# Patient Record
Sex: Male | Born: 1960 | Race: White | Hispanic: No | Marital: Married | Smoking: Former smoker
Health system: Southern US, Community
[De-identification: ages and names within clinical notes are randomized; demographics above are authoritative.]

## PROBLEM LIST (undated history)

## (undated) DIAGNOSIS — R3915 Urgency of urination: Secondary | ICD-10-CM

## (undated) DIAGNOSIS — J690 Pneumonitis due to inhalation of food and vomit: Secondary | ICD-10-CM

## (undated) DIAGNOSIS — Z8673 Personal history of transient ischemic attack (TIA), and cerebral infarction without residual deficits: Secondary | ICD-10-CM

## (undated) DIAGNOSIS — J309 Allergic rhinitis, unspecified: Secondary | ICD-10-CM

## (undated) DIAGNOSIS — Z8619 Personal history of other infectious and parasitic diseases: Secondary | ICD-10-CM

## (undated) DIAGNOSIS — J189 Pneumonia, unspecified organism: Secondary | ICD-10-CM

## (undated) DIAGNOSIS — Z87442 Personal history of urinary calculi: Secondary | ICD-10-CM

## (undated) DIAGNOSIS — K625 Hemorrhage of anus and rectum: Secondary | ICD-10-CM

## (undated) DIAGNOSIS — Z8709 Personal history of other diseases of the respiratory system: Secondary | ICD-10-CM

## (undated) DIAGNOSIS — Z8679 Personal history of other diseases of the circulatory system: Secondary | ICD-10-CM

## (undated) DIAGNOSIS — Y95 Nosocomial condition: Secondary | ICD-10-CM

## (undated) DIAGNOSIS — A0472 Enterocolitis due to Clostridium difficile, not specified as recurrent: Secondary | ICD-10-CM

## (undated) DIAGNOSIS — N200 Calculus of kidney: Secondary | ICD-10-CM

## (undated) DIAGNOSIS — Z86718 Personal history of other venous thrombosis and embolism: Secondary | ICD-10-CM

## (undated) DIAGNOSIS — I480 Paroxysmal atrial fibrillation: Secondary | ICD-10-CM

## (undated) DIAGNOSIS — K529 Noninfective gastroenteritis and colitis, unspecified: Secondary | ICD-10-CM

## (undated) DIAGNOSIS — R188 Other ascites: Secondary | ICD-10-CM

## (undated) DIAGNOSIS — Z87448 Personal history of other diseases of urinary system: Secondary | ICD-10-CM

## (undated) DIAGNOSIS — G7281 Critical illness myopathy: Secondary | ICD-10-CM

## (undated) HISTORY — PX: TRANSTHORACIC ECHOCARDIOGRAM: SHX275

## (undated) HISTORY — PX: OTHER SURGICAL HISTORY: SHX169

## (undated) HISTORY — DX: Noninfective gastroenteritis and colitis, unspecified: K52.9

## (undated) HISTORY — DX: Personal history of transient ischemic attack (TIA), and cerebral infarction without residual deficits: Z86.73

## (undated) HISTORY — DX: Hemorrhage of anus and rectum: K62.5

## (undated) HISTORY — DX: Paroxysmal atrial fibrillation: I48.0

---

## 2001-10-05 ENCOUNTER — Ambulatory Visit (HOSPITAL_COMMUNITY): Admission: RE | Admit: 2001-10-05 | Discharge: 2001-10-05 | Payer: Self-pay | Admitting: Internal Medicine

## 2002-06-13 ENCOUNTER — Ambulatory Visit (HOSPITAL_COMMUNITY): Admission: RE | Admit: 2002-06-13 | Discharge: 2002-06-13 | Payer: Self-pay | Admitting: Internal Medicine

## 2004-08-19 ENCOUNTER — Emergency Department (HOSPITAL_COMMUNITY): Admission: EM | Admit: 2004-08-19 | Discharge: 2004-08-19 | Payer: Self-pay | Admitting: *Deleted

## 2004-09-06 ENCOUNTER — Ambulatory Visit: Payer: Self-pay | Admitting: Internal Medicine

## 2005-10-19 ENCOUNTER — Ambulatory Visit: Payer: Self-pay | Admitting: Internal Medicine

## 2005-11-09 ENCOUNTER — Ambulatory Visit: Payer: Self-pay | Admitting: Internal Medicine

## 2009-10-22 LAB — CBC AND DIFFERENTIAL
HCT: 41 % (ref 41–53)
Hemoglobin: 14.4 g/dL (ref 13.5–17.5)

## 2010-02-01 ENCOUNTER — Ambulatory Visit
Admission: RE | Admit: 2010-02-01 | Discharge: 2010-02-01 | Payer: Self-pay | Source: Home / Self Care | Attending: Internal Medicine | Admitting: Internal Medicine

## 2010-06-11 NOTE — Op Note (Signed)
NAME:  Connor Small, Connor Small                          ACCOUNT NO.:  [id number]   MEDICAL RECORD NO.:  [id number]                   PATIENT TYPE:  AMB   LOCATION:  DAY                                  FACILITY:  [facility]   PHYSICIAN:  Lionel December, M.D.                 DATE OF BIRTH:  04-13-1960   DATE OF PROCEDURE:  06/13/2002  DATE OF DISCHARGE:                                 OPERATIVE REPORT   PROCEDURE:  Flexible sigmoidoscopy.   INDICATIONS FOR PROCEDURE:  The patient is a 50 year old Caucasian male who  presents with a two-month history of bloody diarrhea.  He is suspected to  have colitis.  He did have a colonoscopy in September of 2003 with a two-  week history of hematochezia.  At that time he was having normal stools.  He  had a hyperplastic polyp at the rectosigmoid junction and hemorrhoids.  Otherwise, it was a normal exam.  He was treated with Anusol suppositories,  with resolution of his bleeding.  He has not taken any antibiotics recently.  He requested IV sedation.   The procedure was reviewed with the patient, and informed consent was  obtained.   PREOPERATIVE MEDICATIONS:  Demerol 25 mg IV, Versed 3 mg IV.   INSTRUMENT USED:  The Olympus video system.   FINDINGS:  The procedure was performed in the endoscopy suite.  The  patient's vital signs and O2 saturations were monitored during the procedure  and remained stable.  The patient was placed in the left lateral recumbent  position and rectal examination performed.  No abnormality noted on external  or digital exam.  The scope was placed in the rectum.  The mucosa was noted  to be diffusely friable, erythematous, with multiple ulcers.  These changes  were felt to be typical of UC.  Similar changes were noted in the sigmoid  colon, with demarcation at 40 cm from the anal margin.  The mucosa above  that was normal.  Multiple pictures were taken for the record.  Stool  samples were obtained and sent to the lab for culture  and O&P.  A biopsy was  taken from the sigmoid colon and rectum, and the endoscope was withdrawn.  The patient tolerated the procedure well.   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    RECOMMENDATIONS:  Will start him on Rowasa enema, one per rectum at bedtime  for a total of 30 days.  I will be calling with the results of the biopsy  and stool studies.  At that time, we will start him on oral mesalamine.  Lionel December, M.D.    NR/MEDQ  D:  06/13/2002  T:  06/13/2002  Job:  [id number]   cc:   Angus G. Renard Matter, M.D.  [address]  [place]  [place] [address]  Fax: [phone]

## 2010-09-20 ENCOUNTER — Encounter (INDEPENDENT_AMBULATORY_CARE_PROVIDER_SITE_OTHER): Payer: Self-pay | Admitting: *Deleted

## 2010-09-20 ENCOUNTER — Encounter (INDEPENDENT_AMBULATORY_CARE_PROVIDER_SITE_OTHER): Payer: Self-pay

## 2010-11-06 ENCOUNTER — Emergency Department (HOSPITAL_COMMUNITY)
Admission: EM | Admit: 2010-11-06 | Discharge: 2010-11-06 | Disposition: A | Discharge status: Other Acute Inpatient Hospital | Payer: BC Managed Care – PPO | Attending: Emergency Medicine | Admitting: Emergency Medicine

## 2010-11-06 ENCOUNTER — Encounter (HOSPITAL_COMMUNITY): Payer: Self-pay | Admitting: *Deleted

## 2010-11-06 ENCOUNTER — Emergency Department (HOSPITAL_COMMUNITY): Payer: BC Managed Care – PPO

## 2010-11-06 DIAGNOSIS — K631 Perforation of intestine (nontraumatic): Secondary | ICD-10-CM | POA: Insufficient documentation

## 2010-11-06 DIAGNOSIS — R197 Diarrhea, unspecified: Secondary | ICD-10-CM | POA: Insufficient documentation

## 2010-11-06 DIAGNOSIS — K5190 Ulcerative colitis, unspecified, without complications: Secondary | ICD-10-CM | POA: Insufficient documentation

## 2010-11-06 DIAGNOSIS — R231 Pallor: Secondary | ICD-10-CM | POA: Insufficient documentation

## 2010-11-06 LAB — URINALYSIS, ROUTINE W REFLEX MICROSCOPIC
Leukocytes, UA: NEGATIVE
Nitrite: NEGATIVE
Protein, ur: 100 mg/dL — AB
Specific Gravity, Urine: >1.03 — ABNORMAL HIGH (ref 1.005–1.030)
Urobilinogen, UA: 2 mg/dL — ABNORMAL HIGH (ref 0.0–1.0)

## 2010-11-06 LAB — COMPREHENSIVE METABOLIC PANEL
ALT: 16 U/L (ref 0–53)
AST: 19 U/L (ref 0–37)
Albumin: 3.4 g/dL — ABNORMAL LOW (ref 3.5–5.2)
Alkaline Phosphatase: 109 U/L (ref 39–117)
BUN: 18 mg/dL (ref 6–23)
Chloride: 102 mEq/L (ref 96–112)
Potassium: 3.7 mEq/L (ref 3.5–5.1)
Sodium: 138 mEq/L (ref 135–145)
Total Protein: 6.8 g/dL (ref 6.0–8.3)

## 2010-11-06 LAB — CBC
MCHC: 34.4 g/dL (ref 30.0–36.0)
Platelets: 143 10*3/uL — ABNORMAL LOW (ref 150–400)
RDW: 13.2 % (ref 11.5–15.5)
WBC: 12.7 10*3/uL — ABNORMAL HIGH (ref 4.0–10.5)

## 2010-11-06 LAB — URINE MICROSCOPIC-ADD ON

## 2010-11-06 MED ORDER — CIPROFLOXACIN IN D5W 400 MG/200ML IV SOLN
400.0000 mg | Freq: Once | INTRAVENOUS | Status: AC
  Administered 2010-11-06: 400 mg via INTRAVENOUS
  Filled 2010-11-06: qty 200

## 2010-11-06 MED ORDER — MORPHINE SULFATE 4 MG/ML IJ SOLN
4.0000 mg | Freq: Once | INTRAMUSCULAR | Status: AC
  Administered 2010-11-06: 4 mg via INTRAVENOUS
  Filled 2010-11-06: qty 1

## 2010-11-06 MED ORDER — ACETAMINOPHEN 10 MG/ML IV SOLN: 1000.0000 mg | Freq: Once | INTRAVENOUS | Status: DC

## 2010-11-06 MED ORDER — SODIUM CHLORIDE 0.9 % IV BOLUS (SEPSIS)
1000.0000 mL | Freq: Once | INTRAVENOUS | Status: AC
  Administered 2010-11-06: 1000 mL via INTRAVENOUS

## 2010-11-06 MED ORDER — ACETAMINOPHEN 10 MG/ML IV SOLN
1000.0000 mg | Freq: Four times a day (QID) | INTRAVENOUS | Status: DC
  Administered 2010-11-06: 1000 mg via INTRAVENOUS

## 2010-11-06 MED ORDER — ONDANSETRON HCL 4 MG/2ML IJ SOLN
4.0000 mg | Freq: Once | INTRAMUSCULAR | Status: AC
  Administered 2010-11-06: 4 mg via INTRAVENOUS
  Filled 2010-11-06: qty 2

## 2010-11-06 MED ORDER — ACETAMINOPHEN 10 MG/ML IV SOLN
INTRAVENOUS | Status: AC
  Filled 2010-11-06: qty 100

## 2010-11-06 MED ORDER — METRONIDAZOLE IN NACL 5-0.79 MG/ML-% IV SOLN
500.0000 mg | Freq: Once | INTRAVENOUS | Status: AC
  Administered 2010-11-06: 500 mg via INTRAVENOUS
  Filled 2010-11-06: qty 100

## 2010-11-06 MED ORDER — MORPHINE SULFATE 4 MG/ML IJ SOLN
4.0000 mg | Freq: Once | INTRAMUSCULAR | Status: AC
  Administered 2010-11-06: 4 mg via INTRAVENOUS

## 2010-11-06 MED ORDER — MORPHINE SULFATE 4 MG/ML IJ SOLN
4.0000 mg | Freq: Once | INTRAMUSCULAR | Status: DC
  Filled 2010-11-06: qty 1

## 2010-11-06 MED ORDER — IOHEXOL 300 MG/ML  SOLN
100.0000 mL | Freq: Once | INTRAMUSCULAR | Status: AC | PRN
  Administered 2010-11-06: 100 mL via INTRAVENOUS

## 2010-11-06 MED ORDER — PROMETHAZINE HCL 25 MG/ML IJ SOLN
INTRAMUSCULAR | Status: AC
  Administered 2010-11-06: 25 mg
  Filled 2010-11-06: qty 1

## 2010-11-06 MED ORDER — SODIUM CHLORIDE 0.9 % IV SOLN
INTRAVENOUS | Status: DC
  Administered 2010-11-06: 12:00:00 via INTRAVENOUS

## 2010-11-06 NOTE — ED Notes (Signed)
Patient returned from CT at this time.

## 2010-11-06 NOTE — ED Notes (Signed)
Patient c/o feeling hot. Temperature 100.3 orally. Dr Tomi Bamberger made aware.

## 2010-11-06 NOTE — ED Notes (Signed)
Pt c/o lower abdominal pain and blood in his urine since Thursday night. Pt states that he had a fever Thursday night but did not take it. Pt denies dysuria or frequency. Pt also c/o vomiting on Thursday but none since.

## 2010-11-06 NOTE — ED Provider Notes (Cosign Needed)
History     CSN: [id number] Arrival date & time: 11/06/2010 10:15 AM  Chief Complaint  Patient presents with  . Abdominal Pain    (Consider location/radiation/quality/duration/timing/severity/associated sxs/prior treatment) Patient is a 50 y.o. male presenting with abdominal pain.  Abdominal Pain The primary symptoms of the illness include abdominal pain. Dysuria:  he sttes he has seen blood in his urine. He denies pain in his flank.   patient relates he has a history of colitis but when he has a flareup that is usually associated with pain in his rectal area. He states he's never had abdominal pain with it. He states his last attack was about a year ago. He also states he's never had to be admitted to the hospital for it. Patient states he started getting a different abdominal pain 2 evenings ago he has pain in his lower abdomen that he described initially as a soreness. He has had nausea of vomiting and now is having dry heaves. His wife states he was having chills and he felt hot but did not check his temperature. He denies diarrhea and states of his colitis he usually had bloody diarrhea. He has had loss of appetite and states that walking or movement makes the pain worse. He states nothing makes it feel better.  States he has seen blood in his urine. He denies pain in his flank.   Past Medical History  Diagnosis Date  . Chronic diarrhea   . Rectal bleed   . Ulcerative colitis   . Hemorrhoids     No PCP Dr Karilyn Cota, GI  Past Surgical History  Procedure Date  . Sigmoidoscopy [birth year]31-1960  . Colonoscopy 9/03    Family History  Problem Relation Age of Onset  . Cancer Sister     History  Substance Use Topics  . Smoking status: Former Games developer  . Smokeless tobacco: Not on file  . Alcohol Use: No   employed Lives with spouse   Review of Systems  Gastrointestinal: Positive for abdominal pain.  Genitourinary: Dysuria:  he sttes he has seen blood in his urine. He denies pain  in his flank.  All other systems reviewed and are negative.    Allergies  Penicillins  Home Medications   Current Outpatient Rx  Name Route Sig Dispense Refill  . MESALAMINE 800 MG PO TBEC Oral Take 2 tablets by mouth 2 (two) times daily.       BP 122/76  Pulse 101  Temp(Src) 98.9 F (37.2 C) (Oral)  Resp 18  Ht 5' 9.5" (1.765 m)  Wt 197 lb (89.359 kg)  BMI 28.67 kg/m2  SpO2 99%  Physical Exam  Vitals reviewed. Constitutional: He is oriented to person, place, and time. He appears well-developed and well-nourished.  HENT:  Head: Normocephalic and atraumatic.  Eyes: Conjunctivae and EOM are normal. Pupils are equal, round, and reactive to light.  Neck: Normal range of motion. Neck supple.  Cardiovascular: Normal rate, regular rhythm and normal heart sounds.   Pulmonary/Chest: Effort normal and breath sounds normal.  Abdominal: Soft. Bowel sounds are normal. There is tenderness.       Patient has diffuse tenderness in his lower abdomen there appears to be most tender in his right lower quadrant. He does not haveRovsky's sign. He has some guarding but does not have rebound yet.   Musculoskeletal: Normal range of motion.  Neurological: He is alert and oriented to person, place, and time. He has normal reflexes.  Skin: Skin is warm and dry. There  is pallor.  Psychiatric: He has a normal mood and affect. His behavior is normal. Thought content normal.    ED Course  Procedures (including critical care time)   Results for orders placed during the hospital encounter of 11/06/10  URINALYSIS, ROUTINE W REFLEX MICROSCOPIC      Component Value Range   Color, Urine AMBER (*) YELLOW    Appearance CLOUDY (*) CLEAR    Specific Gravity, Urine >1.030 (*) 1.005 - 1.030    pH 6.0  5.0 - 8.0    Glucose, UA NEGATIVE  NEGATIVE (mg/dL)   Hgb urine dipstick LARGE (*) NEGATIVE    Bilirubin Urine SMALL (*) NEGATIVE    Ketones, ur 40 (*) NEGATIVE (mg/dL)   Protein, ur 409 (*) NEGATIVE  (mg/dL)   Urobilinogen, UA 2.0 (*) 0.0 - 1.0 (mg/dL)   Nitrite NEGATIVE  NEGATIVE    Leukocytes, UA NEGATIVE  NEGATIVE   CBC      Component Value Range   WBC 12.7 (*) 4.0 - 10.5 (K/uL)   RBC 4.87  4.22 - 5.81 (MIL/uL)   Hemoglobin 15.8  13.0 - 17.0 (g/dL)   HCT 81.1  91.4 - 78.2 (%)   MCV 94.3  78.0 - 100.0 (fL)   MCH 32.4  26.0 - 34.0 (pg)   MCHC 34.4  30.0 - 36.0 (g/dL)   RDW 95.6  21.3 - 08.6 (%)   Platelets 143 (*) 150 - 400 (K/uL)  URINE MICROSCOPIC-ADD ON      Component Value Range   Squamous Epithelial / LPF RARE  RARE    WBC, UA 0-2  <3 (WBC/hpf)   RBC / HPF 21-50  <3 (RBC/hpf)   Bacteria, UA FEW (*) RARE    Ct Abdomen Pelvis W Contrast  11/06/2010  *RADIOLOGY REPORT*  Clinical Data: Lower abdominal pain.  Appendicitis.  CT ABDOMEN AND PELVIS WITH CONTRAST  Technique:  Multidetector CT imaging of the abdomen and pelvis was performed following the standard protocol during bolus administration of intravenous contrast.  Contrast: OMNIPAQUE IOHEXOL 300 MG/ML IV SOLN  Comparison: None  Findings: Atelectasis noted in both lung bases.  There is no focal liver abnormality.  The gallbladder appears normal.  No biliary dilatation.  The pancreas appears normal.  The spleen appears normal.  Both adrenal glands are normal.  Pancreas is unremarkable.  Normal appearance of the right kidney.  Left renal cysts.  No pathologically enlarged upper abdominal lymph nodes.  There is no enlarged pelvic or inguinal lymph nodes.  The stomach appears normal.  The small bowel loops are prominent measuring up to 3 cm.  The appendix is identified and appears normal.  There is a large extraluminal collection of gas and stool adjacent to the rectum which this likely the site of bowel perforation. This measures 4.0 x 3.6 x 4.8 cm.  A moderate to marked amount of free intraperitoneal air is identified within the upper abdomen which likely originates from the source.  There is a small amount of free fluid  identified within the pelvis.  IMPRESSION:  1.  Large extraluminal collection of gas and stool adjacent to the rectum consistent with perforation. 2.  Free intraperitoneal air throughout the abdomen and pelvis.  3.  Increased caliber of small bowel loops likely reflecting paralytic ileus.  Critical Value/emergent results were called by telephone at the time of interpretation on 11/06/2010  at 12:07 p.m.  to  Dr. Lynelle Doctor, who verbally acknowledged these results.  Original Report Authenticated By: Simonne Martinet.  Bradly Chris, M.D.   Patient given IV pain and nausea medications he was started on Cipro and Flagyl IV. He developed a temperature while using the emergency department he was given IV ofirmev (intravenous acetaminophen)  since we did not want to give him anything orally or rectally.  Impression #1 bowel perforation #2 ulcerative colitis  12:02 Dr Bradly Chris, radiology called CT results, has free air, normal appendix, ? From his rectum  12:23 Dr Lovell Sheehan feels he should be referred to [facility] b/o his history of ulcerative colitis.  1:15 Dr Minta Balsam states he has no beds and will not have one soon, suggests trying [facility].   1:36 Dr Luisa Hart, refuses transfer states can be done here.   1:57 Cr Lovell Sheehan, asks to call [facility] and if not call him back  2:44 Mardella Layman, [place] at [facility] states the trauma doctor states to call the transfer center and talk to the ED physcian.   2:47 secretary advised to call [facility] ED for transfer  15:34 Dr Lum Babe, ED Attending accepts in transfer to [facility] 15:38 Dr Drusen, Surgery on call at [facility] given information.   15:56 [facility] states no ambulance available. EMS is here to transport a STEMI, will check with [facility] and if unavailable will call back [facility] to get helicopter transport.   16:05 EMS unable to transport, have called [facility] back to get helicopter.  Patient and wife aware and are agreeable to transfer.   Patient will be sent with copies of his CT scan by disc.  MDM   CRITICAL  CARE Performed by: Devoria Albe L   Total critical care time  60  Critical care time was exclusive of separately billable procedures and treating other patients.  Critical care was necessary to treat or prevent imminent or life-threatening deterioration.  Critical care was time spent personally by me on the following activities: development of treatment plan with patient and/or surrogate as well as nursing, discussions with consultants, evaluation of patient's response to treatment, examination of patient, obtaining history from patient or surrogate, ordering and performing treatments and interventions, ordering and review of laboratory studies, ordering and review of radiographic studies, pulse oximetry and re-evaluation of patient's condition.   Devoria Albe, MD, FACEP      Ward Givens, MD 11/06/10 1545  Ward Givens, MD 11/06/10 1610  Ward Givens, MD 11/06/10 1702  Ward Givens, MD 11/29/10 1537

## 2010-11-06 NOTE — ED Notes (Signed)
Patient provided warm blanket.

## 2010-11-06 NOTE — ED Notes (Signed)
Patient left with flight crew from [facility]. Report given to flight nurse and patient care transferred to their care. Patient left ED in NAD. Report called to [facility] ED as well.

## 2010-11-09 ENCOUNTER — Ambulatory Visit (INDEPENDENT_AMBULATORY_CARE_PROVIDER_SITE_OTHER): Payer: Self-pay | Admitting: Internal Medicine

## 2010-12-06 ENCOUNTER — Ambulatory Visit (INDEPENDENT_AMBULATORY_CARE_PROVIDER_SITE_OTHER): Payer: BC Managed Care – PPO | Admitting: Internal Medicine

## 2010-12-06 ENCOUNTER — Encounter (INDEPENDENT_AMBULATORY_CARE_PROVIDER_SITE_OTHER): Payer: Self-pay | Admitting: Internal Medicine

## 2010-12-06 VITALS — BP 102/70 | HR 68 | Temp 98.7°F | Ht 70.0 in | Wt 190.0 lb

## 2010-12-06 DIAGNOSIS — K5120 Ulcerative (chronic) proctitis without complications: Secondary | ICD-10-CM

## 2010-12-06 DIAGNOSIS — K5190 Ulcerative colitis, unspecified, without complications: Secondary | ICD-10-CM

## 2010-12-06 MED ORDER — MESALAMINE 800 MG PO TBEC: 2.0000 | DELAYED_RELEASE_TABLET | Freq: Two times a day (BID) | ORAL | Status: DC

## 2010-12-06 NOTE — Patient Instructions (Signed)
Continue Asacol HD at current dose.

## 2010-12-06 NOTE — Progress Notes (Signed)
Presenting complaint; followup for ulcerative colitis and recent emergency surgery for colonic perforation. Subjective; patient is 50 year old Caucasian male who has history of distal UC diagnosed in 2004 who was last seen in January 2012 and treated for flareup of ulcerative colitis and responded to 4 weeks of mesalamine enema and a higher dose of Asacol. He was doing fine until the evening of 11/04/2010 and he noted headache and took 2 tablets of Aleve without relief of his headache; he subsequently abdominal pain develop diaphoresis and chills. By the next morning he felt fine and went to work; however later that day his abdominal pain returns with chills and diaphoresis and he was up all night. He came to the emergency room on 11/06/2010 and on workup found to have pneumoperitoneum. Because of his IBD history he was transferred to [facility] and underwent emergency surgery that night. He had resection of segment of the sigmoid colon along with colostomy with uneventful recovery. Patient was told that his colitis was in remission and that cause of colonic perforation was not clear. He has visit planned in January 2013 right 2 takedown of his colostomy. He is getting his strength back. He is able to walk more. He reports no problems with his colostomy he is passing thick brown stool. He denies melena or bleeding. He has noted some rectal discharge but is always clear. He has a good appetite. Current medications. Asacol HD 1.6 g by mouth twice a day. Objective BP 102/70  Pulse 68  Temp 98.7 F (37.1 C)  Ht 5' 10"  (1.778 m)  Wt 190 lb (86.183 kg)  BMI 27.26 kg/m2 Conjunctiva is pink sclera is nonicteric. Oropharyngeal mucosa is normal. No neck masses or thyromegaly noted. Lungs are clear to auscultation. Abdominal exam reveals colostomy in left lower quadrant. There is soft stool in colostomy bag. Well-healed midline scar. Abdomen is soft and nontender without organomegaly or masses. No  peripheral edema or clubbing noted. Assessment Recent emergency surgery for acute abdomen secondary to sigmoid colonic perforation. However no cause of perforation established.  No evidence of active UC or neoplasm. However I have not seen any records from [facility]. Plan 1. New prescription given for Asacol HD 3 months supply with 3 refills. 2. Will request records from [facility]. 3. He should undergo preop colonoscopy for takedown of his colostomy which she can have hair or at [facility]. 4. We will help patient with his disability papers 5. Office visit tentatively planned in 6 months.

## 2010-12-13 ENCOUNTER — Telehealth (INDEPENDENT_AMBULATORY_CARE_PROVIDER_SITE_OTHER): Payer: Self-pay | Admitting: Internal Medicine

## 2010-12-13 NOTE — Telephone Encounter (Signed)
I called patient to let her know that I have reviewed records from [facility]. He had rectal perforation along the anterior wall just above the peritoneal reflection. Path shows perforation but no cause established. He did not have a diverticulum active colitis or tumor. I did call Dr. Payton Doughty  but could not talk with her. Patient should have a colonoscopy prior to take down his colostomy and he would let us know if he'll have in [place] or at [facility] after his next visit with them

## 2011-02-02 ENCOUNTER — Telehealth (INDEPENDENT_AMBULATORY_CARE_PROVIDER_SITE_OTHER): Payer: Self-pay | Admitting: *Deleted

## 2011-02-02 ENCOUNTER — Other Ambulatory Visit (INDEPENDENT_AMBULATORY_CARE_PROVIDER_SITE_OTHER): Payer: Self-pay | Admitting: *Deleted

## 2011-02-02 DIAGNOSIS — K5120 Ulcerative (chronic) proctitis without complications: Secondary | ICD-10-CM

## 2011-02-02 NOTE — Telephone Encounter (Signed)
Requesting MD:     PCP:    Name: Connor Small  DOB: 11/21/1960    Procedure: TCS  Reason/Indication:  Ulcerative colitis--procedure prior to take down of colostomy  Has patient had this procedure before?    If so, when, by whom and where?    Is there a family history of colon cancer?  no  Who?  What age when diagnosed?    Is patient diabetic?   no      Does patient have prosthetic heart valve?  no  Do you have a pacemaker?  no  Has patient had joint replacement within last 12 months?  no  Is patient on Coumadin, Plavix and/or Aspirin? no  Medications: Asachol  Allergies: pcn  Pharmacy:   Medication Adjustment: none  Other:   Procedure date & time: 02/16/11 @ 1:00 (12:00)

## 2011-02-03 NOTE — Telephone Encounter (Signed)
agree

## 2011-02-04 MED ORDER — PEG-KCL-NACL-NASULF-NA ASC-C 100 G PO SOLR: 1.0000 | Freq: Once | ORAL | Status: DC

## 2011-02-04 NOTE — Telephone Encounter (Signed)
agree

## 2011-02-15 ENCOUNTER — Encounter (HOSPITAL_COMMUNITY): Payer: Self-pay | Admitting: Pharmacy Technician

## 2011-02-15 MED ORDER — SODIUM CHLORIDE 0.45 % IV SOLN
Freq: Once | INTRAVENOUS | Status: AC
  Administered 2011-02-16: 1000 mL via INTRAVENOUS

## 2011-02-16 ENCOUNTER — Ambulatory Visit (HOSPITAL_COMMUNITY)
Admission: RE | Admit: 2011-02-16 | Discharge: 2011-02-16 | Disposition: A | Discharge status: Home / Self Care | Payer: BC Managed Care – PPO | Source: Ambulatory Visit | Attending: Internal Medicine | Admitting: Internal Medicine

## 2011-02-16 ENCOUNTER — Other Ambulatory Visit (INDEPENDENT_AMBULATORY_CARE_PROVIDER_SITE_OTHER): Payer: Self-pay | Admitting: Internal Medicine

## 2011-02-16 ENCOUNTER — Encounter (HOSPITAL_COMMUNITY)
Admission: RE | Disposition: A | Discharge status: Home / Self Care | Payer: Self-pay | Source: Ambulatory Visit | Attending: Internal Medicine

## 2011-02-16 ENCOUNTER — Encounter (HOSPITAL_COMMUNITY): Payer: Self-pay | Admitting: *Deleted

## 2011-02-16 DIAGNOSIS — K5190 Ulcerative colitis, unspecified, without complications: Secondary | ICD-10-CM

## 2011-02-16 DIAGNOSIS — K5180 Other ulcerative colitis without complications: Secondary | ICD-10-CM | POA: Insufficient documentation

## 2011-02-16 DIAGNOSIS — Z01818 Encounter for other preprocedural examination: Secondary | ICD-10-CM

## 2011-02-16 DIAGNOSIS — Z933 Colostomy status: Secondary | ICD-10-CM | POA: Insufficient documentation

## 2011-02-16 DIAGNOSIS — D126 Benign neoplasm of colon, unspecified: Secondary | ICD-10-CM

## 2011-02-16 DIAGNOSIS — K6289 Other specified diseases of anus and rectum: Secondary | ICD-10-CM

## 2011-02-16 DIAGNOSIS — Z9049 Acquired absence of other specified parts of digestive tract: Secondary | ICD-10-CM | POA: Insufficient documentation

## 2011-02-16 HISTORY — PX: COLONOSCOPY: SHX5424

## 2011-02-16 SURGERY — COLONOSCOPY
Anesthesia: Moderate Sedation

## 2011-02-16 MED ORDER — MEPERIDINE HCL 50 MG/ML IJ SOLN
INTRAMUSCULAR | Status: DC | PRN
  Administered 2011-02-16: 25 mg via INTRAVENOUS

## 2011-02-16 MED ORDER — MIDAZOLAM HCL 5 MG/5ML IJ SOLN
INTRAMUSCULAR | Status: DC | PRN
  Administered 2011-02-16 (×2): 2 mg via INTRAVENOUS
  Administered 2011-02-16: 1 mg via INTRAVENOUS

## 2011-02-16 MED ORDER — MEPERIDINE HCL 50 MG/ML IJ SOLN
INTRAMUSCULAR | Status: AC
  Filled 2011-02-16: qty 1

## 2011-02-16 MED ORDER — STERILE WATER FOR IRRIGATION IR SOLN
Status: DC | PRN
  Administered 2011-02-16: 13:00:00

## 2011-02-16 MED ORDER — MIDAZOLAM HCL 5 MG/5ML IJ SOLN
INTRAMUSCULAR | Status: AC
  Filled 2011-02-16: qty 10

## 2011-02-16 NOTE — Op Note (Signed)
COLONOSCOPY PROCEDURE REPORT  PATIENT:  Connor Small  MR#:  [id number] Birthdate:  12/20/1960, 51 y.o., male Endoscopist:  Dr. Rogene Houston, MD Procedure Date: 02/16/2011 Procedure:   Colonoscopy  Indications:  Patient is 51 year old Caucasian male her 9 year history of distal ulcerative colitis was remained in remission while on oral mesalamine who presented with a sigmoid colon perforation in October 2012 and underwent resection of part of the sigmoid colon along with sigmoid colostomy. He is scheduled to have his colostomy reversed next month. He is undergoing pre-procedure colonoscopy primarily for surveillance purposes given history of chronic UC. His last colonoscopy was in September 2003.  Informed Consent:  Procedure and risks were reviewed with the patient and informed consent was obtained. Medications:  Demerol 25 mg IV Versed 5 mg IV  Description of procedure:   Patient's vital signs and O2 sat were monitored during the procedure and remained stable. Procedure was performed in 2 stages. For the first stage patient was placed in left lateral recumbent position and rectal examination performed. No abnormality noted on external or digital exam. Pentax videoscope was placed in the rectum the mucosa revealed patchy scarring or mucosal granularity with a friability and a few tiny dictates erosions mostly involving distal half of the rectum. Mucosa the proximal rectum and stump was normal. Biopsy was taken from rectal mucosa endoscope was withdrawn. No rectal junction with was unremarkable as examined by retroflexion of scope. For the second stage patient was turned into supine position. Digital examination of colostomy was normal Pentax videoscope was placed via colostomy and the sigmoid colon and advanced to cecum without any difficulty. Short segment of terminal ileum was also examined and was normal. As the scope was withdrawn colonic mucosa was carefully examined and findings noted as  below.    Findings:   Prep excellent. Normal mucosa of terminal ileum for 15 cm. 3 mm polyp ablated via cold biopsy from cecum. 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  Therapeutic/Diagnostic Maneuvers Performed:  See above  Complications:  None  Cecal Withdrawal Time:  10 minutes  Impression:  Normal terminal ileum. 3 mm  polyp ablated via cold biopsy from cecum. Mild proctitis. Suspect these changes may be secondary to diversion colitis. Biopsy taken for routine histology. No evidence of active ulcerative colitis.  Recommendations:  I will be contacting patient with results of biopsy and further recommendations. No contraindication to a reversal of sigmoid colostomy.  Tedrick Port U  02/16/2011 1:14 PM

## 2011-02-16 NOTE — H&P (Signed)
Connor Small is an 51 y.o. male.   Chief Complaint: Patient is here for colonoscopy. HPI: Patient is 51 year old Caucasian male who was diagnosed distal ulcerative colitis in 2004. He presented in October last year with acute abdomen. He was transferred to [facility] chart. He had laparotomy with action of sigmoid colon and sigmoid colostomy. He is getting ready to have his colostomy taken down. He is undergoing preop evaluation. Cause of his sigmoid perforation could never be determined. He had no evidence of diverticulitis active colitis or tumor.  Past Medical History  Diagnosis Date  . Chronic diarrhea   . Rectal bleed   . Hemorrhoids   . Ulcerative colitis     Distal UC over 8 yrs ago diagnosed  . Diverticulitis of large intestine with perforation 10/2011    done at [place]    Past Surgical History  Procedure Date  . Colonoscopy 9/03  . Sigmoidoscopy      06/13/2002  . Temporary ostomy November 06, 2010     for a colon perforation that was done in [facility] (Dr Payton Doughty).      Family History  Problem Relation Age of Onset  . Cancer Sister    Social History:  reports that he has quit smoking. He does not have any smokeless tobacco history on file. He reports that he does not drink alcohol or use illicit drugs.  Allergies:  Allergies  Allergen Reactions  . Penicillins Other (See Comments)    Heart rate changes    Medications Prior to Admission  Medication Dose Route Frequency Provider Last Rate Last Dose  . 0.45 % sodium chloride infusion   Intravenous Once Rogene Houston, MD 20 mL/hr at 02/16/11 1219 1,000 mL at 02/16/11 1219  . meperidine (DEMEROL) 50 MG/ML injection           . midazolam (VERSED) 5 MG/5ML injection            Medications Prior to Admission  Medication Sig Dispense Refill  . ibuprofen (ADVIL,MOTRIN) 200 MG tablet Take 400 mg by mouth every 6 (six) hours as needed. For pain      . Mesalamine (ASACOL HD) 800 MG TBEC Take 2 tablets (1,600 mg total) by  mouth 2 (two) times daily.  180 tablet  3  . peg 3350 powder (MOVIPREP) 100 G SOLR Take 1 kit (100 g total) by mouth once.  1 kit  0    No results found for this or any previous visit (from the past 48 hour(s)). No results found.  Review of Systems  Constitutional: Negative for weight loss.  Gastrointestinal: Negative for abdominal pain, diarrhea, constipation, blood in stool and melena.    Blood pressure 110/73, pulse 64, temperature 97.8 F (36.6 C), temperature source Oral, resp. rate 18, height 5' 9.5" (1.765 m), weight 186 lb (84.369 kg), SpO2 98.00%. Physical Exam  Constitutional: He appears well-developed and well-nourished.  HENT:  Mouth/Throat: Oropharynx is clear and moist.  Eyes: Conjunctivae are normal. No scleral icterus.  Neck: No thyromegaly present.  GI: Soft. He exhibits no mass. There is no tenderness. There is no guarding.       Colostomy in the left low quadrant of the abdomen and well-healed midline scar  Musculoskeletal: He exhibits no edema.  Lymphadenopathy:    He has no cervical adenopathy.  Neurological: He is alert.  Skin: Skin is warm and dry.     Assessment/Plan Chronic ulcerative colitis. History of sigmoid colon perforation of unknown etiology. Preop  colonoscopy and examination of the rectal stump prior to takedown of sigmoid colostomy   REHMAN,NAJEEB U 02/16/2011, 12:34 PM

## 2011-02-28 ENCOUNTER — Encounter (HOSPITAL_COMMUNITY): Payer: Self-pay | Admitting: Internal Medicine

## 2011-05-30 ENCOUNTER — Encounter (INDEPENDENT_AMBULATORY_CARE_PROVIDER_SITE_OTHER): Payer: Self-pay | Admitting: *Deleted

## 2011-07-11 ENCOUNTER — Ambulatory Visit (INDEPENDENT_AMBULATORY_CARE_PROVIDER_SITE_OTHER): Payer: BC Managed Care – PPO | Admitting: Internal Medicine

## 2011-07-12 ENCOUNTER — Encounter (INDEPENDENT_AMBULATORY_CARE_PROVIDER_SITE_OTHER): Payer: Self-pay

## 2011-07-18 ENCOUNTER — Encounter (INDEPENDENT_AMBULATORY_CARE_PROVIDER_SITE_OTHER): Payer: Self-pay | Admitting: Internal Medicine

## 2011-07-18 ENCOUNTER — Ambulatory Visit (INDEPENDENT_AMBULATORY_CARE_PROVIDER_SITE_OTHER): Payer: BC Managed Care – PPO | Admitting: Internal Medicine

## 2011-07-18 VITALS — BP 116/70 | HR 74 | Temp 97.4°F | Resp 20 | Ht 69.0 in | Wt 198.7 lb

## 2011-07-18 DIAGNOSIS — K5190 Ulcerative colitis, unspecified, without complications: Secondary | ICD-10-CM

## 2011-07-18 MED ORDER — ALIGN PO CAPS: 1.0000 | ORAL_CAPSULE | Freq: Every day | ORAL | Status: DC

## 2011-07-18 NOTE — Patient Instructions (Addendum)
You can take Align 1 capsule daily or another probiotic.

## 2011-07-18 NOTE — Progress Notes (Signed)
Presenting complaint;  Followup for ulcerative colitis.  Subjective:  Connor Small is 51 year old Caucasian male who has almost 10 year history of ulcerative colitis and has remained in remission. Last October he underwent emergency surgery at [facility] for colonic perforation. He had colostomy which was taken down on 04/17/2011. On his initial surgery he did not have active colitis or tumor. He has had no problems since his surgery. He has returned to work. He is having 2-3 formed stools daily. He denies abdominal pain melena or rectal bleeding. One month ago he developed left red eye. He was seen by optometrist and treated with prednisone drop. He was told that this initially was most likely related to UC.  Current Medications: Current Outpatient Prescriptions  Medication Sig Dispense Refill  . ibuprofen (ADVIL,MOTRIN) 200 MG tablet Take 400 mg by mouth every 6 (six) hours as needed. For pain      . Mesalamine (ASACOL HD) 800 MG TBEC Take 2 tablets (1,600 mg total) by mouth 2 (two) times daily.  180 tablet  3     Objective: Blood pressure 116/70, pulse 74, temperature 97.4 F (36.3 C), temperature source Oral, resp. rate 20, height 5' 9"  (1.753 m), weight 198 lb 11.2 oz (90.13 kg). Patient appears to be in no acute distress Conjunctiva is pink. Sclera is nonicteric Oropharyngeal mucosa is normal. No neck masses or thyromegaly noted. Cardiac exam with regular rhythm normal S1 and S2. No murmur or gallop noted. Lungs are clear to auscultation. Abdomen abdomen is somewhat asymmetric with fullness in the right lower quadrant; midline scar along the scar at prior colostomy site at [facility]. Abdomen is soft and nontender without organomegaly or masses.  No LE edema or clubbing noted.  Assessment:  Chronic ulcerative colitis. He remains in remission. 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 Cause of colonic perforation could never be established in therefore remains a mystery.    Plan:  Continue Asacol HD at 1.6 g by mouth twice a day. Patient aware to use OTC NSAIDs only sparingly. Align 1 capsule by mouth daily. Samples given. Office visit in 6 months.

## 2012-01-16 ENCOUNTER — Encounter (INDEPENDENT_AMBULATORY_CARE_PROVIDER_SITE_OTHER): Payer: Self-pay | Admitting: Internal Medicine

## 2012-01-16 ENCOUNTER — Ambulatory Visit (INDEPENDENT_AMBULATORY_CARE_PROVIDER_SITE_OTHER): Payer: BC Managed Care – PPO | Admitting: Internal Medicine

## 2012-01-16 VITALS — BP 110/68 | HR 74 | Temp 98.0°F | Resp 20 | Ht 69.0 in | Wt 204.1 lb

## 2012-01-16 DIAGNOSIS — K5120 Ulcerative (chronic) proctitis without complications: Secondary | ICD-10-CM

## 2012-01-16 MED ORDER — MESALAMINE 800 MG PO TBEC: 2.0000 | DELAYED_RELEASE_TABLET | Freq: Two times a day (BID) | ORAL | Status: DC

## 2012-01-16 NOTE — Patient Instructions (Addendum)
Please bring [place] a copy of your blood work from her employer when available. Can take Pentasa 1 g by mouth 4 times a day if you do not receive Asacol HD  prescription on time.

## 2012-01-16 NOTE — Progress Notes (Signed)
Presenting complaint;  Followup for ulcerative colitis.  Subjective:  Connor Small is 51 year old Caucasian male who is here for scheduled visit. He was last seen 6 months ago. He has UC which was diagnosed in May 2004. He presented fourteen months ago with perforated colon and underwent segmental resection with colostomy which was subsequently taken down in March, 2013. He had colonoscopy prior to that procedure and he was in remission. He has no complaints. He generally has 2-3 formed stools per day. He denies urgency or frank rectal bleeding. He has occasional hematochezia. He also has occasional fleeting gasping. He has history of hemorrhoids. He did have blood work to his employer 2-1/2 weeks ago but has not received a copy yet. He has very good appetite. He has gained 6 pounds since his last visit. He takes ibuprofen no more than couple of times a month.  Current Medications: Current Outpatient Prescriptions  Medication Sig Dispense Refill  . bifidobacterium infantis (ALIGN) capsule Take 1 capsule by mouth daily.  14 capsule  0  . FORTESTA 10 MG/ACT (2%) GEL at bedtime. Patient states that he applies it to his leg      . ibuprofen (ADVIL,MOTRIN) 200 MG tablet Take 400 mg by mouth every 6 (six) hours as needed. For pain      . Mesalamine (ASACOL HD) 800 MG TBEC Take 2 tablets (1,600 mg total) by mouth 2 (two) times daily.  180 tablet  3     Objective: Blood pressure 110/68, pulse 74, temperature 98 F (36.7 C), temperature source Oral, resp. rate 20, height 5' 9"  (1.753 m), weight 204 lb 1.6 oz (92.579 kg). Conjunctiva is pink. Sclera is nonicteric Oropharyngeal mucosa is normal. No neck masses or thyromegaly noted. Cardiac exam with regular rhythm normal S1 and S2. No murmur or gallop noted. Lungs are clear to auscultation. Abdomen. He has lower midline and left horizontal scar from prior surgery. His abdomen is soft and nontender without organomegaly or masses.  No LE edema or clubbing  noted.   Assessment:  Distal ulcerative colitis of almost 10 years duration. He is in remission. Loss of colonic perforation could never be established for which she had segmental resection colostomy followed by reversal of colostomy. Both these surgeries were done at [facility]. Had colonoscopy in January this year and his disease was quiescent.    Plan:  Patient will bring [place] a copy of his blood work for review. Prescription for Asacol HD 3 months along with 3 refills. He was also given a supply of Pentasa so that there would be no interruption in treatment. Unless he has problems he'll return for office visit in one year. Will consider next colonoscopy in January 2018.

## 2012-02-16 ENCOUNTER — Telehealth (INDEPENDENT_AMBULATORY_CARE_PROVIDER_SITE_OTHER): Payer: Self-pay | Admitting: *Deleted

## 2012-02-16 NOTE — Telephone Encounter (Signed)
Connor Small just received his refill of Asacol 800 mg from his mail order pharmacy. The prescription should have been for #360 and it was for #180. Please send in a corrected rx to [facility]. Connor Small's return phone number is  [phone].

## 2012-02-23 NOTE — Telephone Encounter (Signed)
A correction prescription to include the correct quanity was called to [facility]. Asacol HD 800 mg- The patient is to take 2 tablets by mouth twice a day #360 with 3 refills. Patient will be notified by me as well as [facility].

## 2012-02-26 ENCOUNTER — Encounter (HOSPITAL_COMMUNITY): Payer: Self-pay | Admitting: Emergency Medicine

## 2012-02-26 ENCOUNTER — Inpatient Hospital Stay (HOSPITAL_COMMUNITY)
Admission: EM | Admit: 2012-02-26 | Discharge: 2012-04-17 | DRG: 585 | Disposition: A | Discharge status: Rehabilitation Facility | Payer: BC Managed Care – PPO | Attending: Internal Medicine | Admitting: Internal Medicine

## 2012-02-26 ENCOUNTER — Emergency Department (HOSPITAL_COMMUNITY): Payer: BC Managed Care – PPO

## 2012-02-26 DIAGNOSIS — J111 Influenza due to unidentified influenza virus with other respiratory manifestations: Secondary | ICD-10-CM

## 2012-02-26 DIAGNOSIS — E8809 Other disorders of plasma-protein metabolism, not elsewhere classified: Secondary | ICD-10-CM | POA: Diagnosis not present

## 2012-02-26 DIAGNOSIS — R509 Fever, unspecified: Secondary | ICD-10-CM

## 2012-02-26 DIAGNOSIS — E876 Hypokalemia: Secondary | ICD-10-CM | POA: Diagnosis not present

## 2012-02-26 DIAGNOSIS — I429 Cardiomyopathy, unspecified: Secondary | ICD-10-CM | POA: Diagnosis not present

## 2012-02-26 DIAGNOSIS — K5180 Other ulcerative colitis without complications: Secondary | ICD-10-CM | POA: Diagnosis present

## 2012-02-26 DIAGNOSIS — N179 Acute kidney failure, unspecified: Secondary | ICD-10-CM | POA: Diagnosis not present

## 2012-02-26 DIAGNOSIS — Y849 Medical procedure, unspecified as the cause of abnormal reaction of the patient, or of later complication, without mention of misadventure at the time of the procedure: Secondary | ICD-10-CM | POA: Diagnosis not present

## 2012-02-26 DIAGNOSIS — E872 Acidosis, unspecified: Secondary | ICD-10-CM | POA: Diagnosis not present

## 2012-02-26 DIAGNOSIS — S36409A Unspecified injury of unspecified part of small intestine, initial encounter: Secondary | ICD-10-CM | POA: Diagnosis not present

## 2012-02-26 DIAGNOSIS — T81329A Deep disruption or dehiscence of operation wound, unspecified, initial encounter: Secondary | ICD-10-CM | POA: Diagnosis not present

## 2012-02-26 DIAGNOSIS — E43 Unspecified severe protein-calorie malnutrition: Secondary | ICD-10-CM | POA: Diagnosis not present

## 2012-02-26 DIAGNOSIS — I82619 Acute embolism and thrombosis of superficial veins of unspecified upper extremity: Secondary | ICD-10-CM | POA: Diagnosis not present

## 2012-02-26 DIAGNOSIS — T82898A Other specified complication of vascular prosthetic devices, implants and grafts, initial encounter: Secondary | ICD-10-CM | POA: Diagnosis not present

## 2012-02-26 DIAGNOSIS — Y832 Surgical operation with anastomosis, bypass or graft as the cause of abnormal reaction of the patient, or of later complication, without mention of misadventure at the time of the procedure: Secondary | ICD-10-CM | POA: Diagnosis present

## 2012-02-26 DIAGNOSIS — S43001A Unspecified subluxation of right shoulder joint, initial encounter: Secondary | ICD-10-CM

## 2012-02-26 DIAGNOSIS — R188 Other ascites: Secondary | ICD-10-CM | POA: Diagnosis not present

## 2012-02-26 DIAGNOSIS — E860 Dehydration: Secondary | ICD-10-CM | POA: Insufficient documentation

## 2012-02-26 DIAGNOSIS — Z87891 Personal history of nicotine dependence: Secondary | ICD-10-CM

## 2012-02-26 DIAGNOSIS — R7309 Other abnormal glucose: Secondary | ICD-10-CM | POA: Diagnosis not present

## 2012-02-26 DIAGNOSIS — G9340 Encephalopathy, unspecified: Secondary | ICD-10-CM | POA: Diagnosis not present

## 2012-02-26 DIAGNOSIS — R6521 Severe sepsis with septic shock: Secondary | ICD-10-CM | POA: Diagnosis present

## 2012-02-26 DIAGNOSIS — D7389 Other diseases of spleen: Secondary | ICD-10-CM | POA: Diagnosis not present

## 2012-02-26 DIAGNOSIS — I480 Paroxysmal atrial fibrillation: Secondary | ICD-10-CM | POA: Diagnosis present

## 2012-02-26 DIAGNOSIS — E870 Hyperosmolality and hypernatremia: Secondary | ICD-10-CM | POA: Diagnosis not present

## 2012-02-26 DIAGNOSIS — K56609 Unspecified intestinal obstruction, unspecified as to partial versus complete obstruction: Secondary | ICD-10-CM | POA: Diagnosis present

## 2012-02-26 DIAGNOSIS — R112 Nausea with vomiting, unspecified: Secondary | ICD-10-CM | POA: Diagnosis not present

## 2012-02-26 DIAGNOSIS — R29898 Other symptoms and signs involving the musculoskeletal system: Secondary | ICD-10-CM

## 2012-02-26 DIAGNOSIS — R109 Unspecified abdominal pain: Secondary | ICD-10-CM

## 2012-02-26 DIAGNOSIS — A0472 Enterocolitis due to Clostridium difficile, not specified as recurrent: Principal | ICD-10-CM | POA: Diagnosis present

## 2012-02-26 DIAGNOSIS — D696 Thrombocytopenia, unspecified: Secondary | ICD-10-CM | POA: Diagnosis not present

## 2012-02-26 DIAGNOSIS — M6250 Muscle wasting and atrophy, not elsewhere classified, unspecified site: Secondary | ICD-10-CM | POA: Diagnosis not present

## 2012-02-26 DIAGNOSIS — I639 Cerebral infarction, unspecified: Secondary | ICD-10-CM

## 2012-02-26 DIAGNOSIS — I6340 Cerebral infarction due to embolism of unspecified cerebral artery: Secondary | ICD-10-CM | POA: Diagnosis not present

## 2012-02-26 DIAGNOSIS — J95821 Acute postprocedural respiratory failure: Secondary | ICD-10-CM | POA: Diagnosis not present

## 2012-02-26 DIAGNOSIS — K922 Gastrointestinal hemorrhage, unspecified: Secondary | ICD-10-CM | POA: Diagnosis not present

## 2012-02-26 DIAGNOSIS — R000 Tachycardia, unspecified: Secondary | ICD-10-CM

## 2012-02-26 DIAGNOSIS — K5190 Ulcerative colitis, unspecified, without complications: Secondary | ICD-10-CM

## 2012-02-26 DIAGNOSIS — I498 Other specified cardiac arrhythmias: Secondary | ICD-10-CM | POA: Diagnosis not present

## 2012-02-26 DIAGNOSIS — J9600 Acute respiratory failure, unspecified whether with hypoxia or hypercapnia: Secondary | ICD-10-CM | POA: Diagnosis present

## 2012-02-26 DIAGNOSIS — G8250 Quadriplegia, unspecified: Secondary | ICD-10-CM

## 2012-02-26 DIAGNOSIS — T8132XA Disruption of internal operation (surgical) wound, not elsewhere classified, initial encounter: Secondary | ICD-10-CM | POA: Diagnosis not present

## 2012-02-26 DIAGNOSIS — E871 Hypo-osmolality and hyponatremia: Secondary | ICD-10-CM | POA: Diagnosis not present

## 2012-02-26 DIAGNOSIS — K56699 Other intestinal obstruction unspecified as to partial versus complete obstruction: Secondary | ICD-10-CM

## 2012-02-26 DIAGNOSIS — S43006A Unspecified dislocation of unspecified shoulder joint, initial encounter: Secondary | ICD-10-CM | POA: Diagnosis not present

## 2012-02-26 DIAGNOSIS — J90 Pleural effusion, not elsewhere classified: Secondary | ICD-10-CM

## 2012-02-26 DIAGNOSIS — K929 Disease of digestive system, unspecified: Secondary | ICD-10-CM | POA: Diagnosis present

## 2012-02-26 DIAGNOSIS — Y836 Removal of other organ (partial) (total) as the cause of abnormal reaction of the patient, or of later complication, without mention of misadventure at the time of the procedure: Secondary | ICD-10-CM | POA: Diagnosis not present

## 2012-02-26 DIAGNOSIS — D638 Anemia in other chronic diseases classified elsewhere: Secondary | ICD-10-CM | POA: Diagnosis present

## 2012-02-26 DIAGNOSIS — K5730 Diverticulosis of large intestine without perforation or abscess without bleeding: Secondary | ICD-10-CM | POA: Diagnosis present

## 2012-02-26 DIAGNOSIS — Z9049 Acquired absence of other specified parts of digestive tract: Secondary | ICD-10-CM

## 2012-02-26 DIAGNOSIS — Z79899 Other long term (current) drug therapy: Secondary | ICD-10-CM

## 2012-02-26 DIAGNOSIS — N170 Acute kidney failure with tubular necrosis: Secondary | ICD-10-CM | POA: Diagnosis not present

## 2012-02-26 DIAGNOSIS — R17 Unspecified jaundice: Secondary | ICD-10-CM | POA: Diagnosis not present

## 2012-02-26 DIAGNOSIS — Z933 Colostomy status: Secondary | ICD-10-CM

## 2012-02-26 DIAGNOSIS — B3781 Candidal esophagitis: Secondary | ICD-10-CM | POA: Diagnosis not present

## 2012-02-26 DIAGNOSIS — K5939 Other megacolon: Secondary | ICD-10-CM | POA: Diagnosis present

## 2012-02-26 DIAGNOSIS — R1312 Dysphagia, oropharyngeal phase: Secondary | ICD-10-CM | POA: Diagnosis not present

## 2012-02-26 DIAGNOSIS — M545 Low back pain: Secondary | ICD-10-CM

## 2012-02-26 DIAGNOSIS — B370 Candidal stomatitis: Secondary | ICD-10-CM | POA: Diagnosis not present

## 2012-02-26 DIAGNOSIS — I4891 Unspecified atrial fibrillation: Secondary | ICD-10-CM | POA: Diagnosis not present

## 2012-02-26 DIAGNOSIS — E875 Hyperkalemia: Secondary | ICD-10-CM | POA: Diagnosis not present

## 2012-02-26 DIAGNOSIS — D689 Coagulation defect, unspecified: Secondary | ICD-10-CM

## 2012-02-26 DIAGNOSIS — B349 Viral infection, unspecified: Secondary | ICD-10-CM

## 2012-02-26 DIAGNOSIS — K838 Other specified diseases of biliary tract: Secondary | ICD-10-CM | POA: Diagnosis not present

## 2012-02-26 DIAGNOSIS — A419 Sepsis, unspecified organism: Secondary | ICD-10-CM | POA: Diagnosis present

## 2012-02-26 DIAGNOSIS — K5120 Ulcerative (chronic) proctitis without complications: Secondary | ICD-10-CM

## 2012-02-26 DIAGNOSIS — R6520 Severe sepsis without septic shock: Secondary | ICD-10-CM | POA: Diagnosis not present

## 2012-02-26 DIAGNOSIS — D649 Anemia, unspecified: Secondary | ICD-10-CM

## 2012-02-26 DIAGNOSIS — E2749 Other adrenocortical insufficiency: Secondary | ICD-10-CM | POA: Diagnosis not present

## 2012-02-26 DIAGNOSIS — S43001D Unspecified subluxation of right shoulder joint, subsequent encounter: Secondary | ICD-10-CM

## 2012-02-26 LAB — URINALYSIS, ROUTINE W REFLEX MICROSCOPIC
Nitrite: NEGATIVE
Urobilinogen, UA: 0.2 mg/dL (ref 0.0–1.0)
pH: 5.5 (ref 5.0–8.0)

## 2012-02-26 LAB — URINE MICROSCOPIC-ADD ON

## 2012-02-26 LAB — CBC WITH DIFFERENTIAL/PLATELET
HCT: 47.7 % (ref 39.0–52.0)
Hemoglobin: 17.2 g/dL — ABNORMAL HIGH (ref 13.0–17.0)
Lymphocytes Relative: 4 % — ABNORMAL LOW (ref 12–46)
Monocytes Absolute: 2 10*3/uL — ABNORMAL HIGH (ref 0.1–1.0)
Monocytes Relative: 15 % — ABNORMAL HIGH (ref 3–12)
Neutro Abs: 11.2 10*3/uL — ABNORMAL HIGH (ref 1.7–7.7)
WBC: 13.8 10*3/uL — ABNORMAL HIGH (ref 4.0–10.5)

## 2012-02-26 LAB — COMPREHENSIVE METABOLIC PANEL
BUN: 27 mg/dL — ABNORMAL HIGH (ref 6–23)
CO2: 20 mEq/L (ref 19–32)
Chloride: 101 mEq/L (ref 96–112)
Creatinine, Ser: 1.15 mg/dL (ref 0.50–1.35)
GFR calc non Af Amer: 71 mL/min — ABNORMAL LOW (ref >90)
Total Bilirubin: 1.3 mg/dL — ABNORMAL HIGH (ref 0.3–1.2)

## 2012-02-26 LAB — INFLUENZA PANEL BY PCR (TYPE A & B)
Influenza A By PCR: NEGATIVE
Influenza B By PCR: NEGATIVE

## 2012-02-26 LAB — LIPASE, BLOOD: Lipase: 9 U/L — ABNORMAL LOW (ref 11–59)

## 2012-02-26 LAB — TROPONIN I: Troponin I: <0.3 ng/mL (ref <0.30)

## 2012-02-26 MED ORDER — MESALAMINE 400 MG PO CPDR
1600.0000 mg | DELAYED_RELEASE_CAPSULE | Freq: Two times a day (BID) | ORAL | Status: DC
  Filled 2012-02-26 (×2): qty 4

## 2012-02-26 MED ORDER — SODIUM CHLORIDE 0.9 % IV SOLN: INTRAVENOUS | Status: DC

## 2012-02-26 MED ORDER — HYDROCODONE-ACETAMINOPHEN 5-325 MG PO TABS: 1.0000 | ORAL_TABLET | ORAL | Status: DC | PRN

## 2012-02-26 MED ORDER — IBUPROFEN 800 MG PO TABS: 400.0000 mg | ORAL_TABLET | Freq: Two times a day (BID) | ORAL | Status: DC | PRN

## 2012-02-26 MED ORDER — ENOXAPARIN SODIUM 40 MG/0.4ML ~~LOC~~ SOLN: 40.0000 mg | SUBCUTANEOUS | Status: DC

## 2012-02-26 MED ORDER — ALUM & MAG HYDROXIDE-SIMETH 200-200-20 MG/5ML PO SUSP: 30.0000 mL | Freq: Four times a day (QID) | ORAL | Status: DC | PRN

## 2012-02-26 MED ORDER — ACETAMINOPHEN 650 MG RE SUPP: 650.0000 mg | Freq: Four times a day (QID) | RECTAL | Status: DC | PRN

## 2012-02-26 MED ORDER — OSELTAMIVIR PHOSPHATE 75 MG PO CAPS
75.0000 mg | ORAL_CAPSULE | Freq: Once | ORAL | Status: AC
  Administered 2012-02-26: 75 mg via ORAL
  Filled 2012-02-26: qty 1

## 2012-02-26 MED ORDER — IOHEXOL 300 MG/ML  SOLN
50.0000 mL | Freq: Once | INTRAMUSCULAR | Status: AC | PRN
  Administered 2012-02-26: 50 mL via ORAL

## 2012-02-26 MED ORDER — SODIUM CHLORIDE 0.9 % IJ SOLN
3.0000 mL | Freq: Two times a day (BID) | INTRAMUSCULAR | Status: DC
  Administered 2012-02-26 – 2012-03-04 (×8): 3 mL via INTRAVENOUS

## 2012-02-26 MED ORDER — ONDANSETRON HCL 4 MG PO TABS: 4.0000 mg | ORAL_TABLET | Freq: Four times a day (QID) | ORAL | Status: DC | PRN

## 2012-02-26 MED ORDER — SODIUM CHLORIDE 0.9 % IV SOLN
Freq: Once | INTRAVENOUS | Status: AC
  Administered 2012-02-26: 125 mL/h via INTRAVENOUS

## 2012-02-26 MED ORDER — IOHEXOL 300 MG/ML  SOLN
100.0000 mL | Freq: Once | INTRAMUSCULAR | Status: AC | PRN
  Administered 2012-02-26: 100 mL via INTRAVENOUS

## 2012-02-26 MED ORDER — TRAZODONE HCL 50 MG PO TABS
50.0000 mg | ORAL_TABLET | Freq: Every evening | ORAL | Status: DC | PRN
  Filled 2012-02-26: qty 1

## 2012-02-26 MED ORDER — MESALAMINE 800 MG PO TBEC
2.0000 | DELAYED_RELEASE_TABLET | Freq: Two times a day (BID) | ORAL | Status: DC
  Filled 2012-02-26 (×2): qty 2

## 2012-02-26 MED ORDER — HYDROMORPHONE HCL PF 1 MG/ML IJ SOLN
0.5000 mg | INTRAMUSCULAR | Status: DC | PRN
  Administered 2012-02-26 – 2012-02-27 (×4): 0.5 mg via INTRAVENOUS
  Filled 2012-02-26 (×4): qty 1

## 2012-02-26 MED ORDER — ONDANSETRON HCL 4 MG/2ML IJ SOLN: 4.0000 mg | Freq: Three times a day (TID) | INTRAMUSCULAR | Status: DC | PRN

## 2012-02-26 MED ORDER — ACETAMINOPHEN 325 MG PO TABS
650.0000 mg | ORAL_TABLET | Freq: Once | ORAL | Status: AC
  Administered 2012-02-26: 650 mg via ORAL
  Filled 2012-02-26: qty 2

## 2012-02-26 MED ORDER — ONDANSETRON HCL 4 MG/2ML IJ SOLN
4.0000 mg | Freq: Once | INTRAMUSCULAR | Status: AC
  Administered 2012-02-26: 4 mg via INTRAVENOUS
  Filled 2012-02-26: qty 2

## 2012-02-26 MED ORDER — SODIUM CHLORIDE 0.9 % IV BOLUS (SEPSIS)
1000.0000 mL | Freq: Once | INTRAVENOUS | Status: AC
  Administered 2012-02-26: 1000 mL via INTRAVENOUS

## 2012-02-26 MED ORDER — ONDANSETRON HCL 4 MG/2ML IJ SOLN
4.0000 mg | Freq: Four times a day (QID) | INTRAMUSCULAR | Status: DC | PRN
  Administered 2012-04-01 – 2012-04-02 (×2): 4 mg via INTRAVENOUS
  Filled 2012-02-26 (×2): qty 2

## 2012-02-26 MED ORDER — ACETAMINOPHEN 325 MG PO TABS
650.0000 mg | ORAL_TABLET | Freq: Four times a day (QID) | ORAL | Status: DC | PRN
  Administered 2012-02-27: 650 mg via ORAL
  Filled 2012-02-26: qty 2

## 2012-02-26 MED ORDER — POTASSIUM CHLORIDE IN NACL 20-0.9 MEQ/L-% IV SOLN
INTRAVENOUS | Status: DC
  Administered 2012-02-26: 125 mL/h via INTRAVENOUS
  Administered 2012-02-27 (×2): via INTRAVENOUS
  Administered 2012-02-27: 125 mL/h via INTRAVENOUS
  Administered 2012-02-28: 05:00:00 via INTRAVENOUS

## 2012-02-26 MED ORDER — HYDROMORPHONE HCL PF 1 MG/ML IJ SOLN
0.5000 mg | Freq: Once | INTRAMUSCULAR | Status: AC
  Administered 2012-02-26: 0.5 mg via INTRAVENOUS
  Filled 2012-02-26: qty 1

## 2012-02-26 NOTE — ED Notes (Signed)
Pt unable to void at this time. 

## 2012-02-26 NOTE — ED Provider Notes (Signed)
History  Scribed for Connor Essex, MD, the patient was seen in room APA10/APA10. This chart was scribed by Truddie Coco. The patient's care started at 2:02 PM   CSN: [id number]  Arrival date & time 02/26/12  1338   First MD Initiated Contact with Patient 02/26/12 1359      Chief Complaint  Patient presents with  . Diarrhea  . Generalized Body Aches  . Back Pain     The history is provided by the patient. No language interpreter was used.   Connor Small is a 52 y.o. male who presents to the Emergency Department complaining of diarrhea and generalized body aches that started yesterday.  Pt is also experiencing back pain, nausea, and a fever with the highest fever being 102.5.  He denies blood in stool.  Pt has h/o ulcerative colitis which is under control.  He has had ill contacts with similar sx.  Pt reports he has been drinking normally, but has lack of appetite.    Past Medical History  Diagnosis Date  . Chronic diarrhea   . Rectal bleed   . Hemorrhoids   . Ulcerative colitis     Distal UC over 8 yrs ago diagnosed  . Diverticulitis of large intestine with perforation 10/2011    done at [place]    Past Surgical History  Procedure Date  . Colonoscopy 9/03  . Sigmoidoscopy      06/13/2002  . Temporary ostomy November 06, 2010     for a colon perforation that was done in [facility] (Dr Payton Doughty).    . Colonoscopy 02/16/2011    Procedure: COLONOSCOPY;  Surgeon: Rogene Houston, MD;  Location: [facility];  Service: Endoscopy;  Laterality: N/A;  100    Family History  Problem Relation Age of Onset  . Cancer Sister   . Healthy Daughter     History  Substance Use Topics  . Smoking status: Former Smoker    Types: Cigarettes    Quit date: 07/17/1984  . Smokeless tobacco: Never Used     Comment: Quit 23 yrs ago  . Alcohol Use: No      Review of Systems  Constitutional: Positive for fever and appetite change (lack of appetite). Negative for chills.        Generalized body aches  Respiratory: Negative for cough and shortness of breath.   Cardiovascular: Negative for chest pain.  Gastrointestinal: Positive for nausea, abdominal pain and diarrhea. Negative for vomiting.  Genitourinary: Positive for decreased urine volume. Negative for penile pain.  Musculoskeletal: Positive for back pain (lower back pain).  Skin: Negative for rash.  Neurological: Positive for dizziness. Negative for weakness.  All other systems reviewed and are negative.    Allergies  Penicillins  Home Medications   Current Outpatient Rx  Name  Route  Sig  Dispense  Refill  . ALIGN PO CAPS   Oral   Take 1 capsule by mouth daily.   14 capsule   0   . FORTESTA 10 MG/ACT (2%) TD GEL      at bedtime. Patient states that he applies it to his leg         . IBUPROFEN 200 MG PO TABS   Oral   Take 400 mg by mouth 2 (two) times daily as needed. For pain         . MESALAMINE 800 MG PO TBEC   Oral   Take 2 tablets (1,600 mg total) by mouth 2 (two) times  daily.   180 tablet   3     BP 94/59  Pulse 148  Temp 100.6 F (38.1 C)  Resp 18  Ht 5' 9.5" (1.765 m)  Wt 200 lb (90.719 kg)  BMI 29.11 kg/m2  SpO2 97%  Physical Exam  Nursing note and vitals reviewed. Constitutional: He is oriented to person, place, and time. He appears well-developed and well-nourished. No distress.  HENT:  Head: Normocephalic and atraumatic.       Dry mucous membranes.  Eyes: EOM are normal.  Neck: Normal range of motion. Neck supple. No tracheal deviation present.       No meningismus   Cardiovascular:       tachycardic  Pulmonary/Chest: Effort normal. No respiratory distress.  Abdominal: There is tenderness (diffuse tenderness). There is no rebound and no guarding.       Well healed surgical scar. No CVA tenderness.   Musculoskeletal: Normal range of motion.  Neurological: He is alert and oriented to person, place, and time.  Skin: Skin is warm and dry.  Psychiatric: He  has a normal mood and affect. His behavior is normal.    ED Course  Procedures   DIAGNOSTIC STUDIES: Oxygen Saturation is 97% on room air, normal by my interpretation.    COORDINATION OF CARE:  2:10PM Ordered: Troponin I; Lipase, blood; Comprehensive metabolic panel; CBC with Differential; Urinalysis, Routine w reflex microscopic; Lactic acid, plasa; CT Abdomen Pelvis W Contrast 2:15PM Ordered: sodium chloride 0.9% bolus 1,000 mL; ondansetron (ZOFRAN) injection 4 mg; acetaminophen (TYLENOL) tablet 650 mg   Labs Reviewed  CBC WITH DIFFERENTIAL - Abnormal; Notable for the following:    WBC 13.8 (*)     Hemoglobin 17.2 (*)     MCHC 36.1 (*)     Platelets 134 (*)     Neutrophils Relative 81 (*)     Neutro Abs 11.2 (*)     Lymphocytes Relative 4 (*)     Lymphs Abs 0.6 (*)     Monocytes Relative 15 (*)     Monocytes Absolute 2.0 (*)     All other components within normal limits  COMPREHENSIVE METABOLIC PANEL - Abnormal; Notable for the following:    Glucose, Bld 114 (*)     BUN 27 (*)     Albumin 3.1 (*)     Total Bilirubin 1.3 (*)     GFR calc non Af Amer 71 (*)     GFR calc Af Amer 82 (*)     All other components within normal limits  LIPASE, BLOOD - Abnormal; Notable for the following:    Lipase 9 (*)     All other components within normal limits  TROPONIN I  LACTIC ACID, PLASMA  URINALYSIS, ROUTINE W REFLEX MICROSCOPIC   Ct Abdomen Pelvis W Contrast  02/26/2012  *RADIOLOGY REPORT*  Clinical Data: Diarrhea, body aches, prior colon surgery with colostomy for perforation  CT ABDOMEN AND PELVIS WITH CONTRAST  Technique:  Multidetector CT imaging of the abdomen and pelvis was performed following the standard protocol during bolus administration of intravenous contrast.  Contrast: 100 ml Omnipaque-300 IV  Comparison: 11/06/2010  Findings: Lung bases are essentially clear.  Liver, spleen, pancreas, and adrenal glands are within normal limits.  Gallbladder is unremarkable.  No  intrahepatic or extrahepatic ductal dilatation.  Three left renal cysts measuring up to 1.7 cm.  Right kidney is within normal limits.  No hydronephrosis.  No evidence of bowel obstruction.  Normal appendix.  Prior distal  colonic resection with reanastomosis in the pelvis (series 2/image 74).  Moderate stool in the colon.  Atherosclerotic calcifications of the abdominal aorta and branch vessels.  No abdominopelvic ascites.  No suspicious abdominopelvic lymphadenopathy.  Small fat-containing left abdominal hernia at the site of prior colostomy (series 2/image 7).  Prostate is unremarkable.  Bladder is within normal limits.  Mild degenerative changes of the visualized thoracolumbar spine.  IMPRESSION: No evidence of bowel obstruction.  Normal appendix.  Prior distal colonic resection with reanastomosis.  Moderate stool throughout the colon, suggesting constipation.  Otherwise, no CT findings to account for the patient's abdominal pain.   Original Report Authenticated By: Julian Hy, M.D.      No diagnosis found.    MDM  2 Days of fever, diarrhea, diffuse bodyaches and chills with nausea. No vomiting. History of ulcerative colitis that is apparently in remission. Sick contacts at home.  Dry mucous membranes with tachycardia on exam. Abdomen soft.  Persistent tachycardia despite fluid resuscitation. Heart rate elevates to 150 with standing. CT scan negative for acute process. Suspect viral illness causing dehydration, fever, diarrhea and body aches. Possibly influenza. Empiric tamiflu begun.    Date: 02/26/2012  Rate: 113  Rhythm: sinus tachycardia  QRS Axis: normal  Intervals: normal  ST/T Wave abnormalities: normal  Conduction Disutrbances:none  Narrative Interpretation:   Old EKG Reviewed: none available   I personally performed the services described in this documentation, which was scribed in my presence. The recorded information has been reviewed and is  accurate.       Connor Essex, MD 02/26/12 [phone]

## 2012-02-26 NOTE — ED Notes (Signed)
Report called to Abby, RN on unit 300.

## 2012-02-26 NOTE — ED Notes (Signed)
Pt c/o diarrhea/abd pain/d/body aches/fever since yesterday. Denies n/v.

## 2012-02-26 NOTE — H&P (Signed)
Hospital Admission Note Date: 02/26/2012  Patient name: Connor Small Medical record number: [id number] Date of birth: [birth year] Age: 52 y.o. Gender: male PCP: Press photographer Complaint: back pain, fever, diarrhea  History of Present Illness:  Connor Small is an 52 y.o. male who presents with low back pain, fever to 102.5. About 10 episodes of watery diarrhea since yesterday. His symptoms started yesterday. He was exposed to a coworker with a similar illness. He was vaccinated for influenza in October. He has a history of ulcerative colitis, by CAT of the abdomen scan shows nothing acute. He has myalgias, sore throat, nausea but no vomiting. Poor appetite. In the emergency room, his temperature was 101.1 rectally. Heart rate with standing increased from 120-143. Blood pressure most recently is 97/69. No cough.  Past Medical History  Diagnosis Date  . Chronic diarrhea   . Rectal bleed   . Hemorrhoids   . Ulcerative colitis     Distal UC over 8 yrs ago diagnosed  . Diverticulitis of large intestine with perforation 10/2011    done at [facility]    Meds: See medication list  Allergies: Penicillins History   Social History  . Marital Status: Married    Spouse Name: N/A    Number of Children: N/A  . Years of Education: N/A   Occupational History  . Not on file.   Social History Main Topics  . Smoking status: Former Smoker    Types: Cigarettes    Quit date: 07/17/1984  . Smokeless tobacco: Never Used     Comment: Quit 23 yrs ago  . Alcohol Use: No  . Drug Use: No  . Sexually Active: Yes   Other Topics Concern  . Not on file   Social History Narrative  . No narrative on file   Family History  Problem Relation Age of Onset  . Cancer Sister   . Healthy Daughter    Past Surgical History  Procedure Date  . Colonoscopy 9/03  . Sigmoidoscopy      06/13/2002  . Temporary ostomy November 06, 2010     for a colon perforation that was done in [facility] (Dr Ruben Im).     . Colonoscopy 02/16/2011    Procedure: COLONOSCOPY;  Surgeon: Malissa Hippo, MD;  Location: [facility];  Service: Endoscopy;  Laterality: N/A;  100    Review of Systems: Systems reviewed and as per HPI, otherwise negative.  Physical Exam: Blood pressure 97/69, pulse 143, temperature 101.1 F (38.4 C), temperature source Rectal, resp. rate 18, height 5' 9.5" (1.765 m), weight 90.719 kg (200 lb), SpO2 97.00%. BP 97/69  Pulse 143  Temp 101.1 F (38.4 C) (Rectal)  Resp 18  Ht 5' 9.5" (1.765 m)  Wt 90.719 kg (200 lb)  BMI 29.11 kg/m2  SpO2 97%  General Appearance:    ill-appearing and uncomfortable. Oriented.   Head:    Normocephalic, without obvious abnormality, atraumatic  Eyes:    PERRL, conjunctiva/corneas clear, EOM's intact, fundi    benign, both eyes  Ears:    Normal TM's and external ear canals, both ears  Nose:   Nares normal, septum midline, mucosa normal, no drainage    or sinus tenderness  Throat:   dry mucous membranes. Oropharynx with erythema. No white plaques.   Neck:   Supple, symmetrical, trachea midline, no adenopathy;    thyroid:  no enlargement/tenderness/nodules; no carotid   bruit or JVD  Back:     Symmetric, no curvature,  ROM normal, no CVA tenderness  Lungs:     Clear to auscultation bilaterally, respirations unlabored  Chest Wall:    No tenderness or deformity   Heart:    Regular rate and rhythm, S1 and S2 normal, no murmur, rub   or gallop     Abdomen:     Soft, non-tender, bowel sounds active all four quadrants,    no masses, no organomegaly  Genitalia:   deferred   Rectal:   deferred   Extremities:   Extremities normal, atraumatic, no cyanosis or edema  Pulses:   2+ and symmetric all extremities  Skin:   feels warm, flushed   Lymph nodes:   Cervical, supraclavicular, and axillary nodes normal  Neurologic:   CNII-XII intact, normal strength, sensation and reflexes    throughout    Psychiatric:  Normal affect  Lab results: Basic Metabolic  Panel:  Basename 02/26/12 1409  NA 135  K 3.7  CL 101  CO2 20  GLUCOSE 114*  BUN 27*  CREATININE 1.15  CALCIUM 8.4  MG --  PHOS --   Liver Function Tests:  Basename 02/26/12 1409  AST 14  ALT 14  ALKPHOS 59  BILITOT 1.3*  PROT 6.4  ALBUMIN 3.1*    Basename 02/26/12 1409  LIPASE 9*  AMYLASE --   No results found for this basename: AMMONIA:2 in the last 72 hours CBC:  Basename 02/26/12 1409  WBC 13.8*  NEUTROABS 11.2*  HGB 17.2*  HCT 47.7  MCV 91.7  PLT 134*   Cardiac Enzymes:  Basename 02/26/12 1409  CKTOTAL --  CKMB --  CKMBINDEX --  TROPONINI <0.30     Basename 02/26/12 1627  COLORURINE YELLOW  LABSPEC 1.020  PHURINE 5.5  GLUCOSEU NEGATIVE  HGBUR LARGE*  BILIRUBINUR SMALL*  KETONESUR TRACE*  PROTEINUR 100*  UROBILINOGEN 0.2  NITRITE NEGATIVE  LEUKOCYTESUR NEGATIVE   Imaging results:  Ct Abdomen Pelvis W Contrast  02/26/2012  *RADIOLOGY REPORT*  Clinical Data: Diarrhea, body aches, prior colon surgery with colostomy for perforation  CT ABDOMEN AND PELVIS WITH CONTRAST  Technique:  Multidetector CT imaging of the abdomen and pelvis was performed following the standard protocol during bolus administration of intravenous contrast.  Contrast: 100 ml Omnipaque-300 IV  Comparison: 11/06/2010  Findings: Lung bases are essentially clear.  Liver, spleen, pancreas, and adrenal glands are within normal limits.  Gallbladder is unremarkable.  No intrahepatic or extrahepatic ductal dilatation.  Three left renal cysts measuring up to 1.7 cm.  Right kidney is within normal limits.  No hydronephrosis.  No evidence of bowel obstruction.  Normal appendix.  Prior distal colonic resection with reanastomosis in the pelvis (series 2/image 74).  Moderate stool in the colon.  Atherosclerotic calcifications of the abdominal aorta and branch vessels.  No abdominopelvic ascites.  No suspicious abdominopelvic lymphadenopathy.  Small fat-containing left abdominal hernia at the site  of prior colostomy (series 2/image 7).  Prostate is unremarkable.  Bladder is within normal limits.  Mild degenerative changes of the visualized thoracolumbar spine.  IMPRESSION: No evidence of bowel obstruction.  Normal appendix.  Prior distal colonic resection with reanastomosis.  Moderate stool throughout the colon, suggesting constipation.  Otherwise, no CT findings to account for the patient's abdominal pain.   Original Report Authenticated By: Charline Bills, M.D.     Assessment & Plan: Active Problems:  Influenza-like illness  Dehydration  UC (ulcerative colitis)  Patient will be monitored overnight on telemetry. IV fluids will be given and supportive care.  He has received a dose of Tamiflu which I will continue pending his influenza PCR. DVT prophylaxis. Reassess tomorrow morning.  Asha Grumbine L 02/26/2012, 5:32 PM

## 2012-02-27 ENCOUNTER — Inpatient Hospital Stay (HOSPITAL_COMMUNITY): Payer: BC Managed Care – PPO | Admitting: Anesthesiology

## 2012-02-27 ENCOUNTER — Encounter (HOSPITAL_COMMUNITY): Payer: Self-pay | Admitting: Anesthesiology

## 2012-02-27 ENCOUNTER — Encounter (HOSPITAL_COMMUNITY)
Admission: EM | Disposition: A | Discharge status: Rehabilitation Facility | Payer: Self-pay | Source: Home / Self Care | Attending: Pulmonary Disease

## 2012-02-27 ENCOUNTER — Inpatient Hospital Stay (HOSPITAL_COMMUNITY): Payer: BC Managed Care – PPO

## 2012-02-27 ENCOUNTER — Encounter (HOSPITAL_COMMUNITY): Payer: Self-pay

## 2012-02-27 ENCOUNTER — Observation Stay (HOSPITAL_COMMUNITY): Payer: BC Managed Care – PPO

## 2012-02-27 DIAGNOSIS — R52 Pain, unspecified: Secondary | ICD-10-CM

## 2012-02-27 DIAGNOSIS — R109 Unspecified abdominal pain: Secondary | ICD-10-CM

## 2012-02-27 DIAGNOSIS — M545 Low back pain, unspecified: Secondary | ICD-10-CM | POA: Insufficient documentation

## 2012-02-27 DIAGNOSIS — A0472 Enterocolitis due to Clostridium difficile, not specified as recurrent: Secondary | ICD-10-CM

## 2012-02-27 DIAGNOSIS — K289 Gastrojejunal ulcer, unspecified as acute or chronic, without hemorrhage or perforation: Secondary | ICD-10-CM

## 2012-02-27 DIAGNOSIS — R509 Fever, unspecified: Secondary | ICD-10-CM

## 2012-02-27 DIAGNOSIS — K5190 Ulcerative colitis, unspecified, without complications: Secondary | ICD-10-CM

## 2012-02-27 HISTORY — PX: LAPAROTOMY: SHX154

## 2012-02-27 HISTORY — PX: FLEXIBLE SIGMOIDOSCOPY: SHX5431

## 2012-02-27 HISTORY — PX: CECOSTOMY: SHX1316

## 2012-02-27 LAB — BASIC METABOLIC PANEL
BUN: 22 mg/dL (ref 6–23)
Chloride: 104 mEq/L (ref 96–112)
GFR calc Af Amer: >90 mL/min (ref >90)
Potassium: 4.4 mEq/L (ref 3.5–5.1)
Sodium: 134 mEq/L — ABNORMAL LOW (ref 135–145)

## 2012-02-27 LAB — HEPATIC FUNCTION PANEL
ALT: 13 U/L (ref 0–53)
AST: 14 U/L (ref 0–37)
Alkaline Phosphatase: 61 U/L (ref 39–117)
Bilirubin, Direct: 0.3 mg/dL (ref 0.0–0.3)
Indirect Bilirubin: 0.6 mg/dL (ref 0.3–0.9)
Total Bilirubin: 0.9 mg/dL (ref 0.3–1.2)

## 2012-02-27 LAB — CBC WITH DIFFERENTIAL/PLATELET
Basophils Relative: 0 % (ref 0–1)
HCT: 47.9 % (ref 39.0–52.0)
Hemoglobin: 17.1 g/dL — ABNORMAL HIGH (ref 13.0–17.0)
Lymphocytes Relative: 6 % — ABNORMAL LOW (ref 12–46)
Lymphs Abs: 0.6 10*3/uL — ABNORMAL LOW (ref 0.7–4.0)
MCHC: 35.7 g/dL (ref 30.0–36.0)
Monocytes Absolute: 1.5 10*3/uL — ABNORMAL HIGH (ref 0.1–1.0)
Monocytes Relative: 14 % — ABNORMAL HIGH (ref 3–12)
Neutro Abs: 8.2 10*3/uL — ABNORMAL HIGH (ref 1.7–7.7)
Neutrophils Relative %: 80 % — ABNORMAL HIGH (ref 43–77)
RBC: 5.16 MIL/uL (ref 4.22–5.81)

## 2012-02-27 LAB — LACTIC ACID, PLASMA: Lactic Acid, Venous: 3.8 mmol/L — ABNORMAL HIGH (ref 0.5–2.2)

## 2012-02-27 SURGERY — SIGMOIDOSCOPY, FLEXIBLE
Anesthesia: Moderate Sedation

## 2012-02-27 SURGERY — LAPAROTOMY, EXPLORATORY
Anesthesia: General | Site: Abdomen | Wound class: Dirty or Infected

## 2012-02-27 MED ORDER — LIDOCAINE HCL (CARDIAC) 10 MG/ML IV SOLN
INTRAVENOUS | Status: DC | PRN
  Administered 2012-02-27: 50 mg via INTRAVENOUS

## 2012-02-27 MED ORDER — ROCURONIUM BROMIDE 50 MG/5ML IV SOLN
INTRAVENOUS | Status: AC
  Filled 2012-02-27: qty 1

## 2012-02-27 MED ORDER — SODIUM CHLORIDE 0.9 % IV BOLUS (SEPSIS)
500.0000 mL | Freq: Once | INTRAVENOUS | Status: AC
  Administered 2012-02-27: 500 mL via INTRAVENOUS

## 2012-02-27 MED ORDER — METOPROLOL TARTRATE 1 MG/ML IV SOLN
INTRAVENOUS | Status: AC
  Filled 2012-02-27: qty 5

## 2012-02-27 MED ORDER — HYDROMORPHONE HCL PF 1 MG/ML IJ SOLN
INTRAMUSCULAR | Status: AC
  Filled 2012-02-27: qty 1

## 2012-02-27 MED ORDER — LACTATED RINGERS IV SOLN
INTRAVENOUS | Status: DC | PRN
  Administered 2012-02-27 (×3): via INTRAVENOUS

## 2012-02-27 MED ORDER — METRONIDAZOLE IN NACL 5-0.79 MG/ML-% IV SOLN: 500.0000 mg | Freq: Once | INTRAVENOUS | Status: DC

## 2012-02-27 MED ORDER — ROCURONIUM BROMIDE 100 MG/10ML IV SOLN
INTRAVENOUS | Status: DC | PRN
  Administered 2012-02-27: 30 mg via INTRAVENOUS
  Administered 2012-02-27: 10 mg via INTRAVENOUS
  Administered 2012-02-27: 20 mg via INTRAVENOUS
  Administered 2012-02-27: 10 mg via INTRAVENOUS

## 2012-02-27 MED ORDER — METOPROLOL TARTRATE 1 MG/ML IV SOLN
INTRAVENOUS | Status: DC | PRN
  Administered 2012-02-27: 2 mg via INTRAVENOUS
  Administered 2012-02-27 (×3): 1 mg via INTRAVENOUS

## 2012-02-27 MED ORDER — NEOSTIGMINE METHYLSULFATE 1 MG/ML IJ SOLN
INTRAMUSCULAR | Status: AC
  Filled 2012-02-27: qty 1

## 2012-02-27 MED ORDER — METRONIDAZOLE IN NACL 5-0.79 MG/ML-% IV SOLN
INTRAVENOUS | Status: AC
  Filled 2012-02-27: qty 100

## 2012-02-27 MED ORDER — SUFENTANIL CITRATE 50 MCG/ML IV SOLN
INTRAVENOUS | Status: AC
  Filled 2012-02-27: qty 1

## 2012-02-27 MED ORDER — MEPERIDINE HCL 50 MG/ML IJ SOLN
INTRAMUSCULAR | Status: AC
  Filled 2012-02-27: qty 1

## 2012-02-27 MED ORDER — LIDOCAINE HCL (PF) 1 % IJ SOLN
INTRAMUSCULAR | Status: AC
  Filled 2012-02-27: qty 5

## 2012-02-27 MED ORDER — ETOMIDATE 2 MG/ML IV SOLN
INTRAVENOUS | Status: DC | PRN
  Administered 2012-02-27: 12 mg via INTRAVENOUS

## 2012-02-27 MED ORDER — LACTATED RINGERS IV SOLN
INTRAVENOUS | Status: DC
  Administered 2012-02-27: 1000 mL via INTRAVENOUS

## 2012-02-27 MED ORDER — SODIUM CHLORIDE 0.9 % IJ SOLN
INTRAMUSCULAR | Status: AC
  Filled 2012-02-27: qty 10

## 2012-02-27 MED ORDER — MIDAZOLAM HCL 5 MG/5ML IJ SOLN
INTRAMUSCULAR | Status: DC | PRN
  Administered 2012-02-27: 2 mg via INTRAVENOUS

## 2012-02-27 MED ORDER — CIPROFLOXACIN IN D5W 400 MG/200ML IV SOLN
INTRAVENOUS | Status: DC | PRN
  Administered 2012-02-27: 400 mg via INTRAVENOUS

## 2012-02-27 MED ORDER — SUCCINYLCHOLINE CHLORIDE 20 MG/ML IJ SOLN
INTRAMUSCULAR | Status: AC
  Filled 2012-02-27: qty 1

## 2012-02-27 MED ORDER — HYDROMORPHONE HCL PF 1 MG/ML IJ SOLN
0.5000 mg | INTRAMUSCULAR | Status: DC | PRN
  Administered 2012-02-27 – 2012-02-28 (×8): 1 mg via INTRAVENOUS
  Filled 2012-02-27 (×8): qty 1

## 2012-02-27 MED ORDER — LACTATED RINGERS IV SOLN
INTRAVENOUS | Status: DC | PRN
  Administered 2012-02-27: 16:00:00 via INTRAVENOUS

## 2012-02-27 MED ORDER — CIPROFLOXACIN IN D5W 400 MG/200ML IV SOLN: 400.0000 mg | Freq: Once | INTRAVENOUS | Status: DC

## 2012-02-27 MED ORDER — ENOXAPARIN SODIUM 40 MG/0.4ML ~~LOC~~ SOLN
40.0000 mg | Freq: Once | SUBCUTANEOUS | Status: AC
  Administered 2012-02-27: 40 mg via SUBCUTANEOUS

## 2012-02-27 MED ORDER — METRONIDAZOLE IN NACL 5-0.79 MG/ML-% IV SOLN
500.0000 mg | Freq: Four times a day (QID) | INTRAVENOUS | Status: DC
  Administered 2012-02-27 – 2012-03-05 (×28): 500 mg via INTRAVENOUS
  Filled 2012-02-27 (×32): qty 100

## 2012-02-27 MED ORDER — VANCOMYCIN 50 MG/ML ORAL SOLUTION
250.0000 mg | Freq: Four times a day (QID) | ORAL | Status: DC
  Administered 2012-02-27 – 2012-02-28 (×2): 250 mg via ORAL
  Filled 2012-02-27 (×8): qty 5

## 2012-02-27 MED ORDER — MIDAZOLAM HCL 2 MG/2ML IJ SOLN
INTRAMUSCULAR | Status: AC
  Filled 2012-02-27: qty 2

## 2012-02-27 MED ORDER — METRONIDAZOLE IN NACL 5-0.79 MG/ML-% IV SOLN: 500.0000 mg | Freq: Three times a day (TID) | INTRAVENOUS | Status: DC

## 2012-02-27 MED ORDER — SUFENTANIL CITRATE 50 MCG/ML IV SOLN
INTRAVENOUS | Status: DC | PRN
  Administered 2012-02-27 (×2): 10 ug via INTRAVENOUS
  Administered 2012-02-27: 15 ug via INTRAVENOUS
  Administered 2012-02-27: 20 ug via INTRAVENOUS
  Administered 2012-02-27: 25 ug via INTRAVENOUS
  Administered 2012-02-27: 10 ug via INTRAVENOUS

## 2012-02-27 MED ORDER — VANCOMYCIN HCL 500 MG IV SOLR
500.0000 mg | Freq: Once | INTRAVENOUS | Status: AC
  Administered 2012-02-27: 500 mg via INTRAVENOUS
  Filled 2012-02-27: qty 500

## 2012-02-27 MED ORDER — ETOMIDATE 2 MG/ML IV SOLN
INTRAVENOUS | Status: AC
  Filled 2012-02-27: qty 10

## 2012-02-27 MED ORDER — LACTATED RINGERS IV SOLN
INTRAVENOUS | Status: DC
  Administered 2012-02-27 (×2): 1000 mL via INTRAVENOUS

## 2012-02-27 MED ORDER — ENOXAPARIN SODIUM 40 MG/0.4ML ~~LOC~~ SOLN
SUBCUTANEOUS | Status: AC
  Filled 2012-02-27: qty 0.4

## 2012-02-27 MED ORDER — SODIUM CHLORIDE 0.9 % IR SOLN
Status: DC | PRN
  Administered 2012-02-27 (×3): 1000 mL

## 2012-02-27 MED ORDER — MIDAZOLAM HCL 5 MG/5ML IJ SOLN
INTRAMUSCULAR | Status: AC
  Administered 2012-02-28: 2 mg via INTRAVENOUS
  Filled 2012-02-27: qty 10

## 2012-02-27 MED ORDER — SUCCINYLCHOLINE CHLORIDE 20 MG/ML IJ SOLN
INTRAMUSCULAR | Status: DC | PRN
  Administered 2012-02-27: 120 mg via INTRAVENOUS

## 2012-02-27 MED ORDER — VANCOMYCIN HCL 500 MG IV SOLR: 500.0000 mg | Freq: Once | INTRAVENOUS | Status: DC

## 2012-02-27 MED ORDER — MEPERIDINE HCL 25 MG/ML IJ SOLN
INTRAMUSCULAR | Status: DC | PRN
  Administered 2012-02-27: 25 mg via INTRAVENOUS

## 2012-02-27 MED ORDER — HYDROMORPHONE HCL PF 1 MG/ML IJ SOLN
1.0000 mg | Freq: Once | INTRAMUSCULAR | Status: AC
  Administered 2012-02-27: 1 mg via INTRAVENOUS

## 2012-02-27 MED ORDER — MUPIROCIN 2 % EX OINT
TOPICAL_OINTMENT | CUTANEOUS | Status: AC
  Filled 2012-02-27: qty 22

## 2012-02-27 MED ORDER — MUPIROCIN 2 % EX OINT
TOPICAL_OINTMENT | Freq: Two times a day (BID) | CUTANEOUS | Status: DC
  Administered 2012-02-27: 1 via NASAL

## 2012-02-27 MED ORDER — CIPROFLOXACIN IN D5W 400 MG/200ML IV SOLN
INTRAVENOUS | Status: AC
  Filled 2012-02-27: qty 200

## 2012-02-27 MED ORDER — MIDAZOLAM HCL 5 MG/5ML IJ SOLN
INTRAMUSCULAR | Status: DC | PRN
  Administered 2012-02-27 (×2): 2 mg via INTRAVENOUS

## 2012-02-27 MED ORDER — METOPROLOL TARTRATE 1 MG/ML IV SOLN
2.5000 mg | Freq: Once | INTRAVENOUS | Status: AC
  Administered 2012-02-28: 2.5 mg via INTRAVENOUS

## 2012-02-27 MED ORDER — SODIUM CHLORIDE 0.9 % IV SOLN: INTRAVENOUS | Status: DC

## 2012-02-27 MED ORDER — CIPROFLOXACIN IN D5W 400 MG/200ML IV SOLN: 400.0000 mg | Freq: Two times a day (BID) | INTRAVENOUS | Status: DC

## 2012-02-27 MED ORDER — NEOSTIGMINE METHYLSULFATE 1 MG/ML IJ SOLN
INTRAMUSCULAR | Status: DC | PRN
  Administered 2012-02-27: 3 mg via INTRAVENOUS

## 2012-02-27 MED ORDER — SODIUM CHLORIDE 0.9 % IV BOLUS (SEPSIS): 1000.0000 mL | Freq: Once | INTRAVENOUS | Status: DC

## 2012-02-27 MED ORDER — STERILE WATER FOR IRRIGATION IR SOLN
Status: DC | PRN
  Administered 2012-02-27: 14:00:00

## 2012-02-27 MED ORDER — VANCOMYCIN HCL 500 MG IV SOLR: 500.0000 mg | INTRAVENOUS | Status: DC

## 2012-02-27 MED ORDER — PANTOPRAZOLE SODIUM 40 MG IV SOLR
40.0000 mg | INTRAVENOUS | Status: DC
  Administered 2012-02-27: 40 mg via INTRAVENOUS
  Filled 2012-02-27 (×2): qty 40

## 2012-02-27 MED ORDER — METRONIDAZOLE IN NACL 5-0.79 MG/ML-% IV SOLN
INTRAVENOUS | Status: DC | PRN
  Administered 2012-02-27: 500 mg via INTRAVENOUS

## 2012-02-27 SURGICAL SUPPLY — 65 items
APPLIER CLIP 11 MED OPEN (CLIP)
APPLIER CLIP 13 LRG OPEN (CLIP)
BAG HAMPER (MISCELLANEOUS) ×2 IMPLANT
BAG URINE DRAINAGE (UROLOGICAL SUPPLIES) ×2 IMPLANT
BARRIER SKIN 2 3/4 (OSTOMY) IMPLANT
CATH ROBINSON RED A/P 18FR (CATHETERS) ×2 IMPLANT
CELLS DAT CNTRL 66122 CELL SVR (MISCELLANEOUS) IMPLANT
CLAMP POUCH DRAINAGE QUIET (OSTOMY) IMPLANT
CLIP APPLIE 11 MED OPEN (CLIP) IMPLANT
CLIP APPLIE 13 LRG OPEN (CLIP) IMPLANT
CLOTH BEACON ORANGE TIMEOUT ST (SAFETY) ×2 IMPLANT
CONNECTOR 5 IN 1 STRAIGHT STRL (MISCELLANEOUS) ×2 IMPLANT
COVER LIGHT HANDLE STERIS (MISCELLANEOUS) ×4 IMPLANT
DRAPE WARM FLUID 44X44 (DRAPE) ×2 IMPLANT
DURAPREP 26ML APPLICATOR (WOUND CARE) ×2 IMPLANT
ELECT BLADE 6 FLAT ULTRCLN (ELECTRODE) ×2 IMPLANT
ELECT REM PT RETURN 9FT ADLT (ELECTROSURGICAL) ×2
ELECTRODE REM PT RTRN 9FT ADLT (ELECTROSURGICAL) ×1 IMPLANT
GLOVE BIOGEL PI IND STRL 7.0 (GLOVE) ×2 IMPLANT
GLOVE BIOGEL PI IND STRL 7.5 (GLOVE) ×3 IMPLANT
GLOVE BIOGEL PI INDICATOR 7.0 (GLOVE) ×2
GLOVE BIOGEL PI INDICATOR 7.5 (GLOVE) ×3
GLOVE ECLIPSE 7.0 STRL STRAW (GLOVE) ×6 IMPLANT
GLOVE EXAM NITRILE MD LF STRL (GLOVE) ×2 IMPLANT
GLOVE SS BIOGEL STRL SZ 6.5 (GLOVE) ×2 IMPLANT
GLOVE SUPERSENSE BIOGEL SZ 6.5 (GLOVE) ×2
GOWN PREVENTION PLUS XLARGE (GOWN DISPOSABLE) ×2 IMPLANT
GOWN STRL REIN XL XLG (GOWN DISPOSABLE) ×8 IMPLANT
INST SET MAJOR GENERAL (KITS) ×2 IMPLANT
KIT ROOM TURNOVER APOR (KITS) ×2 IMPLANT
MANIFOLD NEPTUNE II (INSTRUMENTS) ×2 IMPLANT
NS IRRIG 1000ML POUR BTL (IV SOLUTION) ×6 IMPLANT
PACK ABDOMINAL MAJOR (CUSTOM PROCEDURE TRAY) ×2 IMPLANT
PAD ARMBOARD 7.5X6 YLW CONV (MISCELLANEOUS) ×2 IMPLANT
PENCIL HANDSWITCHING (ELECTRODE) ×2 IMPLANT
POUCH OSTOMY 2 3/4  H 3804 (WOUND CARE)
POUCH OSTOMY 2 PC DRNBL 2.75 (WOUND CARE) IMPLANT
RELOAD LINEAR CUT PROX 55 BLUE (ENDOMECHANICALS) IMPLANT
RELOAD PROXIMATE 75MM BLUE (ENDOMECHANICALS) IMPLANT
RETRACTOR WND ALEXIS 25 LRG (MISCELLANEOUS) IMPLANT
RTRCTR WOUND ALEXIS 18CM MED (MISCELLANEOUS)
RTRCTR WOUND ALEXIS 25CM LRG (MISCELLANEOUS)
SEALER TISSUE G2 CVD JAW 35 (ENDOMECHANICALS) IMPLANT
SEALER TISSUE G2 CVD JAW 45CM (ENDOMECHANICALS)
SEPRAFILM MEMBRANE 5X6 (MISCELLANEOUS) ×2 IMPLANT
SET BASIN LINEN APH (SET/KITS/TRAYS/PACK) ×2 IMPLANT
SPONGE DRAIN TRACH 4X4 STRL 2S (GAUZE/BANDAGES/DRESSINGS) ×2 IMPLANT
SPONGE GAUZE 4X4 12PLY (GAUZE/BANDAGES/DRESSINGS) ×2 IMPLANT
SPONGE LAP 18X18 X RAY DECT (DISPOSABLE) ×2 IMPLANT
STAPLER GUN LINEAR PROX 60 (STAPLE) IMPLANT
STAPLER PROXIMATE 55 BLUE (STAPLE) IMPLANT
STAPLER PROXIMATE 75MM BLUE (STAPLE) IMPLANT
STAPLER VISISTAT 35W (STAPLE) ×2 IMPLANT
SUCTION POOLE TIP (SUCTIONS) ×4 IMPLANT
SUT CHROMIC 0 SH (SUTURE) IMPLANT
SUT CHROMIC 2 0 SH (SUTURE) IMPLANT
SUT ETHILON 3 0 FSL (SUTURE) ×4 IMPLANT
SUT PDS AB 0 CTX 60 (SUTURE) ×8 IMPLANT
SUT PROLENE 2 0 SH 30 (SUTURE) ×2 IMPLANT
SUT SILK 2 0 (SUTURE) ×1
SUT SILK 2-0 18XBRD TIE 12 (SUTURE) ×1 IMPLANT
SUT SILK 3 0 SH CR/8 (SUTURE) ×4 IMPLANT
TAPE CLOTH SURG 4X10 WHT LF (GAUZE/BANDAGES/DRESSINGS) ×2 IMPLANT
TRAY FOLEY CATH 14FR (SET/KITS/TRAYS/PACK) ×2 IMPLANT
YANKAUER SUCT BULB TIP 10FT TU (MISCELLANEOUS) ×4 IMPLANT

## 2012-02-27 NOTE — Progress Notes (Addendum)
Discussed with Dr. Laural Golden.  Events noted.  Abdomen much more distended. Tachycardic and tachypneic.  Dr. Geroge Baseman here to take patient for ex-lap. Discussed with family.

## 2012-02-27 NOTE — Progress Notes (Signed)
Received a call from ENDO asking if patient received his STAT PO vancomycin and Flagyl IV.  Informed Dr Laural Golden that no order was given to me STAT. Dr Laural Golden stated that he put a note in to receive ASAP. I informed the physician that ASAP and STAT were not the same. Informed him that I was just receiving the meds to the floor as we speak. States that is unacceptable. I spoke with pharmacy to inform them. States order was not put in as STAT.

## 2012-02-27 NOTE — Anesthesia Preprocedure Evaluation (Signed)
Anesthesia Evaluation  Patient identified by MRN, date of birth, ID band Patient awake    Reviewed: Allergy & Precautions, H&P , NPO status , Patient's Chart, lab work & pertinent test results, reviewed documented beta blocker date and time   Airway Mallampati: I TM Distance: >3 FB Neck ROM: Full    Dental  (+) Teeth Intact   Pulmonary  breath sounds clear to auscultation        Cardiovascular Rate:Tachycardia  Tachycardia   Neuro/Psych    GI/Hepatic PUD, Colonic obstrucion   Endo/Other    Renal/GU      Musculoskeletal   Abdominal (+)  Abdomen: rigid and tender.    Peds  Hematology   Anesthesia Other Findings   Reproductive/Obstetrics                           Anesthesia Physical Anesthesia Plan  ASA: III and emergent  Anesthesia Plan: General   Post-op Pain Management:    Induction: Intravenous, Rapid sequence and Cricoid pressure planned  Airway Management Planned: Oral ETT  Additional Equipment:   Intra-op Plan:   Post-operative Plan:   Informed Consent: I have reviewed the patients History and Physical, chart, labs and discussed the procedure including the risks, benefits and alternatives for the proposed anesthesia with the patient or authorized representative who has indicated his/her understanding and acceptance.   Dental advisory given  Plan Discussed with: Anesthesiologist and Surgeon  Anesthesia Plan Comments:         Anesthesia Quick Evaluation

## 2012-02-27 NOTE — OR Nursing (Signed)
Dr. Laural Golden notified of patient's heartrate 168-170 bpm. BP-145/99, respirations 38-42. Dr. Laural Golden ordered for patient to go to ICU. Dr. Laural Golden to call Dr. Conley Canal and notifiy of the above.

## 2012-02-27 NOTE — Consult Note (Signed)
Reason for Consult: Colonic stricture Referring Physician: [facility] hospitalist, Dr. Elson Clan Z Connor [name] is an 52 y.o. male.  HPI: Patient presented to [facility] symptoms. During his hospitalization he continued to have episodes of diarrhea which progressed with increasing abdominal distention. Patient has history of ulcerative colitis and previous colostomy which her first last year. He was having loose diarrhea up until yesterday. No melena or hematochezia. He is not passing any flatus. He was taken to the endoscopy suite for evaluation by Dr. 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 He was unable to pass a scope and patient remained severely distended following the procedure. Abdominal pain and discomfort has increased.  Past Medical History  Diagnosis Date  . Chronic diarrhea   . Rectal bleed   . Hemorrhoids   . Ulcerative colitis     Distal UC over 8 yrs ago diagnosed  . Diverticulitis of large intestine with perforation 10/2011    done at [place]    Past Surgical History  Procedure Date  . Colonoscopy 9/03  . Sigmoidoscopy      06/13/2002  . Temporary ostomy November 06, 2010     for a colon perforation that was done in [facility] (Dr Payton Doughty).    . Colonoscopy 02/16/2011    Procedure: COLONOSCOPY;  Surgeon: Rogene Houston, MD;  Location: [facility] SU[facility];  Service: Endoscopy;  Laterality: N/A;  100    Family History  Problem Relation Age of Onset  . Cancer Sister   . Healthy Daughter   . Hypertension Mother     Social History:  reports that he quit smoking about 27 years ago. His smoking use included Cigarettes. He has never used smokeless tobacco. He reports that he does not drink alcohol or use illicit drugs.  Allergies:  Allergies  Allergen Reactions  . Penicillins Other (See Comments)    Heart rate changes    Medications:  I have reviewed the patient's current  medications. Prior to Admission:  Prescriptions prior to admission  Medication Sig Dispense Refill  . bifidobacterium infantis (ALIGN) capsule Take 1 capsule by mouth daily.  14 capsule  0  . FORTESTA 10 MG/ACT (2%) GEL at bedtime. Patient states that he applies it to his leg      . ibuprofen (ADVIL,MOTRIN) 200 MG tablet Take 400 mg by mouth 2 (two) times daily as needed. For pain      . Mesalamine (ASACOL HD) 800 MG TBEC Take 2 tablets (1,600 mg total) by mouth 2 (two) times daily.  180 tablet  3  . Pseudoeph-Doxylamine-DM-APAP (NYQUIL D COLD/FLU) 60-12.06-22-998 MG/30ML LIQD Take 30 mLs by mouth at bedtime. Cold/flu symptoms      . Pseudoephedrine-APAP-DM (DAYQUIL MULTI-SYMPTOM COLD/FLU PO) Take 1 capsule by mouth every 4 (four) hours as needed. Cold symptoms       Scheduled:   . ciprofloxacin  400 mg Intravenous Once  . enoxaparin (LOVENOX) injection  40 mg Subcutaneous Once  . meperidine      . [facility] HOLD] metronidazole  500 mg Intravenous Q6H  . metronidazole  500 mg Intravenous Once  . midazolam      . mupirocin ointment   Nasal BID  . pantoprazole (PROTONIX) IV  40 mg Intravenous Q24H  . sodium chloride  1,000 mL Intravenous Once  . [MAR HOLD] sodium chloride  3 mL Intravenous Q12H  . [facility] HOLD] vancomycin  250 mg Oral Q6H  Continuous:   . sodium chloride    . 0.9 % NaCl with KCl 20 mEq / L 175 mL/hr at 02/27/12 1151  . lactated ringers 1,000 mL (02/27/12 1526)   PRN:[MAR HOLD] acetaminophen, [MAR HOLD] acetaminophen, [MAR HOLD]  HYDROmorphone (DILAUDID) injection, [MAR HOLD] ondansetron (ZOFRAN) IV, [MAR HOLD] ondansetron  Results for orders placed during the hospital encounter of 02/26/12 (from the past 48 hour(s))  CBC WITH DIFFERENTIAL     Status: Abnormal   Collection Time   02/26/12  2:09 PM      Component Value Range Comment   WBC 13.8 (*) 4.0 - 10.5 K/uL    RBC 5.20  4.22 - 5.81 MIL/uL    Hemoglobin 17.2 (*) 13.0 - 17.0 g/dL    HCT 47.7  39.0 - 52.0 %    MCV  91.7  78.0 - 100.0 fL    MCH 33.1  26.0 - 34.0 pg    MCHC 36.1 (*) 30.0 - 36.0 g/dL    RDW 13.4  11.5 - 15.5 %    Platelets 134 (*) 150 - 400 K/uL    Neutrophils Relative 81 (*) 43 - 77 %    Neutro Abs 11.2 (*) 1.7 - 7.7 K/uL    Lymphocytes Relative 4 (*) 12 - 46 %    Lymphs Abs 0.6 (*) 0.7 - 4.0 K/uL    Monocytes Relative 15 (*) 3 - 12 %    Monocytes Absolute 2.0 (*) 0.1 - 1.0 K/uL    Eosinophils Relative 0  0 - 5 %    Eosinophils Absolute 0.0  0.0 - 0.7 K/uL    Basophils Relative 0  0 - 1 %    Basophils Absolute 0.0  0.0 - 0.1 K/uL   COMPREHENSIVE METABOLIC PANEL     Status: Abnormal   Collection Time   02/26/12  2:09 PM      Component Value Range Comment   Sodium 135  135 - 145 mEq/L    Potassium 3.7  3.5 - 5.1 mEq/L    Chloride 101  96 - 112 mEq/L    CO2 20  19 - 32 mEq/L    Glucose, Bld 114 (*) 70 - 99 mg/dL    BUN 27 (*) 6 - 23 mg/dL    Creatinine, Ser 1.15  0.50 - 1.35 mg/dL    Calcium 8.4  8.4 - 10.5 mg/dL    Total Protein 6.4  6.0 - 8.3 g/dL    Albumin 3.1 (*) 3.5 - 5.2 g/dL    AST 14  0 - 37 U/L    ALT 14  0 - 53 U/L    Alkaline Phosphatase 59  39 - 117 U/L    Total Bilirubin 1.3 (*) 0.3 - 1.2 mg/dL    GFR calc non Af Amer 71 (*) >90 mL/min    GFR calc Af Amer 82 (*) >90 mL/min   LIPASE, BLOOD     Status: Abnormal   Collection Time   02/26/12  2:09 PM      Component Value Range Comment   Lipase 9 (*) 11 - 59 U/L   TROPONIN I     Status: Normal   Collection Time   02/26/12  2:09 PM      Component Value Range Comment   Troponin I <0.30  <0.30 ng/mL   LACTIC ACID, PLASMA     Status: Normal   Collection Time   02/26/12  2:09 PM      Component Value Range  Comment   Lactic Acid, Venous 2.0  0.5 - 2.2 mmol/L   URINALYSIS, ROUTINE W REFLEX MICROSCOPIC     Status: Abnormal   Collection Time   02/26/12  4:27 PM      Component Value Range Comment   Color, Urine YELLOW  YELLOW    APPearance CLEAR  CLEAR    Specific Gravity, Urine 1.020  1.005 - 1.030    pH 5.5  5.0 - 8.0     Glucose, UA NEGATIVE  NEGATIVE mg/dL    Hgb urine dipstick LARGE (*) NEGATIVE    Bilirubin Urine [name] (*) NEGATIVE    Ketones, ur TRACE (*) NEGATIVE mg/dL    Protein, ur 100 (*) NEGATIVE mg/dL    Urobilinogen, UA 0.2  0.0 - 1.0 mg/dL    Nitrite NEGATIVE  NEGATIVE    Leukocytes, UA NEGATIVE  NEGATIVE   URINE MICROSCOPIC-ADD ON     Status: Abnormal   Collection Time   02/26/12  4:27 PM      Component Value Range Comment   WBC, UA 3-6  <3 WBC/hpf    RBC / HPF 11-20  <3 RBC/hpf    Bacteria, UA RARE  RARE    Casts GRANULAR CAST (*) NEGATIVE   INFLUENZA PANEL BY PCR     Status: Normal   Collection Time   02/26/12  5:13 PM      Component Value Range Comment   Influenza A By PCR NEGATIVE  NEGATIVE    Influenza B By PCR NEGATIVE  NEGATIVE    H1N1 flu by pcr NOT DETECTED  NOT DETECTED   CULTURE, BLOOD (ROUTINE X 2)     Status: Normal (Preliminary result)   Collection Time   02/27/12  4:58 AM      Component Value Range Comment   Specimen Description BLOOD RIGHT ARM      Special Requests BOTTLES DRAWN AEROBIC AND ANAEROBIC 6CC      Culture NO GROWTH <24 HRS      Report Status PENDING     BASIC METABOLIC PANEL     Status: Abnormal   Collection Time   02/27/12  5:00 AM      Component Value Range Comment   Sodium 134 (*) 135 - 145 mEq/L    Potassium 4.4  3.5 - 5.1 mEq/L    Chloride 104  96 - 112 mEq/L    CO2 18 (*) 19 - 32 mEq/L    Glucose, Bld 127 (*) 70 - 99 mg/dL    BUN 22  6 - 23 mg/dL    Creatinine, Ser 1.00  0.50 - 1.35 mg/dL    Calcium 7.6 (*) 8.4 - 10.5 mg/dL    GFR calc non Af Amer 84 (*) >90 mL/min    GFR calc Af Amer >90  >90 mL/min   CBC WITH DIFFERENTIAL     Status: Abnormal   Collection Time   02/27/12  5:00 AM      Component Value Range Comment   WBC 10.2  4.0 - 10.5 K/uL    RBC 5.16  4.22 - 5.81 MIL/uL    Hemoglobin 17.1 (*) 13.0 - 17.0 g/dL    HCT 47.9  39.0 - 52.0 %    MCV 92.8  78.0 - 100.0 fL    MCH 33.1  26.0 - 34.0 pg    MCHC 35.7  30.0 - 36.0 g/dL    RDW 13.7   11.5 - 15.5 %    Platelets 165  150 -  400 K/uL    Neutrophils Relative 80 (*) 43 - 77 %    Neutro Abs 8.2 (*) 1.7 - 7.7 K/uL    Lymphocytes Relative 6 (*) 12 - 46 %    Lymphs Abs 0.6 (*) 0.7 - 4.0 K/uL    Monocytes Relative 14 (*) 3 - 12 %    Monocytes Absolute 1.5 (*) 0.1 - 1.0 K/uL    Eosinophils Relative 0  0 - 5 %    Eosinophils Absolute 0.0  0.0 - 0.7 K/uL    Basophils Relative 0  0 - 1 %    Basophils Absolute 0.0  0.0 - 0.1 K/uL    WBC Morphology INCREASED BANDS (>20% BANDS)     LACTIC ACID, PLASMA     Status: Abnormal   Collection Time   02/27/12  5:00 AM      Component Value Range Comment   Lactic Acid, Venous 3.3 (*) 0.5 - 2.2 mmol/L   HEPATIC FUNCTION PANEL     Status: Abnormal   Collection Time   02/27/12  5:00 AM      Component Value Range Comment   Total Protein 5.9 (*) 6.0 - 8.3 g/dL    Albumin 2.6 (*) 3.5 - 5.2 g/dL    AST 14  0 - 37 U/L    ALT 13  0 - 53 U/L    Alkaline Phosphatase 61  39 - 117 U/L    Total Bilirubin 0.9  0.3 - 1.2 mg/dL    Bilirubin, Direct 0.3  0.0 - 0.3 mg/dL    Indirect Bilirubin 0.6  0.3 - 0.9 mg/dL   CULTURE, BLOOD (ROUTINE X 2)     Status: Normal (Preliminary result)   Collection Time   02/27/12  5:15 AM      Component Value Range Comment   Specimen Description BLOOD RIGHT HAND      Special Requests BOTTLES DRAWN AEROBIC AND ANAEROBIC 6CC      Culture NO GROWTH <24 HRS      Report Status PENDING     SEDIMENTATION RATE     Status: Normal   Collection Time   02/27/12  9:22 AM      Component Value Range Comment   Sed Rate 3  0 - 16 mm/hr   C-REACTIVE PROTEIN     Status: Abnormal   Collection Time   02/27/12  9:22 AM      Component Value Range Comment   CRP 24.7 (*) <0.60 mg/dL   LACTIC ACID, PLASMA     Status: Abnormal   Collection Time   02/27/12  9:24 AM      Component Value Range Comment   Lactic Acid, Venous 3.8 (*) 0.5 - 2.2 mmol/L     Ct Abdomen Pelvis W Contrast  02/26/2012  *RADIOLOGY REPORT*  Clinical Data: Diarrhea, body  aches, prior colon surgery with colostomy for perforation  CT ABDOMEN AND PELVIS WITH CONTRAST  Technique:  Multidetector CT imaging of the abdomen and pelvis was performed following the standard protocol during bolus administration of intravenous contrast.  Contrast: 100 ml Omnipaque-300 IV  Comparison: 11/06/2010  Findings: Lung bases are essentially clear.  Liver, spleen, pancreas, and adrenal glands are within normal limits.  Gallbladder is unremarkable.  No intrahepatic or extrahepatic ductal dilatation.  Three left renal cysts measuring up to 1.7 cm.  Right kidney is within normal limits.  No hydronephrosis.  No evidence of bowel obstruction.  Normal appendix.  Prior distal colonic resection with reanastomosis  in the pelvis (series 2/image 74).  Moderate stool in the colon.  Atherosclerotic calcifications of the abdominal aorta and branch vessels.  No abdominopelvic ascites.  No suspicious abdominopelvic lymphadenopathy.  [name] fat-containing left abdominal hernia at the site of prior colostomy (series 2/image 7).  Prostate is unremarkable.  Bladder is within normal limits.  Mild degenerative changes of the visualized thoracolumbar spine.  IMPRESSION: No evidence of bowel obstruction.  Normal appendix.  Prior distal colonic resection with reanastomosis.  Moderate stool throughout the colon, suggesting constipation.  Otherwise, no CT findings to account for the patient's abdominal pain.   Original Report Authenticated By: Julian Hy, M.D.    Dg Chest Port 1 View  02/27/2012  *RADIOLOGY REPORT*  Clinical Data: Fever with body aches.  PORTABLE CHEST - 1 VIEW  Comparison: None.  Findings: 0517 hours.  Left lung is clear.  There is a probable subsegmental atelectasis at the right base. Cardiopericardial silhouette is at upper limits of normal for size. Telemetry leads overlie the chest.  IMPRESSION: Low volume film with probable right basilar atelectasis.  There is some fullness in the infrahilar regions  bilaterally.  Dedicated PA and lateral follow-up film recommended after resolution of acute symptoms.   Original Report Authenticated By: Misty Stanley, M.D.    Dg Abd 2 Views  02/27/2012  *RADIOLOGY REPORT*  Clinical Data: Abdominal pain.  Evaluate for obstruction.  History of ulcerative colitis.  ABDOMEN - 2 VIEW  Comparison: CT scan from 02/26/2012  Findings: Upright film shows no evidence for intraperitoneal free air.  Upright film shows multiple colonic large air-fluid levels. Supine film shows diffuse gaseous colonic distention in the transverse and left colon.  Contrast material in the urinary bladder is compatible with the CT scan yesterday.  No gaseous small bowel dilatation.  IMPRESSION: Gaseous distention of colon noted down to the level of the rectum. Given the history of ulcerative colitis and rectosigmoid anastomoses, anastomotic stricture would be a consideration.  I personally discussed the results of this study with Dr. Conley Canal at to 0910 hours on 02/27/2012.   Original Report Authenticated By: Misty Stanley, M.D.     Review of Systems  Constitutional: Positive for fever, chills and diaphoresis. Negative for weight loss and malaise/fatigue.  HENT: Negative.   Eyes: Negative.   Respiratory: Positive for cough.   Cardiovascular: Positive for chest pain.  Gastrointestinal: Positive for nausea, vomiting, abdominal pain (since this morning) and diarrhea. Negative for constipation, blood in stool and melena.  Genitourinary: Negative.   Musculoskeletal: Negative.   Skin: Negative.   Neurological: Negative.  Negative for weakness.  Endo/Heme/Allergies: Negative.   Psychiatric/Behavioral: Negative.    Blood pressure 141/95, pulse 162, temperature 98.2 F (36.8 C), temperature source Oral, resp. rate 34, height 5' 9"  (1.753 m), weight 95.255 kg (210 lb), SpO2 98.00%. Physical Exam  Constitutional: He is oriented to person, place, and time. He appears well-developed and well-nourished. No  distress.       Uncomfortable appearing  HENT:  Head: Normocephalic and atraumatic.  Eyes: Conjunctivae normal and EOM are normal. Pupils are equal, round, and reactive to light. No scleral icterus.  Neck: Normal range of motion. Neck supple. No tracheal deviation present. No thyromegaly present.  Cardiovascular: Normal rate, regular rhythm and normal heart sounds.   Respiratory: Effort normal and breath sounds normal. No respiratory distress. He has no wheezes.  GI: Soft. He exhibits distension (tympanic). He exhibits no mass. There is tenderness (moderate diffuse). There is no rebound and no  guarding.  Lymphadenopathy:    He has no cervical adenopathy.  Neurological: He is alert and oriented to person, place, and time.  Skin: Skin is warm and dry.    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 Tachycardia. Abdominal pain. At this time I had a discussion with the patient regarding the findings. Pain concern that patient has diffuse distention of his colon due to the stricture and both the clinical course as well as the recent colonoscopy. Surgical options were discussed I do feel the patient does need to proceed to the operating room for urgent decompression. Plan cecostomy was discussed. Possible bowel resection was also discussed should nonviable colon Be identified.   Risks benefits alternatives were also discussed with family. We'll see to the operating room for the procedure  Lorelai Huyser C 02/27/2012, 3:36 PM

## 2012-02-27 NOTE — Progress Notes (Signed)
Resting quietly. Opens eyes to name. Denies pain. resp adequate/nonlabored. O2 continued via non-rebreather mask. Mouth breathing. Does not tolerate nasal cannula. Occasional productive cough of clear mucus. Refuses to be suctioned or spit. Returns to sleep. Waiting to give report to ICU.

## 2012-02-27 NOTE — OR Nursing (Signed)
To PACU  For observation until Dr. Bernette Mayers arrives.  Dr. Laural Golden notified.

## 2012-02-27 NOTE — Progress Notes (Signed)
Transported to Endo per wheelchair.

## 2012-02-27 NOTE — Progress Notes (Signed)
From OR. Oral airway in place. No response to touch or verbal stimuli.

## 2012-02-27 NOTE — Op Note (Signed)
FLEXIBLE SIGMOIDOSCOPY  PROCEDURE REPORT  PATIENT:  Connor [name]  MR#:  [id number] Birthdate:  [birth year], 52 y.o., male Endoscopist:  Dr. Rogene Houston, MD Procedure Date: 02/27/2012   Procedure:  Flexible sigmoidoscopy.  Indications:  Patient is 52 year old Caucasian male who presents with acute onset of diarrhea followed by progressive abdominal distention and pain. He suspected to have C. difficile colitis. He is also suspected to have a colonic anastomotic stricture resulting in obstructive symptoms.  Informed Consent:  The procedure and risks were reviewed with the patient and informed consent was obtained.  Medications:  Demerol 25 mg IV Versed 2 mg IV  Description of procedure: Patient was placed in left lateral position rectal examination performed. No abnormality noted an external addition exam. Pentex video colonoscope was placed the rectum. Mucosa was diffusely covered with pseudomembranes. Did not find anastomosis with a regular scope. Scope was exchanged pediatric colonoscope and finally to slim scope. High-grade stricture noted at anastomosis. I was not able to pass the scope across it. The stricture was dilated 10 mm with a balloon but still unable to pass the scope above it. Therefore: Could not be decompressed. Endoscope was withdrawn.  Findings:   Multiple pseudomembranes consistent with C. difficile colitis. High-grade colorectal anastomotic stricture dilated to 10 mm with a balloon but unable to decompress the colon.  Therapeutic/Diagnostic Maneuvers Performed:  See above  Complications:  None    Impression:  C. difficile colitis. High-grade colorectal anastomotic stricture. It was dilated with  10 mm balloon but unable to advance scope further or decompress colon.  Recommendations:  Standard instructions given. Patient given vancomycin 500 mg IV. Consultation for cecostomy. I will contact patient with biopsy results and further  recommendations.  Kelden Lavallee U  02/27/2012 2:23 PM  CC: Dr. No primary provider on file. & Dr. No ref. provider found

## 2012-02-27 NOTE — Care Management Note (Signed)
Page 2 of 2   04/06/2012     11:09:00 AM   CARE MANAGEMENT NOTE 04/06/2012  Patient:  Connor [name], Connor [name]   Account Number:  [id number]  Date Initiated:  02/27/2012  Documentation initiated by:  Claretha Cooper  Subjective/Objective Assessment:   Pt from home where he lives with wife. Pt asleep but CM spoke to spouse and daughter at bedside. Per wife, Dr. Melford Aase in [place] is pt's PCP.     Action/Plan:   PT/OT evals-  would benefit from CIR consult when medically ready- has already been screen and CIR feels like pt has potential for CIR   Anticipated DC Date:  03/28/2012   Anticipated DC Plan:  IP REHAB FACILITY  In-house referral  Clinical Social Worker      DC Planning Services  CM consult      Choice offered to / List presented to:             Status of service:  In process, will continue to follow Medicare Important Message given?   (If response is "NO", the following Medicare IM given date fields will be blank) Date Medicare IM given:   Date Additional Medicare IM given:    Discharge Disposition:    Per UR Regulation:  Reviewed for med. necessity/level of care/duration of stay  If discussed at [place] of Stay Meetings, dates discussed:   03/13/2012  03/15/2012  03/22/2012  03/27/2012  03/29/2012  04/03/2012    Comments:  ContactNycere, Presley Spouse [phone]                Fosnaugh,Shandale Daughter     [phone]  3/14 0737 debbie dowell rn,bsn pt/ot rec cir, need cir consult. will leave sticky note. today phy ther rec ltac. [facility] does not have [facility] contract. cm w cir does not think [facility]bs will cover cir. asked mike w [facility] to see if he thinks pt would be elidg. bcbs usually only covers ltac if on vent. have also made sw ref.  04-02-12 11:35am Luz Lex, RNBSN.  [phone] Wound closure on Saturday. Ordered for Intermediate unit. PT starting to work with him again today.  03-27-12 10:35am Merlin [phone] Post op on vent and  pressors - extubated this am.  VAC continues - unable to close wound.  03-26-12 9:30am Luz Lex, RNBSN [phone] tx back to ICU - hypotensive requiring albumin and placed back on IV antibiotics.  Complete dehiscence of abd - plan for return to surgery.  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 Oropharyngeal weakness noted- will try po feeding over weekend and re-eval need for tube feedings on 3/3- CIR continues to follow for potential CIR vs SNF- CSW also following for potential SNF need.  03-21-12 12noonn - Luz Lex, RNBSN [phone] Extubated, off CRRT - now with small wound dehiscence, possible uretor leak.  Tx to intermediate.  Wound VAC form completed and on chart if needed.  Patient will need rehab - ?? CIR vs SNF.  PT/OT consult - SW on board and following also.  03-12-12 10am [place] Continues on vent - talking trach - continues on pressors, not waking up, starting CRRT today.  03-09-12 2:20pm Luz Lex, RNBSN [phone] Remains on vent - however weaning and hopeful for extubation - still on pressors and lasix drip.  03-02-12 Sondra Barges, RNBSN [phone] patient now  septic, on vent, pressors.  CM will continue to follow for progression.  admitted to [facility] for Cdiff on 2/2. Underwent decompressive cecostomy 2/3, initially was better, then worsened on 2/4 with increased abdominal distension and tenderness, decreased renal function.  He was taken back to the OR on 2/4 for total abdominal colectomy and end ileostomy.  Post op he developed ARDS and sepsis shock and was being treated for this.  The morning of 2/6 he had new onset of A-fib and transferred to [place]    03/01/12 Amy Robson RN BNS CM Pt transfered to [facility] via [facility].  02/27/12 Amy Robosn RN BSN CM

## 2012-02-27 NOTE — Progress Notes (Signed)
Opens eyes to name. Oral airway d/c'd. resp adequate/nonlabored. Suctioned x1 via oral cavity for moderate amt clear mucus. Mouth breathing. O2 continued via non-rebreather. Returns to sleep.

## 2012-02-27 NOTE — Consult Note (Signed)
Referring Provider: No ref. provider found Primary Care Physician:  No primary provider on file. Primary Gastroenterologist:  Dr. Laural Golden  Reason for Consultation:  Abdominal pain and distention; possible anastomotic stricture.  HPI:  Patient is 52 year old Caucasian male who has 10 year history of ulcerative colitis. He was last seen in the office on 01/16/2012 and appeared to be in remission. He had full recovery from 2 surgeries that he had for colonic perforation etiology of which could never be determined. He did undergo a colonoscopy prior to takedown of his colostomy and he was in remission. Patient was in usual state of health until the morning of 02/25/2012 when he woke up sick. He felt fine the night before and he went to bed. He noted sore throat back pain as well as pain in his hands. Later that morning he started to pass loose stools. He had multiple bowel movements. He also became febrile. His temp was over 102 F. yesterday he developed abdominal distention and tightness. He also was unable to eat or drink. He therefore came to emergency room. He was evaluated and hospitalized. He was noted to have markedly dilated colon. This morning he had acute abdominal series and Dr. Tery Sanfilippo felt he may have distal colonic stricture at the site of anastomosis. Therefore I was consulted. Patient does not feel well. He complains of very tight and distended abdomen. He's been passing scant amount of liquid stool today. He denies nausea or vomiting but has poor appetite. Patient states that he had a root canal done 2 weeks ago and developed infection and was treated with clindamycin for one week. Prior to onset of these symptoms he has had good appetite and has been having formed stools. No history of melena or rectal bleeding.  Past Medical History  Diagnosis Date  . Chronic diarrhea   . Rectal bleed   . Hemorrhoids   . Ulcerative colitis     Distal UC over 8 yrs ago diagnosed  . Diverticulitis of  large intestine with perforation 10/2011    done at [place]    Past Surgical History  Procedure Date  . Colonoscopy 9/03  . Sigmoidoscopy      06/13/2002  . Temporary ostomy November 06, 2010     for a colon perforation that was done in [facility] (Dr Payton Doughty).    . Colonoscopy 02/16/2011    Procedure: COLONOSCOPY;  Surgeon: Rogene Houston, MD;  Location: [facility];  Service: Endoscopy;  Laterality: N/A;  100    Prior to Admission medications   Medication Sig Start Date End Date Taking? Authorizing Provider  bifidobacterium infantis (ALIGN) capsule Take 1 capsule by mouth daily. 07/18/11 07/17/12 Yes Rogene Houston, MD  FORTESTA 10 MG/ACT (2%) GEL at bedtime. Patient states that he applies it to his leg 12/05/11  Yes Historical Provider, MD  ibuprofen (ADVIL,MOTRIN) 200 MG tablet Take 400 mg by mouth 2 (two) times daily as needed. For pain   Yes Historical Provider, MD  Mesalamine (ASACOL HD) 800 MG TBEC Take 2 tablets (1,600 mg total) by mouth 2 (two) times daily. 01/16/12  Yes Rogene Houston, MD  Pseudoeph-Doxylamine-DM-APAP (NYQUIL D COLD/FLU) 60-12.06-22-998 MG/30ML LIQD Take 30 mLs by mouth at bedtime. Cold/flu symptoms   Yes Historical Provider, MD  Pseudoephedrine-APAP-DM (DAYQUIL MULTI-SYMPTOM COLD/FLU PO) Take 1 capsule by mouth every 4 (four) hours as needed. Cold symptoms   Yes Historical Provider, MD    Current Facility-Administered Medications  Medication Dose Route Frequency Provider  Last Rate Last Dose  . 0.9 % NaCl with KCl 20 mEq/ L  infusion   Intravenous Continuous Delfina Redwood, MD 125 mL/hr at 02/27/12 0228 125 mL/hr at 02/27/12 0228  . acetaminophen (TYLENOL) tablet 650 mg  650 mg Oral Q6H PRN Delfina Redwood, MD   650 mg at 02/27/12 1941   Or  . acetaminophen (TYLENOL) suppository 650 mg  650 mg Rectal Q6H PRN Delfina Redwood, MD      . alum & mag hydroxide-simeth (MAALOX/MYLANTA) 200-200-20 MG/5ML suspension 30 mL  30 mL Oral Q6H PRN Delfina Redwood, MD      . HYDROcodone-acetaminophen (NORCO/VICODIN) 5-325 MG per tablet 1-2 tablet  1-2 tablet Oral Q4H PRN Delfina Redwood, MD      . HYDROmorphone (DILAUDID) injection 0.5 mg  0.5 mg Intravenous Q3H PRN Delfina Redwood, MD   0.5 mg at 02/27/12 0826  . Mesalamine (ASACOL) DR capsule 1,600 mg  1,600 mg Oral BID Delfina Redwood, MD      . metroNIDAZOLE (FLAGYL) IVPB 500 mg  500 mg Intravenous Q6H Rogene Houston, MD      . ondansetron (ZOFRAN) tablet 4 mg  4 mg Oral Q6H PRN Delfina Redwood, MD       Or  . ondansetron [facility]) injection 4 mg  4 mg Intravenous Q6H PRN Delfina Redwood, MD      . sodium chloride 0.9 % bolus 500 mL  500 mL Intravenous Once Bonnielee Haff, MD      . sodium chloride 0.9 % injection 3 mL  3 mL Intravenous Q12H Delfina Redwood, MD   3 mL at 02/26/12 2209  . traZODone (DESYREL) tablet 50 mg  50 mg Oral QHS PRN Delfina Redwood, MD      . vancomycin (VANCOCIN) 50 mg/mL oral solution 250 mg  250 mg Oral Q6H Rogene Houston, MD        Allergies as of 02/26/2012 - Review Complete 02/26/2012  Allergen Reaction Noted  . Penicillins Other (See Comments) 09/20/2010    Family History  Problem Relation Age of Onset  . Cancer Sister   . Healthy Daughter   . Hypertension Mother     History   Social History  . Marital Status: Married    Spouse Name: N/A    Number of Children: N/A  . Years of Education: N/A   Occupational History  . Not on file.   Social History Main Topics  . Smoking status: Former Smoker    Types: Cigarettes    Quit date: 07/17/1984  . Smokeless tobacco: Never Used     Comment: Quit 23 yrs ago  . Alcohol Use: No  . Drug Use: No  . Sexually Active: Yes   Other Topics Concern  . Not on file   Social History Narrative  . No narrative on file    Review of Systems: See HPI, otherwise normal ROS  Physical Exam: BP 121/76  Pulse 130  Temp 98 F (36.7 C) (Oral)  Resp 22  Ht 5' 9"  (1.753 m)  Wt 210 lb  (95.255 kg)  BMI 31.01 kg/m2  SpO2 95% Patient is alert and appears to be acutely ill. Conjunctivae was pink. Sclerae nonicteric.  Oropharyngeal mucosa is dry otherwise unremarkable.  No neck masses or thyromegaly noted. Cardiac exam with regular rhythm normal S1 and S2. No murmur or gallop noted. Lungs are clear to auscultation. Abdomen is markedly distended. Bowel sounds are hypoactive.  Percussion note is very tympanitic. It is mild diffuse tenderness without guarding. No organomegaly or masses. Rectal examination reveals scant amount of stool in the vault. 40s catheter was placed but there was no passage of flatus or feces.  Intake/Output from previous day: 02/02 0701 - 02/03 0700 In: 808.3 [I.V.:808.3] Out: -  Intake/Output this shift:    Lab Results:  Basename 02/27/12 0500 02/26/12 1409  WBC 10.2 13.8*  HGB 17.1* 17.2*  HCT 47.9 47.7  PLT 165 134*   BMET  Basename 02/27/12 0500 02/26/12 1409  NA 134* 135  K 4.4 3.7  CL 104 101  CO2 18* 20  GLUCOSE 127* 114*  BUN 22 27*  CREATININE 1.00 1.15  CALCIUM 7.6* 8.4   LFT  Basename 02/27/12 0500  PROT 5.9*  ALBUMIN 2.6*  AST 14  ALT 13  ALKPHOS 61  BILITOT 0.9  BILIDIR 0.3  IBILI 0.6   PT/INR No results found for this basename: LABPROT:2,INR:2 in the last 72 hours Hepatitis Panel No results found for this basename: HEPBSAG,HCVAB,HEPAIGM,HEPBIGM in the last 72 hours  Studies/Results: Ct Abdomen Pelvis W Contrast  02/26/2012  *RADIOLOGY REPORT*  Clinical Data: Diarrhea, body aches, prior colon surgery with colostomy for perforation  CT ABDOMEN AND PELVIS WITH CONTRAST  Technique:  Multidetector CT imaging of the abdomen and pelvis was performed following the standard protocol during bolus administration of intravenous contrast.  Contrast: 100 ml Omnipaque-300 IV  Comparison: 11/06/2010  Findings: Lung bases are essentially clear.  Liver, spleen, pancreas, and adrenal glands are within normal limits.  Gallbladder  is unremarkable.  No intrahepatic or extrahepatic ductal dilatation.  Three left renal cysts measuring up to 1.7 cm.  Right kidney is within normal limits.  No hydronephrosis.  No evidence of bowel obstruction.  Normal appendix.  Prior distal colonic resection with reanastomosis in the pelvis (series 2/image 74).  Moderate stool in the colon.  Atherosclerotic calcifications of the abdominal aorta and branch vessels.  No abdominopelvic ascites.  No suspicious abdominopelvic lymphadenopathy.  [name] fat-containing left abdominal hernia at the site of prior colostomy (series 2/image 7).  Prostate is unremarkable.  Bladder is within normal limits.  Mild degenerative changes of the visualized thoracolumbar spine.  IMPRESSION: No evidence of bowel obstruction.  Normal appendix.  Prior distal colonic resection with reanastomosis.  Moderate stool throughout the colon, suggesting constipation.  Otherwise, no CT findings to account for the patient's abdominal pain.   Original Report Authenticated By: Julian Hy, M.D.    Dg Chest Port 1 View  02/27/2012  *RADIOLOGY REPORT*  Clinical Data: Fever with body aches.  PORTABLE CHEST - 1 VIEW  Comparison: None.  Findings: 0517 hours.  Left lung is clear.  There is a probable subsegmental atelectasis at the right base. Cardiopericardial silhouette is at upper limits of normal for size. Telemetry leads overlie the chest.  IMPRESSION: Low volume film with probable right basilar atelectasis.  There is some fullness in the infrahilar regions bilaterally.  Dedicated PA and lateral follow-up film recommended after resolution of acute symptoms.   Original Report Authenticated By: Misty Stanley, M.D.    Dg Abd 2 Views  02/27/2012  *RADIOLOGY REPORT*  Clinical Data: Abdominal pain.  Evaluate for obstruction.  History of ulcerative colitis.  ABDOMEN - 2 VIEW  Comparison: CT scan from 02/26/2012  Findings: Upright film shows no evidence for intraperitoneal free air.  Upright film shows  multiple colonic large air-fluid levels. Supine film shows diffuse gaseous colonic distention in the  transverse and left colon.  Contrast material in the urinary bladder is compatible with the CT scan yesterday.  No gaseous small bowel dilatation.  IMPRESSION: Gaseous distention of colon noted down to the level of the rectum. Given the history of ulcerative colitis and rectosigmoid anastomoses, anastomotic stricture would be a consideration.  I personally discussed the results of this study with Dr. Conley Canal at to 0910 hours on 02/27/2012.   Original Report Authenticated By: Misty Stanley, M.D.    I have reviewed patient's abdominal pelvic CT as well as abdominal series from this morning.  Assessment; Patient is 52 year old Caucasian male with history of ulcerative colitis who has been in remission on oral mesalamine who presents with acute symptom complex starting with sore throat myalgias fever followed by diarrhea and now he has developed abdominal distention. He was recently treated with clindamycin for infection following root canal. 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 I doubt that he has developed anastomotic stricture this can be easily ruled out at the time of colonic decompression.  Recommendations; Stool studies as planned Increase IV fluid rate to 175 mL per hour. Metronidazole 500 mg IV every 6 hours. Vancomycin 250 mg by mouth every 6 hours. Flexible sigmoidoscopy with colonic decompression later today.   LOS: 1 day   Eknoor Novack U  02/27/2012, 9:54 AM

## 2012-02-27 NOTE — Anesthesia Procedure Notes (Signed)
Procedure Name: Intubation Date/Time: 02/27/2012 4:45 PM Performed by: Antony Contras, AMY L Pre-anesthesia Checklist: Patient identified, Patient being monitored, Timeout performed, Emergency Drugs available and Suction available Patient Re-evaluated:Patient Re-evaluated prior to inductionOxygen Delivery Method: Circle System Utilized Preoxygenation: Pre-oxygenation with 100% oxygen Intubation Type: IV induction, Rapid sequence and Cricoid Pressure applied Laryngoscope Size: 3 and Miller Grade View: Grade I Tube type: Oral Tube size: 8.0 mm Number of attempts: 1 Airway Equipment and Method: stylet Placement Confirmation: ETT inserted through vocal cords under direct vision,  positive ETCO2 and breath sounds checked- equal and bilateral Secured at: 21 cm Tube secured with: Tape Dental Injury: Teeth and Oropharynx as per pre-operative assessment

## 2012-02-27 NOTE — Progress Notes (Signed)
Subjective: Continued to have fever and tachycardia last night. Now with lower abdominal cramping and "constipation". Feels bloated. Pain is quite severe. Also, low back pain continues. Asking for "an enema"and pain medication. No vomiting or nausea, but unable to see.  Objective: Vital signs in last 24 hours: Filed Vitals:   02/26/12 1844 02/26/12 2128 02/27/12 0215 02/27/12 0551  BP: 126/66 116/70 101/70 121/76  Pulse: 114 122 139 130  Temp: 98.2 F (36.8 C) 99.6 F (37.6 C) 101 F (38.3 C) 98 F (36.7 C)  TempSrc: Oral Oral Oral Oral  Resp: 18 20  22   Height: 5' 9"  (1.753 m)     Weight: 90.719 kg (200 lb)   95.255 kg (210 lb)  SpO2: 98% 96%  95%   Weight change:   Intake/Output Summary (Last 24 hours) at 02/27/12 0843 Last data filed at 02/27/12 0228  Gross per 24 hour  Intake 808.33 ml  Output      0 ml  Net 808.33 ml   Telemetry: Sinus tachycardia rate 135  General: Uncomfortable appearing. Alert and oriented. Lungs clear to auscultation bilaterally without wheeze rhonchi or rales Cardiovascular fast, regular no murmurs gallops rubs Abdomen: Normal bowel sounds. More distended today. Diffusely tender. Extremities no clubbing cyanosis or edema  Lab Results: Basic Metabolic Panel:  Lab 65/68/12 0500 02/26/12 1409  NA 134* 135  K 4.4 3.7  CL 104 101  CO2 18* 20  GLUCOSE 127* 114*  BUN 22 27*  CREATININE 1.00 1.15  CALCIUM 7.6* 8.4  MG -- --  PHOS -- --   Liver Function Tests:  Lab 02/27/12 0500 02/26/12 1409  AST 14 14  ALT 13 14  ALKPHOS 61 59  BILITOT 0.9 1.3*  PROT 5.9* 6.4  ALBUMIN 2.6* 3.1*    Lab 02/26/12 1409  LIPASE 9*  AMYLASE --   No results found for this basename: AMMONIA:2 in the last 168 hours CBC:  Lab 02/27/12 0500 02/26/12 1409  WBC 10.2 13.8*  NEUTROABS 8.2* 11.2*  HGB 17.1* 17.2*  HCT 47.9 47.7  MCV 92.8 91.7  PLT 165 134*   Cardiac Enzymes:  Lab 02/26/12 1409  CKTOTAL --  CKMB --  CKMBINDEX --  TROPONINI <0.30     Lab 02/26/12 1627  COLORURINE YELLOW  LABSPEC 1.020  PHURINE 5.5  GLUCOSEU NEGATIVE  HGBUR LARGE*  BILIRUBINUR [name]*  KETONESUR TRACE*  PROTEINUR 100*  UROBILINOGEN 0.2  NITRITE NEGATIVE  LEUKOCYTESUR NEGATIVE    Micro Results: No results found for this or any previous visit (from the past 240 hour(s)). Studies/Results: Ct Abdomen Pelvis W Contrast  02/26/2012  *RADIOLOGY REPORT*  Clinical Data: Diarrhea, body aches, prior colon surgery with colostomy for perforation  CT ABDOMEN AND PELVIS WITH CONTRAST  Technique:  Multidetector CT imaging of the abdomen and pelvis was performed following the standard protocol during bolus administration of intravenous contrast.  Contrast: 100 ml Omnipaque-300 IV  Comparison: 11/06/2010  Findings: Lung bases are essentially clear.  Liver, spleen, pancreas, and adrenal glands are within normal limits.  Gallbladder is unremarkable.  No intrahepatic or extrahepatic ductal dilatation.  Three left renal cysts measuring up to 1.7 cm.  Right kidney is within normal limits.  No hydronephrosis.  No evidence of bowel obstruction.  Normal appendix.  Prior distal colonic resection with reanastomosis in the pelvis (series 2/image 74).  Moderate stool in the colon.  Atherosclerotic calcifications of the abdominal aorta and branch vessels.  No abdominopelvic ascites.  No suspicious abdominopelvic lymphadenopathy.  [name] fat-containing left abdominal hernia at the site of prior colostomy (series 2/image 7).  Prostate is unremarkable.  Bladder is within normal limits.  Mild degenerative changes of the visualized thoracolumbar spine.  IMPRESSION: No evidence of bowel obstruction.  Normal appendix.  Prior distal colonic resection with reanastomosis.  Moderate stool throughout the colon, suggesting constipation.  Otherwise, no CT findings to account for the patient's abdominal pain.   Original Report Authenticated By: Julian Hy, M.D.    Dg Chest Port 1 View  02/27/2012   *RADIOLOGY REPORT*  Clinical Data: Fever with body aches.  PORTABLE CHEST - 1 VIEW  Comparison: None.  Findings: 0517 hours.  Left lung is clear.  There is a probable subsegmental atelectasis at the right base. Cardiopericardial silhouette is at upper limits of normal for size. Telemetry leads overlie the chest.  IMPRESSION: Low volume film with probable right basilar atelectasis.  There is some fullness in the infrahilar regions bilaterally.  Dedicated PA and lateral follow-up film recommended after resolution of acute symptoms.   Original Report Authenticated By: Misty Stanley, M.D.    Scheduled Meds:   . enoxaparin (LOVENOX) injection  40 mg Subcutaneous Q24H  . Mesalamine  1,600 mg Oral BID  . sodium chloride  500 mL Intravenous Once  . sodium chloride  3 mL Intravenous Q12H   Continuous Infusions:   . 0.9 % NaCl with KCl 20 mEq / L 125 mL/hr (02/27/12 0228)   PRN Meds:.acetaminophen, acetaminophen, alum & mag hydroxide-simeth, HYDROcodone-acetaminophen, HYDROmorphone (DILAUDID) injection, ibuprofen, ondansetron (ZOFRAN) IV, ondansetron, traZODone Assessment/Plan: Active Problems:  Fever  Dehydration  Abdominal pain, acute  Low back pain  UC (ulcerative colitis)  Abdomen more distended and now with constipation.  Remains febrile and tachycardic.  WBC normal and flu PCR negative.  Will get stat abdominal films to r/o obstruction.  May also need MRI lumbar spine at some point if no fever etiology found.  NPO   LOS: 1 day   Lataysha Vohra L 02/27/2012, 8:43 AM

## 2012-02-27 NOTE — Transfer of Care (Signed)
Immediate Anesthesia Transfer of Care Note  Patient: Connor [name]  Procedure(s) Performed: Procedure(s) (LRB) with comments: EXPLORATORY LAPAROTOMY (N/A) CECOSTOMY (N/A) - Cecostomy Tube Placement  Patient Location: PACU  Anesthesia Type:General  Level of Consciousness: sedated  Airway & Oxygen Therapy: Patient Spontanous Breathing and Patient connected to face mask oxygen  Post-op Assessment: Report given to PACU RN and Post -op Vital signs reviewed and stable  Post vital signs: Reviewed and stable  Complications: No apparent anesthesia complications

## 2012-02-27 NOTE — OR Nursing (Signed)
Attempted to place ng tube x 2 with 16 and 18 french but unable to auscultate air with stethoscope.  200 ml brownish/red contents aspirated with low intermitt. suction.  Dr. Laural Golden ordered to remove NG tube.  Awaiting surgical consult.

## 2012-02-27 NOTE — Progress Notes (Signed)
Pt being transferred to OR. Then will go to ICU/stepdown from there.

## 2012-02-27 NOTE — Anesthesia Postprocedure Evaluation (Signed)
  Anesthesia Post-op Note  Patient: Connor [name]  Procedure(s) Performed: Procedure(s) (LRB) with comments: EXPLORATORY LAPAROTOMY (N/A) CECOSTOMY (N/A) - Cecostomy Tube Placement  Patient Location: ICU  Anesthesia Type:General  Level of Consciousness: awake and patient cooperative  Airway and Oxygen Therapy: Patient Spontanous Breathing and Patient connected to face mask oxygen  Post-op Pain: 4 /10, moderate  Post-op Assessment: Post-op Vital signs reviewed, Patient's Cardiovascular Status Stable, Respiratory Function Stable, Patent Airway and Pain level controlled  Post-op Vital Signs: Reviewed and stable  Complications: No apparent anesthesia complications

## 2012-02-27 NOTE — Progress Notes (Signed)
Dr. Geroge Baseman in along with Dr. Laurence Aly in to evaluate pt.

## 2012-02-27 NOTE — Preoperative (Signed)
Beta Blockers   Reason not to administer Beta Blockers:Not Applicable 

## 2012-02-27 NOTE — Progress Notes (Signed)
Nutrition Brief Note  Patient identified on the Malnutrition Screening Tool (MST) Report  Body mass index is 31.01 kg/(m^2). Patient meets criteria for Obesity Class II based on current BMI.   Wt Readings from Last 10 Encounters:  02/27/12 210 lb (95.255 kg)  02/27/12 210 lb (95.255 kg)  01/16/12 204 lb 1.6 oz (92.579 kg)  07/18/11 198 lb 11.2 oz (90.13 kg)  02/16/11 186 lb (84.369 kg)  02/16/11 186 lb (84.369 kg)  12/06/10 190 lb (86.183 kg)  11/06/10 197 lb (89.359 kg)   Current diet order is NPO, except ice chips. Labs and medications reviewed.  Talked with spouse who reports good appetite and po intake PTA in fact pt has gain wt over past 6 months. Wife is requesting diet information which will be provided when diet is advanced.  No nutrition interventions warranted at this time.  #[phone]

## 2012-02-28 ENCOUNTER — Encounter (HOSPITAL_COMMUNITY): Payer: BC Managed Care – PPO

## 2012-02-28 ENCOUNTER — Encounter (HOSPITAL_COMMUNITY): Payer: Self-pay | Admitting: Anesthesiology

## 2012-02-28 ENCOUNTER — Inpatient Hospital Stay (HOSPITAL_COMMUNITY): Payer: BC Managed Care – PPO

## 2012-02-28 ENCOUNTER — Inpatient Hospital Stay (HOSPITAL_COMMUNITY): Payer: BC Managed Care – PPO | Admitting: Anesthesiology

## 2012-02-28 ENCOUNTER — Encounter (HOSPITAL_COMMUNITY): Payer: Self-pay | Admitting: Internal Medicine

## 2012-02-28 ENCOUNTER — Encounter (HOSPITAL_COMMUNITY)
Admission: EM | Disposition: A | Discharge status: Rehabilitation Facility | Payer: Self-pay | Source: Home / Self Care | Attending: Pulmonary Disease

## 2012-02-28 DIAGNOSIS — I498 Other specified cardiac arrhythmias: Secondary | ICD-10-CM

## 2012-02-28 DIAGNOSIS — A0472 Enterocolitis due to Clostridium difficile, not specified as recurrent: Secondary | ICD-10-CM | POA: Insufficient documentation

## 2012-02-28 DIAGNOSIS — R000 Tachycardia, unspecified: Secondary | ICD-10-CM | POA: Insufficient documentation

## 2012-02-28 DIAGNOSIS — K56609 Unspecified intestinal obstruction, unspecified as to partial versus complete obstruction: Secondary | ICD-10-CM

## 2012-02-28 DIAGNOSIS — Z933 Colostomy status: Secondary | ICD-10-CM | POA: Insufficient documentation

## 2012-02-28 DIAGNOSIS — K56699 Other intestinal obstruction unspecified as to partial versus complete obstruction: Secondary | ICD-10-CM | POA: Insufficient documentation

## 2012-02-28 DIAGNOSIS — N179 Acute kidney failure, unspecified: Secondary | ICD-10-CM | POA: Diagnosis not present

## 2012-02-28 HISTORY — PX: COLECTOMY: SHX59

## 2012-02-28 HISTORY — PX: ILEOSTOMY: SHX1783

## 2012-02-28 LAB — BLOOD GAS, ARTERIAL
Acid-base deficit: 14.4 mmol/L — ABNORMAL HIGH (ref 0.0–2.0)
Acid-base deficit: 15.1 mmol/L — ABNORMAL HIGH (ref 0.0–2.0)
Bicarbonate: 11.3 mEq/L — ABNORMAL LOW (ref 20.0–24.0)
Drawn by: 21310
FIO2: 70 %
O2 Content: 2 L/min
O2 Saturation: 96 %
O2 Saturation: 87.8 %
Patient temperature: 37
RATE: 16 resp/min
TCO2: 9.7 mmol/L (ref 0–100)
pCO2 arterial: 26.3 mmHg — ABNORMAL LOW (ref 35.0–45.0)
pH, Arterial: 7.257 — ABNORMAL LOW (ref 7.350–7.450)
pO2, Arterial: 82.8 mmHg (ref 80.0–100.0)
pO2, Arterial: 57.5 mmHg — ABNORMAL LOW (ref 80.0–100.0)

## 2012-02-28 LAB — CBC WITH DIFFERENTIAL/PLATELET
Band Neutrophils: 0 % (ref 0–10)
Basophils Absolute: 0 10*3/uL (ref 0.0–0.1)
Basophils Relative: 0 % (ref 0–1)
Blasts: 0 %
Eosinophils Absolute: 0 10*3/uL (ref 0.0–0.7)
Eosinophils Relative: 0 % (ref 0–5)
HCT: 54.4 % — ABNORMAL HIGH (ref 39.0–52.0)
Hemoglobin: 18.8 g/dL — ABNORMAL HIGH (ref 13.0–17.0)
Lymphocytes Relative: 16 % (ref 12–46)
Lymphs Abs: 1.1 10*3/uL (ref 0.7–4.0)
MCH: 32.6 pg (ref 26.0–34.0)
MCHC: 34.6 g/dL (ref 30.0–36.0)
MCV: 94.3 fL (ref 78.0–100.0)
Metamyelocytes Relative: 0 %
Monocytes Absolute: 1.7 10*3/uL — ABNORMAL HIGH (ref 0.1–1.0)
Monocytes Relative: 25 % — ABNORMAL HIGH (ref 3–12)
Myelocytes: 0 %
Neutro Abs: 4.1 10*3/uL (ref 1.7–7.7)
Neutrophils Relative %: 59 % (ref 43–77)
Platelets: 182 10*3/uL (ref 150–400)
Promyelocytes Absolute: 0 %
RBC: 5.77 MIL/uL (ref 4.22–5.81)
RDW: 14.3 % (ref 11.5–15.5)
WBC: 6.9 10*3/uL (ref 4.0–10.5)
nRBC: 0 /100 WBC

## 2012-02-28 LAB — BASIC METABOLIC PANEL
BUN: 31 mg/dL — ABNORMAL HIGH (ref 6–23)
BUN: 38 mg/dL — ABNORMAL HIGH (ref 6–23)
Calcium: 7.5 mg/dL — ABNORMAL LOW (ref 8.4–10.5)
Chloride: 108 mEq/L (ref 96–112)
Creatinine, Ser: 2.12 mg/dL — ABNORMAL HIGH (ref 0.50–1.35)
GFR calc Af Amer: 47 mL/min — ABNORMAL LOW (ref >90)
GFR calc Af Amer: 39 mL/min — ABNORMAL LOW (ref >90)
GFR calc non Af Amer: 40 mL/min — ABNORMAL LOW (ref >90)
Glucose, Bld: 112 mg/dL — ABNORMAL HIGH (ref 70–99)
Sodium: 135 mEq/L (ref 135–145)

## 2012-02-28 LAB — COMPREHENSIVE METABOLIC PANEL
ALT: 10 U/L (ref 0–53)
Albumin: 0.7 g/dL — ABNORMAL LOW (ref 3.5–5.2)
Alkaline Phosphatase: 19 U/L — ABNORMAL LOW (ref 39–117)
BUN: 37 mg/dL — ABNORMAL HIGH (ref 6–23)
Calcium: 5.7 mg/dL — CL (ref 8.4–10.5)
GFR calc Af Amer: 45 mL/min — ABNORMAL LOW (ref >90)
Glucose, Bld: 117 mg/dL — ABNORMAL HIGH (ref 70–99)
Potassium: 5 mEq/L (ref 3.5–5.1)
Sodium: 138 mEq/L (ref 135–145)
Total Protein: 2.3 g/dL — ABNORMAL LOW (ref 6.0–8.3)

## 2012-02-28 LAB — HEPATIC FUNCTION PANEL
ALT: 13 U/L (ref 0–53)
AST: 23 U/L (ref 0–37)
Bilirubin, Direct: 0.4 mg/dL — ABNORMAL HIGH (ref 0.0–0.3)
Total Bilirubin: 0.7 mg/dL (ref 0.3–1.2)

## 2012-02-28 LAB — POTASSIUM: Potassium: 5 mEq/L (ref 3.5–5.1)

## 2012-02-28 LAB — CBC
MCH: 31.6 pg (ref 26.0–34.0)
MCHC: 33.7 g/dL (ref 30.0–36.0)
RDW: 14.6 % (ref 11.5–15.5)

## 2012-02-28 LAB — URINE MICROSCOPIC-ADD ON

## 2012-02-28 LAB — TYPE AND SCREEN
ABO/RH(D): O NEG
Antibody Screen: NEGATIVE

## 2012-02-28 LAB — URINALYSIS, ROUTINE W REFLEX MICROSCOPIC
Glucose, UA: 100 mg/dL — AB
Ketones, ur: 15 mg/dL — AB
Nitrite: POSITIVE — AB
pH: 6.5 (ref 5.0–8.0)

## 2012-02-28 LAB — LACTIC ACID, PLASMA
Lactic Acid, Venous: 5.6 mmol/L — ABNORMAL HIGH (ref 0.5–2.2)
Lactic Acid, Venous: 6.7 mmol/L — ABNORMAL HIGH (ref 0.5–2.2)
Lactic Acid, Venous: 3.6 mmol/L — ABNORMAL HIGH (ref 0.5–2.2)

## 2012-02-28 LAB — GLUCOSE, CAPILLARY: Glucose-Capillary: 82 mg/dL (ref 70–99)

## 2012-02-28 SURGERY — COLECTOMY, TOTAL
Anesthesia: General | Site: Abdomen | Wound class: Dirty or Infected

## 2012-02-28 MED ORDER — ACETAMINOPHEN 500 MG PO TABS
ORAL_TABLET | ORAL | Status: AC
  Administered 2012-02-28: 1000 mg via NASOGASTRIC
  Filled 2012-02-28: qty 2

## 2012-02-28 MED ORDER — DEXTROSE 50 % IV SOLN
1.0000 | Freq: Once | INTRAVENOUS | Status: AC
  Administered 2012-02-28: 50 mL via INTRAVENOUS

## 2012-02-28 MED ORDER — ALBUMIN HUMAN 25 % IV SOLN
12.5000 g | Freq: Once | INTRAVENOUS | Status: AC
  Administered 2012-02-28: 12.5 g via INTRAVENOUS
  Filled 2012-02-28: qty 50

## 2012-02-28 MED ORDER — PANTOPRAZOLE SODIUM 40 MG IV SOLR
40.0000 mg | INTRAVENOUS | Status: DC
  Filled 2012-02-28: qty 40

## 2012-02-28 MED ORDER — SODIUM CHLORIDE 0.9 % IV SOLN
1.0000 g | Freq: Once | INTRAVENOUS | Status: AC
  Administered 2012-02-29: 1 g via INTRAVENOUS
  Filled 2012-02-28 (×2): qty 10

## 2012-02-28 MED ORDER — DOBUTAMINE IN D5W 4-5 MG/ML-% IV SOLN
2.5000 ug/kg/min | INTRAVENOUS | Status: DC | PRN
  Filled 2012-02-28: qty 250

## 2012-02-28 MED ORDER — PHENYLEPHRINE HCL 10 MG/ML IJ SOLN
INTRAMUSCULAR | Status: AC
  Filled 2012-02-28: qty 1

## 2012-02-28 MED ORDER — SODIUM BICARBONATE 8.4 % IV SOLN
INTRAVENOUS | Status: DC
  Administered 2012-02-28 – 2012-02-29 (×3): via INTRAVENOUS
  Filled 2012-02-28 (×8): qty 150

## 2012-02-28 MED ORDER — SODIUM CHLORIDE 0.9 % IJ SOLN
10.0000 mL | Freq: Two times a day (BID) | INTRAMUSCULAR | Status: DC
  Administered 2012-02-28 – 2012-03-02 (×6): 10 mL
  Administered 2012-03-03: 20 mL
  Administered 2012-03-04 – 2012-03-12 (×15): 10 mL
  Administered 2012-03-12 – 2012-03-13 (×2): 30 mL
  Administered 2012-03-13: 10 mL
  Administered 2012-03-14: 20 mL
  Administered 2012-03-14: 10 mL
  Administered 2012-03-15: 30 mL
  Administered 2012-03-15: 10 mL
  Administered 2012-03-16: 20 mL
  Administered 2012-03-16 – 2012-03-18 (×4): 10 mL
  Administered 2012-03-18: 20 mL
  Administered 2012-03-19 – 2012-03-27 (×12): 10 mL
  Administered 2012-03-27: 40 mL
  Administered 2012-03-28: 30 mL
  Administered 2012-03-28: 20 mL
  Administered 2012-03-28: 10 mL
  Administered 2012-03-29: 20 mL
  Administered 2012-03-30: 10 mL
  Administered 2012-03-30: 20 mL
  Administered 2012-03-31: 10 mL
  Administered 2012-03-31: 30 mL
  Administered 2012-04-01 – 2012-04-02 (×3): 20 mL
  Administered 2012-04-03: 10 mL
  Administered 2012-04-03 – 2012-04-04 (×2): 30 mL
  Administered 2012-04-04 – 2012-04-07 (×5): 10 mL
  Administered 2012-04-07: 30 mL
  Administered 2012-04-08: 20 mL
  Administered 2012-04-08 – 2012-04-09 (×2): 10 mL
  Administered 2012-04-09: 30 mL
  Administered 2012-04-10: 10 mL
  Administered 2012-04-10 – 2012-04-11 (×3): 20 mL
  Administered 2012-04-12 (×2): 10 mL
  Administered 2012-04-13: 20 mL
  Administered 2012-04-13 – 2012-04-15 (×4): 10 mL
  Administered 2012-04-15: 20 mL
  Administered 2012-04-16: 10 mL
  Administered 2012-04-16: 30 mL
  Administered 2012-04-17: 10 mL
  Filled 2012-02-28 (×2): qty 20
  Filled 2012-02-28: qty 30
  Filled 2012-02-28: qty 10

## 2012-02-28 MED ORDER — SUFENTANIL CITRATE 50 MCG/ML IV SOLN
INTRAVENOUS | Status: AC
  Filled 2012-02-28: qty 1

## 2012-02-28 MED ORDER — PHENYLEPHRINE HCL 10 MG/ML IJ SOLN
30.0000 ug/min | INTRAVENOUS | Status: DC
  Administered 2012-02-28 (×2): 100 ug/min via INTRAVENOUS
  Administered 2012-02-28: 50 ug/min via INTRAVENOUS
  Filled 2012-02-28: qty 1

## 2012-02-28 MED ORDER — SODIUM CHLORIDE 0.9 % IV SOLN
INTRAVENOUS | Status: DC | PRN
  Administered 2012-02-28 (×6): via INTRAVENOUS

## 2012-02-28 MED ORDER — SODIUM CHLORIDE 0.9 % IV BOLUS (SEPSIS)
500.0000 mL | Freq: Once | INTRAVENOUS | Status: AC
  Administered 2012-02-28: 500 mL via INTRAVENOUS

## 2012-02-28 MED ORDER — HYDROCORTISONE SOD SUCCINATE 100 MG IJ SOLR
100.0000 mg | Freq: Four times a day (QID) | INTRAMUSCULAR | Status: DC
  Administered 2012-02-28 – 2012-03-01 (×7): 100 mg via INTRAVENOUS
  Filled 2012-02-28 (×11): qty 2

## 2012-02-28 MED ORDER — SODIUM CHLORIDE 0.9 % IV BOLUS (SEPSIS)
500.0000 mL | INTRAVENOUS | Status: DC | PRN
  Administered 2012-02-29: 500 mL via INTRAVENOUS

## 2012-02-28 MED ORDER — MIDAZOLAM HCL 2 MG/2ML IJ SOLN
2.0000 mg | INTRAMUSCULAR | Status: DC | PRN
  Administered 2012-02-28 (×2): 2 mg via INTRAVENOUS
  Administered 2012-02-29 (×2): 4 mg via INTRAVENOUS
  Administered 2012-02-29: 2 mg via INTRAVENOUS
  Administered 2012-02-29 – 2012-03-01 (×5): 4 mg via INTRAVENOUS
  Administered 2012-03-01: 2 mg via INTRAVENOUS
  Administered 2012-03-01: 4 mg via INTRAVENOUS
  Administered 2012-03-01: 2 mg via INTRAVENOUS
  Filled 2012-02-28 (×2): qty 2
  Filled 2012-02-28 (×3): qty 4
  Filled 2012-02-28: qty 2
  Filled 2012-02-28 (×3): qty 4
  Filled 2012-02-28: qty 2
  Filled 2012-02-28 (×2): qty 4
  Filled 2012-02-28: qty 2

## 2012-02-28 MED ORDER — IPRATROPIUM BROMIDE 0.02 % IN SOLN
0.5000 mg | RESPIRATORY_TRACT | Status: DC
  Administered 2012-02-28 – 2012-02-29 (×5): 0.5 mg via RESPIRATORY_TRACT
  Filled 2012-02-28 (×6): qty 2.5

## 2012-02-28 MED ORDER — DEXTROSE 50 % IV SOLN
INTRAVENOUS | Status: AC
  Filled 2012-02-28: qty 50

## 2012-02-28 MED ORDER — PANTOPRAZOLE SODIUM 40 MG IV SOLR
40.0000 mg | Freq: Every day | INTRAVENOUS | Status: DC
  Administered 2012-02-28 – 2012-03-02 (×4): 40 mg via INTRAVENOUS
  Filled 2012-02-28 (×3): qty 40

## 2012-02-28 MED ORDER — BIOTENE DRY MOUTH MT LIQD
15.0000 mL | Freq: Four times a day (QID) | OROMUCOSAL | Status: DC
  Administered 2012-02-29 – 2012-03-17 (×67): 15 mL via OROMUCOSAL

## 2012-02-28 MED ORDER — VANCOMYCIN 50 MG/ML ORAL SOLUTION
500.0000 mg | Freq: Four times a day (QID) | ORAL | Status: DC
  Administered 2012-02-28: 500 mg
  Filled 2012-02-28 (×5): qty 10

## 2012-02-28 MED ORDER — NOREPINEPHRINE BITARTRATE 1 MG/ML IJ SOLN
2.0000 ug/min | INTRAVENOUS | Status: DC | PRN
  Filled 2012-02-28: qty 4

## 2012-02-28 MED ORDER — ACETAMINOPHEN 500 MG PO TABS
1000.0000 mg | ORAL_TABLET | Freq: Once | ORAL | Status: AC
  Administered 2012-02-28: 1000 mg via NASOGASTRIC

## 2012-02-28 MED ORDER — CHLORHEXIDINE GLUCONATE 0.12 % MT SOLN
15.0000 mL | Freq: Two times a day (BID) | OROMUCOSAL | Status: DC
  Administered 2012-02-28 – 2012-03-16 (×35): 15 mL via OROMUCOSAL
  Filled 2012-02-28 (×31): qty 15

## 2012-02-28 MED ORDER — SODIUM CHLORIDE 0.9 % IV SOLN
INTRAVENOUS | Status: DC
  Administered 2012-02-28 (×2): via INTRAVENOUS

## 2012-02-28 MED ORDER — ONDANSETRON HCL 4 MG/2ML IJ SOLN: 4.0000 mg | Freq: Once | INTRAMUSCULAR | Status: AC | PRN

## 2012-02-28 MED ORDER — METOPROLOL TARTRATE 1 MG/ML IV SOLN
INTRAVENOUS | Status: AC
  Administered 2012-02-28: 2.5 mg via INTRAVENOUS
  Filled 2012-02-28: qty 5

## 2012-02-28 MED ORDER — LIDOCAINE HCL (PF) 1 % IJ SOLN
INTRAMUSCULAR | Status: AC
  Filled 2012-02-28: qty 5

## 2012-02-28 MED ORDER — FENTANYL CITRATE 0.05 MG/ML IJ SOLN
25.0000 ug | INTRAMUSCULAR | Status: DC | PRN
  Administered 2012-02-29 – 2012-03-01 (×5): 50 ug via INTRAVENOUS
  Filled 2012-02-28 (×4): qty 2

## 2012-02-28 MED ORDER — ATROPINE SULFATE 1 MG/ML IJ SOLN
INTRAMUSCULAR | Status: AC
  Filled 2012-02-28: qty 1

## 2012-02-28 MED ORDER — SODIUM CHLORIDE 0.9 % IV BOLUS (SEPSIS)
1000.0000 mL | Freq: Once | INTRAVENOUS | Status: AC
  Administered 2012-02-28: 1000 mL via INTRAVENOUS

## 2012-02-28 MED ORDER — ALBUTEROL SULFATE (5 MG/ML) 0.5% IN NEBU
2.5000 mg | INHALATION_SOLUTION | RESPIRATORY_TRACT | Status: DC
  Administered 2012-02-28 – 2012-02-29 (×3): 2.5 mg via RESPIRATORY_TRACT
  Filled 2012-02-28 (×4): qty 0.5

## 2012-02-28 MED ORDER — VANCOMYCIN HCL IN DEXTROSE 1-5 GM/200ML-% IV SOLN
1000.0000 mg | Freq: Two times a day (BID) | INTRAVENOUS | Status: DC
  Administered 2012-02-28 – 2012-02-29 (×4): 1000 mg via INTRAVENOUS
  Filled 2012-02-28 (×5): qty 200

## 2012-02-28 MED ORDER — INSULIN ASPART 100 UNIT/ML ~~LOC~~ SOLN
6.0000 [IU] | Freq: Once | SUBCUTANEOUS | Status: AC
  Administered 2012-02-28: 6 [IU] via SUBCUTANEOUS

## 2012-02-28 MED ORDER — VASOPRESSIN 20 UNIT/ML IJ SOLN
INTRAMUSCULAR | Status: AC
  Filled 2012-02-28: qty 3

## 2012-02-28 MED ORDER — 0.9 % SODIUM CHLORIDE (POUR BTL) OPTIME
TOPICAL | Status: DC | PRN
  Administered 2012-02-28: 1000 mL
  Administered 2012-02-28 (×2): 2000 mL

## 2012-02-28 MED ORDER — SODIUM CHLORIDE 0.9 % IV SOLN
INTRAVENOUS | Status: DC
  Administered 2012-02-28: 19:00:00 via INTRAVENOUS

## 2012-02-28 MED ORDER — ETOMIDATE 2 MG/ML IV SOLN
INTRAVENOUS | Status: AC
  Filled 2012-02-28: qty 10

## 2012-02-28 MED ORDER — CIPROFLOXACIN IN D5W 200 MG/100ML IV SOLN
INTRAVENOUS | Status: AC
  Filled 2012-02-28: qty 200

## 2012-02-28 MED ORDER — PHENYLEPHRINE HCL 10 MG/ML IJ SOLN
30.0000 ug/min | INTRAVENOUS | Status: DC | PRN
  Filled 2012-02-28: qty 1

## 2012-02-28 MED ORDER — ROCURONIUM BROMIDE 100 MG/10ML IV SOLN
INTRAVENOUS | Status: DC | PRN
  Administered 2012-02-28: 10 mg via INTRAVENOUS
  Administered 2012-02-28: 60 mg via INTRAVENOUS
  Administered 2012-02-28 (×4): 20 mg via INTRAVENOUS

## 2012-02-28 MED ORDER — VANCOMYCIN HCL 500 MG IV SOLR
500.0000 mg | Freq: Two times a day (BID) | Status: DC
  Filled 2012-02-28 (×2): qty 500

## 2012-02-28 MED ORDER — VANCOMYCIN HCL 500 MG IV SOLR
500.0000 mg | Freq: Two times a day (BID) | Status: DC
  Administered 2012-02-28: 500 mg via RECTAL
  Filled 2012-02-28 (×3): qty 500

## 2012-02-28 MED ORDER — SUFENTANIL CITRATE 50 MCG/ML IV SOLN
INTRAVENOUS | Status: DC | PRN
  Administered 2012-02-28: 15 ug via INTRAVENOUS
  Administered 2012-02-28: 10 ug via INTRAVENOUS
  Administered 2012-02-28 (×3): 5 ug via INTRAVENOUS
  Administered 2012-02-28 (×3): 10 ug via INTRAVENOUS

## 2012-02-28 MED ORDER — SODIUM BICARBONATE 8.4 % IV SOLN
INTRAVENOUS | Status: AC
  Administered 2012-02-28: 50 meq
  Filled 2012-02-28: qty 50

## 2012-02-28 MED ORDER — VASOPRESSIN 20 UNIT/ML IJ SOLN
0.0300 [IU]/min | INTRAVENOUS | Status: DC | PRN
  Administered 2012-02-28: 0.03 [IU]/min via INTRAVENOUS
  Filled 2012-02-28: qty 2.5

## 2012-02-28 MED ORDER — LACTATED RINGERS IV SOLN: INTRAVENOUS | Status: DC

## 2012-02-28 MED ORDER — PHENOL 1.4 % MT LIQD
1.0000 | OROMUCOSAL | Status: DC | PRN
  Filled 2012-02-28: qty 177

## 2012-02-28 MED ORDER — ALBUMIN HUMAN 5 % IV SOLN
12.5000 g | Freq: Once | INTRAVENOUS | Status: DC
  Filled 2012-02-28: qty 250

## 2012-02-28 MED ORDER — MIDAZOLAM HCL 2 MG/2ML IJ SOLN
1.0000 mg | INTRAMUSCULAR | Status: DC | PRN
  Administered 2012-02-28: 2 mg via INTRAVENOUS

## 2012-02-28 MED ORDER — ROCURONIUM BROMIDE 50 MG/5ML IV SOLN
INTRAVENOUS | Status: AC
  Filled 2012-02-28: qty 1

## 2012-02-28 MED ORDER — SODIUM CHLORIDE 0.9 % IV SOLN
INTRAVENOUS | Status: DC | PRN
  Administered 2012-02-28 (×4): via INTRAVENOUS

## 2012-02-28 MED ORDER — CALCIUM CHLORIDE 10 % IV SOLN
INTRAVENOUS | Status: DC | PRN
  Administered 2012-02-28: 1 g via INTRAVENOUS

## 2012-02-28 MED ORDER — HETASTARCH-ELECTROLYTES 6 % IV SOLN
INTRAVENOUS | Status: DC | PRN
  Administered 2012-02-28 (×2): via INTRAVENOUS

## 2012-02-28 MED ORDER — PHENYLEPHRINE HCL 10 MG/ML IJ SOLN
INTRAMUSCULAR | Status: DC | PRN
  Administered 2012-02-28 (×2): 100 ug via INTRAVENOUS

## 2012-02-28 MED ORDER — CIPROFLOXACIN IN D5W 400 MG/200ML IV SOLN
400.0000 mg | INTRAVENOUS | Status: AC
  Administered 2012-02-28: 400 mg via INTRAVENOUS

## 2012-02-28 MED ORDER — ETOMIDATE 2 MG/ML IV SOLN
INTRAVENOUS | Status: DC | PRN
  Administered 2012-02-28: 14 mg via INTRAVENOUS

## 2012-02-28 MED ORDER — MIDAZOLAM HCL 2 MG/2ML IJ SOLN
INTRAMUSCULAR | Status: AC
  Filled 2012-02-28: qty 2

## 2012-02-28 MED ORDER — SODIUM BICARBONATE 8.4 % IV SOLN
INTRAVENOUS | Status: AC
  Filled 2012-02-28: qty 150

## 2012-02-28 MED ORDER — SODIUM CHLORIDE 0.9 % IJ SOLN
10.0000 mL | INTRAMUSCULAR | Status: DC | PRN
  Administered 2012-03-18 – 2012-04-07 (×2): 10 mL

## 2012-02-28 MED ORDER — NON FORMULARY: 500.0000 mg | Freq: Two times a day (BID) | Status: DC

## 2012-02-28 MED FILL — Hetastarch (HES /0.7 or /0.75) 6% in Electrolytes IV Soln: INTRAVENOUS | Qty: 1000 | Status: AC

## 2012-02-28 SURGICAL SUPPLY — 69 items
APPLIER CLIP 11 MED OPEN (CLIP)
APPLIER CLIP 13 LRG OPEN (CLIP) ×2
BAG HAMPER (MISCELLANEOUS) ×2 IMPLANT
BAG UROSTOMY 4 LOOP 90 STRL (OSTOMY) IMPLANT
BARRIER SKIN 2 1/4 (WOUND CARE) ×2 IMPLANT
BARRIER SKIN 2 3/4 (OSTOMY) IMPLANT
BARRIER SKIN WITH FLANGE 1 3/4 (OSTOMY) IMPLANT
BENZOIN TINCTURE PRP APPL 2/3 (GAUZE/BANDAGES/DRESSINGS) ×2 IMPLANT
CLAMP POUCH DRAINAGE QUIET (OSTOMY) IMPLANT
CLIP APPLIE 11 MED OPEN (CLIP) IMPLANT
CLIP APPLIE 13 LRG OPEN (CLIP) ×1 IMPLANT
CLOTH BEACON ORANGE TIMEOUT ST (SAFETY) ×2 IMPLANT
COVER LIGHT HANDLE STERIS (MISCELLANEOUS) ×4 IMPLANT
DRAPE WARM FLUID 44X44 (DRAPE) ×2 IMPLANT
DURAPREP 26ML APPLICATOR (WOUND CARE) IMPLANT
ELECT BLADE 6 FLAT ULTRCLN (ELECTRODE) ×2 IMPLANT
ELECT REM PT RETURN 9FT ADLT (ELECTROSURGICAL) ×2
ELECTRODE REM PT RTRN 9FT ADLT (ELECTROSURGICAL) ×1 IMPLANT
EVACUATOR DRAINAGE 10X20 100CC (DRAIN) ×2 IMPLANT
EVACUATOR SILICONE 100CC (DRAIN) ×2
GLOVE BIOGEL PI IND STRL 7.0 (GLOVE) ×2 IMPLANT
GLOVE BIOGEL PI IND STRL 7.5 (GLOVE) ×1 IMPLANT
GLOVE BIOGEL PI INDICATOR 7.0 (GLOVE) ×2
GLOVE BIOGEL PI INDICATOR 7.5 (GLOVE) ×1
GLOVE ECLIPSE 6.5 STRL STRAW (GLOVE) ×4 IMPLANT
GLOVE ECLIPSE 7.0 STRL STRAW (GLOVE) ×2 IMPLANT
GLOVE EXAM NITRILE MD LF STRL (GLOVE) ×2 IMPLANT
GOWN STRL REIN XL XLG (GOWN DISPOSABLE) ×12 IMPLANT
INST SET MAJOR GENERAL (KITS) ×2 IMPLANT
KIT ROOM TURNOVER APOR (KITS) ×2 IMPLANT
LIGASURE IMPACT 36 18CM CVD LR (INSTRUMENTS) ×2 IMPLANT
MANIFOLD NEPTUNE II (INSTRUMENTS) ×2 IMPLANT
NS IRRIG 1000ML POUR BTL (IV SOLUTION) ×10 IMPLANT
PACK ABDOMINAL MAJOR (CUSTOM PROCEDURE TRAY) ×2 IMPLANT
PAD ARMBOARD 7.5X6 YLW CONV (MISCELLANEOUS) ×2 IMPLANT
POUCH OSTOMY 2 3/4  H 3804 (WOUND CARE) ×1
POUCH OSTOMY 2 PC DRNBL 2.25 (WOUND CARE) IMPLANT
POUCH OSTOMY 2 PC DRNBL 2.75 (WOUND CARE) ×1 IMPLANT
POUCH OSTOMY DRNBL 2 1/4 (WOUND CARE)
RELOAD LINEAR CUT PROX 55 BLUE (ENDOMECHANICALS) IMPLANT
RELOAD PROXIMATE 75MM BLUE (ENDOMECHANICALS) IMPLANT
SET BASIN LINEN APH (SET/KITS/TRAYS/PACK) ×2 IMPLANT
SPONGE DRAIN TRACH 4X4 STRL 2S (GAUZE/BANDAGES/DRESSINGS) ×4 IMPLANT
SPONGE GAUZE 4X4 12PLY (GAUZE/BANDAGES/DRESSINGS) ×2 IMPLANT
SPONGE LAP 18X18 X RAY DECT (DISPOSABLE) ×6 IMPLANT
STAPLER AUT SUT LDS 15W (STAPLE) IMPLANT
STAPLER GUN LINEAR PROX 60 (STAPLE) ×2 IMPLANT
STAPLER PROXIMATE 55 BLUE (STAPLE) IMPLANT
STAPLER PROXIMATE 75MM BLUE (STAPLE) ×2 IMPLANT
STAPLER VISISTAT 35W (STAPLE) ×4 IMPLANT
SUCTION POOLE TIP (SUCTIONS) ×6 IMPLANT
SUT CHROMIC 3 0 SH 27 (SUTURE) IMPLANT
SUT ETHILON 3 0 FSL (SUTURE) ×2 IMPLANT
SUT NOVA NAB GS-26 0 60 (SUTURE) IMPLANT
SUT PDS AB 0 CTX 60 (SUTURE) ×4 IMPLANT
SUT PROLENE NAB BLUE 3-0 30IN (SUTURE) ×2 IMPLANT
SUT SILK 2 0 (SUTURE)
SUT SILK 2-0 18XBRD TIE 12 (SUTURE) IMPLANT
SUT SILK 3 0 SH CR/8 (SUTURE) ×4 IMPLANT
SUT VIC AB 2-0 CT1 27 (SUTURE)
SUT VIC AB 2-0 CT1 TAPERPNT 27 (SUTURE) IMPLANT
SUT VIC AB 3-0 SH 27 (SUTURE) ×2
SUT VIC AB 3-0 SH 27X BRD (SUTURE) ×2 IMPLANT
SYR BULB IRRIGATION 50ML (SYRINGE) ×2 IMPLANT
TAPE CLOTH SURG 4X10 WHT LF (GAUZE/BANDAGES/DRESSINGS) ×2 IMPLANT
TOWEL BLUE STERILE X RAY DET (MISCELLANEOUS) IMPLANT
TOWEL OR 17X26 4PK STRL BLUE (TOWEL DISPOSABLE) IMPLANT
TRAY FOLEY CATH 14FR (SET/KITS/TRAYS/PACK) IMPLANT
YANKAUER SUCT BULB TIP 10FT TU (MISCELLANEOUS) ×4 IMPLANT

## 2012-02-28 NOTE — Progress Notes (Signed)
[place] Progress Note Patient Name: Connor Small DOB: 1960-05-31 MRN: [id number]  Date of Service  02/28/2012   HPI/Events of Note  Hypocalcemia   eICU Interventions  Calcium replaced   Intervention Category Intermediate Interventions: Electrolyte abnormality - evaluation and management  DETERDING,ELIZABETH 02/28/2012, 11:59 PM

## 2012-02-28 NOTE — Addendum Note (Signed)
Addendum  created 02/28/12 0950 by Vista Deck, CRNA   Modules edited:Charges VN

## 2012-02-28 NOTE — Progress Notes (Addendum)
Subjective: C/o pain.  Thirsty  Objective: Vital signs in last 24 hours: Filed Vitals:   02/28/12 0400 02/28/12 0500 02/28/12 0600 02/28/12 0811  BP: 91/56 88/67 97/61    Pulse: 144 146 27 150  Temp: 99.8 F (37.7 C)     TempSrc: Axillary     Resp: 25 34 24 22  Height:      Weight:  99.9 kg (220 lb 3.8 oz)    SpO2: 93% 93% 93% 94%   Weight change: 8.481 kg (18 lb 11.1 oz)  Intake/Output Summary (Last 24 hours) at 02/28/12 0854 Last data filed at 02/28/12 0600  Gross per 24 hour  Intake 8125.5 ml  Output   3225 ml  Net 4900.5 ml   Telemetry: Sinus tachycardia rate 150  General: Asleep. Arousable. Groggy. Comfortable. NG tube draining bilious fluid Lungs clear to auscultation bilaterally without wheeze rhonchi or rales Cardiovascular fast, regular no murmurs gallops rubs Abdomen: No bowel sounds. Distended, but less so than yesterday preoperatively. Dressing with sanguinous drainage. Cecostomy tube draining slightly blood-tinged fluid Extremities sequential compression devices  Lab Results: Basic Metabolic Panel:  Lab 52/84/13 0433 02/27/12 0500  NA 135 134*  K 5.0 4.4  CL 108 104  CO2 15* 18*  GLUCOSE 112* 127*  BUN 31* 22  CREATININE 1.83* 1.00  CALCIUM 7.5* 7.6*  MG -- --  PHOS -- --   Liver Function Tests:  Lab 02/27/12 0500 02/26/12 1409  AST 14 14  ALT 13 14  ALKPHOS 61 59  BILITOT 0.9 1.3*  PROT 5.9* 6.4  ALBUMIN 2.6* 3.1*    Lab 02/26/12 1409  LIPASE 9*  AMYLASE --   No results found for this basename: AMMONIA:2 in the last 168 hours CBC:  Lab 02/28/12 0433 02/27/12 0500  WBC 6.9 10.2  NEUTROABS 4.1 8.2*  HGB 18.8* 17.1*  HCT 54.4* 47.9  MCV 94.3 92.8  PLT 182 165   Cardiac Enzymes:  Lab 02/26/12 1409  CKTOTAL --  CKMB --  CKMBINDEX --  TROPONINI <0.30    Lab 02/26/12 1627  COLORURINE YELLOW  LABSPEC 1.020  PHURINE 5.5  GLUCOSEU NEGATIVE  HGBUR LARGE*  BILIRUBINUR SMALL*  KETONESUR TRACE*  PROTEINUR 100*  UROBILINOGEN  0.2  NITRITE NEGATIVE  LEUKOCYTESUR NEGATIVE    Micro Results: Recent Results (from the past 240 hour(s))  CULTURE, BLOOD (ROUTINE X 2)     Status: Normal (Preliminary result)   Collection Time   02/27/12  4:58 AM      Component Value Range Status Comment   Specimen Description BLOOD RIGHT ARM   Final    Special Requests BOTTLES DRAWN AEROBIC AND ANAEROBIC 6CC   Final    Culture NO GROWTH <24 HRS   Final    Report Status PENDING   Incomplete   CULTURE, BLOOD (ROUTINE X 2)     Status: Normal (Preliminary result)   Collection Time   02/27/12  5:15 AM      Component Value Range Status Comment   Specimen Description BLOOD RIGHT HAND   Final    Special Requests BOTTLES DRAWN AEROBIC AND ANAEROBIC 6CC   Final    Culture NO GROWTH <24 HRS   Final    Report Status PENDING   Incomplete   SURGICAL PCR SCREEN     Status: Normal   Collection Time   02/27/12  3:15 PM      Component Value Range Status Comment   MRSA, PCR NEGATIVE  NEGATIVE Final  Staphylococcus aureus NEGATIVE  NEGATIVE Final    Studies/Results: Ct Abdomen Pelvis W Contrast  02/26/2012  *RADIOLOGY REPORT*  Clinical Data: Diarrhea, body aches, prior colon surgery with colostomy for perforation  CT ABDOMEN AND PELVIS WITH CONTRAST  Technique:  Multidetector CT imaging of the abdomen and pelvis was performed following the standard protocol during bolus administration of intravenous contrast.  Contrast: 100 ml Omnipaque-300 IV  Comparison: 11/06/2010  Findings: Lung bases are essentially clear.  Liver, spleen, pancreas, and adrenal glands are within normal limits.  Gallbladder is unremarkable.  No intrahepatic or extrahepatic ductal dilatation.  Three left renal cysts measuring up to 1.7 cm.  Right kidney is within normal limits.  No hydronephrosis.  No evidence of bowel obstruction.  Normal appendix.  Prior distal colonic resection with reanastomosis in the pelvis (series 2/image 74).  Moderate stool in the colon.  Atherosclerotic  calcifications of the abdominal aorta and branch vessels.  No abdominopelvic ascites.  No suspicious abdominopelvic lymphadenopathy.  Small fat-containing left abdominal hernia at the site of prior colostomy (series 2/image 7).  Prostate is unremarkable.  Bladder is within normal limits.  Mild degenerative changes of the visualized thoracolumbar spine.  IMPRESSION: No evidence of bowel obstruction.  Normal appendix.  Prior distal colonic resection with reanastomosis.  Moderate stool throughout the colon, suggesting constipation.  Otherwise, no CT findings to account for the patient's abdominal pain.   Original Report Authenticated By: Julian Hy, M.D.    Dg Chest Port 1 View  02/27/2012  *RADIOLOGY REPORT*  Clinical Data: Fever with body aches.  PORTABLE CHEST - 1 VIEW  Comparison: None.  Findings: 0517 hours.  Left lung is clear.  There is a probable subsegmental atelectasis at the right base. Cardiopericardial silhouette is at upper limits of normal for size. Telemetry leads overlie the chest.  IMPRESSION: Low volume film with probable right basilar atelectasis.  There is some fullness in the infrahilar regions bilaterally.  Dedicated PA and lateral follow-up film recommended after resolution of acute symptoms.   Original Report Authenticated By: Misty Stanley, M.D.    Dg Abd 2 Views  02/27/2012  *RADIOLOGY REPORT*  Clinical Data: Abdominal pain.  Evaluate for obstruction.  History of ulcerative colitis.  ABDOMEN - 2 VIEW  Comparison: CT scan from 02/26/2012  Findings: Upright film shows no evidence for intraperitoneal free air.  Upright film shows multiple colonic large air-fluid levels. Supine film shows diffuse gaseous colonic distention in the transverse and left colon.  Contrast material in the urinary bladder is compatible with the CT scan yesterday.  No gaseous small bowel dilatation.  IMPRESSION: Gaseous distention of colon noted down to the level of the rectum. Given the history of ulcerative  colitis and rectosigmoid anastomoses, anastomotic stricture would be a consideration.  I personally discussed the results of this study with Dr. Conley Canal at to 0910 hours on 02/27/2012.   Original Report Authenticated By: Misty Stanley, M.D.    Scheduled Meds:    . metronidazole  500 mg Intravenous Q6H  . sodium chloride  1,000 mL Intravenous Once  . sodium chloride  3 mL Intravenous Q12H  . vancomycin  500 mg Per Tube Q6H  . vancomycin  1,000 mg Intravenous Q12H   Continuous Infusions:    . 0.9 % NaCl with KCl 20 mEq / L 175 mL/hr at 02/28/12 0600   PRN Meds:.HYDROmorphone (DILAUDID) injection, ondansetron (ZOFRAN) IV, ondansetron Assessment/Plan:   C. difficile colitis: patient is on IV metronidazole. Vancomycin per G-tube is not  likely to be getting to the colon, as he has no bowel sounds. Discussed the case with infectious disease. Dr. Johnnye Sima recommend intracolonic vancomycin. Patient has a stricture or so vancomycin enemas may not reach the entire colon. Dr. Laural Golden will order. We also discussed the possibility of vancomycin per cecostomy tube. will discuss this with Dr. Bernette Mayers. Infectious disease reports there may be some benefit to IV vancomycin. Could also try IVIG, but will discuss with hematology first. If we can get intracolonic vancomycin we may be able to avoid IVIG. Stop proton pump inhibitor, as this will render C. difficile more difficult to treat.  S/P cecostomy  ARF (acute renal failure) secondary to acute illness, hypoalbuminemia, third spacing. Agree with pushing fluids. Will get a PICC line and monitor CVP. Repeat BMET today at 9 and again in the morning. Monitor urine output carefully.  UC (ulcerative colitis)  Sinus tachycardia secondary to above, compensatory. Would NOT treat with beta blockers   Hematuria, new since yesterday. 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 Will discuss with family who are not currently available. Appreciate consultants. Critical care time 40 minutes.   LOS: 2 days   Hiya Point L 02/28/2012, 8:54 AM

## 2012-02-28 NOTE — Anesthesia Postprocedure Evaluation (Signed)
  Anesthesia Post-op Note  Patient: Connor Small  Procedure(s) Performed: Procedure(s) (LRB) with comments: TOTAL COLECTOMY (N/A) ILEOSTOMY (N/A)  Patient Location: PACU and ICU  Anesthesia Type:General  Level of Consciousness: sedated  Airway and Oxygen Therapy: Patient remains intubated per anesthesia plan and Patient placed on Ventilator (see vital sign flow sheet for setting)  Post-op Pain: none  Post-op Assessment: Post-op Vital signs reviewed, Patient's Cardiovascular Status Stable, Respiratory Function Stable, Patent Airway and No signs of Nausea or vomiting  Post-op Vital Signs: Reviewed 95/52 126 o2 [id number] Ventilated via ETT  Complications: No apparent anesthesia complications

## 2012-02-28 NOTE — Progress Notes (Signed)
UR Chart Review Completed  

## 2012-02-28 NOTE — Progress Notes (Signed)
ANTIBIOTIC CONSULT NOTE - INITIAL  Pharmacy Consult for Vancomycin Indication: Cdiff   Allergies  Allergen Reactions  . Penicillins Other (See Comments)    Heart rate changes    Patient Measurements: Height: 5' 9"  (175.3 cm) Weight: 220 lb 3.8 oz (99.9 kg) IBW/kg (Calculated) : 70.7   Vital Signs: Temp: 97.9 F (36.6 C) (02/04 1407) Temp src: Oral (02/04 1407) BP: 102/67 mmHg (02/04 1420) Pulse Rate: 37  (02/04 1325) Intake/Output from previous day: 02/03 0701 - 02/04 0700 In: 8125.5 [P.O.:240; I.V.:7585.5; IV Piggyback:300] Out: 3225 [Urine:775; Emesis/NG output:300; Blood:150] Intake/Output from this shift:    Labs:  Basename 02/28/12 1143 02/28/12 0433 02/27/12 0500 02/26/12 1409  WBC -- 6.9 10.2 13.8*  HGB -- 18.8* 17.1* 17.2*  PLT -- 182 165 134*  LABCREA -- -- -- --  CREATININE 2.12* 1.83* 1.00 --   Estimated Creatinine Clearance: 47 ml/min (by C-G formula based on Cr of 2.12). No results found for this basename: VANCOTROUGH:2,VANCOPEAK:2,VANCORANDOM:2,GENTTROUGH:2,GENTPEAK:2,GENTRANDOM:2,TOBRATROUGH:2,TOBRAPEAK:2,TOBRARND:2,AMIKACINPEAK:2,AMIKACINTROU:2,AMIKACIN:2, in the last 72 hours   Microbiology: Recent Results (from the past 720 hour(s))  CULTURE, BLOOD (ROUTINE X 2)     Status: Normal (Preliminary result)   Collection Time   02/27/12  4:58 AM      Component Value Range Status Comment   Specimen Description BLOOD RIGHT ARM   Final    Special Requests BOTTLES DRAWN AEROBIC AND ANAEROBIC 6CC   Final    Culture NO GROWTH 1 DAY   Final    Report Status PENDING   Incomplete   CULTURE, BLOOD (ROUTINE X 2)     Status: Normal (Preliminary result)   Collection Time   02/27/12  5:15 AM      Component Value Range Status Comment   Specimen Description BLOOD RIGHT HAND   Final    Special Requests BOTTLES DRAWN AEROBIC AND ANAEROBIC 6CC   Final    Culture NO GROWTH 1 DAY   Final    Report Status PENDING   Incomplete   SURGICAL PCR SCREEN     Status: Normal   Collection Time   02/27/12  3:15 PM      Component Value Range Status Comment   MRSA, PCR NEGATIVE  NEGATIVE Final    Staphylococcus aureus NEGATIVE  NEGATIVE Final     Medical History: Past Medical History  Diagnosis Date  . Chronic diarrhea   . Rectal bleed   . Hemorrhoids   . Ulcerative colitis     Distal UC over 8 yrs ago diagnosed  . Diverticulitis of large intestine with perforation 10/2011    done at [facility]    Medications:  Scheduled:    . [COMPLETED] acetaminophen  1,000 mg Per NG tube Once  . ciprofloxacin  400 mg Intravenous On Call to OR  . [COMPLETED] dextrose  1 ampule Intravenous Once  . dextrose      . [COMPLETED] enoxaparin (LOVENOX) injection  40 mg Subcutaneous Once  . [COMPLETED]  HYDROmorphone (DILAUDID) injection  1 mg Intravenous Once  . [COMPLETED] insulin aspart  6 Units Subcutaneous Once  . [EXPIRED] meperidine      . [COMPLETED] metoprolol  2.5 mg Intravenous Once  . metronidazole  500 mg Intravenous Q6H  . [COMPLETED] sodium bicarbonate      . sodium chloride  1,000 mL Intravenous Once  . [COMPLETED] sodium chloride  500 mL Intravenous Once  . [COMPLETED] sodium chloride  500 mL Intravenous Once  . [COMPLETED] sodium chloride  500 mL Intravenous Once  . [  COMPLETED] sodium chloride  500 mL Intravenous Once  . sodium chloride  10-40 mL Intracatheter Q12H  . sodium chloride  3 mL Intravenous Q12H  . vancomycin  500 mg Per Tube Q6H  . vancomycin (VANCOCIN) rectal ENEMA  500 mg Rectal BID  . vancomycin  1,000 mg Intravenous Q12H  . [DISCONTINUED] ciprofloxacin  400 mg Intravenous Once  . [DISCONTINUED] ciprofloxacin  400 mg Intravenous Q12H  . [DISCONTINUED] metronidazole  500 mg Intravenous Once  . [DISCONTINUED] metronidazole  500 mg Intravenous Q8H  . [DISCONTINUED] mupirocin ointment   Nasal BID  . [DISCONTINUED] NON FORMULARY 500 mg  500 mg Rectal BID  . [DISCONTINUED] pantoprazole (PROTONIX) IV  40 mg Intravenous Q24H  . [DISCONTINUED]  vancomycin  250 mg Oral Q6H  . [DISCONTINUED] vancomycin  500 mg Intravenous Once   Assessment: 52 yo M with severe Cdiff colitis/?sepsis.  Per ID recommendations will add IV Vancomycin & PR vancomycin to oral vancomycin & IV Flagyl regimen to ensure vancomycin is adequately reaching all areas of the colon.   His renal function continues to decline (Scr 1.0-->1.83-->2.12 in past 24 hrs) and 24hr I/O +4.9L yesterday.    Goal of Therapy:  Vancomycin trough level 15-20 mcg/ml  Plan:  1) Vancomycin 1gm IV Q12h 2) Check Vancomycin trough at steady state 3) Monitor renal function and cx data   Biagio Borg 02/28/2012,2:33 PM

## 2012-02-28 NOTE — Progress Notes (Signed)
Patient reevaluated. Remains tachycardic. Blood pressure soft. Abdomen more distended and quiet. Minimal urine output since this morning. Creatinine continues to rise. Also  More acidotic. Discussed the case with Dr. Melony Overly and Dr. Bernette Mayers. Recommend total colectomy for severe C. difficile infection failing medical therapy. Have given an amp of bicarbonate, D50 and insulin, 500 cc bolus of saline, consulted nephrology. Discussed with family. Critical care time 20 minutes.

## 2012-02-28 NOTE — Addendum Note (Signed)
Addendum  created 02/28/12 1815 by Ollen Bowl, CRNA   Modules edited:Anesthesia LDA

## 2012-02-28 NOTE — Anesthesia Preprocedure Evaluation (Signed)
Anesthesia Evaluation  Patient identified by MRN, date of birth, ID band Patient awake    Reviewed: Allergy & Precautions, H&P , NPO status , Patient's Chart, lab work & pertinent test results, reviewed documented beta blocker date and time   Airway Mallampati: I TM Distance: >3 FB Neck ROM: Full    Dental  (+) Teeth Intact   Pulmonary  breath sounds clear to auscultation        Cardiovascular Rate:Tachycardia  Tachycardia   Neuro/Psych    GI/Hepatic PUD, Colonic obstrucion   Endo/Other    Renal/GU      Musculoskeletal   Abdominal (+)  Abdomen: rigid and tender.    Peds  Hematology   Anesthesia Other Findings Pt with worsening clinical picture, abdom distention, acidosis, hyperkalemia. Very tachycardic secondary to third spacing and volume depletion.  Reproductive/Obstetrics                           Anesthesia Physical Anesthesia Plan  ASA: III and emergent  Anesthesia Plan: General   Post-op Pain Management:    Induction: Intravenous, Rapid sequence and Cricoid pressure planned  Airway Management Planned: Oral ETT  Additional Equipment:   Intra-op Plan:   Post-operative Plan: Post-operative intubation/ventilation  Informed Consent: I have reviewed the patients History and Physical, chart, labs and discussed the procedure including the risks, benefits and alternatives for the proposed anesthesia with the patient or authorized representative who has indicated his/her understanding and acceptance.     Plan Discussed with: CRNA and Surgeon  Anesthesia Plan Comments: (Sub glottic ETT Fluid resuscitation. RSI with nondepolarizing agent due to hyperkalemia. Post op vent support.)        Anesthesia Quick Evaluation

## 2012-02-28 NOTE — Progress Notes (Signed)
DR ziegler updated on pt status

## 2012-02-28 NOTE — Anesthesia Procedure Notes (Signed)
Procedure Name: Intubation Date/Time: 02/28/2012 2:36 PM Performed by: [name] E Pre-anesthesia Checklist: Patient identified, Patient being monitored, Timeout performed, Emergency Drugs available and Suction available Patient Re-evaluated:Patient Re-evaluated prior to inductionOxygen Delivery Method: Circle System Utilized Preoxygenation: Pre-oxygenation with 100% oxygen Intubation Type: IV induction, Rapid sequence and Cricoid Pressure applied Ventilation: Mask ventilation without difficulty Laryngoscope Size: Mac and 3 Grade View: Grade I Tube type: Oral Tube size: 8.0 mm Number of attempts: 1 Airway Equipment and Method: stylet Placement Confirmation: ETT inserted through vocal cords under direct vision,  positive ETCO2 and breath sounds checked- equal and bilateral Secured at: 21 cm Tube secured with: Tape Dental Injury: Teeth and Oropharynx as per pre-operative assessment  Comments: Subglottic ETT with anticipation of leaving pt intubated post op

## 2012-02-28 NOTE — Op Note (Signed)
Patient:  Connor Small  DOB:  [birth year]  MRN:  [id number]   Preop Diagnosis:  Colonic stricture, and megacolon  Postop Diagnosis:  The same  Procedure:  Exploratory laparotomy, cecostomy tube placement  Surgeon:  Dr. Tilford Pillar  Anes:  General endotracheal  Indications:  Patient is a 52 year old male presented to [facility] with flulike symptoms. During his course in the hospital he developed abdominal distention. He was evaluated by Dr. Karilyn Cota and underwent attempted colonoscopy which demonstrated a low colorectal stricture the previous suspected anastomotic site. Patient had profound distention and surgical consultation was obtained. Patient had evidence of pseudomembranes on his colonoscopy within the rectal vault. Due to his profound tachycardia and increasing abdominal distention surgical options were discussed. Exploratory laparotomy possible bowel resection possible cecostomy tube placement were discussed with the patient and family. Risks benefits alternatives were discussed at length. Her questions and concerns were addressed. Patient was consented for the planned procedure.  Procedure note:  Patient is taken to the operating room placed in supine position the or table time the general anesthetic is administered. Once patient was asleep he was endotracheally intubated by the nurse anesthetist. At this point a Foley catheter is placed in standard sterile fashion by myself without difficulty. His abdomen is then prepped with DuraPrep solution and draped in standard fashion. Time out was performed. His previous midline incisional scar is excised with a 10 blade scalpel. Additional dissection down to subcuticular tissues carried out using electrocautery down to the fascia. The fascia grasped Coker clamps and elevated. Sharp dissection into the abdominal cavity is carried out. Some clear ascitic fluid was encountered. Several loops of small and bowel are seen. There is no evidence  of any nonviable small intestine. No dilatation of the small intestine. The incision is further a large both inferiorly and superiorly. The cecum was identified and was noted the severely distended. The serosa is split along the tinea however the serosa continues to have a viable appearance. The transverse colon is identified and is noted to also be viable but distended. The sigmoid colon was identified and is viable. Rectum is viable. At this point lap sponges are placed around the cecum and the cecum is decompressed by creating a small defect with a scalpel. Prior to this a Pershing suture was placed around the planned site of the colostomy. The postop shoe and tip is placed into the cecum. A copious amount of liquid stool and secretions is removed. The colon is not decompressed along its entirety. Reinspection of the colon demonstrates viable-appearing serosa. The serosal tear is reapproximated using 3-0 silk suture and imbricating fashion. I did think. A small left lateral abdominal wall defect to hold the cecum for a formal cecostomy. Due to the distention of the cecum I opted to additionally place a retrobulbar catheter through the abdominal wall into the cecum. The pursestring suture was secured around the rubber catheter. This is an 25 French red rubber catheter with the tip of the catheter cut to allow adequate flushing and aspiration through the catheter. The cecum was then pexed to the anterior normal wall with 30 silks around 4 quadrants of the pexing. The catheter was easily flushed and returning aspirate was consistent with liquid stool. The red rubber catheter was then secured to the skin with a 3-0 nylon. The remainder of the skin defect was closed with 3-0 nylon in interrupted fashion. At this point all surgical team gowns and gloves were changed. The abdominal cavity was irrigated  with a copious amount of warm saline. The returning aspirate was clear. Hemostasis is excellent. I did inspect the  pelvis and was able to palpate the stricture although this was low below the sacral promontory at the level of the pelvic floor. Due to the patient's preop hemodynamic status I opted to conclude the current procedure and the proceed to placement of the patient to the intensive care unit. A piece of Seprafilm was placed into the pelvis the to likely return for future addressed the colon rectal stricture and revision of the anastomosis. The omentum and small intestine was returned to its proper orientation. The fascial edges were reapproximated using a oh looped PDS x2. The subcuticular tissue was irrigated and the skin edges were reapproximated using skin staples. Sterile dressings were placed. The drapes removed and the dressings were secured. The patient was extubated and transferred to the PACU and is tentatively to the intensive care unit upon initial recovery from anesthesia. At the conclusion of the procedure all instrument, sponge, needle counts are correct.  Patient demonstrated initial improvement and tolerated procedure.  Complications:  None apparent  EBL:  Less than 150 ML's  Specimen:  None  Findings: Patient demonstrated intraoperative improvement of urine output with lightening of urine and increased output following decompression. Patient remained tachycardic, his heart rate improved significantly upon decompression.

## 2012-02-28 NOTE — Consult Note (Signed)
Reason for Consult: Acute kidney injury Referring Physician: [name] is an 52 y.o. male.  HPI: Patient with history of ulcerative colitis, diverticulitis with intestinal perforation previous colostomy followed by takedown of his colostomy presently came with complaints of back pain some diarrhea. When he was evaluated patient was found to also fever with hypotension and possible to sepsis was entertained. Presently patient is status post colectomy and he is intubated. Consult is called because of elevated pending creatinine. Since patient is intubated unable to get any additional history.  Past Medical History  Diagnosis Date  . Chronic diarrhea   . Rectal bleed   . Hemorrhoids   . Ulcerative colitis     Distal UC over 8 yrs ago diagnosed  . Diverticulitis of large intestine with perforation 10/2011    done at [facility]    Past Surgical History  Procedure Date  . Colonoscopy 9/03  . Sigmoidoscopy      06/13/2002  . Temporary ostomy November 06, 2010     for a colon perforation that was done in [facility] (Dr Connor Small Small).    . Colonoscopy 02/16/2011    Procedure: COLONOSCOPY;  Surgeon: Connor Houston, MD;  Location: [facility];  Service: Endoscopy;  Laterality: N/A;  100  . Flexible sigmoidoscopy 02/27/2012    Procedure: FLEXIBLE SIGMOIDOSCOPY;  Surgeon: Connor Houston, MD;  Location: [facility];  Service: Endoscopy;  Laterality: N/A;  with colonic decompression  . Laparotomy 02/27/2012    Procedure: EXPLORATORY LAPAROTOMY;  Surgeon: Connor Heinz, MD;  Location: [facility];  Service: General;  Laterality: N/A;  . Cecostomy 02/27/2012    Procedure: CECOSTOMY;  Surgeon: Connor Heinz, MD;  Location: [facility];  Service: General;  Laterality: N/A;  Cecostomy Tube Placement    Family History  Problem Relation Age of Onset  . Cancer Sister   . Healthy Daughter   . Hypertension Mother     Social History:  reports that he quit smoking about 27 years ago. His smoking  use included Cigarettes. He has never used smokeless tobacco. He reports that he does not drink alcohol or use illicit drugs.  Allergies:  Allergies  Allergen Reactions  . Penicillins Other (See Comments)    Heart rate changes    Medications: I have reviewed the patient's current medications.  Results for orders placed during the hospital encounter of 02/26/12 (from the past 48 hour(s))  CULTURE, BLOOD (ROUTINE X 2)     Status: Normal (Preliminary result)   Collection Time   02/27/12  4:58 AM      Component Value Range Comment   Specimen Description BLOOD RIGHT ARM      Special Requests BOTTLES DRAWN AEROBIC AND ANAEROBIC 6CC      Culture NO GROWTH 1 DAY      Report Status PENDING     BASIC METABOLIC PANEL     Status: Abnormal   Collection Time   02/27/12  5:00 AM      Component Value Range Comment   Sodium 134 (*) 135 - 145 mEq/L    Potassium 4.4  3.5 - 5.1 mEq/L    Chloride 104  96 - 112 mEq/L    CO2 18 (*) 19 - 32 mEq/L    Glucose, Bld 127 (*) 70 - 99 mg/dL    BUN 22  6 - 23 mg/dL    Creatinine, Ser 1.00  0.50 - 1.35 mg/dL    Calcium 7.6 (*) 8.4 -  10.5 mg/dL    GFR calc non Af Amer 84 (*) >90 mL/min    GFR calc Af Amer >90  >90 mL/min   CBC WITH DIFFERENTIAL     Status: Abnormal   Collection Time   02/27/12  5:00 AM      Component Value Range Comment   WBC 10.2  4.0 - 10.5 K/uL    RBC 5.16  4.22 - 5.81 MIL/uL    Hemoglobin 17.1 (*) 13.0 - 17.0 g/dL    HCT 47.9  39.0 - 52.0 %    MCV 92.8  78.0 - 100.0 fL    MCH 33.1  26.0 - 34.0 pg    MCHC 35.7  30.0 - 36.0 g/dL    RDW 13.7  11.5 - 15.5 %    Platelets 165  150 - 400 K/uL    Neutrophils Relative 80 (*) 43 - 77 %    Neutro Abs 8.2 (*) 1.7 - 7.7 K/uL    Lymphocytes Relative 6 (*) 12 - 46 %    Lymphs Abs 0.6 (*) 0.7 - 4.0 K/uL    Monocytes Relative 14 (*) 3 - 12 %    Monocytes Absolute 1.5 (*) 0.1 - 1.0 K/uL    Eosinophils Relative 0  0 - 5 %    Eosinophils Absolute 0.0  0.0 - 0.7 K/uL    Basophils Relative 0  0 - 1 %     Basophils Absolute 0.0  0.0 - 0.1 K/uL    WBC Morphology INCREASED BANDS (>20% BANDS)     LACTIC ACID, PLASMA     Status: Abnormal   Collection Time   02/27/12  5:00 AM      Component Value Range Comment   Lactic Acid, Venous 3.3 (*) 0.5 - 2.2 mmol/L   HEPATIC FUNCTION PANEL     Status: Abnormal   Collection Time   02/27/12  5:00 AM      Component Value Range Comment   Total Protein 5.9 (*) 6.0 - 8.3 g/dL    Albumin 2.6 (*) 3.5 - 5.2 g/dL    AST 14  0 - 37 U/L    ALT 13  0 - 53 U/L    Alkaline Phosphatase 61  39 - 117 U/L    Total Bilirubin 0.9  0.3 - 1.2 mg/dL    Bilirubin, Direct 0.3  0.0 - 0.3 mg/dL    Indirect Bilirubin 0.6  0.3 - 0.9 mg/dL   CULTURE, BLOOD (ROUTINE X 2)     Status: Normal (Preliminary result)   Collection Time   02/27/12  5:15 AM      Component Value Range Comment   Specimen Description BLOOD RIGHT HAND      Special Requests BOTTLES DRAWN AEROBIC AND ANAEROBIC 6CC      Culture NO GROWTH 1 DAY      Report Status PENDING     SEDIMENTATION RATE     Status: Normal   Collection Time   02/27/12  9:22 AM      Component Value Range Comment   Sed Rate 3  0 - 16 mm/hr   C-REACTIVE PROTEIN     Status: Abnormal   Collection Time   02/27/12  9:22 AM      Component Value Range Comment   CRP 24.7 (*) <0.60 mg/dL   LACTIC ACID, PLASMA     Status: Abnormal   Collection Time   02/27/12  9:24 AM      Component Value Range Comment  Lactic Acid, Venous 3.8 (*) 0.5 - 2.2 mmol/L   SURGICAL PCR SCREEN     Status: Normal   Collection Time   02/27/12  3:15 PM      Component Value Range Comment   MRSA, PCR NEGATIVE  NEGATIVE    Staphylococcus aureus NEGATIVE  NEGATIVE   BASIC METABOLIC PANEL     Status: Abnormal   Collection Time   02/28/12  4:33 AM      Component Value Range Comment   Sodium 135  135 - 145 mEq/L    Potassium 5.0  3.5 - 5.1 mEq/L    Chloride 108  96 - 112 mEq/L    CO2 15 (*) 19 - 32 mEq/L    Glucose, Bld 112 (*) 70 - 99 mg/dL    BUN 31 (*) 6 - 23 mg/dL     Creatinine, Ser 1.83 (*) 0.50 - 1.35 mg/dL DELTA CHECK NOTED   Calcium 7.5 (*) 8.4 - 10.5 mg/dL    GFR calc non Af Amer 40 (*) >90 mL/min    GFR calc Af Amer 47 (*) >90 mL/min   CBC WITH DIFFERENTIAL     Status: Abnormal   Collection Time   02/28/12  4:33 AM      Component Value Range Comment   WBC 6.9  4.0 - 10.5 K/uL    RBC 5.77  4.22 - 5.81 MIL/uL    Hemoglobin 18.8 (*) 13.0 - 17.0 g/dL    HCT 54.4 (*) 39.0 - 52.0 %    MCV 94.3  78.0 - 100.0 fL    MCH 32.6  26.0 - 34.0 pg    MCHC 34.6  30.0 - 36.0 g/dL    RDW 14.3  11.5 - 15.5 %    Platelets 182  150 - 400 K/uL    Neutrophils Relative 59  43 - 77 %    Lymphocytes Relative 16  12 - 46 %    Monocytes Relative 25 (*) 3 - 12 %    Eosinophils Relative 0  0 - 5 %    Basophils Relative 0  0 - 1 %    Band Neutrophils 0  0 - 10 %    Metamyelocytes Relative 0      Myelocytes 0      Promyelocytes Absolute 0      Blasts 0      nRBC 0  0 /100 WBC    Neutro Abs 4.1  1.7 - 7.7 K/uL    Lymphs Abs 1.1  0.7 - 4.0 K/uL    Monocytes Absolute 1.7 (*) 0.1 - 1.0 K/uL    Eosinophils Absolute 0.0  0.0 - 0.7 K/uL    Basophils Absolute 0.0  0.0 - 0.1 K/uL    WBC Morphology ATYPICAL LYMPHOCYTES   INCREASED BANDS (>20% BANDS)  HEPATIC FUNCTION PANEL     Status: Abnormal   Collection Time   02/28/12  4:33 AM      Component Value Range Comment   Total Protein 4.8 (*) 6.0 - 8.3 g/dL    Albumin 1.7 (*) 3.5 - 5.2 g/dL    AST 23  0 - 37 U/L    ALT 13  0 - 53 U/L    Alkaline Phosphatase 38 (*) 39 - 117 U/L    Total Bilirubin 0.7  0.3 - 1.2 mg/dL    Bilirubin, Direct 0.4 (*) 0.0 - 0.3 mg/dL    Indirect Bilirubin 0.3  0.3 - 0.9 mg/dL   LACTIC ACID,  PLASMA     Status: Abnormal   Collection Time   02/28/12  6:44 AM      Component Value Range Comment   Lactic Acid, Venous 5.6 (*) 0.5 - 2.2 mmol/L   URINALYSIS, ROUTINE W REFLEX MICROSCOPIC     Status: Abnormal   Collection Time   02/28/12 10:03 AM      Component Value Range Comment   Color, Urine YELLOW   YELLOW    APPearance CLEAR  CLEAR    Specific Gravity, Urine >1.030 (*) 1.005 - 1.030    pH 6.5  5.0 - 8.0    Glucose, UA 100 (*) NEGATIVE mg/dL    Hgb urine dipstick LARGE (*) NEGATIVE    Bilirubin Urine LARGE (*) NEGATIVE    Ketones, ur 15 (*) NEGATIVE mg/dL    Protein, ur 100 (*) NEGATIVE mg/dL    Urobilinogen, UA 2.0 (*) 0.0 - 1.0 mg/dL    Nitrite POSITIVE (*) NEGATIVE    Leukocytes, UA TRACE (*) NEGATIVE   URINE MICROSCOPIC-ADD ON     Status: Abnormal   Collection Time   02/28/12 10:03 AM      Component Value Range Comment   WBC, UA 0-2  <3 WBC/hpf    RBC / HPF TOO NUMEROUS TO COUNT  <3 RBC/hpf    Bacteria, UA MANY (*) RARE    Casts GRANULAR CAST (*) NEGATIVE   BASIC METABOLIC PANEL     Status: Abnormal   Collection Time   02/28/12 11:43 AM      Component Value Range Comment   Sodium 133 (*) 135 - 145 mEq/L    Potassium 5.9 (*) 3.5 - 5.1 mEq/L    Chloride 108  96 - 112 mEq/L    CO2 14 (*) 19 - 32 mEq/L    Glucose, Bld 136 (*) 70 - 99 mg/dL    BUN 38 (*) 6 - 23 mg/dL    Creatinine, Ser 2.12 (*) 0.50 - 1.35 mg/dL    Calcium 7.2 (*) 8.4 - 10.5 mg/dL    GFR calc non Af Amer 34 (*) >90 mL/min    GFR calc Af Amer 39 (*) >90 mL/min   BLOOD GAS, ARTERIAL     Status: Abnormal   Collection Time   02/28/12 12:43 PM      Component Value Range Comment   O2 Content 2.0      pH, Arterial 7.257 (*) 7.350 - 7.450    pCO2 arterial 26.3 (*) 35.0 - 45.0 mmHg    pO2, Arterial 82.8  80.0 - 100.0 mmHg    Bicarbonate 11.3 (*) 20.0 - 24.0 mEq/L    TCO2 9.7  0 - 100 mmol/L    Acid-base deficit 14.4 (*) 0.0 - 2.0 mmol/L    O2 Saturation 96.0      Patient temperature 37.0      Collection site REVIEWED BY      Drawn by COLLECTED BY RT      Sample type ARTERIAL      Allens test (pass/fail) PASS  PASS   LACTIC ACID, PLASMA     Status: Abnormal   Collection Time   02/28/12 12:44 PM      Component Value Range Comment   Lactic Acid, Venous 6.7 (*) 0.5 - 2.2 mmol/L   TYPE AND SCREEN     Status:  Normal   Collection Time   02/28/12  1:39 PM      Component Value Range Comment   ABO/RH(D) O NEG  Antibody Screen NEG      Sample Expiration 03/02/2012     POTASSIUM     Status: Normal   Collection Time   02/28/12  1:42 PM      Component Value Range Comment   Potassium 5.0  3.5 - 5.1 mEq/L     Dg Chest Port 1 View  02/28/2012  *RADIOLOGY REPORT*  Clinical Data: Confirm line placement  PORTABLE CHEST - 1 VIEW  Comparison: Plain film 02/27/2012  Findings: There is an NG tube with tip at the gastric GE junction. Interval placement of a right PICC line with tip in the distal SVC. Stable cardiac silhouette.  There are low lung volumes and basilar atelectasis.  No pneumothorax.  IMPRESSION:  1.  NG tube with tip at the GE junction. 2.  New right PICC line with tip in distal SVC. 3.  Bibasilar atelectasis and low lung volumes.   Original Report Authenticated By: Suzy Bouchard, M.D.    Dg Chest Port 1 View  02/27/2012  *RADIOLOGY REPORT*  Clinical Data: Fever with body aches.  PORTABLE CHEST - 1 VIEW  Comparison: None.  Findings: 0517 hours.  Left lung is clear.  There is a probable subsegmental atelectasis at the right base. Cardiopericardial silhouette is at upper limits of normal for size. Telemetry leads overlie the chest.  IMPRESSION: Low volume film with probable right basilar atelectasis.  There is some fullness in the infrahilar regions bilaterally.  Dedicated PA and lateral follow-up film recommended after resolution of acute symptoms.   Original Report Authenticated By: Misty Stanley, M.D.    Dg Abd 2 Views  02/27/2012  *RADIOLOGY REPORT*  Clinical Data: Abdominal pain.  Evaluate for obstruction.  History of ulcerative colitis.  ABDOMEN - 2 VIEW  Comparison: CT scan from 02/26/2012  Findings: Upright film shows no evidence for intraperitoneal free air.  Upright film shows multiple colonic large air-fluid levels. Supine film shows diffuse gaseous colonic distention in the transverse and left  colon.  Contrast material in the urinary bladder is compatible with the CT scan yesterday.  No gaseous Small bowel dilatation.  IMPRESSION: Gaseous distention of colon noted down to the level of the rectum. Given the history of ulcerative colitis and rectosigmoid anastomoses, anastomotic stricture would be a consideration.  I personally discussed the results of this study with Dr. Conley Canal at to 0910 hours on 02/27/2012.   Original Report Authenticated By: Misty Stanley, M.D.     Review of Systems  Unable to perform ROS: intubated   Blood pressure 102/67, pulse 37, temperature 97.9 F (36.6 C), temperature source Oral, resp. rate 38, height 5' 9"  (1.753 m), weight 99.9 kg (220 lb 3.8 oz), SpO2 96.00%. Physical Exam  Neck: No JVD present.  Cardiovascular: Normal rate and regular rhythm.   No murmur heard. Respiratory: He has wheezes.  GI: He exhibits distension.    Assessment/Plan: Problem #1 acute kidney injury as was seems to be multifactorial. Initially when he was admitted his creatinine was within normal range then received CT scan was controlled on February 2. The next morning his creatinine has increased from his baseline to about 1.83 and the increased to above 2 this morning. This seems to be consistent with dye-induced acute kidney injury. Since patient has fever and hypotension ATN also maybe a contributing factor. Presently patient is none oliguric. Problem #2 hyperkalemia potassium has come down to 5. Problem #3 metabolic acidosis most likely anion gap, secondary to lactic acidosis. Problem #4 C. difficile colitis Problem #  5 severe hypoalbuminemia most likely from poor nutrition last albumin was 1.7. Problem #6 ulcerative colitis. Plan: Agree with hydration and antibiotics. We'll try to do some albumin and once his blood pressure improves his diuretics. We'll follow his basic metabolic panel in the morning. He is hyperkalemia becomes an issue most likely patient may need to under  go hemodialysis.  Marlyce Mcdougald S 02/28/2012, 6:19 PM

## 2012-02-28 NOTE — Progress Notes (Signed)
E-link md updated on ABG results & calcium of 5.7. Orders noted

## 2012-02-28 NOTE — Progress Notes (Signed)
Patient reevaluated about an hour ago along with Dr. Storm Frisk. Patient appears to be deteriorating. His urine output has dropped. His abdomen has become more distended and tense. Patient also evaluated by Dr. Geroge Baseman and colectomy recommended in order to stop his fulminant process. Patient is reluctant to have ileostomy this does not have the permanent. He can have ileal pouch at a later date electively. Patient also discussed with patient's wife and the daughter at length. Patient is agreeable to proceed with colectomy.

## 2012-02-28 NOTE — Progress Notes (Signed)
1 Day Post-Op  Subjective: Increased abdominal distention midmorning. Some increased discomfort and pain. Mostly colicky in nature. Some confusion.    Objective: Vital signs in last 24 hours: Temp:  [97.3 F (36.3 C)-100.8 F (38.2 C)] 99.8 F (37.7 C) (02/04 0400) Pulse Rate:  [27-169] 31  (02/04 1000) Resp:  [18-41] 28  (02/04 1000) BP: (84-158)/(51-133) 84/63 mmHg (02/04 1000) SpO2:  [82 %-100 %] 82 % (02/04 1000) FiO2 (%):  [100 %] 100 % (02/03 2316) Weight:  [99.2 kg (218 lb 11.1 oz)-99.9 kg (220 lb 3.8 oz)] 99.9 kg (220 lb 3.8 oz) (02/04 0500) Last BM Date: 02/24/12  Intake/Output from previous day: 02/03 0701 - 02/04 0700 In: 8125.5 [P.O.:240; I.V.:7585.5; IV Piggyback:300] Out: 3225 [Urine:775; Emesis/NG output:300; Blood:150] Intake/Output this shift:    General appearance: alert, slowed mentation, toxic and uncomfortable appearing. Resp: tachypnic Cardio: tachycardic, regular rhythm GI: Quiet, soft, distended.  tender.  No peritoneal signs.  cecostomy function.    Lab Results:   Basename 02/28/12 0433 02/27/12 0500  WBC 6.9 10.2  HGB 18.8* 17.1*  HCT 54.4* 47.9  PLT 182 165   BMET  Basename 02/28/12 1143 02/28/12 0433  NA 133* 135  K 5.9* 5.0  CL 108 108  CO2 14* 15*  GLUCOSE 136* 112*  BUN 38* 31*  CREATININE 2.12* 1.83*  CALCIUM 7.2* 7.5*   PT/INR No results found for this basename: LABPROT:2,INR:2 in the last 72 hours ABG  Basename 02/28/12 1243  PHART 7.257*  HCO3 11.3*    Studies/Results: Ct Abdomen Pelvis W Contrast  02/26/2012  *RADIOLOGY REPORT*  Clinical Data: Diarrhea, body aches, prior colon surgery with colostomy for perforation  CT ABDOMEN AND PELVIS WITH CONTRAST  Technique:  Multidetector CT imaging of the abdomen and pelvis was performed following the standard protocol during bolus administration of intravenous contrast.  Contrast: 100 ml Omnipaque-300 IV  Comparison: 11/06/2010  Findings: Lung bases are essentially clear.  Liver,  spleen, pancreas, and adrenal glands are within normal limits.  Gallbladder is unremarkable.  No intrahepatic or extrahepatic ductal dilatation.  Three left renal cysts measuring up to 1.7 cm.  Right kidney is within normal limits.  No hydronephrosis.  No evidence of bowel obstruction.  Normal appendix.  Prior distal colonic resection with reanastomosis in the pelvis (series 2/image 74).  Moderate stool in the colon.  Atherosclerotic calcifications of the abdominal aorta and branch vessels.  No abdominopelvic ascites.  No suspicious abdominopelvic lymphadenopathy.  Small fat-containing left abdominal hernia at the site of prior colostomy (series 2/image 7).  Prostate is unremarkable.  Bladder is within normal limits.  Mild degenerative changes of the visualized thoracolumbar spine.  IMPRESSION: No evidence of bowel obstruction.  Normal appendix.  Prior distal colonic resection with reanastomosis.  Moderate stool throughout the colon, suggesting constipation.  Otherwise, no CT findings to account for the patient's abdominal pain.   Original Report Authenticated By: Julian Hy, M.D.    Dg Chest Port 1 View  02/28/2012  *RADIOLOGY REPORT*  Clinical Data: Confirm line placement  PORTABLE CHEST - 1 VIEW  Comparison: Plain film 02/27/2012  Findings: There is an NG tube with tip at the gastric GE junction. Interval placement of a right PICC line with tip in the distal SVC. Stable cardiac silhouette.  There are low lung volumes and basilar atelectasis.  No pneumothorax.  IMPRESSION:  1.  NG tube with tip at the GE junction. 2.  New right PICC line with tip in distal SVC. 3.  Bibasilar atelectasis and low lung volumes.   Original Report Authenticated By: Suzy Bouchard, M.D.    Dg Chest Port 1 View  02/27/2012  *RADIOLOGY REPORT*  Clinical Data: Fever with body aches.  PORTABLE CHEST - 1 VIEW  Comparison: None.  Findings: 0517 hours.  Left lung is clear.  There is a probable subsegmental atelectasis at the right  base. Cardiopericardial silhouette is at upper limits of normal for size. Telemetry leads overlie the chest.  IMPRESSION: Low volume film with probable right basilar atelectasis.  There is some fullness in the infrahilar regions bilaterally.  Dedicated PA and lateral follow-up film recommended after resolution of acute symptoms.   Original Report Authenticated By: Misty Stanley, M.D.    Dg Abd 2 Views  02/27/2012  *RADIOLOGY REPORT*  Clinical Data: Abdominal pain.  Evaluate for obstruction.  History of ulcerative colitis.  ABDOMEN - 2 VIEW  Comparison: CT scan from 02/26/2012  Findings: Upright film shows no evidence for intraperitoneal free air.  Upright film shows multiple colonic large air-fluid levels. Supine film shows diffuse gaseous colonic distention in the transverse and left colon.  Contrast material in the urinary bladder is compatible with the CT scan yesterday.  No gaseous small bowel dilatation.  IMPRESSION: Gaseous distention of colon noted down to the level of the rectum. Given the history of ulcerative colitis and rectosigmoid anastomoses, anastomotic stricture would be a consideration.  I personally discussed the results of this study with Dr. Conley Canal at to 0910 hours on 02/27/2012.   Original Report Authenticated By: Misty Stanley, M.D.     Anti-infectives: Anti-infectives     Start     Dose/Rate Route Frequency Ordered Stop   02/28/12 1200   vancomycin (VANCOCIN) 50 mg/mL oral solution 500 mg     Comments: First dose ASAP      500 mg Per Tube 4 times per day 02/28/12 0814     02/28/12 1000   vancomycin (VANCOCIN) IVPB 1000 mg/200 mL premix        1,000 mg 200 mL/hr over 60 Minutes Intravenous Every 12 hours 02/28/12 0845     02/28/12 1000   vancomycin (VANCOCIN) 500 mg in sodium chloride irrigation 0.9 % 100 mL ENEMA        500 mg Rectal 2 times daily 02/28/12 0923     02/27/12 1600   metroNIDAZOLE (FLAGYL) IVPB 500 mg  Status:  Discontinued        500 mg 100 mL/hr over 60  Minutes Intravenous Every 8 hours 02/27/12 1546 02/27/12 2000   02/27/12 1600   ciprofloxacin (CIPRO) IVPB 400 mg  Status:  Discontinued        400 mg 200 mL/hr over 60 Minutes Intravenous Every 12 hours 02/27/12 1547 02/27/12 2000   02/27/12 1545   metroNIDAZOLE (FLAGYL) IVPB 500 mg  Status:  Discontinued        500 mg 100 mL/hr over 60 Minutes Intravenous  Once 02/27/12 1535 02/28/12 0843   02/27/12 1545   vancomycin (VANCOCIN) 500 mg in sodium chloride 0.9 % 100 mL IVPB  Status:  Discontinued        500 mg 100 mL/hr over 60 Minutes Intravenous  Once 02/27/12 1535 02/27/12 1538   02/27/12 1545   ciprofloxacin (CIPRO) IVPB 400 mg  Status:  Discontinued        400 mg 200 mL/hr over 60 Minutes Intravenous  Once 02/27/12 1538 02/28/12 0843   02/27/12 1415   vancomycin (VANCOCIN) 500 mg in sodium  chloride 0.9 % 100 mL IVPB        500 mg 100 mL/hr over 60 Minutes Intravenous  Once 02/27/12 1402 02/27/12 1411   02/27/12 1400   vancomycin (VANCOCIN) powder 500 mg  Status:  Discontinued        500 mg Other To Surgery 02/27/12 1359 02/27/12 1402   02/27/12 1200   metroNIDAZOLE (FLAGYL) IVPB 500 mg     Comments: First dose ASAP      500 mg 100 mL/hr over 60 Minutes Intravenous Every 6 hours 02/27/12 0953     02/27/12 1200   vancomycin (VANCOCIN) 50 mg/mL oral solution 250 mg  Status:  Discontinued     Comments: First dose ASAP      250 mg Oral 4 times per day 02/27/12 0953 02/28/12 0814   02/26/12 1700   oseltamivir (TAMIFLU) capsule 75 mg        75 mg Oral  Once 02/26/12 1651 02/26/12 1715          Assessment/Plan: s/p Procedure(s) (LRB) with comments: EXPLORATORY LAPAROTOMY (N/A) CECOSTOMY (N/A) - Cecostomy Tube Placement Patient clinically worsening. did show initial response to the cecostomy tube placement however is now tachycardic again with worsening renal function. His abdominal distention and has increased rushing of the cecostomy tube demonstrated patency with return of  liquid stool.Connor Small  at this point concern patient is failing to respond to medical treatment for his Clostridium difficile infection and he'll patient's clinical course requires return to the operating room for a total colectomy. This is been discussed with the patient and family. We'll proceed.  LOS: 2 days    Chardonay Scritchfield C 02/28/2012

## 2012-02-28 NOTE — Progress Notes (Signed)
Subjective; Patient states he is having much less abdominal pain yesterday. He denies chest pain or shortness of breath. Objective; BP 97/61  Pulse 150  Temp 99.8 F (37.7 C) (Axillary)  Resp 22  Ht 5' 9"  (1.753 m)  Wt 220 lb 3.8 oz (99.9 kg)  BMI 32.52 kg/m2  SpO2 94% Patient is sleepy but responds appropriately to questions. Cardiac exam with regular rhythm normal S1 and S2. No murmur or gallop noted. Lungs are clear to auscultation. Abdomen is less distended than yesterday. Abdomen is tense. Bowel sounds are absent. Scant amount of liquid stool noted in 6 ostomy tube. NG tube output 300 mL last 8 hours. Urine output 775 ML last 16 hours. Lab data; WBC 6.9, H&H 18.8 and 54.4, platelet count 182K Differential significant for greater than 20% bands and 25% monocytes. Serum sodium 135, potassium 5.0, chloride 108, CO2 15, glucose 112, BUN 31, creatinine 1.83 and calcium 7.5. Last month lactic acid level is 5.6; was 3.8 yesterday. Assessment; C. 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 Status post cecostomy tube placement by Dr. Geroge Baseman yesterday. Abdomen is much less distended. Patient remains critically ill. He may still be behind in fluids and is also third spacing. Non-oliguric acute renal failure do to combination of prerenal azotemia and toxemia. Lactic acidosis secondary to impaired tissue oxygenation primarily of large bowel. Recommendations. Increase vancomycin dose to 500 mg every 6 via NG tube. Continue IV metronidazole. Give another bolus of 500 mL of normal saline. Will ask lab to do LFTs and today's blood.

## 2012-02-28 NOTE — Transfer of Care (Signed)
Immediate Anesthesia Transfer of Care Note  Patient: Connor Small  Procedure(s) Performed: Procedure(s) (LRB) with comments: TOTAL COLECTOMY (N/A) ILEOSTOMY (N/A)  Patient Location: PACU and ICU  Anesthesia Type:General  Level of Consciousness: sedated  Airway & Oxygen Therapy: Patient remains intubated per anesthesia plan and Patient placed on Ventilator (see vital sign flow sheet for setting)  Post-op Assessment: Report given to PACU RN  Post vital signs: Reviewed and stable  Complications: No apparent anesthesia complications

## 2012-02-28 NOTE — Consult Note (Signed)
Consult requested by: Dr. Conley Canal Consult requested for respiratory failure:  HPI: This is a 52 year old who has a ten-year history of ulcerative colitis. He follows with his gastroenterologist and was seen in December and seemed to be doing well. He started having problems on February 1 and had and sore throat he was unable to eat or drink at temperature to 102 and came to the emergency room where he was noted to have a markedly dilated colon and admitted. He does have a history of antibiotic exposure. He had surgery last night because his colon was unable to be decompressed with colonoscopy. He has also been noted to be positive for C. difficile colitis. He had further problem this morning and had to go back to the operating room and is now on a ventilator and hypotensive. He received 8 L of fluid in the OR. He has a PICC line but does not have a central line. He was having CVP is measured from the PICC line was earlier about 2 or so. He is not on full-blown sepsis protocol but is being managed as if he is septic with large volumes of IV fluids. He is on Cipro and Flagyl protonix and vancomycin. He has been receiving insulin. He is not known to have any lung trouble  Past Medical History  Diagnosis Date  . Chronic diarrhea   . Rectal bleed   . Hemorrhoids   . Ulcerative colitis     Distal UC over 8 yrs ago diagnosed  . Diverticulitis of large intestine with perforation 10/2011    done at [facility] History  Problem Relation Age of Onset  . Cancer Sister   . Healthy Daughter   . Hypertension Mother      History   Social History  . Marital Status: Married    Spouse Name: N/A    Number of Children: N/A  . Years of Education: N/A   Social History Main Topics  . Smoking status: Former Smoker    Types: Cigarettes    Quit date: 07/17/1984  . Smokeless tobacco: Never Used     Comment: Quit 23 yrs ago  . Alcohol Use: No  . Drug Use: No  . Sexually Active: Yes   Other  Topics Concern  . None   Social History Narrative  . None     ROS: Not obtainable    Objective: Vital signs in last 24 hours: Temp:  [97.3 F (36.3 C)-100.8 F (38.2 C)] 97.9 F (36.6 C) (02/04 1407) Pulse Rate:  [27-158] 37  (02/04 1325) Resp:  [18-40] 38  (02/04 1420) BP: (84-158)/(51-94) 102/67 mmHg (02/04 1420) SpO2:  [81 %-100 %] 96 % (02/04 1410) FiO2 (%):  [100 %] 100 % (02/03 2316) Weight:  [99.2 kg (218 lb 11.1 oz)-99.9 kg (220 lb 3.8 oz)] 99.9 kg (220 lb 3.8 oz) (02/04 0500) Weight change: 8.481 kg (18 lb 11.1 oz) Last BM Date: 02/24/12  Intake/Output from previous day: 02/03 0701 - 02/04 0700 In: 8125.5 [P.O.:240; I.V.:7585.5; IV Piggyback:300] Out: 3225 [Urine:775; Emesis/NG output:300; Blood:150]  PHYSICAL EXAM He is intubated and sedated. His heart rate is about 125 his blood pressure in the 34L systolic. His pupils do react. His mucous membranes are moist. His neck is supple. His chest shows rhonchi bilaterally. His heart is regular with a tachycardia. I did not examine his abdomen since she's just had surgery. He has trace edema of the extremities. Central nervous system examination has been grossly  intact but cannot be assessed right now. He is intubated and on the ventilator and on sedation.  Lab Results: Basic Metabolic Panel:  Basename 02/28/12 1342 02/28/12 1143 02/28/12 0433  NA -- 133* 135  K 5.0 5.9* --  CL -- 108 108  CO2 -- 14* 15*  GLUCOSE -- 136* 112*  BUN -- 38* 31*  CREATININE -- 2.12* 1.83*  CALCIUM -- 7.2* 7.5*  MG -- -- --  PHOS -- -- --   Liver Function Tests:  Basename 02/28/12 0433 02/27/12 0500  AST 23 14  ALT 13 13  ALKPHOS 38* 61  BILITOT 0.7 0.9  PROT 4.8* 5.9*  ALBUMIN 1.7* 2.6*    Basename 02/26/12 1409  LIPASE 9*  AMYLASE --   No results found for this basename: AMMONIA:2 in the last 72 hours CBC:  Basename 02/28/12 0433 02/27/12 0500  WBC 6.9 10.2  NEUTROABS 4.1 8.2*  HGB 18.8* 17.1*  HCT 54.4* 47.9   MCV 94.3 92.8  PLT 182 165   Cardiac Enzymes:  Basename 02/26/12 1409  CKTOTAL --  CKMB --  CKMBINDEX --  TROPONINI <0.30   BNP: No results found for this basename: PROBNP:3 in the last 72 hours D-Dimer: No results found for this basename: DDIMER:2 in the last 72 hours CBG: No results found for this basename: GLUCAP:6 in the last 72 hours Hemoglobin A1C: No results found for this basename: HGBA1C in the last 72 hours Fasting Lipid Panel: No results found for this basename: CHOL,HDL,LDLCALC,TRIG,CHOLHDL,LDLDIRECT in the last 72 hours Thyroid Function Tests: No results found for this basename: TSH,T4TOTAL,FREET4,T3FREE,THYROIDAB in the last 72 hours Anemia Panel: No results found for this basename: VITAMINB12,FOLATE,FERRITIN,TIBC,IRON,RETICCTPCT in the last 72 hours Coagulation: No results found for this basename: LABPROT:2,INR:2 in the last 72 hours Urine Drug Screen: Drugs of Abuse  No results found for this basename: labopia, cocainscrnur, labbenz, amphetmu, thcu, labbarb    Alcohol Level: No results found for this basename: ETH:2 in the last 72 hours Urinalysis:  Basename 02/28/12 1003 02/26/12 1627  COLORURINE YELLOW YELLOW  LABSPEC >1.030* 1.020  PHURINE 6.5 5.5  GLUCOSEU 100* NEGATIVE  HGBUR LARGE* LARGE*  BILIRUBINUR LARGE* SMALL*  KETONESUR 15* TRACE*  PROTEINUR 100* 100*  UROBILINOGEN 2.0* 0.2  NITRITE POSITIVE* NEGATIVE  LEUKOCYTESUR TRACE* NEGATIVE   Misc. Labs:   ABGS:  Basename 02/28/12 1243  PHART 7.257*  PO2ART 82.8  TCO2 9.7  HCO3 11.3*     MICROBIOLOGY: Recent Results (from the past 240 hour(s))  CULTURE, BLOOD (ROUTINE X 2)     Status: Normal (Preliminary result)   Collection Time   02/27/12  4:58 AM      Component Value Range Status Comment   Specimen Description BLOOD RIGHT ARM   Final    Special Requests BOTTLES DRAWN AEROBIC AND ANAEROBIC 6CC   Final    Culture NO GROWTH 1 DAY   Final    Report Status PENDING   Incomplete    CULTURE, BLOOD (ROUTINE X 2)     Status: Normal (Preliminary result)   Collection Time   02/27/12  5:15 AM      Component Value Range Status Comment   Specimen Description BLOOD RIGHT HAND   Final    Special Requests BOTTLES DRAWN AEROBIC AND ANAEROBIC 6CC   Final    Culture NO GROWTH 1 DAY   Final    Report Status PENDING   Incomplete   SURGICAL PCR SCREEN     Status: Normal   Collection  Time   02/27/12  3:15 PM      Component Value Range Status Comment   MRSA, PCR NEGATIVE  NEGATIVE Final    Staphylococcus aureus NEGATIVE  NEGATIVE Final     Studies/Results: Dg Chest Port 1 View  02/28/2012  *RADIOLOGY REPORT*  Clinical Data: Confirm line placement  PORTABLE CHEST - 1 VIEW  Comparison: Plain film 02/27/2012  Findings: There is an NG tube with tip at the gastric GE junction. Interval placement of a right PICC line with tip in the distal SVC. Stable cardiac silhouette.  There are low lung volumes and basilar atelectasis.  No pneumothorax.  IMPRESSION:  1.  NG tube with tip at the GE junction. 2.  New right PICC line with tip in distal SVC. 3.  Bibasilar atelectasis and low lung volumes.   Original Report Authenticated By: Suzy Bouchard, M.D.    Dg Chest Port 1 View  02/27/2012  *RADIOLOGY REPORT*  Clinical Data: Fever with body aches.  PORTABLE CHEST - 1 VIEW  Comparison: None.  Findings: 0517 hours.  Left lung is clear.  There is a probable subsegmental atelectasis at the right base. Cardiopericardial silhouette is at upper limits of normal for size. Telemetry leads overlie the chest.  IMPRESSION: Low volume film with probable right basilar atelectasis.  There is some fullness in the infrahilar regions bilaterally.  Dedicated PA and lateral follow-up film recommended after resolution of acute symptoms.   Original Report Authenticated By: Misty Stanley, M.D.    Dg Abd 2 Views  02/27/2012  *RADIOLOGY REPORT*  Clinical Data: Abdominal pain.  Evaluate for obstruction.  History of ulcerative colitis.   ABDOMEN - 2 VIEW  Comparison: CT scan from 02/26/2012  Findings: Upright film shows no evidence for intraperitoneal free air.  Upright film shows multiple colonic large air-fluid levels. Supine film shows diffuse gaseous colonic distention in the transverse and left colon.  Contrast material in the urinary bladder is compatible with the CT scan yesterday.  No gaseous small bowel dilatation.  IMPRESSION: Gaseous distention of colon noted down to the level of the rectum. Given the history of ulcerative colitis and rectosigmoid anastomoses, anastomotic stricture would be a consideration.  I personally discussed the results of this study with Dr. Conley Canal at to 0910 hours on 02/27/2012.   Original Report Authenticated By: Misty Stanley, M.D.     Medications:  Scheduled:   . dextrose      . metronidazole  500 mg Intravenous Q6H  . pantoprazole (PROTONIX) IV  40 mg Intravenous Q24H  . sodium chloride  10-40 mL Intracatheter Q12H  . sodium chloride  3 mL Intravenous Q12H  . vancomycin  1,000 mg Intravenous Q12H   Continuous:   . sodium chloride     TUU:EKCMKLKJ, HYDROmorphone (DILAUDID) injection, midazolam, ondansetron (ZOFRAN) IV, ondansetron (ZOFRAN) IV, ondansetron, sodium chloride  Assesment: He has had 2 surgeries for what seems to be complications of fulminant C. difficile colitis. He has acute respiratory failure following the second surgery. He has systemic inflammatory response suggesting sepsis. He has acute renal failure. He is still hypotensive. Active Problems:  UC (ulcerative colitis)  Dehydration  C. difficile colitis  Colonic stricture  S/P cecostomy  ARF (acute renal failure)  Sinus tachycardia  Hematuria    Plan: Continue triple antibiotic therapy. He is being managed as if he is septic and he does have a lactic acid of 6.7. I will initiate standard ventilator protocol. He has not had a blood gasfor chest x-ray since  she's come back from surgery.    LOS: 2 days    Lyza Houseworth L 02/28/2012, 6:12 PM

## 2012-02-28 NOTE — Progress Notes (Signed)
Called MD about increased HR in the high 150s around 0000- lopressor 2.5IV ordered and given; HR went down to 130s then back up to high 140s; called MD back and tylenol order received and given; told to call MD back if HR increases to 150s and sustains; HR sustained into 150s around 0200. Paged MD again and order of 518m bolus received and given at this time. Will continue to monitor patient.

## 2012-02-28 NOTE — Procedures (Signed)
Intubation Procedure Note ROLLIE HYNEK [id number] [birth year]  Procedure: Intubation Indications: Airway protection and maintenance  Procedure Details Consent: Risks of procedure as well as the alternatives and risks of each were explained to the (patient/caregiver).  Consent for procedure obtained. Time Out: Verified patient identification, verified procedure, site/side was marked, verified correct patient position, special equipment/implants available, medications/allergies/relevent history reviewed, required imaging and test results available.  Performed  Maximum sterile technique was used including gloves.  MAC    Evaluation Hemodynamic Status: BP stable throughout; O2 sats: stable throughout and transiently fell during during procedure Patient's Current Condition: stable Complications: No apparent complications Patient did tolerate procedure well. Chest X-ray ordered to verify placement.  CXR: pending.   Elsie Stain 02/28/2012

## 2012-02-28 NOTE — Progress Notes (Addendum)
eLink Physician-Brief Progress Note Patient Name: Connor Small DOB: March 19, 1960 MRN: [id number]  Date of Service  02/28/2012   HPI/Events of Note   Pt is post ex lap for colitis Start septic shock protocol Acidosis noted  eICU Interventions  Septic shock protocol Start bicarb drip   Intervention Category Major Interventions: Shock - evaluation and management;Sepsis - evaluation and management  Asencion Noble 02/28/2012, 6:51 PM

## 2012-02-28 NOTE — Addendum Note (Signed)
Addendum  created 02/28/12 1049 by Charmaine Downs, CRNA   Modules edited:Notes Section

## 2012-02-28 NOTE — Anesthesia Postprocedure Evaluation (Signed)
  Anesthesia Post-op Note  Patient: Connor Small  Procedure(s) Performed: Procedure(s) (LRB) with comments: EXPLORATORY LAPAROTOMY (N/A) CECOSTOMY (N/A) - Cecostomy Tube Placement  Patient Location: ICU  Anesthesia Type:General  Level of Consciousness: awake, alert , oriented and patient cooperative  Airway and Oxygen Therapy: Patient Spontanous Breathing and Patient connected to face mask oxygen  Post-op Pain: 5 /10, moderate  Post-op Assessment: Post-op Vital signs reviewed, PATIENT'S CARDIOVASCULAR STATUS UNSTABLE, Respiratory Function Stable, Patent Airway and No signs of Nausea or vomiting  Post-op Vital Signs: Reviewed and stable  Complications: No apparent anesthesia complications

## 2012-02-29 ENCOUNTER — Inpatient Hospital Stay (HOSPITAL_COMMUNITY): Payer: BC Managed Care – PPO

## 2012-02-29 LAB — BLOOD GAS, ARTERIAL
Acid-base deficit: 5.9 mmol/L — ABNORMAL HIGH (ref 0.0–2.0)
Bicarbonate: 15.6 mEq/L — ABNORMAL LOW (ref 20.0–24.0)
FIO2: 0.7 %
MECHVT: 500 mL
MECHVT: 440 mL
O2 Content: 70 L/min
O2 Saturation: 96.8 %
PEEP: 5 cmH2O
Patient temperature: 37
Patient temperature: 37
RATE: 20 resp/min
TCO2: 18.4 mmol/L (ref 0–100)
pH, Arterial: 7.343 — ABNORMAL LOW (ref 7.350–7.450)

## 2012-02-29 LAB — CBC WITH DIFFERENTIAL/PLATELET
Lymphocytes Relative: 4 % — ABNORMAL LOW (ref 12–46)
Lymphs Abs: 0.6 10*3/uL — ABNORMAL LOW (ref 0.7–4.0)
MCV: 93.1 fL (ref 78.0–100.0)
Neutro Abs: 12.7 10*3/uL — ABNORMAL HIGH (ref 1.7–7.7)
Neutrophils Relative %: 89 % — ABNORMAL HIGH (ref 43–77)
Platelets: 113 10*3/uL — ABNORMAL LOW (ref 150–400)
RBC: 4.48 MIL/uL (ref 4.22–5.81)
WBC Morphology: INCREASED
WBC: 14.2 10*3/uL — ABNORMAL HIGH (ref 4.0–10.5)

## 2012-02-29 LAB — COMPREHENSIVE METABOLIC PANEL
AST: 41 U/L — ABNORMAL HIGH (ref 0–37)
Albumin: 1 g/dL — ABNORMAL LOW (ref 3.5–5.2)
Albumin: 1 g/dL — ABNORMAL LOW (ref 3.5–5.2)
Alkaline Phosphatase: 19 U/L — ABNORMAL LOW (ref 39–117)
Alkaline Phosphatase: 37 U/L — ABNORMAL LOW (ref 39–117)
BUN: 39 mg/dL — ABNORMAL HIGH (ref 6–23)
Chloride: 111 mEq/L (ref 96–112)
Potassium: 3.6 mEq/L (ref 3.5–5.1)
Potassium: 3.9 mEq/L (ref 3.5–5.1)
Sodium: 134 mEq/L — ABNORMAL LOW (ref 135–145)
Sodium: 136 mEq/L (ref 135–145)
Total Bilirubin: 0.3 mg/dL (ref 0.3–1.2)
Total Protein: 2.7 g/dL — ABNORMAL LOW (ref 6.0–8.3)

## 2012-02-29 LAB — URINE CULTURE
Colony Count: NO GROWTH
Culture: NO GROWTH

## 2012-02-29 LAB — GLUCOSE, CAPILLARY
Glucose-Capillary: 144 mg/dL — ABNORMAL HIGH (ref 70–99)
Glucose-Capillary: 166 mg/dL — ABNORMAL HIGH (ref 70–99)

## 2012-02-29 LAB — PHOSPHORUS: Phosphorus: 2.2 mg/dL — ABNORMAL LOW (ref 2.3–4.6)

## 2012-02-29 LAB — CARBOXYHEMOGLOBIN
O2 Saturation: 78.5 %
Total hemoglobin: 14.3 g/dL (ref 13.5–18.0)

## 2012-02-29 MED ORDER — ALBUMIN HUMAN 25 % IV SOLN
25.0000 g | Freq: Four times a day (QID) | INTRAVENOUS | Status: DC
  Administered 2012-02-29 – 2012-03-01 (×4): 25 g via INTRAVENOUS
  Filled 2012-02-29 (×10): qty 100

## 2012-02-29 MED ORDER — METOPROLOL TARTRATE 1 MG/ML IV SOLN
5.0000 mg | Freq: Four times a day (QID) | INTRAVENOUS | Status: DC
  Administered 2012-02-29 – 2012-03-01 (×4): 5 mg via INTRAVENOUS
  Filled 2012-02-29 (×5): qty 5

## 2012-02-29 MED ORDER — PHENYLEPHRINE HCL 10 MG/ML IJ SOLN
INTRAMUSCULAR | Status: AC
  Filled 2012-02-29: qty 4

## 2012-02-29 MED ORDER — TRACE MINERALS CR-CU-F-FE-I-MN-MO-SE-ZN IV SOLN
INTRAVENOUS | Status: AC
  Administered 2012-02-29: 18:00:00 via INTRAVENOUS
  Filled 2012-02-29: qty 2000

## 2012-02-29 MED ORDER — INSULIN ASPART 100 UNIT/ML ~~LOC~~ SOLN
0.0000 [IU] | SUBCUTANEOUS | Status: DC
Start: 2012-02-29 — End: 2012-03-23
  Administered 2012-02-29 (×3): 3 [IU] via SUBCUTANEOUS
  Administered 2012-03-01: 4 [IU] via SUBCUTANEOUS
  Administered 2012-03-01: 7 [IU] via SUBCUTANEOUS
  Administered 2012-03-01: 4 [IU] via SUBCUTANEOUS
  Administered 2012-03-01 (×2): 7 [IU] via SUBCUTANEOUS
  Administered 2012-03-02 (×3): 15 [IU] via SUBCUTANEOUS
  Administered 2012-03-02 (×2): 11 [IU] via SUBCUTANEOUS
  Administered 2012-03-02: 15 [IU] via SUBCUTANEOUS
  Administered 2012-03-03: 11 [IU] via SUBCUTANEOUS
  Administered 2012-03-03 (×2): 4 [IU] via SUBCUTANEOUS
  Administered 2012-03-03 (×2): 11 [IU] via SUBCUTANEOUS
  Administered 2012-03-03 – 2012-03-04 (×2): 7 [IU] via SUBCUTANEOUS
  Administered 2012-03-04 (×2): 4 [IU] via SUBCUTANEOUS
  Administered 2012-03-04 (×2): 7 [IU] via SUBCUTANEOUS
  Administered 2012-03-04: 4 [IU] via SUBCUTANEOUS
  Administered 2012-03-05 (×4): 7 [IU] via SUBCUTANEOUS
  Administered 2012-03-05: 4 [IU] via SUBCUTANEOUS
  Administered 2012-03-05 (×2): 7 [IU] via SUBCUTANEOUS
  Administered 2012-03-06: 11 [IU] via SUBCUTANEOUS
  Administered 2012-03-06 (×3): 7 [IU] via SUBCUTANEOUS
  Administered 2012-03-06: 11 [IU] via SUBCUTANEOUS
  Administered 2012-03-07: 4 [IU] via SUBCUTANEOUS
  Administered 2012-03-07: 7 [IU] via SUBCUTANEOUS
  Administered 2012-03-07 (×2): 4 [IU] via SUBCUTANEOUS
  Administered 2012-03-07: 7 [IU] via SUBCUTANEOUS
  Administered 2012-03-07 – 2012-03-08 (×2): 4 [IU] via SUBCUTANEOUS
  Administered 2012-03-08: 7 [IU] via SUBCUTANEOUS
  Administered 2012-03-08: 4 [IU] via SUBCUTANEOUS
  Administered 2012-03-08: 7 [IU] via SUBCUTANEOUS
  Administered 2012-03-08 (×2): 4 [IU] via SUBCUTANEOUS
  Administered 2012-03-09: 7 [IU] via SUBCUTANEOUS
  Administered 2012-03-09 – 2012-03-10 (×7): 4 [IU] via SUBCUTANEOUS
  Administered 2012-03-10: 7 [IU] via SUBCUTANEOUS
  Administered 2012-03-10 (×2): 4 [IU] via SUBCUTANEOUS
  Administered 2012-03-10 – 2012-03-11 (×2): 7 [IU] via SUBCUTANEOUS
  Administered 2012-03-11: 1 [IU] via SUBCUTANEOUS
  Administered 2012-03-11 (×3): 4 [IU] via SUBCUTANEOUS
  Administered 2012-03-11: 7 [IU] via SUBCUTANEOUS
  Administered 2012-03-12: 2 [IU] via SUBCUTANEOUS
  Administered 2012-03-12: 3 [IU] via SUBCUTANEOUS
  Administered 2012-03-12: 4 [IU] via SUBCUTANEOUS
  Administered 2012-03-12: 3 [IU] via SUBCUTANEOUS
  Administered 2012-03-12: 4 [IU] via SUBCUTANEOUS
  Administered 2012-03-12: 2 [IU] via SUBCUTANEOUS
  Administered 2012-03-13: 11 [IU] via SUBCUTANEOUS
  Administered 2012-03-13: 7 [IU] via SUBCUTANEOUS
  Administered 2012-03-13: 4 [IU] via SUBCUTANEOUS
  Administered 2012-03-13 (×3): 7 [IU] via SUBCUTANEOUS
  Administered 2012-03-14: 4 [IU] via SUBCUTANEOUS
  Administered 2012-03-14: 7 [IU] via SUBCUTANEOUS
  Administered 2012-03-14: 3 [IU] via SUBCUTANEOUS
  Administered 2012-03-14: 4 [IU] via SUBCUTANEOUS
  Administered 2012-03-14: 7 [IU] via SUBCUTANEOUS
  Administered 2012-03-15 – 2012-03-16 (×6): 3 [IU] via SUBCUTANEOUS
  Administered 2012-03-16: 11 [IU] via SUBCUTANEOUS
  Administered 2012-03-16: 3 [IU] via SUBCUTANEOUS
  Administered 2012-03-17 (×3): 4 [IU] via SUBCUTANEOUS
  Administered 2012-03-17: 3 [IU] via SUBCUTANEOUS
  Administered 2012-03-17 – 2012-03-18 (×4): 4 [IU] via SUBCUTANEOUS
  Administered 2012-03-18 (×3): 3 [IU] via SUBCUTANEOUS
  Administered 2012-03-18: 11 [IU] via SUBCUTANEOUS
  Administered 2012-03-19: 3 [IU] via SUBCUTANEOUS
  Administered 2012-03-19: 7 [IU] via SUBCUTANEOUS
  Administered 2012-03-19: 4 [IU] via SUBCUTANEOUS
  Administered 2012-03-19: 7 [IU] via SUBCUTANEOUS
  Administered 2012-03-19 – 2012-03-20 (×3): 4 [IU] via SUBCUTANEOUS
  Administered 2012-03-20: 11 [IU] via SUBCUTANEOUS
  Administered 2012-03-20 – 2012-03-21 (×2): 4 [IU] via SUBCUTANEOUS
  Administered 2012-03-21: 7 [IU] via SUBCUTANEOUS
  Administered 2012-03-21: 4 [IU] via SUBCUTANEOUS
  Administered 2012-03-21 – 2012-03-22 (×3): 3 [IU] via SUBCUTANEOUS
  Filled 2012-02-29: qty 0.2

## 2012-02-29 MED ORDER — LEVALBUTEROL HCL 0.63 MG/3ML IN NEBU
0.6300 mg | INHALATION_SOLUTION | RESPIRATORY_TRACT | Status: DC
  Administered 2012-02-29 (×2): 0.63 mg via RESPIRATORY_TRACT
  Filled 2012-02-29 (×3): qty 3

## 2012-02-29 MED ORDER — PHENYLEPHRINE HCL 10 MG/ML IJ SOLN
30.0000 ug/min | INTRAVENOUS | Status: DC
  Administered 2012-02-29: 110 ug/min via INTRAVENOUS
  Administered 2012-02-29: 130 ug/min via INTRAVENOUS
  Administered 2012-02-29: 200 ug/min via INTRAVENOUS
  Administered 2012-02-29: 110 ug/min via INTRAVENOUS
  Administered 2012-03-01: 175 ug/min via INTRAVENOUS
  Administered 2012-03-01: 200 ug/min via INTRAVENOUS
  Administered 2012-03-01 (×2): 175 ug/min via INTRAVENOUS
  Administered 2012-03-02: 80 ug/min via INTRAVENOUS
  Administered 2012-03-02: 100 ug/min via INTRAVENOUS
  Administered 2012-03-02: 20 ug/min via INTRAVENOUS
  Administered 2012-03-02: 175 ug/min via INTRAVENOUS
  Administered 2012-03-02: 40 ug/min via INTRAVENOUS
  Administered 2012-03-02: 60 ug/min via INTRAVENOUS
  Administered 2012-03-02: 140 ug/min via INTRAVENOUS
  Filled 2012-02-29 (×11): qty 4

## 2012-02-29 MED ORDER — LEVALBUTEROL HCL 0.63 MG/3ML IN NEBU
0.6300 mg | INHALATION_SOLUTION | Freq: Four times a day (QID) | RESPIRATORY_TRACT | Status: DC
  Administered 2012-02-29 – 2012-03-02 (×7): 0.63 mg via RESPIRATORY_TRACT
  Filled 2012-02-29 (×10): qty 3

## 2012-02-29 MED ORDER — MORPHINE SULFATE 4 MG/ML IJ SOLN
4.0000 mg | INTRAMUSCULAR | Status: DC | PRN
Start: 2012-02-29 — End: 2012-03-01
  Administered 2012-02-29 – 2012-03-01 (×7): 4 mg via INTRAVENOUS
  Filled 2012-02-29 (×7): qty 1

## 2012-02-29 MED ORDER — MIDAZOLAM HCL 10 MG/2ML IJ SOLN
INTRAMUSCULAR | Status: AC
  Administered 2012-02-29: 4 mg
  Filled 2012-02-29: qty 2

## 2012-02-29 MED ORDER — FAT EMULSION 20 % IV EMUL
250.0000 mL | INTRAVENOUS | Status: AC
  Administered 2012-02-29: 250 mL via INTRAVENOUS
  Filled 2012-02-29: qty 250

## 2012-02-29 MED ORDER — SODIUM BICARBONATE 8.4 % IV SOLN
INTRAVENOUS | Status: DC
  Administered 2012-02-29 (×2): via INTRAVENOUS
  Filled 2012-02-29 (×3): qty 150

## 2012-02-29 MED ORDER — SODIUM BICARBONATE 8.4 % IV SOLN
INTRAVENOUS | Status: AC
  Filled 2012-02-29: qty 150

## 2012-02-29 MED ORDER — IPRATROPIUM BROMIDE 0.02 % IN SOLN
0.5000 mg | Freq: Four times a day (QID) | RESPIRATORY_TRACT | Status: DC
  Administered 2012-02-29 – 2012-03-02 (×7): 0.5 mg via RESPIRATORY_TRACT
  Filled 2012-02-29 (×6): qty 2.5

## 2012-02-29 MED ORDER — SODIUM CHLORIDE 0.9 % IV BOLUS (SEPSIS)
500.0000 mL | Freq: Once | INTRAVENOUS | Status: AC
  Administered 2012-02-29: 500 mL via INTRAVENOUS

## 2012-02-29 NOTE — Procedures (Signed)
Arterial Catheter Insertion Procedure Note KAMORI BARBIER [id number] 1961-01-10  Procedure: Insertion of Arterial Catheter  Indications: Blood pressure monitoring  Procedure Details Consent: Risks of procedure as well as the alternatives and risks of each were explained to the (patient/caregiver).  Consent for procedure obtained. Time Out: Verified patient identification, verified procedure, site/side was marked, verified correct patient position, special equipment/implants available, medications/allergies/relevent history reviewed, required imaging and test results available.  Performed  Maximum sterile technique was used including antiseptics. Skin prep: Chlorhexidine; local anesthetic administered 20 gauge catheter was inserted into left radial artery using the Seldinger technique.  Evaluation Blood flow good; BP tracing good. Complications: No apparent complications.   Revonda Standard 02/29/2012

## 2012-02-29 NOTE — Anesthesia Postprocedure Evaluation (Signed)
  Anesthesia Post-op Note  Patient: Connor Small  Procedure(s) Performed: Procedure(s) (LRB) with comments: TOTAL COLECTOMY (N/A) ILEOSTOMY (N/A)  Patient Location: ICU 7  Anesthesia Type:General  Level of Consciousness: Sedated  Airway and Oxygen Therapy: Intubated on Ventilator  Post-op Pain: Sedated  Post-op Assessment: No apparent anesthesia complications ort Post-op Vital Signs Remains tachycardic with HR in 140's, on vasopressors for BP support  Complications: No apparent anesthesia complications

## 2012-02-29 NOTE — Progress Notes (Signed)
Patient ID: Joanna Hews, male   DOB: Feb 15, 1960, 52 y.o.   MRN: [id number] Hx  c-diff colitis with colonic obstruction, post colectomy. Renal functions are improvimg. Filed Vitals:   02/29/12 1345 02/29/12 1355 02/29/12 1400 02/29/12 1404  BP:      Pulse: 148 146 144 144  Temp:      TempSrc:      Resp: 32 30 27 28   Height:      Weight:      SpO2: 97% 97% 98% 98%    Plan: Agree with Cipro and Flagyl and Vanc. Will continue to monitor.

## 2012-02-29 NOTE — Progress Notes (Signed)
Connor Small  MRN: [id number]  DOB/AGE: 1960-03-08 52 y.o.  Primary Care Physician:No primary provider on file.  Admit date: 02/26/2012  Chief Complaint:  Chief Complaint  Patient presents with  . Diarrhea  . Generalized Body Aches  . Back Pain    S-Pt presented on  02/26/2012 with  Chief Complaint  Patient presents with  . Diarrhea  . Generalized Body Aches  . Back Pain  . Pt intubated, sedated.   Meds    . albumin human  25 g Intravenous Q6H  . antiseptic oral rinse  15 mL Mouth Rinse QID  . chlorhexidine  15 mL Mouth Rinse BID  . hydrocortisone sodium succinate  100 mg Intravenous Q6H  . ipratropium  0.5 mg Nebulization Q4H  . levalbuterol  0.63 mg Nebulization Q4H  . metronidazole  500 mg Intravenous Q6H  . pantoprazole (PROTONIX) IV  40 mg Intravenous Daily  . sodium chloride  10-40 mL Intracatheter Q12H  . sodium chloride  3 mL Intravenous Q12H  . vancomycin  1,000 mg Intravenous Q12H       Physical Exam: Vital signs in last 24 hours: Temp:  [97.6 F (36.4 C)-98.5 F (36.9 C)] 98 F (36.7 C) (02/05 0820) Pulse Rate:  [29-141] 139  (02/05 0915) Resp:  [9-40] 29  (02/05 0915) BP: (77-140)/(37-84) 101/72 mmHg (02/05 0915) SpO2:  [73 %-100 %] 99 % (02/05 0915) FiO2 (%):  [60 %-100 %] 79.8 % (02/05 0915) Weight:  [236 lb 8.9 oz (107.3 kg)] 236 lb 8.9 oz (107.3 kg) (02/05 0500) Weight change: 17 lb 13.7 oz (8.1 kg) Last BM Date: 02/29/12 (liquid stool in colostomy bag)  Intake/Output from previous day: 02/04 0701 - 02/05 0700 In: 13314.3 [I.V.:10639.3; IV Piggyback:1960] Out: 1795 [Urine:875; Emesis/NG output:800; Drains:120] Total I/O In: 401 [I.V.:401] Out: 50 [Emesis/NG output:50]   Physical Exam: General- pt is sedated Resp- No acute REsp distress, intubated CVS- S1S2 regular in  Rhythm but tachycardiac GIT- BS decreased , JP drain in situ  EXT- Trace LE Edema, NO Cyanosis   Lab Results: CBC  Basename 02/29/12 0439 02/28/12 1924  WBC  14.2* 3.9*  HGB 14.5 13.7  HCT 41.7 40.7  PLT 113* 124*    BMET  Basename 02/29/12 0439 02/28/12 2352  NA 136 134*  K 3.6 3.9  CL 111 112  CO2 15* 15*  GLUCOSE 165* 188*  BUN 39* 39*  CREATININE 1.78* 1.84*  CALCIUM 6.3* 5.9*    Creat 2014 1.15==>2.12==>1.78    Anion Gap 136-126=10  Albumin 1.0  Delta AG 10-3=7 Delta Bicarb 24-15=9  Results for Connor, Small (MRN [id number]) as of 02/29/2012 09:41  Ref. Range 02/28/2012 20:10  VT No range found 500  Peep/cpap No range found 5.0  pH, Arterial Latest Range: 7.350-7.450  7.146 (LL)  pCO2 arterial Latest Range: 35.0-45.0 mmHg 36.3  pO2, Arterial Latest Range: 80.0-100.0 mmHg 57.5 (L)  Bicarbonate Latest Range: 20.0-24.0 mEq/L 12.0 (L)  TCO2 Latest Range: 0-100 mmol/L 11.0    Expected PCO2  15 times 1.5  + 8 (+-)2=32--34  Pt PCO2 is 36  MICRO Recent Results (from the past 240 hour(s))  CULTURE, BLOOD (ROUTINE X 2)     Status: Normal (Preliminary result)   Collection Time   02/27/12  4:58 AM      Component Value Range Status Comment   Specimen Description BLOOD RIGHT ARM   Final    Special Requests BOTTLES DRAWN AEROBIC AND ANAEROBIC 6CC   Final  Culture NO GROWTH 1 DAY   Final    Report Status PENDING   Incomplete   CULTURE, BLOOD (ROUTINE X 2)     Status: Normal (Preliminary result)   Collection Time   02/27/12  5:15 AM      Component Value Range Status Comment   Specimen Description BLOOD RIGHT HAND   Final    Special Requests BOTTLES DRAWN AEROBIC AND ANAEROBIC 6CC   Final    Culture NO GROWTH 1 DAY   Final    Report Status PENDING   Incomplete   SURGICAL PCR SCREEN     Status: Normal   Collection Time   02/27/12  3:15 PM      Component Value Range Status Comment   MRSA, PCR NEGATIVE  NEGATIVE Final    Staphylococcus aureus NEGATIVE  NEGATIVE Final       Lab Results  Component Value Date   CALCIUM 6.3* 02/29/2012   PHOS 2.2* 02/28/2012       Se Lactate 4.2   Calcium 6.3 Corrected 3 times  0.8=2.4 6.3+2.4=8.7   Impression: 1)Renal   AKI secondary to ATN   AKI secondary to Septic Shock   NON Oliguric ATN ( Urine output more than 444ml/ 24 hrs)   AKI stable    NO need of Renal replacement therapy yet   2)CVS- Hemodynamically unstable On Pressors   3)Anemia HGb at goal (9--11)  4)CKD Mineral-Bone Disorder PTH not avail /elevated /acceptable / over suppressed Secondary Hyperparathyroidism w/u pending present /absent. Phosphorus at goal. Vitamin 25-OH will check/low/at goal.  5)ID- admitted with C diff colitis Sepsis On ABX  S/P Colectomy    6)FEN Normokalemic NOrmonatremic Corrected calcium fort  Low albumin is normal  7)Acid base Co2 NOT at goal Lactate High Ph Low  High Anion Gap acidosis  + NON AG acidosis  On Bicarb drip  8) Resp- Intubated   9) Prognosis Guarded     Plan:  Agree with current tx and plan NO need of renal replacement therapy       Kosisochukwu Goldberg S 02/29/2012, 9:41 AM

## 2012-02-29 NOTE — Progress Notes (Signed)
Subjective: He is intubated on the ventilator but does make some response when his name is called Connor Small. Blood gas is pending.  Objective: Vital signs in last 24 hours: Temp:  [97.6 F (36.4 C)-98.5 F (36.9 C)] 98 F (36.7 C) (02/05 0820) Pulse Rate:  [29-141] 141  (02/05 0755) Resp:  [9-40] 36  (02/05 0755) BP: (77-140)/(37-84) 118/70 mmHg (02/05 0600) SpO2:  [73 %-100 %] 98 % (02/05 0755) FiO2 (%):  [60 %-100 %] 80.2 % (02/05 0755) Weight:  [107.3 kg (236 lb 8.9 oz)] 107.3 kg (236 lb 8.9 oz) (02/05 0500) Weight change: 8.1 kg (17 lb 13.7 oz) Last BM Date: 02/26/12  Intake/Output from previous day: 02/04 0701 - 02/05 0700 In: 13314.3 [I.V.:10639.3; IV Piggyback:1960] Out: 5597 [Urine:875; Emesis/NG output:800; Drains:120]  PHYSICAL EXAM General appearance: Intubated and sedated but somewhat responsive as noted above Resp: rhonchi bilaterally Cardio: His heart is regular but with a tachycardia GI: I did not examine his abdomen because he is postop Extremities: Mild edema  Lab Results:    Basic Metabolic Panel:  Basename 02/29/12 0439 02/28/12 2352  NA 136 134*  K 3.6 3.9  CL 111 112  CO2 15* 15*  GLUCOSE 165* 188*  BUN 39* 39*  CREATININE 1.78* 1.84*  CALCIUM 6.3* 5.9*  MG -- --  PHOS -- 2.2*   Liver Function Tests:  [facility] 02/29/12 0439 02/28/12 2352  AST 41* 32  ALT 16 12  ALKPHOS 37* 19*  BILITOT 0.3 0.4  PROT 3.0* 2.7*  ALBUMIN 1.0* 1.0*    Basename 02/26/12 1409  LIPASE 9*  AMYLASE --   No results found for this basename: AMMONIA:2 in the last 72 hours CBC:  Basename 02/29/12 0439 02/28/12 1924 02/28/12 0433  WBC 14.2* 3.9* --  NEUTROABS 12.7* -- 4.1  HGB 14.5 13.7 --  HCT 41.7 40.7 --  MCV 93.1 93.8 --  PLT 113* 124* --   Cardiac Enzymes:  Basename 02/26/12 1409  CKTOTAL --  CKMB --  CKMBINDEX --  TROPONINI <0.30   BNP: No results found for this basename: PROBNP:3 in the last 72 hours D-Dimer: No results found for this  basename: DDIMER:2 in the last 72 hours CBG:  Basename 02/29/12 0730 02/29/12 0400 02/29/12 0007 02/28/12 2002 02/28/12 1854 02/28/12 1849  GLUCAP 129* 139* 144* 82 98 49*   Hemoglobin A1C: No results found for this basename: HGBA1C in the last 72 hours Fasting Lipid Panel: No results found for this basename: CHOL,HDL,LDLCALC,TRIG,CHOLHDL,LDLDIRECT in the last 72 hours Thyroid Function Tests: No results found for this basename: TSH,T4TOTAL,FREET4,T3FREE,THYROIDAB in the last 72 hours Anemia Panel: No results found for this basename: VITAMINB12,FOLATE,FERRITIN,TIBC,IRON,RETICCTPCT in the last 72 hours Coagulation: No results found for this basename: LABPROT:2,INR:2 in the last 72 hours Urine Drug Screen: Drugs of Abuse  No results found for this basename: labopia, cocainscrnur, labbenz, amphetmu, thcu, labbarb    Alcohol Level: No results found for this basename: ETH:2 in the last 72 hours Urinalysis:  Basename 02/28/12 1003 02/26/12 1627  COLORURINE YELLOW YELLOW  LABSPEC >1.030* 1.020  PHURINE 6.5 5.5  GLUCOSEU 100* NEGATIVE  HGBUR LARGE* LARGE*  BILIRUBINUR LARGE* SMALL*  KETONESUR 15* TRACE*  PROTEINUR 100* 100*  UROBILINOGEN 2.0* 0.2  NITRITE POSITIVE* NEGATIVE  LEUKOCYTESUR TRACE* NEGATIVE   Misc. Labs:  ABGS  Basename 02/28/12 2010  PHART 7.146*  PO2ART 57.5*  TCO2 11.0  HCO3 12.0*   CULTURES Recent Results (from the past 240 hour(s))  CULTURE, BLOOD (ROUTINE X 2)  Status: Normal (Preliminary result)   Collection Time   02/27/12  4:58 AM      Component Value Range Status Comment   Specimen Description BLOOD RIGHT ARM   Final    Special Requests BOTTLES DRAWN AEROBIC AND ANAEROBIC 6CC   Final    Culture NO GROWTH 1 DAY   Final    Report Status PENDING   Incomplete   CULTURE, BLOOD (ROUTINE X 2)     Status: Normal (Preliminary result)   Collection Time   02/27/12  5:15 AM      Component Value Range Status Comment   Specimen Description BLOOD RIGHT HAND    Final    Special Requests BOTTLES DRAWN AEROBIC AND ANAEROBIC 6CC   Final    Culture NO GROWTH 1 DAY   Final    Report Status PENDING   Incomplete   SURGICAL PCR SCREEN     Status: Normal   Collection Time   02/27/12  3:15 PM      Component Value Range Status Comment   MRSA, PCR NEGATIVE  NEGATIVE Final    Staphylococcus aureus NEGATIVE  NEGATIVE Final    Studies/Results: Dg Chest Port 1 View  02/29/2012  *RADIOLOGY REPORT*  Clinical Data: Respiratory failure  PORTABLE CHEST - 1 VIEW  Comparison: 02/28/2012  Findings: Endotracheal tube tip is above the carina.  There is a right arm PICC line with tip in the cavoatrial junction. Nasogastric tube tip is below the GE junction within the stomach. There are decreased lung volumes.  Bilateral, posterior layering effusions and patchy areas of atelectasis are noted.  Compared with previous exam there appears to be slight increase in interstitial edema.  IMPRESSION:  1.  Stable support apparatus.  The nasogastric tube has been repositioned and is now situated with tip below the GE junction. 2.  Increase in edema.   Original Report Authenticated By: Kerby Moors, M.D.    Portable Chest Xray  02/28/2012  *RADIOLOGY REPORT*  Clinical Data: Endotracheal tube placement.  PORTABLE CHEST - 1 VIEW  Comparison: 02/28/2012  Findings: Endotracheal tube has been placed, tip approximately 2.9 cm above carina.  Nasogastric tube is not coiled back upon itself, tip redirected cephalad at the level of the mid esophagus.  A right- sided PICC line tip overlies the level of the superior vena cava confluence with the right atrium.  Film is very shallow lung inflation.  There is dense consolidation at the left lung base, a new finding.  There are changes of mild pulmonary edema. Bilateral pleural effusions are suspected.  IMPRESSION: 1.  Endotracheal tube placed as described. 2.  Nasogastric tube is coiled. 3.  Interval development of left lower lobe atelectasis or consolidation. 3.   Bilateral pleural effusions.   Original Report Authenticated By: Nolon Nations, M.D.    Dg Chest Port 1 View  02/28/2012  *RADIOLOGY REPORT*  Clinical Data: Confirm line placement  PORTABLE CHEST - 1 VIEW  Comparison: Plain film 02/27/2012  Findings: There is an NG tube with tip at the gastric GE junction. Interval placement of a right PICC line with tip in the distal SVC. Stable cardiac silhouette.  There are low lung volumes and basilar atelectasis.  No pneumothorax.  IMPRESSION:  1.  NG tube with tip at the GE junction. 2.  New right PICC line with tip in distal SVC. 3.  Bibasilar atelectasis and low lung volumes.   Original Report Authenticated By: Suzy Bouchard, M.D.     Medications:  I  have reviewed the patient's current medications. Scheduled:   . antiseptic oral rinse  15 mL Mouth Rinse QID  . chlorhexidine  15 mL Mouth Rinse BID  . hydrocortisone sodium succinate  100 mg Intravenous Q6H  . ipratropium  0.5 mg Nebulization Q4H  . levalbuterol  0.63 mg Nebulization Q4H  . metronidazole  500 mg Intravenous Q6H  . pantoprazole (PROTONIX) IV  40 mg Intravenous Daily  . sodium chloride  10-40 mL Intracatheter Q12H  . sodium chloride  3 mL Intravenous Q12H  . vancomycin  1,000 mg Intravenous Q12H   Continuous:   . phenylephrine (NEO-SYNEPHRINE) Adult infusion 110 mcg/min (02/29/12 0900)  .  sodium bicarbonate  infusion 1000 mL 150 mL/hr at 02/29/12 0900  . vasopressin (PITRESSIN) infusion - *FOR SHOCK* 0.03 Units/min (02/29/12 0900)   PHK:FEXMDYJWLK, fentaNYL, HYDROmorphone (DILAUDID) injection, midazolam, norepinephrine (LEVOPHED) Adult infusion, ondansetron (ZOFRAN) IV, ondansetron, sodium chloride, sodium chloride, vasopressin (PITRESSIN) infusion - *FOR SHOCK*  Assesment: He is postop colectomy for combination of ulcerative colitis and C. difficile colitis. Post been septic and has been on antibiotics and pressors. His blood pressure is better. He still has tachycardia. His  white blood count has come up which I think is probably a good sign. His albumin is very low so I think he is going to need some albumin. His renal function is stable. He has respiratory failure postop Active Problems:  UC (ulcerative colitis)  Dehydration  C. difficile colitis  Colonic stricture  S/P cecostomy  ARF (acute renal failure)  Sinus tachycardia  Hematuria    Plan: I have ordered albumin. I discussed his situation with Dr. Anastasio Champion who plans to start parenteral nutrition. I don't think is anything else to add right now    LOS: 3 days   Yu Cragun L 02/29/2012, 9:01 AM

## 2012-02-29 NOTE — Addendum Note (Signed)
Addendum  created 02/29/12 1037 by Jule Economy, CRNA   Modules edited:Notes Section

## 2012-02-29 NOTE — Progress Notes (Signed)
Dr. Anastasio Champion paged to update on HR status and that NS bolus was half way through w/ no change in HR

## 2012-02-29 NOTE — Progress Notes (Signed)
INITIAL NUTRITION ASSESSMENT  DOCUMENTATION CODES Per approved criteria  -Obesity Unspecified   INTERVENTION: 1. Continue Clinimix E 5/15, advance rate as appropriate to better meet nutrition needs.   2. Continue 20% lipids at 10 mL (MWF-due to national backorder)  3. RD will continue to follow  NUTRITION DIAGNOSIS: Inadequate oral intake related to inability to eat as evidenced by NPO status and s/p GI surgery.   Goal: Meet >/=90% estimated nutrition needs  Monitor:  TPN regimen, I/O's, weight trends, labs  Reason for Assessment: Vent pt  52 y.o. male  Admitting Dx:  C/o diarrhea, abdominal plain, body aches, and fever  ASSESSMENT: Pt admitted with diarrhea, body aches, abdominal pain and fever, no n/v.  Pt previously underwent colonic resection and though to potential have a stricture at the site of anastomosis.  S/p cecostomy tube placement and intubation on 2/4. Pt presenting with ARF 2/2 acute illness.  Pt with severe C. 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   TPN regimen: Clinimix E 5/15 at 50 mL/hr and 20% lipids at 10 mL/hr (lipids given MWF due to national backorder). This regimen provides a daily average of 1058 kcal, 53% minimum estimated kcal needs, and 60 grams of protein, 46% minimum estimated protein needs.  Height: Ht Readings from Last 1 Encounters:  02/26/12 5' 9"  (1.753 m)    Weight: Wt Readings from Last 1 Encounters:  02/29/12 236 lb 8.9 oz (107.3 kg)    Ideal Body Weight: 160 lbs (72.7 kg)  % Ideal Body Weight: 147.5%  Wt Readings from Last 10 Encounters:  02/29/12 236 lb 8.9 oz (107.3 kg)  02/29/12 236 lb 8.9 oz (107.3 kg)  02/29/12 236 lb 8.9 oz (107.3 kg)  02/29/12 236 lb 8.9 oz (107.3 kg)  01/16/12 204 lb 1.6 oz (92.579 kg)  07/18/11 198 lb 11.2 oz (90.13 kg)  02/16/11 186 lb (84.369 kg)  02/16/11 186 lb (84.369 kg)  12/06/10 190 lb (86.183 kg)  11/06/10 197 lb (89.359 kg)    Usual Body Weight: 198 lbs (90  kg)  % Usual Body Weight: 119%  BMI:  Body mass index is 34.93 kg/(m^2).--Obesity Class II  Estimated Nutritional Needs: Kcal: 2000-2200 Protein: 130-140 grams Fluid: 2.1-2.3 L/day  Skin: abdominal incision  Diet Order: NPO  EDUCATION NEEDS: -No education needs identified at this time   Intake/Output Summary (Last 24 hours) at 02/29/12 1327 Last data filed at 02/29/12 1300  Gross per 24 hour  Intake 15400.91 ml  Output   1485 ml  Net 13915.91 ml    Last BM: PTA  Labs:   Lab 02/29/12 0439 02/28/12 2352 02/28/12 1924  NA 136 134* 138  K 3.6 3.9 5.0  CL 111 112 117*  CO2 15* 15* 15*  BUN 39* 39* 37*  CREATININE 1.78* 1.84* 1.90*  CALCIUM 6.3* 5.9* 5.7*  MG -- -- --  PHOS -- 2.2* --  GLUCOSE 165* 188* 117*    CBG (last 3)   Basename 02/29/12 1110 02/29/12 0730 02/29/12 0400  GLUCAP 134* 129* 139*    Scheduled Meds:   . albumin human  25 g Intravenous Q6H  . antiseptic oral rinse  15 mL Mouth Rinse QID  . chlorhexidine  15 mL Mouth Rinse BID  . hydrocortisone sodium succinate  100 mg Intravenous Q6H  . insulin aspart  0-20 Units Subcutaneous Q4H  . ipratropium  0.5 mg Nebulization Q4H  . levalbuterol  0.63 mg Nebulization Q4H  . metronidazole  500 mg Intravenous  Q6H  . pantoprazole (PROTONIX) IV  40 mg Intravenous Daily  . sodium chloride  10-40 mL Intracatheter Q12H  . sodium chloride  3 mL Intravenous Q12H  . vancomycin  1,000 mg Intravenous Q12H    Continuous Infusions:   . TPN (CLINIMIX) +/- additives     And  . fat emulsion    . phenylephrine (NEO-SYNEPHRINE) Adult infusion 110 mcg/min (02/29/12 1300)  .  sodium bicarbonate  infusion 1000 mL 150 mL/hr at 02/29/12 1309  .  sodium bicarbonate  infusion 1000 mL    . vasopressin (PITRESSIN) infusion - *FOR SHOCK* 0.01 Units/min (02/29/12 1300)    Past Medical History  Diagnosis Date  . Chronic diarrhea   . Rectal bleed   . Hemorrhoids   . Ulcerative colitis     Distal UC over 8 yrs ago  diagnosed  . Diverticulitis of large intestine with perforation 10/2011    done at [place]    Past Surgical History  Procedure Date  . Colonoscopy 9/03  . Sigmoidoscopy      06/13/2002  . Temporary ostomy November 06, 2010     for a colon perforation that was done in [facility] (Dr Payton Doughty).    . Colonoscopy 02/16/2011    Procedure: COLONOSCOPY;  Surgeon: Rogene Houston, MD;  Location: [facility];  Service: Endoscopy;  Laterality: N/A;  100  . Flexible sigmoidoscopy 02/27/2012    Procedure: FLEXIBLE SIGMOIDOSCOPY;  Surgeon: Rogene Houston, MD;  Location: [facility];  Service: Endoscopy;  Laterality: N/A;  with colonic decompression  . Laparotomy 02/27/2012    Procedure: EXPLORATORY LAPAROTOMY;  Surgeon: Donato Heinz, MD;  Location: [facility] OR[facility];  Service: General;  Laterality: N/A;  . Cecostomy 02/27/2012    Procedure: CECOSTOMY;  Surgeon: Donato Heinz, MD;  Location: [facility];  Service: General;  Laterality: N/A;  Cecostomy Tube Placement     Jarome Matin Dietetic Intern # [phone]

## 2012-02-29 NOTE — Progress Notes (Addendum)
Subjective: This man went for surgery yesterday and had a total colectomy. He is on the ventilator, receiving 2 vasopressors for blood pressure support, is on a bicarbonate drip and sedated.           Physical Exam: Blood pressure 118/70, pulse 140, temperature 98.2 F (36.8 C), temperature source Axillary, resp. rate 33, height 5' 9"  (1.753 m), weight 107.3 kg (236 lb 8.9 oz), SpO2 99.00%. Well-perfused. Sedated. Lung fields anteriorly are clear. Abdomen difficult to assess as he is on sedative medications that appears to be soft. Bowel sounds not heard.   Investigations:  Recent Results (from the past 240 hour(s))  CULTURE, BLOOD (ROUTINE X 2)     Status: Normal (Preliminary result)   Collection Time   02/27/12  4:58 AM      Component Value Range Status Comment   Specimen Description BLOOD RIGHT ARM   Final    Special Requests BOTTLES DRAWN AEROBIC AND ANAEROBIC 6CC   Final    Culture NO GROWTH 1 DAY   Final    Report Status PENDING   Incomplete   CULTURE, BLOOD (ROUTINE X 2)     Status: Normal (Preliminary result)   Collection Time   02/27/12  5:15 AM      Component Value Range Status Comment   Specimen Description BLOOD RIGHT HAND   Final    Special Requests BOTTLES DRAWN AEROBIC AND ANAEROBIC 6CC   Final    Culture NO GROWTH 1 DAY   Final    Report Status PENDING   Incomplete   SURGICAL PCR SCREEN     Status: Normal   Collection Time   02/27/12  3:15 PM      Component Value Range Status Comment   MRSA, PCR NEGATIVE  NEGATIVE Final    Staphylococcus aureus NEGATIVE  NEGATIVE Final      Basic Metabolic Panel:  Basename 02/29/12 0439 02/28/12 2352  NA 136 134*  K 3.6 3.9  CL 111 112  CO2 15* 15*  GLUCOSE 165* 188*  BUN 39* 39*  CREATININE 1.78* 1.84*  CALCIUM 6.3* 5.9*  MG -- --  PHOS -- 2.2*   Liver Function Tests:  [facility] 02/29/12 0439 02/28/12 2352  AST 41* 32  ALT 16 12  ALKPHOS 37* 19*  BILITOT 0.3 0.4  PROT 3.0* 2.7*  ALBUMIN 1.0* 1.0*      CBC:  Basename 02/29/12 0439 02/28/12 1924 02/28/12 0433  WBC 14.2* 3.9* --  NEUTROABS 12.7* -- 4.1  HGB 14.5 13.7 --  HCT 41.7 40.7 --  MCV 93.1 93.8 --  PLT 113* 124* --    Dg Chest Port 1 View  02/29/2012  *RADIOLOGY REPORT*  Clinical Data: Respiratory failure  PORTABLE CHEST - 1 VIEW  Comparison: 02/28/2012  Findings: Endotracheal tube tip is above the carina.  There is a right arm PICC line with tip in the cavoatrial junction. Nasogastric tube tip is below the GE junction within the stomach. There are decreased lung volumes.  Bilateral, posterior layering effusions and patchy areas of atelectasis are noted.  Compared with previous exam there appears to be slight increase in interstitial edema.  IMPRESSION:  1.  Stable support apparatus.  The nasogastric tube has been repositioned and is now situated with tip below the GE junction. 2.  Increase in edema.   Original Report Authenticated By: Kerby Moors, M.D.    Portable Chest Xray  02/28/2012  *RADIOLOGY REPORT*  Clinical Data: Endotracheal tube placement.  PORTABLE CHEST - 1 VIEW  Comparison: 02/28/2012  Findings: Endotracheal tube has been placed, tip approximately 2.9 cm above carina.  Nasogastric tube is not coiled back upon itself, tip redirected cephalad at the level of the mid esophagus.  A right- sided PICC line tip overlies the level of the superior vena cava confluence with the right atrium.  Film is very shallow lung inflation.  There is dense consolidation at the left lung base, a new finding.  There are changes of mild pulmonary edema. Bilateral pleural effusions are suspected.  IMPRESSION: 1.  Endotracheal tube placed as described. 2.  Nasogastric tube is coiled. 3.  Interval development of left lower lobe atelectasis or consolidation. 3.  Bilateral pleural effusions.   Original Report Authenticated By: Nolon Nations, M.D.    Dg Chest Port 1 View  02/28/2012  *RADIOLOGY REPORT*  Clinical Data: Confirm line placement   PORTABLE CHEST - 1 VIEW  Comparison: Plain film 02/27/2012  Findings: There is an NG tube with tip at the gastric GE junction. Interval placement of a right PICC line with tip in the distal SVC. Stable cardiac silhouette.  There are low lung volumes and basilar atelectasis.  No pneumothorax.  IMPRESSION:  1.  NG tube with tip at the GE junction. 2.  New right PICC line with tip in distal SVC. 3.  Bibasilar atelectasis and low lung volumes.   Original Report Authenticated By: Suzy Bouchard, M.D.    Dg Abd 2 Views  02/27/2012  *RADIOLOGY REPORT*  Clinical Data: Abdominal pain.  Evaluate for obstruction.  History of ulcerative colitis.  ABDOMEN - 2 VIEW  Comparison: CT scan from 02/26/2012  Findings: Upright film shows no evidence for intraperitoneal free air.  Upright film shows multiple colonic large air-fluid levels. Supine film shows diffuse gaseous colonic distention in the transverse and left colon.  Contrast material in the urinary bladder is compatible with the CT scan yesterday.  No gaseous small bowel dilatation.  IMPRESSION: Gaseous distention of colon noted down to the level of the rectum. Given the history of ulcerative colitis and rectosigmoid anastomoses, anastomotic stricture would be a consideration.  I personally discussed the results of this study with Dr. Conley Canal at to 0910 hours on 02/27/2012.   Original Report Authenticated By: Misty Stanley, M.D.       Medications: I have reviewed the patient's current medications.  Impression: 1. 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 2. Acute renal failure, probable ATN and also sepsis contributing. 3. Septic shock, secondary to #1 requiring vasopressor support. 4. History of ulcer colitis. On intravenous steroids.     Plan: 1. Continue with supportive treatment at the present time with vasopressors, bicarbonate drip and ventilator support. 2. Monitor renal function closely. 3. Initiate TPN per  pharmacy. I have updated the patient's wife and daughter at the bedside and they understand the severity of the patient's illness and the possibility of deterioration.     LOS: 3 days   Doree Albee Pager [phone]  02/29/2012, 8:08 AM

## 2012-02-29 NOTE — Progress Notes (Signed)
Patient's condition reviewed with his wife and their daughter. He was just begun on ARDS protocol by Dr. Saralyn Pilar Wright(E. link physician). Urine output in the last 8 hours was 550 ml. Scant amount of blood-tinged fluid in NG tube and ileostomy bag but no evidence of frank bleeding. He is also receiving IV albumin and parenteral nutrition to be begun this evening. Patient remains critically ill and clinical course over the next 24 hours will determine his prognosis.

## 2012-02-29 NOTE — Op Note (Signed)
Patient:  Connor Small  DOB:  [birth year]  MRN:  [id number]   Preop Diagnosis:  Refractory clostridium difficile infection, toxic megacolon  Postop Diagnosis:  The same  Procedure:  Total abdominal colectomy with end ileostomy, intra-abdominal drain placement x2  Surgeon:  Dr. Tilford Pillar  Anes:  General endotracheal  Indications:  Patient is a 52 year old male who presented to [facility] flulike symptoms. His clinical condition continued to worsen and he was taken to the operating room for a decompressive cecostomy yesterday. His cardiac instability actually improved following the cecostomy and demonstrated improved urine function. Earlier this morning patient was demonstrating continued stability with a normalized WBC count. Patient's tachycardia had improved. The patient had continued to have good urine output throughout the night. Approximately mid morning the patient's abdomen again became distended. While there was cecostomy output his abdomen became more tender. Patient became more confused. His urine function worsened becoming oliguric. Patient again became tachycardic and concerns were that his C. difficile infection was worsening. Return to the operating room as discussed with the patient and family. Plan total abdominal colectomy and end ileostomy were discussed. Risks benefits and alternatives discussed. Patient and family consented for the planned procedure.  Procedure note:  Patient was taken to the operating room was placed in supine position on the or table. General anesthetic was administered and the patient was again endotracheally intubated by the nurse anesthetist. At this point his abdomen is prepped with Betadine solution and draped in standard fashion. The red rubber catheter the cecostomy was into the field. Time out was performed. The skin staples were removed with a hemostat as well as a staple removing device. The fascial suture was cut with scissors. The fascia  was opened and there was some clear nonodorous ascitic fluid encountered. Small bowel appeared healthy and viable. The colon was again noted to be severely distended. At this time I started the dissection in the pelvis. Due to the enlarged size of the colon I was not able to divide the distal to the palpable stricture I therefore created a window in the sigmoid mesocolon through which a TA 65 stapler was utilized to ligate the distal colon. Proximal to the staple line I placed a bowel clamp and divided the distal colon between the clamp and the staple line. Minimal stool spillage was encountered which was quickly aspirated. The mesocolon was then divided proximally using a LigaSure device. The peritoneal reflection onto the left colon was divided using electrocautery along the white line of Toltz. This dissection was carried up to the splenic flexure. In order to better facilitate mobilization of the splenic flexure I also began to mobilize the bowel from a proximal to distal fashion along the transverse colon. The gastrocolic ligament was divided using the LigaSure. The transverse mucosal colon was also divided using the LigaSure. While placing traction on the colon to help facilitate mobilization of the splenic flexure a small colotomy was created with some stool spillage. This was controlled with aspiration and clamps were placed both proximally and distally to minimize additional spillage. With the descending and hepatic flexure portions of the colon mobilized I positive this time to irrigate the left upper quadrant. Returning aspirate was clear. Hemostasis was excellent. At this time I continued dissection proximally along the transverse mesocolon and around the hepatic flexure. At this time I did identify the appendix which was coursing towards the liver and a retrocecal fashion. The mesoappendix was divided with the ligature bipolar device. Prior to  removing the cecostomy tube I divided the distal terminal  ileum. This was performed by creating a window in the mesentery the small bowel and using a GIA 55 staple load to divide the small bowel. At this time the skin suture holding the cecostomy tube was divided and the tube was removed from the field. The cecostomy opening was quickly closed using a Kelly clamp. Minimal spillage was encountered. The remaining me so colon was divided using the ligature device.  At this point the specimen was free it was placed in the back table sent as a permanent specimen to pathology. Hemostasis was excellent.  Returning back to the pelvis I inspected the remnant of the rectum. Palpation confirmed as well as proximal to the stricture which is at the level of the pelvic floor. It was noted that the had become devascularized suggesting that the stricture may be due to poor vascular perfusion.  Due to the level of the stricture I opted to divide the distal colon sharply. The strictured anastomosis was sent as a separate specimen to pathology. The mucosa of the rectum and the peritoneum were closed with a running 3-0 Prolene suture. The tails of the suture were left long for future identification of the rectal stump.  All scrub members of the surgical team at this time change gown and gloves. At this time the abdominal cavity was copiously irrigated with warm sterile saline. All 4 quadrants of the abdomen were irrigated. The small bowel and mesentery were also carefully irrigated with warm saline.  Irrigation continued until all evidence of debris was gone. Returning aspirate was clear. Hemostasis is excellent. At this time I turned my attention to creation of the end ileostomy. The previous cecostomy site was enlarged. 3 finger dilatation of the fascial defect was performed. The terminal ileum was inspected and appeared viable and is passed through the abdominal wall.  2 stab incisions were placed in the left lower quadrant through which 210 flat JP drains were placed. The medial drain  was placed into the pelvis over the rectal stump and up along the right paracolic gutter. The lateral JP drain was placed into the left paracolic gutter and into the left upper quadrant.  Drains were secured to the skin with a 3-0 nylon. Inspection of the terminal ileum again demonstrated viability and at this time I turned my attention to closure. The fascia was closed using a oh looped PDS x2. The sutures were secured to each other in the midline of the incision. The skin edges were reapproximated after irrigating the subcutaneous tissue with warm saline with skin staples. The skin was covered with a sterile towel and receded to mature the ileostomy. The terminal ileum was divided. Bleeding was noted from the cut edge of the mucosa. 3-0 Vicryl's were utilized to mature the ileostomy. Digital inspection of the lumen demonstrated to be widely patent through the fascial defect. At this time the skin was washed and dried around the ileostomy. Benzoin is applied. The ileostomy appliance was placed. The incision was then covered with sterile dressings. The drapes removed. The sterile dressings were secured. The patient was left intubated and was transferred to the intensive care he didn't stable condition. At the conclusion of the procedure all instrument, sponge, needle counts are correct.  Complications:  None apparent  EBL:  200 mL  Specimen:  Colon, colorectal anastomosis  Fluid: 7000 mL's crystalloid, 1000 mL Hespan  Urine output: 150 ML's

## 2012-02-29 NOTE — Progress Notes (Signed)
Paged Dr. Anastasio Champion concerning pt's HR sustaining mid 140's despite versed and most recently fentanyl prn administrations.  Will cont to monitor and await any further orders.

## 2012-02-29 NOTE — Progress Notes (Signed)
[place] for Vancomycin Indication: Cdiff , sepsis  Allergies  Allergen Reactions  . Penicillins Other (See Comments)    Heart rate changes   Patient Measurements: Height: 5' 9"  (175.3 cm) Weight: 236 lb 8.9 oz (107.3 kg) IBW/kg (Calculated) : 70.7   Vital Signs: Temp: 98 F (36.7 C) (02/05 0820) Temp src: Axillary (02/05 0820) BP: 101/72 mmHg (02/05 0915) Pulse Rate: 146  (02/05 1100) Intake/Output from previous day: 02/04 0701 - 02/05 0700 In: 13314.3 [I.V.:10639.3; IV Piggyback:1960] Out: 0630 [Urine:875; Emesis/NG output:800; Drains:120] Intake/Output from this shift: Total I/O In: 1587.6 [I.V.:877.6; IV Piggyback:710] Out: 50 [Emesis/NG output:50]  Labs:  [facility] 02/29/12 0439 02/28/12 2352 02/28/12 1924 02/28/12 0433  WBC 14.2* -- 3.9* 6.9  HGB 14.5 -- 13.7 18.8*  PLT 113* -- 124* 182  LABCREA -- -- -- --  CREATININE 1.78* 1.84* 1.90* --   Estimated Creatinine Clearance: 57.9 ml/min (by C-G formula based on Cr of 1.78). No results found for this basename: VANCOTROUGH:2,VANCOPEAK:2,VANCORANDOM:2,GENTTROUGH:2,GENTPEAK:2,GENTRANDOM:2,TOBRATROUGH:2,TOBRAPEAK:2,TOBRARND:2,AMIKACINPEAK:2,AMIKACINTROU:2,AMIKACIN:2, in the last 72 hours   Microbiology: Recent Results (from the past 720 hour(s))  CULTURE, BLOOD (ROUTINE X 2)     Status: Normal (Preliminary result)   Collection Time   02/27/12  4:58 AM      Component Value Range Status Comment   Specimen Description BLOOD RIGHT ARM   Final    Special Requests BOTTLES DRAWN AEROBIC AND ANAEROBIC 6CC   Final    Culture NO GROWTH 1 DAY   Final    Report Status PENDING   Incomplete   CULTURE, BLOOD (ROUTINE X 2)     Status: Normal (Preliminary result)   Collection Time   02/27/12  5:15 AM      Component Value Range Status Comment   Specimen Description BLOOD RIGHT HAND   Final    Special Requests BOTTLES DRAWN AEROBIC AND ANAEROBIC 6CC   Final    Culture NO GROWTH 1 DAY   Final     Report Status PENDING   Incomplete   SURGICAL PCR SCREEN     Status: Normal   Collection Time   02/27/12  3:15 PM      Component Value Range Status Comment   MRSA, PCR NEGATIVE  NEGATIVE Final    Staphylococcus aureus NEGATIVE  NEGATIVE Final    Medical History: Past Medical History  Diagnosis Date  . Chronic diarrhea   . Rectal bleed   . Hemorrhoids   . Ulcerative colitis     Distal UC over 8 yrs ago diagnosed  . Diverticulitis of large intestine with perforation 10/2011    done at [place]   Medications:  Scheduled:     . [COMPLETED] albumin human  12.5 g Intravenous Once  . albumin human  25 g Intravenous Q6H  . antiseptic oral rinse  15 mL Mouth Rinse QID  . [COMPLETED] calcium gluconate  1 g Intravenous Once  . chlorhexidine  15 mL Mouth Rinse BID  . [COMPLETED] ciprofloxacin  400 mg Intravenous On Call to OR  . [COMPLETED] dextrose  1 ampule Intravenous Once  . [EXPIRED] dextrose      . hydrocortisone sodium succinate  100 mg Intravenous Q6H  . insulin aspart  0-20 Units Subcutaneous Q4H  . [COMPLETED] insulin aspart  6 Units Subcutaneous Once  . ipratropium  0.5 mg Nebulization Q4H  . levalbuterol  0.63 mg Nebulization Q4H  . metronidazole  500 mg Intravenous Q6H  . [COMPLETED] midazolam      .  pantoprazole (PROTONIX) IV  40 mg Intravenous Daily  . [COMPLETED] sodium bicarbonate      . [COMPLETED] sodium chloride  1,000 mL Intravenous Once  . [COMPLETED] sodium chloride  500 mL Intravenous Once  . [COMPLETED] sodium chloride  500 mL Intravenous Once  . sodium chloride  500 mL Intravenous Once  . sodium chloride  10-40 mL Intracatheter Q12H  . sodium chloride  3 mL Intravenous Q12H  . vancomycin  1,000 mg Intravenous Q12H  . [DISCONTINUED] albumin human  12.5 g Intravenous Once  . [DISCONTINUED] albuterol  2.5 mg Nebulization Q4H  . [DISCONTINUED] pantoprazole (PROTONIX) IV  40 mg Intravenous Q24H  . [DISCONTINUED] sodium chloride  1,000 mL Intravenous Once   . [DISCONTINUED] vancomycin  500 mg Per Tube Q6H  . [DISCONTINUED] vancomycin (VANCOCIN) rectal ENEMA  500 mg Rectal BID  . [DISCONTINUED] vancomycin (VANCOCIN) rectal ENEMA  500 mg Rectal BID   Assessment: 52 yo M with severe Cdiff colitis/?sepsis.  Per ID recommendations IV Vancomycin was started on 2/4.  Renal fxn seems to have stabilized.  Estimated Creatinine Clearance: 57.9 ml/min (by C-G formula based on Cr of 1.78).   Goal of Therapy:  Vancomycin trough level 15-20 mcg/ml  Plan:  1) continue Vancomycin 1gm IV Q12h 2) Check Vancomycin trough tomorrow 3) Monitor renal function and cx data   Hart Robinsons A 02/29/2012,11:21 AM

## 2012-02-29 NOTE — Progress Notes (Addendum)
PARENTERAL NUTRITION CONSULT NOTE - INITIAL  Pharmacy Consult for TPN Indication: hypoalbuminemia, s/p colectomy, intubated   Allergies  Allergen Reactions  . Penicillins Other (See Comments)    Heart rate changes   Patient Measurements: Height: 5' 9"  (175.3 cm) Weight: 236 lb 8.9 oz (107.3 kg) IBW/kg (Calculated) : 70.7  Adjusted Body Weight: 82Kg  Vital Signs: Temp: 98 F (36.7 C) (02/05 0820) Temp src: Axillary (02/05 0820) BP: 101/72 mmHg (02/05 0915) Pulse Rate: 147  (02/05 1015) Intake/Output from previous day: 02/04 0701 - 02/05 0700 In: 13314.3 [I.V.:10639.3; IV Piggyback:1960] Out: 7824 [Urine:875; Emesis/NG output:800; Drains:120] Intake/Output from this shift: Total I/O In: 637.3 [I.V.:637.3] Out: 50 [Emesis/NG output:50]  Labs:   [facility] 02/29/12 0439 02/28/12 1924 02/28/12 0433  WBC 14.2* 3.9* 6.9  HGB 14.5 13.7 18.8*  HCT 41.7 40.7 54.4*  PLT 113* 124* 182  APTT -- -- --  INR -- -- --     Basename 02/29/12 0439 02/28/12 2352 02/28/12 1924 02/28/12 0433 02/27/12 0500  NA 136 134* 138 -- --  K 3.6 3.9 5.0 -- --  CL 111 112 117* -- --  CO2 15* 15* 15* -- --  GLUCOSE 165* 188* 117* -- --  BUN 39* 39* 37* -- --  CREATININE 1.78* 1.84* 1.90* -- --  LABCREA -- -- -- -- --  CREAT24HRUR -- -- -- -- --  CALCIUM 6.3* 5.9* 5.7* -- --  MG -- -- -- -- --  PHOS -- 2.2* -- -- --  PROT 3.0* 2.7* 2.3* -- --  ALBUMIN 1.0* 1.0* 0.7* -- --  AST 41* 32 24 -- --  ALT 16 12 10  -- --  ALKPHOS 37* 19* 19* -- --  BILITOT 0.3 0.4 0.2* -- --  BILIDIR -- -- -- 0.4* 0.3  IBILI -- -- -- 0.3 0.6  PREALBUMIN -- -- -- -- --  TRIG -- -- -- -- --  CHOLHDL -- -- -- -- --  CHOL -- -- -- -- --   Estimated Creatinine Clearance: 57.9 ml/min (by C-G formula based on Cr of 1.78).    Basename 02/29/12 0730 02/29/12 0400 02/29/12 0007  GLUCAP 129* 139* 144*   Medical History: Past Medical History  Diagnosis Date  . Chronic diarrhea   . Rectal bleed   . Hemorrhoids   .  Ulcerative colitis     Distal UC over 8 yrs ago diagnosed  . Diverticulitis of large intestine with perforation 10/2011    done at [place]   Medications:  Scheduled:    . [COMPLETED] albumin human  12.5 g Intravenous Once  . albumin human  25 g Intravenous Q6H  . antiseptic oral rinse  15 mL Mouth Rinse QID  . [COMPLETED] calcium gluconate  1 g Intravenous Once  . chlorhexidine  15 mL Mouth Rinse BID  . [COMPLETED] ciprofloxacin  400 mg Intravenous On Call to OR  . [COMPLETED] dextrose  1 ampule Intravenous Once  . [EXPIRED] dextrose      . hydrocortisone sodium succinate  100 mg Intravenous Q6H  . [COMPLETED] insulin aspart  6 Units Subcutaneous Once  . ipratropium  0.5 mg Nebulization Q4H  . levalbuterol  0.63 mg Nebulization Q4H  . metronidazole  500 mg Intravenous Q6H  . [COMPLETED] midazolam      . pantoprazole (PROTONIX) IV  40 mg Intravenous Daily  . [COMPLETED] sodium bicarbonate      . [COMPLETED] sodium chloride  1,000 mL Intravenous Once  . [COMPLETED] sodium chloride  500  mL Intravenous Once  . [COMPLETED] sodium chloride  500 mL Intravenous Once  . sodium chloride  500 mL Intravenous Once  . sodium chloride  10-40 mL Intracatheter Q12H  . sodium chloride  3 mL Intravenous Q12H  . vancomycin  1,000 mg Intravenous Q12H  . [DISCONTINUED] albumin human  12.5 g Intravenous Once  . [DISCONTINUED] albuterol  2.5 mg Nebulization Q4H  . [DISCONTINUED] pantoprazole (PROTONIX) IV  40 mg Intravenous Q24H  . [DISCONTINUED] sodium chloride  1,000 mL Intravenous Once  . [DISCONTINUED] vancomycin  500 mg Per Tube Q6H  . [DISCONTINUED] vancomycin (VANCOCIN) rectal ENEMA  500 mg Rectal BID  . [DISCONTINUED] vancomycin (VANCOCIN) rectal ENEMA  500 mg Rectal BID   Insulin Requirements in the past 24 hours:  n/a  Current Nutrition:  None.  Initiating TPN today  Assessment and Discussion: 52yo male s/p colectomy in ICU, intubated and on ventilator.  Possible sepsis, was  started on ABX and pressors.  Extremely low albumin may have a dilutional component given recent fluid resuscitation.  Corrected Calcium = 8.7.  MD has ordered Albumin 25gm IV q6hrs.  Na and K = OK.  Sugars slightly elevated, Pt is on steroids.   Renal fxn is OK.  Estimated Creatinine Clearance: 57.9 ml/min (by C-G formula based on Cr of 1.78).  Nutritional Goals:  1900-2100 kCal, 120-140 grams of protein per day (ABW)  Plan: *  Begin Clinimix 5/15 E (electrolytes) today at 71m/hr *  Adjust IV fluids accordingly *  Order sliding scale insulin q4hrs *  Monitor lytes, renal fxn, glucose tolerance, LFT's and fluid status per protocol *  Will add MVI, trace elements and Lipids every MWF due to national shortage.  [place] Crane Tiona Ruane A 02/29/2012,10:57 AM

## 2012-02-29 NOTE — Plan of Care (Signed)
Problem: Phase I Progression Outcomes Goal: Voiding-avoid urinary catheter unless indicated Outcome: Not Progressing Post surgery  Problem: Phase II Progression Outcomes Goal: Progress activity as tolerated unless otherwise ordered Outcome: Not Progressing Pt vented after surgery, bedfast  Problem: Phase I Progression Outcomes Goal: Sepsis Protocol initiated if indicated Outcome: Not Applicable Date Met:  17/47/15 Off of sepsis protocol Goal: Patient tolerating nututrition at goal Outcome: Not Applicable Date Met:  95/39/67 Pt npo/vented Goal: Patient tolerating weaning plan Outcome: Not Applicable Date Met:  28/97/91 Not to weaning protocol yet

## 2012-02-29 NOTE — Progress Notes (Signed)
1 Day Post-Op  Subjective: Intubated on vent.  Sedated.    Objective: Vital signs in last 24 hours: Temp:  [98 F (36.7 C)-100 F (37.8 C)] 99.6 F (37.6 C) (02/05 2000) Pulse Rate:  [81-151] 131  (02/05 1900) Resp:  [9-36] 24  (02/05 1900) BP: (81-126)/(51-84) 101/72 mmHg (02/05 0915) SpO2:  [73 %-100 %] 95 % (02/05 1918) Arterial Line BP: (82-122)/(46-72) 104/57 mmHg (02/05 1900) FiO2 (%):  [69.7 %-100 %] 70 % (02/05 1918) Weight:  [107.3 kg (236 lb 8.9 oz)] 107.3 kg (236 lb 8.9 oz) (02/05 0500) Last BM Date: 02/29/12 (liquid stool in colostomy bag)  Intake/Output from previous day: 02/04 0701 - 02/05 0700 In: 13314.3 [I.V.:10639.3; IV Piggyback:1960] Out: 9767 [Urine:875; Emesis/NG output:800; Drains:120] Intake/Output this shift: Total I/O In: 81.3 [I.V.:81.3] Out: -   General appearance: sedated on vent. Resp: course bilateral breath sounds.   Cardio: tachycardic. GI: quiet,  soft. moderate distended.  dressing intact.  No shadowing.  Ileostomy purple.  not retracted.   Extremities: +edema.  Lab Results:   [facility] 02/29/12 0439 02/28/12 1924  WBC 14.2* 3.9*  HGB 14.5 13.7  HCT 41.7 40.7  PLT 113* 124*   BMET  Basename 02/29/12 0439 02/28/12 2352  NA 136 134*  K 3.6 3.9  CL 111 112  CO2 15* 15*  GLUCOSE 165* 188*  BUN 39* 39*  CREATININE 1.78* 1.84*  CALCIUM 6.3* 5.9*   PT/INR No results found for this basename: LABPROT:2,INR:2 in the last 72 hours ABG  Basename 02/29/12 1958 02/29/12 1810  PHART 7.327* 7.274*  HCO3 20.2 19.7*    Studies/Results: Dg Chest Port 1 View  02/29/2012  *RADIOLOGY REPORT*  Clinical Data: Respiratory failure  PORTABLE CHEST - 1 VIEW  Comparison: 02/28/2012  Findings: Endotracheal tube tip is above the carina.  There is a right arm PICC line with tip in the cavoatrial junction. Nasogastric tube tip is below the GE junction within the stomach. There are decreased lung volumes.  Bilateral, posterior layering effusions and  patchy areas of atelectasis are noted.  Compared with previous exam there appears to be slight increase in interstitial edema.  IMPRESSION:  1.  Stable support apparatus.  The nasogastric tube has been repositioned and is now situated with tip below the GE junction. 2.  Increase in edema.   Original Report Authenticated By: Kerby Moors, M.D.    Portable Chest Xray  02/28/2012  *RADIOLOGY REPORT*  Clinical Data: Endotracheal tube placement.  PORTABLE CHEST - 1 VIEW  Comparison: 02/28/2012  Findings: Endotracheal tube has been placed, tip approximately 2.9 cm above carina.  Nasogastric tube is not coiled back upon itself, tip redirected cephalad at the level of the mid esophagus.  A right- sided PICC line tip overlies the level of the superior vena cava confluence with the right atrium.  Film is very shallow lung inflation.  There is dense consolidation at the left lung base, a new finding.  There are changes of mild pulmonary edema. Bilateral pleural effusions are suspected.  IMPRESSION: 1.  Endotracheal tube placed as described. 2.  Nasogastric tube is coiled. 3.  Interval development of left lower lobe atelectasis or consolidation. 3.  Bilateral pleural effusions.   Original Report Authenticated By: Nolon Nations, M.D.    Dg Chest Port 1 View  02/28/2012  *RADIOLOGY REPORT*  Clinical Data: Confirm line placement  PORTABLE CHEST - 1 VIEW  Comparison: Plain film 02/27/2012  Findings: There is an NG tube with tip at the gastric  GE junction. Interval placement of a right PICC line with tip in the distal SVC. Stable cardiac silhouette.  There are low lung volumes and basilar atelectasis.  No pneumothorax.  IMPRESSION:  1.  NG tube with tip at the GE junction. 2.  New right PICC line with tip in distal SVC. 3.  Bibasilar atelectasis and low lung volumes.   Original Report Authenticated By: Suzy Bouchard, M.D.     Anti-infectives: Anti-infectives     Start     Dose/Rate Route Frequency Ordered Stop    02/29/12 0600   ciprofloxacin (CIPRO) IVPB 400 mg        400 mg 200 mL/hr over 60 Minutes Intravenous On call to O.R. 02/28/12 1409 02/28/12 1438   02/28/12 2200   vancomycin (VANCOCIN) 500 mg in sodium chloride irrigation 0.9 % 60 mL ENEMA  Status:  Discontinued        500 mg Rectal 2 times daily 02/28/12 1504 02/28/12 1817   02/28/12 1200   vancomycin (VANCOCIN) 50 mg/mL oral solution 500 mg  Status:  Discontinued     Comments: First dose ASAP      500 mg Per Tube 4 times per day 02/28/12 0814 02/28/12 1817   02/28/12 1000   vancomycin (VANCOCIN) IVPB 1000 mg/200 mL premix        1,000 mg 200 mL/hr over 60 Minutes Intravenous Every 12 hours 02/28/12 0845     02/28/12 1000   vancomycin (VANCOCIN) 500 mg in sodium chloride irrigation 0.9 % 100 mL ENEMA  Status:  Discontinued        500 mg Rectal 2 times daily 02/28/12 0923 02/28/12 1504   02/27/12 1600   metroNIDAZOLE (FLAGYL) IVPB 500 mg  Status:  Discontinued        500 mg 100 mL/hr over 60 Minutes Intravenous Every 8 hours 02/27/12 1546 02/27/12 2000   02/27/12 1600   ciprofloxacin (CIPRO) IVPB 400 mg  Status:  Discontinued        400 mg 200 mL/hr over 60 Minutes Intravenous Every 12 hours 02/27/12 1547 02/27/12 2000   02/27/12 1545   metroNIDAZOLE (FLAGYL) IVPB 500 mg  Status:  Discontinued        500 mg 100 mL/hr over 60 Minutes Intravenous  Once 02/27/12 1535 02/28/12 0843   02/27/12 1545   vancomycin (VANCOCIN) 500 mg in sodium chloride 0.9 % 100 mL IVPB  Status:  Discontinued        500 mg 100 mL/hr over 60 Minutes Intravenous  Once 02/27/12 1535 02/27/12 1538   02/27/12 1545   ciprofloxacin (CIPRO) IVPB 400 mg  Status:  Discontinued        400 mg 200 mL/hr over 60 Minutes Intravenous  Once 02/27/12 1538 02/28/12 0843   02/27/12 1415   vancomycin (VANCOCIN) 500 mg in sodium chloride 0.9 % 100 mL IVPB        500 mg 100 mL/hr over 60 Minutes Intravenous  Once 02/27/12 1402 02/27/12 1411   02/27/12 1400   vancomycin  (VANCOCIN) powder 500 mg  Status:  Discontinued        500 mg Other To Surgery 02/27/12 1359 02/27/12 1402   02/27/12 1200   metroNIDAZOLE (FLAGYL) IVPB 500 mg     Comments: First dose ASAP      500 mg 100 mL/hr over 60 Minutes Intravenous Every 6 hours 02/27/12 0953     02/27/12 1200   vancomycin (VANCOCIN) 50 mg/mL oral solution 250 mg  Status:  Discontinued     Comments: First dose ASAP      250 mg Oral 4 times per day 02/27/12 0953 02/28/12 0814   02/26/12 1700   oseltamivir (TAMIFLU) capsule 75 mg        75 mg Oral  Once 02/26/12 1651 02/26/12 1715          Assessment/Plan: s/p Procedure(s) (LRB) with comments: TOTAL COLECTOMY (N/A) ILEOSTOMY (N/A) Pulmonary:  continues on vent.  CVS:  still requiring pressor medications.  Hg stable.  GI:  anticipate ileus.  Ostomy tenuous and likely represents pressor effects.  GU:  Improved urinary output.  ID:  continue abx coverage.  General:  remains in guarded condition.  LOS: 3 days    Wei Poplaski C 02/29/2012

## 2012-02-29 NOTE — Progress Notes (Signed)
eLink Physician-Brief Progress Note Patient Name: Connor Small DOB: 17-Mar-1960 MRN: [id number]  Date of Service  02/29/2012   HPI/Events of Note   Pt with progressive resp failure, sinus tachycardia, CVP 12 UOP ok  , BP soft  eICU Interventions  Start ARDS vent protocol, Start lopressor on schedule with neo drip   Intervention Category Major Interventions: Respiratory failure - evaluation and management  Asencion Noble 02/29/2012, 4:19 PM

## 2012-03-01 ENCOUNTER — Inpatient Hospital Stay (HOSPITAL_COMMUNITY): Payer: BC Managed Care – PPO

## 2012-03-01 ENCOUNTER — Encounter (HOSPITAL_COMMUNITY): Payer: Self-pay | Admitting: General Surgery

## 2012-03-01 DIAGNOSIS — J9600 Acute respiratory failure, unspecified whether with hypoxia or hypercapnia: Secondary | ICD-10-CM

## 2012-03-01 DIAGNOSIS — S31109A Unspecified open wound of abdominal wall, unspecified quadrant without penetration into peritoneal cavity, initial encounter: Secondary | ICD-10-CM

## 2012-03-01 DIAGNOSIS — A419 Sepsis, unspecified organism: Secondary | ICD-10-CM

## 2012-03-01 DIAGNOSIS — A0472 Enterocolitis due to Clostridium difficile, not specified as recurrent: Secondary | ICD-10-CM

## 2012-03-01 DIAGNOSIS — R6521 Severe sepsis with septic shock: Secondary | ICD-10-CM

## 2012-03-01 LAB — BLOOD GAS, ARTERIAL
Acid-base deficit: 1.4 mmol/L (ref 0.0–2.0)
Acid-base deficit: 2.7 mmol/L — ABNORMAL HIGH (ref 0.0–2.0)
Bicarbonate: 22 mEq/L (ref 20.0–24.0)
FIO2: 70 %
FIO2: 0.6 %
MECHVT: 440 mL
O2 Saturation: 99.2 %
O2 Saturation: 97.4 %
Patient temperature: 98.6
TCO2: 21 mmol/L (ref 0–100)
TCO2: 23.2 mmol/L (ref 0–100)
pCO2 arterial: 41.6 mmHg (ref 35.0–45.0)
pH, Arterial: 7.353 (ref 7.350–7.450)
pO2, Arterial: 112 mmHg — ABNORMAL HIGH (ref 80.0–100.0)

## 2012-03-01 LAB — COMPREHENSIVE METABOLIC PANEL
ALT: 17 U/L (ref 0–53)
AST: 38 U/L — ABNORMAL HIGH (ref 0–37)
Albumin: 1.8 g/dL — ABNORMAL LOW (ref 3.5–5.2)
CO2: 24 mEq/L (ref 19–32)
Chloride: 106 mEq/L (ref 96–112)
Creatinine, Ser: 1.38 mg/dL — ABNORMAL HIGH (ref 0.50–1.35)
GFR calc non Af Amer: 57 mL/min — ABNORMAL LOW (ref >90)
Potassium: 3.4 mEq/L — ABNORMAL LOW (ref 3.5–5.1)
Sodium: 137 mEq/L (ref 135–145)
Total Bilirubin: 0.6 mg/dL (ref 0.3–1.2)

## 2012-03-01 LAB — DIFFERENTIAL
Basophils Relative: 0 % (ref 0–1)
Eosinophils Absolute: 0 10*3/uL (ref 0.0–0.7)
Lymphs Abs: 0.8 10*3/uL (ref 0.7–4.0)
Monocytes Relative: 7 % (ref 3–12)
Neutro Abs: 29.8 10*3/uL — ABNORMAL HIGH (ref 1.7–7.7)
Neutrophils Relative %: 90 % — ABNORMAL HIGH (ref 43–77)
WBC Morphology: INCREASED

## 2012-03-01 LAB — GLUCOSE, CAPILLARY
Glucose-Capillary: 215 mg/dL — ABNORMAL HIGH (ref 70–99)
Glucose-Capillary: 217 mg/dL — ABNORMAL HIGH (ref 70–99)
Glucose-Capillary: 259 mg/dL — ABNORMAL HIGH (ref 70–99)

## 2012-03-01 LAB — CBC
MCV: 92.2 fL (ref 78.0–100.0)
Platelets: 93 10*3/uL — ABNORMAL LOW (ref 150–400)
RBC: 3.48 MIL/uL — ABNORMAL LOW (ref 4.22–5.81)
RDW: 15.4 % (ref 11.5–15.5)
WBC: 33 10*3/uL — ABNORMAL HIGH (ref 4.0–10.5)

## 2012-03-01 LAB — PROCALCITONIN: Procalcitonin: 20.7 ng/mL

## 2012-03-01 LAB — MRSA PCR SCREENING: MRSA by PCR: NEGATIVE

## 2012-03-01 LAB — POCT I-STAT 3, ART BLOOD GAS (G3+)
O2 Saturation: 97 %
TCO2: 23 mmol/L (ref 0–100)
pCO2 arterial: 39 mmHg (ref 35.0–45.0)
pO2, Arterial: 94 mmHg (ref 80.0–100.0)

## 2012-03-01 LAB — MAGNESIUM: Magnesium: 2.4 mg/dL (ref 1.5–2.5)

## 2012-03-01 LAB — TRIGLYCERIDES: Triglycerides: 101 mg/dL (ref <150)

## 2012-03-01 MED ORDER — FENTANYL CITRATE 0.05 MG/ML IJ SOLN
INTRAMUSCULAR | Status: AC
  Administered 2012-03-01: 100 ug
  Filled 2012-03-01: qty 2

## 2012-03-01 MED ORDER — FENTANYL BOLUS VIA INFUSION: 25.0000 ug | Freq: Four times a day (QID) | INTRAVENOUS | Status: DC | PRN

## 2012-03-01 MED ORDER — SODIUM CHLORIDE 0.9 % IV SOLN
1.0000 mg/h | INTRAVENOUS | Status: DC
  Administered 2012-03-01 – 2012-03-03 (×3): 2 mg/h via INTRAVENOUS
  Filled 2012-03-01 (×4): qty 10

## 2012-03-01 MED ORDER — POTASSIUM PHOSPHATE DIBASIC 3 MMOLE/ML IV SOLN
20.0000 mmol | Freq: Once | INTRAVENOUS | Status: AC
  Administered 2012-03-01: 20 mmol via INTRAVENOUS
  Filled 2012-03-01: qty 6.67

## 2012-03-01 MED ORDER — MIDAZOLAM HCL 2 MG/2ML IJ SOLN
INTRAMUSCULAR | Status: AC
  Filled 2012-03-01: qty 2

## 2012-03-01 MED ORDER — VANCOMYCIN HCL 10 G IV SOLR
1250.0000 mg | Freq: Two times a day (BID) | INTRAVENOUS | Status: DC
  Administered 2012-03-01 – 2012-03-02 (×2): 1250 mg via INTRAVENOUS
  Filled 2012-03-01 (×4): qty 1250

## 2012-03-01 MED ORDER — PHENYLEPHRINE HCL 10 MG/ML IJ SOLN
INTRAMUSCULAR | Status: AC
  Filled 2012-03-01: qty 4

## 2012-03-01 MED ORDER — MIDAZOLAM BOLUS VIA INFUSION
1.0000 mg | INTRAVENOUS | Status: DC | PRN
  Filled 2012-03-01: qty 2

## 2012-03-01 MED ORDER — AMIODARONE HCL IN DEXTROSE 360-4.14 MG/200ML-% IV SOLN
30.0000 mg/h | INTRAVENOUS | Status: DC
  Administered 2012-03-01 – 2012-03-06 (×9): 30 mg/h via INTRAVENOUS
  Filled 2012-03-01 (×26): qty 200

## 2012-03-01 MED ORDER — SODIUM CHLORIDE 0.9 % IV SOLN
25.0000 ug/h | INTRAVENOUS | Status: DC
  Administered 2012-03-01: 100 ug/h via INTRAVENOUS
  Administered 2012-03-02: 75 ug/h via INTRAVENOUS
  Administered 2012-03-02: 50 ug/h via INTRAVENOUS
  Administered 2012-03-02: 75 ug/h via INTRAVENOUS
  Administered 2012-03-04: 25 ug/h via INTRAVENOUS
  Filled 2012-03-01 (×3): qty 50

## 2012-03-01 MED ORDER — DEXTROSE 5 % IV SOLN
1.0000 g | Freq: Three times a day (TID) | INTRAVENOUS | Status: DC
  Administered 2012-03-01 – 2012-03-02 (×3): 1 g via INTRAVENOUS
  Filled 2012-03-01 (×5): qty 1

## 2012-03-01 MED ORDER — VANCOMYCIN 50 MG/ML ORAL SOLUTION
500.0000 mg | Freq: Three times a day (TID) | ORAL | Status: DC
  Administered 2012-03-01 – 2012-03-02 (×3): 500 mg via ORAL
  Filled 2012-03-01 (×6): qty 10

## 2012-03-01 MED ORDER — ACETAMINOPHEN 10 MG/ML IV SOLN
1000.0000 mg | Freq: Once | INTRAVENOUS | Status: AC
  Administered 2012-03-01: 1000 mg via INTRAVENOUS
  Filled 2012-03-01: qty 100

## 2012-03-01 MED ORDER — KCL IN DEXTROSE-NACL 10-5-0.45 MEQ/L-%-% IV SOLN
INTRAVENOUS | Status: DC
  Filled 2012-03-01: qty 1000

## 2012-03-01 MED ORDER — ACETAMINOPHEN 10 MG/ML IV SOLN
INTRAVENOUS | Status: AC
  Filled 2012-03-01: qty 100

## 2012-03-01 MED ORDER — SODIUM CHLORIDE 0.9 % IV SOLN
INTRAVENOUS | Status: AC
  Administered 2012-03-01: 14:00:00 via INTRAVENOUS

## 2012-03-01 MED ORDER — BIOTENE DRY MOUTH MT LIQD: 15.0000 mL | Freq: Four times a day (QID) | OROMUCOSAL | Status: DC

## 2012-03-01 MED ORDER — AMIODARONE HCL IN DEXTROSE 360-4.14 MG/200ML-% IV SOLN
60.0000 mg/h | INTRAVENOUS | Status: DC
  Administered 2012-03-01 (×2): 60 mg/h via INTRAVENOUS
  Filled 2012-03-01: qty 200

## 2012-03-01 MED ORDER — KCL IN DEXTROSE-NACL 20-5-0.45 MEQ/L-%-% IV SOLN
INTRAVENOUS | Status: DC
  Administered 2012-03-01: 08:00:00 via INTRAVENOUS
  Filled 2012-03-01: qty 1000

## 2012-03-01 MED ORDER — SODIUM CHLORIDE 0.9 % IV SOLN: 25.0000 ug/h | INTRAVENOUS | Status: DC

## 2012-03-01 MED ORDER — AMIODARONE LOAD VIA INFUSION
150.0000 mg | Freq: Once | INTRAVENOUS | Status: DC
  Filled 2012-03-01: qty 83.34

## 2012-03-01 MED ORDER — SODIUM CHLORIDE 0.9 % IV SOLN
INTRAVENOUS | Status: DC
  Administered 2012-03-01 – 2012-03-26 (×9): via INTRAVENOUS

## 2012-03-01 MED ORDER — INSULIN REGULAR HUMAN 100 UNIT/ML IJ SOLN
INTRAVENOUS | Status: AC
  Administered 2012-03-01 – 2012-03-02 (×2): via INTRAVENOUS
  Filled 2012-03-01: qty 3000

## 2012-03-01 MED ORDER — CHLORHEXIDINE GLUCONATE 0.12 % MT SOLN: 15.0000 mL | Freq: Two times a day (BID) | OROMUCOSAL | Status: DC

## 2012-03-01 MED ORDER — AMIODARONE LOAD VIA INFUSION
150.0000 mg | Freq: Once | INTRAVENOUS | Status: AC
  Administered 2012-03-01: 150 mg via INTRAVENOUS
  Filled 2012-03-01: qty 83.34

## 2012-03-01 MED ORDER — AMIODARONE IV BOLUS ONLY 150 MG/100ML: 150.0000 mg | Freq: Once | INTRAVENOUS | Status: DC

## 2012-03-01 MED ORDER — HYDROCORTISONE SOD SUCCINATE 100 MG IJ SOLR
50.0000 mg | Freq: Four times a day (QID) | INTRAMUSCULAR | Status: DC
  Administered 2012-03-01 – 2012-03-03 (×7): 50 mg via INTRAVENOUS
  Filled 2012-03-01 (×11): qty 1

## 2012-03-01 NOTE — Progress Notes (Signed)
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 Per hospital policy blood drawn for exposure panel.  Connor Small

## 2012-03-01 NOTE — Progress Notes (Signed)
[facility] for Vancomycin Indication: Cdiff , sepsis  Allergies  Allergen Reactions  . Penicillins Other (See Comments)    Heart rate changes   Patient Measurements: Height: 5' 9"  (175.3 cm) Weight: 247 lb 5.7 oz (112.2 kg) IBW/kg (Calculated) : 70.7   Vital Signs: Temp: 100.2 F (37.9 C) (02/06 0500) Temp src: Rectal (02/06 0500) Pulse Rate: 90  (02/06 1000) Intake/Output from previous day: 02/05 0701 - 02/06 0700 In: 6606.4 [I.V.:4031.4; IV Piggyback:1660; TPN:765] Out: 1950 [Urine:1500; Emesis/NG output:50; Drains:180; Stool:220] Intake/Output from this shift: Total I/O In: 637.1 [I.V.:457.1; TPN:180] Out: 60 [Drains:60]  Labs:  [facility] 03/01/12 0514 02/29/12 0439 02/28/12 2352 02/28/12 1924  WBC 33.0* 14.2* -- 3.9*  HGB 11.3* 14.5 -- 13.7  PLT 93* 113* -- 124*  LABCREA -- -- -- --  CREATININE 1.38* 1.78* 1.84* --   Estimated Creatinine Clearance: 76.4 ml/min (by C-G formula based on Cr of 1.38).  Basename 03/01/12 0827  VANCOTROUGH 10.6  VANCOPEAK --  VANCORANDOM --  GENTTROUGH --  GENTPEAK --  GENTRANDOM --  TOBRATROUGH --  TOBRAPEAK --  TOBRARND --  AMIKACINPEAK --  AMIKACINTROU --  AMIKACIN --    Microbiology: Recent Results (from the past 720 hour(s))  CULTURE, BLOOD (ROUTINE X 2)     Status: Normal (Preliminary result)   Collection Time   02/27/12  4:58 AM      Component Value Range Status Comment   Specimen Description BLOOD RIGHT ARM   Final    Special Requests BOTTLES DRAWN AEROBIC AND ANAEROBIC 6CC   Final    Culture NO GROWTH 3 DAYS   Final    Report Status PENDING   Incomplete   CULTURE, BLOOD (ROUTINE X 2)     Status: Normal (Preliminary result)   Collection Time   02/27/12  5:15 AM      Component Value Range Status Comment   Specimen Description BLOOD RIGHT HAND   Final    Special Requests BOTTLES DRAWN AEROBIC AND ANAEROBIC 6CC   Final    Culture NO GROWTH 3 DAYS   Final    Report Status PENDING    Incomplete   SURGICAL PCR SCREEN     Status: Normal   Collection Time   02/27/12  3:15 PM      Component Value Range Status Comment   MRSA, PCR NEGATIVE  NEGATIVE Final    Staphylococcus aureus NEGATIVE  NEGATIVE Final   URINE CULTURE     Status: Normal   Collection Time   02/28/12 10:03 AM      Component Value Range Status Comment   Specimen Description URINE, CATHETERIZED   Final    Special Requests NONE   Final    Culture  Setup Time 02/28/2012 19:00   Final    Colony Count NO GROWTH   Final    Culture NO GROWTH   Final    Report Status 02/29/2012 FINAL   Final   CULTURE, BLOOD (ROUTINE X 2)     Status: Normal (Preliminary result)   Collection Time   02/28/12 12:43 PM      Component Value Range Status Comment   Specimen Description BLOOD LEFT ARM   Final    Special Requests BOTTLES DRAWN AEROBIC AND ANAEROBIC 8CC   Final    Culture NO GROWTH 2 DAYS   Final    Report Status PENDING   Incomplete   CULTURE, BLOOD (ROUTINE X 2)     Status: Normal (  Preliminary result)   Collection Time   02/28/12  1:39 PM      Component Value Range Status Comment   Specimen Description BLOOD LEFT ANTECUBITAL   Final    Special Requests BOTTLES DRAWN AEROBIC AND ANAEROBIC [place]   Final    Culture NO GROWTH 2 DAYS   Final    Report Status PENDING   Incomplete    Medical History: Past Medical History  Diagnosis Date  . Chronic diarrhea   . Rectal bleed   . Hemorrhoids   . Ulcerative colitis     Distal UC over 8 yrs ago diagnosed  . Diverticulitis of large intestine with perforation 10/2011    done at [facility]   Medications:  Scheduled:     . [COMPLETED] acetaminophen  1,000 mg Intravenous Once  . albumin human  25 g Intravenous Q6H  . amiodarone  150 mg Intravenous Once  . antiseptic oral rinse  15 mL Mouth Rinse QID  . chlorhexidine  15 mL Mouth Rinse BID  . hydrocortisone sodium succinate  100 mg Intravenous Q6H  . insulin aspart  0-20 Units Subcutaneous Q4H  . ipratropium  0.5 mg  Nebulization Q6H  . levalbuterol  0.63 mg Nebulization Q6H  . metoprolol  5 mg Intravenous Q6H  . metronidazole  500 mg Intravenous Q6H  . pantoprazole (PROTONIX) IV  40 mg Intravenous Daily  . [COMPLETED] sodium chloride  500 mL Intravenous Once  . sodium chloride  10-40 mL Intracatheter Q12H  . sodium chloride  3 mL Intravenous Q12H  . vancomycin  1,000 mg Intravenous Q12H  . [DISCONTINUED] ipratropium  0.5 mg Nebulization Q4H  . [DISCONTINUED] levalbuterol  0.63 mg Nebulization Q4H   Assessment: 52 yo M with severe Cdiff colitis/?sepsis.  Per ID recommendations IV Vancomycin was started on 2/4.  Renal fxn seems to have stabilized.  Estimated Creatinine Clearance: 76.4 ml/min (by C-G formula based on Cr of 1.38).  Leukocytosis is worsening.  Pt is on steroids.  Trough level is below goal.  SCr is improving.  Tm reportedly 100.3   Goal of Therapy:  Vancomycin trough level 15-20 mcg/ml  Plan:  1) increase Vancomycin to 1219m IV Q12h 2) Re-Check Vancomycin trough when appropriate 3) Monitor renal function closely as SCr is improving 4) f/u culture data when available  HHart RobinsonsA 03/01/2012,11:20 AM

## 2012-03-01 NOTE — Progress Notes (Signed)
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 Penicillins Other (See Comments)    Heart rate changes    Patient Measurements: Height: 5' 9"  (175.3 cm) Weight: 247 lb 5.7 oz (112.2 kg) IBW/kg (Calculated) : 70.7   Vital Signs: Temp: 100.2 F (37.9 C) (02/06 0500) Temp src: Rectal (02/06 0500) BP: 91/58 mmHg (02/06 1200) Pulse Rate: 119  (02/06 1230) Intake/Output from previous day: 02/05 0701 - 02/06 0700 In: 6606.4 [I.V.:4031.4; IV Piggyback:1660; TPN:765] Out: 1950 [Urine:1500; Emesis/NG output:50; Drains:180; Stool:220] Intake/Output from this shift: Total I/O In: 1073.7 [I.V.:773.7; TPN:300] Out: 60 [Drains:60]  Labs:  [facility] 03/01/12 0827 03/01/12 0514 02/29/12 0439 02/28/12 1924  WBC -- 33.0* 14.2* 3.9*  HGB -- 11.3* 14.5 13.7  HCT -- 32.1* 41.7 40.7  PLT -- 93* 113* 124*  APTT 36 -- -- --  INR 1.56* -- -- --     Basename 03/01/12 0514 03/01/12 0509 02/29/12 0439 02/28/12 2352 02/28/12 0433  NA 137 -- 136 134* --  K 3.4* -- 3.6 3.9 --  CL 106 -- 111 112 --  CO2 24 -- 15* 15* --  GLUCOSE 215* -- 165* 188* --  BUN 40* -- 39* 39* --  CREATININE 1.38* -- 1.78* 1.84* --  LABCREA -- -- -- -- --  CREAT24HRUR -- -- -- -- --  CALCIUM 6.6* -- 6.3* 5.9* --  MG 2.4 -- -- -- --  PHOS 1.7* -- -- 2.2* --  PROT 3.8* -- 3.0* 2.7* --  ALBUMIN 1.8* -- 1.0* 1.0* --  AST 38* -- 41* 32 --  ALT 17 -- 16 12 --  ALKPHOS 54 -- 37* 19* --  BILITOT 0.6 -- 0.3 0.4 --  BILIDIR -- -- -- -- 0.4*  IBILI -- -- -- -- 0.3  PREALBUMIN -- -- -- -- --  TRIG -- 101 -- -- --  CHOLHDL -- -- -- -- --  CHOL -- 39 -- -- --   Estimated Creatinine Clearance: 76.4 ml/min (by C-G formula based on Cr of 1.38).    Basename 03/01/12 0740 03/01/12 0451 03/01/12 0012  GLUCAP 197* 215* 217*   Insulin Requirements in the past 24 hours:  21 units SSI  since TPN started last night  Current Nutrition:  Clinimix E5/15 at 62m/hr  Assessment: 89yom with history of ulcerative colitis presented to the hospital with back pain, fever and diarrhea. 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 TPN was initiated last night for nutrition support. Transferred to [facility] 2/6.   GI: Hx ulcerative colitis - previously in remission with mesalamine - developed toxic megacolon d/t cdiff colitis. Failed medical management, now POD#2 total colectomy. Started on TPN 2/5. Endo: No noted hx DM - CBGs elevated, also on hydrocortisone. Hyperglycemia likely related to multiple factors including TPN, dextrose containing fluids, critical illness and steroid administration.  Lytes: Na 137, K 3.4, Phos 1.7, Mg 2.4 >> no repletion prior to transfer (has D51/2NS with Kcl running at 560mhr) Renal: AKI - Scr now improving slightly, down to 1.38 today, UOP 0.6  Pulm: Intubated 60% Fio2 Cards: BP 102/62, HR 119 - amio gtt, phenylephrine gtt Hepatobil: LFTs WNL, trigs 101, prealbumin pending Neuro: Sedated, GCS 11, RASS -2 (goal 0) - prn pain/sedation ID: Cdiff colitis, sepsis - flagyl, vanc - tmax 100.3, WBC 33 Best Practices:  SCDs, PPI IV, MC TPN Access: PICC TPN day#: 1  Nutritional Goals:  2000-2200 kCal, 130-140 grams of protein per day, 2.1-2.3L/day  Plan:  1. Increase clinimix E5/15 to 156m/hr (provides 120gm/day >>92% goal and an average of 1909kcal/day >>95% goal). Will try to increase further tomorrow depending on how he is able to tolerate the volume and dextrose in order to meet protein and calorie goals.    2. Lipids, MVI, TE on MWF only d/t national shortage 3. Add 10 units insulin to the TPN  4. Supplement KPhos 210ml IV x 1 5. F/u AM BMET, Mg, Phos 6. Change IVF to NS now at current MIVF rate then [facility] 7. F/u further RD  recommendations  Nethra Mehlberg, RaRande Lawman/06/2012,12:41 PM

## 2012-03-01 NOTE — Procedures (Signed)
Central Venous Catheter Insertion Procedure Note Connor Small [id number] February 13, 1960  Procedure: Insertion of Central Venous Catheter Indications: Assessment of intravascular volume, Drug and/or fluid administration and Frequent blood sampling  Procedure Details Consent: Risks of procedure as well as the alternatives and risks of each were explained to the (patient/caregiver).  Consent for procedure obtained. Time Out: Verified patient identification, verified procedure, site/side was marked, verified correct patient position, special equipment/implants available, medications/allergies/relevent history reviewed, required imaging and test results available.  Performed Real time [place] used to identify and cannulate the vessel.  Maximum sterile technique was used including antiseptics, cap, gloves, gown, hand hygiene, mask and sheet. Skin prep: Chlorhexidine; local anesthetic administered A antimicrobial bonded/coated triple lumen catheter was placed in the left internal jugular vein using the Seldinger technique.  Evaluation Blood flow good Complications: No apparent complications Patient did tolerate procedure well. Chest X-ray ordered to verify placement.  CXR: pending.  BABCOCK,PETE 03/01/2012, 4:01 PM   Patient seen and examined, agree with above note.  I dictated the care and orders written for this patient under my direction.  Rush Farmer, MD [phone]

## 2012-03-01 NOTE — Progress Notes (Signed)
Subjective: This man is deteriorating clinically. He, overnight, went into atrial fibrillation. His blood pressure is still somewhat soft although he does have a good urine output. His renal function has improved but his chest x-ray is worsening.           Physical Exam: Blood pressure 101/72, pulse 109, temperature 100.2 F (37.9 C), temperature source Rectal, resp. rate 25, height 5' 9"  (1.753 m), weight 112.2 kg (247 lb 5.7 oz), SpO2 97.00%. Peripheries are cool now. Sedated. Lung fields anteriorly are clear. Abdomen difficult to assess as he is on sedative medications that appears to be soft. Bowel sounds not heard. Peripheral pitting edema.   Investigations:  Recent Results (from the past 240 hour(s))  CULTURE, BLOOD (ROUTINE X 2)     Status: Normal (Preliminary result)   Collection Time   02/27/12  4:58 AM      Component Value Range Status Comment   Specimen Description BLOOD RIGHT ARM   Final    Special Requests BOTTLES DRAWN AEROBIC AND ANAEROBIC 6CC   Final    Culture NO GROWTH 2 DAYS   Final    Report Status PENDING   Incomplete   CULTURE, BLOOD (ROUTINE X 2)     Status: Normal (Preliminary result)   Collection Time   02/27/12  5:15 AM      Component Value Range Status Comment   Specimen Description BLOOD RIGHT HAND   Final    Special Requests BOTTLES DRAWN AEROBIC AND ANAEROBIC 6CC   Final    Culture NO GROWTH 2 DAYS   Final    Report Status PENDING   Incomplete   SURGICAL PCR SCREEN     Status: Normal   Collection Time   02/27/12  3:15 PM      Component Value Range Status Comment   MRSA, PCR NEGATIVE  NEGATIVE Final    Staphylococcus aureus NEGATIVE  NEGATIVE Final   URINE CULTURE     Status: Normal   Collection Time   02/28/12 10:03 AM      Component Value Range Status Comment   Specimen Description URINE, CATHETERIZED   Final    Special Requests NONE   Final    Culture  Setup Time 02/28/2012 19:00   Final    Colony Count NO GROWTH   Final    Culture NO  GROWTH   Final    Report Status 02/29/2012 FINAL   Final   CULTURE, BLOOD (ROUTINE X 2)     Status: Normal (Preliminary result)   Collection Time   02/28/12 12:43 PM      Component Value Range Status Comment   Specimen Description BLOOD LEFT ARM   Final    Special Requests BOTTLES DRAWN AEROBIC AND ANAEROBIC 8CC   Final    Culture NO GROWTH 1 DAY   Final    Report Status PENDING   Incomplete   CULTURE, BLOOD (ROUTINE X 2)     Status: Normal (Preliminary result)   Collection Time   02/28/12  1:39 PM      Component Value Range Status Comment   Specimen Description BLOOD LEFT ANTECUBITAL   Final    Special Requests BOTTLES DRAWN AEROBIC AND ANAEROBIC [place]   Final    Culture NO GROWTH 1 DAY   Final    Report Status PENDING   Incomplete      Basic Metabolic Panel:  Basename 03/01/12 0514 02/29/12 0439 02/28/12 2352  NA 137 136 --  K  3.4* 3.6 --  CL 106 111 --  CO2 24 15* --  GLUCOSE 215* 165* --  BUN 40* 39* --  CREATININE 1.38* 1.78* --  CALCIUM 6.6* 6.3* --  MG 2.4 -- --  PHOS 1.7* -- 2.2*   Liver Function Tests:  [facility] 03/01/12 0514 02/29/12 0439  AST 38* 41*  ALT 17 16  ALKPHOS 54 37*  BILITOT 0.6 0.3  PROT 3.8* 3.0*  ALBUMIN 1.8* 1.0*     CBC:  Basename 03/01/12 0514 02/29/12 0439  WBC 33.0* 14.2*  NEUTROABS 29.8* 12.7*  HGB 11.3* 14.5  HCT 32.1* 41.7  MCV 92.2 93.1  PLT 93* 113*    Dg Chest Port 1 View  03/01/2012  *RADIOLOGY REPORT*  Clinical Data: Worsening respiratory failure.  PORTABLE CHEST - 1 VIEW  Comparison: 02/29/2012  Findings: Endotracheal tube tip measures 4.4 cm above the carina. Enteric tube tip remains in place with tip below the field of view of the image.  Right PICC catheter is unchanged in position with tip over the cavoatrial junction.  Heart size and pulmonary vascularity demonstrate mild increased.  There is increasing opacification of both mid and lower lungs, greatest on the left. This is likely due to combination of increasing pleural  effusions and consolidation in the lungs.  No pneumothorax.  IMPRESSION: Increasing opacification in the lungs likely representing increasing pleural effusions and atelectasis or consolidation in the lungs.   Original Report Authenticated By: Lucienne Capers, M.D.    Dg Chest Port 1 View  02/29/2012  *RADIOLOGY REPORT*  Clinical Data: Respiratory failure  PORTABLE CHEST - 1 VIEW  Comparison: 02/28/2012  Findings: Endotracheal tube tip is above the carina.  There is a right arm PICC line with tip in the cavoatrial junction. Nasogastric tube tip is below the GE junction within the stomach. There are decreased lung volumes.  Bilateral, posterior layering effusions and patchy areas of atelectasis are noted.  Compared with previous exam there appears to be slight increase in interstitial edema.  IMPRESSION:  1.  Stable support apparatus.  The nasogastric tube has been repositioned and is now situated with tip below the GE junction. 2.  Increase in edema.   Original Report Authenticated By: Kerby Moors, M.D.    Portable Chest Xray  02/28/2012  *RADIOLOGY REPORT*  Clinical Data: Endotracheal tube placement.  PORTABLE CHEST - 1 VIEW  Comparison: 02/28/2012  Findings: Endotracheal tube has been placed, tip approximately 2.9 cm above carina.  Nasogastric tube is not coiled back upon itself, tip redirected cephalad at the level of the mid esophagus.  A right- sided PICC line tip overlies the level of the superior vena cava confluence with the right atrium.  Film is very shallow lung inflation.  There is dense consolidation at the left lung base, a new finding.  There are changes of mild pulmonary edema. Bilateral pleural effusions are suspected.  IMPRESSION: 1.  Endotracheal tube placed as described. 2.  Nasogastric tube is coiled. 3.  Interval development of left lower lobe atelectasis or consolidation. 3.  Bilateral pleural effusions.   Original Report Authenticated By: Nolon Nations, M.D.    Dg Chest Port 1  View  02/28/2012  *RADIOLOGY REPORT*  Clinical Data: Confirm line placement  PORTABLE CHEST - 1 VIEW  Comparison: Plain film 02/27/2012  Findings: There is an NG tube with tip at the gastric GE junction. Interval placement of a right PICC line with tip in the distal SVC. Stable cardiac silhouette.  There are low  lung volumes and basilar atelectasis.  No pneumothorax.  IMPRESSION:  1.  NG tube with tip at the GE junction. 2.  New right PICC line with tip in distal SVC. 3.  Bibasilar atelectasis and low lung volumes.   Original Report Authenticated By: Suzy Bouchard, M.D.       Medications: I have reviewed the patient's current medications.  Impression: 1. 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 2. Acute renal failure, probable ATN and also sepsis contributing, improving 3. Septic shock, secondary to #1 requiring vasopressor support. 4. History of ulcer colitis. On intravenous steroids. 5. Worsening respiratory status with worsening chest x-ray.     Plan: 1. Critically sick patient requiring vasopressor support, on ventilator, improving HTN but significant third spacing and worsening chest x-ray. I think this patient would be better served at [facility] in the intensive care unit. Have spoken with the intensivist,Dr. Nelda Marseille, there and he agrees for transfer.  I have updated the patient's wife and daughter at the bedside and they understand the severity of the patient's illness and the possibility of deterioration.     LOS: 4 days   Doree Albee Pager [phone]  03/01/2012, 7:48 AM

## 2012-03-01 NOTE — Progress Notes (Signed)
Note reviewed. Agree with plan.  #[phone]

## 2012-03-01 NOTE — Progress Notes (Signed)
I discussed his situation with Dr. Anastasio Champion hospitalist attending and he is requesting that Mr. Mcgue be transferred to [facility]. I think that's appropriate. He has gone into atrial fibrillation. Chest x-ray continues to show problems although some of his low lung volumes because of the change to the [facility] protocol. I discussed all this with his family

## 2012-03-01 NOTE — Consult Note (Signed)
Connor Small 1960-04-29  [id number].   Primary Care MD: Dr. Larita Fife Requesting MD: Dr. Koren Bound Chief Complaint/Reason for Consult: post operative management HPI: This is a 52 yo male who presented to [facility] several days ago with diarrhea and flu-like symptoms.  He was initially treated with tamiflu.  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  He had a cecostomy tube placed by Dr. Suzette Battiest on 02-27-12.  This initially was helpful; however, in less than 24 hours he began worsening again.  He was then noted to have C. Diff colitis with toxic megacolon.  He was taken back to the operating room on 02-28-12 for a total abdominal colectomy.  It appears that the rectum was possibly devascularized during the procedure.  The rectal stump was suture with 3-0 Prolene.  He was given an ileostomy which was "purple" already on POD#1.  The patient remained in VDRF and requiring pressor support with new onset A. Fib.  He was transferred to [facility] for further care.  We have been asked to see him for further post operative management.  Of note, his WBC have increased from 14K to 33K today.  Review of systems: unable to obtain as patient is sedated on vent.  Family History  Problem Relation Age of Onset  . Cancer Sister   . Healthy Daughter   . Hypertension Mother     Past Medical History  Diagnosis Date  . Chronic diarrhea   . Rectal bleed   . Hemorrhoids   . Ulcerative colitis     Distal UC over 8 yrs ago diagnosed  . Diverticulitis of large intestine with perforation 10/2011    done at [facility]    Past Surgical History  Procedure Date  . Colonoscopy 9/03  . Sigmoidoscopy      06/13/2002  . Temporary ostomy November 06, 2010     for a colon perforation that was done in [facility] (Dr Ruben Im).    . Colonoscopy 02/16/2011    Procedure: COLONOSCOPY;  Surgeon: Malissa Hippo, MD;  Location: [facility];  Service:  Endoscopy;  Laterality: N/A;  100  . Flexible sigmoidoscopy 02/27/2012    Procedure: FLEXIBLE SIGMOIDOSCOPY;  Surgeon: Malissa Hippo, MD;  Location: [facility];  Service: Endoscopy;  Laterality: N/A;  with colonic decompression  . Laparotomy 02/27/2012    Procedure: EXPLORATORY LAPAROTOMY;  Surgeon: Fabio Bering, MD;  Location: [facility] ORS;  Service: General;  Laterality: N/A;  . Cecostomy 02/27/2012    Procedure: CECOSTOMY;  Surgeon: Fabio Bering, MD;  Location: [facility];  Service: General;  Laterality: N/A;  Cecostomy Tube Placement  . Colectomy 02/28/2012    Procedure: TOTAL COLECTOMY;  Surgeon: Fabio Bering, MD;  Location: [facility];  Service: General;  Laterality: N/A;  . Ileostomy 02/28/2012    Procedure: ILEOSTOMY;  Surgeon: Fabio Bering, MD;  Location: [facility];  Service: General;  Laterality: N/A;    Social History:  reports that he quit smoking about 27 years ago. His smoking use included Cigarettes. He has never used smokeless tobacco. He reports that he does not drink alcohol or use illicit drugs.  Allergies:  Allergies  Allergen Reactions  . Penicillins Other (See Comments)    Heart rate changes    Medications Prior to Admission  Medication Sig Dispense Refill  . bifidobacterium infantis (ALIGN) capsule Take 1 capsule by mouth daily.  14 capsule  0  . FORTESTA 10 MG/ACT (2%) GEL at bedtime. Patient states that he applies it to his leg      . ibuprofen (ADVIL,MOTRIN) 200 MG tablet Take 400 mg by mouth 2 (two) times daily as needed. For pain      . Mesalamine (ASACOL HD) 800 MG TBEC Take 2 tablets (1,600 mg total) by mouth 2 (two) times daily.  180 tablet  3  . Pseudoeph-Doxylamine-DM-APAP (NYQUIL D COLD/FLU) 60-12.06-22-998 MG/30ML LIQD Take 30 mLs by mouth at bedtime. Cold/flu symptoms      . Pseudoephedrine-APAP-DM (DAYQUIL MULTI-SYMPTOM COLD/FLU PO) Take 1 capsule by mouth every 4 (four) hours as needed. Cold symptoms        Blood pressure 91/58, pulse 119, temperature 97.5  F (36.4 C), temperature source Oral, resp. rate 16, height 5\' 9"  (1.753 m), weight 247 lb 5.7 oz (112.2 kg), SpO2 97.00%. Physical Exam: General: Sedate obese white male on ventilator HEENT: head is normocephalic, atraumatic.  Sclera are noninjected.  PERRL.  Ears and nose without any masses or lesions.  NGT in right nare.  ETT in place Heart: irregularly irregular.  Normal s1,s2. No obvious murmurs, gallops, or rubs noted.  Palpable radial and pedal pulses bilaterally Lungs: CTAB,wtih decreased BS, on ventilator Abd: soft, but distended, -BS, unable to assess tenderness.  Stoma is necrotic, but test tube test reveals viable stoma in subcutaneous area.  Wound is intact with staples, no evidence of infection at this point.  2 JP drains in place with serous output only. MS: all 4 extremities are symmetrical with diffuse anasarca Skin: warm and dry with no masses, lesions, or rashes Psych: unable to assess due to ventilator    Results for orders placed during the hospital encounter of 02/26/12 (from the past 48 hour(s))  GLUCOSE, CAPILLARY     Status: Abnormal   Collection Time   02/28/12  6:49 PM      Component Value Range Comment   Glucose-Capillary 49 (*) 70 - 99 mg/dL    Comment 1 Notify RN     GLUCOSE, CAPILLARY     Status: Normal   Collection Time   02/28/12  6:54 PM      Component Value Range Comment   Glucose-Capillary 98  70 - 99 mg/dL   CBC     Status: Abnormal   Collection Time   02/28/12  7:24 PM      Component Value Range Comment   WBC 3.9 (*) 4.0 - 10.5 K/uL    RBC 4.34  4.22 - 5.81 MIL/uL    Hemoglobin 13.7  13.0 - 17.0 g/dL DELTA CHECK NOTED   HCT 40.7  39.0 - 52.0 %    MCV 93.8  78.0 - 100.0 fL    MCH 31.6  26.0 - 34.0 pg    MCHC 33.7  30.0 - 36.0 g/dL    RDW 16.1  09.6 - 04.5 %    Platelets 124 (*) 150 - 400 K/uL DELTA CHECK NOTED  COMPREHENSIVE METABOLIC PANEL     Status: Abnormal   Collection Time   02/28/12  7:24 PM      Component Value Range Comment   Sodium 138   135 - 145 mEq/L    Potassium 5.0  3.5 - 5.1 mEq/L    Chloride 117 (*) 96 - 112 mEq/L DELTA CHECK NOTED   CO2 15 (*) 19 - 32 mEq/L    Glucose, Bld 117 (*) 70 - 99 mg/dL    BUN 37 (*)  6 - 23 mg/dL    Creatinine, Ser 5.95 (*) 0.50 - 1.35 mg/dL    Calcium 5.7 (*) 8.4 - 10.5 mg/dL    Total Protein 2.3 (*) 6.0 - 8.3 g/dL    Albumin 0.7 (*) 3.5 - 5.2 g/dL    AST 24  0 - 37 U/L    ALT 10  0 - 53 U/L    Alkaline Phosphatase 19 (*) 39 - 117 U/L    Total Bilirubin 0.2 (*) 0.3 - 1.2 mg/dL    GFR calc non Af Amer 39 (*) >90 mL/min    GFR calc Af Amer 45 (*) >90 mL/min   LACTIC ACID, PLASMA     Status: Abnormal   Collection Time   02/28/12  7:24 PM      Component Value Range Comment   Lactic Acid, Venous 3.6 (*) 0.5 - 2.2 mmol/L   PROCALCITONIN     Status: Normal   Collection Time   02/28/12  7:24 PM      Component Value Range Comment   Procalcitonin 21.86     GLUCOSE, CAPILLARY     Status: Normal   Collection Time   02/28/12  8:02 PM      Component Value Range Comment   Glucose-Capillary 82  70 - 99 mg/dL    Comment 1 Notify RN     BLOOD GAS, ARTERIAL     Status: Abnormal   Collection Time   02/28/12  8:10 PM      Component Value Range Comment   FIO2 70.00      Delivery systems VENTILATOR      Mode PRESSURE REGULATED VOLUME CONTROL      VT 500      Rate 16.0      Peep/cpap 5.0      pH, Arterial 7.146 (*) 7.350 - 7.450    pCO2 arterial 36.3  35.0 - 45.0 mmHg    pO2, Arterial 57.5 (*) 80.0 - 100.0 mmHg    Bicarbonate 12.0 (*) 20.0 - 24.0 mEq/L    TCO2 11.0  0 - 100 mmol/L    Acid-base deficit 15.1 (*) 0.0 - 2.0 mmol/L    O2 Saturation 87.8      Patient temperature 37.0      Collection site RIGHT RADIAL      Drawn by [id number]      Sample type ARTERIAL      Allens test (pass/fail) PASS  PASS   PHOSPHORUS     Status: Abnormal   Collection Time   02/28/12 11:52 PM      Component Value Range Comment   Phosphorus 2.2 (*) 2.3 - 4.6 mg/dL   COMPREHENSIVE METABOLIC PANEL     Status: Abnormal    Collection Time   02/28/12 11:52 PM      Component Value Range Comment   Sodium 134 (*) 135 - 145 mEq/L    Potassium 3.9  3.5 - 5.1 mEq/L DELTA CHECK NOTED   Chloride 112  96 - 112 mEq/L    CO2 15 (*) 19 - 32 mEq/L    Glucose, Bld 188 (*) 70 - 99 mg/dL    BUN 39 (*) 6 - 23 mg/dL    Creatinine, Ser 6.38 (*) 0.50 - 1.35 mg/dL    Calcium 5.9 (*) 8.4 - 10.5 mg/dL    Total Protein 2.7 (*) 6.0 - 8.3 g/dL    Albumin 1.0 (*) 3.5 - 5.2 g/dL    AST 32  0 - 37  U/L    ALT 12  0 - 53 U/L    Alkaline Phosphatase 19 (*) 39 - 117 U/L    Total Bilirubin 0.4  0.3 - 1.2 mg/dL    GFR calc non Af Amer 40 (*) >90 mL/min    GFR calc Af Amer 47 (*) >90 mL/min   GLUCOSE, CAPILLARY     Status: Abnormal   Collection Time   02/29/12 12:07 AM      Component Value Range Comment   Glucose-Capillary 144 (*) 70 - 99 mg/dL    Comment 1 Notify RN     CARBOXYHEMOGLOBIN     Status: Normal   Collection Time   02/29/12  4:00 AM      Component Value Range Comment   Total hemoglobin 14.3  13.5 - 18.0 g/dL    O2 Saturation 16.1      Carboxyhemoglobin 0.9  0.5 - 1.5 %    Methemoglobin 0.7  0.0 - 1.5 %    Total oxygen content 15.5  15.0 - 23.0 mL/dL   GLUCOSE, CAPILLARY     Status: Abnormal   Collection Time   02/29/12  4:00 AM      Component Value Range Comment   Glucose-Capillary 139 (*) 70 - 99 mg/dL    Comment 1 Notify RN     CBC WITH DIFFERENTIAL     Status: Abnormal   Collection Time   02/29/12  4:39 AM      Component Value Range Comment   WBC 14.2 (*) 4.0 - 10.5 K/uL    RBC 4.48  4.22 - 5.81 MIL/uL    Hemoglobin 14.5  13.0 - 17.0 g/dL    HCT 09.6  04.5 - 40.9 %    MCV 93.1  78.0 - 100.0 fL    MCH 32.4  26.0 - 34.0 pg    MCHC 34.8  30.0 - 36.0 g/dL    RDW 81.1  91.4 - 78.2 %    Platelets 113 (*) 150 - 400 K/uL    Neutrophils Relative 89 (*) 43 - 77 %    Neutro Abs 12.7 (*) 1.7 - 7.7 K/uL    Lymphocytes Relative 4 (*) 12 - 46 %    Lymphs Abs 0.6 (*) 0.7 - 4.0 K/uL    Monocytes Relative 7  3 - 12 %     Monocytes Absolute 0.9  0.1 - 1.0 K/uL    Eosinophils Relative 0  0 - 5 %    Eosinophils Absolute 0.0  0.0 - 0.7 K/uL    Basophils Relative 0  0 - 1 %    Basophils Absolute 0.0  0.0 - 0.1 K/uL    WBC Morphology INCREASED BANDS (>20% BANDS)   VACUOLATED NEUTROPHILS   RBC Morphology POLYCHROMASIA PRESENT     COMPREHENSIVE METABOLIC PANEL     Status: Abnormal   Collection Time   02/29/12  4:39 AM      Component Value Range Comment   Sodium 136  135 - 145 mEq/L    Potassium 3.6  3.5 - 5.1 mEq/L    Chloride 111  96 - 112 mEq/L    CO2 15 (*) 19 - 32 mEq/L    Glucose, Bld 165 (*) 70 - 99 mg/dL    BUN 39 (*) 6 - 23 mg/dL    Creatinine, Ser 9.56 (*) 0.50 - 1.35 mg/dL    Calcium 6.3 (*) 8.4 - 10.5 mg/dL    Total Protein 3.0 (*) 6.0 - 8.3 g/dL  Albumin 1.0 (*) 3.5 - 5.2 g/dL    AST 41 (*) 0 - 37 U/L    ALT 16  0 - 53 U/L    Alkaline Phosphatase 37 (*) 39 - 117 U/L    Total Bilirubin 0.3  0.3 - 1.2 mg/dL    GFR calc non Af Amer 42 (*) >90 mL/min    GFR calc Af Amer 49 (*) >90 mL/min   PROCALCITONIN     Status: Normal   Collection Time   02/29/12  4:39 AM      Component Value Range Comment   Procalcitonin 31.67     LACTIC ACID, PLASMA     Status: Abnormal   Collection Time   02/29/12  4:52 AM      Component Value Range Comment   Lactic Acid, Venous 4.2 (*) 0.5 - 2.2 mmol/L   GLUCOSE, CAPILLARY     Status: Abnormal   Collection Time   02/29/12  7:30 AM      Component Value Range Comment   Glucose-Capillary 129 (*) 70 - 99 mg/dL    Comment 1 Documented in Chart      Comment 2 Notify RN     BLOOD GAS, ARTERIAL     Status: Abnormal   Collection Time   02/29/12  9:45 AM      Component Value Range Comment   FIO2 0.80      O2 Content 80.0      Delivery systems VENTILATOR      Mode PRESSURE REGULATED VOLUME CONTROL      VT 500      Rate 16      Peep/cpap 5.0      pH, Arterial 7.343 (*) 7.350 - 7.450    pCO2 arterial 29.4 (*) 35.0 - 45.0 mmHg    pO2, Arterial 97.7  80.0 - 100.0 mmHg     Bicarbonate 15.6 (*) 20.0 - 24.0 mEq/L    TCO2 13.7  0 - 100 mmol/L    Acid-base deficit 9.0 (*) 0.0 - 2.0 mmol/L    O2 Saturation 97.9      Patient temperature 37.0      Collection site A-LINE      Drawn by COLLECTED BY RT      Sample type ARTERIAL      Allens test (pass/fail) NOT INDICATED (*) PASS   GLUCOSE, CAPILLARY     Status: Abnormal   Collection Time   02/29/12 11:10 AM      Component Value Range Comment   Glucose-Capillary 134 (*) 70 - 99 mg/dL    Comment 1 Documented in Chart      Comment 2 Notify RN     GLUCOSE, CAPILLARY     Status: Abnormal   Collection Time   02/29/12  4:11 PM      Component Value Range Comment   Glucose-Capillary 134 (*) 70 - 99 mg/dL   BLOOD GAS, ARTERIAL     Status: Abnormal   Collection Time   02/29/12  6:10 PM      Component Value Range Comment   FIO2 0.70      O2 Content 70.0      Delivery systems VENTILATOR      Mode PRESSURE REGULATED VOLUME CONTROL      VT 440      Rate 20      Peep/cpap 12.0      pH, Arterial 7.274 (*) 7.350 - 7.450    pCO2 arterial 44.1  35.0 - 45.0  mmHg    pO2, Arterial 92.9  80.0 - 100.0 mmHg    Bicarbonate 19.7 (*) 20.0 - 24.0 mEq/L    TCO2 18.4  0 - 100 mmol/L    Acid-base deficit 5.9 (*) 0.0 - 2.0 mmol/L    O2 Saturation 96.8      Patient temperature 37.0      Collection site A-LINE      Drawn by [phone]      Sample type ARTERIAL      Allens test (pass/fail) NOT INDICATED (*) PASS   BLOOD GAS, ARTERIAL     Status: Abnormal (Preliminary result)   Collection Time   02/29/12  7:58 PM      Component Value Range Comment   FIO2 70.00      Delivery systems VENTILATOR      Mode PRESSURE REGULATED VOLUME CONTROL      VT 440      Rate 24      Peep/cpap 12.0      pH, Arterial 7.327 (*) 7.350 - 7.450    pCO2 arterial 39.8  35.0 - 45.0 mmHg    pO2, Arterial 77.6 (*) 80.0 - 100.0 mmHg    Bicarbonate 20.2  20.0 - 24.0 mEq/L    TCO2 18.7  0 - 100 mmol/L    Acid-base deficit 4.7 (*) 0.0 - 2.0 mmol/L    O2 Saturation  96.8      Patient temperature 37.0      Collection site LEFT RADIAL      Drawn by A-LINE      Sample type ARTERIAL      Allens test (pass/fail) PENDING  PASS   GLUCOSE, CAPILLARY     Status: Abnormal   Collection Time   02/29/12  8:03 PM      Component Value Range Comment   Glucose-Capillary 166 (*) 70 - 99 mg/dL    Comment 1 Documented in Chart      Comment 2 Notify RN     GLUCOSE, CAPILLARY     Status: Abnormal   Collection Time   03/01/12 12:12 AM      Component Value Range Comment   Glucose-Capillary 217 (*) 70 - 99 mg/dL    Comment 1 Documented in Chart      Comment 2 Notify RN     GLUCOSE, CAPILLARY     Status: Abnormal   Collection Time   03/01/12  4:51 AM      Component Value Range Comment   Glucose-Capillary 215 (*) 70 - 99 mg/dL    Comment 1 Documented in Chart      Comment 2 Notify RN     BLOOD GAS, ARTERIAL     Status: Abnormal   Collection Time   03/01/12  4:55 AM      Component Value Range Comment   FIO2 70.00      Delivery systems VENTILATOR      Mode PRESSURE REGULATED VOLUME CONTROL      VT 440      Rate 24      Peep/cpap 12.0      pH, Arterial 7.366  7.350 - 7.450    pCO2 arterial 41.6  35.0 - 45.0 mmHg    pO2, Arterial 112.0 (*) 80.0 - 100.0 mmHg    Bicarbonate 23.2  20.0 - 24.0 mEq/L    TCO2 21.0  0 - 100 mmol/L    Acid-base deficit 1.4  0.0 - 2.0 mmol/L    O2 Saturation 99.2  Patient temperature 37.0      Collection site A-LINE      Drawn by [id number]      Sample type ARTERIAL      Allens test (pass/fail) PASS  PASS   PROCALCITONIN     Status: Normal   Collection Time   03/01/12  5:08 AM      Component Value Range Comment   Procalcitonin 20.70     CHOLESTEROL, TOTAL     Status: Normal   Collection Time   03/01/12  5:09 AM      Component Value Range Comment   Cholesterol 39  0 - 200 mg/dL   TRIGLYCERIDES     Status: Normal   Collection Time   03/01/12  5:09 AM      Component Value Range Comment   Triglycerides 101  <150 mg/dL   CBC     Status:  Abnormal   Collection Time   03/01/12  5:14 AM      Component Value Range Comment   WBC 33.0 (*) 4.0 - 10.5 K/uL    RBC 3.48 (*) 4.22 - 5.81 MIL/uL    Hemoglobin 11.3 (*) 13.0 - 17.0 g/dL    HCT 65.7 (*) 84.6 - 52.0 %    MCV 92.2  78.0 - 100.0 fL    MCH 32.5  26.0 - 34.0 pg    MCHC 35.2  30.0 - 36.0 g/dL    RDW 96.2  95.2 - 84.1 %    Platelets 93 (*) 150 - 400 K/uL   COMPREHENSIVE METABOLIC PANEL     Status: Abnormal   Collection Time   03/01/12  5:14 AM      Component Value Range Comment   Sodium 137  135 - 145 mEq/L    Potassium 3.4 (*) 3.5 - 5.1 mEq/L    Chloride 106  96 - 112 mEq/L    CO2 24  19 - 32 mEq/L    Glucose, Bld 215 (*) 70 - 99 mg/dL    BUN 40 (*) 6 - 23 mg/dL    Creatinine, Ser 3.24 (*) 0.50 - 1.35 mg/dL    Calcium 6.6 (*) 8.4 - 10.5 mg/dL    Total Protein 3.8 (*) 6.0 - 8.3 g/dL    Albumin 1.8 (*) 3.5 - 5.2 g/dL    AST 38 (*) 0 - 37 U/L    ALT 17  0 - 53 U/L    Alkaline Phosphatase 54  39 - 117 U/L    Total Bilirubin 0.6  0.3 - 1.2 mg/dL    GFR calc non Af Amer 57 (*) >90 mL/min    GFR calc Af Amer 66 (*) >90 mL/min   PREALBUMIN     Status: Abnormal   Collection Time   03/01/12  5:14 AM      Component Value Range Comment   Prealbumin 3.0 (*) 17.0 - 34.0 mg/dL   MAGNESIUM     Status: Normal   Collection Time   03/01/12  5:14 AM      Component Value Range Comment   Magnesium 2.4  1.5 - 2.5 mg/dL   PHOSPHORUS     Status: Abnormal   Collection Time   03/01/12  5:14 AM      Component Value Range Comment   Phosphorus 1.7 (*) 2.3 - 4.6 mg/dL   DIFFERENTIAL     Status: Abnormal   Collection Time   03/01/12  5:14 AM      Component Value Range Comment  Neutrophils Relative 90 (*) 43 - 77 %    Neutro Abs 29.8 (*) 1.7 - 7.7 K/uL    Lymphocytes Relative 3 (*) 12 - 46 %    Lymphs Abs 0.8  0.7 - 4.0 K/uL    Monocytes Relative 7  3 - 12 %    Monocytes Absolute 2.4 (*) 0.1 - 1.0 K/uL    Eosinophils Relative 0  0 - 5 %    Eosinophils Absolute 0.0  0.0 - 0.7 K/uL     Basophils Relative 0  0 - 1 %    Basophils Absolute 0.1  0.0 - 0.1 K/uL    WBC Morphology INCREASED BANDS (>20% BANDS)   VACUOLATED NEUTROPHILS  GLUCOSE, CAPILLARY     Status: Abnormal   Collection Time   03/01/12  7:40 AM      Component Value Range Comment   Glucose-Capillary 197 (*) 70 - 99 mg/dL    Comment 1 Documented in Chart      Comment 2 Notify RN     VANCOMYCIN, [place]     Status: Normal   Collection Time   03/01/12  8:27 AM      Component Value Range Comment   Vancomycin Tr 10.6  10.0 - 20.0 ug/mL   PROTIME-INR     Status: Abnormal   Collection Time   03/01/12  8:27 AM      Component Value Range Comment   Prothrombin Time 18.2 (*) 11.6 - 15.2 seconds    INR 1.56 (*) 0.00 - 1.49   APTT     Status: Normal   Collection Time   03/01/12  8:27 AM      Component Value Range Comment   aPTT 36  24 - 37 seconds   BLOOD GAS, ARTERIAL     Status: Abnormal   Collection Time   03/01/12 12:18 PM      Component Value Range Comment   FIO2 0.60      Delivery systems VENTILATOR      Mode PRESSURE REGULATED VOLUME CONTROL      VT 440      Rate 24      Peep/cpap 10.0      pH, Arterial 7.353  7.350 - 7.450    pCO2 arterial 40.6  35.0 - 45.0 mmHg    pO2, Arterial 90.3  80.0 - 100.0 mmHg    Bicarbonate 22.0  20.0 - 24.0 mEq/L    TCO2 23.2  0 - 100 mmol/L    Acid-base deficit 2.7 (*) 0.0 - 2.0 mmol/L    O2 Saturation 97.4      Patient temperature 98.6      Collection site A-LINE      Drawn by [phone]      Sample type ARTERIAL DRAW      Allens test (pass/fail) PASS  PASS   GLUCOSE, CAPILLARY     Status: Abnormal   Collection Time   03/01/12 12:57 PM      Component Value Range Comment   Glucose-Capillary 198 (*) 70 - 99 mg/dL    Dg Chest Port 1 View  03/01/2012  *RADIOLOGY REPORT*  Clinical Data: Post central line placement  PORTABLE CHEST - 1 VIEW  Comparison: 03/01/2012; 02/27/2012  Findings:  Grossly unchanged enlarged cardiac silhouette and mediastinal contours.  Replacement of left  jugular approach of the venous catheter with tip projecting over the superior cavoatrial junction. Otherwise, stable position remain support apparatus.  No pneumothorax.  Lung volumes remain reduced with grossly  unchanged moderate to large sized bilateral effusions, left greater than right. Pulmonary vasculature remains indistinct.  Grossly unchanged bones.  IMPRESSION: 1.  Interval placement of left jugular procedure venous catheter with tip overlying the superior cavoatrial junction.  Otherwise, stable position of remaining support apparatus.  No pneumothorax. 2.  Persistently reduced lung volumes with grossly unchanged findings of pulmonary edema and moderate to large sized bilateral effusions and bibasilar opacities, left greater than right.   Original Report Authenticated By: Tacey Ruiz, MD    Dg Chest Port 1 View  03/01/2012  *RADIOLOGY REPORT*  Clinical Data: Worsening respiratory failure.  PORTABLE CHEST - 1 VIEW  Comparison: 02/29/2012  Findings: Endotracheal tube tip measures 4.4 cm above the carina. Enteric tube tip remains in place with tip below the field of view of the image.  Right PICC catheter is unchanged in position with tip over the cavoatrial junction.  Heart size and pulmonary vascularity demonstrate mild increased.  There is increasing opacification of both mid and lower lungs, greatest on the left. This is likely due to combination of increasing pleural effusions and consolidation in the lungs.  No pneumothorax.  IMPRESSION: Increasing opacification in the lungs likely representing increasing pleural effusions and atelectasis or consolidation in the lungs.   Original Report Authenticated By: Burman Nieves, M.D.    Dg Chest Port 1 View  02/29/2012  *RADIOLOGY REPORT*  Clinical Data: Respiratory failure  PORTABLE CHEST - 1 VIEW  Comparison: 02/28/2012  Findings: Endotracheal tube tip is above the carina.  There is a right arm PICC line with tip in the cavoatrial junction. Nasogastric  tube tip is below the GE junction within the stomach. There are decreased lung volumes.  Bilateral, posterior layering effusions and patchy areas of atelectasis are noted.  Compared with previous exam there appears to be slight increase in interstitial edema.  IMPRESSION:  1.  Stable support apparatus.  The nasogastric tube has been repositioned and is now situated with tip below the GE junction. 2.  Increase in edema.   Original Report Authenticated By: Signa Kell, M.D.    Portable Chest Xray  02/28/2012  *RADIOLOGY REPORT*  Clinical Data: Endotracheal tube placement.  PORTABLE CHEST - 1 VIEW  Comparison: 02/28/2012  Findings: Endotracheal tube has been placed, tip approximately 2.9 cm above carina.  Nasogastric tube is not coiled back upon itself, tip redirected cephalad at the level of the mid esophagus.  A right- sided PICC line tip overlies the level of the superior vena cava confluence with the right atrium.  Film is very shallow lung inflation.  There is dense consolidation at the left lung base, a new finding.  There are changes of mild pulmonary edema. Bilateral pleural effusions are suspected.  IMPRESSION: 1.  Endotracheal tube placed as described. 2.  Nasogastric tube is coiled. 3.  Interval development of left lower lobe atelectasis or consolidation. 3.  Bilateral pleural effusions.   Original Report Authenticated By: Norva Pavlov, M.D.        Assessment/Plan 1. Fulminant C. Diff colitis, s/p TAC with ileostomy 2. VDRF 3. New on set A. Fib 4. Septic shock  Plan: 1. Unclear right now why WBC has jumped from 14K to 33K.  His drains are serous, he has no evidence of wound infection, and no frank evidence of ischemic bowel.  His stoma is necrotic at the skin level, but underneath it is pink and viable.  I would recommend keeping his NGT to LIWS when he is not getting his  oral vancomycin.  I agree on not giving rectal vanc as he only has 8cm of rectum left.  This along with possible  devascularization, puts him at risk for rectal stump blow out.  Medical management per CCM.  We will follow along with you.  Do not see any acute evidence for reimaging or re-exploration.  Vernita Tague E 03/01/2012, 3:15 PM Pager: [phone]

## 2012-03-01 NOTE — Progress Notes (Signed)
NUTRITION FOLLOW UP  Intervention:   1. TPN per pharmacy: note-please see new kcal and protein goals   2. RD will continue to follow    Nutrition Dx:   Inadequate oral intake related to inability to eat as evidenced by NPO status and s/p GI surgery. Ongoing   Goal:   TPN to provide 60-70% of estimated calorie needs (22-25 kcals/kg ideal body weight) and >/= 90% of estimated protein needs, based on ASPEN guidelines for permissive underfeeding in critically ill obese individuals.   Monitor:   Vent status, weight trends, TPN-ability to transition to EN or PO nutrition, I/O's  Assessment:   RD consulted for new TPN. Pt was transferred from [facility], s/p cecostomy tube placement (2/3), colectomy (2/4), and septic with anastomotic stricture with megacolon. After procedure pt deteriorated, now requiring ventilator support. Now also on the ARDS protocol.   Pt now meets criteria for permissive underfeeding in the context of critically ill obese pt. Recommend TPN to meet 60-70% estimated kcal needs and >/=90% estimated protein needs. Noted: limitations with Clinimix may prevent [place] from meeting protein goals.   Height: Ht Readings from Last 1 Encounters:  02/26/12 5' 9"  (1.753 m)    Weight Status:   Wt Readings from Last 1 Encounters:  03/01/12 247 lb 5.7 oz (112.2 kg)  Body mass index is 36.53 kg/(m^2). Obesity class 2  Patient is currently intubated on ventilator support.  MV: 9 Temp:Temp (24hrs), Avg:99.4 F (37.4 C), Min:97.5 F (36.4 C), Max:100.3 F (37.9 C)  Propofol: none    Re-estimated needs:  Kcal: 2276 (underfeeding goal: 1365-1593 kcal) Protein: >/= 145 gm  Fluid: >/=2 L  Skin: abdominal incisions from recent surgery   Diet Order: NPO   Intake/Output Summary (Last 24 hours) at 03/01/12 1453 Last data filed at 03/01/12 1200  Gross per 24 hour  Intake 5524.21 ml  Output   2355 ml  Net 3169.21 ml    Last BM: none documented this admission    Labs:   Lab  03/01/12 0514 02/29/12 0439 02/28/12 2352  NA 137 136 134*  K 3.4* 3.6 3.9  CL 106 111 112  CO2 24 15* 15*  BUN 40* 39* 39*  CREATININE 1.38* 1.78* 1.84*  CALCIUM 6.6* 6.3* 5.9*  MG 2.4 -- --  PHOS 1.7* -- 2.2*  GLUCOSE 215* 165* 188*    CBG (last 3)   Basename 03/01/12 1257 03/01/12 0740 03/01/12 0451  GLUCAP 198* 197* 215*    Scheduled Meds:   . amiodarone  150 mg Intravenous Once  . antiseptic oral rinse  15 mL Mouth Rinse QID  . aztreonam  1 g Intravenous Q8H  . chlorhexidine  15 mL Mouth Rinse BID  . hydrocortisone sodium succinate  50 mg Intravenous Q6H  . insulin aspart  0-20 Units Subcutaneous Q4H  . ipratropium  0.5 mg Nebulization Q6H  . levalbuterol  0.63 mg Nebulization Q6H  . metronidazole  500 mg Intravenous Q6H  . pantoprazole (PROTONIX) IV  40 mg Intravenous Daily  . potassium phosphate IVPB (mmol)  20 mmol Intravenous Once  . sodium chloride  10-40 mL Intracatheter Q12H  . sodium chloride  3 mL Intravenous Q12H  . vancomycin  1,250 mg Intravenous Q12H  . vancomycin  500 mg Oral Q8H    Continuous Infusions:   . sodium chloride    . sodium chloride    . amiodarone (NEXTERONE PREMIX) 360 mg/200 mL dextrose 30 mg/hr (03/01/12 1245)  . TPN (CLINIMIX) +/- additives  50 mL/hr at 03/01/12 1000   And  . fat emulsion 250 mL (03/01/12 1000)  . fentaNYL infusion INTRAVENOUS    . midazolam (VERSED) infusion    . phenylephrine (NEO-SYNEPHRINE) Adult infusion 200 mcg/min (03/01/12 1346)  . TPN (CLINIMIX) +/- additives    . vasopressin (PITRESSIN) infusion - *FOR SHOCK* 0.01 Units/min (03/01/12 0600)    Orson Slick RD, LDN Pager [phone] After Hours pager [phone]

## 2012-03-01 NOTE — Consult Note (Signed)
General surgery attending note:  I have examined this patient. He is heavily sedated on the ventilator. He is requiring pressor support. Does have some urine output. Still in rapid atrial fibrillation.I agree with the assessment by Ms. Maxwell Caul, [place]  He is in septic shock, presumably from his C. Difficile colitis.  There are a few surgical concerns: His ileostomy is dusky at the skin level but was pink on test tube test in the subcutaneous tissue. We will have to follow this very closely as it may necrosis further and require reexploration.  Abdomen is distended and somewhat tight, probably from edema. He will need to be watched for abdominal compartment syndrome.  Abdominal incision was closed with staples. He is at high risk for infection. I am considering removing the staples at some point.  Mortality rates are high if the patient requires reexploration at this point.  Prognosis is guarded   Edsel Petrin. Dalbert Batman, M.D., [facility] General and Minimally invasive Surgery Breast and Colorectal Surgery Office:   [phone] Pager:   [phone]

## 2012-03-01 NOTE — Progress Notes (Addendum)
AFib with RVR; appropriately fluid resuscitated with a CVP 17  Start Amiodarone;

## 2012-03-01 NOTE — Procedures (Signed)
Arterial Catheter Insertion Procedure Note Connor Small [id number] 1960/05/27  Procedure: Insertion of Arterial Catheter  Indications: Blood pressure monitoring and Frequent blood sampling  Procedure Details Consent: Unable to obtain consent because of emergent medical necessity. Time Out: Verified patient identification, verified procedure, site/side was marked, verified correct patient position, special equipment/implants available, medications/allergies/relevent history reviewed, required imaging and test results available.  Performed  Maximum sterile technique was used including antiseptics, cap, gloves, gown, hand hygiene, mask and sheet. Skin prep: Chlorhexidine; local anesthetic administered 20 gauge catheter was inserted into right radial artery using the Seldinger technique.  Evaluation Blood flow good; BP tracing good. Complications: No apparent complications.  Procedure performed under the direct supervision of Dr. Nelda Marseille with ultrasound guidance for real time vessel cannulation.    Corey Harold NP-S  03/01/2012  Patient seen and examined, agree with above note.  I dictated the care and orders written for this patient under my direction.  Rush Farmer, MD [phone]

## 2012-03-01 NOTE — Progress Notes (Signed)
ANTIBIOTIC CONSULT NOTE - FOLLOW UP  Pharmacy Consult for Aztreonam Indication: rule out sepsis  Allergies  Allergen Reactions  . Penicillins Other (See Comments)    Heart rate changes    Patient Measurements: Height: 5' 9"  (175.3 cm) Weight: 247 lb 5.7 oz (112.2 kg) IBW/kg (Calculated) : 70.7   Vital Signs: Temp: 97.5 F (36.4 C) (02/06 1313) Temp src: Oral (02/06 1313) BP: 91/58 mmHg (02/06 1200) Pulse Rate: 119  (02/06 1230) Intake/Output from previous day: 02/05 0701 - 02/06 0700 In: 6606.4 [I.V.:4031.4; IV Piggyback:1660; TPN:765] Out: 1950 [Urine:1500; Emesis/NG output:50; Drains:180; Stool:220] Intake/Output from this shift: Total I/O In: 1073.7 [I.V.:773.7; TPN:300] Out: 455 [Drains:145; Stool:310]  Labs:  [facility] 03/01/12 0514 02/29/12 0439 02/28/12 2352 02/28/12 1924  WBC 33.0* 14.2* -- 3.9*  HGB 11.3* 14.5 -- 13.7  PLT 93* 113* -- 124*  LABCREA -- -- -- --  CREATININE 1.38* 1.78* 1.84* --   Estimated Creatinine Clearance: 76.4 ml/min (by C-G formula based on Cr of 1.38).  Basename 03/01/12 0827  VANCOTROUGH 10.6  VANCOPEAK --  VANCORANDOM --  GENTTROUGH --  GENTPEAK --  GENTRANDOM --  TOBRATROUGH --  TOBRAPEAK --  TOBRARND --  AMIKACINPEAK --  AMIKACINTROU --  AMIKACIN --     Assessment: Connor Small was transferred from [facility]. He was being managed there for sepsis s/p colectomy severe C. difficile colitis/toxic megacolon. Noted he was initiated on IV Vanc and flagyl there. This AM, Vanc dose was adjusted for a sub-therapeutic trough. Noted patient's Scr has been improving, and UOP is increasing as well. Patient has low grade fever, WBC elevated at 33 (pt is post-op, and is on stress dose steroids), PCT is high at 20.7 and last Lac was 4.2 yesterday. Received orders to start empiric Aztreonam to cover for gram negative organisms.  2/3: Cipro>> 2/4 2/3: Flagyl>> 2/2: Tamiflu>> 2/2 2/3: Vanc>> 2/6: PO Vanc>> 2/6: Aztreonam>>  2/4: Blood:  ngtd 2/3: Blood: ngtd 2/4: Urine: neg 2/3: MRSA PCR: neg  Goal of Therapy:  Vancomycin trough level 15-20 mcg/ml  Plan:  - Aztreonam 1 gm IV q 8h, start ASAP - Will plan to re-check Vanc trough at [facility] - Will monitor cx/spec/sens, renal fn and clinical status daily.  Thanks, Oneisha Ammons K. Posey Pronto, PharmD, BCPS.  Clinical Pharmacist Pager [phone]. 03/01/2012 2:11 PM

## 2012-03-01 NOTE — Progress Notes (Addendum)
Reported off to [place], [place] w/ transfer report.  [facility] here at bedside preparing to transport pt, family instructed and understands what is happening  Made receiving RN aware that I had given 0800 meds and had not given 1000 meds. Also made her aware that if I could get to the pt before [facility], I would give him his prn pain and sedation meds and that she should look in the [facility] to verify if It was given, stated she understood

## 2012-03-01 NOTE — H&P (Signed)
PULMONARY  / CRITICAL CARE MEDICINE  Name: Connor Small MRN: [id number] DOB: 01/18/1961    ADMISSION DATE:  02/26/2012 CONSULTATION DATE:  2/5  REFERRING MD :  Karilyn Cota  CHIEF COMPLAINT:  Acute resp failure   BRIEF PATIENT DESCRIPTION:  59 YOM admitted to [facility] for Cdiff on 2/2. Underwent decompressive cecostomy 2/3, initially was better, then worsened on 2/4 with increased abdominal distension and tenderness, decreased renal function.  He was taken back to the OR on 2/4 for total abdominal colectomy and end ileostomy.  Post op he developed ARDS and sepsis shock and was being treated for this.  The morning of 2/6 he had new onset of A-fib and transferred to [facility].    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 male who presented to OSH with low back pain, fever to 102.5, and about 10 episodes of watery diarrhea on 2/2.  This was followed by increased abdominal pain and distension.  Diagnosed with C-diff and underwent decompressive cecostomy 2/3.  He had initially improved, then worsened on 2/4 with increased abdominal distension and tenderness, decreased renal function.  He was taken back to the OR on 2/4 for total abdominal colectomy and end ileostomy due to c-diff infection and toxic megacolon.  Operative notes describes a stricture was identified and there was some minimal spillage of bowel contents. Post op he developed ARDS and sepsis shock and was being treated for this.  The morning of 2/6 he had new onset  of A-fib and transferred to [facility].     PAST MEDICAL HISTORY :  Past Medical History  Diagnosis Date  . Chronic diarrhea   . Rectal bleed   . Hemorrhoids   . Ulcerative colitis     Distal UC over 8 yrs ago diagnosed  . Diverticulitis of large intestine with perforation 10/2011    done at [place] [facility]   Past Surgical History  Procedure Date  . Colonoscopy 9/03  . Sigmoidoscopy      06/13/2002  . Temporary ostomy November 06, 2010     for a colon perforation that was done in [facility] (Dr Ruben Im).    . Colonoscopy 02/16/2011    Procedure: COLONOSCOPY;  Surgeon: Malissa Hippo, MD;  Location: [facility];  Service: Endoscopy;  Laterality: N/A;  100  . Flexible sigmoidoscopy 02/27/2012    Procedure: FLEXIBLE SIGMOIDOSCOPY;  Surgeon: Malissa Hippo, MD;  Location: [facility];  Service: Endoscopy;  Laterality: N/A;  with colonic decompression  . Laparotomy 02/27/2012    Procedure: EXPLORATORY LAPAROTOMY;  Surgeon: Fabio Bering, MD;  Location: [facility] ORS;  Service: General;  Laterality: N/A;  . Cecostomy 02/27/2012    Procedure: CECOSTOMY;  Surgeon: Fabio Bering, MD;  Location: [facility] ORS;  Service: General;  Laterality: N/A;  Cecostomy Tube Placement  . Colectomy 02/28/2012    Procedure: TOTAL COLECTOMY;  Surgeon: Fabio Bering, MD;  Location: [facility] ORS;  Service: General;  Laterality: N/A;  . Ileostomy  02/28/2012    Procedure: ILEOSTOMY;  Surgeon: Fabio Bering, MD;  Location: [facility] ORS;  Service: General;  Laterality: N/A;   Prior to Admission medications   Medication Sig Start Date End Date Taking? Authorizing Provider  bifidobacterium infantis (ALIGN) capsule Take 1 capsule by mouth daily. 07/18/11 07/17/12 Yes Malissa Hippo, MD  FORTESTA 10 MG/ACT (2%) GEL at bedtime. Patient states that he applies it to his leg 12/05/11  Yes Historical Provider, MD  ibuprofen (ADVIL,MOTRIN) 200 MG tablet Take 400 mg by mouth 2 (two) times daily as needed. For pain   Yes Historical Provider, MD   Mesalamine (ASACOL HD) 800 MG TBEC Take 2 tablets (1,600 mg total) by mouth 2 (two) times daily. 01/16/12  Yes Malissa Hippo, MD  Pseudoeph-Doxylamine-DM-APAP (NYQUIL D COLD/FLU) 60-12.06-22-998 MG/30ML LIQD Take 30 mLs by mouth at bedtime. Cold/flu symptoms   Yes Historical Provider, MD  Pseudoephedrine-APAP-DM (DAYQUIL MULTI-SYMPTOM COLD/FLU PO) Take 1 capsule by mouth every 4 (four) hours as needed. Cold symptoms   Yes Historical Provider, MD   Allergies  Allergen Reactions  . Penicillins Other (See Comments)    Heart rate changes    FAMILY HISTORY:  Family History  Problem Relation Age of Onset  . Cancer Sister   . Healthy Daughter   . Hypertension Mother    SOCIAL HISTORY:  reports that he quit smoking about 27 years ago. His smoking use included Cigarettes. He has never used smokeless tobacco. He reports that he does not drink alcohol or use illicit drugs.  REVIEW OF SYSTEMS:   Unable   SUBJECTIVE:  Sedated  VITAL SIGNS: Temp:  [99.6 F (37.6 C)-100.3 F (37.9 C)] 100.2 F (37.9 C) (02/06 0500) Pulse Rate:  [53-159] 90  (02/06 1000) Resp:  [18-36] 24  (02/06 1000) SpO2:  [95 %-100 %] 98 % (02/06 1000) Arterial Line BP: (82-189)/(44-130) 105/68 mmHg (02/06 1000) FiO2 (%):  [59.4 %-100 %] 60.2 % (02/06 1000) Weight:  [112.2 kg (247 lb 5.7 oz)] 112.2 kg (247 lb 5.7 oz) (02/06 0500) HEMODYNAMICS: CVP:  [12 mmHg-18 mmHg] 18 mmHg VENTILATOR SETTINGS: Vent Mode:  [-] PRVC FiO2 (%):  [59.4 %-100 %] 60.2 % Set Rate:  [16 bmp-24 bmp] 24 bmp Vt Set:  [440 mL-500 mL] 440 mL PEEP:  [4.8 cmH20-11.8 cmH20] 10 cmH20 Plateau Pressure:  [15 cmH20-33 cmH20] 15 cmH20 INTAKE / OUTPUT: Intake/Output      02/05 0701 - 02/06 0700 02/06 0701 - 02/07 0700   I.V. (mL/kg) 4031.4 (35.9) 457.1 (4.1)   Other 150    IV Piggyback 1660    TPN 765 180   Total Intake(mL/kg) 6606.4 (58.9) 637.1 (5.7)   Urine (mL/kg/hr) 1500 (0.6)    Emesis/NG output 50    Drains 180 60   Stool 220     Total Output 1950 60   Net +4656.4 +577.1          PHYSICAL EXAMINATION: General:  Ill appearing ventilated patient Neuro:  Moves all extremities spontaneously , but does not follow command.  GCS - 8 HEENT: Moist MM, Pupils PEARL Cardiovascular:  A-fib, no murmur, gallop Lungs:  Diffuse rhonchi throughout, decreased air movement in lower lobes Abdomen: Hard, with some distension, vound vac on surgical site. Musculoskeletal: Bilateral  Lower extremity +2 edema Skin: Surgical site/wound appear CDI, otherwise skin intact  LABS:  Lab 03/01/12 0827 03/01/12 3086 03/01/12 5784 03/01/12 0455 02/29/12 1958 02/29/12 1810 02/29/12 0452 02/29/12 0439 02/28/12 2352 02/28/12 1924 02/28/12 1244 02/26/12 1409  HGB -- 11.3* -- -- -- -- -- 14.5 -- 13.7 -- --  WBC -- 33.0* -- -- -- -- -- 14.2* -- 3.9* -- --  PLT -- 93* -- -- -- -- -- 113* -- 124* -- --  NA -- 137 -- -- -- -- -- 136 134* -- -- --  K -- 3.4* -- -- -- -- -- 3.6 -- -- -- --  CL -- 106 -- -- -- -- -- 111 112 -- -- --  CO2 -- 24 -- -- -- -- -- 15* 15* -- -- --  GLUCOSE -- 215* -- -- -- -- -- 165* 188* -- -- --  BUN -- 40* -- -- -- -- -- 39* 39* -- -- --  CREATININE -- 1.38* -- -- -- -- -- 1.78* 1.84* -- -- --  CALCIUM -- 6.6* -- -- -- -- -- 6.3* 5.9* -- -- --  MG -- 2.4 -- -- -- -- -- -- -- -- -- --  PHOS -- 1.7* -- -- -- -- -- -- 2.2* -- -- --  AST -- 38* -- -- -- -- -- 41* 32 -- -- --  ALT -- 17 -- -- -- -- -- 16 12 -- -- --  ALKPHOS -- 54 -- -- -- -- -- 37* 19* -- -- --  BILITOT -- 0.6 -- -- -- -- -- 0.3 0.4 -- -- --  PROT -- 3.8* -- -- -- -- -- 3.0* 2.7* -- -- --  ALBUMIN -- 1.8* -- -- -- -- -- 1.0* 1.0* -- -- --  APTT 36 -- -- -- -- -- -- -- -- -- -- --  INR 1.56* -- -- -- -- -- -- -- -- -- -- --  LATICACIDVEN -- -- -- -- -- -- 4.2* -- -- 3.6* 6.7* --  TROPONINI -- -- -- -- -- -- -- -- -- -- -- <0.30  PROCALCITON -- -- 20.70 -- -- -- -- 31.67 -- 21.86 -- --  PROBNP -- -- -- -- -- -- -- -- -- -- -- --  O2SATVEN -- -- -- -- --  -- -- -- -- -- -- --  PHART -- -- -- 7.366 7.327* 7.274* -- -- -- -- -- --  PCO2ART -- -- -- 41.6 39.8 44.1 -- -- -- -- -- --  PO2ART -- -- -- 112.0* 77.6* 92.9 -- -- -- -- -- --    Lab 03/01/12 0740 03/01/12 0451 03/01/12 0012 02/29/12 2003 02/29/12 1611  GLUCAP 197* 215* 217* 166* 134*    CXR: ett good, bilateral airspace disease w/ L>R effusions.   ASSESSMENT / PLAN:  PULMONARY A:  Acute respiratory failure: multifactorial: volume excess, and Bilateral pleural effusions. Possible ALI P:   ards protocol Sedation protocol See ID section Scheduled BD Diurese when BP will allow.   CARDIOVASCULAR A: Septic Shock - Secondary to C-diff colitis  Atrial Fibrillation P: .  -Neo for goal MAP > 65 mmhG -Monitor CVP, goal > 7 -Amiodarone -stress dose steroids -see ID section  -will need to decide on anticoagulation at some point.   RENAL A:  Acute Renal Failure - Likely multifactorial from a combo of dye-induction and hypotensive ATN.  Urine output and SCr improving (Scr peaked at 2.12 on 2/4) P:   - Monitor urine output -F/u chem  GASTROINTESTINAL A: C-Diff Colitis, abdominal colectomy and end ileostomy (2/5), toxic megacolon P:   -NPO -cont central TNA  -Continue Antibiotic therapy -Consult surgery   HEMATOLOGIC A: No acute issue P:  -Follow CBC  and Coags   INFECTIOUS A:  Septic Shock - Secondary to C-diff and possible translocation bacteria during surgery. Was on IV vanc. Unclear if new super-infection. Have widened coverage for now. Would rapidly narrow as culture data dictates.  P:   See cardiovascular PO Vanc  ENDOCRINE A:  Hyperglycemia ? Steroid induced  P:   ssi   NEUROLOGIC A:  Sedated. Acute encephalopathy d/t sepsis.  P:   Sedation protocol  Supportive care.   TODAY'S SUMMARY: need to assess hemodynamics, will treat as ARDS and sepsis. Widen abx now to cover for potential additional source ie: bacterial translocation or HCAP.   I have  personally obtained a history, examined the patient, evaluated laboratory and imaging results, formulated the assessment and plan and placed orders. CRITICAL CARE: The patient is critically ill with multiple organ systems failure and requires high complexity decision making for assessment and support, frequent evaluation and titration of therapies, application of advanced monitoring technologies and extensive interpretation of multiple databases. Critical Care Time devoted to patient care services described in this note is -- minutes.   Kate Sable NP-[place] Pager: [phone]  03/01/2012, 11:33 AM  CC time 45 min.  Patient seen and examined, agree with above note.  I dictated the care and orders written for this patient under my direction.  Alyson Reedy, MD [phone]

## 2012-03-01 NOTE — Progress Notes (Signed)
Subjective: Interval History: none.  Objective: Vital signs in last 24 hours: Temp:  [98 F (36.7 C)-100.3 F (37.9 C)] 100.2 F (37.9 C) (02/06 0500) Pulse Rate:  [109-151] 109  (02/06 0400) Resp:  [20-36] 25  (02/06 0400) BP: (101-126)/(66-83) 101/72 mmHg (02/05 0915) SpO2:  [95 %-99 %] 98 % (02/06 0400) Arterial Line BP: (82-189)/(44-130) 99/52 mmHg (02/06 0415) FiO2 (%):  [69.3 %-80.5 %] 70.1 % (02/06 0400) Weight:  [112.2 kg (247 lb 5.7 oz)] 112.2 kg (247 lb 5.7 oz) (02/06 0500) Weight change: 4.9 kg (10 lb 12.8 oz)  Intake/Output from previous day: 02/05 0701 - 02/06 0700 In: 4906.4 [I.V.:3401.4; IV Piggyback:1460; TPN:45] Out: 1730 [Urine:1500; Emesis/NG output:50; Drains:180] Intake/Output this shift:    Gen. patient is still intubated and sedated. Chest he has bilateral inspiratory crackles anteriorly Heart exam revealed regular rate and rhythm and tachycardic Abdomen hypoactive Extremities 1-2+ edema.   Lab Results:  [facility] 03/01/12 0514 02/29/12 0439  WBC 33.0* 14.2*  HGB 11.3* 14.5  HCT 32.1* 41.7  PLT 93* 113*   BMET:  Basename 03/01/12 0514 02/29/12 0439  NA 137 136  K 3.4* 3.6  CL 106 111  CO2 24 15*  GLUCOSE 215* 165*  BUN 40* 39*  CREATININE 1.38* 1.78*  CALCIUM 6.6* 6.3*   No results found for this basename: PTH:2 in the last 72 hours Iron Studies: No results found for this basename: IRON,TIBC,TRANSFERRIN,FERRITIN in the last 72 hours  Studies/Results: Dg Chest Port 1 View  03/01/2012  *RADIOLOGY REPORT*  Clinical Data: Worsening respiratory failure.  PORTABLE CHEST - 1 VIEW  Comparison: 02/29/2012  Findings: Endotracheal tube tip measures 4.4 cm above the carina. Enteric tube tip remains in place with tip below the field of view of the image.  Right PICC catheter is unchanged in position with tip over the cavoatrial junction.  Heart size and pulmonary vascularity demonstrate mild increased.  There is increasing opacification of both mid and  lower lungs, greatest on the left. This is likely due to combination of increasing pleural effusions and consolidation in the lungs.  No pneumothorax.  IMPRESSION: Increasing opacification in the lungs likely representing increasing pleural effusions and atelectasis or consolidation in the lungs.   Original Report Authenticated By: Lucienne Capers, M.D.    Dg Chest Port 1 View  02/29/2012  *RADIOLOGY REPORT*  Clinical Data: Respiratory failure  PORTABLE CHEST - 1 VIEW  Comparison: 02/28/2012  Findings: Endotracheal tube tip is above the carina.  There is a right arm PICC line with tip in the cavoatrial junction. Nasogastric tube tip is below the GE junction within the stomach. There are decreased lung volumes.  Bilateral, posterior layering effusions and patchy areas of atelectasis are noted.  Compared with previous exam there appears to be slight increase in interstitial edema.  IMPRESSION:  1.  Stable support apparatus.  The nasogastric tube has been repositioned and is now situated with tip below the GE junction. 2.  Increase in edema.   Original Report Authenticated By: Kerby Moors, M.D.    Portable Chest Xray  02/28/2012  *RADIOLOGY REPORT*  Clinical Data: Endotracheal tube placement.  PORTABLE CHEST - 1 VIEW  Comparison: 02/28/2012  Findings: Endotracheal tube has been placed, tip approximately 2.9 cm above carina.  Nasogastric tube is not coiled back upon itself, tip redirected cephalad at the level of the mid esophagus.  A right- sided PICC line tip overlies the level of the superior vena cava confluence with the right atrium.  Film  is very shallow lung inflation.  There is dense consolidation at the left lung base, a new finding.  There are changes of mild pulmonary edema. Bilateral pleural effusions are suspected.  IMPRESSION: 1.  Endotracheal tube placed as described. 2.  Nasogastric tube is coiled. 3.  Interval development of left lower lobe atelectasis or consolidation. 3.  Bilateral pleural  effusions.   Original Report Authenticated By: Nolon Nations, M.D.    Dg Chest Port 1 View  02/28/2012  *RADIOLOGY REPORT*  Clinical Data: Confirm line placement  PORTABLE CHEST - 1 VIEW  Comparison: Plain film 02/27/2012  Findings: There is an NG tube with tip at the gastric GE junction. Interval placement of a right PICC line with tip in the distal SVC. Stable cardiac silhouette.  There are low lung volumes and basilar atelectasis.  No pneumothorax.  IMPRESSION:  1.  NG tube with tip at the GE junction. 2.  New right PICC line with tip in distal SVC. 3.  Bibasilar atelectasis and low lung volumes.   Original Report Authenticated By: Suzy Bouchard, M.D.     I have reviewed the patient's current medications.  Assessment/Plan:  problem #1 acute kidney injury most likely a combination of dye-induced renal failure and ATN. His BUN and creatinine is improving and patient presently is none oliguric. Problem #2 metabolic acidosis that seems to be corrected. Patient on sodium bicarbonate drip. Problem #3 hypokalemia Problem #4 C. difficile colitis patient presently a febrile his white blood cell count is still remains high. Problem #5. Status post total colectomy Problem #6 hypophosphatemia patient started on TPN. Problem #7  Atrial  fibrillation. Plan: Increase TPN to 70 cc per hour DC sodium bicarbonate Change IV fluid to half-normal saline with 20 mEq of KCl at 40 cc per hour Continue with IV albumin. We'll check his basic metabolic panel in the morning.      LOS: 4 days   Kaylan Friedmann S 03/01/2012,7:10 AM

## 2012-03-02 ENCOUNTER — Encounter (HOSPITAL_COMMUNITY): Payer: Self-pay

## 2012-03-02 DIAGNOSIS — R6521 Severe sepsis with septic shock: Secondary | ICD-10-CM | POA: Diagnosis present

## 2012-03-02 DIAGNOSIS — J9600 Acute respiratory failure, unspecified whether with hypoxia or hypercapnia: Secondary | ICD-10-CM | POA: Diagnosis present

## 2012-03-02 LAB — CBC
HCT: 36 % — ABNORMAL LOW (ref 39.0–52.0)
Hemoglobin: 11.9 g/dL — ABNORMAL LOW (ref 13.0–17.0)
MCV: 93.3 fL (ref 78.0–100.0)
RBC: 3.74 MIL/uL — ABNORMAL LOW (ref 4.22–5.81)
RDW: 16.2 % — ABNORMAL HIGH (ref 11.5–15.5)
WBC: 35.6 10*3/uL — ABNORMAL HIGH (ref 4.0–10.5)
WBC: 35.3 10*3/uL — ABNORMAL HIGH (ref 4.0–10.5)

## 2012-03-02 LAB — BLOOD GAS, ARTERIAL
Bicarbonate: 22.7 mEq/L (ref 20.0–24.0)
FIO2: 0.6 %
O2 Saturation: 98.1 %
PEEP: 10 cmH2O
TCO2: 24.2 mmol/L (ref 0–100)
pO2, Arterial: 89.2 mmHg (ref 80.0–100.0)

## 2012-03-02 LAB — COMPREHENSIVE METABOLIC PANEL
ALT: 15 U/L (ref 0–53)
BUN: 39 mg/dL — ABNORMAL HIGH (ref 6–23)
CO2: 22 mEq/L (ref 19–32)
Calcium: 6.4 mg/dL — CL (ref 8.4–10.5)
Creatinine, Ser: 1.21 mg/dL (ref 0.50–1.35)
GFR calc Af Amer: 77 mL/min — ABNORMAL LOW (ref >90)
GFR calc non Af Amer: 67 mL/min — ABNORMAL LOW (ref >90)
Glucose, Bld: 315 mg/dL — ABNORMAL HIGH (ref 70–99)
Total Protein: 3.8 g/dL — ABNORMAL LOW (ref 6.0–8.3)

## 2012-03-02 LAB — BASIC METABOLIC PANEL
BUN: 40 mg/dL — ABNORMAL HIGH (ref 6–23)
CO2: 22 mEq/L (ref 19–32)
Chloride: 106 mEq/L (ref 96–112)
Creatinine, Ser: 1.23 mg/dL (ref 0.50–1.35)
GFR calc Af Amer: 76 mL/min — ABNORMAL LOW (ref >90)

## 2012-03-02 LAB — GLUCOSE, CAPILLARY
Glucose-Capillary: 347 mg/dL — ABNORMAL HIGH (ref 70–99)
Glucose-Capillary: 314 mg/dL — ABNORMAL HIGH (ref 70–99)
Glucose-Capillary: 318 mg/dL — ABNORMAL HIGH (ref 70–99)
Glucose-Capillary: 290 mg/dL — ABNORMAL HIGH (ref 70–99)

## 2012-03-02 LAB — MAGNESIUM: Magnesium: 2.6 mg/dL — ABNORMAL HIGH (ref 1.5–2.5)

## 2012-03-02 MED ORDER — LEVALBUTEROL HCL 0.63 MG/3ML IN NEBU
0.6300 mg | INHALATION_SOLUTION | RESPIRATORY_TRACT | Status: DC | PRN
  Filled 2012-03-02: qty 3

## 2012-03-02 MED ORDER — INSULIN GLARGINE 100 UNIT/ML ~~LOC~~ SOLN: 15.0000 [IU] | Freq: Every day | SUBCUTANEOUS | Status: DC

## 2012-03-02 MED ORDER — VANCOMYCIN 50 MG/ML ORAL SOLUTION
500.0000 mg | Freq: Four times a day (QID) | ORAL | Status: DC
  Administered 2012-03-02 – 2012-03-19 (×68): 500 mg via ORAL
  Filled 2012-03-02 (×72): qty 10

## 2012-03-02 MED ORDER — FAMOTIDINE IN NACL 20-0.9 MG/50ML-% IV SOLN
20.0000 mg | Freq: Two times a day (BID) | INTRAVENOUS | Status: AC
  Administered 2012-03-02 – 2012-03-06 (×8): 20 mg via INTRAVENOUS
  Filled 2012-03-02 (×10): qty 50

## 2012-03-02 MED ORDER — FAT EMULSION 20 % IV EMUL
250.0000 mL | INTRAVENOUS | Status: AC
  Administered 2012-03-02: 250 mL via INTRAVENOUS
  Filled 2012-03-02: qty 250

## 2012-03-02 MED ORDER — M.V.I. ADULT IV INJ
INTRAVENOUS | Status: DC
  Administered 2012-03-02: 17:00:00 via INTRAVENOUS
  Filled 2012-03-02: qty 2000

## 2012-03-02 MED ORDER — IPRATROPIUM BROMIDE 0.02 % IN SOLN: 0.5000 mg | RESPIRATORY_TRACT | Status: DC | PRN

## 2012-03-02 NOTE — Progress Notes (Signed)
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 Penicillins Other (See Comments)    Heart rate changes    Patient Measurements: Height: 5' 9"  (175.3 cm) Weight: 242 lb 15.2 oz (110.2 kg) IBW/kg (Calculated) : 70.7   Vital Signs: Temp: 98.2 F (36.8 C) (02/07 0406) Temp src: Oral (02/07 0406) BP: 120/86 mmHg (02/07 0700) Pulse Rate: 132  (02/07 0700) Intake/Output from previous day: 02/06 0701 - 02/07 0700 In: 5554.6 [I.V.:2906.6; IV Piggyback:1088; TPN:1560] Out: 3270 [Urine:1325; Drains:1360; Stool:585] Intake/Output from this shift:    Labs:  Basename 03/02/12 0500 03/02/12 0042 03/01/12 0827 03/01/12 0514  WBC 35.3* 35.6* -- 33.0*  HGB 12.6* 11.9* -- 11.3*  HCT 36.0* 34.5* -- 32.1*  PLT 74* 76* -- 93*  APTT -- -- 36 --  INR -- -- 1.56* --     Basename 03/02/12 0500 03/02/12 0042 03/01/12 0514 03/01/12 0509 02/29/12 0439 02/28/12 2352  NA 134* 135 137 -- -- --  K 4.7 4.6 3.4* -- -- --  CL 106 106 106 -- -- --  CO2 22 22 24  -- -- --  GLUCOSE 333* 315* 215* -- -- --  BUN 40* 39* 40* -- -- --  CREATININE 1.23 1.21 1.38* -- -- --  LABCREA -- -- -- -- -- --  CREAT24HRUR -- -- -- -- -- --  CALCIUM 6.6* 6.4* 6.6* -- -- --  MG 2.6* 2.6* 2.4 -- -- --  PHOS 2.6 -- 1.7* -- -- 2.2*  PROT -- 3.8* 3.8* -- 3.0* --  ALBUMIN -- 1.4* 1.8* -- 1.0* --  AST -- 28 38* -- 41* --  ALT -- 15 17 -- 16 --  ALKPHOS -- 80 54 -- 37* --  BILITOT -- 0.7 0.6 -- 0.3 --  BILIDIR -- -- -- -- -- --  IBILI -- -- -- -- -- --  PREALBUMIN -- -- 3.0* -- -- --  TRIG -- -- -- 101 -- --  CHOLHDL -- -- -- -- -- --  CHOL -- -- -- 39 -- --   Estimated Creatinine Clearance: 85 ml/min (by C-G formula based on Cr of 1.23).    Basename 03/02/12 0401 03/01/12 2340 03/01/12 2044  GLUCAP 290* 259* 298*   Insulin Requirements in the past 12 hours:  10 units  insulin R in TPN + 29 units Novolog with SSI   Nutritional Goals:  Underfeeding goal: 1540-0867 kcal/day (60-70% of full goal of 2276 kcal/day), >/= 130g protein per day (90% of full goal of >/= 145 grams per day), >/= 2L fluid/day per RD recommendations on 03/01/12   Current Nutrition:  Clinimix E5/15 at 100 ml/hr provides 120 gm protein/day (~92% goal) and an average of 1909kcal/day (~120% underfeeding goal range)  Assessment: 52 y.o. M with history of ulcerative colitis presented to the hospital with back pain, fever and diarrhea. 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 TPN was initiated on 02/29/12 for nutrition support. Transferred to [facility] on 2/6 for continued management of care.   GI: Hx ulcerative colitis - previously in remission with mesalamine - developed toxic megacolon d/t cdiff colitis. Failed medical management, now POD#3 total colectomy. Started on TPN 2/5. Colostomy drain/24h~435 cc, L abd drain~1010 cc, +3L. Pt noted to be at risk for abdominal compartment syndrome -- reducing TPN rate today will also help  with volume control. Endo: No noted hx DM - CBGs/12h: 259-298. Hyperglycemia likely related to multiple factors including TPN, dextrose containing fluids, critical illness and steroid administration (HC 50 mg/6h). Will reduce TPN rate today until CBGs are better controlled and increase insulin R in TPN bag to 25 units. The nurse is aware that if the patient does require start of insulin drip today -- to re-start insulin drip titration from zero whenever new TPN bag is hung this evening due to higher amounts of insulin in the TPN bag. Lytes: Na 134, K 4.7, Phos 2.6 << 1.7 (s/p repletion with K-phos 20 mmol IV x 1 on 2/6), Mg 2.6, CoCa~8.7.  Renal: AKI, resolving. SCr 1.23 << 1.38, CrCl~70-80 ml/min (normalized). UOP/24h: 0.5 ml/kg/hr, I/): 7327/4307 >> ~+3L. Pulm: VDRF. 96% on Fio2  60% Cards: Hypotensive requiring pressors for support. Vasopressin weaned off. Continues on PE at 175 mcg/min, vasopressin BP soft-wnl, HR elevated (Afib - new onset this admit). Amiodarone drip added.  Hepatobil: LFTs trended down to wnl 28/15 << 38/15. TG 101 (2/6), pre-albumin~3 (2/6), alb 1.4.  Neuro: Sedated, GCS 11, RASS -2 (goal 0) - prn pain/sedation ID: Flagyl IV  + Vanc po D#5 (2/3 >> current) for CDiff colitis now s/p colectomy/ileostomy. Vanc IV (2/3 >> current) + Aztreonam (2/6 >> current) for empiric sepsis coverage. Tmax/24h: 99.1, WBC 35.3 << 35.6.  Best Practices: SCDs, PPI IV, MC TPN Access: PICC (placed 02/28/12), L-IJ CVC (place 03/01/12) TPN day#: 3 (2/5 >> current)  Plan:  1. Decrease Clinimix E 5/15 to 83 ml/hr until CBGs are better controlled -- then will attempt to increase to meet RD protein goals 2. Lipids, MVI, TE on MWF only d/t national shortage 3. Increase insulin in TPN to 25 units  4. F/u TPN labs  Alycia Rossetti, PharmD, BCPS Clinical Pharmacist Pager: [phone] 03/02/2012 7:45 AM

## 2012-03-02 NOTE — H&P (Addendum)
PULMONARY  / CRITICAL CARE MEDICINE  Name: Connor Small MRN: [id number] DOB: 02-Dec-1960    ADMISSION DATE:  02/26/2012 CONSULTATION DATE:  2/5  REFERRING MD :  Karilyn Cota  CHIEF COMPLAINT:  Acute resp failure   BRIEF PATIENT DESCRIPTION:  49 YOM admitted to [facility] for Cdiff on 2/2. Underwent decompressive cecostomy 2/3, initially was better, then worsened on 2/4 with increased abdominal distension and tenderness, decreased renal function.  He was taken back to the OR on 2/4 for total abdominal colectomy and end ileostomy.  Post op he developed ARDS and sepsis shock and was being treated for this.  The morning of 2/6 he had new onset of A-fib and transferred to [facility].    SIGNIFICANT EVENTS / STUDIES:  2/2 - Admit to [facility] with abdomen pain 2/3 - Decompressive Cecostomy 2/4 - Total abdominal colectomy and end ileostomy for toxic megacolon 2/6 - New A-fib and transfer to [place]   LINES / TUBES: ETT - OSH 2/4>>> Foley-OSH 2/3>>> PICC OSH 2/4>>> Art Line - OSH 2/5>>>  CULTURES: BCx2 2/4>>> BCx2 2/3>>> UC 2/3- Negative MRSA PCR 2/3- Negative C diff 2/6>>>Positive   ANTIBIOTICS: Flagyl 2/3>>> Vanc 2/6 (IV) >>>2/7 azactam 2/6>>>2/7 Oral vanc (ok'd by surg) 2/6>>>  SUBJECTIVE:  Remains on pressors and increase FiO2/PEEP.  VITAL SIGNS: Temp:  [97 F (36.1 C)-99.1 F (37.3 C)] 97 F (36.1 C) (02/07 0844) Pulse Rate:  [86-143] 127  (02/07 1100) Resp:  [16-31] 23  (02/07 1100) BP: (91-128)/(56-86) 105/73 mmHg (02/07 1100) SpO2:  [93 %-98 %] 94 % (02/07 1100) Arterial Line BP: (68-132)/(56-79) 97/60 mmHg (02/07 1100) FiO2 (%):  [50 %-60 %] 50 % (02/07 0814) Weight:  [242 lb 15.2 oz (110.2 kg)] 242 lb 15.2 oz (110.2 kg) (02/06 1200) HEMODYNAMICS: CVP:  [10 mmHg-12 mmHg] 12 mmHg VENTILATOR SETTINGS: Vent Mode:  [-] PRVC FiO2 (%):  [50 %-60 %] 50 % Set Rate:  [24 bmp] 24 bmp Vt Set:  [420 mL-440 mL] 420 mL PEEP:  [10 cmH20] 10 cmH20 Plateau Pressure:  [18  cmH20-26 cmH20] 22 cmH20 INTAKE / OUTPUT: Intake/Output      02/06 0701 - 02/07 0700 02/07 0701 - 02/08 0700   I.V. (mL/kg) 3403.8 (30.9) 104.3 (0.9)   Other  415   IV Piggyback 1088    TPN 1960 100   Total Intake(mL/kg) 6451.8 (58.5) 619.3 (5.6)   Urine (mL/kg/hr) 1325 (0.5) 960 (2)   Emesis/NG output     Drains 1360    Stool 585    Total Output 3270 960   Net +3181.8 -340.7          PHYSICAL EXAMINATION: General:  Ill appearing ventilated patient Neuro: Sedated HEENT: ETT in place Cardiovascular: irregular Lungs: b/l rhonchi/rales Abdomen: Distended, wound site clean Musculoskeletal: 2+ edema Skin: no rashes  LABS:  Lab 03/02/12 0500 03/02/12 0042 03/01/12 1725 03/01/12 1218 03/01/12 0827 03/01/12 0514 03/01/12 0508 02/29/12 0452 02/29/12 0439 02/28/12 2352 02/28/12 1924 02/28/12 1244 02/26/12 1409  HGB 12.6* 11.9* -- -- -- 11.3* -- -- -- -- -- -- --  WBC 35.3* 35.6* -- -- -- 33.0* -- -- -- -- -- -- --  PLT 74* 76* -- -- -- 93* -- -- -- -- -- -- --  NA 134* 135 -- -- -- 137 -- -- -- -- -- -- --  K 4.7 4.6 -- -- -- -- -- -- -- -- -- -- --  CL 106 106 -- -- -- 106 -- -- -- -- -- -- --  CO2 22 22 -- -- -- 24 -- -- -- -- -- -- --  GLUCOSE 333* 315* -- -- -- 215* -- -- -- -- -- -- --  BUN 40* 39* -- -- -- 40* -- -- -- -- -- -- --  CREATININE 1.23 1.21 -- -- -- 1.38* -- -- -- -- -- -- --  CALCIUM 6.6* 6.4* -- -- -- 6.6* -- -- -- -- -- -- --  MG 2.6* 2.6* -- -- -- 2.4 -- -- -- -- -- -- --  PHOS 2.6 -- -- -- -- 1.7* -- -- -- 2.2* -- -- --  AST -- 28 -- -- -- 38* -- -- 41* -- -- -- --  ALT -- 15 -- -- -- 17 -- -- 16 -- -- -- --  ALKPHOS -- 80 -- -- -- 54 -- -- 37* -- -- -- --  BILITOT -- 0.7 -- -- -- 0.6 -- -- 0.3 -- -- -- --  PROT -- 3.8* -- -- -- 3.8* -- -- 3.0* -- -- -- --  ALBUMIN -- 1.4* -- -- -- 1.8* -- -- 1.0* -- -- -- --  APTT -- -- -- -- 36 -- -- -- -- -- -- -- --  INR -- -- -- -- 1.56* -- -- -- -- -- -- -- --  LATICACIDVEN -- -- -- -- -- -- -- 4.2* -- -- 3.6*  6.7* --  TROPONINI -- <0.30 -- -- -- -- -- -- -- -- -- -- <0.30  PROCALCITON -- -- -- -- -- -- 20.70 -- 31.67 -- 21.86 -- --  PROBNP -- -- -- -- -- -- -- -- -- -- -- -- --  O2SATVEN -- -- -- -- -- -- -- -- -- -- -- -- --  PHART -- 7.302* 7.349* 7.353 -- -- -- -- -- -- -- -- --  PCO2ART -- 47.5* 39.0 40.6 -- -- -- -- -- -- -- -- --  PO2ART -- 89.2 94.0 90.3 -- -- -- -- -- -- -- -- --    Lab 03/02/12 0834 03/02/12 0401 03/01/12 2340 03/01/12 2044 03/01/12 1638  GLUCAP 347* 290* 259* 298* 184*    Imaging: Dg Chest Port 1 View  03/01/2012  *RADIOLOGY REPORT*  Clinical Data: Post central line placement  PORTABLE CHEST - 1 VIEW  Comparison: 03/01/2012; 02/27/2012  Findings:  Grossly unchanged enlarged cardiac silhouette and mediastinal contours.  Replacement of left jugular approach of the venous catheter with tip projecting over the superior cavoatrial junction. Otherwise, stable position remain support apparatus.  No pneumothorax.  Lung volumes remain reduced with grossly unchanged moderate to large sized bilateral effusions, left greater than right. Pulmonary vasculature remains indistinct.  Grossly unchanged bones.  IMPRESSION: 1.  Interval placement of left jugular procedure venous catheter with tip overlying the superior cavoatrial junction.  Otherwise, stable position of remaining support apparatus.  No pneumothorax. 2.  Persistently reduced lung volumes with grossly unchanged findings of pulmonary edema and moderate to large sized bilateral effusions and bibasilar opacities, left greater than right.   Original Report Authenticated By: Tacey Ruiz, MD    Dg Chest Port 1 View  03/01/2012  *RADIOLOGY REPORT*  Clinical Data: Worsening respiratory failure.  PORTABLE CHEST - 1 VIEW  Comparison: 02/29/2012  Findings: Endotracheal tube tip measures 4.4 cm above the carina. Enteric tube tip remains in place with tip below the field of view of the image.  Right PICC catheter is unchanged in position with  tip over the cavoatrial junction.  Heart  size and pulmonary vascularity demonstrate mild increased.  There is increasing opacification of both mid and lower lungs, greatest on the left. This is likely due to combination of increasing pleural effusions and consolidation in the lungs.  No pneumothorax.  IMPRESSION: Increasing opacification in the lungs likely representing increasing pleural effusions and atelectasis or consolidation in the lungs.   Original Report Authenticated By: Burman Nieves, M.D.      ASSESSMENT / PLAN:  PULMONARY A: Acute respiratory failure in setting of septic shock and volume overload. P:   Full vent support F/u CXR Change BD's to prn  CARDIOVASCULAR A: Septic Shock - Secondary to C-diff colitis  Atrial Fibrillation with RVR. P: .  CVP goal 8 to 12 Pressors to keep SBP > 90, MAP > 65 Continue amiodarone Add heparin gtt 2/7  RENAL A:  Acute Renal Failure >> Likely from contrast dye, and ATN in setting of sepsis. Hypervolemia. 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 Hx of ulcerative colitis. Followed by Dr. Charna Elizabeth as outpt. Nutrition. P:   Post-op care per [facility] Continue TNA Change protonix to pepcid  HEMATOLOGIC A: Leukocytosis. Thrombocytopenia. P:  F/u CBC Monitor PLT while on heparin gtt  INFECTIOUS A: C diff colitis. P:   Continue enteral vancomycin and IV flagyl No evidence for additional infection >> D/c IV vancomycin, azactam  ENDOCRINE A: Hyperglycemia.  P:   SSI Add lantus 15 units 2/7  NEUROLOGIC A:  Acute encephalopathy 2nd to sepsis. P:   Sedation protocol while on vent  Updated family at bedside.   CC time 35 minutes.  Coralyn Helling, MD [facility] Pulmonary/Critical Care 03/02/2012, 11:22 AM Pager:  [phone] After 3pm call: [phone]

## 2012-03-02 NOTE — Progress Notes (Signed)
03/02/12 MD, please consider using the ICU hyperglycemia protocol at this time while patient is receiving Solucortef and TNA as it may take a bit of time for the insulin in the TNA to control glucose levels. Thank you, Rosita Kea, RN, CNS, Diabetes Coordinator [phone])

## 2012-03-02 NOTE — Progress Notes (Signed)
CRITICAL VALUE ALERT  Critical value received: Ca+  Date of notification:  03/02/2012   Time of notification:  2:06 AM   Critical value read back:yes  Nurse who received alert:  Caryl Pina RN  MD notified (1st page): Dr. Conception Chancy  Time of first page: 2:06 AM

## 2012-03-02 NOTE — Progress Notes (Signed)
Hypocalcemia   Corrected calcium 8.5; no further intervention

## 2012-03-02 NOTE — Progress Notes (Addendum)
3 Days Post-Op  Subjective: Sedated on ventilator.On high dose phenylephrine drip, TNA,,amiodarone, Steroids,Azactam, Flagyl, vancomycin, Protonix   Bladder pressure 27 mm mercury  Good output from ileostomy. Good urine output.  Gas exchange reasonable on FiO2 60%. Mild respiratory acidosis but no metabolic acidosis. Potassium 4.7, creatinine 1.23, glucose 333  Objective: Vital signs in last 24 hours: Temp:  [97.5 F (36.4 C)-99.1 F (37.3 C)] 98.2 F (36.8 C) (02/07 0406) Pulse Rate:  [53-143] 114  (02/07 0400) Resp:  [16-31] 24  (02/07 0400) BP: (91-128)/(56-86) 111/84 mmHg (02/07 0400) SpO2:  [93 %-100 %] 96 % (02/07 0400) Arterial Line BP: (68-132)/(56-79) 98/64 mmHg (02/07 0400) FiO2 (%):  [59.4 %-100 %] 60 % (02/07 0348) Weight:  [242 lb 15.2 oz (110.2 kg)] 242 lb 15.2 oz (110.2 kg) (02/06 1200) Last BM Date: 03/01/12 (Illeostomy)  Intake/Output from previous day: 02/06 0701 - 02/07 0700 In: 5554.6 [I.V.:2906.6; IV Piggyback:1088; TPN:1560] Out: 7939 [Urine:1325; Drains:1360; Stool:585] Intake/Output this shift:     Exam: General appearance: sedated on the vent. Skin warm and dry GI: abdomen distended, somewhat firm but not rigid. Midline wound opened, all staples removed, fascia intact. Packed open. JP drains are thin serosanguineous, no enteric drainage. Ileostomy examined directly. It is purplish at the skin level and also seems purplish in the subcutaneous tissue. Reasonable turgor, no retraction.  Lab Results:   Basename 03/02/12 0500 03/02/12 0042  WBC 35.3* 35.6*  HGB 12.6* 11.9*  HCT 36.0* 34.5*  PLT 74* 76*   BMET  Basename 03/02/12 0500 03/02/12 0042  NA 134* 135  K 4.7 4.6  CL 106 106  CO2 22 22  GLUCOSE 333* 315*  BUN 40* 39*  CREATININE 1.23 1.21  CALCIUM 6.6* 6.4*   PT/INR  Basename 03/01/12 0827  LABPROT 18.2*  INR 1.56*   ABG  Basename 03/02/12 0042 03/01/12 1725  PHART 7.302* 7.349*  HCO3 22.7 21.4    Studies/Results: Dg  Chest Port 1 View  03/01/2012  *RADIOLOGY REPORT*  Clinical Data: Post central line placement  PORTABLE CHEST - 1 VIEW  Comparison: 03/01/2012; 02/27/2012  Findings:  Grossly unchanged enlarged cardiac silhouette and mediastinal contours.  Replacement of left jugular approach of the venous catheter with tip projecting over the superior cavoatrial junction. Otherwise, stable position remain support apparatus.  No pneumothorax.  Lung volumes remain reduced with grossly unchanged moderate to large sized bilateral effusions, left greater than right. Pulmonary vasculature remains indistinct.  Grossly unchanged bones.  IMPRESSION: 1.  Interval placement of left jugular procedure venous catheter with tip overlying the superior cavoatrial junction.  Otherwise, stable position of remaining support apparatus.  No pneumothorax. 2.  Persistently reduced lung volumes with grossly unchanged findings of pulmonary edema and moderate to large sized bilateral effusions and bibasilar opacities, left greater than right.   Original Report Authenticated By: Jake Seats, MD    Dg Chest Port 1 View  03/01/2012  *RADIOLOGY REPORT*  Clinical Data: Worsening respiratory failure.  PORTABLE CHEST - 1 VIEW  Comparison: 02/29/2012  Findings: Endotracheal tube tip measures 4.4 cm above the carina. Enteric tube tip remains in place with tip below the field of view of the image.  Right PICC catheter is unchanged in position with tip over the cavoatrial junction.  Heart size and pulmonary vascularity demonstrate mild increased.  There is increasing opacification of both mid and lower lungs, greatest on the left. This is likely due to combination of increasing pleural effusions and consolidation in the lungs.  No pneumothorax.  IMPRESSION: Increasing opacification in the lungs likely representing increasing pleural effusions and atelectasis or consolidation in the lungs.   Original Report Authenticated By: Lucienne Capers, M.D.      Anti-infectives: Anti-infectives     Start     Dose/Rate Route Frequency Ordered Stop   03/01/12 1445   vancomycin (VANCOCIN) 50 mg/mL oral solution 500 mg        500 mg Oral 3 times per day 03/01/12 1340     03/01/12 1400   aztreonam (AZACTAM) 1 g in dextrose 5 % 50 mL IVPB        1 g 100 mL/hr over 30 Minutes Intravenous 3 times per day 03/01/12 1356     03/01/12 1300   vancomycin (VANCOCIN) 1,250 mg in sodium chloride 0.9 % 250 mL IVPB        1,250 mg 166.7 mL/hr over 90 Minutes Intravenous Every 12 hours 03/01/12 1127     02/29/12 0600   ciprofloxacin (CIPRO) IVPB 400 mg        400 mg 200 mL/hr over 60 Minutes Intravenous On call to O.R. 02/28/12 1409 02/28/12 1438   02/28/12 2200   vancomycin (VANCOCIN) 500 mg in sodium chloride irrigation 0.9 % 60 mL ENEMA  Status:  Discontinued        500 mg Rectal 2 times daily 02/28/12 1504 02/28/12 1817   02/28/12 1200   vancomycin (VANCOCIN) 50 mg/mL oral solution 500 mg  Status:  Discontinued     Comments: First dose ASAP      500 mg Per Tube 4 times per day 02/28/12 0814 02/28/12 1817   02/28/12 1000   vancomycin (VANCOCIN) IVPB 1000 mg/200 mL premix  Status:  Discontinued        1,000 mg 200 mL/hr over 60 Minutes Intravenous Every 12 hours 02/28/12 0845 03/01/12 1127   02/28/12 1000   vancomycin (VANCOCIN) 500 mg in sodium chloride irrigation 0.9 % 100 mL ENEMA  Status:  Discontinued        500 mg Rectal 2 times daily 02/28/12 0923 02/28/12 1504   02/27/12 1600   metroNIDAZOLE (FLAGYL) IVPB 500 mg  Status:  Discontinued        500 mg 100 mL/hr over 60 Minutes Intravenous Every 8 hours 02/27/12 1546 02/27/12 2000   02/27/12 1600   ciprofloxacin (CIPRO) IVPB 400 mg  Status:  Discontinued        400 mg 200 mL/hr over 60 Minutes Intravenous Every 12 hours 02/27/12 1547 02/27/12 2000   02/27/12 1545   metroNIDAZOLE (FLAGYL) IVPB 500 mg  Status:  Discontinued        500 mg 100 mL/hr over 60 Minutes Intravenous  Once 02/27/12  1535 02/28/12 0843   02/27/12 1545   vancomycin (VANCOCIN) 500 mg in sodium chloride 0.9 % 100 mL IVPB  Status:  Discontinued        500 mg 100 mL/hr over 60 Minutes Intravenous  Once 02/27/12 1535 02/27/12 1538   02/27/12 1545   ciprofloxacin (CIPRO) IVPB 400 mg  Status:  Discontinued        400 mg 200 mL/hr over 60 Minutes Intravenous  Once 02/27/12 1538 02/28/12 0843   02/27/12 1415   vancomycin (VANCOCIN) 500 mg in sodium chloride 0.9 % 100 mL IVPB        500 mg 100 mL/hr over 60 Minutes Intravenous  Once 02/27/12 1402 02/27/12 1411   02/27/12 1400   vancomycin (VANCOCIN) powder 500 mg  Status:  Discontinued        500 mg Other To Surgery 02/27/12 1359 02/27/12 1402   02/27/12 1200   metroNIDAZOLE (FLAGYL) IVPB 500 mg     Comments: First dose ASAP      500 mg 100 mL/hr over 60 Minutes Intravenous Every 6 hours 02/27/12 0953     02/27/12 1200   vancomycin (VANCOCIN) 50 mg/mL oral solution 250 mg  Status:  Discontinued     Comments: First dose ASAP      250 mg Oral 4 times per day 02/27/12 0953 02/28/12 0814   02/26/12 1700   oseltamivir (TAMIFLU) capsule 75 mg        75 mg Oral  Once 02/26/12 1651 02/26/12 1715          Assessment/Plan: s/p Procedure(s): TOTAL COLECTOMY ILEOSTOMY  POD #3, subtotal colectomy and ileostomy for fulminating colitis Dr. Harrison Mons) This is C. Difficile superimposed on chronic ulcerative colitis, with history segmental left colon resection at [facility] in the past.  POD #4, laparotomy and surgical cecostomy (Dr. Harrison Mons)  There is no evidence of abdominal compartment syndrome at this time, Although he is at risk for this should mesenteric edema progress  There is no evidence of abdominal wound dehiscence at this time.  The ileostomy is ischemic but still draining well without evidence of obstruction, perforation or retraction. I suspect this will have to be revised at some point although it is not emergently necessary. Considering his  septic shock and cardiac issues, would postpone decision on this on a day to day basis..  I discussed and summarize all the surgical issues with the patient's wife and daughter. They understand my philosophy of care and that we will make decisions on a day-to-day basis.   LOS: 5 days    Monaye Blackie M. Dalbert Batman, M.D., [facility] General and Minimally invasive Surgery Breast and Colorectal Surgery Office:   [phone] Pager:   [phone]  03/02/2012

## 2012-03-03 ENCOUNTER — Inpatient Hospital Stay (HOSPITAL_COMMUNITY): Payer: BC Managed Care – PPO

## 2012-03-03 LAB — BASIC METABOLIC PANEL
Calcium: 6.9 mg/dL — ABNORMAL LOW (ref 8.4–10.5)
GFR calc non Af Amer: 65 mL/min — ABNORMAL LOW (ref >90)
Sodium: 135 mEq/L (ref 135–145)

## 2012-03-03 LAB — GLUCOSE, CAPILLARY
Glucose-Capillary: 272 mg/dL — ABNORMAL HIGH (ref 70–99)
Glucose-Capillary: 300 mg/dL — ABNORMAL HIGH (ref 70–99)
Glucose-Capillary: 228 mg/dL — ABNORMAL HIGH (ref 70–99)
Glucose-Capillary: 180 mg/dL — ABNORMAL HIGH (ref 70–99)
Glucose-Capillary: 185 mg/dL — ABNORMAL HIGH (ref 70–99)

## 2012-03-03 LAB — CBC
MCH: 32.3 pg (ref 26.0–34.0)
MCHC: 33.9 g/dL (ref 30.0–36.0)
Platelets: 94 10*3/uL — ABNORMAL LOW (ref 150–400)
RBC: 4.18 MIL/uL — ABNORMAL LOW (ref 4.22–5.81)

## 2012-03-03 MED ORDER — DEXTROSE 5 % IV SOLN
INTRAVENOUS | Status: AC
  Administered 2012-03-03: 08:00:00 via INTRAVENOUS

## 2012-03-03 MED ORDER — INSULIN REGULAR HUMAN 100 UNIT/ML IJ SOLN
INTRAVENOUS | Status: DC
  Administered 2012-03-03: 18:00:00 via INTRAVENOUS
  Filled 2012-03-03: qty 2000

## 2012-03-03 MED ORDER — FUROSEMIDE 10 MG/ML IJ SOLN
40.0000 mg | Freq: Once | INTRAMUSCULAR | Status: AC
  Administered 2012-03-03: 40 mg via INTRAVENOUS
  Filled 2012-03-03: qty 4

## 2012-03-03 MED ORDER — INSULIN GLARGINE 100 UNIT/ML ~~LOC~~ SOLN
10.0000 [IU] | Freq: Every day | SUBCUTANEOUS | Status: DC
  Administered 2012-03-03 – 2012-03-06 (×4): 10 [IU] via SUBCUTANEOUS

## 2012-03-03 MED ORDER — HYDROCORTISONE SOD SUCCINATE 100 MG IJ SOLR
50.0000 mg | Freq: Three times a day (TID) | INTRAMUSCULAR | Status: DC
  Administered 2012-03-03 – 2012-03-04 (×3): 50 mg via INTRAVENOUS
  Filled 2012-03-03 (×6): qty 1

## 2012-03-03 NOTE — Progress Notes (Addendum)
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 Penicillins Other (See Comments)    Heart rate changes    Patient Measurements: Height: 5' 9"  (175.3 cm) Weight: 242 lb 15.2 oz (110.2 kg) IBW/kg (Calculated) : 70.7  Vital Signs: Temp: 98.9 F (37.2 C) (02/08 0343) Temp src: Oral (02/08 0343) BP: 105/69 mmHg (02/08 0600) Pulse Rate: 116 (02/08 0600) Intake/Output from previous day: 02/07 0701 - 02/08 0700 In: 4029.4 [I.V.:1199.4; TPN:2020] Out: 3860 [Urine:2860; Drains:1000] Intake/Output from this shift:    Labs:  Recent Labs  03/01/12 0514 03/01/12 0827 03/02/12 0042 03/02/12 0500 03/03/12 0421  WBC 33.0*  --  35.6* 35.3* 27.6*  HGB 11.3*  --  11.9* 12.6* 13.5  HCT 32.1*  --  34.5* 36.0* 39.8  PLT 93*  --  76* 74* 94*  APTT  --  36  --   --   --   INR  --  1.56*  --   --   --      Recent Labs  03/01/12 0509  03/01/12 0514 03/02/12 0042 03/02/12 0500 03/03/12 0421  NA  --   < > 137 135 134* 135  K  --   < > 3.4* 4.6 4.7 5.2*  CL  --   < > 106 106 106 105  CO2  --   < > 24 22 22 25   GLUCOSE  --   < > 215* 315* 333* 329*  BUN  --   < > 40* 39* 40* 52*  CREATININE  --   < > 1.38* 1.21 1.23 1.24  CALCIUM  --   < > 6.6* 6.4* 6.6* 6.9*  MG  --   --  2.4 2.6* 2.6*  --   PHOS  --   --  1.7*  --  2.6  --   PROT  --   --  3.8* 3.8*  --   --   ALBUMIN  --   --  1.8* 1.4*  --   --   AST  --   --  38* 28  --   --   ALT  --   --  17 15  --   --   ALKPHOS  --   --  54 80  --   --   BILITOT  --   --  0.6 0.7  --   --   PREALBUMIN  --   --  3.0*  --   --   --   TRIG 101  --   --   --   --   --   CHOL 39  --   --   --   --   --   < > = values in this interval not displayed. Estimated Creatinine Clearance: 84.3 ml/min (by C-G formula based on Cr of 1.24).    Recent Labs  03/02/12 2014 03/03/12 0022 03/03/12 0343  GLUCAP  290* 272* 300*   Insulin Requirements in the past 12 hours:  25 units insulin R in TPN + 52 units Novolog with SSI   Nutritional Goals:  Underfeeding goal: 8341-9622 kcal/day (60-70% of full goal of 2276 kcal/day), >/= 130g protein per day (90% of full goal of >/= 145 grams per day), >/= 2L fluid/day per RD recommendations on 03/01/12   Current Nutrition:  Clinimix E5/15 at 83 ml/hr provides 100 gm protein/day (~  71% goal) and an average of 1619kcal/day (101% underfeeding goal range)  Assessment: 52 y.o. M with history of ulcerative colitis presented to the hospital with back pain, fever and diarrhea. 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 TPN was initiated on 02/29/12 for nutrition support. Transferred to [facility] on 2/6 for continued management of care.   GI: Hx ulcerative colitis - previously in remission with mesalamine - developed toxic megacolon d/t cdiff colitis. Failed medical management, now POD#4 total colectomy. Started on TPN 2/5. 1L abd. No evidence of abdominal compartment syndrome per surgery but is at risk.  Endo: No noted hx DM - CBGs/12h: 272-329. Hyperglycemia likely related to multiple factors including TPN, dextrose containing fluids, critical illness and steroid administration (HC 50 mg/6h).  Lytes: Na 135, K 5.2, Phos 2.6 << 1.7 (s/p repletion with K-phos 20 mmol IV x 1 on 2/6), Mg 2.6, CoCa~8.98.  Renal: AKI, resolving. SCr down to 1.24, CrCl~84 ml/min (normalized). UOP/24h: 1.1 ml/kg/hr Pulm: VDRF. 94% on Fio2 50% Cards: Hypotensive requiring pressors for support. Vasopressin + phenylephrine weaned off. BP 105/69, HR 116 (Afib - new onset this admit). Amiodarone drip added.  Hepatobil: LFTs WNL. TG 101 (2/6), pre-albumin~3 (2/6), alb 1.4.  Neuro: Sedated, GCS 8, RASS -3 (goal 0) - prn pain/sedation ID: Flagyl IV  + Vanc po D#6 (2/3 >> current) for CDiff colitis now s/p  colectomy/ileostomy. Vanc IV + aztreonam dc'd. Afebrile, WBC down to 127.6  Best Practices: SCDs, PPI IV, MC TPN Access: PICC (placed 02/28/12), L-IJ CVC (place 03/01/12) TPN day#: 4 (2/5 >> current)  Plan:  1. Stop current TPN d/t hyperkalemia. Want to avoid giving additional potassium throughout today.   2. Start  D5 at 69m/hr until next TPN starts (not hanging D10 as protocol indicates as pt has been significantly hyperglycemic) 3. Start clinimix 5/15 at 853mhr tonight at 1800. Will try to titrate up to meet protein goals as glucose and volume will allow. 4. Lipids, MVI, TE on MWF only d/t national shortage 5. Will monitor CBGs throughout the day to determine insulin requirements for TPN tonight. Consider an insulin gtt to better control uncontrolled hyperglycemia.  6. F/u AM TPN labs  RaSalome ArntPharmD, BCPS Pager # [phone]/08/2012 7:38 AM  Addendum: Start clinimix 5/15 at 8352mr with 40 units regular insulin tonight. Of note, MD started lantus 10 units daily. Recommend stopping lantus and managing insulin via TPN to simplify monitoring and adjustments.   RacSalome ArntharmD, BCPS Pager # [phone]/2014 10:59 AM

## 2012-03-03 NOTE — Progress Notes (Signed)
PULMONARY  / CRITICAL CARE MEDICINE  Name: Connor Small MRN: [id number] DOB: [birth year]    ADMISSION DATE:  02/26/2012 CONSULTATION DATE:  2/5  REFERRING MD :  Karilyn Cota  CHIEF COMPLAINT:  Acute resp failure   BRIEF PATIENT DESCRIPTION:  [name] admitted to [facility] for Cdiff on 2/2. Underwent decompressive cecostomy 2/3, initially was better, then worsened on 2/4 with increased abdominal distension and tenderness, decreased renal function.  He was taken back to the OR on 2/4 for total abdominal colectomy and end ileostomy.  Post op he developed ARDS and sepsis shock and was being treated for this.  The morning of 2/6 he had new onset of A-fib and transferred to [facility].    SIGNIFICANT EVENTS / STUDIES:  2/2 - Admit to [facility] with abdomen pain 2/3 - Decompressive Cecostomy 2/4 - Total abdominal colectomy and end ileostomy for toxic megacolon 2/6 - New A-fib and transfer to [facility]   LINES / TUBES: ETT - OSH 2/4>>> Foley-OSH 2/3>>> PICC OSH 2/4>>> Art Line - OSH 2/5>>>  CULTURES: BCx2 2/4>>> BCx2 2/3>>> UC 2/3- Negative MRSA PCR 2/3- Negative C diff 2/6>>>Positive   ANTIBIOTICS: Flagyl 2/3>>> Vanc 2/6 (IV) >>>2/7 azactam 2/6>>>2/7 Oral vanc (ok'd by surg) 2/6>>>  SUBJECTIVE:  Off pressors.  VITAL SIGNS: Temp:  [97.5 F (36.4 C)-99 F (37.2 C)] 99 F (37.2 C) (02/08 0751) Pulse Rate:  [66-143] 116 (02/08 0900) Resp:  [18-31] 25 (02/08 0900) BP: (99-125)/(63-82) 103/65 mmHg (02/08 0900) SpO2:  [92 %-95 %] 94 % (02/08 0900) Arterial Line BP: (89-109)/(57-67) 103/60 mmHg (02/07 1530) FiO2 (%):  [50 %] 50 % (02/08 0809) HEMODYNAMICS: CVP:  [6 mmHg-7 mmHg] 7 mmHg VENTILATOR SETTINGS: Vent Mode:  [-] PRVC FiO2 (%):  [50 %] 50 % Set Rate:  [24 bmp] 24 bmp Vt Set:  [420 mL] 420 mL PEEP:  [10 cmH20] 10 cmH20 Plateau Pressure:  [16 cmH20-27 cmH20] 22 cmH20 INTAKE / OUTPUT: Intake/Output     02/07 0701 - 02/08 0700 02/08 0701 - 02/09 0700   I.V. (mL/kg) 1235.6  (11.2) 163.4 (1.5)   Other 810    IV Piggyback     TPN 2030 20   Total Intake(mL/kg) 4075.6 (37) 183.4 (1.7)   Urine (mL/kg/hr) 2860 (1.1) 200 (0.7)   Drains 1000 (0.4) 200 (0.7)   Stool     Total Output 3860 400   Net +215.6 -216.6          PHYSICAL EXAMINATION: General:  Ill appearing ventilated patient Neuro: Sedated HEENT: ETT in place Cardiovascular: irregular Lungs: b/l rhonchi/rales Abdomen: Distended, wound site clean Musculoskeletal: 2+ edema Skin: no rashes  LABS:  Recent Labs Lab 02/26/12 1409  02/28/12 1244  02/28/12 1924  02/28/12 2352 02/29/12 0439 02/29/12 0452  03/01/12 0455 03/01/12 0508 03/01/12 0514 03/01/12 0827 03/01/12 1218 03/01/12 1725 03/02/12 0042 03/02/12 0500 03/03/12 0421  HGB 17.2*  < >  --   --  13.7  --   --  14.5  --   --   --   --  11.3*  --   --   --  11.9* 12.6* 13.5  WBC 13.8*  < >  --   --  3.9*  --   --  14.2*  --   --   --   --  33.0*  --   --   --  35.6* 35.3* 27.6*  PLT 134*  < >  --   --  124*  --   --  113*  --   --   --   --  93*  --   --   --  76* 74* 94*  NA 135  < >  --   --  138  --  134* 136  --   --   --   --  137  --   --   --  135 134* 135  K 3.7  < >  --   < > 5.0  --  3.9 3.6  --   --   --   --  3.4*  --   --   --  4.6 4.7 5.2*  CL 101  < >  --   --  117*  --  112 111  --   --   --   --  106  --   --   --  106 106 105  CO2 20  < >  --   --  15*  --  15* 15*  --   --   --   --  24  --   --   --  22 22 25   GLUCOSE 114*  < >  --   --  117*  --  188* 165*  --   --   --   --  215*  --   --   --  315* 333* 329*  BUN 27*  < >  --   --  37*  --  39* 39*  --   --   --   --  40*  --   --   --  39* 40* 52*  CREATININE 1.15  < >  --   --  1.90*  --  1.84* 1.78*  --   --   --   --  1.38*  --   --   --  1.21 1.23 1.24  CALCIUM 8.4  < >  --   --  5.7*  --  5.9* 6.3*  --   --   --   --  6.6*  --   --   --  6.4* 6.6* 6.9*  MG  --   --   --   --   --   --   --   --   --   --   --   --  2.4  --   --   --  2.6* 2.6*  --   PHOS   --   --   --   --   --   --  2.2*  --   --   --   --   --  1.7*  --   --   --   --  2.6  --   AST 14  < >  --   --  24  --  32 41*  --   --   --   --  38*  --   --   --  28  --   --   ALT 14  < >  --   --  10  --  12 16  --   --   --   --  17  --   --   --  15  --   --   ALKPHOS 59  < >  --   --  19*  --  19* 37*  --   --   --   --  54  --   --   --  80  --   --   BILITOT 1.3*  < >  --   --  0.2*  --  0.4 0.3  --   --   --   --  0.6  --   --   --  0.7  --   --   PROT 6.4  < >  --   --  2.3*  --  2.7* 3.0*  --   --   --   --  3.8*  --   --   --  3.8*  --   --   ALBUMIN 3.1*  < >  --   --  0.7*  --  1.0* 1.0*  --   --   --   --  1.8*  --   --   --  1.4*  --   --   APTT  --   --   --   --   --   --   --   --   --   --   --   --   --  36  --   --   --   --   --   INR  --   --   --   --   --   --   --   --   --   --   --   --   --  1.56*  --   --   --   --   --   LATICACIDVEN 2.0  < > 6.7*  --  3.6*  --   --   --  4.2*  --   --   --   --   --   --   --   --   --   --   TROPONINI <0.30  --   --   --   --   --   --   --   --   --   --   --   --   --   --   --  <0.30  --   --   PROCALCITON  --   --   --   --  21.86  --   --  31.67  --   --   --  20.70  --   --   --   --   --   --   --   PHART  --   < >  --   --   --   < >  --   --   --   < > 7.366  --   --   --  7.353 7.349* 7.302*  --   --   PCO2ART  --   < >  --   --   --   < >  --   --   --   < > 41.6  --   --   --  40.6 39.0 47.5*  --   --   PO2ART  --   < >  --   --   --   < >  --   --   --   < > 112.0*  --   --   --  90.3 94.0 89.2  --   --   < > = values in this interval not displayed.  Recent Labs Lab 03/02/12 [phone]  03/02/12 2014 03/03/12 0022 03/03/12 0343 03/03/12 0744  GLUCAP 318* 290* 272* 300* 291*    Imaging: Dg Chest Port 1 View  03/03/2012  *RADIOLOGY REPORT*  Clinical Data: 52 year old male with pleural effusion.  PORTABLE CHEST - 1 VIEW  Comparison: 03/01/2012 and earlier.  Findings: Semi upright AP portable view at 0635 hours.   Stable endotracheal tube.  Stable visible enteric tube.  Stable right PICC line.  Larger lung volumes, but continued left greater than right pleural effusions with extensive bilateral veiling opacity.  No pneumothorax.  No definite edema.  Right lower lobe and widespread left lung collapse or consolidation.  IMPRESSION: 1. Stable lines and tubes. 2. Improved lung volumes.  Moderate to large left pleural effusion and small to moderate right pleural effusion persist with significant associated lung collapse / consolidation.   Original Report Authenticated By: Erskine Speed, M.D.    Dg Chest Port 1 View  03/01/2012  *RADIOLOGY REPORT*  Clinical Data: Post central line placement  PORTABLE CHEST - 1 VIEW  Comparison: 03/01/2012; 02/27/2012  Findings:  Grossly unchanged enlarged cardiac silhouette and mediastinal contours.  Replacement of left jugular approach of the venous catheter with tip projecting over the superior cavoatrial junction. Otherwise, stable position remain support apparatus.  No pneumothorax.  Lung volumes remain reduced with grossly unchanged moderate to large sized bilateral effusions, left greater than right. Pulmonary vasculature remains indistinct.  Grossly unchanged bones.  IMPRESSION: 1.  Interval placement of left jugular procedure venous catheter with tip overlying the superior cavoatrial junction.  Otherwise, stable position of remaining support apparatus.  No pneumothorax. 2.  Persistently reduced lung volumes with grossly unchanged findings of pulmonary edema and moderate to large sized bilateral effusions and bibasilar opacities, left greater than right.   Original Report Authenticated By: Tacey Ruiz, MD      ASSESSMENT / PLAN:  PULMONARY A: Acute respiratory failure in setting of septic shock and volume overload. P:   Full vent support F/u CXR BD's prn  CARDIOVASCULAR A: Septic Shock - Secondary to C-diff colitis; off pressors 2/08. Atrial Fibrillation with RVR. Low [place]  score. P: .  CVP goal 8 to 12 Continue amiodarone Add ASA when able to take enteral meds  RENAL A:  Acute Renal Failure >> Likely from contrast dye, and ATN in setting of sepsis. Hypervolemia. 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 Hx of ulcerative colitis. Followed by Dr. Charna Elizabeth as outpt. Nutrition. P:   Post-op care per [facility] Continue TNA SUP with pepcid  HEMATOLOGIC A: Leukocytosis. Thrombocytopenia. P:  F/u CBC  INFECTIOUS A: C diff colitis. P:   Continue enteral vancomycin and IV flagyl  ENDOCRINE A: Hyperglycemia.  Relative adrenal insufficiency. P:   SSI Lantus 10 units 2/8 Change solucortef to 50 mg q8h on 2/8  NEUROLOGIC A:  Acute encephalopathy 2nd to sepsis. P:   Sedation protocol while on vent  Updated family at bedside.   CC time 35 minutes.  Coralyn Helling, MD [facility] Pulmonary/Critical Care 03/03/2012, 9:34 AM Pager:  [phone] After 3pm call: [phone]

## 2012-03-03 NOTE — Progress Notes (Signed)
4 Days Post-Op  Subjective: Pt sedated on vent.  Family at bedside  Objective: Vital signs in last 24 hours: Temp:  [97.5 F (36.4 C)-99 F (37.2 C)] 99 F (37.2 C) (02/08 0751) Pulse Rate:  [66-143] 116 (02/08 0900) Resp:  [18-31] 25 (02/08 0900) BP: (99-125)/(63-82) 103/65 mmHg (02/08 0900) SpO2:  [92 %-95 %] 94 % (02/08 0900) Arterial Line BP: (89-109)/(57-67) 103/60 mmHg (02/07 1530) FiO2 (%):  [50 %] 50 % (02/08 0809) Last BM Date: 03/01/12 (Illeostomy)  Intake/Output from previous day: 02/07 0701 - 02/08 0700 In: 4075.6 [I.V.:1235.6; TPN:2030] Out: 3860 [Urine:2860; Drains:1000] Intake/Output this shift: Total I/O In: 223.6 [I.V.:193.6; TPN:30] Out: 430 [Urine:200; Drains:230]  Incision/Wound:wound open clean  Ostomy dark but viable and functioning  Lab Results:   Recent Labs  03/02/12 0500 03/03/12 0421  WBC 35.3* 27.6*  HGB 12.6* 13.5  HCT 36.0* 39.8  PLT 74* 94*   BMET  Recent Labs  03/02/12 0500 03/03/12 0421  NA 134* 135  K 4.7 5.2*  CL 106 105  CO2 22 25  GLUCOSE 333* 329*  BUN 40* 52*  CREATININE 1.23 1.24  CALCIUM 6.6* 6.9*   PT/INR  Recent Labs  03/01/12 0827  LABPROT 18.2*  INR 1.56*   ABG  Recent Labs  03/01/12 1725 03/02/12 0042  PHART 7.349* 7.302*  HCO3 21.4 22.7    Studies/Results: Dg Chest Port 1 View  03/03/2012  *RADIOLOGY REPORT*  Clinical Data: 52 year old male with pleural effusion.  PORTABLE CHEST - 1 VIEW  Comparison: 03/01/2012 and earlier.  Findings: Semi upright AP portable view at 0635 hours.  Stable endotracheal tube.  Stable visible enteric tube.  Stable right PICC line.  Larger lung volumes, but continued left greater than right pleural effusions with extensive bilateral veiling opacity.  No pneumothorax.  No definite edema.  Right lower lobe and widespread left lung collapse or consolidation.  IMPRESSION: 1. Stable lines and tubes. 2. Improved lung volumes.  Moderate to large left pleural effusion and small  to moderate right pleural effusion persist with significant associated lung collapse / consolidation.   Original Report Authenticated By: Roselyn Reef, M.D.    Dg Chest Port 1 View  03/01/2012  *RADIOLOGY REPORT*  Clinical Data: Post central line placement  PORTABLE CHEST - 1 VIEW  Comparison: 03/01/2012; 02/27/2012  Findings:  Grossly unchanged enlarged cardiac silhouette and mediastinal contours.  Replacement of left jugular approach of the venous catheter with tip projecting over the superior cavoatrial junction. Otherwise, stable position remain support apparatus.  No pneumothorax.  Lung volumes remain reduced with grossly unchanged moderate to large sized bilateral effusions, left greater than right. Pulmonary vasculature remains indistinct.  Grossly unchanged bones.  IMPRESSION: 1.  Interval placement of left jugular procedure venous catheter with tip overlying the superior cavoatrial junction.  Otherwise, stable position of remaining support apparatus.  No pneumothorax. 2.  Persistently reduced lung volumes with grossly unchanged findings of pulmonary edema and moderate to large sized bilateral effusions and bibasilar opacities, left greater than right.   Original Report Authenticated By: Jake Seats, MD     Anti-infectives: Anti-infectives   Start     Dose/Rate Route Frequency Ordered Stop   03/02/12 1200  vancomycin (VANCOCIN) 50 mg/mL oral solution 500 mg     500 mg Oral 4 times per day 03/02/12 0913     03/01/12 1445  vancomycin (VANCOCIN) 50 mg/mL oral solution 500 mg  Status:  Discontinued     500 mg Oral  3 times per day 03/01/12 1340 03/02/12 0913   03/01/12 1400  aztreonam (AZACTAM) 1 g in dextrose 5 % 50 mL IVPB  Status:  Discontinued     1 g 100 mL/hr over 30 Minutes Intravenous 3 times per day 03/01/12 1356 03/02/12 1146   03/01/12 1300  vancomycin (VANCOCIN) 1,250 mg in sodium chloride 0.9 % 250 mL IVPB  Status:  Discontinued     1,250 mg 166.7 mL/hr over 90 Minutes Intravenous  Every 12 hours 03/01/12 1127 03/02/12 1146   02/29/12 0600  ciprofloxacin (CIPRO) IVPB 400 mg     400 mg 200 mL/hr over 60 Minutes Intravenous On call to O.R. 02/28/12 1409 02/28/12 1438   02/28/12 2200  vancomycin (VANCOCIN) 500 mg in sodium chloride irrigation 0.9 % 60 mL ENEMA  Status:  Discontinued     500 mg Rectal 2 times daily 02/28/12 1504 02/28/12 1817   02/28/12 1200  vancomycin (VANCOCIN) 50 mg/mL oral solution 500 mg  Status:  Discontinued    Comments:  First dose ASAP   500 mg Per Tube 4 times per day 02/28/12 0814 02/28/12 1817   02/28/12 1000  vancomycin (VANCOCIN) IVPB 1000 mg/200 mL premix  Status:  Discontinued     1,000 mg 200 mL/hr over 60 Minutes Intravenous Every 12 hours 02/28/12 0845 03/01/12 1127   02/28/12 1000  vancomycin (VANCOCIN) 500 mg in sodium chloride irrigation 0.9 % 100 mL ENEMA  Status:  Discontinued     500 mg Rectal 2 times daily 02/28/12 0923 02/28/12 1504   02/27/12 1600  metroNIDAZOLE (FLAGYL) IVPB 500 mg  Status:  Discontinued     500 mg 100 mL/hr over 60 Minutes Intravenous Every 8 hours 02/27/12 1546 02/27/12 2000   02/27/12 1600  ciprofloxacin (CIPRO) IVPB 400 mg  Status:  Discontinued     400 mg 200 mL/hr over 60 Minutes Intravenous Every 12 hours 02/27/12 1547 02/27/12 2000   02/27/12 1545  metroNIDAZOLE (FLAGYL) IVPB 500 mg  Status:  Discontinued     500 mg 100 mL/hr over 60 Minutes Intravenous  Once 02/27/12 1535 02/28/12 0843   02/27/12 1545  vancomycin (VANCOCIN) 500 mg in sodium chloride 0.9 % 100 mL IVPB  Status:  Discontinued     500 mg 100 mL/hr over 60 Minutes Intravenous  Once 02/27/12 1535 02/27/12 1538   02/27/12 1545  ciprofloxacin (CIPRO) IVPB 400 mg  Status:  Discontinued     400 mg 200 mL/hr over 60 Minutes Intravenous  Once 02/27/12 1538 02/28/12 0843   02/27/12 1415  vancomycin (VANCOCIN) 500 mg in sodium chloride 0.9 % 100 mL IVPB     500 mg 100 mL/hr over 60 Minutes Intravenous  Once 02/27/12 1402 02/27/12 1411    02/27/12 1400  vancomycin (VANCOCIN) powder 500 mg  Status:  Discontinued     500 mg Other To Surgery 02/27/12 1359 02/27/12 1402   02/27/12 1200  metroNIDAZOLE (FLAGYL) IVPB 500 mg    Comments:  First dose ASAP   500 mg 100 mL/hr over 60 Minutes Intravenous Every 6 hours 02/27/12 0953     02/27/12 1200  vancomycin (VANCOCIN) 50 mg/mL oral solution 250 mg  Status:  Discontinued    Comments:  First dose ASAP   250 mg Oral 4 times per day 02/27/12 0953 02/28/12 0814   02/26/12 1700  oseltamivir (TAMIFLU) capsule 75 mg     75 mg Oral  Once 02/26/12 1651 02/26/12 1715      Assessment/Plan: s/p  Procedure(s): TOTAL COLECTOMY (N/A) ILEOSTOMY (N/A) Dressing changes.  Watch wound and ostomy Cont ABX CCM for critical issues  LOS: 6 days    Franchesca Veneziano A. 03/03/2012

## 2012-03-04 ENCOUNTER — Inpatient Hospital Stay (HOSPITAL_COMMUNITY): Payer: BC Managed Care – PPO

## 2012-03-04 DIAGNOSIS — J90 Pleural effusion, not elsewhere classified: Secondary | ICD-10-CM

## 2012-03-04 DIAGNOSIS — K5190 Ulcerative colitis, unspecified, without complications: Secondary | ICD-10-CM

## 2012-03-04 LAB — AMYLASE, BODY FLUID: Amylase, Fluid: 122 U/L

## 2012-03-04 LAB — CBC
HCT: 40.5 % (ref 39.0–52.0)
Hemoglobin: 13.7 g/dL (ref 13.0–17.0)
MCH: 32.5 pg (ref 26.0–34.0)
MCHC: 33.8 g/dL (ref 30.0–36.0)
MCV: 96 fL (ref 78.0–100.0)
Platelets: 92 K/uL — ABNORMAL LOW (ref 150–400)
RBC: 4.22 MIL/uL (ref 4.22–5.81)
RDW: 16.5 % — ABNORMAL HIGH (ref 11.5–15.5)
WBC: 31.9 K/uL — ABNORMAL HIGH (ref 4.0–10.5)

## 2012-03-04 LAB — GLUCOSE, CAPILLARY
Glucose-Capillary: 199 mg/dL — ABNORMAL HIGH (ref 70–99)
Glucose-Capillary: 199 mg/dL — ABNORMAL HIGH (ref 70–99)
Glucose-Capillary: 220 mg/dL — ABNORMAL HIGH (ref 70–99)
Glucose-Capillary: 228 mg/dL — ABNORMAL HIGH (ref 70–99)

## 2012-03-04 LAB — MAGNESIUM: Magnesium: 2.9 mg/dL — ABNORMAL HIGH (ref 1.5–2.5)

## 2012-03-04 LAB — CULTURE, BLOOD (ROUTINE X 2)
Culture: NO GROWTH
Culture: NO GROWTH

## 2012-03-04 LAB — BASIC METABOLIC PANEL WITH GFR
BUN: 64 mg/dL — ABNORMAL HIGH (ref 6–23)
CO2: 25 meq/L (ref 19–32)
Calcium: 7.1 mg/dL — ABNORMAL LOW (ref 8.4–10.5)
Chloride: 104 meq/L (ref 96–112)
Creatinine, Ser: 1.4 mg/dL — ABNORMAL HIGH (ref 0.50–1.35)
GFR calc Af Amer: 65 mL/min — ABNORMAL LOW (ref >90)
GFR calc non Af Amer: 56 mL/min — ABNORMAL LOW (ref >90)
Glucose, Bld: 234 mg/dL — ABNORMAL HIGH (ref 70–99)
Potassium: 5.9 meq/L — ABNORMAL HIGH (ref 3.5–5.1)
Sodium: 134 meq/L — ABNORMAL LOW (ref 135–145)

## 2012-03-04 LAB — PROTEIN, BODY FLUID

## 2012-03-04 LAB — BODY FLUID CELL COUNT WITH DIFFERENTIAL
Monocyte-Macrophage-Serous Fluid: 66 % (ref 50–90)
Total Nucleated Cell Count, Fluid: 2924 cu mm — ABNORMAL HIGH (ref 0–1000)

## 2012-03-04 LAB — POCT I-STAT 3, ART BLOOD GAS (G3+)
Bicarbonate: 21.3 mEq/L (ref 20.0–24.0)
Patient temperature: 98.6

## 2012-03-04 MED ORDER — INSULIN REGULAR HUMAN 100 UNIT/ML IJ SOLN
INTRAMUSCULAR | Status: AC
  Administered 2012-03-04: 18:00:00 via INTRAVENOUS
  Filled 2012-03-04: qty 2000

## 2012-03-04 MED ORDER — FUROSEMIDE 10 MG/ML IJ SOLN
40.0000 mg | Freq: Once | INTRAMUSCULAR | Status: AC
  Administered 2012-03-04: 40 mg via INTRAVENOUS
  Filled 2012-03-04: qty 4

## 2012-03-04 MED ORDER — DEXTROSE 5 % IV SOLN
INTRAVENOUS | Status: AC
  Administered 2012-03-04 (×2): via INTRAVENOUS

## 2012-03-04 MED ORDER — HYDROCORTISONE SOD SUCCINATE 100 MG IJ SOLR
50.0000 mg | Freq: Two times a day (BID) | INTRAMUSCULAR | Status: DC
  Administered 2012-03-04 – 2012-03-05 (×2): 50 mg via INTRAVENOUS
  Filled 2012-03-04 (×4): qty 1

## 2012-03-04 NOTE — Progress Notes (Signed)
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 Penicillins Other (See Comments)    Heart rate changes    Patient Measurements: Height: 5' 9"  (175.3 cm) Weight: 242 lb 15.2 oz (110.2 kg) IBW/kg (Calculated) : 70.7  Vital Signs: Temp: 98.6 F (37 C) (02/09 0400) Temp src: Oral (02/09 0400) BP: 101/66 mmHg (02/09 0700) Pulse Rate: 106 (02/09 0700) Intake/Output from previous day: 02/08 0701 - 02/09 0700 In: 2261.6 [I.V.:732.6; IV Piggyback:350; XQJ:1941] Out: 7408 [Urine:2205; Emesis/NG output:350; Drains:2225; Stool:50] Intake/Output from this shift:    Labs:  Recent Labs  03/01/12 0827  03/02/12 0500 03/03/12 0421 03/04/12 0504  WBC  --   < > 35.3* 27.6* 31.9*  HGB  --   < > 12.6* 13.5 13.7  HCT  --   < > 36.0* 39.8 40.5  PLT  --   < > 74* 94* 92*  APTT 36  --   --   --   --   INR 1.56*  --   --   --   --   < > = values in this interval not displayed.   Recent Labs  03/02/12 0042 03/02/12 0500 03/03/12 0421 03/04/12 0504  NA 135 134* 135 134*  K 4.6 4.7 5.2* 5.9*  CL 106 106 105 104  CO2 22 22 25 25   GLUCOSE 315* 333* 329* 234*  BUN 39* 40* 52* 64*  CREATININE 1.21 1.23 1.24 1.40*  CALCIUM 6.4* 6.6* 6.9* 7.1*  MG 2.6* 2.6*  --  2.9*  PHOS  --  2.6  --  3.2  PROT 3.8*  --   --   --   ALBUMIN 1.4*  --   --   --   AST 28  --   --   --   ALT 15  --   --   --   ALKPHOS 80  --   --   --   BILITOT 0.7  --   --   --    Estimated Creatinine Clearance: 74.7 ml/min (by C-G formula based on Cr of 1.4).    Recent Labs  03/03/12 2001 03/04/12 0021 03/04/12 0420  GLUCAP 185* 199* 220*   Insulin Requirements in the past 12 hours:  40 units insulin R in TPN + 15 units Novolog with SSI + 10 units lantus   Nutritional Goals:  Underfeeding goal: 1448-1856 kcal/day (60-70% of full goal of 2276 kcal/day), >/= 130g protein  per day (90% of full goal of >/= 145 grams per day), >/= 2L fluid/day per RD recommendations on 03/01/12   Current Nutrition:  Clinimix E5/15 at 83 ml/hr provides 100 gm protein/day (~71% goal) and an average of 1619kcal/day (101% underfeeding goal range)  Assessment: 52 y.o. M with history of ulcerative colitis presented to the hospital with back pain, fever and diarrhea. 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 TPN was initiated on 02/29/12 for nutrition support. Transferred to [facility] on 2/6 for continued management of care.   GI: Hx ulcerative colitis - previously in remission with mesalamine - developed toxic megacolon d/t cdiff colitis. Failed medical management, now POD#5 total colectomy. Started on TPN 2/5. No evidence of abdominal compartment syndrome per surgery but is at risk. +2.2 L drain output, 370m NG output Endo: No noted hx DM - CBGs 185-234  since last TPN started. Hyperglycemia likely related to multiple factors including TPN, critical illness and steroid administration (HC 50 mg/8h).  Lytes: Na 134, K 5.9, Phos 2.6 << 1.7 (s/p repletion with K-phos 20 mmol IV x 1 on 2/6), Mg 2.9, CoCa~9.18. Of note, TPN was stopped yesterday due to a high potassium. Electrolytes were in the next TPN by mistake and so Mg and K remain elevated today.  Renal: AKI - Scr now trending back up to 1.4, CrCl~75. UOP 0.7 ml/kg/hr Pulm: VDRF. 94% on Fio2 40% Cards: Hypotensive requiring pressors for support. Vasopressin + phenylephrine weaned off. BP 101/66, HR 107 (Afib - new onset this admit). Continues on amiodarone drip.  Hepatobil: LFTs WNL. TG 101 (2/6), pre-albumin~3 (2/6), alb 1.4.  Neuro: Sedated, GCS 3, RASS -4 (goal 0) - fent/versed gtt ID: Flagyl IV  + Vanc po D#7 (2/3 >> current) for CDiff colitis now s/p colectomy/ileostomy. Vanc IV + aztreonam dc'd. Tmax 100.3, WBC back up to 31.9.  Best Practices:  SCDs, PPI IV, MC TPN Access: PICC (placed 02/28/12), L-IJ CVC (place 03/01/12) TPN day#: 5 (2/5 >> current)  Plan:  1. Stop current TPN d/t hyperkalemia and hypermagnesemia  2. Start  D5 at 69m/hr until next TPN starts (not hanging D10 as protocol indicates as pt has been significantly hyperglycemic) 3. Start clinimix 5/15 at 83mhr tonight at 1800. Will try to titrate up to meet protein goals as glucose and volume will allow. 4. Lipids, MVI, TE on MWF only d/t national shortage 5. F/u CBGs and adjust insulin. Will continue with insulin 40 units in the TPN tonight since a full day of insulin in the TPN has been been assessed as multiple TPN bags had to be stopped prematurely.  6. F/u AM TPN labs  RaSalome ArntPharmD, BCPS Pager # [phone]/09/2012 7:16 AM

## 2012-03-04 NOTE — Progress Notes (Signed)
5 Days Post-Op  Subjective: Sedated on vent. Critically ill  Objective: Vital signs in last 24 hours: Temp:  [98.6 F (37 C)-100.3 F (37.9 C)] 99.8 F (37.7 C) (02/09 0738) Pulse Rate:  [103-117] 107 (02/09 0718) Resp:  [21-29] 29 (02/09 0718) BP: (87-103)/(55-69) 101/66 mmHg (02/09 0718) SpO2:  [93 %-97 %] 94 % (02/09 0718) FiO2 (%):  [40 %-50 %] 40 % (02/09 0721) Last BM Date: 03/03/12  Intake/Output from previous day: 02/08 0701 - 02/09 0700 In: 2261.6 [I.V.:732.6; IV Piggyback:350; TPN:1179] Out: 4830 [Urine:2205; Emesis/NG output:350; Drains:2225; Stool:50] Intake/Output this shift:    GI: Moderate distension. wound looks ok. ostomy ischemic but stable  Lab Results:   Recent Labs  03/03/12 0421 03/04/12 0504  WBC 27.6* 31.9*  HGB 13.5 13.7  HCT 39.8 40.5  PLT 94* 92*   BMET  Recent Labs  03/03/12 0421 03/04/12 0504  NA 135 134*  K 5.2* 5.9*  CL 105 104  CO2 25 25  GLUCOSE 329* 234*  BUN 52* 64*  CREATININE 1.24 1.40*  CALCIUM 6.9* 7.1*   PT/INR  Recent Labs  03/01/12 0827  LABPROT 18.2*  INR 1.56*   ABG  Recent Labs  03/02/12 0042 03/04/12 0646  PHART 7.302* 7.508*  HCO3 22.7 21.3    Studies/Results: Dg Chest Port 1 View  03/04/2012  *RADIOLOGY REPORT*  Clinical Data: Follow-up pleural effusions (  PORTABLE CHEST - 1 VIEW  Comparison: Chest radiograph 03/03/2012  Findings: Endotracheal tube and right PICC line are unchanged.  NG tube unchanged.  Stable cardiac silhouette.  The left heart border is obscured by dense air space disease.  There are bilateral pleural effusions greater on the left.  IMPRESSION:  1.  Stable support apparatus. 2.  No significant change. 3.  Dense left lower lobe atelectasis and consolidation with effusion.   Original Report Authenticated By: Suzy Bouchard, M.D.    Dg Chest Port 1 View  03/03/2012  *RADIOLOGY REPORT*  Clinical Data: 52 year old male with pleural effusion.  PORTABLE CHEST - 1 VIEW  Comparison:  03/01/2012 and earlier.  Findings: Semi upright AP portable view at 0635 hours.  Stable endotracheal tube.  Stable visible enteric tube.  Stable right PICC line.  Larger lung volumes, but continued left greater than right pleural effusions with extensive bilateral veiling opacity.  No pneumothorax.  No definite edema.  Right lower lobe and widespread left lung collapse or consolidation.  IMPRESSION: 1. Stable lines and tubes. 2. Improved lung volumes.  Moderate to large left pleural effusion and small to moderate right pleural effusion persist with significant associated lung collapse / consolidation.   Original Report Authenticated By: Roselyn Reef, M.D.     Anti-infectives: Anti-infectives   Start     Dose/Rate Route Frequency Ordered Stop   03/02/12 1200  vancomycin (VANCOCIN) 50 mg/mL oral solution 500 mg     500 mg Oral 4 times per day 03/02/12 0913     03/01/12 1445  vancomycin (VANCOCIN) 50 mg/mL oral solution 500 mg  Status:  Discontinued     500 mg Oral 3 times per day 03/01/12 1340 03/02/12 0913   03/01/12 1400  aztreonam (AZACTAM) 1 g in dextrose 5 % 50 mL IVPB  Status:  Discontinued     1 g 100 mL/hr over 30 Minutes Intravenous 3 times per day 03/01/12 1356 03/02/12 1146   03/01/12 1300  vancomycin (VANCOCIN) 1,250 mg in sodium chloride 0.9 % 250 mL IVPB  Status:  Discontinued  1,250 mg 166.7 mL/hr over 90 Minutes Intravenous Every 12 hours 03/01/12 1127 03/02/12 1146   02/29/12 0600  ciprofloxacin (CIPRO) IVPB 400 mg     400 mg 200 mL/hr over 60 Minutes Intravenous On call to O.R. 02/28/12 1409 02/28/12 1438   02/28/12 2200  vancomycin (VANCOCIN) 500 mg in sodium chloride irrigation 0.9 % 60 mL ENEMA  Status:  Discontinued     500 mg Rectal 2 times daily 02/28/12 1504 02/28/12 1817   02/28/12 1200  vancomycin (VANCOCIN) 50 mg/mL oral solution 500 mg  Status:  Discontinued    Comments:  First dose ASAP   500 mg Per Tube 4 times per day 02/28/12 0814 02/28/12 1817   02/28/12 1000   vancomycin (VANCOCIN) IVPB 1000 mg/200 mL premix  Status:  Discontinued     1,000 mg 200 mL/hr over 60 Minutes Intravenous Every 12 hours 02/28/12 0845 03/01/12 1127   02/28/12 1000  vancomycin (VANCOCIN) 500 mg in sodium chloride irrigation 0.9 % 100 mL ENEMA  Status:  Discontinued     500 mg Rectal 2 times daily 02/28/12 0923 02/28/12 1504   02/27/12 1600  metroNIDAZOLE (FLAGYL) IVPB 500 mg  Status:  Discontinued     500 mg 100 mL/hr over 60 Minutes Intravenous Every 8 hours 02/27/12 1546 02/27/12 2000   02/27/12 1600  ciprofloxacin (CIPRO) IVPB 400 mg  Status:  Discontinued     400 mg 200 mL/hr over 60 Minutes Intravenous Every 12 hours 02/27/12 1547 02/27/12 2000   02/27/12 1545  metroNIDAZOLE (FLAGYL) IVPB 500 mg  Status:  Discontinued     500 mg 100 mL/hr over 60 Minutes Intravenous  Once 02/27/12 1535 02/28/12 0843   02/27/12 1545  vancomycin (VANCOCIN) 500 mg in sodium chloride 0.9 % 100 mL IVPB  Status:  Discontinued     500 mg 100 mL/hr over 60 Minutes Intravenous  Once 02/27/12 1535 02/27/12 1538   02/27/12 1545  ciprofloxacin (CIPRO) IVPB 400 mg  Status:  Discontinued     400 mg 200 mL/hr over 60 Minutes Intravenous  Once 02/27/12 1538 02/28/12 0843   02/27/12 1415  vancomycin (VANCOCIN) 500 mg in sodium chloride 0.9 % 100 mL IVPB     500 mg 100 mL/hr over 60 Minutes Intravenous  Once 02/27/12 1402 02/27/12 1411   02/27/12 1400  vancomycin (VANCOCIN) powder 500 mg  Status:  Discontinued     500 mg Other To Surgery 02/27/12 1359 02/27/12 1402   02/27/12 1200  metroNIDAZOLE (FLAGYL) IVPB 500 mg    Comments:  First dose ASAP   500 mg 100 mL/hr over 60 Minutes Intravenous Every 6 hours 02/27/12 0953     02/27/12 1200  vancomycin (VANCOCIN) 50 mg/mL oral solution 250 mg  Status:  Discontinued    Comments:  First dose ASAP   250 mg Oral 4 times per day 02/27/12 0953 02/28/12 0814   02/26/12 1700  oseltamivir (TAMIFLU) capsule 75 mg     75 mg Oral  Once 02/26/12 1651 02/26/12  1715      Assessment/Plan: s/p Procedure(s): TOTAL COLECTOMY (N/A) ILEOSTOMY (N/A) Continue abx Monitor ostomy Critical care per CCM Will need K removed from fluids  LOS: 7 days    TOTH III,Hussain Maimone S 03/04/2012

## 2012-03-04 NOTE — Procedures (Signed)
Thoracentesis Procedure Note  Pre-operative Diagnosis: Left pleural effusion  Post-operative Diagnosis: same  Indications: Left pleural effusion  Procedure Details  Consent: Informed consent was obtained. Risks of the procedure were discussed including: infection, bleeding, pain, pneumothorax.  Under sterile conditions the patient was positioned. Betadine solution and sterile drapes were utilized.  1% plain lidocaine was used to anesthetize the left 6 rib space. Fluid was obtained without any difficulties and minimal blood loss.  A dressing was applied to the wound and wound care instructions were provided.  Performed with ultrasound guidance.  Findings 1200 ml of orange/red cloudy pleural fluid with fibrinous material was obtained.  Complications:  None; patient tolerated the procedure well.          Condition: stable  Plan A follow up chest x-ray was ordered. Fluid samples sent for glucose, protein, LDH, amylase, cholesterol, cell count with differential, gram stain and culture, and cytology.  Chesley Mires, MD [facility] Pulmonary/Critical Care 03/04/2012, 2:46 PM Pager:  [phone] After 3pm call: [phone]

## 2012-03-04 NOTE — Progress Notes (Signed)
PULMONARY  / CRITICAL CARE MEDICINE  Name: Connor Small MRN: [id number] DOB: 1960-08-24    ADMISSION DATE:  02/26/2012 CONSULTATION DATE:  2/5  REFERRING MD :  Karilyn Cota  CHIEF COMPLAINT:  Acute resp failure   BRIEF PATIENT DESCRIPTION:  [name] admitted to [facility] for Cdiff on 2/2. Underwent decompressive cecostomy 2/3, initially was better, then worsened on 2/4 with increased abdominal distension and tenderness, decreased renal function.  He was taken back to the OR on 2/4 for total abdominal colectomy and end ileostomy.  Post op he developed ARDS and sepsis shock and was being treated for this.  The morning of 2/6 he had new onset of A-fib and transferred to [facility].    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  VITAL SIGNS: Temp:  [98.6 F (37 C)-100.3 F (37.9 C)] 99.8 F (37.7 C) (02/09 0738) Pulse Rate:  [103-117] 105 (02/09 0900) Resp:  [21-29] 26 (02/09 0900) BP: (85-112)/(44-68) 112/66 mmHg (02/09 0900) SpO2:  [93 %-97 %] 93 % (02/09 0900) FiO2 (%):  [40 %-50 %] 40 % (02/09 0721) HEMODYNAMICS: CVP:  [10 mmHg] 10 mmHg VENTILATOR SETTINGS: Vent Mode:  [-] CPAP;PSV FiO2 (%):  [40 %-50 %] 40 % Set Rate:  [24 bmp] 24 bmp Vt Set:  [420 mL] 420 mL PEEP:  [5 cmH20-10 cmH20] 5 cmH20 Pressure Support:  [10 cmH20] 10 cmH20 Plateau Pressure:  [15 cmH20-22 cmH20] 15 cmH20 INTAKE / OUTPUT: Intake/Output     02/08 0701 - 02/09 0700 02/09 0701 -  02/10 0700   I.V. (mL/kg) 732.6 (6.6) 22.2 (0.2)   Other     IV Piggyback 350    TPN 1179    Total Intake(mL/kg) 2261.6 (20.5) 22.2 (0.2)   Urine (mL/kg/hr) 2205 (0.8) 125 (0.3)   Emesis/NG output 350 (0.1)    Drains 2225 (0.8) 325 (0.9)   Stool 50 (0)    Total Output 4830 450   Net -2568.4 -427.8          PHYSICAL EXAMINATION: General:  Ill appearing ventilated patient Neuro: Sedated HEENT: ETT in place Cardiovascular: irregular Lungs: b/l rhonchi/rales Abdomen: Distended, wound site clean Musculoskeletal: 2+ edema Skin: no rashes  LABS:  Recent Labs Lab 02/26/12 1409  02/28/12 1244  02/28/12 1924  02/29/12 0439 02/29/12 0452  03/01/12 0455 03/01/12 0508  03/01/12 0514 03/01/12 0827  03/01/12 1725 03/02/12 0042 03/02/12 0500 03/03/12 0421 03/04/12 0504 03/04/12 0646  HGB 17.2*  < >  --   --  13.7  --  14.5  --   --   --   --   --  11.3*  --   --   --  11.9* 12.6* 13.5 13.7  --   WBC 13.8*  < >  --   --  3.9*  --  14.2*  --   --   --   --   --  33.0*  --   --   --  35.6* 35.3* 27.6*  31.9*  --   PLT 134*  < >  --   --  124*  --  113*  --   --   --   --   --  93*  --   --   --  76* 74* 94* 92*  --   NA 135  < >  --   --  138  < > 136  --   --   --   --   --  137  --   --   --  135 134* 135 134*  --   K 3.7  < >  --   < > 5.0  < > 3.6  --   --   --   --   --  3.4*  --   --   --  4.6 4.7 5.2* 5.9*  --   CL 101  < >  --   --  117*  < > 111  --   --   --   --   --  106  --   --   --  106 106 105 104  --   CO2 20  < >  --   --  15*  < > 15*  --   --   --   --   --  24  --   --   --  22 22 25 25   --   GLUCOSE 114*  < >  --   --  117*  < > 165*  --   --   --   --   --  215*  --   --   --  315* 333* 329* 234*  --   BUN 27*  < >  --   --  37*  < > 39*  --   --   --   --   --  40*  --   --   --  39* 40* 52* 64*  --   CREATININE 1.15  < >  --   --  1.90*  < > 1.78*  --   --   --   --   --  1.38*  --   --   --  1.21 1.23 1.24 1.40*  --   CALCIUM 8.4  < >  --   --  5.7*  < >  6.3*  --   --   --   --   --  6.6*  --   --   --  6.4* 6.6* 6.9* 7.1*  --   MG  --   --   --   --   --   --   --   --   --   --   --   < > 2.4  --   --   --  2.6* 2.6*  --  2.9*  --   PHOS  --   --   --   --   --   < >  --   --   --   --   --   --  1.7*  --   --   --   --  2.6  --  3.2  --   AST 14  < >  --   --  24  < > 41*  --   --   --   --   --  38*  --   --   --  28  --   --   --   --   ALT 14  < >  --   --  10  < > 16  --   --   --   --   --  17  --   --   --  15  --   --   --   --   ALKPHOS 59  < >  --   --  19*  < > 37*  --   --   --   --   --  54  --   --   --  80  --   --   --   --   BILITOT 1.3*  < >  --   --  0.2*  < > 0.3  --   --   --   --   --  0.6  --   --   --  0.7  --   --   --   --   PROT 6.4  < >  --   --  2.3*  < > 3.0*  --   --   --   --   --  3.8*  --   --   --  3.8*  --   --   --   --   ALBUMIN 3.1*  < >  --   --  0.7*  < > 1.0*  --   --   --   --   --  1.8*  --   --   --  1.4*  --   --   --   --   APTT  --   --   --   --   --   --   --   --   --   --   --   --   --  36  --   --   --   --   --   --   --   INR  --   --   --   --   --   --   --   --   --   --   --   --   --  1.56*  --   --   --   --   --   --   --   LATICACIDVEN 2.0  < > 6.7*  --  3.6*  --   --  4.2*  --   --   --   --   --   --   --   --   --   --   --   --   --   TROPONINI <0.30  --   --   --   --   --   --   --   --   --   --   --   --   --   --   --  <0.30  --   --   --   --   PROCALCITON  --   --   --   --  21.86  --  31.67  --   --   --  20.70  --   --   --   --   --   --   --   --   --   --   PHART  --   < >  --   --   --   < >  --   --   < >  7.366  --   --   --   --   < > 7.349* 7.302*  --   --   --  7.508*  PCO2ART  --   < >  --   --   --   < >  --   --   < > 41.6  --   --   --   --   < > 39.0 47.5*  --   --   --  26.7*  PO2ART  --   < >  --   --   --   < >  --   --   < > 112.0*  --   --   --   --   < > 94.0 89.2  --   --   --  143.0*  < > = values in this interval not displayed.  Recent Labs Lab  03/03/12 1539 03/03/12 2001 03/04/12 0021 03/04/12 0420 03/04/12 0734  GLUCAP 180* 185* 199* 220* 214*    Imaging: Dg Chest Port 1 View  03/04/2012  *RADIOLOGY REPORT*  Clinical Data: Follow-up pleural effusions (  PORTABLE CHEST - 1 VIEW  Comparison: Chest radiograph 03/03/2012  Findings: Endotracheal tube and right PICC line are unchanged.  NG tube unchanged.  Stable cardiac silhouette.  The left heart border is obscured by dense air space disease.  There are bilateral pleural effusions greater on the left.  IMPRESSION:  1.  Stable support apparatus. 2.  No significant change. 3.  Dense left lower lobe atelectasis and consolidation with effusion.   Original Report Authenticated By: Genevive Bi, M.D.    Dg Chest Port 1 View  03/03/2012  *RADIOLOGY REPORT*  Clinical Data: 52 year old male with pleural effusion.  PORTABLE CHEST - 1 VIEW  Comparison: 03/01/2012 and earlier.  Findings: Semi upright AP portable view at 0635 hours.  Stable endotracheal tube.  Stable visible enteric tube.  Stable right PICC line.  Larger lung volumes, but continued left greater than right pleural effusions with extensive bilateral veiling opacity.  No pneumothorax.  No definite edema.  Right lower lobe and widespread left lung collapse or consolidation.  IMPRESSION: 1. Stable lines and tubes. 2. Improved lung volumes.  Moderate to large left pleural effusion and small to moderate right pleural effusion persist with significant associated lung collapse / consolidation.   Original Report Authenticated By: Erskine Speed, M.D.      ASSESSMENT / PLAN:  PULMONARY A: Acute respiratory failure in setting of septic shock and volume overload. Lt pleural effusion. P:   Pressure support as tolerated >> not ready for extubation F/u CXR Assess Lt pleural space for thoracentesis BD's prn  CARDIOVASCULAR A: Septic Shock - Secondary to C-diff colitis; off pressors 2/08. Atrial Fibrillation with RVR. Low [place] score. P: .   CVP goal 8 to 12 Continue amiodarone Add ASA when able to take enteral meds  RENAL A:  Acute Renal Failure >> Likely from contrast dye, and ATN in setting of sepsis. Hypervolemia. 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 Hx of ulcerative colitis. Followed by Dr. Charna Elizabeth as outpt. Nutrition. P:   Post-op care per [facility] Continue TNA SUP with pepcid  HEMATOLOGIC A: Leukocytosis. Thrombocytopenia. P:  F/u CBC  INFECTIOUS A: C diff colitis. P:   Continue enteral vancomycin and IV flagyl  ENDOCRINE A: Hyperglycemia.  Relative adrenal insufficiency. P:   SSI Lantus 10 units 2/8 Change solucortef to 50 mg q12h on 2/9  NEUROLOGIC A:  Acute encephalopathy 2nd to sepsis. P:   Sedation protocol while on vent  Updated family at bedside.   CC time 35 minutes.  Coralyn Helling, MD [facility] Pulmonary/Critical Care 03/04/2012, 10:21 AM Pager:  [phone] After 3pm call: [phone]

## 2012-03-05 ENCOUNTER — Inpatient Hospital Stay (HOSPITAL_COMMUNITY): Payer: BC Managed Care – PPO

## 2012-03-05 DIAGNOSIS — E46 Unspecified protein-calorie malnutrition: Secondary | ICD-10-CM

## 2012-03-05 LAB — COMPREHENSIVE METABOLIC PANEL
ALT: 13 U/L (ref 0–53)
Alkaline Phosphatase: 203 U/L — ABNORMAL HIGH (ref 39–117)
BUN: 72 mg/dL — ABNORMAL HIGH (ref 6–23)
CO2: 23 mEq/L (ref 19–32)
Chloride: 107 mEq/L (ref 96–112)
GFR calc Af Amer: 62 mL/min — ABNORMAL LOW (ref >90)
GFR calc non Af Amer: 54 mL/min — ABNORMAL LOW (ref >90)
Glucose, Bld: 250 mg/dL — ABNORMAL HIGH (ref 70–99)
Potassium: 6.2 mEq/L — ABNORMAL HIGH (ref 3.5–5.1)
Sodium: 135 mEq/L (ref 135–145)
Total Bilirubin: 0.5 mg/dL (ref 0.3–1.2)
Total Protein: 3.9 g/dL — ABNORMAL LOW (ref 6.0–8.3)

## 2012-03-05 LAB — BASIC METABOLIC PANEL
CO2: 25 mEq/L (ref 19–32)
Calcium: 6.6 mg/dL — ABNORMAL LOW (ref 8.4–10.5)
Glucose, Bld: 233 mg/dL — ABNORMAL HIGH (ref 70–99)
Potassium: 6.1 mEq/L — ABNORMAL HIGH (ref 3.5–5.1)
Sodium: 137 mEq/L (ref 135–145)

## 2012-03-05 LAB — CHOLESTEROL, BODY FLUID: Cholesterol, Fluid: 10 mg/dL

## 2012-03-05 LAB — PREALBUMIN: Prealbumin: 12.9 mg/dL — ABNORMAL LOW (ref 17.0–34.0)

## 2012-03-05 LAB — POCT I-STAT 3, ART BLOOD GAS (G3+)
O2 Saturation: 96 %
TCO2: 22 mmol/L (ref 0–100)
pCO2 arterial: 38.8 mmHg (ref 35.0–45.0)
pH, Arterial: 7.332 — ABNORMAL LOW (ref 7.350–7.450)
pO2, Arterial: 85 mmHg (ref 80.0–100.0)

## 2012-03-05 LAB — DIFFERENTIAL
Basophils Relative: 1 % (ref 0–1)
Eosinophils Absolute: 0 10*3/uL (ref 0.0–0.7)
Lymphocytes Relative: 4 % — ABNORMAL LOW (ref 12–46)
Lymphs Abs: 1.2 10*3/uL (ref 0.7–4.0)
Neutro Abs: 26.3 10*3/uL — ABNORMAL HIGH (ref 1.7–7.7)

## 2012-03-05 LAB — GLUCOSE, CAPILLARY
Glucose-Capillary: 206 mg/dL — ABNORMAL HIGH (ref 70–99)
Glucose-Capillary: 209 mg/dL — ABNORMAL HIGH (ref 70–99)
Glucose-Capillary: 226 mg/dL — ABNORMAL HIGH (ref 70–99)
Glucose-Capillary: 227 mg/dL — ABNORMAL HIGH (ref 70–99)

## 2012-03-05 LAB — CBC
HCT: 44.3 % (ref 39.0–52.0)
Hemoglobin: 15.1 g/dL (ref 13.0–17.0)
RBC: 4.67 MIL/uL (ref 4.22–5.81)
WBC: 29.9 10*3/uL — ABNORMAL HIGH (ref 4.0–10.5)

## 2012-03-05 LAB — CHOLESTEROL, TOTAL: Cholesterol: 74 mg/dL (ref 0–200)

## 2012-03-05 LAB — TRIGLYCERIDES: Triglycerides: 154 mg/dL — ABNORMAL HIGH (ref <150)

## 2012-03-05 MED ORDER — SODIUM CHLORIDE 0.45 % IV SOLN
INTRAVENOUS | Status: DC
  Administered 2012-03-05 – 2012-03-08 (×3): via INTRAVENOUS

## 2012-03-05 MED ORDER — FAT EMULSION 20 % IV EMUL
250.0000 mL | INTRAVENOUS | Status: AC
  Administered 2012-03-05: 250 mL via INTRAVENOUS
  Filled 2012-03-05: qty 250

## 2012-03-05 MED ORDER — METRONIDAZOLE IN NACL 5-0.79 MG/ML-% IV SOLN
500.0000 mg | Freq: Three times a day (TID) | INTRAVENOUS | Status: DC
  Administered 2012-03-05 – 2012-03-19 (×42): 500 mg via INTRAVENOUS
  Filled 2012-03-05 (×45): qty 100

## 2012-03-05 MED ORDER — SODIUM CHLORIDE 0.9 % IV BOLUS (SEPSIS)
1000.0000 mL | Freq: Once | INTRAVENOUS | Status: AC
  Administered 2012-03-05: 1000 mL via INTRAVENOUS

## 2012-03-05 MED ORDER — IOHEXOL 300 MG/ML  SOLN
25.0000 mL | INTRAMUSCULAR | Status: AC
  Administered 2012-03-05 (×2): 25 mL via ORAL

## 2012-03-05 MED ORDER — TRACE MINERALS CR-CU-F-FE-I-MN-MO-SE-ZN IV SOLN
INTRAVENOUS | Status: AC
  Administered 2012-03-05: 18:00:00 via INTRAVENOUS
  Filled 2012-03-05: qty 2000

## 2012-03-05 MED ORDER — SODIUM BICARBONATE 8.4 % IV SOLN
INTRAVENOUS | Status: DC
  Administered 2012-03-05 – 2012-03-07 (×3): via INTRAVENOUS
  Filled 2012-03-05 (×4): qty 150

## 2012-03-05 MED ORDER — HYDROCORTISONE SOD SUCCINATE 100 MG IJ SOLR
25.0000 mg | Freq: Two times a day (BID) | INTRAMUSCULAR | Status: DC
  Administered 2012-03-05 – 2012-03-06 (×2): 25 mg via INTRAVENOUS
  Filled 2012-03-05 (×4): qty 0.5

## 2012-03-05 NOTE — Progress Notes (Signed)
Hypotension   1L bolus given

## 2012-03-05 NOTE — Progress Notes (Signed)
Patient ID: Connor Small, male   DOB: [birth year], 52 y.o.   MRN: [id number] 6 Days Post-Op  Subjective: Off sedation but no awake yet, doesn't really respond to palp on abd or ileostomy exam.  They are trying to start weaning him  Objective: Vital signs in last 24 hours: Temp:  [98.5 F (36.9 C)-101 F (38.3 C)] 99.4 F (37.4 C) (02/10 0851) Pulse Rate:  [100-109] 108 (02/10 0848) Resp:  [20-30] 30 (02/10 0848) BP: (81-105)/(52-71) 84/56 mmHg (02/10 0848) SpO2:  [92 %-99 %] 94 % (02/10 0848) FiO2 (%):  [40 %] 40 % (02/10 0849) Last BM Date: 03/04/12  Intake/Output from previous day: 02/09 0701 - 02/10 0700 In: 3041.9 [I.V.:762.9; IV Piggyback:1200; TPN:1079] Out: 6525 [Urine:2655; Emesis/NG output:75; Drains:2595] Intake/Output this shift: Total I/O In: -  Out: 670 [Urine:220; Drains:450]  GI: Moderate distension. Wound with some fat tissue necrosis but no dehiscence and otherwise looks good.. Ostomy ischemic, did not see it yesterday but appears to be something we can watch. No output except some brown bloody fluid  Lab Results:   Recent Labs  03/04/12 0504 03/05/12 0430  WBC 31.9* 29.9*  HGB 13.7 15.1  HCT 40.5 44.3  PLT 92* 104*   BMET  Recent Labs  03/04/12 0504 03/05/12 0430  NA 134* 135  K 5.9* 6.2*  CL 104 107  CO2 25 23  GLUCOSE 234* 250*  BUN 64* 72*  CREATININE 1.40* 1.45*  CALCIUM 7.1* 6.8*   PT/INR No results found for this basename: LABPROT, INR,  in the last 72 hours ABG  Recent Labs  03/04/12 0646  PHART 7.508*  HCO3 21.3    Studies/Results: Dg Chest Port 1 View  03/05/2012  *RADIOLOGY REPORT*  Clinical Data: Evaluate endotracheal tube positioning, pleural effusions  PORTABLE CHEST - 1 VIEW  Comparison: 03/04/2012; 03/03/2012; 03/01/2012 02/27/2012;  Findings: Grossly unchanged cardiac silhouette and mediastinal contours.  Stable positioning of support apparatus.  Grossly unchanged small bilateral pleural effusions with associated  bibasilar opacities, left greater than right.  Pulmonary vasculature remains indistinct.  No pneumothorax.  Unchanged bones.  IMPRESSION:  1.  Stable positioning of support apparatus.  No pneumothorax. 2.  Grossly unchanged findings of pulmonary edema, small bilateral effusions and bibasilar opacities, left greater than right, atelectasis versus infiltrate.   Original Report Authenticated By: Tacey Ruiz, MD    Dg Chest Port 1 View  03/04/2012  *RADIOLOGY REPORT*  Clinical Data: Left thoracentesis, follow-up  PORTABLE CHEST - 1 VIEW  Comparison: Portable chest x-ray from earlier today  Findings: There has been a decrease in volume of the left pleural effusion after left thoracentesis.  Haziness remains at both lung bases consistent with effusions and atelectasis.  Mild cardiomegaly is stable.  The endotracheal tube and left central venous line as well as the right PICC line are unchanged in position.  IMPRESSION: Decrease in volume of left pleural effusion after left thoracentesis.  No pneumothorax.   Original Report Authenticated By: Dwyane Dee, M.D.    Dg Chest Port 1 View  03/04/2012  *RADIOLOGY REPORT*  Clinical Data: Follow-up pleural effusions (  PORTABLE CHEST - 1 VIEW  Comparison: Chest radiograph 03/03/2012  Findings: Endotracheal tube and right PICC line are unchanged.  NG tube unchanged.  Stable cardiac silhouette.  The left heart border is obscured by dense air space disease.  There are bilateral pleural effusions greater on the left.  IMPRESSION:  1.  Stable support apparatus. 2.  No significant change. 3.  Dense left lower lobe atelectasis and consolidation with effusion.   Original Report Authenticated By: Genevive Bi, M.D.     Anti-infectives: Anti-infectives   Start     Dose/Rate Route Frequency Ordered Stop   03/02/12 1200  vancomycin (VANCOCIN) 50 mg/mL oral solution 500 mg     500 mg Oral 4 times per day 03/02/12 0913     03/01/12 1445  vancomycin (VANCOCIN) 50 mg/mL oral  solution 500 mg  Status:  Discontinued     500 mg Oral 3 times per day 03/01/12 1340 03/02/12 0913   03/01/12 1400  aztreonam (AZACTAM) 1 g in dextrose 5 % 50 mL IVPB  Status:  Discontinued     1 g 100 mL/hr over 30 Minutes Intravenous 3 times per day 03/01/12 1356 03/02/12 1146   03/01/12 1300  vancomycin (VANCOCIN) 1,250 mg in sodium chloride 0.9 % 250 mL IVPB  Status:  Discontinued     1,250 mg 166.7 mL/hr over 90 Minutes Intravenous Every 12 hours 03/01/12 1127 03/02/12 1146   02/29/12 0600  ciprofloxacin (CIPRO) IVPB 400 mg     400 mg 200 mL/hr over 60 Minutes Intravenous On call to O.R. 02/28/12 1409 02/28/12 1438   02/28/12 2200  vancomycin (VANCOCIN) 500 mg in sodium chloride irrigation 0.9 % 60 mL ENEMA  Status:  Discontinued     500 mg Rectal 2 times daily 02/28/12 1504 02/28/12 1817   02/28/12 1200  vancomycin (VANCOCIN) 50 mg/mL oral solution 500 mg  Status:  Discontinued    Comments:  First dose ASAP   500 mg Per Tube 4 times per day 02/28/12 0814 02/28/12 1817   02/28/12 1000  vancomycin (VANCOCIN) IVPB 1000 mg/200 mL premix  Status:  Discontinued     1,000 mg 200 mL/hr over 60 Minutes Intravenous Every 12 hours 02/28/12 0845 03/01/12 1127   02/28/12 1000  vancomycin (VANCOCIN) 500 mg in sodium chloride irrigation 0.9 % 100 mL ENEMA  Status:  Discontinued     500 mg Rectal 2 times daily 02/28/12 0923 02/28/12 1504   02/27/12 1600  metroNIDAZOLE (FLAGYL) IVPB 500 mg  Status:  Discontinued     500 mg 100 mL/hr over 60 Minutes Intravenous Every 8 hours 02/27/12 1546 02/27/12 2000   02/27/12 1600  ciprofloxacin (CIPRO) IVPB 400 mg  Status:  Discontinued     400 mg 200 mL/hr over 60 Minutes Intravenous Every 12 hours 02/27/12 1547 02/27/12 2000   02/27/12 1545  metroNIDAZOLE (FLAGYL) IVPB 500 mg  Status:  Discontinued     500 mg 100 mL/hr over 60 Minutes Intravenous  Once 02/27/12 1535 02/28/12 0843   02/27/12 1545  vancomycin (VANCOCIN) 500 mg in sodium chloride 0.9 % 100 mL  IVPB  Status:  Discontinued     500 mg 100 mL/hr over 60 Minutes Intravenous  Once 02/27/12 1535 02/27/12 1538   02/27/12 1545  ciprofloxacin (CIPRO) IVPB 400 mg  Status:  Discontinued     400 mg 200 mL/hr over 60 Minutes Intravenous  Once 02/27/12 1538 02/28/12 0843   02/27/12 1415  vancomycin (VANCOCIN) 500 mg in sodium chloride 0.9 % 100 mL IVPB     500 mg 100 mL/hr over 60 Minutes Intravenous  Once 02/27/12 1402 02/27/12 1411   02/27/12 1400  vancomycin (VANCOCIN) powder 500 mg  Status:  Discontinued     500 mg Other To Surgery 02/27/12 1359 02/27/12 1402   02/27/12 1200  metroNIDAZOLE (FLAGYL) IVPB 500 mg    Comments:  First dose ASAP   500 mg 100 mL/hr over 60 Minutes Intravenous Every 6 hours 02/27/12 0953     02/27/12 1200  vancomycin (VANCOCIN) 50 mg/mL oral solution 250 mg  Status:  Discontinued    Comments:  First dose ASAP   250 mg Oral 4 times per day 02/27/12 0953 02/28/12 0814   02/26/12 1700  oseltamivir (TAMIFLU) capsule 75 mg     75 mg Oral  Once 02/26/12 1651 02/26/12 1715      Assessment/Plan: 1. POD#6-Total abdominal colectomy with end ileostomy, intra-abdominal drain placement x 2 by Dr. Suzette Battiest (02/28/12): ostomy still viable but needs close monitoring, WBC still 29 looks like on vanc and flagyl, on TNA, no acute changes, still with hyperkalemia, CCM and pharmacy addressing, K already removed from IV fluids.  --keep NPO and NGT  --watch ostomy closely  --Continue abx  --ccm working on weaning, appreciate their care for this patient.  --keep on TNA  - open wound 2.  C. Diff colitis 3.  VDRF 4.  Encephalopathy - for CT to check head  Time spent on patient care/discussion making: 30 mins Family discussion:    LOS: 8 days    WHITE, ELIZABETH 03/05/2012  Discussed with Dr. Tyson Alias.  He is not waking up, so there are plans for CT of head tonight.  Since he is going down for a head CT scan, we will scan his abdomen and pelvis with oral (hold IV  because of kidneys) contrast at the same time.  Wound and ostomy look beat up.  JP's in the left abdomen are putting out >1,000cc/day.  Ovidio Kin, MD, [facility] Surgery Pager: [phone] Office phone:  [phone]

## 2012-03-05 NOTE — Progress Notes (Signed)
[place] Progress Note Patient Name: Connor Small DOB: [birth year] MRN: [id number]  Date of Service  03/05/2012   HPI/Events of Note  Potassium of 6.2 on no potassium supplementation other then what is in TPN and hypotension on no pressors with CVP of 3.   eICU Interventions  Plan: Repeat BMET in afternoon to followup K  1 liter NS fluid bolus for hypotension in the setting of low CVP   Intervention Category Intermediate Interventions: Electrolyte abnormality - evaluation and management;Hypotension - evaluation and management  Damonta Cossey 03/05/2012, 6:20 AM

## 2012-03-05 NOTE — Progress Notes (Signed)
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 Penicillins Other (See Comments)    Heart rate changes    Patient Measurements: Height: 5' 9"  (175.3 cm) Weight: 242 lb 15.2 oz (110.2 kg) IBW/kg (Calculated) : 70.7  Vital Signs: Temp: 98.6 F (37 C) (02/10 0405) Temp src: Oral (02/10 0405) BP: 81/58 mmHg (02/10 0610) Pulse Rate: 109 (02/10 0616) Intake/Output from previous day: 02/09 0701 - 02/10 0700 In: 3041.9 [I.V.:762.9; IV Piggyback:1200; PZW:2585] Out: 6525 [Urine:2655; Emesis/NG output:75; Drains:2595] Intake/Output from this shift: Total I/O In: -  Out: 420 [Urine:220; Drains:200]  Labs:  Recent Labs  03/03/12 0421 03/04/12 0504 03/05/12 0430  WBC 27.6* 31.9* 29.9*  HGB 13.5 13.7 15.1  HCT 39.8 40.5 44.3  PLT 94* 92* 104*     Recent Labs  03/03/12 0421 03/04/12 0504 03/05/12 0430  NA 135 134* 135  K 5.2* 5.9* 6.2*  CL 105 104 107  CO2 25 25 23   GLUCOSE 329* 234* 250*  BUN 52* 64* 72*  CREATININE 1.24 1.40* 1.45*  CALCIUM 6.9* 7.1* 6.8*  MG  --  2.9* 2.8*  PHOS  --  3.2 3.3  PROT  --   --  3.9*  ALBUMIN  --   --  1.1*  AST  --   --  25  ALT  --   --  13  ALKPHOS  --   --  203*  BILITOT  --   --  0.5  TRIG  --   --  154*  CHOL  --   --  74   Estimated Creatinine Clearance: 72.1 ml/min (by C-G formula based on Cr of 1.45).    Recent Labs  03/04/12 1959 03/05/12 0027 03/05/12 0426  GLUCAP 228* 227* 208*   Insulin Requirements in the past 12 hours:  40 units insulin R in TPN + 36 units Novolog with SSI + 10 units lantus   Nutritional Goals:  Underfeeding goal: 2778-2423 kcal/day (60-70% of full goal of 2276 kcal/day), >/= 130g protein per day (90% of full goal of >/= 145 grams per day), >/= 2L fluid/day per RD recommendations on 03/01/12   Current Nutrition:  Clinimix 5/15 at 83 ml/hr provides 100  gm protein/day (~71% goal) and an average of 1619kcal/day (101% underfeeding goal range)  Assessment: 52 y.o. M with history of ulcerative colitis presented to the hospital with back pain, fever and diarrhea. 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 TPN was initiated on 02/29/12 for nutrition support. Transferred to [facility] on 2/6 for continued management of care.   GI: Hx ulcerative colitis - previously in remission with mesalamine - developed toxic megacolon d/t cdiff colitis. Failed medical management, now POD#6 total colectomy. Started on TPN 2/5. No evidence of abdominal compartment syndrome per surgery but is at risk. +2.5 L drain output, 69m NG output Endo: No noted hx DM - CBGs 183-228 since last TPN started. Hyperglycemia likely related to multiple factors including TPN, critical illness and steroid administration (HC 50 mg/12h). Will add insulin to bag Lytes: K and Mg remain elevated despite NO electrolytes in TPN. Unsure why  Renal: AKI - Scr now trending back up to 1.45, CrCl~75. UOP 0.7 ml/kg/hr Pulm: VDRF. 94% sat on Fio2 40% Cards: Hypotensive requiring pressors for support. Vasopressin + phenylephrine weaned off. BP 81/58  receiving fluid bolus, HR 107 (Afib - new onset this admit). Continues on amiodarone drip.  Hepatobil: LFTs WNL. TG 101 (2/6), pre-albumin~3 (2/6), alb 1.4.  Neuro: Sedated, GCS 3, RASS -4 (goal 0) - fent/versed gtt ID: Flagyl IV  + Vanc po D#8 (2/3 >> current) for CDiff colitis now s/p colectomy/ileostomy. Vanc IV + aztreonam dc'd. Tmax 101, WBC down 29.9 Best Practices: SCDs, PPI IV, MC TPN Access: PICC (placed 02/28/12), L-IJ CVC (place 03/01/12) TPN day#: 6 (2/5 >> current)  Plan:  1. Continue Clinimix 5/15 at 23m/hr.  2. Lipids, MVI, TE on MWF only d/t national shortage 3. Add 24 units regular insulin to TPN. Monitor cbg's  4. F/u AM TPN labs  CDavonna Belling  PharmD, BCaliforniaPager [phone] 03/05/2012 8:00 AM

## 2012-03-05 NOTE — Progress Notes (Signed)
PULMONARY  / CRITICAL CARE MEDICINE  Name: BENNARD ZAFAR MRN: [id number] DOB: Sep 11, 1960    ADMISSION DATE:  02/26/2012 CONSULTATION DATE:  2/5  REFERRING MD :  Karilyn Cota  CHIEF COMPLAINT:  Acute resp failure   BRIEF PATIENT DESCRIPTION:  34 YOM admitted to [facility] for Cdiff on 2/2. Underwent decompressive cecostomy 2/3, initially was better, then worsened on 2/4 with increased abdominal distension and tenderness, decreased renal function.  He was taken back to the OR on 2/4 for total abdominal colectomy and end ileostomy.  Post op he developed ARDS and sepsis shock and was being treated for this.  The morning of 2/6 he had new onset of A-fib and transferred to [name].    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 2/6 - New A-fib and transfer to [facility] health 2/9- thora left 1200 exudative 2/10 hyperkalemia  LINES / TUBES: ETT - OSH 2/4>>> Foley-OSH 2/3>>> PICC OSH 2/4>>> Art Line - OSH 2/5>>>  CULTURES: BCx2 2/4>>>negative BCx2 2/3>>>negative UC 2/3- Negative MRSA PCR 2/3- Negative C diff 2/6>>>Positive  ANTIBIOTICS: Flagyl 2/3>>> Vanc 2/6 (IV) >>>2/7 azactam 2/6>>>2/7 Oral vanc (ok'd by surg) 2/6>>>  SUBJECTIVE:  Rate controlled  VITAL SIGNS: Temp:  [97.8 F (36.6 C)-101 F (38.3 C)] 97.8 F (36.6 C) (02/10 1237) Pulse Rate:  [100-110] 103 (02/10 1139) Resp:  [20-34] 34 (02/10 1139) BP: (81-105)/(52-68) 86/62 mmHg (02/10 1139) SpO2:  [92 %-99 %] 95 % (02/10 1139) FiO2 (%):  [40 %] 40 % (02/10 1139) HEMODYNAMICS: CVP:  [3 mmHg-8 mmHg] 8 mmHg VENTILATOR SETTINGS: Vent Mode:  [-] PRVC FiO2 (%):  [40 %] 40 % Set Rate:  [24 bmp] 24 bmp Vt Set:  [420 mL] 420 mL PEEP:  [5 cmH20] 5 cmH20 Pressure Support:  [10 cmH20] 10 cmH20 Plateau Pressure:  [9 cmH20-18 cmH20] 16 cmH20 INTAKE / OUTPUT: Intake/Output     02/09 0701 - 02/10 0700  02/10 0701 - 02/11 0700   I.V. (mL/kg) 804.6 (7.3) 193.5 (1.8)   NG/GT  20   IV Piggyback 1200 150   TPN 1162 415   Total Intake(mL/kg) 3166.6 (28.7) 778.5 (7.1)   Urine (mL/kg/hr) 2655 (1) 555 (0.8)   Emesis/NG output 75 (0) 100 (0.1)   Drains 2595 (1) 1040 (1.5)   Other 1200 (0.5)    Stool     Total Output 6525 1695   Net -3358.4 -916.5          PHYSICAL EXAMINATION: General:  Ill appearing ventilated patient Neuro: Sedated, rass -5 HEENT: ETT in place Cardiovascular: irregular Lungs: ronchi Abdomen: Distended, wound site clean, hypoBS Musculoskeletal: 2+ edema Skin: no rashes  LABS:  Recent Labs Lab 02/28/12 1244  02/28/12 1924  02/29/12 0439 02/29/12 0452  03/01/12 0455 03/01/12 0508  03/01/12 0514 03/01/12 0827  03/01/12 1725 03/02/12 0042 03/02/12 0500 03/03/12 0421 03/04/12 0504 03/04/12 0646 03/05/12 0430  HGB  --   --  13.7  --  14.5  --   --   --   --   --  11.3*  --   --   --  11.9* 12.6* 13.5 13.7  --  15.1  WBC  --   --  3.9*  --  14.2*  --   --   --   --   --  33.0*  --   --   --  35.6*  35.3* 27.6* 31.9*  --  29.9*  PLT  --   --  124*  --  113*  --   --   --   --   --  93*  --   --   --  76* 74* 94* 92*  --  104*  NA  --   --  138  < > 136  --   --   --   --   --  137  --   --   --  135 134* 135 134*  --  135  K  --   < > 5.0  < > 3.6  --   --   --   --   --  3.4*  --   --   --  4.6 4.7 5.2* 5.9*  --  6.2*  CL  --   --  117*  < > 111  --   --   --   --   --  106  --   --   --  106 106 105 104  --  107  CO2  --   --  15*  < > 15*  --   --   --   --   --  24  --   --   --  22 22 25 25   --  23  GLUCOSE  --   --  117*  < > 165*  --   --   --   --   --  215*  --   --   --  315* 333* 329* 234*  --  250*  BUN  --   --  37*  < > 39*  --   --   --   --   --  40*  --   --   --  39* 40* 52* 64*  --  72*  CREATININE  --   --  1.90*  < > 1.78*  --   --   --   --   --  1.38*  --   --   --  1.21 1.23 1.24 1.40*  --  1.45*  CALCIUM  --   --  5.7*  < > 6.3*  --    --   --   --   --  6.6*  --   --   --  6.4* 6.6* 6.9* 7.1*  --  6.8*  MG  --   --   --   --   --   --   --   --   --   < > 2.4  --   --   --  2.6* 2.6*  --  2.9*  --  2.8*  PHOS  --   --   --   < >  --   --   --   --   --   --  1.7*  --   --   --   --  2.6  --  3.2  --  3.3  AST  --   --  24  < > 41*  --   --   --   --   --  38*  --   --   --  28  --   --   --   --  25  ALT  --   --  10  < > 16  --   --   --   --   --  17  --   --   --  15  --   --   --   --  13  ALKPHOS  --   --  19*  < > 37*  --   --   --   --   --  54  --   --   --  80  --   --   --   --  203*  BILITOT  --   --  0.2*  < > 0.3  --   --   --   --   --  0.6  --   --   --  0.7  --   --   --   --  0.5  PROT  --   --  2.3*  < > 3.0*  --   --   --   --   --  3.8*  --   --   --  3.8*  --   --   --   --  3.9*  ALBUMIN  --   --  0.7*  < > 1.0*  --   --   --   --   --  1.8*  --   --   --  1.4*  --   --   --   --  1.1*  APTT  --   --   --   --   --   --   --   --   --   --   --  36  --   --   --   --   --   --   --   --   INR  --   --   --   --   --   --   --   --   --   --   --  1.56*  --   --   --   --   --   --   --   --   LATICACIDVEN 6.7*  --  3.6*  --   --  4.2*  --   --   --   --   --   --   --   --   --   --   --   --   --   --   TROPONINI  --   --   --   --   --   --   --   --   --   --   --   --   --   --  <0.30  --   --   --   --   --   PROCALCITON  --   --  21.86  --  31.67  --   --   --  20.70  --   --   --   --   --   --   --   --   --   --   --   PHART  --   --   --   < >  --   --   < > 7.366  --   --   --   --   < > 7.349* 7.302*  --   --   --  7.508*  --   PCO2ART  --   --   --   < >  --   --   < >  41.6  --   --   --   --   < > 39.0 47.5*  --   --   --  26.7*  --   PO2ART  --   --   --   < >  --   --   < > 112.0*  --   --   --   --   < > 94.0 89.2  --   --   --  143.0*  --   < > = values in this interval not displayed.  Recent Labs Lab 03/04/12 1959 03/05/12 0027 03/05/12 0426 03/05/12 0842 03/05/12 1229  GLUCAP  228* 227* 208* 231* 209*    Imaging: Dg Chest Port 1 View  03/05/2012  *RADIOLOGY REPORT*  Clinical Data: Evaluate endotracheal tube positioning, pleural effusions  PORTABLE CHEST - 1 VIEW  Comparison: 03/04/2012; 03/03/2012; 03/01/2012 02/27/2012;  Findings: Grossly unchanged cardiac silhouette and mediastinal contours.  Stable positioning of support apparatus.  Grossly unchanged small bilateral pleural effusions with associated bibasilar opacities, left greater than right.  Pulmonary vasculature remains indistinct.  No pneumothorax.  Unchanged bones.  IMPRESSION:  1.  Stable positioning of support apparatus.  No pneumothorax. 2.  Grossly unchanged findings of pulmonary edema, small bilateral effusions and bibasilar opacities, left greater than right, atelectasis versus infiltrate.   Original Report Authenticated By: Tacey Ruiz, MD    Dg Chest Port 1 View  03/04/2012  *RADIOLOGY REPORT*  Clinical Data: Left thoracentesis, follow-up  PORTABLE CHEST - 1 VIEW  Comparison: Portable chest x-ray from earlier today  Findings: There has been a decrease in volume of the left pleural effusion after left thoracentesis.  Haziness remains at both lung bases consistent with effusions and atelectasis.  Mild cardiomegaly is stable.  The endotracheal tube and left central venous line as well as the right PICC line are unchanged in position.  IMPRESSION: Decrease in volume of left pleural effusion after left thoracentesis.  No pneumothorax.   Original Report Authenticated By: Dwyane Dee, M.D.    Dg Chest Port 1 View  03/04/2012  *RADIOLOGY REPORT*  Clinical Data: Follow-up pleural effusions (  PORTABLE CHEST - 1 VIEW  Comparison: Chest radiograph 03/03/2012  Findings: Endotracheal tube and right PICC line are unchanged.  NG tube unchanged.  Stable cardiac silhouette.  The left heart border is obscured by dense air space disease.  There are bilateral pleural effusions greater on the left.  IMPRESSION:  1.  Stable support  apparatus. 2.  No significant change. 3.  Dense left lower lobe atelectasis and consolidation with effusion.   Original Report Authenticated By: Genevive Bi, M.D.      ASSESSMENT / PLAN:  PULMONARY A: Acute respiratory failure in setting of septic shock and volume overload. Lt pleural effusion. P:   Weaning attempt, cpap 6 ps 5-10, goal 2 hrs pcxr reviewed Pos balance as goal, see renal Follow am pcxr closely for re occurrence Avoid acidosis, repeat abg  CARDIOVASCULAR A: Septic Shock - Secondary to C-diff colitis; off pressors 2/08. Atrial Fibrillation with RVR. Low [place] score. Neg balance from JP's P: .  CVP 3, output up from jp drains Continue amiodarone, rate controlled Add ASA when able to take enteral meds Holding anticoagulation post op for now Follow cvp , may need bolus sback  RENAL A:  Acute Renal Failure >> pre renal from output Hypervolemia. 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 Hx of ulcerative colitis. Followed by Dr. Charna Elizabeth as outpt. Nutrition. P:   Post-op care per [facility] Continue TNA SUP with pepcid Avoid ppi with cdiff  HEMATOLOGIC A: Leukocytosis. Thrombocytopenia. P:  F/u CBC Improved plat trend scd for now Heparin for fib in future likely  INFECTIOUS A: C diff colitis. P:   Continue enteral vancomycin and IV flagyl Gi from [facility] called, recommend small volume vanc recal, will clear with surgery  ENDOCRINE A: Hyperglycemia. uncontrolled Relative adrenal insufficiency. P:   SSI Lantus 10 units 2/8 Change solucortef to 50 mg q12h on 2/9, reduce to 25 Increase insulin in tpn  NEUROLOGIC A:  Acute encephalopathy 2nd to sepsis. Poor response in WUA P:   Dc all sedation CT head  portable Avoid benzo for now  Updated family at bedside.   CC time 35 minutes.  Mcarthur Rossetti. Tyson Alias, MD, FACP Pgr: [phone] [facility]

## 2012-03-06 ENCOUNTER — Inpatient Hospital Stay (HOSPITAL_COMMUNITY): Payer: BC Managed Care – PPO

## 2012-03-06 DIAGNOSIS — M7989 Other specified soft tissue disorders: Secondary | ICD-10-CM

## 2012-03-06 LAB — CBC WITH DIFFERENTIAL/PLATELET
HCT: 40.2 % (ref 39.0–52.0)
Lymphs Abs: 1.5 10*3/uL (ref 0.7–4.0)
MCH: 31.4 pg (ref 26.0–34.0)
MCHC: 33.1 g/dL (ref 30.0–36.0)
MCV: 95 fL (ref 78.0–100.0)
Monocytes Absolute: 1.5 10*3/uL — ABNORMAL HIGH (ref 0.1–1.0)
Neutro Abs: 21.8 10*3/uL — ABNORMAL HIGH (ref 1.7–7.7)
Platelets: 107 10*3/uL — ABNORMAL LOW (ref 150–400)
RDW: 16.8 % — ABNORMAL HIGH (ref 11.5–15.5)

## 2012-03-06 LAB — COMPREHENSIVE METABOLIC PANEL
ALT: 15 U/L (ref 0–53)
Albumin: 0.9 g/dL — ABNORMAL LOW (ref 3.5–5.2)
Alkaline Phosphatase: 315 U/L — ABNORMAL HIGH (ref 39–117)
Chloride: 109 mEq/L (ref 96–112)
Potassium: 5.7 mEq/L — ABNORMAL HIGH (ref 3.5–5.1)
Sodium: 136 mEq/L (ref 135–145)
Total Bilirubin: 0.3 mg/dL (ref 0.3–1.2)
Total Protein: 3.4 g/dL — ABNORMAL LOW (ref 6.0–8.3)

## 2012-03-06 LAB — BASIC METABOLIC PANEL
BUN: 72 mg/dL — ABNORMAL HIGH (ref 6–23)
BUN: 70 mg/dL — ABNORMAL HIGH (ref 6–23)
Chloride: 109 mEq/L (ref 96–112)
Chloride: 109 mEq/L (ref 96–112)
GFR calc Af Amer: 64 mL/min — ABNORMAL LOW (ref >90)
GFR calc Af Amer: 66 mL/min — ABNORMAL LOW (ref >90)
Potassium: 5.6 mEq/L — ABNORMAL HIGH (ref 3.5–5.1)
Potassium: 4.7 mEq/L (ref 3.5–5.1)
Sodium: 136 mEq/L (ref 135–145)

## 2012-03-06 LAB — POCT I-STAT 3, ART BLOOD GAS (G3+)
Patient temperature: 97.1
pH, Arterial: 7.435 (ref 7.350–7.450)

## 2012-03-06 LAB — GLUCOSE, CAPILLARY
Glucose-Capillary: 201 mg/dL — ABNORMAL HIGH (ref 70–99)
Glucose-Capillary: 236 mg/dL — ABNORMAL HIGH (ref 70–99)
Glucose-Capillary: 264 mg/dL — ABNORMAL HIGH (ref 70–99)

## 2012-03-06 MED ORDER — SODIUM CHLORIDE 0.9 % IV SOLN
1.0000 g | Freq: Once | INTRAVENOUS | Status: AC
  Administered 2012-03-06: 1 g via INTRAVENOUS
  Filled 2012-03-06: qty 10

## 2012-03-06 MED ORDER — HYDROCORTISONE SOD SUCCINATE 100 MG IJ SOLR
50.0000 mg | Freq: Four times a day (QID) | INTRAMUSCULAR | Status: DC
  Administered 2012-03-06 – 2012-03-11 (×20): 50 mg via INTRAVENOUS
  Filled 2012-03-06 (×24): qty 1

## 2012-03-06 MED ORDER — FAMOTIDINE 10 MG/ML IV SOLN
INTRAVENOUS | Status: AC
  Administered 2012-03-06: 18:00:00 via INTRAVENOUS
  Filled 2012-03-06: qty 2000

## 2012-03-06 MED ORDER — SODIUM CHLORIDE 0.9 % IV BOLUS (SEPSIS)
500.0000 mL | Freq: Once | INTRAVENOUS | Status: AC
  Administered 2012-03-06: 500 mL via INTRAVENOUS

## 2012-03-06 MED ORDER — SODIUM CHLORIDE 0.9 % IV BOLUS (SEPSIS)
1000.0000 mL | Freq: Once | INTRAVENOUS | Status: AC
  Administered 2012-03-06: 1000 mL via INTRAVENOUS

## 2012-03-06 MED ORDER — SODIUM POLYSTYRENE SULFONATE 15 GM/60ML PO SUSP
30.0000 g | Freq: Once | ORAL | Status: AC
  Administered 2012-03-06: 30 g
  Filled 2012-03-06: qty 120

## 2012-03-06 NOTE — Progress Notes (Signed)
CRITICAL VALUE ALERT  Critical value received:  Ca 5.9  Date of notification:  2/11  Time of notification:  2145  Critical value read back:yes  Nurse who received alert:  A.Lavonna Rua  MD notified (1st page):    Time of first page:    MD notified (2nd page):  Time of second page:  Responding MD:  Conception Chancy MD  Time MD responded:  [phone]

## 2012-03-06 NOTE — Progress Notes (Signed)
CRITICAL VALUE ALERT  Critical value received:  Ca 6.1  Date of notification:  2/11  Time of notification:  0020  Critical value read back:yes  Nurse who received alert:  A. Ezequiel Ganser RN  MD notified (1st page):    Time of first page:    MD notified (2nd page):  Time of second page:  Responding MD:  MD Zubelevitsky  Time MD responded:  [id number]

## 2012-03-06 NOTE — Progress Notes (Signed)
CRITICAL VALUE ALERT  Critical value received:  Ca 6.1  Date of notification:  2/11   Time of notification:  2174  Critical value read back:yes  Nurse who received alert:  A.Ezequiel Ganser RN  MD notified (1st page):    Time of first page:   MD notified (2nd page):  Time of second page:  Responding MD:  Darrick Grinder MD  Time MD responded:  [phone]

## 2012-03-06 NOTE — Progress Notes (Signed)
7 Days Post-Op  Subjective: Remains critically ill, intubated  Objective: Vital signs in last 24 hours: Temp:  [97.1 F (36.2 C)-100.2 F (37.9 C)] 97.3 F (36.3 C) (02/11 0739) Pulse Rate:  [95-110] 100 (02/11 0730) Resp:  [23-34] 25 (02/11 0730) BP: (69-97)/(47-66) 83/56 mmHg (02/11 0730) SpO2:  [93 %-96 %] 94 % (02/11 0730) FiO2 (%):  [40 %] 40 % (02/11 0730) Last BM Date: 03/05/12  Intake/Output from previous day: 02/10 0701 - 02/11 0700 In: 10206.1 [I.V.:5637.1; NG/GT:20; IV Piggyback:2510; FYB:0175] Out: 7150 [Urine:1985; Emesis/NG output:600; Drains:4115; Stool:450] Intake/Output this shift:    Abdomen soft, ostomy still with viability  Lab Results:   Recent Labs  03/05/12 0430 03/06/12 0449  WBC 29.9* 25.1*  HGB 15.1 13.3  HCT 44.3 40.2  PLT 104* 107*   BMET  Recent Labs  03/05/12 2320 03/06/12 0449  NA 136 136  K 5.6* 5.7*  CL 109 109  CO2 23 23  GLUCOSE 235* 243*  BUN 72* 71*  CREATININE 1.42* 1.42*  CALCIUM 6.1* 6.2*   PT/INR No results found for this basename: LABPROT, INR,  in the last 72 hours ABG  Recent Labs  03/05/12 1425 03/06/12 0640  PHART 7.332* 7.435  HCO3 20.6 18.9*    Studies/Results: Ct Abdomen Pelvis Wo Contrast  03/05/2012  *RADIOLOGY REPORT*  Clinical Data: Abscess.  Assess JP drain location.  CT ABDOMEN AND PELVIS WITHOUT CONTRAST  Technique:  Multidetector CT imaging of the abdomen and pelvis was performed following the standard protocol without intravenous contrast.  Comparison: 02/26/2012  Findings: There are moderate to large bilateral pleural effusions with compressive atelectasis in the lower lobes.  Heart is normal size.  Partially open midline abdominal incision noted.  There is a right lower quadrant ostomy.  The patient is status post colectomy. Left JP drain is in place with the tip in the anterior left upper quadrant along the greater curvature of the stomach.  Fluid noted around the spleen.  Question loculated  ascites adjacent to the spleen versus subcapsular fluid collection.  No evidence of bowel obstruction.  NG tube is in place within the stomach.  There appears to be gastric wall thickening, but the stomach is decompressed and difficult to evaluate.  Liver, pancreas, adrenals and kidneys have an unremarkable unenhanced appearance.  There is a JP drain within the right side of the abdomen within the right paracolic gutter just inferior to the liver tip.  Foley catheter is present in the bladder which is decompressed.  No acute bony abnormality.  IMPRESSION: Bilateral JP drains in place.  The left JP drain courses superiorly in the left paracolic gutter with the tip passing anteriorly and lying near the greater curvature of the stomach.  The right JP drain is in the right paracolic gutter with the tip just inferior to the liver.  Fluid in the left upper quadrant adjacent to the spleen.  This could represent a subcapsular fluid collection or loculated ascites adjacent to the spleen.  This measures very low density and is therefore not suggestive of a subcapsular hematoma.  Moderate to large bilateral pleural effusions with compressive atelectasis in the lower lobes.  Changes from prior colectomy.  Partially open midline incision noted.  No underlying fluid collection.   Original Report Authenticated By: Rolm Baptise, M.D.    Ct Head Wo Contrast  03/05/2012  *RADIOLOGY REPORT*  Clinical Data: Stroke  CT HEAD WITHOUT CONTRAST  Technique:  Contiguous axial images were obtained from the base of  the skull through the vertex without contrast.  Comparison: None.  Findings: The brain does not show any evidence of acute infarction, mass lesion, hemorrhage, hydrocephalus or extra-axial collection. Brainstem appears normal.  There is an old cerebellar infarction on the right.  The cerebral hemispheres are normal except for some old white matter infarctions in the left frontoparietal region.  Bony structures are unremarkable.   Sinuses are clear.  There is a small amount of fluid in the mastoid air cells.  IMPRESSION: No acute finding.  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   Original Report Authenticated By: Nelson Chimes, M.D.    Dg Chest Port 1 View  03/05/2012  *RADIOLOGY REPORT*  Clinical Data: Evaluate endotracheal tube positioning, pleural effusions  PORTABLE CHEST - 1 VIEW  Comparison: 03/04/2012; 03/03/2012; 03/01/2012 02/27/2012;  Findings: Grossly unchanged cardiac silhouette and mediastinal contours.  Stable positioning of support apparatus.  Grossly unchanged small bilateral pleural effusions with associated bibasilar opacities, left greater than right.  Pulmonary vasculature remains indistinct.  No pneumothorax.  Unchanged bones.  IMPRESSION:  1.  Stable positioning of support apparatus.  No pneumothorax. 2.  Grossly unchanged findings of pulmonary edema, small bilateral effusions and bibasilar opacities, left greater than right, atelectasis versus infiltrate.   Original Report Authenticated By: Jake Seats, MD    Dg Chest Port 1 View  03/04/2012  *RADIOLOGY REPORT*  Clinical Data: Left thoracentesis, follow-up  PORTABLE CHEST - 1 VIEW  Comparison: Portable chest x-ray from earlier today  Findings: There has been a decrease in volume of the left pleural effusion after left thoracentesis.  Haziness remains at both lung bases consistent with effusions and atelectasis.  Mild cardiomegaly is stable.  The endotracheal tube and left central venous line as well as the right PICC line are unchanged in position.  IMPRESSION: Decrease in volume of left pleural effusion after left thoracentesis.  No pneumothorax.   Original Report Authenticated By: Ivar Drape, M.D.     Anti-infectives: Anti-infectives   Start     Dose/Rate Route Frequency Ordered Stop   03/05/12 2200  metroNIDAZOLE (FLAGYL) IVPB 500 mg    Comments:  First dose ASAP   500 mg 100 mL/hr over 60 Minutes  Intravenous Every 8 hours 03/05/12 1346     03/02/12 1200  vancomycin (VANCOCIN) 50 mg/mL oral solution 500 mg     500 mg Oral 4 times per day 03/02/12 0913     03/01/12 1445  vancomycin (VANCOCIN) 50 mg/mL oral solution 500 mg  Status:  Discontinued     500 mg Oral 3 times per day 03/01/12 1340 03/02/12 0913   03/01/12 1400  aztreonam (AZACTAM) 1 g in dextrose 5 % 50 mL IVPB  Status:  Discontinued     1 g 100 mL/hr over 30 Minutes Intravenous 3 times per day 03/01/12 1356 03/02/12 1146   03/01/12 1300  vancomycin (VANCOCIN) 1,250 mg in sodium chloride 0.9 % 250 mL IVPB  Status:  Discontinued     1,250 mg 166.7 mL/hr over 90 Minutes Intravenous Every 12 hours 03/01/12 1127 03/02/12 1146   02/29/12 0600  ciprofloxacin (CIPRO) IVPB 400 mg     400 mg 200 mL/hr over 60 Minutes Intravenous On call to O.R. 02/28/12 1409 02/28/12 1438   02/28/12 2200  vancomycin (VANCOCIN) 500 mg in sodium chloride irrigation 0.9 % 60 mL ENEMA  Status:  Discontinued     500 mg Rectal 2 times daily 02/28/12 1504  02/28/12 1817   02/28/12 1200  vancomycin (VANCOCIN) 50 mg/mL oral solution 500 mg  Status:  Discontinued    Comments:  First dose ASAP   500 mg Per Tube 4 times per day 02/28/12 0814 02/28/12 1817   02/28/12 1000  vancomycin (VANCOCIN) IVPB 1000 mg/200 mL premix  Status:  Discontinued     1,000 mg 200 mL/hr over 60 Minutes Intravenous Every 12 hours 02/28/12 0845 03/01/12 1127   02/28/12 1000  vancomycin (VANCOCIN) 500 mg in sodium chloride irrigation 0.9 % 100 mL ENEMA  Status:  Discontinued     500 mg Rectal 2 times daily 02/28/12 0923 02/28/12 1504   02/27/12 1600  metroNIDAZOLE (FLAGYL) IVPB 500 mg  Status:  Discontinued     500 mg 100 mL/hr over 60 Minutes Intravenous Every 8 hours 02/27/12 1546 02/27/12 2000   02/27/12 1600  ciprofloxacin (CIPRO) IVPB 400 mg  Status:  Discontinued     400 mg 200 mL/hr over 60 Minutes Intravenous Every 12 hours 02/27/12 1547 02/27/12 2000   02/27/12 1545   metroNIDAZOLE (FLAGYL) IVPB 500 mg  Status:  Discontinued     500 mg 100 mL/hr over 60 Minutes Intravenous  Once 02/27/12 1535 02/28/12 0843   02/27/12 1545  vancomycin (VANCOCIN) 500 mg in sodium chloride 0.9 % 100 mL IVPB  Status:  Discontinued     500 mg 100 mL/hr over 60 Minutes Intravenous  Once 02/27/12 1535 02/27/12 1538   02/27/12 1545  ciprofloxacin (CIPRO) IVPB 400 mg  Status:  Discontinued     400 mg 200 mL/hr over 60 Minutes Intravenous  Once 02/27/12 1538 02/28/12 0843   02/27/12 1415  vancomycin (VANCOCIN) 500 mg in sodium chloride 0.9 % 100 mL IVPB     500 mg 100 mL/hr over 60 Minutes Intravenous  Once 02/27/12 1402 02/27/12 1411   02/27/12 1400  vancomycin (VANCOCIN) powder 500 mg  Status:  Discontinued     500 mg Other To Surgery 02/27/12 1359 02/27/12 1402   02/27/12 1200  metroNIDAZOLE (FLAGYL) IVPB 500 mg  Status:  Discontinued    Comments:  First dose ASAP   500 mg 100 mL/hr over 60 Minutes Intravenous Every 6 hours 02/27/12 0953 03/05/12 1346   02/27/12 1200  vancomycin (VANCOCIN) 50 mg/mL oral solution 250 mg  Status:  Discontinued    Comments:  First dose ASAP   250 mg Oral 4 times per day 02/27/12 0953 02/28/12 0814   02/26/12 1700  oseltamivir (TAMIFLU) capsule 75 mg     75 mg Oral  Once 02/26/12 1651 02/26/12 1715      Assessment/Plan: s/p Procedure(s): TOTAL COLECTOMY (N/A) ILEOSTOMY (N/A)  Will continue to closely monitor ostomy which is still viable WBC and acidosis improved today CT without evidence of intrabdominal abscess  LOS: 9 days    Mayci Haning A 03/06/2012

## 2012-03-06 NOTE — Progress Notes (Signed)
PULMONARY  / CRITICAL CARE MEDICINE  Name: Connor Small MRN: [id number] DOB: [birth year]    ADMISSION DATE:  02/26/2012 CONSULTATION DATE:  2/5  REFERRING MD :  Karilyn Cota  CHIEF COMPLAINT:  Acute resp failure   BRIEF PATIENT DESCRIPTION:  45 YOM admitted to [facility] for Cdiff on 2/2. Underwent decompressive cecostomy 2/3, initially was better, then worsened on 2/4 with increased abdominal distension and tenderness, decreased renal function.  He was taken back to the OR on 2/4 for total abdominal colectomy and end ileostomy.  Post op he developed ARDS and sepsis shock and was being treated for this.  The morning of 2/6 he had new onset of A-fib and transferred to [facility].    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 2/10- CT abdo /pelvis- small hematoma likely subcapsular spleen, JPs wnl 2/11-borderline BP, pos balance  LINES / TUBES: ETT - OSH 2/4>>> Foley-OSH 2/3>>> PICC OSH 2/4>>> Art Line - OSH 2/5>>>  CULTURES: BCx2 2/4>>>negative BCx2 2/3>>>negative UC 2/3- Negative MRSA PCR 2/3- Negative C diff 2/6>>>Positive  ANTIBIOTICS: Flagyl 2/3>>> Vanc 2/6 (IV) >>>2/7 azactam 2/6>>>2/7 Oral vanc 2/6>>>  SUBJECTIVE:  Pos  baalnce  VITAL SIGNS: Temp:  [97.1 F (36.2 C)-100.2 F (37.9 C)] 97.1 F (36.2 C) (02/11 0435) Pulse Rate:  [95-110] 100 (02/11 0600) Resp:  [23-34] 23 (02/11 0600) BP: (69-97)/(47-66) 90/52 mmHg (02/11 0600) SpO2:  [93 %-96 %] 94 % (02/11 0600) FiO2 (%):  [40 %] 40 % (02/11 0355) HEMODYNAMICS: CVP:  [7 mmHg-10 mmHg] 9 mmHg VENTILATOR SETTINGS: Vent Mode:  [-] PRVC FiO2 (%):  [40 %] 40 % Set Rate:  [24 bmp-28 bmp] 28 bmp Vt  Set:  [420 mL] 420 mL PEEP:  [5 cmH20] 5 cmH20 Pressure Support:  [10 cmH20] 10 cmH20 Plateau Pressure:  [11 cmH20-16 cmH20] 14 cmH20 INTAKE / OUTPUT: Intake/Output     02/10 0701 - 02/11 0700 02/11 0701 - 02/12 0700   I.V. (mL/kg) 5637.1 (51.2)    NG/GT 20    IV Piggyback 2510    TPN 2039    Total Intake(mL/kg) 10206.1 (92.6)    Urine (mL/kg/hr) 1985 (0.8)    Emesis/NG output 600 (0.2)    Drains 4115 (1.6)    Other     Stool 450 (0.2)    Total Output 7150     Net +3056.1          Stool Occurrence 1 x      PHYSICAL EXAMINATION: General:  Ill appearing ventilated patient Neuro: Sedated, rass -3, opening eyes spont HEENT: ETT in place Cardiovascular: irregular s1 s 2 Lungs: ronchi diffuse Abdomen: Distended, wound site clean, hypoBS , about unchanged Musculoskeletal: 2+ edema Skin: no rashes  LABS:  Recent Labs Lab 02/28/12 1244  02/28/12 1924  02/29/12 0439 02/29/12 0452  03/01/12 0455 03/01/12 0508  03/01/12 0827  03/02/12 0042 03/02/12 0500  03/04/12 0504 03/04/12 0646 03/05/12 0430 03/05/12 1300 03/05/12 1425 03/05/12 2320 03/06/12 0449 03/06/12 0640  HGB  --   < > 13.7  --  14.5  --   --   --   --   < >  --   --  11.9* 12.6*  < > 13.7  --  15.1  --   --   --  13.3  --   WBC  --   < > 3.9*  --  14.2*  --   --   --   --   < >  --   --  35.6* 35.3*  < > 31.9*  --  29.9*  --   --   --  25.1*  --   PLT  --   < > 124*  --  113*  --   --   --   --   < >  --   --  76* 74*  < > 92*  --  104*  --   --   --  107*  --   NA  --   --  138  < > 136  --   --   --   --   < >  --   --  135 134*  < > 134*  --  135 137  --  136 136  --   K  --   < > 5.0  < > 3.6  --   --   --   --   < >  --   --  4.6 4.7  < > 5.9*  --  6.2* 6.1*  --  5.6* 5.7*  --   CL  --   --  117*  < > 111  --   --   --   --   < >  --   --  106 106  < > 104  --  107 109  --  109 109  --   CO2  --   --  15*  < > 15*  --   --   --   --   < >  --   --  22 22  < > 25  --  23 25  --  23 23  --   GLUCOSE  --    --  117*  < > 165*  --   --   --   --   < >  --   --  315* 333*  < > 234*  --  250* 233*  --  235* 243*  --   BUN  --   --  37*  < > 39*  --   --   --   --   < >  --   --  39* 40*  < > 64*  --  72* 78*  --  72* 71*  --   CREATININE  --   --  1.90*  < > 1.78*  --   --   --   --   < >  --   --  1.21 1.23  < > 1.40*  --  1.45* 1.54*  --  1.42* 1.42*  --   CALCIUM  --   --  5.7*  < > 6.3*  --   --   --   --   < >  --   --  6.4* 6.6*  < > 7.1*  --  6.8* 6.6*  --  6.1* 6.2*  --   MG  --   --   --   --   --   --   --   --   --   < >  --   --  2.6* 2.6*  --  2.9*  --  2.8*  --   --   --   --   --   PHOS  --   --   --   < >  --   --   --   --   --   < >  --   --   --  2.6  --  3.2  --  3.3  --   --   --   --   --   AST  --   --  24  < > 41*  --   --   --   --   < >  --   --  28  --   --   --   --  25  --   --   --  35  --   ALT  --   --  10  < > 16  --   --   --   --   < >  --   --  15  --   --   --   --  13  --   --   --  15  --   ALKPHOS  --   --  19*  < > 37*  --   --   --   --   < >  --   --  80  --   --   --   --  203*  --   --   --  315*  --   BILITOT  --   --  0.2*  < > 0.3  --   --   --   --   < >  --   --  0.7  --   --   --   --  0.5  --   --   --  0.3  --   PROT  --   --  2.3*  < > 3.0*  --   --   --   --   < >  --   --  3.8*  --   --   --   --  3.9*  --   --   --  3.4*  --   ALBUMIN  --   --  0.7*  < > 1.0*  --   --   --   --   < >  --   --  1.4*  --   --   --   --  1.1*  --   --   --  0.9*  --   APTT  --   --   --   --   --   --   --   --   --   --  36  --   --   --   --   --   --   --   --   --   --   --   --   INR  --   --   --   --   --   --   --   --   --   --  1.56*  --   --   --   --   --   --   --   --   --   --   --   --   LATICACIDVEN 6.7*  --  3.6*  --   --  4.2*  --   --   --   --   --   --   --   --   --   --   --   --   --   --   --   --   --   TROPONINI  --   --   --   --   --   --   --   --   --   --   --   --  <0.30  --   --   --   --   --   --   --   --   --   --   PROCALCITON   --   --  21.86  --  31.67  --   --   --  20.70  --   --   --   --   --   --   --   --   --   --   --   --   --   --   PHART  --   --   --   < >  --   --   < > 7.366  --   --   --   < > 7.302*  --   --   --  7.508*  --   --  7.332*  --   --  7.435  PCO2ART  --   --   --   < >  --   --   < > 41.6  --   --   --   < > 47.5*  --   --   --  26.7*  --   --  38.8  --   --  27.9*  PO2ART  --   --   --   < >  --   --   < > 112.0*  --   --   --   < > 89.2  --   --   --  143.0*  --   --  85.0  --   --  50.0*  < > = values in this interval not displayed.  Recent Labs Lab 03/05/12 1229 03/05/12 1632 03/05/12 2025 03/05/12 2332 03/06/12 0435  GLUCAP 209* 197* 226* 206* 201*    Imaging: Ct Abdomen Pelvis Wo Contrast  03/05/2012  *RADIOLOGY REPORT*  Clinical Data: Abscess.  Assess JP drain location.  CT ABDOMEN AND PELVIS WITHOUT CONTRAST  Technique:  Multidetector CT imaging of the abdomen and pelvis was performed following the standard protocol without intravenous contrast.  Comparison: 02/26/2012  Findings: There are moderate to large bilateral pleural effusions with compressive atelectasis in the lower lobes.  Heart is normal size.  Partially open midline abdominal incision noted.  There is a right lower quadrant ostomy.  The patient is status post colectomy. Left JP drain is in place with the tip in the anterior left upper quadrant along the greater curvature of the stomach.  Fluid noted around the spleen.  Question loculated ascites adjacent to the spleen versus subcapsular fluid collection.  No evidence of bowel obstruction.  NG tube is in place within the stomach.  There appears to be gastric wall thickening, but the stomach is decompressed and difficult to evaluate.  Liver, pancreas, adrenals and kidneys have an unremarkable unenhanced appearance.  There is a JP drain within the right side of the  abdomen within the right paracolic gutter just inferior to the liver tip.  Foley catheter is present in the  bladder which is decompressed.  No acute bony abnormality.  IMPRESSION: Bilateral JP drains in place.  The left JP drain courses superiorly in the left paracolic gutter with the tip passing anteriorly and lying near the greater curvature of the stomach.  The right JP drain is in the right paracolic gutter with the tip just inferior to the liver.  Fluid in the left upper quadrant adjacent to the spleen.  This could represent a subcapsular fluid collection or loculated ascites adjacent to the spleen.  This measures very low density and is therefore not suggestive of a subcapsular hematoma.  Moderate to large bilateral pleural effusions with compressive atelectasis in the lower lobes.  Changes from prior colectomy.  Partially open midline incision noted.  No underlying fluid collection.   Original Report Authenticated By: Charlett Nose, M.D.    Ct Head Wo Contrast  03/05/2012  *RADIOLOGY REPORT*  Clinical Data: Stroke  CT HEAD WITHOUT CONTRAST  Technique:  Contiguous axial images were obtained from the base of the skull through the vertex without contrast.  Comparison: None.  Findings: The brain does not show any evidence of acute infarction, mass lesion, hemorrhage, hydrocephalus or extra-axial collection. Brainstem appears normal.  There is an old cerebellar infarction on the right.  The cerebral hemispheres are normal except for some old white matter infarctions in the left frontoparietal region.  Bony structures are unremarkable.  Sinuses are clear.  There is a small amount of fluid in the mastoid air cells.  IMPRESSION: No acute finding.  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   Original Report Authenticated By: Paulina Fusi, M.D.    Dg Chest Port 1 View  03/05/2012  *RADIOLOGY REPORT*  Clinical Data: Evaluate endotracheal tube positioning, pleural effusions  PORTABLE CHEST - 1 VIEW  Comparison: 03/04/2012; 03/03/2012; 03/01/2012 02/27/2012;  Findings: Grossly  unchanged cardiac silhouette and mediastinal contours.  Stable positioning of support apparatus.  Grossly unchanged small bilateral pleural effusions with associated bibasilar opacities, left greater than right.  Pulmonary vasculature remains indistinct.  No pneumothorax.  Unchanged bones.  IMPRESSION:  1.  Stable positioning of support apparatus.  No pneumothorax. 2.  Grossly unchanged findings of pulmonary edema, small bilateral effusions and bibasilar opacities, left greater than right, atelectasis versus infiltrate.   Original Report Authenticated By: Tacey Ruiz, MD    Dg Chest Port 1 View  03/04/2012  *RADIOLOGY REPORT*  Clinical Data: Left thoracentesis, follow-up  PORTABLE CHEST - 1 VIEW  Comparison: Portable chest x-ray from earlier today  Findings: There has been a decrease in volume of the left pleural effusion after left thoracentesis.  Haziness remains at both lung bases consistent with effusions and atelectasis.  Mild cardiomegaly is stable.  The endotracheal tube and left central venous line as well as the right PICC line are unchanged in position.  IMPRESSION: Decrease in volume of left pleural effusion after left thoracentesis.  No pneumothorax.   Original Report Authenticated By: Dwyane Dee, M.D.      ASSESSMENT / PLAN:  PULMONARY A: Acute respiratory failure in setting of septic shock and volume overload. Lt pleural effusion. P:   Weaning attempt, cpap 5 , ps 5-10 C T bases with atx/effusions, no role lasix today with borderline bP and intravascular depletion 2/10 pcxr in am Keep ph corrected with mild hyperK   CARDIOVASCULAR A:  Septic Shock - Secondary to C-diff colitis; off pressors 2/08. Atrial Fibrillation with RVR. Low [place] score. High output from JP's P: .  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 Hx of ulcerative colitis. Followed by Dr. Charna Elizabeth as outpt. Nutrition. HIGH output from JP's P:   Post-op care per [facility] Continue TNA pepcid Avoid ppi with cdiff CT reviewed  HEMATOLOGIC A: Leukocytosis. Thrombocytopenia. P:  F/u CBC in am  Improved plat trend slowly scd Heparin for fib in future likely  INFECTIOUS A: C diff colitis P:   Continue enteral vancomycin and IV flagyl Gi from [facility] called, recommend small volume vanc recal, surgery concern blow out risk, hold  ENDOCRINE A: Hyperglycemia. uncontrolled Relative adrenal insufficiency. P:   SSI Lantus 10 units 2/8 Change solucortef to full stress dose with borderline BP Increase insulin in tpn further, last glu 201  NEUROLOGIC A:  Acute encephalopathy 2nd to sepsis. Poor response in [facility] CT neg acute, likely residual sedation P:   Continue to hold all sedation CT head portable reviewed  Updated family at bedside.   CC time 30 minutes.  Mcarthur Rossetti. Tyson Alias, MD, FACP Pgr: [phone]  Pulmonary & Critical Care

## 2012-03-06 NOTE — Progress Notes (Signed)
BMP reviewed   Calcium low at 5.9 but corrected  Is 8.4.

## 2012-03-06 NOTE — Progress Notes (Signed)
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 Penicillins Other (See Comments)    Heart rate changes    Patient Measurements: Height: 5' 9"  (175.3 cm) Weight: 242 lb 15.2 oz (110.2 kg) IBW/kg (Calculated) : 70.7  Vital Signs: Temp: 97.3 F (36.3 C) (02/11 0739) Temp src: Oral (02/11 0739) BP: 83/56 mmHg (02/11 0730) Pulse Rate: 100 (02/11 0730) Intake/Output from previous day: 02/10 0701 - 02/11 0700 In: 10206.1 [I.V.:5637.1; NG/GT:20; IV Piggyback:2510; OVF:6433] Out: 2951 [Urine:1985; Emesis/NG output:600; Drains:4115; Stool:450] Intake/Output from this shift: Total I/O In: 229.7 [I.V.:136.7; TPN:93] Out: 275 [Urine:125; Drains:150]  Labs:  Recent Labs  03/04/12 0504 03/05/12 0430 03/06/12 0449  WBC 31.9* 29.9* 25.1*  HGB 13.7 15.1 13.3  HCT 40.5 44.3 40.2  PLT 92* 104* 107*     Recent Labs  03/04/12 0504 03/05/12 0430 03/05/12 1300 03/05/12 2320 03/06/12 0449  NA 134* 135 137 136 136  K 5.9* 6.2* 6.1* 5.6* 5.7*  CL 104 107 109 109 109  CO2 25 23 25 23 23   GLUCOSE 234* 250* 233* 235* 243*  BUN 64* 72* 78* 72* 71*  CREATININE 1.40* 1.45* 1.54* 1.42* 1.42*  CALCIUM 7.1* 6.8* 6.6* 6.1* 6.2*  MG 2.9* 2.8*  --   --   --   PHOS 3.2 3.3  --   --   --   PROT  --  3.9*  --   --  3.4*  ALBUMIN  --  1.1*  --   --  0.9*  AST  --  25  --   --  35  ALT  --  13  --   --  15  ALKPHOS  --  203*  --   --  315*  BILITOT  --  0.5  --   --  0.3  PREALBUMIN  --  12.9*  --   --   --   TRIG  --  154*  --   --   --   CHOL  --  74  --   --   --    Estimated Creatinine Clearance: 73.6 ml/min (by C-G formula based on Cr of 1.42).    Recent Labs  03/05/12 2025 03/05/12 2332 03/06/12 0435  GLUCAP 226* 206* 201*   Insulin Requirements in the past 12 hours:  64 units insulin R in TPN + 28 units Novolog with SSI (since 1800 new  bag) + 10 units Lantus   Nutritional Goals:  Underfeeding goal: 8841-6606 kcal/day (60-70% of full goal of 2276 kcal/day), >/= 130g protein per day (90% of full goal of >/= 145 grams per day), >/= 2L fluid/day per RD recommendations on 03/01/12   Current Nutrition:  Clinimix 5/15 at 83 ml/hr provides 100 gm protein/day (~71% goal) and an average of 1619kcal/day (101% underfeeding goal range)  Assessment: 52 y.o. M with history of ulcerative colitis presented to the hospital with back pain, fever and diarrhea. 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 TPN was initiated on 02/29/12 for nutrition support. Transferred to [facility] on 2/6 for continued management of care.   GI: Hx ulcerative colitis - previously in remission with mesalamine - developed toxic megacolon d/t cdiff colitis. Failed medical management, now POD#7 total colectomy with end ileuostomy. Started on TPN 2/5. No evidence of abdominal compartment syndrome per  surgery but is at risk. Drains 3915 out, NG 400 out, stool 450 out last 24h. Endo: No noted hx DM - CBGs 201-226 since last TPN started. Hyperglycemia likely related to multiple factors including TPN, critical illness and steroid administration (HC 50 mg/12h). Can increase insulin in TPN bag. Lytes: K=5.7 and Mg remain elevated despite NO electrolytes in TPN. Renal function slowly improving. Calcium gluconate 1g IV this am for Ca 6.2 (adjusted =8.68 with low albumin) Renal: AKI - Scr now trending back up to 1.42, CrCl~75. UOP 0.8 ml/kg/hr.Bicarb drip at 49m/hr for acidosis.  Pulm: VDRF. 94% sat on Fio2 40% Cards: Hypotensive BP 83/56, HR 100 (Afib - new onset this admit). Continues on amiodarone drip.  Hepatobil: AST/ALT ok. Alkphos jumped to 315. TG 101(2/6) now up to 154 (2/10), pre-albumin~3 (2/6) now up 12.9 (2/10) , alb 1.4 down to 0.9. Neuro: Sedated with VDRF. Encephalopathy. CT head  due to not waking up. ID: Flagyl IV  + Vanc po D#9 (2/3 >> current) for CDiff colitis now s/p colectomy/ileostomy.Tmax 100.2, WBC down 25.1. CT= no intra-bdominal abscess noted. Best Practices: SCDs, MC, IV PPI changed to IV Famotidine TPN Access: PICC (placed 02/28/12), L-IJ CVC (place 03/01/12) TPN day#: 7 (2/5 >> current) IVF: 1/2 NS at 538mhr  Plan:  1. Continue Clinimix 5/15 at 8327mr.  2. Lipids, MVI, TE on MWF only d/t national shortage 3. Increase insulin in TPN to 4. Add IV pepcid to TPN bag.  Enya Bureau S. RobAlford HighlandharmD, BCPCarolinas Healthcare System Kings Mountaininical Staff Pharmacist Pager [phone]/11/2012 8:31 AM

## 2012-03-06 NOTE — Progress Notes (Signed)
VASCULAR LAB PRELIMINARY  PRELIMINARY  PRELIMINARY  PRELIMINARY  Bilateral upper extremity venous duplex completed.    Preliminary report:  Superficial thrombosis noted in the right basilic vein.   No obvious evidence of DVT bilaterally.  Bilateral jugular veins not imaged due to patient position.  Left subclavian vein not visualized.  Jerett Odonohue, RVT 03/06/2012, 9:00 AM

## 2012-03-07 ENCOUNTER — Inpatient Hospital Stay (HOSPITAL_COMMUNITY): Payer: BC Managed Care – PPO

## 2012-03-07 LAB — CBC WITH DIFFERENTIAL/PLATELET
Basophils Relative: 1 % (ref 0–1)
Eosinophils Relative: 0 % (ref 0–5)
Hemoglobin: 14.7 g/dL (ref 13.0–17.0)
MCH: 32.3 pg (ref 26.0–34.0)
MCV: 94.7 fL (ref 78.0–100.0)
Monocytes Absolute: 1.3 10*3/uL — ABNORMAL HIGH (ref 0.1–1.0)
Monocytes Relative: 5 % (ref 3–12)
Neutrophils Relative %: 90 % — ABNORMAL HIGH (ref 43–77)
RBC: 4.55 MIL/uL (ref 4.22–5.81)
WBC: 26.7 10*3/uL — ABNORMAL HIGH (ref 4.0–10.5)

## 2012-03-07 LAB — COMPREHENSIVE METABOLIC PANEL
AST: 32 U/L (ref 0–37)
Albumin: 1 g/dL — ABNORMAL LOW (ref 3.5–5.2)
BUN: 71 mg/dL — ABNORMAL HIGH (ref 6–23)
Calcium: 6.5 mg/dL — ABNORMAL LOW (ref 8.4–10.5)
Creatinine, Ser: 1.43 mg/dL — ABNORMAL HIGH (ref 0.50–1.35)
GFR calc non Af Amer: 55 mL/min — ABNORMAL LOW (ref >90)

## 2012-03-07 LAB — BLOOD GAS, ARTERIAL
Acid-base deficit: 2.8 mmol/L — ABNORMAL HIGH (ref 0.0–2.0)
Bicarbonate: 21.5 mEq/L (ref 20.0–24.0)
MECHVT: 420 mL
O2 Saturation: 94.4 %
Patient temperature: 98.6
TCO2: 22.6 mmol/L (ref 0–100)
pH, Arterial: 7.385 (ref 7.350–7.450)

## 2012-03-07 LAB — BASIC METABOLIC PANEL
BUN: 86 mg/dL — ABNORMAL HIGH (ref 6–23)
CO2: 25 mEq/L (ref 19–32)
Chloride: 104 mEq/L (ref 96–112)
GFR calc Af Amer: 54 mL/min — ABNORMAL LOW (ref >90)
Glucose, Bld: 176 mg/dL — ABNORMAL HIGH (ref 70–99)
Potassium: 5.4 mEq/L — ABNORMAL HIGH (ref 3.5–5.1)

## 2012-03-07 LAB — GLUCOSE, CAPILLARY
Glucose-Capillary: 182 mg/dL — ABNORMAL HIGH (ref 70–99)
Glucose-Capillary: 216 mg/dL — ABNORMAL HIGH (ref 70–99)

## 2012-03-07 MED ORDER — STERILE WATER FOR INJECTION IV SOLN
INTRAVENOUS | Status: DC
  Administered 2012-03-07: 08:00:00 via INTRAVENOUS
  Filled 2012-03-07: qty 850

## 2012-03-07 MED ORDER — HEPARIN (PORCINE) IN NACL 100-0.45 UNIT/ML-% IJ SOLN
1300.0000 [IU]/h | INTRAMUSCULAR | Status: DC
  Administered 2012-03-07: 1300 [IU]/h via INTRAVENOUS
  Filled 2012-03-07 (×2): qty 250

## 2012-03-07 MED ORDER — INSULIN GLARGINE 100 UNIT/ML ~~LOC~~ SOLN
15.0000 [IU] | Freq: Every day | SUBCUTANEOUS | Status: DC
  Administered 2012-03-07: 15 [IU] via SUBCUTANEOUS

## 2012-03-07 MED ORDER — HEPARIN (PORCINE) IN NACL 100-0.45 UNIT/ML-% IJ SOLN
900.0000 [IU]/h | INTRAMUSCULAR | Status: DC
  Administered 2012-03-07: 1000 [IU]/h via INTRAVENOUS
  Administered 2012-03-08: 900 [IU]/h via INTRAVENOUS
  Filled 2012-03-07 (×2): qty 250

## 2012-03-07 MED ORDER — KETOROLAC TROMETHAMINE 15 MG/ML IJ SOLN
15.0000 mg | Freq: Once | INTRAMUSCULAR | Status: AC
  Administered 2012-03-07: 15 mg via INTRAVENOUS
  Filled 2012-03-07: qty 1

## 2012-03-07 MED ORDER — SODIUM POLYSTYRENE SULFONATE 15 GM/60ML PO SUSP
30.0000 g | Freq: Once | ORAL | Status: AC
  Administered 2012-03-07: 30 g via ORAL
  Filled 2012-03-07: qty 120

## 2012-03-07 MED ORDER — FAT EMULSION 20 % IV EMUL
240.0000 mL | INTRAVENOUS | Status: AC
  Administered 2012-03-07: 240 mL via INTRAVENOUS
  Filled 2012-03-07: qty 250

## 2012-03-07 MED ORDER — SODIUM CHLORIDE 0.9 % IV SOLN
2.0000 g | Freq: Once | INTRAVENOUS | Status: DC
  Filled 2012-03-07: qty 20

## 2012-03-07 MED ORDER — INSULIN REGULAR HUMAN 100 UNIT/ML IJ SOLN
INTRAMUSCULAR | Status: AC
  Administered 2012-03-07: 17:00:00 via INTRAVENOUS
  Filled 2012-03-07: qty 2000

## 2012-03-07 NOTE — Progress Notes (Signed)
Pt placed back on full support.

## 2012-03-07 NOTE — Progress Notes (Signed)
Chaplain Note:  Chaplain visited with pt's wife outside pt's room.  Pt was in bed, intubated.  Chaplain provided spiritual comfort and support for pt's wife.  Pt's wife expressed appreciation for chaplain support but had no specific needs. Pt and family are well supported by their clergy.  Chaplain will follow up as needed.  03/07/12 1300  Clinical Encounter Type  Visited With Family  Visit Type Spiritual support  Referral From Nurse  Spiritual Encounters  Spiritual Needs Emotional  Stress Factors  Patient Stress Factors Health changes  Family Stress Factors (None expressed)  Jearld Lesch, Chaplain  [phone]

## 2012-03-07 NOTE — Progress Notes (Signed)
ANTICOAGULATION CONSULT NOTE - Initial Consult  Pharmacy Consult for Heparin Indication: DVT  Allergies  Allergen Reactions  . Penicillins Other (See Comments)    Heart rate changes    Patient Measurements: Height: 5' 9"  (175.3 cm) Weight: 242 lb 15.2 oz (110.2 kg) IBW/kg (Calculated) : 70.7 Heparin Dosing Weight: 95 kg  Vital Signs: Temp: 98.7 F (37.1 C) (02/12 0335) Temp src: Oral (02/12 0335) BP: 83/61 mmHg (02/12 1000) Pulse Rate: 97 (02/12 1000)  Labs:  Recent Labs  03/05/12 0430  03/06/12 0449 03/06/12 2100 03/07/12 0453  HGB 15.1  --  13.3  --  14.7  HCT 44.3  --  40.2  --  43.1  PLT 104*  --  107*  --  114*  CREATININE 1.45*  < > 1.42* 1.38* 1.43*  < > = values in this interval not displayed.  Estimated Creatinine Clearance: 73.1 ml/min (by C-G formula based on Cr of 1.43).   Medical History: Past Medical History  Diagnosis Date  . Chronic diarrhea   . Rectal bleed   . Hemorrhoids   . Ulcerative colitis     Distal UC over 8 yrs ago diagnosed  . Diverticulitis of large intestine with perforation 10/2011    done at [place]    Medications:  Scheduled:  . antiseptic oral rinse  15 mL Mouth Rinse QID  . chlorhexidine  15 mL Mouth Rinse BID  . hydrocortisone sodium succinate  50 mg Intravenous Q6H  . insulin aspart  0-20 Units Subcutaneous Q4H  . insulin glargine  15 Units Subcutaneous Daily  . [COMPLETED] ketorolac  15 mg Intravenous Once  . metronidazole  500 mg Intravenous Q8H  . [COMPLETED] sodium chloride  500 mL Intravenous Once  . [COMPLETED] sodium chloride  500 mL Intravenous Once  . sodium chloride  10-40 mL Intracatheter Q12H  . [COMPLETED] sodium polystyrene  30 g Per Tube Once  . sodium polystyrene  30 g Oral Once  . vancomycin  500 mg Oral Q6H  . [DISCONTINUED] amiodarone  150 mg Intravenous Once  . [DISCONTINUED] calcium gluconate  2 g Intravenous Once  . [DISCONTINUED] insulin glargine  10 Units Subcutaneous Daily     Assessment: Venous duplex with superficial thrombosis in R basilic vein near PICC - PICC line out yesterday. Plan for heparin - no bolus with low target (0.3-0.5) per MD as pt with small splanchnic hematoma on CT scan. H/H stable at baseline. Plt low but trending up.   Goal of Therapy:  Heparin level 0.3-0.5 units/ml Monitor platelets by anticoagulation protocol: Yes   Plan:  - Start heparin at 1300 units/hr. No bolus. - Heparin level and CBC in 6 hours (MD requested CBC) - Daily heparin level and CBC  Sherlon Handing, PharmD, BCPS Clinical pharmacist, pager [phone] 03/07/2012,11:45 AM

## 2012-03-07 NOTE — Progress Notes (Signed)
General Surgery Note  LOS: 10 days  POD -   8 Days Post-Op Room - 2116  Assessment/Plan: 1. Total abdominal colectomy with end ileostomy, intra-abdominal drain placement x 2 by Dr. Elby Showers (02/28/12):    Flagyl/Vanc  Continue leukocytosis - WBC - 26,700 - 03/07/2012  Abd/Pelv CT 03/05/2012 showed no obvious intra-abdominal process.  Has some output in ileostomy  Will clamp NGT - if tolerated, will try tube feeds starting tomorrow.  2.  Open wound   Looks okay.  JP drains putting out large amount - but this appears to be all ascites.  We may just have to pull the drains in spite of the output. 3.  Severe malnutritionkeep on TNA  4. C. Diff colitis  5. VDRF 6. Encephalopathy - neg head CT scan 7. DVT prophylaxis - plan IV heparin (discussed with Dr. Titus Mould)  Has right basilic vein thrombosis  Subjective:  Intubated.  Does not respond much for me.  Wife and daughter are at the bedside.  I tried to answer questions about surgery with them. Objective:   Filed Vitals:   03/07/12 0800  BP: 81/54  Pulse: 99  Temp:   Resp: 23     Intake/Output from previous day:  02/11 0701 - 02/12 0700 In: 6259 [I.V.:2717; NG/GT:100; IV Piggyback:1350; TPN:2092] Out: 7000 [Urine:1835; Emesis/NG output:175; Drains:4065; Stool:925]  Intake/Output this shift:  Total I/O In: 183 [I.V.:100; TPN:83] Out: 560 [Urine:160; Drains:400]   Physical Exam:   General: Older WM, Intubated.    .   Lungs: Bilateral rhonchi   Abdomen: Distended.  Few bowel sounds.  Ostomy in right mid abdomen.   Wound: Open midline wound - looks fairly good.  Only modest wound healing so far.     Lab Results:    Recent Labs  03/06/12 0449 03/07/12 0453  WBC 25.1* 26.7*  HGB 13.3 14.7  HCT 40.2 43.1  PLT 107* 114*    BMET   Recent Labs  03/06/12 2100 03/07/12 0453  NA 136 135  K 4.7 5.1  CL 109 106  CO2 22 24  GLUCOSE 310* 231*  BUN 70* 71*  CREATININE 1.38* 1.43*  CALCIUM 5.9* 6.5*    PT/INR  No  results found for this basename: LABPROT, INR,  in the last 72 hours  ABG   Recent Labs  03/06/12 0640 03/07/12 0438  PHART 7.435 7.385  HCO3 18.9* 21.5     Studies/Results:  Ct Abdomen Pelvis Wo Contrast  03/05/2012  *RADIOLOGY REPORT*  Clinical Data: Abscess.  Assess JP drain location.  CT ABDOMEN AND PELVIS WITHOUT CONTRAST  Technique:  Multidetector CT imaging of the abdomen and pelvis was performed following the standard protocol without intravenous contrast.  Comparison: 02/26/2012  Findings: There are moderate to large bilateral pleural effusions with compressive atelectasis in the lower lobes.  Heart is normal size.  Partially open midline abdominal incision noted.  There is a right lower quadrant ostomy.  The patient is status post colectomy. Left JP drain is in place with the tip in the anterior left upper quadrant along the greater curvature of the stomach.  Fluid noted around the spleen.  Question loculated ascites adjacent to the spleen versus subcapsular fluid collection.  No evidence of bowel obstruction.  NG tube is in place within the stomach.  There appears to be gastric wall thickening, but the stomach is decompressed and difficult to evaluate.  Liver, pancreas, adrenals and kidneys have an unremarkable unenhanced appearance.  There is a JP drain  within the right side of the abdomen within the right paracolic gutter just inferior to the liver tip.  Foley catheter is present in the bladder which is decompressed.  No acute bony abnormality.  IMPRESSION: Bilateral JP drains in place.  The left JP drain courses superiorly in the left paracolic gutter with the tip passing anteriorly and lying near the greater curvature of the stomach.  The right JP drain is in the right paracolic gutter with the tip just inferior to the liver.  Fluid in the left upper quadrant adjacent to the spleen.  This could represent a subcapsular fluid collection or loculated ascites adjacent to the spleen.  This  measures very low density and is therefore not suggestive of a subcapsular hematoma.  Moderate to large bilateral pleural effusions with compressive atelectasis in the lower lobes.  Changes from prior colectomy.  Partially open midline incision noted.  No underlying fluid collection.   Original Report Authenticated By: Rolm Baptise, M.D.    Ct Head Wo Contrast  03/05/2012  *RADIOLOGY REPORT*  Clinical Data: Stroke  CT HEAD WITHOUT CONTRAST  Technique:  Contiguous axial images were obtained from the base of the skull through the vertex without contrast.  Comparison: None.  Findings: The brain does not show any evidence of acute infarction, mass lesion, hemorrhage, hydrocephalus or extra-axial collection. Brainstem appears normal.  There is an old cerebellar infarction on the right.  The cerebral hemispheres are normal except for some old white matter infarctions in the left frontoparietal region.  Bony structures are unremarkable.  Sinuses are clear.  There is a small amount of fluid in the mastoid air cells.  IMPRESSION: No acute finding.  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   Original Report Authenticated By: Nelson Chimes, M.D.    Dg Chest Port 1 View  03/06/2012  *RADIOLOGY REPORT*  Clinical Data: Evaluation of endotracheal tube.  PORTABLE CHEST - 1 VIEW  Comparison: 03/05/2002  Findings: Tip of endotracheal tube terminates 3.5 cm above the carina.  Tip of left internal jugular venous catheter terminates in the superior vena cava.  The distal portion of the enteric tube enters area of stomach.  The tip is not included on image. Tip of right PICC terminates in superior vena cava.  There is borderline cardiac silhouette enlargement accentuated by AP magnification. There is vascular congestion pattern the erect positioning. Bilateral hazy pulmonary infiltrative densities are present involving the mid and lower portions of the lungs with associated basilar  atelectasis and bilateral pleural effusions.  These opacities appear slightly increased compared to prior study.  IMPRESSION: Tip of endotracheal tube terminates 3.5 cm.  The catheter positions are described above. Slight cardiac silhouette enlargement. Vascular congestion pattern accentuated by semi-erect positioning.  Bilateral hazy pulmonary infiltrative densities are present involving the mid and lower portions of the lungs with associated basilar atelectasis and bilateral pleural effusions.  These opacities appear slightly increased compared to prior study.   Original Report Authenticated By: Shanon Brow Call      Anti-infectives:   Anti-infectives   Start     Dose/Rate Route Frequency Ordered Stop   03/05/12 2200  metroNIDAZOLE (FLAGYL) IVPB 500 mg    Comments:  First dose ASAP   500 mg 100 mL/hr over 60 Minutes Intravenous Every 8 hours 03/05/12 1346     03/02/12 1200  vancomycin (VANCOCIN) 50 mg/mL oral solution 500 mg     500 mg Oral 4 times per day 03/02/12  0913     03/01/12 1445  vancomycin (VANCOCIN) 50 mg/mL oral solution 500 mg  Status:  Discontinued     500 mg Oral 3 times per day 03/01/12 1340 03/02/12 0913   03/01/12 1400  aztreonam (AZACTAM) 1 g in dextrose 5 % 50 mL IVPB  Status:  Discontinued     1 g 100 mL/hr over 30 Minutes Intravenous 3 times per day 03/01/12 1356 03/02/12 1146   03/01/12 1300  vancomycin (VANCOCIN) 1,250 mg in sodium chloride 0.9 % 250 mL IVPB  Status:  Discontinued     1,250 mg 166.7 mL/hr over 90 Minutes Intravenous Every 12 hours 03/01/12 1127 03/02/12 1146   02/29/12 0600  ciprofloxacin (CIPRO) IVPB 400 mg     400 mg 200 mL/hr over 60 Minutes Intravenous On call to O.R. 02/28/12 1409 02/28/12 1438   02/28/12 2200  vancomycin (VANCOCIN) 500 mg in sodium chloride irrigation 0.9 % 60 mL ENEMA  Status:  Discontinued     500 mg Rectal 2 times daily 02/28/12 1504 02/28/12 1817   02/28/12 1200  vancomycin (VANCOCIN) 50 mg/mL oral solution 500 mg  Status:   Discontinued    Comments:  First dose ASAP   500 mg Per Tube 4 times per day 02/28/12 0814 02/28/12 1817   02/28/12 1000  vancomycin (VANCOCIN) IVPB 1000 mg/200 mL premix  Status:  Discontinued     1,000 mg 200 mL/hr over 60 Minutes Intravenous Every 12 hours 02/28/12 0845 03/01/12 1127   02/28/12 1000  vancomycin (VANCOCIN) 500 mg in sodium chloride irrigation 0.9 % 100 mL ENEMA  Status:  Discontinued     500 mg Rectal 2 times daily 02/28/12 0923 02/28/12 1504   02/27/12 1600  metroNIDAZOLE (FLAGYL) IVPB 500 mg  Status:  Discontinued     500 mg 100 mL/hr over 60 Minutes Intravenous Every 8 hours 02/27/12 1546 02/27/12 2000   02/27/12 1600  ciprofloxacin (CIPRO) IVPB 400 mg  Status:  Discontinued     400 mg 200 mL/hr over 60 Minutes Intravenous Every 12 hours 02/27/12 1547 02/27/12 2000   02/27/12 1545  metroNIDAZOLE (FLAGYL) IVPB 500 mg  Status:  Discontinued     500 mg 100 mL/hr over 60 Minutes Intravenous  Once 02/27/12 1535 02/28/12 0843   02/27/12 1545  vancomycin (VANCOCIN) 500 mg in sodium chloride 0.9 % 100 mL IVPB  Status:  Discontinued     500 mg 100 mL/hr over 60 Minutes Intravenous  Once 02/27/12 1535 02/27/12 1538   02/27/12 1545  ciprofloxacin (CIPRO) IVPB 400 mg  Status:  Discontinued     400 mg 200 mL/hr over 60 Minutes Intravenous  Once 02/27/12 1538 02/28/12 0843   02/27/12 1415  vancomycin (VANCOCIN) 500 mg in sodium chloride 0.9 % 100 mL IVPB     500 mg 100 mL/hr over 60 Minutes Intravenous  Once 02/27/12 1402 02/27/12 1411   02/27/12 1400  vancomycin (VANCOCIN) powder 500 mg  Status:  Discontinued     500 mg Other To Surgery 02/27/12 1359 02/27/12 1402   02/27/12 1200  metroNIDAZOLE (FLAGYL) IVPB 500 mg  Status:  Discontinued    Comments:  First dose ASAP   500 mg 100 mL/hr over 60 Minutes Intravenous Every 6 hours 02/27/12 0953 03/05/12 1346   02/27/12 1200  vancomycin (VANCOCIN) 50 mg/mL oral solution 250 mg  Status:  Discontinued    Comments:  First dose ASAP    250 mg Oral 4 times per day 02/27/12 0953 02/28/12  8978   02/26/12 1700  oseltamivir (TAMIFLU) capsule 75 mg     75 mg Oral  Once 02/26/12 1651 02/26/12 1715      Alphonsa Overall, MD, [place] Pager: [phone],   [place] [facility] Office: [phone] 03/07/2012

## 2012-03-07 NOTE — Progress Notes (Signed)
NUTRITION FOLLOW UP  Intervention:    TPN dosing per pharmacy to meet 60-70% of estimated needs while intubated (calorie goal is 1380-1610).  Nutrition Dx:   Inadequate oral intake related to inability to eat as evidenced by NPO status and s/p GI surgery. Ongoing   Goal:   TPN to provide 60-70% of estimated calorie needs (22-25 kcals/kg ideal body weight) and >/= 90% of estimated protein needs, based on ASPEN guidelines for permissive underfeeding in critically ill obese individuals. Unmet.  Monitor:   Vent status, weight trends, TPN-ability to transition to EN or PO nutrition, I/O's  Assessment:   Patient is receiving TPN for ileus s/p massive bowel resection (s/p total colectomy d/t toxic megacolon).  Remains on ventilator.  No plans to start enteral nutrition at this time.  Pt meets criteria for permissive underfeeding in the context of critically ill obese pt. Recommend TPN to meet 60-70% estimated kcal needs and >/=90% estimated protein needs. Noted: limitations with Clinimix may prevent [place] from meeting protein goals.   Height: Ht Readings from Last 1 Encounters:  02/26/12 5' 9"  (1.753 m)    Weight Status:   Wt Readings from Last 1 Encounters:  03/01/12 242 lb 15.2 oz (110.2 kg)  Body mass index is 35.86 kg/(m^2). Obesity class 2  Patient is currently intubated on ventilator support.  MV: 13.1 Temp:Temp (24hrs), Avg:98.8 F (37.1 C), Min:98.5 F (36.9 C), Max:99.4 F (37.4 C)     Re-estimated needs:  Kcal: 2300 (underfeeding goal: 1380-1610 kcal) Protein: >/= 145 gm  Fluid: >/=2 L  Skin: abdominal incisions from recent surgery   Diet Order: NPO  Patient is receiving TPN with Clinimix 5/15 @ 83 ml/hr.  Lipids (20% IVFE @ 10 ml/hr), multivitamins, and trace elements are provided 3 times weekly (MWF) due to national backorder.  Provides 1620 kcal and 100 grams protein daily (based on weekly average).  Meets 100% estimated kcal goal for underfeeding and 69% minimum  estimated protein needs.    Intake/Output Summary (Last 24 hours) at 03/07/12 1105 Last data filed at 03/07/12 1000  Gross per 24 hour  Intake 5553.2 ml  Output   6560 ml  Net -1006.8 ml    Last BM: none documented this admission    Labs:   Recent Labs Lab 03/02/12 0500  03/04/12 0504 03/05/12 0430  03/06/12 0449 03/06/12 2100 03/07/12 0453  NA 134*  < > 134* 135  < > 136 136 135  K 4.7  < > 5.9* 6.2*  < > 5.7* 4.7 5.1  CL 106  < > 104 107  < > 109 109 106  CO2 22  < > 25 23  < > 23 22 24   BUN 40*  < > 64* 72*  < > 71* 70* 71*  CREATININE 1.23  < > 1.40* 1.45*  < > 1.42* 1.38* 1.43*  CALCIUM 6.6*  < > 7.1* 6.8*  < > 6.2* 5.9* 6.5*  MG 2.6*  --  2.9* 2.8*  --   --   --   --   PHOS 2.6  --  3.2 3.3  --   --   --   --   GLUCOSE 333*  < > 234* 250*  < > 243* 310* 231*  < > = values in this interval not displayed.  CBG (last 3)   Recent Labs  03/07/12 0038 03/07/12 0335 03/07/12 0846  GLUCAP 216* 199* 212*    Scheduled Meds: . antiseptic oral rinse  15  mL Mouth Rinse QID  . chlorhexidine  15 mL Mouth Rinse BID  . hydrocortisone sodium succinate  50 mg Intravenous Q6H  . insulin aspart  0-20 Units Subcutaneous Q4H  . insulin glargine  15 Units Subcutaneous Daily  . metronidazole  500 mg Intravenous Q8H  . sodium chloride  10-40 mL Intracatheter Q12H  . vancomycin  500 mg Oral Q6H    Continuous Infusions: . sodium chloride 50 mL/hr at 03/06/12 0800  . sodium chloride 20 mL/hr at 03/06/12 1200  . fat emulsion    . fentaNYL infusion INTRAVENOUS 25 mcg/hr (03/05/12 0600)  . midazolam (VERSED) infusion Stopped (03/05/12 0600)  .  sodium bicarbonate 150 mEq in sterile water 1000 mL infusion 50 mL/hr at 03/07/12 0824  . TPN (CLINIMIX) +/- additives 83 mL/hr at 03/06/12 1751  . TPN [facility]) +/- additives      Molli Barrows, RD, LDN, CNSC Pager# [phone] After Hours Pager# [phone]

## 2012-03-07 NOTE — Progress Notes (Addendum)
ANTICOAGULATION CONSULT NOTE - Follow Up Consult    HL = 0.9 (goal 0.3 - 0.5 units/mL) Heparin weight = 95 kg   Assessment: [name] on IV heparin for +DVT in right basilic vein near PICC.  Heparin level elevated.  Confirmed with RN that lab was drawn by phlebotomy and it was a peripheral stick; heparin is infusing through the central line.  No overt bleeding reported - patient does have a small splanchnic hematoma on CT scan.    Plan: - Decrease heparin gtt to 1000 units/hr - Check 6 hr HL     Arianie Couse D. Mina Marble, PharmD, BCPS Pager:  [phone] 03/07/2012, 9:22 PM

## 2012-03-07 NOTE — Progress Notes (Signed)
PARENTERAL NUTRITION CONSULT NOTE - FOLLOW UP  Pharmacy Consult for TPN Indication: Ileus s/p massive bowel resection (s/p total colectomy d/t toxic megacolon)  Allergies  Allergen Reactions  . Penicillins Other (See Comments)    Heart rate changes    Patient Measurements: Height: 5' 9"  (175.3 cm) Weight: 242 lb 15.2 oz (110.2 kg) IBW/kg (Calculated) : 70.7 Adjusted weight: 78 Kg  Vital Signs: Temp: 98.7 F (37.1 C) (02/12 0335) Temp src: Oral (02/12 0335) BP: 84/56 mmHg (02/12 0600) Pulse Rate: 98 (02/12 0600) Intake/Output from previous day: 02/11 0701 - 02/12 0700 In: 6076 [I.V.:2617; NG/GT:100; IV Piggyback:1350; TPN:2009] Out: 7000 [Urine:1835; Emesis/NG output:175; Drains:4065; Stool:925] Intake/Output from this shift:    Labs:  Recent Labs  03/05/12 0430 03/06/12 0449 03/07/12 0453  WBC 29.9* 25.1* 26.7*  HGB 15.1 13.3 14.7  HCT 44.3 40.2 43.1  PLT 104* 107* 114*     Recent Labs  03/05/12 0430  03/06/12 0449 03/06/12 2100 03/07/12 0453  NA 135  < > 136 136 135  K 6.2*  < > 5.7* 4.7 5.1  CL 107  < > 109 109 106  CO2 23  < > 23 22 24   GLUCOSE 250*  < > 243* 310* 231*  BUN 72*  < > 71* 70* 71*  CREATININE 1.45*  < > 1.42* 1.38* 1.43*  CALCIUM 6.8*  < > 6.2* 5.9* 6.5*  MG 2.8*  --   --   --   --   PHOS 3.3  --   --   --   --   PROT 3.9*  --  3.4*  --  3.7*  ALBUMIN 1.1*  --  0.9*  --  1.0*  AST 25  --  35  --  32  ALT 13  --  15  --  13  ALKPHOS 203*  --  315*  --  300*  BILITOT 0.5  --  0.3  --  0.4  PREALBUMIN 12.9*  --   --   --   --   TRIG 154*  --   --   --   --   CHOL 74  --   --   --   --   < > = values in this interval not displayed. Estimated Creatinine Clearance: 73.1 ml/min (by C-G formula based on Cr of 1.43).    Recent Labs  03/06/12 2045 03/07/12 0038 03/07/12 0335  GLUCAP 218* 216* 199*   Insulin Requirements in the past 12 hours:  90 units insulin R in TPN + 18 units Novolog with SSI (since 1800 new bag) + 10 units  Lantus daily  Nutritional Goals:  Underfeeding goal: 4332-9518 kcal/day (60-70% of full goal of 2276 kcal/day), >/= 130g protein per day (90% of full goal of >/= 145 grams per day), >/= 2L fluid/day per RD recommendations on 03/01/12   Current Nutrition:  Clinimix 5/15 at 83 ml/hr provides 100 gm protein/day (~71% goal) and an average of 1627 kcal/day (100% underfeeding goal range)  Assessment: 52 y.o. M with history of ulcerative colitis presented to the hospital with back pain, fever and diarrhea. 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 TPN was initiated on 02/29/12 for nutrition support. Transferred to [facility] on 2/6 for continued management of care.   GI: Hx ulcerative colitis - previously in remission with mesalamine - developed toxic megacolon d/t cdiff colitis. Failed medical management, now POD#8 total colectomy  with end ileuostomy. Started on TPN 2/5.  Drains 4265 out, NG 375 out, stool 925 out last 24h.  Endo: No hx DM - CBGs above goal of <150 since last TPN started. Hyperglycemia likely related to multiple factors including TPN, critical illness/insulin resistance and steroid administration (HC 50 mg/12h).   Lytes: K=5.1, Scr continues to creep up, UOP ok. NO electrolytes in TPN. Nml, 8.9.  Renal: AKI - Scr now trending back up to 1.43, UOP 0.7 ml/kg/hr. Acid-base status ok, bicarb drip continues at 58m/hr for hyperkalemia. 1/2NS at 50 ml/hr (total IV fluid in: 4955)  Pulm: Fio2 40%  Cards: Hypotensive-MAP at goal (60), HR 100 (Afib - new onset this admit). Off amiodarone drip.   Hepatobil: AST/ALT ok. Alkphos 300, elevated. TG 101(2/6) now up to 154 (2/10), pre-albumin~3 (2/6) now up 12.9 (2/10) , alb 1.  Neuro: Sedated with VDRF. Encephalopathy. Not doing well with WUA, head CT neg 2/10.   ID: Flagyl IV  + Vanc po D#10 (2/3 >> current) for CDiff colitis now s/p  colectomy/ileostomy.Tmax 99.4, WBC down 26.7; CT= no intra-bdominal abscess noted.  Best Practices: SCDs, MC, IV PPI changed to IV Famotidine via TPN  TPN Access:  L-IJ CVC (place 03/01/12)  TPN day#: 8 (2/5 >> current)  IVF: 1/2 NS at 58mhr, 3amps biccarb in dextrose at 5051mr  Plan:  - Continue Clinimix 5/15 at 40m89m- would hold off on increasing provision until glycemia is under better control. - Continue 40mg11motidine via TPN daily. - Will await RD reassessment of nutrition needs. Given obese patient, protein provision is usually recommended per IBW and caloric provision per adjusted BW to avoid overfeeding. Also have to factor in rising SCr. - Lipids, MVI, TE on MWF only d/t national shortage - Will change bicarb fluid to SWI to help decrease dextrose load - Will increase Lantus to 15 units SQ daily starting this AM - Will f/up glycemia and labs in AM  Thanks, Desjuan Stearns K. PatelPosey ProntormD, BCPS.  Clinical Pharmacist Pager [phone]/2014 8:03 AM

## 2012-03-07 NOTE — Progress Notes (Signed)
PULMONARY  / CRITICAL CARE MEDICINE  Name: Connor Small MRN: [id number] DOB: 1960-06-23    ADMISSION DATE:  02/26/2012 CONSULTATION DATE:  2/5  REFERRING MD :  Karilyn Cota  CHIEF COMPLAINT:  Acute resp failure   BRIEF PATIENT DESCRIPTION:  69 YOM admitted to [facility] for Cdiff on 2/2. Underwent decompressive cecostomy 2/3, initially was better, then worsened on 2/4 with increased abdominal distension and tenderness, decreased renal function.  He was taken back to the OR on 2/4 for total abdominal colectomy and end ileostomy.  Post op he developed ARDS and sepsis shock and was being treated for this.  The morning of 2/6 he had new onset of A-fib and transferred to [facility].    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 2/10- CT abdo /pelvis- small hematoma likely subcapsular spleen, JPs wnl 2/11-borderline BP, pos balance, tube feeds started 2/11 clot around picc, picc dc'ed 2/12-continued borderline BP, neg balance 1 liter  LINES / TUBES: ETT - OSH 2/4>>> Foley-OSH 2/3>>> PICC OSH 2/4>>>2/11 Art Line - OSH 2/5>>>out  CULTURES: BCx2 2/4>>>negative BCx2 2/3>>>negative UC 2/3- Negative MRSA PCR 2/3- Negative C diff 2/6>>>Positive Body fluid 2/9>>>WBCs, no organisms  ANTIBIOTICS: Flagyl 2/3>>> Vanc 2/6 (IV) >>>2/7 azactam 2/6>>>2/7 Oral vanc 2/6>>>  SUBJECTIVE:  Neg  Balance, drains remain high output  VITAL SIGNS: Temp:  [98.5 F (36.9 C)-99.4 F (37.4 C)] 98.7 F (37.1 C) (02/12 0335) Pulse Rate:  [91-150] 99 (02/12 0800) Resp:  [20-30] 23 (02/12 0800) BP: (78-97)/(40-58) 81/54 mmHg (02/12 0800) SpO2:  [93 %-98 %] 95 % (02/12 0800) FiO2 (%):  [40  %] 40 % (02/12 0738) HEMODYNAMICS: CVP:  [5 mmHg-8 mmHg] 8 mmHg VENTILATOR SETTINGS: Vent Mode:  [-] PRVC FiO2 (%):  [40 %] 40 % Set Rate:  [28 bmp] 28 bmp Vt Set:  [420 mL] 420 mL PEEP:  [5 cmH20] 5 cmH20 Pressure Support:  [10 cmH20] 10 cmH20 Plateau Pressure:  [15 cmH20] 15 cmH20 INTAKE / OUTPUT: Intake/Output     02/11 0701 - 02/12 0700 02/12 0701 - 02/13 0700   I.V. (mL/kg) 2717 (24.7) 100 (0.9)   NG/GT 100    IV Piggyback 1350    TPN 2092 83   Total Intake(mL/kg) 6259 (56.8) 183 (1.7)   Urine (mL/kg/hr) 1835 (0.7) 160 (0.6)   Emesis/NG output 175 (0.1)    Drains 4065 (1.5) 400 (1.5)   Stool 925 (0.3)    Total Output 7000 560   Net -741 -377          PHYSICAL EXAMINATION: General:  Ill appearing ventilated patient Neuro: Sedated, rass -4, opening eyes spont HEENT: ETT in place Cardiovascular: irregular s1 s 2 Lungs: ronchi diffuse Abdomen: Distended, wound site clean, hypoBS , about unchanged Musculoskeletal: 2+ edema Skin: no rashes  LABS:  Recent Labs Lab 03/01/12 0455 03/01/12 0508  03/01/12 0827  03/02/12 0042 03/02/12 0500  03/04/12 0504  03/05/12 0430  03/05/12 1425  03/06/12 0449 03/06/12 0640 03/06/12 2100 03/07/12 0438 03/07/12 0453  HGB  --   --   < >  --   --  11.9* 12.6*  < > 13.7  --  15.1  --   --   --  13.3  --   --   --  14.7  WBC  --   --   < >  --   --  35.6* 35.3*  < > 31.9*  --  29.9*  --   --   --  25.1*  --   --   --  26.7*  PLT  --   --   < >  --   --  76* 74*  < > 92*  --  104*  --   --   --  107*  --   --   --  114*  NA  --   --   < >  --   --  135 134*  < > 134*  --  135  < >  --   < > 136  --  136  --  135  K  --   --   < >  --   --  4.6 4.7  < > 5.9*  --  6.2*  < >  --   < > 5.7*  --  4.7  --  5.1  CL  --   --   < >  --   --  106 106  < > 104  --  107  < >  --   < > 109  --  109  --  106  CO2  --   --   < >  --   --  22 22  < > 25  --  23  < >  --   < > 23  --  22  --  24  GLUCOSE  --   --   < >  --   --  315* 333*  < >  234*  --  250*  < >  --   < > 243*  --  310*  --  231*  BUN  --   --   < >  --   --  39* 40*  < > 64*  --  72*  < >  --   < > 71*  --  70*  --  71*  CREATININE  --   --   < >  --   --  1.21 1.23  < > 1.40*  --  1.45*  < >  --   < > 1.42*  --  1.38*  --  1.43*  CALCIUM  --   --   < >  --   --  6.4* 6.6*  < > 7.1*  --  6.8*  < >  --   < > 6.2*  --  5.9*  --  6.5*  MG  --   --   < >  --   --  2.6* 2.6*  --  2.9*  --  2.8*  --   --   --   --   --   --   --   --   PHOS  --   --   < >  --   --   --  2.6  --  3.2  --  3.3  --   --   --   --   --   --   --   --   AST  --   --   < >  --   --  28  --   --   --   --  25  --   --   --  35  --   --   --  32  ALT  --   --   < >  --   --  15  --   --   --   --  13  --   --   --  15  --   --   --  13  ALKPHOS  --   --   < >  --   --  80  --   --   --   --  203*  --   --   --  315*  --   --   --  300*  BILITOT  --   --   < >  --   --  0.7  --   --   --   --  0.5  --   --   --  0.3  --   --   --  0.4  PROT  --   --   < >  --   --  3.8*  --   --   --   --  3.9*  --   --   --  3.4*  --   --   --  3.7*  ALBUMIN  --   --   < >  --   --  1.4*  --   --   --   --  1.1*  --   --   --  0.9*  --   --   --  1.0*  APTT  --   --   --  36  --   --   --   --   --   --   --   --   --   --   --   --   --   --   --   INR  --   --   --  1.56*  --   --   --   --   --   --   --   --   --   --   --   --   --   --   --   TROPONINI  --   --   --   --   --  <0.30  --   --   --   --   --   --   --   --   --   --   --   --   --   PROCALCITON  --  20.70  --   --   --   --   --   --   --   --   --   --   --   --   --   --   --   --   --   PHART 7.366  --   --   --   < > 7.302*  --   --   --   < >  --   --  7.332*  --   --  7.435  --  7.385  --   PCO2ART 41.6  --   --   --   < > 47.5*  --   --   --   < >  --   --  38.8  --   --  27.9*  --  36.7  --  PO2ART 112.0*  --   --   --   < > 89.2  --   --   --   < >  --   --  85.0  --   --  50.0*  --  74.7*  --   < > = values in this interval not  displayed.  Recent Labs Lab 03/06/12 1706 03/06/12 2045 03/07/12 0038 03/07/12 0335 03/07/12 0846  GLUCAP 260* 218* 216* 199* 212*    Imaging: Ct Abdomen Pelvis Wo Contrast  03/05/2012  *RADIOLOGY REPORT*  Clinical Data: Abscess.  Assess JP drain location.  CT ABDOMEN AND PELVIS WITHOUT CONTRAST  Technique:  Multidetector CT imaging of the abdomen and pelvis was performed following the standard protocol without intravenous contrast.  Comparison: 02/26/2012  Findings: There are moderate to large bilateral pleural effusions with compressive atelectasis in the lower lobes.  Heart is normal size.  Partially open midline abdominal incision noted.  There is a right lower quadrant ostomy.  The patient is status post colectomy. Left JP drain is in place with the tip in the anterior left upper quadrant along the greater curvature of the stomach.  Fluid noted around the spleen.  Question loculated ascites adjacent to the spleen versus subcapsular fluid collection.  No evidence of bowel obstruction.  NG tube is in place within the stomach.  There appears to be gastric wall thickening, but the stomach is decompressed and difficult to evaluate.  Liver, pancreas, adrenals and kidneys have an unremarkable unenhanced appearance.  There is a JP drain within the right side of the abdomen within the right paracolic gutter just inferior to the liver tip.  Foley catheter is present in the bladder which is decompressed.  No acute bony abnormality.  IMPRESSION: Bilateral JP drains in place.  The left JP drain courses superiorly in the left paracolic gutter with the tip passing anteriorly and lying near the greater curvature of the stomach.  The right JP drain is in the right paracolic gutter with the tip just inferior to the liver.  Fluid in the left upper quadrant adjacent to the spleen.  This could represent a subcapsular fluid collection or loculated ascites adjacent to the spleen.  This measures very low density and is  therefore not suggestive of a subcapsular hematoma.  Moderate to large bilateral pleural effusions with compressive atelectasis in the lower lobes.  Changes from prior colectomy.  Partially open midline incision noted.  No underlying fluid collection.   Original Report Authenticated By: Charlett Nose, M.D.    Ct Head Wo Contrast  03/05/2012  *RADIOLOGY REPORT*  Clinical Data: Stroke  CT HEAD WITHOUT CONTRAST  Technique:  Contiguous axial images were obtained from the base of the skull through the vertex without contrast.  Comparison: None.  Findings: The brain does not show any evidence of acute infarction, mass lesion, hemorrhage, hydrocephalus or extra-axial collection. Brainstem appears normal.  There is an old cerebellar infarction on the right.  The cerebral hemispheres are normal except for some old white matter infarctions in the left frontoparietal region.  Bony structures are unremarkable.  Sinuses are clear.  There is a small amount of fluid in the mastoid air cells.  IMPRESSION: No acute finding.  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   Original Report Authenticated By: Paulina Fusi, M.D.    Dg Chest Port 1 View  03/07/2012  *RADIOLOGY REPORT*  Clinical Data: Evaluate endotracheal tube  PORTABLE CHEST -  1 VIEW  Comparison: 03/06/2012; 03/05/2012; 03/04/2012  Findings: Grossly unchanged cardiac silhouette and mediastinal contours.  Interval removal of right upper extremity approach PICC line.  Otherwise, stable positioning of remaining support apparatus.  Grossly unchanged layering bilateral pleural effusions, left greater than right.  Grossly unchanged bibasilar opacities. Pulmonary vasculature remains indistinct with cephalization of flow.  No definite pneumothorax.  Unchanged bones.  IMPRESSION: 1.  Interval removal of right upper extremity approach PICC line. Otherwise, stable positioning of remaining support apparatus.  No pneumothorax. 2.  Grossly  unchanged findings of pulmonary edema, small bilateral effusions and bibasilar opacities, left greater than right, atelectasis versus infiltrate.   Original Report Authenticated By: Tacey Ruiz, MD    Dg Chest Port 1 View  03/06/2012  *RADIOLOGY REPORT*  Clinical Data: Evaluation of endotracheal tube.  PORTABLE CHEST - 1 VIEW  Comparison: 03/05/2002  Findings: Tip of endotracheal tube terminates 3.5 cm above the carina.  Tip of left internal jugular venous catheter terminates in the superior vena cava.  The distal portion of the enteric tube enters area of stomach.  The tip is not included on image. Tip of right PICC terminates in superior vena cava.  There is borderline cardiac silhouette enlargement accentuated by AP magnification. There is vascular congestion pattern the erect positioning. Bilateral hazy pulmonary infiltrative densities are present involving the mid and lower portions of the lungs with associated basilar atelectasis and bilateral pleural effusions.  These opacities appear slightly increased compared to prior study.  IMPRESSION: Tip of endotracheal tube terminates 3.5 cm.  The catheter positions are described above. Slight cardiac silhouette enlargement. Vascular congestion pattern accentuated by semi-erect positioning.  Bilateral hazy pulmonary infiltrative densities are present involving the mid and lower portions of the lungs with associated basilar atelectasis and bilateral pleural effusions.  These opacities appear slightly increased compared to prior study.   Original Report Authenticated By: [name] Call      ASSESSMENT / PLAN:  PULMONARY A: Acute respiratory failure in setting of septic shock and volume overload. Lt pleural effusion. P:   Weaning attempt, cpap 5 , ps 5 as goal to 4 hrs, neurostatus precludes extubation Allow neg balance with output, see renal Reduce rate 24 Repeat treatment K pcxr noted  CARDIOVASCULAR A: Septic Shock - Secondary to C-diff colitis; off  pressors 2/08. Atrial Fibrillation with RVR. Low [place] score. High output from JP's Clot around picc, dvt rue 2/11 P: .  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 Hx of ulcerative colitis. Followed by Dr. Charna Elizabeth as outpt. Nutrition. HIGH output from JP's P:   Post-op care per [facility] Continue TNA  pepcid  HEMATOLOGIC A: Leukocytosis. Thrombocytopenia. dvt picc aassociated rue 2/11 P:  F/u CBC in am and in pm  Improved plat trend slowly, likely reflects resolution Cdiff scd Heparin for fib and dvt today, no bolus, low there level with concern small hematoma old splenic on CT  INFECTIOUS A: C diff colitis P:  Continue enteral vancomycin and IV flagyl  ENDOCRINE A: Hyperglycemia. uncontrolled Relative adrenal insufficiency. P:   SSI Lantus to 15 Maintain  solucortef to full stress dose with borderline BP Insulin in tpn to remain  NEUROLOGIC A:  Acute encephalopathy 2nd to sepsis. Poor response in WUA, but improved daily CT neg acute, likely residual sedation P:   Continue to hold all sedation CT head portable reviewed More alert, hold MRi  Updated family at bedside and during raounds  CC time 30 minutes.  Mcarthur Rossetti. Tyson Alias, MD, FACP Pgr: [phone] [facility]

## 2012-03-08 ENCOUNTER — Inpatient Hospital Stay (HOSPITAL_COMMUNITY): Payer: BC Managed Care – PPO

## 2012-03-08 LAB — CBC
HCT: 42.9 % (ref 39.0–52.0)
RDW: 17.3 % — ABNORMAL HIGH (ref 11.5–15.5)
WBC: 35.8 10*3/uL — ABNORMAL HIGH (ref 4.0–10.5)

## 2012-03-08 LAB — BASIC METABOLIC PANEL
CO2: 24 mEq/L (ref 19–32)
Calcium: 6.3 mg/dL — CL (ref 8.4–10.5)
Creatinine, Ser: 1.67 mg/dL — ABNORMAL HIGH (ref 0.50–1.35)
GFR calc non Af Amer: 45 mL/min — ABNORMAL LOW (ref >90)
Glucose, Bld: 235 mg/dL — ABNORMAL HIGH (ref 70–99)
Sodium: 136 mEq/L (ref 135–145)

## 2012-03-08 LAB — MAGNESIUM: Magnesium: 2.4 mg/dL (ref 1.5–2.5)

## 2012-03-08 LAB — COMPREHENSIVE METABOLIC PANEL
AST: 49 U/L — ABNORMAL HIGH (ref 0–37)
Albumin: 1.1 g/dL — ABNORMAL LOW (ref 3.5–5.2)
Alkaline Phosphatase: 340 U/L — ABNORMAL HIGH (ref 39–117)
BUN: 84 mg/dL — ABNORMAL HIGH (ref 6–23)
Chloride: 101 mEq/L (ref 96–112)
Potassium: 5 mEq/L (ref 3.5–5.1)
Total Bilirubin: 0.5 mg/dL (ref 0.3–1.2)

## 2012-03-08 LAB — POCT I-STAT 3, ART BLOOD GAS (G3+)
Acid-base deficit: 2 mmol/L (ref 0.0–2.0)
O2 Saturation: 96 %
TCO2: 22 mmol/L (ref 0–100)
pCO2 arterial: 33.9 mmHg — ABNORMAL LOW (ref 35.0–45.0)
pO2, Arterial: 80 mmHg (ref 80.0–100.0)

## 2012-03-08 LAB — GLUCOSE, CAPILLARY
Glucose-Capillary: 171 mg/dL — ABNORMAL HIGH (ref 70–99)
Glucose-Capillary: 204 mg/dL — ABNORMAL HIGH (ref 70–99)

## 2012-03-08 LAB — HEPARIN LEVEL (UNFRACTIONATED): Heparin Unfractionated: 0.61 IU/mL (ref 0.30–0.70)

## 2012-03-08 MED ORDER — FAMOTIDINE IN NACL 20-0.9 MG/50ML-% IV SOLN: 20.0000 mg | INTRAVENOUS | Status: DC

## 2012-03-08 MED ORDER — CALCIUM GLUCONATE 10 % IV SOLN
1.0000 g | Freq: Once | INTRAVENOUS | Status: AC
  Administered 2012-03-08: 1 g via INTRAVENOUS
  Filled 2012-03-08: qty 10

## 2012-03-08 MED ORDER — VITAL AF 1.2 CAL PO LIQD
1000.0000 mL | ORAL | Status: DC
  Administered 2012-03-08: 1000 mL
  Filled 2012-03-08 (×5): qty 1000

## 2012-03-08 MED ORDER — INSULIN GLARGINE 100 UNIT/ML ~~LOC~~ SOLN
20.0000 [IU] | Freq: Every day | SUBCUTANEOUS | Status: DC
  Administered 2012-03-08: 20 [IU] via SUBCUTANEOUS

## 2012-03-08 MED ORDER — INSULIN REGULAR HUMAN 100 UNIT/ML IJ SOLN
INTRAVENOUS | Status: AC
  Administered 2012-03-08: 18:00:00 via INTRAVENOUS
  Filled 2012-03-08: qty 2000

## 2012-03-08 MED ORDER — SODIUM CHLORIDE 0.9 % IV BOLUS (SEPSIS)
1000.0000 mL | Freq: Once | INTRAVENOUS | Status: AC
  Administered 2012-03-08: 1000 mL via INTRAVENOUS

## 2012-03-08 MED ORDER — PHENYLEPHRINE HCL 10 MG/ML IJ SOLN
30.0000 ug/min | INTRAVENOUS | Status: DC
  Administered 2012-03-08: 45 ug/min via INTRAVENOUS
  Administered 2012-03-08: 150 ug/min via INTRAVENOUS
  Administered 2012-03-08 (×2): 100 ug/min via INTRAVENOUS
  Administered 2012-03-08: 150 ug/min via INTRAVENOUS
  Administered 2012-03-08: 30 ug/min via INTRAVENOUS
  Administered 2012-03-09: 150 ug/min via INTRAVENOUS
  Filled 2012-03-08 (×7): qty 1

## 2012-03-08 MED ORDER — FUROSEMIDE 10 MG/ML IJ SOLN
40.0000 mg | Freq: Two times a day (BID) | INTRAMUSCULAR | Status: DC
  Administered 2012-03-08 – 2012-03-09 (×3): 40 mg via INTRAVENOUS
  Filled 2012-03-08 (×4): qty 4

## 2012-03-08 MED ORDER — SODIUM POLYSTYRENE SULFONATE 15 GM/60ML PO SUSP
45.0000 g | Freq: Once | ORAL | Status: AC
  Administered 2012-03-08: 45 g
  Filled 2012-03-08: qty 180

## 2012-03-08 MED ORDER — HEPARIN (PORCINE) IN NACL 100-0.45 UNIT/ML-% IJ SOLN
700.0000 [IU]/h | INTRAMUSCULAR | Status: DC
  Administered 2012-03-08: 700 [IU]/h via INTRAVENOUS
  Filled 2012-03-08 (×2): qty 250

## 2012-03-08 NOTE — Progress Notes (Signed)
PARENTERAL NUTRITION CONSULT NOTE - FOLLOW UP  Pharmacy Consult for TPN Indication: ileus s/p massive bowel resection (s/p total colectomy d/t toxic megacolon)  Allergies  Allergen Reactions  . Penicillins Other (See Comments)    Heart rate changes    Patient Measurements: Height: 5' 9"  (175.3 cm) Weight: 242 lb 15.2 oz (110.2 kg) IBW/kg (Calculated) : 70.7 Adjusted weight: 78 Kg  Vital Signs: BP: 103/57 mmHg (02/13 0545) Pulse Rate: 94 (02/13 0545) Intake/Output from previous day: 02/12 0701 - 02/13 0700 In: 4316.8 [I.V.:1415.8; IV Piggyback:300; TPN:1826] Out: 9518 [Urine:1415; Drains:3095; Stool:600] Intake/Output from this shift:    Labs:  Recent Labs  03/06/12 0449 03/07/12 0453 03/08/12 0405  WBC 25.1* 26.7* 35.8*  HGB 13.3 14.7 14.6  HCT 40.2 43.1 42.9  PLT 107* 114* 174     Recent Labs  03/06/12 0449  03/07/12 0453 03/07/12 1733 03/08/12 0405  NA 136  < > 135 135 135  K 5.7*  < > 5.1 5.4* 5.0  CL 109  < > 106 104 101  CO2 23  < > 24 25 25   GLUCOSE 243*  < > 231* 176* 171*  BUN 71*  < > 71* 86* 84*  CREATININE 1.42*  < > 1.43* 1.62* 1.52*  CALCIUM 6.2*  < > 6.5* 6.5* 6.6*  MG  --   --   --   --  2.4  PHOS  --   --   --   --  3.8  PROT 3.4*  --  3.7*  --  3.9*  ALBUMIN 0.9*  --  1.0*  --  1.1*  AST 35  --  32  --  49*  ALT 15  --  13  --  17  ALKPHOS 315*  --  300*  --  340*  BILITOT 0.3  --  0.4  --  0.5  < > = values in this interval not displayed. Estimated Creatinine Clearance: 68.8 ml/min (by C-G formula based on Cr of 1.52).    Recent Labs  03/07/12 1945 03/08/12 0009 03/08/12 0346  GLUCAP 178* 171* 179*   Insulin Requirements in the past 12 hours:  90 units insulin R in TPN + 12 units Novolog with SSI (since 1800 new bag) + 15 units Lantus daily  Nutritional Goals:  Underfeeding goal: 8416-6063 kcal/day (60-70% of full goal of 2276 kcal/day), >/= 130g protein per day (90% of full goal of >/= 145 grams per day), >/= 2L  fluid/day per RD recommendations on 03/01/12   Current Nutrition:  Clinimix 5/15 at 83 ml/hr provides 100 gm protein/day (~71% goal) and an average of 1627 kcal/day (100% underfeeding goal range)  Assessment: 52 y.o. M with history of ulcerative colitis presented to the hospital with back pain, fever and diarrhea. 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 TPN was initiated on 02/29/12 for nutrition support. Transferred to [facility] on 2/6 for continued management of care.   GI: Hx ulcerative colitis  - developed toxic megacolon d/t cdiff colitis. Failed medical management, now POD#9 total colectomy with end ileuostomy. Started on TPN 2/5.  Drains 3095 out, stool 600 out last 24h. Appreciate RD re-assessment of needs on 2/12.  Endo: No hx DM - CBGs improved with increasing Lantus, but still slightly above goal of <150. Hyperglycemia likely related to multiple factors including TPN, critical illness/insulin resistance and steroid administration (HC 50 mg/12h).   Lytes: K=5.0, Scr decr, now  1.52, BUN 84, down slightly as well. UOP ok. NO electrolytes in TPN. Corrected Ca is nml, 8.9.  Renal: AKI - Scr now trending back up to 1.43, UOP 0.7 ml/kg/hr. Acid-base status ok, bicarb drip continues at 48m/hr for hyperkalemia. 1/2NS at 50 ml/hr (total IV fluid in: 4955)  Pulm: Fio2 40%  Cards: Hypotensive-MAP at goal (60), HR 100 (Afib - new onset this admit). Off amiodarone drip.   Hepatobil: AST/ALT ok. Alkphos 340, elevated. TG 154 (2/10), pre-albumin~3 (2/6) now up 12.9 (2/10) , alb 1.  Neuro: Sedated with VDRF. Encephalopathy. Not doing well with WUA, head CT neg 2/10.   ID: Flagyl IV  + Vanc po D#10 (2/3 >> current) for CDiff colitis now s/p colectomy/ileostomy.Tmax 99, WBC down 35.8; CT= no intra-bdominal abscess noted.  Best Practices: SCDs, MC, IV PPI changed to IV Famotidine via TPN  TPN Access:   L-IJ CVC (place 03/01/12)  TPN day#: 9 (2/5 >> current)  IVF: 1/2 NS at 255mhr  Plan:  - Continue Clinimix 5/15 at 8368mr- would hold off on increasing (protein) provision until glycemia is under better control. - Continue 48m54mmotidine via TPN daily. - Lipids, MVI, TE on MWF only d/t national shortage - Will increase Lantus to 20 units SQ daily starting this AM - Will f/up glycemia and labs in AM  Thanks, Sakeena Teall K. PatePosey ProntoarmD, BCPS.  Clinical Pharmacist Pager [phone] [id number] 7:48 AM

## 2012-03-08 NOTE — Progress Notes (Signed)
9 Days Post-Op  Subjective: Pt intubated, but somewhat alert.  Wife and daughter at bedside and note significant improvement in his condition.  Pt denies N/V since NG was clamped.  Pt notes some abdominal pain, but well controlled.    Objective: Vital signs in last 24 hours: Temp:  [96 F (35.6 C)-99.2 F (37.3 C)] 99 F (37.2 C) (02/13 0845) Pulse Rate:  [87-97] 94 (02/13 0545) Resp:  [16-34] 19 (02/13 0545) BP: (77-108)/(39-63) 103/57 mmHg (02/13 0545) SpO2:  [93 %-98 %] 97 % (02/13 0545) FiO2 (%):  [40 %] 40 % (02/13 0530) Last BM Date: 03/06/12  Intake/Output from previous day: 02/12 0701 - 02/13 0700 In: 4316.8 [I.V.:1415.8; IV Piggyback:300; TPN:1826] Out: 5110 [Urine:1415; Drains:3095; Stool:600] Intake/Output this shift:    PE: Gen:  Alert, NAD, pleasant Abd: Soft, ND, mildly ttp over surgical sites, +BS, no, midline wound still draining, but otherwise C/D/I, 2 JP drains full with serosanguinous drainage, ostomy with some patchy erythema and darker areas, patent with greenish output   Lab Results:   Recent Labs  03/07/12 0453 03/08/12 0405  WBC 26.7* 35.8*  HGB 14.7 14.6  HCT 43.1 42.9  PLT 114* 174   BMET  Recent Labs  03/07/12 1733 03/08/12 0405  NA 135 135  K 5.4* 5.0  CL 104 101  CO2 25 25  GLUCOSE 176* 171*  BUN 86* 84*  CREATININE 1.62* 1.52*  CALCIUM 6.5* 6.6*   PT/INR No results found for this basename: LABPROT, INR,  in the last 72 hours CMP     Component Value Date/Time   NA 135 03/08/2012 0405   K 5.0 03/08/2012 0405   CL 101 03/08/2012 0405   CO2 25 03/08/2012 0405   GLUCOSE 171* 03/08/2012 0405   BUN 84* 03/08/2012 0405   CREATININE 1.52* 03/08/2012 0405   CALCIUM 6.6* 03/08/2012 0405   PROT 3.9* 03/08/2012 0405   ALBUMIN 1.1* 03/08/2012 0405   AST 49* 03/08/2012 0405   ALT 17 03/08/2012 0405   ALKPHOS 340* 03/08/2012 0405   BILITOT 0.5 03/08/2012 0405   GFRNONAA 51* 03/08/2012 0405   GFRAA 59* 03/08/2012 0405   Lipase     Component  Value Date/Time   LIPASE 9* 02/26/2012 1409       Studies/Results: Dg Chest Port 1 View  03/08/2012  *RADIOLOGY REPORT*  Clinical Data: Shortness of breath  PORTABLE CHEST - 1 VIEW  Comparison: 03/07/2012  Findings: Endotracheal tube terminates 4 cm above the carina.  Stable left IJ venous catheter.  Enteric tube courses below the diaphragm.  Possible mild interstitial edema, improved.  Moderate layering bilateral pleural effusions, unchanged.  No pneumothorax.  The heart is top normal in size.  IMPRESSION: Endotracheal tube terminates 4 cm above the carina.  Possible mild interstitial edema, improved.  Moderate layering bilateral pleural effusions.   Original Report Authenticated By: Julian Hy, M.D.    Dg Chest Port 1 View  03/07/2012  *RADIOLOGY REPORT*  Clinical Data: Evaluate endotracheal tube  PORTABLE CHEST - 1 VIEW  Comparison: 03/06/2012; 03/05/2012; 03/04/2012  Findings: Grossly unchanged cardiac silhouette and mediastinal contours.  Interval removal of right upper extremity approach PICC line.  Otherwise, stable positioning of remaining support apparatus.  Grossly unchanged layering bilateral pleural effusions, left greater than right.  Grossly unchanged bibasilar opacities. Pulmonary vasculature remains indistinct with cephalization of flow.  No definite pneumothorax.  Unchanged bones.  IMPRESSION: 1.  Interval removal of right upper extremity approach PICC line. Otherwise, stable positioning of  remaining support apparatus.  No pneumothorax. 2.  Grossly unchanged findings of pulmonary edema, small bilateral effusions and bibasilar opacities, left greater than right, atelectasis versus infiltrate.   Original Report Authenticated By: Jake Seats, MD     Anti-infectives: Anti-infectives   Start     Dose/Rate Route Frequency Ordered Stop   03/05/12 2200  metroNIDAZOLE (FLAGYL) IVPB 500 mg    Comments:  First dose ASAP   500 mg 100 mL/hr over 60 Minutes Intravenous Every 8 hours  03/05/12 1346     03/02/12 1200  vancomycin (VANCOCIN) 50 mg/mL oral solution 500 mg     500 mg Oral 4 times per day 03/02/12 0913     03/01/12 1445  vancomycin (VANCOCIN) 50 mg/mL oral solution 500 mg  Status:  Discontinued     500 mg Oral 3 times per day 03/01/12 1340 03/02/12 0913   03/01/12 1400  aztreonam (AZACTAM) 1 g in dextrose 5 % 50 mL IVPB  Status:  Discontinued     1 g 100 mL/hr over 30 Minutes Intravenous 3 times per day 03/01/12 1356 03/02/12 1146   03/01/12 1300  vancomycin (VANCOCIN) 1,250 mg in sodium chloride 0.9 % 250 mL IVPB  Status:  Discontinued     1,250 mg 166.7 mL/hr over 90 Minutes Intravenous Every 12 hours 03/01/12 1127 03/02/12 1146   02/29/12 0600  ciprofloxacin (CIPRO) IVPB 400 mg     400 mg 200 mL/hr over 60 Minutes Intravenous On call to O.R. 02/28/12 1409 02/28/12 1438   02/28/12 2200  vancomycin (VANCOCIN) 500 mg in sodium chloride irrigation 0.9 % 60 mL ENEMA  Status:  Discontinued     500 mg Rectal 2 times daily 02/28/12 1504 02/28/12 1817   02/28/12 1200  vancomycin (VANCOCIN) 50 mg/mL oral solution 500 mg  Status:  Discontinued    Comments:  First dose ASAP   500 mg Per Tube 4 times per day 02/28/12 0814 02/28/12 1817   02/28/12 1000  vancomycin (VANCOCIN) IVPB 1000 mg/200 mL premix  Status:  Discontinued     1,000 mg 200 mL/hr over 60 Minutes Intravenous Every 12 hours 02/28/12 0845 03/01/12 1127   02/28/12 1000  vancomycin (VANCOCIN) 500 mg in sodium chloride irrigation 0.9 % 100 mL ENEMA  Status:  Discontinued     500 mg Rectal 2 times daily 02/28/12 0923 02/28/12 1504   02/27/12 1600  metroNIDAZOLE (FLAGYL) IVPB 500 mg  Status:  Discontinued     500 mg 100 mL/hr over 60 Minutes Intravenous Every 8 hours 02/27/12 1546 02/27/12 2000   02/27/12 1600  ciprofloxacin (CIPRO) IVPB 400 mg  Status:  Discontinued     400 mg 200 mL/hr over 60 Minutes Intravenous Every 12 hours 02/27/12 1547 02/27/12 2000   02/27/12 1545  metroNIDAZOLE (FLAGYL) IVPB 500 mg   Status:  Discontinued     500 mg 100 mL/hr over 60 Minutes Intravenous  Once 02/27/12 1535 02/28/12 0843   02/27/12 1545  vancomycin (VANCOCIN) 500 mg in sodium chloride 0.9 % 100 mL IVPB  Status:  Discontinued     500 mg 100 mL/hr over 60 Minutes Intravenous  Once 02/27/12 1535 02/27/12 1538   02/27/12 1545  ciprofloxacin (CIPRO) IVPB 400 mg  Status:  Discontinued     400 mg 200 mL/hr over 60 Minutes Intravenous  Once 02/27/12 1538 02/28/12 0843   02/27/12 1415  vancomycin (VANCOCIN) 500 mg in sodium chloride 0.9 % 100 mL IVPB     500 mg  100 mL/hr over 60 Minutes Intravenous  Once 02/27/12 1402 02/27/12 1411   02/27/12 1400  vancomycin (VANCOCIN) powder 500 mg  Status:  Discontinued     500 mg Other To Surgery 02/27/12 1359 02/27/12 1402   02/27/12 1200  metroNIDAZOLE (FLAGYL) IVPB 500 mg  Status:  Discontinued    Comments:  First dose ASAP   500 mg 100 mL/hr over 60 Minutes Intravenous Every 6 hours 02/27/12 0953 03/05/12 1346   02/27/12 1200  vancomycin (VANCOCIN) 50 mg/mL oral solution 250 mg  Status:  Discontinued    Comments:  First dose ASAP   250 mg Oral 4 times per day 02/27/12 0953 02/28/12 0814   02/26/12 1700  oseltamivir (TAMIFLU) capsule 75 mg     75 mg Oral  Once 02/26/12 1651 02/26/12 1715       Assessment/Plan 1. Total abdominal colectomy with end ileostomy, intra-abdominal drain placement x 2 by Dr. Elby Showers (02/28/12):  Flagyl/Vanc, Continue leukocytosis - WBC - 26,700 - 03/07/2012, Abd/Pelv CT 03/05/2012 showed no obvious intra-abdominal process. Has some output in ileostomy, will start trickle (10-58m/hour) TF today if tolerated.  If he tolerates it then will advance tomorrow if not re-clamp. 2. Open wound - Looks okay.  JP drains putting out large amount - but this appears to be all ascites. Will let Dr. NLucia Gaskinsdecide if he wants the drain pulled 3. Severe malnutrition - keep on TNA , will start TF 4. C. Diff colitis  5. VDRF  6. Encephalopathy - neg head CT scan   7. DVT prophylaxis - plan IV heparin, right basilic vein thrombosis    LOS: 11 days    DORT, Izora Benn 03/08/2012, 9:00 AM Pager: [phone]

## 2012-03-08 NOTE — Progress Notes (Signed)
[facility] for Heparin Indication: DVT  Allergies  Allergen Reactions  . Penicillins Other (See Comments)    Heart rate changes    Patient Measurements: Height: 5' 9"  (175.3 cm) Weight: 242 lb 15.2 oz (110.2 kg) IBW/kg (Calculated) : 70.7 Heparin Dosing Weight: 95 kg  Vital Signs: BP: 103/57 mmHg (02/13 0545) Pulse Rate: 94 (02/13 0545)  Labs:  Recent Labs  03/06/12 0449  03/07/12 0453 03/07/12 1733 03/07/12 2000 03/08/12 0350 03/08/12 0405  HGB 13.3  --  14.7  --   --   --  14.6  HCT 40.2  --  43.1  --   --   --  42.9  PLT 107*  --  114*  --   --   --  174  HEPARINUNFRC  --   --   --   --  0.90* 0.61  --   CREATININE 1.42*  < > 1.43* 1.62*  --   --  1.52*  < > = values in this interval not displayed.  Estimated Creatinine Clearance: 68.8 ml/min (by C-G formula based on Cr of 1.52).  Assessment: 52 yo male with DVT for heparin  Goal of Therapy:  Heparin level 0.3-0.5 units/ml Monitor platelets by anticoagulation protocol: Yes   Plan:  Decrease heparin 900 units/hr Recheck in 6 hrs  Phillis Knack, PharmD, BCPS 03/08/2012,5:56 AM

## 2012-03-08 NOTE — Progress Notes (Addendum)
NUTRITION CONSULT/FOLLOW UP  Intervention:    Initiate Vital AF 1.2 formula at 20 ml/hr  After rounds 2/14 ---> advance to goal rate of 35 ml/hr with Prostat liquid protein 30 ml 6 times daily to provide 1608 kcals (70% of estimated kcal needs), 153 gm protein (100% of estimated protein needs), 681 ml of water  Recommend liquid MVI daily RD to follow for nutrition care plan  Nutrition Dx:   Inadequate oral intake related to inability to eat as evidenced by NPO status and s/p GI surgery, ongoing   Goal:   TPN to provide 60-70% of estimated calorie needs (22-25 kcals/kg ideal body weight) and >/= 90% of estimated protein needs, based on ASPEN guidelines for permissive underfeeding in critically ill obese individuals, unmet  Monitor:   TPN prescription, EN regimen, respiratory status, weight, labs, I/O's  Assessment:   RD consult for EN initiation & management.  Plan to initiate trickle feeds at 20 ml/hr via NGT today and increase as tolerated tomorrow after rounds.  Patient is currently intubated on ventilator support MV: 13.1 Temp: 37.2  Patient is receiving TPN with Clinimix E 5/15 @ 83 ml/hr.  Lipids (20% IVFE @ 10 ml/hr), multivitamins, and trace elements are provided 3 times weekly (MWF) due to national backorder.  Provides 1620 kcal and 100 grams protein daily (based on weekly average).  Meets 100% minimum estimated kcal goal for underfeeding and 69% minimum estimated protein needs.  Noted limitations with Clinimix may prevent [place] from meeting protein goals.   Height: Ht Readings from Last 1 Encounters:  02/26/12 5' 9"  (1.753 m)    Weight Status:   Wt Readings from Last 1 Encounters:  03/01/12 242 lb 15.2 oz (110.2 kg)    Re-estimated needs:  Kcal: 2300 (underfeeding goal: 1380-1610 kcal) Protein: >/= 145 gm Fluid: >/= 2.0 L  Skin: abdominal surgical incisions   Diet Order: NPO   Intake/Output Summary (Last 24 hours) at 03/08/12 1255 Last data filed at  03/08/12 1021  Gross per 24 hour  Intake 4197.23 ml  Output   5275 ml  Net -1077.77 ml    Labs:   Recent Labs Lab 03/04/12 0504 03/05/12 0430  03/07/12 0453 03/07/12 1733 03/08/12 0405  NA 134* 135  < > 135 135 135  K 5.9* 6.2*  < > 5.1 5.4* 5.0  CL 104 107  < > 106 104 101  CO2 25 23  < > 24 25 25   BUN 64* 72*  < > 71* 86* 84*  CREATININE 1.40* 1.45*  < > 1.43* 1.62* 1.52*  CALCIUM 7.1* 6.8*  < > 6.5* 6.5* 6.6*  MG 2.9* 2.8*  --   --   --  2.4  PHOS 3.2 3.3  --   --   --  3.8  GLUCOSE 234* 250*  < > 231* 176* 171*  < > = values in this interval not displayed.  CBG (last 3)   Recent Labs  03/07/12 1945 03/08/12 0009 03/08/12 0346  GLUCAP 178* 171* 179*    Scheduled Meds: . antiseptic oral rinse  15 mL Mouth Rinse QID  . chlorhexidine  15 mL Mouth Rinse BID  . furosemide  40 mg Intravenous Q12H  . hydrocortisone sodium succinate  50 mg Intravenous Q6H  . insulin aspart  0-20 Units Subcutaneous Q4H  . insulin glargine  20 Units Subcutaneous Daily  . metronidazole  500 mg Intravenous Q8H  . sodium chloride  10-40 mL Intracatheter Q12H  . vancomycin  500 mg Oral Q6H    Continuous Infusions: . sodium chloride 20 mL/hr at 03/08/12 0848  . sodium chloride 20 mL/hr at 03/06/12 1200  . fat emulsion 480 kcal (03/08/12 0500)  . fentaNYL infusion INTRAVENOUS 25 mcg/hr (03/05/12 0600)  . heparin 900 Units/hr (03/08/12 0629)  . midazolam (VERSED) infusion Stopped (03/05/12 0600)  . TPN (CLINIMIX) +/- additives 83 mL/hr at 03/07/12 1723  . TPN [facility]) +/- additives      Arthur Holms, RD, LDN Pager #: [phone] After-Hours Pager #: [phone]

## 2012-03-08 NOTE — Progress Notes (Signed)
General surgery attending note:  Patient interviewed and examined this morning. Care plan discussed with Dr. Titus Mould. Carefully discussed with wife.  Abdomen is reasonably soft. Excellent ileostomy output. Ileostomy still purplish but has good turgor and may survive. Wound packed open. Fascia intact. JP drainage continues to be watery and serosanguineous, not purulent  Plan: Begin trickle tube feeds at 20 cc per hour today. If he seems to be doing well we will consider advancing this tomorrow. Nutrition consult requested to facilitate this  Follow wound care and ileostomy daily   Isra Lindy M. Dalbert Batman, M.D., [facility] General and Minimally invasive Surgery Breast and Colorectal Surgery Office:   [phone] Pager:   [phone]

## 2012-03-08 NOTE — Progress Notes (Signed)
eLink Physician-Brief Progress Note Patient Name: Connor Small DOB: Mar 26, 1960 MRN: [id number]  Date of Service  03/08/2012   HPI/Events of Note   Air hunger while on PRVC.  Not on sedation.  eICU Interventions   Placed PS 12 PEEP 5 with improving of airway mechanics.   Intervention Category Major Interventions: Respiratory failure - evaluation and management  [name] 03/08/2012, 9:43 PM

## 2012-03-08 NOTE — Progress Notes (Addendum)
CRITICAL VALUE ALERT  Critical value received:  Calcium: 6.3  Date of notification:  03/08/12  Time of notification:  1700  Critical value read back:yes  Nurse who received alert:  Bonna Gains E  MD notified (1st page):  Dr. Kieth Brightly    Time of first page:  28  MD notified (2nd page):  Time of second page:  Responding MD:  Dr. Kieth Brightly   Time MD responded:  1710

## 2012-03-08 NOTE — Progress Notes (Addendum)
ANTICOAGULATION CONSULT NOTE - Follow Up Consult  Pharmacy Consult for Heparin Indication: DVT  Allergies  Allergen Reactions  . Penicillins Other (See Comments)    Heart rate changes    Patient Measurements: Height: 5' 9"  (175.3 cm) Weight: 242 lb 15.2 oz (110.2 kg) IBW/kg (Calculated) : 70.7 Heparin Dosing Weight: 95kg  Vital Signs: Temp: 97.1 F (36.2 C) (02/13 1259) Temp src: Oral (02/13 1259) BP: 90/54 mmHg (02/13 1030) Pulse Rate: 105 (02/13 1030)  Labs:  Recent Labs  03/06/12 0449  03/07/12 0453 03/07/12 1733 03/07/12 2000 03/08/12 0350 03/08/12 0405  HGB 13.3  --  14.7  --   --   --  14.6  HCT 40.2  --  43.1  --   --   --  42.9  PLT 107*  --  114*  --   --   --  174  HEPARINUNFRC  --   --   --   --  0.90* 0.61  --   CREATININE 1.42*  < > 1.43* 1.62*  --   --  1.52*  < > = values in this interval not displayed.  Estimated Creatinine Clearance: 68.8 ml/min (by C-G formula based on Cr of 1.52).   Medications:  Heparin @ 900 units/hr  Assessment: 51yom continues on heparin for positive DVT in right basilic vein near PICC (0/60/04). Heparin level is supratherapeutic and actually increased despite rate decrease this morning. Heparin level drawn correctly. Patient does have a small splanchnic hematoma on CT scan but otherwise no overt bleeding noted. CBC stable, platelets increasing.  Goal of Therapy:  Heparin level 0.3-0.5 units/ml Monitor platelets by anticoagulation protocol: Yes   Plan:  1) Decrease heparin further to 700 units/hr 2) Check another 6 hour heparin level  Deboraha Sprang 03/08/2012,3:36 PM

## 2012-03-08 NOTE — Progress Notes (Signed)
PULMONARY  / CRITICAL CARE MEDICINE  Name: Connor Small MRN: [id number] DOB: 09-Apr-1960    ADMISSION DATE:  02/26/2012 CONSULTATION DATE:  2/5  REFERRING MD :  Karilyn Cota  CHIEF COMPLAINT:  Acute resp failure   BRIEF PATIENT DESCRIPTION:  89 YOM admitted to [facility] for Cdiff on 2/2. Underwent decompressive cecostomy 2/3, initially was better, then worsened on 2/4 with increased abdominal distension and tenderness, decreased renal function.  He was taken back to the OR on 2/4 for total abdominal colectomy and end ileostomy.  Post op he developed ARDS and sepsis shock and was being treated for this.  The morning of 2/6 he had new onset of A-fib and transferred to [facility].    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 2/10- CT abdo /pelvis- small hematoma likely subcapsular spleen, JPs wnl 2/11-borderline BP, pos balance, tube feeds started 2/11 clot around picc, picc dc'ed 2/12-continued borderline BP, neg balance 1 liter  LINES / TUBES: ETT - OSH 2/4>>> Foley-OSH 2/3>>> PICC OSH 2/4>>>2/11 Art Line - OSH 2/5>>>out  CULTURES: BCx2 2/4>>>negative BCx2 2/3>>>negative UC 2/3- Negative MRSA PCR 2/3- Negative C diff 2/6>>>Positive Body fluid 2/9>>>WBCs, no organisms  ANTIBIOTICS: Flagyl 2/3>>> Vanc 2/6 (IV) >>>2/7 azactam 2/6>>>2/7 Oral vanc 2/6>>>  SUBJECTIVE:  Neg  Balance slight, improving neurostatus  VITAL SIGNS: Temp:  [96 F (35.6 C)-99.2 F (37.3 C)] 99.2 F (37.3 C) (02/12 1528) Pulse Rate:  [87-99] 94 (02/13 0545) Resp:  [16-34] 19 (02/13 0545) BP: (77-108)/(39-63) 103/57 mmHg (02/13 0545) SpO2:  [93 %-98 %] 97 % (02/13 0545) FiO2 (%):   [40 %] 40 % (02/13 0530) HEMODYNAMICS: CVP:  [8 mmHg-9 mmHg] 9 mmHg VENTILATOR SETTINGS: Vent Mode:  [-] PRVC FiO2 (%):  [40 %] 40 % Set Rate:  [28 bmp] 28 bmp Vt Set:  [420 mL] 420 mL PEEP:  [5 cmH20] 5 cmH20 Pressure Support:  [5 cmH20-10 cmH20] 10 cmH20 Plateau Pressure:  [13 cmH20-18 cmH20] 18 cmH20 INTAKE / OUTPUT: Intake/Output     02/12 0701 - 02/13 0700 02/13 0701 - 02/14 0700   I.V. (mL/kg) 1415.8 (12.8)    Other 775    NG/GT     IV Piggyback 300    TPN 1826    Total Intake(mL/kg) 4316.8 (39.2)    Urine (mL/kg/hr) 1415 (0.5)    Emesis/NG output     Drains 3095 (1.2)    Stool 600 (0.2)    Total Output 5110     Net -793.3            PHYSICAL EXAMINATION: General:  Ill appearing ventilated patient, more awake, follows commands Neuro: rass -2, follows commands, moves upper ext, tracks well HEENT: ETT in place Cardiovascular: irregular s1 s 2 Lungs: ronchi diffuse, reduced bases Abdomen: Distended, wound site clean, hypoBS Musculoskeletal: 3+ edema Skin: no rashes  LABS:  Recent Labs Lab 03/01/12 0827  03/02/12 0042  03/04/12 0504  03/05/12 0430  03/05/12 1425  03/06/12 0449 03/06/12 0640  03/07/12 0438 03/07/12 0453 03/07/12 1733 03/08/12 0405  HGB  --   --  11.9*  < > 13.7  --  15.1  --   --   --  13.3  --   --   --  14.7  --  14.6  WBC  --   --  35.6*  < > 31.9*  --  29.9*  --   --   --  25.1*  --   --   --  26.7*  --  35.8*  PLT  --   --  76*  < > 92*  --  104*  --   --   --  107*  --   --   --  114*  --  174  NA  --   --  135  < > 134*  --  135  < >  --   < > 136  --   < >  --  135 135 135  K  --   --  4.6  < > 5.9*  --  6.2*  < >  --   < > 5.7*  --   < >  --  5.1 5.4* 5.0  CL  --   --  106  < > 104  --  107  < >  --   < > 109  --   < >  --  106 104 101  CO2  --   --  22  < > 25  --  23  < >  --   < > 23  --   < >  --  24 25 25   GLUCOSE  --   --  315*  < > 234*  --  250*  < >  --   < > 243*  --   < >  --  231* 176* 171*  BUN  --   --  39*  < >  64*  --  72*  < >  --   < > 71*  --   < >  --  71* 86* 84*  CREATININE  --   --  1.21  < > 1.40*  --  1.45*  < >  --   < > 1.42*  --   < >  --  1.43* 1.62* 1.52*  CALCIUM  --   --  6.4*  < > 7.1*  --  6.8*  < >  --   < > 6.2*  --   < >  --  6.5* 6.5* 6.6*  MG  --   --  2.6*  < > 2.9*  --  2.8*  --   --   --   --   --   --   --   --   --  2.4  PHOS  --   --   --   < > 3.2  --  3.3  --   --   --   --   --   --   --   --   --  3.8  AST  --   < > 28  --   --   --  25  --   --   --  35  --   --   --  32  --  49*  ALT  --   < > 15  --   --   --  13  --   --   --  15  --   --   --  13  --  17  ALKPHOS  --   < > 80  --   --   --  203*  --   --   --  315*  --   --   --  300*  --  340*  BILITOT  --   < > 0.7  --   --   --  0.5  --   --   --  0.3  --   --   --  0.4  --  0.5  PROT  --   < > 3.8*  --   --   --  3.9*  --   --   --  3.4*  --   --   --  3.7*  --  3.9*  ALBUMIN  --   < > 1.4*  --   --   --  1.1*  --   --   --  0.9*  --   --   --  1.0*  --  1.1*  APTT 36  --   --   --   --   --   --   --   --   --   --   --   --   --   --   --   --   INR 1.56*  --   --   --   --   --   --   --   --   --   --   --   --   --   --   --   --   TROPONINI  --   --  <0.30  --   --   --   --   --   --   --   --   --   --   --   --   --   --   PHART  --   < > 7.302*  --   --   < >  --   --  7.332*  --   --  7.435  --  7.385  --   --   --   PCO2ART  --   < > 47.5*  --   --   < >  --   --  38.8  --   --  27.9*  --  36.7  --   --   --   PO2ART  --   < > 89.2  --   --   < >  --   --  85.0  --   --  50.0*  --  74.7*  --   --   --   < > = values in this interval not displayed.  Recent Labs Lab 03/07/12 1217 03/07/12 1528 03/07/12 1945 03/08/12 0009 03/08/12 0346  GLUCAP 192* 182* 178* 171* 179*    Imaging: Dg Chest Port 1 View  03/07/2012  *RADIOLOGY REPORT*  Clinical Data: Evaluate endotracheal tube  PORTABLE CHEST - 1 VIEW  Comparison: 03/06/2012; 03/05/2012; 03/04/2012  Findings: Grossly unchanged cardiac  silhouette and mediastinal contours.  Interval removal of right upper extremity approach PICC line.  Otherwise, stable positioning of remaining support apparatus.  Grossly unchanged layering bilateral pleural effusions, left greater than right.  Grossly unchanged bibasilar opacities. Pulmonary vasculature remains indistinct with cephalization of flow.  No definite pneumothorax.  Unchanged bones.  IMPRESSION: 1.  Interval removal of right upper extremity approach PICC line. Otherwise, stable positioning of remaining support apparatus.  No pneumothorax. 2.  Grossly unchanged findings of pulmonary edema, small bilateral effusions and bibasilar opacities,  left greater than right, atelectasis versus infiltrate.   Original Report Authenticated By: Tacey Ruiz, MD      ASSESSMENT / PLAN:  PULMONARY A: Acute respiratory failure in setting of septic shock and volume overload. Lt pleural effusion. P:   Wean further cpap 5 ps 5 Need further neg balance, add lasix, may need to [place] chest for potential thora, try to avoid ABG on current settings to ensure not acidotic with mild K up Goal to reduce MV further if able pcxr noted, may need to [place] bases  CARDIOVASCULAR A: Septic Shock - Secondary to C-diff colitis; off pressors 2/08. Atrial Fibrillation with RVR. Low [place] score. High output from JP's Clot around picc, dvt rue 2/11 P: .  Allow neg balance Accuracy of cuff for sure a concern , he mentation and renal FXn  / output wnl with current MAP, goal MAP 55 Attempt cuff on other ext Lasix amio to remain off Heparin drip tolerated  RENAL A:  Acute Renal Failure, elevated output from JP;s Hypervolemia Hyperkalemia >> 2nd to TPN formula P:   bmet q12h Repeat kyxlate 45 gm Remain 1/2 NS at 50 cc/hr- kvo Add lasix  GASTROINTESTINAL A: Toxic megacolon 2nd to C difficile colitis s/p colectomy with end ileostomy 2/5. Hx of ulcerative colitis. Followed by Dr. Charna Elizabeth as  outpt. Nutrition. HIGH output from JP's P:   Post-op care per [facility] Continue TNA, reduce if TF started pepcid  HEMATOLOGIC A: Leukocytosis. Thrombocytopenia. Improved Hemoconcentration 2/13 dvt picc aassociated rue 2/11 P:  F/u CBC in am and in pm  Improved plat trend scd Heparin for fib and dvt tolerated well, cbc noted NOT to drop  INFECTIOUS A: C diff colitis P:   Continue enteral vancomycin and IV flagyl Wbc up frmo neg balance  ENDOCRINE A: Hyperglycemia. uncontrolled Relative adrenal insufficiency. P:   SSI Lantus to 15, remain, we are in NICE range Maintain  solucortef to full stress dose with borderline BP Insulin in tpn to remain, no increase  NEUROLOGIC A:  Acute encephalopathy IMproved daily P:   Continue to hold all sedation More alert, daily Chair position daily, successful OT/PT in 1-2 days when he can progress with them  Updated family at bedside and during raounds  CC time 30 minutes.  Mcarthur Rossetti. Tyson Alias, MD, FACP Pgr: [phone] [facility]

## 2012-03-09 ENCOUNTER — Inpatient Hospital Stay (HOSPITAL_COMMUNITY): Payer: BC Managed Care – PPO

## 2012-03-09 DIAGNOSIS — R609 Edema, unspecified: Secondary | ICD-10-CM

## 2012-03-09 LAB — URINALYSIS, ROUTINE W REFLEX MICROSCOPIC
Bilirubin Urine: NEGATIVE
Glucose, UA: NEGATIVE mg/dL
Ketones, ur: NEGATIVE mg/dL
Leukocytes, UA: NEGATIVE
Protein, ur: NEGATIVE mg/dL
pH: 6 (ref 5.0–8.0)

## 2012-03-09 LAB — GLUCOSE, CAPILLARY
Glucose-Capillary: 182 mg/dL — ABNORMAL HIGH (ref 70–99)
Glucose-Capillary: 204 mg/dL — ABNORMAL HIGH (ref 70–99)

## 2012-03-09 LAB — BASIC METABOLIC PANEL
BUN: 91 mg/dL — ABNORMAL HIGH (ref 6–23)
BUN: 97 mg/dL — ABNORMAL HIGH (ref 6–23)
CO2: 21 mEq/L (ref 19–32)
Chloride: 102 mEq/L (ref 96–112)
Creatinine, Ser: 1.81 mg/dL — ABNORMAL HIGH (ref 0.50–1.35)
GFR calc Af Amer: 51 mL/min — ABNORMAL LOW (ref >90)
GFR calc non Af Amer: 44 mL/min — ABNORMAL LOW (ref >90)
Glucose, Bld: 250 mg/dL — ABNORMAL HIGH (ref 70–99)
Potassium: 4.3 mEq/L (ref 3.5–5.1)
Sodium: 131 mEq/L — ABNORMAL LOW (ref 135–145)

## 2012-03-09 LAB — BLOOD GAS, ARTERIAL
Drawn by: 24513
PEEP: 5 cmH2O
Pressure support: 12 cmH2O
pCO2 arterial: 36.4 mmHg (ref 35.0–45.0)
pO2, Arterial: 73.6 mmHg — ABNORMAL LOW (ref 80.0–100.0)

## 2012-03-09 LAB — BODY FLUID CULTURE

## 2012-03-09 LAB — CBC WITH DIFFERENTIAL/PLATELET
Basophils Absolute: 0.4 10*3/uL — ABNORMAL HIGH (ref 0.0–0.1)
Basophils Relative: 1 % (ref 0–1)
Eosinophils Absolute: 0 10*3/uL (ref 0.0–0.7)
Hemoglobin: 14.1 g/dL (ref 13.0–17.0)
Lymphocytes Relative: 3 % — ABNORMAL LOW (ref 12–46)
MCHC: 33.3 g/dL (ref 30.0–36.0)
Neutrophils Relative %: 88 % — ABNORMAL HIGH (ref 43–77)

## 2012-03-09 LAB — PHOSPHORUS: Phosphorus: 4.1 mg/dL (ref 2.3–4.6)

## 2012-03-09 LAB — URINE MICROSCOPIC-ADD ON

## 2012-03-09 LAB — HEPARIN LEVEL (UNFRACTIONATED)
Heparin Unfractionated: 0.45 IU/mL (ref 0.30–0.70)
Heparin Unfractionated: 0.29 IU/mL — ABNORMAL LOW (ref 0.30–0.70)

## 2012-03-09 MED ORDER — FAMOTIDINE IN NACL 20-0.9 MG/50ML-% IV SOLN
20.0000 mg | Freq: Two times a day (BID) | INTRAVENOUS | Status: DC
  Administered 2012-03-09 – 2012-03-11 (×4): 20 mg via INTRAVENOUS
  Filled 2012-03-09 (×5): qty 50

## 2012-03-09 MED ORDER — FUROSEMIDE 10 MG/ML IJ SOLN
8.0000 mg/h | INTRAVENOUS | Status: DC
  Administered 2012-03-09: 8 mg/h via INTRAVENOUS
  Filled 2012-03-09 (×2): qty 25

## 2012-03-09 MED ORDER — FAT EMULSION 20 % IV EMUL
250.0000 mL | INTRAVENOUS | Status: AC
  Administered 2012-03-09: 250 mL via INTRAVENOUS
  Filled 2012-03-09: qty 250

## 2012-03-09 MED ORDER — PHENYLEPHRINE HCL 10 MG/ML IJ SOLN
30.0000 ug/min | INTRAVENOUS | Status: DC
  Administered 2012-03-09: 150 ug/min via INTRAVENOUS
  Administered 2012-03-09: 50 ug/min via INTRAVENOUS
  Administered 2012-03-10: 150 ug/min via INTRAVENOUS
  Administered 2012-03-10: 56.3 ug/min via INTRAVENOUS
  Administered 2012-03-10: 150 ug/min via INTRAVENOUS
  Administered 2012-03-10: 180 ug/min via INTRAVENOUS
  Administered 2012-03-11 (×2): 200 ug/min via INTRAVENOUS
  Filled 2012-03-09 (×13): qty 4

## 2012-03-09 MED ORDER — ADULT MULTIVITAMIN LIQUID CH
5.0000 mL | Freq: Every day | ORAL | Status: DC
  Administered 2012-03-09 – 2012-03-16 (×7): 5 mL
  Filled 2012-03-09 (×8): qty 5

## 2012-03-09 MED ORDER — VITAL AF 1.2 CAL PO LIQD
1000.0000 mL | ORAL | Status: DC
  Administered 2012-03-09 – 2012-03-13 (×6): 1000 mL
  Filled 2012-03-09 (×8): qty 1000

## 2012-03-09 MED ORDER — TRACE MINERALS CR-CU-F-FE-I-MN-MO-SE-ZN IV SOLN
INTRAVENOUS | Status: AC
  Administered 2012-03-09: 18:00:00 via INTRAVENOUS
  Filled 2012-03-09: qty 1000

## 2012-03-09 MED ORDER — PRO-STAT SUGAR FREE PO LIQD
60.0000 mL | Freq: Three times a day (TID) | ORAL | Status: DC
  Administered 2012-03-09 – 2012-03-13 (×13): 60 mL
  Filled 2012-03-09 (×17): qty 60

## 2012-03-09 MED ORDER — INSULIN GLARGINE 100 UNIT/ML ~~LOC~~ SOLN
25.0000 [IU] | Freq: Every day | SUBCUTANEOUS | Status: DC
  Administered 2012-03-09 – 2012-03-10 (×2): 25 [IU] via SUBCUTANEOUS

## 2012-03-09 MED ORDER — HEPARIN (PORCINE) IN NACL 100-0.45 UNIT/ML-% IJ SOLN
1100.0000 [IU]/h | INTRAMUSCULAR | Status: DC
  Administered 2012-03-09: 800 [IU]/h via INTRAVENOUS
  Filled 2012-03-09 (×3): qty 250

## 2012-03-09 NOTE — Progress Notes (Signed)
ANTICOAGULATION CONSULT NOTE - Follow Up Consult  Pharmacy Consult for Heparin Indication: DVT  Allergies  Allergen Reactions  . Penicillins Other (See Comments)    Heart rate changes    Patient Measurements: Height: 5' 9"  (175.3 cm) Weight: 226 lb 3.1 oz (102.6 kg) IBW/kg (Calculated) : 70.7 Heparin Dosing Weight: 95kg  Vital Signs: Temp: 98.8 F (37.1 C) (02/14 0400) Temp src: Oral (02/14 0400) BP: 80/50 mmHg (02/14 0800) Pulse Rate: 93 (02/14 0800)  Labs:  Recent Labs  03/07/12 0453  03/08/12 0405 03/08/12 1500 03/08/12 1615 03/08/12 2200 03/09/12 0530 03/09/12 0630  HGB 14.7  --  14.6  --   --   --  14.1  --   HCT 43.1  --  42.9  --   --   --  42.4  --   PLT 114*  --  174  --   --   --  284  --   HEPARINUNFRC  --   < >  --  0.70  --  0.45  --  0.29*  CREATININE 1.43*  < > 1.52*  --  1.67*  --  1.70*  --   < > = values in this interval not displayed.  Estimated Creatinine Clearance: 59.4 ml/min (by C-G formula based on Cr of 1.7).  Medications:  Heparin @ 700 units/hr  Assessment: 51yom continues on heparin for positive DVT in right basilic vein near PICC (5/39/67). Heparin level is slightly below goal this morning. No issues with infusion. Patient does have a small splanchnic hematoma on CT scan but otherwise no overt bleeding noted. CBC stable, platelets increasing.   Goal of Therapy:  Heparin level 0.3-0.5 units/ml Monitor platelets by anticoagulation protocol: Yes   Plan:  1) Increase heparin to 800 units/hr 2) Follow up heparin level, CBC in AM  Deboraha Sprang 03/09/2012,8:43 AM

## 2012-03-09 NOTE — Progress Notes (Signed)
NUTRITION CONSULT/FOLLOW UP  Intervention:   Increase Vital AF 1.2 by 10 ml every 4 hours to goal rate of 35 ml/h with Prostat 60 ml TID to provide 1608 kcals, 153 gm protein, 681 ml free water daily. Add MVI via tube daily to help meet 100% of DRI's.  Nutrition Dx:   Inadequate oral intake related to inability to eat as evidenced by NPO status and s/p GI surgery. Ongoing.  Goal:   Intake to meet 60-70% of estimated calorie needs (22-25 kcals/kg ideal body weight) and >/= 90% of estimated protein needs, based on ASPEN guidelines for permissive underfeeding in critically ill obese individuals, met.  Monitor:   TF advancement and tolerance, respiratory status, weight trend, labs.  Assessment:   RD consulted for TF advancement.  Patient has been tolerating elemental tube feeding formula (Vital AF 1.2) at 20 ml/h since yesterday, increased to 30 ml/h this morning.  RN reports that patient has had ~10 ml residual this morning.  No problems tolerating feedings.  Patient remains intubated on ventilator support MV: 15.9 Tmax:  37.3   Patient is receiving TPN with Clinimix E 5/15 @ 83 ml/hr. Decreasing rate to 40 ml/h today. Lipids (20% IVFE @ 10 ml/hr), multivitamins, and trace elements are provided 3 times weekly (MWF) due to national backorder.    TPN is being weaned as TF is advanced.   Height: Ht Readings from Last 1 Encounters:  02/26/12 5' 9"  (1.753 m)    Weight Status:   Wt Readings from Last 1 Encounters:  03/09/12 226 lb 3.1 oz (102.6 kg)    Re-estimated needs:  Kcal: 2300 (underfeeding goal: 1380-1610 kcal) Protein: >/= 145 gm Fluid: >/= 2.0 L  Skin: abdominal surgical incisions   Diet Order: NPO   Intake/Output Summary (Last 24 hours) at 03/09/12 1221 Last data filed at 03/09/12 0800  Gross per 24 hour  Intake 5084.67 ml  Output   5400 ml  Net -315.33 ml    Labs:   Recent Labs Lab 03/05/12 0430  03/08/12 0405 03/08/12 1615 03/09/12 0530  NA 135  <  > 135 136 131*  K 6.2*  < > 5.0 4.6 4.3  CL 107  < > 101 104 101  CO2 23  < > 25 24 23   BUN 72*  < > 84* 91* 91*  CREATININE 1.45*  < > 1.52* 1.67* 1.70*  CALCIUM 6.8*  < > 6.6* 6.3* 6.7*  MG 2.8*  --  2.4  --  2.3  PHOS 3.3  --  3.8  --  4.1  GLUCOSE 250*  < > 171* 235* 234*  < > = values in this interval not displayed.  CBG (last 3)   Recent Labs  03/08/12 2356 03/09/12 0417 03/09/12 0748  GLUCAP 191* 182* 194*    Scheduled Meds: . antiseptic oral rinse  15 mL Mouth Rinse QID  . chlorhexidine  15 mL Mouth Rinse BID  . famotidine (PEPCID) IV  20 mg Intravenous Q12H  . feeding supplement (VITAL AF 1.2 CAL)  1,000 mL Per Tube Q24H  . hydrocortisone sodium succinate  50 mg Intravenous Q6H  . insulin aspart  0-20 Units Subcutaneous Q4H  . insulin glargine  25 Units Subcutaneous Daily  . metronidazole  500 mg Intravenous Q8H  . sodium chloride  10-40 mL Intracatheter Q12H  . vancomycin  500 mg Oral Q6H    Continuous Infusions: . sodium chloride 20 mL/hr at 03/08/12 0848  . sodium chloride 20 mL/hr at  03/06/12 1200  . TPN (CLINIMIX) +/- additives     And  . fat emulsion    . furosemide (LASIX) infusion 8 mg/hr (03/09/12 1053)  . heparin 800 Units/hr (03/09/12 0915)  . phenylephrine (NEO-SYNEPHRINE) Adult infusion 60 mcg/min (03/09/12 0907)  . TPN [facility]) +/- additives 83 mL/hr at 03/08/12 [place], RD, LDN, [place] Pager# [phone] After Hours Pager# [phone]

## 2012-03-09 NOTE — Progress Notes (Signed)
[facility] Progress Note 10 Days Post-Op  Subjective: Patient is alert on the ventilator.  Looks tired.  Possibly to be extubated today.  Objective: Vital signs in last 24 hours: Temp:  [97 F (36.1 C)-99.2 F (37.3 C)] 97 F (36.1 C) (02/14 0850) Pulse Rate:  [85-109] 93 (02/14 0853) Resp:  [15-28] 22 (02/14 0853) BP: (68-107)/(39-81) 79/39 mmHg (02/14 0853) SpO2:  [85 %-98 %] 96 % (02/14 0853) FiO2 (%):  [40 %-50 %] 40 % (02/14 0853) Weight:  [102.6 kg (226 lb 3.1 oz)] 102.6 kg (226 lb 3.1 oz) (02/14 0400) Last BM Date: 03/09/12  Intake/Output from previous day: 02/13 0701 - 02/14 0700 In: 5678.7 [I.V.:2219.7; NG/GT:140; IV Piggyback:1410; XBD:5329] Out: 6785 [Urine:1840; Drains:4495; Stool:450] Intake/Output this shift: Total I/O In: 20 [I.V.:20] Out: -   General: No acute distress.  Weaning on CP/PS on vent  Lungs: Clear  Abd: Midline wound looks good.  Lots of serous drainage from [facility] drains.    Extremities: No changes  Neuro: Intact.  Lab Results:  @LABLAST2 (wbc:2,hgb:2,hct:2,plt:2) BMET  Recent Labs  03/08/12 1615 03/09/12 0530  NA 136 131*  K 4.6 4.3  CL 104 101  CO2 24 23  GLUCOSE 235* 234*  BUN 91* 91*  CREATININE 1.67* 1.70*  CALCIUM 6.3* 6.7*   PT/INR No results found for this basename: LABPROT, INR,  in the last 72 hours ABG  Recent Labs  03/08/12 0828 03/09/12 0016  PHART 7.411 7.354  HCO3 21.4 19.7*    Studies/Results: Dg Chest Port 1 View  03/09/2012  *RADIOLOGY REPORT*  Clinical Data: Evaluate endotracheal tube and pleural effusions  PORTABLE CHEST - 1 VIEW  Comparison: 03/08/2012; 03/07/2012; 03/05/2012  Findings:  Grossly unchanged cardiac silhouette and mediastinal contours. Stable positioning of support apparatus.  Grossly unchanged small layering bilateral pleural effusions and bilateral mid and lower lung heterogeneous opacities, left greater than right.  No new focal airspace opacity. No definite evidence of edema.  No  pneumothorax.  Unchanged bones.  IMPRESSION: 1.  Stable positioning of support apparatus.  No pneumothorax. 2.  Grossly unchanged findings of bilateral pleural effusions and bilateral mid and lower lung opacities, left greater than right, atelectasis versus infiltrate. No definite evidence of edema.   Original Report Authenticated By: Jake Seats, MD    Dg Chest Port 1 View  03/08/2012  *RADIOLOGY REPORT*  Clinical Data: Shortness of breath  PORTABLE CHEST - 1 VIEW  Comparison: 03/07/2012  Findings: Endotracheal tube terminates 4 cm above the carina.  Stable left IJ venous catheter.  Enteric tube courses below the diaphragm.  Possible mild interstitial edema, improved.  Moderate layering bilateral pleural effusions, unchanged.  No pneumothorax.  The heart is top normal in size.  IMPRESSION: Endotracheal tube terminates 4 cm above the carina.  Possible mild interstitial edema, improved.  Moderate layering bilateral pleural effusions.   Original Report Authenticated By: Julian Hy, M.D.     Anti-infectives: Anti-infectives   Start     Dose/Rate Route Frequency Ordered Stop   03/05/12 2200  metroNIDAZOLE (FLAGYL) IVPB 500 mg    Comments:  First dose ASAP   500 mg 100 mL/hr over 60 Minutes Intravenous Every 8 hours 03/05/12 1346     03/02/12 1200  vancomycin (VANCOCIN) 50 mg/mL oral solution 500 mg     500 mg Oral 4 times per day 03/02/12 0913     03/01/12 1445  vancomycin (VANCOCIN) 50 mg/mL oral solution 500 mg  Status:  Discontinued     500  mg Oral 3 times per day 03/01/12 1340 03/02/12 0913   03/01/12 1400  aztreonam (AZACTAM) 1 g in dextrose 5 % 50 mL IVPB  Status:  Discontinued     1 g 100 mL/hr over 30 Minutes Intravenous 3 times per day 03/01/12 1356 03/02/12 1146   03/01/12 1300  vancomycin (VANCOCIN) 1,250 mg in sodium chloride 0.9 % 250 mL IVPB  Status:  Discontinued     1,250 mg 166.7 mL/hr over 90 Minutes Intravenous Every 12 hours 03/01/12 1127 03/02/12 1146   02/29/12 0600   ciprofloxacin (CIPRO) IVPB 400 mg     400 mg 200 mL/hr over 60 Minutes Intravenous On call to O.R. 02/28/12 1409 02/28/12 1438   02/28/12 2200  vancomycin (VANCOCIN) 500 mg in sodium chloride irrigation 0.9 % 60 mL ENEMA  Status:  Discontinued     500 mg Rectal 2 times daily 02/28/12 1504 02/28/12 1817   02/28/12 1200  vancomycin (VANCOCIN) 50 mg/mL oral solution 500 mg  Status:  Discontinued    Comments:  First dose ASAP   500 mg Per Tube 4 times per day 02/28/12 0814 02/28/12 1817   02/28/12 1000  vancomycin (VANCOCIN) IVPB 1000 mg/200 mL premix  Status:  Discontinued     1,000 mg 200 mL/hr over 60 Minutes Intravenous Every 12 hours 02/28/12 0845 03/01/12 1127   02/28/12 1000  vancomycin (VANCOCIN) 500 mg in sodium chloride irrigation 0.9 % 100 mL ENEMA  Status:  Discontinued     500 mg Rectal 2 times daily 02/28/12 0923 02/28/12 1504   02/27/12 1600  metroNIDAZOLE (FLAGYL) IVPB 500 mg  Status:  Discontinued     500 mg 100 mL/hr over 60 Minutes Intravenous Every 8 hours 02/27/12 1546 02/27/12 2000   02/27/12 1600  ciprofloxacin (CIPRO) IVPB 400 mg  Status:  Discontinued     400 mg 200 mL/hr over 60 Minutes Intravenous Every 12 hours 02/27/12 1547 02/27/12 2000   02/27/12 1545  metroNIDAZOLE (FLAGYL) IVPB 500 mg  Status:  Discontinued     500 mg 100 mL/hr over 60 Minutes Intravenous  Once 02/27/12 1535 02/28/12 0843   02/27/12 1545  vancomycin (VANCOCIN) 500 mg in sodium chloride 0.9 % 100 mL IVPB  Status:  Discontinued     500 mg 100 mL/hr over 60 Minutes Intravenous  Once 02/27/12 1535 02/27/12 1538   02/27/12 1545  ciprofloxacin (CIPRO) IVPB 400 mg  Status:  Discontinued     400 mg 200 mL/hr over 60 Minutes Intravenous  Once 02/27/12 1538 02/28/12 0843   02/27/12 1415  vancomycin (VANCOCIN) 500 mg in sodium chloride 0.9 % 100 mL IVPB     500 mg 100 mL/hr over 60 Minutes Intravenous  Once 02/27/12 1402 02/27/12 1411   02/27/12 1400  vancomycin (VANCOCIN) powder 500 mg  Status:   Discontinued     500 mg Other To Surgery 02/27/12 1359 02/27/12 1402   02/27/12 1200  metroNIDAZOLE (FLAGYL) IVPB 500 mg  Status:  Discontinued    Comments:  First dose ASAP   500 mg 100 mL/hr over 60 Minutes Intravenous Every 6 hours 02/27/12 0953 03/05/12 1346   02/27/12 1200  vancomycin (VANCOCIN) 50 mg/mL oral solution 250 mg  Status:  Discontinued    Comments:  First dose ASAP   250 mg Oral 4 times per day 02/27/12 0953 02/28/12 0814   02/26/12 1700  oseltamivir (TAMIFLU) capsule 75 mg     75 mg Oral  Once 02/26/12 1651 02/26/12 1715  Assessment/Plan: s/p Procedure(s): TOTAL COLECTOMY ILEOSTOMY Advance diet Increased tube feedings. Would still stay away from rectal stump enemas. Possible extubation today.  LOS: 12 days   Kathryne Eriksson. Dahlia Bailiff, MD, FACS [phone] [facility] Surgery 03/09/2012

## 2012-03-09 NOTE — Progress Notes (Signed)
PULMONARY  / CRITICAL CARE MEDICINE  Name: KEELIN ALBRACHT MRN: [id number] DOB: 04-Mar-1960    ADMISSION DATE:  02/26/2012 CONSULTATION DATE:  2/5  REFERRING MD :  Karilyn Cota  CHIEF COMPLAINT:  Acute resp failure   BRIEF PATIENT DESCRIPTION:  40 YOM admitted to [facility] for Cdiff on 2/2. Underwent decompressive cecostomy 2/3, initially was better, then worsened on 2/4 with increased abdominal distension and tenderness, decreased renal function.  He was taken back to the OR on 2/4 for total abdominal colectomy and end ileostomy.  Post op he developed ARDS and sepsis shock and was being treated for this.  The morning of 2/6 he had new onset of A-fib and transferred to [facility].    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 2/10- CT abdo /pelvis- small hematoma likely subcapsular spleen, JPs wnl 2/11-borderline BP, pos balance, tube feeds started 2/11 clot around picc, picc dc'ed 2/12-continued borderline BP, neg balance 1 liter 2/14- neo required, 1.2 liters neg, air hunger, changed to PS 12  LINES / TUBES: ETT - OSH 2/4>>> Foley-OSH 2/3>>> PICC OSH 2/4>>>2/11 Art Line - OSH 2/5>>>out  CULTURES: BCx2 2/4>>>negative BCx2 2/3>>>negative UC 2/3- Negative MRSA PCR 2/3- Negative C diff 2/6>>>Positive Body fluid 2/9>>>WBCs, no organisms  ANTIBIOTICS: Flagyl 2/3>>> Vanc 2/6 (IV) >>>2/7 azactam 2/6>>>2/7 Oral vanc 2/6>>>  SUBJECTIVE:  Neg  Balance slight,tolerated TF  VITAL SIGNS: Temp:  [97 F (36.1 C)-99.2 F (37.3 C)] 97 F (36.1 C) (02/14 0850) Pulse Rate:  [85-109] 93 (02/14 0853) Resp:  [15-28] 22 (02/14 0853) BP: (68-107)/(39-81) 79/39 mmHg (02/14  0853) SpO2:  [85 %-98 %] 96 % (02/14 0853) FiO2 (%):  [40 %-50 %] 40 % (02/14 0853) Weight:  [102.6 kg (226 lb 3.1 oz)] 102.6 kg (226 lb 3.1 oz) (02/14 0400) HEMODYNAMICS: CVP:  [6 mmHg-8 mmHg] 8 mmHg VENTILATOR SETTINGS: Vent Mode:  [-] PSV;CPAP FiO2 (%):  [40 %-50 %] 40 % Set Rate:  [20 bmp] 20 bmp Vt Set:  [420 mL] 420 mL PEEP:  [5 cmH20] 5 cmH20 Pressure Support:  [8 cmH20-12 cmH20] 12 cmH20 Plateau Pressure:  [13 cmH20] 13 cmH20 INTAKE / OUTPUT: Intake/Output     02/13 0701 - 02/14 0700 02/14 0701 - 02/15 0700   I.V. (mL/kg) 2219.7 (21.6) 20 (0.2)   Other     NG/GT 140    IV Piggyback 1410    TPN 1909    Total Intake(mL/kg) 5678.7 (55.3) 20 (0.2)   Urine (mL/kg/hr) 1840 (0.7)    Drains 4495 (1.8)    Stool 450 (0.2)    Total Output 6785     Net -1106.3 +20          PHYSICAL EXAMINATION: General:awake, follows commands Neuro: rass -1, nonfocal HEENT: ETT in place Cardiovascular: irregular s1 s 2 Lungs: ronchi diffuse, improved rt base Abdomen: Distended unchanged, wound site clean, hypoBS Musculoskeletal: 3+ edema Skin: no rashes  LABS:  Recent Labs Lab 03/05/12 0430  03/06/12 0449  03/07/12 0438 03/07/12 0453  03/08/12 0405 03/08/12 0828 03/08/12 1615 03/09/12 0016 03/09/12 0530  HGB 15.1  --  13.3  --   --  14.7  --  14.6  --   --   --  14.1  WBC 29.9*  --  25.1*  --   --  26.7*  --  35.8*  --   --   --  42.9*  PLT 104*  --  107*  --   --  114*  --  174  --   --   --  284  NA 135  < > 136  < >  --  135  < > 135  --  136  --  131*  K 6.2*  < > 5.7*  < >  --  5.1  < > 5.0  --  4.6  --  4.3  CL 107  < > 109  < >  --  106  < > 101  --  104  --  101  CO2 23  < > 23  < >  --  24  < > 25  --  24  --  23  GLUCOSE 250*  < > 243*  < >  --  231*  < > 171*  --  235*  --  234*  BUN 72*  < > 71*  < >  --  71*  < > 84*  --  91*  --  91*  CREATININE 1.45*  < > 1.42*  < >  --  1.43*  < > 1.52*  --  1.67*  --  1.70*  CALCIUM 6.8*  < > 6.2*  < >  --  6.5*  < > 6.6*   --  6.3*  --  6.7*  MG 2.8*  --   --   --   --   --   --  2.4  --   --   --  2.3  PHOS 3.3  --   --   --   --   --   --  3.8  --   --   --  4.1  AST 25  --  35  --   --  32  --  49*  --   --   --   --   ALT 13  --  15  --   --  13  --  17  --   --   --   --   ALKPHOS 203*  --  315*  --   --  300*  --  340*  --   --   --   --   BILITOT 0.5  --  0.3  --   --  0.4  --  0.5  --   --   --   --   PROT 3.9*  --  3.4*  --   --  3.7*  --  3.9*  --   --   --   --   ALBUMIN 1.1*  --  0.9*  --   --  1.0*  --  1.1*  --   --   --   --   PHART  --   < >  --   < > 7.385  --   --   --  7.411  --  7.354  --   PCO2ART  --   < >  --   < > 36.7  --   --   --  33.9*  --  36.4  --   PO2ART  --   < >  --   < > 74.7*  --   --   --  80.0  --  73.6*  --   < > = values in this interval not displayed.  Recent Labs Lab 03/08/12 1546 03/08/12 2115 03/08/12 2356 03/09/12 0417 03/09/12 0748  GLUCAP 204* 163* 191* 182* 194*    Imaging: Dg Chest Port 1 View  03/09/2012  *RADIOLOGY REPORT*  Clinical Data: Evaluate endotracheal tube and pleural effusions  PORTABLE CHEST - 1 VIEW  Comparison: 03/08/2012; 03/07/2012; 03/05/2012  Findings:  Grossly unchanged cardiac silhouette and mediastinal contours. Stable positioning of support apparatus.  Grossly unchanged small layering bilateral pleural effusions and bilateral mid and lower lung heterogeneous opacities, left greater than right.  No new focal airspace opacity. No definite evidence of edema.  No pneumothorax.  Unchanged bones.  IMPRESSION: 1.  Stable positioning of support apparatus.  No pneumothorax. 2.  Grossly unchanged findings of bilateral pleural effusions and bilateral mid and lower lung opacities, left greater than right, atelectasis versus infiltrate. No definite evidence of edema.   Original Report Authenticated By: Tacey Ruiz, MD    Dg Chest Port 1 View  03/08/2012  *RADIOLOGY REPORT*  Clinical Data: Shortness of breath  PORTABLE CHEST - 1 VIEW  Comparison:  03/07/2012  Findings: Endotracheal tube terminates 4 cm above the carina.  Stable left IJ venous catheter.  Enteric tube courses below the diaphragm.  Possible mild interstitial edema, improved.  Moderate layering bilateral pleural effusions, unchanged.  No pneumothorax.  The heart is top normal in size.  IMPRESSION: Endotracheal tube terminates 4 cm above the carina.  Possible mild interstitial edema, improved.  Moderate layering bilateral pleural effusions.   Original Report Authenticated By: Charline Bills, M.D.      ASSESSMENT / PLAN:  PULMONARY A: Acute respiratory failure in setting of septic shock and volume overload. Lt pleural effusion. P:   Changed to PS 12 throughout night, abg reviewed For further air hunger on rest can change to [facility] and increase T V, no ards Wean today goal ps 5, cpap 5, goal 1 hr, assess rsbi Lasix, may need drip pcxr in am  Upright position  CARDIOVASCULAR A: Septic Shock - Secondary to C-diff colitis; off pressors 2/08. Restarted 2/13 (relatd to volume?) Atrial Fibrillation with RVR. Low [place] score. High output from JP's Clot around picc, dvt rue 2/11 P: .  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 Hx of ulcerative colitis. HIGH output from JP's ?ostomy status P:   Post-op care per CCS Continue TNA, until to 40 cc/hr TF, then dc D/w surgery, to increase today lft in am   HEMATOLOGIC A: Leukocytosis worsening, volume status hemoconcetration? New sepsis? No fever Thrombocytopenia. Improved dvt picc aassociated rue 2/11 Small splenic hematoma P:  F/u CBC in am scd Heparin for fib and dvt tolerated well, cbc noted NOT to drop Upper ext edema,  doppler  And legs  INFECTIOUS A: C diff colitis, leukocytosis ( volume?) afebrile P:   Continue enteral vancomycin and IV flagyl Wbc follow with diff in am  UA Repeat BC, site appears clean Avoid empiric coverage with cdiff when able May need to dc line May need repeat CT for abscess, was done on monday  ENDOCRINE A: Hyperglycemia. uncontrolled Relative adrenal insufficiency. P:   SSI Lantus  to 25 Maintain  solucortef to full stress dose on pressors Insulin in tpn, so if TPN offf, will need lantus increase further  NEUROLOGIC A:  Acute encephalopathy IMproved daily, severe debilliattion P:   Chair position daily, successful Pt/ot  Updated family at bedside  CC time 30 minutes.  Mcarthur Rossetti. Tyson Alias, MD, FACP Pgr: [phone]  Pulmonary & Critical Care

## 2012-03-09 NOTE — Progress Notes (Signed)
ANTICOAGULATION CONSULT NOTE - Follow Up Consult  Pharmacy Consult for heparin Indication: DVT  Labs:  Recent Labs  03/06/12 0449  03/07/12 0453 03/07/12 1733  03/08/12 0350 03/08/12 0405 03/08/12 1500 03/08/12 1615 03/08/12 2200  HGB 13.3  --  14.7  --   --   --  14.6  --   --   --   HCT 40.2  --  43.1  --   --   --  42.9  --   --   --   PLT 107*  --  114*  --   --   --  174  --   --   --   HEPARINUNFRC  --   --   --   --   < > 0.61  --  0.70  --  0.45  CREATININE 1.42*  < > 1.43* 1.62*  --   --  1.52*  --  1.67*  --   < > = values in this interval not displayed.  Estimated Creatinine Clearance: 62.6 ml/min (by C-G formula based on Cr of 1.67).   Assessment/Plan:  52yo male now therapeutic on heparin after rate decrease for low goal level.  Will continue gtt at current rate and confirm stable with am labs.  Rogue Bussing PharmD BCPS 03/09/2012,12:57 AM

## 2012-03-09 NOTE — Progress Notes (Signed)
*  PRELIMINARY RESULTS* Vascular Ultrasound Left upper extremity venous duplex has been completed.  Left = no evidence of deep or superficial thrombosis. Consistent with study from 03-06-12. Bilateral lower extremity venous duplex = Bilateral: Technically difficult study. No obvious evidence of DVT in visualized veins. No Baker's Cyst.    Landry Mellow, RDMS, RVT  03/09/2012, 10:29 AM

## 2012-03-09 NOTE — Progress Notes (Signed)
PARENTERAL NUTRITION CONSULT NOTE - FOLLOW UP  Pharmacy Consult for TPN Indication: ileus s/p massive bowel resection (s/p total colectomy d/t toxic megacolon)  Allergies  Allergen Reactions  . Penicillins Other (See Comments)    Heart rate changes    Patient Measurements: Height: 5' 9"  (175.3 cm) Weight: 226 lb 3.1 oz (102.6 kg) IBW/kg (Calculated) : 70.7 Adjusted weight: 78 Kg  Vital Signs: Temp: 98.8 F (37.1 C) (02/14 0400) Temp src: Oral (02/14 0400) BP: 80/50 mmHg (02/14 0800) Pulse Rate: 93 (02/14 0800) Intake/Output from previous day: 02/13 0701 - 02/14 0700 In: 5678.7 [I.V.:2219.7; NG/GT:140; IV Piggyback:1410; MQK:8638] Out: 6785 [Urine:1840; Drains:4495; Stool:450] Intake/Output from this shift: Total I/O In: 20 [I.V.:20] Out: -   Labs:  Recent Labs  03/07/12 0453 03/08/12 0405 03/09/12 0530  WBC 26.7* 35.8* 42.9*  HGB 14.7 14.6 14.1  HCT 43.1 42.9 42.4  PLT 114* 174 284     Recent Labs  03/07/12 0453  03/08/12 0405 03/08/12 1615 03/09/12 0530  NA 135  < > 135 136 131*  K 5.1  < > 5.0 4.6 4.3  CL 106  < > 101 104 101  CO2 24  < > 25 24 23   GLUCOSE 231*  < > 171* 235* 234*  BUN 71*  < > 84* 91* 91*  CREATININE 1.43*  < > 1.52* 1.67* 1.70*  CALCIUM 6.5*  < > 6.6* 6.3* 6.7*  MG  --   --  2.4  --  2.3  PHOS  --   --  3.8  --  4.1  PROT 3.7*  --  3.9*  --   --   ALBUMIN 1.0*  --  1.1*  --   --   AST 32  --  49*  --   --   ALT 13  --  17  --   --   ALKPHOS 300*  --  340*  --   --   BILITOT 0.4  --  0.5  --   --   < > = values in this interval not displayed. Estimated Creatinine Clearance: 59.4 ml/min (by C-G formula based on Cr of 1.7).    Recent Labs  03/08/12 2115 03/08/12 2356 03/09/12 0417  GLUCAP 163* 191* 182*   Insulin Requirements in the past 12 hours:  90 units insulin R in TPN + 12 units Novolog with SSI (since 1800 new bag) + 20 units Lantus daily  Nutritional Goals:  Underfeeding goal: 1771-1657 kcal/day (60-70% of  full goal of 2276 kcal/day), >/= 130g protein per day (90% of full goal of >/= 145 grams per day), >/= 2L fluid/day per RD recommendations on 03/01/12   Current Nutrition:  Clinimix 5/15 at 83 ml/hr provides 100 gm protein/day (~71% goal) and an average of 1627 kcal/day (100% underfeeding goal range)  Assessment: 52 y.o. M with history of ulcerative colitis presented to the hospital with back pain, fever and diarrhea. 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 TPN was initiated on 02/29/12 for nutrition support. Transferred to [facility] on 2/6 for continued management of care.   GI: Hx ulcerative colitis - developed toxic megacolon d/t cdiff colitis. Failed medical management, now POD #10 s/p total colectomy with end ileostomy. Started on TPN 2/5.  Drains 4695 out, stool 450 out last 24h. Appreciate RD re-assessment of needs on 2/12. Vital AF tube feeds started 2/13 at 66m/hr.  Note plans to advance to  goal rate today.  Endo: No hx DM - CBGs improved with increasing Lantus, but still slightly above goal of <150. Hyperglycemia likely related to multiple factors including TPN, enteral feeding initiation, critical illness/insulin resistance and steroid administration (HC 50 mg q6h).   Lytes: K, Mag, Phos WNL. NO electrolytes in TPN. Corrected Ca is 9, normal.  Renal: AKI - Scr now trending back up, UOP stable at 0.7 ml/kg/hr. Lasix drip started for diuresis.  Pulm: FiO2 40%  Cards: Hypotensive-MAP at goal (60) but requiring Phenylephrine.  HR 100 (Afib - new onset this admit). Currently off amiodarone drip.   Hepatobil: AST/ALT ok. Alkphos 340, elevated. TG 154 (2/10), pre-albumin~3 (2/6) now up 12.9 (2/10) , alb 1.  Neuro: Sedated with VDRF. Encephalopathy. Not doing well with WUA, head CT neg 2/10.   ID: Flagyl IV  + Vanc po D#11 (2/3 >> current) for CDiff colitis now s/p colectomy/ileostomy.Tmax 99,  WBC remains markedly elevated; CT= no intra-abdominal abscess noted.  Best Practices: SCDs, MC, IV PPI changed to IV Famotidine via TPN  TPN Access:  L-IJ CVC (place 03/01/12)  TPN day#: 10 (2/5 >> current)  IVF: 1/2 NS at 64m/hr  Plan:  - Decrease Clinimix 5/15 to 40 ml/hr - Change Famotidine to 231mIV q12h IVPB in anticipation of TPN discontinuation on 2/15. - Lipids, MVI, TE on MWF only d/t national shortage - Decrease insulin in TPN bag to 45 units (proportional to rate decrease). - Will f/up labs and tube feeding tolerance in AM  MiLegrand ComoPharm.D., BCPS Clinical Pharmacist Phone: [phone] [phone]: [phone]/14/2014, 8:24 AM

## 2012-03-09 NOTE — Progress Notes (Signed)
1950 walk into patients room and notice patient is having a labored respirations and sat 85% and B/P running in 60s. respiratory contacted, pt suctioned and md notified. Pt sat came back to 96. MD order to changed vent settings.  family concern about starting pressors.Stated Dr Titus Mould had said blood pressure of 50 was okay. Try to clarify family that Dr Titus Mould was talking about the MAP of (55) not the systolic b/p. Family insisted on talking with MD . Dr z  Notified and spoke with family. Family concern about sticking patient. Said Dr Titus Mould had said he did not want a peripheral line or patient being stock. That it was better to wait until AM and run it by him. MD notified. However RT needed blood gas and so we where able to get blood. Hep. Level this morning could not be drawn because of numerous try from lab and nurses.

## 2012-03-10 ENCOUNTER — Inpatient Hospital Stay (HOSPITAL_COMMUNITY): Payer: BC Managed Care – PPO

## 2012-03-10 DIAGNOSIS — Z933 Colostomy status: Secondary | ICD-10-CM

## 2012-03-10 LAB — COMPREHENSIVE METABOLIC PANEL
ALT: 41 U/L (ref 0–53)
Alkaline Phosphatase: 693 U/L — ABNORMAL HIGH (ref 39–117)
BUN: 104 mg/dL — ABNORMAL HIGH (ref 6–23)
CO2: 20 mEq/L (ref 19–32)
Chloride: 102 mEq/L (ref 96–112)
GFR calc Af Amer: 41 mL/min — ABNORMAL LOW (ref >90)
GFR calc non Af Amer: 36 mL/min — ABNORMAL LOW (ref >90)
Glucose, Bld: 184 mg/dL — ABNORMAL HIGH (ref 70–99)
Potassium: 4.8 mEq/L (ref 3.5–5.1)
Sodium: 134 mEq/L — ABNORMAL LOW (ref 135–145)
Total Bilirubin: 2.3 mg/dL — ABNORMAL HIGH (ref 0.3–1.2)

## 2012-03-10 LAB — BASIC METABOLIC PANEL
Chloride: 106 mEq/L (ref 96–112)
GFR calc Af Amer: 36 mL/min — ABNORMAL LOW (ref >90)
GFR calc non Af Amer: 31 mL/min — ABNORMAL LOW (ref >90)
Glucose, Bld: 238 mg/dL — ABNORMAL HIGH (ref 70–99)
Potassium: 5.3 mEq/L — ABNORMAL HIGH (ref 3.5–5.1)
Sodium: 131 mEq/L — ABNORMAL LOW (ref 135–145)

## 2012-03-10 LAB — GLUCOSE, CAPILLARY
Glucose-Capillary: 162 mg/dL — ABNORMAL HIGH (ref 70–99)
Glucose-Capillary: 196 mg/dL — ABNORMAL HIGH (ref 70–99)
Glucose-Capillary: 199 mg/dL — ABNORMAL HIGH (ref 70–99)

## 2012-03-10 LAB — CBC WITH DIFFERENTIAL/PLATELET
Band Neutrophils: 11 % — ABNORMAL HIGH (ref 0–10)
Blasts: 0 %
HCT: 43.7 % (ref 39.0–52.0)
Lymphocytes Relative: 9 % — ABNORMAL LOW (ref 12–46)
Lymphs Abs: 3.8 10*3/uL (ref 0.7–4.0)
MCHC: 35 g/dL (ref 30.0–36.0)
Monocytes Absolute: 4.2 10*3/uL — ABNORMAL HIGH (ref 0.1–1.0)
Monocytes Relative: 10 % (ref 3–12)
Neutrophils Relative %: 62 % (ref 43–77)
Platelets: 317 10*3/uL (ref 150–400)
Promyelocytes Absolute: 0 %
RDW: 19.2 % — ABNORMAL HIGH (ref 11.5–15.5)
WBC: 42.2 10*3/uL — ABNORMAL HIGH (ref 4.0–10.5)
nRBC: 0 /100 WBC

## 2012-03-10 LAB — URINALYSIS, ROUTINE W REFLEX MICROSCOPIC
Glucose, UA: NEGATIVE mg/dL
Ketones, ur: NEGATIVE mg/dL
pH: 6 (ref 5.0–8.0)

## 2012-03-10 LAB — HEPARIN LEVEL (UNFRACTIONATED): Heparin Unfractionated: <0.1 IU/mL — ABNORMAL LOW (ref 0.30–0.70)

## 2012-03-10 LAB — URINE MICROSCOPIC-ADD ON

## 2012-03-10 LAB — PHOSPHORUS: Phosphorus: 5.6 mg/dL — ABNORMAL HIGH (ref 2.3–4.6)

## 2012-03-10 MED ORDER — HEPARIN (PORCINE) IN NACL 100-0.45 UNIT/ML-% IJ SOLN
850.0000 [IU]/h | INTRAMUSCULAR | Status: DC
  Administered 2012-03-10: 1500 [IU]/h via INTRAVENOUS
  Administered 2012-03-11: 850 [IU]/h via INTRAVENOUS
  Filled 2012-03-10 (×3): qty 250

## 2012-03-10 MED ORDER — SODIUM CHLORIDE 0.9 % IV SOLN
2.0000 g | Freq: Once | INTRAVENOUS | Status: AC
  Administered 2012-03-10: 2 g via INTRAVENOUS
  Filled 2012-03-10: qty 20

## 2012-03-10 MED ORDER — WHITE PETROLATUM GEL
Status: AC
  Administered 2012-03-10: 11:00:00
  Filled 2012-03-10: qty 5

## 2012-03-10 MED ORDER — HEPARIN (PORCINE) IN NACL 100-0.45 UNIT/ML-% IJ SOLN
1400.0000 [IU]/h | INTRAMUSCULAR | Status: DC
  Filled 2012-03-10: qty 250

## 2012-03-10 NOTE — Progress Notes (Signed)
PHARMACY CONSULT NOTE - Follow Up Consult  Pharmacy Consult for Heparin & TPN Indication: DVT, s/p bowel resection  Allergies  Allergen Reactions  . Penicillins Other (See Comments)    Heart rate changes    Patient Measurements: Height: 5' 9"  (175.3 cm) Weight: 213 lb 6.5 oz (96.8 kg) IBW/kg (Calculated) : 70.7 Heparin Dosing Weight: 90kg  Vital Signs: Temp: 98.7 F (37.1 C) (02/15 0800) Temp src: Oral (02/15 0800) BP: 80/56 mmHg (02/15 0900) Pulse Rate: 102 (02/15 0900)  Labs:  Recent Labs  03/08/12 0405  03/08/12 2200 03/09/12 0530 03/09/12 0630 03/09/12 1755 03/10/12 0534 03/10/12 0840  HGB 14.6  --   --  14.1  --   --  15.3  --   HCT 42.9  --   --  42.4  --   --  43.7  --   PLT 174  --   --  284  --   --  317  --   HEPARINUNFRC  --   < > 0.45  --  0.29*  --   --  <0.10*  CREATININE 1.52*  < >  --  1.70*  --  1.81* 2.04*  --   < > = values in this interval not displayed.  Estimated Creatinine Clearance: 48 ml/min (by C-G formula based on Cr of 2.04).  Medications:  Heparin @ 800 units/hr  Assessment: 51yom continues on heparin for positive DVT in right basilic vein near PICC (3/89/37). Heparin level is undetectable this morning. No issues with infusion. Patient does have a small splanchnic hematoma on CT scan but otherwise no overt bleeding noted. CBC stable, platelets increasing.  Nutrition - Tube feeds are at goal of 35 ml/hr.  Tolerating well.  TPN was tapered to 40 ml/hr on 2/14.   Goal of Therapy:  Heparin level 0.3-0.5 units/ml Monitor platelets by anticoagulation protocol: Yes   Plan:  1) Increase heparin to 1100 units/hr 2) Check Heparin level in 6 hours 3) Discontinue TPN when current bag finishes at 1800.  Legrand Como, Pharm.D., BCPS Clinical Pharmacist Phone: [phone] or [phone] Pager: [phone] 03/10/2012, 9:49 AM

## 2012-03-10 NOTE — Progress Notes (Signed)
PT Cancellation Note  Patient Details Name: Connor Small MRN: [id number] DOB: Feb 09, 1960   Cancelled Treatment:    Reason Eval/Treat Not Completed: Medical issues which prohibited therapy (pt currently weaning and hold per RN due to hope for extubation if goes well. Will check back at later date )   Elwyn Reach, [place]

## 2012-03-10 NOTE — Progress Notes (Signed)
General Surgery Note  LOS: 13 days  POD -   11 Days Post-Op Room - 2116  Assessment/Plan: 1. Total abdominal colectomy with end ileostomy, intra-abdominal drain placement x 2 by Dr. Elby Showers (02/28/12):    Flagyl/Vanc  Continue leukocytosis - WBC - 42,200 - 03/10/2012  Abd/Pelv CT 03/05/2012 showed no obvious intra-abdominal process.    Ileostomy with output.  Tolerating tube feedings.  2.  Open wound   Looks okay. Not much granulation tissue.  JP drains putting out large amount - but this appears to be all ascites.   3.  Severe malnutrition -  on TNA   Also receiving tube feedings at 35 cc/hr 4.  C. Diff colitis  5. VDRF -  Possibly extubate today 6. DVT prophylaxis - IV heparin  Has right basilic vein thrombosis  Subjective:  Intubated.   Wife and daughter are at the bedside.  Discussed my findings. Objective:   Filed Vitals:   03/10/12 0800  BP:   Pulse:   Temp: 98.7 F (37.1 C)  Resp:      Intake/Output from previous day:  02/14 0701 - 02/15 0700 In: 2994.2 [I.V.:1519.9; NG/GT:120; IV Piggyback:350; TPN:1004.3] Out: 7430 [Urine:2585; Drains:4195; Stool:650]  Intake/Output this shift:      Physical Exam:   General: Older WM, Intubated. .   Lungs: Bilateral rhonchi   Abdomen: Distended.  Few bowel sounds.  Ostomy in right mid abdomen.  Edematous.   Wound: Open midline wound - not much granulation tissue.    JP drains continue to drain ascitic fluid.     Lab Results:     Recent Labs  03/09/12 0530 03/10/12 0534  WBC 42.9* 42.2*  HGB 14.1 15.3  HCT 42.4 43.7  PLT 284 317    BMET    Recent Labs  03/09/12 1755 03/10/12 0534  NA 131* 134*  K 4.0 4.8  CL 102 102  CO2 21 20  GLUCOSE 250* 184*  BUN 97* 104*  CREATININE 1.81* 2.04*  CALCIUM 6.6* 6.8*    PT/INR  No results found for this basename: LABPROT, INR,  in the last 72 hours  ABG    Recent Labs  03/08/12 0828 03/09/12 0016  PHART 7.411 7.354  HCO3 21.4 19.7*      Studies/Results:  Dg Chest Port 1 View  03/10/2012  *RADIOLOGY REPORT*  Clinical Data: Evaluate endotracheal tube placement.  PORTABLE CHEST - 1 VIEW  Comparison: Chest x-ray 03/09/2012.  Findings: An endotracheal tube is in place with tip 4.0 cm above the carina. There is a the mid superior vena cava-sided internal jugular central venous catheter with tip terminating in the A nasogastric tube is seen extending into the stomach, however, the tip of the nasogastric tube extends below the lower margin of the image.  Lung volumes are low.  Persistent elevation of the left hemidiaphragm.  Small to moderate right-sided pleural effusion. Left-sided pleural effusion has resolved.  Bibasilar linear opacities favored to reflect subsegmental atelectasis.  Pulmonary vasculature is within normal limits.  Heart size is normal. The patient is rotated to the left on today's exam, resulting in distortion of the mediastinal contours and reduced diagnostic sensitivity and specificity for mediastinal pathology. Atherosclerosis in the thoracic aorta.  IMPRESSION: 1.  Support apparatus, as above. 2.  Improving aeration throughout the lung bases bilaterally, most compatible with decreasing bilateral pleural effusions (particularly on the left), and improving bibasilar areas of subsegmental atelectasis. 3.  Atherosclerosis.   Original Report Authenticated By: Vinnie Langton, M.D.  Dg Chest Port 1 View  03/09/2012  *RADIOLOGY REPORT*  Clinical Data: Evaluate endotracheal tube and pleural effusions  PORTABLE CHEST - 1 VIEW  Comparison: 03/08/2012; 03/07/2012; 03/05/2012  Findings:  Grossly unchanged cardiac silhouette and mediastinal contours. Stable positioning of support apparatus.  Grossly unchanged small layering bilateral pleural effusions and bilateral mid and lower lung heterogeneous opacities, left greater than right.  No new focal airspace opacity. No definite evidence of edema.  No pneumothorax.  Unchanged bones.   IMPRESSION: 1.  Stable positioning of support apparatus.  No pneumothorax. 2.  Grossly unchanged findings of bilateral pleural effusions and bilateral mid and lower lung opacities, left greater than right, atelectasis versus infiltrate. No definite evidence of edema.   Original Report Authenticated By: Jake Seats, MD      Anti-infectives:   Anti-infectives   Start     Dose/Rate Route Frequency Ordered Stop   03/05/12 2200  metroNIDAZOLE (FLAGYL) IVPB 500 mg    Comments:  First dose ASAP   500 mg 100 mL/hr over 60 Minutes Intravenous Every 8 hours 03/05/12 1346     03/02/12 1200  vancomycin (VANCOCIN) 50 mg/mL oral solution 500 mg     500 mg Oral 4 times per day 03/02/12 0913     03/01/12 1445  vancomycin (VANCOCIN) 50 mg/mL oral solution 500 mg  Status:  Discontinued     500 mg Oral 3 times per day 03/01/12 1340 03/02/12 0913   03/01/12 1400  aztreonam (AZACTAM) 1 g in dextrose 5 % 50 mL IVPB  Status:  Discontinued     1 g 100 mL/hr over 30 Minutes Intravenous 3 times per day 03/01/12 1356 03/02/12 1146   03/01/12 1300  vancomycin (VANCOCIN) 1,250 mg in sodium chloride 0.9 % 250 mL IVPB  Status:  Discontinued     1,250 mg 166.7 mL/hr over 90 Minutes Intravenous Every 12 hours 03/01/12 1127 03/02/12 1146   02/29/12 0600  ciprofloxacin (CIPRO) IVPB 400 mg     400 mg 200 mL/hr over 60 Minutes Intravenous On call to O.R. 02/28/12 1409 02/28/12 1438   02/28/12 2200  vancomycin (VANCOCIN) 500 mg in sodium chloride irrigation 0.9 % 60 mL ENEMA  Status:  Discontinued     500 mg Rectal 2 times daily 02/28/12 1504 02/28/12 1817   02/28/12 1200  vancomycin (VANCOCIN) 50 mg/mL oral solution 500 mg  Status:  Discontinued    Comments:  First dose ASAP   500 mg Per Tube 4 times per day 02/28/12 0814 02/28/12 1817   02/28/12 1000  vancomycin (VANCOCIN) IVPB 1000 mg/200 mL premix  Status:  Discontinued     1,000 mg 200 mL/hr over 60 Minutes Intravenous Every 12 hours 02/28/12 0845 03/01/12 1127    02/28/12 1000  vancomycin (VANCOCIN) 500 mg in sodium chloride irrigation 0.9 % 100 mL ENEMA  Status:  Discontinued     500 mg Rectal 2 times daily 02/28/12 0923 02/28/12 1504   02/27/12 1600  metroNIDAZOLE (FLAGYL) IVPB 500 mg  Status:  Discontinued     500 mg 100 mL/hr over 60 Minutes Intravenous Every 8 hours 02/27/12 1546 02/27/12 2000   02/27/12 1600  ciprofloxacin (CIPRO) IVPB 400 mg  Status:  Discontinued     400 mg 200 mL/hr over 60 Minutes Intravenous Every 12 hours 02/27/12 1547 02/27/12 2000   02/27/12 1545  metroNIDAZOLE (FLAGYL) IVPB 500 mg  Status:  Discontinued     500 mg 100 mL/hr over 60 Minutes Intravenous  Once  02/27/12 1535 02/28/12 0843   02/27/12 1545  vancomycin (VANCOCIN) 500 mg in sodium chloride 0.9 % 100 mL IVPB  Status:  Discontinued     500 mg 100 mL/hr over 60 Minutes Intravenous  Once 02/27/12 1535 02/27/12 1538   02/27/12 1545  ciprofloxacin (CIPRO) IVPB 400 mg  Status:  Discontinued     400 mg 200 mL/hr over 60 Minutes Intravenous  Once 02/27/12 1538 02/28/12 0843   02/27/12 1415  vancomycin (VANCOCIN) 500 mg in sodium chloride 0.9 % 100 mL IVPB     500 mg 100 mL/hr over 60 Minutes Intravenous  Once 02/27/12 1402 02/27/12 1411   02/27/12 1400  vancomycin (VANCOCIN) powder 500 mg  Status:  Discontinued     500 mg Other To Surgery 02/27/12 1359 02/27/12 1402   02/27/12 1200  metroNIDAZOLE (FLAGYL) IVPB 500 mg  Status:  Discontinued    Comments:  First dose ASAP   500 mg 100 mL/hr over 60 Minutes Intravenous Every 6 hours 02/27/12 0953 03/05/12 1346   02/27/12 1200  vancomycin (VANCOCIN) 50 mg/mL oral solution 250 mg  Status:  Discontinued    Comments:  First dose ASAP   250 mg Oral 4 times per day 02/27/12 0953 02/28/12 0814   02/26/12 1700  oseltamivir (TAMIFLU) capsule 75 mg     75 mg Oral  Once 02/26/12 1651 02/26/12 1715      Alphonsa Overall, MD, [place] Pager: [phone],   [facility] Office: [phone] 03/10/2012

## 2012-03-10 NOTE — Progress Notes (Signed)
ANTICOAGULATION CONSULT NOTE - Follow Up Consult  Pharmacy Consult for Heparin Indication: DVT  Allergies  Allergen Reactions  . Penicillins Other (See Comments)    Heart rate changes    Patient Measurements: Height: 5' 9"  (175.3 cm) Weight: 213 lb 6.5 oz (96.8 kg) IBW/kg (Calculated) : 70.7 Heparin Dosing Weight: 95kg  Vital Signs: Temp: 98.8 F (37.1 C) (02/15 1548) Temp src: Oral (02/15 1548) BP: 87/49 mmHg (02/15 1302) Pulse Rate: 104 (02/15 1638)  Labs:  Recent Labs  03/08/12 0405  03/09/12 0530 03/09/12 0630 03/09/12 1755 03/10/12 0534 03/10/12 0840 03/10/12 1710  HGB 14.6  --  14.1  --   --  15.3  --   --   HCT 42.9  --  42.4  --   --  43.7  --   --   PLT 174  --  284  --   --  317  --   --   HEPARINUNFRC  --   < >  --  0.29*  --   --  <0.10* <0.10*  CREATININE 1.52*  < > 1.70*  --  1.81* 2.04*  --   --   < > = values in this interval not displayed.  Estimated Creatinine Clearance: 48 ml/min (by C-G formula based on Cr of 2.04).   Medications:  Heparin @ 1100 units/hr  Assessment: 51yom continues on heparin for positive DVT in right basilic vein near PICC (8/92/11). Heparin level remains undetectable despite rate increase this morning. No issues with the infusion per RN. Lab drawn correctly. RN is going to change the line that heparin is running through once the TPN is discontinued.  Small splanchic hematoma on CT scan but otherwise no overt bleeding. Will avoid bolusing.  Goal of Therapy:  Heparin level 0.3-0.5 Monitor platelets by anticoagulation protocol: Yes   Plan:  1) Increase heparin to 1500 units/hr 2) 6 hour heparin level after rate change  Deboraha Sprang 03/10/2012,6:12 PM

## 2012-03-10 NOTE — Progress Notes (Signed)
[place] Progress Note Patient Name: Connor Small DOB: 07-17-1960 MRN: [id number]  Date of Service  03/10/2012   HPI/Events of Note   Hypocalcemia  eICU Interventions  Replaced calcium   Intervention Category Intermediate Interventions: Electrolyte abnormality - evaluation and management  Raigan Baria J 03/10/2012, 6:51 PM

## 2012-03-10 NOTE — Progress Notes (Signed)
PULMONARY  / CRITICAL CARE MEDICINE  Name: Connor Small MRN: [id number] DOB: Jan 10, 1961    ADMISSION DATE:  02/26/2012 CONSULTATION DATE:  2/5  REFERRING MD :  Karilyn Cota  CHIEF COMPLAINT:  Acute resp failure   BRIEF PATIENT DESCRIPTION:  60 YOM admitted to [facility] for Cdiff on 2/2. Underwent decompressive cecostomy 2/3, initially was better, then worsened on 2/4 with increased abdominal distension and tenderness, decreased renal function.  He was taken back to the OR on 2/4 for total abdominal colectomy and end ileostomy.  Post op he developed ARDS and sepsis shock and was being treated for this.  The morning of 2/6 he had new onset of A-fib and transferred to [facility].    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 2/10- CT abdo /pelvis- small hematoma likely subcapsular spleen, JPs wnl 2/11-borderline BP, pos balance, tube feeds started 2/11 clot around picc, picc dc'ed 2/12-continued borderline BP, neg balance 1 liter 2/14- neo required, 1.2 liters neg, air hunger, changed to PS 12  LINES / TUBES: ETT - OSH 2/4>>> Foley-OSH 2/3>>> PICC OSH 2/4>>>2/11 Art Line - OSH 2/5>>>out  CULTURES: BCx2 2/4>>>negative BCx2 2/3>>>negative UC 2/3- Negative MRSA PCR 2/3- Negative C diff 2/6>>>Positive Body fluid 2/9>>>WBCs, no organisms  ANTIBIOTICS: Flagyl 2/3>>> Vanc 2/6 (IV) >>>2/7 azactam 2/6>>>2/7 Oral vanc 2/6>>>  SUBJECTIVE:   remains on pressors,  Weaning on PS   VITAL SIGNS: Temp:  [97 F (36.1 C)-99 F (37.2 C)] 98.5 F (36.9 C) (02/15 1154) Pulse Rate:  [98-108] 103 (02/15 1302) Resp:  [20-27] 23 (02/15 1302) BP: (59-92)/(28-58) 87/49 mmHg  (02/15 1302) SpO2:  [97 %-100 %] 98 % (02/15 1302) FiO2 (%):  [40 %] 40 % (02/15 1303) Weight:  [96.8 kg (213 lb 6.5 oz)] 96.8 kg (213 lb 6.5 oz) (02/15 0500) HEMODYNAMICS: CVP:  [5 mmHg-6 mmHg] 6 mmHg VENTILATOR SETTINGS: Vent Mode:  [-] PSV FiO2 (%):  [40 %] 40 % Set Rate:  [20 bmp] 20 bmp Vt Set:  [710 mL] 710 mL PEEP:  [5 cmH20] 5 cmH20 Pressure Support:  [5 cmH20-10 cmH20] 10 cmH20 Plateau Pressure:  [13 cmH20-19 cmH20] 13 cmH20 INTAKE / OUTPUT: Intake/Output     02/14 0701 - 02/15 0700 02/15 0701 - 02/16 0700   I.V. (mL/kg) 1539.9 (15.9) 534.1 (5.5)   NG/GT 120    IV Piggyback 350 200   TPN 1044.3 350   Total Intake(mL/kg) 3054.2 (31.6) 1084.1 (11.2)   Urine (mL/kg/hr) 2585 (1.1) 910 (1.2)   Drains 4195 (1.8) 530 (0.7)   Stool 650 (0.3)    Total Output 7430 1440   Net -4375.8 -355.9          PHYSICAL EXAMINATION: General:awake, follows commands Neuro: rass -1, nonfocal HEENT: ETT in place Cardiovascular: irregular s1 s 2 Lungs: ronchi diffuse, improved rt base Abdomen: Distended unchanged, wound site clean, hypoBS Musculoskeletal: 2+ edema Skin: no rashes  LABS:  Recent Labs Lab 03/05/12 0430  03/07/12 0438 03/07/12 0453  03/08/12 0405 03/08/12 0828  03/09/12 0016 03/09/12 0530 03/09/12 1755 03/10/12 0534  HGB 15.1  < >  --  14.7  --  14.6  --   --   --  14.1  --  15.3  WBC 29.9*  < >  --  26.7*  --  35.8*  --   --   --  42.9*  --  42.2*  PLT 104*  < >  --  114*  --  174  --   --   --  284  --  317  NA 135  < >  --  135  < > 135  --   < >  --  131* 131* 134*  K 6.2*  < >  --  5.1  < > 5.0  --   < >  --  4.3 4.0 4.8  CL 107  < >  --  106  < > 101  --   < >  --  101 102 102  CO2 23  < >  --  24  < > 25  --   < >  --  23 21 20   GLUCOSE 250*  < >  --  231*  < > 171*  --   < >  --  234* 250* 184*  BUN 72*  < >  --  71*  < > 84*  --   < >  --  91* 97* 104*  CREATININE 1.45*  < >  --  1.43*  < > 1.52*  --   < >  --  1.70* 1.81* 2.04*  CALCIUM 6.8*  < >   --  6.5*  < > 6.6*  --   < >  --  6.7* 6.6* 6.8*  MG 2.8*  --   --   --   --  2.4  --   --   --  2.3  --   --   PHOS 3.3  --   --   --   --  3.8  --   --   --  4.1  --   --   AST 25  < >  --  32  --  49*  --   --   --   --   --  117*  ALT 13  < >  --  13  --  17  --   --   --   --   --  41  ALKPHOS 203*  < >  --  300*  --  340*  --   --   --   --   --  693*  BILITOT 0.5  < >  --  0.4  --  0.5  --   --   --   --   --  2.3*  PROT 3.9*  < >  --  3.7*  --  3.9*  --   --   --   --   --  4.0*  ALBUMIN 1.1*  < >  --  1.0*  --  1.1*  --   --   --   --   --  1.1*  PHART  --   < > 7.385  --   --   --  7.411  --  7.354  --   --   --   PCO2ART  --   < > 36.7  --   --   --  33.9*  --  36.4  --   --   --   PO2ART  --   < > 74.7*  --   --   --  80.0  --  73.6*  --   --   --   < > = values in this interval not displayed.  Recent Labs Lab 03/09/12 2022 03/10/12 0024 03/10/12 0345 03/10/12 0750 03/10/12 1139  GLUCAP 164* 196* 178* 162* 210*    Imaging: Dg Chest Port 1 View  03/10/2012  *RADIOLOGY REPORT*  Clinical Data: Evaluate endotracheal tube placement.  PORTABLE CHEST - 1 VIEW  Comparison: Chest x-ray 03/09/2012.  Findings: An endotracheal tube is in place with tip 4.0 cm above the carina. There is a the mid superior vena cava-sided internal jugular central venous catheter with tip terminating in the A nasogastric tube is seen extending into the stomach, however, the tip of the nasogastric tube extends below the lower margin of the image.  Lung volumes are low.  Persistent elevation of the left hemidiaphragm.  Small to moderate right-sided pleural effusion. Left-sided pleural effusion has resolved.  Bibasilar linear opacities favored to reflect subsegmental atelectasis.  Pulmonary vasculature is within normal limits.  Heart size is normal. The patient is rotated to the left on today's exam, resulting in distortion of the mediastinal contours and reduced diagnostic sensitivity and specificity for  mediastinal pathology. Atherosclerosis in the thoracic aorta.  IMPRESSION: 1.  Support apparatus, as above. 2.  Improving aeration throughout the lung bases bilaterally, most compatible with decreasing bilateral pleural effusions (particularly on the left), and improving bibasilar areas of subsegmental atelectasis. 3.  Atherosclerosis.   Original Report Authenticated By: Trudie Reed, M.D.    Dg Chest Port 1 View  03/09/2012  *RADIOLOGY REPORT*  Clinical Data: Evaluate endotracheal tube and pleural effusions  PORTABLE CHEST - 1 VIEW  Comparison: 03/08/2012; 03/07/2012; 03/05/2012  Findings:  Grossly unchanged cardiac silhouette and mediastinal contours. Stable positioning of support apparatus.  Grossly unchanged small layering bilateral pleural effusions and bilateral mid and lower lung heterogeneous opacities, left greater than right.  No new focal airspace opacity. No definite evidence of edema.  No pneumothorax.  Unchanged bones.  IMPRESSION: 1.  Stable positioning of support apparatus.  No pneumothorax. 2.  Grossly unchanged findings of bilateral pleural effusions and bilateral mid and lower lung opacities, left greater than right, atelectasis versus infiltrate. No definite evidence of edema.   Original Report Authenticated By: Tacey Ruiz, MD    CXR 2/15 >Improving aeration throughout the lung bases bilaterally, most  compatible with decreasing bilateral pleural effusions  (particularly on the left), and improving bibasilar areas of  subsegmental atelectasis.   ASSESSMENT / PLAN:  PULMONARY A: Acute respiratory failure in setting of septic shock and volume overload. Lt pleural effusion. -good response w/ Lasix drip -4L bal   P:    Wean as tolerated, not ready for extubation  Consider in am if able to get off pressors.  Will at least give trial of extubation before trach  pcxr in am     CARDIOVASCULAR A: Septic Shock - Secondary to C-diff colitis; off pressors 2/08. Restarted 2/13  (relatd to volume?) Atrial Fibrillation with RVR. Low [place] score. High output from JP's Clot around picc, dvt rue 2/11 P: .  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 Hx of ulcerative colitis.  HIGH output from JP's ?ostomy status P:   Post-op care per CCS Continue TNA, until to 40 cc/hr TF, then dc D/w surgery, to increase today lft in am   HEMATOLOGIC A: Leukocytosis worsening, volume status hemoconcetration? New sepsis? No fever Thrombocytopenia. Improved dvt picc aassociated rue 2/11 Small splenic hematoma P:  F/u CBC in am scd Heparin for fib and dvt tolerated well, cbc noted NOT to drop Upper ext edema, doppler  And legs  INFECTIOUS A: C diff colitis, leukocytosis ( volume?) afebrile P:   Continue enteral vancomycin and IV flagyl Wbc follow with diff in am   Avoid empiric coverage with cdiff when able May need to dc line May need repeat CT for abscess, was done on Monday Repeat pan cx   ENDOCRINE A: Hyperglycemia. uncontrolled Relative adrenal insufficiency. P:   SSI Lantus to Maintain  solucortef to full stress dose on pressors Insulin in tpn, so if TPN offf, will need lantus increase further  NEUROLOGIC A:  Acute encephalopathy IMproved daily, severe debilliattion P:      Updated family at bedside  CC time 30 minutes.  PARRETT,TAMMY NP -C  [facility]   I have interviewed and examined the patient and reviewed the database. I have formulated the assessment and plan as reflected in the note above with amendments made by me. 40 mins of direct critical care time provided. Wife updated in detail @ bedside  Billy Fischer, MD;   PCCM service; [place] [phone]

## 2012-03-11 ENCOUNTER — Inpatient Hospital Stay (HOSPITAL_COMMUNITY): Payer: BC Managed Care – PPO

## 2012-03-11 LAB — COMPREHENSIVE METABOLIC PANEL
ALT: 55 U/L — ABNORMAL HIGH (ref 0–53)
AST: 129 U/L — ABNORMAL HIGH (ref 0–37)
Albumin: 1.3 g/dL — ABNORMAL LOW (ref 3.5–5.2)
CO2: 19 mEq/L (ref 19–32)
Chloride: 102 mEq/L (ref 96–112)
GFR calc non Af Amer: 30 mL/min — ABNORMAL LOW (ref >90)
Potassium: 5 mEq/L (ref 3.5–5.1)
Sodium: 133 mEq/L — ABNORMAL LOW (ref 135–145)
Total Bilirubin: 3.9 mg/dL — ABNORMAL HIGH (ref 0.3–1.2)

## 2012-03-11 LAB — BLOOD GAS, ARTERIAL
Bicarbonate: 15.4 mEq/L — ABNORMAL LOW (ref 20.0–24.0)
Drawn by: 252031
O2 Saturation: 99.1 %
PEEP: 5 cmH2O
Patient temperature: 98.1
RATE: 20 resp/min
pH, Arterial: 7.391 (ref 7.350–7.450)
pO2, Arterial: 145 mmHg — ABNORMAL HIGH (ref 80.0–100.0)

## 2012-03-11 LAB — GLUCOSE, CAPILLARY
Glucose-Capillary: 169 mg/dL — ABNORMAL HIGH (ref 70–99)
Glucose-Capillary: 180 mg/dL — ABNORMAL HIGH (ref 70–99)
Glucose-Capillary: 207 mg/dL — ABNORMAL HIGH (ref 70–99)
Glucose-Capillary: 222 mg/dL — ABNORMAL HIGH (ref 70–99)

## 2012-03-11 LAB — CBC
HCT: 45.3 % (ref 39.0–52.0)
Hemoglobin: 15.3 g/dL (ref 13.0–17.0)
MCV: 96.6 fL (ref 78.0–100.0)
RBC: 4.69 MIL/uL (ref 4.22–5.81)
WBC: 41.2 10*3/uL — ABNORMAL HIGH (ref 4.0–10.5)

## 2012-03-11 LAB — HEPARIN LEVEL (UNFRACTIONATED): Heparin Unfractionated: 0.72 IU/mL — ABNORMAL HIGH (ref 0.30–0.70)

## 2012-03-11 LAB — MAGNESIUM: Magnesium: 2.5 mg/dL (ref 1.5–2.5)

## 2012-03-11 LAB — PHOSPHORUS: Phosphorus: 5.6 mg/dL — ABNORMAL HIGH (ref 2.3–4.6)

## 2012-03-11 LAB — PROCALCITONIN: Procalcitonin: 4.22 ng/mL

## 2012-03-11 MED ORDER — HYDROCORTISONE SOD SUCCINATE 100 MG IJ SOLR
50.0000 mg | Freq: Two times a day (BID) | INTRAMUSCULAR | Status: DC
  Administered 2012-03-11 – 2012-03-12 (×2): 50 mg via INTRAVENOUS
  Filled 2012-03-11 (×4): qty 1

## 2012-03-11 MED ORDER — IOHEXOL 300 MG/ML  SOLN
25.0000 mL | INTRAMUSCULAR | Status: AC
  Administered 2012-03-11 (×2): 25 mL via ORAL

## 2012-03-11 MED ORDER — DEXTROSE 5 % IV SOLN
1.0000 g | Freq: Three times a day (TID) | INTRAVENOUS | Status: DC
  Administered 2012-03-11 – 2012-03-12 (×4): 1 g via INTRAVENOUS
  Filled 2012-03-11 (×6): qty 1

## 2012-03-11 MED ORDER — INSULIN GLARGINE 100 UNIT/ML ~~LOC~~ SOLN
30.0000 [IU] | Freq: Every day | SUBCUTANEOUS | Status: DC
  Administered 2012-03-11 – 2012-03-12 (×2): 30 [IU] via SUBCUTANEOUS

## 2012-03-11 MED ORDER — VASOPRESSIN 20 UNIT/ML IJ SOLN
0.0300 [IU]/min | INTRAVENOUS | Status: DC
  Administered 2012-03-11: 0.03 [IU]/min via INTRAVENOUS
  Filled 2012-03-11: qty 2.5

## 2012-03-11 MED ORDER — NOREPINEPHRINE BITARTRATE 1 MG/ML IJ SOLN
2.0000 ug/min | INTRAMUSCULAR | Status: DC
  Administered 2012-03-11: 5 ug/min via INTRAVENOUS
  Administered 2012-03-11: 30 ug/min via INTRAVENOUS
  Filled 2012-03-11 (×2): qty 4

## 2012-03-11 MED ORDER — VANCOMYCIN HCL 1000 MG IV SOLR
750.0000 mg | Freq: Two times a day (BID) | INTRAVENOUS | Status: DC
  Administered 2012-03-11 – 2012-03-12 (×2): 750 mg via INTRAVENOUS
  Filled 2012-03-11 (×3): qty 750

## 2012-03-11 MED ORDER — FAMOTIDINE 40 MG/5ML PO SUSR
20.0000 mg | Freq: Two times a day (BID) | ORAL | Status: DC
  Administered 2012-03-12 – 2012-03-19 (×16): 20 mg
  Filled 2012-03-11 (×20): qty 2.5

## 2012-03-11 MED ORDER — NOREPINEPHRINE BITARTRATE 1 MG/ML IJ SOLN
2.0000 ug/min | INTRAVENOUS | Status: DC
  Administered 2012-03-11: 35 ug/min via INTRAVENOUS
  Administered 2012-03-12: 50 ug/min via INTRAVENOUS
  Administered 2012-03-12: 28 ug/min via INTRAVENOUS
  Administered 2012-03-13: 35 ug/min via INTRAVENOUS
  Administered 2012-03-13: 40 ug/min via INTRAVENOUS
  Administered 2012-03-13: 50 ug/min via INTRAVENOUS
  Administered 2012-03-13: 35 ug/min via INTRAVENOUS
  Administered 2012-03-13: 50 ug/min via INTRAVENOUS
  Administered 2012-03-14: 35 ug/min via INTRAVENOUS
  Administered 2012-03-14: 30 ug/min via INTRAVENOUS
  Administered 2012-03-15: 8 ug/min via INTRAVENOUS
  Filled 2012-03-11 (×12): qty 16

## 2012-03-11 MED ORDER — VANCOMYCIN HCL 1000 MG IV SOLR
750.0000 mg | Freq: Once | INTRAVENOUS | Status: AC
  Administered 2012-03-11: 750 mg via INTRAVENOUS
  Filled 2012-03-11: qty 750

## 2012-03-11 NOTE — Progress Notes (Signed)
ANTICOAGULATION CONSULT NOTE - Follow Up Consult  Pharmacy Consult for heparin Indication: DVT  Allergies  Allergen Reactions  . Penicillins Other (See Comments)    Heart rate changes    Patient Measurements: Height: 5' 9"  (175.3 cm) Weight: 211 lb 3.2 oz (95.8 kg) IBW/kg (Calculated) : 70.7 Heparin Dosing Weight: 95 kg  Vital Signs: Temp: 97.9 F (36.6 C) (02/16 1204) Temp src: Oral (02/16 1204) BP: 92/61 mmHg (02/16 1630) Pulse Rate: 92 (02/16 1630)  Labs:  Recent Labs  03/09/12 0530  03/10/12 0534  03/10/12 1710 03/11/12 0620 03/11/12 1551  HGB 14.1  --  15.3  --   --  15.3  --   HCT 42.4  --  43.7  --   --  45.3  --   PLT 284  --  317  --   --  289  --   HEPARINUNFRC  --   < >  --   < > <0.10* 1.41* 0.72*  CREATININE 1.70*  < > 2.04*  --  2.26* 2.35*  --   < > = values in this interval not displayed.  Estimated Creatinine Clearance: 41.5 ml/min (by C-G formula based on Cr of 2.35).   Medications:  Scheduled:  . antiseptic oral rinse  15 mL Mouth Rinse QID  . aztreonam  1 g Intravenous Q8H  . [COMPLETED] calcium gluconate  2 g Intravenous Once  . chlorhexidine  15 mL Mouth Rinse BID  . famotidine  20 mg Per Tube BID  . feeding supplement  60 mL Per Tube TID  . feeding supplement (VITAL AF 1.2 CAL)  1,000 mL Per Tube Q24H  . hydrocortisone sodium succinate  50 mg Intravenous Q12H  . insulin aspart  0-20 Units Subcutaneous Q4H  . insulin glargine  30 Units Subcutaneous Daily  . [COMPLETED] iohexol  25 mL Oral Q1 Hr x 2  . metronidazole  500 mg Intravenous Q8H  . multivitamin  5 mL Per Tube Daily  . sodium chloride  10-40 mL Intracatheter Q12H  . vancomycin  500 mg Oral Q6H  . [COMPLETED] vancomycin  750 mg Intravenous Once  . vancomycin  750 mg Intravenous Q12H  . [DISCONTINUED] famotidine (PEPCID) IV  20 mg Intravenous Q12H  . [DISCONTINUED] hydrocortisone sodium succinate  50 mg Intravenous Q6H  . [DISCONTINUED] insulin glargine  25 Units  Subcutaneous Daily   Infusions:  . sodium chloride 20 mL/hr at 03/08/12 0848  . sodium chloride 20 mL/hr at 03/06/12 1200  . [EXPIRED] TPN (CLINIMIX) +/- additives 40 mL/hr at 03/09/12 1801   And  . [EXPIRED] fat emulsion 250 mL (03/09/12 1801)  . heparin 1,000 Units/hr (03/11/12 1100)  . norepinephrine (LEVOPHED) Adult infusion 20 mcg/min (03/11/12 1200)  . [DISCONTINUED] furosemide (LASIX) infusion 8 mg/hr (03/09/12 1053)  . [DISCONTINUED] heparin 1,100 Units/hr (03/10/12 1800)  . [DISCONTINUED] heparin 1,400 Units/hr (03/10/12 1830)  . [DISCONTINUED] norepinephrine (LEVOPHED) Adult infusion 30 mcg/min (03/11/12 1253)  . [DISCONTINUED] phenylephrine (NEO-SYNEPHRINE) Adult infusion 200 mcg/min (03/11/12 0656)  . [DISCONTINUED] vasopressin (PITRESSIN) infusion - *FOR SHOCK* 0.03 Units/min (03/11/12 0223)    Assessment: 51yom continues on heparin for positive DVT in right basilic vein near PICC (2/50/53). Heparin level now supertherapeutic after requiring several rate increases to become therapeutic. No issues with the infusion per RN.  Heparin level was 0.72  Goal of Therapy:  Heparin level 0.3-0.5 units/ml Monitor platelets by anticoagulation protocol: Yes   Plan:  1) Reduce heparin drip to 850 units/hr 2) 6hr  heparin level after drip  Quetzalli Clos, Tsz-Yin 03/11/2012,4:35 PM

## 2012-03-11 NOTE — Progress Notes (Signed)
ANTIBIOTIC CONSULT NOTE - INITIAL  Pharmacy Consult for vancomycin and aztreonam Indication: rule out pneumonia and rule out sepsis  Allergies  Allergen Reactions  . Penicillins Other (See Comments)    Heart rate changes    Patient Measurements: Height: 5' 9"  (175.3 cm) Weight: 213 lb 6.5 oz (96.8 kg) IBW/kg (Calculated) : 70.7  Vital Signs: Temp: 97.1 F (36.2 C) (02/16 0100) Temp src: Axillary (02/16 0100) BP: 69/40 mmHg (02/16 0200) Pulse Rate: 46 (02/16 0100) Intake/Output from previous day: 02/15 0701 - 02/16 0700 In: 3555.8 [I.V.:1855.8; NG/GT:280; IV Piggyback:920; TPN:500] Out: 3590 [Urine:2030; Drains:1560] Intake/Output from this shift: Total I/O In: 1292.5 [I.V.:727.5; NG/GT:245; IV Piggyback:320] Out: 985 [Urine:465; Drains:520]  Labs:  Recent Labs  03/08/12 0405  03/09/12 0530 03/09/12 1755 03/10/12 0534 03/10/12 1710  WBC 35.8*  --  42.9*  --  42.2*  --   HGB 14.6  --  14.1  --  15.3  --   PLT 174  --  284  --  317  --   CREATININE 1.52*  < > 1.70* 1.81* 2.04* 2.26*  < > = values in this interval not displayed. Estimated Creatinine Clearance: 43.4 ml/min (by C-G formula based on Cr of 2.26).   Microbiology: Recent Results (from the past 720 hour(s))  CULTURE, BLOOD (ROUTINE X 2)     Status: None   Collection Time    02/27/12  4:58 AM      Result Value Range Status   Specimen Description BLOOD RIGHT ARM   Final   Special Requests BOTTLES DRAWN AEROBIC AND ANAEROBIC 6CC   Final   Culture NO GROWTH 6 DAYS   Final   Report Status 03/04/2012 FINAL   Final  CULTURE, BLOOD (ROUTINE X 2)     Status: None   Collection Time    02/27/12  5:15 AM      Result Value Range Status   Specimen Description BLOOD RIGHT HAND   Final   Special Requests BOTTLES DRAWN AEROBIC AND ANAEROBIC 6CC   Final   Culture NO GROWTH 6 DAYS   Final   Report Status 03/04/2012 FINAL   Final  SURGICAL PCR SCREEN     Status: None   Collection Time    02/27/12  3:15 PM   Result Value Range Status   MRSA, PCR NEGATIVE  NEGATIVE Final   Staphylococcus aureus NEGATIVE  NEGATIVE Final   Comment:            The Xpert SA Assay (FDA     approved for NASAL specimens     in patients over 29 years of age),     is one component of     a comprehensive surveillance     program.  Test performance has     been validated by [employer] for patients greater     than or equal to 67 year old.     It is not intended     to diagnose infection nor to     guide or monitor treatment.  URINE CULTURE     Status: None   Collection Time    02/28/12 10:03 AM      Result Value Range Status   Specimen Description URINE, CATHETERIZED   Final   Special Requests NONE   Final   Culture  Setup Time 02/28/2012 19:00   Final   Colony Count NO GROWTH   Final   Culture NO GROWTH   Final  Report Status 02/29/2012 FINAL   Final  CULTURE, BLOOD (ROUTINE X 2)     Status: None   Collection Time    02/28/12 12:43 PM      Result Value Range Status   Specimen Description BLOOD LEFT ARM   Final   Special Requests BOTTLES DRAWN AEROBIC AND ANAEROBIC 8CC   Final   Culture NO GROWTH 5 DAYS   Final   Report Status 03/04/2012 FINAL   Final  CULTURE, BLOOD (ROUTINE X 2)     Status: None   Collection Time    02/28/12  1:39 PM      Result Value Range Status   Specimen Description BLOOD LEFT ANTECUBITAL   Final   Special Requests BOTTLES DRAWN AEROBIC AND ANAEROBIC [place]   Final   Culture NO GROWTH 5 DAYS   Final   Report Status 03/04/2012 FINAL   Final  MRSA PCR SCREENING     Status: None   Collection Time    03/01/12  2:51 PM      Result Value Range Status   MRSA by PCR NEGATIVE  NEGATIVE Final   Comment:            The GeneXpert MRSA Assay (FDA     approved for NASAL specimens     only), is one component of a     comprehensive MRSA colonization     surveillance program. It is not     intended to diagnose MRSA     infection nor to guide or     monitor treatment for     MRSA  infections.  CLOSTRIDIUM DIFFICILE BY PCR     Status: Abnormal   Collection Time    03/01/12  3:38 PM      Result Value Range Status   C difficile by pcr POSITIVE (*) NEGATIVE Final   Comment: CRITICAL RESULT CALLED TO, READ BACK BY AND VERIFIED WITH:     WHITEK RN 17:05 03/01/12 ([email or web address])  BODY FLUID CULTURE     Status: None   Collection Time    03/04/12  3:34 PM      Result Value Range Status   Specimen Description PLEURAL FLUID   Final   Special Requests NONE   Final   Gram Stain     Final   Value: CYTOSPIN SLIDE WBC PRESENT,BOTH PMN AND MONONUCLEAR     NO ORGANISMS SEEN   Culture NO GROWTH 3 DAYS   Final   Report Status 03/09/2012 FINAL   Final  CULTURE, BLOOD (ROUTINE X 2)     Status: None   Collection Time    03/09/12 12:25 PM      Result Value Range Status   Specimen Description BLOOD LEFT HAND   Final   Special Requests BOTTLES DRAWN AEROBIC ONLY 2CC   Final   Culture  Setup Time 03/09/2012 18:19   Final   Culture     Final   Value:        BLOOD CULTURE RECEIVED NO GROWTH TO DATE CULTURE WILL BE HELD FOR 5 DAYS BEFORE ISSUING A FINAL NEGATIVE REPORT   Report Status PENDING   Incomplete    Medical History: Past Medical History  Diagnosis Date  . Chronic diarrhea   . Rectal bleed   . Hemorrhoids   . Ulcerative colitis     Distal UC over 8 yrs ago diagnosed  . Diverticulitis of large intestine with perforation 10/2011    done at [facility]  Medications:  Prescriptions prior to admission  Medication Sig Dispense Refill  . bifidobacterium infantis (ALIGN) capsule Take 1 capsule by mouth daily.  14 capsule  0  . FORTESTA 10 MG/ACT (2%) GEL at bedtime. Patient states that he applies it to his leg      . ibuprofen (ADVIL,MOTRIN) 200 MG tablet Take 400 mg by mouth 2 (two) times daily as needed. For pain      . Mesalamine (ASACOL HD) 800 MG TBEC Take 2 tablets (1,600 mg total) by mouth 2 (two) times daily.  180 tablet  3  . Pseudoeph-Doxylamine-DM-APAP (NYQUIL D  COLD/FLU) 60-12.06-22-998 MG/30ML LIQD Take 30 mLs by mouth at bedtime. Cold/flu symptoms      . Pseudoephedrine-APAP-DM (DAYQUIL MULTI-SYMPTOM COLD/FLU PO) Take 1 capsule by mouth every 4 (four) hours as needed. Cold symptoms       Scheduled:  . antiseptic oral rinse  15 mL Mouth Rinse QID  . aztreonam  1 g Intravenous Q8H  . [COMPLETED] calcium gluconate  2 g Intravenous Once  . chlorhexidine  15 mL Mouth Rinse BID  . famotidine (PEPCID) IV  20 mg Intravenous Q12H  . feeding supplement  60 mL Per Tube TID  . feeding supplement (VITAL AF 1.2 CAL)  1,000 mL Per Tube Q24H  . hydrocortisone sodium succinate  50 mg Intravenous Q6H  . insulin aspart  0-20 Units Subcutaneous Q4H  . insulin glargine  25 Units Subcutaneous Daily  . metronidazole  500 mg Intravenous Q8H  . multivitamin  5 mL Per Tube Daily  . sodium chloride  10-40 mL Intracatheter Q12H  . vancomycin  500 mg Oral Q6H  . vancomycin  750 mg Intravenous Once  . vancomycin  750 mg Intravenous Q12H  . [COMPLETED] white petrolatum       Infusions:  . sodium chloride 20 mL/hr at 03/08/12 0848  . sodium chloride 20 mL/hr at 03/06/12 1200  . [EXPIRED] TPN (CLINIMIX) +/- additives 40 mL/hr at 03/09/12 1801   And  . [EXPIRED] fat emulsion 250 mL (03/09/12 1801)  . heparin 1,500 Units/hr (03/10/12 2222)  . phenylephrine (NEO-SYNEPHRINE) Adult infusion 200 mcg/min (03/11/12 0205)  . vasopressin (PITRESSIN) infusion - *FOR SHOCK*    . [DISCONTINUED] furosemide (LASIX) infusion 8 mg/hr (03/09/12 1053)  . [DISCONTINUED] heparin 1,100 Units/hr (03/10/12 1800)  . [DISCONTINUED] heparin 1,400 Units/hr (03/10/12 1830)    Assessment: 52yo male has been in ICU, requires TPN for ileus s/p massive bowel resection and heparin for DVT, now with worsening hypotension and leukocytosis, to add IV ABX for possible nosocomial infections.  Goal of Therapy:  Vancomycin trough level 15-20 mcg/ml  Plan:  Will begin vancomycin 7106m IV Q12H and  aztreonam 1g IV Q8H and monitor CBC, Cx, levels prn.  BRogue BussingPharmD BCPS 03/11/2012,2:06 AM

## 2012-03-11 NOTE — Progress Notes (Signed)
eLink Physician-Brief Progress Note Patient Name: Connor Small DOB: 04/28/1960 MRN: [id number]  Date of Service  03/11/2012   HPI/Events of Note   Worsening hypotension WBC 42K Low grade fever today  eICU Interventions  Send resp culture Add vanc/aztreonam to cover nosocomial infections Add vasopressin CVP 10, edematous so fluids not beneficial   Intervention Category Major Interventions: Shock - evaluation and management  Vanesha Athens 03/11/2012, 1:57 AM

## 2012-03-11 NOTE — Progress Notes (Signed)
PULMONARY  / CRITICAL CARE MEDICINE  Name: Connor Small MRN: [id number] DOB: 09/30/1960    ADMISSION DATE:  02/26/2012 CONSULTATION DATE:  2/5  REFERRING MD :  Karilyn Cota  CHIEF COMPLAINT:  Acute resp failure   BRIEF PATIENT DESCRIPTION:  25 YOM admitted to [facility] for Cdiff on 2/2. Underwent decompressive cecostomy 2/3, initially was better, then worsened on 2/4 with increased abdominal distension and tenderness, decreased renal function.  He was taken back to the OR on 2/4 for total abdominal colectomy and end ileostomy.  Post op he developed ARDS and sepsis shock and was being treated for this.  The morning of 2/6 he had new onset of A-fib and transferred to [facility].    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 2/10- CT abdo /pelvis- small hematoma likely subcapsular spleen, JPs wnl 2/11-borderline BP, pos balance, tube feeds started 2/11 clot around picc, picc dc'ed 2/12-continued borderline BP, neg balance 1 liter 2/14- neo required, 1.2 liters neg, air hunger, changed to PS  2/15 -neg 4 l bal on lasix drip, lasix d/c   LINES / TUBES: ETT - OSH 2/4>>> Foley-OSH 2/3>>> PICC OSH 2/4>>>2/11 Art Line - OSH 2/5>>>out  CULTURES: BCx2 2/4>>>negative BCx2 2/3>>>negative UC 2/3- Negative MRSA PCR 2/3- Negative C diff 2/6>>>Positive Body fluid 2/9>>>WBCs, no organisms 2/15 BC x 2 > 2/15 Sputum >  ANTIBIOTICS: Flagyl 2/3>>> Vanc 2/6 (IV) >>>2/7 azactam 2/6>>>2/7 Oral vanc 2/6>>> IV vanc 2/16 > Aztreonam 2/16 >  SUBJECTIVE:  Good response to lasix drip w/ -4 L bal>lasix stopped 2/15  B/p remains low on pressor, vasopressin added overnight WBC tr up 42  K , Vanc and Aztreonam added  Weaning on vent    VITAL SIGNS: Temp:  [97.1 F (36.2 C)-100.4 F (38 C)] 100.4 F (38 C) (02/16 0450) Pulse Rate:  [37-108] 93 (02/16 0744) Resp:  [16-26] 16 (02/16 0744) BP: (64-96)/(28-74) 96/62 mmHg (02/16 0744) SpO2:  [97 %-100 %] 100 % (02/16 0744) FiO2 (%):  [40 %] 40 % (02/16 0744) Weight:  [95.8 kg (211 lb 3.2 oz)] 95.8 kg (211 lb 3.2 oz) (02/16 0500) HEMODYNAMICS: CVP:  [8 mmHg-10 mmHg] 10 mmHg VENTILATOR SETTINGS: Vent Mode:  [-] CPAP FiO2 (%):  [40 %] 40 % Set Rate:  [20 bmp] 20 bmp Vt Set:  [710 mL] 710 mL PEEP:  [5 cmH20] 5 cmH20 Pressure Support:  [5 cmH20-10 cmH20] 5 cmH20 Plateau Pressure:  [15 cmH20-17 cmH20] 17 cmH20 INTAKE / OUTPUT: Intake/Output     02/15 0701 - 02/16 0700 02/16 0701 - 02/17 0700   I.V. (mL/kg) 2375.6 (24.8)    NG/GT 280    IV Piggyback 1070    TPN 500    Total Intake(mL/kg) 4225.6 (44.1)    Urine (mL/kg/hr) 2245 (1)    Drains 1840 (0.8)    Stool     Total Output 4085     Net +140.6            PHYSICAL EXAMINATION: General lethargic, follows simple commands.  Neuro: lethargic , decreased movement on right  HEENT: ETT in place Cardiovascular: irregular s1 s 2 Lungs: diminshed BS in bases  Abdomen: Distended  unchanged, wound site clean, hypoBS Musculoskeletal: 2+ edema, R>L Skin: no rashes  LABS:  Recent Labs Lab 03/08/12 0405 03/08/12 0828  03/09/12 0016 03/09/12 0530  03/10/12 0534 03/10/12 1710 03/11/12 0300 03/11/12 0620  HGB 14.6  --   --   --  14.1  --  15.3  --   --  15.3  WBC 35.8*  --   --   --  42.9*  --  42.2*  --   --  41.2*  PLT 174  --   --   --  284  --  317  --   --  289  NA 135  --   < >  --  131*  < > 134* 131*  --  133*  K 5.0  --   < >  --  4.3  < > 4.8 5.3*  --  5.0  CL 101  --   < >  --  101  < > 102 106  --  102  CO2 25  --   < >  --  23  < > 20 22  --  19  GLUCOSE 171*  --   < >  --  234*  < > 184* 238*  --  243*  BUN 84*  --   < >  --  91*  < > 104* 115*  --   130*  CREATININE 1.52*  --   < >  --  1.70*  < > 2.04* 2.26*  --  2.35*  CALCIUM 6.6*  --   < >  --  6.7*  < > 6.8* 5.7*  --  7.2*  MG 2.4  --   --   --  2.3  --   --  2.2  --  2.5  PHOS 3.8  --   --   --  4.1  --   --  5.6*  --  5.6*  AST 49*  --   --   --   --   --  117*  --   --  129*  ALT 17  --   --   --   --   --  41  --   --  55*  ALKPHOS 340*  --   --   --   --   --  693*  --   --  710*  BILITOT 0.5  --   --   --   --   --  2.3*  --   --  3.9*  PROT 3.9*  --   --   --   --   --  4.0*  --   --  4.1*  ALBUMIN 1.1*  --   --   --   --   --  1.1*  --   --  1.3*  PROCALCITON  --   --   --   --   --   --   --   --   --  4.22  PHART  --  7.411  --  7.354  --   --   --   --  7.391  --   PCO2ART  --  33.9*  --  36.4  --   --   --   --  25.9*  --   PO2ART  --  80.0  --  73.6*  --   --   --   --  145.0*  --   < > =  values in this interval not displayed.  Recent Labs Lab 03/10/12 1139 03/10/12 1540 03/10/12 2019 03/11/12 0046 03/11/12 0428  GLUCAP 210* 199* 206* 169* 222*    Imaging: Dg Chest Port 1 View  03/10/2012  *RADIOLOGY REPORT*  Clinical Data: Evaluate endotracheal tube placement.  PORTABLE CHEST - 1 VIEW  Comparison: Chest x-ray 03/09/2012.  Findings: An endotracheal tube is in place with tip 4.0 cm above the carina. There is a the mid superior vena cava-sided internal jugular central venous catheter with tip terminating in the A nasogastric tube is seen extending into the stomach, however, the tip of the nasogastric tube extends below the lower margin of the image.  Lung volumes are low.  Persistent elevation of the left hemidiaphragm.  Small to moderate right-sided pleural effusion. Left-sided pleural effusion has resolved.  Bibasilar linear opacities favored to reflect subsegmental atelectasis.  Pulmonary vasculature is within normal limits.  Heart size is normal. The patient is rotated to the left on today's exam, resulting in distortion of the mediastinal contours and reduced  diagnostic sensitivity and specificity for mediastinal pathology. Atherosclerosis in the thoracic aorta.  IMPRESSION: 1.  Support apparatus, as above. 2.  Improving aeration throughout the lung bases bilaterally, most compatible with decreasing bilateral pleural effusions (particularly on the left), and improving bibasilar areas of subsegmental atelectasis. 3.  Atherosclerosis.   Original Report Authenticated By: Trudie Reed, M.D.    CXR 2/16 >Improving aeration throughout the lung bases bilaterally,  ASSESSMENT / PLAN:  PULMONARY A: Acute respiratory failure in setting of septic shock and volume overload.  Lt pleural effusion. 2/16 >CXR improved aeration w/ diuresis , Lasix drip off 2/15 . Weaning well on PS. Remains weak.   P:    Wean as tolerated, consider extubation later today if mentation improves .  Sputum Cx pending for low grade fever and high wbc    CARDIOVASCULAR A: Septic Shock - Secondary to C-diff colitis; off pressors 2/08. Restarted 2/13 (relatd to volume?) Atrial Fibrillation with RVR.> Low [place] score. High output from JP's Clot around picc, dvt rue 2/11 >unable to wean off pressors, despite lower MAP goal  Good response to Lasix drip w/ neg 4 L bal . Lasix off 2/15 . Now in SR   P: .  Heparin drip for dvt arm Change Neo to Levophed w/ MAP goal 60 with good MS Decrease stress dose steroids 50 q12 (?contributing to leukocytosis) .    RENAL A:  Acute Renal Failure, elevated output from JP;s Hypervolemia Hyperkalemia 2/16 scr tr up w/ lasix drip -off 2/15  Metabolic acidosis -HCO3 tr down 19   P:   bmet in am  Goal even/neg bal  Monitor UOP closely  Consider IV Bicarb .   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 Hx of ulcerative colitis. HIGH output from JP's ?ostomy status 2/16 TNA d/c 2/15 w/ TF near goal , elevated alk phos persists , T. Bili rising   P:   Post-op care per  CCS Consider CT abd w/ rising alk phos/bili and persistent elevated WBC .  IF extubated , swallow eval       HEMATOLOGIC A: Leukocytosis worsening, volume status hemoconcetration? New sepsis? Low grade fevers  Thrombocytopenia. Resolved  dvt picc aassociated rue 2/11 Small splenic hematoma P:  F/u CBC in am scd Heparin for fib and dvt tolerated well, cbc noted NOT to drop Pan cx pending  INFECTIOUS A: C diff colitis, leukocytosis (  volume?) 2/16 WBC tr up /on pressors ? Steroid r/t vs Infectious source   P:   Continue enteral vancomycin and IV flagyl  Vanc IV and Aztreonam added 2/16 d/t rising wbc /shock  Pan cx pending from 2/15  May need to dc line May need repeat CT for abscess Decrease stress dose steriods   ENDOCRINE A: Hyperglycemia. uncontrolled Relative adrenal insufficiency. P:   SSI Lantus to 30 u  Solucortef 50 q12    NEUROLOGIC A:  Acute encephalopathy IMproved daily, severe debilliattion 2/16: not moving right side , lethargic , follows simple commands  P:   Consider CT head     Updated wife  at bedside  CC time 30 minutes.  PARRETT,TAMMY NP -C  [facility]   I have interviewed and examined the patient and reviewed the database. I have formulated the assessment and plan as reflected in the note above with amendments made by me. 40 mins of direct critical care time provided. Wife updated in detail @ bedside  Billy Fischer, MD;  PCCM service; Mobile [phone]

## 2012-03-11 NOTE — Progress Notes (Signed)
ANTICOAGULATION CONSULT NOTE - Follow Up Consult  Pharmacy Consult for Heparin Indication: DVT  Allergies  Allergen Reactions  . Penicillins Other (See Comments)    Heart rate changes    Patient Measurements: Height: 5' 9"  (175.3 cm) Weight: 211 lb 3.2 oz (95.8 kg) IBW/kg (Calculated) : 70.7 Heparin Dosing Weight: 95kg  Vital Signs: Temp: 100.4 F (38 C) (02/16 0450) Temp src: Oral (02/16 0450) BP: 87/58 mmHg (02/16 0630) Pulse Rate: 96 (02/16 0630)  Labs:  Recent Labs  03/09/12 0530  03/09/12 1755 03/10/12 0534 03/10/12 0840 03/10/12 1710 03/11/12 0620  HGB 14.1  --   --  15.3  --   --  15.3  HCT 42.4  --   --  43.7  --   --  45.3  PLT 284  --   --  317  --   --  289  HEPARINUNFRC  --   < >  --   --  <0.10* <0.10* 1.41*  CREATININE 1.70*  --  1.81* 2.04*  --  2.26*  --   < > = values in this interval not displayed.  Estimated Creatinine Clearance: 43.1 ml/min (by C-G formula based on Cr of 2.26).   Medications:  Heparin @ 1500 units/hr  Assessment: 51yom continues on heparin for positive DVT in right basilic vein near PICC (04/[birth year]). Heparin level now supertherapeutic after requiring several rate increases to become therapeutic. No issues with the infusion per RN.  Small splanchic hematoma on CT scan but otherwise no overt bleeding. Will avoid bolusing.  Goal of Therapy:  Heparin level 0.3-0.5 Monitor platelets by anticoagulation protocol: Yes   Plan:  1)Notified nurse to stop heparin now. 2) at 0815 restart heparin at 1000 units/hr (a 500 unit/hr rate decrease).  Note patient has been on heparin for 5 days and has been within the therapeutic range at rates as low as 700 units/hr.     Thank you for allowing pharmacy to be a part of this patients care team.  Rowe Robert Pharm.D., BCPS Clinical Pharmacist 03/11/2012 7:20 AM Pager: [phone] Phone: [phone]

## 2012-03-11 NOTE — Progress Notes (Signed)
[facility] Progress Note 12 Days Post-Op  Subjective: The patient is a bit more awake this morning, but not moving his right side according to the nurse.  Weaning for possible extubation today.  Objective: Vital signs in last 24 hours: Temp:  [97.1 F (36.2 C)-100.4 F (38 C)] 100.4 F (38 C) (02/16 0450) Pulse Rate:  [37-108] 93 (02/16 0744) Resp:  [16-26] 16 (02/16 0744) BP: (64-96)/(28-74) 96/62 mmHg (02/16 0744) SpO2:  [97 %-100 %] 100 % (02/16 0744) FiO2 (%):  [40 %] 40 % (02/16 0744) Weight:  [95.8 kg (211 lb 3.2 oz)] 95.8 kg (211 lb 3.2 oz) (02/16 0500) Last BM Date: 03/11/12  Intake/Output from previous day: 02/15 0701 - 02/16 0700 In: 4225.6 [I.V.:2375.6; NG/GT:280; IV Piggyback:1070; TPN:500] Out: 2563 [SLHTD:4287; Drains:1840] Intake/Output this shift:    General: No acute distress.  Total body fluid is up  Lungs: Clear  Abd: Soft, some bowel sounds.  Non-tender.  Ileostomy is viable and at the skin surface.  Blake drains are putting out extensive amounts of serous ascites.  Will try to go from bulb suction to passive drainage to see if this will limit output and control drainage.  I believe that if we just pull the drains the patient will just leak from the sites.  Extremities: Edematous globally.  Neuro: Intact, but not moving his right side very well or at all.  Delayed response to questions.    Lab Results:  WBC 41.2K.  Hgb 15 BMET  Recent Labs  03/10/12 1710 03/11/12 0620  NA 131* 133*  K 5.3* 5.0  CL 106 102  CO2 22 19  GLUCOSE 238* 243*  BUN 115* 130*  CREATININE 2.26* 2.35*  CALCIUM 5.7* 7.2*   PT/INR No avaiable ABG  Recent Labs  03/09/12 0016 03/11/12 0300  PHART 7.354 7.391  HCO3 19.7* 15.4*    Studies/Results: Dg Chest Port 1 View  03/10/2012  *RADIOLOGY REPORT*  Clinical Data: Evaluate endotracheal tube placement.  PORTABLE CHEST - 1 VIEW  Comparison: Chest x-ray 03/09/2012.  Findings: An endotracheal tube is in place with tip  4.0 cm above the carina. There is a the mid superior vena cava-sided internal jugular central venous catheter with tip terminating in the A nasogastric tube is seen extending into the stomach, however, the tip of the nasogastric tube extends below the lower margin of the image.  Lung volumes are low.  Persistent elevation of the left hemidiaphragm.  Small to moderate right-sided pleural effusion. Left-sided pleural effusion has resolved.  Bibasilar linear opacities favored to reflect subsegmental atelectasis.  Pulmonary vasculature is within normal limits.  Heart size is normal. The patient is rotated to the left on today's exam, resulting in distortion of the mediastinal contours and reduced diagnostic sensitivity and specificity for mediastinal pathology. Atherosclerosis in the thoracic aorta.  IMPRESSION: 1.  Support apparatus, as above. 2.  Improving aeration throughout the lung bases bilaterally, most compatible with decreasing bilateral pleural effusions (particularly on the left), and improving bibasilar areas of subsegmental atelectasis. 3.  Atherosclerosis.   Original Report Authenticated By: Vinnie Langton, M.D.     Anti-infectives: Anti-infectives   Start     Dose/Rate Route Frequency Ordered Stop   03/11/12 1200  vancomycin (VANCOCIN) 750 mg in sodium chloride 0.9 % 150 mL IVPB     750 mg 150 mL/hr over 60 Minutes Intravenous Every 12 hours 03/11/12 0204     03/11/12 0215  vancomycin (VANCOCIN) 750 mg in sodium chloride 0.9 % 150 mL  IVPB     750 mg 150 mL/hr over 60 Minutes Intravenous  Once 03/11/12 0204 03/11/12 0352   03/11/12 0215  aztreonam (AZACTAM) 1 g in dextrose 5 % 50 mL IVPB     1 g 100 mL/hr over 30 Minutes Intravenous 3 times per day 03/11/12 0204     03/05/12 2200  metroNIDAZOLE (FLAGYL) IVPB 500 mg    Comments:  First dose ASAP   500 mg 100 mL/hr over 60 Minutes Intravenous Every 8 hours 03/05/12 1346     03/02/12 1200  vancomycin (VANCOCIN) 50 mg/mL oral solution 500  mg     500 mg Oral 4 times per day 03/02/12 0913     03/01/12 1445  vancomycin (VANCOCIN) 50 mg/mL oral solution 500 mg  Status:  Discontinued     500 mg Oral 3 times per day 03/01/12 1340 03/02/12 0913   03/01/12 1400  aztreonam (AZACTAM) 1 g in dextrose 5 % 50 mL IVPB  Status:  Discontinued     1 g 100 mL/hr over 30 Minutes Intravenous 3 times per day 03/01/12 1356 03/02/12 1146   03/01/12 1300  vancomycin (VANCOCIN) 1,250 mg in sodium chloride 0.9 % 250 mL IVPB  Status:  Discontinued     1,250 mg 166.7 mL/hr over 90 Minutes Intravenous Every 12 hours 03/01/12 1127 03/02/12 1146   02/29/12 0600  ciprofloxacin (CIPRO) IVPB 400 mg     400 mg 200 mL/hr over 60 Minutes Intravenous On call to O.R. 02/28/12 1409 02/28/12 1438   02/28/12 2200  vancomycin (VANCOCIN) 500 mg in sodium chloride irrigation 0.9 % 60 mL ENEMA  Status:  Discontinued     500 mg Rectal 2 times daily 02/28/12 1504 02/28/12 1817   02/28/12 1200  vancomycin (VANCOCIN) 50 mg/mL oral solution 500 mg  Status:  Discontinued    Comments:  First dose ASAP   500 mg Per Tube 4 times per day 02/28/12 0814 02/28/12 1817   02/28/12 1000  vancomycin (VANCOCIN) IVPB 1000 mg/200 mL premix  Status:  Discontinued     1,000 mg 200 mL/hr over 60 Minutes Intravenous Every 12 hours 02/28/12 0845 03/01/12 1127   02/28/12 1000  vancomycin (VANCOCIN) 500 mg in sodium chloride irrigation 0.9 % 100 mL ENEMA  Status:  Discontinued     500 mg Rectal 2 times daily 02/28/12 0923 02/28/12 1504   02/27/12 1600  metroNIDAZOLE (FLAGYL) IVPB 500 mg  Status:  Discontinued     500 mg 100 mL/hr over 60 Minutes Intravenous Every 8 hours 02/27/12 1546 02/27/12 2000   02/27/12 1600  ciprofloxacin (CIPRO) IVPB 400 mg  Status:  Discontinued     400 mg 200 mL/hr over 60 Minutes Intravenous Every 12 hours 02/27/12 1547 02/27/12 2000   02/27/12 1545  metroNIDAZOLE (FLAGYL) IVPB 500 mg  Status:  Discontinued     500 mg 100 mL/hr over 60 Minutes Intravenous  Once  02/27/12 1535 02/28/12 0843   02/27/12 1545  vancomycin (VANCOCIN) 500 mg in sodium chloride 0.9 % 100 mL IVPB  Status:  Discontinued     500 mg 100 mL/hr over 60 Minutes Intravenous  Once 02/27/12 1535 02/27/12 1538   02/27/12 1545  ciprofloxacin (CIPRO) IVPB 400 mg  Status:  Discontinued     400 mg 200 mL/hr over 60 Minutes Intravenous  Once 02/27/12 1538 02/28/12 0843   02/27/12 1415  vancomycin (VANCOCIN) 500 mg in sodium chloride 0.9 % 100 mL IVPB     500 mg  100 mL/hr over 60 Minutes Intravenous  Once 02/27/12 1402 02/27/12 1411   02/27/12 1400  vancomycin (VANCOCIN) powder 500 mg  Status:  Discontinued     500 mg Other To Surgery 02/27/12 1359 02/27/12 1402   02/27/12 1200  metroNIDAZOLE (FLAGYL) IVPB 500 mg  Status:  Discontinued    Comments:  First dose ASAP   500 mg 100 mL/hr over 60 Minutes Intravenous Every 6 hours 02/27/12 0953 03/05/12 1346   02/27/12 1200  vancomycin (VANCOCIN) 50 mg/mL oral solution 250 mg  Status:  Discontinued    Comments:  First dose ASAP   250 mg Oral 4 times per day 02/27/12 0953 02/28/12 0814   02/26/12 1700  oseltamivir (TAMIFLU) capsule 75 mg     75 mg Oral  Once 02/26/12 1651 02/26/12 1715      Assessment/Plan: s/p Procedure(s): TOTAL COLECTOMY ILEOSTOMY Weaning towards extubation is fine.  His Renal function is slightly worse.  Still on pressors.  Would like to resume tube feeidngs after extubation pending swallowing evaluation. Passive bile bag drainage of Blake tubes.  LOS: 14 days   Kathryne Eriksson. Dahlia Bailiff, MD, FACS [phone] [facility] Surgery 03/11/2012

## 2012-03-12 ENCOUNTER — Inpatient Hospital Stay (HOSPITAL_COMMUNITY): Payer: BC Managed Care – PPO

## 2012-03-12 LAB — HEPARIN LEVEL (UNFRACTIONATED)
Heparin Unfractionated: 0.53 [IU]/mL (ref 0.30–0.70)
Heparin Unfractionated: 0.47 IU/mL (ref 0.30–0.70)

## 2012-03-12 LAB — COMPREHENSIVE METABOLIC PANEL WITH GFR
ALT: 61 U/L — ABNORMAL HIGH (ref 0–53)
AST: 117 U/L — ABNORMAL HIGH (ref 0–37)
Albumin: 1.4 g/dL — ABNORMAL LOW (ref 3.5–5.2)
Alkaline Phosphatase: 639 U/L — ABNORMAL HIGH (ref 39–117)
BUN: 158 mg/dL — ABNORMAL HIGH (ref 6–23)
CO2: 17 meq/L — ABNORMAL LOW (ref 19–32)
Calcium: 7 mg/dL — ABNORMAL LOW (ref 8.4–10.5)
Chloride: 99 meq/L (ref 96–112)
Creatinine, Ser: 2.74 mg/dL — ABNORMAL HIGH (ref 0.50–1.35)
GFR calc Af Amer: 29 mL/min — ABNORMAL LOW
GFR calc non Af Amer: 25 mL/min — ABNORMAL LOW
Glucose, Bld: 143 mg/dL — ABNORMAL HIGH (ref 70–99)
Potassium: 5.8 meq/L — ABNORMAL HIGH (ref 3.5–5.1)
Sodium: 131 meq/L — ABNORMAL LOW (ref 135–145)
Total Bilirubin: 7 mg/dL — ABNORMAL HIGH (ref 0.3–1.2)
Total Protein: 4.4 g/dL — ABNORMAL LOW (ref 6.0–8.3)

## 2012-03-12 LAB — RENAL FUNCTION PANEL
Albumin: 1.5 g/dL — ABNORMAL LOW (ref 3.5–5.2)
BUN: 148 mg/dL — ABNORMAL HIGH (ref 6–23)
CO2: 22 mEq/L (ref 19–32)
Chloride: 98 mEq/L (ref 96–112)
Creatinine, Ser: 2.54 mg/dL — ABNORMAL HIGH (ref 0.50–1.35)
Glucose, Bld: 139 mg/dL — ABNORMAL HIGH (ref 70–99)
Potassium: 5.1 mEq/L (ref 3.5–5.1)

## 2012-03-12 LAB — GLUCOSE, CAPILLARY
Glucose-Capillary: 143 mg/dL — ABNORMAL HIGH (ref 70–99)
Glucose-Capillary: 155 mg/dL — ABNORMAL HIGH (ref 70–99)
Glucose-Capillary: 198 mg/dL — ABNORMAL HIGH (ref 70–99)

## 2012-03-12 LAB — CBC
HCT: 49.9 % (ref 39.0–52.0)
MCH: 32.1 pg (ref 26.0–34.0)
MCHC: 32.9 g/dL (ref 30.0–36.0)
MCV: 97.7 fL (ref 78.0–100.0)
RDW: 19.9 % — ABNORMAL HIGH (ref 11.5–15.5)
WBC: 39.4 10*3/uL — ABNORMAL HIGH (ref 4.0–10.5)

## 2012-03-12 LAB — PROTIME-INR
INR: 1.72 — ABNORMAL HIGH (ref 0.00–1.49)
Prothrombin Time: 19.6 seconds — ABNORMAL HIGH (ref 11.6–15.2)

## 2012-03-12 MED ORDER — IMIPENEM-CILASTATIN 250 MG IV SOLR
250.0000 mg | Freq: Four times a day (QID) | INTRAVENOUS | Status: DC
  Administered 2012-03-12 – 2012-03-19 (×28): 250 mg via INTRAVENOUS
  Filled 2012-03-12 (×30): qty 250

## 2012-03-12 MED ORDER — VASOPRESSIN 20 UNIT/ML IJ SOLN
0.0300 [IU]/min | INTRAVENOUS | Status: DC
  Administered 2012-03-12 – 2012-03-14 (×3): 0.03 [IU]/min via INTRAVENOUS
  Filled 2012-03-12 (×4): qty 2.5

## 2012-03-12 MED ORDER — SODIUM CHLORIDE 0.9 % IV SOLN
500.0000 mg | Freq: Three times a day (TID) | INTRAVENOUS | Status: DC
  Filled 2012-03-12 (×2): qty 500

## 2012-03-12 MED ORDER — STERILE WATER FOR INJECTION IV SOLN
INTRAVENOUS | Status: DC
  Administered 2012-03-12 – 2012-03-13 (×7): via INTRAVENOUS_CENTRAL
  Filled 2012-03-12 (×11): qty 150

## 2012-03-12 MED ORDER — ETOMIDATE 2 MG/ML IV SOLN
INTRAVENOUS | Status: AC
  Administered 2012-03-12: 10 mg
  Filled 2012-03-12: qty 10

## 2012-03-12 MED ORDER — PRISMASOL BGK 4/2.5 32-4-2.5 MEQ/L IV SOLN
INTRAVENOUS | Status: DC
  Administered 2012-03-12 – 2012-03-14 (×3): via INTRAVENOUS_CENTRAL
  Filled 2012-03-12 (×4): qty 5000

## 2012-03-12 MED ORDER — HEPARIN (PORCINE) IN NACL 100-0.45 UNIT/ML-% IJ SOLN
800.0000 [IU]/h | INTRAMUSCULAR | Status: DC
  Administered 2012-03-13: 800 [IU]/h via INTRAVENOUS
  Filled 2012-03-12 (×3): qty 250

## 2012-03-12 MED ORDER — HEPARIN SODIUM (PORCINE) 1000 UNIT/ML IJ SOLN
INTRAMUSCULAR | Status: AC
  Administered 2012-03-12: 2400 [IU]
  Filled 2012-03-12: qty 3

## 2012-03-12 MED ORDER — HYDROCORTISONE SOD SUCCINATE 100 MG IJ SOLR
50.0000 mg | Freq: Four times a day (QID) | INTRAMUSCULAR | Status: DC
  Administered 2012-03-12 – 2012-03-16 (×16): 50 mg via INTRAVENOUS
  Administered 2012-03-16: 100 mg via INTRAVENOUS
  Administered 2012-03-16 – 2012-03-17 (×3): 50 mg via INTRAVENOUS
  Filled 2012-03-12 (×24): qty 1

## 2012-03-12 MED ORDER — VANCOMYCIN HCL IN DEXTROSE 1-5 GM/200ML-% IV SOLN
1000.0000 mg | INTRAVENOUS | Status: DC
  Administered 2012-03-12 – 2012-03-16 (×5): 1000 mg via INTRAVENOUS
  Filled 2012-03-12 (×6): qty 200

## 2012-03-12 MED ORDER — PRISMASOL BGK 4/2.5 32-4-2.5 MEQ/L IV SOLN
INTRAVENOUS | Status: DC
  Administered 2012-03-12 – 2012-03-15 (×19): via INTRAVENOUS_CENTRAL
  Filled 2012-03-12 (×26): qty 5000

## 2012-03-12 MED ORDER — SODIUM POLYSTYRENE SULFONATE 15 GM/60ML PO SUSP
15.0000 g | Freq: Once | ORAL | Status: AC
  Administered 2012-03-12: 15 g
  Filled 2012-03-12: qty 60

## 2012-03-12 NOTE — Progress Notes (Signed)
Hyperkalemia   Kayexalate ordered

## 2012-03-12 NOTE — Consult Note (Signed)
Reason for Consult:ARF, hyperkalemia Referring Physician: Titus Mould, MD  Connor Small is an 52 y.o. male.  HPI: Pt is a 52yo WM with PMH sig for ulcerative colitis who was admitted at [facility] for Cdiff on 2/2. He underwent decompressive cecostomy 02/27/12, then worsened on 2/4 with increased abdominal distension and tenderness, worsening renal function. He was taken back to the OR on 2/4 for total abdominal colectomy and ileostomy. Post op he developed ARDS and SIRS.  The morning of 2/6 he developed new onset of A-fib and transferred to [facility] for further management.  We were asked to see the patient due to progressive renal failure, persistent hyperkalemia, metabolic acidosis, anasarca, and pleural effusion.  His trend in creatinine is seen below.      Trend in Creatinine: Creatinine, Ser  Date/Time Value Range Status  03/12/2012  4:55 AM 2.74* 0.50 - 1.35 mg/dL Final  03/11/2012  6:20 AM 2.35* 0.50 - 1.35 mg/dL Final  03/10/2012  5:10 PM 2.26* 0.50 - 1.35 mg/dL Final  03/10/2012  5:34 AM 2.04* 0.50 - 1.35 mg/dL Final  03/09/2012  5:55 PM 1.81* 0.50 - 1.35 mg/dL Final  03/09/2012  5:30 AM 1.70* 0.50 - 1.35 mg/dL Final  03/08/2012  4:15 PM 1.67* 0.50 - 1.35 mg/dL Final  03/08/2012  4:05 AM 1.52* 0.50 - 1.35 mg/dL Final  03/07/2012  5:33 PM 1.62* 0.50 - 1.35 mg/dL Final  03/07/2012  4:53 AM 1.43* 0.50 - 1.35 mg/dL Final  03/06/2012  9:00 PM 1.38* 0.50 - 1.35 mg/dL Final  03/06/2012  4:49 AM 1.42* 0.50 - 1.35 mg/dL Final  03/05/2012 11:20 PM 1.42* 0.50 - 1.35 mg/dL Final  03/05/2012  1:00 PM 1.54* 0.50 - 1.35 mg/dL Final  03/05/2012  4:30 AM 1.45* 0.50 - 1.35 mg/dL Final  03/04/2012  5:04 AM 1.40* 0.50 - 1.35 mg/dL Final  03/03/2012  4:21 AM 1.24  0.50 - 1.35 mg/dL Final  03/02/2012  5:00 AM 1.23  0.50 - 1.35 mg/dL Final  03/02/2012 12:42 AM 1.21  0.50 - 1.35 mg/dL Final  03/01/2012  5:14 AM 1.38* 0.50 - 1.35 mg/dL Final  02/29/2012  4:39 AM 1.78* 0.50 - 1.35 mg/dL Final  02/28/2012 11:52 PM 1.84* 0.50 - 1.35 mg/dL Final   02/28/2012  7:24 PM 1.90* 0.50 - 1.35 mg/dL Final  02/28/2012 11:43 AM 2.12* 0.50 - 1.35 mg/dL Final  02/28/2012  4:33 AM 1.83* 0.50 - 1.35 mg/dL Final     DELTA CHECK NOTED  02/27/2012  5:00 AM 1.00  0.50 - 1.35 mg/dL Final  02/26/2012  2:09 PM 1.15  0.50 - 1.35 mg/dL Final  11/06/2010 11:38 AM 0.82  0.50 - 1.35 mg/dL Final    PMH:   Past Medical History  Diagnosis Date  . Chronic diarrhea   . Rectal bleed   . Hemorrhoids   . Ulcerative colitis     Distal UC over 8 yrs ago diagnosed  . Diverticulitis of large intestine with perforation 10/2011    done at [place]    [place]:   Past Surgical History  Procedure Laterality Date  . Colonoscopy  9/03  . Sigmoidoscopy       06/13/2002  . Temporary ostomy November 06, 2010      for a colon perforation that was done in [facility] (Dr Payton Doughty).    . Colonoscopy  02/16/2011    Procedure: COLONOSCOPY;  Surgeon: Rogene Houston, MD;  Location: [facility] END[facility];  Service: Endoscopy;  Laterality: N/A;  100  .  Flexible sigmoidoscopy  02/27/2012    Procedure: FLEXIBLE SIGMOIDOSCOPY;  Surgeon: Rogene Houston, MD;  Location: [facility] ENDO SU[facility];  Service: Endoscopy;  Laterality: N/A;  with colonic decompression  . Laparotomy  02/27/2012    Procedure: EXPLORATORY LAPAROTOMY;  Surgeon: Donato Heinz, MD;  Location: [facility] ORS;  Service: General;  Laterality: N/A;  . Cecostomy  02/27/2012    Procedure: CECOSTOMY;  Surgeon: Donato Heinz, MD;  Location: [facility] ORS;  Service: General;  Laterality: N/A;  Cecostomy Tube Placement  . Colectomy  02/28/2012    Procedure: TOTAL COLECTOMY;  Surgeon: Donato Heinz, MD;  Location: [facility] ORS;  Service: General;  Laterality: N/A;  . Ileostomy  02/28/2012    Procedure: ILEOSTOMY;  Surgeon: Donato Heinz, MD;  Location: [facility] ORS;  Service: General;  Laterality: N/A;    Allergies:  Allergies  Allergen Reactions  . Penicillins Other (See Comments)    Heart rate changes    Medications:   Prior to Admission medications   Medication Sig  Start Date End Date Taking? Authorizing Provider  bifidobacterium infantis (ALIGN) capsule Take 1 capsule by mouth daily. 07/18/11 07/17/12 Yes Rogene Houston, MD  FORTESTA 10 MG/ACT (2%) GEL at bedtime. Patient states that he applies it to his leg 12/05/11  Yes Historical Provider, MD  ibuprofen (ADVIL,MOTRIN) 200 MG tablet Take 400 mg by mouth 2 (two) times daily as needed. For pain   Yes Historical Provider, MD  Mesalamine (ASACOL HD) 800 MG TBEC Take 2 tablets (1,600 mg total) by mouth 2 (two) times daily. 01/16/12  Yes Rogene Houston, MD  Pseudoeph-Doxylamine-DM-APAP (NYQUIL D COLD/FLU) 60-12.06-22-998 MG/30ML LIQD Take 30 mLs by mouth at bedtime. Cold/flu symptoms   Yes Historical Provider, MD  Pseudoephedrine-APAP-DM (DAYQUIL MULTI-SYMPTOM COLD/FLU PO) Take 1 capsule by mouth every 4 (four) hours as needed. Cold symptoms   Yes Historical Provider, MD    Inpatient medications: . antiseptic oral rinse  15 mL Mouth Rinse QID  . chlorhexidine  15 mL Mouth Rinse BID  . famotidine  20 mg Per Tube BID  . feeding supplement  60 mL Per Tube TID  . feeding supplement (VITAL AF 1.2 CAL)  1,000 mL Per Tube Q24H  . heparin      . hydrocortisone sodium succinate  50 mg Intravenous Q6H  . imipenem-cilastatin  250 mg Intravenous Q6H  . insulin aspart  0-20 Units Subcutaneous Q4H  . insulin glargine  30 Units Subcutaneous Daily  . metronidazole  500 mg Intravenous Q8H  . multivitamin  5 mL Per Tube Daily  . sodium chloride  10-40 mL Intracatheter Q12H  . vancomycin  500 mg Oral Q6H  . vancomycin  1,000 mg Intravenous Q24H    Discontinued Meds:   Medications Discontinued During This Encounter  Medication Reason  . 0.9 %  sodium chloride infusion   . ondansetron (ZOFRAN) injection 4 mg   . Mesalamine TBEC 1,600 mg Formulary change  . enoxaparin (LOVENOX) injection 40 mg   . ibuprofen (ADVIL,MOTRIN) tablet 400 mg   . HYDROcodone-acetaminophen (NORCO/VICODIN) 5-325 MG per tablet 1-2 tablet   .  alum & mag hydroxide-simeth (MAALOX/MYLANTA) 200-200-20 MG/5ML suspension 30 mL   . traZODone (DESYREL) tablet 50 mg   . HYDROmorphone (DILAUDID) injection 0.5 mg   . Mesalamine (ASACOL) DR capsule 1,600 mg   . vancomycin (VANCOCIN) powder 500 mg Entry Error  . simethicone susp in sterile water 1000 mL irrigation Patient Discharge  . meperidine (DEMEROL) injection Patient Discharge  .  midazolam (VERSED) 5 MG/5ML injection Patient Discharge  . vancomycin (VANCOCIN) 500 mg in sodium chloride 0.9 % 100 mL IVPB   . sodium chloride irrigation 0.9 % Patient Discharge  . 0.9 %  sodium chloride infusion   . acetaminophen (TYLENOL) tablet 650 mg   . acetaminophen (TYLENOL) suppository 650 mg   . ciprofloxacin (CIPRO) IVPB 400 mg   . lactated ringers infusion   . lactated ringers infusion   . metroNIDAZOLE (FLAGYL) IVPB 500 mg   . mupirocin ointment (BACTROBAN) 2 %   . vancomycin (VANCOCIN) 50 mg/mL oral solution 250 mg   . pantoprazole (PROTONIX) injection 40 mg   . metroNIDAZOLE (FLAGYL) IVPB 500 mg Completed Course  . ciprofloxacin (CIPRO) IVPB 400 mg Completed Course  . 0.9 % NaCl with KCl 20 mEq/ L  infusion   . NON FORMULARY 500 mg Entry Error  . dextrose 50 % solution Returned to ADS  . vancomycin (VANCOCIN) 500 mg in sodium chloride irrigation 0.9 % 100 mL ENEMA Entry Error  . 0.9 % irrigation (POUR BTL) Patient Discharge  . lactated ringers infusion   . midazolam (VERSED) injection 1-2 mg   . phenol (CHLORASEPTIC) mouth spray 1 spray   . sodium chloride 0.9 % bolus 1,000 mL   . vancomycin (VANCOCIN) 50 mg/mL oral solution 500 mg   . vancomycin (VANCOCIN) 500 mg in sodium chloride irrigation 0.9 % 60 mL ENEMA   . 0.9 %  sodium chloride infusion   . pantoprazole (PROTONIX) injection 40 mg Duplicate  . 0.9 %  sodium chloride infusion   . dextrose 50 % solution Returned to ADS  . albumin human 5 % solution 12.5 g Formulary change  . phenylephrine (NEO-SYNEPHRINE) 10,000 mcg in  dextrose 5 % 330 mL infusion Duplicate  . phenylephrine (NEO-SYNEPHRINE) 10,000 mcg in dextrose 5 % 076 mL infusion Duplicate  . albuterol (PROVENTIL) (5 MG/ML) 0.5% nebulizer solution 2.5 mg   . HYDROmorphone (DILAUDID) injection 0.5-1 mg   . sodium bicarbonate 150 mEq in dextrose 5 % 1,000 mL infusion   . levalbuterol (XOPENEX) nebulizer solution 0.63 mg   . ipratropium (ATROVENT) nebulizer solution 0.5 mg   . sodium bicarbonate 150 mEq in dextrose 5 % 1,000 mL infusion   . dextrose 5 % and 0.45 % NaCl with KCl 10 mEq/L infusion Change in therapy  . vancomycin (VANCOCIN) IVPB 1000 mg/200 mL premix   . fentaNYL (SUBLIMAZE) injection 25-50 mcg   . dextrose 5 % and 0.45 % NaCl with KCl 20 mEq/L infusion   . midazolam (VERSED) injection 2-4 mg   . albumin human 25 % solution 25 g   . morphine 4 MG/ML injection 4 mg   . norepinephrine (LEVOPHED) 4 mg in dextrose 5 % 250 mL infusion   . hydrocortisone sodium succinate (SOLU-CORTEF) 100 mg/2 mL injection 100 mg   . metoprolol (LOPRESSOR) injection 5 mg   . DOBUTamine (DOBUTREX) infusion 4000 mcg/mL   . chlorhexidine (PERIDEX) 0.12 % solution 15 mL Duplicate  . antiseptic oral rinse (BIOTENE) solution 15 mL Duplicate  . fentaNYL (SUBLIMAZE) bolus via infusion 25-100 mcg   . fentaNYL (SUBLIMAZE) 10 mcg/mL in sodium chloride 0.9 % 250 mL infusion   . fentaNYL (SUBLIMAZE) bolus via infusion 25-100 mcg   . amiodarone (NEXTERONE) IV bolus only 150 mg/100 mL Entry Error  . vancomycin (VANCOCIN) 50 mg/mL oral solution 500 mg   . pantoprazole (PROTONIX) injection 40 mg   . vasopressin (PITRESSIN) 50 Units in sodium  chloride 0.9 % 250 mL infusion   . vancomycin (VANCOCIN) 1,250 mg in sodium chloride 0.9 % 250 mL IVPB   . aztreonam (AZACTAM) 1 g in dextrose 5 % 50 mL IVPB   . ondansetron (ZOFRAN) tablet 4 mg   . sodium chloride 0.9 % bolus 500 mL   . ipratropium (ATROVENT) nebulizer solution 0.5 mg   . levalbuterol (XOPENEX) nebulizer solution 0.63  mg   . insulin glargine (LANTUS) injection 15 Units Change in therapy  . TPN (CLINIMIX) +/- additives   . hydrocortisone sodium succinate (SOLU-CORTEF) 100 mg/2 mL injection 50 mg   . TPN (CLINIMIX) +/- additives   . hydrocortisone sodium succinate (SOLU-CORTEF) 100 mg/2 mL injection 50 mg   . sodium chloride 0.9 % injection 3 mL   . phenylephrine (NEO-SYNEPHRINE) 40,000 mcg in dextrose 5 % 250 mL infusion   . hydrocortisone sodium succinate (SOLU-CORTEF) 100 mg/2 mL injection 50 mg   . metroNIDAZOLE (FLAGYL) IVPB 500 mg   . hydrocortisone sodium succinate (SOLU-CORTEF) 100 mg/2 mL injection 25 mg   . amiodarone (NEXTERONE PREMIX) 360 mg/200 mL dextrose IV infusion   . amiodarone (NEXTERONE) 1.8 mg/mL load via infusion 150 mg   . amiodarone (NEXTERONE PREMIX) 360 mg/200 mL dextrose IV infusion   . calcium gluconate 2 g in sodium chloride 0.9 % 100 mL IVPB Entry Error  . sodium bicarbonate 150 mEq in dextrose 5 % 1,000 mL infusion   . insulin glargine (LANTUS) injection 10 Units   . levalbuterol (XOPENEX) nebulizer solution 0.63 mg   . sodium bicarbonate 150 mEq in sterile water 1,000 mL infusion   . heparin ADULT infusion 100 units/mL (25000 units/250 mL)   . insulin glargine (LANTUS) injection 15 Units   . heparin ADULT infusion 100 units/mL (25000 units/250 mL)   . famotidine (PEPCID) IVPB 20 mg   . phenylephrine (NEO-SYNEPHRINE) 10,000 mcg in dextrose 5 % 250 mL infusion   . heparin ADULT infusion 100 units/mL (25000 units/250 mL)   . furosemide (LASIX) injection 40 mg   . insulin glargine (LANTUS) injection 20 Units   . midazolam (VERSED) 1 mg/mL in sodium chloride 0.9 % 50 mL infusion   . midazolam (VERSED) bolus via infusion 1-2 mg   . fentaNYL (SUBLIMAZE) 10 mcg/mL in sodium chloride 0.9 % 250 mL infusion   . feeding supplement (VITAL AF 1.2 CAL) liquid 1,000 mL   . furosemide (LASIX) 250 mg in dextrose 5 % 250 mL infusion   . heparin ADULT infusion 100 units/mL (25000  units/250 mL)   . heparin ADULT infusion 100 units/mL (25000 units/250 mL)   . vasopressin (PITRESSIN) 50 Units in sodium chloride 0.9 % 250 mL infusion   . hydrocortisone sodium succinate (SOLU-CORTEF) 100 mg/2 mL injection 50 mg   . insulin glargine (LANTUS) injection 25 Units   . phenylephrine (NEO-SYNEPHRINE) 40,000 mcg in dextrose 5 % 250 mL infusion   . norepinephrine (LEVOPHED) 4 mg in dextrose 5 % 250 mL infusion   . famotidine (PEPCID) IVPB 20 mg Change in therapy  . heparin ADULT infusion 100 units/mL (25000 units/250 mL)   . aztreonam (AZACTAM) 1 g in dextrose 5 % 50 mL IVPB Completed Course  . vancomycin (VANCOCIN) 750 mg in sodium chloride 0.9 % 150 mL IVPB   . hydrocortisone sodium succinate (SOLU-CORTEF) 100 mg/2 mL injection 50 mg   . imipenem-cilastatin (PRIMAXIN) 500 mg in sodium chloride 0.9 % 100 mL IVPB     Social History:  reports  that he quit smoking about 27 years ago. His smoking use included Cigarettes. He smoked 0.00 packs per day. He has never used smokeless tobacco. He reports that he does not drink alcohol or use illicit drugs.  Family History:   Family History  Problem Relation Age of Onset  . Cancer Sister   . Healthy Daughter   . Hypertension Mother     Review of systems not obtained due to patient factors. Weight change: -2.1 kg (-4 lb 10.1 oz)  Intake/Output Summary (Last 24 hours) at 03/12/12 0951 Last data filed at 03/12/12 0700  Gross per 24 hour  Intake 1403.22 ml  Output   3805 ml  Net -2401.78 ml    General appearance: intubated/sedated Head: Normocephalic, without obvious abnormality, atraumatic Resp: rales bibasilar Cardio: no rub and tachycardic GI: hypoactive bowel sounds, ostomy at RLQ, surgical incision in LUQ Extremities: edema 3+ anasarca Neurologic: Mental status: intubated, sedated  Labs: Basic Metabolic Panel:  Recent Labs Lab 03/06/12 0449  03/07/12 0453  03/08/12 0405 03/08/12 1615 03/09/12 0530 03/09/12 1755  03/10/12 0534 03/10/12 1710 03/11/12 0620 03/12/12 0455  NA 136  < > 135  < > 135 136 131* 131* 134* 131* 133* 131*  K 5.7*  < > 5.1  < > 5.0 4.6 4.3 4.0 4.8 5.3* 5.0 5.8*  CL 109  < > 106  < > 101 104 101 102 102 106 102 99  CO2 23  < > 24  < > 25 24 23 21 20 22 19  17*  GLUCOSE 243*  < > 231*  < > 171* 235* 234* 250* 184* 238* 243* 143*  BUN 71*  < > 71*  < > 84* 91* 91* 97* 104* 115* 130* 158*  CREATININE 1.42*  < > 1.43*  < > 1.52* 1.67* 1.70* 1.81* 2.04* 2.26* 2.35* 2.74*  ALBUMIN 0.9*  --  1.0*  --  1.1*  --   --   --  1.1*  --  1.3* 1.4*  CALCIUM 6.2*  < > 6.5*  < > 6.6* 6.3* 6.7* 6.6* 6.8* 5.7* 7.2* 7.0*  PHOS  --   --   --   --  3.8  --  4.1  --   --  5.6* 5.6*  --   < > = values in this interval not displayed. Liver Function Tests:  Recent Labs Lab 03/10/12 0534 03/11/12 0620 03/12/12 0455  AST 117* 129* 117*  ALT 41 55* 61*  ALKPHOS 693* 710* 639*  BILITOT 2.3* 3.9* 7.0*  PROT 4.0* 4.1* 4.4*  ALBUMIN 1.1* 1.3* 1.4*   No results found for this basename: LIPASE, AMYLASE,  in the last 168 hours No results found for this basename: AMMONIA,  in the last 168 hours CBC:  Recent Labs Lab 03/06/12 0449 03/07/12 0453  03/09/12 0530 03/10/12 0534 03/11/12 0620 03/12/12 0455  WBC 25.1* 26.7*  < > 42.9* 42.2* 41.2* 39.4*  NEUTROABS 21.8* 24.0*  --  37.8* 34.2*  --   --   HGB 13.3 14.7  < > 14.1 15.3 15.3 16.4  HCT 40.2 43.1  < > 42.4 43.7 45.3 49.9  MCV 95.0 94.7  < > 94.2 94.8 96.6 97.7  PLT 107* 114*  < > 284 317 289 232  < > = values in this interval not displayed. PT/INR: @labrcntip (inr:5) Cardiac Enzymes: No results found for this basename: CKTOTAL, CKMB, CKMBINDEX, TROPONINI,  in the last 168 hours CBG:  Recent Labs Lab 03/11/12 1618 03/11/12 2017  03/12/12 0055 03/12/12 0436 03/12/12 0735  GLUCAP 198* 180* 155* 151* 153*    Iron Studies: No results found for this basename: IRON, TIBC, TRANSFERRIN, FERRITIN,  in the last 168 hours  Xrays/Other  Studies: Ct Abdomen Pelvis Wo Contrast  03/11/2012  *RADIOLOGY REPORT*  Clinical Data: Postop colectomy.  Elevated white count and LFTs.  CT ABDOMEN AND PELVIS WITHOUT CONTRAST  Technique:  Multidetector CT imaging of the abdomen and pelvis was performed following the standard protocol without intravenous contrast.  Comparison: 03/05/2012  Findings: Moderate right and small left pleural effusions.  Heart is normal size.  Compressive atelectasis in the lower lobes.  Open midline incision.  Moderate amount of ascites around the spleen.  Changes of subtotal colectomy.  Right lower quadrant ileostomy noted.  Liver, spleen, pancreas, adrenals and kidneys are unremarkable.  Surgical drains within the pelvis.  Small bowel is decompressed and grossly unremarkable. There appears to be mild wall thickening involving much of the stomach, but the stomach is decompressed and difficult to evaluate.  Small hyperdense area noted along the greater curvature of the stomach posteriorly.  This could represent a small mesenteric hematoma.  No focal fluid collections are seen to suggest abscess.  Foley catheters present in the bladder which is grossly unremarkable.  Aorta is normal caliber.  No acute bony abnormality.  IMPRESSION: Changes of colectomy.  Moderate fluid noted adjacent to the spleen in the left upper quadrant.  This presumably represents loculated ascites rather than subcapsular fluid collection.  Apparent diffuse gastric wall thickening, but the stomach is decompressed and difficult to evaluate.  Small hyperdense rounded collection posterior to the greater curvature of the stomach, possibly small adjacent postoperative hematoma.  Moderate right pleural effusion and small left pleural effusion.   Original Report Authenticated By: Rolm Baptise, M.D.    Ct Head Wo Contrast  03/11/2012  *RADIOLOGY REPORT*  Clinical Data: Decreased right side movement.  CT HEAD WITHOUT CONTRAST  Technique:  Contiguous axial images were  obtained from the base of the skull through the vertex without contrast.  Comparison: 03/05/2012  Findings: No acute intracranial abnormality.  Specifically, no hemorrhage, hydrocephalus, mass lesion, acute infarction, or significant intracranial injury.  No acute calvarial abnormality. Old right cerebellar infarct.  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  Stable small amount of fluid in the mastoid air cells bilaterally. Paranasal sinuses are clear.  IMPRESSION: Stable exam.  No acute intracranial abnormality.   Original Report Authenticated By: Rolm Baptise, M.D.    Dg Chest Port 1 View  03/12/2012  *RADIOLOGY REPORT*  Clinical Data: Intubated patient.  PORTABLE CHEST - 1 VIEW  Comparison: 03/11/2012  Findings: Endotracheal tube is 2.5 cm above the carina. Nasogastric tube extends into the abdomen.  Hazy densities at the right lung base could represent atelectasis and possibly pleural fluid.  There is left basilar atelectasis.  Heart size is normal. Left jugular central line in the upper SVC region.  IMPRESSION: Increased densities at the right lung base are concerning for atelectasis and possibly pleural fluid.  Left basilar atelectasis.   Original Report Authenticated By: Markus Daft, M.D.    Dg Chest Port 1 View  03/11/2012  *RADIOLOGY REPORT*  Clinical Data: Intubated.  PORTABLE CHEST - 1 VIEW  Comparison: 03/10/2012  Findings: Support devices are stable.  Bibasilar opacities, likely atelectasis.  Suspect small effusions.  Heart is normal size.  No acute bony abnormality.  IMPRESSION: Small bilateral effusions and bibasilar atelectasis, similar to prior  study.   Original Report Authenticated By: Rolm Baptise, M.D.      Assessment/Plan: 1. ARF- nonoliguric.  Likely ischemic ATN in light of intravascular volume depletion, hypotension, and ongoing pressors.  Pt with hypotension, anasarca, pressor support with worsening BUN/Creat/K/acidosis.  Will plan to initiate CVVHD for UF and  management of electrolyte abnormalities.  Hopefully will be able to improve pleural effusion and facilitate extubation as he has been on ventillator since Feb 2nd.  Discussed case with Dr. Titus Mould and Mrs. Clipper.  All are in agreement to proceed with CRRT 2. VDRF- per PCCM 3. C diff/s/p colectomy/iliostomy- per primary svc 4. SIRS- on pressors per PCCM 5. Hyperkalemia- as above 6. Metabolic acidosis- as above 7. Hyponatremia- due to anasarca/protein malnutrition. Plan as above 8. Abnormal LFT's- ?shock liver 9. Leukocytosis- on abx. 10. Dispo- cont with aggressive measures for now.   Daiwik Buffalo A 03/12/2012, 9:51 AM

## 2012-03-12 NOTE — Progress Notes (Addendum)
ANTICOAGULATION / ANTIBIOTIC CONSULT NOTE - Follow Up Consult  Pharmacy Consult:  Heparin / Vancomycin + Primaxin Indication:  +DVT / septic shock  Allergies  Allergen Reactions  . Penicillins Other (See Comments)    Heart rate changes    Patient Measurements: Height: 5' 9"  (175.3 cm) Weight: 206 lb 9.1 oz (93.7 kg) IBW/kg (Calculated) : 70.7 Heparin Dosing Weight: 95kg  Vital Signs: Temp: 99.9 F (37.7 C) (02/17 0400) Temp src: Oral (02/17 0400) BP: 83/57 mmHg (02/17 0700) Pulse Rate: 102 (02/17 0700)  Labs:  Recent Labs  03/10/12 0534  03/10/12 1710 03/11/12 0620 03/11/12 1551 03/11/12 2328 03/12/12 0455  HGB 15.3  --   --  15.3  --   --  16.4  HCT 43.7  --   --  45.3  --   --  49.9  PLT 317  --   --  289  --   --  232  HEPARINUNFRC  --   < > <0.10* 1.41* 0.72* 0.36 0.53  CREATININE 2.04*  --  2.26* 2.35*  --   --  2.74*  < > = values in this interval not displayed.  Estimated Creatinine Clearance: 35.2 ml/min (by C-G formula based on Cr of 2.74).      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  Patient continues on vancomycin as empiric therapy for septic shock.  Azactam to change to Primaxin for broader coverage.  His renal function is worsening but his UOP remains good.  Patient may start on CRRT.  He continues on PO vancomycin and IV Flagyl for C.diff.  2/3: Cipro>> 2/4 2/3: Flagyl IV>> 2/2: Tamiflu>> 2/2 2/3: Vanc>> 2/7, resumed 2/16>> 2/6: PO Vanc>> 2/6: Aztreonam>> 2/7, resumed 2/16 >> 2/17 Primaxin 2/17 >>  2/4 Blood: negative 2/3 Blood: negative 2/4 Urine: negative 2/9 Pleural fluid: negative 2/14 UA - negative 2/15 blood - NGTD  Patient remains on IV heparin for +DVT.  Heparin level slightly supra-therapeutic; no bleeding reported.   Goal of Therapy:  Heparin level 0.3-0.5 units/ml Monitor platelets by anticoagulation protocol: Yes Vanc trough 15-20 mcg/mL   Plan:   - Decrease heparin gtt slightly to 800 units/hr - Daily HL / CBC - Change vanc to 106m IV Q24H - Primaxin 2555mIV Q6H - Continue PO vanc and IV Flagyl as ordered - Monitor renal fxn, clinical course, plan for CRRT    Connor Stripling D. DaMina MarblePharmD, BCPS Pager:  [phone]/17/2014, 8:52 AM

## 2012-03-12 NOTE — Evaluation (Signed)
Physical Therapy Evaluation Patient Details Name: Connor Small MRN: [id number] DOB: [birth year] Today's Date: 03/12/2012 Time: 4034-7425 PT Time Calculation (min): 10 min  PT Assessment / Plan / Recommendation Clinical Impression  Pt s/p ulcerative colitis with total colectomy, sepsis and VDRF.  Pt very lethargic today and only opened eyes to command.  Unsure if pt will benefit from PT but will give pt a trial of PT to assess If pt improves over next few days.  Spoke with OT and let them know that they should wait and evaluate pt at a later date as pt not responsive today.       PT Assessment  Patient needs continued PT services    Follow Up Recommendations  SNF;Supervision/Assistance - 24 hour                Equipment Recommendations  Other (comment) (TBA)         Frequency Min 1X/week    Precautions / Restrictions Precautions Precautions: Fall Restrictions Weight Bearing Restrictions: No   Pertinent Vitals/Pain VSS, No pain      Mobility  Bed Mobility Bed Mobility: Not assessed Transfers Transfers: Not assessed Ambulation/Gait Ambulation/Gait Assistance: Not tested (comment) Stairs: No Wheelchair Mobility Wheelchair Mobility: No    Exercises General Exercises - Upper Extremity Shoulder Flexion: PROM;Both;5 reps;Supine Shoulder ABduction: PROM;Both;5 reps;Supine Elbow Flexion: PROM;Both;5 reps;Supine Elbow Extension: PROM;Both;5 reps;Supine Wrist Flexion: PROM;Both;5 reps;Supine General Exercises - Lower Extremity Ankle Circles/Pumps: PROM;Both;5 reps;Supine Heel Slides: PROM;Both;5 reps;Supine Hip ABduction/ADduction: PROM;Both;5 reps;Supine Straight Leg Raises: PROM;Both;5 reps;Supine Hip Flexion/Marching: PROM;Both;5 reps;Supine   PT Diagnosis: Generalized weakness  PT Problem List: Decreased range of motion;Decreased strength;Decreased activity tolerance;Decreased balance;Decreased mobility;Decreased safety awareness PT Treatment Interventions:  Patient/family education;Therapeutic exercise;DME instruction;Functional mobility training;Therapeutic activities;Balance training   PT Goals Acute Rehab PT Goals PT Goal Formulation: With patient Time For Goal Achievement: 03/26/12 Potential to Achieve Goals: Fair Pt will go Supine/Side to Sit: with mod assist;with rail PT Goal: Supine/Side to Sit - Progress: Goal set today Pt will Sit at Edge of Bed: with mod assist;1-2 min;with bilateral upper extremity support PT Goal: Sit at Edge Of Bed - Progress: Goal set today PT Goal: Sit to Stand - Progress: Discontinued (comment) Pt will Perform Home Exercise Program: Other (comment) (with mod assist) PT Goal: Perform Home Exercise Program - Progress: Goal set today  Visit Information  Last PT Received On: 03/12/12 Assistance Needed: +1 (since AA/ROM only at present)    Subjective Data  Subjective: Pt intubated orally Patient Stated Goal: Unable to state   Prior Functioning  Home Living Additional Comments: Unable to assess - pt intubated and no family present    Cognition  Cognition Overall Cognitive Status: Difficult to assess Area of Impairment: Following commands Difficult to assess due to: Intubated Arousal/Alertness: Lethargic Behavior During Session: Lethargic Cognition - Other Comments: Only followed commands to open eyes    Extremity/Trunk Assessment Right Upper Extremity Assessment RUE ROM/Strength/Tone: Unable to fully assess;Deficits RUE ROM/Strength/Tone Deficits: P/ROM WFL Left Upper Extremity Assessment LUE ROM/Strength/Tone: Deficits;Unable to fully assess LUE ROM/Strength/Tone Deficits: P/ROM WFL Right Lower Extremity Assessment RLE ROM/Strength/Tone: Deficits;Unable to fully assess RLE ROM/Strength/Tone Deficits: P/ROM WFL Left Lower Extremity Assessment LLE ROM/Strength/Tone: Unable to fully assess;Deficits LLE ROM/Strength/Tone Deficits: P/ROM WFL   Balance    End of Session PT - End of  Session Equipment Utilized During Treatment: Oxygen Activity Tolerance: Patient limited by fatigue;Other (comment) (Limited by lethargy) Patient left: in bed;with call bell/phone within reach Nurse Communication: Mobility status;Need for  lift equipment       [name],Kaliq Lege 03/12/2012, 12:50 PM  [facility] [phone] (pager)

## 2012-03-12 NOTE — Progress Notes (Signed)
UR Completed.  Vergie Living [phone] 03/12/2012

## 2012-03-12 NOTE — Progress Notes (Addendum)
PULMONARY  / CRITICAL CARE MEDICINE  Name: Connor Small MRN: [id number] DOB: [birth year]    ADMISSION DATE:  02/26/2012 CONSULTATION DATE:  2/5  REFERRING MD :  Karilyn Cota  CHIEF COMPLAINT:  Acute resp failure   BRIEF PATIENT DESCRIPTION:  50 YOM admitted to [facility] for Cdiff on 2/2. Underwent decompressive cecostomy 2/3, initially was better, then worsened on 2/4 with increased abdominal distension and tenderness, decreased renal function.  He was taken back to the OR on 2/4 for total abdominal colectomy and end ileostomy.  Post op he developed ARDS and sepsis shock and was being treated for this.  The morning of 2/6 he had new onset of A-fib and transferred to [facility].    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 2/10- CT abdo /pelvis- small hematoma likely subcapsular spleen, JPs wnl 2/11-borderline BP, pos balance, tube feeds started 2/11 clot around picc, picc dc'ed 2/12-continued borderline BP, neg balance 1 liter 2/14- neo required, 1.2 liters neg, air hunger, changed to PS  2/15 -neg 4 l bal on lasix drip, lasix d/c  2/16- poor neurostatus, ct head neg  LINES / TUBES: ETT - OSH 2/4>>> Foley-OSH 2/3>>> PICC OSH 2/4>>>2/11 Art Line - OSH 2/5>>>out  CULTURES: BCx2 2/4>>>negative BCx2 2/3>>>negative UC 2/3- Negative MRSA PCR 2/3- Negative C diff 2/6>>>Positive Body fluid 2/9>>>WBCs, no organisms 2/15 BC x 2 > 2/15 Sputum >  ANTIBIOTICS: Flagyl 2/3>>> Vanc 2/6 (IV) >>>2/7 azactam 2/6>>>2/7 Oral vanc 2/6>>> IV vanc 2/16 > Aztreonam 2/16 >2/17 Imipenem 2/17>>>  SUBJECTIVE:  Neg balance, lasix drip remains, remains on high dos elevophed  VITAL  SIGNS: Temp:  [97.9 F (36.6 C)-100.2 F (37.9 C)] 99.9 F (37.7 C) (02/17 0400) Pulse Rate:  [91-107] 102 (02/17 0700) Resp:  [12-22] 13 (02/17 0700) BP: (69-105)/(44-73) 83/57 mmHg (02/17 0700) SpO2:  [95 %-100 %] 98 % (02/17 0700) FiO2 (%):  [40 %] 40 % (02/17 0400) Weight:  [93.7 kg (206 lb 9.1 oz)] 93.7 kg (206 lb 9.1 oz) (02/17 0300) HEMODYNAMICS: CVP:  [2 mmHg] 2 mmHg VENTILATOR SETTINGS: Vent Mode:  [-] PRVC FiO2 (%):  [40 %] 40 % Set Rate:  [14 bmp] 14 bmp Vt Set:  [600 mL] 600 mL PEEP:  [5 cmH20] 5 cmH20 Pressure Support:  [10 cmH20] 10 cmH20 Plateau Pressure:  [10 cmH20-15 cmH20] 10 cmH20 INTAKE / OUTPUT: Intake/Output     02/16 0701 - 02/17 0700 02/17 0701 - 02/18 0700   I.V. (mL/kg) 1321.7 (14.1)    NG/GT 0    IV Piggyback 150    TPN     Total Intake(mL/kg) 1471.7 (15.7)    Urine (mL/kg/hr) 1505 (0.7)    Emesis/NG output 15 (0)    Drains 2180 (1)    Stool 575 (0.3)    Total Output 4275     Net -2803.3            PHYSICAL EXAMINATION: General lethargic, follows simple commands Neuro: lethargic , min movement ext  HEENT: ETT in place Cardiovascular: irregular s1 s 2 Lungs: diminshed BS in bases  Abdomen: Distended unchanged, wound site clean, hypoBS, ostomy unchanged Musculoskeletal: 2+ edema, R>L Skin: no rashes  LABS:  Recent Labs Lab 03/08/12 0828  03/09/12 0016 03/09/12 0530  03/10/12 0534 03/10/12 1710 03/11/12 0300 03/11/12 0620 03/12/12 0455  HGB  --   --   --  14.1  --  15.3  --   --  15.3 16.4  WBC  --   --   --  42.9*  --  42.2*  --   --  41.2* 39.4*  PLT  --   --   --  284  --  317  --   --  289 232  NA  --   < >  --  131*  < > 134* 131*  --  133* 131*  K  --   < >  --  4.3  < > 4.8 5.3*  --  5.0 5.8*  CL  --   < >  --  101  < > 102 106  --  102 99  CO2  --   < >  --  23  < > 20 22  --  19 17*  GLUCOSE  --   < >  --  234*  < > 184* 238*  --  243* 143*  BUN  --   < >  --  91*  < > 104* 115*  --  130* 158*  CREATININE  --   < >   --  1.70*  < > 2.04* 2.26*  --  2.35* 2.74*  CALCIUM  --   < >  --  6.7*  < > 6.8* 5.7*  --  7.2* 7.0*  MG  --   --   --  2.3  --   --  2.2  --  2.5  --   PHOS  --   --   --  4.1  --   --  5.6*  --  5.6*  --   AST  --   --   --   --   --  117*  --   --  129* 117*  ALT  --   --   --   --   --  41  --   --  55* 61*  ALKPHOS  --   --   --   --   --  693*  --   --  710* 639*  BILITOT  --   --   --   --   --  2.3*  --   --  3.9* 7.0*  PROT  --   --   --   --   --  4.0*  --   --  4.1* 4.4*  ALBUMIN  --   --   --   --   --  1.1*  --   --  1.3* 1.4*  PROCALCITON  --   --   --   --   --   --   --   --  4.22 5.70  PHART 7.411  --  7.354  --   --   --   --  7.391  --   --   PCO2ART 33.9*  --  36.4  --   --   --   --  25.9*  --   --   PO2ART 80.0  --  73.6*  --   --   --   --  145.0*  --   --   < > = values in this interval not displayed.  Recent Labs Lab  03/11/12 1200 03/11/12 1618 03/11/12 2017 03/12/12 0055 03/12/12 0436  GLUCAP 207* 198* 180* 155* 151*    Imaging: Ct Abdomen Pelvis Wo Contrast  03/11/2012  *RADIOLOGY REPORT*  Clinical Data: Postop colectomy.  Elevated white count and LFTs.  CT ABDOMEN AND PELVIS WITHOUT CONTRAST  Technique:  Multidetector CT imaging of the abdomen and pelvis was performed following the standard protocol without intravenous contrast.  Comparison: 03/05/2012  Findings: Moderate right and small left pleural effusions.  Heart is normal size.  Compressive atelectasis in the lower lobes.  Open midline incision.  Moderate amount of ascites around the spleen.  Changes of subtotal colectomy.  Right lower quadrant ileostomy noted.  Liver, spleen, pancreas, adrenals and kidneys are unremarkable.  Surgical drains within the pelvis.  Small bowel is decompressed and grossly unremarkable. There appears to be mild wall thickening involving much of the stomach, but the stomach is decompressed and difficult to evaluate.  Small hyperdense area noted along the greater curvature of  the stomach posteriorly.  This could represent a small mesenteric hematoma.  No focal fluid collections are seen to suggest abscess.  Foley catheters present in the bladder which is grossly unremarkable.  Aorta is normal caliber.  No acute bony abnormality.  IMPRESSION: Changes of colectomy.  Moderate fluid noted adjacent to the spleen in the left upper quadrant.  This presumably represents loculated ascites rather than subcapsular fluid collection.  Apparent diffuse gastric wall thickening, but the stomach is decompressed and difficult to evaluate.  Small hyperdense rounded collection posterior to the greater curvature of the stomach, possibly small adjacent postoperative hematoma.  Moderate right pleural effusion and small left pleural effusion.   Original Report Authenticated By: Charlett Nose, M.D.    Ct Head Wo Contrast  03/11/2012  *RADIOLOGY REPORT*  Clinical Data: Decreased right side movement.  CT HEAD WITHOUT CONTRAST  Technique:  Contiguous axial images were obtained from the base of the skull through the vertex without contrast.  Comparison: 03/05/2012  Findings: No acute intracranial abnormality.  Specifically, no hemorrhage, hydrocephalus, mass lesion, acute infarction, or significant intracranial injury.  No acute calvarial abnormality. Old right cerebellar infarct.  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  Stable small amount of fluid in the mastoid air cells bilaterally. Paranasal sinuses are clear.  IMPRESSION: Stable exam.  No acute intracranial abnormality.   Original Report Authenticated By: Charlett Nose, M.D.    Dg Chest Port 1 View  03/12/2012  *RADIOLOGY REPORT*  Clinical Data: Intubated patient.  PORTABLE CHEST - 1 VIEW  Comparison: 03/11/2012  Findings: Endotracheal tube is 2.5 cm above the carina. Nasogastric tube extends into the abdomen.  Hazy densities at the right lung base could represent atelectasis and possibly pleural fluid.  There is left basilar  atelectasis.  Heart size is normal. Left jugular central line in the upper SVC region.  IMPRESSION: Increased densities at the right lung base are concerning for atelectasis and possibly pleural fluid.  Left basilar atelectasis.   Original Report Authenticated By: Richarda Overlie, M.D.    Dg Chest Port 1 View  03/11/2012  *RADIOLOGY REPORT*  Clinical Data: Intubated.  PORTABLE CHEST - 1 VIEW  Comparison: 03/10/2012  Findings: Support devices are stable.  Bibasilar opacities, likely atelectasis.  Suspect small effusions.  Heart is normal size.  No acute bony abnormality.  IMPRESSION: Small bilateral effusions and bibasilar atelectasis, similar to prior study.   Original Report Authenticated By: Charlett Nose, M.D.    CXR 2/17 >basiar  atx, ett wnl, line wnl  ASSESSMENT / PLAN:  PULMONARY A: Acute respiratory failure in setting of septic shock and volume overload.  Lt pleural effusion. 2/16 >CXR improved aeration w/ diuresis,  . Weaning well on PS. Remains weak.   P:   Await improved neuro fxn Wean cpap5 ps 5, goal 2 hrs Last abg reviewed, Tv to remain at 600, follow plat pressures cotninue neg balance Consider trach , day 14, neuro issues remain pcxr in am Consider cvvhd  CARDIOVASCULAR A: Septic Shock - Secondary to C-diff colitis; off pressors 2/08. Restarted 2/13 (relatd to volume?) Atrial Fibrillation with RVR.> Low [place] score. High output from JP's Clot around picc, dvt rue 2/11 >unable to wean off pressors, despite lower MAP goal  Good response to Lasix drip w/ neg 4 L bal . Lasix off 2/15 . Now in SR   P: .  Heparin drip for dvt arm Levophed to MAp goal 60-65 Full dose stress steroids 50 q6h  Change line, source sepsis? 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 Hx of ulcerative colitis. HIGH output from JP's ?ostomy status 2/16 TNA d/c 2/15 w/ TF near goal , elevated alk phos persists , T. Bili rising   P:   Post-op care per [facility] pepcid Avoiding ppi Avoid tpn  HEMATOLOGIC A: Leukocytosis worsening, volume status hemoconcetration? New sepsis? Low grade fevers  Thrombocytopenia. Resolved  dvt picc aassociated rue 2/11 Small splenic hematoma P:  F/u CBC in am scd Heparin for fib and dvt tolerated well, cbc noted NOT to drop coags now , for trach  INFECTIOUS A: C diff colitis, leukocytosis ( volume?) 2/16 WBC tr up /on pressors ? Steroid r/t vs Infectious source  R.o line P:   Continue enteral vancomycin and IV flagyl  Vanc IV and dc aztreonam Add imipenem Dc line Ct reviewed  ENDOCRINE A: Hyperglycemia. uncontrolled Relative adrenal insufficiency. P:   SSI Lantus to 30 u, npo , may need to hold Solucortef 50 q12 to q6h   NEUROLOGIC A:  Acute encephalopathy IMproved daily, severe debilliattion 2/16: not moving right side , lethargic , follows simple commands  P:   Consider CT head neg Treat septic enceph  may need mri brain  Updated wife  at bedside  CC time 35 minutes.   I have interviewed and examined the patient and reviewed the database. I have formulated the assessment and plan as reflected in the note above with amendments made by me. 30 mins of direct critical care time provided. Wife updated in detail @ bedside  Mcarthur Rossetti. Tyson Alias, MD, FACP Pgr: [phone] [facility]

## 2012-03-12 NOTE — Progress Notes (Signed)
13 Days Post-Op  Subjective: Remains on vent, hypotensive.  Is responsive  Objective: Vital signs in last 24 hours: Temp:  [97.9 F (36.6 C)-100.2 F (37.9 C)] 99.9 F (37.7 C) (02/17 0400) Pulse Rate:  [45-107] 102 (02/17 0700) Resp:  [12-22] 13 (02/17 0700) BP: (69-105)/(44-73) 83/57 mmHg (02/17 0700) SpO2:  [95 %-100 %] 98 % (02/17 0700) FiO2 (%):  [40 %] 40 % (02/17 0400) Weight:  [206 lb 9.1 oz (93.7 kg)] 206 lb 9.1 oz (93.7 kg) (02/17 0300) Last BM Date: 03/11/12  Intake/Output from previous day: 02/16 0701 - 02/17 0700 In: 1471.7 [I.V.:1321.7; IV Piggyback:150] Out: 8182 [Urine:1505; Emesis/NG output:15; Drains:2180; Stool:575] Intake/Output this shift:    Abdomen soft, non distended, minimally tender Drains serosang Ostomy viable  Lab Results:   Recent Labs  03/11/12 0620 03/12/12 0455  WBC 41.2* 39.4*  HGB 15.3 16.4  HCT 45.3 49.9  PLT 289 232   BMET  Recent Labs  03/11/12 0620 03/12/12 0455  NA 133* 131*  K 5.0 5.8*  CL 102 99  CO2 19 17*  GLUCOSE 243* 143*  BUN 130* 158*  CREATININE 2.35* 2.74*  CALCIUM 7.2* 7.0*   PT/INR No results found for this basename: LABPROT, INR,  in the last 72 hours ABG  Recent Labs  03/11/12 0300  PHART 7.391  HCO3 15.4*    Studies/Results: Ct Abdomen Pelvis Wo Contrast  03/11/2012  *RADIOLOGY REPORT*  Clinical Data: Postop colectomy.  Elevated white count and LFTs.  CT ABDOMEN AND PELVIS WITHOUT CONTRAST  Technique:  Multidetector CT imaging of the abdomen and pelvis was performed following the standard protocol without intravenous contrast.  Comparison: 03/05/2012  Findings: Moderate right and small left pleural effusions.  Heart is normal size.  Compressive atelectasis in the lower lobes.  Open midline incision.  Moderate amount of ascites around the spleen.  Changes of subtotal colectomy.  Right lower quadrant ileostomy noted.  Liver, spleen, pancreas, adrenals and kidneys are unremarkable.  Surgical drains  within the pelvis.  Small bowel is decompressed and grossly unremarkable. There appears to be mild wall thickening involving much of the stomach, but the stomach is decompressed and difficult to evaluate.  Small hyperdense area noted along the greater curvature of the stomach posteriorly.  This could represent a small mesenteric hematoma.  No focal fluid collections are seen to suggest abscess.  Foley catheters present in the bladder which is grossly unremarkable.  Aorta is normal caliber.  No acute bony abnormality.  IMPRESSION: Changes of colectomy.  Moderate fluid noted adjacent to the spleen in the left upper quadrant.  This presumably represents loculated ascites rather than subcapsular fluid collection.  Apparent diffuse gastric wall thickening, but the stomach is decompressed and difficult to evaluate.  Small hyperdense rounded collection posterior to the greater curvature of the stomach, possibly small adjacent postoperative hematoma.  Moderate right pleural effusion and small left pleural effusion.   Original Report Authenticated By: Rolm Baptise, M.D.    Ct Head Wo Contrast  03/11/2012  *RADIOLOGY REPORT*  Clinical Data: Decreased right side movement.  CT HEAD WITHOUT CONTRAST  Technique:  Contiguous axial images were obtained from the base of the skull through the vertex without contrast.  Comparison: 03/05/2012  Findings: No acute intracranial abnormality.  Specifically, no hemorrhage, hydrocephalus, mass lesion, acute infarction, or significant intracranial injury.  No acute calvarial abnormality. Old right cerebellar infarct.  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  Stable small amount of fluid in  the mastoid air cells bilaterally. Paranasal sinuses are clear.  IMPRESSION: Stable exam.  No acute intracranial abnormality.   Original Report Authenticated By: Rolm Baptise, M.D.    Dg Chest Port 1 View  03/12/2012  *RADIOLOGY REPORT*  Clinical Data: Intubated patient.   PORTABLE CHEST - 1 VIEW  Comparison: 03/11/2012  Findings: Endotracheal tube is 2.5 cm above the carina. Nasogastric tube extends into the abdomen.  Hazy densities at the right lung base could represent atelectasis and possibly pleural fluid.  There is left basilar atelectasis.  Heart size is normal. Left jugular central line in the upper SVC region.  IMPRESSION: Increased densities at the right lung base are concerning for atelectasis and possibly pleural fluid.  Left basilar atelectasis.   Original Report Authenticated By: Markus Daft, M.D.    Dg Chest Port 1 View  03/11/2012  *RADIOLOGY REPORT*  Clinical Data: Intubated.  PORTABLE CHEST - 1 VIEW  Comparison: 03/10/2012  Findings: Support devices are stable.  Bibasilar opacities, likely atelectasis.  Suspect small effusions.  Heart is normal size.  No acute bony abnormality.  IMPRESSION: Small bilateral effusions and bibasilar atelectasis, similar to prior study.   Original Report Authenticated By: Rolm Baptise, M.D.     Anti-infectives: Anti-infectives   Start     Dose/Rate Route Frequency Ordered Stop   03/11/12 1200  vancomycin (VANCOCIN) 750 mg in sodium chloride 0.9 % 150 mL IVPB     750 mg 150 mL/hr over 60 Minutes Intravenous Every 12 hours 03/11/12 0204     03/11/12 0215  vancomycin (VANCOCIN) 750 mg in sodium chloride 0.9 % 150 mL IVPB     750 mg 150 mL/hr over 60 Minutes Intravenous  Once 03/11/12 0204 03/11/12 0352   03/11/12 0215  aztreonam (AZACTAM) 1 g in dextrose 5 % 50 mL IVPB     1 g 100 mL/hr over 30 Minutes Intravenous 3 times per day 03/11/12 0204     03/05/12 2200  metroNIDAZOLE (FLAGYL) IVPB 500 mg    Comments:  First dose ASAP   500 mg 100 mL/hr over 60 Minutes Intravenous Every 8 hours 03/05/12 1346     03/02/12 1200  vancomycin (VANCOCIN) 50 mg/mL oral solution 500 mg     500 mg Oral 4 times per day 03/02/12 0913     03/01/12 1445  vancomycin (VANCOCIN) 50 mg/mL oral solution 500 mg  Status:  Discontinued     500 mg  Oral 3 times per day 03/01/12 1340 03/02/12 0913   03/01/12 1400  aztreonam (AZACTAM) 1 g in dextrose 5 % 50 mL IVPB  Status:  Discontinued     1 g 100 mL/hr over 30 Minutes Intravenous 3 times per day 03/01/12 1356 03/02/12 1146   03/01/12 1300  vancomycin (VANCOCIN) 1,250 mg in sodium chloride 0.9 % 250 mL IVPB  Status:  Discontinued     1,250 mg 166.7 mL/hr over 90 Minutes Intravenous Every 12 hours 03/01/12 1127 03/02/12 1146   02/29/12 0600  ciprofloxacin (CIPRO) IVPB 400 mg     400 mg 200 mL/hr over 60 Minutes Intravenous On call to O.R. 02/28/12 1409 02/28/12 1438   02/28/12 2200  vancomycin (VANCOCIN) 500 mg in sodium chloride irrigation 0.9 % 60 mL ENEMA  Status:  Discontinued     500 mg Rectal 2 times daily 02/28/12 1504 02/28/12 1817   02/28/12 1200  vancomycin (VANCOCIN) 50 mg/mL oral solution 500 mg  Status:  Discontinued    Comments:  First dose ASAP  500 mg Per Tube 4 times per day 02/28/12 0814 02/28/12 1817   02/28/12 1000  vancomycin (VANCOCIN) IVPB 1000 mg/200 mL premix  Status:  Discontinued     1,000 mg 200 mL/hr over 60 Minutes Intravenous Every 12 hours 02/28/12 0845 03/01/12 1127   02/28/12 1000  vancomycin (VANCOCIN) 500 mg in sodium chloride irrigation 0.9 % 100 mL ENEMA  Status:  Discontinued     500 mg Rectal 2 times daily 02/28/12 0923 02/28/12 1504   02/27/12 1600  metroNIDAZOLE (FLAGYL) IVPB 500 mg  Status:  Discontinued     500 mg 100 mL/hr over 60 Minutes Intravenous Every 8 hours 02/27/12 1546 02/27/12 2000   02/27/12 1600  ciprofloxacin (CIPRO) IVPB 400 mg  Status:  Discontinued     400 mg 200 mL/hr over 60 Minutes Intravenous Every 12 hours 02/27/12 1547 02/27/12 2000   02/27/12 1545  metroNIDAZOLE (FLAGYL) IVPB 500 mg  Status:  Discontinued     500 mg 100 mL/hr over 60 Minutes Intravenous  Once 02/27/12 1535 02/28/12 0843   02/27/12 1545  vancomycin (VANCOCIN) 500 mg in sodium chloride 0.9 % 100 mL IVPB  Status:  Discontinued     500 mg 100 mL/hr  over 60 Minutes Intravenous  Once 02/27/12 1535 02/27/12 1538   02/27/12 1545  ciprofloxacin (CIPRO) IVPB 400 mg  Status:  Discontinued     400 mg 200 mL/hr over 60 Minutes Intravenous  Once 02/27/12 1538 02/28/12 0843   02/27/12 1415  vancomycin (VANCOCIN) 500 mg in sodium chloride 0.9 % 100 mL IVPB     500 mg 100 mL/hr over 60 Minutes Intravenous  Once 02/27/12 1402 02/27/12 1411   02/27/12 1400  vancomycin (VANCOCIN) powder 500 mg  Status:  Discontinued     500 mg Other To Surgery 02/27/12 1359 02/27/12 1402   02/27/12 1200  metroNIDAZOLE (FLAGYL) IVPB 500 mg  Status:  Discontinued    Comments:  First dose ASAP   500 mg 100 mL/hr over 60 Minutes Intravenous Every 6 hours 02/27/12 0953 03/05/12 1346   02/27/12 1200  vancomycin (VANCOCIN) 50 mg/mL oral solution 250 mg  Status:  Discontinued    Comments:  First dose ASAP   250 mg Oral 4 times per day 02/27/12 0953 02/28/12 0814   02/26/12 1700  oseltamivir (TAMIFLU) capsule 75 mg     75 mg Oral  Once 02/26/12 1651 02/26/12 1715      Assessment/Plan: s/p Procedure(s): TOTAL COLECTOMY (N/A) ILEOSTOMY (N/A)  CT shows loculated ascites without obvious abscess.  Current drains consistent with this.  Could have IR tap fluid in upper abdomen if this remains a concern.  Will follow for now. Continue wound care  LOS: 15 days    Anai Lipson A 03/12/2012

## 2012-03-12 NOTE — Progress Notes (Signed)
ANTICOAGULATION CONSULT NOTE - Follow Up Consult  Pharmacy Consult for heparin Indication: DVT  Labs:  Recent Labs  03/09/12 0530  03/10/12 0534  03/10/12 1710 03/11/12 0620 03/11/12 1551 03/11/12 2328  HGB 14.1  --  15.3  --   --  15.3  --   --   HCT 42.4  --  43.7  --   --  45.3  --   --   PLT 284  --  317  --   --  289  --   --   HEPARINUNFRC  --   < >  --   < > <0.10* 1.41* 0.72* 0.36  CREATININE 1.70*  < > 2.04*  --  2.26* 2.35*  --   --   < > = values in this interval not displayed.   Assessment/Plan:  52yo male now therapeutic on heparin with difficulty obtaining low therapeutic goal.  Will continue gtt at current rate and confirm stable with am labs.  Rogue Bussing PharmD BCPS 03/12/2012,12:20 AM

## 2012-03-12 NOTE — Progress Notes (Signed)
Dr. Lamonte Sakai made aware of decreasing BP with maxed pressors Levo/Vaso. Fluid removal now to keep even on CVVH. Will continue to monitor.

## 2012-03-12 NOTE — Progress Notes (Signed)
OT Cancellation Note  Patient Details Name: Connor Small MRN: [id number] DOB: April 23, 1960   Cancelled Treatment:    Reason Eval/Treat Not Completed: Medical issues which prohibited therapy;Patient's level of consciousness. Pt not responsive. Pt starting HD today. Will reassess later this week.  [place], OTR/L  [phone] 03/12/2012 03/12/2012, 3:31 PM

## 2012-03-12 NOTE — Procedures (Signed)
Hemodialysis Insertion Procedure Note Connor Small [id number] 08-27-1960  Procedure: Insertion of Hemodialysis Catheter Type: 3 port  Indications: Hemodialysis   Procedure Details Consent: Risks of procedure as well as the alternatives and risks of each were explained to the (patient/caregiver).  Consent for procedure obtained. Time Out: Verified patient identification, verified procedure, site/side was marked, verified correct patient position, special equipment/implants available, medications/allergies/relevent history reviewed, required imaging and test results available.  Performed  Maximum sterile technique was used including antiseptics, cap, gloves, gown, hand hygiene, mask and sheet. Skin prep: Chlorhexidine; local anesthetic administered A antimicrobial bonded/coated triple lumen catheter was placed in the right internal jugular vein using the Seldinger technique. Ultrasound guidance used.yes Catheter placed to 16 cm. Blood aspirated via all 3 ports and then flushed x 3. Line sutured x 2 and dressing applied.  Evaluation Blood flow good Complications: No apparent complications Patient did tolerate procedure well. Chest X-ray ordered to verify placement.  CXR: pending.  Richardson Landry Minor ACNP Maryanna Shape PCCM Pager [phone] till 3 pm If no answer page [phone] 03/12/2012, 12:03 PM   Tolerate dwlel  [place] gudiance Lavon Paganini. Titus Mould, MD, [place] Pgr: [facility]

## 2012-03-12 NOTE — Progress Notes (Addendum)
ANTICOAGULATION CONSULT NOTE - Follow Up Consult  Pharmacy Consult for Heparin Indication: DVT  Allergies  Allergen Reactions  . Penicillins Other (See Comments)    Heart rate changes    Patient Measurements: Height: 5' 9"  (175.3 cm) Weight: 206 lb 9.1 oz (93.7 kg) IBW/kg (Calculated) : 70.7 Heparin Dosing Weight: 95 kg  Vital Signs: Temp: 97.8 F (36.6 C) (02/17 1600) Temp src: Oral (02/17 1230) BP: 103/87 mmHg (02/17 2100) Pulse Rate: 82 (02/17 1924)  Labs:  Recent Labs  03/10/12 0534  03/11/12 0620  03/11/12 2328 03/12/12 0455 03/12/12 0850 03/12/12 1600 03/12/12 2130  HGB 15.3  --  15.3  --   --  16.4  --   --   --   HCT 43.7  --  45.3  --   --  49.9  --   --   --   PLT 317  --  289  --   --  232  --   --   --   APTT  --   --   --   --   --   --  68*  --   --   LABPROT  --   --   --   --   --   --  19.6*  --   --   INR  --   --   --   --   --   --  1.72*  --   --   HEPARINUNFRC  --   < > 1.41*  < > 0.36 0.53  --   --  0.47  CREATININE 2.04*  < > 2.35*  --   --  2.74*  --  2.54*  --   < > = values in this interval not displayed.  Estimated Creatinine Clearance: 38 ml/min (by C-G formula based on Cr of 2.54).   Medications:  Infusions:  . sodium chloride 20 mL/hr at 03/12/12 0700  . sodium chloride 20 mL/hr at 03/06/12 1200  . heparin 800 Units/hr (03/12/12 1327)  . norepinephrine (LEVOPHED) Adult infusion 45 mcg/min (03/12/12 2100)  . dialysis replacement fluid (prismasate) 200 mL/hr at 03/12/12 1447  . dialysate (PRISMASATE) 1,500 mL/hr at 03/12/12 1845  . sodium bicarbonate (isotonic) 1000 mL infusion 300 mL/hr at 03/12/12 2100  . vasopressin (PITRESSIN) infusion - *FOR SHOCK* 0.03 Units/min (03/12/12 1706)  . [DISCONTINUED] heparin 850 Units/hr (03/11/12 2230)    Assessment: 52 y/o male who continues on heparin for arm DVT. Heparin level is therapeutic at 0.47. No bleeding is noted.  Goal of Therapy:  Heparin level 0.3-0.5 units/ml (due to small  splanchnic hematoma on CT scan) Monitor platelets by anticoagulation protocol: Yes   Plan:  -Continue heparin drip at 800 units/hr -Daily heparin level and CBC while on heparin -Monitor for signs/symptoms of bleeding  [facility], [place].D., BCPS Clinical Pharmacist Pager: [phone] 03/12/2012 10:06 PM

## 2012-03-13 ENCOUNTER — Inpatient Hospital Stay (HOSPITAL_COMMUNITY): Payer: BC Managed Care – PPO

## 2012-03-13 LAB — RENAL FUNCTION PANEL
Albumin: 1.5 g/dL — ABNORMAL LOW (ref 3.5–5.2)
BUN: 100 mg/dL — ABNORMAL HIGH (ref 6–23)
Calcium: 6.9 mg/dL — ABNORMAL LOW (ref 8.4–10.5)
Chloride: 95 mEq/L — ABNORMAL LOW (ref 96–112)
Creatinine, Ser: 1.83 mg/dL — ABNORMAL HIGH (ref 0.50–1.35)
GFR calc non Af Amer: 40 mL/min — ABNORMAL LOW (ref >90)
Phosphorus: 6.2 mg/dL — ABNORMAL HIGH (ref 2.3–4.6)

## 2012-03-13 LAB — POCT I-STAT 3, ART BLOOD GAS (G3+)
Bicarbonate: 26.3 mEq/L — ABNORMAL HIGH (ref 20.0–24.0)
O2 Saturation: 99 %
Patient temperature: 98.1
TCO2: 31 mmol/L (ref 0–100)
pCO2 arterial: 43.8 mmHg (ref 35.0–45.0)
pCO2 arterial: 39.8 mmHg (ref 35.0–45.0)
pH, Arterial: 7.432 (ref 7.350–7.450)
pO2, Arterial: 153 mmHg — ABNORMAL HIGH (ref 80.0–100.0)

## 2012-03-13 LAB — COMPREHENSIVE METABOLIC PANEL
AST: 82 U/L — ABNORMAL HIGH (ref 0–37)
CO2: 29 mEq/L (ref 19–32)
Calcium: 7 mg/dL — ABNORMAL LOW (ref 8.4–10.5)
Creatinine, Ser: 1.87 mg/dL — ABNORMAL HIGH (ref 0.50–1.35)
GFR calc Af Amer: 46 mL/min — ABNORMAL LOW (ref >90)
GFR calc non Af Amer: 39 mL/min — ABNORMAL LOW (ref >90)
Sodium: 132 mEq/L — ABNORMAL LOW (ref 135–145)
Total Protein: 4.6 g/dL — ABNORMAL LOW (ref 6.0–8.3)

## 2012-03-13 LAB — CULTURE, RESPIRATORY W GRAM STAIN: Special Requests: NORMAL

## 2012-03-13 LAB — APTT
aPTT: 145 seconds — ABNORMAL HIGH (ref 24–37)
aPTT: 56 seconds — ABNORMAL HIGH (ref 24–37)

## 2012-03-13 LAB — GLUCOSE, CAPILLARY
Glucose-Capillary: 212 mg/dL — ABNORMAL HIGH (ref 70–99)
Glucose-Capillary: 194 mg/dL — ABNORMAL HIGH (ref 70–99)

## 2012-03-13 LAB — HEPARIN LEVEL (UNFRACTIONATED): Heparin Unfractionated: 0.38 IU/mL (ref 0.30–0.70)

## 2012-03-13 LAB — LACTATE DEHYDROGENASE: LDH: 899 U/L — ABNORMAL HIGH (ref 94–250)

## 2012-03-13 LAB — LACTIC ACID, PLASMA: Lactic Acid, Venous: 3.3 mmol/L — ABNORMAL HIGH (ref 0.5–2.2)

## 2012-03-13 LAB — BILIRUBIN, FRACTIONATED(TOT/DIR/INDIR)
Bilirubin, Direct: 11.7 mg/dL — ABNORMAL HIGH (ref 0.0–0.3)
Indirect Bilirubin: 2.2 mg/dL — ABNORMAL HIGH (ref 0.3–0.9)
Total Bilirubin: 13.9 mg/dL — ABNORMAL HIGH (ref 0.3–1.2)

## 2012-03-13 LAB — PROTIME-INR: Prothrombin Time: 17.8 seconds — ABNORMAL HIGH (ref 11.6–15.2)

## 2012-03-13 LAB — MAGNESIUM: Magnesium: 2.4 mg/dL (ref 1.5–2.5)

## 2012-03-13 LAB — PHOSPHORUS: Phosphorus: 6.3 mg/dL — ABNORMAL HIGH (ref 2.3–4.6)

## 2012-03-13 MED ORDER — PRISMASOL BGK 4/2.5 32-4-2.5 MEQ/L IV SOLN
INTRAVENOUS | Status: DC
  Administered 2012-03-13 – 2012-03-14 (×3): via INTRAVENOUS_CENTRAL
  Filled 2012-03-13 (×3): qty 5000

## 2012-03-13 MED ORDER — SODIUM CHLORIDE 0.9 % IV SOLN
100.0000 mg | Freq: Every day | INTRAVENOUS | Status: DC
  Administered 2012-03-14 – 2012-03-18 (×5): 100 mg via INTRAVENOUS
  Filled 2012-03-13 (×6): qty 100

## 2012-03-13 MED ORDER — SODIUM CHLORIDE 0.9 % IV BOLUS (SEPSIS)
750.0000 mL | Freq: Once | INTRAVENOUS | Status: AC
  Administered 2012-03-13: 750 mL via INTRAVENOUS

## 2012-03-13 MED ORDER — SODIUM CHLORIDE 0.9 % IV SOLN
100.0000 mg | Freq: Every day | INTRAVENOUS | Status: AC
  Administered 2012-03-13: 100 mg via INTRAVENOUS
  Filled 2012-03-13: qty 100

## 2012-03-13 MED ORDER — INSULIN GLARGINE 100 UNIT/ML ~~LOC~~ SOLN
35.0000 [IU] | Freq: Every day | SUBCUTANEOUS | Status: DC
  Administered 2012-03-13: 35 [IU] via SUBCUTANEOUS

## 2012-03-13 NOTE — Progress Notes (Signed)
PULMONARY  / CRITICAL CARE MEDICINE  Name: Connor Small MRN: [id number] DOB: 1960-02-28    ADMISSION DATE:  02/26/2012 CONSULTATION DATE:  2/5  REFERRING MD :  Karilyn Cota  CHIEF COMPLAINT:  Acute resp failure   BRIEF PATIENT DESCRIPTION:  [name] admitted to [facility] for Cdiff on 2/2. Underwent decompressive cecostomy 2/3, initially was better, then worsened on 2/4 with increased abdominal distension and tenderness, decreased renal function.  He was taken back to the OR on 2/4 for total abdominal colectomy and end ileostomy.  Post op he developed ARDS and sepsis shock and was being treated for this.  The morning of 2/6 he had new onset of A-fib and transferred to [facility].    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 2/10- CT abdo /pelvis- small hematoma likely subcapsular spleen, JPs wnl 2/11-borderline BP, pos balance, tube feeds started 2/11 clot around picc, picc dc'ed 2/12-continued borderline BP, neg balance 1 liter 2/14- neo required, 1.2 liters neg, air hunger, changed to PS  2/15 -neg 4 l bal on lasix drip, lasix d/c  2/16- poor neurostatus, ct head neg 2/17- cvvhd, high pressor needs  LINES / TUBES: ETT - OSH 2/4>>> Foley-OSH 2/3>>> PICC OSH 2/4>>>2/11 Art Line - OSH 2/5>>>out Rt ij HD 2/17>>>  CULTURES: BCx2 2/4>>>negative BCx2 2/3>>>negative UC 2/3- Negative MRSA PCR 2/3- Negative C diff 2/6>>>Positive Body fluid 2/9>>>WBCs, no organisms 2/15 BC x 2 > 2/15 Sputum >>>NF  ANTIBIOTICS: Flagyl 2/3>>> Vanc 2/6 (IV) >>>2/7 azactam 2/6>>>2/7 Oral vanc 2/6>>> IV vanc 2/16 > Aztreonam 2/16 >2/17 Imipenem 2/17>>>  SUBJECTIVE:  Neg balance, cvp  down to 3, levophed high dose  VITAL SIGNS: Temp:  [97 F (36.1 C)-100.4 F (38 C)] 97 F (36.1 C) (02/18 0346) Pulse Rate:  [82-117] 103 (02/18 0602) Resp:  [13-23] 13 (02/18 0602) BP: (63-109)/(35-87) 82/35 mmHg (02/18 0602) SpO2:  [96 %-100 %] 100 % (02/18 0602) FiO2 (%):  [40 %] 40 % (02/18 0336) Weight:  [92.5 kg (203 lb 14.8 oz)] 92.5 kg (203 lb 14.8 oz) (02/18 0500) HEMODYNAMICS: CVP:  [2 mmHg-4 mmHg] 3 mmHg VENTILATOR SETTINGS: Vent Mode:  [-] PRVC FiO2 (%):  [40 %] 40 % Set Rate:  [14 bmp] 14 bmp Vt Set:  [600 mL] 600 mL PEEP:  [5 cmH20] 5 cmH20 Pressure Support:  [5 cmH20] 5 cmH20 Plateau Pressure:  [13 cmH20-15 cmH20] 13 cmH20 INTAKE / OUTPUT: Intake/Output     02/17 0701 - 02/18 0700   I.V. (mL/kg) 1488.7 (16.1)   NG/GT 715   IV Piggyback 800   Total Intake(mL/kg) 3003.7 (32.5)   Urine (mL/kg/hr) 770 (0.3)   Drains 775 (0.3)   Other 1605 (0.7)   Stool 550 (0.2)   Total Output 3700   Net -696.3         PHYSICAL EXAMINATION: General lethargic, follows simple commands Neuro: moves both ext equal, per, follows simple commands HEENT: ETT in place Cardiovascular: irregular s1 s 2 Lungs: diminshed BS in bases, anterior clear  Abdomen: Distended unchanged, wound site clean, hypoBS, ostomy unchanged Musculoskeletal: 2+ edema Skin: no rashes  LABS:  Recent Labs Lab 03/08/12 0828  03/09/12 0016  03/10/12 1710 03/11/12 0300 03/11/12 0620 03/12/12 0455 03/12/12 0850 03/12/12 1600 03/13/12 0445  HGB  --   --   --   < >  --   --  15.3 16.4  --   --  16.6  WBC  --   --   --   < >  --   --  41.2* 39.4*  --   --  28.7*  PLT  --   --   --   < >  --   --  289 232  --   --  148*  NA  --   < >  --   < > 131*  --  133* 131*  --  134* 132*  K  --   < >  --   < > 5.3*  --  5.0 5.8*  --  5.1 4.6  CL  --   < >  --   < > 106  --  102 99  --  98 91*  CO2  --   < >  --   < > 22  --  19 17*  --  22 29  GLUCOSE  --   < >  --   < > 238*  --  243* 143*  --  139* 236*   BUN  --   < >  --   < > 115*  --  130* 158*  --  148* 113*  CREATININE  --   < >  --   < > 2.26*  --  2.35* 2.74*  --  2.54* 1.87*  CALCIUM  --   < >  --   < > 5.7*  --  7.2* 7.0*  --  6.8* 7.0*  MG  --   --   --   < > 2.2  --  2.5  --   --   --  2.4  PHOS  --   --   --   < > 5.6*  --  5.6*  --   --  7.4* 6.3*  AST  --   --   --   < >  --   --  129* 117*  --   --  82*  ALT  --   --   --   < >  --   --  55* 61*  --   --  62*  ALKPHOS  --   --   --   < >  --   --  710* 639*  --   --  680*  BILITOT  --   --   --   < >  --   --  3.9* 7.0*  --   --  13.2*  PROT  --   --   --   < >  --   --  4.1* 4.4*  --   --  4.6*  ALBUMIN  --   --   --   < >  --   --  1.3* 1.4*  --  1.5* 1.6*  APTT  --   --   --   --   --   --   --   --  68*  --  145*  INR  --   --   --   --   --   --   --   --  1.72*  --   --   PROCALCITON  --   --   --   --   --   --  4.22 5.70  --   --   --   PHART 7.411  --  7.354  --   --  7.391  --   --   --   --   --   PCO2ART 33.9*  --  36.4  --   --  25.9*  --   --   --   --   --   PO2ART 80.0  --  73.6*  --   --  145.0*  --   --   --   --   --   < > = values in this interval not displayed.  Recent Labs Lab 03/12/12 1228 03/12/12 1641 03/12/12 2054 03/13/12 0051 03/13/12 0345  GLUCAP 143* 127* 198* 274* 239*    Imaging: Ct Abdomen Pelvis Wo Contrast  03/11/2012  *RADIOLOGY REPORT*  Clinical Data: Postop colectomy.  Elevated white count and LFTs.  CT ABDOMEN AND PELVIS WITHOUT CONTRAST  Technique:  Multidetector CT imaging of the abdomen and pelvis was performed following the standard protocol without intravenous contrast.  Comparison: 03/05/2012  Findings: Moderate right and small left pleural effusions.  Heart is normal size.  Compressive atelectasis in the lower lobes.  Open midline incision.  Moderate amount of ascites around the spleen.  Changes of subtotal colectomy.  Right lower quadrant ileostomy noted.  Liver, spleen, pancreas, adrenals and kidneys are unremarkable.   Surgical drains within the pelvis.  Small bowel is decompressed and grossly unremarkable. There appears to be mild wall thickening involving much of the stomach, but the stomach is decompressed and difficult to evaluate.  Small hyperdense area noted along the greater curvature of the stomach posteriorly.  This could represent a small mesenteric hematoma.  No focal fluid collections are seen to suggest abscess.  Foley catheters present in the bladder which is grossly unremarkable.  Aorta is normal caliber.  No acute bony abnormality.  IMPRESSION: Changes of colectomy.  Moderate fluid noted adjacent to the spleen in the left upper quadrant.  This presumably represents loculated ascites rather than subcapsular fluid collection.  Apparent diffuse gastric wall thickening, but the stomach is decompressed and difficult to evaluate.  Small hyperdense rounded collection posterior to the greater curvature of the stomach, possibly small adjacent postoperative hematoma.  Moderate right pleural effusion and small left pleural effusion.   Original Report Authenticated By: Charlett Nose, M.D.    Ct Head Wo Contrast  03/11/2012  *RADIOLOGY REPORT*  Clinical Data: Decreased right side movement.  CT HEAD WITHOUT CONTRAST  Technique:  Contiguous axial images were obtained from the base of the skull through the vertex without contrast.  Comparison: 03/05/2012  Findings: No acute intracranial abnormality.  Specifically, no hemorrhage, hydrocephalus, mass lesion, acute infarction, or significant intracranial injury.  No acute calvarial abnormality. Old right cerebellar infarct.  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  Stable small amount of fluid in the mastoid air cells bilaterally. Paranasal sinuses are clear.  IMPRESSION: Stable exam.  No acute intracranial abnormality.   Original Report Authenticated By: Charlett Nose, M.D.    US Abdomen Complete  03/12/2012  *RADIOLOGY REPORT*  Clinical Data:   52 year old male with recent colectomy.  Elevated bilirubin.  COMPLETE ABDOMINAL ULTRASOUND  Comparison:  CT abdomen and pelvis without contrast 03/11/2012 and earlier.  Findings:  Gallbladder:  Gallbladder wall thickening up to 6 mm.  No pericholecystic or peri hepatic fluid.  No sludge or stones. Sonographic Murphy's sign could not be evaluated due to decreased mental  status.  Common bile duct:  Normal measuring up to 4 mm diameter.  Liver:  Within normal limits.  No intrahepatic biliary ductal dilatation identified.  IVC:  Appears normal.  Pancreas:  Not visualized due to overlying bowel gas.  Spleen:  Continued perisplenic fluid which appears to be simple except for acute septations.  Mildly heterogeneous splenic echotexture.  Right Kidney:  Negative.  11.5 cm in length.  Left Kidney:  Could not be visualized due to overlying bowel gas.  Abdominal aorta:  Incompletely visualized due to overlying bowel gas, visualized portions within normal limits.  Other findings:  Right pleural effusion.  IMPRESSION: 1.  Gallbladder wall thickening in the absence of sludge or stones but no biliary ductal dilatation.  If there is concern for acalculous cholecystitis, nuclear medicine hepatobiliary scan would be most valuable. 2.  Continued left upper quadrant fluid adjacent to the spleen. Legrand Rams this is not subcapsular, but the distinction is difficult to make in this case.  The content of the fluid collection appears mostly simple aside from some septations. 3.  Right pleural effusion.   Original Report Authenticated By: Erskine Speed, M.D.    Dg Chest Port 1 View  03/12/2012  *RADIOLOGY REPORT*  Clinical Data: 52 year old male right IJ hemodialysis catheter placement.  Intubated.  PORTABLE CHEST - 1 VIEW  Comparison: 0539 hours the same day.  Findings: Right IJ dual lumen dialysis type catheter placed.  Tip at the level of the carina.  Endotracheal tube tip in good position between clavicles and carina.  Stable left IJ central  line. Enteric tube courses to the abdomen.  Stable cardiac size and mediastinal contours.  No pneumothorax, pulmonary edema or consolidation.  Small right pleural effusion suspected.  IMPRESSION:  1.  Right IJ dual lumen dialysis catheter placed, tip at the SVC level.  No pneumothorax. 2. Otherwise, stable lines and tubes. 3.  Stable right pleural effusion.   Original Report Authenticated By: Erskine Speed, M.D.    Dg Chest Port 1 View  03/12/2012  *RADIOLOGY REPORT*  Clinical Data: Intubated patient.  PORTABLE CHEST - 1 VIEW  Comparison: 03/11/2012  Findings: Endotracheal tube is 2.5 cm above the carina. Nasogastric tube extends into the abdomen.  Hazy densities at the right lung base could represent atelectasis and possibly pleural fluid.  There is left basilar atelectasis.  Heart size is normal. Left jugular central line in the upper SVC region.  IMPRESSION: Increased densities at the right lung base are concerning for atelectasis and possibly pleural fluid.  Left basilar atelectasis.   Original Report Authenticated By: Richarda Overlie, M.D.    Dg Chest Port 1 View  03/11/2012  *RADIOLOGY REPORT*  Clinical Data: Intubated.  PORTABLE CHEST - 1 VIEW  Comparison: 03/10/2012  Findings: Support devices are stable.  Bibasilar opacities, likely atelectasis.  Suspect small effusions.  Heart is normal size.  No acute bony abnormality.  IMPRESSION: Small bilateral effusions and bibasilar atelectasis, similar to prior study.   Original Report Authenticated By: Charlett Nose, M.D.    CXR 2/18 >basiar effusion rt greater left, ett wnl, line wnl ASSESSMENT / PLAN:  PULMONARY A: Acute respiratory failure in setting of septic shock and volume overload.  Lt pleural effusion.   P:   NO SBT on levophed 50 mics, if to 20 and declining will restart cotninue even  Balance now Consider trach, re eval when pressors improved, likely thur if needed pcxr in am Continue cvvhd abg now  CARDIOVASCULAR A: Septic Shock -  Secondary  to C-diff colitis; off pressors 2/08. Restarted 2/13 (relatd to volume?) Atrial Fibrillation with RVR.> Low [place] score. High output from JP's Clot around picc, dvt rue 2/11 Shock reoccurrence, related to sepsis? Volume? 2/17 At risk PE  P: .  Heparin drip for dvt arm, hold, place A line See ID Levophed to MAp goal 60 Full dose stress steroids 50 q6h  Vasopressin to remain Lactic acid now, ldh Consider a line, accuracy in ? 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 Hx of ulcerative colitis. HIGH output from JP's ?ostomy status 2/16 TNA d/c 2/15 w/ TF near goal , elevated alk phos persists , T. Bili rising   P:   Post-op care per [facility] pepcid Avoiding ppi with cdiff Avoid tpn, bili etc With such high pressor needs may need yet again, repeat CT abdo? Fra tionat Mickel Fuchs assess lactic, ldh Acute hep panel, no biliary duct issue on CTs   HEMATOLOGIC A: Leukocytosis worsening, volume status hemoconcetration? New sepsis? Low grade fevers  Thrombocytopenia. Resolved  dvt picc aassociated rue 2/11 Small splenic hematoma P:  F/u CBC in am scd Heparin for fib / dvt upper rt, hold for a line then restart coags repeat INR, pt, as may be rising with Liver and would necessitate dc heparin  INFECTIOUS A: C diff colitis, leukocytosis ( volume?) 2/16 WBC tr up /on pressors ? Steroid r/t vs Infectious source  R.o line, other intrabdominal ischemia, thickening stomach? P:    Continue enteral vancomycin and IV flagyl  Vanc IV imipenem Dc line as access other aquired May need repeat CT, although wbc trending down Add myco empric (was on TPN) Follow repeat BC Give volume back  ENDOCRINE A: Hyperglycemia. uncontrolled Relative adrenal insufficiency. P:   SSI Lantus to increase 35 Solucortef 50 q12 to q6h  NEUROLOGIC A:  Acute encephalopathy IMproved daily, severe debilliattion 2/16: not moving right side , lethargic , follows simple commands  P:   Treat septic enceph Int fent Avoid versed Support MAp  CC time 35 minutes.   I have interviewed and examined the patient and reviewed the database. I have formulated the assessment and plan as reflected in the note above with amendments made by me. 30 mins of direct critical care time provided.   Mcarthur Rossetti. Tyson Alias, MD, FACP Pgr: [phone] [facility]

## 2012-03-13 NOTE — Procedures (Signed)
Arterial Catheter Insertion Procedure Note Connor Small [id number] 17-Aug-1960  Procedure: Insertion of Arterial Catheter  Indications: Blood pressure monitoring  Procedure Details Consent: Risks of procedure as well as the alternatives and risks of each were explained to the (patient/caregiver).  Consent for procedure obtained. Time Out: Verified patient identification, verified procedure, site/side was marked, verified correct patient position, special equipment/implants available, medications/allergies/relevent history reviewed, required imaging and test results available.  Performed  Maximum sterile technique was used including antiseptics, cap, gloves, gown, hand hygiene, mask and sheet. Skin prep: Chlorhexidine; local anesthetic administered Catheter was inserted into right femoral artery using the Seldinger technique.  Evaluation Blood flow good; BP tracing good. Complications: No apparent complications.  Procedure performed under ultrasound guidance.  This was used to assure cannulation of correct vessel.  Connor Small 03/13/2012  I performed US gudiance  Connor J. Titus Mould, MD, [name] Pgr: [facility]

## 2012-03-13 NOTE — Progress Notes (Signed)
14 Days Post-Op  Subjective: Events noted.  Worsening hypotension on dialysis last night needing increased pressors  Objective: Vital signs in last 24 hours: Temp:  [97 F (36.1 C)-100.4 F (38 C)] 98.1 F (36.7 C) (02/18 0759) Pulse Rate:  [82-117] 95 (02/18 0800) Resp:  [13-23] 15 (02/18 0800) BP: (63-109)/(35-87) 89/46 mmHg (02/18 0800) SpO2:  [96 %-100 %] 100 % (02/18 0800) FiO2 (%):  [40 %] 40 % (02/18 0336) Weight:  [203 lb 14.8 oz (92.5 kg)] 203 lb 14.8 oz (92.5 kg) (02/18 0500) Last BM Date: 03/12/12  Intake/Output from previous day: 02/17 0701 - 02/18 0700 In: 3144.6 [I.V.:1554.6; NG/GT:790; IV Piggyback:800] Out: 3907 [Urine:770; Drains:775; Stool:550] Intake/Output this shift:    Abdomen soft with no guarding, no firmness Ostomy productive, viable Drains serosang  Lab Results:   Recent Labs  03/12/12 0455 03/13/12 0445  WBC 39.4* 28.7*  HGB 16.4 16.6  HCT 49.9 49.2  PLT 232 148*   BMET  Recent Labs  03/12/12 1600 03/13/12 0445  NA 134* 132*  K 5.1 4.6  CL 98 91*  CO2 22 29  GLUCOSE 139* 236*  BUN 148* 113*  CREATININE 2.54* 1.87*  CALCIUM 6.8* 7.0*   PT/INR  Recent Labs  03/12/12 0850  LABPROT 19.6*  INR 1.72*   ABG  Recent Labs  03/11/12 0300  PHART 7.391  HCO3 15.4*    Studies/Results: Ct Abdomen Pelvis Wo Contrast  03/11/2012  *RADIOLOGY REPORT*  Clinical Data: Postop colectomy.  Elevated white count and LFTs.  CT ABDOMEN AND PELVIS WITHOUT CONTRAST  Technique:  Multidetector CT imaging of the abdomen and pelvis was performed following the standard protocol without intravenous contrast.  Comparison: 03/05/2012  Findings: Moderate right and small left pleural effusions.  Heart is normal size.  Compressive atelectasis in the lower lobes.  Open midline incision.  Moderate amount of ascites around the spleen.  Changes of subtotal colectomy.  Right lower quadrant ileostomy noted.  Liver, spleen, pancreas, adrenals and kidneys are  unremarkable.  Surgical drains within the pelvis.  Small bowel is decompressed and grossly unremarkable. There appears to be mild wall thickening involving much of the stomach, but the stomach is decompressed and difficult to evaluate.  Small hyperdense area noted along the greater curvature of the stomach posteriorly.  This could represent a small mesenteric hematoma.  No focal fluid collections are seen to suggest abscess.  Foley catheters present in the bladder which is grossly unremarkable.  Aorta is normal caliber.  No acute bony abnormality.  IMPRESSION: Changes of colectomy.  Moderate fluid noted adjacent to the spleen in the left upper quadrant.  This presumably represents loculated ascites rather than subcapsular fluid collection.  Apparent diffuse gastric wall thickening, but the stomach is decompressed and difficult to evaluate.  Small hyperdense rounded collection posterior to the greater curvature of the stomach, possibly small adjacent postoperative hematoma.  Moderate right pleural effusion and small left pleural effusion.   Original Report Authenticated By: Rolm Baptise, M.D.    Ct Head Wo Contrast  03/11/2012  *RADIOLOGY REPORT*  Clinical Data: Decreased right side movement.  CT HEAD WITHOUT CONTRAST  Technique:  Contiguous axial images were obtained from the base of the skull through the vertex without contrast.  Comparison: 03/05/2012  Findings: No acute intracranial abnormality.  Specifically, no hemorrhage, hydrocephalus, mass lesion, acute infarction, or significant intracranial injury.  No acute calvarial abnormality. Old right cerebellar infarct.  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  Stable small amount of fluid in the mastoid air cells bilaterally. Paranasal sinuses are clear.  IMPRESSION: Stable exam.  No acute intracranial abnormality.   Original Report Authenticated By: Rolm Baptise, M.D.    US Abdomen Complete  03/12/2012  *RADIOLOGY REPORT*  Clinical  Data:  52 year old male with recent colectomy.  Elevated bilirubin.  COMPLETE ABDOMINAL ULTRASOUND  Comparison:  CT abdomen and pelvis without contrast 03/11/2012 and earlier.  Findings:  Gallbladder:  Gallbladder wall thickening up to 6 mm.  No pericholecystic or peri hepatic fluid.  No sludge or stones. Sonographic Murphy's sign could not be evaluated due to decreased mental status.  Common bile duct:  Normal measuring up to 4 mm diameter.  Liver:  Within normal limits.  No intrahepatic biliary ductal dilatation identified.  IVC:  Appears normal.  Pancreas:  Not visualized due to overlying bowel gas.  Spleen:  Continued perisplenic fluid which appears to be simple except for acute septations.  Mildly heterogeneous splenic echotexture.  Right Kidney:  Negative.  11.5 cm in length.  Left Kidney:  Could not be visualized due to overlying bowel gas.  Abdominal aorta:  Incompletely visualized due to overlying bowel gas, visualized portions within normal limits.  Other findings:  Right pleural effusion.  IMPRESSION: 1.  Gallbladder wall thickening in the absence of sludge or stones but no biliary ductal dilatation.  If there is concern for acalculous cholecystitis, nuclear medicine hepatobiliary scan would be most valuable. 2.  Continued left upper quadrant fluid adjacent to the spleen. Burtis Junes this is not subcapsular, but the distinction is difficult to make in this case.  The content of the fluid collection appears mostly simple aside from some septations. 3.  Right pleural effusion.   Original Report Authenticated By: Roselyn Reef, M.D.    Dg Chest Port 1 View  03/13/2012  *RADIOLOGY REPORT*  Clinical Data: Shortness of breath.  PORTABLE CHEST - 1 VIEW  Comparison: 03/12/2012.  Findings: The support apparatus is stable.  No complicating features.  Persistent right pleural effusion and bibasilar infiltrates.  IMPRESSION:  1.  Stable support apparatus. 2.  Persistent right effusion and bibasilar infiltrates.   Original  Report Authenticated By: Marijo Sanes, M.D.    Dg Chest Port 1 View  03/12/2012  *RADIOLOGY REPORT*  Clinical Data: 52 year old male right IJ hemodialysis catheter placement.  Intubated.  PORTABLE CHEST - 1 VIEW  Comparison: 0539 hours the same day.  Findings: Right IJ dual lumen dialysis type catheter placed.  Tip at the level of the carina.  Endotracheal tube tip in good position between clavicles and carina.  Stable left IJ central line. Enteric tube courses to the abdomen.  Stable cardiac size and mediastinal contours.  No pneumothorax, pulmonary edema or consolidation.  Small right pleural effusion suspected.  IMPRESSION:  1.  Right IJ dual lumen dialysis catheter placed, tip at the SVC level.  No pneumothorax. 2. Otherwise, stable lines and tubes. 3.  Stable right pleural effusion.   Original Report Authenticated By: Roselyn Reef, M.D.    Dg Chest Port 1 View  03/12/2012  *RADIOLOGY REPORT*  Clinical Data: Intubated patient.  PORTABLE CHEST - 1 VIEW  Comparison: 03/11/2012  Findings: Endotracheal tube is 2.5 cm above the carina. Nasogastric tube extends into the abdomen.  Hazy densities at the right lung base could represent atelectasis and possibly pleural fluid.  There is left basilar atelectasis.  Heart size is normal. Left jugular central line in the upper SVC region.  IMPRESSION: Increased  densities at the right lung base are concerning for atelectasis and possibly pleural fluid.  Left basilar atelectasis.   Original Report Authenticated By: Markus Daft, M.D.     Anti-infectives: Anti-infectives   Start     Dose/Rate Route Frequency Ordered Stop   03/14/12 1000  micafungin (MYCAMINE) 100 mg in sodium chloride 0.9 % 100 mL IVPB     100 mg 100 mL/hr over 1 Hours Intravenous Daily 03/13/12 0632     03/13/12 1000  micafungin (MYCAMINE) 100 mg in sodium chloride 0.9 % 100 mL IVPB     100 mg 100 mL/hr over 1 Hours Intravenous Daily 03/13/12 0632 03/14/12 0959   03/12/12 1800  vancomycin  (VANCOCIN) IVPB 1000 mg/200 mL premix     1,000 mg 200 mL/hr over 60 Minutes Intravenous Every 24 hours 03/12/12 0859     03/12/12 1000  imipenem-cilastatin (PRIMAXIN) 500 mg in sodium chloride 0.9 % 100 mL IVPB  Status:  Discontinued     500 mg 200 mL/hr over 30 Minutes Intravenous 3 times per day 03/12/12 0848 03/12/12 0909   03/12/12 1000  imipenem-cilastatin (PRIMAXIN) 250 mg in sodium chloride 0.9 % 100 mL IVPB     250 mg 200 mL/hr over 30 Minutes Intravenous 4 times per day 03/12/12 0909     03/11/12 1200  vancomycin (VANCOCIN) 750 mg in sodium chloride 0.9 % 150 mL IVPB  Status:  Discontinued     750 mg 150 mL/hr over 60 Minutes Intravenous Every 12 hours 03/11/12 0204 03/12/12 0858   03/11/12 0215  vancomycin (VANCOCIN) 750 mg in sodium chloride 0.9 % 150 mL IVPB     750 mg 150 mL/hr over 60 Minutes Intravenous  Once 03/11/12 0204 03/11/12 0352   03/11/12 0215  aztreonam (AZACTAM) 1 g in dextrose 5 % 50 mL IVPB  Status:  Discontinued     1 g 100 mL/hr over 30 Minutes Intravenous 3 times per day 03/11/12 0204 03/12/12 0851   03/05/12 2200  metroNIDAZOLE (FLAGYL) IVPB 500 mg    Comments:  First dose ASAP   500 mg 100 mL/hr over 60 Minutes Intravenous Every 8 hours 03/05/12 1346     03/02/12 1200  vancomycin (VANCOCIN) 50 mg/mL oral solution 500 mg     500 mg Oral 4 times per day 03/02/12 0913     03/01/12 1445  vancomycin (VANCOCIN) 50 mg/mL oral solution 500 mg  Status:  Discontinued     500 mg Oral 3 times per day 03/01/12 1340 03/02/12 0913   03/01/12 1400  aztreonam (AZACTAM) 1 g in dextrose 5 % 50 mL IVPB  Status:  Discontinued     1 g 100 mL/hr over 30 Minutes Intravenous 3 times per day 03/01/12 1356 03/02/12 1146   03/01/12 1300  vancomycin (VANCOCIN) 1,250 mg in sodium chloride 0.9 % 250 mL IVPB  Status:  Discontinued     1,250 mg 166.7 mL/hr over 90 Minutes Intravenous Every 12 hours 03/01/12 1127 03/02/12 1146   02/29/12 0600  ciprofloxacin (CIPRO) IVPB 400 mg      400 mg 200 mL/hr over 60 Minutes Intravenous On call to O.R. 02/28/12 1409 02/28/12 1438   02/28/12 2200  vancomycin (VANCOCIN) 500 mg in sodium chloride irrigation 0.9 % 60 mL ENEMA  Status:  Discontinued     500 mg Rectal 2 times daily 02/28/12 1504 02/28/12 1817   02/28/12 1200  vancomycin (VANCOCIN) 50 mg/mL oral solution 500 mg  Status:  Discontinued  Comments:  First dose ASAP   500 mg Per Tube 4 times per day 02/28/12 0814 02/28/12 1817   02/28/12 1000  vancomycin (VANCOCIN) IVPB 1000 mg/200 mL premix  Status:  Discontinued     1,000 mg 200 mL/hr over 60 Minutes Intravenous Every 12 hours 02/28/12 0845 03/01/12 1127   02/28/12 1000  vancomycin (VANCOCIN) 500 mg in sodium chloride irrigation 0.9 % 100 mL ENEMA  Status:  Discontinued     500 mg Rectal 2 times daily 02/28/12 0923 02/28/12 1504   02/27/12 1600  metroNIDAZOLE (FLAGYL) IVPB 500 mg  Status:  Discontinued     500 mg 100 mL/hr over 60 Minutes Intravenous Every 8 hours 02/27/12 1546 02/27/12 2000   02/27/12 1600  ciprofloxacin (CIPRO) IVPB 400 mg  Status:  Discontinued     400 mg 200 mL/hr over 60 Minutes Intravenous Every 12 hours 02/27/12 1547 02/27/12 2000   02/27/12 1545  metroNIDAZOLE (FLAGYL) IVPB 500 mg  Status:  Discontinued     500 mg 100 mL/hr over 60 Minutes Intravenous  Once 02/27/12 1535 02/28/12 0843   02/27/12 1545  vancomycin (VANCOCIN) 500 mg in sodium chloride 0.9 % 100 mL IVPB  Status:  Discontinued     500 mg 100 mL/hr over 60 Minutes Intravenous  Once 02/27/12 1535 02/27/12 1538   02/27/12 1545  ciprofloxacin (CIPRO) IVPB 400 mg  Status:  Discontinued     400 mg 200 mL/hr over 60 Minutes Intravenous  Once 02/27/12 1538 02/28/12 0843   02/27/12 1415  vancomycin (VANCOCIN) 500 mg in sodium chloride 0.9 % 100 mL IVPB     500 mg 100 mL/hr over 60 Minutes Intravenous  Once 02/27/12 1402 02/27/12 1411   02/27/12 1400  vancomycin (VANCOCIN) powder 500 mg  Status:  Discontinued     500 mg Other To Surgery  02/27/12 1359 02/27/12 1402   02/27/12 1200  metroNIDAZOLE (FLAGYL) IVPB 500 mg  Status:  Discontinued    Comments:  First dose ASAP   500 mg 100 mL/hr over 60 Minutes Intravenous Every 6 hours 02/27/12 0953 03/05/12 1346   02/27/12 1200  vancomycin (VANCOCIN) 50 mg/mL oral solution 250 mg  Status:  Discontinued    Comments:  First dose ASAP   250 mg Oral 4 times per day 02/27/12 0953 02/28/12 0814   02/26/12 1700  oseltamivir (TAMIFLU) capsule 75 mg     75 mg Oral  Once 02/26/12 1651 02/26/12 1715      Assessment/Plan: s/p Procedure(s): TOTAL COLECTOMY (N/A) ILEOSTOMY (N/A)  Given exam, decreasing WBC, CT, etc, I don't think there is anything going on in his abdomen.  I discussed this with his family.  Given his overall situation, I did explain to them that this could suddenly change and he still may come to need reexploration  LOS: 16 days    Everton Bertha A 03/13/2012

## 2012-03-13 NOTE — Progress Notes (Signed)
Patient ID: Connor Small, male   DOB: 1960-08-13, 52 y.o.   MRN: [id number] S:intubated/sedated O:BP 128/68  Pulse 82  Temp(Src) 98.1 F (36.7 C) (Oral)  Resp 16  Ht 5\' 9"  (1.753 m)  Wt 92.5 kg (203 lb 14.8 oz)  BMI 30.1 kg/m2  SpO2 100%  Intake/Output Summary (Last 24 hours) at 03/13/12 0853 Last data filed at 03/13/12 0800  Gross per 24 hour  Intake 3950.7 ml  Output   3857 ml  Net   93.7 ml   Intake/Output: I/O last 3 completed shifts: In: 3847.6 [I.V.:2257.6; NG/GT:790; IV Piggyback:800] Out: 4742 [VZDGL:8756; Emesis/NG output:15; Drains:1550; Other:1812; Stool:800]  Intake/Output this shift:  Total I/O In: 860.9 [I.V.:75.9; NG/GT:35; IV Piggyback:750] Out: 110 [Other:110] Weight change: -1.2 kg (-2 lb 10.3 oz) EPP:IRJJOACZYS ill-appearing, intubated/sedated CVS:no rub Resp:occ rhonchi AYT:KZSWFU with stool, JP drains in LUQ Ext:+anasarca   Recent Labs Lab 03/07/12 0453  03/08/12 0405  03/09/12 0530 03/09/12 1755 03/10/12 0534 03/10/12 1710 03/11/12 0620 03/12/12 0455 03/12/12 1600 03/13/12 0445  NA 135  < > 135  < > 131* 131* 134* 131* 133* 131* 134* 132*  K 5.1  < > 5.0  < > 4.3 4.0 4.8 5.3* 5.0 5.8* 5.1 4.6  CL 106  < > 101  < > 101 102 102 106 102 99 98 91*  CO2 24  < > 25  < > 23 21 20 22 19  17* 22 29  GLUCOSE 231*  < > 171*  < > 234* 250* 184* 238* 243* 143* 139* 236*  BUN 71*  < > 84*  < > 91* 97* 104* 115* 130* 158* 148* 113*  CREATININE 1.43*  < > 1.52*  < > 1.70* 1.81* 2.04* 2.26* 2.35* 2.74* 2.54* 1.87*  ALBUMIN 1.0*  --  1.1*  --   --   --  1.1*  --  1.3* 1.4* 1.5* 1.6*  CALCIUM 6.5*  < > 6.6*  < > 6.7* 6.6* 6.8* 5.7* 7.2* 7.0* 6.8* 7.0*  PHOS  --   --  3.8  --  4.1  --   --  5.6* 5.6*  --  7.4* 6.3*  AST 32  --  49*  --   --   --  117*  --  129* 117*  --  82*  ALT 13  --  17  --   --   --  41  --  55* 61*  --  62*  < > = values in this interval not displayed. Liver Function Tests:  Recent Labs Lab 03/11/12 0620 03/12/12 0455  03/12/12 1600 03/13/12 0445  AST 129* 117*  --  82*  ALT 55* 61*  --  62*  ALKPHOS 710* 639*  --  680*  BILITOT 3.9* 7.0*  --  13.2*  PROT 4.1* 4.4*  --  4.6*  ALBUMIN 1.3* 1.4* 1.5* 1.6*   No results found for this basename: LIPASE, AMYLASE,  in the last 168 hours No results found for this basename: AMMONIA,  in the last 168 hours CBC:  Recent Labs Lab 03/09/12 0530 03/10/12 0534 03/11/12 0620 03/12/12 0455 03/13/12 0445  WBC 42.9* 42.2* 41.2* 39.4* 28.7*  NEUTROABS 37.8* 34.2*  --   --  23.6*  HGB 14.1 15.3 15.3 16.4 16.6  HCT 42.4 43.7 45.3 49.9 49.2  MCV 94.2 94.8 96.6 97.7 96.1  PLT 284 317 289 232 148*   Cardiac Enzymes: No results found for this basename: CKTOTAL, CKMB, CKMBINDEX, TROPONINI,  in the last 168 hours CBG:  Recent Labs Lab 03/12/12 1228 03/12/12 1641 03/12/12 2054 03/13/12 0051 03/13/12 0345  GLUCAP 143* 127* 198* 274* 239*    Iron Studies: No results found for this basename: IRON, TIBC, TRANSFERRIN, FERRITIN,  in the last 72 hours Studies/Results: Ct Abdomen Pelvis Wo Contrast  03/11/2012  *RADIOLOGY REPORT*  Clinical Data: Postop colectomy.  Elevated white count and LFTs.  CT ABDOMEN AND PELVIS WITHOUT CONTRAST  Technique:  Multidetector CT imaging of the abdomen and pelvis was performed following the standard protocol without intravenous contrast.  Comparison: 03/05/2012  Findings: Moderate right and small left pleural effusions.  Heart is normal size.  Compressive atelectasis in the lower lobes.  Open midline incision.  Moderate amount of ascites around the spleen.  Changes of subtotal colectomy.  Right lower quadrant ileostomy noted.  Liver, spleen, pancreas, adrenals and kidneys are unremarkable.  Surgical drains within the pelvis.  Small bowel is decompressed and grossly unremarkable. There appears to be mild wall thickening involving much of the stomach, but the stomach is decompressed and difficult to evaluate.  Small hyperdense area noted  along the greater curvature of the stomach posteriorly.  This could represent a small mesenteric hematoma.  No focal fluid collections are seen to suggest abscess.  Foley catheters present in the bladder which is grossly unremarkable.  Aorta is normal caliber.  No acute bony abnormality.  IMPRESSION: Changes of colectomy.  Moderate fluid noted adjacent to the spleen in the left upper quadrant.  This presumably represents loculated ascites rather than subcapsular fluid collection.  Apparent diffuse gastric wall thickening, but the stomach is decompressed and difficult to evaluate.  Small hyperdense rounded collection posterior to the greater curvature of the stomach, possibly small adjacent postoperative hematoma.  Moderate right pleural effusion and small left pleural effusion.   Original Report Authenticated By: Charlett Nose, M.D.    Ct Head Wo Contrast  03/11/2012  *RADIOLOGY REPORT*  Clinical Data: Decreased right side movement.  CT HEAD WITHOUT CONTRAST  Technique:  Contiguous axial images were obtained from the base of the skull through the vertex without contrast.  Comparison: 03/05/2012  Findings: No acute intracranial abnormality.  Specifically, no hemorrhage, hydrocephalus, mass lesion, acute infarction, or significant intracranial injury.  No acute calvarial abnormality. Old right cerebellar infarct.  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  Stable small amount of fluid in the mastoid air cells bilaterally. Paranasal sinuses are clear.  IMPRESSION: Stable exam.  No acute intracranial abnormality.   Original Report Authenticated By: Charlett Nose, M.D.    US Abdomen Complete  03/12/2012  *RADIOLOGY REPORT*  Clinical Data:  52 year old male with recent colectomy.  Elevated bilirubin.  COMPLETE ABDOMINAL ULTRASOUND  Comparison:  CT abdomen and pelvis without contrast 03/11/2012 and earlier.  Findings:  Gallbladder:  Gallbladder wall thickening up to 6 mm.  No pericholecystic or  peri hepatic fluid.  No sludge or stones. Sonographic Murphy's sign could not be evaluated due to decreased mental status.  Common bile duct:  Normal measuring up to 4 mm diameter.  Liver:  Within normal limits.  No intrahepatic biliary ductal dilatation identified.  IVC:  Appears normal.  Pancreas:  Not visualized due to overlying bowel gas.  Spleen:  Continued perisplenic fluid which appears to be simple except for acute septations.  Mildly heterogeneous splenic echotexture.  Right Kidney:  Negative.  11.5 cm in length.  Left Kidney:  Could not be visualized due to overlying bowel gas.  Abdominal aorta:  Incompletely visualized due to overlying bowel gas, visualized portions within normal limits.  Other findings:  Right pleural effusion.  IMPRESSION: 1.  Gallbladder wall thickening in the absence of sludge or stones but no biliary ductal dilatation.  If there is concern for acalculous cholecystitis, nuclear medicine hepatobiliary scan would be most valuable. 2.  Continued left upper quadrant fluid adjacent to the spleen. Legrand Rams this is not subcapsular, but the distinction is difficult to make in this case.  The content of the fluid collection appears mostly simple aside from some septations. 3.  Right pleural effusion.   Original Report Authenticated By: Erskine Speed, M.D.    Dg Chest Port 1 View  03/13/2012  *RADIOLOGY REPORT*  Clinical Data: Shortness of breath.  PORTABLE CHEST - 1 VIEW  Comparison: 03/12/2012.  Findings: The support apparatus is stable.  No complicating features.  Persistent right pleural effusion and bibasilar infiltrates.  IMPRESSION:  1.  Stable support apparatus. 2.  Persistent right effusion and bibasilar infiltrates.   Original Report Authenticated By: Rudie Meyer, M.D.    Dg Chest Port 1 View  03/12/2012  *RADIOLOGY REPORT*  Clinical Data: 52 year old male right IJ hemodialysis catheter placement.  Intubated.  PORTABLE CHEST - 1 VIEW  Comparison: 0539 hours the same day.  Findings:  Right IJ dual lumen dialysis type catheter placed.  Tip at the level of the carina.  Endotracheal tube tip in good position between clavicles and carina.  Stable left IJ central line. Enteric tube courses to the abdomen.  Stable cardiac size and mediastinal contours.  No pneumothorax, pulmonary edema or consolidation.  Small right pleural effusion suspected.  IMPRESSION:  1.  Right IJ dual lumen dialysis catheter placed, tip at the SVC level.  No pneumothorax. 2. Otherwise, stable lines and tubes. 3.  Stable right pleural effusion.   Original Report Authenticated By: Erskine Speed, M.D.    Dg Chest Port 1 View  03/12/2012  *RADIOLOGY REPORT*  Clinical Data: Intubated patient.  PORTABLE CHEST - 1 VIEW  Comparison: 03/11/2012  Findings: Endotracheal tube is 2.5 cm above the carina. Nasogastric tube extends into the abdomen.  Hazy densities at the right lung base could represent atelectasis and possibly pleural fluid.  There is left basilar atelectasis.  Heart size is normal. Left jugular central line in the upper SVC region.  IMPRESSION: Increased densities at the right lung base are concerning for atelectasis and possibly pleural fluid.  Left basilar atelectasis.   Original Report Authenticated By: Richarda Overlie, M.D.    . antiseptic oral rinse  15 mL Mouth Rinse QID  . chlorhexidine  15 mL Mouth Rinse BID  . famotidine  20 mg Per Tube BID  . feeding supplement  60 mL Per Tube TID  . feeding supplement (VITAL AF 1.2 CAL)  1,000 mL Per Tube Q24H  . hydrocortisone sodium succinate  50 mg Intravenous Q6H  . imipenem-cilastatin  250 mg Intravenous Q6H  . insulin aspart  0-20 Units Subcutaneous Q4H  . insulin glargine  35 Units Subcutaneous Daily  . metronidazole  500 mg Intravenous Q8H  . micafungin (MYCAMINE) IV  100 mg Intravenous Daily   Followed by  . [START ON 03/14/2012] micafungin (MYCAMINE) IV  100 mg Intravenous Daily  . multivitamin  5 mL Per Tube Daily  . sodium chloride  10-40 mL Intracatheter  Q12H  . vancomycin  500 mg Oral Q6H  . vancomycin  1,000 mg Intravenous Q24H    BMET  Component Value Date/Time   NA 132* 03/13/2012 0445   K 4.6 03/13/2012 0445   CL 91* 03/13/2012 0445   CO2 29 03/13/2012 0445   GLUCOSE 236* 03/13/2012 0445   BUN 113* 03/13/2012 0445   CREATININE 1.87* 03/13/2012 0445   CALCIUM 7.0* 03/13/2012 0445   GFRNONAA 39* 03/13/2012 0445   GFRAA 46* 03/13/2012 0445   CBC    Component Value Date/Time   WBC 28.7* 03/13/2012 0445   RBC 5.12 03/13/2012 0445   HGB 16.6 03/13/2012 0445   HCT 49.2 03/13/2012 0445   PLT 148* 03/13/2012 0445   MCV 96.1 03/13/2012 0445   MCH 32.4 03/13/2012 0445   MCHC 33.7 03/13/2012 0445   RDW 20.2* 03/13/2012 0445   LYMPHSABS 1.1 03/13/2012 0445   MONOABS 3.4* 03/13/2012 0445   EOSABS 0.0 03/13/2012 0445   BASOSABS 0.6* 03/13/2012 0445    Assessment/Plan:  1. ARF- nonoliguric. Likely ischemic ATN in light of intravascular volume depletion, hypotension, and ongoing pressors. Metabolic abnormalities improved post initiation of CVVHD, however not able to tolerate UF due to hypotension.  New arterial line shows discrepancy of between cuff and a-line.  Hopefully will be able to improve pleural effusion and facilitate extubation with fluid removal if we can decrease pressor support.  2. VDRF- per PCCM 3. C diff/s/p colectomy/iliostomy- per primary svc 4. SIRS- on pressors per PCCM 5. Hyperkalemia- improved with CVVHD. 6. Metabolic acidosis- as above.  Will decrease bicarb gtt 7. Hyponatremia- due to anasarca/protein malnutrition. Plan as above 8. Abnormal LFT's- ?shock liver 9. Leukocytosis- on abx. 10. Dispo- cont with aggressive measures for now. 11.   Alea Ryer A

## 2012-03-13 NOTE — Progress Notes (Signed)
  Echocardiogram 2D Echocardiogram has been performed.  Connor Small 03/13/2012, 1:00 PM

## 2012-03-13 NOTE — Procedures (Signed)
Central Venous Catheter Insertion Procedure Note Connor Small [id number] 1960-05-31  Procedure: Insertion of Central Venous Catheter Indications: Drug and/or fluid administration and Frequent blood sampling  Procedure Details Consent: Risks of procedure as well as the alternatives and risks of each were explained to the (patient/caregiver).  Consent for procedure obtained. Time Out: Verified patient identification, verified procedure, site/side was marked, verified correct patient position, special equipment/implants available, medications/allergies/relevent history reviewed, required imaging and test results available.  Performed  Maximum sterile technique was used including antiseptics, cap, gloves, gown, hand hygiene, mask and sheet. Skin prep: Chlorhexidine; local anesthetic administered A antimicrobial bonded/coated triple lumen catheter was placed in the left internal jugular vein using the Seldinger technique.  Evaluation Blood flow good Complications: No apparent complications Patient did tolerate procedure well. Chest X-ray ordered to verify placement.  CXR: pending.  Insertion of CVL was performed under ultrasound guidance.  Ultrasound was used to confirm cannulation of left IJ.  [place] 03/13/2012, 10:25 AM   Tolerated well [place] gudiance Hep off prior  Lavon Paganini. Titus Mould, MD, [place] Pgr: [facility]

## 2012-03-13 NOTE — Progress Notes (Signed)
Inpatient Diabetes Program Recommendations  AACE/ADA: New Consensus Statement on Inpatient Glycemic Control (2013)  Target Ranges:  Prepandial:   less than 140 mg/dL      Peak postprandial:   less than 180 mg/dL (1-2 hours)      Critically ill patients:  140 - 180 mg/dL   Reason for Visit: Hyperglycemia with tube feedings  Note:  Results for Connor Small, Connor Small (MRN [id number]) as of 03/13/2012 11:05  Ref. Range 03/12/2012 16:41 03/12/2012 20:54 03/13/2012 00:51 03/13/2012 03:45 03/13/2012 07:48  Glucose-Capillary Latest Range: 70-99 mg/dL 127 (H) 198 (H) 274 (H) 239 (H) 212 (H)   Started getting tube feeing of Vital AF 1.2 at 35 cc's/hour.  This tube feeding delivers 15.47 Gm CHO every 4 hours, which would be approximately 2 units Novolog every 4 hours as tube feed coverage. Thank you.  Daiki Dicostanzo S. Marcelline Mates, RN, CNS, CDE Inpatient Diabetes Program, team pager [phone]

## 2012-03-13 NOTE — Progress Notes (Addendum)
ANTICOAGULATION CONSULT NOTE - Follow Up Consult  Pharmacy Consult:  Heparin Indication:  +DVT  Allergies  Allergen Reactions  . Penicillins Other (See Comments)    Heart rate changes    Patient Measurements: Height: 5' 9"  (175.3 cm) Weight: 203 lb 14.8 oz (92.5 kg) IBW/kg (Calculated) : 70.7 Heparin Dosing Weight: 95kg  Vital Signs: Temp: 97 F (36.1 C) (02/18 0346) Temp src: Oral (02/18 0346) BP: 86/48 mmHg (02/18 0700) Pulse Rate: 99 (02/18 0700)  Labs:  Recent Labs  03/11/12 0620  03/12/12 0455 03/12/12 0850 03/12/12 1600 03/12/12 2130 03/13/12 0445  HGB 15.3  --  16.4  --   --   --  16.6  HCT 45.3  --  49.9  --   --   --  49.2  PLT 289  --  232  --   --   --  148*  APTT  --   --   --  68*  --   --  145*  LABPROT  --   --   --  19.6*  --   --   --   INR  --   --   --  1.72*  --   --   --   HEPARINUNFRC 1.41*  < > 0.53  --   --  0.47 0.38  CREATININE 2.35*  --  2.74*  --  2.54*  --  1.87*  < > = values in this interval not displayed.  Estimated Creatinine Clearance: 51.3 ml/min (by C-G formula based on Cr of 1.87).      Assessment: Patient remains on IV heparin for +DVT.  Heparin level therapeutic on 800 units/hr, but is currently on hold for A-line placement.  No bleeding reported but patient does have a small splenic hematoma.  Noted patient's platelets have decreased.   Goal of Therapy:  Heparin level 0.3-0.5 units/ml Monitor platelets by anticoagulation protocol: Yes Vanc trough 15-20 mcg/mL   Plan:  - F/U resume heparin post line placement - Daily HL / CBC - Vanc 1073m IV Q24H - Primaxin 2541mIV Q6H - Continue PO vanc, IV Flagyl, and micafungin as ordered - Monitor renal fxn, clinical course, CRRT interruptions, CBGs     Lincoln Ginley D. DaMina MarblePharmD, BCPS Pager:  [phone] 03/13/2012, 7:35 AM    ======================================  Addendum:  Heparin gtt resumed at 1200 noon today post line placement.  With patient's heparin level  being therapeutic and stable on 800 units/hr, will not check a confirmatory level today.  F/U AM labs.     Nakyra Bourn D. DaMina MarblePharmD, BCPS Pager:  [phone]/18/2014, 1:18 PM

## 2012-03-14 ENCOUNTER — Inpatient Hospital Stay (HOSPITAL_COMMUNITY): Payer: BC Managed Care – PPO

## 2012-03-14 LAB — CBC WITH DIFFERENTIAL/PLATELET
Basophils Relative: 2 % — ABNORMAL HIGH (ref 0–1)
Blasts: 0 %
Eosinophils Relative: 0 % (ref 0–5)
Eosinophils Relative: 0 % (ref 0–5)
HCT: 49.2 % (ref 39.0–52.0)
Hemoglobin: 16.6 g/dL (ref 13.0–17.0)
Lymphocytes Relative: 4 % — ABNORMAL LOW (ref 12–46)
MCV: 97 fL (ref 78.0–100.0)
Metamyelocytes Relative: 0 %
Monocytes Absolute: 1.7 10*3/uL — ABNORMAL HIGH (ref 0.1–1.0)
Monocytes Relative: 6 % (ref 3–12)
Neutro Abs: 23.6 10*3/uL — ABNORMAL HIGH (ref 1.7–7.7)
Neutro Abs: 26.1 10*3/uL — ABNORMAL HIGH (ref 1.7–7.7)
Neutrophils Relative %: 82 % — ABNORMAL HIGH (ref 43–77)
Neutrophils Relative %: 90 % — ABNORMAL HIGH (ref 43–77)
Platelets: 105 10*3/uL — ABNORMAL LOW (ref 150–400)
RBC: 5.12 MIL/uL (ref 4.22–5.81)
RBC: 5.05 MIL/uL (ref 4.22–5.81)
RDW: 21 % — ABNORMAL HIGH (ref 11.5–15.5)
WBC: 29 10*3/uL — ABNORMAL HIGH (ref 4.0–10.5)
nRBC: 22 /100 WBC — ABNORMAL HIGH

## 2012-03-14 LAB — GLUCOSE, CAPILLARY
Glucose-Capillary: 213 mg/dL — ABNORMAL HIGH (ref 70–99)
Glucose-Capillary: 196 mg/dL — ABNORMAL HIGH (ref 70–99)
Glucose-Capillary: 174 mg/dL — ABNORMAL HIGH (ref 70–99)

## 2012-03-14 LAB — COMPREHENSIVE METABOLIC PANEL
Albumin: 1.6 g/dL — ABNORMAL LOW (ref 3.5–5.2)
Alkaline Phosphatase: 603 U/L — ABNORMAL HIGH (ref 39–117)
BUN: 91 mg/dL — ABNORMAL HIGH (ref 6–23)
CO2: 22 mEq/L (ref 19–32)
Chloride: 97 mEq/L (ref 96–112)
Creatinine, Ser: 1.67 mg/dL — ABNORMAL HIGH (ref 0.50–1.35)
GFR calc Af Amer: 52 mL/min — ABNORMAL LOW (ref >90)
GFR calc non Af Amer: 45 mL/min — ABNORMAL LOW (ref >90)
Glucose, Bld: 224 mg/dL — ABNORMAL HIGH (ref 70–99)
Potassium: 4.5 mEq/L (ref 3.5–5.1)
Total Bilirubin: 13.9 mg/dL — ABNORMAL HIGH (ref 0.3–1.2)

## 2012-03-14 LAB — APTT
aPTT: 163 seconds — ABNORMAL HIGH (ref 24–37)
aPTT: 70 seconds — ABNORMAL HIGH (ref 24–37)
aPTT: 75 seconds — ABNORMAL HIGH (ref 24–37)

## 2012-03-14 LAB — RENAL FUNCTION PANEL
Albumin: 2.4 g/dL — ABNORMAL LOW (ref 3.5–5.2)
Calcium: 7.5 mg/dL — ABNORMAL LOW (ref 8.4–10.5)
GFR calc Af Amer: 55 mL/min — ABNORMAL LOW (ref >90)
GFR calc non Af Amer: 47 mL/min — ABNORMAL LOW (ref >90)
Phosphorus: 5.7 mg/dL — ABNORMAL HIGH (ref 2.3–4.6)
Potassium: 4.6 mEq/L (ref 3.5–5.1)
Sodium: 132 mEq/L — ABNORMAL LOW (ref 135–145)

## 2012-03-14 LAB — PHOSPHORUS: Phosphorus: 6 mg/dL — ABNORMAL HIGH (ref 2.3–4.6)

## 2012-03-14 LAB — POCT I-STAT 3, ART BLOOD GAS (G3+)
Bicarbonate: 22.5 mEq/L (ref 20.0–24.0)
Patient temperature: 98.6
pH, Arterial: 7.416 (ref 7.350–7.450)

## 2012-03-14 LAB — POCT I-STAT 3, VENOUS BLOOD GAS (G3P V)
O2 Saturation: 99 %
TCO2: 25 mmol/L (ref 0–100)
pCO2, Ven: 39 mmHg — ABNORMAL LOW (ref 45.0–50.0)
pO2, Ven: 164 mmHg — ABNORMAL HIGH (ref 30.0–45.0)

## 2012-03-14 LAB — PROTIME-INR
INR: 1.55 — ABNORMAL HIGH (ref 0.00–1.49)
Prothrombin Time: 18.1 seconds — ABNORMAL HIGH (ref 11.6–15.2)

## 2012-03-14 LAB — HEPATITIS PANEL, ACUTE: Hepatitis B Surface Ag: NEGATIVE

## 2012-03-14 MED ORDER — ARGATROBAN 50 MG/50ML IV SOLN
0.2500 ug/kg/min | INTRAVENOUS | Status: DC
  Administered 2012-03-14: 0.25 ug/kg/min via INTRAVENOUS
  Filled 2012-03-14 (×3): qty 50

## 2012-03-14 MED ORDER — PRO-STAT SUGAR FREE PO LIQD
60.0000 mL | Freq: Four times a day (QID) | ORAL | Status: DC
  Administered 2012-03-14 – 2012-03-15 (×4): 60 mL
  Administered 2012-03-16: 30 mL
  Filled 2012-03-14 (×10): qty 60

## 2012-03-14 MED ORDER — DOBUTAMINE IN D5W 4-5 MG/ML-% IV SOLN
2.5000 ug/kg/min | INTRAVENOUS | Status: DC
  Administered 2012-03-14: 2.5 ug/kg/min via INTRAVENOUS
  Filled 2012-03-14: qty 250

## 2012-03-14 MED ORDER — INSULIN GLARGINE 100 UNIT/ML ~~LOC~~ SOLN
40.0000 [IU] | Freq: Every day | SUBCUTANEOUS | Status: DC
  Administered 2012-03-14 – 2012-03-19 (×6): 40 [IU] via SUBCUTANEOUS

## 2012-03-14 MED ORDER — VITAL AF 1.2 CAL PO LIQD
1000.0000 mL | ORAL | Status: DC
  Administered 2012-03-14 – 2012-03-15 (×2): 1000 mL
  Filled 2012-03-14 (×3): qty 1000

## 2012-03-14 MED ORDER — ALBUMIN HUMAN 25 % IV SOLN
12.5000 g | Freq: Two times a day (BID) | INTRAVENOUS | Status: DC
  Filled 2012-03-14 (×2): qty 50

## 2012-03-14 MED ORDER — ALBUMIN HUMAN 25 % IV SOLN
25.0000 g | Freq: Two times a day (BID) | INTRAVENOUS | Status: AC
  Administered 2012-03-14 (×2): 25 g via INTRAVENOUS
  Filled 2012-03-14 (×2): qty 100

## 2012-03-14 NOTE — Progress Notes (Signed)
NUTRITION FOLLOW UP  Intervention:    Vital AF 1.2 at 20 ml/hr with Prostat liquid protein 60 ml 4 times daily to provide 1376 total kcals (71% of estimated kcals needs), 156 gm protein (100% of estimated protein needs), 389 ml of water  Continue liquid MVI daily  RD to follow for nutrition care plan  Nutrition Dx:   Inadequate oral intake related to inability to eat as evidenced by NPO status and s/p GI surgery, ongoing  Goal:   EN to provide 60-70% of estimated calorie needs (22-25 kcals/kg ideal body weight) and >/= 90% of estimated protein needs, based on ASPEN guidelines for permissive underfeeding in critically ill obese individuals, met  Monitor:   EN regimen & tolerance, respiratory status, weight, labs, I/O's  Assessment:   TPN discontinued 2/15.  Vital AF 1.2 formula infusing at 35 ml/hr via NGT with Prostat liquid protein 60 ml 3 times daily providing 1608 total kcals, 153 gm protein, 681 ml of free water.  Receiving liquid MVI daily.  Acute encephalopathy improved.  HD catheter inserted 2/17, CVVHD initiated.  Patient remains intubated on ventilator support MV: 11 Temp: 36.6  Height: Ht Readings from Last 1 Encounters:  02/26/12 5' 9"  (1.753 m)    Weight Status:   Wt Readings from Last 1 Encounters:  03/14/12 203 lb 11.3 oz (92.4 kg)    Re-estimated needs:  Kcal: 1930 (underfeeding goal: 1158-1350 kcal) Protein: >/= 145 gm  Fluid: >/= 2.0 L  Skin: abdominal surgical incisions   Diet Order: NPO   Intake/Output Summary (Last 24 hours) at 03/14/12 1349 Last data filed at 03/14/12 1300  Gross per 24 hour  Intake 3590.98 ml  Output   5208 ml  Net -1617.02 ml    Last BM: 2/19  Labs:   Recent Labs Lab 03/11/12 0620  03/13/12 0445 03/13/12 1630 03/14/12 0405  NA 133*  < > 132* 132* 130*  K 5.0  < > 4.6 4.6 4.5  CL 102  < > 91* 95* 97  CO2 19  < > 29 24 22   BUN 130*  < > 113* 100* 91*  CREATININE 2.35*  < > 1.87* 1.83* 1.67*  CALCIUM 7.2*  < >  7.0* 6.9* 7.0*  MG 2.5  --  2.4  --  2.6*  PHOS 5.6*  < > 6.3* 6.2* 6.0*  GLUCOSE 243*  < > 236* 235* 224*  < > = values in this interval not displayed.  CBG (last 3)   Recent Labs  03/14/12 0442 03/14/12 0748 03/14/12 1142  GLUCAP 204* 196* 174*    Scheduled Meds: . albumin human  25 g Intravenous Q12H  . antiseptic oral rinse  15 mL Mouth Rinse QID  . chlorhexidine  15 mL Mouth Rinse BID  . famotidine  20 mg Per Tube BID  . feeding supplement  60 mL Per Tube TID  . feeding supplement (VITAL AF 1.2 CAL)  1,000 mL Per Tube Q24H  . hydrocortisone sodium succinate  50 mg Intravenous Q6H  . imipenem-cilastatin  250 mg Intravenous Q6H  . insulin aspart  0-20 Units Subcutaneous Q4H  . insulin glargine  40 Units Subcutaneous Daily  . metronidazole  500 mg Intravenous Q8H  . micafungin (MYCAMINE) IV  100 mg Intravenous Daily  . multivitamin  5 mL Per Tube Daily  . sodium chloride  10-40 mL Intracatheter Q12H  . vancomycin  500 mg Oral Q6H  . vancomycin  1,000 mg Intravenous Q24H  Continuous Infusions: . sodium chloride Stopped (03/13/12 0800)  . sodium chloride 20 mL/hr at 03/13/12 1211  . argatroban 0.25 mcg/kg/min (03/14/12 1051)  . DOBUTamine 2.5 mcg/kg/min (03/14/12 0951)  . norepinephrine (LEVOPHED) Adult infusion 35 mcg/min (03/14/12 1138)  . dialysis replacement fluid (prismasate) 200 mL/hr at 03/14/12 1306  . dialysis replacement fluid (prismasate) 200 mL/hr at 03/14/12 1306  . dialysate (PRISMASATE) 1,500 mL/hr at 03/14/12 1307  . vasopressin (PITRESSIN) infusion - *FOR SHOCK* 0.03 Units/min (03/14/12 1307)    Arthur Holms, RD, LDN Pager #: [phone] After-Hours Pager #: [phone]

## 2012-03-14 NOTE — Progress Notes (Signed)
15 Days Post-Op  Subjective: Remains on the vent and on pressors but is awake, fairly alert Denies abdominal pain Tolerating tube feeds  Objective: Vital signs in last 24 hours: Temp:  [96.7 F (35.9 C)-98.2 F (36.8 C)] 97.4 F (36.3 C) (02/19 0435) Pulse Rate:  [82-132] 85 (02/19 0715) Resp:  [13-23] 15 (02/19 0715) BP: (62-128)/(14-76) 105/50 mmHg (02/19 0715) SpO2:  [100 %] 100 % (02/19 0715) Arterial Line BP: (87-128)/(49-68) 109/62 mmHg (02/19 0715) FiO2 (%):  [40 %] 40 % (02/19 0300) Weight:  [203 lb 11.3 oz (92.4 kg)] 203 lb 11.3 oz (92.4 kg) (02/19 0435) Last BM Date: 03/13/12  Intake/Output from previous day: 02/18 0701 - 02/19 0700 In: 4208.9 [I.V.:1638.9; NG/GT:920; IV Piggyback:1650] Out: 7322 [Urine:214; Drains:340; Stool:525] Intake/Output this shift:    Abdomen soft, non distended, ostomy productive No peritoneal signs Drains serosang Wound stable  Lab Results:   Recent Labs  03/13/12 0445 03/14/12 0405  WBC 28.7* 29.0*  HGB 16.6 16.0  HCT 49.2 49.0  PLT 148* 105*   BMET  Recent Labs  03/13/12 1630 03/14/12 0405  NA 132* 130*  K 4.6 4.5  CL 95* 97  CO2 24 22  GLUCOSE 235* 224*  BUN 100* 91*  CREATININE 1.83* 1.67*  CALCIUM 6.9* 7.0*   PT/INR  Recent Labs  03/13/12 0700 03/14/12 0405  LABPROT 17.8* 18.1*  INR 1.51* 1.55*   ABG  Recent Labs  03/13/12 0849 03/13/12 1701 03/14/12 0421  PHART 7.432 7.425  --   HCO3 29.3* 26.3* 24.1*    Studies/Results: US Abdomen Complete  03/12/2012  *RADIOLOGY REPORT*  Clinical Data:  52 year old male with recent colectomy.  Elevated bilirubin.  COMPLETE ABDOMINAL ULTRASOUND  Comparison:  CT abdomen and pelvis without contrast 03/11/2012 and earlier.  Findings:  Gallbladder:  Gallbladder wall thickening up to 6 mm.  No pericholecystic or peri hepatic fluid.  No sludge or stones. Sonographic Murphy's sign could not be evaluated due to decreased mental status.  Common bile duct:  Normal  measuring up to 4 mm diameter.  Liver:  Within normal limits.  No intrahepatic biliary ductal dilatation identified.  IVC:  Appears normal.  Pancreas:  Not visualized due to overlying bowel gas.  Spleen:  Continued perisplenic fluid which appears to be simple except for acute septations.  Mildly heterogeneous splenic echotexture.  Right Kidney:  Negative.  11.5 cm in length.  Left Kidney:  Could not be visualized due to overlying bowel gas.  Abdominal aorta:  Incompletely visualized due to overlying bowel gas, visualized portions within normal limits.  Other findings:  Right pleural effusion.  IMPRESSION: 1.  Gallbladder wall thickening in the absence of sludge or stones but no biliary ductal dilatation.  If there is concern for acalculous cholecystitis, nuclear medicine hepatobiliary scan would be most valuable. 2.  Continued left upper quadrant fluid adjacent to the spleen. Burtis Junes this is not subcapsular, but the distinction is difficult to make in this case.  The content of the fluid collection appears mostly simple aside from some septations. 3.  Right pleural effusion.   Original Report Authenticated By: Roselyn Reef, M.D.    Dg Chest Port 1 View  03/14/2012  *RADIOLOGY REPORT*  Clinical Data: Assess effusions.  PORTABLE CHEST - 1 VIEW  Comparison: 03/13/2012  Findings: Effusions have improved.  Minimal bibasilar atelectasis and small residual right effusion.  Support devices are unchanged. Heart is normal size.  IMPRESSION: Improving effusions.  Residual small right effusion and bibasilar atelectasis.  Original Report Authenticated By: Rolm Baptise, M.D.    Dg Chest Port 1 View  03/13/2012  *RADIOLOGY REPORT*  Clinical Data: New central venous catheter placement  PORTABLE CHEST - 1 VIEW  Comparison: 03/13/2012 at 0429 hours  Findings: Endotracheal tube terminates 6 cm above the carina.  Left IJ venous catheter terminates in the lower SVC.  Right IJ venous sheath terminates in the mid SVC.  Enteric tube  courses below the diaphragm.  No pneumothorax.  Suspected small right pleural effusion.  The heart is normal in size.  IMPRESSION: Endotracheal tube terminates 6 cm above the carina.  Additional support apparatus as above.  No pneumothorax is seen.   Original Report Authenticated By: Julian Hy, M.D.    Dg Chest Port 1 View  03/13/2012  *RADIOLOGY REPORT*  Clinical Data: Shortness of breath.  PORTABLE CHEST - 1 VIEW  Comparison: 03/12/2012.  Findings: The support apparatus is stable.  No complicating features.  Persistent right pleural effusion and bibasilar infiltrates.  IMPRESSION:  1.  Stable support apparatus. 2.  Persistent right effusion and bibasilar infiltrates.   Original Report Authenticated By: Marijo Sanes, M.D.    Dg Chest Port 1 View  03/12/2012  *RADIOLOGY REPORT*  Clinical Data: 52 year old male right IJ hemodialysis catheter placement.  Intubated.  PORTABLE CHEST - 1 VIEW  Comparison: 0539 hours the same day.  Findings: Right IJ dual lumen dialysis type catheter placed.  Tip at the level of the carina.  Endotracheal tube tip in good position between clavicles and carina.  Stable left IJ central line. Enteric tube courses to the abdomen.  Stable cardiac size and mediastinal contours.  No pneumothorax, pulmonary edema or consolidation.  Small right pleural effusion suspected.  IMPRESSION:  1.  Right IJ dual lumen dialysis catheter placed, tip at the SVC level.  No pneumothorax. 2. Otherwise, stable lines and tubes. 3.  Stable right pleural effusion.   Original Report Authenticated By: Roselyn Reef, M.D.     Anti-infectives: Anti-infectives   Start     Dose/Rate Route Frequency Ordered Stop   03/14/12 1000  micafungin (MYCAMINE) 100 mg in sodium chloride 0.9 % 100 mL IVPB     100 mg 100 mL/hr over 1 Hours Intravenous Daily 03/13/12 0632     03/13/12 1000  micafungin (MYCAMINE) 100 mg in sodium chloride 0.9 % 100 mL IVPB     100 mg 100 mL/hr over 1 Hours Intravenous Daily 03/13/12  0632 03/13/12 1311   03/12/12 1800  vancomycin (VANCOCIN) IVPB 1000 mg/200 mL premix     1,000 mg 200 mL/hr over 60 Minutes Intravenous Every 24 hours 03/12/12 0859     03/12/12 1000  imipenem-cilastatin (PRIMAXIN) 500 mg in sodium chloride 0.9 % 100 mL IVPB  Status:  Discontinued     500 mg 200 mL/hr over 30 Minutes Intravenous 3 times per day 03/12/12 0848 03/12/12 0909   03/12/12 1000  imipenem-cilastatin (PRIMAXIN) 250 mg in sodium chloride 0.9 % 100 mL IVPB     250 mg 200 mL/hr over 30 Minutes Intravenous 4 times per day 03/12/12 0909     03/11/12 1200  vancomycin (VANCOCIN) 750 mg in sodium chloride 0.9 % 150 mL IVPB  Status:  Discontinued     750 mg 150 mL/hr over 60 Minutes Intravenous Every 12 hours 03/11/12 0204 03/12/12 0858   03/11/12 0215  vancomycin (VANCOCIN) 750 mg in sodium chloride 0.9 % 150 mL IVPB     750 mg 150 mL/hr over 60  Minutes Intravenous  Once 03/11/12 0204 03/11/12 0352   03/11/12 0215  aztreonam (AZACTAM) 1 g in dextrose 5 % 50 mL IVPB  Status:  Discontinued     1 g 100 mL/hr over 30 Minutes Intravenous 3 times per day 03/11/12 0204 03/12/12 0851   03/05/12 2200  metroNIDAZOLE (FLAGYL) IVPB 500 mg    Comments:  First dose ASAP   500 mg 100 mL/hr over 60 Minutes Intravenous Every 8 hours 03/05/12 1346     03/02/12 1200  vancomycin (VANCOCIN) 50 mg/mL oral solution 500 mg     500 mg Oral 4 times per day 03/02/12 0913     03/01/12 1445  vancomycin (VANCOCIN) 50 mg/mL oral solution 500 mg  Status:  Discontinued     500 mg Oral 3 times per day 03/01/12 1340 03/02/12 0913   03/01/12 1400  aztreonam (AZACTAM) 1 g in dextrose 5 % 50 mL IVPB  Status:  Discontinued     1 g 100 mL/hr over 30 Minutes Intravenous 3 times per day 03/01/12 1356 03/02/12 1146   03/01/12 1300  vancomycin (VANCOCIN) 1,250 mg in sodium chloride 0.9 % 250 mL IVPB  Status:  Discontinued     1,250 mg 166.7 mL/hr over 90 Minutes Intravenous Every 12 hours 03/01/12 1127 03/02/12 1146   02/29/12  0600  ciprofloxacin (CIPRO) IVPB 400 mg     400 mg 200 mL/hr over 60 Minutes Intravenous On call to O.R. 02/28/12 1409 02/28/12 1438   02/28/12 2200  vancomycin (VANCOCIN) 500 mg in sodium chloride irrigation 0.9 % 60 mL ENEMA  Status:  Discontinued     500 mg Rectal 2 times daily 02/28/12 1504 02/28/12 1817   02/28/12 1200  vancomycin (VANCOCIN) 50 mg/mL oral solution 500 mg  Status:  Discontinued    Comments:  First dose ASAP   500 mg Per Tube 4 times per day 02/28/12 0814 02/28/12 1817   02/28/12 1000  vancomycin (VANCOCIN) IVPB 1000 mg/200 mL premix  Status:  Discontinued     1,000 mg 200 mL/hr over 60 Minutes Intravenous Every 12 hours 02/28/12 0845 03/01/12 1127   02/28/12 1000  vancomycin (VANCOCIN) 500 mg in sodium chloride irrigation 0.9 % 100 mL ENEMA  Status:  Discontinued     500 mg Rectal 2 times daily 02/28/12 0923 02/28/12 1504   02/27/12 1600  metroNIDAZOLE (FLAGYL) IVPB 500 mg  Status:  Discontinued     500 mg 100 mL/hr over 60 Minutes Intravenous Every 8 hours 02/27/12 1546 02/27/12 2000   02/27/12 1600  ciprofloxacin (CIPRO) IVPB 400 mg  Status:  Discontinued     400 mg 200 mL/hr over 60 Minutes Intravenous Every 12 hours 02/27/12 1547 02/27/12 2000   02/27/12 1545  metroNIDAZOLE (FLAGYL) IVPB 500 mg  Status:  Discontinued     500 mg 100 mL/hr over 60 Minutes Intravenous  Once 02/27/12 1535 02/28/12 0843   02/27/12 1545  vancomycin (VANCOCIN) 500 mg in sodium chloride 0.9 % 100 mL IVPB  Status:  Discontinued     500 mg 100 mL/hr over 60 Minutes Intravenous  Once 02/27/12 1535 02/27/12 1538   02/27/12 1545  ciprofloxacin (CIPRO) IVPB 400 mg  Status:  Discontinued     400 mg 200 mL/hr over 60 Minutes Intravenous  Once 02/27/12 1538 02/28/12 0843   02/27/12 1415  vancomycin (VANCOCIN) 500 mg in sodium chloride 0.9 % 100 mL IVPB     500 mg 100 mL/hr over 60 Minutes Intravenous  Once  02/27/12 1402 02/27/12 1411   02/27/12 1400  vancomycin (VANCOCIN) powder 500 mg  Status:   Discontinued     500 mg Other To Surgery 02/27/12 1359 02/27/12 1402   02/27/12 1200  metroNIDAZOLE (FLAGYL) IVPB 500 mg  Status:  Discontinued    Comments:  First dose ASAP   500 mg 100 mL/hr over 60 Minutes Intravenous Every 6 hours 02/27/12 0953 03/05/12 1346   02/27/12 1200  vancomycin (VANCOCIN) 50 mg/mL oral solution 250 mg  Status:  Discontinued    Comments:  First dose ASAP   250 mg Oral 4 times per day 02/27/12 0953 02/28/12 0814   02/26/12 1700  oseltamivir (TAMIFLU) capsule 75 mg     75 mg Oral  Once 02/26/12 1651 02/26/12 1715      Assessment/Plan: s/p Procedure(s): TOTAL COLECTOMY (N/A) ILEOSTOMY (N/A)   Continue tube feeds and wound care Will keep drains for now  LOS: 17 days    Woodward Klem A 03/14/2012

## 2012-03-14 NOTE — Progress Notes (Signed)
ANTICOAGULATION CONSULT NOTE - Follow Up Consult  Pharmacy Consult:  Heparin Indication:  +DVT  Allergies  Allergen Reactions  . Penicillins Other (See Comments)    Heart rate changes    Patient Measurements: Height: 5' 9"  (175.3 cm) Weight: 203 lb 11.3 oz (92.4 kg) IBW/kg (Calculated) : 70.7 Heparin Dosing Weight: 95kg  Vital Signs: Temp: 97.8 F (36.6 C) (02/19 0755) Temp src: Oral (02/19 0755) BP: 99/61 mmHg (02/19 0815) Pulse Rate: 92 (02/19 0815)  Labs:  Recent Labs  03/12/12 0455  03/12/12 0850  03/12/12 2130 03/13/12 0445 03/13/12 0700 03/13/12 1630 03/14/12 0405  HGB 16.4  --   --   --   --  16.6  --   --  16.0  HCT 49.9  --   --   --   --  49.2  --   --  49.0  PLT 232  --   --   --   --  148*  --   --  105*  APTT  --   < > 68*  --   --  145* 56*  --  163*  LABPROT  --   --  19.6*  --   --   --  17.8*  --  18.1*  INR  --   --  1.72*  --   --   --  1.51*  --  1.55*  HEPARINUNFRC 0.53  --   --   --  0.47 0.38  --   --  0.32  CREATININE 2.74*  --   --   < >  --  1.87*  --  1.83* 1.67*  < > = values in this interval not displayed.  Estimated Creatinine Clearance: 57.5 ml/min (by C-G formula based on Cr of 1.67).      Assessment: Patient remains on IV heparin for +DVT.  Heparin level therapeutic on 800 units/hr.  No bleeding reported but patient does have a small splenic hematoma.  Noted patient's platelets have decreased further.   Goal of Therapy:  Heparin level 0.3-0.5 units/ml Monitor platelets by anticoagulation protocol: Yes Vanc trough 15-20 mcg/mL   Plan:  - Heparin gtt at 800 units/hr - Daily HL / CBC - Vanc 1035m IV Q24H - Primaxin 2558mIV Q6H - Continue PO vanc, IV Flagyl, and micafungin as ordered - Monitor renal fxn, clinical course, CRRT interruption - F/U AC plans, insulin adjustment     Terryn Redner D. DaMina MarblePharmD, BCPS Pager:  [phone]/19/2014, 8:44 AM

## 2012-03-14 NOTE — Progress Notes (Signed)
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 Penicillins Other (See Comments)    Heart rate changes    Patient Measurements: Height: 5' 9"  (175.3 cm) Weight: 203 lb 11.3 oz (92.4 kg) IBW/kg (Calculated) : 70.7  Vital Signs: Temp: 97.2 F (36.2 C) (02/19 1545) Temp src: Oral (02/19 1545) BP: 85/57 mmHg (02/19 1700) Pulse Rate: 99 (02/19 1700)  Labs:  Recent Labs  03/12/12 0455  03/12/12 0850  03/12/12 2130 03/13/12 0445 03/13/12 0700 03/13/12 1630 03/14/12 0405 03/14/12 1230 03/14/12 1630  HGB 16.4  --   --   --   --  16.6  --   --  16.0  --   --   HCT 49.9  --   --   --   --  49.2  --   --  49.0  --   --   PLT 232  --   --   --   --  148*  --   --  105*  --   --   APTT  --   < > 68*  --   --  145* 56*  --  163* 70* 75*  LABPROT  --   --  19.6*  --   --   --  17.8*  --  18.1*  --   --   INR  --   --  1.72*  --   --   --  1.51*  --  1.55*  --   --   HEPARINUNFRC 0.53  --   --   --  0.47 0.38  --   --  0.32  --   --   CREATININE 2.74*  --   --   < >  --  1.87*  --  1.83* 1.67*  --   --   < > = values in this interval not displayed.  Estimated Creatinine Clearance: 57.5 ml/min (by C-G formula based on Cr of 1.67).        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  Patient with liver and renal dysfunction.  Two PTT are now therapeutic. Will check it q12hr  Goal of Therapy:  aPTT 50-90 seconds Monitor platelets by anticoagulation protocol: Yes    Plan:   Cont argatroban at current rate Check PTT q12hr

## 2012-03-14 NOTE — Progress Notes (Addendum)
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 Penicillins Other (See Comments)    Heart rate changes    Patient Measurements: Height: 5' 9"  (175.3 cm) Weight: 203 lb 11.3 oz (92.4 kg) IBW/kg (Calculated) : 70.7  Vital Signs: Temp: 97.8 F (36.6 C) (02/19 0755) Temp src: Oral (02/19 0755) BP: 97/67 mmHg (02/19 0830) Pulse Rate: 93 (02/19 0830)  Labs:  Recent Labs  03/12/12 0455  03/12/12 0850  03/12/12 2130 03/13/12 0445 03/13/12 0700 03/13/12 1630 03/14/12 0405  HGB 16.4  --   --   --   --  16.6  --   --  16.0  HCT 49.9  --   --   --   --  49.2  --   --  49.0  PLT 232  --   --   --   --  148*  --   --  105*  APTT  --   < > 68*  --   --  145* 56*  --  163*  LABPROT  --   --  19.6*  --   --   --  17.8*  --  18.1*  INR  --   --  1.72*  --   --   --  1.51*  --  1.55*  HEPARINUNFRC 0.53  --   --   --  0.47 0.38  --   --  0.32  CREATININE 2.74*  --   --   < >  --  1.87*  --  1.83* 1.67*  < > = values in this interval not displayed.  Estimated Creatinine Clearance: 57.5 ml/min (by C-G formula based on Cr of 1.67).        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  Patient with liver and renal dysfunction.  Her aPTT is currently elevated but she was on IV heparin (was 34 prior to heparin initiation).  Expect first aPTT to be elevated although will start at a lower argatroban dose.   Goal of Therapy:  aPTT 50-90 seconds Monitor platelets by anticoagulation protocol: Yes    Plan:  - Argatroban 0.25 mcg/kg/min - aPTT in 2 hrs    Shravan Salahuddin D. Mina Marble, PharmD, BCPS Pager:  [phone] - [phone] 03/14/2012, 9:40 AM    =======================  Addendum:  APTT = 70 sec (goal 50-90 sec), obtained ~3 hrs after gtt started   Assessment: Heparin gtt discontinued around 0900 today, so expect it to be cleared by now.  APTT  currently therapeutic, no bleeding reported.   Plan: - Recheck aPTT in 2 hrs - If second level therapeutic, will check aPTT Q12H thereafter    Jadynn Epping D. Mina Marble, PharmD, BCPS Pager:  [phone] 03/14/2012, 2:34 PM

## 2012-03-14 NOTE — Progress Notes (Addendum)
PULMONARY  / CRITICAL CARE MEDICINE  Name: Connor Small MRN: [id number] DOB: 12-Feb-1960    ADMISSION DATE:  02/26/2012 CONSULTATION DATE:  2/5  REFERRING MD :  Karilyn Cota  CHIEF COMPLAINT:  Acute resp failure   BRIEF PATIENT DESCRIPTION:  [name] admitted to [facility] for Cdiff on 2/2. Underwent decompressive cecostomy 2/3, initially was better, then worsened on 2/4 with increased abdominal distension and tenderness, decreased renal function.  He was taken back to the OR on 2/4 for total abdominal colectomy and end ileostomy.  Post op he developed ARDS and sepsis shock and was being treated for this.  The morning of 2/6 he had new onset of A-fib and transferred to [facility].    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 2/10- CT abdo /pelvis- small hematoma likely subcapsular spleen, JPs wnl 2/11-borderline BP, pos balance, tube feeds started 2/11 clot around picc, picc dc'ed 2/12-continued borderline BP, neg balance 1 liter 2/14- neo required, 1.2 liters neg, air hunger, changed to PS  2/15 -neg 4 l bal on lasix drip, lasix d/c  2/16- poor neurostatus, ct head neg 2/17- cvvhd, high pressor needs 2/18- some slight reduction in pressors  LINES / TUBES: ETT - OSH 2/4>>> Foley-OSH 2/3>>> PICC OSH 2/4>>>2/11 Art Line - OSH 2/5>>>out A line rt fem 2/18>>> Rt ij HD 2/17>>> Left IJ 2/18>>>  CULTURES: BCx2 2/4>>>negative BCx2 2/3>>>negative UC 2/3- Negative MRSA PCR 2/3- Negative C diff 2/6>>>Positive Body fluid 2/9>>>WBCs, no organisms 2/15 BC x 2 > 2/15 Sputum >>>NF  ANTIBIOTICS: Flagyl 2/3>>> Vanc 2/6 (IV) >>>2/7 azactam 2/6>>>2/7 Oral vanc 2/6>>> IV vanc  2/16 > Aztreonam 2/16 >2/17 Imipenem 2/17>>> mycofungin 2/18>>>  SUBJECTIVE:  Neg balance, levo to 20  VITAL SIGNS: Temp:  [96.7 F (35.9 C)-98.2 F (36.8 C)] 97.8 F (36.6 C) (02/19 0755) Pulse Rate:  [85-132] 93 (02/19 0830) Resp:  [13-23] 21 (02/19 0830) BP: (62-108)/(14-76) 97/67 mmHg (02/19 0830) SpO2:  [100 %] 100 % (02/19 0830) Arterial Line BP: (87-116)/(49-65) 102/58 mmHg (02/19 0830) FiO2 (%):  [40 %] 40 % (02/19 0814) Weight:  [92.4 kg (203 lb 11.3 oz)] 92.4 kg (203 lb 11.3 oz) (02/19 0435) HEMODYNAMICS: CVP:  [1 mmHg-3 mmHg] 1 mmHg VENTILATOR SETTINGS: Vent Mode:  [-] PRVC FiO2 (%):  [40 %] 40 % Set Rate:  [14 bmp] 14 bmp Vt Set:  [600 mL] 600 mL PEEP:  [5 cmH20] 5 cmH20 Plateau Pressure:  [14 cmH20-16 cmH20] 16 cmH20 INTAKE / OUTPUT: Intake/Output     02/18 0701 - 02/19 0700 02/19 0701 - 02/20 0700   I.V. (mL/kg) 1638.9 (17.7) 69.8 (0.8)   NG/GT 920 35   IV Piggyback 1650    Total Intake(mL/kg) 4208.9 (45.6) 104.8 (1.1)   Urine (mL/kg/hr) 214 (0.1) 42 (0.2)   Drains 340 (0.2)    Other 3794 (1.7) 182 (1)   Stool 525 (0.2)    Total Output 4873 224   Net -664.1 -119.2          PHYSICAL EXAMINATION: General lethargic, follows simple commands Neuro: awake, cooperative, moves rt ext but weaker then left HEENT: ETT in place Cardiovascular: irregular s1 s 2 Lungs: diminshed  BS in bases, anterior clear  Abdomen: Distended, wound site clean, hypoBS, ostomy unchanged Musculoskeletal: 1+ edema Skin: no rashes  LABS:  Recent Labs Lab 03/11/12 0300 03/11/12 0620 03/12/12 0455  03/12/12 0850  03/13/12 0445 03/13/12 0700 03/13/12 0849 03/13/12 1630 03/13/12 1701 03/14/12 0405  HGB  --  15.3 16.4  --   --   --  16.6  --   --   --   --  16.0  WBC  --  41.2* 39.4*  --   --   --  28.7*  --   --   --   --  29.0*  PLT  --  289 232  --   --   --  148*  --   --   --   --  105*  NA  --  133* 131*  --   --   < > 132*  --   --  132*  --  130*  K  --  5.0  5.8*  --   --   < > 4.6  --   --  4.6  --  4.5  CL  --  102 99  --   --   < > 91*  --   --  95*  --  97  CO2  --  19 17*  --   --   < > 29  --   --  24  --  22  GLUCOSE  --  243* 143*  --   --   < > 236*  --   --  235*  --  224*  BUN  --  130* 158*  --   --   < > 113*  --   --  100*  --  91*  CREATININE  --  2.35* 2.74*  --   --   < > 1.87*  --   --  1.83*  --  1.67*  CALCIUM  --  7.2* 7.0*  --   --   < > 7.0*  --   --  6.9*  --  7.0*  MG  --  2.5  --   --   --   --  2.4  --   --   --   --  2.6*  PHOS  --  5.6*  --   --   --   < > 6.3*  --   --  6.2*  --  6.0*  AST  --  129* 117*  --   --   --  82*  --   --   --   --  75*  ALT  --  55* 61*  --   --   --  62*  --   --   --   --  59*  ALKPHOS  --  710* 639*  --   --   --  680*  --   --   --   --  603*  BILITOT  --  3.9* 7.0*  --   --   --  13.2* 13.9*  --   --   --  13.9*  PROT  --  4.1* 4.4*  --   --   --  4.6*  --   --   --   --  4.6*  ALBUMIN  --  1.3* 1.4*  --   --   < > 1.6*  --   --  1.5*  --  1.6*  APTT  --   --   --   < > 68*  --  145* 56*  --   --   --  163*  INR  --   --   --   --  1.72*  --   --  1.51*  --   --   --  1.55*  LATICACIDVEN  --   --   --   --   --   --   --  3.3*  --   --   --   --   PROCALCITON  --  4.22 5.70  --   --   --   --   --   --   --   --   --   PHART 7.391  --   --   --   --   --   --   --  7.432  --  7.425  --   PCO2ART 25.9*  --   --   --   --   --   --   --  43.8  --  39.8  --   PO2ART 145.0*  --   --   --   --   --   --   --  163.0*  --  153.0*  --   < > = values in this interval not displayed.  Recent Labs Lab 03/13/12 1716 03/13/12 2006 03/14/12 0034 03/14/12 0442 03/14/12 0748  GLUCAP 215* 213* 221* 204* 196*    Imaging: US Abdomen Complete  03/12/2012  *RADIOLOGY REPORT*  Clinical Data:  52 year old male with recent colectomy.  Elevated bilirubin.  COMPLETE ABDOMINAL ULTRASOUND  Comparison:  CT abdomen and pelvis without contrast 03/11/2012 and earlier.  Findings:  Gallbladder:  Gallbladder  wall thickening up to 6 mm.  No pericholecystic or peri hepatic fluid.  No sludge or stones. Sonographic Murphy's sign could not be evaluated due to decreased mental status.  Common bile duct:  Normal measuring up to 4 mm diameter.  Liver:  Within normal limits.  No intrahepatic biliary ductal dilatation identified.  IVC:  Appears normal.  Pancreas:  Not visualized due to overlying bowel gas.  Spleen:  Continued perisplenic fluid which appears to be simple except for acute septations.  Mildly heterogeneous splenic echotexture.  Right Kidney:  Negative.  11.5 cm in length.  Left Kidney:  Could not be visualized due to overlying bowel gas.  Abdominal aorta:  Incompletely visualized due to overlying bowel gas, visualized portions within normal limits.  Other findings:  Right pleural effusion.  IMPRESSION: 1.  Gallbladder wall thickening in the absence of sludge or stones but no biliary ductal dilatation.  If there is concern for acalculous cholecystitis, nuclear medicine hepatobiliary scan would be most valuable. 2.  Continued left upper quadrant fluid adjacent to the spleen. Legrand Rams this is not subcapsular, but the distinction is difficult to make in this case.  The content of the fluid collection appears mostly simple aside from some septations. 3.  Right pleural effusion.   Original Report Authenticated By: Erskine Speed, M.D.    Dg Chest Port 1 View  03/14/2012  *RADIOLOGY REPORT*  Clinical Data: Assess effusions.  PORTABLE CHEST - 1 VIEW  Comparison: 03/13/2012  Findings: Effusions have improved.  Minimal bibasilar atelectasis and small residual right effusion.  Support devices are unchanged. Heart is normal size.  IMPRESSION: Improving effusions.  Residual small right effusion and bibasilar atelectasis.   Original Report Authenticated  By: Charlett Nose, M.D.    Dg Chest Port 1 View  03/13/2012  *RADIOLOGY REPORT*  Clinical Data: New central venous catheter placement  PORTABLE CHEST - 1 VIEW  Comparison:  03/13/2012 at 0429 hours  Findings: Endotracheal tube terminates 6 cm above the carina.  Left IJ venous catheter terminates in the lower SVC.  Right IJ venous sheath terminates in the mid SVC.  Enteric tube courses below the diaphragm.  No pneumothorax.  Suspected small right pleural effusion.  The heart is normal in size.  IMPRESSION: Endotracheal tube terminates 6 cm above the carina.  Additional support apparatus as above.  No pneumothorax is seen.   Original Report Authenticated By: Charline Bills, M.D.    Dg Chest Port 1 View  03/13/2012  *RADIOLOGY REPORT*  Clinical Data: Shortness of breath.  PORTABLE CHEST - 1 VIEW  Comparison: 03/12/2012.  Findings: The support apparatus is stable.  No complicating features.  Persistent right pleural effusion and bibasilar infiltrates.  IMPRESSION:  1.  Stable support apparatus. 2.  Persistent right effusion and bibasilar infiltrates.   Original Report Authenticated By: Rudie Meyer, M.D.    Dg Chest Port 1 View  03/12/2012  *RADIOLOGY REPORT*  Clinical Data: 51 year old male right IJ hemodialysis catheter placement.  Intubated.  PORTABLE CHEST - 1 VIEW  Comparison: 0539 hours the same day.  Findings: Right IJ dual lumen dialysis type catheter placed.  Tip at the level of the carina.  Endotracheal tube tip in good position between clavicles and carina.  Stable left IJ central line. Enteric tube courses to the abdomen.  Stable cardiac size and mediastinal contours.  No pneumothorax, pulmonary edema or consolidation.  Small right pleural effusion suspected.  IMPRESSION:  1.  Right IJ dual lumen dialysis catheter placed, tip at the SVC level.  No pneumothorax. 2. Otherwise, stable lines and tubes. 3.  Stable right pleural effusion.   Original Report Authenticated By: Erskine Speed, M.D.    CXR 2/18 >basiar effusion rt greater left, ett wnl, line wnl ASSESSMENT / PLAN:  PULMONARY A: Acute respiratory failure in setting of septic shock and volume overload.  Lt  pleural effusion.   P:   Can restart wean cpap 5 ps 5, goal 30 min abg reviewed, continue current MV cotninue even  balance Holding off trach until pressors better pcxr in am Continue cvvhd abg now  CARDIOVASCULAR A: Septic Shock - Secondary to C-diff colitis; off pressors 2/08. Restarted 2/13 (relatd to volume?) Atrial Fibrillation with RVR.> Low [place] score. High output from JP's Clot around picc, dvt rue 2/11 Shock reoccurrence, related to sepsis? Volume? 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  See ID Levophed to MAp goal 55 and good MS as goal Full dose stress steroids 50 q6h  Vasopressin to remain Consider a line, accuracy in ? 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 Hx of ulcerative colitis. HIGH output from JP's ?ostomy status 2/16 TNA d/c 2/15 w/ TF near goal , elevated alk phos persists, cholestatic picture  P:   Post-op care  per [facility] pepcid Avoid tpn, bili etc Fra tionat ebili Ldh, lactic acid concerning, surgery following Acute hep panel pending [place] ruq, assess gallbaldder, HIDA Surgery considering CT enterocgraphy if declines  HEMATOLOGIC A: Leukocytosis Thrombocytopenia. Resolved  dvt picc aassociated rue 2/11 Small splenic hematoma Thrombocytopenia r/o HITT P:  Dc heparin, send HITT assay Start argatroban , way dose adjusted down for liver and critical illness Dopplers legs and lue to ensure  INFECTIOUS A: C diff colitis, r/o line, r/o ischemia bowel driving? P:   Continue enteral vancomycin and  IV flagyl  Vanc IV imipenem All lines are new Assess hida, [place] ruq Continue empiric myco empric Follow repeat BC- NGTD 15th  ENDOCRINE A: Hyperglycemia. uncontrolled Relative adrenal insufficiency. P:   SSI Lantus to increase 40 Solucortef 50 q12 to q6h  NEUROLOGIC A:  Acute encephalopathy IMproved daily, severe debilliattion 2/16: not moving right side , lethargic , follows simple commands  P:   Improved Will need MRI brain, still some weakness rue, at risk embolic cva Int fent Avoid versed Support MAp   I have interviewed and examined the patient and reviewed the database. I have formulated the assessment and plan as reflected in the note above with amendments made by me. 40 mins of direct critical care time provided.   Mcarthur Rossetti. Tyson Alias, MD, FACP Pgr: [phone] [facility]

## 2012-03-14 NOTE — Progress Notes (Signed)
Patient ID: Connor Small, male   DOB: 20-Mar-1960, 52 y.o.   MRN: [id number]  If his ultrasound suggests cholecysititis, will talk to IR about a percutaneous cholecystostomy tube.  They may still want a HIDA first.  Given his abdominal exam, ostomy viability, tolerance of tube feeds, normal pH, and recent CT scan, I just don't think he has any ischemic bowel.  It would be extremely risky to explore him currently especially three weeks post op.

## 2012-03-14 NOTE — Progress Notes (Signed)
Patient ID: Connor Small, male   DOB: 1960-07-23, 52 y.o.   MRN: [id number] S:intubated, awake  O:BP 97/67  Pulse 93  Temp(Src) 97.8 F (36.6 C) (Oral)  Resp 21  Ht 5\' 9"  (1.753 m)  Wt 92.4 kg (203 lb 11.3 oz)  BMI 30.07 kg/m2  SpO2 100%  Intake/Output Summary (Last 24 hours) at 03/14/12 0857 Last data filed at 03/14/12 0800  Gross per 24 hour  Intake 3452.83 ml  Output   4987 ml  Net -1534.17 ml   Intake/Output: I/O last 3 completed shifts: In: 6038.6 [I.V.:2473.6; NG/GT:1515; IV Piggyback:2050] Out: 7333 [Urine:474; Drains:565; Other:5219; Stool:1075]  Intake/Output this shift:  Total I/O In: 104.8 [I.V.:69.8; NG/GT:35] Out: 224 [Urine:42; Other:182] Weight change: -0.1 kg (-3.5 oz) NWG:NFAOZHYQMV ill-appearing CVS:no rub Resp:decreased BS at bases HQI:ONGEXB with stool in bag Ext:+edema   Recent Labs Lab 03/08/12 0405  03/09/12 0530  03/10/12 0534 03/10/12 1710 03/11/12 0620 03/12/12 0455 03/12/12 1600 03/13/12 0445 03/13/12 1630 03/14/12 0405  NA 135  < > 131*  < > 134* 131* 133* 131* 134* 132* 132* 130*  K 5.0  < > 4.3  < > 4.8 5.3* 5.0 5.8* 5.1 4.6 4.6 4.5  CL 101  < > 101  < > 102 106 102 99 98 91* 95* 97  CO2 25  < > 23  < > 20 22 19  17* 22 29 24 22   GLUCOSE 171*  < > 234*  < > 184* 238* 243* 143* 139* 236* 235* 224*  BUN 84*  < > 91*  < > 104* 115* 130* 158* 148* 113* 100* 91*  CREATININE 1.52*  < > 1.70*  < > 2.04* 2.26* 2.35* 2.74* 2.54* 1.87* 1.83* 1.67*  ALBUMIN 1.1*  --   --   --  1.1*  --  1.3* 1.4* 1.5* 1.6* 1.5* 1.6*  CALCIUM 6.6*  < > 6.7*  < > 6.8* 5.7* 7.2* 7.0* 6.8* 7.0* 6.9* 7.0*  PHOS 3.8  --  4.1  --   --  5.6* 5.6*  --  7.4* 6.3* 6.2* 6.0*  AST 49*  --   --   --  117*  --  129* 117*  --  82*  --  75*  ALT 17  --   --   --  41  --  55* 61*  --  62*  --  59*  < > = values in this interval not displayed. Liver Function Tests:  Recent Labs Lab 03/12/12 0455  03/13/12 0445 03/13/12 0700 03/13/12 1630 03/14/12 0405  AST 117*  --   82*  --   --  75*  ALT 61*  --  62*  --   --  59*  ALKPHOS 639*  --  680*  --   --  603*  BILITOT 7.0*  --  13.2* 13.9*  --  13.9*  PROT 4.4*  --  4.6*  --   --  4.6*  ALBUMIN 1.4*  < > 1.6*  --  1.5* 1.6*  < > = values in this interval not displayed. No results found for this basename: LIPASE, AMYLASE,  in the last 168 hours No results found for this basename: AMMONIA,  in the last 168 hours CBC:  Recent Labs Lab 03/10/12 0534 03/11/12 0620 03/12/12 0455 03/13/12 0445 03/14/12 0405  WBC 42.2* 41.2* 39.4* 28.7* 29.0*  NEUTROABS 34.2*  --   --  23.6* 26.1*  HGB 15.3 15.3 16.4 16.6 16.0  HCT 43.7  45.3 49.9 49.2 49.0  MCV 94.8 96.6 97.7 96.1 97.0  PLT 317 289 232 148* 105*   Cardiac Enzymes: No results found for this basename: CKTOTAL, CKMB, CKMBINDEX, TROPONINI,  in the last 168 hours CBG:  Recent Labs Lab 03/13/12 1716 03/13/12 2006 03/14/12 0034 03/14/12 0442 03/14/12 0748  GLUCAP 215* 213* 221* 204* 196*    Iron Studies: No results found for this basename: IRON, TIBC, TRANSFERRIN, FERRITIN,  in the last 72 hours Studies/Results: US Abdomen Complete  03/12/2012  *RADIOLOGY REPORT*  Clinical Data:  52 year old male with recent colectomy.  Elevated bilirubin.  COMPLETE ABDOMINAL ULTRASOUND  Comparison:  CT abdomen and pelvis without contrast 03/11/2012 and earlier.  Findings:  Gallbladder:  Gallbladder wall thickening up to 6 mm.  No pericholecystic or peri hepatic fluid.  No sludge or stones. Sonographic Murphy's sign could not be evaluated due to decreased mental status.  Common bile duct:  Normal measuring up to 4 mm diameter.  Liver:  Within normal limits.  No intrahepatic biliary ductal dilatation identified.  IVC:  Appears normal.  Pancreas:  Not visualized due to overlying bowel gas.  Spleen:  Continued perisplenic fluid which appears to be simple except for acute septations.  Mildly heterogeneous splenic echotexture.  Right Kidney:  Negative.  11.5 cm in length.  Left  Kidney:  Could not be visualized due to overlying bowel gas.  Abdominal aorta:  Incompletely visualized due to overlying bowel gas, visualized portions within normal limits.  Other findings:  Right pleural effusion.  IMPRESSION: 1.  Gallbladder wall thickening in the absence of sludge or stones but no biliary ductal dilatation.  If there is concern for acalculous cholecystitis, nuclear medicine hepatobiliary scan would be most valuable. 2.  Continued left upper quadrant fluid adjacent to the spleen. Legrand Rams this is not subcapsular, but the distinction is difficult to make in this case.  The content of the fluid collection appears mostly simple aside from some septations. 3.  Right pleural effusion.   Original Report Authenticated By: Erskine Speed, M.D.    Dg Chest Port 1 View  03/14/2012  *RADIOLOGY REPORT*  Clinical Data: Assess effusions.  PORTABLE CHEST - 1 VIEW  Comparison: 03/13/2012  Findings: Effusions have improved.  Minimal bibasilar atelectasis and small residual right effusion.  Support devices are unchanged. Heart is normal size.  IMPRESSION: Improving effusions.  Residual small right effusion and bibasilar atelectasis.   Original Report Authenticated By: Charlett Nose, M.D.    Dg Chest Port 1 View  03/13/2012  *RADIOLOGY REPORT*  Clinical Data: New central venous catheter placement  PORTABLE CHEST - 1 VIEW  Comparison: 03/13/2012 at 0429 hours  Findings: Endotracheal tube terminates 6 cm above the carina.  Left IJ venous catheter terminates in the lower SVC.  Right IJ venous sheath terminates in the mid SVC.  Enteric tube courses below the diaphragm.  No pneumothorax.  Suspected small right pleural effusion.  The heart is normal in size.  IMPRESSION: Endotracheal tube terminates 6 cm above the carina.  Additional support apparatus as above.  No pneumothorax is seen.   Original Report Authenticated By: Charline Bills, M.D.    Dg Chest Port 1 View  03/13/2012  *RADIOLOGY REPORT*  Clinical Data:  Shortness of breath.  PORTABLE CHEST - 1 VIEW  Comparison: 03/12/2012.  Findings: The support apparatus is stable.  No complicating features.  Persistent right pleural effusion and bibasilar infiltrates.  IMPRESSION:  1.  Stable support apparatus. 2.  Persistent right effusion and  bibasilar infiltrates.   Original Report Authenticated By: Rudie Meyer, M.D.    Dg Chest Port 1 View  03/12/2012  *RADIOLOGY REPORT*  Clinical Data: 52 year old male right IJ hemodialysis catheter placement.  Intubated.  PORTABLE CHEST - 1 VIEW  Comparison: 0539 hours the same day.  Findings: Right IJ dual lumen dialysis type catheter placed.  Tip at the level of the carina.  Endotracheal tube tip in good position between clavicles and carina.  Stable left IJ central line. Enteric tube courses to the abdomen.  Stable cardiac size and mediastinal contours.  No pneumothorax, pulmonary edema or consolidation.  Small right pleural effusion suspected.  IMPRESSION:  1.  Right IJ dual lumen dialysis catheter placed, tip at the SVC level.  No pneumothorax. 2. Otherwise, stable lines and tubes. 3.  Stable right pleural effusion.   Original Report Authenticated By: Erskine Speed, M.D.    . antiseptic oral rinse  15 mL Mouth Rinse QID  . chlorhexidine  15 mL Mouth Rinse BID  . famotidine  20 mg Per Tube BID  . feeding supplement  60 mL Per Tube TID  . feeding supplement (VITAL AF 1.2 CAL)  1,000 mL Per Tube Q24H  . hydrocortisone sodium succinate  50 mg Intravenous Q6H  . imipenem-cilastatin  250 mg Intravenous Q6H  . insulin aspart  0-20 Units Subcutaneous Q4H  . insulin glargine  35 Units Subcutaneous Daily  . metronidazole  500 mg Intravenous Q8H  . micafungin (MYCAMINE) IV  100 mg Intravenous Daily  . multivitamin  5 mL Per Tube Daily  . sodium chloride  10-40 mL Intracatheter Q12H  . vancomycin  500 mg Oral Q6H  . vancomycin  1,000 mg Intravenous Q24H    BMET    Component Value Date/Time   NA 130* 03/14/2012 0405   K 4.5  03/14/2012 0405   CL 97 03/14/2012 0405   CO2 22 03/14/2012 0405   GLUCOSE 224* 03/14/2012 0405   BUN 91* 03/14/2012 0405   CREATININE 1.67* 03/14/2012 0405   CALCIUM 7.0* 03/14/2012 0405   GFRNONAA 45* 03/14/2012 0405   GFRAA 52* 03/14/2012 0405   CBC    Component Value Date/Time   WBC 29.0* 03/14/2012 0405   RBC 5.05 03/14/2012 0405   HGB 16.0 03/14/2012 0405   HCT 49.0 03/14/2012 0405   PLT 105* 03/14/2012 0405   MCV 97.0 03/14/2012 0405   MCH 31.7 03/14/2012 0405   MCHC 32.7 03/14/2012 0405   RDW 21.0* 03/14/2012 0405   LYMPHSABS 1.2 03/14/2012 0405   MONOABS 1.7* 03/14/2012 0405   EOSABS 0.0 03/14/2012 0405   BASOSABS 0.0 03/14/2012 0405    Assessment/Plan:  1. ARF- oliguric. Likely ischemic ATN in light of intravascular volume depletion, hypotension, and ongoing pressors. Currently on CVVHD, however not able to tolerate UF due to hypotension. Agree with IV albumin to see if this will help facilitate UF and weaning of pressors.  2. VDRF- per PCCM 3. C diff/s/p colectomy/iliostomy- per primary svc 4. SIRS- on pressors per PCCM 5. Hyperkalemia- improved with CVVHD. 6. Metabolic acidosis- as above. Will decrease bicarb gtt 7. Hyponatremia- due to anasarca/protein malnutrition. Plan as above on hydrocortisone. 8. Abnormal LFT's- ?shock liver with rising LDH worrisome for ischemic bowel or visceral necrosis. Surgery following 9. Leukocytosis- on abx. 10. Anasarca- difficult to remove fluid but losing quite a bit through drains.  Agree with albumin. 11. Dispo- cont with aggressive measures for now. 12.   Chassidy Layson A

## 2012-03-15 ENCOUNTER — Inpatient Hospital Stay (HOSPITAL_COMMUNITY): Payer: BC Managed Care – PPO

## 2012-03-15 DIAGNOSIS — I480 Paroxysmal atrial fibrillation: Secondary | ICD-10-CM

## 2012-03-15 DIAGNOSIS — R609 Edema, unspecified: Secondary | ICD-10-CM

## 2012-03-15 DIAGNOSIS — I4891 Unspecified atrial fibrillation: Secondary | ICD-10-CM | POA: Insufficient documentation

## 2012-03-15 DIAGNOSIS — D72829 Elevated white blood cell count, unspecified: Secondary | ICD-10-CM

## 2012-03-15 HISTORY — DX: Paroxysmal atrial fibrillation: I48.0

## 2012-03-15 LAB — POCT I-STAT 3, ART BLOOD GAS (G3+)
Acid-Base Excess: 1 mmol/L (ref 0.0–2.0)
Acid-Base Excess: 1 mmol/L (ref 0.0–2.0)
Bicarbonate: 23.8 mEq/L (ref 20.0–24.0)
O2 Saturation: 100 %
Patient temperature: 97.2
Patient temperature: 98
TCO2: 26 mmol/L (ref 0–100)
pCO2 arterial: 36.8 mmHg (ref 35.0–45.0)

## 2012-03-15 LAB — CBC WITH DIFFERENTIAL/PLATELET
Basophils Relative: 1 % (ref 0–1)
HCT: 38.3 % — ABNORMAL LOW (ref 39.0–52.0)
Hemoglobin: 12.4 g/dL — ABNORMAL LOW (ref 13.0–17.0)
Lymphocytes Relative: 4 % — ABNORMAL LOW (ref 12–46)
Monocytes Relative: 9 % (ref 3–12)
Neutro Abs: 23.3 10*3/uL — ABNORMAL HIGH (ref 1.7–7.7)
Neutrophils Relative %: 86 % — ABNORMAL HIGH (ref 43–77)
RBC: 3.93 MIL/uL — ABNORMAL LOW (ref 4.22–5.81)
WBC: 27.1 10*3/uL — ABNORMAL HIGH (ref 4.0–10.5)

## 2012-03-15 LAB — RENAL FUNCTION PANEL
BUN: 91 mg/dL — ABNORMAL HIGH (ref 6–23)
CO2: 23 mEq/L (ref 19–32)
Chloride: 100 mEq/L (ref 96–112)
Creatinine, Ser: 1.97 mg/dL — ABNORMAL HIGH (ref 0.50–1.35)
Glucose, Bld: 116 mg/dL — ABNORMAL HIGH (ref 70–99)
Potassium: 4.3 mEq/L (ref 3.5–5.1)

## 2012-03-15 LAB — CBC
Hemoglobin: 10.7 g/dL — ABNORMAL LOW (ref 13.0–17.0)
MCH: 32.2 pg (ref 26.0–34.0)
MCHC: 32.8 g/dL (ref 30.0–36.0)
Platelets: 34 10*3/uL — ABNORMAL LOW (ref 150–400)

## 2012-03-15 LAB — CULTURE, BLOOD (ROUTINE X 2): Culture: NO GROWTH

## 2012-03-15 LAB — GLUCOSE, CAPILLARY
Glucose-Capillary: 122 mg/dL — ABNORMAL HIGH (ref 70–99)
Glucose-Capillary: 127 mg/dL — ABNORMAL HIGH (ref 70–99)
Glucose-Capillary: 150 mg/dL — ABNORMAL HIGH (ref 70–99)
Glucose-Capillary: 152 mg/dL — ABNORMAL HIGH (ref 70–99)

## 2012-03-15 LAB — COMPREHENSIVE METABOLIC PANEL
BUN: 78 mg/dL — ABNORMAL HIGH (ref 6–23)
Calcium: 7.8 mg/dL — ABNORMAL LOW (ref 8.4–10.5)
Creatinine, Ser: 1.58 mg/dL — ABNORMAL HIGH (ref 0.50–1.35)
GFR calc Af Amer: 56 mL/min — ABNORMAL LOW (ref >90)
GFR calc non Af Amer: 48 mL/min — ABNORMAL LOW (ref >90)
Glucose, Bld: 143 mg/dL — ABNORMAL HIGH (ref 70–99)
Sodium: 135 mEq/L (ref 135–145)
Total Protein: 4.6 g/dL — ABNORMAL LOW (ref 6.0–8.3)

## 2012-03-15 MED ORDER — ARGATROBAN 50 MG/50ML IV SOLN
0.1300 ug/kg/min | INTRAVENOUS | Status: DC
  Filled 2012-03-15: qty 50

## 2012-03-15 MED ORDER — ARGATROBAN 50 MG/50ML IV SOLN
0.1800 ug/kg/min | INTRAVENOUS | Status: DC
  Filled 2012-03-15: qty 50

## 2012-03-15 MED ORDER — ARGATROBAN 50 MG/50ML IV SOLN
0.2500 ug/kg/min | INTRAVENOUS | Status: DC
  Administered 2012-03-15: 0.25 ug/kg/min via INTRAVENOUS
  Filled 2012-03-15: qty 50

## 2012-03-15 MED ORDER — ALBUMIN HUMAN 25 % IV SOLN
25.0000 g | Freq: Two times a day (BID) | INTRAVENOUS | Status: AC
  Administered 2012-03-15 (×2): 25 g via INTRAVENOUS
  Filled 2012-03-15 (×2): qty 100

## 2012-03-15 MED ORDER — ANTICOAGULANT SODIUM CITRATE 4% (200MG/5ML) IV SOLN
5.0000 mL | Status: DC | PRN
  Administered 2012-03-15: 2.4 mL via INTRAVENOUS
  Filled 2012-03-15 (×2): qty 250

## 2012-03-15 MED ORDER — LACTULOSE 10 GM/15ML PO SOLN
30.0000 g | Freq: Two times a day (BID) | ORAL | Status: DC
  Administered 2012-03-15 – 2012-03-20 (×12): 30 g
  Filled 2012-03-15 (×14): qty 45

## 2012-03-15 NOTE — Progress Notes (Signed)
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 Penicillins Other (See Comments)    Heart rate changes    Patient Measurements: Height: 5' 9"  (175.3 cm) Weight: 195 lb 8.8 oz (88.7 kg) IBW/kg (Calculated) : 70.7  Vital Signs: BP: 93/53 mmHg (02/20 1400) Pulse Rate: 83 (02/20 1445)  Labs:  Recent Labs  03/12/12 2130  03/13/12 0445 03/13/12 0700  03/14/12 0405  03/14/12 1600 03/14/12 1630 03/15/12 0450 03/15/12 0501 03/15/12 1600  HGB  --   < > 16.6  --   --  16.0  --   --   --  12.4*  --   --   HCT  --   --  49.2  --   --  49.0  --   --   --  38.3*  --   --   PLT  --   --  148*  --   --  105*  --   --   --  58*  --   --   APTT  --   < > 145* 56*  --  163*  < >  --  75*  --  89* 101*  LABPROT  --   --   --  17.8*  --  18.1*  --   --   --   --   --   --   INR  --   --   --  1.51*  --  1.55*  --   --   --   --   --   --   HEPARINUNFRC 0.47  --  0.38  --   --  0.32  --   --   --   --   --   --   CREATININE  --   --  1.87*  --   < > 1.67*  --  1.61*  --  1.58*  --   --   < > = values in this interval not displayed.  Estimated Creatinine Clearance: 59.6 ml/min (by C-G formula based on Cr of 1.58).  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  Patient with liver and renal dysfunction.  APTT has been trending up and is now supratherapeutic. Will adjust rate.    Goal of Therapy:  aPTT 50-90 seconds Monitor platelets by anticoagulation protocol: Yes   Plan:  1. Decrease argatroban to 0.18 mcg/kg/min.  2. PTT in 2 hrs

## 2012-03-15 NOTE — Procedures (Signed)
Extubation Procedure Note  Patient Details:   Name: Connor Small DOB: 1961/01/07 MRN: [id number]   Airway Documentation:     Evaluation  O2 sats: stable throughout Complications: No apparent complications Patient did tolerate procedure well. Bilateral Breath Sounds: Rhonchi;Diminished Suctioning: Airway Yes  Pt extubated per MD order. Pt placed on 4L [place] with sats of 100%.  RN at bedside.  Will continue to monitor.  Closson, Deetta Perla 03/15/2012, 10:02 AM

## 2012-03-15 NOTE — Progress Notes (Signed)
OT Cancellation Note  Patient Details Name: Connor Small MRN: [id number] DOB: [birth year]   Cancelled Treatment:     Attempted second time to see pt, but on OT arrival pt finishing with PT and receiving +2 total assist for peri care in bed.  Will f/u next date as appropriate.  03/15/2012 Darrol Jump OTR/L Pager [phone] Office [phone]

## 2012-03-15 NOTE — Progress Notes (Signed)
Patient ID: Connor Small, male   DOB: [birth year], 52 y.o.   MRN: [id number] Notified by RN of change in color of abdominal drains from serous to bloody/serosanguinous.  Patient is improving from a HD standpoint with decreasing pressor requirements.  He is extubated and denies abdominal pain.  Abdomen is soft, NT, ostomy with dark green liquid output, drains one bloody serosanguinous, other serous with small blood tinge.  This is likely due to systemic anticoagulation - on Argatroban.  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 Hb is down from this AM.  Will check F/U coags as ordered and trend CBC tonight.  Type and cross.  I updated his daughter at the bedside. Georganna Skeans, MD, MPH, FACS Pager: [phone]

## 2012-03-15 NOTE — Progress Notes (Signed)
45 Spoke with Dr. Titus Mould via telephone regarding patients elevated PT/PTT. Will hold argatroban for now and Dr. Candace Gallus will re-evaluate in the morning. Will continue to monitor patient

## 2012-03-15 NOTE — Consult Note (Signed)
[facility]    Date of Admission:  02/26/2012    Day 18 IV metronidazole        Day 18 oral vancomycin        Day 6 IV vancomycin        Day 4 imipenem        Day 3 micafungin       Reason for Consult: Persistent leukocytosis and hypotension    Referring Physician: Dr. Nadara Mode  Principal Problem:   Leukocytosis, unspecified Active Problems:   C. difficile colitis   Septic shock   Hyperbilirubinemia   UC (ulcerative colitis)   Colonic stricture   S/P cecostomy   ARF (acute renal failure)   Sinus tachycardia   Hematuria   Acute respiratory failure   Pleural effusion   Thrombocytopenia, unspecified   Atrial fibrillation   Anemia   . albumin human  25 g Intravenous Q12H  . antiseptic oral rinse  15 mL Mouth Rinse QID  . chlorhexidine  15 mL Mouth Rinse BID  . famotidine  20 mg Per Tube BID  . feeding supplement  60 mL Per Tube QID  . feeding supplement (VITAL AF 1.2 CAL)  1,000 mL Per Tube Q24H  . hydrocortisone sodium succinate  50 mg Intravenous Q6H  . imipenem-cilastatin  250 mg Intravenous Q6H  . insulin aspart  0-20 Units Subcutaneous Q4H  . insulin glargine  40 Units Subcutaneous Daily  . lactulose  30 g Per Tube BID  . metronidazole  500 mg Intravenous Q8H  . micafungin (MYCAMINE) IV  100 mg Intravenous Daily  . multivitamin  5 mL Per Tube Daily  . sodium chloride  10-40 mL Intracatheter Q12H  . vancomycin  500 mg Oral Q6H  . vancomycin  1,000 mg Intravenous Q24H    Recommendations: 1. Continue current antimicrobial agents for now   Assessment: He continues to have leukocytosis but it is trending down and his overall trajectory he is slow improvement. I do not see any definite healthcare associated infection to treat differently. He has improved since broad empiric antimicrobial therapy was instituted 6 days ago. I would continue that for now. I will consider dropping the IV metronidazole as a first at this if he  continues to improve. Theoretically, he could have some ongoing toxin production from C. difficile in his rectal stump but I think that is relatively unlikely. I agree with avoiding rectal enemas for fear of causing blowout of the rectal stump. Although his bilirubin is up he has little else to suggest acute biliary disease.    HPI: Connor Small is a 52 y.o. male with history of ulcerative colitis he received a short course of oral clindamycin after having a root canal in January. He begin to develop fever and acute on chronic diarrhea and was admitted to an [facility] on February 2 with hypotension. His C. difficile PCR was positive. 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 Colonoscopic decompression was attempted but failed because of the stricture. Pseudomembranes were noted in the distal rectum. 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 He developed septic shock and atrial fibrillation and was transferred here on February 6. All cultures were negative. His course has been complicated by dependent respiratory failure, acute renal failure, anemia, thrombocytopenia, persistent  leukocytosis, encephalopathy, and hyperbilirubinemia. He developed increased leukocytosis and recurrent hypotension earlier this week and had broad empiric antibiotic therapy added to his C. difficile therapy. Blood and sputum cultures were negative. He recently had pleural fluid tapped. Gram stain and cultures of the pleural fluid were negative as well. Repeat abdominal CT scan on February 16 did not reveal any acute abnormalities. Ultrasound of his gallbladder has been unremarkable as well. He has shown some improvement over the past 48 hours. He weaned and was extubated this morning. He is more alert and following commands. His white blood cell count is  trending down. He has no abdominal pain.   Review of Systems: Pertinent items are noted in HPI.  Past Medical History  Diagnosis Date  . Chronic diarrhea   . Rectal bleed   . Hemorrhoids   . Ulcerative colitis     Distal UC over 8 yrs ago diagnosed  . Diverticulitis of large intestine with perforation 10/2011    done at [facility]    History  Substance Use Topics  . Smoking status: Former Smoker    Types: Cigarettes    Quit date: 07/17/1984  . Smokeless tobacco: Never Used     Comment: Quit 23 yrs ago  . Alcohol Use: No    Family History  Problem Relation Age of Onset  . Cancer Sister   . Healthy Daughter   . Hypertension Mother    Allergies  Allergen Reactions  . Penicillins Other (See Comments)    Heart rate changes    OBJECTIVE: Blood pressure 88/48, pulse 92, temperature 97 F (36.1 C), temperature source Oral, resp. rate 25, height 5' 9"  (1.753 m), weight 88.7 kg (195 lb 8.8 oz), SpO2 100.00%. General: Alert, sitting up in bed. He is able to follow simple commands and not appropriately. Skin: He has some skin tears on his upper arms and generalized edema. He has left and right IJ lines that are new over the past several days. He has no acute rash. Lungs: Clear Cor: Regular S1 and S2 no murmurs Abdomen: Soft and nontender. Lower midline incision is open and packed with gauze. It is a little bit of serosanguineous drainage on the gauze. There is no odor or surrounding cellulitis. He has 2 drains with serous output. He has a right lower o'clock and ileostomy. He moves all extremities  Microbiology: Recent Results (from the past 240 hour(s))  CULTURE, BLOOD (ROUTINE X 2)     Status: None   Collection Time    03/09/12 12:25 PM      Result Value Range Status   Specimen Description BLOOD LEFT HAND   Final   Special Requests BOTTLES DRAWN AEROBIC ONLY 2CC   Final   Culture  Setup Time 03/09/2012 18:19   Final   Culture NO GROWTH 5 DAYS   Final   Report Status  03/15/2012 FINAL   Final  CULTURE, BLOOD (ROUTINE X 2)     Status: None   Collection Time    03/10/12  5:10 PM      Result Value Range Status   Specimen Description BLOOD RIGHT ANTECUBITAL   Final   Special Requests BOTTLES DRAWN AEROBIC ONLY 5CC   Final   Culture  Setup Time 03/10/2012 21:33   Final   Culture     Final   Value:        BLOOD CULTURE RECEIVED NO GROWTH TO DATE CULTURE WILL BE HELD FOR 5 DAYS BEFORE ISSUING A FINAL NEGATIVE REPORT  Report Status PENDING   Incomplete  CULTURE, RESPIRATORY (NON-EXPECTORATED)     Status: None   Collection Time    03/10/12  5:16 PM      Result Value Range Status   Specimen Description TRACHEAL ASPIRATE   Final   Special Requests Normal   Final   Gram Stain     Final   Value: ABUNDANT WBC PRESENT, PREDOMINANTLY PMN     RARE GRAM POSITIVE COCCI IN PAIRS   Culture Non-Pathogenic Oropharyngeal-type Flora Isolated.   Final   Report Status 03/13/2012 FINAL   Final    Michel Bickers, MD [facility] [phone] pager   [phone] cell 03/15/2012, 11:12 AM

## 2012-03-15 NOTE — Progress Notes (Signed)
16 Days Post-Op  Subjective: Remains intubated Blood pressure improving.  weening pressors Tolerating tube feeds  Objective: Vital signs in last 24 hours: Temp:  [96.8 F (36 C)-97.8 F (36.6 C)] 97 F (36.1 C) (02/20 0345) Pulse Rate:  [71-105] 71 (02/20 0700) Resp:  [9-22] 13 (02/20 0700) BP: (61-134)/(38-80) 132/60 mmHg (02/20 0700) SpO2:  [99 %-100 %] 100 % (02/20 0700) Arterial Line BP: (72-138)/(40-66) 137/63 mmHg (02/20 0700) FiO2 (%):  [30 %-40 %] 40 % (02/20 0700) Weight:  [195 lb 8.8 oz (88.7 kg)] 195 lb 8.8 oz (88.7 kg) (02/20 0500) Last BM Date: 03/14/12  Intake/Output from previous day: 02/19 0701 - 02/20 0700 In: 3125.9 [I.V.:1470.9; NG/GT:455; IV Piggyback:1200] Out: 3843 [Urine:187; Drains:280; Stool:275] Intake/Output this shift:    Abdomen soft, non distended, ostomy viable and productive Midline wound clean Drains serosang There is tenderness with some guarding in the RUQ.  The rest of the abdomen is non tender  Lab Results:   Recent Labs  03/14/12 0405 03/15/12 0450  WBC 29.0* 27.1*  HGB 16.0 12.4*  HCT 49.0 38.3*  PLT 105* 58*   BMET  Recent Labs  03/14/12 1600 03/15/12 0450  NA 132* 135  K 4.6 4.7  CL 98 99  CO2 23 24  GLUCOSE 146* 143*  BUN 78* 78*  CREATININE 1.61* 1.58*  CALCIUM 7.5* 7.8*   PT/INR  Recent Labs  03/13/12 0700 03/14/12 0405  LABPROT 17.8* 18.1*  INR 1.51* 1.55*   ABG  Recent Labs  03/14/12 1123 03/15/12 0458  PHART 7.416 7.440  HCO3 22.5 25.2*    Studies/Results: US Abdomen Complete  03/14/2012  *RADIOLOGY REPORT*  Clinical Data:  Elevated liver function tests.  Patient on ventilator.  COMPLETE ABDOMINAL ULTRASOUND  Comparison:  03/12/2012  Findings:  Gallbladder:  No gallstones, pericholecystic fluid.  The technologist measured an area of apparent wall thickening at up to 7 mm.  This is felt to be an overestimation, as the remainder of the wall is normal in thickness, at up to 3 mm. Sonographic  Murphy's sign was not elicited.  Common bile duct: Normal, 5 mm.  Liver: Normal in echogenicity, without focal lesion.  IVC: Negative  Pancreas:  Obscured by bowel gas.  Spleen:  Normal in size and echogenicity.  Perisplenic fluid collection is again identified.  Similar to minimally enlarged since the prior exam.  Right Kidney:  11.7 cm. No hydronephrosis.  Left Kidney:  13.8 cm. Poorly visualized due to overlying bowel gas.  Abdominal aorta:  Partially obscured by bowel gas.  No free abdominal ascites identified.  Incidental note is made of right-sided pleural effusion.  IMPRESSION:  1.  Multifactorial degradation.  Overlying bowel gas and patient clinical status. 2.  No explanation for elevated liver function tests. 3.  Similar to slight increase in size of a perisplenic fluid collection. 4.  Right pleural effusion.   Original Report Authenticated By: Abigail Miyamoto, M.D.    Dg Chest Port 1 View  03/14/2012  *RADIOLOGY REPORT*  Clinical Data: Assess effusions.  PORTABLE CHEST - 1 VIEW  Comparison: 03/13/2012  Findings: Effusions have improved.  Minimal bibasilar atelectasis and small residual right effusion.  Support devices are unchanged. Heart is normal size.  IMPRESSION: Improving effusions.  Residual small right effusion and bibasilar atelectasis.   Original Report Authenticated By: Rolm Baptise, M.D.    Dg Chest Port 1 View  03/13/2012  *RADIOLOGY REPORT*  Clinical Data: New central venous catheter placement  PORTABLE CHEST - 1  VIEW  Comparison: 03/13/2012 at 0429 hours  Findings: Endotracheal tube terminates 6 cm above the carina.  Left IJ venous catheter terminates in the lower SVC.  Right IJ venous sheath terminates in the mid SVC.  Enteric tube courses below the diaphragm.  No pneumothorax.  Suspected small right pleural effusion.  The heart is normal in size.  IMPRESSION: Endotracheal tube terminates 6 cm above the carina.  Additional support apparatus as above.  No pneumothorax is seen.   Original  Report Authenticated By: Julian Hy, M.D.     Anti-infectives: Anti-infectives   Start     Dose/Rate Route Frequency Ordered Stop   03/14/12 1000  micafungin (MYCAMINE) 100 mg in sodium chloride 0.9 % 100 mL IVPB     100 mg 100 mL/hr over 1 Hours Intravenous Daily 03/13/12 0632     03/13/12 1000  micafungin (MYCAMINE) 100 mg in sodium chloride 0.9 % 100 mL IVPB     100 mg 100 mL/hr over 1 Hours Intravenous Daily 03/13/12 0632 03/13/12 1311   03/12/12 1800  vancomycin (VANCOCIN) IVPB 1000 mg/200 mL premix     1,000 mg 200 mL/hr over 60 Minutes Intravenous Every 24 hours 03/12/12 0859     03/12/12 1000  imipenem-cilastatin (PRIMAXIN) 500 mg in sodium chloride 0.9 % 100 mL IVPB  Status:  Discontinued     500 mg 200 mL/hr over 30 Minutes Intravenous 3 times per day 03/12/12 0848 03/12/12 0909   03/12/12 1000  imipenem-cilastatin (PRIMAXIN) 250 mg in sodium chloride 0.9 % 100 mL IVPB     250 mg 200 mL/hr over 30 Minutes Intravenous 4 times per day 03/12/12 0909     03/11/12 1200  vancomycin (VANCOCIN) 750 mg in sodium chloride 0.9 % 150 mL IVPB  Status:  Discontinued     750 mg 150 mL/hr over 60 Minutes Intravenous Every 12 hours 03/11/12 0204 03/12/12 0858   03/11/12 0215  vancomycin (VANCOCIN) 750 mg in sodium chloride 0.9 % 150 mL IVPB     750 mg 150 mL/hr over 60 Minutes Intravenous  Once 03/11/12 0204 03/11/12 0352   03/11/12 0215  aztreonam (AZACTAM) 1 g in dextrose 5 % 50 mL IVPB  Status:  Discontinued     1 g 100 mL/hr over 30 Minutes Intravenous 3 times per day 03/11/12 0204 03/12/12 0851   03/05/12 2200  metroNIDAZOLE (FLAGYL) IVPB 500 mg    Comments:  First dose ASAP   500 mg 100 mL/hr over 60 Minutes Intravenous Every 8 hours 03/05/12 1346     03/02/12 1200  vancomycin (VANCOCIN) 50 mg/mL oral solution 500 mg     500 mg Oral 4 times per day 03/02/12 0913     03/01/12 1445  vancomycin (VANCOCIN) 50 mg/mL oral solution 500 mg  Status:  Discontinued     500 mg Oral 3  times per day 03/01/12 1340 03/02/12 0913   03/01/12 1400  aztreonam (AZACTAM) 1 g in dextrose 5 % 50 mL IVPB  Status:  Discontinued     1 g 100 mL/hr over 30 Minutes Intravenous 3 times per day 03/01/12 1356 03/02/12 1146   03/01/12 1300  vancomycin (VANCOCIN) 1,250 mg in sodium chloride 0.9 % 250 mL IVPB  Status:  Discontinued     1,250 mg 166.7 mL/hr over 90 Minutes Intravenous Every 12 hours 03/01/12 1127 03/02/12 1146   02/29/12 0600  ciprofloxacin (CIPRO) IVPB 400 mg     400 mg 200 mL/hr over 60 Minutes Intravenous On  call to O.R. 02/28/12 1409 02/28/12 1438   02/28/12 2200  vancomycin (VANCOCIN) 500 mg in sodium chloride irrigation 0.9 % 60 mL ENEMA  Status:  Discontinued     500 mg Rectal 2 times daily 02/28/12 1504 02/28/12 1817   02/28/12 1200  vancomycin (VANCOCIN) 50 mg/mL oral solution 500 mg  Status:  Discontinued    Comments:  First dose ASAP   500 mg Per Tube 4 times per day 02/28/12 0814 02/28/12 1817   02/28/12 1000  vancomycin (VANCOCIN) IVPB 1000 mg/200 mL premix  Status:  Discontinued     1,000 mg 200 mL/hr over 60 Minutes Intravenous Every 12 hours 02/28/12 0845 03/01/12 1127   02/28/12 1000  vancomycin (VANCOCIN) 500 mg in sodium chloride irrigation 0.9 % 100 mL ENEMA  Status:  Discontinued     500 mg Rectal 2 times daily 02/28/12 0923 02/28/12 1504   02/27/12 1600  metroNIDAZOLE (FLAGYL) IVPB 500 mg  Status:  Discontinued     500 mg 100 mL/hr over 60 Minutes Intravenous Every 8 hours 02/27/12 1546 02/27/12 2000   02/27/12 1600  ciprofloxacin (CIPRO) IVPB 400 mg  Status:  Discontinued     400 mg 200 mL/hr over 60 Minutes Intravenous Every 12 hours 02/27/12 1547 02/27/12 2000   02/27/12 1545  metroNIDAZOLE (FLAGYL) IVPB 500 mg  Status:  Discontinued     500 mg 100 mL/hr over 60 Minutes Intravenous  Once 02/27/12 1535 02/28/12 0843   02/27/12 1545  vancomycin (VANCOCIN) 500 mg in sodium chloride 0.9 % 100 mL IVPB  Status:  Discontinued     500 mg 100 mL/hr over 60  Minutes Intravenous  Once 02/27/12 1535 02/27/12 1538   02/27/12 1545  ciprofloxacin (CIPRO) IVPB 400 mg  Status:  Discontinued     400 mg 200 mL/hr over 60 Minutes Intravenous  Once 02/27/12 1538 02/28/12 0843   02/27/12 1415  vancomycin (VANCOCIN) 500 mg in sodium chloride 0.9 % 100 mL IVPB     500 mg 100 mL/hr over 60 Minutes Intravenous  Once 02/27/12 1402 02/27/12 1411   02/27/12 1400  vancomycin (VANCOCIN) powder 500 mg  Status:  Discontinued     500 mg Other To Surgery 02/27/12 1359 02/27/12 1402   02/27/12 1200  metroNIDAZOLE (FLAGYL) IVPB 500 mg  Status:  Discontinued    Comments:  First dose ASAP   500 mg 100 mL/hr over 60 Minutes Intravenous Every 6 hours 02/27/12 0953 03/05/12 1346   02/27/12 1200  vancomycin (VANCOCIN) 50 mg/mL oral solution 250 mg  Status:  Discontinued    Comments:  First dose ASAP   250 mg Oral 4 times per day 02/27/12 0953 02/28/12 0814   02/26/12 1700  oseltamivir (TAMIFLU) capsule 75 mg     75 mg Oral  Once 02/26/12 1651 02/26/12 1715      Assessment/Plan: s/p Procedure(s): TOTAL COLECTOMY (N/A) ILEOSTOMY (N/A)  Ultrasound unremarkable.  Would like a HIDA if he does not improve to r/o cholecystitis, but may not be possible.  Would still have to consider having IR place perc drain, but will hold for now given clinical improvement.  Again, I doubt he has ischemic bowel.  LOS: 18 days    Jeanee Fabre A 03/15/2012

## 2012-03-15 NOTE — Progress Notes (Signed)
VASCULAR LAB PRELIMINARY  PRELIMINARY  PRELIMINARY  PRELIMINARY  Repeat Bilateral lower extremity venous duplex completed.    Preliminary report:  Bilateral:  No evidence of DVT, superficial thrombosis, or Baker's Cyst. Technically difficult in the groin bilaterally due to lines. No change since study of 03/09/2012   Toma Copier, RVS 03/15/2012, 1:28 PM

## 2012-03-15 NOTE — Progress Notes (Signed)
Inpatient Diabetes Program Recommendations  AACE/ADA: New Consensus Statement on Inpatient Glycemic Control (2013)  Target Ranges:  Prepandial:   less than 140 mg/dL      Peak postprandial:   less than 180 mg/dL (1-2 hours)      Critically ill patients:  140 - 180 mg/dL   Results for Connor Small, Connor Small (MRN [id number]) as of 03/15/2012 09:17  Ref. Range 03/14/2012 00:34 03/14/2012 04:42 03/14/2012 07:48 03/14/2012 11:42 03/14/2012 15:44 03/14/2012 20:16 03/15/2012 00:46 03/15/2012 03:44 03/15/2012 08:55  Glucose-Capillary Latest Range: 70-99 mg/dL 221 (H) 204 (H) 196 (H) 174 (H) 140 (H) 149 (H) 127 (H) 122 (H) 152 (H)    Inpatient Diabetes Program Recommendations HgbA1C: Please consider ordering an A1C to determine glycemic control over the past 2-3 months.  Note: Patient does not have a documented history of diabetes.  Initial lab glucose was 114 mg/dl on 02/26/12.  Patient is currently receiving Lantus 40 units daily and Novolog resistant correction Q4H to maintain glycemic control.  There are not any previous A1C values noted in the patient's chart.  Please consider ordering an A1C to determine glycemic control over the past 2-3 months.  Will continue to follow.  Thanks, Barnie Alderman, RN, BSN, [facility] Coordinator Inpatient Diabetes Program [phone]

## 2012-03-15 NOTE — Progress Notes (Signed)
Physical Therapy Treatment Patient Details Name: Connor Small MRN: [id number] DOB: [birth year] Today's Date: 03/15/2012 Time: 6213-0865 PT Time Calculation (min): 45 min  PT Assessment / Plan / Recommendation Comments on Treatment Session  Noted some beginning of truncal activation is sitting EOB during a 30 minute period.  Weak cervical and spinal extension; Beginning to display more active movement and gross ext.=--L greater tnat right.  L UE flexion and extension better than R UE.    Follow Up Recommendations  SNF;Supervision/Assistance - 24 hour     Does the patient have the potential to tolerate intense rehabilitation     Barriers to Discharge        Equipment Recommendations  Other (comment) (TBA)    Recommendations for Other Services    Frequency Min 2X/week   Plan Discharge plan remains appropriate;Frequency needs to be updated    Precautions / Restrictions Precautions Precautions: Fall Restrictions Weight Bearing Restrictions: No   Pertinent Vitals/Pain Vitals stable throughout    Mobility  Bed Mobility Bed Mobility: Rolling Right;Rolling Left;Left Sidelying to Sit;Sitting - Scoot to [employer] of Bed;Sit to Supine Rolling Right: 1: +2 Total assist Rolling Right: Patient Percentage: 10% Rolling Left: 1: +2 Total assist Rolling Left: Patient Percentage: 10% Left Sidelying to Sit: 1: +1 Total assist (+2 not available) Sitting - Scoot to Edge of Bed: 1: +1 Total assist Sit to Supine: 1: +2 Total assist;HOB flat Sit to Supine: Patient Percentage: 0% Details for Bed Mobility Assistance: vc/tc's through normal movement;significant truncal assist Transfers Transfers: Not assessed Ambulation/Gait Ambulation/Gait Assistance: Not tested (comment) Stairs: No Wheelchair Mobility Wheelchair Mobility: No    Exercises General Exercises - Lower Extremity Heel Slides: AAROM;10 reps;Other (comment);Supine (with resisted extension) Other Exercises Other Exercises: PROM to bil  upper and lower extremities   PT Diagnosis:    PT Problem List:   PT Treatment Interventions:     PT Goals Acute Rehab PT Goals Time For Goal Achievement: 03/26/12 Potential to Achieve Goals: Fair Pt will go Supine/Side to Sit: with mod assist;with rail PT Goal: Supine/Side to Sit - Progress: Not met Pt will Sit at [facility] of Bed: with mod assist;1-2 min;with bilateral upper extremity support PT Goal: Sit at Edge Of Bed - Progress: Progressing toward goal PT Goal: Perform Home Exercise Program - Progress: Progressing toward goal  Visit Information  Last PT Received On: 03/15/12 Assistance Needed: +2    Subjective Data  Subjective: not verbal eventhough extubated   Cognition  Cognition Overall Cognitive Status: Impaired Arousal/Alertness: Awake/alert (but some lethargy) Orientation Level: Other (comment) (difficult to assess) Behavior During Session: Lethargic    Balance  Balance Balance Assessed: Yes Static Sitting Balance Static Sitting - Balance Support: Feet supported;Right upper extremity supported;Left upper extremity supported Static Sitting - Level of Assistance: 1: +1 Total assist Static Sitting - Comment/# of Minutes: 30 or greater min.  Working on truncal activation, sitting tolerance cervical and spinal extension, utilized side sitting propped on each elbow.  End of Session PT - End of Session Activity Tolerance: Patient limited by fatigue;Other (comment) Patient left: in bed;with bed alarm set;with nursing in room Nurse Communication: Mobility status;Need for lift equipment   GP     Herberta Pickron, Tessie Fass 03/15/2012, 4:00 PM

## 2012-03-15 NOTE — Progress Notes (Signed)
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 Penicillins Other (See Comments)    Heart rate changes    Patient Measurements: Height: 5' 9"  (175.3 cm) Weight: 203 lb 11.3 oz (92.4 kg) IBW/kg (Calculated) : 70.7  Vital Signs: Temp: 97 F (36.1 C) (02/20 0345) Temp src: Oral (02/20 0345) BP: 124/67 mmHg (02/20 0500) Pulse Rate: 86 (02/20 0500)  Labs:  Recent Labs  03/12/12 0850  03/12/12 2130  03/13/12 0445 03/13/12 0700 03/13/12 1630 03/14/12 0405 03/14/12 1230 03/14/12 1600 03/14/12 1630 03/15/12 0450 03/15/12 0501  HGB  --   --   --   < > 16.6  --   --  16.0  --   --   --  12.4*  --   HCT  --   --   --   --  49.2  --   --  49.0  --   --   --  38.3*  --   PLT  --   --   --   --  148*  --   --  105*  --   --   --  PENDING  --   APTT 68*  --   --   --  145* 56*  --  163* 70*  --  75*  --  89*  LABPROT 19.6*  --   --   --   --  17.8*  --  18.1*  --   --   --   --   --   INR 1.72*  --   --   --   --  1.51*  --  1.55*  --   --   --   --   --   HEPARINUNFRC  --   --  0.47  --  0.38  --   --  0.32  --   --   --   --   --   CREATININE  --   < >  --   --  1.87*  --  1.83* 1.67*  --  1.61*  --   --   --   < > = values in this interval not displayed.  Estimated Creatinine Clearance: 59.6 ml/min (by C-G formula based on Cr of 1.61).  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  Patient with liver and renal dysfunction.  APTT (89) is at-goal on argatroban infusion at 0.25 mcg/kg/min. Noted Hgb 16 --> 12.4, but no bleeding per RN.   Goal of Therapy:  aPTT 50-90 seconds Monitor platelets by anticoagulation protocol: Yes   Plan:  1. Continue argatroban at 0.25 mcg/kg/min.  2. APTT Q12H (next at 17:00).  Raye Sorrow, PharmD 03/15/12, 05:30 AM

## 2012-03-15 NOTE — Progress Notes (Signed)
OT Cancellation Note  Treatment cancelled today due to pt extubated this morning per RT note filed less than an hour ago.  Will check on pt this afternoon if appropriate for OT session.  03/15/2012 Darrol Jump OTR/L Pager [phone] Office [phone]

## 2012-03-15 NOTE — Progress Notes (Signed)
Patient ID: Connor Small, male   DOB: December 16, 1960, 52 y.o.   MRN: [id number] S:intubated O:BP 132/60  Pulse 71  Temp(Src) 97 F (36.1 C) (Oral)  Resp 13  Ht 5\' 9"  (1.753 m)  Wt 88.7 kg (195 lb 8.8 oz)  BMI 28.86 kg/m2  SpO2 100%  Intake/Output Summary (Last 24 hours) at 03/15/12 0813 Last data filed at 03/15/12 0700  Gross per 24 hour  Intake 3021.09 ml  Output   3619 ml  Net -597.91 ml   Intake/Output: I/O last 3 completed shifts: In: 4811.4 [I.V.:2286.4; NG/GT:925; IV Piggyback:1600] Out: 2595 [Urine:332; Drains:430; GLOVF:6433; Stool:725]  Intake/Output this shift:    Weight change: -3.7 kg (-8 lb 2.5 oz) IRJ:JOACZYSAY, critically ill CVS:no rub Resp:decreased BS Abd:no change  Ext:+anasarca, cyanosis   Recent Labs Lab 03/10/12 0534  03/11/12 0620 03/12/12 0455 03/12/12 1600 03/13/12 0445 03/13/12 1630 03/14/12 0405 03/14/12 1600 03/15/12 0450  NA 134*  < > 133* 131* 134* 132* 132* 130* 132* 135  K 4.8  < > 5.0 5.8* 5.1 4.6 4.6 4.5 4.6 4.7  CL 102  < > 102 99 98 91* 95* 97 98 99  CO2 20  < > 19 17* 22 29 24 22 23 24   GLUCOSE 184*  < > 243* 143* 139* 236* 235* 224* 146* 143*  BUN 104*  < > 130* 158* 148* 113* 100* 91* 78* 78*  CREATININE 2.04*  < > 2.35* 2.74* 2.54* 1.87* 1.83* 1.67* 1.61* 1.58*  ALBUMIN 1.1*  --  1.3* 1.4* 1.5* 1.6* 1.5* 1.6* 2.4* 2.5*  CALCIUM 6.8*  < > 7.2* 7.0* 6.8* 7.0* 6.9* 7.0* 7.5* 7.8*  PHOS  --   < > 5.6*  --  7.4* 6.3* 6.2* 6.0* 5.7* 5.1*  AST 117*  --  129* 117*  --  82*  --  75*  --  62*  ALT 41  --  55* 61*  --  62*  --  59*  --  43  < > = values in this interval not displayed. Liver Function Tests:  Recent Labs Lab 03/13/12 0445 03/13/12 0700  03/14/12 0405 03/14/12 1600 03/15/12 0450  AST 82*  --   --  75*  --  62*  ALT 62*  --   --  59*  --  43  ALKPHOS 680*  --   --  603*  --  386*  BILITOT 13.2* 13.9*  --  13.9*  --  15.5*  PROT 4.6*  --   --  4.6*  --  4.6*  ALBUMIN 1.6*  --   < > 1.6* 2.4* 2.5*  < > = values  in this interval not displayed. No results found for this basename: LIPASE, AMYLASE,  in the last 168 hours No results found for this basename: AMMONIA,  in the last 168 hours CBC:  Recent Labs Lab 03/11/12 0620 03/12/12 0455 03/13/12 0445 03/14/12 0405 03/15/12 0450  WBC 41.2* 39.4* 28.7* 29.0* 27.1*  NEUTROABS  --   --  23.6* 26.1* 23.3*  HGB 15.3 16.4 16.6 16.0 12.4*  HCT 45.3 49.9 49.2 49.0 38.3*  MCV 96.6 97.7 96.1 97.0 97.5  PLT 289 232 148* 105* 58*   Cardiac Enzymes: No results found for this basename: CKTOTAL, CKMB, CKMBINDEX, TROPONINI,  in the last 168 hours CBG:  Recent Labs Lab 03/14/12 1142 03/14/12 1544 03/14/12 2016 03/15/12 0046 03/15/12 0344  GLUCAP 174* 140* 149* 127* 122*    Iron Studies: No results found for  this basename: IRON, TIBC, TRANSFERRIN, FERRITIN,  in the last 72 hours Studies/Results: US Abdomen Complete  03/14/2012  *RADIOLOGY REPORT*  Clinical Data:  Elevated liver function tests.  Patient on ventilator.  COMPLETE ABDOMINAL ULTRASOUND  Comparison:  03/12/2012  Findings:  Gallbladder:  No gallstones, pericholecystic fluid.  The technologist measured an area of apparent wall thickening at up to 7 mm.  This is felt to be an overestimation, as the remainder of the wall is normal in thickness, at up to 3 mm. Sonographic Murphy's sign was not elicited.  Common bile duct: Normal, 5 mm.  Liver: Normal in echogenicity, without focal lesion.  IVC: Negative  Pancreas:  Obscured by bowel gas.  Spleen:  Normal in size and echogenicity.  Perisplenic fluid collection is again identified.  Similar to minimally enlarged since the prior exam.  Right Kidney:  11.7 cm. No hydronephrosis.  Left Kidney:  13.8 cm. Poorly visualized due to overlying bowel gas.  Abdominal aorta:  Partially obscured by bowel gas.  No free abdominal ascites identified.  Incidental note is made of right-sided pleural effusion.  IMPRESSION:  1.  Multifactorial degradation.  Overlying bowel  gas and patient clinical status. 2.  No explanation for elevated liver function tests. 3.  Similar to slight increase in size of a perisplenic fluid collection. 4.  Right pleural effusion.   Original Report Authenticated By: Jeronimo Greaves, M.D.    Dg Chest Port 1 View  03/15/2012  *RADIOLOGY REPORT*  Clinical Data: Evaluate pulmonary edema.  PORTABLE CHEST - 1 VIEW  Comparison: Chest x-ray 03/14/2012.  Findings: An endotracheal tube is in place with tip 4.1 cm above the carina. There is a left-sided internal jugular central venous catheter with tip terminating in the distal superior vena cava. There is a right-sided internal jugular central venous catheter with tip terminating in the mid superior vena cava. A nasogastric tube is seen extending into the stomach, however, the tip of the nasogastric tube extends below the lower margin of the image.  Lung volumes are low.  Ill-defined veil like opacity in the lower right hemithorax is most compatible with a small right pleural effusion. There is likely some associated subsegmental atelectasis in the right lower lobe.  Left lung is clear.  Pulmonary vasculature is normal.  Heart size is normal.  Mediastinal contours are unremarkable.  IMPRESSION: 1.  Support apparatus, as above. 2.  Small right pleural effusion with right lower lobe subsegmental atelectasis.   Original Report Authenticated By: Trudie Reed, M.D.    Dg Chest Port 1 View  03/14/2012  *RADIOLOGY REPORT*  Clinical Data: Assess effusions.  PORTABLE CHEST - 1 VIEW  Comparison: 03/13/2012  Findings: Effusions have improved.  Minimal bibasilar atelectasis and small residual right effusion.  Support devices are unchanged. Heart is normal size.  IMPRESSION: Improving effusions.  Residual small right effusion and bibasilar atelectasis.   Original Report Authenticated By: Charlett Nose, M.D.    Dg Chest Port 1 View  03/13/2012  *RADIOLOGY REPORT*  Clinical Data: New central venous catheter placement   PORTABLE CHEST - 1 VIEW  Comparison: 03/13/2012 at 0429 hours  Findings: Endotracheal tube terminates 6 cm above the carina.  Left IJ venous catheter terminates in the lower SVC.  Right IJ venous sheath terminates in the mid SVC.  Enteric tube courses below the diaphragm.  No pneumothorax.  Suspected small right pleural effusion.  The heart is normal in size.  IMPRESSION: Endotracheal tube terminates 6 cm above the carina.  Additional  support apparatus as above.  No pneumothorax is seen.   Original Report Authenticated By: Charline Bills, M.D.    . albumin human  25 g Intravenous Q12H  . antiseptic oral rinse  15 mL Mouth Rinse QID  . chlorhexidine  15 mL Mouth Rinse BID  . famotidine  20 mg Per Tube BID  . feeding supplement  60 mL Per Tube QID  . feeding supplement (VITAL AF 1.2 CAL)  1,000 mL Per Tube Q24H  . hydrocortisone sodium succinate  50 mg Intravenous Q6H  . imipenem-cilastatin  250 mg Intravenous Q6H  . insulin aspart  0-20 Units Subcutaneous Q4H  . insulin glargine  40 Units Subcutaneous Daily  . metronidazole  500 mg Intravenous Q8H  . micafungin (MYCAMINE) IV  100 mg Intravenous Daily  . multivitamin  5 mL Per Tube Daily  . sodium chloride  10-40 mL Intracatheter Q12H  . vancomycin  500 mg Oral Q6H  . vancomycin  1,000 mg Intravenous Q24H    BMET    Component Value Date/Time   NA 135 03/15/2012 0450   K 4.7 03/15/2012 0450   CL 99 03/15/2012 0450   CO2 24 03/15/2012 0450   GLUCOSE 143* 03/15/2012 0450   BUN 78* 03/15/2012 0450   CREATININE 1.58* 03/15/2012 0450   CALCIUM 7.8* 03/15/2012 0450   GFRNONAA 48* 03/15/2012 0450   GFRAA 56* 03/15/2012 0450   CBC    Component Value Date/Time   WBC 27.1* 03/15/2012 0450   RBC 3.93* 03/15/2012 0450   HGB 12.4* 03/15/2012 0450   HCT 38.3* 03/15/2012 0450   PLT 58* 03/15/2012 0450   MCV 97.5 03/15/2012 0450   MCH 31.6 03/15/2012 0450   MCHC 32.4 03/15/2012 0450   RDW 21.1* 03/15/2012 0450   LYMPHSABS 1.1 03/15/2012 0450   MONOABS 2.4*  03/15/2012 0450   EOSABS 0.0 03/15/2012 0450   BASOSABS 0.3* 03/15/2012 0450    Assessment/Plan:  1. ARF- oliguric. Likely ischemic ATN in light of intravascular volume depletion, hypotension, and ongoing pressors. Currently on CVVHD, however not able to tolerate UF due to hypotension. Some response to IV albumin, however very tenuous hemodynamics.  Discussed with Dr. Tyson Alias and will hld CVVHD for 24 hours and see how he responds and if we can wean vasopressin.  2. VDRF- per PCCM 3. C diff/s/p colectomy/iliostomy- per primary svc 4. SIRS- on pressors per PCCM 5. Hyperkalemia- improved with CVVHD. 6. Metabolic acidosis- as above. Will decrease bicarb gtt 7. Hyponatremia- due to anasarca/protein malnutrition. Plan as above on hydrocortisone. 8. Abnormal LFT's- ?shock liver with rising LDH worrisome for ischemic bowel or visceral necrosis. Surgery following 9. Leukocytosis- on abx. 10. Anasarca- difficult to remove fluid but losing quite a bit through drains. Agree with albumin. 11. Dispo- cont with aggressive measures for now. 12.  Lakea Mittelman A

## 2012-03-15 NOTE — Progress Notes (Signed)
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 Penicillins Other (See Comments)    Heart rate changes    Patient Measurements: Height: 5' 9"  (175.3 cm) Weight: 195 lb 8.8 oz (88.7 kg) IBW/kg (Calculated) : 70.7  Vital Signs: BP: 86/43 mmHg (02/20 1900) Pulse Rate: 100 (02/20 1945)  Labs:  Recent Labs  03/12/12 2130  03/13/12 0445 03/13/12 0700  03/14/12 0405  03/14/12 1600  03/15/12 0450 03/15/12 0501 03/15/12 1600 03/15/12 1810 03/15/12 1930  HGB  --   < > 16.6  --   --  16.0  --   --   --  12.4*  --   --  10.7*  --   HCT  --   < > 49.2  --   --  49.0  --   --   --  38.3*  --   --  32.6*  --   PLT  --   < > 148*  --   --  105*  --   --   --  58*  --   --  34*  --   APTT  --   < > 145* 56*  --  163*  < >  --   < >  --  89* 101*  --  101*  LABPROT  --   --   --  17.8*  --  18.1*  --   --   --   --   --   --   --   --   INR  --   --   --  1.51*  --  1.55*  --   --   --   --   --   --   --   --   HEPARINUNFRC 0.47  --  0.38  --   --  0.32  --   --   --   --   --   --   --   --   CREATININE  --   --  1.87*  --   < > 1.67*  --  1.61*  --  1.58*  --  1.97*  --   --   < > = values in this interval not displayed.  Estimated Creatinine Clearance: 47.8 ml/min (by C-G formula based on Cr of 1.97).  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  Patient with liver and renal dysfunction.  APTT is supratherapeutic again tonight. RN said some of the drainage fluid is pinkish but better than earlier. PTT remains the same after rate change. Will hold for brief period this time.    Goal of Therapy:  aPTT 50-90 seconds Monitor platelets by anticoagulation protocol: Yes   Plan:  1. Hold argatroban for 30 minutes 2. Decrease argatroban to 0.13 mcg/kg/min.  2. PTT in 2 hrs

## 2012-03-15 NOTE — Progress Notes (Signed)
PULMONARY  / CRITICAL CARE MEDICINE  Name: Connor Small MRN: [id number] DOB: March 31, 1960    ADMISSION DATE:  02/26/2012 CONSULTATION DATE:  2/5  REFERRING MD :  Karilyn Cota  CHIEF COMPLAINT:  Acute resp failure   BRIEF PATIENT DESCRIPTION:  48 YOM admitted to [facility] for Cdiff on 2/2. Underwent decompressive cecostomy 2/3, initially was better, then worsened on 2/4 with increased abdominal distension and tenderness, decreased renal function.  He was taken back to the OR on 2/4 for total abdominal colectomy and end ileostomy.  Post op he developed ARDS and sepsis shock and was being treated for this.  The morning of 2/6 he had new onset of A-fib and transferred to [facility].    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 Overlying bowel gas and patientclinical status. 2. No explanation for elevated liver function tests.3. Similar to slight increase in size of a perisplenic fluidcollection.4. Right pleural effusion 2/20- reduction pressors, improved alertness   LINES / TUBES: ETT - OSH 2/4>>> Foley-OSH 2/3>>> PICC OSH 2/4>>>2/11 Art Line - OSH 2/5>>>out A line rt fem 2/18>>> Rt ij HD 2/17>>> Left IJ  2/18>>>  CULTURES: BCx2 2/4>>>negative BCx2 2/3>>>negative UC 2/3- Negative MRSA PCR 2/3- Negative C diff 2/6>>>Positive Body fluid 2/9>>>WBCs, no organisms 2/15 BC x 2 > 2/15 Sputum >>>NF  ANTIBIOTICS: Flagyl 2/3>>> Vanc 2/6 (IV) >>>2/7 azactam 2/6>>>2/7 Oral vanc 2/6>>> IV vanc 2/16 > Aztreonam 2/16 >2/17 Imipenem 2/17>>> mycofungin 2/18>>>  SUBJECTIVE:  Neg balance, levo to 20  VITAL SIGNS: Temp:  [96.8 F (36 C)-97.7 F (36.5 C)] 97 F (36.1 C) (02/20 0345) Pulse Rate:  [71-105] 71 (02/20 0800) Resp:  [9-22] 14 (02/20 0800) BP: (61-134)/(38-80) 121/67 mmHg (02/20 0800) SpO2:  [99 %-100 %] 100 % (02/20 0800) Arterial Line BP: (72-138)/(40-66) 123/58 mmHg (02/20 0815) FiO2 (%):  [30 %-40 %] 40 % (02/20 0700) Weight:  [88.7 kg (195 lb 8.8 oz)] 88.7 kg (195 lb 8.8 oz) (02/20 0500) HEMODYNAMICS: CVP:  [1 mmHg-4 mmHg] 4 mmHg VENTILATOR SETTINGS: Vent Mode:  [-] PSV;CPAP FiO2 (%):  [30 %-40 %] 40 % Set Rate:  [14 bmp] 14 bmp Vt Set:  [600 mL] 600 mL PEEP:  [5 cmH20] 5 cmH20 Pressure Support:  [5 cmH20-10 cmH20] 10 cmH20 Plateau Pressure:  [14 cmH20-16 cmH20] 14 cmH20 INTAKE / OUTPUT: Intake/Output     02/19 0701 - 02/20 0700 02/20 0701 - 02/21 0700   I.V. (mL/kg) 1470.9 (16.6) 48.3 (0.5)   NG/GT 455 20   IV Piggyback 1200    Total Intake(mL/kg) 3125.9 (35.2) 68.3 (0.8)   Urine (mL/kg/hr) 187 (0.1)    Drains 280 (0.1)    Other 3101 (1.5) 55 (0.4)   Stool 275 (0.1)  Total Output 3843 55   Net -717.1 +13.3          PHYSICAL EXAMINATION: General awake, alert, nonfocal Neuro: awake, much more interactive HEENT: ETT in place Cardiovascular: irregular s1 s 2 Lungs: diminshed BS in bases improved Abdomen: no longer Distended, wound site clean, hypoBS, ostomy unchange, belly soft Musculoskeletal: 1+ edema Skin: no rashes  LABS:  Recent Labs Lab 03/11/12 0620 03/12/12 0455  03/12/12 0850  03/13/12 0445 03/13/12 0700  03/13/12 1701 03/14/12 0405  03/14/12 1123 03/14/12 1230 03/14/12 1600 03/14/12 1630 03/15/12 0450 03/15/12 0458 03/15/12 0501  HGB 15.3 16.4  --   --   --  16.6  --   --   --  16.0  --   --   --   --  12.4*  --   --   WBC 41.2* 39.4*  --   --   --  28.7*  --   --   --  29.0*  --   --   --   --  27.1*  --   --   PLT 289 232  --   --   --  148*  --   --   --  105*  --   --   --   --  58*  --   --   NA 133* 131*  --   --   < > 132*  --   < >  --  130*  --   --  132*  --  135  --   --   K 5.0 5.8*  --   --   < > 4.6  --   < >  --  4.5  --   --  4.6  --  4.7  --   --   CL 102 99  --   --   < > 91*  --   < >  --  97  --   --  98  --  99  --   --   CO2 19 17*  --   --   < > 29  --   < >  --  22  --   --  23  --  24  --   --   GLUCOSE 243* 143*  --   --   < > 236*  --   < >  --  224*  --   --  146*  --  143*  --   --   BUN 130* 158*  --   --   < > 113*  --   < >  --  91*  --   --  78*  --  78*  --   --   CREATININE 2.35* 2.74*  --   --   < > 1.87*  --   < >  --  1.67*  --   --  1.61*  --  1.58*  --   --   CALCIUM 7.2* 7.0*  --   --   < > 7.0*  --   < >  --  7.0*  --   --  7.5*  --  7.8*  --   --   MG 2.5  --   --   --   --  2.4  --   --   --  2.6*  --   --   --   --  2.8*  --   --  PHOS 5.6*  --   --   --   < > 6.3*  --   < >  --  6.0*  --   --  5.7*  --  5.1*  --   --   AST 129* 117*  --   --   --  82*  --   --   --  75*  --   --   --   --  62*  --   --   ALT 55* 61*  --   --   --  62*  --   --   --  59*  --   --   --   --  43  --   --   ALKPHOS 710* 639*  --   --   --  680*  --   --   --  603*  --   --   --   --  386*  --   --   BILITOT 3.9* 7.0*  --   --   --  13.2* 13.9*  --   --  13.9*  --   --   --   --  15.5*  --   --   PROT 4.1* 4.4*  --   --   --  4.6*  --   --   --  4.6*  --   --   --   --  4.6*  --   --   ALBUMIN 1.3* 1.4*  --   --   < > 1.6*  --   < >  --  1.6*  --   --  2.4*  --  2.5*  --   --   APTT  --   --   < > 68*  --  145* 56*  --   --  163*  --  70*  --  75*  --   --  89*  INR  --   --   --  1.72*  --    --  1.51*  --   --  1.55*  --   --   --   --   --   --   --   LATICACIDVEN  --   --   --   --   --   --  3.3*  --   --   --   --   --   --   --   --   --   --   PROCALCITON 4.22 5.70  --   --   --   --   --   --   --   --   --   --   --   --   --   --   --   PHART  --   --   --   --   --   --   --   < > 7.425  --  7.416  --   --   --   --  7.440  --   PCO2ART  --   --   --   --   --   --   --   < > 39.8  --  35.0  --   --   --   --  36.8  --   PO2ART  --   --   --   --   --   --   --   < >  153.0*  --  154.0*  --   --   --   --  194.0*  --   < > = values in this interval not displayed.  Recent Labs Lab 03/14/12 1142 03/14/12 1544 03/14/12 2016 03/15/12 0046 03/15/12 0344  GLUCAP 174* 140* 149* 127* 122*    Imaging: US Abdomen Complete  03/14/2012  *RADIOLOGY REPORT*  Clinical Data:  Elevated liver function tests.  Patient on ventilator.  COMPLETE ABDOMINAL ULTRASOUND  Comparison:  03/12/2012  Findings:  Gallbladder:  No gallstones, pericholecystic fluid.  The technologist measured an area of apparent wall thickening at up to 7 mm.  This is felt to be an overestimation, as the remainder of the wall is normal in thickness, at up to 3 mm. Sonographic Murphy's sign was not elicited.  Common bile duct: Normal, 5 mm.  Liver: Normal in echogenicity, without focal lesion.  IVC: Negative  Pancreas:  Obscured by bowel gas.  Spleen:  Normal in size and echogenicity.  Perisplenic fluid collection is again identified.  Similar to minimally enlarged since the prior exam.  Right Kidney:  11.7 cm. No hydronephrosis.  Left Kidney:  13.8 cm. Poorly visualized due to overlying bowel gas.  Abdominal aorta:  Partially obscured by bowel gas.  No free abdominal ascites identified.  Incidental note is made of right-sided pleural effusion.  IMPRESSION:  1.  Multifactorial degradation.  Overlying bowel gas and patient clinical status. 2.  No explanation for elevated liver function tests. 3.  Similar to slight increase in  size of a perisplenic fluid collection. 4.  Right pleural effusion.   Original Report Authenticated By: Jeronimo Greaves, M.D.    Dg Chest Port 1 View  03/15/2012  *RADIOLOGY REPORT*  Clinical Data: Evaluate pulmonary edema.  PORTABLE CHEST - 1 VIEW  Comparison: Chest x-ray 03/14/2012.  Findings: An endotracheal tube is in place with tip 4.1 cm above the carina. There is a left-sided internal jugular central venous catheter with tip terminating in the distal superior vena cava. There is a right-sided internal jugular central venous catheter with tip terminating in the mid superior vena cava. A nasogastric tube is seen extending into the stomach, however, the tip of the nasogastric tube extends below the lower margin of the image.  Lung volumes are low.  Ill-defined veil like opacity in the lower right hemithorax is most compatible with a small right pleural effusion. There is likely some associated subsegmental atelectasis in the right lower lobe.  Left lung is clear.  Pulmonary vasculature is normal.  Heart size is normal.  Mediastinal contours are unremarkable.  IMPRESSION: 1.  Support apparatus, as above. 2.  Small right pleural effusion with right lower lobe subsegmental atelectasis.   Original Report Authenticated By: Trudie Reed, M.D.    Dg Chest Port 1 View  03/14/2012  *RADIOLOGY REPORT*  Clinical Data: Assess effusions.  PORTABLE CHEST - 1 VIEW  Comparison: 03/13/2012  Findings: Effusions have improved.  Minimal bibasilar atelectasis and small residual right effusion.  Support devices are unchanged. Heart is normal size.  IMPRESSION: Improving effusions.  Residual small right effusion and bibasilar atelectasis.   Original Report Authenticated By: Charlett Nose, M.D.    Dg Chest Port 1 View  03/13/2012  *RADIOLOGY REPORT*  Clinical Data: New central venous catheter placement  PORTABLE CHEST - 1 VIEW  Comparison: 03/13/2012 at 0429 hours  Findings: Endotracheal tube terminates 6 cm above the carina.   Left IJ venous catheter terminates in the lower SVC.  Right  IJ venous sheath terminates in the mid SVC.  Enteric tube courses below the diaphragm.  No pneumothorax.  Suspected small right pleural effusion.  The heart is normal in size.  IMPRESSION: Endotracheal tube terminates 6 cm above the carina.  Additional support apparatus as above.  No pneumothorax is seen.   Original Report Authenticated By: Charline Bills, M.D.    CXR 2/20 >mild hazziness rt base, ett wnl ASSESSMENT / PLAN:  PULMONARY A: Acute respiratory failure in setting of septic shock and volume overload.  Lt pleural effusion.   P:   Pressors continue to improve wean cpap 5 ps 5, goal 30 min, will consider extubation with improved alertness, no plans OR and daily risk VAP abg reviewed, small reduction MV if remains on rest cotninue even  Balance to pos Consider dc cvvhd, see cvs abg and rsbi eval neurostatus closely  CARDIOVASCULAR A: Septic Shock - Secondary to C-diff colitis; off pressors 2/08. Restarted 2/13 (relatd to volume?) Atrial Fibrillation with RVR.> Low [place] score. High output from JP's Clot around picc, dvt rue 2/11 Shock reoccurrence, related to sepsis? Volume? 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 Hx of ulcerative colitis. cholestatic picture conjugating, related to sepsis, voluem status?  P:   Tf  tolerated still [place] reviewed, no major findings, could consider HIDA, but safety of being out of ICU 6 hrs without direct observation on pressors? Also pressors improved pepcid Trend bili  HEMATOLOGIC A: Leukocytosis dvt picc aassociated rue 2/11 Small splenic hematoma? Thrombocytopenia r/o HITT 2/19 Drop hgb P:  Follow HITT assay Continue argatroban , way dose adjusted down for liver and critical illness Dopplers legs and lue to ensure - awaited Although on argatroban inr / pt will be false elevated, with liver failure, will follow inr, pt, can ask lab to correct?  INFECTIOUS A: C diff colitis, r/o line, r/o ischemia bowel driving? P:   Continue enteral vancomycin and IV flagyl  Vanc IV imipenem All lines are new With multiple studies RUQ, no dil gallbladder and improved pressors, will hold off hida Continue myco empric Follow repeat BC- NGTD 15th Will consult ID, role IVIG with such limited response initial cdiff  ENDOCRINE A: Hyperglycemia. uncontrolled Relative adrenal insufficiency. P:   SSI Lantus to increase 40, controlled, remain Solucortef 50 q12 to q6h, remain  NEUROLOGIC A:  Acute encephalopathy IMproved daily, severe debilliattion 2/16: not moving right side , lethargic , follows simple commands Will need MRI brain, still some weakness rue, at risk embolic cva P:   Improved, upright Int fent Avoid versed Support MAp   I have interviewed and examined the patient and reviewed the database. I have formulated the assessment and plan as reflected in the note above with amendments made by me. 35 mins of direct critical care time provided.   Mcarthur Rossetti. Tyson Alias, MD, FACP Pgr: [phone] [facility]

## 2012-03-16 ENCOUNTER — Inpatient Hospital Stay (HOSPITAL_COMMUNITY): Payer: BC Managed Care – PPO

## 2012-03-16 DIAGNOSIS — D649 Anemia, unspecified: Secondary | ICD-10-CM

## 2012-03-16 LAB — COMPREHENSIVE METABOLIC PANEL
ALT: 36 U/L (ref 0–53)
AST: 65 U/L — ABNORMAL HIGH (ref 0–37)
CO2: 21 mEq/L (ref 19–32)
Chloride: 103 mEq/L (ref 96–112)
GFR calc non Af Amer: 31 mL/min — ABNORMAL LOW (ref >90)
Sodium: 137 mEq/L (ref 135–145)
Total Bilirubin: 15.4 mg/dL — ABNORMAL HIGH (ref 0.3–1.2)

## 2012-03-16 LAB — CBC WITH DIFFERENTIAL/PLATELET
Eosinophils Absolute: 0 10*3/uL (ref 0.0–0.7)
MCH: 32.1 pg (ref 26.0–34.0)
MCHC: 32.6 g/dL (ref 30.0–36.0)
Monocytes Absolute: 1.2 10*3/uL — ABNORMAL HIGH (ref 0.1–1.0)
Neutrophils Relative %: 91 % — ABNORMAL HIGH (ref 43–77)
Platelets: 32 10*3/uL — ABNORMAL LOW (ref 150–400)

## 2012-03-16 LAB — CULTURE, BLOOD (ROUTINE X 2)

## 2012-03-16 LAB — GLUCOSE, CAPILLARY
Glucose-Capillary: 117 mg/dL — ABNORMAL HIGH (ref 70–99)
Glucose-Capillary: 121 mg/dL — ABNORMAL HIGH (ref 70–99)
Glucose-Capillary: 138 mg/dL — ABNORMAL HIGH (ref 70–99)
Glucose-Capillary: 193 mg/dL — ABNORMAL HIGH (ref 70–99)

## 2012-03-16 LAB — RENAL FUNCTION PANEL
Albumin: 2.4 g/dL — ABNORMAL LOW (ref 3.5–5.2)
CO2: 23 mEq/L (ref 19–32)
Chloride: 104 mEq/L (ref 96–112)
GFR calc Af Amer: 31 mL/min — ABNORMAL LOW (ref >90)
GFR calc non Af Amer: 27 mL/min — ABNORMAL LOW (ref >90)
Potassium: 3.7 mEq/L (ref 3.5–5.1)

## 2012-03-16 LAB — APTT
aPTT: 84 seconds — ABNORMAL HIGH (ref 24–37)
aPTT: 70 seconds — ABNORMAL HIGH (ref 24–37)

## 2012-03-16 LAB — CBC
MCHC: 33.2 g/dL (ref 30.0–36.0)
MCV: 96.6 fL (ref 78.0–100.0)
Platelets: 35 10*3/uL — ABNORMAL LOW (ref 150–400)
RDW: 22.6 % — ABNORMAL HIGH (ref 11.5–15.5)
WBC: 21.2 10*3/uL — ABNORMAL HIGH (ref 4.0–10.5)

## 2012-03-16 LAB — PROTIME-INR: Prothrombin Time: 41.3 seconds — ABNORMAL HIGH (ref 11.6–15.2)

## 2012-03-16 LAB — PHOSPHORUS: Phosphorus: 5.9 mg/dL — ABNORMAL HIGH (ref 2.3–4.6)

## 2012-03-16 MED ORDER — PRO-STAT SUGAR FREE PO LIQD
30.0000 mL | Freq: Three times a day (TID) | ORAL | Status: DC
  Administered 2012-03-16 – 2012-03-21 (×13): 30 mL
  Filled 2012-03-16 (×16): qty 30

## 2012-03-16 MED ORDER — VITAL 1.5 CAL PO LIQD
1000.0000 mL | ORAL | Status: DC
  Administered 2012-03-16 – 2012-03-20 (×5): 1000 mL
  Filled 2012-03-16 (×8): qty 1000

## 2012-03-16 MED ORDER — ALBUMIN HUMAN 25 % IV SOLN
25.0000 g | Freq: Two times a day (BID) | INTRAVENOUS | Status: AC
  Administered 2012-03-16 (×2): 12.5 g via INTRAVENOUS
  Administered 2012-03-16: 25 g via INTRAVENOUS
  Filled 2012-03-16 (×2): qty 100

## 2012-03-16 NOTE — Progress Notes (Addendum)
PULMONARY  / CRITICAL CARE MEDICINE  Name: Connor Small MRN: [id number] DOB: [birth year]    ADMISSION DATE:  02/26/2012 CONSULTATION DATE:  2/5  REFERRING MD :  Karilyn Cota  CHIEF COMPLAINT:  Acute resp failure   BRIEF PATIENT DESCRIPTION:  71 YOM admitted to [facility] for Cdiff on 2/2. Underwent decompressive cecostomy 2/3, initially was better, then worsened on 2/4 with increased abdominal distension and tenderness, decreased renal function.  He was taken back to the OR on 2/4 for total abdominal colectomy and end ileostomy.  Post op he developed ARDS and sepsis shock and was being treated for this.  The morning of 2/6 he had new onset of A-fib and transferred to [facility].    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 Overlying bowel gas and patientclinical status. 2. No explanation for elevated liver function tests.3. Similar to slight increase in size of a perisplenic fluidcollection.4. Right pleural effusion 2/20- reduction pressors, improved alertness 2/21- bleeding overnight from JP's, argatroban turned off, lower pressor needs   LINES / TUBES: ETT - OSH 2/4>>> Foley-OSH 2/3>>> PICC OSH  2/4>>>2/11 Art Line - OSH 2/5>>>out A line rt fem 2/18>>> Rt ij HD 2/17>>> Left IJ 2/18>>>  CULTURES: BCx2 2/4>>>negative BCx2 2/3>>>negative UC 2/3- Negative MRSA PCR 2/3- Negative C diff 2/6>>>Positive Body fluid 2/9>>>WBCs, no organisms 2/15 BC x 2 > 2/15 Sputum >>>NF  ANTIBIOTICS: Flagyl 2/3>>> Vanc 2/6 (IV) >>>2/7 azactam 2/6>>>2/7 Oral vanc 2/6>>> IV vanc 2/16 > Aztreonam 2/16 >2/17 Imipenem 2/17>>> mycofungin 2/18>>>  SUBJECTIVE:  levo down  VITAL SIGNS: Temp:  [97.6 F (36.4 C)-97.7 F (36.5 C)] 97.7 F (36.5 C) (02/21 0344) Pulse Rate:  [81-110] 92 (02/21 0700) Resp:  [12-34] 24 (02/21 0700) BP: (77-108)/(37-59) 97/54 mmHg (02/21 0700) SpO2:  [100 %] 100 % (02/21 0700) Arterial Line BP: (90-127)/(42-64) 104/53 mmHg (02/21 0700) Weight:  [87.1 kg (192 lb 0.3 oz)] 87.1 kg (192 lb 0.3 oz) (02/21 0500) HEMODYNAMICS: CVP:  [4 mmHg-5 mmHg] 4 mmHg VENTILATOR SETTINGS:   INTAKE / OUTPUT: Intake/Output     02/20 0701 - 02/21 0700 02/21 0701 - 02/22 0700   I.V. (mL/kg) 652.9 (7.5)    NG/GT 560    IV Piggyback 1100    Total Intake(mL/kg) 2312.9 (26.6)    Urine (mL/kg/hr) 610 (0.3)    Drains 905 (0.4)    Other 103 (0)    Stool 1500 (0.7)    Total Output 3118     Net -805.1            PHYSICAL EXAMINATION: General awake, alert, nonfocal Neuro: awake, weakness rue maybe compared to left, general debillitation HEENT: lines clean Cardiovascular: s1 s2 irregular Lungs: diminshed BS bass, clear anterior Abdomen: nonDistended,  wound site clean and dry, hypoBS  At best, ostomy unchange, belly soft Musculoskeletal: 1+ edema Skin: follicular mild anterior rt thigh rash  LABS:  Recent Labs Lab 03/11/12 0620 03/12/12 0455  03/13/12 0700  03/14/12 0405 03/14/12 1123  03/15/12 0450 03/15/12 0458  03/15/12 1100 03/15/12 1600 03/15/12 1810 03/15/12 1930 03/16/12 0100 03/16/12 0450  HGB 15.3 16.4  < >  --   --  16.0  --   --  12.4*  --   --   --   --   10.7*  --  10.5* 10.6*  WBC 41.2* 39.4*  < >  --   --  29.0*  --   --  27.1*  --   --   --   --  21.5*  --  21.2* 20.1*  PLT 289 232  < >  --   --  105*  --   --  58*  --   --   --   --  34*  --  35* 32*  NA 133* 131*  < >  --   < > 130*  --   < > 135  --   --   --  135  --   --   --  137  K 5.0 5.8*  < >  --   < > 4.5  --   < > 4.7  --   --   --  4.3  --   --   --  4.1  CL 102 99  < >  --   < > 97  --   < > 99  --   --   --  100  --   --   --  103  CO2 19 17*  < >  --   < > 22  --   < > 24  --   --   --  23  --   --   --  21  GLUCOSE 243* 143*  < >  --   < > 224*  --   < > 143*  --   --   --  116*  --   --   --  125*  BUN 130* 158*  < >  --   < > 91*  --   < > 78*  --   --   --  91*  --   --   --  112*  CREATININE 2.35* 2.74*  < >  --   < > 1.67*  --   < > 1.58*  --   --   --  1.97*  --   --   --  2.29*  CALCIUM 7.2* 7.0*  < >  --   < > 7.0*  --   < > 7.8*  --   --   --  7.6*  --   --   --  7.8*  MG 2.5  --   < >  --   --  2.6*  --   --  2.8*  --   --   --   --   --   --   --  3.0*  PHOS 5.6*  --   < >  --   < > 6.0*  --   < > 5.1*  --   --   --  5.4*  --   --   --  5.9*  AST 129* 117*  < >  --   --  75*  --   --  62*  --   --   --   --   --   --   --  65*  ALT 55* 61*  < >  --   --  59*  --   --  43  --   --   --   --   --   --   --  36  ALKPHOS 710* 639*  < >  --   --  603*  --   --  386*  --   --   --   --   --   --   --  377*  BILITOT 3.9* 7.0*  < > 13.9*  --  13.9*  --   --  15.5*  --   --   --   --   --   --   --  15.4*  PROT 4.1* 4.4*  < >  --   --  4.6*  --   --  4.6*  --   --   --   --   --   --   --  4.2*  ALBUMIN 1.3* 1.4*  < >  --   < > 1.6*  --   < > 2.5*  --   --   --  2.5*  --   --   --  2.4*  APTT  --   --   < > 56*  --  163*  --   < >  --   --   < >  --  101*  --  101*  --  84*  INR  --   --   < > 1.51*  --  1.55*  --   --   --   --   --   --   --   --   --   --  4.71*  LATICACIDVEN  --   --   --  3.3*  --   --   --   --   --   --   --   --   --   --   --   --   --    PROCALCITON 4.22 5.70  --   --   --   --   --   --   --   --   --   --   --   --   --   --   --   PHART  --   --   --   --   < >  --  7.416  --   --  7.440  --  7.475*  --   --   --   --   --   PCO2ART  --   --   --   --   < >  --  35.0  --   --  36.8  --  32.2*  --   --   --   --   --   PO2ART  --   --   --   --   < >  --  154.0*  --   --  194.0*  --  130.0*  --   --   --   --   --   < > = values in this interval not displayed.  Recent Labs Lab 03/15/12 1151 03/15/12 1625 03/15/12 2032 03/16/12 0039 03/16/12 0343  GLUCAP 133*  120* 150* 138* 125*    Imaging: US Abdomen Complete  03/14/2012  *RADIOLOGY REPORT*  Clinical Data:  Elevated liver function tests.  Patient on ventilator.  COMPLETE ABDOMINAL ULTRASOUND  Comparison:  03/12/2012  Findings:  Gallbladder:  No gallstones, pericholecystic fluid.  The technologist measured an area of apparent wall thickening at up to 7 mm.  This is felt to be an overestimation, as the remainder of the wall is normal in thickness, at up to 3 mm. Sonographic Murphy's sign was not elicited.  Common bile duct: Normal, 5 mm.  Liver: Normal in echogenicity, without focal lesion.  IVC: Negative  Pancreas:  Obscured by bowel gas.  Spleen:  Normal in size and echogenicity.  Perisplenic fluid collection is again identified.  Similar to minimally enlarged since the prior exam.  Right Kidney:  11.7 cm. No hydronephrosis.  Left Kidney:  13.8 cm. Poorly visualized due to overlying bowel gas.  Abdominal aorta:  Partially obscured by bowel gas.  No free abdominal ascites identified.  Incidental note is made of right-sided pleural effusion.  IMPRESSION:  1.  Multifactorial degradation.  Overlying bowel gas and patient clinical status. 2.  No explanation for elevated liver function tests. 3.  Similar to slight increase in size of a perisplenic fluid collection. 4.  Right pleural effusion.   Original Report Authenticated By: Jeronimo Greaves, M.D.    Dg Chest Port 1 View  03/15/2012   *RADIOLOGY REPORT*  Clinical Data: Evaluate pulmonary edema.  PORTABLE CHEST - 1 VIEW  Comparison: Chest x-ray 03/14/2012.  Findings: An endotracheal tube is in place with tip 4.1 cm above the carina. There is a left-sided internal jugular central venous catheter with tip terminating in the distal superior vena cava. There is a right-sided internal jugular central venous catheter with tip terminating in the mid superior vena cava. A nasogastric tube is seen extending into the stomach, however, the tip of the nasogastric tube extends below the lower margin of the image.  Lung volumes are low.  Ill-defined veil like opacity in the lower right hemithorax is most compatible with a small right pleural effusion. There is likely some associated subsegmental atelectasis in the right lower lobe.  Left lung is clear.  Pulmonary vasculature is normal.  Heart size is normal.  Mediastinal contours are unremarkable.  IMPRESSION: 1.  Support apparatus, as above. 2.  Small right pleural effusion with right lower lobe subsegmental atelectasis.   Original Report Authenticated By: Trudie Reed, M.D.    CXR 2/20 >mild hazziness rt base, ett wnl None new  ASSESSMENT / PLAN:  PULMONARY A: Acute respiratory failure in setting of septic shock and volume overload.  Lt pleural effusion.  Extubated 2/20  P:   IS Low O2 needs No role thora OK with pos balance  CARDIOVASCULAR A: Septic Shock - Secondary to C-diff colitis; off pressors 2/08. Restarted 2/13 (relatd to volume?) Atrial Fibrillation with RVR.> Low [place] score. High output from JP's Clot around picc, dvt rue 2/11 Shock reoccurrence, related to sepsis? Volume? 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 Hx of ulcerative colitis. cholestatic picture conjugating, related to sepsis, voluem status?  P:   Tf tolerated still, continue at goal pepcid Trend bili, has plat now slp needed, ett for 3 weeks Increase protein>>?  HEMATOLOGIC A: Leukocytosis dvt picc aassociated rue 2/11 Small splenic hematoma? Thrombocytopenia r/o HITT 2/19 Drop hgb P:  Follow HITT assay- will call lab Continue to hold argatroban with bloody drainage, coags in am , likely continued anticoagulation affect in critical illness and liver enzyme changes Dopplers legs - neg  INFECTIOUS A: C diff colitis, clinically progressing P:   Continue enteral vancomycin and IV flagyl  Vanc IV imipenem Continue myco empiric - do we add 7 days stop date to this?, could consider dc, will d/w ID Appreciate ID  ENDOCRINE A: Hyperglycemia. controlled Relative adrenal insufficiency. P:   SSI Lantus to increase 40, controlled, remain Solucortef 50 q12 to q6h, remain until off pressors  NEUROLOGIC A:  Acute encephalopathy IMproved daily, severe debilliattion 2/16: not moving right side , lethargic , follows simple commands Will need MRI brain, still some weakness rue, at risk embolic cva P:   Improved, upright Aggressive pt/ ot, splints? Support MAp, neurostatus is sensitive Order MRi if not improved rue Neuro to see carotids  Lovely wife and daughter at bedside and updated  I have interviewed and examined the patient and reviewed the database. I have formulated the assessment and plan as reflected in the note above with amendments made by me. 30 mins of direct critical care time provided.   Mcarthur Rossetti. Tyson Alias, MD, FACP Pgr:  [phone] [facility]

## 2012-03-16 NOTE — Consult Note (Signed)
Reason for Consult: Global weakness, decreased RUE movement Referring Physician: Dr. Titus Mould  CC: Global weakness, decreased RUE movement.  HPI: Connor Small is an 52 y.o. male with a history of ulcerative colitis who initially presented to [facility] with complaints of fever, low back pain and diarrhea.  He was found to have severe C. Diff with toxic megacolon.  He initially underwent cecostomy but had progressive sepsis and subsequently underwent total colectomy with end ileostomy on 2/4.  He continued to decompensate and was transferred to [facility] on 2/6.  He was intubated from 2/4 to 2/20 and required CVVHD from 2/17 to 2/20 because of progressive renal failure.  He required pressors initially from 2/6 to 2/8 and then was restarted on 2/13 and currently requiring Levophed for continued hypotension though they have been able to titrate over the last few days down to 2 mcg/min today.  He also has been on stress dose steroids since 2/6.  Neurology was consulted because of prolonged poor mental status, global weakness, and decreased movement of the right upper extremity.   Past Medical History  Diagnosis Date  . Chronic diarrhea   . Rectal bleed   . Hemorrhoids   . Ulcerative colitis     Distal UC over 8 yrs ago diagnosed  . Diverticulitis of large intestine with perforation 10/2011    done at [facility]   Past Surgical History  Procedure Laterality Date  . Colonoscopy  9/03  . Sigmoidoscopy       06/13/2002  . Temporary ostomy November 06, 2010      for a colon perforation that was done in [facility] (Dr Payton Doughty).    . Colonoscopy  02/16/2011    Procedure: COLONOSCOPY;  Surgeon: Rogene Houston, MD;  Location: [facility] ENDO SUITE;  Service: Endoscopy;  Laterality: N/A;  100  . Flexible sigmoidoscopy  02/27/2012    Procedure: FLEXIBLE SIGMOIDOSCOPY;  Surgeon: Rogene Houston, MD;  Location: [facility] ENDO SUITE;  Service: Endoscopy;  Laterality: N/A;  with colonic decompression  .  Laparotomy  02/27/2012    Procedure: EXPLORATORY LAPAROTOMY;  Surgeon: Donato Heinz, MD;  Location: [facility] ORS;  Service: General;  Laterality: N/A;  . Cecostomy  02/27/2012    Procedure: CECOSTOMY;  Surgeon: Donato Heinz, MD;  Location: [facility] ORS;  Service: General;  Laterality: N/A;  Cecostomy Tube Placement  . Colectomy  02/28/2012    Procedure: TOTAL COLECTOMY;  Surgeon: Donato Heinz, MD;  Location: [facility] ORS;  Service: General;  Laterality: N/A;  . Ileostomy  02/28/2012    Procedure: ILEOSTOMY;  Surgeon: Donato Heinz, MD;  Location: [facility] ORS;  Service: General;  Laterality: N/A;   Family History  Problem Relation Age of Onset  . Cancer Sister   . Healthy Daughter   . Hypertension Mother    Social History:  reports that he quit smoking about 27 years ago. His smoking use included Cigarettes. He smoked 0.00 packs per day. He has never used smokeless tobacco. He reports that he does not drink alcohol or use illicit drugs.  Allergies  Allergen Reactions  . Penicillins Other (See Comments)    Heart rate changes   Medications: I have reviewed the patient's current medications. Medications Prior to Admission  Medication Sig Dispense Refill  . bifidobacterium infantis (ALIGN) capsule Take 1 capsule by mouth daily.  14 capsule  0  . FORTESTA 10 MG/ACT (2%) GEL at bedtime. Patient states that he applies it to his leg      .  ibuprofen (ADVIL,MOTRIN) 200 MG tablet Take 400 mg by mouth 2 (two) times daily as needed. For pain      . Mesalamine (ASACOL HD) 800 MG TBEC Take 2 tablets (1,600 mg total) by mouth 2 (two) times daily.  180 tablet  3  . Pseudoeph-Doxylamine-DM-APAP (NYQUIL D COLD/FLU) 60-12.06-22-998 MG/30ML LIQD Take 30 mLs by mouth at bedtime. Cold/flu symptoms      . Pseudoephedrine-APAP-DM (DAYQUIL MULTI-SYMPTOM COLD/FLU PO) Take 1 capsule by mouth every 4 (four) hours as needed. Cold symptoms       ROS: ROS unable to be obtained secondary to patient condition.   Physical  Examination: Blood pressure 81/45, pulse 100, temperature 97 F (36.1 C), temperature source Oral, resp. rate 27, height 5' 9"  (1.753 m), weight 192 lb 0.3 oz (87.1 kg), SpO2 100.00%. Constitutional: Vital signs reviewed.  Patient is a chronically ill appearing man in no acute distress and cooperative with exam. Alert and will attend but unable to verbally answer questions.  He is able to nod and shake to questions.  Head: Normocephalic and atraumatic Ear: TM normal bilaterally Mouth: no erythema or exudates, MMM, palate appears jaundiced.  Eyes: PERRL, EOMI, conjunctivae normal, moderate scleral icterus.  Neck: Supple, Trachea midline normal ROM, No JVD, mass, thyromegaly, or carotid bruit present.  Cardiovascular: tachycardic rate but regular rhythm, S1 normal, S2 normal, no MRG, pulses symmetric and intact bilaterally Pulmonary/Chest: CTAB, no wheezes, rales, or rhonchi Abdominal: Soft. Non-tender, non-distended, bowel sounds are present. Ileostomy intact with stool and gas in the bag. no masses, organomegaly, or guarding present.  Musculoskeletal: 3+ pitting edema in the bilateral upper and lower extremity.   Skin: Warm, dry and intact. No rash, cyanosis, or clubbing.    Neurologic Examination Mental Status: Alert, and able to answer yes and no questions.  Speech is absent but family states that he has spoken to them a few times. Able to follow 2 step commands without difficulty. Cranial Nerves: II: visual fields grossly intact, pupils equal, round, reactive to light and accommodation III,IV, VI: ptosis not present, extra-ocular motions intact bilaterally V,VII: smile symmetric, facial light touch sensation normal bilaterally VIII: hearing normal bilaterally IX,X: gag reflex present XI: trapezius strength/neck flexion strength 1+ but symmetric bilaterally XII: tongue strength normal  Motor: Right : Upper extremity    Left:     Upper extremity 0/5 deltoid       2/5 deltoid 0/5  biceps      2/5 biceps  0/5 triceps      2/5 triceps 2/5wrist flexion     3+/5 wrist flexion 2/5 wrist extension     3+/5 wrist extension 3/5 hand grip      3+/5 hand grip  Lower extremity     Lower extremity 1/5 hip flexor      2/5 hip flexor 1/5 hip adductors     2/5 hip adductors 1/5 hip abductors     2/5 hip abductors 1/5 quadricep      2/5 quadriceps  1/5 hamstrings     2/5 hamstrings 4/5 plantar flexion       4/5 plantar flexion 4/5 plantar extension     4/5 plantar extension Tone is smooth with atrophy noted in the upper and lower extremities bilaterally  Sensory: Pinprick and light touch intact throughout, bilaterally Deep Tendon Reflexes: Biceps reflex 2+ bilaterally, Brachioradialis +2 bilaterally, patellar 2+ bilaterally,  Plantars: Right: downgoing   Left: downgoing Cerebellar: FTN unable to be tested, heel to shin unable to be tested.  Laboratory Studies:   Basic Metabolic Panel:  Recent Labs Lab 03/11/12 0620  03/13/12 0445  03/14/12 0405 03/14/12 1600 03/15/12 0450 03/15/12 1600 03/16/12 0450  NA 133*  < > 132*  < > 130* 132* 135 135 137  K 5.0  < > 4.6  < > 4.5 4.6 4.7 4.3 4.1  CL 102  < > 91*  < > 97 98 99 100 103  CO2 19  < > 29  < > 22 23 24 23 21   GLUCOSE 243*  < > 236*  < > 224* 146* 143* 116* 125*  BUN 130*  < > 113*  < > 91* 78* 78* 91* 112*  CREATININE 2.35*  < > 1.87*  < > 1.67* 1.61* 1.58* 1.97* 2.29*  CALCIUM 7.2*  < > 7.0*  < > 7.0* 7.5* 7.8* 7.6* 7.8*  MG 2.5  --  2.4  --  2.6*  --  2.8*  --  3.0*  PHOS 5.6*  < > 6.3*  < > 6.0* 5.7* 5.1* 5.4* 5.9*  < > = values in this interval not displayed.  Liver Function Tests:  Recent Labs Lab 03/12/12 0455  03/13/12 0445 03/13/12 0700  03/14/12 0405 03/14/12 1600 03/15/12 0450 03/15/12 1600 03/16/12 0450  AST 117*  --  82*  --   --  75*  --  62*  --  65*  ALT 61*  --  62*  --   --  59*  --  43  --  36  ALKPHOS 639*  --  680*  --   --  603*  --  386*  --  377*  BILITOT 7.0*  --  13.2* 13.9*   --  13.9*  --  15.5*  --  15.4*  PROT 4.4*  --  4.6*  --   --  4.6*  --  4.6*  --  4.2*  ALBUMIN 1.4*  < > 1.6*  --   < > 1.6* 2.4* 2.5* 2.5* 2.4*  < > = values in this interval not displayed.  CBC:  Recent Labs Lab 03/10/12 0534  03/13/12 0445 03/14/12 0405 03/15/12 0450 03/15/12 1810 03/16/12 0100 03/16/12 0450  WBC 42.2*  < > 28.7* 29.0* 27.1* 21.5* 21.2* 20.1*  NEUTROABS 34.2*  --  23.6* 26.1* 23.3*  --   --  18.3*  HGB 15.3  < > 16.6 16.0 12.4* 10.7* 10.5* 10.6*  HCT 43.7  < > 49.2 49.0 38.3* 32.6* 31.6* 32.5*  MCV 94.8  < > 96.1 97.0 97.5 98.2 96.6 98.5  PLT 317  < > 148* 105* 58* 34* 35* 32*  < > = values in this interval not displayed.  CBG:  Recent Labs Lab 03/15/12 1625 03/15/12 2032 03/16/12 0039 03/16/12 0343 03/16/12 0753  GLUCAP 120* 150* 138* 125* 106*   Microbiology: Results for orders placed during the hospital encounter of 02/26/12  CULTURE, BLOOD (ROUTINE X 2)     Status: None   Collection Time    02/27/12  4:58 AM      Result Value Range Status   Specimen Description BLOOD RIGHT ARM   Final   Special Requests BOTTLES DRAWN AEROBIC AND ANAEROBIC 6CC   Final   Culture NO GROWTH 6 DAYS   Final   Report Status 03/04/2012 FINAL   Final  CULTURE, BLOOD (ROUTINE X 2)     Status: None   Collection Time    02/27/12  5:15 AM      Result  Value Range Status   Specimen Description BLOOD RIGHT HAND   Final   Special Requests BOTTLES DRAWN AEROBIC AND ANAEROBIC 6CC   Final   Culture NO GROWTH 6 DAYS   Final   Report Status 03/04/2012 FINAL   Final  SURGICAL PCR SCREEN     Status: None   Collection Time    02/27/12  3:15 PM      Result Value Range Status   MRSA, PCR NEGATIVE  NEGATIVE Final   Staphylococcus aureus NEGATIVE  NEGATIVE Final   Comment:            The Xpert SA Assay (FDA     approved for NASAL specimens     in patients over 4 years of age),     is one component of     a comprehensive surveillance     program.  Test performance has      been validated by [employer] for patients greater     than or equal to 76 year old.     It is not intended     to diagnose infection nor to     guide or monitor treatment.  URINE CULTURE     Status: None   Collection Time    02/28/12 10:03 AM      Result Value Range Status   Specimen Description URINE, CATHETERIZED   Final   Special Requests NONE   Final   Culture  Setup Time 02/28/2012 19:00   Final   Colony Count NO GROWTH   Final   Culture NO GROWTH   Final   Report Status 02/29/2012 FINAL   Final  CULTURE, BLOOD (ROUTINE X 2)     Status: None   Collection Time    02/28/12 12:43 PM      Result Value Range Status   Specimen Description BLOOD LEFT ARM   Final   Special Requests BOTTLES DRAWN AEROBIC AND ANAEROBIC 8CC   Final   Culture NO GROWTH 5 DAYS   Final   Report Status 03/04/2012 FINAL   Final  CULTURE, BLOOD (ROUTINE X 2)     Status: None   Collection Time    02/28/12  1:39 PM      Result Value Range Status   Specimen Description BLOOD LEFT ANTECUBITAL   Final   Special Requests BOTTLES DRAWN AEROBIC AND ANAEROBIC [place]   Final   Culture NO GROWTH 5 DAYS   Final   Report Status 03/04/2012 FINAL   Final  MRSA PCR SCREENING     Status: None   Collection Time    03/01/12  2:51 PM      Result Value Range Status   MRSA by PCR NEGATIVE  NEGATIVE Final   Comment:            The GeneXpert MRSA Assay (FDA     approved for NASAL specimens     only), is one component of a     comprehensive MRSA colonization     surveillance program. It is not     intended to diagnose MRSA     infection nor to guide or     monitor treatment for     MRSA infections.  CLOSTRIDIUM DIFFICILE BY PCR     Status: Abnormal   Collection Time    03/01/12  3:38 PM      Result Value Range Status   C difficile by pcr POSITIVE (*) NEGATIVE Final  Comment: CRITICAL RESULT CALLED TO, READ BACK BY AND VERIFIED WITH:     WHITEK RN 17:05 03/01/12 ([email or web address])  BODY FLUID CULTURE     Status: None    Collection Time    03/04/12  3:34 PM      Result Value Range Status   Specimen Description PLEURAL FLUID   Final   Special Requests NONE   Final   Gram Stain     Final   Value: CYTOSPIN SLIDE WBC PRESENT,BOTH PMN AND MONONUCLEAR     NO ORGANISMS SEEN   Culture NO GROWTH 3 DAYS   Final   Report Status 03/09/2012 FINAL   Final  CULTURE, BLOOD (ROUTINE X 2)     Status: None   Collection Time    03/09/12 12:25 PM      Result Value Range Status   Specimen Description BLOOD LEFT HAND   Final   Special Requests BOTTLES DRAWN AEROBIC ONLY 2CC   Final   Culture  Setup Time 03/09/2012 18:19   Final   Culture NO GROWTH 5 DAYS   Final   Report Status 03/15/2012 FINAL   Final  CULTURE, BLOOD (ROUTINE X 2)     Status: None   Collection Time    03/10/12  5:10 PM      Result Value Range Status   Specimen Description BLOOD RIGHT ANTECUBITAL   Final   Special Requests BOTTLES DRAWN AEROBIC ONLY 5CC   Final   Culture  Setup Time 03/10/2012 21:33   Final   Culture NO GROWTH 5 DAYS   Final   Report Status 03/16/2012 FINAL   Final  CULTURE, RESPIRATORY (NON-EXPECTORATED)     Status: None   Collection Time    03/10/12  5:16 PM      Result Value Range Status   Specimen Description TRACHEAL ASPIRATE   Final   Special Requests Normal   Final   Gram Stain     Final   Value: ABUNDANT WBC PRESENT, PREDOMINANTLY PMN     RARE GRAM POSITIVE COCCI IN PAIRS   Culture Non-Pathogenic Oropharyngeal-type [place] Isolated.   Final   Report Status 03/13/2012 FINAL   Final   Coagulation Studies:  Recent Labs  03/14/12 0405 03/16/12 0450  LABPROT 18.1* 41.3*  INR 1.55* 4.71*   Urinalysis:  Recent Labs Lab 03/09/12 1057 03/10/12 1748  COLORURINE YELLOW AMBER*  LABSPEC 1.017 1.017  PHURINE 6.0 6.0  GLUCOSEU NEGATIVE NEGATIVE  HGBUR MODERATE* SMALL*  BILIRUBINUR NEGATIVE SMALL*  KETONESUR NEGATIVE NEGATIVE  PROTEINUR NEGATIVE NEGATIVE  UROBILINOGEN 0.2 0.2  NITRITE NEGATIVE NEGATIVE  LEUKOCYTESUR  NEGATIVE TRACE*   Lipid Panel:     Component Value Date/Time   CHOL 74 03/05/2012 0430   TRIG 154* 03/05/2012 0430   Other results: EKG: normal sinus rhythm, sinus tachycardia.  Imaging: 2/10 CT of the head without contrast:  Findings: The brain does not show any evidence of acute infarction,  mass lesion, hemorrhage, hydrocephalus or extra-axial collection.  Brainstem appears normal. There is an old cerebellar infarction on  the right. The cerebral hemispheres are normal except for some old  white matter infarctions in the left frontoparietal region. Bony  structures are unremarkable. Sinuses are clear. There is a small  amount of fluid in the mastoid air cells.  IMPRESSION:  No acute finding. 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  2/16 CT of the head without contrast: Findings: No acute intracranial abnormality. Specifically, no  hemorrhage, hydrocephalus, mass lesion, acute infarction, or  significant intracranial injury. No acute calvarial abnormality.  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  Stable small amount of fluid in the mastoid air cells bilaterally.  Paranasal sinuses are clear.  IMPRESSION:  Stable exam. No acute intracranial abnormality.  Assessment/Plan:  1. Right sided Weakness: Mr. Trejos has a global weakness that appears to be mildly worse on the right when compared to the left.  Head CT done on 2/16 did not show an acute abnormality  But did should an old infarct in the right cerebellar area as well as an old white matter infarct in the left frontoparietal area.  It is entirely possible that he had a stroke with his hypotension, sepsis, and right upper extremity DVT.  Other explanations for the increased weakness on the right compared to the left could be an acute worsening of a mild deficit from a previous infarct.    - MRI when able to travel to  radiology  - Continue to monitor neuro checks  - maximal PT, OT, and SLP as able  2. 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  He has several risk factors including extended ICU stay as well as extended glucocorticoid exposure due to his continued hypotension and diagnosis of ARDS.    - Maximal PT, OT, and SLP.  Trish Fountain, MD Internal Medicine Resident, PGY III Co-Chief Resident, Internal Medicine Pager: [phone] 03/16/2012 10:55 AM    I have seen and evaluated the patient. I have reviewed the above note and agree with the contents.   52 yo M with prolonged ICU stay, slowly imporving MS. He has significant weakness, and the assymetry has concerned the primary team for possible stroke.   He does not smoke.   1- 2 /5  Proximally on left, unable to move at all proximally on right, but is able to wiggle thumb to command.   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 Reasons for asymetry could be unmasking of previous deficits, but could also be a new stroke. When able, patient will need MRI brain. Anticoagulation would come with additional risk for hemorrhage in this gentleman if he does have a large stroke, but the risk would still be realtively small and if absolutely necessary, could be done though an MRI would be able ot much better characterize any risk.    Roland Rack, MD [facility] [phone]  If 7pm- 7am, please page neurology on call at [phone].

## 2012-03-16 NOTE — Progress Notes (Addendum)
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 DVT  Pharmacy Consult:  Vancomycin/Primaxin  Indication:  Septic shock  Allergies  Allergen Reactions  . Penicillins Other (See Comments)    Heart rate changes    Patient Measurements: Height: 5' 9"  (175.3 cm) Weight: 192 lb 0.3 oz (87.1 kg) IBW/kg (Calculated) : 70.7  Vital Signs: Temp: 97.7 F (36.5 C) (02/21 0344) Temp src: Oral (02/21 0800) BP: 98/52 mmHg (02/21 0800) Pulse Rate: 96 (02/21 0800)  Labs:  Recent Labs  03/14/12 0405  03/15/12 0450  03/15/12 1600 03/15/12 1810 03/15/12 1930 03/16/12 0100 03/16/12 0450  HGB 16.0  --  12.4*  --   --  10.7*  --  10.5* 10.6*  HCT 49.0  --  38.3*  --   --  32.6*  --  31.6* 32.5*  PLT 105*  --  58*  --   --  34*  --  35* 32*  APTT 163*  < >  --   < > 101*  --  101*  --  84*  LABPROT 18.1*  --   --   --   --   --   --   --  41.3*  INR 1.55*  --   --   --   --   --   --   --  4.71*  HEPARINUNFRC 0.32  --   --   --   --   --   --   --   --   CREATININE 1.67*  < > 1.58*  --  1.97*  --   --   --  2.29*  < > = values in this interval not displayed.  Estimated Creatinine Clearance: 40.8 ml/min (by C-G formula based on Cr of 2.29).  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  Patient with liver and renal dysfunction.  APTT is elevated at 84 with argatroban turned off last night, INR elevated to >4 likely residual effect of argatroban.   Argatroban remains on hold will follow along plan, no plans to resume CRRT today 2/21.  Septic shock/cdiff patient continues on primaxin, flagyl, po/iv vancomycin, micafungin. Patient is off CRRT and plan is to continue to hold today and reassess need in am, UOP minimal at 613 cc(0.3cc/kg/hr) with scr continuing to increase at 2.29. WBC down slightly but still elevated at 20.  Day 19 IV metronidazole  Day 19 oral vancomycin  Day 7 IV  vancomycin  Day 5 imipenem  Day 4 micafungin  2/4 Blood: negative 2/3 Blood: negative 2/4 Urine: negative 2/9 Pleural fluid: negative 2/14 UA - negative 2/15 blood - negative  Goal of Therapy:  aPTT 50-90 seconds Monitor platelets by anticoagulation protocol: Yes   Plan:  1. Hold argatroban for now 2. PTT every 12 hrs 3. Vancomycin level prior to dose this evening 4. No change to primaxin dosing  Erin Hearing PharmD 03/16/2012 8:33 AM

## 2012-03-16 NOTE — Progress Notes (Signed)
OT Cancellation Note  Treatment cancelled today due to pt with right femoral A line. Noted OOB order on 2/20 but would like clarification for OOB with femoral A line before proceeding per department protocol.  Thank you.  03/16/2012 Darrol Jump OTR/L Pager [phone] Office [phone]

## 2012-03-16 NOTE — Progress Notes (Signed)
Patient ID: Connor Small, male   DOB: [birth year], 52 y.o.   MRN: [id number] 17 Days Post-Op  Subjective: Pt is extubated.  Nods his head appropriately.  Wife and daughter at the bedside  Objective: Vital signs in last 24 hours: Temp:  [97.6 F (36.4 C)-97.7 F (36.5 C)] 97.7 F (36.5 C) (02/21 0344) Pulse Rate:  [81-110] 96 (02/21 0800) Resp:  [12-34] 30 (02/21 0800) BP: (77-108)/(37-59) 98/52 mmHg (02/21 0800) SpO2:  [100 %] 100 % (02/21 0800) Arterial Line BP: (90-123)/(42-64) 113/54 mmHg (02/21 0800) Weight:  [192 lb 0.3 oz (87.1 kg)] 192 lb 0.3 oz (87.1 kg) (02/21 0500) Last BM Date: 03/14/12  Intake/Output from previous day: 02/20 0701 - 02/21 0700 In: 2312.9 [I.V.:652.9; NG/GT:560; IV Piggyback:1100] Out: 3118 [Urine:610; Drains:905; Stool:1500] Intake/Output this shift:    PE: Abd: soft, NT, wound with pale pink tissue and some necrosis at the central portion of the wound towards the inferior portion.  Both drains mostly with serous output, but minimal bloody drainage.   NGT in place with TFs.  Ostomy with appropriate output  Lab Results:   Recent Labs  03/16/12 0100 03/16/12 0450  WBC 21.2* 20.1*  HGB 10.5* 10.6*  HCT 31.6* 32.5*  PLT 35* 32*   BMET  Recent Labs  03/15/12 1600 03/16/12 0450  NA 135 137  K 4.3 4.1  CL 100 103  CO2 23 21  GLUCOSE 116* 125*  BUN 91* 112*  CREATININE 1.97* 2.29*  CALCIUM 7.6* 7.8*   PT/INR  Recent Labs  03/14/12 0405 03/16/12 0450  LABPROT 18.1* 41.3*  INR 1.55* 4.71*   CMP     Component Value Date/Time   NA 137 03/16/2012 0450   K 4.1 03/16/2012 0450   CL 103 03/16/2012 0450   CO2 21 03/16/2012 0450   GLUCOSE 125* 03/16/2012 0450   BUN 112* 03/16/2012 0450   CREATININE 2.29* 03/16/2012 0450   CALCIUM 7.8* 03/16/2012 0450   PROT 4.2* 03/16/2012 0450   ALBUMIN 2.4* 03/16/2012 0450   AST 65* 03/16/2012 0450   ALT 36 03/16/2012 0450   ALKPHOS 377* 03/16/2012 0450   BILITOT 15.4* 03/16/2012 0450   GFRNONAA 31*  03/16/2012 0450   GFRAA 36* 03/16/2012 0450   Lipase     Component Value Date/Time   LIPASE 9* 02/26/2012 1409       Studies/Results: US Abdomen Complete  03/14/2012  *RADIOLOGY REPORT*  Clinical Data:  Elevated liver function tests.  Patient on ventilator.  COMPLETE ABDOMINAL ULTRASOUND  Comparison:  03/12/2012  Findings:  Gallbladder:  No gallstones, pericholecystic fluid.  The technologist measured an area of apparent wall thickening at up to 7 mm.  This is felt to be an overestimation, as the remainder of the wall is normal in thickness, at up to 3 mm. Sonographic Murphy's sign was not elicited.  Common bile duct: Normal, 5 mm.  Liver: Normal in echogenicity, without focal lesion.  IVC: Negative  Pancreas:  Obscured by bowel gas.  Spleen:  Normal in size and echogenicity.  Perisplenic fluid collection is again identified.  Similar to minimally enlarged since the prior exam.  Right Kidney:  11.7 cm. No hydronephrosis.  Left Kidney:  13.8 cm. Poorly visualized due to overlying bowel gas.  Abdominal aorta:  Partially obscured by bowel gas.  No free abdominal ascites identified.  Incidental note is made of right-sided pleural effusion.  IMPRESSION:  1.  Multifactorial degradation.  Overlying bowel gas and patient clinical status. 2.  No  explanation for elevated liver function tests. 3.  Similar to slight increase in size of a perisplenic fluid collection. 4.  Right pleural effusion.   Original Report Authenticated By: Jeronimo Greaves, M.D.    Dg Chest Port 1 View  03/15/2012  *RADIOLOGY REPORT*  Clinical Data: Evaluate pulmonary edema.  PORTABLE CHEST - 1 VIEW  Comparison: Chest x-ray 03/14/2012.  Findings: An endotracheal tube is in place with tip 4.1 cm above the carina. There is a left-sided internal jugular central venous catheter with tip terminating in the distal superior vena cava. There is a right-sided internal jugular central venous catheter with tip terminating in the mid superior vena cava. A  nasogastric tube is seen extending into the stomach, however, the tip of the nasogastric tube extends below the lower margin of the image.  Lung volumes are low.  Ill-defined veil like opacity in the lower right hemithorax is most compatible with a small right pleural effusion. There is likely some associated subsegmental atelectasis in the right lower lobe.  Left lung is clear.  Pulmonary vasculature is normal.  Heart size is normal.  Mediastinal contours are unremarkable.  IMPRESSION: 1.  Support apparatus, as above. 2.  Small right pleural effusion with right lower lobe subsegmental atelectasis.   Original Report Authenticated By: Trudie Reed, M.D.     Anti-infectives: Anti-infectives   Start     Dose/Rate Route Frequency Ordered Stop   03/14/12 1000  micafungin (MYCAMINE) 100 mg in sodium chloride 0.9 % 100 mL IVPB     100 mg 100 mL/hr over 1 Hours Intravenous Daily 03/13/12 0632     03/13/12 1000  micafungin (MYCAMINE) 100 mg in sodium chloride 0.9 % 100 mL IVPB     100 mg 100 mL/hr over 1 Hours Intravenous Daily 03/13/12 0632 03/13/12 1311   03/12/12 1800  vancomycin (VANCOCIN) IVPB 1000 mg/200 mL premix     1,000 mg 200 mL/hr over 60 Minutes Intravenous Every 24 hours 03/12/12 0859     03/12/12 1000  imipenem-cilastatin (PRIMAXIN) 500 mg in sodium chloride 0.9 % 100 mL IVPB  Status:  Discontinued     500 mg 200 mL/hr over 30 Minutes Intravenous 3 times per day 03/12/12 0848 03/12/12 0909   03/12/12 1000  imipenem-cilastatin (PRIMAXIN) 250 mg in sodium chloride 0.9 % 100 mL IVPB     250 mg 200 mL/hr over 30 Minutes Intravenous 4 times per day 03/12/12 0909     03/11/12 1200  vancomycin (VANCOCIN) 750 mg in sodium chloride 0.9 % 150 mL IVPB  Status:  Discontinued     750 mg 150 mL/hr over 60 Minutes Intravenous Every 12 hours 03/11/12 0204 03/12/12 0858   03/11/12 0215  vancomycin (VANCOCIN) 750 mg in sodium chloride 0.9 % 150 mL IVPB     750 mg 150 mL/hr over 60 Minutes  Intravenous  Once 03/11/12 0204 03/11/12 0352   03/11/12 0215  aztreonam (AZACTAM) 1 g in dextrose 5 % 50 mL IVPB  Status:  Discontinued     1 g 100 mL/hr over 30 Minutes Intravenous 3 times per day 03/11/12 0204 03/12/12 0851   03/05/12 2200  metroNIDAZOLE (FLAGYL) IVPB 500 mg    Comments:  First dose ASAP   500 mg 100 mL/hr over 60 Minutes Intravenous Every 8 hours 03/05/12 1346     03/02/12 1200  vancomycin (VANCOCIN) 50 mg/mL oral solution 500 mg     500 mg Oral 4 times per day 03/02/12 3474  03/01/12 1445  vancomycin (VANCOCIN) 50 mg/mL oral solution 500 mg  Status:  Discontinued     500 mg Oral 3 times per day 03/01/12 1340 03/02/12 0913   03/01/12 1400  aztreonam (AZACTAM) 1 g in dextrose 5 % 50 mL IVPB  Status:  Discontinued     1 g 100 mL/hr over 30 Minutes Intravenous 3 times per day 03/01/12 1356 03/02/12 1146   03/01/12 1300  vancomycin (VANCOCIN) 1,250 mg in sodium chloride 0.9 % 250 mL IVPB  Status:  Discontinued     1,250 mg 166.7 mL/hr over 90 Minutes Intravenous Every 12 hours 03/01/12 1127 03/02/12 1146   02/29/12 0600  ciprofloxacin (CIPRO) IVPB 400 mg     400 mg 200 mL/hr over 60 Minutes Intravenous On call to O.R. 02/28/12 1409 02/28/12 1438   02/28/12 2200  vancomycin (VANCOCIN) 500 mg in sodium chloride irrigation 0.9 % 60 mL ENEMA  Status:  Discontinued     500 mg Rectal 2 times daily 02/28/12 1504 02/28/12 1817   02/28/12 1200  vancomycin (VANCOCIN) 50 mg/mL oral solution 500 mg  Status:  Discontinued    Comments:  First dose ASAP   500 mg Per Tube 4 times per day 02/28/12 0814 02/28/12 1817   02/28/12 1000  vancomycin (VANCOCIN) IVPB 1000 mg/200 mL premix  Status:  Discontinued     1,000 mg 200 mL/hr over 60 Minutes Intravenous Every 12 hours 02/28/12 0845 03/01/12 1127   02/28/12 1000  vancomycin (VANCOCIN) 500 mg in sodium chloride irrigation 0.9 % 100 mL ENEMA  Status:  Discontinued     500 mg Rectal 2 times daily 02/28/12 0923 02/28/12 1504   02/27/12  1600  metroNIDAZOLE (FLAGYL) IVPB 500 mg  Status:  Discontinued     500 mg 100 mL/hr over 60 Minutes Intravenous Every 8 hours 02/27/12 1546 02/27/12 2000   02/27/12 1600  ciprofloxacin (CIPRO) IVPB 400 mg  Status:  Discontinued     400 mg 200 mL/hr over 60 Minutes Intravenous Every 12 hours 02/27/12 1547 02/27/12 2000   02/27/12 1545  metroNIDAZOLE (FLAGYL) IVPB 500 mg  Status:  Discontinued     500 mg 100 mL/hr over 60 Minutes Intravenous  Once 02/27/12 1535 02/28/12 0843   02/27/12 1545  vancomycin (VANCOCIN) 500 mg in sodium chloride 0.9 % 100 mL IVPB  Status:  Discontinued     500 mg 100 mL/hr over 60 Minutes Intravenous  Once 02/27/12 1535 02/27/12 1538   02/27/12 1545  ciprofloxacin (CIPRO) IVPB 400 mg  Status:  Discontinued     400 mg 200 mL/hr over 60 Minutes Intravenous  Once 02/27/12 1538 02/28/12 0843   02/27/12 1415  vancomycin (VANCOCIN) 500 mg in sodium chloride 0.9 % 100 mL IVPB     500 mg 100 mL/hr over 60 Minutes Intravenous  Once 02/27/12 1402 02/27/12 1411   02/27/12 1400  vancomycin (VANCOCIN) powder 500 mg  Status:  Discontinued     500 mg Other To Surgery 02/27/12 1359 02/27/12 1402   02/27/12 1200  metroNIDAZOLE (FLAGYL) IVPB 500 mg  Status:  Discontinued    Comments:  First dose ASAP   500 mg 100 mL/hr over 60 Minutes Intravenous Every 6 hours 02/27/12 0953 03/05/12 1346   02/27/12 1200  vancomycin (VANCOCIN) 50 mg/mL oral solution 250 mg  Status:  Discontinued    Comments:  First dose ASAP   250 mg Oral 4 times per day 02/27/12 0953 02/28/12 0814   02/26/12 1700  oseltamivir (TAMIFLU) capsule 75 mg     75 mg Oral  Once 02/26/12 1651 02/26/12 1715       Assessment/Plan  1. c diff colitis, s/p subtotal colectomy 2, VDRF, now extubated 3. ARF, off CVVHD today 4. Thrombocytopenia, ? HIT  Plan: 1. Agree with swallow study today to evaluate swallow function.  If passes can advance oral diet and stop TFs.  If not, cont TF. 2. Cont dressing changes 3.   Argatroban has been held due to some bleeding in his drains.  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  At increased risk for bleeding, but looks good right now and trending hgbs are stable. 4. Cont to follow.  LOS: 19 days    Merrill Villarruel E 03/16/2012, 8:46 AM Pager: [phone]

## 2012-03-16 NOTE — Progress Notes (Signed)
Patient ID: Connor Small, male   DOB: 1960/05/01, 52 y.o.   MRN: [id number] S:pt extubated and off CVVHD.  Awake alert but fatigued. No complaints O:BP 105/57  Pulse 99  Temp(Src) 97.7 F (36.5 C) (Oral)  Resp 25  Ht 5\' 9"  (1.753 m)  Wt 87.1 kg (192 lb 0.3 oz)  BMI 28.34 kg/m2  SpO2 100%  Intake/Output Summary (Last 24 hours) at 03/16/12 0824 Last data filed at 03/16/12 0600  Gross per 24 hour  Intake 2244.57 ml  Output   3063 ml  Net -818.43 ml   Intake/Output: I/O last 3 completed shifts: In: 3779.2 [I.V.:1319.2; NG/GT:860; IV Piggyback:1600] Out: 4597 [Urine:690; Drains:1035; FIEPP:2951; Stool:1600]  Intake/Output this shift:    Weight change: -1.6 kg (-3 lb 8.4 oz) Gen:ill-appearing WM CVS:no rub Resp:bibasilar crackles Abd:no change Ext:+anasarca   Recent Labs Lab 03/10/12 0534  03/11/12 0620 03/12/12 0455  03/13/12 0445 03/13/12 1630 03/14/12 0405 03/14/12 1600 03/15/12 0450 03/15/12 1600 03/16/12 0450  NA 134*  < > 133* 131*  < > 132* 132* 130* 132* 135 135 137  K 4.8  < > 5.0 5.8*  < > 4.6 4.6 4.5 4.6 4.7 4.3 4.1  CL 102  < > 102 99  < > 91* 95* 97 98 99 100 103  CO2 20  < > 19 17*  < > 29 24 22 23 24 23 21   GLUCOSE 184*  < > 243* 143*  < > 236* 235* 224* 146* 143* 116* 125*  BUN 104*  < > 130* 158*  < > 113* 100* 91* 78* 78* 91* 112*  CREATININE 2.04*  < > 2.35* 2.74*  < > 1.87* 1.83* 1.67* 1.61* 1.58* 1.97* 2.29*  ALBUMIN 1.1*  --  1.3* 1.4*  < > 1.6* 1.5* 1.6* 2.4* 2.5* 2.5* 2.4*  CALCIUM 6.8*  < > 7.2* 7.0*  < > 7.0* 6.9* 7.0* 7.5* 7.8* 7.6* 7.8*  PHOS  --   < > 5.6*  --   < > 6.3* 6.2* 6.0* 5.7* 5.1* 5.4* 5.9*  AST 117*  --  129* 117*  --  82*  --  75*  --  62*  --  65*  ALT 41  --  55* 61*  --  62*  --  59*  --  43  --  36  < > = values in this interval not displayed. Liver Function Tests:  Recent Labs Lab 03/14/12 0405  03/15/12 0450 03/15/12 1600 03/16/12 0450  AST 75*  --  62*  --  65*  ALT 59*  --  43  --  36  ALKPHOS 603*  --  386*   --  377*  BILITOT 13.9*  --  15.5*  --  15.4*  PROT 4.6*  --  4.6*  --  4.2*  ALBUMIN 1.6*  < > 2.5* 2.5* 2.4*  < > = values in this interval not displayed. No results found for this basename: LIPASE, AMYLASE,  in the last 168 hours No results found for this basename: AMMONIA,  in the last 168 hours CBC:  Recent Labs Lab 03/14/12 0405 03/15/12 0450 03/15/12 1810 03/16/12 0100 03/16/12 0450  WBC 29.0* 27.1* 21.5* 21.2* 20.1*  NEUTROABS 26.1* 23.3*  --   --  18.3*  HGB 16.0 12.4* 10.7* 10.5* 10.6*  HCT 49.0 38.3* 32.6* 31.6* 32.5*  MCV 97.0 97.5 98.2 96.6 98.5  PLT 105* 58* 34* 35* 32*   Cardiac Enzymes: No results found for this basename:  CKTOTAL, CKMB, CKMBINDEX, TROPONINI,  in the last 168 hours CBG:  Recent Labs Lab 03/15/12 1151 03/15/12 1625 03/15/12 2032 03/16/12 0039 03/16/12 0343  GLUCAP 133* 120* 150* 138* 125*    Iron Studies: No results found for this basename: IRON, TIBC, TRANSFERRIN, FERRITIN,  in the last 72 hours Studies/Results: US Abdomen Complete  03/14/2012  *RADIOLOGY REPORT*  Clinical Data:  Elevated liver function tests.  Patient on ventilator.  COMPLETE ABDOMINAL ULTRASOUND  Comparison:  03/12/2012  Findings:  Gallbladder:  No gallstones, pericholecystic fluid.  The technologist measured an area of apparent wall thickening at up to 7 mm.  This is felt to be an overestimation, as the remainder of the wall is normal in thickness, at up to 3 mm. Sonographic Murphy's sign was not elicited.  Common bile duct: Normal, 5 mm.  Liver: Normal in echogenicity, without focal lesion.  IVC: Negative  Pancreas:  Obscured by bowel gas.  Spleen:  Normal in size and echogenicity.  Perisplenic fluid collection is again identified.  Similar to minimally enlarged since the prior exam.  Right Kidney:  11.7 cm. No hydronephrosis.  Left Kidney:  13.8 cm. Poorly visualized due to overlying bowel gas.  Abdominal aorta:  Partially obscured by bowel gas.  No free abdominal ascites  identified.  Incidental note is made of right-sided pleural effusion.  IMPRESSION:  1.  Multifactorial degradation.  Overlying bowel gas and patient clinical status. 2.  No explanation for elevated liver function tests. 3.  Similar to slight increase in size of a perisplenic fluid collection. 4.  Right pleural effusion.   Original Report Authenticated By: Jeronimo Greaves, M.D.    Dg Chest Port 1 View  03/15/2012  *RADIOLOGY REPORT*  Clinical Data: Evaluate pulmonary edema.  PORTABLE CHEST - 1 VIEW  Comparison: Chest x-ray 03/14/2012.  Findings: An endotracheal tube is in place with tip 4.1 cm above the carina. There is a left-sided internal jugular central venous catheter with tip terminating in the distal superior vena cava. There is a right-sided internal jugular central venous catheter with tip terminating in the mid superior vena cava. A nasogastric tube is seen extending into the stomach, however, the tip of the nasogastric tube extends below the lower margin of the image.  Lung volumes are low.  Ill-defined veil like opacity in the lower right hemithorax is most compatible with a small right pleural effusion. There is likely some associated subsegmental atelectasis in the right lower lobe.  Left lung is clear.  Pulmonary vasculature is normal.  Heart size is normal.  Mediastinal contours are unremarkable.  IMPRESSION: 1.  Support apparatus, as above. 2.  Small right pleural effusion with right lower lobe subsegmental atelectasis.   Original Report Authenticated By: Trudie Reed, M.D.    . antiseptic oral rinse  15 mL Mouth Rinse QID  . chlorhexidine  15 mL Mouth Rinse BID  . famotidine  20 mg Per Tube BID  . feeding supplement  60 mL Per Tube QID  . feeding supplement (VITAL AF 1.2 CAL)  1,000 mL Per Tube Q24H  . hydrocortisone sodium succinate  50 mg Intravenous Q6H  . imipenem-cilastatin  250 mg Intravenous Q6H  . insulin aspart  0-20 Units Subcutaneous Q4H  . insulin glargine  40 Units  Subcutaneous Daily  . lactulose  30 g Per Tube BID  . metronidazole  500 mg Intravenous Q8H  . micafungin (MYCAMINE) IV  100 mg Intravenous Daily  . multivitamin  5 mL Per Tube Daily  .  sodium chloride  10-40 mL Intracatheter Q12H  . vancomycin  500 mg Oral Q6H  . vancomycin  1,000 mg Intravenous Q24H    BMET    Component Value Date/Time   NA 137 03/16/2012 0450   K 4.1 03/16/2012 0450   CL 103 03/16/2012 0450   CO2 21 03/16/2012 0450   GLUCOSE 125* 03/16/2012 0450   BUN 112* 03/16/2012 0450   CREATININE 2.29* 03/16/2012 0450   CALCIUM 7.8* 03/16/2012 0450   GFRNONAA 31* 03/16/2012 0450   GFRAA 36* 03/16/2012 0450   CBC    Component Value Date/Time   WBC 20.1* 03/16/2012 0450   RBC 3.30* 03/16/2012 0450   HGB 10.6* 03/16/2012 0450   HCT 32.5* 03/16/2012 0450   PLT 32* 03/16/2012 0450   MCV 98.5 03/16/2012 0450   MCH 32.1 03/16/2012 0450   MCHC 32.6 03/16/2012 0450   RDW 22.6* 03/16/2012 0450   LYMPHSABS 0.6* 03/16/2012 0450   MONOABS 1.2* 03/16/2012 0450   EOSABS 0.0 03/16/2012 0450   BASOSABS 0.0 03/16/2012 0450    Assessment/Plan:  1. ARF- oliguric. Likely ischemic ATN in light of intravascular volume depletion, hypotension, and ongoing pressors. Currently off CVVHD and making urine. Some response to IV albumin, however very tenuous hemodynamics. Discussed with Dr. Tyson Alias and will cont to hold CVVHD and wean pressors.  2. VDRF- extubated yesterday. 3. C diff/s/p colectomy/iliostomy- per primary svc 4. SIRS- on pressors per PCCM 5. Hyperkalemia- improved with CVVHD. 6. Metabolic acidosis- as above.  7. Hyponatremia- due to anasarca/protein malnutrition. Resolved with CVVHD. Plan as above on hydrocortisone. 8. Abnormal LFT's- ?shock liver with rising LDH worrisome for ischemic bowel or visceral necrosis. Surgery following.  improving 9. Leukocytosis- on abx. 10. Anasarca- difficult to remove fluid but losing quite a bit through drains. Agree with albumin. 11. Dispo- cont with  aggressive measures for now. 12.  Cylie Dor A

## 2012-03-16 NOTE — Progress Notes (Signed)
Patient ID: Connor Small, male   DOB: [birth year], 52 y.o.   MRN: [id number]         [facility]    Date of Admission:  02/26/2012    Day 19 oral vancomycin        Day 19 IV metronidazole        Day 7 IV vancomycin        Day 5 imipenem        Day for micafungin Principal Problem:   Leukocytosis, unspecified Active Problems:   C. difficile colitis   Septic shock   Hyperbilirubinemia   UC (ulcerative colitis)   Colonic stricture   S/P cecostomy   ARF (acute renal failure)   Sinus tachycardia   Hematuria   Acute respiratory failure   Pleural effusion   Thrombocytopenia, unspecified   Atrial fibrillation   Anemia   . antiseptic oral rinse  15 mL Mouth Rinse QID  . chlorhexidine  15 mL Mouth Rinse BID  . famotidine  20 mg Per Tube BID  . feeding supplement  30 mL Per Tube TID  . hydrocortisone sodium succinate  50 mg Intravenous Q6H  . imipenem-cilastatin  250 mg Intravenous Q6H  . insulin aspart  0-20 Units Subcutaneous Q4H  . insulin glargine  40 Units Subcutaneous Daily  . lactulose  30 g Per Tube BID  . metronidazole  500 mg Intravenous Q8H  . micafungin (MYCAMINE) IV  100 mg Intravenous Daily  . sodium chloride  10-40 mL Intracatheter Q12H  . vancomycin  500 mg Oral Q6H  . vancomycin  1,000 mg Intravenous Q24H    Subjective: He is alert  Objective: Temp:  [97 F (36.1 C)-97.7 F (36.5 C)] 97.7 F (36.5 C) (02/21 1200) Pulse Rate:  [85-102] 99 (02/21 1645) Resp:  [12-30] 23 (02/21 1645) BP: (79-108)/(42-58) 89/50 mmHg (02/21 1600) SpO2:  [100 %] 100 % (02/21 1645) Arterial Line BP: (88-121)/(43-67) 120/67 mmHg (02/21 1645) Weight:  [87.1 kg (192 lb 0.3 oz)] 87.1 kg (192 lb 0.3 oz) (02/21 0500)  General: He nods appropriately Skin: No acute rash Lungs: Clear anteriorly Cor: Regular S1 and S2 no murmurs Abdomen: Soft and nontender   Lab Results Lab Results  Component Value Date   WBC 20.1* 03/16/2012   HGB 10.6* 03/16/2012     HCT 32.5* 03/16/2012   MCV 98.5 03/16/2012   PLT 32* 03/16/2012    Lab Results  Component Value Date   CREATININE 2.29* 03/16/2012   BUN 112* 03/16/2012   NA 137 03/16/2012   K 4.1 03/16/2012   CL 103 03/16/2012   CO2 21 03/16/2012    Lab Results  Component Value Date   ALT 36 03/16/2012   AST 65* 03/16/2012   ALKPHOS 377* 03/16/2012   BILITOT 15.4* 03/16/2012      Microbiology: Recent Results (from the past 240 hour(s))  CULTURE, BLOOD (ROUTINE X 2)     Status: None   Collection Time    03/09/12 12:25 PM      Result Value Range Status   Specimen Description BLOOD LEFT HAND   Final   Special Requests BOTTLES DRAWN AEROBIC ONLY 2CC   Final   Culture  Setup Time 03/09/2012 18:19   Final   Culture NO GROWTH 5 DAYS   Final   Report Status 03/15/2012 FINAL   Final  CULTURE, BLOOD (ROUTINE X 2)     Status: None   Collection Time    03/10/12  5:10 PM      Result Value Range Status   Specimen Description BLOOD RIGHT ANTECUBITAL   Final   Special Requests BOTTLES DRAWN AEROBIC ONLY 5CC   Final   Culture  Setup Time 03/10/2012 21:33   Final   Culture NO GROWTH 5 DAYS   Final   Report Status 03/16/2012 FINAL   Final  CULTURE, RESPIRATORY (NON-EXPECTORATED)     Status: None   Collection Time    03/10/12  5:16 PM      Result Value Range Status   Specimen Description TRACHEAL ASPIRATE   Final   Special Requests Normal   Final   Gram Stain     Final   Value: ABUNDANT WBC PRESENT, PREDOMINANTLY PMN     RARE GRAM POSITIVE COCCI IN PAIRS   Culture Non-Pathogenic Oropharyngeal-type Flora Isolated.   Final   Report Status 03/13/2012 FINAL   Final    Studies/Results: US Abdomen Complete  03/14/2012  *RADIOLOGY REPORT*  Clinical Data:  Elevated liver function tests.  Patient on ventilator.  COMPLETE ABDOMINAL ULTRASOUND  Comparison:  03/12/2012  Findings:  Gallbladder:  No gallstones, pericholecystic fluid.  The technologist measured an area of apparent wall thickening at up to 7 mm.  This is  felt to be an overestimation, as the remainder of the wall is normal in thickness, at up to 3 mm. Sonographic Murphy's sign was not elicited.  Common bile duct: Normal, 5 mm.  Liver: Normal in echogenicity, without focal lesion.  IVC: Negative  Pancreas:  Obscured by bowel gas.  Spleen:  Normal in size and echogenicity.  Perisplenic fluid collection is again identified.  Similar to minimally enlarged since the prior exam.  Right Kidney:  11.7 cm. No hydronephrosis.  Left Kidney:  13.8 cm. Poorly visualized due to overlying bowel gas.  Abdominal aorta:  Partially obscured by bowel gas.  No free abdominal ascites identified.  Incidental note is made of right-sided pleural effusion.  IMPRESSION:  1.  Multifactorial degradation.  Overlying bowel gas and patient clinical status. 2.  No explanation for elevated liver function tests. 3.  Similar to slight increase in size of a perisplenic fluid collection. 4.  Right pleural effusion.   Original Report Authenticated By: Jeronimo Greaves, M.D.    Dg Chest Port 1 View  03/15/2012  *RADIOLOGY REPORT*  Clinical Data: Evaluate pulmonary edema.  PORTABLE CHEST - 1 VIEW  Comparison: Chest x-ray 03/14/2012.  Findings: An endotracheal tube is in place with tip 4.1 cm above the carina. There is a left-sided internal jugular central venous catheter with tip terminating in the distal superior vena cava. There is a right-sided internal jugular central venous catheter with tip terminating in the mid superior vena cava. A nasogastric tube is seen extending into the stomach, however, the tip of the nasogastric tube extends below the lower margin of the image.  Lung volumes are low.  Ill-defined veil like opacity in the lower right hemithorax is most compatible with a small right pleural effusion. There is likely some associated subsegmental atelectasis in the right lower lobe.  Left lung is clear.  Pulmonary vasculature is normal.  Heart size is normal.  Mediastinal contours are  unremarkable.  IMPRESSION: 1.  Support apparatus, as above. 2.  Small right pleural effusion with right lower lobe subsegmental atelectasis.   Original Report Authenticated By: Trudie Reed, M.D.     Assessment: Overall, he is improving slowly after nearly 3 weeks of therapy for severe C. difficile colitis. He  developed low-grade fever, hypotension, and leukocytosis earlier this week. Have recurrent sepsis. All repeat cultures were negative and he has improved with broader empiric antimicrobial therapy. I will stop IV metronidazole now would recommend stopping oral vancomycin soon. I would continue his broader empiric therapy through the weekend and consider discontinuation early next week.  Plan: 1. Discontinue IV metronidazole 2. Consider stopping all other antimicrobial therapy first of next week 3. Please call Dr. Judyann Munson [phone]) for any infectious disease questions this weekend  Cliffton Asters, MD [facility] [phone] pager   [phone] cell 03/16/2012, 4:59 PM

## 2012-03-16 NOTE — Progress Notes (Signed)
NUTRITION FOLLOW UP  Intervention:    To better meet nutrition needs, will change formula to fluid-restricted, elemental formula, Vital 1.5. Initiate Vital 1.5 at 20 ml/hr, advance by 10 ml/hr every 8 hours, to goal of 50 ml/hr. Decrease Prostat to 30 ml Prostat TID. Goal regimen will now provide: 2100 kcal, 127 grams protein, 917 ml free water.  Discontinue liquid MVI daily  RD to follow for nutrition care plan  Nutrition Dx:   Inadequate oral intake related to inability to eat as evidenced by NPO status and s/p GI surgery, ongoing  Goal:   EN to provide 60-70% of estimated calorie needs (22-25 kcals/kg ideal body weight) and >/= 90% of estimated protein needs, based on ASPEN guidelines for permissive underfeeding in critically ill obese individuals, no longer applicable.  New Goal: Intake to meet >90% of estimated nutrition needs.  Monitor:   EN regimen & tolerance, respiratory status, weight, labs, I/O's  Assessment: 17 days s/p Procedure(s):  TOTAL COLECTOMY (N/A)  ILEOSTOMY (N/A)    CVVHD held yesterday 2/2 very tenuous hemodynamics. Extubated 2/20. Remains off CVVHD at this time. Making urine. Continues to be followed by renal. Evaluated by neuro today 2/2 prolonged poor mental status, global weakness, and decreased movement of the R upper extremity - MD with recommendations to do MRI once pt is able to travel to radiology.  MD ordered SLP eval, as pt had ETT for approximately 3 weeks. Plan for pt nutritionally is, if passes SLP eval can advance oral diet and stop TFs, if not, pt will continue TFs. BSE completed this morning by SLP with recommendations of NPO status; noting that pt is currently aphonic with weak cough; recommendations for FEES when pt is able to phonate.  Currently receiving Vital AF 1.2 formula at 20 ml/hr via NGT with Prostat liquid protein 60 ml 4 times daily providing 1376 total kcals, 156 gm protein, 389 ml of free water.  Receiving liquid MVI daily.   This  provides 69% of estimated kcal needs and 100% of estimated protein needs.  Patient's current BMI is now 28.3. Pt no longer meets criteria for permissive underfeeding guidelines. RD to adjust enteral nutrition rate as appropriate. Discussed with Apolonio Schneiders, RN.  Height: Ht Readings from Last 1 Encounters:  02/26/12 5' 9"  (1.753 m)    Weight Status:   Wt Readings from Last 1 Encounters:  03/16/12 192 lb 0.3 oz (87.1 kg)  Admit wt 200 lb - wt trending down  Body mass index is 28.34 kg/(m^2). Overweight.  Re-estimated needs:  Kcal: 2000 - 2300  Protein: 115 - 130 gm  Fluid: >/= 2.0 L  Skin: abdominal surgical incisions   Diet Order: NPO   Intake/Output Summary (Last 24 hours) at 03/16/12 1417 Last data filed at 03/16/12 1300  Gross per 24 hour  Intake 2280.82 ml  Output   4205 ml  Net -1924.18 ml    Last BM: 2/21  Labs:   Recent Labs Lab 03/14/12 0405  03/15/12 0450 03/15/12 1600 03/16/12 0450  NA 130*  < > 135 135 137  K 4.5  < > 4.7 4.3 4.1  CL 97  < > 99 100 103  CO2 22  < > 24 23 21   BUN 91*  < > 78* 91* 112*  CREATININE 1.67*  < > 1.58* 1.97* 2.29*  CALCIUM 7.0*  < > 7.8* 7.6* 7.8*  MG 2.6*  --  2.8*  --  3.0*  PHOS 6.0*  < > 5.1* 5.4* 5.9*  GLUCOSE 224*  < > 143* 116* 125*  < > = values in this interval not displayed.  CBG (last 3)   Recent Labs  03/16/12 0343 03/16/12 0753 03/16/12 1201  GLUCAP 125* 106* 193*    Scheduled Meds: . antiseptic oral rinse  15 mL Mouth Rinse QID  . chlorhexidine  15 mL Mouth Rinse BID  . famotidine  20 mg Per Tube BID  . feeding supplement  60 mL Per Tube QID  . feeding supplement (VITAL AF 1.2 CAL)  1,000 mL Per Tube Q24H  . hydrocortisone sodium succinate  50 mg Intravenous Q6H  . imipenem-cilastatin  250 mg Intravenous Q6H  . insulin aspart  0-20 Units Subcutaneous Q4H  . insulin glargine  40 Units Subcutaneous Daily  . lactulose  30 g Per Tube BID  . metronidazole  500 mg Intravenous Q8H  . micafungin  (MYCAMINE) IV  100 mg Intravenous Daily  . multivitamin  5 mL Per Tube Daily  . sodium chloride  10-40 mL Intracatheter Q12H  . vancomycin  500 mg Oral Q6H  . vancomycin  1,000 mg Intravenous Q24H    Continuous Infusions: . sodium chloride Stopped (03/13/12 0800)  . sodium chloride 20 mL/hr at 03/13/12 1211  . norepinephrine (LEVOPHED) Adult infusion 1 mcg/min (03/16/12 1238)  . vasopressin (PITRESSIN) infusion - *FOR SHOCK* Stopped (03/15/12 1287)   Inda Coke MS, RD, LDN Pager: [phone] After-hours pager: [phone]

## 2012-03-16 NOTE — Progress Notes (Signed)
I have seen and examined the patient and agree with the assessment and plans.  Dontasia Miranda A. Ninfa Linden  MD, FACS

## 2012-03-16 NOTE — Evaluation (Signed)
Clinical/Bedside Swallow Evaluation Patient Details  Name: Connor Small MRN: [id number] Date of Birth: 22-Mar-1960  Today's Date: 03/16/2012 Time: 1039-     Past Medical History:  Past Medical History  Diagnosis Date  . Chronic diarrhea   . Rectal bleed   . Hemorrhoids   . Ulcerative colitis     Distal UC over 8 yrs ago diagnosed  . Diverticulitis of large intestine with perforation 10/2011    done at [facility]   Past Surgical History:  Past Surgical History  Procedure Laterality Date  . Colonoscopy  9/03  . Sigmoidoscopy       06/13/2002  . Temporary ostomy November 06, 2010      for a colon perforation that was done in [facility] (Dr Ruben Im).    . Colonoscopy  02/16/2011    Procedure: COLONOSCOPY;  Surgeon: Malissa Hippo, MD;  Location: [facility];  Service: Endoscopy;  Laterality: N/A;  100  . Flexible sigmoidoscopy  02/27/2012    Procedure: FLEXIBLE SIGMOIDOSCOPY;  Surgeon: Malissa Hippo, MD;  Location: [facility];  Service: Endoscopy;  Laterality: N/A;  with colonic decompression  . Laparotomy  02/27/2012    Procedure: EXPLORATORY LAPAROTOMY;  Surgeon: Fabio Bering, MD;  Location: [facility] ORS;  Service: General;  Laterality: N/A;  . Cecostomy  02/27/2012    Procedure: CECOSTOMY;  Surgeon: Fabio Bering, MD;  Location: [facility] OR[facility];  Service: General;  Laterality: N/A;  Cecostomy Tube Placement  . Colectomy  02/28/2012    Procedure: TOTAL COLECTOMY;  Surgeon: Fabio Bering, MD;  Location: [facility] OR[facility];  Service: General;  Laterality: N/A;  . Ileostomy  02/28/2012    Procedure: ILEOSTOMY;  Surgeon: Fabio Bering, MD;  Location: [facility];  Service: General;  Laterality: N/A;   HPI:  27 YOM admitted to [facility] for Cdiff on 2/2. Underwent decompressive cecostomy 2/3, initially was better, then worsened on 2/4 with increased abdominal distension and tenderness, decreased renal function.  He was taken back to the OR on 2/4 for total abdominal colectomy and end ileostomy.  Post op he  developed ARDS and sepsis shock and was being treated for this.  The morning of 2/6 he had new onset of A-fib and transferred to [facility].     Assessment / Plan / Recommendation Clinical Impression  Patient is currently aphonic, and voice was not stimulable with max cues and effort from the patient.  Weak cough during this exam did produce very weak phonation, but not with any verbal attempts.      Aspiration Risk  Severe    Diet Recommendation NPO;Ice chips PRN after oral care (with caution (1-2))        Other  Recommendations Recommended Consults: FEES (when able to phonate.)   Follow Up Recommendations       Frequency and Duration min 2x/week  2 weeks   Pertinent Vitals/Pain n/a    SLP Swallow Goals Goal #3: Patient will demonstrate ability to close vocal cords with cough and with verbalizations with mod. verbal and tactile cues. Goal #4: Patient will complete objective swallow study prior to begining po diet.   Swallow Study Prior Functional Status       General HPI: 73 YOM admitted to [facility] for Cdiff on 2/2. Underwent decompressive cecostomy 2/3, initially was better, then worsened on 2/4 with increased abdominal distension and tenderness, decreased renal function.  He was taken back to the OR on 2/4 for total abdominal colectomy and end ileostomy.  Post op he developed ARDS and sepsis shock and was being treated for this.  The morning of 2/6 he had new onset of A-fib and transferred to [facility].   Type of Study: Bedside swallow evaluation Diet Prior to this Study: NPO Temperature Spikes Noted: No Respiratory Status: Supplemental O2 delivered via (comment) History of Recent Intubation: Yes Length of Intubations (days): 16 days Date extubated: 03/16/12 Behavior/Cognition: Alert (Fatigues quickly) Oral Cavity - Dentition: Adequate natural dentition;Missing dentition Self-Feeding Abilities: Total assist Patient Positioning: Upright in bed Baseline Vocal Quality:  Aphonic Volitional Cough: Weak Volitional Swallow: Unable to elicit    Oral/Motor/Sensory Function Overall Oral Motor/Sensory Function: Appears within functional limits for tasks assessed Labial ROM: Within Functional Limits Labial Symmetry: Within Functional Limits Labial Strength: Within Functional Limits Lingual ROM: Within Functional Limits Lingual Symmetry: Within Functional Limits Lingual Strength: Within Functional Limits Facial Symmetry: Within Functional Limits   Ice Chips Ice chips: Impaired Presentation: Spoon Pharyngeal Phase Impairments: Suspected delayed Swallow;Throat Clearing - Immediate;Cough - Delayed   Thin Liquid Thin Liquid: Impaired Presentation: Spoon Pharyngeal  Phase Impairments: Suspected delayed Swallow;Cough - Immediate    Nectar Thick Nectar Thick Liquid: Impaired Presentation: Spoon Pharyngeal Phase Impairments: Suspected delayed Swallow;Cough - Immediate;Cough - Delayed;Throat Clearing - Immediate;Multiple swallows   Honey Thick Honey Thick Liquid: Not tested   Puree Puree: Impaired Presentation: Spoon Pharyngeal Phase Impairments: Suspected delayed Swallow;Multiple swallows;Throat Clearing - Delayed   Solid   GO    Solid: Not tested       Connor Small 03/16/2012,11:44 AM

## 2012-03-17 DIAGNOSIS — I6350 Cerebral infarction due to unspecified occlusion or stenosis of unspecified cerebral artery: Secondary | ICD-10-CM

## 2012-03-17 DIAGNOSIS — G8250 Quadriplegia, unspecified: Secondary | ICD-10-CM | POA: Insufficient documentation

## 2012-03-17 LAB — CBC WITH DIFFERENTIAL/PLATELET
Basophils Absolute: 0 10*3/uL (ref 0.0–0.1)
Eosinophils Relative: 0 % (ref 0–5)
Lymphocytes Relative: 5 % — ABNORMAL LOW (ref 12–46)
Lymphs Abs: 0.8 10*3/uL (ref 0.7–4.0)
MCH: 32.3 pg (ref 26.0–34.0)
Neutrophils Relative %: 91 % — ABNORMAL HIGH (ref 43–77)
Platelets: 30 10*3/uL — ABNORMAL LOW (ref 150–400)
RBC: 2.82 MIL/uL — ABNORMAL LOW (ref 4.22–5.81)
WBC: 15.2 10*3/uL — ABNORMAL HIGH (ref 4.0–10.5)

## 2012-03-17 LAB — GLUCOSE, CAPILLARY
Glucose-Capillary: 162 mg/dL — ABNORMAL HIGH (ref 70–99)
Glucose-Capillary: 167 mg/dL — ABNORMAL HIGH (ref 70–99)
Glucose-Capillary: 169 mg/dL — ABNORMAL HIGH (ref 70–99)
Glucose-Capillary: 193 mg/dL — ABNORMAL HIGH (ref 70–99)

## 2012-03-17 LAB — RENAL FUNCTION PANEL
Albumin: 2.5 g/dL — ABNORMAL LOW (ref 3.5–5.2)
Calcium: 7.8 mg/dL — ABNORMAL LOW (ref 8.4–10.5)
GFR calc Af Amer: 30 mL/min — ABNORMAL LOW (ref >90)
GFR calc non Af Amer: 26 mL/min — ABNORMAL LOW (ref >90)
Phosphorus: 6.7 mg/dL — ABNORMAL HIGH (ref 2.3–4.6)
Sodium: 144 mEq/L (ref 135–145)

## 2012-03-17 LAB — PROTIME-INR: Prothrombin Time: 35.1 seconds — ABNORMAL HIGH (ref 11.6–15.2)

## 2012-03-17 LAB — APTT: aPTT: 63 seconds — ABNORMAL HIGH (ref 24–37)

## 2012-03-17 MED ORDER — FREE WATER
100.0000 mL | Freq: Three times a day (TID) | Status: DC
  Administered 2012-03-17 – 2012-03-18 (×3): 100 mL

## 2012-03-17 MED ORDER — CHLORHEXIDINE GLUCONATE 0.12 % MT SOLN
15.0000 mL | Freq: Two times a day (BID) | OROMUCOSAL | Status: DC
  Administered 2012-03-17 – 2012-03-27 (×22): 15 mL via OROMUCOSAL
  Filled 2012-03-17 (×25): qty 15

## 2012-03-17 MED ORDER — BIOTENE DRY MOUTH MT LIQD
15.0000 mL | Freq: Two times a day (BID) | OROMUCOSAL | Status: DC
  Administered 2012-03-17 – 2012-03-27 (×22): 15 mL via OROMUCOSAL

## 2012-03-17 MED ORDER — HYDROCORTISONE SOD SUCCINATE 100 MG IJ SOLR
25.0000 mg | Freq: Four times a day (QID) | INTRAMUSCULAR | Status: DC
  Administered 2012-03-17 – 2012-03-19 (×8): 25 mg via INTRAVENOUS
  Filled 2012-03-17 (×8): qty 0.5

## 2012-03-17 MED ORDER — VANCOMYCIN HCL 10 G IV SOLR
1500.0000 mg | INTRAVENOUS | Status: DC
  Administered 2012-03-18: 1500 mg via INTRAVENOUS
  Filled 2012-03-17: qty 1500

## 2012-03-17 NOTE — Progress Notes (Signed)
18 Days Post-Op  Subjective: Tolerating TF with minimal residuals. About 1300cc ostomy output - liquid. Needs FEES when able to phonate better.   Objective: Vital signs in last 24 hours: Temp:  [97.5 F (36.4 C)-97.8 F (36.6 C)] 97.8 F (36.6 C) (02/22 0352) Pulse Rate:  [84-102] 90 (02/22 0700) Resp:  [10-28] 17 (02/22 0700) BP: (79-107)/(40-56) 103/56 mmHg (02/22 0700) SpO2:  [99 %-100 %] 100 % (02/22 0700) Arterial Line BP: (88-138)/(44-69) 107/52 mmHg (02/22 0700) Weight:  [182 lb 15.7 oz (83 kg)] 182 lb 15.7 oz (83 kg) (02/22 0500) Last BM Date: 03/16/12 (ostomy)  Intake/Output from previous day: 02/21 0701 - 02/22 0700 In: 2615.1 [I.V.:435.1; NG/GT:980; IV Piggyback:1200] Out: 1610 [Urine:1415; Drains:2210; Stool:1300] Intake/Output this shift:    Alert, FC. Minimal verbalization Abd soft, mild distension. Air/stool in ostomy bag. Midline incision open - has some mild beginning fat necrosis toward inf aspect. No cellulitis. Drains -serosang  Lab Results:   Recent Labs  03/16/12 0450 03/17/12 0420  WBC 20.1* 15.2*  HGB 10.6* 9.1*  HCT 32.5* 28.3*  PLT 32* 30*   BMET  Recent Labs  03/16/12 1811 03/17/12 0420  NA 141 144  K 3.7 3.5  CL 104 108  CO2 23 20  GLUCOSE 99 165*  BUN 130* 135*  CREATININE 2.57* 2.63*  CALCIUM 7.7* 7.8*   PT/INR  Recent Labs  03/16/12 0450 03/17/12 0420  LABPROT 41.3* 35.1*  INR 4.71* 3.78*   ABG  Recent Labs  03/15/12 0458 03/15/12 1100  PHART 7.440 7.475*  HCO3 25.2* 23.8    Studies/Results: No results found.  Anti-infectives: Anti-infectives   Start     Dose/Rate Route Frequency Ordered Stop   03/14/12 1000  micafungin (MYCAMINE) 100 mg in sodium chloride 0.9 % 100 mL IVPB     100 mg 100 mL/hr over 1 Hours Intravenous Daily 03/13/12 0632     03/13/12 1000  micafungin (MYCAMINE) 100 mg in sodium chloride 0.9 % 100 mL IVPB     100 mg 100 mL/hr over 1 Hours Intravenous Daily 03/13/12 0632 03/13/12 1311   03/12/12 1800  vancomycin (VANCOCIN) IVPB 1000 mg/200 mL premix     1,000 mg 200 mL/hr over 60 Minutes Intravenous Every 24 hours 03/12/12 0859     03/12/12 1000  imipenem-cilastatin (PRIMAXIN) 500 mg in sodium chloride 0.9 % 100 mL IVPB  Status:  Discontinued     500 mg 200 mL/hr over 30 Minutes Intravenous 3 times per day 03/12/12 0848 03/12/12 0909   03/12/12 1000  imipenem-cilastatin (PRIMAXIN) 250 mg in sodium chloride 0.9 % 100 mL IVPB     250 mg 200 mL/hr over 30 Minutes Intravenous 4 times per day 03/12/12 0909     03/11/12 1200  vancomycin (VANCOCIN) 750 mg in sodium chloride 0.9 % 150 mL IVPB  Status:  Discontinued     750 mg 150 mL/hr over 60 Minutes Intravenous Every 12 hours 03/11/12 0204 03/12/12 0858   03/11/12 0215  vancomycin (VANCOCIN) 750 mg in sodium chloride 0.9 % 150 mL IVPB     750 mg 150 mL/hr over 60 Minutes Intravenous  Once 03/11/12 0204 03/11/12 0352   03/11/12 0215  aztreonam (AZACTAM) 1 g in dextrose 5 % 50 mL IVPB  Status:  Discontinued     1 g 100 mL/hr over 30 Minutes Intravenous 3 times per day 03/11/12 0204 03/12/12 0851   03/05/12 2200  metroNIDAZOLE (FLAGYL) IVPB 500 mg    Comments:  First dose  ASAP   500 mg 100 mL/hr over 60 Minutes Intravenous Every 8 hours 03/05/12 1346     03/02/12 1200  vancomycin (VANCOCIN) 50 mg/mL oral solution 500 mg     500 mg Oral 4 times per day 03/02/12 0913     03/01/12 1445  vancomycin (VANCOCIN) 50 mg/mL oral solution 500 mg  Status:  Discontinued     500 mg Oral 3 times per day 03/01/12 1340 03/02/12 0913   03/01/12 1400  aztreonam (AZACTAM) 1 g in dextrose 5 % 50 mL IVPB  Status:  Discontinued     1 g 100 mL/hr over 30 Minutes Intravenous 3 times per day 03/01/12 1356 03/02/12 1146   03/01/12 1300  vancomycin (VANCOCIN) 1,250 mg in sodium chloride 0.9 % 250 mL IVPB  Status:  Discontinued     1,250 mg 166.7 mL/hr over 90 Minutes Intravenous Every 12 hours 03/01/12 1127 03/02/12 1146   02/29/12 0600  ciprofloxacin  (CIPRO) IVPB 400 mg     400 mg 200 mL/hr over 60 Minutes Intravenous On call to O.R. 02/28/12 1409 02/28/12 1438   02/28/12 2200  vancomycin (VANCOCIN) 500 mg in sodium chloride irrigation 0.9 % 60 mL ENEMA  Status:  Discontinued     500 mg Rectal 2 times daily 02/28/12 1504 02/28/12 1817   02/28/12 1200  vancomycin (VANCOCIN) 50 mg/mL oral solution 500 mg  Status:  Discontinued    Comments:  First dose ASAP   500 mg Per Tube 4 times per day 02/28/12 0814 02/28/12 1817   02/28/12 1000  vancomycin (VANCOCIN) IVPB 1000 mg/200 mL premix  Status:  Discontinued     1,000 mg 200 mL/hr over 60 Minutes Intravenous Every 12 hours 02/28/12 0845 03/01/12 1127   02/28/12 1000  vancomycin (VANCOCIN) 500 mg in sodium chloride irrigation 0.9 % 100 mL ENEMA  Status:  Discontinued     500 mg Rectal 2 times daily 02/28/12 0923 02/28/12 1504   02/27/12 1600  metroNIDAZOLE (FLAGYL) IVPB 500 mg  Status:  Discontinued     500 mg 100 mL/hr over 60 Minutes Intravenous Every 8 hours 02/27/12 1546 02/27/12 2000   02/27/12 1600  ciprofloxacin (CIPRO) IVPB 400 mg  Status:  Discontinued     400 mg 200 mL/hr over 60 Minutes Intravenous Every 12 hours 02/27/12 1547 02/27/12 2000   02/27/12 1545  metroNIDAZOLE (FLAGYL) IVPB 500 mg  Status:  Discontinued     500 mg 100 mL/hr over 60 Minutes Intravenous  Once 02/27/12 1535 02/28/12 0843   02/27/12 1545  vancomycin (VANCOCIN) 500 mg in sodium chloride 0.9 % 100 mL IVPB  Status:  Discontinued     500 mg 100 mL/hr over 60 Minutes Intravenous  Once 02/27/12 1535 02/27/12 1538   02/27/12 1545  ciprofloxacin (CIPRO) IVPB 400 mg  Status:  Discontinued     400 mg 200 mL/hr over 60 Minutes Intravenous  Once 02/27/12 1538 02/28/12 0843   02/27/12 1415  vancomycin (VANCOCIN) 500 mg in sodium chloride 0.9 % 100 mL IVPB     500 mg 100 mL/hr over 60 Minutes Intravenous  Once 02/27/12 1402 02/27/12 1411   02/27/12 1400  vancomycin (VANCOCIN) powder 500 mg  Status:  Discontinued      500 mg Other To Surgery 02/27/12 1359 02/27/12 1402   02/27/12 1200  metroNIDAZOLE (FLAGYL) IVPB 500 mg  Status:  Discontinued    Comments:  First dose ASAP   500 mg 100 mL/hr over 60 Minutes Intravenous Every  6 hours 02/27/12 0953 03/05/12 1346   02/27/12 1200  vancomycin (VANCOCIN) 50 mg/mL oral solution 250 mg  Status:  Discontinued    Comments:  First dose ASAP   250 mg Oral 4 times per day 02/27/12 0953 02/28/12 0814   02/26/12 1700  oseltamivir (TAMIFLU) capsule 75 mg     75 mg Oral  Once 02/26/12 1651 02/26/12 1715      Assessment/Plan: s/p Procedure(s): TOTAL COLECTOMY (N/A) ILEOSTOMY (N/A)  Cont adv TF to goal FEES once speech improves Plan per [employer]. Connor Pulling, MD, FACS General, Bariatric, & Minimally Invasive Surgery [facility], [place]   LOS: 20 days    Connor Small 03/17/2012

## 2012-03-17 NOTE — Progress Notes (Addendum)
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 DVT  Pharmacy Consult:  Vancomycin/Primaxin  Indication:  Septic shock  Allergies  Allergen Reactions  . Penicillins Other (See Comments)    Heart rate changes    Patient Measurements: Height: 5' 9"  (175.3 cm) Weight: 182 lb 15.7 oz (83 kg) IBW/kg (Calculated) : 70.7  Vital Signs: Temp: 97.8 F (36.6 C) (02/22 0352) Temp src: Oral (02/22 0352) BP: 103/56 mmHg (02/22 0700) Pulse Rate: 90 (02/22 0700)  Labs:  Recent Labs  03/16/12 0100 03/16/12 0450 03/16/12 1811 03/17/12 0420  HGB 10.5* 10.6*  --  9.1*  HCT 31.6* 32.5*  --  28.3*  PLT 35* 32*  --  30*  APTT  --  84* 70* 63*  LABPROT  --  41.3*  --  35.1*  INR  --  4.71*  --  3.78*  CREATININE  --  2.29* 2.57* 2.63*    Estimated Creatinine Clearance: 32.5 ml/min (by C-G formula based on Cr of 2.63).  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  Patient with liver and renal dysfunction.  APTT now trending down to 63 with argatroban turned off 2/20, INR elevated to 3.7 likely residual effect of argatroban.   Argatroban remains on hold will follow along plan, no plans to resume CRRT at this time.  Septic shock/cdiff patient continues on primaxin, flagyl, po/iv vancomycin, micafungin. Patient is off CRRT and plan is to continue to hold for now, UOP improving at 1415 cc(0.7cc/kg/hr) with scr continuing to increase at 2.6. WBC down slightly but still elevated at 15. Vancomycin trough missed yesterday will recheck prior to dosing tonight. Plan is to deescalate abx Monday.  Day 20 IV metronidazole  Day 20 oral vancomycin  Day 8 IV vancomycin  Day 6 imipenem  Day 5 micafungin  2/4 Blood: negative 2/3 Blood: negative 2/4 Urine: negative 2/9 Pleural fluid: negative 2/14 UA - negative 2/15 blood - negative  Goal of Therapy:  aPTT 50-90 seconds Monitor platelets by  anticoagulation protocol: Yes   Plan:  1. Hold argatroban for now 2. PTT every 12 hrs 3. Vancomycin level prior to dose this evening 4. No change to primaxin dosing  Erin Hearing PharmD 03/17/2012 10:11 AM  Vancomycin level resulted high at 28.8, patient remains off crrt with scr slowly increasing. New calculated t 1/2 of 35 hours. Will redose vancomycin at 1523m IV q48 hours starting tomorrow morning.  Expected new trough ~16.  Very low threshold for rechecking trough if scr continues to increase off crrt.  WGeorgina Peer 03/17/2012 7:25 PM

## 2012-03-17 NOTE — Progress Notes (Signed)
Subjective: No acute changes, not on pressors today.   Exam: Filed Vitals:   03/17/12 1157  BP:   Pulse:   Temp: 97.1 F (36.2 C)  Resp:    Gen: In bed, NAD MS: Awake, able to tell me he is in a hospital in [place], but is unsure of name.  NT:JXKAJ, EOMI, VFF Motor: Right arm 0/5 proximally, able to wiggle fingers slightly, left leg able to keep left ankle out of bed when knee is supported, but unable to left left leg without help.  Sensory:endorses symmetric normal sensation.   Impression: 52 yo M with quadriparesis following prolonged intubation while receiving stress dose steroids. His weakness is concerning for a critical care myopahty with superimposed deficits from his old stroke that have resurface. Also possible would be new stroke combined with myopathy or C-Spine disease. At this time, an MRI would be most helpful to determine etiology. An EMG could be useful in differentiating myopathy from polyneuropathy, but given that both are treated supportively, I do not feel that it would significantly alter management at this point.   Recommendations: 1) MRI brain/Cspine when stable from Critical care perspective.  2) Will follow up once MRI complete.   Roland Rack, MD [facility] [phone]  If 7pm- 7am, please page neurology on call at [phone].

## 2012-03-17 NOTE — Progress Notes (Signed)
Patient ID: [name], male   DOB: 1960/08/03, 52 y.o.   MRN: [id number] S:awake/alert O:BP 103/56  Pulse 90  Temp(Src) 97.8 F (36.6 C) (Oral)  Resp 17  Ht 5\' 9"  (1.753 m)  Wt 83 kg (182 lb 15.7 oz)  BMI 27.01 kg/m2  SpO2 100%  Intake/Output Summary (Last 24 hours) at 03/17/12 1014 Last data filed at 03/17/12 0700  Gross per 24 hour  Intake 2389.43 ml  Output   4600 ml  Net -2210.57 ml   Intake/Output: I/O last 3 completed shifts: In: 3699.2 [I.V.:679.2; NG/GT:1220; IV Piggyback:1800] Out: 6775 [Urine:1840; Drains:2635; Stool:2300]  Intake/Output this shift:    Weight change: -4.1 kg (-9 lb 0.6 oz) YQM:VHQIONGE, chronically ill-appearing CVS:no rub Resp:bibasilar rales Abd:no change Ext:+anasarca   Recent Labs Lab 03/11/12 0620 03/12/12 0455  03/13/12 0445  03/14/12 0405 03/14/12 1600 03/15/12 0450 03/15/12 1600 03/16/12 0450 03/16/12 1811 03/17/12 0420  NA 133* 131*  < > 132*  < > 130* 132* 135 135 137 141 144  K 5.0 5.8*  < > 4.6  < > 4.5 4.6 4.7 4.3 4.1 3.7 3.5  CL 102 99  < > 91*  < > 97 98 99 100 103 104 108  CO2 19 17*  < > 29  < > 22 23 24 23 21 23 20   GLUCOSE 243* 143*  < > 236*  < > 224* 146* 143* 116* 125* 99 165*  BUN 130* 158*  < > 113*  < > 91* 78* 78* 91* 112* 130* 135*  CREATININE 2.35* 2.74*  < > 1.87*  < > 1.67* 1.61* 1.58* 1.97* 2.29* 2.57* 2.63*  ALBUMIN 1.3* 1.4*  < > 1.6*  < > 1.6* 2.4* 2.5* 2.5* 2.4* 2.4* 2.5*  CALCIUM 7.2* 7.0*  < > 7.0*  < > 7.0* 7.5* 7.8* 7.6* 7.8* 7.7* 7.8*  PHOS 5.6*  --   < > 6.3*  < > 6.0* 5.7* 5.1* 5.4* 5.9* 6.9* 6.7*  AST 129* 117*  --  82*  --  75*  --  62*  --  65*  --   --   ALT 55* 61*  --  62*  --  59*  --  43  --  36  --   --   < > = values in this interval not displayed. Liver Function Tests:  Recent Labs Lab 03/14/12 0405  03/15/12 0450  03/16/12 0450 03/16/12 1811 03/17/12 0420  AST 75*  --  62*  --  65*  --   --   ALT 59*  --  43  --  36  --   --   ALKPHOS 603*  --  386*  --  377*  --    --   BILITOT 13.9*  --  15.5*  --  15.4*  --   --   PROT 4.6*  --  4.6*  --  4.2*  --   --   ALBUMIN 1.6*  < > 2.5*  < > 2.4* 2.4* 2.5*  < > = values in this interval not displayed. No results found for this basename: LIPASE, AMYLASE,  in the last 168 hours No results found for this basename: AMMONIA,  in the last 168 hours CBC:  Recent Labs Lab 03/15/12 0450 03/15/12 1810 03/16/12 0100 03/16/12 0450 03/17/12 0420  WBC 27.1* 21.5* 21.2* 20.1* 15.2*  NEUTROABS 23.3*  --   --  18.3* 13.8*  HGB 12.4* 10.7* 10.5* 10.6* 9.1*  HCT 38.3*  32.6* 31.6* 32.5* 28.3*  MCV 97.5 98.2 96.6 98.5 100.4*  PLT 58* 34* 35* 32* 30*   Cardiac Enzymes: No results found for this basename: CKTOTAL, CKMB, CKMBINDEX, TROPONINI,  in the last 168 hours CBG:  Recent Labs Lab 03/16/12 1514 03/16/12 2040 03/17/12 0035 03/17/12 0351 03/17/12 0758  GLUCAP 121* 117* 162* 156* 167*    Iron Studies: No results found for this basename: IRON, TIBC, TRANSFERRIN, FERRITIN,  in the last 72 hours Studies/Results: No results found. Marland Kitchen antiseptic oral rinse  15 mL Mouth Rinse q12n4p  . chlorhexidine  15 mL Mouth Rinse BID  . famotidine  20 mg Per Tube BID  . feeding supplement  30 mL Per Tube TID  . hydrocortisone sodium succinate  25 mg Intravenous Q6H  . imipenem-cilastatin  250 mg Intravenous Q6H  . insulin aspart  0-20 Units Subcutaneous Q4H  . insulin glargine  40 Units Subcutaneous Daily  . lactulose  30 g Per Tube BID  . metronidazole  500 mg Intravenous Q8H  . micafungin (MYCAMINE) IV  100 mg Intravenous Daily  . sodium chloride  10-40 mL Intracatheter Q12H  . vancomycin  500 mg Oral Q6H  . vancomycin  1,000 mg Intravenous Q24H    BMET    Component Value Date/Time   NA 144 03/17/2012 0420   K 3.5 03/17/2012 0420   CL 108 03/17/2012 0420   CO2 20 03/17/2012 0420   GLUCOSE 165* 03/17/2012 0420   BUN 135* 03/17/2012 0420   CREATININE 2.63* 03/17/2012 0420   CALCIUM 7.8* 03/17/2012 0420   GFRNONAA  26* 03/17/2012 0420   GFRAA 30* 03/17/2012 0420   CBC    Component Value Date/Time   WBC 15.2* 03/17/2012 0420   RBC 2.82* 03/17/2012 0420   HGB 9.1* 03/17/2012 0420   HCT 28.3* 03/17/2012 0420   PLT 30* 03/17/2012 0420   MCV 100.4* 03/17/2012 0420   MCH 32.3 03/17/2012 0420   MCHC 32.2 03/17/2012 0420   RDW 23.0* 03/17/2012 0420   LYMPHSABS 0.8 03/17/2012 0420   MONOABS 0.6 03/17/2012 0420   EOSABS 0.0 03/17/2012 0420   BASOSABS 0.0 03/17/2012 0420     Assessment/Plan:  1. ARF- nonoliguric. Made 1400cc of urine however BUN and creatinine cont to climb.  Will hold off on CVVHD for now per Dr. Tyson Alias, however given 3rd spacing, azotemia will be an issue.  2. VDRF- extubated  3. C diff/s/p colectomy/iliostomy- per primary svc 4. SIRS- on pressors per PCCM 5. Hyperkalemia- improved 6. Metabolic acidosis- as above.  7. Hyponatremia- due to anasarca/protein malnutrition. Resolved with CVVHD. Plan as above on hydrocortisone. Now with increased serum Na, will add free water boluses 8. Abnormal LFT's- ?shock liver with rising LDH worrisome for ischemic bowel or visceral necrosis. Surgery following. improving 9. Leukocytosis- on abx. 10. Anasarca- difficult to remove fluid but losing quite a bit through drains. Agree with albumin. 11. Dispo- cont with aggressive measures for now. 12.  Findley Vi A

## 2012-03-17 NOTE — Progress Notes (Signed)
Bilateral:  No evidence of hemodynamically significant proximal internal carotid artery stenosis.   Left vertebral artery flow is antegrade.  Technically limited study due to central lines and bandages bilaterally.

## 2012-03-17 NOTE — Progress Notes (Signed)
PULMONARY  / CRITICAL CARE MEDICINE  Name: Connor Small MRN: [id number] DOB: 1960-04-02    ADMISSION DATE:  02/26/2012 CONSULTATION DATE:  2/5  REFERRING MD :  Karilyn Cota  CHIEF COMPLAINT:  Acute resp failure   BRIEF PATIENT DESCRIPTION:  44 YOM admitted to [facility] for Cdiff on 2/2. Underwent decompressive cecostomy 2/3, initially was better, then worsened on 2/4 with increased abdominal distension and tenderness, decreased renal function.  He was taken back to the OR on 2/4 for total abdominal colectomy and end ileostomy.  Post op he developed ARDS and sepsis shock and was being treated for this.  The morning of 2/6 he had new onset of A-fib and transferred to [facility].    SIGNIFICANT EVENTS / STUDIES:  2/2 - Admit to 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 Overlying bowel gas and patientclinical status. 2. No explanation for elevated liver function tests.3. Similar to slight increase in size of a perisplenic fluidcollection.4. Right pleural effusion 2/20- reduction pressors, improved alertness 2/21- bleeding overnight from JP's, argatroban turned off, lower pressor needs 2/22- off pressors (yeah), on RA (yeah)  LINES / TUBES: ETT - OSH  2/4>>> Foley-OSH 2/3>>> PICC OSH 2/4>>>2/11 Art Line - OSH 2/5>>>out A line rt fem 2/18>>>DO not remove until coags corrected Rt ij HD 2/17>>> Left IJ 2/18>>>  CULTURES: BCx2 2/4>>>negative BCx2 2/3>>>negative UC 2/3- Negative MRSA PCR 2/3- Negative C diff 2/6>>>Positive Body fluid 2/9>>>WBCs, no organisms 2/15 BC x 2 > 2/15 Sputum >>>NF  ANTIBIOTICS: Flagyl 2/3>>> Vanc 2/6 (IV) >>>2/7 azactam 2/6>>>2/7 Oral vanc 2/6>>> IV vanc 2/16 > Aztreonam 2/16 >2/17 Imipenem 2/17>>> mycofungin 2/18>>>  SUBJECTIVE:  RA, off pressors  VITAL SIGNS: Temp:  [97 F (36.1 C)-97.8 F (36.6 C)] 97.8 F (36.6 C) (02/22 0352) Pulse Rate:  [84-102] 90 (02/22 0700) Resp:  [10-30] 17 (02/22 0700) BP: (79-107)/(40-56) 103/56 mmHg (02/22 0700) SpO2:  [99 %-100 %] 100 % (02/22 0700) Arterial Line BP: (88-138)/(44-69) 107/52 mmHg (02/22 0700) Weight:  [83 kg (182 lb 15.7 oz)] 83 kg (182 lb 15.7 oz) (02/22 0500) HEMODYNAMICS: CVP:  [1 mmHg] 1 mmHg VENTILATOR SETTINGS:   INTAKE / OUTPUT: Intake/Output     02/21 0701 - 02/22 0700 02/22 0701 - 02/23 0700   I.V. (mL/kg) 435.1 (5.2)    NG/GT 980    IV Piggyback 1200    Total Intake(mL/kg) 2615.1 (31.5)    Urine (mL/kg/hr) 1415 (0.7)    Drains 2210 (1.1)    Other     Stool 1300 (0.7)    Total Output 4925     Net -2309.9            PHYSICAL EXAMINATION: General awake, alert, nonfocal Neuro: awake, weakness rue worse than left, general debillitation, does have 3/5 lower foot  strength HEENT: lines clean Cardiovascular: s1 s2 rr Lungs: clear Abdomen: nonDistended, wound site clean and dry, hypoBS  At best, ostomy unchange, belly soft Musculoskeletal: 1+ edema about unchanged last 24 hrs Skin: follicular mild anterior rt thigh rash mild  LABS:  Recent Labs Lab 03/11/12 0620 03/12/12 0455  03/13/12 0700  03/14/12 0405 03/14/12 1123  03/15/12 0450 03/15/12 0458  03/15/12 1100  03/16/12 0100 03/16/12 0450 03/16/12 1811  03/17/12 0420  HGB 15.3 16.4  < >  --   --  16.0  --   --  12.4*  --   --   --   < > 10.5* 10.6*  --  9.1*  WBC 41.2* 39.4*  < >  --   --  29.0*  --   --  27.1*  --   --   --   < > 21.2* 20.1*  --  15.2*  PLT 289 232  < >  --   --  105*  --   --  58*  --   --   --   < > 35* 32*  --  30*  NA 133* 131*  < >  --   < > 130*  --   < > 135  --   --   --   < >  --  137 141 144  K 5.0 5.8*  < >  --   < > 4.5  --   < > 4.7  --   --   --   < >  --  4.1 3.7 3.5  CL 102 99  < >  --   < > 97  --   < > 99  --   --   --   < >  --  103 104 108  CO2 19 17*  < >  --   < > 22  --   < > 24  --   --   --   < >  --  21 23 20   GLUCOSE 243* 143*  < >  --   < > 224*  --   < > 143*  --   --   --   < >  --  125* 99 165*  BUN 130* 158*  < >  --   < > 91*  --   < > 78*  --   --   --   < >  --  112* 130* 135*  CREATININE 2.35* 2.74*  < >  --   < > 1.67*  --   < > 1.58*  --   --   --   < >  --  2.29* 2.57* 2.63*  CALCIUM 7.2* 7.0*  < >  --   < > 7.0*  --   < > 7.8*  --   --   --   < >  --  7.8* 7.7* 7.8*  MG 2.5  --   < >  --   --  2.6*  --   --  2.8*  --   --   --   --   --  3.0*  --  3.1*  PHOS 5.6*  --   < >  --   < > 6.0*  --   < > 5.1*  --   --   --   < >  --  5.9* 6.9* 6.7*  AST 129* 117*  < >  --   --  75*  --   --  62*  --   --   --   --   --  65*  --   --   ALT 55* 61*  < >  --   --  59*  --   --  43  --   --   --   --   --  36  --   --   ALKPHOS 710* 639*  < >  --   --  603*  --   --  386*  --   --   --   --   --  377*  --   --   BILITOT 3.9* 7.0*  < > 13.9*  --  13.9*  --   --  15.5*  --   --   --   --   --  15.4*  --   --   PROT 4.1* 4.4*  < >  --   --  4.6*  --   --  4.6*  --   --   --   --   --  4.2*  --   --   ALBUMIN 1.3* 1.4*  < >  --   < > 1.6*  --   < > 2.5*  --   --   --   < >  --  2.4* 2.4* 2.5*  APTT  --   --   < > 56*  --  163*  --   < >  --   --   < >  --   < >  --  84* 70* 63*  INR  --   --   < > 1.51*  --  1.55*  --   --   --   --   --   --   --   --  4.71*  --  3.78*  LATICACIDVEN  --   --   --   3.3*  --   --   --   --   --   --   --   --   --   --   --   --   --   PROCALCITON 4.22 5.70  --   --   --   --   --   --   --   --   --   --   --   --   --   --   --   PHART  --   --   --   --   < >  --  7.416  --   --  7.440  --  7.475*  --   --   --   --   --   PCO2ART  --   --   --   --   < >  --  35.0  --   --  36.8  --  32.2*  --   --   --   --   --   PO2ART  --   --   --   --   < >  --  154.0*  --   --  194.0*  --  130.0*  --   --   --   --   --   < > = values in this interval not displayed.  Recent Labs Lab 03/16/12 1201 03/16/12 1514 03/16/12 2040 03/17/12 0035 03/17/12 0351  GLUCAP 193* 121* 117* 162* 156*  Imaging: No results found. CXR 2/20 >mild hazziness rt base, ett wnl None new  ASSESSMENT / PLAN:  PULMONARY A: Acute respiratory failure in setting of septic shock and volume overload.  Lt pleural effusion.  Extubated 2/20  P:   IS On RA Encourage cough  CARDIOVASCULAR A: Septic Shock - Secondary to C-diff colitis; off pressors 2/08. Restarted 2/13 (relatd to volume?) Atrial Fibrillation with RVR.> Low [place] score. High output from JP's remains 2/22 Clot around picc, dvt rue 2/11 Shock reoccurrence, related to sepsis? Volume? 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 Hx of ulcerative colitis. cholestatic picture conjugating, related to sepsis, voluem status?  P:   Tf tolerated still, continue at goal pepcid Trend bili, awaited slp needed today  HEMATOLOGIC A: Leukocytosis dvt picc aassociated rue  2/11 Small splenic hematoma? Thrombocytopenia r/o HITT 2/19 Drop hgb P:  Follow HITT assay- should be back by tues Continue to hold argatroban with bloody drainage prior and lFt contrubtion Will repeat coags in am  Will hold off dc a line with risk retroper bleeding wwith residual argatroban? No role plat tx Dopplers legs - neg  INFECTIOUS A: C diff colitis, clinically progressing P:   Continue enteral vancomycin and IV flagyl  Vanc IV imipenem Appreciate ID, can we narrow the empiric abdo coverage started monday  ENDOCRINE A: Hyperglycemia. controlled Relative adrenal insufficiency. P:   SSI Lantus to continue with TF Solucortef to 25 q6h x 3 days then taper off over 1 week totoal  NEUROLOGIC A:  Acute encephalopathy R/o stroke vs crit illness myopathy / neuroapthy Will need MRI brain, still some weakness rue, at risk embolic cva P:   Aggressive pt/ ot, splints Order MRi for Monday likely pending status Neuro eval appreciated Carotids pending Role emg?  Keep in ICU   Mcarthur Rossetti. Tyson Alias, MD, FACP Pgr: [phone] [facility]

## 2012-03-18 DIAGNOSIS — T8131XA Disruption of external operation (surgical) wound, not elsewhere classified, initial encounter: Secondary | ICD-10-CM

## 2012-03-18 LAB — GLUCOSE, CAPILLARY: Glucose-Capillary: 177 mg/dL — ABNORMAL HIGH (ref 70–99)

## 2012-03-18 LAB — BASIC METABOLIC PANEL
Chloride: 121 mEq/L — ABNORMAL HIGH (ref 96–112)
GFR calc Af Amer: 32 mL/min — ABNORMAL LOW (ref >90)
Potassium: 3.2 mEq/L — ABNORMAL LOW (ref 3.5–5.1)

## 2012-03-18 LAB — RENAL FUNCTION PANEL
BUN: 150 mg/dL — ABNORMAL HIGH (ref 6–23)
CO2: 22 mEq/L (ref 19–32)
Calcium: 7.6 mg/dL — ABNORMAL LOW (ref 8.4–10.5)
Creatinine, Ser: 2.61 mg/dL — ABNORMAL HIGH (ref 0.50–1.35)
Glucose, Bld: 181 mg/dL — ABNORMAL HIGH (ref 70–99)

## 2012-03-18 LAB — CBC WITH DIFFERENTIAL/PLATELET
Basophils Absolute: 0 10*3/uL (ref 0.0–0.1)
Lymphs Abs: 0.4 10*3/uL — ABNORMAL LOW (ref 0.7–4.0)
MCV: 101.9 fL — ABNORMAL HIGH (ref 78.0–100.0)
Monocytes Absolute: 0.7 10*3/uL (ref 0.1–1.0)
Monocytes Relative: 6 % (ref 3–12)
Platelets: 35 10*3/uL — ABNORMAL LOW (ref 150–400)
RDW: 23.1 % — ABNORMAL HIGH (ref 11.5–15.5)
WBC: 12 10*3/uL — ABNORMAL HIGH (ref 4.0–10.5)

## 2012-03-18 LAB — CREATININE, FLUID (PLEURAL, PERITONEAL, JP DRAINAGE): Creat, Fluid: 2.9 mg/dL

## 2012-03-18 LAB — PROTIME-INR: INR: 2.23 — ABNORMAL HIGH (ref 0.00–1.49)

## 2012-03-18 LAB — AMYLASE, BODY FLUID: Amylase, Fluid: 33 U/L

## 2012-03-18 LAB — AMMONIA: Ammonia: 17 umol/L (ref 11–60)

## 2012-03-18 MED ORDER — POTASSIUM CHLORIDE 10 MEQ/50ML IV SOLN
10.0000 meq | INTRAVENOUS | Status: AC
  Administered 2012-03-18 – 2012-03-19 (×4): 10 meq via INTRAVENOUS
  Filled 2012-03-18: qty 200

## 2012-03-18 MED ORDER — DEXTROSE 5 % IV SOLN
INTRAVENOUS | Status: DC
  Administered 2012-03-18 – 2012-03-19 (×3): via INTRAVENOUS

## 2012-03-18 MED ORDER — ALBUMIN HUMAN 5 % IV SOLN
12.5000 g | Freq: Two times a day (BID) | INTRAVENOUS | Status: DC
  Administered 2012-03-18 (×2): 12.5 g via INTRAVENOUS
  Filled 2012-03-18 (×4): qty 250

## 2012-03-18 MED ORDER — POTASSIUM CHLORIDE 10 MEQ/50ML IV SOLN
10.0000 meq | INTRAVENOUS | Status: AC
  Administered 2012-03-18 (×5): 10 meq via INTRAVENOUS
  Filled 2012-03-18: qty 250

## 2012-03-18 MED ORDER — POTASSIUM CHLORIDE 20 MEQ/15ML (10%) PO LIQD
40.0000 meq | Freq: Once | ORAL | Status: AC
  Administered 2012-03-18: 40 meq
  Filled 2012-03-18: qty 30

## 2012-03-18 MED ORDER — FREE WATER
200.0000 mL | Freq: Four times a day (QID) | Status: DC
  Administered 2012-03-18 – 2012-03-22 (×15): 200 mL

## 2012-03-18 NOTE — Progress Notes (Signed)
Patient ID: Joanna Hews, male   DOB: [birth year], 52 y.o.   MRN: [id number] Called to see patient for drainage from midline wound. His abdominal drains stopped putting any volume out and he had same drainage from midline wound.  I examined him and drains are both clotted.  I unclotted these and we will replace with jp bulbs.  The midline wound has dehisced.  There is 2x2 cm opening into peritoneum with bowel visible at base.  I think option certainly is to return to operating room right now and place retention sutures reclose this. I think we could also possibly manage with vac and let this heal with resultant hernia (He will have one even if we go back).  I think going back to or will certainly set his recovery back and he might not have to if this will granulate in.  Will just leave dressing on it tonight so we can monitor and if no worse in am will place vac with white sponge.  If it worsens overnight then we will go to or.  I discussed this with his wife.

## 2012-03-18 NOTE — Progress Notes (Addendum)
PULMONARY  / CRITICAL CARE MEDICINE  Name: Connor Small MRN: [id number] DOB: 11-26-1960    ADMISSION DATE:  02/26/2012 CONSULTATION DATE:  2/5  REFERRING MD :  Karilyn Cota  CHIEF COMPLAINT:  Acute resp failure   BRIEF PATIENT DESCRIPTION:  46 YOM admitted to [facility] for Cdiff on 2/2. Underwent decompressive cecostomy 2/3, initially was better, then worsened on 2/4 with increased abdominal distension and tenderness, decreased renal function.  He was taken back to the OR on 2/4 for total abdominal colectomy and end ileostomy.  Post op he developed ARDS and sepsis shock and was being treated for this.  The morning of 2/6 he had new onset of A-fib and transferred to [facility].    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 Overlying bowel gas and patientclinical status. 2. No explanation for elevated liver function tests.3. Similar to slight increase in size of a perisplenic fluidcollection.4. Right pleural effusion 2/20- reduction pressors, improved alertness 2/21- bleeding overnight from JP's, argatroban turned off, lower pressor needs 2/22- off pressors (yeah), on RA (yeah) 2/23- neg from JPs again, inr  reducing  LINES / TUBES: ETT - OSH 2/4>>> Foley-OSH 2/3>>> PICC OSH 2/4>>>2/11 Art Line - OSH 2/5>>>out A line rt fem 2/18>>>DO not remove until coags corrected Rt ij HD 2/17>>> Left IJ 2/18>>>  CULTURES: BCx2 2/4>>>negative BCx2 2/3>>>negative UC 2/3- Negative MRSA PCR 2/3- Negative C diff 2/6>>>Positive Body fluid 2/9>>>WBCs, no organisms 2/15 BC x 2 > 2/15 Sputum >>>NF  ANTIBIOTICS: Flagyl 2/3>>> Vanc 2/6 (IV) >>>2/7 azactam 2/6>>>2/7 Oral vanc 2/6>>> IV vanc 2/16 > Aztreonam 2/16 >2/17 Imipenem 2/17>>> mycofungin 2/18>>>  SUBJECTIVE:  RA, off pressors  VITAL SIGNS: Temp:  [93.5 F (34.2 C)-98.6 F (37 C)] 97 F (36.1 C) (02/23 0443) Pulse Rate:  [85-108] 103 (02/23 0600) Resp:  [9-28] 18 (02/23 0600) BP: (85-103)/(46-63) 88/51 mmHg (02/23 0600) SpO2:  [98 %-100 %] 100 % (02/23 0600) Arterial Line BP: (89-120)/(46-66) 90/51 mmHg (02/23 0600) Weight:  [84.5 kg (186 lb 4.6 oz)] 84.5 kg (186 lb 4.6 oz) (02/23 0459) HEMODYNAMICS: CVP:  [2 mmHg-3 mmHg] 3 mmHg VENTILATOR SETTINGS:   INTAKE / OUTPUT: Intake/Output     02/22 0701 - 02/23 0700 02/23 0701 - 02/24 0700   I.V. (mL/kg) 400 (4.7)    NG/GT 1390    IV Piggyback 950    Total Intake(mL/kg) 2740 (32.4)    Urine (mL/kg/hr) 1495 (0.7)    Drains 1925 (0.9)    Stool 1275 (0.6)    Total Output 4695     Net -1955            PHYSICAL EXAMINATION: General awake, alert, weak voice Neuro: awake, weakness rue worse than left, general debillitation, does  have 3/5 lower foot strength HEENT: lines clean neck Cardiovascular: s1 s2 rrr Lungs: clear, stronger cough Abdomen: nonDistended, wound site clean and dry, reduced BS  At best, ostomy ok, belly remains soft Musculoskeletal: 1+ edema about unchanged last 24 hrs Skin: follicular mild anterior rt thigh rash mild, not impressive  LABS:  Recent Labs Lab 03/12/12 0455  03/13/12 0700  03/14/12 0405 03/14/12 1123  03/15/12 0450 03/15/12 0458   03/15/12 1100  03/16/12 0450 03/16/12 1811 03/17/12 0420 03/17/12 1115 03/18/12 0503  HGB 16.4  < >  --   --  16.0  --   --  12.4*  --   --   --   < > 10.6*  --  9.1*  --  10.2*  WBC 39.4*  < >  --   --  29.0*  --   --  27.1*  --   --   --   < > 20.1*  --  15.2*  --  12.0*  PLT 232  < >  --   --  105*  --   --  58*  --   --   --   < > 32*  --  30*  --  35*  NA 131*  < >  --   < > 130*  --   < > 135  --   --   --   < > 137 141 144  --  150*  K 5.8*  < >  --   < > 4.5  --   < > 4.7  --   --   --   < > 4.1 3.7 3.5  --  2.9*  CL 99  < >  --   < > 97  --   < > 99  --   --   --   < > 103 104 108  --  114*  CO2 17*  < >  --   < > 22  --   < > 24  --   --   --   < > 21 23 20   --  22  GLUCOSE 143*  < >  --   < > 224*  --   < > 143*  --   --   --   < > 125* 99 165*  --  181*  BUN 158*  < >  --   < > 91*  --   < > 78*  --   --   --   < > 112* 130* 135*  --  150*  CREATININE 2.74*  < >  --   < > 1.67*  --   < > 1.58*  --   --   --   < > 2.29* 2.57* 2.63*  --  2.61*  CALCIUM 7.0*  < >  --   < > 7.0*  --   < > 7.8*  --   --   --   < > 7.8* 7.7* 7.8*  --  7.6*  MG  --   < >  --   --  2.6*  --   --  2.8*  --   --   --   --  3.0*  --  3.1*  --   --   PHOS  --   < >  --   < > 6.0*  --   < > 5.1*  --   --   --   < >  5.9* 6.9* 6.7*  --  5.8*  AST 117*  < >  --   --  75*  --   --  62*  --   --   --   --  65*  --   --   --   --   ALT 61*  < >  --   --  59*  --   --  43  --   --   --   --  36  --   --   --   --   ALKPHOS 639*  < >  --   --  603*  --   --  386*  --   --   --   --  377*  --   --   --   --   BILITOT 7.0*  < > 13.9*  --  13.9*  --   --  15.5*  --   --   --   --  15.4*  --   --  14.1*  --   PROT 4.4*  < >  --   --  4.6*  --   --  4.6*  --   --   --   --  4.2*  --   --   --   --   ALBUMIN 1.4*  < >  --   < > 1.6*  --   < > 2.5*  --   --   --   < > 2.4* 2.4* 2.5*  --  2.2*  APTT  --   < > 56*  --  163*  --   < >  --   --   < >  --   < > 84* 70* 63*  --  43*  INR  --   < > 1.51*  --  1.55*  --   --   --    --   --   --   --  4.71*  --  3.78*  --  2.23*  LATICACIDVEN  --   --  3.3*  --   --   --   --   --   --   --   --   --   --   --   --   --   --   PROCALCITON 5.70  --   --   --   --   --   --   --   --   --   --   --   --   --   --   --   --   PHART  --   --   --   < >  --  7.416  --   --  7.440  --  7.475*  --   --   --   --   --   --   PCO2ART  --   --   --   < >  --  35.0  --   --  36.8  --  32.2*  --   --   --   --   --   --   PO2ART  --   --   --   < >  --  154.0*  --   --  194.0*  --  130.0*  --   --   --   --   --   --   < > = values  in this interval not displayed.  Recent Labs Lab 03/17/12 1142 03/17/12 1556 03/17/12 2003 03/18/12 0021 03/18/12 0433  GLUCAP 193* 169* 137* 226* 140*    Imaging: No results found. CXR 2/20 >mild hazziness rt base, ett wnl None new  ASSESSMENT / PLAN:  PULMONARY A: Acute respiratory failure in setting of septic shock and volume overload.  Lt pleural effusion.  Extubated 2/20  P:   IS Encourage cough Keep on sats while moving  CARDIOVASCULAR A: Septic Shock - Secondary to C-diff colitis; off pressors 2/08. Restarted 2/13 (relatd to volume?) Atrial Fibrillation with RVR.> Low [place] score. High output from JP's remains 2/22 - high risk hypotension from volume depletion from ATN output and JP's Clot around picc, dvt rue 2/11 Shock reoccurrence, related to sepsis? Volume? 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  Increase free water, see renal Reduce steroids to off over 1 week  RENAL A:  Acute Renal Failure Hypernatremia, free water deficit from output hypokalemia P:   Add d5w at 75, chem in pm ann am k supp  GASTROINTESTINAL A: Toxic megacolon 2nd to C difficile colitis s/p colectomy with end ileostomy 2/5. Hx of ulcerative colitis. cholestatic picture conjugating, related to sepsis, voluem status - improved 2/23  P:   Tf tolerated still,  continue at goal pepcid Trend bili, awaited slp needed today- will need assessment today, WILL CALL THEM  HEMATOLOGIC A: Leukocytosis dvt picc aassociated rue 2/11 Small splenic hematoma? Thrombocytopenia r/o HITT 2/19, increased 2/23 Dopplers legs - neg lowers P:  Follow HITT assay- should be back by tues Continue to hold argatroban with bloody drainage prior and lFt contrubtion Will repeat coags daily, improved 2/23 Will hold off dc a line with risk retroper bleeding wwith residual argatroban?, hope to dc in am 2/24 No role plat tx   INFECTIOUS A: C diff colitis, clinically progressing P:   Continue enteral vancomycin and IV flagyl  Vanc IV imipenem ID, can we narrow the empiric abdo coverage started Monday?  ENDOCRINE A: Hyperglycemia. Controlled NICE wnl Relative adrenal insufficiency. P:   SSI Lantus to continue with TF Solucortef to 25 q6h x 3 days then taper off over 1 week totoal, reduce in am  If drops BP, re increase  NEUROLOGIC A:  Acute encephalopathy R/o stroke vs crit illness myopathy / neuroapthy Will need MRI brain and neck, wait till safe for this P:   Aggressive pt/ ot, splints Order MRi for Monday Carotids pending  Keep in ICU  Appreciate surgery input: will send Jp for crt, r/o urethral injury, also sending amylase   Mcarthur Rossetti. Tyson Alias, MD, FACP Pgr: [phone] [facility]

## 2012-03-18 NOTE — Progress Notes (Signed)
Midline dressing changed, moderate amount of serous drainage noted. Slight protrusion of intestines, Dr. Donne Hazel called and made aware, no new orders received, will continue to closely monitor.

## 2012-03-18 NOTE — Progress Notes (Signed)
Hypokalemia   K replaced  

## 2012-03-18 NOTE — Progress Notes (Signed)
Dr. Donne Hazel notified of drainage from midline abdominal wound. To bedside to assess. New orders received.

## 2012-03-18 NOTE — Progress Notes (Signed)
Patient ID: Connor Small, male   DOB: 1961/01/09, 52 y.o.   MRN: [id number] S:awake/alert, no complaints O:BP 91/52  Pulse 106  Temp(Src) 97 F (36.1 C) (Oral)  Resp 19  Ht 5\' 9"  (1.753 m)  Wt 84.5 kg (186 lb 4.6 oz)  BMI 27.5 kg/m2  SpO2 100%  Intake/Output Summary (Last 24 hours) at 03/18/12 0910 Last data filed at 03/18/12 0645  Gross per 24 hour  Intake   2600 ml  Output   4020 ml  Net  -1420 ml   Intake/Output: I/O last 3 completed shifts: In: 4090 [I.V.:620; NG/GT:2020; IV Piggyback:1450] Out: 7685 [Urine:2410; Drains:3375; Stool:1900]  Intake/Output this shift:    Weight change: 1.5 kg (3 lb 4.9 oz) NFA:OZHYQMVHQ and ill-appearing ION:GEXBM Resp:decreased BS at bases Abd:no change WUX:LKGMWNUU anasarca 2+ upper ext, trace pretib edema   Recent Labs Lab 03/12/12 0455  03/13/12 0445  03/14/12 0405 03/14/12 1600 03/15/12 0450 03/15/12 1600 03/16/12 0450 03/16/12 1811 03/17/12 0420 03/18/12 0503  NA 131*  < > 132*  < > 130* 132* 135 135 137 141 144 150*  K 5.8*  < > 4.6  < > 4.5 4.6 4.7 4.3 4.1 3.7 3.5 2.9*  CL 99  < > 91*  < > 97 98 99 100 103 104 108 114*  CO2 17*  < > 29  < > 22 23 24 23 21 23 20 22   GLUCOSE 143*  < > 236*  < > 224* 146* 143* 116* 125* 99 165* 181*  BUN 158*  < > 113*  < > 91* 78* 78* 91* 112* 130* 135* 150*  CREATININE 2.74*  < > 1.87*  < > 1.67* 1.61* 1.58* 1.97* 2.29* 2.57* 2.63* 2.61*  ALBUMIN 1.4*  < > 1.6*  < > 1.6* 2.4* 2.5* 2.5* 2.4* 2.4* 2.5* 2.2*  CALCIUM 7.0*  < > 7.0*  < > 7.0* 7.5* 7.8* 7.6* 7.8* 7.7* 7.8* 7.6*  PHOS  --   < > 6.3*  < > 6.0* 5.7* 5.1* 5.4* 5.9* 6.9* 6.7* 5.8*  AST 117*  --  82*  --  75*  --  62*  --  65*  --   --   --   ALT 61*  --  62*  --  59*  --  43  --  36  --   --   --   < > = values in this interval not displayed. Liver Function Tests:  Recent Labs Lab 03/14/12 0405  03/15/12 0450  03/16/12 0450 03/16/12 1811 03/17/12 0420 03/17/12 1115 03/18/12 0503  AST 75*  --  62*  --  65*  --   --   --    --   ALT 59*  --  43  --  36  --   --   --   --   ALKPHOS 603*  --  386*  --  377*  --   --   --   --   BILITOT 13.9*  --  15.5*  --  15.4*  --   --  14.1*  --   PROT 4.6*  --  4.6*  --  4.2*  --   --   --   --   ALBUMIN 1.6*  < > 2.5*  < > 2.4* 2.4* 2.5*  --  2.2*  < > = values in this interval not displayed. No results found for this basename: LIPASE, AMYLASE,  in the last 168 hours  Recent Labs  Lab 03/18/12 0630  AMMONIA 17   CBC:  Recent Labs Lab 03/15/12 1810 03/16/12 0100 03/16/12 0450 03/17/12 0420 03/18/12 0503  WBC 21.5* 21.2* 20.1* 15.2* 12.0*  NEUTROABS  --   --  18.3* 13.8* 10.9*  HGB 10.7* 10.5* 10.6* 9.1* 10.2*  HCT 32.6* 31.6* 32.5* 28.3* 31.9*  MCV 98.2 96.6 98.5 100.4* 101.9*  PLT 34* 35* 32* 30* 35*   Cardiac Enzymes: No results found for this basename: CKTOTAL, CKMB, CKMBINDEX, TROPONINI,  in the last 168 hours CBG:  Recent Labs Lab 03/17/12 1556 03/17/12 2003 03/18/12 0021 03/18/12 0433 03/18/12 0800  GLUCAP 169* 137* 226* 140* 177*    Iron Studies: No results found for this basename: IRON, TIBC, TRANSFERRIN, FERRITIN,  in the last 72 hours Studies/Results: No results found. Marland Kitchen albumin human  12.5 g Intravenous Q12H  . antiseptic oral rinse  15 mL Mouth Rinse q12n4p  . chlorhexidine  15 mL Mouth Rinse BID  . famotidine  20 mg Per Tube BID  . feeding supplement  30 mL Per Tube TID  . free water  100 mL Per Tube Q8H  . hydrocortisone sodium succinate  25 mg Intravenous Q6H  . imipenem-cilastatin  250 mg Intravenous Q6H  . insulin aspart  0-20 Units Subcutaneous Q4H  . insulin glargine  40 Units Subcutaneous Daily  . lactulose  30 g Per Tube BID  . metronidazole  500 mg Intravenous Q8H  . micafungin (MYCAMINE) IV  100 mg Intravenous Daily  . potassium chloride  10 mEq Intravenous Q1 Hr x 5  . sodium chloride  10-40 mL Intracatheter Q12H  . vancomycin  1,500 mg Intravenous Q48H  . vancomycin  500 mg Oral Q6H    BMET    Component  Value Date/Time   NA 150* 03/18/2012 0503   K 2.9* 03/18/2012 0503   CL 114* 03/18/2012 0503   CO2 22 03/18/2012 0503   GLUCOSE 181* 03/18/2012 0503   BUN 150* 03/18/2012 0503   CREATININE 2.61* 03/18/2012 0503   CALCIUM 7.6* 03/18/2012 0503   GFRNONAA 26* 03/18/2012 0503   GFRAA 31* 03/18/2012 0503   CBC    Component Value Date/Time   WBC 12.0* 03/18/2012 0503   RBC 3.13* 03/18/2012 0503   HGB 10.2* 03/18/2012 0503   HCT 31.9* 03/18/2012 0503   PLT 35* 03/18/2012 0503   MCV 101.9* 03/18/2012 0503   MCH 32.6 03/18/2012 0503   MCHC 32.0 03/18/2012 0503   RDW 23.1* 03/18/2012 0503   LYMPHSABS 0.4* 03/18/2012 0503   MONOABS 0.7 03/18/2012 0503   EOSABS 0.0 03/18/2012 0503   BASOSABS 0.0 03/18/2012 0503     Assessment/Plan:  1. ARF- nonoliguric. Made 1495cc of urine over last 24 hrs with additional output from drains.  Remains intravascular volume deplete which explains worsening azotemia.  Will hold off on CVVHD for now per Dr. Tyson Alias, however given 3rd spacing, azotemia will be an issue.  Increase IVF's to match output and agree with surgery to further evaluate possible right ureteral injury contributing to the increased JP output.  2. VDRF- extubated  3. Hypernatremia- free water deficit is 3.6L.  Agree with D5 but will increase rate.  Cont with free water boluses as well. Tapering hydrocortisone 4. Toxic megacolon due to C diff/s/p colectomy/iliostomy- per primary svc.  Agree with checking creatinine levels from JP drain as there is concern of left ureteral injury per Surgery. 5. SIRS- off pressors per PCCM 6. Hypokalemia- replete 7. Metabolic acidosis- as above.  8. Abnormal LFT's/hyperbilirubinemia- ?shock liver. Surgery following. improving 9. Leukocytosis- on abx. 10. Anasarca- difficult to remove fluid but losing quite a bit through drains. Agree with albumin. 11. Dispo- cont with aggressive measures for now. 12.  Lindsy Cerullo A

## 2012-03-18 NOTE — Progress Notes (Signed)
Speech Language Pathology Dysphagia Treatment Patient Details Name: Connor Small MRN: [id number] DOB: Nov 12, 1960 Today's Date: 03/18/2012 Time: 1230-1300 SLP Time Calculation (min): 30 min  Assessment / Plan / Recommendation Clinical Impression  F/u from BSE to reassess swallow for PO readiness.  Improvement in vocal quality in comparison with tx on 03/16/12 but still dysphonic. Patient alert but noted to have delay in processing verbal directions.  No PO trials administered as volitional cough weak and ineffective indicating decreased ability to protect airway.  Volitional swallow with marked delay with reduced laryngeal elevation as noted during palpation. Due to s/s present, overall decreased reserve and history of lengthy intubation recommend objective evaluation prior to advancing to PO's.    ST to reassess readiness to complete objective evaluation of FEEs  bedside on 03/20/11 .      Diet Recommendation  Continue with Current Diet: NPO    SLP Plan FEES;Continue with current plan of care      Swallowing Goals  SLP Swallowing Goals Swallow Study Goal #3 - Progress: Progressing toward goal Swallow Study Goal #4 - Progress: Not met  General Temperature Spikes Noted: No Respiratory Status: Room air Behavior/Cognition: Alert;Cooperative;Pleasant mood Oral Cavity - Dentition: Adequate natural dentition Patient Positioning: Upright in bed  Oral Cavity - Oral Hygiene Does patient have any of the following "at risk" factors?: Nutritional status - dependent feeder Patient is HIGH RISK - Oral Care Protocol followed (see row info): Yes Patient is AT RISK - Oral Care Protocol followed (see row info): Yes   Dysphagia Treatment Treatment focused on: Facilitation of pharyngeal phase Patient observed directly with PO's: No Reason PO's not observed: Other (comment) (dysphonic, volitional cough weak ,volitional swallow delayed) Pharyngeal Phase Signs & Symptoms: Suspected delayed swallow  initiation   [name] [place], Pace Hazard [facility] 03/18/2012, 1:58 PM

## 2012-03-18 NOTE — Progress Notes (Signed)
19 Days Post-Op  Subjective: Unchanged, hypotension this am  Objective: Vital signs in last 24 hours: Temp:  [93.5 F (34.2 C)-98.6 F (37 C)] 97 F (36.1 C) (02/23 0443) Pulse Rate:  [85-108] 103 (02/23 0600) Resp:  [9-28] 18 (02/23 0600) BP: (80-103)/(46-60) 88/51 mmHg (02/23 0600) SpO2:  [98 %-100 %] 100 % (02/23 0600) Arterial Line BP: (89-120)/(46-66) 90/51 mmHg (02/23 0600) Weight:  [186 lb 4.6 oz (84.5 kg)] 186 lb 4.6 oz (84.5 kg) (02/23 0459) Last BM Date: 03/17/12 (ostomy)  Intake/Output from previous day: 02/22 0701 - 02/23 0700 In: 2740 [I.V.:400; NG/GT:1390; IV Piggyback:950] Out: 6761 [Urine:1495; Drains:1925; PJKDT:2671] Intake/Output this shift:    Abd: soft, NT, wound with some necrosis at the central portion of the wound towards the inferior portion but intact  Both drains mostly with serous output with minimal bloody drainage. Ostomy with appropriate output   Lab Results:   Recent Labs  03/17/12 0420 03/18/12 0503  WBC 15.2* 12.0*  HGB 9.1* 10.2*  HCT 28.3* 31.9*  PLT 30* 35*   BMET  Recent Labs  03/17/12 0420 03/18/12 0503  NA 144 150*  K 3.5 2.9*  CL 108 114*  CO2 20 22  GLUCOSE 165* 181*  BUN 135* 150*  CREATININE 2.63* 2.61*  CALCIUM 7.8* 7.6*   PT/INR  Recent Labs  03/17/12 0420 03/18/12 0503  LABPROT 35.1* 23.7*  INR 3.78* 2.23*   ABG  Recent Labs  03/15/12 1100  PHART 7.475*  HCO3 23.8    Studies/Results: No results found.  Anti-infectives: Anti-infectives   Start     Dose/Rate Route Frequency Ordered Stop   03/18/12 1000  vancomycin (VANCOCIN) 1,500 mg in sodium chloride 0.9 % 500 mL IVPB     1,500 mg 250 mL/hr over 120 Minutes Intravenous Every 48 hours 03/17/12 1920     03/14/12 1000  micafungin (MYCAMINE) 100 mg in sodium chloride 0.9 % 100 mL IVPB     100 mg 100 mL/hr over 1 Hours Intravenous Daily 03/13/12 0632     03/13/12 1000  micafungin (MYCAMINE) 100 mg in sodium chloride 0.9 % 100 mL IVPB     100  mg 100 mL/hr over 1 Hours Intravenous Daily 03/13/12 0632 03/13/12 1311   03/12/12 1800  vancomycin (VANCOCIN) IVPB 1000 mg/200 mL premix  Status:  Discontinued     1,000 mg 200 mL/hr over 60 Minutes Intravenous Every 24 hours 03/12/12 0859 03/17/12 1017   03/12/12 1000  imipenem-cilastatin (PRIMAXIN) 500 mg in sodium chloride 0.9 % 100 mL IVPB  Status:  Discontinued     500 mg 200 mL/hr over 30 Minutes Intravenous 3 times per day 03/12/12 0848 03/12/12 0909   03/12/12 1000  imipenem-cilastatin (PRIMAXIN) 250 mg in sodium chloride 0.9 % 100 mL IVPB     250 mg 200 mL/hr over 30 Minutes Intravenous 4 times per day 03/12/12 0909     03/11/12 1200  vancomycin (VANCOCIN) 750 mg in sodium chloride 0.9 % 150 mL IVPB  Status:  Discontinued     750 mg 150 mL/hr over 60 Minutes Intravenous Every 12 hours 03/11/12 0204 03/12/12 0858   03/11/12 0215  vancomycin (VANCOCIN) 750 mg in sodium chloride 0.9 % 150 mL IVPB     750 mg 150 mL/hr over 60 Minutes Intravenous  Once 03/11/12 0204 03/11/12 0352   03/11/12 0215  aztreonam (AZACTAM) 1 g in dextrose 5 % 50 mL IVPB  Status:  Discontinued     1 g 100 mL/hr  over 30 Minutes Intravenous 3 times per day 03/11/12 0204 03/12/12 0851   03/05/12 2200  metroNIDAZOLE (FLAGYL) IVPB 500 mg    Comments:  First dose ASAP   500 mg 100 mL/hr over 60 Minutes Intravenous Every 8 hours 03/05/12 1346     03/02/12 1200  vancomycin (VANCOCIN) 50 mg/mL oral solution 500 mg     500 mg Oral 4 times per day 03/02/12 0913     03/01/12 1445  vancomycin (VANCOCIN) 50 mg/mL oral solution 500 mg  Status:  Discontinued     500 mg Oral 3 times per day 03/01/12 1340 03/02/12 0913   03/01/12 1400  aztreonam (AZACTAM) 1 g in dextrose 5 % 50 mL IVPB  Status:  Discontinued     1 g 100 mL/hr over 30 Minutes Intravenous 3 times per day 03/01/12 1356 03/02/12 1146   03/01/12 1300  vancomycin (VANCOCIN) 1,250 mg in sodium chloride 0.9 % 250 mL IVPB  Status:  Discontinued     1,250 mg 166.7  mL/hr over 90 Minutes Intravenous Every 12 hours 03/01/12 1127 03/02/12 1146   02/29/12 0600  ciprofloxacin (CIPRO) IVPB 400 mg     400 mg 200 mL/hr over 60 Minutes Intravenous On call to O.R. 02/28/12 1409 02/28/12 1438   02/28/12 2200  vancomycin (VANCOCIN) 500 mg in sodium chloride irrigation 0.9 % 60 mL ENEMA  Status:  Discontinued     500 mg Rectal 2 times daily 02/28/12 1504 02/28/12 1817   02/28/12 1200  vancomycin (VANCOCIN) 50 mg/mL oral solution 500 mg  Status:  Discontinued    Comments:  First dose ASAP   500 mg Per Tube 4 times per day 02/28/12 0814 02/28/12 1817   02/28/12 1000  vancomycin (VANCOCIN) IVPB 1000 mg/200 mL premix  Status:  Discontinued     1,000 mg 200 mL/hr over 60 Minutes Intravenous Every 12 hours 02/28/12 0845 03/01/12 1127   02/28/12 1000  vancomycin (VANCOCIN) 500 mg in sodium chloride irrigation 0.9 % 100 mL ENEMA  Status:  Discontinued     500 mg Rectal 2 times daily 02/28/12 0923 02/28/12 1504   02/27/12 1600  metroNIDAZOLE (FLAGYL) IVPB 500 mg  Status:  Discontinued     500 mg 100 mL/hr over 60 Minutes Intravenous Every 8 hours 02/27/12 1546 02/27/12 2000   02/27/12 1600  ciprofloxacin (CIPRO) IVPB 400 mg  Status:  Discontinued     400 mg 200 mL/hr over 60 Minutes Intravenous Every 12 hours 02/27/12 1547 02/27/12 2000   02/27/12 1545  metroNIDAZOLE (FLAGYL) IVPB 500 mg  Status:  Discontinued     500 mg 100 mL/hr over 60 Minutes Intravenous  Once 02/27/12 1535 02/28/12 0843   02/27/12 1545  vancomycin (VANCOCIN) 500 mg in sodium chloride 0.9 % 100 mL IVPB  Status:  Discontinued     500 mg 100 mL/hr over 60 Minutes Intravenous  Once 02/27/12 1535 02/27/12 1538   02/27/12 1545  ciprofloxacin (CIPRO) IVPB 400 mg  Status:  Discontinued     400 mg 200 mL/hr over 60 Minutes Intravenous  Once 02/27/12 1538 02/28/12 0843   02/27/12 1415  vancomycin (VANCOCIN) 500 mg in sodium chloride 0.9 % 100 mL IVPB     500 mg 100 mL/hr over 60 Minutes Intravenous  Once  02/27/12 1402 02/27/12 1411   02/27/12 1400  vancomycin (VANCOCIN) powder 500 mg  Status:  Discontinued     500 mg Other To Surgery 02/27/12 1359 02/27/12 1402   02/27/12 1200  metroNIDAZOLE (FLAGYL) IVPB 500 mg  Status:  Discontinued    Comments:  First dose ASAP   500 mg 100 mL/hr over 60 Minutes Intravenous Every 6 hours 02/27/12 0953 03/05/12 1346   02/27/12 1200  vancomycin (VANCOCIN) 50 mg/mL oral solution 250 mg  Status:  Discontinued    Comments:  First dose ASAP   250 mg Oral 4 times per day 02/27/12 0953 02/28/12 0814   02/26/12 1700  oseltamivir (TAMIFLU) capsule 75 mg     75 mg Oral  Once 02/26/12 1651 02/26/12 1715      Assessment/Plan: S/p total colectomy,ileostomy, sepsis  1. Agree with continuing tube feeds, will monitor ostomy, once passes swallow can consider giving a diet 2. Continue dressing changes to wound 3. Has high output from left sided drains, I have reviewed op report and last ct scan.  This may very well be due to third spacing and is just peritoneal fluid.  I think though it is reasonable to make sure this is not a ureteral injury and will check fluid from drains. 4. Appreciate ccm assistance.  [facility] 03/18/2012

## 2012-03-19 ENCOUNTER — Inpatient Hospital Stay (HOSPITAL_COMMUNITY): Payer: BC Managed Care – PPO

## 2012-03-19 LAB — BASIC METABOLIC PANEL
BUN: 126 mg/dL — ABNORMAL HIGH (ref 6–23)
Chloride: 125 mEq/L — ABNORMAL HIGH (ref 96–112)
GFR calc Af Amer: 39 mL/min — ABNORMAL LOW (ref >90)
GFR calc Af Amer: 40 mL/min — ABNORMAL LOW (ref >90)
GFR calc non Af Amer: 34 mL/min — ABNORMAL LOW (ref >90)
Potassium: 4.6 mEq/L (ref 3.5–5.1)
Potassium: 4.4 mEq/L (ref 3.5–5.1)
Sodium: 152 mEq/L — ABNORMAL HIGH (ref 135–145)
Sodium: 153 mEq/L — ABNORMAL HIGH (ref 135–145)

## 2012-03-19 LAB — HEPARIN INDUCED THROMBOCYTOPENIA PNL
UFH High Dose UFH H: 1 % Release
UFH Low Dose 0.1 IU/mL: 0 % Release

## 2012-03-19 LAB — GLUCOSE, CAPILLARY
Glucose-Capillary: 193 mg/dL — ABNORMAL HIGH (ref 70–99)
Glucose-Capillary: 196 mg/dL — ABNORMAL HIGH (ref 70–99)
Glucose-Capillary: 200 mg/dL — ABNORMAL HIGH (ref 70–99)
Glucose-Capillary: 204 mg/dL — ABNORMAL HIGH (ref 70–99)

## 2012-03-19 LAB — IRON AND TIBC
Saturation Ratios: 45 % (ref 20–55)
TIBC: 121 ug/dL — ABNORMAL LOW (ref 215–435)
UIBC: 67 ug/dL — ABNORMAL LOW (ref 125–400)

## 2012-03-19 LAB — CBC WITH DIFFERENTIAL/PLATELET
Eosinophils Relative: 0 % (ref 0–5)
HCT: 30.3 % — ABNORMAL LOW (ref 39.0–52.0)
Lymphs Abs: 0.6 10*3/uL — ABNORMAL LOW (ref 0.7–4.0)
MCV: 103.4 fL — ABNORMAL HIGH (ref 78.0–100.0)
Monocytes Relative: 4 % (ref 3–12)
Platelets: 36 10*3/uL — ABNORMAL LOW (ref 150–400)
RBC: 2.93 MIL/uL — ABNORMAL LOW (ref 4.22–5.81)
WBC: 10.8 10*3/uL — ABNORMAL HIGH (ref 4.0–10.5)

## 2012-03-19 LAB — TYPE AND SCREEN

## 2012-03-19 LAB — RENAL FUNCTION PANEL
Albumin: 2.2 g/dL — ABNORMAL LOW (ref 3.5–5.2)
BUN: 138 mg/dL — ABNORMAL HIGH (ref 6–23)
CO2: 20 mEq/L (ref 19–32)
Chloride: 123 mEq/L — ABNORMAL HIGH (ref 96–112)
Glucose, Bld: 162 mg/dL — ABNORMAL HIGH (ref 70–99)
Potassium: 3.4 mEq/L — ABNORMAL LOW (ref 3.5–5.1)

## 2012-03-19 LAB — CBC
HCT: 31.3 % — ABNORMAL LOW (ref 39.0–52.0)
Platelets: 35 10*3/uL — ABNORMAL LOW (ref 150–400)
RDW: 22.9 % — ABNORMAL HIGH (ref 11.5–15.5)
WBC: 9.6 10*3/uL (ref 4.0–10.5)

## 2012-03-19 LAB — BLOOD GAS, VENOUS
FIO2: 0.21 %
Patient temperature: 98.6
pH, Ven: 7.341 — ABNORMAL HIGH (ref 7.250–7.300)

## 2012-03-19 LAB — PHOSPHORUS: Phosphorus: 3.9 mg/dL (ref 2.3–4.6)

## 2012-03-19 LAB — MAGNESIUM: Magnesium: 2.5 mg/dL (ref 1.5–2.5)

## 2012-03-19 MED ORDER — KCL IN DEXTROSE-NACL 20-5-0.45 MEQ/L-%-% IV SOLN
INTRAVENOUS | Status: DC
  Administered 2012-03-19 – 2012-03-22 (×13): via INTRAVENOUS
  Filled 2012-03-19 (×19): qty 1000

## 2012-03-19 MED ORDER — DIPHENHYDRAMINE HCL 50 MG/ML IJ SOLN
INTRAMUSCULAR | Status: AC
  Filled 2012-03-19: qty 1

## 2012-03-19 MED ORDER — INDIGOTINDISULFONATE SODIUM 8 MG/ML IJ SOLN
10.0000 mL | Freq: Once | INTRAMUSCULAR | Status: AC
  Administered 2012-03-19: 80 mg via INTRAVENOUS
  Filled 2012-03-19: qty 10

## 2012-03-19 MED ORDER — HYDROCORTISONE SOD SUCCINATE 100 MG IJ SOLR
50.0000 mg | Freq: Four times a day (QID) | INTRAMUSCULAR | Status: DC
  Administered 2012-03-19 – 2012-03-22 (×13): 50 mg via INTRAVENOUS
  Filled 2012-03-19 (×12): qty 1

## 2012-03-19 MED ORDER — RANITIDINE HCL 150 MG/10ML PO SYRP
150.0000 mg | ORAL_SOLUTION | Freq: Two times a day (BID) | ORAL | Status: DC
  Administered 2012-03-20 – 2012-03-21 (×5): 150 mg
  Filled 2012-03-19 (×7): qty 10

## 2012-03-19 MED ORDER — POTASSIUM CHLORIDE 20 MEQ/15ML (10%) PO LIQD
40.0000 meq | Freq: Once | ORAL | Status: AC
  Administered 2012-03-19: 40 meq
  Filled 2012-03-19: qty 30

## 2012-03-19 NOTE — Progress Notes (Signed)
Patient ID: Connor Small, male   DOB: [birth year], 52 y.o.   MRN: [id number]         [facility]    Date of Admission:  02/26/2012    Total days of antibiotics 22 for C. difficile colitis         Principal Problem:   Leukocytosis, unspecified Active Problems:   C. difficile colitis   Septic shock   Hyperbilirubinemia   UC (ulcerative colitis)   Colonic stricture   S/P cecostomy   ARF (acute renal failure)   Sinus tachycardia   Hematuria   Acute respiratory failure   Pleural effusion   Thrombocytopenia, unspecified   Atrial fibrillation   Anemia   Quadriparesis   . antiseptic oral rinse  15 mL Mouth Rinse q12n4p  . chlorhexidine  15 mL Mouth Rinse BID  . famotidine  20 mg Per Tube BID  . feeding supplement  30 mL Per Tube TID  . free water  200 mL Per Tube QID  . hydrocortisone sodium succinate  50 mg Intravenous Q6H  . insulin aspart  0-20 Units Subcutaneous Q4H  . insulin glargine  40 Units Subcutaneous Daily  . lactulose  30 g Per Tube BID  . metronidazole  500 mg Intravenous Q8H  . sodium chloride  10-40 mL Intracatheter Q12H  . vancomycin  500 mg Oral Q6H    Subjective: He is feeling better. He denies any itching related to his new rash.  Objective: Temp:  [96.1 F (35.6 C)-97.5 F (36.4 C)] 96.9 F (36.1 C) (02/24 1201) Pulse Rate:  [79-101] 79 (02/24 1400) Resp:  [9-20] 9 (02/24 1400) BP: (89-131)/(42-69) 96/54 mmHg (02/24 1400) SpO2:  [98 %-100 %] 98 % (02/24 1400) Arterial Line BP: (98-122)/(37-65) 101/43 mmHg (02/24 1300)  General: More alert and interactive Skin: Blanching red, macular rash over lower abdomen and thighs Lungs: Clear Cor: Regular S1-S2 no murmurs Abdomen: He now has a VAC dressing on his dehisced abdominal wound  Lab Results Lab Results  Component Value Date   WBC 10.8* 03/19/2012   HGB 9.2* 03/19/2012   HCT 30.3* 03/19/2012   MCV 103.4* 03/19/2012   PLT 36* 03/19/2012    Lab Results  Component  Value Date   CREATININE 2.37* 03/19/2012   BUN 138* 03/19/2012   NA 153* 03/19/2012   K 3.4* 03/19/2012   CL 123* 03/19/2012   CO2 20 03/19/2012    Lab Results  Component Value Date   ALT 36 03/16/2012   AST 65* 03/16/2012   ALKPHOS 377* 03/16/2012   BILITOT 14.1* 03/17/2012     Assessment: Recent cultures were negative and he remains afebrile. His white blood cell count has come down dramatically. I agree with stopping all antibiotic therapy at this time. The cause of his new rash is not clear.  Plan: 1. Discontinue oral vancomycin and IV metronidazole 2. Plans discussed with patient and wife  Michel Bickers, MD [facility] [employer] [phone] pager   [phone] cell 03/19/2012, 3:06 PM

## 2012-03-19 NOTE — Progress Notes (Signed)
eLink Physician-Brief Progress Note Patient Name: Connor Small DOB: 05-30-1960 MRN: [id number]  Date of Service  03/19/2012   HPI/Events of Note   Hypokalemia in the setting of renal insufficiency  eICU Interventions  Potassium replaced   Intervention Category Minor Interventions: Electrolytes abnormality - evaluation and management  Lacy Taglieri 03/19/2012, 5:35 AM

## 2012-03-19 NOTE — Progress Notes (Signed)
PULMONARY  / CRITICAL CARE MEDICINE  Name: Connor Small MRN: [id number] DOB: 07-Feb-1960    ADMISSION DATE:  02/26/2012 CONSULTATION DATE:  2/5  REFERRING MD :  Karilyn Cota  CHIEF COMPLAINT:  Acute resp failure   BRIEF PATIENT DESCRIPTION:  36 YOM admitted to [facility] for Cdiff on 2/2. Underwent decompressive cecostomy 2/3, initially was better, then worsened on 2/4 with increased abdominal distension and tenderness, decreased renal function.  He was taken back to the OR on 2/4 for total abdominal colectomy and end ileostomy.  Post op he developed ARDS and sepsis shock and was being treated for this.  The morning of 2/6 he had new onset of A-fib and transferred to [facility].    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 Overlying bowel gas and patientclinical status. 2. No explanation for elevated liver function tests.3. Similar to slight increase in size of a perisplenic fluidcollection.4. Right pleural effusion 2/20- reduction pressors, improved alertness 2/21- bleeding overnight from JP's, argatroban turned off, lower pressor needs 2/22- off pressors (yeah), on RA (yeah) 2/23- neg from JPs again, inr  reducing 2/24- concern dehiscence , some improved renal fxn with increased volume  LINES / TUBES: ETT - OSH 2/4>>> Foley-OSH 2/3>>> PICC OSH 2/4>>>2/11 Art Line - OSH 2/5>>>out A line rt fem 2/18>>>DO not remove until coags corrected Rt ij HD 2/17>>> Left IJ 2/18>>>  CULTURES: BCx2 2/4>>>negative BCx2 2/3>>>negative UC 2/3- Negative MRSA PCR 2/3- Negative C diff 2/6>>>Positive Body fluid 2/9>>>WBCs, no organisms 2/15 BC x 2 > 2/15 Sputum >>>NF  ANTIBIOTICS: Flagyl 2/3>>> Vanc 2/6 (IV) >>>2/7 azactam 2/6>>>2/7 Oral vanc 2/6>>> IV vanc 2/16 >2/24 Aztreonam 2/16 >2/17 Imipenem 2/17>>>2/24 mycofungin 2/18>>>2/24  SUBJECTIVE:  Rash worse  VITAL SIGNS: Temp:  [97.3 F (36.3 C)-98.9 F (37.2 C)] 97.3 F (36.3 C) (02/24 0405) Pulse Rate:  [87-105] 101 (02/24 0700) Resp:  [11-22] 17 (02/24 0700) BP: (86-112)/(43-62) 101/43 mmHg (02/24 0700) SpO2:  [98 %-100 %] 100 % (02/24 0700) Arterial Line BP: (96-122)/(44-61) 107/55 mmHg (02/24 0700) HEMODYNAMICS: CVP:  [3 mmHg] 3 mmHg VENTILATOR SETTINGS:   INTAKE / OUTPUT: Intake/Output     02/23 0701 - 02/24 0700 02/24 0701 - 02/25 0700   I.V. (mL/kg) 2327.5 (27.5)    NG/GT 1900    IV Piggyback 2200    Total Intake(mL/kg) 6427.5 (76.1)    Urine (mL/kg/hr) 2060 (1) 250 (2.1)   Drains 1580 (0.8) 100 (0.8)   Stool 2050 (1)    Total Output 5690 350   Net +737.5 -350          PHYSICAL EXAMINATION: General awake, alert, weak voice but improved Neuro: awake, intact, no changes weakness rue HEENT: lines clean neck Cardiovascular: s1 s2 rrr  Lungs: clear anterior Abdomen: nonDistended, wound site clean and dry, reduced BS  At best, ostomy ok, belly remains soft Musculoskeletal: 1+ edema about unchanged last 24 hrs Skin: follicular mild anterior rt thigh rash increased on to chest lower portion and now bilateral  LABS:  Recent Labs Lab 03/13/12 0700  03/14/12 0405 03/14/12 1123  03/15/12 0450 03/15/12 0458   03/15/12 1100  03/16/12 0450  03/17/12 0420 03/17/12 1115 03/18/12 0503 03/18/12 1600 03/19/12 0440  HGB  --   --  16.0  --   --  12.4*  --   --   --   < > 10.6*  --  9.1*  --  10.2*  --  9.2*  WBC  --   --  29.0*  --   --  27.1*  --   --   --   < > 20.1*  --  15.2*  --  12.0*  --  10.8*  PLT  --   --  105*  --   --  58*  --   --   --   < > 32*  --  30*  --  35*  --  36*  NA  --   < > 130*  --   < > 135  --   --   --   < > 137  < > 144  --  150* 154* 153*  K  --   < > 4.5  --   < > 4.7  --   --   --   < > 4.1  < > 3.5  --  2.9* 3.2* 3.4*  CL  --   < > 97  --   < > 99  --   --   --   < > 103  < > 108  --  114* 121* 123*  CO2  --   < > 22  --   < > 24  --   --   --   < > 21  < > 20  --  22 20 20   GLUCOSE  --   < > 224*  --   < > 143*  --   --   --   < > 125*  < > 165*  --  181* 190* 162*  BUN  --   < > 91*  --   < > 78*  --   --   --   < > 112*  < > 135*  --  150* 145* 138*  CREATININE  --   < > 1.67*  --   < > 1.58*  --   --   --   < > 2.29*  < > 2.63*  --  2.61* 2.51* 2.37*  CALCIUM  --   < > 7.0*  --   < > 7.8*  --   --   --   < > 7.8*  < > 7.8*  --  7.6* 8.0* 7.8*  MG  --   --  2.6*  --   --  2.8*  --   --   --   --  3.0*  --  3.1*  --   --   --   --   PHOS  --   < > 6.0*  --   < > 5.1*  --   --   --   < > 5.9*  < > 6.7*  --  5.8*  --  4.2  AST  --   --  75*  --   --  62*  --   --   --   --  65*  --   --   --   --   --   --   ALT  --   --  59*  --   --  43  --   --   --   --  36  --   --   --   --   --   --   ALKPHOS  --   --  603*  --   --  386*  --   --   --   --  377*  --   --   --   --   --   --   BILITOT 13.9*  --  13.9*  --   --  15.5*  --   --   --   --  15.4*  --   --  14.1*  --   --   --   PROT  --   --  4.6*  --   --  4.6*  --   --   --   --  4.2*  --   --   --   --   --   --   ALBUMIN  --   < > 1.6*  --   < > 2.5*  --   --   --   < > 2.4*  < > 2.5*  --  2.2*  --  2.2*  APTT 56*  --  163*  --   < >  --   --   < >  --   < > 84*  < > 63*  --  43*  --  35  INR 1.51*  --  1.55*   --   --   --   --   --   --   --  4.71*  --  3.78*  --  2.23*  --  1.92*  LATICACIDVEN 3.3*  --   --   --   --   --   --   --   --   --   --   --   --   --   --   --   --   PHART  --   < >  --  7.416  --   --  7.440  --  7.475*  --   --   --   --   --   --   --   --   PCO2ART  --   < >  --  35.0  --   --  36.8  --  32.2*  --   --   --   --   --   --   --   --   PO2ART  --   < >  --  154.0*  --   --  194.0*  --  130.0*  --   --   --   --   --   --   --   --   < > = values in this interval not displayed.  Recent Labs Lab 03/18/12 1054 03/18/12 1521 03/18/12 1954 03/19/12 0020 03/19/12 0400  GLUCAP 148* 142* 197* 200* 162*    Imaging: No results found. CXR 2/20 >mild hazziness rt base, ett wnl None new  ASSESSMENT / PLAN:  PULMONARY A: Acute respiratory failure in setting of septic shock and volume overload.  Lt pleural effusion.  Extubated 2/20  P:   IS Encourage cough  CARDIOVASCULAR A: Septic Shock - Secondary to C-diff colitis; off pressors 2/08. Restarted 2/13 (relatd to volume?) Atrial Fibrillation with RVR.> Low [place] score. High output from JP's remains 2/22 - high risk hypotension from volume depletion from ATN output and JP's Clot around picc, dvt rue 2/11 Shock reoccurrence, related to sepsis? Volume? 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 Hx of ulcerative colitis. cholestatic picture conjugating, related to sepsis, voluem status - improved 2/23, slow improveemnt  P:   Tf tolerated still, continue at goal pepcid Trend bili, awaited  this am , last 14 slp can be redone, will call back, wound not an inhibitor surgery recs a dye to see if ureter patent Max protein lactulose  HEMATOLOGIC A: Leukocytosis dvt picc aassociated rue 2/11 Small splenic hematoma? Thrombocytopenia r/o HITT 2/19, increased 2/23 Dopplers legs - neg lowers P:  Follow HITT assay- should be back by tues Continue to hold argatroban with bloody drainage prior and lFt contrubtion, will dc aline now Will repeat coags daily, improved daily No role plat tx  INFECTIOUS A: C diff colitis, clinically progressing, Worsening rash r/o IMi? P:   Dc imi, vamc myco day 8 anyway, will d/w ID  ENDOCRINE A: Hyperglycemia. Controlled NICE wnl Relative adrenal insufficiency. P:   SSI Lantus to continue with TF Solucortef to re increase with rash If drops BP, re increase  NEUROLOGIC A:  Acute encephalopathy R/o stroke vs crit illness myopathy / neuroapthy Will need MRI brain and neck, wait till safe for this P:   Aggressive pt/ ot, splints Order MRi delay with wound Carotids pending Lactulose on going No role ammonia labs  Wife updated NOt going to OR  Mcarthur Rossetti. Tyson Alias, MD, FACP Pgr: [phone] [facility]

## 2012-03-19 NOTE — Progress Notes (Signed)
SLP Cancellation Note  Patient Details Name: Connor Small MRN: [id number] DOB: 1960/06/04   Cancelled treatment:       Reason Eval/Treat Not Completed: Patient not medically ready;Medical issues which prohibited therapy. Given complications with wound, SLP will defer evaluation of swallow function today, pt should continue with alternate nutrition. Will f/u via chart tomorrow for readiness.   [name], [place] CCC-SLP [phone]  [name] 03/19/2012, 8:22 AM

## 2012-03-19 NOTE — Progress Notes (Signed)
Physical Therapy Treatment Patient Details Name: Connor Small MRN: [id number] DOB: 1960-07-03 Today's Date: 03/19/2012 Time: 4098-1191 PT Time Calculation (min): 32 min  PT Assessment / Plan / Recommendation Comments on Treatment Session  52 yo admitted from [facility] with fever, LBP and 10 episodes of watery diarrhea. P underwent decompressive cecostomy 2/3, then developed increased abdomainl distention and decrased renal funciton. OR 2/4 for total abdominal colectomy and end ileostomy. Developed postop ARDS and septic shock. 2/6 A fib and transferred to [facility]. Intubated at [facility]. Extubatted. Blood clot @ R UE Picc line site. Abdoinal wound dehiscence and wound vac placed 2/24. discussed with PA for Dr. Lindie Spruce who cleared pt for mobility. Eval limited due to femoral line. Pt will benefit from PT to max indepenence functional mobility and to facilitate D/C to next venue due to below deficits. Pt will be an excellent CIR candidate when medically ready.   Again limited due to femoral line which is to be removed today. Pt had a successful session of AA/PROM and repositioning afterwared    Follow Up Recommendations  CIR;Other (comment) (once medically ready)     Does the patient have the potential to tolerate intense rehabilitation     Barriers to Discharge        Equipment Recommendations  Other (comment) (TBA)    Recommendations for Other Services Rehab consult (not ready yet medically)  Frequency     Plan Discharge plan needs to be updated;Frequency remains appropriate    Precautions / Restrictions Precautions Precautions: Fall Precaution Comments: contact   Pertinent Vitals/Pain Vital stable     Mobility  Bed Mobility Bed Mobility: Not assessed (femoral line connect-- To be D/C'd today) Details for Bed Mobility Assistance: only slid up in bed due to precautions for a line Transfers Transfers: Not assessed Ambulation/Gait Ambulation/Gait Assistance: Not tested (comment)    Exercises  General Exercises - Lower Extremity Ankle Circles/Pumps: PROM;Both;5 reps;Other (comment) (heel cord stretch) Heel Slides: PROM;AAROM;10 reps;Other (comment);Left;Both;Supine (with stretch L,  Limited R LE due to femoral line) Hip ABduction/ADduction: PROM;AAROM;Both;10 reps Other Exercises Other Exercises: TAUGHT WIFE R HAND/WRIST rom Other Exercises: edema control/retrograde massage - wife taught Other Exercises: PT assist with UE ROM   PT Diagnosis:    PT Problem List:   PT Treatment Interventions:     PT Goals Acute Rehab PT Goals PT Goal Formulation: With patient Time For Goal Achievement: 03/26/12 Potential to Achieve Goals: Fair Pt will go Supine/Side to Sit: with mod assist;with rail PT Goal: Supine/Side to Sit - Progress: Not met Pt will Sit at Edge of Bed: with mod assist;1-2 min;with bilateral upper extremity support PT Goal: Sit at [place] Of Bed - Progress: Not met PT Goal: Sit to Stand - Progress: Not met Pt will Perform Home Exercise Program: Other (comment) (mod) PT Goal: Perform Home Exercise Program - Progress: Progressing toward goal  Visit Information  Last PT Received On: 03/19/12 Assistance Needed: +2 PT/OT Co-Evaluation/Treatment: Yes    Subjective Data  Subjective: not verbal eventhough extubated Patient Stated Goal: Unable to state   Cognition  Cognition Overall Cognitive Status: Impaired Area of Impairment: Attention;Following commands;Problem solving Arousal/Alertness: Awake/alert Behavior During Session: Flat affect Current Attention Level: Sustained Attention - Other Comments: mod vc to redirect to task Following Commands: Follows one step commands inconsistently    Balance  Balance Balance Assessed: No  End of Session PT - End of Session Activity Tolerance: Other (comment);Patient limited by fatigue (limited by lethargy and limitations of lines) Patient left:  in bed;with bed alarm set;with call bell/phone within reach Nurse Communication:  Mobility status   GP     [place]keley Vanaken, Eliseo Gum 03/19/2012, 1:03 PM 03/19/2012  Ovid Bing, PT [phone] (pager)

## 2012-03-19 NOTE — Progress Notes (Signed)
Orthopedic Tech Progress Note Patient Details:  Connor Small 1961/01/07 [id number]  Ortho Devices Type of Ortho Device: Abdominal binder Ortho Device/Splint Location: abdomen Ortho Device/Splint Interventions: Ordered Abdominal binder given to rn to apply;viewed order from rn order list  Hildred Priest 03/19/2012, 2:33 PM

## 2012-03-19 NOTE — Progress Notes (Signed)
Pt. Was given Indigo Carmine IV with noted blue hue to urine after infusion. Both JP drains went from a yellow drainage to a greenish drainage, and then transitioned back to the yellow drainage. Dr. Chase Caller made aware.

## 2012-03-19 NOTE — Progress Notes (Signed)
UR Completed.  Vergie Living [phone] 03/19/2012

## 2012-03-19 NOTE — Progress Notes (Signed)
Speech Language Pathology Dysphagia Treatment Patient Details Name: Connor Small MRN: [id number] DOB: 02-07-1960 Today's Date: 03/19/2012 Time: 8185-6314 SLP Time Calculation (min): 21 min  Assessment / Plan / Recommendation Clinical Impression  MD paged SLP to reassess pt, no plans for operation on wound and no barriers to PO intake today. Therapy session to determine readiness for FEES or to initiate PO. Pt awake, following commands with cues, but attention impaired, minimally responsive to environment. Per RN and family he had an overstimulating morning. SLP offered trials of ice chips and two bites of puree, pt required max verbal cues to initiate each step of oral prepartaion and transit (close lips, move it back, swallow). Laryngeal elevation weak, sluggish. After foruth bolus, pt unable to initiate swallow due to fatigue.   Though pt did consume a few PO trials, feel that pts endurance for FEES today or PO consumption would be poor. Hopeful for improved strength for objective testing tomorrow am. Continue alternate nutrition.     Diet Recommendation  Continue with Current Diet: NPO    SLP Plan     Pertinent Vitals/Pain NA   Swallowing Goals  SLP Swallowing Goals Goal #3: Patient will demonstrate ability to close vocal cords with cough and with verbalizations with mod. verbal and tactile cues. Swallow Study Goal #3 - Progress: Progressing toward goal Goal #4: Patient will complete objective swallow study prior to begining po diet. Swallow Study Goal #4 - Progress: Progressing toward goal  General Temperature Spikes Noted: No Respiratory Status: Room air Behavior/Cognition: Lethargic;Requires cueing;Decreased sustained attention Oral Cavity - Dentition: Adequate natural dentition Patient Positioning: Upright in bed  Oral Cavity - Oral Hygiene Patient is HIGH RISK - Oral Care Protocol followed (see row info): Yes   Dysphagia Treatment Treatment focused on: Facilitation of  pharyngeal phase;Facilitation of oral phase;Patient/family/caregiver education;Upgraded PO texture trials Family/Caregiver Educated: wife Treatment Methods/Modalities: Skilled observation Patient observed directly with PO's: Yes Type of PO's observed: Dysphagia 1 (puree);Ice chips Feeding: Total assist Oral Phase Signs & Symptoms: Prolonged bolus formation;Prolonged oral phase Pharyngeal Phase Signs & Symptoms: Suspected delayed swallow initiation Type of cueing: Verbal;Tactile Amount of cueing: Moderate   GO     Jaxson Keener, Katherene Ponto 03/19/2012, 9:39 AM

## 2012-03-19 NOTE — Progress Notes (Signed)
[facility]/Connor Small Progress Note 20 Days Post-Op  Subjective: Patient NPO for possible trip back to the OR.  He will not need to go back to the OR today.  Objective: Vital signs in last 24 hours: Temp:  [97.3 F (36.3 C)-98.9 F (37.2 C)] 97.3 F (36.3 C) (02/24 0405) Pulse Rate:  [87-105] 101 (02/24 0700) Resp:  [11-22] 17 (02/24 0800) BP: (86-112)/(43-62) 91/53 mmHg (02/24 0800) SpO2:  [98 %-100 %] 100 % (02/24 0800) Arterial Line BP: (96-122)/(44-61) 106/54 mmHg (02/24 0800) Last BM Date: 03/18/12  Intake/Output from previous day: 02/23 0701 - 02/24 0700 In: 6427.5 [I.V.:2327.5; NG/GT:1900; IV Piggyback:2200] Out: 3825 [Urine:2060; Drains:1580; Stool:2050] Intake/Output this shift: Total I/O In: -  Out: 625 [Urine:375; Drains:250]  General: Frightened, but no acute distress  Lungs: Clear  Abd: Small fascial dehiscence in the upper 1/3 of the wound with omentum, not bowel, underneath.the wound itself is desiccated and not healing well.  Still large amount of serous drainage from [place].  Creatinine 2.9, but will give indigo carmine to address the possibility of ureteral injury.  Extremities: No change  Neuro: Intact  Lab Results:  @LABLAST2 (wbc:2,hgb:2,hct:2,plt:2) BMET  Recent Labs  03/18/12 1600 03/19/12 0440  NA 154* 153*  K 3.2* 3.4*  CL 121* 123*  CO2 20 20  GLUCOSE 190* 162*  BUN 145* 138*  CREATININE 2.51* 2.37*  CALCIUM 8.0* 7.8*   PT/INR  Recent Labs  03/18/12 0503 03/19/12 0440  LABPROT 23.7* 21.2*  INR 2.23* 1.92*   ABG No results found for this basename: PHART, PCO2, PO2, HCO3,  in the last 72 hours  Studies/Results: No results found.  Anti-infectives: Anti-infectives   Start     Dose/Rate Route Frequency Ordered Stop   03/18/12 1000  vancomycin (VANCOCIN) 1,500 mg in sodium chloride 0.9 % 500 mL IVPB  Status:  Discontinued     1,500 mg 250 mL/hr over 120 Minutes Intravenous Every 48 hours 03/17/12 1920 03/19/12 0840   03/14/12 1000   micafungin (MYCAMINE) 100 mg in sodium chloride 0.9 % 100 mL IVPB  Status:  Discontinued     100 mg 100 mL/hr over 1 Hours Intravenous Daily 03/13/12 0632 03/19/12 0840   03/13/12 1000  micafungin (MYCAMINE) 100 mg in sodium chloride 0.9 % 100 mL IVPB     100 mg 100 mL/hr over 1 Hours Intravenous Daily 03/13/12 0632 03/13/12 1311   03/12/12 1800  vancomycin (VANCOCIN) IVPB 1000 mg/200 mL premix  Status:  Discontinued     1,000 mg 200 mL/hr over 60 Minutes Intravenous Every 24 hours 03/12/12 0859 03/17/12 1017   03/12/12 1000  imipenem-cilastatin (PRIMAXIN) 500 mg in sodium chloride 0.9 % 100 mL IVPB  Status:  Discontinued     500 mg 200 mL/hr over 30 Minutes Intravenous 3 times per day 03/12/12 0848 03/12/12 0909   03/12/12 1000  imipenem-cilastatin (PRIMAXIN) 250 mg in sodium chloride 0.9 % 100 mL IVPB  Status:  Discontinued     250 mg 200 mL/hr over 30 Minutes Intravenous 4 times per day 03/12/12 0909 03/19/12 0841   03/11/12 1200  vancomycin (VANCOCIN) 750 mg in sodium chloride 0.9 % 150 mL IVPB  Status:  Discontinued     750 mg 150 mL/hr over 60 Minutes Intravenous Every 12 hours 03/11/12 0204 03/12/12 0858   03/11/12 0215  vancomycin (VANCOCIN) 750 mg in sodium chloride 0.9 % 150 mL IVPB     750 mg 150 mL/hr over 60 Minutes Intravenous  Once 03/11/12 0204 03/11/12 0352  03/11/12 0215  aztreonam (AZACTAM) 1 g in dextrose 5 % 50 mL IVPB  Status:  Discontinued     1 g 100 mL/hr over 30 Minutes Intravenous 3 times per day 03/11/12 0204 03/12/12 0851   03/05/12 2200  metroNIDAZOLE (FLAGYL) IVPB 500 mg    Comments:  First dose ASAP   500 mg 100 mL/hr over 60 Minutes Intravenous Every 8 hours 03/05/12 1346     03/02/12 1200  vancomycin (VANCOCIN) 50 mg/mL oral solution 500 mg     500 mg Oral 4 times per day 03/02/12 0913     03/01/12 1445  vancomycin (VANCOCIN) 50 mg/mL oral solution 500 mg  Status:  Discontinued     500 mg Oral 3 times per day 03/01/12 1340 03/02/12 0913   03/01/12  1400  aztreonam (AZACTAM) 1 g in dextrose 5 % 50 mL IVPB  Status:  Discontinued     1 g 100 mL/hr over 30 Minutes Intravenous 3 times per day 03/01/12 1356 03/02/12 1146   03/01/12 1300  vancomycin (VANCOCIN) 1,250 mg in sodium chloride 0.9 % 250 mL IVPB  Status:  Discontinued     1,250 mg 166.7 mL/hr over 90 Minutes Intravenous Every 12 hours 03/01/12 1127 03/02/12 1146   02/29/12 0600  ciprofloxacin (CIPRO) IVPB 400 mg     400 mg 200 mL/hr over 60 Minutes Intravenous On call to O.R. 02/28/12 1409 02/28/12 1438   02/28/12 2200  vancomycin (VANCOCIN) 500 mg in sodium chloride irrigation 0.9 % 60 mL ENEMA  Status:  Discontinued     500 mg Rectal 2 times daily 02/28/12 1504 02/28/12 1817   02/28/12 1200  vancomycin (VANCOCIN) 50 mg/mL oral solution 500 mg  Status:  Discontinued    Comments:  First dose ASAP   500 mg Per Tube 4 times per day 02/28/12 0814 02/28/12 1817   02/28/12 1000  vancomycin (VANCOCIN) IVPB 1000 mg/200 mL premix  Status:  Discontinued     1,000 mg 200 mL/hr over 60 Minutes Intravenous Every 12 hours 02/28/12 0845 03/01/12 1127   02/28/12 1000  vancomycin (VANCOCIN) 500 mg in sodium chloride irrigation 0.9 % 100 mL ENEMA  Status:  Discontinued     500 mg Rectal 2 times daily 02/28/12 0923 02/28/12 1504   02/27/12 1600  metroNIDAZOLE (FLAGYL) IVPB 500 mg  Status:  Discontinued     500 mg 100 mL/hr over 60 Minutes Intravenous Every 8 hours 02/27/12 1546 02/27/12 2000   02/27/12 1600  ciprofloxacin (CIPRO) IVPB 400 mg  Status:  Discontinued     400 mg 200 mL/hr over 60 Minutes Intravenous Every 12 hours 02/27/12 1547 02/27/12 2000   02/27/12 1545  metroNIDAZOLE (FLAGYL) IVPB 500 mg  Status:  Discontinued     500 mg 100 mL/hr over 60 Minutes Intravenous  Once 02/27/12 1535 02/28/12 0843   02/27/12 1545  vancomycin (VANCOCIN) 500 mg in sodium chloride 0.9 % 100 mL IVPB  Status:  Discontinued     500 mg 100 mL/hr over 60 Minutes Intravenous  Once 02/27/12 1535 02/27/12 1538    02/27/12 1545  ciprofloxacin (CIPRO) IVPB 400 mg  Status:  Discontinued     400 mg 200 mL/hr over 60 Minutes Intravenous  Once 02/27/12 1538 02/28/12 0843   02/27/12 1415  vancomycin (VANCOCIN) 500 mg in sodium chloride 0.9 % 100 mL IVPB     500 mg 100 mL/hr over 60 Minutes Intravenous  Once 02/27/12 1402 02/27/12 1411   02/27/12 1400  vancomycin (VANCOCIN) powder 500 mg  Status:  Discontinued     500 mg Other To Surgery 02/27/12 1359 02/27/12 1402   02/27/12 1200  metroNIDAZOLE (FLAGYL) IVPB 500 mg  Status:  Discontinued    Comments:  First dose ASAP   500 mg 100 mL/hr over 60 Minutes Intravenous Every 6 hours 02/27/12 0953 03/05/12 1346   02/27/12 1200  vancomycin (VANCOCIN) 50 mg/mL oral solution 250 mg  Status:  Discontinued    Comments:  First dose ASAP   250 mg Oral 4 times per day 02/27/12 0953 02/28/12 0814   02/26/12 1700  oseltamivir (TAMIFLU) capsule 75 mg     75 mg Oral  Once 02/26/12 1651 02/26/12 1715      Assessment/Plan: s/p Procedure(s): TOTAL COLECTOMY ILEOSTOMY VAC dressing placed. by me with Mepitel dressing on top of open area with omentum.  LOS: 22 days   Kathryne Eriksson. Dahlia Bailiff, MD, FACS [phone] [phone] [facility] Surgery 03/19/2012

## 2012-03-19 NOTE — Progress Notes (Signed)
eLink Physician-Brief Progress Note Patient Name: Connor Small DOB: Sep 14, 1960 MRN: [id number]  Date of Service  03/19/2012   HPI/Events of Note   Called by bedside RN, wife concerned that patient's HR increased from 26 to 19.  Camera on, stable vitals but "feels strange" which the wife is concerned about.  Spoke with the wife over the phone.  She requested that a physician comes in to see him but unfortunately on call MD is in the ED likely for the next several hours.  CBC ordered for ?bleed and will monitor.  eICU Interventions        Honorio Devol 03/19/2012, 10:05 PM

## 2012-03-19 NOTE — Progress Notes (Signed)
Subjective: Interval History: none.  Objective: Vital signs in last 24 hours: Temp:  [97.3 F (36.3 C)-98.9 F (37.2 C)] 97.3 F (36.3 C) (02/24 0405) Pulse Rate:  [87-105] 101 (02/24 0700) Resp:  [11-22] 17 (02/24 0700) BP: (86-112)/(43-62) 101/43 mmHg (02/24 0700) SpO2:  [98 %-100 %] 100 % (02/24 0700) Arterial Line BP: (96-122)/(44-61) 107/55 mmHg (02/24 0700) Weight change:   Intake/Output from previous day: 02/23 0701 - 02/24 0700 In: 6427.5 [I.V.:2327.5; NG/GT:1900; IV Piggyback:2200] Out: 3748 [Urine:2060; Drains:1580; Stool:2050] Intake/Output this shift: Total I/O In: -  Out: 350 [Urine:250; Drains:100]  General appearance: cooperative, slowed mentation, toxic and jaundiced Resp: diminished breath sounds bilaterally and rales bibasilar Cardio: systolic murmur: holosystolic 2/6, blowing at apex GI: wound, liver down 4 cm Skin: jaundiced, mcaular, papular rash, legs,abdm.  Lab Results:  Recent Labs  03/18/12 0503 03/19/12 0440  WBC 12.0* 10.8*  HGB 10.2* 9.2*  HCT 31.9* 30.3*  PLT 35* 36*   BMET:  Recent Labs  03/18/12 1600 03/19/12 0440  NA 154* 153*  K 3.2* 3.4*  CL 121* 123*  CO2 20 20  GLUCOSE 190* 162*  BUN 145* 138*  CREATININE 2.51* 2.37*  CALCIUM 8.0* 7.8*   No results found for this basename: PTH,  in the last 72 hours Iron Studies: No results found for this basename: IRON, TIBC, TRANSFERRIN, FERRITIN,  in the last 72 hours  Studies/Results: No results found.  I have reviewed the patient's current medications.  Assessment/Plan: 1 AKI Cr trending down.  Diuresing and losing fluid via ostomy.  Mild acidemia, vol low intravasc despite edema.  Needs Na, H20, K. 2 Anemia stable 3 UC 4 S/P resx, C. Diff 5 Jaundice ?? If sepsis, vs UC assoc 6 Nutrition 7 Rash suspect Primaxin vs Fungus 8 Low Ptlt P Discuss stop Primaxin, change fluids to D5 1/2 with K,     LOS: 22 days   Renda Pohlman L 03/19/2012,7:46 AM

## 2012-03-19 NOTE — Progress Notes (Signed)
Occupational Therapy Evaluation Patient Details Name: Connor Small MRN: [id number] DOB: 1960/11/29 Today's Date: 03/19/2012 Time: 4098-1191 OT Time Calculation (min): 32 min  OT Assessment / Plan / Recommendation Clinical Impression  52 yo admitted with AP with fever, LBP and 10 episodes of watery diarrhea. Pt underwent decompressive cecostomy 2/3, then developed increased abdomainl distention and decrased renal funciton. OR 2/4 for total abdominal colectomy and end ileostomy. Developed postop ARDS and septic shock. 2/6 A fib and transferred to [facility]. Intubated at [facility]. Extubated. Blood clot @ R UE Picc line site. Abdominal wound dehiscence and wound vac placed 2/24. Discussed with PA for Dr. Lindie Spruce who cleared pt for mobility. Eval limited due to femoral line. Pt will benefit from OT to max indepenence with ADL and functional mobility for ADL to facilitate D/C to next venue due to below deficits. Pt will be an excellent CIR candidate when medically ready. will follow acutely    OT Assessment  Patient needs continued OT Services    Follow Up Recommendations  CIR    Barriers to Discharge None    Equipment Recommendations  3 in 1 bedside comode;Tub/shower bench    Recommendations for Other Services Rehab consult  Frequency  Min 3X/week    Precautions / Restrictions Precautions Precautions: Fall Precaution Comments: contact   Pertinent Vitals/Pain Vitals stable    ADL  Eating/Feeding: NPO Grooming: +1 Total assistance Upper Body Bathing: +1 Total assistance Lower Body Bathing: +1 Total assistance Upper Body Dressing: +1 Total assistance Lower Body Dressing: +1 Total assistance Toilet Transfer: +2 Total assistance;Simulated Transfers/Ambulation Related to ADLs: unable to assess transfers due to femoral line    OT Diagnosis: Generalized weakness;Cognitive deficits;Acute pain;Hemiplegia dominant side;Altered mental status  OT Problem List: Decreased strength;Decreased range of  motion;Decreased activity tolerance;Decreased coordination;Decreased cognition;Decreased knowledge of use of DME or AE;Decreased knowledge of precautions;Cardiopulmonary status limiting activity;Impaired tone;Impaired UE functional use;Pain;Increased edema OT Treatment Interventions: Self-care/ADL training;Therapeutic exercise;Neuromuscular education;DME and/or AE instruction;Therapeutic activities;Cognitive remediation/compensation;Patient/family education;Balance training   OT Goals Acute Rehab OT Goals OT Goal Formulation: Patient unable to participate in goal setting Time For Goal Achievement: 04/02/12 Potential to Achieve Goals: Good ADL Goals Pt Will Perform Grooming: with mod assist;Sitting, chair;Supported;with cueing (comment type and amount) ADL Goal: Grooming - Progress: Goal set today Pt Will Perform Upper Body Bathing: with mod assist;Sitting, chair;Supported;with cueing (comment type and amount) ADL Goal: Upper Body Bathing - Progress: Goal set today Additional ADL Goal #1: Pt will complete bed mobility with mod A in preparation for ADL.  ADL Goal: Additional Goal #1 - Progress: Goal set today Additional ADL Goal #2: Pt will sit EOB with min A supported in preparation for ADL ADL Goal: Additional Goal #2 - Progress: Goal set today Arm Goals Additional Arm Goal #1: Wife will complete P/AAROM BUE with S Arm Goal: Additional Goal #1 - Progress: Goal set today Miscellaneous OT Goals Miscellaneous OT Goal #1: Wife will complete edema control techniques with S R hand OT Goal: Miscellaneous Goal #1 - Progress: Goal set today  Visit Information  Last OT Received On: 03/19/12 Assistance Needed: +2 PT/OT Co-Evaluation/Treatment: Yes    Subjective Data  Patient Stated Goal: unable to state   Prior Functioning     Home Living Lives With: Spouse Available Help at Discharge: Family Type of Home: House Additional Comments: family not available for further quextions Prior  Function Level of Independence: Independent Communication Communication: Other (comment);Expressive difficulties (slow speech. whisper voice)  Vision/Perception Vision - History Baseline Vision: No visual deficits Vision - Assessment Eye Alignment: Within Functional Limits Vision Assessment: Vision not tested Additional Comments: will further assess. decrased saccades and pursuits Perception Perception: Impaired Praxis Praxis: Impaired   Cognition  Cognition Overall Cognitive Status: Impaired Area of Impairment: Attention;Following commands;Problem solving Arousal/Alertness: Awake/alert Behavior During Session: Flat affect Current Attention Level: Sustained Attention - Other Comments: mod vc to redirect to task Following Commands: Follows one step commands inconsistently    Extremity/Trunk Assessment Right Upper Extremity Assessment RUE ROM/Strength/Tone: Deficits RUE ROM/Strength/Tone Deficits: active shoulder shrug oncommand. min gross grasp with wrist flexion. no active release. no active elbow flex.ext. no increase in tone. subluxed (inferior)shoulder RUE Coordination: Deficits RUE Coordination Deficits: nonfunctional arm/hand Left Upper Extremity Assessment LUE ROM/Strength/Tone: Deficits LUE ROM/Strength/Tone Deficits: shoulder @ 2-/5. elbow flex 2-/5. ext 3/5. hand 3/5.  LUE Coordination: Deficits LUE Coordination Deficits: decrased functional use Right Lower Extremity Assessment RLE ROM/Strength/Tone: Deficits (femoral line) Left Lower Extremity Assessment LLE ROM/Strength/Tone: Deficits     Mobility Bed Mobility Details for Bed Mobility Assistance: only slid up in bed due to precautions for a line Transfers Transfers: Not assessed     Exercise Other Exercises Other Exercises: TAUGHT WIFE R HAND/WRIST rom Other Exercises: edema control/retrograde massage - wife taught   Balance     End of Session OT - End of Session Activity Tolerance:  Patient limited by fatigue;Treatment limited secondary to medical complications (Comment) (femoral line) Patient left: in bed;with call bell/phone within reach Nurse Communication: Mobility status;Other (comment) (fem line to be D/Ced later today)  GO     Myrth Dahan,HILLARY 03/19/2012, 11:59 AM Luisa Dago, OTR/L  [phone] 03/19/2012

## 2012-03-19 NOTE — Progress Notes (Signed)
Right Femoral A-Line D/C'd per order. Catheter was intact upon removal. Pt. Clotted nicely, with no significant bleeding. Good 2+ right pedal pulse noted post procedure. Pt. Tolerated procedure well.

## 2012-03-20 ENCOUNTER — Encounter (HOSPITAL_COMMUNITY)
Admission: EM | Disposition: A | Discharge status: Rehabilitation Facility | Payer: Self-pay | Source: Home / Self Care | Attending: Pulmonary Disease

## 2012-03-20 ENCOUNTER — Encounter (HOSPITAL_COMMUNITY): Payer: Self-pay | Admitting: Anesthesiology

## 2012-03-20 ENCOUNTER — Inpatient Hospital Stay (HOSPITAL_COMMUNITY): Payer: BC Managed Care – PPO | Admitting: Anesthesiology

## 2012-03-20 ENCOUNTER — Inpatient Hospital Stay (HOSPITAL_COMMUNITY): Payer: BC Managed Care – PPO

## 2012-03-20 ENCOUNTER — Other Ambulatory Visit: Payer: Self-pay | Admitting: Urology

## 2012-03-20 HISTORY — PX: CYSTOSCOPY W/ URETERAL STENT PLACEMENT: SHX1429

## 2012-03-20 LAB — CBC
MCH: 32.2 pg (ref 26.0–34.0)
MCHC: 30.5 g/dL (ref 30.0–36.0)
Platelets: 38 10*3/uL — ABNORMAL LOW (ref 150–400)
RBC: 3.14 MIL/uL — ABNORMAL LOW (ref 4.22–5.81)
RDW: 22.7 % — ABNORMAL HIGH (ref 11.5–15.5)

## 2012-03-20 LAB — COMPREHENSIVE METABOLIC PANEL
AST: 193 U/L — ABNORMAL HIGH (ref 0–37)
CO2: 18 mEq/L — ABNORMAL LOW (ref 19–32)
Chloride: 123 mEq/L — ABNORMAL HIGH (ref 96–112)
Creatinine, Ser: 2.06 mg/dL — ABNORMAL HIGH (ref 0.50–1.35)
GFR calc non Af Amer: 35 mL/min — ABNORMAL LOW (ref >90)
Total Bilirubin: 14.9 mg/dL — ABNORMAL HIGH (ref 0.3–1.2)

## 2012-03-20 LAB — GLUCOSE, CAPILLARY
Glucose-Capillary: 101 mg/dL — ABNORMAL HIGH (ref 70–99)
Glucose-Capillary: 118 mg/dL — ABNORMAL HIGH (ref 70–99)
Glucose-Capillary: 226 mg/dL — ABNORMAL HIGH (ref 70–99)
Glucose-Capillary: 95 mg/dL (ref 70–99)

## 2012-03-20 LAB — MAGNESIUM: Magnesium: 2.5 mg/dL (ref 1.5–2.5)

## 2012-03-20 SURGERY — CYSTOSCOPY, WITH RETROGRADE PYELOGRAM AND URETERAL STENT INSERTION
Anesthesia: General | Laterality: Bilateral | Wound class: Contaminated

## 2012-03-20 MED ORDER — ROCURONIUM BROMIDE 100 MG/10ML IV SOLN
INTRAVENOUS | Status: DC | PRN
  Administered 2012-03-20: 10 mg via INTRAVENOUS

## 2012-03-20 MED ORDER — HYDROMORPHONE HCL PF 1 MG/ML IJ SOLN: 0.2500 mg | INTRAMUSCULAR | Status: DC | PRN

## 2012-03-20 MED ORDER — CIPROFLOXACIN IN D5W 400 MG/200ML IV SOLN
400.0000 mg | Freq: Once | INTRAVENOUS | Status: AC
  Administered 2012-03-20: 400 mg via INTRAVENOUS
  Filled 2012-03-20 (×2): qty 200

## 2012-03-20 MED ORDER — INSULIN GLARGINE 100 UNIT/ML ~~LOC~~ SOLN
45.0000 [IU] | Freq: Every day | SUBCUTANEOUS | Status: DC
  Administered 2012-03-20 – 2012-03-21 (×2): 45 [IU] via SUBCUTANEOUS

## 2012-03-20 MED ORDER — LIDOCAINE HCL (CARDIAC) 20 MG/ML IV SOLN
INTRAVENOUS | Status: DC | PRN
  Administered 2012-03-20: 80 mg via INTRAVENOUS

## 2012-03-20 MED ORDER — EPHEDRINE SULFATE 50 MG/ML IJ SOLN
INTRAMUSCULAR | Status: DC | PRN
  Administered 2012-03-20: 10 mg via INTRAVENOUS

## 2012-03-20 MED ORDER — PHENYLEPHRINE HCL 10 MG/ML IJ SOLN
INTRAMUSCULAR | Status: DC | PRN
  Administered 2012-03-20: 80 ug via INTRAVENOUS

## 2012-03-20 MED ORDER — IOHEXOL 300 MG/ML  SOLN
INTRAMUSCULAR | Status: DC | PRN
  Administered 2012-03-20: 17 mL via INTRAVENOUS

## 2012-03-20 MED ORDER — SUCCINYLCHOLINE CHLORIDE 20 MG/ML IJ SOLN
INTRAMUSCULAR | Status: DC | PRN
  Administered 2012-03-20: 100 mg via INTRAVENOUS

## 2012-03-20 MED ORDER — PROPOFOL 10 MG/ML IV BOLUS
INTRAVENOUS | Status: DC | PRN
  Administered 2012-03-20: 80 mg via INTRAVENOUS

## 2012-03-20 MED ORDER — SODIUM BICARBONATE 650 MG PO TABS
1300.0000 mg | ORAL_TABLET | Freq: Three times a day (TID) | ORAL | Status: DC
  Administered 2012-03-20 – 2012-03-21 (×5): 1300 mg via ORAL
  Filled 2012-03-20 (×9): qty 2

## 2012-03-20 MED ORDER — FENTANYL CITRATE 0.05 MG/ML IJ SOLN
INTRAMUSCULAR | Status: DC | PRN
  Administered 2012-03-20: 50 ug via INTRAVENOUS

## 2012-03-20 MED ORDER — STERILE WATER FOR IRRIGATION IR SOLN
Status: DC | PRN
  Administered 2012-03-20: 1000 mL

## 2012-03-20 MED ORDER — ONDANSETRON HCL 4 MG/2ML IJ SOLN: 4.0000 mg | Freq: Once | INTRAMUSCULAR | Status: AC | PRN

## 2012-03-20 MED ORDER — SODIUM CHLORIDE 0.9 % IR SOLN
Status: DC | PRN
  Administered 2012-03-20: 1000 mL

## 2012-03-20 SURGICAL SUPPLY — 30 items
ADAPTER CATH URET PLST 4-6FR (CATHETERS) ×2 IMPLANT
BAG URINE DRAINAGE (UROLOGICAL SUPPLIES) ×2 IMPLANT
BENZOIN TINCTURE PRP APPL 2/3 (GAUZE/BANDAGES/DRESSINGS) IMPLANT
BLADE SURG ROTATE 9660 (MISCELLANEOUS) IMPLANT
BUCKET BIOHAZARD WASTE 5 GAL (MISCELLANEOUS) ×2 IMPLANT
CATH FOLEY 2WAY SLVR  5CC 16FR (CATHETERS) ×1
CATH FOLEY 2WAY SLVR 5CC 16FR (CATHETERS) ×1 IMPLANT
CATH URET 5FR 28IN CONE TIP (BALLOONS)
CATH URET 5FR 28IN OPEN ENDED (CATHETERS) ×2 IMPLANT
CATH URET 5FR 70CM CONE TIP (BALLOONS) IMPLANT
CLOTH BEACON ORANGE TIMEOUT ST (SAFETY) ×2 IMPLANT
COVER SURGICAL LIGHT HANDLE (MISCELLANEOUS) ×2 IMPLANT
DRAPE CAMERA CLOSED 9X96 (DRAPES) ×4 IMPLANT
GLOVE BIO SURGEON STRL SZ7.5 (GLOVE) ×2 IMPLANT
GOWN PREVENTION PLUS XLARGE (GOWN DISPOSABLE) ×4 IMPLANT
GUIDEWIRE COOK  .035 (WIRE) IMPLANT
GUIDEWIRE STR DUAL SENSOR (WIRE) IMPLANT
H R LUBE JELLY XXX (MISCELLANEOUS) ×2 IMPLANT
KIT ROOM TURNOVER OR (KITS) ×2 IMPLANT
NS IRRIG 1000ML POUR BTL (IV SOLUTION) ×2 IMPLANT
PACK CYSTOSCOPY (CUSTOM PROCEDURE TRAY) ×2 IMPLANT
PAD ARMBOARD 7.5X6 YLW CONV (MISCELLANEOUS) ×4 IMPLANT
PLUG CATH AND CAP STER (CATHETERS) IMPLANT
STENT URET 6FRX24 CONTOUR (STENTS) IMPLANT
SYR 20CC LL (SYRINGE) ×2 IMPLANT
SYR CONTROL 10ML LL (SYRINGE) ×2 IMPLANT
SYRINGE TOOMEY DISP (SYRINGE) IMPLANT
UNDERPAD 30X30 INCONTINENT (UNDERPADS AND DIAPERS) ×2 IMPLANT
WATER STERILE IRR 1000ML POUR (IV SOLUTION) ×2 IMPLANT
WIRE COONS/BENSON .038X145CM (WIRE) IMPLANT

## 2012-03-20 NOTE — Anesthesia Procedure Notes (Signed)
Procedure Name: Intubation Date/Time: 03/20/2012 5:02 PM Performed by: Maeola Harman Pre-anesthesia Checklist: Patient identified, Emergency Drugs available, Suction available, Patient being monitored and Timeout performed Patient Re-evaluated:Patient Re-evaluated prior to inductionOxygen Delivery Method: Circle system utilized Preoxygenation: Pre-oxygenation with 100% oxygen Intubation Type: IV induction, Cricoid Pressure applied and Rapid sequence Laryngoscope Size: Mac and 4 Grade View: Grade I Tube type: Oral Tube size: 7.5 mm Number of attempts: 1 Airway Equipment and Method: Stylet Placement Confirmation: ETT inserted through vocal cords under direct vision,  positive ETCO2 and breath sounds checked- equal and bilateral Secured at: 23 cm Tube secured with: Tape Dental Injury: Teeth and Oropharynx as per pre-operative assessment

## 2012-03-20 NOTE — Progress Notes (Signed)
Subjective: Interval History: none.  Objective: Vital signs in last 24 hours: Temp:  [96.1 F (35.6 C)-97.5 F (36.4 C)] 97.5 F (36.4 C) (02/25 0322) Pulse Rate:  [77-103] 101 (02/25 0600) Resp:  [9-19] 17 (02/25 0600) BP: (93-131)/(42-70) 107/68 mmHg (02/25 0600) SpO2:  [98 %-100 %] 100 % (02/25 0600) Arterial Line BP: (98-117)/(37-65) 101/43 mmHg (02/24 1300) Weight change:   Intake/Output from previous day: 02/24 0701 - 02/25 0700 In: 5510 [I.V.:4210; NG/GT:1200; IV Piggyback:100] Out: 6490 [Urine:2260; Drains:2930; Stool:1300] Intake/Output this shift:    General appearance: slowed mentation and jaundiced Resp: diminished breath sounds bilaterally and rales bibasilar Cardio: S1, S2 normal and systolic murmur: holosystolic 2/6, blowing at apex GI: pos bs, liver down 5 cm, wound, ostomy on L, drain Extremities: edema 3+  Lab Results:  Recent Labs  03/19/12 2230 03/20/12 0447  WBC 9.6 10.0  HGB 9.7* 10.1*  HCT 31.3* 33.1*  PLT 35* 38*   BMET:  Recent Labs  03/19/12 2230 03/20/12 0447  NA 153* 152*  K 4.4 4.2  CL 125* 123*  CO2 18* 18*  GLUCOSE 242* 202*  BUN 126* 116*  CREATININE 2.10* 2.06*  CALCIUM 7.6* 7.9*   No results found for this basename: PTH,  in the last 72 hours Iron Studies:  Recent Labs  03/19/12 0753  IRON 54  TIBC 121*    Studies/Results: Dg Chest Port 1 View  03/19/2012  *RADIOLOGY REPORT*  Clinical Data: Assess collapse.  PORTABLE CHEST - 1 VIEW  Comparison: 03/15/2012  Findings: Interval removal of the endotracheal tube.  Right and left central venous catheters and enteric tubes remain in place. Normal heart size and pulmonary vascularity.  There appears to be developing left pleural effusion with atelectasis or infiltration in the left lung base.  Minimal residual atelectasis in the right lung base is stable.  No pneumothorax.  IMPRESSION: Developing small left pleural effusion with infiltration or atelectasis in the left lung  base.  Mild atelectasis in the right lung base is stable.  Appliances appear to be in satisfactory location.   Original Report Authenticated By: Lucienne Capers, M.D.     I have reviewed the patient's current medications.  Assessment/Plan: 1 AKi slow trend.  Acidemia, GI and renal, needs bicarb.  Can slow IVF and allow to slow diurese some of xs fluids. 2 Anemia stable 3 UC 4 ^LFTs ?? Cause, need to consider Sclerosing Cholangitis and acute causes 5 Low Ptlt Cause not clear 6  P bicarb,lower ivf, Abdm eval     LOS: 23 days   Connor Small L 03/20/2012,8:34 AM

## 2012-03-20 NOTE — Consult Note (Signed)
Urology Consult   Physician requesting consult: Titus Mould  Reason for consult: Eval for possible ureteral injury  History of Present Illness: Connor Small is a 52 y.o. cauc male with PMH significant for ulcerative colitis and diverticulitis who was admitted to [facility] on 02/26/12 for CDiff.  He underwent a decompressive cecostomy on 2/3 and then due to worsening sx underwent a total colectomy with end ileostomy formation on 2/4.  Post operatively he developed ARF, ARDS, septic shock, and new onset afib.  He was then transferred to [facility] on 03/01/12. All of his medical issues are being aggressively treated by general surgery, IM, ID, and pulmonology.  His abdominal wound has dehisced and there is a wound vac in place.  There are two JP drains in place both of which have continued to have large amounts of output.  Due to the persistent large volume output from these drains there is suspicion of ureteral injury.  JP drainage was checked for Cr which was found to be 2.9.  His serum Cr has ranged from 2.06-2.63, therefore, the drainage value is not a clear indication of urine leak.  He was also given Indigo carmine to monitor for color change in drainage.  This revealed a slight color change in drainage from clear/yellow to greenish and quickly back to clear/yellow.  Again, not a clear indication of leak.  Drainage is currently yellow/clear.  He has a foley in place with good urine output.    He currently states he feels "ok".  He denies F/C, CP, SOB, and N/V.  He states his pain is well controlled.  Past Medical History  Diagnosis Date  . Chronic diarrhea   . Rectal bleed   . Hemorrhoids   . Ulcerative colitis     Distal UC over 8 yrs ago diagnosed  . Diverticulitis of large intestine with perforation 10/2011    done at [place]    Past Surgical History  Procedure Laterality Date  . Colonoscopy  9/03  . Sigmoidoscopy       06/13/2002  . Temporary ostomy November 06, 2010      for a colon  perforation that was done in [facility] (Dr Payton Doughty).    . Colonoscopy  02/16/2011    Procedure: COLONOSCOPY;  Surgeon: Rogene Houston, MD;  Location: [facility];  Service: Endoscopy;  Laterality: N/A;  100  . Flexible sigmoidoscopy  02/27/2012    Procedure: FLEXIBLE SIGMOIDOSCOPY;  Surgeon: Rogene Houston, MD;  Location: [facility];  Service: Endoscopy;  Laterality: N/A;  with colonic decompression  . Laparotomy  02/27/2012    Procedure: EXPLORATORY LAPAROTOMY;  Surgeon: Donato Heinz, MD;  Location: [facility] ORS;  Service: General;  Laterality: N/A;  . Cecostomy  02/27/2012    Procedure: CECOSTOMY;  Surgeon: Donato Heinz, MD;  Location: [facility] ORS;  Service: General;  Laterality: N/A;  Cecostomy Tube Placement  . Colectomy  02/28/2012    Procedure: TOTAL COLECTOMY;  Surgeon: Donato Heinz, MD;  Location: [facility] ORS;  Service: General;  Laterality: N/A;  . Ileostomy  02/28/2012    Procedure: ILEOSTOMY;  Surgeon: Donato Heinz, MD;  Location: [facility] ORS;  Service: General;  Laterality: N/A;    Current Hospital Medications:  Home Meds: Prior to Admission medications   Medication Sig Start Date End Date Taking? Authorizing Provider  bifidobacterium infantis (ALIGN) capsule Take 1 capsule by mouth daily. 07/18/11 07/17/12 Yes Rogene Houston, MD  FORTESTA 10 MG/ACT (2%) GEL at bedtime.  Patient states that he applies it to his leg 12/05/11  Yes Historical Provider, MD  ibuprofen (ADVIL,MOTRIN) 200 MG tablet Take 400 mg by mouth 2 (two) times daily as needed. For pain   Yes Historical Provider, MD  Mesalamine (ASACOL HD) 800 MG TBEC Take 2 tablets (1,600 mg total) by mouth 2 (two) times daily. 01/16/12  Yes Rogene Houston, MD  Pseudoeph-Doxylamine-DM-APAP (NYQUIL D COLD/FLU) 60-12.06-22-998 MG/30ML LIQD Take 30 mLs by mouth at bedtime. Cold/flu symptoms   Yes Historical Provider, MD  Pseudoephedrine-APAP-DM (DAYQUIL MULTI-SYMPTOM COLD/FLU PO) Take 1 capsule by mouth every 4 (four) hours as needed. Cold  symptoms   Yes Historical Provider, MD     Scheduled Meds: . antiseptic oral rinse  15 mL Mouth Rinse q12n4p  . chlorhexidine  15 mL Mouth Rinse BID  . feeding supplement  30 mL Per Tube TID  . free water  200 mL Per Tube QID  . hydrocortisone sodium succinate  50 mg Intravenous Q6H  . insulin aspart  0-20 Units Subcutaneous Q4H  . insulin glargine  45 Units Subcutaneous Daily  . lactulose  30 g Per Tube BID  . ranitidine  150 mg Per Tube BID  . sodium bicarbonate  1,300 mg Oral TID  . sodium chloride  10-40 mL Intracatheter Q12H   Continuous Infusions: . sodium chloride Stopped (03/13/12 0800)  . sodium chloride 20 mL/hr at 03/17/12 0433  . dextrose 5 % and 0.45 % NaCl with KCl 20 mEq/L 150 mL/hr at 03/20/12 0915  . feeding supplement (VITAL 1.5 CAL) 1,000 mL (03/19/12 1629)   PRN Meds:.anticoagulant sodium citrate, ondansetron (ZOFRAN) IV, sodium chloride  Allergies:  Allergies  Allergen Reactions  . Penicillins Other (See Comments)    Heart rate changes    Family History  Problem Relation Age of Onset  . Cancer Sister   . Healthy Daughter   . Hypertension Mother     Social History:  reports that he quit smoking about 27 years ago. His smoking use included Cigarettes. He smoked 0.00 packs per day. He has never used smokeless tobacco. He reports that he does not drink alcohol or use illicit drugs.  ROS: A complete review of systems was performed.  All systems are negative except for pertinent findings as noted.  Physical Exam:  Vital signs in last 24 hours: Temp:  [96.6 F (35.9 C)-97.5 F (36.4 C)] 97.4 F (36.3 C) (02/25 0843) Pulse Rate:  [77-103] 101 (02/25 0900) Resp:  [9-17] 15 (02/25 0900) BP: (93-116)/(42-70) 94/58 mmHg (02/25 0900) SpO2:  [98 %-100 %] 100 % (02/25 0900) Arterial Line BP: (98-102)/(41-43) 101/43 mmHg (02/24 1300) General:  Alert and oriented, No acute distress.  Slow mentation. Skin with slight yellow discoloration HEENT:  Normocephalic, atraumatic.  Sclera yellow bilateral.   Neck: No JVD or lymphadenopathy Cardiovascular: Regular rate and rhythm Lungs: decreased bases Abdomen: abd binder in place over wound vac.  R quad ostomy with good output; JPs in place L quad with yellow/clear output Extremities: 1+ edema bilateral; blanching rash bilateral thighs Neurologic: Grossly intact; weakness all ext.  Laboratory Data:   Recent Labs  03/18/12 0503 03/19/12 0440 03/19/12 2230 03/20/12 0447  WBC 12.0* 10.8* 9.6 10.0  HGB 10.2* 9.2* 9.7* 10.1*  HCT 31.9* 30.3* 31.3* 33.1*  PLT 35* 36* 35* 38*     Recent Labs  03/18/12 1600 03/19/12 0440 03/19/12 1600 03/19/12 2230 03/20/12 0447  NA 154* 153* 152* 153* 152*  K 3.2* 3.4* 4.6 4.4 4.2  CL 121* 123* 124* 125* 123*  GLUCOSE 190* 162* 315* 242* 202*  BUN 145* 138* 126* 126* 116*  CALCIUM 8.0* 7.8* 7.3* 7.6* 7.9*  CREATININE 2.51* 2.37* 2.12* 2.10* 2.06*     Results for orders placed during the hospital encounter of 02/26/12 (from the past 24 hour(s))  GLUCOSE, CAPILLARY     Status: Abnormal   Collection Time    03/19/12 11:46 AM      Result Value Range   Glucose-Capillary 196 (*) 70 - 99 mg/dL  GLUCOSE, CAPILLARY     Status: Abnormal   Collection Time    03/19/12  3:34 PM      Result Value Range   Glucose-Capillary 204 (*) 70 - 99 mg/dL  BASIC METABOLIC PANEL     Status: Abnormal   Collection Time    03/19/12  4:00 PM      Result Value Range   Sodium 152 (*) 135 - 145 mEq/L   Potassium 4.6  3.5 - 5.1 mEq/L   Chloride 124 (*) 96 - 112 mEq/L   CO2 18 (*) 19 - 32 mEq/L   Glucose, Bld 315 (*) 70 - 99 mg/dL   BUN 126 (*) 6 - 23 mg/dL   Creatinine, Ser 2.12 (*) 0.50 - 1.35 mg/dL   Calcium 7.3 (*) 8.4 - 10.5 mg/dL   GFR calc non Af Amer 34 (*) >90 mL/min   GFR calc Af Amer 39 (*) >90 mL/min  GLUCOSE, CAPILLARY     Status: Abnormal   Collection Time    03/19/12  7:45 PM      Result Value Range   Glucose-Capillary 193 (*) 70 - 99 mg/dL   CBC     Status: Abnormal   Collection Time    03/19/12 10:30 PM      Result Value Range   WBC 9.6  4.0 - 10.5 K/uL   RBC 2.99 (*) 4.22 - 5.81 MIL/uL   Hemoglobin 9.7 (*) 13.0 - 17.0 g/dL   HCT 31.3 (*) 39.0 - 52.0 %   MCV 104.7 (*) 78.0 - 100.0 fL   MCH 32.4  26.0 - 34.0 pg   MCHC 31.0  30.0 - 36.0 g/dL   RDW 22.9 (*) 11.5 - 15.5 %   Platelets 35 (*) 150 - 400 K/uL  BASIC METABOLIC PANEL     Status: Abnormal   Collection Time    03/19/12 10:30 PM      Result Value Range   Sodium 153 (*) 135 - 145 mEq/L   Potassium 4.4  3.5 - 5.1 mEq/L   Chloride 125 (*) 96 - 112 mEq/L   CO2 18 (*) 19 - 32 mEq/L   Glucose, Bld 242 (*) 70 - 99 mg/dL   BUN 126 (*) 6 - 23 mg/dL   Creatinine, Ser 2.10 (*) 0.50 - 1.35 mg/dL   Calcium 7.6 (*) 8.4 - 10.5 mg/dL   GFR calc non Af Amer 34 (*) >90 mL/min   GFR calc Af Amer 40 (*) >90 mL/min  MAGNESIUM     Status: None   Collection Time    03/19/12 10:30 PM      Result Value Range   Magnesium 2.5  1.5 - 2.5 mg/dL  PHOSPHORUS     Status: None   Collection Time    03/19/12 10:30 PM      Result Value Range   Phosphorus 3.9  2.3 - 4.6 mg/dL  BLOOD GAS, VENOUS     Status: Abnormal  Collection Time    03/19/12 10:33 PM      Result Value Range   FIO2 0.21     pH, Ven 7.341 (*) 7.250 - 7.300   pCO2, Ven 32.3 (*) 45.0 - 50.0 mmHg   pO2, Ven 38.4  30.0 - 45.0 mmHg   Bicarbonate 17.0 (*) 20.0 - 24.0 mEq/L   TCO2 18.0  0 - 100 mmol/L   Acid-base deficit 7.7 (*) 0.0 - 2.0 mmol/L   O2 Saturation 66.7     Patient temperature 98.6    GLUCOSE, CAPILLARY     Status: Abnormal   Collection Time    03/20/12 12:00 AM      Result Value Range   Glucose-Capillary 226 (*) 70 - 99 mg/dL  GLUCOSE, CAPILLARY     Status: Abnormal   Collection Time    03/20/12  3:21 AM      Result Value Range   Glucose-Capillary 191 (*) 70 - 99 mg/dL   Comment 1 Documented in Chart     Comment 2 Notify RN    PROTIME-INR     Status: Abnormal   Collection Time    03/20/12  4:47  AM      Result Value Range   Prothrombin Time 19.7 (*) 11.6 - 15.2 seconds   INR 1.73 (*) 0.00 - 1.49  PHOSPHORUS     Status: None   Collection Time    03/20/12  4:47 AM      Result Value Range   Phosphorus 3.6  2.3 - 4.6 mg/dL  MAGNESIUM     Status: None   Collection Time    03/20/12  4:47 AM      Result Value Range   Magnesium 2.5  1.5 - 2.5 mg/dL  COMPREHENSIVE METABOLIC PANEL     Status: Abnormal   Collection Time    03/20/12  4:47 AM      Result Value Range   Sodium 152 (*) 135 - 145 mEq/L   Potassium 4.2  3.5 - 5.1 mEq/L   Chloride 123 (*) 96 - 112 mEq/L   CO2 18 (*) 19 - 32 mEq/L   Glucose, Bld 202 (*) 70 - 99 mg/dL   BUN 116 (*) 6 - 23 mg/dL   Creatinine, Ser 2.06 (*) 0.50 - 1.35 mg/dL   Calcium 7.9 (*) 8.4 - 10.5 mg/dL   Total Protein 3.8 (*) 6.0 - 8.3 g/dL   Albumin 1.9 (*) 3.5 - 5.2 g/dL   AST 193 (*) 0 - 37 U/L   ALT 101 (*) 0 - 53 U/L   Alkaline Phosphatase 592 (*) 39 - 117 U/L   Total Bilirubin 14.9 (*) 0.3 - 1.2 mg/dL   GFR calc non Af Amer 35 (*) >90 mL/min   GFR calc Af Amer 41 (*) >90 mL/min  CBC     Status: Abnormal   Collection Time    03/20/12  4:47 AM      Result Value Range   WBC 10.0  4.0 - 10.5 K/uL   RBC 3.14 (*) 4.22 - 5.81 MIL/uL   Hemoglobin 10.1 (*) 13.0 - 17.0 g/dL   HCT 33.1 (*) 39.0 - 52.0 %   MCV 105.4 (*) 78.0 - 100.0 fL   MCH 32.2  26.0 - 34.0 pg   MCHC 30.5  30.0 - 36.0 g/dL   RDW 22.7 (*) 11.5 - 15.5 %   Platelets 38 (*) 150 - 400 K/uL  GLUCOSE, CAPILLARY     Status: Abnormal  Collection Time    03/20/12  8:42 AM      Result Value Range   Glucose-Capillary 179 (*) 70 - 99 mg/dL   Recent Results (from the past 240 hour(s))  CULTURE, BLOOD (ROUTINE X 2)     Status: None   Collection Time    03/10/12  5:10 PM      Result Value Range Status   Specimen Description BLOOD RIGHT ANTECUBITAL   Final   Special Requests BOTTLES DRAWN AEROBIC ONLY 5CC   Final   Culture  Setup Time 03/10/2012 21:33   Final   Culture NO GROWTH 5  DAYS   Final   Report Status 03/16/2012 FINAL   Final  CULTURE, RESPIRATORY (NON-EXPECTORATED)     Status: None   Collection Time    03/10/12  5:16 PM      Result Value Range Status   Specimen Description TRACHEAL ASPIRATE   Final   Special Requests Normal   Final   Gram Stain     Final   Value: ABUNDANT WBC PRESENT, PREDOMINANTLY PMN     RARE GRAM POSITIVE COCCI IN PAIRS   Culture Non-Pathogenic Oropharyngeal-type Flora Isolated.   Final   Report Status 03/13/2012 FINAL   Final    Renal Function:  Recent Labs  03/17/12 0420 03/18/12 0503 03/18/12 1600 03/19/12 0440 03/19/12 1600 03/19/12 2230 03/20/12 0447  CREATININE 2.63* 2.61* 2.51* 2.37* 2.12* 2.10* 2.06*   Estimated Creatinine Clearance: 41.5 ml/min (by C-G formula based on Cr of 2.06).  Radiologic Imaging: Dg Chest Port 1 View  03/19/2012  *RADIOLOGY REPORT*  Clinical Data: Assess collapse.  PORTABLE CHEST - 1 VIEW  Comparison: 03/15/2012  Findings: Interval removal of the endotracheal tube.  Right and left central venous catheters and enteric tubes remain in place. Normal heart size and pulmonary vascularity.  There appears to be developing left pleural effusion with atelectasis or infiltration in the left lung base.  Minimal residual atelectasis in the right lung base is stable.  No pneumothorax.  IMPRESSION: Developing small left pleural effusion with infiltration or atelectasis in the left lung base.  Mild atelectasis in the right lung base is stable.  Appliances appear to be in satisfactory location.   Original Report Authenticated By: Lucienne Capers, M.D.    RADIOLOGY REPORT* 03/11/12 Clinical Data: Postop colectomy. Elevated white count and LFTs.  CT ABDOMEN AND PELVIS WITHOUT CONTRAST  Technique: Multidetector CT imaging of the abdomen and pelvis was  performed following the standard protocol without intravenous  contrast.  Comparison: 03/05/2012  Findings: Moderate right and small left pleural effusions. Heart   is normal size. Compressive atelectasis in the lower lobes.  Open midline incision. Moderate amount of ascites around the  spleen. Changes of subtotal colectomy. Right lower quadrant  ileostomy noted. Liver, spleen, pancreas, adrenals and kidneys are  unremarkable. Surgical drains within the pelvis. Small bowel is  decompressed and grossly unremarkable. There appears to be mild  wall thickening involving much of the stomach, but the stomach is  decompressed and difficult to evaluate. Small hyperdense area  noted along the greater curvature of the stomach posteriorly. This  could represent a small mesenteric hematoma. No focal fluid  collections are seen to suggest abscess.  Foley catheters present in the bladder which is grossly  unremarkable. Aorta is normal caliber. No acute bony abnormality.  IMPRESSION:  Changes of colectomy. Moderate fluid noted adjacent to the spleen  in the left upper quadrant. This presumably represents loculated  ascites rather  than subcapsular fluid collection.  Apparent diffuse gastric wall thickening, but the stomach is  decompressed and difficult to evaluate. Small hyperdense rounded  collection posterior to the greater curvature of the stomach,  possibly small adjacent postoperative hematoma.  Moderate right pleural effusion and small left pleural effusion.  Original Report Authenticated By: Rolm Baptise, M.D.   Impression/Assessment:  Pt S/p colectomy with ileostomy formation with multiple post op complications/medical issues.  He continues to have large volume output from his JP drains creating suspicion for ureteral injury. Non-invasive tests have proven inconclusive.  Plan:  NPO  Will schedule for cysto with retrogrades and possible stenting this afternoon with Dr. Tresa Moore.  Dr. Tresa Moore will meet with pt and wife prior to procedure.  I have explained the procedure and pt and wife agree to proceed.    Marcie Bal 03/20/2012, 11:06 AM    I have  evaluated patient and agree with concerns above. Picture somewhat but not highly concerning for urine leak as JP Cr modestly elevated over serum.  However, his drain output is impressive and does appear in direct apposition to the rt mid and distal ureter by recent CT. Pt not candidate for IV contrast CT given his renal insufficiency. I feel the next best diagnostic and potentially therapeutic maneuver would be cysto and bilateral retrograde pyleogram in OR under conscious sedation (or per Anesthesia) to definitively confirm or refute ureteral injury. If a non circumferential injury with extravasation is noted we will proceed with concomitant stenting. If severe injury found, he would likely be best managed with ipsilateral nephrostomy with delayed repair in several mos.   We discussed risks including bleeding, infection, damage to kidney / ureter  bladder, rarely loss of kidney. We discussed anesthetic risks and rare but serious surgical complications including DVT, PE, MI, and mortality.

## 2012-03-20 NOTE — Progress Notes (Signed)
Patient ID: Connor Small, male   DOB: [birth year], 52 y.o.   MRN: [id number] 21 Days Post-Op  Subjective: Pt is awake.  No c/o this morning.  Methylene blue given yesterday and some did exit in his abdominal drains, possibly c/w some type of urologic injury.  Tolerating TFs.  Continuing to work with speech therapy.  Hope for repeat swallow tomorrow.  Objective: Vital signs in last 24 hours: Temp:  [96.6 F (35.9 C)-97.5 F (36.4 C)] 97.4 F (36.3 C) (02/25 0843) Pulse Rate:  [77-103] 102 (02/25 1100) Resp:  [9-17] 17 (02/25 1100) BP: (90-116)/(42-70) 90/55 mmHg (02/25 1100) SpO2:  [98 %-100 %] 99 % (02/25 1100) Arterial Line BP: (98-102)/(41-43) 101/43 mmHg (02/24 1300) Last BM Date: 03/20/12  Intake/Output from previous day: 02/24 0701 - 02/25 0700 In: 5760 [I.V.:4410; NG/GT:1250; IV Piggyback:100] Out: 6490 [Urine:2260; Drains:2930; Stool:1300] Intake/Output this shift: Total I/O In: 700 [I.V.:400; NG/GT:300] Out: 730 [Urine:240; Drains:415; Stool:75]  PE: Abd: soft, JPs full with serous output, VAC in place with serous output as well.  Ostomy in place with copious bilious output, stoma is viable.  NG in place with TFs  Lab Results:   Recent Labs  03/19/12 2230 03/20/12 0447  WBC 9.6 10.0  HGB 9.7* 10.1*  HCT 31.3* 33.1*  PLT 35* 38*   BMET  Recent Labs  03/19/12 2230 03/20/12 0447  NA 153* 152*  K 4.4 4.2  CL 125* 123*  CO2 18* 18*  GLUCOSE 242* 202*  BUN 126* 116*  CREATININE 2.10* 2.06*  CALCIUM 7.6* 7.9*   PT/INR  Recent Labs  03/19/12 0440 03/20/12 0447  LABPROT 21.2* 19.7*  INR 1.92* 1.73*   CMP     Component Value Date/Time   NA 152* 03/20/2012 0447   K 4.2 03/20/2012 0447   CL 123* 03/20/2012 0447   CO2 18* 03/20/2012 0447   GLUCOSE 202* 03/20/2012 0447   BUN 116* 03/20/2012 0447   CREATININE 2.06* 03/20/2012 0447   CALCIUM 7.9* 03/20/2012 0447   PROT 3.8* 03/20/2012 0447   ALBUMIN 1.9* 03/20/2012 0447   AST 193* 03/20/2012 0447   ALT 101*  03/20/2012 0447   ALKPHOS 592* 03/20/2012 0447   BILITOT 14.9* 03/20/2012 0447   GFRNONAA 35* 03/20/2012 0447   GFRAA 41* 03/20/2012 0447   Lipase     Component Value Date/Time   LIPASE 9* 02/26/2012 1409       Studies/Results: Dg Chest Port 1 View  03/19/2012  *RADIOLOGY REPORT*  Clinical Data: Assess collapse.  PORTABLE CHEST - 1 VIEW  Comparison: 03/15/2012  Findings: Interval removal of the endotracheal tube.  Right and left central venous catheters and enteric tubes remain in place. Normal heart size and pulmonary vascularity.  There appears to be developing left pleural effusion with atelectasis or infiltration in the left lung base.  Minimal residual atelectasis in the right lung base is stable.  No pneumothorax.  IMPRESSION: Developing small left pleural effusion with infiltration or atelectasis in the left lung base.  Mild atelectasis in the right lung base is stable.  Appliances appear to be in satisfactory location.   Original Report Authenticated By: Burman Nieves, M.D.     Anti-infectives: Anti-infectives   Start     Dose/Rate Route Frequency Ordered Stop   03/18/12 1000  vancomycin (VANCOCIN) 1,500 mg in sodium chloride 0.9 % 500 mL IVPB  Status:  Discontinued     1,500 mg 250 mL/hr over 120 Minutes Intravenous Every 48 hours 03/17/12 1920 03/19/12  0840   03/14/12 1000  micafungin (MYCAMINE) 100 mg in sodium chloride 0.9 % 100 mL IVPB  Status:  Discontinued     100 mg 100 mL/hr over 1 Hours Intravenous Daily 03/13/12 0632 03/19/12 0840   03/13/12 1000  micafungin (MYCAMINE) 100 mg in sodium chloride 0.9 % 100 mL IVPB     100 mg 100 mL/hr over 1 Hours Intravenous Daily 03/13/12 0632 03/13/12 1311   03/12/12 1800  vancomycin (VANCOCIN) IVPB 1000 mg/200 mL premix  Status:  Discontinued     1,000 mg 200 mL/hr over 60 Minutes Intravenous Every 24 hours 03/12/12 0859 03/17/12 1017   03/12/12 1000  imipenem-cilastatin (PRIMAXIN) 500 mg in sodium chloride 0.9 % 100 mL IVPB   Status:  Discontinued     500 mg 200 mL/hr over 30 Minutes Intravenous 3 times per day 03/12/12 0848 03/12/12 0909   03/12/12 1000  imipenem-cilastatin (PRIMAXIN) 250 mg in sodium chloride 0.9 % 100 mL IVPB  Status:  Discontinued     250 mg 200 mL/hr over 30 Minutes Intravenous 4 times per day 03/12/12 0909 03/19/12 0841   03/11/12 1200  vancomycin (VANCOCIN) 750 mg in sodium chloride 0.9 % 150 mL IVPB  Status:  Discontinued     750 mg 150 mL/hr over 60 Minutes Intravenous Every 12 hours 03/11/12 0204 03/12/12 0858   03/11/12 0215  vancomycin (VANCOCIN) 750 mg in sodium chloride 0.9 % 150 mL IVPB     750 mg 150 mL/hr over 60 Minutes Intravenous  Once 03/11/12 0204 03/11/12 0352   03/11/12 0215  aztreonam (AZACTAM) 1 g in dextrose 5 % 50 mL IVPB  Status:  Discontinued     1 g 100 mL/hr over 30 Minutes Intravenous 3 times per day 03/11/12 0204 03/12/12 0851   03/05/12 2200  metroNIDAZOLE (FLAGYL) IVPB 500 mg  Status:  Discontinued    Comments:  First dose ASAP   500 mg 100 mL/hr over 60 Minutes Intravenous Every 8 hours 03/05/12 1346 03/19/12 1509   03/02/12 1200  vancomycin (VANCOCIN) 50 mg/mL oral solution 500 mg  Status:  Discontinued     500 mg Oral 4 times per day 03/02/12 0913 03/19/12 1509   03/01/12 1445  vancomycin (VANCOCIN) 50 mg/mL oral solution 500 mg  Status:  Discontinued     500 mg Oral 3 times per day 03/01/12 1340 03/02/12 0913   03/01/12 1400  aztreonam (AZACTAM) 1 g in dextrose 5 % 50 mL IVPB  Status:  Discontinued     1 g 100 mL/hr over 30 Minutes Intravenous 3 times per day 03/01/12 1356 03/02/12 1146   03/01/12 1300  vancomycin (VANCOCIN) 1,250 mg in sodium chloride 0.9 % 250 mL IVPB  Status:  Discontinued     1,250 mg 166.7 mL/hr over 90 Minutes Intravenous Every 12 hours 03/01/12 1127 03/02/12 1146   02/29/12 0600  ciprofloxacin (CIPRO) IVPB 400 mg     400 mg 200 mL/hr over 60 Minutes Intravenous On call to O.R. 02/28/12 1409 02/28/12 1438   02/28/12 2200   vancomycin (VANCOCIN) 500 mg in sodium chloride irrigation 0.9 % 60 mL ENEMA  Status:  Discontinued     500 mg Rectal 2 times daily 02/28/12 1504 02/28/12 1817   02/28/12 1200  vancomycin (VANCOCIN) 50 mg/mL oral solution 500 mg  Status:  Discontinued    Comments:  First dose ASAP   500 mg Per Tube 4 times per day 02/28/12 0814 02/28/12 1817   02/28/12 1000  vancomycin (VANCOCIN) IVPB 1000 mg/200 mL premix  Status:  Discontinued     1,000 mg 200 mL/hr over 60 Minutes Intravenous Every 12 hours 02/28/12 0845 03/01/12 1127   02/28/12 1000  vancomycin (VANCOCIN) 500 mg in sodium chloride irrigation 0.9 % 100 mL ENEMA  Status:  Discontinued     500 mg Rectal 2 times daily 02/28/12 0923 02/28/12 1504   02/27/12 1600  metroNIDAZOLE (FLAGYL) IVPB 500 mg  Status:  Discontinued     500 mg 100 mL/hr over 60 Minutes Intravenous Every 8 hours 02/27/12 1546 02/27/12 2000   02/27/12 1600  ciprofloxacin (CIPRO) IVPB 400 mg  Status:  Discontinued     400 mg 200 mL/hr over 60 Minutes Intravenous Every 12 hours 02/27/12 1547 02/27/12 2000   02/27/12 1545  metroNIDAZOLE (FLAGYL) IVPB 500 mg  Status:  Discontinued     500 mg 100 mL/hr over 60 Minutes Intravenous  Once 02/27/12 1535 02/28/12 0843   02/27/12 1545  vancomycin (VANCOCIN) 500 mg in sodium chloride 0.9 % 100 mL IVPB  Status:  Discontinued     500 mg 100 mL/hr over 60 Minutes Intravenous  Once 02/27/12 1535 02/27/12 1538   02/27/12 1545  ciprofloxacin (CIPRO) IVPB 400 mg  Status:  Discontinued     400 mg 200 mL/hr over 60 Minutes Intravenous  Once 02/27/12 1538 02/28/12 0843   02/27/12 1415  vancomycin (VANCOCIN) 500 mg in sodium chloride 0.9 % 100 mL IVPB     500 mg 100 mL/hr over 60 Minutes Intravenous  Once 02/27/12 1402 02/27/12 1411   02/27/12 1400  vancomycin (VANCOCIN) powder 500 mg  Status:  Discontinued     500 mg Other To Surgery 02/27/12 1359 02/27/12 1402   02/27/12 1200  metroNIDAZOLE (FLAGYL) IVPB 500 mg  Status:  Discontinued     Comments:  First dose ASAP   500 mg 100 mL/hr over 60 Minutes Intravenous Every 6 hours 02/27/12 0953 03/05/12 1346   02/27/12 1200  vancomycin (VANCOCIN) 50 mg/mL oral solution 250 mg  Status:  Discontinued    Comments:  First dose ASAP   250 mg Oral 4 times per day 02/27/12 0953 02/28/12 0814   02/26/12 1700  oseltamivir (TAMIFLU) capsule 75 mg     75 mg Oral  Once 02/26/12 1651 02/26/12 1715       Assessment/Plan  1. S/p subtotal colectomy with ileostomy for c diff colitis 2. VDRF 3. Probably urologic injury 4. PCM/TF  Plan: 1. Cont to work with speech.  Once he passes his swallow study, we can dc NGT and start oral diet. 2. Appreciate urology evaluation and assistance. 3. Abdomen currently stable. 4. Cont to follow. 5. Will look at wound with VAC change tomorrow   LOS: 23 days    Connor Small E 03/20/2012, 11:16 AM Pager: [phone]

## 2012-03-20 NOTE — Progress Notes (Signed)
Patient ID: Connor Small, male   DOB: 01-02-1961, 52 y.o.   MRN: [id number]         [facility]    Date of Admission:  02/26/2012     Principal Problem:   Leukocytosis, unspecified Active Problems:   C. difficile colitis   Septic shock   Hyperbilirubinemia   UC (ulcerative colitis)   Colonic stricture   S/P cecostomy   ARF (acute renal failure)   Sinus tachycardia   Hematuria   Acute respiratory failure   Pleural effusion   Thrombocytopenia, unspecified   Atrial fibrillation   Anemia   Quadriparesis   . antiseptic oral rinse  15 mL Mouth Rinse q12n4p  . chlorhexidine  15 mL Mouth Rinse BID  . feeding supplement  30 mL Per Tube TID  . free water  200 mL Per Tube QID  . hydrocortisone sodium succinate  50 mg Intravenous Q6H  . insulin aspart  0-20 Units Subcutaneous Q4H  . insulin glargine  45 Units Subcutaneous Daily  . lactulose  30 g Per Tube BID  . ranitidine  150 mg Per Tube BID  . sodium bicarbonate  1,300 mg Oral TID  . sodium chloride  10-40 mL Intracatheter Q12H   Objective: Temp:  [96.6 F (35.9 C)-97.8 F (36.6 C)] 97.8 F (36.6 C) (02/25 1555) Pulse Rate:  [75-103] 75 (02/25 1600) Resp:  [10-21] 10 (02/25 1600) BP: (90-116)/(53-70) 105/66 mmHg (02/25 1600) SpO2:  [98 %-100 %] 98 % (02/25 1600)  He is currently in the operating room to evaluate a possible ureteral leak His nurse reports that his rash on his abdomen and thighs is unchanged  Lab Results Lab Results  Component Value Date   WBC 10.0 03/20/2012   HGB 10.1* 03/20/2012   HCT 33.1* 03/20/2012   MCV 105.4* 03/20/2012   PLT 38* 03/20/2012    Lab Results  Component Value Date   CREATININE 2.06* 03/20/2012   BUN 116* 03/20/2012   NA 152* 03/20/2012   K 4.2 03/20/2012   CL 123* 03/20/2012   CO2 18* 03/20/2012    Lab Results  Component Value Date   ALT 101* 03/20/2012   AST 193* 03/20/2012   ALKPHOS 592* 03/20/2012   BILITOT 14.9* 03/20/2012      Microbiology: No  results found for this or any previous visit (from the past 240 hour(s)).  Studies/Results: Dg Chest Port 1 View  03/19/2012  *RADIOLOGY REPORT*  Clinical Data: Assess collapse.  PORTABLE CHEST - 1 VIEW  Comparison: 03/15/2012  Findings: Interval removal of the endotracheal tube.  Right and left central venous catheters and enteric tubes remain in place. Normal heart size and pulmonary vascularity.  There appears to be developing left pleural effusion with atelectasis or infiltration in the left lung base.  Minimal residual atelectasis in the right lung base is stable.  No pneumothorax.  IMPRESSION: Developing small left pleural effusion with infiltration or atelectasis in the left lung base.  Mild atelectasis in the right lung base is stable.  Appliances appear to be in satisfactory location.   Original Report Authenticated By: Burman Nieves, M.D.     Assessment: I do not see any evidence of active infection at this time and will continue observation off of antibiotics.  Plan: 1. Continue observation off of antibiotics 2. I will be gone tomorrow, but please call Dr. Enedina Finner [phone]) if needed before my return on Thursday, February 27  Cliffton Asters, MD [facility] [phone] pager   [phone] cell 03/20/2012, 6:03 PM

## 2012-03-20 NOTE — Procedures (Signed)
Objective Swallowing Evaluation: Fiberoptic Endoscopic Evaluation of Swallowing  Patient Details  Name: Connor Small MRN: [id number] Date of Birth: 1960-03-20  Today's Date: 03/20/2012 Time: 1000-1033 SLP Time Calculation (min): 33 min  Past Medical History:  Past Medical History  Diagnosis Date  . Chronic diarrhea   . Rectal bleed   . Hemorrhoids   . Ulcerative colitis     Distal UC over 8 yrs ago diagnosed  . Diverticulitis of large intestine with perforation 10/2011    done at [facility]   Past Surgical History:  Past Surgical History  Procedure Laterality Date  . Colonoscopy  9/03  . Sigmoidoscopy       06/13/2002  . Temporary ostomy November 06, 2010      for a colon perforation that was done in [facility] (Dr Ruben Im).    . Colonoscopy  02/16/2011    Procedure: COLONOSCOPY;  Surgeon: Malissa Hippo, MD;  Location: [facility];  Service: Endoscopy;  Laterality: N/A;  100  . Flexible sigmoidoscopy  02/27/2012    Procedure: FLEXIBLE SIGMOIDOSCOPY;  Surgeon: Malissa Hippo, MD;  Location: [facility];  Service: Endoscopy;  Laterality: N/A;  with colonic decompression  . Laparotomy  02/27/2012    Procedure: EXPLORATORY LAPAROTOMY;  Surgeon: Fabio Bering, MD;  Location: [facility] ORS;  Service: General;  Laterality: N/A;  . Cecostomy  02/27/2012    Procedure: CECOSTOMY;  Surgeon: Fabio Bering, MD;  Location: [facility] ORS;  Service: General;  Laterality: N/A;  Cecostomy Tube Placement  . Colectomy  02/28/2012    Procedure: TOTAL COLECTOMY;  Surgeon: Fabio Bering, MD;  Location: [facility] ORS;  Service: General;  Laterality: N/A;  . Ileostomy  02/28/2012    Procedure: ILEOSTOMY;  Surgeon: Fabio Bering, MD;  Location: [facility];  Service: General;  Laterality: N/A;   HPI:  73 YOM admitted to [facility] for Cdiff on 2/2. Underwent decompressive cecostomy 2/3, initially was better, then worsened on 2/4 with increased abdominal distension and tenderness, decreased renal function.  He was taken back to  the OR on 2/4 for total abdominal colectomy and end ileostomy.  Post op he developed ARDS and sepsis shock and was being treated for this.  The morning of 2/6 he had new onset of A-fib and transferred to [facility].       Assessment / Plan / Recommendation Clinical Impression  Dysphagia Diagnosis: Moderate oral phase dysphagia;Moderate pharyngeal phase dysphagia Clinical impression: Pt presents with generalized weakness and an inability to clear standing secretions in pharynx. There is thick yellow mucous standing in valleculae, pyriform sinuses and strung in ropes accross pharynx above vestibule. Initially pt with mucous in vestuble, but cleared with some swallow attempts. Pt was not able to tolerate suction to posterior oral cavity to clear. He was also unable to initiate effective cough to mobilize secretions (though reflexive cough is good). The pt demonstrated very slow mastication and transit of ice chip with spillage to left lateral channel and delayed swallow response. There was no visualization of penetration or aspiration. No further POs were trialed at this time due to severity of secretions, pts severe delay and generalized weakness.   Aspriation risk of liquids and solids is high.  Recommend pt begin a regimen of 2-3 ice chips following oral care every hour to help mobilize pharyngeal secretions and utilize the swallow mechanism. SLP instructed wife in oral care and ice chips administration as she is with pt most frequently. Discussed with RN as  well. SLP will follow closely for readiness for repeat exam.     Treatment Recommendation       Diet Recommendation NPO;Ice chips PRN after oral care   Liquid Administration via: Spoon Medication Administration: Via alternative means Supervision: Staff feed patient;Trained caregiver to feed patient Compensations: Multiple dry swallows after each bite/sip;Effortful swallow Postural Changes and/or Swallow Maneuvers: Seated upright 90 degrees     Other  Recommendations Oral Care Recommendations: Oral care QID;Oral care before and after PO Other Recommendations: Have oral suction available   Follow Up Recommendations  Inpatient Rehab    Frequency and Duration min 3x week  2 weeks   Pertinent Vitals/Pain NA    SLP Swallow Goals Goal #3: Patient will demonstrate ability to close vocal cords with cough and with verbalizations with mod. verbal and tactile cues. Swallow Study Goal #3 - Progress: Progressing toward goal Goal #4: Pt will consume ice chips with swallow within 7 seconds of oral administration, 2 effortful swallows with max verbal cues.  Swallow Study Goal #4 - Progress: Progressing toward goal   General HPI: 88 YOM admitted to [facility] for Cdiff on 2/2. Underwent decompressive cecostomy 2/3, initially was better, then worsened on 2/4 with increased abdominal distension and tenderness, decreased renal function.  He was taken back to the OR on 2/4 for total abdominal colectomy and end ileostomy.  Post op he developed ARDS and sepsis shock and was being treated for this.  The morning of 2/6 he had new onset of A-fib and transferred to [facility].   Type of Study: Fiberoptic Endoscopic Evaluation of Swallowing Reason for Referral: Objectively evaluate swallowing function Diet Prior to this Study: NPO (NG) Temperature Spikes Noted: No Respiratory Status: Supplemental O2 delivered via (comment) History of Recent Intubation: Yes Length of Intubations (days): 16 days Date extubated: 03/16/12 Behavior/Cognition: Alert;Cooperative Oral Cavity - Dentition: Adequate natural dentition Oral Motor / Sensory Function: Impaired - see Bedside swallow eval Self-Feeding Abilities: Total assist Patient Positioning: Upright in chair Baseline Vocal Quality: Hoarse;Breathy;Low vocal intensity Volitional Cough: Weak Volitional Swallow: Able to elicit Anatomy: Within functional limits Pharyngeal Secretions: Standing secretions in (comment)  (pharynx, thick ropey yellow secretions, pt unable to clear. )    Reason for Referral Objectively evaluate swallowing function   Oral Phase Oral Preparation/Oral Phase Oral Phase: Impaired Oral - Thin Oral - Thin Teaspoon: Reduced posterior propulsion;Delayed oral transit (ice chips)   Pharyngeal Phase Pharyngeal Phase Pharyngeal Phase: Impaired Pharyngeal - Thin Pharyngeal - Ice Chips: Premature spillage to pyriform sinuses;Reduced pharyngeal peristalsis;Reduced epiglottic inversion;Reduced anterior laryngeal mobility;Reduced laryngeal elevation;Reduced airway/laryngeal closure;Reduced tongue base retraction  Cervical Esophageal Phase    GO             Harlon Ditty, MA CCC-SLP [phone]  Claudine Mouton 03/20/2012, 10:56 AM

## 2012-03-20 NOTE — Progress Notes (Signed)
Needs Urology consultation.  Connor Small. Dahlia Bailiff, MD, [place] [phone] [facility]

## 2012-03-20 NOTE — Progress Notes (Addendum)
Speech Language Pathology Dysphagia Treatment Patient Details Name: Connor Small MRN: [id number] DOB: April 16, 1960 Today's Date: 03/20/2012 Time: 0745-0800 SLP Time Calculation (min): 15 min  Assessment / Plan / Recommendation Clinical Impression  Pt more alert this am though with obvious limitiations in endurance. Pt able to sustain attention to PO trials, initiate swallow with effort in 4/4 trials of ice and 1/4 teasp of water. No overt signs of aspiration. Phonatory abiilty still limited though cough response stronger than yesterday. Pt also observed to have sensitive gag reflex. SLP will plan for FEES around 11 am today. Please call SLP if there are any changes in pts disposition today.     Diet Recommendation  Continue with Current Diet: NPO    SLP Plan FEES;Continue with current plan of care   Pertinent Vitals/Pain NA   Swallowing Goals  SLP Swallowing Goals Goal #3: Patient will demonstrate ability to close vocal cords with cough and with verbalizations with mod. verbal and tactile cues. Swallow Study Goal #3 - Progress: Progressing toward goal Goal #4: Patient will complete objective swallow study prior to begining po diet. Swallow Study Goal #4 - Progress: Progressing toward goal  General Temperature Spikes Noted: Yes Respiratory Status: Supplemental O2 delivered via (comment) Behavior/Cognition: Alert;Cooperative Oral Cavity - Dentition: Adequate natural dentition Patient Positioning: Upright in bed  Oral Cavity - Oral Hygiene Does patient have any of the following "at risk" factors?: Nutritional status - dependent feeder Patient is HIGH RISK - Oral Care Protocol followed (see row info): Yes   Dysphagia Treatment Treatment focused on: Upgraded PO texture trials;Patient/family/caregiver education;Facilitation of pharyngeal phase Family/Caregiver Educated: wife Treatment Methods/Modalities: Skilled observation Patient observed directly with PO's: Yes Type of PO's  observed: Thin liquids;Ice chips Feeding: Total assist Liquids provided via: Teaspoon Oral Phase Signs & Symptoms: Prolonged oral phase;Prolonged bolus formation Pharyngeal Phase Signs & Symptoms: Suspected delayed swallow initiation (posturing with swallow) Type of cueing: Verbal;Tactile Amount of cueing: Minimal   GO    [name], MA CCC-SLP [phone]  [name] 03/20/2012, 8:10 AM

## 2012-03-20 NOTE — Progress Notes (Signed)
PULMONARY  / CRITICAL CARE MEDICINE  Name: Connor Small MRN: [id number] DOB: 05-Feb-1960    ADMISSION DATE:  02/26/2012 CONSULTATION DATE:  2/5  REFERRING MD :  Karilyn Cota  CHIEF COMPLAINT:  Acute resp failure   BRIEF PATIENT DESCRIPTION:  35 YOM admitted to [facility] for Cdiff on 2/2. Underwent decompressive cecostomy 2/3, initially was better, then worsened on 2/4 with increased abdominal distension and tenderness, decreased renal function.  He was taken back to the OR on 2/4 for total abdominal colectomy and end ileostomy.  Post op he developed ARDS and sepsis shock and was being treated for this.  The morning of 2/6 he had new onset of A-fib and transferred to [facility].    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 Overlying bowel gas and patientclinical status. 2. No explanation for elevated liver function tests.3. Similar to slight increase in size of a perisplenic fluidcollection.4. Right pleural effusion 2/20- reduction pressors, improved alertness 2/21- bleeding overnight from JP's, argatroban turned off, lower pressor needs 2/22- off pressors (yeah), on RA (yeah) 2/23- neg from JPs again, inr  reducing 2/24- concern dehiscence , some improved renal fxn with increased volume 2/24- possible dye change in output after dye 1 hr  2/25-relative brady  LINES / TUBES: ETT - OSH 2/4>>> Foley-OSH 2/3>>> PICC OSH 2/4>>>2/11 Art Line - OSH 2/5>>>out A line rt fem 2/18>>>2/24 Rt ij HD 2/17>>> Left IJ 2/18>>>  CULTURES: BCx2 2/4>>>negative BCx2 2/3>>>negative UC 2/3- Negative MRSA PCR 2/3- Negative C diff 2/6>>>Positive Body fluid 2/9>>>WBCs, no organisms 2/15 BC x 2 > 2/15 Sputum >>>NF  ANTIBIOTICS: Flagyl 2/3>>> Vanc 2/6 (IV) >>>2/7 azactam 2/6>>>2/7 Oral vanc 2/6>>> IV vanc 2/16 >2/24 Aztreonam 2/16 >2/17 Imipenem 2/17>>>2/24 mycofungin 2/18>>>2/24  SUBJECTIVE:  crt better  VITAL SIGNS: Temp:  [96.1 F (35.6 C)-97.5 F (36.4 C)] 97.4 F (36.3 C) (02/25 0843) Pulse Rate:  [77-103] 101 (02/25 0600) Resp:  [9-19] 17 (02/25 0600) BP: (93-131)/(42-70) 107/68 mmHg (02/25 0600) SpO2:  [98 %-100 %] 100 % (02/25 0600) Arterial Line BP: (98-117)/(37-65) 101/43 mmHg (02/24 1300) HEMODYNAMICS: CVP:  [1 mmHg-3 mmHg] 3 mmHg VENTILATOR SETTINGS:   INTAKE / OUTPUT: Intake/Output     02/24 0701 - 02/25 0700 02/25 0701 - 02/26 0700   I.V. (mL/kg) 4210 (49.8)    NG/GT 1200    IV Piggyback 100    Total Intake(mL/kg) 5510 (65.2)    Urine (mL/kg/hr) 2260 (1.1)    Drains 2930 (1.4)    Stool 1300 (0.6)    Total Output 6490     Net -980            PHYSICAL EXAMINATION: General awake, alert, weak Neuro: awake, unchanged weakness rue HEENT: lines clean neck  Cardiovascular: s1 s2 rrr Lungs: clear anterior, reduced base Abdomen: mild distention, ostomy, wound vac Musculoskeletal: 1+ edema about unchanged last 24 hrs Skin: follicular mild anterior rt thigh rash unchanged  LABS:  Recent Labs Lab 03/14/12 1123  03/15/12 0450 03/15/12 0458  03/15/12 1100  03/16/12 0450  03/17/12 0420 03/17/12 1115 03/18/12 0503  03/19/12 0440 03/19/12 1600 03/19/12 2230  03/20/12 0447  HGB  --   --  12.4*  --   --   --   < > 10.6*  --  9.1*  --  10.2*  --  9.2*  --  9.7* 10.1*  WBC  --   --  27.1*  --   --   --   < > 20.1*  --  15.2*  --  12.0*  --  10.8*  --  9.6 10.0  PLT  --   --  58*  --   --   --   < > 32*  --  30*  --  35*  --  36*  --  35* 38*  NA  --   < > 135  --   --   --   < > 137  < > 144  --  150*  < > 153* 152* 153* 152*  K  --   < > 4.7  --   --   --   < > 4.1  < > 3.5  --  2.9*  < > 3.4* 4.6 4.4 4.2  CL  --   < > 99  --   --   --   < > 103  < > 108  --  114*  < > 123* 124* 125* 123*  CO2  --   < > 24  --   --   --   < > 21  < > 20  --  22  < > 20 18* 18* 18*  GLUCOSE  --   < > 143*  --   --   --   < > 125*  < > 165*  --  181*  < > 162* 315* 242* 202*  BUN  --   < > 78*  --   --   --   < > 112*  < > 135*  --  150*  < > 138* 126* 126* 116*  CREATININE  --   < > 1.58*  --   --   --   < > 2.29*  < > 2.63*  --  2.61*  < > 2.37* 2.12* 2.10* 2.06*  CALCIUM  --   < > 7.8*  --   --   --   < > 7.8*  < > 7.8*  --  7.6*  < > 7.8* 7.3* 7.6* 7.9*  MG  --   --  2.8*  --   --   --   --  3.0*  --  3.1*  --   --   --   --   --  2.5 2.5  PHOS  --   < > 5.1*  --   --   --   < > 5.9*  < > 6.7*  --  5.8*  --  4.2  --  3.9 3.6  AST  --   --  62*  --   --   --   --  65*  --   --   --   --   --   --   --   --  193*  ALT  --   --  43  --   --   --   --  36  --   --   --   --   --   --   --   --  101*  ALKPHOS  --   --  386*  --   --   --   --  377*  --   --   --   --   --   --   --   --  592*  BILITOT  --   --  15.5*  --   --   --   --  15.4*  --   --  14.1*  --   --   --   --   --  14.9*  PROT  --   --  4.6*  --   --   --   --  4.2*  --   --   --   --   --   --   --   --  3.8*  ALBUMIN  --   < > 2.5*  --   --   --   < > 2.4*  < > 2.5*  --  2.2*  --  2.2*  --   --  1.9*  APTT  --   < >  --   --   < >  --   < > 84*  < > 63*  --  43*  --  35  --   --   --   INR  --   --   --   --   --   --   --  4.71*  --  3.78*  --  2.23*  --  1.92*  --   --  1.73*  PHART 7.416  --   --   7.440  --  7.475*  --   --   --   --   --   --   --   --   --   --   --   PCO2ART 35.0  --   --  36.8  --  32.2*  --   --   --   --   --   --   --   --   --   --   --   PO2ART 154.0*  --   --  194.0*  --  130.0*  --   --   --   --   --   --   --   --   --   --   --   < > = values in this interval not displayed.  Recent Labs Lab 03/19/12 1146 03/19/12 1534 03/19/12 1945 03/20/12 03/20/12 0321  GLUCAP 196* 204* 193* 226* 191*    Imaging: Dg Chest Port 1 View  03/19/2012  *RADIOLOGY REPORT*  Clinical Data: Assess collapse.  PORTABLE CHEST - 1 VIEW  Comparison: 03/15/2012  Findings: Interval removal of the endotracheal tube.  Right and left central venous catheters and enteric tubes remain in place. Normal heart size and pulmonary vascularity.  There appears to be developing left pleural effusion with atelectasis or infiltration in the left lung base.  Minimal residual atelectasis in the right lung base is stable.  No pneumothorax.  IMPRESSION: Developing small left pleural effusion with infiltration or atelectasis in the left lung base.  Mild atelectasis in the right lung base is  stable.  Appliances appear to be in satisfactory location.   Original Report Authenticated By: Burman Nieves, M.D.    CXR 2/20 >mild hazziness rt base, ett wnl None new  ASSESSMENT / PLAN:  PULMONARY A: Acute respiratory failure in setting of septic shock and volume overload.  Lt pleural effusion.  Extubated 2/20  P:   IS Encourage cough pcxr reviewed, last abg reviewed as well  CARDIOVASCULAR A: Septic Shock - Secondary to C-diff colitis; off pressors 2/08. Restarted 2/13 (relatd to volume?) Atrial Fibrillation with RVR.> Low [place] score. High output from JP's remains 2/22 - high risk hypotension from volume depletion from ATN output and JP's Clot around picc, dvt rue 2/11 Shock reoccurrence, related to sepsis? Volume? 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 Hx of ulcerative colitis. cholestatic picture conjugating, related to sepsis, voluem status - improved 2/23, slow improvement R/o acalculous chole At risk sclerosis cholangitis  P:   Tf tolerated still, continue at goal pepcid Fees today surgery recs a dye- rn reported some color change, will d/w surgery, will call urology Max protein Lactulose Will consider HIDA if safe fo rtransfer  HEMATOLOGIC A: Leukocytosis dvt picc aassociated rue 2/11 Small splenic hematoma? Thrombocytopenia , sepsis, HITT NEG  Dopplers legs - neg lowers P:  HITT neg No role plat tx If needed could re use heparin Cbc in am   INFECTIOUS A: C diff colitis, clinically progressing, Worsening rash r/o IMi?, r/o ureteral injury P:   cdiff treatment Will d/w urology need other empiric coverage  ENDOCRINE A: Hyperglycemia. Controlled NICE wnl Relative adrenal insufficiency. P:   SSI Lantus to continue with TF, increase Solucortef remain  NEUROLOGIC A:  Acute encephalopathy R/o stroke vs crit illness myopathy / neuroapthy Will need MRI brain and neck, wait till safe for this P:   Aggressive pt/ ot, splints Lactulose on going  Wife updated  Calling urology  Mcarthur Rossetti. Tyson Alias, MD, FACP Pgr: [phone] [facility]

## 2012-03-20 NOTE — Transfer of Care (Signed)
Immediate Anesthesia Transfer of Care Note  Patient: Connor Small  Procedure(s) Performed: Procedure(s): CYSTOSCOPY WITH RETROGRADE PYELOGRAM/URETERAL STENT PLACEMENT (Bilateral)  Patient Location: PACU  Anesthesia Type:General  Level of Consciousness: sedated  Airway & Oxygen Therapy: Patient connected to face mask oxygen  Post-op Assessment: Report given to PACU RN  Post vital signs: stable  Complications: No apparent anesthesia complications

## 2012-03-20 NOTE — Anesthesia Preprocedure Evaluation (Addendum)
Anesthesia Evaluation  Patient identified by MRN, date of birth, ID band Patient confused    Reviewed: Allergy & Precautions, H&P , NPO status , Patient's Chart, lab work & pertinent test results  History of Anesthesia Complications (+) AWARENESS UNDER ANESTHESIA  Airway Mallampati: I TM Distance: >3 FB Neck ROM: full    Dental  (+) Teeth Intact and Dental Advisory Given   Pulmonary          Cardiovascular + dysrhythmias Atrial Fibrillation Rhythm:regular Rate:Normal     Neuro/Psych    GI/Hepatic PUD,   Endo/Other    Renal/GU ESRF and DialysisRenal disease     Musculoskeletal   Abdominal (+)  Abdomen: soft. Bowel sounds: normal.  Peds  Hematology   Anesthesia Other Findings   Reproductive/Obstetrics                        Anesthesia Physical Anesthesia Plan  ASA: III  Anesthesia Plan: General   Post-op Pain Management:    Induction: Intravenous  Airway Management Planned: Oral ETT  Additional Equipment:   Intra-op Plan:   Post-operative Plan: Extubation in OR  Informed Consent: I have reviewed the patients History and Physical, chart, labs and discussed the procedure including the risks, benefits and alternatives for the proposed anesthesia with the patient or authorized representative who has indicated his/her understanding and acceptance.     Plan Discussed with: CRNA, Anesthesiologist and Surgeon  Anesthesia Plan Comments:         Anesthesia Quick Evaluation

## 2012-03-20 NOTE — Brief Op Note (Signed)
02/26/2012 - 03/20/2012  5:39 PM  PATIENT:  Joanna Hews  52 y.o. male  PRE-OPERATIVE DIAGNOSIS:  emergent  POST-OPERATIVE DIAGNOSIS:  * No post-op diagnosis entered *  PROCEDURE:  Procedure(s): CYSTOSCOPY WITH RETROGRADE PYELOGRAM/URETERAL STENT PLACEMENT (Bilateral)  SURGEON:  Surgeon(s) and Role:    * Alexis Frock, MD - Primary  PHYSICIAN ASSISTANT:   ASSISTANTS: none   ANESTHESIA:   general  EBL:  Total I/O In: 2352.5 [I.V.:1622.5; NG/GT:530; IV Piggyback:200] Out: 1875 [Urine:750; Drains:750; Stool:375]  BLOOD ADMINISTERED:none  DRAINS: 37F foley to gravitry drain   LOCAL MEDICATIONS USED:  NONE  SPECIMEN:  No Specimen  DISPOSITION OF SPECIMEN:  N/A  COUNTS:  YES  TOURNIQUET:  * No tourniquets in log *  DICTATION: .Other Dictation: Dictation Number [id number]  PLAN OF CARE: Return to ICU  PATIENT DISPOSITION:  PACU - hemodynamically stable.   Delay start of Pharmacological VTE agent (>24hrs) due to surgical blood loss or risk of bleeding: no

## 2012-03-21 ENCOUNTER — Inpatient Hospital Stay (HOSPITAL_COMMUNITY): Payer: BC Managed Care – PPO

## 2012-03-21 LAB — CBC WITH DIFFERENTIAL/PLATELET
Basophils Absolute: 0 10*3/uL (ref 0.0–0.1)
Basophils Relative: 0 % (ref 0–1)
Eosinophils Absolute: 0 10*3/uL (ref 0.0–0.7)
Hemoglobin: 10.4 g/dL — ABNORMAL LOW (ref 13.0–17.0)
Lymphocytes Relative: 3 % — ABNORMAL LOW (ref 12–46)
MCHC: 30.2 g/dL (ref 30.0–36.0)
Monocytes Relative: 5 % (ref 3–12)
Neutrophils Relative %: 92 % — ABNORMAL HIGH (ref 43–77)
RDW: 21.8 % — ABNORMAL HIGH (ref 11.5–15.5)
WBC: 10.6 10*3/uL — ABNORMAL HIGH (ref 4.0–10.5)

## 2012-03-21 LAB — COMPREHENSIVE METABOLIC PANEL
ALT: 120 U/L — ABNORMAL HIGH (ref 0–53)
BUN: 115 mg/dL — ABNORMAL HIGH (ref 6–23)
CO2: 16 mEq/L — ABNORMAL LOW (ref 19–32)
Calcium: 7.9 mg/dL — ABNORMAL LOW (ref 8.4–10.5)
GFR calc Af Amer: 41 mL/min — ABNORMAL LOW (ref >90)
GFR calc non Af Amer: 36 mL/min — ABNORMAL LOW (ref >90)
Glucose, Bld: 141 mg/dL — ABNORMAL HIGH (ref 70–99)
Sodium: 154 mEq/L — ABNORMAL HIGH (ref 135–145)

## 2012-03-21 LAB — GLUCOSE, CAPILLARY
Glucose-Capillary: 125 mg/dL — ABNORMAL HIGH (ref 70–99)
Glucose-Capillary: 207 mg/dL — ABNORMAL HIGH (ref 70–99)
Glucose-Capillary: 146 mg/dL — ABNORMAL HIGH (ref 70–99)

## 2012-03-21 LAB — PROTIME-INR: Prothrombin Time: 18.6 seconds — ABNORMAL HIGH (ref 11.6–15.2)

## 2012-03-21 MED ORDER — FENTANYL CITRATE 0.05 MG/ML IJ SOLN
25.0000 ug | INTRAMUSCULAR | Status: DC | PRN
  Administered 2012-03-21 – 2012-03-29 (×11): 25 ug via INTRAVENOUS
  Filled 2012-03-21 (×14): qty 2

## 2012-03-21 MED ORDER — VITAL AF 1.2 CAL PO LIQD
1000.0000 mL | ORAL | Status: DC
  Filled 2012-03-21 (×6): qty 1000

## 2012-03-21 MED ORDER — NYSTATIN-TRIAMCINOLONE 100000-0.1 UNIT/GM-% EX CREA
TOPICAL_CREAM | Freq: Two times a day (BID) | CUTANEOUS | Status: DC
  Administered 2012-03-21 – 2012-03-27 (×14): via TOPICAL
  Filled 2012-03-21: qty 30
  Filled 2012-03-21: qty 15

## 2012-03-21 MED ORDER — WHITE PETROLATUM GEL
Status: AC
  Administered 2012-03-21: 0.2
  Filled 2012-03-21: qty 5

## 2012-03-21 MED ORDER — LACTULOSE 10 GM/15ML PO SOLN
30.0000 g | Freq: Every day | ORAL | Status: DC
Start: 2012-03-21 — End: 2012-03-22
  Administered 2012-03-21: 30 g
  Filled 2012-03-21 (×2): qty 45

## 2012-03-21 MED ORDER — DEXTROSE 10 % IV SOLN
INTRAVENOUS | Status: DC
Start: 2012-03-22 — End: 2012-03-27
  Administered 2012-03-21: via INTRAVENOUS
  Administered 2012-03-25: 20 mL/h via INTRAVENOUS

## 2012-03-21 MED ORDER — SODIUM BICARBONATE 8.4 % IV SOLN
INTRAVENOUS | Status: DC
  Administered 2012-03-21 – 2012-03-26 (×6): via INTRAVENOUS
  Filled 2012-03-21 (×18): qty 50

## 2012-03-21 NOTE — Progress Notes (Signed)
Rehab Admissions Coordinator Note:  Patient was screened by Cleatrice Burke for appropriateness for an Inpatient Acute Rehab Consult.  Noted PT and OT feel pt may eventually need rehab but not yet at a level to do the intensity. I will follow along.  Cleatrice Burke 03/21/2012, 1:38 PM  I can be reached at [phone].

## 2012-03-21 NOTE — Progress Notes (Signed)
Subjective: Interval History: none.  Objective: Vital signs in last 24 hours: Temp:  [97 F (36.1 C)-97.9 F (36.6 C)] 97.9 F (36.6 C) (02/26 0749) Pulse Rate:  [75-108] 95 (02/26 0800) Resp:  [9-21] 12 (02/26 0800) BP: (88-105)/(53-68) 95/59 mmHg (02/26 0800) SpO2:  [95 %-100 %] 99 % (02/26 0800) Weight:  [81.4 kg (179 lb 7.3 oz)] 81.4 kg (179 lb 7.3 oz) (02/26 0500) Weight change:   Intake/Output from previous day: 02/25 0701 - 02/26 0700 In: 5092.5 [I.V.:3722.5; NG/GT:1170; IV Piggyback:200] Out: 1655 [Urine:1630; Drains:1575; Stool:950] Intake/Output this shift: Total I/O In: 190 [I.V.:150; NG/GT:40] Out: -   General appearance: cooperative, pale and slowed mentation Resp: diminished breath sounds bilaterally and rales bibasilar Cardio: S1, S2 normal and systolic murmur: holosystolic 2/6, blowing at apex GI: wound, with vac.  JP lower abdm, ostomy on R Extremities: edema 3+ Skin: Pale, jaundiced, rash legs and trunk  Lab Results:  Recent Labs  03/20/12 0447 03/21/12 0433  WBC 10.0 10.6*  HGB 10.1* 10.4*  HCT 33.1* 34.4*  PLT 38* 51*   BMET:  Recent Labs  03/20/12 0447 03/21/12 0433  NA 152* 154*  K 4.2 4.8  CL 123* 128*  CO2 18* 16*  GLUCOSE 202* 141*  BUN 116* 115*  CREATININE 2.06* 2.03*  CALCIUM 7.9* 7.9*   No results found for this basename: PTH,  in the last 72 hours Iron Studies:  Recent Labs  03/19/12 0753  IRON 54  TIBC 121*    Studies/Results: Dg Retrograde Pyelogram  03/20/2012  *RADIOLOGY REPORT*  Clinical data:   Renal insufficiency and possible ureteral injury and urine leak after total colectomy.  INTRAOPERATIVE BILATERAL RETROGRADE UROGRAPHY  Comparison:  CT on 03/11/2012  Technique:  Images were obtained with the C-arm fluoroscopic device intraoperatively and submitted for interpretation post-operatively. Please see the procedural report for the amount of contrast and the fluoroscopy time utilized.  Findings:  Intraoperative  imaging was performed with a C-arm.  This demonstrates cannulation of both distal ureters via a cystoscope. Contrast injection and bilateral retrograde pyelography demonstrates normal patency of the ureters and collecting system bilaterally with no evidence of urine leak or obstruction.  IMPRESSION: Unremarkable bilateral retrograde pyelogram images demonstrating no evidence of ureteral injury or urine leak.   Original Report Authenticated By: Aletta Edouard, M.D.    Dg Chest Port 1 View  03/19/2012  *RADIOLOGY REPORT*  Clinical Data: Assess collapse.  PORTABLE CHEST - 1 VIEW  Comparison: 03/15/2012  Findings: Interval removal of the endotracheal tube.  Right and left central venous catheters and enteric tubes remain in place. Normal heart size and pulmonary vascularity.  There appears to be developing left pleural effusion with atelectasis or infiltration in the left lung base.  Minimal residual atelectasis in the right lung base is stable.  No pneumothorax.  IMPRESSION: Developing small left pleural effusion with infiltration or atelectasis in the left lung base.  Mild atelectasis in the right lung base is stable.  Appliances appear to be in satisfactory location.   Original Report Authenticated By: Lucienne Capers, M.D.     I have reviewed the patient's current medications.  Assessment/Plan: 1 AKI cr stable to better. I>O for first time.  Will cont higher vol ivf but change to hypotonic bicarb.  ? if got stents 2 Acidemic stool and renal 3 Anemia stable 4 DM 5 Liver ?? secondaryprocess 6 Cdiff  P hypotonic bicarb, allow to diurese, nutrition, GI input    LOS: 24 days  Renaldo Gornick L 03/21/2012,8:52 AM

## 2012-03-21 NOTE — Progress Notes (Signed)
NUTRITION FOLLOW UP  Intervention:    To better meet nutrition needs, will change formula to Vital AF 1.2 at 50 ml/h, increase by 10 ml every 4 hours to goal rate of 80 ml/h to provide 2304 kcals, 144 gm protein, 1557 ml free water daily.  Discontinue Prostat, not needed to meet protein needs with new TF formula.  Nutrition Dx:   Inadequate oral intake related to inability to eat as evidenced by NPO status and s/p GI surgery, ongoing.  Goal: Intake to meet >90% of estimated nutrition needs, met.  Monitor:   EN regimen & tolerance, respiratory status, weight, labs, I/O's  Assessment: Extubated 2/20. Remains off CVVHD at this time. Making urine. Continues to be followed by renal.   Discussed patient in ICU rounds this morning.  Plans to change out NG for a smaller bore tube tomorrow.  S/P FEES with SLP 2/25; aspriation risk of liquids and solids is high.  SLP recommended a regimen of 2-3 ice chips following oral care every hour.    Wound VAC was placed on 2/24 to abdominal incisions.  Protein needs are increased, to support wound healing.   Height: Ht Readings from Last 1 Encounters:  02/26/12 5' 9"  (1.753 m)    Weight Status:   Wt Readings from Last 1 Encounters:  03/21/12 179 lb 7.3 oz (81.4 kg)  03/16/12  192 lb 0.3 oz (87.1 kg)  Admit wt 200 lb  Weight is trending down with overall negative fluid balance.  Body mass index is 26.49 kg/(m^2). Overweight.  Re-estimated needs:  Kcal: 2080 - 2280  Protein: 140 - 160 gm  Fluid: >/= 2.0 L  Skin: abdominal surgical incisions; now with wound VAC  Diet Order: NPO; Vital 1.5 at 50 ml/hr via NG tube with Prostat 30 ml TID providing 2100 kcal, 126 grams protein, 917 ml free water daily.  Free water flushes:  200 ml QID  With hypernatremia, likely needs additional free water.  Will change TF formula to increase free water and protein provision.  Discussed with Gerald Stabs, RN.   Intake/Output Summary (Last 24 hours) at 03/21/12  0958 Last data filed at 03/21/12 0800  Gross per 24 hour  Intake 4502.5 ml  Output   3525 ml  Net  977.5 ml    Last BM: 2/25  Labs:   Recent Labs Lab 03/17/12 0420  03/19/12 2230 03/20/12 0447 03/21/12 0433  NA 144  < > 153* 152* 154*  K 3.5  < > 4.4 4.2 4.8  CL 108  < > 125* 123* 128*  CO2 20  < > 18* 18* 16*  BUN 135*  < > 126* 116* 115*  CREATININE 2.63*  < > 2.10* 2.06* 2.03*  CALCIUM 7.8*  < > 7.6* 7.9* 7.9*  MG 3.1*  --  2.5 2.5  --   PHOS 6.7*  < > 3.9 3.6 4.5  GLUCOSE 165*  < > 242* 202* 141*  < > = values in this interval not displayed.  CBG (last 3)   Recent Labs  03/21/12 0037 03/21/12 0334 03/21/12 0742  GLUCAP 103* 125* 125*    Scheduled Meds: . antiseptic oral rinse  15 mL Mouth Rinse q12n4p  . chlorhexidine  15 mL Mouth Rinse BID  . feeding supplement  30 mL Per Tube TID  . free water  200 mL Per Tube QID  . hydrocortisone sodium succinate  50 mg Intravenous Q6H  . insulin aspart  0-20 Units Subcutaneous Q4H  . insulin  glargine  45 Units Subcutaneous Daily  . lactulose  30 g Per Tube Daily  . nystatin-triamcinolone   Topical BID  . ranitidine  150 mg Per Tube BID  . sodium bicarbonate  1,300 mg Oral TID  . sodium chloride  10-40 mL Intracatheter Q12H  . white petrolatum        Continuous Infusions: . sodium chloride    . dextrose 5 % and 0.45 % NaCl with KCl 20 mEq/L 150 mL/hr at 03/21/12 0847  . feeding supplement (VITAL 1.5 CAL) 1,000 mL (03/21/12 0043)  .  sodium bicarbonate  infusion 1000 mL 150 mL/hr at 03/21/12 [place], RD, LDN, [place] Pager# [phone] After Hours Pager# [phone]

## 2012-03-21 NOTE — Op Note (Signed)
NAMEMarland Kitchen  Connor, Small NO.:  [id number]  MEDICAL RECORD NO.:  [id number]  LOCATION:  2116                         FACILITY:  [facility]  PHYSICIAN:  Alexis Frock, MD     DATE OF BIRTH:  05-14-1960  DATE OF PROCEDURE: 03/20/2012 DATE OF DISCHARGE:                              OPERATIVE REPORT   PREOPERATIVE DIAGNOSIS:  Large output from peritoneal drains, question of ureteral injury following recent colectomy.  PROCEDURE:  Cystoscopy with bilateral retrograde pyelograms interpretation.  ESTIMATED BLOOD LOSS:  Nil.  DRAINS:  16-French Foley catheter straight drain.  COMPLICATIONS:  None.  SPECIMENS:  None.  FINDINGS: 1. No extravasation of contrast from either ureter. 2. No evidence of hydroureteronephrosis. 3. Unremarkable ureteric and urinary bladder.  INDICATION:  Mr. Geisen is a very unfortunate 52 year old gentleman with recent history of fulminant C. diff colitis prompting colectomy with end ileostomy.  He has had prolonged hospital stay including ICU admission for over 2 weeks.  He was noted to have persistent very, very high drain output from his surgical drains and somewhat worrisome for possible ureteral injury.  He has had laboratory analysis of this, which has been inconclusive with only a modest elevation of his JP, creatinine, Above that of serum.  He cannot tolerate intravenous contrast due to renal failure.  Further diagnostic options were discussed including operative cystoscopy with bilateral retrogrades.  The patient's family wished to proceed.  Informed consent was obtained and placed in medical record.  PROCEDURE IN DETAIL:  The patient being Connor Small was verified. Procedure being cystoscopy with bladder biopsy and fulguration was confirmed.  Procedure was carried out.  Time-out was performed. Intravenous antibiotics administered.  General LMA anesthesia was introduced.  The patient was placed into a low lithotomy position. Sterile  field was created by prepping and draping the patient's vagina, introitus, and proximal thighs using iodine x3.  Next, cystourethroscopy was performed using a 22-French rigid cystoscope with 12-degrees offset lens.  Inspection of anterior and posterior urethra were unremarkable. Inspection of urinary bladder revealed some bullous edema consistent with recent catheterization.  No papillary lesions, diverticula, calcifications, and perforation was noted.  The right ureteral orifice was then gently cannulated using a 6-French end-hole catheter and right retrograde pyelogram seen.  Right retrograde pyelogram demonstrated a single right ureter single system right kidney.  No filling defects or narrowing noted.  No evidence of extravasation on retrograde and delayed images.  Left retrograde pyelogram was then obtained.  Left retrograde pyelogram demonstrated single left ureter single system left kidney.  No filling defects, narrowing or extravasation noted.  No extravasation was noted on drainage films.  The cystoscope was then exchanged for a 16-French Foley catheter with 10 mL of water in the balloon.  Procedure was then terminated.  The patient tolerated the procedure well and there were no immediate periprocedural complications. The patient was taken to the postanesthesia care unit in stable condition.          ______________________________ Alexis Frock, MD     TM/MEDQ  D:  03/20/2012  T:  03/21/2012  Job:  [id number]

## 2012-03-21 NOTE — Consult Note (Signed)
[facility] Consult Note  Referring Provider: No ref. provider found Primary Care Physician:  No primary provider on file. Primary Gastroenterologist:  Dr.  Laurel Dimmer Complaint: Elevated bilirubin and alkaline phosphatase in the setting of emergent surgery and systemic infection HPI: Connor Small is an 52 y.o. white male  HPI: who underwent colectomy for toxic C. difficile colitis at an outlying hospital. He also has underlying ulcerative colitis for 9 years. He was septic from his C. difficile and transferred here to the intensive care unit. Around February 15 217 his bilirubin alkaline phosphatase began rising from normal values to approximately 14 and 600 presently with slight rises in transaminases currently in the 200 range. He had an ultrasound and CT scan on February 16 and 17th it were essentially unremarkable for any hepatic or hepatobiliary abnormalities. He has no known pre-existing liver disease according to his wife. He does not drink alcohol. He has never had a blood transfusion at least until his current illness.   Past Medical History  Diagnosis Date  . Chronic diarrhea   . Rectal bleed   . Hemorrhoids   . Ulcerative colitis     Distal UC over 8 yrs ago diagnosed  . Diverticulitis of large intestine with perforation 10/2011    done at [place]    Past Surgical History  Procedure Laterality Date  . Colonoscopy  9/03  . Sigmoidoscopy       06/13/2002  . Temporary ostomy November 06, 2010      for a colon perforation that was done in [facility] (Dr Payton Doughty).    . Colonoscopy  02/16/2011    Procedure: COLONOSCOPY;  Surgeon: Rogene Houston, MD;  Location: [facility];  Service: Endoscopy;  Laterality: N/A;  100  . Flexible sigmoidoscopy  02/27/2012    Procedure: FLEXIBLE SIGMOIDOSCOPY;  Surgeon: Rogene Houston, MD;  Location: [facility];  Service: Endoscopy;  Laterality: N/A;  with colonic decompression  . Laparotomy  02/27/2012    Procedure: EXPLORATORY  LAPAROTOMY;  Surgeon: Donato Heinz, MD;  Location: [facility];  Service: General;  Laterality: N/A;  . Cecostomy  02/27/2012    Procedure: CECOSTOMY;  Surgeon: Donato Heinz, MD;  Location: [facility];  Service: General;  Laterality: N/A;  Cecostomy Tube Placement  . Colectomy  02/28/2012    Procedure: TOTAL COLECTOMY;  Surgeon: Donato Heinz, MD;  Location: [facility];  Service: General;  Laterality: N/A;  . Ileostomy  02/28/2012    Procedure: ILEOSTOMY;  Surgeon: Donato Heinz, MD;  Location: [facility];  Service: General;  Laterality: N/A;    Medications Prior to Admission  Medication Sig Dispense Refill  . bifidobacterium infantis (ALIGN) capsule Take 1 capsule by mouth daily.  14 capsule  0  . FORTESTA 10 MG/ACT (2%) GEL at bedtime. Patient states that he applies it to his leg      . ibuprofen (ADVIL,MOTRIN) 200 MG tablet Take 400 mg by mouth 2 (two) times daily as needed. For pain      . Mesalamine (ASACOL HD) 800 MG TBEC Take 2 tablets (1,600 mg total) by mouth 2 (two) times daily.  180 tablet  3  . Pseudoeph-Doxylamine-DM-APAP (NYQUIL D COLD/FLU) 60-12.06-22-998 MG/30ML LIQD Take 30 mLs by mouth at bedtime. Cold/flu symptoms      . Pseudoephedrine-APAP-DM (DAYQUIL MULTI-SYMPTOM COLD/FLU PO) Take 1 capsule by mouth every 4 (four) hours as needed. Cold symptoms        Allergies:  Allergies  Allergen  Reactions  . Penicillins Other (See Comments)    Heart rate changes    Family History  Problem Relation Age of Onset  . Cancer Sister   . Healthy Daughter   . Hypertension Mother     Social History:  reports that he quit smoking about 27 years ago. His smoking use included Cigarettes. He smoked 0.00 packs per day. He has never used smokeless tobacco. He reports that he does not drink alcohol or use illicit drugs.  Review of Systems: negative except as above   Blood pressure 95/59, pulse 95, temperature 97.9 F (36.6 C), temperature source Oral, resp. rate 12, height 5' 9"  (1.753 m), weight  81.4 kg (179 lb 7.3 oz), SpO2 99.00%. Head: Normocephalic, without obvious abnormality, atraumatic Neck: no adenopathy, no carotid bruit, no JVD, supple, symmetrical, trachea midline and thyroid not enlarged, symmetric, no tenderness/mass/nodules Resp: clear to auscultation bilaterally Cardio: regular rate and rhythm, S1, S2 normal, no murmur, click, rub or gallop GI: Moderate scleral icterus abdomen soft no obvious tenderness or organomegaly. Extremities: extremities normal, atraumatic, no cyanosis or edema  Results for orders placed during the hospital encounter of 02/26/12 (from the past 48 hour(s))  GLUCOSE, CAPILLARY     Status: Abnormal   Collection Time    03/19/12  9:06 AM      Result Value Range   Glucose-Capillary 146 (*) 70 - 99 mg/dL  GLUCOSE, CAPILLARY     Status: Abnormal   Collection Time    03/19/12 11:46 AM      Result Value Range   Glucose-Capillary 196 (*) 70 - 99 mg/dL  GLUCOSE, CAPILLARY     Status: Abnormal   Collection Time    03/19/12  3:34 PM      Result Value Range   Glucose-Capillary 204 (*) 70 - 99 mg/dL  BASIC METABOLIC PANEL     Status: Abnormal   Collection Time    03/19/12  4:00 PM      Result Value Range   Sodium 152 (*) 135 - 145 mEq/L   Potassium 4.6  3.5 - 5.1 mEq/L   Comment: DELTA CHECK NOTED     ICTERIC SPECIMEN   Chloride 124 (*) 96 - 112 mEq/L   CO2 18 (*) 19 - 32 mEq/L   Glucose, Bld 315 (*) 70 - 99 mg/dL   BUN 126 (*) 6 - 23 mg/dL   Creatinine, Ser 2.12 (*) 0.50 - 1.35 mg/dL   Comment: ICTERUS AT THIS LEVEL MAY AFFECT RESULT   Calcium 7.3 (*) 8.4 - 10.5 mg/dL   GFR calc non Af Amer 34 (*) >90 mL/min   GFR calc Af Amer 39 (*) >90 mL/min   Comment:            The eGFR has been calculated     using the CKD EPI equation.     This calculation has not been     validated in all clinical     situations.     eGFR's persistently     <90 mL/min signify     possible Chronic Kidney Disease.  GLUCOSE, CAPILLARY     Status: Abnormal    Collection Time    03/19/12  7:45 PM      Result Value Range   Glucose-Capillary 193 (*) 70 - 99 mg/dL  CBC     Status: Abnormal   Collection Time    03/19/12 10:30 PM      Result Value Range   WBC 9.6  4.0 -  10.5 K/uL   RBC 2.99 (*) 4.22 - 5.81 MIL/uL   Hemoglobin 9.7 (*) 13.0 - 17.0 g/dL   HCT 31.3 (*) 39.0 - 52.0 %   MCV 104.7 (*) 78.0 - 100.0 fL   MCH 32.4  26.0 - 34.0 pg   MCHC 31.0  30.0 - 36.0 g/dL   RDW 22.9 (*) 11.5 - 15.5 %   Platelets 35 (*) 150 - 400 K/uL   Comment: PLATELET COUNT CONFIRMED BY SMEAR     SPECIMEN CHECKED FOR CLOTS     REPEATED TO VERIFY     PLATELETS APPEAR DECREASED  BASIC METABOLIC PANEL     Status: Abnormal   Collection Time    03/19/12 10:30 PM      Result Value Range   Sodium 153 (*) 135 - 145 mEq/L   Potassium 4.4  3.5 - 5.1 mEq/L   Chloride 125 (*) 96 - 112 mEq/L   CO2 18 (*) 19 - 32 mEq/L   Glucose, Bld 242 (*) 70 - 99 mg/dL   BUN 126 (*) 6 - 23 mg/dL   Creatinine, Ser 2.10 (*) 0.50 - 1.35 mg/dL   Comment: ICTERUS AT THIS LEVEL MAY AFFECT RESULT   Calcium 7.6 (*) 8.4 - 10.5 mg/dL   GFR calc non Af Amer 34 (*) >90 mL/min   GFR calc Af Amer 40 (*) >90 mL/min   Comment:            The eGFR has been calculated     using the CKD EPI equation.     This calculation has not been     validated in all clinical     situations.     eGFR's persistently     <90 mL/min signify     possible Chronic Kidney Disease.  MAGNESIUM     Status: None   Collection Time    03/19/12 10:30 PM      Result Value Range   Magnesium 2.5  1.5 - 2.5 mg/dL  PHOSPHORUS     Status: None   Collection Time    03/19/12 10:30 PM      Result Value Range   Phosphorus 3.9  2.3 - 4.6 mg/dL  BLOOD GAS, VENOUS     Status: Abnormal   Collection Time    03/19/12 10:33 PM      Result Value Range   FIO2 0.21     pH, Ven 7.341 (*) 7.250 - 7.300   pCO2, Ven 32.3 (*) 45.0 - 50.0 mmHg   pO2, Ven 38.4  30.0 - 45.0 mmHg   Bicarbonate 17.0 (*) 20.0 - 24.0 mEq/L   TCO2 18.0  0  - 100 mmol/L   Acid-base deficit 7.7 (*) 0.0 - 2.0 mmol/L   O2 Saturation 66.7     Patient temperature 98.6    GLUCOSE, CAPILLARY     Status: Abnormal   Collection Time    03/20/12 12:00 AM      Result Value Range   Glucose-Capillary 226 (*) 70 - 99 mg/dL  GLUCOSE, CAPILLARY     Status: Abnormal   Collection Time    03/20/12  3:21 AM      Result Value Range   Glucose-Capillary 191 (*) 70 - 99 mg/dL   Comment 1 Documented in Chart     Comment 2 Notify RN    PROTIME-INR     Status: Abnormal   Collection Time    03/20/12  4:47 AM      Result Value  Range   Prothrombin Time 19.7 (*) 11.6 - 15.2 seconds   INR 1.73 (*) 0.00 - 1.49  PHOSPHORUS     Status: None   Collection Time    03/20/12  4:47 AM      Result Value Range   Phosphorus 3.6  2.3 - 4.6 mg/dL  MAGNESIUM     Status: None   Collection Time    03/20/12  4:47 AM      Result Value Range   Magnesium 2.5  1.5 - 2.5 mg/dL  COMPREHENSIVE METABOLIC PANEL     Status: Abnormal   Collection Time    03/20/12  4:47 AM      Result Value Range   Sodium 152 (*) 135 - 145 mEq/L   Potassium 4.2  3.5 - 5.1 mEq/L   Chloride 123 (*) 96 - 112 mEq/L   CO2 18 (*) 19 - 32 mEq/L   Glucose, Bld 202 (*) 70 - 99 mg/dL   BUN 116 (*) 6 - 23 mg/dL   Creatinine, Ser 2.06 (*) 0.50 - 1.35 mg/dL   Comment: ICTERUS AT THIS LEVEL MAY AFFECT RESULT   Calcium 7.9 (*) 8.4 - 10.5 mg/dL   Total Protein 3.8 (*) 6.0 - 8.3 g/dL   Albumin 1.9 (*) 3.5 - 5.2 g/dL   AST 193 (*) 0 - 37 U/L   ALT 101 (*) 0 - 53 U/L   Alkaline Phosphatase 592 (*) 39 - 117 U/L   Total Bilirubin 14.9 (*) 0.3 - 1.2 mg/dL   GFR calc non Af Amer 35 (*) >90 mL/min   GFR calc Af Amer 41 (*) >90 mL/min   Comment:            The eGFR has been calculated     using the CKD EPI equation.     This calculation has not been     validated in all clinical     situations.     eGFR's persistently     <90 mL/min signify     possible Chronic Kidney Disease.  CBC     Status: Abnormal    Collection Time    03/20/12  4:47 AM      Result Value Range   WBC 10.0  4.0 - 10.5 K/uL   RBC 3.14 (*) 4.22 - 5.81 MIL/uL   Hemoglobin 10.1 (*) 13.0 - 17.0 g/dL   HCT 33.1 (*) 39.0 - 52.0 %   MCV 105.4 (*) 78.0 - 100.0 fL   MCH 32.2  26.0 - 34.0 pg   MCHC 30.5  30.0 - 36.0 g/dL   RDW 22.7 (*) 11.5 - 15.5 %   Platelets 38 (*) 150 - 400 K/uL   Comment: CONSISTENT WITH PREVIOUS RESULT  GLUCOSE, CAPILLARY     Status: Abnormal   Collection Time    03/20/12  8:42 AM      Result Value Range   Glucose-Capillary 179 (*) 70 - 99 mg/dL  GLUCOSE, CAPILLARY     Status: Abnormal   Collection Time    03/20/12 12:13 PM      Result Value Range   Glucose-Capillary 186 (*) 70 - 99 mg/dL  GLUCOSE, CAPILLARY     Status: Abnormal   Collection Time    03/20/12  3:49 PM      Result Value Range   Glucose-Capillary 101 (*) 70 - 99 mg/dL  GLUCOSE, CAPILLARY     Status: Abnormal   Collection Time    03/20/12  6:20 PM  Result Value Range   Glucose-Capillary 118 (*) 70 - 99 mg/dL  GLUCOSE, CAPILLARY     Status: None   Collection Time    03/20/12  7:26 PM      Result Value Range   Glucose-Capillary 95  70 - 99 mg/dL  GLUCOSE, CAPILLARY     Status: Abnormal   Collection Time    03/21/12 12:37 AM      Result Value Range   Glucose-Capillary 103 (*) 70 - 99 mg/dL   Comment 1 Documented in Chart     Comment 2 Notify RN    GLUCOSE, CAPILLARY     Status: Abnormal   Collection Time    03/21/12  3:34 AM      Result Value Range   Glucose-Capillary 125 (*) 70 - 99 mg/dL   Comment 1 Documented in Chart     Comment 2 Notify RN    PROTIME-INR     Status: Abnormal   Collection Time    03/21/12  4:33 AM      Result Value Range   Prothrombin Time 18.6 (*) 11.6 - 15.2 seconds   INR 1.61 (*) 0.00 - 1.49  COMPREHENSIVE METABOLIC PANEL     Status: Abnormal   Collection Time    03/21/12  4:33 AM      Result Value Range   Sodium 154 (*) 135 - 145 mEq/L   Potassium 4.8  3.5 - 5.1 mEq/L   Chloride 128  (*) 96 - 112 mEq/L   CO2 16 (*) 19 - 32 mEq/L   Glucose, Bld 141 (*) 70 - 99 mg/dL   BUN 115 (*) 6 - 23 mg/dL   Creatinine, Ser 2.03 (*) 0.50 - 1.35 mg/dL   Comment: ICTERUS AT THIS LEVEL MAY AFFECT RESULT   Calcium 7.9 (*) 8.4 - 10.5 mg/dL   Total Protein 3.7 (*) 6.0 - 8.3 g/dL   Albumin 1.8 (*) 3.5 - 5.2 g/dL   AST 210 (*) 0 - 37 U/L   ALT 120 (*) 0 - 53 U/L   Alkaline Phosphatase 649 (*) 39 - 117 U/L   Total Bilirubin 16.7 (*) 0.3 - 1.2 mg/dL   GFR calc non Af Amer 36 (*) >90 mL/min   GFR calc Af Amer 41 (*) >90 mL/min   Comment:            The eGFR has been calculated     using the CKD EPI equation.     This calculation has not been     validated in all clinical     situations.     eGFR's persistently     <90 mL/min signify     possible Chronic Kidney Disease.  CBC WITH DIFFERENTIAL     Status: Abnormal   Collection Time    03/21/12  4:33 AM      Result Value Range   WBC 10.6 (*) 4.0 - 10.5 K/uL   RBC 3.26 (*) 4.22 - 5.81 MIL/uL   Hemoglobin 10.4 (*) 13.0 - 17.0 g/dL   HCT 34.4 (*) 39.0 - 52.0 %   MCV 105.5 (*) 78.0 - 100.0 fL   MCH 31.9  26.0 - 34.0 pg   MCHC 30.2  30.0 - 36.0 g/dL   RDW 21.8 (*) 11.5 - 15.5 %   Platelets 51 (*) 150 - 400 K/uL   Comment: PLATELET COUNT CONFIRMED BY SMEAR     REPEATED TO VERIFY     SPECIMEN CHECKED FOR CLOTS   Neutrophils  Relative 92 (*) 43 - 77 %   Lymphocytes Relative 3 (*) 12 - 46 %   Monocytes Relative 5  3 - 12 %   Eosinophils Relative 0  0 - 5 %   Basophils Relative 0  0 - 1 %   Neutro Abs 9.8 (*) 1.7 - 7.7 K/uL   Lymphs Abs 0.3 (*) 0.7 - 4.0 K/uL   Monocytes Absolute 0.5  0.1 - 1.0 K/uL   Eosinophils Absolute 0.0  0.0 - 0.7 K/uL   Basophils Absolute 0.0  0.0 - 0.1 K/uL   RBC Morphology POLYCHROMASIA PRESENT     Comment: TARGET CELLS     PAPPENHEIMER BODIES     STOMATOCYTES  PHOSPHORUS     Status: None   Collection Time    03/21/12  4:33 AM      Result Value Range   Phosphorus 4.5  2.3 - 4.6 mg/dL  GLUCOSE,  CAPILLARY     Status: Abnormal   Collection Time    03/21/12  7:42 AM      Result Value Range   Glucose-Capillary 125 (*) 70 - 99 mg/dL   Dg Retrograde Pyelogram  03/20/2012  *RADIOLOGY REPORT*  Clinical data:   Renal insufficiency and possible ureteral injury and urine leak after total colectomy.  INTRAOPERATIVE BILATERAL RETROGRADE UROGRAPHY  Comparison:  CT on 03/11/2012  Technique:  Images were obtained with the C-arm fluoroscopic device intraoperatively and submitted for interpretation post-operatively. Please see the procedural report for the amount of contrast and the fluoroscopy time utilized.  Findings:  Intraoperative imaging was performed with a C-arm.  This demonstrates cannulation of both distal ureters via a cystoscope. Contrast injection and bilateral retrograde pyelography demonstrates normal patency of the ureters and collecting system bilaterally with no evidence of urine leak or obstruction.  IMPRESSION: Unremarkable bilateral retrograde pyelogram images demonstrating no evidence of ureteral injury or urine leak.   Original Report Authenticated By: Aletta Edouard, M.D.    Dg Chest Port 1 View  03/19/2012  *RADIOLOGY REPORT*  Clinical Data: Assess collapse.  PORTABLE CHEST - 1 VIEW  Comparison: 03/15/2012  Findings: Interval removal of the endotracheal tube.  Right and left central venous catheters and enteric tubes remain in place. Normal heart size and pulmonary vascularity.  There appears to be developing left pleural effusion with atelectasis or infiltration in the left lung base.  Minimal residual atelectasis in the right lung base is stable.  No pneumothorax.  IMPRESSION: Developing small left pleural effusion with infiltration or atelectasis in the left lung base.  Mild atelectasis in the right lung base is stable.  Appliances appear to be in satisfactory location.   Original Report Authenticated By: Lucienne Capers, M.D.     Assessment: Acute rise in liver function tests with  cholestatic pattern arising in the setting of acute surgical/medical insult. Overall I feel this is probably intrahepatic cholestasis related to multiple factors of his acute illness, need to rule out intra-and extrahepatic biliary obstruction which is not evident on previous scans.   Plan:  Given subsequent rise and LFTs in the last week and sensitivity of MRI greater than other imaging studies for primary sclerosing cholangitis, will obtain this study. Other serologic studies for identifiable causes of liver disease have are been ordered and are pending.  OINOM,VEHM C 03/21/2012, 8:41 AM

## 2012-03-21 NOTE — Progress Notes (Signed)
[facility] Progress Note 1 Day Post-Op  Subjective: No definitive ureteral leak was noted from what I can tell.  Also, there has been no appreciable drop in drain output   Objective: Vital signs in last 24 hours: Temp:  [97 F (36.1 C)-97.9 F (36.6 C)] 97.9 F (36.6 C) (02/26 0749) Pulse Rate:  [75-108] 95 (02/26 0800) Resp:  [9-21] 12 (02/26 0800) BP: (88-105)/(53-68) 95/59 mmHg (02/26 0800) SpO2:  [95 %-100 %] 99 % (02/26 0800) Weight:  [81.4 kg (179 lb 7.3 oz)] 81.4 kg (179 lb 7.3 oz) (02/26 0500) Last BM Date: 03/20/12  Intake/Output from previous day: 02/25 0701 - 02/26 0700 In: 5092.5 [I.V.:3722.5; NG/GT:1170; IV Piggyback:200] Out: 8299 [Urine:1630; Drains:1575; Stool:950] Intake/Output this shift: Total I/O In: 190 [I.V.:150; NG/GT:40] Out: -   General: No distress.  Going through swallowing evaluation.  Lungs: Clear  Abd: Soft, tolerating tube feedings.  Ileostomy working well.  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  INTRAOPERATIVE BILATERAL RETROGRADE UROGRAPHY  Comparison:  CT on 03/11/2012  Technique:  Images were obtained with the C-arm fluoroscopic device intraoperatively and submitted for interpretation post-operatively. Please see the procedural report for the amount of contrast and the fluoroscopy time utilized.  Findings:  Intraoperative imaging was performed with a C-arm.  This demonstrates cannulation of both distal ureters via  a cystoscope. Contrast injection and bilateral retrograde pyelography demonstrates normal patency of the ureters and collecting system bilaterally with no evidence of urine leak or obstruction.  IMPRESSION: Unremarkable bilateral retrograde pyelogram images demonstrating no evidence of ureteral injury or urine leak.   Original Report Authenticated By: Aletta Edouard, M.D.    Dg Chest Port 1 View  03/19/2012  *RADIOLOGY REPORT*  Clinical Data: Assess collapse.  PORTABLE CHEST - 1 VIEW  Comparison: 03/15/2012  Findings: Interval removal of the endotracheal tube.  Right and left central venous catheters and enteric tubes remain in place. Normal heart size and pulmonary vascularity.  There appears to be developing left pleural effusion with atelectasis or infiltration in the left lung base.  Minimal residual atelectasis in the right lung base is stable.  No pneumothorax.  IMPRESSION: Developing small left pleural effusion with infiltration or atelectasis in the left lung base.  Mild atelectasis in the right lung base is stable.  Appliances appear to be in satisfactory location.   Original Report Authenticated By: Lucienne Capers, M.D.     Anti-infectives: Anti-infectives   Start     Dose/Rate Route Frequency Ordered Stop   03/20/12 1145  ciprofloxacin (CIPRO) IVPB 400 mg    Comments:  ON CALL TO OR   400 mg 200 mL/hr over 60 Minutes Intravenous  Once 03/20/12 1143 03/20/12 1722   03/18/12 1000  vancomycin (VANCOCIN) 1,500 mg in sodium chloride 0.9 % 500 mL IVPB  Status:  Discontinued     1,500 mg 250 mL/hr over 120 Minutes Intravenous Every 48  hours 03/17/12 1920 03/19/12 0840   03/14/12 1000  micafungin (MYCAMINE) 100 mg in sodium chloride 0.9 % 100 mL IVPB  Status:  Discontinued     100 mg 100 mL/hr over 1 Hours Intravenous Daily 03/13/12 0632 03/19/12 0840   03/13/12 1000  micafungin (MYCAMINE) 100 mg in sodium chloride 0.9 % 100 mL IVPB     100 mg 100 mL/hr over 1 Hours Intravenous Daily  03/13/12 0632 03/13/12 1311   03/12/12 1800  vancomycin (VANCOCIN) IVPB 1000 mg/200 mL premix  Status:  Discontinued     1,000 mg 200 mL/hr over 60 Minutes Intravenous Every 24 hours 03/12/12 0859 03/17/12 1017   03/12/12 1000  imipenem-cilastatin (PRIMAXIN) 500 mg in sodium chloride 0.9 % 100 mL IVPB  Status:  Discontinued     500 mg 200 mL/hr over 30 Minutes Intravenous 3 times per day 03/12/12 0848 03/12/12 0909   03/12/12 1000  imipenem-cilastatin (PRIMAXIN) 250 mg in sodium chloride 0.9 % 100 mL IVPB  Status:  Discontinued     250 mg 200 mL/hr over 30 Minutes Intravenous 4 times per day 03/12/12 0909 03/19/12 0841   03/11/12 1200  vancomycin (VANCOCIN) 750 mg in sodium chloride 0.9 % 150 mL IVPB  Status:  Discontinued     750 mg 150 mL/hr over 60 Minutes Intravenous Every 12 hours 03/11/12 0204 03/12/12 0858   03/11/12 0215  vancomycin (VANCOCIN) 750 mg in sodium chloride 0.9 % 150 mL IVPB     750 mg 150 mL/hr over 60 Minutes Intravenous  Once 03/11/12 0204 03/11/12 0352   03/11/12 0215  aztreonam (AZACTAM) 1 g in dextrose 5 % 50 mL IVPB  Status:  Discontinued     1 g 100 mL/hr over 30 Minutes Intravenous 3 times per day 03/11/12 0204 03/12/12 0851   03/05/12 2200  metroNIDAZOLE (FLAGYL) IVPB 500 mg  Status:  Discontinued    Comments:  First dose ASAP   500 mg 100 mL/hr over 60 Minutes Intravenous Every 8 hours 03/05/12 1346 03/19/12 1509   03/02/12 1200  vancomycin (VANCOCIN) 50 mg/mL oral solution 500 mg  Status:  Discontinued     500 mg Oral 4 times per day 03/02/12 0913 03/19/12 1509   03/01/12 1445  vancomycin (VANCOCIN) 50 mg/mL oral solution 500 mg  Status:  Discontinued     500 mg Oral 3 times per day 03/01/12 1340 03/02/12 0913   03/01/12 1400  aztreonam (AZACTAM) 1 g in dextrose 5 % 50 mL IVPB  Status:  Discontinued     1 g 100 mL/hr over 30 Minutes Intravenous 3 times per day 03/01/12 1356 03/02/12 1146   03/01/12 1300  vancomycin (VANCOCIN) 1,250 mg in sodium chloride  0.9 % 250 mL IVPB  Status:  Discontinued     1,250 mg 166.7 mL/hr over 90 Minutes Intravenous Every 12 hours 03/01/12 1127 03/02/12 1146   02/29/12 0600  ciprofloxacin (CIPRO) IVPB 400 mg     400 mg 200 mL/hr over 60 Minutes Intravenous On call to O.R. 02/28/12 1409 02/28/12 1438   02/28/12 2200  vancomycin (VANCOCIN) 500 mg in sodium chloride irrigation 0.9 % 60 mL ENEMA  Status:  Discontinued     500 mg Rectal 2 times daily 02/28/12 1504 02/28/12 1817   02/28/12 1200  vancomycin (VANCOCIN) 50 mg/mL oral solution 500 mg  Status:  Discontinued    Comments:  First dose ASAP   500 mg Per Tube 4 times per day 02/28/12 0814 02/28/12 1817  02/28/12 1000  vancomycin (VANCOCIN) IVPB 1000 mg/200 mL premix  Status:  Discontinued     1,000 mg 200 mL/hr over 60 Minutes Intravenous Every 12 hours 02/28/12 0845 03/01/12 1127   02/28/12 1000  vancomycin (VANCOCIN) 500 mg in sodium chloride irrigation 0.9 % 100 mL ENEMA  Status:  Discontinued     500 mg Rectal 2 times daily 02/28/12 0923 02/28/12 1504   02/27/12 1600  metroNIDAZOLE (FLAGYL) IVPB 500 mg  Status:  Discontinued     500 mg 100 mL/hr over 60 Minutes Intravenous Every 8 hours 02/27/12 1546 02/27/12 2000   02/27/12 1600  ciprofloxacin (CIPRO) IVPB 400 mg  Status:  Discontinued     400 mg 200 mL/hr over 60 Minutes Intravenous Every 12 hours 02/27/12 1547 02/27/12 2000   02/27/12 1545  metroNIDAZOLE (FLAGYL) IVPB 500 mg  Status:  Discontinued     500 mg 100 mL/hr over 60 Minutes Intravenous  Once 02/27/12 1535 02/28/12 0843   02/27/12 1545  vancomycin (VANCOCIN) 500 mg in sodium chloride 0.9 % 100 mL IVPB  Status:  Discontinued     500 mg 100 mL/hr over 60 Minutes Intravenous  Once 02/27/12 1535 02/27/12 1538   02/27/12 1545  ciprofloxacin (CIPRO) IVPB 400 mg  Status:  Discontinued     400 mg 200 mL/hr over 60 Minutes Intravenous  Once 02/27/12 1538 02/28/12 0843   02/27/12 1415  vancomycin (VANCOCIN) 500 mg in sodium chloride 0.9 % 100 mL  IVPB     500 mg 100 mL/hr over 60 Minutes Intravenous  Once 02/27/12 1402 02/27/12 1411   02/27/12 1400  vancomycin (VANCOCIN) powder 500 mg  Status:  Discontinued     500 mg Other To Surgery 02/27/12 1359 02/27/12 1402   02/27/12 1200  metroNIDAZOLE (FLAGYL) IVPB 500 mg  Status:  Discontinued    Comments:  First dose ASAP   500 mg 100 mL/hr over 60 Minutes Intravenous Every 6 hours 02/27/12 0953 03/05/12 1346   02/27/12 1200  vancomycin (VANCOCIN) 50 mg/mL oral solution 250 mg  Status:  Discontinued    Comments:  First dose ASAP   250 mg Oral 4 times per day 02/27/12 0953 02/28/12 0814   02/26/12 1700  oseltamivir (TAMIFLU) capsule 75 mg     75 mg Oral  Once 02/26/12 1651 02/26/12 1715      Assessment/Plan: s/p Procedure(s): CYSTOSCOPY WITH RETROGRADE PYELOGRAM/URETERAL STENT PLACEMENT No additinal thoughts about what can be done about peritoneal drainage.   I believe that if we just pulled the tubes he will continue to leak from the drain sites.  LOS: 24 days   Kathryne Eriksson. Dahlia Bailiff, MD, FACS [phone] [facility] Surgery 03/21/2012

## 2012-03-21 NOTE — Progress Notes (Signed)
Physical Therapy Treatment Patient Details Name: Connor Small MRN: [id number] DOB: [birth year] Today's Date: 03/21/2012 Time: 6578-4696 PT Time Calculation (min): 40 min  PT Assessment / Plan / Recommendation Comments on Treatment Session  Noted pt with more voluntary movement in the LE's , esp R LE and extension greater than flexiion.  Pt still has trouble focusing on task or attending  when multiple tasks are occuring. Emphasis today on educating wife on PROM of UE's/LE's and positioning, esp. UE's as well as sitting tolerance and balance/truncal activation.    Follow Up Recommendations  CIR     Does the patient have the potential to tolerate intense rehabilitation     Barriers to Discharge        Equipment Recommendations   (TBD post acute)    Recommendations for Other Services Rehab consult  Frequency Min 3X/week   Plan Discharge plan remains appropriate;Frequency needs to be updated    Precautions / Restrictions Precautions Precautions: Fall Precaution Comments: contact Restrictions Weight Bearing Restrictions: No   Pertinent Vitals/Pain Vitals stable/ contact for Ddiff    Mobility  Bed Mobility Bed Mobility: Rolling Right;Rolling Left;Left Sidelying to Sit;Sitting - Scoot to [place] of Bed;Sit to Sidelying Left Rolling Right: 1: +2 Total assist Rolling Right: Patient Percentage: 10% Rolling Left: 1: +2 Total assist Rolling Left: Patient Percentage: 10% Left Sidelying to Sit: 1: +2 Total assist;HOB flat Left Sidelying to Sit: Patient Percentage: 10% Sitting - Scoot to Edge of Bed: 1: +2 Total assist Sitting - Scoot to Edge of Bed: Patient Percentage: 0% Sit to Supine: 1: +2 Total assist Sit to Supine: Patient Percentage: 10% (or less) Details for Bed Mobility Assistance: vc/tc's for direction/normalized movement; significant truncal assist/support Transfers Transfers: Not assessed Ambulation/Gait Ambulation/Gait Assistance: Not tested (comment)    Exercises  General Exercises - Lower Extremity Ankle Circles/Pumps: PROM;Both;5 reps;Other (comment) Heel Slides: PROM;AAROM;10 reps;Other (comment);Left;Both;Supine Hip ABduction/ADduction: PROM;5 reps;Right;Supine;Other (comment) (as instruction for family) Other Exercises Other Exercises: Taught wife PROM techinques of the legs Other Exercises: Assisted in PROM of uppers and lowers and positioning   PT Diagnosis:    PT Problem List:   PT Treatment Interventions:     PT Goals Acute Rehab PT Goals Time For Goal Achievement: 03/26/12 Potential to Achieve Goals: Good Pt will go Supine/Side to Sit: with mod assist;with rail PT Goal: Supine/Side to Sit - Progress: Progressing toward goal Pt will Sit at Edge of Bed: with mod assist;1-2 min;with bilateral upper extremity support PT Goal: Sit at Edge Of Bed - Progress: Progressing toward goal Pt will Perform Home Exercise Program: Other (comment) PT Goal: Perform Home Exercise Program - Progress: Progressing toward goal  Visit Information  Last PT Received On: 03/21/12 Assistance Needed: +2 PT/OT Co-Evaluation/Treatment: Yes    Subjective Data  Subjective: Answering "yes"/"no" after asked to answer verbally   Cognition  Cognition Overall Cognitive Status: Impaired Area of Impairment: Attention;Following commands;Problem solving Arousal/Alertness: Awake/alert Behavior During Session: Flat affect Current Attention Level: Sustained Attention - Other Comments: mod vc to redirect to task Following Commands: Follows one step commands inconsistently    Balance  Balance Balance Assessed: Yes Static Sitting Balance Static Sitting - Balance Support: Feet supported;Bilateral upper extremity supported (arms suppored, but not full weight bearing) Static Sitting - Level of Assistance: 1: +1 Total assist;2: Max assist Static Sitting - Comment/# of Minutes: >=20 min working on truncal activation, spinal/cervical extension, tracking.  sitting balance in  midline and propped laterally on each elbow. Dynamic Sitting Balance Dynamic  Sitting - Balance Support: During functional activity;Feet supported;Left upper extremity supported;Right upper extremity supported Dynamic Sitting - Level of Assistance: 1: +1 Total assist;1: +2 Total assist (for w/shift and reaching/leaning out of BOS)  End of Session PT - End of Session Activity Tolerance: Patient limited by fatigue;Patient tolerated treatment well Patient left: in bed;with bed alarm set;with call bell/phone within reach Nurse Communication: Mobility status   GP     Nylani Michetti, Eliseo Gum 03/21/2012, 5:27 PM  03/21/2012  [place] Bing, PT [phone] (pager)

## 2012-03-21 NOTE — Progress Notes (Signed)
Speech Language Pathology Dysphagia Treatment Patient Details Name: Connor Small MRN: [id number] DOB: 1960-07-13 Today's Date: 03/21/2012 Time: 0812-0830 SLP Time Calculation (min): 18 min  Assessment / Plan / Recommendation Clinical Impression  Pt appropriately alert. SLP completed oral care and proved max to total assist for upright posture, head control and chin tuck. Moderate verbal cues for multiple effortful swallow with ice chip trials. Pts pharyngeal strength subjectively improved. Pt was able to mobilize and expectorate pharyngeal secretions x1 during session. Cough continues to be weak. Recommend pt continue 2-3 ice chips hourly after oral care with RN and wife today, possible repeat FEES tomorrow. Suggest pulling NG prior to next assessment. RN suggested smaller bore NG if needed to be replaced.     Diet Recommendation  Continue with Current Diet: NPO;Other (comment) (ice chips after oral care every hour)    SLP Plan FEES;Continue with current plan of care   Pertinent Vitals/Pain NA   Swallowing Goals  SLP Swallowing Goals Goal #3: Patient will demonstrate ability to close vocal cords with cough and with verbalizations with mod. verbal and tactile cues. Swallow Study Goal #3 - Progress: Progressing toward goal Goal #4: Pt will consume ice chips with swallow within 7 seconds of oral administration, 2 effortful swallows with max verbal cues.  Swallow Study Goal #4 - Progress: Progressing toward goal  General Temperature Spikes Noted: No Respiratory Status: Supplemental O2 delivered via (comment) Behavior/Cognition: Alert Oral Cavity - Dentition: Adequate natural dentition Patient Positioning: Upright in bed  Oral Cavity - Oral Hygiene Patient is HIGH RISK - Oral Care Protocol followed (see row info): Yes   Dysphagia Treatment Treatment focused on: Upgraded PO texture trials;Facilitation of oral phase;Facilitation of pharyngeal phase;Utilization of compensatory  strategies Treatment Methods/Modalities: Effortful swallow;Skilled observation Patient observed directly with PO's: Yes Type of PO's observed: Ice chips Feeding: Total assist Liquids provided via: Teaspoon Oral Phase Signs & Symptoms: Prolonged oral phase;Prolonged bolus formation Pharyngeal Phase Signs & Symptoms: Suspected delayed swallow initiation;Immediate cough Type of cueing: Verbal;Tactile Amount of cueing: Moderate   GO    [name], [place] CCC-SLP [phone]  [name] 03/21/2012, 11:24 AM

## 2012-03-21 NOTE — Clinical Social Work Placement (Addendum)
    Clinical Social Work Department CLINICAL SOCIAL WORK PLACEMENT NOTE 03/21/2012  Patient:  Connor Small, Connor Small  Account Number:  [id number] Admit date:  02/26/2012  Clinical Social Worker:  Katrinka Blazing  Date/time:  03/21/2012 03:08 PM  Clinical Social Work is seeking post-discharge placement for this patient at the following level of care:   SKILLED NURSING   (*CSW will update this form in Epic as items are completed)   03/21/2012  Patient/family provided with [place] Department of Clinical Social Work's list of facilities offering this level of care within the geographic area requested by the patient (or if unable, by the patient's family).  03/21/2012  Patient/family informed of their freedom to choose among providers that offer the needed level of care, that participate in Medicare, Medicaid or managed care program needed by the patient, have an available bed and are willing to accept the patient.  03/21/2012  Patient/family informed of [facility]' ownership interest in [facility], as well as of the fact that they are under no obligation to receive care at this facility.  PASARR submitted to EDS on 03/21/2012 PASARR number received from EDS on 03/21/2012  FL2 transmitted to all facilities in geographic area requested by pt/family on  03/21/2012 FL2 transmitted to all facilities within larger geographic area on   Patient informed that his/her managed care company has contracts with or will negotiate with  certain facilities, including the following:     Patient/family informed of bed offers received:  03/22/12 Patient chooses bed at  Physician recommends and patient chooses bed at    Patient to be transferred to  on   Patient to be transferred to facility by   The following physician request were entered in Epic:   Additional Comments:

## 2012-03-21 NOTE — Anesthesia Postprocedure Evaluation (Signed)
  Anesthesia Post-op Note  Patient: Connor Small  Procedure(s) Performed: Procedure(s): CYSTOSCOPY WITH RETROGRADE PYELOGRAM/URETERAL STENT PLACEMENT (Bilateral)  Patient Location: PACU  Anesthesia Type:General  Level of Consciousness: awake, sedated and patient cooperative  Airway and Oxygen Therapy: Patient Spontanous Breathing  Post-op Pain: none  Post-op Assessment: Post-op Vital signs reviewed, Patient's Cardiovascular Status Stable, Respiratory Function Stable, Patent Airway, No signs of Nausea or vomiting and Pain level controlled  Post-op Vital Signs: stable  Complications: No apparent anesthesia complications

## 2012-03-21 NOTE — Progress Notes (Signed)
PULMONARY  / CRITICAL CARE MEDICINE  Name: Connor Small MRN: [id number] DOB: [birth year]    ADMISSION DATE:  02/26/2012 CONSULTATION DATE:  2/5  REFERRING MD :  Karilyn Cota  CHIEF COMPLAINT:  Acute resp failure   BRIEF PATIENT DESCRIPTION:  31 YOM admitted to [facility] for Cdiff on 2/2. Underwent decompressive cecostomy 2/3, initially was better, then worsened on 2/4 with increased abdominal distension and tenderness, decreased renal function.  He was taken back to the OR on 2/4 for total abdominal colectomy and end ileostomy.  Post op he developed ARDS and sepsis shock and was being treated for this.  The morning of 2/6 he had new onset of A-fib and transferred to [facility].    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 Overlying bowel gas and patientclinical status. 2. No explanation for elevated liver function tests.3. Similar to slight increase in size of a perisplenic fluidcollection.4. Right pleural effusion 2/20- reduction pressors, improved alertness 2/21- bleeding overnight from JP's, argatroban turned off, lower pressor needs 2/22- off pressors (yeah), on RA (yeah) 2/23- neg from JPs again, inr  reducing 2/24- concern dehiscence , some improved renal fxn with increased volume 2/24- possible dye change in output after dye 1 hr  2/25-relative brady 2/25 cysto neg injury  LINES / TUBES: ETT - OSH 2/4>>> Foley-OSH 2/3>>> PICC OSH 2/4>>>2/11 Art Line - OSH 2/5>>>out A line rt fem 2/18>>>2/24 Rt ij HD 2/17>>> Left IJ 2/18>>>  CULTURES: BCx2 2/4>>>negative BCx2 2/3>>>negative UC 2/3- Negative MRSA PCR 2/3- Negative C diff 2/6>>>Positive Body fluid 2/9>>>WBCs, no organisms 2/15 BC x 2 > 2/15 Sputum >>>NF  ANTIBIOTICS: Flagyl 2/3>>> Vanc 2/6 (IV) >>>2/7 azactam 2/6>>>2/7 Oral vanc 2/6>>> IV vanc 2/16 >2/24 Aztreonam 2/16 >2/17 Imipenem 2/17>>>2/24 mycofungin 2/18>>>2/24  SUBJECTIVE:  Cysto neg , stents placed  VITAL SIGNS: Temp:  [97 F (36.1 C)-97.9 F (36.6 C)] 97.9 F (36.6 C) (02/26 0749) Pulse Rate:  [75-108] 95 (02/26 0800) Resp:  [9-21] 12 (02/26 0800) BP: (88-105)/(53-68) 95/59 mmHg (02/26 0800) SpO2:  [95 %-100 %] 99 % (02/26 0800) Weight:  [81.4 kg (179 lb 7.3 oz)] 81.4 kg (179 lb 7.3 oz) (02/26 0500) HEMODYNAMICS: CVP:  [4 mmHg-6 mmHg] 6 mmHg VENTILATOR SETTINGS:   INTAKE / OUTPUT: Intake/Output     02/25 0701 - 02/26 0700 02/26 0701 - 02/27 0700   I.V. (mL/kg) 3722.5 (45.7) 150 (1.8)   NG/GT 1170 40   IV Piggyback 200    Total Intake(mL/kg) 5092.5 (62.6) 190 (2.3)   Urine (mL/kg/hr) 1630 (0.8)    Drains 1575 (0.8)    Stool 950 (0.5)    Total Output 4155     Net +937.5 +190  PHYSICAL EXAMINATION: General awake, alert Neuro: awake, unchanged weakness rue, limited shoulder movement HEENT: lines clean neck Cardiovascular: s1 s2 rrr Lungs: clear anterior Abdomen: mild distention, ostomy appears ok , wound vac Musculoskeletal: 2+ edema about unchanged last 24 hrs Skin: follicular mild anterior rt thigh rash unchanged  LABS:  Recent Labs Lab 03/14/12 1123  03/15/12 0458  03/15/12 1100  03/16/12 0450  03/17/12 0420  03/17/12 1115 03/18/12 0503  03/19/12 0440  03/19/12 2230 03/20/12 0447 03/21/12 0433  HGB  --   < >  --   --   --   < > 10.6*  --  9.1*  --  10.2*  --  9.2*  --  9.7* 10.1* 10.4*  WBC  --   < >  --   --   --   < > 20.1*  --  15.2*  --  12.0*  --  10.8*  --  9.6 10.0 10.6*  PLT  --   < >  --   --   --   < > 32*  --  30*  --  35*  --  36*  --  35* 38* 51*  NA  --   < >  --   --   --   < > 137  < > 144  --  150*  < > 153*  < > 153* 152* 154*  K  --   < >  --   --   --   < > 4.1  < > 3.5  --  2.9*  < > 3.4*  < > 4.4 4.2 4.8  CL  --   < >  --   --   --   < > 103  < > 108  --  114*  < > 123*  < > 125* 123* 128*  CO2  --   < >  --   --   --   < > 21  < > 20  --  22  < > 20  < > 18* 18* 16*  GLUCOSE  --   < >  --   --   --   < > 125*  < > 165*  --  181*  < > 162*  < > 242* 202* 141*  BUN  --   < >  --   --   --   < > 112*  < > 135*  --  150*  < > 138*  < > 126* 116* 115*  CREATININE  --   < >  --   --   --   < > 2.29*  < > 2.63*  --  2.61*  < > 2.37*  < > 2.10* 2.06* 2.03*  CALCIUM  --   < >  --   --   --   < > 7.8*  < > 7.8*  --  7.6*  < > 7.8*  < > 7.6* 7.9* 7.9*  MG  --   < >  --   --   --   --  3.0*  --  3.1*  --   --   --   --   --  2.5 2.5  --   PHOS  --   < >  --   --   --   < > 5.9*  < > 6.7*  --  5.8*  --  4.2  --  3.9 3.6 4.5  AST  --   < >  --   --   --   --  65*  --   --   --   --   --   --   --   --  193* 210*  ALT  --   < >  --   --   --   --  36  --   --   --   --   --   --   --   --  101* 120*  ALKPHOS  --   < >  --   --   --   --  377*  --   --   --   --   --   --   --   --  592* 649*  BILITOT  --   < >  --   --   --   --  15.4*  --   --  14.1*  --   --   --   --   --  14.9* 16.7*  PROT  --   < >  --   --   --   --  4.2*  --   --   --   --   --   --   --   --  3.8* 3.7*  ALBUMIN  --   < >  --   --   --   < > 2.4*  < > 2.5*  --  2.2*  --  2.2*  --   --  1.9* 1.8*  APTT  --   < >  --   < >  --   < > 84*  < > 63*  --  43*  --  35  --   --   --   --   INR  --   --   --   --   --   <  > 4.71*  --  3.78*  --  2.23*  --  1.92*  --   --  1.73* 1.61*  PHART 7.416  --  7.440  --  7.475*  --   --   --   --   --   --   --   --   --   --   --   --   PCO2ART 35.0  --  36.8  --  32.2*  --   --   --   --   --   --   --   --   --   --   --   --   PO2ART 154.0*  --  194.0*  --  130.0*  --   --   --   --   --   --   --   --   --   --   --   --   < > = values in this interval not displayed.  Recent Labs Lab 03/20/12 1820 03/20/12 1926 03/21/12 0037 03/21/12 0334 03/21/12 0742  GLUCAP 118* 95 103* 125* 125*    Imaging: Dg Retrograde Pyelogram  03/20/2012  *RADIOLOGY REPORT*  Clinical data:   Renal insufficiency and possible ureteral injury and urine leak after total colectomy.  INTRAOPERATIVE BILATERAL RETROGRADE UROGRAPHY  Comparison:  CT on 03/11/2012  Technique:  Images were obtained with the C-arm fluoroscopic device intraoperatively and submitted for interpretation post-operatively. Please see the procedural report for the amount of contrast and the fluoroscopy time utilized.  Findings:  Intraoperative imaging was performed with a C-arm.  This demonstrates cannulation of  both distal ureters via a cystoscope. Contrast injection and bilateral retrograde pyelography demonstrates normal patency of the ureters and collecting system bilaterally with no evidence of urine leak or obstruction.  IMPRESSION: Unremarkable bilateral retrograde pyelogram images demonstrating no evidence of ureteral injury or urine leak.   Original Report Authenticated By: Irish Lack, M.D.    Dg Chest Port 1 View  03/19/2012  *RADIOLOGY REPORT*  Clinical Data: Assess collapse.  PORTABLE CHEST - 1 VIEW  Comparison: 03/15/2012  Findings: Interval removal of the endotracheal tube.  Right and left central venous catheters and enteric tubes remain in place. Normal heart size and pulmonary vascularity.  There appears to be developing left pleural effusion with atelectasis or infiltration in the left lung base.  Minimal  residual atelectasis in the right lung base is stable.  No pneumothorax.  IMPRESSION: Developing small left pleural effusion with infiltration or atelectasis in the left lung base.  Mild atelectasis in the right lung base is stable.  Appliances appear to be in satisfactory location.   Original Report Authenticated By: Burman Nieves, M.D.    CXR 2/20 >mild hazziness rt base, ett wnl None new  ASSESSMENT / PLAN:  PULMONARY A: Acute respiratory failure in setting of septic shock and volume overload.  Lt pleural effusion.  Extubated 2/20  P:   IS Encourage cough Upright, mobilize  CARDIOVASCULAR A: Septic Shock - Secondary to C-diff colitis; off pressors 2/08. Restarted 2/13 (relatd to volume?) Atrial Fibrillation with RVR.> Low [place] score. High output from JP's remains 2/22 - high risk hypotension from volume depletion from ATN output and JP's Clot around picc, dvt rue 2/11 Shock reoccurrence, related to sepsis? Volume? 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 Hx of ulcerative colitis. cholestatic picture conjugating, related to sepsis, voluem status - improved 2/23, slow improvement R/o acalculous chole At risk sclerosis cholangitis  P:   Will attempt panda zantac Fees in am , ice chips Max protein, will recall nutrition again Lactulose, may consider to reduce Gi consult to assess liver enzymes that have not improved I will assess esr, ana, anti endomyos, dsdna Maintain steroids  HEMATOLOGIC A:   dvt picc aassociated rue 2/11 Small splenic hematoma? Thrombocytopenia , sepsis, HITT NEG  Dopplers legs - neg lowers P:  Plat rising, follow in am, if plat to 100 k would restart heparin drip If needed could re use heparin Cbc in am   INFECTIOUS A: C diff colitis, clinically progressing, Worsening rash r/o IMi?, r/o ureteral injury P:   cdiff treatment completed  ENDOCRINE A: Hyperglycemia. Controlled NICE wnl Relative adrenal insufficiency. P:   SSI Lantus, may need re increase, follow after Tf restarted Solucortef remain, may increase for rash   NEUROLOGIC A:  Acute encephalopathy R/o stroke vs crit illness myopathy / neuroapthy Will need MRI brain and neck, wait till safe for this P:   Aggressive pt may be limited with wound Lactulose on going, but reduce  RASH Started left groin treated myco, and thrived in this  setting May attempt topical antifungal and steroid as at risk immune suppresion  Consider to sdu    Wife updated   Mcarthur Rossetti. Tyson Alias, MD, FACP Pgr: [phone] [facility]

## 2012-03-21 NOTE — Clinical Social Work Psychosocial (Signed)
     Clinical Social Work Department BRIEF PSYCHOSOCIAL ASSESSMENT 03/21/2012  Patient:  Le, EGERTON     Account Number:  [id number]     Admit date:  02/26/2012  Clinical Social Worker:  Katrinka Blazing  Date/Time:  03/21/2012 12:00 M  Referred by:  Physician  Date Referred:  03/21/2012 Referred for  SNF Placement   Other Referral:   Interview type:  Family Other interview type:   Pt currently unable to fully participate in assessment.    PSYCHOSOCIAL DATA Living Status:  FAMILY Admitted from facility:   Level of care:   Primary support name:  Johan Creveling: [phone] Primary support relationship to patient:  SPOUSE Degree of support available:   Adequate.    CURRENT CONCERNS Current Concerns  Post-Acute Placement   Other Concerns:    SOCIAL WORK ASSESSMENT / PLAN Clinical Social Worker recieved referral indicating pt's potential need for SNF at dc.  CSW reviewed chart and met with pt's spouse.  CSW introduced self, explained role, and provided support.  CSW reviewed PT recommendations. Wife agreeable to SNF search and also expressed interest in CIR, if applicable.  CSW reviewed SNF process and requested PT/OT orders from MD.  CSW to begin SNF search.   Assessment/plan status:  Information/Referral to [employer] Other assessment/ plan:   Information/referral to community resources:   SNF  CIR    PATIENTS/FAMILYS RESPONSE TO PLAN OF CARE: Pt currently unable to fully participate in assessemnt. Wife was pleasnant and engaged in conversation.  Wife thanked CSW for intervention.

## 2012-03-22 ENCOUNTER — Inpatient Hospital Stay (HOSPITAL_COMMUNITY): Payer: BC Managed Care – PPO

## 2012-03-22 ENCOUNTER — Encounter (HOSPITAL_COMMUNITY): Payer: Self-pay | Admitting: Urology

## 2012-03-22 LAB — CBC WITH DIFFERENTIAL/PLATELET
Basophils Absolute: 0 10*3/uL (ref 0.0–0.1)
Basophils Relative: 0 % (ref 0–1)
Eosinophils Absolute: 0 10*3/uL (ref 0.0–0.7)
Hemoglobin: 9.4 g/dL — ABNORMAL LOW (ref 13.0–17.0)
Lymphocytes Relative: 4 % — ABNORMAL LOW (ref 12–46)
MCH: 31.6 pg (ref 26.0–34.0)
MCHC: 30.8 g/dL (ref 30.0–36.0)
Monocytes Absolute: 0.3 10*3/uL (ref 0.1–1.0)
Neutro Abs: 10 10*3/uL — ABNORMAL HIGH (ref 1.7–7.7)
Neutrophils Relative %: 93 % — ABNORMAL HIGH (ref 43–77)
RDW: 21.8 % — ABNORMAL HIGH (ref 11.5–15.5)

## 2012-03-22 LAB — PHOSPHORUS: Phosphorus: 3.7 mg/dL (ref 2.3–4.6)

## 2012-03-22 LAB — COMPREHENSIVE METABOLIC PANEL
BUN: 102 mg/dL — ABNORMAL HIGH (ref 6–23)
CO2: 18 mEq/L — ABNORMAL LOW (ref 19–32)
Calcium: 7.7 mg/dL — ABNORMAL LOW (ref 8.4–10.5)
Chloride: 120 mEq/L — ABNORMAL HIGH (ref 96–112)
Creatinine, Ser: 1.99 mg/dL — ABNORMAL HIGH (ref 0.50–1.35)
GFR calc Af Amer: 42 mL/min — ABNORMAL LOW (ref >90)
GFR calc non Af Amer: 37 mL/min — ABNORMAL LOW (ref >90)
Total Bilirubin: 16.9 mg/dL — ABNORMAL HIGH (ref 0.3–1.2)

## 2012-03-22 LAB — ENDOMYSIAL IGA ANTIBODY: Endomysial IgA Autoabs: NEGATIVE

## 2012-03-22 LAB — PROTIME-INR
INR: 1.7 — ABNORMAL HIGH (ref 0.00–1.49)
Prothrombin Time: 19.4 seconds — ABNORMAL HIGH (ref 11.6–15.2)

## 2012-03-22 LAB — GLUCOSE, CAPILLARY
Glucose-Capillary: 156 mg/dL — ABNORMAL HIGH (ref 70–99)
Glucose-Capillary: 139 mg/dL — ABNORMAL HIGH (ref 70–99)

## 2012-03-22 MED ORDER — HYDROCORTISONE SOD SUCCINATE 100 MG IJ SOLR
50.0000 mg | Freq: Two times a day (BID) | INTRAMUSCULAR | Status: DC
  Administered 2012-03-23: 50 mg via INTRAVENOUS

## 2012-03-22 NOTE — Progress Notes (Signed)
Patient ID: Connor Small, male   DOB: 13-Dec-1960, 52 y.o.   MRN: [id number] 2 Days Post-Op  Subjective: Pt sleeping.  Wife confused if patient had stents placed or not as one note says yes, but was told no.  Swallow study today.  Objective: Vital signs in last 24 hours: Temp:  [97.5 F (36.4 C)-98.4 F (36.9 C)] 97.8 F (36.6 C) (02/27 0337) Pulse Rate:  [96-109] 100 (02/27 0337) Resp:  [13-18] 17 (02/27 0337) BP: (83-107)/(55-66) 101/55 mmHg (02/27 0337) SpO2:  [97 %-100 %] 97 % (02/27 0337) Weight:  [186 lb 8.2 oz (84.6 kg)] 186 lb 8.2 oz (84.6 kg) (02/27 0337) Last BM Date: 03/20/12  Intake/Output from previous day: 02/26 0701 - 02/27 0700 In: 8261.2 [I.V.:6591.2; NG/GT:1670] Out: 3790 [Urine:1575; Drains:1315; Stool:900] Intake/Output this shift:    PE: Abd: soft, VAC in place, ileostomy with good output, +BS, NGT in place with TFs.  JPs are full with yellow serous output.  Lab Results:   Recent Labs  03/21/12 0433 03/22/12 0428  WBC 10.6* 10.7*  HGB 10.4* 9.4*  HCT 34.4* 30.5*  PLT 51* 44*   BMET  Recent Labs  03/21/12 0433 03/22/12 0428  NA 154* 147*  K 4.8 4.4  CL 128* 120*  CO2 16* 18*  GLUCOSE 141* 134*  BUN 115* 102*  CREATININE 2.03* 1.99*  CALCIUM 7.9* 7.7*   PT/INR  Recent Labs  03/21/12 0433 03/22/12 0428  LABPROT 18.6* 19.4*  INR 1.61* 1.70*   CMP     Component Value Date/Time   NA 147* 03/22/2012 0428   K 4.4 03/22/2012 0428   CL 120* 03/22/2012 0428   CO2 18* 03/22/2012 0428   GLUCOSE 134* 03/22/2012 0428   BUN 102* 03/22/2012 0428   CREATININE 1.99* 03/22/2012 0428   CALCIUM 7.7* 03/22/2012 0428   PROT 3.4* 03/22/2012 0428   ALBUMIN 1.6* 03/22/2012 0428   AST 281* 03/22/2012 0428   ALT 185* 03/22/2012 0428   ALKPHOS 680* 03/22/2012 0428   BILITOT 16.9* 03/22/2012 0428   GFRNONAA 37* 03/22/2012 0428   GFRAA 42* 03/22/2012 0428   Lipase     Component Value Date/Time   LIPASE 9* 02/26/2012 1409       Studies/Results: Dg Shoulder  Right  03/21/2012  *RADIOLOGY REPORT*  Clinical Data: Evaluate dislocation  RIGHT SHOULDER - 2+ VIEW  Comparison: Chest radiograph - 03/19/2012  Findings:  Examination degraded secondary to obliquity and only providing a solitary view only providing a solitary slightly oblique AP view.  No definite fracture.  There is inferior subluxation of the humeral head in relation to the glenoid.  No definite anterior/inferior dislocation, though a posterior dislocation is not excluded on the basis of this examination.  Limited visualization of the adjacent right hemithorax suggests a layering small right-sided pleural effusion with associated right basilar opacities.  Large bore right jugular approach central venous catheter tip projects over the mid SVC.  The distal end of the known left jugular central venous catheter tip also projects over the mid SVC. A portion of the mid aspect of enteric tube is visualized.  IMPRESSION: Degraded examination demonstrates inferior subluxation of the humeral head in relation to the glenoid.  While there is no definitive anterior/inferior shoulder dislocation.   While a posterior shoulder dislocation is not excluded on the basis of this examination, a posterior dislocation is unlikely in the absence of recent seizure activity or trauma. Clinical correlation is advised.   Original Report Authenticated By: Jonny Ruiz  Judithann Sheen, MD    Dg Retrograde Pyelogram  03/20/2012  *RADIOLOGY REPORT*  Clinical data:   Renal insufficiency and possible ureteral injury and urine leak after total colectomy.  INTRAOPERATIVE BILATERAL RETROGRADE UROGRAPHY  Comparison:  CT on 03/11/2012  Technique:  Images were obtained with the C-arm fluoroscopic device intraoperatively and submitted for interpretation post-operatively. Please see the procedural report for the amount of contrast and the fluoroscopy time utilized.  Findings:  Intraoperative imaging was performed with a C-arm.  This demonstrates cannulation of both  distal ureters via a cystoscope. Contrast injection and bilateral retrograde pyelography demonstrates normal patency of the ureters and collecting system bilaterally with no evidence of urine leak or obstruction.  IMPRESSION: Unremarkable bilateral retrograde pyelogram images demonstrating no evidence of ureteral injury or urine leak.   Original Report Authenticated By: Irish Lack, M.D.     Anti-infectives: Anti-infectives   Start     Dose/Rate Route Frequency Ordered Stop   03/20/12 1145  ciprofloxacin (CIPRO) IVPB 400 mg    Comments:  ON CALL TO OR   400 mg 200 mL/hr over 60 Minutes Intravenous  Once 03/20/12 1143 03/20/12 1722   03/18/12 1000  vancomycin (VANCOCIN) 1,500 mg in sodium chloride 0.9 % 500 mL IVPB  Status:  Discontinued     1,500 mg 250 mL/hr over 120 Minutes Intravenous Every 48 hours 03/17/12 1920 03/19/12 0840   03/14/12 1000  micafungin (MYCAMINE) 100 mg in sodium chloride 0.9 % 100 mL IVPB  Status:  Discontinued     100 mg 100 mL/hr over 1 Hours Intravenous Daily 03/13/12 0632 03/19/12 0840   03/13/12 1000  micafungin (MYCAMINE) 100 mg in sodium chloride 0.9 % 100 mL IVPB     100 mg 100 mL/hr over 1 Hours Intravenous Daily 03/13/12 0632 03/13/12 1311   03/12/12 1800  vancomycin (VANCOCIN) IVPB 1000 mg/200 mL premix  Status:  Discontinued     1,000 mg 200 mL/hr over 60 Minutes Intravenous Every 24 hours 03/12/12 0859 03/17/12 1017   03/12/12 1000  imipenem-cilastatin (PRIMAXIN) 500 mg in sodium chloride 0.9 % 100 mL IVPB  Status:  Discontinued     500 mg 200 mL/hr over 30 Minutes Intravenous 3 times per day 03/12/12 0848 03/12/12 0909   03/12/12 1000  imipenem-cilastatin (PRIMAXIN) 250 mg in sodium chloride 0.9 % 100 mL IVPB  Status:  Discontinued     250 mg 200 mL/hr over 30 Minutes Intravenous 4 times per day 03/12/12 0909 03/19/12 0841   03/11/12 1200  vancomycin (VANCOCIN) 750 mg in sodium chloride 0.9 % 150 mL IVPB  Status:  Discontinued     750 mg 150 mL/hr  over 60 Minutes Intravenous Every 12 hours 03/11/12 0204 03/12/12 0858   03/11/12 0215  vancomycin (VANCOCIN) 750 mg in sodium chloride 0.9 % 150 mL IVPB     750 mg 150 mL/hr over 60 Minutes Intravenous  Once 03/11/12 0204 03/11/12 0352   03/11/12 0215  aztreonam (AZACTAM) 1 g in dextrose 5 % 50 mL IVPB  Status:  Discontinued     1 g 100 mL/hr over 30 Minutes Intravenous 3 times per day 03/11/12 0204 03/12/12 0851   03/05/12 2200  metroNIDAZOLE (FLAGYL) IVPB 500 mg  Status:  Discontinued    Comments:  First dose ASAP   500 mg 100 mL/hr over 60 Minutes Intravenous Every 8 hours 03/05/12 1346 03/19/12 1509   03/02/12 1200  vancomycin (VANCOCIN) 50 mg/mL oral solution 500 mg  Status:  Discontinued  500 mg Oral 4 times per day 03/02/12 0913 03/19/12 1509   03/01/12 1445  vancomycin (VANCOCIN) 50 mg/mL oral solution 500 mg  Status:  Discontinued     500 mg Oral 3 times per day 03/01/12 1340 03/02/12 0913   03/01/12 1400  aztreonam (AZACTAM) 1 g in dextrose 5 % 50 mL IVPB  Status:  Discontinued     1 g 100 mL/hr over 30 Minutes Intravenous 3 times per day 03/01/12 1356 03/02/12 1146   03/01/12 1300  vancomycin (VANCOCIN) 1,250 mg in sodium chloride 0.9 % 250 mL IVPB  Status:  Discontinued     1,250 mg 166.7 mL/hr over 90 Minutes Intravenous Every 12 hours 03/01/12 1127 03/02/12 1146   02/29/12 0600  ciprofloxacin (CIPRO) IVPB 400 mg     400 mg 200 mL/hr over 60 Minutes Intravenous On call to O.R. 02/28/12 1409 02/28/12 1438   02/28/12 2200  vancomycin (VANCOCIN) 500 mg in sodium chloride irrigation 0.9 % 60 mL ENEMA  Status:  Discontinued     500 mg Rectal 2 times daily 02/28/12 1504 02/28/12 1817   02/28/12 1200  vancomycin (VANCOCIN) 50 mg/mL oral solution 500 mg  Status:  Discontinued    Comments:  First dose ASAP   500 mg Per Tube 4 times per day 02/28/12 0814 02/28/12 1817   02/28/12 1000  vancomycin (VANCOCIN) IVPB 1000 mg/200 mL premix  Status:  Discontinued     1,000 mg 200 mL/hr  over 60 Minutes Intravenous Every 12 hours 02/28/12 0845 03/01/12 1127   02/28/12 1000  vancomycin (VANCOCIN) 500 mg in sodium chloride irrigation 0.9 % 100 mL ENEMA  Status:  Discontinued     500 mg Rectal 2 times daily 02/28/12 0923 02/28/12 1504   02/27/12 1600  metroNIDAZOLE (FLAGYL) IVPB 500 mg  Status:  Discontinued     500 mg 100 mL/hr over 60 Minutes Intravenous Every 8 hours 02/27/12 1546 02/27/12 2000   02/27/12 1600  ciprofloxacin (CIPRO) IVPB 400 mg  Status:  Discontinued     400 mg 200 mL/hr over 60 Minutes Intravenous Every 12 hours 02/27/12 1547 02/27/12 2000   02/27/12 1545  metroNIDAZOLE (FLAGYL) IVPB 500 mg  Status:  Discontinued     500 mg 100 mL/hr over 60 Minutes Intravenous  Once 02/27/12 1535 02/28/12 0843   02/27/12 1545  vancomycin (VANCOCIN) 500 mg in sodium chloride 0.9 % 100 mL IVPB  Status:  Discontinued     500 mg 100 mL/hr over 60 Minutes Intravenous  Once 02/27/12 1535 02/27/12 1538   02/27/12 1545  ciprofloxacin (CIPRO) IVPB 400 mg  Status:  Discontinued     400 mg 200 mL/hr over 60 Minutes Intravenous  Once 02/27/12 1538 02/28/12 0843   02/27/12 1415  vancomycin (VANCOCIN) 500 mg in sodium chloride 0.9 % 100 mL IVPB     500 mg 100 mL/hr over 60 Minutes Intravenous  Once 02/27/12 1402 02/27/12 1411   02/27/12 1400  vancomycin (VANCOCIN) powder 500 mg  Status:  Discontinued     500 mg Other To Surgery 02/27/12 1359 02/27/12 1402   02/27/12 1200  metroNIDAZOLE (FLAGYL) IVPB 500 mg  Status:  Discontinued    Comments:  First dose ASAP   500 mg 100 mL/hr over 60 Minutes Intravenous Every 6 hours 02/27/12 0953 03/05/12 1346   02/27/12 1200  vancomycin (VANCOCIN) 50 mg/mL oral solution 250 mg  Status:  Discontinued    Comments:  First dose ASAP   250 mg Oral  4 times per day 02/27/12 0953 02/28/12 0814   02/26/12 1700  oseltamivir (TAMIFLU) capsule 75 mg     75 mg Oral  Once 02/26/12 1651 02/26/12 1715       Assessment/Plan  1. S/p subtotal colectomy with  ileostomy 2. VDRF, now extubated 3. PCM/TF 4. Small wound dehiscence  Plan: 1. Will look at wound tomorrow with VAC change. 2. CONFIRMED WITH DR. MANNY THAT PATIENT DOES NOT HAVE STENTS! 3. No evidence of ureteral injury  4. Await swallow study to see if patient can eat, if not he is getting his tube changed out for a PANDA tube it sounds like.  LOS: 25 days    Leatha Rohner E 03/22/2012, 8:04 AM Pager: [phone]

## 2012-03-22 NOTE — Progress Notes (Signed)
Subjective: Interval History: none.  Objective: Vital signs in last 24 hours: Temp:  [97.5 F (36.4 C)-98.4 F (36.9 C)] 97.7 F (36.5 C) (02/27 0807) Pulse Rate:  [82-108] 82 (02/27 0807) Resp:  [13-18] 16 (02/27 0807) BP: (89-107)/(55-66) 103/59 mmHg (02/27 0807) SpO2:  [97 %-100 %] 99 % (02/27 0807) Weight:  [84.6 kg (186 lb 8.2 oz)] 84.6 kg (186 lb 8.2 oz) (02/27 0337) Weight change: 3.2 kg (7 lb 0.9 oz)  Intake/Output from previous day: 02/26 0701 - 02/27 0700 In: 8431.2 [I.V.:6761.2; NG/GT:1670] Out: 5329 [Urine:1575; Drains:1315; Stool:900] Intake/Output this shift: Total I/O In: 640 [I.V.:640] Out: 500 [Urine:300; Drains:200]  General appearance: icteric and slowed mentation Resp: diminished breath sounds bilaterally and rales bibasilar Cardio: S1, S2 normal and systolic murmur: holosystolic 2/6, blowing at apex GI: pos bs, midline vac. drain lower abdm, ostomy R mid abdm Extremities: edema 3+  Lab Results:  Recent Labs  03/21/12 0433 03/22/12 0428  WBC 10.6* 10.7*  HGB 10.4* 9.4*  HCT 34.4* 30.5*  PLT 51* 44*   BMET:  Recent Labs  03/21/12 0433 03/22/12 0428  NA 154* 147*  K 4.8 4.4  CL 128* 120*  CO2 16* 18*  GLUCOSE 141* 134*  BUN 115* 102*  CREATININE 2.03* 1.99*  CALCIUM 7.9* 7.7*   No results found for this basename: PTH,  in the last 72 hours Iron Studies: No results found for this basename: IRON, TIBC, TRANSFERRIN, FERRITIN,  in the last 72 hours  Studies/Results: Dg Shoulder Right  03/21/2012  *RADIOLOGY REPORT*  Clinical Data: Evaluate dislocation  RIGHT SHOULDER - 2+ VIEW  Comparison: Chest radiograph - 03/19/2012  Findings:  Examination degraded secondary to obliquity and only providing a solitary view only providing a solitary slightly oblique AP view.  No definite fracture.  There is inferior subluxation of the humeral head in relation to the glenoid.  No definite anterior/inferior dislocation, though a posterior dislocation is not  excluded on the basis of this examination.  Limited visualization of the adjacent right hemithorax suggests a layering small right-sided pleural effusion with associated right basilar opacities.  Large bore right jugular approach central venous catheter tip projects over the mid SVC.  The distal end of the known left jugular central venous catheter tip also projects over the mid SVC. A portion of the mid aspect of enteric tube is visualized.  IMPRESSION: Degraded examination demonstrates inferior subluxation of the humeral head in relation to the glenoid.  While there is no definitive anterior/inferior shoulder dislocation.   While a posterior shoulder dislocation is not excluded on the basis of this examination, a posterior dislocation is unlikely in the absence of recent seizure activity or trauma. Clinical correlation is advised.   Original Report Authenticated By: Jake Seats, MD    Dg Retrograde Pyelogram  03/20/2012  *RADIOLOGY REPORT*  Clinical data:   Renal insufficiency and possible ureteral injury and urine leak after total colectomy.  INTRAOPERATIVE BILATERAL RETROGRADE UROGRAPHY  Comparison:  CT on 03/11/2012  Technique:  Images were obtained with the C-arm fluoroscopic device intraoperatively and submitted for interpretation post-operatively. Please see the procedural report for the amount of contrast and the fluoroscopy time utilized.  Findings:  Intraoperative imaging was performed with a C-arm.  This demonstrates cannulation of both distal ureters via a cystoscope. Contrast injection and bilateral retrograde pyelography demonstrates normal patency of the ureters and collecting system bilaterally with no evidence of urine leak or obstruction.  IMPRESSION: Unremarkable bilateral retrograde pyelogram images demonstrating no  evidence of ureteral injury or urine leak.   Original Report Authenticated By: Aletta Edouard, M.D.     I have reviewed the patient's current  medications.  Assessment/Plan: 1 AKI vol xs.  Acidemia and solute some better.  Needs less fluid. 2 Anemia 3 ^LFTs, etio not clear ? uc assoc cholangitis 4 Abscess 5 Weakness 6 Slowed mentation. P iv with bicarb and water, follow HB, BI input    LOS: 25 days   Raynell Upton L 03/22/2012,11:04 AM

## 2012-03-22 NOTE — Progress Notes (Signed)
Patient ID: Connor Small, male   DOB: 12/21/1960, 52 y.o.   MRN: [id number]         [facility] [place] [facility]    Date of Admission:  02/26/2012     Principal Problem:   Leukocytosis, unspecified Active Problems:   C. difficile colitis   Septic shock   Hyperbilirubinemia   UC (ulcerative colitis)   Colonic stricture   S/P cecostomy   ARF (acute renal failure)   Sinus tachycardia   Hematuria   Acute respiratory failure   Pleural effusion   Thrombocytopenia, unspecified   Atrial fibrillation   Anemia   Quadriparesis   . antiseptic oral rinse  15 mL Mouth Rinse q12n4p  . chlorhexidine  15 mL Mouth Rinse BID  . free water  200 mL Per Tube QID  . hydrocortisone sodium succinate  50 mg Intravenous Q6H  . insulin aspart  0-20 Units Subcutaneous Q4H  . insulin glargine  45 Units Subcutaneous Daily  . lactulose  30 g Per Tube Daily  . nystatin-triamcinolone   Topical BID  . ranitidine  150 mg Per Tube BID  . sodium bicarbonate  1,300 mg Oral TID  . sodium chloride  10-40 mL Intracatheter Q12H   Objective: Temp:  [97.5 F (36.4 C)-98.4 F (36.9 C)] 98.1 F (36.7 C) (02/27 1222) Pulse Rate:  [82-106] 84 (02/27 1222) Resp:  [13-18] 15 (02/27 1222) BP: (89-107)/(55-66) 93/56 mmHg (02/27 1222) SpO2:  [97 %-100 %] 98 % (02/27 1222) Weight:  [84.6 kg (186 lb 8.2 oz)] 84.6 kg (186 lb 8.2 oz) (02/27 0337)  General: He is sitting up in bed watching television. He nods slowly but correctly to questions. Skin: Erythematous rash on abdomen and thighs has decreased  Lab Results Lab Results  Component Value Date   WBC 10.7* 03/22/2012   HGB 9.4* 03/22/2012   HCT 30.5* 03/22/2012   MCV 102.7* 03/22/2012   PLT 44* 03/22/2012    Assessment: No evidence of active infection.  Plan: 1. Continue observation off of antibiotics 2. Please call if I can be of further assistance while he is here  Michel Bickers, MD [facility] for [place] [phone] pager   [phone] cell 03/22/2012, 1:24 PM

## 2012-03-22 NOTE — Progress Notes (Signed)
PULMONARY  / CRITICAL CARE MEDICINE  Name: Connor Small MRN: [id number] DOB: [birth year]    ADMISSION DATE:  02/26/2012 CONSULTATION DATE:  2/5  REFERRING MD :  Karilyn Cota  CHIEF COMPLAINT:  Acute resp failure   BRIEF PATIENT DESCRIPTION:  [name] admitted to [facility] for Cdiff on 2/2. Underwent decompressive cecostomy 2/3, initially was better, then worsened on 2/4 with increased abdominal distension and tenderness, decreased renal function.  He was taken back to the OR on 2/4 for total abdominal colectomy and end ileostomy.  Post op he developed ARDS and sepsis shock and was being treated for this.  The morning of 2/6 he had new onset of A-fib and transferred to [facility].    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 2/6 - New A-fib and transfer to [facility] health 2/9- thora left 1200 exudative 2/10 CT head - Old infarction in the right cerebellum and in the left frontal parietal white matter 2/10- CT abd/pelvis- small hematoma likely subcapsular spleen, JPs wnl 2/11-borderline BP, pos balance, tube feeds started 2/11 clot around picc, picc dc'ed 2/12-continued borderline BP, neg balance 1 liter 2/14- neo required, 1.2 liters neg, air hunger, changed to PS  2/15 -neg 4 l bal on lasix drip, lasix d/c  2/16- poor neurostatus, ct head neg 2/17- cvvhd, high pressor needs 2/18- some slight reduction in pressors 2/19 [place] abd (repeat): No explanation for elevated liver function tests 2/20- reduction pressors, improved alertness 2/21- bleeding overnight from JP's, argatroban turned off, lower pressor needs 2/22- off pressors, on RA 2/23- neg from JPs again, inr reducing 2/24- concern dehiscence , some improved renal fxn with increased volume 2/24- possible dye change in output after dye 1 hr  2/25 Cystoscopy with bilateral retrograde pyelograms. No extravasation of  contrast from either ureteral orifice. No evidence of hydroureteronephrosis. Unremarkable urinary bladder. B ureteral stents placed 2/27 MRI brain: Scattered small acute to subacute infarcts in both cerebral hemispheres, brainstem, and right cerebellum. Favor sequelae of emboli from a cardiac or proximal aortic source. Main alternate consideration is hypotensive episode 2/27 MRCP >>   LINES / TUBES: ETT - OSH 2/4>>> 2/20 PICC OSH 2/4>>>2/11 Art Line - OSH 2/5>>>out A line rt fem 2/18>>>2/24 Rt ij HD 2/17>> out Left IJ 2/18 >>   CULTURES: BCx2 2/4>>>negative BCx2 2/3>>>negative UC 2/3- Negative MRSA PCR 2/3- Negative C diff 2/6>>>Positive Body fluid 2/9>>>WBCs, no organisms 2/15 BC x 2 >> NEG 2/15 Sputum >>>NF  ANTIBIOTICS: Flagyl 2/3>> d/c'd Vanc 2/6 (IV) >>>2/7 azactam 2/6>>>2/7 Oral vanc 2/6>>> IV vanc 2/16 >2/24 Aztreonam 2/16 >2/17 Imipenem 2/17>>>2/24 mycofungin 2/18>>>2/24  SUBJECTIVE:  Lethargic. No distress  VITAL SIGNS: Temp:  [97.5 F (36.4 C)-98.3 F (36.8 C)] 98.3 F (36.8 C) (02/27 1747) Pulse Rate:  [82-100] 88 (02/27 1747) Resp:  [10-18] 10 (02/27 1747) BP: (93-107)/(55-64) 94/61 mmHg (02/27 1747) SpO2:  [97 %-99 %] 97 % (02/27 1747) Weight:  [84.6 kg (186 lb 8.2 oz)] 84.6 kg (186 lb 8.2 oz) (02/27 0337) HEMODYNAMICS:   VENTILATOR SETTINGS:   INTAKE / OUTPUT: Intake/Output     02/26 0701 - 02/27 0700 02/27 0701 - 02/28 0700   I.V. (mL/kg) 6761.2 (79.9) 2000 (23.6)   NG/GT 1670    IV Piggyback     Total Intake(mL/kg) 8431.2 (99.7) 2000 (23.6)   Urine (mL/kg/hr) 1575 (0.8) 950 (0.9)   Drains 1315 (  0.6) 835 (0.8)   Stool 900 (0.4) 100 (0.1)   Total Output 3790 1885   Net +4641.2 +115          PHYSICAL EXAMINATION: General awake, alert Neuro: awake, unchanged weakness rue, limited shoulder movement HEENT: lines clean neck Cardiovascular: s1 s2 rrr Lungs: clear anterior Abdomen: mild distention, ostomy appears ok , wound  vac Musculoskeletal: 2+ edema about unchanged last 24 hrs Skin: follicular mild anterior rt thigh rash unchanged  LABS:  Recent Labs Lab 03/17/12 0420  03/18/12 0503  03/19/12 0440  03/19/12 2230 03/20/12 0447 03/21/12 0433 03/22/12 0428  HGB 9.1*  --  10.2*  --  9.2*  --  9.7* 10.1* 10.4* 9.4*  WBC 15.2*  --  12.0*  --  10.8*  --  9.6 10.0 10.6* 10.7*  PLT 30*  --  35*  --  36*  --  35* 38* 51* 44*  NA 144  --  150*  < > 153*  < > 153* 152* 154* 147*  K 3.5  --  2.9*  < > 3.4*  < > 4.4 4.2 4.8 4.4  CL 108  --  114*  < > 123*  < > 125* 123* 128* 120*  CO2 20  --  22  < > 20  < > 18* 18* 16* 18*  GLUCOSE 165*  --  181*  < > 162*  < > 242* 202* 141* 134*  BUN 135*  --  150*  < > 138*  < > 126* 116* 115* 102*  CREATININE 2.63*  --  2.61*  < > 2.37*  < > 2.10* 2.06* 2.03* 1.99*  CALCIUM 7.8*  --  7.6*  < > 7.8*  < > 7.6* 7.9* 7.9* 7.7*  MG 3.1*  --   --   --   --   --  2.5 2.5  --   --   PHOS 6.7*  --  5.8*  --  4.2  --  3.9 3.6 4.5 3.7  AST  --   --   --   --   --   --   --  193* 210* 281*  ALT  --   --   --   --   --   --   --  101* 120* 185*  ALKPHOS  --   --   --   --   --   --   --  592* 649* 680*  BILITOT  --   < >  --   --   --   --   --  14.9* 16.7* 16.9*  PROT  --   --   --   --   --   --   --  3.8* 3.7* 3.4*  ALBUMIN 2.5*  --  2.2*  --  2.2*  --   --  1.9* 1.8* 1.6*  APTT 63*  --  43*  --  35  --   --   --   --  31  INR 3.78*  --  2.23*  --  1.92*  --   --  1.73* 1.61* 1.70*  < > = values in this interval not displayed.  Recent Labs Lab 03/21/12 1956 03/21/12 2326 03/22/12 0336 03/22/12 0749 03/22/12 1213  GLUCAP 146* 156* 139* 112* 103*    Imaging: Dg Shoulder Right  03/21/2012  *RADIOLOGY REPORT*  Clinical Data: Evaluate dislocation  RIGHT SHOULDER - 2+ VIEW  Comparison: Chest radiograph - 03/19/2012  Findings:  Examination degraded secondary to obliquity and only providing a solitary view only providing a solitary slightly oblique AP view.  No definite  fracture.  There is inferior subluxation of the humeral head in relation to the glenoid.  No definite anterior/inferior dislocation, though a posterior dislocation is not excluded on the basis of this examination.  Limited visualization of the adjacent right hemithorax suggests a layering small right-sided pleural effusion with associated right basilar opacities.  Large bore right jugular approach central venous catheter tip projects over the mid SVC.  The distal end of the known left jugular central venous catheter tip also projects over the mid SVC. A portion of the mid aspect of enteric tube is visualized.  IMPRESSION: Degraded examination demonstrates inferior subluxation of the humeral head in relation to the glenoid.  While there is no definitive anterior/inferior shoulder dislocation.   While a posterior shoulder dislocation is not excluded on the basis of this examination, a posterior dislocation is unlikely in the absence of recent seizure activity or trauma. Clinical correlation is advised.   Original Report Authenticated By: Tacey Ruiz, MD    Mr [facility] Wo Contrast  03/22/2012  *RADIOLOGY REPORT*  Clinical Data:  52 year old male with altered mental status. Decreased right side movement.  Comparison: Head CTs 03/11/2012 and earlier.  MRI HEAD WITHOUT CONTRAST  Technique: Multiplanar, multiecho pulse sequences of the brain and surrounding structures were obtained according to standard protocol without intravenous contrast.  Findings: There are at least 14 small foci of restricted diffusion in the brain.  Seven of these are in the left hemisphere, mostly the left MCA / PCA watershed areas.  The remaining foci are identified in the right occipital and temporal lobes, right brainstem, and right cerebellar hemisphere.  Associated T2 and FLAIR hyperintensity at these areas without mass effect. No acute intracranial hemorrhage identified.  Major intracranial vascular flow voids are preserved with dominant  distal left vertebral artery.  MRA findings below.  No ventriculomegaly.  No intracranial mass effect or midline shift. Negative pituitary, cervicomedullary junction and visualized cervical spine.  Outside of the acute findings described above, there is only mild nonspecific cerebral white matter T2 and FLAIR hyperintensity.  Pulsation artifacts suspected in the cerebellum on the T1-weighted images.  Heterogeneous bone marrow signal without discrete destructive osseous lesion such as due to chronic disease or anemia.  Small mastoid effusions.  Small volume retained secretions in the nasopharynx.  Negative paranasal sinuses. Visualized orbit soft tissues are within normal limits.   Negative scalp soft tissues.  IMPRESSION: 1.  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  Favor sequelae of emboli from a cardiac or proximal aortic source.  Main alternate consideration is hypotensive episode. 2.  No mass effect or hemorrhage. 3.  MRA findings are below. 4.  Mastoid effusions and small volume retained secretions in the pharynx.  MRA HEAD WITHOUT CONTRAST  Technique: Angiographic images of the Circle of Willis were obtained using MRA technique without  intravenous contrast.  Findings: Study is moderately degraded by motion artifact despite repeated imaging attempts.  Antegrade flow in the dominant distal left vertebral artery.  There is antegrade flow detected in the nondominant distal right vertebral artery which probably functionally terminates in PICA. The left vertebral artery supplies the basilar which is patent.  No definite basilar stenosis.  Left AICA, bilateral SCA and bilateral PCA origins are patent.  Right larger than left posterior communicating arteries are evident.  Bilateral PCA branches are within normal limits.  Antegrade flow in both ICA siphons. No ICA stenosis identified. Ophthalmic artery origins and posterior communicating artery origins  are within normal limits.  Carotid termini are patent.  MCA and ACA origins are within normal limits.  Diminutive anterior communicating artery.  Proximal ACA and MCA branches are patent. Distal branches are not well evaluated.  IMPRESSION:  No major circle of Willis branch occlusion or proximal intracranial stenosis identified.  Degraded by motion despite repeated imaging attempts.   Original Report Authenticated By: Erskine Speed, M.D.    Mr Brain Wo Contrast  03/22/2012  *RADIOLOGY REPORT*  Clinical Data:  52 year old male with altered mental status. Decreased right side movement.  Comparison: Head CTs 03/11/2012 and earlier.  MRI HEAD WITHOUT CONTRAST  Technique: Multiplanar, multiecho pulse sequences of the brain and surrounding structures were obtained according to standard protocol without intravenous contrast.  Findings: There are at least 14 small foci of restricted diffusion in the brain.  Seven of these are in the left hemisphere, mostly the left MCA / PCA watershed areas.  The remaining foci are identified in the right occipital and temporal lobes, right brainstem, and right cerebellar hemisphere.  Associated T2 and FLAIR hyperintensity at these areas without mass effect. No acute intracranial hemorrhage identified.  Major intracranial vascular flow voids are preserved with dominant distal left vertebral artery.  MRA findings below.  No ventriculomegaly.  No intracranial mass effect or midline shift. Negative pituitary, cervicomedullary junction and visualized cervical spine.  Outside of the acute findings described above, there is only mild nonspecific cerebral white matter T2 and FLAIR hyperintensity.  Pulsation artifacts suspected in the cerebellum on the T1-weighted images.  Heterogeneous bone marrow signal without discrete destructive osseous lesion such as due to chronic disease or anemia.  Small mastoid effusions.  Small volume retained secretions in the nasopharynx.  Negative paranasal sinuses.  Visualized orbit soft tissues are within normal limits.   Negative scalp soft tissues.  IMPRESSION: 1.  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  Favor sequelae of emboli from a cardiac or proximal aortic source.  Main alternate consideration is hypotensive episode. 2.  No mass effect or hemorrhage. 3.  MRA findings are below. 4.  Mastoid effusions and small volume retained secretions in the pharynx.  MRA HEAD WITHOUT CONTRAST  Technique: Angiographic images of the Circle of Willis were obtained using MRA technique without  intravenous contrast.  Findings: Study is moderately degraded by motion artifact despite repeated imaging attempts.  Antegrade flow in the dominant distal left vertebral artery.  There is antegrade flow detected in the nondominant distal right vertebral artery which probably functionally terminates in PICA. The left vertebral artery supplies the basilar which is patent.  No definite basilar stenosis.  Left AICA, bilateral SCA and bilateral PCA origins are patent.  Right larger than left posterior communicating arteries are evident.  Bilateral PCA branches are within normal limits.  Antegrade flow in both ICA siphons. No ICA stenosis identified. Ophthalmic artery origins and posterior communicating artery origins are within normal limits.  Carotid termini are patent.  MCA and ACA origins are within normal limits.  Diminutive anterior communicating artery.  Proximal ACA and MCA branches are patent. Distal branches are not well evaluated.  IMPRESSION:  No major circle of Willis branch occlusion or proximal intracranial stenosis identified.  Degraded by motion despite repeated imaging attempts.   Original Report Authenticated By: Odessa Fleming III,  M.D.    CXR: None new  ASSESSMENT / PLAN:  PULMONARY A: Acute respiratory failure, resolved.  Lt pleural effusion.   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 Hx of ulcerative colitis. Hyperbilirubinemia due to cholestasis   P:   F/U MRCP Swallow eval 2/28 and resume nutrition   HEMATOLOGIC A:  Small splenic hematoma Thrombocytopenia , HITT NEG   P:  Monitor  INFECTIOUS A: C diff colitis, treated P:   Monitor off abx   ENDOCRINE A: Hyperglycemia. Controlled NICE wnl Relative adrenal insufficiency. 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  After 5:30 PM or weekends, call [phone]

## 2012-03-22 NOTE — Progress Notes (Signed)
Seen and agree with above.  Connor Small. Dahlia Bailiff, MD, [place] [phone] [facility]

## 2012-03-22 NOTE — Progress Notes (Signed)
Clinical Education officer, museum (CSW) met with pt wife and provided bed offers. Pt wife states she is very interested in CIR placement once pt is medically ready. CSW provided education regarding the difference between CIR/ SNF, wife agreeable to SNF as a back-up but very hopeful for CIR. CSW spoke with Pamala Hurry with CIR who remains following pt. CSW has also left a message with [facility] to see if a bed offer can be made (do not believe [facility] accepts [employer]BS.) CSW will remain following to assist with dc plans.  Hunt Oris, MSW, [place]

## 2012-03-22 NOTE — Progress Notes (Signed)
PT Cancellation Note  Patient Details Name: Connor Small MRN: [id number] DOB: 1960/05/21   Cancelled Treatment:    Reason Eval/Treat Not Completed: Patient at procedure or test/unavailable (pt at MRI)   Barbarann Ehlers. [place], [place], DPT [phone]   03/22/2012, 2:56 PM

## 2012-03-22 NOTE — Progress Notes (Signed)
OT PROGRESS NOTE  03/21/12 1710  OT Visit Information  Last OT Received On 03/21/12  Assistance Needed +2  PT/OT Co-Evaluation/Treatment Yes  OT Time Calculation  OT Start Time 1610  OT Stop Time 1703  OT Time Calculation (min) 53 min  Precautions  Precautions Fall  Precaution Comments contact  ADL  Eating/Feeding NPO  ADL Comments Focus of session on bed mobility and increasing sitting toleracne EOB with facilitating trunk control. Pt with apparent proximal weakness @ shoulders and hips. Pt required Max A with sitting. forward head. Able to extend head to kidline but unable to hold/maintain. Educated wife on retrograde massage B hands and importance of elevation. also discussed actively fisting and extending digits.   Cognition  Overall Cognitive Status Impaired  Cognition - Other Comments improved cognition.  Ansering questions verbally. Fatigue affects cognition at this time.  Bed Mobility  Bed Mobility Rolling Right;Rolling Left;Left Sidelying to Sit;Sitting - Scoot to [employer] of Bed;Sit to Sidelying Left  Rolling Right 1: +2 Total assist  Rolling Right: Patient Percentage 10%  Rolling Left 1: +2 Total assist  Rolling Left: Patient Percentage 10%  Left Sidelying to Sit 1: +2 Total assist;HOB flat  Left Sidelying to Sit: Patient Percentage 10%  Sitting - Scoot to Edge of Bed 1: +2 Total assist  Sitting - Scoot to Edge of Bed: Patient Percentage 0%  Sit to Supine 1: +2 Total assist  Sit to Supine: Patient Percentage 10% (or less)  Details for Bed Mobility Assistance vc/tc's for direction/normalized movement; significant truncal assist/support  Static Sitting Balance  Static Sitting - Balance Support Feet supported;Bilateral upper extremity supported (arms suppored, but not full weight bearing)  Static Sitting - Level of Assistance 1: +1 Total assist;2: Max assist  Dynamic Sitting Balance  Dynamic Sitting - Balance Support During functional activity;Feet supported;Left upper  extremity supported;Right upper extremity supported  Dynamic Sitting - Level of Assistance 1: +1 Total assist;1: +2 Total assist (for w/shift and reaching/leaning out of BOS)  Exercises  Exercises Other exercises  General Exercises - Upper Extremity  Shoulder Flexion AAROM;Both;5 reps;Supine  Shoulder ABduction AAROM;Both;5 reps;Supine  Elbow Flexion AAROM;Both;5 reps;Supine  Elbow Extension AAROM;Both;5 reps;Supine  Wrist Flexion AAROM;Both;5 reps;Supine  OT - End of Session  Activity Tolerance Patient limited by fatigue  Patient left in bed;with call bell/phone within reach  Nurse Communication Mobility status;Other (comment)  OT Assessment/Plan  Comments on Treatment Session Pt making progress. Able to sit EOB today with Max A. Pt demonstrtes prosimal weakness @ shoulder girlde and pelvic girdle, indicative of apparent myopathy. B shoulders are inferiorly subluxed;however, no pain is associated with subluxation and able to approximate humeral heads without difficulty. Important for staff to keep ppilloows under B elbows at this time to reduce stretch on shoulder capsules. Will continue to follow.  OT Plan Discharge plan remains appropriate  OT Frequency Min 3X/week  Recommendations for Other Services Rehab consult  Follow Up Recommendations CIR  OT Equipment 3 in 1 bedside comode;Tub/shower bench  Acute Rehab OT Goals  OT Goal Formulation Patient unable to participate in goal setting  Time For Goal Achievement 04/02/12  Potential to Achieve Goals Good  ADL Goals  Pt Will Perform Grooming with mod assist;Sitting, chair;Supported;with cueing (comment type and amount)  ADL Goal: Grooming - Progress Progressing toward goals  Pt Will Perform Upper Body Bathing with mod assist;Sitting, chair;Supported;with cueing (comment type and amount)  ADL Goal: Upper Body Bathing - Progress Progressing toward goals  Additional ADL Goal #1  Pt will complete bed mobility with mod A in preparation for  ADL.   ADL Goal: Additional Goal #1 - Progress Progressing toward goals  Additional ADL Goal #2 Pt will sit EOB with min A supported in preparation for ADL  ADL Goal: Additional Goal #2 - Progress Progressing toward goals  Arm Goals  Additional Arm Goal #1 Wife will complete P/AAROM BUE with S  Arm Goal: Additional Goal #1 - Progress Progressing toward goals  Miscellaneous OT Goals  Miscellaneous OT Goal #1 Wife will complete edema control techniques with S R hand  OT Goal: Miscellaneous Goal #1 - Progress Progressing toward goals  OT General Charges  $OT Visit 1 Procedure  OT Treatments  $Therapeutic Activity 23-37 mins  $Neuromuscular Re-education 23-37 mins  [facility], OTR/L  [phone] 03/22/2012

## 2012-03-22 NOTE — Progress Notes (Signed)
SLP Cancellation Note  Patient Details Name: Connor Small MRN: [id number] DOB: 19-Jul-1960   Cancelled treatment:       Reason Eval/Treat Not Completed: Patient at procedure or test/unavailable. Planned for repeat FEES today, unfortunately, pt NPO for abdominal MRI all am, not take to MRI until later in pm. SLP will return tomorrow to attempt swallow eval. If necessary, pt could have thin Kangaroo NG placed for nutrition/meds and still have FEES tomorrow with tube in place. Please avoid larger bore firm NG tubes.   [name], [place] CCC-SLP [phone]  [name] 03/22/2012, 3:19 PM

## 2012-03-22 NOTE — Progress Notes (Signed)
SLP Cancellation Note  Patient Details Name: Connor Small MRN: [id number] DOB: 1960/09/04   Cancelled treatment:       Reason Eval/Treat Not Completed: Medical issues which prohibited therapy. Diet order states pt will be NPO for abdominal MRI today. Planned for repeat FEES today, but may need to hold off until tomorrow if pt does not have MRI until later. Will check chart at noon to see if pt could participate in pm today.   [name], [place] CCC-SLP [phone]  [name] 03/22/2012, 7:48 AM

## 2012-03-22 NOTE — Progress Notes (Signed)
Utilization review completed.  

## 2012-03-23 DIAGNOSIS — Z9049 Acquired absence of other specified parts of digestive tract: Secondary | ICD-10-CM

## 2012-03-23 DIAGNOSIS — S43001A Unspecified subluxation of right shoulder joint, initial encounter: Secondary | ICD-10-CM

## 2012-03-23 DIAGNOSIS — I639 Cerebral infarction, unspecified: Secondary | ICD-10-CM

## 2012-03-23 LAB — GLUCOSE, CAPILLARY
Glucose-Capillary: 78 mg/dL (ref 70–99)
Glucose-Capillary: 132 mg/dL — ABNORMAL HIGH (ref 70–99)
Glucose-Capillary: 85 mg/dL (ref 70–99)

## 2012-03-23 LAB — CBC
Hemoglobin: 9.5 g/dL — ABNORMAL LOW (ref 13.0–17.0)
MCV: 102.3 fL — ABNORMAL HIGH (ref 78.0–100.0)
Platelets: 50 10*3/uL — ABNORMAL LOW (ref 150–400)
RBC: 3.02 MIL/uL — ABNORMAL LOW (ref 4.22–5.81)
WBC: 12.7 10*3/uL — ABNORMAL HIGH (ref 4.0–10.5)

## 2012-03-23 LAB — COMPREHENSIVE METABOLIC PANEL
ALT: 217 U/L — ABNORMAL HIGH (ref 0–53)
AST: 280 U/L — ABNORMAL HIGH (ref 0–37)
CO2: 20 mEq/L (ref 19–32)
Chloride: 113 mEq/L — ABNORMAL HIGH (ref 96–112)
Creatinine, Ser: 1.97 mg/dL — ABNORMAL HIGH (ref 0.50–1.35)
GFR calc non Af Amer: 37 mL/min — ABNORMAL LOW (ref >90)
Total Bilirubin: 18 mg/dL — ABNORMAL HIGH (ref 0.3–1.2)

## 2012-03-23 LAB — MITOCHONDRIAL ANTIBODIES: Mitochondrial M2 Ab, IgG: 0.23 (ref <0.91)

## 2012-03-23 LAB — PROTIME-INR: Prothrombin Time: 19.8 seconds — ABNORMAL HIGH (ref 11.6–15.2)

## 2012-03-23 MED ORDER — HYDROCORTISONE SOD SUCCINATE 100 MG IJ SOLR
25.0000 mg | Freq: Two times a day (BID) | INTRAMUSCULAR | Status: DC
  Administered 2012-03-23: 25 mg via INTRAVENOUS
  Administered 2012-03-24: 06:00:00 via INTRAVENOUS
  Administered 2012-03-24 – 2012-03-25 (×2): 25 mg via INTRAVENOUS
  Filled 2012-03-23 (×6): qty 0.5

## 2012-03-23 MED ORDER — SODIUM CHLORIDE 0.9 % IJ SOLN
INTRAMUSCULAR | Status: AC
  Filled 2012-03-23: qty 30

## 2012-03-23 MED ORDER — ENSURE PUDDING PO PUDG
1.0000 | Freq: Three times a day (TID) | ORAL | Status: DC
  Administered 2012-03-23 – 2012-03-26 (×2): 1 via ORAL

## 2012-03-23 NOTE — Progress Notes (Signed)
The patient's fascia is starting to pull apart a bit more inferiorly.  Using abdominal binder.  VAC being changed.  May need surgery to close with retentions if this should get worse.  Kathryne Eriksson. Dahlia Bailiff, MD, [place] [phone] [facility]

## 2012-03-23 NOTE — Progress Notes (Addendum)
NUTRITION FOLLOW UP  Intervention:    Calorie count per MD order.  Continue through the weekend, RD to follow-up Monday with calorie count results.  Ensure Pudding PO QID, each supplement provides 170 kcal and 4 grams of protein.   Nutrition Dx:   Inadequate oral intake related to swallowing difficulty as evidenced by poor intake of meals, ongoing.  Goal: Intake to meet >90% of estimated nutrition needs, unmet.  Monitor:   PO intake, swallowing function, weight trend, labs.  Assessment: S/P FEES with SLP this morning; diet advanced to Dysphagia 1 with pudding thick liquids.  Doubt intake will be sufficient to meet nutrition needs.  TF is off.  Calorie count has been ordered.  If patient is not meeting >50% of caloric needs by 3/3, plans are to consider enteral feeding tube at that time.  Wound VAC in place to abdominal incisions.  Protein needs are increased, to support wound healing.   Height: Ht Readings from Last 1 Encounters:  02/26/12 5' 9"  (1.753 m)    Weight Status:   Wt Readings from Last 1 Encounters:  03/22/12 186 lb 8.2 oz (84.6 kg)  03/21/12  179 lb 7.3 oz (81.4 kg)  03/16/12  192 lb 0.3 oz (87.1 kg)  Admit wt 200 lb   Body mass index is 27.53 kg/(m^2). Overweight.  Re-estimated needs:  Kcal: 2200 - 2400  Protein: 140 - 160 gm  Fluid: >/= 2.2 L  Skin: abdominal surgical incisions; now with wound VAC  Diet Order: Dysphagia 1 with pudding thick liquids    Intake/Output Summary (Last 24 hours) at 03/23/12 1313 Last data filed at 03/23/12 1122  Gross per 24 hour  Intake    680 ml  Output   3650 ml  Net  -2970 ml    Last BM: 2/27  Labs:   Recent Labs Lab 03/17/12 0420  03/19/12 2230 03/20/12 0447 03/21/12 0433 03/22/12 0428 03/23/12 0445  NA 144  < > 153* 152* 154* 147* 142  K 3.5  < > 4.4 4.2 4.8 4.4 4.3  CL 108  < > 125* 123* 128* 120* 113*  CO2 20  < > 18* 18* 16* 18* 20  BUN 135*  < > 126* 116* 115* 102* 96*  CREATININE 2.63*  <  > 2.10* 2.06* 2.03* 1.99* 1.97*  CALCIUM 7.8*  < > 7.6* 7.9* 7.9* 7.7* 7.7*  MG 3.1*  --  2.5 2.5  --   --   --   PHOS 6.7*  < > 3.9 3.6 4.5 3.7 4.1  GLUCOSE 165*  < > 242* 202* 141* 134* 73  < > = values in this interval not displayed.  CBG (last 3)   Recent Labs  03/23/12 0335 03/23/12 0717 03/23/12 1107  GLUCAP 78 82 132*    Scheduled Meds: . antiseptic oral rinse  15 mL Mouth Rinse q12n4p  . chlorhexidine  15 mL Mouth Rinse BID  . hydrocortisone sodium succinate  25 mg Intravenous Q12H  . nystatin-triamcinolone   Topical BID  . sodium chloride  10-40 mL Intracatheter Q12H  . sodium chloride        Continuous Infusions: . sodium chloride    . dextrose 20 mL/hr at 03/23/12 0600  .  sodium bicarbonate  infusion 1000 mL 100 mL/hr at 03/23/12 0600     Molli Barrows, RD, LDN, [place] Pager# [phone] After Hours Pager# [phone]

## 2012-03-23 NOTE — Progress Notes (Signed)
NEURO HOSPITALIST PROGRESS NOTE   SUBJECTIVE:                                                                                                                        Offers no new neurological complains. MRI-DWI showed scattered small acute to subacute infarcts in both cerebral hemispheres, brainstem, and right cerebellum. Brain MRA is unimpressive. He is alert and awake and wife tells me that he is not cognitively impaired at this time, able to comprehend conversations and recognize family members.    OBJECTIVE:                                                                                                                           Vital signs in last 24 hours: Temp:  [97.5 F (36.4 C)-98.3 F (36.8 C)] 97.5 F (36.4 C) (02/28 1122) Pulse Rate:  [84-97] 84 (02/28 0350) Resp:  [10-17] 12 (02/28 0350) BP: (90-102)/(54-61) 90/54 mmHg (02/28 0350) SpO2:  [97 %-100 %] 99 % (02/28 0350)  Intake/Output from previous day: 02/27 0701 - 02/28 0700 In: 2000 [I.V.:2000] Out: 3720 [Urine:2325; Drains:1295; Stool:100] Intake/Output this shift: Total I/O In: -  Out: 1040 [Urine:850; Drains:190] Nutritional status: Dysphagia  Past Medical History  Diagnosis Date  . Chronic diarrhea   . Rectal bleed   . Hemorrhoids   . Ulcerative colitis     Distal UC over 8 yrs ago diagnosed  . Diverticulitis of large intestine with perforation 10/2011    done at [place]    Neurologic ROS negative with exception of above.   Neurologic Exam:  Mental status: alert, awake, oriented to person and situation. Follows complex commands. No dysarthria or dysphasia. Cranial Nerves: II: Discs flat bilaterally; Visual fields grossly normal, pupils equal, round, reactive to light and accommodation III,IV, VI: ptosis not present, extra-ocular motions intact bilaterally V,VII: smile symmetric, facial light touch sensation normal bilaterally VIII: hearing normal  bilaterally IX,X: gag reflex present XI: bilateral shoulder shrug XII: midline tongue extension Motor: He is weak all over, right greater than left side. Muscle tone is diminished upper extremities. Sensory: Pinprick and light touch intact throughout, bilaterally Deep Tendon Reflexes: 1+ and symmetric throughout Plantars: Right: downgoing   Left: downgoing Cerebellar: No tested.  Gait: no tested. CV: pulses palpable throughout     Lab Results: Lab Results  Component Value Date/Time   CHOL 74 03/05/2012  4:30 AM   Lipid Panel No results found for this basename: CHOL, TRIG, HDL, CHOLHDL, VLDL, LDLCALC,  in the last 72 hours  Studies/Results: Mr [name] Wo Contrast  03/22/2012  *RADIOLOGY REPORT*  Clinical Data:  52 year old male with altered mental status. Decreased right side movement.  Comparison: Head CTs 03/11/2012 and earlier.  MRI HEAD WITHOUT CONTRAST  Technique: Multiplanar, multiecho pulse sequences of the brain and surrounding structures were obtained according to standard protocol without intravenous contrast.  Findings: There are at least 14 small foci of restricted diffusion in the brain.  Seven of these are in the left hemisphere, mostly the left MCA / PCA watershed areas.  The remaining foci are identified in the right occipital and temporal lobes, right brainstem, and right cerebellar hemisphere.  Associated T2 and FLAIR hyperintensity at these areas without mass effect. No acute intracranial hemorrhage identified.  Major intracranial vascular flow voids are preserved with dominant distal left vertebral artery.  MRA findings below.  No ventriculomegaly.  No intracranial mass effect or midline shift. Negative pituitary, cervicomedullary junction and visualized cervical spine.  Outside of the acute findings described above, there is only mild nonspecific cerebral white matter T2 and FLAIR hyperintensity.  Pulsation artifacts suspected in the cerebellum on the T1-weighted images.   Heterogeneous bone marrow signal without discrete destructive osseous lesion such as due to chronic disease or anemia.  Small mastoid effusions.  Small volume retained secretions in the nasopharynx.  Negative paranasal sinuses. Visualized orbit soft tissues are within normal limits.   Negative scalp soft tissues.  IMPRESSION: 1.  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  Favor sequelae of emboli from a cardiac or proximal aortic source.  Main alternate consideration is hypotensive episode. 2.  No mass effect or hemorrhage. 3.  MRA findings are below. 4.  Mastoid effusions and small volume retained secretions in the pharynx.  MRA HEAD WITHOUT CONTRAST  Technique: Angiographic images of the Circle of Willis were obtained using MRA technique without  intravenous contrast.  Findings: Study is moderately degraded by motion artifact despite repeated imaging attempts.  Antegrade flow in the dominant distal left vertebral artery.  There is antegrade flow detected in the nondominant distal right vertebral artery which probably functionally terminates in PICA. The left vertebral artery supplies the basilar which is patent.  No definite basilar stenosis.  Left AICA, bilateral SCA and bilateral PCA origins are patent.  Right larger than left posterior communicating arteries are evident.  Bilateral PCA branches are within normal limits.  Antegrade flow in both ICA siphons. No ICA stenosis identified. Ophthalmic artery origins and posterior communicating artery origins are within normal limits.  Carotid termini are patent.  MCA and ACA origins are within normal limits.  Diminutive anterior communicating artery.  Proximal ACA and MCA branches are patent. Distal branches are not well evaluated.  IMPRESSION:  No major circle of Willis branch occlusion or proximal intracranial stenosis identified.  Degraded by motion despite repeated imaging attempts.   Original Report  Authenticated By: Roselyn Reef, M.D.    Mr Brain Wo Contrast  03/22/2012  *RADIOLOGY REPORT*  Clinical Data:  52 year old male with altered mental status. Decreased right side movement.  Comparison: Head CTs 03/11/2012 and earlier.  MRI HEAD WITHOUT CONTRAST  Technique: Multiplanar, multiecho pulse sequences of the  brain and surrounding structures were obtained according to standard protocol without intravenous contrast.  Findings: There are at least 14 small foci of restricted diffusion in the brain.  Seven of these are in the left hemisphere, mostly the left MCA / PCA watershed areas.  The remaining foci are identified in the right occipital and temporal lobes, right brainstem, and right cerebellar hemisphere.  Associated T2 and FLAIR hyperintensity at these areas without mass effect. No acute intracranial hemorrhage identified.  Major intracranial vascular flow voids are preserved with dominant distal left vertebral artery.  MRA findings below.  No ventriculomegaly.  No intracranial mass effect or midline shift. Negative pituitary, cervicomedullary junction and visualized cervical spine.  Outside of the acute findings described above, there is only mild nonspecific cerebral white matter T2 and FLAIR hyperintensity.  Pulsation artifacts suspected in the cerebellum on the T1-weighted images.  Heterogeneous bone marrow signal without discrete destructive osseous lesion such as due to chronic disease or anemia.  Small mastoid effusions.  Small volume retained secretions in the nasopharynx.  Negative paranasal sinuses. Visualized orbit soft tissues are within normal limits.   Negative scalp soft tissues.  IMPRESSION: 1.  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  Favor sequelae of emboli from a cardiac or proximal aortic source.  Main alternate consideration is hypotensive episode. 2.  No mass effect or hemorrhage. 3.  MRA findings are below. 4.   Mastoid effusions and small volume retained secretions in the pharynx.  MRA HEAD WITHOUT CONTRAST  Technique: Angiographic images of the Circle of Willis were obtained using MRA technique without  intravenous contrast.  Findings: Study is moderately degraded by motion artifact despite repeated imaging attempts.  Antegrade flow in the dominant distal left vertebral artery.  There is antegrade flow detected in the nondominant distal right vertebral artery which probably functionally terminates in PICA. The left vertebral artery supplies the basilar which is patent.  No definite basilar stenosis.  Left AICA, bilateral SCA and bilateral PCA origins are patent.  Right larger than left posterior communicating arteries are evident.  Bilateral PCA branches are within normal limits.  Antegrade flow in both ICA siphons. No ICA stenosis identified. Ophthalmic artery origins and posterior communicating artery origins are within normal limits.  Carotid termini are patent.  MCA and ACA origins are within normal limits.  Diminutive anterior communicating artery.  Proximal ACA and MCA branches are patent. Distal branches are not well evaluated.  IMPRESSION:  No major circle of Willis branch occlusion or proximal intracranial stenosis identified.  Degraded by motion despite repeated imaging attempts.   Original Report Authenticated By: Roselyn Reef, M.D.    Mr Mrcp  03/23/2012  *RADIOLOGY REPORT*  Clinical Data:  Evaluate for bile duct obstruction.  MRI ABDOMEN WITHOUT CONTRAST (INCLUDING MRCP)  Technique:  Multiplanar multisequence MR imaging of the abdomen was performed both without the administration of intravenous contrast. Heavily T2-weighted images of the biliary and pancreatic ducts were obtained, and three-dimensional MRCP images were rendered by post processing.  Comparison:  03/11/2012  Findings:  Moderate sized bilateral pleural effusions identified.  There are no focal liver abnormalities identified.  Gallbladder wall  appears edematous measuring up to 6.5 mm in thickness.  No stone identified within the lumen of the gallbladder.  There is no significant intrahepatic biliary ductal dilatation.  The common bile duct has a normal caliber.  No pancreatic duct dilatation. No pancreatic pseudocyst identified.   There is a large  subcapsular splenic fluid collection identified. This is predominately T2 hyperintense.  On today's study this measures approximately 13.4 x 9.7 x 10 cm.  Debris and septations are identified within this fluid collection.  The adrenal glands appear normal.  The right kidney is normal. There is a cyst noted within the upper pole the left kidney measuring 1.5 cm.  Several smaller T2 hyperintense structures are noted within the inferior pole the left kidney.  Diffuse edema is not noted involving the gastric wall. There is a mild amount of free fluid within the soft tissues surrounding the stomach and within the perihepatic space.  No upper abdominal adenopathy noted. Fluid signal intensity is identified within the soft tissues dorsal to the spine. This likely represents the dependent soft tissue edema.  IMPRESSION:  1.  No intrahepatic bile duct dilatation or common bile duct dilatation. 2.  The gallbladder wall edema. No gallstones identified.  If there is a concern for cholecystitis then nuclear medicine hepatic biliary scan may be helpful to assess the patency of the cystic duct. 3.  Large, predominately T2 hyperintense subcapsular fluid collection encases the spleen.  This is of indeterminate etiology. This may represent a subacute to chronic subcapsular hematoma or abscess. 4.  Moderate bilateral pleural effusions.  5.  Diffuse edema of the gastric wall.   Original Report Authenticated By: Kerby Moors, M.D.     MEDICATIONS                                                                                                                       I have reviewed the patient's current  medications.  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 MRI cervical spine was recommended but not completed (very weak upper extremities). He is thrombocytopenic and agree that he is not a candidate for anticoagulation. Will ask stroke service for their input.   Dorian Pod, MD [facility] [phone]  03/23/2012, 11:53 AM

## 2012-03-23 NOTE — Procedures (Signed)
Objective Swallowing Evaluation: Fiberoptic Endoscopic Evaluation of Swallowing  Patient Details  Name: Connor Small MRN: [id number] Date of Birth: 1960-02-12  Today's Date: 03/23/2012 Time: 8295-6213 SLP Time Calculation (min): 40 min  Past Medical History:  Past Medical History  Diagnosis Date  . Chronic diarrhea   . Rectal bleed   . Hemorrhoids   . Ulcerative colitis     Distal UC over 8 yrs ago diagnosed  . Diverticulitis of large intestine with perforation 10/2011    done at [facility]   Past Surgical History:  Past Surgical History  Procedure Laterality Date  . Colonoscopy  9/03  . Sigmoidoscopy       06/13/2002  . Temporary ostomy November 06, 2010      for a colon perforation that was done in [facility] (Dr Ruben Im).    . Colonoscopy  02/16/2011    Procedure: COLONOSCOPY;  Surgeon: Malissa Hippo, MD;  Location: [facility];  Service: Endoscopy;  Laterality: N/A;  100  . Flexible sigmoidoscopy  02/27/2012    Procedure: FLEXIBLE SIGMOIDOSCOPY;  Surgeon: Malissa Hippo, MD;  Location: [facility];  Service: Endoscopy;  Laterality: N/A;  with colonic decompression  . Laparotomy  02/27/2012    Procedure: EXPLORATORY LAPAROTOMY;  Surgeon: Fabio Bering, MD;  Location: [facility];  Service: General;  Laterality: N/A;  . Cecostomy  02/27/2012    Procedure: CECOSTOMY;  Surgeon: Fabio Bering, MD;  Location: [facility];  Service: General;  Laterality: N/A;  Cecostomy Tube Placement  . Colectomy  02/28/2012    Procedure: TOTAL COLECTOMY;  Surgeon: Fabio Bering, MD;  Location: [facility];  Service: General;  Laterality: N/A;  . Ileostomy  02/28/2012    Procedure: ILEOSTOMY;  Surgeon: Fabio Bering, MD;  Location: [facility];  Service: General;  Laterality: N/A;  . Cystoscopy w/ ureteral stent placement Bilateral 03/20/2012    Procedure: CYSTOSCOPY WITH RETROGRADE PYELOGRAM/URETERAL STENT PLACEMENT;  Surgeon: Sebastian Ache, MD;  Location: [facility] OR;  Service: Urology;  Laterality: Bilateral;    HPI:  41 YOM admitted to [facility] for Cdiff on 2/2. Underwent decompressive cecostomy 2/3, initially was better, then worsened on 2/4 with increased abdominal distension and tenderness, decreased renal function.  He was taken back to the OR on 2/4 for total abdominal colectomy and end ileostomy.  Post op he developed ARDS and sepsis shock and was being treated for this.  The morning of 2/6 he had new onset of A-fib and transferred to [facility].  Initial FEES showed severe, dry pharyngeal secretions. after 2-3 days of frequent oral care and ice chips, repeat FEES to determine if pt may initiate PO.      Assessment / Plan / Recommendation Clinical Impression  Dysphagia Diagnosis: Mild oral phase dysphagia;Severe pharyngeal phase dysphagia Clinical impression: Pt demosntrates significant improvements in management of secretions and ability to follow commands for compensatory strategies. Function continue to be impaired by generalized weakness and poor endurance. Question if finding of new infarcts is also impacting function.   In contrast to the severe pharyngeal secretions seen trhee days ago, pts pharynx is clear and healthy with only mild errythema on laryngeal tissues. Pt with bilateral movement of vocal folds with adequate adduction. Phonation and cough response still very weak due to poor breath support.   With PO trials pt was observed to have a mild oral dysphagia with slow transit and mild oral residuals post swallow that fall to pharynx but are sensed by pt  who independently initates a second swallow. Timing of swallow initiation is delayed with evidence of penetration before the swallow with liquid textures. There is also penetration during and after the swallow due to general, but not focal, weakness of all pharyngeal/laryngeal structures with inconsistent sensation.  These deficits result in decreased airway protection during the swallow and residuals post swallow. With puree the pt improves  airway protection and transit of residuals with a chin tuck and double swallows.   Currently the pt too easily fatigues to attempt adequate PO intake for nutritional support though he may have purees and puddings throughout the day as desired by the pt. Pt should only attempt small amounts at a time preceeded and followed by oral care. He may continue to have ice chips independent of PO intake. Family and RN were given verbal and written instructions. Pt will also need alternate nutrition source. Question if pt will be a candidate for a means of more permanent alternate nurition if significant functional gains are not seen in short term. SLP will continue to follow closely for tolerance and potential for upgrade.     Treatment Recommendation  F/U FEES in ___ days (Comment);Therapy as outlined in treatment plan below    Diet Recommendation Pudding-thick liquid;Dysphagia 1 (Puree);Alternative means - temporary   Medication Administration: Via alternative means Supervision: Staff feed patient;Trained caregiver to feed patient Compensations: Multiple dry swallows after each bite/sip;Slow rate;Small sips/bites Postural Changes and/or Swallow Maneuvers: Seated upright 90 degrees;Chin tuck    Other  Recommendations Oral Care Recommendations: Oral care QID;Oral care before and after PO Other Recommendations: Have oral suction available   Follow Up Recommendations  Inpatient Rehab    Frequency and Duration min 3x week  2 weeks   Pertinent Vitals/Pain NA    SLP Swallow Goals Patient will utilize recommended strategies during swallow to increase swallowing safety with: Maximum assistance;Maximal cueing   General HPI: [name] admitted to [facility] for Cdiff on 2/2. Underwent decompressive cecostomy 2/3, initially was better, then worsened on 2/4 with increased abdominal distension and tenderness, decreased renal function.  He was taken back to the OR on 2/4 for total abdominal colectomy and end ileostomy.   Post op he developed ARDS and sepsis shock and was being treated for this.  The morning of 2/6 he had new onset of A-fib and transferred to [facility].  Initial FEES showed severe, dry pharyngeal secretions. after 2-3 days of frequent oral care and ice chips, repeat FEES to determine if pt may initiate PO.  Type of Study: Fiberoptic Endoscopic Evaluation of Swallowing Reason for Referral: Objectively evaluate swallowing function Previous Swallow Assessment: FEES 03/20/12 Diet Prior to this Study: NPO Temperature Spikes Noted: No Respiratory Status: Supplemental O2 delivered via (comment) History of Recent Intubation: Yes Length of Intubations (days): 16 days Date extubated: 03/16/12 Behavior/Cognition: Alert;Requires cueing Oral Cavity - Dentition: Adequate natural dentition Oral Motor / Sensory Function: Impaired - see Bedside swallow eval Self-Feeding Abilities: Total assist Patient Positioning: Upright in bed Baseline Vocal Quality: Hoarse;Breathy;Low vocal intensity Volitional Cough: Weak Volitional Swallow: Able to elicit Anatomy: Within functional limits Pharyngeal Secretions: Normal    Reason for Referral Objectively evaluate swallowing function   Oral Phase Oral Preparation/Oral Phase Oral Phase: Impaired Oral - Honey Oral - Honey Teaspoon: Other (Comment);Delayed oral transit;Reduced posterior propulsion (oral residual, seen to fall to pharynx post swallow) Oral - Thin Oral - Thin Teaspoon: Not tested Oral - Solids Oral - Puree: Delayed oral transit;Reduced posterior propulsion (oral residual, seen  to fall to pharynx post swallow)   Pharyngeal Phase Pharyngeal Phase Pharyngeal Phase: Impaired Pharyngeal - Honey Pharyngeal - Honey Teaspoon: Delayed swallow initiation;Premature spillage to valleculae;Reduced pharyngeal peristalsis;Reduced epiglottic inversion;Reduced anterior laryngeal mobility;Reduced laryngeal elevation;Reduced airway/laryngeal closure;Reduced tongue base  retraction;Penetration/Aspiration during swallow;Penetration/Aspiration after swallow;Trace aspiration;Pharyngeal residue - valleculae;Pharyngeal residue - pyriform sinuses;Lateral channel residue;Compensatory strategies attempted (Comment) (chin tuck, double swallow) Penetration/Aspiration details (honey teaspoon): Material does not enter airway;Material enters airway, CONTACTS cords and not ejected out;Material enters airway, passes BELOW cords then ejected out;Material enters airway, passes BELOW cords and not ejected out despite cough attempt by patient Pharyngeal - Nectar Pharyngeal - Nectar Teaspoon: Delayed swallow initiation;Premature spillage to valleculae;Reduced pharyngeal peristalsis;Reduced epiglottic inversion;Reduced anterior laryngeal mobility;Reduced laryngeal elevation;Reduced airway/laryngeal closure;Reduced tongue base retraction;Penetration/Aspiration during swallow;Penetration/Aspiration after swallow;Trace aspiration;Pharyngeal residue - valleculae;Pharyngeal residue - pyriform sinuses;Lateral channel residue;Compensatory strategies attempted (Comment) Penetration/Aspiration details (nectar teaspoon): Material enters airway, CONTACTS cords and not ejected out;Material enters airway, remains ABOVE vocal cords and not ejected out Pharyngeal - Thin Pharyngeal - Ice Chips: Premature spillage to pyriform sinuses;Reduced pharyngeal peristalsis;Reduced epiglottic inversion;Reduced anterior laryngeal mobility;Reduced laryngeal elevation;Reduced airway/laryngeal closure;Reduced tongue base retraction Pharyngeal - Solids Pharyngeal - Puree: Delayed swallow initiation;Premature spillage to valleculae;Reduced pharyngeal peristalsis;Reduced epiglottic inversion;Reduced anterior laryngeal mobility;Reduced laryngeal elevation;Reduced airway/laryngeal closure;Reduced tongue base retraction;Penetration/Aspiration during swallow;Pharyngeal residue - valleculae;Pharyngeal residue - pyriform  sinuses;Lateral channel residue Penetration/Aspiration details (puree): Material enters airway, remains ABOVE vocal cords then ejected out;Material does not enter airway  Cervical Esophageal Phase    GO   Harlon Ditty, MA CCC-SLP [phone]            Claudine Mouton 03/23/2012, 11:53 AM

## 2012-03-23 NOTE — Progress Notes (Signed)
Patient ID: Connor Small, male   DOB: 1960-07-15, 52 y.o.   MRN: [id number] 3 Days Post-Op  Subjective: Pt had a rough night, but feels ok.  Had issues with VAC last night.  Objective: Vital signs in last 24 hours: Temp:  [97.8 F (36.6 C)-98.3 F (36.8 C)] 98 F (36.7 C) (02/28 0723) Pulse Rate:  [84-97] 84 (02/28 0350) Resp:  [10-17] 12 (02/28 0350) BP: (90-102)/(54-61) 90/54 mmHg (02/28 0350) SpO2:  [97 %-100 %] 99 % (02/28 0350) Last BM Date: 03/22/12  Intake/Output from previous day: 02/27 0701 - 02/28 0700 In: 2000 [I.V.:2000] Out: 3720 [Urine:2325; Drains:1295; Stool:100] Intake/Output this shift: Total I/O In: -  Out: 520 [Urine:400; Drains:120]  PE: Abd: soft, ostomy with good output, stoma with necrotic slough, but underneath is pink mucosa.  Wound VAC removed with 80% wound dehiscence.  Omentum visible in upper wound.  When patient coughs, a small loop of bowel pops out just under this.  Full dehiscence of lower wound with granulation present.  Abdominal ascites present.  Very dark due to elevated bilirubin.  Lab Results:   Recent Labs  03/22/12 0428 03/23/12 0445  WBC 10.7* 12.7*  HGB 9.4* 9.5*  HCT 30.5* 30.9*  PLT 44* 50*   BMET  Recent Labs  03/22/12 0428 03/23/12 0445  NA 147* 142  K 4.4 4.3  CL 120* 113*  CO2 18* 20  GLUCOSE 134* 73  BUN 102* 96*  CREATININE 1.99* 1.97*  CALCIUM 7.7* 7.7*   PT/INR  Recent Labs  03/22/12 0428 03/23/12 0445  LABPROT 19.4* 19.8*  INR 1.70* 1.75*   CMP     Component Value Date/Time   NA 142 03/23/2012 0445   K 4.3 03/23/2012 0445   CL 113* 03/23/2012 0445   CO2 20 03/23/2012 0445   GLUCOSE 73 03/23/2012 0445   BUN 96* 03/23/2012 0445   CREATININE 1.97* 03/23/2012 0445   CALCIUM 7.7* 03/23/2012 0445   PROT 3.4* 03/23/2012 0445   ALBUMIN 1.6* 03/23/2012 0445   AST 280* 03/23/2012 0445   ALT 217* 03/23/2012 0445   ALKPHOS 693* 03/23/2012 0445   BILITOT 18.0* 03/23/2012 0445   GFRNONAA 37* 03/23/2012 0445    GFRAA 43* 03/23/2012 0445   Lipase     Component Value Date/Time   LIPASE 9* 02/26/2012 1409       Studies/Results: Dg Shoulder Right  03/21/2012  *RADIOLOGY REPORT*  Clinical Data: Evaluate dislocation  RIGHT SHOULDER - 2+ VIEW  Comparison: Chest radiograph - 03/19/2012  Findings:  Examination degraded secondary to obliquity and only providing a solitary view only providing a solitary slightly oblique AP view.  No definite fracture.  There is inferior subluxation of the humeral head in relation to the glenoid.  No definite anterior/inferior dislocation, though a posterior dislocation is not excluded on the basis of this examination.  Limited visualization of the adjacent right hemithorax suggests a layering small right-sided pleural effusion with associated right basilar opacities.  Large bore right jugular approach central venous catheter tip projects over the mid SVC.  The distal end of the known left jugular central venous catheter tip also projects over the mid SVC. A portion of the mid aspect of enteric tube is visualized.  IMPRESSION: Degraded examination demonstrates inferior subluxation of the humeral head in relation to the glenoid.  While there is no definitive anterior/inferior shoulder dislocation.   While a posterior shoulder dislocation is not excluded on the basis of this examination, a posterior dislocation is unlikely  in the absence of recent seizure activity or trauma. Clinical correlation is advised.   Original Report Authenticated By: Tacey Ruiz, MD    Connor Small  03/22/2012  *RADIOLOGY REPORT*  Clinical Data:  52 year old male with altered mental status. Decreased right side movement.  Comparison: Head CTs 03/11/2012 and earlier.  MRI HEAD WITHOUT Small  Technique: Multiplanar, multiecho pulse sequences of the brain and surrounding structures were obtained according to standard protocol without intravenous Small.  Findings: There are at least 14 small foci of  restricted diffusion in the brain.  Seven of these are in the left hemisphere, mostly the left MCA / PCA watershed areas.  The remaining foci are identified in the right occipital and temporal lobes, right brainstem, and right cerebellar hemisphere.  Associated T2 and FLAIR hyperintensity at these areas without mass effect. No acute intracranial hemorrhage identified.  Major intracranial vascular flow voids are preserved with dominant distal left vertebral artery.  MRA findings below.  No ventriculomegaly.  No intracranial mass effect or midline shift. Negative pituitary, cervicomedullary junction and visualized cervical spine.  Outside of the acute findings described above, there is only mild nonspecific cerebral white matter T2 and FLAIR hyperintensity.  Pulsation artifacts suspected in the cerebellum on the T1-weighted images.  Heterogeneous bone marrow signal without discrete destructive osseous lesion such as due to chronic disease or anemia.  Small mastoid effusions.  Small volume retained secretions in the nasopharynx.  Negative paranasal sinuses. Visualized orbit soft tissues are within normal limits.   Negative scalp soft tissues.  IMPRESSION: 1.  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  Favor sequelae of emboli from a cardiac or proximal aortic source.  Main alternate consideration is hypotensive episode. 2.  No mass effect or hemorrhage. 3.  MRA findings are below. 4.  Mastoid effusions and small volume retained secretions in the pharynx.  MRA HEAD WITHOUT Small  Technique: Angiographic images of the Circle of Willis were obtained using MRA technique without  intravenous Small.  Findings: Study is moderately degraded by motion artifact despite repeated imaging attempts.  Antegrade flow in the dominant distal left vertebral artery.  There is antegrade flow detected in the nondominant distal right vertebral artery which probably  functionally terminates in PICA. The left vertebral artery supplies the basilar which is patent.  No definite basilar stenosis.  Left AICA, bilateral SCA and bilateral PCA origins are patent.  Right larger than left posterior communicating arteries are evident.  Bilateral PCA branches are within normal limits.  Antegrade flow in both ICA siphons. No ICA stenosis identified. Ophthalmic artery origins and posterior communicating artery origins are within normal limits.  Carotid termini are patent.  MCA and ACA origins are within normal limits.  Diminutive anterior communicating artery.  Proximal ACA and MCA branches are patent. Distal branches are not well evaluated.  IMPRESSION:  No major circle of Willis branch occlusion or proximal intracranial stenosis identified.  Degraded by motion despite repeated imaging attempts.   Original Report Authenticated By: Erskine Speed, M.D.    Connor Brain Wo Small  03/22/2012  *RADIOLOGY REPORT*  Clinical Data:  52 year old male with altered mental status. Decreased right side movement.  Comparison: Head CTs 03/11/2012 and earlier.  MRI HEAD WITHOUT Small  Technique: Multiplanar, multiecho pulse sequences of the brain and surrounding structures were obtained according to standard protocol without intravenous Small.  Findings: There are at least 14 small foci of restricted diffusion  in the brain.  Seven of these are in the left hemisphere, mostly the left MCA / PCA watershed areas.  The remaining foci are identified in the right occipital and temporal lobes, right brainstem, and right cerebellar hemisphere.  Associated T2 and FLAIR hyperintensity at these areas without mass effect. No acute intracranial hemorrhage identified.  Major intracranial vascular flow voids are preserved with dominant distal left vertebral artery.  MRA findings below.  No ventriculomegaly.  No intracranial mass effect or midline shift. Negative pituitary, cervicomedullary junction and visualized  cervical spine.  Outside of the acute findings described above, there is only mild nonspecific cerebral white matter T2 and FLAIR hyperintensity.  Pulsation artifacts suspected in the cerebellum on the T1-weighted images.  Heterogeneous bone marrow signal without discrete destructive osseous lesion such as due to chronic disease or anemia.  Small mastoid effusions.  Small volume retained secretions in the nasopharynx.  Negative paranasal sinuses. Visualized orbit soft tissues are within normal limits.   Negative scalp soft tissues.  IMPRESSION: 1.  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  Favor sequelae of emboli from a cardiac or proximal aortic source.  Main alternate consideration is hypotensive episode. 2.  No mass effect or hemorrhage. 3.  MRA findings are below. 4.  Mastoid effusions and small volume retained secretions in the pharynx.  MRA HEAD WITHOUT Small  Technique: Angiographic images of the Circle of Willis were obtained using MRA technique without  intravenous Small.  Findings: Study is moderately degraded by motion artifact despite repeated imaging attempts.  Antegrade flow in the dominant distal left vertebral artery.  There is antegrade flow detected in the nondominant distal right vertebral artery which probably functionally terminates in PICA. The left vertebral artery supplies the basilar which is patent.  No definite basilar stenosis.  Left AICA, bilateral SCA and bilateral PCA origins are patent.  Right larger than left posterior communicating arteries are evident.  Bilateral PCA branches are within normal limits.  Antegrade flow in both ICA siphons. No ICA stenosis identified. Ophthalmic artery origins and posterior communicating artery origins are within normal limits.  Carotid termini are patent.  MCA and ACA origins are within normal limits.  Diminutive anterior communicating artery.  Proximal ACA and MCA branches are  patent. Distal branches are not well evaluated.  IMPRESSION:  No major circle of Willis branch occlusion or proximal intracranial stenosis identified.  Degraded by motion despite repeated imaging attempts.   Original Report Authenticated By: Erskine Speed, M.D.     Anti-infectives: Anti-infectives   Start     Dose/Rate Route Frequency Ordered Stop   03/20/12 1145  ciprofloxacin (CIPRO) IVPB 400 mg    Comments:  ON CALL TO OR   400 mg 200 mL/hr over 60 Minutes Intravenous  Once 03/20/12 1143 03/20/12 1722   03/18/12 1000  vancomycin (VANCOCIN) 1,500 mg in sodium chloride 0.9 % 500 mL IVPB  Status:  Discontinued     1,500 mg 250 mL/hr over 120 Minutes Intravenous Every 48 hours 03/17/12 1920 03/19/12 0840   03/14/12 1000  micafungin (MYCAMINE) 100 mg in sodium chloride 0.9 % 100 mL IVPB  Status:  Discontinued     100 mg 100 mL/hr over 1 Hours Intravenous Daily 03/13/12 0632 03/19/12 0840   03/13/12 1000  micafungin (MYCAMINE) 100 mg in sodium chloride 0.9 % 100 mL IVPB     100 mg 100 mL/hr over 1 Hours Intravenous Daily 03/13/12 0632 03/13/12 1311  03/12/12 1800  vancomycin (VANCOCIN) IVPB 1000 mg/200 mL premix  Status:  Discontinued     1,000 mg 200 mL/hr over 60 Minutes Intravenous Every 24 hours 03/12/12 0859 03/17/12 1017   03/12/12 1000  imipenem-cilastatin (PRIMAXIN) 500 mg in sodium chloride 0.9 % 100 mL IVPB  Status:  Discontinued     500 mg 200 mL/hr over 30 Minutes Intravenous 3 times per day 03/12/12 0848 03/12/12 0909   03/12/12 1000  imipenem-cilastatin (PRIMAXIN) 250 mg in sodium chloride 0.9 % 100 mL IVPB  Status:  Discontinued     250 mg 200 mL/hr over 30 Minutes Intravenous 4 times per day 03/12/12 0909 03/19/12 0841   03/11/12 1200  vancomycin (VANCOCIN) 750 mg in sodium chloride 0.9 % 150 mL IVPB  Status:  Discontinued     750 mg 150 mL/hr over 60 Minutes Intravenous Every 12 hours 03/11/12 0204 03/12/12 0858   03/11/12 0215  vancomycin (VANCOCIN) 750 mg in sodium  chloride 0.9 % 150 mL IVPB     750 mg 150 mL/hr over 60 Minutes Intravenous  Once 03/11/12 0204 03/11/12 0352   03/11/12 0215  aztreonam (AZACTAM) 1 g in dextrose 5 % 50 mL IVPB  Status:  Discontinued     1 g 100 mL/hr over 30 Minutes Intravenous 3 times per day 03/11/12 0204 03/12/12 0851   03/05/12 2200  metroNIDAZOLE (FLAGYL) IVPB 500 mg  Status:  Discontinued    Comments:  First dose ASAP   500 mg 100 mL/hr over 60 Minutes Intravenous Every 8 hours 03/05/12 1346 03/19/12 1509   03/02/12 1200  vancomycin (VANCOCIN) 50 mg/mL oral solution 500 mg  Status:  Discontinued     500 mg Oral 4 times per day 03/02/12 0913 03/19/12 1509   03/01/12 1445  vancomycin (VANCOCIN) 50 mg/mL oral solution 500 mg  Status:  Discontinued     500 mg Oral 3 times per day 03/01/12 1340 03/02/12 0913   03/01/12 1400  aztreonam (AZACTAM) 1 g in dextrose 5 % 50 mL IVPB  Status:  Discontinued     1 g 100 mL/hr over 30 Minutes Intravenous 3 times per day 03/01/12 1356 03/02/12 1146   03/01/12 1300  vancomycin (VANCOCIN) 1,250 mg in sodium chloride 0.9 % 250 mL IVPB  Status:  Discontinued     1,250 mg 166.7 mL/hr over 90 Minutes Intravenous Every 12 hours 03/01/12 1127 03/02/12 1146   02/29/12 0600  ciprofloxacin (CIPRO) IVPB 400 mg     400 mg 200 mL/hr over 60 Minutes Intravenous On call to O.R. 02/28/12 1409 02/28/12 1438   02/28/12 2200  vancomycin (VANCOCIN) 500 mg in sodium chloride irrigation 0.9 % 60 mL ENEMA  Status:  Discontinued     500 mg Rectal 2 times daily 02/28/12 1504 02/28/12 1817   02/28/12 1200  vancomycin (VANCOCIN) 50 mg/mL oral solution 500 mg  Status:  Discontinued    Comments:  First dose ASAP   500 mg Per Tube 4 times per day 02/28/12 0814 02/28/12 1817   02/28/12 1000  vancomycin (VANCOCIN) IVPB 1000 mg/200 mL premix  Status:  Discontinued     1,000 mg 200 mL/hr over 60 Minutes Intravenous Every 12 hours 02/28/12 0845 03/01/12 1127   02/28/12 1000  vancomycin (VANCOCIN) 500 mg in sodium  chloride irrigation 0.9 % 100 mL ENEMA  Status:  Discontinued     500 mg Rectal 2 times daily 02/28/12 0923 02/28/12 1504   02/27/12 1600  metroNIDAZOLE (FLAGYL) IVPB 500  mg  Status:  Discontinued     500 mg 100 mL/hr over 60 Minutes Intravenous Every 8 hours 02/27/12 1546 02/27/12 2000   02/27/12 1600  ciprofloxacin (CIPRO) IVPB 400 mg  Status:  Discontinued     400 mg 200 mL/hr over 60 Minutes Intravenous Every 12 hours 02/27/12 1547 02/27/12 2000   02/27/12 1545  metroNIDAZOLE (FLAGYL) IVPB 500 mg  Status:  Discontinued     500 mg 100 mL/hr over 60 Minutes Intravenous  Once 02/27/12 1535 02/28/12 0843   02/27/12 1545  vancomycin (VANCOCIN) 500 mg in sodium chloride 0.9 % 100 mL IVPB  Status:  Discontinued     500 mg 100 mL/hr over 60 Minutes Intravenous  Once 02/27/12 1535 02/27/12 1538   02/27/12 1545  ciprofloxacin (CIPRO) IVPB 400 mg  Status:  Discontinued     400 mg 200 mL/hr over 60 Minutes Intravenous  Once 02/27/12 1538 02/28/12 0843   02/27/12 1415  vancomycin (VANCOCIN) 500 mg in sodium chloride 0.9 % 100 mL IVPB     500 mg 100 mL/hr over 60 Minutes Intravenous  Once 02/27/12 1402 02/27/12 1411   02/27/12 1400  vancomycin (VANCOCIN) powder 500 mg  Status:  Discontinued     500 mg Other To Surgery 02/27/12 1359 02/27/12 1402   02/27/12 1200  metroNIDAZOLE (FLAGYL) IVPB 500 mg  Status:  Discontinued    Comments:  First dose ASAP   500 mg 100 mL/hr over 60 Minutes Intravenous Every 6 hours 02/27/12 0953 03/05/12 1346   02/27/12 1200  vancomycin (VANCOCIN) 50 mg/mL oral solution 250 mg  Status:  Discontinued    Comments:  First dose ASAP   250 mg Oral 4 times per day 02/27/12 0953 02/28/12 0814   02/26/12 1700  oseltamivir (TAMIFLU) capsule 75 mg     75 mg Oral  Once 02/26/12 1651 02/26/12 1715       Assessment/Plan  1. S/p total colectomy for c diff, ileostomy 2. Hyperbilirubinemia, cause unknown, MRCP pending 3. Multiple small infarcts of brain 4. VDRF, now  extubated 5. Thrombocytopenia 6. Wound dehiscence 7. PCM  Plan: 1. Await MRCP. 2. Continue with wound VAC, but will change and place mepitel to entire wound before black sponge gets placed.  3. Await swallow study.  Depending on their recommendations, hopefully tube feeds don't need to be reinitiated and he can eat orally. 4. Cont JP drains due to excessive output  LOS: 26 days    Henry Demeritt E 03/23/2012, 8:32 AM Pager: [phone]

## 2012-03-23 NOTE — Progress Notes (Signed)
Occupational Therapy Treatment Patient Details Name: Connor Small MRN: [id number] DOB: 1961-01-16 Today's Date: 03/23/2012 Time: 3151-7616 OT Time Calculation (min): 42 min  OT Assessment / Plan / Recommendation Comments on Treatment Session Pt able to understand and follow one step commands during session, but still needs extensive physical assist.  Total +2 for transfer to EOB and all bed mobility.  Pt is able to maintain static sitting at EOB with max assist but sits in a flexed posture with head flexed as well.  Able to tolerate EOB for 15 mins today with wife and daughter present .  Was able to achieve neutral cervical extension and maintain with mod assist for intervals of 10-30 seconds.    Follow Up Recommendations  CIR       Equipment Recommendations  3 in 1 bedside comode;Tub/shower bench       Frequency Min 3X/week   Plan Discharge plan remains appropriate    Precautions / Restrictions Precautions Precautions: Fall Precaution Comments: severe weakness and need for total assist +2 for all bed mobility. Restrictions Weight Bearing Restrictions: No   Pertinent Vitals/Pain BP 96/52 supine, O2 sats 96% on room air    ADL  Transfers/Ambulation Related to ADLs: not tested. ADL Comments: Educated pt's wife and daughter on AAROM exercises for bilateral shoulders, elbows, wrists, and digits.  Also provided handout for visual reference.  Pt transitioned to EOB with total assist.  Maintained static sitting EOB with max assist.  Focused on pt achieveing neutral cervical extension in sitting and being able to maintain.  Initially needed max assist to extend his neck and maintained but progressed to min assist when given target to look at.  Able to maintain in neutral positon for 10-30 seconds depending on fatigue level. Pt needed max assist for balance and would attempt to selfcorrect LOB to the left and posteriorly when cued to do so.  Able to tolerate sitting EOB and working on trunk and  head control for 15 mins before fatiguing and needing to return to supine to rest.     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      Cognition  Cognition Overall Cognitive Status: Difficult to assess Difficult to assess due to: Impaired communication Arousal/Alertness: Lethargic Behavior During Session: Lethargic Current Attention Level: Focused Attention - Other Comments: Pt very fatigued this pm but attempted to initiate and follow one step commands consistently, just needs physical assist to complete. Following Commands: Follows one step commands consistently    Mobility  Bed Mobility Bed Mobility: Rolling Left;Left Sidelying to Sit;Supine to Sit Rolling Left: 1: +2 Total assist Rolling Left: Patient Percentage: 10% Left Sidelying to Sit: 1: +2 Total assist Left Sidelying to Sit: Patient Percentage: 10% Sitting - Scoot to Edge of Bed: 1: +2 Total assist Sitting - Scoot to Edge of Bed: Patient Percentage: 10% Sit to Supine: 1: +2 Total assist Sit to Supine: Patient Percentage: 10%    Exercises  General Exercises - Upper Extremity Shoulder Flexion: AAROM;Both;10 reps;Supine Elbow Flexion: AAROM;Both;10 reps;Supine Elbow Extension: AAROM;Both;10 reps;Supine Wrist Flexion: PROM;Right;10 reps;Supine Wrist Extension: PROM;Right;10 reps;Supine Digit Composite Flexion: AAROM;Both;5 reps;Supine Composite Extension:  AAROM;Both;5 reps;Supine   Balance Static Sitting Balance Static Sitting - Balance Support: Right upper extremity supported;Left upper extremity supported Static Sitting - Level of  Assistance: 2: Max assist Static Sitting - Comment/# of Minutes: 15 mins static sitting   End of Session OT - End of Session Activity Tolerance: Patient limited by fatigue Patient left: in bed;with call bell/phone within reach;with family/visitor present     [facility] OTR/L Pager number [phone] 03/23/2012, 3:52 PM

## 2012-03-23 NOTE — Progress Notes (Signed)
PULMONARY  / CRITICAL CARE MEDICINE  Name: Connor Small MRN: [id number] DOB: 1960/08/03    ADMISSION DATE:  02/26/2012 CONSULTATION DATE:  2/5  REFERRING MD :  Karilyn Cota  CHIEF COMPLAINT:  Acute resp failure   BRIEF PATIENT DESCRIPTION:  68 YOM admitted to [facility] for Cdiff on 2/2. Underwent decompressive cecostomy 2/3, initially was better, then worsened on 2/4 with increased abdominal distension and tenderness, decreased renal function.  He was taken back to the OR on 2/4 for total abdominal colectomy and end ileostomy.  Post op he developed ARDS and sepsis shock and was being treated for this.  The morning of 2/6 he had new onset of A-fib and transferred to [facility].    SIGNIFICANT EVENTS / STUDIES:  2/2 Admit to [facility] with C diff, toxic megacolon 2/3 Decompressive Cecostomy 2/4 Total abdominal colectomy and end ileostomy 2/6 New A-fib and transfer to [facility] health. 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 2/10 CT abd/pelvis: small hematoma likely subcapsular spleen, JPs wnl 2/11 trial TFs  2/11 UE venous dopplers: Findings consistent with superficial vein thrombosis involving the right basilic vein 2/14 LE venous dopplers: No evidence of deep vein thrombosis 2/14 LUE venous doppler: No evidence of deep vein or superficial thrombosis involving the left upper extremity  2/15 neg 4 l bal on lasix drip, lasix d/c  2/16 poor neuro status, CT head: [facility] 2/17 CRRT initiated, high pressor needs 2/17 RUQ [place]: Gallbladder wall thickening in the absence of sludge or stones but no biliary ductal dilatation 2/18 reduced vasopressor reqts 2/19 New onset thrombocytopenia. Heparin D/C'd. Argatroban initiated 2/19 [place] abd (repeat): No explanation for elevated liver function tests 2/20 reduction pressors, improved alertness, extubated 2/20 LE venous dopplers: No  evidence of deep vein or superficial thrombosis 2/21 bleeding overnight from JP's, argatroban turned off, lower pressor needs 2/22 off pressors, on RA, converted to NSR 2/22 Carotid dopplers: No significant extracranial carotid artery stenosis demonstrated 2/25 Cystoscopy with bilateral retrograde pyelograms. No extravasation of contrast from either ureteral orifice. No evidence of hydroureteronephrosis. Unremarkable urinary bladder. B ureteral stents placed 2/27 MRI brain: Scattered small acute to subacute infarcts in both cerebral hemispheres, brainstem, and right cerebellum. Favor sequelae of emboli from a cardiac or proximal aortic source. Main alternate consideration is hypotensive episode 2/27 MRCP: No intrahepatic bile duct dilatation or common bile duct dilatation. No gallstones identified. Large subcapsular fluid collection encases the spleen. This is of indeterminate etiology. This may represent a subacute to chronic subcapsular hematoma or abscess. 2/28 SLP eval: FEES performed. Oropharyngeal weakness noted. Recommend replacement of NGT to supplement PO intake  LINES / TUBES: ETT ([facility]) 2/4>>> 2/20 PICC ([facility]) 2/4>>>2/11 Art Line ([facility]) 2/5>>>out L IJ CVL 2/06 >> out A line R fem 2/18>>>2/24 Rt ij HD 2/17 >> out Left IJ 2/18 >> 2/28 (ordered)  CULTURES: BCx2 2/4>>>negative BCx2 2/3>>>negative UC 2/3- Negative MRSA PCR 2/3- Negative C diff 2/6>>>Positive Body fluid 2/9>>>WBCs, no organisms 2/15 BC x 2 >> NEG 2/15 Sputum >>>NF  ANTIBIOTICS: Flagyl 2/3>> d/c'd Vanc 2/6 (IV) >>>2/7 azactam 2/6>>>2/7 Oral vanc 2/6>>> IV vanc 2/16 >2/24 Aztreonam 2/16 >2/17 Imipenem 2/17>>>2/24 mycofungin 2/18>>>2/24  SUBJECTIVE:  Cognition intact. No distress. Very weak.   VITAL SIGNS: Temp:  [97.8 F (36.6 C)-98.3 F (36.8 C)] 98 F (36.7 C) (02/28 0723) Pulse Rate:  [84-97] 84 (02/28 0350) Resp:  [10-17] 12 (  02/28 0350) BP: (90-102)/(54-61) 90/54 mmHg (02/28 0350) SpO2:  [97  %-100 %] 99 % (02/28 0350) HEMODYNAMICS:   VENTILATOR SETTINGS:   INTAKE / OUTPUT: Intake/Output     02/27 0701 - 02/28 0700 02/28 0701 - 03/01 0700   I.V. (mL/kg) 2000 (23.6)    NG/GT     Total Intake(mL/kg) 2000 (23.6)    Urine (mL/kg/hr) 2325 (1.1) 400 (1.2)   Drains 1295 (0.6) 120 (0.3)   Stool 100 (0)    Total Output 3720 520   Net -1720 -520          PHYSICAL EXAMINATION: General Icteric, NAD Neuro: diffusely weak, no focal deficits HEENT: Sclericterus, EOMI, PERRL Cardiovascular: RRR s M Lungs: clear anteriorly Abdomen: soft, NT, + BS, wound vac in place, abdominal binder Musculoskeletal: 2+ LE edema  Skin: rash improved  LABS: BMET    Component Value Date/Time   NA 142 03/23/2012 0445   K 4.3 03/23/2012 0445   CL 113* 03/23/2012 0445   CO2 20 03/23/2012 0445   GLUCOSE 73 03/23/2012 0445   BUN 96* 03/23/2012 0445   CREATININE 1.97* 03/23/2012 0445   CALCIUM 7.7* 03/23/2012 0445   GFRNONAA 37* 03/23/2012 0445   GFRAA 43* 03/23/2012 0445    Hepatic Function Panel     Component Value Date/Time   PROT 3.4* 03/23/2012 0445   ALBUMIN 1.6* 03/23/2012 0445   AST 280* 03/23/2012 0445   ALT 217* 03/23/2012 0445   ALKPHOS 693* 03/23/2012 0445   BILITOT 18.0* 03/23/2012 0445   BILIDIR 11.7* 03/13/2012 0700   IBILI 2.2* 03/13/2012 0700    CBC    Component Value Date/Time   WBC 12.7* 03/23/2012 0445   RBC 3.02* 03/23/2012 0445   HGB 9.5* 03/23/2012 0445   HCT 30.9* 03/23/2012 0445   PLT 50* 03/23/2012 0445   MCV 102.3* 03/23/2012 0445   MCH 31.5 03/23/2012 0445   MCHC 30.7 03/23/2012 0445   RDW 21.2* 03/23/2012 0445   LYMPHSABS 0.4* 03/22/2012 0428   MONOABS 0.3 03/22/2012 0428   EOSABS 0.0 03/22/2012 0428   BASOSABS 0.0 03/22/2012 0428      Recent Labs Lab 03/22/12 1732 03/22/12 1942 03/22/12 2329 03/23/12 0335 03/23/12 0717  GLUCAP 83 91 84 78 82    CXR:  No new film  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 No evidence of active blood loss. No indication for PRBCs   Small splenic hematoma   Relative adrenal insufficiency   Dysphagia due to oropharyngeal weakness  Resolved Problems:    history of UC (ulcerative colitis)   Atrial fibrillation, resolved   Acute respiratory failure, resolved   Septic shock, resolved   RUE DVT related to PICC, resolved   Hypokalemia, resolved   Hyperglycemia, resolved   Acute encephalopathy, resolved    PLAN: Needs to stay in SDU due to high level of nursing care required Neuro to re-eval  and address new MRI findings Cont PT/OT Will discuss R shoulder subluxation with Ortho Monitor hepatic function panel Begin PO diet- will hold off on feeding tube for now.  If not meeting > 50% of caloric needs by Monday 3/3, would consider small bore tube @ that time Monitor CBC, coags  Avoid all anticoagulants if possible due to high risk of bleed Recheck CXR 3/01 to assess pleural effusions Correct free water/HCO3 deficit - renal managing Taper hydrocortisone to off  Wife updated in detail @ bedside   Billy Fischer, MD ; [facility] service Mobile [phone].  After 5:30 PM or weekends, call [phone]

## 2012-03-23 NOTE — Progress Notes (Signed)
[facility] Progress Note  Subjective: Patient extubated 2 days ago, more alert undergoing swallowing test. Spoke to patient's wife and daughter outside the door. He seems to be mentating fairly well not complaining of specific abdominal pain  Objective: Vital signs in last 24 hours: Temp:  [97.8 F (36.6 C)-98.3 F (36.8 C)] 98 F (36.7 C) (02/28 0723) Pulse Rate:  [84-97] 84 (02/28 0350) Resp:  [10-17] 12 (02/28 0350) BP: (90-102)/(54-61) 90/54 mmHg (02/28 0350) SpO2:  [97 %-100 %] 99 % (02/28 0350) Weight change:    PE: Exam not done  Lab Results: Results for orders placed during the hospital encounter of 02/26/12 (from the past 24 hour(s))  GLUCOSE, CAPILLARY     Status: Abnormal   Collection Time    03/22/12 12:13 PM      Result Value Range   Glucose-Capillary 103 (*) 70 - 99 mg/dL  GLUCOSE, CAPILLARY     Status: None   Collection Time    03/22/12  5:32 PM      Result Value Range   Glucose-Capillary 83  70 - 99 mg/dL  GLUCOSE, CAPILLARY     Status: None   Collection Time    03/22/12  7:42 PM      Result Value Range   Glucose-Capillary 91  70 - 99 mg/dL   Comment 1 Notify RN     Comment 2 Documented in Chart    GLUCOSE, CAPILLARY     Status: None   Collection Time    03/22/12 11:29 PM      Result Value Range   Glucose-Capillary 84  70 - 99 mg/dL  GLUCOSE, CAPILLARY     Status: None   Collection Time    03/23/12  3:35 AM      Result Value Range   Glucose-Capillary 78  70 - 99 mg/dL   Comment 1 Notify RN     Comment 2 Documented in Chart    CBC     Status: Abnormal   Collection Time    03/23/12  4:45 AM      Result Value Range   WBC 12.7 (*) 4.0 - 10.5 K/uL   RBC 3.02 (*) 4.22 - 5.81 MIL/uL   Hemoglobin 9.5 (*) 13.0 - 17.0 g/dL   HCT 30.9 (*) 39.0 - 52.0 %   MCV 102.3 (*) 78.0 - 100.0 fL   MCH 31.5  26.0 - 34.0 pg   MCHC 30.7  30.0 - 36.0 g/dL   RDW 21.2 (*) 11.5 - 15.5 %   Platelets 50 (*) 150 - 400 K/uL  COMPREHENSIVE METABOLIC PANEL      Status: Abnormal   Collection Time    03/23/12  4:45 AM      Result Value Range   Sodium 142  135 - 145 mEq/L   Potassium 4.3  3.5 - 5.1 mEq/L   Chloride 113 (*) 96 - 112 mEq/L   CO2 20  19 - 32 mEq/L   Glucose, Bld 73  70 - 99 mg/dL   BUN 96 (*) 6 - 23 mg/dL   Creatinine, Ser 1.97 (*) 0.50 - 1.35 mg/dL   Calcium 7.7 (*) 8.4 - 10.5 mg/dL   Total Protein 3.4 (*) 6.0 - 8.3 g/dL   Albumin 1.6 (*) 3.5 - 5.2 g/dL   AST 280 (*) 0 - 37 U/L   ALT 217 (*) 0 - 53 U/L   Alkaline Phosphatase 693 (*) 39 - 117 U/L   Total Bilirubin 18.0 (*) 0.3 -  1.2 mg/dL   GFR calc non Af Amer 37 (*) >90 mL/min   GFR calc Af Amer 43 (*) >90 mL/min  PHOSPHORUS     Status: None   Collection Time    03/23/12  4:45 AM      Result Value Range   Phosphorus 4.1  2.3 - 4.6 mg/dL  PROTIME-INR     Status: Abnormal   Collection Time    03/23/12  4:45 AM      Result Value Range   Prothrombin Time 19.8 (*) 11.6 - 15.2 seconds   INR 1.75 (*) 0.00 - 1.49  GLUCOSE, CAPILLARY     Status: None   Collection Time    03/23/12  7:17 AM      Result Value Range   Glucose-Capillary 82  70 - 99 mg/dL   Comment 1 Notify RN     Comment 2 Documented in Chart      Studies/Results: Mr [name] Wo Contrast  03/22/2012  *RADIOLOGY REPORT*  Clinical Data:  52 year old male with altered mental status. Decreased right side movement.  Comparison: Head CTs 03/11/2012 and earlier.  MRI HEAD WITHOUT CONTRAST  Technique: Multiplanar, multiecho pulse sequences of the brain and surrounding structures were obtained according to standard protocol without intravenous contrast.  Findings: There are at least 14 small foci of restricted diffusion in the brain.  Seven of these are in the left hemisphere, mostly the left MCA / PCA watershed areas.  The remaining foci are identified in the right occipital and temporal lobes, right brainstem, and right cerebellar hemisphere.  Associated T2 and FLAIR hyperintensity at these areas without mass effect. No  acute intracranial hemorrhage identified.  Major intracranial vascular flow voids are preserved with dominant distal left vertebral artery.  MRA findings below.  No ventriculomegaly.  No intracranial mass effect or midline shift. Negative pituitary, cervicomedullary junction and visualized cervical spine.  Outside of the acute findings described above, there is only mild nonspecific cerebral white matter T2 and FLAIR hyperintensity.  Pulsation artifacts suspected in the cerebellum on the T1-weighted images.  Heterogeneous bone marrow signal without discrete destructive osseous lesion such as due to chronic disease or anemia.  Small mastoid effusions.  Small volume retained secretions in the nasopharynx.  Negative paranasal sinuses. Visualized orbit soft tissues are within normal limits.   Negative scalp soft tissues.  IMPRESSION: 1.  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  Favor sequelae of emboli from a cardiac or proximal aortic source.  Main alternate consideration is hypotensive episode. 2.  No mass effect or hemorrhage. 3.  MRA findings are below. 4.  Mastoid effusions and small volume retained secretions in the pharynx.  MRA HEAD WITHOUT CONTRAST  Technique: Angiographic images of the Circle of Willis were obtained using MRA technique without  intravenous contrast.  Findings: Study is moderately degraded by motion artifact despite repeated imaging attempts.  Antegrade flow in the dominant distal left vertebral artery.  There is antegrade flow detected in the nondominant distal right vertebral artery which probably functionally terminates in PICA. The left vertebral artery supplies the basilar which is patent.  No definite basilar stenosis.  Left AICA, bilateral SCA and bilateral PCA origins are patent.  Right larger than left posterior communicating arteries are evident.  Bilateral PCA branches are within normal limits.  Antegrade flow in both  ICA siphons. No ICA stenosis identified. Ophthalmic artery origins and posterior communicating artery origins are within normal limits.  Carotid termini are patent.  MCA and ACA origins are within normal limits.  Diminutive anterior communicating artery.  Proximal ACA and MCA branches are patent. Distal branches are not well evaluated.  IMPRESSION:  No major circle of Willis branch occlusion or proximal intracranial stenosis identified.  Degraded by motion despite repeated imaging attempts.   Original Report Authenticated By: Roselyn Reef, M.D.    Mr Brain Wo Contrast  03/22/2012  *RADIOLOGY REPORT*  Clinical Data:  52 year old male with altered mental status. Decreased right side movement.  Comparison: Head CTs 03/11/2012 and earlier.  MRI HEAD WITHOUT CONTRAST  Technique: Multiplanar, multiecho pulse sequences of the brain and surrounding structures were obtained according to standard protocol without intravenous contrast.  Findings: There are at least 14 small foci of restricted diffusion in the brain.  Seven of these are in the left hemisphere, mostly the left MCA / PCA watershed areas.  The remaining foci are identified in the right occipital and temporal lobes, right brainstem, and right cerebellar hemisphere.  Associated T2 and FLAIR hyperintensity at these areas without mass effect. No acute intracranial hemorrhage identified.  Major intracranial vascular flow voids are preserved with dominant distal left vertebral artery.  MRA findings below.  No ventriculomegaly.  No intracranial mass effect or midline shift. Negative pituitary, cervicomedullary junction and visualized cervical spine.  Outside of the acute findings described above, there is only mild nonspecific cerebral white matter T2 and FLAIR hyperintensity.  Pulsation artifacts suspected in the cerebellum on the T1-weighted images.  Heterogeneous bone marrow signal without discrete destructive osseous lesion such as due to chronic disease or anemia.   Small mastoid effusions.  Small volume retained secretions in the nasopharynx.  Negative paranasal sinuses. Visualized orbit soft tissues are within normal limits.   Negative scalp soft tissues.  IMPRESSION: 1.  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  Favor sequelae of emboli from a cardiac or proximal aortic source.  Main alternate consideration is hypotensive episode. 2.  No mass effect or hemorrhage. 3.  MRA findings are below. 4.  Mastoid effusions and small volume retained secretions in the pharynx.  MRA HEAD WITHOUT CONTRAST  Technique: Angiographic images of the Circle of Willis were obtained using MRA technique without  intravenous contrast.  Findings: Study is moderately degraded by motion artifact despite repeated imaging attempts.  Antegrade flow in the dominant distal left vertebral artery.  There is antegrade flow detected in the nondominant distal right vertebral artery which probably functionally terminates in PICA. The left vertebral artery supplies the basilar which is patent.  No definite basilar stenosis.  Left AICA, bilateral SCA and bilateral PCA origins are patent.  Right larger than left posterior communicating arteries are evident.  Bilateral PCA branches are within normal limits.  Antegrade flow in both ICA siphons. No ICA stenosis identified. Ophthalmic artery origins and posterior communicating artery origins are within normal limits.  Carotid termini are patent.  MCA and ACA origins are within normal limits.  Diminutive anterior communicating artery.  Proximal ACA and MCA branches are patent. Distal branches are not well evaluated.  IMPRESSION:  No major circle of Willis branch occlusion or proximal intracranial stenosis identified.  Degraded by motion despite repeated imaging attempts.   Original Report Authenticated By: Roselyn Reef, M.D.    Mr Mrcp  03/23/2012  *RADIOLOGY REPORT*  Clinical Data:  Evaluate for bile  duct obstruction.  MRI ABDOMEN WITHOUT CONTRAST (INCLUDING MRCP)  Technique:  Multiplanar  multisequence MR imaging of the abdomen was performed both without the administration of intravenous contrast. Heavily T2-weighted images of the biliary and pancreatic ducts were obtained, and three-dimensional MRCP images were rendered by post processing.  Comparison:  03/11/2012  Findings:  Moderate sized bilateral pleural effusions identified.  There are no focal liver abnormalities identified.  Gallbladder wall appears edematous measuring up to 6.5 mm in thickness.  No stone identified within the lumen of the gallbladder.  There is no significant intrahepatic biliary ductal dilatation.  The common bile duct has a normal caliber.  No pancreatic duct dilatation. No pancreatic pseudocyst identified.   There is a large subcapsular splenic fluid collection identified. This is predominately T2 hyperintense.  On today's study this measures approximately 13.4 x 9.7 x 10 cm.  Debris and septations are identified within this fluid collection.  The adrenal glands appear normal.  The right kidney is normal. There is a cyst noted within the upper pole the left kidney measuring 1.5 cm.  Several smaller T2 hyperintense structures are noted within the inferior pole the left kidney.  Diffuse edema is not noted involving the gastric wall. There is a mild amount of free fluid within the soft tissues surrounding the stomach and within the perihepatic space.  No upper abdominal adenopathy noted. Fluid signal intensity is identified within the soft tissues dorsal to the spine. This likely represents the dependent soft tissue edema.  IMPRESSION:  1.  No intrahepatic bile duct dilatation or common bile duct dilatation. 2.  The gallbladder wall edema. No gallstones identified.  If there is a concern for cholecystitis then nuclear medicine hepatic biliary scan may be helpful to assess the patency of the cystic duct. 3.  Large, predominately T2  hyperintense subcapsular fluid collection encases the spleen.  This is of indeterminate etiology. This may represent a subacute to chronic subcapsular hematoma or abscess. 4.  Moderate bilateral pleural effusions.  5.  Diffuse edema of the gastric wall.   Original Report Authenticated By: Kerby Moors, M.D.       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 No single underlying cause identified by MRI and no evidence of sclerosing cholangitis. Main findings of large subsequent capsular hematoma an MRI as well as thickened gallbladder without stones. Serologic workup ordered so far has all been negative. Antimitochondrial antibody is pending Plan: Overall did not think this likely represents a calculus cholecystitis but will make surgery aware. Gen. supportive care and treatment of other underlying problems. Is continue any unessential meds appear Watch for encephalopathy or any other signs of impending liver failure. We'll continue to follow with you.    Sten Dematteo C 03/23/2012, 9:56 AM

## 2012-03-23 NOTE — Progress Notes (Signed)
Subjective: Interval History: none.  Objective: Vital signs in last 24 hours: Temp:  [97.8 F (36.6 C)-98.3 F (36.8 C)] 98 F (36.7 C) (02/28 0723) Pulse Rate:  [84-97] 84 (02/28 0350) Resp:  [10-17] 12 (02/28 0350) BP: (90-102)/(54-61) 90/54 mmHg (02/28 0350) SpO2:  [97 %-100 %] 99 % (02/28 0350) Weight change:   Intake/Output from previous day: 02/27 0701 - 02/28 0700 In: 2000 [I.V.:2000] Out: 3720 [Urine:2325; Drains:1295; Stool:100] Intake/Output this shift: Total I/O In: -  Out: 520 [Urine:400; Drains:120]  General appearance: icteric, slowed mentation and sedated Resp: diminished breath sounds bibasilar and rales bibasilar Cardio: S1, S2 normal and systolic murmur: holosystolic 2/6, blowing at apex GI: pos bs, wound vac midline, drain lower mid abdm, ostomy on R Extremities: edema 3+  Lab Results:  Recent Labs  03/22/12 0428 03/23/12 0445  WBC 10.7* 12.7*  HGB 9.4* 9.5*  HCT 30.5* 30.9*  PLT 44* 50*   BMET:  Recent Labs  03/22/12 0428 03/23/12 0445  NA 147* 142  K 4.4 4.3  CL 120* 113*  CO2 18* 20  GLUCOSE 134* 73  BUN 102* 96*  CREATININE 1.99* 1.97*  CALCIUM 7.7* 7.7*   No results found for this basename: PTH,  in the last 72 hours Iron Studies: No results found for this basename: IRON, TIBC, TRANSFERRIN, FERRITIN,  in the last 72 hours  Studies/Results: Mr Connor Small Wo Contrast  03/22/2012  *RADIOLOGY REPORT*  Clinical Data:  52 year old male with altered mental status. Decreased right side movement.  Comparison: Head CTs 03/11/2012 and earlier.  MRI HEAD WITHOUT CONTRAST  Technique: Multiplanar, multiecho pulse sequences of the brain and surrounding structures were obtained according to standard protocol without intravenous contrast.  Findings: There are at least 14 small foci of restricted diffusion in the brain.  Seven of these are in the left hemisphere, mostly the left MCA / PCA watershed areas.  The remaining foci are identified in the right  occipital and temporal lobes, right brainstem, and right cerebellar hemisphere.  Associated T2 and FLAIR hyperintensity at these areas without mass effect. No acute intracranial hemorrhage identified.  Major intracranial vascular flow voids are preserved with dominant distal left vertebral artery.  MRA findings below.  No ventriculomegaly.  No intracranial mass effect or midline shift. Negative pituitary, cervicomedullary junction and visualized cervical spine.  Outside of the acute findings described above, there is only mild nonspecific cerebral white matter T2 and FLAIR hyperintensity.  Pulsation artifacts suspected in the cerebellum on the T1-weighted images.  Heterogeneous bone marrow signal without discrete destructive osseous lesion such as due to chronic disease or anemia.  Small mastoid effusions.  Small volume retained secretions in the nasopharynx.  Negative paranasal sinuses. Visualized orbit soft tissues are within normal limits.   Negative scalp soft tissues.  IMPRESSION: 1.  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  Favor sequelae of emboli from a cardiac or proximal aortic source.  Main alternate consideration is hypotensive episode. 2.  No mass effect or hemorrhage. 3.  MRA findings are below. 4.  Mastoid effusions and small volume retained secretions in the pharynx.  MRA HEAD WITHOUT CONTRAST  Technique: Angiographic images of the Circle of Willis were obtained using MRA technique without  intravenous contrast.  Findings: Study is moderately degraded by motion artifact despite repeated imaging attempts.  Antegrade flow in the dominant distal left vertebral artery.  There is antegrade flow detected in the nondominant distal right vertebral artery  which probably functionally terminates in PICA. The left vertebral artery supplies the basilar which is patent.  No definite basilar stenosis.  Left AICA, bilateral SCA and bilateral PCA  origins are patent.  Right larger than left posterior communicating arteries are evident.  Bilateral PCA branches are within normal limits.  Antegrade flow in both ICA siphons. No ICA stenosis identified. Ophthalmic artery origins and posterior communicating artery origins are within normal limits.  Carotid termini are patent.  MCA and ACA origins are within normal limits.  Diminutive anterior communicating artery.  Proximal ACA and MCA branches are patent. Distal branches are not well evaluated.  IMPRESSION:  No major circle of Willis branch occlusion or proximal intracranial stenosis identified.  Degraded by motion despite repeated imaging attempts.   Original Report Authenticated By: Roselyn Reef, M.D.    Mr Brain Wo Contrast  03/22/2012  *RADIOLOGY REPORT*  Clinical Data:  52 year old male with altered mental status. Decreased right side movement.  Comparison: Head CTs 03/11/2012 and earlier.  MRI HEAD WITHOUT CONTRAST  Technique: Multiplanar, multiecho pulse sequences of the brain and surrounding structures were obtained according to standard protocol without intravenous contrast.  Findings: There are at least 14 small foci of restricted diffusion in the brain.  Seven of these are in the left hemisphere, mostly the left MCA / PCA watershed areas.  The remaining foci are identified in the right occipital and temporal lobes, right brainstem, and right cerebellar hemisphere.  Associated T2 and FLAIR hyperintensity at these areas without mass effect. No acute intracranial hemorrhage identified.  Major intracranial vascular flow voids are preserved with dominant distal left vertebral artery.  MRA findings below.  No ventriculomegaly.  No intracranial mass effect or midline shift. Negative pituitary, cervicomedullary junction and visualized cervical spine.  Outside of the acute findings described above, there is only mild nonspecific cerebral white matter T2 and FLAIR hyperintensity.  Pulsation artifacts suspected  in the cerebellum on the T1-weighted images.  Heterogeneous bone marrow signal without discrete destructive osseous lesion such as due to chronic disease or anemia.  Small mastoid effusions.  Small volume retained secretions in the nasopharynx.  Negative paranasal sinuses. Visualized orbit soft tissues are within normal limits.   Negative scalp soft tissues.  IMPRESSION: 1.  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  Favor sequelae of emboli from a cardiac or proximal aortic source.  Main alternate consideration is hypotensive episode. 2.  No mass effect or hemorrhage. 3.  MRA findings are below. 4.  Mastoid effusions and small volume retained secretions in the pharynx.  MRA HEAD WITHOUT CONTRAST  Technique: Angiographic images of the Circle of Willis were obtained using MRA technique without  intravenous contrast.  Findings: Study is moderately degraded by motion artifact despite repeated imaging attempts.  Antegrade flow in the dominant distal left vertebral artery.  There is antegrade flow detected in the nondominant distal right vertebral artery which probably functionally terminates in PICA. The left vertebral artery supplies the basilar which is patent.  No definite basilar stenosis.  Left AICA, bilateral SCA and bilateral PCA origins are patent.  Right larger than left posterior communicating arteries are evident.  Bilateral PCA branches are within normal limits.  Antegrade flow in both ICA siphons. No ICA stenosis identified. Ophthalmic artery origins and posterior communicating artery origins are within normal limits.  Carotid termini are patent.  MCA and ACA origins are within normal limits.  Diminutive anterior communicating artery.  Proximal ACA  and MCA branches are patent. Distal branches are not well evaluated.  IMPRESSION:  No major circle of Willis branch occlusion or proximal intracranial stenosis identified.  Degraded by motion despite  repeated imaging attempts.   Original Report Authenticated By: Roselyn Reef, M.D.    Mr Mrcp  03/23/2012  *RADIOLOGY REPORT*  Clinical Data:  Evaluate for bile duct obstruction.  MRI ABDOMEN WITHOUT CONTRAST (INCLUDING MRCP)  Technique:  Multiplanar multisequence MR imaging of the abdomen was performed both without the administration of intravenous contrast. Heavily T2-weighted images of the biliary and pancreatic ducts were obtained, and three-dimensional MRCP images were rendered by post processing.  Comparison:  03/11/2012  Findings:  Moderate sized bilateral pleural effusions identified.  There are no focal liver abnormalities identified.  Gallbladder wall appears edematous measuring up to 6.5 mm in thickness.  No stone identified within the lumen of the gallbladder.  There is no significant intrahepatic biliary ductal dilatation.  The common bile duct has a normal caliber.  No pancreatic duct dilatation. No pancreatic pseudocyst identified.   There is a large subcapsular splenic fluid collection identified. This is predominately T2 hyperintense.  On today's study this measures approximately 13.4 x 9.7 x 10 cm.  Debris and septations are identified within this fluid collection.  The adrenal glands appear normal.  The right kidney is normal. There is a cyst noted within the upper pole the left kidney measuring 1.5 cm.  Several smaller T2 hyperintense structures are noted within the inferior pole the left kidney.  Diffuse edema is not noted involving the gastric wall. There is a mild amount of free fluid within the soft tissues surrounding the stomach and within the perihepatic space.  No upper abdominal adenopathy noted. Fluid signal intensity is identified within the soft tissues dorsal to the spine. This likely represents the dependent soft tissue edema.  IMPRESSION:  1.  No intrahepatic bile duct dilatation or common bile duct dilatation. 2.  The gallbladder wall edema. No gallstones identified.  If there is  a concern for cholecystitis then nuclear medicine hepatic biliary scan may be helpful to assess the patency of the cystic duct. 3.  Large, predominately T2 hyperintense subcapsular fluid collection encases the spleen.  This is of indeterminate etiology. This may represent a subacute to chronic subcapsular hematoma or abscess. 4.  Moderate bilateral pleural effusions.  5.  Diffuse edema of the gastric wall.   Original Report Authenticated By: Kerby Moors, M.D.     I have reviewed the patient's current medications.  Assessment/Plan: 1 AKI vol xs, edema, effusions.  Will allow to diruese. Bicarb is better and SNa is improving with current fluid. And base suppl.  Hopefully GI losses will lessen.  GFr plateau at about 30-35 2 UC 3 S/p colectomy and abscess 4 Anemia stable 5 Nutrition poor 6 ^^ lfts unclear exactly etio P hypotonic bicarb, ab follow drain vol, ostomy and urine    LOS: 26 days   Connor Small L 03/23/2012,10:45 AM

## 2012-03-24 ENCOUNTER — Inpatient Hospital Stay (HOSPITAL_COMMUNITY): Payer: BC Managed Care – PPO

## 2012-03-24 LAB — CBC
HCT: 31.6 % — ABNORMAL LOW (ref 39.0–52.0)
MCH: 31.5 pg (ref 26.0–34.0)
MCHC: 31 g/dL (ref 30.0–36.0)
MCV: 101.6 fL — ABNORMAL HIGH (ref 78.0–100.0)
Platelets: 51 10*3/uL — ABNORMAL LOW (ref 150–400)
RDW: 21.4 % — ABNORMAL HIGH (ref 11.5–15.5)
WBC: 12.4 10*3/uL — ABNORMAL HIGH (ref 4.0–10.5)

## 2012-03-24 LAB — RENAL FUNCTION PANEL
Albumin: 1.5 g/dL — ABNORMAL LOW (ref 3.5–5.2)
BUN: 93 mg/dL — ABNORMAL HIGH (ref 6–23)
Calcium: 7.7 mg/dL — ABNORMAL LOW (ref 8.4–10.5)
Creatinine, Ser: 2 mg/dL — ABNORMAL HIGH (ref 0.50–1.35)
GFR calc non Af Amer: 36 mL/min — ABNORMAL LOW (ref >90)
Phosphorus: 4.9 mg/dL — ABNORMAL HIGH (ref 2.3–4.6)

## 2012-03-24 LAB — HEPATIC FUNCTION PANEL
Albumin: 1.5 g/dL — ABNORMAL LOW (ref 3.5–5.2)
Alkaline Phosphatase: 793 U/L — ABNORMAL HIGH (ref 39–117)
Bilirubin, Direct: 14.2 mg/dL — ABNORMAL HIGH (ref 0.0–0.3)
Indirect Bilirubin: 4.4 mg/dL — ABNORMAL HIGH (ref 0.3–0.9)
Total Bilirubin: 18.6 mg/dL (ref 0.3–1.2)

## 2012-03-24 LAB — GLUCOSE, CAPILLARY
Glucose-Capillary: 48 mg/dL — ABNORMAL LOW (ref 70–99)
Glucose-Capillary: 72 mg/dL (ref 70–99)

## 2012-03-24 MED ORDER — SODIUM CHLORIDE 0.9 % IV BOLUS (SEPSIS)
500.0000 mL | Freq: Once | INTRAVENOUS | Status: AC
  Administered 2012-03-24: 500 mL via INTRAVENOUS

## 2012-03-24 MED ORDER — ASPIRIN EC 325 MG PO TBEC: 325.0000 mg | DELAYED_RELEASE_TABLET | Freq: Every day | ORAL | Status: DC

## 2012-03-24 MED ORDER — ENOXAPARIN SODIUM 40 MG/0.4ML ~~LOC~~ SOLN
40.0000 mg | SUBCUTANEOUS | Status: DC
  Filled 2012-03-24: qty 0.4

## 2012-03-24 MED ORDER — DEXTROSE 50 % IV SOLN
INTRAVENOUS | Status: AC
  Administered 2012-03-24: 25 mL
  Filled 2012-03-24: qty 50

## 2012-03-24 MED ORDER — RESOURCE THICKENUP CLEAR PO POWD
ORAL | Status: DC | PRN
  Filled 2012-03-24: qty 125

## 2012-03-24 NOTE — Progress Notes (Signed)
Speech Language Pathology Dysphagia Treatment Patient Details Name: Connor Small MRN: [id number] DOB: [birth year] Today's Date: 03/24/2012 Time: 1150-1203 SLP Time Calculation (min): 13 min  Assessment / Plan / Recommendation Clinical Impression  Patient seen for dysphagia therapy. Politely declined pos today reporting severe lethargy. Vocal quality noted to be hoarse and severely wet at baseline. Oral care provided to decrease risk of an aspiration related infection related to decreased secretion management. Max verbal, visual, and tactile cueing provided to patient for hard throat clear/cough and hard swallow utilizing chin tuck in attempts to clear suspected pharyngeal secretions and decrease aspiration risk. Vocal quality cleared following 3-4 hard, effortful swallows with chin tuck. SLP will continue to f/u closely for management of diet tolerance, education, and facilitation of recovery. Aspiration risk high at this time.     Diet Recommendation  Continue with Current Diet: Pudding-thick liquid;Dysphagia 1 (puree)    SLP Plan Continue with current plan of care   Pertinent Vitals/Pain None reported   Swallowing Goals  SLP Swallowing Goals Patient will utilize recommended strategies during swallow to increase swallowing safety with: Maximum assistance;Maximal cueing Swallow Study Goal #2 - Progress: Progressing toward goal  General Temperature Spikes Noted: No Respiratory Status: Supplemental O2 delivered via (comment) Behavior/Cognition: Lethargic;Requires cueing Oral Cavity - Dentition: Adequate natural dentition Patient Positioning: Upright in bed     Dysphagia Treatment Treatment focused on: Patient/family/caregiver education;Utilization of compensatory strategies;Facilitation of pharyngeal phase Treatment Methods/Modalities: Skilled observation;Differential diagnosis;Effortful swallow Patient observed directly with PO's: No (patient politely declined due to lethargy) Reason  PO's not observed: Lethargic Pharyngeal Phase Signs & Symptoms: Wet vocal quality Type of cueing: Verbal;Tactile;Visual Amount of cueing: Maximal   GO    [name] MA, CCC-SLP [phone]  Connor Small Connor Small 03/24/2012, 12:23 PM

## 2012-03-24 NOTE — Progress Notes (Signed)
Hypotension   500cc NS bolus ordered

## 2012-03-24 NOTE — Progress Notes (Signed)
4 Days Post-Op  Subjective: Denies abdominal pain  Objective: Vital signs in last 24 hours: Temp:  [97.1 F (36.2 C)-97.8 F (36.6 C)] 97.1 F (36.2 C) (03/01 0723) Pulse Rate:  [96-103] 103 (03/01 0351) Resp:  [17-20] 20 (03/01 0351) BP: (90-96)/(52-57) 95/56 mmHg (03/01 0723) SpO2:  [95 %-99 %] 98 % (03/01 0351) Last BM Date: 03/22/12  Intake/Output from previous day: 02/28 0701 - 03/01 0700 In: 3498.3 [I.V.:3498.3] Out: 2665 [Urine:1775; Drains:840; Stool:50] Intake/Output this shift: Total I/O In: 100 [I.V.:100] Out: 350 [Urine:250; Drains:100]  General appearance: cooperative Resp: clear to auscultation bilaterally Cardio: regular rate and rhythm GI: soft, ileostomy with large output, VAC in place, +BS, NT Skin: jaundice  Lab Results:   Recent Labs  03/23/12 0445 03/24/12 0355  WBC 12.7* 12.4*  HGB 9.5* 9.8*  HCT 30.9* 31.6*  PLT 50* 51*   BMET  Recent Labs  03/23/12 0445 03/24/12 0355  NA 142 142  K 4.3 4.4  CL 113* 112  CO2 20 20  GLUCOSE 73 65*  BUN 96* 93*  CREATININE 1.97* 2.00*  CALCIUM 7.7* 7.7*   PT/INR  Recent Labs  03/22/12 0428 03/23/12 0445  LABPROT 19.4* 19.8*  INR 1.70* 1.75*   ABG No results found for this basename: PHART, PCO2, PO2, HCO3,  in the last 72 hours  Studies/Results: Mr [name] Wo Contrast  03/22/2012  *RADIOLOGY REPORT*  Clinical Data:  52 year old male with altered mental status. Decreased right side movement.  Comparison: Head CTs 03/11/2012 and earlier.  MRI HEAD WITHOUT CONTRAST  Technique: Multiplanar, multiecho pulse sequences of the brain and surrounding structures were obtained according to standard protocol without intravenous contrast.  Findings: There are at least 14 small foci of restricted diffusion in the brain.  Seven of these are in the left hemisphere, mostly the left MCA / PCA watershed areas.  The remaining foci are identified in the right occipital and temporal lobes, right brainstem, and  right cerebellar hemisphere.  Associated T2 and FLAIR hyperintensity at these areas without mass effect. No acute intracranial hemorrhage identified.  Major intracranial vascular flow voids are preserved with dominant distal left vertebral artery.  MRA findings below.  No ventriculomegaly.  No intracranial mass effect or midline shift. Negative pituitary, cervicomedullary junction and visualized cervical spine.  Outside of the acute findings described above, there is only mild nonspecific cerebral white matter T2 and FLAIR hyperintensity.  Pulsation artifacts suspected in the cerebellum on the T1-weighted images.  Heterogeneous bone marrow signal without discrete destructive osseous lesion such as due to chronic disease or anemia.  Small mastoid effusions.  Small volume retained secretions in the nasopharynx.  Negative paranasal sinuses. Visualized orbit soft tissues are within normal limits.   Negative scalp soft tissues.  IMPRESSION: 1.  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  Favor sequelae of emboli from a cardiac or proximal aortic source.  Main alternate consideration is hypotensive episode. 2.  No mass effect or hemorrhage. 3.  MRA findings are below. 4.  Mastoid effusions and small volume retained secretions in the pharynx.  MRA HEAD WITHOUT CONTRAST  Technique: Angiographic images of the Circle of Willis were obtained using MRA technique without  intravenous contrast.  Findings: Study is moderately degraded by motion artifact despite repeated imaging attempts.  Antegrade flow in the dominant distal left vertebral artery.  There is antegrade flow detected in the nondominant distal right vertebral artery which probably functionally terminates in PICA.  The left vertebral artery supplies the basilar which is patent.  No definite basilar stenosis.  Left AICA, bilateral SCA and bilateral PCA origins are patent.  Right larger than left posterior  communicating arteries are evident.  Bilateral PCA branches are within normal limits.  Antegrade flow in both ICA siphons. No ICA stenosis identified. Ophthalmic artery origins and posterior communicating artery origins are within normal limits.  Carotid termini are patent.  MCA and ACA origins are within normal limits.  Diminutive anterior communicating artery.  Proximal ACA and MCA branches are patent. Distal branches are not well evaluated.  IMPRESSION:  No major circle of Willis branch occlusion or proximal intracranial stenosis identified.  Degraded by motion despite repeated imaging attempts.   Original Report Authenticated By: Roselyn Reef, M.D.    Mr Brain Wo Contrast  03/22/2012  *RADIOLOGY REPORT*  Clinical Data:  52 year old male with altered mental status. Decreased right side movement.  Comparison: Head CTs 03/11/2012 and earlier.  MRI HEAD WITHOUT CONTRAST  Technique: Multiplanar, multiecho pulse sequences of the brain and surrounding structures were obtained according to standard protocol without intravenous contrast.  Findings: There are at least 14 small foci of restricted diffusion in the brain.  Seven of these are in the left hemisphere, mostly the left MCA / PCA watershed areas.  The remaining foci are identified in the right occipital and temporal lobes, right brainstem, and right cerebellar hemisphere.  Associated T2 and FLAIR hyperintensity at these areas without mass effect. No acute intracranial hemorrhage identified.  Major intracranial vascular flow voids are preserved with dominant distal left vertebral artery.  MRA findings below.  No ventriculomegaly.  No intracranial mass effect or midline shift. Negative pituitary, cervicomedullary junction and visualized cervical spine.  Outside of the acute findings described above, there is only mild nonspecific cerebral white matter T2 and FLAIR hyperintensity.  Pulsation artifacts suspected in the cerebellum on the T1-weighted images.   Heterogeneous bone marrow signal without discrete destructive osseous lesion such as due to chronic disease or anemia.  Small mastoid effusions.  Small volume retained secretions in the nasopharynx.  Negative paranasal sinuses. Visualized orbit soft tissues are within normal limits.   Negative scalp soft tissues.  IMPRESSION: 1.  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  Favor sequelae of emboli from a cardiac or proximal aortic source.  Main alternate consideration is hypotensive episode. 2.  No mass effect or hemorrhage. 3.  MRA findings are below. 4.  Mastoid effusions and small volume retained secretions in the pharynx.  MRA HEAD WITHOUT CONTRAST  Technique: Angiographic images of the Circle of Willis were obtained using MRA technique without  intravenous contrast.  Findings: Study is moderately degraded by motion artifact despite repeated imaging attempts.  Antegrade flow in the dominant distal left vertebral artery.  There is antegrade flow detected in the nondominant distal right vertebral artery which probably functionally terminates in PICA. The left vertebral artery supplies the basilar which is patent.  No definite basilar stenosis.  Left AICA, bilateral SCA and bilateral PCA origins are patent.  Right larger than left posterior communicating arteries are evident.  Bilateral PCA branches are within normal limits.  Antegrade flow in both ICA siphons. No ICA stenosis identified. Ophthalmic artery origins and posterior communicating artery origins are within normal limits.  Carotid termini are patent.  MCA and ACA origins are within normal limits.  Diminutive anterior communicating artery.  Proximal ACA and MCA branches are patent. Distal  branches are not well evaluated.  IMPRESSION:  No major circle of Willis branch occlusion or proximal intracranial stenosis identified.  Degraded by motion despite repeated imaging attempts.   Original Report  Authenticated By: Roselyn Reef, M.D.    Mr Mrcp  03/23/2012  *RADIOLOGY REPORT*  Clinical Data:  Evaluate for bile duct obstruction.  MRI ABDOMEN WITHOUT CONTRAST (INCLUDING MRCP)  Technique:  Multiplanar multisequence MR imaging of the abdomen was performed both without the administration of intravenous contrast. Heavily T2-weighted images of the biliary and pancreatic ducts were obtained, and three-dimensional MRCP images were rendered by post processing.  Comparison:  03/11/2012  Findings:  Moderate sized bilateral pleural effusions identified.  There are no focal liver abnormalities identified.  Gallbladder wall appears edematous measuring up to 6.5 mm in thickness.  No stone identified within the lumen of the gallbladder.  There is no significant intrahepatic biliary ductal dilatation.  The common bile duct has a normal caliber.  No pancreatic duct dilatation. No pancreatic pseudocyst identified.   There is a large subcapsular splenic fluid collection identified. This is predominately T2 hyperintense.  On today's study this measures approximately 13.4 x 9.7 x 10 cm.  Debris and septations are identified within this fluid collection.  The adrenal glands appear normal.  The right kidney is normal. There is a cyst noted within the upper pole the left kidney measuring 1.5 cm.  Several smaller T2 hyperintense structures are noted within the inferior pole the left kidney.  Diffuse edema is not noted involving the gastric wall. There is a mild amount of free fluid within the soft tissues surrounding the stomach and within the perihepatic space.  No upper abdominal adenopathy noted. Fluid signal intensity is identified within the soft tissues dorsal to the spine. This likely represents the dependent soft tissue edema.  IMPRESSION:  1.  No intrahepatic bile duct dilatation or common bile duct dilatation. 2.  The gallbladder wall edema. No gallstones identified.  If there is a concern for cholecystitis then nuclear  medicine hepatic biliary scan may be helpful to assess the patency of the cystic duct. 3.  Large, predominately T2 hyperintense subcapsular fluid collection encases the spleen.  This is of indeterminate etiology. This may represent a subacute to chronic subcapsular hematoma or abscess. 4.  Moderate bilateral pleural effusions.  5.  Diffuse edema of the gastric wall.   Original Report Authenticated By: Kerby Moors, M.D.    Dg Chest Port 1 View  03/24/2012  *RADIOLOGY REPORT*  Clinical Data: Follow-up pleural effusions.  PORTABLE CHEST - 1 VIEW  Comparison: 03/19/2012.  Findings: 0601 hours.  The right IJ central venous catheter and nasogastric tube have been removed.  The left IJ central venous catheter appears unchanged.  There is increased opacity inferiorly in the left hemithorax consistent with an enlarging left pleural effusion and associated basilar air space disease/atelectasis. There is also a small right pleural effusion with increased right basilar pulmonary opacity.  No pneumothorax is identified.  IMPRESSION: Worsening left greater than right pleural effusions and associated bibasilar air space opacities.  The opacities could reflect atelectasis or pneumonia.   Original Report Authenticated By: Richardean Sale, M.D.     Anti-infectives: Anti-infectives   Start     Dose/Rate Route Frequency Ordered Stop   03/20/12 1145  ciprofloxacin (CIPRO) IVPB 400 mg    Comments:  ON CALL TO OR   400 mg 200 mL/hr over 60 Minutes Intravenous  Once 03/20/12 1143 03/20/12 1722   03/18/12 1000  vancomycin (VANCOCIN) 1,500 mg in sodium chloride 0.9 % 500 mL IVPB  Status:  Discontinued     1,500 mg 250 mL/hr over 120 Minutes Intravenous Every 48 hours 03/17/12 1920 03/19/12 0840   03/14/12 1000  micafungin (MYCAMINE) 100 mg in sodium chloride 0.9 % 100 mL IVPB  Status:  Discontinued     100 mg 100 mL/hr over 1 Hours Intravenous Daily 03/13/12 0632 03/19/12 0840   03/13/12 1000  micafungin (MYCAMINE) 100 mg  in sodium chloride 0.9 % 100 mL IVPB     100 mg 100 mL/hr over 1 Hours Intravenous Daily 03/13/12 0632 03/13/12 1311   03/12/12 1800  vancomycin (VANCOCIN) IVPB 1000 mg/200 mL premix  Status:  Discontinued     1,000 mg 200 mL/hr over 60 Minutes Intravenous Every 24 hours 03/12/12 0859 03/17/12 1017   03/12/12 1000  imipenem-cilastatin (PRIMAXIN) 500 mg in sodium chloride 0.9 % 100 mL IVPB  Status:  Discontinued     500 mg 200 mL/hr over 30 Minutes Intravenous 3 times per day 03/12/12 0848 03/12/12 0909   03/12/12 1000  imipenem-cilastatin (PRIMAXIN) 250 mg in sodium chloride 0.9 % 100 mL IVPB  Status:  Discontinued     250 mg 200 mL/hr over 30 Minutes Intravenous 4 times per day 03/12/12 0909 03/19/12 0841   03/11/12 1200  vancomycin (VANCOCIN) 750 mg in sodium chloride 0.9 % 150 mL IVPB  Status:  Discontinued     750 mg 150 mL/hr over 60 Minutes Intravenous Every 12 hours 03/11/12 0204 03/12/12 0858   03/11/12 0215  vancomycin (VANCOCIN) 750 mg in sodium chloride 0.9 % 150 mL IVPB     750 mg 150 mL/hr over 60 Minutes Intravenous  Once 03/11/12 0204 03/11/12 0352   03/11/12 0215  aztreonam (AZACTAM) 1 g in dextrose 5 % 50 mL IVPB  Status:  Discontinued     1 g 100 mL/hr over 30 Minutes Intravenous 3 times per day 03/11/12 0204 03/12/12 0851   03/05/12 2200  metroNIDAZOLE (FLAGYL) IVPB 500 mg  Status:  Discontinued    Comments:  First dose ASAP   500 mg 100 mL/hr over 60 Minutes Intravenous Every 8 hours 03/05/12 1346 03/19/12 1509   03/02/12 1200  vancomycin (VANCOCIN) 50 mg/mL oral solution 500 mg  Status:  Discontinued     500 mg Oral 4 times per day 03/02/12 0913 03/19/12 1509   03/01/12 1445  vancomycin (VANCOCIN) 50 mg/mL oral solution 500 mg  Status:  Discontinued     500 mg Oral 3 times per day 03/01/12 1340 03/02/12 0913   03/01/12 1400  aztreonam (AZACTAM) 1 g in dextrose 5 % 50 mL IVPB  Status:  Discontinued     1 g 100 mL/hr over 30 Minutes Intravenous 3 times per day  03/01/12 1356 03/02/12 1146   03/01/12 1300  vancomycin (VANCOCIN) 1,250 mg in sodium chloride 0.9 % 250 mL IVPB  Status:  Discontinued     1,250 mg 166.7 mL/hr over 90 Minutes Intravenous Every 12 hours 03/01/12 1127 03/02/12 1146   02/29/12 0600  ciprofloxacin (CIPRO) IVPB 400 mg     400 mg 200 mL/hr over 60 Minutes Intravenous On call to O.R. 02/28/12 1409 02/28/12 1438   02/28/12 2200  vancomycin (VANCOCIN) 500 mg in sodium chloride irrigation 0.9 % 60 mL ENEMA  Status:  Discontinued     500 mg Rectal 2 times daily 02/28/12 1504 02/28/12 1817   02/28/12 1200  vancomycin (VANCOCIN) 50 mg/mL oral  solution 500 mg  Status:  Discontinued    Comments:  First dose ASAP   500 mg Per Tube 4 times per day 02/28/12 0814 02/28/12 1817   02/28/12 1000  vancomycin (VANCOCIN) IVPB 1000 mg/200 mL premix  Status:  Discontinued     1,000 mg 200 mL/hr over 60 Minutes Intravenous Every 12 hours 02/28/12 0845 03/01/12 1127   02/28/12 1000  vancomycin (VANCOCIN) 500 mg in sodium chloride irrigation 0.9 % 100 mL ENEMA  Status:  Discontinued     500 mg Rectal 2 times daily 02/28/12 0923 02/28/12 1504   02/27/12 1600  metroNIDAZOLE (FLAGYL) IVPB 500 mg  Status:  Discontinued     500 mg 100 mL/hr over 60 Minutes Intravenous Every 8 hours 02/27/12 1546 02/27/12 2000   02/27/12 1600  ciprofloxacin (CIPRO) IVPB 400 mg  Status:  Discontinued     400 mg 200 mL/hr over 60 Minutes Intravenous Every 12 hours 02/27/12 1547 02/27/12 2000   02/27/12 1545  metroNIDAZOLE (FLAGYL) IVPB 500 mg  Status:  Discontinued     500 mg 100 mL/hr over 60 Minutes Intravenous  Once 02/27/12 1535 02/28/12 0843   02/27/12 1545  vancomycin (VANCOCIN) 500 mg in sodium chloride 0.9 % 100 mL IVPB  Status:  Discontinued     500 mg 100 mL/hr over 60 Minutes Intravenous  Once 02/27/12 1535 02/27/12 1538   02/27/12 1545  ciprofloxacin (CIPRO) IVPB 400 mg  Status:  Discontinued     400 mg 200 mL/hr over 60 Minutes Intravenous  Once 02/27/12  1538 02/28/12 0843   02/27/12 1415  vancomycin (VANCOCIN) 500 mg in sodium chloride 0.9 % 100 mL IVPB     500 mg 100 mL/hr over 60 Minutes Intravenous  Once 02/27/12 1402 02/27/12 1411   02/27/12 1400  vancomycin (VANCOCIN) powder 500 mg  Status:  Discontinued     500 mg Other To Surgery 02/27/12 1359 02/27/12 1402   02/27/12 1200  metroNIDAZOLE (FLAGYL) IVPB 500 mg  Status:  Discontinued    Comments:  First dose ASAP   500 mg 100 mL/hr over 60 Minutes Intravenous Every 6 hours 02/27/12 0953 03/05/12 1346   02/27/12 1200  vancomycin (VANCOCIN) 50 mg/mL oral solution 250 mg  Status:  Discontinued    Comments:  First dose ASAP   250 mg Oral 4 times per day 02/27/12 0953 02/28/12 0814   02/26/12 1700  oseltamivir (TAMIFLU) capsule 75 mg     75 mg Oral  Once 02/26/12 1651 02/26/12 1715      Assessment/Plan: s/p Procedure(s): CYSTOSCOPY WITH RETROGRADE PYELOGRAM/URETERAL STENT PLACEMENT (Bilateral) 1. S/p total colectomy for c diff, ileostomy 2. Hyperbilirubinemia - steotactic hepatopathy, appreciate Dr. Amedeo Plenty F/U 3. Multiple small infarcts of brain 4. Wound dehiscence 5. PCM  Plan:  1. Continue with wound VAC, change Monday  2. Speech therapy eval 3. PT/OT I spoke with his family member at the bedside and answered her questions  LOS: 27 days    Nasiah Lehenbauer E 03/24/2012

## 2012-03-24 NOTE — Progress Notes (Signed)
PULMONARY  / CRITICAL CARE MEDICINE  Name: Connor Small MRN: [id number] DOB: 07/19/1960    ADMISSION DATE:  02/26/2012 CONSULTATION DATE:  2/5  REFERRING MD :  Karilyn Cota  CHIEF COMPLAINT:  Acute resp failure   BRIEF PATIENT DESCRIPTION:  59 YOM admitted to [facility] for Cdiff on 2/2. Underwent decompressive cecostomy 2/3, initially was better, then worsened on 2/4 with increased abdominal distension and tenderness, decreased renal function.  He was taken back to the OR on 2/4 for total abdominal colectomy and end ileostomy.  Post op he developed ARDS and septic shock and was being treated for this.  The morning of 2/6 he had new onset of A-fib and transferred to [facility].    SIGNIFICANT EVENTS / STUDIES:  2/2 Admit to [facility] with C diff, toxic megacolon 2/3 Decompressive Cecostomy 2/4 Total abdominal colectomy and end ileostomy 2/6 New A-fib and transfer to [facility]. 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 2/10 CT abd/pelvis: small hematoma likely subcapsular spleen, JPs wnl 2/11 trial TFs  2/11 UE venous dopplers: Findings consistent with superficial vein thrombosis involving the right basilic vein 2/14 LE venous dopplers: No evidence of deep vein thrombosis 2/14 LUE venous doppler: No evidence of deep vein or superficial thrombosis involving the left upper extremity  2/15 neg 4 l bal on lasix drip, lasix d/c  2/16 poor neuro status, CT head: [facility] 2/17 CRRT initiated, high pressor needs 2/17 RUQ [place]: Gallbladder wall thickening in the absence of sludge or stones but no biliary ductal dilatation 2/18 reduced vasopressor reqts 2/19 New onset thrombocytopenia. Heparin D/C'd. Argatroban initiated 2/19 [place] abd (repeat): No explanation for elevated liver function tests 2/20 reduction pressors, improved alertness, extubated 2/20 LE venous dopplers: No  evidence of deep vein or superficial thrombosis 2/21 bleeding overnight from JP's, argatroban turned off, lower pressor needs 2/22 off pressors, on RA, converted to NSR 2/22 Carotid dopplers: No significant extracranial carotid artery stenosis demonstrated 2/25 Cystoscopy with bilateral retrograde pyelograms. No extravasation of contrast from either ureteral orifice. No evidence of hydroureteronephrosis. Unremarkable urinary bladder. B ureteral stents placed 2/27 MRI brain: Scattered small acute to subacute infarcts in both cerebral hemispheres, brainstem, and right cerebellum. Favor sequelae of emboli from a cardiac or proximal aortic source. Main alternate consideration is hypotensive episode 2/27 MRCP: No intrahepatic bile duct dilatation or common bile duct dilatation. No gallstones identified. Large subcapsular fluid collection encases the spleen. This is of indeterminate etiology. This may represent a subacute to chronic subcapsular hematoma or abscess. 2/28 SLP eval: FEES performed. Oropharyngeal weakness noted. Recommend replacement of NGT to supplement PO intake 3/1 Renal signed off  LINES / TUBES: ETT ([facility]) 2/4>>> 2/20 PICC ([facility]) 2/4>>>2/11 Art Line ([facility]) 2/5>>>out L IJ CVL 2/06 >> out A line R fem 2/18>>>2/24 Rt ij HD 2/17 >> out Left IJ 2/18 >> 2/28 (ordered)  CULTURES: BCx2 2/4>>>negative BCx2 2/3>>>negative UC 2/3- Negative MRSA PCR 2/3- Negative C diff 2/6>>>Positive Body fluid 2/9>>>WBCs, no organisms 2/15 BC x 2 >> NEG 2/15 Sputum >>>NF  ANTIBIOTICS: Flagyl 2/3>> d/c'd Vanc 2/6 (IV) >>>2/7 azactam 2/6>>>2/7 Oral vanc 2/6>>> IV vanc 2/16 >2/24 Aztreonam 2/16 >2/17 Imipenem 2/17>>>2/24 mycofungin 2/18>>>2/24  SUBJECTIVE:    No distress. Very weak.   VITAL SIGNS: Temp:  [97.1 F (36.2 C)-98 F (36.7 C)] 98 F (36.7 C) (03/01 1200) Pulse Rate:  [96-107] 107 (03/01 1315)  Resp:  [17-20] 20 (03/01 1315) BP: (83-96)/(43-60) 86/43 mmHg (03/01 1315) SpO2:  [95  %-99 %] 99 % (03/01 1315) 02 rx=  RA   INTAKE / OUTPUT: Intake/Output     02/28 0701 - 03/01 0700 03/01 0701 - 03/02 0700   I.V. (mL/kg) 3498.3 (41.4) 260 (3.1)   Total Intake(mL/kg) 3498.3 (41.4) 260 (3.1)   Urine (mL/kg/hr) 1775 (0.9) 500 (0.9)   Drains 840 (0.4) 365 (0.7)   Stool 50 (0)    Total Output 2665 865   Net +833.3 -605          PHYSICAL EXAMINATION: General Icteric, NAD Neuro: diffusely weak, no focal deficits HEENT: Sclericterus, EOMI, PERRL Cardiovascular: RRR s M Lungs: clear anteriorly Abdomen: soft, NT, + BS, wound vac in place, abdominal binder Musculoskeletal: 2+ LE edema  Skin: rash improved  LABS: BMET    Component Value Date/Time   NA 142 03/24/2012 0355   K 4.4 03/24/2012 0355   CL 112 03/24/2012 0355   CO2 20 03/24/2012 0355   GLUCOSE 65* 03/24/2012 0355   BUN 93* 03/24/2012 0355   CREATININE 2.00* 03/24/2012 0355   CALCIUM 7.7* 03/24/2012 0355   GFRNONAA 36* 03/24/2012 0355   GFRAA 42* 03/24/2012 0355    Hepatic Function Panel     Component Value Date/Time   PROT 3.4* 03/24/2012 0355   ALBUMIN 1.5* 03/24/2012 0355   ALBUMIN 1.5* 03/24/2012 0355   AST 270* 03/24/2012 0355   ALT 237* 03/24/2012 0355   ALKPHOS 793* 03/24/2012 0355   BILITOT 18.6* 03/24/2012 0355   BILIDIR 14.2* 03/24/2012 0355   IBILI 4.4* 03/24/2012 0355    CBC    Component Value Date/Time   WBC 12.4* 03/24/2012 0355   RBC 3.11* 03/24/2012 0355   HGB 9.8* 03/24/2012 0355   HCT 31.6* 03/24/2012 0355   PLT 51* 03/24/2012 0355   MCV 101.6* 03/24/2012 0355   MCH 31.5 03/24/2012 0355   MCHC 31.0 03/24/2012 0355   RDW 21.4* 03/24/2012 0355   LYMPHSABS 0.4* 03/22/2012 0428   MONOABS 0.3 03/22/2012 0428   EOSABS 0.0 03/22/2012 0428   BASOSABS 0.0 03/22/2012 0428      Recent Labs Lab 03/23/12 1958 03/23/12 2205 03/24/12 0605 03/24/12 0638 03/24/12 1306  GLUCAP 81 77 48* 95 72    CXR:  No new film  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, C diff  Prolonged  complicated critical illness  Active Problems:   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 No evidence of active blood loss. No indication for PRBCs   Small splenic hematoma   Relative adrenal insufficiency   Dysphagia due to oropharyngeal weakness  Resolved Problems:    history of UC (ulcerative colitis)   Atrial fibrillation, resolved   Acute respiratory failure, resolved   Septic shock, resolved   RUE DVT related to PICC, resolved   Hypokalemia, resolved  Hyperglycemia, resolved   Acute encephalopathy, resolved    PLAN: Needs to stay in SDU due to high level of nursing care required  Cont PT/OT Will discuss R shoulder subluxation with Ortho Monitor hepatic function panel hold off on feeding tube for now.  If not meeting > 50% of caloric needs by Monday 3/3, would consider small bore tube @ that time Monitor CBC, coags  Avoiding all anticoagulants if possible due to high risk of bleed Correct free water/HCO3 deficit - renal signed off 3/1 Tapering  hydrocortisone to off    Sandrea Hughs, MD Pulmonary and Critical Care Medicine [facility] Cell [phone] After 5:30 PM or weekends, call [phone]

## 2012-03-24 NOTE — Progress Notes (Signed)
CRITICAL VALUE ALERT  Critical value received:  Total Bilirubin 18.6  Date of notification:  03/24/2012  Time of notification:  0500  Critical value read back:yes  Nurse who received alert:  T. Decareaux  MD notified (1st page):  Dr. Jimmy Footman  Time of first page:  [phone]  MD notified (2nd page):  Time of second page:  Responding MD:  Dr. Jimmy Footman  Time MD responded:  [phone]

## 2012-03-24 NOTE — Progress Notes (Signed)
[facility] PROGRESS NOTE Subjective Pt critically ill after ST colectomy due to toxic megacolon due to c diff and UC. LFTs remain elevated. ANA and AMA negative. MRCP shows no extrahepatic obstruction  Objective: Vital signs in last 24 hours: Temp:  [97.1 F (36.2 C)-98 F (36.7 C)] 98 F (36.7 C) (03/01 1110) Pulse Rate:  [96-103] 103 (03/01 0351) Resp:  [17-20] 20 (03/01 0351) BP: (90-96)/(52-57) 95/56 mmHg (03/01 0723) SpO2:  [95 %-99 %] 98 % (03/01 0351) Last BM Date: 03/22/12  Intake/Output from previous day: 02/28 0701 - 03/01 0700 In: 3498.3 [I.V.:3498.3] Out: 2665 [Urine:1775; Drains:840; Stool:50] Intake/Output this shift: Total I/O In: 110 [I.V.:110] Out: 740 [Urine:500; Drains:240]    Lab Results:  Recent Labs  03/22/12 0428 03/23/12 0445 03/24/12 0355  WBC 10.7* 12.7* 12.4*  HGB 9.4* 9.5* 9.8*  HCT 30.5* 30.9* 31.6*  PLT 44* 50* 51*   BMET  Recent Labs  03/22/12 0428 03/23/12 0445 03/24/12 0355  NA 147* 142 142  K 4.4 4.3 4.4  CL 120* 113* 112  CO2 18* 20 20  CREATININE 1.99* 1.97* 2.00*   LFT  Recent Labs  03/22/12 0428 03/23/12 0445 03/24/12 0355  PROT 3.4* 3.4* 3.4*  AST 281* 280* 270*  ALT 185* 217* 237*  ALKPHOS 680* 693* 793*  BILITOT 16.9* 18.0* 18.6*  BILIDIR  --   --  14.2*  IBILI  --   --  4.4*   PT/INR  Recent Labs  03/22/12 0428 03/23/12 0445  LABPROT 19.4* 19.8*  INR 1.70* 1.75*   PANCREAS No results found for this basename: LIPASE,  in the last 72 hours       Studies/Results: Mr [name] Wo Contrast  03/22/2012  *RADIOLOGY REPORT*  Clinical Data:  52 year old male with altered mental status. Decreased right side movement.  Comparison: Head CTs 03/11/2012 and earlier.  MRI HEAD WITHOUT CONTRAST  Technique: Multiplanar, multiecho pulse sequences of the brain and surrounding structures were obtained according to standard protocol without intravenous contrast.  Findings: There are at least 14 small  foci of restricted diffusion in the brain.  Seven of these are in the left hemisphere, mostly the left MCA / PCA watershed areas.  The remaining foci are identified in the right occipital and temporal lobes, right brainstem, and right cerebellar hemisphere.  Associated T2 and FLAIR hyperintensity at these areas without mass effect. No acute intracranial hemorrhage identified.  Major intracranial vascular flow voids are preserved with dominant distal left vertebral artery.  MRA findings below.  No ventriculomegaly.  No intracranial mass effect or midline shift. Negative pituitary, cervicomedullary junction and visualized cervical spine.  Outside of the acute findings described above, there is only mild nonspecific cerebral white matter T2 and FLAIR hyperintensity.  Pulsation artifacts suspected in the cerebellum on the T1-weighted images.  Heterogeneous bone marrow signal without discrete destructive osseous lesion such as due to chronic disease or anemia.  Small mastoid effusions.  Small volume retained secretions in the nasopharynx.  Negative paranasal sinuses. Visualized orbit soft tissues are within normal limits.   Negative scalp soft tissues.  IMPRESSION: 1.  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  Favor sequelae of emboli from a cardiac or proximal aortic source.  Main alternate consideration is hypotensive episode. 2.  No mass effect or hemorrhage. 3.  MRA findings are below. 4.  Mastoid effusions and small volume retained secretions in the pharynx.  MRA HEAD WITHOUT CONTRAST  Technique:  Angiographic images of the Circle of Willis were obtained using MRA technique without  intravenous contrast.  Findings: Study is moderately degraded by motion artifact despite repeated imaging attempts.  Antegrade flow in the dominant distal left vertebral artery.  There is antegrade flow detected in the nondominant distal right vertebral artery which probably  functionally terminates in PICA. The left vertebral artery supplies the basilar which is patent.  No definite basilar stenosis.  Left AICA, bilateral SCA and bilateral PCA origins are patent.  Right larger than left posterior communicating arteries are evident.  Bilateral PCA branches are within normal limits.  Antegrade flow in both ICA siphons. No ICA stenosis identified. Ophthalmic artery origins and posterior communicating artery origins are within normal limits.  Carotid termini are patent.  MCA and ACA origins are within normal limits.  Diminutive anterior communicating artery.  Proximal ACA and MCA branches are patent. Distal branches are not well evaluated.  IMPRESSION:  No major circle of Willis branch occlusion or proximal intracranial stenosis identified.  Degraded by motion despite repeated imaging attempts.   Original Report Authenticated By: Roselyn Reef, M.D.    Mr Brain Wo Contrast  03/22/2012  *RADIOLOGY REPORT*  Clinical Data:  52 year old male with altered mental status. Decreased right side movement.  Comparison: Head CTs 03/11/2012 and earlier.  MRI HEAD WITHOUT CONTRAST  Technique: Multiplanar, multiecho pulse sequences of the brain and surrounding structures were obtained according to standard protocol without intravenous contrast.  Findings: There are at least 14 small foci of restricted diffusion in the brain.  Seven of these are in the left hemisphere, mostly the left MCA / PCA watershed areas.  The remaining foci are identified in the right occipital and temporal lobes, right brainstem, and right cerebellar hemisphere.  Associated T2 and FLAIR hyperintensity at these areas without mass effect. No acute intracranial hemorrhage identified.  Major intracranial vascular flow voids are preserved with dominant distal left vertebral artery.  MRA findings below.  No ventriculomegaly.  No intracranial mass effect or midline shift. Negative pituitary, cervicomedullary junction and visualized  cervical spine.  Outside of the acute findings described above, there is only mild nonspecific cerebral white matter T2 and FLAIR hyperintensity.  Pulsation artifacts suspected in the cerebellum on the T1-weighted images.  Heterogeneous bone marrow signal without discrete destructive osseous lesion such as due to chronic disease or anemia.  Small mastoid effusions.  Small volume retained secretions in the nasopharynx.  Negative paranasal sinuses. Visualized orbit soft tissues are within normal limits.   Negative scalp soft tissues.  IMPRESSION: 1.  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  Favor sequelae of emboli from a cardiac or proximal aortic source.  Main alternate consideration is hypotensive episode. 2.  No mass effect or hemorrhage. 3.  MRA findings are below. 4.  Mastoid effusions and small volume retained secretions in the pharynx.  MRA HEAD WITHOUT CONTRAST  Technique: Angiographic images of the Circle of Willis were obtained using MRA technique without  intravenous contrast.  Findings: Study is moderately degraded by motion artifact despite repeated imaging attempts.  Antegrade flow in the dominant distal left vertebral artery.  There is antegrade flow detected in the nondominant distal right vertebral artery which probably functionally terminates in PICA. The left vertebral artery supplies the basilar which is patent.  No definite basilar stenosis.  Left AICA, bilateral SCA and bilateral PCA origins are patent.  Right larger than left posterior communicating arteries are evident.  Bilateral PCA branches are within normal limits.  Antegrade flow in both ICA siphons. No ICA stenosis identified. Ophthalmic artery origins and posterior communicating artery origins are within normal limits.  Carotid termini are patent.  MCA and ACA origins are within normal limits.  Diminutive anterior communicating artery.  Proximal ACA and MCA branches are  patent. Distal branches are not well evaluated.  IMPRESSION:  No major circle of Willis branch occlusion or proximal intracranial stenosis identified.  Degraded by motion despite repeated imaging attempts.   Original Report Authenticated By: Roselyn Reef, M.D.    Mr Mrcp  03/23/2012  *RADIOLOGY REPORT*  Clinical Data:  Evaluate for bile duct obstruction.  MRI ABDOMEN WITHOUT CONTRAST (INCLUDING MRCP)  Technique:  Multiplanar multisequence MR imaging of the abdomen was performed both without the administration of intravenous contrast. Heavily T2-weighted images of the biliary and pancreatic ducts were obtained, and three-dimensional MRCP images were rendered by post processing.  Comparison:  03/11/2012  Findings:  Moderate sized bilateral pleural effusions identified.  There are no focal liver abnormalities identified.  Gallbladder wall appears edematous measuring up to 6.5 mm in thickness.  No stone identified within the lumen of the gallbladder.  There is no significant intrahepatic biliary ductal dilatation.  The common bile duct has a normal caliber.  No pancreatic duct dilatation. No pancreatic pseudocyst identified.   There is a large subcapsular splenic fluid collection identified. This is predominately T2 hyperintense.  On today's study this measures approximately 13.4 x 9.7 x 10 cm.  Debris and septations are identified within this fluid collection.  The adrenal glands appear normal.  The right kidney is normal. There is a cyst noted within the upper pole the left kidney measuring 1.5 cm.  Several smaller T2 hyperintense structures are noted within the inferior pole the left kidney.  Diffuse edema is not noted involving the gastric wall. There is a mild amount of free fluid within the soft tissues surrounding the stomach and within the perihepatic space.  No upper abdominal adenopathy noted. Fluid signal intensity is identified within the soft tissues dorsal to the spine. This likely represents the  dependent soft tissue edema.  IMPRESSION:  1.  No intrahepatic bile duct dilatation or common bile duct dilatation. 2.  The gallbladder wall edema. No gallstones identified.  If there is a concern for cholecystitis then nuclear medicine hepatic biliary scan may be helpful to assess the patency of the cystic duct. 3.  Large, predominately T2 hyperintense subcapsular fluid collection encases the spleen.  This is of indeterminate etiology. This may represent a subacute to chronic subcapsular hematoma or abscess. 4.  Moderate bilateral pleural effusions.  5.  Diffuse edema of the gastric wall.   Original Report Authenticated By: Kerby Moors, M.D.    Dg Chest Port 1 View  03/24/2012  *RADIOLOGY REPORT*  Clinical Data: Follow-up pleural effusions.  PORTABLE CHEST - 1 VIEW  Comparison: 03/19/2012.  Findings: 0601 hours.  The right IJ central venous catheter and nasogastric tube have been removed.  The left IJ central venous catheter appears unchanged.  There is increased opacity inferiorly in the left hemithorax consistent with an enlarging left pleural effusion and associated basilar air space disease/atelectasis. There is also a small right pleural effusion with increased right basilar pulmonary opacity.  No pneumothorax is identified.  IMPRESSION: Worsening left greater than right pleural effusions and associated bibasilar air space opacities.  The opacities could reflect atelectasis or pneumonia.   Original Report Authenticated By: Richardean Sale,  M.D.     Medications: I have reviewed the patient's current medications  Assessment/Plan: 1. Cholestatic jaundice probably due to reactive hepatopathy resulting from sepsis and multiple problems. No biliary obstruction, PSC, PBC identified with current work up. Hopefully will improve slowly with treatment of underlying problems.   Cece Milhouse JR,Anastyn Ayars L 03/24/2012, 12:33 PM

## 2012-03-24 NOTE — Progress Notes (Signed)
Subjective: Interval History: none.  Objective: Vital signs in last 24 hours: Temp:  [97.1 F (36.2 C)-97.8 F (36.6 C)] 97.1 F (36.2 C) (03/01 0723) Pulse Rate:  [96-103] 103 (03/01 0351) Resp:  [17-20] 20 (03/01 0351) BP: (90-96)/(52-57) 95/56 mmHg (03/01 0723) SpO2:  [95 %-99 %] 98 % (03/01 0351) Weight change:   Intake/Output from previous day: 02/28 0701 - 03/01 0700 In: 3498.3 [I.V.:3498.3] Out: 2665 [Urine:1775; Drains:840; Stool:50] Intake/Output this shift: Total I/O In: 100 [I.V.:100] Out: 350 [Urine:250; Drains:100]  General appearance: icteric, slowed mentation and does speak today Resp: diminished breath sounds bilaterally and rales bibasilar Cardio: S1, S2 normal and systolic murmur: holosystolic 2/6, blowing at apex GI: pos bs, wound and vac in midline, drain below,ostomy on R Extremities: edema 3+  Lab Results:  Recent Labs  03/23/12 0445 03/24/12 0355  WBC 12.7* 12.4*  HGB 9.5* 9.8*  HCT 30.9* 31.6*  PLT 50* 51*   BMET:  Recent Labs  03/23/12 0445 03/24/12 0355  NA 142 142  K 4.3 4.4  CL 113* 112  CO2 20 20  GLUCOSE 73 65*  BUN 96* 93*  CREATININE 1.97* 2.00*  CALCIUM 7.7* 7.7*   No results found for this basename: PTH,  in the last 72 hours Iron Studies: No results found for this basename: IRON, TIBC, TRANSFERRIN, FERRITIN,  in the last 72 hours  Studies/Results: Mr [name] Wo Contrast  03/22/2012  *RADIOLOGY REPORT*  Clinical Data:  52 year old male with altered mental status. Decreased right side movement.  Comparison: Head CTs 03/11/2012 and earlier.  MRI HEAD WITHOUT CONTRAST  Technique: Multiplanar, multiecho pulse sequences of the brain and surrounding structures were obtained according to standard protocol without intravenous contrast.  Findings: There are at least 14 small foci of restricted diffusion in the brain.  Seven of these are in the left hemisphere, mostly the left MCA / PCA watershed areas.  The remaining foci are  identified in the right occipital and temporal lobes, right brainstem, and right cerebellar hemisphere.  Associated T2 and FLAIR hyperintensity at these areas without mass effect. No acute intracranial hemorrhage identified.  Major intracranial vascular flow voids are preserved with dominant distal left vertebral artery.  MRA findings below.  No ventriculomegaly.  No intracranial mass effect or midline shift. Negative pituitary, cervicomedullary junction and visualized cervical spine.  Outside of the acute findings described above, there is only mild nonspecific cerebral white matter T2 and FLAIR hyperintensity.  Pulsation artifacts suspected in the cerebellum on the T1-weighted images.  Heterogeneous bone marrow signal without discrete destructive osseous lesion such as due to chronic disease or anemia.  Small mastoid effusions.  Small volume retained secretions in the nasopharynx.  Negative paranasal sinuses. Visualized orbit soft tissues are within normal limits.   Negative scalp soft tissues.  IMPRESSION: 1.  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  Favor sequelae of emboli from a cardiac or proximal aortic source.  Main alternate consideration is hypotensive episode. 2.  No mass effect or hemorrhage. 3.  MRA findings are below. 4.  Mastoid effusions and small volume retained secretions in the pharynx.  MRA HEAD WITHOUT CONTRAST  Technique: Angiographic images of the Circle of Willis were obtained using MRA technique without  intravenous contrast.  Findings: Study is moderately degraded by motion artifact despite repeated imaging attempts.  Antegrade flow in the dominant distal left vertebral artery.  There is antegrade flow detected in the nondominant distal right vertebral  artery which probably functionally terminates in PICA. The left vertebral artery supplies the basilar which is patent.  No definite basilar stenosis.  Left AICA, bilateral SCA  and bilateral PCA origins are patent.  Right larger than left posterior communicating arteries are evident.  Bilateral PCA branches are within normal limits.  Antegrade flow in both ICA siphons. No ICA stenosis identified. Ophthalmic artery origins and posterior communicating artery origins are within normal limits.  Carotid termini are patent.  MCA and ACA origins are within normal limits.  Diminutive anterior communicating artery.  Proximal ACA and MCA branches are patent. Distal branches are not well evaluated.  IMPRESSION:  No major circle of Willis branch occlusion or proximal intracranial stenosis identified.  Degraded by motion despite repeated imaging attempts.   Original Report Authenticated By: Roselyn Reef, M.D.    Mr Brain Wo Contrast  03/22/2012  *RADIOLOGY REPORT*  Clinical Data:  52 year old male with altered mental status. Decreased right side movement.  Comparison: Head CTs 03/11/2012 and earlier.  MRI HEAD WITHOUT CONTRAST  Technique: Multiplanar, multiecho pulse sequences of the brain and surrounding structures were obtained according to standard protocol without intravenous contrast.  Findings: There are at least 14 small foci of restricted diffusion in the brain.  Seven of these are in the left hemisphere, mostly the left MCA / PCA watershed areas.  The remaining foci are identified in the right occipital and temporal lobes, right brainstem, and right cerebellar hemisphere.  Associated T2 and FLAIR hyperintensity at these areas without mass effect. No acute intracranial hemorrhage identified.  Major intracranial vascular flow voids are preserved with dominant distal left vertebral artery.  MRA findings below.  No ventriculomegaly.  No intracranial mass effect or midline shift. Negative pituitary, cervicomedullary junction and visualized cervical spine.  Outside of the acute findings described above, there is only mild nonspecific cerebral white matter T2 and FLAIR hyperintensity.  Pulsation  artifacts suspected in the cerebellum on the T1-weighted images.  Heterogeneous bone marrow signal without discrete destructive osseous lesion such as due to chronic disease or anemia.  Small mastoid effusions.  Small volume retained secretions in the nasopharynx.  Negative paranasal sinuses. Visualized orbit soft tissues are within normal limits.   Negative scalp soft tissues.  IMPRESSION: 1.  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  Favor sequelae of emboli from a cardiac or proximal aortic source.  Main alternate consideration is hypotensive episode. 2.  No mass effect or hemorrhage. 3.  MRA findings are below. 4.  Mastoid effusions and small volume retained secretions in the pharynx.  MRA HEAD WITHOUT CONTRAST  Technique: Angiographic images of the Circle of Willis were obtained using MRA technique without  intravenous contrast.  Findings: Study is moderately degraded by motion artifact despite repeated imaging attempts.  Antegrade flow in the dominant distal left vertebral artery.  There is antegrade flow detected in the nondominant distal right vertebral artery which probably functionally terminates in PICA. The left vertebral artery supplies the basilar which is patent.  No definite basilar stenosis.  Left AICA, bilateral SCA and bilateral PCA origins are patent.  Right larger than left posterior communicating arteries are evident.  Bilateral PCA branches are within normal limits.  Antegrade flow in both ICA siphons. No ICA stenosis identified. Ophthalmic artery origins and posterior communicating artery origins are within normal limits.  Carotid termini are patent.  MCA and ACA origins are within normal limits.  Diminutive anterior communicating artery.  Proximal  ACA and MCA branches are patent. Distal branches are not well evaluated.  IMPRESSION:  No major circle of Willis branch occlusion or proximal intracranial stenosis identified.   Degraded by motion despite repeated imaging attempts.   Original Report Authenticated By: Roselyn Reef, M.D.    Mr Mrcp  03/23/2012  *RADIOLOGY REPORT*  Clinical Data:  Evaluate for bile duct obstruction.  MRI ABDOMEN WITHOUT CONTRAST (INCLUDING MRCP)  Technique:  Multiplanar multisequence MR imaging of the abdomen was performed both without the administration of intravenous contrast. Heavily T2-weighted images of the biliary and pancreatic ducts were obtained, and three-dimensional MRCP images were rendered by post processing.  Comparison:  03/11/2012  Findings:  Moderate sized bilateral pleural effusions identified.  There are no focal liver abnormalities identified.  Gallbladder wall appears edematous measuring up to 6.5 mm in thickness.  No stone identified within the lumen of the gallbladder.  There is no significant intrahepatic biliary ductal dilatation.  The common bile duct has a normal caliber.  No pancreatic duct dilatation. No pancreatic pseudocyst identified.   There is a large subcapsular splenic fluid collection identified. This is predominately T2 hyperintense.  On today's study this measures approximately 13.4 x 9.7 x 10 cm.  Debris and septations are identified within this fluid collection.  The adrenal glands appear normal.  The right kidney is normal. There is a cyst noted within the upper pole the left kidney measuring 1.5 cm.  Several smaller T2 hyperintense structures are noted within the inferior pole the left kidney.  Diffuse edema is not noted involving the gastric wall. There is a mild amount of free fluid within the soft tissues surrounding the stomach and within the perihepatic space.  No upper abdominal adenopathy noted. Fluid signal intensity is identified within the soft tissues dorsal to the spine. This likely represents the dependent soft tissue edema.  IMPRESSION:  1.  No intrahepatic bile duct dilatation or common bile duct dilatation. 2.  The gallbladder wall edema. No  gallstones identified.  If there is a concern for cholecystitis then nuclear medicine hepatic biliary scan may be helpful to assess the patency of the cystic duct. 3.  Large, predominately T2 hyperintense subcapsular fluid collection encases the spleen.  This is of indeterminate etiology. This may represent a subacute to chronic subcapsular hematoma or abscess. 4.  Moderate bilateral pleural effusions.  5.  Diffuse edema of the gastric wall.   Original Report Authenticated By: Kerby Moors, M.D.    Dg Chest Port 1 View  03/24/2012  *RADIOLOGY REPORT*  Clinical Data: Follow-up pleural effusions.  PORTABLE CHEST - 1 VIEW  Comparison: 03/19/2012.  Findings: 0601 hours.  The right IJ central venous catheter and nasogastric tube have been removed.  The left IJ central venous catheter appears unchanged.  There is increased opacity inferiorly in the left hemithorax consistent with an enlarging left pleural effusion and associated basilar air space disease/atelectasis. There is also a small right pleural effusion with increased right basilar pulmonary opacity.  No pneumothorax is identified.  IMPRESSION: Worsening left greater than right pleural effusions and associated bibasilar air space opacities.  The opacities could reflect atelectasis or pneumonia.   Original Report Authenticated By: Richardean Sale, M.D.     I have reviewed the patient's current medications.  Assessment/Plan: 1 AKI suspect now CKD. Bicarb low with stool loss.  On iv an po.  Needs slow diruesis and is losing about a pound a day 2 UC 3 Abscess 4 Wound 5 enceph/ cvas,  P cont ivf, will s/o at this time as am not adding a lot ,will see again at your request    LOS: 27 days   Carlos Quackenbush L 03/24/2012,10:40 AM

## 2012-03-24 NOTE — Progress Notes (Signed)
Notified md of patients low blood pressures. 80's/50-60's. Pt is asymptomatic. No orders received. If blood pressure continues to drop or patient is symptomatic will repage MD. Will continue to monitor.

## 2012-03-24 NOTE — Progress Notes (Signed)
STROKE SERVICE  PROGRESS NOTE   SUBJECTIVE:                                                                                                                        Offers no new neurological complains. MRI-DWI showed scattered small acute to subacute infarcts in both cerebral hemispheres, brainstem, and right cerebellum. Brain MRA is unimpressive. He is alert and awake and wife tells me that he is not cognitively impaired at this time, able to comprehend conversations and recognize family members.  Long discussion with wife and daughter about neurological status, atrial fibrillation and stroke risk.anticoagulation risky at present due to low platelets, abnormal LFTs and swallowing ability not optimal.   OBJECTIVE:                                                                                                                           Vital signs in last 24 hours: Temp:  [97.1 F (36.2 C)-98 F (36.7 C)] 98 F (36.7 C) (03/01 1110) Pulse Rate:  [96-103] 103 (03/01 0351) Resp:  [17-20] 20 (03/01 0351) BP: (90-96)/(52-57) 95/56 mmHg (03/01 0723) SpO2:  [95 %-99 %] 98 % (03/01 0351)  Intake/Output from previous day: 02/28 0701 - 03/01 0700 In: 3498.3 [I.V.:3498.3] Out: 2665 [Urine:1775; Drains:840; Stool:50] Intake/Output this shift: Total I/O In: 110 [I.V.:110] Out: 740 [Urine:500; Drains:240] Nutritional status: Dysphagia  Past Medical History  Diagnosis Date  . Chronic diarrhea   . Rectal bleed   . Hemorrhoids   . Ulcerative colitis     Distal UC over 8 yrs ago diagnosed  . Diverticulitis of large intestine with perforation 10/2011    done at [place]    Neurologic ROS negative with exception of above.   Neurologic Exam:  Mental status: alert, awake, oriented to person and situation. Follows complex commands. No dysarthria or dysphasia. Cranial Nerves: II: Discs flat bilaterally; Visual fields grossly normal, pupils equal, round,  reactive to light and accommodation III,IV, VI: ptosis not present, extra-ocular motions intact bilaterally V,VII: smile symmetric, facial light touch sensation normal bilaterally VIII: hearing normal bilaterally IX,X: gag reflex present XI: bilateral shoulder shrug XII: midline tongue extension Motor: He is weak all over, right 2/5 greater than left side 4/5.Marland Kitchen Muscle  tone is diminished upper extremities. Sensory: Pinprick and light touch intact throughout, bilaterally Deep Tendon Reflexes: 1+ and symmetric throughout Plantars: Right: downgoing   Left: downgoing Cerebellar: No tested. Gait: no tested. CV: pulses palpable throughout     Lab Results: Lab Results  Component Value Date/Time   CHOL 74 03/05/2012  4:30 AM   Lipid Panel No results found for this basename: CHOL, TRIG, HDL, CHOLHDL, VLDL, LDLCALC,  in the last 72 hours  Studies/Results: Mr [name] Wo Contrast  03/22/2012  *RADIOLOGY REPORT*  Clinical Data:  52 year old male with altered mental status. Decreased right side movement.  Comparison: Head CTs 03/11/2012 and earlier.  MRI HEAD WITHOUT CONTRAST  Technique: Multiplanar, multiecho pulse sequences of the brain and surrounding structures were obtained according to standard protocol without intravenous contrast.  Findings: There are at least 14 small foci of restricted diffusion in the brain.  Seven of these are in the left hemisphere, mostly the left MCA / PCA watershed areas.  The remaining foci are identified in the right occipital and temporal lobes, right brainstem, and right cerebellar hemisphere.  Associated T2 and FLAIR hyperintensity at these areas without mass effect. No acute intracranial hemorrhage identified.  Major intracranial vascular flow voids are preserved with dominant distal left vertebral artery.  MRA findings below.  No ventriculomegaly.  No intracranial mass effect or midline shift. Negative pituitary, cervicomedullary junction and visualized cervical  spine.  Outside of the acute findings described above, there is only mild nonspecific cerebral white matter T2 and FLAIR hyperintensity.  Pulsation artifacts suspected in the cerebellum on the T1-weighted images.  Heterogeneous bone marrow signal without discrete destructive osseous lesion such as due to chronic disease or anemia.  Small mastoid effusions.  Small volume retained secretions in the nasopharynx.  Negative paranasal sinuses. Visualized orbit soft tissues are within normal limits.   Negative scalp soft tissues.  IMPRESSION: 1.  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  Favor sequelae of emboli from a cardiac or proximal aortic source.  Main alternate consideration is hypotensive episode. 2.  No mass effect or hemorrhage. 3.  MRA findings are below. 4.  Mastoid effusions and small volume retained secretions in the pharynx.  MRA HEAD WITHOUT CONTRAST  Technique: Angiographic images of the Circle of Willis were obtained using MRA technique without  intravenous contrast.  Findings: Study is moderately degraded by motion artifact despite repeated imaging attempts.  Antegrade flow in the dominant distal left vertebral artery.  There is antegrade flow detected in the nondominant distal right vertebral artery which probably functionally terminates in PICA. The left vertebral artery supplies the basilar which is patent.  No definite basilar stenosis.  Left AICA, bilateral SCA and bilateral PCA origins are patent.  Right larger than left posterior communicating arteries are evident.  Bilateral PCA branches are within normal limits.  Antegrade flow in both ICA siphons. No ICA stenosis identified. Ophthalmic artery origins and posterior communicating artery origins are within normal limits.  Carotid termini are patent.  MCA and ACA origins are within normal limits.  Diminutive anterior communicating artery.  Proximal ACA and MCA branches are patent.  Distal branches are not well evaluated.  IMPRESSION:  No major circle of Willis branch occlusion or proximal intracranial stenosis identified.  Degraded by motion despite repeated imaging attempts.   Original Report Authenticated By: Roselyn Reef, M.D.    Mr Brain Wo Contrast  03/22/2012  *RADIOLOGY REPORT*  Clinical Data:  52 year old male with altered mental status. Decreased right side movement.  Comparison: Head CTs 03/11/2012 and earlier.  MRI HEAD WITHOUT CONTRAST  Technique: Multiplanar, multiecho pulse sequences of the brain and surrounding structures were obtained according to standard protocol without intravenous contrast.  Findings: There are at least 14 small foci of restricted diffusion in the brain.  Seven of these are in the left hemisphere, mostly the left MCA / PCA watershed areas.  The remaining foci are identified in the right occipital and temporal lobes, right brainstem, and right cerebellar hemisphere.  Associated T2 and FLAIR hyperintensity at these areas without mass effect. No acute intracranial hemorrhage identified.  Major intracranial vascular flow voids are preserved with dominant distal left vertebral artery.  MRA findings below.  No ventriculomegaly.  No intracranial mass effect or midline shift. Negative pituitary, cervicomedullary junction and visualized cervical spine.  Outside of the acute findings described above, there is only mild nonspecific cerebral white matter T2 and FLAIR hyperintensity.  Pulsation artifacts suspected in the cerebellum on the T1-weighted images.  Heterogeneous bone marrow signal without discrete destructive osseous lesion such as due to chronic disease or anemia.  Small mastoid effusions.  Small volume retained secretions in the nasopharynx.  Negative paranasal sinuses. Visualized orbit soft tissues are within normal limits.   Negative scalp soft tissues.  IMPRESSION: 1.  Scattered small acute to subacute infarcts in both cerebral hemispheres, brainstem,  and right cerebellum.  Favor sequelae of emboli from a cardiac or proximal aortic source.  Main alternate consideration is hypotensive episode. 2.  No mass effect or hemorrhage. 3.  MRA findings are below. 4.  Mastoid effusions and small volume retained secretions in the pharynx.  MRA HEAD WITHOUT CONTRAST  Technique: Angiographic images of the Circle of Willis were obtained using MRA technique without  intravenous contrast.  Findings: Study is moderately degraded by motion artifact despite repeated imaging attempts.  Antegrade flow in the dominant distal left vertebral artery.  There is antegrade flow detected in the nondominant distal right vertebral artery which probably functionally terminates in PICA. The left vertebral artery supplies the basilar which is patent.  No definite basilar stenosis.  Left AICA, bilateral SCA and bilateral PCA origins are patent.  Right larger than left posterior communicating arteries are evident.  Bilateral PCA branches are within normal limits.  Antegrade flow in both ICA siphons. No ICA stenosis identified. Ophthalmic artery origins and posterior communicating artery origins are within normal limits.  Carotid termini are patent.  MCA and ACA origins are within normal limits.  Diminutive anterior communicating artery.  Proximal ACA and MCA branches are patent. Distal branches are not well evaluated.  IMPRESSION:  No major circle of Willis branch occlusion or proximal intracranial stenosis identified.  Degraded by motion despite repeated imaging attempts.   Original Report Authenticated By: Roselyn Reef, M.D.    Mr Mrcp  03/23/2012  *RADIOLOGY REPORT*  Clinical Data:  Evaluate for bile duct obstruction.  MRI ABDOMEN WITHOUT CONTRAST (INCLUDING MRCP)  Technique:  Multiplanar multisequence MR imaging of the abdomen was performed both without the administration of intravenous contrast. Heavily T2-weighted images of the biliary and pancreatic ducts were obtained, and  three-dimensional MRCP images were rendered by post processing.  Comparison:  03/11/2012  Findings:  Moderate sized bilateral pleural effusions identified.  There are no focal liver abnormalities identified.  Gallbladder wall appears edematous measuring up to 6.5 mm in thickness.  No stone identified within the lumen of the gallbladder.  There is  no significant intrahepatic biliary ductal dilatation.  The common bile duct has a normal caliber.  No pancreatic duct dilatation. No pancreatic pseudocyst identified.   There is a large subcapsular splenic fluid collection identified. This is predominately T2 hyperintense.  On today's study this measures approximately 13.4 x 9.7 x 10 cm.  Debris and septations are identified within this fluid collection.  The adrenal glands appear normal.  The right kidney is normal. There is a cyst noted within the upper pole the left kidney measuring 1.5 cm.  Several smaller T2 hyperintense structures are noted within the inferior pole the left kidney.  Diffuse edema is not noted involving the gastric wall. There is a mild amount of free fluid within the soft tissues surrounding the stomach and within the perihepatic space.  No upper abdominal adenopathy noted. Fluid signal intensity is identified within the soft tissues dorsal to the spine. This likely represents the dependent soft tissue edema.  IMPRESSION:  1.  No intrahepatic bile duct dilatation or common bile duct dilatation. 2.  The gallbladder wall edema. No gallstones identified.  If there is a concern for cholecystitis then nuclear medicine hepatic biliary scan may be helpful to assess the patency of the cystic duct. 3.  Large, predominately T2 hyperintense subcapsular fluid collection encases the spleen.  This is of indeterminate etiology. This may represent a subacute to chronic subcapsular hematoma or abscess. 4.  Moderate bilateral pleural effusions.  5.  Diffuse edema of the gastric wall.   Original Report Authenticated  By: Kerby Moors, M.D.    Dg Chest Port 1 View  03/24/2012  *RADIOLOGY REPORT*  Clinical Data: Follow-up pleural effusions.  PORTABLE CHEST - 1 VIEW  Comparison: 03/19/2012.  Findings: 0601 hours.  The right IJ central venous catheter and nasogastric tube have been removed.  The left IJ central venous catheter appears unchanged.  There is increased opacity inferiorly in the left hemithorax consistent with an enlarging left pleural effusion and associated basilar air space disease/atelectasis. There is also a small right pleural effusion with increased right basilar pulmonary opacity.  No pneumothorax is identified.  IMPRESSION: Worsening left greater than right pleural effusions and associated bibasilar air space opacities.  The opacities could reflect atelectasis or pneumonia.   Original Report Authenticated By: Richardean Sale, M.D.     MEDICATIONS                                                                                                                       I have reviewed the patient's current medications.  ASSESSMENT/PLAN:  Acute and subacute infarcts with a pattern consistent with cerebral embolism, most likely arising from a cardiac source (atrial fib recently but resolved, septic cardiomyopathy). MRI cervical spine was recommended but not completed (very weak upper extremities). He is thrombocytopenic and agree that he is not a candidate for anticoagulation. Start aspirin for now and anticoagulation when able to swallow safely after thrombocytopenia resolves and LFTs normalize D/w wife, daughter and Dr Marlene Lard and answered questions  03/24/2012, 12:46 PM

## 2012-03-25 ENCOUNTER — Inpatient Hospital Stay (HOSPITAL_COMMUNITY): Payer: BC Managed Care – PPO

## 2012-03-25 LAB — CBC
HCT: 31.1 % — ABNORMAL LOW (ref 39.0–52.0)
MCHC: 30.5 g/dL (ref 30.0–36.0)
Platelets: 78 10*3/uL — ABNORMAL LOW (ref 150–400)
RDW: 21.1 % — ABNORMAL HIGH (ref 11.5–15.5)
WBC: 11.4 10*3/uL — ABNORMAL HIGH (ref 4.0–10.5)

## 2012-03-25 LAB — COMPREHENSIVE METABOLIC PANEL
Alkaline Phosphatase: 921 U/L — ABNORMAL HIGH (ref 39–117)
BUN: 97 mg/dL — ABNORMAL HIGH (ref 6–23)
CO2: 18 mEq/L — ABNORMAL LOW (ref 19–32)
Chloride: 115 mEq/L — ABNORMAL HIGH (ref 96–112)
Creatinine, Ser: 2.16 mg/dL — ABNORMAL HIGH (ref 0.50–1.35)
GFR calc Af Amer: 38 mL/min — ABNORMAL LOW (ref >90)
GFR calc non Af Amer: 33 mL/min — ABNORMAL LOW (ref >90)
Glucose, Bld: 114 mg/dL — ABNORMAL HIGH (ref 70–99)
Potassium: 4.9 mEq/L (ref 3.5–5.1)
Total Bilirubin: 17.4 mg/dL — ABNORMAL HIGH (ref 0.3–1.2)

## 2012-03-25 LAB — URINALYSIS, ROUTINE W REFLEX MICROSCOPIC
Glucose, UA: NEGATIVE mg/dL
Ketones, ur: NEGATIVE mg/dL
Ketones, ur: NEGATIVE mg/dL
Nitrite: NEGATIVE
Nitrite: NEGATIVE
Protein, ur: 30 mg/dL — AB
Protein, ur: 30 mg/dL — AB
Urobilinogen, UA: 0.2 mg/dL (ref 0.0–1.0)
Urobilinogen, UA: 0.2 mg/dL (ref 0.0–1.0)

## 2012-03-25 LAB — URINE MICROSCOPIC-ADD ON

## 2012-03-25 LAB — MAGNESIUM: Magnesium: 2.3 mg/dL (ref 1.5–2.5)

## 2012-03-25 LAB — TROPONIN I: Troponin I: <0.3 ng/mL (ref <0.30)

## 2012-03-25 LAB — GLUCOSE, CAPILLARY
Glucose-Capillary: 80 mg/dL (ref 70–99)
Glucose-Capillary: 83 mg/dL (ref 70–99)

## 2012-03-25 LAB — PROCALCITONIN: Procalcitonin: 1.39 ng/mL

## 2012-03-25 MED ORDER — VANCOMYCIN HCL IN DEXTROSE 1-5 GM/200ML-% IV SOLN
1000.0000 mg | INTRAVENOUS | Status: DC
  Administered 2012-03-26 – 2012-03-27 (×2): 1000 mg via INTRAVENOUS
  Filled 2012-03-25 (×3): qty 200

## 2012-03-25 MED ORDER — DEXTROSE 5 % IV SOLN
1.0000 g | INTRAVENOUS | Status: AC
  Administered 2012-03-25: 1 g via INTRAVENOUS
  Filled 2012-03-25: qty 1

## 2012-03-25 MED ORDER — HYDROCORTISONE SOD SUCCINATE 100 MG IJ SOLR: 50.0000 mg | Freq: Two times a day (BID) | INTRAMUSCULAR | Status: DC

## 2012-03-25 MED ORDER — DEXTROSE 5 % IV SOLN
1.0000 g | Freq: Three times a day (TID) | INTRAVENOUS | Status: DC
  Administered 2012-03-26: 1 g via INTRAVENOUS
  Filled 2012-03-25 (×3): qty 1

## 2012-03-25 MED ORDER — ALBUMIN HUMAN 25 % IV SOLN
12.5000 g | Freq: Once | INTRAVENOUS | Status: AC
  Administered 2012-03-25: 12.5 g via INTRAVENOUS
  Filled 2012-03-25: qty 50

## 2012-03-25 MED ORDER — SODIUM CHLORIDE 0.9 % IV BOLUS (SEPSIS)
500.0000 mL | Freq: Once | INTRAVENOUS | Status: AC
  Administered 2012-03-25: 500 mL via INTRAVENOUS

## 2012-03-25 MED ORDER — METRONIDAZOLE IN NACL 5-0.79 MG/ML-% IV SOLN
500.0000 mg | Freq: Three times a day (TID) | INTRAVENOUS | Status: DC
  Administered 2012-03-25 – 2012-03-28 (×8): 500 mg via INTRAVENOUS
  Filled 2012-03-25 (×12): qty 100

## 2012-03-25 MED ORDER — VANCOMYCIN HCL IN DEXTROSE 1-5 GM/200ML-% IV SOLN
1000.0000 mg | INTRAVENOUS | Status: AC
  Administered 2012-03-25: 1000 mg via INTRAVENOUS
  Filled 2012-03-25: qty 200

## 2012-03-25 MED ORDER — ALBUMIN HUMAN 25 % IV SOLN
25.0000 g | Freq: Four times a day (QID) | INTRAVENOUS | Status: AC
  Administered 2012-03-25 – 2012-03-26 (×4): 25 g via INTRAVENOUS
  Filled 2012-03-25 (×5): qty 100

## 2012-03-25 MED ORDER — HYDROCORTISONE SOD SUCCINATE 100 MG IJ SOLR
50.0000 mg | Freq: Three times a day (TID) | INTRAMUSCULAR | Status: DC
  Administered 2012-03-25 – 2012-03-26 (×3): 50 mg via INTRAVENOUS
  Filled 2012-03-25 (×4): qty 1

## 2012-03-25 MED ORDER — INSULIN ASPART 100 UNIT/ML ~~LOC~~ SOLN
0.0000 [IU] | SUBCUTANEOUS | Status: DC
  Administered 2012-03-26 (×4): 1 [IU] via SUBCUTANEOUS
  Administered 2012-03-27: 2 [IU] via SUBCUTANEOUS
  Administered 2012-03-28 – 2012-03-29 (×2): 1 [IU] via SUBCUTANEOUS

## 2012-03-25 MED ORDER — NEPRO/CARBSTEADY PO LIQD
1000.0000 mL | ORAL | Status: DC
  Administered 2012-03-25: 1000 mL
  Filled 2012-03-25 (×2): qty 1000

## 2012-03-25 NOTE — Progress Notes (Signed)
5 Days Post-Op  Subjective: Sleeping soundly, did not rest well last night  Objective: Vital signs in last 24 hours: Temp:  [97.5 F (36.4 C)-98.4 F (36.9 C)] 98.4 F (36.9 C) (03/02 0730) Pulse Rate:  [49-111] 95 (03/02 1100) Resp:  [11-23] 11 (03/02 1100) BP: (73-99)/(43-64) 99/64 mmHg (03/02 1100) SpO2:  [94 %-100 %] 99 % (03/02 0930) Weight:  [178 lb 12.7 oz (81.1 kg)] 178 lb 12.7 oz (81.1 kg) (03/02 0635) Last BM Date: 03/22/12  Intake/Output from previous day: 03/01 0701 - 03/02 0700 In: 1725 [P.O.:15; I.V.:1210; IV Piggyback:500] Out: 3090 [Urine:1625; Drains:1440; Stool:25] Intake/Output this shift: Total I/O In: 160 [I.V.:160] Out: 320 [Urine:150; Drains:170]  Thick pasty ileostomy output Clear, straw-colored drain output VAC with good seal No apparent abdominal pain  Lab Results: No new results today.  Recent Labs  03/23/12 0445 03/24/12 0355  WBC 12.7* 12.4*  HGB 9.5* 9.8*  HCT 30.9* 31.6*  PLT 50* 51*   BMET  Recent Labs  03/23/12 0445 03/24/12 0355  NA 142 142  K 4.3 4.4  CL 113* 112  CO2 20 20  GLUCOSE 73 65*  BUN 96* 93*  CREATININE 1.97* 2.00*  CALCIUM 7.7* 7.7*   PT/INR  Recent Labs  03/23/12 0445  LABPROT 19.8*  INR 1.75*   ABG No results found for this basename: PHART, PCO2, PO2, HCO3,  in the last 72 hours  Studies/Results: Dg Chest Port 1 View  03/24/2012  *RADIOLOGY REPORT*  Clinical Data: Follow-up pleural effusions.  PORTABLE CHEST - 1 VIEW  Comparison: 03/19/2012.  Findings: 0601 hours.  The right IJ central venous catheter and nasogastric tube have been removed.  The left IJ central venous catheter appears unchanged.  There is increased opacity inferiorly in the left hemithorax consistent with an enlarging left pleural effusion and associated basilar air space disease/atelectasis. There is also a small right pleural effusion with increased right basilar pulmonary opacity.  No pneumothorax is identified.  IMPRESSION:  Worsening left greater than right pleural effusions and associated bibasilar air space opacities.  The opacities could reflect atelectasis or pneumonia.   Original Report Authenticated By: Richardean Sale, M.D.     Anti-infectives: Anti-infectives   Start     Dose/Rate Route Frequency Ordered Stop   03/20/12 1145  ciprofloxacin (CIPRO) IVPB 400 mg    Comments:  ON CALL TO OR   400 mg 200 mL/hr over 60 Minutes Intravenous  Once 03/20/12 1143 03/20/12 1722   03/18/12 1000  vancomycin (VANCOCIN) 1,500 mg in sodium chloride 0.9 % 500 mL IVPB  Status:  Discontinued     1,500 mg 250 mL/hr over 120 Minutes Intravenous Every 48 hours 03/17/12 1920 03/19/12 0840   03/14/12 1000  micafungin (MYCAMINE) 100 mg in sodium chloride 0.9 % 100 mL IVPB  Status:  Discontinued     100 mg 100 mL/hr over 1 Hours Intravenous Daily 03/13/12 0632 03/19/12 0840   03/13/12 1000  micafungin (MYCAMINE) 100 mg in sodium chloride 0.9 % 100 mL IVPB     100 mg 100 mL/hr over 1 Hours Intravenous Daily 03/13/12 0632 03/13/12 1311   03/12/12 1800  vancomycin (VANCOCIN) IVPB 1000 mg/200 mL premix  Status:  Discontinued     1,000 mg 200 mL/hr over 60 Minutes Intravenous Every 24 hours 03/12/12 0859 03/17/12 1017   03/12/12 1000  imipenem-cilastatin (PRIMAXIN) 500 mg in sodium chloride 0.9 % 100 mL IVPB  Status:  Discontinued     500 mg 200 mL/hr over  30 Minutes Intravenous 3 times per day 03/12/12 0848 03/12/12 0909   03/12/12 1000  imipenem-cilastatin (PRIMAXIN) 250 mg in sodium chloride 0.9 % 100 mL IVPB  Status:  Discontinued     250 mg 200 mL/hr over 30 Minutes Intravenous 4 times per day 03/12/12 0909 03/19/12 0841   03/11/12 1200  vancomycin (VANCOCIN) 750 mg in sodium chloride 0.9 % 150 mL IVPB  Status:  Discontinued     750 mg 150 mL/hr over 60 Minutes Intravenous Every 12 hours 03/11/12 0204 03/12/12 0858   03/11/12 0215  vancomycin (VANCOCIN) 750 mg in sodium chloride 0.9 % 150 mL IVPB     750 mg 150 mL/hr over  60 Minutes Intravenous  Once 03/11/12 0204 03/11/12 0352   03/11/12 0215  aztreonam (AZACTAM) 1 g in dextrose 5 % 50 mL IVPB  Status:  Discontinued     1 g 100 mL/hr over 30 Minutes Intravenous 3 times per day 03/11/12 0204 03/12/12 0851   03/05/12 2200  metroNIDAZOLE (FLAGYL) IVPB 500 mg  Status:  Discontinued    Comments:  First dose ASAP   500 mg 100 mL/hr over 60 Minutes Intravenous Every 8 hours 03/05/12 1346 03/19/12 1509   03/02/12 1200  vancomycin (VANCOCIN) 50 mg/mL oral solution 500 mg  Status:  Discontinued     500 mg Oral 4 times per day 03/02/12 0913 03/19/12 1509   03/01/12 1445  vancomycin (VANCOCIN) 50 mg/mL oral solution 500 mg  Status:  Discontinued     500 mg Oral 3 times per day 03/01/12 1340 03/02/12 0913   03/01/12 1400  aztreonam (AZACTAM) 1 g in dextrose 5 % 50 mL IVPB  Status:  Discontinued     1 g 100 mL/hr over 30 Minutes Intravenous 3 times per day 03/01/12 1356 03/02/12 1146   03/01/12 1300  vancomycin (VANCOCIN) 1,250 mg in sodium chloride 0.9 % 250 mL IVPB  Status:  Discontinued     1,250 mg 166.7 mL/hr over 90 Minutes Intravenous Every 12 hours 03/01/12 1127 03/02/12 1146   02/29/12 0600  ciprofloxacin (CIPRO) IVPB 400 mg     400 mg 200 mL/hr over 60 Minutes Intravenous On call to O.R. 02/28/12 1409 02/28/12 1438   02/28/12 2200  vancomycin (VANCOCIN) 500 mg in sodium chloride irrigation 0.9 % 60 mL ENEMA  Status:  Discontinued     500 mg Rectal 2 times daily 02/28/12 1504 02/28/12 1817   02/28/12 1200  vancomycin (VANCOCIN) 50 mg/mL oral solution 500 mg  Status:  Discontinued    Comments:  First dose ASAP   500 mg Per Tube 4 times per day 02/28/12 0814 02/28/12 1817   02/28/12 1000  vancomycin (VANCOCIN) IVPB 1000 mg/200 mL premix  Status:  Discontinued     1,000 mg 200 mL/hr over 60 Minutes Intravenous Every 12 hours 02/28/12 0845 03/01/12 1127   02/28/12 1000  vancomycin (VANCOCIN) 500 mg in sodium chloride irrigation 0.9 % 100 mL ENEMA  Status:   Discontinued     500 mg Rectal 2 times daily 02/28/12 0923 02/28/12 1504   02/27/12 1600  metroNIDAZOLE (FLAGYL) IVPB 500 mg  Status:  Discontinued     500 mg 100 mL/hr over 60 Minutes Intravenous Every 8 hours 02/27/12 1546 02/27/12 2000   02/27/12 1600  ciprofloxacin (CIPRO) IVPB 400 mg  Status:  Discontinued     400 mg 200 mL/hr over 60 Minutes Intravenous Every 12 hours 02/27/12 1547 02/27/12 2000   02/27/12 1545  metroNIDAZOLE (  FLAGYL) IVPB 500 mg  Status:  Discontinued     500 mg 100 mL/hr over 60 Minutes Intravenous  Once 02/27/12 1535 02/28/12 0843   02/27/12 1545  vancomycin (VANCOCIN) 500 mg in sodium chloride 0.9 % 100 mL IVPB  Status:  Discontinued     500 mg 100 mL/hr over 60 Minutes Intravenous  Once 02/27/12 1535 02/27/12 1538   02/27/12 1545  ciprofloxacin (CIPRO) IVPB 400 mg  Status:  Discontinued     400 mg 200 mL/hr over 60 Minutes Intravenous  Once 02/27/12 1538 02/28/12 0843   02/27/12 1415  vancomycin (VANCOCIN) 500 mg in sodium chloride 0.9 % 100 mL IVPB     500 mg 100 mL/hr over 60 Minutes Intravenous  Once 02/27/12 1402 02/27/12 1411   02/27/12 1400  vancomycin (VANCOCIN) powder 500 mg  Status:  Discontinued     500 mg Other To Surgery 02/27/12 1359 02/27/12 1402   02/27/12 1200  metroNIDAZOLE (FLAGYL) IVPB 500 mg  Status:  Discontinued    Comments:  First dose ASAP   500 mg 100 mL/hr over 60 Minutes Intravenous Every 6 hours 02/27/12 0953 03/05/12 1346   02/27/12 1200  vancomycin (VANCOCIN) 50 mg/mL oral solution 250 mg  Status:  Discontinued    Comments:  First dose ASAP   250 mg Oral 4 times per day 02/27/12 0953 02/28/12 0814   02/26/12 1700  oseltamivir (TAMIFLU) capsule 75 mg     75 mg Oral  Once 02/26/12 1651 02/26/12 1715      Assessment/Plan: s/p Procedure(s): CYSTOSCOPY WITH RETROGRADE PYELOGRAM/URETERAL STENT PLACEMENT (Bilateral) 1. S/p total colectomy for c diff, ileostomy  2. Hyperbilirubinemia - steotactic hepatopathy, appreciate Dr. Amedeo Plenty  F/U  3. Multiple small infarcts of brain  4. Wound dehiscence  5. PCM  Plan:  1. Continue with wound VAC, change Monday  2. Speech therapy eval  3. PT/OT  4.  Monitor bilirubin - no acute surgical indications I spoke with his family member at the bedside     LOS: 28 days    TSUEI,MATTHEW K. 03/25/2012

## 2012-03-25 NOTE — Progress Notes (Signed)
eLink Physician-Brief Progress Note Patient Name: Connor Small DOB: [birth year] MRN: [id number]  Date of Service  03/25/2012   HPI/Events of Note  Patient with ongoing issues of hypotension.  Had received fluid bolus earlier.  Now MAP of 58.  Has poor oncotic pressure with albumin of 1.6.  Is making some urine.   eICU Interventions  Plan: One time bolus of 25% albumin 50 ml for BP support.   Intervention Category Intermediate Interventions: Hypotension - evaluation and management  DETERDING,ELIZABETH 03/25/2012, 3:35 AM

## 2012-03-25 NOTE — Progress Notes (Signed)
Pt BP 76/49  MAP running mid 50's DR Deterding notified ,  Ordered Albumin will continue to monitor

## 2012-03-25 NOTE — Progress Notes (Signed)
PULMONARY  / CRITICAL CARE MEDICINE  Name: Connor Small MRN: [id number] DOB: 12-11-1960    ADMISSION DATE:  02/26/2012 CONSULTATION DATE:  2/5  REFERRING MD :  Karilyn Cota  CHIEF COMPLAINT:  Acute resp failure   BRIEF PATIENT DESCRIPTION:  4 YOM admitted to [facility] for Cdiff on 2/2. Underwent decompressive cecostomy 2/3, initially was better, then worsened on 2/4 with increased abdominal distension and tenderness, decreased renal function.  He was taken back to the OR on 2/4 for total abdominal colectomy and end ileostomy.  Post op he developed ARDS and septic shock and was being treated for this.  The morning of 2/6 he had new onset of A-fib and transferred to [facility].   Course complicated by severe hypoalbuminemia, elevated LFTs  -cholestatic without biliary obstruction & pre-renal failure. Hypotensive 3/2 am requiring albumin  SIGNIFICANT EVENTS / STUDIES:  2/2 Admit to [facility] with C diff, toxic megacolon 2/3 Decompressive Cecostomy 2/4 Total abdominal colectomy and end ileostomy 2/6 New A-fib and transfer to [facility] health. 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 2/10 CT abd/pelvis: small hematoma likely subcapsular spleen, JPs wnl 2/11 trial TFs  2/11 UE venous dopplers: Findings consistent with superficial vein thrombosis involving the right basilic vein 2/14 LE venous dopplers: No evidence of deep vein thrombosis 2/14 LUE venous doppler: No evidence of deep vein or superficial thrombosis involving the left upper extremity  2/15 neg 4 l bal on lasix drip, lasix d/c  2/16 poor neuro status, CT head: [facility] 2/17 CRRT initiated, high pressor needs 2/17 RUQ [place]: Gallbladder wall thickening in the absence of sludge or stones but no biliary ductal dilatation 2/18 reduced vasopressor reqts 2/19 New onset thrombocytopenia. Heparin D/C'd. Argatroban  initiated 2/19 [place] abd (repeat): No explanation for elevated liver function tests 2/20 reduction pressors, improved alertness, extubated 2/20 LE venous dopplers: No evidence of deep vein or superficial thrombosis 2/21 bleeding overnight from JP's, argatroban turned off, lower pressor needs 2/22 off pressors, on RA, converted to NSR 2/22 Carotid dopplers: No significant extracranial carotid artery stenosis demonstrated 2/25 Cystoscopy with bilateral retrograde pyelograms. No extravasation of contrast from either ureteral orifice. No evidence of hydroureteronephrosis. Unremarkable urinary bladder. B ureteral stents placed 2/27 MRI brain: Scattered small acute to subacute infarcts in both cerebral hemispheres, brainstem, and right cerebellum. Favor sequelae of emboli from a cardiac or proximal aortic source. Main alternate consideration is hypotensive episode 2/27 MRCP: No intrahepatic bile duct dilatation or common bile duct dilatation. No gallstones identified. Large subcapsular fluid collection encases the spleen. This is of indeterminate etiology. This may represent a subacute to chronic subcapsular hematoma or abscess. 2/28 SLP eval: FEES performed. Oropharyngeal weakness noted. Recommend replacement of NGT to supplement PO intake 3/1 Renal signed off  LINES / TUBES: ETT ([facility]) 2/4>>> 2/20 PICC ([facility]) 2/4>>>2/11 Art Line ([facility]) 2/5>>>out L IJ CVL 2/06 >> out A line R fem 2/18>>>2/24 Rt ij HD 2/17 >> out Left IJ 2/18 >>   CULTURES: BCx2 2/4>>>negative BCx2 2/3>>>negative UC 2/3- Negative MRSA PCR 2/3- Negative C diff 2/6>>>Positive Body fluid 2/9>>>WBCs, no organisms 2/15 BC x 2 >> NEG 2/15 Sputum >>>NF  ANTIBIOTICS: Flagyl 2/3>> d/c'd Vanc 2/6 (IV) >>>2/7 azactam 2/6>>>2/7 Oral vanc 2/6>>>off IV vanc 2/16 >2/24 Aztreonam 2/16 >2/17 Imipenem 2/17>>>2/24 mycofungin 2/18>>>2/24  SUBJECTIVE:    No distress. Very weak.  Bp improved with albumin  VITAL  SIGNS: Temp:  [97.5 F  (36.4 C)-98.4 F (36.9 C)] 98.4 F (36.9 C) (03/02 0730) Pulse Rate:  [49-111] 95 (03/02 1100) Resp:  [11-23] 11 (03/02 1100) BP: (73-99)/(43-64) 99/64 mmHg (03/02 1100) SpO2:  [94 %-100 %] 99 % (03/02 0930) Weight:  [81.1 kg (178 lb 12.7 oz)] 81.1 kg (178 lb 12.7 oz) (03/02 0635)    INTAKE / OUTPUT: Intake/Output     03/01 0701 - 03/02 0700 03/02 0701 - 03/03 0700   P.O. 15    I.V. (mL/kg) 1210 (14.9) 160 (2)   IV Piggyback 500    Total Intake(mL/kg) 1725 (21.3) 160 (2)   Urine (mL/kg/hr) 1625 (0.8) 150 (0.4)   Drains 1440 (0.7) 170 (0.5)   Stool 25 (0)    Total Output 3090 320   Net -1365 -160          PHYSICAL EXAMINATION: General Icteric, NAD Neuro: diffusely weak, no focal deficits HEENT: Sclericterus, EOMI, PERRL Cardiovascular: RRR s M Lungs: clear anteriorly, diminished in bases Abdomen: soft, NT, + BS, wound vac in place, abdominal binder Musculoskeletal: 2+ LE edema  Skin: rash improved  LABS: BMET    Component Value Date/Time   NA 142 03/24/2012 0355   K 4.4 03/24/2012 0355   CL 112 03/24/2012 0355   CO2 20 03/24/2012 0355   GLUCOSE 65* 03/24/2012 0355   BUN 93* 03/24/2012 0355   CREATININE 2.00* 03/24/2012 0355   CALCIUM 7.7* 03/24/2012 0355   GFRNONAA 36* 03/24/2012 0355   GFRAA 42* 03/24/2012 0355    Hepatic Function Panel     Component Value Date/Time   PROT 3.4* 03/24/2012 0355   ALBUMIN 1.5* 03/24/2012 0355   ALBUMIN 1.5* 03/24/2012 0355   AST 270* 03/24/2012 0355   ALT 237* 03/24/2012 0355   ALKPHOS 793* 03/24/2012 0355   BILITOT 18.6* 03/24/2012 0355   BILIDIR 14.2* 03/24/2012 0355   IBILI 4.4* 03/24/2012 0355    CBC    Component Value Date/Time   WBC 12.4* 03/24/2012 0355   RBC 3.11* 03/24/2012 0355   HGB 9.8* 03/24/2012 0355   HCT 31.6* 03/24/2012 0355   PLT 51* 03/24/2012 0355   MCV 101.6* 03/24/2012 0355   MCH 31.5 03/24/2012 0355   MCHC 31.0 03/24/2012 0355   RDW 21.4* 03/24/2012 0355   LYMPHSABS 0.4* 03/22/2012 0428   MONOABS 0.3 03/22/2012 0428   EOSABS 0.0 03/22/2012  0428   BASOSABS 0.0 03/22/2012 0428      Recent Labs Lab 03/24/12 0605 03/24/12 0638 03/24/12 1306 03/24/12 2159 03/25/12 0622  GLUCAP 48* 95 72 82 80    Dg Chest Port 1 View  03/24/2012  *RADIOLOGY REPORT*  Clinical Data: Follow-up pleural effusions.  PORTABLE CHEST - 1 VIEW  Comparison: 03/19/2012.  Findings: 0601 hours.  The right IJ central venous catheter and nasogastric tube have been removed.  The left IJ central venous catheter appears unchanged.  There is increased opacity inferiorly in the left hemithorax consistent with an enlarging left pleural effusion and associated basilar air space disease/atelectasis. There is also a small right pleural effusion with increased right basilar pulmonary opacity.  No pneumothorax is identified.  IMPRESSION: Worsening left greater than right pleural effusions and associated bibasilar air space opacities.  The opacities could reflect atelectasis or pneumonia.   Original Report Authenticated By: Carey Bullocks, M.D.      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 Increased drainage from tubes. 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 -Neuro to re-evaluated new MRI findings> see Pearlean Brownie note 3/1 > rx when plt's better as he had prob af> emboli Not a candidate for heparin , Avoiding all anticoagulants if possible due to high risk of bleed     Severe muscle deconditioning   Shoulder subluxation, right - Cont PT/OT discuss R shoulder subluxation with Ortho  GI    Hyperbilirubinemia, due to cholestasis - Monitor hepatic function panel  Dysphagia due to oropharyngeal weakness - Reinsert small bore NG tube & start TFs    Thrombocytopenia, unspecified, HIT panel negative    Coagulopathy - likely due to hepatic dysfunction  Anemia, mild. No evidence of active blood loss. No indication for PRBCs   Small splenic hematoma  Pulm    Pleural effusion, L > R   Renal   ARF (acute renal failure), Cr leveling off -?now CKD   Hypernatremia, improving Correct free water/HCO3 deficit - renal signed off 3/1. Still on bicarb drip for NAG acidosis  Endocrine    Relative adrenal insufficiency - hydrocortisone increase 3-2 for hypotension     Resolved Problems:    history of UC (ulcerative colitis)   Atrial fibrillation, resolved   Acute respiratory failure, resolved   Septic shock, resolved   RUE DVT related to PICC, resolved   Hypokalemia, resolved   Hyperglycemia, resolved   Acute encephalopathy, resolved     Updated wife in detail  Brett Canales Minor ACNP [facility] PCCM Pager [phone] till 3 pm If no answer page [phone]  Care during the described time interval was provided by me and/or other providers on the critical care team.  I have reviewed this patient's available data, including medical history, events of note, physical examination and test results as part of my evaluation  CC time x  54m  ALVA,RAKESH V.  03/25/2012, 11:16 AM

## 2012-03-25 NOTE — Progress Notes (Signed)
5 Days Post-Op  Subjective: Pt c/o abdominal tenderness, but no real pain.  Pt denies N/V.  Having dark green sticky stools in ostomy bag and copious amounts of yellow drainage from 2 drains and wound vac.  Pt had a bad day yesterday with his mental status, but is a bit better today.  SLP doesn't want him to eat PO today.   Objective: Vital signs in last 24 hours: Temp:  [97.5 F (36.4 C)-98.4 F (36.9 C)] 98.4 F (36.9 C) (03/02 0730) Pulse Rate:  [49-111] 101 (03/02 0930) Resp:  [12-23] 20 (03/02 0930) BP: (73-93)/(43-61) 92/45 mmHg (03/02 0930) SpO2:  [94 %-100 %] 99 % (03/02 0930) Weight:  [178 lb 12.7 oz (81.1 kg)] 178 lb 12.7 oz (81.1 kg) (03/02 0635) Last BM Date: 03/22/12  Intake/Output from previous day: 03/01 0701 - 03/02 0700 In: 1725 [P.O.:15; I.V.:1210; IV Piggyback:500] Out: 3090 [Urine:1625; Drains:1440; Stool:25] Intake/Output this shift: Total I/O In: 260 [I.V.:160; Other:100] Out: 320 [Urine:150; Drains:170]  PE: Gen:  Alert, NAD, pleasant Card:  RRR, no M/G/R heard Pulm:  CTA, no W/R/R Abd: Soft, NT/ND, +BS, midline incision with wound vac in place; dark green sticky stool (meconium like) from ostomy bag, ostomy pink without frank blood; 2 abdominal drains and wound vac with yellowish fluid which looks like ascites Skin: lacy erythematous rash on extremities and trunk  Lab Results:   Recent Labs  03/23/12 0445 03/24/12 0355  WBC 12.7* 12.4*  HGB 9.5* 9.8*  HCT 30.9* 31.6*  PLT 50* 51*   BMET  Recent Labs  03/23/12 0445 03/24/12 0355  NA 142 142  K 4.3 4.4  CL 113* 112  CO2 20 20  GLUCOSE 73 65*  BUN 96* 93*  CREATININE 1.97* 2.00*  CALCIUM 7.7* 7.7*   PT/INR  Recent Labs  03/23/12 0445  LABPROT 19.8*  INR 1.75*   CMP     Component Value Date/Time   NA 142 03/24/2012 0355   K 4.4 03/24/2012 0355   CL 112 03/24/2012 0355   CO2 20 03/24/2012 0355   GLUCOSE 65* 03/24/2012 0355   BUN 93* 03/24/2012 0355   CREATININE 2.00* 03/24/2012 0355   CALCIUM 7.7* 03/24/2012 0355   PROT 3.4* 03/24/2012 0355   ALBUMIN 1.5* 03/24/2012 0355   ALBUMIN 1.5* 03/24/2012 0355   AST 270* 03/24/2012 0355   ALT 237* 03/24/2012 0355   ALKPHOS 793* 03/24/2012 0355   BILITOT 18.6* 03/24/2012 0355   GFRNONAA 36* 03/24/2012 0355   GFRAA 42* 03/24/2012 0355   Lipase     Component Value Date/Time   LIPASE 9* 02/26/2012 1409       Studies/Results: Dg Chest Port 1 View  03/24/2012  *RADIOLOGY REPORT*  Clinical Data: Follow-up pleural effusions.  PORTABLE CHEST - 1 VIEW  Comparison: 03/19/2012.  Findings: 0601 hours.  The right IJ central venous catheter and nasogastric tube have been removed.  The left IJ central venous catheter appears unchanged.  There is increased opacity inferiorly in the left hemithorax consistent with an enlarging left pleural effusion and associated basilar air space disease/atelectasis. There is also a small right pleural effusion with increased right basilar pulmonary opacity.  No pneumothorax is identified.  IMPRESSION: Worsening left greater than right pleural effusions and associated bibasilar air space opacities.  The opacities could reflect atelectasis or pneumonia.   Original Report Authenticated By: Richardean Sale, M.D.     Anti-infectives: Anti-infectives   Start     Dose/Rate Route Frequency Ordered Stop  03/20/12 1145  ciprofloxacin (CIPRO) IVPB 400 mg    Comments:  ON CALL TO OR   400 mg 200 mL/hr over 60 Minutes Intravenous  Once 03/20/12 1143 03/20/12 1722   03/18/12 1000  vancomycin (VANCOCIN) 1,500 mg in sodium chloride 0.9 % 500 mL IVPB  Status:  Discontinued     1,500 mg 250 mL/hr over 120 Minutes Intravenous Every 48 hours 03/17/12 1920 03/19/12 0840   03/14/12 1000  micafungin (MYCAMINE) 100 mg in sodium chloride 0.9 % 100 mL IVPB  Status:  Discontinued     100 mg 100 mL/hr over 1 Hours Intravenous Daily 03/13/12 0632 03/19/12 0840   03/13/12 1000  micafungin (MYCAMINE) 100 mg in sodium chloride 0.9 % 100 mL IVPB     100  mg 100 mL/hr over 1 Hours Intravenous Daily 03/13/12 0632 03/13/12 1311   03/12/12 1800  vancomycin (VANCOCIN) IVPB 1000 mg/200 mL premix  Status:  Discontinued     1,000 mg 200 mL/hr over 60 Minutes Intravenous Every 24 hours 03/12/12 0859 03/17/12 1017   03/12/12 1000  imipenem-cilastatin (PRIMAXIN) 500 mg in sodium chloride 0.9 % 100 mL IVPB  Status:  Discontinued     500 mg 200 mL/hr over 30 Minutes Intravenous 3 times per day 03/12/12 0848 03/12/12 0909   03/12/12 1000  imipenem-cilastatin (PRIMAXIN) 250 mg in sodium chloride 0.9 % 100 mL IVPB  Status:  Discontinued     250 mg 200 mL/hr over 30 Minutes Intravenous 4 times per day 03/12/12 0909 03/19/12 0841   03/11/12 1200  vancomycin (VANCOCIN) 750 mg in sodium chloride 0.9 % 150 mL IVPB  Status:  Discontinued     750 mg 150 mL/hr over 60 Minutes Intravenous Every 12 hours 03/11/12 0204 03/12/12 0858   03/11/12 0215  vancomycin (VANCOCIN) 750 mg in sodium chloride 0.9 % 150 mL IVPB     750 mg 150 mL/hr over 60 Minutes Intravenous  Once 03/11/12 0204 03/11/12 0352   03/11/12 0215  aztreonam (AZACTAM) 1 g in dextrose 5 % 50 mL IVPB  Status:  Discontinued     1 g 100 mL/hr over 30 Minutes Intravenous 3 times per day 03/11/12 0204 03/12/12 0851   03/05/12 2200  metroNIDAZOLE (FLAGYL) IVPB 500 mg  Status:  Discontinued    Comments:  First dose ASAP   500 mg 100 mL/hr over 60 Minutes Intravenous Every 8 hours 03/05/12 1346 03/19/12 1509   03/02/12 1200  vancomycin (VANCOCIN) 50 mg/mL oral solution 500 mg  Status:  Discontinued     500 mg Oral 4 times per day 03/02/12 0913 03/19/12 1509   03/01/12 1445  vancomycin (VANCOCIN) 50 mg/mL oral solution 500 mg  Status:  Discontinued     500 mg Oral 3 times per day 03/01/12 1340 03/02/12 0913   03/01/12 1400  aztreonam (AZACTAM) 1 g in dextrose 5 % 50 mL IVPB  Status:  Discontinued     1 g 100 mL/hr over 30 Minutes Intravenous 3 times per day 03/01/12 1356 03/02/12 1146   03/01/12 1300   vancomycin (VANCOCIN) 1,250 mg in sodium chloride 0.9 % 250 mL IVPB  Status:  Discontinued     1,250 mg 166.7 mL/hr over 90 Minutes Intravenous Every 12 hours 03/01/12 1127 03/02/12 1146   02/29/12 0600  ciprofloxacin (CIPRO) IVPB 400 mg     400 mg 200 mL/hr over 60 Minutes Intravenous On call to O.R. 02/28/12 1409 02/28/12 1438   02/28/12 2200  vancomycin (VANCOCIN) 500  mg in sodium chloride irrigation 0.9 % 60 mL ENEMA  Status:  Discontinued     500 mg Rectal 2 times daily 02/28/12 1504 02/28/12 1817   02/28/12 1200  vancomycin (VANCOCIN) 50 mg/mL oral solution 500 mg  Status:  Discontinued    Comments:  First dose ASAP   500 mg Per Tube 4 times per day 02/28/12 0814 02/28/12 1817   02/28/12 1000  vancomycin (VANCOCIN) IVPB 1000 mg/200 mL premix  Status:  Discontinued     1,000 mg 200 mL/hr over 60 Minutes Intravenous Every 12 hours 02/28/12 0845 03/01/12 1127   02/28/12 1000  vancomycin (VANCOCIN) 500 mg in sodium chloride irrigation 0.9 % 100 mL ENEMA  Status:  Discontinued     500 mg Rectal 2 times daily 02/28/12 0923 02/28/12 1504   02/27/12 1600  metroNIDAZOLE (FLAGYL) IVPB 500 mg  Status:  Discontinued     500 mg 100 mL/hr over 60 Minutes Intravenous Every 8 hours 02/27/12 1546 02/27/12 2000   02/27/12 1600  ciprofloxacin (CIPRO) IVPB 400 mg  Status:  Discontinued     400 mg 200 mL/hr over 60 Minutes Intravenous Every 12 hours 02/27/12 1547 02/27/12 2000   02/27/12 1545  metroNIDAZOLE (FLAGYL) IVPB 500 mg  Status:  Discontinued     500 mg 100 mL/hr over 60 Minutes Intravenous  Once 02/27/12 1535 02/28/12 0843   02/27/12 1545  vancomycin (VANCOCIN) 500 mg in sodium chloride 0.9 % 100 mL IVPB  Status:  Discontinued     500 mg 100 mL/hr over 60 Minutes Intravenous  Once 02/27/12 1535 02/27/12 1538   02/27/12 1545  ciprofloxacin (CIPRO) IVPB 400 mg  Status:  Discontinued     400 mg 200 mL/hr over 60 Minutes Intravenous  Once 02/27/12 1538 02/28/12 0843   02/27/12 1415  vancomycin  (VANCOCIN) 500 mg in sodium chloride 0.9 % 100 mL IVPB     500 mg 100 mL/hr over 60 Minutes Intravenous  Once 02/27/12 1402 02/27/12 1411   02/27/12 1400  vancomycin (VANCOCIN) powder 500 mg  Status:  Discontinued     500 mg Other To Surgery 02/27/12 1359 02/27/12 1402   02/27/12 1200  metroNIDAZOLE (FLAGYL) IVPB 500 mg  Status:  Discontinued    Comments:  First dose ASAP   500 mg 100 mL/hr over 60 Minutes Intravenous Every 6 hours 02/27/12 0953 03/05/12 1346   02/27/12 1200  vancomycin (VANCOCIN) 50 mg/mL oral solution 250 mg  Status:  Discontinued    Comments:  First dose ASAP   250 mg Oral 4 times per day 02/27/12 0953 02/28/12 0814   02/26/12 1700  oseltamivir (TAMIFLU) capsule 75 mg     75 mg Oral  Once 02/26/12 1651 02/26/12 1715       Assessment/Plan 1. S/p total colectomy for c diff, ileostomy  2. Hyperbilirubinemia - steotactic hepatopathy, appreciate Dr. Amedeo Plenty F/U  3. Multiple small infarcts of brain  4. Wound dehiscence - with wound vac, large amounts of yellowish fluid from drains and wound vac 5. PCM  6. Hypotension  Plan:  1. Continue with wound VAC, change MWF 2. Speech therapy eval  3. PT/OT  4. Big increase in fluid from drains and wound vac, looks like ascites, may be due to liver issues (went from 847m/24 hours to 14451m24 hours for 3 drains) 5. Pt being transferred to ICU due to hypotension I spoke with his wife at the bedside and answered her questions     LOS: 28 days  Coralie Keens 03/25/2012, 10:56 AM Pager: [phone]

## 2012-03-25 NOTE — Progress Notes (Signed)
Agree with above.  Imogene Burn. Georgette Dover, MD, [facility]  03/25/2012 11:21 AM

## 2012-03-25 NOTE — Progress Notes (Signed)
ANTIBIOTIC CONSULT NOTE - INITIAL  Pharmacy Consult for Vancomycin + Aztreonam Indication: rule out sepsis  Allergies  Allergen Reactions  . Penicillins Other (See Comments)    Heart rate changes    Patient Measurements: Height: 5' 9"  (175.3 cm) Weight: 175 lb 7.8 oz (79.6 kg) IBW/kg (Calculated) : 70.7  Vital Signs: Temp: 98.1 F (36.7 C) (03/02 1300) Temp src: Oral (03/02 1300) BP: 83/50 mmHg (03/02 1900) Pulse Rate: 102 (03/02 1900) Intake/Output from previous day: 03/01 0701 - 03/02 0700 In: 1725 [P.O.:15; I.V.:1210; IV Piggyback:500] Out: 3090 [Urine:1625; Drains:1440; Stool:25] Intake/Output from this shift:    Labs:  Recent Labs  03/23/12 0445 03/24/12 0355 03/25/12 1655  WBC 12.7* 12.4* 11.4*  HGB 9.5* 9.8* 9.5*  PLT 50* 51* 78*  CREATININE 1.97* 2.00* 2.16*   Estimated Creatinine Clearance: 39.6 ml/min (by C-G formula based on Cr of 2.16). No results found for this basename: VANCOTROUGH, Corlis Leak, VANCORANDOM, GENTTROUGH, GENTPEAK, GENTRANDOM, TOBRATROUGH, TOBRAPEAK, TOBRARND, AMIKACINPEAK, AMIKACINTROU, AMIKACIN,  in the last 72 hours   Microbiology: Recent Results (from the past 720 hour(s))  CULTURE, BLOOD (ROUTINE X 2)     Status: None   Collection Time    02/27/12  4:58 AM      Result Value Range Status   Specimen Description BLOOD RIGHT ARM   Final   Special Requests BOTTLES DRAWN AEROBIC AND ANAEROBIC 6CC   Final   Culture NO GROWTH 6 DAYS   Final   Report Status 03/04/2012 FINAL   Final  CULTURE, BLOOD (ROUTINE X 2)     Status: None   Collection Time    02/27/12  5:15 AM      Result Value Range Status   Specimen Description BLOOD RIGHT HAND   Final   Special Requests BOTTLES DRAWN AEROBIC AND ANAEROBIC 6CC   Final   Culture NO GROWTH 6 DAYS   Final   Report Status 03/04/2012 FINAL   Final  SURGICAL PCR SCREEN     Status: None   Collection Time    02/27/12  3:15 PM      Result Value Range Status   MRSA, PCR NEGATIVE  NEGATIVE Final   Staphylococcus aureus NEGATIVE  NEGATIVE Final   Comment:            The Xpert SA Assay (FDA     approved for NASAL specimens     in patients over 47 years of age),     is one component of     a comprehensive surveillance     program.  Test performance has     been validated by [employer] for patients greater     than or equal to 54 year old.     It is not intended     to diagnose infection nor to     guide or monitor treatment.  URINE CULTURE     Status: None   Collection Time    02/28/12 10:03 AM      Result Value Range Status   Specimen Description URINE, CATHETERIZED   Final   Special Requests NONE   Final   Culture  Setup Time 02/28/2012 19:00   Final   Colony Count NO GROWTH   Final   Culture NO GROWTH   Final   Report Status 02/29/2012 FINAL   Final  CULTURE, BLOOD (ROUTINE X 2)     Status: None   Collection Time    02/28/12 12:43 PM  Result Value Range Status   Specimen Description BLOOD LEFT ARM   Final   Special Requests BOTTLES DRAWN AEROBIC AND ANAEROBIC 8CC   Final   Culture NO GROWTH 5 DAYS   Final   Report Status 03/04/2012 FINAL   Final  CULTURE, BLOOD (ROUTINE X 2)     Status: None   Collection Time    02/28/12  1:39 PM      Result Value Range Status   Specimen Description BLOOD LEFT ANTECUBITAL   Final   Special Requests BOTTLES DRAWN AEROBIC AND ANAEROBIC Jarrell   Final   Culture NO GROWTH 5 DAYS   Final   Report Status 03/04/2012 FINAL   Final  MRSA PCR SCREENING     Status: None   Collection Time    03/01/12  2:51 PM      Result Value Range Status   MRSA by PCR NEGATIVE  NEGATIVE Final   Comment:            The GeneXpert MRSA Assay (FDA     approved for NASAL specimens     only), is one component of a     comprehensive MRSA colonization     surveillance program. It is not     intended to diagnose MRSA     infection nor to guide or     monitor treatment for     MRSA infections.  CLOSTRIDIUM DIFFICILE BY PCR     Status: Abnormal    Collection Time    03/01/12  3:38 PM      Result Value Range Status   C difficile by pcr POSITIVE (*) NEGATIVE Final   Comment: CRITICAL RESULT CALLED TO, READ BACK BY AND VERIFIED WITH:     WHITEK RN 17:05 03/01/12 ([email or web address])  BODY FLUID CULTURE     Status: None   Collection Time    03/04/12  3:34 PM      Result Value Range Status   Specimen Description PLEURAL FLUID   Final   Special Requests NONE   Final   Gram Stain     Final   Value: CYTOSPIN SLIDE WBC PRESENT,BOTH PMN AND MONONUCLEAR     NO ORGANISMS SEEN   Culture NO GROWTH 3 DAYS   Final   Report Status 03/09/2012 FINAL   Final  CULTURE, BLOOD (ROUTINE X 2)     Status: None   Collection Time    03/09/12 12:25 PM      Result Value Range Status   Specimen Description BLOOD LEFT HAND   Final   Special Requests BOTTLES DRAWN AEROBIC ONLY 2CC   Final   Culture  Setup Time 03/09/2012 18:19   Final   Culture NO GROWTH 5 DAYS   Final   Report Status 03/15/2012 FINAL   Final  CULTURE, BLOOD (ROUTINE X 2)     Status: None   Collection Time    03/10/12  5:10 PM      Result Value Range Status   Specimen Description BLOOD RIGHT ANTECUBITAL   Final   Special Requests BOTTLES DRAWN AEROBIC ONLY 5CC   Final   Culture  Setup Time 03/10/2012 21:33   Final   Culture NO GROWTH 5 DAYS   Final   Report Status 03/16/2012 FINAL   Final  CULTURE, RESPIRATORY (NON-EXPECTORATED)     Status: None   Collection Time    03/10/12  5:16 PM      Result Value Range Status   Specimen Description TRACHEAL  ASPIRATE   Final   Special Requests Normal   Final   Gram Stain     Final   Value: ABUNDANT WBC PRESENT, PREDOMINANTLY PMN     RARE GRAM POSITIVE COCCI IN PAIRS   Culture Non-Pathogenic Oropharyngeal-type Flora Isolated.   Final   Report Status 03/13/2012 FINAL   Final    Medical History: Past Medical History  Diagnosis Date  . Chronic diarrhea   . Rectal bleed   . Hemorrhoids   . Ulcerative colitis     Distal UC over 8 yrs ago diagnosed  .  Diverticulitis of large intestine with perforation 10/2011    done at [place]: 52 y.o. M to start Vancomycin + Aztreonam for r/o sepsis. Repeat cultures (blood/urine/sputum) have been ordered. Afebrile, WBC 11.4, SCr 2.16, CrCl~40 ml/min.   Goal of Therapy:  Vancomycin trough level 15-20 mcg/ml  Plan:  1. Vancomycin 1g IV every 24 hours 2. Aztreonam 1g IV every 8 hours 3. Will continue to follow renal function, culture results, LOT, and antibiotic de-escalation plans   Alycia Rossetti, PharmD, BCPS Clinical Pharmacist Pager: [phone] 03/25/2012 7:32 PM

## 2012-03-25 NOTE — Progress Notes (Signed)
Pt BP 58'Q/63 systolic,  MAP 86-85, pt feeling "tired" Dr Conception Chancy notified new received will continue to monitor

## 2012-03-25 NOTE — Progress Notes (Signed)
Report called to [facility], receiving RN on  2100.  Pt transferring d/t requiring higher level of care. Transferred to 2107 via bed with personal belongings. Family at bedside.  [facility]

## 2012-03-25 NOTE — Progress Notes (Signed)
Hypotension  Start Antibiotics; CVP monitoring;

## 2012-03-25 NOTE — Progress Notes (Signed)
Hypotension   NS ordered

## 2012-03-25 NOTE — Progress Notes (Signed)
Hyperglycemia   SSI ordered

## 2012-03-25 NOTE — Progress Notes (Signed)
STROKE SERVICE  PROGRESS NOTE   SUBJECTIVE:                                                                                                                        Offers no new neurological complains. MRI-DWI showed scattered small acute to subacute infarcts in both cerebral hemispheres, brainstem, and right cerebellum. Brain MRA is unimpressive. He is alert and awake and wife tells me that he is not cognitively impaired at this time, able to comprehend conversations and recognize family members.  Long discussion with wife and daughter on 03/24/12 about neurological status, atrial fibrillation and stroke risk.anticoagulation risky at present due to low platelets, abnormal LFTs and swallowing ability not optimal.   OBJECTIVE:      Had hypotension y`day .given albumin       Platelets remain low but stable at 51,000                                                                                                              Vital signs in last 24 hours: Temp:  [97.5 F (36.4 C)-98.4 F (36.9 C)] 98.4 F (36.9 C) (03/02 0730) Pulse Rate:  [49-111] 101 (03/02 0930) Resp:  [12-23] 20 (03/02 0930) BP: (73-93)/(43-61) 92/45 mmHg (03/02 0930) SpO2:  [94 %-100 %] 99 % (03/02 0930) Weight:  [81.1 kg (178 lb 12.7 oz)] 81.1 kg (178 lb 12.7 oz) (03/02 0635)  Intake/Output from previous day: 03/01 0701 - 03/02 0700 In: 1725 [P.O.:15; I.V.:1210; IV Piggyback:500] Out: 3090 [Urine:1625; Drains:1440; Stool:25] Intake/Output this shift: Total I/O In: 100 [I.V.:100] Out: 220 [Urine:150; Drains:70] Nutritional status: Dysphagia  Past Medical History  Diagnosis Date  . Chronic diarrhea   . Rectal bleed   . Hemorrhoids   . Ulcerative colitis     Distal UC over 8 yrs ago diagnosed  . Diverticulitis of large intestine with perforation 10/2011    done at [place]    Neurologic ROS negative with exception of above.   Neurologic Exam:  Mental status: alert,  awake, oriented to person and situation. Follows complex commands. No dysarthria or dysphasia. Cranial Nerves: II: Discs flat bilaterally; Visual fields grossly normal, pupils equal, round, reactive to light and accommodation III,IV, VI: ptosis not present, extra-ocular motions intact bilaterally V,VII: smile symmetric, facial light touch sensation normal  bilaterally VIII: hearing normal bilaterally IX,X: gag reflex present XI: bilateral shoulder shrug XII: midline tongue extension Neck flexors and extensors weak Motor: He is weak all over, right 2/5 greater than left side 4/5.Marland Kitchen Muscle tone is diminished upper extremities. And has neck weakness as well. Sensory: Pinprick and light touch intact throughout, bilaterally Deep Tendon Reflexes: 1+ and symmetric throughout Plantars: Right: downgoing   Left: downgoing Cerebellar: No tested. Gait: no tested. CV: pulses palpable throughout     Lab Results: Lab Results  Component Value Date/Time   CHOL 74 03/05/2012  4:30 AM   Lipid Panel No results found for this basename: CHOL, TRIG, HDL, CHOLHDL, VLDL, LDLCALC,  in the last 72 hours  Studies/Results: Dg Chest Port 1 View  03/24/2012  *RADIOLOGY REPORT*  Clinical Data: Follow-up pleural effusions.  PORTABLE CHEST - 1 VIEW  Comparison: 03/19/2012.  Findings: 0601 hours.  The right IJ central venous catheter and nasogastric tube have been removed.  The left IJ central venous catheter appears unchanged.  There is increased opacity inferiorly in the left hemithorax consistent with an enlarging left pleural effusion and associated basilar air space disease/atelectasis. There is also a small right pleural effusion with increased right basilar pulmonary opacity.  No pneumothorax is identified.  IMPRESSION: Worsening left greater than right pleural effusions and associated bibasilar air space opacities.  The opacities could reflect atelectasis or pneumonia.   Original Report Authenticated By: Richardean Sale, M.D.     MEDICATIONS                                                                                                                       I have reviewed the patient's current medications.  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 MRI cervical spine was recommended but not completed (very weak upper extremities).I spoke to wife about this and at present he is too sick to go for another MRI and findings are unlikely to impact current treatment and we may visit this later. He is thrombocytopenic and agree that he is not a candidate for anticoagulation. Consider aspirin 81 mg when platelets improve. Start aspirin for now and anticoagulation when able to swallow safely after thrombocytopenia resolves and LFTs normalize D/w wife, daughter and Dr Marlene Lard and answered  questions  Stroke service will sign off now but call us back when medical condition improves and he is ready for anticoagulation. 03/25/2012, 9:37 AM

## 2012-03-26 ENCOUNTER — Inpatient Hospital Stay (HOSPITAL_COMMUNITY): Payer: BC Managed Care – PPO

## 2012-03-26 ENCOUNTER — Inpatient Hospital Stay (HOSPITAL_COMMUNITY): Payer: BC Managed Care – PPO | Admitting: Anesthesiology

## 2012-03-26 ENCOUNTER — Encounter (HOSPITAL_COMMUNITY): Payer: Self-pay | Admitting: Anesthesiology

## 2012-03-26 ENCOUNTER — Encounter (HOSPITAL_COMMUNITY)
Admission: EM | Disposition: A | Discharge status: Rehabilitation Facility | Payer: Self-pay | Source: Home / Self Care | Attending: Pulmonary Disease

## 2012-03-26 DIAGNOSIS — K838 Other specified diseases of biliary tract: Secondary | ICD-10-CM

## 2012-03-26 HISTORY — PX: LIVER BIOPSY: SHX301

## 2012-03-26 HISTORY — PX: APPLICATION OF WOUND VAC: SHX5189

## 2012-03-26 HISTORY — PX: LAPAROTOMY: SHX154

## 2012-03-26 LAB — BASIC METABOLIC PANEL
BUN: 99 mg/dL — ABNORMAL HIGH (ref 6–23)
CO2: 19 mEq/L (ref 19–32)
CO2: 19 mEq/L (ref 19–32)
Calcium: 7.8 mg/dL — ABNORMAL LOW (ref 8.4–10.5)
Chloride: 113 mEq/L — ABNORMAL HIGH (ref 96–112)
GFR calc Af Amer: 40 mL/min — ABNORMAL LOW (ref >90)
GFR calc non Af Amer: 34 mL/min — ABNORMAL LOW (ref >90)
Glucose, Bld: 139 mg/dL — ABNORMAL HIGH (ref 70–99)
Glucose, Bld: 204 mg/dL — ABNORMAL HIGH (ref 70–99)
Potassium: 3.8 mEq/L (ref 3.5–5.1)
Potassium: 3.8 mEq/L (ref 3.5–5.1)
Sodium: 146 mEq/L — ABNORMAL HIGH (ref 135–145)

## 2012-03-26 LAB — GLUCOSE, CAPILLARY
Glucose-Capillary: 120 mg/dL — ABNORMAL HIGH (ref 70–99)
Glucose-Capillary: 143 mg/dL — ABNORMAL HIGH (ref 70–99)

## 2012-03-26 LAB — CBC WITH DIFFERENTIAL/PLATELET
Basophils Relative: 0 % (ref 0–1)
Eosinophils Absolute: 0 10*3/uL (ref 0.0–0.7)
HCT: 20.8 % — ABNORMAL LOW (ref 39.0–52.0)
Hemoglobin: 6.2 g/dL — CL (ref 13.0–17.0)
Lymphocytes Relative: 3 % — ABNORMAL LOW (ref 12–46)
Lymphocytes Relative: 3 % — ABNORMAL LOW (ref 12–46)
Lymphs Abs: 0.2 10*3/uL — ABNORMAL LOW (ref 0.7–4.0)
MCH: 31.5 pg (ref 26.0–34.0)
MCHC: 29.8 g/dL — ABNORMAL LOW (ref 30.0–36.0)
MCV: 105.7 fL — ABNORMAL HIGH (ref 78.0–100.0)
MCV: 105.6 fL — ABNORMAL HIGH (ref 78.0–100.0)
Neutro Abs: 7.6 10*3/uL (ref 1.7–7.7)
Neutro Abs: 11.7 10*3/uL — ABNORMAL HIGH (ref 1.7–7.7)
Neutrophils Relative %: 96 % — ABNORMAL HIGH (ref 43–77)
Platelets: 56 10*3/uL — ABNORMAL LOW (ref 150–400)
RBC: 2.28 MIL/uL — ABNORMAL LOW (ref 4.22–5.81)
WBC: 7.9 10*3/uL (ref 4.0–10.5)

## 2012-03-26 LAB — PROTIME-INR: Prothrombin Time: 26.8 seconds — ABNORMAL HIGH (ref 11.6–15.2)

## 2012-03-26 LAB — BLOOD GAS, ARTERIAL
Acid-base deficit: 7 mmol/L — ABNORMAL HIGH (ref 0.0–2.0)
Bicarbonate: 17.1 mEq/L — ABNORMAL LOW (ref 20.0–24.0)
FIO2: 0.6 %
O2 Saturation: 100 %
TCO2: 18 mmol/L (ref 0–100)
pO2, Arterial: 161 mmHg — ABNORMAL HIGH (ref 80.0–100.0)

## 2012-03-26 LAB — CBC
Hemoglobin: 7.4 g/dL — ABNORMAL LOW (ref 13.0–17.0)
MCH: 31.4 pg (ref 26.0–34.0)
Platelets: 59 10*3/uL — ABNORMAL LOW (ref 150–400)
RBC: 2.36 MIL/uL — ABNORMAL LOW (ref 4.22–5.81)

## 2012-03-26 LAB — TROPONIN I: Troponin I: <0.3 ng/mL (ref <0.30)

## 2012-03-26 LAB — APTT: aPTT: 39 seconds — ABNORMAL HIGH (ref 24–37)

## 2012-03-26 LAB — PHOSPHORUS: Phosphorus: 5.5 mg/dL — ABNORMAL HIGH (ref 2.3–4.6)

## 2012-03-26 LAB — LACTIC ACID, PLASMA: Lactic Acid, Venous: 1.5 mmol/L (ref 0.5–2.2)

## 2012-03-26 LAB — PROCALCITONIN: Procalcitonin: 1.52 ng/mL

## 2012-03-26 LAB — MAGNESIUM: Magnesium: 2.2 mg/dL (ref 1.5–2.5)

## 2012-03-26 SURGERY — LAPAROTOMY, EXPLORATORY
Anesthesia: General | Site: Abdomen | Wound class: Dirty or Infected

## 2012-03-26 MED ORDER — 0.9 % SODIUM CHLORIDE (POUR BTL) OPTIME
TOPICAL | Status: DC | PRN
  Administered 2012-03-26 (×4): 1000 mL

## 2012-03-26 MED ORDER — PHENYLEPHRINE HCL 10 MG/ML IJ SOLN
INTRAMUSCULAR | Status: DC | PRN
  Administered 2012-03-26: 160 ug via INTRAVENOUS
  Administered 2012-03-26 (×4): 80 ug via INTRAVENOUS

## 2012-03-26 MED ORDER — NOREPINEPHRINE BITARTRATE 1 MG/ML IJ SOLN
2.0000 ug/min | INTRAVENOUS | Status: DC
  Administered 2012-03-26: 10 ug/min via INTRAVENOUS
  Filled 2012-03-26 (×2): qty 4

## 2012-03-26 MED ORDER — LORAZEPAM 2 MG/ML IJ SOLN
INTRAMUSCULAR | Status: AC
  Administered 2012-03-26: 0.5 mg
  Filled 2012-03-26: qty 1

## 2012-03-26 MED ORDER — OXYCODONE HCL 5 MG/5ML PO SOLN: 5.0000 mg | Freq: Once | ORAL | Status: AC | PRN

## 2012-03-26 MED ORDER — SODIUM BICARBONATE 8.4 % IV SOLN
INTRAVENOUS | Status: DC
  Administered 2012-03-26: 16:00:00 via INTRAVENOUS
  Filled 2012-03-26 (×4): qty 100

## 2012-03-26 MED ORDER — HYDROCORTISONE SOD SUCCINATE 100 MG IJ SOLR
INTRAMUSCULAR | Status: DC | PRN
  Administered 2012-03-26: 50 mg via INTRAVENOUS

## 2012-03-26 MED ORDER — HEMOSTATIC AGENTS (NO CHARGE) OPTIME
TOPICAL | Status: DC | PRN
  Administered 2012-03-26 (×2): 1 via TOPICAL

## 2012-03-26 MED ORDER — HYDROCORTISONE SOD SUCCINATE 100 MG IJ SOLR
50.0000 mg | Freq: Four times a day (QID) | INTRAMUSCULAR | Status: DC
  Administered 2012-03-26 – 2012-03-29 (×12): 50 mg via INTRAVENOUS
  Filled 2012-03-26 (×12): qty 1

## 2012-03-26 MED ORDER — PROPOFOL 10 MG/ML IV BOLUS
INTRAVENOUS | Status: DC | PRN
  Administered 2012-03-26: 100 mg via INTRAVENOUS

## 2012-03-26 MED ORDER — CIPROFLOXACIN IN D5W 400 MG/200ML IV SOLN
400.0000 mg | Freq: Two times a day (BID) | INTRAVENOUS | Status: DC
  Administered 2012-03-26 – 2012-03-29 (×7): 400 mg via INTRAVENOUS
  Filled 2012-03-26 (×10): qty 200

## 2012-03-26 MED ORDER — HYDROMORPHONE HCL PF 1 MG/ML IJ SOLN: 0.2500 mg | INTRAMUSCULAR | Status: DC | PRN

## 2012-03-26 MED ORDER — OXYCODONE HCL 5 MG PO TABS: 5.0000 mg | ORAL_TABLET | Freq: Once | ORAL | Status: AC | PRN

## 2012-03-26 MED ORDER — SODIUM CHLORIDE 0.9 % IV BOLUS (SEPSIS)
500.0000 mL | Freq: Once | INTRAVENOUS | Status: AC
  Administered 2012-03-26: 500 mL via INTRAVENOUS

## 2012-03-26 MED ORDER — FENTANYL CITRATE 0.05 MG/ML IJ SOLN
INTRAMUSCULAR | Status: DC | PRN
  Administered 2012-03-26: 50 ug via INTRAVENOUS
  Administered 2012-03-26: 100 ug via INTRAVENOUS

## 2012-03-26 MED ORDER — PHENYLEPHRINE HCL 10 MG/ML IJ SOLN
10.0000 mg | INTRAVENOUS | Status: DC | PRN
  Administered 2012-03-26: 10 ug/min via INTRAVENOUS

## 2012-03-26 MED ORDER — ROCURONIUM BROMIDE 100 MG/10ML IV SOLN
INTRAVENOUS | Status: DC | PRN
  Administered 2012-03-26: 20 mg via INTRAVENOUS
  Administered 2012-03-26: 80 mg via INTRAVENOUS

## 2012-03-26 SURGICAL SUPPLY — 53 items
BLADE SURG ROTATE 9660 (MISCELLANEOUS) IMPLANT
CANISTER SUCTION 2500CC (MISCELLANEOUS) ×2 IMPLANT
CHLORAPREP W/TINT 26ML (MISCELLANEOUS) ×2 IMPLANT
CLOTH BEACON ORANGE TIMEOUT ST (SAFETY) ×2 IMPLANT
COVER MAYO STAND STRL (DRAPES) IMPLANT
COVER SURGICAL LIGHT HANDLE (MISCELLANEOUS) ×2 IMPLANT
DRAPE LAPAROSCOPIC ABDOMINAL (DRAPES) ×2 IMPLANT
DRAPE UTILITY 15X26 W/TAPE STR (DRAPE) ×4 IMPLANT
DRAPE WARM FLUID 44X44 (DRAPE) ×2 IMPLANT
DRESSING TELFA 8X3 (GAUZE/BANDAGES/DRESSINGS) ×2 IMPLANT
ELECT BLADE 6.5 EXT (BLADE) ×2 IMPLANT
ELECT CAUTERY BLADE 6.4 (BLADE) ×2 IMPLANT
ELECT REM PT RETURN 9FT ADLT (ELECTROSURGICAL) ×2
ELECTRODE REM PT RTRN 9FT ADLT (ELECTROSURGICAL) ×1 IMPLANT
GLOVE BIO SURGEON STRL SZ 6.5 (GLOVE) ×2 IMPLANT
GLOVE BIO SURGEON STRL SZ7.5 (GLOVE) ×2 IMPLANT
GLOVE BIOGEL PI IND STRL 7.0 (GLOVE) ×3 IMPLANT
GLOVE BIOGEL PI IND STRL 7.5 (GLOVE) ×1 IMPLANT
GLOVE BIOGEL PI IND STRL 8 (GLOVE) ×1 IMPLANT
GLOVE BIOGEL PI INDICATOR 7.0 (GLOVE) ×3
GLOVE BIOGEL PI INDICATOR 7.5 (GLOVE) ×1
GLOVE BIOGEL PI INDICATOR 8 (GLOVE) ×1
GLOVE SS BIOGEL STRL SZ 7.5 (GLOVE) ×2 IMPLANT
GLOVE SUPERSENSE BIOGEL SZ 7.5 (GLOVE) ×2
GOWN PREVENTION PLUS XLARGE (GOWN DISPOSABLE) ×4 IMPLANT
GOWN STRL NON-REIN LRG LVL3 (GOWN DISPOSABLE) ×6 IMPLANT
HEMOSTAT SURGICEL 2X14 (HEMOSTASIS) ×4 IMPLANT
KIT BASIN OR (CUSTOM PROCEDURE TRAY) ×2 IMPLANT
KIT OSTOMY DRAINABLE 2.75 STR (WOUND CARE) ×2 IMPLANT
KIT ROOM TURNOVER OR (KITS) ×2 IMPLANT
LIGASURE IMPACT 36 18CM CVD LR (INSTRUMENTS) IMPLANT
NEEDLE BIOPSY 14X6 SOFT TISS (NEEDLE) ×2 IMPLANT
NS IRRIG 1000ML POUR BTL (IV SOLUTION) ×8 IMPLANT
PACK GENERAL/GYN (CUSTOM PROCEDURE TRAY) ×2 IMPLANT
PAD ARMBOARD 7.5X6 YLW CONV (MISCELLANEOUS) ×2 IMPLANT
PAD SHARPS MAGNETIC DISPOSAL (MISCELLANEOUS) IMPLANT
SPECIMEN JAR X LARGE (MISCELLANEOUS) IMPLANT
SPONGE ABDOMINAL VAC ABTHERA (MISCELLANEOUS) ×2 IMPLANT
SPONGE LAP 18X18 X RAY DECT (DISPOSABLE) ×4 IMPLANT
STAPLER VISISTAT 35W (STAPLE) ×2 IMPLANT
SUCTION POOLE TIP (SUCTIONS) ×2 IMPLANT
SUT NOVA 1 T20/GS 25DT (SUTURE) IMPLANT
SUT NOVA NAB GS-21 0 18 T12 DT (SUTURE) IMPLANT
SUT PDS AB 1 CTX 36 (SUTURE) IMPLANT
SUT SILK 2 0 SH CR/8 (SUTURE) ×4 IMPLANT
SUT SILK 2 0 TIES 10X30 (SUTURE) ×2 IMPLANT
SUT SILK 3 0 SH CR/8 (SUTURE) ×2 IMPLANT
SUT SILK 3 0 TIES 10X30 (SUTURE) ×2 IMPLANT
SYR BULB IRRIGATION 50ML (SYRINGE) IMPLANT
TOWEL OR 17X26 10 PK STRL BLUE (TOWEL DISPOSABLE) ×2 IMPLANT
TRAY FOLEY CATH 14FRSI W/METER (CATHETERS) IMPLANT
WATER STERILE IRR 1000ML POUR (IV SOLUTION) IMPLANT
YANKAUER SUCT BULB TIP NO VENT (SUCTIONS) ×2 IMPLANT

## 2012-03-26 NOTE — Progress Notes (Signed)
Patient ID: Connor Small, male   DOB: 05-30-1960, 52 y.o.   MRN: [id number] 6 Days Post-Op  Subjective: Pt has had a rough weekend.  VAC has been leaking.  Had some pain at [facility] site this weekend.  Objective: Vital signs in last 24 hours: Temp:  [97.3 F (36.3 C)-98.1 F (36.7 C)] 97.9 F (36.6 C) (03/03 0745) Pulse Rate:  [84-107] 86 (03/03 0700) Resp:  [11-23] 13 (03/03 0700) BP: (82-107)/(45-64) 102/51 mmHg (03/03 0700) SpO2:  [92 %-100 %] 94 % (03/03 0700) Weight:  [175 lb 7.8 oz (79.6 kg)-177 lb 4 oz (80.4 kg)] 177 lb 4 oz (80.4 kg) (03/03 0400) Last BM Date: 03/25/12 (Ileostomy)  Intake/Output from previous day: 03/02 0701 - 03/03 0700 In: 4460 [I.V.:2370; NG/GT:390; IV Piggyback:1700] Out: 3765 [Urine:1775; Drains:1890; Stool:100] Intake/Output this shift: Total I/O In: -  Out: 150 [Urine:150]  PE: Abd: soft, still with ascitic leak from VAC this morning, JPs with serous fluid.  VAC removed and complete evisceration noted.    Lab Results:   Recent Labs  03/25/12 1655 03/26/12 0430  WBC 11.4* 9.3  HGB 9.5* 7.4*  HCT 31.1* 24.8*  PLT 78* 59*   BMET  Recent Labs  03/25/12 1655 03/26/12 0430  NA 145 146*  K 4.9 3.8  CL 115* 115*  CO2 18* 19  GLUCOSE 114* 139*  BUN 97* 98*  CREATININE 2.16* 2.11*  CALCIUM 7.6* 7.8*   PT/INR No results found for this basename: LABPROT, INR,  in the last 72 hours CMP     Component Value Date/Time   NA 146* 03/26/2012 0430   K 3.8 03/26/2012 0430   CL 115* 03/26/2012 0430   CO2 19 03/26/2012 0430   GLUCOSE 139* 03/26/2012 0430   BUN 98* 03/26/2012 0430   CREATININE 2.11* 03/26/2012 0430   CALCIUM 7.8* 03/26/2012 0430   PROT 3.7* 03/25/2012 1655   ALBUMIN 1.5* 03/25/2012 1655   AST 235* 03/25/2012 1655   ALT 236* 03/25/2012 1655   ALKPHOS 921* 03/25/2012 1655   BILITOT 17.4* 03/25/2012 1655   GFRNONAA 34* 03/26/2012 0430   GFRAA 40* 03/26/2012 0430   Lipase     Component Value Date/Time   LIPASE 9* 02/26/2012 1409        Studies/Results: Dg Abd 1 View  03/25/2012  *RADIOLOGY REPORT*  Clinical Data: Confirm NG tube placement.  ABDOMEN - 1 VIEW  Comparison: MRCP - 03/22/2012  Findings:  NG tube tip and side port project over the left upper abdominal quadrant, overlying the expected location of the mid body of the stomach. Surgical drains overlying the peripheral aspect of the upper abdomen bilaterally.  There is a paucity of bowel gas without definite evidence of obstruction.  Prominence of the splenic shadow compatible with known perisplenic fluid collection.  Limited visualization of the lower thorax suggests left basilar opacities, likely atelectasis. No acute osseous abnormalities.  IMPRESSION: 1.  NG tube tip and side port project over the expected location of the mid body of the stomach.  2. Prominence of the splenic shadow compatible with a known perisplenic fluid collection.   Original Report Authenticated By: Tacey Ruiz, MD    Dg Chest Port 1 View  03/26/2012  *RADIOLOGY REPORT*  Clinical Data: Shortness of breath.  PORTABLE CHEST - 1 VIEW  Comparison: 03/24/2012  Findings: Central line positioning stable with the tip in the SVC. Interval nasogastric tube placement with the tube entering the stomach. Lungs show increase in atelectasis at the  right lung base and persistent atelectasis/consolidation of the left lower lobe with probable associated moderate-sized left pleural effusion.  No overt edema is identified.  Heart size is stable.  IMPRESSION: Increase in atelectasis at the right lung base.  Persistent atelectasis/consolidation of the left lower lobe with probable left pleural effusion.   Original Report Authenticated By: Irish Lack, M.D.     Anti-infectives: Anti-infectives   Start     Dose/Rate Route Frequency Ordered Stop   03/26/12 2100  vancomycin (VANCOCIN) IVPB 1000 mg/200 mL premix     1,000 mg 200 mL/hr over 60 Minutes Intravenous Every 24 hours 03/25/12 1934     03/26/12 0600   aztreonam (AZACTAM) 1 g in dextrose 5 % 50 mL IVPB     1 g 100 mL/hr over 30 Minutes Intravenous 3 times per day 03/25/12 1934     03/25/12 2200  metroNIDAZOLE (FLAGYL) IVPB 500 mg     500 mg 100 mL/hr over 60 Minutes Intravenous Every 8 hours 03/25/12 1909     03/25/12 2000  vancomycin (VANCOCIN) IVPB 1000 mg/200 mL premix     1,000 mg 200 mL/hr over 60 Minutes Intravenous STAT 03/25/12 1933 03/25/12 2054   03/25/12 2000  aztreonam (AZACTAM) 1 g in dextrose 5 % 50 mL IVPB     1 g 100 mL/hr over 30 Minutes Intravenous STAT 03/25/12 1933 03/25/12 2024   03/20/12 1145  ciprofloxacin (CIPRO) IVPB 400 mg    Comments:  ON CALL TO OR   400 mg 200 mL/hr over 60 Minutes Intravenous  Once 03/20/12 1143 03/20/12 1722   03/18/12 1000  vancomycin (VANCOCIN) 1,500 mg in sodium chloride 0.9 % 500 mL IVPB  Status:  Discontinued     1,500 mg 250 mL/hr over 120 Minutes Intravenous Every 48 hours 03/17/12 1920 03/19/12 0840   03/14/12 1000  micafungin (MYCAMINE) 100 mg in sodium chloride 0.9 % 100 mL IVPB  Status:  Discontinued     100 mg 100 mL/hr over 1 Hours Intravenous Daily 03/13/12 0632 03/19/12 0840   03/13/12 1000  micafungin (MYCAMINE) 100 mg in sodium chloride 0.9 % 100 mL IVPB     100 mg 100 mL/hr over 1 Hours Intravenous Daily 03/13/12 0632 03/13/12 1311   03/12/12 1800  vancomycin (VANCOCIN) IVPB 1000 mg/200 mL premix  Status:  Discontinued     1,000 mg 200 mL/hr over 60 Minutes Intravenous Every 24 hours 03/12/12 0859 03/17/12 1017   03/12/12 1000  imipenem-cilastatin (PRIMAXIN) 500 mg in sodium chloride 0.9 % 100 mL IVPB  Status:  Discontinued     500 mg 200 mL/hr over 30 Minutes Intravenous 3 times per day 03/12/12 0848 03/12/12 0909   03/12/12 1000  imipenem-cilastatin (PRIMAXIN) 250 mg in sodium chloride 0.9 % 100 mL IVPB  Status:  Discontinued     250 mg 200 mL/hr over 30 Minutes Intravenous 4 times per day 03/12/12 0909 03/19/12 0841   03/11/12 1200  vancomycin (VANCOCIN) 750 mg  in sodium chloride 0.9 % 150 mL IVPB  Status:  Discontinued     750 mg 150 mL/hr over 60 Minutes Intravenous Every 12 hours 03/11/12 0204 03/12/12 0858   03/11/12 0215  vancomycin (VANCOCIN) 750 mg in sodium chloride 0.9 % 150 mL IVPB     750 mg 150 mL/hr over 60 Minutes Intravenous  Once 03/11/12 0204 03/11/12 0352   03/11/12 0215  aztreonam (AZACTAM) 1 g in dextrose 5 % 50 mL IVPB  Status:  Discontinued  1 g 100 mL/hr over 30 Minutes Intravenous 3 times per day 03/11/12 0204 03/12/12 0851   03/05/12 2200  metroNIDAZOLE (FLAGYL) IVPB 500 mg  Status:  Discontinued    Comments:  First dose ASAP   500 mg 100 mL/hr over 60 Minutes Intravenous Every 8 hours 03/05/12 1346 03/19/12 1509   03/02/12 1200  vancomycin (VANCOCIN) 50 mg/mL oral solution 500 mg  Status:  Discontinued     500 mg Oral 4 times per day 03/02/12 0913 03/19/12 1509   03/01/12 1445  vancomycin (VANCOCIN) 50 mg/mL oral solution 500 mg  Status:  Discontinued     500 mg Oral 3 times per day 03/01/12 1340 03/02/12 0913   03/01/12 1400  aztreonam (AZACTAM) 1 g in dextrose 5 % 50 mL IVPB  Status:  Discontinued     1 g 100 mL/hr over 30 Minutes Intravenous 3 times per day 03/01/12 1356 03/02/12 1146   03/01/12 1300  vancomycin (VANCOCIN) 1,250 mg in sodium chloride 0.9 % 250 mL IVPB  Status:  Discontinued     1,250 mg 166.7 mL/hr over 90 Minutes Intravenous Every 12 hours 03/01/12 1127 03/02/12 1146   02/29/12 0600  ciprofloxacin (CIPRO) IVPB 400 mg     400 mg 200 mL/hr over 60 Minutes Intravenous On call to O.R. 02/28/12 1409 02/28/12 1438   02/28/12 2200  vancomycin (VANCOCIN) 500 mg in sodium chloride irrigation 0.9 % 60 mL ENEMA  Status:  Discontinued     500 mg Rectal 2 times daily 02/28/12 1504 02/28/12 1817   02/28/12 1200  vancomycin (VANCOCIN) 50 mg/mL oral solution 500 mg  Status:  Discontinued    Comments:  First dose ASAP   500 mg Per Tube 4 times per day 02/28/12 0814 02/28/12 1817   02/28/12 1000  vancomycin  (VANCOCIN) IVPB 1000 mg/200 mL premix  Status:  Discontinued     1,000 mg 200 mL/hr over 60 Minutes Intravenous Every 12 hours 02/28/12 0845 03/01/12 1127   02/28/12 1000  vancomycin (VANCOCIN) 500 mg in sodium chloride irrigation 0.9 % 100 mL ENEMA  Status:  Discontinued     500 mg Rectal 2 times daily 02/28/12 0923 02/28/12 1504   02/27/12 1600  metroNIDAZOLE (FLAGYL) IVPB 500 mg  Status:  Discontinued     500 mg 100 mL/hr over 60 Minutes Intravenous Every 8 hours 02/27/12 1546 02/27/12 2000   02/27/12 1600  ciprofloxacin (CIPRO) IVPB 400 mg  Status:  Discontinued     400 mg 200 mL/hr over 60 Minutes Intravenous Every 12 hours 02/27/12 1547 02/27/12 2000   02/27/12 1545  metroNIDAZOLE (FLAGYL) IVPB 500 mg  Status:  Discontinued     500 mg 100 mL/hr over 60 Minutes Intravenous  Once 02/27/12 1535 02/28/12 0843   02/27/12 1545  vancomycin (VANCOCIN) 500 mg in sodium chloride 0.9 % 100 mL IVPB  Status:  Discontinued     500 mg 100 mL/hr over 60 Minutes Intravenous  Once 02/27/12 1535 02/27/12 1538   02/27/12 1545  ciprofloxacin (CIPRO) IVPB 400 mg  Status:  Discontinued     400 mg 200 mL/hr over 60 Minutes Intravenous  Once 02/27/12 1538 02/28/12 0843   02/27/12 1415  vancomycin (VANCOCIN) 500 mg in sodium chloride 0.9 % 100 mL IVPB     500 mg 100 mL/hr over 60 Minutes Intravenous  Once 02/27/12 1402 02/27/12 1411   02/27/12 1400  vancomycin (VANCOCIN) powder 500 mg  Status:  Discontinued     500 mg  Other To Surgery 02/27/12 1359 02/27/12 1402   02/27/12 1200  metroNIDAZOLE (FLAGYL) IVPB 500 mg  Status:  Discontinued    Comments:  First dose ASAP   500 mg 100 mL/hr over 60 Minutes Intravenous Every 6 hours 02/27/12 0953 03/05/12 1346   02/27/12 1200  vancomycin (VANCOCIN) 50 mg/mL oral solution 250 mg  Status:  Discontinued    Comments:  First dose ASAP   250 mg Oral 4 times per day 02/27/12 0953 02/28/12 0814   02/26/12 1700  oseltamivir (TAMIFLU) capsule 75 mg     75 mg Oral  Once  02/26/12 1651 02/26/12 1715       Assessment/Plan  1. 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 Wound dehiscence with evisceration 3. Multiple other medical problems  Plan: 1. Will check a stat INR level just to make sure he does not need any blood products. 2. Emergently go to the operating room for placement of an open abdominal VAC as there is likely minimal to no fascia to sew together. 3. D/w Dr. Tyson Alias and Dr. Johna Sheriff.  We will obtain a liver biopsy while we are in there as well to evaluate the hyperbilirubinemia and failure to progress. 4. All of this has been discussed with the patient and his wife and daughter who understand and are willing to proceed with surgical intervention. 5. Patient on Vanc, flagyl, azactam already. 6. NPO, hold TFs  LOS: 29 days    Scarlet Abad E 03/26/2012, 9:13 AM Pager: [phone]

## 2012-03-26 NOTE — Progress Notes (Signed)
PULMONARY  / CRITICAL CARE MEDICINE  Name: Connor Small MRN: [id number] DOB: Dec 06, 1960    ADMISSION DATE:  02/26/2012 CONSULTATION DATE:  2/5  REFERRING MD :  Karilyn Cota  CHIEF COMPLAINT:  Acute resp failure   BRIEF PATIENT DESCRIPTION:  [name] admitted to [facility] for Cdiff on 2/2. Underwent decompressive cecostomy 2/3, initially was better, then worsened on 2/4 with increased abdominal distension and tenderness, decreased renal function.  He was taken back to the OR on 2/4 for total abdominal colectomy and end ileostomy.  Post op he developed ARDS and septic shock and was being treated for this.  The morning of 2/6 he had new onset of A-fib and transferred to [facility]   Course complicated by severe hypoalbuminemia, elevated LFTs  -cholestatic without biliary obstruction & pre-renal failure. Hypotensive 3/2 am requiring albumin  SIGNIFICANT EVENTS / STUDIES:  2/2 Admit to [facility] with C diff, toxic megacolon 2/3 Decompressive Cecostomy 2/4 Total abdominal colectomy and end ileostomy 2/6 New A-fib and transfer to [facility] health. 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 2/10 CT abd/pelvis: small hematoma likely subcapsular spleen, JPs wnl 2/11 trial TFs  2/11 UE venous dopplers: Findings consistent with superficial vein thrombosis involving the right basilic vein 2/14 LE venous dopplers: No evidence of deep vein thrombosis 2/14 LUE venous doppler: No evidence of deep vein or superficial thrombosis involving the left upper extremity  2/15 neg 4 l bal on lasix drip, lasix d/c  2/16 poor neuro status, CT head: [facility] 2/17 CRRT initiated, high pressor needs 2/17 RUQ [place]: Gallbladder wall thickening in the absence of sludge or stones but no biliary ductal dilatation 2/18 reduced vasopressor reqts 2/19 New onset thrombocytopenia. Heparin D/C'd. Argatroban  initiated 2/19 [place] abd (repeat): No explanation for elevated liver function tests 2/20 reduction pressors, improved alertness, extubated 2/20 LE venous dopplers: No evidence of deep vein or superficial thrombosis 2/21 bleeding overnight from JP's, argatroban turned off, lower pressor needs 2/22 off pressors, on RA, converted to NSR 2/22 Carotid dopplers: No significant extracranial carotid artery stenosis demonstrated 2/25 Cystoscopy with bilateral retrograde pyelograms. No extravasation of contrast from either ureteral orifice. No evidence of hydroureteronephrosis. Unremarkable urinary bladder. B ureteral stents placed 2/27 MRI brain: Scattered small acute to subacute infarcts in both cerebral hemispheres, brainstem, and right cerebellum. Favor sequelae of emboli from a cardiac or proximal aortic source. Main alternate consideration is hypotensive episode 2/27 MRCP: No intrahepatic bile duct dilatation or common bile duct dilatation. No gallstones identified. Large subcapsular fluid collection encases the spleen. This is of indeterminate etiology. This may represent a subacute to chronic subcapsular hematoma or abscess. 2/28 SLP eval: FEES performed. Oropharyngeal weakness noted. Recommend replacement of NGT to supplement PO intake 3/1 Renal signed off 3/3- bowel evisceration, to OR  LINES / TUBES: ETT ([facility]) 2/4>>> 2/20 PICC ([facility]) 2/4>>>2/11 Art Line ([facility]) 2/5>>>out L IJ CVL 2/06 >> out A line R fem 2/18>>>2/24 Rt ij HD 2/17 >> out Left IJ 2/18 >>   CULTURES: BCx2 2/4>>>negative BCx2 2/3>>>negative UC 2/3- Negative MRSA PCR 2/3- Negative C diff 2/6>>>Positive Body fluid 2/9>>>WBCs, no organisms 2/15 BC x 2 >> NEG 2/15 Sputum >>>NF 3/2 bc>>> 3/2 urine>>> 3/2 sputum>>>  ANTIBIOTICS: Flagyl 2/3>> d/c'd Vanc 2/6 (IV) >>>2/7 azactam 2/6>>>2/7 Oral vanc 2/6>>>off IV vanc 2/16 >2/24 Aztreonam 2/16 >2/17 Imipenem 2/17>>>2/24 mycofungin 2/18>>>2/24  cipro 3/3>>> azactam  3/2>>>3/3 vanc 3/2>>> Flagyl 3/3>>>  SUBJECTIVE:  Bowel eviceration, hypotension  VITAL SIGNS: Temp:  [97.3 F (36.3 C)-98.1 F (36.7 C)] 97.9 F (36.6 C) (03/03 0745) Pulse Rate:  [84-107] 86 (03/03 0700) Resp:  [11-23] 13 (03/03 0700) BP: (82-107)/(45-64) 102/51 mmHg (03/03 0700) SpO2:  [92 %-100 %] 94 % (03/03 0700) Weight:  [79.6 kg (175 lb 7.8 oz)-80.4 kg (177 lb 4 oz)] 80.4 kg (177 lb 4 oz) (03/03 0400)    INTAKE / OUTPUT: Intake/Output     03/02 0701 - 03/03 0700 03/03 0701 - 03/04 0700   P.O.     I.V. (mL/kg) 2370 (29.5)    NG/GT 390    IV Piggyback 1700    Total Intake(mL/kg) 4460 (55.5)    Urine (mL/kg/hr) 1775 (0.9) 150 (0.8)   Drains 1890 (1)    Stool 100 (0.1)    Total Output 3765 150   Net +695 -150          PHYSICAL EXAMINATION: General Icteric, NAD Neuro: diffusely weak, awakens follows commands HEENT: Sclericterus, EOMI, PERRL Cardiovascular: RRR s M Lungs: clear anteriorly, diminished in bases left worse than rt  Abdomen: soft, NT, + BS, bowel noted outside of vac Musculoskeletal: 2+ LE edema  Skin: rash improved  LABS: BMET    Component Value Date/Time   NA 146* 03/26/2012 0430   K 3.8 03/26/2012 0430   CL 115* 03/26/2012 0430   CO2 19 03/26/2012 0430   GLUCOSE 139* 03/26/2012 0430   BUN 98* 03/26/2012 0430   CREATININE 2.11* 03/26/2012 0430   CALCIUM 7.8* 03/26/2012 0430   GFRNONAA 34* 03/26/2012 0430   GFRAA 40* 03/26/2012 0430    Hepatic Function Panel     Component Value Date/Time   PROT 3.7* 03/25/2012 1655   ALBUMIN 1.5* 03/25/2012 1655   AST 235* 03/25/2012 1655   ALT 236* 03/25/2012 1655   ALKPHOS 921* 03/25/2012 1655   BILITOT 17.4* 03/25/2012 1655   BILIDIR 14.2* 03/24/2012 0355   IBILI 4.4* 03/24/2012 0355    CBC    Component Value Date/Time   WBC 9.3 03/26/2012 0430   RBC 2.36* 03/26/2012 0430   HGB 7.4* 03/26/2012 0430   HCT 24.8* 03/26/2012 0430   PLT 59* 03/26/2012 0430   MCV 105.1* 03/26/2012 0430   MCH 31.4 03/26/2012 0430   MCHC 29.8* 03/26/2012  0430   RDW 21.0* 03/26/2012 0430   LYMPHSABS 0.4* 03/22/2012 0428   MONOABS 0.3 03/22/2012 0428   EOSABS 0.0 03/22/2012 0428   BASOSABS 0.0 03/22/2012 0428      Recent Labs Lab 03/25/12 1533 03/25/12 1931 03/25/12 2342 03/26/12 0400 03/26/12 0738  GLUCAP 105* 120* 112* 123* 143*    Dg Abd 1 View  03/25/2012  *RADIOLOGY REPORT*  Clinical Data: Confirm NG tube placement.  ABDOMEN - 1 VIEW  Comparison: MRCP - 03/22/2012  Findings:  NG tube tip and side port project over the left upper abdominal quadrant, overlying the expected location of the mid body of the stomach. Surgical drains overlying the peripheral aspect of the upper abdomen bilaterally.  There is a paucity of bowel gas without definite evidence of obstruction.  Prominence of the splenic shadow compatible with known perisplenic fluid collection.  Limited visualization of the lower thorax suggests left basilar opacities, likely atelectasis. No acute osseous abnormalities.  IMPRESSION: 1.  NG tube tip and side port project over the expected location of the mid body of the stomach.  2. Prominence of the splenic shadow compatible  with a known perisplenic fluid collection.   Original Report Authenticated By: Tacey Ruiz, MD    Dg Chest Port 1 View  03/26/2012  *RADIOLOGY REPORT*  Clinical Data: Shortness of breath.  PORTABLE CHEST - 1 VIEW  Comparison: 03/24/2012  Findings: Central line positioning stable with the tip in the SVC. Interval nasogastric tube placement with the tube entering the stomach. Lungs show increase in atelectasis at the right lung base and persistent atelectasis/consolidation of the left lower lobe with probable associated moderate-sized left pleural effusion.  No overt edema is identified.  Heart size is stable.  IMPRESSION: Increase in atelectasis at the right lung base.  Persistent atelectasis/consolidation of the left lower lobe with probable left pleural effusion.   Original Report Authenticated By: Irish Lack, M.D.       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 -Neuro to re-evaluated new MRI findings> see Sethi note 3/1 > rx when plt's better as he had prob af> emboli Not a candidate for heparin , Avoiding all anticoagulants if possible due to high risk of bleed OT post op -PT post op    Severe muscle deconditioning   Shoulder subluxation, right - Cont PT/OT discuss R shoulder subluxation with Ortho OT recs  GI Wound evisceration    Hyperbilirubinemia, due to cholestasis - Monitor hepatic function panel  Dysphagia due to oropharyngeal weakness -have asked surgery to consider liver bx intra op, eval ischemia and splenic area -Back to OR now -empiric abx    Thrombocytopenia, unspecified, HIT panel negative    Coagulopathy - likely due to hepatic dysfunction  Anemia, mild. No evidence of active blood loss. No indication for PRBCs   Small splenic hematoma? -cbc repeat for drop in hgb -scd -post op low threshold re add sub q heparin -liver bx  Pulm    Pleural effusion, L > R HIGH risk post op resp failure -allow pos balance for now, albumin -Will need lasix in future when BP allows -eval post op resp failure, vent likely -did get extubated post op cysto general last week  Renal   ARF (acute renal failure), Cr leveling off -?now CKD   Hypernatremia, improving -maintain bicarb, increase 2 amps -chem in am   Endocrine    Relative adrenal insufficiency - hydrocortisone increase 3-2 for hypotension  increase roids to q6h  ID Resolved cdiff with MODS Bowel  evisceration -continue vanc, cipro, flagyl -had rash imipenem>? Will add  Resolved Problems:    history of UC (ulcerative colitis)   Atrial fibrillation, resolved   Acute respiratory failure, resolved   Septic shock, resolved   RUE DVT related to PICC, resolved   Hypokalemia, resolved   Hyperglycemia, resolved   Acute encephalopathy, resolved   Ccm time 30 min  See post op as well  Updated wife in detail  Mcarthur Rossetti. Tyson Alias, MD, FACP Pgr: [phone] [place] [facility]

## 2012-03-26 NOTE — Progress Notes (Signed)
  Echocardiogram 2D Echocardiogram has been performed.  Connor Small, Connor Small 03/26/2012, 5:27 PM

## 2012-03-26 NOTE — Consult Note (Signed)
Wound care consult: Consult requested by [facility] team for vac dressing change assistance.  Removed vac dressing with Saverio Danker, PA at bedside.  Bowel dehiscence visible when mepitel and black sponge removed.  [facility] plans to take to surgery; refer to their notes for further assessment and plan of care. Will not plan to follow further unless re-consulted.  [address], [place], MSN, [place]

## 2012-03-26 NOTE — Progress Notes (Signed)
Patient ID: Connor Small, male   DOB: [birth year], 52 y.o.   MRN: [id number] Pt found to have complete abdominal dehisence today at [facility] change wit exposed small bowel. Will need OR to explore wound and abdomen, probable placement of abdominal VAC.  Will also consider liver Bx.  Discussed with patient and wife.  Edward Jolly MD, FACS  03/26/2012, 9:13 AM

## 2012-03-26 NOTE — Consult Note (Addendum)
Reason for Consult:right shoulder inferior subluxation on xray Referring Physician:     Dr. Alesia Morin is an 52 y.o. male.  HPI: pt with complex GI Hx , c diff, toxic megacolon , Ulcerative colitis, divertic perforation, with sepsis and right UE weakness. Denies any shoulder pain. Cannot lift arm in the air.   Past Medical History  Diagnosis Date  . Chronic diarrhea   . Rectal bleed   . Hemorrhoids   . Ulcerative colitis     Distal UC over 8 yrs ago diagnosed  . Diverticulitis of large intestine with perforation 10/2011    done at [facility]    Past Surgical History  Procedure Laterality Date  . Colonoscopy  9/03  . Sigmoidoscopy       06/13/2002  . Temporary ostomy November 06, 2010      for a colon perforation that was done in [facility] (Dr Payton Doughty).    . Colonoscopy  02/16/2011    Procedure: COLONOSCOPY;  Surgeon: Rogene Houston, MD;  Location: [facility];  Service: Endoscopy;  Laterality: N/A;  100  . Flexible sigmoidoscopy  02/27/2012    Procedure: FLEXIBLE SIGMOIDOSCOPY;  Surgeon: Rogene Houston, MD;  Location: [facility];  Service: Endoscopy;  Laterality: N/A;  with colonic decompression  . Laparotomy  02/27/2012    Procedure: EXPLORATORY LAPAROTOMY;  Surgeon: Donato Heinz, MD;  Location: [facility];  Service: General;  Laterality: N/A;  . Cecostomy  02/27/2012    Procedure: CECOSTOMY;  Surgeon: Donato Heinz, MD;  Location: [facility];  Service: General;  Laterality: N/A;  Cecostomy Tube Placement  . Colectomy  02/28/2012    Procedure: TOTAL COLECTOMY;  Surgeon: Donato Heinz, MD;  Location: [facility];  Service: General;  Laterality: N/A;  . Ileostomy  02/28/2012    Procedure: ILEOSTOMY;  Surgeon: Donato Heinz, MD;  Location: [facility];  Service: General;  Laterality: N/A;  . Cystoscopy w/ ureteral stent placement Bilateral 03/20/2012    Procedure: CYSTOSCOPY WITH RETROGRADE PYELOGRAM/URETERAL STENT PLACEMENT;  Surgeon: Alexis Frock, MD;  Location: [place];  Service:  Urology;  Laterality: Bilateral;    Family History  Problem Relation Age of Onset  . Cancer Sister   . Healthy Daughter   . Hypertension Mother     Social History:  reports that he quit smoking about 27 years ago. His smoking use included Cigarettes. He smoked 0.00 packs per day. He has never used smokeless tobacco. He reports that he does not drink alcohol or use illicit drugs.  Allergies:  Allergies  Allergen Reactions  . Penicillins Other (See Comments)    Heart rate changes    Medications: I have reviewed the patient's current medications.  Results for orders placed during the hospital encounter of 02/26/12 (from the past 48 hour(s))  GLUCOSE, CAPILLARY     Status: None   Collection Time    03/24/12  1:06 PM      Result Value Range   Glucose-Capillary 72  70 - 99 mg/dL   Comment 1 Notify RN    GLUCOSE, CAPILLARY     Status: None   Collection Time    03/24/12  9:59 PM      Result Value Range   Glucose-Capillary 82  70 - 99 mg/dL   Comment 1 Notify RN    GLUCOSE, CAPILLARY     Status: None   Collection Time    03/25/12  6:22 AM  Result Value Range   Glucose-Capillary 80  70 - 99 mg/dL   Comment 1 Notify RN    TROPONIN I     Status: None   Collection Time    03/25/12 11:14 AM      Result Value Range   Troponin I <0.30  <0.30 ng/mL   Comment:            Due to the release kinetics of cTnI,     a negative result within the first hours     of the onset of symptoms does not rule out     myocardial infarction with certainty.     If myocardial infarction is still suspected,     repeat the test at appropriate intervals.  PROCALCITONIN     Status: None   Collection Time    03/25/12 11:38 AM      Result Value Range   Procalcitonin 1.39     Comment:            Interpretation:     PCT > 0.5 ng/mL and <= 2 ng/mL:     Systemic infection (sepsis) is possible,     but other conditions are known to elevate     PCT as well.     (NOTE)             ICU PCT Algorithm                Non ICU PCT Algorithm        ----------------------------     ------------------------------             PCT < 0.25 ng/mL                 PCT < 0.1 ng/mL         Stopping of antibiotics            Stopping of antibiotics           strongly encouraged.               strongly encouraged.        ----------------------------     ------------------------------           PCT level decrease by               PCT < 0.25 ng/mL           >= 80% from peak PCT           OR PCT 0.25 - 0.5 ng/mL          Stopping of antibiotics                                                 encouraged.         Stopping of antibiotics               encouraged.        ----------------------------     ------------------------------           PCT level decrease by              PCT >= 0.25 ng/mL           < 80% from peak PCT            AND PCT >= 0.5 ng/mL  Continuing antibiotics                                                  encouraged.           Continuing antibiotics                encouraged.        ----------------------------     ------------------------------         PCT level increase compared          PCT > 0.5 ng/mL             with peak PCT AND              PCT >= 0.5 ng/mL             Escalation of antibiotics                                              strongly encouraged.          Escalation of antibiotics            strongly encouraged.  URINALYSIS, ROUTINE W REFLEX MICROSCOPIC     Status: Abnormal   Collection Time    03/25/12 11:54 AM      Result Value Range   Color, Urine ORANGE (*) YELLOW   Comment: BIOCHEMICALS MAY BE AFFECTED BY COLOR   APPearance CLOUDY (*) CLEAR   Specific Gravity, Urine 1.019  1.005 - 1.030   pH 5.0  5.0 - 8.0   Glucose, UA NEGATIVE  NEGATIVE mg/dL   Hgb urine dipstick LARGE (*) NEGATIVE   Bilirubin Urine LARGE (*) NEGATIVE   Ketones, ur NEGATIVE  NEGATIVE mg/dL   Protein, ur 30 (*) NEGATIVE mg/dL   Urobilinogen, UA 0.2  0.0 - 1.0 mg/dL    Nitrite NEGATIVE  NEGATIVE   Leukocytes, UA TRACE (*) NEGATIVE  URINE MICROSCOPIC-ADD ON     Status: Abnormal   Collection Time    03/25/12 11:54 AM      Result Value Range   Squamous Epithelial / LPF RARE  RARE   WBC, UA 0-2  <3 WBC/hpf   RBC / HPF 0-2  <3 RBC/hpf   Casts GRANULAR CAST (*) NEGATIVE   Urine-Other AMORPHOUS URATES/PHOSPHATES    GLUCOSE, CAPILLARY     Status: None   Collection Time    03/25/12  1:09 PM      Result Value Range   Glucose-Capillary 83  70 - 99 mg/dL  URINALYSIS, ROUTINE W REFLEX MICROSCOPIC     Status: Abnormal   Collection Time    03/25/12  2:44 PM      Result Value Range   Color, Urine ORANGE (*) YELLOW   Comment: BIOCHEMICALS MAY BE AFFECTED BY COLOR   APPearance CLOUDY (*) CLEAR   Specific Gravity, Urine 1.021  1.005 - 1.030   pH 5.0  5.0 - 8.0   Glucose, UA NEGATIVE  NEGATIVE mg/dL   Hgb urine dipstick LARGE (*) NEGATIVE   Bilirubin Urine LARGE (*) NEGATIVE   Ketones, ur NEGATIVE  NEGATIVE mg/dL   Protein, ur 30 (*) NEGATIVE mg/dL   Urobilinogen, UA 0.2  0.0 - 1.0 mg/dL   Nitrite NEGATIVE  NEGATIVE   Leukocytes, UA TRACE (*) NEGATIVE  URINE MICROSCOPIC-ADD ON     Status: Abnormal   Collection Time    03/25/12  2:44 PM      Result Value Range   Squamous Epithelial / LPF RARE  RARE   WBC, UA 0-2  <3 WBC/hpf   Casts GRANULAR CAST (*) NEGATIVE   Urine-Other MUCOUS PRESENT     Comment: AMORPHOUS URATES/PHOSPHATES  GLUCOSE, CAPILLARY     Status: Abnormal   Collection Time    03/25/12  3:33 PM      Result Value Range   Glucose-Capillary 105 (*) 70 - 99 mg/dL  TROPONIN I     Status: None   Collection Time    03/25/12  4:55 PM      Result Value Range   Troponin I <0.30  <0.30 ng/mL   Comment:            Due to the release kinetics of cTnI,     a negative result within the first hours     of the onset of symptoms does not rule out     myocardial infarction with certainty.     If myocardial infarction is still suspected,     repeat the  test at appropriate intervals.  COMPREHENSIVE METABOLIC PANEL     Status: Abnormal   Collection Time    03/25/12  4:55 PM      Result Value Range   Sodium 145  135 - 145 mEq/L   Potassium 4.9  3.5 - 5.1 mEq/L   Chloride 115 (*) 96 - 112 mEq/L   CO2 18 (*) 19 - 32 mEq/L   Glucose, Bld 114 (*) 70 - 99 mg/dL   BUN 97 (*) 6 - 23 mg/dL   Creatinine, Ser 2.16 (*) 0.50 - 1.35 mg/dL   Comment: ICTERUS AT THIS LEVEL MAY AFFECT RESULT   Calcium 7.6 (*) 8.4 - 10.5 mg/dL   Total Protein 3.7 (*) 6.0 - 8.3 g/dL   Albumin 1.5 (*) 3.5 - 5.2 g/dL   AST 235 (*) 0 - 37 U/L   ALT 236 (*) 0 - 53 U/L   Alkaline Phosphatase 921 (*) 39 - 117 U/L   Total Bilirubin 17.4 (*) 0.3 - 1.2 mg/dL   GFR calc non Af Amer 33 (*) >90 mL/min   GFR calc Af Amer 38 (*) >90 mL/min   Comment:            The eGFR has been calculated     using the CKD EPI equation.     This calculation has not been     validated in all clinical     situations.     eGFR's persistently     <90 mL/min signify     possible Chronic Kidney Disease.  MAGNESIUM     Status: None   Collection Time    03/25/12  4:55 PM      Result Value Range   Magnesium 2.3  1.5 - 2.5 mg/dL  CBC     Status: Abnormal   Collection Time    03/25/12  4:55 PM      Result Value Range   WBC 11.4 (*) 4.0 - 10.5 K/uL   Comment: WHITE COUNT CONFIRMED ON SMEAR   RBC 3.01 (*) 4.22 - 5.81 MIL/uL   Hemoglobin 9.5 (*) 13.0 - 17.0 g/dL   HCT 31.1 (*) 39.0 - 52.0 %   MCV 103.3 (*) 78.0 - 100.0 fL  MCH 31.6  26.0 - 34.0 pg   MCHC 30.5  30.0 - 36.0 g/dL   RDW 21.1 (*) 11.5 - 15.5 %   Platelets 78 (*) 150 - 400 K/uL   Comment: PLATELET COUNT CONFIRMED BY SMEAR  GLUCOSE, CAPILLARY     Status: Abnormal   Collection Time    03/25/12  7:31 PM      Result Value Range   Glucose-Capillary 120 (*) 70 - 99 mg/dL  TROPONIN I     Status: None   Collection Time    03/25/12 11:14 PM      Result Value Range   Troponin I <0.30  <0.30 ng/mL   Comment:            Due to the  release kinetics of cTnI,     a negative result within the first hours     of the onset of symptoms does not rule out     myocardial infarction with certainty.     If myocardial infarction is still suspected,     repeat the test at appropriate intervals.  GLUCOSE, CAPILLARY     Status: Abnormal   Collection Time    03/25/12 11:42 PM      Result Value Range   Glucose-Capillary 112 (*) 70 - 99 mg/dL  GLUCOSE, CAPILLARY     Status: Abnormal   Collection Time    03/26/12  4:00 AM      Result Value Range   Glucose-Capillary 123 (*) 70 - 99 mg/dL  CBC     Status: Abnormal   Collection Time    03/26/12  4:30 AM      Result Value Range   WBC 9.3  4.0 - 10.5 K/uL   RBC 2.36 (*) 4.22 - 5.81 MIL/uL   Hemoglobin 7.4 (*) 13.0 - 17.0 g/dL   Comment: DELTA CHECK NOTED     REPEATED TO VERIFY   HCT 24.8 (*) 39.0 - 52.0 %   MCV 105.1 (*) 78.0 - 100.0 fL   MCH 31.4  26.0 - 34.0 pg   MCHC 29.8 (*) 30.0 - 36.0 g/dL   RDW 21.0 (*) 11.5 - 15.5 %   Platelets 59 (*) 150 - 400 K/uL   Comment: DELTA CHECK NOTED     REPEATED TO VERIFY     PLATELET COUNT CONFIRMED BY SMEAR  BASIC METABOLIC PANEL     Status: Abnormal   Collection Time    03/26/12  4:30 AM      Result Value Range   Sodium 146 (*) 135 - 145 mEq/L   Potassium 3.8  3.5 - 5.1 mEq/L   Comment: DELTA CHECK NOTED   Chloride 115 (*) 96 - 112 mEq/L   CO2 19  19 - 32 mEq/L   Glucose, Bld 139 (*) 70 - 99 mg/dL   BUN 98 (*) 6 - 23 mg/dL   Creatinine, Ser 2.11 (*) 0.50 - 1.35 mg/dL   Comment: ICTERUS AT THIS LEVEL MAY AFFECT RESULT   Calcium 7.8 (*) 8.4 - 10.5 mg/dL   GFR calc non Af Amer 34 (*) >90 mL/min   GFR calc Af Amer 40 (*) >90 mL/min   Comment:            The eGFR has been calculated     using the CKD EPI equation.     This calculation has not been     validated in all clinical     situations.     eGFR's persistently     <  90 mL/min signify     possible Chronic Kidney Disease.  PHOSPHORUS     Status: Abnormal   Collection  Time    03/26/12  4:30 AM      Result Value Range   Phosphorus 5.5 (*) 2.3 - 4.6 mg/dL  MAGNESIUM     Status: None   Collection Time    03/26/12  4:30 AM      Result Value Range   Magnesium 2.2  1.5 - 2.5 mg/dL  PROCALCITONIN     Status: None   Collection Time    03/26/12  4:30 AM      Result Value Range   Procalcitonin 1.52     Comment:            Interpretation:     PCT > 0.5 ng/mL and <= 2 ng/mL:     Systemic infection (sepsis) is possible,     but other conditions are known to elevate     PCT as well.     (NOTE)             ICU PCT Algorithm               Non ICU PCT Algorithm        ----------------------------     ------------------------------             PCT < 0.25 ng/mL                 PCT < 0.1 ng/mL         Stopping of antibiotics            Stopping of antibiotics           strongly encouraged.               strongly encouraged.        ----------------------------     ------------------------------           PCT level decrease by               PCT < 0.25 ng/mL           >= 80% from peak PCT           OR PCT 0.25 - 0.5 ng/mL          Stopping of antibiotics                                                 encouraged.         Stopping of antibiotics               encouraged.        ----------------------------     ------------------------------           PCT level decrease by              PCT >= 0.25 ng/mL           < 80% from peak PCT            AND PCT >= 0.5 ng/mL            Continuing antibiotics  encouraged.           Continuing antibiotics                encouraged.        ----------------------------     ------------------------------         PCT level increase compared          PCT > 0.5 ng/mL             with peak PCT AND              PCT >= 0.5 ng/mL             Escalation of antibiotics                                              strongly encouraged.          Escalation of antibiotics             strongly encouraged.  OCCULT BLOOD X 1 CARD TO LAB, STOOL     Status: Abnormal   Collection Time    03/26/12  6:08 AM      Result Value Range   Fecal Occult Bld POSITIVE (*) NEGATIVE  GLUCOSE, CAPILLARY     Status: Abnormal   Collection Time    03/26/12  7:38 AM      Result Value Range   Glucose-Capillary 143 (*) 70 - 99 mg/dL    Dg Abd 1 View  03/25/2012  *RADIOLOGY REPORT*  Clinical Data: Confirm NG tube placement.  ABDOMEN - 1 VIEW  Comparison: MRCP - 03/22/2012  Findings:  NG tube tip and side port project over the left upper abdominal quadrant, overlying the expected location of the mid body of the stomach. Surgical drains overlying the peripheral aspect of the upper abdomen bilaterally.  There is a paucity of bowel gas without definite evidence of obstruction.  Prominence of the splenic shadow compatible with known perisplenic fluid collection.  Limited visualization of the lower thorax suggests left basilar opacities, likely atelectasis. No acute osseous abnormalities.  IMPRESSION: 1.  NG tube tip and side port project over the expected location of the mid body of the stomach.  2. Prominence of the splenic shadow compatible with a known perisplenic fluid collection.   Original Report Authenticated By: Jake Seats, MD    Dg Chest Port 1 View  03/26/2012  *RADIOLOGY REPORT*  Clinical Data: Shortness of breath.  PORTABLE CHEST - 1 VIEW  Comparison: 03/24/2012  Findings: Central line positioning stable with the tip in the SVC. Interval nasogastric tube placement with the tube entering the stomach. Lungs show increase in atelectasis at the right lung base and persistent atelectasis/consolidation of the left lower lobe with probable associated moderate-sized left pleural effusion.  No overt edema is identified.  Heart size is stable.  IMPRESSION: Increase in atelectasis at the right lung base.  Persistent atelectasis/consolidation of the left lower lobe with probable left pleural effusion.    Original Report Authenticated By: Aletta Edouard, M.D.     Review of Systems  Constitutional: Positive for fever, chills, weight loss and malaise/fatigue.  Respiratory:       ARF  Cardiovascular: Negative.   Gastrointestinal:       Toxic megacolon, Ulcerative collitis, colon perforation, c. Diff. Colostomy,    Neurological: Negative for tingling, sensory change and focal weakness.  Blood pressure 102/51, pulse 86, temperature 97.9 F (36.6 C), temperature source Oral, resp. rate 13, height 5' 9"  (1.753 m), weight 80.4 kg (177 lb 4 oz), SpO2 94.00%. Physical Exam  Constitutional: He appears well-developed and well-nourished.  HENT:  Head: Normocephalic.  Eyes: Pupils are equal, round, and reactive to light.  Neck: No thyromegaly present.  Cardiovascular: Normal rate.   Respiratory:  Mild labored respirations,   Musculoskeletal:  Decreased right UE motor, biceps , triceps, WF, WE, GRIP all decreased 0-3/5  Skin: Skin is warm and dry.    Assessment/Plan:   Right UE partial hemiparesis    Plan:     OT consult.   Has foci of micro infarcts on MRI brain all on left side causing his right side weakness.   YATES,MARK C 03/26/2012, 7:58 AM   xrays with slight inferior humeral head position is due to UE weakness, not a dislocation. Shoulder is reduced with good passive ROM.  Continue OT.     Nolon Stalls [phone]

## 2012-03-26 NOTE — Op Note (Signed)
Preoperative Diagnosis: #1 abdominal wound dehiscence with evisceration #2 jaundice  Postoprative Diagnosis: same  Procedure: Procedure(s): EXPLORATORY LAPAROTOMY APPLICATION OF WOUND VAC LIVER BIOPSY   Surgeon: Excell Seltzer T   Assistants: Coralie Keens PA  Anesthesia:  General endotracheal anesthesiaDiagnos  Indications:    patient is a 52 year old male with a history of ulcerative colitis who is approximately 3 weeks status post total abdominal colectomy and ileostomy for severe C. Difficile colitis with the procedure performed at an outside facility. He was transferred here for critical care. He had an abdominal wound VAC in place with a known small area of fascial dehiscence. At his wound VAC change today he was found to have much more extensive disruption of his abdominal wound with evisceration of small bowel. I have recommended returning to the operating room for laparotomy and abdominal closure versus more likely abdominal wound VAC placement. Liver biopsy has also been requested by critical care medicine due to his persistent severe jaundice. I discussed this with the patient he has limited understanding at this point but also with his wife including indications and risks of the procedure all questions were answered.  Procedure Detail:  Patient is brought to the operating room, placed in the supine position on the operating table, and general endotracheal anesthesia induced. His abdominal dressing was removed. Ileostomy device was removed. The abdominal wall was prepped with Betadine and draped. There had been disruption of the majority of his fascia. Suture material was spanning the defect in the PML wire type of affect and I removed the suture material which was a running suture and the remainder of the wound essentially fell open from top to bottom. There was a small amount of necrotic subcutaneous tissue and fascia on either side which was debrided with cautery. I did a thorough  abdominal exploration at that time. There was serous abdominal fluid but minimal exudate no a few loops of small bowel and no evidence of abscess or perforation. The entire small bowel was run from the ligament of Treitz to the ileostomy. There were some adhesions particularly along the right colon mesentery and as I was taking these down to examine the small bowel there was some moderate bleeding from the mesentery and retroperitoneum on the right side of the abdomen. I left a few adhesions here so as to not cause further bleeding. The gallbladder was a little edematous but appeared normal. The liver was grossly unremarkable. The area of bleeding in the right retroperitoneum along the mesentery was controlled with several figure-of-eight sutures of 2-0 silk and I placed Surgicel and dry gauze pack. While this was in place a Tru-Cut core needle biopsy was performed from the inferior to the right lobe of the liver with a good specimen obtained and coagulation obtained with cautery without any difficulty. Following this the right retroperitoneal area was observed and packed a couple more times with dry gauze and eventually there was very minimal oozing without any specific bleeding point. A careful exam of the fascia and abdominal wall the fascia was in poor condition and with only close with quite a bit of tension therefore elected to convert to an open abdomen with abdominal vac dressing. The inner dressing was cut to size with a slit to go around the ileostomy and it was placed over the viscera and well back under the abdominal wall all directions. The wound sponge was placed over this and then the seal and suction placed with good closure of the wound. Ostomy device was  placed. The patient was taken to ICU directly intubated.  Findings: As above  Estimated Blood Loss:  200 mL         Drains: open abdomen Vac dressing  Blood Given: none          Specimens: liver biopsy, Tru-Cut        Complications:  *  No complications entered in OR log *         Disposition: ICU - intubated and hemodynamically stable.         Condition: stable

## 2012-03-26 NOTE — Progress Notes (Signed)
Patient interviewed and examined, agree with PA note above. Will need exploration and wound closure or abdominal VAC placement in the operating room.  Edward Jolly MD, FACS  03/26/2012 7:25 PM

## 2012-03-26 NOTE — Progress Notes (Signed)
eLink Physician-Brief Progress Note Patient Name: Connor Small DOB: 13-Jun-1960 MRN: [id number]  Date of Service  03/26/2012   HPI/Events of Note   Call from nurse reporting hgb down from 9.0s to 7.4 today.  Has been receiving IVFs and albumin for BP support.  Dark stool out of colostomy.  eICU Interventions  Plan: Monitor Send stool for occult blood   Intervention Category Minor Interventions: Clinical assessment - ordering diagnostic tests  DETERDING,ELIZABETH 03/26/2012, 5:45 AM

## 2012-03-26 NOTE — Progress Notes (Signed)
PT Cancellation Note  Patient Details Name: BRANDEN SHALLENBERGER MRN: [id number] DOB: 1961/01/23   Cancelled Treatment:    Reason Eval/Treat Not Completed: Patient at procedure or test/unavailable, pt in OR   SASSER,LYNN 03/26/2012, 1:06 PM Pager [phone]

## 2012-03-26 NOTE — Preoperative (Signed)
Beta Blockers   Reason not to administer Beta Blockers:Not Applicable 

## 2012-03-26 NOTE — Anesthesia Preprocedure Evaluation (Addendum)
Anesthesia Evaluation  Patient identified by MRN, date of birth, ID band Patient confused    Reviewed: Allergy & Precautions, H&P , NPO status , Patient's Chart, lab work & pertinent test results  Airway Mallampati: II TM Distance: >3 FB Neck ROM: full    Dental  (+) Teeth Intact and Dental Advidsory Given   Pulmonary neg pulmonary ROS,  breath sounds clear to auscultation        Cardiovascular + dysrhythmias Atrial Fibrillation Rhythm:regular Rate:Normal     Neuro/Psych Sedated negative psych ROS   GI/Hepatic Neg liver ROS, PUD, c-diff   Endo/Other  Adrenal isufficiency  Renal/GU ARFRenal disease  negative genitourinary   Musculoskeletal   Abdominal   Peds  Hematology  (+) anemia ,   Anesthesia Other Findings   Reproductive/Obstetrics negative OB ROS                          Anesthesia Physical Anesthesia Plan  ASA: IV and emergent  Anesthesia Plan: General   Post-op Pain Management:    Induction: Intravenous, Rapid sequence and Cricoid pressure planned  Airway Management Planned: Oral ETT  Additional Equipment: Arterial line  Intra-op Plan:   Post-operative Plan: Extubation in OR and Possible Post-op intubation/ventilation  Informed Consent: I have reviewed the patients History and Physical, chart, labs and discussed the procedure including the risks, benefits and alternatives for the proposed anesthesia with the patient or authorized representative who has indicated his/her understanding and acceptance.   Dental advisory given  Plan Discussed with: CRNA  Anesthesia Plan Comments:       Anesthesia Quick Evaluation

## 2012-03-26 NOTE — Anesthesia Procedure Notes (Signed)
Procedure Name: Intubation Date/Time: 03/26/2012 12:09 PM Performed by: Scheryl Darter Pre-anesthesia Checklist: Patient identified, Emergency Drugs available, Suction available, Patient being monitored and Timeout performed Patient Re-evaluated:Patient Re-evaluated prior to inductionOxygen Delivery Method: Circle system utilized Preoxygenation: Pre-oxygenation with 100% oxygen Intubation Type: IV induction Ventilation: Mask ventilation without difficulty Laryngoscope Size: Mac and 4 Grade View: Grade II Tube type: Subglottic suction tube Tube size: 8.0 mm Number of attempts: 1 Airway Equipment and Method: Stylet Placement Confirmation: ETT inserted through vocal cords under direct vision,  positive ETCO2 and breath sounds checked- equal and bilateral Secured at: 23 cm Tube secured with: Tape Dental Injury: Teeth and Oropharynx as per pre-operative assessment

## 2012-03-26 NOTE — Progress Notes (Signed)
PULMONARY  / CRITICAL CARE MEDICINE  Name: Connor Small MRN: [id number] DOB: 1960-02-05    ADMISSION DATE:  02/26/2012 CONSULTATION DATE:  2/5  REFERRING MD :  Karilyn Cota  CHIEF COMPLAINT:  Acute resp failure   BRIEF PATIENT DESCRIPTION:  43 YOM admitted to [facility] for Cdiff on 2/2. Underwent decompressive cecostomy 2/3, initially was better, then worsened on 2/4 with increased abdominal distension and tenderness, decreased renal function.  He was taken back to the OR on 2/4 for total abdominal colectomy and end ileostomy.  Post op he developed ARDS and septic shock and was being treated for this.  The morning of 2/6 he had new onset of A-fib and transferred to [facility].   Course complicated by severe hypoalbuminemia, elevated LFTs  -cholestatic without biliary obstruction & pre-renal failure. Hypotensive 3/2 am requiring albumin  SIGNIFICANT EVENTS / STUDIES:  2/2 Admit to [facility] with C diff, toxic megacolon 2/3 Decompressive Cecostomy 2/4 Total abdominal colectomy and end ileostomy 2/6 New A-fib and transfer to [facility] health. 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 2/10 CT abd/pelvis: small hematoma likely subcapsular spleen, JPs wnl 2/11 trial TFs  2/11 UE venous dopplers: Findings consistent with superficial vein thrombosis involving the right basilic vein 2/14 LE venous dopplers: No evidence of deep vein thrombosis 2/14 LUE venous doppler: No evidence of deep vein or superficial thrombosis involving the left upper extremity  2/15 neg 4 l bal on lasix drip, lasix d/c  2/16 poor neuro status, CT head: [facility] 2/17 CRRT initiated, high pressor needs 2/17 RUQ [place]: Gallbladder wall thickening in the absence of sludge or stones but no biliary ductal dilatation 2/18 reduced vasopressor reqts 2/19 New onset thrombocytopenia. Heparin D/C'd. Argatroban  initiated 2/19 [place] abd (repeat): No explanation for elevated liver function tests 2/20 reduction pressors, improved alertness, extubated 2/20 LE venous dopplers: No evidence of deep vein or superficial thrombosis 2/21 bleeding overnight from JP's, argatroban turned off, lower pressor needs 2/22 off pressors, on RA, converted to NSR 2/22 Carotid dopplers: No significant extracranial carotid artery stenosis demonstrated 2/25 Cystoscopy with bilateral retrograde pyelograms. No extravasation of contrast from either ureteral orifice. No evidence of hydroureteronephrosis. Unremarkable urinary bladder. B ureteral stents placed 2/27 MRI brain: Scattered small acute to subacute infarcts in both cerebral hemispheres, brainstem, and right cerebellum. Favor sequelae of emboli from a cardiac or proximal aortic source. Main alternate consideration is hypotensive episode 2/27 MRCP: No intrahepatic bile duct dilatation or common bile duct dilatation. No gallstones identified. Large subcapsular fluid collection encases the spleen. This is of indeterminate etiology. This may represent a subacute to chronic subcapsular hematoma or abscess. 2/28 SLP eval: FEES performed. Oropharyngeal weakness noted. Recommend replacement of NGT to supplement PO intake 3/1 Renal signed off 3/3- bowel evisceration, to OR 3/3- no perf / abscess, wound vac open could not close, Liver BX, some oozing, post op resp failure  LINES / TUBES: ETT ([facility]) 2/4>>> 2/20 PICC ([facility]) 2/4>>>2/11 Art Line ([facility]) 2/5>>>out L IJ CVL 2/06 >> out A line R fem 2/18>>>2/24 Rt ij HD 2/17 >> out Left IJ 2/18 >>   CULTURES: BCx2 2/4>>>negative BCx2 2/3>>>negative UC 2/3- Negative MRSA PCR 2/3- Negative C diff 2/6>>>Positive Body fluid 2/9>>>WBCs, no organisms 2/15 BC x 2 >> NEG 2/15 Sputum >>>NF 3/2 bc>>> 3/2 urine>>> 3/2 sputum>>>  ANTIBIOTICS: Flagyl 2/3>> d/c'd Vanc 2/6 (IV) >>>2/7 azactam 2/6>>>2/7  Oral vanc 2/6>>>off IV vanc 2/16  >2/24 Aztreonam 2/16 >2/17 Imipenem 2/17>>>2/24 mycofungin 2/18>>>2/24  cipro 3/3>>> azactam 3/2>>>3/3 vanc 3/2>>> Flagyl 3/3>>>  SUBJECTIVE:  Required intermittent neo by Anesthesia  VITAL SIGNS: Temp:  [97.3 F (36.3 C)-97.9 F (36.6 C)] 97.9 F (36.6 C) (03/03 1217) Pulse Rate:  [84-107] 100 (03/03 1000) Resp:  [12-23] 14 (03/03 1000) BP: (82-107)/(50-61) 99/51 mmHg (03/03 1000) SpO2:  [92 %-99 %] 95 % (03/03 1000) Weight:  [80.4 kg (177 lb 4 oz)] 80.4 kg (177 lb 4 oz) (03/03 0400)    INTAKE / OUTPUT: Intake/Output     03/02 0701 - 03/03 0700 03/03 0701 - 03/04 0700   P.O.     I.V. (mL/kg) 2370 (29.5) 50 (0.6)   NG/GT 390 30   IV Piggyback 1700 100   Total Intake(mL/kg) 4460 (55.5) 180 (2.2)   Urine (mL/kg/hr) 1775 (0.9) 150 (0.3)   Drains 1890 (1) 100 (0.2)   Stool 100 (0.1)    Total Output 3765 250   Net +695 -70          PHYSICAL EXAMINATION: General Icteric, ETT Neuro:Post op rass -4 HEENT: Sclericterus Cardiovascular: RRR  s1 s2 Lungs: reduced bases Abdomen: soft, NT, + BS, bowel noted outside of vac Musculoskeletal: 2+ LE edema  Skin: rash improved  LABS: BMET    Component Value Date/Time   NA 146* 03/26/2012 0430   K 3.8 03/26/2012 0430   CL 115* 03/26/2012 0430   CO2 19 03/26/2012 0430   GLUCOSE 139* 03/26/2012 0430   BUN 98* 03/26/2012 0430   CREATININE 2.11* 03/26/2012 0430   CALCIUM 7.8* 03/26/2012 0430   GFRNONAA 34* 03/26/2012 0430   GFRAA 40* 03/26/2012 0430    Hepatic Function Panel     Component Value Date/Time   PROT 3.7* 03/25/2012 1655   ALBUMIN 1.5* 03/25/2012 1655   AST 235* 03/25/2012 1655   ALT 236* 03/25/2012 1655   ALKPHOS 921* 03/25/2012 1655   BILITOT 17.4* 03/25/2012 1655   BILIDIR 14.2* 03/24/2012 0355   IBILI 4.4* 03/24/2012 0355    CBC    Component Value Date/Time   WBC 7.9 03/26/2012 1000   RBC 2.28* 03/26/2012 1000   HGB 7.2* 03/26/2012 1000   HCT 24.1* 03/26/2012 1000   PLT 56* 03/26/2012 1000   MCV 105.7* 03/26/2012 1000   MCH 31.6  03/26/2012 1000   MCHC 29.9* 03/26/2012 1000   RDW 21.0* 03/26/2012 1000   LYMPHSABS 0.2* 03/26/2012 1000   MONOABS 0.1 03/26/2012 1000   EOSABS 0.0 03/26/2012 1000   BASOSABS 0.0 03/26/2012 1000      Recent Labs Lab 03/25/12 1533 03/25/12 1931 03/25/12 2342 03/26/12 0400 03/26/12 0738  GLUCAP 105* 120* 112* 123* 143*    Dg Abd 1 View  03/25/2012  *RADIOLOGY REPORT*  Clinical Data: Confirm NG tube placement.  ABDOMEN - 1 VIEW  Comparison: MRCP - 03/22/2012  Findings:  NG tube tip and side port project over the left upper abdominal quadrant, overlying the expected location of the mid body of the stomach. Surgical drains overlying the peripheral aspect of the upper abdomen bilaterally.  There is a paucity of bowel gas without definite evidence of obstruction.  Prominence of the splenic shadow compatible with known perisplenic fluid collection.  Limited visualization of the lower thorax suggests left basilar opacities, likely atelectasis. No acute osseous abnormalities.  IMPRESSION: 1.  NG tube tip and side port project over the expected location of the mid body of the stomach.  2. Prominence of the splenic shadow compatible with a known perisplenic fluid collection.   Original Report Authenticated By: Tacey Ruiz, MD    Dg Chest Port 1 View  03/26/2012  *RADIOLOGY REPORT*  Clinical Data: Shortness of breath.  PORTABLE CHEST - 1 VIEW  Comparison: 03/24/2012  Findings: Central line positioning stable with the tip in the SVC. Interval nasogastric tube placement with the tube entering the stomach. Lungs show increase in atelectasis at the right lung base and persistent atelectasis/consolidation of the left lower lobe with probable associated moderate-sized left pleural effusion.  No overt edema is identified.  Heart size is stable.  IMPRESSION: Increase in atelectasis at the right lung base.  Persistent atelectasis/consolidation of the left lower lobe with probable left pleural effusion.   Original Report  Authenticated By: Irish Lack, M.D.      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    Thrombocytopenia, unspecified, HIT panel negative    Coagulopathy - likely due to hepatic dysfunction  Anemia, mild. No evidence of active blood loss. No indication for PRBCs   Small splenic hematoma? -cbc repeat for drop in hgb -scd -post op low threshold re add sub q heparin -liver bx  Pulm    Pleural effusion, L > R Post op resp failure -allow pos balance for now, albumin -place to TV 600, 100%, peep 5 - ABG in 30 min  -assess pcxr post op ett -With oozing and clinical course, BP, will keep on vent overnight  Renal   ARF (acute renal failure), Cr leveling off -?now CKD   Hypernatremia, improving -maintain bicarb -chem now post op  Endocrine    Relative adrenal insufficiency - hydrocortisone increase 3-2 for hypotension  roids to q6h  ID Resolved cdiff with MODS Bowel evisceration -continue vanc, cipro, flagyl -follow any OR cultures  Resolved Problems:    history of UC (ulcerative colitis)   Atrial fibrillation, resolved   Acute respiratory failure, resolved   Septic shock, resolved    RUE DVT related to PICC, resolved   Hypokalemia, resolved   Hyperglycemia, resolved   Acute encephalopathy, resolved   Ccm time 30 min post op  Will update wife in full on plans  Mcarthur Rossetti. Tyson Alias, MD, FACP Pgr: [phone]  Pulmonary & Critical Care

## 2012-03-26 NOTE — Progress Notes (Signed)
OT Cancellation Note  Patient Details Name: Connor Small MRN: [id number] DOB: 18-Mar-1960   Cancelled Treatment:    Reason Eval/Treat Not Completed: Medical issues which prohibited therapy;Patient at procedure or test/ unavailable (surgery)  [place], OTR/L  [phone] 03/26/2012 03/26/2012, 12:13 PM

## 2012-03-26 NOTE — Progress Notes (Signed)
Clinical Social Worker (CSW) observed that pt has transferred to 2100. CSW provided a handoff to unit CSW who will begin following. This CSW signing off.  Hunt Oris, MSW, [place]

## 2012-03-26 NOTE — Transfer of Care (Signed)
Immediate Anesthesia Transfer of Care Note  Patient: Connor Small  Procedure(s) Performed: Procedure(s): EXPLORATORY LAPAROTOMY (N/A) APPLICATION OF WOUND VAC (N/A) LIVER BIOPSY (N/A)  Patient Location: PACU and ICU  Anesthesia Type:General  Level of Consciousness: sedated and Patient remains intubated per anesthesia plan  Airway & Oxygen Therapy: Patient remains intubated per anesthesia plan  Post-op Assessment: Report given to PACU RN  Post vital signs: Reviewed and stable  Complications: No apparent anesthesia complications

## 2012-03-26 NOTE — Progress Notes (Signed)
NUTRITION FOLLOW UP  Intervention:    If unable to begin enteral nutrition in the next 24 hours, recommend TPN for full nutrition support.  When gut function allows, start TF via NG tube with Vital AF 1.2 at 10 ml/h, increase by 10 ml every 4 hours to goal rate of 85 ml/h to provide 2448 kcals, 153 gm protein, 1654 ml free water daily.  Nutrition Dx:   Inadequate oral intake related to swallowing difficulty as evidenced by NPO status, ongoing.  Goal: Intake to meet >90% of estimated nutrition needs, unmet.  Monitor:   Ability to begin TF, weight trend, labs, wound healing.  Assessment: Calorie count was to be completed over the weekend.  Complete wound evisceration with visible bowel dehiscence found when VAC removed this morning.  Patient is currently NPO; returning to OR now.  Received consult for TF initiation and management, however, with new bowel dehiscence and planned surgery, will not be able to start TF just yet.    Weight continues to trend down.  Patient has not had adequate nutrition since TF was stopped on 2/26.    Height: Ht Readings from Last 1 Encounters:  03/25/12 5' 9"  (1.753 m)    Weight Status:   Wt Readings from Last 1 Encounters:  03/26/12 177 lb 4 oz (80.4 kg)  03/22/12  186 lb 8.2 oz (84.6 kg)  03/21/12  179 lb 7.3 oz (81.4 kg)  03/16/12  192 lb 0.3 oz (87.1 kg)  Admit wt 200 lb  Weight continues to trend down.   Body mass index is 26.16 kg/(m^2). Overweight.  Re-estimated needs:  Kcal: 2300 - 2500  Protein: 140 - 160 gm  Fluid: >/= 2.3 L  Skin: bowel dehiscence/wound evisceration  Diet Order: NPO for bowel surgery this morning.    Intake/Output Summary (Last 24 hours) at 03/26/12 1034 Last data filed at 03/26/12 0905  Gross per 24 hour  Intake   4300 ml  Output   3595 ml  Net    705 ml    Last BM: 3/2  Labs:   Recent Labs Lab 03/20/12 0447  03/23/12 0445 03/24/12 0355 03/25/12 1655 03/26/12 0430  NA 152*  < > 142 142 145  146*  K 4.2  < > 4.3 4.4 4.9 3.8  CL 123*  < > 113* 112 115* 115*  CO2 18*  < > 20 20 18* 19  BUN 116*  < > 96* 93* 97* 98*  CREATININE 2.06*  < > 1.97* 2.00* 2.16* 2.11*  CALCIUM 7.9*  < > 7.7* 7.7* 7.6* 7.8*  MG 2.5  --   --   --  2.3 2.2  PHOS 3.6  < > 4.1 4.9*  --  5.5*  GLUCOSE 202*  < > 73 65* 114* 139*  < > = values in this interval not displayed.  CBG (last 3)   Recent Labs  03/25/12 2342 03/26/12 0400 03/26/12 0738  GLUCAP 112* 123* 143*    Scheduled Meds: . albumin human  25 g Intravenous Q6H  . antiseptic oral rinse  15 mL Mouth Rinse q12n4p  . chlorhexidine  15 mL Mouth Rinse BID  . ciprofloxacin  400 mg Intravenous Q12H  . feeding supplement  1 Container Oral TID PC & HS  . hydrocortisone sodium succinate  50 mg Intravenous Q6H  . insulin aspart  0-9 Units Subcutaneous Q4H  . metronidazole  500 mg Intravenous Q8H  . nystatin-triamcinolone   Topical BID  . sodium chloride  10-40 mL Intracatheter Q12H  . vancomycin  1,000 mg Intravenous Q24H    Continuous Infusions: . sodium chloride 20 mL/hr at 03/26/12 0300  . dextrose Stopped (03/25/12 1622)  . feeding supplement (NEPRO CARB STEADY) 1,000 mL (03/25/12 1616)  . norepinephrine (LEVOPHED) Adult infusion    .  sodium bicarbonate  infusion 1000 mL       Molli Barrows, RD, LDN, CNSC Pager# [phone] After Hours Pager# [phone]

## 2012-03-27 ENCOUNTER — Inpatient Hospital Stay (HOSPITAL_COMMUNITY): Payer: BC Managed Care – PPO

## 2012-03-27 ENCOUNTER — Encounter (HOSPITAL_COMMUNITY): Payer: Self-pay | Admitting: General Surgery

## 2012-03-27 LAB — PREPARE FRESH FROZEN PLASMA: Unit division: 0

## 2012-03-27 LAB — CBC WITH DIFFERENTIAL/PLATELET
Basophils Absolute: 0 10*3/uL (ref 0.0–0.1)
Basophils Relative: 0 % (ref 0–1)
Eosinophils Absolute: 0 10*3/uL (ref 0.0–0.7)
Eosinophils Relative: 0 % (ref 0–5)
Hemoglobin: 6.3 g/dL — CL (ref 13.0–17.0)
Lymphocytes Relative: 5 % — ABNORMAL LOW (ref 12–46)
Lymphocytes Relative: 4 % — ABNORMAL LOW (ref 12–46)
MCH: 30.1 pg (ref 26.0–34.0)
MCH: 29.7 pg (ref 26.0–34.0)
MCHC: 33.9 g/dL (ref 30.0–36.0)
Monocytes Absolute: 0.2 10*3/uL (ref 0.1–1.0)
Monocytes Absolute: 0.3 10*3/uL (ref 0.1–1.0)
Neutro Abs: 8.2 10*3/uL — ABNORMAL HIGH (ref 1.7–7.7)
Neutrophils Relative %: 93 % — ABNORMAL HIGH (ref 43–77)
Neutrophils Relative %: 93 % — ABNORMAL HIGH (ref 43–77)
RBC: 2.09 MIL/uL — ABNORMAL LOW (ref 4.22–5.81)
RDW: 25.7 % — ABNORMAL HIGH (ref 11.5–15.5)

## 2012-03-27 LAB — GLUCOSE, CAPILLARY
Glucose-Capillary: 109 mg/dL — ABNORMAL HIGH (ref 70–99)
Glucose-Capillary: 117 mg/dL — ABNORMAL HIGH (ref 70–99)
Glucose-Capillary: 113 mg/dL — ABNORMAL HIGH (ref 70–99)
Glucose-Capillary: 104 mg/dL — ABNORMAL HIGH (ref 70–99)

## 2012-03-27 LAB — URINE CULTURE: Colony Count: NO GROWTH

## 2012-03-27 LAB — COMPREHENSIVE METABOLIC PANEL
ALT: 149 U/L — ABNORMAL HIGH (ref 0–53)
Alkaline Phosphatase: 491 U/L — ABNORMAL HIGH (ref 39–117)
CO2: 21 mEq/L (ref 19–32)
Chloride: 116 mEq/L — ABNORMAL HIGH (ref 96–112)
GFR calc Af Amer: 41 mL/min — ABNORMAL LOW (ref >90)
GFR calc non Af Amer: 36 mL/min — ABNORMAL LOW (ref >90)
Glucose, Bld: 108 mg/dL — ABNORMAL HIGH (ref 70–99)
Potassium: 3.4 mEq/L — ABNORMAL LOW (ref 3.5–5.1)
Sodium: 149 mEq/L — ABNORMAL HIGH (ref 135–145)
Total Bilirubin: 13.6 mg/dL — ABNORMAL HIGH (ref 0.3–1.2)

## 2012-03-27 LAB — BASIC METABOLIC PANEL
BUN: 88 mg/dL — ABNORMAL HIGH (ref 6–23)
Creatinine, Ser: 1.95 mg/dL — ABNORMAL HIGH (ref 0.50–1.35)
GFR calc Af Amer: 43 mL/min — ABNORMAL LOW (ref >90)
GFR calc non Af Amer: 37 mL/min — ABNORMAL LOW (ref >90)
Glucose, Bld: 108 mg/dL — ABNORMAL HIGH (ref 70–99)

## 2012-03-27 MED ORDER — VITAL AF 1.2 CAL PO LIQD
1000.0000 mL | ORAL | Status: DC
Start: 2012-03-27 — End: 2012-03-27
  Administered 2012-03-27: 1000 mL
  Filled 2012-03-27 (×3): qty 1000

## 2012-03-27 MED ORDER — FREE WATER
200.0000 mL | Freq: Three times a day (TID) | Status: DC
  Administered 2012-03-27 – 2012-03-28 (×6): 200 mL

## 2012-03-27 MED ORDER — ALBUMIN HUMAN 25 % IV SOLN
25.0000 g | Freq: Four times a day (QID) | INTRAVENOUS | Status: AC
  Administered 2012-03-27: 12.5 g via INTRAVENOUS
  Administered 2012-03-27: 25 g via INTRAVENOUS
  Administered 2012-03-27: 12.5 g via INTRAVENOUS
  Administered 2012-03-28: 25 g via INTRAVENOUS
  Filled 2012-03-27 (×4): qty 100

## 2012-03-27 MED ORDER — POTASSIUM CHLORIDE 10 MEQ/50ML IV SOLN
10.0000 meq | INTRAVENOUS | Status: AC
  Administered 2012-03-27 (×2): 10 meq via INTRAVENOUS
  Filled 2012-03-27: qty 100

## 2012-03-27 MED ORDER — JEVITY 1.2 CAL PO LIQD
1000.0000 mL | ORAL | Status: DC
  Filled 2012-03-27 (×2): qty 1000

## 2012-03-27 MED ORDER — JEVITY 1.2 CAL PO LIQD
1000.0000 mL | ORAL | Status: DC
  Administered 2012-03-27: 10:00:00
  Filled 2012-03-27 (×2): qty 1000

## 2012-03-27 MED ORDER — VITAL AF 1.2 CAL PO LIQD
1000.0000 mL | ORAL | Status: DC
  Administered 2012-03-29: 1000 mL
  Filled 2012-03-27 (×3): qty 1000

## 2012-03-27 MED ORDER — DEXTROSE 5 % IV SOLN
INTRAVENOUS | Status: DC
  Administered 2012-03-27: 09:00:00 via INTRAVENOUS
  Administered 2012-03-28: 50 mL via INTRAVENOUS

## 2012-03-27 MED ORDER — FAMOTIDINE 40 MG/5ML PO SUSR
20.0000 mg | Freq: Every day | ORAL | Status: DC
  Administered 2012-03-27 – 2012-04-02 (×7): 20 mg
  Filled 2012-03-27 (×9): qty 2.5

## 2012-03-27 NOTE — Progress Notes (Signed)
Clinical Social Worker staffed case with MD, RN, and RNCM during board rounds.  CSW met with pt and pt's wife at bedside.  CSW provided emotional support.  CSW to continue to follow and assist as needed.   Dala Dock, MSW, [place]

## 2012-03-27 NOTE — Progress Notes (Signed)
Chaplain Note:  Chaplain visited with pt and pt's spouse.  Pt was awake, alert, reclining in bed, having been recently extubated.  Pt's wife was at bedside.  Chaplain provided spiritual comfort, support, and prayer for pt and pt's wife.  Pt and pt's wife expressed appreciation for chaplain support.  Chaplain will follow up as needed.  03/27/12 1300  Clinical Encounter Type  Visited With Patient and family together  Visit Type Spiritual support;Follow-up  Referral From Nurse  Spiritual Encounters  Spiritual Needs Emotional;Prayer  Stress Factors  Patient Stress Factors Health changes;Major life changes  Family Stress Factors Major life changes;[place], [place]

## 2012-03-27 NOTE — Progress Notes (Signed)
PT Cancellation Note  Patient Details Name: ELMAN DETTMAN MRN: [id number] DOB: [birth year]   Cancelled Treatment:    Reason Eval/Treat Not Completed: Medical issues which prohibited therapy involving abdominal wound. 03/27/2012  Donnella Sham, [place] [phone] (pager)   [place], Tessie Fass 03/27/2012, 11:31 AM

## 2012-03-27 NOTE — Progress Notes (Signed)
PULMONARY  / CRITICAL CARE MEDICINE  Name: Connor Small MRN: [id number] DOB: 1960-02-04    ADMISSION DATE:  02/26/2012 CONSULTATION DATE:  2/5  REFERRING MD :  Karilyn Cota  CHIEF COMPLAINT:  Acute resp failure   BRIEF PATIENT DESCRIPTION:  63 YOM admitted to [facility] for Cdiff on 2/2. Underwent decompressive cecostomy 2/3, initially was better, then worsened on 2/4 with increased abdominal distension and tenderness, decreased renal function.  He was taken back to the OR on 2/4 for total abdominal colectomy and end ileostomy.  Post op he developed ARDS and septic shock and was being treated for this.  The morning of 2/6 he had new onset of A-fib and transferred to [facility].   Course complicated by severe hypoalbuminemia, elevated LFTs  -cholestatic without biliary obstruction & pre-renal failure. Hypotensive 3/2 am requiring albumin  SIGNIFICANT EVENTS / STUDIES:  2/2 Admit to [facility] with C diff, toxic megacolon 2/3 Decompressive Cecostomy 2/4 Total abdominal colectomy and end ileostomy 2/6 New A-fib and transfer to [facility]. 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 2/10 CT abd/pelvis: small hematoma likely subcapsular spleen, JPs wnl 2/11 trial TFs  2/11 UE venous dopplers: Findings consistent with superficial vein thrombosis involving the right basilic vein 2/14 LE venous dopplers: No evidence of deep vein thrombosis 2/14 LUE venous doppler: No evidence of deep vein or superficial thrombosis involving the left upper extremity  2/15 neg 4 l bal on lasix drip, lasix d/c  2/16 poor neuro status, CT head: [facility] 2/17 CRRT initiated, high pressor needs 2/17 RUQ [place]: Gallbladder wall thickening in the absence of sludge or stones but no biliary ductal dilatation 2/18 reduced vasopressor reqts 2/19 New onset thrombocytopenia. Heparin D/C'd. Argatroban  initiated 2/19 [place] abd (repeat): No explanation for elevated liver function tests 2/20 reduction pressors, improved alertness, extubated 2/20 LE venous dopplers: No evidence of deep vein or superficial thrombosis 2/21 bleeding overnight from JP's, argatroban turned off, lower pressor needs 2/22 off pressors, on RA, converted to NSR 2/22 Carotid dopplers: No significant extracranial carotid artery stenosis demonstrated 2/25 Cystoscopy with bilateral retrograde pyelograms. No extravasation of contrast from either ureteral orifice. No evidence of hydroureteronephrosis. Unremarkable urinary bladder. B ureteral stents placed 2/27 MRI brain: Scattered small acute to subacute infarcts in both cerebral hemispheres, brainstem, and right cerebellum. Favor sequelae of emboli from a cardiac or proximal aortic source. Main alternate consideration is hypotensive episode 2/27 MRCP: No intrahepatic bile duct dilatation or common bile duct dilatation. No gallstones identified. Large subcapsular fluid collection encases the spleen. This is of indeterminate etiology. This may represent a subacute to chronic subcapsular hematoma or abscess. 2/28 SLP eval: FEES performed. Oropharyngeal weakness noted. Recommend replacement of NGT to supplement PO intake 3/1 Renal signed off 3/3- bowel evisceration, to OR 3/3- no perf / abscess, wound vac open could not close, Liver BX, some oozing, post op resp failure 3/3 echo repeat - improved ef55%, poor windows 3/4- low dose pressor needs   LINES / TUBES: ETT ([facility]) 2/4>>> 2/20 PICC ([facility]) 2/4>>>2/11 Art Line ([facility]) 2/5>>>out L IJ CVL 2/06 >> out A line R fem 2/18>>>2/24 Rt ij HD 2/17 >> out Left IJ 2/18 >>  ETT 3/3>>>  CULTURES: BCx2 2/4>>>negative BCx2 2/3>>>negative UC 2/3- Negative MRSA PCR 2/3- Negative C diff 2/6>>>Positive Body fluid 2/9>>>WBCs, no organisms 2/15 BC x 2 >> NEG 2/15 Sputum >>>NF 3/2  bc>>> 3/2 urine>>> 3/2 sputum>>>  PATH: 3/3 liver  bx>>>  ANTIBIOTICS: Flagyl 2/3>> d/c'd Vanc 2/6 (IV) >>>2/7 azactam 2/6>>>2/7 Oral vanc 2/6>>>off IV vanc 2/16 >2/24 Aztreonam 2/16 >2/17 Imipenem 2/17>>>2/24 mycofungin 2/18>>>2/24  cipro 3/3>>> azactam 3/2>>>3/3 vanc 3/2>>> Flagyl 3/3>>>  SUBJECTIVE:  Required intermittent neo by Anesthesia  VITAL SIGNS: Temp:  [97.3 F (36.3 C)-98.6 F (37 C)] 97.4 F (36.3 C) (03/04 0415) Pulse Rate:  [79-107] 80 (03/04 0600) Resp:  [11-24] 12 (03/04 0600) BP: (74-119)/(45-75) 115/66 mmHg (03/04 0600) SpO2:  [95 %-100 %] 100 % (03/04 0600) Arterial Line BP: (100-101)/(64) 100/64 mmHg (03/03 1400) FiO2 (%):  [40 %-60 %] 40 % (03/04 0307) Weight:  [81 kg (178 lb 9.2 oz)] 81 kg (178 lb 9.2 oz) (03/04 0450)   INTAKE / OUTPUT: Intake/Output     03/03 0701 - 03/04 0700 03/04 0701 - 03/05 0700   I.V. (mL/kg) 1802.1 (22.2)    Blood 575    NG/GT 30    IV Piggyback 1000    Total Intake(mL/kg) 3407.1 (42.1)    Urine (mL/kg/hr) 1425 (0.7)    Drains 1600 (0.8)    Stool     Total Output 3025     Net +382.1          Emesis Occurrence 600 x      PHYSICAL EXAMINATION: General Icteric, ETT Neuro:Post op rass 0 , follows commands HEENT: Sclericterus, jvd up Cardiovascular: RRR  s1 s2 Lungs: reduced bases, coarse bases Abdomen: soft, NT, NO BS, no r Musculoskeletal: 3+ LE edema  Skin: rash improved  LABS: BMET    Component Value Date/Time   NA 149* 03/27/2012 0400   K 3.4* 03/27/2012 0400   CL 116* 03/27/2012 0400   CO2 21 03/27/2012 0400   GLUCOSE 108* 03/27/2012 0400   BUN 96* 03/27/2012 0400   CREATININE 2.04* 03/27/2012 0400   CALCIUM 7.9* 03/27/2012 0400   GFRNONAA 36* 03/27/2012 0400   GFRAA 41* 03/27/2012 0400    Hepatic Function Panel     Component Value Date/Time   PROT 4.1* 03/27/2012 0400   ALBUMIN 2.5* 03/27/2012 0400   AST 133* 03/27/2012 0400   ALT 149* 03/27/2012 0400   ALKPHOS 491* 03/27/2012 0400   BILITOT 13.6* 03/27/2012 0400   BILIDIR 14.2* 03/24/2012 0355   IBILI 4.4*  03/24/2012 0355    CBC    Component Value Date/Time   WBC 9.1 03/27/2012 0400   RBC 2.09* 03/27/2012 0400   HGB 6.3* 03/27/2012 0400   HCT 20.1* 03/27/2012 0400   PLT 55* 03/27/2012 0400   MCV 96.2 03/27/2012 0400   MCH 30.1 03/27/2012 0400   MCHC 31.3 03/27/2012 0400   RDW 25.8* 03/27/2012 0400   LYMPHSABS 0.5* 03/27/2012 0400   MONOABS 0.2 03/27/2012 0400   EOSABS 0.0 03/27/2012 0400   BASOSABS 0.0 03/27/2012 0400      Recent Labs Lab 03/26/12 1345 03/26/12 1530 03/26/12 2009 03/27/12 0005 03/27/12 0412  GLUCAP 131* 141* 150* 151* 109*    Dg Abd 1 View  03/25/2012  *RADIOLOGY REPORT*  Clinical Data: Confirm NG tube placement.  ABDOMEN - 1 VIEW  Comparison: MRCP - 03/22/2012  Findings:  NG tube tip and side port project over the left upper abdominal quadrant, overlying the expected location of the mid body of the stomach. Surgical drains overlying the peripheral aspect of the upper abdomen bilaterally.  There is a paucity of bowel gas without definite evidence of obstruction.  Prominence of the splenic  shadow compatible with known perisplenic fluid collection.  Limited visualization of the lower thorax suggests left basilar opacities, likely atelectasis. No acute osseous abnormalities.  IMPRESSION: 1.  NG tube tip and side port project over the expected location of the mid body of the stomach.  2. Prominence of the splenic shadow compatible with a known perisplenic fluid collection.   Original Report Authenticated By: Tacey Ruiz, MD    Dg Chest Port 1 View  03/27/2012  *RADIOLOGY REPORT*  Clinical Data: Assess ET tube and left-sided effusion  PORTABLE CHEST - 1 VIEW  Comparison: 03/26/2012  Findings: Endotracheal tube terminates 4 cm above the carina.  Stable left IJ venous catheter.  Enteric tube courses below the diaphragm.  Moderate left and small right pleural effusions, grossly unchanged. Associated bibasilar opacities, likely atelectasis.  No frank interstitial edema.  No pneumothorax.  The heart is  normal in size.  IMPRESSION: Endotracheal tube terminates 4 cm above the carina.  Moderate left and small right pleural effusions, grossly unchanged.   Original Report Authenticated By: Charline Bills, M.D.    Dg Chest Port 1 View  03/26/2012  *RADIOLOGY REPORT*  Clinical Data: Endotracheal tube check  PORTABLE CHEST - 1 VIEW  Comparison: 03/26/2012  Findings: Endotracheal tube in good position.  Left jugular catheter tip in the SVC, unchanged.  NG tube in the stomach.  Bilateral pleural effusions, left greater than right are similar. There is left lower lobe consolidation due to effusion.  IMPRESSION: Endotracheal tube in good position.  Moderately large bilateral effusions and dependent atelectasis in both lung bases.   Original Report Authenticated By: Janeece Riggers, M.D.    Dg Chest Port 1 View  03/26/2012  *RADIOLOGY REPORT*  Clinical Data: Shortness of breath.  PORTABLE CHEST - 1 VIEW  Comparison: 03/24/2012  Findings: Central line positioning stable with the tip in the SVC. Interval nasogastric tube placement with the tube entering the stomach. Lungs show increase in atelectasis at the right lung base and persistent atelectasis/consolidation of the left lower lobe with probable associated moderate-sized left pleural effusion.  No overt edema is identified.  Heart size is stable.  IMPRESSION: Increase in atelectasis at the right lung base.  Persistent atelectasis/consolidation of the left lower lobe with probable left pleural effusion.   Original Report Authenticated By: Irish Lack, M.D.      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 - resolved in repeat echo 3/3 -Echo repeat reviewed -levophed top map goals -stress steroids to remain -avoid a line if able -cvp, continue albumin  Neuro   Multi-territorial CVA  - embolic vs hypotensive Sedation for vent 3/3 -int fent as goal -WUA -upright -avoid benzo    Severe muscle deconditioning   Shoulder subluxation, right - Cont PT/OT discuss R shoulder subluxation with Ortho -OT recs, will re initiate post vent  GI Wound evisceration    Hyperbilirubinemia, due to cholestasis - Improved mild  Dysphagia due to oropharyngeal weakness -Will follow liver bx -vac change plan thur -restart TF trickle and no increase, avoid tpn    Thrombocytopenia, unspecified, HIT panel negative    Coagulopathy - likely due to hepatic dysfunction  Anemia, mild. MIld OR loss   Small splenic hematoma? -cbc after Tx 1 unit prbc -scd -cbc in am   Pulm    Pleural effusion, L >  R Post op resp failure -wean this am cpap5 ps 5, goal 30 min  -assess rsbi, mechanics -MS supports extubation -thur back to OR can elective LMA? Will d/w surgery -pos balance ok, low O2 meeds with effusions -avoid trach if able  Renal   ARF (acute renal failure), Cr leveling off -?now CKD   Hypernatremia, improving -dc bicarb drip -add d5w -free water Chem in am   Endocrine    Relative adrenal insufficiency - hydrocortisone increase 3-2 for hypotension  roids to q6h, remain  ID Resolved cdiff with MODS Bowel evisceration -continue vanc, cipro, flagyl -follow any OR cultures  Resolved Problems:    history of UC (ulcerative colitis)   Atrial fibrillation, resolved   Acute respiratory failure, resolved   Septic shock, resolved   RUE DVT related to PICC, resolved   Hypokalemia, resolved   Hyperglycemia, resolved   Acute encephalopathy, resolved   Ccm time 30 min post op  Will update wife in full on plans  Mcarthur Rossetti. Tyson Alias, MD, FACP Pgr: [phone] [facility]

## 2012-03-27 NOTE — Procedures (Signed)
Extubation Procedure Note  Patient Details:   Name: Connor Small DOB: [birth year] MRN: [id number]   Airway Documentation:     Evaluation  O2 sats: stable throughout and currently acceptable Complications: No apparent complications Patient did tolerate procedure well. Bilateral Breath Sounds: Diminished Suctioning: Airway Yes  Positive cuff leak noted prior to extubation.  Post-extubation no stridor noted, pt able to state name.  Miquel Dunn 03/27/2012, 9:14 AM

## 2012-03-27 NOTE — Progress Notes (Signed)
SLP Cancellation Note  Patient Details Name: Connor Small MRN: [id number] DOB: 08/25/1960   Cancelled treatment:       Reason Eval/Treat Not Completed: Medical issues which prohibited therapy;Patient not medically ready. Pt on ventilator following procedure yesterday. SLP will continue to follow via chart for readiness to resume treatment.   [name], [place] CCC-SLP [phone] [name] 03/27/2012, 7:39 AM

## 2012-03-27 NOTE — Progress Notes (Signed)
Anemia post laparotomy for wound dehiscence   Transfuse 2 U PRBC; low threshold to repeat CT abdomen to evaluate for post op bleed; low threshold to repeat platelet transfusion.

## 2012-03-27 NOTE — Progress Notes (Signed)
OT Cancellation Note  Patient Details Name: Connor Small MRN: [id number] DOB: [birth year]   Cancelled Treatment:    Reason Eval/Treat Not Completed: Medical issues which prohibited therapy;Patient not medically ready  [facility], OTR/L  [phone] 03/27/2012 03/27/2012, 9:53 AM

## 2012-03-27 NOTE — Anesthesia Postprocedure Evaluation (Signed)
  Anesthesia Post-op Note  Patient: Connor Small  Procedure(s) Performed: Procedure(s): EXPLORATORY LAPAROTOMY (N/A) APPLICATION OF WOUND VAC (N/A) LIVER BIOPSY (N/A)  Patient Location: ICU  Anesthesia Type:General  Level of Consciousness: sedated and unresponsive  Airway and Oxygen Therapy: Patient remains intubated per anesthesia plan  Post-op Pain: none  Post-op Assessment: Post-op Vital signs reviewed, Patient's Cardiovascular Status Stable and Respiratory Function Stable  Post-op Vital Signs: Reviewed and stable  Complications: No apparent anesthesia complications

## 2012-03-27 NOTE — Progress Notes (Signed)
NUTRITION  CPNSULT / FOLLOW UP  Intervention:    Resume TF via NG tube with Vital AF 1.2 at 10 ml/h, increase by 10 ml every 4 hours to goal rate of 85 ml/h to provide 2448 kcals, 153 gm protein, 1654 ml free water daily.  Nutrition Dx:   Inadequate oral intake related to swallowing difficulty and altered GI function as evidenced by NPO status, ongoing.  Goal: Intake to meet >90% of estimated nutrition needs, unmet.  Monitor:   TF tolerance, weight trend, labs, wound healing.  Assessment: Received consult for TF initiation and management.  S/P exploratory laparotomy 3/3 for wound VAC placement, no perforation or abscess found; wound VAC unable to be closed.  Patient was extubated this morning.  NGT is in place for enteral nutrition.  TF to be held on 3/5 at midnight for return to OR on 3/6.     Height: Ht Readings from Last 1 Encounters:  03/25/12 5' 9"  (1.753 m)    Weight Status:   Wt Readings from Last 1 Encounters:  03/27/12 178 lb 9.2 oz (81 kg)  03/26/12  177 lb 4 oz (80.4 kg)  03/22/12  186 lb 8.2 oz (84.6 kg)  03/21/12  179 lb 7.3 oz (81.4 kg)  03/16/12  192 lb 0.3 oz (87.1 kg)  Admit wt 200 lb  Overall, weight is down from admission.  Body mass index is 26.36 kg/(m^2). Overweight.  Re-estimated needs:  Kcal: 2300 - 2500  Protein: 140 - 160 gm  Fluid: >/= 2.3 L  Skin: VAC to abdominal wound  Diet Order: NPO for bowel surgery this morning.    Intake/Output Summary (Last 24 hours) at 03/27/12 1146 Last data filed at 03/27/12 1100  Gross per 24 hour  Intake 4391.73 ml  Output   3275 ml  Net 1116.73 ml    Last BM: 3/2  Labs:   Recent Labs Lab 03/23/12 0445 03/24/12 0355 03/25/12 1655 03/26/12 0430 03/26/12 1400 03/27/12 0400  NA 142 142 145 146* 146* 149*  K 4.3 4.4 4.9 3.8 3.8 3.4*  CL 113* 112 115* 115* 113* 116*  CO2 20 20 18* 19 19 21   BUN 96* 93* 97* 98* 99* 96*  CREATININE 1.97* 2.00* 2.16* 2.11* 2.08* 2.04*  CALCIUM 7.7* 7.7* 7.6*  7.8* 7.8* 7.9*  MG  --   --  2.3 2.2  --   --   PHOS 4.1 4.9*  --  5.5*  --   --   GLUCOSE 73 65* 114* 139* 204* 108*    CBG (last 3)   Recent Labs  03/27/12 0005 03/27/12 0412 03/27/12 0809  GLUCAP 151* 109* 117*    Scheduled Meds: . albumin human  25 g Intravenous Q6H  . antiseptic oral rinse  15 mL Mouth Rinse q12n4p  . chlorhexidine  15 mL Mouth Rinse BID  . ciprofloxacin  400 mg Intravenous Q12H  . famotidine  20 mg Per Tube Daily  . feeding supplement  1 Container Oral TID PC & HS  . feeding supplement (JEVITY 1.2 CAL)  1,000 mL Per Tube Q24H  . free water  200 mL Per Tube Q8H  . hydrocortisone sodium succinate  50 mg Intravenous Q6H  . insulin aspart  0-9 Units Subcutaneous Q4H  . metronidazole  500 mg Intravenous Q8H  . nystatin-triamcinolone   Topical BID  . sodium chloride  10-40 mL Intracatheter Q12H  . vancomycin  1,000 mg Intravenous Q24H    Continuous Infusions: . dextrose 50  mL/hr at 03/27/12 0910  . norepinephrine (LEVOPHED) Adult infusion 2 mcg/min (03/27/12 3692)     Molli Barrows, RD, LDN, CNSC Pager# [phone] After Hours Pager# [phone]

## 2012-03-27 NOTE — Progress Notes (Signed)
1 Day Post-Op  Subjective:  1 - High JP Drain Output - s/p cysto and bilateral retrograde pyelograms 03/20/12 (NO STENTS) to rule out ureteral injury. No ureteral injury was found.  JP output remains high, UOP excellent at >1.5L/day.  Pt remains in critical condition now s/p re-exploration 3/3.  Objective: Vital signs in last 24 hours: Temp:  [97.3 F (36.3 C)-98.6 F (37 C)] 97.4 F (36.3 C) (03/04 0415) Pulse Rate:  [79-107] 80 (03/04 0600) Resp:  [11-24] 12 (03/04 0600) BP: (74-119)/(45-75) 115/66 mmHg (03/04 0600) SpO2:  [94 %-100 %] 100 % (03/04 0600) Arterial Line BP: (100-101)/(64) 100/64 mmHg (03/03 1400) FiO2 (%):  [40 %-60 %] 40 % (03/04 0307) Weight:  [81 kg (178 lb 9.2 oz)] 81 kg (178 lb 9.2 oz) (03/04 0450) Last BM Date: 03/25/12 (Ileostomy)  Intake/Output from previous day: 03/03 0701 - 03/04 0700 In: 3393.5 [I.V.:1788.5; Blood:575; NG/GT:30; IV Piggyback:1000] Out: 2525 [Urine:1425; Drains:1100] Intake/Output this shift:    General appearance: Intubated, Sedated. GCS 3T Head: Normocephalic, without obvious abnormality, atraumatic Eyes: conjunctivae/corneas clear. PERRL, EOM's intact. Fundi benign. Ears: normal TM's and external ear canals both ears Nose: Nares normal. Septum midline. Mucosa normal. No drainage or sinus tenderness. Throat: lips, mucosa, and tongue normal; teeth and gums normal Neck: no adenopathy, no carotid bruit, no JVD, supple, symmetrical, trachea midline and thyroid not enlarged, symmetric, no tenderness/mass/nodules Back: symmetric, no curvature. ROM normal. No CVA tenderness. Resp: diffuse coarse breath sounds on ventilator Chest wall: no tenderness Cardio: tachycardic GI: wound vac in place c/d/i. Male genitalia: normal, Foley in place with clear urine. Some penile edema c/w global mild anasarca. Extremities: edema throughout Pulses: 2+ and symmetric Skin: Skin color, texture, turgor normal. No rashes or lesions or icteric Lymph  nodes: Cervical, supraclavicular, and axillary nodes normal. Neurologic: Mental status: Alert, oriented, thought content appropriate, GCS 3T Incision/Wound:  Lab Results:   Recent Labs  03/26/12 1400 03/27/12 0400  WBC 12.3* 9.1  HGB 6.2* 6.3*  HCT 20.8* 20.1*  PLT 68* 55*   BMET  Recent Labs  03/26/12 1400 03/27/12 0400  NA 146* 149*  K 3.8 3.4*  CL 113* 116*  CO2 19 21  GLUCOSE 204* 108*  BUN 99* 96*  CREATININE 2.08* 2.04*  CALCIUM 7.8* 7.9*   PT/INR  Recent Labs  03/26/12 0915  LABPROT 26.8*  INR 2.63*   ABG  Recent Labs  03/26/12 1500  PHART 7.389  HCO3 17.1*    Studies/Results: Dg Abd 1 View  03/25/2012  *RADIOLOGY REPORT*  Clinical Data: Confirm NG tube placement.  ABDOMEN - 1 VIEW  Comparison: MRCP - 03/22/2012  Findings:  NG tube tip and side port project over the left upper abdominal quadrant, overlying the expected location of the mid body of the stomach. Surgical drains overlying the peripheral aspect of the upper abdomen bilaterally.  There is a paucity of bowel gas without definite evidence of obstruction.  Prominence of the splenic shadow compatible with known perisplenic fluid collection.  Limited visualization of the lower thorax suggests left basilar opacities, likely atelectasis. No acute osseous abnormalities.  IMPRESSION: 1.  NG tube tip and side port project over the expected location of the mid body of the stomach.  2. Prominence of the splenic shadow compatible with a known perisplenic fluid collection.   Original Report Authenticated By: Jake Seats, MD    Dg Chest Port 1 View  03/26/2012  *RADIOLOGY REPORT*  Clinical Data: Endotracheal tube check  PORTABLE CHEST -  1 VIEW  Comparison: 03/26/2012  Findings: Endotracheal tube in good position.  Left jugular catheter tip in the SVC, unchanged.  NG tube in the stomach.  Bilateral pleural effusions, left greater than right are similar. There is left lower lobe consolidation due to effusion.   IMPRESSION: Endotracheal tube in good position.  Moderately large bilateral effusions and dependent atelectasis in both lung bases.   Original Report Authenticated By: Carl Best, M.D.    Dg Chest Port 1 View  03/26/2012  *RADIOLOGY REPORT*  Clinical Data: Shortness of breath.  PORTABLE CHEST - 1 VIEW  Comparison: 03/24/2012  Findings: Central line positioning stable with the tip in the SVC. Interval nasogastric tube placement with the tube entering the stomach. Lungs show increase in atelectasis at the right lung base and persistent atelectasis/consolidation of the left lower lobe with probable associated moderate-sized left pleural effusion.  No overt edema is identified.  Heart size is stable.  IMPRESSION: Increase in atelectasis at the right lung base.  Persistent atelectasis/consolidation of the left lower lobe with probable left pleural effusion.   Original Report Authenticated By: Aletta Edouard, M.D.     Anti-infectives: Anti-infectives   Start     Dose/Rate Route Frequency Ordered Stop   03/26/12 2100  vancomycin (VANCOCIN) IVPB 1000 mg/200 mL premix     1,000 mg 200 mL/hr over 60 Minutes Intravenous Every 24 hours 03/25/12 1934     03/26/12 0945  ciprofloxacin (CIPRO) IVPB 400 mg     400 mg 200 mL/hr over 60 Minutes Intravenous Every 12 hours 03/26/12 0933     03/26/12 0600  aztreonam (AZACTAM) 1 g in dextrose 5 % 50 mL IVPB  Status:  Discontinued     1 g 100 mL/hr over 30 Minutes Intravenous 3 times per day 03/25/12 1934 03/26/12 0929   03/25/12 2200  metroNIDAZOLE (FLAGYL) IVPB 500 mg     500 mg 100 mL/hr over 60 Minutes Intravenous Every 8 hours 03/25/12 1909     03/25/12 2000  vancomycin (VANCOCIN) IVPB 1000 mg/200 mL premix     1,000 mg 200 mL/hr over 60 Minutes Intravenous STAT 03/25/12 1933 03/25/12 2054   03/25/12 2000  aztreonam (AZACTAM) 1 g in dextrose 5 % 50 mL IVPB     1 g 100 mL/hr over 30 Minutes Intravenous STAT 03/25/12 1933 03/25/12 2024   03/20/12 1145   ciprofloxacin (CIPRO) IVPB 400 mg    Comments:  ON CALL TO OR   400 mg 200 mL/hr over 60 Minutes Intravenous  Once 03/20/12 1143 03/20/12 1722   03/18/12 1000  vancomycin (VANCOCIN) 1,500 mg in sodium chloride 0.9 % 500 mL IVPB  Status:  Discontinued     1,500 mg 250 mL/hr over 120 Minutes Intravenous Every 48 hours 03/17/12 1920 03/19/12 0840   03/14/12 1000  micafungin (MYCAMINE) 100 mg in sodium chloride 0.9 % 100 mL IVPB  Status:  Discontinued     100 mg 100 mL/hr over 1 Hours Intravenous Daily 03/13/12 0632 03/19/12 0840   03/13/12 1000  micafungin (MYCAMINE) 100 mg in sodium chloride 0.9 % 100 mL IVPB     100 mg 100 mL/hr over 1 Hours Intravenous Daily 03/13/12 0632 03/13/12 1311   03/12/12 1800  vancomycin (VANCOCIN) IVPB 1000 mg/200 mL premix  Status:  Discontinued     1,000 mg 200 mL/hr over 60 Minutes Intravenous Every 24 hours 03/12/12 0859 03/17/12 1017   03/12/12 1000  imipenem-cilastatin (PRIMAXIN) 500 mg in sodium chloride 0.9 % 100  mL IVPB  Status:  Discontinued     500 mg 200 mL/hr over 30 Minutes Intravenous 3 times per day 03/12/12 0848 03/12/12 0909   03/12/12 1000  imipenem-cilastatin (PRIMAXIN) 250 mg in sodium chloride 0.9 % 100 mL IVPB  Status:  Discontinued     250 mg 200 mL/hr over 30 Minutes Intravenous 4 times per day 03/12/12 0909 03/19/12 0841   03/11/12 1200  vancomycin (VANCOCIN) 750 mg in sodium chloride 0.9 % 150 mL IVPB  Status:  Discontinued     750 mg 150 mL/hr over 60 Minutes Intravenous Every 12 hours 03/11/12 0204 03/12/12 0858   03/11/12 0215  vancomycin (VANCOCIN) 750 mg in sodium chloride 0.9 % 150 mL IVPB     750 mg 150 mL/hr over 60 Minutes Intravenous  Once 03/11/12 0204 03/11/12 0352   03/11/12 0215  aztreonam (AZACTAM) 1 g in dextrose 5 % 50 mL IVPB  Status:  Discontinued     1 g 100 mL/hr over 30 Minutes Intravenous 3 times per day 03/11/12 0204 03/12/12 0851   03/05/12 2200  metroNIDAZOLE (FLAGYL) IVPB 500 mg  Status:  Discontinued     Comments:  First dose ASAP   500 mg 100 mL/hr over 60 Minutes Intravenous Every 8 hours 03/05/12 1346 03/19/12 1509   03/02/12 1200  vancomycin (VANCOCIN) 50 mg/mL oral solution 500 mg  Status:  Discontinued     500 mg Oral 4 times per day 03/02/12 0913 03/19/12 1509   03/01/12 1445  vancomycin (VANCOCIN) 50 mg/mL oral solution 500 mg  Status:  Discontinued     500 mg Oral 3 times per day 03/01/12 1340 03/02/12 0913   03/01/12 1400  aztreonam (AZACTAM) 1 g in dextrose 5 % 50 mL IVPB  Status:  Discontinued     1 g 100 mL/hr over 30 Minutes Intravenous 3 times per day 03/01/12 1356 03/02/12 1146   03/01/12 1300  vancomycin (VANCOCIN) 1,250 mg in sodium chloride 0.9 % 250 mL IVPB  Status:  Discontinued     1,250 mg 166.7 mL/hr over 90 Minutes Intravenous Every 12 hours 03/01/12 1127 03/02/12 1146   02/29/12 0600  ciprofloxacin (CIPRO) IVPB 400 mg     400 mg 200 mL/hr over 60 Minutes Intravenous On call to O.R. 02/28/12 1409 02/28/12 1438   02/28/12 2200  vancomycin (VANCOCIN) 500 mg in sodium chloride irrigation 0.9 % 60 mL ENEMA  Status:  Discontinued     500 mg Rectal 2 times daily 02/28/12 1504 02/28/12 1817   02/28/12 1200  vancomycin (VANCOCIN) 50 mg/mL oral solution 500 mg  Status:  Discontinued    Comments:  First dose ASAP   500 mg Per Tube 4 times per day 02/28/12 0814 02/28/12 1817   02/28/12 1000  vancomycin (VANCOCIN) IVPB 1000 mg/200 mL premix  Status:  Discontinued     1,000 mg 200 mL/hr over 60 Minutes Intravenous Every 12 hours 02/28/12 0845 03/01/12 1127   02/28/12 1000  vancomycin (VANCOCIN) 500 mg in sodium chloride irrigation 0.9 % 100 mL ENEMA  Status:  Discontinued     500 mg Rectal 2 times daily 02/28/12 0923 02/28/12 1504   02/27/12 1600  metroNIDAZOLE (FLAGYL) IVPB 500 mg  Status:  Discontinued     500 mg 100 mL/hr over 60 Minutes Intravenous Every 8 hours 02/27/12 1546 02/27/12 2000   02/27/12 1600  ciprofloxacin (CIPRO) IVPB 400 mg  Status:  Discontinued      400 mg 200 mL/hr over 60  Minutes Intravenous Every 12 hours 02/27/12 1547 02/27/12 2000   02/27/12 1545  metroNIDAZOLE (FLAGYL) IVPB 500 mg  Status:  Discontinued     500 mg 100 mL/hr over 60 Minutes Intravenous  Once 02/27/12 1535 02/28/12 0843   02/27/12 1545  vancomycin (VANCOCIN) 500 mg in sodium chloride 0.9 % 100 mL IVPB  Status:  Discontinued     500 mg 100 mL/hr over 60 Minutes Intravenous  Once 02/27/12 1535 02/27/12 1538   02/27/12 1545  ciprofloxacin (CIPRO) IVPB 400 mg  Status:  Discontinued     400 mg 200 mL/hr over 60 Minutes Intravenous  Once 02/27/12 1538 02/28/12 0843   02/27/12 1415  vancomycin (VANCOCIN) 500 mg in sodium chloride 0.9 % 100 mL IVPB     500 mg 100 mL/hr over 60 Minutes Intravenous  Once 02/27/12 1402 02/27/12 1411   02/27/12 1400  vancomycin (VANCOCIN) powder 500 mg  Status:  Discontinued     500 mg Other To Surgery 02/27/12 1359 02/27/12 1402   02/27/12 1200  metroNIDAZOLE (FLAGYL) IVPB 500 mg  Status:  Discontinued    Comments:  First dose ASAP   500 mg 100 mL/hr over 60 Minutes Intravenous Every 6 hours 02/27/12 0953 03/05/12 1346   02/27/12 1200  vancomycin (VANCOCIN) 50 mg/mL oral solution 250 mg  Status:  Discontinued    Comments:  First dose ASAP   250 mg Oral 4 times per day 02/27/12 0953 02/28/12 0814   02/26/12 1700  oseltamivir (TAMIFLU) capsule 75 mg     75 mg Oral  Once 02/26/12 1651 02/26/12 1715      Assessment/Plan:  1 - High JP Drain Output - Likely due to hypoalbuminemia. Recent cysto and retrogrades rules out ureteral injury. Please call with any questions.  [facility], [place] 03/27/2012

## 2012-03-27 NOTE — Progress Notes (Signed)
Patient ID: Connor Small, male   DOB: [birth year], 52 y.o.   MRN: [id number] 1 Day Post-Op  Subjective: Pt remains intubated following surgery.  Plan for extubation today.  Plan for return to OR @ 72 hours.  No new overnight concerns by nursing.  Pt denies pain, currently on weaning trial.  Noted Hb and plan for transfusion  Objective: Vital signs in last 24 hours: Temp:  [97.3 F (36.3 C)-98.6 F (37 C)] 97.4 F (36.3 C) (03/04 0415) Pulse Rate:  [79-107] 80 (03/04 0600) Resp:  [11-24] 12 (03/04 0600) BP: (74-119)/(45-75) 115/66 mmHg (03/04 0600) SpO2:  [94 %-100 %] 100 % (03/04 0600) Arterial Line BP: (100-101)/(64) 100/64 mmHg (03/03 1400) FiO2 (%):  [40 %-60 %] 40 % (03/04 0307) Weight:  [178 lb 9.2 oz (81 kg)] 178 lb 9.2 oz (81 kg) (03/04 0450) Last BM Date: 03/25/12 (Ileostomy)  Intake/Output from previous day: 03/03 0701 - 03/04 0700 In: 3407.1 [I.V.:1802.1; Blood:575; NG/GT:30; IV Piggyback:1000] Out: 3025 [Urine:1425; Drains:1600] Intake/Output this shift:    PE: Abd: +BS, soft, non-distended.  Vac in place. No complications.  Colostomy dark but intact and with minimal output. Wound Vac: out put noted, serosanguinous fluid draining well.    Lab Results:   Recent Labs  03/26/12 1400 03/27/12 0400  WBC 12.3* 9.1  HGB 6.2* 6.3*  HCT 20.8* 20.1*  PLT 68* 55*   BMET  Recent Labs  03/26/12 1400 03/27/12 0400  NA 146* 149*  K 3.8 3.4*  CL 113* 116*  CO2 19 21  GLUCOSE 204* 108*  BUN 99* 96*  CREATININE 2.08* 2.04*  CALCIUM 7.8* 7.9*   PT/INR  Recent Labs  03/26/12 0915  LABPROT 26.8*  INR 2.63*   CMP     Component Value Date/Time   NA 149* 03/27/2012 0400   K 3.4* 03/27/2012 0400   CL 116* 03/27/2012 0400   CO2 21 03/27/2012 0400   GLUCOSE 108* 03/27/2012 0400   BUN 96* 03/27/2012 0400   CREATININE 2.04* 03/27/2012 0400   CALCIUM 7.9* 03/27/2012 0400   PROT 4.1* 03/27/2012 0400   ALBUMIN 2.5* 03/27/2012 0400   AST 133* 03/27/2012 0400   ALT 149* 03/27/2012 0400    ALKPHOS 491* 03/27/2012 0400   BILITOT 13.6* 03/27/2012 0400   GFRNONAA 36* 03/27/2012 0400   GFRAA 41* 03/27/2012 0400   Lipase     Component Value Date/Time   LIPASE 9* 02/26/2012 1409       Studies/Results: Dg Abd 1 View  03/25/2012  *RADIOLOGY REPORT*  Clinical Data: Confirm NG tube placement.  ABDOMEN - 1 VIEW  Comparison: MRCP - 03/22/2012  Findings:  NG tube tip and side port project over the left upper abdominal quadrant, overlying the expected location of the mid body of the stomach. Surgical drains overlying the peripheral aspect of the upper abdomen bilaterally.  There is a paucity of bowel gas without definite evidence of obstruction.  Prominence of the splenic shadow compatible with known perisplenic fluid collection.  Limited visualization of the lower thorax suggests left basilar opacities, likely atelectasis. No acute osseous abnormalities.  IMPRESSION: 1.  NG tube tip and side port project over the expected location of the mid body of the stomach.  2. Prominence of the splenic shadow compatible with a known perisplenic fluid collection.   Original Report Authenticated By: Tacey Ruiz, MD    Dg Chest Port 1 View  03/26/2012  *RADIOLOGY REPORT*  Clinical Data: Endotracheal tube check  PORTABLE CHEST -  1 VIEW  Comparison: 03/26/2012  Findings: Endotracheal tube in good position.  Left jugular catheter tip in the SVC, unchanged.  NG tube in the stomach.  Bilateral pleural effusions, left greater than right are similar. There is left lower lobe consolidation due to effusion.  IMPRESSION: Endotracheal tube in good position.  Moderately large bilateral effusions and dependent atelectasis in both lung bases.   Original Report Authenticated By: Janeece Riggers, M.D.    Dg Chest Port 1 View  03/26/2012  *RADIOLOGY REPORT*  Clinical Data: Shortness of breath.  PORTABLE CHEST - 1 VIEW  Comparison: 03/24/2012  Findings: Central line positioning stable with the tip in the SVC. Interval nasogastric tube  placement with the tube entering the stomach. Lungs show increase in atelectasis at the right lung base and persistent atelectasis/consolidation of the left lower lobe with probable associated moderate-sized left pleural effusion.  No overt edema is identified.  Heart size is stable.  IMPRESSION: Increase in atelectasis at the right lung base.  Persistent atelectasis/consolidation of the left lower lobe with probable left pleural effusion.   Original Report Authenticated By: Irish Lack, M.D.     Anti-infectives: Anti-infectives   Start     Dose/Rate Route Frequency Ordered Stop   03/26/12 2100  vancomycin (VANCOCIN) IVPB 1000 mg/200 mL premix     1,000 mg 200 mL/hr over 60 Minutes Intravenous Every 24 hours 03/25/12 1934     03/26/12 0945  ciprofloxacin (CIPRO) IVPB 400 mg     400 mg 200 mL/hr over 60 Minutes Intravenous Every 12 hours 03/26/12 0933     03/26/12 0600  aztreonam (AZACTAM) 1 g in dextrose 5 % 50 mL IVPB  Status:  Discontinued     1 g 100 mL/hr over 30 Minutes Intravenous 3 times per day 03/25/12 1934 03/26/12 0929   03/25/12 2200  metroNIDAZOLE (FLAGYL) IVPB 500 mg     500 mg 100 mL/hr over 60 Minutes Intravenous Every 8 hours 03/25/12 1909     03/25/12 2000  vancomycin (VANCOCIN) IVPB 1000 mg/200 mL premix     1,000 mg 200 mL/hr over 60 Minutes Intravenous STAT 03/25/12 1933 03/25/12 2054   03/25/12 2000  aztreonam (AZACTAM) 1 g in dextrose 5 % 50 mL IVPB     1 g 100 mL/hr over 30 Minutes Intravenous STAT 03/25/12 1933 03/25/12 2024   03/20/12 1145  ciprofloxacin (CIPRO) IVPB 400 mg    Comments:  ON CALL TO OR   400 mg 200 mL/hr over 60 Minutes Intravenous  Once 03/20/12 1143 03/20/12 1722   03/18/12 1000  vancomycin (VANCOCIN) 1,500 mg in sodium chloride 0.9 % 500 mL IVPB  Status:  Discontinued     1,500 mg 250 mL/hr over 120 Minutes Intravenous Every 48 hours 03/17/12 1920 03/19/12 0840   03/14/12 1000  micafungin (MYCAMINE) 100 mg in sodium chloride 0.9 % 100 mL  IVPB  Status:  Discontinued     100 mg 100 mL/hr over 1 Hours Intravenous Daily 03/13/12 0632 03/19/12 0840   03/13/12 1000  micafungin (MYCAMINE) 100 mg in sodium chloride 0.9 % 100 mL IVPB     100 mg 100 mL/hr over 1 Hours Intravenous Daily 03/13/12 0632 03/13/12 1311   03/12/12 1800  vancomycin (VANCOCIN) IVPB 1000 mg/200 mL premix  Status:  Discontinued     1,000 mg 200 mL/hr over 60 Minutes Intravenous Every 24 hours 03/12/12 0859 03/17/12 1017   03/12/12 1000  imipenem-cilastatin (PRIMAXIN) 500 mg in sodium chloride 0.9 % 100  mL IVPB  Status:  Discontinued     500 mg 200 mL/hr over 30 Minutes Intravenous 3 times per day 03/12/12 0848 03/12/12 0909   03/12/12 1000  imipenem-cilastatin (PRIMAXIN) 250 mg in sodium chloride 0.9 % 100 mL IVPB  Status:  Discontinued     250 mg 200 mL/hr over 30 Minutes Intravenous 4 times per day 03/12/12 0909 03/19/12 0841   03/11/12 1200  vancomycin (VANCOCIN) 750 mg in sodium chloride 0.9 % 150 mL IVPB  Status:  Discontinued     750 mg 150 mL/hr over 60 Minutes Intravenous Every 12 hours 03/11/12 0204 03/12/12 0858   03/11/12 0215  vancomycin (VANCOCIN) 750 mg in sodium chloride 0.9 % 150 mL IVPB     750 mg 150 mL/hr over 60 Minutes Intravenous  Once 03/11/12 0204 03/11/12 0352   03/11/12 0215  aztreonam (AZACTAM) 1 g in dextrose 5 % 50 mL IVPB  Status:  Discontinued     1 g 100 mL/hr over 30 Minutes Intravenous 3 times per day 03/11/12 0204 03/12/12 0851   03/05/12 2200  metroNIDAZOLE (FLAGYL) IVPB 500 mg  Status:  Discontinued    Comments:  First dose ASAP   500 mg 100 mL/hr over 60 Minutes Intravenous Every 8 hours 03/05/12 1346 03/19/12 1509   03/02/12 1200  vancomycin (VANCOCIN) 50 mg/mL oral solution 500 mg  Status:  Discontinued     500 mg Oral 4 times per day 03/02/12 0913 03/19/12 1509   03/01/12 1445  vancomycin (VANCOCIN) 50 mg/mL oral solution 500 mg  Status:  Discontinued     500 mg Oral 3 times per day 03/01/12 1340 03/02/12 0913    03/01/12 1400  aztreonam (AZACTAM) 1 g in dextrose 5 % 50 mL IVPB  Status:  Discontinued     1 g 100 mL/hr over 30 Minutes Intravenous 3 times per day 03/01/12 1356 03/02/12 1146   03/01/12 1300  vancomycin (VANCOCIN) 1,250 mg in sodium chloride 0.9 % 250 mL IVPB  Status:  Discontinued     1,250 mg 166.7 mL/hr over 90 Minutes Intravenous Every 12 hours 03/01/12 1127 03/02/12 1146   02/29/12 0600  ciprofloxacin (CIPRO) IVPB 400 mg     400 mg 200 mL/hr over 60 Minutes Intravenous On call to O.R. 02/28/12 1409 02/28/12 1438   02/28/12 2200  vancomycin (VANCOCIN) 500 mg in sodium chloride irrigation 0.9 % 60 mL ENEMA  Status:  Discontinued     500 mg Rectal 2 times daily 02/28/12 1504 02/28/12 1817   02/28/12 1200  vancomycin (VANCOCIN) 50 mg/mL oral solution 500 mg  Status:  Discontinued    Comments:  First dose ASAP   500 mg Per Tube 4 times per day 02/28/12 0814 02/28/12 1817   02/28/12 1000  vancomycin (VANCOCIN) IVPB 1000 mg/200 mL premix  Status:  Discontinued     1,000 mg 200 mL/hr over 60 Minutes Intravenous Every 12 hours 02/28/12 0845 03/01/12 1127   02/28/12 1000  vancomycin (VANCOCIN) 500 mg in sodium chloride irrigation 0.9 % 100 mL ENEMA  Status:  Discontinued     500 mg Rectal 2 times daily 02/28/12 0923 02/28/12 1504   02/27/12 1600  metroNIDAZOLE (FLAGYL) IVPB 500 mg  Status:  Discontinued     500 mg 100 mL/hr over 60 Minutes Intravenous Every 8 hours 02/27/12 1546 02/27/12 2000   02/27/12 1600  ciprofloxacin (CIPRO) IVPB 400 mg  Status:  Discontinued     400 mg 200 mL/hr over 60 Minutes  Intravenous Every 12 hours 02/27/12 1547 02/27/12 2000   02/27/12 1545  metroNIDAZOLE (FLAGYL) IVPB 500 mg  Status:  Discontinued     500 mg 100 mL/hr over 60 Minutes Intravenous  Once 02/27/12 1535 02/28/12 0843   02/27/12 1545  vancomycin (VANCOCIN) 500 mg in sodium chloride 0.9 % 100 mL IVPB  Status:  Discontinued     500 mg 100 mL/hr over 60 Minutes Intravenous  Once 02/27/12 1535  02/27/12 1538   02/27/12 1545  ciprofloxacin (CIPRO) IVPB 400 mg  Status:  Discontinued     400 mg 200 mL/hr over 60 Minutes Intravenous  Once 02/27/12 1538 02/28/12 0843   02/27/12 1415  vancomycin (VANCOCIN) 500 mg in sodium chloride 0.9 % 100 mL IVPB     500 mg 100 mL/hr over 60 Minutes Intravenous  Once 02/27/12 1402 02/27/12 1411   02/27/12 1400  vancomycin (VANCOCIN) powder 500 mg  Status:  Discontinued     500 mg Other To Surgery 02/27/12 1359 02/27/12 1402   02/27/12 1200  metroNIDAZOLE (FLAGYL) IVPB 500 mg  Status:  Discontinued    Comments:  First dose ASAP   500 mg 100 mL/hr over 60 Minutes Intravenous Every 6 hours 02/27/12 0953 03/05/12 1346   02/27/12 1200  vancomycin (VANCOCIN) 50 mg/mL oral solution 250 mg  Status:  Discontinued    Comments:  First dose ASAP   250 mg Oral 4 times per day 02/27/12 0953 02/28/12 0814   02/26/12 1700  oseltamivir (TAMIFLU) capsule 75 mg     75 mg Oral  Once 02/26/12 1651 02/26/12 1715       Assessment/Plan # 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 # Wound dehiscence with evisceration - post op Day #1 from Exploratory Laparotomy for Abdominal VAC # High Output - remains following surgery, likely due to hypoalbuminemia and ascites, no evidence of ureteral injury # Hyperbilirubinemia - s/p intraoperative Liver Biopsy - 03/26/12 # Anemia and Coagulopathy - s/p 2 Units FFP and 1 Unit PRBC intra-operatively on 03/26/12; plan for repeat PRBC today per CCM # Colostomy - will need  # Multiple other medical problems # S/p Cystoscopy by Urology without evidence of ureteral injury  Plan: ABX per CCM; plan for attempted extubation today Restart TFs; NPO after midnight on 3/5 for surg 3/6 Plan for Return to OR on Thursday 3/6 (72 hours)   LOS: 30 days    Andrena Mews, DO [facility] Resident - PGY-2 03/27/2012 7:46 AM Pager: [phone]

## 2012-03-27 NOTE — Progress Notes (Signed)
Orthopedic Tech Progress Note Patient Details:  Connor Small 06-Jun-1960 [id number]  Ortho Devices Type of Ortho Device: Abdominal binder Ortho Device/Splint Location: abdomen Ortho Device/Splint Interventions: Criss Alvine 03/27/2012, 7:50 PM

## 2012-03-28 LAB — COMPREHENSIVE METABOLIC PANEL
AST: 82 U/L — ABNORMAL HIGH (ref 0–37)
Albumin: 2.7 g/dL — ABNORMAL LOW (ref 3.5–5.2)
Alkaline Phosphatase: 512 U/L — ABNORMAL HIGH (ref 39–117)
BUN: 84 mg/dL — ABNORMAL HIGH (ref 6–23)
Chloride: 113 mEq/L — ABNORMAL HIGH (ref 96–112)
Creatinine, Ser: 1.83 mg/dL — ABNORMAL HIGH (ref 0.50–1.35)
Potassium: 3.4 mEq/L — ABNORMAL LOW (ref 3.5–5.1)
Total Protein: 3.9 g/dL — ABNORMAL LOW (ref 6.0–8.3)

## 2012-03-28 LAB — GLUCOSE, CAPILLARY
Glucose-Capillary: 101 mg/dL — ABNORMAL HIGH (ref 70–99)
Glucose-Capillary: 102 mg/dL — ABNORMAL HIGH (ref 70–99)
Glucose-Capillary: 110 mg/dL — ABNORMAL HIGH (ref 70–99)
Glucose-Capillary: 112 mg/dL — ABNORMAL HIGH (ref 70–99)

## 2012-03-28 LAB — CBC WITH DIFFERENTIAL/PLATELET
Eosinophils Relative: 0 % (ref 0–5)
HCT: 24 % — ABNORMAL LOW (ref 39.0–52.0)
Lymphs Abs: 0.3 10*3/uL — ABNORMAL LOW (ref 0.7–4.0)
MCH: 29.2 pg (ref 26.0–34.0)
MCV: 87.6 fL (ref 78.0–100.0)
Monocytes Absolute: 0.2 10*3/uL (ref 0.1–1.0)
Monocytes Relative: 3 % (ref 3–12)
Neutro Abs: 7.4 10*3/uL (ref 1.7–7.7)
RBC: 2.74 MIL/uL — ABNORMAL LOW (ref 4.22–5.81)
WBC: 7.9 10*3/uL (ref 4.0–10.5)

## 2012-03-28 LAB — TYPE AND SCREEN
ABO/RH(D): O NEG
Antibody Screen: NEGATIVE
Unit division: 0

## 2012-03-28 LAB — URINE MICROSCOPIC-ADD ON

## 2012-03-28 LAB — BILIRUBIN, FRACTIONATED(TOT/DIR/INDIR)
Bilirubin, Direct: 10.8 mg/dL — ABNORMAL HIGH (ref 0.0–0.3)
Indirect Bilirubin: 3 mg/dL — ABNORMAL HIGH (ref 0.3–0.9)
Total Bilirubin: 13.8 mg/dL — ABNORMAL HIGH (ref 0.3–1.2)

## 2012-03-28 LAB — FIBRINOGEN: Fibrinogen: 267 mg/dL (ref 204–475)

## 2012-03-28 LAB — URINALYSIS, ROUTINE W REFLEX MICROSCOPIC
Ketones, ur: NEGATIVE mg/dL
Nitrite: NEGATIVE
Urobilinogen, UA: 0.2 mg/dL (ref 0.0–1.0)

## 2012-03-28 MED ORDER — POTASSIUM CL IN DEXTROSE 5% 20 MEQ/L IV SOLN
20.0000 meq | INTRAVENOUS | Status: DC
  Administered 2012-03-28 – 2012-04-01 (×6): 20 meq via INTRAVENOUS
  Filled 2012-03-28 (×11): qty 1000

## 2012-03-28 MED ORDER — POTASSIUM CHLORIDE 10 MEQ/50ML IV SOLN
10.0000 meq | INTRAVENOUS | Status: AC
  Administered 2012-03-28 (×2): 10 meq via INTRAVENOUS
  Filled 2012-03-28: qty 100

## 2012-03-28 MED ORDER — BIOTENE DRY MOUTH MT LIQD
15.0000 mL | Freq: Two times a day (BID) | OROMUCOSAL | Status: DC
  Administered 2012-03-28 – 2012-04-04 (×15): 15 mL via OROMUCOSAL

## 2012-03-28 MED ORDER — CHLORHEXIDINE GLUCONATE 0.12 % MT SOLN
15.0000 mL | Freq: Two times a day (BID) | OROMUCOSAL | Status: DC
  Administered 2012-03-28 – 2012-04-04 (×16): 15 mL via OROMUCOSAL
  Filled 2012-03-28 (×16): qty 15

## 2012-03-28 NOTE — Progress Notes (Signed)
Patient interviewed and examined, agree with note above.  Edward Jolly MD, FACS  03/28/2012 11:32 AM

## 2012-03-28 NOTE — Progress Notes (Signed)
Occupational Therapy Treatment Patient Details Name: Connor Small MRN: [id number] DOB: Jun 24, 1960 Today's Date: 03/28/2012 Time: 1021-1050 OT Time Calculation (min): 29 min  OT Assessment / Plan / Recommendation Comments on Treatment Session Received vo for pt to participate in bed level exercise. Focus of session on educating wife on BUE A/AA/PROM, positioning and edema control. Wife needs further education.       LUE improving in proximal strength. Previously, pt with inferiorly subluxed shoulder when unsupported due to weakness. This has improved - L elbow supported with 2 pillows to improve positioning. Encourgaed pt/family to complete functional movement patterns LUE and educated on gentle shoulder strengthening. ? ataxia       R shoulder is subluxed inferiorly, but approximates well with positioning. No c/o pain. Rec for staff to continue with positioing using 1 pillow behind Rshoulder and 3 pillows under elbow to properly support shoulder. Do not recommend using brace/sling to reduce subluxation at this time. Pt demonstrates proximal weakness, however, appears to be improving slowly. Will teach wife shoulder stability exercises to assist with reducing subluxation.      Pt does NOT demonstrate active wrist extension, and would benefit from R wrist cock up splint. will progress to OOB when cleared by MD.    Follow Up Recommendations  CIR    Barriers to Discharge       Equipment Recommendations  3 in 1 bedside comode;Tub/shower bench    Recommendations for Other Services Rehab consult  Frequency Min 3X/week   Plan Discharge plan remains appropriate    Precautions / Restrictions Precautions Precautions: Fall Precaution Comments: abdominal wound Restrictions Weight Bearing Restrictions: No   Pertinent Vitals/Pain Vitals stable.     ADL  ADL Comments: focus of session on education onBUE A/AA/PROM, positionoing and edema control    OT Diagnosis:    OT Problem List:   OT  Treatment Interventions:     OT Goals Acute Rehab OT Goals OT Goal Formulation: Patient unable to participate in goal setting Time For Goal Achievement: 04/02/12 Potential to Achieve Goals: Good ADL Goals Pt Will Perform Grooming: with mod assist;Sitting, chair;Supported;with cueing (comment type and amount) ADL Goal: Grooming - Progress: Progressing toward goals Pt Will Perform Upper Body Bathing: with mod assist;Sitting, chair;Supported;with cueing (comment type and amount) ADL Goal: Upper Body Bathing - Progress: Progressing toward goals Additional ADL Goal #1: Pt will complete bed mobility with mod A in preparation for ADL.  Additional ADL Goal #2: Pt will sit EOB with min A supported in preparation for ADL Arm Goals Additional Arm Goal #1: Wife will complete P/AAROM BUE with S Arm Goal: Additional Goal #1 - Progress: Progressing toward goals Miscellaneous OT Goals Miscellaneous OT Goal #1: Wife will complete edema control techniques with S R hand OT Goal: Miscellaneous Goal #1 - Progress: Met  Visit Information  Last OT Received On: 03/28/12 Assistance Needed: +2 PT/OT Co-Evaluation/Treatment: Yes    Subjective Data      Prior Functioning       Cognition  Cognition Overall Cognitive Status: Impaired Area of Impairment: Attention Arousal/Alertness: Awake/alert Behavior During Session: [facility] for tasks performed Current Attention Level: Sustained (externally distracted) Attention - Other Comments: still needs frequent redirection Following Commands: Follows one step commands with increased time Cognition - Other Comments: Educated family to turn off TV when working on exercise. Only 1 person talk at a time.    Mobility  Bed Mobility Bed Mobility: Not assessed    Exercises  General Exercises - Upper Extremity  Shoulder Flexion: Left;AAROM;Right;5 reps;Supine Shoulder ABduction: Left;AAROM;Right;5 reps;Supine Elbow Flexion: Left;AROM;5 reps;Right;AAROM;Supine Elbow  Extension: Right;Left;AROM;5 reps;Supine Wrist Flexion: Left;Right;5 reps;Supine Wrist Extension: Left;AROM;Right;PROM;5 reps;Supine Digit Composite Flexion: Left;AROM;Right;AAROM;5 reps;Supine Composite Extension: Left;AROM;5 reps;Right;PROM General Exercises - Lower Extremity Ankle Circles/Pumps: AROM;AAROM;Both;15 reps;Supine Heel Slides: AROM;AAROM;Strengthening;15 reps;Supine Hip ABduction/ADduction: AROM;AAROM;Both;10 reps;Supine Other Exercises Other Exercises: reinforced AA/P/ROM techniques to wife making sure she knows to have him assist. Other Exercises: Positioning techniques--positioning boots   Balance Balance Balance Assessed: No   End of Session OT - End of Session Activity Tolerance: Patient tolerated treatment well Patient left: in bed;with call bell/phone within reach;with nursing in room;with family/visitor present Nurse Communication: Mobility status;Other (comment) (need to keep RUE supported)  GO     WARD,HILLARY 03/28/2012, 12:06 PM [facility], OTR/L  [phone] 03/28/2012

## 2012-03-28 NOTE — Progress Notes (Signed)
Physical Therapy Treatment Patient Details Name: Connor Small MRN: [id number] DOB: 26-Mar-1960 Today's Date: 03/28/2012 Time: 1021-1059 PT Time Calculation (min): 38 min  PT Assessment / Plan / Recommendation Comments on Treatment Session  Pt very fatigued and restless.  Pt could not handle ranging and stretching without frequent breaks.  Family came in after having mostly finished ROM so  reinforced ROM/Positioning with them.  Repitition needed due to frequent interruptions.    Follow Up Recommendations  CIR     Does the patient have the potential to tolerate intense rehabilitation     Barriers to Discharge        Equipment Recommendations  Other (comment)    Recommendations for Other Services    Frequency Min 3X/week   Plan Discharge plan remains appropriate;Frequency needs to be updated    Precautions / Restrictions Precautions Precautions: Fall Restrictions Weight Bearing Restrictions: No   Pertinent Vitals/Pain     Mobility  Bed Mobility Bed Mobility: Not assessed Transfers Transfers: Not assessed Wheelchair Mobility Wheelchair Mobility: No    Exercises General Exercises - Lower Extremity Ankle Circles/Pumps: AROM;AAROM;Both;15 reps;Supine Heel Slides: AROM;AAROM;Strengthening;15 reps;Supine Hip ABduction/ADduction: AROM;AAROM;Both;10 reps;Supine Other Exercises Other Exercises: reinforced AA/P/ROM techniques to wife making sure she knows to have him assist. Other Exercises: Positioning techniques--positioning boots   PT Diagnosis:    PT Problem List:   PT Treatment Interventions:     PT Goals Acute Rehab PT Goals PT Goal Formulation: With patient Time For Goal Achievement: 04/09/12 Potential to Achieve Goals: Good Pt will go Supine/Side to Sit: with mod assist;with rail PT Goal: Supine/Side to Sit - Progress: Revised due to lack of progress Pt will Sit at Edge of Bed: with mod assist;1-2 min;with bilateral upper extremity support PT Goal: Sit at Edge  Of Bed - Progress: Revised due to lack of progress Pt will Transfer Bed to Chair/Chair to Bed: with max assist PT Transfer Goal: Bed to Chair/Chair to Bed - Progress: Goal set today Pt will Perform Home Exercise Program: with min assist PT Goal: Perform Home Exercise Program - Progress: Revised due to lack of progress  Visit Information  Last PT Received On: 03/28/12 Assistance Needed: +1 (aarom only at present)    Subjective Data  Subjective: Please stop, just for a few minutes  (due to pain) Patient Stated Goal: Unable to state   Cognition  Cognition Overall Cognitive Status: Difficult to assess Area of Impairment: Attention;Problem solving;Following commands Arousal/Alertness: Awake/alert Behavior During Session: Connor Small for tasks performed Current Attention Level: Sustained Attention - Other Comments: still needs frequent redirection Following Commands: Follows one step commands with increased time    Balance  Balance Balance Assessed: No  End of Session PT - End of Session Activity Tolerance: Patient limited by fatigue;Patient limited by pain Patient left: in bed;with bed alarm set;with call bell/phone within reach;with family/visitor present Nurse Communication: Mobility status   GP     Connor Small, Connor Small 03/28/2012, 11:12 AM  03/28/2012  Connor Small, [name] [phone] (pager)

## 2012-03-28 NOTE — Progress Notes (Signed)
PULMONARY  / CRITICAL CARE MEDICINE  Name: Connor Small MRN: [id number] DOB: 1960-05-15    ADMISSION DATE:  02/26/2012 CONSULTATION DATE:  2/5  REFERRING MD :  Karilyn Cota  CHIEF COMPLAINT:  Acute resp failure   BRIEF PATIENT DESCRIPTION:  9 YOM admitted to [facility] for Cdiff on 2/2. Underwent decompressive cecostomy 2/3, initially was better, then worsened on 2/4 with increased abdominal distension and tenderness, decreased renal function.  He was taken back to the OR on 2/4 for total abdominal colectomy and end ileostomy.  Post op he developed ARDS and septic shock and was being treated for this.  The morning of 2/6 he had new onset of A-fib and transferred to [facility].   Course complicated by severe hypoalbuminemia, elevated LFTs  -cholestatic without biliary obstruction & pre-renal failure. Hypotensive 3/2 am requiring albumin  SIGNIFICANT EVENTS / STUDIES:  2/2 Admit to [facility] with C diff, toxic megacolon 2/3 Decompressive Cecostomy 2/4 Total abdominal colectomy and end ileostomy 2/6 New A-fib and transfer to [facility] health. 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 2/10 CT abd/pelvis: small hematoma likely subcapsular spleen, JPs wnl 2/11 trial TFs  2/11 UE venous dopplers: Findings consistent with superficial vein thrombosis involving the right basilic vein 2/14 LE venous dopplers: No evidence of deep vein thrombosis 2/14 LUE venous doppler: No evidence of deep vein or superficial thrombosis involving the left upper extremity  2/15 neg 4 l bal on lasix drip, lasix d/c  2/16 poor neuro status, CT head: [facility] 2/17 CRRT initiated, high pressor needs 2/17 RUQ [place]: Gallbladder wall thickening in the absence of sludge or stones but no biliary ductal dilatation 2/18 reduced vasopressor reqts 2/19 New onset thrombocytopenia. Heparin D/C'd. Argatroban  initiated 2/19 [place] abd (repeat): No explanation for elevated liver function tests 2/20 reduction pressors, improved alertness, extubated 2/20 LE venous dopplers: No evidence of deep vein or superficial thrombosis 2/21 bleeding overnight from JP's, argatroban turned off, lower pressor needs 2/22 off pressors, on RA, converted to NSR 2/22 Carotid dopplers: No significant extracranial carotid artery stenosis demonstrated 2/25 Cystoscopy with bilateral retrograde pyelograms. No extravasation of contrast from either ureteral orifice. No evidence of hydroureteronephrosis. Unremarkable urinary bladder. B ureteral stents placed 2/27 MRI brain: Scattered small acute to subacute infarcts in both cerebral hemispheres, brainstem, and right cerebellum. Favor sequelae of emboli from a cardiac or proximal aortic source. Main alternate consideration is hypotensive episode 2/27 MRCP: No intrahepatic bile duct dilatation or common bile duct dilatation. No gallstones identified. Large subcapsular fluid collection encases the spleen. This is of indeterminate etiology. This may represent a subacute to chronic subcapsular hematoma or abscess. 2/28 SLP eval: FEES performed. Oropharyngeal weakness noted. Recommend replacement of NGT to supplement PO intake 3/1 Renal signed off 3/3- bowel evisceration, to OR 3/3- no perf / abscess, wound vac open could not close, Liver BX, some oozing, post op resp failure 3/3 echo repeat - improved ef55%, poor windows 3/4- low dose pressor needs 3/4- extubated, high output again from JP's   LINES / TUBES: ETT ([facility]) 2/4>>> 2/20 PICC ([facility]) 2/4>>>2/11 Art Line ([facility]) 2/5>>>out L IJ CVL 2/06 >> out A line R fem 2/18>>>2/24 Rt ij HD 2/17 >> out Left IJ 2/18 >>  ETT 3/3>>>  CULTURES: BCx2 2/4>>>negative BCx2 2/3>>>negative UC 2/3- Negative MRSA PCR 2/3- Negative C diff 2/6>>>Positive Body fluid 2/9>>>WBCs, no organisms 2/15 BC x  2 >> NEG 2/15 Sputum >>>NF 3/2 bc>>> 3/2  urine>>> 3/2 sputum>>> 3/5 cdiff>>>  PATH: 3/3 liver bx>>>BENIGN LIVER WITH CHOLESTASIS, The biopsy findings are that of extrahepatic biliary obstruction. No noted primary sclerosing cholangitis.  ANTIBIOTICS: Flagyl 2/3>> d/c'd Vanc 2/6 (IV) >>>2/7 azactam 2/6>>>2/7 Oral vanc 2/6>>>off IV vanc 2/16 >2/24 Aztreonam 2/16 >2/17 Imipenem 2/17>>>2/24 mycofungin 2/18>>>2/24  cipro 3/3>>> azactam 3/2>>>3/3 vanc 3/2>>>3/5 Flagyl 3/3>>>3/5  SUBJECTIVE:  No pressors  VITAL SIGNS: Temp:  [97.4 F (36.3 C)-97.9 F (36.6 C)] 97.6 F (36.4 C) (03/05 0814) Pulse Rate:  [36-102] 62 (03/05 0800) Resp:  [8-23] 23 (03/05 0800) BP: (85-119)/(48-70) 90/67 mmHg (03/05 0800) SpO2:  [80 %-100 %] 99 % (03/05 0800)   INTAKE / OUTPUT: Intake/Output     03/04 0701 - 03/05 0700 03/05 0701 - 03/06 0700   I.V. (mL/kg) 1399.3 (17.3) 50 (0.6)   Blood 633    NG/GT 490 30   IV Piggyback 1450 50   Total Intake(mL/kg) 3972.3 (49) 130 (1.6)   Urine (mL/kg/hr) 1650 (0.8)    Drains 2175 (1.1) 250 (1.5)   Stool 50 (0)    Total Output 3875 250   Net +97.3 -120        Stool Occurrence 1 x      PHYSICAL EXAMINATION: General Icteric, Neuro: follows commands, confused HEENT: Sclericterus, jvd up Cardiovascular: RRR  s1 s2 Lungs: reduced bases Abdomen: soft, NT, NO BS, no r Musculoskeletal: 3+ LE edema  Skin: rash improved / resolved  LABS: BMET    Component Value Date/Time   NA 146* 03/28/2012 0500   K 3.4* 03/28/2012 0500   CL 113* 03/28/2012 0500   CO2 22 03/28/2012 0500   GLUCOSE 112* 03/28/2012 0500   BUN 84* 03/28/2012 0500   CREATININE 1.83* 03/28/2012 0500   CALCIUM 8.1* 03/28/2012 0500   GFRNONAA 40* 03/28/2012 0500   GFRAA 47* 03/28/2012 0500    Hepatic Function Panel     Component Value Date/Time   PROT 3.9* 03/28/2012 0500   ALBUMIN 2.7* 03/28/2012 0500   AST 82* 03/28/2012 0500   ALT 111* 03/28/2012 0500   ALKPHOS 512* 03/28/2012 0500   BILITOT 13.8* 03/28/2012 0500   BILIDIR 14.2* 03/24/2012  0355   IBILI 4.4* 03/24/2012 0355    CBC    Component Value Date/Time   WBC 7.9 03/28/2012 0500   RBC 2.74* 03/28/2012 0500   HGB 8.0* 03/28/2012 0500   HCT 24.0* 03/28/2012 0500   PLT 53* 03/28/2012 0500   MCV 87.6 03/28/2012 0500   MCH 29.2 03/28/2012 0500   MCHC 33.3 03/28/2012 0500   RDW 26.3* 03/28/2012 0500   LYMPHSABS 0.3* 03/28/2012 0500   MONOABS 0.2 03/28/2012 0500   EOSABS 0.0 03/28/2012 0500   BASOSABS 0.0 03/28/2012 0500      Recent Labs Lab 03/27/12 1622 03/27/12 2003 03/28/12 0008 03/28/12 0408 03/28/12 0807  GLUCAP 104* 106* 110* 99 112*    Dg Chest Port 1 View  03/27/2012  *RADIOLOGY REPORT*  Clinical Data: Assess ET tube and left-sided effusion  PORTABLE CHEST - 1 VIEW  Comparison: 03/26/2012  Findings: Endotracheal tube terminates 4 cm above the carina.  Stable left IJ venous catheter.  Enteric tube courses below the diaphragm.  Moderate left and small right pleural effusions, grossly unchanged. Associated bibasilar opacities, likely atelectasis.  No frank interstitial edema.  No pneumothorax.  The heart is normal in size.  IMPRESSION: Endotracheal tube terminates 4 cm above the carina.  Moderate left and  small right pleural effusions, grossly unchanged.   Original Report Authenticated By: Charline Bills, M.D.    Dg Chest Port 1 View  03/26/2012  *RADIOLOGY REPORT*  Clinical Data: Endotracheal tube check  PORTABLE CHEST - 1 VIEW  Comparison: 03/26/2012  Findings: Endotracheal tube in good position.  Left jugular catheter tip in the SVC, unchanged.  NG tube in the stomach.  Bilateral pleural effusions, left greater than right are similar. There is left lower lobe consolidation due to effusion.  IMPRESSION: Endotracheal tube in good position.  Moderately large bilateral effusions and dependent atelectasis in both lung bases.   Original Report Authenticated By: Janeece Riggers, M.D.      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 Sedation for vent 3/3 -int fent as goal -WUA -upright -will d/w surgery with open abdo, rolePT    Severe muscle deconditioning   Shoulder subluxation, right - Cont PT/OT discuss R shoulder subluxation with Ortho -OT recs, will re initiate post vent  GI Wound evisceration    Hyperbilirubinemia, due to cholestasis - Improved mild, AT risk   Dysphagia due to oropharyngeal weakness -Liver bx noted, repeat direct, if remains conjugates, will d/w surgery to place drain vs cheolecystectomy as HIDA likely not helpful -vac change plan in am  -restart TF trickle done, remain at 10 cc/hr, npo for OR in am     Thrombocytopenia, unspecified, HIT panel negative    Coagulopathy - likely due to hepatic dysfunction  Anemia, mild. MIld OR loss -scd -coags repeat for OR in am , may need ffp with prior oozing -fibrinogen, factor level to assess low grade DIC- if present would consider pre op cryo  Pulm    Pleural effusion, L > R Post op resp failure -extubated -IS when able -can we use LMA in or thur? Will d/w aneasthesia   Renal   ARF (acute renal failure), Cr leveling off -?now CKD   Hypernatremia, improving slowly hypoK -add d5w -free water Chem in am -k supp again and add to bag   Endocrine    Relative adrenal insufficiency - hydrocortisone increase 3-2 for hypotension  roids to q6h, remain as borderline bP  ID Resolved cdiff with MODS Bowel evisceration NO new source IDentified, likely was sirs with evisc -dc vanc, flagyl -Limited ability to interpret cdiff, clinically does not have reoccurence -continue cipro for risk bowel translocation for now -see GI, consider drain gallbladder  Resolved Problems:    history of UC  (ulcerative colitis)   Atrial fibrillation, resolved   Acute respiratory failure, resolved   Septic shock, resolved   RUE DVT related to PICC, resolved   Hypokalemia, resolved   Hyperglycemia, resolved   Acute encephalopathy, resolved    Will update wife  Mcarthur Rossetti. Tyson Alias, MD, FACP Pgr: [phone]  Pulmonary & Critical Care

## 2012-03-28 NOTE — Progress Notes (Signed)
SLP Cancellation Note  Patient Details Name: Connor Small MRN: [id number] DOB: [birth year]   Cancelled treatment:       Reason Eval/Treat Not Completed: Medical issues which prohibited therapy;Patient not medically ready. Per MD note, pt to return to OR tomorrow. SLP will hold off therapy today, follow via chart for readiness.   [name], [place] CCC-SLP [phone]  [name] 03/28/2012, 10:30 AM

## 2012-03-28 NOTE — Progress Notes (Signed)
Hypokalemia   K replaced  

## 2012-03-28 NOTE — Progress Notes (Signed)
Bladder scan complete. Patient has not voided since removal of catheter on night shift. Possible retention. 700cc confirmed on bladder scan. Dr. Titus Mould aware. Foley catheter placed.

## 2012-03-28 NOTE — Progress Notes (Signed)
Patient ID: Connor Small, male   DOB: May 25, 1960, 52 y.o.   MRN: [id number] 2 Days Post-Op  Subjective: Extubated yesterday uneventfully.  No new concerns. S/p 3 Units PRBC in past 36 hours Objective: Vital signs in last 24 hours: Temp:  [97.3 F (36.3 C)-97.9 F (36.6 C)] 97.5 F (36.4 C) (03/05 0411) Pulse Rate:  [36-102] 58 (03/05 0700) Resp:  [8-23] 10 (03/05 0700) BP: (85-119)/(48-70) 101/56 mmHg (03/05 0700) SpO2:  [80 %-100 %] 100 % (03/05 0700) Last BM Date: 03/27/12  Intake/Output from previous day: 03/04 0701 - 03/05 0700 In: 3972.3 [I.V.:1399.3; Blood:633; NG/GT:490; IV Piggyback:1450] Out: 3375 [Urine:1650; Drains:1675; Stool:50] Intake/Output this shift:    PE: Abd: +BS, soft, non-distended.  Vac in place. No complications.  Colostomy dark but intact and with dark output. Wound Vac: out put noted, serosanguinous fluid draining well.    Lab Results:   Recent Labs  03/27/12 1800 03/28/12 0500  WBC 8.9 7.9  HGB 8.4* 8.0*  HCT 24.8* 24.0*  PLT 56* 53*   BMET  Recent Labs  03/27/12 1800 03/28/12 0500  NA 147* 146*  K 3.4* 3.4*  CL 113* 113*  CO2 21 22  GLUCOSE 108* 112*  BUN 88* 84*  CREATININE 1.95* 1.83*  CALCIUM 7.9* 8.1*   PT/INR  Recent Labs  03/26/12 0915  LABPROT 26.8*  INR 2.63*   CMP     Component Value Date/Time   NA 146* 03/28/2012 0500   K 3.4* 03/28/2012 0500   CL 113* 03/28/2012 0500   CO2 22 03/28/2012 0500   GLUCOSE 112* 03/28/2012 0500   BUN 84* 03/28/2012 0500   CREATININE 1.83* 03/28/2012 0500   CALCIUM 8.1* 03/28/2012 0500   PROT 3.9* 03/28/2012 0500   ALBUMIN 2.7* 03/28/2012 0500   AST 82* 03/28/2012 0500   ALT 111* 03/28/2012 0500   ALKPHOS 512* 03/28/2012 0500   BILITOT 13.8* 03/28/2012 0500   GFRNONAA 40* 03/28/2012 0500   GFRAA 47* 03/28/2012 0500   Lipase     Component Value Date/Time   LIPASE 9* 02/26/2012 1409       Studies/Results: Dg Chest Port 1 View  03/27/2012  *RADIOLOGY REPORT*  Clinical Data: Assess ET tube and  left-sided effusion  PORTABLE CHEST - 1 VIEW  Comparison: 03/26/2012  Findings: Endotracheal tube terminates 4 cm above the carina.  Stable left IJ venous catheter.  Enteric tube courses below the diaphragm.  Moderate left and small right pleural effusions, grossly unchanged. Associated bibasilar opacities, likely atelectasis.  No frank interstitial edema.  No pneumothorax.  The heart is normal in size.  IMPRESSION: Endotracheal tube terminates 4 cm above the carina.  Moderate left and small right pleural effusions, grossly unchanged.   Original Report Authenticated By: Charline Bills, M.D.    Dg Chest Port 1 View  03/26/2012  *RADIOLOGY REPORT*  Clinical Data: Endotracheal tube check  PORTABLE CHEST - 1 VIEW  Comparison: 03/26/2012  Findings: Endotracheal tube in good position.  Left jugular catheter tip in the SVC, unchanged.  NG tube in the stomach.  Bilateral pleural effusions, left greater than right are similar. There is left lower lobe consolidation due to effusion.  IMPRESSION: Endotracheal tube in good position.  Moderately large bilateral effusions and dependent atelectasis in both lung bases.   Original Report Authenticated By: Janeece Riggers, M.D.    LIVER BIOPSY 03/26/12: - BENIGN Liver with Cholestasis:  Sections show benign liver with cholestatic bile deposition centered in the perivenular area with associated degenerative  hepatocyte changes. Bile deposition appears to spread into the mid zone. Although deposition of bile is largely absent in the portal areas, the interface areas show evidence of bile ductule formation. Significant inflammation is not present. Minimal portal fibrosis is identified on trichrome stain. Iron stain show no significant increase in hepatocyte iron. Reticulin stain shows a normal reticulin network. PAS stain shows no evidence of alpha-1 antitrypsin deficiency and no evidence of fungal infection. The biopsy findings are that of extrahepatic biliary obstruction.  Evaluation of the biliary system by a modality such as ERCP may be helpful as clinically indicated. As the patient has a known history of ulcerative colitis, assessment of all bile ducts is performed (assessing for the possibility of primary sclerosing cholangitis). However, the assessment is limited as only five portal areas are present for evaluation. 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 # Colostomy - will need re-eval tomorrow in OR # Multiple other medical problems # S/p Cystoscopy by Urology without evidence of ureteral injury  Plan: ABX per CCM;  Return to OR on 3/6 for VAC change.  Hold TFs after midnight and NPO    LOS: 31 days    Andrena Mews, DO [facility] Resident - PGY-2 03/28/2012 8:02 AM Pager: [phone]

## 2012-03-28 NOTE — Progress Notes (Signed)
LFTs improving 2 days post op. Liver looked grossly unremarkable at surgery. Await bx results and will cont to follow

## 2012-03-29 ENCOUNTER — Inpatient Hospital Stay (HOSPITAL_COMMUNITY): Payer: BC Managed Care – PPO | Admitting: Certified Registered"

## 2012-03-29 ENCOUNTER — Encounter (HOSPITAL_COMMUNITY): Payer: Self-pay | Admitting: Certified Registered"

## 2012-03-29 ENCOUNTER — Encounter (HOSPITAL_COMMUNITY)
Admission: EM | Disposition: A | Discharge status: Rehabilitation Facility | Payer: Self-pay | Source: Home / Self Care | Attending: Pulmonary Disease

## 2012-03-29 DIAGNOSIS — T8131XA Disruption of external operation (surgical) wound, not elsewhere classified, initial encounter: Secondary | ICD-10-CM

## 2012-03-29 HISTORY — PX: LAPAROTOMY: SHX154

## 2012-03-29 HISTORY — PX: VACUUM ASSISTED CLOSURE CHANGE: SHX5227

## 2012-03-29 LAB — GLUCOSE, CAPILLARY
Glucose-Capillary: 115 mg/dL — ABNORMAL HIGH (ref 70–99)
Glucose-Capillary: 131 mg/dL — ABNORMAL HIGH (ref 70–99)

## 2012-03-29 LAB — CBC WITH DIFFERENTIAL/PLATELET
Basophils Relative: 0 % (ref 0–1)
Eosinophils Relative: 0 % (ref 0–5)
Hemoglobin: 8.9 g/dL — ABNORMAL LOW (ref 13.0–17.0)
MCH: 29.5 pg (ref 26.0–34.0)
MCV: 88.1 fL (ref 78.0–100.0)
Monocytes Absolute: 0.2 10*3/uL (ref 0.1–1.0)
Monocytes Relative: 3 % (ref 3–12)
Neutrophils Relative %: 92 % — ABNORMAL HIGH (ref 43–77)
Platelets: 60 10*3/uL — ABNORMAL LOW (ref 150–400)
RBC: 3.02 MIL/uL — ABNORMAL LOW (ref 4.22–5.81)
WBC: 6.2 10*3/uL (ref 4.0–10.5)

## 2012-03-29 LAB — URINE CULTURE: Culture: NO GROWTH

## 2012-03-29 LAB — FACTOR 8 ASSAY: Coagulation Factor VIII: 187 % — ABNORMAL HIGH (ref 73–140)

## 2012-03-29 LAB — BASIC METABOLIC PANEL
CO2: 21 mEq/L (ref 19–32)
Chloride: 114 mEq/L — ABNORMAL HIGH (ref 96–112)
Glucose, Bld: 114 mg/dL — ABNORMAL HIGH (ref 70–99)
Sodium: 143 mEq/L (ref 135–145)

## 2012-03-29 LAB — PHOSPHORUS: Phosphorus: 4.7 mg/dL — ABNORMAL HIGH (ref 2.3–4.6)

## 2012-03-29 LAB — PROTIME-INR: Prothrombin Time: 17 seconds — ABNORMAL HIGH (ref 11.6–15.2)

## 2012-03-29 SURGERY — LAPAROTOMY, EXPLORATORY
Anesthesia: General | Site: Abdomen | Wound class: Clean Contaminated

## 2012-03-29 MED ORDER — HYDROCORTISONE SOD SUCCINATE 100 MG IJ SOLR
50.0000 mg | Freq: Two times a day (BID) | INTRAMUSCULAR | Status: DC
  Administered 2012-03-29: 50 mg via INTRAVENOUS

## 2012-03-29 MED ORDER — ARTIFICIAL TEARS OP OINT
TOPICAL_OINTMENT | OPHTHALMIC | Status: DC | PRN
  Administered 2012-03-29: 1 via OPHTHALMIC

## 2012-03-29 MED ORDER — PROPOFOL 10 MG/ML IV BOLUS
INTRAVENOUS | Status: DC | PRN
  Administered 2012-03-29: 80 mg via INTRAVENOUS

## 2012-03-29 MED ORDER — MEPERIDINE HCL 25 MG/ML IJ SOLN: 6.2500 mg | INTRAMUSCULAR | Status: DC | PRN

## 2012-03-29 MED ORDER — HYDROMORPHONE HCL PF 1 MG/ML IJ SOLN: 0.2500 mg | INTRAMUSCULAR | Status: DC | PRN

## 2012-03-29 MED ORDER — OXYCODONE HCL 5 MG/5ML PO SOLN: 5.0000 mg | Freq: Once | ORAL | Status: DC | PRN

## 2012-03-29 MED ORDER — ONDANSETRON HCL 4 MG/2ML IJ SOLN: 4.0000 mg | Freq: Once | INTRAMUSCULAR | Status: DC | PRN

## 2012-03-29 MED ORDER — LACTATED RINGERS IV SOLN
INTRAVENOUS | Status: DC | PRN
  Administered 2012-03-29: 10:00:00 via INTRAVENOUS

## 2012-03-29 MED ORDER — OXYCODONE HCL 5 MG PO TABS: 5.0000 mg | ORAL_TABLET | Freq: Once | ORAL | Status: DC | PRN

## 2012-03-29 MED ORDER — PHENYLEPHRINE HCL 10 MG/ML IJ SOLN
INTRAMUSCULAR | Status: DC | PRN
  Administered 2012-03-29: 80 ug via INTRAVENOUS

## 2012-03-29 MED ORDER — FENTANYL CITRATE 0.05 MG/ML IJ SOLN
INTRAMUSCULAR | Status: DC | PRN
  Administered 2012-03-29: 125 ug via INTRAVENOUS

## 2012-03-29 MED ORDER — SUCCINYLCHOLINE CHLORIDE 20 MG/ML IJ SOLN
INTRAMUSCULAR | Status: DC | PRN
  Administered 2012-03-29: 110 mg via INTRAVENOUS

## 2012-03-29 SURGICAL SUPPLY — 51 items
BLADE SURG ROTATE 9660 (MISCELLANEOUS) IMPLANT
CANISTER SUCT LVC 12 LTR MEDI- (MISCELLANEOUS) IMPLANT
CANISTER SUCTION 2500CC (MISCELLANEOUS) ×3 IMPLANT
CANISTER WOUND CARE 500ML ATS (WOUND CARE) ×3 IMPLANT
CHLORAPREP W/TINT 26ML (MISCELLANEOUS) IMPLANT
CLOTH BEACON ORANGE TIMEOUT ST (SAFETY) ×3 IMPLANT
COVER MAYO STAND STRL (DRAPES) IMPLANT
COVER SURGICAL LIGHT HANDLE (MISCELLANEOUS) ×3 IMPLANT
DRAPE LAPAROSCOPIC ABDOMINAL (DRAPES) ×3 IMPLANT
DRAPE UTILITY 15X26 W/TAPE STR (DRAPE) ×9 IMPLANT
DRAPE WARM FLUID 44X44 (DRAPE) IMPLANT
ELECT BLADE 6.5 EXT (BLADE) IMPLANT
ELECT CAUTERY BLADE 6.4 (BLADE) ×3 IMPLANT
ELECT REM PT RETURN 9FT ADLT (ELECTROSURGICAL) ×3
ELECTRODE REM PT RTRN 9FT ADLT (ELECTROSURGICAL) ×2 IMPLANT
GLOVE BIO SURGEON STRL SZ 6.5 (GLOVE) ×3 IMPLANT
GLOVE BIOGEL PI IND STRL 6.5 (GLOVE) ×2 IMPLANT
GLOVE BIOGEL PI IND STRL 8 (GLOVE) ×2 IMPLANT
GLOVE BIOGEL PI INDICATOR 6.5 (GLOVE) ×1
GLOVE BIOGEL PI INDICATOR 8 (GLOVE) ×1
GLOVE SS BIOGEL STRL SZ 7.5 (GLOVE) ×2 IMPLANT
GLOVE SUPERSENSE BIOGEL SZ 7.5 (GLOVE) ×1
GOWN PREVENTION PLUS XLARGE (GOWN DISPOSABLE) ×3 IMPLANT
GOWN STRL NON-REIN LRG LVL3 (GOWN DISPOSABLE) ×6 IMPLANT
KIT BASIN OR (CUSTOM PROCEDURE TRAY) ×3 IMPLANT
KIT OSTOMY DRAINABLE 2.75 STR (WOUND CARE) ×3 IMPLANT
KIT ROOM TURNOVER OR (KITS) ×3 IMPLANT
LIGASURE IMPACT 36 18CM CVD LR (INSTRUMENTS) IMPLANT
NS IRRIG 1000ML POUR BTL (IV SOLUTION) ×6 IMPLANT
PACK GENERAL/GYN (CUSTOM PROCEDURE TRAY) ×3 IMPLANT
PAD ARMBOARD 7.5X6 YLW CONV (MISCELLANEOUS) ×3 IMPLANT
PAD SHARPS MAGNETIC DISPOSAL (MISCELLANEOUS) IMPLANT
SPECIMEN JAR X LARGE (MISCELLANEOUS) IMPLANT
SPONGE ABDOMINAL VAC ABTHERA (MISCELLANEOUS) ×3 IMPLANT
SPONGE LAP 18X18 X RAY DECT (DISPOSABLE) IMPLANT
STAPLER VISISTAT 35W (STAPLE) IMPLANT
SUCTION POOLE TIP (SUCTIONS) ×3 IMPLANT
SUT NOVA 1 T20/GS 25DT (SUTURE) ×3 IMPLANT
SUT NOVA NAB GS-21 0 18 T12 DT (SUTURE) IMPLANT
SUT PDS AB 1 CTX 36 (SUTURE) IMPLANT
SUT SILK 2 0 SH CR/8 (SUTURE) IMPLANT
SUT SILK 2 0 TIES 10X30 (SUTURE) IMPLANT
SUT SILK 3 0 SH CR/8 (SUTURE) IMPLANT
SUT SILK 3 0 TIES 10X30 (SUTURE) IMPLANT
SYR BULB IRRIGATION 50ML (SYRINGE) IMPLANT
TOWEL OR 17X24 6PK STRL BLUE (TOWEL DISPOSABLE) ×3 IMPLANT
TOWEL OR 17X26 10 PK STRL BLUE (TOWEL DISPOSABLE) ×3 IMPLANT
TOWEL OR NON WOVEN STRL DISP B (DISPOSABLE) ×3 IMPLANT
TRAY FOLEY CATH 14FRSI W/METER (CATHETERS) IMPLANT
WATER STERILE IRR 1000ML POUR (IV SOLUTION) IMPLANT
YANKAUER SUCT BULB TIP NO VENT (SUCTIONS) IMPLANT

## 2012-03-29 NOTE — Anesthesia Preprocedure Evaluation (Addendum)
Anesthesia Evaluation  Patient identified by MRN, date of birth, ID band Patient awake    Reviewed: Allergy & Precautions, H&P , NPO status , Patient's Chart, lab work & pertinent test results  Airway Mallampati: II TM Distance: >3 FB Neck ROM: Full    Dental  (+) Teeth Intact and Dental Advisory Given   Pulmonary          Cardiovascular Rhythm:Regular Rate:Normal     Neuro/Psych    GI/Hepatic PUD,   Endo/Other    Renal/GU CRFRenal disease     Musculoskeletal   Abdominal   Peds  Hematology   Anesthesia Other Findings   Reproductive/Obstetrics                          Anesthesia Physical Anesthesia Plan  ASA: III  Anesthesia Plan: General   Post-op Pain Management:    Induction: Intravenous, Rapid sequence and Cricoid pressure planned  Airway Management Planned: Oral ETT  Additional Equipment:   Intra-op Plan:   Post-operative Plan: Possible Post-op intubation/ventilation  Informed Consent: I have reviewed the patients History and Physical, chart, labs and discussed the procedure including the risks, benefits and alternatives for the proposed anesthesia with the patient or authorized representative who has indicated his/her understanding and acceptance.     Plan Discussed with: CRNA and Surgeon  Anesthesia Plan Comments: (Pt very sedated. Has NGtube in place. 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 Will need ETTube to protect his airway perioperatively. Would he no behnefit from remaining intubated till his belly is finally closed? Plan: ETT for airway management. Lillia Abed MD)        Anesthesia Quick Evaluation

## 2012-03-29 NOTE — Progress Notes (Signed)
PULMONARY  / CRITICAL CARE MEDICINE  Name: JOHNTHAN MELGAREJO MRN: [id number] DOB: 10/11/1960    ADMISSION DATE:  02/26/2012 CONSULTATION DATE:  2/5  REFERRING MD :  Karilyn Cota  CHIEF COMPLAINT:  Acute resp failure   BRIEF PATIENT DESCRIPTION:  42 YOM admitted to [facility] for Cdiff on 2/2. Underwent decompressive cecostomy 2/3, initially was better, then worsened on 2/4 with increased abdominal distension and tenderness, decreased renal function.  He was taken back to the OR on 2/4 for total abdominal colectomy and end ileostomy.  Post op he developed ARDS and septic shock and was being treated for this.  The morning of 2/6 he had new onset of A-fib and transferred to [facility].   Course complicated by severe hypoalbuminemia, elevated LFTs  -cholestatic without biliary obstruction & pre-renal failure. Hypotensive 3/2 am requiring albumin  SIGNIFICANT EVENTS / STUDIES:  2/2 Admit to [facility] with C diff, toxic megacolon 2/3 Decompressive Cecostomy 2/4 Total abdominal colectomy and end ileostomy 2/6 New A-fib and transfer to [facility] health. 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 2/10 CT abd/pelvis: small hematoma likely subcapsular spleen, JPs wnl 2/11 trial TFs  2/11 UE venous dopplers: Findings consistent with superficial vein thrombosis involving the right basilic vein 2/14 LE venous dopplers: No evidence of deep vein thrombosis 2/14 LUE venous doppler: No evidence of deep vein or superficial thrombosis involving the left upper extremity  2/15 neg 4 l bal on lasix drip, lasix d/c  2/16 poor neuro status, CT head: [facility] 2/17 CRRT initiated, high pressor needs 2/17 RUQ [place]: Gallbladder wall thickening in the absence of sludge or stones but no biliary ductal dilatation 2/18 reduced vasopressor reqts 2/19 New onset thrombocytopenia. Heparin D/C'd. Argatroban  initiated 2/19 [place] abd (repeat): No explanation for elevated liver function tests 2/20 reduction pressors, improved alertness, extubated 2/20 LE venous dopplers: No evidence of deep vein or superficial thrombosis 2/21 bleeding overnight from JP's, argatroban turned off, lower pressor needs 2/22 off pressors, on RA, converted to NSR 2/22 Carotid dopplers: No significant extracranial carotid artery stenosis demonstrated 2/25 Cystoscopy with bilateral retrograde pyelograms. No extravasation of contrast from either ureteral orifice. No evidence of hydroureteronephrosis. Unremarkable urinary bladder. B ureteral stents placed 2/27 MRI brain: Scattered small acute to subacute infarcts in both cerebral hemispheres, brainstem, and right cerebellum. Favor sequelae of emboli from a cardiac or proximal aortic source. Main alternate consideration is hypotensive episode 2/27 MRCP: No intrahepatic bile duct dilatation or common bile duct dilatation. No gallstones identified. Large subcapsular fluid collection encases the spleen. This is of indeterminate etiology. This may represent a subacute to chronic subcapsular hematoma or abscess. 2/28 SLP eval: FEES performed. Oropharyngeal weakness noted. Recommend replacement of NGT to supplement PO intake 3/1 Renal signed off 3/3- bowel evisceration, to OR 3/3- no perf / abscess, wound vac open could not close, Liver BX, some oozing, post op resp failure 3/3 echo repeat - improved ef55%, poor windows 3/4- low dose pressor needs 3/4- extubated, high output again from JP's 3/6- OR trip, partial closure wound   LINES / TUBES: ETT ([facility]) 2/4>>> 2/20 PICC ([facility]) 2/4>>>2/11 Art Line ([facility]) 2/5>>>out L IJ CVL 2/06 >> out A line R fem 2/18>>>2/24 Rt ij HD 2/17 >> out Left IJ 2/18 >>  ETT 3/3>>>  CULTURES: BCx2 2/4>>>negative BCx2 2/3>>>negative UC 2/3- Negative MRSA PCR 2/3- Negative C diff 2/6>>>Positive Body fluid  2/9>>>WBCs, no organisms 2/15 BC x 2 >>  NEG 2/15 Sputum >>>NF 3/2 bc>>> 3/2 urine>>> 3/2 sputum>>> 3/5 cdiff>>>  PATH: 3/3 liver bx>>>BENIGN LIVER WITH CHOLESTASIS, The biopsy findings are that of extrahepatic biliary obstruction. No noted primary sclerosing cholangitis.  ANTIBIOTICS: Flagyl 2/3>> d/c'd Vanc 2/6 (IV) >>>2/7 azactam 2/6>>>2/7 Oral vanc 2/6>>>off IV vanc 2/16 >2/24 Aztreonam 2/16 >2/17 Imipenem 2/17>>>2/24 mycofungin 2/18>>>2/24  cipro 3/3>>>3/6 azactam 3/2>>>3/3 vanc 3/2>>>3/5 Flagyl 3/3>>>3/5  SUBJECTIVE:  OR trip  VITAL SIGNS: Temp:  [97 F (36.1 C)-97.6 F (36.4 C)] 97 F (36.1 C) (03/06 1200) Pulse Rate:  [59-84] 66 (03/06 1200) Resp:  [7-26] 7 (03/06 1200) BP: (91-114)/(54-74) 98/67 mmHg (03/06 1150) SpO2:  [94 %-100 %] 100 % (03/06 1200) Weight:  [78.6 kg (173 lb 4.5 oz)] 78.6 kg (173 lb 4.5 oz) (03/06 0248)   INTAKE / OUTPUT: Intake/Output     03/05 0701 - 03/06 0700 03/06 0701 - 03/07 0700   I.V. (mL/kg) 1075 (13.7) 400 (5.1)   Blood     NG/GT 610    IV Piggyback 550 200   Total Intake(mL/kg) 2235 (28.4) 600 (7.6)   Urine (mL/kg/hr) 900 (0.5) 425 (0.8)   Drains 1950 (1) 150 (0.3)   Stool     Total Output 2850 575   Net -615 +25        Urine Occurrence 1400 x      PHYSICAL EXAMINATION: General Icteric, Neuro: follows commands, more awake HEENT: Sclericterus, jvd up Cardiovascular: RRR  s1 s2 Lungs: reduced bases, good air entry overall Abdomen: soft, NT, NO BS, no r Musculoskeletal: 2+ LE edema  Skin: rash improved / resolved  LABS: BMET    Component Value Date/Time   NA 143 03/29/2012 0420   K 3.9 03/29/2012 0420   CL 114* 03/29/2012 0420   CO2 21 03/29/2012 0420   GLUCOSE 114* 03/29/2012 0420   BUN 79* 03/29/2012 0420   CREATININE 1.77* 03/29/2012 0420   CALCIUM 7.9* 03/29/2012 0420   GFRNONAA 42* 03/29/2012 0420   GFRAA 49* 03/29/2012 0420    Hepatic Function Panel     Component Value Date/Time   PROT 3.9* 03/28/2012 0500   ALBUMIN 2.7* 03/28/2012 0500   AST 82*  03/28/2012 0500   ALT 111* 03/28/2012 0500   ALKPHOS 512* 03/28/2012 0500   BILITOT 13.8* 03/28/2012 0930   BILIDIR 10.8* 03/28/2012 0930   IBILI 3.0* 03/28/2012 0930    CBC    Component Value Date/Time   WBC 6.2 03/29/2012 0420   RBC 3.02* 03/29/2012 0420   HGB 8.9* 03/29/2012 0420   HCT 26.6* 03/29/2012 0420   PLT 60* 03/29/2012 0420   MCV 88.1 03/29/2012 0420   MCH 29.5 03/29/2012 0420   MCHC 33.5 03/29/2012 0420   RDW 26.2* 03/29/2012 0420   LYMPHSABS 0.3* 03/29/2012 0420   MONOABS 0.2 03/29/2012 0420   EOSABS 0.0 03/29/2012 0420   BASOSABS 0.0 03/29/2012 0420      Recent Labs Lab 03/29/12 0003 03/29/12 0425 03/29/12 0758 03/29/12 1131 03/29/12 1247  GLUCAP 131* 115* 108* 106* 94    No results found.   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 Sedation for vent 3/3 -int fent  as goal -upright -abdo remains open, limiting PT for now    Severe muscle deconditioning   Shoulder subluxation, right - Cont PT/OT discuss R shoulder subluxation with Ortho -OT when able  GI Wound evisceration    Hyperbilirubinemia, due to cholestasis - Improved mild, AT risk   Dysphagia due to oropharyngeal weakness -Liver bx noted, repeat direct, if remains conjugates, will d/w surgery to place drain vs cheolecystectomy as HIDA likely not helpful -vac change done, partial closure done in OR -restart TF trickle done post op    Thrombocytopenia, unspecified, HIT panel negative    Coagulopathy - likely due to hepatic dysfunction  Anemia, mild. MIld OR loss -scd -fibrinogen wnl, await factor 8 -allow neg baalnce, cbc is concetrating  Pulm    Pleural effusion, L > R Post op resp failure -extubated -IS when able -re  asess post OR trip  Renal   ARF (acute renal failure), Cr leveling off -?now CKD   Hypernatremia, improving slowly hypoK -add d5w -free water, dc Chem in am Allow neg balance  Endocrine    Relative adrenal insufficiency taper to q12h  ID Resolved cdiff with MODS Bowel evisceration NO new source IDentified, likely was sirs with evisc -no new source identified , dc cipro, especially with cdiff prior  Resolved Problems:    history of UC (ulcerative colitis)   Atrial fibrillation, resolved   Acute respiratory failure, resolved   Septic shock, resolved   RUE DVT related to PICC, resolved   Hypokalemia, resolved   Hyperglycemia, resolved   Acute encephalopathy, resolved  Will update wife   Mcarthur Rossetti. Tyson Alias, MD, FACP Pgr: [phone] [facility]

## 2012-03-29 NOTE — Preoperative (Signed)
Beta Blockers   Reason not to administer Beta Blockers:Not Applicable 

## 2012-03-29 NOTE — Op Note (Signed)
Preoperative Diagnosis: status post colectomy with wound dehiscence and open abdomen  Postoprative Diagnosis: Same  Procedure: Procedure(s): EXPLORATORY LAPAROTOMY Open ABDOMINAL VACUUM  CHANGE Partial wound closure   Surgeon: Excell Seltzer T   Assistants: None  Anesthesia:  General endotracheal anesthesiaDiagnos  Indications:   Patient is several weeks status post emergency total of double colectomy for C. Difficile colitis with history of ulcerative colitis. He has had multiple postoperative complications including a complete wound dehiscence and evisceration treated with abdominal vac placement 3 days ago. He now is returned to the operating room for replacement of his abdominal vac dressing.  Procedure Detail:  Patient is brought to the operating room, placed in the supine position on the operating table, and general endotracheal anesthesia was induced. The previous outer vac dressing and sponge was removed leaving the inner sponge. The abdomen was widely sterilely prepped and draped. Patient timeout was performed the correct procedure verified. The inner sponge and the abdominal portion of the VAC dressing was removed. The abdominal cavity was irrigated and the exposed loops of bowel appeared healthy. I did not actively disturb the more lateral loops of bowel. His ileostomy appeared reasonably healthy. The abdomen was thoroughly irrigated. There was less tension in the abdominal wall in 3 days previously and I partially closed the fascia placing 3 #1 Novafil sutures inferiorly and 2 superiorly to decrease the size of the wound but any further closure seemed to create too much tension on healthy appearing fascia. A new abdominal VAC dressing was trimmed to size with a slit for the ileostomy it was placed intra-abdominally covering all the loops of bowel. An outer sponge was placed and then the seal and the VAC applied. An opening in the seal was created for the ileostomy a new device  applied. The patient was taken to the recovery room in stable condition.   Estimated Blood Loss:  None         Drains: abdominal VAC  Complications:  * No complications entered in OR log *         Disposition: PACU - hemodynamically stable.         Condition: stable

## 2012-03-29 NOTE — Anesthesia Postprocedure Evaluation (Signed)
Anesthesia Post Note  Patient: Connor Small  Procedure(s) Performed: Procedure(s) (LRB): EXPLORATORY LAPAROTOMY, PARTIAL WOUND CLOSURE (N/A) Open ABDOMINAL VACUUM  CHANGE (N/A)  Anesthesia type: general  Patient location: PACU  Post pain: Pain level controlled  Post assessment: Patient's Cardiovascular Status Stable  Last Vitals:  Filed Vitals:   03/29/12 1400  BP:   Pulse: 73  Temp:   Resp: 10    Post vital signs: Reviewed and stable  Level of consciousness: sedated  Complications: No apparent anesthesia complications

## 2012-03-29 NOTE — Anesthesia Procedure Notes (Signed)
Procedure Name: Intubation Date/Time: 03/29/2012 10:15 AM Performed by: Sampson Si E Pre-anesthesia Checklist: Patient identified, Timeout performed, Emergency Drugs available, Suction available and Patient being monitored Patient Re-evaluated:Patient Re-evaluated prior to inductionOxygen Delivery Method: Circle system utilized Preoxygenation: Pre-oxygenation with 100% oxygen Intubation Type: IV induction, Rapid sequence and Cricoid Pressure applied Laryngoscope Size: Mac and 3 Grade View: Grade II Tube type: Oral Tube size: 7.5 mm Number of attempts: 1 Airway Equipment and Method: Stylet Placement Confirmation: ETT inserted through vocal cords under direct vision,  breath sounds checked- equal and bilateral and positive ETCO2 Secured at: 24 cm Tube secured with: Tape Dental Injury: Teeth and Oropharynx as per pre-operative assessment

## 2012-03-29 NOTE — Transfer of Care (Signed)
Immediate Anesthesia Transfer of Care Note  Patient: Connor Small  Procedure(s) Performed: Procedure(s): EXPLORATORY LAPAROTOMY (N/A) Open ABDOMINAL VACUUM  CHANGE (N/A)  Patient Location: PACU  Anesthesia Type:General  Level of Consciousness: oriented, lethargic and responds to stimulation  Airway & Oxygen Therapy: Patient Spontanous Breathing and Patient connected to nasal cannula oxygen  Post-op Assessment: Report given to PACU RN  Post vital signs: Reviewed and stable  Complications: No apparent anesthesia complications

## 2012-03-29 NOTE — Progress Notes (Signed)
PT Cancellation Note  Patient Details Name: Connor Small MRN: [id number] DOB: 06/19/1960   Cancelled Treatment:    Reason Eval/Treat Not Completed: Medical issues which prohibited therapy;Patient at procedure or test/unavailable. Surgery for abdominal closure. 03/29/2012  Donnella Sham, [name] [phone] (pager)   [place], Tessie Fass 03/29/2012, 10:47 AM

## 2012-03-30 DIAGNOSIS — M79609 Pain in unspecified limb: Secondary | ICD-10-CM

## 2012-03-30 LAB — CBC WITH DIFFERENTIAL/PLATELET
Basophils Relative: 0 % (ref 0–1)
Eosinophils Absolute: 0.1 10*3/uL (ref 0.0–0.7)
HCT: 30.9 % — ABNORMAL LOW (ref 39.0–52.0)
Hemoglobin: 9.9 g/dL — ABNORMAL LOW (ref 13.0–17.0)
Lymphocytes Relative: 5 % — ABNORMAL LOW (ref 12–46)
Lymphs Abs: 0.3 10*3/uL — ABNORMAL LOW (ref 0.7–4.0)
MCH: 29.6 pg (ref 26.0–34.0)
MCHC: 32 g/dL (ref 30.0–36.0)
MCV: 92.2 fL (ref 78.0–100.0)
Neutro Abs: 5.8 10*3/uL (ref 1.7–7.7)
RDW: 26.2 % — ABNORMAL HIGH (ref 11.5–15.5)

## 2012-03-30 LAB — GLUCOSE, CAPILLARY
Glucose-Capillary: 110 mg/dL — ABNORMAL HIGH (ref 70–99)
Glucose-Capillary: 105 mg/dL — ABNORMAL HIGH (ref 70–99)

## 2012-03-30 LAB — BASIC METABOLIC PANEL
BUN: 73 mg/dL — ABNORMAL HIGH (ref 6–23)
CO2: 20 mEq/L (ref 19–32)
Chloride: 113 mEq/L — ABNORMAL HIGH (ref 96–112)
Creatinine, Ser: 1.65 mg/dL — ABNORMAL HIGH (ref 0.50–1.35)
GFR calc Af Amer: 53 mL/min — ABNORMAL LOW (ref >90)
Glucose, Bld: 111 mg/dL — ABNORMAL HIGH (ref 70–99)
Potassium: 4 mEq/L (ref 3.5–5.1)

## 2012-03-30 MED ORDER — FENTANYL CITRATE 0.05 MG/ML IJ SOLN
25.0000 ug | INTRAMUSCULAR | Status: DC | PRN
  Administered 2012-03-30 – 2012-04-01 (×17): 25 ug via INTRAVENOUS
  Filled 2012-03-30 (×16): qty 2

## 2012-03-30 MED ORDER — TRAMADOL HCL 50 MG PO TABS
25.0000 mg | ORAL_TABLET | Freq: Four times a day (QID) | ORAL | Status: DC
  Administered 2012-03-30 – 2012-04-01 (×7): 25 mg via ORAL
  Filled 2012-03-30 (×12): qty 1

## 2012-03-30 MED ORDER — HYDROCORTISONE SOD SUCCINATE 100 MG IJ SOLR
25.0000 mg | Freq: Two times a day (BID) | INTRAMUSCULAR | Status: DC
  Administered 2012-03-30 – 2012-03-31 (×2): 25 mg via INTRAVENOUS
  Filled 2012-03-30 (×2): qty 0.5

## 2012-03-30 NOTE — Progress Notes (Signed)
Speech Language Pathology Dysphagia Treatment Patient Details Name: Connor Small MRN: [id number] DOB: May 18, 1960 Today's Date: 03/30/2012 Time: 6222-9798 SLP Time Calculation (min): 15 min  Assessment / Plan / Recommendation Clinical Impression  Pt cleared by MD to resume PO trials. Pt still with NG in place. Pt fully alert, more so than prior to surgery. SLP completed oral care, pt was able to tolerate 3 ice chip trials,  intermittently expectorating mucous with strong throat clear. Pt completed instruction to tuck chin and swallow 2 times. Pt reported fatigue after 2 trials, coughing increased. Recommend pt resume ice chips throughout the day following oral care. Continue NG tube feedings. SLP will reassess ability to consume PO on Monday.     Diet Recommendation  Continue with Current Diet: NPO    SLP Plan Continue with current plan of care   Pertinent Vitals/Pain NA   Swallowing Goals  SLP Swallowing Goals Goal #4: Pt will consume ice chips with swallow within 7 seconds of oral administration, 2 effortful swallows with max verbal cues.  Swallow Study Goal #4 - Progress: Progressing toward goal  General Temperature Spikes Noted: No Respiratory Status: Supplemental O2 delivered via (comment) Behavior/Cognition: Alert;Cooperative;Requires cueing Oral Cavity - Dentition: Adequate natural dentition Patient Positioning: Upright in bed  Oral Cavity - Oral Hygiene Does patient have any of the following "at risk" factors?: Oxygen therapy - cannula, mask, simple oxygen devices Patient is HIGH RISK - Oral Care Protocol followed (see row info): Yes   Dysphagia Treatment Treatment focused on: Upgraded PO texture trials;Patient/family/caregiver education;Facilitation of pharyngeal phase Treatment Methods/Modalities: Skilled observation Patient observed directly with PO's: Yes Type of PO's observed: Ice chips Feeding: Total assist Liquids provided via: Teaspoon Pharyngeal Phase Signs &  Symptoms: Delayed throat clear;Wet vocal quality Type of cueing: Verbal;Tactile;Visual Amount of cueing: Moderate   GO    [name], [place] CCC-SLP [phone]  [name] 03/30/2012, 4:10 PM

## 2012-03-30 NOTE — Progress Notes (Signed)
Patient interviewed and examined, agree with resident note above.  Edward Jolly MD, FACS  03/30/2012 4:06 PM

## 2012-03-30 NOTE — Progress Notes (Signed)
PULMONARY  / CRITICAL CARE MEDICINE  Name: Connor Small MRN: [id number] DOB: 08/19/1960    ADMISSION DATE:  02/26/2012 CONSULTATION DATE:  2/5  REFERRING MD :  Karilyn Cota  CHIEF COMPLAINT:  Acute resp failure   BRIEF PATIENT DESCRIPTION:  3 YOM admitted to [facility] for Cdiff on 2/2. Underwent decompressive cecostomy 2/3, initially was better, then worsened on 2/4 with increased abdominal distension and tenderness, decreased renal function.  He was taken back to the OR on 2/4 for total abdominal colectomy and end ileostomy.  Post op he developed ARDS and septic shock and was being treated for this.  The morning of 2/6 he had new onset of A-fib and transferred to [facility].   Course complicated by severe hypoalbuminemia, elevated LFTs  -cholestatic without biliary obstruction & pre-renal failure. Hypotensive 3/2 am requiring albumin  SIGNIFICANT EVENTS / STUDIES:  2/2 Admit to [facility] with C diff, toxic megacolon 2/3 Decompressive Cecostomy 2/4 Total abdominal colectomy and end ileostomy 2/6 New A-fib and transfer to [facility] health. 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 2/10 CT abd/pelvis: small hematoma likely subcapsular spleen, JPs wnl 2/11 trial TFs  2/11 UE venous dopplers: Findings consistent with superficial vein thrombosis involving the right basilic vein 2/14 LE venous dopplers: No evidence of deep vein thrombosis 2/14 LUE venous doppler: No evidence of deep vein or superficial thrombosis involving the left upper extremity  2/15 neg 4 l bal on lasix drip, lasix d/c  2/16 poor neuro status, CT head: [facility] 2/17 CRRT initiated, high pressor needs 2/17 RUQ [place]: Gallbladder wall thickening in the absence of sludge or stones but no biliary ductal dilatation 2/18 reduced vasopressor reqts 2/19 New onset thrombocytopenia. Heparin D/C'd. Argatroban  initiated 2/19 [place] abd (repeat): No explanation for elevated liver function tests 2/20 reduction pressors, improved alertness, extubated 2/20 LE venous dopplers: No evidence of deep vein or superficial thrombosis 2/21 bleeding overnight from JP's, argatroban turned off, lower pressor needs 2/22 off pressors, on RA, converted to NSR 2/22 Carotid dopplers: No significant extracranial carotid artery stenosis demonstrated 2/25 Cystoscopy with bilateral retrograde pyelograms. No extravasation of contrast from either ureteral orifice. No evidence of hydroureteronephrosis. Unremarkable urinary bladder. B ureteral stents placed 2/27 MRI brain: Scattered small acute to subacute infarcts in both cerebral hemispheres, brainstem, and right cerebellum. Favor sequelae of emboli from a cardiac or proximal aortic source. Main alternate consideration is hypotensive episode 2/27 MRCP: No intrahepatic bile duct dilatation or common bile duct dilatation. No gallstones identified. Large subcapsular fluid collection encases the spleen. This is of indeterminate etiology. This may represent a subacute to chronic subcapsular hematoma or abscess. 2/28 SLP eval: FEES performed. Oropharyngeal weakness noted. Recommend replacement of NGT to supplement PO intake 3/1 Renal signed off 3/3- bowel evisceration, to OR 3/3- no perf / abscess, wound vac open could not close, Liver BX, some oozing, post op resp failure 3/3 echo repeat - improved ef55%, poor windows 3/4- low dose pressor needs 3/4- extubated, high output again from JP's 3/6- OR trip, partial closure wound   LINES / TUBES: ETT ([facility]) 2/4>>> 2/20 PICC ([facility]) 2/4>>>2/11 Art Line ([facility]) 2/5>>>out L IJ CVL 2/06 >> out A line R fem 2/18>>>2/24 Rt ij HD 2/17 >> out Left IJ 2/18 >>  ETT 3/3>>>  CULTURES: BCx2 2/4>>>negative BCx2 2/3>>>negative UC 2/3- Negative MRSA PCR 2/3- Negative C diff 2/6>>>Positive Body fluid  2/9>>>WBCs, no organisms 2/15 BC x 2 >>  NEG 2/15 Sputum >>>NF 3/2 bc>>> 3/2 urine>>> 3/2 sputum>>> 3/5 cdiff>>>neg  PATH: 3/3 liver bx>>>BENIGN LIVER WITH CHOLESTASIS, The biopsy findings are that of extrahepatic biliary obstruction. No noted primary sclerosing cholangitis.  ANTIBIOTICS: Flagyl 2/3>> d/c'd Vanc 2/6 (IV) >>>2/7 azactam 2/6>>>2/7 Oral vanc 2/6>>>off IV vanc 2/16 >2/24 Aztreonam 2/16 >2/17 Imipenem 2/17>>>2/24 mycofungin 2/18>>>2/24  cipro 3/3>>>3/6 azactam 3/2>>>3/3 vanc 3/2>>>3/5 Flagyl 3/3>>>3/5  SUBJECTIVE:  Continued to improve renal fxn, pain increased  VITAL SIGNS: Temp:  [97 F (36.1 C)-97.6 F (36.4 C)] 97.2 F (36.2 C) (03/07 0401) Pulse Rate:  [60-94] 89 (03/07 0600) Resp:  [7-22] 13 (03/07 0600) BP: (89-106)/(54-71) 93/62 mmHg (03/07 0600) SpO2:  [92 %-100 %] 96 % (03/07 0600) Weight:  [78.5 kg (173 lb 1 oz)] 78.5 kg (173 lb 1 oz) (03/07 0500)   INTAKE / OUTPUT: Intake/Output     03/06 0701 - 03/07 0700 03/07 0701 - 03/08 0700   I.V. (mL/kg) 1500 (19.1)    NG/GT 310    IV Piggyback 200    Total Intake(mL/kg) 2010 (25.6)    Urine (mL/kg/hr) 1600 (0.8)    Drains 950 (0.5)    Total Output 2550     Net -540            PHYSICAL EXAMINATION: General Icteric, Neuro: follows commands working with pt HEENT: Sclericterus, jvd wnl Cardiovascular: RRR  s1 s2 Lungs: CTA, no distress Abdomen: soft, NT, NO BS, no r Musculoskeletal: 2+ LE edema  Skin: rash improved / resolved  LABS: BMET    Component Value Date/Time   NA 144 03/30/2012 0428   K 4.0 03/30/2012 0428   CL 113* 03/30/2012 0428   CO2 20 03/30/2012 0428   GLUCOSE 111* 03/30/2012 0428   BUN 73* 03/30/2012 0428   CREATININE 1.65* 03/30/2012 0428   CALCIUM 8.0* 03/30/2012 0428   GFRNONAA 46* 03/30/2012 0428   GFRAA 53* 03/30/2012 0428    Hepatic Function Panel     Component Value Date/Time   PROT 3.9* 03/28/2012 0500   ALBUMIN 2.7* 03/28/2012 0500   AST 82* 03/28/2012 0500   ALT 111* 03/28/2012 0500   ALKPHOS 512* 03/28/2012 0500    BILITOT 13.8* 03/28/2012 0930   BILIDIR 10.8* 03/28/2012 0930   IBILI 3.0* 03/28/2012 0930    CBC    Component Value Date/Time   WBC 6.5 03/30/2012 0428   RBC 3.35* 03/30/2012 0428   HGB 9.9* 03/30/2012 0428   HCT 30.9* 03/30/2012 0428   PLT 58* 03/30/2012 0428   MCV 92.2 03/30/2012 0428   MCH 29.6 03/30/2012 0428   MCHC 32.0 03/30/2012 0428   RDW 26.2* 03/30/2012 0428   LYMPHSABS 0.3* 03/30/2012 0428   MONOABS 0.3 03/30/2012 0428   EOSABS 0.1 03/30/2012 0428   BASOSABS 0.0 03/30/2012 0428      Recent Labs Lab 03/29/12 1610 03/29/12 1950 03/30/12 0014 03/30/12 0400 03/30/12 0746  GLUCAP 80 105* 90 120* 110*    No results found.   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 Sedation for vent 3/3 Pain control  -PT -fent to q12h  -continue non narcotic  maintenance options - tramadol (low dose with liver dz)    Severe muscle deconditioning   Shoulder subluxation, right - Cont PT/OT discuss R shoulder subluxation with Ortho -OT re involvement -PT  GI Wound evisceration    Hyperbilirubinemia, due to cholestasis - Improved mild, AT risk   Dysphagia due to oropharyngeal weakness -Liver bx noted, repeat direct, if remains conjugates, will d/w surgery to place drain vs cheolecystectomy as HIDA likely not helpful -vac change done, partial closure done in OR, repeat plan 3/9 -restart TF trickle done post op -repeat SLP -bili in am     Thrombocytopenia, unspecified, HIT panel negative    Coagulopathy - likely due to hepatic dysfunction  Anemia, hemoconcentration well -scd -fibrinogen wnl, WNL factor 8 - NO DIC -allow neg baalnce, cbc is concetrating  Pulm    Pleural  effusion, L > R Post op resp failure -IS when able  Renal   ARF (acute renal failure), Cr leveling off -?now CKD   Hypernatremia, improving slowly hypoK -add d5w Chem in am Allow neg balance daily, done well  Endocrine    Relative adrenal insufficiency taper to q12h to 25 mg  ID Resolved cdiff with MODS Bowel evisceration NO new source IDentified, likely was sirs with evisc -cdiff repeat neg, no role repeat this test now  To  2600  Resolved Problems:    history of UC (ulcerative colitis)   Atrial fibrillation, resolved   Acute respiratory failure, resolved   Septic shock, resolved   RUE DVT related to PICC, resolved   Hypokalemia, resolved   Hyperglycemia, resolved   Acute encephalopathy, resolved  Will update wife   Mcarthur Rossetti. Tyson Alias, MD, FACP Pgr: [phone] [facility]

## 2012-03-30 NOTE — Progress Notes (Signed)
Clinical Social Worker reviewed chart and staffed case with RNCM.  CSW continues to follow and assist as needed.   Dala Dock, MSW, [place]

## 2012-03-30 NOTE — Progress Notes (Signed)
Patient ID: Connor Small, male   DOB: 12-Aug-1960, 52 y.o.   MRN: [id number] 1 Day Post-Op  Subjective: Doing well but will continued pain.  Requesting increased frequency of meds for nursing interventions.  Extubated following surgery.  Objective: Vital signs in last 24 hours: Temp:  [97 F (36.1 C)-97.6 F (36.4 C)] 97.2 F (36.2 C) (03/07 0401) Pulse Rate:  [60-94] 89 (03/07 0600) Resp:  [7-22] 13 (03/07 0600) BP: (89-111)/(54-74) 93/62 mmHg (03/07 0600) SpO2:  [92 %-100 %] 96 % (03/07 0600) Weight:  [173 lb 1 oz (78.5 kg)] 173 lb 1 oz (78.5 kg) (03/07 0500) Last BM Date: 03/29/12  Intake/Output from previous day: 03/06 0701 - 03/07 0700 In: 2010 [I.V.:1500; NG/GT:310; IV Piggyback:200] Out: 2550 [Urine:1600; Drains:950] Intake/Output this shift:    PE: Abd: +BS, soft, non-distended.  Vac in place. No complications.  Colostomy dark but intact and with dark output. Wound Vac: out put noted, serosanguinous fluid draining well.  Mildly improved output from drain  Lab Results:   Recent Labs  03/29/12 0420 03/30/12 0428  WBC 6.2 6.5  HGB 8.9* 9.9*  HCT 26.6* 30.9*  PLT 60* 58*   BMET  Recent Labs  03/29/12 0420 03/30/12 0428  NA 143 144  K 3.9 4.0  CL 114* 113*  CO2 21 20  GLUCOSE 114* 111*  BUN 79* 73*  CREATININE 1.77* 1.65*  CALCIUM 7.9* 8.0*   PT/INR  Recent Labs  03/29/12 0420  LABPROT 17.0*  INR 1.42   CMP     Component Value Date/Time   NA 144 03/30/2012 0428   K 4.0 03/30/2012 0428   CL 113* 03/30/2012 0428   CO2 20 03/30/2012 0428   GLUCOSE 111* 03/30/2012 0428   BUN 73* 03/30/2012 0428   CREATININE 1.65* 03/30/2012 0428   CALCIUM 8.0* 03/30/2012 0428   PROT 3.9* 03/28/2012 0500   ALBUMIN 2.7* 03/28/2012 0500   AST 82* 03/28/2012 0500   ALT 111* 03/28/2012 0500   ALKPHOS 512* 03/28/2012 0500   BILITOT 13.8* 03/28/2012 0930   GFRNONAA 46* 03/30/2012 0428   GFRAA 53* 03/30/2012 0428   Lipase     Component Value Date/Time   LIPASE 9* 02/26/2012 1409        Studies/Results: No results found. LIVER BIOPSY 03/26/12: - BENIGN Liver with Cholestasis:  Sections show benign liver with cholestatic bile deposition centered in the perivenular area with associated degenerative hepatocyte changes. Bile deposition appears to spread into the mid zone. Although deposition of bile is largely absent in the portal areas, the interface areas show evidence of bile ductule formation. Significant inflammation is not present. Minimal portal fibrosis is identified on trichrome stain. Iron stain show no significant increase in hepatocyte iron. Reticulin stain shows a normal reticulin network. PAS stain shows no evidence of alpha-1 antitrypsin deficiency and no evidence of fungal infection. The biopsy findings are that of extrahepatic biliary obstruction. Evaluation of the biliary system by a modality such as ERCP may be helpful as clinically indicated. As the patient has a known history of ulcerative colitis, assessment of all bile ducts is performed (assessing for the possibility of primary sclerosing cholangitis). However, the assessment is limited as only five portal areas are present for evaluation. With this  Anti-infectives: cipro 3/3>>>  azactam 3/2>>>3/3  vanc 3/2>>>  Flagyl 3/3>>>  Assessment/Plan # 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 # Wound dehiscence with evisceration - post op Day #1 from VAC change and partial closure;  Post Op Day #4 from Exploratory Laparotomy for initial placement of Abdominal VAC # High Output - remains following surgery, likely due to hypoalbuminemia and ascites, no evidence of ureteral injury, slightly improved # Hyperbilirubinemia & Jaundice - s/p intraoperative Liver Biopsy - 03/26/12 - Likely Cholestasis, consider sclerosing cholangitis although less likely - PER GI recomendations # Anemia and Coagulopathy # Colostomy - will need re-eval tomorrow in OR # Multiple other medical problems # S/p  Cystoscopy by Urology without evidence of ureteral injury  Plan: ABX per CCM;  Return to OR on 3/9 for 2nd VAC change.  Hold TFs after midnight and NPO on 3/8 Increased frequency of pain medications - Fentanyl q 2oprn   LOS: 33 days    Andrena Mews, DO [facility] Resident - PGY-2 03/30/2012 8:30 AM Pager: [phone]

## 2012-03-30 NOTE — Progress Notes (Signed)
1700 Patients wife questions why PT/OT has not been in to see him today.  Wife states that PT came in to see prior to lunch and stated that he would be in to work with the patient after lunch.  No one has come in yet.  PT called [phone]) no answer. Paged PT [phone] spoke with Donnella Sham, PT. Stated that he is unaware of anyone speaking with patient or patients family however, will not be able to see patient today due to volume and no staff to send to work with patient.  Explained to therapist that patient has not hand any type of therapy per wife since earlier this week and that his therapy is important.  Notified by therapist that staff should do ROM with patient. Updated therapist that this has been happening by patients wife and that she is very concerned that therapy has not happened for several days.  Also notified that Dr. Titus Mould has requested that therapy be done today.  Patients wife notified that therapy will not be able to work with patient today. Wife states that patient really needs therapy today because he has only had PT once this week and that this is unacceptable. Dr. Titus Mould notified, instructed to page therapist to MDs cell phone number and placed orders for lower extremity dopplers to be done for today.  Dr. Titus Mould spoke with therapist and notified nurse that therapy would be working with patient on 04/01/12. Dopplers called, will be up to perform lower ext doppler study today. Wife made aware and requests to speak with a patient advocate. Information obtained from house coverage (Amy, RN). Director  Lucianne Muss, RN made aware. Information given to wife to call the office of patient experience on Monday to obtain a patient advocate for husband.

## 2012-03-30 NOTE — Progress Notes (Signed)
VASCULAR LAB PRELIMINARY  PRELIMINARY  PRELIMINARY  PRELIMINARY  Bilateral lower extremity venous duplex  completed.    Preliminary report:  Bilateral:  No evidence of DVT, superficial thrombosis, or Baker's Cyst.    CESTONE, HELENE, RVT 03/30/2012, 5:47 PM

## 2012-03-31 ENCOUNTER — Inpatient Hospital Stay (HOSPITAL_COMMUNITY): Payer: BC Managed Care – PPO

## 2012-03-31 LAB — BASIC METABOLIC PANEL
CO2: 20 mEq/L (ref 19–32)
Calcium: 8.1 mg/dL — ABNORMAL LOW (ref 8.4–10.5)
Creatinine, Ser: 1.55 mg/dL — ABNORMAL HIGH (ref 0.50–1.35)
GFR calc non Af Amer: 49 mL/min — ABNORMAL LOW (ref >90)
Glucose, Bld: 102 mg/dL — ABNORMAL HIGH (ref 70–99)

## 2012-03-31 LAB — HEPATIC FUNCTION PANEL
ALT: 145 U/L — ABNORMAL HIGH (ref 0–53)
AST: 131 U/L — ABNORMAL HIGH (ref 0–37)
Total Protein: 3.8 g/dL — ABNORMAL LOW (ref 6.0–8.3)

## 2012-03-31 LAB — CULTURE, BLOOD (ROUTINE X 2): Culture: NO GROWTH

## 2012-03-31 LAB — GLUCOSE, CAPILLARY: Glucose-Capillary: 102 mg/dL — ABNORMAL HIGH (ref 70–99)

## 2012-03-31 LAB — PHOSPHORUS: Phosphorus: 4.4 mg/dL (ref 2.3–4.6)

## 2012-03-31 MED ORDER — HYDROCORTISONE SOD SUCCINATE 100 MG IJ SOLR
25.0000 mg | Freq: Every day | INTRAMUSCULAR | Status: DC
  Administered 2012-03-31: 25 mg via INTRAVENOUS

## 2012-03-31 MED ORDER — VITAL AF 1.2 CAL PO LIQD
1000.0000 mL | ORAL | Status: DC
  Filled 2012-03-31 (×2): qty 1000

## 2012-03-31 NOTE — Progress Notes (Signed)
Patient ID: [name], male   DOB: 12-21-1960, 52 y.o.   MRN: [id number] 2 Days Post-Op  Subjective: Comfortable, denies abd pain  Objective: Vital signs in last 24 hours: Temp:  [97 F (36.1 C)-97.8 F (36.6 C)] 97.6 F (36.4 C) (03/08 0008) Pulse Rate:  [87-108] 105 (03/08 0600) Resp:  [9-24] 17 (03/08 0600) BP: (89-107)/(59-73) 96/71 mmHg (03/08 0600) SpO2:  [96 %-100 %] 98 % (03/08 0600) Weight:  [170 lb 3.1 oz (77.2 kg)] 170 lb 3.1 oz (77.2 kg) (03/08 0500) Last BM Date: 03/30/12  Intake/Output from previous day: 03/07 0701 - 03/08 0700 In: 1590 [I.V.:1200; NG/GT:390] Out: 2400 [Urine:1425; Drains:975] Intake/Output this shift:    General appearance: cooperative, fatigued, no distress and jaundiced Resp: clear to auscultation bilaterally GI: normal findings: soft, non-tender and normal stool per ostomy Incision/Wound: VAC clean and intact  Lab Results:   Recent Labs  03/29/12 0420 03/30/12 0428  WBC 6.2 6.5  HGB 8.9* 9.9*  HCT 26.6* 30.9*  PLT 60* 58*   BMET  Recent Labs  03/30/12 0428 03/31/12 0500  NA 144 143  K 4.0 4.2  CL 113* 114*  CO2 20 20  GLUCOSE 111* 102*  BUN 73* 70*  CREATININE 1.65* 1.55*  CALCIUM 8.0* 8.1*     Studies/Results: No results found.  Anti-infectives: Anti-infectives   Start     Dose/Rate Route Frequency Ordered Stop   03/26/12 2100  vancomycin (VANCOCIN) IVPB 1000 mg/200 mL premix  Status:  Discontinued     1,000 mg 200 mL/hr over 60 Minutes Intravenous Every 24 hours 03/25/12 1934 03/28/12 0913   03/26/12 0945  ciprofloxacin (CIPRO) IVPB 400 mg  Status:  Discontinued     400 mg 200 mL/hr over 60 Minutes Intravenous Every 12 hours 03/26/12 0933 03/29/12 1331   03/26/12 0600  aztreonam (AZACTAM) 1 g in dextrose 5 % 50 mL IVPB  Status:  Discontinued     1 g 100 mL/hr over 30 Minutes Intravenous 3 times per day 03/25/12 1934 03/26/12 0929   03/25/12 2200  metroNIDAZOLE (FLAGYL) IVPB 500 mg  Status:  Discontinued      500 mg 100 mL/hr over 60 Minutes Intravenous Every 8 hours 03/25/12 1909 03/28/12 0913   03/25/12 2000  vancomycin (VANCOCIN) IVPB 1000 mg/200 mL premix     1,000 mg 200 mL/hr over 60 Minutes Intravenous STAT 03/25/12 1933 03/25/12 2054   03/25/12 2000  aztreonam (AZACTAM) 1 g in dextrose 5 % 50 mL IVPB     1 g 100 mL/hr over 30 Minutes Intravenous STAT 03/25/12 1933 03/25/12 2024   03/20/12 1145  ciprofloxacin (CIPRO) IVPB 400 mg    Comments:  ON CALL TO OR   400 mg 200 mL/hr over 60 Minutes Intravenous  Once 03/20/12 1143 03/20/12 1722   03/18/12 1000  vancomycin (VANCOCIN) 1,500 mg in sodium chloride 0.9 % 500 mL IVPB  Status:  Discontinued     1,500 mg 250 mL/hr over 120 Minutes Intravenous Every 48 hours 03/17/12 1920 03/19/12 0840   03/14/12 1000  micafungin (MYCAMINE) 100 mg in sodium chloride 0.9 % 100 mL IVPB  Status:  Discontinued     100 mg 100 mL/hr over 1 Hours Intravenous Daily 03/13/12 0632 03/19/12 0840   03/13/12 1000  micafungin (MYCAMINE) 100 mg in sodium chloride 0.9 % 100 mL IVPB     100 mg 100 mL/hr over 1 Hours Intravenous Daily 03/13/12 0632 03/13/12 1311   03/12/12 1800  vancomycin (VANCOCIN)  IVPB 1000 mg/200 mL premix  Status:  Discontinued     1,000 mg 200 mL/hr over 60 Minutes Intravenous Every 24 hours 03/12/12 0859 03/17/12 1017   03/12/12 1000  imipenem-cilastatin (PRIMAXIN) 500 mg in sodium chloride 0.9 % 100 mL IVPB  Status:  Discontinued     500 mg 200 mL/hr over 30 Minutes Intravenous 3 times per day 03/12/12 0848 03/12/12 0909   03/12/12 1000  imipenem-cilastatin (PRIMAXIN) 250 mg in sodium chloride 0.9 % 100 mL IVPB  Status:  Discontinued     250 mg 200 mL/hr over 30 Minutes Intravenous 4 times per day 03/12/12 0909 03/19/12 0841   03/11/12 1200  vancomycin (VANCOCIN) 750 mg in sodium chloride 0.9 % 150 mL IVPB  Status:  Discontinued     750 mg 150 mL/hr over 60 Minutes Intravenous Every 12 hours 03/11/12 0204 03/12/12 0858   03/11/12 0215   vancomycin (VANCOCIN) 750 mg in sodium chloride 0.9 % 150 mL IVPB     750 mg 150 mL/hr over 60 Minutes Intravenous  Once 03/11/12 0204 03/11/12 0352   03/11/12 0215  aztreonam (AZACTAM) 1 g in dextrose 5 % 50 mL IVPB  Status:  Discontinued     1 g 100 mL/hr over 30 Minutes Intravenous 3 times per day 03/11/12 0204 03/12/12 0851   03/05/12 2200  metroNIDAZOLE (FLAGYL) IVPB 500 mg  Status:  Discontinued    Comments:  First dose ASAP   500 mg 100 mL/hr over 60 Minutes Intravenous Every 8 hours 03/05/12 1346 03/19/12 1509   03/02/12 1200  vancomycin (VANCOCIN) 50 mg/mL oral solution 500 mg  Status:  Discontinued     500 mg Oral 4 times per day 03/02/12 0913 03/19/12 1509   03/01/12 1445  vancomycin (VANCOCIN) 50 mg/mL oral solution 500 mg  Status:  Discontinued     500 mg Oral 3 times per day 03/01/12 1340 03/02/12 0913   03/01/12 1400  aztreonam (AZACTAM) 1 g in dextrose 5 % 50 mL IVPB  Status:  Discontinued     1 g 100 mL/hr over 30 Minutes Intravenous 3 times per day 03/01/12 1356 03/02/12 1146   03/01/12 1300  vancomycin (VANCOCIN) 1,250 mg in sodium chloride 0.9 % 250 mL IVPB  Status:  Discontinued     1,250 mg 166.7 mL/hr over 90 Minutes Intravenous Every 12 hours 03/01/12 1127 03/02/12 1146   02/29/12 0600  ciprofloxacin (CIPRO) IVPB 400 mg     400 mg 200 mL/hr over 60 Minutes Intravenous On call to O.R. 02/28/12 1409 02/28/12 1438   02/28/12 2200  vancomycin (VANCOCIN) 500 mg in sodium chloride irrigation 0.9 % 60 mL ENEMA  Status:  Discontinued     500 mg Rectal 2 times daily 02/28/12 1504 02/28/12 1817   02/28/12 1200  vancomycin (VANCOCIN) 50 mg/mL oral solution 500 mg  Status:  Discontinued    Comments:  First dose ASAP   500 mg Per Tube 4 times per day 02/28/12 0814 02/28/12 1817   02/28/12 1000  vancomycin (VANCOCIN) IVPB 1000 mg/200 mL premix  Status:  Discontinued     1,000 mg 200 mL/hr over 60 Minutes Intravenous Every 12 hours 02/28/12 0845 03/01/12 1127   02/28/12 1000   vancomycin (VANCOCIN) 500 mg in sodium chloride irrigation 0.9 % 100 mL ENEMA  Status:  Discontinued     500 mg Rectal 2 times daily 02/28/12 0923 02/28/12 1504   02/27/12 1600  metroNIDAZOLE (FLAGYL) IVPB 500 mg  Status:  Discontinued  500 mg 100 mL/hr over 60 Minutes Intravenous Every 8 hours 02/27/12 1546 02/27/12 2000   02/27/12 1600  ciprofloxacin (CIPRO) IVPB 400 mg  Status:  Discontinued     400 mg 200 mL/hr over 60 Minutes Intravenous Every 12 hours 02/27/12 1547 02/27/12 2000   02/27/12 1545  metroNIDAZOLE (FLAGYL) IVPB 500 mg  Status:  Discontinued     500 mg 100 mL/hr over 60 Minutes Intravenous  Once 02/27/12 1535 02/28/12 0843   02/27/12 1545  vancomycin (VANCOCIN) 500 mg in sodium chloride 0.9 % 100 mL IVPB  Status:  Discontinued     500 mg 100 mL/hr over 60 Minutes Intravenous  Once 02/27/12 1535 02/27/12 1538   02/27/12 1545  ciprofloxacin (CIPRO) IVPB 400 mg  Status:  Discontinued     400 mg 200 mL/hr over 60 Minutes Intravenous  Once 02/27/12 1538 02/28/12 0843   02/27/12 1415  vancomycin (VANCOCIN) 500 mg in sodium chloride 0.9 % 100 mL IVPB     500 mg 100 mL/hr over 60 Minutes Intravenous  Once 02/27/12 1402 02/27/12 1411   02/27/12 1400  vancomycin (VANCOCIN) powder 500 mg  Status:  Discontinued     500 mg Other To Surgery 02/27/12 1359 02/27/12 1402   02/27/12 1200  metroNIDAZOLE (FLAGYL) IVPB 500 mg  Status:  Discontinued    Comments:  First dose ASAP   500 mg 100 mL/hr over 60 Minutes Intravenous Every 6 hours 02/27/12 0953 03/05/12 1346   02/27/12 1200  vancomycin (VANCOCIN) 50 mg/mL oral solution 250 mg  Status:  Discontinued    Comments:  First dose ASAP   250 mg Oral 4 times per day 02/27/12 0953 02/28/12 0814   02/26/12 1700  oseltamivir (TAMIFLU) capsule 75 mg     75 mg Oral  Once 02/26/12 1651 02/26/12 1715      Assessment/Plan: s/p Procedure(s): EXPLORATORY LAPAROTOMY, PARTIAL WOUND CLOSURE Open ABDOMINAL VACUUM  CHANGE Continued slow  improvement Increase TF to 20cc/hr For VAC change in OR tomorrow   LOS: 34 days    Ziah Turvey T 03/31/2012

## 2012-03-31 NOTE — Progress Notes (Signed)
PULMONARY  / CRITICAL CARE MEDICINE  Name: GARWOOD RUMBLEY MRN: [id number] DOB: Nov 16, 1960    ADMISSION DATE:  02/26/2012 CONSULTATION DATE:  2/5  REFERRING MD :  Karilyn Cota  CHIEF COMPLAINT:  Acute resp failure   BRIEF PATIENT DESCRIPTION:  1 YOM admitted to [facility] for Cdiff on 2/2. Underwent decompressive cecostomy 2/3, initially was better, then worsened on 2/4 with increased abdominal distension and tenderness, decreased renal function.  He was taken back to the OR on 2/4 for total abdominal colectomy and end ileostomy.  Post op he developed ARDS and septic shock and was being treated for this.  The morning of 2/6 he had new onset of A-fib and transferred to [facility].   Course complicated by severe hypoalbuminemia, elevated LFTs  -cholestatic without biliary obstruction & pre-renal failure. Hypotensive 3/2 am requiring albumin  SIGNIFICANT EVENTS / STUDIES:  2/2 Admit to [facility] with C diff, toxic megacolon 2/3 Decompressive Cecostomy 2/4 Total abdominal colectomy and end ileostomy 2/6 New A-fib and transfer to [facility] health. 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 2/10 CT abd/pelvis: small hematoma likely subcapsular spleen, JPs wnl 2/11 trial TFs  2/11 UE venous dopplers: Findings consistent with superficial vein thrombosis involving the right basilic vein 2/14 LE venous dopplers: No evidence of deep vein thrombosis 2/14 LUE venous doppler: No evidence of deep vein or superficial thrombosis involving the left upper extremity  2/15 neg 4 l bal on lasix drip, lasix d/c  2/16 poor neuro status, CT head: [facility] 2/17 CRRT initiated, high pressor needs 2/17 RUQ [place]: Gallbladder wall thickening in the absence of sludge or stones but no biliary ductal dilatation 2/18 reduced vasopressor reqts 2/19 New onset thrombocytopenia. Heparin D/C'd. Argatroban  initiated 2/19 [place] abd (repeat): No explanation for elevated liver function tests 2/20 reduction pressors, improved alertness, extubated 2/20 LE venous dopplers: No evidence of deep vein or superficial thrombosis 2/21 bleeding overnight from JP's, argatroban turned off, lower pressor needs 2/22 off pressors, on RA, converted to NSR 2/22 Carotid dopplers: No significant extracranial carotid artery stenosis demonstrated 2/25 Cystoscopy with bilateral retrograde pyelograms. No extravasation of contrast from either ureteral orifice. No evidence of hydroureteronephrosis. Unremarkable urinary bladder. B ureteral stents placed 2/27 MRI brain: Scattered small acute to subacute infarcts in both cerebral hemispheres, brainstem, and right cerebellum. Favor sequelae of emboli from a cardiac or proximal aortic source. Main alternate consideration is hypotensive episode 2/27 MRCP: No intrahepatic bile duct dilatation or common bile duct dilatation. No gallstones identified. Large subcapsular fluid collection encases the spleen. This is of indeterminate etiology. This may represent a subacute to chronic subcapsular hematoma or abscess. 2/28 SLP eval: FEES performed. Oropharyngeal weakness noted. Recommend replacement of NGT to supplement PO intake 3/1 Renal signed off 3/3- bowel evisceration, to OR 3/3- no perf / abscess, wound vac open could not close, Liver BX, some oozing, post op resp failure 3/3 echo repeat - improved ef55%, poor windows 3/4- low dose pressor needs 3/4- extubated, high output again from JP's 3/6- OR trip, partial closure wound 3/7- renal fxn continues to improve 3/8- doppler legs >>>Prelim NEG  LINES / TUBES: ETT ([facility]) 2/4>>> 2/20 PICC ([facility]) 2/4>>>2/11 Art Line ([facility]) 2/5>>>out L IJ CVL 2/06 >> out A line R fem 2/18>>>2/24 Rt ij HD 2/17 >> out Left IJ 2/18 >>  ETT 3/3>>>out post op x 2  CULTURES: BCx2 2/4>>>negative  BCx2 2/3>>>negative UC 2/3- Negative MRSA PCR 2/3-  Negative C diff 2/6>>>Positive Body fluid 2/9>>>WBCs, no organisms 2/15 BC x 2 >> NEG 2/15 Sputum >>>NF 3/2 bc>>>NGTD 3/2 urine>>>neg 3/2 sputum>>>neg 3/5 cdiff>>>neg  PATH: 3/3 liver bx>>>BENIGN LIVER WITH CHOLESTASIS, The biopsy findings are that of extrahepatic biliary obstruction. No noted primary sclerosing cholangitis.  ANTIBIOTICS: Flagyl 2/3>> d/c'd Vanc 2/6 (IV) >>>2/7 azactam 2/6>>>2/7 Oral vanc 2/6>>>off IV vanc 2/16 >2/24 Aztreonam 2/16 >2/17 Imipenem 2/17>>>2/24 mycofungin 2/18>>>2/24  cipro 3/3>>>3/6 azactam 3/2>>>3/3 vanc 3/2>>>3/5 Flagyl 3/3>>>3/5  SUBJECTIVE:  Birthday today, spirits improved  VITAL SIGNS: Temp:  [97 F (36.1 C)-97.8 F (36.6 C)] 97.6 F (36.4 C) (03/08 0008) Pulse Rate:  [87-108] 105 (03/08 0600) Resp:  [9-24] 17 (03/08 0600) BP: (89-107)/(59-73) 96/71 mmHg (03/08 0600) SpO2:  [96 %-100 %] 98 % (03/08 0600) Weight:  [77.2 kg (170 lb 3.1 oz)] 77.2 kg (170 lb 3.1 oz) (03/08 0500)   INTAKE / OUTPUT: Intake/Output     03/07 0701 - 03/08 0700 03/08 0701 - 03/09 0700   I.V. (mL/kg) 1200 (15.5)    NG/GT 390    IV Piggyback     Total Intake(mL/kg) 1590 (20.6)    Urine (mL/kg/hr) 1425 (0.8)    Drains 975 (0.5)    Total Output 2400     Net -810            PHYSICAL EXAMINATION: General Icteric unchanged Neuro: follows commands, smiles HEENT: Sclericterus, jvd wnl Cardiovascular: RRR  s1 s2 Lungs: CTA, no distress, LEft base better aeration Abdomen: soft, NT, NO BS, no r Musculoskeletal: no mass, no pain, no erthema, less edema , muscles weak  Skin: rash improved / resolved  LABS: BMET    Component Value Date/Time   NA 143 03/31/2012 0500   K 4.2 03/31/2012 0500   CL 114* 03/31/2012 0500   CO2 20 03/31/2012 0500   GLUCOSE 102* 03/31/2012 0500   BUN 70* 03/31/2012 0500   CREATININE 1.55* 03/31/2012 0500   CALCIUM 8.1* 03/31/2012 0500   GFRNONAA 49* 03/31/2012 0500   GFRAA 57* 03/31/2012 0500    Hepatic Function Panel     Component  Value Date/Time   PROT 3.9* 03/28/2012 0500   ALBUMIN 2.7* 03/28/2012 0500   AST 82* 03/28/2012 0500   ALT 111* 03/28/2012 0500   ALKPHOS 512* 03/28/2012 0500   BILITOT 13.8* 03/28/2012 0930   BILIDIR 10.8* 03/28/2012 0930   IBILI 3.0* 03/28/2012 0930    CBC    Component Value Date/Time   WBC 6.5 03/30/2012 0428   RBC 3.35* 03/30/2012 0428   HGB 9.9* 03/30/2012 0428   HCT 30.9* 03/30/2012 0428   PLT 58* 03/30/2012 0428   MCV 92.2 03/30/2012 0428   MCH 29.6 03/30/2012 0428   MCHC 32.0 03/30/2012 0428   RDW 26.2* 03/30/2012 0428   LYMPHSABS 0.3* 03/30/2012 0428   MONOABS 0.3 03/30/2012 0428   EOSABS 0.1 03/30/2012 0428   BASOSABS 0.0 03/30/2012 0428      Recent Labs Lab 03/30/12 1150 03/30/12 1551 03/30/12 1935 03/31/12 0006 03/31/12 0450  GLUCAP 98 105* 96 87 102*    No results found.   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 Sedation for vent 3/3 Pain control an issue as abo closure  -PT, spoke to them they will see him today  -fent to q12h  -continue non narcotic maintenance options - tramadol (low dose with liver dz), very hesitant to increase -LF tin am     Severe muscle deconditioning   Shoulder subluxation, right - Cont PT/OT Leg pain -OT re involvement -PT today planned -doppler neg, likely pain left from muscle atrophy etc -will xray leg   GI Wound evisceration    Hyperbilirubinemia, due to cholestasis - Improved mild, AT risk   Dysphagia due to oropharyngeal weakness Liver bx noted, repeat direct, if remains conjugates, will d/w surgery to place drain vs cheolecystectomy as HIDA likely not helpful  -vac change done,  partial closure done in OR, repeat plan 3/9 -T fincreased -repeat SLP-rec wait further -bili in am     Thrombocytopenia, unspecified, HIT panel negative    Coagulopathy - likely due to hepatic dysfunction  Anemia, hemoconcentration well No DIC -scd -no role TX  Pulm    Pleural effusion, L > R Post op resp failure -IS when able -pcxr now pre op for Left base effusion  Renal   ARF (acute renal failure), Cr leveling off -?now CKD   Hypernatremia, improving slowly hypoK -Continue d5w, follow cl Chem in am Allow neg balance daily Muscle mass has reduced, likely crt will continue to follow  Endocrine    Relative adrenal insufficy, to daily steroids  ID Resolved cdiff with MODS Bowel evisceration NO new source IDentified, likely was sirs with evisc -follow fever curve, OR findings  To  2600 awaited  Resolved Problems:    history of UC (ulcerative colitis)   Atrial fibrillation, resolved   Acute respiratory failure, resolved   Septic shock, resolved   RUE DVT related to PICC, resolved   Hypokalemia, resolved   Hyperglycemia, resolved   Acute encephalopathy, resolved  Will update wife and daughter  Mcarthur Rossetti. Tyson Alias, MD, FACP Pgr: [phone] [facility]

## 2012-03-31 NOTE — Progress Notes (Signed)
Physical Therapy Treatment Patient Details Name: Connor Small MRN: [id number] DOB: 1960-10-14 Today's Date: 03/31/2012 Time: 1720-1750 PT Time Calculation (min): 30 min  PT Assessment / Plan / Recommendation Comments on Treatment Session  Pt with limited participation due to fatigue and pain.    Follow Up Recommendations  CIR     Does the patient have the potential to tolerate intense rehabilitation     Barriers to Discharge        Equipment Recommendations  Other (comment)    Recommendations for Other Services Rehab consult  Frequency Min 3X/week   Plan Discharge plan remains appropriate;Frequency needs to be updated    Precautions / Restrictions     Pertinent Vitals/Pain     Mobility  Transfers Transfers: Not assessed    Exercises General Exercises - Lower Extremity Heel Slides: AROM;AAROM;Strengthening;15 reps;Supine Hip ABduction/ADduction: AROM;AAROM;Both;Supine;5 reps Straight Leg Raises: PROM;5 reps;Supine;Left Other Exercises Other Exercises: AAROM at hips and knees 5 reps Other Exercises: Biceps and triceps x 10 reps bil, AAROM at bil wrists,  Shd flexion to 90* scapular protraction bil   PT Diagnosis:    PT Problem List:   PT Treatment Interventions:     PT Goals Acute Rehab PT Goals Time For Goal Achievement: 04/09/12 Potential to Achieve Goals: Good Pt will Perform Home Exercise Program: with min assist PT Goal: Perform Home Exercise Program - Progress: Progressing toward goal  Visit Information  Last PT Received On: 03/31/12 Assistance Needed: +1 (ROM only)    Subjective Data  Subjective: Please stop.Marland KitchenMarland KitchenMarland KitchenI need a breaK Patient Stated Goal: Unable to state   Cognition       Balance  Balance Balance Assessed: No  End of Session PT - End of Session Activity Tolerance: Patient limited by fatigue;Patient limited by pain Patient left: in bed;with bed alarm set;with call bell/phone within reach;with family/visitor present Nurse Communication:  Mobility status   GP     Mottinger, Tessie Fass 03/31/2012, 7:12 PM  03/31/2012  Donnella Sham, [place] [phone] (pager)

## 2012-04-01 ENCOUNTER — Encounter (HOSPITAL_COMMUNITY)
Admission: EM | Disposition: A | Discharge status: Rehabilitation Facility | Payer: Self-pay | Source: Home / Self Care | Attending: Pulmonary Disease

## 2012-04-01 ENCOUNTER — Encounter (HOSPITAL_COMMUNITY): Payer: Self-pay | Admitting: Anesthesiology

## 2012-04-01 ENCOUNTER — Inpatient Hospital Stay (HOSPITAL_COMMUNITY): Payer: BC Managed Care – PPO | Admitting: Anesthesiology

## 2012-04-01 HISTORY — PX: VACUUM ASSISTED CLOSURE CHANGE: SHX5227

## 2012-04-01 LAB — COMPREHENSIVE METABOLIC PANEL
ALT: 162 U/L — ABNORMAL HIGH (ref 0–53)
Alkaline Phosphatase: 883 U/L — ABNORMAL HIGH (ref 39–117)
CO2: 20 mEq/L (ref 19–32)
GFR calc Af Amer: 61 mL/min — ABNORMAL LOW (ref >90)
GFR calc non Af Amer: 52 mL/min — ABNORMAL LOW (ref >90)
Glucose, Bld: 95 mg/dL (ref 70–99)
Potassium: 4 mEq/L (ref 3.5–5.1)
Sodium: 143 mEq/L (ref 135–145)
Total Protein: 3.8 g/dL — ABNORMAL LOW (ref 6.0–8.3)

## 2012-04-01 LAB — GLUCOSE, CAPILLARY
Glucose-Capillary: 97 mg/dL (ref 70–99)
Glucose-Capillary: 87 mg/dL (ref 70–99)
Glucose-Capillary: 108 mg/dL — ABNORMAL HIGH (ref 70–99)

## 2012-04-01 SURGERY — REPLACEMENT, WOUND VAC DRESSING, ABDOMEN
Anesthesia: General | Wound class: Clean Contaminated

## 2012-04-01 MED ORDER — PHENYLEPHRINE HCL 10 MG/ML IJ SOLN
20.0000 mg | INTRAVENOUS | Status: DC | PRN
  Administered 2012-04-01: 60 ug/min via INTRAVENOUS

## 2012-04-01 MED ORDER — LIDOCAINE HCL (CARDIAC) 20 MG/ML IV SOLN
INTRAVENOUS | Status: DC | PRN
  Administered 2012-04-01: 80 mg via INTRAVENOUS

## 2012-04-01 MED ORDER — HYDROMORPHONE HCL PF 1 MG/ML IJ SOLN
0.2500 mg | INTRAMUSCULAR | Status: DC | PRN
  Administered 2012-04-01 (×3): 0.5 mg via INTRAVENOUS
  Filled 2012-04-01: qty 1

## 2012-04-01 MED ORDER — SODIUM CHLORIDE 0.9 % IV BOLUS (SEPSIS)
1000.0000 mL | Freq: Once | INTRAVENOUS | Status: AC
  Administered 2012-04-01: 1000 mL via INTRAVENOUS

## 2012-04-01 MED ORDER — DEXTROSE 50 % IV SOLN
INTRAVENOUS | Status: AC
  Administered 2012-04-01: 25 mL
  Filled 2012-04-01: qty 50

## 2012-04-01 MED ORDER — FENTANYL CITRATE 0.05 MG/ML IJ SOLN
25.0000 ug | INTRAMUSCULAR | Status: DC | PRN
  Administered 2012-04-01 (×2): 25 ug via INTRAVENOUS
  Administered 2012-04-01 – 2012-04-04 (×25): 50 ug via INTRAVENOUS
  Filled 2012-04-01 (×23): qty 2

## 2012-04-01 MED ORDER — ALBUMIN HUMAN 5 % IV SOLN
INTRAVENOUS | Status: DC | PRN
  Administered 2012-04-01: 11:00:00 via INTRAVENOUS

## 2012-04-01 MED ORDER — CIPROFLOXACIN IN D5W 400 MG/200ML IV SOLN
400.0000 mg | INTRAVENOUS | Status: AC
  Administered 2012-04-01: 400 mg via INTRAVENOUS
  Filled 2012-04-01: qty 200

## 2012-04-01 MED ORDER — PROMETHAZINE HCL 25 MG/ML IJ SOLN: 6.2500 mg | INTRAMUSCULAR | Status: DC | PRN

## 2012-04-01 MED ORDER — OXYCODONE HCL 5 MG/5ML PO SOLN: 5.0000 mg | Freq: Once | ORAL | Status: AC | PRN

## 2012-04-01 MED ORDER — OXYCODONE HCL 5 MG PO TABS: 5.0000 mg | ORAL_TABLET | Freq: Once | ORAL | Status: AC | PRN

## 2012-04-01 MED ORDER — SODIUM CHLORIDE 0.9 % IV SOLN
INTRAVENOUS | Status: DC | PRN
  Administered 2012-04-01: 10:00:00 via INTRAVENOUS

## 2012-04-01 MED ORDER — SUCCINYLCHOLINE CHLORIDE 20 MG/ML IJ SOLN
INTRAMUSCULAR | Status: DC | PRN
  Administered 2012-04-01: 60 mg via INTRAVENOUS

## 2012-04-01 MED ORDER — PROPOFOL 10 MG/ML IV BOLUS
INTRAVENOUS | Status: DC | PRN
  Administered 2012-04-01: 60 mg via INTRAVENOUS

## 2012-04-01 MED ORDER — ALBUMIN HUMAN 5 % IV SOLN
INTRAVENOUS | Status: AC
  Administered 2012-04-01: 12.5 g
  Filled 2012-04-01: qty 250

## 2012-04-01 MED ORDER — 0.9 % SODIUM CHLORIDE (POUR BTL) OPTIME
TOPICAL | Status: DC | PRN
  Administered 2012-04-01: 2000 mL

## 2012-04-01 SURGICAL SUPPLY — 31 items
CANISTER SUCT LVC 12 LTR MEDI- (MISCELLANEOUS) ×2 IMPLANT
CANISTER SUCTION 2500CC (MISCELLANEOUS) ×2 IMPLANT
CANISTER WOUND CARE 500ML ATS (WOUND CARE) IMPLANT
CLOTH BEACON ORANGE TIMEOUT ST (SAFETY) ×2 IMPLANT
COVER SURGICAL LIGHT HANDLE (MISCELLANEOUS) ×2 IMPLANT
DRAPE LAPAROSCOPIC ABDOMINAL (DRAPES) ×2 IMPLANT
DRAPE UTILITY 15X26 W/TAPE STR (DRAPE) IMPLANT
DRSG PAD ABDOMINAL 8X10 ST (GAUZE/BANDAGES/DRESSINGS) ×4 IMPLANT
ELECT REM PT RETURN 9FT ADLT (ELECTROSURGICAL) ×2
ELECTRODE REM PT RTRN 9FT ADLT (ELECTROSURGICAL) ×1 IMPLANT
GLOVE BIOGEL PI IND STRL 8 (GLOVE) ×1 IMPLANT
GLOVE BIOGEL PI INDICATOR 8 (GLOVE) ×1
GLOVE SS BIOGEL STRL SZ 7.5 (GLOVE) ×1 IMPLANT
GLOVE SUPERSENSE BIOGEL SZ 7.5 (GLOVE) ×1
GOWN PREVENTION PLUS XLARGE (GOWN DISPOSABLE) ×2 IMPLANT
GOWN STRL NON-REIN LRG LVL3 (GOWN DISPOSABLE) ×2 IMPLANT
KIT BASIN OR (CUSTOM PROCEDURE TRAY) ×2 IMPLANT
KIT ROOM TURNOVER OR (KITS) ×2 IMPLANT
NS IRRIG 1000ML POUR BTL (IV SOLUTION) ×4 IMPLANT
PACK GENERAL/GYN (CUSTOM PROCEDURE TRAY) ×2 IMPLANT
PAD ARMBOARD 7.5X6 YLW CONV (MISCELLANEOUS) ×4 IMPLANT
SPONGE ABDOMINAL VAC ABTHERA (MISCELLANEOUS) IMPLANT
SPONGE GAUZE 4X4 12PLY (GAUZE/BANDAGES/DRESSINGS) ×2 IMPLANT
SUCTION POOLE TIP (SUCTIONS) ×2 IMPLANT
SUT ETHIBOND 5 LR DA (SUTURE) ×6 IMPLANT
SUT NOVA 1 T20/GS 25DT (SUTURE) ×6 IMPLANT
SUT RET BRIDGE (SUTURE) ×2 IMPLANT
TAPE CLOTH SURG 4X10 WHT LF (GAUZE/BANDAGES/DRESSINGS) ×2 IMPLANT
TOWEL OR 17X24 6PK STRL BLUE (TOWEL DISPOSABLE) ×2 IMPLANT
TOWEL OR 17X26 10 PK STRL BLUE (TOWEL DISPOSABLE) ×2 IMPLANT
WATER STERILE IRR 1000ML POUR (IV SOLUTION) IMPLANT

## 2012-04-01 NOTE — Progress Notes (Signed)
PULMONARY  / CRITICAL CARE MEDICINE  Name: DAVARIAN LOHSE MRN: [id number] DOB: Jan 03, 1961    ADMISSION DATE:  02/26/2012 CONSULTATION DATE:  2/5  REFERRING MD :  Karilyn Cota  CHIEF COMPLAINT:  Acute resp failure   BRIEF PATIENT DESCRIPTION:  81 YOM admitted to [facility] for Cdiff on 2/2. Underwent decompressive cecostomy 2/3, initially was better, then worsened on 2/4 with increased abdominal distension and tenderness, decreased renal function.  He was taken back to the OR on 2/4 for total abdominal colectomy and end ileostomy.  Post op he developed ARDS and septic shock and was being treated for this.  The morning of 2/6 he had new onset of A-fib and transferred to [facility].   Course complicated by severe hypoalbuminemia, elevated LFTs  -cholestatic without biliary obstruction & pre-renal failure. Hypotensive 3/2 am requiring albumin  SIGNIFICANT EVENTS / STUDIES:  2/2 Admit to [facility] with C diff, toxic megacolon 2/3 Decompressive Cecostomy 2/4 Total abdominal colectomy and end ileostomy 2/6 New A-fib and transfer to [facility] health. 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 2/10 CT abd/pelvis: small hematoma likely subcapsular spleen, JPs wnl 2/11 trial TFs  2/11 UE venous dopplers: Findings consistent with superficial vein thrombosis involving the right basilic vein 2/14 LE venous dopplers: No evidence of deep vein thrombosis 2/14 LUE venous doppler: No evidence of deep vein or superficial thrombosis involving the left upper extremity  2/15 neg 4 l bal on lasix drip, lasix d/c  2/16 poor neuro status, CT head: [facility] 2/17 CRRT initiated, high pressor needs 2/17 RUQ [place]: Gallbladder wall thickening in the absence of sludge or stones but no biliary ductal dilatation 2/18 reduced vasopressor reqts 2/19 New onset thrombocytopenia. Heparin D/C'd. Argatroban  initiated 2/19 [place] abd (repeat): No explanation for elevated liver function tests 2/20 reduction pressors, improved alertness, extubated 2/20 LE venous dopplers: No evidence of deep vein or superficial thrombosis 2/21 bleeding overnight from JP's, argatroban turned off, lower pressor needs 2/22 off pressors, on RA, converted to NSR 2/22 Carotid dopplers: No significant extracranial carotid artery stenosis demonstrated 2/25 Cystoscopy with bilateral retrograde pyelograms. No extravasation of contrast from either ureteral orifice. No evidence of hydroureteronephrosis. Unremarkable urinary bladder. B ureteral stents placed 2/27 MRI brain: Scattered small acute to subacute infarcts in both cerebral hemispheres, brainstem, and right cerebellum. Favor sequelae of emboli from a cardiac or proximal aortic source. Main alternate consideration is hypotensive episode 2/27 MRCP: No intrahepatic bile duct dilatation or common bile duct dilatation. No gallstones identified. Large subcapsular fluid collection encases the spleen. This is of indeterminate etiology. This may represent a subacute to chronic subcapsular hematoma or abscess. 2/28 SLP eval: FEES performed. Oropharyngeal weakness noted. Recommend replacement of NGT to supplement PO intake 3/1 Renal signed off 3/3- bowel evisceration, to OR 3/3- no perf / abscess, wound vac open could not close, Liver BX, some oozing, post op resp failure 3/3 echo repeat - improved ef55%, poor windows 3/4- low dose pressor needs 3/4- extubated, high output again from JP's 3/6- OR trip, partial closure wound 3/7- renal fxn continues to improve 3/8- doppler legs >>>Prelim NEG  LINES / TUBES: ETT ([facility]) 2/4>>> 2/20 PICC ([facility]) 2/4>>>2/11 Art Line ([facility]) 2/5>>>out L IJ CVL 2/06 >> out A line R fem 2/18>>>2/24 Rt ij HD 2/17 >> out Left IJ 2/18 >>  ETT 3/3>>>out post op x 2  CULTURES: BCx2 2/4>>>negative  BCx2 2/3>>>negative UC 2/3- Negative MRSA PCR 2/3-  Negative C diff 2/6>>>Positive Body fluid 2/9>>>WBCs, no organisms 2/15 BC x 2 >> NEG 2/15 Sputum >>>NF 3/2 bc>>>NGTD 3/2 urine>>>neg 3/2 sputum>>>neg 3/5 cdiff>>>neg  PATH: 3/3 liver bx>>>BENIGN LIVER WITH CHOLESTASIS, The biopsy findings are that of extrahepatic biliary obstruction. No noted primary sclerosing cholangitis.  ANTIBIOTICS: Flagyl 2/3>> d/c'd Vanc 2/6 (IV) >>>2/7 azactam 2/6>>>2/7 Oral vanc 2/6>>>off IV vanc 2/16 >2/24 Aztreonam 2/16 >2/17 Imipenem 2/17>>>2/24 mycofungin 2/18>>>2/24  cipro 3/3>>>3/6 azactam 3/2>>>3/3 vanc 3/2>>>3/5 Flagyl 3/3>>>3/5  SUBJECTIVE:  Mild tachy, nausea  VITAL SIGNS: Temp:  [97.3 F (36.3 C)-97.4 F (36.3 C)] 97.4 F (36.3 C) (03/09 0437) Pulse Rate:  [91-113] 105 (03/09 0800) Resp:  [8-16] 10 (03/09 0800) BP: (87-108)/(61-71) 95/65 mmHg (03/09 0800) SpO2:  [96 %-98 %] 96 % (03/09 0800) Weight:  [77 kg (169 lb 12.1 oz)] 77 kg (169 lb 12.1 oz) (03/09 0400)   INTAKE / OUTPUT: Intake/Output     03/08 0701 - 03/09 0700 03/09 0701 - 03/10 0700   I.V. (mL/kg) 1200 (15.6) 50 (0.6)   NG/GT 400    Total Intake(mL/kg) 1600 (20.8) 50 (0.6)   Urine (mL/kg/hr) 1250 (0.7) 45 (0.3)   Drains 500 (0.3) 450 (3.3)   Stool 100 (0.1)    Total Output 1850 495   Net -250 -445          PHYSICAL EXAMINATION: General Icteric unchanged Neuro: follows commands well, speaking increased HEENT: Sclericterus, jvd wnl Cardiovascular: RRR  s1 s2 Lungs: CTA, no distress, LEft base distant Abdomen: soft, NT, NO BS, no r Musculoskeletal: no mass, no pain, no erthema, less edema daily , muscles weak  Skin: rash improved / resolved  LABS: BMET    Component Value Date/Time   NA 143 04/01/2012 0436   K 4.0 04/01/2012 0436   CL 113* 04/01/2012 0436   CO2 20 04/01/2012 0436   GLUCOSE 95 04/01/2012 0436   BUN 70* 04/01/2012 0436   CREATININE 1.47* 04/01/2012 0436   CALCIUM 7.7* 04/01/2012 0436   GFRNONAA 52* 04/01/2012 0436   GFRAA 61* 04/01/2012 0436     Hepatic Function Panel     Component Value Date/Time   PROT 3.8* 04/01/2012 0436   ALBUMIN 1.9* 04/01/2012 0436   AST 149* 04/01/2012 0436   ALT 162* 04/01/2012 0436   ALKPHOS 883* 04/01/2012 0436   BILITOT 14.3* 04/01/2012 0436   BILIDIR 14.0* 03/31/2012 0500   IBILI 1.3* 03/31/2012 0500    CBC    Component Value Date/Time   WBC 6.5 03/30/2012 0428   RBC 3.35* 03/30/2012 0428   HGB 9.9* 03/30/2012 0428   HCT 30.9* 03/30/2012 0428   PLT 58* 03/30/2012 0428   MCV 92.2 03/30/2012 0428   MCH 29.6 03/30/2012 0428   MCHC 32.0 03/30/2012 0428   RDW 26.2* 03/30/2012 0428   LYMPHSABS 0.3* 03/30/2012 0428   MONOABS 0.3 03/30/2012 0428   EOSABS 0.1 03/30/2012 0428   BASOSABS 0.0 03/30/2012 0428      Recent Labs Lab 03/31/12 1723 03/31/12 1952 03/31/12 2359 04/01/12 0318 04/01/12 0720  GLUCAP 111* 110* 116* 94 101*    Dg Tibia/fibula Left  03/31/2012  *RADIOLOGY REPORT*  Clinical Data: Left tibia and fibula pain.  Evaluate for fracture.  LEFT TIBIA AND FIBULA - 2 VIEW  Comparison: None.  Findings:  No fracture or dislocation.  Regional soft tissues are normal.  Limited visualization of the adjacent knee suggest minimal enthesopathic change of the superior pole  of the patella.  Right knee joint spaces appear grossly preserved.  No definite suprapatellar joint effusion.  Limited visualization of the adjacent ankle is normal.  IMPRESSION: No fracture.   Original Report Authenticated By: Tacey Ruiz, MD    Dg Chest Port 1 View  03/31/2012  *RADIOLOGY REPORT*  Clinical Data: Evaluate left-sided pleural effusion  PORTABLE CHEST - 1 VIEW  Comparison: 03/27/2012; 03/26/2012; 03/24/2012  Findings: Grossly unchanged cardiac silhouette and mediastinal contours.  Interval extubation.  Stable position of support apparatus.  The suspected minimal increase in now likely moderate- sized left-sided pleural effusion.  Interval decrease with persistent possible trace right-sided pleural effusion.  Interval worsening of left mid and  lower lung heterogeneous / consolidative opacity.  Improved aeration of the right lung base.  No definite evidence of pulmonary edema.  No pneumothorax.  Unchanged bones.  IMPRESSION: 1.  Interval extubation.  Otherwise, stable positioning of support apparatus.  No pneumothorax. 2.  Interval increase in now likely moderate-sized left-sided pleural effusion with corresponding increase in left mid and lower lung opacities, atelectasis versus infiltrate. 3.  Interval decrease in persistent likely trace right-sided pleural effusion with improved aeration of right lung base.   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 Sedation for vent 3/3 Pain control an issue as abo closure  -PT -fent to q1h prn -Nausea, dc tramdaol -LFt    Severe muscle deconditioning   Shoulder subluxation, right - Cont PT/OT Leg pain -OT re involvement awiated -PT when able -doppler neg, likely pain left from muscle atrophy etc -will xray leg - neg  GI Wound evisceration    Hyperbilirubinemia, due to cholestasis   Dysphagia due to oropharyngeal weakness Liver bx noted, cholestasis unclear etiology  -vac change today -TF, may need to re reduce with nausea, will d/w surgery -repeat SLP this week -bili noted, gallbladder surgically appeared well, liver bx noted -dc tramadol, address amy, lip -zofran    Thrombocytopenia, unspecified, HIT panel negative    Coagulopathy - likely due to hepatic dysfunction  Anemia, hemoconcentration well No DIC -scd -no role TX -trying to limit phlebotomy when  able  Pulm    Pleural effusion, L > R Remaining effusion but no distress -IS when able -may need repeat thora in future, continue neg balance on own  Renal   ARF (acute renal failure), Cr leveling off -?now CKD   Hypernatremia, improving slowly hypoK -Continue d5w, follow cl Chem in am Allow neg balance daily  Endocrine    Relative adrenal insufficy Dc roids post op after today, will also help wound healing  ID Resolved cdiff with MODS Bowel evisceration NO new source IDentified, likely was sirs with evisc -follow OR findings  To  2600 awaited  Resolved Problems:    history of UC (ulcerative colitis)   Atrial fibrillation, resolved   Acute respiratory failure, resolved   Septic shock, resolved   RUE DVT related to PICC, resolved   Hypokalemia, resolved   Hyperglycemia, resolved   Acute encephalopathy, resolved  Will update wife and daughter  Mcarthur Rossetti. Tyson Alias, MD, FACP Pgr: [phone] [facility]

## 2012-04-01 NOTE — Anesthesia Preprocedure Evaluation (Addendum)
Anesthesia Evaluation  Patient identified by MRN, date of birth, ID band Patient awake and Patient confused    Reviewed: Allergy & Precautions, H&P , NPO status , Patient's Chart, lab work & pertinent test results  History of Anesthesia Complications Negative for: history of anesthetic complications  Airway Mallampati: II TM Distance: >3 FB Neck ROM: Full    Dental  (+) Teeth Intact and Dental Advisory Given   Pulmonary neg pulmonary ROS,  breath sounds clear to auscultation        Cardiovascular + dysrhythmias Atrial Fibrillation Rhythm:Regular Rate:Normal     Neuro/Psych Sedated negative psych ROS   GI/Hepatic Neg liver ROS, PUD, c-diff   Endo/Other  Adrenal isufficiency  Renal/GU CRFRenal disease  negative genitourinary   Musculoskeletal   Abdominal   Peds  Hematology   Anesthesia Other Findings   Reproductive/Obstetrics negative OB ROS                          Anesthesia Physical Anesthesia Plan  ASA: III  Anesthesia Plan: General   Post-op Pain Management:    Induction: Intravenous  Airway Management Planned: Oral ETT  Additional Equipment:   Intra-op Plan:   Post-operative Plan: Possible Post-op intubation/ventilation  Informed Consent: I have reviewed the patients History and Physical, chart, labs and discussed the procedure including the risks, benefits and alternatives for the proposed anesthesia with the patient or authorized representative who has indicated his/her understanding and acceptance.   Dental advisory given  Plan Discussed with: CRNA, Anesthesiologist and Surgeon  Anesthesia Plan Comments:        Anesthesia Quick Evaluation                                   Anesthesia Evaluation  Patient identified by MRN, date of birth, ID band Patient awake    Reviewed: Allergy & Precautions, H&P , NPO status , Patient's Chart, lab work & pertinent test  results  Airway Mallampati: II TM Distance: >3 FB Neck ROM: Full    Dental  (+) Teeth Intact and Dental Advisory Given   Pulmonary          Cardiovascular Rhythm:Regular Rate:Normal     Neuro/Psych    GI/Hepatic PUD,   Endo/Other    Renal/GU CRFRenal disease     Musculoskeletal   Abdominal   Peds  Hematology   Anesthesia Other Findings   Reproductive/Obstetrics                          Anesthesia Physical Anesthesia Plan  ASA: III  Anesthesia Plan: General   Post-op Pain Management:    Induction: Intravenous, Rapid sequence and Cricoid pressure planned  Airway Management Planned: Oral ETT  Additional Equipment:   Intra-op Plan:   Post-operative Plan: Possible Post-op intubation/ventilation  Informed Consent: I have reviewed the patients History and Physical, chart, labs and discussed the procedure including the risks, benefits and alternatives for the proposed anesthesia with the patient or authorized representative who has indicated his/her understanding and acceptance.     Plan Discussed with: CRNA and Surgeon  Anesthesia Plan Comments: (Pt very sedated. Has NGtube in place. 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 Will need ETTube to protect his airway perioperatively. Would he no behnefit from remaining intubated till his belly is finally closed? Plan: ETT for airway management. Lillia Abed MD)  Anesthesia Quick Evaluation

## 2012-04-01 NOTE — Anesthesia Postprocedure Evaluation (Signed)
Anesthesia Post Note  Patient: Connor Small  Procedure(s) Performed: Procedure(s) (LRB): removal of abdominal vac dressing and abdominal closure (N/A)  Anesthesia type: MAC  Patient location: PACU  Post pain: Pain level controlled  Post assessment: Patient's Cardiovascular Status Stable  Last Vitals:  Filed Vitals:   04/01/12 1145  BP: 95/61  Pulse: 97  Temp:   Resp: 9    Post vital signs: Reviewed and stable  Level of consciousness: sedated  Complications: No apparent anesthesia complications

## 2012-04-01 NOTE — Op Note (Signed)
Preoperative Diagnosis: abdominal wound dehiscence  Postoprative Diagnosis: same  Procedure: Procedure(s): removal of abdominal vac dressing and abdominal closure   Surgeon: Excell Seltzer T   Assistants: none  Anesthesia:  General endotracheal anesthesia   Indications:   Patient is a 52 year old male several weeks following emergency total abdominal colectomy and ileostomy. He suffered a wound dehiscence and evisceration approximately one week ago. He has undergone placement of an abdominal vac dressing with an open abdominal cavity. At his last vac change the fascial dehiscence was partially closed. He is now brought back to the operating room for removal of his VAC dressing and possible wound closure or placement of a new VAC dressing.  Procedure Detail:  Patient is brought to the operating room, placed in supine position on the operating table, and general endotracheal anesthesia induced. He was given a single dose of perioperative antibiotics preoperatively. Patient time out was performed and correct procedure verified. The outer VAC dressing and sponge ileostomy device were removed. The abdomen was sterilely prepped and draped. I then removed the inner abdominal VAC sponge. The anterior loops of small intestine appeared healthy. These were carefully urinated and I did not disturb the viscera otherwise. The abdominal wall was more compliant and the fascial edges appeared reasonably healthy at this point I felt that his abdomen could be closed without undue tension. Initially I placed 3 retention sutures of #5 Ethibond which were placed preperitoneum relieved the deep fascia widely out from the abdominal edge placing tube in the operating midabdomen above the ileostomy and one in the lower abdomen. The center portion of the wound which had not yet been closed was then closed with interrupted #1 Novafil taking broad bites of the fascia and it came together with fortunately just minimal  tension. Following this the retention sutures were secured and the wound was packed with moist saline gauze and dry sterile dressing was applied. Sponge needle and instrument counts were correct.   Estimated Blood Loss:  Minimal         Drains: None  Blood Given: none          Specimens: None        Complications:  * No complications entered in OR log *         Disposition: PACU - hemodynamically stable.         Condition: stable

## 2012-04-01 NOTE — Transfer of Care (Signed)
Immediate Anesthesia Transfer of Care Note  Patient: Connor Small  Procedure(s) Performed: Procedure(s): removal of abdominal vac dressing and abdominal closure (N/A)  Patient Location: PACU  Anesthesia Type:General  Level of Consciousness: sedated  Airway & Oxygen Therapy: Patient Spontanous Breathing and Patient connected to nasal cannula oxygen  Post-op Assessment: Report given to PACU RN and Post -op Vital signs reviewed and stable  Post vital signs: Reviewed and stable  Complications: No apparent anesthesia complications

## 2012-04-02 ENCOUNTER — Encounter (HOSPITAL_COMMUNITY): Payer: Self-pay | Admitting: General Surgery

## 2012-04-02 LAB — BASIC METABOLIC PANEL
CO2: 18 mEq/L — ABNORMAL LOW (ref 19–32)
Calcium: 7.8 mg/dL — ABNORMAL LOW (ref 8.4–10.5)
Creatinine, Ser: 1.37 mg/dL — ABNORMAL HIGH (ref 0.50–1.35)
GFR calc non Af Amer: 57 mL/min — ABNORMAL LOW (ref >90)
Glucose, Bld: 103 mg/dL — ABNORMAL HIGH (ref 70–99)

## 2012-04-02 LAB — PREPARE RBC (CROSSMATCH)

## 2012-04-02 LAB — CBC
HCT: 22.6 % — ABNORMAL LOW (ref 39.0–52.0)
Hemoglobin: 7.2 g/dL — ABNORMAL LOW (ref 13.0–17.0)
MCH: 30.6 pg (ref 26.0–34.0)
MCV: 96.2 fL (ref 78.0–100.0)
RBC: 2.35 MIL/uL — ABNORMAL LOW (ref 4.22–5.81)
WBC: 5.3 10*3/uL (ref 4.0–10.5)

## 2012-04-02 LAB — GLUCOSE, CAPILLARY
Glucose-Capillary: 104 mg/dL — ABNORMAL HIGH (ref 70–99)
Glucose-Capillary: 90 mg/dL (ref 70–99)

## 2012-04-02 LAB — CBC WITH DIFFERENTIAL/PLATELET
Eosinophils Relative: 1 % (ref 0–5)
HCT: 26.3 % — ABNORMAL LOW (ref 39.0–52.0)
Lymphs Abs: 0.3 10*3/uL — ABNORMAL LOW (ref 0.7–4.0)
MCH: 29.7 pg (ref 26.0–34.0)
MCV: 94.3 fL (ref 78.0–100.0)
Monocytes Absolute: 0.3 10*3/uL (ref 0.1–1.0)
Monocytes Relative: 5 % (ref 3–12)
Neutro Abs: 5.6 10*3/uL (ref 1.7–7.7)
Platelets: 59 10*3/uL — ABNORMAL LOW (ref 150–400)
RDW: 24.7 % — ABNORMAL HIGH (ref 11.5–15.5)
WBC: 6.3 10*3/uL (ref 4.0–10.5)

## 2012-04-02 LAB — PROTIME-INR: Prothrombin Time: 15.9 seconds — ABNORMAL HIGH (ref 11.6–15.2)

## 2012-04-02 LAB — AMYLASE: Amylase: 91 U/L (ref 0–105)

## 2012-04-02 MED ORDER — SODIUM CHLORIDE 0.9 % IV SOLN
8.0000 mg/h | INTRAVENOUS | Status: DC
  Administered 2012-04-02 – 2012-04-04 (×5): 8 mg/h via INTRAVENOUS
  Filled 2012-04-02 (×12): qty 80

## 2012-04-02 MED ORDER — POTASSIUM CHLORIDE 10 MEQ/50ML IV SOLN
10.0000 meq | INTRAVENOUS | Status: AC
  Administered 2012-04-02 (×2): 10 meq via INTRAVENOUS
  Filled 2012-04-02: qty 100

## 2012-04-02 MED ORDER — VITAL AF 1.2 CAL PO LIQD
1000.0000 mL | ORAL | Status: DC
  Administered 2012-04-02: 1000 mL
  Filled 2012-04-02 (×4): qty 1000

## 2012-04-02 MED ORDER — HYDROCORTISONE SOD SUCCINATE 100 MG IJ SOLR
50.0000 mg | Freq: Four times a day (QID) | INTRAMUSCULAR | Status: DC
  Administered 2012-04-02 – 2012-04-03 (×4): 50 mg via INTRAVENOUS
  Filled 2012-04-02 (×8): qty 1

## 2012-04-02 MED ORDER — ONDANSETRON HCL 4 MG/2ML IJ SOLN
4.0000 mg | INTRAMUSCULAR | Status: DC | PRN
  Administered 2012-04-02 – 2012-04-16 (×23): 4 mg via INTRAVENOUS
  Filled 2012-04-02 (×24): qty 2

## 2012-04-02 MED ORDER — LACTULOSE 10 GM/15ML PO SOLN
30.0000 g | Freq: Every day | ORAL | Status: DC
  Administered 2012-04-02: 30 g via ORAL
  Filled 2012-04-02 (×2): qty 45

## 2012-04-02 NOTE — Progress Notes (Signed)
Spoke with Dr. Titus Mould regarding restarting SSI d/t  Pt being restarted on steroids and increasing TF rate. At this time will not restart SSI. Call MD if CBGs greater than 200.

## 2012-04-02 NOTE — Progress Notes (Signed)
Orthopedic Tech Progress Note Patient Details:  Connor Small [birth year] [id number]  Ortho Devices Type of Ortho Device: Abdominal binder Ortho Device/Splint Location: abdomen Ortho Device/Splint Interventions: Ordered Abdominal binder given to rn to apply; viewed order from rn order list  Hildred Priest 04/02/2012, 2:19 PM

## 2012-04-02 NOTE — Progress Notes (Signed)
PULMONARY  / CRITICAL CARE MEDICINE  Name: Connor Small MRN: [id number] DOB: Nov 27, 1960    ADMISSION DATE:  02/26/2012 CONSULTATION DATE:  2/5  REFERRING MD :  Karilyn Cota  CHIEF COMPLAINT:  Acute resp failure   BRIEF PATIENT DESCRIPTION:  74 YOM admitted to [facility] for Cdiff on 2/2. Underwent decompressive cecostomy 2/3, initially was better, then worsened on 2/4 with increased abdominal distension and tenderness, decreased renal function.  He was taken back to the OR on 2/4 for total abdominal colectomy and end ileostomy.  Post op he developed ARDS and septic shock and was being treated for this.  The morning of 2/6 he had new onset of A-fib and transferred to [facility].   Course complicated by severe hypoalbuminemia, elevated LFTs  -cholestatic without biliary obstruction & pre-renal failure. Hypotensive 3/2 am requiring albumin  SIGNIFICANT EVENTS / STUDIES:  2/2 Admit to [facility] with C diff, toxic megacolon 2/3 Decompressive Cecostomy 2/4 Total abdominal colectomy and end ileostomy 2/6 New A-fib and transfer to [facility] health. 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 2/10 CT abd/pelvis: small hematoma likely subcapsular spleen, JPs wnl 2/11 trial TFs  2/11 UE venous dopplers: Findings consistent with superficial vein thrombosis involving the right basilic vein 2/14 LE venous dopplers: No evidence of deep vein thrombosis 2/14 LUE venous doppler: No evidence of deep vein or superficial thrombosis involving the left upper extremity  2/15 neg 4 l bal on lasix drip, lasix d/c  2/16 poor neuro status, CT head: [facility] 2/17 CRRT initiated, high pressor needs 2/17 RUQ [place]: Gallbladder wall thickening in the absence of sludge or stones but no biliary ductal dilatation 2/18 reduced vasopressor reqts 2/19 New onset thrombocytopenia. Heparin D/C'd. Argatroban  initiated 2/19 [place] abd (repeat): No explanation for elevated liver function tests 2/20 reduction pressors, improved alertness, extubated 2/20 LE venous dopplers: No evidence of deep vein or superficial thrombosis 2/21 bleeding overnight from JP's, argatroban turned off, lower pressor needs 2/22 off pressors, on RA, converted to NSR 2/22 Carotid dopplers: No significant extracranial carotid artery stenosis demonstrated 2/25 Cystoscopy with bilateral retrograde pyelograms. No extravasation of contrast from either ureteral orifice. No evidence of hydroureteronephrosis. Unremarkable urinary bladder. B ureteral stents placed 2/27 MRI brain: Scattered small acute to subacute infarcts in both cerebral hemispheres, brainstem, and right cerebellum. Favor sequelae of emboli from a cardiac or proximal aortic source. Main alternate consideration is hypotensive episode 2/27 MRCP: No intrahepatic bile duct dilatation or common bile duct dilatation. No gallstones identified. Large subcapsular fluid collection encases the spleen. This is of indeterminate etiology. This may represent a subacute to chronic subcapsular hematoma or abscess. 2/28 SLP eval: FEES performed. Oropharyngeal weakness noted. Recommend replacement of NGT to supplement PO intake 3/1 Renal signed off 3/3- bowel evisceration, to OR 3/3- no perf / abscess, wound vac open could not close, Liver BX, some oozing, post op resp failure 3/3 echo repeat - improved ef55%, poor windows 3/4- low dose pressor needs 3/4- extubated, high output again from JP's 3/6- OR trip, partial closure wound 3/7- renal fxn continues to improve 3/8- doppler legs >>>Prelim NEG 3/9- hypotension, volume given 3/9- closed, retention service  LINES / TUBES: ETT ([facility]) 2/4>>> 2/20 PICC ([facility]) 2/4>>>2/11 Art Line ([facility]) 2/5>>>out L IJ CVL 2/06 >> out A line R fem 2/18>>>2/24 Rt ij HD 2/17 >> out Left IJ 2/18 >>  ETT 3/3>>>out  post op x 2  CULTURES: BCx2  2/4>>>negative BCx2 2/3>>>negative UC 2/3- Negative MRSA PCR 2/3- Negative C diff 2/6>>>Positive Body fluid 2/9>>>WBCs, no organisms 2/15 BC x 2 >> NEG 2/15 Sputum >>>NF 3/2 bc>>>NGTD 3/2 urine>>>neg 3/2 sputum>>>neg 3/5 cdiff>>>neg  PATH: 3/3 liver bx>>>BENIGN LIVER WITH CHOLESTASIS, The biopsy findings are that of extrahepatic biliary obstruction. No noted primary sclerosing cholangitis.  ANTIBIOTICS: Flagyl 2/3>> d/c'd Vanc 2/6 (IV) >>>2/7 azactam 2/6>>>2/7 Oral vanc 2/6>>>off IV vanc 2/16 >2/24 Aztreonam 2/16 >2/17 Imipenem 2/17>>>2/24 mycofungin 2/18>>>2/24  cipro 3/3>>>3/6 azactam 3/2>>>3/3 vanc 3/2>>>3/5 Flagyl 3/3>>>3/5  SUBJECTIVE:  Hypotension, got fluids  VITAL SIGNS: Temp:  [96.7 F (35.9 C)-97.8 F (36.6 C)] 97.8 F (36.6 C) (03/10 0805) Pulse Rate:  [94-118] 111 (03/10 0800) Resp:  [8-27] 27 (03/10 0800) BP: (82-111)/(49-64) 91/58 mmHg (03/10 0800) SpO2:  [96 %-100 %] 99 % (03/10 0800) Weight:  [82.3 kg (181 lb 7 oz)] 82.3 kg (181 lb 7 oz) (03/10 0500)   INTAKE / OUTPUT: Intake/Output     03/09 0701 - 03/10 0700 03/10 0701 - 03/11 0700   I.V. (mL/kg) 1525.8 (18.5) 50 (0.6)   Other 150    NG/GT 270 20   IV Piggyback 1250    Total Intake(mL/kg) 3195.8 (38.8) 70 (0.9)   Urine (mL/kg/hr) 1435 (0.7)    Drains 450 (0.2)    Stool     Blood 15 (0)    Total Output 1900     Net +1295.8 +70          PHYSICAL EXAMINATION: General Icteric unchanged Neuro: follows commands well, speaking HEENT: Sclericterus, jvd wnl Cardiovascular: RRR  s1 s2 Lungs: CTA, no distress Abdomen: soft, NT, BS wnl, closed abdo Musculoskeletal: no mass, no pain, no erthema, less edema daily , muscles weak  Skin:  No rash  LABS: BMET    Component Value Date/Time   NA 142 04/02/2012 0424   K 3.8 04/02/2012 0424   CL 115* 04/02/2012 0424   CO2 18* 04/02/2012 0424   GLUCOSE 103* 04/02/2012 0424   BUN 69* 04/02/2012 0424   CREATININE 1.37* 04/02/2012 0424   CALCIUM  7.8* 04/02/2012 0424   GFRNONAA 57* 04/02/2012 0424   GFRAA 66* 04/02/2012 0424    Hepatic Function Panel     Component Value Date/Time   PROT 3.8* 04/01/2012 0436   ALBUMIN 1.9* 04/01/2012 0436   AST 149* 04/01/2012 0436   ALT 162* 04/01/2012 0436   ALKPHOS 883* 04/01/2012 0436   BILITOT 14.3* 04/01/2012 0436   BILIDIR 14.0* 03/31/2012 0500   IBILI 1.3* 03/31/2012 0500    CBC    Component Value Date/Time   WBC 6.5 03/30/2012 0428   RBC 3.35* 03/30/2012 0428   HGB 9.9* 03/30/2012 0428   HCT 30.9* 03/30/2012 0428   PLT 58* 03/30/2012 0428   MCV 92.2 03/30/2012 0428   MCH 29.6 03/30/2012 0428   MCHC 32.0 03/30/2012 0428   RDW 26.2* 03/30/2012 0428   LYMPHSABS 0.3* 03/30/2012 0428   MONOABS 0.3 03/30/2012 0428   EOSABS 0.1 03/30/2012 0428   BASOSABS 0.0 03/30/2012 0428      Recent Labs Lab 04/01/12 1654 04/01/12 1933 04/01/12 2345 04/02/12 0352 04/02/12 0805  GLUCAP 108* 84 90 104* 118*    Dg Tibia/fibula Left  03/31/2012  *RADIOLOGY REPORT*  Clinical Data: Left tibia and fibula pain.  Evaluate for fracture.  LEFT TIBIA AND FIBULA - 2 VIEW  Comparison: None.  Findings:  No fracture or dislocation.  Regional soft  tissues are normal.  Limited visualization of the adjacent knee suggest minimal enthesopathic change of the superior pole of the patella.  Right knee joint spaces appear grossly preserved.  No definite suprapatellar joint effusion.  Limited visualization of the adjacent ankle is normal.  IMPRESSION: No fracture.   Original Report Authenticated By: Tacey Ruiz, MD    Dg Chest Port 1 View  03/31/2012  *RADIOLOGY REPORT*  Clinical Data: Evaluate left-sided pleural effusion  PORTABLE CHEST - 1 VIEW  Comparison: 03/27/2012; 03/26/2012; 03/24/2012  Findings: Grossly unchanged cardiac silhouette and mediastinal contours.  Interval extubation.  Stable position of support apparatus.  The suspected minimal increase in now likely moderate- sized left-sided pleural effusion.  Interval decrease with persistent  possible trace right-sided pleural effusion.  Interval worsening of left mid and lower lung heterogeneous / consolidative opacity.  Improved aeration of the right lung base.  No definite evidence of pulmonary edema.  No pneumothorax.  Unchanged bones.  IMPRESSION: 1.  Interval extubation.  Otherwise, stable positioning of support apparatus.  No pneumothorax. 2.  Interval increase in now likely moderate-sized left-sided pleural effusion with corresponding increase in left mid and lower lung opacities, atelectasis versus infiltrate. 3.  Interval decrease in persistent likely trace right-sided pleural effusion with improved aeration of right lung base.   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 Sedation for vent 3/3 Pain control an issue as abo closure  -PT -fent to q2h prn -Nausea improved, avoid tramadol -Lactulose ensure on daily    Severe muscle deconditioning   Shoulder subluxation, right - Cont PT/OT dopper neg, xray neg Leg pain -OT planned  -PT today  GI Wound evisceration    Hyperbilirubinemia, due to cholestasis   Dysphagia due to oropharyngeal weakness Liver bx noted, cholestasis unclear etiology Wound closed 3/9  -TF to goal -repeat SLP today -bili follow up -wnl-  amy, lip -zofran    Thrombocytopenia, unspecified, HIT panel negative    Coagulopathy - likely due to hepatic dysfunction  Anemia, hemoconcentration well No DIC -scd -trying to limit phlebotomy when able -push PT  Pulm    Pleural effusion, L > R Remaining effusion but no distress  -IS encouraged -may need repeat thora in future but no distress  Renal   ARF (acute renal failure), Cr leveling off -?now CKD   Hypovolemia 3/9 hypoK -Continue d5w, follow cl in 48  hrs  Endocrine    Relative adrenal insufficy Off steroids, re add if shock noted or borderline BP  ID Resolved cdiff with MODS Bowel evisceration NO new source IDentified, likely was sirs with evisc -follow temp curve  To  2600   Resolved Problems:    history of UC (ulcerative colitis)   Atrial fibrillation, resolved   Acute respiratory failure, resolved   Septic shock, resolved   RUE DVT related to PICC, resolved   Hypokalemia, resolved   Hyperglycemia, resolved   Acute encephalopathy, resolved  Will update wife  Mcarthur Rossetti. Tyson Alias, MD, FACP Pgr: [phone] [facility]

## 2012-04-02 NOTE — Progress Notes (Signed)
Anemia   NG on intermittent suction with small blood clots;   Protonix drip ordered, CBC at midnight, PT, INR, PTT stat;   NPO for now;   If continues to have bleeding, we will have a low threshold to transfuse and to consult GI for further management.

## 2012-04-02 NOTE — Progress Notes (Signed)
Pt's SBP 80s-90s. MAP 60s. Dr. Titus Mould aware. MD ok as long as MAP continues to be greater than or equal to 60. Pt alert and oriented.  Will continue to monitor.

## 2012-04-02 NOTE — Progress Notes (Signed)
1 Day Post-Op  Subjective: Pt doing well overnight.  Some nausea and pain.  Pain controlled with pain meds.   Objective: Vital signs in last 24 hours: Temp:  [96.7 F (35.9 C)-97.8 F (36.6 C)] 97.8 F (36.6 C) (03/10 0805) Pulse Rate:  [94-118] 111 (03/10 0800) Resp:  [8-27] 27 (03/10 0800) BP: (82-111)/(49-64) 91/58 mmHg (03/10 0800) SpO2:  [96 %-100 %] 99 % (03/10 0800) Weight:  [181 lb 7 oz (82.3 kg)] 181 lb 7 oz (82.3 kg) (03/10 0500) Last BM Date: 03/30/12  Intake/Output from previous day: 03/09 0701 - 03/10 0700 In: 3195.8 [I.V.:1525.8; NG/GT:270; IV Piggyback:1250] Out: 1900 [Urine:1435; Drains:450; Blood:15] Intake/Output this shift: Total I/O In: 70 [I.V.:50; NG/GT:20] Out: -   General appearance: alert and cooperative Cardio: RR, tachy GI: wound c/d/i, no drainage, soft, approp ttp, ND  Lab Results:  No results found for this basename: WBC, HGB, HCT, PLT,  in the last 72 hours BMET  Recent Labs  04/01/12 0436 04/02/12 0424  NA 143 142  K 4.0 3.8  CL 113* 115*  CO2 20 18*  GLUCOSE 95 103*  BUN 70* 69*  CREATININE 1.47* 1.37*  CALCIUM 7.7* 7.8*   PT/INR No results found for this basename: LABPROT, INR,  in the last 72 hours ABG No results found for this basename: PHART, PCO2, PO2, HCO3,  in the last 72 hours  Studies/Results: Dg Tibia/fibula Left  03/31/2012  *RADIOLOGY REPORT*  Clinical Data: Left tibia and fibula pain.  Evaluate for fracture.  LEFT TIBIA AND FIBULA - 2 VIEW  Comparison: None.  Findings:  No fracture or dislocation.  Regional soft tissues are normal.  Limited visualization of the adjacent knee suggest minimal enthesopathic change of the superior pole of the patella.  Right knee joint spaces appear grossly preserved.  No definite suprapatellar joint effusion.  Limited visualization of the adjacent ankle is normal.  IMPRESSION: No fracture.   Original Report Authenticated By: Jake Seats, MD    Dg Chest Port 1 View  03/31/2012   *RADIOLOGY REPORT*  Clinical Data: Evaluate left-sided pleural effusion  PORTABLE CHEST - 1 VIEW  Comparison: 03/27/2012; 03/26/2012; 03/24/2012  Findings: Grossly unchanged cardiac silhouette and mediastinal contours.  Interval extubation.  Stable position of support apparatus.  The suspected minimal increase in now likely moderate- sized left-sided pleural effusion.  Interval decrease with persistent possible trace right-sided pleural effusion.  Interval worsening of left mid and lower lung heterogeneous / consolidative opacity.  Improved aeration of the right lung base.  No definite evidence of pulmonary edema.  No pneumothorax.  Unchanged bones.  IMPRESSION: 1.  Interval extubation.  Otherwise, stable positioning of support apparatus.  No pneumothorax. 2.  Interval increase in now likely moderate-sized left-sided pleural effusion with corresponding increase in left mid and lower lung opacities, atelectasis versus infiltrate. 3.  Interval decrease in persistent likely trace right-sided pleural effusion with improved aeration of right lung base.   Original Report Authenticated By: Jake Seats, MD     Anti-infectives: Anti-infectives   Start     Dose/Rate Route Frequency Ordered Stop   04/01/12 1030  ciprofloxacin (CIPRO) IVPB 400 mg     400 mg 200 mL/hr over 60 Minutes Intravenous To Surgery 04/01/12 1028 04/01/12 1058   03/26/12 2100  vancomycin (VANCOCIN) IVPB 1000 mg/200 mL premix  Status:  Discontinued     1,000 mg 200 mL/hr over 60 Minutes Intravenous Every 24 hours 03/25/12 1934 03/28/12 0913   03/26/12 0945  ciprofloxacin (CIPRO) IVPB 400 mg  Status:  Discontinued     400 mg 200 mL/hr over 60 Minutes Intravenous Every 12 hours 03/26/12 0933 03/29/12 1331   03/26/12 0600  aztreonam (AZACTAM) 1 g in dextrose 5 % 50 mL IVPB  Status:  Discontinued     1 g 100 mL/hr over 30 Minutes Intravenous 3 times per day 03/25/12 1934 03/26/12 0929   03/25/12 2200  metroNIDAZOLE (FLAGYL) IVPB 500 mg   Status:  Discontinued     500 mg 100 mL/hr over 60 Minutes Intravenous Every 8 hours 03/25/12 1909 03/28/12 0913   03/25/12 2000  vancomycin (VANCOCIN) IVPB 1000 mg/200 mL premix     1,000 mg 200 mL/hr over 60 Minutes Intravenous STAT 03/25/12 1933 03/25/12 2054   03/25/12 2000  aztreonam (AZACTAM) 1 g in dextrose 5 % 50 mL IVPB     1 g 100 mL/hr over 30 Minutes Intravenous STAT 03/25/12 1933 03/25/12 2024   03/20/12 1145  ciprofloxacin (CIPRO) IVPB 400 mg    Comments:  ON CALL TO OR   400 mg 200 mL/hr over 60 Minutes Intravenous  Once 03/20/12 1143 03/20/12 1722   03/18/12 1000  vancomycin (VANCOCIN) 1,500 mg in sodium chloride 0.9 % 500 mL IVPB  Status:  Discontinued     1,500 mg 250 mL/hr over 120 Minutes Intravenous Every 48 hours 03/17/12 1920 03/19/12 0840   03/14/12 1000  micafungin (MYCAMINE) 100 mg in sodium chloride 0.9 % 100 mL IVPB  Status:  Discontinued     100 mg 100 mL/hr over 1 Hours Intravenous Daily 03/13/12 0632 03/19/12 0840   03/13/12 1000  micafungin (MYCAMINE) 100 mg in sodium chloride 0.9 % 100 mL IVPB     100 mg 100 mL/hr over 1 Hours Intravenous Daily 03/13/12 0632 03/13/12 1311   03/12/12 1800  vancomycin (VANCOCIN) IVPB 1000 mg/200 mL premix  Status:  Discontinued     1,000 mg 200 mL/hr over 60 Minutes Intravenous Every 24 hours 03/12/12 0859 03/17/12 1017   03/12/12 1000  imipenem-cilastatin (PRIMAXIN) 500 mg in sodium chloride 0.9 % 100 mL IVPB  Status:  Discontinued     500 mg 200 mL/hr over 30 Minutes Intravenous 3 times per day 03/12/12 0848 03/12/12 0909   03/12/12 1000  imipenem-cilastatin (PRIMAXIN) 250 mg in sodium chloride 0.9 % 100 mL IVPB  Status:  Discontinued     250 mg 200 mL/hr over 30 Minutes Intravenous 4 times per day 03/12/12 0909 03/19/12 0841   03/11/12 1200  vancomycin (VANCOCIN) 750 mg in sodium chloride 0.9 % 150 mL IVPB  Status:  Discontinued     750 mg 150 mL/hr over 60 Minutes Intravenous Every 12 hours 03/11/12 0204 03/12/12  0858   03/11/12 0215  vancomycin (VANCOCIN) 750 mg in sodium chloride 0.9 % 150 mL IVPB     750 mg 150 mL/hr over 60 Minutes Intravenous  Once 03/11/12 0204 03/11/12 0352   03/11/12 0215  aztreonam (AZACTAM) 1 g in dextrose 5 % 50 mL IVPB  Status:  Discontinued     1 g 100 mL/hr over 30 Minutes Intravenous 3 times per day 03/11/12 0204 03/12/12 0851   03/05/12 2200  metroNIDAZOLE (FLAGYL) IVPB 500 mg  Status:  Discontinued    Comments:  First dose ASAP   500 mg 100 mL/hr over 60 Minutes Intravenous Every 8 hours 03/05/12 1346 03/19/12 1509   03/02/12 1200  vancomycin (VANCOCIN) 50 mg/mL oral solution 500 mg  Status:  Discontinued  500 mg Oral 4 times per day 03/02/12 0913 03/19/12 1509   03/01/12 1445  vancomycin (VANCOCIN) 50 mg/mL oral solution 500 mg  Status:  Discontinued     500 mg Oral 3 times per day 03/01/12 1340 03/02/12 0913   03/01/12 1400  aztreonam (AZACTAM) 1 g in dextrose 5 % 50 mL IVPB  Status:  Discontinued     1 g 100 mL/hr over 30 Minutes Intravenous 3 times per day 03/01/12 1356 03/02/12 1146   03/01/12 1300  vancomycin (VANCOCIN) 1,250 mg in sodium chloride 0.9 % 250 mL IVPB  Status:  Discontinued     1,250 mg 166.7 mL/hr over 90 Minutes Intravenous Every 12 hours 03/01/12 1127 03/02/12 1146   02/29/12 0600  ciprofloxacin (CIPRO) IVPB 400 mg     400 mg 200 mL/hr over 60 Minutes Intravenous On call to O.R. 02/28/12 1409 02/28/12 1438   02/28/12 2200  vancomycin (VANCOCIN) 500 mg in sodium chloride irrigation 0.9 % 60 mL ENEMA  Status:  Discontinued     500 mg Rectal 2 times daily 02/28/12 1504 02/28/12 1817   02/28/12 1200  vancomycin (VANCOCIN) 50 mg/mL oral solution 500 mg  Status:  Discontinued    Comments:  First dose ASAP   500 mg Per Tube 4 times per day 02/28/12 0814 02/28/12 1817   02/28/12 1000  vancomycin (VANCOCIN) IVPB 1000 mg/200 mL premix  Status:  Discontinued     1,000 mg 200 mL/hr over 60 Minutes Intravenous Every 12 hours 02/28/12 0845 03/01/12  1127   02/28/12 1000  vancomycin (VANCOCIN) 500 mg in sodium chloride irrigation 0.9 % 100 mL ENEMA  Status:  Discontinued     500 mg Rectal 2 times daily 02/28/12 0923 02/28/12 1504   02/27/12 1600  metroNIDAZOLE (FLAGYL) IVPB 500 mg  Status:  Discontinued     500 mg 100 mL/hr over 60 Minutes Intravenous Every 8 hours 02/27/12 1546 02/27/12 2000   02/27/12 1600  ciprofloxacin (CIPRO) IVPB 400 mg  Status:  Discontinued     400 mg 200 mL/hr over 60 Minutes Intravenous Every 12 hours 02/27/12 1547 02/27/12 2000   02/27/12 1545  metroNIDAZOLE (FLAGYL) IVPB 500 mg  Status:  Discontinued     500 mg 100 mL/hr over 60 Minutes Intravenous  Once 02/27/12 1535 02/28/12 0843   02/27/12 1545  vancomycin (VANCOCIN) 500 mg in sodium chloride 0.9 % 100 mL IVPB  Status:  Discontinued     500 mg 100 mL/hr over 60 Minutes Intravenous  Once 02/27/12 1535 02/27/12 1538   02/27/12 1545  ciprofloxacin (CIPRO) IVPB 400 mg  Status:  Discontinued     400 mg 200 mL/hr over 60 Minutes Intravenous  Once 02/27/12 1538 02/28/12 0843   02/27/12 1415  vancomycin (VANCOCIN) 500 mg in sodium chloride 0.9 % 100 mL IVPB     500 mg 100 mL/hr over 60 Minutes Intravenous  Once 02/27/12 1402 02/27/12 1411   02/27/12 1400  vancomycin (VANCOCIN) powder 500 mg  Status:  Discontinued     500 mg Other To Surgery 02/27/12 1359 02/27/12 1402   02/27/12 1200  metroNIDAZOLE (FLAGYL) IVPB 500 mg  Status:  Discontinued    Comments:  First dose ASAP   500 mg 100 mL/hr over 60 Minutes Intravenous Every 6 hours 02/27/12 0953 03/05/12 1346   02/27/12 1200  vancomycin (VANCOCIN) 50 mg/mL oral solution 250 mg  Status:  Discontinued    Comments:  First dose ASAP   250 mg Oral  4 times per day 02/27/12 0953 02/28/12 0814   02/26/12 1700  oseltamivir (TAMIFLU) capsule 75 mg     75 mg Oral  Once 02/26/12 1651 02/26/12 1715      Assessment/Plan: s/p Procedure(s): removal of abdominal vac dressing and abdominal closure (N/A) -increase TF to  60cc/hr -dressing changes TID, wound looks good   LOS: 36 days    Rosario Jacks., Anne Hahn 04/02/2012

## 2012-04-02 NOTE — Progress Notes (Signed)
Physical Therapy Treatment Patient Details Name: ROBI WARSTLER MRN: [id number] DOB: 1960/11/13 Today's Date: 04/02/2012 Time: 0865-7846 PT Time Calculation (min): 32 min  PT Assessment / Plan / Recommendation Comments on Treatment Session  Pt s/p prolonged hospital stay with colectomy, VDRF, and multiple complications.  Will benefit from PT to address safety, endurance, balance and strength.  Will progress as able.      Follow Up Recommendations  CIR;Supervision/Assistance - 24 hour                 Equipment Recommendations  Other (comment) (TBA)    Recommendations for Other Services Rehab consult  Frequency Min 3X/week   Plan Discharge plan remains appropriate;Frequency remains appropriate    Precautions / Restrictions Precautions Precautions: Fall Precaution Comments: abdominal wound - placed binder on per MD order Restrictions Weight Bearing Restrictions: No   Pertinent Vitals/Pain VSS with slight c/o dizzines once in chair with BP slightly low, nursing aware.  No pain    Mobility  Bed Mobility Bed Mobility: Rolling Right;Rolling Left;Left Sidelying to Sit;Sitting - Scoot to [place] of Bed Rolling Right: 1: +1 Total assist Rolling Right: Patient Percentage: 10% Rolling Left: 1: +1 Total assist Rolling Left: Patient Percentage: 10% Left Sidelying to Sit: 1: +2 Total assist Left Sidelying to Sit: Patient Percentage: 10% Sitting - Scoot to Edge of Bed: 1: +2 Total assist Sitting - Scoot to Edge of Bed: Patient Percentage: 0% Sit to Supine: Not Tested (comment) Details for Bed Mobility Assistance: vc/tc's for direction/normalized movement; significant truncal assist/support Transfers Transfers: Sit to Stand;Stand to Sit;Squat Pivot Transfers Sit to Stand: 1: +2 Total assist;With upper extremity assist;From bed Sit to Stand: Patient Percentage: 30% Stand to Sit: 1: +2 Total assist;To chair/3-in-1;Without upper extremity assist Stand to Sit: Patient Percentage: 30% Squat  Pivot Transfers: 1: +2 Total assist Squat Pivot Transfers: Patient Percentage: 30% Details for Transfer Assistance: Pt unable to use UEs secondary to weakness.  Pt weight bearing on bil LEs for transfer with knees blocked and use of pad for hip extension and trunk extension.  Needed facilitation for all movement and unable to assist much at all.   Ambulation/Gait Ambulation/Gait Assistance: Not tested (comment) Stairs: No Wheelchair Mobility Wheelchair Mobility: No    Exercises General Exercises - Lower Extremity Ankle Circles/Pumps: AAROM;Both;10 reps;Supine Gluteal Sets: AAROM;Both;10 reps;Supine Heel Slides: AAROM;Both;10 reps;Supine Hip ABduction/ADduction: AAROM;Both;10 reps;Supine   PT Goals Acute Rehab PT Goals PT Goal: Supine/Side to Sit - Progress: Progressing toward goal PT Goal: Sit at Edge Of Bed - Progress: Progressing toward goal PT Goal: Sit to Stand - Progress: Progressing toward goal PT Transfer Goal: Bed to Chair/Chair to Bed - Progress: Progressing toward goal PT Goal: Perform Home Exercise Program - Progress: Progressing toward goal  Visit Information  Last PT Received On: 04/02/12 Assistance Needed: +2    Subjective Data  Subjective: "I want to get up."   Cognition  Cognition Overall Cognitive Status: Impaired Area of Impairment: Attention Difficult to assess due to: Impaired communication Arousal/Alertness: Awake/alert Orientation Level: Disoriented to;Time;Situation;Place Behavior During Session: [name] for tasks performed Current Attention Level: Sustained Attention - Other Comments: still needs frequent redirection Following Commands: Follows one step commands with increased time    Balance  Static Sitting Balance Static Sitting - Balance Support: Right upper extremity supported;Left upper extremity supported;Feet supported Static Sitting - Level of Assistance: 1: +2 Total assist;Patient percentage (comment) (pt = 10%) Static Sitting - Comment/# of  Minutes: Pt sat for 8 minutes at EOB  needing total assist of 2 with pt =10% to balance at EOB.  Poor trunk control.  Able to extend neck to command but not to neutral and could not maintain.  Forward head and shouders with kyphosis noted.  Pt unable to keep balance at EOB without assist. Dynamic Sitting Balance Dynamic Sitting - Level of Assistance: Not tested (comment)  End of Session PT - End of Session Equipment Utilized During Treatment: Gait belt Activity Tolerance: Patient limited by fatigue Patient left: in chair;with call bell/phone within reach;with nursing in room Nurse Communication: Mobility status;Need for lift equipment        [name],DAWN 04/02/2012, 4:33 PM [facility] Acute Rehabilitation [phone] (pager)

## 2012-04-02 NOTE — Progress Notes (Signed)
Speech Language Pathology Dysphagia Treatment Patient Details Name: Connor Small MRN: [id number] DOB: 01/06/1961 Today's Date: 04/02/2012 Time: 6073-7106 SLP Time Calculation (min): 14 min  Assessment / Plan / Recommendation Clinical Impression  Pts abdominal wound is now closed, swallow eval reordered by MD following two brief intubation trials. Pt very reluctant to attempt PO; only willing to attempt ice chips. After three trials pt became reportedly fatigued and did not accpet any further trials. SLP cued pt for multiple effortful swallows with a chin tuck. Though function appears overall unchanged, pt is slow to progress due to disinterest in PO and generalized weakness. Will f/u tomorrow for trials of puree to determine if a repeat FEES tomrorrow may be warranted to begin solids and liquids again. Pt agrees.     Diet Recommendation  Continue with Current Diet: NPO (ice chips)    SLP Plan Continue with current plan of care   Pertinent Vitals/Pain NA   Swallowing Goals  SLP Swallowing Goals Goal #3: Patient will demonstrate ability to close vocal cords with cough and with verbalizations with mod. verbal and tactile cues. Swallow Study Goal #3 - Progress: Progressing toward goal Goal #4: Pt will consume ice chips with swallow within 7 seconds of oral administration, 2 effortful swallows with max verbal cues.  Swallow Study Goal #4 - Progress: Progressing toward goal  General Temperature Spikes Noted: No Respiratory Status: Supplemental O2 delivered via (comment) Behavior/Cognition: Alert;Cooperative;Requires cueing Oral Cavity - Dentition: Adequate natural dentition Patient Positioning: Upright in chair  Oral Cavity - Oral Hygiene     Dysphagia Treatment Treatment focused on: Upgraded PO texture trials;Patient/family/caregiver education;Facilitation of pharyngeal phase Family/Caregiver Educated: wife Treatment Methods/Modalities: Skilled observation;Effortful swallow Patient  observed directly with PO's: Yes Type of PO's observed: Ice chips Feeding: Total assist Liquids provided via: Teaspoon Oral Phase Signs & Symptoms: Prolonged oral phase;Prolonged bolus formation Pharyngeal Phase Signs & Symptoms: Delayed throat clear;Wet vocal quality Type of cueing: Verbal;Tactile;Visual Amount of cueing: Moderate   GO    Connor [place], MA CCC-SLP [phone]  Connor Small 04/02/2012, 4:04 PM

## 2012-04-02 NOTE — Progress Notes (Signed)
Spoke with Dr. Conception Chancy regarding color change in stool this evening. Stool hemoccult did test positive this am. Stool appears to have brighter red blood this evening. Pt vitals consistent. Pt alert and oriented. Awaiting orders. Will continue to monitor.

## 2012-04-02 NOTE — Progress Notes (Signed)
Occupational Therapy Treatment Patient Details Name: Connor Small MRN: [id number] DOB: [birth year] Today's Date: 04/02/2012 Time: 7829-5621 OT Time Calculation (min): 51 min  OT Assessment / Plan / Recommendation Comments on Treatment Session Excellent session. Pt with apparent deficits with deep radial n distribution on R- unable to extend wrist,fingers or thumb or supinate. Pt also demonstrates deficits with elbow flexion. Facilitating B scapular and shoulder strengthening using PNF. Increased strength noted B shoulder girdle with decreased subluxation.  Educating wife on exercises and positioning. given handouts. Will continue to assess. Pt needs wrist cock up splint. will assess and most likely fabricate to accomodate adaptation for radial n palsy dynamic splint. will continue to follow.    Follow Up Recommendations  CIR    Barriers to Discharge       Equipment Recommendations  3 in 1 bedside comode;Tub/shower bench    Recommendations for Other Services Rehab consult  Frequency Min 3X/week   Plan Discharge plan remains appropriate    Precautions / Restrictions Precautions Precautions: Fall Precaution Comments: abdominal wound - placed binder on per MD order Restrictions Weight Bearing Restrictions: No   Pertinent Vitals/Pain no apparent distress     ADL       OT Diagnosis:    OT Problem List:   OT Treatment Interventions:     OT Goals Acute Rehab OT Goals OT Goal Formulation: Patient unable to participate in goal setting Time For Goal Achievement: 04/16/12 Potential to Achieve Goals: Good ADL Goals Pt Will Perform Grooming: with mod assist;Sitting, chair;Supported;with cueing (comment type and amount) ADL Goal: Grooming - Progress: Progressing toward goals Pt Will Perform Upper Body Bathing: with mod assist;Sitting, chair;Supported;with cueing (comment type and amount) ADL Goal: Upper Body Bathing - Progress: Progressing toward goals Additional ADL Goal #1: Pt will  complete bed mobility with mod A in preparation for ADL.  ADL Goal: Additional Goal #1 - Progress: Progressing toward goals Additional ADL Goal #2: Pt will sit EOB with min A supported in preparation for ADL ADL Goal: Additional Goal #2 - Progress: Progressing toward goals Arm Goals Pt Will Complete Theraputty Exer: to increase strength;Bilateral upper extremities;with minimal assist;Other (comment) (with wife independent with exercises) Arm Goal: Theraputty Exercises - Progress: Goal set today Additional Arm Goal #1: Wife will complete P/AAROM BUE with S Arm Goal: Additional Goal #1 - Progress: Progressing toward goals Additional Arm Goal #2: complete PNF D1/D2 patterns with LUE in preparation for ADL. Arm Goal: Additional Goal #2 - Progress: Goal set today Miscellaneous OT Goals Miscellaneous OT Goal #1: Wife will complete edema control techniques with S R hand OT Goal: Miscellaneous Goal #1 - Progress: Progressing toward goals  Visit Information  Last OT Received On: 04/02/12 Assistance Needed: +2    Subjective Data      Prior Functioning       Cognition  Cognition Overall Cognitive Status: Impaired Area of Impairment: Attention Difficult to assess due to: Impaired communication Arousal/Alertness: Awake/alert Orientation Level: Disoriented to;Time;Situation;Place Behavior During Session: [facility] for tasks performed Current Attention Level: Sustained Attention - Other Comments: still needs frequent redirection Following Commands: Follows one step commands with increased time    Mobility  Bed Mobility Bed Mobility: Rolling Right;Rolling Left;Left Sidelying to Sit;Sitting - Scoot to [place] of Bed Rolling Right: 1: +1 Total assist Rolling Right: Patient Percentage: 10% Rolling Left: 1: +1 Total assist Rolling Left: Patient Percentage: 10% Left Sidelying to Sit: 1: +2 Total assist Left Sidelying to Sit: Patient Percentage: 10% Sitting - Scoot to  Edge of Bed: 1: +2 Total  assist Sitting - Scoot to Edge of Bed: Patient Percentage: 0% Sit to Supine: Not Tested (comment) Details for Bed Mobility Assistance: vc/tc's for direction/normalized movement; significant truncal assist/support Transfers Sit to Stand: 1: +2 Total assist;With upper extremity assist;From bed Sit to Stand: Patient Percentage: 30% Stand to Sit: 1: +2 Total assist;To chair/3-in-1;Without upper extremity assist Stand to Sit: Patient Percentage: 30% Details for Transfer Assistance: Pt unable to use UEs secondary to weakness.  Pt weight bearing on bil LEs for transfer with knees blocked and use of pad for hip extension and trunk extension.  Needed facilitation for all movement and unable to assist much at all.      Exercises  General Exercises - Upper Extremity Shoulder Flexion: Both;10 reps;Seated;Supine Shoulder ABduction: Both;10 reps;Seated;Supine;Strengthening Elbow Flexion: Both;10 reps;Strengthening;Seated;Supine Elbow Extension: Strengthening;Both;10 reps;Supine;Seated Wrist Flexion: Strengthening;Both;10 reps;Seated Wrist Extension: Both;10 reps;Seated Digit Composite Flexion: Both;Squeeze ball;10 reps;Strengthening Composite Extension: Both;Seated General Exercises - Lower Extremity Ankle Circles/Pumps: AAROM;Both;10 reps;Supine Gluteal Sets: AAROM;Both;10 reps;Supine Heel Slides: AAROM;Both;10 reps;Supine Hip ABduction/ADduction: AAROM;Both;10 reps;Supine Other Exercises Other Exercises: Scapular strngthening all planes Other Exercises: theraputty exercises - soft/tan Other Exercises: table top exercises. shoulder protraction/retraction and elbow flex/ext Other Exercises: PNF ex B UE D1/D2 patterns supine   Balance Static Sitting Balance Static Sitting - Balance Support: Right upper extremity supported;Left upper extremity supported;Feet supported Static Sitting - Level of Assistance: 1: +2 Total assist;Patient percentage (comment) (pt = 10%) Static Sitting - Comment/# of  Minutes: Pt sat for 8 minutes at EOB needing total assist of 2 with pt =10% to balance at EOB.  Poor trunk control.  Able to extend neck to command but not to neutral and could not maintain.  Forward head and shouders with kyphosis noted.  Pt unable to keep balance at EOB without assist. Dynamic Sitting Balance Dynamic Sitting - Level of Assistance: Not tested (comment)   End of Session OT - End of Session Activity Tolerance: Patient tolerated treatment well Patient left: in chair;with call bell/phone within reach;with family/visitor present Nurse Communication: Mobility status  GO     [place],HILLARY 04/02/2012, 6:24 PM [facility], OTR/L  [phone] 04/02/2012

## 2012-04-02 NOTE — Progress Notes (Signed)
Ostomy fluid blood tinged   CBC ordered

## 2012-04-03 ENCOUNTER — Inpatient Hospital Stay (HOSPITAL_COMMUNITY): Payer: BC Managed Care – PPO

## 2012-04-03 LAB — CBC
HCT: 21.1 % — ABNORMAL LOW (ref 39.0–52.0)
HCT: 21.4 % — ABNORMAL LOW (ref 39.0–52.0)
Hemoglobin: 6.8 g/dL — CL (ref 13.0–17.0)
Hemoglobin: 7.1 g/dL — ABNORMAL LOW (ref 13.0–17.0)
Hemoglobin: 7.2 g/dL — ABNORMAL LOW (ref 13.0–17.0)
Hemoglobin: 8 g/dL — ABNORMAL LOW (ref 13.0–17.0)
MCH: 30.4 pg (ref 26.0–34.0)
MCH: 31.1 pg (ref 26.0–34.0)
MCH: 31.6 pg (ref 26.0–34.0)
MCHC: 32.9 g/dL (ref 30.0–36.0)
MCHC: 32.7 g/dL (ref 30.0–36.0)
MCV: 92.9 fL (ref 78.0–100.0)
MCV: 94.2 fL (ref 78.0–100.0)
MCV: 95.1 fL (ref 78.0–100.0)
Platelets: 99 10*3/uL — ABNORMAL LOW (ref 150–400)
RBC: 2.24 MIL/uL — ABNORMAL LOW (ref 4.22–5.81)
RBC: 2.25 MIL/uL — ABNORMAL LOW (ref 4.22–5.81)
RDW: 22.2 % — ABNORMAL HIGH (ref 11.5–15.5)
RDW: 22.4 % — ABNORMAL HIGH (ref 11.5–15.5)
WBC: 4.9 10*3/uL (ref 4.0–10.5)

## 2012-04-03 LAB — BASIC METABOLIC PANEL
CO2: 17 mEq/L — ABNORMAL LOW (ref 19–32)
Chloride: 119 mEq/L — ABNORMAL HIGH (ref 96–112)
Glucose, Bld: 122 mg/dL — ABNORMAL HIGH (ref 70–99)
Sodium: 145 mEq/L (ref 135–145)

## 2012-04-03 LAB — GLUCOSE, CAPILLARY
Glucose-Capillary: 113 mg/dL — ABNORMAL HIGH (ref 70–99)
Glucose-Capillary: 120 mg/dL — ABNORMAL HIGH (ref 70–99)
Glucose-Capillary: 77 mg/dL (ref 70–99)

## 2012-04-03 MED ORDER — DEXTROSE 5 % IV SOLN
INTRAVENOUS | Status: DC
  Administered 2012-04-03 – 2012-04-04 (×3): via INTRAVENOUS

## 2012-04-03 MED ORDER — HYDROCORTISONE SOD SUCCINATE 100 MG IJ SOLR
25.0000 mg | Freq: Two times a day (BID) | INTRAMUSCULAR | Status: DC
  Administered 2012-04-03: 25 mg via INTRAVENOUS
  Filled 2012-04-03 (×3): qty 0.5

## 2012-04-03 NOTE — Progress Notes (Signed)
2 Days Post-Op  Subjective: Pt doing well from abd wound standpoint.  GI bleed req transfusion. GI involved and EGD today  Objective: Vital signs in last 24 hours: Temp:  [97.2 F (36.2 C)-97.8 F (36.6 C)] 97.2 F (36.2 C) (03/11 0803) Pulse Rate:  [81-120] 85 (03/11 0800) Resp:  [9-26] 10 (03/11 0800) BP: (83-108)/(50-73) 97/60 mmHg (03/11 0800) SpO2:  [97 %-100 %] 100 % (03/11 0800) Last BM Date: 04/02/12  Intake/Output from previous day: 03/10 0701 - 03/11 0700 In: 2422.9 [I.V.:1362.9; Blood:350; NG/GT:660; IV Piggyback:50] Out: 1900 [Urine:980; Emesis/NG output:300; Stool:620] Intake/Output this shift:    General appearance: alert and cooperative GI: midline wound c/d/i  Lab Results:   Recent Labs  04/02/12 2355 04/03/12 0617  WBC 4.6 4.7  HGB 6.8* 8.0*  HCT 21.1* 24.3*  PLT 56* 50*   BMET  Recent Labs  04/02/12 0424 04/03/12 0400  NA 142 145  K 3.8 4.3  CL 115* 119*  CO2 18* 17*  GLUCOSE 103* 122*  BUN 69* 78*  CREATININE 1.37* 1.53*  CALCIUM 7.8* 8.0*   PT/INR  Recent Labs  04/02/12 2049  LABPROT 15.9*  INR 1.30   ABG No results found for this basename: PHART, PCO2, PO2, HCO3,  in the last 72 hours  Studies/Results: No results found.  Anti-infectives: Anti-infectives   Start     Dose/Rate Route Frequency Ordered Stop   04/01/12 1030  ciprofloxacin (CIPRO) IVPB 400 mg     400 mg 200 mL/hr over 60 Minutes Intravenous To Surgery 04/01/12 1028 04/01/12 1058   03/26/12 2100  vancomycin (VANCOCIN) IVPB 1000 mg/200 mL premix  Status:  Discontinued     1,000 mg 200 mL/hr over 60 Minutes Intravenous Every 24 hours 03/25/12 1934 03/28/12 0913   03/26/12 0945  ciprofloxacin (CIPRO) IVPB 400 mg  Status:  Discontinued     400 mg 200 mL/hr over 60 Minutes Intravenous Every 12 hours 03/26/12 0933 03/29/12 1331   03/26/12 0600  aztreonam (AZACTAM) 1 g in dextrose 5 % 50 mL IVPB  Status:  Discontinued     1 g 100 mL/hr over 30 Minutes Intravenous  3 times per day 03/25/12 1934 03/26/12 0929   03/25/12 2200  metroNIDAZOLE (FLAGYL) IVPB 500 mg  Status:  Discontinued     500 mg 100 mL/hr over 60 Minutes Intravenous Every 8 hours 03/25/12 1909 03/28/12 0913   03/25/12 2000  vancomycin (VANCOCIN) IVPB 1000 mg/200 mL premix     1,000 mg 200 mL/hr over 60 Minutes Intravenous STAT 03/25/12 1933 03/25/12 2054   03/25/12 2000  aztreonam (AZACTAM) 1 g in dextrose 5 % 50 mL IVPB     1 g 100 mL/hr over 30 Minutes Intravenous STAT 03/25/12 1933 03/25/12 2024   03/20/12 1145  ciprofloxacin (CIPRO) IVPB 400 mg    Comments:  ON CALL TO OR   400 mg 200 mL/hr over 60 Minutes Intravenous  Once 03/20/12 1143 03/20/12 1722   03/18/12 1000  vancomycin (VANCOCIN) 1,500 mg in sodium chloride 0.9 % 500 mL IVPB  Status:  Discontinued     1,500 mg 250 mL/hr over 120 Minutes Intravenous Every 48 hours 03/17/12 1920 03/19/12 0840   03/14/12 1000  micafungin (MYCAMINE) 100 mg in sodium chloride 0.9 % 100 mL IVPB  Status:  Discontinued     100 mg 100 mL/hr over 1 Hours Intravenous Daily 03/13/12 0632 03/19/12 0840   03/13/12 1000  micafungin (MYCAMINE) 100 mg in sodium chloride 0.9 % 100  mL IVPB     100 mg 100 mL/hr over 1 Hours Intravenous Daily 03/13/12 0632 03/13/12 1311   03/12/12 1800  vancomycin (VANCOCIN) IVPB 1000 mg/200 mL premix  Status:  Discontinued     1,000 mg 200 mL/hr over 60 Minutes Intravenous Every 24 hours 03/12/12 0859 03/17/12 1017   03/12/12 1000  imipenem-cilastatin (PRIMAXIN) 500 mg in sodium chloride 0.9 % 100 mL IVPB  Status:  Discontinued     500 mg 200 mL/hr over 30 Minutes Intravenous 3 times per day 03/12/12 0848 03/12/12 0909   03/12/12 1000  imipenem-cilastatin (PRIMAXIN) 250 mg in sodium chloride 0.9 % 100 mL IVPB  Status:  Discontinued     250 mg 200 mL/hr over 30 Minutes Intravenous 4 times per day 03/12/12 0909 03/19/12 0841   03/11/12 1200  vancomycin (VANCOCIN) 750 mg in sodium chloride 0.9 % 150 mL IVPB  Status:   Discontinued     750 mg 150 mL/hr over 60 Minutes Intravenous Every 12 hours 03/11/12 0204 03/12/12 0858   03/11/12 0215  vancomycin (VANCOCIN) 750 mg in sodium chloride 0.9 % 150 mL IVPB     750 mg 150 mL/hr over 60 Minutes Intravenous  Once 03/11/12 0204 03/11/12 0352   03/11/12 0215  aztreonam (AZACTAM) 1 g in dextrose 5 % 50 mL IVPB  Status:  Discontinued     1 g 100 mL/hr over 30 Minutes Intravenous 3 times per day 03/11/12 0204 03/12/12 0851   03/05/12 2200  metroNIDAZOLE (FLAGYL) IVPB 500 mg  Status:  Discontinued    Comments:  First dose ASAP   500 mg 100 mL/hr over 60 Minutes Intravenous Every 8 hours 03/05/12 1346 03/19/12 1509   03/02/12 1200  vancomycin (VANCOCIN) 50 mg/mL oral solution 500 mg  Status:  Discontinued     500 mg Oral 4 times per day 03/02/12 0913 03/19/12 1509   03/01/12 1445  vancomycin (VANCOCIN) 50 mg/mL oral solution 500 mg  Status:  Discontinued     500 mg Oral 3 times per day 03/01/12 1340 03/02/12 0913   03/01/12 1400  aztreonam (AZACTAM) 1 g in dextrose 5 % 50 mL IVPB  Status:  Discontinued     1 g 100 mL/hr over 30 Minutes Intravenous 3 times per day 03/01/12 1356 03/02/12 1146   03/01/12 1300  vancomycin (VANCOCIN) 1,250 mg in sodium chloride 0.9 % 250 mL IVPB  Status:  Discontinued     1,250 mg 166.7 mL/hr over 90 Minutes Intravenous Every 12 hours 03/01/12 1127 03/02/12 1146   02/29/12 0600  ciprofloxacin (CIPRO) IVPB 400 mg     400 mg 200 mL/hr over 60 Minutes Intravenous On call to O.R. 02/28/12 1409 02/28/12 1438   02/28/12 2200  vancomycin (VANCOCIN) 500 mg in sodium chloride irrigation 0.9 % 60 mL ENEMA  Status:  Discontinued     500 mg Rectal 2 times daily 02/28/12 1504 02/28/12 1817   02/28/12 1200  vancomycin (VANCOCIN) 50 mg/mL oral solution 500 mg  Status:  Discontinued    Comments:  First dose ASAP   500 mg Per Tube 4 times per day 02/28/12 0814 02/28/12 1817   02/28/12 1000  vancomycin (VANCOCIN) IVPB 1000 mg/200 mL premix  Status:   Discontinued     1,000 mg 200 mL/hr over 60 Minutes Intravenous Every 12 hours 02/28/12 0845 03/01/12 1127   02/28/12 1000  vancomycin (VANCOCIN) 500 mg in sodium chloride irrigation 0.9 % 100 mL ENEMA  Status:  Discontinued  500 mg Rectal 2 times daily 02/28/12 0923 02/28/12 1504   02/27/12 1600  metroNIDAZOLE (FLAGYL) IVPB 500 mg  Status:  Discontinued     500 mg 100 mL/hr over 60 Minutes Intravenous Every 8 hours 02/27/12 1546 02/27/12 2000   02/27/12 1600  ciprofloxacin (CIPRO) IVPB 400 mg  Status:  Discontinued     400 mg 200 mL/hr over 60 Minutes Intravenous Every 12 hours 02/27/12 1547 02/27/12 2000   02/27/12 1545  metroNIDAZOLE (FLAGYL) IVPB 500 mg  Status:  Discontinued     500 mg 100 mL/hr over 60 Minutes Intravenous  Once 02/27/12 1535 02/28/12 0843   02/27/12 1545  vancomycin (VANCOCIN) 500 mg in sodium chloride 0.9 % 100 mL IVPB  Status:  Discontinued     500 mg 100 mL/hr over 60 Minutes Intravenous  Once 02/27/12 1535 02/27/12 1538   02/27/12 1545  ciprofloxacin (CIPRO) IVPB 400 mg  Status:  Discontinued     400 mg 200 mL/hr over 60 Minutes Intravenous  Once 02/27/12 1538 02/28/12 0843   02/27/12 1415  vancomycin (VANCOCIN) 500 mg in sodium chloride 0.9 % 100 mL IVPB     500 mg 100 mL/hr over 60 Minutes Intravenous  Once 02/27/12 1402 02/27/12 1411   02/27/12 1400  vancomycin (VANCOCIN) powder 500 mg  Status:  Discontinued     500 mg Other To Surgery 02/27/12 1359 02/27/12 1402   02/27/12 1200  metroNIDAZOLE (FLAGYL) IVPB 500 mg  Status:  Discontinued    Comments:  First dose ASAP   500 mg 100 mL/hr over 60 Minutes Intravenous Every 6 hours 02/27/12 0953 03/05/12 1346   02/27/12 1200  vancomycin (VANCOCIN) 50 mg/mL oral solution 250 mg  Status:  Discontinued    Comments:  First dose ASAP   250 mg Oral 4 times per day 02/27/12 0953 02/28/12 0814   02/26/12 1700  oseltamivir (TAMIFLU) capsule 75 mg     75 mg Oral  Once 02/26/12 1651 02/26/12 1715       Assessment/Plan: s/p Procedure(s): removal of abdominal vac dressing and abdominal closure (N/A) -con't with daily dressing changes -will con't to follow  LOS: 37 days    Rosario Jacks., Armando 04/03/2012   LOS: 24 days    Rosario Jacks., Anne Hahn 04/03/2012

## 2012-04-03 NOTE — Progress Notes (Signed)
Clinical Social Worker staffed case during progression.  Pt remains in ICU.  CSW to continue to follow and assist as needed.   Dala Dock, MSW, [place]

## 2012-04-03 NOTE — Progress Notes (Signed)
SLP Cancellation Note  Patient Details Name: Connor Small MRN: [id number] DOB: 1960-07-27   Cancelled treatment:       Reason Eval/Treat Not Completed: Medical issues which prohibited therapy. SLP continue to follow pt for swallow therapy but pts progress limited by medical complications. SLP will continue efforts.    DeBlois, Katherene Ponto 04/03/2012, 1:09 PM

## 2012-04-03 NOTE — Progress Notes (Signed)
NUTRITION FOLLOW UP  Intervention:   When medically able, resume TF via NG tube with Vital AF 1.2 at 10 ml/h, increase by 10 ml every 4 hours to goal rate of 85 ml/h to provide 2448 kcals, 153 gm protein, 1654 ml free water daily.  Nutrition Dx:   Inadequate oral intake related to swallowing difficulty and altered GI function as evidenced by NPO status. Ongoing.  Goal:   Intake to meet >90% of estimated nutrition needs. unmet.  Monitor:   Toleration of TF, weight trends, labs, wound healing   Assessment:    S/P exploratory laparotomy 3/3 for wound VAC placement, no perforation or abscess found; wound VAC unable to be closed. Patient was extubated the morning of 3/4. NGT is in place for enteral nutrition. S/P removal of abdominal vac dressing and abdominal closure 3/11  3/11 TF held currently due to GI bleed. Dietetic Intern spoke with RN. Per RN, residuals not a problem and pt tolerating TF. Pt scheduled for endoscopy later this afternoon.      Height: Ht Readings from Last 1 Encounters:  03/25/12 5' 9"  (1.753 m)    Weight Status:   Wt Readings from Last 10 Encounters:  04/02/12 181 lb 7 oz (82.3 kg)  04/02/12 181 lb 7 oz (82.3 kg)  04/02/12 181 lb 7 oz (82.3 kg)  04/02/12 181 lb 7 oz (82.3 kg)  04/02/12 181 lb 7 oz (82.3 kg)  04/02/12 181 lb 7 oz (82.3 kg)  04/02/12 181 lb 7 oz (82.3 kg)  04/02/12 181 lb 7 oz (82.3 kg)  01/16/12 204 lb 1.6 oz (92.579 kg)  07/18/11 198 lb 11.2 oz (90.13 kg)  Admit wt 200 lb  Overall, weight is down from admission.  Body mass index is 26.78 kg/(m^2). Overweight    Re-estimated needs:  Kcal: 2300 - 2500 Protein: 140 - 160 gm   Fluid: >/= 2.3 L  Skin: Incisions to abdomen   Diet Order: NPO due to dysphagia    Intake/Output Summary (Last 24 hours) at 04/03/12 1205 Last data filed at 04/03/12 1000  Gross per 24 hour  Intake 2145.42 ml  Output   1515 ml  Net 630.42 ml    Last BM: 04/03/2012   Labs:   Recent Labs Lab  03/29/12 0420  03/31/12 0500 04/01/12 0436 04/02/12 0424 04/03/12 0400  NA 143  < > 143 143 142 145  K 3.9  < > 4.2 4.0 3.8 4.3  CL 114*  < > 114* 113* 115* 119*  CO2 21  < > 20 20 18* 17*  BUN 79*  < > 70* 70* 69* 78*  CREATININE 1.77*  < > 1.55* 1.47* 1.37* 1.53*  CALCIUM 7.9*  < > 8.1* 7.7* 7.8* 8.0*  MG 2.2  --  2.1  --   --   --   PHOS 4.7*  --  4.4  --   --   --   GLUCOSE 114*  < > 102* 95 103* 122*  < > = values in this interval not displayed.  CBG (last 3)   Recent Labs  04/02/12 2346 04/03/12 0410 04/03/12 0801  GLUCAP 113* 120* 117*    Scheduled Meds: . antiseptic oral rinse  15 mL Mouth Rinse q12n4p  . chlorhexidine  15 mL Mouth Rinse BID  . hydrocortisone sodium succinate  25 mg Intravenous Q12H  . sodium chloride  10-40 mL Intracatheter Q12H    Continuous Infusions: . dextrose 50 mL/hr at 04/03/12 1132  .  feeding supplement (VITAL AF 1.2 CAL) 1,000 mL (04/02/12 1013)  . pantoprozole (PROTONIX) infusion 8 mg/hr (04/03/12 0745)    Nelta Numbers  Dietetic Intern Pager: [phone]

## 2012-04-03 NOTE — Progress Notes (Signed)
[facility] PROGRESS NOTE Subjective we were asked to see the patient back about his liver test abnormalities in about G.I. bleeding. He has previously been on IV H2 blocker that had some darkish stool in his ostomy bag. He was in place back protonix trip. Currently, he has had no bleeding from his NG tube. He denies abdominal pain. Patients hemoglobin has come from 7.4 to 8.0 after transfusion.  Objective: Vital signs in last 24 hours: Temp:  [97.2 F (36.2 C)-97.8 F (36.6 C)] 97.4 F (36.3 C) (03/11 1130) Pulse Rate:  [81-116] 81 (03/11 1400) Resp:  [9-26] 12 (03/11 1400) BP: (83-113)/(50-73) 91/58 mmHg (03/11 1400) SpO2:  [97 %-100 %] 100 % (03/11 1400) Last BM Date: 04/03/12  Intake/Output from previous day: 03/10 0701 - 03/11 0700 In: 2422.9 [I.V.:1362.9; Blood:350; NG/GT:660; IV Piggyback:50] Out: 1900 [Urine:980; Emesis/NG output:300; Stool:620] Intake/Output this shift: Total I/O In: 651.5 [I.V.:427.5; Blood:224] Out: 175 [Urine:175]  PE: General -- patient is alert and responsive. NG tube is hoped to NG suction and his training clear bile Abdomen -- soft and nontender. Ileostomy training dark brown stool with no gross blood.  Lab Results:  Recent Labs  04/02/12 1158 04/02/12 1843 04/02/12 2355 04/03/12 0617 04/03/12 0833  WBC 6.3 5.3 4.6 4.7 4.9  HGB 8.3* 7.2* 6.8* 8.0* 7.4*  HCT 26.3* 22.6* 21.1* 24.3* 22.3*  PLT 59* 55* 56* 50* 99*   BMET  Recent Labs  04/01/12 0436 04/02/12 0424 04/03/12 0400  NA 143 142 145  K 4.0 3.8 4.3  CL 113* 115* 119*  CO2 20 18* 17*  CREATININE 1.47* 1.37* 1.53*   LFT  Recent Labs  04/01/12 0436  PROT 3.8*  AST 149*  ALT 162*  ALKPHOS 883*  BILITOT 14.3*   PT/INR  Recent Labs  04/02/12 2049  LABPROT 15.9*  INR 1.30   PANCREAS  Recent Labs  04/02/12 0424  LIPASE 34         Studies/Results: No results found.  Medications: I have reviewed the patient's current  medications.  Assessment/Plan: 1. G.I. bleeding. There's minimal blood in the ostomy bag and the NG tube is draining clear bile. It appears that transition to protonix has improved his bleeding. I'm hesitant to sedate him for endoscopy unless absolutely necessary. At this point, I will continue to protonix and follow his hemoglobin clinical course. Have discussed this with his wife. We will perform EGD if it appears that his bleeding is continuing. 2. Jaundice. I have reviewed his liver biopsy from 3/03 with pathology. There is no information in the liver and no signs of intrahepatic sclerosing cholangitis. There are bile lakes in the triad consistent with extra hepatic biliary obstruction. MRCP showing no picture or extra hepatic ductal dilatation in no signs of PSC or mechanical obstruction of extra hepatic bile ducts. There was a fluid collection around the spleen. Ultrasound done 2 weeks ago showed no dilated ducts. I think that we should repeat imaging of his liver to look at his biliary system to see if they are dilated ducts. We'll also try to look the spleen to see if this may be a large contributing to his thrombocytopenia.   Dorcas Melito JR,Jahmani Staup L 04/03/2012, 2:14 PM

## 2012-04-03 NOTE — Progress Notes (Signed)
UR Completed.  Vergie Living [phone] 04/03/2012

## 2012-04-03 NOTE — Progress Notes (Signed)
PULMONARY  / CRITICAL CARE MEDICINE  Name: Connor Small MRN: [id number] DOB: [birth year]    ADMISSION DATE:  02/26/2012 CONSULTATION DATE:  2/5  REFERRING MD :  Karilyn Cota  CHIEF COMPLAINT:  Acute resp failure   BRIEF PATIENT DESCRIPTION:  60 YOM admitted to [facility] for Cdiff on 2/2. Underwent decompressive cecostomy 2/3, initially was better, then worsened on 2/4 with increased abdominal distension and tenderness, decreased renal function.  He was taken back to the OR on 2/4 for total abdominal colectomy and end ileostomy.  Post op he developed ARDS and septic shock and was being treated for this.  The morning of 2/6 he had new onset of A-fib and transferred to [facility]   Course complicated by severe hypoalbuminemia, elevated LFTs  -cholestatic without biliary obstruction & pre-renal failure. Hypotensive 3/2 am requiring albumin  SIGNIFICANT EVENTS / STUDIES:  2/2 Admit to [facility] with C diff, toxic megacolon 2/3 Decompressive Cecostomy 2/4 Total abdominal colectomy and end ileostomy 2/6 New A-fib and transfer to [facility]. 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 2/10 CT abd/pelvis: small hematoma likely subcapsular spleen, JPs wnl 2/11 trial TFs  2/11 UE venous dopplers: Findings consistent with superficial vein thrombosis involving the right basilic vein 2/14 LE venous dopplers: No evidence of deep vein thrombosis 2/14 LUE venous doppler: No evidence of deep vein or superficial thrombosis involving the left upper extremity  2/15 neg 4 l bal on lasix drip, lasix d/c  2/16 poor neuro status, CT head: [facility] 2/17 CRRT initiated, high pressor needs 2/17 RUQ [place]: Gallbladder wall thickening in the absence of sludge or stones but no biliary ductal dilatation 2/18 reduced vasopressor reqts 2/19 New onset thrombocytopenia. Heparin D/C'd. Argatroban  initiated 2/19 [place] abd (repeat): No explanation for elevated liver function tests 2/20 reduction pressors, improved alertness, extubated 2/20 LE venous dopplers: No evidence of deep vein or superficial thrombosis 2/21 bleeding overnight from JP's, argatroban turned off, lower pressor needs 2/22 off pressors, on RA, converted to NSR 2/22 Carotid dopplers: No significant extracranial carotid artery stenosis demonstrated 2/25 Cystoscopy with bilateral retrograde pyelograms. No extravasation of contrast from either ureteral orifice. No evidence of hydroureteronephrosis. Unremarkable urinary bladder. B ureteral stents placed 2/27 MRI brain: Scattered small acute to subacute infarcts in both cerebral hemispheres, brainstem, and right cerebellum. Favor sequelae of emboli from a cardiac or proximal aortic source. Main alternate consideration is hypotensive episode 2/27 MRCP: No intrahepatic bile duct dilatation or common bile duct dilatation. No gallstones identified. Large subcapsular fluid collection encases the spleen. This is of indeterminate etiology. This may represent a subacute to chronic subcapsular hematoma or abscess. 2/28 SLP eval: FEES performed. Oropharyngeal weakness noted. Recommend replacement of NGT to supplement PO intake 3/1 Renal signed off 3/3- bowel evisceration, to OR 3/3- no perf / abscess, wound vac open could not close, Liver BX, some oozing, post op resp failure 3/3 echo repeat - improved ef55%, poor windows 3/4- low dose pressor needs 3/4- extubated, high output again from JP's 3/6- OR trip, partial closure wound 3/7- renal fxn continues to improve 3/8- doppler legs >>>Prelim NEG 3/9- hypotension, volume given 3/9- closed, retention service 3/10- GI bleed, required Transfusion, hemodynamics improved  LINES / TUBES: ETT ([facility]) 2/4>>> 2/20 PICC ([facility]) 2/4>>>2/11 Art Line ([facility]) 2/5>>>out L IJ CVL 2/06 >> out A line R fem 2/18>>>2/24 Rt ij HD 2/17 >> out  Left IJ 2/18 >>   ETT 3/3>>>out post op x 2  CULTURES: BCx2 2/4>>>negative BCx2 2/3>>>negative UC 2/3- Negative MRSA PCR 2/3- Negative C diff 2/6>>>Positive Body fluid 2/9>>>WBCs, no organisms 2/15 BC x 2 >> NEG 2/15 Sputum >>>NF 3/2 bc>>>NGTD 3/2 urine>>>neg 3/2 sputum>>>neg 3/5 cdiff>>>neg  PATH: 3/3 liver bx>>>BENIGN LIVER WITH CHOLESTASIS, The biopsy findings are that of extrahepatic biliary obstruction. No noted primary sclerosing cholangitis.  ANTIBIOTICS: Flagyl 2/3>> d/c'd Vanc 2/6 (IV) >>>2/7 azactam 2/6>>>2/7 Oral vanc 2/6>>>off IV vanc 2/16 >2/24 Aztreonam 2/16 >2/17 Imipenem 2/17>>>2/24 mycofungin 2/18>>>2/24  cipro 3/3>>>3/6 azactam 3/2>>>3/3 vanc 3/2>>>3/5 Flagyl 3/3>>>3/5  SUBJECTIVE: Hr better after prbc  VITAL SIGNS: Temp:  [97.2 F (36.2 C)-97.8 F (36.6 C)] 97.2 F (36.2 C) (03/11 0803) Pulse Rate:  [81-120] 85 (03/11 0800) Resp:  [9-26] 10 (03/11 0800) BP: (83-108)/(50-73) 97/60 mmHg (03/11 0800) SpO2:  [97 %-100 %] 100 % (03/11 0800)   INTAKE / OUTPUT: Intake/Output     03/10 0701 - 03/11 0700 03/11 0701 - 03/12 0700   I.V. (mL/kg) 1362.9 (16.6)    Blood 350    Other     NG/GT 660    IV Piggyback 50    Total Intake(mL/kg) 2422.9 (29.4)    Urine (mL/kg/hr) 980 (0.5)    Emesis/NG output 300 (0.2)    Drains     Stool 620 (0.3)    Blood     Total Output 1900     Net +522.9            PHYSICAL EXAMINATION: General Icteric unchanged Neuro: follows commands well, speaking stronger HEENT: Sclericterus, jvd about same Cardiovascular: RRR  s1 s2 Lungs: CTA, no distress Abdomen: soft, NT, BS wnl, closed abdo, dry Musculoskeletal: no edema daily , muscles weak  Skin:  No rash  LABS: BMET    Component Value Date/Time   NA 145 04/03/2012 0400   K 4.3 04/03/2012 0400   CL 119* 04/03/2012 0400   CO2 17* 04/03/2012 0400   GLUCOSE 122* 04/03/2012 0400   BUN 78* 04/03/2012 0400   CREATININE 1.53* 04/03/2012 0400   CALCIUM 8.0* 04/03/2012 0400    GFRNONAA 50* 04/03/2012 0400   GFRAA 58* 04/03/2012 0400    Hepatic Function Panel     Component Value Date/Time   PROT 3.8* 04/01/2012 0436   ALBUMIN 1.9* 04/01/2012 0436   AST 149* 04/01/2012 0436   ALT 162* 04/01/2012 0436   ALKPHOS 883* 04/01/2012 0436   BILITOT 14.3* 04/01/2012 0436   BILIDIR 14.0* 03/31/2012 0500   IBILI 1.3* 03/31/2012 0500    CBC    Component Value Date/Time   WBC 4.7 04/03/2012 0617   RBC 2.57* 04/03/2012 0617   HGB 8.0* 04/03/2012 0617   HCT 24.3* 04/03/2012 0617   PLT 50* 04/03/2012 0617   MCV 94.6 04/03/2012 0617   MCH 31.1 04/03/2012 0617   MCHC 32.9 04/03/2012 0617   RDW 21.9* 04/03/2012 0617   LYMPHSABS 0.3* 04/02/2012 1158   MONOABS 0.3 04/02/2012 1158   EOSABS 0.1 04/02/2012 1158   BASOSABS 0.0 04/02/2012 1158      Recent Labs Lab 04/02/12 1205 04/02/12 1517 04/02/12 1946 04/02/12 2346 04/03/12 0410  GLUCAP 112* 142* 146* 113* 120*    No results found.   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 Sedation for vent 3/3  Pain control an issue as abo closure  -PT -fent to q2h prn -Lactulose may consider dc with GI bleed and inr wnl, inate fxn wnl =-consider walk outside, was in chair    Severe muscle deconditioning   Shoulder subluxation, right - Cont PT/OT dopper neg, xray neg Leg pain -OT planned  -PT on going -may take him outside  GI Wound evisceration    Hyperbilirubinemia, due to cholestasis   Dysphagia due to oropharyngeal weakness Liver bx noted, cholestasis unclear etiology Wound closed 3/9 Gi bleed, r;o upper  -TF held for bleeding -repeat SLP after GI bleed investigated -bili follow up -zofran D/w GI, will consider scope Cbc q8h -ppi drip started, would like ot limit this with prior cdiff -plat Tx    Thrombocytopenia, unspecified, HIT panel negative     Coagulopathy - likely due to hepatic dysfunction  Anemia, hemoconcentration well No DIC -scd -trying to limit phlebotomy when able -push PT, was in chair -dc pepcid for good with plat trends anyway -plat Tx now with gi bleed -inr wnl = innate liver fxn wnl  Pulm    Pleural effusion, L > R Remaining effusion but no distress  -IS encouraged -in chair  Renal   ARF (acute renal failure), Cr leveling off -?now CKD   Hypovolemia 3/9, ATN 3/10 from GI bleed hypoK -Continue d5w, avoid k in bag -low threshold to increase rate d5w  Endocrine    Relative adrenal insufficy Continued stress, on stress steroids, await egd, limit to q12h  ID Resolved cdiff with MODS Bowel evisceration NO new source IDentified, likely was sirs with evisc -follow temp curve  Keep in icu until bleeding investigation  Resolved Problems:    history of UC (ulcerative colitis)   Atrial fibrillation, resolved   Acute respiratory failure, resolved   Septic shock, resolved   RUE DVT related to PICC, resolved   Hypokalemia, resolved   Hyperglycemia, resolved   Acute encephalopathy, resolved  Will update wife  Mcarthur Rossetti. Tyson Alias, MD, FACP Pgr: [phone] [facility]

## 2012-04-03 NOTE — Progress Notes (Signed)
Chaplain Note:  Chaplain visited with pt and pt's family.  Pt was in resting bed, awake, and alert.  Family was at bedside.  Chaplain provided spiritual comfort, support, and prayer for pt and pt's family.  Pt and pt's family expressed appreciation for chaplain support.  Chaplain will follow up as needed.  04/03/12 1100  Clinical Encounter Type  Visited With Patient and family together  Visit Type Spiritual support;Follow-up  Referral From Other (Comment) (Self referral)  Spiritual Encounters  Spiritual Needs Emotional;Prayer  Stress Factors  Patient Stress Factors Health changes;Major life changes  Family Stress Factors Exhausted;Major life changes  Jearld Lesch, [place] [phone]

## 2012-04-03 NOTE — Progress Notes (Signed)
I agree with student dietitian note.  Molli Barrows, RD, LDN, [place] Pager# [phone] After Hours Pager# [phone]

## 2012-04-03 NOTE — Progress Notes (Signed)
eLink Physician-Brief Progress Note Patient Name: Connor Small DOB: 27-Jun-1960 MRN: [id number]  Date of Service  04/03/2012   HPI/Events of Note  Patient Heme positive with drop in Hgb from 7.2 to 6.8.   eICU Interventions  Plan: Transfuse 1 unit of pRBC Will f/u with Hgb with AM labs   Intervention Category Intermediate Interventions: Bleeding - evaluation and treatment with blood products  DETERDING,ELIZABETH 04/03/2012, 12:46 AM

## 2012-04-03 NOTE — Progress Notes (Signed)
Physical Therapy Treatment Patient Details Name: Connor Small MRN: [id number] DOB: July 18, 1960 Today's Date: 04/03/2012 Time: 7001-7494 PT Time Calculation (min): 25 min  PT Assessment / Plan / Recommendation Comments on Treatment Session  Pt s/p prolonged hospital stay with colectomy, VDRF, and multiple complications.  Will benefit from PT to address safety, endurance, balance and strength.  Will progress as able.      Follow Up Recommendations  CIR;Supervision/Assistance - 24 hour                 Equipment Recommendations  None recommended by PT    Recommendations for Other Services Rehab consult  Frequency Min 3X/week   Plan Discharge plan remains appropriate;Frequency remains appropriate    Precautions / Restrictions Precautions Precautions: Fall Precaution Comments: abdominal wound - placed binder on per MD order Restrictions Weight Bearing Restrictions: No   Pertinent Vitals/Pain VSS, Some pain    Mobility  Bed Mobility Bed Mobility: Rolling Right;Rolling Left;Left Sidelying to Sit;Sitting - Scoot to [employer] of Bed Rolling Right: 1: +1 Total assist Rolling Right: Patient Percentage: 10% Rolling Left: 1: +1 Total assist Rolling Left: Patient Percentage: 10% Left Sidelying to Sit: 1: +2 Total assist Left Sidelying to Sit: Patient Percentage: 10% Sitting - Scoot to Edge of Bed: 1: +2 Total assist Sitting - Scoot to Edge of Bed: Patient Percentage: 0% Sit to Supine: 1: +2 Total assist Sit to Supine: Patient Percentage: 0% Details for Bed Mobility Assistance: vc/tc's for direction/normalized movement; significant truncal assist/support Transfers Transfers: Not assessed Ambulation/Gait Ambulation/Gait Assistance: Not tested (comment) Stairs: No Wheelchair Mobility Wheelchair Mobility: No    Exercises General Exercises - Lower Extremity Ankle Circles/Pumps: AAROM;Both;10 reps;Supine Long Arc Quad: AAROM;Both;Seated;5 reps   PT Goals Acute Rehab PT Goals PT  Goal: Supine/Side to Sit - Progress: Progressing toward goal PT Goal: Sit at [facility] Bed - Progress: Progressing toward goal  Visit Information  Last PT Received On: 04/03/12 Assistance Needed: +2    Subjective Data  Subjective: "I feel so weak."   Cognition  Cognition Overall Cognitive Status: Impaired Area of Impairment: Attention Difficult to assess due to: Impaired communication Arousal/Alertness: Awake/alert Orientation Level: Disoriented to;Time;Situation;Place Behavior During Session: [facility] for tasks performed Current Attention Level: Sustained Attention - Other Comments: still needs frequent redirection Following Commands: Follows one step commands with increased time    Balance  Static Sitting Balance Static Sitting - Balance Support: Right upper extremity supported;Left upper extremity supported;Feet supported Static Sitting - Level of Assistance: 1: +2 Total assist;Patient percentage (comment);Other (comment) (pt=10%) Static Sitting - Comment/# of Minutes: Pt sat for 10 minutes EOB needing total assist of 2 with pt =10% to balance at EOB.  Poor trunk control.  Worked on head and neck control with pt able to extend neck to neutral with assist and cues but could not maintain in neutral greater than 15 seconds.  Forward head and shoulders with kyphosis noted.  Unable to keep balance at EOB without constant assist.  Pt performed some LE exercises in sitting.   Dynamic Sitting Balance Dynamic Sitting - Level of Assistance: Not tested (comment)  End of Session PT - End of Session Activity Tolerance: Patient limited by fatigue Patient left: in bed;with call bell/phone within reach Nurse Communication: Mobility status;Need for lift equipment       [name],Amos Micheals 04/03/2012, 9:46 AM [facility] [phone] (pager)

## 2012-04-03 NOTE — Progress Notes (Signed)
US of the abdomen reviewed;   There is a complex fluid collection surrounding the spleen of 14.0 x 6.0 x 7.6  cm. ? Loculated parasplenic ascites vs subcapsular splenic hematoma.   Hg now stable, the patient hemodynamically stable.   The results reviewed with Dr. Brantley Stage; he recommended medical monitoring; considering CT abdomen for declining Hg or hemodynamic instability.

## 2012-04-04 LAB — COMPREHENSIVE METABOLIC PANEL
BUN: 69 mg/dL — ABNORMAL HIGH (ref 6–23)
CO2: 18 mEq/L — ABNORMAL LOW (ref 19–32)
Chloride: 118 mEq/L — ABNORMAL HIGH (ref 96–112)
Creatinine, Ser: 1.52 mg/dL — ABNORMAL HIGH (ref 0.50–1.35)
GFR calc non Af Amer: 50 mL/min — ABNORMAL LOW (ref >90)
Glucose, Bld: 91 mg/dL (ref 70–99)
Total Bilirubin: 7.2 mg/dL — ABNORMAL HIGH (ref 0.3–1.2)

## 2012-04-04 LAB — CBC
HCT: 21.2 % — ABNORMAL LOW (ref 39.0–52.0)
HCT: 21.6 % — ABNORMAL LOW (ref 39.0–52.0)
Hemoglobin: 7 g/dL — ABNORMAL LOW (ref 13.0–17.0)
Hemoglobin: 7.2 g/dL — ABNORMAL LOW (ref 13.0–17.0)
MCH: 30.8 pg (ref 26.0–34.0)
MCH: 31.4 pg (ref 26.0–34.0)
MCHC: 32.5 g/dL (ref 30.0–36.0)
MCHC: 33.3 g/dL (ref 30.0–36.0)
RDW: 22.2 % — ABNORMAL HIGH (ref 11.5–15.5)
RDW: 22.5 % — ABNORMAL HIGH (ref 11.5–15.5)

## 2012-04-04 LAB — PREPARE PLATELET PHERESIS

## 2012-04-04 LAB — GLUCOSE, CAPILLARY
Glucose-Capillary: 80 mg/dL (ref 70–99)
Glucose-Capillary: 84 mg/dL (ref 70–99)

## 2012-04-04 MED ORDER — FENTANYL CITRATE 0.05 MG/ML IJ SOLN
25.0000 ug | INTRAMUSCULAR | Status: DC | PRN
  Administered 2012-04-04 – 2012-04-09 (×43): 50 ug via INTRAVENOUS
  Filled 2012-04-04 (×43): qty 2

## 2012-04-04 MED ORDER — BIOTENE DRY MOUTH MT LIQD
15.0000 mL | Freq: Two times a day (BID) | OROMUCOSAL | Status: DC
  Administered 2012-04-05 – 2012-04-17 (×25): 15 mL via OROMUCOSAL

## 2012-04-04 MED ORDER — CHLORHEXIDINE GLUCONATE 0.12 % MT SOLN
15.0000 mL | Freq: Two times a day (BID) | OROMUCOSAL | Status: DC
  Administered 2012-04-05 – 2012-04-17 (×25): 15 mL via OROMUCOSAL
  Filled 2012-04-04 (×27): qty 15

## 2012-04-04 MED ORDER — SODIUM BICARBONATE 8.4 % IV SOLN
INTRAVENOUS | Status: DC
  Administered 2012-04-04 – 2012-04-05 (×2): via INTRAVENOUS
  Filled 2012-04-04 (×6): qty 100

## 2012-04-04 MED ORDER — HYDROCORTISONE SOD SUCCINATE 100 MG IJ SOLR
25.0000 mg | Freq: Every day | INTRAMUSCULAR | Status: DC
  Administered 2012-04-04 – 2012-04-05 (×2): 25 mg via INTRAVENOUS
  Filled 2012-04-04 (×2): qty 0.5

## 2012-04-04 MED ORDER — FENTANYL 25 MCG/HR TD PT72
25.0000 ug | MEDICATED_PATCH | TRANSDERMAL | Status: DC
  Administered 2012-04-04 – 2012-04-07 (×2): 25 ug via TRANSDERMAL
  Filled 2012-04-04 (×2): qty 1

## 2012-04-04 MED ORDER — CHLORHEXIDINE GLUCONATE 0.12 % MT SOLN: 15.0000 mL | Freq: Two times a day (BID) | OROMUCOSAL | Status: DC

## 2012-04-04 MED ORDER — VITAL AF 1.2 CAL PO LIQD
1000.0000 mL | ORAL | Status: DC
  Administered 2012-04-04 – 2012-04-11 (×9): 1000 mL
  Filled 2012-04-04 (×22): qty 1000

## 2012-04-04 MED ORDER — SODIUM CHLORIDE 0.45 % IV SOLN
INTRAVENOUS | Status: DC | PRN
  Administered 2012-04-04 – 2012-04-06 (×3): via INTRAVENOUS
  Administered 2012-04-07: 500 mL via INTRAVENOUS
  Administered 2012-04-10: 20 mL/h via INTRAVENOUS

## 2012-04-04 NOTE — Progress Notes (Signed)
Clinical Social Worker staffed case with RN.  CSW to continue to follow and assist as needed.   Dala Dock, MSW, [place]

## 2012-04-04 NOTE — Progress Notes (Signed)
NUTRITION  CPNSULT / FOLLOW UP  Intervention:    Resume TF via NG tube with Vital AF 1.2 at 10 ml/h, increase by 10 ml every 4 hours to goal rate of 85 ml/h to provide 2448 kcals, 153 gm protein, 1654 ml free water daily.  Nutrition Dx:   Inadequate oral intake related to swallowing difficulty and altered GI function as evidenced by NPO status, ongoing.  Goal: Intake to meet >90% of estimated nutrition needs, unmet.  Monitor:   TF tolerance, weight trend, labs, wound healing.  Assessment: Discussed with RN plans to resume TF today per CCM and GI teams.  Consult entered for TF initiation and management per RD.  TF has been on hold due to GI bleed.  Per GI note today, bleeding has stopped.   No plans for EGD at this time unless he begins to have active bleeding.      Height: Ht Readings from Last 1 Encounters:  03/25/12 5' 9"  (1.753 m)    Weight Status:   Wt Readings from Last 1 Encounters:  04/02/12 181 lb 7 oz (82.3 kg)  03/26/12  177 lb 4 oz (80.4 kg)  03/22/12  186 lb 8.2 oz (84.6 kg)  03/21/12  179 lb 7.3 oz (81.4 kg)  03/16/12  192 lb 0.3 oz (87.1 kg)  Admit wt 200 lb   Body mass index is 26.78 kg/(m^2). Overweight.  Re-estimated needs:  Kcal: 2300 - 2500  Protein: 140 - 160 gm  Fluid: >/= 2.3 L  Skin: abdominal incision/wound closed in OR 3/11  Diet Order: NPO     Intake/Output Summary (Last 24 hours) at 04/04/12 1542 Last data filed at 04/04/12 1245  Gross per 24 hour  Intake 1593.33 ml  Output   1150 ml  Net 443.33 ml    Last BM: 3/11  Labs:   Recent Labs Lab 03/29/12 0420  03/31/12 0500  04/02/12 0424 04/03/12 0400 04/04/12 0500  NA 143  < > 143  < > 142 145 145  K 3.9  < > 4.2  < > 3.8 4.3 3.6  CL 114*  < > 114*  < > 115* 119* 118*  CO2 21  < > 20  < > 18* 17* 18*  BUN 79*  < > 70*  < > 69* 78* 69*  CREATININE 1.77*  < > 1.55*  < > 1.37* 1.53* 1.52*  CALCIUM 7.9*  < > 8.1*  < > 7.8* 8.0* 7.8*  MG 2.2  --  2.1  --   --   --   --    PHOS 4.7*  --  4.4  --   --   --   --   GLUCOSE 114*  < > 102*  < > 103* 122* 91  < > = values in this interval not displayed.  CBG (last 3)   Recent Labs  04/04/12 0342 04/04/12 0721 04/04/12 1150  GLUCAP 87 84 84    Scheduled Meds: . antiseptic oral rinse  15 mL Mouth Rinse q12n4p  . chlorhexidine  15 mL Mouth Rinse BID  . fentaNYL  25 mcg Transdermal Q72H  . hydrocortisone sodium succinate  25 mg Intravenous Daily  . sodium chloride  10-40 mL Intracatheter Q12H    Continuous Infusions: . sodium chloride 20 mL/hr at 04/04/12 1006  . pantoprozole (PROTONIX) infusion 8 mg/hr (04/04/12 0700)  .  sodium bicarbonate  infusion 1000 mL 75 mL/hr at 04/04/12 0957     Molli Barrows, RD,  LDN, [place] Pager# [phone] After Hours Pager# [phone]

## 2012-04-04 NOTE — Progress Notes (Signed)
Physical Therapy Treatment Patient Details Name: Connor Small MRN: [id number] DOB: 1960/08/16 Today's Date: 04/04/2012 Time: 4010-2725 PT Time Calculation (min): 24 min  PT Assessment / Plan / Recommendation Comments on Treatment Session  Pt s/p prolonged hospital stay with colectomy, VDRF, and multiple complications.  Will benefit from PT to address safety, endurance, balance and strength.  Will progress as able.      Follow Up Recommendations  CIR;Supervision/Assistance - 24 hour                 Equipment Recommendations  None recommended by PT    Recommendations for Other Services Rehab consult  Frequency Min 3X/week   Plan Discharge plan remains appropriate;Frequency remains appropriate    Precautions / Restrictions Precautions Precautions: Fall Precaution Comments: abdominal wound - placed binder on per MD order Restrictions Weight Bearing Restrictions: No   Pertinent Vitals/Pain VSS, No pain    Mobility  Bed Mobility Bed Mobility: Rolling Right;Rolling Left;Left Sidelying to Sit;Sitting - Scoot to [employer] of Bed Rolling Right: 1: +1 Total assist Rolling Right: Patient Percentage: 10% Rolling Left: 1: +1 Total assist Rolling Left: Patient Percentage: 10% Left Sidelying to Sit: 1: +2 Total assist Left Sidelying to Sit: Patient Percentage: 10% Sitting - Scoot to Edge of Bed: 1: +2 Total assist Sitting - Scoot to Edge of Bed: Patient Percentage: 0% Sit to Supine: Not Tested (comment) Details for Bed Mobility Assistance: vc/tc's for direction/normalized movement; significant truncal assist/support Transfers Transfers: Not assessed Sit to Stand: 1: +2 Total assist;With upper extremity assist;From bed Sit to Stand: Patient Percentage: 30% Stand to Sit: 1: +2 Total assist;To chair/3-in-1;Without upper extremity assist Stand to Sit: Patient Percentage: 30% Squat Pivot Transfers: 1: +2 Total assist Squat Pivot Transfers: Patient Percentage: 30% Details for Transfer  Assistance: Pt unable to use UEs secondary to weakness.  Pt weight bearing on bil LEs for transfer with knees blocked and use of pad for hip extension and trunk extension.  Needed facilitation for all movement and unable to assist much at all.   Ambulation/Gait Ambulation/Gait Assistance: Not tested (comment) Stairs: No Wheelchair Mobility Wheelchair Mobility: No    Exercises General Exercises - Upper Extremity Shoulder Flexion: Both;10 reps;Seated;Supine;AAROM Shoulder ABduction: Both;10 reps;Seated;Supine;Strengthening;AAROM Elbow Flexion: Both;10 reps;Strengthening;Seated;Supine;AAROM Elbow Extension: Strengthening;Both;10 reps;Supine;Seated;AAROM General Exercises - Lower Extremity Ankle Circles/Pumps: AAROM;Both;10 reps;Supine Quad Sets: AROM;Both;10 reps;Supine;Seated Heel Slides: AAROM;Both;10 reps;Supine;Seated   PT Goals Acute Rehab PT Goals PT Goal: Supine/Side to Sit - Progress: Progressing toward goal PT Goal: Sit at Edge Of Bed - Progress: Progressing toward goal PT Goal: Sit to Stand - Progress: Progressing toward goal PT Transfer Goal: Bed to Chair/Chair to Bed - Progress: Progressing toward goal PT Goal: Perform Home Exercise Program - Progress: Progressing toward goal  Visit Information  Last PT Received On: 04/04/12 Assistance Needed: +2    Subjective Data  Subjective: "I can't do this."   Cognition  Cognition Overall Cognitive Status: Impaired Area of Impairment: Attention Arousal/Alertness: Awake/alert Orientation Level: Disoriented to;Time;Situation;Place Behavior During Session: [facility] for tasks performed Current Attention Level: Sustained Attention - Other Comments: still needs frequent redirection Following Commands: Follows one step commands with increased time    Balance  Balance Balance Assessed: No Static Sitting Balance Static Sitting - Balance Support: Right upper extremity supported;Left upper extremity supported;Feet supported Static  Sitting - Level of Assistance: 1: +2 Total assist;Patient percentage (comment);Other (comment) (pt=10%) Static Sitting - Comment/# of Minutes: Pt sat for 3 minutes EOB needing total assist of 2 with pt =  10% to balance at EOB.  Poor trunk control.  Worked on head and neck control with pt able to extend neck close to neutral with assist and cues but could not maintain in neutral greater than 5 seconds.  Forward head and shoulders with kyphosis noted.   Dynamic Sitting Balance Dynamic Sitting - Level of Assistance: Not tested (comment)  End of Session PT - End of Session Equipment Utilized During Treatment: Gait belt Activity Tolerance: Patient limited by fatigue Patient left: with call bell/phone within reach;in chair Nurse Communication: Mobility status;Need for lift equipment        [name],Taelyn Broecker 04/04/2012, 12:51 PM [facility] [phone] (pager)

## 2012-04-04 NOTE — Progress Notes (Signed)
Chaplain Note:  Chaplain visited with pt.  Pt was sitting up in a recliner, awake, alert, and oriented.  Chaplain provided spiritual comfort, support, and prayer for pt.  Pt expressed appreciation for chaplain support.   Spiritual Assessment: Pt is experiencing weariness and low spirits.  He is tired from his long hospitalization.  He does, however, visualize himself as getting well and leaving the hospital.  Chaplain will continue to follow this pt.   04/04/12 1200  Clinical Encounter Type  Visited With Patient  Visit Type Follow-up;Spiritual support  Referral From Nurse  Spiritual Encounters  Spiritual Needs Emotional;Prayer  Stress Factors  Patient Stress Factors Exhausted;Health changes;Major life changes  Family Stress Factors Not reviewed (No family present at this time)   Jearld Lesch, Chaplain [phone]

## 2012-04-04 NOTE — Progress Notes (Signed)
PULMONARY  / CRITICAL CARE MEDICINE  Name: Connor Small MRN: [id number] DOB: August 10, 1960    ADMISSION DATE:  02/26/2012 CONSULTATION DATE:  2/5  REFERRING MD :  Karilyn Cota  CHIEF COMPLAINT:  Acute resp failure   BRIEF PATIENT DESCRIPTION:  22 YOM admitted to [facility] for Cdiff on 2/2. Underwent decompressive cecostomy 2/3, initially was better, then worsened on 2/4 with increased abdominal distension and tenderness, decreased renal function.  He was taken back to the OR on 2/4 for total abdominal colectomy and end ileostomy.  Post op he developed ARDS and septic shock and was being treated for this.  The morning of 2/6 he had new onset of A-fib and transferred to [facility].   Course complicated by severe hypoalbuminemia, elevated LFTs  -cholestatic without biliary obstruction & pre-renal failure. Hypotensive 3/2 am requiring albumin  SIGNIFICANT EVENTS / STUDIES:  2/2 Admit to [facility] with C diff, toxic megacolon 2/3 Decompressive Cecostomy 2/4 Total abdominal colectomy and end ileostomy 2/6 New A-fib and transfer to [facility] health. 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 2/10 CT abd/pelvis: small hematoma likely subcapsular spleen, JPs wnl 2/11 trial TFs  2/11 UE venous dopplers: Findings consistent with superficial vein thrombosis involving the right basilic vein 2/14 LE venous dopplers: No evidence of deep vein thrombosis 2/14 LUE venous doppler: No evidence of deep vein or superficial thrombosis involving the left upper extremity  2/15 neg 4 l bal on lasix drip, lasix d/c  2/16 poor neuro status, CT head: [facility] 2/17 CRRT initiated, high pressor needs 2/17 RUQ [place]: Gallbladder wall thickening in the absence of sludge or stones but no biliary ductal dilatation 2/18 reduced vasopressor reqts 2/19 New onset thrombocytopenia. Heparin D/C'd. Argatroban  initiated 2/19 [place] abd (repeat): No explanation for elevated liver function tests 2/20 reduction pressors, improved alertness, extubated 2/20 LE venous dopplers: No evidence of deep vein or superficial thrombosis 2/21 bleeding overnight from JP's, argatroban turned off, lower pressor needs 2/22 off pressors, on RA, converted to NSR 2/22 Carotid dopplers: No significant extracranial carotid artery stenosis demonstrated 2/25 Cystoscopy with bilateral retrograde pyelograms. No extravasation of contrast from either ureteral orifice. No evidence of hydroureteronephrosis. Unremarkable urinary bladder. B ureteral stents placed 2/27 MRI brain: Scattered small acute to subacute infarcts in both cerebral hemispheres, brainstem, and right cerebellum. Favor sequelae of emboli from a cardiac or proximal aortic source. Main alternate consideration is hypotensive episode 2/27 MRCP: No intrahepatic bile duct dilatation or common bile duct dilatation. No gallstones identified. Large subcapsular fluid collection encases the spleen. This is of indeterminate etiology. This may represent a subacute to chronic subcapsular hematoma or abscess. 2/28 SLP eval: FEES performed. Oropharyngeal weakness noted. Recommend replacement of NGT to supplement PO intake 3/1 Renal signed off 3/3- bowel evisceration, to OR 3/3- no perf / abscess, wound vac open could not close, Liver BX, some oozing, post op resp failure 3/3 echo repeat - improved ef55%, poor windows 3/4- low dose pressor needs 3/4- extubated, high output again from JP's 3/6- OR trip, partial closure wound 3/7- renal fxn continues to improve 3/8- doppler legs >>>Prelim NEG 3/9- hypotension, volume given 3/9- closed, retention service 3/10- GI bleed, required Transfusion, hemodynamics improved, ppi drip 3/12- bili down  LINES / TUBES: ETT ([facility]) 2/4>>> 2/20 PICC ([facility]) 2/4>>>2/11 Art Line ([facility]) 2/5>>>out L IJ CVL 2/06 >> out A line R fem 2/18>>>2/24 Rt  ij HD  2/17 >> out Left IJ 2/18 >>  ETT 3/3>>>out post op x 2  CULTURES: BCx2 2/4>>>negative BCx2 2/3>>>negative UC 2/3- Negative MRSA PCR 2/3- Negative C diff 2/6>>>Positive Body fluid 2/9>>>WBCs, no organisms 2/15 BC x 2 >> NEG 2/15 Sputum >>>NF 3/2 bc>>>NGTD 3/2 urine>>>neg 3/2 sputum>>>neg 3/5 cdiff>>>neg  PATH: 3/3 liver bx>>>BENIGN LIVER WITH CHOLESTASIS, The biopsy findings are that of extrahepatic biliary obstruction. No noted primary sclerosing cholangitis.  ANTIBIOTICS: Flagyl 2/3>> d/c'd Vanc 2/6 (IV) >>>2/7 azactam 2/6>>>2/7 Oral vanc 2/6>>>off IV vanc 2/16 >2/24 Aztreonam 2/16 >2/17 Imipenem 2/17>>>2/24 mycofungin 2/18>>>2/24  cipro 3/3>>>3/6 azactam 3/2>>>3/3 vanc 3/2>>>3/5 Flagyl 3/3>>>3/5  SUBJECTIVE:BILI down to 7 !!!, went outside  VITAL SIGNS: Temp:  [97 F (36.1 C)-97.6 F (36.4 C)] 97.6 F (36.4 C) (03/12 0745) Pulse Rate:  [75-96] 88 (03/12 0700) Resp:  [9-25] 17 (03/12 0700) BP: (91-113)/(51-70) 108/70 mmHg (03/12 0700) SpO2:  [99 %-100 %] 100 % (03/12 0700)   INTAKE / OUTPUT: Intake/Output     03/11 0701 - 03/12 0700 03/12 0701 - 03/13 0700   I.V. (mL/kg) 2085.8 (25.3)    Blood 224    NG/GT     IV Piggyback     Total Intake(mL/kg) 2309.8 (28.1)    Urine (mL/kg/hr) 1000 (0.5)    Emesis/NG output 250 (0.1)    Stool 100 (0.1)    Total Output 1350     Net +959.8            PHYSICAL EXAMINATION: General Icteric better Neuro: follows commands well, no changes, equal power HEENT: Sclericterus, jvd unchanged Cardiovascular: RRR  s1 s2 Lungs: CTA, good air entry Abdomen: soft, NT, BS wnl, closed abdo, dry Musculoskeletal: no edema daily , muscles weak  Skin:  No rash  LABS: BMET    Component Value Date/Time   NA 145 04/04/2012 0500   K 3.6 04/04/2012 0500   CL 118* 04/04/2012 0500   CO2 18* 04/04/2012 0500   GLUCOSE 91 04/04/2012 0500   BUN 69* 04/04/2012 0500   CREATININE 1.52* 04/04/2012 0500   CALCIUM 7.8* 04/04/2012 0500    GFRNONAA 50* 04/04/2012 0500   GFRAA 58* 04/04/2012 0500    Hepatic Function Panel     Component Value Date/Time   PROT 3.8* 04/04/2012 0500   ALBUMIN 1.8* 04/04/2012 0500   AST 59* 04/04/2012 0500   ALT 110* 04/04/2012 0500   ALKPHOS 594* 04/04/2012 0500   BILITOT 7.2* 04/04/2012 0500   BILIDIR 14.0* 03/31/2012 0500   IBILI 1.3* 03/31/2012 0500    CBC    Component Value Date/Time   WBC 4.4 04/03/2012 1600   RBC 2.36* 04/03/2012 1600   HGB 7.2* 04/03/2012 1600   HCT 22.0* 04/03/2012 1600   PLT 97* 04/03/2012 1600   MCV 93.2 04/03/2012 1600   MCH 30.5 04/03/2012 1600   MCHC 32.7 04/03/2012 1600   RDW 22.4* 04/03/2012 1600   LYMPHSABS 0.3* 04/02/2012 1158   MONOABS 0.3 04/02/2012 1158   EOSABS 0.1 04/02/2012 1158   BASOSABS 0.0 04/02/2012 1158      Recent Labs Lab 04/03/12 1152 04/03/12 1453 04/03/12 2044 04/03/12 2355 04/04/12 0342  GLUCAP 105* 87 77 80 87    US Abdomen Complete  04/03/2012  *RADIOLOGY REPORT*  Clinical Data:  Jaundice, 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  No  gallstones are seen and there is no pain over the gallbladder with compression.  Common bile duct:  The common bile duct is within normal limits measuring 5.6 mm in diameter.  Liver:  The liver has a normal echogenic pattern.  No focal abnormality is seen and no ductal dilatation is noted.  A small amount of ascites surrounds the liver, and there are bilateral pleural effusions present.  IVC:  Appears normal.  Pancreas:  The pancreas appears somewhat inhomogeneous, partially obscured by bowel gas.  No focal abnormality is seen.  Spleen:  The spleen measures 5.0 cm sagittally.  However there is a complex fluid collection surrounding the spleen of 14.0 x 6.0 x 7.6 cm.  In review of the prior CT this could represent loculated perisplenic ascites,  but a subcapsular splenic hematoma is a definite consideration.  Right Kidney:  No hydronephrosis is seen.  The right kidney measures 12.5 cm sagittally and is echogenic consistent with chronic renal medical disease.  Correlation with renal laboratory values is recommended.  Left Kidney:  No hydronephrosis is noted.  The left kidney measures 12.5 cm and also is echogenic.  Abdominal aorta:  The abdominal aorta is normal in caliber although obscured distally by bowel gas.  IMPRESSION:  1.  Perisplenic complex fluid collection.  Possible loculated perisplenic ascites, but subcapsular splenic hematoma cannot be excluded. 2.  Echogenic kidneys suggesting chronic renal medical disease.  No hydronephrosis. 3.  No gallstones. 4.  Perihepatic ascites with bilateral pleural effusions noted. 5.  No intrahepatic ductal dilatation is seen.   Original Report Authenticated By: Dwyane Dee, M.D.      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 Sedation for vent 3/3 Pain control improved -PT -fent to q2h prn, would like to move this to q4h or q6h -Lactulose off, innate fxn wnl per MS, coags etc, avoid with gi bleed -went outside, repeat when able -PT, OT , splints needed    Severe muscle deconditioning   Shoulder subluxation, right - Cont PT/OT dopper neg, xray neg Leg pain -OT planned  -PT on going -may take him outside again if cbc wnl  GI Wound evisceration resolved   Hyperbilirubinemia, due to cholestasis - improved best during stay 3/12  Dysphagia due to oropharyngeal weakness Liver bx noted, cholestasis unclear etiology Wound closed 3/9 Gi bleed, r;o upper  -TF held for bleeding, would like to restart -repeat SLP after GI bleed resolves -bili follow up in 3-4 days, improved today to 7 -zofran limit when able Cbc to q12h -ppi  drip started, would change to q12h in am -plat Tx, no further needed    Thrombocytopenia, unspecified, HIT panel negative    Coagulopathy - likely due to hepatic dysfunction  Anemia, hemoconcentration well No DIC, -inr wnl = innate liver fxn wnl -scd -trying to limit phlebotomy when able  Pulm    Pleural effusion, L > R Remaining effusion but no distress  -IS encouraged -in chair, went outside, repeat   Renal   ARF (acute renal failure), Cr leveling off -?now CKD   Hypovolemia 3/9, ATN 3/10 from GI bleed hypoK -Continue d5w, consider addition bicarb for renal insuff -follow chem in am   Endocrine    Relative adrenal insufficy Continued stress, on stress steroids, await egd, limit to q12h then to qday today  ID Resolved cdiff with  MODS Bowel evisceration NO new source IDentified, likely was sirs with evisc -follow temp curve Can go to 2600  Resolved Problems:    history of UC (ulcerative colitis)   Atrial fibrillation, resolved   Acute respiratory failure, resolved   Septic shock, resolved   RUE DVT related to PICC, resolved   Hypokalemia, resolved   Hyperglycemia, resolved   Acute encephalopathy, resolved  Will update wife  Mcarthur Rossetti. Tyson Alias, MD, FACP Pgr: [phone] [facility]

## 2012-04-04 NOTE — Progress Notes (Signed)
3 Days Post-Op  Subjective: Pt with no acute issues overnight.  GI bleed seemed to have stopped.  On Protonix Con't with bowel function  Objective: Vital signs in last 24 hours: Temp:  [97 F (36.1 C)-97.6 F (36.4 C)] 97.6 F (36.4 C) (03/12 0745) Pulse Rate:  [75-96] 88 (03/12 0700) Resp:  [9-25] 17 (03/12 0700) BP: (91-113)/(51-70) 108/70 mmHg (03/12 0700) SpO2:  [99 %-100 %] 100 % (03/12 0700) Last BM Date: 04/03/12  Intake/Output from previous day: 03/11 0701 - 03/12 0700 In: 2309.8 [I.V.:2085.8; Blood:224] Out: 1350 [Urine:1000; Emesis/NG output:250; Stool:100] Intake/Output this shift:    General appearance: alert and cooperative GI: s/nt/nd active bs, ostomy patent Incision/Wound: midline wound c/d/i  Lab Results:   Recent Labs  04/03/12 0833 04/03/12 1600  WBC 4.9 4.4  HGB 7.4* 7.2*  HCT 22.3* 22.0*  PLT 99* 97*   BMET  Recent Labs  04/03/12 0400 04/04/12 0500  NA 145 145  K 4.3 3.6  CL 119* 118*  CO2 17* 18*  GLUCOSE 122* 91  BUN 78* 69*  CREATININE 1.53* 1.52*  CALCIUM 8.0* 7.8*   PT/INR  Recent Labs  04/02/12 2049  LABPROT 15.9*  INR 1.30   ABG No results found for this basename: PHART, PCO2, PO2, HCO3,  in the last 72 hours  Studies/Results: US Abdomen Complete  04/03/2012  *RADIOLOGY REPORT*  Clinical Data:  Jaundice, 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  No gallstones are seen and there is no pain over the gallbladder with compression.  Common bile duct:  The common bile duct is within normal limits measuring 5.6 mm in diameter.  Liver:  The liver has a normal echogenic pattern.  No focal abnormality is seen and no ductal dilatation is noted.  A small amount of ascites surrounds the liver, and there are bilateral pleural effusions present.  IVC:  Appears  normal.  Pancreas:  The pancreas appears somewhat inhomogeneous, partially obscured by bowel gas.  No focal abnormality is seen.  Spleen:  The spleen measures 5.0 cm sagittally.  However there is a complex fluid collection surrounding the spleen of 14.0 x 6.0 x 7.6 cm.  In review of the prior CT this could represent loculated perisplenic ascites, but a subcapsular splenic hematoma is a definite consideration.  Right Kidney:  No hydronephrosis is seen.  The right kidney measures 12.5 cm sagittally and is echogenic consistent with chronic renal medical disease.  Correlation with renal laboratory values is recommended.  Left Kidney:  No hydronephrosis is noted.  The left kidney measures 12.5 cm and also is echogenic.  Abdominal aorta:  The abdominal aorta is normal in caliber although obscured distally by bowel gas.  IMPRESSION:  1.  Perisplenic complex fluid collection.  Possible loculated perisplenic ascites, but subcapsular splenic hematoma cannot be excluded. 2.  Echogenic kidneys suggesting chronic renal medical disease.  No hydronephrosis. 3.  No gallstones. 4.  Perihepatic ascites with bilateral pleural effusions noted. 5.  No intrahepatic ductal dilatation is seen.   Original Report Authenticated By: Ivar Drape, M.D.     Anti-infectives: Anti-infectives   Start     Dose/Rate Route Frequency Ordered Stop   04/01/12 1030  ciprofloxacin (CIPRO) IVPB 400 mg     400 mg 200 mL/hr over 60 Minutes Intravenous To Surgery 04/01/12 1028 04/01/12 1058  03/26/12 2100  vancomycin (VANCOCIN) IVPB 1000 mg/200 mL premix  Status:  Discontinued     1,000 mg 200 mL/hr over 60 Minutes Intravenous Every 24 hours 03/25/12 1934 03/28/12 0913   03/26/12 0945  ciprofloxacin (CIPRO) IVPB 400 mg  Status:  Discontinued     400 mg 200 mL/hr over 60 Minutes Intravenous Every 12 hours 03/26/12 0933 03/29/12 1331   03/26/12 0600  aztreonam (AZACTAM) 1 g in dextrose 5 % 50 mL IVPB  Status:  Discontinued     1 g 100 mL/hr over  30 Minutes Intravenous 3 times per day 03/25/12 1934 03/26/12 0929   03/25/12 2200  metroNIDAZOLE (FLAGYL) IVPB 500 mg  Status:  Discontinued     500 mg 100 mL/hr over 60 Minutes Intravenous Every 8 hours 03/25/12 1909 03/28/12 0913   03/25/12 2000  vancomycin (VANCOCIN) IVPB 1000 mg/200 mL premix     1,000 mg 200 mL/hr over 60 Minutes Intravenous STAT 03/25/12 1933 03/25/12 2054   03/25/12 2000  aztreonam (AZACTAM) 1 g in dextrose 5 % 50 mL IVPB     1 g 100 mL/hr over 30 Minutes Intravenous STAT 03/25/12 1933 03/25/12 2024   03/20/12 1145  ciprofloxacin (CIPRO) IVPB 400 mg    Comments:  ON CALL TO OR   400 mg 200 mL/hr over 60 Minutes Intravenous  Once 03/20/12 1143 03/20/12 1722   03/18/12 1000  vancomycin (VANCOCIN) 1,500 mg in sodium chloride 0.9 % 500 mL IVPB  Status:  Discontinued     1,500 mg 250 mL/hr over 120 Minutes Intravenous Every 48 hours 03/17/12 1920 03/19/12 0840   03/14/12 1000  micafungin (MYCAMINE) 100 mg in sodium chloride 0.9 % 100 mL IVPB  Status:  Discontinued     100 mg 100 mL/hr over 1 Hours Intravenous Daily 03/13/12 0632 03/19/12 0840   03/13/12 1000  micafungin (MYCAMINE) 100 mg in sodium chloride 0.9 % 100 mL IVPB     100 mg 100 mL/hr over 1 Hours Intravenous Daily 03/13/12 0632 03/13/12 1311   03/12/12 1800  vancomycin (VANCOCIN) IVPB 1000 mg/200 mL premix  Status:  Discontinued     1,000 mg 200 mL/hr over 60 Minutes Intravenous Every 24 hours 03/12/12 0859 03/17/12 1017   03/12/12 1000  imipenem-cilastatin (PRIMAXIN) 500 mg in sodium chloride 0.9 % 100 mL IVPB  Status:  Discontinued     500 mg 200 mL/hr over 30 Minutes Intravenous 3 times per day 03/12/12 0848 03/12/12 0909   03/12/12 1000  imipenem-cilastatin (PRIMAXIN) 250 mg in sodium chloride 0.9 % 100 mL IVPB  Status:  Discontinued     250 mg 200 mL/hr over 30 Minutes Intravenous 4 times per day 03/12/12 0909 03/19/12 0841   03/11/12 1200  vancomycin (VANCOCIN) 750 mg in sodium chloride 0.9 % 150  mL IVPB  Status:  Discontinued     750 mg 150 mL/hr over 60 Minutes Intravenous Every 12 hours 03/11/12 0204 03/12/12 0858   03/11/12 0215  vancomycin (VANCOCIN) 750 mg in sodium chloride 0.9 % 150 mL IVPB     750 mg 150 mL/hr over 60 Minutes Intravenous  Once 03/11/12 0204 03/11/12 0352   03/11/12 0215  aztreonam (AZACTAM) 1 g in dextrose 5 % 50 mL IVPB  Status:  Discontinued     1 g 100 mL/hr over 30 Minutes Intravenous 3 times per day 03/11/12 0204 03/12/12 0851   03/05/12 2200  metroNIDAZOLE (FLAGYL) IVPB 500 mg  Status:  Discontinued    Comments:  First dose ASAP   500 mg 100 mL/hr over 60 Minutes Intravenous Every 8 hours 03/05/12 1346 03/19/12 1509   03/02/12 1200  vancomycin (VANCOCIN) 50 mg/mL oral solution 500 mg  Status:  Discontinued     500 mg Oral 4 times per day 03/02/12 0913 03/19/12 1509   03/01/12 1445  vancomycin (VANCOCIN) 50 mg/mL oral solution 500 mg  Status:  Discontinued     500 mg Oral 3 times per day 03/01/12 1340 03/02/12 0913   03/01/12 1400  aztreonam (AZACTAM) 1 g in dextrose 5 % 50 mL IVPB  Status:  Discontinued     1 g 100 mL/hr over 30 Minutes Intravenous 3 times per day 03/01/12 1356 03/02/12 1146   03/01/12 1300  vancomycin (VANCOCIN) 1,250 mg in sodium chloride 0.9 % 250 mL IVPB  Status:  Discontinued     1,250 mg 166.7 mL/hr over 90 Minutes Intravenous Every 12 hours 03/01/12 1127 03/02/12 1146   02/29/12 0600  ciprofloxacin (CIPRO) IVPB 400 mg     400 mg 200 mL/hr over 60 Minutes Intravenous On call to O.R. 02/28/12 1409 02/28/12 1438   02/28/12 2200  vancomycin (VANCOCIN) 500 mg in sodium chloride irrigation 0.9 % 60 mL ENEMA  Status:  Discontinued     500 mg Rectal 2 times daily 02/28/12 1504 02/28/12 1817   02/28/12 1200  vancomycin (VANCOCIN) 50 mg/mL oral solution 500 mg  Status:  Discontinued    Comments:  First dose ASAP   500 mg Per Tube 4 times per day 02/28/12 0814 02/28/12 1817   02/28/12 1000  vancomycin (VANCOCIN) IVPB 1000 mg/200 mL  premix  Status:  Discontinued     1,000 mg 200 mL/hr over 60 Minutes Intravenous Every 12 hours 02/28/12 0845 03/01/12 1127   02/28/12 1000  vancomycin (VANCOCIN) 500 mg in sodium chloride irrigation 0.9 % 100 mL ENEMA  Status:  Discontinued     500 mg Rectal 2 times daily 02/28/12 0923 02/28/12 1504   02/27/12 1600  metroNIDAZOLE (FLAGYL) IVPB 500 mg  Status:  Discontinued     500 mg 100 mL/hr over 60 Minutes Intravenous Every 8 hours 02/27/12 1546 02/27/12 2000   02/27/12 1600  ciprofloxacin (CIPRO) IVPB 400 mg  Status:  Discontinued     400 mg 200 mL/hr over 60 Minutes Intravenous Every 12 hours 02/27/12 1547 02/27/12 2000   02/27/12 1545  metroNIDAZOLE (FLAGYL) IVPB 500 mg  Status:  Discontinued     500 mg 100 mL/hr over 60 Minutes Intravenous  Once 02/27/12 1535 02/28/12 0843   02/27/12 1545  vancomycin (VANCOCIN) 500 mg in sodium chloride 0.9 % 100 mL IVPB  Status:  Discontinued     500 mg 100 mL/hr over 60 Minutes Intravenous  Once 02/27/12 1535 02/27/12 1538   02/27/12 1545  ciprofloxacin (CIPRO) IVPB 400 mg  Status:  Discontinued     400 mg 200 mL/hr over 60 Minutes Intravenous  Once 02/27/12 1538 02/28/12 0843   02/27/12 1415  vancomycin (VANCOCIN) 500 mg in sodium chloride 0.9 % 100 mL IVPB     500 mg 100 mL/hr over 60 Minutes Intravenous  Once 02/27/12 1402 02/27/12 1411   02/27/12 1400  vancomycin (VANCOCIN) powder 500 mg  Status:  Discontinued     500 mg Other To Surgery 02/27/12 1359 02/27/12 1402   02/27/12 1200  metroNIDAZOLE (FLAGYL) IVPB 500 mg  Status:  Discontinued    Comments:  First dose ASAP   500 mg 100 mL/hr  over 60 Minutes Intravenous Every 6 hours 02/27/12 0953 03/05/12 1346   02/27/12 1200  vancomycin (VANCOCIN) 50 mg/mL oral solution 250 mg  Status:  Discontinued    Comments:  First dose ASAP   250 mg Oral 4 times per day 02/27/12 0953 02/28/12 0814   02/26/12 1700  oseltamivir (TAMIFLU) capsule 75 mg     75 mg Oral  Once 02/26/12 1651 02/26/12 1715       Assessment/Plan: s/p Procedure(s): removal of abdominal vac dressing and abdominal closure (N/A) con't BID dressign changes Can con't TF when OK w/ GI Fluid collection seen on [place] likely just ascities, was seen in the OR  LOS: 38 days    Rosario Jacks., Anne Hahn 04/04/2012

## 2012-04-04 NOTE — Progress Notes (Signed)
[facility] PROGRESS NOTE Subjective patient more alert. He had a smidgen of bright blood and snows that NG aspirate continues to be clear bile.  Objective: Vital signs in last 24 hours: Temp:  [97 F (36.1 C)-97.8 F (36.6 C)] 97.8 F (36.6 C) (03/12 1159) Pulse Rate:  [75-93] 93 (03/12 1200) Resp:  [9-20] 16 (03/12 1200) BP: (92-114)/(51-70) 114/68 mmHg (03/12 1200) SpO2:  [99 %-100 %] 99 % (03/12 1200) Last BM Date: 04/03/12  Intake/Output from previous day: 03/11 0701 - 03/12 0700 In: 2309.8 [I.V.:2085.8; Blood:224] Out: 1350 [Urine:1000; Emesis/NG output:250; Stool:100] Intake/Output this shift: Total I/O In: 10 [I.V.:10] Out: 275 [Urine:275]  PE: Gen. -- alert and oriented and responsive Abdomen -- none distended and soft. dark green fluid in the ostomy  Lab Results:  Recent Labs  04/02/12 2355 04/03/12 0033 04/03/12 0617 04/03/12 0833 04/03/12 1600  WBC 4.6 5.2 4.7 4.9 4.4  HGB 6.8* 7.1* 8.0* 7.4* 7.2*  HCT 21.1* 21.4* 24.3* 22.3* 22.0*  PLT 56* 91* 50* 99* 97*   BMET  Recent Labs  04/02/12 0424 04/03/12 0400 04/04/12 0500  NA 142 145 145  K 3.8 4.3 3.6  CL 115* 119* 118*  CO2 18* 17* 18*  CREATININE 1.37* 1.53* 1.52*   LFT  Recent Labs  04/04/12 0500  PROT 3.8*  AST 59*  ALT 110*  ALKPHOS 594*  BILITOT 7.2*   PT/INR  Recent Labs  04/02/12 2049  LABPROT 15.9*  INR 1.30   PANCREAS  Recent Labs  04/02/12 0424  LIPASE 34         Studies/Results: US Abdomen Complete  04/03/2012  *RADIOLOGY REPORT*  Clinical Data:  Jaundice, 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  No gallstones are seen and there is no pain over the gallbladder with compression.  Common bile duct:  The common bile duct is within normal limits measuring 5.6 mm in diameter.   Liver:  The liver has a normal echogenic pattern.  No focal abnormality is seen and no ductal dilatation is noted.  A small amount of ascites surrounds the liver, and there are bilateral pleural effusions present.  IVC:  Appears normal.  Pancreas:  The pancreas appears somewhat inhomogeneous, partially obscured by bowel gas.  No focal abnormality is seen.  Spleen:  The spleen measures 5.0 cm sagittally.  However there is a complex fluid collection surrounding the spleen of 14.0 x 6.0 x 7.6 cm.  In review of the prior CT this could represent loculated perisplenic ascites, but a subcapsular splenic hematoma is a definite consideration.  Right Kidney:  No hydronephrosis is seen.  The right kidney measures 12.5 cm sagittally and is echogenic consistent with chronic renal medical disease.  Correlation with renal laboratory values is recommended.  Left Kidney:  No hydronephrosis is noted.  The left kidney measures 12.5 cm and also is echogenic.  Abdominal aorta:  The abdominal aorta is normal in caliber although obscured distally by bowel gas.  IMPRESSION:  1.  Perisplenic complex fluid collection.  Possible loculated perisplenic ascites, but subcapsular splenic hematoma cannot be excluded. 2.  Echogenic kidneys suggesting chronic renal medical disease.  No hydronephrosis. 3.  No gallstones. 4.  Perihepatic ascites with bilateral pleural effusions noted. 5.  No intrahepatic ductal dilatation is seen.   Original Report Authenticated By: Ivar Drape, M.D.  Medications: I have reviewed the patient's current medications.  Assessment/Plan: 1. Jaundice. Ultrasound shows no gallstones or clear dilated ducts. Continues to show fluid around the spleen possible societies versus hematoma. Bilirubin markedly improved today. Suspect this is all due to his continued overall improvement would recommend repeating liver tests 3 to 4 days. 2. G.I. bleeding. Appears to have stopped. At this point I would continue on PPI therapy and  could probably just give IV Q. 12. Go ahead and restart tube feeding. If he begins to have active bleeding I would pursue EGD at that time.    Malayia Spizzirri JR,Johnae Friley L 04/04/2012, 3:24 PM

## 2012-04-05 LAB — CBC
HCT: 22.8 % — ABNORMAL LOW (ref 39.0–52.0)
MCH: 31.4 pg (ref 26.0–34.0)
MCHC: 32.5 g/dL (ref 30.0–36.0)
MCV: 96.6 fL (ref 78.0–100.0)
RDW: 22.5 % — ABNORMAL HIGH (ref 11.5–15.5)

## 2012-04-05 LAB — BASIC METABOLIC PANEL
BUN: 57 mg/dL — ABNORMAL HIGH (ref 6–23)
CO2: 21 mEq/L (ref 19–32)
CO2: 21 mEq/L (ref 19–32)
Calcium: 7.5 mg/dL — ABNORMAL LOW (ref 8.4–10.5)
Chloride: 116 mEq/L — ABNORMAL HIGH (ref 96–112)
Creatinine, Ser: 1.43 mg/dL — ABNORMAL HIGH (ref 0.50–1.35)
GFR calc Af Amer: 64 mL/min — ABNORMAL LOW (ref >90)
GFR calc non Af Amer: 54 mL/min — ABNORMAL LOW (ref >90)
Glucose, Bld: 109 mg/dL — ABNORMAL HIGH (ref 70–99)
Glucose, Bld: 130 mg/dL — ABNORMAL HIGH (ref 70–99)
Potassium: 4.1 mEq/L (ref 3.5–5.1)

## 2012-04-05 LAB — GLUCOSE, CAPILLARY
Glucose-Capillary: 114 mg/dL — ABNORMAL HIGH (ref 70–99)
Glucose-Capillary: 132 mg/dL — ABNORMAL HIGH (ref 70–99)

## 2012-04-05 MED ORDER — POTASSIUM CHLORIDE 20 MEQ/15ML (10%) PO LIQD
ORAL | Status: AC
  Administered 2012-04-05: 40 meq
  Filled 2012-04-05: qty 30

## 2012-04-05 MED ORDER — POTASSIUM CHLORIDE 20 MEQ/15ML (10%) PO LIQD
40.0000 meq | ORAL | Status: AC
  Administered 2012-04-05: 40 meq
  Filled 2012-04-05 (×3): qty 30

## 2012-04-05 MED ORDER — PANTOPRAZOLE SODIUM 40 MG IV SOLR
40.0000 mg | Freq: Two times a day (BID) | INTRAVENOUS | Status: DC
  Administered 2012-04-05 – 2012-04-12 (×16): 40 mg via INTRAVENOUS
  Filled 2012-04-05 (×22): qty 40

## 2012-04-05 NOTE — Progress Notes (Signed)
4 Days Post-Op  Subjective: Pt with no acute issues overnight.  HCt stable this AM  Con't dressing changes  Objective: Vital signs in last 24 hours: Temp:  [97 F (36.1 C)-98 F (36.7 C)] 97 F (36.1 C) (03/13 0758) Pulse Rate:  [73-101] 93 (03/13 0700) Resp:  [9-19] 15 (03/13 0700) BP: (96-114)/(50-71) 107/54 mmHg (03/13 0600) SpO2:  [96 %-100 %] 99 % (03/13 0700) Weight:  [173 lb 15.1 oz (78.9 kg)] 173 lb 15.1 oz (78.9 kg) (03/13 0300) Last BM Date: 04/03/12  Intake/Output from previous day: 03/12 0701 - 03/13 0700 In: 2638.8 [I.V.:2188.8; NG/GT:450] Out: 1730 [Urine:1430; Emesis/NG output:150; Stool:150] Intake/Output this shift:    General appearance: alert and cooperative GI: wound c/d/i, ostomy patent  Lab Results:   Recent Labs  04/04/12 0033 04/04/12 1233  WBC 4.2 4.7  HGB 7.0* 7.2*  HCT 21.2* 21.6*  PLT 93* 97*   BMET  Recent Labs  04/04/12 0500 04/05/12 0439  NA 145 146*  K 3.6 2.9*  CL 118* 115*  CO2 18* 21  GLUCOSE 91 109*  BUN 69* 59*  CREATININE 1.52* 1.43*  CALCIUM 7.8* 7.5*   PT/INR  Recent Labs  04/02/12 2049  LABPROT 15.9*  INR 1.30   ABG No results found for this basename: PHART, PCO2, PO2, HCO3,  in the last 72 hours  Studies/Results: US Abdomen Complete  04/03/2012  *RADIOLOGY REPORT*  Clinical Data:  Jaundice, 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  No gallstones are seen and there is no pain over the gallbladder with compression.  Common bile duct:  The common bile duct is within normal limits measuring 5.6 mm in diameter.  Liver:  The liver has a normal echogenic pattern.  No focal abnormality is seen and no ductal dilatation is noted.  A small amount of ascites surrounds the liver, and there are bilateral pleural effusions present.  IVC:  Appears  normal.  Pancreas:  The pancreas appears somewhat inhomogeneous, partially obscured by bowel gas.  No focal abnormality is seen.  Spleen:  The spleen measures 5.0 cm sagittally.  However there is a complex fluid collection surrounding the spleen of 14.0 x 6.0 x 7.6 cm.  In review of the prior CT this could represent loculated perisplenic ascites, but a subcapsular splenic hematoma is a definite consideration.  Right Kidney:  No hydronephrosis is seen.  The right kidney measures 12.5 cm sagittally and is echogenic consistent with chronic renal medical disease.  Correlation with renal laboratory values is recommended.  Left Kidney:  No hydronephrosis is noted.  The left kidney measures 12.5 cm and also is echogenic.  Abdominal aorta:  The abdominal aorta is normal in caliber although obscured distally by bowel gas.  IMPRESSION:  1.  Perisplenic complex fluid collection.  Possible loculated perisplenic ascites, but subcapsular splenic hematoma cannot be excluded. 2.  Echogenic kidneys suggesting chronic renal medical disease.  No hydronephrosis. 3.  No gallstones. 4.  Perihepatic ascites with bilateral pleural effusions noted. 5.  No intrahepatic ductal dilatation is seen.   Original Report Authenticated By: Ivar Drape, M.D.     Anti-infectives: Anti-infectives   Start     Dose/Rate Route Frequency Ordered Stop   04/01/12 1030  ciprofloxacin (CIPRO) IVPB 400 mg     400 mg 200 mL/hr over 60 Minutes Intravenous To Surgery  04/01/12 1028 04/01/12 1058   03/26/12 2100  vancomycin (VANCOCIN) IVPB 1000 mg/200 mL premix  Status:  Discontinued     1,000 mg 200 mL/hr over 60 Minutes Intravenous Every 24 hours 03/25/12 1934 03/28/12 0913   03/26/12 0945  ciprofloxacin (CIPRO) IVPB 400 mg  Status:  Discontinued     400 mg 200 mL/hr over 60 Minutes Intravenous Every 12 hours 03/26/12 0933 03/29/12 1331   03/26/12 0600  aztreonam (AZACTAM) 1 g in dextrose 5 % 50 mL IVPB  Status:  Discontinued     1 g 100 mL/hr over  30 Minutes Intravenous 3 times per day 03/25/12 1934 03/26/12 0929   03/25/12 2200  metroNIDAZOLE (FLAGYL) IVPB 500 mg  Status:  Discontinued     500 mg 100 mL/hr over 60 Minutes Intravenous Every 8 hours 03/25/12 1909 03/28/12 0913   03/25/12 2000  vancomycin (VANCOCIN) IVPB 1000 mg/200 mL premix     1,000 mg 200 mL/hr over 60 Minutes Intravenous STAT 03/25/12 1933 03/25/12 2054   03/25/12 2000  aztreonam (AZACTAM) 1 g in dextrose 5 % 50 mL IVPB     1 g 100 mL/hr over 30 Minutes Intravenous STAT 03/25/12 1933 03/25/12 2024   03/20/12 1145  ciprofloxacin (CIPRO) IVPB 400 mg    Comments:  ON CALL TO OR   400 mg 200 mL/hr over 60 Minutes Intravenous  Once 03/20/12 1143 03/20/12 1722   03/18/12 1000  vancomycin (VANCOCIN) 1,500 mg in sodium chloride 0.9 % 500 mL IVPB  Status:  Discontinued     1,500 mg 250 mL/hr over 120 Minutes Intravenous Every 48 hours 03/17/12 1920 03/19/12 0840   03/14/12 1000  micafungin (MYCAMINE) 100 mg in sodium chloride 0.9 % 100 mL IVPB  Status:  Discontinued     100 mg 100 mL/hr over 1 Hours Intravenous Daily 03/13/12 0632 03/19/12 0840   03/13/12 1000  micafungin (MYCAMINE) 100 mg in sodium chloride 0.9 % 100 mL IVPB     100 mg 100 mL/hr over 1 Hours Intravenous Daily 03/13/12 0632 03/13/12 1311   03/12/12 1800  vancomycin (VANCOCIN) IVPB 1000 mg/200 mL premix  Status:  Discontinued     1,000 mg 200 mL/hr over 60 Minutes Intravenous Every 24 hours 03/12/12 0859 03/17/12 1017   03/12/12 1000  imipenem-cilastatin (PRIMAXIN) 500 mg in sodium chloride 0.9 % 100 mL IVPB  Status:  Discontinued     500 mg 200 mL/hr over 30 Minutes Intravenous 3 times per day 03/12/12 0848 03/12/12 0909   03/12/12 1000  imipenem-cilastatin (PRIMAXIN) 250 mg in sodium chloride 0.9 % 100 mL IVPB  Status:  Discontinued     250 mg 200 mL/hr over 30 Minutes Intravenous 4 times per day 03/12/12 0909 03/19/12 0841   03/11/12 1200  vancomycin (VANCOCIN) 750 mg in sodium chloride 0.9 % 150  mL IVPB  Status:  Discontinued     750 mg 150 mL/hr over 60 Minutes Intravenous Every 12 hours 03/11/12 0204 03/12/12 0858   03/11/12 0215  vancomycin (VANCOCIN) 750 mg in sodium chloride 0.9 % 150 mL IVPB     750 mg 150 mL/hr over 60 Minutes Intravenous  Once 03/11/12 0204 03/11/12 0352   03/11/12 0215  aztreonam (AZACTAM) 1 g in dextrose 5 % 50 mL IVPB  Status:  Discontinued     1 g 100 mL/hr over 30 Minutes Intravenous 3 times per day 03/11/12 0204 03/12/12 0851   03/05/12 2200  metroNIDAZOLE (FLAGYL) IVPB 500 mg  Status:  Discontinued    Comments:  First dose ASAP   500 mg 100 mL/hr over 60 Minutes Intravenous Every 8 hours 03/05/12 1346 03/19/12 1509   03/02/12 1200  vancomycin (VANCOCIN) 50 mg/mL oral solution 500 mg  Status:  Discontinued     500 mg Oral 4 times per day 03/02/12 0913 03/19/12 1509   03/01/12 1445  vancomycin (VANCOCIN) 50 mg/mL oral solution 500 mg  Status:  Discontinued     500 mg Oral 3 times per day 03/01/12 1340 03/02/12 0913   03/01/12 1400  aztreonam (AZACTAM) 1 g in dextrose 5 % 50 mL IVPB  Status:  Discontinued     1 g 100 mL/hr over 30 Minutes Intravenous 3 times per day 03/01/12 1356 03/02/12 1146   03/01/12 1300  vancomycin (VANCOCIN) 1,250 mg in sodium chloride 0.9 % 250 mL IVPB  Status:  Discontinued     1,250 mg 166.7 mL/hr over 90 Minutes Intravenous Every 12 hours 03/01/12 1127 03/02/12 1146   02/29/12 0600  ciprofloxacin (CIPRO) IVPB 400 mg     400 mg 200 mL/hr over 60 Minutes Intravenous On call to O.R. 02/28/12 1409 02/28/12 1438   02/28/12 2200  vancomycin (VANCOCIN) 500 mg in sodium chloride irrigation 0.9 % 60 mL ENEMA  Status:  Discontinued     500 mg Rectal 2 times daily 02/28/12 1504 02/28/12 1817   02/28/12 1200  vancomycin (VANCOCIN) 50 mg/mL oral solution 500 mg  Status:  Discontinued    Comments:  First dose ASAP   500 mg Per Tube 4 times per day 02/28/12 0814 02/28/12 1817   02/28/12 1000  vancomycin (VANCOCIN) IVPB 1000 mg/200 mL  premix  Status:  Discontinued     1,000 mg 200 mL/hr over 60 Minutes Intravenous Every 12 hours 02/28/12 0845 03/01/12 1127   02/28/12 1000  vancomycin (VANCOCIN) 500 mg in sodium chloride irrigation 0.9 % 100 mL ENEMA  Status:  Discontinued     500 mg Rectal 2 times daily 02/28/12 0923 02/28/12 1504   02/27/12 1600  metroNIDAZOLE (FLAGYL) IVPB 500 mg  Status:  Discontinued     500 mg 100 mL/hr over 60 Minutes Intravenous Every 8 hours 02/27/12 1546 02/27/12 2000   02/27/12 1600  ciprofloxacin (CIPRO) IVPB 400 mg  Status:  Discontinued     400 mg 200 mL/hr over 60 Minutes Intravenous Every 12 hours 02/27/12 1547 02/27/12 2000   02/27/12 1545  metroNIDAZOLE (FLAGYL) IVPB 500 mg  Status:  Discontinued     500 mg 100 mL/hr over 60 Minutes Intravenous  Once 02/27/12 1535 02/28/12 0843   02/27/12 1545  vancomycin (VANCOCIN) 500 mg in sodium chloride 0.9 % 100 mL IVPB  Status:  Discontinued     500 mg 100 mL/hr over 60 Minutes Intravenous  Once 02/27/12 1535 02/27/12 1538   02/27/12 1545  ciprofloxacin (CIPRO) IVPB 400 mg  Status:  Discontinued     400 mg 200 mL/hr over 60 Minutes Intravenous  Once 02/27/12 1538 02/28/12 0843   02/27/12 1415  vancomycin (VANCOCIN) 500 mg in sodium chloride 0.9 % 100 mL IVPB     500 mg 100 mL/hr over 60 Minutes Intravenous  Once 02/27/12 1402 02/27/12 1411   02/27/12 1400  vancomycin (VANCOCIN) powder 500 mg  Status:  Discontinued     500 mg Other To Surgery 02/27/12 1359 02/27/12 1402   02/27/12 1200  metroNIDAZOLE (FLAGYL) IVPB 500 mg  Status:  Discontinued    Comments:  First dose ASAP  500 mg 100 mL/hr over 60 Minutes Intravenous Every 6 hours 02/27/12 0953 03/05/12 1346   02/27/12 1200  vancomycin (VANCOCIN) 50 mg/mL oral solution 250 mg  Status:  Discontinued    Comments:  First dose ASAP   250 mg Oral 4 times per day 02/27/12 0953 02/28/12 0814   02/26/12 1700  oseltamivir (TAMIFLU) capsule 75 mg     75 mg Oral  Once 02/26/12 1651 02/26/12 1715       Assessment/Plan: s/p Procedure(s): removal of abdominal vac dressing and abdominal closure (N/A) Cont TPN Con't BID dressing changes  LOS: 39 days    Rosario Jacks., Anne Hahn 04/05/2012

## 2012-04-05 NOTE — Progress Notes (Signed)
Occupational Therapy Treatment Patient Details Name: Connor Small MRN: [id number] DOB: [birth year] Today's Date: 04/05/2012 Time: 1010-1055 OT Time Calculation (min): 45 min  OT Assessment / Plan / Recommendation Comments on Treatment Session Pt continues with B UE weakness; R >L. 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 Received wrist cock up splint from ortho tech today. will modify to compensate for appaernt radial n deficits. Will continue to assess.    Follow Up Recommendations  CIR    Barriers to Discharge       Equipment Recommendations  3 in 1 bedside comode;Tub/shower bench    Recommendations for Other Services Rehab consult  Frequency Min 3X/week   Plan Discharge plan remains appropriate    Precautions / Restrictions Precautions Precautions: Fall Precaution Comments: abdominal wound - placed binder on per MD order Restrictions Weight Bearing Restrictions: No   Pertinent Vitals/Pain no apparent distress     ADL  Eating/Feeding: NPO ADL Comments: focus of session on BUE strengthening. Pt placed in L sidelying to participate in PNF scapular strengthening.. Supine PNF shoulder strengthening. Supine - R elbow wrist and hand A/AAROM. L UE A/AAROM. decreased L index finger flexion noted.     OT Diagnosis:    OT Problem List:   OT Treatment Interventions:     OT Goals Acute Rehab OT Goals OT Goal Formulation: Patient unable to participate in goal setting Time For Goal Achievement: 04/16/12 Potential to Achieve Goals: Good ADL Goals Pt Will Perform Grooming: with mod assist;Sitting, chair;Supported;with cueing (comment type and amount) ADL Goal: Grooming - Progress: Progressing toward goals Arm Goals Pt Will Complete Theraputty Exer: to increase strength;Bilateral upper extremities;with minimal assist;Other (comment) Arm Goal: Theraputty Exercises  - Progress: Progressing toward goal Additional Arm Goal #1: Wife will complete P/AAROM BUE with S Arm Goal: Additional Goal #1 - Progress: Progressing toward goals Additional Arm Goal #2: complete PNF D1/D2 patterns with LUE in preparation for ADL. Arm Goal: Additional Goal #2 - Progress: Progressing toward goals Miscellaneous OT Goals Miscellaneous OT Goal #1: Wife will complete edema control techniques with S R hand OT Goal: Miscellaneous Goal #1 - Progress: Progressing toward goals Miscellaneous OT Goal #2: Demonstrate functional grasp/release R hand with use of orthotic with min A  OT Goal: Miscellaneous Goal #2 - Progress: Goal set today  Visit Information  Last OT Received On: 04/05/12 Assistance Needed: +2    Subjective Data      Prior Functioning       Cognition  Cognition Overall Cognitive Status: Impaired    Mobility  Bed Mobility Details for Bed Mobility Assistance: Total assist    Exercises  General Exercises - Upper Extremity Shoulder Flexion: Both;10 reps;Supine;AAROM;Sidelying Shoulder ABduction: Both;10 reps;Supine;Strengthening;AAROM Elbow Flexion: Both;10 reps;Strengthening;Supine;AAROM Elbow Extension: Strengthening;Both;10 reps;Supine;AAROM Wrist Flexion: Strengthening;Both;10 reps;Supine Wrist Extension: Both;10 reps;Supine Digit Composite Flexion: Both;10 reps;Strengthening;Supine Composite Extension: Both;Supine Other Exercises Other Exercises: Scapular strngthening all planes Other Exercises: theraputty exercises - soft/tan   Balance     End of Session OT - End of Session Activity Tolerance: Patient limited by fatigue Patient left: in bed;with family/visitor present Nurse Communication: Mobility status  GO     WARD,HILLARY 04/05/2012, 11:32 AM Maurie Boettcher, OTR/L  [phone] 04/05/2012

## 2012-04-05 NOTE — Progress Notes (Signed)
[facility] PROGRESS NOTE Subjective Pt tolerating TF. No gross bleeding.  Objective: Vital signs in last 24 hours: Temp:  [97 F (36.1 C)-98 F (36.7 C)] 97.7 F (36.5 C) (03/13 1509) Pulse Rate:  [77-111] 96 (03/13 1500) Resp:  [10-22] 13 (03/13 1500) BP: (98-115)/(50-71) 115/64 mmHg (03/13 1400) SpO2:  [96 %-100 %] 100 % (03/13 1500) Weight:  [78.9 kg (173 lb 15.1 oz)] 78.9 kg (173 lb 15.1 oz) (03/13 0300) Last BM Date: 04/03/12  Intake/Output from previous day: 03/12 0701 - 03/13 0700 In: 3056.8 [I.V.:2606.8; NG/GT:450] Out: 1730 [Urine:1430; Emesis/NG output:150; Stool:150] Intake/Output this shift: Total I/O In: 1385 [I.V.:935; NG/GT:450] Out: 440 [Urine:240; Stool:200]  PE: Gen--looks much better Abd--brown stool in bag, abd nontender  Lab Results:  Recent Labs  04/03/12 0833 04/03/12 1600 04/04/12 0033 04/04/12 1233 04/05/12 1153  WBC 4.9 4.4 4.2 4.7 5.0  HGB 7.4* 7.2* 7.0* 7.2* 7.4*  HCT 22.3* 22.0* 21.2* 21.6* 22.8*  PLT 99* 97* 93* 97* 96*   BMET  Recent Labs  04/03/12 0400 04/04/12 0500 04/05/12 0439  NA 145 145 146*  K 4.3 3.6 2.9*  CL 119* 118* 115*  CO2 17* 18* 21  CREATININE 1.53* 1.52* 1.43*   LFT  Recent Labs  04/04/12 0500  PROT 3.8*  AST 59*  ALT 110*  ALKPHOS 594*  BILITOT 7.2*   PT/INR  Recent Labs  04/02/12 2049  LABPROT 15.9*  INR 1.30   PANCREAS No results found for this basename: LIPASE,  in the last 72 hours       Studies/Results: US Abdomen Complete  04/03/2012  *RADIOLOGY REPORT*  Clinical Data:  Jaundice, 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  No gallstones are seen and there is no pain over the gallbladder with compression.  Common bile duct:  The common bile duct is within normal limits measuring 5.6 mm in  diameter.  Liver:  The liver has a normal echogenic pattern.  No focal abnormality is seen and no ductal dilatation is noted.  A small amount of ascites surrounds the liver, and there are bilateral pleural effusions present.  IVC:  Appears normal.  Pancreas:  The pancreas appears somewhat inhomogeneous, partially obscured by bowel gas.  No focal abnormality is seen.  Spleen:  The spleen measures 5.0 cm sagittally.  However there is a complex fluid collection surrounding the spleen of 14.0 x 6.0 x 7.6 cm.  In review of the prior CT this could represent loculated perisplenic ascites, but a subcapsular splenic hematoma is a definite consideration.  Right Kidney:  No hydronephrosis is seen.  The right kidney measures 12.5 cm sagittally and is echogenic consistent with chronic renal medical disease.  Correlation with renal laboratory values is recommended.  Left Kidney:  No hydronephrosis is noted.  The left kidney measures 12.5 cm and also is echogenic.  Abdominal aorta:  The abdominal aorta is normal in caliber although obscured distally by bowel gas.  IMPRESSION:  1.  Perisplenic complex fluid collection.  Possible loculated perisplenic ascites, but subcapsular splenic hematoma cannot be excluded. 2.  Echogenic kidneys suggesting chronic renal medical disease.  No hydronephrosis. 3.  No gallstones. 4.  Perihepatic ascites with bilateral pleural effusions noted. 5.  No intrahepatic ductal dilatation is seen.   Original Report Authenticated By: Ivar Drape, M.D.     Medications:  I have reviewed the patient's current medications.  Assessment/Plan: 1. Jaundice. No new LFTs but have been improving. Continue to follow every few days. 2. GI Bleed. Appears to have resolved with protonix, would continue, ? Change to PO or via tube in future  We will follow q 2-3 days, please call if needed before   EDWARDS JR,JAMES L 04/05/2012, 4:21 PM

## 2012-04-05 NOTE — Progress Notes (Signed)
Orthopedic Tech Progress Note Patient Details:  Connor Small 1960-05-07 [id number]  Ortho Devices Type of Ortho Device: Wrist splint Ortho Device/Splint Location: RIGHT WRIST SPLINT Ortho Device/Splint Interventions: Ordered   Irish Elders 04/05/2012, 11:17 AM

## 2012-04-05 NOTE — Progress Notes (Signed)
PULMONARY  / CRITICAL CARE MEDICINE  Name: Connor Small MRN: [id number] DOB: [birth year]    ADMISSION DATE:  02/26/2012 CONSULTATION DATE:  2/5  REFERRING MD :  Karilyn Cota  CHIEF COMPLAINT:  Acute resp failure   BRIEF PATIENT DESCRIPTION:  44 YOM admitted to [facility] for Cdiff on 2/2. Underwent decompressive cecostomy 2/3, initially was better, then worsened on 2/4 with increased abdominal distension and tenderness, decreased renal function.  He was taken back to the OR on 2/4 for total abdominal colectomy and end ileostomy.  Post op he developed ARDS and septic shock and was being treated for this.  The morning of 2/6 he had new onset of A-fib and transferred to [facility].   Course complicated by severe hypoalbuminemia, elevated LFTs  -cholestatic without biliary obstruction & pre-renal failure. Hypotensive 3/2 am requiring albumin  SIGNIFICANT EVENTS / STUDIES:  2/2 Admit to [facility] with C diff, toxic megacolon 2/3 Decompressive Cecostomy 2/4 Total abdominal colectomy and end ileostomy 2/6 New A-fib and transfer to [facility] health. 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 2/10 CT abd/pelvis: small hematoma likely subcapsular spleen, JPs wnl 2/11 trial TFs  2/11 UE venous dopplers: Findings consistent with superficial vein thrombosis involving the right basilic vein 2/14 LE venous dopplers: No evidence of deep vein thrombosis 2/14 LUE venous doppler: No evidence of deep vein or superficial thrombosis involving the left upper extremity  2/15 neg 4 l bal on lasix drip, lasix d/c  2/16 poor neuro status, CT head: [facility] 2/17 CRRT initiated, high pressor needs 2/17 RUQ [place]: Gallbladder wall thickening in the absence of sludge or stones but no biliary ductal dilatation 2/18 reduced vasopressor reqts 2/19 New onset thrombocytopenia. Heparin D/C'd. Argatroban  initiated 2/19 [place] abd (repeat): No explanation for elevated liver function tests 2/20 reduction pressors, improved alertness, extubated 2/20 LE venous dopplers: No evidence of deep vein or superficial thrombosis 2/21 bleeding overnight from JP's, argatroban turned off, lower pressor needs 2/22 off pressors, on RA, converted to NSR 2/22 Carotid dopplers: No significant extracranial carotid artery stenosis demonstrated 2/25 Cystoscopy with bilateral retrograde pyelograms. No extravasation of contrast from either ureteral orifice. No evidence of hydroureteronephrosis. Unremarkable urinary bladder. B ureteral stents placed 2/27 MRI brain: Scattered small acute to subacute infarcts in both cerebral hemispheres, brainstem, and right cerebellum. Favor sequelae of emboli from a cardiac or proximal aortic source. Main alternate consideration is hypotensive episode 2/27 MRCP: No intrahepatic bile duct dilatation or common bile duct dilatation. No gallstones identified. Large subcapsular fluid collection encases the spleen. This is of indeterminate etiology. This may represent a subacute to chronic subcapsular hematoma or abscess. 2/28 SLP eval: FEES performed. Oropharyngeal weakness noted. Recommend replacement of NGT to supplement PO intake 3/1 Renal signed off 3/3- bowel evisceration, to OR 3/3- no perf / abscess, wound vac open could not close, Liver BX, some oozing, post op resp failure 3/3 echo repeat - improved ef55%, poor windows 3/4- low dose pressor needs 3/4- extubated, high output again from JP's 3/6- OR trip, partial closure wound 3/7- renal fxn continues to improve 3/8- doppler legs >>>Prelim NEG 3/9- hypotension, volume given 3/9- closed, retention service 3/10- GI bleed, required Transfusion, hemodynamics improved, ppi drip 3/12- bili down  LINES / TUBES: ETT ([facility]) 2/4>>> 2/20 PICC ([facility]) 2/4>>>2/11 Art Line ([facility]) 2/5>>>out L IJ CVL 2/06 >> out A line R fem 2/18>>>2/24 Rt  ij HD  2/17 >> out Left IJ 2/18 >>  ETT 3/3>>>out post op x 2  CULTURES: BCx2 2/4>>>negative BCx2 2/3>>>negative UC 2/3- Negative MRSA PCR 2/3- Negative C diff 2/6>>>Positive Body fluid 2/9>>>WBCs, no organisms 2/15 BC x 2 >> NEG 2/15 Sputum >>>NF 3/2 bc>>>NGTD 3/2 urine>>>neg 3/2 sputum>>>neg 3/5 cdiff>>>neg  PATH: 3/3 liver bx>>>BENIGN LIVER WITH CHOLESTASIS, The biopsy findings are that of extrahepatic biliary obstruction. No noted primary sclerosing cholangitis.  ANTIBIOTICS: Flagyl 2/3>> d/c'd Vanc 2/6 (IV) >>>2/7 azactam 2/6>>>2/7 Oral vanc 2/6>>>off IV vanc 2/16 >2/24 Aztreonam 2/16 >2/17 Imipenem 2/17>>>2/24 mycofungin 2/18>>>2/24  cipro 3/3>>>3/6 azactam 3/2>>>3/3 vanc 3/2>>>3/5 Flagyl 3/3>>>3/5  SUBJECTIVE: Continues to have R shoulder, abd and sacral pain Fentanyl patch placed 3/12, receives boluses OOB to chair 3/12  VITAL SIGNS: Temp:  [97 F (36.1 C)-98 F (36.7 C)] 97.5 F (36.4 C) (03/13 1150) Pulse Rate:  [77-111] 105 (03/13 1200) Resp:  [10-22] 19 (03/13 1200) BP: (98-115)/(50-71) 115/67 mmHg (03/13 1200) SpO2:  [96 %-100 %] 97 % (03/13 1200) Weight:  [78.9 kg (173 lb 15.1 oz)] 78.9 kg (173 lb 15.1 oz) (03/13 0300)  INTAKE / OUTPUT: Intake/Output     03/12 0701 - 03/13 0700 03/13 0701 - 03/14 0700   I.V. (mL/kg) 2188.8 (27.7) 500 (6.3)   Blood     NG/GT 450 290   Total Intake(mL/kg) 2638.8 (33.4) 790 (10)   Urine (mL/kg/hr) 1430 (0.8) 240 (0.4)   Emesis/NG output 150 (0.1)    Stool 150 (0.1) 200 (0.3)   Total Output 1730 440   Net +908.8 +350          PHYSICAL EXAMINATION: General Icteric better Neuro: follows commands well, no changes, equal power HEENT: Sclericterus, jvd unchanged Cardiovascular: RRR  s1 s2 Lungs: CTA, good air entry Abdomen: soft, NT, BS wnl, closed abdo, dry Musculoskeletal: no edema daily , muscles weak  Skin:  No rash  LABS: BMET    Component Value Date/Time   NA 146* 04/05/2012 0439   K 2.9* 04/05/2012  0439   CL 115* 04/05/2012 0439   CO2 21 04/05/2012 0439   GLUCOSE 109* 04/05/2012 0439   BUN 59* 04/05/2012 0439   CREATININE 1.43* 04/05/2012 0439   CALCIUM 7.5* 04/05/2012 0439   GFRNONAA 54* 04/05/2012 0439   GFRAA 63* 04/05/2012 0439    Hepatic Function Panel     Component Value Date/Time   PROT 3.8* 04/04/2012 0500   ALBUMIN 1.8* 04/04/2012 0500   AST 59* 04/04/2012 0500   ALT 110* 04/04/2012 0500   ALKPHOS 594* 04/04/2012 0500   BILITOT 7.2* 04/04/2012 0500   BILIDIR 14.0* 03/31/2012 0500   IBILI 1.3* 03/31/2012 0500    CBC    Component Value Date/Time   WBC 5.0 04/05/2012 1153   RBC 2.36* 04/05/2012 1153   HGB 7.4* 04/05/2012 1153   HCT 22.8* 04/05/2012 1153   PLT 96* 04/05/2012 1153   MCV 96.6 04/05/2012 1153   MCH 31.4 04/05/2012 1153   MCHC 32.5 04/05/2012 1153   RDW 22.5* 04/05/2012 1153   LYMPHSABS 0.3* 04/02/2012 1158   MONOABS 0.3 04/02/2012 1158   EOSABS 0.1 04/02/2012 1158   BASOSABS 0.0 04/02/2012 1158      Recent Labs Lab 04/04/12 1946 04/04/12 2353 04/05/12 0445 04/05/12 0757 04/05/12 1144  GLUCAP 81 100* 104* 97 114*    US Abdomen Complete  04/03/2012  *RADIOLOGY REPORT*  Clinical Data:  Jaundice, complex history of C diff sepsis and subsequent colectomy  COMPLETE ABDOMINAL  ULTRASOUND  Comparison:  KUB of 03/25/2012 and CT abdomen pelvis of 03/11/2012  Findings:  Gallbladder:  The gallbladder is unremarkable.  No gallstones are seen and there is no pain over the gallbladder with compression.  Common bile duct:  The common bile duct is within normal limits measuring 5.6 mm in diameter.  Liver:  The liver has a normal echogenic pattern.  No focal abnormality is seen and no ductal dilatation is noted.  A small amount of ascites surrounds the liver, and there are bilateral pleural effusions present.  IVC:  Appears normal.  Pancreas:  The pancreas appears somewhat inhomogeneous, partially obscured by bowel gas.  No focal abnormality is seen.  Spleen:  The spleen measures 5.0 cm  sagittally.  However there is a complex fluid collection surrounding the spleen of 14.0 x 6.0 x 7.6 cm.  In review of the prior CT this could represent loculated perisplenic ascites, but a subcapsular splenic hematoma is a definite consideration.  Right Kidney:  No hydronephrosis is seen.  The right kidney measures 12.5 cm sagittally and is echogenic consistent with chronic renal medical disease.  Correlation with renal laboratory values is recommended.  Left Kidney:  No hydronephrosis is noted.  The left kidney measures 12.5 cm and also is echogenic.  Abdominal aorta:  The abdominal aorta is normal in caliber although obscured distally by bowel gas.  IMPRESSION:  1.  Perisplenic complex fluid collection.  Possible loculated perisplenic ascites, but subcapsular splenic hematoma cannot be excluded. 2.  Echogenic kidneys suggesting chronic renal medical disease.  No hydronephrosis. 3.  No gallstones. 4.  Perihepatic ascites with bilateral pleural effusions noted. 5.  No intrahepatic ductal dilatation is seen.   Original Report Authenticated By: Dwyane Dee, M.D.      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 Continue speech therapy and TF's Liver bx noted, cholestasis unclear etiology Wound closed 3/9 -zofran limit when  able Cbc to q12h -ppi change to q12h 3/13 -plat Tx, no further needed    Thrombocytopenia, unspecified, HIT panel negative    Coagulopathy - likely due to hepatic dysfunction  Anemia, hemoconcentration well No DIC, -inr wnl = innate liver fxn wnl -scd -trying to limit phlebotomy when able  Pulm    Pleural effusion, L > R Remaining effusion but no distress -IS encouraged -in chair, went outside, repeat   Renal   ARF (acute renal failure), Cr leveling off -?now CKD   Hypovolemia 3/9, ATN 3/10 from GI bleed hypoK -d/c bicarb 3/13  -follow chem am  Endocrine    Relative adrenal insufficy Continued stress, on stress steroids, d/c on 3/13 and follow  ID Resolved cdiff with MODS Bowel evisceration NO new source Identified, likely was sirs with evisc -follow temp curve -Can go to 2600 3/13   Resolved  Problems:    history of UC (ulcerative colitis)   Atrial fibrillation, resolved   Acute respiratory failure, resolved   Septic shock, resolved   RUE DVT related to PICC, resolved   Hypokalemia, resolved   Hyperglycemia, resolved   Acute encephalopathy, resolved  Will update wife  Levy Pupa, MD, PhD 04/05/2012, 2:50 PM [facility] [phone] or if no answer [phone]

## 2012-04-05 NOTE — Procedures (Signed)
Objective Swallowing Evaluation: Fiberoptic Endoscopic Evaluation of Swallowing  Patient Details  Name: Connor Small MRN: [id number] Date of Birth: 09/24/1960  Today's Date: 04/05/2012 Time: 2841-3244 SLP Time Calculation (min): 25 min  Past Medical History:  Past Medical History  Diagnosis Date  . Chronic diarrhea   . Rectal bleed   . Hemorrhoids   . Ulcerative colitis     Distal UC over 8 yrs ago diagnosed  . Diverticulitis of large intestine with perforation 10/2011    done at [facility]   Past Surgical History:  Past Surgical History  Procedure Laterality Date  . Colonoscopy  9/03  . Sigmoidoscopy       06/13/2002  . Temporary ostomy November 06, 2010      for a colon perforation that was done in [facility] (Dr Ruben Im).    . Colonoscopy  02/16/2011    Procedure: COLONOSCOPY;  Surgeon: Malissa Hippo, MD;  Location: [facility] SUITE;  Service: Endoscopy;  Laterality: N/A;  100  . Flexible sigmoidoscopy  02/27/2012    Procedure: FLEXIBLE SIGMOIDOSCOPY;  Surgeon: Malissa Hippo, MD;  Location: [facility];  Service: Endoscopy;  Laterality: N/A;  with colonic decompression  . Laparotomy  02/27/2012    Procedure: EXPLORATORY LAPAROTOMY;  Surgeon: Fabio Bering, MD;  Location: [facility] ORS;  Service: General;  Laterality: N/A;  . Cecostomy  02/27/2012    Procedure: CECOSTOMY;  Surgeon: Fabio Bering, MD;  Location: [facility] ORS;  Service: General;  Laterality: N/A;  Cecostomy Tube Placement  . Colectomy  02/28/2012    Procedure: TOTAL COLECTOMY;  Surgeon: Fabio Bering, MD;  Location: [facility] ORS;  Service: General;  Laterality: N/A;  . Ileostomy  02/28/2012    Procedure: ILEOSTOMY;  Surgeon: Fabio Bering, MD;  Location: [facility] ORS;  Service: General;  Laterality: N/A;  . Cystoscopy w/ ureteral stent placement Bilateral 03/20/2012    Procedure: CYSTOSCOPY WITH RETROGRADE PYELOGRAM/URETERAL STENT PLACEMENT;  Surgeon: Sebastian Ache, MD;  Location: [facility] OR;  Service: Urology;  Laterality: Bilateral;  .  Laparotomy N/A 03/26/2012    Procedure: EXPLORATORY LAPAROTOMY;  Surgeon: Mariella Saa, MD;  Location: [facility] OR;  Service: General;  Laterality: N/A;  . Application of wound vac N/A 03/26/2012    Procedure: APPLICATION OF WOUND VAC;  Surgeon: Mariella Saa, MD;  Location: [facility] OR;  Service: General;  Laterality: N/A;  . Liver biopsy N/A 03/26/2012    Procedure: LIVER BIOPSY;  Surgeon: Mariella Saa, MD;  Location: [facility] OR;  Service: General;  Laterality: N/A;  . Laparotomy N/A 03/29/2012    Procedure: EXPLORATORY LAPAROTOMY, PARTIAL WOUND CLOSURE;  Surgeon: Mariella Saa, MD;  Location: [facility] OR;  Service: General;  Laterality: N/A;  . Vacuum assisted closure change N/A 03/29/2012    Procedure: Open ABDOMINAL VACUUM  CHANGE;  Surgeon: Mariella Saa, MD;  Location: [facility] OR;  Service: General;  Laterality: N/A;  . Vacuum assisted closure change N/A 04/01/2012    Procedure: removal of abdominal vac dressing and abdominal closure;  Surgeon: Mariella Saa, MD;  Location: [facility] OR;  Service: General;  Laterality: N/A;   HPI:  54 YOM admitted to [facility] for Cdiff on 2/2. Underwent decompressive cecostomy 2/3, initially was better, then worsened on 2/4 with increased abdominal distension and tenderness, decreased renal function.  He was taken back to the OR on 2/4 for total abdominal colectomy and end ileostomy.  Post op he developed ARDS and sepsis shock and was being treated  for this.  The morning of 2/6 he had new onset of A-fib and transferred to [facility].  Initial FEES showed severe, dry pharyngeal secretions. after 2-3 days of frequent oral care and ice chips, repeat FEES showed improved pharyngeal hygiene, DI/pudding stated with continue TF. Over the following week pt underent two more surgeries for abdominal wound with brief intubation x2 and then developed GI bleed. Today repeat FEES to determine if pt is able to consume POs again. He has been taking few ice chips with family.       Assessment / Plan / Recommendation Clinical Impression  Dysphagia Diagnosis: Severe pharyngeal phase dysphagia;Mild oral phase dysphagia Clinical impression: Compared with last FEES on 03/23/12, pts oropharyngeal strength has worsened. Pt demonstrates decreased pharyngeal contraction, laryngeal, elevation, epiglottic deflection and laryngeal closure. Pt was given very limited trials, one teaspoon of honey and one teaspoon of puree. With both, there was a mild oral dysphagia with weak posterior propulsion and oral residuals falling to the pharynx post swallow. epiglottic deflection is incopmlete and there is penetration/aspiration during the swallow. Residulas left after the swallow, due to weakness are also repeatedly penetrated. Cued throat clears and coughs are inffective to remove aspirate. Pt did exhibit one strong reflexive cough that forcefully expelled penetrate.    Unfortunately due to this weeks medical complications pt weakness has increased. He is no longer recommended to consume any PO other than ice chips following oral care. SLP plans to work closely with pt over the next week as long as he is medically stable to complete pharyngeal strengthening exercises. Hopeful for improvement.     Treatment Recommendation       Diet Recommendation NPO;Ice chips PRN after oral care   Medication Administration: Via alternative means    Other  Recommendations Oral Care Recommendations: Oral care QID Other Recommendations: Have oral suction available   Follow Up Recommendations  LTACH    Frequency and Duration min 4x/week  2 weeks   Pertinent Vitals/Pain NA    SLP Swallow Goals Goal #3: Pt will complete base of tongue/pharyngeal/laryngeal strengthening exercises x10 each with moderate verbal cues.    General HPI: 55 YOM admitted to [facility] for Cdiff on 2/2. Underwent decompressive cecostomy 2/3, initially was better, then worsened on 2/4 with increased abdominal distension and tenderness,  decreased renal function.  He was taken back to the OR on 2/4 for total abdominal colectomy and end ileostomy.  Post op he developed ARDS and sepsis shock and was being treated for this.  The morning of 2/6 he had new onset of A-fib and transferred to [facility].  Initial FEES showed severe, dry pharyngeal secretions. after 2-3 days of frequent oral care and ice chips, repeat FEES showed improved pharyngeal hygiene, DI/pudding stated with continue TF. Over the following week pt underent two more surgeries for abdominal wound with brief intubation x2 and then developed GI bleed. Today repeat FEES to determine if pt is able to consume POs again. He has been taking few ice chips with family.  Type of Study: Fiberoptic Endoscopic Evaluation of Swallowing Reason for Referral: Objectively evaluate swallowing function Previous Swallow Assessment: FEES 03/20/12, 2/28 Diet Prior to this Study: NPO (NG for feeding/suction) Temperature Spikes Noted: No Respiratory Status: Supplemental O2 delivered via (comment) History of Recent Intubation: Yes Length of Intubations (days): 16 days Date extubated: 03/16/12 Behavior/Cognition: Alert;Cooperative;Requires cueing Oral Cavity - Dentition: Adequate natural dentition Oral Motor / Sensory Function:  (generalized weakness) Self-Feeding Abilities: Total assist Patient Positioning: Upright  in bed Baseline Vocal Quality: Breathy;Low vocal intensity Volitional Cough: Weak Volitional Swallow: Able to elicit Anatomy: Within functional limits Pharyngeal Secretions: Normal    Reason for Referral Objectively evaluate swallowing function   Oral Phase Oral Preparation/Oral Phase Oral Phase: Impaired Oral - Honey Oral - Honey Teaspoon: Weak lingual manipulation;Reduced posterior propulsion;Delayed oral transit Oral - Thin Oral - Thin Teaspoon: Not tested Oral - Solids Oral - Puree: Weak lingual manipulation;Reduced posterior propulsion;Delayed oral transit    Pharyngeal Phase Pharyngeal Phase Pharyngeal Phase: Impaired Pharyngeal - Honey Pharyngeal - Honey Teaspoon: Reduced pharyngeal peristalsis;Reduced epiglottic inversion;Reduced anterior laryngeal mobility;Reduced laryngeal elevation;Reduced airway/laryngeal closure;Reduced tongue base retraction;Penetration/Aspiration during swallow;Penetration/Aspiration after swallow;Moderate aspiration;Pharyngeal residue - valleculae;Pharyngeal residue - pyriform sinuses;Lateral channel residue;Inter-arytenoid space residue Penetration/Aspiration details (honey teaspoon): Material enters airway, passes BELOW cords without attempt by patient to eject out (silent aspiration) Pharyngeal - Nectar Pharyngeal - Nectar Teaspoon: Not tested Pharyngeal - Thin Pharyngeal - Ice Chips: Not tested Pharyngeal - Solids Pharyngeal - Puree: Reduced pharyngeal peristalsis;Reduced epiglottic inversion;Reduced anterior laryngeal mobility;Reduced laryngeal elevation;Reduced airway/laryngeal closure;Reduced tongue base retraction;Penetration/Aspiration during swallow;Penetration/Aspiration after swallow;Moderate aspiration;Pharyngeal residue - valleculae;Pharyngeal residue - pyriform sinuses;Lateral channel residue;Inter-arytenoid space residue  Cervical Esophageal Phase    GO             Harlon Ditty, MA CCC-SLP [phone]  Claudine Mouton 04/05/2012, 1:37 PM

## 2012-04-05 NOTE — Progress Notes (Signed)
[place] ICU Electrolyte Replacement Protocol  Patient Name: Connor Small DOB: 1960/08/13 MRN: [id number]  Date of Service  04/05/2012 K+ 2.9 Replaced per protocol  HPI/Events of Note    Recent Labs Lab 03/31/12 0500 04/01/12 0436 04/02/12 0424 04/03/12 0400 04/04/12 0500 04/05/12 0439  NA 143 143 142 145 145 146*  K 4.2 4.0 3.8 4.3 3.6 2.9*  CL 114* 113* 115* 119* 118* 115*  CO2 20 20 18* 17* 18* 21  GLUCOSE 102* 95 103* 122* 91 109*  BUN 70* 70* 69* 78* 69* 59*  CREATININE 1.55* 1.47* 1.37* 1.53* 1.52* 1.43*  CALCIUM 8.1* 7.7* 7.8* 8.0* 7.8* 7.5*  MG 2.1  --   --   --   --   --   PHOS 4.4  --   --   --   --   --     Estimated Creatinine Clearance: 59.1 ml/min (by C-G formula based on Cr of 1.43).  Intake/Output     03/12 0701 - 03/13 0700   I.V. (mL/kg) 1988.8 (25.2)   NG/GT 370   Total Intake(mL/kg) 2358.8 (29.9)   Urine (mL/kg/hr) 1280 (0.7)   Emesis/NG output 150 (0.1)   Total Output 1430   Net +928.8        - I/O DETAILED x24h    Total I/O In: 1310 [I.V.:1000; NG/GT:310] Out: 445 [Urine:445] - I/O THIS SHIFT    ASSESSMENT   eICURN Interventions     ASSESSMENT: MAJOR ELECTROLYTE    Lorene Dy 04/05/2012, 5:37 AM

## 2012-04-06 ENCOUNTER — Other Ambulatory Visit: Payer: Self-pay | Admitting: Gastroenterology

## 2012-04-06 DIAGNOSIS — Z9889 Other specified postprocedural states: Secondary | ICD-10-CM

## 2012-04-06 LAB — CBC
Hemoglobin: 7.2 g/dL — ABNORMAL LOW (ref 13.0–17.0)
MCH: 31.6 pg (ref 26.0–34.0)
MCV: 98.3 fL (ref 78.0–100.0)
Platelets: 105 10*3/uL — ABNORMAL LOW (ref 150–400)
RBC: 2.33 MIL/uL — ABNORMAL LOW (ref 4.22–5.81)
RBC: 2.34 MIL/uL — ABNORMAL LOW (ref 4.22–5.81)
RDW: 22.5 % — ABNORMAL HIGH (ref 11.5–15.5)

## 2012-04-06 LAB — TYPE AND SCREEN
ABO/RH(D): O NEG
Antibody Screen: NEGATIVE
Unit division: 0

## 2012-04-06 LAB — COMPREHENSIVE METABOLIC PANEL
ALT: 161 U/L — ABNORMAL HIGH (ref 0–53)
AST: 122 U/L — ABNORMAL HIGH (ref 0–37)
CO2: 21 mEq/L (ref 19–32)
Chloride: 117 mEq/L — ABNORMAL HIGH (ref 96–112)
GFR calc non Af Amer: 56 mL/min — ABNORMAL LOW (ref >90)
Potassium: 4.1 mEq/L (ref 3.5–5.1)
Sodium: 147 mEq/L — ABNORMAL HIGH (ref 135–145)
Total Bilirubin: 6.7 mg/dL — ABNORMAL HIGH (ref 0.3–1.2)

## 2012-04-06 LAB — EXPECTORATED SPUTUM ASSESSMENT W GRAM STAIN, RFLX TO RESP C

## 2012-04-06 LAB — GLUCOSE, CAPILLARY

## 2012-04-06 MED ORDER — ATROPINE SULFATE 1 MG/ML IJ SOLN
INTRAMUSCULAR | Status: AC
  Filled 2012-04-06: qty 1

## 2012-04-06 NOTE — Progress Notes (Signed)
Physical Therapy Treatment Patient Details Name: Connor Small MRN: [id number] DOB: 20-Nov-1960 Today's Date: 04/06/2012 Time: 4098-1191 PT Time Calculation (min): 38 min  PT Assessment / Plan / Recommendation Comments on Treatment Session  Pt with prolonged course with colectomy, VDRF, encephalopathy who continues with slow progression with mobility. Wife and dgtr present at initiation of session but stepped out and were unable to state if continuing to assist with HEP. Pt educated for need to perform HEP throughout the day with family assist as well as to assist with nursing moblilty. Pt did state during exercises today "why don't you all just shoot me" as pt frustrated with weakness. Encouraged pt to focus on the positive and keeping trying HEP. Will follow.     Follow Up Recommendations  LTACH     Does the patient have the potential to tolerate intense rehabilitation     Barriers to Discharge        Equipment Recommendations       Recommendations for Other Services    Frequency     Plan Frequency remains appropriate;Discharge plan needs to be updated    Precautions / Restrictions Precautions Precautions: Fall Precaution Comments: Abd binder, ostomy, panda Required Braces or Orthoses: Other Brace/Splint Other Brace/Splint: Abd binder Restrictions Weight Bearing Restrictions: No   Pertinent Vitals/Pain Pt reported pain in right knee with rolling, RN aware and repositioned    Mobility  Bed Mobility Rolling Left: 1: +1 Total assist Rolling Left: Patient Percentage: 10% Left Sidelying to Sit: 1: +2 Total assist Left Sidelying to Sit: Patient Percentage: 10% Sitting - Scoot to Edge of Bed: 1: +1 Total assist Sitting - Scoot to Edge of Bed: Patient Percentage: 0% Transfers Squat Pivot Transfers: 1: +2 Total assist Squat Pivot Transfers: Patient Percentage: 20% Details for Transfer Assistance: Bobath technique to pivot bed to chair with use of pad and belt  with bil LE  blocked for transfer Ambulation/Gait Ambulation/Gait Assistance: Not tested (comment)    Exercises General Exercises - Lower Extremity Ankle Circles/Pumps: AAROM;Both;10 reps;Supine Heel Slides: AAROM;Both;10 reps;Supine Hip ABduction/ADduction: AAROM;Both;10 reps;Supine   PT Diagnosis:    PT Problem List:   PT Treatment Interventions:     PT Goals Acute Rehab PT Goals Pt will go Supine/Side to Sit: with +1 total assist PT Goal: Supine/Side to Sit - Progress: Revised due to lack of progress Pt will Sit at [facility] of Bed: with max assist;1-2 min PT Goal: Sit at Edge Of Bed - Progress: Revised due to lack of progress Pt will go Sit to Stand: with +2 total assist (pt=30%) PT Goal: Sit to Stand - Progress: Goal set today Pt will Transfer Bed to Chair/Chair to Bed: with +1 total assist PT Transfer Goal: Bed to Chair/Chair to Bed - Progress: Revised due to lack of progress PT Goal: Perform Home Exercise Program - Progress: Progressing toward goal  Visit Information  Last PT Received On: 04/06/12 Assistance Needed: +2    Subjective Data  Subjective: "be easy on me"   Cognition  Cognition Overall Cognitive Status: Impaired Arousal/Alertness: Awake/alert Orientation Level: Disoriented to;Time;Place Behavior During Session: Flat affect Current Attention Level: Sustained Following Commands: Follows one step commands with increased time    Balance  Static Sitting Balance Static Sitting - Balance Support: Left upper extremity supported;Feet supported Static Sitting - Level of Assistance: 1: +1 Total assist Static Sitting - Comment/# of Minutes: Pt EOB 6 min with cueing for midline posture, pt maintains neck and trunk flexion throughout and able to  hold sitting balance max 5 sec on his own. Pt unable to extend neck without max assist and cueing to work on extension and posture in supported and unsupported sitting.  End of Session PT - End of Session Equipment Utilized During Treatment:  Gait belt;Other (comment) (Abd binder) Activity Tolerance: Patient limited by fatigue Patient left: in chair;with call bell/phone within reach Nurse Communication: Mobility status;Need for lift equipment   GP     Delorse Lek 04/06/2012, 10:26 AM Delaney Meigs, PT [phone]

## 2012-04-06 NOTE — Progress Notes (Signed)
Clinical Social Worker met with wife and pt at bedside.  CSW provided support.  Wife currently requesting pt's disposition to include CIR or Home with HH.  CSW and wife to follow up with RNCM on Monday to discuss hh.   Dala Dock, MSW, [place]

## 2012-04-06 NOTE — Progress Notes (Signed)
Patient ID: Connor Small, male   DOB: 28-Jul-1960, 52 y.o.   MRN: [id number] 5 Days Post-Op  Subjective: Pt asleep as well as family.  Objective: Vital signs in last 24 hours: Temp:  [97 F (36.1 C)-98.2 F (36.8 C)] 97.8 F (36.6 C) (03/14 0725) Pulse Rate:  [93-115] 104 (03/14 0725) Resp:  [10-24] 14 (03/14 0725) BP: (102-115)/(54-69) 109/64 mmHg (03/14 0725) SpO2:  [97 %-100 %] 99 % (03/14 0725) Last BM Date: 04/05/12  Intake/Output from previous day: 03/13 0701 - 03/14 0700 In: 2805.4 [I.V.:1265.4; NG/GT:1540] Out: 1415 [Urine:915; Stool:500] Intake/Output this shift:    PE: Abd: soft, wound is close and stable.  Mild slough at base of wound.  Retention sutures in place.  PANDA in place with TF at goal rate of 85cc  Lab Results:   Recent Labs  04/05/12 1153 04/06/12 0005  WBC 5.0 4.2  HGB 7.4* 7.2*  HCT 22.8* 22.5*  PLT 96* 96*   BMET  Recent Labs  04/05/12 2000 04/06/12 0453  NA 146* 147*  K 4.1 4.1  CL 116* 117*  CO2 21 21  GLUCOSE 130* 131*  BUN 57* 57*  CREATININE 1.41* 1.40*  CALCIUM 7.7* 7.8*   PT/INR No results found for this basename: LABPROT, INR,  in the last 72 hours CMP     Component Value Date/Time   NA 147* 04/06/2012 0453   K 4.1 04/06/2012 0453   CL 117* 04/06/2012 0453   CO2 21 04/06/2012 0453   GLUCOSE 131* 04/06/2012 0453   BUN 57* 04/06/2012 0453   CREATININE 1.40* 04/06/2012 0453   CALCIUM 7.8* 04/06/2012 0453   PROT 4.0* 04/06/2012 0453   ALBUMIN 1.7* 04/06/2012 0453   AST 122* 04/06/2012 0453   ALT 161* 04/06/2012 0453   ALKPHOS 941* 04/06/2012 0453   BILITOT 6.7* 04/06/2012 0453   GFRNONAA 56* 04/06/2012 0453   GFRAA 64* 04/06/2012 0453   Lipase     Component Value Date/Time   LIPASE 34 04/02/2012 0424       Studies/Results: No results found.  Anti-infectives: Anti-infectives   Start     Dose/Rate Route Frequency Ordered Stop   04/01/12 1030  ciprofloxacin (CIPRO) IVPB 400 mg     400 mg 200 mL/hr over 60 Minutes  Intravenous To Surgery 04/01/12 1028 04/01/12 1058   03/26/12 2100  vancomycin (VANCOCIN) IVPB 1000 mg/200 mL premix  Status:  Discontinued     1,000 mg 200 mL/hr over 60 Minutes Intravenous Every 24 hours 03/25/12 1934 03/28/12 0913   03/26/12 0945  ciprofloxacin (CIPRO) IVPB 400 mg  Status:  Discontinued     400 mg 200 mL/hr over 60 Minutes Intravenous Every 12 hours 03/26/12 0933 03/29/12 1331   03/26/12 0600  aztreonam (AZACTAM) 1 g in dextrose 5 % 50 mL IVPB  Status:  Discontinued     1 g 100 mL/hr over 30 Minutes Intravenous 3 times per day 03/25/12 1934 03/26/12 0929   03/25/12 2200  metroNIDAZOLE (FLAGYL) IVPB 500 mg  Status:  Discontinued     500 mg 100 mL/hr over 60 Minutes Intravenous Every 8 hours 03/25/12 1909 03/28/12 0913   03/25/12 2000  vancomycin (VANCOCIN) IVPB 1000 mg/200 mL premix     1,000 mg 200 mL/hr over 60 Minutes Intravenous STAT 03/25/12 1933 03/25/12 2054   03/25/12 2000  aztreonam (AZACTAM) 1 g in dextrose 5 % 50 mL IVPB     1 g 100 mL/hr over 30 Minutes Intravenous STAT 03/25/12 1933  03/25/12 2024   03/20/12 1145  ciprofloxacin (CIPRO) IVPB 400 mg    Comments:  ON CALL TO OR   400 mg 200 mL/hr over 60 Minutes Intravenous  Once 03/20/12 1143 03/20/12 1722   03/18/12 1000  vancomycin (VANCOCIN) 1,500 mg in sodium chloride 0.9 % 500 mL IVPB  Status:  Discontinued     1,500 mg 250 mL/hr over 120 Minutes Intravenous Every 48 hours 03/17/12 1920 03/19/12 0840   03/14/12 1000  micafungin (MYCAMINE) 100 mg in sodium chloride 0.9 % 100 mL IVPB  Status:  Discontinued     100 mg 100 mL/hr over 1 Hours Intravenous Daily 03/13/12 0632 03/19/12 0840   03/13/12 1000  micafungin (MYCAMINE) 100 mg in sodium chloride 0.9 % 100 mL IVPB     100 mg 100 mL/hr over 1 Hours Intravenous Daily 03/13/12 0632 03/13/12 1311   03/12/12 1800  vancomycin (VANCOCIN) IVPB 1000 mg/200 mL premix  Status:  Discontinued     1,000 mg 200 mL/hr over 60 Minutes Intravenous Every 24 hours  03/12/12 0859 03/17/12 1017   03/12/12 1000  imipenem-cilastatin (PRIMAXIN) 500 mg in sodium chloride 0.9 % 100 mL IVPB  Status:  Discontinued     500 mg 200 mL/hr over 30 Minutes Intravenous 3 times per day 03/12/12 0848 03/12/12 0909   03/12/12 1000  imipenem-cilastatin (PRIMAXIN) 250 mg in sodium chloride 0.9 % 100 mL IVPB  Status:  Discontinued     250 mg 200 mL/hr over 30 Minutes Intravenous 4 times per day 03/12/12 0909 03/19/12 0841   03/11/12 1200  vancomycin (VANCOCIN) 750 mg in sodium chloride 0.9 % 150 mL IVPB  Status:  Discontinued     750 mg 150 mL/hr over 60 Minutes Intravenous Every 12 hours 03/11/12 0204 03/12/12 0858   03/11/12 0215  vancomycin (VANCOCIN) 750 mg in sodium chloride 0.9 % 150 mL IVPB     750 mg 150 mL/hr over 60 Minutes Intravenous  Once 03/11/12 0204 03/11/12 0352   03/11/12 0215  aztreonam (AZACTAM) 1 g in dextrose 5 % 50 mL IVPB  Status:  Discontinued     1 g 100 mL/hr over 30 Minutes Intravenous 3 times per day 03/11/12 0204 03/12/12 0851   03/05/12 2200  metroNIDAZOLE (FLAGYL) IVPB 500 mg  Status:  Discontinued    Comments:  First dose ASAP   500 mg 100 mL/hr over 60 Minutes Intravenous Every 8 hours 03/05/12 1346 03/19/12 1509   03/02/12 1200  vancomycin (VANCOCIN) 50 mg/mL oral solution 500 mg  Status:  Discontinued     500 mg Oral 4 times per day 03/02/12 0913 03/19/12 1509   03/01/12 1445  vancomycin (VANCOCIN) 50 mg/mL oral solution 500 mg  Status:  Discontinued     500 mg Oral 3 times per day 03/01/12 1340 03/02/12 0913   03/01/12 1400  aztreonam (AZACTAM) 1 g in dextrose 5 % 50 mL IVPB  Status:  Discontinued     1 g 100 mL/hr over 30 Minutes Intravenous 3 times per day 03/01/12 1356 03/02/12 1146   03/01/12 1300  vancomycin (VANCOCIN) 1,250 mg in sodium chloride 0.9 % 250 mL IVPB  Status:  Discontinued     1,250 mg 166.7 mL/hr over 90 Minutes Intravenous Every 12 hours 03/01/12 1127 03/02/12 1146   02/29/12 0600  ciprofloxacin (CIPRO) IVPB 400  mg     400 mg 200 mL/hr over 60 Minutes Intravenous On call to O.R. 02/28/12 1409 02/28/12 1438   02/28/12 2200  vancomycin (VANCOCIN) 500 mg in sodium chloride irrigation 0.9 % 60 mL ENEMA  Status:  Discontinued     500 mg Rectal 2 times daily 02/28/12 1504 02/28/12 1817   02/28/12 1200  vancomycin (VANCOCIN) 50 mg/mL oral solution 500 mg  Status:  Discontinued    Comments:  First dose ASAP   500 mg Per Tube 4 times per day 02/28/12 0814 02/28/12 1817   02/28/12 1000  vancomycin (VANCOCIN) IVPB 1000 mg/200 mL premix  Status:  Discontinued     1,000 mg 200 mL/hr over 60 Minutes Intravenous Every 12 hours 02/28/12 0845 03/01/12 1127   02/28/12 1000  vancomycin (VANCOCIN) 500 mg in sodium chloride irrigation 0.9 % 100 mL ENEMA  Status:  Discontinued     500 mg Rectal 2 times daily 02/28/12 0923 02/28/12 1504   02/27/12 1600  metroNIDAZOLE (FLAGYL) IVPB 500 mg  Status:  Discontinued     500 mg 100 mL/hr over 60 Minutes Intravenous Every 8 hours 02/27/12 1546 02/27/12 2000   02/27/12 1600  ciprofloxacin (CIPRO) IVPB 400 mg  Status:  Discontinued     400 mg 200 mL/hr over 60 Minutes Intravenous Every 12 hours 02/27/12 1547 02/27/12 2000   02/27/12 1545  metroNIDAZOLE (FLAGYL) IVPB 500 mg  Status:  Discontinued     500 mg 100 mL/hr over 60 Minutes Intravenous  Once 02/27/12 1535 02/28/12 0843   02/27/12 1545  vancomycin (VANCOCIN) 500 mg in sodium chloride 0.9 % 100 mL IVPB  Status:  Discontinued     500 mg 100 mL/hr over 60 Minutes Intravenous  Once 02/27/12 1535 02/27/12 1538   02/27/12 1545  ciprofloxacin (CIPRO) IVPB 400 mg  Status:  Discontinued     400 mg 200 mL/hr over 60 Minutes Intravenous  Once 02/27/12 1538 02/28/12 0843   02/27/12 1415  vancomycin (VANCOCIN) 500 mg in sodium chloride 0.9 % 100 mL IVPB     500 mg 100 mL/hr over 60 Minutes Intravenous  Once 02/27/12 1402 02/27/12 1411   02/27/12 1400  vancomycin (VANCOCIN) powder 500 mg  Status:  Discontinued     500 mg Other To  Surgery 02/27/12 1359 02/27/12 1402   02/27/12 1200  metroNIDAZOLE (FLAGYL) IVPB 500 mg  Status:  Discontinued    Comments:  First dose ASAP   500 mg 100 mL/hr over 60 Minutes Intravenous Every 6 hours 02/27/12 0953 03/05/12 1346   02/27/12 1200  vancomycin (VANCOCIN) 50 mg/mL oral solution 250 mg  Status:  Discontinued    Comments:  First dose ASAP   250 mg Oral 4 times per day 02/27/12 0953 02/28/12 0814   02/26/12 1700  oseltamivir (TAMIFLU) capsule 75 mg     75 mg Oral  Once 02/26/12 1651 02/26/12 1715       Assessment/Plan  1. c diff colitis, s/p total abdominal colectomy with ileostomy 2. Wound evisceration, s/p open abdomen now closed 3. Multiple other medical problems 4. Dysphagia, on TF  Plan: 1. Wound looks good for now.  Cont current wound care 2. FEES from yesterday shows dysphagia still.  Cont TF.  When able to pass swallow study PANDA can be dc and he can start diet. 3. Cont to follow.  LOS: 40 days    Shanquita Ronning E 04/06/2012, 7:41 AM Pager: [phone]

## 2012-04-06 NOTE — Progress Notes (Signed)
PULMONARY  / CRITICAL CARE MEDICINE  Name: Connor Small MRN: [id number] DOB: October 15, 1960    ADMISSION DATE:  02/26/2012 CONSULTATION DATE:  2/5  REFERRING MD :  Karilyn Cota  CHIEF COMPLAINT:  Acute resp failure   BRIEF PATIENT DESCRIPTION:  50 YOM admitted to [facility] for Cdiff on 2/2. Underwent decompressive cecostomy 2/3, initially was better, then worsened on 2/4 with increased abdominal distension and tenderness, decreased renal function.  He was taken back to the OR on 2/4 for total abdominal colectomy and end ileostomy.  Post op he developed ARDS and septic shock and was being treated for this.  The morning of 2/6 he had new onset of A-fib and transferred to [facility].   Course complicated by severe hypoalbuminemia, elevated LFTs  -cholestatic without biliary obstruction & pre-renal failure. Hypotensive 3/2 am requiring albumin  SIGNIFICANT EVENTS / STUDIES:  2/2 Admit to [facility] with C diff, toxic megacolon 2/3 Decompressive Cecostomy 2/4 Total abdominal colectomy and end ileostomy 2/6 New A-fib and transfer to [facility] health. 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 2/10 CT abd/pelvis: small hematoma likely subcapsular spleen, JPs wnl 2/11 trial TFs  2/11 UE venous dopplers: Findings consistent with superficial vein thrombosis involving the right basilic vein 2/14 LE venous dopplers: No evidence of deep vein thrombosis 2/14 LUE venous doppler: No evidence of deep vein or superficial thrombosis involving the left upper extremity  2/15 neg 4 l bal on lasix drip, lasix d/c  2/16 poor neuro status, CT head: [facility] 2/17 CRRT initiated, high pressor needs 2/17 RUQ [place]: Gallbladder wall thickening in the absence of sludge or stones but no biliary ductal dilatation 2/18 reduced vasopressor reqts 2/19 New onset thrombocytopenia. Heparin D/C'd. Argatroban  initiated 2/19 [place] abd (repeat): No explanation for elevated liver function tests 2/20 reduction pressors, improved alertness, extubated 2/20 LE venous dopplers: No evidence of deep vein or superficial thrombosis 2/21 bleeding overnight from JP's, argatroban turned off, lower pressor needs 2/22 off pressors, on RA, converted to NSR 2/22 Carotid dopplers: No significant extracranial carotid artery stenosis demonstrated 2/25 Cystoscopy with bilateral retrograde pyelograms. No extravasation of contrast from either ureteral orifice. No evidence of hydroureteronephrosis. Unremarkable urinary bladder. B ureteral stents placed 2/27 MRI brain: Scattered small acute to subacute infarcts in both cerebral hemispheres, brainstem, and right cerebellum. Favor sequelae of emboli from a cardiac or proximal aortic source. Main alternate consideration is hypotensive episode 2/27 MRCP: No intrahepatic bile duct dilatation or common bile duct dilatation. No gallstones identified. Large subcapsular fluid collection encases the spleen. This is of indeterminate etiology. This may represent a subacute to chronic subcapsular hematoma or abscess. 2/28 SLP eval: FEES performed. Oropharyngeal weakness noted. Recommend replacement of NGT to supplement PO intake 3/1 Renal signed off 3/3- bowel evisceration, to OR 3/3- no perf / abscess, wound vac open could not close, Liver BX, some oozing, post op resp failure 3/3 echo repeat - improved ef55%, poor windows 3/4- low dose pressor needs 3/4- extubated, high output again from JP's 3/6- OR trip, partial closure wound 3/7- renal fxn continues to improve 3/8- doppler legs >>>Prelim NEG 3/9- hypotension, volume given 3/9- closed, retention service 3/10- GI bleed, required Transfusion, hemodynamics improved, ppi drip 3/12- bili down  LINES / TUBES: ETT ([facility]) 2/4>>> 2/20 PICC ([facility]) 2/4>>>2/11 Art Line ([facility]) 2/5>>>out L IJ CVL 2/06 >> out A line R fem 2/18>>>2/24 Rt  ij HD  2/17 >> out Left IJ 2/18 >>  ETT 3/3>>>out post op x 2  CULTURES: BCx2 2/4>>>negative BCx2 2/3>>>negative UC 2/3- Negative MRSA PCR 2/3- Negative C diff 2/6>>>Positive Body fluid 2/9>>>WBCs, no organisms 2/15 BC x 2 >> NEG 2/15 Sputum >>>NF 3/2 bc>>>NGTD 3/2 urine>>>neg 3/2 sputum>>>neg 3/5 cdiff>>>neg  PATH: 3/3 liver bx>>>BENIGN LIVER WITH CHOLESTASIS, The biopsy findings are that of extrahepatic biliary obstruction. No noted primary sclerosing cholangitis.  ANTIBIOTICS: Flagyl 2/3>> d/c'd Vanc 2/6 (IV) >>>2/7 azactam 2/6>>>2/7 Oral vanc 2/6>>>off IV vanc 2/16 >2/24 Aztreonam 2/16 >2/17 Imipenem 2/17>>>2/24 mycofungin 2/18>>>2/24  cipro 3/3>>>3/6 azactam 3/2>>>3/3 vanc 3/2>>>3/5 Flagyl 3/3>>>3/5  SUBJECTIVE: Worked w PT this am Still has not passed swallow eval, working w speech rx  VITAL SIGNS: Temp:  [97.4 F (36.3 C)-98.2 F (36.8 C)] 97.8 F (36.6 C) (03/14 1612) Pulse Rate:  [93-128] 128 (03/14 1612) Resp:  [10-24] 14 (03/14 1612) BP: (90-113)/(58-75) 90/75 mmHg (03/14 1612) SpO2:  [97 %-100 %] 97 % (03/14 1612)  INTAKE / OUTPUT: Intake/Output     03/13 0701 - 03/14 0700 03/14 0701 - 03/15 0700   I.V. (mL/kg) 1285.4 (16.3) 180 (2.3)   NG/GT 1625 680   Total Intake(mL/kg) 2910.4 (36.9) 860 (10.9)   Urine (mL/kg/hr) 915 (0.5) 200 (0.3)   Emesis/NG output     Stool 500 (0.3) 100 (0.1)   Total Output 1415 300   Net +1495.4 +560          PHYSICAL EXAMINATION: General Icteric better Neuro: follows commands well, no changes, equal power HEENT: Sclericterus, jvd unchanged Cardiovascular: RRR  s1 s2 Lungs: CTA, good air entry Abdomen: soft, NT, BS wnl, closed abdo, dry Musculoskeletal: no edema daily , muscles weak  Skin:  No rash  LABS: BMET    Component Value Date/Time   NA 147* 04/06/2012 0453   K 4.1 04/06/2012 0453   CL 117* 04/06/2012 0453   CO2 21 04/06/2012 0453   GLUCOSE 131* 04/06/2012 0453   BUN 57* 04/06/2012 0453   CREATININE  1.40* 04/06/2012 0453   CALCIUM 7.8* 04/06/2012 0453   GFRNONAA 56* 04/06/2012 0453   GFRAA 64* 04/06/2012 0453    Hepatic Function Panel     Component Value Date/Time   PROT 4.0* 04/06/2012 0453   ALBUMIN 1.7* 04/06/2012 0453   AST 122* 04/06/2012 0453   ALT 161* 04/06/2012 0453   ALKPHOS 941* 04/06/2012 0453   BILITOT 6.7* 04/06/2012 0453   BILIDIR 14.0* 03/31/2012 0500   IBILI 1.3* 03/31/2012 0500    CBC    Component Value Date/Time   WBC 4.4 04/06/2012 1215   RBC 2.34* 04/06/2012 1215   HGB 7.4* 04/06/2012 1215   HCT 23.0* 04/06/2012 1215   PLT 105* 04/06/2012 1215   MCV 98.3 04/06/2012 1215   MCH 31.6 04/06/2012 1215   MCHC 32.2 04/06/2012 1215   RDW 22.5* 04/06/2012 1215   LYMPHSABS 0.3* 04/02/2012 1158   MONOABS 0.3 04/02/2012 1158   EOSABS 0.1 04/02/2012 1158   BASOSABS 0.0 04/02/2012 1158      Recent Labs Lab 04/05/12 2032 04/06/12 0010 04/06/12 0448 04/06/12 0724 04/06/12 1118  GLUCAP 113* 116* 124* 117* 116*    No results found.   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 Continue speech therapy and TF's Liver bx noted, cholestasis unclear etiology -zofran limit when able -Cbc 3/15 and then qod if stable -ppi changed from gtt to q12h 3/13 -plat Tx, no further needed  Thrombocytopenia, unspecified,  HIT panel negative  Coagulopathy - likely due to hepatic dysfunction Anemia, No DIC, -inr wnl = innate liver fxn wnl -scd -trying to limit phlebotomy when able  Pulm    Pleural effusion, L > R Remaining effusion but no distress -IS encouraged -in chair, went outside 3/12, repeat as able   Renal   ARF (acute renal failure), Cr leveling off -?now CKD   Hypovolemia 3/9, ATN 3/10 from GI bleed hypoK -d/c'd bicarb 3/13  -follow chem   Endocrine    Relative adrenal insufficy Continued stress, on stress steroids, d/c'd on 3/13 and follow  ID Resolved cdiff with MODS Bowel evisceration NO new source Identified, likely was sirs with evisc -follow temp curve  Resolved Problems:    history of UC (ulcerative colitis)   Atrial fibrillation, resolved   Acute respiratory failure, resolved   Septic shock, resolved   RUE DVT related to PICC, resolved   Hypokalemia, resolved   Hyperglycemia, resolved   Acute encephalopathy, resolved  Will update wife 3/14  Levy Pupa, MD, PhD 04/06/2012, 4:22 PM [facility] [phone] or if no answer [phone]

## 2012-04-06 NOTE — Progress Notes (Signed)
I agree with PA-Osborne's finding and plan. Con't wound changes

## 2012-04-07 ENCOUNTER — Inpatient Hospital Stay (HOSPITAL_COMMUNITY): Payer: BC Managed Care – PPO

## 2012-04-07 LAB — CBC
MCH: 30.7 pg (ref 26.0–34.0)
MCHC: 32.3 g/dL (ref 30.0–36.0)
MCV: 98.1 fL (ref 78.0–100.0)
Platelets: 112 10*3/uL — ABNORMAL LOW (ref 150–400)
Platelets: 103 10*3/uL — ABNORMAL LOW (ref 150–400)
RBC: 2.14 MIL/uL — ABNORMAL LOW (ref 4.22–5.81)
RDW: 22.7 % — ABNORMAL HIGH (ref 11.5–15.5)
WBC: 4.1 10*3/uL (ref 4.0–10.5)

## 2012-04-07 LAB — GLUCOSE, CAPILLARY
Glucose-Capillary: 114 mg/dL — ABNORMAL HIGH (ref 70–99)
Glucose-Capillary: 130 mg/dL — ABNORMAL HIGH (ref 70–99)

## 2012-04-07 MED ORDER — FENTANYL 50 MCG/HR TD PT72
50.0000 ug | MEDICATED_PATCH | TRANSDERMAL | Status: DC
  Administered 2012-04-08: 50 ug via TRANSDERMAL
  Filled 2012-04-07: qty 1

## 2012-04-07 NOTE — Progress Notes (Signed)
1. The patient complaining of increased abdominal tenderness and distention; this am he had a slight drop in Hg to 6.6 and received 1 U of PRBC; unfortunately the CBC post transfusion has not been done;   We will obtain a cbc stat; previous abdominal US and CT abdomen with a perisplenic fluid collection concerning for possible hematoma; currently no evidence of GI bleed;   We will have a very los threshold to obtain a repeat CT abdomen.   2. Micro reviewed; GPC in sputum; afebrile; no leukocytosis; currently not on antibiotic coverage;  Obtain CXR; with evidence of infection we will start antibiotics

## 2012-04-07 NOTE — Progress Notes (Signed)
6 Days Post-Op  Subjective: Pt w/ no acute issues overnight.  Getting PRBCs for low hbg No signs of GIB  Objective: Vital signs in last 24 hours: Temp:  [97.6 F (36.4 C)-98.1 F (36.7 C)] 98.1 F (36.7 C) (03/15 0800) Pulse Rate:  [106-128] 115 (03/15 0800) Resp:  [14-23] 16 (03/15 0800) BP: (90-109)/(60-75) 108/65 mmHg (03/15 0800) SpO2:  [97 %-99 %] 99 % (03/15 0800) Last BM Date: 04/06/12  Intake/Output from previous day: 03/14 0701 - 03/15 0700 In: 2095 [I.V.:480; NG/GT:1615] Out: 800 [Urine:450; Stool:350] Intake/Output this shift: Total I/O In: 85 [NG/GT:85] Out: 350 [Urine:350]  General appearance: alert and cooperative GI: soft, non-tender; bowel sounds normal; no masses,  no organomegaly, wound looks great   Lab Results:   Recent Labs  04/06/12 1215 04/07/12 0500  WBC 4.4 4.1  HGB 7.4* 6.6*  HCT 23.0* 21.0*  PLT 105* 112*   BMET  Recent Labs  04/05/12 2000 04/06/12 0453  NA 146* 147*  K 4.1 4.1  CL 116* 117*  CO2 21 21  GLUCOSE 130* 131*  BUN 57* 57*  CREATININE 1.41* 1.40*  CALCIUM 7.7* 7.8*   PT/INR No results found for this basename: LABPROT, INR,  in the last 72 hours ABG No results found for this basename: PHART, PCO2, PO2, HCO3,  in the last 72 hours  Studies/Results: No results found.  Anti-infectives: Anti-infectives   Start     Dose/Rate Route Frequency Ordered Stop   04/01/12 1030  ciprofloxacin (CIPRO) IVPB 400 mg     400 mg 200 mL/hr over 60 Minutes Intravenous To Surgery 04/01/12 1028 04/01/12 1058   03/26/12 2100  vancomycin (VANCOCIN) IVPB 1000 mg/200 mL premix  Status:  Discontinued     1,000 mg 200 mL/hr over 60 Minutes Intravenous Every 24 hours 03/25/12 1934 03/28/12 0913   03/26/12 0945  ciprofloxacin (CIPRO) IVPB 400 mg  Status:  Discontinued     400 mg 200 mL/hr over 60 Minutes Intravenous Every 12 hours 03/26/12 0933 03/29/12 1331   03/26/12 0600  aztreonam (AZACTAM) 1 g in dextrose 5 % 50 mL IVPB  Status:   Discontinued     1 g 100 mL/hr over 30 Minutes Intravenous 3 times per day 03/25/12 1934 03/26/12 0929   03/25/12 2200  metroNIDAZOLE (FLAGYL) IVPB 500 mg  Status:  Discontinued     500 mg 100 mL/hr over 60 Minutes Intravenous Every 8 hours 03/25/12 1909 03/28/12 0913   03/25/12 2000  vancomycin (VANCOCIN) IVPB 1000 mg/200 mL premix     1,000 mg 200 mL/hr over 60 Minutes Intravenous STAT 03/25/12 1933 03/25/12 2054   03/25/12 2000  aztreonam (AZACTAM) 1 g in dextrose 5 % 50 mL IVPB     1 g 100 mL/hr over 30 Minutes Intravenous STAT 03/25/12 1933 03/25/12 2024   03/20/12 1145  ciprofloxacin (CIPRO) IVPB 400 mg    Comments:  ON CALL TO OR   400 mg 200 mL/hr over 60 Minutes Intravenous  Once 03/20/12 1143 03/20/12 1722   03/18/12 1000  vancomycin (VANCOCIN) 1,500 mg in sodium chloride 0.9 % 500 mL IVPB  Status:  Discontinued     1,500 mg 250 mL/hr over 120 Minutes Intravenous Every 48 hours 03/17/12 1920 03/19/12 0840   03/14/12 1000  micafungin (MYCAMINE) 100 mg in sodium chloride 0.9 % 100 mL IVPB  Status:  Discontinued     100 mg 100 mL/hr over 1 Hours Intravenous Daily 03/13/12 0632 03/19/12 0840   03/13/12 1000  micafungin (MYCAMINE) 100 mg in sodium chloride 0.9 % 100 mL IVPB     100 mg 100 mL/hr over 1 Hours Intravenous Daily 03/13/12 0632 03/13/12 1311   03/12/12 1800  vancomycin (VANCOCIN) IVPB 1000 mg/200 mL premix  Status:  Discontinued     1,000 mg 200 mL/hr over 60 Minutes Intravenous Every 24 hours 03/12/12 0859 03/17/12 1017   03/12/12 1000  imipenem-cilastatin (PRIMAXIN) 500 mg in sodium chloride 0.9 % 100 mL IVPB  Status:  Discontinued     500 mg 200 mL/hr over 30 Minutes Intravenous 3 times per day 03/12/12 0848 03/12/12 0909   03/12/12 1000  imipenem-cilastatin (PRIMAXIN) 250 mg in sodium chloride 0.9 % 100 mL IVPB  Status:  Discontinued     250 mg 200 mL/hr over 30 Minutes Intravenous 4 times per day 03/12/12 0909 03/19/12 0841   03/11/12 1200  vancomycin (VANCOCIN)  750 mg in sodium chloride 0.9 % 150 mL IVPB  Status:  Discontinued     750 mg 150 mL/hr over 60 Minutes Intravenous Every 12 hours 03/11/12 0204 03/12/12 0858   03/11/12 0215  vancomycin (VANCOCIN) 750 mg in sodium chloride 0.9 % 150 mL IVPB     750 mg 150 mL/hr over 60 Minutes Intravenous  Once 03/11/12 0204 03/11/12 0352   03/11/12 0215  aztreonam (AZACTAM) 1 g in dextrose 5 % 50 mL IVPB  Status:  Discontinued     1 g 100 mL/hr over 30 Minutes Intravenous 3 times per day 03/11/12 0204 03/12/12 0851   03/05/12 2200  metroNIDAZOLE (FLAGYL) IVPB 500 mg  Status:  Discontinued    Comments:  First dose ASAP   500 mg 100 mL/hr over 60 Minutes Intravenous Every 8 hours 03/05/12 1346 03/19/12 1509   03/02/12 1200  vancomycin (VANCOCIN) 50 mg/mL oral solution 500 mg  Status:  Discontinued     500 mg Oral 4 times per day 03/02/12 0913 03/19/12 1509   03/01/12 1445  vancomycin (VANCOCIN) 50 mg/mL oral solution 500 mg  Status:  Discontinued     500 mg Oral 3 times per day 03/01/12 1340 03/02/12 0913   03/01/12 1400  aztreonam (AZACTAM) 1 g in dextrose 5 % 50 mL IVPB  Status:  Discontinued     1 g 100 mL/hr over 30 Minutes Intravenous 3 times per day 03/01/12 1356 03/02/12 1146   03/01/12 1300  vancomycin (VANCOCIN) 1,250 mg in sodium chloride 0.9 % 250 mL IVPB  Status:  Discontinued     1,250 mg 166.7 mL/hr over 90 Minutes Intravenous Every 12 hours 03/01/12 1127 03/02/12 1146   02/29/12 0600  ciprofloxacin (CIPRO) IVPB 400 mg     400 mg 200 mL/hr over 60 Minutes Intravenous On call to O.R. 02/28/12 1409 02/28/12 1438   02/28/12 2200  vancomycin (VANCOCIN) 500 mg in sodium chloride irrigation 0.9 % 60 mL ENEMA  Status:  Discontinued     500 mg Rectal 2 times daily 02/28/12 1504 02/28/12 1817   02/28/12 1200  vancomycin (VANCOCIN) 50 mg/mL oral solution 500 mg  Status:  Discontinued    Comments:  First dose ASAP   500 mg Per Tube 4 times per day 02/28/12 0814 02/28/12 1817   02/28/12 1000   vancomycin (VANCOCIN) IVPB 1000 mg/200 mL premix  Status:  Discontinued     1,000 mg 200 mL/hr over 60 Minutes Intravenous Every 12 hours 02/28/12 0845 03/01/12 1127   02/28/12 1000  vancomycin (VANCOCIN) 500 mg in sodium chloride irrigation 0.9 %  100 mL ENEMA  Status:  Discontinued     500 mg Rectal 2 times daily 02/28/12 0923 02/28/12 1504   02/27/12 1600  metroNIDAZOLE (FLAGYL) IVPB 500 mg  Status:  Discontinued     500 mg 100 mL/hr over 60 Minutes Intravenous Every 8 hours 02/27/12 1546 02/27/12 2000   02/27/12 1600  ciprofloxacin (CIPRO) IVPB 400 mg  Status:  Discontinued     400 mg 200 mL/hr over 60 Minutes Intravenous Every 12 hours 02/27/12 1547 02/27/12 2000   02/27/12 1545  metroNIDAZOLE (FLAGYL) IVPB 500 mg  Status:  Discontinued     500 mg 100 mL/hr over 60 Minutes Intravenous  Once 02/27/12 1535 02/28/12 0843   02/27/12 1545  vancomycin (VANCOCIN) 500 mg in sodium chloride 0.9 % 100 mL IVPB  Status:  Discontinued     500 mg 100 mL/hr over 60 Minutes Intravenous  Once 02/27/12 1535 02/27/12 1538   02/27/12 1545  ciprofloxacin (CIPRO) IVPB 400 mg  Status:  Discontinued     400 mg 200 mL/hr over 60 Minutes Intravenous  Once 02/27/12 1538 02/28/12 0843   02/27/12 1415  vancomycin (VANCOCIN) 500 mg in sodium chloride 0.9 % 100 mL IVPB     500 mg 100 mL/hr over 60 Minutes Intravenous  Once 02/27/12 1402 02/27/12 1411   02/27/12 1400  vancomycin (VANCOCIN) powder 500 mg  Status:  Discontinued     500 mg Other To Surgery 02/27/12 1359 02/27/12 1402   02/27/12 1200  metroNIDAZOLE (FLAGYL) IVPB 500 mg  Status:  Discontinued    Comments:  First dose ASAP   500 mg 100 mL/hr over 60 Minutes Intravenous Every 6 hours 02/27/12 0953 03/05/12 1346   02/27/12 1200  vancomycin (VANCOCIN) 50 mg/mL oral solution 250 mg  Status:  Discontinued    Comments:  First dose ASAP   250 mg Oral 4 times per day 02/27/12 0953 02/28/12 0814   02/26/12 1700  oseltamivir (TAMIFLU) capsule 75 mg     75 mg  Oral  Once 02/26/12 1651 02/26/12 1715      Assessment/Plan: s/p Procedure(s): removal of abdominal vac dressing and abdominal closure (N/A)  1. c diff colitis, s/p total abdominal colectomy with ileostomy 2. Wound evisceration, s/p open abdomen now closed 3. Multiple other medical problems 4. Dysphagia, on TF  Plan: 1. Wound looks great.  Cont current wound care 2. FEES from yesterday shows dysphagia still.  Cont TF.  When able to pass swallow study PANDA can be dc and he can start diet. 3. Cont to follow.   LOS: 41 days    Rosario Jacks., Anne Hahn 04/07/2012

## 2012-04-07 NOTE — Progress Notes (Signed)
Pt complains of increased pain today, feeling tight and at times on awakening stating he couldn't breathe. Pain med given every 2 hours with numerous repositions and distractions. Pt tolerated OOB in chair 4 hours with improved sputum production and urine flow. Will continue to monitor.

## 2012-04-07 NOTE — Progress Notes (Signed)
Speech Language Pathology Dysphagia Treatment Patient Details Name: Connor Small MRN: [id number] DOB: 1960-05-21 Today's Date: 04/07/2012 Time: 5997-7414 SLP Time Calculation (min): 25 min  Assessment / Plan / Recommendation Clinical Impression  Pt seen for pharyngeal strengthening exercises. Pt required min verbal cues to complete 5 different exercises, he was able to complete 5-10 reps each. Written intruction provided for pt and wife to continue efforts independently over the weekend. Will return on monday.     Diet Recommendation  Continue with Current Diet: NPO    SLP Plan Continue with current plan of care   Pertinent Vitals/Pain NA   Swallowing Goals  SLP Swallowing Goals Goal #3: Pt will complete base of tongue/pharyngeal/laryngeal strengthening exercises x10 each with moderate verbal cues.  Swallow Study Goal #3 - Progress: Progressing toward goal  General Temperature Spikes Noted: No Respiratory Status: Room air Behavior/Cognition: Alert;Cooperative;Pleasant mood Oral Cavity - Dentition: Adequate natural dentition Patient Positioning: Upright in chair  Oral Cavity - Oral Hygiene     Dysphagia Treatment Treatment focused on: Facilitation of pharyngeal phase Family/Caregiver Educated: wife Treatment Methods/Modalities: Effortful swallow;Masako Maneuver;Mendelsohn Maneuver Patient observed directly with PO's: Yes Type of PO's observed: Ice chips Liquids provided via: Teaspoon Oral Phase Signs & Symptoms: Prolonged oral phase;Prolonged bolus formation Pharyngeal Phase Signs & Symptoms: Immediate throat clear Type of cueing: Verbal;Tactile;Visual Amount of cueing: Moderate   GO    [name], [place] CCC-SLP [phone]  [name] 04/07/2012, 12:54 PM

## 2012-04-07 NOTE — Progress Notes (Signed)
Anemia   Transfuse 1 U PRBC

## 2012-04-07 NOTE — Progress Notes (Signed)
Prior notes reviewed.  Case discussed with Dr. Oletta Lamas.  Elevated LFTs likely multifactorial, but suspect strong component of sepsis-related cholestasis.  Not much else to offer from GI perspective at this time.  Will sign-off.  Please call with questions.

## 2012-04-07 NOTE — Progress Notes (Signed)
eLink Physician-Brief Progress Note Patient Name: Connor Small DOB: [birth year] MRN: [id number]  Date of Service  04/07/2012   HPI/Events of Note   Pain not controlled  eICU Interventions  Will increase patch fent to 50 in am at 8 am  Rn updated to change in am  Updated wife   Intervention Category Major Interventions: Delirium, psychosis, severe agitation - evaluation and management  Raylene Miyamoto. 04/07/2012, 11:38 PM

## 2012-04-07 NOTE — Progress Notes (Signed)
PULMONARY  / CRITICAL CARE MEDICINE  Name: Connor Small MRN: [id number] DOB: Jan 28, 1960    ADMISSION DATE:  02/26/2012 CONSULTATION DATE:  2/5  REFERRING MD :  Karilyn Cota  CHIEF COMPLAINT:  Acute resp failure   BRIEF PATIENT DESCRIPTION:  54 YOM admitted to [facility] for Cdiff on 2/2. Underwent decompressive cecostomy 2/3, initially was better, then worsened on 2/4 with increased abdominal distension and tenderness, decreased renal function.  He was taken back to the OR on 2/4 for total abdominal colectomy and end ileostomy.  Post op he developed ARDS and septic shock and was being treated for this.  The morning of 2/6 he had new onset of A-fib and transferred to [facility].   Course complicated by severe hypoalbuminemia, elevated LFTs  -cholestatic without biliary obstruction & pre-renal failure. Hypotensive 3/2 am requiring albumin  SIGNIFICANT EVENTS / STUDIES:  2/2 Admit to [facility] with C diff, toxic megacolon 2/3 Decompressive Cecostomy 2/4 Total abdominal colectomy and end ileostomy 2/6 New A-fib and transfer to [facility]. 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 2/10 CT abd/pelvis: small hematoma likely subcapsular spleen, JPs wnl 2/11 trial TFs  2/11 UE venous dopplers: Findings consistent with superficial vein thrombosis involving the right basilic vein 2/14 LE venous dopplers: No evidence of deep vein thrombosis 2/14 LUE venous doppler: No evidence of deep vein or superficial thrombosis involving the left upper extremity  2/15 neg 4 l bal on lasix drip, lasix d/c  2/16 poor neuro status, CT head: [facility] 2/17 CRRT initiated, high pressor needs 2/17 RUQ [place]: Gallbladder wall thickening in the absence of sludge or stones but no biliary ductal dilatation 2/18 reduced vasopressor reqts 2/19 New onset thrombocytopenia. Heparin D/C'd. Argatroban  initiated 2/19 [place] abd (repeat): No explanation for elevated liver function tests 2/20 reduction pressors, improved alertness, extubated 2/20 LE venous dopplers: No evidence of deep vein or superficial thrombosis 2/21 bleeding overnight from JP's, argatroban turned off, lower pressor needs 2/22 off pressors, on RA, converted to NSR 2/22 Carotid dopplers: No significant extracranial carotid artery stenosis demonstrated 2/25 Cystoscopy with bilateral retrograde pyelograms. No extravasation of contrast from either ureteral orifice. No evidence of hydroureteronephrosis. Unremarkable urinary bladder. B ureteral stents placed 2/27 MRI brain: Scattered small acute to subacute infarcts in both cerebral hemispheres, brainstem, and right cerebellum. Favor sequelae of emboli from a cardiac or proximal aortic source. Main alternate consideration is hypotensive episode 2/27 MRCP: No intrahepatic bile duct dilatation or common bile duct dilatation. No gallstones identified. Large subcapsular fluid collection encases the spleen. This is of indeterminate etiology. This may represent a subacute to chronic subcapsular hematoma or abscess. 2/28 SLP eval: FEES performed. Oropharyngeal weakness noted. Recommend replacement of NGT to supplement PO intake 3/1 Renal signed off 3/3- bowel evisceration, to OR 3/3- no perf / abscess, wound vac open could not close, Liver BX, some oozing, post op resp failure 3/3 echo repeat - improved ef55%, poor windows 3/4- low dose pressor needs 3/4- extubated, high output again from JP's 3/6- OR trip, partial closure wound 3/7- renal fxn continues to improve 3/8- doppler legs >>>Prelim NEG 3/9- hypotension, volume given 3/9- closed, retention service 3/10- GI bleed, required Transfusion, hemodynamics improved, ppi drip 3/12- bili down  LINES / TUBES: ETT ([facility]) 2/4>>> 2/20 PICC ([facility]) 2/4>>>2/11 Art Line ([facility]) 2/5>>>out L IJ CVL 2/06 >> out A line R fem 2/18>>>2/24 Rt  ij HD  2/17 >> out Left IJ 2/18 >>  ETT 3/3>>>out post op x 2  CULTURES: BCx2 2/4>>>negative BCx2 2/3>>>negative UC 2/3- Negative MRSA PCR 2/3- Negative C diff 2/6>>>Positive Body fluid 2/9>>>WBCs, no organisms 2/15 BC x 2 >> NEG 2/15 Sputum >>>NF 3/2 bc>>>NGTD 3/2 urine>>>neg 3/2 sputum>>>neg 3/5 cdiff>>>neg  PATH: 3/3 liver bx>>>BENIGN LIVER WITH CHOLESTASIS, The biopsy findings are that of extrahepatic biliary obstruction. No noted primary sclerosing cholangitis.  ANTIBIOTICS: Flagyl 2/3>> d/c'd Vanc 2/6 (IV) >>>2/7 azactam 2/6>>>2/7 Oral vanc 2/6>>>off IV vanc 2/16 >2/24 Aztreonam 2/16 >2/17 Imipenem 2/17>>>2/24 mycofungin 2/18>>>2/24  cipro 3/3>>>3/6 azactam 3/2>>>3/3 vanc 3/2>>>3/5 Flagyl 3/3>>>3/5  SUBJECTIVE: Looks stable sitting up in chair.  No increased wob.  Still requiring tube feeds due to swallowing issues.  Surgery following wound  VITAL SIGNS: Temp:  [97.6 F (36.4 C)-98.2 F (36.8 C)] 98.1 F (36.7 C) (03/15 1145) Pulse Rate:  [106-128] 115 (03/15 1130) Resp:  [14-23] 19 (03/15 1130) BP: (90-114)/(60-75) 114/67 mmHg (03/15 1115) SpO2:  [97 %-99 %] 98 % (03/15 1115)  INTAKE / OUTPUT: Intake/Output     03/14 0701 - 03/15 0700 03/15 0701 - 03/16 0700   I.V. (mL/kg) 480 (6.1) 80 (1)   Blood  352.5   NG/GT 1615 425   Total Intake(mL/kg) 2095 (26.6) 857.5 (10.9)   Urine (mL/kg/hr) 450 (0.2) 350 (0.8)   Stool 350 (0.2)    Total Output 800 350   Net +1295 +507.5        Urine Occurrence 1 x      PHYSICAL EXAMINATION: General Icteric, no increased wob Neuro: follows commands well, no changes HEENT: Sclericterus, no TMG or LN Cardiovascular: regular mild tachy Lungs: CTA, good air entry Abdomen: dressing in place, bs + Musculoskeletal: no edema or cyanosis Skin:  No rash  LABS:  CBC    Component Value Date/Time   WBC 4.1 04/07/2012 0500   RBC 2.14* 04/07/2012 0500   HGB 6.6* 04/07/2012 0500   HCT 21.0* 04/07/2012 0500   PLT 112* 04/07/2012  0500   MCV 98.1 04/07/2012 0500   MCH 30.8 04/07/2012 0500   MCHC 31.4 04/07/2012 0500   RDW 22.8* 04/07/2012 0500   LYMPHSABS 0.3* 04/02/2012 1158   MONOABS 0.3 04/02/2012 1158   EOSABS 0.1 04/02/2012 1158   BASOSABS 0.0 04/02/2012 1158      Recent Labs Lab 04/06/12 1118 04/06/12 1612 04/06/12 1937 04/07/12 0758 04/07/12 1132  GLUCAP 116* 134* 132* 130* 114*    No results found.   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  Continue to follow closely  Pulm :   Pleural effusion, L > R Remaining effusion but no distress -IS encouraged  Renal   ARF (acute renal failure), Cr leveling off -?now CKD   Hypovolemia 3/9, ATN 3/10 from GI bleed -recheck bmet in am

## 2012-04-08 LAB — BASIC METABOLIC PANEL
BUN: 61 mg/dL — ABNORMAL HIGH (ref 6–23)
CO2: 21 mEq/L (ref 19–32)
Chloride: 120 mEq/L — ABNORMAL HIGH (ref 96–112)
Creatinine, Ser: 1.37 mg/dL — ABNORMAL HIGH (ref 0.50–1.35)
GFR calc Af Amer: 66 mL/min — ABNORMAL LOW (ref >90)

## 2012-04-08 LAB — CULTURE, RESPIRATORY W GRAM STAIN: Culture: NORMAL

## 2012-04-08 LAB — CBC
HCT: 24.7 % — ABNORMAL LOW (ref 39.0–52.0)
MCHC: 32.4 g/dL (ref 30.0–36.0)
MCV: 97.6 fL (ref 78.0–100.0)
RDW: 23.9 % — ABNORMAL HIGH (ref 11.5–15.5)

## 2012-04-08 MED ORDER — FREE WATER
200.0000 mL | Freq: Three times a day (TID) | Status: DC
  Administered 2012-04-08 – 2012-04-09 (×3): 200 mL

## 2012-04-08 NOTE — Progress Notes (Signed)
General Surgery Note  LOS: 42 days  POD -   7 Days Post-Op Room - 2603  Assessment/Plan: 1.  removal of abdominal vac dressing and abdominal closure - B. Hoxworth - 04/01/2012  For wound dehiscence.  Wound being packed BID  Wound looks good.  Continue local wound care.   2.  Total abdominal colectomy with end ileostomy, intra-abdominal drain placement x 2 by Dr. Elby Showers (02/28/12)  3.  NGT for tube feedings. 4.  Left IJ. 5.  Badly deconditioned. 6.  Anemia - Hgb - 8.0 - 04/08/2012  Transfused one unit yesterday.  Subjective:  Alert, but does not move much.  Soft voice.  Some pain issues, but comfortable now.  Objective:   Filed Vitals:   04/08/12 0724  BP: 109/71  Pulse: 110  Temp: 97.4 F (36.3 C)  Resp: 23     Intake/Output from previous day:  03/15 0701 - 03/16 0700 In: 2757.5 [I.V.:490; Blood:352.5; NG/GT:1915] Out: 2100 [Urine:1200; Stool:900]  Intake/Output this shift:  Total I/O In: 210 [I.V.:40; NG/GT:170] Out: -    Physical Exam:   General: WN WM who is alert.Marland Kitchen  He can barely move his extremities secondary to deconditioning.    HEENT: Normal. Pupils equal.  Has NGT for feeding. .   Abdomen: Ostomy okay.    Wound: Open, but clean.  Has retention sutures.   Lab Results:    Recent Labs  04/07/12 1920 04/08/12 0550  WBC 3.7* 3.3*  HGB 8.5* 8.0*  HCT 26.3* 24.7*  PLT 103* 104*    BMET   Recent Labs  04/06/12 0453 04/08/12 0550  NA 147* 149*  K 4.1 3.8  CL 117* 120*  CO2 21 21  GLUCOSE 131* 124*  BUN 57* 61*  CREATININE 1.40* 1.37*  CALCIUM 7.8* 8.0*    PT/INR  No results found for this basename: LABPROT, INR,  in the last 72 hours  ABG  No results found for this basename: PHART, PCO2, PO2, HCO3,  in the last 72 hours   Studies/Results:  Dg Chest Port 1 View  04/07/2012  *RADIOLOGY REPORT*  Clinical Data: Congestion and productive cough.  PORTABLE CHEST - 1 VIEW  Comparison: 03/31/2012  Findings: Enteric tube and left central venous  catheter appear unchanged in position.  There is persistent left pleural effusion with atelectasis or infiltration in the left mid and lower lungs. Changes are similar to previous study, allowing for differences in technique and patient positioning.  Right lung remains clear and expanded.  No pneumothorax.  Heart size is partially obscured by the left-sided process but appears normal.  IMPRESSION: No significant change since previous study.  Persistent left pleural effusion and basilar infiltration or atelectasis on the left.   Original Report Authenticated By: Lucienne Capers, M.D.      Anti-infectives:   Anti-infectives   Start     Dose/Rate Route Frequency Ordered Stop   04/01/12 1030  ciprofloxacin (CIPRO) IVPB 400 mg     400 mg 200 mL/hr over 60 Minutes Intravenous To Surgery 04/01/12 1028 04/01/12 1058   03/26/12 2100  vancomycin (VANCOCIN) IVPB 1000 mg/200 mL premix  Status:  Discontinued     1,000 mg 200 mL/hr over 60 Minutes Intravenous Every 24 hours 03/25/12 1934 03/28/12 0913   03/26/12 0945  ciprofloxacin (CIPRO) IVPB 400 mg  Status:  Discontinued     400 mg 200 mL/hr over 60 Minutes Intravenous Every 12 hours 03/26/12 0933 03/29/12 1331   03/26/12 0600  aztreonam (AZACTAM) 1  g in dextrose 5 % 50 mL IVPB  Status:  Discontinued     1 g 100 mL/hr over 30 Minutes Intravenous 3 times per day 03/25/12 1934 03/26/12 0929   03/25/12 2200  metroNIDAZOLE (FLAGYL) IVPB 500 mg  Status:  Discontinued     500 mg 100 mL/hr over 60 Minutes Intravenous Every 8 hours 03/25/12 1909 03/28/12 0913   03/25/12 2000  vancomycin (VANCOCIN) IVPB 1000 mg/200 mL premix     1,000 mg 200 mL/hr over 60 Minutes Intravenous STAT 03/25/12 1933 03/25/12 2054   03/25/12 2000  aztreonam (AZACTAM) 1 g in dextrose 5 % 50 mL IVPB     1 g 100 mL/hr over 30 Minutes Intravenous STAT 03/25/12 1933 03/25/12 2024   03/20/12 1145  ciprofloxacin (CIPRO) IVPB 400 mg    Comments:  ON CALL TO OR   400 mg 200 mL/hr over 60  Minutes Intravenous  Once 03/20/12 1143 03/20/12 1722   03/18/12 1000  vancomycin (VANCOCIN) 1,500 mg in sodium chloride 0.9 % 500 mL IVPB  Status:  Discontinued     1,500 mg 250 mL/hr over 120 Minutes Intravenous Every 48 hours 03/17/12 1920 03/19/12 0840   03/14/12 1000  micafungin (MYCAMINE) 100 mg in sodium chloride 0.9 % 100 mL IVPB  Status:  Discontinued     100 mg 100 mL/hr over 1 Hours Intravenous Daily 03/13/12 0632 03/19/12 0840   03/13/12 1000  micafungin (MYCAMINE) 100 mg in sodium chloride 0.9 % 100 mL IVPB     100 mg 100 mL/hr over 1 Hours Intravenous Daily 03/13/12 0632 03/13/12 1311   03/12/12 1800  vancomycin (VANCOCIN) IVPB 1000 mg/200 mL premix  Status:  Discontinued     1,000 mg 200 mL/hr over 60 Minutes Intravenous Every 24 hours 03/12/12 0859 03/17/12 1017   03/12/12 1000  imipenem-cilastatin (PRIMAXIN) 500 mg in sodium chloride 0.9 % 100 mL IVPB  Status:  Discontinued     500 mg 200 mL/hr over 30 Minutes Intravenous 3 times per day 03/12/12 0848 03/12/12 0909   03/12/12 1000  imipenem-cilastatin (PRIMAXIN) 250 mg in sodium chloride 0.9 % 100 mL IVPB  Status:  Discontinued     250 mg 200 mL/hr over 30 Minutes Intravenous 4 times per day 03/12/12 0909 03/19/12 0841   03/11/12 1200  vancomycin (VANCOCIN) 750 mg in sodium chloride 0.9 % 150 mL IVPB  Status:  Discontinued     750 mg 150 mL/hr over 60 Minutes Intravenous Every 12 hours 03/11/12 0204 03/12/12 0858   03/11/12 0215  vancomycin (VANCOCIN) 750 mg in sodium chloride 0.9 % 150 mL IVPB     750 mg 150 mL/hr over 60 Minutes Intravenous  Once 03/11/12 0204 03/11/12 0352   03/11/12 0215  aztreonam (AZACTAM) 1 g in dextrose 5 % 50 mL IVPB  Status:  Discontinued     1 g 100 mL/hr over 30 Minutes Intravenous 3 times per day 03/11/12 0204 03/12/12 0851   03/05/12 2200  metroNIDAZOLE (FLAGYL) IVPB 500 mg  Status:  Discontinued    Comments:  First dose ASAP   500 mg 100 mL/hr over 60 Minutes Intravenous Every 8 hours  03/05/12 1346 03/19/12 1509   03/02/12 1200  vancomycin (VANCOCIN) 50 mg/mL oral solution 500 mg  Status:  Discontinued     500 mg Oral 4 times per day 03/02/12 0913 03/19/12 1509   03/01/12 1445  vancomycin (VANCOCIN) 50 mg/mL oral solution 500 mg  Status:  Discontinued  500 mg Oral 3 times per day 03/01/12 1340 03/02/12 0913   03/01/12 1400  aztreonam (AZACTAM) 1 g in dextrose 5 % 50 mL IVPB  Status:  Discontinued     1 g 100 mL/hr over 30 Minutes Intravenous 3 times per day 03/01/12 1356 03/02/12 1146   03/01/12 1300  vancomycin (VANCOCIN) 1,250 mg in sodium chloride 0.9 % 250 mL IVPB  Status:  Discontinued     1,250 mg 166.7 mL/hr over 90 Minutes Intravenous Every 12 hours 03/01/12 1127 03/02/12 1146   02/29/12 0600  ciprofloxacin (CIPRO) IVPB 400 mg     400 mg 200 mL/hr over 60 Minutes Intravenous On call to O.R. 02/28/12 1409 02/28/12 1438   02/28/12 2200  vancomycin (VANCOCIN) 500 mg in sodium chloride irrigation 0.9 % 60 mL ENEMA  Status:  Discontinued     500 mg Rectal 2 times daily 02/28/12 1504 02/28/12 1817   02/28/12 1200  vancomycin (VANCOCIN) 50 mg/mL oral solution 500 mg  Status:  Discontinued    Comments:  First dose ASAP   500 mg Per Tube 4 times per day 02/28/12 0814 02/28/12 1817   02/28/12 1000  vancomycin (VANCOCIN) IVPB 1000 mg/200 mL premix  Status:  Discontinued     1,000 mg 200 mL/hr over 60 Minutes Intravenous Every 12 hours 02/28/12 0845 03/01/12 1127   02/28/12 1000  vancomycin (VANCOCIN) 500 mg in sodium chloride irrigation 0.9 % 100 mL ENEMA  Status:  Discontinued     500 mg Rectal 2 times daily 02/28/12 0923 02/28/12 1504   02/27/12 1600  metroNIDAZOLE (FLAGYL) IVPB 500 mg  Status:  Discontinued     500 mg 100 mL/hr over 60 Minutes Intravenous Every 8 hours 02/27/12 1546 02/27/12 2000   02/27/12 1600  ciprofloxacin (CIPRO) IVPB 400 mg  Status:  Discontinued     400 mg 200 mL/hr over 60 Minutes Intravenous Every 12 hours 02/27/12 1547 02/27/12 2000    02/27/12 1545  metroNIDAZOLE (FLAGYL) IVPB 500 mg  Status:  Discontinued     500 mg 100 mL/hr over 60 Minutes Intravenous  Once 02/27/12 1535 02/28/12 0843   02/27/12 1545  vancomycin (VANCOCIN) 500 mg in sodium chloride 0.9 % 100 mL IVPB  Status:  Discontinued     500 mg 100 mL/hr over 60 Minutes Intravenous  Once 02/27/12 1535 02/27/12 1538   02/27/12 1545  ciprofloxacin (CIPRO) IVPB 400 mg  Status:  Discontinued     400 mg 200 mL/hr over 60 Minutes Intravenous  Once 02/27/12 1538 02/28/12 0843   02/27/12 1415  vancomycin (VANCOCIN) 500 mg in sodium chloride 0.9 % 100 mL IVPB     500 mg 100 mL/hr over 60 Minutes Intravenous  Once 02/27/12 1402 02/27/12 1411   02/27/12 1400  vancomycin (VANCOCIN) powder 500 mg  Status:  Discontinued     500 mg Other To Surgery 02/27/12 1359 02/27/12 1402   02/27/12 1200  metroNIDAZOLE (FLAGYL) IVPB 500 mg  Status:  Discontinued    Comments:  First dose ASAP   500 mg 100 mL/hr over 60 Minutes Intravenous Every 6 hours 02/27/12 0953 03/05/12 1346   02/27/12 1200  vancomycin (VANCOCIN) 50 mg/mL oral solution 250 mg  Status:  Discontinued    Comments:  First dose ASAP   250 mg Oral 4 times per day 02/27/12 0953 02/28/12 0814   02/26/12 1700  oseltamivir (TAMIFLU) capsule 75 mg     75 mg Oral  Once 02/26/12 1651 02/26/12 1715  Alphonsa Overall, MD, [place] Pager: [phone],   [facility] Surgery Office: [phone] 04/08/2012

## 2012-04-08 NOTE — Progress Notes (Signed)
Pt appears more comfortable today requiring pain  meds every 3 hours. Tolerated OOB for 6 hours.Producing increased flatus: no nausea today. Will continue to monitor.

## 2012-04-08 NOTE — Progress Notes (Signed)
PULMONARY  / CRITICAL CARE MEDICINE  Name: Connor Small MRN: [id number] DOB: 05-04-1960    ADMISSION DATE:  02/26/2012 CONSULTATION DATE:  2/5  REFERRING MD :  Karilyn Cota  CHIEF COMPLAINT:  Acute resp failure   BRIEF PATIENT DESCRIPTION:  67 YOM admitted to [facility] for Cdiff on 2/2. Underwent decompressive cecostomy 2/3, initially was better, then worsened on 2/4 with increased abdominal distension and tenderness, decreased renal function.  He was taken back to the OR on 2/4 for total abdominal colectomy and end ileostomy.  Post op he developed ARDS and septic shock and was being treated for this.  The morning of 2/6 he had new onset of A-fib and transferred to [facility].   Course complicated by severe hypoalbuminemia, elevated LFTs  -cholestatic without biliary obstruction & pre-renal failure. Hypotensive 3/2 am requiring albumin  SIGNIFICANT EVENTS / STUDIES:  2/2 Admit to [facility] with C diff, toxic megacolon 2/3 Decompressive Cecostomy 2/4 Total abdominal colectomy and end ileostomy 2/6 New A-fib and transfer to [facility] health. 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 2/10 CT abd/pelvis: small hematoma likely subcapsular spleen, JPs wnl 2/11 trial TFs  2/11 UE venous dopplers: Findings consistent with superficial vein thrombosis involving the right basilic vein 2/14 LE venous dopplers: No evidence of deep vein thrombosis 2/14 LUE venous doppler: No evidence of deep vein or superficial thrombosis involving the left upper extremity  2/15 neg 4 l bal on lasix drip, lasix d/c  2/16 poor neuro status, CT head: [place] 2/17 CRRT initiated, high pressor needs 2/17 RUQ [place]: Gallbladder wall thickening in the absence of sludge or stones but no biliary ductal dilatation 2/18 reduced vasopressor reqts 2/19 New onset thrombocytopenia. Heparin D/C'd. Argatroban  initiated 2/19 [place] abd (repeat): No explanation for elevated liver function tests 2/20 reduction pressors, improved alertness, extubated 2/20 LE venous dopplers: No evidence of deep vein or superficial thrombosis 2/21 bleeding overnight from JP's, argatroban turned off, lower pressor needs 2/22 off pressors, on RA, converted to NSR 2/22 Carotid dopplers: No significant extracranial carotid artery stenosis demonstrated 2/25 Cystoscopy with bilateral retrograde pyelograms. No extravasation of contrast from either ureteral orifice. No evidence of hydroureteronephrosis. Unremarkable urinary bladder. B ureteral stents placed 2/27 MRI brain: Scattered small acute to subacute infarcts in both cerebral hemispheres, brainstem, and right cerebellum. Favor sequelae of emboli from a cardiac or proximal aortic source. Main alternate consideration is hypotensive episode 2/27 MRCP: No intrahepatic bile duct dilatation or common bile duct dilatation. No gallstones identified. Large subcapsular fluid collection encases the spleen. This is of indeterminate etiology. This may represent a subacute to chronic subcapsular hematoma or abscess. 2/28 SLP eval: FEES performed. Oropharyngeal weakness noted. Recommend replacement of NGT to supplement PO intake 3/1 Renal signed off 3/3- bowel evisceration, to OR 3/3- no perf / abscess, wound vac open could not close, Liver BX, some oozing, post op resp failure 3/3 echo repeat - improved ef55%, poor windows 3/4- low dose pressor needs 3/4- extubated, high output again from JP's 3/6- OR trip, partial closure wound 3/7- renal fxn continues to improve 3/8- doppler legs >>>Prelim NEG 3/9- hypotension, volume given 3/9- closed, retention service 3/10- GI bleed, required Transfusion, hemodynamics improved, ppi drip 3/12- bili down  LINES / TUBES: ETT ([facility]) 2/4>>> 2/20 PICC ([facility]) 2/4>>>2/11 Art Line ([facility]) 2/5>>>out L IJ CVL 2/06 >> out A line R fem 2/18>>>2/24 Rt  ij HD  2/17 >> out Left IJ 2/18 >>  ETT 3/3>>>out post op x 2  CULTURES: BCx2 2/4>>>negative BCx2 2/3>>>negative UC 2/3- Negative MRSA PCR 2/3- Negative C diff 2/6>>>Positive Body fluid 2/9>>>WBCs, no organisms 2/15 BC x 2 >> NEG 2/15 Sputum >>>NF 3/2 bc>>>NGTD 3/2 urine>>>neg 3/2 sputum>>>neg 3/5 cdiff>>>neg  PATH: 3/3 liver bx>>>BENIGN LIVER WITH CHOLESTASIS, The biopsy findings are that of extrahepatic biliary obstruction. No noted primary sclerosing cholangitis.  ANTIBIOTICS: Flagyl 2/3>> d/c'd Vanc 2/6 (IV) >>>2/7 azactam 2/6>>>2/7 Oral vanc 2/6>>>off IV vanc 2/16 >2/24 Aztreonam 2/16 >2/17 Imipenem 2/17>>>2/24 mycofungin 2/18>>>2/24  cipro 3/3>>>3/6 azactam 3/2>>>3/3 vanc 3/2>>>3/5 Flagyl 3/3>>>3/5  SUBJECTIVE: Looks stable sitting up in chair.  No increased wob.  Still requiring tube feeds due to swallowing issues.  Surgery following wound.  VITAL SIGNS: Temp:  [97.4 F (36.3 C)-98.4 F (36.9 C)] 97.9 F (36.6 C) (03/16 1159) Pulse Rate:  [106-114] 108 (03/16 1159) Resp:  [12-23] 23 (03/16 0724) BP: (108-117)/(64-78) 117/76 mmHg (03/16 1159) SpO2:  [97 %-100 %] 100 % (03/16 1159) Weight:  [79 kg (174 lb 2.6 oz)] 79 kg (174 lb 2.6 oz) (03/16 0335)  INTAKE / OUTPUT: Intake/Output     03/15 0701 - 03/16 0700 03/16 0701 - 03/17 0700   I.V. (mL/kg) 490 (6.2) 80 (1)   Blood 352.5    NG/GT 1915 340   Total Intake(mL/kg) 2757.5 (34.9) 420 (5.3)   Urine (mL/kg/hr) 1200 (0.6)    Stool 900 (0.5) 200 (0.4)   Total Output 2100 200   Net +657.5 +220          PHYSICAL EXAMINATION: General Icteric, no increased wob Neuro: follows commands well, moves all 4.  HEENT: Sclericterus, no TMG or LN Cardiovascular: regular mild tachy Lungs: CTA, good air entry, mild basilar crackles. Abdomen: dressing in place, bs + Musculoskeletal: no edema or cyanosis Skin:  No rash  LABS:  CBC    Component Value Date/Time   WBC 3.3* 04/08/2012 0550   RBC 2.53* 04/08/2012 0550    HGB 8.0* 04/08/2012 0550   HCT 24.7* 04/08/2012 0550   PLT 104* 04/08/2012 0550   MCV 97.6 04/08/2012 0550   MCH 31.6 04/08/2012 0550   MCHC 32.4 04/08/2012 0550   RDW 23.9* 04/08/2012 0550   LYMPHSABS 0.3* 04/02/2012 1158   MONOABS 0.3 04/02/2012 1158   EOSABS 0.1 04/02/2012 1158   BASOSABS 0.0 04/02/2012 1158      Recent Labs Lab 04/07/12 1132 04/07/12 1620 04/07/12 1929 04/07/12 2317 04/08/12 0335  GLUCAP 114* 122* 83 113* 125*    Dg Chest Port 1 View  04/07/2012  *RADIOLOGY REPORT*  Clinical Data: Congestion and productive cough.  PORTABLE CHEST - 1 VIEW  Comparison: 03/31/2012  Findings: Enteric tube and left central venous catheter appear unchanged in position.  There is persistent left pleural effusion with atelectasis or infiltration in the left mid and lower lungs. Changes are similar to previous study, allowing for differences in technique and patient positioning.  Right lung remains clear and expanded.  No pneumothorax.  Heart size is partially obscured by the left-sided process but appears normal.  IMPRESSION: No significant change since previous study.  Persistent left pleural effusion and basilar infiltration or atelectasis on the left.   Original Report Authenticated By: Burman Nieves, M.D.      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  Will check again in am, and if continues to fall, may need re-evaluation of abdomen with ct.  Pulm :   Pleural effusion, L  > R Remaining effusion but no distress -IS encouraged  Renal   ARF (acute renal failure), Cr leveling off -?now CKD   Hypovolemia 3/9, ATN 3/10 from GI bleed -renal function fairly stable on bloodwork. -will start free water for hypernatremia.

## 2012-04-09 ENCOUNTER — Inpatient Hospital Stay (HOSPITAL_COMMUNITY): Payer: BC Managed Care – PPO

## 2012-04-09 LAB — BASIC METABOLIC PANEL
BUN: 61 mg/dL — ABNORMAL HIGH (ref 6–23)
Chloride: 123 mEq/L — ABNORMAL HIGH (ref 96–112)
GFR calc Af Amer: 70 mL/min — ABNORMAL LOW (ref >90)
Glucose, Bld: 113 mg/dL — ABNORMAL HIGH (ref 70–99)
Potassium: 3.7 mEq/L (ref 3.5–5.1)

## 2012-04-09 LAB — CBC
HCT: 26.3 % — ABNORMAL LOW (ref 39.0–52.0)
Hemoglobin: 8.2 g/dL — ABNORMAL LOW (ref 13.0–17.0)
MCH: 30.7 pg (ref 26.0–34.0)
MCHC: 31.2 g/dL (ref 30.0–36.0)

## 2012-04-09 LAB — GLUCOSE, CAPILLARY: Glucose-Capillary: 116 mg/dL — ABNORMAL HIGH (ref 70–99)

## 2012-04-09 MED ORDER — LIDOCAINE 5 % EX PTCH
1.0000 | MEDICATED_PATCH | CUTANEOUS | Status: DC
  Administered 2012-04-09 – 2012-04-17 (×9): 1 via TRANSDERMAL
  Filled 2012-04-09 (×10): qty 1

## 2012-04-09 MED ORDER — FREE WATER
200.0000 mL | Freq: Four times a day (QID) | Status: DC
  Administered 2012-04-09 – 2012-04-10 (×5): 200 mL

## 2012-04-09 MED ORDER — ALUM & MAG HYDROXIDE-SIMETH 200-200-20 MG/5ML PO SUSP
15.0000 mL | ORAL | Status: DC | PRN
  Administered 2012-04-09 – 2012-04-14 (×10): 15 mL
  Administered 2012-04-14: 15:00:00
  Administered 2012-04-15 – 2012-04-17 (×7): 15 mL
  Filled 2012-04-09 (×21): qty 30

## 2012-04-09 MED ORDER — FENTANYL CITRATE 0.05 MG/ML IJ SOLN
25.0000 ug | INTRAMUSCULAR | Status: DC
  Administered 2012-04-09 (×6): 100 ug via INTRAVENOUS
  Administered 2012-04-10: 50 ug via INTRAVENOUS
  Administered 2012-04-10 (×2): 100 ug via INTRAVENOUS
  Administered 2012-04-10: 25 ug via INTRAVENOUS
  Administered 2012-04-10: 100 ug via INTRAVENOUS
  Administered 2012-04-10: 25 ug via INTRAVENOUS
  Administered 2012-04-10: 50 ug via INTRAVENOUS
  Administered 2012-04-10: 25 ug via INTRAVENOUS
  Administered 2012-04-10 – 2012-04-11 (×4): 100 ug via INTRAVENOUS
  Administered 2012-04-11 (×2): 50 ug via INTRAVENOUS
  Administered 2012-04-11: 100 ug via INTRAVENOUS
  Administered 2012-04-12 (×2): 25 ug via INTRAVENOUS
  Administered 2012-04-12 (×2): 50 ug via INTRAVENOUS
  Administered 2012-04-13: 25 ug via INTRAVENOUS
  Administered 2012-04-13: 50 ug via INTRAVENOUS
  Administered 2012-04-14 – 2012-04-15 (×3): 100 ug via INTRAVENOUS
  Administered 2012-04-15: 25 ug via INTRAVENOUS
  Administered 2012-04-15 – 2012-04-16 (×2): 50 ug via INTRAVENOUS
  Administered 2012-04-16: 25 ug via INTRAVENOUS
  Filled 2012-04-09 (×36): qty 2

## 2012-04-09 MED ORDER — FENTANYL CITRATE 0.05 MG/ML IJ SOLN: 50.0000 ug | INTRAMUSCULAR | Status: DC | PRN

## 2012-04-09 MED ORDER — POTASSIUM CHLORIDE 20 MEQ/15ML (10%) PO LIQD
40.0000 meq | Freq: Once | ORAL | Status: AC
  Administered 2012-04-09: 40 meq
  Filled 2012-04-09: qty 30

## 2012-04-09 NOTE — Progress Notes (Signed)
Clinical Social Worker staffed case with [facility] and reviewed wife's wishes for CIR or home with home health.  CSW to sign off at this time, please re consult if needed.   Dala Dock, MSW, [place]

## 2012-04-09 NOTE — Progress Notes (Signed)
Occupational Therapy Treatment Patient Details Name: Connor Small MRN: [id number] DOB: 1960-07-15 Today's Date: 04/09/2012 Time: 3818-2993 OT Time Calculation (min): 10 min  OT Assessment / Plan / Recommendation Comments on Treatment Session Seen  for splint check. Pt tolerating without c/o. Continue to wear throughout the day with splint checks q 2 hours . Educated wife on donning/doffing splint and wearing schedule. Information sheet placed above bed. CNA educated. will contiue to foloow.    Follow Up Recommendations  CIR    Barriers to Discharge       Equipment Recommendations  3 in 1 bedside comode;Tub/shower bench    Recommendations for Other Services Rehab consult  Frequency Min 3X/week   Plan Discharge plan remains appropriate    Precautions / Restrictions Precautions Precautions: Fall Precaution Comments: Abd binder, ostomy, panda (subluxed shoulders) Required Braces or Orthoses: Other Brace/Splint Other Brace/Splint: R wrist cock up slint   Pertinent Vitals/Pain no apparent distress     ADL  ADL Comments: Follow up for splint check. Pt tolerating without c/o. Sheet placed above bed.    OT Diagnosis:    OT Problem List:   OT Treatment Interventions:     OT Goals Acute Rehab OT Goals OT Goal Formulation: With patient Time For Goal Achievement: 04/16/12 Potential to Achieve Goals: Good ADL Goals Pt Will Perform Grooming: with mod assist;Sitting, chair;Supported;with cueing (comment type and amount) ADL Goal: Grooming - Progress: Progressing toward goals Pt Will Perform Upper Body Bathing: with mod assist;Sitting, chair;Supported;with cueing (comment type and amount) ADL Goal: Upper Body Bathing - Progress: Progressing toward goals Additional ADL Goal #1: Pt will complete bed mobility with mod A in preparation for ADL.  ADL Goal: Additional Goal #1 - Progress: Progressing toward goals Additional ADL Goal #2: Pt will sit EOB with min A supported in preparation  for ADL ADL Goal: Additional Goal #2 - Progress: Progressing toward goals Arm Goals Pt Will Complete Theraputty Exer: to increase strength;Bilateral upper extremities;with minimal assist;Other (comment) Arm Goal: Theraputty Exercises - Progress: Progressing toward goal Additional Arm Goal #1: Wife will complete P/AAROM BUE with S Arm Goal: Additional Goal #1 - Progress: Progressing toward goals Additional Arm Goal #2: complete PNF D1/D2 patterns with LUE in preparation for ADL with min A in supine. Arm Goal: Additional Goal #2 - Progress: Progressing toward goals Miscellaneous OT Goals Miscellaneous OT Goal #1: Wife will complete edema control techniques with S R hand OT Goal: Miscellaneous Goal #1 - Progress: Met Miscellaneous OT Goal #2: Demonstrate functional grasp/release R hand with use of orthotic with min A  OT Goal: Miscellaneous Goal #2 - Progress: Progressing toward goals Miscellaneous OT Goal #3: Pt will tolerate r wrist cock up splint without c/o during the day. Wife indpenednt with donning/ doffing splint. OT Goal: Miscellaneous Goal #3 - Progress: Goal set today  Visit Information  Last OT Received On: 04/09/12 Assistance Needed: +2    Subjective Data      Prior Functioning       Cognition  Cognition Overall Cognitive Status: Impaired Area of Impairment: Attention Difficult to assess due to: Impaired communication Arousal/Alertness: Awake/alert              End of Session OT - End of Session Equipment Utilized During Treatment: Other (comment) (binder) Activity Tolerance: Patient tolerated treatment well Patient left: in chair;with call bell/phone within reach;with nursing in room;with family/visitor present Nurse Communication: Mobility status;Other (comment) (need to support B elbows. splint wearing schedule)  GO  Karielle Davidow,HILLARY 04/09/2012, 1:09 PM [facility], OTR/L  [phone] 04/09/2012

## 2012-04-09 NOTE — Progress Notes (Signed)
Rehab Admissions Coordinator Note:  Patient was screened by Cleatrice Burke for appropriateness for an Inpatient Acute Rehab Consult.  At this time, we are recommending Inpatient Rehab consult.  Cleatrice Burke 04/09/2012, 2:14 PM  I can be reached at [phone].

## 2012-04-09 NOTE — Progress Notes (Signed)
PULMONARY  / CRITICAL CARE MEDICINE  Name: Connor Small MRN: [id number] DOB: 1960/12/14    ADMISSION DATE:  02/26/2012 CONSULTATION DATE:  2/5  REFERRING MD :  Karilyn Cota  CHIEF COMPLAINT:  Acute resp failure   BRIEF PATIENT DESCRIPTION:  49 YOM admitted to [facility] for Cdiff on 2/2. Underwent decompressive cecostomy 2/3, initially was better, then worsened on 2/4 with increased abdominal distension and tenderness, decreased renal function.  He was taken back to the OR on 2/4 for total abdominal colectomy and end ileostomy.  Post op he developed ARDS and septic shock and was being treated for this.  The morning of 2/6 he had new onset of A-fib and transferred to [facility].   Course complicated by severe hypoalbuminemia, elevated LFTs  -cholestatic without biliary obstruction & pre-renal failure. Hypotensive 3/2 am requiring albumin  LINES / TUBES: ETT ([facility]) 2/4>>> 2/20 PICC ([facility]) 2/4>>>2/11 Art Line ([facility]) 2/5>>>out L IJ CVL 2/06 >> out A line R fem 2/18>>>2/24 Rt ij HD 2/17 >> out Left IJ 2/18 >>  ETT 3/3>>>out post op x 2  CULTURES: BCx2 2/4>>>negative BCx2 2/3>>>negative UC 2/3- Negative MRSA PCR 2/3- Negative C diff 2/6>>>Positive Body fluid 2/9>>>WBCs, no organisms 2/15 BC x 2 >> NEG 2/15 Sputum >>>NF 3/2 bc>>>NGTD 3/2 urine>>>neg 3/2 sputum>>>neg 3/5 cdiff>>>neg  PATH: 3/3 liver bx>>>BENIGN LIVER WITH CHOLESTASIS, The biopsy findings are that of extrahepatic biliary obstruction. No noted primary sclerosing cholangitis.  ANTIBIOTICS: Flagyl 2/3>> d/c'd Vanc 2/6 (IV) >>>2/7 azactam 2/6>>>2/7 Oral vanc 2/6>>>off IV vanc 2/16 >2/24 Aztreonam 2/16 >2/17 Imipenem 2/17>>>2/24 mycofungin 2/18>>>2/24  cipro 3/3>>>3/6 azactam 3/2>>>3/3 vanc 3/2>>>3/5 Flagyl 3/3>>>3/5   SIGNIFICANT EVENTS / STUDIES:  2/2 Admit to [facility] with C diff, toxic megacolon 2/3 Decompressive Cecostomy 2/4 Total abdominal colectomy and end ileostomy 2/6 New A-fib and transfer to [facility]  [facility]. 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 2/10 CT abd/pelvis: small hematoma likely subcapsular spleen, JPs wnl 2/11 trial TFs  2/11 UE venous dopplers: Findings consistent with superficial vein thrombosis involving the right basilic vein 2/14 LE venous dopplers: No evidence of deep vein thrombosis 2/14 LUE venous doppler: No evidence of deep vein or superficial thrombosis involving the left upper extremity  2/15 neg 4 l bal on lasix drip, lasix d/c  2/16 poor neuro status, CT head: [facility] 2/17 CRRT initiated, high pressor needs 2/17 RUQ [place]: Gallbladder wall thickening in the absence of sludge or stones but no biliary ductal dilatation 2/18 reduced vasopressor reqts 2/19 New onset thrombocytopenia. Heparin D/C'd. Argatroban initiated 2/19 [place] abd (repeat): No explanation for elevated liver function tests 2/20 reduction pressors, improved alertness, extubated 2/20 LE venous dopplers: No evidence of deep vein or superficial thrombosis 2/21 bleeding overnight from JP's, argatroban turned off, lower pressor needs 2/22 off pressors, on RA, converted to NSR 2/22 Carotid dopplers: No significant extracranial carotid artery stenosis demonstrated 2/25 Cystoscopy with bilateral retrograde pyelograms. No extravasation of contrast from either ureteral orifice. No evidence of hydroureteronephrosis. Unremarkable urinary bladder. B ureteral stents placed 2/27 MRI brain: Scattered small acute to subacute infarcts in both cerebral hemispheres, brainstem, and right cerebellum. Favor sequelae of emboli from a cardiac or proximal aortic source. Main alternate consideration is hypotensive episode 2/27 MRCP: No intrahepatic bile duct dilatation or common bile duct dilatation. No gallstones identified. Large subcapsular fluid collection encases the spleen. This is of  indeterminate etiology.  This may represent a subacute to chronic subcapsular hematoma or abscess. 2/28 SLP eval: FEES performed. Oropharyngeal weakness noted. Recommend replacement of NGT to supplement PO intake 3/1 Renal signed off 3/3- bowel evisceration, to OR 3/3- no perf / abscess, wound vac open could not close, Liver BX, some oozing, post op resp failure 3/3 echo repeat - improved ef55%, poor windows 3/4- low dose pressor needs 3/4- extubated, high output again from JP's 3/6- OR trip, partial closure wound 3/7- renal fxn continues to improve 3/8- doppler legs >>>Prelim NEG 3/9- hypotension, volume given 3/9- closed, retention service 3/10- GI bleed, required Transfusion, hemodynamics improved, ppi drip 3/12- bili down 04/08/12 - Looks stable sitting up in chair.  No increased wob.  Still requiring tube feeds due to swallowing issues.  Surgery following wound.   SUBJECTIVE/OVERNIGHT/INTERVAL HX 04/09/2012: Wife at bedside. She is very concerned that patient's pain needs are not being attended to especially during the night shift. She is requesting scheduled pain medications at night with hold orders./Parameters. She feels that patient's pain is less from abdominal incision and mostly from back and joints due to deconditioning and lying down. Nursing reports that he constantly as the pain medications on the clock  Physical therapy is requesting inpatient rehabilitation consult  VITAL SIGNS: Temp:  [97.2 F (36.2 C)-98.2 F (36.8 C)] 98.1 F (36.7 C) (03/17 1216) Pulse Rate:  [102-131] 106 (03/17 1300) Resp:  [12-23] 12 (03/17 1300) BP: (102-122)/(63-85) 108/73 mmHg (03/17 1216) SpO2:  [96 %-100 %] 99 % (03/17 1300)  INTAKE / OUTPUT: Intake/Output     03/16 0701 - 03/17 0700 03/17 0701 - 03/18 0700   I.V. (mL/kg) 460 (5.8) 150 (1.9)   Blood     NG/GT 2155 710   Total Intake(mL/kg) 2615 (33.1) 860 (10.9)   Urine (mL/kg/hr) 675 (0.4) 650 (0.9)   Stool 400 (0.2)    Total  Output 1075 650   Net +1540 +210        Stool Occurrence  401 x     PHYSICAL EXAMINATION: General very deconditioned male. Icteric, no increased wob Neuro: follows commands well, moves all 4.  HEENT: Sclericterus, no TMG or LN Cardiovascular: regular mild tachy Lungs: CTA, good air entry, mild basilar crackles. Abdomen: dressing in place, bs + Musculoskeletal: no edema or cyanosis Skin:  No rash  LABS:  CBC    Component Value Date/Time   WBC 3.5* 04/09/2012 0500   RBC 2.67* 04/09/2012 0500   HGB 8.2* 04/09/2012 0500   HCT 26.3* 04/09/2012 0500   PLT 113* 04/09/2012 0500   MCV 98.5 04/09/2012 0500   MCH 30.7 04/09/2012 0500   MCHC 31.2 04/09/2012 0500   RDW 24.0* 04/09/2012 0500   LYMPHSABS 0.3* 04/02/2012 1158   MONOABS 0.3 04/02/2012 1158   EOSABS 0.1 04/02/2012 1158   BASOSABS 0.0 04/02/2012 1158      Recent Labs Lab 04/07/12 2317 04/08/12 0335 04/08/12 1951 04/08/12 2357 04/09/12 0400  GLUCAP 113* 125* 86 113* 116*    Dg Chest Port 1 View  04/07/2012  *RADIOLOGY REPORT*  Clinical Data: Congestion and productive cough.  PORTABLE CHEST - 1 VIEW  Comparison: 03/31/2012  Findings: Enteric tube and left central venous catheter appear unchanged in position.  There is persistent left pleural effusion with atelectasis or infiltration in the left mid and lower lungs. Changes are similar to previous study, allowing for differences in technique and patient positioning.  Right lung remains clear and expanded.  No pneumothorax.  Heart size  is partially obscured by the left-sided process but appears normal.  IMPRESSION: No significant change since previous study.  Persistent left pleural effusion and basilar infiltration or atelectasis on the left.   Original Report Authenticated By: Burman Nieves, M.D.     .    Imaging x 48 hours Dg Chest Port 1 View  04/07/2012  *RADIOLOGY REPORT*  Clinical Data: Congestion and productive cough.  PORTABLE CHEST - 1 VIEW  Comparison: 03/31/2012   Findings: Enteric tube and left central venous catheter appear unchanged in position.  There is persistent left pleural effusion with atelectasis or infiltration in the left mid and lower lungs. Changes are similar to previous study, allowing for differences in technique and patient positioning.  Right lung remains clear and expanded.  No pneumothorax.  Heart size is partially obscured by the left-sided process but appears normal.  IMPRESSION: No significant change since previous study.  Persistent left pleural effusion and basilar infiltration or atelectasis on the left.   Original Report Authenticated By: Burman Nieves, M.D.     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 - 04/09/2012: Normal mental status P: Monitor  INFECTIOUS DISEASE  Recent Labs Lab 04/06/12 1215 04/07/12 0500 04/07/12 1920 04/08/12 0550 04/09/12 0500  WBC 4.4 4.1 3.7* 3.3* 3.5*  No results found for this basename: LATICACIDVEN, PROCALCITON,  in the last 168 hours  A:  Mild leukopenia P: Monitor  RENAL and electrolytes I/O last 3 completed shifts: In: 3905 [I.V.:730; NG/GT:3175] Out: 2125 [Urine:1325; Stool:800]     Recent Labs Lab 04/05/12 0439 04/05/12 2000 04/06/12 0453 04/08/12 0550 04/09/12 0500  NA 146* 146* 147* 149* 152*  K 2.9* 4.1 4.1 3.8 3.7  CL 115* 116* 117* 120* 123*  CO2 21 21 21 21 21   BUN 59* 57* 57* 61* 61*  CREATININE 1.43* 1.41* 1.40* 1.37* 1.30  CALCIUM 7.5* 7.7* 7.8* 8.0* 8.3*   A:ARF (acute renal failure), Cr leveling off -?now CKD   Hypovolemia 3/9, ATN 3/10 from GI bleed - 04/09/2012:  Creatinine is improving P: - Free water for hypernatremia  HEMATOLOGIC  Recent Labs Lab 04/02/12 2049  04/06/12 1215 04/07/12 0500 04/07/12 1920 04/08/12 0550 04/09/12 0500  HGB  --   < > 7.4* 6.6* 8.5* 8.0* 8.2*  HCT  --   < > 23.0* 21.0* 26.3* 24.7* 26.3*  PLT  --   < > 105* 112* 103* 104* 113*  INR 1.30  --   --   --   --   --   --   APTT 36  --   --   --   --   --   --   < > = values in this interval not displayed. 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 Wife not happy with current level of pain control. According to nursing he is looking at the clock every 2 hours for pain medication. The pain seems to be back and neuromuscular otherwise does not want to give him any muscular relaxants. He is happy with fentanyl. I will ensure that he gets fentanyl scheduled at night and as needed during the daytime and the wife is present at bedside in the daytime. As  of the fentanyl patch we'll place a lidocaine patch in the shoulder where he has pain.    GLOBAL: 04/09/2012: Inpatient rehabilitation consultation requested     Dr. Kalman Shan, M.D., [facility].C.P Pulmonary and Critical Care Medicine Staff Physician [facility] Pulmonary and Critical Care Pager: [phone], If no answer or between  15:00h - 7:00h: call [phone]  04/09/2012 4:43 PM

## 2012-04-09 NOTE — Progress Notes (Signed)
Asked by eMD to talk to patient's family per their request.  Per RN's documentation patient "almost fell" during two person pivot chair to bed transfer several hours earlier. He was then helped to bed and given pain medication. Per eMD evaluation - no acute issues.  There was no overt injury, which was confirmed by my personally talking to the nurse present during the incident.  Later patient complained of neck and side discomfort.  Patient appears comfortable, not in pain and is able to rotate his neck without apparent discomfort.  Explained to family (wife, brother) that in absence of overt injury expectant / symptomatic management is the best option in my opinion.  Wife appears upset, agitated, insists that patient did fall and thought she "did not see him to hurt anything he might have". She feels "something needs to be done" to investigate his condition (?), states "other doctors would order some tests" and accused me of "having poor bedside manner". Patient's brother is using inappropriate language, insisting on "taking it outside", threatening to complain to administration and requests my name though I introduced myself and my name is clearly visible on my hospital ID and my white coat ( I gave him my business card anyway). I attempted to deescalate tension, apologized for misunderstanding and I explained that every test should have indications and comes with side effects, but they want some reassurance that patient is OK. I inquired what actions would give them reassurance about patient's condition.  They are OK with me checking C-spine and chest x-rays.  Per Radiologist, portable C-spine x-ray would have limited diagnostic value. C-spine CT recommended.  Discussed that with family, they are OK with this. Orders placed. Will asked eMD to follow results.

## 2012-04-09 NOTE — Progress Notes (Signed)
Speech Language Pathology Dysphagia Treatment Patient Details Name: Connor Small MRN: [id number] DOB: [birth year] Today's Date: 04/09/2012 Time: 4287-6811 SLP Time Calculation (min): 25 min  Assessment / Plan / Recommendation Clinical Impression  Pt again seen for pharyngeal strengthening exercises. Pt did not tolerate more than minimal exercises due to pain. Pt continue to demonstrate overt evidence of aspiration following pudding thick liquids. Recommend another day of independent exercises and therapy with SLP (2pm tomorrow) then repeat swallow exam wednesday. Would pt tolerate MBS? Will discuss with PT and surgery.      Diet Recommendation  Continue with Current Diet: NPO    SLP Plan     Pertinent Vitals/Pain NA   Swallowing Goals  SLP Swallowing Goals Goal #3: Pt will complete base of tongue/pharyngeal/laryngeal strengthening exercises x10 each with moderate verbal cues.  Swallow Study Goal #3 - Progress: Progressing toward goal Goal #4: Pt will consume ice chips with swallow within 7 seconds of oral administration, 2 effortful swallows with max verbal cues.  Swallow Study Goal #4 - Progress: Progressing toward goal  General Temperature Spikes Noted: No Respiratory Status: Room air Behavior/Cognition: Alert;Cooperative;Pleasant mood Oral Cavity - Dentition: Adequate natural dentition Patient Positioning: Upright in chair  Oral Cavity - Oral Hygiene Does patient have any of the following "at risk" factors?: Nutritional status - inadequate Patient is HIGH RISK - Oral Care Protocol followed (see row info): Yes   Dysphagia Treatment Treatment focused on: Facilitation of pharyngeal phase Family/Caregiver Educated: wife Treatment Methods/Modalities: Effortful swallow;Masako Maneuver;Mendelsohn Maneuver Patient observed directly with PO's: Yes Type of PO's observed: Ice chips;Pudding-thick liquids Feeding: Total assist Liquids provided via: Teaspoon Oral Phase Signs &  Symptoms: Prolonged oral phase;Prolonged bolus formation Pharyngeal Phase Signs & Symptoms: Immediate throat clear Type of cueing: Verbal;Tactile;Visual Amount of cueing: Moderate   GO     Connor Small, Connor Small 04/09/2012, 4:31 PM

## 2012-04-09 NOTE — Progress Notes (Signed)
Physical Therapy Treatment Patient Details Name: Connor Small MRN: [id number] DOB: 06/25/1960 Today's Date: 04/09/2012 Time: 4401-0272 PT Time Calculation (min): 44 min  PT Assessment / Plan / Recommendation Comments on Treatment Session  Patient able to sit briefly unsupported at edge of bed today and per OT demonstrated improved trunk and head control with unsupported sitting.  Still proximally weak and needs cues for initiation of balance reactions in sitting.  Had some complaint of left foot pain with transfer which could be due to heel cord tightness, but noted complaints of increased warmth, swelling and errythema today.  Would be great CIR candidate.    Follow Up Recommendations  CIR     Does the patient have the potential to tolerate intense rehabilitation   yes  Barriers to Discharge  None      Equipment Recommendations  None recommended by PT    Recommendations for Other Services Rehab consult  Frequency Min 3X/week   Plan Discharge plan needs to be updated    Precautions / Restrictions Precautions Precautions: Fall Precaution Comments: Abd binder, ostomy, panda Required Braces or Orthoses: Other Brace/Splint Other Brace/Splint: R wrist cock up slint   Pertinent Vitals/Pain C/o abdominal pain and left foot pain    Mobility  Bed Mobility Bed Mobility: Rolling Left;Left Sidelying to Sit Rolling Right: 1: +2 Total assist Rolling Right: Patient Percentage: 10% Rolling Left: 1: +2 Total assist Rolling Left: Patient Percentage: 10% Left Sidelying to Sit: 1: +2 Total assist Left Sidelying to Sit: Patient Percentage: 10% Sitting - Scoot to Edge of Bed: 1: +2 Total assist Sitting - Scoot to Edge of Bed: Patient Percentage: 0% Details for Bed Mobility Assistance: reciprocal scoot on pad under pt; encouraged pt with each aspect of movement to increase participation level  Transfers Squat Pivot Transfers: 1: +2 Total assist Squat Pivot Transfers: Patient Percentage:  0% Details for Transfer Assistance: Bobath technique to pivot bed to chair with use of pad with bil LE blocked for transfer; did practice sit>squat from bed with max cues and increased time to initiate weight bearing into floor, performed x 2; however, did not initiate with bed>chair transfer      PT Goals Acute Rehab PT Goals Time For Goal Achievement: 04/23/12 Potential to Achieve Goals: Fair Pt will go Supine/Side to Sit: with +1 total assist PT Goal: Supine/Side to Sit - Progress: Progressing toward goal Pt will Sit at Edge of Bed: with mod assist;6-10 min;with unilateral upper extremity support PT Goal: Sit at Edge Of Bed - Progress: Updated due to goal met Pt will go Sit to Stand: with +2 total assist (pt 30%) PT Goal: Sit to Stand - Progress: Progressing toward goal Pt will Transfer Bed to Chair/Chair to Bed: with +1 total assist PT Transfer Goal: Bed to Chair/Chair to Bed - Progress: Not progressing (less assist documented today as compared to last visit)  Visit Information  Last PT Received On: 04/09/12 Assistance Needed: +2 PT/OT Co-Evaluation/Treatment: Yes    Subjective Data  Subjective: It was worth it.   Cognition  Cognition Overall Cognitive Status: Impaired Area of Impairment: Attention Difficult to assess due to: Impaired communication Arousal/Alertness: Psychologist, forensic Sitting - Balance Support: Bilateral upper extremity supported;Feet supported Static Sitting - Level of Assistance: 3: Mod assist;4: Min assist Static Sitting - Comment/# of Minutes: 20  End of Session PT - End of Session Activity Tolerance: Patient limited by fatigue Patient left: in chair;with call bell/phone within  reach Nurse Communication: Mobility status;Need for lift equipment   GP     WYNN,CYNDI 04/09/2012, 2:02 PM [place], [place] [phone] 04/09/2012

## 2012-04-09 NOTE — Progress Notes (Signed)
Pt really weak during two person pivot chair to bed transfer and almost fell. Nurses were able to get him back in the bed. Pt was given pain meds. No immediate complains. Pt states that he is just worn out. Wife says patient woke up and complained of neck pain. Call in to Dr. Joya Gaskins who viewed patient by camera from e-link. Dr. Joya Gaskins says patient looks fine but he will have a Dr look in on him in an hour. Pt vital signs are stable. Will continue to monitor.

## 2012-04-09 NOTE — Progress Notes (Addendum)
Patient ID: Connor Small, male   DOB: [birth year], 52 y.o.   MRN: [id number] 8 Days Post-Op  Subjective: Pt more talkative.  Ostomy just exploded.  Getting him cleaned up.  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  100% mucosa visible now.  Wound just changed.  Did not remove dressing Ext: right foot with some edema.  No pain, erythema, warmth. Good pulses  Left foot with more edema than right foot.  Erythema involving the distal portion of the foot including all toes.  No pain with passive ROM, distal foot is quite warm.  Lab Results:   Recent Labs  04/08/12 0550 04/09/12 0500  WBC 3.3* 3.5*  HGB 8.0* 8.2*  HCT 24.7* 26.3*  PLT 104* 113*   BMET  Recent Labs  04/08/12 0550 04/09/12 0500  NA 149* 152*  K 3.8 3.7  CL 120* 123*  CO2 21 21  GLUCOSE 124* 113*  BUN 61* 61*  CREATININE 1.37* 1.30  CALCIUM 8.0* 8.3*   PT/INR No results found for this basename: LABPROT, INR,  in the last 72 hours CMP     Component Value Date/Time   NA 152* 04/09/2012 0500   K 3.7 04/09/2012 0500   CL 123* 04/09/2012 0500   CO2 21 04/09/2012 0500   GLUCOSE 113* 04/09/2012 0500   BUN 61* 04/09/2012 0500   CREATININE 1.30 04/09/2012 0500   CALCIUM 8.3* 04/09/2012 0500   PROT 4.0* 04/06/2012 0453   ALBUMIN 1.7* 04/06/2012 0453   AST 122* 04/06/2012 0453   ALT 161* 04/06/2012 0453   ALKPHOS 941* 04/06/2012 0453   BILITOT 6.7* 04/06/2012 0453   GFRNONAA 61* 04/09/2012 0500   GFRAA 70* 04/09/2012 0500   Lipase     Component Value Date/Time   LIPASE 34 04/02/2012 0424       Studies/Results: Dg Chest Port 1 View  04/07/2012  *RADIOLOGY REPORT*  Clinical Data: Congestion and productive cough.  PORTABLE CHEST - 1 VIEW  Comparison: 03/31/2012  Findings: Enteric tube and left central venous catheter appear unchanged in position.  There is persistent left pleural effusion with atelectasis or infiltration in the left mid and lower lungs. Changes are similar to previous study, allowing for differences in technique and patient positioning.  Right lung remains clear and expanded.  No pneumothorax.  Heart size is partially obscured by the left-sided process but appears normal.  IMPRESSION: No significant change since previous study.  Persistent left pleural effusion and basilar infiltration or atelectasis on the left.   Original Report Authenticated By: Burman Nieves, M.D.     Anti-infectives: Anti-infectives   Start     Dose/Rate Route Frequency Ordered Stop   04/01/12 1030  ciprofloxacin (CIPRO) IVPB 400 mg     400 mg 200 mL/hr over 60 Minutes Intravenous To Surgery 04/01/12 1028 04/01/12 1058   03/26/12 2100  vancomycin (VANCOCIN) IVPB 1000 mg/200 mL premix  Status:  Discontinued  1,000 mg 200 mL/hr over 60 Minutes Intravenous Every 24 hours 03/25/12 1934 03/28/12 0913   03/26/12 0945  ciprofloxacin (CIPRO) IVPB 400 mg  Status:  Discontinued     400 mg 200 mL/hr over 60 Minutes Intravenous Every 12 hours 03/26/12 0933 03/29/12 1331   03/26/12 0600  aztreonam (AZACTAM) 1 g in dextrose 5 % 50 mL IVPB  Status:  Discontinued     1 g 100 mL/hr over 30 Minutes Intravenous 3 times per day 03/25/12 1934 03/26/12 0929   03/25/12 2200  metroNIDAZOLE (FLAGYL) IVPB 500 mg  Status:  Discontinued     500 mg 100 mL/hr over 60 Minutes Intravenous Every 8 hours 03/25/12 1909 03/28/12 0913   03/25/12 2000  vancomycin (VANCOCIN) IVPB 1000 mg/200 mL  premix     1,000 mg 200 mL/hr over 60 Minutes Intravenous STAT 03/25/12 1933 03/25/12 2054   03/25/12 2000  aztreonam (AZACTAM) 1 g in dextrose 5 % 50 mL IVPB     1 g 100 mL/hr over 30 Minutes Intravenous STAT 03/25/12 1933 03/25/12 2024   03/20/12 1145  ciprofloxacin (CIPRO) IVPB 400 mg    Comments:  ON CALL TO OR   400 mg 200 mL/hr over 60 Minutes Intravenous  Once 03/20/12 1143 03/20/12 1722   03/18/12 1000  vancomycin (VANCOCIN) 1,500 mg in sodium chloride 0.9 % 500 mL IVPB  Status:  Discontinued     1,500 mg 250 mL/hr over 120 Minutes Intravenous Every 48 hours 03/17/12 1920 03/19/12 0840   03/14/12 1000  micafungin (MYCAMINE) 100 mg in sodium chloride 0.9 % 100 mL IVPB  Status:  Discontinued     100 mg 100 mL/hr over 1 Hours Intravenous Daily 03/13/12 0632 03/19/12 0840   03/13/12 1000  micafungin (MYCAMINE) 100 mg in sodium chloride 0.9 % 100 mL IVPB     100 mg 100 mL/hr over 1 Hours Intravenous Daily 03/13/12 0632 03/13/12 1311   03/12/12 1800  vancomycin (VANCOCIN) IVPB 1000 mg/200 mL premix  Status:  Discontinued     1,000 mg 200 mL/hr over 60 Minutes Intravenous Every 24 hours 03/12/12 0859 03/17/12 1017   03/12/12 1000  imipenem-cilastatin (PRIMAXIN) 500 mg in sodium chloride 0.9 % 100 mL IVPB  Status:  Discontinued     500 mg 200 mL/hr over 30 Minutes Intravenous 3 times per day 03/12/12 0848 03/12/12 0909   03/12/12 1000  imipenem-cilastatin (PRIMAXIN) 250 mg in sodium chloride 0.9 % 100 mL IVPB  Status:  Discontinued     250 mg 200 mL/hr over 30 Minutes Intravenous 4 times per day 03/12/12 0909 03/19/12 0841   03/11/12 1200  vancomycin (VANCOCIN) 750 mg in sodium chloride 0.9 % 150 mL IVPB  Status:  Discontinued     750 mg 150 mL/hr over 60 Minutes Intravenous Every 12 hours 03/11/12 0204 03/12/12 0858   03/11/12 0215  vancomycin (VANCOCIN) 750 mg in sodium chloride 0.9 % 150 mL IVPB     750 mg 150 mL/hr over 60 Minutes Intravenous  Once 03/11/12 0204 03/11/12 0352    03/11/12 0215  aztreonam (AZACTAM) 1 g in dextrose 5 % 50 mL IVPB  Status:  Discontinued     1 g 100 mL/hr over 30 Minutes Intravenous 3 times per day 03/11/12 0204 03/12/12 0851   03/05/12 2200  metroNIDAZOLE (FLAGYL) IVPB 500 mg  Status:  Discontinued    Comments:  First dose ASAP   500 mg 100 mL/hr over 60 Minutes Intravenous Every 8 hours 03/05/12  1346 03/19/12 1509   03/02/12 1200  vancomycin (VANCOCIN) 50 mg/mL oral solution 500 mg  Status:  Discontinued     500 mg Oral 4 times per day 03/02/12 0913 03/19/12 1509   03/01/12 1445  vancomycin (VANCOCIN) 50 mg/mL oral solution 500 mg  Status:  Discontinued     500 mg Oral 3 times per day 03/01/12 1340 03/02/12 0913   03/01/12 1400  aztreonam (AZACTAM) 1 g in dextrose 5 % 50 mL IVPB  Status:  Discontinued     1 g 100 mL/hr over 30 Minutes Intravenous 3 times per day 03/01/12 1356 03/02/12 1146   03/01/12 1300  vancomycin (VANCOCIN) 1,250 mg in sodium chloride 0.9 % 250 mL IVPB  Status:  Discontinued     1,250 mg 166.7 mL/hr over 90 Minutes Intravenous Every 12 hours 03/01/12 1127 03/02/12 1146   02/29/12 0600  ciprofloxacin (CIPRO) IVPB 400 mg     400 mg 200 mL/hr over 60 Minutes Intravenous On call to O.R. 02/28/12 1409 02/28/12 1438   02/28/12 2200  vancomycin (VANCOCIN) 500 mg in sodium chloride irrigation 0.9 % 60 mL ENEMA  Status:  Discontinued     500 mg Rectal 2 times daily 02/28/12 1504 02/28/12 1817   02/28/12 1200  vancomycin (VANCOCIN) 50 mg/mL oral solution 500 mg  Status:  Discontinued    Comments:  First dose ASAP   500 mg Per Tube 4 times per day 02/28/12 0814 02/28/12 1817   02/28/12 1000  vancomycin (VANCOCIN) IVPB 1000 mg/200 mL premix  Status:  Discontinued     1,000 mg 200 mL/hr over 60 Minutes Intravenous Every 12 hours 02/28/12 0845 03/01/12 1127   02/28/12 1000  vancomycin (VANCOCIN) 500 mg in sodium chloride irrigation 0.9 % 100 mL ENEMA  Status:  Discontinued     500 mg Rectal 2 times daily 02/28/12 0923  02/28/12 1504   02/27/12 1600  metroNIDAZOLE (FLAGYL) IVPB 500 mg  Status:  Discontinued     500 mg 100 mL/hr over 60 Minutes Intravenous Every 8 hours 02/27/12 1546 02/27/12 2000   02/27/12 1600  ciprofloxacin (CIPRO) IVPB 400 mg  Status:  Discontinued     400 mg 200 mL/hr over 60 Minutes Intravenous Every 12 hours 02/27/12 1547 02/27/12 2000   02/27/12 1545  metroNIDAZOLE (FLAGYL) IVPB 500 mg  Status:  Discontinued     500 mg 100 mL/hr over 60 Minutes Intravenous  Once 02/27/12 1535 02/28/12 0843   02/27/12 1545  vancomycin (VANCOCIN) 500 mg in sodium chloride 0.9 % 100 mL IVPB  Status:  Discontinued     500 mg 100 mL/hr over 60 Minutes Intravenous  Once 02/27/12 1535 02/27/12 1538   02/27/12 1545  ciprofloxacin (CIPRO) IVPB 400 mg  Status:  Discontinued     400 mg 200 mL/hr over 60 Minutes Intravenous  Once 02/27/12 1538 02/28/12 0843   02/27/12 1415  vancomycin (VANCOCIN) 500 mg in sodium chloride 0.9 % 100 mL IVPB     500 mg 100 mL/hr over 60 Minutes Intravenous  Once 02/27/12 1402 02/27/12 1411   02/27/12 1400  vancomycin (VANCOCIN) powder 500 mg  Status:  Discontinued     500 mg Other To Surgery 02/27/12 1359 02/27/12 1402   02/27/12 1200  metroNIDAZOLE (FLAGYL) IVPB 500 mg  Status:  Discontinued    Comments:  First dose ASAP   500 mg 100 mL/hr over 60 Minutes Intravenous Every 6 hours 02/27/12 0953 03/05/12 1346   02/27/12 1200  vancomycin (  VANCOCIN) 50 mg/mL oral solution 250 mg  Status:  Discontinued    Comments:  First dose ASAP   250 mg Oral 4 times per day 02/27/12 0953 02/28/12 0814   02/26/12 1700  oseltamivir (TAMIFLU) capsule 75 mg     75 mg Oral  Once 02/26/12 1651 02/26/12 1715       Assessment/Plan  1. S/p open abdomen, now closed 2. PCM/TF  Plan: 1. Cont dressing changes to abdomen. 2. Once he passes swallow study, he may eat from our standpoint 3 will defer new findings on left foot to primary service.   LOS: 43 days    OSBORNE,KELLY E 04/09/2012,  8:05 AM Pager: [phone]  No evidence of ongoing bleeding.  HD stable. (this was entered on the wrong patient's chart)  He seems to be improving.  Wound looks good.  Tolerating tube feeds. Continue rehab and evaluation and preparation for SNF

## 2012-04-09 NOTE — Progress Notes (Signed)
Occupational Therapy Treatment Patient Details Name: Connor Small MRN: [id number] DOB: 1960-08-03 Today's Date: 04/09/2012 Time: 0102-7253 OT Time Calculation (min): 56 min  OT Assessment / Plan / Recommendation Comments on Treatment Session Excellent session. Increaseing trunk strength/postural control. Able to sit for short periods EOB with min A.  LUE increasing in strength - weaker proximally. Functional grasp now. Working with theraputty.  RUE scapular strength improving. Continues to demonstrate increased weakness with shoulder flexion, elbow flexion, supination/pronation, wrist and finger extension, IP flexion and thumb extension and abduction. Weakness consistent with myopathy/polyneuopathy, in addition to symptoms consistent with brachial plexus dysfunction. Pt placed in R wrist cock up splint today to support wrist. Pt to wear throughout the day with splint checks q 2 hrs initially. will conitue to follow. continue to recommned CIR as venue for D/C     Follow Up Recommendations  CIR    Barriers to Discharge       Equipment Recommendations  3 in 1 bedside comode;Tub/shower bench    Recommendations for Other Services Rehab consult  Frequency Min 3X/week   Plan Discharge plan remains appropriate    Precautions / Restrictions Precautions Precautions: Fall Precaution Comments: Abd binder, ostomy, panda (subluxed shoulders) Required Braces or Orthoses: Other Brace/Splint Other Brace/Splint: R wrist cock up slint   Pertinent Vitals/Pain Vitals stable. C/o pain L foot with weight bearing    ADL  ADL Comments: Co treat with PT with focus on postural control sitting EOB. Total A +2 for bed mobility. Initially requires more assist. Pt able to sit EOB  propping with BUE with min A. facilitated lateral weight shifts in sitting as precursor to mobility. Scapular strengthening in sitting unsupported . Able to bring head to midline and maintain erect posture with vc and occasional min A.      OT Diagnosis:    OT Problem List:   OT Treatment Interventions:     OT Goals Acute Rehab OT Goals OT Goal Formulation: With patient Time For Goal Achievement: 04/16/12 Potential to Achieve Goals: Good ADL Goals Pt Will Perform Grooming: with mod assist;Sitting, chair;Supported;with cueing (comment type and amount) ADL Goal: Grooming - Progress: Progressing toward goals Pt Will Perform Upper Body Bathing: with mod assist;Sitting, chair;Supported;with cueing (comment type and amount) ADL Goal: Upper Body Bathing - Progress: Progressing toward goals Additional ADL Goal #1: Pt will complete bed mobility with mod A in preparation for ADL.  ADL Goal: Additional Goal #1 - Progress: Progressing toward goals Additional ADL Goal #2: Pt will sit EOB with min A supported in preparation for ADL ADL Goal: Additional Goal #2 - Progress: Progressing toward goals Arm Goals Pt Will Complete Theraputty Exer: to increase strength;Bilateral upper extremities;with minimal assist;Other (comment) Arm Goal: Theraputty Exercises - Progress: Progressing toward goal Additional Arm Goal #1: Wife will complete P/AAROM BUE with S Arm Goal: Additional Goal #1 - Progress: Progressing toward goals Additional Arm Goal #2: complete PNF D1/D2 patterns with LUE in preparation for ADL with min A in supine. Arm Goal: Additional Goal #2 - Progress: Progressing toward goals Miscellaneous OT Goals Miscellaneous OT Goal #1: Wife will complete edema control techniques with S R hand OT Goal: Miscellaneous Goal #1 - Progress: Met Miscellaneous OT Goal #2: Demonstrate functional grasp/release R hand with use of orthotic with min A  OT Goal: Miscellaneous Goal #2 - Progress: Progressing toward goals  Visit Information  Last OT Received On: 04/09/12 Assistance Needed: +2    Subjective Data      Prior  Functioning       Cognition  Cognition Overall Cognitive Status: Impaired Area of Impairment: Attention Difficult to  assess due to: Impaired communication Arousal/Alertness: Awake/alert    Mobility  Bed Mobility Bed Mobility: Rolling Left;Left Sidelying to Sit Rolling Right: 1: +2 Total assist Rolling Right: Patient Percentage: 10% Transfers Details for Transfer Assistance: Bobath technique to pivot bed to chair with use of pad and belt  with bil LE blocked for transfer (+2)    Exercises  General Exercises - Upper Extremity Shoulder Flexion: Both;10 reps;AAROM;Seated Shoulder ABduction: Both;10 reps;Strengthening;AAROM;Seated Elbow Flexion: Both;10 reps;Seated Elbow Extension: Both;10 reps;Seated Wrist Flexion: Both;10 reps;Seated Wrist Extension: Left;10 reps;Seated Digit Composite Flexion: Both;Seated;Squeeze ball Composite Extension: Both;Seated Other Exercises Other Exercises: Scapular strngthening all planes Other Exercises: theraputty exercises - soft/tan Other Exercises: PNF ex B UE D1/D2 patterns seated Other Exercises: squeeze ball - both   Balance Static Sitting Balance Static Sitting - Balance Support: Bilateral upper extremity supported;Feet supported Static Sitting - Level of Assistance: 3: Mod assist;4: Min assist (fluxuated from min - mod A) Static Sitting - Comment/# of Minutes: 20   End of Session OT - End of Session Equipment Utilized During Treatment: Other (comment) (binder) Activity Tolerance: Patient tolerated treatment well Patient left: in chair;with call bell/phone within reach;with nursing in room;with family/visitor present Nurse Communication: Mobility status;Other (comment) (need to support B elbows. splint wearing schedule)  GO     Aiza Vollrath,HILLARY 04/09/2012, 12:51 PM [facility], OTR/L  [phone] 04/09/2012

## 2012-04-09 NOTE — Progress Notes (Signed)
Indigestion   Maalox ordered

## 2012-04-10 DIAGNOSIS — R5381 Other malaise: Secondary | ICD-10-CM

## 2012-04-10 DIAGNOSIS — J9600 Acute respiratory failure, unspecified whether with hypoxia or hypercapnia: Secondary | ICD-10-CM

## 2012-04-10 DIAGNOSIS — A0472 Enterocolitis due to Clostridium difficile, not specified as recurrent: Secondary | ICD-10-CM

## 2012-04-10 DIAGNOSIS — R300 Dysuria: Secondary | ICD-10-CM

## 2012-04-10 LAB — CBC
HCT: 28.1 % — ABNORMAL LOW (ref 39.0–52.0)
Hemoglobin: 8.7 g/dL — ABNORMAL LOW (ref 13.0–17.0)
MCH: 31.8 pg (ref 26.0–34.0)
MCHC: 31 g/dL (ref 30.0–36.0)
RBC: 2.74 MIL/uL — ABNORMAL LOW (ref 4.22–5.81)

## 2012-04-10 LAB — GLUCOSE, CAPILLARY
Glucose-Capillary: 103 mg/dL — ABNORMAL HIGH (ref 70–99)
Glucose-Capillary: 113 mg/dL — ABNORMAL HIGH (ref 70–99)

## 2012-04-10 LAB — MAGNESIUM: Magnesium: 1.9 mg/dL (ref 1.5–2.5)

## 2012-04-10 LAB — BASIC METABOLIC PANEL
BUN: 60 mg/dL — ABNORMAL HIGH (ref 6–23)
Creatinine, Ser: 1.27 mg/dL (ref 0.50–1.35)
GFR calc Af Amer: 72 mL/min — ABNORMAL LOW (ref >90)
GFR calc non Af Amer: 62 mL/min — ABNORMAL LOW (ref >90)
Glucose, Bld: 117 mg/dL — ABNORMAL HIGH (ref 70–99)
Potassium: 4.2 mEq/L (ref 3.5–5.1)

## 2012-04-10 LAB — PRO B NATRIURETIC PEPTIDE: Pro B Natriuretic peptide (BNP): 239.6 pg/mL — ABNORMAL HIGH (ref 0–125)

## 2012-04-10 LAB — HEPATIC FUNCTION PANEL
ALT: 90 U/L — ABNORMAL HIGH (ref 0–53)
AST: 48 U/L — ABNORMAL HIGH (ref 0–37)
Albumin: 1.7 g/dL — ABNORMAL LOW (ref 3.5–5.2)
Alkaline Phosphatase: 695 U/L — ABNORMAL HIGH (ref 39–117)
Bilirubin, Direct: 2 mg/dL — ABNORMAL HIGH (ref 0.0–0.3)
Total Bilirubin: 3.3 mg/dL — ABNORMAL HIGH (ref 0.3–1.2)

## 2012-04-10 MED ORDER — FUROSEMIDE 10 MG/ML IJ SOLN
INTRAMUSCULAR | Status: AC
  Filled 2012-04-10: qty 4

## 2012-04-10 MED ORDER — FREE WATER
300.0000 mL | Freq: Four times a day (QID) | Status: DC
  Administered 2012-04-10 – 2012-04-15 (×16): 300 mL
  Administered 2012-04-16: 150 mL

## 2012-04-10 MED ORDER — OXYCODONE HCL 5 MG/5ML PO SOLN
5.0000 mg | Freq: Four times a day (QID) | ORAL | Status: DC
  Administered 2012-04-10 – 2012-04-11 (×4): 5 mg via ORAL
  Filled 2012-04-10 (×4): qty 5

## 2012-04-10 MED ORDER — FUROSEMIDE 10 MG/ML IJ SOLN
20.0000 mg | Freq: Three times a day (TID) | INTRAMUSCULAR | Status: AC
  Administered 2012-04-10 (×2): 20 mg via INTRAVENOUS
  Filled 2012-04-10: qty 2

## 2012-04-10 MED ORDER — MAGNESIUM SULFATE 40 MG/ML IJ SOLN
2.0000 g | Freq: Once | INTRAMUSCULAR | Status: AC
  Administered 2012-04-10: 2 g via INTRAVENOUS
  Filled 2012-04-10: qty 50

## 2012-04-10 NOTE — Progress Notes (Signed)
Speech Language Pathology Dysphagia Treatment Patient Details Name: Connor Small MRN: [id number] DOB: [birth year] Today's Date: 04/10/2012 Time: 9977-4142 SLP Time Calculation (min): 30 min  Assessment / Plan / Recommendation Clinical Impression  Pt again seen for pharyngeal strengthening exercises as tolerated. Pain better managed today at time of session. Pt more able to follow instructions, complete exercises. SLP offered minimal tastes of pudding thick liquids with pt enjoyed, but continued to exhibit frequent throat clears. Will repeat FEES tomorrow morning; hopeful that pt may initate limited pudding/purees during the day to battle disuse of swallow mechanism. Surgery Ok'd MBS, but agree with wife that the transfer associated with MBS may wear the pt out given his limited endurance and anxiety. Will pan on FEES tomorrow am.     Diet Recommendation  Continue with Current Diet: NPO    SLP Plan FEES   Pertinent Vitals/Pain NA   Swallowing Goals  SLP Swallowing Goals Goal #3: Pt will complete base of tongue/pharyngeal/laryngeal strengthening exercises x10 each with moderate verbal cues.  Swallow Study Goal #3 - Progress: Progressing toward goal  General Temperature Spikes Noted: No Respiratory Status: Room air Behavior/Cognition: Alert;Cooperative;Pleasant mood Oral Cavity - Dentition: Adequate natural dentition Patient Positioning: Upright in chair  Oral Cavity - Oral Hygiene     Dysphagia Treatment Treatment focused on: Facilitation of pharyngeal phase Family/Caregiver Educated: wife Treatment Methods/Modalities: Effortful swallow;Masako Maneuver;Mendelsohn Maneuver Patient observed directly with PO's: Yes Type of PO's observed: Ice chips;Pudding-thick liquids Feeding: Total assist Liquids provided via: Teaspoon Oral Phase Signs & Symptoms: Prolonged oral phase;Prolonged bolus formation Pharyngeal Phase Signs & Symptoms: Immediate throat clear Type of cueing:  Verbal;Tactile;Visual Amount of cueing: Minimal   GO    [name], MA CCC-SLP [phone]  [name] 04/10/2012, 3:50 PM

## 2012-04-10 NOTE — Progress Notes (Signed)
Physical Therapy Treatment Patient Details Name: Connor Small MRN: [id number] DOB: [birth year] Today's Date: 04/10/2012 Time: 1120-1210 PT Time Calculation (min): 50 min  PT Assessment / Plan / Recommendation Comments on Treatment Session  Patient is improved with sitting balance and tolerance today, but still very limited with participation with transfers.  Wife states near fall yesterday when nursing assisted him back to bed.  Still needs lift equipment for safety.      Follow Up Recommendations  CIR     Does the patient have the potential to tolerate intense rehabilitation   yes  Barriers to Discharge  None      Equipment Recommendations  None recommended by PT       Frequency Min 3X/week   Plan Discharge plan remains appropriate    Precautions / Restrictions Precautions Precautions: Fall Precaution Comments: Abd binder, ostomy, panda Required Braces or Orthoses: Other Brace/Splint Other Brace/Splint: R wrist cock up slint   Pertinent Vitals/Pain C/o min abdominal pain with activity.  HR max 128    Mobility  Bed Mobility Rolling Right: 1: +2 Total assist Rolling Right: Patient Percentage: 10% Rolling Left: 1: +2 Total assist Rolling Left: Patient Percentage: 10% Left Sidelying to Sit: 1: +2 Total assist Left Sidelying to Sit: Patient Percentage: 10% Details for Bed Mobility Assistance: required multimodal cues for segmental participation in mobility tasks Transfers Transfers: Lateral/Scoot Transfers Lateral/Scoot Transfers: 1: +2 Total assist Lateral Transfers: Patient Percentage: 10% Details for Transfer Assistance: scooted to higher seated position in bed to get off hump at bottom of bed with +1 assist, to chair leaning forward and scooting pt on pad underneath.  To chair with +2, pt minimally participative with transfer, but chair scooting away from bed so needed increased assist to keep pt safe.      Exercises Low Level/ICU Exercises Hip Extension:  Strengthening;Right;Left;5 reps;Seated (hip and knee resisted extension) Other Exercises Other Exercises: head press into chair x 5 with 3 seconds hold     PT Goals Acute Rehab PT Goals Pt will go Supine/Side to Sit: with +1 total assist PT Goal: Supine/Side to Sit - Progress: Progressing toward goal Pt will Sit at [facility] of Bed: 6-10 min;with unilateral upper extremity support;with min assist PT Goal: Sit at Edge Of Bed - Progress: Progressing toward goal Pt will Transfer Bed to Chair/Chair to Bed: with +1 total assist PT Transfer Goal: Bed to Chair/Chair to Bed - Progress: Progressing toward goal Pt will Perform Home Exercise Program: with min assist PT Goal: Perform Home Exercise Program - Progress: Progressing toward goal  Visit Information  Last PT Received On: 04/10/12    Subjective Data  Subjective: Please give me a break.   Cognition  Cognition Overall Cognitive Status: Impaired Area of Impairment: Attention Arousal/Alertness: Awake/alert Current Attention Level: Sustained Attention - Other Comments: needs step by step instructions to increase participation Following Commands: Follows one step commands with increased time    Balance  Static Sitting Balance Static Sitting - Balance Support: Left upper extremity supported;Feet supported Static Sitting - Level of Assistance: 4: Min assist;3: Mod assist Static Sitting - Comment/# of Minutes: 10 while sitting encouraged cervical AROM and to maintain increased head upright  End of Session PT - End of Session Equipment Utilized During Treatment: Other (comment) (abdominal binder) Activity Tolerance: Patient limited by fatigue Patient left: in chair;with call bell/phone within reach   GP     [facility] 04/10/2012, 12:46 PM [place], [place] 04/10/2012

## 2012-04-10 NOTE — Consult Note (Signed)
Physical Medicine and Rehabilitation Consult Reason for Consult: Deconditioning/multi-medical Referring Physician: Critical care   HPI: Connor Small is a 52 y.o. right-handed male admitted 02/26/2012 with low back pain, fever 102.5 and multiple episodes of watery diarrhea. Patient had been exposed to a coworker with similar illness. He was vaccinated for influenza in October. Patient noted myalgias, sore throat and nausea with poor appetite. CT of the abdomen showed no evidence of bowel obstruction with noted prior distal colonic resection with re\re anastomosis and noted history of ulcerated colitis. Underwent flexible sigmoidoscopy 02/27/2012 per Dr.Rehman with findings of multiple pseudomembranes consistent with C. difficile colitis as well as low colorectal stricture. Underwent exploratory laparotomy,cecostomy tube placement per Dr. Bernette Mayers 02/27/2012 as well as total abdominal colectomy with an ileostomy and intra-abdominal drain placement. Postoperatively he developed ARDS and was intubated 02/28/2012 at 03/15/2012 and sepsis transferred to the care critical services as well his new onset atrial fibrillation. Patient maintained on broad-spectrum antibiotics. Hospital course with acute renal failure with renal services consulted 03/12/2012 and creatinine 2.74 likely secondary to ischemic ATN in light of intravascular volume depletion and required CVVHD from 03/12/2012 at 03/15/2012. Patient developed DVT involving right basilic vein after PICC line PICC line had been on argatoban and monitoring of platelets. Patient with intermittent bouts of altered mental status as well as global weakness. Neurology services consulted MRI of the brain showed scattered small acute to subacute infarcts in both cerebral hemispheres, brainstem and right cerebellum. Patient with complaints of right shoulder pain x-rays had revealed inferior subluxation consult obtain orthopedic services Dr.Yates and not felt to be  dislocated. Shoulder was reduced with good passive range of motion and advised to continue occupational therapy. Followup speech therapy for dysphagia and noted overt evidence of aspiration following pudding thick liquids patient presently remains n.p.o. and question plan for modified barium swallow. Physical and occupational therapy ongoing patient noted severe deconditioning as well as weakness after CVA and recommendations are made for physical medicine rehabilitation consult to consider inpatient rehabilitation services.   Review of Systems  Constitutional: Positive for fever and chills.  HENT: Positive for sore throat.   Gastrointestinal: Positive for diarrhea.  Musculoskeletal: Positive for myalgias.   Past Medical History  Diagnosis Date  . Chronic diarrhea   . Rectal bleed   . Hemorrhoids   . Ulcerative colitis     Distal UC over 8 yrs ago diagnosed  . Diverticulitis of large intestine with perforation 10/2011    done at [place]   Past Surgical History  Procedure Laterality Date  . Colonoscopy  9/03  . Sigmoidoscopy       06/13/2002  . Temporary ostomy November 06, 2010      for a colon perforation that was done in [facility] (Dr Payton Doughty).    . Colonoscopy  02/16/2011    Procedure: COLONOSCOPY;  Surgeon: Rogene Houston, MD;  [facility]: [facility];  Service: Endoscopy;  Laterality: N/A;  100  . Flexible sigmoidoscopy  02/27/2012    Procedure: FLEXIBLE SIGMOIDOSCOPY;  Surgeon: Rogene Houston, MD;  [facility]: [facility];  Service: Endoscopy;  Laterality: N/A;  with colonic decompression  . Laparotomy  02/27/2012    Procedure: EXPLORATORY LAPAROTOMY;  Surgeon: Donato Heinz, MD;  Location: [facility];  Service: General;  Laterality: N/A;  . Cecostomy  02/27/2012    Procedure: CECOSTOMY;  Surgeon: Donato Heinz, MD;  Location: [facility];  Service: General;  Laterality: N/A;  Cecostomy Tube Placement  . Colectomy  02/28/2012    Procedure: TOTAL COLECTOMY;  Surgeon: Donato Heinz,  MD;  Location: [facility];  Service: General;  Laterality: N/A;  . Ileostomy  02/28/2012    Procedure: ILEOSTOMY;  Surgeon: Donato Heinz, MD;  Location: [facility];  Service: General;  Laterality: N/A;  . Cystoscopy w/ ureteral stent placement Bilateral 03/20/2012    Procedure: CYSTOSCOPY WITH RETROGRADE PYELOGRAM/URETERAL STENT PLACEMENT;  Surgeon: Alexis Frock, MD;  Location: [place];  Service: Urology;  Laterality: Bilateral;  . Laparotomy N/A 03/26/2012    Procedure: EXPLORATORY LAPAROTOMY;  Surgeon: Edward Jolly, MD;  Location: [place];  Service: General;  Laterality: N/A;  . Application of wound vac N/A 03/26/2012    Procedure: APPLICATION OF WOUND VAC;  Surgeon: Edward Jolly, MD;  Location: [facility];  Service: General;  Laterality: N/A;  . Liver biopsy N/A 03/26/2012    Procedure: LIVER BIOPSY;  Surgeon: Edward Jolly, MD;  Location: [place];  Service: General;  Laterality: N/A;  . Laparotomy N/A 03/29/2012    Procedure: EXPLORATORY LAPAROTOMY, PARTIAL WOUND CLOSURE;  Surgeon: Edward Jolly, MD;  Location: [place];  Service: General;  Laterality: N/A;  . Vacuum assisted closure change N/A 03/29/2012    Procedure: Open ABDOMINAL VACUUM  CHANGE;  Surgeon: Edward Jolly, MD;  Location: [place];  Service: General;  Laterality: N/A;  . Vacuum assisted closure change N/A 04/01/2012    Procedure: removal of abdominal vac dressing and abdominal closure;  Surgeon: Edward Jolly, MD;  Location[facility];  Service: General;  Laterality: N/A;   Family History  Problem Relation Age of Onset  . Cancer Sister   . Healthy Daughter   . Hypertension Mother    Social History:  reports that he quit smoking about 27 years ago. His smoking use included Cigarettes. He smoked 0.00 packs per day. He has never used smokeless tobacco. He reports that he does not drink alcohol or use illicit drugs. Allergies:  Allergies  Allergen Reactions  . Penicillins Other (See Comments)    Heart rate changes  .  Imipenem Rash   Medications Prior to Admission  Medication Sig Dispense Refill  . bifidobacterium infantis (ALIGN) capsule Take 1 capsule by mouth daily.  14 capsule  0  . FORTESTA 10 MG/ACT (2%) GEL at bedtime. Patient states that he applies it to his leg      . ibuprofen (ADVIL,MOTRIN) 200 MG tablet Take 400 mg by mouth 2 (two) times daily as needed. For pain      . Mesalamine (ASACOL HD) 800 MG TBEC Take 2 tablets (1,600 mg total) by mouth 2 (two) times daily.  180 tablet  3  . Pseudoeph-Doxylamine-DM-APAP (NYQUIL D COLD/FLU) 60-12.06-22-998 MG/30ML LIQD Take 30 mLs by mouth at bedtime. Cold/flu symptoms      . Pseudoephedrine-APAP-DM (DAYQUIL MULTI-SYMPTOM COLD/FLU PO) Take 1 capsule by mouth every 4 (four) hours as needed. Cold symptoms        Home: Home Living Lives With: Spouse Available Help at Discharge: Family Type of Home: House Additional Comments: family not available for further quextions  Functional History:   Functional Status:  Mobility: Bed Mobility Bed Mobility: Rolling Left;Left Sidelying to Sit Rolling Right: 1: +2 Total assist Rolling Right: Patient Percentage: 10% Rolling Left: 1: +2 Total assist Rolling Left: Patient Percentage: 10% Left Sidelying to Sit: 1: +2 Total assist Left Sidelying to Sit: Patient Percentage: 10% Sitting - Scoot to Edge of Bed: 1: +2 Total assist Sitting -  Scoot to [employer] of Bed: Patient Percentage: 0% Sit to Supine: Not Tested (comment) Sit to Supine: Patient Percentage: 0% Transfers Transfers: Not assessed Sit to Stand: 1: +2 Total assist;With upper extremity assist;From bed Sit to Stand: Patient Percentage: 30% Stand to Sit: 1: +2 Total assist;To chair/3-in-1;Without upper extremity assist Stand to Sit: Patient Percentage: 30% Squat Pivot Transfers: 1: +2 Total assist Squat Pivot Transfers: Patient Percentage: 0% Ambulation/Gait Ambulation/Gait Assistance: Not tested (comment) Stairs: No Wheelchair Mobility Wheelchair  Mobility: No  ADL: ADL Eating/Feeding: NPO Grooming: +1 Total assistance Upper Body Bathing: +1 Total assistance Lower Body Bathing: +1 Total assistance Upper Body Dressing: +1 Total assistance Lower Body Dressing: +1 Total assistance Toilet Transfer: +2 Total assistance;Simulated Transfers/Ambulation Related to ADLs: not tested. ADL Comments: Follow up for splint check. Pt tolerating without c/o. Sheet placed above bed.  Cognition: Cognition Arousal/Alertness: Awake/alert Orientation Level: Oriented X4 Cognition Overall Cognitive Status: Impaired Area of Impairment: Attention Difficult to assess due to: Impaired communication Arousal/Alertness: Awake/alert Orientation Level: Disoriented to;Time;Place Behavior During Session: Flat affect Current Attention Level: Sustained Attention - Other Comments: still needs frequent redirection Following Commands: Follows one step commands with increased time Cognition - Other Comments: Educated family to turn off TV when working on exercise. Only 1 person talk at a time.  Blood pressure 97/57, pulse 106, temperature 98.8 F (37.1 C), temperature source Oral, resp. rate 15, height 5' 9"  (1.753 m), weight 84.1 kg (185 lb 6.5 oz), SpO2 98.00%. Physical Exam  Vitals reviewed. Constitutional:  52 year old male with nasogastric tube in place  HENT:  Head: Normocephalic.  Eyes:  Pupils round and reactive to light  Neck: Neck supple. No thyromegaly present.  Cardiovascular:  Cardiac rate controlled  Pulmonary/Chest: Breath sounds normal. He has no wheezes.  Abdominal: Bowel sounds are normal. He exhibits no distension. There is no tenderness.  Neurological: He is alert.  Patient with flat affect was able to state his name and follow simple commands. LUE is 3+/5 RUE is 1-2 prox to distal. BLE 1+ HF and KE, 3/5 APF and 2/5 ADF. Perhaps mild sensory loss over feet.  Skin:  Surgical sites dressed and healing  Psychiatric: He has a normal  mood and affect. His behavior is normal. Judgment and thought content normal.    Results for orders placed during the hospital encounter of 02/26/12 (from the past 24 hour(s))  GLUCOSE, CAPILLARY     Status: Abnormal   Collection Time    04/09/12  7:26 PM      Result Value Range   Glucose-Capillary 116 (*) 70 - 99 mg/dL   Ct Cervical Spine Wo Contrast  04/09/2012  *RADIOLOGY REPORT*  Clinical Data: Diffuse neck pain after fall.  CT CERVICAL SPINE WITHOUT CONTRAST  Technique:  Multidetector CT imaging of the cervical spine was performed. Multiplanar CT image reconstructions were also generated.  Comparison: MRI brain 03/22/2012.  CT head 03/11/2012.  Findings: Mild degenerative changes in the cervical spine with narrowed interspaces at C3-4, C4-5, C5-6, and C6-7 levels.  There are associated endplate hypertrophic changes.  Mild reversal of the usual cervical lordosis which is likely due to the degenerative changes.  Degenerative changes in the facet joints.  No vertebral compression deformities.  No prevertebral soft tissue swelling.  No focal bone lesion or bone destruction.  There is subcutaneous emphysema predominately in the left neck involving the sternocleidomastoid muscles and extending across the midline over the laryngeal region.  There is some dissection around the jugular vein.  There  is a left central venous catheter in place in this emphysema may be related catheter insertion.  The visualized lung apices are limited due to motion artifact but there is no obvious pneumothorax.  There is a large left pleural effusion.  IMPRESSION: Degenerative changes in the cervical spine.  No displaced fractures are identified.  Subcutaneous emphysema in the left neck which may be related to a central venous catheter insertion.  A large left pleural effusion is identified.   Original Report Authenticated By: Lucienne Capers, M.D.    Dg Chest Port 1 View  04/09/2012  *RADIOLOGY REPORT*  Clinical Data:  Right-sided pain after fall.  PORTABLE CHEST - 1 VIEW  Comparison: 04/07/2012  Findings: Enteric tube remains in place.  Left central venous catheter is unchanged in position.  Persistent left pleural effusion with infiltration or atelectasis in the left base.  This appears stable since previous study.  Minimal infiltration in the right lung bases somewhat improved.  No pneumothorax.  IMPRESSION: Stable appearance of left pleural effusion with infiltration or atelectasis in the left base.  Mild improvement of infiltration in the right base.   Original Report Authenticated By: Lucienne Capers, M.D.     Assessment/Plan: Diagnosis: severe deconditioning after c diff colitis/colectomy with subsequent embolic cva 1. Does the need for close, 24 hr/day medical supervision in concert with the patient's rehab needs make it unreasonable for this patient to be served in a less intensive setting? Yes 2. Co-Morbidities requiring supervision/potential complications: multiple, see above 3. Due to bladder management, bowel management, safety, skin/wound care, disease management, medication administration, pain management and patient education, does the patient require 24 hr/day rehab nursing? Yes 4. Does the patient require coordinated care of a physician, rehab nurse, PT (1-2 hrs/day, 5 days/week), OT (1-2 hrs/day, 5 days/week) and SLP (1-2 hrs/day, 5 days/week) to address physical and functional deficits in the context of the above medical diagnosis(es)? Yes Addressing deficits in the following areas: balance, endurance, locomotion, strength, transferring, bowel/bladder control, bathing, dressing, feeding, grooming, toileting, cognition, speech, language, swallowing and psychosocial support 5. Can the patient actively participate in an intensive therapy program of at least 3 hrs of therapy per day at least 5 days per week? Yes 6. The potential for patient to make measurable gains while on inpatient rehab is  excellent 7. Anticipated functional outcomes upon discharge from inpatient rehab are min to mod assist with PT, min to mod assist with OT, mod I to supervision with SLP. 8. Estimated rehab length of stay to reach the above functional goals is: 3-4 weeks 9. Does the patient have adequate social supports to accommodate these discharge functional goals? Yes 10. Anticipated D/C setting: Home 11. Anticipated post D/C treatments: [place] therapy 12. Overall Rehab/Functional Prognosis: excellent  RECOMMENDATIONS: This patient's condition is appropriate for continued rehabilitative care in the following setting: CIR Patient has agreed to participate in recommended program. Yes Note that insurance prior authorization may be required for reimbursement for recommended care.  Comment: Will follow for increased activity tolerance and medical stability for CIR.  Meredith Staggers, MD, Mellody Drown     04/10/2012

## 2012-04-10 NOTE — Progress Notes (Signed)
Patient ID: Connor Small, male   DOB: 05/19/1960, 52 y.o.   MRN: [id number] 9 Days Post-Op  Subjective: Events from last night seen.  CT of neck negative.  Failed swallow study.  In room when patient voided this morning.  Had difficulty starting and stopping his stream along with significant dysuria.  Objective: Vital signs in last 24 hours: Temp:  [97.4 F (36.3 C)-98.8 F (37.1 C)] 97.4 F (36.3 C) (03/18 0741) Pulse Rate:  [95-131] 95 (03/18 0741) Resp:  [12-26] 13 (03/18 0741) BP: (97-123)/(57-85) 105/64 mmHg (03/18 0741) SpO2:  [96 %-100 %] 100 % (03/18 0741) Weight:  [185 lb 6.5 oz (84.1 kg)] 185 lb 6.5 oz (84.1 kg) (03/17 2332) Last BM Date: 04/09/12  Intake/Output from previous day: 03/17 0701 - 03/18 0700 In: 2645 [I.V.:400; NG/GT:2245] Out: 1125 [Urine:1125] Intake/Output this shift:    PE: Abd: soft, wound is healing well.  Some fibrin at base of wound, but sutures and retention sutures in place.  Ostomy functioning well.  Lab Results:   Recent Labs  04/09/12 0500 04/10/12 0650  WBC 3.5* 4.1  HGB 8.2* 8.7*  HCT 26.3* 28.1*  PLT 113* 120*   BMET  Recent Labs  04/09/12 0500 04/10/12 0650  NA 152* 154*  K 3.7 4.2  CL 123* 125*  CO2 21 21  GLUCOSE 113* 117*  BUN 61* 60*  CREATININE 1.30 1.27  CALCIUM 8.3* 8.5   PT/INR  Recent Labs  04/10/12 0650  LABPROT 13.7  INR 1.06   CMP     Component Value Date/Time   NA 154* 04/10/2012 0650   K 4.2 04/10/2012 0650   CL 125* 04/10/2012 0650   CO2 21 04/10/2012 0650   GLUCOSE 117* 04/10/2012 0650   BUN 60* 04/10/2012 0650   CREATININE 1.27 04/10/2012 0650   CALCIUM 8.5 04/10/2012 0650   PROT 4.9* 04/10/2012 0650   ALBUMIN 1.7* 04/10/2012 0650   AST 48* 04/10/2012 0650   ALT 90* 04/10/2012 0650   ALKPHOS 695* 04/10/2012 0650   BILITOT 3.3* 04/10/2012 0650   GFRNONAA 62* 04/10/2012 0650   GFRAA 72* 04/10/2012 0650   Lipase     Component Value Date/Time   LIPASE 34 04/02/2012 0424        Studies/Results: Ct Cervical Spine Wo Contrast  04/09/2012  *RADIOLOGY REPORT*  Clinical Data: Diffuse neck pain after fall.  CT CERVICAL SPINE WITHOUT CONTRAST  Technique:  Multidetector CT imaging of the cervical spine was performed. Multiplanar CT image reconstructions were also generated.  Comparison: MRI brain 03/22/2012.  CT head 03/11/2012.  Findings: Mild degenerative changes in the cervical spine with narrowed interspaces at C3-4, C4-5, C5-6, and C6-7 levels.  There are associated endplate hypertrophic changes.  Mild reversal of the usual cervical lordosis which is likely due to the degenerative changes.  Degenerative changes in the facet joints.  No vertebral compression deformities.  No prevertebral soft tissue swelling.  No focal bone lesion or bone destruction.  There is subcutaneous emphysema predominately in the left neck involving the sternocleidomastoid muscles and extending across the midline over the laryngeal region.  There is some dissection around the jugular vein.  There is a left central venous catheter in place in this emphysema may be related catheter insertion.  The visualized lung apices are limited due to motion artifact but there is no obvious pneumothorax.  There is a large left pleural effusion.  IMPRESSION: Degenerative changes in the cervical spine.  No displaced fractures are identified.  Subcutaneous emphysema in the left neck which may be related to a central venous catheter insertion.  A large left pleural effusion is identified.   Original Report Authenticated By: Burman Nieves, M.D.    Dg Chest Port 1 View  04/09/2012  *RADIOLOGY REPORT*  Clinical Data: Right-sided pain after fall.  PORTABLE CHEST - 1 VIEW  Comparison: 04/07/2012  Findings: Enteric tube remains in place.  Left central venous catheter is unchanged in position.  Persistent left pleural effusion with infiltration or atelectasis in the left base.  This appears stable since previous study.   Minimal infiltration in the right lung bases somewhat improved.  No pneumothorax.  IMPRESSION: Stable appearance of left pleural effusion with infiltration or atelectasis in the left base.  Mild improvement of infiltration in the right base.   Original Report Authenticated By: Burman Nieves, M.D.     Anti-infectives: Anti-infectives   Start     Dose/Rate Route Frequency Ordered Stop   04/01/12 1030  ciprofloxacin (CIPRO) IVPB 400 mg     400 mg 200 mL/hr over 60 Minutes Intravenous To Surgery 04/01/12 1028 04/01/12 1058   03/26/12 2100  vancomycin (VANCOCIN) IVPB 1000 mg/200 mL premix  Status:  Discontinued     1,000 mg 200 mL/hr over 60 Minutes Intravenous Every 24 hours 03/25/12 1934 03/28/12 0913   03/26/12 0945  ciprofloxacin (CIPRO) IVPB 400 mg  Status:  Discontinued     400 mg 200 mL/hr over 60 Minutes Intravenous Every 12 hours 03/26/12 0933 03/29/12 1331   03/26/12 0600  aztreonam (AZACTAM) 1 g in dextrose 5 % 50 mL IVPB  Status:  Discontinued     1 g 100 mL/hr over 30 Minutes Intravenous 3 times per day 03/25/12 1934 03/26/12 0929   03/25/12 2200  metroNIDAZOLE (FLAGYL) IVPB 500 mg  Status:  Discontinued     500 mg 100 mL/hr over 60 Minutes Intravenous Every 8 hours 03/25/12 1909 03/28/12 0913   03/25/12 2000  vancomycin (VANCOCIN) IVPB 1000 mg/200 mL premix     1,000 mg 200 mL/hr over 60 Minutes Intravenous STAT 03/25/12 1933 03/25/12 2054   03/25/12 2000  aztreonam (AZACTAM) 1 g in dextrose 5 % 50 mL IVPB     1 g 100 mL/hr over 30 Minutes Intravenous STAT 03/25/12 1933 03/25/12 2024   03/20/12 1145  ciprofloxacin (CIPRO) IVPB 400 mg    Comments:  ON CALL TO OR   400 mg 200 mL/hr over 60 Minutes Intravenous  Once 03/20/12 1143 03/20/12 1722   03/18/12 1000  vancomycin (VANCOCIN) 1,500 mg in sodium chloride 0.9 % 500 mL IVPB  Status:  Discontinued     1,500 mg 250 mL/hr over 120 Minutes Intravenous Every 48 hours 03/17/12 1920 03/19/12 0840   03/14/12 1000  micafungin  (MYCAMINE) 100 mg in sodium chloride 0.9 % 100 mL IVPB  Status:  Discontinued     100 mg 100 mL/hr over 1 Hours Intravenous Daily 03/13/12 0632 03/19/12 0840   03/13/12 1000  micafungin (MYCAMINE) 100 mg in sodium chloride 0.9 % 100 mL IVPB     100 mg 100 mL/hr over 1 Hours Intravenous Daily 03/13/12 0632 03/13/12 1311   03/12/12 1800  vancomycin (VANCOCIN) IVPB 1000 mg/200 mL premix  Status:  Discontinued     1,000 mg 200 mL/hr over 60 Minutes Intravenous Every 24 hours 03/12/12 0859 03/17/12 1017   03/12/12 1000  imipenem-cilastatin (PRIMAXIN) 500 mg in sodium chloride 0.9 % 100 mL IVPB  Status:  Discontinued  500 mg 200 mL/hr over 30 Minutes Intravenous 3 times per day 03/12/12 0848 03/12/12 0909   03/12/12 1000  imipenem-cilastatin (PRIMAXIN) 250 mg in sodium chloride 0.9 % 100 mL IVPB  Status:  Discontinued     250 mg 200 mL/hr over 30 Minutes Intravenous 4 times per day 03/12/12 0909 03/19/12 0841   03/11/12 1200  vancomycin (VANCOCIN) 750 mg in sodium chloride 0.9 % 150 mL IVPB  Status:  Discontinued     750 mg 150 mL/hr over 60 Minutes Intravenous Every 12 hours 03/11/12 0204 03/12/12 0858   03/11/12 0215  vancomycin (VANCOCIN) 750 mg in sodium chloride 0.9 % 150 mL IVPB     750 mg 150 mL/hr over 60 Minutes Intravenous  Once 03/11/12 0204 03/11/12 0352   03/11/12 0215  aztreonam (AZACTAM) 1 g in dextrose 5 % 50 mL IVPB  Status:  Discontinued     1 g 100 mL/hr over 30 Minutes Intravenous 3 times per day 03/11/12 0204 03/12/12 0851   03/05/12 2200  metroNIDAZOLE (FLAGYL) IVPB 500 mg  Status:  Discontinued    Comments:  First dose ASAP   500 mg 100 mL/hr over 60 Minutes Intravenous Every 8 hours 03/05/12 1346 03/19/12 1509   03/02/12 1200  vancomycin (VANCOCIN) 50 mg/mL oral solution 500 mg  Status:  Discontinued     500 mg Oral 4 times per day 03/02/12 0913 03/19/12 1509   03/01/12 1445  vancomycin (VANCOCIN) 50 mg/mL oral solution 500 mg  Status:  Discontinued     500 mg Oral  3 times per day 03/01/12 1340 03/02/12 0913   03/01/12 1400  aztreonam (AZACTAM) 1 g in dextrose 5 % 50 mL IVPB  Status:  Discontinued     1 g 100 mL/hr over 30 Minutes Intravenous 3 times per day 03/01/12 1356 03/02/12 1146   03/01/12 1300  vancomycin (VANCOCIN) 1,250 mg in sodium chloride 0.9 % 250 mL IVPB  Status:  Discontinued     1,250 mg 166.7 mL/hr over 90 Minutes Intravenous Every 12 hours 03/01/12 1127 03/02/12 1146   02/29/12 0600  ciprofloxacin (CIPRO) IVPB 400 mg     400 mg 200 mL/hr over 60 Minutes Intravenous On call to O.R. 02/28/12 1409 02/28/12 1438   02/28/12 2200  vancomycin (VANCOCIN) 500 mg in sodium chloride irrigation 0.9 % 60 mL ENEMA  Status:  Discontinued     500 mg Rectal 2 times daily 02/28/12 1504 02/28/12 1817   02/28/12 1200  vancomycin (VANCOCIN) 50 mg/mL oral solution 500 mg  Status:  Discontinued    Comments:  First dose ASAP   500 mg Per Tube 4 times per day 02/28/12 0814 02/28/12 1817   02/28/12 1000  vancomycin (VANCOCIN) IVPB 1000 mg/200 mL premix  Status:  Discontinued     1,000 mg 200 mL/hr over 60 Minutes Intravenous Every 12 hours 02/28/12 0845 03/01/12 1127   02/28/12 1000  vancomycin (VANCOCIN) 500 mg in sodium chloride irrigation 0.9 % 100 mL ENEMA  Status:  Discontinued     500 mg Rectal 2 times daily 02/28/12 0923 02/28/12 1504   02/27/12 1600  metroNIDAZOLE (FLAGYL) IVPB 500 mg  Status:  Discontinued     500 mg 100 mL/hr over 60 Minutes Intravenous Every 8 hours 02/27/12 1546 02/27/12 2000   02/27/12 1600  ciprofloxacin (CIPRO) IVPB 400 mg  Status:  Discontinued     400 mg 200 mL/hr over 60 Minutes Intravenous Every 12 hours 02/27/12 1547 02/27/12 2000  02/27/12 1545  metroNIDAZOLE (FLAGYL) IVPB 500 mg  Status:  Discontinued     500 mg 100 mL/hr over 60 Minutes Intravenous  Once 02/27/12 1535 02/28/12 0843   02/27/12 1545  vancomycin (VANCOCIN) 500 mg in sodium chloride 0.9 % 100 mL IVPB  Status:  Discontinued     500 mg 100 mL/hr over  60 Minutes Intravenous  Once 02/27/12 1535 02/27/12 1538   02/27/12 1545  ciprofloxacin (CIPRO) IVPB 400 mg  Status:  Discontinued     400 mg 200 mL/hr over 60 Minutes Intravenous  Once 02/27/12 1538 02/28/12 0843   02/27/12 1415  vancomycin (VANCOCIN) 500 mg in sodium chloride 0.9 % 100 mL IVPB     500 mg 100 mL/hr over 60 Minutes Intravenous  Once 02/27/12 1402 02/27/12 1411   02/27/12 1400  vancomycin (VANCOCIN) powder 500 mg  Status:  Discontinued     500 mg Other To Surgery 02/27/12 1359 02/27/12 1402   02/27/12 1200  metroNIDAZOLE (FLAGYL) IVPB 500 mg  Status:  Discontinued    Comments:  First dose ASAP   500 mg 100 mL/hr over 60 Minutes Intravenous Every 6 hours 02/27/12 0953 03/05/12 1346   02/27/12 1200  vancomycin (VANCOCIN) 50 mg/mL oral solution 250 mg  Status:  Discontinued    Comments:  First dose ASAP   250 mg Oral 4 times per day 02/27/12 0953 02/28/12 0814   02/26/12 1700  oseltamivir (TAMIFLU) capsule 75 mg     75 mg Oral  Once 02/26/12 1651 02/26/12 1715       Assessment/Plan  1. S/p open abdomen, now closed with retentions and dressing changes 2. Dysuria 3. Dysphagia, TF  Plan: 1. Cont TFs secondary to dysphagia.  MBS ok from my standpoint if needed by ST. 2. Will send UA, C&S 3. Cont dressing changes to wound.   LOS: 44 days    OSBORNE,KELLY E 04/10/2012, 8:34 AM Pager: [phone]  Wound looks good.  Bilirubin elevated and he does have some scleral icterus.  He may have cholecystitis but minimal tenderness on exam.  Will repeat LFT's tomorrow.  If increasing, will check [place] or HIDA.  If he does have cholecystitis, then he would require perc drainage since it would not be possible to do cholecystectomy at this point.

## 2012-04-10 NOTE — Progress Notes (Signed)
PULMONARY  / CRITICAL CARE MEDICINE  Name: Connor Small MRN: [id number] DOB: 1960/05/03    ADMISSION DATE:  02/26/2012 CONSULTATION DATE:  2/5  REFERRING MD :  Karilyn Cota  CHIEF COMPLAINT:  Acute resp failure   BRIEF PATIENT DESCRIPTION:  58 YOM admitted to [facility] for Cdiff on 2/2. Underwent decompressive cecostomy 2/3, initially was better, then worsened on 2/4 with increased abdominal distension and tenderness, decreased renal function.  He was taken back to the OR on 2/4 for total abdominal colectomy and end ileostomy.  Post op he developed ARDS and septic shock and was being treated for this.  The morning of 2/6 he had new onset of A-fib and transferred to [facility].   Course complicated by severe hypoalbuminemia, elevated LFTs  -cholestatic without biliary obstruction & pre-renal failure. Hypotensive 3/2 am requiring albumin  LINES / TUBES: ETT ([facility]) 2/4>>> 2/20 PICC ([facility]) 2/4>>>2/11 Art Line ([facility]) 2/5>>>out L IJ CVL 2/06 >> out A line R fem 2/18>>>2/24 Rt ij HD 2/17 >> out Left IJ 2/18 >>  ETT 3/3>>>out post op x 2  CULTURES: BCx2 2/4>>>negative BCx2 2/3>>>negative UC 2/3- Negative MRSA PCR 2/3- Negative C diff 2/6>>>Positive Body fluid 2/9>>>WBCs, no organisms 2/15 BC x 2 >> NEG 2/15 Sputum >>>NF 3/2 bc>>>NGTD 3/2 urine>>>neg 3/2 sputum>>>neg 3/5 cdiff>>>neg  PATH: 3/3 liver bx>>>BENIGN LIVER WITH CHOLESTASIS, The biopsy findings are that of extrahepatic biliary obstruction. No noted primary sclerosing cholangitis.  ANTIBIOTICS: Flagyl 2/3>> d/c'd Vanc 2/6 (IV) >>>2/7 azactam 2/6>>>2/7 Oral vanc 2/6>>>off IV vanc 2/16 >2/24 Aztreonam 2/16 >2/17 Imipenem 2/17>>>2/24 mycofungin 2/18>>>2/24  cipro 3/3>>>3/6 azactam 3/2>>>3/3 vanc 3/2>>>3/5 Flagyl 3/3>>>3/5   SIGNIFICANT EVENTS / STUDIES:  2/2 Admit to [facility] with C diff, toxic megacolon 2/3 Decompressive Cecostomy 2/4 Total abdominal colectomy and end ileostomy 2/6 New A-fib and transfer to [facility]  health. 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 2/10 CT abd/pelvis: small hematoma likely subcapsular spleen, JPs wnl 2/11 trial TFs  2/11 UE venous dopplers: Findings consistent with superficial vein thrombosis involving the right basilic vein 2/14 LE venous dopplers: No evidence of deep vein thrombosis 2/14 LUE venous doppler: No evidence of deep vein or superficial thrombosis involving the left upper extremity  2/15 neg 4 l bal on lasix drip, lasix d/c  2/16 poor neuro status, CT head: [facility] 2/17 CRRT initiated, high pressor needs 2/17 RUQ [place]: Gallbladder wall thickening in the absence of sludge or stones but no biliary ductal dilatation 2/18 reduced vasopressor reqts 2/19 New onset thrombocytopenia. Heparin D/C'd. Argatroban initiated 2/19 [place] abd (repeat): No explanation for elevated liver function tests 2/20 reduction pressors, improved alertness, extubated 2/20 LE venous dopplers: No evidence of deep vein or superficial thrombosis 2/21 bleeding overnight from JP's, argatroban turned off, lower pressor needs 2/22 off pressors, on RA, converted to NSR 2/22 Carotid dopplers: No significant extracranial carotid artery stenosis demonstrated 2/25 Cystoscopy with bilateral retrograde pyelograms. No extravasation of contrast from either ureteral orifice. No evidence of hydroureteronephrosis. Unremarkable urinary bladder. B ureteral stents placed 2/27 MRI brain: Scattered small acute to subacute infarcts in both cerebral hemispheres, brainstem, and right cerebellum. Favor sequelae of emboli from a cardiac or proximal aortic source. Main alternate consideration is hypotensive episode 2/27 MRCP: No intrahepatic bile duct dilatation or common bile duct dilatation. No gallstones identified. Large subcapsular fluid collection encases the spleen. This is of  indeterminate etiology.  This may represent a subacute to chronic subcapsular hematoma or abscess. 2/28 SLP eval: FEES performed. Oropharyngeal weakness noted. Recommend replacement of NGT to supplement PO intake 3/1 Renal signed off 3/3- bowel evisceration, to OR 3/3- no perf / abscess, wound vac open could not close, Liver BX, some oozing, post op resp failure 3/3 echo repeat - improved ef55%, poor windows 3/4- low dose pressor needs 3/4- extubated, high output again from JP's 3/6- OR trip, partial closure wound 3/7- renal fxn continues to improve 3/8- doppler legs >>>Prelim NEG 3/9- hypotension, volume given 3/9- closed, retention service 3/10- GI bleed, required Transfusion, hemodynamics improved, ppi drip 3/12- bili down 04/08/12 - Looks stable sitting up in chair.  No increased wob.  Still requiring tube feeds due to swallowing issues.  Surgery following wound.   SUBJECTIVE/OVERNIGHT/INTERVAL HX 04/09/2012: Wife at bedside. She is very concerned that patient's pain needs are not being attended to especially during the night shift. She is requesting scheduled pain medications at night with hold orders./Parameters. She feels that patient's pain is less from abdominal incision and mostly from back and joints due to deconditioning and lying down. Nursing reports that he constantly as the pain medications on the clock  Physical therapy is requesting inpatient rehabilitation consult  VITAL SIGNS: Temp:  [97.4 F (36.3 C)-98.8 F (37.1 C)] 97.5 F (36.4 C) (03/18 1200) Pulse Rate:  [95-122] 115 (03/18 1525) Resp:  [13-27] 22 (03/18 1525) BP: (97-123)/(57-74) 116/71 mmHg (03/18 1200) SpO2:  [98 %-100 %] 99 % (03/18 1200) Weight:  [84.1 kg (185 lb 6.5 oz)] 84.1 kg (185 lb 6.5 oz) (03/17 2332)  INTAKE / OUTPUT: Intake/Output     03/17 0701 - 03/18 0700 03/18 0701 - 03/19 0700   I.V. (mL/kg) 400 (4.8) 180 (2.1)   NG/GT 2245 680   Total Intake(mL/kg) 2645 (31.5) 860 (10.2)   Urine  (mL/kg/hr) 1125 (0.6) 300 (0.4)   Stool  450 (0.6)   Total Output 1125 750   Net +1520 +110        Stool Occurrence 401 x      PHYSICAL EXAMINATION: General very deconditioned male. Icteric, no increased wob Neuro: follows commands well, moves all 4.  HEENT: Sclericterus, no TMG or LN Cardiovascular: regular mild tachy Lungs: CTA, good air entry, mild basilar crackles. Abdomen: dressing in place, bs + Musculoskeletal: no edema or cyanosis Skin:  No rash  LABS:  CBC    Component Value Date/Time   WBC 4.1 04/10/2012 0650   RBC 2.74* 04/10/2012 0650   HGB 8.7* 04/10/2012 0650   HCT 28.1* 04/10/2012 0650   PLT 120* 04/10/2012 0650   MCV 102.6* 04/10/2012 0650   MCH 31.8 04/10/2012 0650   MCHC 31.0 04/10/2012 0650   RDW 23.6* 04/10/2012 0650   LYMPHSABS 0.3* 04/02/2012 1158   MONOABS 0.3 04/02/2012 1158   EOSABS 0.1 04/02/2012 1158   BASOSABS 0.0 04/02/2012 1158      Recent Labs Lab 04/08/12 2357 04/09/12 0400 04/09/12 1926 04/10/12 0740 04/10/12 1218  GLUCAP 113* 116* 116* 103* 89    Ct Cervical Spine Wo Contrast  04/09/2012  *RADIOLOGY REPORT*  Clinical Data: Diffuse neck pain after fall.  CT CERVICAL SPINE WITHOUT CONTRAST  Technique:  Multidetector CT imaging of the cervical spine was performed. Multiplanar CT image reconstructions were also generated.  Comparison: MRI brain 03/22/2012.  CT head 03/11/2012.  Findings: Mild degenerative changes in the cervical spine with narrowed interspaces at C3-4, C4-5, C5-6, and C6-7 levels.  There  are associated endplate hypertrophic changes.  Mild reversal of the usual cervical lordosis which is likely due to the degenerative changes.  Degenerative changes in the facet joints.  No vertebral compression deformities.  No prevertebral soft tissue swelling.  No focal bone lesion or bone destruction.  There is subcutaneous emphysema predominately in the left neck involving the sternocleidomastoid muscles and extending across the midline over the  laryngeal region.  There is some dissection around the jugular vein.  There is a left central venous catheter in place in this emphysema may be related catheter insertion.  The visualized lung apices are limited due to motion artifact but there is no obvious pneumothorax.  There is a large left pleural effusion.  IMPRESSION: Degenerative changes in the cervical spine.  No displaced fractures are identified.  Subcutaneous emphysema in the left neck which may be related to a central venous catheter insertion.  A large left pleural effusion is identified.   Original Report Authenticated By: Burman Nieves, M.D.    Dg Chest Port 1 View  04/09/2012  *RADIOLOGY REPORT*  Clinical Data: Right-sided pain after fall.  PORTABLE CHEST - 1 VIEW  Comparison: 04/07/2012  Findings: Enteric tube remains in place.  Left central venous catheter is unchanged in position.  Persistent left pleural effusion with infiltration or atelectasis in the left base.  This appears stable since previous study.  Minimal infiltration in the right lung bases somewhat improved.  No pneumothorax.  IMPRESSION: Stable appearance of left pleural effusion with infiltration or atelectasis in the left base.  Mild improvement of infiltration in the right base.   Original Report Authenticated By: Burman Nieves, M.D.    Imaging x 48 hours Ct Cervical Spine Wo Contrast  04/09/2012  *RADIOLOGY REPORT*  Clinical Data: Diffuse neck pain after fall.  CT CERVICAL SPINE WITHOUT CONTRAST  Technique:  Multidetector CT imaging of the cervical spine was performed. Multiplanar CT image reconstructions were also generated.  Comparison: MRI brain 03/22/2012.  CT head 03/11/2012.  Findings: Mild degenerative changes in the cervical spine with narrowed interspaces at C3-4, C4-5, C5-6, and C6-7 levels.  There are associated endplate hypertrophic changes.  Mild reversal of the usual cervical lordosis which is likely due to the degenerative changes.  Degenerative changes  in the facet joints.  No vertebral compression deformities.  No prevertebral soft tissue swelling.  No focal bone lesion or bone destruction.  There is subcutaneous emphysema predominately in the left neck involving the sternocleidomastoid muscles and extending across the midline over the laryngeal region.  There is some dissection around the jugular vein.  There is a left central venous catheter in place in this emphysema may be related catheter insertion.  The visualized lung apices are limited due to motion artifact but there is no obvious pneumothorax.  There is a large left pleural effusion.  IMPRESSION: Degenerative changes in the cervical spine.  No displaced fractures are identified.  Subcutaneous emphysema in the left neck which may be related to a central venous catheter insertion.  A large left pleural effusion is identified.   Original Report Authenticated By: Burman Nieves, M.D.    Dg Chest Port 1 View  04/09/2012  *RADIOLOGY REPORT*  Clinical Data: Right-sided pain after fall.  PORTABLE CHEST - 1 VIEW  Comparison: 04/07/2012  Findings: Enteric tube remains in place.  Left central venous catheter is unchanged in position.  Persistent left pleural effusion with infiltration or atelectasis in the left base.  This appears stable since previous study.  Minimal infiltration in the right lung bases somewhat improved.  No pneumothorax.  IMPRESSION: Stable appearance of left pleural effusion with infiltration or atelectasis in the left base.  Mild improvement of infiltration in the right base.   Original Report Authenticated By: Burman Nieves, M.D.     ASSESSMENT AND PLAN  RESPIRATORY No results found for this basename: PHART, PCO2, PCO2ART, PO2ART, HCO3, O2SAT,  in the last 168 hours A:  Pleural effusion, L > R Remaining effusion but no distress P: - Continue aggressive pulmonary toilet - Push ambulation.  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 - 04/09/2012: Normal mental status P: Monitor  INFECTIOUS DISEASE  Recent Labs Lab 04/07/12 0500 04/07/12 1920 04/08/12 0550 04/09/12 0500 04/10/12 0650  WBC 4.1 3.7* 3.3* 3.5* 4.1  No results found for this basename: LATICACIDVEN, PROCALCITON,  in the last 168 hours  A:  Mild leukopenia P: Monitor  RENAL and electrolytes I/O last 3 completed shifts: In: 3905 [I.V.:640; NG/GT:3265] Out: 1600 [Urine:1600]  Recent Labs Lab 04/05/12 2000 04/06/12 0453 04/08/12 0550 04/09/12 0500 04/10/12 0650  NA 146* 147* 149* 152* 154*  K 4.1 4.1 3.8 3.7 4.2  CL 116* 117* 120* 123* 125*  CO2 21 21 21 21 21   BUN 57* 57* 61* 61* 60*  CREATININE 1.41* 1.40* 1.37* 1.30 1.27  CALCIUM 7.7* 7.8* 8.0* 8.3* 8.5  MG  --   --   --   --  1.9  PHOS  --   --   --   --  2.7   A:ARF (acute renal failure), Cr leveling off -?now CKD   Hypovolemia 3/9, ATN 3/10 from GI bleed - 04/09/2012: Creatinine is improving P: - Increase free water for hypernatremia to 300 q6 hours. - 2 doses of lasix 20 mg. - Replace Mg. - AM labs.  HEMATOLOGIC  Recent Labs Lab 04/07/12 0500 04/07/12 1920 04/08/12 0550 04/09/12 0500 04/10/12 0650  HGB 6.6* 8.5* 8.0* 8.2* 8.7*  HCT 21.0* 26.3* 24.7* 26.3* 28.1*  PLT 112* 103* 104* 113* 120*  INR  --   --   --   --  1.06   A:  Thrombocytopenia, unspecified, HIT panel negative  Coagulopathy - likely due to hepatic dysfunction Anemia of critical illness P: Back red cells for hemoglobin less than 7 g percent  GASTROINTESTINAL  Recent Labs Lab 04/04/12 0500 04/06/12 0453 04/10/12 0650  AST 59* 122* 48*  ALT 110* 161* 90*  ALKPHOS 594* 941* 695*  BILITOT 7.2* 6.7* 3.3*  PROT 3.8* 4.0* 4.9*  ALBUMIN 1.8* 1.7* 1.7*  INR  --   --  1.06   A:  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 Wife not happy with current level of pain control. According to nursing he is looking at the clock every  2 hours for pain medication. The pain seems to be back and neuromuscular otherwise does not want to give him any muscular relaxants. He is happy with fentanyl. I will ensure that he gets fentanyl scheduled at night and as needed during the daytime and the wife is present at bedside in the daytime. As of the fentanyl patch we'll place a lidocaine patch in the shoulder where he has pain.  3/18 persistent pain issues, will add q6 low dose roxanol to hopefully decrease the amount of fentanyl IV uses.  GLOBAL: 04/09/2012: Inpatient rehabilitation consultation requested  3/18 Rehab will not be able to take patient for at least another week.  Will hold in the ICU overnight.  Alyson Reedy, M.D. [facility] Pulmonary/Critical Care Medicine. Pager: [phone]. After hours pager: [phone].

## 2012-04-10 NOTE — Progress Notes (Addendum)
~  2000: Heard yelling. This nurse started walking towards room 2603. Saw Dr. Earnest Conroy outside room and male visitor state "How do you feel about this" and slams door. This nurse follows Dr. Earnest Conroy to nurses station and he begins to use the phone. Male visitor from room 2603 storms through nursing station yelling at MD. This nurse advise male visitor that he is not allow in designated area d/t valuable pt information. Visitor begins yelling and pointing to MD, "Meet me outside, meet me outside". This nurse phones security.  Charge nurse made aware of situation

## 2012-04-10 NOTE — Progress Notes (Signed)
Nurse contacted MD to inform that pt was requiring more than partial doses of fentanyl for pain in back, legs, and butt.  Nurse informed MD that she had followed the order specifics regarding rechecks post administration with additional administrations as needed, however pt requested more pain medication "I want what I got last night".  MD instructed nurse to give 100 mcg at next administration time, nurse will carry out orders as specified.

## 2012-04-10 NOTE — Progress Notes (Signed)
I await further tolerance with therapies to assess if can admit to IP rehab . I will discuss with pt's wife details of IP rehab expectations and goals. [phone]

## 2012-04-10 NOTE — Progress Notes (Signed)
NUTRITION  CONSULT / FOLLOW UP  Intervention:    Continue TF via NG tube with Vital AF 1.2 at goal rate of 85 ml/h to provide 2448 kcals, 153 gm protein, 1654 ml free water daily.  Monitor for diet modification per MD/SLP discretion.  RD to follow and assist pt in meeting needs with POs vs. Nutrition support.  If PO diet resumed, pt would likely benefit from nocturnal tube feeds until diet adequacy can be established.  Nutrition Dx:   Inadequate oral intake related to swallowing difficulty and altered GI function as evidenced by NPO status- ongoing.  Goal: EN to meet >90% of estimated nutrition needs- met.  Monitor:   TF tolerance, weight trend, labs, wound healing.  Assessment: 66 YOM initially admitted to [facility] for Cdiff on 2/2. Pt underwent total abdominal colectomy and end ileostomy. Post op pt developed ARDS and septic shock and was being treated for this. The morning of 2/6 he had new onset of A-fib and transferred to [facility]. Course complicated by severe hypoalbuminemia, elevated LFTs -cholestatic without biliary obstruction & pre-renal failure.   3/11- TF has been on hold due to GI bleed. TF resumed 3/12 and pt has been tolerating TFs since. Dietetic Intern visited pt and spoke with RN.  Per RN, pt still tolerating TF at rate with no problems.  Gastric residuals: 0 mL at last check (3/18). Pt with + colostomy output; appropriate output.   Most recent SLP visit 3/17; still recommending pt remain NPO.  Reassessment to occur Wednesday.  Pt with variable wt since admission.  Admission wt 200 lbs.  Pt underwent permissive underfeeding x3 weeks as appropriate for critically ill obese pt and initiated advancement to full feeds (2/21-2/26).  Feeds were held for surgery (2/26-3/3), resumed with pt reaching goal rate (3/4).  TFs again held (3/11) for GI bleed, resumed (3/12).   Pt now weighs 185 lbs and is noted to have generalized edema. Stool output is appropriate, no indication of  malabsorption. Pt with some progress in PT.  Pt may benefit from nocturnal TFs if able to transition to PO diet until adequate intake can be established.  Pt is at risk for developing malnutrition given acute illness, increased energy expenditure with therapy participation, and fatigue.   Height: Ht Readings from Last 1 Encounters:  03/25/12 5' 9"  (1.753 m)    Weight Status:   Wt Readings from Last 1 Encounters:  04/09/12 185 lb 6.5 oz (84.1 kg)  03/26/12  177 lb 4 oz (80.4 kg)  03/22/12  186 lb 8.2 oz (84.6 kg)  03/21/12  179 lb 7.3 oz (81.4 kg)  03/16/12  192 lb 0.3 oz (87.1 kg)  Admit wt 200 lb   Body mass index is 27.37 kg/(m^2). Overweight.  Re-estimated needs:  Kcal: 2300 - 2500  Protein: 140 - 160 gm  Fluid: >/= 2.3 L  Skin: abdominal incision/wound closed in OR 3/11, skin tear right buttocks   Diet Order:   NPO    Intake/Output Summary (Last 24 hours) at 04/10/12 0902 Last data filed at 04/10/12 0600  Gross per 24 hour  Intake   2235 ml  Output    600 ml  Net   1635 ml    Last BM: 04/09/2012  Labs:   Recent Labs Lab 04/08/12 0550 04/09/12 0500 04/10/12 0650  NA 149* 152* 154*  K 3.8 3.7 4.2  CL 120* 123* 125*  CO2 21 21 21   BUN 61* 61* 60*  CREATININE 1.37* 1.30  1.27  CALCIUM 8.0* 8.3* 8.5  MG  --   --  1.9  PHOS  --   --  2.7  GLUCOSE 124* 113* 117*    CBG (last 3)   Recent Labs  04/08/12 2357 04/09/12 0400 04/09/12 1926  GLUCAP 113* 116* 116*    Scheduled Meds: . antiseptic oral rinse  15 mL Mouth Rinse q12n4p  . chlorhexidine  15 mL Mouth Rinse BID  . fentaNYL  25-100 mcg Intravenous Q2H  . free water  200 mL Per Tube Q6H  . lidocaine  1 patch Transdermal Q24H  . pantoprazole (PROTONIX) IV  40 mg Intravenous Q12H  . sodium chloride  10-40 mL Intracatheter Q12H    Continuous Infusions: . sodium chloride 20 mL/hr (04/10/12 0239)  . feeding supplement (VITAL AF 1.2 CAL) 1,000 mL (04/10/12 0554)    Nelta Numbers   Dietetic Intern Pager: [phone]  Brynda Greathouse, MS RD LDN Clinical Inpatient Dietitian Pager: [phone] Weekend/After hours pager: [phone]

## 2012-04-11 ENCOUNTER — Inpatient Hospital Stay (HOSPITAL_COMMUNITY): Payer: BC Managed Care – PPO

## 2012-04-11 LAB — CBC
MCH: 31.2 pg (ref 26.0–34.0)
MCHC: 30.2 g/dL (ref 30.0–36.0)
Platelets: 120 10*3/uL — ABNORMAL LOW (ref 150–400)
RDW: 22.6 % — ABNORMAL HIGH (ref 11.5–15.5)

## 2012-04-11 LAB — TYPE AND SCREEN
Antibody Screen: NEGATIVE
Unit division: 0

## 2012-04-11 LAB — URINE MICROSCOPIC-ADD ON

## 2012-04-11 LAB — PHOSPHORUS: Phosphorus: 3.1 mg/dL (ref 2.3–4.6)

## 2012-04-11 LAB — URINALYSIS, ROUTINE W REFLEX MICROSCOPIC
Glucose, UA: NEGATIVE mg/dL
Hgb urine dipstick: NEGATIVE
Leukocytes, UA: NEGATIVE
Protein, ur: 30 mg/dL — AB
Specific Gravity, Urine: 1.02 (ref 1.005–1.030)
Urobilinogen, UA: 0.2 mg/dL (ref 0.0–1.0)

## 2012-04-11 LAB — GLUCOSE, CAPILLARY
Glucose-Capillary: 107 mg/dL — ABNORMAL HIGH (ref 70–99)
Glucose-Capillary: 111 mg/dL — ABNORMAL HIGH (ref 70–99)
Glucose-Capillary: 120 mg/dL — ABNORMAL HIGH (ref 70–99)
Glucose-Capillary: 94 mg/dL (ref 70–99)
Glucose-Capillary: 96 mg/dL (ref 70–99)
Glucose-Capillary: 113 mg/dL — ABNORMAL HIGH (ref 70–99)

## 2012-04-11 LAB — BASIC METABOLIC PANEL
Calcium: 8.3 mg/dL — ABNORMAL LOW (ref 8.4–10.5)
GFR calc Af Amer: 72 mL/min — ABNORMAL LOW (ref >90)
GFR calc non Af Amer: 62 mL/min — ABNORMAL LOW (ref >90)
Potassium: 3.6 mEq/L (ref 3.5–5.1)
Sodium: 152 mEq/L — ABNORMAL HIGH (ref 135–145)

## 2012-04-11 LAB — MAGNESIUM: Magnesium: 1.9 mg/dL (ref 1.5–2.5)

## 2012-04-11 MED ORDER — POTASSIUM CHLORIDE 20 MEQ/15ML (10%) PO LIQD
40.0000 meq | Freq: Three times a day (TID) | ORAL | Status: AC
  Administered 2012-04-11 (×2): 40 meq
  Filled 2012-04-11 (×2): qty 30

## 2012-04-11 MED ORDER — PROMETHAZINE HCL 25 MG/ML IJ SOLN
12.5000 mg | Freq: Four times a day (QID) | INTRAMUSCULAR | Status: DC | PRN
  Administered 2012-04-11: 12.5 mg via INTRAVENOUS
  Filled 2012-04-11: qty 1

## 2012-04-11 MED ORDER — METHADONE HCL 5 MG/5ML PO SOLN
2.5000 mg | Freq: Two times a day (BID) | ORAL | Status: DC
  Administered 2012-04-11 (×2): 2.5 mg
  Filled 2012-04-11: qty 2
  Filled 2012-04-11: qty 1
  Filled 2012-04-11: qty 3

## 2012-04-11 MED ORDER — METHADONE HCL 5 MG PO TABS: 2.5000 mg | ORAL_TABLET | Freq: Two times a day (BID) | ORAL | Status: DC

## 2012-04-11 MED ORDER — FUROSEMIDE 10 MG/ML IJ SOLN
20.0000 mg | Freq: Three times a day (TID) | INTRAMUSCULAR | Status: AC
  Administered 2012-04-11 (×2): 20 mg via INTRAVENOUS
  Filled 2012-04-11: qty 2

## 2012-04-11 MED ORDER — MAGNESIUM SULFATE 40 MG/ML IJ SOLN
2.0000 g | Freq: Once | INTRAMUSCULAR | Status: AC
  Administered 2012-04-11: 2 g via INTRAVENOUS
  Filled 2012-04-11: qty 50

## 2012-04-11 MED ORDER — FUROSEMIDE 10 MG/ML IJ SOLN
INTRAMUSCULAR | Status: AC
  Filled 2012-04-11: qty 4

## 2012-04-11 NOTE — Procedures (Signed)
Objective Swallowing Evaluation: Fiberoptic Endoscopic Evaluation of Swallowing  Patient Details  Name: Connor Small MRN: [id number] Date of Birth: 02-25-1960  Today's Date: 04/11/2012 Time: 1030-1109 SLP Time Calculation (min): 39 min  Past Medical History:  Past Medical History  Diagnosis Date  . Chronic diarrhea   . Rectal bleed   . Hemorrhoids   . Ulcerative colitis     Distal UC over 8 yrs ago diagnosed  . Diverticulitis of large intestine with perforation 10/2011    done at [facility]   Past Surgical History:  Past Surgical History  Procedure Laterality Date  . Colonoscopy  9/03  . Sigmoidoscopy       06/13/2002  . Temporary ostomy November 06, 2010      for a colon perforation that was done in [facility] (Dr Ruben Im).    . Colonoscopy  02/16/2011    Procedure: COLONOSCOPY;  Surgeon: Malissa Hippo, MD;  Location: [facility];  Service: Endoscopy;  Laterality: N/A;  100  . Flexible sigmoidoscopy  02/27/2012    Procedure: FLEXIBLE SIGMOIDOSCOPY;  Surgeon: Malissa Hippo, MD;  Location: [facility];  Service: Endoscopy;  Laterality: N/A;  with colonic decompression  . Laparotomy  02/27/2012    Procedure: EXPLORATORY LAPAROTOMY;  Surgeon: Fabio Bering, MD;  Location: [facility];  Service: General;  Laterality: N/A;  . Cecostomy  02/27/2012    Procedure: CECOSTOMY;  Surgeon: Fabio Bering, MD;  Location: [facility];  Service: General;  Laterality: N/A;  Cecostomy Tube Placement  . Colectomy  02/28/2012    Procedure: TOTAL COLECTOMY;  Surgeon: Fabio Bering, MD;  Location: [facility];  Service: General;  Laterality: N/A;  . Ileostomy  02/28/2012    Procedure: ILEOSTOMY;  Surgeon: Fabio Bering, MD;  Location: [facility];  Service: General;  Laterality: N/A;  . Cystoscopy w/ ureteral stent placement Bilateral 03/20/2012    Procedure: CYSTOSCOPY WITH RETROGRADE PYELOGRAM/URETERAL STENT PLACEMENT;  Surgeon: Sebastian Ache, MD;  Location: [facility] OR;  Service: Urology;  Laterality: Bilateral;  .  Laparotomy N/A 03/26/2012    Procedure: EXPLORATORY LAPAROTOMY;  Surgeon: Mariella Saa, MD;  Location: [facility] OR;  Service: General;  Laterality: N/A;  . Application of wound vac N/A 03/26/2012    Procedure: APPLICATION OF WOUND VAC;  Surgeon: Mariella Saa, MD;  Location: [facility] OR;  Service: General;  Laterality: N/A;  . Liver biopsy N/A 03/26/2012    Procedure: LIVER BIOPSY;  Surgeon: Mariella Saa, MD;  Location: [facility] OR;  Service: General;  Laterality: N/A;  . Laparotomy N/A 03/29/2012    Procedure: EXPLORATORY LAPAROTOMY, PARTIAL WOUND CLOSURE;  Surgeon: Mariella Saa, MD;  Location: [facility] OR;  Service: General;  Laterality: N/A;  . Vacuum assisted closure change N/A 03/29/2012    Procedure: Open ABDOMINAL VACUUM  CHANGE;  Surgeon: Mariella Saa, MD;  Location: [facility] OR;  Service: General;  Laterality: N/A;  . Vacuum assisted closure change N/A 04/01/2012    Procedure: removal of abdominal vac dressing and abdominal closure;  Surgeon: Mariella Saa, MD;  Location: [facility] OR;  Service: General;  Laterality: N/A;   HPI:  77 YOM admitted to [facility] for Cdiff on 2/2. Underwent decompressive cecostomy 2/3, initially was better, then worsened on 2/4 with increased abdominal distension and tenderness, decreased renal function.  He was taken back to the OR on 2/4 for total abdominal colectomy and end ileostomy.  Post op he developed ARDS and sepsis shock and was being treated  for this.  The morning of 2/6 he had new onset of A-fib and transferred to [facility].  Initial FEES showed severe, dry pharyngeal secretions. after 2-3 days of frequent oral care and ice chips, repeat FEES showed improved pharyngeal hygiene, DI/pudding stated with continue TF. Over the following week pt underent two more surgeries for abdominal wound with brief intubation x2 and then developed GI bleed. Today  2nd repeat FEES since aforementioned events to determine if pt is able to consume POs again. He has been taking few ice  chips with family and completing pharyngeal strengthening exercises.      Assessment / Plan / Recommendation Clinical Impression  Clinical impression: Pt demonstrated improved function since previous FEES. Base of tongue and pharyngeal strength improved, increased epiglottic deflection. Pt transited 5 bites of purees and solids without any penetration and with mild to moderate residuals WIth all liquid consistencies laryngeal closure and timeing of swallow is not adequate to protect airway. Aspiration (of liquids) occurs consistently and pts strength of volitional cough is still not adequate to expel aspirate. Recommend pt initate a Dys 3/pudding thick diet (no liquids). Pt still unlikely to consume sufficient nutrition to support himself and keep himslef strong enough for recovery. His endurance is very poor and would likely consume only a few bites at each meal. Suggest continue alternate nutrition with consideration for a soft Kangaroo NG for pts comfort if NG suction is no longer needed. SLP will continue to follow closely. Pt may continue to have ice chips independent of other PO after oral care.     Treatment Recommendation  Therapy as outlined in treatment plan below    Diet Recommendation Dysphagia 3 (Mechanical Soft);Pudding-thick liquid   Medication Administration: Via alternative means Supervision: Staff feed patient;Trained caregiver to feed patient Compensations: Slow rate;Small sips/bites;Multiple dry swallows after each bite/sip;Clear throat intermittently;Effortful swallow Postural Changes and/or Swallow Maneuvers: Chin tuck;Seated upright 90 degrees    Other  Recommendations Oral Care Recommendations: Oral care QID Other Recommendations: Have oral suction available;Order thickener from pharmacy;Prohibited food (jello, ice cream, thin soups)   Follow Up Recommendations  Inpatient Rehab    Frequency and Duration min 4x/week  2 weeks   Pertinent Vitals/Pain BD    SLP Swallow  Goals Patient will utilize recommended strategies during swallow to increase swallowing safety with: Maximum assistance;Minimal cueing Goal #3: Pt will complete base of tongue/pharyngeal/laryngeal strengthening exercises x10 each with moderate verbal cues.  Swallow Study Goal #3 - Progress: Progressing toward goal   General HPI: 18 YOM admitted to [facility] for Cdiff on 2/2. Underwent decompressive cecostomy 2/3, initially was better, then worsened on 2/4 with increased abdominal distension and tenderness, decreased renal function.  He was taken back to the OR on 2/4 for total abdominal colectomy and end ileostomy.  Post op he developed ARDS and sepsis shock and was being treated for this.  The morning of 2/6 he had new onset of A-fib and transferred to [facility].  Initial FEES showed severe, dry pharyngeal secretions. after 2-3 days of frequent oral care and ice chips, repeat FEES showed improved pharyngeal hygiene, DI/pudding stated with continue TF. Over the following week pt underent two more surgeries for abdominal wound with brief intubation x2 and then developed GI bleed. Today  2nd repeat FEES since aforementioned events to determine if pt is able to consume POs again. He has been taking few ice chips with family and completing pharyngeals trengthening exercises.  Type of Study: Fiberoptic Endoscopic Evaluation of Swallowing  Reason for Referral: Objectively evaluate swallowing function Previous Swallow Assessment: FEES 03/20/12, 2/28, 3/12 Diet Prior to this Study: NPO Temperature Spikes Noted: No Respiratory Status: Room air History of Recent Intubation: Yes Length of Intubations (days): 16 days Date extubated: 03/16/12 Behavior/Cognition: Alert;Cooperative;Pleasant mood Oral Cavity - Dentition: Adequate natural dentition Oral Motor / Sensory Function: Within functional limits Self-Feeding Abilities: Total assist Patient Positioning: Upright in bed Baseline Vocal Quality: Hoarse;Low vocal  intensity Volitional Cough: Weak Volitional Swallow: Able to elicit Anatomy: Within functional limits Pharyngeal Secretions: Normal    Reason for Referral Objectively evaluate swallowing function   Oral Phase Oral Preparation/Oral Phase Oral Phase: WFL Oral - Honey Oral - Honey Teaspoon: Within functional limits Oral - Thin Oral - Thin Teaspoon: Within functional limits Oral - Solids Oral - Puree: Within functional limits Oral - Mechanical Soft: Within functional limits   Pharyngeal Phase Pharyngeal Phase Pharyngeal Phase: Impaired Pharyngeal - Honey Pharyngeal - Honey Teaspoon: Delayed swallow initiation;Premature spillage to pyriform sinuses;Reduced pharyngeal peristalsis;Reduced airway/laryngeal closure;Reduced tongue base retraction;Penetration/Aspiration before swallow;Moderate aspiration;Pharyngeal residue - valleculae;Pharyngeal residue - pyriform sinuses;Inter-arytenoid space residue;Lateral channel residue Penetration/Aspiration details (honey teaspoon): Material enters airway, passes BELOW cords and not ejected out despite cough attempt by patient Pharyngeal - Nectar Pharyngeal - Nectar Teaspoon: Not tested Pharyngeal - Thin Pharyngeal - Ice Chips: Not tested Pharyngeal - Solids Pharyngeal - Puree: Reduced pharyngeal peristalsis;Reduced tongue base retraction;Pharyngeal residue - pyriform sinuses;Pharyngeal residue - cp segment;Lateral channel residue Penetration/Aspiration details (puree): Material does not enter airway Pharyngeal - Mechanical Soft: Reduced pharyngeal peristalsis;Reduced tongue base retraction;Pharyngeal residue - pyriform sinuses;Pharyngeal residue - cp segment;Lateral channel residue;Pharyngeal residue - valleculae  Cervical Esophageal Phase    GO             Harlon Ditty, MA CCC-SLP [phone]  Claudine Mouton 04/11/2012, 11:27 AM

## 2012-04-11 NOTE — Progress Notes (Signed)
Occupational Therapy Treatment Patient Details Name: Connor Small MRN: [id number] DOB: 12/13/1960 Today's Date: 04/11/2012 Time: 7829-5621 OT Time Calculation (min): 54 min  OT Assessment / Plan / Recommendation Comments on Treatment Session Excellent session. Pt is demonstrating increase BUE strength. L>R. Pt continues to demonstrate weakness in R UE as described in previous note, however, is beginning to demonstrate increased bicep strength. Shoulder strength as well as shoulder girdle alignment is improving. Decreased subluxation noted B. Wife present for education and participates in therapy. Pt able to see today that he is making  progress. Continue to rec CIR as appropriate D/C venue.  will conitue to benefit from  OT services to facilitate D/C to next venue of care.     Follow Up Recommendations  CIR    Barriers to Discharge   none    Equipment Recommendations  3 in 1 bedside comode;Tub/shower bench    Recommendations for Other Services Rehab consult  Frequency Min 3X/week   Plan Discharge plan remains appropriate    Precautions / Restrictions Precautions Precautions: Fall Precaution Comments: Abd binder, ostomy, panda Required Braces or Orthoses: Other Brace/Splint Other Brace/Splint: R wrist cock up slint   Pertinent Vitals/Pain stable    ADL  ADL Comments: Focus of session of neuro rehab BUE. Facilitated B UE AROM using PNF D1D2 diagonals, place and hold and rythmic stabilization techniques. Also facilitated scapular strengthening B. Squeeze ball B. theraputty LUE. Pt able to complete hand to mouth pattern LUE. Set up to work with theraputty on tray table. Worked with wife on education regarding RUE elbow flexion strengthening in G eleminated position. Also discussed need for pt to begin washing his face and assisting with ice chips  using l hand.     OT Diagnosis:    OT Problem List:   OT Treatment Interventions:     OT Goals Acute Rehab OT Goals OT Goal  Formulation: With patient Time For Goal Achievement: 04/16/12 Potential to Achieve Goals: Good ADL Goals Pt Will Perform Grooming: with mod assist;Sitting, chair;Supported;with cueing (comment type and amount) ADL Goal: Grooming - Progress: Progressing toward goals Pt Will Perform Upper Body Bathing: with mod assist;Sitting, chair;Supported;with cueing (comment type and amount) ADL Goal: Upper Body Bathing - Progress: Progressing toward goals Additional ADL Goal #1: Pt will complete bed mobility with mod A in preparation for ADL.  ADL Goal: Additional Goal #1 - Progress: Progressing toward goals Additional ADL Goal #2: Pt will sit EOB with min A supported in preparation for ADL ADL Goal: Additional Goal #2 - Progress: Progressing toward goals Arm Goals Pt Will Complete Theraputty Exer: to increase strength;Bilateral upper extremities;with minimal assist;Other (comment) Arm Goal: Theraputty Exercises - Progress: Progressing toward goal Additional Arm Goal #1: Wife will complete P/AAROM BUE with S Arm Goal: Additional Goal #1 - Progress: Progressing toward goals Additional Arm Goal #2: complete PNF D1/D2 patterns with LUE in preparation for ADL with min A in supine. Arm Goal: Additional Goal #2 - Progress: Progressing toward goals Miscellaneous OT Goals Miscellaneous OT Goal #1: Wife will complete edema control techniques with S R hand OT Goal: Miscellaneous Goal #1 - Progress: Met Miscellaneous OT Goal #2: Demonstrate functional grasp/release R hand with use of orthotic with min A  OT Goal: Miscellaneous Goal #2 - Progress: Progressing toward goals Miscellaneous OT Goal #3: Pt will tolerate r wrist cock up splint without c/o during the day. Wife indpenednt with donning/ doffing splint. OT Goal: Miscellaneous Goal #3 - Progress: Progressing toward goals Miscellaneous  OT Goal #4: Using left hand. feed self ice chips with set up OT Goal: Miscellaneous Goal #4 - Progress: Goal set  today  Visit Information  Last OT Received On: 04/11/12    Subjective Data      Prior Functioning       Cognition  Cognition Overall Cognitive Status: Impaired    Mobility       Exercises  General Exercises - Upper Extremity Shoulder Flexion: Both;20 reps;Supine;AAROM;Strengthening Shoulder ABduction: AAROM;Strengthening;Both;20 reps;Supine Elbow Flexion: AAROM;Right;10 reps;Supine;Strengthening;Left;20 reps Elbow Extension: Strengthening;Both;20 reps;Supine Wrist Flexion: Strengthening;Both;20 reps;Supine Wrist Extension: Left;10 reps;Strengthening;Right;AAROM;Supine Digit Composite Flexion: Squeeze ball;Both;Right;Left;Strengthening Composite Extension: Both;Other (comment) (extensor lag R) Other Exercises Other Exercises: theraputty exercises - soft/tan (L hand) Other Exercises: table top exercises. shoulder protraction/retraction and elbow flex/ext Other Exercises: PNF ex B UE D1/D2 patterns supine Other Exercises: squeeze ball - both   Balance     End of Session OT - End of Session Activity Tolerance: Patient tolerated treatment well Patient left: in bed;with call bell/phone within reach;with family/visitor present Nurse Communication: Mobility status  GO     Freja Faro,HILLARY 04/11/2012, 10:47 AM Luisa Dago, OTR/L  [phone] 04/11/2012

## 2012-04-11 NOTE — Progress Notes (Signed)
I met with pt's wife to discuss plans for inpt rehab once his tolerance with therapy has improved. We did not discuss timeline but I would anticipate over the next several days I can begin insurance approval. Noted pain issues with change in management, may improve his tolerance. [phone]

## 2012-04-11 NOTE — Progress Notes (Signed)
PULMONARY  / CRITICAL CARE MEDICINE  Name: Connor Small MRN: [id number] DOB: Sep 20, 1960    ADMISSION DATE:  02/26/2012 CONSULTATION DATE:  2/5  REFERRING MD :  Karilyn Cota  CHIEF COMPLAINT:  Acute resp failure   BRIEF PATIENT DESCRIPTION:  66 YOM admitted to [facility] for Cdiff on 2/2. Underwent decompressive cecostomy 2/3, initially was better, then worsened on 2/4 with increased abdominal distension and tenderness, decreased renal function.  He was taken back to the OR on 2/4 for total abdominal colectomy and end ileostomy.  Post op he developed ARDS and septic shock and was being treated for this.  The morning of 2/6 he had new onset of A-fib and transferred to [facility].   Course complicated by severe hypoalbuminemia, elevated LFTs  -cholestatic without biliary obstruction & pre-renal failure. Hypotensive 3/2 am requiring albumin  LINES / TUBES: ETT ([facility]) 2/4>>> 2/20 PICC ([facility]) 2/4>>>2/11 Art Line ([facility]) 2/5>>>out L IJ CVL 2/06 >> out A line R fem 2/18>>>2/24 Rt ij HD 2/17 >> out Left IJ 2/18 >>  ETT 3/3>>>out post op x 2  CULTURES: BCx2 2/4>>>negative BCx2 2/3>>>negative UC 2/3- Negative MRSA PCR 2/3- Negative C diff 2/6>>>Positive Body fluid 2/9>>>WBCs, no organisms 2/15 BC x 2 >> NEG 2/15 Sputum >>>NF 3/2 bc>>>NGTD 3/2 urine>>>neg 3/2 sputum>>>neg 3/5 cdiff>>>neg  PATH: 3/3 liver bx>>>BENIGN LIVER WITH CHOLESTASIS, The biopsy findings are that of extrahepatic biliary obstruction. No noted primary sclerosing cholangitis.  ANTIBIOTICS: Flagyl 2/3>> d/c'd Vanc 2/6 (IV) >>>2/7 azactam 2/6>>>2/7 Oral vanc 2/6>>>off IV vanc 2/16 >2/24 Aztreonam 2/16 >2/17 Imipenem 2/17>>>2/24 mycofungin 2/18>>>2/24  cipro 3/3>>>3/6 azactam 3/2>>>3/3 vanc 3/2>>>3/5 Flagyl 3/3>>>3/5   SIGNIFICANT EVENTS / STUDIES:  2/2 Admit to [facility] with C diff, toxic megacolon 2/3 Decompressive Cecostomy 2/4 Total abdominal colectomy and end ileostomy 2/6 New A-fib and transfer to [facility]  health. 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 2/10 CT abd/pelvis: small hematoma likely subcapsular spleen, JPs wnl 2/11 trial TFs  2/11 UE venous dopplers: Findings consistent with superficial vein thrombosis involving the right basilic vein 2/14 LE venous dopplers: No evidence of deep vein thrombosis 2/14 LUE venous doppler: No evidence of deep vein or superficial thrombosis involving the left upper extremity  2/15 neg 4 l bal on lasix drip, lasix d/c  2/16 poor neuro status, CT head: [facility] 2/17 CRRT initiated, high pressor needs 2/17 RUQ [place]: Gallbladder wall thickening in the absence of sludge or stones but no biliary ductal dilatation 2/18 reduced vasopressor reqts 2/19 New onset thrombocytopenia. Heparin D/C'd. Argatroban initiated 2/19 [place] abd (repeat): No explanation for elevated liver function tests 2/20 reduction pressors, improved alertness, extubated 2/20 LE venous dopplers: No evidence of deep vein or superficial thrombosis 2/21 bleeding overnight from JP's, argatroban turned off, lower pressor needs 2/22 off pressors, on RA, converted to NSR 2/22 Carotid dopplers: No significant extracranial carotid artery stenosis demonstrated 2/25 Cystoscopy with bilateral retrograde pyelograms. No extravasation of contrast from either ureteral orifice. No evidence of hydroureteronephrosis. Unremarkable urinary bladder. B ureteral stents placed 2/27 MRI brain: Scattered small acute to subacute infarcts in both cerebral hemispheres, brainstem, and right cerebellum. Favor sequelae of emboli from a cardiac or proximal aortic source. Main alternate consideration is hypotensive episode 2/27 MRCP: No intrahepatic bile duct dilatation or common bile duct dilatation. No gallstones identified. Large subcapsular fluid collection encases the spleen. This is of  indeterminate etiology.  This may represent a subacute to chronic subcapsular hematoma or abscess. 2/28 SLP eval: FEES performed. Oropharyngeal weakness noted. Recommend replacement of NGT to supplement PO intake 3/1 Renal signed off 3/3- bowel evisceration, to OR 3/3- no perf / abscess, wound vac open could not close, Liver BX, some oozing, post op resp failure 3/3 echo repeat - improved ef55%, poor windows 3/4- low dose pressor needs 3/4- extubated, high output again from JP's 3/6- OR trip, partial closure wound 3/7- renal fxn continues to improve 3/8- doppler legs >>>Prelim NEG 3/9- hypotension, volume given 3/9- closed, retention service 3/10- GI bleed, required Transfusion, hemodynamics improved, ppi drip 3/12- bili down 04/08/12 - Looks stable sitting up in chair.  No increased wob.  Still requiring tube feeds due to swallowing issues.  Surgery following wound.   SUBJECTIVE/OVERNIGHT/INTERVAL HX Nausea with oxycodone.  VITAL SIGNS: Temp:  [97.3 F (36.3 C)-97.8 F (36.6 C)] 97.3 F (36.3 C) (03/19 0726) Pulse Rate:  [101-115] 106 (03/19 1200) Resp:  [12-97] 15 (03/19 1200) BP: (94-116)/(60-89) 113/69 mmHg (03/19 1200) SpO2:  [99 %-100 %] 100 % (03/19 1200) Weight:  [84.1 kg (185 lb 6.5 oz)] 84.1 kg (185 lb 6.5 oz) (03/19 0300)  INTAKE / OUTPUT: Intake/Output     03/18 0701 - 03/19 0700 03/19 0701 - 03/20 0700   I.V. (mL/kg) 500 (5.9) 80 (1)   NG/GT 2640 255   Total Intake(mL/kg) 3140 (37.3) 335 (4)   Urine (mL/kg/hr) 3025 (1.5) 400 (0.8)   Stool 900 (0.4)    Total Output 3925 400   Net -785 -65          PHYSICAL EXAMINATION: General very deconditioned male. Icteric, no increased wob Neuro: follows commands well, moves all 4.  HEENT: Sclericterus, no TMG or LN Cardiovascular: regular mild tachy Lungs: CTA, good air entry, mild basilar crackles. Abdomen: dressing in place, bs + Musculoskeletal: no edema or cyanosis Skin:  No rash  LABS:  CBC    Component  Value Date/Time   WBC 4.1 04/11/2012 0330   RBC 2.60* 04/11/2012 0330   HGB 8.1* 04/11/2012 0330   HCT 26.8* 04/11/2012 0330   PLT 120* 04/11/2012 0330   MCV 103.1* 04/11/2012 0330   MCH 31.2 04/11/2012 0330   MCHC 30.2 04/11/2012 0330   RDW 22.6* 04/11/2012 0330   LYMPHSABS 0.3* 04/02/2012 1158   MONOABS 0.3 04/02/2012 1158   EOSABS 0.1 04/02/2012 1158   BASOSABS 0.0 04/02/2012 1158   Recent Labs Lab 04/10/12 1628 04/10/12 1917 04/10/12 2309 04/11/12 0301 04/11/12 0731  GLUCAP 113* 98 106* 107* 108*    Ct Cervical Spine Wo Contrast  04/09/2012  *RADIOLOGY REPORT*  Clinical Data: Diffuse neck pain after fall.  CT CERVICAL SPINE WITHOUT CONTRAST  Technique:  Multidetector CT imaging of the cervical spine was performed. Multiplanar CT image reconstructions were also generated.  Comparison: MRI brain 03/22/2012.  CT head 03/11/2012.  Findings: Mild degenerative changes in the cervical spine with narrowed interspaces at C3-4, C4-5, C5-6, and C6-7 levels.  There are associated endplate hypertrophic changes.  Mild reversal of the usual cervical lordosis which is likely due to the degenerative changes.  Degenerative changes in the facet joints.  No vertebral compression deformities.  No prevertebral soft tissue swelling.  No focal bone lesion or bone destruction.  There is subcutaneous emphysema predominately in the left neck involving the sternocleidomastoid muscles and extending across the midline over the laryngeal region.  There is some dissection around the jugular vein.  There is  a left central venous catheter in place in this emphysema may be related catheter insertion.  The visualized lung apices are limited due to motion artifact but there is no obvious pneumothorax.  There is a large left pleural effusion.  IMPRESSION: Degenerative changes in the cervical spine.  No displaced fractures are identified.  Subcutaneous emphysema in the left neck which may be related to a central venous catheter  insertion.  A large left pleural effusion is identified.   Original Report Authenticated By: Burman Nieves, M.D.    Dg Chest Port 1 View  04/09/2012  *RADIOLOGY REPORT*  Clinical Data: Right-sided pain after fall.  PORTABLE CHEST - 1 VIEW  Comparison: 04/07/2012  Findings: Enteric tube remains in place.  Left central venous catheter is unchanged in position.  Persistent left pleural effusion with infiltration or atelectasis in the left base.  This appears stable since previous study.  Minimal infiltration in the right lung bases somewhat improved.  No pneumothorax.  IMPRESSION: Stable appearance of left pleural effusion with infiltration or atelectasis in the left base.  Mild improvement of infiltration in the right base.   Original Report Authenticated By: Burman Nieves, M.D.    Imaging x 48 hours Ct Cervical Spine Wo Contrast  04/09/2012  *RADIOLOGY REPORT*  Clinical Data: Diffuse neck pain after fall.  CT CERVICAL SPINE WITHOUT CONTRAST  Technique:  Multidetector CT imaging of the cervical spine was performed. Multiplanar CT image reconstructions were also generated.  Comparison: MRI brain 03/22/2012.  CT head 03/11/2012.  Findings: Mild degenerative changes in the cervical spine with narrowed interspaces at C3-4, C4-5, C5-6, and C6-7 levels.  There are associated endplate hypertrophic changes.  Mild reversal of the usual cervical lordosis which is likely due to the degenerative changes.  Degenerative changes in the facet joints.  No vertebral compression deformities.  No prevertebral soft tissue swelling.  No focal bone lesion or bone destruction.  There is subcutaneous emphysema predominately in the left neck involving the sternocleidomastoid muscles and extending across the midline over the laryngeal region.  There is some dissection around the jugular vein.  There is a left central venous catheter in place in this emphysema may be related catheter insertion.  The visualized lung apices are limited  due to motion artifact but there is no obvious pneumothorax.  There is a large left pleural effusion.  IMPRESSION: Degenerative changes in the cervical spine.  No displaced fractures are identified.  Subcutaneous emphysema in the left neck which may be related to a central venous catheter insertion.  A large left pleural effusion is identified.   Original Report Authenticated By: Burman Nieves, M.D.    Dg Chest Port 1 View  04/09/2012  *RADIOLOGY REPORT*  Clinical Data: Right-sided pain after fall.  PORTABLE CHEST - 1 VIEW  Comparison: 04/07/2012  Findings: Enteric tube remains in place.  Left central venous catheter is unchanged in position.  Persistent left pleural effusion with infiltration or atelectasis in the left base.  This appears stable since previous study.  Minimal infiltration in the right lung bases somewhat improved.  No pneumothorax.  IMPRESSION: Stable appearance of left pleural effusion with infiltration or atelectasis in the left base.  Mild improvement of infiltration in the right base.   Original Report Authenticated By: Burman Nieves, M.D.     ASSESSMENT AND PLAN  RESPIRATORY No results found for this basename: PHART, PCO2, PCO2ART, PO2ART, HCO3, O2SAT,  in the last 168 hours A:  Pleural effusion, L > R Remaining effusion  but no distress P: - Continue aggressive pulmonary toilet - Push ambulation. - Physical therapy.  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 - 04/09/2012: Normal mental status P: Monitor  INFECTIOUS DISEASE  Recent Labs Lab 04/07/12 1920 04/08/12 0550 04/09/12 0500 04/10/12 0650 04/11/12 0330  WBC 3.7* 3.3* 3.5* 4.1 4.1  No results found for this basename: LATICACIDVEN, PROCALCITON,  in the last 168 hours  A:  Mild leukopenia P: Monitor  RENAL and electrolytes I/O last 3 completed shifts: In: 4905 [I.V.:730;  NG/GT:4175] Out: 4400 [Urine:3500; Stool:900]  Recent Labs Lab 04/06/12 0453 04/08/12 0550 04/09/12 0500 04/10/12 0650 04/11/12 0330  NA 147* 149* 152* 154* 152*  K 4.1 3.8 3.7 4.2 3.6  CL 117* 120* 123* 125* 122*  CO2 21 21 21 21 23   BUN 57* 61* 61* 60* 58*  CREATININE 1.40* 1.37* 1.30 1.27 1.27  CALCIUM 7.8* 8.0* 8.3* 8.5 8.3*  MG  --   --   --  1.9 1.9  PHOS  --   --   --  2.7 3.1   A:ARF (acute renal failure), Cr leveling off -?now CKD   Hypovolemia 3/9, ATN 3/10 from GI bleed - 04/09/2012: Creatinine is improving P: - Increased and continue free water for hypernatremia to 300 q6 hours. - 2 doses of lasix 20 mg. - Replace Mg. - AM labs.  HEMATOLOGIC  Recent Labs Lab 04/07/12 1920 04/08/12 0550 04/09/12 0500 04/10/12 0650 04/11/12 0330  HGB 8.5* 8.0* 8.2* 8.7* 8.1*  HCT 26.3* 24.7* 26.3* 28.1* 26.8*  PLT 103* 104* 113* 120* 120*  INR  --   --   --  1.06  --    A:  Thrombocytopenia, unspecified, HIT panel negative  Coagulopathy - likely due to hepatic dysfunction Anemia of critical illness P: - Back red cells for hemoglobin less than 7 g percent.  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 - Continue tube feeds. - Appreciate surgery involvement.  ENDOCRINE  Recent Labs Lab 04/10/12 1628 04/10/12 1917 04/10/12 2309 04/11/12 0301 04/11/12 0731  GLUCAP 113* 98 106* 107* 108*   A:  Nil acute P:  Monitor  DERMATOLOGY A: At risk for bed sores P: - Monitor  SYMPTOMS: Pain 04/09/2012: He  has significant pain that appears under treated. Wife not happy with current level of pain control. According to nursing he is looking at the clock every 2 hours for pain medication. The pain seems to be back and neuromuscular otherwise does not want to give him any muscular relaxants. He is happy with fentanyl. I will ensure that he gets fentanyl scheduled at night and as needed during the daytime and the wife is present at bedside in the daytime. As of the fentanyl patch we'll place a lidocaine patch in the shoulder where he has pain.  3/18 Persistent pain issues, will add q6 low dose roxanol to hopefully decrease the  amount of fentanyl IV uses.  3/19 Oxycodone resulted in N/V, will attempt methadone in its place at very low dose.  GLOBAL: 04/09/2012: Inpatient rehabilitation consultation requested  3/18 Rehab will not be able to take patient for at least another week.  Will hold in the ICU overnight.  Alyson Reedy, M.D. [facility] Pulmonary/Critical Care Medicine. Pager: [phone]. After hours pager: [phone].

## 2012-04-11 NOTE — Progress Notes (Signed)
Nurse contacted nutritionist Clovia Cuff to inform that patient has experienced recurrent heartburn.  Per wife " this didn't start until his tube feed was increased".  Currently pt tube feed is running at 85 (goal) with 0 residuals.  Nutritionist acknowledged nurse concerns and states she would assess current feeding plan.  Nurse will continue to monitor.

## 2012-04-11 NOTE — Progress Notes (Signed)
eLink Physician-Brief Progress Note Patient Name: Connor Small DOB: August 18, 1960 MRN: [id number]  Date of Service  04/11/2012   HPI/Events of Note   PT with ongoing emesis and nausea. Pt getting oxycodone and methadone together  eICU Interventions  D/c narcotics, check kub, give promethazine, he has maxed out zofran.    Intervention Category Minor Interventions: Routine modifications to care plan (e.g. PRN medications for pain, fever)  Asencion Noble 04/11/2012, 9:36 PM

## 2012-04-11 NOTE — Progress Notes (Addendum)
Pt vomited about 168m immediately after receiving his 10pm medications. Nurse and family were present at time of vomiting episoode. MD informed and orders received.

## 2012-04-12 ENCOUNTER — Inpatient Hospital Stay (HOSPITAL_COMMUNITY): Payer: BC Managed Care – PPO

## 2012-04-12 LAB — GLUCOSE, CAPILLARY
Glucose-Capillary: 80 mg/dL (ref 70–99)
Glucose-Capillary: 75 mg/dL (ref 70–99)
Glucose-Capillary: 81 mg/dL (ref 70–99)

## 2012-04-12 LAB — CBC
HCT: 27.5 % — ABNORMAL LOW (ref 39.0–52.0)
Hemoglobin: 8.6 g/dL — ABNORMAL LOW (ref 13.0–17.0)
MCH: 32.2 pg (ref 26.0–34.0)
MCHC: 31.3 g/dL (ref 30.0–36.0)

## 2012-04-12 LAB — BASIC METABOLIC PANEL
BUN: 58 mg/dL — ABNORMAL HIGH (ref 6–23)
Calcium: 8.5 mg/dL (ref 8.4–10.5)
Creatinine, Ser: 1.37 mg/dL — ABNORMAL HIGH (ref 0.50–1.35)
GFR calc non Af Amer: 57 mL/min — ABNORMAL LOW (ref >90)
Glucose, Bld: 94 mg/dL (ref 70–99)
Sodium: 155 mEq/L — ABNORMAL HIGH (ref 135–145)

## 2012-04-12 MED ORDER — MAGNESIUM SULFATE 40 MG/ML IJ SOLN
2.0000 g | Freq: Once | INTRAMUSCULAR | Status: AC
  Administered 2012-04-12: 2 g via INTRAVENOUS
  Filled 2012-04-12 (×2): qty 50

## 2012-04-12 MED ORDER — VITAL AF 1.2 CAL PO LIQD
1000.0000 mL | ORAL | Status: DC
  Administered 2012-04-12 – 2012-04-15 (×5): 1000 mL
  Filled 2012-04-12 (×7): qty 1000

## 2012-04-12 MED ORDER — METHADONE HCL 5 MG/5ML PO SOLN
5.0000 mg | Freq: Two times a day (BID) | ORAL | Status: DC
  Administered 2012-04-12: 5 mg
  Filled 2012-04-12: qty 5

## 2012-04-12 MED ORDER — ENSURE COMPLETE PO LIQD
237.0000 mL | Freq: Two times a day (BID) | ORAL | Status: DC
  Administered 2012-04-12 – 2012-04-17 (×11): 237 mL via ORAL

## 2012-04-12 NOTE — Progress Notes (Signed)
PULMONARY  / CRITICAL CARE MEDICINE  Name: Connor Small MRN: [id number] DOB: 07-27-1960    ADMISSION DATE:  02/26/2012 CONSULTATION DATE:  2/5  REFERRING MD :  Karilyn Cota  CHIEF COMPLAINT:  Acute resp failure   BRIEF PATIENT DESCRIPTION:  33 YOM admitted to [facility] for Cdiff on 2/2. Underwent decompressive cecostomy 2/3, initially was better, then worsened on 2/4 with increased abdominal distension and tenderness, decreased renal function.  He was taken back to the OR on 2/4 for total abdominal colectomy and end ileostomy.  Post op he developed ARDS and septic shock and was being treated for this.  The morning of 2/6 he had new onset of A-fib and transferred to [facility].   Course complicated by severe hypoalbuminemia, elevated LFTs  -cholestatic without biliary obstruction & pre-renal failure. Hypotensive 3/2 am requiring albumin  LINES / TUBES: ETT ([facility]) 2/4>>> 2/20 PICC ([facility]) 2/4>>>2/11 Art Line ([facility]) 2/5>>>out L IJ CVL 2/06 >> out A line R fem 2/18>>>2/24 Rt ij HD 2/17 >> out Left IJ 2/18 >>  ETT 3/3>>>out post op x 2  CULTURES: BCx2 2/4>>>negative BCx2 2/3>>>negative UC 2/3- Negative MRSA PCR 2/3- Negative C diff 2/6>>>Positive Body fluid 2/9>>>WBCs, no organisms 2/15 BC x 2 >> NEG 2/15 Sputum >>>NF 3/2 bc>>>NGTD 3/2 urine>>>neg 3/2 sputum>>>neg 3/5 cdiff>>>neg  PATH: 3/3 liver bx>>>BENIGN LIVER WITH CHOLESTASIS, The biopsy findings are that of extrahepatic biliary obstruction. No noted primary sclerosing cholangitis.  ANTIBIOTICS: Flagyl 2/3>> d/c'd Vanc 2/6 (IV) >>>2/7 azactam 2/6>>>2/7 Oral vanc 2/6>>>off IV vanc 2/16 >2/24 Aztreonam 2/16 >2/17 Imipenem 2/17>>>2/24 mycofungin 2/18>>>2/24  cipro 3/3>>>3/6 azactam 3/2>>>3/3 vanc 3/2>>>3/5 Flagyl 3/3>>>3/5   SIGNIFICANT EVENTS / STUDIES:  2/2 Admit to [facility] with C diff, toxic megacolon 2/3 Decompressive Cecostomy 2/4 Total abdominal colectomy and end ileostomy 2/6 New A-fib and transfer to [facility]  health. 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 2/10 CT abd/pelvis: small hematoma likely subcapsular spleen, JPs wnl 2/11 trial TFs  2/11 UE venous dopplers: Findings consistent with superficial vein thrombosis involving the right basilic vein 2/14 LE venous dopplers: No evidence of deep vein thrombosis 2/14 LUE venous doppler: No evidence of deep vein or superficial thrombosis involving the left upper extremity  2/15 neg 4 l bal on lasix drip, lasix d/c  2/16 poor neuro status, CT head: [facility] 2/17 CRRT initiated, high pressor needs 2/17 RUQ [place]: Gallbladder wall thickening in the absence of sludge or stones but no biliary ductal dilatation 2/18 reduced vasopressor reqts 2/19 New onset thrombocytopenia. Heparin D/C'd. Argatroban initiated 2/19 [place] abd (repeat): No explanation for elevated liver function tests 2/20 reduction pressors, improved alertness, extubated 2/20 LE venous dopplers: No evidence of deep vein or superficial thrombosis 2/21 bleeding overnight from JP's, argatroban turned off, lower pressor needs 2/22 off pressors, on RA, converted to NSR 2/22 Carotid dopplers: No significant extracranial carotid artery stenosis demonstrated 2/25 Cystoscopy with bilateral retrograde pyelograms. No extravasation of contrast from either ureteral orifice. No evidence of hydroureteronephrosis. Unremarkable urinary bladder. B ureteral stents placed 2/27 MRI brain: Scattered small acute to subacute infarcts in both cerebral hemispheres, brainstem, and right cerebellum. Favor sequelae of emboli from a cardiac or proximal aortic source. Main alternate consideration is hypotensive episode 2/27 MRCP: No intrahepatic bile duct dilatation or common bile duct dilatation. No gallstones identified. Large subcapsular fluid collection encases the spleen. This is of  indeterminate etiology.  This may represent a subacute to chronic subcapsular hematoma or abscess. 2/28 SLP eval: FEES performed. Oropharyngeal weakness noted. Recommend replacement of NGT to supplement PO intake 3/1 Renal signed off 3/3- bowel evisceration, to OR 3/3- no perf / abscess, wound vac open could not close, Liver BX, some oozing, post op resp failure 3/3 echo repeat - improved ef55%, poor windows 3/4- low dose pressor needs 3/4- extubated, high output again from JP's 3/6- OR trip, partial closure wound 3/7- renal fxn continues to improve 3/8- doppler legs >>>Prelim NEG 3/9- hypotension, volume given 3/9- closed, retention service 3/10- GI bleed, required Transfusion, hemodynamics improved, ppi drip 3/12- bili down 04/08/12 - Looks stable sitting up in chair.  No increased wob.  Still requiring tube feeds due to swallowing issues.  Surgery following wound.   SUBJECTIVE/OVERNIGHT/INTERVAL HX Nausea with oxycodone.  VITAL SIGNS: Temp:  [97.5 F (36.4 C)-98.5 F (36.9 C)] 97.9 F (36.6 C) (03/20 1130) Pulse Rate:  [99-116] 99 (03/20 1130) Resp:  [15-22] 15 (03/20 1130) BP: (96-112)/(61-71) 100/64 mmHg (03/20 1130) SpO2:  [97 %-100 %] 98 % (03/20 1130)  INTAKE / OUTPUT: Intake/Output     03/19 0701 - 03/20 0700 03/20 0701 - 03/21 0700   I.V. (mL/kg) 520 (6.2) 110 (1.3)   NG/GT 1570 40   Total Intake(mL/kg) 2090 (24.9) 150 (1.8)   Urine (mL/kg/hr) 3020 (1.5)    Stool 500 (0.2)    Total Output 3520     Net -1430 +150          PHYSICAL EXAMINATION: General very deconditioned male. Icteric, no increased wob Neuro: follows commands well, moves all 4.  HEENT: Sclericterus, no TMG or LN Cardiovascular: regular mild tachy Lungs: CTA, good air entry, mild basilar crackles. Abdomen: dressing in place, bs + Musculoskeletal: no edema or cyanosis Skin:  No rash  LABS:  CBC    Component Value Date/Time   WBC 5.0 04/12/2012 0435   RBC 2.67* 04/12/2012 0435   HGB 8.6*  04/12/2012 0435   HCT 27.5* 04/12/2012 0435   PLT 118* 04/12/2012 0435   MCV 103.0* 04/12/2012 0435   MCH 32.2 04/12/2012 0435   MCHC 31.3 04/12/2012 0435   RDW 21.7* 04/12/2012 0435   LYMPHSABS 0.3* 04/02/2012 1158   MONOABS 0.3 04/02/2012 1158   EOSABS 0.1 04/02/2012 1158   BASOSABS 0.0 04/02/2012 1158    Recent Labs Lab 04/11/12 2125 04/12/12 0033 04/12/12 0438 04/12/12 0743 04/12/12 1129  GLUCAP 128* 92 80 75 81    Dg Abd Portable 1v  04/12/2012  *RADIOLOGY REPORT*  Clinical Data: Panda tube placement.  PORTABLE ABDOMEN - 1 VIEW  Comparison: 04/11/2012.  Findings: The feeding tube tip is in the body region of the stomach.  The bowel gas pattern is unremarkable.  IMPRESSION: Feeding tube tip is in the body region of the stomach.   Original Report Authenticated By: Rudie Meyer, M.D.    Dg Abd Portable 1v  04/11/2012  *RADIOLOGY REPORT*  Clinical Data: Vomiting episodes.  Check NG tube position.  PORTABLE ABDOMEN - 1 VIEW  Comparison: 03/25/2012  Findings: Enteric tube tip is in the left upper quadrant consistent with location in the body of the stomach.  Interval removal of surgical drains from the abdomen.  Right lower quadrant ostomy. Paucity of gas in the bowel and colon.  Mild degenerative changes in the spine and hips.  IMPRESSION: Enteric tube tip is in the left upper quadrant consistent with location in the body of the stomach.  Paucity of gas in the remainder the abdomen.   Original Report Authenticated By: Burman Nieves, M.D.    Imaging x 48 hours Dg Abd Portable 1v  04/12/2012  *RADIOLOGY REPORT*  Clinical Data: Panda tube placement.  PORTABLE ABDOMEN - 1 VIEW  Comparison: 04/11/2012.  Findings: The feeding tube tip is in the body region of the stomach.  The bowel gas pattern is unremarkable.  IMPRESSION: Feeding tube tip is in the body region of the stomach.   Original Report Authenticated By: Rudie Meyer, M.D.    Dg Abd Portable 1v  04/11/2012  *RADIOLOGY REPORT*  Clinical  Data: Vomiting episodes.  Check NG tube position.  PORTABLE ABDOMEN - 1 VIEW  Comparison: 03/25/2012  Findings: Enteric tube tip is in the left upper quadrant consistent with location in the body of the stomach.  Interval removal of surgical drains from the abdomen.  Right lower quadrant ostomy. Paucity of gas in the bowel and colon.  Mild degenerative changes in the spine and hips.  IMPRESSION: Enteric tube tip is in the left upper quadrant consistent with location in the body of the stomach.  Paucity of gas in the remainder the abdomen.   Original Report Authenticated By: Burman Nieves, M.D.     ASSESSMENT AND PLAN  RESPIRATORY No results found for this basename: PHART, PCO2, PCO2ART, PO2ART, HCO3, O2SAT,  in the last 168 hours A:  Pleural effusion, L > R Remaining effusion but no distress P: - Continue aggressive pulmonary toilet - Push ambulation. - Physical therapy.  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 - 04/09/2012: Normal mental status P: Monitor  INFECTIOUS DISEASE  Recent Labs Lab 04/08/12 0550 04/09/12 0500 04/10/12 0650 04/11/12 0330 04/12/12 0435  WBC 3.3* 3.5* 4.1 4.1 5.0  No results found for this basename: LATICACIDVEN, PROCALCITON,  in the last 168 hours  A:  Mild leukopenia P: Monitor  RENAL and electrolytes I/O last 3 completed shifts: In: 3950 [I.V.:760; NG/GT:3190] Out: 6295 [Urine:5345; Stool:950]  Recent Labs Lab 04/08/12 0550 04/09/12 0500 04/10/12 0650 04/11/12 0330 04/12/12 0435  NA 149* 152* 154* 152* 155*  K 3.8 3.7 4.2 3.6 4.3  CL 120* 123* 125* 122* 122*  CO2 21 21 21 23 23   BUN 61* 61* 60* 58* 58*  CREATININE 1.37* 1.30 1.27 1.27 1.37*  CALCIUM 8.0* 8.3* 8.5 8.3* 8.5  MG  --   --  1.9 1.9 1.8  PHOS  --   --  2.7 3.1 3.0   A:ARF (acute renal failure), Cr leveling off -?now CKD   Hypovolemia 3/9, ATN 3/10 from GI  bleed - 04/09/2012: Creatinine is improving P: - Increased and continue free water for hypernatremia to 300 q6 hours. - Hold lasix for now. - Replace Mg. - AM labs.  HEMATOLOGIC  Recent Labs Lab 04/08/12 0550 04/09/12 0500 04/10/12 0650 04/11/12 0330 04/12/12 0435  HGB 8.0* 8.2* 8.7* 8.1* 8.6*  HCT 24.7* 26.3* 28.1* 26.8* 27.5*  PLT 104* 113* 120* 120* 118*  INR  --   --  1.06  --   --    A:  Thrombocytopenia, unspecified, HIT panel negative  Coagulopathy - likely due to hepatic dysfunction Anemia of critical illness P: - Back red cells for hemoglobin less than 7 g percent.  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 - Continue tube feeds. - Appreciate surgery involvement.  ENDOCRINE  Recent Labs Lab 04/11/12 2125 04/12/12 0033 04/12/12 0438 04/12/12 0743 04/12/12 1129  GLUCAP 128* 92 80 75 81   A:  Nil acute P:  Monitor  DERMATOLOGY A: At risk for bed sores P: - Monitor  SYMPTOMS: Pain 04/09/2012: He has significant pain that appears under treated. Wife not happy with current level of pain control. According to nursing he is looking at the clock every 2 hours for pain medication. The pain seems to be back and neuromuscular otherwise does not want to give him any muscular relaxants. He is happy with fentanyl. I will ensure that he gets fentanyl scheduled at night and as needed during the daytime and the wife is present at bedside in the daytime. As of the fentanyl patch we'll place a lidocaine patch in  the shoulder where he has pain.  3/18 Persistent pain issues, will add q6 low dose roxanol to hopefully decrease the amount of fentanyl IV uses.  3/19 Oxycodone resulted in N/V, will attempt methadone in its place at very low dose.  3/20 patient had some n/v with methadone but clearly pain has been better, I suspect that N/V were due to high volume of TF.  Patient passed swallow evaluation but his PO intake is simply too minor to sustain caloric intake.  Spoke with the family and patient, will continue with the methadone for now and decrease TF to 40 ml/hr.  Goal is to d/c TF all together once is able to full take diet PO.  Also complains of reflux.  Will double protonix but doubt will improve as long as NGT is in place.  PRN maalox.  GLOBAL: 04/09/2012: Inpatient rehabilitation consultation requested but likely will not be til next week.  Alyson Reedy, M.D. [facility] Pulmonary/Critical Care Medicine. Pager: [phone]. After hours pager: [phone].

## 2012-04-12 NOTE — Progress Notes (Signed)
I met with pt, his wife, and dtr at bedside. Then took wife and daughter on tour of the IP rehab/CIR unit in preparation for his eventual admit once medically ready and with insurance approval . Hopeful for next week. [phone]

## 2012-04-12 NOTE — Progress Notes (Signed)
Physical Therapy Treatment Patient Details Name: Connor Small MRN: [id number] DOB: 1960/10/24 Today's Date: 04/12/2012 Time: 0102-7253 PT Time Calculation (min): 53 min  PT Assessment / Plan / Recommendation Comments on Treatment Session  Patient participating more with transfers today and able to sit briefly without physical assist at edge of bed.  Feel was already fatigued prior to session due to eating lunch.  Feel will greatly benefit from interdisciplinary skilled therapy in CIR setting soon.    Follow Up Recommendations  CIR     Does the patient have the potential to tolerate intense rehabilitation   yes  Barriers to Discharge  NA      Equipment Recommendations  None recommended by PT    Recommendations for Other Services  None  Frequency Min 3X/week   Plan Discharge plan remains appropriate    Precautions / Restrictions Precautions Precautions: Fall Precaution Comments: Abd binder, ostomy, panda Required Braces or Orthoses: Other Brace/Splint Other Brace/Splint: R wrist cock up slint   Pertinent Vitals/Pain C/o 5/10 pain in arms end of session    Mobility  Bed Mobility Rolling Left: 1: +2 Total assist Rolling Left: Patient Percentage: 20% Left Sidelying to Sit: 1: +2 Total assist Left Sidelying to Sit: Patient Percentage: 10% Sitting - Scoot to Edge of Bed: 2: Max assist Details for Bed Mobility Assistance: required multimodal cues for segmental participation in mobility tasks Transfers Sit to Stand: 1: +2 Total assist;From elevated surface Sit to Stand: Patient Percentage: 20% Squat Pivot Transfers: 1: +2 Total assist Squat Pivot Transfers: Patient Percentage: 20% Details for Transfer Assistance: sit>stand practce from elevated bed with one on each side, able to lift hips briefly and support approx 20-30% of weight on feet; bed to chair with pivot on feet and pt able to assist with weight through feet today with specific verbal instructions to push through feet.     Exercises General Exercises - Lower Extremity Heel Slides: AAROM;Both;5 reps;Supine Other Exercises Other Exercises: head press into chair x 5 with 3 seconds hold (instructed wife to perform with patient) Other Exercises: seated scap retraction with chest protraction 2 reps with facilitation    PT Goals Acute Rehab PT Goals Pt will go Supine/Side to Sit: with +1 total assist PT Goal: Supine/Side to Sit - Progress: Progressing toward goal Pt will Sit at [facility] of Bed: 6-10 min;with unilateral upper extremity support;with min assist PT Goal: Sit at Edge Of Bed - Progress: Progressing toward goal Pt will go Sit to Stand: with +2 total assist (pt 30%) PT Goal: Sit to Stand - Progress: Progressing toward goal Pt will Transfer Bed to Chair/Chair to Bed: with +1 total assist PT Transfer Goal: Bed to Chair/Chair to Bed - Progress: Progressing toward goal Pt will Perform Home Exercise Program: with min assist PT Goal: Perform Home Exercise Program - Progress: Progressing toward goal  Visit Information  Last PT Received On: 04/12/12    Subjective Data  Subjective: What are we going to do?  Think lunch wore me out.   Cognition  Cognition Overall Cognitive Status: Impaired Area of Impairment: Attention;Following commands Arousal/Alertness: Awake/alert Behavior During Session: Anxious Current Attention Level: Sustained Attention - Other Comments: difficulty attending to session when needs to clear secretions. Following Commands: Follows one step commands with increased time    Insurance risk surveyor Sitting - Balance Support: Left upper extremity supported;Feet supported Static Sitting - Level of Assistance: 4: Min assist;3: Mod assist;5: Stand by assistance Static Sitting - Comment/# of  Minutes: sitting about 10 minutes while attempting to activate trunk and increase postural strength, awareness and endurance.  Faciliatation through mid thoracic spine for increased head  control and scapular retraction; did sit briefly about 10-15 seconds,) without physical assist.  End of Session PT - End of Session Equipment Utilized During Treatment: Other (comment) (abdominal binder) Patient left: in chair;with call bell/phone within reach   GP     [facility] 04/12/2012, 4:21 PM [place], [place] [phone] 04/12/2012

## 2012-04-12 NOTE — Progress Notes (Signed)
Speech Language Pathology Dysphagia Treatment Patient Details Name: Connor Small MRN: [id number] DOB: [birth year] Today's Date: 04/12/2012 Time: 1550-1610 SLP Time Calculation (min): 20 min  Assessment / Plan / Recommendation Clinical Impression  Pt seen for education, pharyngeal strengthening exercises, diet tolerance and instruction in diaphragmatic breathing. Pt, wife and dtr report pt did great with lunch today, ate 20% of meal. Appetite during session limited by reflux. Pt accepted three tastes of pudding thick liquids with one throat clear. Max verbal and visual instruction provided to instruct pt in diaphragmatic breathing and harder cough by accessing diaphragm. Pt will need reinforcment with this. Instructed wife in thickening to pudding. Will continue to follow.     Diet Recommendation  Continue with Current Diet: Pudding-thick liquid;Dysphagia 3 (mechanical soft)    SLP Plan Continue with current plan of care   Pertinent Vitals/Pain NA   Swallowing Goals  SLP Swallowing Goals Patient will utilize recommended strategies during swallow to increase swallowing safety with: Maximum assistance;Minimal cueing Swallow Study Goal #2 - Progress: Progressing toward goal Goal #3: Pt will complete base of tongue/pharyngeal/laryngeal strengthening exercises x10 each with moderate verbal cues.  Swallow Study Goal #3 - Progress: Progressing toward goal Goal #4: Pt will consume ice chips with swallow within 7 seconds of oral administration, 2 effortful swallows with max verbal cues.  Swallow Study Goal #4 - Progress: Progressing toward goal  General Temperature Spikes Noted: No Respiratory Status: Room air Behavior/Cognition: Alert;Cooperative;Pleasant mood Oral Cavity - Dentition: Adequate natural dentition Patient Positioning: Upright in chair  Oral Cavity - Oral Hygiene Does patient have any of the following "at risk" factors?: Nutritional status - inadequate Patient is AT RISK - Oral  Care Protocol followed (see row info): Yes   Dysphagia Treatment Treatment focused on: Facilitation of pharyngeal phase;Skilled observation of diet tolerance Family/Caregiver Educated: wife Treatment Methods/Modalities: Effortful swallow Patient observed directly with PO's: Yes Type of PO's observed: Pudding-thick liquids Liquids provided via: Teaspoon Pharyngeal Phase Signs & Symptoms: Immediate throat clear Type of cueing: Verbal;Tactile;Visual Amount of cueing: Minimal   GO    [name], MA CCC-SLP [phone]  [name] 04/12/2012, 4:17 PM

## 2012-04-12 NOTE — Progress Notes (Signed)
Patient ID: Connor Small, male   DOB: 03-07-1960, 52 y.o.   MRN: [id number] 11 Days Post-Op  Subjective: Pt passed swallow eval yesterday to soft diet and thickened liquids to pudding consistency.  Had some emesis over night after given meds.  TFs were held.  Objective: Vital signs in last 24 hours: Temp:  [97.5 F (36.4 C)-98.5 F (36.9 C)] 97.5 F (36.4 C) (03/20 0744) Pulse Rate:  [101-116] 105 (03/20 0744) Resp:  [15-22] 17 (03/20 0744) BP: (96-116)/(61-89) 101/63 mmHg (03/20 0744) SpO2:  [97 %-100 %] 100 % (03/20 0744) Last BM Date:  (t)  Intake/Output from previous day: 03/19 0701 - 03/20 0700 In: 2090 [I.V.:520; NG/GT:1570] Out: 3520 [Urine:3020; Stool:500] Intake/Output this shift:    PE: Abd: soft, ostomy with good output, wound is clean and intact.  Minimal fibrin at wound base.  Lab Results:   Recent Labs  04/11/12 0330 04/12/12 0435  WBC 4.1 5.0  HGB 8.1* 8.6*  HCT 26.8* 27.5*  PLT 120* 118*   BMET  Recent Labs  04/11/12 0330 04/12/12 0435  NA 152* 155*  K 3.6 4.3  CL 122* 122*  CO2 23 23  GLUCOSE 137* 94  BUN 58* 58*  CREATININE 1.27 1.37*  CALCIUM 8.3* 8.5   PT/INR  Recent Labs  04/10/12 0650  LABPROT 13.7  INR 1.06   CMP     Component Value Date/Time   NA 155* 04/12/2012 0435   K 4.3 04/12/2012 0435   CL 122* 04/12/2012 0435   CO2 23 04/12/2012 0435   GLUCOSE 94 04/12/2012 0435   BUN 58* 04/12/2012 0435   CREATININE 1.37* 04/12/2012 0435   CALCIUM 8.5 04/12/2012 0435   PROT 4.9* 04/10/2012 0650   ALBUMIN 1.7* 04/10/2012 0650   AST 48* 04/10/2012 0650   ALT 90* 04/10/2012 0650   ALKPHOS 695* 04/10/2012 0650   BILITOT 3.3* 04/10/2012 0650   GFRNONAA 57* 04/12/2012 0435   GFRAA 66* 04/12/2012 0435   Lipase     Component Value Date/Time   LIPASE 34 04/02/2012 0424       Studies/Results: Dg Abd Portable 1v  04/11/2012  *RADIOLOGY REPORT*  Clinical Data: Vomiting episodes.  Check NG tube position.  PORTABLE ABDOMEN - 1 VIEW   Comparison: 03/25/2012  Findings: Enteric tube tip is in the left upper quadrant consistent with location in the body of the stomach.  Interval removal of surgical drains from the abdomen.  Right lower quadrant ostomy. Paucity of gas in the bowel and colon.  Mild degenerative changes in the spine and hips.  IMPRESSION: Enteric tube tip is in the left upper quadrant consistent with location in the body of the stomach.  Paucity of gas in the remainder the abdomen.   Original Report Authenticated By: Burman Nieves, M.D.     Anti-infectives: Anti-infectives   Start     Dose/Rate Route Frequency Ordered Stop   04/01/12 1030  ciprofloxacin (CIPRO) IVPB 400 mg     400 mg 200 mL/hr over 60 Minutes Intravenous To Surgery 04/01/12 1028 04/01/12 1058   03/26/12 2100  vancomycin (VANCOCIN) IVPB 1000 mg/200 mL premix  Status:  Discontinued     1,000 mg 200 mL/hr over 60 Minutes Intravenous Every 24 hours 03/25/12 1934 03/28/12 0913   03/26/12 0945  ciprofloxacin (CIPRO) IVPB 400 mg  Status:  Discontinued     400 mg 200 mL/hr over 60 Minutes Intravenous Every 12 hours 03/26/12 0933 03/29/12 1331   03/26/12 0600  aztreonam (AZACTAM)  1 g in dextrose 5 % 50 mL IVPB  Status:  Discontinued     1 g 100 mL/hr over 30 Minutes Intravenous 3 times per day 03/25/12 1934 03/26/12 0929   03/25/12 2200  metroNIDAZOLE (FLAGYL) IVPB 500 mg  Status:  Discontinued     500 mg 100 mL/hr over 60 Minutes Intravenous Every 8 hours 03/25/12 1909 03/28/12 0913   03/25/12 2000  vancomycin (VANCOCIN) IVPB 1000 mg/200 mL premix     1,000 mg 200 mL/hr over 60 Minutes Intravenous STAT 03/25/12 1933 03/25/12 2054   03/25/12 2000  aztreonam (AZACTAM) 1 g in dextrose 5 % 50 mL IVPB     1 g 100 mL/hr over 30 Minutes Intravenous STAT 03/25/12 1933 03/25/12 2024   03/20/12 1145  ciprofloxacin (CIPRO) IVPB 400 mg    Comments:  ON CALL TO OR   400 mg 200 mL/hr over 60 Minutes Intravenous  Once 03/20/12 1143 03/20/12 1722   03/18/12  1000  vancomycin (VANCOCIN) 1,500 mg in sodium chloride 0.9 % 500 mL IVPB  Status:  Discontinued     1,500 mg 250 mL/hr over 120 Minutes Intravenous Every 48 hours 03/17/12 1920 03/19/12 0840   03/14/12 1000  micafungin (MYCAMINE) 100 mg in sodium chloride 0.9 % 100 mL IVPB  Status:  Discontinued     100 mg 100 mL/hr over 1 Hours Intravenous Daily 03/13/12 0632 03/19/12 0840   03/13/12 1000  micafungin (MYCAMINE) 100 mg in sodium chloride 0.9 % 100 mL IVPB     100 mg 100 mL/hr over 1 Hours Intravenous Daily 03/13/12 0632 03/13/12 1311   03/12/12 1800  vancomycin (VANCOCIN) IVPB 1000 mg/200 mL premix  Status:  Discontinued     1,000 mg 200 mL/hr over 60 Minutes Intravenous Every 24 hours 03/12/12 0859 03/17/12 1017   03/12/12 1000  imipenem-cilastatin (PRIMAXIN) 500 mg in sodium chloride 0.9 % 100 mL IVPB  Status:  Discontinued     500 mg 200 mL/hr over 30 Minutes Intravenous 3 times per day 03/12/12 0848 03/12/12 0909   03/12/12 1000  imipenem-cilastatin (PRIMAXIN) 250 mg in sodium chloride 0.9 % 100 mL IVPB  Status:  Discontinued     250 mg 200 mL/hr over 30 Minutes Intravenous 4 times per day 03/12/12 0909 03/19/12 0841   03/11/12 1200  vancomycin (VANCOCIN) 750 mg in sodium chloride 0.9 % 150 mL IVPB  Status:  Discontinued     750 mg 150 mL/hr over 60 Minutes Intravenous Every 12 hours 03/11/12 0204 03/12/12 0858   03/11/12 0215  vancomycin (VANCOCIN) 750 mg in sodium chloride 0.9 % 150 mL IVPB     750 mg 150 mL/hr over 60 Minutes Intravenous  Once 03/11/12 0204 03/11/12 0352   03/11/12 0215  aztreonam (AZACTAM) 1 g in dextrose 5 % 50 mL IVPB  Status:  Discontinued     1 g 100 mL/hr over 30 Minutes Intravenous 3 times per day 03/11/12 0204 03/12/12 0851   03/05/12 2200  metroNIDAZOLE (FLAGYL) IVPB 500 mg  Status:  Discontinued    Comments:  First dose ASAP   500 mg 100 mL/hr over 60 Minutes Intravenous Every 8 hours 03/05/12 1346 03/19/12 1509   03/02/12 1200  vancomycin (VANCOCIN)  50 mg/mL oral solution 500 mg  Status:  Discontinued     500 mg Oral 4 times per day 03/02/12 0913 03/19/12 1509   03/01/12 1445  vancomycin (VANCOCIN) 50 mg/mL oral solution 500 mg  Status:  Discontinued  500 mg Oral 3 times per day 03/01/12 1340 03/02/12 0913   03/01/12 1400  aztreonam (AZACTAM) 1 g in dextrose 5 % 50 mL IVPB  Status:  Discontinued     1 g 100 mL/hr over 30 Minutes Intravenous 3 times per day 03/01/12 1356 03/02/12 1146   03/01/12 1300  vancomycin (VANCOCIN) 1,250 mg in sodium chloride 0.9 % 250 mL IVPB  Status:  Discontinued     1,250 mg 166.7 mL/hr over 90 Minutes Intravenous Every 12 hours 03/01/12 1127 03/02/12 1146   02/29/12 0600  ciprofloxacin (CIPRO) IVPB 400 mg     400 mg 200 mL/hr over 60 Minutes Intravenous On call to O.R. 02/28/12 1409 02/28/12 1438   02/28/12 2200  vancomycin (VANCOCIN) 500 mg in sodium chloride irrigation 0.9 % 60 mL ENEMA  Status:  Discontinued     500 mg Rectal 2 times daily 02/28/12 1504 02/28/12 1817   02/28/12 1200  vancomycin (VANCOCIN) 50 mg/mL oral solution 500 mg  Status:  Discontinued    Comments:  First dose ASAP   500 mg Per Tube 4 times per day 02/28/12 0814 02/28/12 1817   02/28/12 1000  vancomycin (VANCOCIN) IVPB 1000 mg/200 mL premix  Status:  Discontinued     1,000 mg 200 mL/hr over 60 Minutes Intravenous Every 12 hours 02/28/12 0845 03/01/12 1127   02/28/12 1000  vancomycin (VANCOCIN) 500 mg in sodium chloride irrigation 0.9 % 100 mL ENEMA  Status:  Discontinued     500 mg Rectal 2 times daily 02/28/12 0923 02/28/12 1504   02/27/12 1600  metroNIDAZOLE (FLAGYL) IVPB 500 mg  Status:  Discontinued     500 mg 100 mL/hr over 60 Minutes Intravenous Every 8 hours 02/27/12 1546 02/27/12 2000   02/27/12 1600  ciprofloxacin (CIPRO) IVPB 400 mg  Status:  Discontinued     400 mg 200 mL/hr over 60 Minutes Intravenous Every 12 hours 02/27/12 1547 02/27/12 2000   02/27/12 1545  metroNIDAZOLE (FLAGYL) IVPB 500 mg  Status:   Discontinued     500 mg 100 mL/hr over 60 Minutes Intravenous  Once 02/27/12 1535 02/28/12 0843   02/27/12 1545  vancomycin (VANCOCIN) 500 mg in sodium chloride 0.9 % 100 mL IVPB  Status:  Discontinued     500 mg 100 mL/hr over 60 Minutes Intravenous  Once 02/27/12 1535 02/27/12 1538   02/27/12 1545  ciprofloxacin (CIPRO) IVPB 400 mg  Status:  Discontinued     400 mg 200 mL/hr over 60 Minutes Intravenous  Once 02/27/12 1538 02/28/12 0843   02/27/12 1415  vancomycin (VANCOCIN) 500 mg in sodium chloride 0.9 % 100 mL IVPB     500 mg 100 mL/hr over 60 Minutes Intravenous  Once 02/27/12 1402 02/27/12 1411   02/27/12 1400  vancomycin (VANCOCIN) powder 500 mg  Status:  Discontinued     500 mg Other To Surgery 02/27/12 1359 02/27/12 1402   02/27/12 1200  metroNIDAZOLE (FLAGYL) IVPB 500 mg  Status:  Discontinued    Comments:  First dose ASAP   500 mg 100 mL/hr over 60 Minutes Intravenous Every 6 hours 02/27/12 0953 03/05/12 1346   02/27/12 1200  vancomycin (VANCOCIN) 50 mg/mL oral solution 250 mg  Status:  Discontinued    Comments:  First dose ASAP   250 mg Oral 4 times per day 02/27/12 0953 02/28/12 0814   02/26/12 1700  oseltamivir (TAMIFLU) capsule 75 mg     75 mg Oral  Once 02/26/12 1651 02/26/12 1715  Assessment/Plan  1. S/p open abdomen, closed 2. S/p total abdominal colectomy 3. Dysphagia, improving  Plan: 1. Will dc NGT and place PANDA tube.  Since he is able to eat I will talk to nutrition to see if we can turn his TFs down to a lower rate to stimulate his appetite.  2. Cont routine wound and ostomy care.   LOS: 46 days    Tal Neer E 04/12/2012, 8:35 AM Pager: [phone]

## 2012-04-13 LAB — BASIC METABOLIC PANEL
CO2: 23 mEq/L (ref 19–32)
Calcium: 8.4 mg/dL (ref 8.4–10.5)
GFR calc non Af Amer: 62 mL/min — ABNORMAL LOW (ref >90)
Sodium: 154 mEq/L — ABNORMAL HIGH (ref 135–145)

## 2012-04-13 LAB — CBC
MCH: 31.7 pg (ref 26.0–34.0)
MCHC: 30.6 g/dL (ref 30.0–36.0)
Platelets: 125 10*3/uL — ABNORMAL LOW (ref 150–400)
RBC: 2.4 MIL/uL — ABNORMAL LOW (ref 4.22–5.81)

## 2012-04-13 LAB — MAGNESIUM: Magnesium: 2.3 mg/dL (ref 1.5–2.5)

## 2012-04-13 LAB — GLUCOSE, CAPILLARY
Glucose-Capillary: 83 mg/dL (ref 70–99)
Glucose-Capillary: 96 mg/dL (ref 70–99)

## 2012-04-13 LAB — PHOSPHORUS: Phosphorus: 3.3 mg/dL (ref 2.3–4.6)

## 2012-04-13 MED ORDER — METHADONE HCL 5 MG PO TABS
5.0000 mg | ORAL_TABLET | Freq: Two times a day (BID) | ORAL | Status: DC
  Administered 2012-04-13 – 2012-04-16 (×7): 5 mg
  Filled 2012-04-13 (×7): qty 1

## 2012-04-13 MED ORDER — PANTOPRAZOLE SODIUM 40 MG PO PACK
40.0000 mg | PACK | Freq: Two times a day (BID) | ORAL | Status: DC
  Administered 2012-04-13 – 2012-04-14 (×3): 40 mg
  Filled 2012-04-13 (×5): qty 20

## 2012-04-13 NOTE — Progress Notes (Signed)
Patient ID: Connor Small, male   DOB: [birth year], 52 y.o.   MRN: [id number] 12 Days Post-Op  Subjective: Pt ate some for breakfast and lunch yesterday.  Having trouble sleeping.  Wants chick-fil-a chicken today  Objective: Vital signs in last 24 hours: Temp:  [97.5 F (36.4 C)-98.2 F (36.8 C)] 97.9 F (36.6 C) (03/21 0400) Pulse Rate:  [91-123] 91 (03/21 0400) Resp:  [14-25] 15 (03/21 0400) BP: (99-108)/(57-71) 106/65 mmHg (03/21 0400) SpO2:  [92 %-100 %] 100 % (03/21 0400) Last BM Date: 04/12/12  Intake/Output from previous day: 03/20 0701 - 03/21 0700 In: 1825 [P.O.:75; I.V.:500; NG/GT:1200; IV Piggyback:50] Out: 1305 [Urine:355; Stool:950] Intake/Output this shift:    PE: Abd: soft, wound is healing well with good granulation, PANDA tube in place. Ostomy functioning well.  Lab Results:   Recent Labs  04/12/12 0435 04/13/12 0410  WBC 5.0 5.5  HGB 8.6* 7.6*  HCT 27.5* 24.8*  PLT 118* 125*   BMET  Recent Labs  04/12/12 0435 04/13/12 0410  NA 155* 154*  K 4.3 3.3*  CL 122* 121*  CO2 23 23  GLUCOSE 94 121*  BUN 58* 56*  CREATININE 1.37* 1.28  CALCIUM 8.5 8.4   PT/INR No results found for this basename: LABPROT, INR,  in the last 72 hours CMP     Component Value Date/Time   NA 154* 04/13/2012 0410   K 3.3* 04/13/2012 0410   CL 121* 04/13/2012 0410   CO2 23 04/13/2012 0410   GLUCOSE 121* 04/13/2012 0410   BUN 56* 04/13/2012 0410   CREATININE 1.28 04/13/2012 0410   CALCIUM 8.4 04/13/2012 0410   PROT 4.9* 04/10/2012 0650   ALBUMIN 1.7* 04/10/2012 0650   AST 48* 04/10/2012 0650   ALT 90* 04/10/2012 0650   ALKPHOS 695* 04/10/2012 0650   BILITOT 3.3* 04/10/2012 0650   GFRNONAA 62* 04/13/2012 0410   GFRAA 72* 04/13/2012 0410   Lipase     Component Value Date/Time   LIPASE 34 04/02/2012 0424       Studies/Results: Dg Abd Portable 1v  04/12/2012  *RADIOLOGY REPORT*  Clinical Data: Panda tube placement.  PORTABLE ABDOMEN - 1 VIEW  Comparison: 04/11/2012.   Findings: The feeding tube tip is in the body region of the stomach.  The bowel gas pattern is unremarkable.  IMPRESSION: Feeding tube tip is in the body region of the stomach.   Original Report Authenticated By: Rudie Meyer, M.D.    Dg Abd Portable 1v  04/11/2012  *RADIOLOGY REPORT*  Clinical Data: Vomiting episodes.  Check NG tube position.  PORTABLE ABDOMEN - 1 VIEW  Comparison: 03/25/2012  Findings: Enteric tube tip is in the left upper quadrant consistent with location in the body of the stomach.  Interval removal of surgical drains from the abdomen.  Right lower quadrant ostomy. Paucity of gas in the bowel and colon.  Mild degenerative changes in the spine and hips.  IMPRESSION: Enteric tube tip is in the left upper quadrant consistent with location in the body of the stomach.  Paucity of gas in the remainder the abdomen.   Original Report Authenticated By: Burman Nieves, M.D.     Anti-infectives: Anti-infectives   Start     Dose/Rate Route Frequency Ordered Stop   04/01/12 1030  ciprofloxacin (CIPRO) IVPB 400 mg     400 mg 200 mL/hr over 60 Minutes Intravenous To Surgery 04/01/12 1028 04/01/12 1058   03/26/12 2100  vancomycin (VANCOCIN) IVPB 1000 mg/200 mL premix  Status:  Discontinued     1,000 mg 200 mL/hr over 60 Minutes Intravenous Every 24 hours 03/25/12 1934 03/28/12 0913   03/26/12 0945  ciprofloxacin (CIPRO) IVPB 400 mg  Status:  Discontinued     400 mg 200 mL/hr over 60 Minutes Intravenous Every 12 hours 03/26/12 0933 03/29/12 1331   03/26/12 0600  aztreonam (AZACTAM) 1 g in dextrose 5 % 50 mL IVPB  Status:  Discontinued     1 g 100 mL/hr over 30 Minutes Intravenous 3 times per day 03/25/12 1934 03/26/12 0929   03/25/12 2200  metroNIDAZOLE (FLAGYL) IVPB 500 mg  Status:  Discontinued     500 mg 100 mL/hr over 60 Minutes Intravenous Every 8 hours 03/25/12 1909 03/28/12 0913   03/25/12 2000  vancomycin (VANCOCIN) IVPB 1000 mg/200 mL premix     1,000 mg 200 mL/hr over 60  Minutes Intravenous STAT 03/25/12 1933 03/25/12 2054   03/25/12 2000  aztreonam (AZACTAM) 1 g in dextrose 5 % 50 mL IVPB     1 g 100 mL/hr over 30 Minutes Intravenous STAT 03/25/12 1933 03/25/12 2024   03/20/12 1145  ciprofloxacin (CIPRO) IVPB 400 mg    Comments:  ON CALL TO OR   400 mg 200 mL/hr over 60 Minutes Intravenous  Once 03/20/12 1143 03/20/12 1722   03/18/12 1000  vancomycin (VANCOCIN) 1,500 mg in sodium chloride 0.9 % 500 mL IVPB  Status:  Discontinued     1,500 mg 250 mL/hr over 120 Minutes Intravenous Every 48 hours 03/17/12 1920 03/19/12 0840   03/14/12 1000  micafungin (MYCAMINE) 100 mg in sodium chloride 0.9 % 100 mL IVPB  Status:  Discontinued     100 mg 100 mL/hr over 1 Hours Intravenous Daily 03/13/12 0632 03/19/12 0840   03/13/12 1000  micafungin (MYCAMINE) 100 mg in sodium chloride 0.9 % 100 mL IVPB     100 mg 100 mL/hr over 1 Hours Intravenous Daily 03/13/12 0632 03/13/12 1311   03/12/12 1800  vancomycin (VANCOCIN) IVPB 1000 mg/200 mL premix  Status:  Discontinued     1,000 mg 200 mL/hr over 60 Minutes Intravenous Every 24 hours 03/12/12 0859 03/17/12 1017   03/12/12 1000  imipenem-cilastatin (PRIMAXIN) 500 mg in sodium chloride 0.9 % 100 mL IVPB  Status:  Discontinued     500 mg 200 mL/hr over 30 Minutes Intravenous 3 times per day 03/12/12 0848 03/12/12 0909   03/12/12 1000  imipenem-cilastatin (PRIMAXIN) 250 mg in sodium chloride 0.9 % 100 mL IVPB  Status:  Discontinued     250 mg 200 mL/hr over 30 Minutes Intravenous 4 times per day 03/12/12 0909 03/19/12 0841   03/11/12 1200  vancomycin (VANCOCIN) 750 mg in sodium chloride 0.9 % 150 mL IVPB  Status:  Discontinued     750 mg 150 mL/hr over 60 Minutes Intravenous Every 12 hours 03/11/12 0204 03/12/12 0858   03/11/12 0215  vancomycin (VANCOCIN) 750 mg in sodium chloride 0.9 % 150 mL IVPB     750 mg 150 mL/hr over 60 Minutes Intravenous  Once 03/11/12 0204 03/11/12 0352   03/11/12 0215  aztreonam (AZACTAM) 1 g  in dextrose 5 % 50 mL IVPB  Status:  Discontinued     1 g 100 mL/hr over 30 Minutes Intravenous 3 times per day 03/11/12 0204 03/12/12 0851   03/05/12 2200  metroNIDAZOLE (FLAGYL) IVPB 500 mg  Status:  Discontinued    Comments:  First dose ASAP   500 mg 100 mL/hr over 60 Minutes  Intravenous Every 8 hours 03/05/12 1346 03/19/12 1509   03/02/12 1200  vancomycin (VANCOCIN) 50 mg/mL oral solution 500 mg  Status:  Discontinued     500 mg Oral 4 times per day 03/02/12 0913 03/19/12 1509   03/01/12 1445  vancomycin (VANCOCIN) 50 mg/mL oral solution 500 mg  Status:  Discontinued     500 mg Oral 3 times per day 03/01/12 1340 03/02/12 0913   03/01/12 1400  aztreonam (AZACTAM) 1 g in dextrose 5 % 50 mL IVPB  Status:  Discontinued     1 g 100 mL/hr over 30 Minutes Intravenous 3 times per day 03/01/12 1356 03/02/12 1146   03/01/12 1300  vancomycin (VANCOCIN) 1,250 mg in sodium chloride 0.9 % 250 mL IVPB  Status:  Discontinued     1,250 mg 166.7 mL/hr over 90 Minutes Intravenous Every 12 hours 03/01/12 1127 03/02/12 1146   02/29/12 0600  ciprofloxacin (CIPRO) IVPB 400 mg     400 mg 200 mL/hr over 60 Minutes Intravenous On call to O.R. 02/28/12 1409 02/28/12 1438   02/28/12 2200  vancomycin (VANCOCIN) 500 mg in sodium chloride irrigation 0.9 % 60 mL ENEMA  Status:  Discontinued     500 mg Rectal 2 times daily 02/28/12 1504 02/28/12 1817   02/28/12 1200  vancomycin (VANCOCIN) 50 mg/mL oral solution 500 mg  Status:  Discontinued    Comments:  First dose ASAP   500 mg Per Tube 4 times per day 02/28/12 0814 02/28/12 1817   02/28/12 1000  vancomycin (VANCOCIN) IVPB 1000 mg/200 mL premix  Status:  Discontinued     1,000 mg 200 mL/hr over 60 Minutes Intravenous Every 12 hours 02/28/12 0845 03/01/12 1127   02/28/12 1000  vancomycin (VANCOCIN) 500 mg in sodium chloride irrigation 0.9 % 100 mL ENEMA  Status:  Discontinued     500 mg Rectal 2 times daily 02/28/12 0923 02/28/12 1504   02/27/12 1600  metroNIDAZOLE  (FLAGYL) IVPB 500 mg  Status:  Discontinued     500 mg 100 mL/hr over 60 Minutes Intravenous Every 8 hours 02/27/12 1546 02/27/12 2000   02/27/12 1600  ciprofloxacin (CIPRO) IVPB 400 mg  Status:  Discontinued     400 mg 200 mL/hr over 60 Minutes Intravenous Every 12 hours 02/27/12 1547 02/27/12 2000   02/27/12 1545  metroNIDAZOLE (FLAGYL) IVPB 500 mg  Status:  Discontinued     500 mg 100 mL/hr over 60 Minutes Intravenous  Once 02/27/12 1535 02/28/12 0843   02/27/12 1545  vancomycin (VANCOCIN) 500 mg in sodium chloride 0.9 % 100 mL IVPB  Status:  Discontinued     500 mg 100 mL/hr over 60 Minutes Intravenous  Once 02/27/12 1535 02/27/12 1538   02/27/12 1545  ciprofloxacin (CIPRO) IVPB 400 mg  Status:  Discontinued     400 mg 200 mL/hr over 60 Minutes Intravenous  Once 02/27/12 1538 02/28/12 0843   02/27/12 1415  vancomycin (VANCOCIN) 500 mg in sodium chloride 0.9 % 100 mL IVPB     500 mg 100 mL/hr over 60 Minutes Intravenous  Once 02/27/12 1402 02/27/12 1411   02/27/12 1400  vancomycin (VANCOCIN) powder 500 mg  Status:  Discontinued     500 mg Other To Surgery 02/27/12 1359 02/27/12 1402   02/27/12 1200  metroNIDAZOLE (FLAGYL) IVPB 500 mg  Status:  Discontinued    Comments:  First dose ASAP   500 mg 100 mL/hr over 60 Minutes Intravenous Every 6 hours 02/27/12 0953 03/05/12 1346  02/27/12 1200  vancomycin (VANCOCIN) 50 mg/mL oral solution 250 mg  Status:  Discontinued    Comments:  First dose ASAP   250 mg Oral 4 times per day 02/27/12 0953 02/28/12 0814   02/26/12 1700  oseltamivir (TAMIFLU) capsule 75 mg     75 mg Oral  Once 02/26/12 1651 02/26/12 1715       Assessment/Plan  1. S/p open abdomen with closure. 2. Dysphagia, TF  Plan: 1. Continue current would care. 2. Cont TFs at 40cc/hr cyclically from 8p-8a.  As caloric intake goes up then hopefully these TFs can be weaned to off and PANDA removed. 3. Will make a prn for the WE and see on Monday.  Please call if needed sooner.    LOS: 47 days    OSBORNE,KELLY E 04/13/2012, 8:24 AM Pager: [phone]  He looks good today.  Had chicken sandwich today now eating ice cream.  No new issues.

## 2012-04-13 NOTE — Consult Note (Signed)
WOC follow-up: Returned to the room with teaching materials.  Wife at bedside and discussed pouching routines, products, and emptying, and topical care for partial thickness wound to abd.  Teaching materials left at bedside.  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. Reviewed differences in ileostomy regarding dietary precautions, output consistency and acidic characteristics, and  Need to replace fluids.  Plan to perform pouch change demonstration on Monday at 0900 as requested by wife. Julien Girt MSN, RN, [place], [place], [place]

## 2012-04-13 NOTE — Consult Note (Addendum)
WOC ostomy consult  Stoma type/location: Initial consult requested for ileostomy which was performed on 02/28/12.  Stoma intact to right lower quad. Stomal assessment/size: Stoma red and viable, flush with skin level, 1 inch, some mucocutaneous seperation occurring to peristomal edges.  Peristomal assessment: Denuded area to right of ostomy pouching area, 5X3X.1cm partial thickness wound, 100% red and moist. Treatment options for stomal/peristomal skin: Foam dressing applied to protect and absorb drainage to promote healing. Output  50cc liquid brown stool. Ostomy pouching: 2pc.  Education provided: No family present in room.  Pt obtunded and does not appear to understand pouch change demonstration. Will need continued total assistance with pouch application and emptying wherever he is transferred to after hospital stay. Applied barrier ring to maintain seal and 2 piece pouch.  Supplies ordered to bedside for staff use.  Julien Girt MSN, RN, [place], [place], [place]

## 2012-04-13 NOTE — Progress Notes (Signed)
Occupational Therapy Treatment Patient Details Name: Connor Small MRN: [id number] DOB: [birth year] Today's Date: 04/13/2012 Time: 4098-1191 OT Time Calculation (min): 48 min  OT Assessment / Plan / Recommendation Comments on Treatment Session Excellent session. Pt continues to improve his static sitting balance,core/trunk strength, BUE strength and beginning to engage inADL. Appropriate rehab/CIR candidate.    Follow Up Recommendations  CIR    Barriers to Discharge       Equipment Recommendations  3 in 1 bedside comode;Tub/shower bench    Recommendations for Other Services Rehab consult  Frequency Min 3X/week   Plan Discharge plan remains appropriate    Precautions / Restrictions Precautions Precautions: Fall Precaution Comments: Abd binder, ostomy, panda Required Braces or Orthoses: Other Brace/Splint Other Brace/Splint: R wrist cock up slint   Pertinent Vitals/Pain stable    ADL  Eating/Feeding: Maximal assistance (hand over hand using L hand) Where Assessed - Eating/Feeding: Chair Grooming: Maximal assistance Where Assessed - Grooming: Supported sitting (combing hair with L hand. Hand over hand) Transfers/Ambulation Related to ADLs: +2 total A. Using NDT over the back technique ADL Comments: Educating wife onhaving her husband do basic ADL using hand over hand    OT Diagnosis:    OT Problem List:   OT Treatment Interventions:     OT Goals Acute Rehab OT Goals OT Goal Formulation: With patient Time For Goal Achievement: 04/16/12 Potential to Achieve Goals: Good ADL Goals Pt Will Perform Grooming: with mod assist;Sitting, chair;Supported;with cueing (comment type and amount) ADL Goal: Grooming - Progress: Progressing toward goals Pt Will Perform Upper Body Bathing: with mod assist;Sitting, chair;Supported;with cueing (comment type and amount) ADL Goal: Upper Body Bathing - Progress: Progressing toward goals Additional ADL Goal #1: Pt will complete bed mobility  with mod A in preparation for ADL.  ADL Goal: Additional Goal #1 - Progress: Progressing toward goals Additional ADL Goal #2: Pt will sit EOB with min A supported in preparation for ADL ADL Goal: Additional Goal #2 - Progress: Progressing toward goals Arm Goals Pt Will Complete Theraputty Exer: to increase strength;Bilateral upper extremities;with minimal assist;Other (comment) Arm Goal: Theraputty Exercises - Progress: Progressing toward goal Additional Arm Goal #1: Wife will complete P/AAROM BUE with S Arm Goal: Additional Goal #1 - Progress: Progressing toward goals Additional Arm Goal #2: complete PNF D1/D2 patterns with LUE in preparation for ADL with min A in supine. Arm Goal: Additional Goal #2 - Progress: Progressing toward goals Miscellaneous OT Goals Miscellaneous OT Goal #1: Wife will complete edema control techniques with S R hand OT Goal: Miscellaneous Goal #1 - Progress: Met Miscellaneous OT Goal #2: Demonstrate functional grasp/release R hand with use of orthotic with min A  OT Goal: Miscellaneous Goal #2 - Progress: Progressing toward goals Miscellaneous OT Goal #3: Pt will tolerate r wrist cock up splint without c/o during the day. Wife indpenednt with donning/ doffing splint. OT Goal: Miscellaneous Goal #3 - Progress: Met Miscellaneous OT Goal #4: Using left hand. feed self ice chips with set up OT Goal: Miscellaneous Goal #4 - Progress: Progressing toward goals  Visit Information  Last OT Received On: 04/13/12 Assistance Needed: +2    Subjective Data      Prior Functioning       Cognition  Cognition Overall Cognitive Status: Impaired Area of Impairment: Attention;Problem solving Arousal/Alertness: Awake/alert Behavior During Session: Anxious Current Attention Level: Selective Following Commands: Follows one step commands consistently Problem Solving: Mod A    Mobility  Bed Mobility Bed Mobility: Supine to Sit  Rolling Right: 1: +1 Total assist Supine to  Sit: 1: +1 Total assist Details for Bed Mobility Assistance: Pt @ 20% Transfers Details for Transfer Assistance: squat pivot total A using over the back technique with chuck    Exercises  General Exercises - Upper Extremity Shoulder Flexion: Both;20 reps;Supine;AAROM;Strengthening Shoulder ABduction: AAROM;Strengthening;Both;20 reps;Supine Elbow Flexion: AAROM;Right;10 reps;Supine;Strengthening;Left;20 reps Elbow Extension: Strengthening;Both;20 reps;Supine Wrist Flexion: Strengthening;Both;20 reps;Supine Wrist Extension: Left;10 reps;Strengthening;Right;AAROM;Supine Digit Composite Flexion: Squeeze ball;Both;Right;Left;Strengthening Composite Extension: Both;Other (comment) Other Exercises Other Exercises: head press into chair x 5 with 3 seconds hold (instructed wife to perform with patient) Other Exercises: seated scap retraction with chest protraction 2 reps with facilitation Other Exercises: table top exercises. shoulder protraction/retraction and elbow flex/ext Other Exercises: PNF ex B UE D1/D2 patterns supine Other Exercises: squeeze ball - both   Balance Static Sitting Balance Static Sitting - Balance Support: Bilateral upper extremity supported;Feet supported Static Sitting - Level of Assistance: 5: Stand by assistance Static Sitting - Comment/# of Minutes: 10 Dynamic Sitting Balance Dynamic Sitting - Balance Support: Feet supported;Left upper extremity supported;During functional activity   End of Session OT - End of Session Equipment Utilized During Treatment: Gait belt Activity Tolerance: Patient tolerated treatment well Patient left: in chair;with call bell/phone within reach;with family/visitor present Nurse Communication: Mobility status;Other (comment) (Use lift to get pt back into chair)  GO     Brnadon Eoff,HILLARY 04/13/2012, 4:58 PM [facility], OTR/L  [phone] 04/13/2012

## 2012-04-13 NOTE — Progress Notes (Signed)
Per attending MD, request to pursue insurance approval for admit to inpt rehab Monday. I will begin today and await their decision for IP rehab vs SNF. [phone]

## 2012-04-13 NOTE — Progress Notes (Signed)
PULMONARY  / CRITICAL CARE MEDICINE  Name: Connor Small MRN: [id number] DOB: Sep 23, 1960    ADMISSION DATE:  02/26/2012 CONSULTATION DATE:  2/5  REFERRING MD :  Karilyn Cota  CHIEF COMPLAINT:  Acute resp failure   BRIEF PATIENT DESCRIPTION:  55 YOM admitted to [facility] for Cdiff on 2/2. Underwent decompressive cecostomy 2/3, initially was better, then worsened on 2/4 with increased abdominal distension and tenderness, decreased renal function.  He was taken back to the OR on 2/4 for total abdominal colectomy and end ileostomy.  Post op he developed ARDS and septic shock and was being treated for this.  The morning of 2/6 he had new onset of A-fib and transferred to [facility].   Course complicated by severe hypoalbuminemia, elevated LFTs  -cholestatic without biliary obstruction & pre-renal failure. Hypotensive 3/2 am requiring albumin  LINES / TUBES: ETT ([facility]) 2/4>>> 2/20 PICC ([facility]) 2/4>>>2/11 Art Line ([facility]) 2/5>>>out L IJ CVL 2/06 >> out A line R fem 2/18>>>2/24 Rt ij HD 2/17 >> out Left IJ 2/18 >>  ETT 3/3>>>out post op x 2  CULTURES: BCx2 2/4>>>negative BCx2 2/3>>>negative UC 2/3- Negative MRSA PCR 2/3- Negative C diff 2/6>>>Positive Body fluid 2/9>>>WBCs, no organisms 2/15 BC x 2 >> NEG 2/15 Sputum >>>NF 3/2 bc>>>NGTD 3/2 urine>>>neg 3/2 sputum>>>neg 3/5 cdiff>>>neg  PATH: 3/3 liver bx>>>BENIGN LIVER WITH CHOLESTASIS, The biopsy findings are that of extrahepatic biliary obstruction. No noted primary sclerosing cholangitis.  ANTIBIOTICS: Flagyl 2/3>> d/c'd Vanc 2/6 (IV) >>>2/7 azactam 2/6>>>2/7 Oral vanc 2/6>>>off IV vanc 2/16 >2/24 Aztreonam 2/16 >2/17 Imipenem 2/17>>>2/24 mycofungin 2/18>>>2/24  cipro 3/3>>>3/6 azactam 3/2>>>3/3 vanc 3/2>>>3/5 Flagyl 3/3>>>3/5   SIGNIFICANT EVENTS / STUDIES:  2/2 Admit to [facility] with C diff, toxic megacolon 2/3 Decompressive Cecostomy 2/4 Total abdominal colectomy and end ileostomy 2/6 New A-fib and transfer to [facility]  health. 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 2/10 CT abd/pelvis: small hematoma likely subcapsular spleen, JPs wnl 2/11 trial TFs  2/11 UE venous dopplers: Findings consistent with superficial vein thrombosis involving the right basilic vein 2/14 LE venous dopplers: No evidence of deep vein thrombosis 2/14 LUE venous doppler: No evidence of deep vein or superficial thrombosis involving the left upper extremity  2/15 neg 4 l bal on lasix drip, lasix d/c  2/16 poor neuro status, CT head: [facility] 2/17 CRRT initiated, high pressor needs 2/17 RUQ [place]: Gallbladder wall thickening in the absence of sludge or stones but no biliary ductal dilatation 2/18 reduced vasopressor reqts 2/19 New onset thrombocytopenia. Heparin D/C'd. Argatroban initiated 2/19 [place] abd (repeat): No explanation for elevated liver function tests 2/20 reduction pressors, improved alertness, extubated 2/20 LE venous dopplers: No evidence of deep vein or superficial thrombosis 2/21 bleeding overnight from JP's, argatroban turned off, lower pressor needs 2/22 off pressors, on RA, converted to NSR 2/22 Carotid dopplers: No significant extracranial carotid artery stenosis demonstrated 2/25 Cystoscopy with bilateral retrograde pyelograms. No extravasation of contrast from either ureteral orifice. No evidence of hydroureteronephrosis. Unremarkable urinary bladder. B ureteral stents placed 2/27 MRI brain: Scattered small acute to subacute infarcts in both cerebral hemispheres, brainstem, and right cerebellum. Favor sequelae of emboli from a cardiac or proximal aortic source. Main alternate consideration is hypotensive episode 2/27 MRCP: No intrahepatic bile duct dilatation or common bile duct dilatation. No gallstones identified. Large subcapsular fluid collection encases the spleen. This is of  indeterminate etiology.  This may represent a subacute to chronic subcapsular hematoma or abscess. 2/28 SLP eval: FEES performed. Oropharyngeal weakness noted. Recommend replacement of NGT to supplement PO intake 3/1 Renal signed off 3/3- bowel evisceration, to OR 3/3- no perf / abscess, wound vac open could not close, Liver BX, some oozing, post op resp failure 3/3 echo repeat - improved ef55%, poor windows 3/4- low dose pressor needs 3/4- extubated, high output again from JP's 3/6- OR trip, partial closure wound 3/7- renal fxn continues to improve 3/8- doppler legs >>>Prelim NEG 3/9- hypotension, volume given 3/9- closed, retention service 3/10- GI bleed, required Transfusion, hemodynamics improved, ppi drip 3/12- bili down 04/08/12 - Looks stable sitting up in chair.  No increased wob.  Still requiring tube feeds due to swallowing issues.  Surgery following wound. 04/12/2012:Nausea with oxycodone. Started on by mouth methadone  SUBJECTIVE/OVERNIGHT/INTERVAL HX 04/13/2012: Overnight had no requirements or less after starting by mouth methadone yesterday. Main issue is significant heartburn despite doubling of Protonix and Maalox when necessary. Wife reports significant anxiety in part of the patient but she does not want to "medicate" him. Nursing note is stage II skin tear near the ostomy right lower quadrant  VITAL SIGNS: Temp:  [97.5 F (36.4 C)-98.3 F (36.8 C)] 98.3 F (36.8 C) (03/21 1200) Pulse Rate:  [89-123] 89 (03/21 1200) Resp:  [15-25] 15 (03/21 1200) BP: (99-106)/(57-71) 101/64 mmHg (03/21 1200) SpO2:  [92 %-100 %] 100 % (03/21 1200)  INTAKE / OUTPUT: Intake/Output     03/20 0701 - 03/21 0700 03/21 0701 - 03/22 0700   P.O. 75    I.V. (mL/kg) 500 (5.9) 100 (1.2)   NG/GT 1200 0   IV Piggyback 50    Total Intake(mL/kg) 1825 (21.7) 100 (1.2)   Urine (mL/kg/hr) 355 (0.2) 200 (0.4)   Stool 950 (0.5)    Total Output 1305 200   Net +520 -100          PHYSICAL  EXAMINATION: General very deconditioned male. Icteric, no increased wob Neuro: follows commands well, moves all 4.  HEENT: Sclericterus, no TMG or LN. Oral cavity has? Thrush versus? Yogurt Cardiovascular: regular mild tachy Lungs: CTA, good air entry, mild basilar crackles. Abdomen: dressing in place, bs + Musculoskeletal: no edema or cyanosis Skin:  No rash but has 5 cm stage II skin also to the right of the ostomy in the right lower quadrant  LABS:  CBC    Component Value Date/Time   WBC 5.5 04/13/2012 0410   RBC 2.40* 04/13/2012 0410   HGB 7.6* 04/13/2012 0410   HCT 24.8* 04/13/2012 0410   PLT 125* 04/13/2012 0410   MCV 103.3* 04/13/2012 0410   MCH 31.7 04/13/2012 0410   MCHC 30.6 04/13/2012 0410   RDW 20.7* 04/13/2012 0410   LYMPHSABS 0.3* 04/02/2012 1158   MONOABS 0.3 04/02/2012 1158   EOSABS 0.1 04/02/2012 1158   BASOSABS 0.0 04/02/2012 1158    Recent Labs Lab 04/12/12 1129 04/12/12 1556 04/12/12 2006 04/12/12 2347 04/13/12 0429  GLUCAP 81 86 97 108* 105*    Dg Abd Portable 1v  04/12/2012  *RADIOLOGY REPORT*  Clinical Data: Panda tube placement.  PORTABLE ABDOMEN - 1 VIEW  Comparison: 04/11/2012.  Findings: The feeding tube tip is in the body region of the stomach.  The bowel gas pattern is unremarkable.  IMPRESSION: Feeding tube tip is in the body region of the stomach.   Original Report Authenticated By: Rudie Meyer, M.D.    Dg Abd Portable 1v  04/11/2012  *RADIOLOGY REPORT*  Clinical Data: Vomiting episodes.  Check NG tube position.  PORTABLE ABDOMEN - 1 VIEW  Comparison: 03/25/2012  Findings: Enteric tube tip is in the left upper quadrant consistent with location in the body of the stomach.  Interval removal of surgical drains from the abdomen.  Right lower quadrant ostomy. Paucity of gas in the bowel and colon.  Mild degenerative changes in the spine and hips.  IMPRESSION: Enteric tube tip is in the left upper quadrant consistent with location in the body of the stomach.   Paucity of gas in the remainder the abdomen.   Original Report Authenticated By: Burman Nieves, M.D.    Imaging x 48 hours Dg Abd Portable 1v  04/12/2012  *RADIOLOGY REPORT*  Clinical Data: Panda tube placement.  PORTABLE ABDOMEN - 1 VIEW  Comparison: 04/11/2012.  Findings: The feeding tube tip is in the body region of the stomach.  The bowel gas pattern is unremarkable.  IMPRESSION: Feeding tube tip is in the body region of the stomach.   Original Report Authenticated By: Rudie Meyer, M.D.    Dg Abd Portable 1v  04/11/2012  *RADIOLOGY REPORT*  Clinical Data: Vomiting episodes.  Check NG tube position.  PORTABLE ABDOMEN - 1 VIEW  Comparison: 03/25/2012  Findings: Enteric tube tip is in the left upper quadrant consistent with location in the body of the stomach.  Interval removal of surgical drains from the abdomen.  Right lower quadrant ostomy. Paucity of gas in the bowel and colon.  Mild degenerative changes in the spine and hips.  IMPRESSION: Enteric tube tip is in the left upper quadrant consistent with location in the body of the stomach.  Paucity of gas in the remainder the abdomen.   Original Report Authenticated By: Burman Nieves, M.D.     ASSESSMENT AND PLAN  RESPIRATORY No results found for this basename: PHART, PCO2, PCO2ART, PO2ART, HCO3, O2SAT,  in the last 168 hours A:  Pleural effusion, L > R Remaining effusion but no distress P: - Continue aggressive pulmonary toilet - Push ambulation. - Physical therapy.  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 - 04/13/2012: Normal mental status but has significant chronic multi-site pain P: Continue methadone 5 mg twice a day started 04/13/2012 Titrate methadone only every 5 days because of prolonged half-life Check QTc interval periodically while on methadone Use  fentanyl when necessary  INFECTIOUS DISEASE  Recent Labs Lab 04/09/12 0500 04/10/12 0650 04/11/12 0330 04/12/12 0435 04/13/12 0410  WBC 3.5* 4.1 4.1 5.0 5.5  No results found for this basename: LATICACIDVEN, PROCALCITON,  in the last 168 hours  A:  Mild leukopenia P: Monitor  RENAL and electrolytes I/O last 3 completed shifts: In: 2635 [P.O.:75; I.V.:760; NG/GT:1750; IV Piggyback:50] Out: 3075 [Urine:1825; Stool:1250]  Recent Labs Lab 04/09/12 0500 04/10/12 0650 04/11/12 0330 04/12/12 0435 04/13/12 0410  NA 152* 154* 152* 155* 154*  K 3.7 4.2 3.6 4.3 3.3*  CL 123* 125* 122* 122* 121*  CO2 21 21 23 23 23   BUN 61* 60* 58* 58* 56*  CREATININE 1.30 1.27 1.27 1.37* 1.28  CALCIUM 8.3* 8.5 8.3* 8.5 8.4  MG  --  1.9 1.9 1.8 2.3  PHOS  --  2.7 3.1 3.0 3.3   A:ARF (acute renal failure), Cr leveling off -?now CKD  Hypovolemia 3/9, ATN 3/10 from GI bleed - 04/13/2012: Creatinine is improving P: - Increased and continue free water for hypernatremia to 300 q6 hours. - Hold lasix for now. . - AM labs.  HEMATOLOGIC  Recent Labs Lab 04/09/12 0500 04/10/12 0650 04/11/12 0330 04/12/12 0435 04/13/12 0410  HGB 8.2* 8.7* 8.1* 8.6* 7.6*  HCT 26.3* 28.1* 26.8* 27.5* 24.8*  PLT 113* 120* 120* 118* 125*  INR  --  1.06  --   --   --    A:  Thrombocytopenia, unspecified, HIT panel negative  Coagulopathy - likely due to hepatic dysfunction Anemia of critical illness P: - Packed red cells for hemoglobin less than 7 g percent.  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 Troponin  negative. Oral cavity shows thrush versus yogurt  P: - Reexamine oral cavity 04/14/2012: Is thrush present consider IV Diflucan - Continue tube feeds. - Appreciate surgery involvement.  ENDOCRINE  Recent Labs Lab 04/12/12 1129 04/12/12 1556 04/12/12 2006 04/12/12 2347 04/13/12 0429  GLUCAP 81 86 97 108* 105*   A:  Nil acute P:  Monitor  DERMATOLOGY A: 04/13/2012: Has 5 cm stage II skin tear at next to the ostomy P: - Monitor Wound care consult  SYMPTOMS: Pain 04/09/2012: He has significant pain that appears under treated. Wife not happy with current level of pain control. According to nursing he is looking at the clock every 2 hours for pain medication. The pain seems to be back and neuromuscular otherwise does not want to give him any muscular relaxants. He is happy with fentanyl. I will ensure that he gets fentanyl scheduled at night and as needed during the daytime and the wife is present at bedside in the daytime. As of the fentanyl patch we'll place a lidocaine patch in the shoulder where he has pain.  3/18 Persistent pain issues, will add q6 low dose roxanol to hopefully decrease the amount of fentanyl IV uses.  3/19 Oxycodone resulted in N/V, will attempt methadone in its place at very low dose.  3/20 patient had some n/v with methadone but clearly pain has been better, I suspect that N/V were due to high volume of TF.  Patient passed swallow evaluation but his PO intake is simply too minor to sustain caloric intake.  Spoke with the family and patient, will continue with the methadone for now and decrease TF to 40 ml/hr.  Goal is to d/c TF all together once is able to full take diet PO.  Also complains of reflux.  Will double protonix but doubt will improve as long as NGT is in place.  PRN maalox.  04/13/2012 - appears to be tolerating and improving with methadone. Allow only for slow titration of methadone upwards every 5 days  GLOBAL: 04/09/2012: Inpatient  rehabilitation consultation requested but likely will not be til next week. 04/13/2012: Wife updated     Dr. Kalman Shan, M.D., [facility].C.P Pulmonary and Critical Care Medicine Staff Physician [facility] [place] [facility] Pager: [phone], If no answer or between  15:00h - 7:00h: call [phone]  04/13/2012 6:21 PM   .

## 2012-04-14 LAB — GLUCOSE, CAPILLARY: Glucose-Capillary: 85 mg/dL (ref 70–99)

## 2012-04-14 LAB — URINE CULTURE

## 2012-04-14 LAB — TROPONIN I
Troponin I: <0.3 ng/mL (ref <0.30)
Troponin I: <0.3 ng/mL (ref <0.30)

## 2012-04-14 MED ORDER — PANTOPRAZOLE SODIUM 40 MG PO TBEC
40.0000 mg | DELAYED_RELEASE_TABLET | Freq: Two times a day (BID) | ORAL | Status: DC
  Administered 2012-04-14 – 2012-04-17 (×6): 40 mg via ORAL
  Filled 2012-04-14 (×6): qty 1

## 2012-04-14 NOTE — Progress Notes (Signed)
PULMONARY  / CRITICAL CARE MEDICINE  Name: Connor Small MRN: [id number] DOB: [birth year]    ADMISSION DATE:  02/26/2012 CONSULTATION DATE:  2/5  REFERRING MD :  Karilyn Cota  CHIEF COMPLAINT:  Acute resp failure   BRIEF PATIENT DESCRIPTION:  76 YOM admitted to [facility] for Cdiff on 2/2. Underwent decompressive cecostomy 2/3, initially was better, then worsened on 2/4 with increased abdominal distension and tenderness, decreased renal function.  He was taken back to the OR on 2/4 for total abdominal colectomy and end ileostomy.  Post op he developed ARDS and septic shock and was being treated for this.  The morning of 2/6 he had new onset of A-fib and transferred to [facility].   Course complicated by severe hypoalbuminemia, elevated LFTs  -cholestatic without biliary obstruction & pre-renal failure. Hypotensive 3/2 am requiring albumin  LINES / TUBES: ETT ([facility]) 2/4>>> 2/20 PICC ([facility]) 2/4>>>2/11 Art Line ([facility]) 2/5>>>out L IJ CVL 2/06 >> out A line R fem 2/18>>>2/24 Rt ij HD 2/17 >> out Left IJ 2/18 >>  ETT 3/3>>>out post op x 2  CULTURES: BCx2 2/4>>>negative BCx2 2/3>>>negative UC 2/3- Negative MRSA PCR 2/3- Negative C diff 2/6>>>Positive Body fluid 2/9>>>WBCs, no organisms 2/15 BC x 2 >> NEG 2/15 Sputum >>>NF 3/2 bc>>>NGTD 3/2 urine>>>neg 3/2 sputum>>>neg 3/5 cdiff>>>neg  PATH: 3/3 liver bx>>>BENIGN LIVER WITH CHOLESTASIS, The biopsy findings are that of extrahepatic biliary obstruction. No noted primary sclerosing cholangitis.  ANTIBIOTICS: Flagyl 2/3>> d/c'd Vanc 2/6 (IV) >>>2/7 azactam 2/6>>>2/7 Oral vanc 2/6>>>off IV vanc 2/16 >2/24 Aztreonam 2/16 >2/17 Imipenem 2/17>>>2/24 mycofungin 2/18>>>2/24  cipro 3/3>>>3/6 azactam 3/2>>>3/3 vanc 3/2>>>3/5 Flagyl 3/3>>>3/5   SIGNIFICANT EVENTS / STUDIES:  2/2 Admit to [facility] with C diff, toxic megacolon 2/3 Decompressive Cecostomy 2/4 Total abdominal colectomy and end ileostomy 2/6 New A-fib and transfer to [facility]  health. 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 2/10 CT abd/pelvis: small hematoma likely subcapsular spleen, JPs wnl 2/11 trial TFs  2/11 UE venous dopplers: Findings consistent with superficial vein thrombosis involving the right basilic vein 2/14 LE venous dopplers: No evidence of deep vein thrombosis 2/14 LUE venous doppler: No evidence of deep vein or superficial thrombosis involving the left upper extremity  2/15 neg 4 l bal on lasix drip, lasix d/c  2/16 poor neuro status, CT head: [facility] 2/17 CRRT initiated, high pressor needs 2/17 RUQ [place]: Gallbladder wall thickening in the absence of sludge or stones but no biliary ductal dilatation 2/18 reduced vasopressor reqts 2/19 New onset thrombocytopenia. Heparin D/C'd. Argatroban initiated 2/19 [place] abd (repeat): No explanation for elevated liver function tests 2/20 reduction pressors, improved alertness, extubated 2/20 LE venous dopplers: No evidence of deep vein or superficial thrombosis 2/21 bleeding overnight from JP's, argatroban turned off, lower pressor needs 2/22 off pressors, on RA, converted to NSR 2/22 Carotid dopplers: No significant extracranial carotid artery stenosis demonstrated 2/25 Cystoscopy with bilateral retrograde pyelograms. No extravasation of contrast from either ureteral orifice. No evidence of hydroureteronephrosis. Unremarkable urinary bladder. B ureteral stents placed 2/27 MRI brain: Scattered small acute to subacute infarcts in both cerebral hemispheres, brainstem, and right cerebellum. Favor sequelae of emboli from a cardiac or proximal aortic source. Main alternate consideration is hypotensive episode 2/27 MRCP: No intrahepatic bile duct dilatation or common bile duct dilatation. No gallstones identified. Large subcapsular fluid collection encases the spleen. This is of  indeterminate etiology.  This may represent a subacute to chronic subcapsular hematoma or abscess. 2/28 SLP eval: FEES performed. Oropharyngeal weakness noted. Recommend replacement of NGT to supplement PO intake 3/1 Renal signed off 3/3- bowel evisceration, to OR 3/3- no perf / abscess, wound vac open could not close, Liver BX, some oozing, post op resp failure 3/3 echo repeat - improved ef55%, poor windows 3/4- low dose pressor needs 3/4- extubated, high output again from JP's 3/6- OR trip, partial closure wound 3/7- renal fxn continues to improve 3/8- doppler legs >>>Prelim NEG 3/9- hypotension, volume given 3/9- closed, retention service 3/10- GI bleed, required Transfusion, hemodynamics improved, ppi drip 3/12- bili down 04/08/12 - Looks stable sitting up in chair.  No increased wob.  Still requiring tube feeds due to swallowing issues.  Surgery following wound. 04/12/2012:Nausea with oxycodone. Started on by mouth methadone  SUBJECTIVE/OVERNIGHT/INTERVAL HX 04/14/2012: Wife in room. They are satisfied with pain management and offer no new requests or concerns at this time.  VITAL SIGNS: Temp:  [97.7 F (36.5 C)-98.7 F (37.1 C)] 98.7 F (37.1 C) (03/22 0800) Pulse Rate:  [89-109] 96 (03/22 0800) Resp:  [14-30] 30 (03/22 0800) BP: (95-119)/(52-72) 95/52 mmHg (03/22 0800) SpO2:  [98 %-100 %] 99 % (03/22 0800)  INTAKE / OUTPUT: Intake/Output     03/21 0701 - 03/22 0700 03/22 0701 - 03/23 0700   P.O.     I.V. (mL/kg) 440 (5.2) 40 (0.5)   NG/GT 400 0   IV Piggyback     Total Intake(mL/kg) 840 (10) 40 (0.5)   Urine (mL/kg/hr) 700 (0.3)    Stool 200 (0.1)    Total Output 900     Net -60 +40        Stool Occurrence  200 x     PHYSICAL EXAMINATION: General very deconditioned male. Icteric, no increased wob Neuro: follows commands well, moves all 4. Responds verbally appropriately HEENT: Sclericterus, no TMG or LN.  Cardiovascular: regular mild tachy Lungs: CTA, good air  entry, mild basilar crackles. Abdomen: dressing in place, bs +. Panda tube Musculoskeletal: no edema or cyanosis. Atrophic Skin:  No rash but has 5 cm stage II skin also to the right of the ostomy in the right lower quadrant  LABS:  CBC    Component Value Date/Time   WBC 5.5 04/13/2012 0410   RBC 2.40* 04/13/2012 0410   HGB 7.6* 04/13/2012 0410   HCT 24.8* 04/13/2012 0410   PLT 125* 04/13/2012 0410   MCV 103.3* 04/13/2012 0410   MCH 31.7 04/13/2012 0410   MCHC 30.6 04/13/2012 0410   RDW 20.7* 04/13/2012 0410   LYMPHSABS 0.3* 04/02/2012 1158   MONOABS 0.3 04/02/2012 1158   EOSABS 0.1 04/02/2012 1158   BASOSABS 0.0 04/02/2012 1158    Recent Labs Lab 04/13/12 1538 04/13/12 2057 04/14/12 0005 04/14/12 0336 04/14/12 0812  GLUCAP 96 79 96 100* 98    Dg Abd Portable 1v  04/12/2012  *RADIOLOGY REPORT*  Clinical Data: Panda tube placement.  PORTABLE ABDOMEN - 1 VIEW  Comparison: 04/11/2012.  Findings: The feeding tube tip is in the body region of the stomach.  The bowel gas pattern is unremarkable.  IMPRESSION: Feeding tube tip is in the body region of the stomach.   Original Report Authenticated By: Rudie Meyer, M.D.    Imaging x 48 hours Dg Abd Portable 1v  04/12/2012  *RADIOLOGY REPORT*  Clinical Data: Panda tube placement.  PORTABLE ABDOMEN - 1 VIEW  Comparison: 04/11/2012.  Findings: The feeding tube  tip is in the body region of the stomach.  The bowel gas pattern is unremarkable.  IMPRESSION: Feeding tube tip is in the body region of the stomach.   Original Report Authenticated By: Rudie Meyer, M.D.     ASSESSMENT AND PLAN  RESPIRATORY No results found for this basename: PHART, PCO2, PCO2ART, PO2ART, HCO3, O2SAT,  in the last 168 hours A:  Pleural effusion, L > R Remaining effusion but no distress P: - Continue aggressive pulmonary toilet. No change in strategy to offer - Push ambulation. - Physical therapy.  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 - 04/14/2012: Normal mental status but has significant chronic multi-site pain and diffuse muscle atrophy/ weakness P: Continue methadone 5 mg twice a day started 04/13/2012 Titrate methadone only every 5 days because of prolonged half-life Check QTc interval periodically while on methadone Use fentanyl when necessary  INFECTIOUS DISEASE  Recent Labs Lab 04/09/12 0500 04/10/12 0650 04/11/12 0330 04/12/12 0435 04/13/12 0410  WBC 3.5* 4.1 4.1 5.0 5.5  No results found for this basename: LATICACIDVEN, PROCALCITON,  in the last 168 hours  A:  Mild leukopenia P: Monitor  RENAL and electrolytes I/O last 3 completed shifts: In: 1910 [I.V.:670; NG/GT:1240] Out: 1905 [Urine:1055; Stool:850]  Recent Labs Lab 04/09/12 0500 04/10/12 0650 04/11/12 0330 04/12/12 0435 04/13/12 0410  NA 152* 154* 152* 155* 154*  K 3.7 4.2 3.6 4.3 3.3*  CL 123* 125* 122* 122* 121*  CO2 21 21 23 23 23   BUN 61* 60* 58* 58* 56*  CREATININE 1.30 1.27 1.27 1.37* 1.28  CALCIUM 8.3* 8.5 8.3* 8.5 8.4  MG  --  1.9 1.9 1.8 2.3  PHOS  --  2.7 3.1 3.0 3.3   A:ARF (acute renal failure), Cr leveling off -?now CKD   Hypovolemia 3/9, ATN 3/10 from GI bleed - 04/13/2012: Creatinine is improving P: - Increased and continue free water for hypernatremia to 300 q6 hours. - Hold lasix for now. . - AM labs.  HEMATOLOGIC  Recent Labs Lab 04/09/12 0500 04/10/12 0650 04/11/12 0330 04/12/12 0435 04/13/12 0410  HGB 8.2* 8.7* 8.1* 8.6* 7.6*  HCT 26.3* 28.1* 26.8* 27.5* 24.8*  PLT 113* 120* 120* 118* 125*  INR  --  1.06  --   --   --    A:  Thrombocytopenia, unspecified, HIT  panel negative  Coagulopathy - likely due to hepatic dysfunction Anemia of critical illness P: - Packed red cells for hemoglobin less than 7 g percent. 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 - Appreciate surgery involvement.  ENDOCRINE  Recent Labs Lab 04/13/12 1538 04/13/12 2057 04/14/12 0005 04/14/12 0336 04/14/12 0812  GLUCAP 96 79 96 100* 98   A:  Nil acute P:  Monitor  DERMATOLOGY A: 04/14/2012: Has 5 cm stage II skin tear at next to the ostomy P: - Monitor Wound care consult  SYMPTOMS: Pain 04/09/2012: He has significant pain that appears under treated. Wife not happy with current level of pain control. According to nursing he is looking at the clock every 2 hours for pain medication. The pain seems to be back and neuromuscular otherwise does not want to give him any muscular relaxants. He is happy with fentanyl. I will ensure that he gets fentanyl scheduled at night and as needed during the daytime and the wife is present at bedside in the daytime. As of the fentanyl patch we'll place a lidocaine patch in the shoulder where he has pain.  3/18 Persistent pain issues, will add q6 low dose roxanol to hopefully decrease the amount of fentanyl IV uses.  3/19 Oxycodone resulted in N/V, will attempt methadone in its place at very low dose.  3/20 patient had some n/v with methadone but clearly pain has been better, I suspect that N/V were due to high volume of TF.  Patient passed swallow evaluation but  his PO intake is simply too minor to sustain caloric intake.  Spoke with the family and patient, will continue with the methadone for now and decrease TF to 40 ml/hr.  Goal is to d/c TF all together once is able to full take diet PO.  Also complains of reflux.  Will double protonix but doubt will improve as long as NGT is in place.  PRN maalox.  04/13/2012 - appears to be tolerating and improving with methadone. Allow only for slow titration of methadone upwards every 5 day  04/14/12- Pain control better  GLOBAL: 04/09/2012: Inpatient rehabilitation consultation requested but likely will not be til next week. 04/14/2012: Wife updated     CD Maple Hudson, MD Pulmonary and Critical Care Medicine Staff Physician [facility] Pager: [phone], If no answer or after 3:00 PM call [phone]  04/14/2012 10:32 AM   .

## 2012-04-14 NOTE — Progress Notes (Signed)
No c/o pain today. Tolerated OOB 6 hours.Pt eating 20% meals. Heartburn and nausea controlled. Chief complaint of nausea is directly after being hoyered to chair or bed.Support given to wife.

## 2012-04-15 LAB — CBC WITH DIFFERENTIAL/PLATELET
Basophils Absolute: 0 10*3/uL (ref 0.0–0.1)
HCT: 27 % — ABNORMAL LOW (ref 39.0–52.0)
Hemoglobin: 8.4 g/dL — ABNORMAL LOW (ref 13.0–17.0)
Lymphocytes Relative: 15 % (ref 12–46)
Monocytes Absolute: 0.5 10*3/uL (ref 0.1–1.0)
Monocytes Relative: 8 % (ref 3–12)
Neutro Abs: 4.5 10*3/uL (ref 1.7–7.7)
Neutrophils Relative %: 76 % (ref 43–77)
WBC: 5.9 10*3/uL (ref 4.0–10.5)

## 2012-04-15 LAB — GLUCOSE, CAPILLARY
Glucose-Capillary: 106 mg/dL — ABNORMAL HIGH (ref 70–99)
Glucose-Capillary: 108 mg/dL — ABNORMAL HIGH (ref 70–99)
Glucose-Capillary: 83 mg/dL (ref 70–99)

## 2012-04-15 NOTE — Progress Notes (Signed)
PULMONARY  / CRITICAL CARE MEDICINE  Name: Connor Small MRN: [id number] DOB: [birth year]    ADMISSION DATE:  02/26/2012 CONSULTATION DATE:  2/5  REFERRING MD :  Karilyn Cota  CHIEF COMPLAINT:  Acute resp failure   BRIEF PATIENT DESCRIPTION:  [name] admitted to [facility] for Cdiff on 2/2. Underwent decompressive cecostomy 2/3, initially was better, then worsened on 2/4 with increased abdominal distension and tenderness, decreased renal function.  He was taken back to the OR on 2/4 for total abdominal colectomy and end ileostomy.  Post op he developed ARDS and septic shock and was being treated for this.  The morning of 2/6 he had new onset of A-fib and transferred to [facility].   Course complicated by severe hypoalbuminemia, elevated LFTs  -cholestatic without biliary obstruction & pre-renal failure. Hypotensive 3/2 am requiring albumin  LINES / TUBES: ETT ([facility]) 2/4>>> 2/20 PICC ([facility]) 2/4>>>2/11 Art Line ([facility]) 2/5>>>out L IJ CVL 2/06 >> out A line R fem 2/18>>>2/24 Rt ij HD 2/17 >> out Left IJ 2/18 >>  ETT 3/3>>>out post op x 2  CULTURES: BCx2 2/4>>>negative BCx2 2/3>>>negative UC 2/3- Negative MRSA PCR 2/3- Negative C diff 2/6>>>Positive Body fluid 2/9>>>WBCs, no organisms 2/15 BC x 2 >> NEG 2/15 Sputum >>>NF 3/2 bc>>>NGTD 3/2 urine>>>neg 3/2 sputum>>>neg 3/5 cdiff>>>neg  PATH: 3/3 liver bx>>>BENIGN LIVER WITH CHOLESTASIS, The biopsy findings are that of extrahepatic biliary obstruction. No noted primary sclerosing cholangitis.  ANTIBIOTICS: Flagyl 2/3>> d/c'd Vanc 2/6 (IV) >>>2/7 azactam 2/6>>>2/7 Oral vanc 2/6>>>off IV vanc 2/16 >2/24 Aztreonam 2/16 >2/17 Imipenem 2/17>>>2/24 mycofungin 2/18>>>2/24  cipro 3/3>>>3/6 azactam 3/2>>>3/3 vanc 3/2>>>3/5 Flagyl 3/3>>>3/5   SIGNIFICANT EVENTS / STUDIES:  2/2 Admit to [facility] with C diff, toxic megacolon 2/3 Decompressive Cecostomy 2/4 Total abdominal colectomy and end ileostomy 2/6 New A-fib and transfer to [facility]  health. 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 2/10 CT abd/pelvis: small hematoma likely subcapsular spleen, JPs wnl 2/11 trial TFs  2/11 UE venous dopplers: Findings consistent with superficial vein thrombosis involving the right basilic vein 2/14 LE venous dopplers: No evidence of deep vein thrombosis 2/14 LUE venous doppler: No evidence of deep vein or superficial thrombosis involving the left upper extremity  2/15 neg 4 l bal on lasix drip, lasix d/c  2/16 poor neuro status, CT head: [facility] 2/17 CRRT initiated, high pressor needs 2/17 RUQ [place]: Gallbladder wall thickening in the absence of sludge or stones but no biliary ductal dilatation 2/18 reduced vasopressor reqts 2/19 New onset thrombocytopenia. Heparin D/C'd. Argatroban initiated 2/19 [place] abd (repeat): No explanation for elevated liver function tests 2/20 reduction pressors, improved alertness, extubated 2/20 LE venous dopplers: No evidence of deep vein or superficial thrombosis 2/21 bleeding overnight from JP's, argatroban turned off, lower pressor needs 2/22 off pressors, on RA, converted to NSR 2/22 Carotid dopplers: No significant extracranial carotid artery stenosis demonstrated 2/25 Cystoscopy with bilateral retrograde pyelograms. No extravasation of contrast from either ureteral orifice. No evidence of hydroureteronephrosis. Unremarkable urinary bladder. B ureteral stents placed 2/27 MRI brain: Scattered small acute to subacute infarcts in both cerebral hemispheres, brainstem, and right cerebellum. Favor sequelae of emboli from a cardiac or proximal aortic source. Main alternate consideration is hypotensive episode 2/27 MRCP: No intrahepatic bile duct dilatation or common bile duct dilatation. No gallstones identified. Large subcapsular fluid collection encases the spleen. This is of  indeterminate etiology.  This may represent a subacute to chronic subcapsular hematoma or abscess. 2/28 SLP eval: FEES performed. Oropharyngeal weakness noted. Recommend replacement of NGT to supplement PO intake 3/1 Renal signed off 3/3- bowel evisceration, to OR 3/3- no perf / abscess, wound vac open could not close, Liver BX, some oozing, post op resp failure 3/3 echo repeat - improved ef55%, poor windows 3/4- low dose pressor needs 3/4- extubated, high output again from JP's 3/6- OR trip, partial closure wound 3/7- renal fxn continues to improve 3/8- doppler legs >>>Prelim NEG 3/9- hypotension, volume given 3/9- closed, retention service 3/10- GI bleed, required Transfusion, hemodynamics improved, ppi drip 3/12- bili down 04/08/12 - Looks stable sitting up in chair.  No increased wob.  Still requiring tube feeds due to swallowing issues.  Surgery following wound. 04/12/2012:Nausea with oxycodone. Started on by mouth methadone  SUBJECTIVE/OVERNIGHT/INTERVAL HX 04/14/2012: Wife in room.  Nausea mainly when he feels heartburn as panda is flushed. Dislikes taste of ? Plastic. I suggested sugarless lollipops. Discussed pain management andexplained why lidoderm patch taken off every 12 hours.  VITAL SIGNS: Temp:  [98.2 F (36.8 C)-98.5 F (36.9 C)] 98.5 F (36.9 C) (03/23 0406) Pulse Rate:  [101-108] 105 (03/23 0406) Resp:  [14-19] 19 (03/23 0406) BP: (100-113)/(57-74) 109/64 mmHg (03/23 0406) SpO2:  [99 %-100 %] 100 % (03/23 0406) Weight:  [76.4 kg (168 lb 6.9 oz)] 76.4 kg (168 lb 6.9 oz) (03/23 0406)  INTAKE / OUTPUT: Intake/Output     03/22 0701 - 03/23 0700 03/23 0701 - 03/24 0700   P.O. 35    I.V. (mL/kg) 440 (5.8) 60 (0.8)   NG/GT 1200 0   Total Intake(mL/kg) 1675 (21.9) 60 (0.8)   Urine (mL/kg/hr) 400 (0.2)    Stool 800 (0.4) 100 (0.5)   Total Output 1200 100   Net +475 -40        Stool Occurrence 400 x      PHYSICAL EXAMINATION: General very deconditioned male.  Icteric, no increased wob Neuro: follows commands well, moves all 4. Responds verbally appropriately HEENT:  no TMG or LN. Minimal coating on tongue- not significant thrush Cardiovascular: regular mild tachy Lungs: CTA, good air entry, mild basilar crackles. Shallow. Abdomen: dressing in place, bs +. Panda tube Musculoskeletal: no edema or cyanosis. Atrophic. Splint R wrist, pillows bracing feet Skin:  No rash but has 5 cm stage II skin also to the right of the ostomy in the right lower quadrant  LABS:  CBC    Component Value Date/Time   WBC 5.9 04/15/2012 0839   RBC 2.66* 04/15/2012 0839   HGB 8.4* 04/15/2012 0839   HCT 27.0* 04/15/2012 0839   PLT 185 04/15/2012 0839   MCV 101.5* 04/15/2012 0839   MCH 31.6 04/15/2012 0839   MCHC 31.1 04/15/2012 0839   RDW 19.6* 04/15/2012 0839   LYMPHSABS 0.9 04/15/2012 0839   MONOABS 0.5 04/15/2012 0839   EOSABS 0.1 04/15/2012 0839   BASOSABS 0.0 04/15/2012 0839    Recent Labs Lab 04/14/12 1613 04/14/12 2041 04/15/12 0013 04/15/12 0340 04/15/12 0754  GLUCAP 85 78 83 106* 108*    No results found. Imaging x 48 hours No results found.  ASSESSMENT AND PLAN  RESPIRATORY No results found for this basename: PHART, PCO2, PCO2ART, PO2ART, HCO3, O2SAT,  in the last 168 hours A:  Pleural effusion, L > R Remaining effusion but no distress P: - Continue aggressive pulmonary toilet. No change in strategy to offer - Push ambulation. - Physical therapy.  CARDIAC  Recent Labs Lab 04/10/12 0650  04/13/12 1931 04/14/12 0157 04/14/12 0530 04/14/12 1209 04/14/12 1827  TROPONINI  --   < > <0.30 <0.30 <0.30 <0.30 <0.30  PROBNP 239.6*  --   --   --   --   --   --   < > = values in this interval not displayed.    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 -  04/14/2012: Normal mental status but has significant chronic multi-site pain and diffuse muscle atrophy/ weakness P: Continue methadone 5 mg twice a day started 04/13/2012 Titrate methadone only every 5 days because of prolonged half-life Check QTc interval periodically while on methadone Use fentanyl when necessary -watching nausea w/ methadone  INFECTIOUS DISEASE  Recent Labs Lab 04/10/12 0650 04/11/12 0330 04/12/12 0435 04/13/12 0410 04/15/12 0839  WBC 4.1 4.1 5.0 5.5 5.9  No results found for this basename: LATICACIDVEN, PROCALCITON,  in the last 168 hours  A:  Mild leukopenia P: Monitor  RENAL and electrolytes I/O last 3 completed shifts: In: 2295 [P.O.:35; I.V.:660; NG/GT:1600] Out: 1300 [Urine:500; Stool:800]  Recent Labs Lab 04/09/12 0500 04/10/12 0650 04/11/12 0330 04/12/12 0435 04/13/12 0410  NA 152* 154* 152* 155* 154*  K 3.7 4.2 3.6 4.3 3.3*  CL 123* 125* 122* 122* 121*  CO2 21 21 23 23 23   BUN 61* 60* 58* 58* 56*  CREATININE 1.30 1.27 1.27 1.37* 1.28  CALCIUM 8.3* 8.5 8.3* 8.5 8.4  MG  --  1.9 1.9 1.8 2.3  PHOS  --  2.7 3.1 3.0 3.3   A:ARF (acute renal failure), Cr leveling off -?now CKD   Hypovolemia 3/9, ATN 3/10 from GI bleed - 04/13/2012: Creatinine is improving P: - Increased and continue free water for hypernatremia to 300 q6 hours. - Hold lasix for now. - update BMET . - AM labs.  HEMATOLOGIC  Recent Labs Lab 04/10/12 0650 04/11/12 0330 04/12/12 0435 04/13/12 0410 04/15/12 0839  HGB 8.7* 8.1* 8.6* 7.6* 8.4*  HCT 28.1* 26.8* 27.5* 24.8* 27.0*  PLT 120* 120* 118* 125* 185  INR 1.06  --   --   --   --    A:  Thrombocytopenia, unspecified, HIT panel negative  Coagulopathy - likely due to hepatic dysfunction Anemia of critical illness P: - Packed red cells for hemoglobin less than 7 g percent.   -CBC 3/23  GASTROINTESTINAL  Recent Labs Lab 04/10/12 0650  AST 48*  ALT 90*  ALKPHOS 695*  BILITOT 3.3*  PROT 4.9*   ALBUMIN 1.7*  INR 1.06   A:  S/p total colectomy with end ileostomy 2/5 for toxic megacolon, C diff  Prolonged complicated critical illness Wound evisceration resolved, Wound closed 3/9   Hyperbilirubinemia, due to cholestasis - improved,  - Tolerating Panda, soft feeds orally P: - Continue tube feeds. - Appreciate surgery involvement.  ENDOCRINE  Recent Labs Lab 04/14/12 1613 04/14/12 2041 04/15/12 0013 04/15/12 0340 04/15/12 0754  GLUCAP 85 78 83 106* 108*   A:  Nil acute P:  Monitor  DERMATOLOGY A: 04/14/2012: Has 5 cm stage II skin tear at next to the ostomy P: - Monitor Wound care consult  SYMPTOMS: Pain 04/09/2012: He has significant pain that appears under treated. Wife not happy with current level of pain control. According to nursing he is  looking at the clock every 2 hours for pain medication. The pain seems to be back and neuromuscular otherwise does not want to give him any muscular relaxants. He is happy with fentanyl. I will ensure that he gets fentanyl scheduled at night and as needed during the daytime and the wife is present at bedside in the daytime. As of the fentanyl patch we'll place a lidocaine patch in the shoulder where he has pain.  3/18 Persistent pain issues, will add q6 low dose roxanol to hopefully decrease the amount of fentanyl IV uses.  3/19 Oxycodone resulted in N/V, will attempt methadone in its place at very low dose.  3/20 patient had some n/v with methadone but clearly pain has been better, I suspect that N/V were due to high volume of TF.  Patient passed swallow evaluation but his PO intake is simply too minor to sustain caloric intake.  Spoke with the family and patient, will continue with the methadone for now and decrease TF to 40 ml/hr.  Goal is to d/c TF all together once is able to full take diet PO.  Also complains of reflux.  Will double protonix but doubt will improve as long as NGT is in place.  PRN maalox.  04/13/2012 -  appears to be tolerating and improving with methadone. Allow only for slow titration of methadone upwards every 5 day  03/23- Several comfort measures addressed. Tolerance of pain meds is borderline.  GLOBAL: 04/09/2012: Inpatient rehabilitation consultation requested but likely will not be til next week. 04/15/2012: Wife updated   CD Maple Hudson, MD Pulmonary and Critical Care Medicine Staff Physician [facility] Pager: [phone], If no answer or after 3:00 PM call [phone]  04/15/2012 9:40 AM   .

## 2012-04-16 ENCOUNTER — Inpatient Hospital Stay (HOSPITAL_COMMUNITY): Payer: BC Managed Care – PPO

## 2012-04-16 LAB — CBC WITH DIFFERENTIAL/PLATELET
Basophils Absolute: 0 10*3/uL (ref 0.0–0.1)
Basophils Relative: 1 % (ref 0–1)
MCHC: 30.8 g/dL (ref 30.0–36.0)
Neutro Abs: 7.5 10*3/uL (ref 1.7–7.7)
Neutrophils Relative %: 86 % — ABNORMAL HIGH (ref 43–77)
RDW: 19.4 % — ABNORMAL HIGH (ref 11.5–15.5)
WBC: 8.8 10*3/uL (ref 4.0–10.5)

## 2012-04-16 LAB — COMPREHENSIVE METABOLIC PANEL
ALT: 47 U/L (ref 0–53)
AST: 31 U/L (ref 0–37)
Albumin: 1.5 g/dL — ABNORMAL LOW (ref 3.5–5.2)
Alkaline Phosphatase: 378 U/L — ABNORMAL HIGH (ref 39–117)
Chloride: 124 mEq/L — ABNORMAL HIGH (ref 96–112)
Potassium: 3.5 mEq/L (ref 3.5–5.1)
Sodium: 155 mEq/L — ABNORMAL HIGH (ref 135–145)
Total Bilirubin: 1.8 mg/dL — ABNORMAL HIGH (ref 0.3–1.2)

## 2012-04-16 MED ORDER — FENTANYL CITRATE 0.05 MG/ML IJ SOLN: 25.0000 ug | INTRAMUSCULAR | Status: DC | PRN

## 2012-04-16 MED ORDER — FREE WATER: 300.0000 mL | Status: DC

## 2012-04-16 MED ORDER — METHADONE HCL 5 MG PO TABS
5.0000 mg | ORAL_TABLET | Freq: Three times a day (TID) | ORAL | Status: DC
  Administered 2012-04-16 – 2012-04-17 (×4): 5 mg
  Filled 2012-04-16 (×4): qty 1

## 2012-04-16 NOTE — Progress Notes (Addendum)
Physical Therapy Treatment Patient Details Name: Connor Small MRN: [id number] DOB: [birth year] Today's Date: 04/16/2012 Time: 4098-1191 PT Time Calculation (min): 45 min  PT Assessment / Plan / Recommendation Comments on Treatment Session  Patient tolerating sitting edge of bed and unsupported for longer periods and able to recover from balance challenges or perturbations within short range.  Struggled today to perform stand pivot transfer and unable to get appropriate set up prior to pt too fatigued to assist to allow for OOB on his feet, but iin standing trials was more erect than he had been before and able to tolerate multiple trials.  Still needs rest due to HR up to 138 with activity, but participates well and with improvements noted at each session.    Follow Up Recommendations  CIR     Does the patient have the potential to tolerate intense rehabilitation   yes  Barriers to Discharge  None      Equipment Recommendations  None recommended by PT    Recommendations for Other Services  None  Frequency Min 3X/week   Plan Discharge plan remains appropriate    Precautions / Restrictions Precautions Precautions: Fall Precaution Comments: Abd binder, ostomy, panda Required Braces or Orthoses: Other Brace/Splint Other Brace/Splint: R wrist cock up slint Restrictions Weight Bearing Restrictions: No Other Position/Activity Restrictions: support R UE   Pertinent Vitals/Pain C/o right UE pain initially, but improved after AAROM with OT, HR max 138 with standing trials.    Mobility  Bed Mobility Bed Mobility: Rolling Right;Rolling Left;Left Sidelying to Sit;Sitting - Scoot to [place] of Bed;Sit to Supine;Scooting to [facility] Rolling Right: 1: +2 Total assist;With rail Rolling Right: Patient Percentage: 20% Rolling Left: 1: +2 Total assist Rolling Left: Patient Percentage: 10% Left Sidelying to Sit: 1: +2 Total assist Left Sidelying to Sit: Patient Percentage: 10% Sitting - Scoot to Edge  of Bed: 1: +1 Total assist Sitting - Scoot to Edge of Bed: Patient Percentage: 0% Sit to Supine: 1: +2 Total assist Sit to Supine: Patient Percentage: 10% Scooting to HOB: 1: +2 Total assist Scooting to [facility]: Patient Percentage: 0% Details for Bed Mobility Assistance: quicker to participate and recall what he can do to assist wtih bed mobility Transfers Transfers: Lateral/Scoot Transfers Sit to Stand: 2: Max assist Sit to Stand: Patient Percentage: 10% Stand to Sit: 1: +2 Total assist;To bed Stand to Sit: Patient Percentage: 10% Details for Transfer Assistance: able to get more fully upright in standing.  Still difficulty managing right UE during transfers due to poor proprioception and weak stabilizers; attempted stand pivot to chair, but due to no pads under patient, his level of fatigue and right UE management unable to perform safely so assisted pt to lie back down in bed.; did scoot in bed towards head of bed to get pt off hump in bed for better sitting balance and pt. able to assist wtih balance and pushing through feet         PT Goals Acute Rehab PT Goals Pt will go Supine/Side to Sit: with +1 total assist PT Goal: Supine/Side to Sit - Progress: Progressing toward goal Pt will Sit at [facility] of Bed: with supervision;with unilateral upper extremity support;6-10 min PT Goal: Sit at Edge Of Bed - Progress: Updated due to goal met Pt will go Sit to Stand: with max assist PT Goal: Sit to Stand - Progress: Updated due to goal met Pt will Transfer Bed to Chair/Chair to Bed: with +1 total assist PT Transfer Goal: Bed  to Chair/Chair to Bed - Progress: Progressing toward goal  Visit Information  Last PT Received On: 04/16/12 Assistance Needed: +2 PT/OT Co-Evaluation/Treatment: Yes    Subjective Data  Subjective: Doing okay.  The bed has not been working right.   Cognition  Cognition Overall Cognitive Status: Impaired Area of Impairment: Attention;Problem  solving;Memory Arousal/Alertness: Awake/alert Memory Deficits: difficulty recalling previous sessions and how he has progressed; does recall being dropped and still fearful Following Commands: Follows one step commands consistently Cognition - Other Comments: Worked with pt in low stimulus setting.    Balance  Balance Balance Assessed: Yes Static Sitting Balance Static Sitting - Balance Support: Bilateral upper extremity supported;Feet supported Static Sitting - Level of Assistance: 5: Stand by assistance;2: Max assist (required more assist with fatigue) Static Sitting - Comment/# of Minutes: 15; varying levels of support given depending on level of fatigue, getting positioned for self support and for getting improved head control Dynamic Sitting Balance Dynamic Sitting - Balance Support: Left upper extremity supported;Feet supported;Right upper extremity supported Dynamic Sitting - Level of Assistance: 4: Min assist (pt moving away from midline all directions 3-6 inches)  End of Session PT - End of Session Equipment Utilized During Treatment: Other (comment) (abdominal binder) Activity Tolerance: Patient limited by fatigue Patient left: in bed;with call bell/phone within reach   GP     [facility] 04/16/2012, 4:17 PM [place], PT [phone] 04/16/2012

## 2012-04-16 NOTE — Progress Notes (Addendum)
Patient still receiving Fentanyl prn pain. Discussed with Dr. Naaman Plummer. Await weaning off of Fentanyl. Wife reports pain is due to heartburn from panda and headache is from panda therefore pt needs fentanyl.[phone]

## 2012-04-16 NOTE — Progress Notes (Signed)
PULMONARY  / CRITICAL CARE MEDICINE  Name: Connor Small MRN: [id number] DOB: Aug 05, 1960    ADMISSION DATE:  02/26/2012 CONSULTATION DATE:  2/5  REFERRING MD :  Karilyn Cota  CHIEF COMPLAINT:  Acute resp failure   BRIEF PATIENT DESCRIPTION:  61 YOM admitted to [facility] for Cdiff on 2/2. Underwent decompressive cecostomy 2/3, initially was better, then worsened on 2/4 with increased abdominal distension and tenderness, decreased renal function.  He was taken back to the OR on 2/4 for total abdominal colectomy and end ileostomy.  Post op he developed ARDS and septic shock and was being treated for this.  The morning of 2/6 he had new onset of A-fib and transferred to [facility].   Course complicated by severe hypoalbuminemia, elevated LFTs  -cholestatic without biliary obstruction & pre-renal failure. Hypotensive 3/2 am requiring albumin  LINES / TUBES: ETT ([facility]) 2/4>>> 2/20 PICC ([facility]) 2/4>>>2/11 Art Line ([facility]) 2/5>>>out L IJ CVL 2/06 >> out A line R fem 2/18>>>2/24 Rt ij HD 2/17 >> out Left IJ 2/18 >>  ETT 3/3>>>out post op x 2  CULTURES: BCx2 2/4>>>negative BCx2 2/3>>>negative UC 2/3- Negative MRSA PCR 2/3- Negative C diff 2/6>>>Positive Body fluid 2/9>>>WBCs, no organisms 2/15 BC x 2 >> NEG 2/15 Sputum >>>NF 3/2 bc>>>NGTD 3/2 urine>>>neg 3/2 sputum>>>neg 3/5 cdiff>>>neg  PATH: 3/3 liver bx>>>BENIGN LIVER WITH CHOLESTASIS, The biopsy findings are that of extrahepatic biliary obstruction. No noted primary sclerosing cholangitis.  ANTIBIOTICS: Flagyl 2/3>> d/c'd Vanc 2/6 (IV) >>>2/7 azactam 2/6>>>2/7 Oral vanc 2/6>>>off IV vanc 2/16 >2/24 Aztreonam 2/16 >2/17 Imipenem 2/17>>>2/24 mycofungin 2/18>>>2/24  cipro 3/3>>>3/6 azactam 3/2>>>3/3 vanc 3/2>>>3/5 Flagyl 3/3>>>3/5   SIGNIFICANT EVENTS / STUDIES:  2/2 Admit to [facility] with C diff, toxic megacolon 2/3 Decompressive Cecostomy 2/4 Total abdominal colectomy and end ileostomy 2/6 New A-fib and transfer to [facility]  health. 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 2/10 CT abd/pelvis: small hematoma likely subcapsular spleen, JPs wnl 2/11 trial TFs  2/11 UE venous dopplers: Findings consistent with superficial vein thrombosis involving the right basilic vein 2/14 LE venous dopplers: No evidence of deep vein thrombosis 2/14 LUE venous doppler: No evidence of deep vein or superficial thrombosis involving the left upper extremity  2/15 neg 4 l bal on lasix drip, lasix d/c  2/16 poor neuro status, CT head: [facility] 2/17 CRRT initiated, high pressor needs 2/17 RUQ [place]: Gallbladder wall thickening in the absence of sludge or stones but no biliary ductal dilatation 2/18 reduced vasopressor reqts 2/19 New onset thrombocytopenia. Heparin D/C'd. Argatroban initiated 2/19 [place] abd (repeat): No explanation for elevated liver function tests 2/20 reduction pressors, improved alertness, extubated 2/20 LE venous dopplers: No evidence of deep vein or superficial thrombosis 2/21 bleeding overnight from JP's, argatroban turned off, lower pressor needs 2/22 off pressors, on RA, converted to NSR 2/22 Carotid dopplers: No significant extracranial carotid artery stenosis demonstrated 2/25 Cystoscopy with bilateral retrograde pyelograms. No extravasation of contrast from either ureteral orifice. No evidence of hydroureteronephrosis. Unremarkable urinary bladder. B ureteral stents placed 2/27 MRI brain: Scattered small acute to subacute infarcts in both cerebral hemispheres, brainstem, and right cerebellum. Favor sequelae of emboli from a cardiac or proximal aortic source. Main alternate consideration is hypotensive episode 2/27 MRCP: No intrahepatic bile duct dilatation or common bile duct dilatation. No gallstones identified. Large subcapsular fluid collection encases the spleen. This is of  indeterminate etiology.  This may represent a subacute to chronic subcapsular hematoma or abscess. 2/28 SLP eval: FEES performed. Oropharyngeal weakness noted. Recommend replacement of NGT to supplement PO intake 3/1 Renal signed off 3/3- bowel evisceration, to OR 3/3- no perf / abscess, wound vac open could not close, Liver BX, some oozing, post op resp failure 3/3 echo repeat - improved ef55%, poor windows 3/4- low dose pressor needs 3/4- extubated, high output again from JP's 3/6- OR trip, partial closure wound 3/7- renal fxn continues to improve 3/8- doppler legs >>>Prelim NEG 3/9- hypotension, volume given 3/9- closed, retention service 3/10- GI bleed, required Transfusion, hemodynamics improved, ppi drip 3/12- bili down 04/08/12 - Looks stable sitting up in chair.  No increased wob.  Still requiring tube feeds due to swallowing issues.  Surgery following wound. 04/12/2012:Nausea with oxycodone. Started on by mouth methadone 04/14/2012: Wife in room.  Nausea mainly when he feels heartburn as panda is flushed. Dislikes taste of ? Plastic. I suggested sugarless lollipops. Discussed pain management andexplained why lidoderm patch taken off every 12 hours.   SUBJECTIVE/OVERNIGHT/INTERVAL HX 3/24 - still with thrush in mouth and dysphagia with some pain. Wife insists it is  Yogurt. Wife feels dysphagia and pain due to panda. Pain needs improving ever since methadone started. Overnight needed some < fentanyl only. Rehab will take him once he is off scheduled IV narcs  VITAL SIGNS: Temp:  [98 F (36.7 C)-98.3 F (36.8 C)] 98 F (36.7 C) (03/24 0731) Pulse Rate:  [99-111] 106 (03/24 0731) Resp:  [14-19] 14 (03/24 0731) BP: (98-110)/(60-74) 103/60 mmHg (03/24 0731) SpO2:  [97 %-99 %] 99 % (03/24 0731) Weight:  [76.6 kg (168 lb 14 oz)] 76.6 kg (168 lb 14 oz) (03/24 0354)  INTAKE / OUTPUT: Intake/Output     03/23 0701 - 03/24 0700 03/24 0701 - 03/25 0700   P.O.     I.V. (mL/kg)  500 (6.5) 80 (1)   NG/GT 1080 40   Total Intake(mL/kg) 1580 (20.6) 120 (1.6)   Urine (mL/kg/hr) 475 (0.3) 150 (0.5)   Stool 825 (0.4) 200 (0.6)   Total Output 1300 350   Net +280 -230          PHYSICAL EXAMINATION: General very deconditioned male. Icteric, no increased wob. STronger than a week ago Neuro: follows commands well, moves all 4. Responds verbally appropriately HEENT:  no TMG or LN. ORAL THRUSH + (wife says this is yogurt) Cardiovascular: regular mild tachy Lungs: CTA, good air entry, mild basilar crackles. Shallow. Abdomen: dressing in place, bs +. Panda tube Musculoskeletal: no edema or cyanosis. Atrophic. Splint R wrist, pillows bracing feet Skin:  No rash but has 5 cm stage II skin also to the right of the ostomy in the right lower quadrant  LABS:  CBC    Component Value Date/Time   WBC 8.8 04/16/2012 0549   RBC 2.42* 04/16/2012 0549   HGB 7.6* 04/16/2012 0549   HCT 24.7* 04/16/2012 0549   PLT 212 04/16/2012 0549   MCV 102.1* 04/16/2012 0549   MCH 31.4 04/16/2012 0549   MCHC 30.8 04/16/2012 0549   RDW 19.4* 04/16/2012 0549   LYMPHSABS 0.8 04/16/2012 0549   MONOABS 0.4 04/16/2012 0549   EOSABS 0.1 04/16/2012 0549   BASOSABS 0.0 04/16/2012 0549    Recent Labs Lab 04/14/12 1613 04/14/12 2041 04/15/12 0013 04/15/12 0340 04/15/12 0754  GLUCAP 85 78 83 106* 108*    Dg Chest Port 1 View  04/16/2012  *RADIOLOGY REPORT*  Clinical  Data: Hypoventilation.  PORTABLE CHEST - 1 VIEW  Comparison: 04/09/2012  Findings: 0523 hours.  Lung volumes are low.  Left base collapse / consolidation with effusion again noted.  NG tube has been removed in the interval and the tip of the feeding tube now overlies the mid stomach.  Left IJ central line tip projects over the proximal SVC region. Telemetry leads overlie the chest.  IMPRESSION: Interval removal of NG tube with placement of feeding tube with tip projecting over the mid stomach.  Stable cardiopulmonary exam.   Original Report  Authenticated By: Kennith Center, M.D.    Imaging x 48 hours Dg Chest Port 1 View  04/16/2012  *RADIOLOGY REPORT*  Clinical Data: Hypoventilation.  PORTABLE CHEST - 1 VIEW  Comparison: 04/09/2012  Findings: 0523 hours.  Lung volumes are low.  Left base collapse / consolidation with effusion again noted.  NG tube has been removed in the interval and the tip of the feeding tube now overlies the mid stomach.  Left IJ central line tip projects over the proximal SVC region. Telemetry leads overlie the chest.  IMPRESSION: Interval removal of NG tube with placement of feeding tube with tip projecting over the mid stomach.  Stable cardiopulmonary exam.   Original Report Authenticated By: Kennith Center, M.D.     ASSESSMENT AND PLAN  RESPIRATORY No results found for this basename: PHART, PCO2, PCO2ART, PO2ART, HCO3, O2SAT,  in the last 168 hours A:  Pleural effusion, L > R Remaining effusion but no distress P: - Continue aggressive pulmonary toilet. No change in strategy to offer - Push ambulation. - Physical therapy.  CARDIAC  Recent Labs Lab 04/10/12 0650  04/13/12 1931 04/14/12 0157 04/14/12 0530 04/14/12 1209 04/14/12 1827  TROPONINI  --   < > <0.30 <0.30 <0.30 <0.30 <0.30  PROBNP 239.6*  --   --   --   --   --   --   < > = values in this interval not displayed.    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 Overnight needed fent < P: Increased methadone to 5mg  tid on 04/16/12 Titrate methadone only every 5 days because of prolonged half-life Check QTc interval periodically while on methadone; recheck with ekg  04/17/12 Use fentanyl when necessary (dc scheduled fentanyl)   INFECTIOUS DISEASE  Recent Labs Lab 04/11/12 0330 04/12/12 0435 04/13/12 0410 04/15/12 0839 04/16/12 0549  WBC 4.1 5.0 5.5 5.9 8.8  No results found for this basename: LATICACIDVEN, PROCALCITON,  in the last 168 hours  A:  3/24: Pragya Lofaso convinced oral thrush + P: DC panda 3/24 and on 3/25 if dysphagia and pain and  Thrush present on exam - start IV diflucan (wife agreeable) Monitor  RENAL and electrolytes I/O last 3 completed shifts: In: 2800 [I.V.:720; NG/GT:2080] Out: 2400 [Urine:775; Stool:1625]  Recent Labs Lab 04/10/12 0650 04/11/12 0330 04/12/12 0435 04/13/12 0410 04/16/12 0549  NA 154* 152* 155* 154* 155*  K 4.2 3.6 4.3 3.3* 3.5  CL 125* 122* 122* 121* 124*  CO2 21 23 23 23 22   BUN 60* 58* 58* 56* 45*  CREATININE 1.27 1.27 1.37* 1.28 1.25  CALCIUM 8.5 8.3* 8.5  8.4 8.5  MG 1.9 1.9 1.8 2.3  --   PHOS 2.7 3.1 3.0 3.3  --    A:ARF (acute renal failure), Cr leveling off -?now CKD   Hypovolemia 3/9, ATN 3/10 from GI bleed - 04/16/2012: Creatinine is improving P: - Increased and continue free water for hypernatremia to 300 q4 hours. - Hold lasix for now. - update BMET . - AM labs.  HEMATOLOGIC  Recent Labs Lab 04/10/12 0650 04/11/12 0330 04/12/12 0435 04/13/12 0410 04/15/12 0839 04/16/12 0549  HGB 8.7* 8.1* 8.6* 7.6* 8.4* 7.6*  HCT 28.1* 26.8* 27.5* 24.8* 27.0* 24.7*  PLT 120* 120* 118* 125* 185 212  INR 1.06  --   --   --   --   --    A:  Thrombocytopenia, unspecified, HIT panel negative  Coagulopathy - likely due to hepatic dysfunction Anemia of critical illness P: - Packed red cells for hemoglobin less than 7 g percent.   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  ENDOCRINE  Recent Labs Lab 04/14/12 1613 04/14/12 2041 04/15/12 0013 04/15/12 0340 04/15/12 0754  GLUCAP 85 78 83 106* 108*   A:  Nil acute P:  Monitor  DERMATOLOGY A: 04/14/2012: Has 5 cm stage II skin tear at next to the ostomy P: - Monitor Wound care consult  SYMPTOMS: Pain 04/09/2012: He has significant pain that appears under treated. Wife not happy with current level of pain control. According to nursing he is looking at the clock every 2 hours for pain medication. The pain seems to be back and neuromuscular otherwise does not want to give him any muscular relaxants. He is happy with fentanyl. I will ensure that he gets fentanyl scheduled at night and as needed during the daytime and the wife is present at bedside in the daytime. As of the fentanyl patch we'll place a lidocaine patch in the shoulder where he has pain.  3/18 Persistent pain issues, will add q6 low dose roxanol to hopefully decrease the amount of fentanyl IV uses.  3/19 Oxycodone resulted in N/V, will attempt methadone in its place at very low dose.  3/20 patient had some n/v with methadone but clearly pain has been better, I suspect that N/V were due to high volume of TF.  Patient passed swallow evaluation but his PO intake is simply too minor to sustain caloric intake.  Spoke with the family and patient, will continue with the methadone for now and decrease TF to 40 ml/hr.  Goal is to d/c TF all together once is able to full take diet PO.  Also complains of reflux.  Will double protonix but doubt will improve as long as NGT is in place.  PRN maalox.  04/13/2012 - appears to be tolerating and improving with  methadone. Allow only for slow titration of methadone upwards every 5 day  03/23- Several comfort measures addressed. Tolerance of pain meds is borderline.  GLOBAL: 04/09/2012: Inpatient  rehabilitation consultation requested but likely will not be til next week. 04/15/2012: Wife updated 3/24 - once he gets off scheduled pain meds for 24h he can go to rehab     Dr. Kalman Shan, M.D., [facility].C.P Pulmonary and Critical Care Medicine Staff Physician [facility]  Pulmonary and Critical Care Pager: [phone], If no answer or between  15:00h - 7:00h: call [phone]  04/16/2012 11:16 AM

## 2012-04-16 NOTE — Progress Notes (Signed)
NUTRITION  CONSULT / FOLLOW UP  Intervention:   1.  General healthful diet; encourage intake of food and pudding thick beverages.  Discussed nutrition status, concerns, and goal with wife at bedside.  Wife is highly motivated to assist pt in meeting needs.  Discussed variety of options/tactics which can be used to increased kcal/protein intake including supplements, calorically dense foods and beverages, frequent snacks, and pureed meats. RD to follow.   Nutrition Dx:   Inadequate oral intake related to swallowing difficulty and altered GI function as evidenced by NPO status- ongoing.  Goal: EN to meet >90% of estimated nutrition needs- met.  Monitor:   TF tolerance, weight trend, labs, wound healing.  Assessment: 30 YOM initially admitted to [facility] for Cdiff on 2/2. Pt underwent total abdominal colectomy and end ileostomy. Post op pt developed ARDS and septic shock and was being treated for this. The morning of 2/6 he had new onset of A-fib and transferred to [facility]. Course complicated by severe hypoalbuminemia, elevated LFTs -cholestatic without biliary obstruction & pre-renal failure.   Pt followed by SLP last week due to inability to advance diet.  Pt has now resumed Dysphagia 3, pudding thick liquids.    Pt with variable wt since admission.  Admission wt 200 lbs.  Pt underwent permissive underfeeding x3 weeks as appropriate for critically ill obese pt and initiated advancement to full feeds (2/21-2/26).  Feeds were held for surgery (2/26-3/3), resumed with pt reaching goal rate (3/4).  TFs again held (3/11) for GI bleed, resumed (3/12).   Pt now weighs 168 lbs. Stool output is appropriate, no indication of malabsorption. Pt with some progress in PT.  MD has ordered cyclic feeds overnight for pt- Vital 1.2 @ 40 mL/hr continuous from 8p-8a which provides 1152 kcal, 72g protein, and 736 mL free water.  Pt's wife is requesting that Loni Muse be removed due to pt experiencing discomfort.  Wife  reports that the only reason pt still has the tube is due to "emergency surgery which was a major set back."  RD discussed pt's slow progress with SLP last week and pt's nutrition needs with ongoing healing and increased therapy demands.  Wife is strong advocating for tube removal "to give him a chance."  Pt complains of tasting plastic, throat tenderness, and severe heart burn which he believes is causing headaches. MD arrived to assess pt during visit and removed Panda.  Pt qualifies for severe malnutrition of acute illness based on 17.5% wt loss in >1 month and PO/nutrition support meeting </=75% of estimated needs for >1 month.   During visit, pt is lethargic and keeps eyes closed- he did just receive fentanyl.  Wife states pt was able to eat 75% of muffin and 40% of yogurt this morning.    Height: Ht Readings from Last 1 Encounters:  03/25/12 5' 9"  (1.753 m)    Weight Status:   Wt Readings from Last 1 Encounters:  04/16/12 168 lb 14 oz (76.6 kg)  03/26/12  177 lb 4 oz (80.4 kg)  03/22/12  186 lb 8.2 oz (84.6 kg)  03/21/12  179 lb 7.3 oz (81.4 kg)  03/16/12  192 lb 0.3 oz (87.1 kg)  Admit wt 200 lb   Body mass index is 24.93 kg/(m^2). Overweight.  Re-estimated needs:  Kcal: 2300 - 2500  Protein: 140 - 160 gm  Fluid: >/= 2.3 L  Skin: abdominal incision/wound closed in OR 3/11, skin tear right buttocks   Diet Order: Dysphagia 3, pudding thick  liquids    Intake/Output Summary (Last 24 hours) at 04/16/12 1034 Last data filed at 04/16/12 1000  Gross per 24 hour  Intake   1640 ml  Output   1550 ml  Net     90 ml    Last BM: 04/16/2012  Labs:   Recent Labs Lab 04/11/12 0330 04/12/12 0435 04/13/12 0410 04/16/12 0549  NA 152* 155* 154* 155*  K 3.6 4.3 3.3* 3.5  CL 122* 122* 121* 124*  CO2 23 23 23 22   BUN 58* 58* 56* 45*  CREATININE 1.27 1.37* 1.28 1.25  CALCIUM 8.3* 8.5 8.4 8.5  MG 1.9 1.8 2.3  --   PHOS 3.1 3.0 3.3  --   GLUCOSE 137* 94 121* 124*    CBG  (last 3)   Recent Labs  04/15/12 0013 04/15/12 0340 04/15/12 0754  GLUCAP 83 106* 108*    Scheduled Meds: . antiseptic oral rinse  15 mL Mouth Rinse q12n4p  . chlorhexidine  15 mL Mouth Rinse BID  . feeding supplement  237 mL Oral BID BM  . feeding supplement (VITAL AF 1.2 CAL)  1,000 mL Per Tube Q24H  . fentaNYL  25-100 mcg Intravenous Q2H  . free water  300 mL Per Tube Q6H  . lidocaine  1 patch Transdermal Q24H  . methadone  5 mg Per Tube BID  . pantoprazole  40 mg Oral BID AC  . sodium chloride  10-40 mL Intracatheter Q12H    Continuous Infusions: . sodium chloride 20 mL/hr (04/10/12 0239)    Brynda Greathouse, MS RD LDN Clinical Inpatient Dietitian Pager: [phone] Weekend/After hours pager: [phone]

## 2012-04-16 NOTE — Progress Notes (Signed)
Occupational Therapy Treatment Patient Details Name: Connor Small MRN: [id number] DOB: May 15, 1960 Today's Date: 04/16/2012 Time: 6578-4696 OT Time Calculation (min): 45 min  OT Assessment / Plan / Recommendation Comments on Treatment Session Pt demonstrating improvement in B UE strength, trunk strength and control, head control in sitting.  Limited by increased HR with activity (145) requiring frequent rest breaks.  Goals assessed and continue to be appropriate.    Follow Up Recommendations  CIR    Barriers to Discharge       Equipment Recommendations  3 in 1 bedside comode;Tub/shower bench    Recommendations for Other Services Rehab consult  Frequency Min 3X/week   Plan Discharge plan remains appropriate    Precautions / Restrictions Precautions Precautions: Fall Precaution Comments: Abd binder, ostomy, panda Required Braces or Orthoses: Other Brace/Splint Other Brace/Splint: R wrist cock up slint Restrictions Weight Bearing Restrictions: No Other Position/Activity Restrictions: support R UE   Pertinent Vitals/Pain HR in upper 120s at rest,increased to 145 with attempt to stand.    ADL  ADL Comments: Family not available for education on ROM this session.    OT Diagnosis:    OT Problem List:   OT Treatment Interventions:     OT Goals Acute Rehab OT Goals Time For Goal Achievement: 04/30/12 Potential to Achieve Goals: Good ADL Goals Pt Will Perform Grooming: with mod assist;Sitting, chair;Supported;with cueing (comment type and amount) ADL Goal: Grooming - Progress: Not met Pt Will Perform Upper Body Bathing: with mod assist;Sitting, chair;Supported;with cueing (comment type and amount) ADL Goal: Upper Body Bathing - Progress: Not met Additional ADL Goal #1: Pt will complete bed mobility with mod A in preparation for ADL.  ADL Goal: Additional Goal #1 - Progress: Progressing toward goals Additional ADL Goal #2: Pt will sit EOB with min A supported in preparation  for ADL ADL Goal: Additional Goal #2 - Progress: Progressing toward goals Arm Goals Pt Will Complete Theraputty Exer: to increase strength;Bilateral upper extremities;with minimal assist;Other (comment) Arm Goal: Theraputty Exercises - Progress: Not met Additional Arm Goal #1: Wife will complete P/AAROM BUE with S Arm Goal: Additional Goal #1 - Progress: Not met Additional Arm Goal #2: complete PNF D1/D2 patterns with LUE in preparation for ADL with min A in supine. Arm Goal: Additional Goal #2 - Progress: Not met Miscellaneous OT Goals Miscellaneous OT Goal #1: Wife will complete edema control techniques with S R hand OT Goal: Miscellaneous Goal #1 - Progress: Not met Miscellaneous OT Goal #2: Demonstrate functional grasp/release R hand with use of orthotic with min A  OT Goal: Miscellaneous Goal #2 - Progress: Progressing toward goals Miscellaneous OT Goal #3: Pt will tolerate r wrist cock up splint without c/o during the day. Wife indpenednt with donning/ doffing splint. OT Goal: Miscellaneous Goal #3 - Progress: Met Miscellaneous OT Goal #4: Using left hand. feed self ice chips with set up OT Goal: Miscellaneous Goal #4 - Progress: Not met  Visit Information  Last OT Received On: 04/16/12 Assistance Needed: +2 PT/OT Co-Evaluation/Treatment: Yes    Subjective Data      Prior Functioning  Home Living Available Help at Discharge:  (wife on FMLA as school teacher. Does not plan to return '14) Additional Comments: wife fmla, family close by and will help; his brother Prior Function Able to Take Stairs?: Yes Driving: Yes Vocation: Full time employment    Cognition  Cognition Overall Cognitive Status: Impaired Area of Impairment: Attention;Problem solving Arousal/Alertness: Awake/alert Following Commands: Follows one step commands consistently  Cognition - Other Comments: Worked with pt in low stimulus setting.    Mobility  Bed Mobility Bed Mobility: Rolling Right;Rolling  Left;Left Sidelying to Sit;Sitting - Scoot to [place] of Bed;Sit to Supine;Scooting to [facility] Rolling Right: 1: +2 Total assist;With rail Rolling Right: Patient Percentage: 20% Rolling Left: 1: +2 Total assist Rolling Left: Patient Percentage: 10% Left Sidelying to Sit: 1: +2 Total assist Left Sidelying to Sit: Patient Percentage: 10% Sitting - Scoot to Edge of Bed: 1: +1 Total assist Sitting - Scoot to Edge of Bed: Patient Percentage: 0% Sit to Supine: 1: +2 Total assist Sit to Supine: Patient Percentage: 10% Scooting to HOB: 1: +2 Total assist Scooting to [facility]: Patient Percentage: 0% Transfers Transfers: Sit to Stand;Stand to Sit Sit to Stand: 1: +2 Total assist;From elevated surface (performed x 2 to partial stand) Sit to Stand: Patient Percentage: 10% Stand to Sit: 1: +2 Total assist;To bed Stand to Sit: Patient Percentage: 10%    Exercises  General Exercises - Upper Extremity Shoulder Flexion: AAROM;Supine;5 reps Shoulder ABduction: PROM;5 reps;Seated Elbow Flexion: AAROM;Supine;5 reps Elbow Extension: AAROM;5 reps;Supine Wrist Flexion: PROM;5 reps;Supine Wrist Extension: PROM;5 reps;Supine Digit Composite Flexion: AAROM;5 reps;Supine Composite Extension: PROM;5 reps;Supine   Balance Balance Balance Assessed: Yes Static Sitting Balance Static Sitting - Balance Support: Bilateral upper extremity supported;Feet supported Static Sitting - Level of Assistance: 5: Stand by assistance;2: Max assist (required more assist with fatigue) Static Sitting - Comment/# of Minutes: 15 Dynamic Sitting Balance Dynamic Sitting - Balance Support: Left upper extremity supported;Feet supported;Right upper extremity supported Dynamic Sitting - Level of Assistance: 4: Min assist (pt moving away from midline all directions 3-6 inches and recovering)   End of Session OT - End of Session Activity Tolerance: Patient limited by fatigue Patient left: in bed;with call bell/phone within reach  GO      Evern Bio 04/16/2012, 3:04 PM [phone]

## 2012-04-16 NOTE — Consult Note (Addendum)
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 Peristomal assessment: Intact skin surrounding stoma. Area of mucocutaneous separation to stoma is slowly improving, refer to previous progress note.  Treatment options for stomal/peristomal skin: Partial thickness wound to right of pouching area is pink and dry and healing. No further drainage, less tender when touched.  Foam dressing applied to absorb drainage and promote healing. Output 50cc dark brown liquid stool Ostomy pouching: 2 piece with barrier ring. Education provided: Wife at bedside for teaching session.  She is able to open and close velcro.  Pt familiar with pouching routines from previous stoma. Applied two piece pouching system with barrier ring to maintain seal.  Pt plans to transfer to rehab, will continue to follow for further educational sessions if remaining at [facility]. Supplies at bedside for staff use.  Julien Girt MSN, RN, [place], [place], [place]

## 2012-04-16 NOTE — Progress Notes (Signed)
15 Days Post-Op  Subjective: C/O heart burn and HA, eating better per family  Objective: Vital signs in last 24 hours: Temp:  [98 F (36.7 C)-98.3 F (36.8 C)] 98.1 F (36.7 C) (03/24 0354) Pulse Rate:  [99-111] 106 (03/24 0731) Resp:  [14-19] 14 (03/24 0731) BP: (98-110)/(60-74) 103/60 mmHg (03/24 0731) SpO2:  [97 %-99 %] 99 % (03/24 0731) Weight:  [76.6 kg (168 lb 14 oz)] 76.6 kg (168 lb 14 oz) (03/24 0354) Last BM Date: 04/15/12  Intake/Output from previous day: 03/23 0701 - 03/24 0700 In: 1580 [I.V.:500; NG/GT:1080] Out: 1300 [Urine:475; Stool:825] Intake/Output this shift:    General appearance: cooperative Nose: panda in place  Resp: clear to auscultation bilaterally GI: soft, NT, wound clean and healing - much improved since my last exam  Lab Results:   Recent Labs  04/15/12 0839 04/16/12 0549  WBC 5.9 8.8  HGB 8.4* 7.6*  HCT 27.0* 24.7*  PLT 185 212   BMET  Recent Labs  04/16/12 0549  NA 155*  K 3.5  CL 124*  CO2 22  GLUCOSE 124*  BUN 45*  CREATININE 1.25  CALCIUM 8.5   PT/INR No results found for this basename: LABPROT, INR,  in the last 72 hours ABG No results found for this basename: PHART, PCO2, PO2, HCO3,  in the last 72 hours  Studies/Results: Dg Chest Port 1 View  04/16/2012  *RADIOLOGY REPORT*  Clinical Data: Hypoventilation.  PORTABLE CHEST - 1 VIEW  Comparison: 04/09/2012  Findings: 0523 hours.  Lung volumes are low.  Left base collapse / consolidation with effusion again noted.  NG tube has been removed in the interval and the tip of the feeding tube now overlies the mid stomach.  Left IJ central line tip projects over the proximal SVC region. Telemetry leads overlie the chest.  IMPRESSION: Interval removal of NG tube with placement of feeding tube with tip projecting over the mid stomach.  Stable cardiopulmonary exam.   Original Report Authenticated By: Misty Stanley, M.D.     Anti-infectives: Anti-infectives   Start     Dose/Rate  Route Frequency Ordered Stop   04/01/12 1030  ciprofloxacin (CIPRO) IVPB 400 mg     400 mg 200 mL/hr over 60 Minutes Intravenous To Surgery 04/01/12 1028 04/01/12 1058   03/26/12 2100  vancomycin (VANCOCIN) IVPB 1000 mg/200 mL premix  Status:  Discontinued     1,000 mg 200 mL/hr over 60 Minutes Intravenous Every 24 hours 03/25/12 1934 03/28/12 0913   03/26/12 0945  ciprofloxacin (CIPRO) IVPB 400 mg  Status:  Discontinued     400 mg 200 mL/hr over 60 Minutes Intravenous Every 12 hours 03/26/12 0933 03/29/12 1331   03/26/12 0600  aztreonam (AZACTAM) 1 g in dextrose 5 % 50 mL IVPB  Status:  Discontinued     1 g 100 mL/hr over 30 Minutes Intravenous 3 times per day 03/25/12 1934 03/26/12 0929   03/25/12 2200  metroNIDAZOLE (FLAGYL) IVPB 500 mg  Status:  Discontinued     500 mg 100 mL/hr over 60 Minutes Intravenous Every 8 hours 03/25/12 1909 03/28/12 0913   03/25/12 2000  vancomycin (VANCOCIN) IVPB 1000 mg/200 mL premix     1,000 mg 200 mL/hr over 60 Minutes Intravenous STAT 03/25/12 1933 03/25/12 2054   03/25/12 2000  aztreonam (AZACTAM) 1 g in dextrose 5 % 50 mL IVPB     1 g 100 mL/hr over 30 Minutes Intravenous STAT 03/25/12 1933 03/25/12 2024   03/20/12 1145  ciprofloxacin (CIPRO) IVPB 400 mg    Comments:  ON CALL TO OR   400 mg 200 mL/hr over 60 Minutes Intravenous  Once 03/20/12 1143 03/20/12 1722   03/18/12 1000  vancomycin (VANCOCIN) 1,500 mg in sodium chloride 0.9 % 500 mL IVPB  Status:  Discontinued     1,500 mg 250 mL/hr over 120 Minutes Intravenous Every 48 hours 03/17/12 1920 03/19/12 0840   03/14/12 1000  micafungin (MYCAMINE) 100 mg in sodium chloride 0.9 % 100 mL IVPB  Status:  Discontinued     100 mg 100 mL/hr over 1 Hours Intravenous Daily 03/13/12 0632 03/19/12 0840   03/13/12 1000  micafungin (MYCAMINE) 100 mg in sodium chloride 0.9 % 100 mL IVPB     100 mg 100 mL/hr over 1 Hours Intravenous Daily 03/13/12 0632 03/13/12 1311   03/12/12 1800  vancomycin (VANCOCIN)  IVPB 1000 mg/200 mL premix  Status:  Discontinued     1,000 mg 200 mL/hr over 60 Minutes Intravenous Every 24 hours 03/12/12 0859 03/17/12 1017   03/12/12 1000  imipenem-cilastatin (PRIMAXIN) 500 mg in sodium chloride 0.9 % 100 mL IVPB  Status:  Discontinued     500 mg 200 mL/hr over 30 Minutes Intravenous 3 times per day 03/12/12 0848 03/12/12 0909   03/12/12 1000  imipenem-cilastatin (PRIMAXIN) 250 mg in sodium chloride 0.9 % 100 mL IVPB  Status:  Discontinued     250 mg 200 mL/hr over 30 Minutes Intravenous 4 times per day 03/12/12 0909 03/19/12 0841   03/11/12 1200  vancomycin (VANCOCIN) 750 mg in sodium chloride 0.9 % 150 mL IVPB  Status:  Discontinued     750 mg 150 mL/hr over 60 Minutes Intravenous Every 12 hours 03/11/12 0204 03/12/12 0858   03/11/12 0215  vancomycin (VANCOCIN) 750 mg in sodium chloride 0.9 % 150 mL IVPB     750 mg 150 mL/hr over 60 Minutes Intravenous  Once 03/11/12 0204 03/11/12 0352   03/11/12 0215  aztreonam (AZACTAM) 1 g in dextrose 5 % 50 mL IVPB  Status:  Discontinued     1 g 100 mL/hr over 30 Minutes Intravenous 3 times per day 03/11/12 0204 03/12/12 0851   03/05/12 2200  metroNIDAZOLE (FLAGYL) IVPB 500 mg  Status:  Discontinued    Comments:  First dose ASAP   500 mg 100 mL/hr over 60 Minutes Intravenous Every 8 hours 03/05/12 1346 03/19/12 1509   03/02/12 1200  vancomycin (VANCOCIN) 50 mg/mL oral solution 500 mg  Status:  Discontinued     500 mg Oral 4 times per day 03/02/12 0913 03/19/12 1509   03/01/12 1445  vancomycin (VANCOCIN) 50 mg/mL oral solution 500 mg  Status:  Discontinued     500 mg Oral 3 times per day 03/01/12 1340 03/02/12 0913   03/01/12 1400  aztreonam (AZACTAM) 1 g in dextrose 5 % 50 mL IVPB  Status:  Discontinued     1 g 100 mL/hr over 30 Minutes Intravenous 3 times per day 03/01/12 1356 03/02/12 1146   03/01/12 1300  vancomycin (VANCOCIN) 1,250 mg in sodium chloride 0.9 % 250 mL IVPB  Status:  Discontinued     1,250 mg 166.7 mL/hr  over 90 Minutes Intravenous Every 12 hours 03/01/12 1127 03/02/12 1146   02/29/12 0600  ciprofloxacin (CIPRO) IVPB 400 mg     400 mg 200 mL/hr over 60 Minutes Intravenous On call to O.R. 02/28/12 1409 02/28/12 1438   02/28/12 2200  vancomycin (VANCOCIN) 500 mg in sodium  chloride irrigation 0.9 % 60 mL ENEMA  Status:  Discontinued     500 mg Rectal 2 times daily 02/28/12 1504 02/28/12 1817   02/28/12 1200  vancomycin (VANCOCIN) 50 mg/mL oral solution 500 mg  Status:  Discontinued    Comments:  First dose ASAP   500 mg Per Tube 4 times per day 02/28/12 0814 02/28/12 1817   02/28/12 1000  vancomycin (VANCOCIN) IVPB 1000 mg/200 mL premix  Status:  Discontinued     1,000 mg 200 mL/hr over 60 Minutes Intravenous Every 12 hours 02/28/12 0845 03/01/12 1127   02/28/12 1000  vancomycin (VANCOCIN) 500 mg in sodium chloride irrigation 0.9 % 100 mL ENEMA  Status:  Discontinued     500 mg Rectal 2 times daily 02/28/12 0923 02/28/12 1504   02/27/12 1600  metroNIDAZOLE (FLAGYL) IVPB 500 mg  Status:  Discontinued     500 mg 100 mL/hr over 60 Minutes Intravenous Every 8 hours 02/27/12 1546 02/27/12 2000   02/27/12 1600  ciprofloxacin (CIPRO) IVPB 400 mg  Status:  Discontinued     400 mg 200 mL/hr over 60 Minutes Intravenous Every 12 hours 02/27/12 1547 02/27/12 2000   02/27/12 1545  metroNIDAZOLE (FLAGYL) IVPB 500 mg  Status:  Discontinued     500 mg 100 mL/hr over 60 Minutes Intravenous  Once 02/27/12 1535 02/28/12 0843   02/27/12 1545  vancomycin (VANCOCIN) 500 mg in sodium chloride 0.9 % 100 mL IVPB  Status:  Discontinued     500 mg 100 mL/hr over 60 Minutes Intravenous  Once 02/27/12 1535 02/27/12 1538   02/27/12 1545  ciprofloxacin (CIPRO) IVPB 400 mg  Status:  Discontinued     400 mg 200 mL/hr over 60 Minutes Intravenous  Once 02/27/12 1538 02/28/12 0843   02/27/12 1415  vancomycin (VANCOCIN) 500 mg in sodium chloride 0.9 % 100 mL IVPB     500 mg 100 mL/hr over 60 Minutes Intravenous  Once  02/27/12 1402 02/27/12 1411   02/27/12 1400  vancomycin (VANCOCIN) powder 500 mg  Status:  Discontinued     500 mg Other To Surgery 02/27/12 1359 02/27/12 1402   02/27/12 1200  metroNIDAZOLE (FLAGYL) IVPB 500 mg  Status:  Discontinued    Comments:  First dose ASAP   500 mg 100 mL/hr over 60 Minutes Intravenous Every 6 hours 02/27/12 0953 03/05/12 1346   02/27/12 1200  vancomycin (VANCOCIN) 50 mg/mL oral solution 250 mg  Status:  Discontinued    Comments:  First dose ASAP   250 mg Oral 4 times per day 02/27/12 0953 02/28/12 0814   02/26/12 1700  oseltamivir (TAMIFLU) capsule 75 mg     75 mg Oral  Once 02/26/12 1651 02/26/12 1715      Assessment/Plan: s/p Procedure(s): removal of abdominal vac dressing and abdominal closure (N/A) 1. S/p open abdomen with closure. 2. Dysphagia, TF  Plan: 1. Continue current would care. 2. Cont TFs at 32NV/BT cyclically from 6O-0A until oral intake satisfactory per calorie count. 3. HA and reflux due to panda 4. CIR  LOS: 50 days    Marlynn Hinckley E 04/16/2012

## 2012-04-16 NOTE — PMR Pre-admission (Signed)
PMR Admission Coordinator Pre-Admission Assessment  Patient: Connor Small is an 52 y.o., male MRN: [id number] DOB: 1960/11/22 Height: 5\' 9"  (175.3 cm) Weight: 76.6 kg (168 lb 14 oz)              Insurance Information HMO:     PPO: yes     PCP:      IPA:      80/20:      OTHER:  PRIMARY: [employer] [place]      Policy#: [id number]      Subscriber: pt CM Name: Victorio Palm      Phone#: [phone]     Fax#: [phone] Pre-Cert#: [id number]      Employer: [employer]. Update needed 4/2 after conference Benefits:  Phone #: [phone]     Name: 04/13/12 Clarisse Eff. Date: 01/25/2012 active     Deduct: $500/met      Out of Pocket Max: $3500/met      Life Max: unlimited CIR: 80%      SNF: 80% Outpatient: 80%     Co-Pay:  90 visits per benefit with PT and OT combined, SLP 90 visits seperate Home Health: 80%      Co-Pay: same as per OP DME: 80%     Co-Pay: 20% Providers: in network  SECONDARY: none      Medicaid Application Date:       Case Manager:  Disability Application Date:       Case Worker:   Emergency Conservator, museum/gallery Information   Name Relation Home Work Mobile   [place] Spouse [phone]   Fellner,Shandale Daughter   [phone]     Current Medical History  Patient Admitting Diagnosis: severe deconditioning after 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 right-handed male admitted 02/26/2012 with low back pain, fever 102.5 and multiple episodes of watery diarrhea. Patient had been exposed to a coworker with similar illness. He was vaccinated for influenza in October. Patient noted myalgias, sore throat and nausea with poor appetite. CT of the abdomen showed no evidence of bowel obstruction with noted prior distal colonic resection with re\re anastomosis and noted history of ulcerated colitis. Underwent flexible sigmoidoscopy 02/27/2012 per Dr.Rehman with findings of multiple  pseudomembranes consistent with C. difficile colitis as well as low colorectal stricture. Underwent exploratory laparotomy,cecostomy tube placement per Dr. Dian Situ 02/27/2012 as well as total abdominal colectomy with an ileostomy and intra-abdominal drain placement. Postoperatively he developed ARDS and was intubated 02/28/2012 at 03/15/2012 and sepsis transferred to the care critical services as well his new onset atrial fibrillation.  Patient maintained on broad-spectrum antibiotics. Hospital course with acute renal failure with renal services consulted 03/12/2012 and creatinine 2.74 likely secondary to ischemic ATN in light of intravascular volume depletion and required CVVHD from 03/12/2012 at 03/15/2012. Patient developed DVT involving right basilic vein after PICC line PICC line had been on argatoban and monitoring of platelets. Patient with intermittent bouts of altered mental status as well as global weakness. Neurology services consulted MRI of the brain showed scattered small acute to subacute infarcts in both cerebral hemispheres, brainstem and right cerebellum.  Patient with complaints of right shoulder pain x-rays had revealed inferior subluxation consult obtain orthopedic services Dr.Yates and not felt to be dislocated. Shoulder was reduced with good passive range of motion and advised to continue occupational therapy. Patient with ongoing pain issues. Nausea with oxycodone. Began on methadone. Receivng Fentanyl prn IV for pain issues  04/16/12. Pt complains heat burn and issues with panda causing his ongoing discomfort. Panda d/c's on 3/24. Plans for [facility] 3/25. Thrush in mouth and dysphagia noted with some pain.  Fentanyl not in use for 24 hrs since early am 3/24. Good pain control with methadone.  Past Medical History  Past Medical History  Diagnosis Date  . Chronic diarrhea   . Rectal bleed   . Hemorrhoids   . Ulcerative colitis     Distal UC over 8 yrs ago diagnosed  . Diverticulitis of  large intestine with perforation 10/2011    done at [facility]    Family History  family history includes Cancer in his sister; Healthy in his daughter; and Hypertension in his mother.  Prior Rehab/Hospitalizations:none   Current Medications  Current facility-administered medications:0.45 % sodium chloride infusion, , Intravenous, Continuous PRN, Nelda Bucks, MD, Last Rate: 20 mL/hr at 04/10/12 0239, 20 mL/hr at 04/10/12 0239;  alum & mag hydroxide-simeth (MAALOX/MYLANTA) 200-200-20 MG/5ML suspension 15 mL, 15 mL, Per Tube, Q4H PRN, Roxine Caddy, MD, 15 mL at 04/17/12 0856 antiseptic oral rinse (BIOTENE) solution 15 mL, 15 mL, Mouth Rinse, q12n4p, Nelda Bucks, MD, 15 mL at 04/16/12 1600;  chlorhexidine (PERIDEX) 0.12 % solution 15 mL, 15 mL, Mouth Rinse, BID, Nelda Bucks, MD, 15 mL at 04/17/12 [phone];  feeding supplement (ENSURE COMPLETE) liquid 237 mL, 237 mL, Oral, BID BM, Letha Cape, PA-C, 237 mL at 04/17/12 0855 feeding supplement (VITAL AF 1.2 CAL) liquid 1,000 mL, 1,000 mL, Per Tube, Q24H, Letha Cape, PA-C, Last Rate: 40 mL/hr at 04/15/12 2006, 1,000 mL at 04/15/12 2006;  fentaNYL (SUBLIMAZE) injection 25-100 mcg, 25-100 mcg, Intravenous, Q2H PRN, Kalman Shan, MD;  free water 300 mL, 300 mL, Per Tube, Q4H, Kalman Shan, MD;  lidocaine (LIDODERM) 5 % 1 patch, 1 patch, Transdermal, Q24H, Kalman Shan, MD, 1 patch at 04/16/12 1146 methadone (DOLOPHINE) tablet 5 mg, 5 mg, Per Tube, Q8H, Kalman Shan, MD, 5 mg at 04/17/12 0548;  ondansetron (ZOFRAN-ODT) disintegrating tablet 4 mg, 4 mg, Oral, Q8H PRN, Zigmund Gottron, MD, 4 mg at 04/17/12 0218;  pantoprazole (PROTONIX) EC tablet 40 mg, 40 mg, Oral, BID AC, Nelda Bucks, MD, 40 mg at 04/17/12 0547;  RESOURCE THICKENUP CLEAR, , Oral, PRN, Alyson Reedy, MD sodium chloride 0.9 % injection 10-40 mL, 10-40 mL, Intracatheter, Q12H, Christiane Ha, MD, 10 mL at 04/17/12 0855;  sodium chloride  0.9 % injection 10-40 mL, 10-40 mL, Intracatheter, PRN, Christiane Ha, MD, 10 mL at 04/07/12 1101  Patients Current Diet: Dysphagia 3 diet with pudding thick liquids. MBS pending 3/25. Panda d/c'd 3/24  Precautions / Restrictions Precautions Precautions: Fall Precaution Comments: Abd binder, ostomy, panda Other Brace/Splint: R wrist cock up slint Restrictions Weight Bearing Restrictions: No Other Position/Activity Restrictions: support R UE   Prior Activity Level Community (5-7x/wk): pt working Research officer, trade union / Equipment Home Assistive Devices/Equipment: None  Prior Functional Level Prior Function Level of Independence: Independent Able to Take Stairs?: Yes Driving: Yes Vocation: Full time employment  Current Functional Level Cognition  Arousal/Alertness: Awake/alert Overall Cognitive Status: Impaired Difficult to assess due to: Impaired communication Current Attention Level: Selective Attention - Other Comments: difficulty attending to session when needs to clear secretions. Memory Deficits: difficulty recalling previous sessions and how he has progressed; does recall being dropped and still fearful Orientation Level: Oriented X4 Following Commands: Follows one step commands consistently Cognition - Other Comments:  Worked with pt in low stimulus setting.    Extremity Assessment (includes Sensation/Coordination)  RUE ROM/Strength/Tone: Deficits RUE ROM/Strength/Tone Deficits: active shoulder shrug oncommand. min gross grasp with wrist flexion. no active release. no active elbow flex.ext. no increase in tone. subluxed (inferior)shoulder RUE Coordination: Deficits RUE Coordination Deficits: nonfunctional arm/hand  RLE ROM/Strength/Tone: Deficits (femoral line) RLE ROM/Strength/Tone Deficits: P/ROM WFL    ADLs  Eating/Feeding: Maximal assistance (hand over hand using L hand) Where Assessed - Eating/Feeding: Chair Grooming: Maximal  assistance Where Assessed - Grooming: Supported sitting (combing hair with L hand. Hand over hand) Upper Body Bathing: +1 Total assistance Lower Body Bathing: +1 Total assistance Upper Body Dressing: +1 Total assistance Lower Body Dressing: +1 Total assistance Toilet Transfer: +2 Total assistance;Simulated Transfers/Ambulation Related to ADLs: +2 total A. Using NDT over the back technique ADL Comments: Family not available for education on ROM this session.    Mobility  Bed Mobility: Rolling Right;Rolling Left;Left Sidelying to Sit;Sitting - Scoot to [employer] of Bed;Sit to Supine;Scooting to [facility] Rolling Right: 1: +2 Total assist;With rail Rolling Right: Patient Percentage: 20% Rolling Left: 1: +2 Total assist Rolling Left: Patient Percentage: 10% Left Sidelying to Sit: 1: +2 Total assist Left Sidelying to Sit: Patient Percentage: 10% Supine to Sit: 1: +1 Total assist Sitting - Scoot to Edge of Bed: 1: +1 Total assist Sitting - Scoot to Edge of Bed: Patient Percentage: 0% Sit to Supine: 1: +2 Total assist Sit to Supine: Patient Percentage: 10% Scooting to HOB: 1: +2 Total assist Scooting to [facility]: Patient Percentage: 0%    Transfers  Transfers: Lateral/Scoot Transfers Sit to Stand: 2: Max assist Sit to Stand: Patient Percentage: 10% Stand to Sit: 1: +2 Total assist;To bed Stand to Sit: Patient Percentage: 10% Squat Pivot Transfers: 1: +2 Total assist Squat Pivot Transfers: Patient Percentage: 20% Lateral/Scoot Transfers: 1: +2 Total assist Lateral Transfers: Patient Percentage: 10%    Ambulation / Gait / Stairs / Wheelchair Mobility  Ambulation/Gait Ambulation/Gait Assistance: Not tested (comment) Stairs: No Wheelchair Mobility Wheelchair Mobility: No    Posture / Games developer Sitting - Balance Support: Bilateral upper extremity supported;Feet supported Static Sitting - Level of Assistance: 5: Stand by assistance;2: Max assist (required more assist with  fatigue) Static Sitting - Comment/# of Minutes: 15; varying levels of support given depending on level of fatigue, getting positioned for self support and for getting improved head control Dynamic Sitting Balance Dynamic Sitting - Balance Support: Left upper extremity supported;Feet supported;Right upper extremity supported Dynamic Sitting - Level of Assistance: 4: Min assist (pt moving away from midline all directions 3-6 inches)    Special needs/care consideration  Skin abd wound with packing bid Bowel mgmt: ileostomy to right lower quad.wife has been receiving teaching for its care with WOC. Patient previously had ostomy also. Bladder mgmt: urinal which wife assists with   Previous Home Environment Living Arrangements: Spouse/significant other Lives With: Spouse Available Help at Discharge:  (wife on FMLA as school Runner, broadcasting/film/video. Does not plan to return '14) Type of Home: House Home Layout: One level (with basement) Home Access: Stairs to enter Entrance Stairs-Rails: None Entrance Stairs-Number of Steps: 1 step onto porch and one step into house [employer] Shower/Tub: Health visitor: Standard Bathroom Accessibility: Yes How Accessible: Accessible via walker Home Care Services: No Additional Comments: wife fmla, family close by and will help; his brother  Discharge Living Setting Plans for Discharge Living Setting: Patient's home;Lives with (comment) (wife) Do you have any  problems obtaining your medications?: No  Social/Family/Support Systems Patient Roles: Spouse;Parent;Other (Comment) (employee) Contact Information: Murrell Converse, wife Anticipated Caregiver: wife, brother, dtr, and other family members Anticipated Caregiver's Contact Information: see above Ability/Limitations of Caregiver: wife FMLA from school system the rest of this school year Caregiver Availability: 24/7 Discharge Plan Discussed with Primary Caregiver: Yes Is Caregiver In Agreement with Plan?:  Yes Does Caregiver/Family have Issues with Lodging/Transportation while Pt is in Rehab?: No (wife remains on hospital with pt 24/7) Wife has stayed in hospital for 51 days with pt at his side. Very involved.  Goals/Additional Needs Patient/Family Goal for Rehab: MIn to mod PT, min to Mod OT, Mod I to supervision with SLP Expected length of stay: ELOS 3 to 4 weeks Dietary Needs: 3/24 Dyaphagia 3 diet with pudding thick liquids. MBS 3/25 pending, panda feeds as supplement Special Service Needs: WOC nurse teaching for ostomy care Pt/Family Agrees to Admission and willing to participate: Yes Program Orientation Provided & Reviewed with Pt/Caregiver Including Roles  & Responsibilities: Yes   Decrease burden of Care through IP rehab admission: would like to one caregiver support form two caregiver support currently  Possible need for SNF placement upon discharge:not anticipated. I discussed with pt and with spouse that BCBS likely not approve and pay for both inpt rehab and SNF if pt does not reach goals to go directly home from CIR.   Patient Condition: This patient's condition remains as documented in the Consult dated  04/10/12, in which the Rehabilitation Physician determined and documented that the patient's condition is appropriate for intensive rehabilitative care in an inpatient rehabilitation facility. Medically see history of present illness above for medical updates. Functionally pt able to tolerate sitting in chair for longer periods. Max assist overall with bed to chair transfers and siiting edge of bed unsupported for longer periods. Standing trials able to satnd more erect than before.  Pt's medical and functional status has been discussed with the rehabilitation physician and pt is still appropriate for intensive rehabilative care in an IP rehab facility. We will admit pt to inpt rehab today.  Preadmission Screen Completed By:  Clois Dupes, 04/17/2012 11:23  AM ______________________________________________________________________   Discussed status with Dr. Riley Kill on 04/17/12 at  1129 and received telephone approval for admission today.  Admission Coordinator:  Clois Dupes, time 1610 Date 04/17/12.

## 2012-04-16 NOTE — Progress Notes (Signed)
Speech Language Pathology Dysphagia Treatment Patient Details Name: PANTELIS ELGERSMA MRN: [id number] DOB: 25-Dec-1960 Today's Date: 04/16/2012 Time: 1330-1350 SLP Time Calculation (min): 20 min  Assessment / Plan / Recommendation Clinical Impression  Pt seen for diaphragmatic breathing exercises, and diet tolerance/trials of upgraded textures. Force of volitional cough improving, moderate verbal, tactile and visual cues provided to increase explosive nature of cough to expel penetrate. SLP provided trials of nectar and thin liquids without any overt evidence of aspriation. Though pt at high risk of silent aspriation predict that pt would have had some sensation of significant penetration with thin liquids if function had not improved since last FEES (3/19). Recommend pt continue current diet, but participate in an MBS tomorrow am 930 to evaluate pt ability to swallow liquid consistencies. Prefer MBS to visualize severity of liquids in the trachea and efficiency of cough. Feel pt stable enough to tolerate transfer and upright posture in MBS chair. Will confirm with MD.     Diet Recommendation  Continue with Current Diet: Pudding-thick liquid;Dysphagia 3 (mechanical soft)    SLP Plan Continue with current plan of care   Pertinent Vitals/Pain NA   Swallowing Goals  SLP Swallowing Goals Patient will utilize recommended strategies during swallow to increase swallowing safety with: Maximum assistance;Minimal cueing Swallow Study Goal #2 - Progress: Progressing toward goal Goal #3: Pt will complete base of tongue/pharyngeal/laryngeal strengthening exercises x10 each with moderate verbal cues.  Swallow Study Goal #3 - Progress: Progressing toward goal  General Temperature Spikes Noted: No Respiratory Status: Room air Behavior/Cognition: Alert;Cooperative;Pleasant mood Oral Cavity - Dentition: Adequate natural dentition Patient Positioning: Upright in bed  Oral Cavity - Oral Hygiene Does patient  have any of the following "at risk" factors?: Diet - patient on thickened liquids   Dysphagia Treatment Treatment focused on: Skilled observation of diet tolerance;Upgraded PO texture trials;Utilization of compensatory strategies;Facilitation of pharyngeal phase;Patient/family/caregiver education Family/Caregiver Educated: wife Treatment Methods/Modalities: Effortful swallow Patient observed directly with PO's: Yes Type of PO's observed: Pudding-thick liquids;Nectar-thick liquids;Thin liquids Feeding: Total assist Liquids provided via: Teaspoon;Cup Type of cueing: Verbal;Visual;Tactile Amount of cueing: Moderate   GO    [name], MA CCC-SLP [phone]  [name] 04/16/2012, 2:12 PM

## 2012-04-17 ENCOUNTER — Encounter (HOSPITAL_COMMUNITY): Payer: Self-pay | Admitting: *Deleted

## 2012-04-17 ENCOUNTER — Inpatient Hospital Stay (HOSPITAL_COMMUNITY)
Admission: RE | Admit: 2012-04-17 | Discharge: 2012-04-25 | DRG: 462 | Disposition: A | Discharge status: Other Acute Inpatient Hospital | Payer: BC Managed Care – PPO | Source: Intra-hospital | Attending: Physical Medicine & Rehabilitation | Admitting: Physical Medicine & Rehabilitation

## 2012-04-17 ENCOUNTER — Inpatient Hospital Stay (HOSPITAL_COMMUNITY): Payer: BC Managed Care – PPO

## 2012-04-17 DIAGNOSIS — I480 Paroxysmal atrial fibrillation: Secondary | ICD-10-CM | POA: Diagnosis present

## 2012-04-17 DIAGNOSIS — J189 Pneumonia, unspecified organism: Secondary | ICD-10-CM

## 2012-04-17 DIAGNOSIS — D696 Thrombocytopenia, unspecified: Secondary | ICD-10-CM

## 2012-04-17 DIAGNOSIS — K56699 Other intestinal obstruction unspecified as to partial versus complete obstruction: Secondary | ICD-10-CM

## 2012-04-17 DIAGNOSIS — G7281 Critical illness myopathy: Secondary | ICD-10-CM

## 2012-04-17 DIAGNOSIS — E878 Other disorders of electrolyte and fluid balance, not elsewhere classified: Secondary | ICD-10-CM | POA: Diagnosis present

## 2012-04-17 DIAGNOSIS — N170 Acute kidney failure with tubular necrosis: Secondary | ICD-10-CM | POA: Diagnosis not present

## 2012-04-17 DIAGNOSIS — R52 Pain, unspecified: Secondary | ICD-10-CM | POA: Diagnosis present

## 2012-04-17 DIAGNOSIS — D62 Acute posthemorrhagic anemia: Secondary | ICD-10-CM

## 2012-04-17 DIAGNOSIS — D638 Anemia in other chronic diseases classified elsewhere: Secondary | ICD-10-CM | POA: Diagnosis present

## 2012-04-17 DIAGNOSIS — R29898 Other symptoms and signs involving the musculoskeletal system: Secondary | ICD-10-CM | POA: Diagnosis present

## 2012-04-17 DIAGNOSIS — S36029A Unspecified contusion of spleen, initial encounter: Secondary | ICD-10-CM

## 2012-04-17 DIAGNOSIS — R5381 Other malaise: Secondary | ICD-10-CM | POA: Diagnosis present

## 2012-04-17 DIAGNOSIS — I639 Cerebral infarction, unspecified: Secondary | ICD-10-CM | POA: Diagnosis present

## 2012-04-17 DIAGNOSIS — Z932 Ileostomy status: Secondary | ICD-10-CM

## 2012-04-17 DIAGNOSIS — G8250 Quadriplegia, unspecified: Secondary | ICD-10-CM

## 2012-04-17 DIAGNOSIS — E870 Hyperosmolality and hypernatremia: Secondary | ICD-10-CM | POA: Diagnosis present

## 2012-04-17 DIAGNOSIS — R5383 Other fatigue: Secondary | ICD-10-CM | POA: Diagnosis present

## 2012-04-17 DIAGNOSIS — D649 Anemia, unspecified: Secondary | ICD-10-CM

## 2012-04-17 DIAGNOSIS — G589 Mononeuropathy, unspecified: Secondary | ICD-10-CM | POA: Diagnosis present

## 2012-04-17 DIAGNOSIS — R188 Other ascites: Secondary | ICD-10-CM

## 2012-04-17 DIAGNOSIS — J9600 Acute respiratory failure, unspecified whether with hypoxia or hypercapnia: Secondary | ICD-10-CM

## 2012-04-17 DIAGNOSIS — J690 Pneumonitis due to inhalation of food and vomit: Secondary | ICD-10-CM | POA: Diagnosis not present

## 2012-04-17 DIAGNOSIS — G9340 Encephalopathy, unspecified: Secondary | ICD-10-CM | POA: Diagnosis not present

## 2012-04-17 DIAGNOSIS — N179 Acute kidney failure, unspecified: Secondary | ICD-10-CM

## 2012-04-17 DIAGNOSIS — IMO0002 Reserved for concepts with insufficient information to code with codable children: Secondary | ICD-10-CM

## 2012-04-17 DIAGNOSIS — B370 Candidal stomatitis: Secondary | ICD-10-CM | POA: Diagnosis present

## 2012-04-17 DIAGNOSIS — J90 Pleural effusion, not elsewhere classified: Secondary | ICD-10-CM

## 2012-04-17 DIAGNOSIS — I4891 Unspecified atrial fibrillation: Secondary | ICD-10-CM | POA: Diagnosis present

## 2012-04-17 DIAGNOSIS — I6340 Cerebral infarction due to embolism of unspecified cerebral artery: Secondary | ICD-10-CM

## 2012-04-17 DIAGNOSIS — R142 Eructation: Secondary | ICD-10-CM | POA: Diagnosis present

## 2012-04-17 DIAGNOSIS — R1310 Dysphagia, unspecified: Secondary | ICD-10-CM | POA: Diagnosis present

## 2012-04-17 DIAGNOSIS — Y95 Nosocomial condition: Secondary | ICD-10-CM

## 2012-04-17 DIAGNOSIS — Z9049 Acquired absence of other specified parts of digestive tract: Secondary | ICD-10-CM

## 2012-04-17 DIAGNOSIS — R141 Gas pain: Secondary | ICD-10-CM | POA: Diagnosis present

## 2012-04-17 DIAGNOSIS — E43 Unspecified severe protein-calorie malnutrition: Secondary | ICD-10-CM

## 2012-04-17 DIAGNOSIS — I69919 Unspecified symptoms and signs involving cognitive functions following unspecified cerebrovascular disease: Secondary | ICD-10-CM

## 2012-04-17 DIAGNOSIS — Z5189 Encounter for other specified aftercare: Principal | ICD-10-CM

## 2012-04-17 MED ORDER — FLUCONAZOLE 100 MG PO TABS
100.0000 mg | ORAL_TABLET | Freq: Every day | ORAL | Status: DC
  Administered 2012-04-18 – 2012-04-20 (×3): 100 mg via ORAL
  Filled 2012-04-17 (×4): qty 1

## 2012-04-17 MED ORDER — DIPHENHYDRAMINE HCL 12.5 MG/5ML PO ELIX: 12.5000 mg | ORAL_SOLUTION | Freq: Four times a day (QID) | ORAL | Status: DC | PRN

## 2012-04-17 MED ORDER — ONDANSETRON 4 MG PO TBDP
4.0000 mg | ORAL_TABLET | Freq: Three times a day (TID) | ORAL | Status: DC | PRN
  Administered 2012-04-17: 4 mg via ORAL
  Filled 2012-04-17: qty 1

## 2012-04-17 MED ORDER — CHLORHEXIDINE GLUCONATE 0.12 % MT SOLN
15.0000 mL | Freq: Two times a day (BID) | OROMUCOSAL | Status: DC
  Administered 2012-04-17 – 2012-04-24 (×15): 15 mL via OROMUCOSAL
  Filled 2012-04-17 (×19): qty 15

## 2012-04-17 MED ORDER — PANTOPRAZOLE SODIUM 40 MG PO TBEC
40.0000 mg | DELAYED_RELEASE_TABLET | Freq: Two times a day (BID) | ORAL | Status: DC
  Administered 2012-04-17 – 2012-04-19 (×4): 40 mg via ORAL
  Filled 2012-04-17 (×3): qty 1

## 2012-04-17 MED ORDER — DEXTROSE 5 % IV SOLN: INTRAVENOUS | Status: DC

## 2012-04-17 MED ORDER — GUAIFENESIN-DM 100-10 MG/5ML PO SYRP: 5.0000 mL | ORAL_SOLUTION | Freq: Four times a day (QID) | ORAL | Status: DC | PRN

## 2012-04-17 MED ORDER — FENTANYL CITRATE 0.05 MG/ML IJ SOLN: 25.0000 ug | INTRAMUSCULAR | Status: DC | PRN

## 2012-04-17 MED ORDER — ONDANSETRON 4 MG PO TBDP
4.0000 mg | ORAL_TABLET | Freq: Three times a day (TID) | ORAL | Status: DC | PRN
  Administered 2012-04-18 (×2): 4 mg via ORAL
  Filled 2012-04-17 (×3): qty 1

## 2012-04-17 MED ORDER — TRAZODONE HCL 50 MG PO TABS: 25.0000 mg | ORAL_TABLET | Freq: Every evening | ORAL | Status: DC | PRN

## 2012-04-17 MED ORDER — SACCHAROMYCES BOULARDII 250 MG PO CAPS
250.0000 mg | ORAL_CAPSULE | Freq: Two times a day (BID) | ORAL | Status: DC
  Administered 2012-04-17 – 2012-04-18 (×3): 250 mg via ORAL
  Filled 2012-04-17 (×7): qty 1

## 2012-04-17 MED ORDER — DEXTROSE-NACL 5-0.45 % IV SOLN
INTRAVENOUS | Status: AC
  Administered 2012-04-17: 18:00:00 via INTRAVENOUS

## 2012-04-17 MED ORDER — ALUM & MAG HYDROXIDE-SIMETH 200-200-20 MG/5ML PO SUSP
15.0000 mL | ORAL | Status: DC | PRN
  Administered 2012-04-17 – 2012-04-21 (×8): 15 mL via ORAL
  Filled 2012-04-17 (×5): qty 30
  Filled 2012-04-17: qty 60
  Filled 2012-04-17: qty 30

## 2012-04-17 MED ORDER — METHADONE HCL 5 MG PO TABS
5.0000 mg | ORAL_TABLET | Freq: Three times a day (TID) | ORAL | Status: DC
  Administered 2012-04-17 – 2012-04-18 (×2): 5 mg
  Filled 2012-04-17 (×2): qty 1

## 2012-04-17 MED ORDER — ONDANSETRON HCL 4 MG PO TABS: 4.0000 mg | ORAL_TABLET | Freq: Three times a day (TID) | ORAL | Status: DC | PRN

## 2012-04-17 MED ORDER — FLUCONAZOLE 200 MG PO TABS
200.0000 mg | ORAL_TABLET | Freq: Once | ORAL | Status: AC
  Administered 2012-04-17: 200 mg via ORAL
  Filled 2012-04-17: qty 1

## 2012-04-17 MED ORDER — RESOURCE THICKENUP CLEAR PO POWD
ORAL | Status: DC | PRN
  Filled 2012-04-17: qty 125

## 2012-04-17 MED ORDER — FLUCONAZOLE 100 MG PO TABS: 100.0000 mg | ORAL_TABLET | Freq: Every day | ORAL | Status: DC

## 2012-04-17 MED ORDER — LIDOCAINE 5 % EX PTCH
1.0000 | MEDICATED_PATCH | CUTANEOUS | Status: DC
  Administered 2012-04-18 – 2012-04-25 (×8): 1 via TRANSDERMAL
  Filled 2012-04-17 (×8): qty 1

## 2012-04-17 NOTE — H&P (Signed)
Physical Medicine and Rehabilitation Admission H&P  Chief Complaint   Patient presents with   .    :  HPI: [name] is a 52 y.o. right-handed male with history of ulcerative colitis as well as prior diverticular perforation, who was admitted to [facility] on 02/26/2012 with low back pain, fever 102.5 and multiple episodes of watery diarrhea. Underwent flexible sigmoidoscopy 02/27/2012 per Dr.Rehman with findings of multiple pseudomembranes consistent with C. difficile colitis as well as low colorectal stricture. Underwent exploratory laparotomy,cecostomy tube placement per Dr. Bernette Mayers 02/27/2012. 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 Post op he developed ARDS with septic shock as well as renal failure due to AKI. On 02/06 he developed new onset Atrial fibrillation with RVR and was transferred to [facility] for treatment. Dr. Blanche East consulted for input on metabolic acidosis with anasarca and non-oliguric ARF likely due to ischemic ATN in setting of hypotension and CRRT initiated. He was started on stress dose steroids and was treated with broad spectrum antibiotics. Dr. Dalbert Batman consulted for input and with close monitoring of wound. Wound evisceration treated with placement of retention sutures and wound closure on 3/9 by Dr. Excell Seltzer.  He developed thrombocytopenia with question of HIT was changed to argatroban  but this was discontinued due to bleeding from drain. Neurology consulted for input global weakness and proximal RUE weakness. 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 Ct cervical spine with degenerative changes and large left pleural effusion. Dr. Tammi Klippel consulted for possible ureteral injury due to high output from JP drain and cysto with pyelogram revealed no filling defects or  extravasation. Dr. Amedeo Plenty consulted for input on abnormal LFTs. 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 Dr. Leonie Man recommended ASA for now and anticoagulation once thrombocytopenia/LFTs resolve and patient able to swallow safely. Dr Lorin Mercy consulted due to right shoulder pain and inferior subluxation on xray. He felt inferior position due to RUE weakness and not dislocation He developed hypotension with GIB requiring multiple units PRBC on 3/10. He continues with high output from ileostomy with hypernatremia. Panda discontinued on 03/24 And follow up MBS done today due to dysphagia. Diet advanced to D3, thin liquids by tsp. He has had oral discomfort and was started on diflucan for thrush. Pain control remains an issue was placed on methadone.  IV narcotics were discontinued yesterday. He's severely deconditioned and working on truncal and head control in sitting at EOB. Noted to have tachycardia with activity. therapy team recommended CIR and he admitted today for further therapies.    Review of Systems  Unable to perform ROS: other  Constitutional: Positive for malaise/fatigue.  Cardiovascular: Positive for palpitations.  Gastrointestinal: Positive for abdominal pain.  Musculoskeletal: Positive for myalgias.  Neurological: Positive for focal weakness.  Psychiatric/Behavioral: The patient is nervous/anxious.   Past Medical History   Diagnosis  Date   .  Chronic diarrhea    .  Rectal bleed    .  Hemorrhoids    .  Ulcerative colitis      Distal UC over 8 yrs ago diagnosed   .  Diverticulitis of large intestine with perforation  10/2011     done at  [place]    Past Surgical History   Procedure  Laterality  Date   .  Colonoscopy   9/03   .  Sigmoidoscopy       06/13/2002   .   Temporary ostomy November 06, 2010       for a colon perforation that was done in [facility] (Dr Payton Doughty).   .  Colonoscopy   02/16/2011     Procedure: COLONOSCOPY; Surgeon: Rogene Houston, MD; [facility]: [facility]; Service: Endoscopy; Laterality: N/A; 100   .  Flexible sigmoidoscopy   02/27/2012     Procedure: FLEXIBLE SIGMOIDOSCOPY; Surgeon: Rogene Houston, MD; [facility]: [facility]; Service: Endoscopy; Laterality: N/A; with colonic decompression   .  Laparotomy   02/27/2012     Procedure: EXPLORATORY LAPAROTOMY; Surgeon: Donato Heinz, MD; Location: [facility]; Service: General; Laterality: N/A;   .  Cecostomy   02/27/2012     Procedure: CECOSTOMY; Surgeon: Donato Heinz, MD; Location: [facility]; Service: General; Laterality: N/A; Cecostomy Tube Placement   .  Colectomy   02/28/2012     Procedure: TOTAL COLECTOMY; Surgeon: Donato Heinz, MD; Location: [facility]; Service: General; Laterality: N/A;   .  Ileostomy   02/28/2012     Procedure: ILEOSTOMY; Surgeon: Donato Heinz, MD; Location: [facility]; Service: General; Laterality: N/A;   .  Cystoscopy w/ ureteral stent placement  Bilateral  03/20/2012     Procedure: CYSTOSCOPY WITH RETROGRADE PYELOGRAM/URETERAL STENT PLACEMENT; Surgeon: Alexis Frock, MD; Location: [place]; Service: Urology; Laterality: Bilateral;   .  Laparotomy  N/A  03/26/2012     Procedure: EXPLORATORY LAPAROTOMY; Surgeon: Edward Jolly, MD; Location: [place]; Service: General; Laterality: N/A;   .  Application of wound vac  N/A  03/26/2012     Procedure: APPLICATION OF WOUND VAC; Surgeon: Edward Jolly, MD; Location: [facility]; Service: General; Laterality: N/A;   .  Liver biopsy  N/A  03/26/2012     Procedure: LIVER BIOPSY; Surgeon: Edward Jolly, MD; Location: [place]; Service: General; Laterality: N/A;   .  Laparotomy  N/A  03/29/2012     Procedure: EXPLORATORY LAPAROTOMY, PARTIAL WOUND CLOSURE; Surgeon: Edward Jolly, MD; Location: [place]; Service: General; Laterality:  N/A;   .  Vacuum assisted closure change  N/A  03/29/2012     Procedure: Open ABDOMINAL VACUUM CHANGE; Surgeon: Edward Jolly, MD; Location: [place]; Service: General; Laterality: N/A;   .  Vacuum assisted closure change  N/A  04/01/2012     Procedure: removal of abdominal vac dressing and abdominal closure; Surgeon: Edward Jolly, MD; [facility]: [facility]; Service: General; Laterality: N/A;    Family History   Problem  Relation  Age of Onset   .  Cancer  Sister    .  Healthy  Daughter    .  Hypertension  Mother     Social History: Married. Works as a Furniture conservator/restorer for [employer] He reports that he quit smoking about 27 years ago. His smoking use included Cigarettes. He smoked 0.00 packs per day. He has never used smokeless tobacco. He reports that he does not drink alcohol or use illicit drugs.\  Allergies   Allergen  Reactions   .  Penicillins  Other (See Comments)     Heart rate changes   .  Imipenem  Rash    Medications Prior to Admission   Medication  Sig  Dispense  Refill   .  bifidobacterium infantis (  ALIGN) capsule  Take 1 capsule by mouth daily.  14 capsule  0   .  FORTESTA 10 MG/ACT (2%) GEL  at bedtime. Patient states that he applies it to his leg     .  ibuprofen (ADVIL,MOTRIN) 200 MG tablet  Take 400 mg by mouth 2 (two) times daily as needed. For pain     .  Mesalamine (ASACOL HD) 800 MG TBEC  Take 2 tablets (1,600 mg total) by mouth 2 (two) times daily.  180 tablet  3   .  Pseudoeph-Doxylamine-DM-APAP (NYQUIL D COLD/FLU) 60-12.06-22-998 MG/30ML LIQD  Take 30 mLs by mouth at bedtime. Cold/flu symptoms     .  Pseudoephedrine-APAP-DM (DAYQUIL MULTI-SYMPTOM COLD/FLU PO)  Take 1 capsule by mouth every 4 (four) hours as needed. Cold symptoms      Home:  Home Living  Lives With: Spouse  Available Help at Discharge: (wife on FMLA as school Pharmacist, hospital. Does not plan to return '14)  Type of Home: House  Home Access: Stairs to enter  [employer] of Steps: 1 step onto porch and  one step into house  Entrance Stairs-Rails: None  Home Layout: One level (with basement)  Bathroom Shower/Tub: Tourist information centre manager: Standard  Bathroom Accessibility: Yes  How Accessible: Accessible via walker  Additional Comments: wife fmla, family close by and will help; his brother  Functional History:  Prior Function  Able to Take Stairs?: Yes  Driving: Yes  Vocation: Full time employment  Functional Status:  Mobility:  Bed Mobility  Bed Mobility: Rolling Right;Rolling Left;Left Sidelying to Sit;Sitting - Scoot to [employer] of Bed;Sit to Supine;Scooting to [facility]  Rolling Right: 1: +2 Total assist;With rail  Rolling Right: Patient Percentage: 20%  Rolling Left: 1: +2 Total assist  Rolling Left: Patient Percentage: 10%  Left Sidelying to Sit: 1: +2 Total assist  Left Sidelying to Sit: Patient Percentage: 10%  Supine to Sit: 1: +1 Total assist  Sitting - Scoot to Edge of Bed: 1: +1 Total assist  Sitting - Scoot to Edge of Bed: Patient Percentage: 0%  Sit to Supine: 1: +2 Total assist  Sit to Supine: Patient Percentage: 10%  Scooting to HOB: 1: +2 Total assist  Scooting to [facility]: Patient Percentage: 0%  Transfers  Transfers: Lateral/Scoot Transfers  Sit to Stand: 2: Max assist  Sit to Stand: Patient Percentage: 10%  Stand to Sit: 1: +2 Total assist;To bed  Stand to Sit: Patient Percentage: 10%  Squat Pivot Transfers: 1: +2 Total assist  Squat Pivot Transfers: Patient Percentage: 20%  Lateral/Scoot Transfers: 1: +2 Total assist  Lateral Transfers: Patient Percentage: 10%  Ambulation/Gait  Ambulation/Gait Assistance: Not tested (comment)  Stairs: No  Wheelchair Mobility  Wheelchair Mobility: No  ADL:  ADL  Eating/Feeding: Maximal assistance (hand over hand using L hand)  Where Assessed - Eating/Feeding: Chair  Grooming: Maximal assistance  Where Assessed - Grooming: Supported sitting (combing hair with L hand. Hand over hand)  Upper Body Bathing: +1 Total assistance   Lower Body Bathing: +1 Total assistance  Upper Body Dressing: +1 Total assistance  Lower Body Dressing: +1 Total assistance  Toilet Transfer: +2 Total assistance;Simulated  Transfers/Ambulation Related to ADLs: +2 total A. Using NDT over the back technique  ADL Comments: Family not available for education on ROM this session.  Cognition:  Cognition  Arousal/Alertness: Awake/alert  Orientation Level: Oriented X4  Cognition  Overall Cognitive Status: Impaired  Area of Impairment: Attention;Problem solving;Memory  Difficult to assess due to:  Impaired communication  Arousal/Alertness: Awake/alert  Orientation Level: Disoriented to;Time;Place  Behavior During Session: Anxious  Current Attention Level: Selective  Attention - Other Comments: difficulty attending to session when needs to clear secretions.  Memory Deficits: difficulty recalling previous sessions and how he has progressed; does recall being dropped and still fearful  Following Commands: Follows one step commands consistently  Problem Solving: Mod A  Cognition - Other Comments: Worked with pt in low stimulus setting.    Physical Exam:  Blood pressure 112/72, pulse 105, temperature 98 F (36.7 C), temperature source Oral, resp. rate 24, height 5' 9"  (1.753 m), weight 76.6 kg (168 lb 14 oz), SpO2 97.00%.  Constitutional: He appears well-developed. He has a sickly appearance.  Pale and encephalopathic appearing. Anxious,  needy-multiple demands of wife during the exam.  HENT:  Head: Normocephalic and atraumatic.  Eyes: Pupils are equal, round, and reactive to light.  Neck: Normal range of motion.  Cardiovascular: Regular rhythm. Tachycardia present.  HR 120s during the exam.  Pulmonary/Chest: Effort normal. No respiratory distress. He has decreased breath sounds in the right lower field and the left lower field. He has no wheezes.  Abdominal: He exhibits distension. Bowel sounds are increased. There is tenderness.  Midline  incision with retention sutures in place--dehisced incision with pink, clean wound bed. No odor and minimal drainage on dressing.  Neurological: He is alert.  Distracted but redirectable.  Able to find calendar to give me date. Basic insight and awareness. Was able to follow basic commands. Hoarse voice. 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 RUE grossly trace to 1/5. LUE is 1+ to 2/5. Both lower extremities grossly 1 to1+ proximally with right almost a half a grade weaker than left. ADF is trace to 0/5. APF is 1 to 1+ bilaterally. Able to wiggle toes bilaterally. Sensation grossly intact to pain stimulation in all 4. May be decreased to LT in distal LE's but difficult to assess given his attentional issues. DTR's 1+.  Skin: Skin is warm and dry.  Psychiatric: His mood appears anxious.  Easily agitated.   Results for orders placed during the hospital encounter of 02/26/12 (from the past 48 hour(s))   CBC WITH DIFFERENTIAL Status: Abnormal    Collection Time    04/16/12 5:49 AM   Result  Value  Range    WBC  8.8  4.0 - 10.5 K/uL    RBC  2.42 (*)  4.22 - 5.81 MIL/uL    Hemoglobin  7.6 (*)  13.0 - 17.0 g/dL    HCT  24.7 (*)  39.0 - 52.0 %    MCV  102.1 (*)  78.0 - 100.0 fL    MCH  31.4  26.0 - 34.0 pg    MCHC  30.8  30.0 - 36.0 g/dL    RDW  19.4 (*)  11.5 - 15.5 %    Platelets  212  150 - 400 K/uL    Neutrophils Relative  86 (*)  43 - 77 %    Neutro Abs  7.5  1.7 - 7.7 K/uL    Lymphocytes Relative  9 (*)  12 - 46 %    Lymphs Abs  0.8  0.7 - 4.0 K/uL    Monocytes Relative  4  3 - 12 %    Monocytes Absolute  0.4  0.1 - 1.0 K/uL    Eosinophils Relative  1  0 - 5 %    Eosinophils Absolute  0.1  0.0 - 0.7 K/uL  Basophils Relative  1  0 - 1 %    Basophils Absolute  0.0  0.0 - 0.1 K/uL   COMPREHENSIVE METABOLIC PANEL Status: Abnormal    Collection Time    04/16/12 5:49 AM   Result  Value  Range    Sodium  155 (*)  135 - 145 mEq/L    Potassium  3.5  3.5 - 5.1 mEq/L    Chloride  124 (*)   96 - 112 mEq/L    CO2  22  19 - 32 mEq/L    Glucose, Bld  124 (*)  70 - 99 mg/dL    BUN  45 (*)  6 - 23 mg/dL    Creatinine, Ser  1.25  0.50 - 1.35 mg/dL    Calcium  8.5  8.4 - 10.5 mg/dL    Total Protein  5.0 (*)  6.0 - 8.3 g/dL    Albumin  1.5 (*)  3.5 - 5.2 g/dL    AST  31  0 - 37 U/L    ALT  47  0 - 53 U/L    Alkaline Phosphatase  378 (*)  39 - 117 U/L    Total Bilirubin  1.8 (*)  0.3 - 1.2 mg/dL    GFR calc non Af Amer  64 (*)  >90 mL/min    GFR calc Af Amer  74 (*)  >90 mL/min    Comment:      The eGFR has been calculated     using the CKD EPI equation.     This calculation has not been     validated in all clinical     situations.     eGFR's persistently     <90 mL/min signify     possible Chronic Kidney Disease.    Dg Chest Port 1 View  04/16/2012 *RADIOLOGY REPORT* Clinical Data: Hypoventilation. PORTABLE CHEST - 1 VIEW Comparison: 04/09/2012 Findings: 0523 hours. Lung volumes are low. Left base collapse / consolidation with effusion again noted. NG tube has been removed in the interval and the tip of the feeding tube now overlies the mid stomach. Left IJ central line tip projects over the proximal SVC region. Telemetry leads overlie the chest. IMPRESSION: Interval removal of NG tube with placement of feeding tube with tip projecting over the mid stomach. Stable cardiopulmonary exam. Original Report Authenticated By: Misty Stanley, M.D.    Post Admission Physician Evaluation:  1. Functional deficits secondary to embolic, bicerebral infarcts. Additionally with severe deconditioning and associated critical illness neuropathy. 2. Patient is admitted to receive collaborative, interdisciplinary care between the physiatrist, rehab nursing staff, and therapy team. 3. Patient's level of medical complexity and substantial therapy needs in context of that medical necessity cannot be provided at a lesser intensity of care such as a SNF. 4. Patient has experienced substantial functional  loss from his/her baseline which was documented above under the "Functional History" and "Functional Status" headings. Judging by the patient's diagnosis, physical exam, and functional history, the patient has potential for functional progress which will result in measurable gains while on inpatient rehab. These gains will be of substantial and practical use upon discharge in facilitating mobility and self-care at the household level. 5. Physiatrist will provide 24 hour management of medical needs as well as oversight of the therapy plan/treatment and provide guidance as appropriate regarding the interaction of the two. 6. 24 hour rehab nursing will assist with bladder management, bowel management, safety, skin/wound care, disease management, medication administration, pain  management and patient education and help integrate therapy concepts, techniques,education, etc. PT will assess and treat for/with: Lower extremity strength, range of motion, stamina, balance, functional mobility, safety, adaptive techniques and equipment, NMR, pain mgt, education. Goals are: moderate assist. 7. OT will assess and treat for/with: ADL's, functional mobility, safety, upper extremity strength, adaptive techniques and equipment, NMR, pain mgt, education. Goals are: moderate assist. 8. SLP will assess and treat for/with: cognition, communication, swallowing. Goals are: supervision to minimal assist. 9. Case Management and Social Worker will assess and treat for psychological issues and discharge planning. 10. Team conference will be held weekly to assess progress toward goals and to determine barriers to discharge. 11. Patient will receive at least 3 hours of therapy per day at least 5 days per week. 12. ELOS: 4 weeks Prognosis: good Medical Problem List and Plan:  1. DVT Prophylaxis/Anticoagulation: Mechanical: Sequential compression devices, below knee Bilateral lower extremities thrombocytopenia resolved. 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 GIB 03/10.   -dopplers performed 3/7 were negative 2. Multifactorial pain: Panda now out so should help decrease reflux/panda discomfort. Abdominal distention/ discomfort continues.  3. Mood: Distracted due to multiple medical issues. Underlying cognitive issues also clouding the picture. Will need a lot of ego support by the team. Neuropsychologist and LCSW to follow additionally for evaluation and support once patient gets into a routine.  4. Neuropsych: This patient is not capable of making decisions on his/her own behalf.  5. Hypernatremia: due to high volume losses. Will add HS hydration as doubt he'll be able to maintain adequate hydration on liquids by tsp. Change IVF to D5W.  6. Bilateral cardioembolic CVA: no blood thinners due to issues with bleeding?  7. ABLA: will continue to monitor H/H and transfuse for hgb <7.0. Currently ranging from 7.6- 8.4 range. Will check anemia panel. MCV 102. May benefit from aranesp.  8. Critical illness neuropathy-  Protect heels, stretch heel cords  -AFO's at some point when mobility justifies them   Meredith Staggers, MD, Mellody Drown  04/17/2012

## 2012-04-17 NOTE — Plan of Care (Signed)
Overall Plan of Care [facility]) Patient Details Name: Connor Small MRN: [id number] DOB: 03/01/1960  Diagnosis:  Critical Illness Myopathy/Neuropathy  Co-morbidities: Toxic megacolon s/p total colectomy, Pneumonia, Ileus  Functional Problem List  Patient demonstrates impairments in the following areas: Balance, Bladder, Bowel, Cognition, Endurance, Medication Management, Motor, Nutrition, Pain, Perception, Safety, Sensory  and Skin Integrity  Basic ADL's: eating, grooming, bathing, dressing and toileting Advanced ADL's: n/a at this time  Transfers:  bed mobility, bed to chair, toilet and car tub/shower TBA Locomotion:  ambulation, wheelchair mobility and stairs  Additional Impairments:  Functional use of bilateral upper extremities, Swallowing, Communication  expression and Social Cognition   problem solving, memory, attention and awareness  Anticipated Outcomes Item Anticipated Outcome  Eating/Swallowing  Supervision-min assist  Basic self-care  Supervision-Mod assist  Tolieting  Mod Assist  Bowel/Bladder  Remain continent of bowel/bladder with min assist  Transfers  Min A-Mod A   Locomotion  Supervision w/c mobility, min A gait  Communication  Supervision   Cognition  Min assist   Pain  <=3 on scale 0-10  Safety/Judgment  Supervision   Other     Therapy Plan: PT Intensity: Minimum of 1-2 x/day ,45 to 90 minutes PT Frequency: Total of 15 hours over 7 days Frequency due to: complicated medical course, pain, extremely deconditioned PT Duration Estimated Length of Stay: 6 weeks OT Intensity: Minimum of 1-2 x/day, 45 to 90 minutes OT Frequency: Total of 15 hours over 7 days Frequency due to: extremely deconditioned and pain OT Duration/Estimated Length of Stay: 5 weeks SLP Intensity: Minumum of 1-2 x/day, 30 to 90 minutes SLP Frequency: 5 out of 7 days SLP Duration/Estimated Length of Stay: 6 weeks    Team Interventions: Item RN PT OT SLP SW TR Other  Self  Care/Advanced ADL Retraining   x      Neuromuscular Re-Education  x x      Therapeutic Activities  x x x     UE/LE Strength Training/ROM  x x      UE/LE Coordination Activities  x x      Visual/Perceptual Remediation/Compensation         DME/Adaptive Equipment Instruction  x x      Therapeutic Exercise  x x x     Balance/Vestibular Training  x x      Patient/Family Education x x x x     Cognitive Remediation/Compensation  x x x     Functional Mobility Training  x x      Ambulation/Gait Training  x       Stair Training  x       Wheelchair Propulsion/Positioning  x x      Functional Statistician  x       Community Reintegration  x x      Dysphagia/Aspiration Printmaker    x     Speech/Language Facilitation    x     Bladder Management x        Bowel Management x        Disease Management/Prevention  x x      Pain Management x x x      Medication Management x        Skin Care/Wound Management x x x      Splinting/Orthotics  x x      Discharge Planning  x x x     Psychosocial Support  x x x  Team Discharge Planning: Destination: PT-Home ,OT- Home , SLP-Home Projected Follow-up: PT-Home health PT;24 hour supervision/assistance, OT-  Home health OT, SLP-Home Health SLP;24 hour supervision/assistance Projected Equipment Needs: PT-Rolling walker with 5" wheels;Wheelchair (measurements);Wheelchair cushion (measurements), OT- TBA, SLP-None recommended by SLP Patient/family involved in discharge planning: PT- Patient;Family member/caregiver,  OT-Patient;Family member/caregiver, SLP-Patient  MD ELOS: 4-5 weeks Medical Rehab Prognosis:  Fair Assessment: 52 yo male admitted to rehab with 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  Team will focus on WC level mobility goals , increase po intake of fluid and calories, wound healing, activity tolerance    See Team Conference Notes for weekly updates  to the plan of care

## 2012-04-17 NOTE — Procedures (Addendum)
Objective Swallowing Evaluation: Modified Barium Swallowing Study  Patient Details  Name: Connor Small MRN: [id number] Date of Birth: 1960-03-21  Today's Date: 04/17/2012 Time: 0940-1010 SLP Time Calculation (min): 30 min  Past Medical History:  Past Medical History  Diagnosis Date  . Chronic diarrhea   . Rectal bleed   . Hemorrhoids   . Ulcerative colitis     Distal UC over 8 yrs ago diagnosed  . Diverticulitis of large intestine with perforation 10/2011    done at [facility]   Past Surgical History:  Past Surgical History  Procedure Laterality Date  . Colonoscopy  9/03  . Sigmoidoscopy       06/13/2002  . Temporary ostomy November 06, 2010      for a colon perforation that was done in [facility] (Dr Ruben Im).    . Colonoscopy  02/16/2011    Procedure: COLONOSCOPY;  Surgeon: Malissa Hippo, MD;  Location: [facility];  Service: Endoscopy;  Laterality: N/A;  100  . Flexible sigmoidoscopy  02/27/2012    Procedure: FLEXIBLE SIGMOIDOSCOPY;  Surgeon: Malissa Hippo, MD;  Location: [facility];  Service: Endoscopy;  Laterality: N/A;  with colonic decompression  . Laparotomy  02/27/2012    Procedure: EXPLORATORY LAPAROTOMY;  Surgeon: Fabio Bering, MD;  Location: [facility] OR[facility];  Service: General;  Laterality: N/A;  . Cecostomy  02/27/2012    Procedure: CECOSTOMY;  Surgeon: Fabio Bering, MD;  Location: [facility];  Service: General;  Laterality: N/A;  Cecostomy Tube Placement  . Colectomy  02/28/2012    Procedure: TOTAL COLECTOMY;  Surgeon: Fabio Bering, MD;  Location: [facility];  Service: General;  Laterality: N/A;  . Ileostomy  02/28/2012    Procedure: ILEOSTOMY;  Surgeon: Fabio Bering, MD;  Location: [facility];  Service: General;  Laterality: N/A;  . Cystoscopy w/ ureteral stent placement Bilateral 03/20/2012    Procedure: CYSTOSCOPY WITH RETROGRADE PYELOGRAM/URETERAL STENT PLACEMENT;  Surgeon: Sebastian Ache, MD;  Location: [facility] OR;  Service: Urology;  Laterality: Bilateral;  . Laparotomy N/A  03/26/2012    Procedure: EXPLORATORY LAPAROTOMY;  Surgeon: Mariella Saa, MD;  Location: [facility] OR;  Service: General;  Laterality: N/A;  . Application of wound vac N/A 03/26/2012    Procedure: APPLICATION OF WOUND VAC;  Surgeon: Mariella Saa, MD;  Location: [facility] OR;  Service: General;  Laterality: N/A;  . Liver biopsy N/A 03/26/2012    Procedure: LIVER BIOPSY;  Surgeon: Mariella Saa, MD;  Location: [facility] OR;  Service: General;  Laterality: N/A;  . Laparotomy N/A 03/29/2012    Procedure: EXPLORATORY LAPAROTOMY, PARTIAL WOUND CLOSURE;  Surgeon: Mariella Saa, MD;  Location: [facility] OR;  Service: General;  Laterality: N/A;  . Vacuum assisted closure change N/A 03/29/2012    Procedure: Open ABDOMINAL VACUUM  CHANGE;  Surgeon: Mariella Saa, MD;  Location: [facility] OR;  Service: General;  Laterality: N/A;  . Vacuum assisted closure change N/A 04/01/2012    Procedure: removal of abdominal vac dressing and abdominal closure;  Surgeon: Mariella Saa, MD;  Location: [facility] OR;  Service: General;  Laterality: N/A;   HPI:  2 YOM admitted to [facility] for Cdiff on 2/2. Underwent decompressive cecostomy 2/3, initially was better, then worsened on 2/4 with increased abdominal distension and tenderness, decreased renal function.  He was taken back to the OR on 2/4 for total abdominal colectomy and end ileostomy.  Post op he developed ARDS and sepsis shock and was being treated for  this.  The morning of 2/6 he had new onset of A-fib and transferred to [facility].  Initial FEES showed severe, dry pharyngeal secretions. after 2-3 days of frequent oral care and ice chips, repeat FEES showed improved pharyngeal hygiene, DI/pudding stated with continue TF. Over the following week pt underent two more surgeries for abdominal wound with brief intubation x2 and then developed GI bleed.  Pt has had four FEES studies, with the last recommending  a dys 3 (mechanical soft) diet and pudding thick liquids. Today pt with have his  first MBS to determine if he is ready to upgrade to liquid textures. He had NG tube removed yesterday and is relying on POs for 100% of nutritional needs.      Assessment / Plan / Recommendation Clinical Impression  Dysphagia Diagnosis: Mild cervical esophageal phase dysphagia;Moderate pharyngeal phase dysphagia Clinical impression: Pt likely with similar function seen in last FEES study though view from MBS allowed better visualization of quantity of penetrate/aspirate. Pt continues to demonstrate generalized weakness/deconditioning that results in reduced pharyngeal strength and decreased airway protection. With nectar and honey thick liquids there is trace penetration during the swallow that builds to frank penetration and sometimes trace silent aspiraiton as moderate pharyngeal residuals fall into the vestibule post swallow. A cued throat clear and second swallow helps to remove most aspirate/penetrate though if residuals are still significant, pt will repenetrate. A chin tuck worsened severity of penetration. Thin liquids also result in trace penetration during the swallow due to reduced laryngeal closure, but residuals are minimal, making thin liquids the best texture for the pt at this time. Risk of aspiration is still moderately high and consumption will need to be very conservative. Recommend teaspoon bites with cues for an immediate throat clear and second swallow. Continue Mechanical soft diet not with thin liquids.     Treatment Recommendation  Therapy as outlined in treatment plan below    Diet Recommendation Dysphagia 3 (Mechanical Soft);Thin liquid   Liquid Administration via: Spoon Medication Administration: Whole meds with puree Supervision: Staff feed patient;Trained caregiver to feed patient Compensations: Slow rate;Small sips/bites;Multiple dry swallows after each bite/sip;Follow solids with liquid;Clear throat intermittently;Effortful swallow Postural Changes and/or Swallow  Maneuvers: Seated upright 90 degrees;Upright 30-60 min after meal    Other  Recommendations Oral Care Recommendations: Oral care BID Other Recommendations: Have oral suction available   Follow Up Recommendations  Inpatient Rehab    Frequency and Duration min 4x/week  2 weeks   Pertinent Vitals/Pain NA    SLP Swallow Goals Patient will utilize recommended strategies during swallow to increase swallowing safety with: Minimal cueing   General HPI: 73 YOM admitted to [facility] for Cdiff on 2/2. Underwent decompressive cecostomy 2/3, initially was better, then worsened on 2/4 with increased abdominal distension and tenderness, decreased renal function.  He was taken back to the OR on 2/4 for total abdominal colectomy and end ileostomy.  Post op he developed ARDS and sepsis shock and was being treated for this.  The morning of 2/6 he had new onset of A-fib and transferred to [facility].  Initial FEES showed severe, dry pharyngeal secretions. after 2-3 days of frequent oral care and ice chips, repeat FEES showed improved pharyngeal hygiene, DI/pudding stated with continue TF. Over the following week pt underent two more surgeries for abdominal wound with brief intubation x2 and then developed GI bleed. Today  Pt has had four FEES studies, with the last recommending  a dys 3 (mechanical soft) diet and  pudding thick liquids. Today pt with have his first MBS to determine if he is ready to upgrade to liquid textures. He had NG tube removed yesterday and is relying on POs for 100% of nutritional needs.  Type of Study: Modified Barium Swallowing Study Reason for Referral: Objectively evaluate swallowing function Previous Swallow Assessment: FEES 03/20/12, 2/28, 3/12, 3/19 Diet Prior to this Study: Dysphagia 3 (soft);Pudding-thick liquids Temperature Spikes Noted: No Respiratory Status: Room air History of Recent Intubation: Yes Length of Intubations (days): 16 days Date extubated:  03/16/12 Behavior/Cognition: Alert;Cooperative;Pleasant mood Oral Cavity - Dentition: Adequate natural dentition Oral Motor / Sensory Function: Within functional limits Self-Feeding Abilities: Total assist Patient Positioning: Upright in chair Baseline Vocal Quality: Breathy;Low vocal intensity Volitional Cough: Weak Volitional Swallow: Able to elicit Anatomy: Other (Comment) (slight appearance of bony protrusion on cervical spine) Pharyngeal Secretions: Not observed secondary MBS    Reason for Referral Objectively evaluate swallowing function   Oral Phase Oral Preparation/Oral Phase Oral Phase: WFL   Pharyngeal Phase Pharyngeal Phase Pharyngeal Phase: Impaired Pharyngeal - Honey Pharyngeal - Honey Teaspoon: Reduced pharyngeal peristalsis;Reduced epiglottic inversion;Reduced anterior laryngeal mobility;Reduced laryngeal elevation;Reduced airway/laryngeal closure;Reduced tongue base retraction;Penetration/Aspiration during swallow;Penetration/Aspiration after swallow;Trace aspiration;Pharyngeal residue - valleculae;Pharyngeal residue - pyriform sinuses (chin tuck worsened aspiration) Penetration/Aspiration details (honey teaspoon): Material does not enter airway;Material enters airway, remains ABOVE vocal cords and not ejected out;Material enters airway, CONTACTS cords and not ejected out Pharyngeal - Nectar Pharyngeal - Nectar Teaspoon: Reduced pharyngeal peristalsis;Reduced epiglottic inversion;Reduced anterior laryngeal mobility;Reduced laryngeal elevation;Reduced airway/laryngeal closure;Reduced tongue base retraction;Penetration/Aspiration during swallow;Penetration/Aspiration after swallow;Trace aspiration;Pharyngeal residue - valleculae;Pharyngeal residue - pyriform sinuses (chin tuck worsened aspiration) Penetration/Aspiration details (nectar teaspoon): Material does not enter airway;Material enters airway, remains ABOVE vocal cords and not ejected out;Material enters airway, passes  BELOW cords without attempt by patient to eject out (silent aspiration);Material enters airway, CONTACTS cords and not ejected out Pharyngeal - Nectar Cup: Reduced pharyngeal peristalsis;Reduced epiglottic inversion;Reduced anterior laryngeal mobility;Reduced laryngeal elevation;Reduced airway/laryngeal closure;Reduced tongue base retraction;Penetration/Aspiration during swallow;Penetration/Aspiration after swallow;Trace aspiration;Pharyngeal residue - valleculae;Pharyngeal residue - pyriform sinuses Penetration/Aspiration details (nectar cup): Material enters airway, CONTACTS cords and not ejected out;Material enters airway, passes BELOW cords without attempt by patient to eject out (silent aspiration) Pharyngeal - Thin Pharyngeal - Ice Chips: Not tested Pharyngeal - Thin Teaspoon: Reduced pharyngeal peristalsis;Reduced epiglottic inversion;Reduced anterior laryngeal mobility;Reduced laryngeal elevation;Reduced airway/laryngeal closure;Reduced tongue base retraction;Penetration/Aspiration during swallow;Penetration/Aspiration after swallow;Pharyngeal residue - valleculae;Pharyngeal residue - pyriform sinuses Penetration/Aspiration details (thin teaspoon): Material enters airway, remains ABOVE vocal cords and not ejected out;Material enters airway, CONTACTS cords and not ejected out;Material does not enter airway Pharyngeal - Thin Cup: Reduced pharyngeal peristalsis;Reduced epiglottic inversion;Reduced anterior laryngeal mobility;Reduced laryngeal elevation;Reduced airway/laryngeal closure;Reduced tongue base retraction;Penetration/Aspiration during swallow;Penetration/Aspiration after swallow;Pharyngeal residue - valleculae;Pharyngeal residue - pyriform sinuses Penetration/Aspiration details (thin cup): Material enters airway, CONTACTS cords and not ejected out Pharyngeal - Solids Pharyngeal - Puree: Reduced pharyngeal peristalsis;Reduced epiglottic inversion;Reduced anterior laryngeal mobility;Reduced  laryngeal elevation;Reduced airway/laryngeal closure;Reduced tongue base retraction;Penetration/Aspiration after swallow;Pharyngeal residue - valleculae;Pharyngeal residue - pyriform sinuses Penetration/Aspiration details (puree): Material does not enter airway;Material enters airway, remains ABOVE vocal cords and not ejected out Pharyngeal - Mechanical Soft: Not tested  Cervical Esophageal Phase    GO    Cervical Esophageal Phase Cervical Esophageal Phase: Impaired Cervical Esophageal Phase - Comment Cervical Esophageal Comment: Appearance of mild protrusion of cervical spine at level of UES impedes complete passage to bolus with moderate residuals pooling in pyriform sinuses post swallow.  Harlon Ditty, [place] CCC-SLP [phone]  Claudine Mouton 04/17/2012, 11:06 AM

## 2012-04-17 NOTE — Progress Notes (Signed)
Pt arrived to unit from 57 accompanied by RN and NT. Pt's wife and daughter at bedside. Stroke notebook provided, patient safety explained, spouse verbalized understanding and signed. Patient stable, comfortable, resting call bell within reach. No further questions at this time.

## 2012-04-17 NOTE — Progress Notes (Signed)
Physical Therapy Treatment Patient Details Name: Connor Small MRN: [id number] DOB: May 24, 1960 Today's Date: 04/17/2012 Time: 8469-6295 PT Time Calculation (min): 25 min  PT Assessment / Plan / Recommendation Comments on Treatment Session  Tolerating sitting EOB but fatigues quickly. To rehab today.     Follow Up Recommendations  CIR     Does the patient have the potential to tolerate intense rehabilitation     Barriers to Discharge        Equipment Recommendations  None recommended by PT    Recommendations for Other Services    Frequency Min 3X/week   Plan Discharge plan remains appropriate;Frequency remains appropriate    Precautions / Restrictions Precautions Precautions: Fall Precaution Comments: Abd binder, ostomy Required Braces or Orthoses: Other Brace/Splint Other Brace/Splint: R wrist cock up slint Restrictions Weight Bearing Restrictions: No   Pertinent Vitals/Pain Did grimace about his incisional pain but with repositioning he felt better    Mobility  Bed Mobility Bed Mobility: Sit to Supine Rolling Left: 1: +2 Total assist Rolling Left: Patient Percentage: 20% Left Sidelying to Sit: 1: +2 Total assist Left Sidelying to Sit: Patient Percentage: 10% Sit to Supine: 1: +2 Total assist Sit to Supine: Patient Percentage: 0% Details for Bed Mobility Assistance: cues for sequencing, initiation and follow through due to weakness, assist to flex knees in prep for rolling Transfers Details for Transfer Assistance: pre-lift activities x2 with facilitation for trunk support as he flexed forward over feet, very limited effort in attempted to push through feet during this activity as he became more fatigued Ambulation/Gait Ambulation/Gait Assistance: Not tested (comment)    Exercises General Exercises - Upper Extremity Shoulder Flexion: AAROM;10 reps;Supine General Exercises - Lower Extremity Ankle Circles/Pumps: AAROM;Both;10 reps;Supine Heel Slides: AAROM;Both;10  reps;Supine Hip ABduction/ADduction: AAROM;Both;10 reps;Supine     PT Goals Acute Rehab PT Goals PT Goal: Supine/Side to Sit - Progress: Progressing toward goal PT Goal: Sit at [facility] Bed - Progress: Progressing toward goal PT Goal: Perform Home Exercise Program - Progress: Progressing toward goal  Visit Information  Last PT Received On: 04/17/12 Assistance Needed: +2    Subjective Data  Subjective: Agreeable to short therapy session today   Cognition  Cognition Overall Cognitive Status: Impaired Area of Impairment: Attention;Problem solving;Memory Difficult to assess due to: Impaired communication Arousal/Alertness: Awake/alert Orientation Level: Disoriented to;Time;Place Behavior During Session: Anxious Current Attention Level: Selective Attention - Other Comments: difficulty attending to session when needs to clear secretions. Memory Deficits: difficulty recalling previous sessions and how he has progressed; does recall being dropped and still fearful Following Commands: Follows one step commands consistently Problem Solving: Mod A Cognition - Other Comments: slow processing, self limited by anxiety about falling    Balance  Static Sitting Balance Static Sitting - Level of Assistance: 5: Stand by assistance;3: Mod assist;4: Min assist Static Sitting - Comment/# of Minutes: varying levels of support needing more as he fatigued, cues for tall posture and head control but as he fatigues he collapses into posterior tilt and forward head; sat EOB 10 minutes today and pt very vocal about wanting to lie back down because he was just too tired  End of Session PT - End of Session Equipment Utilized During Treatment: Gait belt Activity Tolerance: Patient limited by fatigue Patient left: in bed;with call bell/phone within reach   GP     [place] 04/17/2012, 4:11 PM

## 2012-04-17 NOTE — Progress Notes (Signed)
Transferred patient to 4036 per hospital bed.  Family at bedside during this transfer.  Report given to [place], Therapist, sports.

## 2012-04-17 NOTE — Discharge Summary (Signed)
low dose roxanol to hopefully decrease the amount of fentanyl IV uses.  3/19 Oxycodone resulted in N/V, will attempt methadone in its place at very low dose.  3/20 patient had some n/v with methadone but clearly pain has been better, I suspect that N/V were due to high volume of TF. Patient passed swallow evaluation but his PO intake is simply too minor to sustain caloric intake. Spoke with the family and patient, will continue with the methadone for now and decrease TF to 40 ml/hr. Goal is to d/c TF all together once is able to full take diet PO. Also complains of reflux. Will double protonix but doubt will improve as long as NGT is in place. PRN maalox.  04/13/2012 - appears to be tolerating and improving with methadone. Allow only for slow titration of methadone upwards every 5 day  03/23- Several comfort measures addressed. Tolerance of pain meds is borderline.  GLOBAL:  04/09/2012: Inpatient rehabilitation consultation requested plan to go 3-26     Labs at discharge Lab Results  Component Value Date   CREATININE 1.25 04/16/2012   BUN 45* 04/16/2012   NA 155* 04/16/2012   K 3.5 04/16/2012   CL 124* 04/16/2012   CO2 22 04/16/2012   Lab Results  Component Value Date   WBC 8.8 04/16/2012   HGB 7.6* 04/16/2012   HCT 24.7* 04/16/2012   MCV 102.1* 04/16/2012   PLT 212 04/16/2012   Lab Results  Component Value Date   ALT 47 04/16/2012   AST 31 04/16/2012   ALKPHOS 378* 04/16/2012   BILITOT 1.8* 04/16/2012   Lab Results  Component Value Date   INR 1.06 04/10/2012   INR 1.30 04/02/2012   INR 1.42 03/29/2012    Current radiology studies Dg Chest Port 1 View  04/16/2012  *RADIOLOGY REPORT*  Clinical Data: Hypoventilation.  PORTABLE CHEST - 1 VIEW  Comparison: 04/09/2012  Findings: 0523 hours.  Lung volumes are low.  Left base collapse / consolidation with effusion again noted.  NG tube has been removed in the interval and the tip of the feeding tube now overlies the mid  stomach.  Left IJ central line tip projects over the proximal SVC region. Telemetry leads overlie the chest.  IMPRESSION: Interval removal of NG tube with placement of feeding tube with tip projecting over the mid stomach.  Stable cardiopulmonary exam.   Original Report Authenticated By: Misty Stanley, M.D.    Dg Swallowing Func-speech Pathology  04/17/2012  [name], CCC-SLP     04/17/2012 11:12 AM Objective Swallowing Evaluation: Modified Barium Swallowing Study   Patient Details  Name: Connor Small MRN: [id number] Date of Birth: October 18, 1960  Today's Date: 04/17/2012 Time: 0940-1010 SLP Time Calculation (min): 30 min  Past Medical History:  Past Medical History  Diagnosis Date  . Chronic diarrhea   . Rectal bleed   . Hemorrhoids   . Ulcerative colitis     Distal UC over 8 yrs ago diagnosed  . Diverticulitis of large intestine with perforation 10/2011    done at [place]   Past Surgical History:  Past Surgical History  Procedure Laterality Date  . Colonoscopy  9/03  . Sigmoidoscopy       06/13/2002  . Temporary ostomy November 06, 2010      for a colon perforation that was done in [facility] (Dr  Payton Doughty).    . Colonoscopy  02/16/2011    Procedure: COLONOSCOPY;  Surgeon: Rogene Houston, MD;   Location:  low dose roxanol to hopefully decrease the amount of fentanyl IV uses.  3/19 Oxycodone resulted in N/V, will attempt methadone in its place at very low dose.  3/20 patient had some n/v with methadone but clearly pain has been better, I suspect that N/V were due to high volume of TF. Patient passed swallow evaluation but his PO intake is simply too minor to sustain caloric intake. Spoke with the family and patient, will continue with the methadone for now and decrease TF to 40 ml/hr. Goal is to d/c TF all together once is able to full take diet PO. Also complains of reflux. Will double protonix but doubt will improve as long as NGT is in place. PRN maalox.  04/13/2012 - appears to be tolerating and improving with methadone. Allow only for slow titration of methadone upwards every 5 day  03/23- Several comfort measures addressed. Tolerance of pain meds is borderline.  GLOBAL:  04/09/2012: Inpatient rehabilitation consultation requested plan to go 3-26     Labs at discharge Lab Results  Component Value Date   CREATININE 1.25 04/16/2012   BUN 45* 04/16/2012   NA 155* 04/16/2012   K 3.5 04/16/2012   CL 124* 04/16/2012   CO2 22 04/16/2012   Lab Results  Component Value Date   WBC 8.8 04/16/2012   HGB 7.6* 04/16/2012   HCT 24.7* 04/16/2012   MCV 102.1* 04/16/2012   PLT 212 04/16/2012   Lab Results  Component Value Date   ALT 47 04/16/2012   AST 31 04/16/2012   ALKPHOS 378* 04/16/2012   BILITOT 1.8* 04/16/2012   Lab Results  Component Value Date   INR 1.06 04/10/2012   INR 1.30 04/02/2012   INR 1.42 03/29/2012    Current radiology studies Dg Chest Port 1 View  04/16/2012  *RADIOLOGY REPORT*  Clinical Data: Hypoventilation.  PORTABLE CHEST - 1 VIEW  Comparison: 04/09/2012  Findings: 0523 hours.  Lung volumes are low.  Left base collapse / consolidation with effusion again noted.  NG tube has been removed in the interval and the tip of the feeding tube now overlies the mid  stomach.  Left IJ central line tip projects over the proximal SVC region. Telemetry leads overlie the chest.  IMPRESSION: Interval removal of NG tube with placement of feeding tube with tip projecting over the mid stomach.  Stable cardiopulmonary exam.   Original Report Authenticated By: Misty Stanley, M.D.    Dg Swallowing Func-speech Pathology  04/17/2012  [name], CCC-SLP     04/17/2012 11:12 AM Objective Swallowing Evaluation: Modified Barium Swallowing Study   Patient Details  Name: Connor Small MRN: [id number] Date of Birth: October 18, 1960  Today's Date: 04/17/2012 Time: 0940-1010 SLP Time Calculation (min): 30 min  Past Medical History:  Past Medical History  Diagnosis Date  . Chronic diarrhea   . Rectal bleed   . Hemorrhoids   . Ulcerative colitis     Distal UC over 8 yrs ago diagnosed  . Diverticulitis of large intestine with perforation 10/2011    done at [place]   Past Surgical History:  Past Surgical History  Procedure Laterality Date  . Colonoscopy  9/03  . Sigmoidoscopy       06/13/2002  . Temporary ostomy November 06, 2010      for a colon perforation that was done in [facility] (Dr  Payton Doughty).    . Colonoscopy  02/16/2011    Procedure: COLONOSCOPY;  Surgeon: Rogene Houston, MD;   Location:  low dose roxanol to hopefully decrease the amount of fentanyl IV uses.  3/19 Oxycodone resulted in N/V, will attempt methadone in its place at very low dose.  3/20 patient had some n/v with methadone but clearly pain has been better, I suspect that N/V were due to high volume of TF. Patient passed swallow evaluation but his PO intake is simply too minor to sustain caloric intake. Spoke with the family and patient, will continue with the methadone for now and decrease TF to 40 ml/hr. Goal is to d/c TF all together once is able to full take diet PO. Also complains of reflux. Will double protonix but doubt will improve as long as NGT is in place. PRN maalox.  04/13/2012 - appears to be tolerating and improving with methadone. Allow only for slow titration of methadone upwards every 5 day  03/23- Several comfort measures addressed. Tolerance of pain meds is borderline.  GLOBAL:  04/09/2012: Inpatient rehabilitation consultation requested plan to go 3-26     Labs at discharge Lab Results  Component Value Date   CREATININE 1.25 04/16/2012   BUN 45* 04/16/2012   NA 155* 04/16/2012   K 3.5 04/16/2012   CL 124* 04/16/2012   CO2 22 04/16/2012   Lab Results  Component Value Date   WBC 8.8 04/16/2012   HGB 7.6* 04/16/2012   HCT 24.7* 04/16/2012   MCV 102.1* 04/16/2012   PLT 212 04/16/2012   Lab Results  Component Value Date   ALT 47 04/16/2012   AST 31 04/16/2012   ALKPHOS 378* 04/16/2012   BILITOT 1.8* 04/16/2012   Lab Results  Component Value Date   INR 1.06 04/10/2012   INR 1.30 04/02/2012   INR 1.42 03/29/2012    Current radiology studies Dg Chest Port 1 View  04/16/2012  *RADIOLOGY REPORT*  Clinical Data: Hypoventilation.  PORTABLE CHEST - 1 VIEW  Comparison: 04/09/2012  Findings: 0523 hours.  Lung volumes are low.  Left base collapse / consolidation with effusion again noted.  NG tube has been removed in the interval and the tip of the feeding tube now overlies the mid  stomach.  Left IJ central line tip projects over the proximal SVC region. Telemetry leads overlie the chest.  IMPRESSION: Interval removal of NG tube with placement of feeding tube with tip projecting over the mid stomach.  Stable cardiopulmonary exam.   Original Report Authenticated By: Misty Stanley, M.D.    Dg Swallowing Func-speech Pathology  04/17/2012  [name], CCC-SLP     04/17/2012 11:12 AM Objective Swallowing Evaluation: Modified Barium Swallowing Study   Patient Details  Name: Connor Small MRN: [id number] Date of Birth: October 18, 1960  Today's Date: 04/17/2012 Time: 0940-1010 SLP Time Calculation (min): 30 min  Past Medical History:  Past Medical History  Diagnosis Date  . Chronic diarrhea   . Rectal bleed   . Hemorrhoids   . Ulcerative colitis     Distal UC over 8 yrs ago diagnosed  . Diverticulitis of large intestine with perforation 10/2011    done at [place]   Past Surgical History:  Past Surgical History  Procedure Laterality Date  . Colonoscopy  9/03  . Sigmoidoscopy       06/13/2002  . Temporary ostomy November 06, 2010      for a colon perforation that was done in [facility] (Dr  Payton Doughty).    . Colonoscopy  02/16/2011    Procedure: COLONOSCOPY;  Surgeon: Rogene Houston, MD;   Location:  Physician Discharge Summary  Patient ID: Connor Small MRN: [id number] DOB/AGE: August 26, 1960 52 y.o.  Admit date: 02/26/2012 Discharge date: 04/17/2012  Problem List Principal Problem:   S/p total colectomy for toxic megacolon, C diff Active Problems:   history of UC (ulcerative colitis)   ARF (acute renal failure)   Acute respiratory failure, resolved   Septic shock, resolved   Pleural effusion, L > R   Thrombocytopenia, unspecified   Hyperbilirubinemia, due to cholestasis   Atrial fibrillation, resolved   Anemia, mild. No evidence of active blood loss. 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 Underwent decompressive cecostomy 2/3, initially was better, then worsened on 2/4 with increased abdominal distension and tenderness, decreased renal function. He was taken back to the OR on 2/4 for total abdominal colectomy and end ileostomy. Post op he developed ARDS and sepsis shock and was being treated for this. The morning of 2/6 he had new onset of A-fib and transferred to [facility].   Hospital Course:  LINES / TUBES:  ETT ([facility]) 2/4>>> 2/20  PICC ([facility]) 2/4>>>2/11  Art Line ([facility]) 2/5>>>out  L IJ CVL 2/06 >> out  A line R fem 2/18>>>2/24  Rt ij HD 2/17 >> out  Left IJ 2/18 >>  ETT 3/3>>>out post op x 2  CULTURES:  BCx2 2/4>>>negative  BCx2 2/3>>>negative  UC 2/3- Negative  MRSA PCR 2/3- Negative  C diff 2/6>>>Positive  Body fluid 2/9>>>WBCs, no organisms  2/15 BC x 2 >> NEG  2/15 Sputum >>>NF  3/2 bc>>>NGTD  3/2 urine>>>neg  3/2 sputum>>>neg  3/5 cdiff>>>neg  PATH:  3/3 liver bx>>>BENIGN LIVER WITH CHOLESTASIS, The biopsy findings are that of extrahepatic biliary obstruction. No noted primary sclerosing cholangitis.  ANTIBIOTICS:  Flagyl 2/3>> d/c'd  Vanc 2/6 (IV) >>>2/7  azactam  2/6>>>2/7 Oral vanc 2/6>>>off  IV vanc 2/16 >2/24  Aztreonam 2/16 >2/17  Imipenem 2/17>>>2/24  mycofungin 2/18>>>2/24  cipro 3/3>>>3/6  azactam 3/2>>>3/3  vanc 3/2>>>3/5  Flagyl 3/3>>>3/5  SIGNIFICANT EVENTS / STUDIES:  2/2 Admit to [facility] with C diff, toxic megacolon  2/3 Decompressive Cecostomy  2/4 Total abdominal colectomy and end ileostomy  2/6 New A-fib and transfer to [facility]. Amiodarone initiated  2/7 Heparin initiated  2/9 thoracentesis left 1200 exudative  2/10 CT head: Old infarction in the right cerebellum and in the left frontal parietal white matter  2/10 CT abd/pelvis: small hematoma likely subcapsular spleen, JPs wnl  2/11 trial TFs  2/11 UE venous dopplers: Findings consistent with superficial vein thrombosis involving the right basilic vein  8/93 LE venous dopplers: No evidence of deep vein thrombosis  2/14 LUE venous doppler: No evidence of deep vein or superficial thrombosis involving the left upper extremity  2/15 neg 4 l bal on lasix drip, lasix d/c  2/16 poor neuro status, CT head: [facility]  2/17 CRRT initiated, high pressor needs  2/17 RUQ [place]: Gallbladder wall thickening in the absence of sludge or stones but no biliary ductal dilatation  2/18 reduced vasopressor reqts  2/19 New onset thrombocytopenia. Heparin D/C'd. Argatroban initiated  2/19 [place] abd (repeat): No explanation for elevated liver function tests  2/20 reduction pressors, improved alertness, extubated  2/20 LE venous dopplers: No evidence of deep vein or superficial thrombosis  2/21 bleeding overnight from JP's, argatroban turned off, lower  low dose roxanol to hopefully decrease the amount of fentanyl IV uses.  3/19 Oxycodone resulted in N/V, will attempt methadone in its place at very low dose.  3/20 patient had some n/v with methadone but clearly pain has been better, I suspect that N/V were due to high volume of TF. Patient passed swallow evaluation but his PO intake is simply too minor to sustain caloric intake. Spoke with the family and patient, will continue with the methadone for now and decrease TF to 40 ml/hr. Goal is to d/c TF all together once is able to full take diet PO. Also complains of reflux. Will double protonix but doubt will improve as long as NGT is in place. PRN maalox.  04/13/2012 - appears to be tolerating and improving with methadone. Allow only for slow titration of methadone upwards every 5 day  03/23- Several comfort measures addressed. Tolerance of pain meds is borderline.  GLOBAL:  04/09/2012: Inpatient rehabilitation consultation requested plan to go 3-26     Labs at discharge Lab Results  Component Value Date   CREATININE 1.25 04/16/2012   BUN 45* 04/16/2012   NA 155* 04/16/2012   K 3.5 04/16/2012   CL 124* 04/16/2012   CO2 22 04/16/2012   Lab Results  Component Value Date   WBC 8.8 04/16/2012   HGB 7.6* 04/16/2012   HCT 24.7* 04/16/2012   MCV 102.1* 04/16/2012   PLT 212 04/16/2012   Lab Results  Component Value Date   ALT 47 04/16/2012   AST 31 04/16/2012   ALKPHOS 378* 04/16/2012   BILITOT 1.8* 04/16/2012   Lab Results  Component Value Date   INR 1.06 04/10/2012   INR 1.30 04/02/2012   INR 1.42 03/29/2012    Current radiology studies Dg Chest Port 1 View  04/16/2012  *RADIOLOGY REPORT*  Clinical Data: Hypoventilation.  PORTABLE CHEST - 1 VIEW  Comparison: 04/09/2012  Findings: 0523 hours.  Lung volumes are low.  Left base collapse / consolidation with effusion again noted.  NG tube has been removed in the interval and the tip of the feeding tube now overlies the mid  stomach.  Left IJ central line tip projects over the proximal SVC region. Telemetry leads overlie the chest.  IMPRESSION: Interval removal of NG tube with placement of feeding tube with tip projecting over the mid stomach.  Stable cardiopulmonary exam.   Original Report Authenticated By: Misty Stanley, M.D.    Dg Swallowing Func-speech Pathology  04/17/2012  [name], CCC-SLP     04/17/2012 11:12 AM Objective Swallowing Evaluation: Modified Barium Swallowing Study   Patient Details  Name: Connor Small MRN: [id number] Date of Birth: October 18, 1960  Today's Date: 04/17/2012 Time: 0940-1010 SLP Time Calculation (min): 30 min  Past Medical History:  Past Medical History  Diagnosis Date  . Chronic diarrhea   . Rectal bleed   . Hemorrhoids   . Ulcerative colitis     Distal UC over 8 yrs ago diagnosed  . Diverticulitis of large intestine with perforation 10/2011    done at [place]   Past Surgical History:  Past Surgical History  Procedure Laterality Date  . Colonoscopy  9/03  . Sigmoidoscopy       06/13/2002  . Temporary ostomy November 06, 2010      for a colon perforation that was done in [facility] (Dr  Payton Doughty).    . Colonoscopy  02/16/2011    Procedure: COLONOSCOPY;  Surgeon: Rogene Houston, MD;   Location:  low dose roxanol to hopefully decrease the amount of fentanyl IV uses.  3/19 Oxycodone resulted in N/V, will attempt methadone in its place at very low dose.  3/20 patient had some n/v with methadone but clearly pain has been better, I suspect that N/V were due to high volume of TF. Patient passed swallow evaluation but his PO intake is simply too minor to sustain caloric intake. Spoke with the family and patient, will continue with the methadone for now and decrease TF to 40 ml/hr. Goal is to d/c TF all together once is able to full take diet PO. Also complains of reflux. Will double protonix but doubt will improve as long as NGT is in place. PRN maalox.  04/13/2012 - appears to be tolerating and improving with methadone. Allow only for slow titration of methadone upwards every 5 day  03/23- Several comfort measures addressed. Tolerance of pain meds is borderline.  GLOBAL:  04/09/2012: Inpatient rehabilitation consultation requested plan to go 3-26     Labs at discharge Lab Results  Component Value Date   CREATININE 1.25 04/16/2012   BUN 45* 04/16/2012   NA 155* 04/16/2012   K 3.5 04/16/2012   CL 124* 04/16/2012   CO2 22 04/16/2012   Lab Results  Component Value Date   WBC 8.8 04/16/2012   HGB 7.6* 04/16/2012   HCT 24.7* 04/16/2012   MCV 102.1* 04/16/2012   PLT 212 04/16/2012   Lab Results  Component Value Date   ALT 47 04/16/2012   AST 31 04/16/2012   ALKPHOS 378* 04/16/2012   BILITOT 1.8* 04/16/2012   Lab Results  Component Value Date   INR 1.06 04/10/2012   INR 1.30 04/02/2012   INR 1.42 03/29/2012    Current radiology studies Dg Chest Port 1 View  04/16/2012  *RADIOLOGY REPORT*  Clinical Data: Hypoventilation.  PORTABLE CHEST - 1 VIEW  Comparison: 04/09/2012  Findings: 0523 hours.  Lung volumes are low.  Left base collapse / consolidation with effusion again noted.  NG tube has been removed in the interval and the tip of the feeding tube now overlies the mid  stomach.  Left IJ central line tip projects over the proximal SVC region. Telemetry leads overlie the chest.  IMPRESSION: Interval removal of NG tube with placement of feeding tube with tip projecting over the mid stomach.  Stable cardiopulmonary exam.   Original Report Authenticated By: Misty Stanley, M.D.    Dg Swallowing Func-speech Pathology  04/17/2012  [name], CCC-SLP     04/17/2012 11:12 AM Objective Swallowing Evaluation: Modified Barium Swallowing Study   Patient Details  Name: Connor Small MRN: [id number] Date of Birth: October 18, 1960  Today's Date: 04/17/2012 Time: 0940-1010 SLP Time Calculation (min): 30 min  Past Medical History:  Past Medical History  Diagnosis Date  . Chronic diarrhea   . Rectal bleed   . Hemorrhoids   . Ulcerative colitis     Distal UC over 8 yrs ago diagnosed  . Diverticulitis of large intestine with perforation 10/2011    done at [place]   Past Surgical History:  Past Surgical History  Procedure Laterality Date  . Colonoscopy  9/03  . Sigmoidoscopy       06/13/2002  . Temporary ostomy November 06, 2010      for a colon perforation that was done in [facility] (Dr  Payton Doughty).    . Colonoscopy  02/16/2011    Procedure: COLONOSCOPY;  Surgeon: Rogene Houston, MD;   Location:  low dose roxanol to hopefully decrease the amount of fentanyl IV uses.  3/19 Oxycodone resulted in N/V, will attempt methadone in its place at very low dose.  3/20 patient had some n/v with methadone but clearly pain has been better, I suspect that N/V were due to high volume of TF. Patient passed swallow evaluation but his PO intake is simply too minor to sustain caloric intake. Spoke with the family and patient, will continue with the methadone for now and decrease TF to 40 ml/hr. Goal is to d/c TF all together once is able to full take diet PO. Also complains of reflux. Will double protonix but doubt will improve as long as NGT is in place. PRN maalox.  04/13/2012 - appears to be tolerating and improving with methadone. Allow only for slow titration of methadone upwards every 5 day  03/23- Several comfort measures addressed. Tolerance of pain meds is borderline.  GLOBAL:  04/09/2012: Inpatient rehabilitation consultation requested plan to go 3-26     Labs at discharge Lab Results  Component Value Date   CREATININE 1.25 04/16/2012   BUN 45* 04/16/2012   NA 155* 04/16/2012   K 3.5 04/16/2012   CL 124* 04/16/2012   CO2 22 04/16/2012   Lab Results  Component Value Date   WBC 8.8 04/16/2012   HGB 7.6* 04/16/2012   HCT 24.7* 04/16/2012   MCV 102.1* 04/16/2012   PLT 212 04/16/2012   Lab Results  Component Value Date   ALT 47 04/16/2012   AST 31 04/16/2012   ALKPHOS 378* 04/16/2012   BILITOT 1.8* 04/16/2012   Lab Results  Component Value Date   INR 1.06 04/10/2012   INR 1.30 04/02/2012   INR 1.42 03/29/2012    Current radiology studies Dg Chest Port 1 View  04/16/2012  *RADIOLOGY REPORT*  Clinical Data: Hypoventilation.  PORTABLE CHEST - 1 VIEW  Comparison: 04/09/2012  Findings: 0523 hours.  Lung volumes are low.  Left base collapse / consolidation with effusion again noted.  NG tube has been removed in the interval and the tip of the feeding tube now overlies the mid  stomach.  Left IJ central line tip projects over the proximal SVC region. Telemetry leads overlie the chest.  IMPRESSION: Interval removal of NG tube with placement of feeding tube with tip projecting over the mid stomach.  Stable cardiopulmonary exam.   Original Report Authenticated By: Misty Stanley, M.D.    Dg Swallowing Func-speech Pathology  04/17/2012  [name], CCC-SLP     04/17/2012 11:12 AM Objective Swallowing Evaluation: Modified Barium Swallowing Study   Patient Details  Name: Connor Small MRN: [id number] Date of Birth: October 18, 1960  Today's Date: 04/17/2012 Time: 0940-1010 SLP Time Calculation (min): 30 min  Past Medical History:  Past Medical History  Diagnosis Date  . Chronic diarrhea   . Rectal bleed   . Hemorrhoids   . Ulcerative colitis     Distal UC over 8 yrs ago diagnosed  . Diverticulitis of large intestine with perforation 10/2011    done at [place]   Past Surgical History:  Past Surgical History  Procedure Laterality Date  . Colonoscopy  9/03  . Sigmoidoscopy       06/13/2002  . Temporary ostomy November 06, 2010      for a colon perforation that was done in [facility] (Dr  Payton Doughty).    . Colonoscopy  02/16/2011    Procedure: COLONOSCOPY;  Surgeon: Rogene Houston, MD;   Location:  low dose roxanol to hopefully decrease the amount of fentanyl IV uses.  3/19 Oxycodone resulted in N/V, will attempt methadone in its place at very low dose.  3/20 patient had some n/v with methadone but clearly pain has been better, I suspect that N/V were due to high volume of TF. Patient passed swallow evaluation but his PO intake is simply too minor to sustain caloric intake. Spoke with the family and patient, will continue with the methadone for now and decrease TF to 40 ml/hr. Goal is to d/c TF all together once is able to full take diet PO. Also complains of reflux. Will double protonix but doubt will improve as long as NGT is in place. PRN maalox.  04/13/2012 - appears to be tolerating and improving with methadone. Allow only for slow titration of methadone upwards every 5 day  03/23- Several comfort measures addressed. Tolerance of pain meds is borderline.  GLOBAL:  04/09/2012: Inpatient rehabilitation consultation requested plan to go 3-26     Labs at discharge Lab Results  Component Value Date   CREATININE 1.25 04/16/2012   BUN 45* 04/16/2012   NA 155* 04/16/2012   K 3.5 04/16/2012   CL 124* 04/16/2012   CO2 22 04/16/2012   Lab Results  Component Value Date   WBC 8.8 04/16/2012   HGB 7.6* 04/16/2012   HCT 24.7* 04/16/2012   MCV 102.1* 04/16/2012   PLT 212 04/16/2012   Lab Results  Component Value Date   ALT 47 04/16/2012   AST 31 04/16/2012   ALKPHOS 378* 04/16/2012   BILITOT 1.8* 04/16/2012   Lab Results  Component Value Date   INR 1.06 04/10/2012   INR 1.30 04/02/2012   INR 1.42 03/29/2012    Current radiology studies Dg Chest Port 1 View  04/16/2012  *RADIOLOGY REPORT*  Clinical Data: Hypoventilation.  PORTABLE CHEST - 1 VIEW  Comparison: 04/09/2012  Findings: 0523 hours.  Lung volumes are low.  Left base collapse / consolidation with effusion again noted.  NG tube has been removed in the interval and the tip of the feeding tube now overlies the mid  stomach.  Left IJ central line tip projects over the proximal SVC region. Telemetry leads overlie the chest.  IMPRESSION: Interval removal of NG tube with placement of feeding tube with tip projecting over the mid stomach.  Stable cardiopulmonary exam.   Original Report Authenticated By: Misty Stanley, M.D.    Dg Swallowing Func-speech Pathology  04/17/2012  [name], CCC-SLP     04/17/2012 11:12 AM Objective Swallowing Evaluation: Modified Barium Swallowing Study   Patient Details  Name: Connor Small MRN: [id number] Date of Birth: October 18, 1960  Today's Date: 04/17/2012 Time: 0940-1010 SLP Time Calculation (min): 30 min  Past Medical History:  Past Medical History  Diagnosis Date  . Chronic diarrhea   . Rectal bleed   . Hemorrhoids   . Ulcerative colitis     Distal UC over 8 yrs ago diagnosed  . Diverticulitis of large intestine with perforation 10/2011    done at [place]   Past Surgical History:  Past Surgical History  Procedure Laterality Date  . Colonoscopy  9/03  . Sigmoidoscopy       06/13/2002  . Temporary ostomy November 06, 2010      for a colon perforation that was done in [facility] (Dr  Payton Doughty).    . Colonoscopy  02/16/2011    Procedure: COLONOSCOPY;  Surgeon: Rogene Houston, MD;   Location:

## 2012-04-17 NOTE — Progress Notes (Signed)
Chaplain Note:  Chaplain visited with pt and family.  Pt was resting in bed, awake, alert, and oriented.  Pt's wife and daughter were at bedside.  Chaplain provided spiritual comfort, support, and prayer for pt and pt's family.  Pt and family expressed appreciation for chaplain support.    Spiritual Assessment: Pt and family are emotionally and spiritually exhausted from many days of hospitalization.  Pt feels a strong need of daily prayer to help him and his family endure the ongoing process of recovery.  Chaplain will follow up as requested.   04/17/12 2300  Clinical Encounter Type  Visited With Patient and family together  Visit Type Spiritual support  Referral From Patient  Recommendations Pt requests daily visits for prayer  Spiritual Encounters  Spiritual Needs Prayer;Emotional  Stress Factors  Patient Stress Factors Major life changes;Health changes;Exhausted  Family Stress Factors Exhausted;Major life changes   Jearld Lesch, [place] [phone]

## 2012-04-17 NOTE — Progress Notes (Signed)
CHEST - 1 VIEW  Comparison: 04/09/2012  Findings: 0523 hours.  Lung volumes are low.  Left base collapse / consolidation with effusion again noted.  NG tube has been removed in the interval and the tip of the feeding tube now overlies the mid stomach.  Left IJ central line tip projects over the proximal SVC region. Telemetry leads overlie the chest.  IMPRESSION: Interval removal of NG tube with placement of feeding tube with tip projecting over the mid stomach.  Stable cardiopulmonary exam.   Original Report Authenticated By: Connor Small, M.D.    Dg Swallowing Func-speech Pathology  04/17/2012  Connor Small, CCC-SLP     04/17/2012 11:12 AM Objective Swallowing Evaluation: Modified Barium Swallowing Study   Patient Details  Name: Connor Small MRN: [id number] Date of Birth: November 25, 1960  Today's Date: 04/17/2012 Time: 0940-1010 SLP Time Calculation (min): 30 min  Past Medical History:  Past Medical History  Diagnosis Date  . Chronic diarrhea   . Rectal bleed   . Hemorrhoids   . Ulcerative colitis     Distal UC over 8 yrs ago diagnosed  . Diverticulitis of large intestine with perforation 10/2011    done at [place]   Past Surgical History:  Past Surgical History  Procedure Laterality Date  . Colonoscopy  9/03  . Sigmoidoscopy       06/13/2002  . Temporary ostomy November 06, 2010      for a colon perforation that was done in [facility] (Dr  Payton Doughty).    . Colonoscopy  02/16/2011    Procedure: COLONOSCOPY;  Surgeon: Rogene Houston, MD;   [facility];  Service: Endoscopy;  Laterality: N/A;   100  . Flexible sigmoidoscopy  02/27/2012    Procedure: FLEXIBLE SIGMOIDOSCOPY;  Surgeon: Rogene Houston,  MD;  [facility];  Service: Endoscopy;  Laterality:  N/A;  with colonic decompression  . Laparotomy  02/27/2012    Procedure: EXPLORATORY LAPAROTOMY;  Surgeon: Donato Heinz,  MD;  [facility];  Service: General;  Laterality: N/A;  . Cecostomy  02/27/2012    Procedure: CECOSTOMY;  Surgeon: Donato Heinz, MD;  [facility];  Service: General;  Laterality: N/A;  Cecostomy Tube  Placement  . Colectomy  02/28/2012    Procedure: TOTAL COLECTOMY;  Surgeon: Donato Heinz, MD;   [facility];  Service: General;  Laterality: N/A;  . Ileostomy  02/28/2012    Procedure: ILEOSTOMY;  Surgeon: Donato Heinz, MD;  [facility];  Service: General;  Laterality: N/A;  . Cystoscopy w/ ureteral stent placement Bilateral 03/20/2012    Procedure: CYSTOSCOPY WITH RETROGRADE PYELOGRAM/URETERAL STENT  PLACEMENT;  Surgeon:  Alexis Frock, MD;  Location: [place];   Service: Urology;  Laterality: Bilateral;  . Laparotomy N/A 03/26/2012    Procedure: EXPLORATORY LAPAROTOMY;  Surgeon: Edward Jolly, MD;  Location: [place];  Service: General;  Laterality:  N/A;  . Application of wound vac N/A 03/26/2012    Procedure: APPLICATION OF WOUND VAC;  Surgeon: Edward Jolly, MD;  [facility];  Service: General;  Laterality:  N/A;  . Liver biopsy N/A 03/26/2012    Procedure: LIVER BIOPSY;  Surgeon: Edward Jolly, MD;   Location: [place];  Service: General;  Laterality: N/A;  . Laparotomy N/A 03/29/2012    Procedure: EXPLORATORY LAPAROTOMY, PARTIAL WOUND CLOSURE;   Surgeon: Edward Jolly, MD;  Location: [place];  Service:  PULMONARY  / CRITICAL CARE MEDICINE  Name: Connor Small MRN: [id number] DOB: 1960/04/26    ADMISSION DATE:  02/26/2012 CONSULTATION DATE:  2/5  REFERRING MD :  Anastasio Champion  CHIEF COMPLAINT:  Acute resp failure   BRIEF PATIENT DESCRIPTION:  [name] admitted to [place] for Cdiff on 2/2. Underwent decompressive cecostomy 2/3, initially was better, then worsened on 2/4 with increased abdominal distension and tenderness, decreased renal function.  He was taken back to the OR on 2/4 for total abdominal colectomy and end ileostomy.  Post op he developed ARDS and septic shock and was being treated for this.  The morning of 2/6 he had new onset of A-fib and transferred to [facility].   Course complicated by severe hypoalbuminemia, elevated LFTs  -cholestatic without biliary obstruction & pre-renal failure. Hypotensive 3/2 am requiring albumin  LINES / TUBES: ETT ([facility]) 2/4>>> 2/20 PICC ([facility]) 2/4>>>2/11 Art Line ([facility]) 2/5>>>out L IJ CVL 2/06 >> out A line R fem 2/18>>>2/24 Rt ij HD 2/17 >> out Left IJ 2/18 >>  ETT 3/3>>>out post op x 2  CULTURES: BCx2 2/4>>>negative BCx2 2/3>>>negative UC 2/3- Negative MRSA PCR 2/3- Negative C diff 2/6>>>Positive Body fluid 2/9>>>WBCs, no organisms 2/15 BC x 2 >> NEG 2/15 Sputum >>>NF 3/2 bc>>>NGTD 3/2 urine>>>neg 3/2 sputum>>>neg 3/5 cdiff>>>neg  PATH: 3/3 liver bx>>>BENIGN LIVER WITH CHOLESTASIS, The biopsy findings are that of extrahepatic biliary obstruction. No noted primary sclerosing cholangitis.  ANTIBIOTICS: Flagyl 2/3>> d/c'd Vanc 2/6 (IV) >>>2/7 azactam 2/6>>>2/7 Oral vanc 2/6>>>off IV vanc 2/16 >2/24 Aztreonam 2/16 >2/17 Imipenem 2/17>>>2/24 mycofungin 2/18>>>2/24  cipro 3/3>>>3/6 azactam 3/2>>>3/3 vanc 3/2>>>3/5 Flagyl 3/3>>>3/5   SIGNIFICANT EVENTS / STUDIES:  2/2 Admit to [facility] with C diff, toxic megacolon 2/3 Decompressive Cecostomy 2/4 Total abdominal colectomy and end ileostomy 2/6 New A-fib and transfer to [facility]. 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 2/10 CT abd/pelvis: small hematoma likely subcapsular spleen, JPs wnl 2/11 trial TFs  2/11 UE venous dopplers: Findings consistent with superficial vein thrombosis involving the right basilic vein 9/56 LE venous dopplers: No evidence of deep vein thrombosis 2/14 LUE venous doppler: No evidence of deep vein or superficial thrombosis involving the left upper extremity  2/15 neg 4 l bal on lasix drip, lasix d/c  2/16 poor neuro status, CT head: [facility] 2/17 CRRT initiated, high pressor needs 2/17 RUQ [place]: Gallbladder wall thickening in the absence of sludge or stones but no biliary ductal dilatation 2/18 reduced vasopressor reqts 2/19 New onset thrombocytopenia. Heparin D/C'd. Argatroban initiated 2/19 [place] abd (repeat): No explanation for elevated liver function tests 2/20 reduction pressors, improved alertness, extubated 2/20 LE venous dopplers: No evidence of deep vein or superficial thrombosis 2/21 bleeding overnight from JP's, argatroban turned off, lower pressor needs 2/22 off pressors, on RA, converted to NSR 2/22 Carotid dopplers: No significant extracranial carotid artery stenosis demonstrated 2/25 Cystoscopy with bilateral retrograde pyelograms. No extravasation of contrast from either ureteral orifice. No evidence of hydroureteronephrosis. Unremarkable urinary bladder. B ureteral stents placed 2/27 MRI brain: Scattered small acute to subacute infarcts in both cerebral hemispheres, brainstem, and right cerebellum. Favor sequelae of emboli from a cardiac or proximal aortic source. Main alternate consideration is hypotensive episode 2/27 MRCP: No intrahepatic bile duct dilatation or common bile duct dilatation. No gallstones identified. Large subcapsular fluid collection encases the spleen. This is of  indeterminate etiology.  CHEST - 1 VIEW  Comparison: 04/09/2012  Findings: 0523 hours.  Lung volumes are low.  Left base collapse / consolidation with effusion again noted.  NG tube has been removed in the interval and the tip of the feeding tube now overlies the mid stomach.  Left IJ central line tip projects over the proximal SVC region. Telemetry leads overlie the chest.  IMPRESSION: Interval removal of NG tube with placement of feeding tube with tip projecting over the mid stomach.  Stable cardiopulmonary exam.   Original Report Authenticated By: Connor Small, M.D.    Dg Swallowing Func-speech Pathology  04/17/2012  Connor Small, CCC-SLP     04/17/2012 11:12 AM Objective Swallowing Evaluation: Modified Barium Swallowing Study   Patient Details  Name: Connor Small MRN: [id number] Date of Birth: November 25, 1960  Today's Date: 04/17/2012 Time: 0940-1010 SLP Time Calculation (min): 30 min  Past Medical History:  Past Medical History  Diagnosis Date  . Chronic diarrhea   . Rectal bleed   . Hemorrhoids   . Ulcerative colitis     Distal UC over 8 yrs ago diagnosed  . Diverticulitis of large intestine with perforation 10/2011    done at [place]   Past Surgical History:  Past Surgical History  Procedure Laterality Date  . Colonoscopy  9/03  . Sigmoidoscopy       06/13/2002  . Temporary ostomy November 06, 2010      for a colon perforation that was done in [facility] (Dr  Payton Doughty).    . Colonoscopy  02/16/2011    Procedure: COLONOSCOPY;  Surgeon: Rogene Houston, MD;   [facility];  Service: Endoscopy;  Laterality: N/A;   100  . Flexible sigmoidoscopy  02/27/2012    Procedure: FLEXIBLE SIGMOIDOSCOPY;  Surgeon: Rogene Houston,  MD;  [facility];  Service: Endoscopy;  Laterality:  N/A;  with colonic decompression  . Laparotomy  02/27/2012    Procedure: EXPLORATORY LAPAROTOMY;  Surgeon: Donato Heinz,  MD;  [facility];  Service: General;  Laterality: N/A;  . Cecostomy  02/27/2012    Procedure: CECOSTOMY;  Surgeon: Donato Heinz, MD;  [facility];  Service: General;  Laterality: N/A;  Cecostomy Tube  Placement  . Colectomy  02/28/2012    Procedure: TOTAL COLECTOMY;  Surgeon: Donato Heinz, MD;   [facility];  Service: General;  Laterality: N/A;  . Ileostomy  02/28/2012    Procedure: ILEOSTOMY;  Surgeon: Donato Heinz, MD;  [facility];  Service: General;  Laterality: N/A;  . Cystoscopy w/ ureteral stent placement Bilateral 03/20/2012    Procedure: CYSTOSCOPY WITH RETROGRADE PYELOGRAM/URETERAL STENT  PLACEMENT;  Surgeon:  Alexis Frock, MD;  Location: [place];   Service: Urology;  Laterality: Bilateral;  . Laparotomy N/A 03/26/2012    Procedure: EXPLORATORY LAPAROTOMY;  Surgeon: Edward Jolly, MD;  Location: [place];  Service: General;  Laterality:  N/A;  . Application of wound vac N/A 03/26/2012    Procedure: APPLICATION OF WOUND VAC;  Surgeon: Edward Jolly, MD;  [facility];  Service: General;  Laterality:  N/A;  . Liver biopsy N/A 03/26/2012    Procedure: LIVER BIOPSY;  Surgeon: Edward Jolly, MD;   Location: [place];  Service: General;  Laterality: N/A;  . Laparotomy N/A 03/29/2012    Procedure: EXPLORATORY LAPAROTOMY, PARTIAL WOUND CLOSURE;   Surgeon: Edward Jolly, MD;  Location: [place];  Service:  CHEST - 1 VIEW  Comparison: 04/09/2012  Findings: 0523 hours.  Lung volumes are low.  Left base collapse / consolidation with effusion again noted.  NG tube has been removed in the interval and the tip of the feeding tube now overlies the mid stomach.  Left IJ central line tip projects over the proximal SVC region. Telemetry leads overlie the chest.  IMPRESSION: Interval removal of NG tube with placement of feeding tube with tip projecting over the mid stomach.  Stable cardiopulmonary exam.   Original Report Authenticated By: Connor Small, M.D.    Dg Swallowing Func-speech Pathology  04/17/2012  Connor Small, CCC-SLP     04/17/2012 11:12 AM Objective Swallowing Evaluation: Modified Barium Swallowing Study   Patient Details  Name: Connor Small MRN: [id number] Date of Birth: November 25, 1960  Today's Date: 04/17/2012 Time: 0940-1010 SLP Time Calculation (min): 30 min  Past Medical History:  Past Medical History  Diagnosis Date  . Chronic diarrhea   . Rectal bleed   . Hemorrhoids   . Ulcerative colitis     Distal UC over 8 yrs ago diagnosed  . Diverticulitis of large intestine with perforation 10/2011    done at [place]   Past Surgical History:  Past Surgical History  Procedure Laterality Date  . Colonoscopy  9/03  . Sigmoidoscopy       06/13/2002  . Temporary ostomy November 06, 2010      for a colon perforation that was done in [facility] (Dr  Payton Doughty).    . Colonoscopy  02/16/2011    Procedure: COLONOSCOPY;  Surgeon: Rogene Houston, MD;   [facility];  Service: Endoscopy;  Laterality: N/A;   100  . Flexible sigmoidoscopy  02/27/2012    Procedure: FLEXIBLE SIGMOIDOSCOPY;  Surgeon: Rogene Houston,  MD;  [facility];  Service: Endoscopy;  Laterality:  N/A;  with colonic decompression  . Laparotomy  02/27/2012    Procedure: EXPLORATORY LAPAROTOMY;  Surgeon: Donato Heinz,  MD;  [facility];  Service: General;  Laterality: N/A;  . Cecostomy  02/27/2012    Procedure: CECOSTOMY;  Surgeon: Donato Heinz, MD;  [facility];  Service: General;  Laterality: N/A;  Cecostomy Tube  Placement  . Colectomy  02/28/2012    Procedure: TOTAL COLECTOMY;  Surgeon: Donato Heinz, MD;   [facility];  Service: General;  Laterality: N/A;  . Ileostomy  02/28/2012    Procedure: ILEOSTOMY;  Surgeon: Donato Heinz, MD;  [facility];  Service: General;  Laterality: N/A;  . Cystoscopy w/ ureteral stent placement Bilateral 03/20/2012    Procedure: CYSTOSCOPY WITH RETROGRADE PYELOGRAM/URETERAL STENT  PLACEMENT;  Surgeon:  Alexis Frock, MD;  Location: [place];   Service: Urology;  Laterality: Bilateral;  . Laparotomy N/A 03/26/2012    Procedure: EXPLORATORY LAPAROTOMY;  Surgeon: Edward Jolly, MD;  Location: [place];  Service: General;  Laterality:  N/A;  . Application of wound vac N/A 03/26/2012    Procedure: APPLICATION OF WOUND VAC;  Surgeon: Edward Jolly, MD;  [facility];  Service: General;  Laterality:  N/A;  . Liver biopsy N/A 03/26/2012    Procedure: LIVER BIOPSY;  Surgeon: Edward Jolly, MD;   Location: [place];  Service: General;  Laterality: N/A;  . Laparotomy N/A 03/29/2012    Procedure: EXPLORATORY LAPAROTOMY, PARTIAL WOUND CLOSURE;   Surgeon: Edward Jolly, MD;  Location: [place];  Service:  without attempt by patient to eject  out (silent aspiration) Pharyngeal - Thin Pharyngeal - Ice Chips: Not tested Pharyngeal - Thin Teaspoon: Reduced pharyngeal  peristalsis;Reduced epiglottic inversion;Reduced anterior  laryngeal mobility;Reduced laryngeal elevation;Reduced  airway/laryngeal closure;Reduced tongue base  retraction;Penetration/Aspiration during  swallow;Penetration/Aspiration after swallow;Pharyngeal residue -  valleculae;Pharyngeal residue - pyriform sinuses Penetration/Aspiration details (thin teaspoon): Material enters  airway, remains ABOVE vocal cords and not ejected out;Material  enters airway, CONTACTS cords and not ejected out;Material does  not enter airway Pharyngeal - Thin Cup: Reduced pharyngeal peristalsis;Reduced  epiglottic inversion;Reduced anterior laryngeal mobility;Reduced  laryngeal elevation;Reduced airway/laryngeal closure;Reduced  tongue base retraction;Penetration/Aspiration during  swallow;Penetration/Aspiration after swallow;Pharyngeal residue -  valleculae;Pharyngeal residue - pyriform sinuses Penetration/Aspiration details (thin cup): Material enters  airway, CONTACTS cords and not ejected out Pharyngeal - Solids Pharyngeal - Puree: Reduced  pharyngeal peristalsis;Reduced  epiglottic inversion;Reduced anterior laryngeal mobility;Reduced  laryngeal elevation;Reduced airway/laryngeal closure;Reduced  tongue base retraction;Penetration/Aspiration after  swallow;Pharyngeal residue - valleculae;Pharyngeal residue -  pyriform sinuses Penetration/Aspiration details (puree): Material does not enter  airway;Material enters airway, remains ABOVE vocal cords and not  ejected out Pharyngeal - Mechanical Soft: Not tested  Cervical Esophageal Phase    GO    Cervical Esophageal Phase Cervical Esophageal Phase: Impaired Cervical Esophageal Phase - Comment Cervical Esophageal Comment: Appearance of mild protrusion of  cervical spine at level of UES impedes complete passage to bolus  with moderate residuals pooling in pyriform sinuses post swallow.          [name], MA CCC-SLP [phone]  [name] 04/17/2012, 11:06 AM     Imaging x 48 hours Dg Chest [facility]  04/16/2012  *RADIOLOGY REPORT*  Clinical Data: Hypoventilation.  PORTABLE CHEST - 1 VIEW  Comparison: 04/09/2012  Findings: 0523 hours.  Lung volumes are low.  Left base collapse / consolidation with effusion again noted.  NG tube has been removed in the interval and the tip of the feeding tube now overlies the mid stomach.  Left IJ central line tip projects over the proximal SVC region. Telemetry leads overlie the chest.  IMPRESSION: Interval removal of NG tube with placement of feeding tube with tip projecting over the mid stomach.  Stable cardiopulmonary exam.   Original Report Authenticated By: Connor Small, M.D.    Dg Swallowing Func-speech Pathology  04/17/2012  Connor Small, CCC-SLP     04/17/2012 11:12 AM Objective Swallowing Evaluation: Modified Barium Swallowing Study   Patient Details  Name: Connor Small MRN: [id number] Date of Birth: 1960/03/10  Today's Date: 04/17/2012 Time: 0940-1010 SLP Time Calculation (min): 30 min  Past Medical History:  Past Medical History   Diagnosis Date  . Chronic diarrhea   . Rectal bleed   . Hemorrhoids   . Ulcerative colitis     Distal UC over 8 yrs ago diagnosed  . Diverticulitis of large intestine with perforation 10/2011    done at [place]   Past Surgical History:  Past Surgical History  Procedure Laterality Date  . Colonoscopy  9/03  . Sigmoidoscopy       06/13/2002  . Temporary ostomy November 06, 2010      for a colon perforation that was done in [facility] (Dr  Payton Doughty).    . Colonoscopy  02/16/2011    Procedure: COLONOSCOPY;  Surgeon: Rogene Houston, MD;   [facility];  Service: Endoscopy;  Laterality: N/A;   100  . Flexible sigmoidoscopy  02/27/2012    Procedure: FLEXIBLE SIGMOIDOSCOPY;  CHEST - 1 VIEW  Comparison: 04/09/2012  Findings: 0523 hours.  Lung volumes are low.  Left base collapse / consolidation with effusion again noted.  NG tube has been removed in the interval and the tip of the feeding tube now overlies the mid stomach.  Left IJ central line tip projects over the proximal SVC region. Telemetry leads overlie the chest.  IMPRESSION: Interval removal of NG tube with placement of feeding tube with tip projecting over the mid stomach.  Stable cardiopulmonary exam.   Original Report Authenticated By: Connor Small, M.D.    Dg Swallowing Func-speech Pathology  04/17/2012  Connor Small, CCC-SLP     04/17/2012 11:12 AM Objective Swallowing Evaluation: Modified Barium Swallowing Study   Patient Details  Name: Connor Small MRN: [id number] Date of Birth: November 25, 1960  Today's Date: 04/17/2012 Time: 0940-1010 SLP Time Calculation (min): 30 min  Past Medical History:  Past Medical History  Diagnosis Date  . Chronic diarrhea   . Rectal bleed   . Hemorrhoids   . Ulcerative colitis     Distal UC over 8 yrs ago diagnosed  . Diverticulitis of large intestine with perforation 10/2011    done at [place]   Past Surgical History:  Past Surgical History  Procedure Laterality Date  . Colonoscopy  9/03  . Sigmoidoscopy       06/13/2002  . Temporary ostomy November 06, 2010      for a colon perforation that was done in [facility] (Dr  Payton Doughty).    . Colonoscopy  02/16/2011    Procedure: COLONOSCOPY;  Surgeon: Rogene Houston, MD;   [facility];  Service: Endoscopy;  Laterality: N/A;   100  . Flexible sigmoidoscopy  02/27/2012    Procedure: FLEXIBLE SIGMOIDOSCOPY;  Surgeon: Rogene Houston,  MD;  [facility];  Service: Endoscopy;  Laterality:  N/A;  with colonic decompression  . Laparotomy  02/27/2012    Procedure: EXPLORATORY LAPAROTOMY;  Surgeon: Donato Heinz,  MD;  [facility];  Service: General;  Laterality: N/A;  . Cecostomy  02/27/2012    Procedure: CECOSTOMY;  Surgeon: Donato Heinz, MD;  [facility];  Service: General;  Laterality: N/A;  Cecostomy Tube  Placement  . Colectomy  02/28/2012    Procedure: TOTAL COLECTOMY;  Surgeon: Donato Heinz, MD;   [facility];  Service: General;  Laterality: N/A;  . Ileostomy  02/28/2012    Procedure: ILEOSTOMY;  Surgeon: Donato Heinz, MD;  [facility];  Service: General;  Laterality: N/A;  . Cystoscopy w/ ureteral stent placement Bilateral 03/20/2012    Procedure: CYSTOSCOPY WITH RETROGRADE PYELOGRAM/URETERAL STENT  PLACEMENT;  Surgeon:  Alexis Frock, MD;  Location: [place];   Service: Urology;  Laterality: Bilateral;  . Laparotomy N/A 03/26/2012    Procedure: EXPLORATORY LAPAROTOMY;  Surgeon: Edward Jolly, MD;  Location: [place];  Service: General;  Laterality:  N/A;  . Application of wound vac N/A 03/26/2012    Procedure: APPLICATION OF WOUND VAC;  Surgeon: Edward Jolly, MD;  [facility];  Service: General;  Laterality:  N/A;  . Liver biopsy N/A 03/26/2012    Procedure: LIVER BIOPSY;  Surgeon: Edward Jolly, MD;   Location: [place];  Service: General;  Laterality: N/A;  . Laparotomy N/A 03/29/2012    Procedure: EXPLORATORY LAPAROTOMY, PARTIAL WOUND CLOSURE;   Surgeon: Edward Jolly, MD;  Location: [place];  Service:  CHEST - 1 VIEW  Comparison: 04/09/2012  Findings: 0523 hours.  Lung volumes are low.  Left base collapse / consolidation with effusion again noted.  NG tube has been removed in the interval and the tip of the feeding tube now overlies the mid stomach.  Left IJ central line tip projects over the proximal SVC region. Telemetry leads overlie the chest.  IMPRESSION: Interval removal of NG tube with placement of feeding tube with tip projecting over the mid stomach.  Stable cardiopulmonary exam.   Original Report Authenticated By: Connor Small, M.D.    Dg Swallowing Func-speech Pathology  04/17/2012  Connor Small, CCC-SLP     04/17/2012 11:12 AM Objective Swallowing Evaluation: Modified Barium Swallowing Study   Patient Details  Name: Connor Small MRN: [id number] Date of Birth: November 25, 1960  Today's Date: 04/17/2012 Time: 0940-1010 SLP Time Calculation (min): 30 min  Past Medical History:  Past Medical History  Diagnosis Date  . Chronic diarrhea   . Rectal bleed   . Hemorrhoids   . Ulcerative colitis     Distal UC over 8 yrs ago diagnosed  . Diverticulitis of large intestine with perforation 10/2011    done at [place]   Past Surgical History:  Past Surgical History  Procedure Laterality Date  . Colonoscopy  9/03  . Sigmoidoscopy       06/13/2002  . Temporary ostomy November 06, 2010      for a colon perforation that was done in [facility] (Dr  Payton Doughty).    . Colonoscopy  02/16/2011    Procedure: COLONOSCOPY;  Surgeon: Rogene Houston, MD;   [facility];  Service: Endoscopy;  Laterality: N/A;   100  . Flexible sigmoidoscopy  02/27/2012    Procedure: FLEXIBLE SIGMOIDOSCOPY;  Surgeon: Rogene Houston,  MD;  [facility];  Service: Endoscopy;  Laterality:  N/A;  with colonic decompression  . Laparotomy  02/27/2012    Procedure: EXPLORATORY LAPAROTOMY;  Surgeon: Donato Heinz,  MD;  [facility];  Service: General;  Laterality: N/A;  . Cecostomy  02/27/2012    Procedure: CECOSTOMY;  Surgeon: Donato Heinz, MD;  [facility];  Service: General;  Laterality: N/A;  Cecostomy Tube  Placement  . Colectomy  02/28/2012    Procedure: TOTAL COLECTOMY;  Surgeon: Donato Heinz, MD;   [facility];  Service: General;  Laterality: N/A;  . Ileostomy  02/28/2012    Procedure: ILEOSTOMY;  Surgeon: Donato Heinz, MD;  [facility];  Service: General;  Laterality: N/A;  . Cystoscopy w/ ureteral stent placement Bilateral 03/20/2012    Procedure: CYSTOSCOPY WITH RETROGRADE PYELOGRAM/URETERAL STENT  PLACEMENT;  Surgeon:  Alexis Frock, MD;  Location: [place];   Service: Urology;  Laterality: Bilateral;  . Laparotomy N/A 03/26/2012    Procedure: EXPLORATORY LAPAROTOMY;  Surgeon: Edward Jolly, MD;  Location: [place];  Service: General;  Laterality:  N/A;  . Application of wound vac N/A 03/26/2012    Procedure: APPLICATION OF WOUND VAC;  Surgeon: Edward Jolly, MD;  [facility];  Service: General;  Laterality:  N/A;  . Liver biopsy N/A 03/26/2012    Procedure: LIVER BIOPSY;  Surgeon: Edward Jolly, MD;   Location: [place];  Service: General;  Laterality: N/A;  . Laparotomy N/A 03/29/2012    Procedure: EXPLORATORY LAPAROTOMY, PARTIAL WOUND CLOSURE;   Surgeon: Edward Jolly, MD;  Location: [place];  Service:  PULMONARY  / CRITICAL CARE MEDICINE  Name: Connor Small MRN: [id number] DOB: 1960/04/26    ADMISSION DATE:  02/26/2012 CONSULTATION DATE:  2/5  REFERRING MD :  Anastasio Champion  CHIEF COMPLAINT:  Acute resp failure   BRIEF PATIENT DESCRIPTION:  [name] admitted to [place] for Cdiff on 2/2. Underwent decompressive cecostomy 2/3, initially was better, then worsened on 2/4 with increased abdominal distension and tenderness, decreased renal function.  He was taken back to the OR on 2/4 for total abdominal colectomy and end ileostomy.  Post op he developed ARDS and septic shock and was being treated for this.  The morning of 2/6 he had new onset of A-fib and transferred to [facility].   Course complicated by severe hypoalbuminemia, elevated LFTs  -cholestatic without biliary obstruction & pre-renal failure. Hypotensive 3/2 am requiring albumin  LINES / TUBES: ETT ([facility]) 2/4>>> 2/20 PICC ([facility]) 2/4>>>2/11 Art Line ([facility]) 2/5>>>out L IJ CVL 2/06 >> out A line R fem 2/18>>>2/24 Rt ij HD 2/17 >> out Left IJ 2/18 >>  ETT 3/3>>>out post op x 2  CULTURES: BCx2 2/4>>>negative BCx2 2/3>>>negative UC 2/3- Negative MRSA PCR 2/3- Negative C diff 2/6>>>Positive Body fluid 2/9>>>WBCs, no organisms 2/15 BC x 2 >> NEG 2/15 Sputum >>>NF 3/2 bc>>>NGTD 3/2 urine>>>neg 3/2 sputum>>>neg 3/5 cdiff>>>neg  PATH: 3/3 liver bx>>>BENIGN LIVER WITH CHOLESTASIS, The biopsy findings are that of extrahepatic biliary obstruction. No noted primary sclerosing cholangitis.  ANTIBIOTICS: Flagyl 2/3>> d/c'd Vanc 2/6 (IV) >>>2/7 azactam 2/6>>>2/7 Oral vanc 2/6>>>off IV vanc 2/16 >2/24 Aztreonam 2/16 >2/17 Imipenem 2/17>>>2/24 mycofungin 2/18>>>2/24  cipro 3/3>>>3/6 azactam 3/2>>>3/3 vanc 3/2>>>3/5 Flagyl 3/3>>>3/5   SIGNIFICANT EVENTS / STUDIES:  2/2 Admit to [facility] with C diff, toxic megacolon 2/3 Decompressive Cecostomy 2/4 Total abdominal colectomy and end ileostomy 2/6 New A-fib and transfer to [facility]. 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 2/10 CT abd/pelvis: small hematoma likely subcapsular spleen, JPs wnl 2/11 trial TFs  2/11 UE venous dopplers: Findings consistent with superficial vein thrombosis involving the right basilic vein 9/56 LE venous dopplers: No evidence of deep vein thrombosis 2/14 LUE venous doppler: No evidence of deep vein or superficial thrombosis involving the left upper extremity  2/15 neg 4 l bal on lasix drip, lasix d/c  2/16 poor neuro status, CT head: [facility] 2/17 CRRT initiated, high pressor needs 2/17 RUQ [place]: Gallbladder wall thickening in the absence of sludge or stones but no biliary ductal dilatation 2/18 reduced vasopressor reqts 2/19 New onset thrombocytopenia. Heparin D/C'd. Argatroban initiated 2/19 [place] abd (repeat): No explanation for elevated liver function tests 2/20 reduction pressors, improved alertness, extubated 2/20 LE venous dopplers: No evidence of deep vein or superficial thrombosis 2/21 bleeding overnight from JP's, argatroban turned off, lower pressor needs 2/22 off pressors, on RA, converted to NSR 2/22 Carotid dopplers: No significant extracranial carotid artery stenosis demonstrated 2/25 Cystoscopy with bilateral retrograde pyelograms. No extravasation of contrast from either ureteral orifice. No evidence of hydroureteronephrosis. Unremarkable urinary bladder. B ureteral stents placed 2/27 MRI brain: Scattered small acute to subacute infarcts in both cerebral hemispheres, brainstem, and right cerebellum. Favor sequelae of emboli from a cardiac or proximal aortic source. Main alternate consideration is hypotensive episode 2/27 MRCP: No intrahepatic bile duct dilatation or common bile duct dilatation. No gallstones identified. Large subcapsular fluid collection encases the spleen. This is of  indeterminate etiology.  without attempt by patient to eject  out (silent aspiration) Pharyngeal - Thin Pharyngeal - Ice Chips: Not tested Pharyngeal - Thin Teaspoon: Reduced pharyngeal  peristalsis;Reduced epiglottic inversion;Reduced anterior  laryngeal mobility;Reduced laryngeal elevation;Reduced  airway/laryngeal closure;Reduced tongue base  retraction;Penetration/Aspiration during  swallow;Penetration/Aspiration after swallow;Pharyngeal residue -  valleculae;Pharyngeal residue - pyriform sinuses Penetration/Aspiration details (thin teaspoon): Material enters  airway, remains ABOVE vocal cords and not ejected out;Material  enters airway, CONTACTS cords and not ejected out;Material does  not enter airway Pharyngeal - Thin Cup: Reduced pharyngeal peristalsis;Reduced  epiglottic inversion;Reduced anterior laryngeal mobility;Reduced  laryngeal elevation;Reduced airway/laryngeal closure;Reduced  tongue base retraction;Penetration/Aspiration during  swallow;Penetration/Aspiration after swallow;Pharyngeal residue -  valleculae;Pharyngeal residue - pyriform sinuses Penetration/Aspiration details (thin cup): Material enters  airway, CONTACTS cords and not ejected out Pharyngeal - Solids Pharyngeal - Puree: Reduced  pharyngeal peristalsis;Reduced  epiglottic inversion;Reduced anterior laryngeal mobility;Reduced  laryngeal elevation;Reduced airway/laryngeal closure;Reduced  tongue base retraction;Penetration/Aspiration after  swallow;Pharyngeal residue - valleculae;Pharyngeal residue -  pyriform sinuses Penetration/Aspiration details (puree): Material does not enter  airway;Material enters airway, remains ABOVE vocal cords and not  ejected out Pharyngeal - Mechanical Soft: Not tested  Cervical Esophageal Phase    GO    Cervical Esophageal Phase Cervical Esophageal Phase: Impaired Cervical Esophageal Phase - Comment Cervical Esophageal Comment: Appearance of mild protrusion of  cervical spine at level of UES impedes complete passage to bolus  with moderate residuals pooling in pyriform sinuses post swallow.          [name], MA CCC-SLP [phone]  [name] 04/17/2012, 11:06 AM     Imaging x 48 hours Dg Chest [facility]  04/16/2012  *RADIOLOGY REPORT*  Clinical Data: Hypoventilation.  PORTABLE CHEST - 1 VIEW  Comparison: 04/09/2012  Findings: 0523 hours.  Lung volumes are low.  Left base collapse / consolidation with effusion again noted.  NG tube has been removed in the interval and the tip of the feeding tube now overlies the mid stomach.  Left IJ central line tip projects over the proximal SVC region. Telemetry leads overlie the chest.  IMPRESSION: Interval removal of NG tube with placement of feeding tube with tip projecting over the mid stomach.  Stable cardiopulmonary exam.   Original Report Authenticated By: Connor Small, M.D.    Dg Swallowing Func-speech Pathology  04/17/2012  Connor Small, CCC-SLP     04/17/2012 11:12 AM Objective Swallowing Evaluation: Modified Barium Swallowing Study   Patient Details  Name: Connor Small MRN: [id number] Date of Birth: 1960/03/10  Today's Date: 04/17/2012 Time: 0940-1010 SLP Time Calculation (min): 30 min  Past Medical History:  Past Medical History   Diagnosis Date  . Chronic diarrhea   . Rectal bleed   . Hemorrhoids   . Ulcerative colitis     Distal UC over 8 yrs ago diagnosed  . Diverticulitis of large intestine with perforation 10/2011    done at [place]   Past Surgical History:  Past Surgical History  Procedure Laterality Date  . Colonoscopy  9/03  . Sigmoidoscopy       06/13/2002  . Temporary ostomy November 06, 2010      for a colon perforation that was done in [facility] (Dr  Payton Doughty).    . Colonoscopy  02/16/2011    Procedure: COLONOSCOPY;  Surgeon: Rogene Houston, MD;   [facility];  Service: Endoscopy;  Laterality: N/A;   100  . Flexible sigmoidoscopy  02/27/2012    Procedure: FLEXIBLE SIGMOIDOSCOPY;  without attempt by patient to eject  out (silent aspiration) Pharyngeal - Thin Pharyngeal - Ice Chips: Not tested Pharyngeal - Thin Teaspoon: Reduced pharyngeal  peristalsis;Reduced epiglottic inversion;Reduced anterior  laryngeal mobility;Reduced laryngeal elevation;Reduced  airway/laryngeal closure;Reduced tongue base  retraction;Penetration/Aspiration during  swallow;Penetration/Aspiration after swallow;Pharyngeal residue -  valleculae;Pharyngeal residue - pyriform sinuses Penetration/Aspiration details (thin teaspoon): Material enters  airway, remains ABOVE vocal cords and not ejected out;Material  enters airway, CONTACTS cords and not ejected out;Material does  not enter airway Pharyngeal - Thin Cup: Reduced pharyngeal peristalsis;Reduced  epiglottic inversion;Reduced anterior laryngeal mobility;Reduced  laryngeal elevation;Reduced airway/laryngeal closure;Reduced  tongue base retraction;Penetration/Aspiration during  swallow;Penetration/Aspiration after swallow;Pharyngeal residue -  valleculae;Pharyngeal residue - pyriform sinuses Penetration/Aspiration details (thin cup): Material enters  airway, CONTACTS cords and not ejected out Pharyngeal - Solids Pharyngeal - Puree: Reduced  pharyngeal peristalsis;Reduced  epiglottic inversion;Reduced anterior laryngeal mobility;Reduced  laryngeal elevation;Reduced airway/laryngeal closure;Reduced  tongue base retraction;Penetration/Aspiration after  swallow;Pharyngeal residue - valleculae;Pharyngeal residue -  pyriform sinuses Penetration/Aspiration details (puree): Material does not enter  airway;Material enters airway, remains ABOVE vocal cords and not  ejected out Pharyngeal - Mechanical Soft: Not tested  Cervical Esophageal Phase    GO    Cervical Esophageal Phase Cervical Esophageal Phase: Impaired Cervical Esophageal Phase - Comment Cervical Esophageal Comment: Appearance of mild protrusion of  cervical spine at level of UES impedes complete passage to bolus  with moderate residuals pooling in pyriform sinuses post swallow.          [name], MA CCC-SLP [phone]  [name] 04/17/2012, 11:06 AM     Imaging x 48 hours Dg Chest [facility]  04/16/2012  *RADIOLOGY REPORT*  Clinical Data: Hypoventilation.  PORTABLE CHEST - 1 VIEW  Comparison: 04/09/2012  Findings: 0523 hours.  Lung volumes are low.  Left base collapse / consolidation with effusion again noted.  NG tube has been removed in the interval and the tip of the feeding tube now overlies the mid stomach.  Left IJ central line tip projects over the proximal SVC region. Telemetry leads overlie the chest.  IMPRESSION: Interval removal of NG tube with placement of feeding tube with tip projecting over the mid stomach.  Stable cardiopulmonary exam.   Original Report Authenticated By: Connor Small, M.D.    Dg Swallowing Func-speech Pathology  04/17/2012  Connor Small, CCC-SLP     04/17/2012 11:12 AM Objective Swallowing Evaluation: Modified Barium Swallowing Study   Patient Details  Name: Connor Small MRN: [id number] Date of Birth: 1960/03/10  Today's Date: 04/17/2012 Time: 0940-1010 SLP Time Calculation (min): 30 min  Past Medical History:  Past Medical History   Diagnosis Date  . Chronic diarrhea   . Rectal bleed   . Hemorrhoids   . Ulcerative colitis     Distal UC over 8 yrs ago diagnosed  . Diverticulitis of large intestine with perforation 10/2011    done at [place]   Past Surgical History:  Past Surgical History  Procedure Laterality Date  . Colonoscopy  9/03  . Sigmoidoscopy       06/13/2002  . Temporary ostomy November 06, 2010      for a colon perforation that was done in [facility] (Dr  Payton Doughty).    . Colonoscopy  02/16/2011    Procedure: COLONOSCOPY;  Surgeon: Rogene Houston, MD;   [facility];  Service: Endoscopy;  Laterality: N/A;   100  . Flexible sigmoidoscopy  02/27/2012    Procedure: FLEXIBLE SIGMOIDOSCOPY;  without attempt by patient to eject  out (silent aspiration) Pharyngeal - Thin Pharyngeal - Ice Chips: Not tested Pharyngeal - Thin Teaspoon: Reduced pharyngeal  peristalsis;Reduced epiglottic inversion;Reduced anterior  laryngeal mobility;Reduced laryngeal elevation;Reduced  airway/laryngeal closure;Reduced tongue base  retraction;Penetration/Aspiration during  swallow;Penetration/Aspiration after swallow;Pharyngeal residue -  valleculae;Pharyngeal residue - pyriform sinuses Penetration/Aspiration details (thin teaspoon): Material enters  airway, remains ABOVE vocal cords and not ejected out;Material  enters airway, CONTACTS cords and not ejected out;Material does  not enter airway Pharyngeal - Thin Cup: Reduced pharyngeal peristalsis;Reduced  epiglottic inversion;Reduced anterior laryngeal mobility;Reduced  laryngeal elevation;Reduced airway/laryngeal closure;Reduced  tongue base retraction;Penetration/Aspiration during  swallow;Penetration/Aspiration after swallow;Pharyngeal residue -  valleculae;Pharyngeal residue - pyriform sinuses Penetration/Aspiration details (thin cup): Material enters  airway, CONTACTS cords and not ejected out Pharyngeal - Solids Pharyngeal - Puree: Reduced  pharyngeal peristalsis;Reduced  epiglottic inversion;Reduced anterior laryngeal mobility;Reduced  laryngeal elevation;Reduced airway/laryngeal closure;Reduced  tongue base retraction;Penetration/Aspiration after  swallow;Pharyngeal residue - valleculae;Pharyngeal residue -  pyriform sinuses Penetration/Aspiration details (puree): Material does not enter  airway;Material enters airway, remains ABOVE vocal cords and not  ejected out Pharyngeal - Mechanical Soft: Not tested  Cervical Esophageal Phase    GO    Cervical Esophageal Phase Cervical Esophageal Phase: Impaired Cervical Esophageal Phase - Comment Cervical Esophageal Comment: Appearance of mild protrusion of  cervical spine at level of UES impedes complete passage to bolus  with moderate residuals pooling in pyriform sinuses post swallow.          [name], MA CCC-SLP [phone]  [name] 04/17/2012, 11:06 AM     Imaging x 48 hours Dg Chest [facility]  04/16/2012  *RADIOLOGY REPORT*  Clinical Data: Hypoventilation.  PORTABLE CHEST - 1 VIEW  Comparison: 04/09/2012  Findings: 0523 hours.  Lung volumes are low.  Left base collapse / consolidation with effusion again noted.  NG tube has been removed in the interval and the tip of the feeding tube now overlies the mid stomach.  Left IJ central line tip projects over the proximal SVC region. Telemetry leads overlie the chest.  IMPRESSION: Interval removal of NG tube with placement of feeding tube with tip projecting over the mid stomach.  Stable cardiopulmonary exam.   Original Report Authenticated By: Connor Small, M.D.    Dg Swallowing Func-speech Pathology  04/17/2012  Connor Small, CCC-SLP     04/17/2012 11:12 AM Objective Swallowing Evaluation: Modified Barium Swallowing Study   Patient Details  Name: Connor Small MRN: [id number] Date of Birth: 1960/03/10  Today's Date: 04/17/2012 Time: 0940-1010 SLP Time Calculation (min): 30 min  Past Medical History:  Past Medical History   Diagnosis Date  . Chronic diarrhea   . Rectal bleed   . Hemorrhoids   . Ulcerative colitis     Distal UC over 8 yrs ago diagnosed  . Diverticulitis of large intestine with perforation 10/2011    done at [place]   Past Surgical History:  Past Surgical History  Procedure Laterality Date  . Colonoscopy  9/03  . Sigmoidoscopy       06/13/2002  . Temporary ostomy November 06, 2010      for a colon perforation that was done in [facility] (Dr  Payton Doughty).    . Colonoscopy  02/16/2011    Procedure: COLONOSCOPY;  Surgeon: Rogene Houston, MD;   [facility];  Service: Endoscopy;  Laterality: N/A;   100  . Flexible sigmoidoscopy  02/27/2012    Procedure: FLEXIBLE SIGMOIDOSCOPY;

## 2012-04-17 NOTE — Progress Notes (Signed)
I have insurance approval and will plan admit to ip rehab today. [phone]

## 2012-04-17 NOTE — Discharge Summary (Signed)
Staff note  I personally supervise his discharge. He issues 1 empiric treatment with Diflucan for oral and esophageal candida . Second is that he is on methadone that was titrated up was in 04/16/2012. Inpatient rehabilitation should monitor his EKG QT interval every 2 weeks or so. Also titration of methadone should be upward or downward only every 5 days given his prolonged half-life  Greater than 30 minutes in discharge planning as per the nurse practitioner    Dr. Brand Males, M.D., [facility].C.P Pulmonary and Critical Care Medicine Staff Physician [facility] Pager: [phone], If no answer or between  15:00h - 7:00h: call [phone]  04/17/2012 6:18 PM

## 2012-04-18 ENCOUNTER — Inpatient Hospital Stay (HOSPITAL_COMMUNITY): Payer: BC Managed Care – PPO | Admitting: Speech Pathology

## 2012-04-18 ENCOUNTER — Inpatient Hospital Stay (HOSPITAL_COMMUNITY): Payer: BC Managed Care – PPO | Admitting: Occupational Therapy

## 2012-04-18 ENCOUNTER — Inpatient Hospital Stay (HOSPITAL_COMMUNITY): Payer: BC Managed Care – PPO | Admitting: Physical Therapy

## 2012-04-18 DIAGNOSIS — G7281 Critical illness myopathy: Secondary | ICD-10-CM

## 2012-04-18 DIAGNOSIS — D62 Acute posthemorrhagic anemia: Secondary | ICD-10-CM | POA: Diagnosis present

## 2012-04-18 DIAGNOSIS — M79609 Pain in unspecified limb: Secondary | ICD-10-CM

## 2012-04-18 HISTORY — DX: Critical illness myopathy: G72.81

## 2012-04-18 LAB — COMPREHENSIVE METABOLIC PANEL
ALT: 39 U/L (ref 0–53)
AST: 27 U/L (ref 0–37)
CO2: 21 mEq/L (ref 19–32)
Chloride: 126 mEq/L — ABNORMAL HIGH (ref 96–112)
GFR calc non Af Amer: 64 mL/min — ABNORMAL LOW (ref >90)
Sodium: 157 mEq/L — ABNORMAL HIGH (ref 135–145)
Total Bilirubin: 1.5 mg/dL — ABNORMAL HIGH (ref 0.3–1.2)

## 2012-04-18 LAB — CBC WITH DIFFERENTIAL/PLATELET
Basophils Absolute: 0 10*3/uL (ref 0.0–0.1)
HCT: 24 % — ABNORMAL LOW (ref 39.0–52.0)
Lymphocytes Relative: 10 % — ABNORMAL LOW (ref 12–46)
Neutro Abs: 7.7 10*3/uL (ref 1.7–7.7)
Neutrophils Relative %: 81 % — ABNORMAL HIGH (ref 43–77)
Platelets: 275 10*3/uL (ref 150–400)
RDW: 19.1 % — ABNORMAL HIGH (ref 11.5–15.5)
WBC: 9.4 10*3/uL (ref 4.0–10.5)

## 2012-04-18 LAB — RETICULOCYTES
RBC.: 2.3 MIL/uL — ABNORMAL LOW (ref 4.22–5.81)
Retic Count, Absolute: 32.2 10*3/uL (ref 19.0–186.0)
Retic Ct Pct: 1.4 % (ref 0.4–3.1)

## 2012-04-18 LAB — VITAMIN B12: Vitamin B-12: 336 pg/mL (ref 211–911)

## 2012-04-18 LAB — IRON AND TIBC: Iron: 17 ug/dL — ABNORMAL LOW (ref 42–135)

## 2012-04-18 MED ORDER — DEXTROSE 5 % IV SOLN
INTRAVENOUS | Status: DC
  Administered 2012-04-18 – 2012-04-19 (×2): via INTRAVENOUS

## 2012-04-18 MED ORDER — ADULT MULTIVITAMIN W/MINERALS CH
1.0000 | ORAL_TABLET | Freq: Every day | ORAL | Status: DC
  Administered 2012-04-18: 1 via ORAL
  Filled 2012-04-18 (×3): qty 1

## 2012-04-18 MED ORDER — METHADONE HCL 5 MG PO TABS
5.0000 mg | ORAL_TABLET | Freq: Three times a day (TID) | ORAL | Status: DC
  Administered 2012-04-18 – 2012-04-19 (×3): 5 mg via ORAL
  Filled 2012-04-18 (×3): qty 1

## 2012-04-18 MED ORDER — PRO-STAT SUGAR FREE PO LIQD
30.0000 mL | Freq: Two times a day (BID) | ORAL | Status: DC
  Administered 2012-04-18 – 2012-04-19 (×4): 30 mL via ORAL
  Filled 2012-04-18 (×6): qty 30

## 2012-04-18 MED ORDER — TRAZODONE HCL 50 MG PO TABS
50.0000 mg | ORAL_TABLET | Freq: Every day | ORAL | Status: DC
  Administered 2012-04-18 – 2012-04-22 (×5): 50 mg via ORAL
  Filled 2012-04-18 (×5): qty 1

## 2012-04-18 MED ORDER — ENSURE COMPLETE PO LIQD: 237.0000 mL | ORAL | Status: DC | PRN

## 2012-04-18 MED ORDER — BOOST / RESOURCE BREEZE PO LIQD
1.0000 | Freq: Two times a day (BID) | ORAL | Status: DC
  Administered 2012-04-18: 1 via ORAL

## 2012-04-18 MED ORDER — ASPIRIN EC 325 MG PO TBEC
325.0000 mg | DELAYED_RELEASE_TABLET | Freq: Every day | ORAL | Status: DC
  Filled 2012-04-18 (×2): qty 1

## 2012-04-18 MED ORDER — SUCRALFATE 1 GM/10ML PO SUSP
1.0000 g | Freq: Three times a day (TID) | ORAL | Status: DC
  Administered 2012-04-18 – 2012-04-20 (×9): 1 g via ORAL
  Filled 2012-04-18 (×12): qty 10

## 2012-04-18 NOTE — Progress Notes (Signed)
INITIAL NUTRITION ASSESSMENT  DOCUMENTATION CODES Per approved criteria  -Severe malnutrition in the context of acute illness or injury   INTERVENTION: 1. MVI daily 2. Resource Breeze po BID, each supplement provides 250 kcal and 9 grams of protein. 3. Ensure Complete po prn (for med pass), each supplement provides 350 kcal and 13 grams of protein. 4. 30 ml Prostat po BID, each supplement provides 100 kcal and 15 grams protein. 5. Discussed oral intake extensively with wife at bedside 6. RD to continue to follow nutrition care plan  NUTRITION DIAGNOSIS: Inadequate oral intake related to swallowing difficulty as evidenced by dietary recall and ongoing weight loss.   Goal: Intake to meet >90% of estimated nutrition needs.  Monitor:  weight trends, lab trends, I/O's, PO intake, supplement tolerance  Reason for Assessment: Health History  52 y.o. male  Admitting Dx: Critical illness myopathy  ASSESSMENT: Hx of ulcerative colitis and diverticular perforation. Admitted to [facility] on 2/2 with low back pain, fever, and multiple episodes of watery diarrhea. Work-up revealed multiple pseudomembranes consistent with C. difficile colitis and low colorectal stricture. Underwent exploratory laparotomy with cecostomy tube placement 2/3. 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 Post-op he developed ARDS with septic shock as well as renal failure due to AKI. On 2/6 he developed new onset a-fib with RVR and was transferred to [facility]. Renal consulted for metabolic acidosis with anasarca and non-oliguric ARF likely due to ischemic ATN in setting of hypotension. CRRT initiated. He was started on stress dose steroids and was treated with broad spectrum antibiotics. Wound evisceration treated with placement of retention sutures and wound closure. MRI brain when stable and EMG recommended for workup of critical illness myopathy  v/s neuropathy. Ct cervical spine with degenerative changes and large left pleural effusion. 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 He developed hypotension with GIB requiring multiple units PRBC on 3/10. He continues with high output from ileostomy with hypernatremia. Panda discontinued on 3/24; f/u MBS done yesterday with recommendations of Dysphagia 3 diet with thin liquids by tsp. He has had oral discomfort and was started on diflucan for thrush. Pain control remains an issue.  Pt followed by RD staff during acute hospitalization. Pt with variable wt since admission. Admission wt 200 lbs, wife reports that this is patient's baseline weight. Pt underwent permissive underfeeding x 3 weeks as appropriate for critically ill obese pt and initiated advancement to full feeds (2/21-2/26). Feeds were held for surgery (2/26-3/3), resumed with pt reaching goal rate (3/4). TFs again held (3/11) for GI bleed, resumed (3/12). Pt was dx with severe malnutrition of acute illness by RD 2 days ago given 17.5% wt loss in >1 month and PO/nutrition support meeting </=75% of estimated needs for >1 month. Continues to meet criteria for this at this time.  Discussed oral intake with wife. Encouraged [employer], yogurt, [employer], and Prostat. Discussed high calorie and protein needs for healing and therapy. Wife verbalized understanding and is very involved in patient's care.   Height: Ht Readings from Last 1 Encounters:  04/17/12 5' 10"  (1.778 m)    Weight: Wt Readings from Last 1 Encounters:  04/17/12 168 lb 14 oz (76.6 kg)    Ideal Body Weight: 166 lb  % Ideal Body Weight: 101%  Wt Readings from Last 10 Encounters:  04/17/12 168 lb 14 oz (76.6 kg)  04/17/12 168 lb 14 oz (76.6 kg)  04/17/12 168 lb 14 oz (76.6 kg)  04/17/12 168 lb 14 oz (76.6 kg)  04/17/12 168 lb 14 oz (76.6 kg)  04/17/12 168  lb 14 oz (76.6 kg)  04/17/12 168 lb 14 oz (76.6 kg)  04/17/12 168 lb 14 oz (76.6 kg)  01/16/12 204 lb 1.6 oz (92.579 kg)  07/18/11 198 lb 11.2 oz (90.13 kg)    Usual Body Weight: 200 lb  % Usual Body Weight: 84%  BMI:  24.2 - WNL  Estimated Nutritional Needs: Kcal: 2200 - 2500 kcal Protein: 140 - 160 grams Fluid: at least 2.3 liters daily  Skin: stage II R arm, stage I sacrum  Diet Order: Dysphagia 3 with thin liquids  EDUCATION NEEDS: -No education needs identified at this time   Intake/Output Summary (Last 24 hours) at 04/18/12 1044 Last data filed at 04/18/12 0939  Gross per 24 hour  Intake      0 ml  Output    425 ml  Net   -425 ml    Last BM: 3/24  Labs:   Recent Labs Lab 04/12/12 0435 04/13/12 0410 04/16/12 0549 04/18/12 0525  NA 155* 154* 155* 157*  K 4.3 3.3* 3.5 3.4*  CL 122* 121* 124* 126*  CO2 23 23 22 21   BUN 58* 56* 45* 36*  CREATININE 1.37* 1.28 1.25 1.25  CALCIUM 8.5 8.4 8.5 8.4  MG 1.8 2.3  --   --   PHOS 3.0 3.3  --   --   GLUCOSE 94 121* 124* 108*    CBG (last 3)  No results found for this basename: GLUCAP,  in the last 72 hours  Scheduled Meds: . chlorhexidine  15 mL Mouth Rinse BID  . dextrose   Intravenous Custom  . fluconazole  100 mg Oral Daily  . lidocaine  1 patch Transdermal Q24H  . methadone  5 mg Oral Q8H  . pantoprazole  40 mg Oral BID AC  . saccharomyces boulardii  250 mg Oral BID  . sucralfate  1 g Oral TID WC & HS  . traZODone  50 mg Oral QHS    Continuous Infusions:   Past Medical History  Diagnosis Date  . Chronic diarrhea   . Rectal bleed   . Hemorrhoids   . Ulcerative colitis     Distal UC over 8 yrs ago diagnosed  . Diverticulitis of large intestine with perforation 10/2011    done at [place]    Past Surgical History  Procedure Laterality Date  . Colonoscopy  9/03  . Sigmoidoscopy       06/13/2002  . Temporary ostomy November 06, 2010      for a colon perforation that was done in [facility] (Dr Payton Doughty).    . Colonoscopy  02/16/2011    Procedure: COLONOSCOPY;  Surgeon: Rogene Houston, MD;  [facility];  Service: Endoscopy;  Laterality: N/A;  100  . Flexible sigmoidoscopy  02/27/2012    Procedure: FLEXIBLE SIGMOIDOSCOPY;  Surgeon: Rogene Houston, MD;  [facility];  Service: Endoscopy;  Laterality: N/A;  with colonic decompression  . Laparotomy  02/27/2012    Procedure: EXPLORATORY LAPAROTOMY;  Surgeon: Donato Heinz, MD;  [facility];  Service: General;  Laterality: N/A;  . Cecostomy  02/27/2012    Procedure: CECOSTOMY;  Surgeon: Donato Heinz, MD;  [facility];  Service: General;  Laterality: N/A;  Cecostomy Tube Placement  .  Colectomy  02/28/2012    Procedure: TOTAL COLECTOMY;  Surgeon: Donato Heinz, MD;  [facility];  Service: General;  Laterality: N/A;  . Ileostomy  02/28/2012    Procedure: ILEOSTOMY;  Surgeon: Donato Heinz, MD;  [facility];  Service: General;  Laterality: N/A;  . Cystoscopy w/ ureteral stent placement Bilateral 03/20/2012    Procedure: CYSTOSCOPY WITH RETROGRADE PYELOGRAM/URETERAL STENT PLACEMENT;  Surgeon: Alexis Frock, MD;  Location: [place];  Service: Urology;  Laterality: Bilateral;  . Laparotomy N/A 03/26/2012    Procedure: EXPLORATORY LAPAROTOMY;  Surgeon: Edward Jolly, MD;  Location: [place];  Service: General;  Laterality: N/A;  . Application of wound vac N/A 03/26/2012    Procedure: APPLICATION OF WOUND VAC;  Surgeon: Edward Jolly, MD;  [facility];  Service: General;  Laterality: N/A;  . Liver biopsy N/A 03/26/2012    Procedure: LIVER BIOPSY;  Surgeon: Edward Jolly, MD;  Location: [place];  Service: General;  Laterality: N/A;  . Laparotomy N/A 03/29/2012    Procedure: EXPLORATORY LAPAROTOMY, PARTIAL WOUND CLOSURE;  Surgeon: Edward Jolly, MD;  Location: [place];  Service: General;  Laterality: N/A;  . Vacuum assisted closure change N/A 03/29/2012    Procedure: Open ABDOMINAL VACUUM   CHANGE;  Surgeon: Edward Jolly, MD;  Location: [place];  Service: General;  Laterality: N/A;  . Vacuum assisted closure change N/A 04/01/2012    Procedure: removal of abdominal vac dressing and abdominal closure;  Surgeon: Edward Jolly, MD;  Location: [place];  Service: General;  Laterality: N/A;    Inda Coke MS, RD, LDN Pager: [phone] After-hours pager: [phone]

## 2012-04-18 NOTE — Progress Notes (Signed)
Social Work Assessment and Plan Social Work Assessment and Plan  Patient Details  Name: Connor Small MRN: [id number] Date of Birth: [birth year]  Today's Date: 04/18/2012  Problem List:  Patient Active Problem List  Diagnosis  . history of UC (ulcerative colitis)  . C. difficile colitis  . Colonic stricture  . S/P cecostomy  . ARF (acute renal failure)  . Sinus tachycardia  . Hematuria  . Acute respiratory failure, resolved  . Septic shock, resolved  . Pleural effusion, L > R  . Leukocytosis, unspecified  . Thrombocytopenia, unspecified  . Hyperbilirubinemia, due to cholestasis  . Atrial fibrillation, resolved  . Anemia, mild. No evidence of active blood loss. No indication for PRBCs  . Quadriparesis  . S/p total colectomy for toxic megacolon, C diff  . Hypernatremia, improving  . Severe muscle deconditioning  . Shoulder subluxation, right  . 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 Coagulopathy  . Critical illness myopathy and neuropathy  . Acute blood loss anemia   Past Medical History:  Past Medical History  Diagnosis Date  . Chronic diarrhea   . Rectal bleed   . Hemorrhoids   . Ulcerative colitis     Distal UC over 8 yrs ago diagnosed  . Diverticulitis of large intestine with perforation 10/2011    done at [place]   Past Surgical History:  Past Surgical History  Procedure Laterality Date  . Colonoscopy  9/03  . Sigmoidoscopy       06/13/2002  . Temporary ostomy November 06, 2010      for a colon perforation that was done in [facility] (Dr Ruben Im).    . Colonoscopy  02/16/2011    Procedure: COLONOSCOPY;  Surgeon: Malissa Hippo, MD;  [facility];  Service: Endoscopy;  Laterality: N/A;  100  . Flexible sigmoidoscopy  02/27/2012    Procedure: FLEXIBLE SIGMOIDOSCOPY;  Surgeon: Malissa Hippo, MD;  [facility];  Service: Endoscopy;  Laterality: N/A;  with colonic decompression  . Laparotomy  02/27/2012    Procedure:  EXPLORATORY LAPAROTOMY;  Surgeon: Fabio Bering, MD;  [facility];  Service: General;  Laterality: N/A;  . Cecostomy  02/27/2012    Procedure: CECOSTOMY;  Surgeon: Fabio Bering, MD;  [facility];  Service: General;  Laterality: N/A;  Cecostomy Tube Placement  . Colectomy  02/28/2012    Procedure: TOTAL COLECTOMY;  Surgeon: Fabio Bering, MD;  [facility];  Service: General;  Laterality: N/A;  . Ileostomy  02/28/2012    Procedure: ILEOSTOMY;  Surgeon: Fabio Bering, MD;  [facility];  Service: General;  Laterality: N/A;  . Cystoscopy w/ ureteral stent placement Bilateral 03/20/2012    Procedure: CYSTOSCOPY WITH RETROGRADE PYELOGRAM/URETERAL STENT PLACEMENT;  Surgeon: Sebastian Ache, MD;  Location: [facility] OR;  Service: Urology;  Laterality: Bilateral;  . Laparotomy N/A 03/26/2012    Procedure: EXPLORATORY LAPAROTOMY;  Surgeon: Mariella Saa, MD;  [facility];  Service: General;  Laterality: N/A;  . Application of wound vac N/A 03/26/2012    Procedure: APPLICATION OF WOUND VAC;  Surgeon: Mariella Saa, MD;  [facility];  Service: General;  Laterality: N/A;  . Liver biopsy N/A 03/26/2012    Procedure: LIVER BIOPSY;  Surgeon: Mariella Saa, MD;  [facility];  Service: General;  Laterality: N/A;  . Laparotomy N/A 03/29/2012    Procedure: EXPLORATORY LAPAROTOMY, PARTIAL WOUND CLOSURE;  Surgeon: Mariella Saa, MD;  [facility];  Service: General;  Laterality: N/A;  . Vacuum assisted closure change N/A 03/29/2012    Procedure: Open ABDOMINAL VACUUM  CHANGE;  Surgeon: Mariella Saa, MD;  [facility];  Service: General;  Laterality: N/A;  . Vacuum assisted closure change N/A 04/01/2012    Procedure: removal of abdominal vac dressing and abdominal closure;  Surgeon: Mariella Saa, MD;  [facility];  Service: General;  Laterality: N/A;   Social History:  reports that he quit smoking about 27 years ago. His smoking use included Cigarettes. He  smoked 0.00 packs per day. He has never used smokeless tobacco. He reports that he does not drink alcohol or use illicit drugs.  Family / Support Systems Marital Status: Married Patient Roles: Spouse;Parent;Other (Comment) (Employee) Spouse/Significant Other: [name]  [phone]-home  [phone]-cell Children: [place]-daughter  [phone] Other Supports: Brother Anticipated Caregiver: Wife and daughter Ability/Limitations of Caregiver: Wife currently on FMLA from her school and doe snot plan to go back this school year.  Daughter to take some time to assist Mom with Dad. Caregiver Availability: 24/7 Family Dynamics: Close knit small family who are supportive and involved with one another.  Wife has been staying here with pt since he was admitted.    Social History Preferred language: English Religion:  Cultural Background: No issues Education: High School Read: Yes Write: Yes Employment Status: Employed Name of Employer: [employer] Return to Work Plans: Unsure at this time Fish farm manager Issues: No issues Guardian/Conservator: None-according to MD pt is not capable of making his own decisions.  Will rely upon wife to make any decisions while here.   Abuse/Neglect Physical Abuse: Denies Verbal Abuse: Denies Sexual Abuse: Denies Exploitation of patient/patient's resources: Denies Self-Neglect: Denies  Emotional Status Pt's affect, behavior adn adjustment status: Pt does speak some, he wants to go home and glad to be talking about this is the last step before going home.  Wife is very involved and participates in pt's care.  Wife reports: " He has always been so independent and wants to be again." Recent Psychosocial Issues: Medical issues from dental procedure which all started this Pyschiatric History: No history-deferred depression screen due to pt;s cognitive deficts and tireness from therapies.  Will have [facility]-psych eval when appropriate and provide support to  wife while here. Substance Abuse History: No issues  Patient / Family Perceptions, Expectations & Goals Pt/Family understanding of illness & functional limitations: Wife is able to explain pt's multiple medical procedures and treatment plan.  She has been staying here with him and never has left.  Pt listens and responds at times but allows wife to answer for him.  Will continue to assess pt's understanding. Premorbid pt/family roles/activities: Father, Husband, Employee, Home owner, Mining engineer, etc Anticipated changes in roles/activities/participation: resume Pt/family expectations/goals: Pt states: ' I want to get better and go home."  Wife states: " I am hopeful he can get back as much as he can to be as independent as possible."  She states: " I will do whatever I can to assist him."  [employer]: None Premorbid Home Care/DME Agencies: None Transportation available at discharge: family Resource referrals recommended: Support group (specify) ([facility] Support group)  Discharge Planning Living Arrangements: Spouse/significant other Support Systems: Spouse/significant other;Children;Other relatives;Friends/neighbors;Church/faith community Type of Residence: Private residence Insurance Resources: Media planner (specify) Herbalist) Financial Resources: Employment;Family Support;Other (Comment) (STD now thru [employer]) Financial Screen Referred: No Living Expenses: Lives with family Money Management: Patient;Spouse Do you have any  problems obtaining your medications?: No Home Management: Wife Patient/Family Preliminary Plans: Return home with wife providing assistance.  Daughter to assist also once goes home.  All are hopeful he will do well here and recover as much as he can. Social Work Anticipated Follow Up Needs: HH/OP;Support Group  Clinical Impression Pleasant couple, wife realistic regarding pt's care and has been here since he was admitted.  Daughter to  assist also, very close family.  Pt to be seen by Neuro-psych went appropriate.  Lucy Chris 04/18/2012, 3:13 PM

## 2012-04-18 NOTE — Progress Notes (Addendum)
Patient ID: Connor Small, male   DOB: [birth year], 52 y.o.   MRN: [id number] 52 y.o. right-handed male with history of ulcerative colitis as well as prior diverticular perforation, who was admitted to [facility] on 02/26/2012 with low back pain, fever 102.5 and multiple episodes of watery diarrhea. Underwent flexible sigmoidoscopy 02/27/2012 per Dr.Rehman with findings of multiple pseudomembranes consistent with C. difficile colitis as well as low colorectal stricture. Underwent exploratory laparotomy,cecostomy tube placement per Dr. Dian Situ 02/27/2012. 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 Post op he developed ARDS with septic shock as well as renal failure due to AKI. On 02/06 he developed new onset Atrial fibrillation with RVR and was transferred to [facility] for treatment. Dr. Lorie Phenix consulted for input on metabolic acidosis with anasarca and non-oliguric ARF likely due to ischemic ATN in setting of hypotension and CRRT initiated. He was started on stress dose steroids and was treated with broad spectrum antibiotics. Dr. Derrell Lolling consulted for input and with close monitoring of wound. Wound evisceration treated with placement of retention sutures and wound closure on 3/9 by Dr. Johna Sheriff.  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 Neurology consulted for input global weakness and proximal RUE weakness. 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 Ct cervical spine with degenerative changes and large left pleural effusion. Dr. Kathrynn Running consulted for possible ureteral injury due to high output from JP drain and cysto with pyelogram revealed no filling defects or extravasation. Dr. Madilyn Fireman consulted for input on abnormal LFTs. 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 Dr. Pearlean Brownie recommended ASA for now and anticoagulation once thrombocytopenia/LFTs resolve and patient able to swallow safely. Dr Ophelia Charter consulted due to right shoulder pain and inferior subluxation on xray. He felt inferior position due to RUE weakness and not dislocation He developed hypotension with GIB requiring multiple units PRBC on 3/10. He continues with high output from ileostomy with hypernatremia. Panda discontinued on 03/24 And follow up MBS done today due to dysphagia. Diet advanced to D3, thin liquids by tsp. He has had oral discomfort and was started on diflucan for thrush. Pain control remains an issue was placed on methadone.  Subjective/Complaints:   Objective: Vital Signs: Blood pressure 102/70, pulse 103, temperature 98.2 F (36.8 C), temperature source Oral, resp. rate 20, height 5\' 10"  (1.778 m), SpO2 96.00%.     Results for orders placed during the hospital encounter of 04/17/12 (from the past 72 hour(s))  RETICULOCYTES     Status: Abnormal   Collection Time    04/18/12  5:25 AM      Result Value Range   Retic Ct Pct 1.4  0.4 - 3.1 %   RBC. 2.30 (*) 4.22 - 5.81 MIL/uL   Retic Count, Manual 32.2  19.0 - 186.0 K/uL  CBC WITH DIFFERENTIAL     Status: Abnormal   Collection Time    04/18/12  5:25 AM      Result Value Range   WBC 9.4  4.0 - 10.5 K/uL   RBC 2.30 (*) 4.22 - 5.81 MIL/uL   Hemoglobin 7.2 (*) 13.0 - 17.0  g/dL   HCT 16.1 (*) 09.6 - 04.5 %   MCV 104.3 (*) 78.0 - 100.0 fL   MCH 31.3  26.0 - 34.0 pg   MCHC 30.0  30.0 - 36.0 g/dL   RDW 40.9 (*) 81.1 - 91.4 %   Platelets 275  150 - 400 K/uL   Neutrophils Relative 81 (*) 43 - 77 %   Neutro Abs 7.7  1.7 - 7.7 K/uL   Lymphocytes Relative 10 (*) 12 - 46 %   Lymphs Abs  1.0  0.7 - 4.0 K/uL   Monocytes Relative 7  3 - 12 %   Monocytes Absolute 0.6  0.1 - 1.0 K/uL   Eosinophils Relative 1  0 - 5 %   Eosinophils Absolute 0.1  0.0 - 0.7 K/uL   Basophils Relative 0  0 - 1 %   Basophils Absolute 0.0  0.0 - 0.1 K/uL  COMPREHENSIVE METABOLIC PANEL     Status: Abnormal   Collection Time    04/18/12  5:25 AM      Result Value Range   Sodium 157 (*) 135 - 145 mEq/L   Potassium 3.4 (*) 3.5 - 5.1 mEq/L   Chloride 126 (*) 96 - 112 mEq/L   CO2 21  19 - 32 mEq/L   Glucose, Bld 108 (*) 70 - 99 mg/dL   BUN 36 (*) 6 - 23 mg/dL   Creatinine, Ser 7.82  0.50 - 1.35 mg/dL   Calcium 8.4  8.4 - 95.6 mg/dL   Total Protein 5.3 (*) 6.0 - 8.3 g/dL   Albumin 1.5 (*) 3.5 - 5.2 g/dL   AST 27  0 - 37 U/L   ALT 39  0 - 53 U/L   Alkaline Phosphatase 341 (*) 39 - 117 U/L   Total Bilirubin 1.5 (*) 0.3 - 1.2 mg/dL   GFR calc non Af Amer 64 (*) >90 mL/min   GFR calc Af Amer 74 (*) >90 mL/min   Comment:            The eGFR has been calculated     using the CKD EPI equation.     This calculation has not been     validated in all clinical     situations.     eGFR's persistently     <90 mL/min signify     possible Chronic Kidney Disease.     HEENT: thrush Cardio: RRR and tachy Resp: CTA B/L and unlabored GI: BS positive and Non distended Extremity:  No Edema Skin:   Wound RLQ ileostomy with liquid output, ostomy site ok, Mild line retention sutures with Wet/dry 4/4 packing Neuro: Alert/Oriented, Anxious, Cranial Nerve II-XII normal, Normal Sensory and Abnormal Motor L delt bi,tri, grip 4-/5, R delt 1/5, R bi 0/5, R tri 3/5, R wrist ext 0/5, R hand intrinsics 0/5 abd, 3/5 add,2-/5 B HF, KE, 0/5 ankle DF, 3-/5 ankle eversion and PF Musc/Skel:  Other No pain with PROM of UE or LEs Gen:  anxious no acute distress 3+bilateral knee and 1+ bilateral ankle DTRs  Assessment/Plan: 1. Functional deficits secondary to  critical illness neuropathy/myopathy and bicerebral embolic  infarcts  which require 3+ hours per day of interdisciplinary therapy in a comprehensive inpatient rehab setting. Physiatrist is providing close team supervision and 24 hour management of active medical problems listed below. Physiatrist and rehab team continue to assess barriers to discharge/monitor patient progress toward functional and medical goals. FIM:  Comprehension Comprehension Mode: Auditory Comprehension: 4-Understands basic 75 - 89% of the time/requires cueing 10 - 24% of the time  Expression Expression Mode: Verbal Expression: 4-Expresses basic 75 - 89% of the time/requires cueing 10 - 24% of the time. Needs helper to occlude trach/needs to repeat words.  Social Interaction Social Interaction Mode: Asleep Social Interaction: 5-Interacts appropriately 90% of the time - Needs monitoring or encouragement for participation or interaction.  Problem Solving Problem Solving: 5-Solves basic 90% of the time/requires cueing < 10% of the time  Memory Memory: 5-Recognizes or recalls 90% of the time/requires cueing < 10% of the time   Medical Problem List and Plan:  1. DVT Prophylaxis/Anticoagulation: Mechanical: Sequential compression devices, below knee Bilateral lower extremities thrombocytopenia resolved. 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 GIB 03/10.  -dopplers performed 3/7 were negative  2. Multifactorial pain: Panda now out so should help decrease reflux/panda discomfort. Abdominal distention/ discomfort continues.  3. Mood: Distracted due to multiple medical issues. Underlying cognitive issues also clouding the picture. Will need a lot of ego support by the team. Neuropsychologist and LCSW to follow additionally for evaluation and support once patient gets into a routine.  4. Neuropsych: This patient is not capable of making decisions on his/her own behalf.  5. Hypernatremia: due to high volume losses. Will add HS hydration as doubt he'll  be able to maintain adequate hydration on liquids by tsp. Change IVF to D5W.  6. Bilateral cardioembolic CVA: no blood thinners due to issues with bleeding?  7. ABLA: will continue to monitor H/H and transfuse for hgb <7.0. Currently ranging from 7.6- 8.4 range. Will check anemia panel. MCV 102. May benefit from aranesp.  8. Critical illness neuropathy/myopathy-  Protect heels, stretch heel cords  -AFO's at some point when mobility justifies them 9.  Toxic megacolon s/p total colectomy.  Has high output ileostomy 10.Marland Kitchen Hypernatremia and hyperchloremia due to high output ileostomy  LOS (Days) 1 A FACE TO FACE EVALUATION WAS PERFORMED  Connor Small 04/18/2012, 9:10 AM

## 2012-04-18 NOTE — Care Management Note (Signed)
[facility] Individual Statement of Services  Patient Name:  Connor Small  Date:  04/18/2012  Welcome to the [facility].  Our goal is to provide you with an individualized program based on your diagnosis and situation, designed to meet your specific needs.  With this comprehensive rehabilitation program, you will be expected to participate in at least 3 hours of rehabilitation therapies Monday-Friday, with modified therapy programming on the weekends.  Your rehabilitation program will include the following services:  Physical Therapy (PT), Occupational Therapy (OT), Speech Therapy (ST), 24 hour per day rehabilitation nursing, Neuropsychology, Case Management ( Social Worker), Rehabilitation Medicine, Nutrition Services and Pharmacy Services  Weekly team conferences will be held on Wednesday to discuss your progress.  Your  Social Worker will talk with you frequently to get your input and to update you on team discussions.  Team conferences with you and your family in attendance may also be held.  Expected length of stay: 6 weeks  Overall anticipated outcome: min level  Depending on your progress and recovery, your program may change.  Your  Social Worker will coordinate services and will keep you informed of any changes.  Your  SW name and contact number are listed  below.  The following services may also be recommended but are not provided by the [facility] will be made to provide these services after discharge if needed.  Arrangements include referral to agencies that provide these services.  Your insurance has been verified to be:  [place] Your primary doctor is:  None  Pertinent information will be shared with your doctor and your insurance company.   Social Worker:  Ovidio Kin,  [place]  Information discussed with and copy given to patient by: Elease Hashimoto, 04/18/2012, 3:15 PM

## 2012-04-18 NOTE — Progress Notes (Signed)
VASCULAR LAB PRELIMINARY  PRELIMINARY  PRELIMINARY  PRELIMINARY  Bilateral lower extremity venous duplex completed.    Preliminary report:  Bilateral:  No evidence of DVT, superficial thrombosis, or Baker's Cyst. No change from previous studies with 03/30/2012 being the most recent.   Ameen Mostafa, [place], RVS 04/18/2012, 6:55 PM

## 2012-04-18 NOTE — Evaluation (Signed)
Occupational Therapy Assessment and Plan And Treatment Session Notes  Patient Details  Name: Connor Small MRN: [id number] Date of Birth: 05/16/1960  OT Diagnosis: abnormal posture, acute pain, cognitive deficits, muscular wasting and disuse atrophy, muscle weakness (generalized), pain in joint and quadriparesis. Rehab Potential: Rehab Potential: Fair ELOS: 5 weeks  Today's Date: 04/18/2012 Time: 0800-0900 and 1150-1210 Time Calculation (min): 60 min and 20 min  Problem List:  Patient Active Problem List  Diagnosis  . history of UC (ulcerative colitis)  . C. difficile colitis  . Colonic stricture  . S/P cecostomy  . ARF (acute renal failure)  . Sinus tachycardia  . Hematuria  . Acute respiratory failure, resolved  . Septic shock, resolved  . Pleural effusion, L > R  . Leukocytosis, unspecified  . Thrombocytopenia, unspecified  . Hyperbilirubinemia, due to cholestasis  . Atrial fibrillation, resolved  . Anemia, mild. No evidence of active blood loss. No indication for PRBCs  . Quadriparesis  . S/p total colectomy for toxic megacolon, C diff  . Hypernatremia, improving  . Severe muscle deconditioning  . Shoulder subluxation, right  . 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 Coagulopathy  . Critical illness myopathy and neuropathy  . Acute blood loss anemia    Past Medical History:  Past Medical History  Diagnosis Date  . Chronic diarrhea   . Rectal bleed   . Hemorrhoids   . Ulcerative colitis     Distal UC over 8 yrs ago diagnosed  . Diverticulitis of large intestine with perforation 10/2011    done at [facility]   Past Surgical History:  Past Surgical History  Procedure Laterality Date  . Colonoscopy  9/03  . Sigmoidoscopy       06/13/2002  . Temporary ostomy November 06, 2010      for a colon perforation that was done in [facility] (Dr Ruben Im).    . Colonoscopy  02/16/2011    Procedure: COLONOSCOPY;  Surgeon: Malissa Hippo, MD;  Location: [facility]  ENDO SU[facility];  Service: Endoscopy;  Laterality: N/A;  100  . Flexible sigmoidoscopy  02/27/2012    Procedure: FLEXIBLE SIGMOIDOSCOPY;  Surgeon: Malissa Hippo, MD;  Location: [facility] ENDO [facility];  Service: Endoscopy;  Laterality: N/A;  with colonic decompression  . Laparotomy  02/27/2012    Procedure: EXPLORATORY LAPAROTOMY;  Surgeon: Fabio Bering, MD;  Location: [facility] ORS;  Service: General;  Laterality: N/A;  . Cecostomy  02/27/2012    Procedure: CECOSTOMY;  Surgeon: Fabio Bering, MD;  Location: [facility] ORS;  Service: General;  Laterality: N/A;  Cecostomy Tube Placement  . Colectomy  02/28/2012    Procedure: TOTAL COLECTOMY;  Surgeon: Fabio Bering, MD;  Location: [facility] ORS;  Service: General;  Laterality: N/A;  . Ileostomy  02/28/2012    Procedure: ILEOSTOMY;  Surgeon: Fabio Bering, MD;  Location: [facility] ORS;  Service: General;  Laterality: N/A;  . Cystoscopy w/ ureteral stent placement Bilateral 03/20/2012    Procedure: CYSTOSCOPY WITH RETROGRADE PYELOGRAM/URETERAL STENT PLACEMENT;  Surgeon: Sebastian Ache, MD;  Location: [facility] OR;  Service: Urology;  Laterality: Bilateral;  . Laparotomy N/A 03/26/2012    Procedure: EXPLORATORY LAPAROTOMY;  Surgeon: Mariella Saa, MD;  Location: [facility] OR;  Service: General;  Laterality: N/A;  . Application of wound vac N/A 03/26/2012    Procedure: APPLICATION OF WOUND VAC;  Surgeon: Mariella Saa, MD;  Location: [facility] OR;  Service: General;  Laterality: N/A;  . Liver biopsy N/A 03/26/2012  Procedure: LIVER BIOPSY;  Surgeon: Mariella Saa, MD;  Location: [facility] OR;  Service: General;  Laterality: N/A;  . Laparotomy N/A 03/29/2012    Procedure: EXPLORATORY LAPAROTOMY, PARTIAL WOUND CLOSURE;  Surgeon: Mariella Saa, MD;  Location: [facility] OR;  Service: General;  Laterality: N/A;  . Vacuum assisted closure change N/A 03/29/2012    Procedure: Open ABDOMINAL VACUUM  CHANGE;  Surgeon: Mariella Saa, MD;  Location: [facility] OR;  Service: General;  Laterality: N/A;  . Vacuum assisted  closure change N/A 04/01/2012    Procedure: removal of abdominal vac dressing and abdominal closure;  Surgeon: Mariella Saa, MD;  Location: [facility] OR;  Service: General;  Laterality: N/A;    Assessment & Plan Clinical Impression: Clinical Impression: Patient is a 52 y.o. right-handed male with history of ulcerative colitis as well as prior diverticular perforation, who was admitted to [facility] on 02/26/2012 with low back pain, fever 102.5 and multiple episodes of watery diarrhea. Underwent flexible sigmoidoscopy 02/27/2012 per Dr.Rehman with findings of multiple pseudomembranes consistent with C. difficile colitis as well as low colorectal stricture. Underwent exploratory laparotomy,cecostomy tube placement per Dr. Dian Situ 02/27/2012. 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 Post op he developed ARDS with septic shock as well as renal failure due to AKI. On 02/06 he developed new onset Atrial fibrillation with RVR and was transferred to [facility] for treatment. Dr. Lorie Phenix consulted for input on metabolic acidosis with anasarca and non-oliguric ARF likely due to ischemic ATN in setting of hypotension and CRRT initiated. He was started on stress dose steroids and was treated with broad spectrum antibiotics. Dr. Derrell Lolling consulted for input and with close monitoring of wound. Wound evisceration treated with placement of retention sutures and wound closure on 3/9 by Dr. Johna Sheriff. 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 Neurology consulted for input global weakness and proximal RUE weakness. 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 Ct cervical spine with degenerative changes and large left pleural effusion. Dr. Kathrynn Running  consulted for possible ureteral injury due to high output from JP drain and cysto with pyelogram revealed no filling defects or extravasation. Dr. Madilyn Fireman consulted for input on abnormal LFTs. 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 Dr. Pearlean Brownie recommended ASA for now and anticoagulation once thrombocytopenia/LFTs resolve and patient able to swallow safely. Dr Ophelia Charter consulted due to right shoulder pain and inferior subluxation on xray. He felt inferior position due to RUE weakness and not dislocation He developed hypotension with GIB requiring multiple units PRBC on 3/10. He continues with high output from ileostomy with hypernatremia. Panda discontinued on 03/24 And follow up MBS done today due to dysphagia. Diet advanced to D3, thin liquids by tsp. He has had oral discomfort and was started on diflucan for thrush. Pain control remains an issue was placed on methadone. IV narcotics were discontinued yesterday. He's severely deconditioned and working on truncal and head control in sitting at EOB. Noted to have tachycardia with activity.  Patient transferred to CIR on 04/17/2012 .   Patient currently requires total with basic self-care skills secondary to muscle weakness  and muscle joint tightness, decreased cardiorespiratoy endurance, decreased initiation, decreased attention, decreased problem solving, decreased memory and delayed processing and decreased sitting balance, decreased postural control and decreased balance strategies.  Prior to hospitalization, patient could complete BADL & IADL independently and was working.  Patient will benefit from skilled intervention to decrease level of assist with basic self-care skills prior to discharge home with care partner.  Anticipate patient will require 24  hour supervision, occasional min-moderate physical assstance and follow up home health.  OT - End of Session Endurance Deficit: Yes Endurance Deficit Description: extremely deconditioned, limited also by pain and lengthy hospitalization promotes dependency OT Assessment Rehab Potential: Fair OT Plan OT Intensity: Minimum of 1-2 x/day, 45 to 90 minutes OT Frequency: Total of 15 hours over 7 days OT Treatment/Interventions: Balance/vestibular training;Cognitive remediation/compensation;DME/adaptive equipment instruction;Discharge planning;Neuromuscular re-education;Functional mobility training;Pain management;Patient/family education;Psychosocial support;Splinting/orthotics;Skin care/wound managment;Self Care/advanced ADL retraining;Therapeutic Activities;Therapeutic Exercise;UE/LE Strength taining/ROM;UE/LE Coordination activities;Visual/perceptual remediation/compensation;Wheelchair propulsion/positioning OT Recommendation Recommendations for Other Services: Neuropsych consult Patient destination: Home Follow Up Recommendations: Home health OT Equipment Details: TBA  Skilled Therapeutic Intervention 1)  OT eval, grooming tasks with HOB up, wife delivered clothes at end of session, patient very fatigued after simple grooming tasks and assessment of BUEs.  Reviewed OT goals and rehab process to include review of expectations while on rehab.  Patient and wife anxious about rehab however after discussion both seemed more relieved.   2)  Plan to assess and assist with lunch however, patient's lunch arrived late and this did not occur.  Patient up in w/c stating that he was uncomfortable therefore assisted to improve comfort in hopes to stay up long enough to eat lunch however patient with need to urinate so assisted wife to position patient to include lateral leans to remove pants so she could place urinal.  Patient with difficulty urinating by end of session.  OT  Evaluation Precautions/Restrictions  Precautions Precautions: Shoulder;Fall Type of Shoulder Precautions: R shoulder subluxation Precaution Comments: Abd binder to protect incision, ostomy, tachycardia, Afib Required Braces or Orthoses: Other Brace/Splint Other Brace/Splint: R wrist cock up slint Pain Denies pain except during end ranges of BUEs during PROM Home Living/Prior Functioning Home Living Lives With: Spouse Available Help at Discharge: Family;Available 24 hours/day Type of Home: House Home Access: Stairs to enter [employer] of Steps: 1 step onto porch and one step into house Entrance Stairs-Rails: None Home Layout: One level Bathroom Shower/Tub: [employer] manager Accessibility: Yes How Accessible: Accessible via walker Additional Comments: wife fmla, family close by and will help; his brother Prior Function Level of Independence: Independent with gait;Independent with transfers Able to Take Stairs?: Yes Driving: Yes Vocation: Full time employment 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 Vision/Perception  Vision - History Baseline Vision: No visual deficits Vision - Assessment Vision Assessment: Vision not tested Additional Comments: to be further assessed in functional context  Cognition Overall Cognitive Status: Impaired Arousal/Alertness: Awake/alert Orientation Level: Oriented to person;Oriented to place Attention: Sustained;Selective Sustained Attention: Impaired Sustained Attention Impairment: Verbal basic;Functional basic Selective Attention: Impaired Selective Attention Impairment: Verbal basic;Functional basic Memory: Impaired Memory Impairment: Storage deficit;Retrieval deficit;Decreased recall of new information Awareness: Impaired Problem Solving: Impaired Behaviors: Restless Safety/Judgment: Appears intact Comments: patient with inconsistent awareness of deficits; underestimates abilities  Motor  Motor Motor:  Tetraplegia;Abnormal tone;Motor apraxia;Abnormal postural alignment and control;Motor impersistence Motor - Skilled Clinical Observations: Tetraplegia with R > L weakness, motor impersistence, impaired motor planning and initiation, sits with significant neck and trunk flexion Mobility  Bed  Mobility Bed Mobility: Sit to Supine;Supine to Sit Supine to Sit: 1: +2 Total assist;With rails;HOB flat Sit to Supine: 1: +2 Total assist Transfers Sit to Stand: From bed;1: +2 Total assist;From elevated surface;With upper extremity assist  Trunk/Postural Assessment  Cervical Assessment Cervical Assessment: Exceptions to [facility] (significant flexion and R lateral flexion) Thoracic Assessment Thoracic Assessment: Exceptions to [facility] (kyphotic) Lumbar Assessment Lumbar Assessment: Exceptions to [facility] (posterior pelvic tilt) Postural Control Postural Control: Deficits on evaluation (Sits with R lateral lean and trunk flexion)  Balance Static Sitting Balance Static Sitting - Balance Support: Bilateral upper extremity supported;Feet supported Static Sitting - Level of Assistance: 4: Min assist Dynamic Sitting Balance Dynamic Sitting - Balance Support: Right upper extremity supported;Left upper extremity supported;Feet supported Dynamic Sitting - Level of Assistance: 2: Max assist;1: +1 Total assist Dynamic Sitting - Balance Activities: Lateral lean/weight shifting;Forward lean/weight shifting Extremity Assessment: RUE Exceptions to [facility] (right is dominant) Overall AROM Right Upper Extremity: Due to pain;Deficits RUE Overall AROM Comments: AROM noted more distal than proximal, movement noted in elbow extension, pronation, wrist flex, minimal finger and thumb flex and ext movement. Overall PROM Right Upper Extremity: Deficits;Due to pain;Due to precautions (Inferior GH subluxation) RUE Overall PROM Comments: Pain noted in end ranges for all UE movements.  Shoulder PROM achieved only with mobilization of scapula.   Fingers and thumb with increased pain during passive flexion. RUE Overall Strength: Deficits RUE Overall Strength Comments: Overall 0/5-1/5 shoulder, 1/5-2/5 distal to shoulder. LUE Assessment LUE Assessment: Exceptions to WFL LUE AROM (degrees) Overall AROM Left Upper Extremity: Deficits;Due to pain LUE Overall AROM Comments: ZOX:WRUEA, forarm, wrist, fingers (except end range of first finger), and thumb. Minimal shoulder movements (not yet functional) LUE PROM (degrees) Overall PROM Left Upper Extremity: Deficits;Due to pain LUE Overall PROM Comments: WFL except end ranges of shoulder due to pain. LUE Strength LUE Overall Strength: Deficits LUE Overall Strength Comments: Shoulder 1/5-3/5, Distal to shoulder grossly 4-/5  FIM:  FIM - Eating TBA FIM - Grooming Grooming Steps: Wash, rinse, dry face;Oral care, brush teeth, clean dentures Grooming: 2: Patient completes 1 of 4 or 2 of 5 steps FIM - Bathing Bathing Steps Patient Completed: Chest Bathing: 1: Two helpers FIM - Upper Body Dressing/Undressing Upper body dressing/undressing: 0: Wears gown/pajamas-no public clothing FIM - Lower Body Dressing/Undressing Lower body dressing/undressing: 0: Activity did not occur FIM - Toileting Toileting: 1: Total-Patient completed zero steps, helper did all 3 FIM - Tub/Shower Transfers Tub/shower Transfers: 0-Activity did not occur or was simulated (n/a at this time)  FIM - Banker Devices: Sliding board;Bed rails;Orthosis Bed/Chair Transfer: 1: Supine > Sit: Total A (helper does all/Pt. < 25%);1: Sit > Supine: Total A (helper does all/Pt. < 25%);1: Bed > Chair or W/C: Total A (helper does all/Pt. < 25%);1: Chair or W/C > Bed: Total A (helper does all/Pt. < 25%);1: Two helpers FIM - Locomotion: Wheelchair Distance: 150 in tilt in space w/c for positioning, pressure relief with head support and elevating LR to assist with BP control; full lap tray also  placed for UE support Locomotion: Wheelchair: 1: Total Assistance/staff pushes wheelchair (Pt<25%)  Refer to Care Plan for Long Term Goals  Recommendations for other services: Neuropsych  Discharge Criteria: Patient will be discharged from OT if patient refuses treatment 3 consecutive times without medical reason, if treatment goals not met, if there is a change in medical status, if patient makes no progress towards goals or if patient is discharged from  hospital.  The above assessment, treatment plan, treatment alternatives and goals were discussed and mutually agreed upon: by patient and by family  Nikea Settle 04/18/2012, 3:59 PM

## 2012-04-18 NOTE — Evaluation (Signed)
Speech Language Pathology Assessment and Plan  Patient Details  Name: Connor Small MRN: [id number] Date of Birth: 07-30-1960  SLP Diagnosis: Dysarthria;Dysphagia;Cognitive Impairments  Rehab Potential: Good ELOS: 6 weeks   Today's Date: 04/18/2012 Time: 1324-4010 Time Calculation (min): 30 min  Problem List:  Patient Active Problem List  Diagnosis  . history of UC (ulcerative colitis)  . C. difficile colitis  . Colonic stricture  . S/P cecostomy  . ARF (acute renal failure)  . Sinus tachycardia  . Hematuria  . Acute respiratory failure, resolved  . Septic shock, resolved  . Pleural effusion, L > R  . Leukocytosis, unspecified  . Thrombocytopenia, unspecified  . Hyperbilirubinemia, due to cholestasis  . Atrial fibrillation, resolved  . Anemia, mild. No evidence of active blood loss. No indication for PRBCs  . Quadriparesis  . S/p total colectomy for toxic megacolon, C diff  . Hypernatremia, improving  . Severe muscle deconditioning  . Shoulder subluxation, right  . 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 Coagulopathy  . Critical illness myopathy and neuropathy  . Acute blood loss anemia   Past Medical History:  Past Medical History  Diagnosis Date  . Chronic diarrhea   . Rectal bleed   . Hemorrhoids   . Ulcerative colitis     Distal UC over 8 yrs ago diagnosed  . Diverticulitis of large intestine with perforation 10/2011    done at [facility]   Past Surgical History:  Past Surgical History  Procedure Laterality Date  . Colonoscopy  9/03  . Sigmoidoscopy       06/13/2002  . Temporary ostomy November 06, 2010      for a colon perforation that was done in [facility] (Dr Ruben Im).    . Colonoscopy  02/16/2011    Procedure: COLONOSCOPY;  Surgeon: Malissa Hippo, MD;  Location: [facility];  Service: Endoscopy;  Laterality: N/A;  100  . Flexible sigmoidoscopy  02/27/2012    Procedure: FLEXIBLE SIGMOIDOSCOPY;  Surgeon: Malissa Hippo, MD;   Location: [facility];  Service: Endoscopy;  Laterality: N/A;  with colonic decompression  . Laparotomy  02/27/2012    Procedure: EXPLORATORY LAPAROTOMY;  Surgeon: Fabio Bering, MD;  Location: [facility];  Service: General;  Laterality: N/A;  . Cecostomy  02/27/2012    Procedure: CECOSTOMY;  Surgeon: Fabio Bering, MD;  Location: [facility];  Service: General;  Laterality: N/A;  Cecostomy Tube Placement  . Colectomy  02/28/2012    Procedure: TOTAL COLECTOMY;  Surgeon: Fabio Bering, MD;  Location: [facility];  Service: General;  Laterality: N/A;  . Ileostomy  02/28/2012    Procedure: ILEOSTOMY;  Surgeon: Fabio Bering, MD;  Location: [facility];  Service: General;  Laterality: N/A;  . Cystoscopy w/ ureteral stent placement Bilateral 03/20/2012    Procedure: CYSTOSCOPY WITH RETROGRADE PYELOGRAM/URETERAL STENT PLACEMENT;  Surgeon: Sebastian Ache, MD;  Location[facility];  Service: Urology;  Laterality: Bilateral;  . Laparotomy N/A 03/26/2012    Procedure: EXPLORATORY LAPAROTOMY;  Surgeon: Mariella Saa, MD;  Location: [facility] OR;  Service: General;  Laterality: N/A;  . Application of wound vac N/A 03/26/2012    Procedure: APPLICATION OF WOUND VAC;  Surgeon: Mariella Saa, MD;  Location: [facility] OR;  Service: General;  Laterality: N/A;  . Liver biopsy N/A 03/26/2012    Procedure: LIVER BIOPSY;  Surgeon: Mariella Saa, MD;  Location: [facility] OR;  Service: General;  Laterality: N/A;  . Laparotomy N/A 03/29/2012  Procedure: EXPLORATORY LAPAROTOMY, PARTIAL WOUND CLOSURE;  Surgeon: Mariella Saa, MD;  Location: [facility] OR;  Service: General;  Laterality: N/A;  . Vacuum assisted closure change N/A 03/29/2012    Procedure: Open ABDOMINAL VACUUM  CHANGE;  Surgeon: Mariella Saa, MD;  Location: [facility] OR;  Service: General;  Laterality: N/A;  . Vacuum assisted closure change N/A 04/01/2012    Procedure: removal of abdominal vac dressing and abdominal closure;  Surgeon: Mariella Saa, MD;  Location: [facility] OR;  Service:  General;  Laterality: N/A;    Assessment / Plan / Recommendation Clinical Impression  Connor Small is a 52 y.o. right-handed male with history of ulcerative colitis as well as prior diverticular perforation, who was admitted to [facility] on 02/26/2012 with low back pain, fever 102.5 and multiple episodes of watery diarrhea. Underwent flexible sigmoidoscopy 02/27/2012 per Dr.Rehman with findings of multiple pseudomembranes consistent with C. difficile colitis as well as low colorectal stricture. Underwent exploratory laparotomy,cecostomy tube placement per Dr. Dian Situ 02/27/2012. 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 Post op he developed ARDS with septic shock as well as renal failure due to AKI. On 02/06 he developed new onset Atrial fibrillation with RVR and was transferred to [facility] for treatment. Dr. Lorie Phenix consulted for input on metabolic acidosis with anasarca and non-oliguric ARF likely due to ischemic ATN in setting of hypotension and CRRT initiated. Neurology consulted for input global weakness and proximal RUE weakness. CT Head with right cerebellar and left frontoparietal infarcts. 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 Panda discontinued on 03/24 And follow up MBS done today due to dysphagia. Diet advanced to Dys.3 textures and thin liquids via teaspoon.  He has had oral discomfort and was started on diflucan for thrush. He's severely deconditioned and working on truncal and head control in sitting at EOB; noted to have tachycardia with activity. Therapy team recommended CIR and he was admitted 04/17/12 for therapy.    Cognitive-Linguistic evaluation revealed deficits characterized by decreased sustained  attention, which impact his ability to complete a basic task and recall information.  Patient also demonstrates decreased awareness of abilities by overstating deficits at times and resisting to attempt to do things for himself.  Bedside Swallow Evaluation revealed reduced pharyngeal strength and timeliness of swallow.  Per yesterday's object test patient was observed with Dys.3 textures and thin liquids via teaspoon with a throat clear and 1-2 additional swallows to reduce pharyngeal residue.  Patient required max assist to recall and utilize these strategies throughout lunch.  Occasionally patient would demonstrate a hard reflexive cough which SLP suspected was that result of penetration build up; cough appeared to remove suspected penetrates.  Recommend continuation of current dietand skilled SLP services during CIR stay to address dysphagia and cognition goals.  This will maximize functional independence and reduce burden of care upon discharge home with wife.      SLP Assessment  Patient will need skilled Speech Lanaguage Pathology Services during CIR admission    Recommendations  Diet Recommendations: Dysphagia 3 (Mechanical Soft);Thin liquid Liquid Administration via: Spoon Medication Administration: Crushed with puree Supervision: Patient able to self feed;Full supervision/cueing for compensatory strategies Compensations: Slow rate;Small sips/bites;Multiple dry swallows after each bite/sip;Follow solids with liquid;Clear throat intermittently;Effortful swallow Postural Changes and/or Swallow Maneuvers:  Seated upright 90 degrees;Upright 30-60 min after meal Oral Care Recommendations: Oral care BID Recommendations for Other Services: Neuropsych consult Patient destination: Home Follow up Recommendations: Home Health SLP;24 hour supervision/assistance Equipment Recommended: None recommended by SLP    SLP Frequency 5 out of 7 days   SLP Treatment/Interventions Cognitive  remediation/compensation;Cueing hierarchy;Dysphagia/aspiration precaution training;Environmental controls;Functional tasks;Internal/external aids;Oral motor exercises;Patient/family education;Speech/Language facilitation;Therapeutic Activities;Therapeutic Exercise    Pain  On going; RN aware and patient premedicated with pain patch.  SLP also repositioned during session.   Prior Functioning Cognitive/Linguistic Baseline: Within functional limits Type of Home: House Lives With: Spouse Available Help at Discharge: Family;Available 24 hours/day Vocation: Full time employment  Short Term Goals: Week 1: SLP Short Term Goal 1 (Week 1): Patient will recall and utilize safe swallow compensatory strategies with mod assist verbal cues. SLP Short Term Goal 2 (Week 1): Patient will complete oropharyngeal strengthening exercises x10 each with moderate verbal cues.   SLP Short Term Goal 3 (Week 1): Patient will sustian attention to basic task for 3 minutes with with mod assist verbal cues for re-direction.  SLP Short Term Goal 4 (Week 1): Patient will utilize external aids to recall daily information with mod assist verbal cues.  See FIM for current functional status Refer to Care Plan for Long Term Goals  Recommendations for other services: Neuropsych  Discharge Criteria: Patient will be discharged from SLP if patient refuses treatment 3 consecutive times without medical reason, if treatment goals not met, if there is a change in medical status, if patient makes no progress towards goals or if patient is discharged from hospital.  The above assessment, treatment plan, treatment alternatives and goals were discussed and mutually agreed upon: by patient  Fae Pippin, M.A., CCC-SLP [phone]  Raiyah Speakman 04/18/2012, 2:34 PM

## 2012-04-18 NOTE — Evaluation (Addendum)
Physical Therapy Assessment and Plan  Patient Details  Name: Connor Small MRN: [id number] Date of Birth: May 25, 1960  PT Diagnosis: Abnormal posture, Difficulty walking, Hypotonia, Impaired cognition, Impaired sensation, Muscle weakness, Pain in abdomen and Quadriplegia Rehab Potential: Good ELOS: 6 weeks   Today's Date: 04/18/2012 Time: 4098-1191 Time Calculation (min): 78 min  Problem List:  Patient Active Problem List  Diagnosis  . history of UC (ulcerative colitis)  . C. difficile colitis  . Colonic stricture  . S/P cecostomy  . ARF (acute renal failure)  . Sinus tachycardia  . Hematuria  . Acute respiratory failure, resolved  . Septic shock, resolved  . Pleural effusion, L > R  . Leukocytosis, unspecified  . Thrombocytopenia, unspecified  . Hyperbilirubinemia, due to cholestasis  . Atrial fibrillation, resolved  . Anemia, mild. No evidence of active blood loss. No indication for PRBCs  . Quadriparesis  . S/p total colectomy for toxic megacolon, C diff  . Hypernatremia, improving  . Severe muscle deconditioning  . Shoulder subluxation, right  . 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 Coagulopathy  . Critical illness myopathy and neuropathy  . Acute blood loss anemia    Past Medical History:  Past Medical History  Diagnosis Date  . Chronic diarrhea   . Rectal bleed   . Hemorrhoids   . Ulcerative colitis     Distal UC over 8 yrs ago diagnosed  . Diverticulitis of large intestine with perforation 10/2011    done at [facility]   Past Surgical History:  Past Surgical History  Procedure Laterality Date  . Colonoscopy  9/03  . Sigmoidoscopy       06/13/2002  . Temporary ostomy November 06, 2010      for a colon perforation that was done in [facility] (Dr Ruben Im).    . Colonoscopy  02/16/2011    Procedure: COLONOSCOPY;  Surgeon: Malissa Hippo, MD;  Location: [facility];  Service: Endoscopy;  Laterality: N/A;  100  . Flexible sigmoidoscopy   02/27/2012    Procedure: FLEXIBLE SIGMOIDOSCOPY;  Surgeon: Malissa Hippo, MD;  Location: [facility];  Service: Endoscopy;  Laterality: N/A;  with colonic decompression  . Laparotomy  02/27/2012    Procedure: EXPLORATORY LAPAROTOMY;  Surgeon: Fabio Bering, MD;  Location: [facility] ORS;  Service: General;  Laterality: N/A;  . Cecostomy  02/27/2012    Procedure: CECOSTOMY;  Surgeon: Fabio Bering, MD;  Location: [facility];  Service: General;  Laterality: N/A;  Cecostomy Tube Placement  . Colectomy  02/28/2012    Procedure: TOTAL COLECTOMY;  Surgeon: Fabio Bering, MD;  Location: [facility];  Service: General;  Laterality: N/A;  . Ileostomy  02/28/2012    Procedure: ILEOSTOMY;  Surgeon: Fabio Bering, MD;  Location: [facility];  Service: General;  Laterality: N/A;  . Cystoscopy w/ ureteral stent placement Bilateral 03/20/2012    Procedure: CYSTOSCOPY WITH RETROGRADE PYELOGRAM/URETERAL STENT PLACEMENT;  Surgeon: Sebastian Ache, MD;  Location: [facility] OR;  Service: Urology;  Laterality: Bilateral;  . Laparotomy N/A 03/26/2012    Procedure: EXPLORATORY LAPAROTOMY;  Surgeon: Mariella Saa, MD;  Location: [facility] OR;  Service: General;  Laterality: N/A;  . Application of wound vac N/A 03/26/2012    Procedure: APPLICATION OF WOUND VAC;  Surgeon: Mariella Saa, MD;  Location: [facility] OR;  Service: General;  Laterality: N/A;  . Liver biopsy N/A 03/26/2012    Procedure: LIVER BIOPSY;  Surgeon: Mariella Saa, MD;  Location: [facility]  OR;  Service: General;  Laterality: N/A;  . Laparotomy N/A 03/29/2012    Procedure: EXPLORATORY LAPAROTOMY, PARTIAL WOUND CLOSURE;  Surgeon: Mariella Saa, MD;  Location: [facility] OR;  Service: General;  Laterality: N/A;  . Vacuum assisted closure change N/A 03/29/2012    Procedure: Open ABDOMINAL VACUUM  CHANGE;  Surgeon: Mariella Saa, MD;  Location: [facility] OR;  Service: General;  Laterality: N/A;  . Vacuum assisted closure change N/A 04/01/2012    Procedure: removal of abdominal vac dressing and  abdominal closure;  Surgeon: Mariella Saa, MD;  Location: [facility] OR;  Service: General;  Laterality: N/A;    Assessment & Plan Clinical Impression: Patient is a 52 y.o. right-handed male with history of ulcerative colitis as well as prior diverticular perforation, who was admitted to [facility] on 02/26/2012 with low back pain, fever 102.5 and multiple episodes of watery diarrhea. Underwent flexible sigmoidoscopy 02/27/2012 per Dr.Rehman with findings of multiple pseudomembranes consistent with C. difficile colitis as well as low colorectal stricture. Underwent exploratory laparotomy,cecostomy tube placement per Dr. Dian Situ 02/27/2012. 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 Post op he developed ARDS with septic shock as well as renal failure due to AKI. On 02/06 he developed new onset Atrial fibrillation with RVR and was transferred to [facility] for treatment. Dr. Lorie Phenix consulted for input on metabolic acidosis with anasarca and non-oliguric ARF likely due to ischemic ATN in setting of hypotension and CRRT initiated. He was started on stress dose steroids and was treated with broad spectrum antibiotics. Dr. Derrell Lolling consulted for input and with close monitoring of wound. Wound evisceration treated with placement of retention sutures and wound closure on 3/9 by Dr. Johna Sheriff. 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 Neurology consulted for input global weakness and proximal RUE weakness. 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 Ct cervical spine with degenerative changes and large left pleural effusion. Dr. Kathrynn Running consulted for possible ureteral injury due to high output from JP drain and cysto with pyelogram revealed no  filling defects or extravasation. Dr. Madilyn Fireman consulted for input on abnormal LFTs. 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 Dr. Pearlean Brownie recommended ASA for now and anticoagulation once thrombocytopenia/LFTs resolve and patient able to swallow safely. Dr Ophelia Charter consulted due to right shoulder pain and inferior subluxation on xray. He felt inferior position due to RUE weakness and not dislocation He developed hypotension with GIB requiring multiple units PRBC on 3/10. He continues with high output from ileostomy with hypernatremia. Panda discontinued on 03/24 And follow up MBS done today due to dysphagia. Diet advanced to D3, thin liquids by tsp. He has had oral discomfort and was started on diflucan for thrush. Pain control remains an issue was placed on methadone. IV narcotics were discontinued yesterday. He's severely deconditioned and working on truncal and head control in sitting at EOB. Noted to have tachycardia with activity.  Patient transferred to CIR on 04/17/2012 .   Patient currently requires total with mobility secondary to muscle weakness and muscle paralysis, decreased cardiorespiratoy endurance, abnormal tone, motor apraxia and decreased motor planning, decreased midline  orientation, decreased initiation, decreased attention, decreased problem solving, decreased memory and pain in abdomen and decreased sitting balance, decreased standing balance, decreased postural control, decreased balance strategies and tetraplegia.  Prior to hospitalization, patient was independent  with mobility and lived with Spouse in a House home.  Home access is 1 step onto porch and one step into houseStairs to enter.  Patient will benefit from skilled PT intervention to maximize safe functional mobility, minimize  fall risk and decrease caregiver burden for planned discharge home with 24 hour assist.  Anticipate patient will benefit from follow up [facility] at discharge.  PT - End of Session Activity Tolerance: Decreased this session Endurance Deficit: Yes Endurance Deficit Description: complicated medical course, pain, extremely deconditioned PT Assessment Rehab Potential: Good Barriers to Discharge: None PT Plan PT Intensity: Minimum of 1-2 x/day ,45 to 90 minutes PT Frequency: Total of 15 hours over 7 days Frequency due to: complicated medical course, pain, extremely deconditioned PT Duration Estimated Length of Stay: 6 weeks PT Treatment/Interventions: Ambulation/gait training;Balance/vestibular training;Cognitive remediation/compensation;Discharge planning;Disease management/prevention;DME/adaptive equipment instruction;Functional electrical stimulation;Functional mobility training;Neuromuscular re-education;Pain management;Patient/family education;Psychosocial support;Skin care/wound management;Splinting/orthotics;Stair training;Therapeutic Activities;Therapeutic Exercise;UE/LE Strength taining/ROM;UE/LE Coordination activities;Wheelchair propulsion/positioning PT Recommendation Recommendations for Other Services: Neuropsych consult Follow Up Recommendations: Home health PT;24 hour supervision/assistance Patient destination: Home Equipment Recommended: Rolling walker with 5" wheels;Wheelchair (measurements);Wheelchair cushion (measurements) Equipment Details: TBD as patient progresses  Skilled Therapeutic Intervention   PT Evaluation Precautions/Restrictions Precautions Precautions: Shoulder;Fall Type of Shoulder Precautions: R shoulder subluxation Precaution Comments: Abd binder to protect incision, ostomy, tachycardia, Afib Required Braces or Orthoses: Other Brace/Splint Other Brace/Splint: R wrist cock up slint Restrictions Weight Bearing Restrictions: No General Chart Reviewed:  Yes Family/Caregiver Present: Yes Vital SignsTherapy Vitals Pulse Rate: 133 (with activity, 109 at rest) Oxygen Therapy SpO2: 96 % O2 Device: None (Room air) Pain Pain Assessment Pain Assessment: Faces Faces Pain Scale: Hurts even more Pain Type: Acute pain Pain Location: Abdomen Pain Descriptors: Sore Pain Onset: With Activity Pain Intervention(s): Rest;Repositioned Home Living/Prior Functioning Home Living Lives With: Spouse Available Help at Discharge: Family;Available 24 hours/day Type of Home: House Home Access: Stairs to enter [employer] of Steps: 1 step onto porch and one step into house Entrance Stairs-Rails: None Home Layout: One level Additional Comments: wife fmla, family close by and will help; his brother Prior Function Level of Independence: Independent with gait;Independent with transfers Able to Take Stairs?: Yes Driving: Yes Vocation: Full time employment Vocation Requirements: Chartered certified accountant Vision/Perception  Praxis Praxis: Impaired Praxis Impairment Details: Ideomotor;Initiation;Motor planning  Cognition Orientation Level: Oriented X4 (has some bouts of confusion/forgetfulness) Presents with impaired attention, memory, initiation, sequencing/apraxia during functional mobility Sensation Sensation Light Touch: Appears Intact Stereognosis: Not tested Hot/Cold: Not tested Proprioception: Not tested Additional Comments: Need to assess proprioception further Coordination Gross Motor Movements are Fluid and Coordinated: No Coordination and Movement Description: Significantly limited by weakness Motor  Motor Motor: Tetraplegia;Abnormal tone;Motor apraxia;Abnormal postural alignment and control;Motor impersistence Motor - Skilled Clinical Observations: Tetraplegia with R > L weakness, motor impersistence, impaired motor planning and initiation, sits with significant neck and trunk flexion  Mobility Bed Mobility Bed Mobility: Sit to Supine;Supine  to Sit Supine to Sit: 1: +2 Total assist;With rails;HOB flat Supine to Sit Details (indicate cue type and reason): Performed rolling in bed to L and R with total A with verbal cues for initiation of LE flexion and UE horizontal ADD for use of rails to don binder, boxers and pants and supine > sit from R side with +2 total  A Sit to Supine: 1: +2 Total assist Transfers Sit to Stand: From bed;1: +2 Total assist;From elevated surface;With upper extremity assist Sit to Stand Details (indicate cue type and reason): Unable to stand from bed fully even with +2 A  Lateral/Scoot Transfers: 1: +2 Total assist;With slide board;From elevated surface;With armrests removed Lateral/Scoot Transfer Details (indicate cue type and reason): Performed slideboard bed > w/c to L side with use of w/c arm rests and total A +2 with cues for anterior and lateral leans and use of UE to elevate buttocks.   Locomotion  Ambulation Ambulation: No Ambulation/Gait Assistance: Not tested (comment) Gait Gait: No Stairs / Additional Locomotion Stairs: No Wheelchair Mobility Wheelchair Mobility: Yes Wheelchair Assistance: 1: +1 Total assist Distance: 150 in tilt in space w/c for positioning, pressure relief with head support and elevating LR to assist with BP control; full lap tray also placed for UE support  Trunk/Postural Assessment  Cervical Assessment Cervical Assessment: Exceptions to [facility] (significant flexion and R lateral flexion) Thoracic Assessment Thoracic Assessment: Exceptions to [facility] (kyphotic) Lumbar Assessment Lumbar Assessment: Exceptions to [facility] (posterior pelvic tilt) Postural Control Postural Control: Deficits on evaluation (Sits with R lateral lean and trunk flexion)  Balance Static Sitting Balance Static Sitting - Balance Support: Bilateral upper extremity supported;Feet supported Static Sitting - Level of Assistance: 4: Min assist Dynamic Sitting Balance Dynamic Sitting - Balance Support: Right upper  extremity supported;Left upper extremity supported;Feet supported Dynamic Sitting - Level of Assistance: 2: Max assist;1: +1 Total assist Dynamic Sitting - Balance Activities: Lateral lean/weight shifting;Forward lean/weight shifting Extremity Assessment  RUE Assessment RUE Assessment: Exceptions to [facility] (right is dominant) RUE AROM (degrees) Overall AROM Right Upper Extremity: Due to pain;Deficits RUE Overall AROM Comments: AROM noted more distal than proximal, movement noted in elbow extension, pronation, wrist flex, minimal finger and thumb flex and ext movement. RUE PROM (degrees) Overall PROM Right Upper Extremity: Deficits;Due to pain;Due to precautions (Inferior GH subluxation) RUE Overall PROM Comments: Pain noted in end ranges for all UE movements.  Shoulder PROM achieved only with mobilization of scapula.  Fingers and thumb with increased pain during passive flexion. RUE Strength RUE Overall Strength: Deficits RUE Overall Strength Comments: Overall 0/5-1/5 shoulder, 1/5-2/5 distal to shoulder. LUE Assessment LUE Assessment: Exceptions to WFL LUE AROM (degrees) Overall AROM Left Upper Extremity: Deficits;Due to pain LUE Overall AROM Comments: XBM:WUXLK, forarm, wrist, fingers (except end range of first finger), and thumb,  RLE Assessment RLE Assessment: Exceptions to [facility] RLE Strength RLE Overall Strength: Deficits RLE Overall Strength Comments: 1-2/5 overall; ankle eversion 2/5 RLE Tone RLE Tone: Hypotonic LLE Assessment LLE Assessment: Exceptions to [facility] LLE Strength LLE Overall Strength: Deficits LLE Overall Strength Comments: 2-3/5 overall; poor activation in WB position  FIM:  FIM - Banker Devices: Sliding board;Bed rails;Orthosis Bed/Chair Transfer: 1: Supine > Sit: Total A (helper does all/Pt. < 25%);1: Sit > Supine: Total A (helper does all/Pt. < 25%);1: Bed > Chair or W/C: Total A (helper does all/Pt. < 25%);1: Chair or W/C >  Bed: Total A (helper does all/Pt. < 25%);1: Two helpers FIM - Locomotion: Wheelchair Distance: 150 in tilt in space w/c for positioning, pressure relief with head support and elevating LR to assist with BP control; full lap tray also placed for UE support Locomotion: Wheelchair: 1: Total Assistance/staff pushes wheelchair (Pt<25%) FIM - Locomotion: Ambulation Ambulation/Gait Assistance: Not tested (comment) Locomotion: Ambulation: 0: Activity did not occur FIM - Locomotion: Stairs Locomotion: Stairs: 0: Activity  did not occur   Refer to Care Plan for Long Term Goals  Recommendations for other services: Neuropsych  Discharge Criteria: Patient will be discharged from PT if patient refuses treatment 3 consecutive times without medical reason, if treatment goals not met, if there is a change in medical status, if patient makes no progress towards goals or if patient is discharged from hospital.  The above assessment, treatment plan, treatment alternatives and goals were discussed and mutually agreed upon: by patient and by family  Ladue Desanctis 04/18/2012, 11:28 AM

## 2012-04-18 NOTE — Progress Notes (Signed)
Discussed initiation of blood thinners due to embolic stroke with Vida Roller (PA for [facility]).  Patient Ok to initiate ASA and monitor for tolerance. If no side effects can go to something stronger. Will start ASA tomorrow past recheck of CBC in am--H/H on downward trend.  If stable in 7-10 days will check with neurology regarding appropriate treatment for embolic CVA.

## 2012-04-18 NOTE — Patient Care Conference (Signed)
Inpatient RehabilitationTeam Conference and Plan of Care Update Date: 04/18/2012   Time: 10:55 AM    Patient Name: Connor Small      Medical Record Number: [id number]  Date of Birth: 1960/09/15 Sex: Male         Room/Bed: 4036/4036-01 Payor Info: Payor: BLUE CROSS BLUE SHIELD  Plan: BCBS [place] PPO  Product Type: *No Product type*     Admitting Diagnosis: Deconditioned Exp Lap CVA  Admit Date/Time:  04/17/2012  4:10 PM Admission Comments: No comment available   Primary Diagnosis:  Critical illness myopathy Principal Problem: Critical illness myopathy  Patient Active Problem List   Diagnosis Date Noted  . 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 Acute blood loss anemia 04/18/2012  . S/p total colectomy for toxic megacolon, C diff 03/23/2012  . Hypernatremia, improving 03/23/2012  . Severe muscle deconditioning 03/23/2012  . Shoulder subluxation, right 03/23/2012  . 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 Coagulopathy 03/23/2012  . Quadriparesis 03/17/2012  . Leukocytosis, unspecified 03/15/2012  . Thrombocytopenia, unspecified 03/15/2012  . Hyperbilirubinemia, due to cholestasis 03/15/2012  . Atrial fibrillation, resolved 03/15/2012  . Anemia, mild. No evidence of active blood loss. No indication for PRBCs 03/15/2012  . Pleural effusion, L > R 03/04/2012  . Acute respiratory failure, resolved 03/02/2012  . Septic shock, resolved 03/02/2012  . C. difficile colitis 02/28/2012  . Colonic stricture 02/28/2012  . 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 ARF (acute renal failure) 02/28/2012  . Sinus tachycardia 02/28/2012  . Hematuria 02/28/2012  . history of UC (ulcerative colitis) 12/06/2010    Expected Discharge Date:    Team Members Present: Physician leading conference: Dr. Claudette Laws Social Worker Present: Dossie Der, LCSW Nurse Present: Other (comment) Minette Brine) PT Present: Edman Circle, PT;Other (comment) Clarisse Gouge Ripa-PT) OT  Present: Leonette Monarch, Felipa Eth, OT SLP Present: Fae Pippin, SLP Other (Discipline and Name): Charolette Child Coordinator     Current Status/Progress Goal Weekly Team Focus  Medical   tetraparesis, apraxia, dysphagia, ileostomy, cardiac deconditioning  improve activity tolerance  15h/7d schedule   Bowel/Bladder   cont of bladder/ has a colostomy  remain cont of bladder  remain cont of bladder   Swallow/Nutrition/ Hydration   eval pending         ADL's     eval pending        Mobility     eval pending        Communication   eval pending         Safety/Cognition/ Behavioral Observations  eval pending         Pain   denied pain at this time  pain less or equal to 3  pain less or equal to 3   Skin   no skin issues  free of skin infection and breakdown  free of skin infection and breakdown      *See Interdisciplinary Assessment and Plan and progress notes for long and short-term goals  Barriers to Discharge: unable to maintain hydration po, need 2 person assist to transfer    Possible Resolutions to Barriers:  intitiate therapy program    Discharge Planning/Teaching Needs:    Wife on a FMLA leave and does not plan to go back this school year.  Daughter to assist also at discharge.     Team Discussion:  Being evaluated in all areas.  IV fluids-pm  Neuro-psych eval eventually. Goals-min level shooting for  Revisions to Treatment Plan:  New eval  Continued Need for Acute Rehabilitation Level of Care: The patient requires daily medical management by a physician with specialized training in physical medicine and rehabilitation for the following conditions: Daily direction of a multidisciplinary physical rehabilitation program to ensure safe treatment while eliciting the highest outcome that is of practical value to the patient.: Yes Daily medical management of patient stability for increased activity during participation in an intensive rehabilitation regime.:  Yes Daily analysis of laboratory values and/or radiology reports with any subsequent need for medication adjustment of medical intervention for : Neurological problems;Other;Post surgical problems  Lucy Chris 04/19/2012, 9:06 AM

## 2012-04-18 NOTE — Progress Notes (Signed)
Patient information reviewed and entered into eRehab system by Daiva Nakayama, RN, CRRN, [place] Coordinator.  Information including medical coding and functional independence measure will be reviewed and updated through discharge.  Per discussion with Dr. Letta Pate patient's critical illness myopathy is the primary factor for functional deficits with bicerebral embolic infarcts being secondary co-morbidity.  Patient assigned Impairment Group Code 3.8 Neuromuscular Disorders based on this.

## 2012-04-19 ENCOUNTER — Inpatient Hospital Stay (HOSPITAL_COMMUNITY): Payer: BC Managed Care – PPO | Admitting: Occupational Therapy

## 2012-04-19 ENCOUNTER — Inpatient Hospital Stay (HOSPITAL_COMMUNITY): Payer: BC Managed Care – PPO | Admitting: Speech Pathology

## 2012-04-19 ENCOUNTER — Inpatient Hospital Stay (HOSPITAL_COMMUNITY): Payer: BC Managed Care – PPO | Admitting: Physical Therapy

## 2012-04-19 DIAGNOSIS — R5381 Other malaise: Secondary | ICD-10-CM

## 2012-04-19 DIAGNOSIS — I69991 Dysphagia following unspecified cerebrovascular disease: Secondary | ICD-10-CM

## 2012-04-19 DIAGNOSIS — G6281 Critical illness polyneuropathy: Secondary | ICD-10-CM

## 2012-04-19 DIAGNOSIS — G6181 Chronic inflammatory demyelinating polyneuritis: Secondary | ICD-10-CM

## 2012-04-19 DIAGNOSIS — I6340 Cerebral infarction due to embolism of unspecified cerebral artery: Secondary | ICD-10-CM

## 2012-04-19 LAB — CBC
Hemoglobin: 7.4 g/dL — ABNORMAL LOW (ref 13.0–17.0)
MCHC: 29.8 g/dL — ABNORMAL LOW (ref 30.0–36.0)
WBC: 10.3 10*3/uL (ref 4.0–10.5)

## 2012-04-19 LAB — BASIC METABOLIC PANEL
BUN: 34 mg/dL — ABNORMAL HIGH (ref 6–23)
GFR calc Af Amer: 67 mL/min — ABNORMAL LOW (ref >90)
GFR calc non Af Amer: 58 mL/min — ABNORMAL LOW (ref >90)
Potassium: 3.1 mEq/L — ABNORMAL LOW (ref 3.5–5.1)
Sodium: 156 mEq/L — ABNORMAL HIGH (ref 135–145)

## 2012-04-19 MED ORDER — ADULT MULTIVITAMIN W/MINERALS CH: 1.0000 | ORAL_TABLET | Freq: Every day | ORAL | Status: DC

## 2012-04-19 MED ORDER — ONDANSETRON 8 MG PO TBDP
8.0000 mg | ORAL_TABLET | ORAL | Status: AC
  Administered 2012-04-19: 8 mg via ORAL
  Filled 2012-04-19: qty 1

## 2012-04-19 MED ORDER — ASPIRIN EC 325 MG PO TBEC
325.0000 mg | DELAYED_RELEASE_TABLET | Freq: Every day | ORAL | Status: DC
  Administered 2012-04-20: 325 mg via ORAL
  Filled 2012-04-19: qty 1

## 2012-04-19 MED ORDER — MORPHINE SULFATE ER 15 MG PO TBCR
15.0000 mg | EXTENDED_RELEASE_TABLET | Freq: Two times a day (BID) | ORAL | Status: DC
  Administered 2012-04-19 (×2): 15 mg via ORAL
  Filled 2012-04-19 (×2): qty 1

## 2012-04-19 MED ORDER — MORPHINE SULFATE 15 MG PO TABS
15.0000 mg | ORAL_TABLET | ORAL | Status: DC | PRN
  Administered 2012-04-20: 15 mg via ORAL
  Filled 2012-04-19 (×2): qty 1

## 2012-04-19 MED ORDER — ONDANSETRON 8 MG PO TBDP
8.0000 mg | ORAL_TABLET | Freq: Three times a day (TID) | ORAL | Status: DC | PRN
  Administered 2012-04-19 – 2012-04-20 (×3): 8 mg via ORAL
  Filled 2012-04-19 (×4): qty 1

## 2012-04-19 MED ORDER — POTASSIUM CHLORIDE CRYS ER 20 MEQ PO TBCR
20.0000 meq | EXTENDED_RELEASE_TABLET | Freq: Two times a day (BID) | ORAL | Status: DC
  Administered 2012-04-19: 20 meq via ORAL
  Filled 2012-04-19 (×4): qty 1

## 2012-04-19 MED ORDER — POTASSIUM CHLORIDE 20 MEQ/15ML (10%) PO LIQD
20.0000 meq | Freq: Two times a day (BID) | ORAL | Status: DC
  Administered 2012-04-19: 20 meq via ORAL
  Filled 2012-04-19 (×4): qty 15

## 2012-04-19 MED ORDER — SACCHAROMYCES BOULARDII 250 MG PO CAPS
250.0000 mg | ORAL_CAPSULE | Freq: Two times a day (BID) | ORAL | Status: DC
  Administered 2012-04-19 – 2012-04-20 (×3): 250 mg via ORAL
  Filled 2012-04-19 (×10): qty 1

## 2012-04-19 NOTE — Progress Notes (Signed)
Nursing Note: Pt 's wife called requesting nadsea medication due to pt c/o nausea.Med is not due yet so paged on-call.Before I was able to talk w/ on-call,pt called again saying pt was about to throw-up.A: Obtained an order for zofran 8 mg and will give as soon as it comes up from pharmacy.wbb

## 2012-04-19 NOTE — Progress Notes (Signed)
Patient ID: Connor Small, male   DOB: 08/31/1960, 52 y.o.   MRN: [id number] Patient ID: Connor Small, male   DOB: 13-Oct-1960, 52 y.o.   MRN: [id number] 52 y.o. right-handed male with history of ulcerative colitis as well as prior diverticular perforation, who was admitted to [facility] on 02/26/2012 with low back pain, fever 102.5 and multiple episodes of watery diarrhea. Underwent flexible sigmoidoscopy 02/27/2012 per Dr.Rehman with findings of multiple pseudomembranes consistent with C. difficile colitis as well as low colorectal stricture. Underwent exploratory laparotomy,cecostomy tube placement per Dr. Dian Situ 02/27/2012. 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 Post op he developed ARDS with septic shock as well as renal failure due to AKI. On 02/06 he developed new onset Atrial fibrillation with RVR and was transferred to [facility] for treatment. Dr. Lorie Phenix consulted for input on metabolic acidosis with anasarca and non-oliguric ARF likely due to ischemic ATN in setting of hypotension and CRRT initiated. He was started on stress dose steroids and was treated with broad spectrum antibiotics. Dr. Derrell Lolling consulted for input and with close monitoring of wound. Wound evisceration treated with placement of retention sutures and wound closure on 3/9 by Dr. Johna Sheriff.  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 Neurology consulted for input global weakness and proximal RUE weakness. 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 Ct cervical spine with degenerative changes and large left pleural effusion. Dr. Kathrynn Running consulted for possible ureteral injury due to high output from JP drain and cysto with pyelogram revealed no filling  defects or extravasation. Dr. Madilyn Fireman consulted for input on abnormal LFTs. 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 Dr. Pearlean Brownie recommended ASA for now and anticoagulation once thrombocytopenia/LFTs resolve and patient able to swallow safely. Dr Ophelia Charter consulted due to right shoulder pain and inferior subluxation on xray. He felt inferior position due to RUE weakness and not dislocation He developed hypotension with GIB requiring multiple units PRBC on 3/10. He continues with high output from ileostomy with hypernatremia. Panda discontinued on 03/24 And follow up MBS done today due to dysphagia. Diet advanced to D3, thin liquids by tsp. He has had oral discomfort and was started on diflucan for thrush. Pain control remains an issue was placed on methadone.  Subjective/Complaints: Sleep poor, doesn't like IVF at noc, poor po fluids   Objective: Vital Signs: Blood pressure 106/69, pulse 103, temperature 98.6 F (37 C), temperature source Oral, resp. rate 20, height 5\' 10"  (1.778 m), weight 71.94 kg (158 lb 9.6 oz), SpO2 100.00%.     Results for orders placed during the hospital encounter of 04/17/12 (from the past 72 hour(s))  FERRITIN     Status: Abnormal   Collection Time    04/18/12  5:25 AM      Result Value Range   Ferritin 857 (*) 22 - 322 ng/mL  FOLATE     Status: None   Collection Time    04/18/12  5:25 AM      Result Value Range   Folate 19.1     Comment: (NOTE)     Reference Ranges  Deficient:       0.4 - 3.3 ng/mL            Indeterminate:   3.4 - 5.4 ng/mL            Normal:              > 5.4 ng/mL  IRON AND TIBC     Status: Abnormal   Collection Time    04/18/12  5:25 AM      Result Value Range   Iron 17 (*) 42 - 135 ug/dL   TIBC 161 (*) 096 - 045  ug/dL   Saturation Ratios 12 (*) 20 - 55 %   UIBC 128  125 - 400 ug/dL  RETICULOCYTES     Status: Abnormal   Collection Time    04/18/12  5:25 AM      Result Value Range   Retic Ct Pct 1.4  0.4 - 3.1 %   RBC. 2.30 (*) 4.22 - 5.81 MIL/uL   Retic Count, Manual 32.2  19.0 - 186.0 K/uL  VITAMIN B12     Status: None   Collection Time    04/18/12  5:25 AM      Result Value Range   Vitamin B-12 336  211 - 911 pg/mL  CBC WITH DIFFERENTIAL     Status: Abnormal   Collection Time    04/18/12  5:25 AM      Result Value Range   WBC 9.4  4.0 - 10.5 K/uL   RBC 2.30 (*) 4.22 - 5.81 MIL/uL   Hemoglobin 7.2 (*) 13.0 - 17.0 g/dL   HCT 40.9 (*) 81.1 - 91.4 %   MCV 104.3 (*) 78.0 - 100.0 fL   MCH 31.3  26.0 - 34.0 pg   MCHC 30.0  30.0 - 36.0 g/dL   RDW 78.2 (*) 95.6 - 21.3 %   Platelets 275  150 - 400 K/uL   Neutrophils Relative 81 (*) 43 - 77 %   Neutro Abs 7.7  1.7 - 7.7 K/uL   Lymphocytes Relative 10 (*) 12 - 46 %   Lymphs Abs 1.0  0.7 - 4.0 K/uL   Monocytes Relative 7  3 - 12 %   Monocytes Absolute 0.6  0.1 - 1.0 K/uL   Eosinophils Relative 1  0 - 5 %   Eosinophils Absolute 0.1  0.0 - 0.7 K/uL   Basophils Relative 0  0 - 1 %   Basophils Absolute 0.0  0.0 - 0.1 K/uL  COMPREHENSIVE METABOLIC PANEL     Status: Abnormal   Collection Time    04/18/12  5:25 AM      Result Value Range   Sodium 157 (*) 135 - 145 mEq/L   Potassium 3.4 (*) 3.5 - 5.1 mEq/L   Chloride 126 (*) 96 - 112 mEq/L   CO2 21  19 - 32 mEq/L   Glucose, Bld 108 (*) 70 - 99 mg/dL   BUN 36 (*) 6 - 23 mg/dL   Creatinine, Ser 0.86  0.50 - 1.35 mg/dL   Calcium 8.4  8.4 - 57.8 mg/dL   Total Protein 5.3 (*) 6.0 - 8.3 g/dL   Albumin 1.5 (*) 3.5 - 5.2 g/dL   AST 27  0 - 37 U/L   ALT 39  0 - 53 U/L   Alkaline Phosphatase 341 (*) 39 - 117 U/L   Total Bilirubin 1.5 (*) 0.3 - 1.2 mg/dL   GFR calc non Af Amer 64 (*) >90  mL/min   GFR calc Af Amer 74 (*) >90 mL/min   Comment:            The eGFR has been calculated     using the  CKD EPI equation.     This calculation has not been     validated in all clinical     situations.     eGFR's persistently     <90 mL/min signify     possible Chronic Kidney Disease.  BASIC METABOLIC PANEL     Status: Abnormal   Collection Time    04/19/12  6:20 AM      Result Value Range   Sodium 156 (*) 135 - 145 mEq/L   Potassium 3.1 (*) 3.5 - 5.1 mEq/L   Chloride 124 (*) 96 - 112 mEq/L   CO2 23  19 - 32 mEq/L   Glucose, Bld 101 (*) 70 - 99 mg/dL   BUN 34 (*) 6 - 23 mg/dL   Creatinine, Ser 4.69 (*) 0.50 - 1.35 mg/dL   Calcium 8.6  8.4 - 62.9 mg/dL   GFR calc non Af Amer 58 (*) >90 mL/min   GFR calc Af Amer 67 (*) >90 mL/min   Comment:            The eGFR has been calculated     using the CKD EPI equation.     This calculation has not been     validated in all clinical     situations.     eGFR's persistently     <90 mL/min signify     possible Chronic Kidney Disease.  CBC     Status: Abnormal   Collection Time    04/19/12  6:20 AM      Result Value Range   WBC 10.3  4.0 - 10.5 K/uL   RBC 2.38 (*) 4.22 - 5.81 MIL/uL   Hemoglobin 7.4 (*) 13.0 - 17.0 g/dL   HCT 52.8 (*) 41.3 - 24.4 %   MCV 104.2 (*) 78.0 - 100.0 fL   MCH 31.1  26.0 - 34.0 pg   MCHC 29.8 (*) 30.0 - 36.0 g/dL   RDW 01.0 (*) 27.2 - 53.6 %   Platelets 317  150 - 400 K/uL     HEENT: thrush Cardio: RRR and tachy Resp: CTA B/L and unlabored GI: BS positive and Non distended Extremity:  No Edema Skin:   Wound RLQ ileostomy with liquid output, ostomy site ok, Mild line retention sutures with Wet/dry 4/4 packing Neuro: Alert/Oriented, Anxious, Cranial Nerve II-XII normal, Normal Sensory and Abnormal Motor L delt 3-/5, bi,tri, grip 4-/5, R delt 1/5, R bi 0/5, R tri 3/5, R wrist ext 0/5, R hand intrinsics 0/5 abd, 3/5 add,2-/5 B HF, KE, 0/5 ankle DF, 3-/5 ankle eversion and PF Musc/Skel:  Other No pain with PROM of UE or LEs Gen:  anxious no acute distress 3+bilateral knee and 1+ bilateral ankle  DTRs  Assessment/Plan: 1. 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 Physiatrist is providing close team supervision and 24 hour management of active medical problems listed below. Physiatrist and rehab team continue to assess barriers to discharge/monitor patient progress toward functional and medical goals. FIM: FIM - Bathing Bathing Steps Patient Completed: Chest Bathing: 1: Two helpers  FIM - Upper Body Dressing/Undressing Upper body dressing/undressing: 0: Wears gown/pajamas-no public clothing FIM - Lower Body  Dressing/Undressing Lower body dressing/undressing: 0: Activity did not occur  FIM - Toileting Toileting: 1: Total-Patient completed zero steps, helper did all 3     FIM - Banker Devices: Sliding board;Bed rails;Orthosis Bed/Chair Transfer: 1: Supine > Sit: Total A (helper does all/Pt. < 25%);1: Sit > Supine: Total A (helper does all/Pt. < 25%);1: Bed > Chair or W/C: Total A (helper does all/Pt. < 25%);1: Chair or W/C > Bed: Total A (helper does all/Pt. < 25%);1: Two helpers  FIM - Locomotion: Wheelchair Distance: 150 in tilt in space w/c for positioning, pressure relief with head support and elevating LR to assist with BP control; full lap tray also placed for UE support Locomotion: Wheelchair: 1: Total Assistance/staff pushes wheelchair (Pt<25%) FIM - Locomotion: Ambulation Ambulation/Gait Assistance: Not tested (comment) Locomotion: Ambulation: 0: Activity did not occur  Comprehension Comprehension Mode: Auditory Comprehension: 3-Understands basic 50 - 74% of the time/requires cueing 25 - 50%  of the time  Expression Expression Mode: Verbal Expression: 4-Expresses basic 75 - 89% of the time/requires cueing 10 - 24% of the time. Needs helper to occlude  trach/needs to repeat words.  Social Interaction Social Interaction Mode: Asleep Social Interaction: 4-Interacts appropriately 75 - 89% of the time - Needs redirection for appropriate language or to initiate interaction.  Problem Solving Problem Solving Mode: Not assessed Problem Solving: 2-Solves basic 25 - 49% of the time - needs direction more than half the time to initiate, plan or complete simple activities  Memory Memory Mode: Not assessed Memory: 1-Recognizes or recalls less than 25% of the time/requires cueing greater than 75% of the time   Medical Problem List and Plan:  1. DVT Prophylaxis/Anticoagulation: Mechanical: Sequential compression devices, below knee Bilateral lower extremities thrombocytopenia resolved. 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 GIB 03/10.  -dopplers performed 3/7 were negative  2. Multifactorial pain: Stop fentanyl, methadone, start MS Contin CR 15mg  Q12h, MSIR 15mg  q4H prn Abdominal distention/ discomfort continues.  3. Mood: Distracted due to multiple medical issues. Underlying cognitive issues also clouding the picture. Will need a lot of ego support by the team. Neuropsychologist and LCSW to follow additionally for evaluation and support once patient gets into a routine.  4. Neuropsych: This patient is not capable of making decisions on his/her own behalf.  5. Hypernatremia: due to high volume losses. Will add HS hydration as doubt he'll be able to maintain adequate hydration on liquids by tsp. Change IVF to D5W.  6. Bilateral cardioembolic CVA: no blood thinners due to issues with bleeding?  7. ABLA: will continue to monitor H/H and transfuse for hgb <7.0. Currently ranging from 7.6- 8.4 range. Will check anemia panel. MCV 102. May benefit from aranesp.  8. Critical illness neuropathy/myopathy-  Protect heels, stretch heel cords  -AFO's at some point when mobility justifies them 9.  Toxic megacolon s/p total colectomy.  Has high output  ileostomy 10.Marland Kitchen Hypernatremia and hyperchloremia due to high output ileostomy  LOS (Days) 2 A FACE TO FACE EVALUATION WAS PERFORMED  Zephaniah Lubrano E 04/19/2012, 9:06 AM

## 2012-04-19 NOTE — Consult Note (Signed)
WOC ostomy follow-up consult  Stoma type/location: Ileostomy to right lower quad Stomal assessment/size: Stoma red and viable, 1 inch, flush with skin level.   Peristomal assessment: Previous maceration to skin on right abd is resolved, pink and dry.  Previous mucocutaneous separation to peristoma area is resolving slowly.  Skin surrounding stoma intact. Treatment options for stomal/peristomal skin: Foam dressing discontinued; area open to air Output 50cc liquid brown stool Ostomy pouching: 2 piece with barrier ring Education provided: Wife participated in pouch change.  She is able to cut and apply barrier ring using 2 piece appliance, but feels one piece appliance will be easier to use.  She applied pouch with minimal assistance and is able to open and close velcro to empty.  Pt familiar with pouching routines from previous colostomy surgery.  Discussed dietary precautions, importance of replacing fluids, and implications for time-release meds.  Pt and wife asking appropriate questions.  Supplies at bedside for pt and staff use.  Wife states she has reviewed Scientist, clinical (histocompatibility and immunogenetics).  Julien Girt MSN, RN, [place], [place], [place]

## 2012-04-19 NOTE — Progress Notes (Signed)
Physical Therapy Session Note  Patient Details  Name: Connor Small MRN: [id number] Date of Birth: 02/07/1960  Today's Date: 04/19/2012 Time: 4098-1191 Time Calculation (min): 60 min  Short Term Goals: Week 1:  PT Short Term Goal 1 (Week 1): Patient will initate and assist with bed mobility 25% of the time for LE and UE positioning but perform with total A +1 PT Short Term Goal 2 (Week 1): Patient will performed bed <> w/c transfers with slideboard and max A with patient initiating and assisting 25% of the time PT Short Term Goal 3 (Week 1): Patient will perform dynamic sitting balance, head and trunk control training on mat with mod A overall for 5 minutes PT Short Term Goal 4 (Week 1): Will perform static standing in lift equipment (total A) for trunk/postural control and bilat LE WB x 5 minutes  Skilled Therapeutic Interventions/Progress Updates:   Patient reporting significant discomfort in tilt in space w/c from yesterday and unable to tolerate sitting upright in w/c.  Switched to another tilt in space w/c with reclining back with rounded curvature and lateral supports.  Adjusted position of back and headrest on w/c.  Patient performed rolling in bed with bed rails to L and R multiple times with verbal cues and extra time for patient to initiated LE flexion and rotation and UE horizontal ADD and protraction to assist with rolling to don abd binder and lift sling.  Patient lifted with Maxi move to tilt in space w/c and fit of back and head rest assessed for maximum positioning for meals and minimize pressure points.  Patient reports improvement in comfort with positioning.  Pillows placed under UE for support.  Patient to stay up in w/c for 2 hours today.    Therapy Documentation Precautions:  Precautions Precautions: Shoulder;Fall Type of Shoulder Precautions: R shoulder subluxation Precaution Comments: Abd binder to protect incision, ostomy, tachycardia, Afib Required Braces or Orthoses:  Other Brace/Splint Other Brace/Splint: R wrist cock up slint Restrictions Weight Bearing Restrictions: No Vital Signs: Therapy Vitals Temp: 98.4 F (36.9 C) Temp src: Oral Pulse Rate: 100 Resp: 18 BP: 119/65 mmHg Patient Position, if appropriate: Lying Oxygen Therapy SpO2: 99 % O2 Device: None (Room air) Pain: Pain Assessment Pain Assessment: No/denies pain  See FIM for current functional status  Therapy/Group: Individual Therapy  [name] [facility] 04/19/2012, 5:01 PM

## 2012-04-19 NOTE — Progress Notes (Signed)
Patient vomited small amount after taking a sip of Pro-stat, patient states, "I don't want it anymore."  Patient denies feeling nauseas and refuses zofran when offered.

## 2012-04-19 NOTE — Progress Notes (Signed)
Chaplain visited with pt and family. Pt was resting in bed, awake, alert, and oriented. Pt's wife was at bedside. Chaplain provided spiritual comfort, support, and prayer for pt and pt's wife. Pt and wife expressed appreciation for chaplain support.   Spiritual Assessment: Pt and family are emotionally and spiritually exhausted from many days of hospitalization. Pt feels a strong need of daily prayer to help him and his family endure the ongoing process of recovery.  Pt seems increasingly discouraged but expresses faith that recovery will happen in "God's time." Chaplain will follow up as requested.  04/19/12 1400  Clinical Encounter Type  Visited With Patient and family together  Visit Type Spiritual support  Referral From Patient  Recommendations Pt requests daily visits  Spiritual Encounters  Spiritual Needs Emotional;Prayer  Stress Factors  Patient Stress Factors Health changes;Exhausted;Major life changes  Family Stress Factors Exhausted;Major life changes;Loss of control  Jearld Lesch, Chaplain [phone]

## 2012-04-19 NOTE — Progress Notes (Signed)
Speech Language Pathology Daily Session Note  Patient Details  Name: Connor Small MRN: [id number] Date of Birth: 1960/09/21  Today's Date: 04/19/2012 Time: 1130-1215 Time Calculation (min): 45 min  Short Term Goals: Week 1: SLP Short Term Goal 1 (Week 1): Patient will recall and utilize safe swallow compensatory strategies with mod assist verbal cues. SLP Short Term Goal 2 (Week 1): Patient will complete oropharyngeal strengthening exercises x10 each with moderate verbal cues.   SLP Short Term Goal 3 (Week 1): Patient will sustian attention to basic task for 3 minutes with with mod assist verbal cues for re-direction.  SLP Short Term Goal 4 (Week 1): Patient will utilize external aids to recall daily information with mod assist verbal cues.  Skilled Therapeutic Interventions: Treatment session focused on addressing dysphagia goals.  SLP facilitated session with mod assist question cues to recall previously taught exercises without use of handout due to patient's wife being unable to find it.  Patient required max faded to mod assist verbal, visual and tactile cues to perform exercises to address respiratory support, tongue and pharyngeal strength and glottic closure. Patient sustained expiration for 2 seconds at a time.  Trials of thin liquids via cup resulted in occasional wet vocal quality that patient was able to self monitor and correct with increased wait time.  Patient and wife educated on need to take cup sips the size of a teaspoon sip; both verbalized understanding and patient demonstrated with min assist cues.  Recommend trials with wife present.   FIM:  Comprehension Comprehension Mode: Auditory Comprehension: 4-Understands basic 75 - 89% of the time/requires cueing 10 - 24% of the time Expression Expression Mode: Verbal Expression: 4-Expresses basic 75 - 89% of the time/requires cueing 10 - 24% of the time. Needs helper to occlude trach/needs to repeat words. Social  Interaction Social Interaction: 3-Interacts appropriately 50 - 74% of the time - May be physically or verbally inappropriate. Problem Solving Problem Solving: 2-Solves basic 25 - 49% of the time - needs direction more than half the time to initiate, plan or complete simple activities Memory Memory: 1-Recognizes or recalls less than 25% of the time/requires cueing greater than 75% of the time FIM - Eating Eating Activity: 4: Help with managing cup/glass  Pain  on going, unable to rate, RN aware and patient premedicated  Therapy/Group: Individual Therapy  Carmelia Roller., CCC-SLP [phone]  [place] 04/19/2012, 1:15 PM

## 2012-04-19 NOTE — Progress Notes (Signed)
Nursing Note: Zofran 8 mg po given .Pt resting quietly in bed.wbb

## 2012-04-19 NOTE — Progress Notes (Signed)
Occupational Therapy Session Note  Patient Details  Name: Connor Small MRN: [id number] Date of Birth: [birth year]  Today's Date: 04/19/2012 Time: 1035-1100 Time Calculation (min): 25 min (missed 35 min due to nausea and vomiting)  Short Term Goals: Week 1:  OT Short Term Goal 1 (Week 1): Self Feeding:  Patient will feed himself at least 50% of 2 meals per day during this recording period. OT Short Term Goal 2 (Week 1): Grooming:  Patient will perform 2 grooming tasks with min assist to include set up once items needed are provided OT Short Term Goal 3 (Week 1): UB Dressing:  Patient will don shirt in supported sit with max assist OT Short Term Goal 4 (Week 1): Sitting balance:  Patient will sit without back support with mod assist while completing simple BADL task at least 2 days this recording period. OT Short Term Goal 5 (Week 1): UE exercises:  Patient will tolerate BUE exercises at least 4 times during this recording period.  Skilled Therapeutic Interventions/Progress Updates:  Educated patient and wife on type clothes that would be best to bring for dressing with OT,  discharge planning related to need measure doorways and discussed use of bathroom and possible need for renovations at home.  Wife states they have supportive family who will do what is needed.  Reviewed handout of UE exercises however patient unable to participate due to nausea and vomiting.     Therapy Documentation Precautions:  Precautions Precautions: Shoulder;Fall Type of Shoulder Precautions: R shoulder subluxation Precaution Comments: Abd binder to protect incision, ostomy, tachycardia, Afib Required Braces or Orthoses: Other Brace/Splint Other Brace/Splint: R wrist cock up slint Restrictions Weight Bearing Restrictions: No Pain: Denies pain See FIM for current functional status  Therapy/Group: Individual Therapy  Jasyah Theurer, [place] 04/19/2012, 12:22 PM

## 2012-04-20 ENCOUNTER — Inpatient Hospital Stay (HOSPITAL_COMMUNITY): Payer: BC Managed Care – PPO | Admitting: Speech Pathology

## 2012-04-20 ENCOUNTER — Inpatient Hospital Stay (HOSPITAL_COMMUNITY): Payer: BC Managed Care – PPO | Admitting: Occupational Therapy

## 2012-04-20 DIAGNOSIS — I6340 Cerebral infarction due to embolism of unspecified cerebral artery: Secondary | ICD-10-CM

## 2012-04-20 DIAGNOSIS — G6281 Critical illness polyneuropathy: Secondary | ICD-10-CM

## 2012-04-20 DIAGNOSIS — R5381 Other malaise: Secondary | ICD-10-CM

## 2012-04-20 DIAGNOSIS — G6181 Chronic inflammatory demyelinating polyneuritis: Secondary | ICD-10-CM

## 2012-04-20 DIAGNOSIS — I69991 Dysphagia following unspecified cerebrovascular disease: Secondary | ICD-10-CM

## 2012-04-20 LAB — BASIC METABOLIC PANEL
Chloride: 115 mEq/L — ABNORMAL HIGH (ref 96–112)
GFR calc Af Amer: 63 mL/min — ABNORMAL LOW (ref >90)
GFR calc non Af Amer: 54 mL/min — ABNORMAL LOW (ref >90)
Glucose, Bld: 332 mg/dL — ABNORMAL HIGH (ref 70–99)
Potassium: 3.9 mEq/L (ref 3.5–5.1)
Sodium: 147 mEq/L — ABNORMAL HIGH (ref 135–145)

## 2012-04-20 LAB — GLUCOSE, CAPILLARY: Glucose-Capillary: 93 mg/dL (ref 70–99)

## 2012-04-20 MED ORDER — LIDOCAINE HCL 2 % EX GEL
CUTANEOUS | Status: DC | PRN
  Administered 2012-04-20: 20 via TOPICAL
  Filled 2012-04-20: qty 5

## 2012-04-20 MED ORDER — PANTOPRAZOLE SODIUM 40 MG PO TBEC
40.0000 mg | DELAYED_RELEASE_TABLET | Freq: Every day | ORAL | Status: DC
  Administered 2012-04-20: 40 mg via ORAL
  Filled 2012-04-20: qty 1

## 2012-04-20 MED ORDER — INSULIN ASPART 100 UNIT/ML ~~LOC~~ SOLN: 0.0000 [IU] | Freq: Every day | SUBCUTANEOUS | Status: DC

## 2012-04-20 MED ORDER — PANTOPRAZOLE SODIUM 40 MG PO TBEC
40.0000 mg | DELAYED_RELEASE_TABLET | Freq: Two times a day (BID) | ORAL | Status: DC
  Administered 2012-04-20: 40 mg via ORAL
  Filled 2012-04-20 (×2): qty 1

## 2012-04-20 MED ORDER — ONDANSETRON 8 MG/NS 50 ML IVPB
8.0000 mg | Freq: Four times a day (QID) | INTRAVENOUS | Status: DC | PRN
  Administered 2012-04-21 – 2012-04-24 (×10): 8 mg via INTRAVENOUS
  Filled 2012-04-20 (×15): qty 8

## 2012-04-20 MED ORDER — BOOST / RESOURCE BREEZE PO LIQD: 1.0000 | Freq: Every day | ORAL | Status: DC | PRN

## 2012-04-20 MED ORDER — ONDANSETRON 8 MG PO TBDP
8.0000 mg | ORAL_TABLET | Freq: Four times a day (QID) | ORAL | Status: DC | PRN
  Administered 2012-04-20 (×2): 8 mg via ORAL
  Filled 2012-04-20 (×4): qty 1

## 2012-04-20 MED ORDER — ASPIRIN 325 MG PO TABS
325.0000 mg | ORAL_TABLET | Freq: Every day | ORAL | Status: DC
  Filled 2012-04-20 (×2): qty 1

## 2012-04-20 MED ORDER — DEXTROSE 5 % IV SOLN
INTRAVENOUS | Status: DC
  Administered 2012-04-20: 18:00:00 via INTRAVENOUS
  Filled 2012-04-20 (×3): qty 1000

## 2012-04-20 MED ORDER — ENSURE COMPLETE PO LIQD: 237.0000 mL | Freq: Every day | ORAL | Status: DC | PRN

## 2012-04-20 MED ORDER — INSULIN ASPART 100 UNIT/ML ~~LOC~~ SOLN
0.0000 [IU] | Freq: Three times a day (TID) | SUBCUTANEOUS | Status: DC
  Administered 2012-04-21: 2 [IU] via SUBCUTANEOUS

## 2012-04-20 NOTE — Progress Notes (Signed)
Occupational Therapy Session Note  Patient Details  Name: Connor Small MRN: [id number] Date of Birth: 1960-04-15  Today's Date: 04/20/2012 Time: 1040-1140 Time Calculation (min): 60 min  Short Term Goals: Week 1:  OT Short Term Goal 1 (Week 1): Self Feeding:  Patient will feed himself at least 50% of 2 meals per day during this recording period. OT Short Term Goal 2 (Week 1): Grooming:  Patient will perform 2 grooming tasks with min assist to include set up once items needed are provided OT Short Term Goal 3 (Week 1): UB Dressing:  Patient will don shirt in supported sit with max assist OT Short Term Goal 4 (Week 1): Sitting balance:  Patient will sit without back support with mod assist while completing simple BADL task at least 2 days this recording period. OT Short Term Goal 5 (Week 1): UE exercises:  Patient will tolerate BUE exercises at least 4 times during this recording period.  Skilled Therapeutic Interventions/Progress Updates:  Patient's wife and daughter present upon arrival then went to the family room after brief update from wife regarding medications, sleep, nausea, replacement w/c still not comfortable stating that his bottom hurts, began discussion related to pressure relief while in w/c and the need to relieve pressure on a regular basis, before his bottom hurts.  Patient declined to perform scheduled dressing session due to nausea and requested UE exercises.  Right shoulder PROM/AAROM & AROM with a great deal of time spend on educating patient on the progress expected and his responsibility to be able to guide others through his exercises.  Patient reports minimal pain during end ranges of shoulder flexion describing muscle tightness.  Patient reported that he needed to urinate and asked that his wife come to assist with urinal.  Explained that I would be glad to assist with this and we probably need to begin getting used to this process with other individuals and he agreed.   Patient proceeded to guide this clinician through the task and patient assisted to stabilize the urinal in bed ~50% of the time.  SLP arrived to assist with eating lunch and assisted to reposition patient in bed for optimal position for self feeding and swallowing.  Patient reports increased nausea with this minimal movement and requested HOB not to be too high at first until his stomach settled.  Began some teaching regarding assiting patient during self feding.  Therapy Documentation Precautions:  Precautions Precautions: Shoulder;Fall Type of Shoulder Precautions: R shoulder subluxation Precaution Comments: Abd binder to protect incision, ostomy, tachycardia, Afib Required Braces or Orthoses: Other Brace/Splint Other Brace/Splint: R wrist cock up slint Restrictions Weight Bearing Restrictions: No Pain: Denies pain  Therapy/Group: Individual Therapy  [name], [place] 04/20/2012, 12:14 PM

## 2012-04-20 NOTE — Progress Notes (Signed)
I was asked to see the patient about his 3 retention sutures #5 ethibond on his most recent surgery by Dr. Excell Seltzer on 04/01/12.  They wanted to know if they can come out yet.  I discussed this with Dr. Grandville Silos who said they need to stay in for approximately 6-8 weeks.  PE: Ileostomy patent with good output Midline wound is  Healing well, pink granulation tissue, with very little yellow slough, purple PDS sutures visible  And granulation is filling in around these.  The 3 external retention sutures are in place and adequately holding.  No signs of infection or wound dehiscence.    Assessment and Plan: 1.  Retention sutures need to be in place for 6-8 weeks prior to removal.  These have only been in 19 days.  Please re-consult [place] closer to that time or when the patient is being discharged from rehab to arrange follow up.

## 2012-04-20 NOTE — Progress Notes (Signed)
Orthopedic Tech Progress Note Patient Details:  Connor Small 01-09-1961 [id number]  Patient ID: Connor Small, male   DOB: [birth year], 52 y.o.   MRN: [id number]   Connor Small 04/20/2012, 7:00 PM Called Advance talked to [place].

## 2012-04-20 NOTE — Progress Notes (Signed)
Physical Therapy Session Note  Patient Details  Name: Connor Small MRN: [id number] Date of Birth: 1960/08/02  Today's Date: 04/20/2012 Time: 1300-1350 Time Calculation (min): 50 min  Short Term Goals: Week 1:  PT Short Term Goal 1 (Week 1): Patient will initate and assist with bed mobility 25% of the time for LE and UE positioning but perform with total A +1 PT Short Term Goal 2 (Week 1): Patient will performed bed <> w/c transfers with slideboard and max A with patient initiating and assisting 25% of the time PT Short Term Goal 3 (Week 1): Patient will perform dynamic sitting balance, head and trunk control training on mat with mod A overall for 5 minutes PT Short Term Goal 4 (Week 1): Will perform static standing in lift equipment (total A) for trunk/postural control and bilat LE WB x 5 minutes  Skilled Therapeutic Interventions/Progress Updates:   Patient very nauseous this pm and requesting to stay in bed; agreed to therapy bedside.  Patient also reporting LLE muscle spasms.  RN notified for medication.  Therapy Documentation Precautions:  Precautions Precautions: Shoulder;Fall Type of Shoulder Precautions: R shoulder subluxation Precaution Comments: Abd binder to protect incision, ostomy, tachycardia, Afib Required Braces or Orthoses: Other Brace/Splint Other Brace/Splint: R wrist cock up slint Restrictions Weight Bearing Restrictions: No General: Amount of Missed PT Time (min): 10 Minutes Missed Time Reason: Patient ill (comment) (nausea) Pain: Pain Assessment Pain Assessment: 0-10 Pain Score:   3 Pain Type: Acute pain Pain Location: Leg Pain Orientation: Left Pain Descriptors: Aching;Spasm Pain Frequency: Intermittent Pain Onset: Gradual Patients Stated Pain Goal: 3 Pain Intervention(s): RN made aware;Repositioned;Therapeutic touch Multiple Pain Sites: No Mobility:  After exercises patient performed bridging and scooting hips and shoulders to L with max A and  rolling to R side with max-total A overall but patient assisting 25% with bilat LE flexion and reaching with LUE across midline for rail.  Patient positioned on R side with pillows to maintain position and provide pressure relief to buttocks. Exercises:  Performed bilat ankle/gastroc stretches gradually to increase available ankle DF ROM and minimize risk for contractures.  Discussed with RN switching from [facility] boots to regular PRAFO in bed to allow improved positioning of foot in DF + pressure relief on heel and hip rotation positioning in neutral.  RN to discuss with PA.   Other Treatments: Treatments Neuromuscular Facilitation: Right;Left;Lower Extremity;Upper Extremity;Activity to increase coordination;Activity to increase motor control;Activity to increase timing and sequencing;Activity to increase grading;Activity to increase sustained activation in supine with bilat LE during AA heel slides with focus on concentric activation and eccentric control to lower, LE marches in bed for hip flexion, hip IR and ER with knees in flexed position with focus on concentric and eccentric control, isometric glute contractions, lower trunk rotations for activation and lateral hip stretching to assist with rolling.  With RUE performed AA shoulder and elbow flexion <> extension combined and elbow flexion <> extension in isolation to facilitate bringing objects to mouth.  Patient tolerated well with rest breaks and had no c/o nausea during session.    See FIM for current functional status  Therapy/Group: Individual Therapy  Connor Small [facility] 04/20/2012, 4:58 PM

## 2012-04-20 NOTE — Progress Notes (Signed)
Chaplain Note:  Chaplain attempted to visit pt.  At the time of the visit, pt was in bed asleep.  Chaplain did not attempt to awaken pt.  Chaplain left note for pt and will follow up as needed.  04/20/12 1400  Clinical Encounter Type  Visited With Patient  Visit Type Spiritual support  Referral From Patient  Recommendations Pt requests daily visits for prayer  Spiritual Encounters  Spiritual Needs Other (Comment) (Pt was asleep at this time)  Stress Factors  Patient Stress Factors Health changes;Major life changes;Exhausted  Family Stress Factors None identified (No familuy present at this time)  Jearld Lesch, Chaplain [phone]

## 2012-04-20 NOTE — Progress Notes (Signed)
Patient ID: Connor Small, male   DOB: July 03, 1960, 52 y.o.   MRN: [id number] 52 y.o. right-handed male with history of ulcerative colitis as well as prior diverticular perforation, who was admitted to [facility] on 02/26/2012 with low back pain, fever 102.5 and multiple episodes of watery diarrhea. Underwent flexible sigmoidoscopy 02/27/2012 per Dr.Rehman with findings of multiple pseudomembranes consistent with C. difficile colitis as well as low colorectal stricture. Underwent exploratory laparotomy,cecostomy tube placement per Dr. Dian Situ 02/27/2012. 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 Post op he developed ARDS with septic shock as well as renal failure due to AKI. On 02/06 he developed new onset Atrial fibrillation with RVR and was transferred to [facility] for treatment. Dr. Lorie Phenix consulted for input on metabolic acidosis with anasarca and non-oliguric ARF likely due to ischemic ATN in setting of hypotension and CRRT initiated. He was started on stress dose steroids and was treated with broad spectrum antibiotics. Dr. Derrell Lolling consulted for input and with close monitoring of wound. Wound evisceration treated with placement of retention sutures and wound closure on 3/9 by Dr. Johna Sheriff.  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 Neurology consulted for input global weakness and proximal RUE weakness. 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 Ct cervical spine with degenerative changes and large left pleural effusion. Dr. Kathrynn Running consulted for possible ureteral injury due to high output from JP drain and cysto with pyelogram revealed no filling defects or extravasation. Dr. Madilyn Fireman consulted for input on abnormal LFTs. 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 Dr. Pearlean Brownie recommended ASA for now and anticoagulation once thrombocytopenia/LFTs resolve and patient able to swallow safely. Dr Ophelia Charter consulted due to right shoulder pain and inferior subluxation on xray. He felt inferior position due to RUE weakness and not dislocation He developed hypotension with GIB requiring multiple units PRBC on 3/10. He continues with high output from ileostomy with hypernatremia. Panda discontinued on 03/24 And follow up MBS done today due to dysphagia. Diet advanced to D3, thin liquids by tsp. He has had oral discomfort and was started on diflucan for thrush. Pain control remains an issue was placed on methadone.  Subjective/Complaints: No limb pain, mainly has reflux with throat pain, sm amt emesis   Objective: Vital Signs: Blood pressure 109/71, pulse 114, temperature 98.5 F (36.9 C), temperature source Oral, resp. rate 20, height 5\' 10"  (1.778 m), weight 71.94 kg (158 lb 9.6 oz), SpO2 96.00%.     Results for orders placed during the hospital encounter of 04/17/12 (from the past 72 hour(s))  FERRITIN     Status: Abnormal   Collection Time    04/18/12  5:25 AM      Result Value Range   Ferritin 857 (*) 22 - 322 ng/mL  FOLATE     Status: None   Collection Time    04/18/12  5:25 AM      Result Value Range   Folate 19.1     Comment: (NOTE)     Reference Ranges            Deficient:  0.4 - 3.3 ng/mL            Indeterminate:   3.4 - 5.4 ng/mL            Normal:              > 5.4 ng/mL  IRON AND TIBC     Status: Abnormal   Collection Time    04/18/12  5:25 AM      Result Value Range   Iron 17 (*) 42 - 135 ug/dL   TIBC 253 (*) 664 - 403 ug/dL   Saturation Ratios 12 (*) 20 - 55 %   UIBC 128  125 - 400 ug/dL   RETICULOCYTES     Status: Abnormal   Collection Time    04/18/12  5:25 AM      Result Value Range   Retic Ct Pct 1.4  0.4 - 3.1 %   RBC. 2.30 (*) 4.22 - 5.81 MIL/uL   Retic Count, Manual 32.2  19.0 - 186.0 K/uL  VITAMIN B12     Status: None   Collection Time    04/18/12  5:25 AM      Result Value Range   Vitamin B-12 336  211 - 911 pg/mL  CBC WITH DIFFERENTIAL     Status: Abnormal   Collection Time    04/18/12  5:25 AM      Result Value Range   WBC 9.4  4.0 - 10.5 K/uL   RBC 2.30 (*) 4.22 - 5.81 MIL/uL   Hemoglobin 7.2 (*) 13.0 - 17.0 g/dL   HCT 47.4 (*) 25.9 - 56.3 %   MCV 104.3 (*) 78.0 - 100.0 fL   MCH 31.3  26.0 - 34.0 pg   MCHC 30.0  30.0 - 36.0 g/dL   RDW 87.5 (*) 64.3 - 32.9 %   Platelets 275  150 - 400 K/uL   Neutrophils Relative 81 (*) 43 - 77 %   Neutro Abs 7.7  1.7 - 7.7 K/uL   Lymphocytes Relative 10 (*) 12 - 46 %   Lymphs Abs 1.0  0.7 - 4.0 K/uL   Monocytes Relative 7  3 - 12 %   Monocytes Absolute 0.6  0.1 - 1.0 K/uL   Eosinophils Relative 1  0 - 5 %   Eosinophils Absolute 0.1  0.0 - 0.7 K/uL   Basophils Relative 0  0 - 1 %   Basophils Absolute 0.0  0.0 - 0.1 K/uL  COMPREHENSIVE METABOLIC PANEL     Status: Abnormal   Collection Time    04/18/12  5:25 AM      Result Value Range   Sodium 157 (*) 135 - 145 mEq/L   Potassium 3.4 (*) 3.5 - 5.1 mEq/L   Chloride 126 (*) 96 - 112 mEq/L   CO2 21  19 - 32 mEq/L   Glucose, Bld 108 (*) 70 - 99 mg/dL   BUN 36 (*) 6 - 23 mg/dL   Creatinine, Ser 5.18  0.50 - 1.35 mg/dL   Calcium 8.4  8.4 - 84.1 mg/dL   Total Protein 5.3 (*) 6.0 - 8.3 g/dL   Albumin 1.5 (*) 3.5 - 5.2 g/dL   AST 27  0 - 37 U/L   ALT 39  0 - 53 U/L   Alkaline Phosphatase 341 (*) 39 - 117 U/L   Total Bilirubin 1.5 (*) 0.3 - 1.2 mg/dL   GFR calc non Af Amer 64 (*) >90 mL/min   GFR calc Af Denyse Dago  74 (*) >90 mL/min   Comment:            The eGFR has been calculated     using the CKD EPI equation.     This calculation has not been     validated in  all clinical     situations.     eGFR's persistently     <90 mL/min signify     possible Chronic Kidney Disease.  BASIC METABOLIC PANEL     Status: Abnormal   Collection Time    04/19/12  6:20 AM      Result Value Range   Sodium 156 (*) 135 - 145 mEq/L   Potassium 3.1 (*) 3.5 - 5.1 mEq/L   Chloride 124 (*) 96 - 112 mEq/L   CO2 23  19 - 32 mEq/L   Glucose, Bld 101 (*) 70 - 99 mg/dL   BUN 34 (*) 6 - 23 mg/dL   Creatinine, Ser 1.61 (*) 0.50 - 1.35 mg/dL   Calcium 8.6  8.4 - 09.6 mg/dL   GFR calc non Af Amer 58 (*) >90 mL/min   GFR calc Af Amer 67 (*) >90 mL/min   Comment:            The eGFR has been calculated     using the CKD EPI equation.     This calculation has not been     validated in all clinical     situations.     eGFR's persistently     <90 mL/min signify     possible Chronic Kidney Disease.  CBC     Status: Abnormal   Collection Time    04/19/12  6:20 AM      Result Value Range   WBC 10.3  4.0 - 10.5 K/uL   RBC 2.38 (*) 4.22 - 5.81 MIL/uL   Hemoglobin 7.4 (*) 13.0 - 17.0 g/dL   HCT 04.5 (*) 40.9 - 81.1 %   MCV 104.2 (*) 78.0 - 100.0 fL   MCH 31.1  26.0 - 34.0 pg   MCHC 29.8 (*) 30.0 - 36.0 g/dL   RDW 91.4 (*) 78.2 - 95.6 %   Platelets 317  150 - 400 K/uL  BASIC METABOLIC PANEL     Status: Abnormal   Collection Time    04/20/12  5:55 AM      Result Value Range   Sodium 147 (*) 135 - 145 mEq/L   Potassium 3.9  3.5 - 5.1 mEq/L   Chloride 115 (*) 96 - 112 mEq/L   CO2 22  19 - 32 mEq/L   Glucose, Bld 332 (*) 70 - 99 mg/dL   BUN 31 (*) 6 - 23 mg/dL   Creatinine, Ser 2.13 (*) 0.50 - 1.35 mg/dL   Calcium 8.2 (*) 8.4 - 10.5 mg/dL   GFR calc non Af Amer 54 (*) >90 mL/min   GFR calc Af Amer 63 (*) >90 mL/min   Comment:            The eGFR has been calculated     using the CKD EPI equation.     This calculation has not been     validated in all clinical     situations.     eGFR's persistently     <90 mL/min signify     possible Chronic Kidney Disease.      HEENT: thrush Cardio: RRR and tachy Resp: CTA B/L and unlabored GI: BS positive and Non distended Extremity:  No Edema  Skin:   Wound RLQ ileostomy with liquid output, ostomy site ok, Mild line retention sutures with Wet/dry 4/4 packing Neuro: Alert/Oriented, Anxious, Cranial Nerve II-XII normal, Normal Sensory and Abnormal Motor L delt 3-/5, bi,tri, grip 4-/5, R delt 1/5, R bi 0/5, R tri 3/5, R wrist ext 0/5, R hand intrinsics 0/5 abd, 3/5 add,2-/5 B HF, KE, 0/5 ankle DF, 3-/5 ankle eversion and PF Musc/Skel:  Other No pain with PROM of UE or LEs Gen:  anxious no acute distress 3+bilateral knee and 1+ bilateral ankle DTRs  Assessment/Plan: 1. 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 Physiatrist is providing close team supervision and 24 hour management of active medical problems listed below. Physiatrist and rehab team continue to assess barriers to discharge/monitor patient progress toward functional and medical goals. FIM: FIM - Bathing Bathing Steps Patient Completed: Chest Bathing: 1: Two helpers  FIM - Upper Body Dressing/Undressing Upper body dressing/undressing: 0: Wears gown/pajamas-no public clothing FIM - Lower Body Dressing/Undressing Lower body dressing/undressing: 0: Activity did not occur  FIM - Toileting Toileting: 1: Total-Patient completed zero steps, helper did all 3     FIM - Banker Devices: Sliding board;Bed rails;Orthosis Bed/Chair Transfer: 1: Mechanical lift;1: Two helpers  FIM - Locomotion: Wheelchair Distance: 150 in tilt in space w/c for positioning, pressure relief with head support and elevating LR to assist with BP control; full lap tray also placed for UE support Locomotion: Wheelchair: 1: Total Assistance/staff pushes wheelchair  (Pt<25%) FIM - Locomotion: Ambulation Ambulation/Gait Assistance: Not tested (comment) Locomotion: Ambulation: 0: Activity did not occur  Comprehension Comprehension Mode: Auditory Comprehension: 4-Understands basic 75 - 89% of the time/requires cueing 10 - 24% of the time  Expression Expression Mode: Verbal Expression: 4-Expresses basic 75 - 89% of the time/requires cueing 10 - 24% of the time. Needs helper to occlude trach/needs to repeat words.  Social Interaction Social Interaction Mode: Asleep Social Interaction: 3-Interacts appropriately 50 - 74% of the time - May be physically or verbally inappropriate.  Problem Solving Problem Solving Mode: Not assessed Problem Solving: 2-Solves basic 25 - 49% of the time - needs direction more than half the time to initiate, plan or complete simple activities  Memory Memory Mode: Not assessed Memory: 1-Recognizes or recalls less than 25% of the time/requires cueing greater than 75% of the time   Medical Problem List and Plan:  1. DVT Prophylaxis/Anticoagulation: Mechanical: Sequential compression devices, below knee Bilateral lower extremities thrombocytopenia resolved. 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 GIB 03/10.  -dopplers performed 3/7 were negative  2. Multifactorial pain: Stop fentanyl, methadone,  MS Contin CR 15mg  Q12h, Cont MSIR 15mg  q4H prn Abdominal distention/ discomfort continues. Add protonix 3. Mood: Distracted due to multiple medical issues. Underlying cognitive issues also clouding the picture. Will need a lot of ego support by the team. Neuropsychologist and LCSW to follow additionally for evaluation and support once patient gets into a routine.  4. Neuropsych: This patient is not capable of making decisions on his/her own behalf.  5. Hypernatremia: due to high volume losses. Will add HS IV hydration as doubt he'll be able to maintain adequate hydration on liquids by tsp. Change IVF to D5W with KCL.  6.  Bilateral cardioembolic CVA: no blood thinners due to issues with bleeding?  7. ABLA: will continue to monitor H/H and transfuse for hgb <7.0. Currently  ranging from 7.6- 8.4 range. Will check anemia panel. MCV 102. May benefit from aranesp.  8. Critical illness neuropathy/myopathy-  Protect heels, stretch heel cords  -AFO's at some point when mobility justifies them 9.  Toxic megacolon s/p total colectomy.  Has high output ileostomy 10.Marland Kitchen Hypernatremia and hyperchloremia due to high output ileostomy 11.  Hypo K will supplement via IVF  LOS (Days) 3 A FACE TO FACE EVALUATION WAS PERFORMED  Keyston Ardolino E 04/20/2012, 8:41 AM

## 2012-04-20 NOTE — Progress Notes (Signed)
Wound improving Patient examined and I agree with the assessment and plan  Georganna Skeans, MD, MPH, FACS Pager: [phone]  04/20/2012 4:06 PM

## 2012-04-20 NOTE — Progress Notes (Signed)
Speech Language Pathology Daily Session Note  Patient Details  Name: Connor Small MRN: [id number] Date of Birth: 1960-09-21  Today's Date: 04/20/2012 Time: 4081-4481 Time Calculation (min): 45 min  Short Term Goals: Week 1: SLP Short Term Goal 1 (Week 1): Patient will recall and utilize safe swallow compensatory strategies with mod assist verbal cues. SLP Short Term Goal 2 (Week 1): Patient will complete oropharyngeal strengthening exercises x10 each with moderate verbal cues.   SLP Short Term Goal 3 (Week 1): Patient will sustian attention to basic task for 3 minutes with with mod assist verbal cues for re-direction.  SLP Short Term Goal 4 (Week 1): Patient will utilize external aids to recall daily information with mod assist verbal cues.  Skilled Therapeutic Interventions: Treatment session focused on addressing dysphagia goals.  SLP facilitated session with set up assist max assist question cues to direct SLP to assist him.  Patient often underestimated abilities and requested help to do things he could do for himself.  Patient consumed Dys.3 textures and thin liquids via cup with teaspoon size sips and min assist physical cues to self feed finger foods and hand over hand assist to utilize utensils to self feed with right upper extremity support.  SLP also facilitated session with mod  assist verbal cues to carryover safe swallow strategies throughout session.  At end of session patient vomited and RN was notified.     FIM:  Comprehension Comprehension Mode: Auditory Comprehension: 3-Understands basic 50 - 74% of the time/requires cueing 25 - 50%  of the time Expression Expression Mode: Verbal Expression: 4-Expresses basic 75 - 89% of the time/requires cueing 10 - 24% of the time. Needs helper to occlude trach/needs to repeat words. Social Interaction Social Interaction: 4-Interacts appropriately 75 - 89% of the time - Needs redirection for appropriate language or to initiate  interaction. Problem Solving Problem Solving: 2-Solves basic 25 - 49% of the time - needs direction more than half the time to initiate, plan or complete simple activities Memory Memory: 1-Recognizes or recalls less than 25% of the time/requires cueing greater than 75% of the time FIM - Eating Eating Activity: 2: Hand over hand assist (when using spoon)  Pain Pain Assessment Pain Assessment:  (N/V)  Therapy/Group: Individual Therapy  Carmelia Roller., [place] [phone]  [place] 04/20/2012, 1:04 PM

## 2012-04-20 NOTE — Progress Notes (Signed)
NUTRITION FOLLOW UP  DOCUMENTATION CODES  Per approved criteria   -Severe malnutrition in the context of acute illness or injury    Intervention:   1. MVI daily  2. Resource Breeze po prn, each supplement provides 250 kcal and 9 grams of protein.  3. Ensure Complete po prn (for med pass), each supplement provides 350 kcal and 13 grams of protein.  4. Discontinue 30 ml Prostat po BID, pt does not like this supplement.  5. Discussed oral intake extensively with wife at bedside  6. RD to continue to follow nutrition care plan  Nutrition Dx:   Inadequate oral intake related to swallowing difficulty as evidenced by dietary recall and ongoing weight loss. Ongoing.  Goal:   Intake to meet >90% of estimated nutrition needs. Unmet.  Monitor:   weight trends, lab trends, I/O's, PO intake, supplement tolerance  Assessment:   Hx of ulcerative colitis and diverticular perforation. Admitted to [facility] on 2/2 with low back pain, fever, and multiple episodes of watery diarrhea. Work-up revealed multiple pseudomembranes consistent with C. difficile colitis and low colorectal stricture. Underwent exploratory laparotomy with cecostomy tube placement 2/3. 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 Post-op he developed ARDS with septic shock as well as renal failure due to AKI. On 2/6 he developed new onset a-fib with RVR and was transferred to [facility]. Renal consulted for metabolic acidosis with anasarca and non-oliguric ARF likely due to ischemic ATN in setting of hypotension. CRRT initiated. He was started on stress dose steroids and was treated with broad spectrum antibiotics. Wound evisceration treated with placement of retention sutures and wound closure. 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 Ct cervical spine with degenerative changes and large left pleural effusion.  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 He developed hypotension with GIB requiring multiple units PRBC on 3/10. He continues with high output from ileostomy with hypernatremia. Panda discontinued on 3/24; f/u MBS done yesterday with recommendations of Dysphagia 3 diet with thin liquids by tsp. He has had oral discomfort and was started on diflucan for thrush. Pain control remains an issue.  Pt followed by RD staff during acute hospitalization. Pt with variable wt since admission. Admission wt 200 lbs, wife reports that this is patient's baseline weight. Pt underwent permissive underfeeding x 3 weeks as appropriate for critically ill obese pt and initiated advancement to full feeds (2/21-2/26). Feeds were held for surgery (2/26-3/3), resumed with pt reaching goal rate (3/4). TFs again held (3/11) for GI bleed, resumed (3/12). Pt was dx with severe malnutrition of acute illness by RD 2 days ago given 17.5% wt loss in >1 month and PO/nutrition support meeting </=75% of estimated needs for >1 month. Continues to meet criteria for this at this time.  Continues with order for 30 ml Prostat PO BID; Resource Breeze and Ensure Complete discontinued by PA on 3/27. Wife confirms that pt is not taking Prostat supplement well - he vomited it after receiving it yesterday. Will discontinue. Wife notes that the patient likes Resource Breeze orange flavor supplement. Meal intake remains poor and pt is vomiting after meals, per wife.  Height: Ht Readings from Last 1 Encounters:  04/17/12 5' 10"  (1.778 m)    Weight Status:   Wt Readings from Last 1 Encounters:  04/18/12 158 lb 9.6 oz (71.94  kg)  Wt down 10 lb x 1 day??  Re-estimated needs:  Kcal: 2200 - 2400 Protein: 140 - 160 grams Fluid: at least 2.3 liters daily  Skin: stage II R arm, stage I sacrum  Diet Order: Dysphagia 3; thin  liquids   Intake/Output Summary (Last 24 hours) at 04/20/12 1125 Last data filed at 04/20/12 0655  Gross per 24 hour  Intake    720 ml  Output    850 ml  Net   -130 ml    Last BM: 3/27   Labs:   Recent Labs Lab 04/18/12 0525 04/19/12 0620 04/20/12 0555  NA 157* 156* 147*  K 3.4* 3.1* 3.9  CL 126* 124* 115*  CO2 21 23 22   BUN 36* 34* 31*  CREATININE 1.25 1.36* 1.43*  CALCIUM 8.4 8.6 8.2*  GLUCOSE 108* 101* 332*    CBG (last 3)  No results found for this basename: GLUCAP,  in the last 72 hours  Scheduled Meds: . aspirin EC  325 mg Oral Daily  . chlorhexidine  15 mL Mouth Rinse BID  . dextrose 5 % 1,000 mL with potassium chloride 20 mEq infusion   Intravenous Custom  . feeding supplement  30 mL Oral q12n4p  . fluconazole  100 mg Oral Daily  . insulin aspart  0-5 Units Subcutaneous QHS  . insulin aspart  0-9 Units Subcutaneous TID WC  . lidocaine  1 patch Transdermal Q24H  . pantoprazole  40 mg Oral Daily  . saccharomyces boulardii  250 mg Oral BID  . sucralfate  1 g Oral TID WC & HS  . traZODone  50 mg Oral QHS    Continuous Infusions:  none  Inda Coke MS, RD, LDN Pager: [phone] After-hours pager: [phone]

## 2012-04-20 NOTE — Plan of Care (Signed)
Problem: RH BOWEL ELIMINATION Goal: RH STG MANAGE BOWEL WITH ASSISTANCE STG Manage Bowel with min Assistance.  Outcome: Progressing Pt with ileostomy, wife and staff managing

## 2012-04-20 NOTE — Progress Notes (Signed)
Patient vomited a moderate amount of emesis. Zofran 8 mg given with good effect noted. Maalox 15 mg  given for heartburn with good relief noted.  Patient's wife at bedside at this time. Denies any pain. Wet to dry dressing changed to mid abdominal incision.Patient tolerated well. Call bell within reach.Will continue to monitor.

## 2012-04-20 NOTE — Plan of Care (Signed)
Problem: RH BLADDER ELIMINATION Goal: RH STG MANAGE BLADDER WITH EQUIPMENT WITH ASSISTANCE STG Manage Bladder With Equipment With min Assistance  Outcome: Not Progressing Pt's wife or staff and hold and remove and empty urinal

## 2012-04-20 NOTE — Plan of Care (Signed)
Problem: RH BOWEL ELIMINATION Goal: RH STG MANAGE BOWEL W/EQUIPMENT W/ASSISTANCE STG Manage Bowel With Equipment With min Assistance  Outcome: Not Progressing Wife and staff managing

## 2012-04-20 NOTE — Progress Notes (Signed)
Wife with multiple concerns regarding patient's nausea. Feels that he's getting multiple medications that are causing his symptoms. Will discontinue diflucan. Increase protonix to bid as on acute. N/V past therapy likely due to increase in activity level and deconditioning.

## 2012-04-21 ENCOUNTER — Inpatient Hospital Stay (HOSPITAL_COMMUNITY): Payer: BC Managed Care – PPO

## 2012-04-21 ENCOUNTER — Inpatient Hospital Stay (HOSPITAL_COMMUNITY): Payer: BC Managed Care – PPO | Admitting: Speech Pathology

## 2012-04-21 DIAGNOSIS — J189 Pneumonia, unspecified organism: Secondary | ICD-10-CM

## 2012-04-21 DIAGNOSIS — Y95 Nosocomial condition: Secondary | ICD-10-CM | POA: Diagnosis not present

## 2012-04-21 DIAGNOSIS — J90 Pleural effusion, not elsewhere classified: Secondary | ICD-10-CM

## 2012-04-21 DIAGNOSIS — D696 Thrombocytopenia, unspecified: Secondary | ICD-10-CM

## 2012-04-21 DIAGNOSIS — G8250 Quadriplegia, unspecified: Secondary | ICD-10-CM

## 2012-04-21 HISTORY — DX: Nosocomial condition: Y95

## 2012-04-21 HISTORY — DX: Pneumonia, unspecified organism: J18.9

## 2012-04-21 LAB — GLUCOSE, CAPILLARY
Glucose-Capillary: 108 mg/dL — ABNORMAL HIGH (ref 70–99)
Glucose-Capillary: 116 mg/dL — ABNORMAL HIGH (ref 70–99)
Glucose-Capillary: 103 mg/dL — ABNORMAL HIGH (ref 70–99)

## 2012-04-21 LAB — URINALYSIS, ROUTINE W REFLEX MICROSCOPIC
Ketones, ur: NEGATIVE mg/dL
Protein, ur: 30 mg/dL — AB
Urobilinogen, UA: 0.2 mg/dL (ref 0.0–1.0)

## 2012-04-21 LAB — CBC WITH DIFFERENTIAL/PLATELET
Basophils Absolute: 0 10*3/uL (ref 0.0–0.1)
Basophils Relative: 0 % (ref 0–1)
Eosinophils Relative: 0 % (ref 0–5)
HCT: 24.7 % — ABNORMAL LOW (ref 39.0–52.0)
Lymphs Abs: 0.9 10*3/uL (ref 0.7–4.0)
MCV: 98 fL (ref 78.0–100.0)
Monocytes Relative: 5 % (ref 3–12)
Neutro Abs: 13.1 10*3/uL — ABNORMAL HIGH (ref 1.7–7.7)
RDW: 17.9 % — ABNORMAL HIGH (ref 11.5–15.5)
WBC: 14.7 10*3/uL — ABNORMAL HIGH (ref 4.0–10.5)

## 2012-04-21 LAB — COMPREHENSIVE METABOLIC PANEL
Alkaline Phosphatase: 297 U/L — ABNORMAL HIGH (ref 39–117)
BUN: 32 mg/dL — ABNORMAL HIGH (ref 6–23)
CO2: 20 mEq/L (ref 19–32)
Chloride: 117 mEq/L — ABNORMAL HIGH (ref 96–112)
Creatinine, Ser: 1.61 mg/dL — ABNORMAL HIGH (ref 0.50–1.35)
GFR calc non Af Amer: 47 mL/min — ABNORMAL LOW (ref >90)
Total Bilirubin: 1.5 mg/dL — ABNORMAL HIGH (ref 0.3–1.2)

## 2012-04-21 LAB — URINE MICROSCOPIC-ADD ON

## 2012-04-21 MED ORDER — AZTREONAM 1 G IJ SOLR
1.0000 g | Freq: Three times a day (TID) | INTRAMUSCULAR | Status: DC
  Filled 2012-04-21 (×4): qty 1

## 2012-04-21 MED ORDER — ACETAMINOPHEN 650 MG RE SUPP
650.0000 mg | RECTAL | Status: DC | PRN
  Administered 2012-04-21 – 2012-04-24 (×5): 650 mg via RECTAL
  Filled 2012-04-21 (×7): qty 1

## 2012-04-21 MED ORDER — DEXTROSE 5 % IV SOLN
1.0000 g | Freq: Once | INTRAVENOUS | Status: AC
  Administered 2012-04-21: 1 g via INTRAVENOUS
  Filled 2012-04-21: qty 1

## 2012-04-21 MED ORDER — ONDANSETRON HCL 4 MG/2ML IJ SOLN
8.0000 mg | Freq: Once | INTRAMUSCULAR | Status: AC
  Administered 2012-04-21: 8 mg via INTRAVENOUS
  Filled 2012-04-21: qty 4

## 2012-04-21 MED ORDER — ASPIRIN 325 MG PO TABS
325.0000 mg | ORAL_TABLET | Freq: Every day | ORAL | Status: DC
  Administered 2012-04-21: 325 mg via ORAL
  Filled 2012-04-21 (×5): qty 1

## 2012-04-21 MED ORDER — POTASSIUM CHLORIDE 2 MEQ/ML IV SOLN
INTRAVENOUS | Status: DC
  Administered 2012-04-21 – 2012-04-23 (×4): via INTRAVENOUS
  Filled 2012-04-21 (×5): qty 1000

## 2012-04-21 MED ORDER — DEXTROSE 5 % IV SOLN
1.0000 g | Freq: Three times a day (TID) | INTRAVENOUS | Status: DC
  Administered 2012-04-21 – 2012-04-25 (×13): 1 g via INTRAVENOUS
  Filled 2012-04-21 (×15): qty 1

## 2012-04-21 MED ORDER — PANTOPRAZOLE SODIUM 40 MG IV SOLR
40.0000 mg | Freq: Two times a day (BID) | INTRAVENOUS | Status: DC
  Administered 2012-04-21 – 2012-04-25 (×8): 40 mg via INTRAVENOUS
  Filled 2012-04-21 (×12): qty 40

## 2012-04-21 MED ORDER — VANCOMYCIN HCL IN DEXTROSE 1-5 GM/200ML-% IV SOLN
1000.0000 mg | Freq: Two times a day (BID) | INTRAVENOUS | Status: DC
  Administered 2012-04-21 – 2012-04-22 (×3): 1000 mg via INTRAVENOUS
  Filled 2012-04-21 (×4): qty 200

## 2012-04-21 MED ORDER — ACETAMINOPHEN 500 MG PO TABS
500.0000 mg | ORAL_TABLET | Freq: Four times a day (QID) | ORAL | Status: DC | PRN
  Filled 2012-04-21: qty 1

## 2012-04-21 NOTE — Progress Notes (Signed)
Speech Language Pathology Daily Session Note  Patient Details  Name: Connor Small MRN: [id number] Date of Birth: October 07, 1960  Today's Date: 04/21/2012 Time: 0800-0820 Time Calculation (min): 20 min  Short Term Goals: Week 1: SLP Short Term Goal 1 (Week 1): Patient will recall and utilize safe swallow compensatory strategies with mod assist verbal cues. SLP Short Term Goal 1 - Progress (Week 1): Progressing toward goal SLP Short Term Goal 2 (Week 1): Patient will complete oropharyngeal strengthening exercises x10 each with moderate verbal cues.   SLP Short Term Goal 2 - Progress (Week 1): Progressing toward goal SLP Short Term Goal 3 (Week 1): Patient will sustian attention to basic task for 3 minutes with with mod assist verbal cues for re-direction.  SLP Short Term Goal 3 - Progress (Week 1): Progressing toward goal SLP Short Term Goal 4 (Week 1): Patient will utilize external aids to recall daily information with mod assist verbal cues. SLP Short Term Goal 4 - Progress (Week 1): Progressing toward goal  Skilled Therapeutic Interventions: Patient seen for dysphagia; however he was actively vomiting, and session abbreviated.  He did try to consume one ice chip.  No overt s/s of aspiration noted. Consumption of ice chip induced the vomiting, and therapy was stopped.   He was advised to sit closer to 90 degrees when consuming p.o.'s.  Nurse aware, and anti-nausea medication given.    FIM:  Comprehension Comprehension Mode: Auditory Comprehension: 5-Follows basic conversation/direction: With extra time/assistive device Expression Expression Mode: Verbal Expression: 5-Expresses basic 90% of the time/requires cueing < 10% of the time. Social Interaction Social Interaction Mode: Not assessed Problem Solving Problem Solving Mode: Not assessed Memory Memory Mode: Not assessed  Pain Pain Assessment Pain Assessment: No/denies pain Pain Intervention(s): Medication (See eMAR) (653m tylenol  supp given for elevated temp 102.4)  Therapy/Group: Individual Therapy  SFrances Maywood3/29/2014, 8:45 AM

## 2012-04-21 NOTE — Consult Note (Signed)
PULMONARY  / CRITICAL CARE MEDICINE  Name: Connor Small MRN: [id number] DOB: Nov 20, 1960    ADMISSION DATE:  04/17/2012 CONSULTATION DATE: 3/29  REFERRING MD :  Dr Cato Mulligan PRIMARY SERVICE:  Possible asp   ADMISSION DATE: 02/26/2012  CONSULTATION DATE: 2/5  REFERRING MD : Karilyn Cota  CHIEF COMPLAINT: Acute resp failure   BRIEF PATIENT DESCRIPTION:  69 YOM admitted to [facility] for Cdiff on 2/2. Underwent decompressive cecostomy 2/3, initially was better, then worsened on 2/4 with increased abdominal distension and tenderness, decreased renal function. He was taken back to the OR on 2/4 for total abdominal colectomy and end ileostomy. Post op he developed ARDS and sepsis shock and was being treated for this. The morning of 2/6 he had new onset of A-fib and transferred to [facility].   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 Overlying bowel gas and patientclinical status. 2. No explanation for elevated liver function tests.3. Similar to slight increase in size of a perisplenic fluidcollection.4. Right pleural effusion  2/20- reduction pressors, improved alertness  2/21- bleeding overnight from  JP's, argatroban turned off, lower pressor needs  2/22- off pressors (yeah), on RA (yeah)  2/23- neg from JPs again, inr reducing  2/24- concern dehiscence , some improved renal fxn with increased volume  2/24- possible dye change in output after dye 1 hr  2/25-relative brady  2/25 cysto neg injury  3/28 Called back to see on rehab for ? Asp   LINES / TUBES:  ETT - OSH 2/4>>>  Foley-OSH 2/3>>>  PICC OSH 2/4>>>2/11  Art Line - OSH 2/5>>>out  A line rt fem 2/18>>>2/24  Rt ij HD 2/17>>>  Left IJ 2/18>>>   CULTURES:  BCx2 2/4>>>negative  BCx2 2/3>>>negative  UC 2/3- Negative  MRSA PCR 2/3- Negative  C diff 2/6>>>Positive  Body fluid 2/9>>>WBCs, no organisms  2/15 BC x 2 >  2/15 Sputum >>>NF   ANTIBIOTICS:  Flagyl 2/3>>>  Vanc 2/6 (IV) >>>2/7  azactam 2/6>>>2/7 Oral vanc 2/6>>>  IV vanc 2/16 >2/24  Aztreonam 2/16 >2/17  Imipenem 2/17>>>2/24  mycofungin 2/18>>>2/24      PAST MEDICAL HISTORY :  Past Medical History  Diagnosis Date  . Chronic diarrhea   . Rectal bleed   . Hemorrhoids   . Ulcerative colitis     Distal UC over 8 yrs ago diagnosed  . Diverticulitis of large intestine with perforation 10/2011    done at [facility]   Past Surgical History  Procedure Laterality Date  . Colonoscopy  9/03  . Sigmoidoscopy       06/13/2002  . Temporary ostomy November 06, 2010  for a colon perforation that was done in [facility] (Dr Ruben Im).    . Colonoscopy  02/16/2011    Procedure: COLONOSCOPY;  Surgeon: Malissa Hippo, MD;  Location: [facility];  Service: Endoscopy;  Laterality: N/A;  100  . Flexible sigmoidoscopy  02/27/2012    Procedure: FLEXIBLE SIGMOIDOSCOPY;  Surgeon: Malissa Hippo, MD;  Location: [facility];  Service: Endoscopy;  Laterality: N/A;  with colonic decompression  . Laparotomy  02/27/2012    Procedure: EXPLORATORY LAPAROTOMY;  Surgeon: Fabio Bering, MD;  Location: [facility] ORS;  Service: General;  Laterality: N/A;  . Cecostomy  02/27/2012     Procedure: CECOSTOMY;  Surgeon: Fabio Bering, MD;  Location: [facility] ORS;  Service: General;  Laterality: N/A;  Cecostomy Tube Placement  . Colectomy  02/28/2012    Procedure: TOTAL COLECTOMY;  Surgeon: Fabio Bering, MD;  Location: [facility] ORS;  Service: General;  Laterality: N/A;  . Ileostomy  02/28/2012    Procedure: ILEOSTOMY;  Surgeon: Fabio Bering, MD;  Location: [facility] ORS;  Service: General;  Laterality: N/A;  . Cystoscopy w/ ureteral stent placement Bilateral 03/20/2012    Procedure: CYSTOSCOPY WITH RETROGRADE PYELOGRAM/URETERAL STENT PLACEMENT;  Surgeon: Sebastian Ache, MD;  Location: [facility] OR;  Service: Urology;  Laterality: Bilateral;  . Laparotomy N/A 03/26/2012    Procedure: EXPLORATORY LAPAROTOMY;  Surgeon: Mariella Saa, MD;  Location: [facility] OR;  Service: General;  Laterality: N/A;  . Application of wound vac N/A 03/26/2012    Procedure: APPLICATION OF WOUND VAC;  Surgeon: Mariella Saa, MD;  Location: [facility] OR;  Service: General;  Laterality: N/A;  . Liver biopsy N/A 03/26/2012    Procedure: LIVER BIOPSY;  Surgeon: Mariella Saa, MD;  Location: [facility] OR;  Service: General;  Laterality: N/A;  . Laparotomy N/A 03/29/2012    Procedure: EXPLORATORY LAPAROTOMY, PARTIAL WOUND CLOSURE;  Surgeon: Mariella Saa, MD;  Location: [facility] OR;  Service: General;  Laterality: N/A;  . Vacuum assisted closure change N/A 03/29/2012    Procedure: Open ABDOMINAL VACUUM  CHANGE;  Surgeon: Mariella Saa, MD;  Location: [facility] OR;  Service: General;  Laterality: N/A;  . Vacuum assisted closure change N/A 04/01/2012    Procedure: removal of abdominal vac dressing and abdominal closure;  Surgeon: Mariella Saa, MD;  Location: [facility] OR;  Service: General;  Laterality: N/A;   Prior to Admission medications   Medication Sig Start Date End Date Taking? Authorizing Provider  bifidobacterium infantis (ALIGN) capsule Take 1 capsule by mouth daily. 07/18/11 07/17/12  Malissa Hippo, MD  FORTESTA 10 MG/ACT (2%) GEL at  bedtime. Patient states that he applies it to his leg 12/05/11   Historical Provider, MD  ibuprofen (ADVIL,MOTRIN) 200 MG tablet Take 400 mg by mouth 2 (two) times daily as needed. For pain    Historical Provider, MD  Mesalamine (ASACOL HD) 800 MG TBEC Take 2 tablets (1,600 mg total) by mouth 2 (two) times daily. 01/16/12   Malissa Hippo, MD  Pseudoeph-Doxylamine-DM-APAP (NYQUIL D COLD/FLU) 60-12.06-22-998 MG/30ML LIQD Take 30 mLs by mouth at bedtime. Cold/flu symptoms    Historical Provider, MD  Pseudoephedrine-APAP-DM (DAYQUIL MULTI-SYMPTOM COLD/FLU PO) Take 1 capsule by mouth every 4 (four) hours as needed. Cold symptoms    Historical Provider, MD   Allergies  Allergen Reactions  . Penicillins Other (See Comments)    Heart rate changes  . Imipenem Rash    FAMILY HISTORY:  Family History  Problem Relation Age of  Onset  . Cancer Sister   . Healthy Daughter   . Hypertension Mother    SOCIAL HISTORY:  reports that he quit smoking about 27 years ago. His smoking use included Cigarettes. He smoked 0.00 packs per day. He has never used smokeless tobacco. He reports that he does not drink alcohol or use illicit drugs.     SUBJECTIVE:  No response to verbal   VITAL SIGNS: Temp:  [98.2 F (36.8 C)-102.4 F (39.1 C)] 99.4 F (37.4 C) (03/29 0900) Pulse Rate:  [106-140] 119 (03/29 1300) Resp:  [18-20] 18 (03/29 0620) BP: (94-97)/(60-67) 96/67 mmHg (03/29 0522) SpO2:  [92 %-98 %] 96 % (03/29 0620)    INTAKE / OUTPUT: Intake/Output     03/28 0701 - 03/29 0700 03/29 0701 - 03/30 0700   P.O. 320 10   I.V. (mL/kg) 140 (1.9)    Total Intake(mL/kg) 460 (6.4) 10 (0.1)   Urine (mL/kg/hr) 325 (0.2)    Emesis/NG output 25 (0) 30 (0.1)   Stool 1150 (0.7) 55 (0.1)   Total Output 1500 85   Net -1040 -75        Urine Occurrence 1 x      PHYSICAL EXAMINATION: General  Very debilitated lies in bed slumped to R nad on RA Neck supple No jvd Lungs clear RRR no s3  abd soft Ext in  splints  LABS:  Recent Labs Lab 04/14/12 1827  04/16/12 0549 04/18/12 0525 04/19/12 0620 04/20/12 0555 04/21/12 0840  HGB  --   < > 7.6* 7.2* 7.4*  --  7.8*  WBC  --   < > 8.8 9.4 10.3  --  14.7*  PLT  --   < > 212 275 317  --  350  NA  --   < > 155* 157* 156* 147* 148*  K  --   < > 3.5 3.4* 3.1* 3.9 3.8  CL  --   < > 124* 126* 124* 115* 117*  CO2  --   < > 22 21 23 22 20   GLUCOSE  --   < > 124* 108* 101* 332* 113*  BUN  --   < > 45* 36* 34* 31* 32*  CREATININE  --   < > 1.25 1.25 1.36* 1.43* 1.61*  CALCIUM  --   < > 8.5 8.4 8.6 8.2* 8.5  AST  --   --  31 27  --   --  19  ALT  --   --  47 39  --   --  24  ALKPHOS  --   --  378* 341*  --   --  297*  BILITOT  --   --  1.8* 1.5*  --   --  1.5*  PROT  --   --  5.0* 5.3*  --   --  5.6*  ALBUMIN  --   --  1.5* 1.5*  --   --  1.6*  TROPONINI <0.30  --   --   --   --   --   --   < > = values in this interval not displayed.  Recent Labs Lab 04/20/12 1126 04/20/12 1619 04/20/12 2218 04/21/12 0727 04/21/12 1117  GLUCAP 93 86 83 108* 165*    CXR 3/29 1. New airspace opacity at the right lung base, favoring  pneumonia. Aspiration pneumonitis is not excluded.  2. Continued large left pleural effusion with associated left  sided airspace opacity favoring atelectasis.   ASSESSMENT /  PLAN: PULMONARY  A: Acute respiratory failure in setting of septic shock and volume overload > resolved Lt pleural effusion.  Extubated 2/20  New R as dz 3/29 c/w asp vs HCAP P:  See ID section        INFECTIOUS  A: C diff colitis, resolved clinically A  New asp vs HCAP 3/29 Agree with rx per primary svc   ENDOCRINE  A: Hyperglycemia. Controlled  Relative adrenal insufficiency.  P:  Per primary svc  NEUROLOGIC  A: Acute encephalopathy  R/o stroke vs crit illness myopathy / neuroapthy  Will need MRI brain and neck, wait till safe for this     Discussed with wife in detail at bedside.  Nothing at this point for pccm svc to offer  and does not need trx back to our service at this point   Sandrea Hughs, MD Pulmonary and Critical Care Medicine [facility] Cell [phone] After 5:30 PM or weekends, call [phone]

## 2012-04-21 NOTE — Progress Notes (Addendum)
Patient's wife refusing oral medication for now, states that it might be causing him to vomit. Yellowish vomit 3x today small to moderate amount. Patient unable to keep medication down. Slept most of the day, wakes up when family/friends come to visit. Afebrile with HR down to 116 from 130. Will continue to monitor.

## 2012-04-21 NOTE — Significant Event (Signed)
Rapid Response Event Note  Called by primary RN to see pt for fever & family request. Overview: Time Called: 0500 Arrival Time: 0503 Event Type: Other (Comment)  Initial Focused Assessment: On assessment pt is lethargic but arousable. HR 130 ST, BP 96/46, RR 22, Sats 94% RA, abd distended & slightly tender. No appreciable edema in extremities.  Per wife pt is having increased dependent edema & had been vomiting throughout the night.  Wife is very distressed & difficult to communicate with due to this distress.  Interventions: PCXR, Port KUB, Blood culture x2, tylenol 635m pr, zofran 874mIV now dosing, CBC with diff, CMP  Event Summary: Name of Physician Notified: Dr. FeTitus Mouldt [phone] of Consulting Physician Notified: Dr. PlBronwen Betterst [phone]: Stayed in room and stabalized  I spoke with Dr. PlAlain Marionprimary team, & updated him regarding pt condition & wife's insistence that Dr. FeTitus Moulde contacted. Orders rcvd for zofran & pr tylenol.  Dr. DeJimmy Footmanith CCM contacted to update & Dr. FeTitus Mouldontacted.  New orders rcvd & CCM night coverage will follow-up with xray results.  Pt's wife updated.  Connor Small

## 2012-04-21 NOTE — Progress Notes (Signed)
Patient ID: Connor Small, male   DOB: [birth year], 52 y.o.   MRN: [id number] 52 y.o. right-handed male with history of ulcerative colitis as well as prior diverticular perforation, who was admitted to [facility] on 02/26/2012 with low back pain, fever 102.5 and multiple episodes of watery diarrhea. Underwent flexible sigmoidoscopy 02/27/2012 per Dr.Rehman with findings of multiple pseudomembranes consistent with C. difficile colitis as well as low colorectal stricture. Underwent exploratory laparotomy,cecostomy tube placement per Dr. Dian Situ 02/27/2012. 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 Post op he developed ARDS with septic shock as well as renal failure due to AKI. On 02/06 he developed new onset Atrial fibrillation with RVR and was transferred to [facility] for treatment. Dr. Lorie Phenix consulted for input on metabolic acidosis with anasarca and non-oliguric ARF likely due to ischemic ATN in setting of hypotension and CRRT initiated. He was started on stress dose steroids and was treated with broad spectrum antibiotics. Dr. Derrell Lolling consulted for input and with close monitoring of wound. Wound evisceration treated with placement of retention sutures and wound closure on 3/9 by Dr. Johna Sheriff.  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 Neurology consulted for input global weakness and proximal RUE weakness. 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 Ct cervical spine with degenerative changes and large left pleural effusion. Dr. Kathrynn Running consulted for possible ureteral injury due to high output from JP drain and cysto with pyelogram revealed no filling defects or extravasation. Dr. Madilyn Fireman consulted for input on abnormal LFTs. 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 Dr. Pearlean Brownie recommended ASA for now and anticoagulation once thrombocytopenia/LFTs resolve and patient able to swallow safely. Dr Ophelia Charter consulted due to right shoulder pain and inferior subluxation on xray. He felt inferior position due to RUE weakness and not dislocation He developed hypotension with GIB requiring multiple units PRBC on 3/10. He continues with high output from ileostomy with hypernatremia. Panda discontinued on 03/24 And follow up MBS done today due to dysphagia. Diet advanced to D3, thin liquids by tsp. He has had oral discomfort and was started on diflucan for thrush. Pain control remains an issue was placed on methadone.  Subjective/Complaints: Feels weaker today Not overnight fever Denies SOB or cough  Wife and dtr in room. Wife has multiple concerns and questions. 1) nausea- thinks might be related to multiple medications given at one time 2) concerned that florastor is given as capsule and concerned that it is sprinkled on food 3) concerned with new diagnosis of pneumonia- explained that this could be aspiration or could be HCAPS 4)Concerned with ileostomy output 5)concerned with fluid intake and IVF   Objective: Vital Signs: Blood pressure 96/67, pulse 123, temperature 99.4 F (37.4 C), temperature source Oral, resp. rate 18, height 5\' 10"  (1.778 m), weight 158 lb 9.6 oz (71.94 kg), SpO2 96.00%.  Chronically ill appearing male, thin Heent: AT/[place], no trauma Neck supple withotu JVD Chest-- decrease breath sounds on the left CV- HR 120 BPM abd- thin, soft, NT EXT: no significant edema Neuro: alert and talkative  Assessment/Plan: 1. 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 DVT Prophylaxis/Anticoagulation: Mechanical: Sequential compression devices, below knee Bilateral lower extremities thrombocytopenia resolved. 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 GIB 03/10.  -dopplers performed 3/7 were negative  2. Multifactorial pain: Stop fentanyl, methadone,  MS Contin CR 15mg  Q12h, Cont MSIR 15mg  q4H prn Abdominal distention/ discomfort continues. Add protonix 3. Mood: he appears to be intentional and slow about thoughts. He is aware of hospitalization  4. Neuropsych: This patient is not capable of making decisions on his/her own behalf.  5. Hypernatremia: due to high volume losses.  have increased IVF to 24 hours/day.  6. Bilateral cardioembolic CVA: no blood thinners due to issues with bleeding?  7. ABLA: will continue to monitor H/H and transfuse for hgb <7.0. Currently ranging from 7.6- 8.4 range. Will check anemia panel. MCV 102. May benefit from aranesp.  CBC:    Component Value Date/Time   WBC 14.7* 04/21/2012 0840   HGB 7.8* 04/21/2012 0840   HCT 24.7* 04/21/2012 0840   PLT 350 04/21/2012 0840   MCV 98.0 04/21/2012 0840   NEUTROABS 13.1* 04/21/2012 0840   LYMPHSABS 0.9 04/21/2012 0840   MONOABS 0.7 04/21/2012 0840   EOSABS 0.0 04/21/2012 0840   BASOSABS 0.0 04/21/2012 0840   8. Critical illness neuropathy/myopathy-  Protect heels, stretch heel cords  -AFO's at some point when mobility justifies them 9.  Toxic megacolon s/p total colectomy.  Has high output ileostomy 10.Marland Kitchen Hypernatremia and hyperchloremia due to high output ileostomy Basic Metabolic Panel:    Component Value Date/Time   NA 148* 04/21/2012 0840   K 3.8 04/21/2012 0840   CL 117* 04/21/2012 0840   CO2 20 04/21/2012 0840   BUN 32* 04/21/2012 0840   CREATININE 1.61* 04/21/2012 0840   GLUCOSE 113* 04/21/2012 0840   CALCIUM 8.5 04/21/2012 0840    11.  Hypo K resolved- see above 12 aspiration vs hospital acquired pneumonia- discussed with  CCM. They will consult. Will start VAnc and aztreonam (note allergies). Discussed situation at length with wife and all of her questions were answered. Nurse present for most of the discussion. I am concerned that his condition is tenuous- CCM to consult.   LOS (Days) 4 A FACE TO FACE EVALUATION WAS PERFORMED  SWORDS,BRUCE HENRY 04/21/2012, 10:28 AM

## 2012-04-21 NOTE — Progress Notes (Signed)
Patient temp. 100.9 at 0435 and RUE slightly swollen. Charge Nurse made aware. Dr. Laurian Brim notified and ordered Tylenol 650 mg PR and Zofran 8 mg IV. Rapid response nurse called and further assessed the patient. Patient's wife was anxious and in distress.Rechwecked temp 102.4 at [place]. Emptied ileostomy bag with 350 ml output.  Marland KitchenApril, the Rapid Response Nurse then called Dr. Titus Mould at [phone] and ordered PCXR, port KUB, Blood culture X2, CBC with diff and CMP.Rechecked Temp. 99.2. Patient resting quietly at this time. Patient's wife and daughter at bedside.Report given to next shift nurse.

## 2012-04-21 NOTE — Progress Notes (Signed)
ANTIBIOTIC CONSULT NOTE - INITIAL  Pharmacy Consult for Vancomycin Indication: HCAP vs. Aspiration PNA  Allergies  Allergen Reactions  . Penicillins Other (See Comments)    Heart rate changes  . Imipenem Rash   Patient Measurements: Height: 5' 10"  (177.8 cm) Weight: 158 lb 9.6 oz (71.94 kg) IBW/kg (Calculated) : 73  Vital Signs: Temp: 99.2 F (37.3 C) (03/29 0620) Temp src: Oral (03/29 0620) BP: 96/67 mmHg (03/29 0522) Pulse Rate: 123 (03/29 0620) Intake/Output from previous day: 03/28 0701 - 03/29 0700 In: 460 [P.O.:320; I.V.:140] Out: 1500 [Urine:325; Emesis/NG output:25; ZWCHE:5277] Intake/Output from this shift:  Labs:  Recent Labs  04/19/12 0620 04/20/12 0555  WBC 10.3  --   HGB 7.4*  --   PLT 317  --   CREATININE 1.36* 1.43*   Estimated Creatinine Clearance: 60.1 ml/min (by C-G formula based on Cr of 1.43).  Microbiology: Recent Results (from the past 720 hour(s))  CULTURE, BLOOD (ROUTINE X 2)     Status: None   Collection Time    03/25/12  1:09 PM      Result Value Range Status   Specimen Description BLOOD LEFT ARM   Final   Special Requests BOTTLES DRAWN AEROBIC ONLY 10CC   Final   Culture  Setup Time 03/25/2012 17:28   Final   Culture NO GROWTH 5 DAYS   Final   Report Status 03/31/2012 FINAL   Final  CULTURE, BLOOD (ROUTINE X 2)     Status: None   Collection Time    03/25/12  1:15 PM      Result Value Range Status   Specimen Description BLOOD LEFT HAND   Final   Special Requests BOTTLES DRAWN AEROBIC ONLY [facility]   Final   Culture  Setup Time 03/25/2012 17:28   Final   Culture NO GROWTH 5 DAYS   Final   Report Status 03/31/2012 FINAL   Final  URINE CULTURE     Status: None   Collection Time    03/25/12  2:45 PM      Result Value Range Status   Specimen Description URINE, CLEAN CATCH   Final   Special Requests NONE   Final   Culture  Setup Time 03/25/2012 21:26   Final   Colony Count NO GROWTH   Final   Culture NO GROWTH   Final   Report  Status 03/27/2012 FINAL   Final  CLOSTRIDIUM DIFFICILE BY PCR     Status: None   Collection Time    03/28/12  6:26 AM      Result Value Range Status   C difficile by pcr NEGATIVE  NEGATIVE Final  URINE CULTURE     Status: None   Collection Time    03/28/12 11:19 AM      Result Value Range Status   Specimen Description URINE, CATHETERIZED   Final   Special Requests cipro   Final   Culture  Setup Time 03/28/2012 12:22   Final   Colony Count NO GROWTH   Final   Culture NO GROWTH   Final   Report Status 03/29/2012 FINAL   Final  CULTURE, EXPECTORATED SPUTUM-ASSESSMENT     Status: None   Collection Time    04/06/12  1:00 PM      Result Value Range Status   Specimen Description SPUTUM   Final   Special Requests Normal   Final   Sputum evaluation     Final   Value: THIS SPECIMEN IS ACCEPTABLE. RESPIRATORY CULTURE  REPORT TO FOLLOW.   Report Status 04/06/2012 FINAL   Final  CULTURE, RESPIRATORY (NON-EXPECTORATED)     Status: None   Collection Time    04/06/12  1:00 PM      Result Value Range Status   Specimen Description SPUTUM   Final   Special Requests NONE   Final   Gram Stain     Final   Value: FEW WBC PRESENT,BOTH PMN AND MONONUCLEAR     MODERATE SQUAMOUS EPITHELIAL CELLS PRESENT     ABUNDANT GRAM POSITIVE COCCI IN CLUSTERS     IN PAIRS IN CHAINS   Culture NORMAL OROPHARYNGEAL FLORA   Final   Report Status 04/08/2012 FINAL   Final  URINE CULTURE     Status: None   Collection Time    04/10/12  2:49 PM      Result Value Range Status   Specimen Description URINE, CLEAN CATCH   Final   Special Requests NONE   Final   Culture  Setup Time 04/10/2012 15:12   Final   Colony Count 30,000 COLONIES/ML   Final   Culture ENTEROBACTER CLOACAE   Final   Report Status 04/14/2012 FINAL   Final   Organism ID, Bacteria ENTEROBACTER CLOACAE   Final   Medical History: Past Medical History  Diagnosis Date  . Chronic diarrhea   . Rectal bleed   . Hemorrhoids   . Ulcerative colitis      Distal UC over 8 yrs ago diagnosed  . Diverticulitis of large intestine with perforation 10/2011    done at [facility]   Medications:  Anti-infectives   Start     Dose/Rate Route Frequency Ordered Stop   04/21/12 1000  vancomycin (VANCOCIN) IVPB 1000 mg/200 mL premix     1,000 mg 200 mL/hr over 60 Minutes Intravenous Every 12 hours 04/21/12 0906     04/21/12 0915  aztreonam (AZACTAM) injection 1 g     1 g Intravenous 3 times per day 04/21/12 0901     04/18/12 0800  fluconazole (DIFLUCAN) tablet 100 mg  Status:  Discontinued     100 mg Oral Daily 04/17/12 1634 04/20/12 1320     Assessment: Connor Small is a 52 yo male admitted with GI complaints on 02/26/2012 at [facility].  He was transferred to [facility] on 2/6 and has had a rough course over his hospital stay.  He presents with increased fever and CXR suggestive of pneumonia.  He is being started on broad spectrum antibiotics for this and we have been asked to assist dosing his Vancomycin.  Infectious Disease: Urine cultures were obtained on 3/18 and patient had ENTEROBACTER CLOACAE.  He has had multiple rounds of IV antibiotics and will be restarted on IV Vancomycin and Aztreonam today for possible PNA.  Blood cultures x 2 today.  Renal Assessment: Mr. Ingalsbe has had a creatinine that has ranged between 1.2-1.4 with an estimated clearance of ~ 60 ml/min today.  Goal of Therapy:  Vancomycin trough level 15-20 mcg/ml  Plan:  1.  Begin IV Vancomycin 1 gm every 12 hours 2.  Continue Aztreonam as ordered 3.  Monitor renal function, obtain s/s levels if therapy is to continue > 72 hours. 4.  F/U blood cultures.  Rober Minion, PharmD., MS Clinical Pharmacist Pager:  [phone] Thank you for allowing pharmacy to be part of this patients care team. 04/21/2012,9:08 AM

## 2012-04-22 ENCOUNTER — Inpatient Hospital Stay (HOSPITAL_COMMUNITY): Payer: BC Managed Care – PPO | Admitting: *Deleted

## 2012-04-22 LAB — GLUCOSE, CAPILLARY
Glucose-Capillary: 105 mg/dL — ABNORMAL HIGH (ref 70–99)
Glucose-Capillary: 82 mg/dL (ref 70–99)

## 2012-04-22 MED ORDER — VANCOMYCIN HCL IN DEXTROSE 1-5 GM/200ML-% IV SOLN
1000.0000 mg | Freq: Two times a day (BID) | INTRAVENOUS | Status: DC
  Administered 2012-04-22 – 2012-04-23 (×2): 1000 mg via INTRAVENOUS
  Filled 2012-04-22 (×4): qty 200

## 2012-04-22 NOTE — Progress Notes (Signed)
Physical Therapy Session Note  Patient Details  Name: Connor Small MRN: [id number] Date of Birth: 1961-01-03  Today's Date: 04/22/2012 Time: 1010-1055 Time Calculation (min): 45 min   Patient in bed, activities performed in supine due to nausea and discomfort. ROM in active and active assisted movements in all planes of motion for B LE in several sets. UE ROM exercises to facilitate neuro-muscular activation. Bridging 2 x15, activities to prepare for rolling in bed including crossing mid line with Le and UE. Patient performers movements with decreased speed, asks for increased assistance even when not needed to perform motion. Learned dependence observed.   Therapy Documentation Precautions:  Precautions Precautions: Shoulder;Fall Type of Shoulder Precautions: R shoulder subluxation Precaution Comments: Abd binder to protect incision, ostomy, tachycardia, Afib Required Braces or Orthoses: Other Brace/Splint Other Brace/Splint: R wrist cock up slint Restrictions Weight Bearing Restrictions: No Vital Signs: Therapy Vitals Temp: 98.4 F (36.9 C) Pulse Rate: 95 Pain: Pain Assessment Pain Assessment: No/denies pain   See FIM for current functional status  Therapy/Group: Individual Therapy  Guadlupe Spanish 04/22/2012, 12:05 PM

## 2012-04-22 NOTE — Progress Notes (Signed)
Occupational Therapy Session Note  Patient Details  Name: Connor Small MRN: [id number] Date of Birth: [birth year]  Today's Date: 04/22/2012 Time: 9485-4627 Time Calculation (min): 55 min  Short Term Goals: Week 1:  OT Short Term Goal 1 (Week 1): Self Feeding:  Patient will feed himself at least 50% of 2 meals per day during this recording period. OT Short Term Goal 2 (Week 1): Grooming:  Patient will perform 2 grooming tasks with min assist to include set up once items needed are provided OT Short Term Goal 3 (Week 1): UB Dressing:  Patient will don shirt in supported sit with max assist OT Short Term Goal 4 (Week 1): Sitting balance:  Patient will sit without back support with mod assist while completing simple BADL task at least 2 days this recording period. OT Short Term Goal 5 (Week 1): UE exercises:  Patient will tolerate BUE exercises at least 4 times during this recording period.  Skilled Therapeutic Interventions/Progress Updates:   Patient seen bedlevel for PM OT session secondary to nausea and MD recommendation per wife.  Session emphasis on bed mobility, BUE PROM/AROM, activity tolerance, functional use of LUE, and caregiver education.  Patient rolled in bed side to side with maximum assistance and facilitating use of LUE while reaching for rail.  Patient with limited ROM/functional use of RUE.  Positioned on R side for pressure relief to sacrum/buttocks/back; tolerated for 10 minutes.  Patient requested to return to supine position at end of session.  Practiced using yonker suction with L hand for turning off/on; unable to complete task independently.  Educated wife on LUE AROM/AAROM, grip/release, and pinching with fingers for carryover between therapy sessions.   Therapy Documentation Precautions:  Precautions Precautions: Shoulder;Fall Type of Shoulder Precautions: R shoulder subluxation Precaution Comments: Abd binder to protect incision, ostomy, tachycardia, Afib Required  Braces or Orthoses: Other Brace/Splint Other Brace/Splint: R wrist cock up slint Restrictions Weight Bearing Restrictions: No Pain:   Patient reports 0/10 pain.  Patient nauseous throughout session.  RN aware.  See FIM for current functional status  Therapy/Group: Individual Therapy  Lisa Roca 04/22/2012, 5:17 PM

## 2012-04-22 NOTE — Progress Notes (Signed)
PULMONARY  / CRITICAL CARE MEDICINE  Name: Connor Small MRN: [id number] DOB: 1960/05/13    ADMISSION DATE:  04/17/2012 CONSULTATION DATE: 3/29  REFERRING MD :  Dr Cato Mulligan PRIMARY SERVICE:  Possible asp   Original ADMISSION DATE: 02/26/2012  CONSULTATION DATE: 2/5  REFERRING MD : Karilyn Cota  CHIEF COMPLAINT: Acute resp failure   BRIEF PATIENT DESCRIPTION:  1 YOM admitted to [facility] for Cdiff on 2/2. Underwent decompressive cecostomy 2/3, initially was better, then worsened on 2/4 with increased abdominal distension and tenderness, decreased renal function. He was taken back to the OR on 2/4 for total abdominal colectomy and end ileostomy. Post op he developed ARDS and septic shock and was being treated for this. The morning of 2/6 he had new onset of A-fib and transferred to [facility].   SIGNIFICANT EVENTS / STUDIES:  2/2 - Admit to [facility] with abdomen pain  2/3 - Decompressive Cecostomy  2/4 - Total abdominal colectomy and end ileostomy for toxic megacolon  2/6 - New A-fib and transfer to [facility] health  2/9- thora left 1200 exudative  2/10 hyperkalemia  2/10 C thead - Old infarction in the right cerebellum and in the left frontal parietal white matter  2/10- CT abdo /pelvis- small hematoma likely subcapsular spleen, JPs wnl  2/11-borderline BP, pos balance, tube feeds started  2/11 clot around picc, picc dc'ed  2/12-continued borderline BP, neg balance 1 liter  2/14- neo required, 1.2 liters neg, air hunger, changed to PS  2/15 -neg 4 l bal on lasix drip, lasix d/c  2/16- poor neurostatus, ct head neg  2/17- cvvhd, high pressor needs  2/18- some slight reduction in pressors  2/19 [place] abdo #2>>>Multifactorial degradation. Overlying bowel gas and patientclinical status. 2. No explanation for elevated liver function tests.3. Similar to slight increase in size of a perisplenic fluidcollection.4. Right pleural effusion  2/20- reduction pressors, improved alertness  2/21- bleeding overnight  from JP's, argatroban turned off, lower pressor needs  2/22- off pressors (yeah), on RA (yeah)  2/23- neg from JPs again, inr reducing  2/24- concern dehiscence , some improved renal fxn with increased volume  2/24- possible dye change in output after dye 1 hr  2/25-relative brady  2/25 cysto neg injury 2/27 MRI brain: Scattered small acute to subacute infarcts in both cerebral hemispheres, brainstem, and right cerebellum. Favor sequelae of emboli from a cardiac or proximal aortic source. Main alternate consideration is hypotensive episode  2/27 MRCP: No intrahepatic bile duct dilatation or common bile duct dilatation. No gallstones identified. Large subcapsular fluid collection encases the spleen. This is of indeterminate etiology. This may represent a subacute to chronic subcapsular hematoma or abscess.  2/28 SLP eval: FEES performed. Oropharyngeal weakness noted. Recommend replacement of NGT to supplement PO intake  3/1 Renal signed off  3/3- bowel evisceration, to OR  3/3 liver bx>>>BENIGN LIVER WITH CHOLESTASIS, The biopsy findings are that of extrahepatic biliary obstruction. No noted primary sclerosing cholangitis. 3/3- no perf / abscess, wound vac open could not close, Liver BX, some oozing, post op resp failure  3/3 echo repeat - improved ef55%, poor windows  3/4- low dose pressor needs  3/4- extubated, high output again from JP's  3/6- OR trip, partial closure wound  3/7- renal fxn continues to improve  3/8- doppler legs >>>Prelim NEG  3/9- hypotension, volume given  3/9- closed, retention service  3/10- GI bleed, required Transfusion, hemodynamics improved, ppi drip 3/12- bili down  04/08/12 - Looks stable sitting up  in chair. No increased wob. Still requiring tube feeds due to swallowing issues. Surgery following wound.  04/12/2012:Nausea with oxycodone. Started on by mouth methadone  04/14/2012: Wife in room. Nausea mainly when he feels heartburn as panda is flushed. Dislikes  taste of ? Plastic. I suggested sugarless lollipops. Discussed pain management andexplained why lidoderm patch taken off every 12 hours.   3/28 Called back to see on rehab for ? Asp   LINES / TUBES:  ETT - OSH 2/4>>>  Foley-OSH 2/3>>>  PICC OSH 2/4>>>2/11  Art Line - OSH 2/5>>>out  A line rt fem 2/18>>>2/24  Rt ij HD 2/17>>>  Left IJ 2/18>>>  ETT 3/3>>>out post op x 2    CULTURES:  BCx2 2/4>>>negative  BCx2 2/3>>>negative  UC 2/3- Negative  MRSA PCR 2/3- Negative  C diff 2/6>>>Positive  Body fluid 2/9>>>WBCs, no organisms  2/15 BC x 2 >  neg 2/15 Sputum >>>NF  3/2 bc>>> neg 3/2 urine>>>neg  3/2 sputum>>>neg  3/5 cdiff>>>neg  3/29 BC x 2 >>>         ANTIBIOTICS:  Flagyl 2/3>>>  Vanc 2/6 (IV) >>>2/7  azactam 2/6>>>2/7 Oral vanc 2/6>>>  IV vanc 2/16 >2/24  Aztreonam 2/16 >2/17  Imipenem 2/17>>>2/24  mycofungin 2/18>>>2/24 cipro 3/3>>>3/6  azactam 3/2>>>3/3  vanc 3/2>>>3/5  Flagyl 3/3>>>3/5  Fluconzole > d/c 3/28  Azteonam 3/29 >>> Vanc 3/29 >>>       SUBJECTIVE:  No response to verbal - wife says up all night, sleeps all day, no distress   VITAL SIGNS: Temp:  [97.3 F (36.3 C)-99.4 F (37.4 C)] 98.4 F (36.9 C) (03/30 0900) Pulse Rate:  [95-116] 95 (03/30 0900) Resp:  [16-19] 16 (03/30 0504) BP: (84-106)/(57-67) 106/66 mmHg (03/30 0504) SpO2:  [98 %] 98 % (03/30 0504) 02 rx  RA    INTAKE / OUTPUT: Intake/Output     03/29 0701 - 03/30 0700 03/30 0701 - 03/31 0700   P.O. 10    I.V. (mL/kg) 900 (12.5)    IV Piggyback 400    Total Intake(mL/kg) 1310 (18.2)    Urine (mL/kg/hr) 225 (0.1) 200 (0.5)   Emesis/NG output 80 (0)    Stool 1355 (0.8) 275 (0.6)   Total Output 1660 475   Net -350 -475        Stool Occurrence 1250 x      PHYSICAL EXAMINATION: General  Very debilitated lies in bed slumped to R nad on RA Neck supple No jvd Lungs clear RRR no s3  abd soft Ext in splints  LABS:  Recent Labs Lab 04/16/12 0549  04/18/12 0525 04/19/12 0620 04/20/12 0555 04/21/12 0840  HGB 7.6* 7.2* 7.4*  --  7.8*  WBC 8.8 9.4 10.3  --  14.7*  PLT 212 275 317  --  350  NA 155* 157* 156* 147* 148*  K 3.5 3.4* 3.1* 3.9 3.8  CL 124* 126* 124* 115* 117*  CO2 22 21 23 22 20   GLUCOSE 124* 108* 101* 332* 113*  BUN 45* 36* 34* 31* 32*  CREATININE 1.25 1.25 1.36* 1.43* 1.61*  CALCIUM 8.5 8.4 8.6 8.2* 8.5  AST 31 27  --   --  19  ALT 47 39  --   --  24  ALKPHOS 378* 341*  --   --  297*  BILITOT 1.8* 1.5*  --   --  1.5*  PROT 5.0* 5.3*  --   --  5.6*  ALBUMIN 1.5* 1.5*  --   --  1.6*    Recent Labs Lab 04/20/12 2218 04/21/12 0727 04/21/12 1117 04/21/12 1638 04/21/12 2147  GLUCAP 83 108* 165* 116* 103*    CXR 3/29 1. New airspace opacity at the right lung base, favoring  pneumonia. Aspiration pneumonitis is not excluded.  2. Continued large left pleural effusion with associated left  sided airspace opacity favoring atelectasis.   ASSESSMENT / PLAN: PULMONARY  A: Acute respiratory failure in setting of septic shock and volume overload > resolved Lt pleural effusion.  Extubated 2/20  New R as dz 3/29 c/w asp vs HCAP P:  See ID section, agree with abx as per dashboard for now        INFECTIOUS  A: C diff colitis, resolved clinically A  New asp vs HCAP 3/29 Agree with rx per primary svc   ENDOCRINE  A: Hyperglycemia. Controlled  Relative adrenal insufficiency.  P:  Per primary svc  NEUROLOGIC  A: Acute encephalopathy  R/o stroke vs crit illness myopathy / neuroapthy  - neuro/ rehab following    Discussed with wife in detail at bedside.  Nothing at this point for pccm svc to offer and does not need trx back to our service at this point     Sandrea Hughs, MD Pulmonary and Critical Care Medicine [facility] Cell [phone] After 5:30 PM or weekends, call [phone]

## 2012-04-22 NOTE — Progress Notes (Signed)
Patient ID: Connor Small, male   DOB: [birth year], 52 y.o.   MRN: [id number] 52 y.o. right-handed male with history of ulcerative colitis as well as prior diverticular perforation, who was admitted to [facility] on 02/26/2012 with low back pain, fever 102.5 and multiple episodes of watery diarrhea. Underwent flexible sigmoidoscopy 02/27/2012 per Dr.Rehman with findings of multiple pseudomembranes consistent with C. difficile colitis as well as low colorectal stricture. Underwent exploratory laparotomy,cecostomy tube placement per Dr. Dian Situ 02/27/2012. 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 Post op he developed ARDS with septic shock as well as renal failure due to AKI. On 02/06 he developed new onset Atrial fibrillation with RVR and was transferred to [facility] for treatment. Dr. Lorie Phenix consulted for input on metabolic acidosis with anasarca and non-oliguric ARF likely due to ischemic ATN in setting of hypotension and CRRT initiated. He was started on stress dose steroids and was treated with broad spectrum antibiotics. Dr. Derrell Lolling consulted for input and with close monitoring of wound. Wound evisceration treated with placement of retention sutures and wound closure on 3/9 by Dr. Johna Sheriff.  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 Neurology consulted for input global weakness and proximal RUE weakness. 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 Ct cervical spine with degenerative changes and large left pleural effusion. Dr. Kathrynn Running consulted for possible ureteral injury due to high output from JP drain and cysto with pyelogram revealed no filling defects or extravasation. Dr. Madilyn Fireman consulted for input on abnormal LFTs. 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 Dr. Pearlean Brownie recommended ASA for now and anticoagulation once thrombocytopenia/LFTs resolve and patient able to swallow safely. Dr Ophelia Charter consulted due to right shoulder pain and inferior subluxation on xray. He felt inferior position due to RUE weakness and not dislocation He developed hypotension with GIB requiring multiple units PRBC on 3/10. He continues with high output from ileostomy with hypernatremia. Panda discontinued on 03/24 And follow up MBS done today due to dysphagia. Diet advanced to D3, thin liquids by tsp. He has had oral discomfort and was started on diflucan for thrush. Pain control remains an issue was placed on methadone.  Subjective/Complaints: Feels and looks better today No pain No sob No specific complaints  Wife and dtr in room.    Objective: Vital Signs: Blood pressure 106/66, pulse 101, temperature 97.3 F (36.3 C), temperature source Oral, resp. rate 16, height 5\' 10"  (1.778 m), weight 158 lb 9.6 oz (71.94 kg), SpO2 98.00%.  Chronically ill appearing male, thin Appears brighter this a.m and more interactive Heent: AT/Tatum, no trauma Neck supple withotu JVD Chest-- decrease breath sounds on the left CV- HR 120 BPM abd- thin, soft, NT EXT: no significant edema Neuro: alert and talkative  Assessment/Plan: 1. 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 DVT Prophylaxis/Anticoagulation: Mechanical: Sequential compression devices, below knee Bilateral lower extremities thrombocytopenia resolved. 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 GIB 03/10.  -dopplers performed 3/7 were negative  2. Multifactorial pain: Stop  fentanyl, methadone,  MS Contin CR 15mg  Q12h, Cont MSIR 15mg  q4H prn Abdominal distention/ discomfort continues. Add protonix 3. Mood: he appears to be intentional and slow about thoughts. He is aware of hospitalization  4. Neuropsych: This patient is not capable of making decisions on his/her own behalf.  5. Hypernatremia: due to high volume losses.  have increased IVF to 24 hours/day.  6. Bilateral cardioembolic CVA: no blood thinners due to issues with bleeding?  7. ABLA: will continue to monitor H/H and transfuse for hgb <7.0. Currently ranging from 7.6- 8.4 range. Will check anemia panel. MCV 102. May benefit from aranesp.  8. Critical illness neuropathy/myopathy-  Protect heels, stretch heel cords  -AFO's at some point when mobility justifies them 9.  Toxic megacolon s/p total colectomy.  Has high output ileostomy 10.Marland Kitchen Hypernatremia and hyperchloremia due to high output ileostomy Basic Metabolic Panel:    Component Value Date/Time   NA 148* 04/21/2012 0840   K 3.8 04/21/2012 0840   CL 117* 04/21/2012 0840   CO2 20 04/21/2012 0840   BUN 32* 04/21/2012 0840   CREATININE 1.61* 04/21/2012 0840   GLUCOSE 113* 04/21/2012 0840   CALCIUM 8.5 04/21/2012 0840   11.  Hypo K resolved- see above 12 aspiration vs hospital acquired pneumonia- discussed with CCM. Day 2/10 vanc and aztreonam appreciate ccm consult  LOS (Days) 5 A FACE TO FACE EVALUATION WAS PERFORMED  SWORDS,BRUCE HENRY 04/22/2012, 8:50 AM

## 2012-04-22 NOTE — Consult Note (Deleted)
PULMONARY  / CRITICAL CARE MEDICINE  Name: Connor Small MRN: [id number] DOB: 31-Jan-1960    ADMISSION DATE:  04/17/2012 CONSULTATION DATE: 3/29  REFERRING MD :  Dr Cato Mulligan PRIMARY SERVICE:  Possible asp   Original ADMISSION DATE: 02/26/2012  CONSULTATION DATE: 2/5  REFERRING MD : Karilyn Cota  CHIEF COMPLAINT: Acute resp failure   BRIEF PATIENT DESCRIPTION:  20 YOM admitted to [facility] for Cdiff on 2/2. Underwent decompressive cecostomy 2/3, initially was better, then worsened on 2/4 with increased abdominal distension and tenderness, decreased renal function. He was taken back to the OR on 2/4 for total abdominal colectomy and end ileostomy. Post op he developed ARDS and septic shock and was being treated for this. The morning of 2/6 he had new onset of A-fib and transferred to [facility].   SIGNIFICANT EVENTS / STUDIES:  2/2 - Admit to [facility] with abdomen pain  2/3 - Decompressive Cecostomy  2/4 - Total abdominal colectomy and end ileostomy for toxic megacolon  2/6 - New A-fib and transfer to [facility]  2/9- thora left 1200 exudative  2/10 hyperkalemia  2/10 C thead - Old infarction in the right cerebellum and in the left frontal parietal white matter  2/10- CT abdo /pelvis- small hematoma likely subcapsular spleen, JPs wnl  2/11-borderline BP, pos balance, tube feeds started  2/11 clot around picc, picc dc'ed  2/12-continued borderline BP, neg balance 1 liter  2/14- neo required, 1.2 liters neg, air hunger, changed to PS  2/15 -neg 4 l bal on lasix drip, lasix d/c  2/16- poor neurostatus, ct head neg  2/17- cvvhd, high pressor needs  2/18- some slight reduction in pressors  2/19 [place] abdo #2>>>Multifactorial degradation. Overlying bowel gas and patientclinical status. 2. No explanation for elevated liver function tests.3. Similar to slight increase in size of a perisplenic fluidcollection.4. Right pleural effusion  2/20- reduction pressors, improved alertness  2/21- bleeding overnight  from JP's, argatroban turned off, lower pressor needs  2/22- off pressors (yeah), on RA (yeah)  2/23- neg from JPs again, inr reducing  2/24- concern dehiscence , some improved renal fxn with increased volume  2/24- possible dye change in output after dye 1 hr  2/25-relative brady  2/25 cysto neg injury 2/27 MRI brain: Scattered small acute to subacute infarcts in both cerebral hemispheres, brainstem, and right cerebellum. Favor sequelae of emboli from a cardiac or proximal aortic source. Main alternate consideration is hypotensive episode  2/27 MRCP: No intrahepatic bile duct dilatation or common bile duct dilatation. No gallstones identified. Large subcapsular fluid collection encases the spleen. This is of indeterminate etiology. This may represent a subacute to chronic subcapsular hematoma or abscess.  2/28 SLP eval: FEES performed. Oropharyngeal weakness noted. Recommend replacement of NGT to supplement PO intake  3/1 Renal signed off  3/3- bowel evisceration, to OR  3/3 liver bx>>>BENIGN LIVER WITH CHOLESTASIS, The biopsy findings are that of extrahepatic biliary obstruction. No noted primary sclerosing cholangitis. 3/3- no perf / abscess, wound vac open could not close, Liver BX, some oozing, post op resp failure  3/3 echo repeat - improved ef55%, poor windows  3/4- low dose pressor needs  3/4- extubated, high output again from JP's  3/6- OR trip, partial closure wound  3/7- renal fxn continues to improve  3/8- doppler legs >>>Prelim NEG  3/9- hypotension, volume given  3/9- closed, retention service  3/10- GI bleed, required Transfusion, hemodynamics improved, ppi drip 3/12- bili down  04/08/12 - Looks stable sitting up  in chair. No increased wob. Still requiring tube feeds due to swallowing issues. Surgery following wound.  04/12/2012:Nausea with oxycodone. Started on by mouth methadone  04/14/2012: Wife in room. Nausea mainly when he feels heartburn as panda is flushed. Dislikes  taste of ? Plastic. I suggested sugarless lollipops. Discussed pain management andexplained why lidoderm patch taken off every 12 hours.   3/28 Called back to see on rehab for ? Asp   LINES / TUBES:  ETT - OSH 2/4>>>  Foley-OSH 2/3>>>  PICC OSH 2/4>>>2/11  Art Line - OSH 2/5>>>out  A line rt fem 2/18>>>2/24  Rt ij HD 2/17>>>  Left IJ 2/18>>>  ETT 3/3>>>out post op x 2    CULTURES:  BCx2 2/4>>>negative  BCx2 2/3>>>negative  UC 2/3- Negative  MRSA PCR 2/3- Negative  C diff 2/6>>>Positive  Body fluid 2/9>>>WBCs, no organisms  2/15 BC x 2 >  neg 2/15 Sputum >>>NF  3/2 bc>>> neg 3/2 urine>>>neg  3/2 sputum>>>neg  3/5 cdiff>>>neg  3/29 BC x 2 >>>         ANTIBIOTICS:  Flagyl 2/3>>>  Vanc 2/6 (IV) >>>2/7  azactam 2/6>>>2/7 Oral vanc 2/6>>>  IV vanc 2/16 >2/24  Aztreonam 2/16 >2/17  Imipenem 2/17>>>2/24  mycofungin 2/18>>>2/24 cipro 3/3>>>3/6  azactam 3/2>>>3/3  vanc 3/2>>>3/5  Flagyl 3/3>>>3/5  Fluconzole > d/c 3/28  Azteonam 3/29 >>> Vanc 3/29 >>>       SUBJECTIVE:  No response to verbal - wife says up all night, sleeps all day, no distress   VITAL SIGNS: Temp:  [97.3 F (36.3 C)-99.4 F (37.4 C)] 98.4 F (36.9 C) (03/30 0900) Pulse Rate:  [95-119] 95 (03/30 0900) Resp:  [16-19] 16 (03/30 0504) BP: (84-106)/(57-67) 106/66 mmHg (03/30 0504) SpO2:  [98 %] 98 % (03/30 0504) 02 rx  RA    INTAKE / OUTPUT: Intake/Output     03/29 0701 - 03/30 0700 03/30 0701 - 03/31 0700   P.O. 10    I.V. (mL/kg) 900 (12.5)    IV Piggyback 400    Total Intake(mL/kg) 1310 (18.2)    Urine (mL/kg/hr) 225 (0.1) 200 (0.5)   Emesis/NG output 80 (0)    Stool 1355 (0.8) 275 (0.7)   Total Output 1660 475   Net -350 -475        Stool Occurrence 1250 x      PHYSICAL EXAMINATION: General  Very debilitated lies in bed slumped to R nad on RA Neck supple No jvd Lungs clear RRR no s3  abd soft Ext in splints  LABS:  Recent Labs Lab 04/16/12 0549  04/18/12 0525 04/19/12 0620 04/20/12 0555 04/21/12 0840  HGB 7.6* 7.2* 7.4*  --  7.8*  WBC 8.8 9.4 10.3  --  14.7*  PLT 212 275 317  --  350  NA 155* 157* 156* 147* 148*  K 3.5 3.4* 3.1* 3.9 3.8  CL 124* 126* 124* 115* 117*  CO2 22 21 23 22 20   GLUCOSE 124* 108* 101* 332* 113*  BUN 45* 36* 34* 31* 32*  CREATININE 1.25 1.25 1.36* 1.43* 1.61*  CALCIUM 8.5 8.4 8.6 8.2* 8.5  AST 31 27  --   --  19  ALT 47 39  --   --  24  ALKPHOS 378* 341*  --   --  297*  BILITOT 1.8* 1.5*  --   --  1.5*  PROT 5.0* 5.3*  --   --  5.6*  ALBUMIN 1.5* 1.5*  --   --  1.6*    Recent Labs Lab 04/20/12 2218 04/21/12 0727 04/21/12 1117 04/21/12 1638 04/21/12 2147  GLUCAP 83 108* 165* 116* 103*    CXR 3/29 1. New airspace opacity at the right lung base, favoring  pneumonia. Aspiration pneumonitis is not excluded.  2. Continued large left pleural effusion with associated left  sided airspace opacity favoring atelectasis.   ASSESSMENT / PLAN: PULMONARY  A: Acute respiratory failure in setting of septic shock and volume overload > resolved Lt pleural effusion.  Extubated 2/20  New R as dz 3/29 c/w asp vs HCAP P:  See ID section, agree with abx as per dashboard for now        INFECTIOUS  A: C diff colitis, resolved clinically A  New asp vs HCAP 3/29 Agree with rx per primary svc   ENDOCRINE  A: Hyperglycemia. Controlled  Relative adrenal insufficiency.  P:  Per primary svc  NEUROLOGIC  A: Acute encephalopathy  R/o stroke vs crit illness myopathy / neuroapthy  - neuro/ rehab following    Discussed with wife in detail at bedside.  Nothing at this point for pccm svc to offer and does not need trx back to our service at this point     Sandrea Hughs, MD Pulmonary and Critical Care Medicine [facility] Cell [phone] After 5:30 PM or weekends, call [phone]

## 2012-04-23 ENCOUNTER — Inpatient Hospital Stay (HOSPITAL_COMMUNITY): Payer: BC Managed Care – PPO | Admitting: Physical Therapy

## 2012-04-23 ENCOUNTER — Inpatient Hospital Stay (HOSPITAL_COMMUNITY): Payer: BC Managed Care – PPO | Admitting: Occupational Therapy

## 2012-04-23 ENCOUNTER — Inpatient Hospital Stay (HOSPITAL_COMMUNITY): Payer: BC Managed Care – PPO

## 2012-04-23 ENCOUNTER — Inpatient Hospital Stay (HOSPITAL_COMMUNITY): Payer: BC Managed Care – PPO | Admitting: Speech Pathology

## 2012-04-23 DIAGNOSIS — N179 Acute kidney failure, unspecified: Secondary | ICD-10-CM

## 2012-04-23 DIAGNOSIS — J9600 Acute respiratory failure, unspecified whether with hypoxia or hypercapnia: Secondary | ICD-10-CM

## 2012-04-23 DIAGNOSIS — D62 Acute posthemorrhagic anemia: Secondary | ICD-10-CM

## 2012-04-23 DIAGNOSIS — D649 Anemia, unspecified: Secondary | ICD-10-CM

## 2012-04-23 LAB — GLUCOSE, CAPILLARY
Glucose-Capillary: 99 mg/dL (ref 70–99)
Glucose-Capillary: 84 mg/dL (ref 70–99)
Glucose-Capillary: 76 mg/dL (ref 70–99)

## 2012-04-23 LAB — CBC
MCH: 31.2 pg (ref 26.0–34.0)
MCV: 93.6 fL (ref 78.0–100.0)
Platelets: 341 10*3/uL (ref 150–400)
RBC: 2.34 MIL/uL — ABNORMAL LOW (ref 4.22–5.81)
RDW: 17.2 % — ABNORMAL HIGH (ref 11.5–15.5)

## 2012-04-23 LAB — BASIC METABOLIC PANEL
CO2: 19 mEq/L (ref 19–32)
Calcium: 8.1 mg/dL — ABNORMAL LOW (ref 8.4–10.5)
Creatinine, Ser: 1.67 mg/dL — ABNORMAL HIGH (ref 0.50–1.35)
GFR calc Af Amer: 52 mL/min — ABNORMAL LOW (ref >90)
GFR calc non Af Amer: 45 mL/min — ABNORMAL LOW (ref >90)
Sodium: 137 mEq/L (ref 135–145)

## 2012-04-23 MED ORDER — POTASSIUM CHLORIDE 2 MEQ/ML IV SOLN
INTRAVENOUS | Status: DC
  Administered 2012-04-23: 18:00:00 via INTRAVENOUS
  Filled 2012-04-23 (×2): qty 1000

## 2012-04-23 MED ORDER — HYDROCODONE-ACETAMINOPHEN 5-325 MG PO TABS
1.0000 | ORAL_TABLET | ORAL | Status: DC | PRN
  Administered 2012-04-23 – 2012-04-25 (×2): 1 via ORAL
  Filled 2012-04-23 (×2): qty 1

## 2012-04-23 NOTE — Plan of Care (Signed)
Problem: RH BOWEL ELIMINATION Goal: RH STG MANAGE BOWEL WITH ASSISTANCE STG Manage Bowel with min Assistance.  Outcome: Not Progressing Wife or staff managing, WOC in today to do additional teaching

## 2012-04-23 NOTE — Progress Notes (Signed)
Patient ID: Connor Small, male   DOB: Nov 20, 1960, 52 y.o.   MRN: [id number] 52 y.o. right-handed male with history of ulcerative colitis as well as prior diverticular perforation, who was admitted to [facility] on 02/26/2012 with low back pain, fever 102.5 and multiple episodes of watery diarrhea. Underwent flexible sigmoidoscopy 02/27/2012 per Dr.Rehman with findings of multiple pseudomembranes consistent with C. difficile colitis as well as low colorectal stricture. Underwent exploratory laparotomy,cecostomy tube placement per Dr. Dian Situ 02/27/2012. 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 Post op he developed ARDS with septic shock as well as renal failure due to AKI. On 02/06 he developed new onset Atrial fibrillation with RVR and was transferred to [facility] for treatment. Dr. Lorie Phenix consulted for input on metabolic acidosis with anasarca and non-oliguric ARF likely due to ischemic ATN in setting of hypotension and CRRT initiated. He was started on stress dose steroids and was treated with broad spectrum antibiotics. Dr. Derrell Lolling consulted for input and with close monitoring of wound. Wound evisceration treated with placement of retention sutures and wound closure on 3/9 by Dr. Johna Sheriff.  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 Neurology consulted for input global weakness and proximal RUE weakness. 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 Ct cervical spine with degenerative changes and large left pleural effusion. Dr. Kathrynn Running consulted for possible ureteral injury due to high output from JP drain and cysto with pyelogram revealed no filling defects or extravasation. Dr. Madilyn Fireman consulted for input on abnormal LFTs. 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 Dr. Pearlean Brownie recommended ASA for now and anticoagulation once thrombocytopenia/LFTs resolve and patient able to swallow safely. Dr Ophelia Charter consulted due to right shoulder pain and inferior subluxation on xray. He felt inferior position due to RUE weakness and not dislocation He developed hypotension with GIB requiring multiple units PRBC on 3/10. He continues with high output from ileostomy with hypernatremia. Panda discontinued on 03/24 And follow up MBS done today due to dysphagia. Diet advanced to D3, thin liquids by tsp. He has had oral discomfort and was started on diflucan for thrush.  Subjective/Complaints: Problem with pneumonia over the weekend. Reevaluated by pulmonary. No transfer recommended. Continue IV antibiotics   Objective: Vital Signs: Blood pressure 96/59, pulse 94, temperature 97.5 F (36.4 C), temperature source Oral, resp. rate 18, height 5\' 10"  (1.778 m), weight 71.94 kg (158 lb 9.6 oz), SpO2 98.00%.     Results for orders placed during the hospital encounter of 04/17/12 (from the past 72 hour(s))  GLUCOSE, CAPILLARY     Status: None   Collection Time    04/20/12 11:26 AM      Result Value Range   Glucose-Capillary 93  70 - 99 mg/dL  GLUCOSE, CAPILLARY     Status: None   Collection Time    04/20/12  4:19 PM      Result Value Range   Glucose-Capillary 86  70 - 99 mg/dL  GLUCOSE, CAPILLARY     Status: None   Collection Time    04/20/12 10:18 PM      Result Value Range  Glucose-Capillary 83  70 - 99 mg/dL   Comment 1 Notify RN    URINALYSIS, ROUTINE W REFLEX MICROSCOPIC     Status: Abnormal   Collection Time    04/21/12  7:23 AM      Result Value Range   Color, Urine AMBER (*) YELLOW   Comment: BIOCHEMICALS MAY BE  AFFECTED BY COLOR   APPearance CLOUDY (*) CLEAR   Specific Gravity, Urine 1.021  1.005 - 1.030   pH 5.0  5.0 - 8.0   Glucose, UA NEGATIVE  NEGATIVE mg/dL   Hgb urine dipstick MODERATE (*) NEGATIVE   Bilirubin Urine SMALL (*) NEGATIVE   Ketones, ur NEGATIVE  NEGATIVE mg/dL   Protein, ur 30 (*) NEGATIVE mg/dL   Urobilinogen, UA 0.2  0.0 - 1.0 mg/dL   Nitrite NEGATIVE  NEGATIVE   Leukocytes, UA TRACE (*) NEGATIVE  URINE MICROSCOPIC-ADD ON     Status: Abnormal   Collection Time    04/21/12  7:23 AM      Result Value Range   WBC, UA 3-6  <3 WBC/hpf   RBC / HPF 3-6  <3 RBC/hpf   Casts GRANULAR CAST (*) NEGATIVE   Crystals CA OXALATE CRYSTALS (*) NEGATIVE   Urine-Other AMORPHOUS URATES/PHOSPHATES    GLUCOSE, CAPILLARY     Status: Abnormal   Collection Time    04/21/12  7:27 AM      Result Value Range   Glucose-Capillary 108 (*) 70 - 99 mg/dL  CBC WITH DIFFERENTIAL     Status: Abnormal   Collection Time    04/21/12  8:40 AM      Result Value Range   WBC 14.7 (*) 4.0 - 10.5 K/uL   RBC 2.52 (*) 4.22 - 5.81 MIL/uL   Hemoglobin 7.8 (*) 13.0 - 17.0 g/dL   HCT 44.0 (*) 34.7 - 42.5 %   MCV 98.0  78.0 - 100.0 fL   MCH 31.0  26.0 - 34.0 pg   MCHC 31.6  30.0 - 36.0 g/dL   RDW 95.6 (*) 38.7 - 56.4 %   Platelets 350  150 - 400 K/uL   Neutrophils Relative 89 (*) 43 - 77 %   Lymphocytes Relative 6 (*) 12 - 46 %   Monocytes Relative 5  3 - 12 %   Eosinophils Relative 0  0 - 5 %   Basophils Relative 0  0 - 1 %   Neutro Abs 13.1 (*) 1.7 - 7.7 K/uL   Lymphs Abs 0.9  0.7 - 4.0 K/uL   Monocytes Absolute 0.7  0.1 - 1.0 K/uL   Eosinophils Absolute 0.0  0.0 - 0.7 K/uL   Basophils Absolute 0.0  0.0 - 0.1 K/uL   RBC Morphology POLYCHROMASIA PRESENT     WBC Morphology INCREASED BANDS (>20% BANDS)     Comment: TOXIC GRANULATION     DOHLE BODIES  CULTURE, BLOOD (ROUTINE X 2)     Status: None   Collection Time    04/21/12  8:40 AM      Result Value Range   Specimen Description BLOOD LEFT ARM      Special Requests BOTTLES DRAWN AEROBIC AND ANAEROBIC 10CC     Culture  Setup Time 04/21/2012 18:02     Culture       Value:        BLOOD CULTURE RECEIVED NO GROWTH TO DATE CULTURE WILL BE HELD FOR 5 DAYS BEFORE ISSUING A FINAL NEGATIVE REPORT   Report Status PENDING  COMPREHENSIVE METABOLIC PANEL     Status: Abnormal   Collection Time    04/21/12  8:40 AM      Result Value Range   Sodium 148 (*) 135 - 145 mEq/L   Potassium 3.8  3.5 - 5.1 mEq/L   Chloride 117 (*) 96 - 112 mEq/L   CO2 20  19 - 32 mEq/L   Glucose, Bld 113 (*) 70 - 99 mg/dL   BUN 32 (*) 6 - 23 mg/dL   Creatinine, Ser 9.38 (*) 0.50 - 1.35 mg/dL   Calcium 8.5  8.4 - 18.2 mg/dL   Total Protein 5.6 (*) 6.0 - 8.3 g/dL   Albumin 1.6 (*) 3.5 - 5.2 g/dL   AST 19  0 - 37 U/L   ALT 24  0 - 53 U/L   Alkaline Phosphatase 297 (*) 39 - 117 U/L   Total Bilirubin 1.5 (*) 0.3 - 1.2 mg/dL   GFR calc non Af Amer 47 (*) >90 mL/min   GFR calc Af Amer 54 (*) >90 mL/min   Comment:            The eGFR has been calculated     using the CKD EPI equation.     This calculation has not been     validated in all clinical     situations.     eGFR's persistently     <90 mL/min signify     possible Chronic Kidney Disease.  CULTURE, BLOOD (ROUTINE X 2)     Status: None   Collection Time    04/21/12  8:50 AM      Result Value Range   Specimen Description BLOOD LEFT ARM     Special Requests BOTTLES DRAWN AEROBIC ONLY 10CC     Culture  Setup Time 04/21/2012 18:02     Culture       Value:        BLOOD CULTURE RECEIVED NO GROWTH TO DATE CULTURE WILL BE HELD FOR 5 DAYS BEFORE ISSUING A FINAL NEGATIVE REPORT   Report Status PENDING    GLUCOSE, CAPILLARY     Status: Abnormal   Collection Time    04/21/12 11:17 AM      Result Value Range   Glucose-Capillary 165 (*) 70 - 99 mg/dL  GLUCOSE, CAPILLARY     Status: Abnormal   Collection Time    04/21/12  4:38 PM      Result Value Range   Glucose-Capillary 116 (*) 70 - 99 mg/dL  GLUCOSE,  CAPILLARY     Status: Abnormal   Collection Time    04/21/12  9:47 PM      Result Value Range   Glucose-Capillary 103 (*) 70 - 99 mg/dL   Comment 1 Notify RN    GLUCOSE, CAPILLARY     Status: Abnormal   Collection Time    04/22/12  7:16 AM      Result Value Range   Glucose-Capillary 105 (*) 70 - 99 mg/dL   Comment 1 Notify RN    GLUCOSE, CAPILLARY     Status: None   Collection Time    04/22/12  4:45 PM      Result Value Range   Glucose-Capillary 82  70 - 99 mg/dL  GLUCOSE, CAPILLARY     Status: None   Collection Time    04/22/12  8:51 PM      Result Value Range   Glucose-Capillary 86  70 - 99 mg/dL  CBC  Status: Abnormal   Collection Time    04/23/12  7:15 AM      Result Value Range   WBC 13.4 (*) 4.0 - 10.5 K/uL   RBC 2.34 (*) 4.22 - 5.81 MIL/uL   Hemoglobin 7.3 (*) 13.0 - 17.0 g/dL   HCT 16.1 (*) 09.6 - 04.5 %   MCV 93.6  78.0 - 100.0 fL   MCH 31.2  26.0 - 34.0 pg   MCHC 33.3  30.0 - 36.0 g/dL   RDW 40.9 (*) 81.1 - 91.4 %   Platelets 341  150 - 400 K/uL  BASIC METABOLIC PANEL     Status: Abnormal   Collection Time    04/23/12  7:15 AM      Result Value Range   Sodium 137  135 - 145 mEq/L   Potassium 3.9  3.5 - 5.1 mEq/L   Chloride 108  96 - 112 mEq/L   CO2 19  19 - 32 mEq/L   Glucose, Bld 95  70 - 99 mg/dL   BUN 31 (*) 6 - 23 mg/dL   Creatinine, Ser 7.82 (*) 0.50 - 1.35 mg/dL   Calcium 8.1 (*) 8.4 - 10.5 mg/dL   GFR calc non Af Amer 45 (*) >90 mL/min   GFR calc Af Amer 52 (*) >90 mL/min   Comment:            The eGFR has been calculated     using the CKD EPI equation.     This calculation has not been     validated in all clinical     situations.     eGFR's persistently     <90 mL/min signify     possible Chronic Kidney Disease.     HEENT: thrush Cardio: RRR and tachy Resp: Diminished breath sounds B/L and unlabored GI: BS positive and Non distended Extremity:  No Edema Skin:   Wound RLQ ileostomy with liquid output, ostomy site ok, Neuro:  Alert/Oriented, Anxious, Cranial Nerve II-XII normal, Normal Sensory and Abnormal Motor L delt 3-/5, bi,tri, grip 4-/5, R delt 1/5, R bi 0/5, R tri 3/5, R wrist ext 0/5, R hand intrinsics 0/5 abd, 3/5 add,2-/5 B HF, KE, 0/5 ankle DF, 3-/5 ankle eversion and PF Musc/Skel:  Other No pain with PROM of UE or LEs Gen:  anxious no acute distress 3+bilateral knee and 1+ bilateral ankle DTRs  Assessment/Plan: 1. 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 Physiatrist is providing close team supervision and 24 hour management of active medical problems listed below. Physiatrist and rehab team continue to assess barriers to discharge/monitor patient progress toward functional and medical goals. FIM: FIM - Bathing Bathing Steps Patient Completed: Chest Bathing: 1: Two helpers  FIM - Upper Body Dressing/Undressing Upper body dressing/undressing: 0: Wears gown/pajamas-no public clothing FIM - Lower Body Dressing/Undressing Lower body dressing/undressing: 0: Activity did not occur  FIM - Toileting Toileting: 1: Total-Patient completed zero steps, helper did all 3  FIM - Archivist Transfers: 0-Activity did not occur  FIM - Banker Devices: Sliding board;Bed rails;Orthosis Bed/Chair Transfer: 0: Activity did not occur  FIM - Locomotion: Wheelchair Distance: 150 in tilt in space w/c for positioning, pressure relief with head support and elevating LR to assist with BP control; full lap tray also placed for UE support Locomotion: Wheelchair: 0: Activity did not occur  FIM - Locomotion: Ambulation Ambulation/Gait Assistance: Not tested (comment) Locomotion: Ambulation: 0: Activity did not occur  Comprehension Comprehension Mode: Auditory Comprehension: 5-Follows basic  conversation/direction: With no assist  Expression Expression Mode: Verbal Expression: 2-Expresses basic 25 - 49% of the time/requires cueing 50 - 75% of the time. Uses single words/gestures.  Social Interaction Social Interaction Mode: Not assessed Social Interaction: 5-Interacts appropriately 90% of the time - Needs monitoring or encouragement for participation or interaction.  Problem Solving Problem Solving Mode: Not assessed Problem Solving: 6-Solves complex problems: With extra time  Memory Memory Mode: Not assessed Memory: 1-Recognizes or recalls less than 25% of the time/requires cueing greater than 75% of the time   Medical Problem List and Plan:  1. DVT Prophylaxis/Anticoagulation: Mechanical: Sequential compression devices, below knee Bilateral lower extremities thrombocytopenia resolved. 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 GIB 03/10.  -dopplers performed 3/7 were negative  2. Multifactorial pain: Stop MSIR 15mg  q4H prn Abdominal distention/ discomfort continuesTrial hydrocodone  3. Mood: Distracted due to multiple medical issues. Underlying cognitive issues also clouding the picture. Will need a lot of ego support by the team. Neuropsychologist and LCSW to follow additionally for evaluation and support once patient gets into a routine.  4. Neuropsych: This patient is not capable of making decisions on his/her own behalf.  5. Hypernatremia: due to high volume losses. Will add HS IV hydration as doubt he'll be able to maintain adequate hydration on liquids by tsp. Change IVF to D5W with KCL.  6. Bilateral cardioembolic CVA: no blood thinners due to issues with bleeding?  7. ABLA: will continue to monitor H/H and transfuse for hgb <7.0. Currently ranging from 7.6- 8.4 range. Will check anemia panel. MCV 102. May benefit from aranesp.  8. Critical illness neuropathy/myopathy-  Protect heels, stretch heel cords  -AFO's at some point when mobility justifies them 9.   Toxic megacolon s/p total colectomy.  Has high output ileostomy 10.Marland Kitchen Hypernatremia and hyperchloremia due to high output ileostomy 11.  Hypo K will supplement via IVF  LOS (Days) 6 A FACE TO FACE EVALUATION WAS PERFORMED  Connor Small 04/23/2012, 9:05 AM

## 2012-04-23 NOTE — Plan of Care (Signed)
Problem: RH BOWEL ELIMINATION Goal: RH STG MANAGE BOWEL W/EQUIPMENT W/ASSISTANCE STG Manage Bowel With Equipment With min Assistance of caregiver for ileostomy care  Outcome: Not Progressing Wife and staff managing

## 2012-04-23 NOTE — Progress Notes (Signed)
Speech Language Pathology Daily Session Note  Patient Details  Name: Connor Small MRN: [id number] Date of Birth: Jul 01, 1960  Today's Date: 04/23/2012 Time: 1130-1215 Time Calculation (min): 45 min  Short Term Goals: Week 1: SLP Short Term Goal 1 (Week 1): Patient will recall and utilize safe swallow compensatory strategies with mod assist verbal cues. SLP Short Term Goal 1 - Progress (Week 1): Progressing toward goal SLP Short Term Goal 2 (Week 1): Patient will complete oropharyngeal strengthening exercises x10 each with moderate verbal cues.   SLP Short Term Goal 2 - Progress (Week 1): Progressing toward goal SLP Short Term Goal 3 (Week 1): Patient will sustian attention to basic task for 3 minutes with with mod assist verbal cues for re-direction.  SLP Short Term Goal 3 - Progress (Week 1): Progressing toward goal SLP Short Term Goal 4 (Week 1): Patient will utilize external aids to recall daily information with mod assist verbal cues. SLP Short Term Goal 4 - Progress (Week 1): Progressing toward goal  Skilled Therapeutic Interventions: Skilled treatment session focused on addressing teaching and having patient return demonstration of diaphragmatic breathing and pharyngeal strengthening exercises.  Patient initially required max assist verbal, visual and tactile cues; however, SLP was able to fade cues to min assist level.  Patient also required max faded to mod assist clinician cues for active participation in session by attending to exercises by counting repetitions.  Family educated on recommendations to perform exercises 2-3 times a day, 10 reps each time.  Patient consumed thin liquid sips via cup with appropriate bolus size and demonstrated no overt s/s of aspiration.     FIM:  Comprehension Comprehension Mode: Auditory Comprehension: 5-Follows basic conversation/direction: With extra time/assistive device Expression Expression Mode: Verbal Expression: 3-Expresses basic 50 - 74%  of the time/requires cueing 25 - 50% of the time. Needs to repeat parts of sentences. Social Interaction Social Interaction: 4-Interacts appropriately 75 - 89% of the time - Needs redirection for appropriate language or to initiate interaction. Problem Solving Problem Solving: 3-Solves basic 50 - 74% of the time/requires cueing 25 - 49% of the time Memory Memory: 2-Recognizes or recalls 25 - 49% of the time/requires cueing 51 - 75% of the time  Pain Pain Assessment Pain Assessment: 0-10 Pain Score:   5 Pain Type: Acute pain Pain Location: Generalized Pain Descriptors: Aching Pain Frequency: Intermittent Pain Onset: Gradual Patients Stated Pain Goal: 3 Pain Intervention(s): Medication (See eMAR);Repositioned;Cold applied Multiple Pain Sites: No  Therapy/Group: Individual Therapy  Carmelia Roller., [place] [phone]  [place] 04/23/2012, 4:29 PM

## 2012-04-23 NOTE — Progress Notes (Signed)
Occupational Therapy Session Note  Patient Details  Name: Connor Small MRN: [id number] Date of Birth: August 06, 1960  Today's Date: 04/23/2012 Time: 1000-1100 Time Calculation (min): 60 min  Short Term Goals: Week 1:  OT Short Term Goal 1 (Week 1): Self Feeding:  Patient will feed himself at least 50% of 2 meals per day during this recording period. OT Short Term Goal 2 (Week 1): Grooming:  Patient will perform 2 grooming tasks with min assist to include set up once items needed are provided OT Short Term Goal 3 (Week 1): UB Dressing:  Patient will don shirt in supported sit with max assist OT Short Term Goal 4 (Week 1): Sitting balance:  Patient will sit without back support with mod assist while completing simple BADL task at least 2 days this recording period. OT Short Term Goal 5 (Week 1): UE exercises:  Patient will tolerate BUE exercises at least 4 times during this recording period.  Skilled Therapeutic Interventions/Progress Updates:  Patient in bed with wife and daughter at bedside with c/o nausea.  Wife reports that Dr. Letta Pate wants patient OOB today secondary to he needs to be mobilized so as not to get pneumonia.  Wife c/o IV needle is in patients left thumb and now he can't effectively try to use his left hand for groom, eat, scratch nose, etc.  RN to see if IV Team and try to find a different location.  Self care retraining to include total assist +2 for UB & LB dressing to include HOB up then supine and roll.  Patient assisted with parts of don shirt in supported sitting.  BUE P/AA/AROM exercises before Maxi Move lift used to transfer bed>tilt in space w/c with +3 due to IV.  Patient asked daughter to comb his hair therefore took that opportunity to continue to show patient and family that patient can perform some of the task of comb his hair with HOH assist of LUE and educated patient and daughter that when patient performs any self care task is is a form of exercise.  Patient up in  w/c with daughter at bedside, [place] RN present to perform ostomy bag change.  Patient less animated during session and not talking, at one point he said that he would just like to be at home.  Therapy Documentation Precautions:  Precautions Precautions: Shoulder;Fall Type of Shoulder Precautions: R shoulder subluxation Precaution Comments: Abd binder to protect incision, ostomy, tachycardia, Afib Required Braces or Orthoses: Other Brace/Splint Other Brace/Splint: R wrist cock up slint Restrictions Weight Bearing Restrictions: No Pain: Denies pain ADL: See FIM for current functional status  Therapy/Group: Individual Therapy  Mumin Denomme 04/23/2012, 12:07 PM

## 2012-04-23 NOTE — Consult Note (Signed)
PULMONARY  / CRITICAL CARE MEDICINE  Name: Connor Small MRN: [id number] DOB: 08-07-1960    ADMISSION DATE:  04/17/2012 CONSULTATION DATE: 3/29  REFERRING MD :  Dr Cato Mulligan PRIMARY SERVICE:  Possible asp   ADMISSION DATE: 02/26/2012  CONSULTATION DATE: 2/5  REFERRING MD : Karilyn Cota  CHIEF COMPLAINT: Acute resp failure   BRIEF PATIENT DESCRIPTION:  83 YOM admitted to [facility] for Cdiff on 2/2. Underwent decompressive cecostomy 2/3, Hospital course complicated by ARDS, PNA, septic shock, renal failure, wound dehiscence and required multiple OR visits. To [facility] rehab, aspiration event 3/29.  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 Overlying bowel gas and patientclinical status. 2. No explanation for elevated liver function tests.3. Similar to slight increase in size of a perisplenic fluidcollection.4. Right pleural effusion  2/20- reduction pressors, improved alertness  2/21- bleeding overnight from JP's, argatroban turned off, lower pressor needs  2/22- off pressors (yeah), on RA (yeah)  2/23- neg from JPs again, inr reducing  2/24- concern dehiscence , some improved renal fxn with increased  volume  2/24- possible dye change in output after dye 1 hr  2/25-relative brady  2/25 cysto neg injury  3/28 Called back to see on rehab for ? Asp  CULTURES:  3/29 BC x 2>>>  ANTIBIOTICS:  S/p full tratemtn 3 weeks cdiff Imipenem allerfy noted  Aztreonam 3/29>>> vanc 3/29>>>  Subjective: No distress  VITAL SIGNS: Temp:  [97.5 F (36.4 C)-98.1 F (36.7 C)] 97.5 F (36.4 C) (03/31 0557) Pulse Rate:  [94-99] 94 (03/31 0557) Resp:  [17-18] 18 (03/31 0557) BP: (96-103)/(56-72) 96/59 mmHg (03/31 0557) SpO2:  [98 %-99 %] 98 % (03/31 0557)    INTAKE / OUTPUT: Intake/Output     03/30 0701 - 03/31 0700 03/31 0701 - 04/01 0700   P.O. 135    I.V. (mL/kg) 2216.3 (30.8)    IV Piggyback 250    Total Intake(mL/kg) 2601.3 (36.2)    Urine (mL/kg/hr) 930 (0.5) 300 (0.7)   Emesis/NG output     Stool 630 (0.4) 150 (0.4)   Total Output 1560 450   Net +1041.3 -450        Urine Occurrence 1 x    Stool Occurrence 2 x      PHYSICAL EXAMINATION: General : awake, weak appearing Neck supple No jvd Lungs corase rt base RRR no s3  abd soft Ext in splints  LABS:  Recent Labs Lab 04/18/12 0525 04/19/12 0620 04/20/12 0555 04/21/12 0840 04/23/12 0715  HGB 7.2* 7.4*  --  7.8* 7.3*  WBC 9.4 10.3  --  14.7* 13.4*  PLT 275 317  --  350 341  NA 157* 156* 147* 148* 137  K 3.4* 3.1* 3.9 3.8 3.9  CL 126* 124* 115* 117* 108  CO2 21 23 22 20 19   GLUCOSE 108* 101* 332* 113* 95  BUN 36* 34* 31* 32* 31*  CREATININE 1.25 1.36* 1.43* 1.61* 1.67*  CALCIUM 8.4 8.6 8.2* 8.5 8.1*  AST 27  --   --  19  --   ALT 39  --   --  24  --   ALKPHOS 341*  --   --  297*  --   BILITOT 1.5*  --   --  1.5*  --   PROT 5.3*  --   --  5.6*  --   ALBUMIN 1.5*  --   --  1.6*  --     Recent Labs Lab 04/22/12 0716 04/22/12 1645 04/22/12 2051 04/23/12 0715 04/23/12 1132  GLUCAP 105* 82 86 99 84    CXR 3/29 1. New airspace opacity at the right lung base, favoring  pneumonia. Aspiration pneumonitis  is not excluded.  2. Continued large left pleural effusion with associated left  sided airspace opacity favoring atelectasis.   ASSESSMENT / PLAN: PULMONARY  A Lt pleural effusion.  New R as dz 3/29 c/w asp vs HCAP P:  IS Repeat pcxr in am for evolution rt base and left effusion follow up Increase activity as able    INFECTIOUS  A: C diff colitis, resolved clinically A  New asp vs HCAP 3/29 Remains culture neg also ARF, dc vanc Continue aztreonam pcxr in am   ATN / ARF/ Hypovolemia -agree continue free water Chem in am  Follow ostomy output closely, allow to be positive, same goal 1 lit pos again  Nausea -dc asa, no clear indication, echo resolved Limiting narcs, agree -zofran -follow up kub in am    Updated wife, and pt  Mcarthur Rossetti. Tyson Alias, MD, FACP Pgr: [phone] [facility]

## 2012-04-23 NOTE — Progress Notes (Signed)
Chaplain Note: Chaplain visited with pt and pt's wife.  Pt was awake, alert, oriented, resting in bed.  Pt's wife was at bedside.  Chaplain provided spiritual comfort, support, and prayer for pt and pt's wife.  Both expressed appreciation for chaplain support.  Chaplain will follow up. Spiritual Assessment : Pt's siprits were low as was his physical energy.  A recent bout of pneumonia and vomiting has drained him.  Pt continues to express faith and expectation of recovery; however, he is clearly spiritually and physically weary.  Pt's wife remains staunchly at pt's side maintaining a brave faade though also obviously experiencing exhauation of body and spirit.  This couple continues in need of daily spiritual care visits.    04/22/12 2000  Clinical Encounter Type  Visited With Patient and family together  Visit Type Spiritual support  Referral From Patient  Recommendations Pt requests daily visits for prayer  Spiritual Encounters  Spiritual Needs Prayer;Emotional  Stress Factors  Patient Stress Factors Exhausted;Major life changes  Family Stress Factors Exhausted;Loss of control;Major life changes  Jearld Lesch, Chaplain [phone]

## 2012-04-23 NOTE — Progress Notes (Signed)
Physical Therapy Session Note  Patient Details  Name: Connor Small MRN: [id number] Date of Birth: Sep 22, 1960  Today's Date: 04/23/2012 Time: 1330-1440 Time Calculation (min): 70 min  Short Term Goals: Week 1:  PT Short Term Goal 1 (Week 1): Patient will initate and assist with bed mobility 25% of the time for LE and UE positioning but perform with total A +1 PT Short Term Goal 2 (Week 1): Patient will performed bed <> w/c transfers with slideboard and max A with patient initiating and assisting 25% of the time PT Short Term Goal 3 (Week 1): Patient will perform dynamic sitting balance, head and trunk control training on mat with mod A overall for 5 minutes PT Short Term Goal 4 (Week 1): Will perform static standing in lift equipment (total A) for trunk/postural control and bilat LE WB x 5 minutes  Skilled Therapeutic Interventions/Progress Updates:   Patient already up in w/c; has been up since 1030 this am.  Patient reporting pain in buttocks, RN notified for pain meds.  Patient requesting to be put back to bed at end of session secondary to fatigue.  Focus on therapy session on patient initiation, attention to task and assisting with transfers.  Once in gym RN present with medication; patient became nauseous with medication administration and required extra time to let nausea settle prior to beginning transfers. Performed anterior scooting in w/c with max-total A with verbal cues for lateral leans but total A to advance hips anterior.  Performed transfers mat <> w/c with Beasy board and max-total A with focus on patient use of UE extension, trunk elongation and rotation to advance across board; patient required frequent cues to maintain head and trunk erect during anterior lean vs. Dependent forward flexion on therapist.  On mat focused on trunk control and sitting balance with UE support >> during unilateral/alternating dynamic UE movements>>no UE support during bilat UE dynamic movements with  mod-max A overall to maintain balance and tactile and verbal cues to hold head erect and for thoracic extension and lumbar lordosis with manual facilitation for shoulder ER; patient very fatigued and unable to hold position for > 10-15 seconds at a time; required one reclining rest break where patient performed AAROM to bilat UE into shoulder flexion to 90 deg x 10 reps each side.  Returned to bed with maxi move lift and +2 A and performed rolling in bed to remove sling and to position on R side for pressure relief with mod-max A to maintain flexed position of LE and for UE positioning.     Therapy Documentation Precautions:  Precautions Precautions: Shoulder;Fall Type of Shoulder Precautions: R shoulder subluxation Precaution Comments: Abd binder to protect incision, ostomy, tachycardia, Afib Required Braces or Orthoses: Other Brace/Splint Other Brace/Splint: R wrist cock up slint Restrictions Weight Bearing Restrictions: No Vital Signs: Therapy Vitals Temp: 98.1 F (36.7 C) Temp src: Oral Pulse Rate: 108 Resp: 18 BP: 92/66 mmHg Patient Position, if appropriate: Lying Oxygen Therapy SpO2: 98 % Pain: Pain Assessment Pain Score: 10-Worst pain ever Pain Type: Acute pain Pain Location: Buttocks Pain Descriptors: Aching;Sore Pain Intervention(s): RN made aware  See FIM for current functional status  Therapy/Group: Individual Therapy  [name] [facility] 04/23/2012, 5:13 PM

## 2012-04-23 NOTE — Consult Note (Signed)
WOC ostomy follow-up consult  Stoma type/location: Ileostomy stoma to right lower quad Stomal assessment/size: Stoma red and viable, flush with skin level, 1 inch.  Slight amt mucocutaneous separation is resolving.  Peristomal assessment: Intact skin surrounding Output: 50cc semi formed  Brown stool in pouch Ostomy pouching: 1pc.with barrier ring.  Education provided: Pt familiar with pouching routines from previous stoma.  Limited hand mobility at this time and requires total assistance with pouch application and emptying.  Daughter at bedside for pouch change demonstration.  Applied one piece with barrier ring and demonstrated opening and closing with velcro to daughter.  Wife has been present for other previous pouching sessions. Supplies at bedside for staff use, Julien Girt MSN, [place], [place]etha [place], [place], [place]

## 2012-04-24 ENCOUNTER — Inpatient Hospital Stay (HOSPITAL_COMMUNITY): Payer: BC Managed Care – PPO

## 2012-04-24 ENCOUNTER — Ambulatory Visit (HOSPITAL_COMMUNITY): Payer: BC Managed Care – PPO | Admitting: Physical Therapy

## 2012-04-24 ENCOUNTER — Inpatient Hospital Stay (HOSPITAL_COMMUNITY): Payer: BC Managed Care – PPO | Admitting: Occupational Therapy

## 2012-04-24 ENCOUNTER — Encounter (HOSPITAL_COMMUNITY): Payer: Self-pay | Admitting: Surgery

## 2012-04-24 ENCOUNTER — Inpatient Hospital Stay (HOSPITAL_COMMUNITY): Payer: BC Managed Care – PPO | Admitting: Physical Therapy

## 2012-04-24 DIAGNOSIS — K56609 Unspecified intestinal obstruction, unspecified as to partial versus complete obstruction: Secondary | ICD-10-CM

## 2012-04-24 DIAGNOSIS — R5381 Other malaise: Secondary | ICD-10-CM

## 2012-04-24 DIAGNOSIS — G6281 Critical illness polyneuropathy: Secondary | ICD-10-CM

## 2012-04-24 DIAGNOSIS — G6181 Chronic inflammatory demyelinating polyneuritis: Secondary | ICD-10-CM

## 2012-04-24 DIAGNOSIS — G7281 Critical illness myopathy: Secondary | ICD-10-CM

## 2012-04-24 DIAGNOSIS — Z932 Ileostomy status: Secondary | ICD-10-CM

## 2012-04-24 DIAGNOSIS — I69991 Dysphagia following unspecified cerebrovascular disease: Secondary | ICD-10-CM

## 2012-04-24 DIAGNOSIS — I6340 Cerebral infarction due to embolism of unspecified cerebral artery: Secondary | ICD-10-CM

## 2012-04-24 LAB — COMPREHENSIVE METABOLIC PANEL
Alkaline Phosphatase: 291 U/L — ABNORMAL HIGH (ref 39–117)
BUN: 28 mg/dL — ABNORMAL HIGH (ref 6–23)
Calcium: 8.1 mg/dL — ABNORMAL LOW (ref 8.4–10.5)
Creatinine, Ser: 1.61 mg/dL — ABNORMAL HIGH (ref 0.50–1.35)
GFR calc Af Amer: 54 mL/min — ABNORMAL LOW (ref >90)
Glucose, Bld: 77 mg/dL (ref 70–99)
Potassium: 3.9 mEq/L (ref 3.5–5.1)
Total Protein: 5.2 g/dL — ABNORMAL LOW (ref 6.0–8.3)

## 2012-04-24 LAB — CBC
RBC: 2.65 MIL/uL — ABNORMAL LOW (ref 4.22–5.81)
WBC: 15.6 10*3/uL — ABNORMAL HIGH (ref 4.0–10.5)

## 2012-04-24 LAB — GLUCOSE, CAPILLARY
Glucose-Capillary: 78 mg/dL (ref 70–99)
Glucose-Capillary: 72 mg/dL (ref 70–99)

## 2012-04-24 MED ORDER — SIMETHICONE 40 MG/0.6ML PO SUSP
80.0000 mg | Freq: Four times a day (QID) | ORAL | Status: DC | PRN
  Filled 2012-04-24: qty 1.2

## 2012-04-24 MED ORDER — SACCHAROMYCES BOULARDII 250 MG PO CAPS
250.0000 mg | ORAL_CAPSULE | Freq: Two times a day (BID) | ORAL | Status: DC
  Filled 2012-04-24 (×4): qty 1

## 2012-04-24 MED ORDER — PROCHLORPERAZINE EDISYLATE 5 MG/ML IJ SOLN
10.0000 mg | Freq: Four times a day (QID) | INTRAMUSCULAR | Status: DC
  Administered 2012-04-24 – 2012-04-25 (×5): 10 mg via INTRAVENOUS
  Filled 2012-04-24 (×9): qty 2

## 2012-04-24 MED ORDER — LOPERAMIDE HCL 2 MG PO CAPS
2.0000 mg | ORAL_CAPSULE | Freq: Three times a day (TID) | ORAL | Status: DC | PRN
  Filled 2012-04-24: qty 2

## 2012-04-24 MED ORDER — ONDANSETRON 8 MG/NS 50 ML IVPB
8.0000 mg | Freq: Four times a day (QID) | INTRAVENOUS | Status: DC
  Filled 2012-04-24 (×2): qty 8

## 2012-04-24 MED ORDER — PHENOL 1.4 % MT LIQD
2.0000 | OROMUCOSAL | Status: DC | PRN
  Filled 2012-04-24: qty 177

## 2012-04-24 MED ORDER — IOHEXOL 300 MG/ML  SOLN
25.0000 mL | INTRAMUSCULAR | Status: AC
  Administered 2012-04-24 (×2): 25 mL via ORAL

## 2012-04-24 MED ORDER — BLISTEX EX OINT
TOPICAL_OINTMENT | Freq: Two times a day (BID) | CUTANEOUS | Status: DC
  Administered 2012-04-24 – 2012-04-25 (×2): via TOPICAL
  Filled 2012-04-24: qty 10

## 2012-04-24 MED ORDER — LACTATED RINGERS IV BOLUS (SEPSIS): 1000.0000 mL | Freq: Three times a day (TID) | INTRAVENOUS | Status: DC | PRN

## 2012-04-24 MED ORDER — MAGIC MOUTHWASH
15.0000 mL | Freq: Four times a day (QID) | ORAL | Status: DC | PRN
  Filled 2012-04-24: qty 15

## 2012-04-24 MED ORDER — MENTHOL 3 MG MT LOZG
1.0000 | LOZENGE | OROMUCOSAL | Status: DC | PRN
  Filled 2012-04-24: qty 9

## 2012-04-24 MED ORDER — LACTATED RINGERS IV BOLUS (SEPSIS)
1000.0000 mL | Freq: Once | INTRAVENOUS | Status: AC
  Administered 2012-04-24: 1000 mL via INTRAVENOUS

## 2012-04-24 MED ORDER — LIP MEDEX EX OINT
1.0000 "application " | TOPICAL_OINTMENT | Freq: Two times a day (BID) | CUTANEOUS | Status: DC
  Filled 2012-04-24: qty 7

## 2012-04-24 MED ORDER — ONDANSETRON 8 MG PO TBDP
8.0000 mg | ORAL_TABLET | Freq: Four times a day (QID) | ORAL | Status: DC
  Filled 2012-04-24 (×4): qty 1

## 2012-04-24 MED ORDER — POTASSIUM CHLORIDE 2 MEQ/ML IV SOLN
INTRAVENOUS | Status: DC
  Filled 2012-04-24 (×2): qty 1000

## 2012-04-24 MED ORDER — BISMUTH SUBSALICYLATE 262 MG/15ML PO SUSP
30.0000 mL | Freq: Three times a day (TID) | ORAL | Status: DC | PRN
  Filled 2012-04-24: qty 236

## 2012-04-24 NOTE — Progress Notes (Signed)
Occupational Therapy  Session Note  Patient Details  Name: Connor Small MRN: [id number] Date of Birth: May 08, 1960  Today's Date: 04/24/2012 Time: (60 min co-treat with AH-PT),  0900-0930 and 1100-1130 Time Calculation (min): 30 min (CKS) and 30 min  Short Term Goals: Week 1:  OT Short Term Goal 1 (Week 1): Self Feeding:  Patient will feed himself at least 50% of 2 meals per day during this recording period. OT Short Term Goal 2 (Week 1): Grooming:  Patient will perform 2 grooming tasks with min assist to include set up once items needed are provided OT Short Term Goal 3 (Week 1): UB Dressing:  Patient will don shirt in supported sit with max assist OT Short Term Goal 4 (Week 1): Sitting balance:  Patient will sit without back support with mod assist while completing simple BADL task at least 2 days this recording period. OT Short Term Goal 5 (Week 1): UE exercises:  Patient will tolerate BUE exercises at least 4 times during this recording period.  Skilled Therapeutic Interventions/Progress Updates:  1)  Self care retraining, functional mobility, sitting balance and transfer co-treat session with PT.  Focused on patient actively participating, initiating and sequencing through each task and/or movement such as rolling to assist RN to administer suppository and for LB dressing with patient able to initiate movements of extremities with assistance to prepare for and initiate rolling with mod-max cues and max-total assist, static and dynamic sitting balance and postural control EOB with occasional use of UEs for support however patient attempting to perform portions of UB dressing yet unable to fully perform any step of UB dressing completely on his own as well as required max-total vcs on how to donn his shirt, lateral lean to right for Zinc board placement.  Patient with c/o pain in bottom when bed is deflated for safe transfers therefore a pad was placed under his bottom so he could tolerate sitting  EOB.  Reviewed with patient need for pressure relief to his bottom to be performed every 30 min by tilting the w/c back.  Patient not talkative in session, often keeping his eyes closed and when asked if we could do anything for him before we left, he stated, "just shoot me".   2)  Patient up in w/c and sleeping with wife and friend at his side.  Wife reports that patient has been complaining about his bottom hurting and she reports having difficulty making the chair recline/tilt back far enough. Demonstrated how to recline chair for wife.  Issued red foam to be placed of eating utensil as built up handle to increase independence with self feeding.  Patient still with nausea and poor appetite therefore we did not practice with real food however patient performed the steps of scooping food off on a plate and bringing spoon to his mouth and needed hand over hand minimal assistance at wrist and at elbow due to LUE weakness.  Wife observed and stated, "we can do that" then she stepped out for the session.  BUE P/AA/AROM exercises as well as some place and hold and able to take minimal resistance with some LUE exercises distal to the shoulder. Lateral leans with total assist to perform pressure relief on buttocks.  Items within reach and wife notified of session completion.  Therapy Documentation Precautions:  Precautions Precautions: Shoulder;Fall Type of Shoulder Precautions: R shoulder subluxation Precaution Comments: Abd binder to protect incision, ostomy, tachycardia, Afib Required Braces or Orthoses: Other Brace/Splint Other Brace/Splint: R wrist  cock up slint Restrictions Weight Bearing Restrictions: No Pain: C/o headache-not rated, RN aware and medication provided ADL: See FIM for current functional status  Therapy/Group: Co-Treatment with AH-PT  Velda Wendt 04/24/2012, 10:27 AM

## 2012-04-24 NOTE — Progress Notes (Signed)
Pt not eating complaining of constant nausea, #10 NG placed per MD order, placement confirmed with Xray, CT contrast given via NG tube, pt tolerated with out difficulty, IV access lost due to IV infiltration, IV team called for an IV restart. Pt had relief of nausea with NG placement connected to intermittent suction

## 2012-04-24 NOTE — Progress Notes (Signed)
SLP Cancellation Note  Patient Details Name: Connor Small MRN: [id number] DOB: 1960-02-25   Cancelled treatment:        Pt in procedure having NG tube placed. SLP assisted pt and RN but no skilled therapy provided.  Pt to pt NPO at least for next 24 hours. Pt and MD stressed importance of maintaining swallow function while trying to resolve GI issues. SLP to continue therapy with ice chips and exercises until diet resumed.    Jacquez Sheetz, Katherene Ponto 04/24/2012, 2:52 PM

## 2012-04-24 NOTE — Progress Notes (Signed)
Patient ID: [name], male   DOB: [birth year], 52 y.o.   MRN: [id number] 52 y.o. right-handed male with history of ulcerative colitis as well as prior diverticular perforation, who was admitted to [facility] on 02/26/2012 with low back pain, fever 102.5 and multiple episodes of watery diarrhea. Underwent flexible sigmoidoscopy 02/27/2012 per Dr.Rehman with findings of multiple pseudomembranes consistent with C. difficile colitis as well as low colorectal stricture. Underwent exploratory laparotomy,cecostomy tube placement per Dr. Dian Situ 02/27/2012. 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 Post op he developed ARDS with septic shock as well as renal failure due to AKI. On 02/06 he developed new onset Atrial fibrillation with RVR and was transferred to [facility] for treatment. Dr. Lorie Phenix consulted for input on metabolic acidosis with anasarca and non-oliguric ARF likely due to ischemic ATN in setting of hypotension and CRRT initiated. He was started on stress dose steroids and was treated with broad spectrum antibiotics. Dr. Derrell Lolling consulted for input and with close monitoring of wound. Wound evisceration treated with placement of retention sutures and wound closure on 3/9 by Dr. Johna Sheriff.  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 Neurology consulted for input global weakness and proximal RUE weakness. 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 Ct cervical spine with degenerative changes and large left pleural effusion. Dr. Kathrynn Running consulted for possible ureteral injury due to high output from JP drain and cysto with pyelogram revealed no filling defects or extravasation. Dr. Madilyn Fireman consulted for input on abnormal LFTs. 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 Dr. Pearlean Brownie recommended ASA for now and anticoagulation once thrombocytopenia/LFTs resolve and patient able to swallow safely. Dr Ophelia Charter consulted due to right shoulder pain and inferior subluxation on xray. He felt inferior position due to RUE weakness and not dislocation He developed hypotension with GIB requiring multiple units PRBC on 3/10. He continues with high output from ileostomy with hypernatremia. Panda discontinued on 03/24 And follow up MBS done today due to dysphagia. Diet advanced to D3, thin liquids by tsp. He has had oral discomfort and was started on diflucan for thrush.  Subjective/Complaints: Problem with pneumonia over the weekend. Reevaluated by pulmonary. No transfer recommended. Continue IV antibiotics and IV Fluids Nausea , not keeping meds down, occ vomiting Coughed last noc but not now   Objective: Vital Signs: Blood pressure 100/65, pulse 100, temperature 98.5 F (36.9 C), temperature source Oral, resp. rate 16, height 5\' 10"  (1.778 m), weight 71.94 kg (158 lb 9.6 oz), SpO2 100.00%.     Results for orders placed during the hospital encounter of 04/17/12 (from the past 72 hour(s))  CBC WITH DIFFERENTIAL     Status: Abnormal   Collection Time    04/21/12  8:40 AM      Result Value Range   WBC 14.7 (*) 4.0 - 10.5 K/uL   RBC 2.52 (*) 4.22 - 5.81 MIL/uL   Hemoglobin 7.8 (*) 13.0 - 17.0 g/dL   HCT 69.6 (*) 29.5 - 28.4 %   MCV 98.0  78.0 - 100.0 fL   MCH 31.0  26.0 - 34.0 pg  MCHC 31.6  30.0 - 36.0 g/dL   RDW 16.1 (*) 09.6 - 04.5 %   Platelets 350  150 - 400 K/uL   Neutrophils Relative 89 (*) 43 - 77 %   Lymphocytes Relative 6 (*) 12 - 46 %   Monocytes Relative 5  3 - 12 %   Eosinophils Relative 0  0 - 5 %   Basophils  Relative 0  0 - 1 %   Neutro Abs 13.1 (*) 1.7 - 7.7 K/uL   Lymphs Abs 0.9  0.7 - 4.0 K/uL   Monocytes Absolute 0.7  0.1 - 1.0 K/uL   Eosinophils Absolute 0.0  0.0 - 0.7 K/uL   Basophils Absolute 0.0  0.0 - 0.1 K/uL   RBC Morphology POLYCHROMASIA PRESENT     WBC Morphology INCREASED BANDS (>20% BANDS)     Comment: TOXIC GRANULATION     DOHLE BODIES  CULTURE, BLOOD (ROUTINE X 2)     Status: None   Collection Time    04/21/12  8:40 AM      Result Value Range   Specimen Description BLOOD LEFT ARM     Special Requests BOTTLES DRAWN AEROBIC AND ANAEROBIC 10CC     Culture  Setup Time 04/21/2012 18:02     Culture       Value:        BLOOD CULTURE RECEIVED NO GROWTH TO DATE CULTURE WILL BE HELD FOR 5 DAYS BEFORE ISSUING A FINAL NEGATIVE REPORT   Report Status PENDING    COMPREHENSIVE METABOLIC PANEL     Status: Abnormal   Collection Time    04/21/12  8:40 AM      Result Value Range   Sodium 148 (*) 135 - 145 mEq/L   Potassium 3.8  3.5 - 5.1 mEq/L   Chloride 117 (*) 96 - 112 mEq/L   CO2 20  19 - 32 mEq/L   Glucose, Bld 113 (*) 70 - 99 mg/dL   BUN 32 (*) 6 - 23 mg/dL   Creatinine, Ser 4.09 (*) 0.50 - 1.35 mg/dL   Calcium 8.5  8.4 - 81.1 mg/dL   Total Protein 5.6 (*) 6.0 - 8.3 g/dL   Albumin 1.6 (*) 3.5 - 5.2 g/dL   AST 19  0 - 37 U/L   ALT 24  0 - 53 U/L   Alkaline Phosphatase 297 (*) 39 - 117 U/L   Total Bilirubin 1.5 (*) 0.3 - 1.2 mg/dL   GFR calc non Af Amer 47 (*) >90 mL/min   GFR calc Af Amer 54 (*) >90 mL/min   Comment:            The eGFR has been calculated     using the CKD EPI equation.     This calculation has not been     validated in all clinical     situations.     eGFR's persistently     <90 mL/min signify     possible Chronic Kidney Disease.  CULTURE, BLOOD (ROUTINE X 2)     Status: None   Collection Time    04/21/12  8:50 AM      Result Value Range   Specimen Description BLOOD LEFT ARM     Special Requests BOTTLES DRAWN AEROBIC ONLY 10CC     Culture  Setup  Time 04/21/2012 18:02     Culture       Value:        BLOOD CULTURE RECEIVED NO GROWTH TO DATE CULTURE  WILL BE HELD FOR 5 DAYS BEFORE ISSUING A FINAL NEGATIVE REPORT   Report Status PENDING    GLUCOSE, CAPILLARY     Status: Abnormal   Collection Time    04/21/12 11:17 AM      Result Value Range   Glucose-Capillary 165 (*) 70 - 99 mg/dL  GLUCOSE, CAPILLARY     Status: Abnormal   Collection Time    04/21/12  4:38 PM      Result Value Range   Glucose-Capillary 116 (*) 70 - 99 mg/dL  GLUCOSE, CAPILLARY     Status: Abnormal   Collection Time    04/21/12  9:47 PM      Result Value Range   Glucose-Capillary 103 (*) 70 - 99 mg/dL   Comment 1 Notify RN    GLUCOSE, CAPILLARY     Status: Abnormal   Collection Time    04/22/12  7:16 AM      Result Value Range   Glucose-Capillary 105 (*) 70 - 99 mg/dL   Comment 1 Notify RN    GLUCOSE, CAPILLARY     Status: None   Collection Time    04/22/12  4:45 PM      Result Value Range   Glucose-Capillary 82  70 - 99 mg/dL  GLUCOSE, CAPILLARY     Status: None   Collection Time    04/22/12  8:51 PM      Result Value Range   Glucose-Capillary 86  70 - 99 mg/dL  CBC     Status: Abnormal   Collection Time    04/23/12  7:15 AM      Result Value Range   WBC 13.4 (*) 4.0 - 10.5 K/uL   RBC 2.34 (*) 4.22 - 5.81 MIL/uL   Hemoglobin 7.3 (*) 13.0 - 17.0 g/dL   HCT 40.9 (*) 81.1 - 91.4 %   MCV 93.6  78.0 - 100.0 fL   MCH 31.2  26.0 - 34.0 pg   MCHC 33.3  30.0 - 36.0 g/dL   RDW 78.2 (*) 95.6 - 21.3 %   Platelets 341  150 - 400 K/uL  BASIC METABOLIC PANEL     Status: Abnormal   Collection Time    04/23/12  7:15 AM      Result Value Range   Sodium 137  135 - 145 mEq/L   Potassium 3.9  3.5 - 5.1 mEq/L   Chloride 108  96 - 112 mEq/L   CO2 19  19 - 32 mEq/L   Glucose, Bld 95  70 - 99 mg/dL   BUN 31 (*) 6 - 23 mg/dL   Creatinine, Ser 0.86 (*) 0.50 - 1.35 mg/dL   Calcium 8.1 (*) 8.4 - 10.5 mg/dL   GFR calc non Af Amer 45 (*) >90 mL/min   GFR calc Af  Amer 52 (*) >90 mL/min   Comment:            The eGFR has been calculated     using the CKD EPI equation.     This calculation has not been     validated in all clinical     situations.     eGFR's persistently     <90 mL/min signify     possible Chronic Kidney Disease.  GLUCOSE, CAPILLARY     Status: None   Collection Time    04/23/12  7:15 AM      Result Value Range   Glucose-Capillary 99  70 - 99 mg/dL  GLUCOSE, CAPILLARY  Status: None   Collection Time    04/23/12 11:32 AM      Result Value Range   Glucose-Capillary 84  70 - 99 mg/dL   Comment 1 Notify RN    GLUCOSE, CAPILLARY     Status: None   Collection Time    04/23/12  4:47 PM      Result Value Range   Glucose-Capillary 84  70 - 99 mg/dL   Comment 1 Notify RN    GLUCOSE, CAPILLARY     Status: None   Collection Time    04/23/12  9:11 PM      Result Value Range   Glucose-Capillary 76  70 - 99 mg/dL  GLUCOSE, CAPILLARY     Status: None   Collection Time    04/24/12  7:22 AM      Result Value Range   Glucose-Capillary 88  70 - 99 mg/dL   Comment 1 Notify RN       HEENT: thrush Cardio: RRR and tachy Resp: Diminished breath sounds B/L and unlabored GI: BS positive and distended with mod tenderness, wound looks better Extremity:  No Edema Skin:   Wound RLQ ileostomy with liquid output, ostomy site ok, Neuro: Alert/Oriented, Anxious, Cranial Nerve II-XII normal, Normal Sensory and Abnormal Motor L delt 3-/5, bi,tri, grip 4-/5, R delt 1/5, R bi 0/5, R tri 3/5, R wrist ext 0/5, R hand intrinsics 0/5 abd, 3/5 add,2-/5 B HF, KE, 0/5 ankle DF, 3-/5 ankle eversion and PF Musc/Skel:  Other No pain with PROM of UE or LEs Gen:  anxious no acute distress 3+bilateral knee and 1+ bilateral ankle DTRs  Assessment/Plan: 1. 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 Physiatrist is providing close team supervision and 24 hour management of active medical problems listed below. Physiatrist and rehab team continue to assess barriers to discharge/monitor patient progress toward functional and medical goals. FIM: FIM - Bathing Bathing Steps Patient Completed: Chest Bathing: 1: Two helpers  FIM - Upper Body Dressing/Undressing Upper body dressing/undressing: 0: Wears gown/pajamas-no public clothing FIM - Lower Body Dressing/Undressing Lower body dressing/undressing: 0: Activity did not occur  FIM - Toileting Toileting: 1: Total-Patient completed zero steps, helper did all 3  FIM - Archivist Transfers: 0-Activity did not occur  FIM - Banker Devices: Sliding board Bed/Chair Transfer: 1: Mechanical lift;1: Bed > Chair or W/C: Total A (helper does all/Pt. < 25%);1: Chair or W/C > Bed: Total A (helper does all/Pt. < 25%)  FIM - Locomotion: Wheelchair Distance: 150 in tilt in space w/c for positioning, pressure relief with head support and elevating LR to assist with BP control; full lap tray also placed for UE support Locomotion: Wheelchair: 1: Total Assistance/staff pushes wheelchair (Pt<25%) FIM - Locomotion: Ambulation Ambulation/Gait Assistance: Not tested (comment) Locomotion: Ambulation: 0: Activity did not occur  Comprehension Comprehension Mode: Auditory Comprehension: 5-Follows basic conversation/direction: With extra time/assistive device  Expression Expression Mode: Verbal Expression: 3-Expresses basic 50 - 74% of the time/requires cueing 25 - 50% of the time. Needs to repeat parts of sentences.  Social Interaction Social Interaction Mode: Not assessed Social Interaction: 4-Interacts appropriately 75 - 89% of the time - Needs redirection for appropriate language or to initiate interaction.  Problem Solving Problem Solving Mode: Not assessed Problem Solving: 3-Solves basic  50 - 74% of the time/requires cueing 25 -  49% of the time  Memory Memory Mode: Not assessed Memory: 2-Recognizes or recalls 25 - 49% of the time/requires cueing 51 - 75% of the time   Medical Problem List and Plan:  1. DVT Prophylaxis/Anticoagulation: Mechanical: Sequential compression devices, below knee Bilateral lower extremities thrombocytopenia resolved. 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 GIB 03/10.  -dopplers performed 3/7 were negative  2. Multifactorial pain: Stop MSIR 15mg  q4H prn Abdominal distention/ discomfort continuesTrial hydrocodone  3. Mood: Distracted due to multiple medical issues. Underlying cognitive issues also clouding the picture. Will need a lot of ego support by the team. Neuropsychologist and LCSW to follow additionally for evaluation and support once patient gets into a routine.  4. Neuropsych: This patient is not capable of making decisions on his/her own behalf.  5. Hypernatremia: due to high volume losses. Will add HS IV hydration as doubt he'll be able to maintain adequate hydration on liquids by tsp. Change IVF to D5W with KCL.  6. Bilateral cardioembolic CVA: no blood thinners due to issues with bleeding?  7. ABLA: will continue to monitor H/H and transfuse for hgb <7.0. Currently ranging from 7.6- 8.4 range. Will check anemia panel. MCV 102. May benefit from aranesp.  8. Critical illness neuropathy/myopathy-  Protect heels, stretch heel cords  -AFO's at some point when mobility justifies them 9.  Toxic megacolon s/p total colectomy.  Has high output ileostomy but now reduced with reduced intake,. ? Developing ileus,Past general surgery to reevaluate 10.Marland Kitchen Hypernatremia and hyperchloremia due to high output ileostomy, resolved on IVF hypotonic soln 11.  Hypo K will supplement via IVF 12.right lower lobe pneumonia appreciate pulmonary assistance LOS (Days) 7 A FACE TO FACE EVALUATION WAS PERFORMED  KIRSTEINS,ANDREW E 04/24/2012, 8:32 AM

## 2012-04-24 NOTE — Progress Notes (Signed)
Physical Therapy Weekly Progress Note  Patient Details  Name: Connor Small MRN: [id number] Date of Birth: 01/24/1961  Today's Date: 04/24/2012 Time: 0930 (60 min co treat with CKS-OT)-1000 Time Calculation (min): 30 min  PM session: Patient missed 60 min pm PT secondary to NGT placement, xray and CT of abdomen.  Will f/u tomorrow.  Patient has made slow but steady progress and has met 4 of 5 short term goals.  Patient still requires total A of one to two people and intermittent use of lift equipment for safe bed mobility, bed <> w/c transfers and static standing activities, mod-max A sitting balance and total A of tilt in space w/c for mobility and pressure relief.  Patient participation has been limited secondary to significant deconditioning and reduced activity tolerance, pain and nausea.  Patient has been placed on 15 hours of therapy over 7 days to maximize tolerance and participation in therapy and an OOB chart has been initiated with the goal of 2 hours OOB daily.    Patient continues to demonstrate the following deficits: tetraplegia with significant atrophy and weakness bilat UE and LE R > L weakness, impaired motor control, timing, sequencing, impaired coordination, apraxia, impaired activity tolerance/endurance, pain, impaired attention, memory, impaired head and postural control, static and dynamic balance, gait and therefore will continue to benefit from skilled PT intervention to enhance overall performance with activity tolerance, balance, postural control, ability to compensate for deficits, functional use of  right upper extremity, right lower extremity, left upper extremity and left lower extremity, attention and coordination.  Patient progressing toward long term goals..  Continue plan of care.  PT Short Term Goals Week 1:  PT Short Term Goal 1 (Week 1): Patient will initate and assist with bed mobility 25% of the time for LE and UE positioning but perform with total A +1 PT  Short Term Goal 1 - Progress (Week 1): Met PT Short Term Goal 2 (Week 1): Patient will performed bed <> w/c transfers with slideboard and max A with patient initiating and assisting 25% of the time PT Short Term Goal 2 - Progress (Week 1): Progressing toward goal PT Short Term Goal 3 (Week 1): Patient will perform dynamic sitting balance, head and trunk control training on mat with mod A overall for 5 minutes PT Short Term Goal 3 - Progress (Week 1): Met PT Short Term Goal 4 (Week 1): Will perform static standing in lift equipment (total A) for trunk/postural control and bilat LE WB x 5 minutes Week 2:  PT Short Term Goal 1 (Week 2): Patient will perform bed mobility/rolling with max A but with 50% cues for initiation and sequencing PT Short Term Goal 2 (Week 2): Patient will performed bed <> w/c transfers with slideboard and consistent max A but with 50% cues for initiation and sequencing PT Short Term Goal 3 (Week 2): Patient will tolerate 4 hours up in tilt in space w/c and ask for pressure relief from wife or staff at appropriate times PT Short Term Goal 4 (Week 2): Patient will perform dynamic sitting balance and trunk control activities without back support 8-10 minutes with min-mod A overall  PT Short Term Goal 5 (Week 2): Patient will tolarate standing balance and WB activities with total A/lift equipment x 10 minutes  Skilled Therapeutic Interventions/Progress Updates:   AM session: co-treat with OT with focus on patient initiation, participation and sequencing for rolling in bed to don pants and abdominal binder and to assist with nursing care  with patient requiring max A to complete LE and UE positioning with max cues for attention and initiation.  Performed supine > sit EOB with HOB elevated and cushion under buttocks while bed deflated with +2 A.  Assisted with patient trunk control and balance EOB with min-mod A overall during upper body dressing.  Performed transfer bed > w/c with BEASY  board and +2 A with verbal cues for patient hand placement, initiation and activation to advance buttocks into w/c.  Bilat PRAFO donned for prolonged bilat gastroc stretching to prepare for standing this pm.  Reviewed with patient how often to request pressure relief on buttocks while in w/c.     Therapy Documentation Precautions:  Precautions Precautions: Shoulder;Fall Type of Shoulder Precautions: R shoulder subluxation Precaution Comments: Abd binder to protect incision, ostomy, tachycardia, Afib Required Braces or Orthoses: Other Brace/Splint Other Brace/Splint: R wrist cock up slint Restrictions Weight Bearing Restrictions: No  See FIM for current functional status  Therapy/Group: Individual Therapy and Co-Treatment  Edman Circle [facility] 04/24/2012, 12:36 PM

## 2012-04-24 NOTE — Progress Notes (Addendum)
NUTRITION FOLLOW UP  DOCUMENTATION CODES  Per approved criteria   -Severe malnutrition in the context of acute illness or injury    Intervention:    1. Continue Resource Breeze po prn and Ensure Complete po prn. 2. Discussed oral intake extensively with wife at bedside; STRONGLY RECOMMEND INITIATION OF NUTRITION SUPPORT. The American Society for Parenteral and Enteral Nutrition (A.S.P.E.N.) and the Society of [facility]) nutrition guidelines state that for those who cannot take adequate calories with oral supplementation alone, enteral nutrition via feeding tube should be considered and is preferred over parenteral nutrition if the gastrointestinal tract is functional. Enteral vs parenteral nutrition per team discretion. 3. Monitor magnesium, potassium, and phosphorus daily for at least 3 days, MD to replete as needed, as pt is at risk for refeeding syndrome given ongoing severe malnutrition. 4. RD to continue to follow nutrition care plan  Nutrition Dx:   Inadequate oral intake related to swallowing difficulty as evidenced by dietary recall and ongoing weight loss. Ongoing.  Goal:   Intake to meet >90% of estimated nutrition needs. Unmet.  Monitor:   weight trends, lab trends, I/O's, PO intake, supplement tolerance  Assessment:   Hx of ulcerative colitis and diverticular perforation. Admitted to [facility] on 2/2 with low back pain, fever, and multiple episodes of watery diarrhea. Work-up revealed multiple pseudomembranes consistent with C. difficile colitis and low colorectal stricture. Underwent exploratory laparotomy with cecostomy tube placement 2/3. 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 Post-op he developed ARDS with septic shock as well as renal failure due to AKI. On 2/6 he developed new onset a-fib with RVR and was transferred to [facility]. Renal consulted for metabolic acidosis with  anasarca and non-oliguric ARF likely due to ischemic ATN in setting of hypotension. CRRT initiated. He was started on stress dose steroids and was treated with broad spectrum antibiotics. Wound evisceration treated with placement of retention sutures and wound closure. 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 Ct cervical spine with degenerative changes and large left pleural effusion. 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 He developed hypotension with GIB requiring multiple units PRBC on 3/10. He continues with high output from ileostomy with hypernatremia. Panda discontinued on 3/24; f/u MBS done yesterday with recommendations of Dysphagia 3 diet with thin liquids by tsp. He has had oral discomfort and was started on diflucan for thrush. Pain control remains an issue.  Pt followed by RD staff during acute hospitalization. Pt with variable wt since admission. Admission wt 200 lbs, wife reports that this is patient's baseline weight. Pt underwent permissive underfeeding x 3 weeks as appropriate for critically ill obese pt and initiated advancement to full feeds (2/21-2/26). Feeds were held for surgery (2/26-3/3), resumed with pt reaching goal rate (3/4). TFs again held (3/11) for GI bleed, resumed (3/12). Pt was dx with severe malnutrition of acute illness by RD 2 days ago given 17.5% wt loss in >1 month and PO/nutrition support meeting </=75% of estimated needs for >1 month. Continues to meet criteria for this at this time.  Rapid response contacted on 3/29 as pt had fever and wife states pt was having increased dependent edema and vomiting throughout the night. CCM consulted for possible aspiration; they noted nothing new to offer and pt did not require transfer to their service.  Ordered for IVF of D5 with KCl over 12 hours nocturnally.  Continues with prn supplements of Ensure Complete and Resource Breeze. Meal intake remains suboptimal, consuming bites of meals. Discussed concerns with wife at bedside, she notes that pt ate next to nothing over the entire weekend. Pt will get nauseated with even one cracker. Discussed nutrition concerns with Reesa Chew, PA. She recommended calorie count and possible initiation of TPN. Discussed calorie count initiation with wife and she notes that a calorie count would not be a good use of patient's time as he is not consuming anything and she would like nutrition support now. Pt's wife has told this RD several times that she does not want NGT to be re-inserted, PA is aware.  Height: Ht Readings from Last 1 Encounters:  04/17/12 5' 10"  (1.778 m)    Weight Status:   Wt Readings from Last 1 Encounters:  04/18/12 158 lb 9.6 oz (71.94 kg)    Re-estimated needs:  Kcal: 2200 - 2400 Protein: 140 - 160 grams Fluid: at least 2.3 liters daily  Skin: stage II R arm, stage I sacrum  Diet Order: Dysphagia 3; thin liquids   Intake/Output Summary (Last 24 hours) at 04/24/12 0938 Last data filed at 04/24/12 0200  Gross per 24 hour  Intake    120 ml  Output    750 ml  Net   -630 ml    Last BM: 3/31 (150 ml output from ostomy yesterday)  Labs:   Recent Labs Lab 04/20/12 0555 04/21/12 0840 04/23/12 0715  NA 147* 148* 137  K 3.9 3.8 3.9  CL 115* 117* 108  CO2 22 20 19   BUN 31* 32* 31*  CREATININE 1.43* 1.61* 1.67*  CALCIUM 8.2* 8.5 8.1*  GLUCOSE 332* 113* 95    CBG (last 3)   Recent Labs  04/23/12 1647 04/23/12 2111 04/24/12 0722  GLUCAP 84 76 88    Scheduled Meds: . aztreonam  1 g Intravenous Q8H  . chlorhexidine  15 mL Mouth Rinse BID  . dextrose 5 % 1,000 mL with potassium chloride 20 mEq infusion   Intravenous Custom  . insulin aspart  0-5 Units Subcutaneous QHS  . insulin aspart  0-9 Units Subcutaneous TID WC  . lidocaine  1 patch Transdermal Q24H  .  pantoprazole (PROTONIX) IV  40 mg Intravenous Q12H  . prochlorperazine  10 mg Intravenous Q6H    Continuous Infusions:  none  Inda Coke MS, RD, LDN Pager: [phone] After-hours pager: [phone]

## 2012-04-24 NOTE — Progress Notes (Addendum)
[name] admitted to [facility] for Cdiff on 2/2. Underwent decompressive cecostomy 2/3, Then was brought to [facility] where subsequently his wound dehisced and he underwent an exploratory laparotomy, application of wound vac, liver biopsy.  He underwent 2 more ex lap's in order to fully close the fascia.  Hospital course complicated by ARDS, PNA, septic shock, renal failure. To [facility] rehab, aspiration event 3/29.   Subjective: Was called by rehab to come to evaluate the patient secondary to increased nausea and bilious vomiting over the last few days.  The patient denies abdominal pain.  He has significant anorexia and is eating very minimally.  A few bites at a time and very little liquids.  He has been minimally involved in rehab due to feeling ill.  He is on nutritional supplements, but is getting very little.  He was diagnosed with a pneumonia and started on antibiotics on 04/21/12.  Wife notes he is very discouraged at his progress.  Objective: Vital signs in last 24 hours: Temp:  [98.1 F (36.7 C)-98.5 F (36.9 C)] 98.5 F (36.9 C) (04/01 0458) Pulse Rate:  [100-108] 100 (04/01 0458) Resp:  [16-18] 16 (04/01 0458) BP: (92-100)/(65-66) 100/65 mmHg (04/01 0458) SpO2:  [98 %-100 %] 100 % (04/01 0458) Last BM Date: 04/24/12  Intake/Output from previous day: 03/31 0701 - 04/01 0700 In: 120 [P.O.:120] Out: 1200 [Urine:1050; Stool:150] Intake/Output this shift:    PE: Gen:  Alert, NAD, pleasant Abd: Soft, NT/ND, few BS, no HSM, dressing in place   Lab Results:   Recent Labs  04/23/12 0715  WBC 13.4*  HGB 7.3*  HCT 21.9*  PLT 341   BMET  Recent Labs  04/23/12 0715  NA 137  K 3.9  CL 108  CO2 19  GLUCOSE 95  BUN 31*  CREATININE 1.67*  CALCIUM 8.1*   PT/INR No results found for this basename: LABPROT, INR,  in the last 72 hours CMP     Component Value Date/Time   NA 137 04/23/2012 0715   K 3.9 04/23/2012 0715   CL 108 04/23/2012 0715   CO2 19 04/23/2012 0715   GLUCOSE 95  04/23/2012 0715   BUN 31* 04/23/2012 0715   CREATININE 1.67* 04/23/2012 0715   CALCIUM 8.1* 04/23/2012 0715   PROT 5.6* 04/21/2012 0840   ALBUMIN 1.6* 04/21/2012 0840   AST 19 04/21/2012 0840   ALT 24 04/21/2012 0840   ALKPHOS 297* 04/21/2012 0840   BILITOT 1.5* 04/21/2012 0840   GFRNONAA 45* 04/23/2012 0715   GFRAA 52* 04/23/2012 0715   Lipase     Component Value Date/Time   LIPASE 34 04/02/2012 0424       Studies/Results: Dg Chest Port 1 View  04/24/2012  *RADIOLOGY REPORT*  Clinical Data: Left pleural effusion  PORTABLE CHEST - 1 VIEW  Comparison: April 21, 2012.  Findings: No change is seen involving moderate left pleural effusion.  Continued right basilar opacity is noted concerning for pneumonia or subsegmental atelectasis.  No pneumothorax is noted. Cardiomediastinal silhouette is within normal limits.  IMPRESSION: Moderate left pleural effusion.  Mild right basilar opacity concerning for pneumonia or subsegmental atelectasis.   Original Report Authenticated By: Marijo Conception.,  M.D.    Dg Abd Portable 1v  04/23/2012  *RADIOLOGY REPORT*  Clinical Data: 52 year old male with nausea.  Possible ileus.  PORTABLE ABDOMEN - 1 VIEW  Comparison: 04/21/2012 and earlier.  Findings: Supine portable AP view at 1624 hours.  Increased gastric and small bowel gas.  Pattern  is nonobstructed.  No dilated loops are identified.  Stable abdominal and pelvic visceral contours. Stable visualized osseous structures.  IMPRESSION: Increased gastric and small bowel gas in a nonobstructed bowel gas pattern.   Original Report Authenticated By: Roselyn Reef, M.D.     Anti-infectives: Anti-infectives   Start     Dose/Rate Route Frequency Ordered Stop   04/21/12 1400  aztreonam (AZACTAM) 1 g in dextrose 5 % 50 mL IVPB     1 g 100 mL/hr over 30 Minutes Intravenous 3 times per day 04/21/12 0927 05/01/12 1359   04/21/12 1000  vancomycin (VANCOCIN) IVPB 1000 mg/200 mL premix  Status:  Discontinued     1,000 mg 200 mL/hr  over 60 Minutes Intravenous Every 12 hours 04/21/12 0906 04/22/12 1102   04/21/12 1000  aztreonam (AZACTAM) 1 g in dextrose 5 % 50 mL IVPB     1 g 100 mL/hr over 30 Minutes Intravenous  Once 04/21/12 0927 04/21/12 1109   04/21/12 1000  vancomycin (VANCOCIN) IVPB 1000 mg/200 mL premix  Status:  Discontinued     1,000 mg 200 mL/hr over 60 Minutes Intravenous Every 12 hours 04/22/12 1102 04/23/12 1302   04/21/12 0930  aztreonam (AZACTAM) injection 1 g  Status:  Discontinued     1 g Intravenous 3 times per day 04/21/12 0901 04/21/12 0925   04/18/12 0800  fluconazole (DIFLUCAN) tablet 100 mg  Status:  Discontinued     100 mg Oral Daily 04/17/12 1634 04/20/12 1320       Assessment/Plan Toxic megacolon CDiff colitis s/p decompressive cecostomy and ileostomy (Dr. Geroge Baseman), wound dehiscence and 3 Ex laps in the OR for closure (Dr. Excell Seltzer) 1.  The patient seems to be taking a step backwards in his recovery.  He is minimally participating in rehab.  He is not eating much and he is getting weaker.  He was recently diagnosed with a pneumonia on Friday/saturday. 2.  NPO, NG tube, IVF, pain control, antiemetics 3.  He is severely malnourished and may need some type of enteral feeding vs TPN.   4.  Follow labs 5.  Ordered CT with oral contrast to see if he has a bowel obstruction which may be causing his nausea/vomiting.   6.  Will f/u today or tomorrow for CT results    LOS: 7 days    DORT, MEGAN 04/24/2012, 1:31 PM Pager: [phone]   Nasogastric tube placed.  Not functioning properly.  I was able to flush and connect to  Suction to get it to work well.  Bilious fluid returned.  I removed bolsters & retention sutures given the fact it has been a month since surgery and wound mostly closed.    I sharply debrided the abdominal wound with scissors to remove necrotic SQ fat & fascia material.  Exposed Novafil stitch tails left in place.  Repacked with gauze.  Patient tolerated the procedure  well.  Effluent from ileostomy thick and tacky.  ?melena.  May explain hemoglobin fall.  Wafer seems  Lifted off of the ileostomy.   Ostomy centrally pink.  No return of blood from nasogastric tube so far.   Continue twice a day PPI.  If persists or positive blood per ileostomy, consider gastroenterology evaluation with EGD.  Rising creatinine concern.  With ileostomy, and respiratory hydration.  We will give IV fluids and re-evaluate.  Have wound WOCN ostomy nurses reevaluate in rehab to make sure pouching is appropriate and wound care appropriate.  Given the failure to thrive,  agree with CT scan to rule out intra-abdominal abscess/process/obstruction.  Also a good idea to rule out infectious source elsewhere.  Last infection was possible urinary tract infection two weeks ago.  Moderately resistant Enterobacter but only 30K.    Severe malnutrition with albumin <2 a major stumbling block on his  road to recovery.  Hopefully that can be turned around.  Supplemental shakes as tolerated.  Calorie counts.  Consider TNA if persistent obstruction.

## 2012-04-24 NOTE — Consult Note (Signed)
Name: Connor Small   MRN: [id number]  DOB: 1960-08-31   ADMISSION DATE: 04/17/2012  CONSULTATION DATE: 3/29  REFERRING MD : Dr Cato Mulligan    CHIEF COMPLAINT: Aspiration PNA?   BRIEF PATIENT DESCRIPTION:  1 YOM admitted to [facility] for Cdiff on 2/2. Underwent decompressive cecostomy 2/3, Hospital course complicated by ARDS, PNA, septic shock, renal failure, wound dehiscence and required multiple OR visits. To [facility] rehab, aspiration event 3/29.    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 Overlying bowel gas and patientclinical status. 2. No explanation for elevated liver function tests.3. Similar to slight increase in size of a perisplenic fluidcollection.4. Right pleural effusion  2/20- reduction pressors, improved alertness  2/21- bleeding overnight from JP's, argatroban turned off, lower pressor needs  2/22- off pressors (yeah), on RA (yeah)  2/23- neg from JPs again, inr reducing  2/24- concern dehiscence , some improved renal fxn with increased volume  2/24- possible dye change in output after dye 1 hr  2/25-relative brady  2/25 cysto neg injury  3/28 Called back to see on rehab for ?  Asp  4/1- continued nausea  CULTURES:  3/29 BC x 2>>>    ANTIBIOTICS:  S/p full tratemtn 3 weeks cdiff  Imipenem allergy noted   Aztreonam 3/29>>>  vanc 3/29>>> 3/31   Subjective:  Wife reports pt nauseated, vomited all night, did not rest.    Filed Vitals:   04/23/12 0300 04/23/12 0557 04/23/12 1548 04/24/12 0458  BP: 97/56 96/59 92/66  100/65  Pulse: 97 94 108 100  Temp:  97.5 F (36.4 C) 98.1 F (36.7 C) 98.5 F (36.9 C)  TempSrc:  Oral Oral Oral  Resp:  18 18 16   Height:      Weight:      SpO2:  98% 98% 100%    Recent Labs Lab 04/19/12 0620 04/21/12 0840 04/23/12 0715  HGB 7.4* 7.8* 7.3*  HCT 24.8* 24.7* 21.9*  WBC 10.3 14.7* 13.4*  PLT 317 350 341    Recent Labs Lab 04/18/12 0525 04/19/12 0620 04/20/12 0555 04/21/12 0840 04/23/12 0715  NA 157* 156* 147* 148* 137  K 3.4* 3.1* 3.9 3.8 3.9  CL 126* 124* 115* 117* 108  CO2 21 23 22 20 19   GLUCOSE 108* 101* 332* 113* 95  BUN 36* 34* 31* 32* 31*  CREATININE 1.25 1.36* 1.43* 1.61* 1.67*  CALCIUM 8.4 8.6 8.2* 8.5 8.1*   CXR 4/1 - Moderate left pleural effusion. Mild right basilar opacity concerning for pneumonia or subsegmental atelectasis - improved  ASSESSMENT / PLAN:   PULMONARY  A:  Lt pleural effusion.  R Linear Airspace Disease - 3/29  c/w asp vs HCAP   P:  -IS  -Increase activity as able  -abx as above -aspiration precautions / upright positioning -PCXR is improved, max IS -see ID -no role thora at this stage  INFECTIOUS  A:  C diff colitis, resolved clinically  A New asp vs HCAP 3/29  - Remains culture neg also ARF, dc vanc   P: -Continue aztreonam, will change to oral abx in am an dlimit course, consider 5 days  ATN / ARF/ Hypovolemia   P: -Chem PRN -Follow ostomy output closely, allow for positive balance to even -urine output improved follow up chem in am   Nausea  - KUB with non-obstructive bowel gas pattern, r/o ileus, at risk SBO (has output from ostomy)  P: -held  ASA (continues d/t embolic cva) with nausea, will plan restart in 48 hrs, remains in SR -Limit narcotics as able  -compazine scheduled -  May help food tolerance? -wife doesn't want Carafate ("makes him nauseated") -would place NGt and suction, kub with distended stomach and bowels, wife described bilous vomit -may need repeat CT, abdomen soft -NPO, re eval swallow fxn post ngt when [place] better -AVOID TPN for now  [name], NP-C [place] [facility] Pgr: [phone] or [phone]  I have fully examined this patient and agree with above findings.    And edited in full  Mcarthur Rossetti. Tyson Alias, MD, FACP Pgr: [phone] [facility]

## 2012-04-25 ENCOUNTER — Ambulatory Visit (HOSPITAL_COMMUNITY): Payer: BC Managed Care – PPO | Admitting: Physical Therapy

## 2012-04-25 ENCOUNTER — Encounter (HOSPITAL_COMMUNITY): Payer: Self-pay | Admitting: Neurology

## 2012-04-25 ENCOUNTER — Inpatient Hospital Stay (HOSPITAL_COMMUNITY): Payer: BC Managed Care – PPO

## 2012-04-25 ENCOUNTER — Inpatient Hospital Stay (HOSPITAL_COMMUNITY)
Admission: AD | Admit: 2012-04-25 | Discharge: 2012-05-09 | DRG: 540 | Disposition: A | Discharge status: Rehabilitation Facility | Payer: BC Managed Care – PPO | Source: Other Acute Inpatient Hospital | Attending: Internal Medicine | Admitting: Internal Medicine

## 2012-04-25 ENCOUNTER — Inpatient Hospital Stay (HOSPITAL_COMMUNITY): Payer: BC Managed Care – PPO | Admitting: Speech Pathology

## 2012-04-25 ENCOUNTER — Encounter (HOSPITAL_COMMUNITY): Payer: BC Managed Care – PPO | Admitting: Occupational Therapy

## 2012-04-25 DIAGNOSIS — Z880 Allergy status to penicillin: Secondary | ICD-10-CM

## 2012-04-25 DIAGNOSIS — R7309 Other abnormal glucose: Secondary | ICD-10-CM | POA: Diagnosis present

## 2012-04-25 DIAGNOSIS — N182 Chronic kidney disease, stage 2 (mild): Secondary | ICD-10-CM | POA: Diagnosis present

## 2012-04-25 DIAGNOSIS — I4891 Unspecified atrial fibrillation: Secondary | ICD-10-CM | POA: Diagnosis present

## 2012-04-25 DIAGNOSIS — G7281 Critical illness myopathy: Secondary | ICD-10-CM | POA: Diagnosis present

## 2012-04-25 DIAGNOSIS — I639 Cerebral infarction, unspecified: Secondary | ICD-10-CM

## 2012-04-25 DIAGNOSIS — X58XXXA Exposure to other specified factors, initial encounter: Secondary | ICD-10-CM | POA: Diagnosis present

## 2012-04-25 DIAGNOSIS — E860 Dehydration: Secondary | ICD-10-CM | POA: Diagnosis present

## 2012-04-25 DIAGNOSIS — D72829 Elevated white blood cell count, unspecified: Secondary | ICD-10-CM | POA: Diagnosis present

## 2012-04-25 DIAGNOSIS — S43006A Unspecified dislocation of unspecified shoulder joint, initial encounter: Secondary | ICD-10-CM | POA: Diagnosis present

## 2012-04-25 DIAGNOSIS — S36029D Unspecified contusion of spleen, subsequent encounter: Secondary | ICD-10-CM

## 2012-04-25 DIAGNOSIS — R188 Other ascites: Secondary | ICD-10-CM

## 2012-04-25 DIAGNOSIS — R627 Adult failure to thrive: Secondary | ICD-10-CM | POA: Diagnosis present

## 2012-04-25 DIAGNOSIS — J690 Pneumonitis due to inhalation of food and vomit: Principal | ICD-10-CM | POA: Diagnosis present

## 2012-04-25 DIAGNOSIS — D638 Anemia in other chronic diseases classified elsewhere: Secondary | ICD-10-CM | POA: Diagnosis present

## 2012-04-25 DIAGNOSIS — N179 Acute kidney failure, unspecified: Secondary | ICD-10-CM | POA: Diagnosis present

## 2012-04-25 DIAGNOSIS — R29898 Other symptoms and signs involving the musculoskeletal system: Secondary | ICD-10-CM | POA: Diagnosis present

## 2012-04-25 DIAGNOSIS — J189 Pneumonia, unspecified organism: Secondary | ICD-10-CM | POA: Diagnosis present

## 2012-04-25 DIAGNOSIS — Z932 Ileostomy status: Secondary | ICD-10-CM

## 2012-04-25 DIAGNOSIS — IMO0002 Reserved for concepts with insufficient information to code with codable children: Secondary | ICD-10-CM

## 2012-04-25 DIAGNOSIS — R319 Hematuria, unspecified: Secondary | ICD-10-CM

## 2012-04-25 DIAGNOSIS — E43 Unspecified severe protein-calorie malnutrition: Secondary | ICD-10-CM | POA: Diagnosis present

## 2012-04-25 DIAGNOSIS — D735 Infarction of spleen: Secondary | ICD-10-CM | POA: Diagnosis present

## 2012-04-25 DIAGNOSIS — E876 Hypokalemia: Secondary | ICD-10-CM | POA: Diagnosis present

## 2012-04-25 DIAGNOSIS — D62 Acute posthemorrhagic anemia: Secondary | ICD-10-CM | POA: Diagnosis present

## 2012-04-25 DIAGNOSIS — I6340 Cerebral infarction due to embolism of unspecified cerebral artery: Secondary | ICD-10-CM | POA: Diagnosis present

## 2012-04-25 DIAGNOSIS — I498 Other specified cardiac arrhythmias: Secondary | ICD-10-CM | POA: Diagnosis present

## 2012-04-25 DIAGNOSIS — Z9049 Acquired absence of other specified parts of digestive tract: Secondary | ICD-10-CM

## 2012-04-25 DIAGNOSIS — G8250 Quadriplegia, unspecified: Secondary | ICD-10-CM | POA: Diagnosis present

## 2012-04-25 DIAGNOSIS — J90 Pleural effusion, not elsewhere classified: Secondary | ICD-10-CM | POA: Diagnosis present

## 2012-04-25 DIAGNOSIS — G589 Mononeuropathy, unspecified: Secondary | ICD-10-CM | POA: Diagnosis present

## 2012-04-25 DIAGNOSIS — E870 Hyperosmolality and hypernatremia: Secondary | ICD-10-CM

## 2012-04-25 DIAGNOSIS — R000 Tachycardia, unspecified: Secondary | ICD-10-CM

## 2012-04-25 DIAGNOSIS — S36029A Unspecified contusion of spleen, initial encounter: Secondary | ICD-10-CM

## 2012-04-25 DIAGNOSIS — R5381 Other malaise: Secondary | ICD-10-CM | POA: Diagnosis present

## 2012-04-25 DIAGNOSIS — A0472 Enterocolitis due to Clostridium difficile, not specified as recurrent: Secondary | ICD-10-CM

## 2012-04-25 DIAGNOSIS — K5190 Ulcerative colitis, unspecified, without complications: Secondary | ICD-10-CM

## 2012-04-25 DIAGNOSIS — D689 Coagulation defect, unspecified: Secondary | ICD-10-CM

## 2012-04-25 DIAGNOSIS — I480 Paroxysmal atrial fibrillation: Secondary | ICD-10-CM | POA: Diagnosis present

## 2012-04-25 HISTORY — DX: Other ascites: R18.8

## 2012-04-25 LAB — CBC WITH DIFFERENTIAL/PLATELET
Basophils Relative: 1 % (ref 0–1)
Eosinophils Relative: 0 % (ref 0–5)
HCT: 23.9 % — ABNORMAL LOW (ref 39.0–52.0)
Hemoglobin: 8 g/dL — ABNORMAL LOW (ref 13.0–17.0)
Lymphs Abs: 1.5 10*3/uL (ref 0.7–4.0)
MCV: 91.9 fL (ref 78.0–100.0)
Monocytes Relative: 7 % (ref 3–12)
Neutro Abs: 13.8 10*3/uL — ABNORMAL HIGH (ref 1.7–7.7)
RBC: 2.6 MIL/uL — ABNORMAL LOW (ref 4.22–5.81)
WBC: 16.7 10*3/uL — ABNORMAL HIGH (ref 4.0–10.5)

## 2012-04-25 LAB — GLUCOSE, CAPILLARY
Glucose-Capillary: 73 mg/dL (ref 70–99)
Glucose-Capillary: 70 mg/dL (ref 70–99)
Glucose-Capillary: 74 mg/dL (ref 70–99)

## 2012-04-25 LAB — COMPREHENSIVE METABOLIC PANEL
AST: 37 U/L (ref 0–37)
Albumin: 1.4 g/dL — ABNORMAL LOW (ref 3.5–5.2)
Calcium: 8.1 mg/dL — ABNORMAL LOW (ref 8.4–10.5)
Creatinine, Ser: 1.54 mg/dL — ABNORMAL HIGH (ref 0.50–1.35)
Sodium: 136 mEq/L (ref 135–145)

## 2012-04-25 MED ORDER — HYDROCODONE-ACETAMINOPHEN 5-325 MG PO TABS: 1.0000 | ORAL_TABLET | ORAL | Status: DC | PRN

## 2012-04-25 MED ORDER — INSULIN ASPART 100 UNIT/ML ~~LOC~~ SOLN: 0.0000 [IU] | Freq: Every day | SUBCUTANEOUS | Status: DC

## 2012-04-25 MED ORDER — LACTATED RINGERS IV BOLUS (SEPSIS): 1000.0000 mL | Freq: Three times a day (TID) | INTRAVENOUS | Status: DC | PRN

## 2012-04-25 MED ORDER — LEVALBUTEROL HCL 0.63 MG/3ML IN NEBU
0.6300 mg | INHALATION_SOLUTION | Freq: Four times a day (QID) | RESPIRATORY_TRACT | Status: DC | PRN
  Filled 2012-04-25: qty 3

## 2012-04-25 MED ORDER — PANTOPRAZOLE SODIUM 40 MG PO PACK
40.0000 mg | PACK | Freq: Every day | ORAL | Status: DC
  Filled 2012-04-25: qty 20

## 2012-04-25 MED ORDER — ENOXAPARIN SODIUM 40 MG/0.4ML ~~LOC~~ SOLN
40.0000 mg | SUBCUTANEOUS | Status: DC
  Filled 2012-04-25: qty 0.4

## 2012-04-25 MED ORDER — SODIUM CHLORIDE 0.9 % IV SOLN
INTRAVENOUS | Status: DC
  Administered 2012-04-25: 75 mL/h via INTRAVENOUS
  Administered 2012-04-25: 1000 mL via INTRAVENOUS
  Administered 2012-04-26: 500 mL via INTRAVENOUS
  Administered 2012-04-26: 1000 mL via INTRAVENOUS
  Administered 2012-04-28 (×2): via INTRAVENOUS

## 2012-04-25 MED ORDER — VITAL AF 1.2 CAL PO LIQD
1000.0000 mL | ORAL | Status: DC
  Filled 2012-04-25 (×2): qty 1000

## 2012-04-25 MED ORDER — MENTHOL 3 MG MT LOZG
1.0000 | LOZENGE | OROMUCOSAL | Status: DC | PRN
  Filled 2012-04-25: qty 9

## 2012-04-25 MED ORDER — AZTREONAM 1 G IJ SOLR
1.0000 g | Freq: Three times a day (TID) | INTRAMUSCULAR | Status: DC
  Filled 2012-04-25 (×2): qty 1

## 2012-04-25 MED ORDER — ACETAMINOPHEN 650 MG RE SUPP
650.0000 mg | RECTAL | Status: DC | PRN
  Administered 2012-04-28 – 2012-04-29 (×2): 650 mg via RECTAL
  Filled 2012-04-25 (×2): qty 1

## 2012-04-25 MED ORDER — FERROUS SULFATE 325 (65 FE) MG PO TABS: 325.0000 mg | ORAL_TABLET | Freq: Two times a day (BID) | ORAL | Status: DC

## 2012-04-25 MED ORDER — PANTOPRAZOLE SODIUM 40 MG PO PACK
40.0000 mg | PACK | Freq: Every day | ORAL | Status: DC
  Administered 2012-04-26 – 2012-05-09 (×13): 40 mg
  Filled 2012-04-25 (×17): qty 20

## 2012-04-25 MED ORDER — MAGIC MOUTHWASH
15.0000 mL | Freq: Four times a day (QID) | ORAL | Status: DC | PRN
  Administered 2012-05-05: 5 mL via ORAL
  Filled 2012-04-25: qty 5
  Filled 2012-04-25: qty 15

## 2012-04-25 MED ORDER — FERROUS SULFATE 300 (60 FE) MG/5ML PO SYRP
300.0000 mg | ORAL_SOLUTION | Freq: Two times a day (BID) | ORAL | Status: DC
  Administered 2012-04-25 – 2012-04-27 (×4): 300 mg
  Filled 2012-04-25 (×6): qty 5

## 2012-04-25 MED ORDER — BISMUTH SUBSALICYLATE 262 MG/15ML PO SUSP
30.0000 mL | Freq: Three times a day (TID) | ORAL | Status: DC | PRN
  Filled 2012-04-25: qty 236

## 2012-04-25 MED ORDER — CHLORHEXIDINE GLUCONATE 0.12 % MT SOLN
15.0000 mL | Freq: Two times a day (BID) | OROMUCOSAL | Status: DC
  Administered 2012-04-25 – 2012-05-09 (×26): 15 mL via OROMUCOSAL
  Filled 2012-04-25 (×25): qty 15

## 2012-04-25 MED ORDER — INSULIN ASPART 100 UNIT/ML ~~LOC~~ SOLN
0.0000 [IU] | Freq: Three times a day (TID) | SUBCUTANEOUS | Status: DC
  Administered 2012-04-27 – 2012-05-04 (×2): 1 [IU] via SUBCUTANEOUS

## 2012-04-25 MED ORDER — LOPERAMIDE HCL 1 MG/5ML PO LIQD
2.0000 mg | Freq: Every day | ORAL | Status: DC
  Filled 2012-04-25: qty 10

## 2012-04-25 MED ORDER — MORPHINE SULFATE 2 MG/ML IJ SOLN: 4.0000 mg | INTRAMUSCULAR | Status: DC | PRN

## 2012-04-25 MED ORDER — FERROUS SULFATE 300 (60 FE) MG/5ML PO SYRP
300.0000 mg | ORAL_SOLUTION | Freq: Two times a day (BID) | ORAL | Status: DC
  Filled 2012-04-25 (×2): qty 5

## 2012-04-25 MED ORDER — LIDOCAINE 5 % EX PTCH
1.0000 | MEDICATED_PATCH | CUTANEOUS | Status: DC
  Administered 2012-04-26 – 2012-05-09 (×14): 1 via TRANSDERMAL
  Filled 2012-04-25 (×14): qty 1

## 2012-04-25 MED ORDER — DIPHENHYDRAMINE HCL 12.5 MG/5ML PO ELIX
12.5000 mg | ORAL_SOLUTION | Freq: Four times a day (QID) | ORAL | Status: DC | PRN
  Filled 2012-04-25: qty 10

## 2012-04-25 MED ORDER — LOPERAMIDE HCL 2 MG PO CAPS: 2.0000 mg | ORAL_CAPSULE | Freq: Three times a day (TID) | ORAL | Status: DC | PRN

## 2012-04-25 MED ORDER — BLISTEX EX OINT
TOPICAL_OINTMENT | Freq: Two times a day (BID) | CUTANEOUS | Status: DC
  Administered 2012-04-25 – 2012-04-26 (×2): 1 via TOPICAL
  Administered 2012-04-26 – 2012-04-29 (×7): via TOPICAL
  Administered 2012-04-30: 1 via TOPICAL
  Administered 2012-04-30 – 2012-05-09 (×17): via TOPICAL
  Filled 2012-04-25: qty 10

## 2012-04-25 MED ORDER — LIDOCAINE HCL 2 % EX GEL
CUTANEOUS | Status: DC | PRN
  Filled 2012-04-25: qty 5

## 2012-04-25 MED ORDER — SACCHAROMYCES BOULARDII 250 MG PO CAPS
250.0000 mg | ORAL_CAPSULE | Freq: Two times a day (BID) | ORAL | Status: DC
  Administered 2012-04-25 – 2012-05-09 (×25): 250 mg via ORAL
  Filled 2012-04-25 (×30): qty 1

## 2012-04-25 MED ORDER — PANTOPRAZOLE SODIUM 40 MG PO TBEC: 40.0000 mg | DELAYED_RELEASE_TABLET | Freq: Every day | ORAL | Status: DC

## 2012-04-25 MED ORDER — GUAIFENESIN-DM 100-10 MG/5ML PO SYRP
5.0000 mL | ORAL_SOLUTION | Freq: Four times a day (QID) | ORAL | Status: DC | PRN
  Filled 2012-04-25: qty 10

## 2012-04-25 MED ORDER — ALUM & MAG HYDROXIDE-SIMETH 200-200-20 MG/5ML PO SUSP
15.0000 mL | ORAL | Status: DC | PRN
  Administered 2012-05-03: 15 mL via ORAL
  Filled 2012-04-25: qty 30

## 2012-04-25 MED ORDER — JEVITY 1.2 CAL PO LIQD
1000.0000 mL | ORAL | Status: DC
  Filled 2012-04-25 (×2): qty 1000

## 2012-04-25 MED ORDER — MORPHINE SULFATE 2 MG/ML IJ SOLN
2.0000 mg | INTRAMUSCULAR | Status: DC | PRN
  Administered 2012-04-26 – 2012-04-28 (×4): 2 mg via INTRAVENOUS
  Filled 2012-04-25 (×4): qty 1

## 2012-04-25 MED ORDER — LOPERAMIDE HCL 1 MG/5ML PO LIQD
2.0000 mg | Freq: Every day | ORAL | Status: DC
  Administered 2012-04-27: 2 mg via ORAL
  Filled 2012-04-25 (×5): qty 10

## 2012-04-25 MED ORDER — PROCHLORPERAZINE EDISYLATE 5 MG/ML IJ SOLN
10.0000 mg | Freq: Four times a day (QID) | INTRAMUSCULAR | Status: DC
  Administered 2012-04-25 – 2012-05-03 (×27): 10 mg via INTRAVENOUS
  Filled 2012-04-25 (×41): qty 2

## 2012-04-25 MED ORDER — PHENOL 1.4 % MT LIQD
2.0000 | OROMUCOSAL | Status: DC | PRN
  Filled 2012-04-25: qty 177

## 2012-04-25 MED ORDER — LOPERAMIDE HCL 2 MG PO CAPS: 2.0000 mg | ORAL_CAPSULE | Freq: Every day | ORAL | Status: DC

## 2012-04-25 MED ORDER — MORPHINE SULFATE 2 MG/ML IJ SOLN
2.0000 mg | INTRAMUSCULAR | Status: DC | PRN
  Administered 2012-04-25: 2 mg via INTRAVENOUS
  Filled 2012-04-25: qty 1

## 2012-04-25 MED ORDER — SIMETHICONE 40 MG/0.6ML PO SUSP
80.0000 mg | Freq: Four times a day (QID) | ORAL | Status: DC | PRN
  Filled 2012-04-25: qty 1.2

## 2012-04-25 MED ORDER — VITAL AF 1.2 CAL PO LIQD
1000.0000 mL | ORAL | Status: DC
  Administered 2012-04-25: 1000 mL
  Filled 2012-04-25: qty 1000

## 2012-04-25 MED ORDER — SODIUM CHLORIDE 0.9 % IV SOLN
INTRAVENOUS | Status: DC
  Administered 2012-04-25: 14:00:00 via INTRAVENOUS

## 2012-04-25 NOTE — Progress Notes (Signed)
Patient transferred to 2600.  Unable to alter previous note due to transfer.    Addendum to Plan:   -will d/c abx as he has completed 5 days total -continue aggressive PT  -ongoing discussion regarding potential use of anti-depressant (patient wants to hold off for now).   -spiritual care  -begin trickle feeds per surgery, calorie count etc -initial review of fluid from paracentesis reassuring -follow cultures / fluid results  PCCM will continue to follow along with you.     Noe Gens, NP-C [facility] Pgr: [phone] or [phone]

## 2012-04-25 NOTE — Progress Notes (Signed)
Pt transferred to 2612, pt noted to have a collection of fluid in abdomen, sent for paracentesis today, NG clamped, MD ordered transfer to 2612 today, full report called to Vicente Males, [place]

## 2012-04-25 NOTE — Progress Notes (Signed)
Patient ID: Connor Small, male   DOB: Jul 09, 1960, 52 y.o.   MRN: [id number]    Subjective: Pt still with nausea and vomiting, no having severe pain but does have increase in pain with palp, feels poor overall  Objective: Vital signs in last 24 hours: Temp:  [98.2 F (36.8 C)-98.4 F (36.9 C)] 98.2 F (36.8 C) (04/02 0500) Pulse Rate:  [100-109] 109 (04/02 0500) Resp:  [16-18] 16 (04/02 0500) BP: (92-102)/(62-66) 102/66 mmHg (04/02 0500) SpO2:  [97 %-100 %] 97 % (04/02 0500) Last BM Date: 04/24/12  Intake/Output from previous day: 04/01 0701 - 04/02 0700 In: 0  Out: 850 [Urine:350; Stool:500] Intake/Output this shift:    PE: Abd: tender diffusely but no peritonitis, distended, only faint bowel sounds Ileostomy healthy, WOC in wound and happy with pouch and wafer, midline wound healthy appearing with good granulation  Lab Results:   Recent Labs  04/24/12 1441 04/25/12 0005  WBC 15.6* 16.7*  HGB 8.2* 8.0*  HCT 24.4* 23.9*  PLT 384 403*   BMET  Recent Labs  04/24/12 1441 04/25/12 0005  NA 137 136  K 3.9 3.6  CL 106 107  CO2 19 20  GLUCOSE 77 77  BUN 28* 25*  CREATININE 1.61* 1.54*  CALCIUM 8.1* 8.1*   PT/INR No results found for this basename: LABPROT, INR,  in the last 72 hours CMP     Component Value Date/Time   NA 136 04/25/2012 0005   K 3.6 04/25/2012 0005   CL 107 04/25/2012 0005   CO2 20 04/25/2012 0005   GLUCOSE 77 04/25/2012 0005   BUN 25* 04/25/2012 0005   CREATININE 1.54* 04/25/2012 0005   CALCIUM 8.1* 04/25/2012 0005   PROT 5.0* 04/25/2012 0005   ALBUMIN 1.4* 04/25/2012 0005   AST 37 04/25/2012 0005   ALT 28 04/25/2012 0005   ALKPHOS 294* 04/25/2012 0005   BILITOT 1.2 04/25/2012 0005   GFRNONAA 50* 04/25/2012 0005   GFRAA 57* 04/25/2012 0005   Lipase     Component Value Date/Time   LIPASE 34 04/02/2012 0424       Studies/Results: Ct Abdomen Pelvis Wo Contrast  04/24/2012  *RADIOLOGY REPORT*  Clinical Data: Abdominal pain and distension.  CT ABDOMEN AND  PELVIS WITHOUT CONTRAST  Technique:  Multidetector CT imaging of the abdomen and pelvis was performed following the standard protocol without intravenous contrast.  Comparison: 03/11/2012  Findings: Bilateral pleural effusions, greater on the left, with bilateral infiltration or atelectasis in the lung bases.  This is slightly progressive since previous study.  Diffuse upper abdominal free fluid.  This is progressing since previous study.  Hounsfield unit measurements are somewhat increased, suggesting possible hemorrhage versus complex fluid. Fluid extends down the pericolic gutters and anteriorly in the pelvis. There appears to be loculated fluid deforming the spleen which may represent subcapsular fluid or hematoma.  The liver, gallbladder pancreas, adrenal glands, and retroperitoneal lymph nodes are unremarkable.  Calcification of the aorta without aneurysm.  Flattened inferior vena cava may represent hypovolemia. Tiny punctate stone in the lower pole of the right kidney without obstruction.  No hydronephrosis in either kidney.  No free air in the abdomen.  Postoperative changes with colectomy and right lower quadrant ileostomy.  Small bowel are not distended and contrast material flows to the ileostomy suggesting no evidence of obstruction.  There is an NG tube in place in the stomach is decompressed.  Midline incisional defect in the soft tissues.  Pelvis:  Extensive ascites extending  from above.  Bladder wall is not thickened.  Prominent sized prostate gland, measuring about 4.7 x 3.3 cm.  No focal inflammatory no significant pelvic lymphadenopathy.  Mild degenerative changes in the spine and hips.  IMPRESSION: Increasing free fluid in the abdomen and pelvis.  Hounsfield unit measurements are somewhat increased, suggesting possible hemorrhage or complex fluid.  Loculated fluid in the left upper quadrant deforms the spleen and could represent a subcapsular fluid collection or hematoma.  Increasing bilateral  pleural effusions with basilar atelectasis.  No evidence of obstruction.   Original Report Authenticated By: Burman Nieves, M.D.    Dg Abd 1 View  04/24/2012  *RADIOLOGY REPORT*  Clinical Data: Abdominal pain and distension, NG tube placement  ABDOMEN - 1 VIEW  Comparison: 04/23/2012  Findings: NG tube projects over the pyloric region of the stomach. There are no abnormally dilated loops of bowel.  IMPRESSION: Nonobstructive gas pattern is similar to prior study   Original Report Authenticated By: Esperanza Heir, M.D.    Dg Chest Port 1 View  04/24/2012  *RADIOLOGY REPORT*  Clinical Data: Left pleural effusion  PORTABLE CHEST - 1 VIEW  Comparison: April 21, 2012.  Findings: No change is seen involving moderate left pleural effusion.  Continued right basilar opacity is noted concerning for pneumonia or subsegmental atelectasis.  No pneumothorax is noted. Cardiomediastinal silhouette is within normal limits.  IMPRESSION: Moderate left pleural effusion.  Mild right basilar opacity concerning for pneumonia or subsegmental atelectasis.   Original Report Authenticated By: Lupita Raider.,  M.D.    Dg Abd Portable 1v  04/23/2012  *RADIOLOGY REPORT*  Clinical Data: 52 year old male with nausea.  Possible ileus.  PORTABLE ABDOMEN - 1 VIEW  Comparison: 04/21/2012 and earlier.  Findings: Supine portable AP view at 1624 hours.  Increased gastric and small bowel gas.  Pattern is nonobstructed.  No dilated loops are identified.  Stable abdominal and pelvic visceral contours. Stable visualized osseous structures.  IMPRESSION: Increased gastric and small bowel gas in a nonobstructed bowel gas pattern.   Original Report Authenticated By: Erskine Speed, M.D.     Anti-infectives: Anti-infectives   Start     Dose/Rate Route Frequency Ordered Stop   04/21/12 1400  aztreonam (AZACTAM) 1 g in dextrose 5 % 50 mL IVPB     1 g 100 mL/hr over 30 Minutes Intravenous 3 times per day 04/21/12 0927 05/01/12 1359   04/21/12 1000   vancomycin (VANCOCIN) IVPB 1000 mg/200 mL premix  Status:  Discontinued     1,000 mg 200 mL/hr over 60 Minutes Intravenous Every 12 hours 04/21/12 0906 04/22/12 1102   04/21/12 1000  aztreonam (AZACTAM) 1 g in dextrose 5 % 50 mL IVPB     1 g 100 mL/hr over 30 Minutes Intravenous  Once 04/21/12 0927 04/21/12 1109   04/21/12 1000  vancomycin (VANCOCIN) IVPB 1000 mg/200 mL premix  Status:  Discontinued     1,000 mg 200 mL/hr over 60 Minutes Intravenous Every 12 hours 04/22/12 1102 04/23/12 1302   04/21/12 0930  aztreonam (AZACTAM) injection 1 g  Status:  Discontinued     1 g Intravenous 3 times per day 04/21/12 0901 04/21/12 0925   04/18/12 0800  fluconazole (DIFLUCAN) tablet 100 mg  Status:  Discontinued     100 mg Oral Daily 04/17/12 1634 04/20/12 1320       Assessment/Plan 1. Nausea and vomiting: CT reviewed by Dr. Michaell Cowing, diffuse intra abominal fluid but he feels this is ascites vs an  infected process, likely due to malnutrition, he will review with the radiologist, patient no longer meeting criteria for rehab as he is unable to mobilize due to nausea and vomiting, he will be transferred back to acute care, we will follow as consultants, needs aggressive caloric replacement to improve albumin,  Will follow  LOS: 8 days    Connor Small 04/25/2012

## 2012-04-25 NOTE — Progress Notes (Signed)
Pt went to radiology for paracentesis, VSS after returning to unit,  Pt has small bandaid on lower abdomen, clean, dry and intact

## 2012-04-25 NOTE — Progress Notes (Signed)
Social Work Patient ID: Joanna Hews, male   DOB: Dec 13, 1960, 52 y.o.   MRN: [id number] Met with wife to inform this worker will not cover on acute but will resume once returns to rehab.  Wife appreciatative of support and feels once medically doing  Better than he will be ready to work in therapies and returns.  Have let [employer] know transfer to acute.

## 2012-04-25 NOTE — Progress Notes (Signed)
OT Cancellation Note  Patient Details  Name: Connor Small MRN: [id number] Date of Birth: 05-Jan-1961 Today's Date: 04/25/2012  Pt missed 60 mins of OT secondary to medical status changes and pt on hold.  Plans to transfer back to acute later today.  Stefanie Hodgens OTR/L 04/25/2012, 11:14 AM

## 2012-04-25 NOTE — Consult Note (Addendum)
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  Previous peristomal maceration and mucutaneous  separation has resolved.  Peristomal assessment: intact skin surrounding Output 50cc liquid brown stool Ostomy pouching: 1pc.with barrier ring  Education provided: Wife at bedside during pouch change.  She has been present during several sessions of ostomy teaching and denies further questions at this time.  Applied barrier ring to maintain seal and one piece flexible pouch.  Pt familiar with pouching routines from previous stoma but too weak to assist.  Supplies at bedside for staff use.  CCS PA at bedside to assess stoma and abd wound.  Dr Johney Maine has left wound care orders. 2 areas of full thickness wounds to post-op sites; upper wound 9.5X2X.3cm, 90% red, 10% yellow slough, small yellow drainage, no odor. Lower abd 9X3X.3cm, 85% red, 15% yellow, small yellow drainage, no odor.  Applied dressings as ordered and bedside nurses can change daily; CCS plans to follow for abd wound assessment and plan of care.  Julien Girt MSN, RN, [place], [place], [place]

## 2012-04-25 NOTE — Progress Notes (Signed)
If persists or positive blood per ileostomy, consider gastroenterology evaluation with EGD.   Rising creatinine concern. With ileostomy, and respiratory hydration. We will give IV fluids and re-evaluate.   Loperamide QHS for ileostomy regimen  WOCN ostomy nurses help appreciated.  Consider wound vac for wound to help with granulation/healing.  CT scan shows ascites & persistent subcapsular hematoma of spleen.  Leave spleen alone.  Tap peritoneal fluid to r/o infection but most likely a reflection of his SEVERE protein calorie malnutrition.  Severe malnutrition with albumin <2 - this is the major stumbling block on his road to recovery.   Hopefully that can be turned around. Start tube feeds via NGT.  If tolerates, switch to nasogastric tube/corpak or PEG.  If not tolerating enteral route, start TNA.   Supp shakes as tolerated. Calorie counts. Consider TNA if persistent obstruction.

## 2012-04-25 NOTE — Progress Notes (Signed)
Patient ID: [name], male   DOB: 1960/10/11, 52 y.o.   MRN: [id number] 52 y.o. right-handed male with history of ulcerative colitis as well as prior diverticular perforation, who was admitted to [facility] on 02/26/2012 with low back pain, fever 102.5 and multiple episodes of watery diarrhea. Underwent flexible sigmoidoscopy 02/27/2012 per Dr.Rehman with findings of multiple pseudomembranes consistent with C. difficile colitis as well as low colorectal stricture. Underwent exploratory laparotomy,cecostomy tube placement per Dr. Dian Situ 02/27/2012. 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 Post op he developed ARDS with septic shock as well as renal failure due to AKI. On 02/06 he developed new onset Atrial fibrillation with RVR and was transferred to [facility] for treatment. Dr. Lorie Phenix consulted for input on metabolic acidosis with anasarca and non-oliguric ARF likely due to ischemic ATN in setting of hypotension and CRRT initiated. He was started on stress dose steroids and was treated with broad spectrum antibiotics. Dr. Derrell Lolling consulted for input and with close monitoring of wound. Wound evisceration treated with placement of retention sutures and wound closure on 3/9 by Dr. Johna Sheriff.  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 Neurology consulted for input global weakness and proximal RUE weakness. 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 Ct cervical spine with degenerative changes and large left pleural effusion. Dr. Kathrynn Running consulted for possible ureteral injury due to high output from JP drain and cysto with pyelogram revealed no filling defects or extravasation. Dr. Madilyn Fireman consulted for input on abnormal LFTs. 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 Dr. Pearlean Brownie recommended ASA for now and anticoagulation once thrombocytopenia/LFTs resolve and patient able to swallow safely. Dr Ophelia Charter consulted due to right shoulder pain and inferior subluxation on xray. He felt inferior position due to RUE weakness and not dislocation He developed hypotension with GIB requiring multiple units PRBC on 3/10.  Developed HCAP around 3/30-3/31.  Developed nausea and anorexia 4/1 CT abd results reviewed- +large fluid collection,?hematoma vs infectious Subjective/Complaints: Somnolent Awakens to voice Review of Systems  Unable to perform ROS: medical condition     Objective: Vital Signs: Blood pressure 102/66, pulse 109, temperature 98.2 F (36.8 C), temperature source Oral, resp. rate 16, height 5\' 10"  (1.778 m), weight 71.94 kg (158 lb 9.6 oz), SpO2 97.00%.     Results for orders placed during the hospital encounter of 04/17/12 (from the past 72 hour(s))  GLUCOSE, CAPILLARY     Status: None   Collection Time    04/22/12  4:45 PM      Result Value Range   Glucose-Capillary 82  70 - 99 mg/dL  GLUCOSE, CAPILLARY     Status: None   Collection Time    04/22/12  8:51 PM      Result Value Range   Glucose-Capillary 86  70 - 99 mg/dL  CBC     Status: Abnormal   Collection Time    04/23/12  7:15 AM      Result Value Range   WBC 13.4 (*) 4.0 - 10.5 K/uL   RBC 2.34 (*) 4.22 - 5.81 MIL/uL   Hemoglobin  7.3 (*) 13.0 - 17.0 g/dL   HCT 04.5 (*) 40.9 - 81.1 %   MCV 93.6  78.0 - 100.0 fL   MCH 31.2  26.0 - 34.0 pg   MCHC 33.3  30.0 - 36.0 g/dL   RDW 91.4 (*) 78.2 - 95.6 %   Platelets 341  150 - 400 K/uL  BASIC METABOLIC PANEL     Status: Abnormal   Collection Time    04/23/12  7:15 AM      Result Value Range    Sodium 137  135 - 145 mEq/L   Potassium 3.9  3.5 - 5.1 mEq/L   Chloride 108  96 - 112 mEq/L   CO2 19  19 - 32 mEq/L   Glucose, Bld 95  70 - 99 mg/dL   BUN 31 (*) 6 - 23 mg/dL   Creatinine, Ser 2.13 (*) 0.50 - 1.35 mg/dL   Calcium 8.1 (*) 8.4 - 10.5 mg/dL   GFR calc non Af Amer 45 (*) >90 mL/min   GFR calc Af Amer 52 (*) >90 mL/min   Comment:            The eGFR has been calculated     using the CKD EPI equation.     This calculation has not been     validated in all clinical     situations.     eGFR's persistently     <90 mL/min signify     possible Chronic Kidney Disease.  GLUCOSE, CAPILLARY     Status: None   Collection Time    04/23/12  7:15 AM      Result Value Range   Glucose-Capillary 99  70 - 99 mg/dL  GLUCOSE, CAPILLARY     Status: None   Collection Time    04/23/12 11:32 AM      Result Value Range   Glucose-Capillary 84  70 - 99 mg/dL   Comment 1 Notify RN    GLUCOSE, CAPILLARY     Status: None   Collection Time    04/23/12  4:47 PM      Result Value Range   Glucose-Capillary 84  70 - 99 mg/dL   Comment 1 Notify RN    GLUCOSE, CAPILLARY     Status: None   Collection Time    04/23/12  9:11 PM      Result Value Range   Glucose-Capillary 76  70 - 99 mg/dL  GLUCOSE, CAPILLARY     Status: None   Collection Time    04/24/12  7:22 AM      Result Value Range   Glucose-Capillary 88  70 - 99 mg/dL   Comment 1 Notify RN    GLUCOSE, CAPILLARY     Status: None   Collection Time    04/24/12 11:31 AM      Result Value Range   Glucose-Capillary 78  70 - 99 mg/dL   Comment 1 Notify RN    CBC     Status: Abnormal   Collection Time    04/24/12  2:41 PM      Result Value Range   WBC 15.6 (*) 4.0 - 10.5 K/uL   RBC 2.65 (*) 4.22 - 5.81 MIL/uL   Hemoglobin 8.2 (*) 13.0 - 17.0 g/dL   HCT 08.6 (*) 57.8 - 46.9 %   MCV 92.1  78.0 - 100.0 fL   MCH 30.9  26.0 - 34.0 pg   MCHC 33.6  30.0 - 36.0 g/dL  RDW 16.8 (*) 11.5 - 15.5 %   Platelets 384  150 - 400 K/uL   COMPREHENSIVE METABOLIC PANEL     Status: Abnormal   Collection Time    04/24/12  2:41 PM      Result Value Range   Sodium 137  135 - 145 mEq/L   Potassium 3.9  3.5 - 5.1 mEq/L   Chloride 106  96 - 112 mEq/L   CO2 19  19 - 32 mEq/L   Glucose, Bld 77  70 - 99 mg/dL   BUN 28 (*) 6 - 23 mg/dL   Creatinine, Ser 1.02 (*) 0.50 - 1.35 mg/dL   Calcium 8.1 (*) 8.4 - 10.5 mg/dL   Total Protein 5.2 (*) 6.0 - 8.3 g/dL   Albumin 1.4 (*) 3.5 - 5.2 g/dL   AST 30  0 - 37 U/L   ALT 27  0 - 53 U/L   Alkaline Phosphatase 291 (*) 39 - 117 U/L   Total Bilirubin 1.3 (*) 0.3 - 1.2 mg/dL   GFR calc non Af Amer 47 (*) >90 mL/min   GFR calc Af Amer 54 (*) >90 mL/min   Comment:            The eGFR has been calculated     using the CKD EPI equation.     This calculation has not been     validated in all clinical     situations.     eGFR's persistently     <90 mL/min signify     possible Chronic Kidney Disease.  GLUCOSE, CAPILLARY     Status: None   Collection Time    04/24/12  4:46 PM      Result Value Range   Glucose-Capillary 72  70 - 99 mg/dL   Comment 1 Notify RN    GLUCOSE, CAPILLARY     Status: None   Collection Time    04/24/12  9:43 PM      Result Value Range   Glucose-Capillary 80  70 - 99 mg/dL  COMPREHENSIVE METABOLIC PANEL     Status: Abnormal   Collection Time    04/25/12 12:05 AM      Result Value Range   Sodium 136  135 - 145 mEq/L   Potassium 3.6  3.5 - 5.1 mEq/L   Chloride 107  96 - 112 mEq/L   CO2 20  19 - 32 mEq/L   Glucose, Bld 77  70 - 99 mg/dL   BUN 25 (*) 6 - 23 mg/dL   Creatinine, Ser 7.25 (*) 0.50 - 1.35 mg/dL   Calcium 8.1 (*) 8.4 - 10.5 mg/dL   Total Protein 5.0 (*) 6.0 - 8.3 g/dL   Albumin 1.4 (*) 3.5 - 5.2 g/dL   AST 37  0 - 37 U/L   ALT 28  0 - 53 U/L   Alkaline Phosphatase 294 (*) 39 - 117 U/L   Total Bilirubin 1.2  0.3 - 1.2 mg/dL   GFR calc non Af Amer 50 (*) >90 mL/min   GFR calc Af Amer 57 (*) >90 mL/min   Comment:            The eGFR has been  calculated     using the CKD EPI equation.     This calculation has not been     validated in all clinical     situations.     eGFR's persistently     <90 mL/min signify     possible  Chronic Kidney Disease.  CBC WITH DIFFERENTIAL     Status: Abnormal   Collection Time    04/25/12 12:05 AM      Result Value Range   WBC 16.7 (*) 4.0 - 10.5 K/uL   RBC 2.60 (*) 4.22 - 5.81 MIL/uL   Hemoglobin 8.0 (*) 13.0 - 17.0 g/dL   HCT 40.9 (*) 81.1 - 91.4 %   MCV 91.9  78.0 - 100.0 fL   MCH 30.8  26.0 - 34.0 pg   MCHC 33.5  30.0 - 36.0 g/dL   RDW 78.2 (*) 95.6 - 21.3 %   Platelets 403 (*) 150 - 400 K/uL   Neutrophils Relative 83 (*) 43 - 77 %   Lymphocytes Relative 9 (*) 12 - 46 %   Monocytes Relative 7  3 - 12 %   Eosinophils Relative 0  0 - 5 %   Basophils Relative 1  0 - 1 %   Neutro Abs 13.8 (*) 1.7 - 7.7 K/uL   Lymphs Abs 1.5  0.7 - 4.0 K/uL   Monocytes Absolute 1.2 (*) 0.1 - 1.0 K/uL   Eosinophils Absolute 0.0  0.0 - 0.7 K/uL   Basophils Absolute 0.2 (*) 0.0 - 0.1 K/uL   RBC Morphology TOXIC GRANULATION     Comment: BASOPHILIC STIPPLING     MILD LEFT SHIFT (1-5% METAS, OCC MYELO, OCC BANDS)  MAGNESIUM     Status: None   Collection Time    04/25/12 12:05 AM      Result Value Range   Magnesium 1.6  1.5 - 2.5 mg/dL  PHOSPHORUS     Status: None   Collection Time    04/25/12 12:05 AM      Result Value Range   Phosphorus 3.7  2.3 - 4.6 mg/dL  GLUCOSE, CAPILLARY     Status: None   Collection Time    04/25/12  7:10 AM      Result Value Range   Glucose-Capillary 73  70 - 99 mg/dL   Comment 1 Notify RN       HEENT: thrush Cardio: RRR and tachy Resp: Diminished breath sounds B/L and unlabored GI: BS positive and distended with mod tenderness, wound looks better Extremity:  No Edema Skin:   Wound RLQ ileostomy with liquid output, ostomy site ok, Neuro: Alert/Oriented, Anxious, Cranial Nerve II-XII normal, Normal Sensory and Abnormal Motor L delt 3-/5, bi,tri, grip 4-/5, R delt 1/5,  R bi 0/5, R tri 3/5, R wrist ext 0/5, R hand intrinsics 0/5 abd, 3/5 add,2-/5 B HF, KE, 0/5 ankle DF, 3-/5 ankle eversion and PF Musc/Skel:  Other No pain with PROM of UE or LEs Gen:  anxious no acute distress 3+bilateral knee and 1+ bilateral ankle DTRs  Assessment/Plan: 1. 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  Discussed with CCS PA, rec transfer to acute to surgery service.  Informed wife and pt  FIM: FIM - Bathing Bathing Steps Patient Completed: Chest Bathing: 1: Two helpers  FIM - Upper Body Dressing/Undressing Upper body dressing/undressing: 1: Total-Patient completed less than 25% of tasks FIM - Lower Body Dressing/Undressing Lower body dressing/undressing: 1: Two helpers  FIM - Toileting Toileting: 1: Total-Patient completed zero steps, helper did all 3  FIM - Archivist Transfers: 0-Activity did not occur  FIM - Banker Devices: Sliding board;Arm rests (BEASY Board)  Bed/Chair Transfer: 1: Supine > Sit: Total A (helper does all/Pt. < 25%);1: Two helpers;1: Bed > Chair or W/C: Total A (helper does all/Pt. < 25%)  FIM - Locomotion: Wheelchair Distance: 150 in tilt in space w/c for positioning, pressure relief with head support and elevating LR to assist with BP control; full lap tray also placed for UE support Locomotion: Wheelchair: 1: Total Assistance/staff pushes wheelchair (Pt<25%) FIM - Locomotion: Ambulation Ambulation/Gait Assistance: Not tested (comment) Locomotion: Ambulation: 0: Activity did not occur  Comprehension Comprehension Mode: Auditory Comprehension: 5-Follows basic conversation/direction: With no assist  Expression Expression Mode: Verbal Expression: 3-Expresses basic 50 - 74% of the time/requires cueing 25 - 50% of the time. Needs to  repeat parts of sentences.  Social Interaction Social Interaction Mode: Not assessed Social Interaction: 4-Interacts appropriately 75 - 89% of the time - Needs redirection for appropriate language or to initiate interaction.  Problem Solving Problem Solving Mode: Not assessed Problem Solving: 3-Solves basic 50 - 74% of the time/requires cueing 25 - 49% of the time  Memory Memory Mode: Not assessed Memory: 2-Recognizes or recalls 25 - 49% of the time/requires cueing 51 - 75% of the time   Medical Problem List and Plan:  1. DVT Prophylaxis/Anticoagulation: Mechanical: Sequential compression devices, below knee Bilateral lower extremities thrombocytopenia resolved. 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 GIB 03/10.  -dopplers performed 3/7 were negative  2. Multifactorial pain: Stop MSIR 15mg  q4H prn Abdominal distention/ discomfort continuesTrial hydrocodone  3. Mood: Distracted due to multiple medical issues. Underlying cognitive issues also clouding the picture. Will need a lot of ego support by the team. Neuropsychologist and LCSW to follow additionally for evaluation and support once patient gets into a routine.  4. Neuropsych: This patient is not capable of making decisions on his/her own behalf.  5. Hypernatremia: due to high volume losses. Will add HS IV hydration as doubt he'll be able to maintain adequate hydration on liquids by tsp. Change IVF to D5W with KCL.  6. Bilateral cardioembolic CVA: no blood thinners due to issues with bleeding?  7. ABLA: will continue to monitor H/H and transfuse for hgb <7.0. Currently ranging from 7.6- 8.4 range. Will check anemia panel. MCV 102. May benefit from aranesp.  8. Critical illness neuropathy/myopathy-  Protect heels, stretch heel cords  -AFO's at some point when mobility justifies them 9.  Toxic megacolon s/p total colectomy.  Has high output ileostomy but now reduced with reduced intake,. ? Developing ileus,Past general surgery to  reevaluate 10.Marland Kitchen Hypernatremia and hyperchloremia due to high output ileostomy, resolved on IVF hypotonic soln 11.  Hypo K will supplement via IVF 12.right lower lobe pneumonia appreciate pulmonary assistance LOS (Days) 8 A FACE TO FACE EVALUATION WAS PERFORMED  Eathel Pajak E 04/25/2012, 8:26 AM

## 2012-04-25 NOTE — Progress Notes (Addendum)
NUTRITION FOLLOW UP/CONSULT  DOCUMENTATION CODES  Per approved criteria   -Severe malnutrition in the context of acute illness or injury    Intervention:    1. Continue Resource Breeze po prn and Ensure Complete po prn. 2. To better meet nutrition needs and promote positive tolerance to enteral nutrition, will change formula to peptide-based, elemental formula. Once medically appropriate, initiate trickle feeds of Vital AF 1.2 at 10 ml/hr. This initial rate will provide: 288 kcal, 18 grams protein, 195 ml free water. If unable to tolerate EN, strongly recommend initiation of TPN. 3. Monitor magnesium, potassium, and phosphorus daily for at least 3 days, MD to replete as needed, as pt is at risk for refeeding syndrome given ongoing severe malnutrition. 4. RD to continue to follow nutrition care plan  Nutrition Dx:   Inadequate oral intake related to swallowing difficulty as evidenced by dietary recall and ongoing weight loss. Ongoing.  Goal:   Intake to meet >90% of estimated nutrition needs. Unmet.  Monitor:   weight trends, lab trends, I/O's, PO intake, supplement tolerance, TF initiation and tolerance  Assessment:   Hx of ulcerative colitis and diverticular perforation. Admitted to [facility] on 2/2 with multiple pseudomembranes consistent with C. difficile colitis and low colorectal stricture. Underwent exploratory laparotomy with cecostomy tube placement 2/3. 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 Post-op, developed ARDS with septic shock and renal failure. 2/6 transferred to [facility]. CRRT initiated. 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    Pt followed by RD staff during acute hospitalization. Pt with variable wt since admission. Admission wt 200 lbs, wife reports that this is patient's baseline weight.    Hospital course nutrition summary: 2/2 - 2/20: permissively underfed with EN 2/21-2/26: advanced to full feeds 2/26 - 3/3: feeds held for surgery 3/4 - 3/11: resumed full feeds 3/11 - 3/12: feeds held for GI bleed 3/13 - 3/24: received full feeds 3/24: TFs discontinued diet initiated; pt with very poor oral intake; tx to rehab 3/25 3/28 - 4/1: increased n/v - pt refusing almost all meals and supplements; wife declined calorie count, requesting TPN 4/2: NGT placed to suction, surgery ordering TF initiation; planning to tx back to acute  Pt was dx with severe malnutrition of acute illness by RD given 17.5% wt loss in >1 month and PO/nutrition support meeting </=75% of estimated needs for >1 month. Continues to meet criteria for this at this time.  NGT output of 400 cc yesterday. NGT currently clamped per RN and pt off unit receiving paracentesis.  Height: Ht Readings from Last 1 Encounters:  04/17/12 5' 10"  (1.778 m)    Weight Status:   Wt Readings from Last 1 Encounters:  04/18/12 158 lb 9.6 oz (71.94 kg)    Re-estimated needs:  Kcal: 2200 - 2400 Protein: 140 - 160 grams Fluid: at least 2.3 liters daily  Skin: stage II R arm, stage I sacrum  Diet Order:     Intake/Output Summary (Last 24 hours) at 04/25/12 1249 Last data filed at 04/25/12 1100  Gross per 24 hour  Intake      0 ml  Output   1100 ml  Net  -1100 ml    Last BM: 3/31 (150 ml output from ostomy yesterday)  Labs:   Recent Labs Lab 04/23/12 0715 04/24/12 1441 04/25/12 0005  NA 137 137 136  K 3.9  3.9 3.6  CL 108 106 107  CO2 19 19 20   BUN 31* 28* 25*  CREATININE 1.67* 1.61* 1.54*  CALCIUM 8.1* 8.1* 8.1*  MG  --   --  1.6  PHOS  --   --  3.7  GLUCOSE 95 77 77    CBG (last 3)   Recent Labs  04/24/12 2143 04/25/12 0710 04/25/12 1123  GLUCAP 80 73 78    Scheduled Meds: . aztreonam  1 g Intravenous Q8H  . chlorhexidine  15 mL Mouth Rinse BID  . dextrose 5 % 1,000 mL with potassium  chloride 20 mEq infusion   Intravenous Custom  . feeding supplement (JEVITY 1.2 CAL)  1,000 mL Per Tube Q24H  . ferrous sulfate  300 mg Per Tube BID WC  . insulin aspart  0-5 Units Subcutaneous QHS  . insulin aspart  0-9 Units Subcutaneous TID WC  . lidocaine  1 patch Transdermal Q24H  . lip balm   Topical BID  . loperamide  2 mg Oral QHS  . pantoprazole sodium  40 mg Per Tube Q1200  . prochlorperazine  10 mg Intravenous Q6H  . saccharomyces boulardii  250 mg Oral BID    Continuous Infusions:  none  Inda Coke MS, RD, LDN Pager: [phone] After-hours pager: [phone]

## 2012-04-25 NOTE — Discharge Summary (Signed)
Physician Discharge Summary  Patient ID: Connor Small MRN: [id number] DOB/AGE: 1961/01/07 51 y.o.  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  CT ABDOMEN AND PELVIS WITHOUT CONTRAST  Technique:  Multidetector CT imaging of the abdomen and pelvis was performed following the standard protocol without intravenous contrast.  Comparison: 03/11/2012  Findings: Bilateral pleural effusions, greater on the left, with bilateral infiltration or atelectasis in the lung bases.  This is slightly progressive since previous study.  Diffuse upper abdominal free fluid.  This is progressing since previous study.  Hounsfield unit measurements are somewhat increased, suggesting possible hemorrhage versus complex fluid. Fluid extends down the pericolic gutters and anteriorly in the pelvis. There appears to be loculated fluid deforming the spleen which may represent subcapsular fluid or hematoma.  The liver, gallbladder pancreas, adrenal glands, and retroperitoneal lymph nodes are unremarkable.  Calcification of the aorta without aneurysm.  Flattened inferior vena cava may represent hypovolemia. Tiny punctate stone in the lower pole of the  right kidney without obstruction.  No hydronephrosis in either kidney.  No free air in the abdomen.  Postoperative changes with colectomy and right lower quadrant ileostomy.  Small bowel are not distended and contrast material flows to the ileostomy suggesting no evidence of obstruction.  There is an NG tube in place in the stomach is decompressed.  Midline incisional defect in the soft tissues.  Pelvis:  Extensive ascites extending from above.  Bladder wall is not thickened.  Prominent sized prostate gland, measuring about 4.7 x 3.3 cm.  No focal inflammatory no significant pelvic lymphadenopathy.  Mild degenerative changes in the spine and hips.  IMPRESSION: Increasing free fluid in the abdomen and pelvis.  Hounsfield unit measurements are somewhat increased, suggesting possible hemorrhage or complex fluid.  Loculated fluid in the left upper quadrant deforms the spleen and could represent a subcapsular fluid collection or hematoma.  Increasing bilateral pleural effusions with basilar atelectasis.  No evidence of obstruction.   Original Report Authenticated By: Burman Nieves, M.D.    04/24/2012  *RADIOLOGY REPORT*  Clinical Data: Abdominal pain and distension, NG tube placement  ABDOMEN - 1 VIEW  Comparison: 04/23/2012  Findings: NG tube projects over the pyloric region of the stomach. There are no abnormally dilated loops of bowel.  IMPRESSION: Nonobstructive gas pattern is similar to prior study   Original Report Authenticated By: Esperanza Heir, M.D.    Dg Chest Port 1 View  04/24/2012  *RADIOLOGY REPORT*  Clinical Data: Left pleural effusion  PORTABLE CHEST - 1 VIEW  Comparison: April 21, 2012.  Findings: No change is seen involving moderate left pleural effusion.  Continued right basilar opacity is noted concerning for pneumonia or subsegmental atelectasis.  No pneumothorax is noted. Cardiomediastinal silhouette  is within normal limits.  IMPRESSION: Moderate left pleural effusion.  Mild right basilar  opacity concerning for pneumonia or subsegmental atelectasis.   Original Report Authenticated By: Lupita Raider.,  M.D.    [facility] 1 View  04/21/2012  *RADIOLOGY REPORT*  Clinical Data: Fever.  Possible aspiration pneumonia. Cardiopulmonary arrest.  PORTABLE CHEST - 1 VIEW  Comparison: 04/16/2012  Findings: Stable left pleural effusion with associated airspace opacity favoring atelectasis.  New consolidation in the right lung base, probably in the right lower lobe.  Feeding tube has been removed.  Left IJ line has been removed.  IMPRESSION:  1.  New airspace opacity at the right lung base, favoring pneumonia.  Aspiration pneumonitis is not excluded. 2.  Continued large left pleural effusion with associated left sided airspace opacity favoring atelectasis.   Original Report Authenticated By: Gaylyn Rong, M.D.      Labs:  Basic Metabolic Panel:  Recent Labs Lab 04/19/12 1517 04/20/12 0555 04/21/12 0840 04/23/12 0715 04/24/12 1441 04/25/12 0005  NA 156* 147* 148* 137 137 136  K 3.1* 3.9 3.8 3.9 3.9 3.6  CL 124* 115* 117* 108 106 107  CO2 23 22 20 19 19 20   GLUCOSE 101* 332* 113* 95 77 77  BUN 34* 31* 32* 31* 28* 25*  CREATININE 1.36* 1.43* 1.61* 1.67* 1.61* 1.54*  CALCIUM 8.6 8.2* 8.5 8.1* 8.1* 8.1*  MG  --   --   --   --   --  1.6  PHOS  --   --   --   --   --  3.7    CBC:  Recent Labs Lab 04/21/12 0840 04/23/12 0715 04/24/12 1441 04/25/12 0005  WBC 14.7* 13.4* 15.6* 16.7*  NEUTROABS 13.1*  --   --  13.8*  HGB 7.8* 7.3* 8.2* 8.0*  HCT 24.7* 21.9* 24.4* 23.9*  MCV 98.0 93.6 92.1 91.9  PLT 350 341 384 403*    CBG:  Recent Labs Lab 04/24/12 0722 04/24/12 1131 04/24/12 1646 04/24/12 2143 04/25/12 0710  GLUCAP 88 78 72 80 73    Brief HPI:   Connor Small is a 52 y.o. right-handed male with history of ulcerative colitis as well as prior diverticular perforation, who was admitted to [facility] on 02/26/2012 with c-diff colitis requiring exp lap with  cecostomy tube placement per Dr. Dian Situ 02/27/2012. 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 He was transferred to [facility]  On 02/06 due to septic shock with ARDS, renal failure, A Fib with RVR. Was treated with broad based antibiotics as well as CRRT. He was followed by [facility] for surgical issues including wound evisceration requiring multiple procedures with closure and placement of retention sutures on 03/09 by Dr. Johna Sheriff. 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 Madilyn Fireman.    There was question of HIT on heparin as well as bleeding with argatroban with GIB 03/10. BLE dopplers done due to LE edema and have been negative for DVT.  Anemia due to multiple medical  issues has been treated with multiple units of PRBC and most recent Hgb at 7.6.  Neurology was consulted due to diffuse weakness and RUE weakness. MRI brain revealed small acute to subacute bicerebral hemispheres, brainstem and right cerebellum infarcts likely embolic from cardiac source. Dr. Pearlean Brownie recommended ASA for now and anticoagulation once thrombocytopenia/LFTs resolve and patient able to swallow safely.  Panda was discontinued on 03/24 and diet advanced to D3, thin liquids by tsp. Patient was noted to be diffusely weak due to critical illness myopathy with neuropathy. Therapy team recommended CIR and patient was admitted for progressive therapies.    Hospital Course: JAHDAI BEYERS was admitted to rehab 04/17/2012 for inpatient therapies to consist of PT, ST and OT at least three hours five days a week. Past admission physiatrist, therapy team and rehab RN have worked together to provide customized collaborative inpatient rehab. Blood pressures were monitored on bid basis and have been ranging form mid 90s to low 100. ASA was added for CVA prophylaxis on  3/29 past input from surgery.   BLE  dopplers done due to tetraplegia and were negative for DVT on 3/26. Metadone was discontinued but nausea continued on trial of morphine and hydrocodone.  Po intake has been poor and patient with dislike of nutritional supplements.  He was noted to have hyperchloremia and hypernatremia therefore was started on dextrose for hydration at nightime. IV zofran qid was infective therefore he was changed to compazine yesterday. Medications have been minimized to help with GI symptoms.    He developed RLL aspiration PNA with leucocytosis due to emesis episode on 04/21/12. CCM was consulted and recommended Vancomycin and Azactam for HCAP. Vancomycin was discontinued on 03/31. Dr. Bary Richard has been following for input and recommended placement of  NGT due to ongoing GI symptoms with suspicion of ileus. He wanted to hold off on TPN for at least 24 hours to avoid any potential source of infection.   [facility] was contacted for input and recommended CT abdomen for work up. This reveals increasing fluid in abdomen and pelvis with loculated fluid in LUQ. Decision to be made requiring drainage v/s surgical intervention.   Abdominal wound was debrided by Dr. Michaell Cowing at bedside and bolsters and retention suture were removed on 04/01. NGT was placed yesterday without improvement in GI symptoms. This is at intermittent suction and is draining bile. H/H shows some improvement.  Leucocytosis continues with WBC up to 15.6 despite IV antibiotics. TH-Dr. Susie Cassette was contacted to assist and manage patient on acute services due to ongoing medical issues and inability to tolerate intensive therapies.   Therapy course:  Patient is making slow steady progress with therapy. He continues to be limited by tetraplegia with significant atrophy and weakness bilat UE and LE R > L weakness, impaired motor control, timing, sequencing, impaired coordination, apraxia, impaired activity tolerance/endurance, pain, impaired attention, memory, impaired head and  postural control. He's at total assist of one to two persons with intermittent use of lift for bed mobility and transfers. He's mod-max assist for sitting balance and total assist of tilt in space WC for mobility. He's requiring max to total assist for ADL tasks. He requires max assist with verbal, visual and tactile cues for breathing and pharyngeal strengthening exercises with ST. Family has been educated on performing these exercises 2-3 times a day.    Disposition: 62-Rehab Facility   Scheduled Meds: . aztreonam  1 g Intravenous Q8H  . chlorhexidine  15 mL Mouth Rinse BID  . dextrose 5 % 1,000 mL with potassium chloride 20 mEq infusion   Intravenous Custom  . ferrous sulfate  325 mg Oral BID WC  . insulin aspart  0-5 Units Subcutaneous QHS  . insulin aspart  0-9 Units Subcutaneous TID WC  . lidocaine  1 patch Transdermal Q24H  . lip balm   Topical BID  . loperamide  2 mg Oral QHS  . pantoprazole  40 mg Oral Q1200  . prochlorperazine  10 mg Intravenous Q6H  . saccharomyces boulardii  250 mg Oral BID     PRN Meds:.acetaminophen, alum & mag hydroxide-simeth, bismuth subsalicylate, diphenhydrAMINE, guaiFENesin-dextromethorphan, HYDROcodone-acetaminophen, lactated ringers, lidocaine, loperamide, magic mouthwash, menthol-cetylpyridinium, morphine injection, phenol, simethicone    Follow-up Information   Follow up with Mariella Saa, MD.   Contact information:   [address] [place] [address] [phone]       Follow up with Barrie Folk, MD.   Contact information:   [address]                         [place] [address] [phone]       Follow up with Sebastian Ache, MD.   Contact information:   [address] [place] [address] [phone]       Follow up with Gates Rigg, MD.   Contact information:   [address] [place] [address] [phone]       Signed: Jacquelynn Cree 04/25/2012, 10:26 AM

## 2012-04-25 NOTE — Procedures (Signed)
US guided diagnostic/therapeutic paracentesis performed yielding 2.9 liters clear yellow fluid. A portion of the fluid was sent to the lab for culture. No immediate complications.

## 2012-04-25 NOTE — H&P (Signed)
[facility] History and Physical  Connor Small ONG:[id number] DOB: December 05, 1960 DOA: 04/17/2012  Referring physician: Jacquelynn Cree, PA  PCP: No primary provider on file.   Chief Complaint: Healthcare associated pneumonia   HPI:  [name] is a 52 y.o. right-handed male with history of ulcerative colitis as well as prior diverticular perforation, who was admitted to [facility] on 02/26/2012 with c-diff colitis requiring exp lap with cecostomy tube placement per Dr. Dian Situ 02/27/2012. 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 He was transferred to [facility] On 02/06 due to septic shock with ARDS, renal failure, A Fib with RVR. Was treated with broad based antibiotics as well as CRRT. He was followed by [facility] for surgical issues including wound evisceration requiring multiple procedures with closure and placement of retention sutures on 03/09 by Dr. Johna Sheriff. 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 Madilyn Fireman.   There was question of HIT on heparin as well as bleeding with argatroban with GIB 03/10. BLE dopplers done due to LE edema and have been negative for DVT. Anemia due to multiple medical issues has been treated with multiple units of PRBC and most recent Hgb at 7.6. Neurology was consulted due to diffuse weakness and RUE weakness. MRI brain revealed small acute to subacute bicerebral hemispheres, brainstem and right cerebellum infarcts likely embolic from cardiac source. Dr. Pearlean Brownie recommended ASA for now and anticoagulation once thrombocytopenia/LFTs resolve and patient able to swallow safely. Panda was discontinued on 03/24 and diet advanced to D3, thin liquids by tsp. Patient was noted to be diffusely weak due to critical illness myopathy with neuropathy. Therapy team recommended CIR and patient was admitted for progressive therapies on  3/25.   Blood pressures  ranging form mid 90s to low 100. ASA was added for CVA prophylaxis on 3/29 past input from surgery. BLE dopplers done due to tetraplegia and were negative for DVT on 3/26. Metadone was discontinued but nausea continued on trial of morphine and hydrocodone. Po intake has been poor . The patient developed nausea, and vomiting, also developed right lower lobe pneumonia with aspiration and leukocytosis on 3/29, critical care was reconsulted and they recommend vancomycin and Azactam for  HCAP. Vancomycin was discontinued 3/31  There is a suspicion for ileus. Critical care wanted to hold off on tube feeds because of concern about infection. Critical care recommended CT scan of the abdomen that showed loculated left upper quadrant fluid.  Abdominal wound was debrided by Dr. Michaell Cowing at bedside and bolsters and retention suture were removed on 04/01. NGT was placed yesterday without improvement in GI symptoms. This is at intermittent suction and is draining bile. H/H shows some improvement. Leucocytosis continues with WBC up to 15.6 despite IV antibiotics. Patient was transferred to acute hospital care because of the above reasons.        Review of Systems: negative for the following  Constitutional: Denies fever, chills, diaphoresis, appetite change and fatigue.  HEENT: Denies photophobia, eye pain, redness, hearing loss, ear pain, congestion, sore throat, rhinorrhea, sneezing, mouth sores, trouble swallowing, neck pain, neck stiffness and tinnitus.  Respiratory: Denies SOB, DOE, cough, chest tightness, and wheezing.  Cardiovascular: Denies chest pain, palpitations and leg swelling.  Gastrointestinal: Denies nausea, vomiting, abdominal pain, diarrhea, constipation, blood in stool and abdominal distention.  Genitourinary: Denies dysuria, urgency, frequency, hematuria, flank pain and difficulty urinating.  Musculoskeletal: Denies myalgias,  back pain, joint swelling, arthralgias and  gait problem.  Skin: Denies pallor, rash and wound.  Neurological: Denies dizziness, seizures, syncope, weakness, light-headedness, numbness and headaches.  Hematological: Denies adenopathy. Easy bruising, personal or family bleeding history  Psychiatric/Behavioral: Denies suicidal ideation, mood changes, confusion, nervousness, sleep disturbance and agitation       Past Medical History  Diagnosis Date  . Chronic diarrhea   . Rectal bleed   . Hemorrhoids   . Ulcerative colitis     Distal UC over 8 yrs ago diagnosed  . Diverticulitis of large intestine with perforation 10/2011    done at [facility]  . 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 history of UC (ulcerative colitis) 12/06/2010     Past Surgical History  Procedure Laterality Date  . Colonoscopy  9/03  . Sigmoidoscopy       06/13/2002  . Temporary ostomy November 06, 2010      for a colon perforation that was done in [facility] (Dr Ruben Im).    . Colonoscopy  02/16/2011    Procedure: COLONOSCOPY;  Surgeon: Malissa Hippo, MD;  Location: [facility];  Service: Endoscopy;  Laterality: N/A;  100  . Flexible sigmoidoscopy  02/27/2012    Procedure: FLEXIBLE SIGMOIDOSCOPY;  Surgeon: Malissa Hippo, MD;  Location: [facility];  Service: Endoscopy;  Laterality: N/A;  with colonic decompression  . Laparotomy  02/27/2012    Procedure: EXPLORATORY LAPAROTOMY;  Surgeon: Fabio Bering, MD;  Location: [facility] ORS;  Service: General;  Laterality: N/A;  . Cecostomy  02/27/2012    Procedure: CECOSTOMY;  Surgeon: Fabio Bering, MD;  Location: [facility] ORS;  Service: General;  Laterality: N/A;  Cecostomy Tube Placement  . Colectomy  02/28/2012    Procedure: TOTAL COLECTOMY;  Surgeon: Fabio Bering, MD;  Location: [facility] ORS;  Service: General;  Laterality: N/A;  . Ileostomy  02/28/2012    Procedure: ILEOSTOMY;  Surgeon: Fabio Bering, MD;  Location: [facility] ORS;  Service: General;  Laterality: N/A;  . Cystoscopy w/ ureteral stent placement Bilateral 03/20/2012     Procedure: CYSTOSCOPY WITH RETROGRADE PYELOGRAM/URETERAL STENT PLACEMENT;  Surgeon: Sebastian Ache, MD;  Location[facility] OR;  Service: Urology;  Laterality: Bilateral;  . Laparotomy N/A 03/26/2012    Procedure: EXPLORATORY LAPAROTOMY;  Surgeon: Mariella Saa, MD;  Location: [facility] OR;  Service: General;  Laterality: N/A;  . Application of wound vac N/A 03/26/2012    Procedure: APPLICATION OF WOUND VAC;  Surgeon: Mariella Saa, MD;  Location: [facility] OR;  Service: General;  Laterality: N/A;  . Liver biopsy N/A 03/26/2012    Procedure: LIVER BIOPSY;  Surgeon: Mariella Saa, MD;  Location: [facility] OR;  Service: General;  Laterality: N/A;  . Laparotomy N/A 03/29/2012    Procedure: EXPLORATORY LAPAROTOMY, PARTIAL WOUND CLOSURE;  Surgeon: Mariella Saa, MD;  Location: [facility] OR;  Service: General;  Laterality: N/A;  . Vacuum assisted closure change N/A 03/29/2012    Procedure: Open ABDOMINAL VACUUM  CHANGE;  Surgeon: Mariella Saa, MD;  Location: [facility] OR;  Service: General;  Laterality: N/A;  . Vacuum assisted closure change N/A 04/01/2012    Procedure: removal of abdominal vac dressing and abdominal closure;  Surgeon: Mariella Saa, MD;  Location: [facility] OR;  Service: General;  Laterality: N/A;      Social History:  reports that he quit smoking about 27 years ago. His smoking use included Cigarettes. He smoked 0.00 packs per day. He has never used smokeless tobacco.  He reports that he does not drink alcohol or use illicit drugs.    Allergies  Allergen Reactions  . Penicillins Other (See Comments)    Heart rate changes  . Morphine And Related     Nausea/vomiting  . Oxycodone     Nausea/vomiting  . Imipenem Rash    Family History  Problem Relation Age of Onset  . Cancer Sister   . Healthy Daughter   . Hypertension Mother      Prior to Admission medications   Medication Sig Start Date End Date Taking? Authorizing Provider  bifidobacterium infantis (ALIGN) capsule Take 1 capsule by mouth  daily. 07/18/11 07/17/12  Malissa Hippo, MD  FORTESTA 10 MG/ACT (2%) GEL at bedtime. Patient states that he applies it to his leg 12/05/11   Historical Provider, MD  Mesalamine (ASACOL HD) 800 MG TBEC Take 2 tablets (1,600 mg total) by mouth 2 (two) times daily. 01/16/12   Malissa Hippo, MD  Pseudoeph-Doxylamine-DM-APAP (NYQUIL D COLD/FLU) 60-12.06-22-998 MG/30ML LIQD Take 30 mLs by mouth at bedtime. Cold/flu symptoms    Historical Provider, MD  Pseudoephedrine-APAP-DM (DAYQUIL MULTI-SYMPTOM COLD/FLU PO) Take 1 capsule by mouth every 4 (four) hours as needed. Cold symptoms    Historical Provider, MD     Physical Exam: Filed Vitals:   04/25/12 0500 04/25/12 1139 04/25/12 1206 04/25/12 1219  BP: 102/66 103/64 99/65 106/69  Pulse: 109     Temp: 98.2 F (36.8 C)     TempSrc: Oral     Resp: 16     Height:      Weight:      SpO2: 97%        Constitutional: Vital signs reviewed. Patient is a well-developed and well-nourished in no acute distress and cooperative with exam. Alert and oriented x3.  Head: Normocephalic and atraumatic  Ear: TM normal bilaterally  Mouth: no erythema or exudates, MMM  Eyes: PERRL, EOMI, conjunctivae normal, No scleral icterus.  Neck: Supple, Trachea midline normal ROM, No JVD, mass, thyromegaly, or carotid bruit present.  Cardiovascular: RRR, S1 normal, S2 normal, no MRG, pulses symmetric and intact bilaterally  Pulmonary/Chest: CTAB, no wheezes, rales, or rhonchi  Abdominal: Soft. Non-tender, non-distended, bowel sounds are normal, no masses, organomegaly, or guarding present.  GU: no CVA tenderness Musculoskeletal: No joint deformities, erythema, or stiffness, ROM full and no nontender Ext: no edema and no cyanosis, pulses palpable bilaterally (DP and PT)  Hematology: no cervical, inginal, or axillary adenopathy.  Neurological: A&O x3, Strenght is normal and symmetric bilaterally, cranial nerve II-XII are grossly intact, no focal motor deficit, sensory  intact to light touch bilaterally.  Skin: Warm, dry and intact. No rash, cyanosis, or clubbing.  Psychiatric: Normal mood and affect. speech and behavior is normal. Judgment and thought content normal. Cognition and memory are normal.       Labs on Admission:    Basic Metabolic Panel:  Recent Labs Lab 04/20/12 0555 04/21/12 0840 04/23/12 0715 04/24/12 1441 04/25/12 0005  NA 147* 148* 137 137 136  K 3.9 3.8 3.9 3.9 3.6  CL 115* 117* 108 106 107  CO2 22 20 19 19 20   GLUCOSE 332* 113* 95 77 77  BUN 31* 32* 31* 28* 25*  CREATININE 1.43* 1.61* 1.67* 1.61* 1.54*  CALCIUM 8.2* 8.5 8.1* 8.1* 8.1*  MG  --   --   --   --  1.6  PHOS  --   --   --   --  3.7  Liver Function Tests:  Recent Labs Lab 04/21/12 0840 04/24/12 1441 04/25/12 0005  AST 19 30 37  ALT 24 27 28   ALKPHOS 297* 291* 294*  BILITOT 1.5* 1.3* 1.2  PROT 5.6* 5.2* 5.0*  ALBUMIN 1.6* 1.4* 1.4*   No results found for this basename: LIPASE, AMYLASE,  in the last 168 hours No results found for this basename: AMMONIA,  in the last 168 hours CBC:  Recent Labs Lab 04/19/12 0620 04/21/12 0840 04/23/12 0715 04/24/12 1441 04/25/12 0005  WBC 10.3 14.7* 13.4* 15.6* 16.7*  NEUTROABS  --  13.1*  --   --  13.8*  HGB 7.4* 7.8* 7.3* 8.2* 8.0*  HCT 24.8* 24.7* 21.9* 24.4* 23.9*  MCV 104.2* 98.0 93.6 92.1 91.9  PLT 317 350 341 384 403*   Cardiac Enzymes: No results found for this basename: CKTOTAL, CKMB, CKMBINDEX, TROPONINI,  in the last 168 hours  BNP (last 3 results)  Recent Labs  04/10/12 0650  PROBNP 239.6*      CBG:  Recent Labs Lab 04/24/12 1131 04/24/12 1646 04/24/12 2143 04/25/12 0710 04/25/12 1123  GLUCAP 78 72 80 73 78    Radiological Exams on Admission: Ct Abdomen Pelvis Wo Contrast  04/24/2012  *RADIOLOGY REPORT*  Clinical Data: Abdominal pain and distension.  CT ABDOMEN AND PELVIS WITHOUT CONTRAST  Technique:  Multidetector CT imaging of the abdomen and pelvis was performed  following the standard protocol without intravenous contrast.  Comparison: 03/11/2012  Findings: Bilateral pleural effusions, greater on the left, with bilateral infiltration or atelectasis in the lung bases.  This is slightly progressive since previous study.  Diffuse upper abdominal free fluid.  This is progressing since previous study.  Hounsfield unit measurements are somewhat increased, suggesting possible hemorrhage versus complex fluid. Fluid extends down the pericolic gutters and anteriorly in the pelvis. There appears to be loculated fluid deforming the spleen which may represent subcapsular fluid or hematoma.  The liver, gallbladder pancreas, adrenal glands, and retroperitoneal lymph nodes are unremarkable.  Calcification of the aorta without aneurysm.  Flattened inferior vena cava may represent hypovolemia. Tiny punctate stone in the lower pole of the right kidney without obstruction.  No hydronephrosis in either kidney.  No free air in the abdomen.  Postoperative changes with colectomy and right lower quadrant ileostomy.  Small bowel are not distended and contrast material flows to the ileostomy suggesting no evidence of obstruction.  There is an NG tube in place in the stomach is decompressed.  Midline incisional defect in the soft tissues.  Pelvis:  Extensive ascites extending from above.  Bladder wall is not thickened.  Prominent sized prostate gland, measuring about 4.7 x 3.3 cm.  No focal inflammatory no significant pelvic lymphadenopathy.  Mild degenerative changes in the spine and hips.  IMPRESSION: Increasing free fluid in the abdomen and pelvis.  Hounsfield unit measurements are somewhat increased, suggesting possible hemorrhage or complex fluid.  Loculated fluid in the left upper quadrant deforms the spleen and could represent a subcapsular fluid collection or hematoma.  Increasing bilateral pleural effusions with basilar atelectasis.  No evidence of obstruction.   Original Report Authenticated  By: Burman Nieves, M.D.    Dg Abd 1 View  04/24/2012  *RADIOLOGY REPORT*  Clinical Data: Abdominal pain and distension, NG tube placement  ABDOMEN - 1 VIEW  Comparison: 04/23/2012  Findings: NG tube projects over the pyloric region of the stomach. There are no abnormally dilated loops of bowel.  IMPRESSION: Nonobstructive gas pattern is similar to prior  study   Original Report Authenticated By: Esperanza Heir, M.D.    Dg Chest Port 1 View  04/24/2012  *RADIOLOGY REPORT*  Clinical Data: Left pleural effusion  PORTABLE CHEST - 1 VIEW  Comparison: April 21, 2012.  Findings: No change is seen involving moderate left pleural effusion.  Continued right basilar opacity is noted concerning for pneumonia or subsegmental atelectasis.  No pneumothorax is noted. Cardiomediastinal silhouette is within normal limits.  IMPRESSION: Moderate left pleural effusion.  Mild right basilar opacity concerning for pneumonia or subsegmental atelectasis.   Original Report Authenticated By: Lupita Raider.,  M.D.    Dg Abd Portable 1v  04/23/2012  *RADIOLOGY REPORT*  Clinical Data: 52 year old male with nausea.  Possible ileus.  PORTABLE ABDOMEN - 1 VIEW  Comparison: 04/21/2012 and earlier.  Findings: Supine portable AP view at 1624 hours.  Increased gastric and small bowel gas.  Pattern is nonobstructed.  No dilated loops are identified.  Stable abdominal and pelvic visceral contours. Stable visualized osseous structures.  IMPRESSION: Increased gastric and small bowel gas in a nonobstructed bowel gas pattern.   Original Report Authenticated By: Erskine Speed, M.D.     EKG: Independently reviewed.   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 Healthcare associated pneumonia started on vancomycin and Azactam on 3/29, blood cultures drawn, chest x-ray consistent with right lower lobe airspace disease and left pleural effusion, continue aspiration precautions, monitor for recurrent C. Difficile  2. C. difficile colitis clinically stable  3. ATN/acute renal failure creatinine has been climbing up, creatinine of 3/24 was 1.25, today it is 1.67. Patient has been on D5 because of hypernatremia. Will change to normal saline given low blood pressure  4. Loculated left upper quadrant fluid, followed by surgery, persistent subcapsular hematoma of the spleen, Dr. gross recommends, paracentesis  5.Nutrition-because of nausea suspected ileus, NG tube was placed, start tube feeding through nasogastric tube  Code Status:   full Family Communication: bedside Disposition Plan: admit   Time spent: 70 mins   [facility] Pager [phone]  If 7PM-7AM, please contact night-coverage [email or web address] Password [facility] 04/25/2012, 12:41 PM

## 2012-04-25 NOTE — Consult Note (Signed)
Name: Connor Small   MRN: [id number]  DOB: [birth year]   ADMISSION DATE: 04/17/2012  CONSULTATION DATE: 3/29  REFERRING MD : Dr Cato Mulligan    CHIEF COMPLAINT: Aspiration PNA?   BRIEF PATIENT DESCRIPTION:  5 YOM admitted to [facility] for Cdiff on 2/2. Underwent decompressive cecostomy 2/3, Hospital course complicated by ARDS, PNA, septic shock, renal failure, wound dehiscence and required multiple OR visits. To [facility] rehab 3/25.  Concern for aspiration event 3/29 with nausea / vomiting, PCCM consulted.  4/2 patient transferred back to inpatient care due to noted fluid collection in abdomen, deconditioning.    SIGNIFICANT EVENTS / STUDIES:  2/02 - Admit to [facility] with abdomen pain  2/03 - Decompressive Cecostomy  2/04 - Total abdominal colectomy and end ileostomy for toxic megacolon  2/06 - New A-fib and transfer to [facility]  2/09 - thora left 1200 exudative  2/10 - CT HEAD>> Old infarction in the right cerebellum and in the left frontal parietal white matter  2/10 - CT abdo /pelvis- small hematoma likely subcapsular spleen, JPs wnl  2/11 - borderline BP, pos balance, tube feeds started  2/11 - clot around picc, picc dc'ed  2/12 - continued borderline BP, neg balance 1 liter  2/14 - neo required, 1.2 liters neg, air hunger, changed to PS  2/15 - neg 4 l bal on lasix drip, lasix d/c  2/16 - poor neurostatus, ct head neg  2/17 - cvvhd, high pressor needs  2/18 - some slight reduction in pressors  2/19 [place] abdo #2>>>Multifactorial degradation. Overlying bowel gas and patientclinical status. No explanation for elevated liver function tests.  Similar to slight increase in size of a perisplenic fluidcollection.  Right pleural effusion  2/20 - reduction pressors, improved alertness  2/21 - bleeding overnight from JP's, argatroban turned off, lower pressor needs  2/22- off pressors (yeah), on RA (yeah)  2/23 - neg from JPs again, inr reducing  2/24 - concern dehiscence , some improved renal fxn  with increased volume  2/24 - possible dye change in output after dye 1 hr  2/25 - elative brady  2/25 - cysto neg injury  3/25 - to CIR for rehab 3/28 Called back to see on rehab for ? Asp  4/01- continued nausea 4/02 - CT abd with pocket of fluid collection, drained per IR, tx back to inpatient care due to profound deconditioning, n/v, fluid collection  CULTURES:  3/29 BC x 2>>>    ANTIBIOTICS: PCN, Imipenem ALLERGIC - rash S/p full tratemtn 3 weeks cdiff   Aztreonam 3/29>>>  vanc 3/29>>> 3/31   Subjective:  Patient reports he feels better than yesterday, less nausea.  Denies shortness of breath.   Filed Vitals:   04/25/12 1139 04/25/12 1206 04/25/12 1219 04/25/12 1313  BP: 103/64 99/65 106/69 112/64  Pulse:    111  Temp:    97.7 F (36.5 C)  TempSrc:      Resp:      Height:      Weight:      SpO2:    99%    Recent Labs Lab 04/23/12 0715 04/24/12 1441 04/25/12 0005  HGB 7.3* 8.2* 8.0*  HCT 21.9* 24.4* 23.9*  WBC 13.4* 15.6* 16.7*  PLT 341 384 403*    Recent Labs Lab 04/20/12 0555 04/21/12 0840 04/23/12 0715 04/24/12 1441 04/25/12 0005  NA 147* 148* 137 137 136  K 3.9 3.8 3.9 3.9 3.6  CL 115* 117* 108 106 107  CO2 22 20 19  19 20  GLUCOSE 332* 113* 95 77 77  BUN 31* 32* 31* 28* 25*  CREATININE 1.43* 1.61* 1.67* 1.61* 1.54*  CALCIUM 8.2* 8.5 8.1* 8.1* 8.1*  MG  --   --   --   --  1.6  PHOS  --   --   --   --  3.7   CXR 4/1 - Moderate left pleural effusion. Mild right basilar opacity concerning for pneumonia or subsegmental atelectasis - improved  ASSESSMENT / PLAN:   PULMONARY  A:  Lt pleural effusion.  R Linear Airspace Disease - 3/29 c/w asp vs HCAP   P:  -IS  -Increase activity as able -abx as above -aspiration precautions / upright positioning -PCXR is improved 4/1, max IS -see ID -no role thora at this stage   INFECTIOUS  A:  C diff colitis, resolved clinically  ASP PNA vs HCAP 3/29  - Remains culture neg also ARF, dc vanc.     P: -Continue aztreonam, will change to oral abx in am and limit course, consider 5 days -hence after todays dose, dc all ABX and follow course  ATN / ARF/ Hypovolemia   P: -Chem PRN -Follow ostomy output closely, allow for positive balance to even -strict I/O's -folow ostomy output closely, if crt bumps with neg balance, give volume  Nausea  - KUB with non-obstructive bowel gas pattern, r/o ileus, at risk SBO (has output from ostomy)  P: -held ASA (continues d/t embolic cva) with nausea, will plan restart in 48 hrs, remains in SR - calculate [place] score, fro future definitive needs -Limit narcotics as able  -compazine scheduled -  May help food tolerance? -wife doesn't want Carafate ("makes him nauseated") -continue NGT, begin feedings per surgery recommendations -CT Abd reviewed -NPO, re eval swallow fxn post ngt when [place] better -AVOID TPN for now  Goal would be to start TF today and increase slowly intake  May need SSRI  I have fully examined this patient and agree with above findings.    And edited infull  Mcarthur Rossetti. Tyson Alias, MD, FACP Pgr: [phone] [facility]     [name], NP-C [facility] Pgr: [phone] or [phone]  I

## 2012-04-26 DIAGNOSIS — D62 Acute posthemorrhagic anemia: Secondary | ICD-10-CM

## 2012-04-26 DIAGNOSIS — A0472 Enterocolitis due to Clostridium difficile, not specified as recurrent: Secondary | ICD-10-CM

## 2012-04-26 DIAGNOSIS — G7281 Critical illness myopathy: Secondary | ICD-10-CM

## 2012-04-26 DIAGNOSIS — N179 Acute kidney failure, unspecified: Secondary | ICD-10-CM | POA: Diagnosis present

## 2012-04-26 DIAGNOSIS — J690 Pneumonitis due to inhalation of food and vomit: Secondary | ICD-10-CM

## 2012-04-26 DIAGNOSIS — E43 Unspecified severe protein-calorie malnutrition: Secondary | ICD-10-CM

## 2012-04-26 DIAGNOSIS — R188 Other ascites: Secondary | ICD-10-CM | POA: Diagnosis not present

## 2012-04-26 DIAGNOSIS — E876 Hypokalemia: Secondary | ICD-10-CM | POA: Diagnosis present

## 2012-04-26 DIAGNOSIS — N182 Chronic kidney disease, stage 2 (mild): Secondary | ICD-10-CM | POA: Diagnosis present

## 2012-04-26 DIAGNOSIS — D638 Anemia in other chronic diseases classified elsewhere: Secondary | ICD-10-CM

## 2012-04-26 HISTORY — DX: Pneumonitis due to inhalation of food and vomit: J69.0

## 2012-04-26 HISTORY — DX: Enterocolitis due to Clostridium difficile, not specified as recurrent: A04.72

## 2012-04-26 LAB — GLUCOSE, CAPILLARY
Glucose-Capillary: 85 mg/dL (ref 70–99)
Glucose-Capillary: 79 mg/dL (ref 70–99)
Glucose-Capillary: 89 mg/dL (ref 70–99)
Glucose-Capillary: 96 mg/dL (ref 70–99)

## 2012-04-26 LAB — CBC
MCH: 30.5 pg (ref 26.0–34.0)
MCHC: 33.2 g/dL (ref 30.0–36.0)
RDW: 17 % — ABNORMAL HIGH (ref 11.5–15.5)

## 2012-04-26 LAB — HEMOGLOBIN A1C
Hgb A1c MFr Bld: 5 % (ref <5.7)
Mean Plasma Glucose: 97 mg/dL (ref <117)

## 2012-04-26 LAB — COMPREHENSIVE METABOLIC PANEL
ALT: 23 U/L (ref 0–53)
Albumin: 1.2 g/dL — ABNORMAL LOW (ref 3.5–5.2)
Alkaline Phosphatase: 296 U/L — ABNORMAL HIGH (ref 39–117)
BUN: 22 mg/dL (ref 6–23)
Calcium: 7.8 mg/dL — ABNORMAL LOW (ref 8.4–10.5)
Potassium: 3.3 mEq/L — ABNORMAL LOW (ref 3.5–5.1)
Sodium: 140 mEq/L (ref 135–145)
Total Protein: 4.8 g/dL — ABNORMAL LOW (ref 6.0–8.3)

## 2012-04-26 MED ORDER — VITAL AF 1.2 CAL PO LIQD
1000.0000 mL | ORAL | Status: DC
  Administered 2012-04-26 – 2012-04-29 (×3): 1000 mL
  Filled 2012-04-26 (×5): qty 1000

## 2012-04-26 MED ORDER — ASPIRIN 81 MG PO CHEW
81.0000 mg | CHEWABLE_TABLET | Freq: Every day | ORAL | Status: DC
  Administered 2012-04-26 – 2012-04-27 (×2): 81 mg via NASOGASTRIC
  Filled 2012-04-26 (×3): qty 1

## 2012-04-26 NOTE — Progress Notes (Addendum)
STOY STROUSE [id number] September 07, 1960  CARE TEAM:  PCP: No primary provider on file.  Outpatient Care Team: Patient has no care team.  Inpatient Treatment Team: Treatment Team: Attending Provider: Calvert Cantor, MD; Rounding Team: Sheppard Penton, MD; Consulting Physician: Md Montez Morita, MD; Speech Language Pathologist: Riley Nearing Deblois, CCC-SLP   Subjective:  Perc drainage done - c/w ascites Moved to SD unit Pt resting in bed Wife/family in room Wound vac applied  Objective:  Vital signs:  Filed Vitals:   04/26/12 0503 04/26/12 0745 04/26/12 0800 04/26/12 0900  BP: 96/56 111/60 97/52 99/56   Pulse: 111 107 109 109  Temp: 98.5 F (36.9 C) 98 F (36.7 C)    TempSrc: Axillary Oral    Resp: 16 15 13 13   Height:      Weight: 142 lb 12.8 oz (64.774 kg)     SpO2: 98% 97% 98% 99%    Last BM Date: 04/25/12  Intake/Output   Yesterday:  04/02 0701 - 04/03 0700 In: 1190 [I.V.:1050; NG/GT:140] Out: 850 [Urine:850] This shift:  Total I/O In: 170 [I.V.:150; NG/GT:20] Out: -   Bowel function:  Flatus: y  BM: y, thick in bag  Drain: n/a  Physical Exam:  General: Pt sleeping but awakens/alert/oriented x4 in no acute distress.  Cachectic Eyes: PERRL, normal EOM.  Sclera clear.  No icterus Neuro: CN II-XII intact w/o focal sensory/motor deficits. Lymph: No head/neck/groin lymphadenopathy Psych:  No delerium/psychosis/paranoia.  Withdrawn HENT: Normocephalic, Mucus membranes moist.  No thrush Neck: Supple, No tracheal deviation Chest: No chest wall pain w good excursion CV:  Pulses intact.  Regular rhythm MS: Normal AROM mjr joints.  No obvious deformity Abdomen: Soft.  Nondistended.  Nontender at incisions only.  No evidence of peritonitis.  No incarcerated hernias.  Wound vac in place.  ileostomy pnk Ext:  SCDs BLE.  No mjr edema.  No cyanosis Skin: No petechiae / purpura  Results:   Labs: Results for orders placed during the hospital encounter of 04/25/12 (from the  past 48 hour(s))  GLUCOSE, CAPILLARY     Status: None   Collection Time    04/25/12  5:11 PM      Result Value Range   Glucose-Capillary 70  70 - 99 mg/dL   Comment 1 Notify RN    GLUCOSE, CAPILLARY     Status: None   Collection Time    04/25/12 10:37 PM      Result Value Range   Glucose-Capillary 74  70 - 99 mg/dL   Comment 1 Notify RN    CBC     Status: Abnormal   Collection Time    04/26/12  6:00 AM      Result Value Range   WBC 15.6 (*) 4.0 - 10.5 K/uL   RBC 2.56 (*) 4.22 - 5.81 MIL/uL   Hemoglobin 7.8 (*) 13.0 - 17.0 g/dL   HCT 40.9 (*) 81.1 - 91.4 %   MCV 91.8  78.0 - 100.0 fL   MCH 30.5  26.0 - 34.0 pg   MCHC 33.2  30.0 - 36.0 g/dL   RDW 78.2 (*) 95.6 - 21.3 %   Platelets 364  150 - 400 K/uL  COMPREHENSIVE METABOLIC PANEL     Status: Abnormal   Collection Time    04/26/12  6:00 AM      Result Value Range   Sodium 140  135 - 145 mEq/L   Potassium 3.3 (*) 3.5 - 5.1 mEq/L   Chloride 112  96 -  112 mEq/L   CO2 16 (*) 19 - 32 mEq/L   Glucose, Bld 74  70 - 99 mg/dL   BUN 22  6 - 23 mg/dL   Creatinine, Ser 1.61  0.50 - 1.35 mg/dL   Calcium 7.8 (*) 8.4 - 10.5 mg/dL   Total Protein 4.8 (*) 6.0 - 8.3 g/dL   Albumin 1.2 (*) 3.5 - 5.2 g/dL   AST 27  0 - 37 U/L   ALT 23  0 - 53 U/L   Alkaline Phosphatase 296 (*) 39 - 117 U/L   Total Bilirubin 1.0  0.3 - 1.2 mg/dL   GFR calc non Af Amer 59 (*) >90 mL/min   GFR calc Af Amer 68 (*) >90 mL/min   Comment:            The eGFR has been calculated     using the CKD EPI equation.     This calculation has not been     validated in all clinical     situations.     eGFR's persistently     <90 mL/min signify     possible Chronic Kidney Disease.  GLUCOSE, CAPILLARY     Status: None   Collection Time    04/26/12  7:41 AM      Result Value Range   Glucose-Capillary 85  70 - 99 mg/dL   Comment 1 Documented in Chart      Imaging / Studies: Ct Abdomen Pelvis Wo Contrast  04/24/2012  *RADIOLOGY REPORT*  Clinical Data: Abdominal  pain and distension.  CT ABDOMEN AND PELVIS WITHOUT CONTRAST  Technique:  Multidetector CT imaging of the abdomen and pelvis was performed following the standard protocol without intravenous contrast.  Comparison: 03/11/2012  Findings: Bilateral pleural effusions, greater on the left, with bilateral infiltration or atelectasis in the lung bases.  This is slightly progressive since previous study.  Diffuse upper abdominal free fluid.  This is progressing since previous study.  Hounsfield unit measurements are somewhat increased, suggesting possible hemorrhage versus complex fluid. Fluid extends down the pericolic gutters and anteriorly in the pelvis. There appears to be loculated fluid deforming the spleen which may represent subcapsular fluid or hematoma.  The liver, gallbladder pancreas, adrenal glands, and retroperitoneal lymph nodes are unremarkable.  Calcification of the aorta without aneurysm.  Flattened inferior vena cava may represent hypovolemia. Tiny punctate stone in the lower pole of the right kidney without obstruction.  No hydronephrosis in either kidney.  No free air in the abdomen.  Postoperative changes with colectomy and right lower quadrant ileostomy.  Small bowel are not distended and contrast material flows to the ileostomy suggesting no evidence of obstruction.  There is an NG tube in place in the stomach is decompressed.  Midline incisional defect in the soft tissues.  Pelvis:  Extensive ascites extending from above.  Bladder wall is not thickened.  Prominent sized prostate gland, measuring about 4.7 x 3.3 cm.  No focal inflammatory no significant pelvic lymphadenopathy.  Mild degenerative changes in the spine and hips.  IMPRESSION: Increasing free fluid in the abdomen and pelvis.  Hounsfield unit measurements are somewhat increased, suggesting possible hemorrhage or complex fluid.  Loculated fluid in the left upper quadrant deforms the spleen and could represent a subcapsular fluid collection  or hematoma.  Increasing bilateral pleural effusions with basilar atelectasis.  No evidence of obstruction.   Original Report Authenticated By: Burman Nieves, M.D.    Dg Abd 1 View  04/24/2012  *RADIOLOGY REPORT*  Clinical Data: Abdominal pain and distension, NG tube placement  ABDOMEN - 1 VIEW  Comparison: 04/23/2012  Findings: NG tube projects over the pyloric region of the stomach. There are no abnormally dilated loops of bowel.  IMPRESSION: Nonobstructive gas pattern is similar to prior study   Original Report Authenticated By: Esperanza Heir, M.D.    US Paracentesis  04/25/2012  *RADIOLOGY REPORT*  Clinical Data: Patient status post decompressive cecostomy/ileostomy for toxic megacolon/c diff, renal insufficiency, ascites.  Request is made for diagnostic and therapeutic paracentesis.  ULTRASOUND GUIDED DIAGNOSTIC AND THERAPEUTIC  PARACENTESIS  An ultrasound guided paracentesis was thoroughly discussed with the patient and questions answered.  The benefits, risks, alternatives and complications were also discussed.  The patient understands and wishes to proceed with the procedure.  Written consent was obtained.  Ultrasound was performed to localize and mark an adequate pocket of fluid in the left lower quadrant of the abdomen.  The area was then prepped and draped in the normal sterile fashion.  1% Lidocaine was used for local anesthesia.  Under ultrasound guidance a 19 gauge Yueh catheter was introduced.  Paracentesis was performed.  The catheter was removed and a dressing applied.  Complications:  none  Findings:  A total of approximately 2.9 liters of clear,yellow fluid was removed.  A fluid sample was sent for culture.  IMPRESSION: Successful ultrasound guided diagnostic and therapeutic paracentesis yielding 2.9 liters of ascites.  Read by: Jeananne Rama, P.A.-C   Original Report Authenticated By: Judie Petit. Miles Costain, M.D.     Medications / Allergies: per chart  Antibiotics: Anti-infectives   Start      Dose/Rate Route Frequency Ordered Stop   04/25/12 2200  aztreonam (AZACTAM) 1 g in dextrose 5 % 50 mL IVPB  Status:  Discontinued     1 g 100 mL/hr over 30 Minutes Intravenous 3 times per day 04/25/12 1625 04/25/12 1636      Problem List:   Active Problems:   HAP (hospital-acquired pneumonia)   Pleural effusion, L > R   PAF (paroxysmal atrial fibrillation)   Anemia of chronic disease   Quadriparesis   S/p total colectomy for toxic megacolon, C diff   Severe muscle deconditioning   Critical illness myopathy and neuropathy   Protein-calorie malnutrition, severe   Spleen hematoma - subcapsular & without rupture    Aspiration pneumonitis   Intra-abdominal fluid collection/LUQ   CKD (chronic kidney disease) stage 2, GFR 60-89 ml/min   Acute renal failure   Hypokalemia   Assessment  [name]  52 y.o. male       Failure to thrive with severe malnutrition  No evidence of intraabdominal abscess/infection  Plan:  If persists or positive blood per ileostomy, consider gastroenterology evaluation with EGD.   With ileostomy, at risk for dehydration. We will give IV fluids and re-evaluate.   Albumin also.  Loperamide QHS for ileostomy regimen to avoid massive GI losses  WOCN ostomy nurses help appreciated.  Continue wound vac for wound to help with granulation/healing.  CT scan shows ascites & persistent subcapsular hematoma of spleen.  Leave spleen alone.  S/p tap peritoneal fluid to r/o infection but most likely a reflection of his SEVERE protein calorie malnutrition.  Severe malnutrition with albumin <2 - this is the major stumbling block on his road to recovery.   Hopefully that can be turned around. Start tube feeds via NGT.  If tolerates, switch to corpak or PEG by Int Rad.   I think at this  point, he would benefit from a PEG.  Already had a Corpak for a while.  Would be more likely getting his enteral medications and easy to feed bolus him, weaning off & remove the PEG at a  later time when he is better.  More options for placement with patent than with a chronic Corpak.  If not tolerating enteral route, start TNA.   Supp shakes as tolerated. Calorie counts.   -VTE prophylaxis- SCDs, etc  -mobilize as tolerated to help recovery  Ardeth Sportsman, M.D., F.A.C.S. Gastrointestinal and Minimally Invasive Surgery [facility] [address] [place], [place] [address] [phone] Main / Paging   04/26/2012

## 2012-04-26 NOTE — Progress Notes (Signed)
[facility] Progress Note [place] [name]   ORLANDO MUSTON XNA:[id number] DOB: [birth year] DOA: 04/25/2012 PCP: No primary provider on file.  Brief narrative: 52 year old male patient with past medical history of ulcerative colitis and prior diverticular perforation. He was admitted to [facility] on 02/26/2012 with C. difficile colitis. He initially underwent exploratory laparotomy for a cecostomy tube placement by Dr. Dian Situ on 02/27/2012 because of colonic stricture. 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 Because of septic shock with acute renal failure and Atrial fib with RVR he was transferred to [facility] 03/01/2012. He required acute dialysis/CVVHD and was followed by pulmonary critical care medicine for his sepsis and shock that required pressors. CCS was consulted in regards to the surgical problems. He developed wound evisceration and underwent multiple procedures with eventual closure and placement of retention sutures on 04/01/2012 by Dr. Johna Sheriff. 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  There was also question of heparin induced thrombocytopenia. He was transitioned to Argatroban and developed a GI bleed on 04/02/2012. He had persistent lower extremity edema but Dopplers were negative for DVT. He has a combination of anemia of critical illness as well as acute blood loss anemia and has received multiple packed red blood cells this admission with most recent hemoglobin of >7.0. Because of diffuse weakness, especially right upper extremity weakness, neurology was consulted. 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 Dr. Pearlean Brownie recommended aspirin initially with  transition to anticoagulation once thrombocytopenia and transaminitis resolved. Because of patient's difficulty swallowing and tolerating enteral feeds initiation of oral medications including anticoagulation has not been limited. His feeding tube was initially discontinued on 04/16/2012 and his diet was advanced to dysphagia 3. 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 PT and OT recommended patient proceed with inpatient rehabilitation and he was admitted to that unit on 04/17/2012.  After admission to the rehabilitation unit 04/21/2012 the patient developed nausea & vomiting and chest x-ray revealed a right lower lobe pneumonia likely related to aspiration. He also had associated leukocytosis. Critical care medicine was reconsult at and they recommended beginning vancomycin Azactam for possible healthcare associated pneumonia.  Unfortunately despite attempts to allow for oral intake he has had significant anorexia and poor intake. Surgery has continued to follow patient and because of anorexia and vomiting there was a suspicion for an ileus. CT scan of the abdomen revealed a loculated left upper quadrant fluid collection. Subsequently Dr. Michaell Cowing with the surgical team debrided the abdominal wound and removed the bolsters and retention sutures on 04/24/2012. A nasogastric tube was replaced on 04/24/2012 as well without any improvement in his GI symptoms despite being to intermittent suction and draining bile. Unfortunately his white count continued to rise (now 15,600) despite having recently been on antibiotics. Because of multiple medical problems that would interfere with successful pursuit of rehabilitative therapy the patient was transferred to the acute care hospital 04/25/2012. Team 1 assumed care of the patient on 04/26/2012.  Assessment/Plan: Active Problems:   Pleural effusion, L > R/HAP (hospital-acquired  pneumonia) vs Aspiration pneumonitis -appreciate PCCM assistance -anbx's dc/d 4/2 since completed 5 days of tx -NOT hypoxic so this is encouraging -suspect severe PCM contributing to  fluid accumulations including pleural effusions    Acute renal failure on CKD (chronic kidney disease) stage 2, GFR 60-89 ml/min -Scr stable with great UOP      Anemia of chronic disease & of critical illness -hgb 7.8 which is stable although baseline around 9.5 -? 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 need to start ASA per previous Neuro recs -once tolerates enteral feeds/PO intake and thrombocytopenia resolves (now resolved) will need to start Plavix    Hypokalemia -replete prn    PAF (paroxysmal atrial fibrillation) -resolved    Protein-calorie malnutrition, severe/Dehydration/Ileus -high risk for recurrent DH due to ileostomy- cont IVF -cont imodium to decrease ileo output -Malnutrition major factor contributing to slow recovery/wound healing incluiding recurrent fluid collections/he is post aspiration ascites 4/2 -if does not tolerate TF the CCS recs TNA    Spleen hematoma - subcapsular & without rupture    Intra-abdominal fluid collection/LUQ -CCS nothing to do for spleen/self limiting  Recent hyperglycemia -cont  SSI -check HgbA1c- no prior h/o diabetes   DVT prophylaxis: SCDs Code Status: Full Family Communication: Patient and wife at bedside Disposition Plan: Stepdown Isolation: None  Consultants: CCS Pulmonary medicine  Procedures: 2/03- Flex sig 2/03 - Decompressive Cecostomy due to colonic stricture and megacolon 2/04 - Total abdominal colectomy and end ileostomy for toxic megacolon 2/26-Cystoscpy w/ BRP 3/03-EL/Liver bx/appliation wound VAC 3/06-EL/open abd wound VAC change 3/09-removal VAC dressing and abdominal closure  CULTURES:  3/29 BC x 2>>>   Antibiotics: PCN, Imipenem ALLERGIC - rash S/p full treatnent x 3 weeks for cdiff  Aztreonam 3/29>>>  vanc 3/29>>> 3/31   HPI/Subjective: Patient alert but very weak but able to verbally communicate. Most of the history obtained from the wife because of this. A specific complaints.   Objective: Blood pressure 106/61, pulse 109, temperature 98.3 F (36.8 C), temperature source Axillary, resp. rate 17, height 5\' 9"  (1.753 m), weight 64.774 kg (142 lb 12.8 oz), SpO2 100.00%.  Intake/Output Summary (Last 24 hours) at 04/26/12 1324 Last data filed at 04/26/12 1300  Gross per 24 hour  Intake   1760 ml  Output    850 ml  Net    910 ml     Exam: General: No acute respiratory distress Lungs: Clear to auscultation bilaterally without wheezes or crackles, RA Cardiovascular: Regular rate and rhythm without murmur gallop or rub normal S1 and S2, no peripheral edema or JVD; blood pressure soft with systolic readings 90 to, IV fluids at 75 cc per hour Abdomen: Nontender, nondistended, soft, hypoactive bowel sounds positive, no rebound, no ascites, no appreciable mass; ileostomy stoma stable without evidence of bleeding-small amount of brown liquid stool; nasogastric tube in place with trickle tube feedings at 10 cc per hour; wound VAC over midline abdominal incision Musculoskeletal: No significant cyanosis, clubbing of bilateral lower  extremities Neurological: Alert and oriented x 3, moves all extremities x 4 but  extremely weak with strength between 2-3/5 except for right upper extremity which has 1/5 strength is suspected limitations are due to known shoulder injury, CN 2-13 intact  Data Reviewed: Basic Metabolic Panel:  Recent Labs Lab 04/21/12 0840 04/23/12 0715 04/24/12 1441 04/25/12 0005 04/26/12 0600  NA 148* 137 137 136 140  K 3.8 3.9 3.9 3.6 3.3*  CL 117* 108 106 107 112  CO2 20 19 19 20  16*  GLUCOSE 113* 95 77 77 74  BUN 32* 31* 28* 25* 22  CREATININE 1.61* 1.67* 1.61* 1.54* 1.34  CALCIUM 8.5 8.1* 8.1* 8.1* 7.8*  MG  --   --   --  1.6  --   PHOS  --   --   --  3.7  --    Liver Function Tests:  Recent Labs Lab 04/21/12 0840 04/24/12 1441 04/25/12 0005 04/26/12 0600  AST 19 30 37 27  ALT 24 27 28 23   ALKPHOS 297* 291* 294* 296*  BILITOT 1.5* 1.3* 1.2 1.0  PROT 5.6* 5.2* 5.0* 4.8*  ALBUMIN 1.6* 1.4* 1.4* 1.2*   No results found for this basename: LIPASE, AMYLASE,  in the last 168 hours No results found for this basename: AMMONIA,  in the last 168 hours CBC:  Recent Labs Lab 04/21/12 0840 04/23/12 0715 04/24/12 1441 04/25/12 0005 04/26/12 0600  WBC 14.7* 13.4* 15.6* 16.7* 15.6*  NEUTROABS 13.1*  --   --  13.8*  --   HGB 7.8* 7.3* 8.2* 8.0* 7.8*  HCT 24.7* 21.9* 24.4* 23.9* 23.5*  MCV 98.0 93.6 92.1 91.9 91.8  PLT 350 341 384 403* 364   Cardiac Enzymes: No results found for this basename: CKTOTAL, CKMB, CKMBINDEX, TROPONINI,  in the last 168 hours BNP (last 3 results)  Recent Labs  04/10/12 0650  PROBNP 239.6*   CBG:  Recent Labs Lab 04/25/12 1123 04/25/12 1711 04/25/12 2237 04/26/12 0741 04/26/12 1213  GLUCAP 78 70 74 85 79    Recent Results (from the past 240 hour(s))  CULTURE, BLOOD (ROUTINE X 2)     Status: None   Collection Time    04/21/12  8:40 AM      Result Value Range Status   Specimen Description BLOOD LEFT ARM   Final   Special Requests BOTTLES  DRAWN AEROBIC AND ANAEROBIC 10CC   Final   Culture  Setup Time 04/21/2012 18:02   Final   Culture     Final   Value:        BLOOD CULTURE RECEIVED NO GROWTH TO DATE CULTURE WILL BE HELD FOR 5 DAYS BEFORE ISSUING A FINAL NEGATIVE REPORT   Report Status PENDING   Incomplete  CULTURE, BLOOD (ROUTINE X 2)     Status: None   Collection Time    04/21/12  8:50 AM      Result Value Range Status   Specimen Description BLOOD LEFT ARM   Final   Special Requests BOTTLES DRAWN AEROBIC ONLY 10CC   Final   Culture  Setup Time 04/21/2012 18:02   Final   Culture     Final   Value:        BLOOD CULTURE RECEIVED NO GROWTH TO DATE CULTURE WILL BE HELD FOR 5 DAYS BEFORE ISSUING A FINAL NEGATIVE REPORT   Report Status PENDING   Incomplete  BODY FLUID CULTURE     Status: None   Collection Time    04/25/12 12:25 PM      Result Value Range Status  Specimen Description FLUID PERITONEAL   Final   Special Requests NONE   Final   Gram Stain     Final   Value: NO WBC SEEN     NO ORGANISMS SEEN   Culture NO GROWTH 1 DAY   Final   Report Status PENDING   Incomplete  CULTURE, FUNGUS WITHOUT SMEAR     Status: None   Collection Time    04/25/12 12:26 PM      Result Value Range Status   Specimen Description FLUID PERITONEAL   Final   Special Requests FLUID   Final   Culture CULTURE IN PROGRESS FOR FOUR WEEKS   Final   Report Status PENDING   Incomplete     Studies:  Recent x-ray studies have been reviewed in detail by the Attending Physician  Scheduled Meds:  Reviewed in detail by the Attending Physician   Junious Silk, ANP [facility] Office  [phone] Pager [phone]  On-Call/Text Page:      [email or web address]      password [email or web address]  If 7PM-7AM, please contact night-coverage [email or web address] Password [email or web address] 04/26/2012, 1:24 PM   LOS: 1 day   I have examined the patient, reviewed the chart and modified the above note which I agree with.   Doralene Glanz,MD [phone] 04/26/2012, 6:09 PM

## 2012-04-26 NOTE — Consult Note (Signed)
Name: Connor Small   MRN: [id number]  DOB: Jun 09, 1960   ADMISSION DATE: 04/17/2012  CONSULTATION DATE: 3/29  REFERRING MD : Dr Connor Small    CHIEF COMPLAINT: Aspiration PNA?   BRIEF PATIENT DESCRIPTION:  59 YOM admitted to [facility] for Cdiff on 2/2. Underwent decompressive cecostomy 2/3, Hospital course complicated by ARDS, PNA, septic shock, renal failure, wound dehiscence and required multiple OR visits. To [facility] rehab 3/25.  Concern for aspiration event 3/29 with nausea / vomiting, PCCM consulted.  4/2 patient transferred back to inpatient care due to noted fluid collection in abdomen, deconditioning.    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 Overlying bowel gas and patientclinical status. No explanation for elevated liver function tests.  Similar to slight increase in size of a perisplenic fluidcollection.  Right pleural effusion  2/20 - reduction pressors, improved alertness  2/21 - bleeding overnight from JP's, argatroban turned off, lower pressor needs  2/22- off pressors (yeah), on RA (yeah)  2/23 - neg from JPs again, inr reducing  2/24 - concern dehiscence , some improved renal fxn  with increased volume  2/24 - possible dye change in output after dye 1 hr  2/25 - elative brady  2/25 - cysto neg injury  3/25 - to CIR for rehab 3/28 Called back to see on rehab for ? Asp  4/01- continued nausea 4/02 - CT abd with pocket of fluid collection, drained per IR, tx back to inpatient care due to profound deconditioning, n/v, fluid collection 4/3- limited Pt involvement, back to 2600  CULTURES:  3/29 BC x 2>>>    ANTIBIOTICS: PCN, Imipenem ALLERGIC - rash S/p full tratemtn 3 weeks cdiff   Aztreonam 3/29>>> 4/3 vanc 3/29>>> 3/31  Subjective: Tf to start  Filed Vitals:   04/26/12 1200 04/26/12 1300 04/26/12 1400 04/26/12 1520  BP: 92/56 97/63 92/58  94/58  Pulse: 104 110 107 116  Temp:    98.3 F (36.8 C)  TempSrc:    Axillary  Resp: 12 19 14 14   Height:      Weight:      SpO2: 99% 99% 98% 99%    Recent Labs Lab 04/24/12 1441 04/25/12 0005 04/26/12 0600  HGB 8.2* 8.0* 7.8*  HCT 24.4* 23.9* 23.5*  WBC 15.6* 16.7* 15.6*  PLT 384 403* 364    Recent Labs Lab 04/21/12 0840 04/23/12 0715 04/24/12 1441 04/25/12 0005 04/26/12 0600  NA 148* 137 137 136 140  K 3.8 3.9 3.9 3.6 3.3*  CL 117* 108 106 107 112  CO2 20 19 19 20  16*  GLUCOSE 113* 95 77  77 74  BUN 32* 31* 28* 25* 22  CREATININE 1.61* 1.67* 1.61* 1.54* 1.34  CALCIUM 8.5 8.1* 8.1* 8.1* 7.8*  MG  --   --   --  1.6  --   PHOS  --   --   --  3.7  --    CXR 4/2 - Moderate left pleural effusion. Mild right basilar opacity concerning for pneumonia or subsegmental atelectasis - improved  ASSESSMENT / PLAN:   PULMONARY  A:  Lt pleural effusion, thora 6 weeks ago R Linear Airspace Disease - 3/29 c/w asp vs HCAP   P:  -IS  -Increase activity, upright -abx off -aspiration precautions / upright positioning On Ra, no need pcxr follow up unless clinically declines -see ID  INFECTIOUS  A:  C diff colitis, resolved clinically  ASP PNA vs HCAP 3/29  - Remains culture neg  P: -short course  abx completed, follow clinical course He is clearing secretions well  ATN / ARF/ Hypovolemia - resolving  P: -Chem PRN -Follow ostomy output closely, allow neg balance on own if crt continues to resolve -strict I/O's -If low Bp, has responded to albumin in past well  Nausea  -resolved P: -restart ASA today, re assess [place] score for further anticoagulation needs -AVOID TPN if able, would prefer PEG over tpn  Goal would be to start TF today and increase slowly intake orals wit NGT in Agree with CCs, may need PEG, consider needs eval this early next week  May need SSRI  WILL SIGN OFF, Feel free to call with any questions. Our service know MR Connor Small and Family well  Connor Small. Connor Alias, MD, FACP Pgr: [phone] [facility]

## 2012-04-26 NOTE — Progress Notes (Signed)
Pt family requesting [place] nurse to apply first wound vac dressing due to previous complication. Placed a WOC consult for pt to be assessed for wound vac. Notified K.Kirby. No new orders received.

## 2012-04-26 NOTE — Progress Notes (Signed)
INITIAL NUTRITION ASSESSMENT  DOCUMENTATION CODES Per approved criteria  -Severe malnutrition in the context of acute illness or injury   INTERVENTION: 1. MD: Monitor magnesium, potassium, and phosphorus daily for at least 3 days and throughout advancement of nutrition support, MD to replete as needed, as pt is at risk for refeeding syndrome given ongoing severe malnutrition. 2. Once medically appropriate, recommend advancement of Vital AF 1.2 by 10 ml/hr, every 12 hours (pt requires slow advancement 2/2 high refeeding risk), or as tolerated to goal of 75 ml/hr. Goal regimen will provide 2160 kcal, 135 grams protein, 1459 ml free water. 3. Diet resumption per team 4. RD to continue to follow nutrition care plan  NUTRITION DIAGNOSIS: Inadequate oral intake related to poor appetite as evidenced by ongoing weight loss and suboptimal po intake.   Goal: Intake to meet >90% of estimated nutrition needs.  Monitor:  weight trends, lab trends, I/O's, PO intake, supplement tolerance  Reason for Assessment: Use of Enteral Nutrition  52 y.o. male  Admitting Dx: possible aspiration PNA  ASSESSMENT: Hx of ulcerative colitis and diverticular perforation. Admitted to [facility] on 2/2 with multiple pseudomembranes consistent with C. difficile colitis and low colorectal stricture. Underwent exploratory laparotomy with cecostomy tube placement 2/3. 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 Post-op, developed ARDS with septic shock and renal failure. 2/6 transferred to [facility]. CRRT initiated. Tx to CIR 3/25. Return to acute 2/2 possible aspiration PNA on 4/2.  Pt followed by RD staff during acute and inpatient rehab hospitalization. Pt with variable wt since admission. Admission wt 200 lbs, wife reports that this is patient's baseline weight.   Hospital course nutrition summary:  2/2 - 2/20: permissively underfed with EN   2/21-2/26: advanced to full feeds  2/26 - 3/3: feeds held for surgery  3/4 - 3/11: resumed full feeds  3/11 - 3/12: feeds held for GI bleed  3/13 - 3/24: received full feeds  3/24: TFs discontinued diet initiated; pt with very poor oral intake; tx to rehab 3/25  3/28 - 4/1: increased n/v - pt refusing almost all meals and supplements; wife declined calorie count, requesting TPN  4/2: NGT placed to suction, surgery ordering TF initiation; tx back to acute with plans for trickle feedings  Pt continues to meet criteria for severe malnutrition of acute illness given 29% wt loss in >1 month and PO/nutrition support meeting </=75% of estimated needs for >1 month.  Per most recent refeeding labs, magnesium and phosphorus WNL. Potassium low. Prealbumin 9.6 (low).  Pt is currently receiving Vital AF 1.2 via NGT. RN confirms that pt is tolerating well at this time. Per RN, pt to initiate diet tomorrow. Remains NPO at this time.   Height: Ht Readings from Last 1 Encounters:  04/25/12 5' 9"  (1.753 m)    Weight: Wt Readings from Last 1 Encounters:  04/26/12 142 lb 12.8 oz (64.774 kg)    Ideal Body Weight: 160 lb  % Ideal Body Weight: 89%  Wt Readings from Last 10 Encounters:  04/26/12 142 lb 12.8 oz (64.774 kg)  04/18/12 158 lb 9.6 oz (71.94 kg)  04/17/12 168 lb 14 oz (76.6 kg)  04/17/12 168 lb 14 oz (76.6 kg)  04/17/12 168 lb 14 oz (76.6 kg)  04/17/12 168 lb 14 oz (76.6 kg)  04/17/12 168 lb 14 oz (76.6 kg)  04/17/12 168 lb 14 oz (76.6 kg)  04/17/12 168 lb 14 oz (76.6 kg)  04/17/12 168  lb 14 oz (76.6 kg)    Usual Body Weight: 200 lb (per wife prior to hospitalization)  % Usual Body Weight: 71%  BMI:  Body mass index is 21.08 kg/(m^2). WNL  Estimated Nutritional Needs: Kcal: 2200 - 2400 Protein: 120 - 140 grams Fluid: at least 2.3 liters  Skin: stage II R arm, stage I R sacrum  Diet Order:   none (Dysphagia 3 diet to start tomorrow)  EDUCATION NEEDS: -No education needs  identified at this time   Intake/Output Summary (Last 24 hours) at 04/26/12 0820 Last data filed at 04/26/12 0700  Gross per 24 hour  Intake   1190 ml  Output    850 ml  Net    340 ml    Last BM: 4/2  Labs:   Recent Labs Lab 04/24/12 1441 04/25/12 0005 04/26/12 0600  NA 137 136 140  K 3.9 3.6 3.3*  CL 106 107 112  CO2 19 20 16*  BUN 28* 25* 22  CREATININE 1.61* 1.54* 1.34  CALCIUM 8.1* 8.1* 7.8*  MG  --  1.6  --   PHOS  --  3.7  --   GLUCOSE 77 77 74    CBG (last 3)   Recent Labs  04/25/12 1711 04/25/12 2237 04/26/12 0741  GLUCAP 70 74 85    Prealbumin  Date/Time Value Range Status  04/25/2012 12:05 AM 9.6* 17.0 - 34.0 mg/dL Final     Scheduled Meds: . chlorhexidine  15 mL Mouth Rinse BID  . feeding supplement (VITAL AF 1.2 CAL)  1,000 mL Per Tube Q24H  . ferrous sulfate  300 mg Per Tube BID WC  . insulin aspart  0-5 Units Subcutaneous QHS  . insulin aspart  0-9 Units Subcutaneous TID WC  . lidocaine  1 patch Transdermal Q24H  . lip balm   Topical BID  . loperamide  2 mg Oral QHS  . pantoprazole sodium  40 mg Per Tube Q1200  . prochlorperazine  10 mg Intravenous Q6H  . saccharomyces boulardii  250 mg Oral BID    Continuous Infusions: . sodium chloride 1,000 mL (04/25/12 2311)    Past Medical History  Diagnosis Date  . Chronic diarrhea   . Rectal bleed   . Hemorrhoids   . Ulcerative colitis     Distal UC over 8 yrs ago diagnosed  . Diverticulitis of large intestine with perforation 10/2011    done at [place]  . 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 history of UC (ulcerative colitis) 12/06/2010    Past Surgical History  Procedure Laterality Date  . Colonoscopy  9/03  . Sigmoidoscopy       06/13/2002  . Temporary ostomy November 06, 2010      for a colon perforation that was done in [facility] (Dr Payton Doughty).    . Colonoscopy  02/16/2011    Procedure: COLONOSCOPY;  Surgeon: Rogene Houston, MD;  [facility];  Service: Endoscopy;   Laterality: N/A;  100  . Flexible sigmoidoscopy  02/27/2012    Procedure: FLEXIBLE SIGMOIDOSCOPY;  Surgeon: Rogene Houston, MD;  [facility];  Service: Endoscopy;  Laterality: N/A;  with colonic decompression  . Laparotomy  02/27/2012    Procedure: EXPLORATORY LAPAROTOMY;  Surgeon: Donato Heinz, MD;  Location: [facility];  Service: General;  Laterality: N/A;  . Cecostomy  02/27/2012    Procedure: CECOSTOMY;  Surgeon: Donato Heinz, MD;  Location: [facility];  Service: General;  Laterality: N/A;  Cecostomy Tube Placement  . Colectomy  02/28/2012    Procedure: TOTAL COLECTOMY;  Surgeon: Donato Heinz, MD;  Location: [facility];  Service: General;  Laterality: N/A;  . Ileostomy  02/28/2012    Procedure: ILEOSTOMY;  Surgeon: Donato Heinz, MD;  Location: [facility];  Service: General;  Laterality: N/A;  . Cystoscopy w/ ureteral stent placement Bilateral 03/20/2012    Procedure: CYSTOSCOPY WITH RETROGRADE PYELOGRAM/URETERAL STENT PLACEMENT;  Surgeon: Alexis Frock, MD;  Location: [place];  Service: Urology;  Laterality: Bilateral;  . Laparotomy N/A 03/26/2012    Procedure: EXPLORATORY LAPAROTOMY;  Surgeon: Edward Jolly, MD;  Location: [place];  Service: General;  Laterality: N/A;  . Application of wound vac N/A 03/26/2012    Procedure: APPLICATION OF WOUND VAC;  Surgeon: Edward Jolly, MD;  Location: [facility];  Service: General;  Laterality: N/A;  . Liver biopsy N/A 03/26/2012    Procedure: LIVER BIOPSY;  Surgeon: Edward Jolly, MD;  Location: [place];  Service: General;  Laterality: N/A;  . Laparotomy N/A 03/29/2012    Procedure: EXPLORATORY LAPAROTOMY, PARTIAL WOUND CLOSURE;  Surgeon: Edward Jolly, MD;  Location: [place];  Service: General;  Laterality: N/A;  . Vacuum assisted closure change N/A 03/29/2012    Procedure: Open ABDOMINAL VACUUM  CHANGE;  Surgeon: Edward Jolly, MD;  Location: [place];  Service: General;  Laterality: N/A;  . Vacuum assisted closure change N/A 04/01/2012     Procedure: removal of abdominal vac dressing and abdominal closure;  Surgeon: Edward Jolly, MD;  Location: [place];  Service: General;  Laterality: N/A;    Inda Coke MS, RD, LDN Pager: [phone] After-hours pager: [phone]

## 2012-04-26 NOTE — Evaluation (Signed)
Clinical/Bedside Swallow Evaluation Patient Details  Name: Connor Small MRN: [id number] Date of Birth: 09-05-1960  Today's Date: 04/26/2012 Time: 4098-1191 SLP Time Calculation (min): 23 min  Past Medical History:  Past Medical History  Diagnosis Date  . Chronic diarrhea   . Rectal bleed   . Hemorrhoids   . Ulcerative colitis     Distal UC over 8 yrs ago diagnosed  . Diverticulitis of large intestine with perforation 10/2011    done at [place]  . 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 history of UC (ulcerative colitis) 12/06/2010   Past Surgical History:  Past Surgical History  Procedure Laterality Date  . Colonoscopy  9/03  . Sigmoidoscopy       06/13/2002  . Temporary ostomy November 06, 2010      for a colon perforation that was done in [facility] (Dr Ruben Im).    . Colonoscopy  02/16/2011    Procedure: COLONOSCOPY;  Surgeon: Malissa Hippo, MD;  [facility];  Service: Endoscopy;  Laterality: N/A;  100  . Flexible sigmoidoscopy  02/27/2012    Procedure: FLEXIBLE SIGMOIDOSCOPY;  Surgeon: Malissa Hippo, MD;  [facility];  Service: Endoscopy;  Laterality: N/A;  with colonic decompression  . Laparotomy  02/27/2012    Procedure: EXPLORATORY LAPAROTOMY;  Surgeon: Fabio Bering, MD;  [facility];  Service: General;  Laterality: N/A;  . Cecostomy  02/27/2012    Procedure: CECOSTOMY;  Surgeon: Fabio Bering, MD;  Location[facility];  Service: General;  Laterality: N/A;  Cecostomy Tube Placement  . Colectomy  02/28/2012    Procedure: TOTAL COLECTOMY;  Surgeon: Fabio Bering, MD;  [facility];  Service: General;  Laterality: N/A;  . Ileostomy  02/28/2012    Procedure: ILEOSTOMY;  Surgeon: Fabio Bering, MD;  Location[facility];  Service: General;  Laterality: N/A;  . Cystoscopy w/ ureteral stent placement Bilateral 03/20/2012    Procedure: CYSTOSCOPY WITH RETROGRADE PYELOGRAM/URETERAL STENT PLACEMENT;  Surgeon: Sebastian Ache, MD;  Location: [facility];  Service:  Urology;  Laterality: Bilateral;  . Laparotomy N/A 03/26/2012    Procedure: EXPLORATORY LAPAROTOMY;  Surgeon: Mariella Saa, MD;  [facility];  Service: General;  Laterality: N/A;  . Application of wound vac N/A 03/26/2012    Procedure: APPLICATION OF WOUND VAC;  Surgeon: Mariella Saa, MD;  [facility];  Service: General;  Laterality: N/A;  . Liver biopsy N/A 03/26/2012    Procedure: LIVER BIOPSY;  Surgeon: Mariella Saa, MD;  [facility];  Service: General;  Laterality: N/A;  . Laparotomy N/A 03/29/2012    Procedure: EXPLORATORY LAPAROTOMY, PARTIAL WOUND CLOSURE;  Surgeon: Mariella Saa, MD;  [facility];  Service: General;  Laterality: N/A;  . Vacuum assisted closure change N/A 03/29/2012    Procedure: Open ABDOMINAL VACUUM  CHANGE;  Surgeon: Mariella Saa, MD;  [facility];  Service: General;  Laterality: N/A;  . Vacuum assisted closure change N/A 04/01/2012    Procedure: removal of abdominal vac dressing and abdominal closure;  Surgeon: Mariella Saa, MD;  [facility];  Service: General;  Laterality: N/A;   HPI:  72 YOM admitted to [facility] for Cdiff on 2/2. Underwent decompressive cecostomy 2/3, initially was better, then worsened on 2/4 with increased abdominal distension and tenderness, decreased renal function.  He was taken back to the OR on 2/4 for total abdominal colectomy and end ileostomy.  Post op he developed ARDS  and sepsis shock and was being treated for this.  The morning of 2/6 he had new onset of A-fib and transferred to [facility].  Initial FEES showed severe, dry pharyngeal secretions. after 2-3 days of frequent oral care and ice chips, repeat FEES showed improved pharyngeal hygiene, DI/pudding stated with continue TF. Over the following week pt underent two more surgeries for abdominal wound with brief intubation x2 and then developed GI bleed. Today  Pt has had four FEES studies, with the last recommending  a dys 3 (mechanical soft)  diet and pudding thick liquids. Final objective test MBS 04/20/12 on recommended Dys 3 diet and thin liquids via teaspoon. Pt transferred to CIR consuming this diet but develeoped nausea and vomiting, RLL pna, and ascites returned to 2600 for management of medical issues. Pt is currently NPO except liquids with NG tube in place for feeding due to severe protein calorie malutrition. Pt with order in place from surgery to resume solid diet tomorrow. SLP with order to continue treatment during acute stay.    Assessment / Plan / Recommendation Clinical Impression  Evaluation revealed evidence of decompensation in swallow function since initiating Dys 3/thin diet with this SLP. Pt with consistent, immediate evidence of penetration/aspiration with teaspoon sips of liquids (cough throat clear and wet vocal quality). Given that tolerance of recommended diet was tenuous at best feel pt likely with increased aspiration at this time due to decompensation and presence of NG tube. Recommend pt continue a dys 3 (mechanical soft ) diet with pudding thick liquids until strength improves for repeat MBS in approx 3 days. Pt may have ice chips until that time. Strengthening exercises also completed this session.     Aspiration Risk  Moderate    Diet Recommendation Dysphagia 3 (Mechanical Soft);Pudding-thick liquid   Liquid Administration via: Spoon Medication Administration: Crushed with puree (small pills whole in puree) Supervision: Full supervision/cueing for compensatory strategies;Staff feed patient;Trained caregiver to feed patient Compensations: Slow rate;Small sips/bites;Multiple dry swallows after each bite/sip;Clear throat intermittently;Effortful swallow    Other  Recommendations Oral Care Recommendations: Oral care QID Other Recommendations: Have oral suction available   Follow Up Recommendations  Inpatient Rehab    Frequency and Duration min 3x week  2 weeks   Pertinent Vitals/Pain NA    SLP  Swallow Goals Patient will utilize recommended strategies during swallow to increase swallowing safety with: Minimal cueing   Swallow Study Prior Functional Status       General HPI: 54 YOM admitted to [facility] for Cdiff on 2/2. Underwent decompressive cecostomy 2/3, initially was better, then worsened on 2/4 with increased abdominal distension and tenderness, decreased renal function.  He was taken back to the OR on 2/4 for total abdominal colectomy and end ileostomy.  Post op he developed ARDS and sepsis shock and was being treated for this.  The morning of 2/6 he had new onset of A-fib and transferred to [facility]  Initial FEES showed severe, dry pharyngeal secretions. after 2-3 days of frequent oral care and ice chips, repeat FEES showed improved pharyngeal hygiene, DI/pudding stated with continue TF. Over the following week pt underent two more surgeries for abdominal wound with brief intubation x2 and then developed GI bleed. Today  Pt has had four FEES studies, with the last recommending  a dys 3 (mechanical soft) diet and pudding thick liquids. Final objective test MBS 04/20/12 on recommended Dys 3 diet and thin liquids via teaspoon. Pt transferred to CIR consuming this diet but develeoped  nausea and vomiting, RLL pna, and ascites returned to 2600 for management of medical issues. Pt is currently NPO except liquids with NG tube in place for feeding due to severe protein calorie malutrition. Pt with order in place from surgery to resume solid diet tomorrow. SLP with order to continue treatment during acute stay.  Type of Study: Bedside swallow evaluation Previous Swallow Assessment: FEES 03/20/12, 2/28, 3/12, 3/19; MBS 04/17/12 Diet Prior to this Study: Thin liquids Temperature Spikes Noted: No Respiratory Status: Room air History of Recent Intubation: Yes Length of Intubations (days): 16 days Date extubated: 03/16/12 Behavior/Cognition: Alert;Cooperative;Pleasant mood Oral Cavity - Dentition:  Adequate natural dentition Self-Feeding Abilities: Total assist Patient Positioning: Upright in bed Baseline Vocal Quality: Breathy;Low vocal intensity Volitional Cough: Weak Volitional Swallow: Able to elicit    Oral/Motor/Sensory Function Overall Oral Motor/Sensory Function: Appears within functional limits for tasks assessed   Ice Chips Ice chips: Not tested   Thin Liquid Thin Liquid: Impaired Presentation: Spoon Pharyngeal  Phase Impairments: Cough - Immediate;Throat Clearing - Immediate;Wet Vocal Quality    Nectar Thick Nectar Thick Liquid: Not tested   Honey Thick Honey Thick Liquid: Not tested   Puree Puree: Not tested   Solid   GO    Solid: Not tested      Connor Ditty, MA CCC-SLP [phone]  Connor Small 04/26/2012,2:54 PM

## 2012-04-26 NOTE — Consult Note (Signed)
Wound care follow-up: Requested to apply vac dressing to abd wound by CCS team.  Refer to 4/2 progress notes for assessment and measurements of 2 abd wounds.  Applied 2 pieces of black sponge, bridged together to 143m cont suction.  Pt tolerated without c/o pain.  Changed ileostomy pouch, stoma red and viable, flush with skin level and unchanged from yesterday. 100cc liquid brown stool in pouch. Supplies ordered to room for staff nurses; they can change abd vac Q Tues/Thurs/Sat.   DJulien GirtMSN, RN, [place]

## 2012-04-26 NOTE — Progress Notes (Signed)
I will follow pt's progress medically and with therapy to assist in determining final disposition as appropriate. I would recommend PT and OT evaluations to assist with mobilization and his severe deconditioning. [phone]

## 2012-04-26 NOTE — Progress Notes (Signed)
Agree with above See additional note Lavon Paganini. Titus Mould, MD, [place] Pgr: [place] [facility]

## 2012-04-27 LAB — BASIC METABOLIC PANEL
Chloride: 114 mEq/L — ABNORMAL HIGH (ref 96–112)
GFR calc Af Amer: 77 mL/min — ABNORMAL LOW (ref >90)
GFR calc non Af Amer: 66 mL/min — ABNORMAL LOW (ref >90)
Potassium: 3.1 mEq/L — ABNORMAL LOW (ref 3.5–5.1)
Sodium: 143 mEq/L (ref 135–145)

## 2012-04-27 LAB — GLUCOSE, CAPILLARY: Glucose-Capillary: 117 mg/dL — ABNORMAL HIGH (ref 70–99)

## 2012-04-27 LAB — COMPREHENSIVE METABOLIC PANEL
Albumin: 1.3 g/dL — ABNORMAL LOW (ref 3.5–5.2)
Alkaline Phosphatase: 295 U/L — ABNORMAL HIGH (ref 39–117)
BUN: 20 mg/dL (ref 6–23)
Creatinine, Ser: 1.16 mg/dL (ref 0.50–1.35)
GFR calc Af Amer: 81 mL/min — ABNORMAL LOW (ref >90)
Glucose, Bld: 128 mg/dL — ABNORMAL HIGH (ref 70–99)
Total Protein: 4.9 g/dL — ABNORMAL LOW (ref 6.0–8.3)

## 2012-04-27 LAB — CBC
HCT: 24 % — ABNORMAL LOW (ref 39.0–52.0)
MCHC: 32.1 g/dL (ref 30.0–36.0)
Platelets: 364 10*3/uL (ref 150–400)
RDW: 16.9 % — ABNORMAL HIGH (ref 11.5–15.5)
WBC: 16.3 10*3/uL — ABNORMAL HIGH (ref 4.0–10.5)

## 2012-04-27 LAB — CULTURE, BLOOD (ROUTINE X 2): Culture: NO GROWTH

## 2012-04-27 MED ORDER — FERROUS SULFATE 300 (60 FE) MG/5ML PO SYRP
300.0000 mg | ORAL_SOLUTION | Freq: Three times a day (TID) | ORAL | Status: DC
  Administered 2012-04-27 – 2012-05-09 (×30): 300 mg
  Filled 2012-04-27 (×38): qty 5

## 2012-04-27 MED ORDER — DIPHENHYDRAMINE HCL 12.5 MG/5ML PO ELIX
12.5000 mg | ORAL_SOLUTION | Freq: Four times a day (QID) | ORAL | Status: DC | PRN
  Filled 2012-04-27: qty 5

## 2012-04-27 MED ORDER — POTASSIUM CHLORIDE 20 MEQ/15ML (10%) PO LIQD
20.0000 meq | Freq: Two times a day (BID) | ORAL | Status: DC
  Administered 2012-04-27: 20 meq via ORAL
  Filled 2012-04-27 (×2): qty 15

## 2012-04-27 MED ORDER — ADULT MULTIVITAMIN LIQUID CH
5.0000 mL | Freq: Every day | ORAL | Status: DC
  Administered 2012-04-27 – 2012-05-09 (×11): 5 mL
  Filled 2012-04-27 (×14): qty 5

## 2012-04-27 MED ORDER — ASPIRIN 81 MG PO CHEW: 81.0000 mg | CHEWABLE_TABLET | Freq: Every day | ORAL | Status: DC

## 2012-04-27 MED ORDER — POTASSIUM CHLORIDE 20 MEQ/15ML (10%) PO LIQD
20.0000 meq | Freq: Two times a day (BID) | ORAL | Status: DC
  Administered 2012-04-27 – 2012-05-09 (×21): 20 meq via ORAL
  Filled 2012-04-27 (×26): qty 15

## 2012-04-27 MED ORDER — POTASSIUM CHLORIDE 20 MEQ/15ML (10%) PO LIQD: 20.0000 meq | Freq: Two times a day (BID) | ORAL | Status: DC

## 2012-04-27 MED ORDER — DARBEPOETIN ALFA-POLYSORBATE 40 MCG/0.4ML IJ SOLN
40.0000 ug | INTRAMUSCULAR | Status: DC
  Filled 2012-04-27 (×2): qty 0.4

## 2012-04-27 MED ORDER — VANCOMYCIN HCL IN DEXTROSE 1-5 GM/200ML-% IV SOLN
1000.0000 mg | INTRAVENOUS | Status: AC
Start: 2012-04-28 — End: 2012-04-28
  Administered 2012-04-28: 1000 mg via INTRAVENOUS
  Filled 2012-04-27 (×2): qty 200

## 2012-04-27 NOTE — Progress Notes (Signed)
NUTRITION FOLLOW UP  Intervention:   1. MD: Monitor magnesium, potassium, and phosphorus daily for at least 3 days and throughout advancement of nutrition support, MD to replete as needed, as pt is at risk for refeeding syndrome given ongoing severe malnutrition.  2. Once medically appropriate, recommend advancement of Vital AF 1.2 by 10 ml/hr, every 12 hours (pt requires slow advancement 2/2 high refeeding risk), or as tolerated to goal of 75 ml/hr. Goal regimen will provide 2160 kcal, 135 grams protein, 1459 ml free water.  3.  MVI, liquid per tube 4. RD to continue to follow nutrition care plan and for pt's progress with SLP.  Nutrition Dx:   Inadequate oral intake, ongoing.   Goal:  Intake to meet >90% of estimated nutrition needs.   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  Pt with acute respiratory and renal failure with ARDS and septic shock. Pt recently discharged to CIR, however currently admitted as inpatient due to possible aspiration PNA (4/2).  Hospital course nutrition summary:  2/2 - 2/20: permissively underfed with EN  2/21-2/26: advanced to full feeds  2/26 - 3/3: feeds held for surgery  3/4 - 3/11: resumed full feeds  3/11 - 3/12: feeds held for GI bleed  3/13 - 3/24: received full feeds  3/24: TFs discontinued diet initiated; pt with very poor oral intake; tx to rehab 3/25  3/28 - 4/1: increased n/v - pt refusing almost all meals and supplements; wife declined calorie count, requesting TPN  4/2: NGT placed to suction, surgery ordering TF initiation; tx back to acute with plans for trickle feedings. 4/3-4/4:  Trickle feeds of Vital 1.2 @ 30 mL/hr ongoing.   Pt continues to meet criteria for  severe malnutrition of acute illness given 29% wt loss in >1 month and PO/nutrition support meeting </=75% of estimated needs for >1 month.  Sodium  Date/Time Value Range Status  04/27/2012  5:00 AM 143  135 - 145 mEq/L Final  04/26/2012  6:00 AM 140  135 - 145 mEq/L Final  04/25/2012 12:05 AM 136  135 - 145 mEq/L Final    Potassium  Date/Time Value Range Status  04/27/2012  5:00 AM 3.1* 3.5 - 5.1 mEq/L Final  04/26/2012  6:00 AM 3.3* 3.5 - 5.1 mEq/L Final  04/25/2012 12:05 AM 3.6  3.5 - 5.1 mEq/L Final    Phosphorus  Date/Time Value Range Status  04/25/2012 12:05 AM 3.7  2.3 - 4.6 mg/dL Final  04/13/2012  4:10 AM 3.3  2.3 - 4.6 mg/dL Final  04/12/2012  4:35 AM 3.0  2.3 - 4.6 mg/dL Final    Magnesium  Date/Time Value Range Status  04/25/2012 12:05 AM 1.6  1.5 - 2.5 mg/dL Final  04/13/2012  4:10 AM 2.3  1.5 - 2.5 mg/dL Final  04/12/2012  4:35 AM 1.8  1.5 - 2.5 mg/dL Final   Discussed nutrition status and progress with wife at bedside.  Wife is currently in agreement with nutrition-related plan.  Pt is working with several therapists and physicians to improve overall medical condition.  He presently remains weak and is easily fatigued.  Wife reports tolerance of TF, although she also endorses fear of pt vomiting "like he did last time he had the tube."  RD  re-iterated benefits of enteral nutrition and discussed concerns for pt's current nutrition status.  Appreciate encouragement provided to wife from [place] and [place] MDs in promoting enteral nutrition in this patient.  RD notes SLP's note from today with pt requiring max encouragement and downgrade to pudding thick liquids.   Wife states she has been told pt will be re-evaluated for progress on Monday. Wife given objective ways we can measure nutrition-related progress and RD stated the potential need for ongoing nutrition support while pt's nutrition status improves.    Height: Ht Readings from Last 1 Encounters:  04/25/12 5' 9"  (1.753 m)    Weight  Status:   Wt Readings from Last 1 Encounters:  04/27/12 142 lb 12.8 oz (64.774 kg)    Re-estimated needs:  Kcal: 2200-2400 Protein: 120-140g Fluid: >2.2 L/day  Skin: incisions, ileostomy, otherwise intact  Diet Order: Dysphagia 3, pudding thick   Intake/Output Summary (Last 24 hours) at 04/27/12 1046 Last data filed at 04/27/12 0900  Gross per 24 hour  Intake   1140 ml  Output    650 ml  Net    490 ml    Last BM: 4/3   Labs:   Recent Labs Lab 04/25/12 0005 04/26/12 0600 04/27/12 0500  NA 136 140 143  K 3.6 3.3* 3.1*  CL 107 112 114*  CO2 20 16* 19  BUN 25* 22 19  CREATININE 1.54* 1.34 1.21  CALCIUM 8.1* 7.8* 8.0*  MG 1.6  --   --   PHOS 3.7  --   --   GLUCOSE 77 74 117*    CBG (last 3)   Recent Labs  04/26/12 1702 04/26/12 2124 04/27/12 0757  GLUCAP 89 96 117*    Scheduled Meds: . aspirin  81 mg Per NG tube Daily  . chlorhexidine  15 mL Mouth Rinse BID  . feeding supplement (VITAL AF 1.2 CAL)  1,000 mL Per Tube Q24H  . ferrous sulfate  300 mg Per Tube BID WC  . insulin aspart  0-5 Units Subcutaneous QHS  . insulin aspart  0-9 Units Subcutaneous TID WC  . lidocaine  1 patch Transdermal Q24H  . lip balm   Topical BID  . loperamide  2 mg Oral QHS  . pantoprazole sodium  40 mg Per Tube Q1200  . prochlorperazine  10 mg Intravenous Q6H  . saccharomyces boulardii  250 mg Oral BID    Continuous Infusions: . sodium chloride 500 mL (04/26/12 1640)    Brynda Greathouse, MS RD LDN Clinical Inpatient Dietitian Pager: [phone] Weekend/After hours pager: [phone]

## 2012-04-27 NOTE — Progress Notes (Signed)
Speech Language Pathology Dysphagia Treatment Patient Details Name: Connor Small MRN: [id number] DOB: [birth year] Today's Date: 04/27/2012 Time: 0820-0900 SLP Time Calculation (min): 40 min  Assessment / Plan / Recommendation Clinical Impression  Treatment focus on dysphagia goals. Oral care performed and pt administered ice chips. Pt demonstrated cough X 1 with first trial and refused further trials. Pt required max encouragement for PO intake due to his fear of "choking." Pt eventually observed with Dys. 3 textures and pudding thick liquids without overt s/s of aspiration but required Mod verbal cues for utilization of multiple swallows and an intermittent throat clear. Pt demonstrated very limited PO intake (2 bites of inside of biscuit and 3,  t tablespoons of yogurt) due to fatigue and lethargy. Pt and wife educated on clinical reasoning for current diet recommendations and need for frequent rest breaks during meal but also encouraged to increase PO intake as much as possible.  Both verbalized understanding.     Diet Recommendation  Continue with Current Diet: Pudding-thick liquid;Dysphagia 3 (mechanical soft)    SLP Plan Continue with current plan of care   Pertinent Vitals/Pain No/Denies Pain    General Temperature Spikes Noted: No Respiratory Status: Room air Behavior/Cognition: Alert;Cooperative;Pleasant mood Oral Cavity - Dentition: Adequate natural dentition Patient Positioning: Upright in bed  Oral Cavity - Oral Hygiene Does patient have any of the following "at risk" factors?: Nutritional status - dependent feeder;Nutritional status - inadequate;Lips - dry, cracked;Other - dysphagia Brush patient's teeth BID with toothbrush (using toothpaste with fluoride): Yes Patient is HIGH RISK - Oral Care Protocol followed (see row info): Yes Patient is AT RISK - Oral Care Protocol followed (see row info): Yes   Dysphagia Treatment Treatment focused on: Skilled observation of diet  tolerance;Utilization of compensatory strategies;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

## 2012-04-27 NOTE — Progress Notes (Signed)
drains from the abdomen.  Right lower quadrant ostomy. Paucity of gas in the bowel and colon.  Mild degenerative changes in the spine and hips.  IMPRESSION: Enteric tube tip is in the left upper quadrant consistent with location in the body of the stomach.  Paucity of gas in the remainder the abdomen.   Original Report Authenticated By: Lucienne Capers, M.D.    Dg Swallowing Func-speech Pathology  04/17/2012  [name], CCC-SLP     04/17/2012 11:12 AM Objective Swallowing Evaluation: Modified Barium Swallowing Study   Patient Details  Name: Connor Small MRN: [id number] Date of Birth: [birth year]  Today's Date: 04/17/2012 Time: 0940-1010 SLP Time Calculation (min): 30 min  Past Medical History:  Past Medical History  Diagnosis Date  . Chronic diarrhea   . Rectal bleed   . Hemorrhoids   . Ulcerative colitis     Distal UC over 8 yrs ago diagnosed  . Diverticulitis of large intestine with perforation 10/2011    done at [place]   Past Surgical History:  Past Surgical History  Procedure Laterality Date  . Colonoscopy  9/03  . Sigmoidoscopy       06/13/2002  . Temporary ostomy November 06, 2010      for a colon perforation that was done in [facility] (Dr  Payton Doughty).    . Colonoscopy  02/16/2011    Procedure: COLONOSCOPY;  Surgeon: Rogene Houston, MD;   [facility];  Service: Endoscopy;  Laterality: N/A;   100  . Flexible sigmoidoscopy  02/27/2012    Procedure: FLEXIBLE SIGMOIDOSCOPY;  Surgeon: Rogene Houston,  MD;  [facility];  Service:  Endoscopy;  Laterality:  N/A;  with colonic decompression  . Laparotomy  02/27/2012    Procedure: EXPLORATORY LAPAROTOMY;  Surgeon: Donato Heinz,  MD;  [facility];  Service: General;  Laterality: N/A;  . Cecostomy  02/27/2012    Procedure: CECOSTOMY;  Surgeon: Donato Heinz, MD;  [facility];  Service: General;  Laterality: N/A;  Cecostomy Tube  Placement  . Colectomy  02/28/2012    Procedure: TOTAL COLECTOMY;  Surgeon: Donato Heinz, MD;   [facility];  Service: General;  Laterality: N/A;  . Ileostomy  02/28/2012    Procedure: ILEOSTOMY;  Surgeon: Donato Heinz, MD;  [facility];  Service: General;  Laterality: N/A;  . Cystoscopy w/ ureteral stent placement Bilateral 03/20/2012    Procedure: CYSTOSCOPY WITH RETROGRADE PYELOGRAM/URETERAL STENT  PLACEMENT;  Surgeon: Alexis Frock, MD;  Location: [place];   Service: Urology;  Laterality: Bilateral;  . Laparotomy N/A 03/26/2012    Procedure: EXPLORATORY LAPAROTOMY;  Surgeon: Edward Jolly, MD;  Location: [place];  Service: General;  Laterality:  N/A;  . Application of wound vac N/A 03/26/2012    Procedure: APPLICATION OF WOUND VAC;  Surgeon: Edward Jolly, MD;  [facility];  Service: General;  Laterality:  N/A;  . Liver biopsy N/A 03/26/2012    Procedure: LIVER BIOPSY;  Surgeon: Edward Jolly, MD;   Location: [place];  Service: General;  Laterality: N/A;  . Laparotomy N/A 03/29/2012    Procedure: EXPLORATORY LAPAROTOMY, PARTIAL WOUND CLOSURE;   Surgeon: Edward Jolly, MD;  Location: [place];  Service:  General;  Laterality: N/A;  . Vacuum assisted closure change N/A 03/29/2012    Procedure: Open ABDOMINAL VACUUM  CHANGE;  Surgeon: Edward Jolly, MD;  Location: [place];  Patient ID: Connor Small, male   DOB: 11/13/1960, 52 y.o.   MRN: [id number] Request received for placement of a percutaneous gastrostomy tube in pt with hx of recent total colectomy/ileostomy for toxic megacolon/cdiff now with profound malnutrition/FTT, dysphagia. Additional PMH as below.Imaging studies were reviewed by Dr. Vernard Gambles. Exam: pt sl lethargic but arousable, responds to questions using hands/ soft voice comments; NG tube in place, wife in room; chest- dim BS bases; heart- tachy but regular; abd- soft,+BS,mildly tender, midline wound vac in place, intact rt ileostomy in place with air/green stool; ext- edema.   Filed Vitals:   04/27/12 1300 04/27/12 1400 04/27/12 1500 04/27/12 1620  BP: 106/65 92/65 115/72 108/67  Pulse: 120 113 117   Temp:    97.9 F (36.6 C)  TempSrc:    Axillary  Resp: 20 14 16 16   Height:      Weight:      SpO2: 99%   98%   Past Medical History  Diagnosis Date  . Chronic diarrhea   . Rectal bleed   . Hemorrhoids   . Ulcerative colitis     Distal UC over 8 yrs ago diagnosed  . Diverticulitis of large intestine with perforation 10/2011    done at [place]  . 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 history of UC (ulcerative colitis) 12/06/2010   Past Surgical History  Procedure Laterality Date  . Colonoscopy  9/03  . Sigmoidoscopy       06/13/2002  . Temporary ostomy November 06, 2010      for a colon perforation that was done in [facility] (Dr Payton Doughty).    . Colonoscopy  02/16/2011    Procedure: COLONOSCOPY;  Surgeon: Rogene Houston, MD;  [facility];  Service: Endoscopy;  Laterality: N/A;  100  . Flexible sigmoidoscopy  02/27/2012    Procedure: FLEXIBLE SIGMOIDOSCOPY;  Surgeon: Rogene Houston, MD;  [facility];  Service: Endoscopy;  Laterality: N/A;  with colonic decompression  . Laparotomy  02/27/2012    Procedure: EXPLORATORY LAPAROTOMY;  Surgeon: Donato Heinz, MD;  [facility];  Service: General;  Laterality: N/A;  . Cecostomy   02/27/2012    Procedure: CECOSTOMY;  Surgeon: Donato Heinz, MD;  [facility];  Service: General;  Laterality: N/A;  Cecostomy Tube Placement  . Colectomy  02/28/2012    Procedure: TOTAL COLECTOMY;  Surgeon: Donato Heinz, MD;  [facility];  Service: General;  Laterality: N/A;  . Ileostomy  02/28/2012    Procedure: ILEOSTOMY;  Surgeon: Donato Heinz, MD;  [facility];  Service: General;  Laterality: N/A;  . Cystoscopy w/ ureteral stent placement Bilateral 03/20/2012    Procedure: CYSTOSCOPY WITH RETROGRADE PYELOGRAM/URETERAL STENT PLACEMENT;  Surgeon: Alexis Frock, MD;  Location: [place];  Service: Urology;  Laterality: Bilateral;  . Laparotomy N/A 03/26/2012    Procedure: EXPLORATORY LAPAROTOMY;  Surgeon: Edward Jolly, MD;  Location: [place];  Service: General;  Laterality: N/A;  . Application of wound vac N/A 03/26/2012    Procedure: APPLICATION OF WOUND VAC;  Surgeon: Edward Jolly, MD;  [facility];  Service: General;  Laterality: N/A;  . Liver biopsy N/A 03/26/2012    Procedure: LIVER BIOPSY;  Surgeon: Edward Jolly, MD;  Location: [place];  Service: General;  Laterality: N/A;  . Laparotomy N/A 03/29/2012    Procedure: EXPLORATORY LAPAROTOMY, PARTIAL WOUND CLOSURE;  Surgeon: Edward Jolly, MD;  Location: [place];  Service: General;  Patient ID: Connor Small, male   DOB: 11/13/1960, 52 y.o.   MRN: [id number] Request received for placement of a percutaneous gastrostomy tube in pt with hx of recent total colectomy/ileostomy for toxic megacolon/cdiff now with profound malnutrition/FTT, dysphagia. Additional PMH as below.Imaging studies were reviewed by Dr. Vernard Gambles. Exam: pt sl lethargic but arousable, responds to questions using hands/ soft voice comments; NG tube in place, wife in room; chest- dim BS bases; heart- tachy but regular; abd- soft,+BS,mildly tender, midline wound vac in place, intact rt ileostomy in place with air/green stool; ext- edema.   Filed Vitals:   04/27/12 1300 04/27/12 1400 04/27/12 1500 04/27/12 1620  BP: 106/65 92/65 115/72 108/67  Pulse: 120 113 117   Temp:    97.9 F (36.6 C)  TempSrc:    Axillary  Resp: 20 14 16 16   Height:      Weight:      SpO2: 99%   98%   Past Medical History  Diagnosis Date  . Chronic diarrhea   . Rectal bleed   . Hemorrhoids   . Ulcerative colitis     Distal UC over 8 yrs ago diagnosed  . Diverticulitis of large intestine with perforation 10/2011    done at [place]  . 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 history of UC (ulcerative colitis) 12/06/2010   Past Surgical History  Procedure Laterality Date  . Colonoscopy  9/03  . Sigmoidoscopy       06/13/2002  . Temporary ostomy November 06, 2010      for a colon perforation that was done in [facility] (Dr Payton Doughty).    . Colonoscopy  02/16/2011    Procedure: COLONOSCOPY;  Surgeon: Rogene Houston, MD;  [facility];  Service: Endoscopy;  Laterality: N/A;  100  . Flexible sigmoidoscopy  02/27/2012    Procedure: FLEXIBLE SIGMOIDOSCOPY;  Surgeon: Rogene Houston, MD;  [facility];  Service: Endoscopy;  Laterality: N/A;  with colonic decompression  . Laparotomy  02/27/2012    Procedure: EXPLORATORY LAPAROTOMY;  Surgeon: Donato Heinz, MD;  [facility];  Service: General;  Laterality: N/A;  . Cecostomy   02/27/2012    Procedure: CECOSTOMY;  Surgeon: Donato Heinz, MD;  [facility];  Service: General;  Laterality: N/A;  Cecostomy Tube Placement  . Colectomy  02/28/2012    Procedure: TOTAL COLECTOMY;  Surgeon: Donato Heinz, MD;  [facility];  Service: General;  Laterality: N/A;  . Ileostomy  02/28/2012    Procedure: ILEOSTOMY;  Surgeon: Donato Heinz, MD;  [facility];  Service: General;  Laterality: N/A;  . Cystoscopy w/ ureteral stent placement Bilateral 03/20/2012    Procedure: CYSTOSCOPY WITH RETROGRADE PYELOGRAM/URETERAL STENT PLACEMENT;  Surgeon: Alexis Frock, MD;  Location: [place];  Service: Urology;  Laterality: Bilateral;  . Laparotomy N/A 03/26/2012    Procedure: EXPLORATORY LAPAROTOMY;  Surgeon: Edward Jolly, MD;  Location: [place];  Service: General;  Laterality: N/A;  . Application of wound vac N/A 03/26/2012    Procedure: APPLICATION OF WOUND VAC;  Surgeon: Edward Jolly, MD;  [facility];  Service: General;  Laterality: N/A;  . Liver biopsy N/A 03/26/2012    Procedure: LIVER BIOPSY;  Surgeon: Edward Jolly, MD;  Location: [place];  Service: General;  Laterality: N/A;  . Laparotomy N/A 03/29/2012    Procedure: EXPLORATORY LAPAROTOMY, PARTIAL WOUND CLOSURE;  Surgeon: Edward Jolly, MD;  Location: [place];  Service: General;  Patient ID: Connor Small, male   DOB: 11/13/1960, 52 y.o.   MRN: [id number] Request received for placement of a percutaneous gastrostomy tube in pt with hx of recent total colectomy/ileostomy for toxic megacolon/cdiff now with profound malnutrition/FTT, dysphagia. Additional PMH as below.Imaging studies were reviewed by Dr. Vernard Gambles. Exam: pt sl lethargic but arousable, responds to questions using hands/ soft voice comments; NG tube in place, wife in room; chest- dim BS bases; heart- tachy but regular; abd- soft,+BS,mildly tender, midline wound vac in place, intact rt ileostomy in place with air/green stool; ext- edema.   Filed Vitals:   04/27/12 1300 04/27/12 1400 04/27/12 1500 04/27/12 1620  BP: 106/65 92/65 115/72 108/67  Pulse: 120 113 117   Temp:    97.9 F (36.6 C)  TempSrc:    Axillary  Resp: 20 14 16 16   Height:      Weight:      SpO2: 99%   98%   Past Medical History  Diagnosis Date  . Chronic diarrhea   . Rectal bleed   . Hemorrhoids   . Ulcerative colitis     Distal UC over 8 yrs ago diagnosed  . Diverticulitis of large intestine with perforation 10/2011    done at [place]  . 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 history of UC (ulcerative colitis) 12/06/2010   Past Surgical History  Procedure Laterality Date  . Colonoscopy  9/03  . Sigmoidoscopy       06/13/2002  . Temporary ostomy November 06, 2010      for a colon perforation that was done in [facility] (Dr Payton Doughty).    . Colonoscopy  02/16/2011    Procedure: COLONOSCOPY;  Surgeon: Rogene Houston, MD;  [facility];  Service: Endoscopy;  Laterality: N/A;  100  . Flexible sigmoidoscopy  02/27/2012    Procedure: FLEXIBLE SIGMOIDOSCOPY;  Surgeon: Rogene Houston, MD;  [facility];  Service: Endoscopy;  Laterality: N/A;  with colonic decompression  . Laparotomy  02/27/2012    Procedure: EXPLORATORY LAPAROTOMY;  Surgeon: Donato Heinz, MD;  [facility];  Service: General;  Laterality: N/A;  . Cecostomy   02/27/2012    Procedure: CECOSTOMY;  Surgeon: Donato Heinz, MD;  [facility];  Service: General;  Laterality: N/A;  Cecostomy Tube Placement  . Colectomy  02/28/2012    Procedure: TOTAL COLECTOMY;  Surgeon: Donato Heinz, MD;  [facility];  Service: General;  Laterality: N/A;  . Ileostomy  02/28/2012    Procedure: ILEOSTOMY;  Surgeon: Donato Heinz, MD;  [facility];  Service: General;  Laterality: N/A;  . Cystoscopy w/ ureteral stent placement Bilateral 03/20/2012    Procedure: CYSTOSCOPY WITH RETROGRADE PYELOGRAM/URETERAL STENT PLACEMENT;  Surgeon: Alexis Frock, MD;  Location: [place];  Service: Urology;  Laterality: Bilateral;  . Laparotomy N/A 03/26/2012    Procedure: EXPLORATORY LAPAROTOMY;  Surgeon: Edward Jolly, MD;  Location: [place];  Service: General;  Laterality: N/A;  . Application of wound vac N/A 03/26/2012    Procedure: APPLICATION OF WOUND VAC;  Surgeon: Edward Jolly, MD;  [facility];  Service: General;  Laterality: N/A;  . Liver biopsy N/A 03/26/2012    Procedure: LIVER BIOPSY;  Surgeon: Edward Jolly, MD;  Location: [place];  Service: General;  Laterality: N/A;  . Laparotomy N/A 03/29/2012    Procedure: EXPLORATORY LAPAROTOMY, PARTIAL WOUND CLOSURE;  Surgeon: Edward Jolly, MD;  Location: [place];  Service: General;  Patient ID: Connor Small, male   DOB: 11/13/1960, 52 y.o.   MRN: [id number] Request received for placement of a percutaneous gastrostomy tube in pt with hx of recent total colectomy/ileostomy for toxic megacolon/cdiff now with profound malnutrition/FTT, dysphagia. Additional PMH as below.Imaging studies were reviewed by Dr. Vernard Gambles. Exam: pt sl lethargic but arousable, responds to questions using hands/ soft voice comments; NG tube in place, wife in room; chest- dim BS bases; heart- tachy but regular; abd- soft,+BS,mildly tender, midline wound vac in place, intact rt ileostomy in place with air/green stool; ext- edema.   Filed Vitals:   04/27/12 1300 04/27/12 1400 04/27/12 1500 04/27/12 1620  BP: 106/65 92/65 115/72 108/67  Pulse: 120 113 117   Temp:    97.9 F (36.6 C)  TempSrc:    Axillary  Resp: 20 14 16 16   Height:      Weight:      SpO2: 99%   98%   Past Medical History  Diagnosis Date  . Chronic diarrhea   . Rectal bleed   . Hemorrhoids   . Ulcerative colitis     Distal UC over 8 yrs ago diagnosed  . Diverticulitis of large intestine with perforation 10/2011    done at [place]  . 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 history of UC (ulcerative colitis) 12/06/2010   Past Surgical History  Procedure Laterality Date  . Colonoscopy  9/03  . Sigmoidoscopy       06/13/2002  . Temporary ostomy November 06, 2010      for a colon perforation that was done in [facility] (Dr Payton Doughty).    . Colonoscopy  02/16/2011    Procedure: COLONOSCOPY;  Surgeon: Rogene Houston, MD;  [facility];  Service: Endoscopy;  Laterality: N/A;  100  . Flexible sigmoidoscopy  02/27/2012    Procedure: FLEXIBLE SIGMOIDOSCOPY;  Surgeon: Rogene Houston, MD;  [facility];  Service: Endoscopy;  Laterality: N/A;  with colonic decompression  . Laparotomy  02/27/2012    Procedure: EXPLORATORY LAPAROTOMY;  Surgeon: Donato Heinz, MD;  [facility];  Service: General;  Laterality: N/A;  . Cecostomy   02/27/2012    Procedure: CECOSTOMY;  Surgeon: Donato Heinz, MD;  [facility];  Service: General;  Laterality: N/A;  Cecostomy Tube Placement  . Colectomy  02/28/2012    Procedure: TOTAL COLECTOMY;  Surgeon: Donato Heinz, MD;  [facility];  Service: General;  Laterality: N/A;  . Ileostomy  02/28/2012    Procedure: ILEOSTOMY;  Surgeon: Donato Heinz, MD;  [facility];  Service: General;  Laterality: N/A;  . Cystoscopy w/ ureteral stent placement Bilateral 03/20/2012    Procedure: CYSTOSCOPY WITH RETROGRADE PYELOGRAM/URETERAL STENT PLACEMENT;  Surgeon: Alexis Frock, MD;  Location: [place];  Service: Urology;  Laterality: Bilateral;  . Laparotomy N/A 03/26/2012    Procedure: EXPLORATORY LAPAROTOMY;  Surgeon: Edward Jolly, MD;  Location: [place];  Service: General;  Laterality: N/A;  . Application of wound vac N/A 03/26/2012    Procedure: APPLICATION OF WOUND VAC;  Surgeon: Edward Jolly, MD;  [facility];  Service: General;  Laterality: N/A;  . Liver biopsy N/A 03/26/2012    Procedure: LIVER BIOPSY;  Surgeon: Edward Jolly, MD;  Location: [place];  Service: General;  Laterality: N/A;  . Laparotomy N/A 03/29/2012    Procedure: EXPLORATORY LAPAROTOMY, PARTIAL WOUND CLOSURE;  Surgeon: Edward Jolly, MD;  Location: [place];  Service: General;  drains from the abdomen.  Right lower quadrant ostomy. Paucity of gas in the bowel and colon.  Mild degenerative changes in the spine and hips.  IMPRESSION: Enteric tube tip is in the left upper quadrant consistent with location in the body of the stomach.  Paucity of gas in the remainder the abdomen.   Original Report Authenticated By: Lucienne Capers, M.D.    Dg Swallowing Func-speech Pathology  04/17/2012  [name], CCC-SLP     04/17/2012 11:12 AM Objective Swallowing Evaluation: Modified Barium Swallowing Study   Patient Details  Name: Connor Small MRN: [id number] Date of Birth: [birth year]  Today's Date: 04/17/2012 Time: 0940-1010 SLP Time Calculation (min): 30 min  Past Medical History:  Past Medical History  Diagnosis Date  . Chronic diarrhea   . Rectal bleed   . Hemorrhoids   . Ulcerative colitis     Distal UC over 8 yrs ago diagnosed  . Diverticulitis of large intestine with perforation 10/2011    done at [place]   Past Surgical History:  Past Surgical History  Procedure Laterality Date  . Colonoscopy  9/03  . Sigmoidoscopy       06/13/2002  . Temporary ostomy November 06, 2010      for a colon perforation that was done in [facility] (Dr  Payton Doughty).    . Colonoscopy  02/16/2011    Procedure: COLONOSCOPY;  Surgeon: Rogene Houston, MD;   [facility];  Service: Endoscopy;  Laterality: N/A;   100  . Flexible sigmoidoscopy  02/27/2012    Procedure: FLEXIBLE SIGMOIDOSCOPY;  Surgeon: Rogene Houston,  MD;  [facility];  Service:  Endoscopy;  Laterality:  N/A;  with colonic decompression  . Laparotomy  02/27/2012    Procedure: EXPLORATORY LAPAROTOMY;  Surgeon: Donato Heinz,  MD;  [facility];  Service: General;  Laterality: N/A;  . Cecostomy  02/27/2012    Procedure: CECOSTOMY;  Surgeon: Donato Heinz, MD;  [facility];  Service: General;  Laterality: N/A;  Cecostomy Tube  Placement  . Colectomy  02/28/2012    Procedure: TOTAL COLECTOMY;  Surgeon: Donato Heinz, MD;   [facility];  Service: General;  Laterality: N/A;  . Ileostomy  02/28/2012    Procedure: ILEOSTOMY;  Surgeon: Donato Heinz, MD;  [facility];  Service: General;  Laterality: N/A;  . Cystoscopy w/ ureteral stent placement Bilateral 03/20/2012    Procedure: CYSTOSCOPY WITH RETROGRADE PYELOGRAM/URETERAL STENT  PLACEMENT;  Surgeon: Alexis Frock, MD;  Location: [place];   Service: Urology;  Laterality: Bilateral;  . Laparotomy N/A 03/26/2012    Procedure: EXPLORATORY LAPAROTOMY;  Surgeon: Edward Jolly, MD;  Location: [place];  Service: General;  Laterality:  N/A;  . Application of wound vac N/A 03/26/2012    Procedure: APPLICATION OF WOUND VAC;  Surgeon: Edward Jolly, MD;  [facility];  Service: General;  Laterality:  N/A;  . Liver biopsy N/A 03/26/2012    Procedure: LIVER BIOPSY;  Surgeon: Edward Jolly, MD;   Location: [place];  Service: General;  Laterality: N/A;  . Laparotomy N/A 03/29/2012    Procedure: EXPLORATORY LAPAROTOMY, PARTIAL WOUND CLOSURE;   Surgeon: Edward Jolly, MD;  Location: [place];  Service:  General;  Laterality: N/A;  . Vacuum assisted closure change N/A 03/29/2012    Procedure: Open ABDOMINAL VACUUM  CHANGE;  Surgeon: Edward Jolly, MD;  Location: [place];  Patient ID: Connor Small, male   DOB: 11/13/1960, 52 y.o.   MRN: [id number] Request received for placement of a percutaneous gastrostomy tube in pt with hx of recent total colectomy/ileostomy for toxic megacolon/cdiff now with profound malnutrition/FTT, dysphagia. Additional PMH as below.Imaging studies were reviewed by Dr. Vernard Gambles. Exam: pt sl lethargic but arousable, responds to questions using hands/ soft voice comments; NG tube in place, wife in room; chest- dim BS bases; heart- tachy but regular; abd- soft,+BS,mildly tender, midline wound vac in place, intact rt ileostomy in place with air/green stool; ext- edema.   Filed Vitals:   04/27/12 1300 04/27/12 1400 04/27/12 1500 04/27/12 1620  BP: 106/65 92/65 115/72 108/67  Pulse: 120 113 117   Temp:    97.9 F (36.6 C)  TempSrc:    Axillary  Resp: 20 14 16 16   Height:      Weight:      SpO2: 99%   98%   Past Medical History  Diagnosis Date  . Chronic diarrhea   . Rectal bleed   . Hemorrhoids   . Ulcerative colitis     Distal UC over 8 yrs ago diagnosed  . Diverticulitis of large intestine with perforation 10/2011    done at [place]  . 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 history of UC (ulcerative colitis) 12/06/2010   Past Surgical History  Procedure Laterality Date  . Colonoscopy  9/03  . Sigmoidoscopy       06/13/2002  . Temporary ostomy November 06, 2010      for a colon perforation that was done in [facility] (Dr Payton Doughty).    . Colonoscopy  02/16/2011    Procedure: COLONOSCOPY;  Surgeon: Rogene Houston, MD;  [facility];  Service: Endoscopy;  Laterality: N/A;  100  . Flexible sigmoidoscopy  02/27/2012    Procedure: FLEXIBLE SIGMOIDOSCOPY;  Surgeon: Rogene Houston, MD;  [facility];  Service: Endoscopy;  Laterality: N/A;  with colonic decompression  . Laparotomy  02/27/2012    Procedure: EXPLORATORY LAPAROTOMY;  Surgeon: Donato Heinz, MD;  [facility];  Service: General;  Laterality: N/A;  . Cecostomy   02/27/2012    Procedure: CECOSTOMY;  Surgeon: Donato Heinz, MD;  [facility];  Service: General;  Laterality: N/A;  Cecostomy Tube Placement  . Colectomy  02/28/2012    Procedure: TOTAL COLECTOMY;  Surgeon: Donato Heinz, MD;  [facility];  Service: General;  Laterality: N/A;  . Ileostomy  02/28/2012    Procedure: ILEOSTOMY;  Surgeon: Donato Heinz, MD;  [facility];  Service: General;  Laterality: N/A;  . Cystoscopy w/ ureteral stent placement Bilateral 03/20/2012    Procedure: CYSTOSCOPY WITH RETROGRADE PYELOGRAM/URETERAL STENT PLACEMENT;  Surgeon: Alexis Frock, MD;  Location: [place];  Service: Urology;  Laterality: Bilateral;  . Laparotomy N/A 03/26/2012    Procedure: EXPLORATORY LAPAROTOMY;  Surgeon: Edward Jolly, MD;  Location: [place];  Service: General;  Laterality: N/A;  . Application of wound vac N/A 03/26/2012    Procedure: APPLICATION OF WOUND VAC;  Surgeon: Edward Jolly, MD;  [facility];  Service: General;  Laterality: N/A;  . Liver biopsy N/A 03/26/2012    Procedure: LIVER BIOPSY;  Surgeon: Edward Jolly, MD;  Location: [place];  Service: General;  Laterality: N/A;  . Laparotomy N/A 03/29/2012    Procedure: EXPLORATORY LAPAROTOMY, PARTIAL WOUND CLOSURE;  Surgeon: Edward Jolly, MD;  Location: [place];  Service: General;  Patient ID: Connor Small, male   DOB: 11/13/1960, 52 y.o.   MRN: [id number] Request received for placement of a percutaneous gastrostomy tube in pt with hx of recent total colectomy/ileostomy for toxic megacolon/cdiff now with profound malnutrition/FTT, dysphagia. Additional PMH as below.Imaging studies were reviewed by Dr. Vernard Gambles. Exam: pt sl lethargic but arousable, responds to questions using hands/ soft voice comments; NG tube in place, wife in room; chest- dim BS bases; heart- tachy but regular; abd- soft,+BS,mildly tender, midline wound vac in place, intact rt ileostomy in place with air/green stool; ext- edema.   Filed Vitals:   04/27/12 1300 04/27/12 1400 04/27/12 1500 04/27/12 1620  BP: 106/65 92/65 115/72 108/67  Pulse: 120 113 117   Temp:    97.9 F (36.6 C)  TempSrc:    Axillary  Resp: 20 14 16 16   Height:      Weight:      SpO2: 99%   98%   Past Medical History  Diagnosis Date  . Chronic diarrhea   . Rectal bleed   . Hemorrhoids   . Ulcerative colitis     Distal UC over 8 yrs ago diagnosed  . Diverticulitis of large intestine with perforation 10/2011    done at [place]  . 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 history of UC (ulcerative colitis) 12/06/2010   Past Surgical History  Procedure Laterality Date  . Colonoscopy  9/03  . Sigmoidoscopy       06/13/2002  . Temporary ostomy November 06, 2010      for a colon perforation that was done in [facility] (Dr Payton Doughty).    . Colonoscopy  02/16/2011    Procedure: COLONOSCOPY;  Surgeon: Rogene Houston, MD;  [facility];  Service: Endoscopy;  Laterality: N/A;  100  . Flexible sigmoidoscopy  02/27/2012    Procedure: FLEXIBLE SIGMOIDOSCOPY;  Surgeon: Rogene Houston, MD;  [facility];  Service: Endoscopy;  Laterality: N/A;  with colonic decompression  . Laparotomy  02/27/2012    Procedure: EXPLORATORY LAPAROTOMY;  Surgeon: Donato Heinz, MD;  Location: [facility];  Service: General;  Laterality: N/A;  . Cecostomy   02/27/2012    Procedure: CECOSTOMY;  Surgeon: Donato Heinz, MD;  [facility];  Service: General;  Laterality: N/A;  Cecostomy Tube Placement  . Colectomy  02/28/2012    Procedure: TOTAL COLECTOMY;  Surgeon: Donato Heinz, MD;  [facility];  Service: General;  Laterality: N/A;  . Ileostomy  02/28/2012    Procedure: ILEOSTOMY;  Surgeon: Donato Heinz, MD;  [facility];  Service: General;  Laterality: N/A;  . Cystoscopy w/ ureteral stent placement Bilateral 03/20/2012    Procedure: CYSTOSCOPY WITH RETROGRADE PYELOGRAM/URETERAL STENT PLACEMENT;  Surgeon: Alexis Frock, MD;  Location: [place];  Service: Urology;  Laterality: Bilateral;  . Laparotomy N/A 03/26/2012    Procedure: EXPLORATORY LAPAROTOMY;  Surgeon: Edward Jolly, MD;  Location: [place];  Service: General;  Laterality: N/A;  . Application of wound vac N/A 03/26/2012    Procedure: APPLICATION OF WOUND VAC;  Surgeon: Edward Jolly, MD;  [facility];  Service: General;  Laterality: N/A;  . Liver biopsy N/A 03/26/2012    Procedure: LIVER BIOPSY;  Surgeon: Edward Jolly, MD;  Location: [place];  Service: General;  Laterality: N/A;  . Laparotomy N/A 03/29/2012    Procedure: EXPLORATORY LAPAROTOMY, PARTIAL WOUND CLOSURE;  Surgeon: Edward Jolly, MD;  Location: [place];  Service: General;  Patient ID: Connor Small, male   DOB: 11/13/1960, 52 y.o.   MRN: [id number] Request received for placement of a percutaneous gastrostomy tube in pt with hx of recent total colectomy/ileostomy for toxic megacolon/cdiff now with profound malnutrition/FTT, dysphagia. Additional PMH as below.Imaging studies were reviewed by Dr. Vernard Gambles. Exam: pt sl lethargic but arousable, responds to questions using hands/ soft voice comments; NG tube in place, wife in room; chest- dim BS bases; heart- tachy but regular; abd- soft,+BS,mildly tender, midline wound vac in place, intact rt ileostomy in place with air/green stool; ext- edema.   Filed Vitals:   04/27/12 1300 04/27/12 1400 04/27/12 1500 04/27/12 1620  BP: 106/65 92/65 115/72 108/67  Pulse: 120 113 117   Temp:    97.9 F (36.6 C)  TempSrc:    Axillary  Resp: 20 14 16 16   Height:      Weight:      SpO2: 99%   98%   Past Medical History  Diagnosis Date  . Chronic diarrhea   . Rectal bleed   . Hemorrhoids   . Ulcerative colitis     Distal UC over 8 yrs ago diagnosed  . Diverticulitis of large intestine with perforation 10/2011    done at [place]  . 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 history of UC (ulcerative colitis) 12/06/2010   Past Surgical History  Procedure Laterality Date  . Colonoscopy  9/03  . Sigmoidoscopy       06/13/2002  . Temporary ostomy November 06, 2010      for a colon perforation that was done in [facility] (Dr Payton Doughty).    . Colonoscopy  02/16/2011    Procedure: COLONOSCOPY;  Surgeon: Rogene Houston, MD;  [facility];  Service: Endoscopy;  Laterality: N/A;  100  . Flexible sigmoidoscopy  02/27/2012    Procedure: FLEXIBLE SIGMOIDOSCOPY;  Surgeon: Rogene Houston, MD;  [facility];  Service: Endoscopy;  Laterality: N/A;  with colonic decompression  . Laparotomy  02/27/2012    Procedure: EXPLORATORY LAPAROTOMY;  Surgeon: Donato Heinz, MD;  [facility];  Service: General;  Laterality: N/A;  . Cecostomy   02/27/2012    Procedure: CECOSTOMY;  Surgeon: Donato Heinz, MD;  [facility];  Service: General;  Laterality: N/A;  Cecostomy Tube Placement  . Colectomy  02/28/2012    Procedure: TOTAL COLECTOMY;  Surgeon: Donato Heinz, MD;  [facility];  Service: General;  Laterality: N/A;  . Ileostomy  02/28/2012    Procedure: ILEOSTOMY;  Surgeon: Donato Heinz, MD;  [facility];  Service: General;  Laterality: N/A;  . Cystoscopy w/ ureteral stent placement Bilateral 03/20/2012    Procedure: CYSTOSCOPY WITH RETROGRADE PYELOGRAM/URETERAL STENT PLACEMENT;  Surgeon: Alexis Frock, MD;  Location: [place];  Service: Urology;  Laterality: Bilateral;  . Laparotomy N/A 03/26/2012    Procedure: EXPLORATORY LAPAROTOMY;  Surgeon: Edward Jolly, MD;  Location: [place];  Service: General;  Laterality: N/A;  . Application of wound vac N/A 03/26/2012    Procedure: APPLICATION OF WOUND VAC;  Surgeon: Edward Jolly, MD;  [facility];  Service: General;  Laterality: N/A;  . Liver biopsy N/A 03/26/2012    Procedure: LIVER BIOPSY;  Surgeon: Edward Jolly, MD;  Location: [place];  Service: General;  Laterality: N/A;  . Laparotomy N/A 03/29/2012    Procedure: EXPLORATORY LAPAROTOMY, PARTIAL WOUND CLOSURE;  Surgeon: Edward Jolly, MD;  Location: [place];  Service: General;  drains from the abdomen.  Right lower quadrant ostomy. Paucity of gas in the bowel and colon.  Mild degenerative changes in the spine and hips.  IMPRESSION: Enteric tube tip is in the left upper quadrant consistent with location in the body of the stomach.  Paucity of gas in the remainder the abdomen.   Original Report Authenticated By: Lucienne Capers, M.D.    Dg Swallowing Func-speech Pathology  04/17/2012  [name], CCC-SLP     04/17/2012 11:12 AM Objective Swallowing Evaluation: Modified Barium Swallowing Study   Patient Details  Name: Connor Small MRN: [id number] Date of Birth: [birth year]  Today's Date: 04/17/2012 Time: 0940-1010 SLP Time Calculation (min): 30 min  Past Medical History:  Past Medical History  Diagnosis Date  . Chronic diarrhea   . Rectal bleed   . Hemorrhoids   . Ulcerative colitis     Distal UC over 8 yrs ago diagnosed  . Diverticulitis of large intestine with perforation 10/2011    done at [place]   Past Surgical History:  Past Surgical History  Procedure Laterality Date  . Colonoscopy  9/03  . Sigmoidoscopy       06/13/2002  . Temporary ostomy November 06, 2010      for a colon perforation that was done in [facility] (Dr  Payton Doughty).    . Colonoscopy  02/16/2011    Procedure: COLONOSCOPY;  Surgeon: Rogene Houston, MD;   [facility];  Service: Endoscopy;  Laterality: N/A;   100  . Flexible sigmoidoscopy  02/27/2012    Procedure: FLEXIBLE SIGMOIDOSCOPY;  Surgeon: Rogene Houston,  MD;  [facility];  Service:  Endoscopy;  Laterality:  N/A;  with colonic decompression  . Laparotomy  02/27/2012    Procedure: EXPLORATORY LAPAROTOMY;  Surgeon: Donato Heinz,  MD;  [facility];  Service: General;  Laterality: N/A;  . Cecostomy  02/27/2012    Procedure: CECOSTOMY;  Surgeon: Donato Heinz, MD;  [facility];  Service: General;  Laterality: N/A;  Cecostomy Tube  Placement  . Colectomy  02/28/2012    Procedure: TOTAL COLECTOMY;  Surgeon: Donato Heinz, MD;   [facility];  Service: General;  Laterality: N/A;  . Ileostomy  02/28/2012    Procedure: ILEOSTOMY;  Surgeon: Donato Heinz, MD;  [facility];  Service: General;  Laterality: N/A;  . Cystoscopy w/ ureteral stent placement Bilateral 03/20/2012    Procedure: CYSTOSCOPY WITH RETROGRADE PYELOGRAM/URETERAL STENT  PLACEMENT;  Surgeon: Alexis Frock, MD;  Location: [place];   Service: Urology;  Laterality: Bilateral;  . Laparotomy N/A 03/26/2012    Procedure: EXPLORATORY LAPAROTOMY;  Surgeon: Edward Jolly, MD;  Location: [place];  Service: General;  Laterality:  N/A;  . Application of wound vac N/A 03/26/2012    Procedure: APPLICATION OF WOUND VAC;  Surgeon: Edward Jolly, MD;  [facility];  Service: General;  Laterality:  N/A;  . Liver biopsy N/A 03/26/2012    Procedure: LIVER BIOPSY;  Surgeon: Edward Jolly, MD;   Location: [place];  Service: General;  Laterality: N/A;  . Laparotomy N/A 03/29/2012    Procedure: EXPLORATORY LAPAROTOMY, PARTIAL WOUND CLOSURE;   Surgeon: Edward Jolly, MD;  Location: [place];  Service:  General;  Laterality: N/A;  . Vacuum assisted closure change N/A 03/29/2012    Procedure: Open ABDOMINAL VACUUM  CHANGE;  Surgeon: Edward Jolly, MD;  Location: [place];

## 2012-04-27 NOTE — Progress Notes (Signed)
Nurse was requested in pt room by wife.  Wife was crying stating that she wanted the surgeon to come back by and speak with her in regards to having PEG tube placed today instead of Monday.  Nurse comforted wife and pt.  Pt assessment remained the same as am assessment, pt denied pain and stated "I'm just tired".  Nurse contacted MD [facility] office spoke with Triage nurse and relayed request.    Nurse was called back into pts room by wife; she requested  conference with hospitalist team to discuss her concerns.  Upon entering pt room, nurse noticed both wife and patient crying, when pt was asked why he was crying ,pt grabbed  wife's hand but denied complaints.  Nurse contacted MD.  MD assessed pt at approximately 1359.  Nurse will continue to monitor.

## 2012-04-27 NOTE — Progress Notes (Signed)
Chaplain Note:  Chaplain visited with pt and pt's wife.  Pt was resting in bed, moving between sleep and wakefulness.  Pt is physically, mentally, and spiritually exhausted from his long hospital stay.  Chaplain provided spiritual comfort, support, and prayer for pt and pt's wife.  Pt and wife expressed appreciation for chaplain support.  Chaplain will follow up as needed.  04/27/12 1400  Clinical Encounter Type  Visited With Patient and family together  Visit Type Spiritual support  Referral From Patient  Recommendations Pt requests daily visits for prayer.  Spiritual Encounters  Spiritual Needs Prayer;Emotional  Stress Factors  Patient Stress Factors Exhausted;Health changes;Loss of control;Major life changes  Family Stress Factors Exhausted;Health changes  Jearld Lesch, [place]

## 2012-04-27 NOTE — Plan of Care (Signed)
Called by RN regarding why his anxiousness regarding decision to pursue PEG tube. Since my note initially composed the patient and wife have decided to pursue PEG tube placement. Primary reason for delaying decision was the cause patient was concerned about amount of pain he may undergo during procedure and also concern that procedure could lead to reintubation. Wife asked for internal medicine provider to come to the bedside and talk with her regarding timing of PEG tube. Extensive discussion held with wife. She is quite anxious and becoming very overwhelmed with her husband's current status. She endorses she feels the patient may be becoming emotionally depressed and "might be giving up". She becomes tearful when discussing this. When directly questioned she endorsed has adequate social and personal support for herself regarding this difficult situation she is experiencing. Chaplain already at bedside providing support as well. During our conversation the surgical PA presented to the bedside and again reassured the patient's wife that it is in the patient's best interests to pursue a PEG tube and that the interventional radiology approach, if possible, would be best since this would avoid significant conscious sedation and esophageal intubation with endoscopy tube. I have confirmed in Epic that an interventional radiology order for placement of gastrostomy tube is in place. I also called the interventional radiology department to confirm, one of their PAs will present to the bedside later today for formal consultation and evaluation. I did confirm with interventional radiology department that elective procedure such as PEG tube's are not performed over the weekend since they are staffed only with on-call personnel for emergency procedures only. A return to the bedside to tell the wife that most likely the procedure would not be able to take place until early next week either Monday or Tuesday she had left the  room. I updated the patient's nurse Aldona Bar and asked her to relay this information to the wife.  Erin Hearing, ANP [phone]

## 2012-04-27 NOTE — Progress Notes (Signed)
MEDICATION RELATED CONSULT NOTE - INITIAL   Pharmacy Consult for Aranesp Indication: Anemia of Chronic Illness  Allergies  Allergen Reactions  . Penicillins Other (See Comments)    Heart rate changes  . Morphine And Related     Nausea/vomiting  . Oxycodone     Nausea/vomiting  . Imipenem Rash    Patient Measurements: Height: 5' 9"  (175.3 cm) Weight: 142 lb 12.8 oz (64.774 kg) IBW/kg (Calculated) : 70.7  Vital Signs: Temp: 97.6 F (36.4 C) (04/04 0752) Temp src: Axillary (04/04 0752) BP: 108/66 mmHg (04/04 1100) Pulse Rate: 114 (04/04 1100) Intake/Output from previous day: 04/03 0701 - 04/04 0700 In: 1345 [I.V.:815; NG/GT:530] Out: 750 [Urine:550; Stool:200] Intake/Output from this shift: Total I/O In: 160 [I.V.:40; NG/GT:120] Out: -   Labs:  Recent Labs  04/25/12 0005 04/26/12 0600 04/27/12 0500 04/27/12 1137  WBC 16.7* 15.6* 16.3*  --   HGB 8.0* 7.8* 7.7*  --   HCT 23.9* 23.5* 24.0*  --   PLT 403* 364 364  --   CREATININE 1.54* 1.34 1.21 1.16  MG 1.6  --   --   --   PHOS 3.7  --   --   --   ALBUMIN 1.4* 1.2*  --  1.3*  PROT 5.0* 4.8*  --  4.9*  AST 37 27  --  22  ALT 28 23  --  19  ALKPHOS 294* 296*  --  295*  BILITOT 1.2 1.0  --  1.0   Estimated Creatinine Clearance: 66.7 ml/min (by C-G formula based on Cr of 1.16).   Microbiology: Recent Results (from the past 720 hour(s))  CULTURE, EXPECTORATED SPUTUM-ASSESSMENT     Status: None   Collection Time    04/06/12  1:00 PM      Result Value Range Status   Specimen Description SPUTUM   Final   Special Requests Normal   Final   Sputum evaluation     Final   Value: THIS SPECIMEN IS ACCEPTABLE. RESPIRATORY CULTURE REPORT TO FOLLOW.   Report Status 04/06/2012 FINAL   Final  CULTURE, RESPIRATORY (NON-EXPECTORATED)     Status: None   Collection Time    04/06/12  1:00 PM      Result Value Range Status   Specimen Description SPUTUM   Final   Special Requests NONE   Final   Gram Stain     Final   Value: FEW WBC PRESENT,BOTH PMN AND MONONUCLEAR     MODERATE SQUAMOUS EPITHELIAL CELLS PRESENT     ABUNDANT GRAM POSITIVE COCCI IN CLUSTERS     IN PAIRS IN CHAINS   Culture NORMAL OROPHARYNGEAL FLORA   Final   Report Status 04/08/2012 FINAL   Final  URINE CULTURE     Status: None   Collection Time    04/10/12  2:49 PM      Result Value Range Status   Specimen Description URINE, CLEAN CATCH   Final   Special Requests NONE   Final   Culture  Setup Time 04/10/2012 15:12   Final   Colony Count 30,000 COLONIES/ML   Final   Culture ENTEROBACTER CLOACAE   Final   Report Status 04/14/2012 FINAL   Final   Organism ID, Bacteria ENTEROBACTER CLOACAE   Final  CULTURE, BLOOD (ROUTINE X 2)     Status: None   Collection Time    04/21/12  8:40 AM      Result Value Range Status   Specimen Description BLOOD LEFT ARM  Final   Special Requests BOTTLES DRAWN AEROBIC AND ANAEROBIC 10CC   Final   Culture  Setup Time 04/21/2012 18:02   Final   Culture NO GROWTH 5 DAYS   Final   Report Status 04/27/2012 FINAL   Final  CULTURE, BLOOD (ROUTINE X 2)     Status: None   Collection Time    04/21/12  8:50 AM      Result Value Range Status   Specimen Description BLOOD LEFT ARM   Final   Special Requests BOTTLES DRAWN AEROBIC ONLY 10CC   Final   Culture  Setup Time 04/21/2012 18:02   Final   Culture NO GROWTH 5 DAYS   Final   Report Status 04/27/2012 FINAL   Final  BODY FLUID CULTURE     Status: None   Collection Time    04/25/12 12:25 PM      Result Value Range Status   Specimen Description FLUID PERITONEAL   Final   Special Requests NONE   Final   Gram Stain     Final   Value: NO WBC SEEN     NO ORGANISMS SEEN   Culture NO GROWTH 2 DAYS   Final   Report Status PENDING   Incomplete  CULTURE, FUNGUS WITHOUT SMEAR     Status: None   Collection Time    04/25/12 12:26 PM      Result Value Range Status   Specimen Description FLUID PERITONEAL   Final   Special Requests 60ML FLUID   Final   Culture  CULTURE IN PROGRESS FOR FOUR WEEKS   Final   Report Status PENDING   Incomplete    Medical History: Past Medical History  Diagnosis Date  . Chronic diarrhea   . Rectal bleed   . Hemorrhoids   . Ulcerative colitis     Distal UC over 8 yrs ago diagnosed  . Diverticulitis of large intestine with perforation 10/2011    done at [place]  . 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 history of UC (ulcerative colitis) 12/06/2010    Medications:  Scheduled:  . aspirin  81 mg Per NG tube Daily  . chlorhexidine  15 mL Mouth Rinse BID  . feeding supplement (VITAL AF 1.2 CAL)  1,000 mL Per Tube Q24H  . ferrous sulfate  300 mg Per Tube BID WC  . insulin aspart  0-5 Units Subcutaneous QHS  . insulin aspart  0-9 Units Subcutaneous TID WC  . lidocaine  1 patch Transdermal Q24H  . lip balm   Topical BID  . loperamide  2 mg Oral QHS  . multivitamin  5 mL Per Tube Daily  . pantoprazole sodium  40 mg Per Tube Q1200  . potassium chloride  20 mEq Oral BID  . prochlorperazine  10 mg Intravenous Q6H  . saccharomyces boulardii  250 mg Oral BID    Assessment: 52 year old male who has been hospitalized since 02/26/12.  His hemoglobin is currently below his baseline of 9.5, and Aranesp is requested by [facility].  He is currently receiving Ferrous Sulfate BID.  Goal of Therapy:  Hgb increase of at least 1 gm/dL after 4 weeks of Aranesp  Plan:  Check Anemia Panel with AM labs 4/5 to evaluate for potential causes of anemia.   Aranesp 69mg SQ qweek to start on Saturday 4/5 after Anemia Panel is obtained. Increase Ferrous Sulfate to TID. Check CBC and Reticulocyte count at least weekly while on Aranesp.   MLegrand Como Pharm.D., BCPS  Clinical Pharmacist Phone: [phone] or [phone] Pager: [phone] 04/27/2012, 12:53 PM

## 2012-04-27 NOTE — Progress Notes (Signed)
Nutrition Brief Note  RD following pt closely and notes changes in plan of care since discussion with wife this am. Pt/wife now hesitantly considering PEG for pt.   CCS note reviewed.  Agree that PEG placement would benefit pt in allowing more practice and comfort when eating and participating in [place].  Also agree that pt's degree of malnutrition is severe and long-term (but not indefinite) nutrition support would be in pt's best interest at this time.   RD did not address these changes or plans with wife as an extensive discussion with this writer was already held with similar subject matter this a.m.  Note [facility]'s possible preference for a more concentrated formula.  If this is warranted, this RD would recommend Vital 1.5 with a new goal rate of 60 mL/hr and close monitoring of ostomy output for hyperosmolar diarrhea.   Please note that in the event pt/wife decide against PEG, or if PEG cannot be placed, RD can work with pt on concentrated, cyclic, or bolus feeds via NGT if warranted by MD.  Recommend continuing Vital 1.2 and advancing daily with daily tolerance assessment to goal of 75 mL/hr to prevent further caloric debt while decision for route is made.   Achieving goal rate on continuous feeds with tolerance would also benefit pt in ability to potentially transition to bolus feeds in the future.   Please consult RD if changes to current regimen warranted.    Brynda Greathouse, MS RD LDN Clinical Inpatient Dietitian Pager: [phone] Weekend/After hours pager: [phone]

## 2012-04-27 NOTE — Progress Notes (Signed)
[facility] Progress Note [place] [name]   ARAM CADA ONG:[id number] DOB: 1960-10-14 DOA: 04/25/2012 PCP: No primary provider on file.  Brief narrative: 52 year old male patient with past medical history of ulcerative colitis and prior diverticular perforation. He was admitted to [facility] on 02/26/2012 with C. difficile colitis. He initially underwent exploratory laparotomy for a cecostomy tube placement by Dr. Dian Situ on 02/27/2012 because of colonic stricture. 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 Because of septic shock with acute renal failure and Atrial fib with RVR he was transferred to [facility] 03/01/2012. He required acute dialysis/CVVHD and was followed by pulmonary critical care medicine for his sepsis and shock that required pressors. CCS was consulted in regards to the surgical problems. He developed wound evisceration and underwent multiple procedures with eventual closure and placement of retention sutures on 04/01/2012 by Dr. Johna Sheriff. 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  There was also question of heparin induced thrombocytopenia. He was transitioned to Argatroban and developed a GI bleed on 04/02/2012. He had persistent lower extremity edema but Dopplers were negative for DVT. He has a combination of anemia of critical illness as well as acute blood loss anemia and has received multiple packed red blood cells this admission with most recent hemoglobin of >7.0. Because of diffuse weakness, especially right upper extremity weakness, neurology was consulted. 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 Dr. Pearlean Brownie recommended aspirin initially with  transition to anticoagulation once thrombocytopenia and transaminitis resolved. Because of patient's difficulty swallowing and tolerating enteral feeds initiation of oral medications including anticoagulation has not been limited. His feeding tube was initially discontinued on 04/16/2012 and his diet was advanced to dysphagia 3. 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 PT and OT recommended patient proceed with inpatient rehabilitation and he was admitted to that unit on 04/17/2012.  After admission to the rehabilitation unit 04/21/2012 the patient developed nausea & vomiting and chest x-ray revealed a right lower lobe pneumonia likely related to aspiration. He also had associated leukocytosis. Critical care medicine was reconsult at and they recommended beginning vancomycin Azactam for possible healthcare associated pneumonia.  Unfortunately despite attempts to allow for oral intake he has had significant anorexia and poor intake. Surgery has continued to follow patient and because of anorexia and vomiting there was a suspicion for an ileus. CT scan of the abdomen revealed a loculated left upper quadrant fluid collection. Subsequently Dr. Michaell Cowing with the surgical team debrided the abdominal wound and removed the bolsters and retention sutures on 04/24/2012. A nasogastric tube was replaced on 04/24/2012 as well without any improvement in his GI symptoms despite being to intermittent suction and draining bile. Unfortunately his white count continued to rise (now 15,600) despite having recently been on antibiotics. Because of multiple medical problems that would interfere with successful pursuit of rehabilitative therapy the patient was transferred to the acute care hospital 04/25/2012. Team 1 assumed care of the patient on 04/26/2012.  Assessment/Plan:  Pleural effusion, L > R w/ HCAP vs Aspiration  pneumonitis -appreciate PCCM assistance - mobilizing and CXR only if new sx's -anbx's dc/d 4/2 since completed 5 days of tx -NOT hypoxic so this is encouraging -  suspect severe PCM contributing to fluid accumulations including pleural effusions  Acute renal failure on CKD (chronic kidney disease) stage 2, GFR 60-89 ml/min -Scr stable with great UOP - GFR now normal     Anemia of chronic disease & of critical illness -hgb stable although baseline around 9.5 -hemodynamically stable and no h.o CAD so no indications to pursue transfusion at this time -cont iron  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 -improved with aggressive PT/OT in CIR -cont therapies while in SDU - wife assisting with exercises -return to CIR when medically stable  S/p total colectomy for toxic megacolon, C diff -per CCS - no evidence of intra-abdominal abscess/infection -has wound VAC  CVA, 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 -eval by Neuro initially -RUE weakness likely due to shoulder injury (right shoulder subluxation) -Start per tube ASA per previous Neuro recs once PEG placed -once tolerates enteral feeds/PO intake will need to start Plavix  Hypokalemia -replete and follow lytes  PAF (paroxysmal atrial fibrillation) -resolved  Protein-calorie malnutrition, severe / Dehydration -high risk for recurrent DH due to ileostomy- cont IVF -cont imodium to decrease ileo output -Malnutrition major factor contributing to slow recovery/wound healing incluiding recurrent fluid collections/he is post paracentesis 4/2 -CCS recs PEG placement -if does not tolerate TF CCS recs TNA  Spleen hematoma - subcapsular & without rupture / Intra-abdominal fluid collection/LUQ -CCS: nothing to do for spleen/self limiting  Recent hyperglycemia -cont SSI -check HgbA1c- no prior h/o diabetes   DVT prophylaxis: SCDs Code Status: Full Family  Communication: Patient and wife at bedside Disposition Plan: Stepdown Isolation: None  Consultants: CCS Pulmonary medicine  Procedures: 2/03- Flex sig 2/03 - Decompressive Cecostomy due to colonic stricture and megacolon 2/04 - Total abdominal colectomy and end ileostomy for toxic megacolon 2/09- Thoracentesis 2/26-Cystoscpy w/ BRP 3/03-EL/Liver bx/appliation wound VAC 3/06-EL/open abd wound VAC change 3/09-removal VAC dressing and abdominal closure   Antibiotics: PCN, Imipenem ALLERGIC - rash S/p full treatment x 3 weeks for cdiff  Aztreonam 3/29>>> 3/31 vanc 3/29>>> 3/31   HPI/Subjective: Patient sleeping today. Wife at bedside.  Objective: Blood pressure 108/66, pulse 114, temperature 97.6 F (36.4 C), temperature source Axillary, resp. rate 16, height 5\' 9"  (1.753 m), weight 64.774 kg (142 lb 12.8 oz), SpO2 100.00%.  Intake/Output Summary (Last 24 hours) at 04/27/12 1207 Last data filed at 04/27/12 1100  Gross per 24 hour  Intake   1020 ml  Output    650 ml  Net    370 ml   Exam: General: No acute respiratory distress Lungs: Clear to auscultation bilaterally without wheezes or crackles, RA Cardiovascular: Regular rate and rhythm without murmur gallop or rub normal S1 and S2, no peripheral edema or JVD Abdomen: Nontender, nondistended, soft, hypoactive bowel sounds, no rebound, no ascites, no appreciable mass; ileostomy stoma stable without evidence of bleeding; nasogastric tube in place with trickle tube feedings at 10 cc per hour; wound VAC over midline abdominal incision Musculoskeletal: No significant cyanosis, clubbing of bilateral lower extremities Neurological: limited by pt sleeping- wife states no changes overnight  Data Reviewed: Basic Metabolic Panel:  Recent Labs Lab 04/23/12 0715 04/24/12 1441 04/25/12 0005 04/26/12 0600 04/27/12 0500  NA 137 137 136 140 143  K 3.9 3.9 3.6 3.3* 3.1*  CL 108 106 107 112 114*  CO2 19 19 20  16* 19  GLUCOSE 95  77 77 74 117*  BUN 31* 28* 25* 22 19  CREATININE 1.67* 1.61* 1.54* 1.34 1.21  CALCIUM 8.1* 8.1* 8.1* 7.8* 8.0*  MG  --   --  1.6  --   --   PHOS  --   --  3.7  --   --    Liver Function Tests:  Recent Labs Lab 04/21/12 0840 04/24/12 1441 04/25/12 0005 04/26/12 0600  AST 19 30 37 27  ALT 24 27 28 23   ALKPHOS 297* 291* 294* 296*  BILITOT 1.5* 1.3* 1.2 1.0  PROT 5.6* 5.2* 5.0* 4.8*  ALBUMIN 1.6* 1.4* 1.4* 1.2*   CBC:  Recent Labs Lab 04/21/12 0840 04/23/12 0715 04/24/12 1441 04/25/12 0005 04/26/12 0600 04/27/12 0500  WBC 14.7* 13.4* 15.6* 16.7* 15.6* 16.3*  NEUTROABS 13.1*  --   --  13.8*  --   --   HGB 7.8* 7.3* 8.2* 8.0* 7.8* 7.7*  HCT 24.7* 21.9* 24.4* 23.9* 23.5* 24.0*  MCV 98.0 93.6 92.1 91.9 91.8 93.0  PLT 350 341 384 403* 364 364   BNP (last 3 results)  Recent Labs  04/10/12 0650  PROBNP 239.6*   CBG:  Recent Labs Lab 04/26/12 0741 04/26/12 1213 04/26/12 1702 04/26/12 2124 04/27/12 0757  GLUCAP 85 79 89 96 117*    Recent Results (from the past 240 hour(s))  CULTURE, BLOOD (ROUTINE X 2)     Status: None   Collection Time    04/21/12  8:40 AM      Result Value Range Status   Specimen Description BLOOD LEFT ARM   Final   Special Requests BOTTLES DRAWN AEROBIC AND ANAEROBIC 10CC   Final   Culture  Setup Time 04/21/2012 18:02   Final   Culture NO GROWTH 5 DAYS   Final   Report Status 04/27/2012 FINAL   Final  CULTURE, BLOOD (ROUTINE X 2)     Status: None   Collection Time    04/21/12  8:50 AM      Result Value Range Status   Specimen Description BLOOD LEFT ARM   Final   Special Requests BOTTLES DRAWN AEROBIC ONLY 10CC   Final   Culture  Setup Time 04/21/2012 18:02   Final   Culture NO GROWTH 5 DAYS   Final   Report Status 04/27/2012 FINAL   Final  BODY FLUID CULTURE     Status: None   Collection Time    04/25/12 12:25 PM      Result Value Range Status   Specimen Description FLUID PERITONEAL   Final   Special Requests NONE   Final    Gram Stain     Final   Value: NO WBC SEEN     NO ORGANISMS SEEN   Culture NO GROWTH 2 DAYS   Final   Report Status PENDING   Incomplete  CULTURE, FUNGUS WITHOUT SMEAR     Status: None   Collection Time    04/25/12 12:26 PM      Result Value Range Status   Specimen Description FLUID PERITONEAL   Final   Special Requests FLUID   Final   Culture CULTURE IN PROGRESS FOR FOUR WEEKS   Final   Report Status PENDING   Incomplete     Studies:  Recent x-ray studies have been reviewed in detail by the Attending Physician  Scheduled Meds:  Reviewed in detail by the Attending Physician   Junious Silk, ANP [facility] Office  [phone] Pager [phone]  On-Call/Text Page:      [email or web address]      password [email or web address]  If 7PM-7AM, please contact night-coverage  [email or web address] Password [email or web address] 04/27/2012, 12:07 PM   LOS: 2 days   I have personally examined this patient and reviewed the entire database. I have reviewed the above note, made any necessary editorial changes, and agree with its content.  Lonia Blood, MD [facility]

## 2012-04-27 NOTE — Progress Notes (Addendum)
Connor Small [id number] [birth year]  CARE TEAM:  PCP: No primary provider on file.  Outpatient Care Team: Patient has no care team.  Inpatient Treatment Team: Treatment Team: Attending Provider: Lonia Blood, MD; Rounding Team: Sheppard Penton, MD; Consulting Physician: Bishop Limbo, MD; Technician: Henry Russel, NT   Subjective:  Perc drainage done - c/w ascites Remains in SD unit Pt resting in bed but more alert Wife/family in room  Objective:  Vital signs:  Filed Vitals:   04/27/12 0752 04/27/12 0800 04/27/12 0900 04/27/12 1100  BP: 99/60 94/60 100/62 108/66  Pulse: 112 110 114 114  Temp: 97.6 F (36.4 C)     TempSrc: Axillary     Resp: 13 13 17 16   Height:      Weight:      SpO2: 100%       Last BM Date: 04/26/12  Intake/Output   Yesterday:  04/03 0701 - 04/04 0700 In: 1345 [I.V.:815; NG/GT:530] Out: 750 [Urine:550; Stool:200] This shift:  Total I/O In: 160 [I.V.:40; NG/GT:120] Out: -   Bowel function:  Flatus: more today  BM: y, thinner in bag  Drain: n/a  Physical Exam:  General: Pt much more awake & alert, oriented x4 in no acute distress.  Cachectic Eyes: PERRL, normal EOM.  Sclera clear.  No icterus Neuro: CN II-XII intact w/o focal sensory/motor deficits. Lymph: No head/neck/groin lymphadenopathy Psych:  No delerium/psychosis/paranoia.  More alert & interactive today but speaks very softly HENT: Normocephalic, Mucus membranes moist.  No thrush.  NGT in place Neck: Supple, No tracheal deviation Chest: No chest wall pain w good excursion CV:  Pulses intact.  Regular rhythm MS: Normal AROM mjr joints.  No obvious deformity Abdomen: Soft.  Nondistended.  Nontender at incisions only.  No evidence of peritonitis.  No incarcerated hernias.  Wound vac in place.  ileostomy pnk Ext:  SCDs BLE.  No mjr edema.  No cyanosis Skin: No petechiae / purpura  Results:   Labs: Results for orders placed during the hospital encounter of 04/25/12  (from the past 48 hour(s))  GLUCOSE, CAPILLARY     Status: None   Collection Time    04/25/12  5:11 PM      Result Value Range   Glucose-Capillary 70  70 - 99 mg/dL   Comment 1 Notify RN    GLUCOSE, CAPILLARY     Status: None   Collection Time    04/25/12 10:37 PM      Result Value Range   Glucose-Capillary 74  70 - 99 mg/dL   Comment 1 Notify RN    CBC     Status: Abnormal   Collection Time    04/26/12  6:00 AM      Result Value Range   WBC 15.6 (*) 4.0 - 10.5 K/uL   RBC 2.56 (*) 4.22 - 5.81 MIL/uL   Hemoglobin 7.8 (*) 13.0 - 17.0 g/dL   HCT 40.9 (*) 81.1 - 91.4 %   MCV 91.8  78.0 - 100.0 fL   MCH 30.5  26.0 - 34.0 pg   MCHC 33.2  30.0 - 36.0 g/dL   RDW 78.2 (*) 95.6 - 21.3 %   Platelets 364  150 - 400 K/uL  COMPREHENSIVE METABOLIC PANEL     Status: Abnormal   Collection Time    04/26/12  6:00 AM      Result Value Range   Sodium 140  135 - 145 mEq/L   Potassium 3.3 (*) 3.5 - 5.1  mEq/L   Chloride 112  96 - 112 mEq/L   CO2 16 (*) 19 - 32 mEq/L   Glucose, Bld 74  70 - 99 mg/dL   BUN 22  6 - 23 mg/dL   Creatinine, Ser 1.61  0.50 - 1.35 mg/dL   Calcium 7.8 (*) 8.4 - 10.5 mg/dL   Total Protein 4.8 (*) 6.0 - 8.3 g/dL   Albumin 1.2 (*) 3.5 - 5.2 g/dL   AST 27  0 - 37 U/L   ALT 23  0 - 53 U/L   Alkaline Phosphatase 296 (*) 39 - 117 U/L   Total Bilirubin 1.0  0.3 - 1.2 mg/dL   GFR calc non Af Amer 59 (*) >90 mL/min   GFR calc Af Amer 68 (*) >90 mL/min   Comment:            The eGFR has been calculated     using the CKD EPI equation.     This calculation has not been     validated in all clinical     situations.     eGFR's persistently     <90 mL/min signify     possible Chronic Kidney Disease.  GLUCOSE, CAPILLARY     Status: None   Collection Time    04/26/12  7:41 AM      Result Value Range   Glucose-Capillary 85  70 - 99 mg/dL   Comment 1 Documented in Chart    GLUCOSE, CAPILLARY     Status: None   Collection Time    04/26/12 12:13 PM      Result Value Range    Glucose-Capillary 79  70 - 99 mg/dL  HEMOGLOBIN W9U     Status: None   Collection Time    04/26/12  2:17 PM      Result Value Range   Hemoglobin A1C 5.0  <5.7 %   Comment: (NOTE)                                                                               According to the ADA Clinical Practice Recommendations for 2011, when     HbA1c is used as a screening test:      >=6.5%   Diagnostic of Diabetes Mellitus               (if abnormal result is confirmed)     5.7-6.4%   Increased risk of developing Diabetes Mellitus     References:Diagnosis and Classification of Diabetes Mellitus,Diabetes     Care,2011,34(Suppl 1):S62-S69 and Standards of Medical Care in             Diabetes - 2011,Diabetes Care,2011,34 (Suppl 1):S11-S61.   Mean Plasma Glucose 97  <117 mg/dL  GLUCOSE, CAPILLARY     Status: None   Collection Time    04/26/12  5:02 PM      Result Value Range   Glucose-Capillary 89  70 - 99 mg/dL  GLUCOSE, CAPILLARY     Status: None   Collection Time    04/26/12  9:24 PM      Result Value Range   Glucose-Capillary 96  70 - 99 mg/dL  CBC  Status: Abnormal   Collection Time    04/27/12  5:00 AM      Result Value Range   WBC 16.3 (*) 4.0 - 10.5 K/uL   RBC 2.58 (*) 4.22 - 5.81 MIL/uL   Hemoglobin 7.7 (*) 13.0 - 17.0 g/dL   HCT 09.8 (*) 11.9 - 14.7 %   MCV 93.0  78.0 - 100.0 fL   MCH 29.8  26.0 - 34.0 pg   MCHC 32.1  30.0 - 36.0 g/dL   RDW 82.9 (*) 56.2 - 13.0 %   Platelets 364  150 - 400 K/uL  BASIC METABOLIC PANEL     Status: Abnormal   Collection Time    04/27/12  5:00 AM      Result Value Range   Sodium 143  135 - 145 mEq/L   Potassium 3.1 (*) 3.5 - 5.1 mEq/L   Chloride 114 (*) 96 - 112 mEq/L   CO2 19  19 - 32 mEq/L   Glucose, Bld 117 (*) 70 - 99 mg/dL   BUN 19  6 - 23 mg/dL   Creatinine, Ser 8.65  0.50 - 1.35 mg/dL   Calcium 8.0 (*) 8.4 - 10.5 mg/dL   GFR calc non Af Amer 66 (*) >90 mL/min   GFR calc Af Amer 77 (*) >90 mL/min   Comment:            The eGFR  has been calculated     using the CKD EPI equation.     This calculation has not been     validated in all clinical     situations.     eGFR's persistently     <90 mL/min signify     possible Chronic Kidney Disease.  GLUCOSE, CAPILLARY     Status: Abnormal   Collection Time    04/27/12  7:57 AM      Result Value Range   Glucose-Capillary 117 (*) 70 - 99 mg/dL   Comment 1 Documented in Chart     Comment 2 Notify RN      Imaging / Studies: US Paracentesis  04/25/2012  *RADIOLOGY REPORT*  Clinical Data: Patient status post decompressive cecostomy/ileostomy for toxic megacolon/c diff, renal insufficiency, ascites.  Request is made for diagnostic and therapeutic paracentesis.  ULTRASOUND GUIDED DIAGNOSTIC AND THERAPEUTIC  PARACENTESIS  An ultrasound guided paracentesis was thoroughly discussed with the patient and questions answered.  The benefits, risks, alternatives and complications were also discussed.  The patient understands and wishes to proceed with the procedure.  Written consent was obtained.  Ultrasound was performed to localize and mark an adequate pocket of fluid in the left lower quadrant of the abdomen.  The area was then prepped and draped in the normal sterile fashion.  1% Lidocaine was used for local anesthesia.  Under ultrasound guidance a 19 gauge Yueh catheter was introduced.  Paracentesis was performed.  The catheter was removed and a dressing applied.  Complications:  none  Findings:  A total of approximately 2.9 liters of clear,yellow fluid was removed.  A fluid sample was sent for culture.  IMPRESSION: Successful ultrasound guided diagnostic and therapeutic paracentesis yielding 2.9 liters of ascites.  Read by: Jeananne Rama, P.A.-C   Original Report Authenticated By: Judie Petit. Miles Costain, M.D.     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 male       Failure to thrive with severe malnutrition  No evidence of intraabdominal abscess/infection  Plan:  With ileostomy, at risk for dehydration.   Loperamide QHS for ileostomy regimen to avoid massive GI losses  WOCN ostomy nurses help appreciated.  Continue wound vac for wound to help with granulation/healing.  CT scan shows ascites & persistent subcapsular hematoma of spleen.  Leave spleen alone.  S/p tap peritoneal fluid to r/o infection but most likely a reflection of his SEVERE protein calorie malnutrition.  Cultures pending  Severe malnutrition with albumin <2 - this is the major stumbling block on his road to recovery.   Hopefully that can be turned around.  I think at this point, he would benefit from a PEG.  There seems to be a good window in the left upper quadrant to place percutaneously by interventional radiology.  Would avoid open gastrostomy tube placement given hostile abdomen.  Already had a Corpak for a while.  Would be more likely getting his enteral medications and easy to feed bolus him, weaning off & remove the PEG at a later time when he is better.  More options for placement with PEG than with a  chronic Corpak.  The patient and his wife are hesitant to proceed with this.  They want to wait a few days and think about it early next week.  I will respect their wishes.  With the ileostomy, risk for diarrhea high-volume.  Would consider cycling tube feeds at night and using a high color regimen to minimize diarrhea issues.  Pepto and Imodium when necessary In order to avoid severe diarrhea and dehydration.  Continue iron as it is constipating as well.  If not tolerating enteral route, start TNA.   Supp shakes as tolerated. Calorie counts.   -VTE prophylaxis- SCDs, etc  -mobilize as tolerated to help recovery  Issues are stable for now.  Will followup on Monday.  Call us if they are more concerning issues over the weekend.  Ardeth Sportsman, M.D., F.A.C.S. Gastrointestinal and Minimally Invasive Surgery [facility] [address] [place], [place] [address] [phone] Main / Paging   04/27/2012

## 2012-04-28 ENCOUNTER — Inpatient Hospital Stay (HOSPITAL_COMMUNITY): Payer: BC Managed Care – PPO

## 2012-04-28 DIAGNOSIS — E46 Unspecified protein-calorie malnutrition: Secondary | ICD-10-CM

## 2012-04-28 LAB — FOLATE: Folate: 13.3 ng/mL

## 2012-04-28 LAB — RENAL FUNCTION PANEL
BUN: 19 mg/dL (ref 6–23)
CO2: 21 mEq/L (ref 19–32)
Chloride: 115 mEq/L — ABNORMAL HIGH (ref 96–112)
Creatinine, Ser: 1.06 mg/dL (ref 0.50–1.35)

## 2012-04-28 LAB — IRON AND TIBC
Iron: 15 ug/dL — ABNORMAL LOW (ref 42–135)
Saturation Ratios: 13 % — ABNORMAL LOW (ref 20–55)
UIBC: 102 ug/dL — ABNORMAL LOW (ref 125–400)

## 2012-04-28 LAB — RETICULOCYTES: Retic Count, Absolute: 61.7 10*3/uL (ref 19.0–186.0)

## 2012-04-28 LAB — VITAMIN B12: Vitamin B-12: 416 pg/mL (ref 211–911)

## 2012-04-28 LAB — CBC
HCT: 24 % — ABNORMAL LOW (ref 39.0–52.0)
Hemoglobin: 7.8 g/dL — ABNORMAL LOW (ref 13.0–17.0)
MCV: 93.4 fL (ref 78.0–100.0)
RBC: 2.57 MIL/uL — ABNORMAL LOW (ref 4.22–5.81)
WBC: 16.9 10*3/uL — ABNORMAL HIGH (ref 4.0–10.5)

## 2012-04-28 LAB — BODY FLUID CULTURE: Gram Stain: NONE SEEN

## 2012-04-28 LAB — GLUCOSE, CAPILLARY: Glucose-Capillary: 90 mg/dL (ref 70–99)

## 2012-04-28 MED ORDER — GLUCAGON HCL (RDNA) 1 MG IJ SOLR
INTRAMUSCULAR | Status: AC | PRN
  Administered 2012-04-28: .5 mg via INTRAVENOUS

## 2012-04-28 MED ORDER — MIDAZOLAM HCL 2 MG/2ML IJ SOLN
INTRAMUSCULAR | Status: AC | PRN
  Administered 2012-04-28: 1 mg via INTRAVENOUS

## 2012-04-28 MED ORDER — MIDAZOLAM HCL 5 MG/5ML IJ SOLN
INTRAMUSCULAR | Status: AC | PRN
  Administered 2012-04-28: 1 mg via INTRAVENOUS

## 2012-04-28 MED ORDER — SODIUM CHLORIDE 0.9 % IV BOLUS (SEPSIS)
500.0000 mL | Freq: Once | INTRAVENOUS | Status: AC
  Administered 2012-04-28: 500 mL via INTRAVENOUS

## 2012-04-28 MED ORDER — IOHEXOL 300 MG/ML  SOLN
50.0000 mL | Freq: Once | INTRAMUSCULAR | Status: AC | PRN
  Administered 2012-04-28: 10 mL via INTRAVENOUS

## 2012-04-28 MED ORDER — FENTANYL CITRATE 0.05 MG/ML IJ SOLN
INTRAMUSCULAR | Status: AC | PRN
  Administered 2012-04-28 (×2): 25 ug via INTRAVENOUS

## 2012-04-28 NOTE — ED Notes (Signed)
Paracentesis performed 750 cc fluid removed prior to [facility] Tube placement.

## 2012-04-28 NOTE — Progress Notes (Signed)
MEDICATION RELATED CONSULT NOTE - F/U   Pharmacy Consult for Aranesp Indication: Anemia of Chronic Illness  Allergies  Allergen Reactions  . Penicillins Other (See Comments)    Heart rate changes  . Morphine And Related     Nausea/vomiting  . Oxycodone     Nausea/vomiting  . Imipenem Rash    Patient Measurements: Height: 5' 9"  (175.3 cm) Weight: 139 lb 11.2 oz (63.368 kg) IBW/kg (Calculated) : 70.7  Vital Signs: Temp: 98.3 F (36.8 C) (04/05 0745) Temp src: Oral (04/05 0500) BP: 110/76 mmHg (04/05 1235) Pulse Rate: 137 (04/05 1235) Intake/Output from previous day: 04/04 0701 - 04/05 0700 In: 730 [I.V.:220; NG/GT:510] Out: 1175 [Stool:1175] Intake/Output from this shift: Total I/O In: 260 [I.V.:60; IV Piggyback:200] Out: -   Labs:  Recent Labs  04/26/12 0600 04/27/12 0500 04/27/12 1137 04/28/12 0602  WBC 15.6* 16.3*  --  16.9*  HGB 7.8* 7.7*  --  7.8*  HCT 23.5* 24.0*  --  24.0*  PLT 364 364  --  351  CREATININE 1.34 1.21 1.16 1.06  PHOS  --   --   --  2.2*  ALBUMIN 1.2*  --  1.3* 1.2*  PROT 4.8*  --  4.9*  --   AST 27  --  22  --   ALT 23  --  19  --   ALKPHOS 296*  --  295*  --   BILITOT 1.0  --  1.0  --    Estimated Creatinine Clearance: 71.4 ml/min (by C-G formula based on Cr of 1.06).   Microbiology: Recent Results (from the past 720 hour(s))  CULTURE, EXPECTORATED SPUTUM-ASSESSMENT     Status: None   Collection Time    04/06/12  1:00 PM      Result Value Range Status   Specimen Description SPUTUM   Final   Special Requests Normal   Final   Sputum evaluation     Final   Value: THIS SPECIMEN IS ACCEPTABLE. RESPIRATORY CULTURE REPORT TO FOLLOW.   Report Status 04/06/2012 FINAL   Final  CULTURE, RESPIRATORY (NON-EXPECTORATED)     Status: None   Collection Time    04/06/12  1:00 PM      Result Value Range Status   Specimen Description SPUTUM   Final   Special Requests NONE   Final   Gram Stain     Final   Value: FEW WBC PRESENT,BOTH PMN  AND MONONUCLEAR     MODERATE SQUAMOUS EPITHELIAL CELLS PRESENT     ABUNDANT GRAM POSITIVE COCCI IN CLUSTERS     IN PAIRS IN CHAINS   Culture NORMAL OROPHARYNGEAL FLORA   Final   Report Status 04/08/2012 FINAL   Final  URINE CULTURE     Status: None   Collection Time    04/10/12  2:49 PM      Result Value Range Status   Specimen Description URINE, CLEAN CATCH   Final   Special Requests NONE   Final   Culture  Setup Time 04/10/2012 15:12   Final   Colony Count 30,000 COLONIES/ML   Final   Culture ENTEROBACTER CLOACAE   Final   Report Status 04/14/2012 FINAL   Final   Organism ID, Bacteria ENTEROBACTER CLOACAE   Final  CULTURE, BLOOD (ROUTINE X 2)     Status: None   Collection Time    04/21/12  8:40 AM      Result Value Range Status   Specimen Description BLOOD LEFT ARM  Final   Special Requests BOTTLES DRAWN AEROBIC AND ANAEROBIC 10CC   Final   Culture  Setup Time 04/21/2012 18:02   Final   Culture NO GROWTH 5 DAYS   Final   Report Status 04/27/2012 FINAL   Final  CULTURE, BLOOD (ROUTINE X 2)     Status: None   Collection Time    04/21/12  8:50 AM      Result Value Range Status   Specimen Description BLOOD LEFT ARM   Final   Special Requests BOTTLES DRAWN AEROBIC ONLY 10CC   Final   Culture  Setup Time 04/21/2012 18:02   Final   Culture NO GROWTH 5 DAYS   Final   Report Status 04/27/2012 FINAL   Final  BODY FLUID CULTURE     Status: None   Collection Time    04/25/12 12:25 PM      Result Value Range Status   Specimen Description FLUID PERITONEAL   Final   Special Requests NONE   Final   Gram Stain     Final   Value: NO WBC SEEN     NO ORGANISMS SEEN   Culture NO GROWTH 3 DAYS   Final   Report Status 04/28/2012 FINAL   Final  CULTURE, FUNGUS WITHOUT SMEAR     Status: None   Collection Time    04/25/12 12:26 PM      Result Value Range Status   Specimen Description FLUID PERITONEAL   Final   Special Requests 60ML FLUID   Final   Culture CULTURE IN PROGRESS FOR FOUR  WEEKS   Final   Report Status PENDING   Incomplete    Medical History: Past Medical History  Diagnosis Date  . Chronic diarrhea   . Rectal bleed   . Hemorrhoids   . Ulcerative colitis     Distal UC over 8 yrs ago diagnosed  . Diverticulitis of large intestine with perforation 10/2011    done at [facility]  . 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 history of UC (ulcerative colitis) 12/06/2010    Medications:  Scheduled:  . aspirin  81 mg Oral Daily  . chlorhexidine  15 mL Mouth Rinse BID  . darbepoetin (ARANESP) injection - NON-DIALYSIS  40 mcg Subcutaneous Q Sat-1800  . feeding supplement (VITAL AF 1.2 CAL)  1,000 mL Per Tube Q24H  . ferrous sulfate  300 mg Per Tube TID WC  . insulin aspart  0-5 Units Subcutaneous QHS  . insulin aspart  0-9 Units Subcutaneous TID WC  . lidocaine  1 patch Transdermal Q24H  . lip balm   Topical BID  . loperamide  2 mg Oral QHS  . multivitamin  5 mL Per Tube Daily  . pantoprazole sodium  40 mg Per Tube Q1200  . potassium chloride  20 mEq Oral BID  . prochlorperazine  10 mg Intravenous Q6H  . saccharomyces boulardii  250 mg Oral BID  . [COMPLETED] vancomycin  1,000 mg Intravenous On Call  . [DISCONTINUED] aspirin  81 mg Per NG tube Daily  . [DISCONTINUED] potassium chloride  20 mEq Oral BID  . [DISCONTINUED] potassium chloride  20 mEq Per Tube BID    Assessment: 52 year old male who has been hospitalized since 02/26/12.  His hemoglobin is currently below his baseline of 9.5, and Aranesp is requested by [facility].  He is currently receiving Ferrous Sulfate po TID. Iron studies resulted today with a low iron TSAT <20%.   Goal of Therapy:  Hgb  increase of at least 1 gm/dL after 4 weeks of Aranesp  Plan:  Consider starting IV iron as Aranesp will not increase Hgb if patient is iron depleted  Aranesp 12mg SQ qweek to start on today, 4/5 Check CBC and Reticulocyte count at least weekly while on Aranesp.   Thank you, SVertis Kelch Pharm.D. Clinical  Pharmacist   Pager: [phone]/05/2012 1:01 PM

## 2012-04-28 NOTE — Progress Notes (Signed)
[facility]/Amra Shukla Progress Note    Subjective: Patient afraid of procedure today, but I explained it to him and he feels more comfortable.  Getting percutaneous gastrostomy tube in IR today.  Objective: Vital signs in last 24 hours: Temp:  [97.9 F (36.6 C)-98.4 F (36.9 C)] 98.3 F (36.8 C) (04/05 0745) Pulse Rate:  [108-123] 111 (04/05 0745) Resp:  [13-20] 14 (04/05 0745) BP: (92-115)/(52-72) 108/67 mmHg (04/05 0745) SpO2:  [96 %-99 %] 97 % (04/05 0745) Weight:  [63.368 kg (139 lb 11.2 oz)] 63.368 kg (139 lb 11.2 oz) (04/05 0500) Last BM Date: 04/26/12  Intake/Output from previous day: 04/04 0701 - 04/05 0700 In: 730 [I.V.:220; NG/GT:510] Out: 1175 [Stool:1175] Intake/Output this shift: Total I/O In: 240 [I.V.:40; IV Piggyback:200] Out: -   General: No acute distress.  Lungs: Clear  Abd: Flat, benign.  NGT seems to be in very far (maybe too deep)  Extremities: Muscule strophy, minimal edema  Neuro: Intact  Lab Results:  @LABLAST2 (wbc:2,hgb:2,hct:2,plt:2) BMET  Recent Labs  04/27/12 1137 04/28/12 0602  NA 144 144  K 3.3* 3.7  CL 115* 115*  CO2 19 21  GLUCOSE 128* 96  BUN 20 19  CREATININE 1.16 1.06  CALCIUM 7.9* 8.1*   PT/INR No results found for this basename: LABPROT, INR,  in the last 72 hours ABG No results found for this basename: PHART, PCO2, PO2, HCO3,  in the last 72 hours  Studies/Results: No results found.  Anti-infectives: Anti-infectives   Start     Dose/Rate Route Frequency Ordered Stop   04/28/12 0800  vancomycin (VANCOCIN) IVPB 1000 mg/200 mL premix     1,000 mg 200 mL/hr over 60 Minutes Intravenous On call 04/27/12 1723 04/28/12 0919   04/25/12 2200  aztreonam (AZACTAM) 1 g in dextrose 5 % 50 mL IVPB  Status:  Discontinued     1 g 100 mL/hr over 30 Minutes Intravenous 3 times per day 04/25/12 1625 04/25/12 1636      Assessment/Plan: s/p  Follow after G-tube Get adequate nutrition.  LOS: 3 days   Kathryne Eriksson. Dahlia Bailiff, MD,  FACS [phone] [facility] 04/28/2012

## 2012-04-28 NOTE — Progress Notes (Signed)
[facility] Progress Note [place] [name]   Connor Small YQM:[id number] DOB: 29-Jan-1960 DOA: 04/25/2012 PCP: No primary provider on file.  Brief narrative: 52 year old male patient with past medical history of ulcerative colitis and prior diverticular perforation. He was admitted to [facility] on 02/26/2012 with C. difficile colitis. He initially underwent exploratory laparotomy for a cecostomy tube placement by Dr. Dian Situ on 02/27/2012 because of colonic stricture. 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 Because of septic shock with acute renal failure and Atrial fib with RVR he was transferred to [facility] 03/01/2012. He required acute dialysis/CVVHD and was followed by pulmonary critical care medicine for his sepsis and shock that required pressors. CCS was consulted in regards to the surgical problems. He developed wound evisceration and underwent multiple procedures with eventual closure and placement of retention sutures on 04/01/2012 by Dr. Johna Sheriff. 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  There was also question of heparin induced thrombocytopenia. He was transitioned to Argatroban and developed a GI bleed on 04/02/2012. He had persistent lower extremity edema but Dopplers were negative for DVT. He has a combination of anemia of critical illness as well as acute blood loss anemia and has received multiple packed red blood cells this admission with most recent hemoglobin of >7.0. Because of diffuse weakness, especially right upper extremity weakness, neurology was consulted. 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 Dr. Pearlean Brownie recommended aspirin initially with  transition to anticoagulation once thrombocytopenia and transaminitis resolved. Because of patient's difficulty swallowing and tolerating enteral feeds initiation of oral medications including anticoagulation has not been limited. His feeding tube was initially discontinued on 04/16/2012 and his diet was advanced to dysphagia 3. 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 PT and OT recommended patient proceed with inpatient rehabilitation and he was admitted to that unit on 04/17/2012.  After admission to the rehabilitation unit 04/21/2012 the patient developed nausea & vomiting and chest x-ray revealed a right lower lobe pneumonia likely related to aspiration. He also had associated leukocytosis. Critical care medicine was reconsult at and they recommended beginning vancomycin Azactam for possible healthcare associated pneumonia.  Unfortunately despite attempts to allow for oral intake he has had significant anorexia and poor intake. Surgery has continued to follow patient and because of anorexia and vomiting there was a suspicion for an ileus. CT scan of the abdomen revealed a loculated left upper quadrant fluid collection. Subsequently Dr. Michaell Cowing with the surgical team debrided the abdominal wound and removed the bolsters and retention sutures on 04/24/2012. A nasogastric tube was replaced on 04/24/2012 as well without any improvement in his GI symptoms despite being to intermittent suction and draining bile. Unfortunately his white count continued to rise (now 15,600) despite having recently been on antibiotics. Because of multiple medical problems that would interfere with successful pursuit of rehabilitative therapy the patient was transferred to the acute care hospital 04/25/2012. Team 1 assumed care of the patient on 04/26/2012.  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, GFR 60-89 ml/min - resolved -Scr stable with great UOP - GFR now normal     Anemia of chronic disease & of critical illness -hgb stable although baseline around 9.5 -hemodynamically stable and no hx CAD so no indications to pursue transfusion at this time -cont iron  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 -improved with aggressive PT/OT in CIR -cont therapies while in SDU - wife assisting with exercises -return to CIR when medically stable  S/p total colectomy for toxic megacolon, C diff -per CCS - no evidence of intra-abdominal abscess/infection -has wound VAC  CVA, 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 -eval by Neuro initially -RUE weakness likely due to shoulder injury (right shoulder subluxation) -Start per tube ASA per previous Neuro recs once PEG placed -once tolerates enteral feeds/PO intake will need to start Plavix  Hypokalemia -follow lytes  PAF (paroxysmal atrial fibrillation) -resolved  Protein-calorie malnutrition, severe / Dehydration -high risk for recurrent DH due to ileostomy- cont IVF -cont imodium to decrease ileo output -Malnutrition major factor contributing to slow recovery/wound healing incluiding recurrent fluid collections/he is post paracentesis 4/2 -now s/p PEG tube per IR   Spleen hematoma - subcapsular & without rupture / Intra-abdominal fluid collection/LUQ -CCS: nothing to do for spleen/self limiting  Recent hyperglycemia -cont SSI -check HgbA1c- no prior h/o diabetes   DVT prophylaxis: SCDs Code Status: Full Family Communication: Patient and wife at bedside Disposition Plan:  Stepdown Isolation: None  Consultants: CCS Pulmonary medicine  Procedures: 2/03- Flex sig 2/03 - Decompressive Cecostomy due to colonic stricture and megacolon 2/04 - Total abdominal colectomy and end ileostomy for toxic megacolon 2/09- Thoracentesis 2/26-Cystoscpy w/ BRP 3/03-EL/Liver bx/appliation wound VAC 3/06-EL/open abd wound VAC change 3/09-removal VAC dressing and abdominal closure 4/5 - PEG tube per IR    Antibiotics: PCN, Imipenem ALLERGIC - rash S/p full treatment x 3 weeks for cdiff  Aztreonam 3/29>>> 3/31 vanc 3/29>>> 3/31   HPI/Subjective: Patient sleeping post procedure.  Discussed care with wife and daughter at bedside.  Objective: Blood pressure 110/76, pulse 137, temperature 98.3 F (36.8 C), temperature source Oral, resp. rate 19, height 5\' 9"  (1.753 m), weight 63.368 kg (139 lb 11.2 oz), SpO2 100.00%.  Intake/Output Summary (Last 24 hours) at 04/28/12 1309 Last data filed at 04/28/12 1100  Gross per 24 hour  Intake    750 ml  Output    975 ml  Net   -225 ml   Exam: General: No acute respiratory distress Lungs: Clear to auscultation bilaterally without wheezes or crackles Cardiovascular: Regular rate and rhythm without murmur gallop or rub  Abdomen: Nondistended, soft, hypoactive bowel sounds, no rebound, no ascites, no appreciable mass; ileostomy stoma stable without evidence of bleeding; wound VAC over midline abdominal incision Musculoskeletal: No significant cyanosis, clubbing of bilateral lower extremities  Data Reviewed: Basic Metabolic Panel:  Recent Labs Lab 04/25/12 0005 04/26/12 0600 04/27/12 0500 04/27/12 1137 04/28/12 0602  NA 136 140 143 144 144  K 3.6 3.3* 3.1* 3.3* 3.7  CL 107 112 114* 115* 115*  CO2 20 16* 19 19 21   GLUCOSE 77 74 117* 128* 96  BUN 25* 22 19 20 19   CREATININE 1.54* 1.34 1.21 1.16 1.06  CALCIUM 8.1* 7.8* 8.0* 7.9* 8.1*  MG 1.6  --   --   --   --   PHOS 3.7  --   --   --  2.2*   Liver Function  Tests:  Recent Labs Lab 04/24/12 1441 04/25/12 0005 04/26/12 0600 04/27/12 1137 04/28/12 0602  AST 30 37 27 22  --   ALT 27 28 23 19   --   ALKPHOS 291* 294* 296* 295*  --   BILITOT 1.3* 1.2 1.0 1.0  --   PROT 5.2* 5.0* 4.8* 4.9*  --   ALBUMIN 1.4* 1.4* 1.2* 1.3* 1.2*   CBC:  Recent Labs Lab 04/24/12 1441 04/25/12 0005 04/26/12 0600 04/27/12 0500 04/28/12 0602  WBC 15.6* 16.7* 15.6* 16.3* 16.9*  NEUTROABS  --  13.8*  --   --   --   HGB 8.2* 8.0* 7.8* 7.7* 7.8*  HCT 24.4* 23.9* 23.5* 24.0* 24.0*  MCV 92.1 91.9 91.8 93.0 93.4  PLT 384 403* 364 364 351   BNP (last 3 results)  Recent Labs  04/10/12 0650  PROBNP 239.6*   CBG:  Recent Labs Lab 04/27/12 0757 04/27/12 1312 04/27/12 1621 04/27/12 2202 04/28/12 0743  GLUCAP 117* 130* 81 101* 90    Recent Results (from the past 240 hour(s))  CULTURE, BLOOD (ROUTINE X 2)     Status: None   Collection Time    04/21/12  8:40 AM      Result Value Range Status   Specimen Description BLOOD LEFT ARM   Final   Special Requests BOTTLES DRAWN AEROBIC AND ANAEROBIC 10CC   Final   Culture  Setup Time 04/21/2012 18:02   Final   Culture NO GROWTH 5 DAYS   Final   Report Status 04/27/2012 FINAL   Final  CULTURE, BLOOD (ROUTINE X 2)     Status: None   Collection Time    04/21/12  8:50 AM      Result Value Range Status   Specimen Description BLOOD LEFT ARM   Final   Special Requests BOTTLES DRAWN AEROBIC ONLY 10CC   Final   Culture  Setup Time 04/21/2012 18:02   Final   Culture NO GROWTH 5 DAYS   Final   Report Status 04/27/2012 FINAL   Final  BODY FLUID CULTURE     Status: None   Collection Time    04/25/12 12:25 PM      Result Value Range Status   Specimen Description FLUID PERITONEAL   Final   Special Requests NONE   Final   Gram Stain     Final   Value: NO WBC SEEN     NO ORGANISMS SEEN   Culture NO GROWTH 3 DAYS   Final   Report Status 04/28/2012 FINAL   Final  CULTURE, FUNGUS WITHOUT SMEAR     Status: None    Collection Time    04/25/12 12:26 PM      Result Value Range Status   Specimen Description FLUID PERITONEAL   Final   Special Requests FLUID   Final   Culture CULTURE IN PROGRESS FOR FOUR WEEKS   Final   Report Status PENDING   Incomplete     Studies:  Recent x-ray studies have been reviewed in detail by the Attending Physician  Scheduled Meds:  Reviewed in detail by the Attending Physician  Lonia Blood, MD [facility] Office  [phone] Pager [phone]  On-Call/Text Page:      [email or web address]      password [email or web address]  If 7PM-7AM, please contact night-coverage [email or web address] Password [email or web address] 04/28/2012, 1:09 PM   LOS: 3 days

## 2012-04-28 NOTE — Progress Notes (Signed)
PT Cancellation Note  Patient Details Name: Connor Small MRN: [id number] DOB: [birth year]   Cancelled Treatment:    Reason Eval/Treat Not Completed: Medical issues which prohibited therapy;Patient at procedure or test/unavailable (Pt going for PEG placement).   [name],[name] 04/28/2012, 1:28 PM [facility] Acute Rehabilitation [phone] (pager)

## 2012-04-28 NOTE — Progress Notes (Signed)
Pt tolerated peg placement, returning to unit at 13:00. Peg dsg clean peg draining minimal secretion to gravity. RN unable to do scheduled Sat. Wound vac change as it would interrupt sterile surgical peg dsg. Will change wound vac 24 hr post s/p once surgical team have viewed pt and give go ahead for commencement of tube feeding. Pain manageable, pt restfull.

## 2012-04-29 LAB — GLUCOSE, CAPILLARY
Glucose-Capillary: 79 mg/dL (ref 70–99)
Glucose-Capillary: 77 mg/dL (ref 70–99)

## 2012-04-29 LAB — CBC
Hemoglobin: 8.6 g/dL — ABNORMAL LOW (ref 13.0–17.0)
MCHC: 32.3 g/dL (ref 30.0–36.0)
Platelets: 373 10*3/uL (ref 150–400)
RBC: 2.83 MIL/uL — ABNORMAL LOW (ref 4.22–5.81)

## 2012-04-29 MED ORDER — DIPHENHYDRAMINE HCL 12.5 MG/5ML PO ELIX
12.5000 mg | ORAL_SOLUTION | Freq: Four times a day (QID) | ORAL | Status: DC | PRN
  Filled 2012-04-29: qty 5

## 2012-04-29 MED ORDER — OXYCODONE HCL 5 MG/5ML PO SOLN: 5.0000 mg | ORAL | Status: DC | PRN

## 2012-04-29 MED ORDER — METOPROLOL TARTRATE 25 MG/10 ML ORAL SUSPENSION
25.0000 mg | Freq: Two times a day (BID) | ORAL | Status: DC
Start: 2012-04-29 — End: 2012-04-30
  Filled 2012-04-29 (×3): qty 10

## 2012-04-29 MED ORDER — ASPIRIN 81 MG PO CHEW
81.0000 mg | CHEWABLE_TABLET | Freq: Every day | ORAL | Status: DC
  Administered 2012-04-30 – 2012-05-09 (×10): 81 mg
  Filled 2012-04-29 (×10): qty 1

## 2012-04-29 MED ORDER — LOPERAMIDE HCL 1 MG/5ML PO LIQD
2.0000 mg | Freq: Every day | ORAL | Status: DC
  Administered 2012-04-30 – 2012-05-09 (×9): 2 mg
  Filled 2012-04-29 (×12): qty 10

## 2012-04-29 MED ORDER — ACETAMINOPHEN 160 MG/5ML PO SOLN
650.0000 mg | Freq: Four times a day (QID) | ORAL | Status: DC | PRN
  Administered 2012-05-08 – 2012-05-09 (×3): 650 mg
  Filled 2012-04-29 (×4): qty 20.3

## 2012-04-29 MED ORDER — MORPHINE SULFATE 2 MG/ML IJ SOLN
2.0000 mg | INTRAMUSCULAR | Status: DC | PRN
  Administered 2012-04-30: 2 mg via INTRAVENOUS
  Filled 2012-04-29: qty 1

## 2012-04-29 MED ORDER — GUAIFENESIN-DM 100-10 MG/5ML PO SYRP
5.0000 mL | ORAL_SOLUTION | Freq: Four times a day (QID) | ORAL | Status: DC | PRN
  Administered 2012-05-01: 10 mL
  Administered 2012-05-04 (×2): 5 mL
  Filled 2012-04-29 (×3): qty 10

## 2012-04-29 MED ORDER — FREE WATER
150.0000 mL | Status: DC
  Administered 2012-04-29 – 2012-04-30 (×5): 150 mL

## 2012-04-29 NOTE — Evaluation (Addendum)
Physical Therapy Evaluation Patient Details Name: Connor Small MRN: [id number] DOB: 1961-01-10 Today's Date: 04/29/2012 Time: 1500-1520 PT Time Calculation (min): 20 min  PT Assessment / Plan / Recommendation Clinical Impression  Pt s/p colectomy, sepsis and VDRF with multiple complications with PEG tube placed 04/28/12.  Recent admit to Rehab in which pt transferred back to acute due to aspiration PNA.  Pt was willing to sit EOB but only tolerated 5 minutes total at EOB and sat with stand by assist for 2 of the 5 minutes.  Pt needs encouragement to participate.  Will continue PT.  Plan is for pt to return to Rehab once medically stable.      PT Assessment  Patient needs continued PT services    Follow Up Recommendations  CIR;Supervision/Assistance - 24 hour        Barriers to Discharge None      Equipment Recommendations  Other (comment) (TBA)    Recommendations for Other Services Rehab consult   Frequency Min 3X/week    Precautions / Restrictions Precautions Precautions: Shoulder;Fall Type of Shoulder Precautions: R shoulder subluxation Precaution Comments: Abd binder to protect incision, ostomy, tachycardia, Afib Required Braces or Orthoses: Other Brace/Splint Other Brace/Splint: R wrist cock up slint Restrictions Weight Bearing Restrictions: No Other Position/Activity Restrictions: support R UE   Pertinent Vitals/Pain HR up to 141 bpm with sitting EOB, some pain      Mobility  Bed Mobility Bed Mobility: Sit to Supine;Supine to Sit;Rolling Left;Left Sidelying to Sit Rolling Right: Not tested (comment) Rolling Left: 1: +2 Total assist Rolling Left: Patient Percentage: 10% Left Sidelying to Sit: 1: +2 Total assist Left Sidelying to Sit: Patient Percentage: 10% Supine to Sit: 1: +2 Total assist;With rails;HOB flat Supine to Sit: Patient Percentage: 10% Sitting - Scoot to Edge of Bed: 1: +1 Total assist Sitting - Scoot to Edge of Bed: Patient Percentage: 0% Sit to  Supine: 1: +2 Total assist Sit to Supine: Patient Percentage: 0% Scooting to HOB: 1: +2 Total assist Scooting to [facility]: Patient Percentage: 0% Details for Bed Mobility Assistance: cues for sequencing, initiation and follow through due to weakness, assist to flex knees in prep for rolling Transfers Transfers: Not assessed Ambulation/Gait Ambulation/Gait Assistance: Not tested (comment) Stairs: No Wheelchair Mobility Wheelchair Mobility: No         PT Diagnosis: Generalized weakness  PT Problem List: Decreased range of motion;Decreased strength;Decreased activity tolerance;Decreased balance;Decreased mobility;Decreased safety awareness;Decreased knowledge of use of DME;Pain PT Treatment Interventions: DME instruction;Functional mobility training;Therapeutic activities;Therapeutic exercise;Balance training;Neuromuscular re-education;Patient/family education   PT Goals Acute Rehab PT Goals PT Goal Formulation: With patient Time For Goal Achievement: 05/13/12 Potential to Achieve Goals: Good Pt will go Supine/Side to Sit: with +1 total assist PT Goal: Supine/Side to Sit - Progress: Goal set today Pt will Sit at Edge of Bed: with supervision;with unilateral upper extremity support;6-10 min PT Goal: Sit at Edge Of Bed - Progress: Goal set today Pt will go Sit to Stand: with max assist PT Goal: Sit to Stand - Progress: Goal set today Pt will Transfer Bed to Chair/Chair to Bed: with max assist PT Transfer Goal: Bed to Chair/Chair to Bed - Progress: Goal set today Pt will Perform Home Exercise Program: with min assist PT Goal: Perform Home Exercise Program - Progress: Goal set today  Visit Information  Last PT Received On: 04/29/12 Assistance Needed: +2    Subjective Data  Subjective: "I will try." Patient Stated Goal: to get better   Prior Functioning  Home Living Lives With: Spouse Available Help at Discharge: Family;Available 24 hours/day Type of Home: House Home Access: Stairs  to enter [employer] of Steps: 1 step onto porch and one step into house Entrance Stairs-Rails: None Home Layout: One level Bathroom Shower/Tub: Health visitor: Standard Bathroom Accessibility: Yes How Accessible: Accessible via walker Additional Comments: wife fmla, family close by and will help; his brother Prior Function Level of Independence: Independent Able to Take Stairs?: Yes Driving: Yes Vocation: Full time employment Communication Communication: Other (comment);Expressive difficulties Dominant Hand: Right    Cognition  Cognition Overall Cognitive Status: Impaired Area of Impairment: Attention;Problem solving;Memory Difficult to assess due to: Impaired communication Arousal/Alertness: Awake/alert Orientation Level: Disoriented to;Time;Place Behavior During Session: Anxious Current Attention Level: Selective Attention - Other Comments: difficulty attending to session when needs to clear secretions. Memory Deficits: difficulty recalling previous sessions and how he has progressed; does recall being dropped and still fearful Following Commands: Follows one step commands consistently Problem Solving: Mod A Cognition - Other Comments: slow processing, self limited by anxiety about falling    Extremity/Trunk Assessment Right Lower Extremity Assessment RLE ROM/Strength/Tone: Deficits RLE ROM/Strength/Tone Deficits: P/ROM WFL; grossly 2/5 Left Lower Extremity Assessment LLE ROM/Strength/Tone: Deficits LLE ROM/Strength/Tone Deficits: P/ROM WFL; grossly 2/5 Trunk Assessment Trunk Assessment: Kyphotic   Balance Balance Balance Assessed: Yes Static Sitting Balance Static Sitting - Balance Support: Bilateral upper extremity supported;Feet supported Static Sitting - Level of Assistance: 5: Stand by assistance;4: Min assist Static Sitting - Comment/# of Minutes: varying levels of support needed.  Pt sat EOB for 1 minute without any PT/tech support  with his UE support x2.  Pt fatigues quickly and asks to lie down as soon as he is up.  Encouraged at least 2 minutes of sitting without support which pt completed.  Continues with posterior tilt and forward head.   Dynamic Sitting Balance Dynamic Sitting - Level of Assistance: Not tested (comment)  End of Session PT - End of Session Activity Tolerance: Patient limited by fatigue Patient left: in bed;with call bell/phone within reach Nurse Communication: Mobility status;Need for lift equipment       [name],Tierany Appleby 04/29/2012, 3:54 PM [facility] [phone] (pager)

## 2012-04-29 NOTE — Progress Notes (Signed)
Subjective: Pt awake/alert, wife in room; has some mild abd tenderness at G tube site as expected  Objective: Vital signs in last 24 hours: Temp:  [98.4 F (36.9 C)-99.9 F (37.7 C)] 98.4 F (36.9 C) (04/06 0742) Pulse Rate:  [117-137] 121 (04/06 0742) Resp:  [12-24] 15 (04/06 0742) BP: (90-121)/(55-80) 95/64 mmHg (04/06 0742) SpO2:  [96 %-100 %] 98 % (04/06 0742) Weight:  [137 lb 5.6 oz (62.3 kg)] 137 lb 5.6 oz (62.3 kg) (04/06 0742) Last BM Date: 04/28/12  Intake/Output from previous day: 04/05 0701 - 04/06 0700 In: 2327 [I.V.:2127; IV Piggyback:200] Out: 475 [Urine:350; Stool:125] Intake/Output this shift: Total I/O In: 100 [I.V.:100] Out: -   Gastrostomy tube intact, insertion site ok, mildly tender, abd soft,ND  Lab Results:   Recent Labs  04/28/12 0602 04/29/12 0500  WBC 16.9* 21.8*  HGB 7.8* 8.6*  HCT 24.0* 26.6*  PLT 351 373   BMET  Recent Labs  04/27/12 1137 04/28/12 0602  NA 144 144  K 3.3* 3.7  CL 115* 115*  CO2 19 21  GLUCOSE 128* 96  BUN 20 19  CREATININE 1.16 1.06  CALCIUM 7.9* 8.1*   PT/INR No results found for this basename: LABPROT, INR,  in the last 72 hours ABG No results found for this basename: PHART, PCO2, PO2, HCO3,  in the last 72 hours  Studies/Results: Ir Gastrostomy Tube Mod Sed  04/28/2012  *RADIOLOGY REPORT*  Clinical Data:Malnutrition, post colectomy, in need of enteral feeding support.  PERC PLACEMENT GASTROSTOMY  Technique: The procedure, risks, benefits, and alternatives were explained to the the patient and family.  Questions regarding the procedure were encouraged and answered.  The family and patient understands and consents to the procedure.  As antibiotic prophylaxis, vancomycin 1 gram was ordered pre- procedure and administered intravenously within one hour of incision. Paracentesis was performed to remove the loculated ascites in the upper abdomen, reported separately.  A 5 French angiographic catheter was placed  as orogastric tube. The upper abdomen was prepped with Betadine, draped in usual sterile fashion, and infiltrated locally with 1% lidocaine. Stomach was insufflated using air through the orogastric tube. An 9 French sheath needle was advanced percutaneously into the gastric lumen under fluoroscopy. Gas could be aspirated and a small contrast injection confirmed intraluminal spread. The sheath was exchanged over a guidewire for a 9 [place] vascular sheath, through which the snare device was advanced and used to snare a guidewire passed through the orogastric tube. This was withdrawn, and the snare attached to the 20 French pull-through gastrostomy tube, which was advanced antegrade, positioned with the internal bumper securing the anterior gastric wall to the anterior abdominal wall. Small contrast injection confirms appropriate positioning. The external bumper was applied and the catheter was flushed. No immediate complication.  IMPRESSION: 1. Technically successful 20 French pull-through gastrostomy placement under fluoroscopy.   Original Report Authenticated By: D. Wallace Going, MD    Ir Thoracentesis Asp Pleural Space W/img Guide  04/28/2012  *RADIOLOGY REPORT*  Clinical data: Malnutrition, needs enteral feeding support. Planned gastrostomy placement.  Abdominal ascites.  ULTRASOUND GUIDED PARACENTESIS  Technique: Survey ultrasound of the abdomen was performed and an appropriate skin entry site in the right upper abdomen was selected. Skin site was marked, prepped with Betadine, and draped in usual sterile fashion, and infiltrated locally with 1% lidocaine. A 5 French multisidehole Yueh sheath needle was advanced into the peritoneal space until fluid could be aspirated. The sheath was advanced and the needle removed.  750 ml of clear yellowascites were aspirated. No immediate complication.  IMPRESSION Technically successful ultrasound guided paracentesis, removing 750 ml  of ascites   Original Report  Authenticated By: D. Wallace Going, MD    Results for orders placed during the hospital encounter of 04/17/12  CULTURE, BLOOD (ROUTINE X 2)     Status: None   Collection Time    04/21/12  8:40 AM      Result Value Range Status   Specimen Description BLOOD LEFT ARM   Final   Special Requests BOTTLES DRAWN AEROBIC AND ANAEROBIC 10CC   Final   Culture  Setup Time 04/21/2012 18:02   Final   Culture NO GROWTH 5 DAYS   Final   Report Status 04/27/2012 FINAL   Final  CULTURE, BLOOD (ROUTINE X 2)     Status: None   Collection Time    04/21/12  8:50 AM      Result Value Range Status   Specimen Description BLOOD LEFT ARM   Final   Special Requests BOTTLES DRAWN AEROBIC ONLY 10CC   Final   Culture  Setup Time 04/21/2012 18:02   Final   Culture NO GROWTH 5 DAYS   Final   Report Status 04/27/2012 FINAL   Final  BODY FLUID CULTURE     Status: None   Collection Time    04/25/12 12:25 PM      Result Value Range Status   Specimen Description FLUID PERITONEAL   Final   Special Requests NONE   Final   Gram Stain     Final   Value: NO WBC SEEN     NO ORGANISMS SEEN   Culture NO GROWTH 3 DAYS   Final   Report Status 04/28/2012 FINAL   Final  CULTURE, FUNGUS WITHOUT SMEAR     Status: None   Collection Time    04/25/12 12:26 PM      Result Value Range Status   Specimen Description FLUID PERITONEAL   Final   Special Requests 60ML FLUID   Final   Culture CULTURE IN PROGRESS FOR FOUR WEEKS   Final   Report Status PENDING   Incomplete     Anti-infectives: Anti-infectives   Start     Dose/Rate Route Frequency Ordered Stop   04/28/12 0800  vancomycin (VANCOCIN) IVPB 1000 mg/200 mL premix     1,000 mg 200 mL/hr over 60 Minutes Intravenous On call 04/27/12 1723 04/28/12 0919   04/25/12 2200  aztreonam (AZACTAM) 1 g in dextrose 5 % 50 mL IVPB  Status:  Discontinued     1 g 100 mL/hr over 30 Minutes Intravenous 3 times per day 04/25/12 1625 04/25/12 1636      Assessment/Plan: S/p perc  gastrostomy tube/paracentesis 4/5; ok to use tube for feeds ; monitor labs, check final cx's  LOS: 4 days    ALLRED,D [facility] 04/29/2012

## 2012-04-29 NOTE — Progress Notes (Signed)
Subjective: Gastrostomy successfully placed yesterday Paracentesis 750 ml aspirated Patient feels pretty comfortable today  Objective: Vital signs in last 24 hours: Temp:  [98.4 F (36.9 C)-99.9 F (37.7 C)] 98.4 F (36.9 C) (04/06 0742) Pulse Rate:  [117-137] 121 (04/06 0742) Resp:  [12-24] 15 (04/06 0742) BP: (90-121)/(55-80) 95/64 mmHg (04/06 0742) SpO2:  [96 %-100 %] 98 % (04/06 0742) Weight:  [137 lb 5.6 oz (62.3 kg)] 137 lb 5.6 oz (62.3 kg) (04/06 0742) Last BM Date: 04/28/12  Intake/Output from previous day: 04/05 0701 - 04/06 0700 In: 2327 [I.V.:2127; IV Piggyback:200] Out: 475 [Urine:350; Stool:125] Intake/Output this shift: Total I/O In: 100 [I.V.:100] Out: -   Wound VAC - last changed 4/3 Abd - soft, non-tender Greenish drainage thru g-tube Lab Results:   Recent Labs  04/28/12 0602 04/29/12 0500  WBC 16.9* 21.8*  HGB 7.8* 8.6*  HCT 24.0* 26.6*  PLT 351 373   BMET  Recent Labs  04/27/12 1137 04/28/12 0602  NA 144 144  K 3.3* 3.7  CL 115* 115*  CO2 19 21  GLUCOSE 128* 96  BUN 20 19  CREATININE 1.16 1.06  CALCIUM 7.9* 8.1*   PT/INR No results found for this basename: LABPROT, INR,  in the last 72 hours ABG No results found for this basename: PHART, PCO2, PO2, HCO3,  in the last 72 hours  Studies/Results: Ir Gastrostomy Tube Mod Sed  04/28/2012  *RADIOLOGY REPORT*  Clinical Data:Malnutrition, post colectomy, in need of enteral feeding support.  PERC PLACEMENT GASTROSTOMY  Technique: The procedure, risks, benefits, and alternatives were explained to the the patient and family.  Questions regarding the procedure were encouraged and answered.  The family and patient understands and consents to the procedure.  As antibiotic prophylaxis, vancomycin 1 gram was ordered pre- procedure and administered intravenously within one hour of incision. Paracentesis was performed to remove the loculated ascites in the upper abdomen, reported separately.  A 5  French angiographic catheter was placed as orogastric tube. The upper abdomen was prepped with Betadine, draped in usual sterile fashion, and infiltrated locally with 1% lidocaine. Stomach was insufflated using air through the orogastric tube. An 54 French sheath needle was advanced percutaneously into the gastric lumen under fluoroscopy. Gas could be aspirated and a small contrast injection confirmed intraluminal spread. The sheath was exchanged over a guidewire for a 9 [place] vascular sheath, through which the snare device was advanced and used to snare a guidewire passed through the orogastric tube. This was withdrawn, and the snare attached to the 20 French pull-through gastrostomy tube, which was advanced antegrade, positioned with the internal bumper securing the anterior gastric wall to the anterior abdominal wall. Small contrast injection confirms appropriate positioning. The external bumper was applied and the catheter was flushed. No immediate complication.  IMPRESSION: 1. Technically successful 20 French pull-through gastrostomy placement under fluoroscopy.   Original Report Authenticated By: D. Wallace Going, MD    Ir Thoracentesis Asp Pleural Space W/img Guide  04/28/2012  *RADIOLOGY REPORT*  Clinical data: Malnutrition, needs enteral feeding support. Planned gastrostomy placement.  Abdominal ascites.  ULTRASOUND GUIDED PARACENTESIS  Technique: Survey ultrasound of the abdomen was performed and an appropriate skin entry site in the right upper abdomen was selected. Skin site was marked, prepped with Betadine, and draped in usual sterile fashion, and infiltrated locally with 1% lidocaine. A 5 French multisidehole Yueh sheath needle was advanced into the peritoneal space until fluid could be aspirated. The sheath was advanced and the needle removed.  750 ml of clear yellowascites were aspirated. No immediate complication.  IMPRESSION Technically successful ultrasound guided paracentesis, removing 750 ml   of ascites   Original Report Authenticated By: D. Wallace Going, MD     Anti-infectives: Anti-infectives   Start     Dose/Rate Route Frequency Ordered Stop   04/28/12 0800  vancomycin (VANCOCIN) IVPB 1000 mg/200 mL premix     1,000 mg 200 mL/hr over 60 Minutes Intravenous On call 04/27/12 1723 04/28/12 0919   04/25/12 2200  aztreonam (AZACTAM) 1 g in dextrose 5 % 50 mL IVPB  Status:  Discontinued     1 g 100 mL/hr over 30 Minutes Intravenous 3 times per day 04/25/12 1625 04/25/12 1636      Assessment/Plan: s/p * No surgery found * g-tube management per IR Change VAC today.   LOS: 4 days    Keyana Guevara K. 04/29/2012

## 2012-04-29 NOTE — Progress Notes (Signed)
[facility] Progress Note [name]   Connor Small WUJ:[id number] DOB: [birth year] DOA: 04/25/2012 PCP: No primary provider on file.  Brief narrative: 52 year old male patient with past medical history of ulcerative colitis and prior diverticular perforation. He was admitted to [facility] on 02/26/2012 with C. difficile colitis. He initially underwent exploratory laparotomy for a cecostomy tube placement by Dr. Dian Situ on 02/27/2012 because of colonic stricture. 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 Because of septic shock with acute renal failure and Atrial fib with RVR he was transferred to [facility] 03/01/2012. He required acute dialysis/CVVHD and was followed by pulmonary critical care medicine for his sepsis and shock that required pressors. CCS was consulted in regards to the surgical problems. He developed wound evisceration and underwent multiple procedures with eventual closure and placement of retention sutures on 04/01/2012 by Dr. Johna Sheriff. 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  There was also question of heparin induced thrombocytopenia. He was transitioned to Argatroban and developed a GI bleed on 04/02/2012. He had persistent lower extremity edema but dopplers were negative for DVT. He has a combination of anemia of critical illness as well as acute blood loss anemia and has received multiple packed red blood cells this admission with most recent hemoglobin of >7.0. Because of diffuse weakness, especially right upper extremity weakness, neurology was consulted. 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 Dr. Pearlean Brownie recommended aspirin initially with  transition to full anticoagulation once thrombocytopenia and transaminitis resolved. Because of patient's difficulty swallowing and tolerating enteral feeds initiation of oral medications including anticoagulation has been limited. His feeding tube was initially discontinued on 04/16/2012 and his diet was advanced to dysphagia 3. 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 PT and OT recommended patient proceed with inpatient rehabilitation and he was admitted to that unit on 04/17/2012.  After admission to the rehabilitation unit 04/21/2012 the patient developed nausea & vomiting and chest x-ray revealed a right lower lobe pneumonia likely related to aspiration. He also had associated leukocytosis. Critical care medicine was reconsulted and they recommended beginning vancomycin + Azactam for possible healthcare associated pneumonia.  Unfortunately despite attempts to allow for oral intake he has had significant anorexia and poor intake. Surgery has continued to follow patient and because of anorexia and vomiting there was a suspicion for an ileus. CT scan of the abdomen revealed a loculated left upper quadrant fluid collection. Subsequently Dr. Michaell Cowing with the surgical team debrided the abdominal wound and removed the bolsters and retention sutures on 04/24/2012. A nasogastric tube was replaced on 04/24/2012 as well without any improvement in his GI symptoms despite being to intermittent suction and draining bile. Unfortunately his white count continued to rise (now 15,600) despite having recently been on antibiotics. Because of multiple medical problems that would interfere with successful pursuit of rehabilitative therapy the patient was transferred to the acute care hospital 04/25/2012. Team 1 assumed care of the patient on 04/26/2012.  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, GFR 60-89 ml/min - resolved -Scr stable with great UOP - GFR now normal     Anemia of chronic disease & of critical illness -hgb stable although baseline around 9.5 -hemodynamically stable and no hx CAD so no indications to pursue transfusion at this time -cont iron  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 -improved with aggressive PT/OT in CIR -cont therapies while in SDU - wife assisting with exercises -return to CIR when medically stable  S/p total colectomy for toxic megacolon, C diff -per CCS - no evidence of intra-abdominal abscess/infection -has wound VAC  CVA, 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 -eval by Neuro initially -RUE weakness likely due to shoulder injury (right shoulder subluxation) -Start per tube ASA per previous Neuro recs once PEG placed -once tolerates enteral feeds/PO intake will need to consider full oral anticoag if pt is observed to return to afib   Hypokalemia -follow lytes  PAF (paroxysmal atrial fibrillation) -resolved  Protein-calorie malnutrition, severe / Dehydration -high risk for recurrent DH due to ileostomy- cont IVF -cont imodium to decrease ileo output -Malnutrition major factor contributing to slow recovery/wound healing incluiding recurrent fluid collections/he is post paracentesis 4/2 -now s/p PEG tube per IR   Spleen hematoma - subcapsular & without rupture / Intra-abdominal fluid collection/LUQ -CCS: nothing to do for spleen/self limiting  Recent hyperglycemia -cont SSI -check HgbA1c- no prior h/o diabetes  Persistent sinus tachy Likely multifactorial - keep hydrated - increase  BB - check TSH - consider PE in differential   DVT prophylaxis: SCDs Code Status: Full Family Communication: Patient and wife at bedside Disposition Plan: [facility] F[facility] - possible transfer to tele bed Monday Isolation: None  Consultants: CCS  Procedures: 2/03- Flex sig 2/03 - Decompressive Cecostomy due to colonic stricture and megacolon 2/04 - Total abdominal colectomy and end ileostomy for toxic megacolon 2/09- Thoracentesis 2/26-Cystoscpy w/ BRP 3/03-EL/Liver bx/appliation wound VAC 3/06-EL/open abd wound VAC change 3/09-removal VAC dressing and abdominal closure 4/5 - PEG tube per IR   Antibiotics: PCN, Imipenem ALLERGIC - rash S/p full treatment x 3 weeks for cdiff  Aztreonam 3/29>>> 3/31 vanc 3/29>>> 3/31   HPI/Subjective: Patient is awake.  Affect is quite flat.  He denies specific complaints but feels "very tired in general."  He continues to have difficulty with sleeping during the day and remaining awake at night.  Objective: Blood pressure 115/68, pulse 122, temperature 97.7 F (36.5 C), temperature source Oral, resp. rate 17, height 5\' 9"  (1.753 m), weight 62.3 kg (137 lb 5.6 oz), SpO2 98.00%.  Intake/Output Summary (Last 24 hours) at 04/29/12 1455 Last data filed at 04/29/12 1300  Gross per 24 hour  Intake   2560 ml  Output    550 ml  Net   2010 ml   Exam: General: No acute respiratory distress Lungs: Clear to auscultation bilaterally without wheezes or crackles Cardiovascular: Tachycardic but regular without gallop rub or appreciable murmur at Abdomen: Nondistended, soft, hypoactive bowel sounds, no rebound, no ascites, no appreciable mass; ileostomy stoma stable without evidence of bleeding; wound VAC over midline abdominal incision, PEG insertion site clean and dry Musculoskeletal: No significant cyanosis, clubbing of bilateral lower extremities - trace B LE edema w/o focal/unilateral edema  Data Reviewed: Basic Metabolic Panel:  Recent Labs Lab  04/25/12 0005 04/26/12 0600 04/27/12 0500 04/27/12 1137 04/28/12 0602  NA 136 140 143 144  144  K 3.6 3.3* 3.1* 3.3* 3.7  CL 107 112 114* 115* 115*  CO2 20 16* 19 19 21   GLUCOSE 77 74 117* 128* 96  BUN 25* 22 19 20 19   CREATININE 1.54* 1.34 1.21 1.16 1.06  CALCIUM 8.1* 7.8* 8.0* 7.9* 8.1*  MG 1.6  --   --   --   --   PHOS 3.7  --   --   --  2.2*   Liver Function Tests:  Recent Labs Lab 04/24/12 1441 04/25/12 0005 04/26/12 0600 04/27/12 1137 04/28/12 0602  AST 30 37 27 22  --   ALT 27 28 23 19   --   ALKPHOS 291* 294* 296* 295*  --   BILITOT 1.3* 1.2 1.0 1.0  --   PROT 5.2* 5.0* 4.8* 4.9*  --   ALBUMIN 1.4* 1.4* 1.2* 1.3* 1.2*   CBC:  Recent Labs Lab 04/25/12 0005 04/26/12 0600 04/27/12 0500 04/28/12 0602 04/29/12 0500  WBC 16.7* 15.6* 16.3* 16.9* 21.8*  NEUTROABS 13.8*  --   --   --   --   HGB 8.0* 7.8* 7.7* 7.8* 8.6*  HCT 23.9* 23.5* 24.0* 24.0* 26.6*  MCV 91.9 91.8 93.0 93.4 94.0  PLT 403* 364 364 351 373   BNP (last 3 results)  Recent Labs  04/10/12 0650  PROBNP 239.6*   CBG:  Recent Labs Lab 04/27/12 2202 04/28/12 0743 04/28/12 2117 04/29/12 0756 04/29/12 1233  GLUCAP 101* 90 73 79 77    Recent Results (from the past 240 hour(s))  CULTURE, BLOOD (ROUTINE X 2)     Status: None   Collection Time    04/21/12  8:40 AM      Result Value Range Status   Specimen Description BLOOD LEFT ARM   Final   Special Requests BOTTLES DRAWN AEROBIC AND ANAEROBIC 10CC   Final   Culture  Setup Time 04/21/2012 18:02   Final   Culture NO GROWTH 5 DAYS   Final   Report Status 04/27/2012 FINAL   Final  CULTURE, BLOOD (ROUTINE X 2)     Status: None   Collection Time    04/21/12  8:50 AM      Result Value Range Status   Specimen Description BLOOD LEFT ARM   Final   Special Requests BOTTLES DRAWN AEROBIC ONLY 10CC   Final   Culture  Setup Time 04/21/2012 18:02   Final   Culture NO GROWTH 5 DAYS   Final   Report Status 04/27/2012 FINAL   Final  BODY FLUID  CULTURE     Status: None   Collection Time    04/25/12 12:25 PM      Result Value Range Status   Specimen Description FLUID PERITONEAL   Final   Special Requests NONE   Final   Gram Stain     Final   Value: NO WBC SEEN     NO ORGANISMS SEEN   Culture NO GROWTH 3 DAYS   Final   Report Status 04/28/2012 FINAL   Final  CULTURE, FUNGUS WITHOUT SMEAR     Status: None   Collection Time    04/25/12 12:26 PM      Result Value Range Status   Specimen Description FLUID PERITONEAL   Final   Special Requests FLUID   Final   Culture CULTURE IN PROGRESS FOR FOUR WEEKS   Final   Report Status PENDING   Incomplete     Studies:  Recent x-ray studies have  been reviewed in detail by the Attending Physician  Scheduled Meds:  Reviewed in detail by the Attending Physician  Lonia Blood, MD [facility] Office  [phone] Pager [phone]  On-Call/Text Page:      [email or web address]      password [email or web address]  If 7PM-7AM, please contact night-coverage [email or web address] Password [email or web address] 04/29/2012, 2:55 PM   LOS: 4 days

## 2012-04-30 LAB — GLUCOSE, CAPILLARY
Glucose-Capillary: 92 mg/dL (ref 70–99)
Glucose-Capillary: 105 mg/dL — ABNORMAL HIGH (ref 70–99)
Glucose-Capillary: 122 mg/dL — ABNORMAL HIGH (ref 70–99)

## 2012-04-30 LAB — COMPREHENSIVE METABOLIC PANEL
ALT: 12 U/L (ref 0–53)
Albumin: 1.2 g/dL — ABNORMAL LOW (ref 3.5–5.2)
Alkaline Phosphatase: 219 U/L — ABNORMAL HIGH (ref 39–117)
Potassium: 3.6 mEq/L (ref 3.5–5.1)
Sodium: 147 mEq/L — ABNORMAL HIGH (ref 135–145)
Total Protein: 4.9 g/dL — ABNORMAL LOW (ref 6.0–8.3)

## 2012-04-30 LAB — CBC
Platelets: 345 10*3/uL (ref 150–400)
RDW: 17.1 % — ABNORMAL HIGH (ref 11.5–15.5)
WBC: 21.9 10*3/uL — ABNORMAL HIGH (ref 4.0–10.5)

## 2012-04-30 MED ORDER — SODIUM CHLORIDE 0.9 % IV BOLUS (SEPSIS)
250.0000 mL | Freq: Once | INTRAVENOUS | Status: AC
  Administered 2012-04-30: 250 mL via INTRAVENOUS

## 2012-04-30 MED ORDER — VITAL AF 1.2 CAL PO LIQD
1000.0000 mL | ORAL | Status: DC
  Administered 2012-05-01 – 2012-05-07 (×7): 1000 mL
  Filled 2012-04-30 (×16): qty 1000

## 2012-04-30 MED ORDER — FREE WATER
200.0000 mL | Status: DC
  Administered 2012-04-30 – 2012-05-09 (×72): 200 mL

## 2012-04-30 MED ORDER — VITAL AF 1.2 CAL PO LIQD
1000.0000 mL | ORAL | Status: DC
  Filled 2012-04-30: qty 1000

## 2012-04-30 NOTE — Progress Notes (Signed)
NUTRITION FOLLOW UP  Intervention:   1. MD: Monitor magnesium, potassium, and phosphorus daily for at least 3 days and throughout advancement of nutrition support, MD to replete as needed, as pt is at risk for refeeding syndrome given ongoing severe malnutrition.  2. Once medically appropriate, recommend advancement of Vital AF 1.2 by 10 ml/hr, every 12 hours (pt requires slow advancement 2/2 high refeeding risk), or as tolerated to goal of 75 ml/hr. Goal regimen will provide 2160 kcal, 135 grams protein, 1459 ml free water.  3.  MVI, liquid per tube 4. RD to continue to follow nutrition care plan and for pt's progress with SLP.  Nutrition Dx:   Inadequate oral intake, ongoing.   Goal:  Intake to meet >90% of estimated nutrition needs. Unmet,.  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  Pt with acute respiratory and renal failure with ARDS and septic shock. Pt recently discharged to CIR, however currently admitted as inpatient due to possible aspiration PNA (4/2).  Hospital course nutrition summary:  2/2 - 2/20: permissively underfed with EN  2/21-2/26: advanced to full feeds  2/26 - 3/3: feeds held for surgery  3/4 - 3/11: resumed full feeds  3/11 - 3/12: feeds held for GI bleed  3/13 - 3/24: received full feeds  3/24: TFs discontinued diet initiated; pt with very poor oral intake; tx to rehab 3/25  3/28 - 4/1: increased n/v - pt refusing almost all meals and supplements; wife declined calorie count, requesting TPN  4/2: NGT placed to suction, surgery ordering TF initiation; tx back to acute with plans for trickle feedings. 4/3-4/4:  Trickle feeds of Vital 1.2 @ 30 mL/hr 4/5: PEG placement 4/6  - current: Vital 1.2 a 30 ml/hr ongoing  Pt continues to meet criteria for severe malnutrition of acute illness given 29% wt loss in >1 month and PO/nutrition support meeting </=75% of estimated needs for >1 month.  Sodium  Date/Time Value Range Status  04/30/2012  5:24 AM 147* 135 - 145 mEq/L Final  04/28/2012  6:02 AM 144  135 - 145 mEq/L Final  04/27/2012 11:37 AM 144  135 - 145 mEq/L Final    Potassium  Date/Time Value Range Status  04/30/2012  5:24 AM 3.6  3.5 - 5.1 mEq/L Final  04/28/2012  6:02 AM 3.7  3.5 - 5.1 mEq/L Final  04/27/2012 11:37 AM 3.3* 3.5 - 5.1 mEq/L Final    Phosphorus  Date/Time Value Range Status  04/28/2012  6:02 AM 2.2* 2.3 - 4.6 mg/dL Final  04/25/2012 12:05 AM 3.7  2.3 - 4.6 mg/dL Final  04/13/2012  4:10 AM 3.3  2.3 - 4.6 mg/dL Final    Magnesium  Date/Time Value Range Status  04/25/2012 12:05 AM 1.6  1.5 - 2.5 mg/dL Final  04/13/2012  4:10 AM 2.3  1.5 - 2.5 mg/dL Final  04/12/2012  4:35 AM 1.8  1.5 - 2.5 mg/dL Final   Pt is currently receiving Vital AF 1.2 at 30 ml/hr now via PEG. This provides 864 kcal, 54 grams protein, 584 ml free water. Pt has not been advanced beyond this initial rate since 4/3. RD received consult this morning by MD to increase tube feeds as tolerated.  Per Dr. Thereasa Solo, pt with orders to  advance to 40 ml/hr at 1800 and then continue advancement at that time. Wife noted that SLP is to come today to perform therapy with patient at this time; noted pt without diet order presently. Discussed need for maximum enteral nutrition even with diet order to help promote positive nutrition status, wife in agreement. Wife with several questions regarding timing and scheduling of medications in reference to nausea - this RD deferred her questions to RN and MD.   Height: Ht Readings from Last 1 Encounters:  04/25/12 5' 9"  (1.753 m)    Weight Status:   Wt Readings from Last 1 Encounters:  04/29/12 137 lb 5.6 oz (62.3 kg)  Wt continuing to decline with  inadequate nutrition.  Re-estimated needs:  Kcal: 2200-2400 Protein: 120-140g Fluid: >2.2 L/day  Skin: incisions, ileostomy, otherwise intact  Diet Order:   no diet ordered at this time   Intake/Output Summary (Last 24 hours) at 04/30/12 1006 Last data filed at 04/30/12 0900  Gross per 24 hour  Intake   1700 ml  Output    725 ml  Net    975 ml    Last BM: 4/7   Labs:   Recent Labs Lab 04/25/12 0005  04/27/12 1137 04/28/12 0602 04/30/12 0524  NA 136  < > 144 144 147*  K 3.6  < > 3.3* 3.7 3.6  CL 107  < > 115* 115* 117*  CO2 20  < > 19 21 19   BUN 25*  < > 20 19 21   CREATININE 1.54*  < > 1.16 1.06 0.96  CALCIUM 8.1*  < > 7.9* 8.1* 8.1*  MG 1.6  --   --   --   --   PHOS 3.7  --   --  2.2*  --   GLUCOSE 77  < > 128* 96 121*  < > = values in this interval not displayed.  CBG (last 3)   Recent Labs  04/29/12 1704 04/29/12 2133 04/30/12 0812  GLUCAP 90 92 105*    Scheduled Meds: . aspirin  81 mg Per Tube Daily  . chlorhexidine  15 mL Mouth Rinse BID  . darbepoetin (ARANESP) injection - NON-DIALYSIS  40 mcg Subcutaneous Q Sat-1800  . feeding supplement (VITAL AF 1.2 CAL)  1,000 mL Per Tube Q24H  . ferrous sulfate  300 mg Per Tube TID WC  . free water  200 mL Per Tube Q3H  . insulin aspart  0-5 Units Subcutaneous QHS  . insulin aspart  0-9 Units Subcutaneous TID WC  . lidocaine  1 patch Transdermal Q24H  . lip balm   Topical BID  . loperamide  2 mg Per Tube QHS  . multivitamin  5 mL Per Tube Daily  . pantoprazole sodium  40 mg Per Tube Q1200  . potassium chloride  20 mEq Oral BID  . prochlorperazine  10 mg Intravenous Q6H  . saccharomyces boulardii  250 mg Oral BID  . sodium chloride  250 mL Intravenous Once    Continuous Infusions: none    Inda Coke MS, RD, LDN Pager: [phone] After-hours pager: [phone]

## 2012-04-30 NOTE — Progress Notes (Signed)
Report called to Marlowe Alt RN. Pt to transfer to 6N-31 via bed, no O2. Family and belongings at bedside. Meds in chart, VS stable at time of transfer. No current questions or complaints. CCMD notified. Shahzaib Azevedo L

## 2012-04-30 NOTE — Progress Notes (Signed)
Subjective: Feels ok this am, no n/v, tol 30 cc/hour tube feeds   Objective: Vital signs in last 24 hours: Temp:  [97.7 F (36.5 C)-98.7 F (37.1 C)] 98.2 F (36.8 C) (04/07 0815) Pulse Rate:  [108-141] 108 (04/07 0815) Resp:  [14-18] 15 (04/07 0815) BP: (95-115)/(64-68) 95/64 mmHg (04/07 0815) SpO2:  [95 %-100 %] 95 % (04/07 0815) Last BM Date: 04/29/12  Intake/Output from previous day: 04/06 0701 - 04/07 0700 In: 1590 [I.V.:620; NG/GT:600] Out: 675 [Urine:550; Drains:75; Stool:50] Intake/Output this shift: Total I/O In: 30 [Other:30] Out: -   GI: soft, vac in place changed yesterday, bs present, ileostomy functional, g tube in place   Lab Results:   Recent Labs  04/29/12 0500 04/30/12 0524  WBC 21.8* 21.9*  HGB 8.6* 7.5*  HCT 26.6* 23.1*  PLT 373 345   BMET  Recent Labs  04/28/12 0602 04/30/12 0524  NA 144 147*  K 3.7 3.6  CL 115* 117*  CO2 21 19  GLUCOSE 96 121*  BUN 19 21  CREATININE 1.06 0.96  CALCIUM 8.1* 8.1*   PT/INR No results found for this basename: LABPROT, INR,  in the last 72 hours ABG No results found for this basename: PHART, PCO2, PO2, HCO3,  in the last 72 hours  Studies/Results: Ir Gastrostomy Tube Mod Sed  04/28/2012  *RADIOLOGY REPORT*  Clinical Data:Malnutrition, post colectomy, in need of enteral feeding support.  PERC PLACEMENT GASTROSTOMY  Technique: The procedure, risks, benefits, and alternatives were explained to the the patient and family.  Questions regarding the procedure were encouraged and answered.  The family and patient understands and consents to the procedure.  As antibiotic prophylaxis, vancomycin 1 gram was ordered pre- procedure and administered intravenously within one hour of incision. Paracentesis was performed to remove the loculated ascites in the upper abdomen, reported separately.  A 5 French angiographic catheter was placed as orogastric tube. The upper abdomen was prepped with Betadine, draped in usual  sterile fashion, and infiltrated locally with 1% lidocaine. Stomach was insufflated using air through the orogastric tube. An 51 French sheath needle was advanced percutaneously into the gastric lumen under fluoroscopy. Gas could be aspirated and a small contrast injection confirmed intraluminal spread. The sheath was exchanged over a guidewire for a 9 [place] vascular sheath, through which the snare device was advanced and used to snare a guidewire passed through the orogastric tube. This was withdrawn, and the snare attached to the 20 French pull-through gastrostomy tube, which was advanced antegrade, positioned with the internal bumper securing the anterior gastric wall to the anterior abdominal wall. Small contrast injection confirms appropriate positioning. The external bumper was applied and the catheter was flushed. No immediate complication.  IMPRESSION: 1. Technically successful 20 French pull-through gastrostomy placement under fluoroscopy.   Original Report Authenticated By: D. Wallace Going, MD    Ir Thoracentesis Asp Pleural Space W/img Guide  04/28/2012  *RADIOLOGY REPORT*  Clinical data: Malnutrition, needs enteral feeding support. Planned gastrostomy placement.  Abdominal ascites.  ULTRASOUND GUIDED PARACENTESIS  Technique: Survey ultrasound of the abdomen was performed and an appropriate skin entry site in the right upper abdomen was selected. Skin site was marked, prepped with Betadine, and draped in usual sterile fashion, and infiltrated locally with 1% lidocaine. A 5 French multisidehole Yueh sheath needle was advanced into the peritoneal space until fluid could be aspirated. The sheath was advanced and the needle removed. 750 ml of clear yellowascites were aspirated. No immediate complication.  IMPRESSION Technically successful  ultrasound guided paracentesis, removing 750 ml  of ascites   Original Report Authenticated By: D. Wallace Going, MD     Anti-infectives: Anti-infectives   Start      Dose/Rate Route Frequency Ordered Stop   04/28/12 0800  vancomycin (VANCOCIN) IVPB 1000 mg/200 mL premix     1,000 mg 200 mL/hr over 60 Minutes Intravenous On call 04/27/12 1723 04/28/12 0919   04/25/12 2200  aztreonam (AZACTAM) 1 g in dextrose 5 % 50 mL IVPB  Status:  Discontinued     1 g 100 mL/hr over 30 Minutes Intravenous 3 times per day 04/25/12 1625 04/25/12 1636      Assessment/Plan: S/p g tube  Can increase tube feeds as tolerated  Connor Small 04/30/2012

## 2012-04-30 NOTE — Progress Notes (Signed)
Physical Therapy Treatment Patient Details Name: Connor Small MRN: [id number] DOB: Dec 02, 1960 Today's Date: 04/30/2012 Time: 6213-0865 PT Time Calculation (min): 23 min  PT Assessment / Plan / Recommendation Comments on Treatment Session  pt presents with readmit from CIR 2/2 Aspiration PNA.  pt with recent admit and CIR stay for CVA with R sided weakness, Encephalopathy, Colectomy and Ileosotomy with wound vac, ADRF and R shoulder subluxation 2/2 CVA.  pt generally quite debilitated, but willing towork with therapies.      Follow Up Recommendations  CIR     Does the patient have the potential to tolerate intense rehabilitation     Barriers to Discharge        Equipment Recommendations  None recommended by PT    Recommendations for Other Services Rehab consult  Frequency Min 3X/week   Plan Discharge plan remains appropriate;Frequency remains appropriate    Precautions / Restrictions Precautions Precautions: Fall;Shoulder Type of Shoulder Precautions: R shoulder subluxation Precaution Comments: Abd binder to protect incision, ostomy, tachycardia, Afib, Wound vac, peg tube Required Braces or Orthoses: Other Brace/Splint Other Brace/Splint: R wrist cock up slint Restrictions Weight Bearing Restrictions: No Other Position/Activity Restrictions: support R UE   Pertinent Vitals/Pain Indicates he hurts all over.  Premedicated.      Mobility  Bed Mobility Bed Mobility: Supine to Sit;Sitting - Scoot to [place] of Bed;Sit to Supine;Scooting to [facility] Supine to Sit: 1: +2 Total assist Supine to Sit: Patient Percentage: 30% Sitting - Scoot to Edge of Bed: 1: +2 Total assist Sitting - Scoot to Edge of Bed: Patient Percentage: 0% Sit to Supine: 1: +2 Total assist Sit to Supine: Patient Percentage: 20% Scooting to HOB: 1: +2 Total assist Scooting to [facility]: Patient Percentage: 0% Transfers Transfers: Not assessed Ambulation/Gait Ambulation/Gait Assistance: Not tested (comment) Stairs:  No Wheelchair Mobility Wheelchair Mobility: No    Exercises     PT Diagnosis:    PT Problem List:   PT Treatment Interventions:     PT Goals Acute Rehab PT Goals Time For Goal Achievement: 05/13/12 Potential to Achieve Goals: Good PT Goal: Supine/Side to Sit - Progress: Progressing toward goal PT Goal: Sit at Edge Of Bed - Progress: Progressing toward goal  Visit Information  Last PT Received On: 04/30/12 Assistance Needed: +2 PT/OT Co-Evaluation/Treatment: Yes    Subjective Data  Subjective: Ok.     Cognition  Cognition Overall Cognitive Status: Impaired Area of Impairment: Attention;Problem solving;Memory Difficult to assess due to: Impaired communication Arousal/Alertness: Awake/alert Behavior During Session: Flat affect Current Attention Level: Selective Memory Deficits: difficulty recalling previous sessions and how he has progressed; does recall being dropped and still fearful Following Commands: Follows one step commands consistently Problem Solving: Mod A    Balance  Balance Balance Assessed: Yes Static Sitting Balance Static Sitting - Balance Support: Left upper extremity supported;Feet supported Static Sitting - Level of Assistance: 5: Stand by assistance;4: Min assist Static Sitting - Comment/# of Minutes: pt fluctuates level of A needed to maintain balance.  At best pt is MinG/Close S, but at times leans posteriorly and needs MinA to maintain.   Dynamic Sitting Balance Dynamic Sitting - Balance Support: Left upper extremity supported;Right upper extremity supported;Feet supported Dynamic Sitting - Level of Assistance: 4: Min assist;2: Max assist Dynamic Sitting - Balance Activities: Lateral lean/weight shifting;Forward lean/weight shifting  End of Session PT - End of Session Activity Tolerance: Patient limited by fatigue Patient left: in bed;with call bell/phone within reach Nurse Communication: Mobility status;Need for lift  equipment   GP      Sunny Schlein, [place] [phone] 04/30/2012, 10:46 AM

## 2012-04-30 NOTE — Progress Notes (Signed)
Chaplain visited because pt has requested daily visits for prayer. Pt's wife present and preparing to give pt a shave. Pt glad to be in [facility] now and hopes to get better sleep here. Chaplain had prayer with pt and wife.

## 2012-04-30 NOTE — Progress Notes (Signed)
Speech Language Pathology Dysphagia Treatment Patient Details Name: LORAS GRIESHOP MRN: [id number] DOB: 06/04/1960 Today's Date: 04/30/2012 Time: 1601-0932 SLP Time Calculation (min): 18 min  Assessment / Plan / Recommendation Clinical Impression  Therapy focused on facilitating return to PO following PEG placement. Pt not motivated to consume PO. Thick secretions found on posterior pharyngeal wall, removed with swab. Trials of ice chips and crackers result in frequent throat clearing, less effective cough response. Suspect decompensation since last swallow eval. Suggest continuing ice chips following oral care every hour with staff or family. Otherwise, NPO. SLP to complete MBS tomorrow at 10:00. Continuing PO very important for pts quality of life and maintaining functional swallow ability. Pt is now attempting to expecotrate thick phlegm. Wife reports pt has mucinex on med list. Could this help mobilize secretions?      Diet Recommendation  Continue with Current Diet: NPO (except ice chips after oral care)    SLP Plan Continue with current plan of care   Pertinent Vitals/Pain NA   Swallowing Goals  SLP Swallowing Goals Patient will utilize recommended strategies during swallow to increase swallowing safety with: Minimal cueing Swallow Study Goal #2 - Progress: Progressing toward goal  General Temperature Spikes Noted: No Respiratory Status: Room air Behavior/Cognition: Alert;Cooperative Oral Cavity - Dentition: Adequate natural dentition Patient Positioning: Upright in bed  Oral Cavity - Oral Hygiene Does patient have any of the following "at risk" factors?: Lips - dry, cracked Patient is HIGH RISK - Oral Care Protocol followed (see row info): Yes Patient is AT RISK - Oral Care Protocol followed (see row info): Yes Patient is mechanically ventilated, follow VAP prevention protocol for oral care: Oral care provided every 4 hours   Dysphagia Treatment Treatment focused on: Upgraded  PO texture trials;Patient/family/caregiver Research officer, political party Educated: wife Treatment Methods/Modalities: Skilled observation;Effortful swallow Patient observed directly with PO's: Yes Type of PO's observed: Ice chips;Dysphagia 3 (soft) Feeding: Total assist Liquids provided via: Teaspoon Pharyngeal Phase Signs & Symptoms: Immediate throat clear Type of cueing: Verbal Amount of cueing: Moderate   GO    [name], [place] CCC-SLP [phone]  [name] 04/30/2012, 11:56 AM

## 2012-04-30 NOTE — Evaluation (Signed)
Occupational Therapy Evaluation Patient Details Name: Connor Small MRN: [id number] DOB: Jan 23, 1961 Today's Date: 04/30/2012 Time: 0272-5366 OT Time Calculation (min): 25 min  OT Assessment / Plan / Recommendation Clinical Impression  Pt readmitted to acute hospital from CIR with aspiration PNA.  Has had PEG tube placed.  Pt's hx includes colectomy, sepsis, VDRF, and R cerebellar and L frontoparietal infarts.  Pt has decreased activity tolerance, requires +2 assist for mobility, and is dependent in all aspects of ADL.    OT Assessment  Patient needs continued OT Services    Follow Up Recommendations  CIR    Barriers to Discharge      Equipment Recommendations  3 in 1 bedside comode;Tub/shower bench    Recommendations for Other Services    Frequency  Min 2X/week    Precautions / Restrictions Precautions Precautions: Fall;Shoulder Type of Shoulder Precautions: R shoulder subluxation Precaution Comments: Abd binder to protect incision, ostomy, tachycardia, Afib, Wound vac, peg tube Required Braces or Orthoses: Other Brace/Splint Other Brace/Splint: R wrist cock up slint Restrictions Weight Bearing Restrictions: No Other Position/Activity Restrictions: support R UE   Pertinent Vitals/Pain Hurts all over, repositioned in supine    ADL  Eating/Feeding: +1 Total assistance (ice chips) Where Assessed - Eating/Feeding: Bed level Grooming: Maximal assistance;Wash/dry face Where Assessed - Grooming: Supine, head of bed up Upper Body Bathing: +1 Total assistance Where Assessed - Upper Body Bathing: Supported sitting Lower Body Bathing: +1 Total assistance Where Assessed - Lower Body Bathing: Unsupported sitting;Rolling right and/or left Upper Body Dressing: Maximal assistance Where Assessed - Upper Body Dressing: Supported sitting Lower Body Dressing: +1 Total assistance Where Assessed - Lower Body Dressing: Supported sitting;Rolling right and/or left Transfers/Ambulation Related  to ADLs: Did not transfer pt, became nauseated at EOB.    OT Diagnosis: Generalized weakness;Cognitive deficits;Acute pain;Hemiplegia dominant side;Altered mental status  OT Problem List: Decreased strength;Decreased range of motion;Decreased activity tolerance;Decreased coordination;Decreased cognition;Decreased knowledge of use of DME or AE;Decreased knowledge of precautions;Cardiopulmonary status limiting activity;Impaired tone;Impaired UE functional use;Pain;Increased edema OT Treatment Interventions: Self-care/ADL training;Therapeutic exercise;Neuromuscular education;DME and/or AE instruction;Therapeutic activities;Cognitive remediation/compensation;Patient/family education;Balance training   OT Goals Acute Rehab OT Goals OT Goal Formulation: With patient Time For Goal Achievement: 05/14/12 Potential to Achieve Goals: Good ADL Goals Pt Will Perform Grooming: with mod assist;Sitting, chair ADL Goal: Grooming - Progress: Goal set today Pt Will Perform Upper Body Bathing: with mod assist;Sitting, chair;Supported ADL Goal: Product manager - Progress: Goal set today Pt Will Perform Upper Body Dressing: with mod assist;Sitting, chair ADL Goal: Upper Body Dressing - Progress: Goal set today Additional ADL Goal #1: Pt will complete bed mobility with mod A in preparation for ADL.  ADL Goal: Additional Goal #1 - Progress: Goal set today Additional ADL Goal #2: Pt will sit EOB with min A supported in preparation for ADL ADL Goal: Additional Goal #2 - Progress: Goal set today Arm Goals Additional Arm Goal #1: Wife will complete P/AAROM BUE with S Arm Goal: Additional Goal #1 - Progress: Goal set today Miscellaneous OT Goals Miscellaneous OT Goal #4: Using left hand. feed self ice chips with set up OT Goal: Miscellaneous Goal #4 - Progress: Goal set today  Visit Information  Last OT Received On: 04/30/12 Assistance Needed: +2 PT/OT Co-Evaluation/Treatment: Yes    Subjective Data   Subjective: "Come on! I just woke up!" Patient Stated Goal: Return to rehab.   Prior Functioning     Home Living Lives With: Spouse Available Help at Discharge: Family;Available 24  hours/day Type of Home: House Home Access: Stairs to enter [employer] of Steps: 1 step onto porch and one step into house Entrance Stairs-Rails: None Home Layout: One level Bathroom Shower/Tub: Health visitor: Standard Bathroom Accessibility: Yes How Accessible: Accessible via walker Additional Comments: wife fmla, family close by and will help; his brother Prior Function Level of Independence: Independent Able to Take Stairs?: Yes Driving: Yes Vocation: Full time employment Communication Communication: Other (comment);Expressive difficulties Dominant Hand: Right         Vision/Perception Vision - History Baseline Vision: No visual deficits   Cognition  Cognition Overall Cognitive Status: Impaired Area of Impairment: Attention;Problem solving;Memory Difficult to assess due to: Impaired communication Arousal/Alertness: Awake/alert Behavior During Session: Flat affect Current Attention Level: Selective Memory Deficits: difficulty recalling previous sessions and how he has progressed; does recall being dropped and still fearful Following Commands: Follows one step commands consistently Problem Solving: Mod A    Extremity/Trunk Assessment Right Upper Extremity Assessment RUE ROM/Strength/Tone: Deficits RUE ROM/Strength/Tone Deficits: active shoulder shrug oncommand. min gross grasp with wrist flexion. no active release. no active elbow flex.ext. no increase in tone. subluxed (inferior)shoulder RUE Coordination: Deficits RUE Coordination Deficits: nonfunctional arm/hand Left Upper Extremity Assessment LUE ROM/Strength/Tone: Deficits LUE ROM/Strength/Tone Deficits: shoulder 2+/5, elbow to hand 3/5 LUE Coordination: Deficits LUE Coordination Deficits: decreased  functional use for ADL Trunk Assessment Trunk Assessment: Kyphotic (flexed cervical spine)     Mobility Bed Mobility Bed Mobility: Supine to Sit;Sitting - Scoot to [place] of Bed;Sit to Supine;Scooting to [facility] Supine to Sit: 1: +2 Total assist Supine to Sit: Patient Percentage: 30% Sitting - Scoot to Edge of Bed: 1: +2 Total assist Sitting - Scoot to Edge of Bed: Patient Percentage: 0% Sit to Supine: 1: +2 Total assist Sit to Supine: Patient Percentage: 20% Scooting to HOB: 1: +2 Total assist Scooting to [facility]: Patient Percentage: 0%     Exercise General Exercises - Upper Extremity Shoulder Flexion: AAROM;10 reps;Supine Elbow Flexion: AAROM;Supine;5 reps Elbow Extension: AAROM;5 reps;Supine Wrist Flexion: PROM;5 reps;Supine Wrist Extension: PROM;5 reps;Supine Digit Composite Flexion: AAROM;5 reps;Supine Composite Extension: PROM;5 reps;Supine   Balance Balance Balance Assessed: Yes Static Sitting Balance Static Sitting - Balance Support: Left upper extremity supported;Feet supported Static Sitting - Level of Assistance: 5: Stand by assistance;4: Min assist Static Sitting - Comment/# of Minutes: pt fluctuates level of A needed to maintain balance.  At best pt is MinG/Close S, but at times leans posteriorly and needs MinA to maintain.   Dynamic Sitting Balance Dynamic Sitting - Balance Support: Left upper extremity supported;Right upper extremity supported;Feet supported Dynamic Sitting - Level of Assistance: 4: Min assist;2: Max assist Dynamic Sitting - Balance Activities: Lateral lean/weight shifting;Forward lean/weight shifting   End of Session OT - End of Session Activity Tolerance: Patient limited by fatigue Patient left: in bed;with call bell/phone within reach  GO     Evern Bio 04/30/2012, 11:28 AM [phone]

## 2012-04-30 NOTE — Progress Notes (Signed)
[facility] Progress Note [place] [name]   Connor Small MWN:[id number] DOB: 1960/04/14 DOA: 04/25/2012 PCP: No primary provider on file.  Brief narrative: 52 year old male patient with past medical history of ulcerative colitis and prior diverticular perforation. He was [facility] on 02/26/2012 with C. difficile colitis. He initially underwent exploratory laparotomy for a cecostomy tube placement by Dr. Dian Situ on 02/27/2012 because of colonic stricture. 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 Because of septic shock with acute renal failure and Atrial fib with RVR he was transferred to [facility] 03/01/2012. He required acute dialysis/CVVHD and was followed by pulmonary critical care medicine for his sepsis and shock that required pressors. CCS was consulted in regards to the surgical problems. He developed wound evisceration and underwent multiple procedures with eventual closure and placement of retention sutures on 04/01/2012 by Dr. Johna Sheriff. 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  There was also question of heparin induced thrombocytopenia. He was transitioned to Argatroban and developed a GI bleed on 04/02/2012. He had persistent lower extremity edema but dopplers were negative for DVT. He has a combination of anemia of critical illness as well as acute blood loss anemia and has received multiple packed red blood cells this admission with most recent hemoglobin of >7.0. Because of diffuse weakness, especially right upper extremity weakness, neurology was consulted. 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 Dr. Pearlean Brownie recommended aspirin initially with  transition to full anticoagulation once thrombocytopenia and transaminitis resolved. Because of patient's difficulty swallowing and tolerating enteral feeds initiation of oral medications including anticoagulation has been limited. His feeding tube was initially discontinued on 04/16/2012 and his diet was advanced to dysphagia 3. 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 PT and OT recommended patient proceed with inpatient rehabilitation and he was admitted to that unit on 04/17/2012.  After admission to the rehabilitation unit 04/21/2012 the patient developed nausea & vomiting and chest x-ray revealed a right lower lobe pneumonia likely related to aspiration. He also had associated leukocytosis. Critical care medicine was reconsulted and they recommended beginning vancomycin + Azactam for possible healthcare associated pneumonia.  Unfortunately despite attempts to allow for oral intake he has had significant anorexia and poor intake. Surgery has continued to follow patient and because of anorexia and vomiting there was a suspicion for an ileus. CT scan of the abdomen revealed a loculated left upper quadrant fluid collection. Subsequently Dr. Michaell Cowing with the surgical team debrided the abdominal wound and removed the bolsters and retention sutures on 04/24/2012. A nasogastric tube was replaced on 04/24/2012 as well without any improvement in his GI symptoms despite being to intermittent suction and draining bile. Unfortunately his white count continued to rise (now 15,600) despite having recently been on antibiotics. Because of multiple medical problems that would interfere with successful pursuit of rehabilitative therapy the patient was transferred to the acute care hospital 04/25/2012. Team 1 assumed care of the patient on 04/26/2012.  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 -improved with aggressive PT/OT in CIR -cont therapies while in SDU - wife assisting with exercises -return to CIR when medically stable  S/p total colectomy for toxic megacolon, C diff -per CCS - no evidence of intra-abdominal abscess/infection -has wound VAC  CVA, 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 -eval by Neuro initially -RUE weakness likely due to shoulder injury (right shoulder subluxation) -per tube ASA per previous Neuro recs  -once tolerates enteral feeds/PO intake will need to consider full oral anticoag if pt is observed to return to afib   Hypokalemia -follow lytes  PAF (paroxysmal atrial fibrillation) -resolved -likely more sporadic isolated event as opposed to true paroxysmal since only occurred when septic -did have embolic CVA but unable to definitely clarify if etiology solely septic vs thrombo emobilic so will need to consider chronic anti-coagulation  Protein-calorie malnutrition, severe / Dehydration -high risk for recurrent DH due to ileostomy- cont IVF -cont imodium to decrease ileo output -Malnutrition major factor contributing to slow recovery/wound healing incluiding recurrent fluid collections/he is post paracentesis 4/2 -now s/p PEG tube per IR - advancing toward goal rate 75/hr  Spleen hematoma - subcapsular & without rupture /  Intra-abdominal fluid collection/LUQ -CCS: nothing to do for spleen/self limiting  Recent hyperglycemia -cont SSI -check HgbA1c- no prior h/o diabetes  Persistent sinus tachy Likely multifactorial - keep hydrated/give add'l bolus today (4/7) - check TSH - consider PE in differential, but has had recent negative dopplers and contrast exposure in setting of recent ARF too risky at this time    DVT prophylaxis: SCDs Code Status: Full Family Communication: Patient and wife at bedside Disposition Plan: transfer to tele bed  Isolation: None  Consultants: CCS  Procedures: 2/03- Flex sig 2/03 - Decompressive Cecostomy due to colonic stricture and megacolon 2/04 - Total abdominal colectomy and end ileostomy for toxic megacolon 2/09- Thoracentesis 2/26-Cystoscpy w/ BRP 3/03-EL/Liver bx/appliation wound VAC 3/06-EL/open abd wound VAC change 3/09-removal VAC dressing and abdominal closure 4/5 - PEG tube per IR   Antibiotics: PCN, Imipenem ALLERGIC - rash S/p full treatment x 3 weeks for cdiff  Aztreonam 3/29>>> 3/31 vanc 3/29>>> 3/31   HPI/Subjective: Patient is sleeping. Wife at bedside and updates on issues since rounds yesterday  Objective: Blood pressure 95/64, pulse 108, temperature 98.2 F (36.8 C), temperature source Axillary, resp. rate 15, height 5\' 9"  (1.753 m), weight 62.3 kg (137 lb 5.6 oz), SpO2 95.00%.  Intake/Output Summary (Last 24 hours) at 04/30/12 1217 Last data filed at 04/30/12 0900  Gross per 24 hour  Intake   1260 ml  Output    650 ml  Net    610 ml   Exam: General: No acute respiratory distress Lungs: Clear to auscultation bilaterally without wheezes or crackles Cardiovascular: Tachycardic but regular without gallop rub or appreciable murmur Abdomen: Nondistended, soft, hypoactive bowel sounds, no rebound, no ascites, no appreciable mass; ileostomy stoma stable without evidence of bleeding; wound VAC over midline abdominal incision, PEG insertion  site clean and dry Musculoskeletal: No significant cyanosis, clubbing of bilateral lower extremities -  trace B LE edema w/o focal/unilateral edema  Data Reviewed: Basic Metabolic Panel:  Recent Labs Lab 04/25/12 0005 04/26/12 0600 04/27/12 0500 04/27/12 1137 04/28/12 0602 04/30/12 0524  NA 136 140 143 144 144 147*  K 3.6 3.3* 3.1* 3.3* 3.7 3.6  CL 107 112 114* 115* 115* 117*  CO2 20 16* 19 19 21 19   GLUCOSE 77 74 117* 128* 96 121*  BUN 25* 22 19 20 19 21   CREATININE 1.54* 1.34 1.21 1.16 1.06 0.96  CALCIUM 8.1* 7.8* 8.0* 7.9* 8.1* 8.1*  MG 1.6  --   --   --   --   --   PHOS 3.7  --   --   --  2.2*  --    Liver Function Tests:  Recent Labs Lab 04/24/12 1441 04/25/12 0005 04/26/12 0600 04/27/12 1137 04/28/12 0602 04/30/12 0524  AST 30 37 27 22  --  15  ALT 27 28 23 19   --  12  ALKPHOS 291* 294* 296* 295*  --  219*  BILITOT 1.3* 1.2 1.0 1.0  --  0.9  PROT 5.2* 5.0* 4.8* 4.9*  --  4.9*  ALBUMIN 1.4* 1.4* 1.2* 1.3* 1.2* 1.2*   CBC:  Recent Labs Lab 04/25/12 0005 04/26/12 0600 04/27/12 0500 04/28/12 0602 04/29/12 0500 04/30/12 0524  WBC 16.7* 15.6* 16.3* 16.9* 21.8* 21.9*  NEUTROABS 13.8*  --   --   --   --   --   HGB 8.0* 7.8* 7.7* 7.8* 8.6* 7.5*  HCT 23.9* 23.5* 24.0* 24.0* 26.6* 23.1*  MCV 91.9 91.8 93.0 93.4 94.0 93.1  PLT 403* 364 364 351 373 345   BNP (last 3 results)  Recent Labs  04/10/12 0650  PROBNP 239.6*   CBG:  Recent Labs Lab 04/29/12 0756 04/29/12 1233 04/29/12 1704 04/29/12 2133 04/30/12 0812  GLUCAP 79 77 90 92 105*    Recent Results (from the past 240 hour(s))  CULTURE, BLOOD (ROUTINE X 2)     Status: None   Collection Time    04/21/12  8:40 AM      Result Value Range Status   Specimen Description BLOOD LEFT ARM   Final   Special Requests BOTTLES DRAWN AEROBIC AND ANAEROBIC 10CC   Final   Culture  Setup Time 04/21/2012 18:02   Final   Culture NO GROWTH 5 DAYS   Final   Report Status 04/27/2012 FINAL   Final  CULTURE,  BLOOD (ROUTINE X 2)     Status: None   Collection Time    04/21/12  8:50 AM      Result Value Range Status   Specimen Description BLOOD LEFT ARM   Final   Special Requests BOTTLES DRAWN AEROBIC ONLY 10CC   Final   Culture  Setup Time 04/21/2012 18:02   Final   Culture NO GROWTH 5 DAYS   Final   Report Status 04/27/2012 FINAL   Final  BODY FLUID CULTURE     Status: None   Collection Time    04/25/12 12:25 PM      Result Value Range Status   Specimen Description FLUID PERITONEAL   Final   Special Requests NONE   Final   Gram Stain     Final   Value: NO WBC SEEN     NO ORGANISMS SEEN   Culture NO GROWTH 3 DAYS   Final   Report Status 04/28/2012 FINAL   Final  CULTURE, FUNGUS WITHOUT SMEAR     Status: None  Collection Time    04/25/12 12:26 PM      Result Value Range Status   Specimen Description FLUID PERITONEAL   Final   Special Requests FLUID   Final   Culture CULTURE IN PROGRESS FOR FOUR WEEKS   Final   Report Status PENDING   Incomplete     Studies:  Recent x-ray studies have been reviewed in detail by the Attending Physician  Scheduled Meds:  Reviewed in detail by the Attending Physician  Junious Silk, ANP [facility] Office  [phone] Pager [phone]  On-Call/Text Page:      [email or web address]      password [email or web address]  If 7PM-7AM, please contact night-coverage [email or web address] Password [email or web address] 04/30/2012, 12:17 PM   LOS: 5 days    I have personally examined this patient and reviewed the entire database. I have reviewed the above note, made any necessary editorial changes, and agree with its content.  Lonia Blood, MD [facility]

## 2012-05-01 ENCOUNTER — Inpatient Hospital Stay (HOSPITAL_COMMUNITY): Payer: BC Managed Care – PPO

## 2012-05-01 DIAGNOSIS — I498 Other specified cardiac arrhythmias: Secondary | ICD-10-CM

## 2012-05-01 DIAGNOSIS — D72829 Elevated white blood cell count, unspecified: Secondary | ICD-10-CM

## 2012-05-01 LAB — CBC
MCH: 30.2 pg (ref 26.0–34.0)
Platelets: 345 10*3/uL (ref 150–400)
RBC: 2.32 MIL/uL — ABNORMAL LOW (ref 4.22–5.81)
WBC: 18.5 10*3/uL — ABNORMAL HIGH (ref 4.0–10.5)

## 2012-05-01 LAB — URINALYSIS, ROUTINE W REFLEX MICROSCOPIC
Bilirubin Urine: NEGATIVE
Glucose, UA: NEGATIVE mg/dL
Ketones, ur: NEGATIVE mg/dL
Leukocytes, UA: NEGATIVE
pH: 5 (ref 5.0–8.0)

## 2012-05-01 LAB — URINE MICROSCOPIC-ADD ON

## 2012-05-01 LAB — BASIC METABOLIC PANEL
CO2: 22 mEq/L (ref 19–32)
Calcium: 7.9 mg/dL — ABNORMAL LOW (ref 8.4–10.5)
GFR calc Af Amer: >90 mL/min (ref >90)
Sodium: 145 mEq/L (ref 135–145)

## 2012-05-01 LAB — GLUCOSE, CAPILLARY: Glucose-Capillary: 123 mg/dL — ABNORMAL HIGH (ref 70–99)

## 2012-05-01 MED ORDER — TECHNETIUM TC 99M DIETHYLENETRIAME-PENTAACETIC ACID: 40.0000 | Freq: Once | INTRAVENOUS | Status: AC | PRN

## 2012-05-01 MED ORDER — TECHNETIUM TO 99M ALBUMIN AGGREGATED
6.0000 | Freq: Once | INTRAVENOUS | Status: AC | PRN
  Administered 2012-05-01: 6 via INTRAVENOUS

## 2012-05-01 MED ORDER — LORAZEPAM 0.5 MG PO TABS
0.2500 mg | ORAL_TABLET | Freq: Once | ORAL | Status: AC
  Administered 2012-05-01: 0.25 mg
  Filled 2012-05-01: qty 1

## 2012-05-01 MED ORDER — DARBEPOETIN ALFA-POLYSORBATE 40 MCG/0.4ML IJ SOLN
40.0000 ug | INTRAMUSCULAR | Status: DC
Start: 2012-05-01 — End: 2012-05-09
  Administered 2012-05-01 – 2012-05-08 (×2): 40 ug via SUBCUTANEOUS
  Filled 2012-05-01 (×3): qty 0.4

## 2012-05-01 NOTE — Progress Notes (Addendum)
[facility] PROGRESS NOTE  Connor Small ZYY:[id number] DOB: 29-Sep-1960 DOA: 04/25/2012 PCP: No primary provider on file.  Assessment/Plan: 52 year old male patient with past medical history of ulcerative colitis and prior diverticular perforation. He was admitted to [facility] on 02/26/2012 with C. difficile colitis. He initially underwent exploratory laparotomy for a cecostomy tube placement by Dr. Bernette Mayers on 02/27/2012 because of colonic stricture. 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 Because of septic shock with acute renal failure and Atrial fib with RVR he was transferred to [facility] 03/01/2012. He required acute dialysis/CVVHD and was followed by pulmonary critical care medicine for his sepsis and shock that required pressors. [facility] was consulted in regards to the surgical problems. He developed wound evisceration and underwent multiple procedures with eventual closure and placement of retention sutures on 04/01/2012 by Dr. Excell Seltzer. 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  There was also question of heparin induced thrombocytopenia. He was transitioned to Argatroban and developed a GI bleed on 04/02/2012. He had persistent lower extremity edema but dopplers were negative for DVT. He has a combination of anemia of critical illness as well as acute blood loss anemia and has received multiple packed red blood cells this admission with most recent hemoglobin of >7.0. Because of diffuse weakness, especially right upper extremity weakness, neurology was consulted. 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 Dr. Leonie Man recommended aspirin initially with transition to full anticoagulation once  thrombocytopenia and transaminitis resolved. Because of patient's difficulty swallowing and tolerating enteral feeds initiation of oral medications including anticoagulation has been limited. His feeding tube was initially discontinued on 04/16/2012 and his diet was advanced to dysphagia 3. 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 PT and OT recommended patient proceed with inpatient rehabilitation and he was admitted to that unit on 04/17/2012.  After admission to the rehabilitation unit 04/21/2012 the patient developed nausea & vomiting and chest x-ray revealed a right lower lobe pneumonia likely related to aspiration. He also had associated leukocytosis. Critical care medicine was reconsulted and they recommended beginning vancomycin + Azactam for possible healthcare associated pneumonia. Unfortunately despite attempts to allow for oral intake he has had significant anorexia and poor intake. Surgery has continued to follow patient and because of anorexia and vomiting there was a suspicion for an ileus. CT scan of the abdomen revealed a loculated left upper quadrant fluid collection. Subsequently Dr. Johney Maine with the surgical team debrided the abdominal wound and removed the bolsters and retention sutures on 04/24/2012. A nasogastric tube was replaced on 04/24/2012 as well without any improvement in his GI symptoms despite being to intermittent suction and draining bile. Unfortunately his white count continued to rise (now 15,600) despite having recently been on antibiotics. Because of multiple medical problems that would interfere with successful pursuit of rehabilitative therapy the patient was transferred to the acute care hospital 04/25/2012. Team 1 assumed care of the patient on 04/26/2012. Transfer to regular bed 4-7.   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   2-Acute renal failure on CKD  [facility] PROGRESS NOTE  Connor Small ZYY:[id number] DOB: 29-Sep-1960 DOA: 04/25/2012 PCP: No primary provider on file.  Assessment/Plan: 52 year old male patient with past medical history of ulcerative colitis and prior diverticular perforation. He was admitted to [facility] on 02/26/2012 with C. difficile colitis. He initially underwent exploratory laparotomy for a cecostomy tube placement by Dr. Bernette Mayers on 02/27/2012 because of colonic stricture. 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 Because of septic shock with acute renal failure and Atrial fib with RVR he was transferred to [facility] 03/01/2012. He required acute dialysis/CVVHD and was followed by pulmonary critical care medicine for his sepsis and shock that required pressors. CCS was consulted in regards to the surgical problems. He developed wound evisceration and underwent multiple procedures with eventual closure and placement of retention sutures on 04/01/2012 by Dr. Excell Seltzer. 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  There was also question of heparin induced thrombocytopenia. He was transitioned to Argatroban and developed a GI bleed on 04/02/2012. He had persistent lower extremity edema but dopplers were negative for DVT. He has a combination of anemia of critical illness as well as acute blood loss anemia and has received multiple packed red blood cells this admission with most recent hemoglobin of >7.0. Because of diffuse weakness, especially right upper extremity weakness, neurology was consulted. 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 Dr. Leonie Man recommended aspirin initially with transition to full anticoagulation once  thrombocytopenia and transaminitis resolved. Because of patient's difficulty swallowing and tolerating enteral feeds initiation of oral medications including anticoagulation has been limited. His feeding tube was initially discontinued on 04/16/2012 and his diet was advanced to dysphagia 3. 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 PT and OT recommended patient proceed with inpatient rehabilitation and he was admitted to that unit on 04/17/2012.  After admission to the rehabilitation unit 04/21/2012 the patient developed nausea & vomiting and chest x-ray revealed a right lower lobe pneumonia likely related to aspiration. He also had associated leukocytosis. Critical care medicine was reconsulted and they recommended beginning vancomycin + Azactam for possible healthcare associated pneumonia. Unfortunately despite attempts to allow for oral intake he has had significant anorexia and poor intake. Surgery has continued to follow patient and because of anorexia and vomiting there was a suspicion for an ileus. CT scan of the abdomen revealed a loculated left upper quadrant fluid collection. Subsequently Dr. Johney Maine with the surgical team debrided the abdominal wound and removed the bolsters and retention sutures on 04/24/2012. A nasogastric tube was replaced on 04/24/2012 as well without any improvement in his GI symptoms despite being to intermittent suction and draining bile. Unfortunately his white count continued to rise (now 15,600) despite having recently been on antibiotics. Because of multiple medical problems that would interfere with successful pursuit of rehabilitative therapy the patient was transferred to the acute care hospital 04/25/2012. Team 1 assumed care of the patient on 04/26/2012. Transfer to regular bed 4-7.   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   2-Acute renal failure on CKD  [facility] PROGRESS NOTE  Connor Small ZYY:[id number] DOB: 29-Sep-1960 DOA: 04/25/2012 PCP: No primary provider on file.  Assessment/Plan: 52 year old male patient with past medical history of ulcerative colitis and prior diverticular perforation. He was admitted to [facility] on 02/26/2012 with C. difficile colitis. He initially underwent exploratory laparotomy for a cecostomy tube placement by Dr. Bernette Mayers on 02/27/2012 because of colonic stricture. 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 Because of septic shock with acute renal failure and Atrial fib with RVR he was transferred to [facility] 03/01/2012. He required acute dialysis/CVVHD and was followed by pulmonary critical care medicine for his sepsis and shock that required pressors. CCS was consulted in regards to the surgical problems. He developed wound evisceration and underwent multiple procedures with eventual closure and placement of retention sutures on 04/01/2012 by Dr. Excell Seltzer. 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  There was also question of heparin induced thrombocytopenia. He was transitioned to Argatroban and developed a GI bleed on 04/02/2012. He had persistent lower extremity edema but dopplers were negative for DVT. He has a combination of anemia of critical illness as well as acute blood loss anemia and has received multiple packed red blood cells this admission with most recent hemoglobin of >7.0. Because of diffuse weakness, especially right upper extremity weakness, neurology was consulted. 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 Dr. Leonie Man recommended aspirin initially with transition to full anticoagulation once  thrombocytopenia and transaminitis resolved. Because of patient's difficulty swallowing and tolerating enteral feeds initiation of oral medications including anticoagulation has been limited. His feeding tube was initially discontinued on 04/16/2012 and his diet was advanced to dysphagia 3. 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 PT and OT recommended patient proceed with inpatient rehabilitation and he was admitted to that unit on 04/17/2012.  After admission to the rehabilitation unit 04/21/2012 the patient developed nausea & vomiting and chest x-ray revealed a right lower lobe pneumonia likely related to aspiration. He also had associated leukocytosis. Critical care medicine was reconsulted and they recommended beginning vancomycin + Azactam for possible healthcare associated pneumonia. Unfortunately despite attempts to allow for oral intake he has had significant anorexia and poor intake. Surgery has continued to follow patient and because of anorexia and vomiting there was a suspicion for an ileus. CT scan of the abdomen revealed a loculated left upper quadrant fluid collection. Subsequently Dr. Johney Maine with the surgical team debrided the abdominal wound and removed the bolsters and retention sutures on 04/24/2012. A nasogastric tube was replaced on 04/24/2012 as well without any improvement in his GI symptoms despite being to intermittent suction and draining bile. Unfortunately his white count continued to rise (now 15,600) despite having recently been on antibiotics. Because of multiple medical problems that would interfere with successful pursuit of rehabilitative therapy the patient was transferred to the acute care hospital 04/25/2012. Team 1 assumed care of the patient on 04/26/2012. Transfer to regular bed 4-7.   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   2-Acute renal failure on CKD  [facility] PROGRESS NOTE  Connor Small ZYY:[id number] DOB: 29-Sep-1960 DOA: 04/25/2012 PCP: No primary provider on file.  Assessment/Plan: 52 year old male patient with past medical history of ulcerative colitis and prior diverticular perforation. He was admitted to [facility] on 02/26/2012 with C. difficile colitis. He initially underwent exploratory laparotomy for a cecostomy tube placement by Dr. Bernette Mayers on 02/27/2012 because of colonic stricture. 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 Because of septic shock with acute renal failure and Atrial fib with RVR he was transferred to [facility] 03/01/2012. He required acute dialysis/CVVHD and was followed by pulmonary critical care medicine for his sepsis and shock that required pressors. CCS was consulted in regards to the surgical problems. He developed wound evisceration and underwent multiple procedures with eventual closure and placement of retention sutures on 04/01/2012 by Dr. Excell Seltzer. 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  There was also question of heparin induced thrombocytopenia. He was transitioned to Argatroban and developed a GI bleed on 04/02/2012. He had persistent lower extremity edema but dopplers were negative for DVT. He has a combination of anemia of critical illness as well as acute blood loss anemia and has received multiple packed red blood cells this admission with most recent hemoglobin of >7.0. Because of diffuse weakness, especially right upper extremity weakness, neurology was consulted. 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 Dr. Leonie Man recommended aspirin initially with transition to full anticoagulation once  thrombocytopenia and transaminitis resolved. Because of patient's difficulty swallowing and tolerating enteral feeds initiation of oral medications including anticoagulation has been limited. His feeding tube was initially discontinued on 04/16/2012 and his diet was advanced to dysphagia 3. 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 PT and OT recommended patient proceed with inpatient rehabilitation and he was admitted to that unit on 04/17/2012.  After admission to the rehabilitation unit 04/21/2012 the patient developed nausea & vomiting and chest x-ray revealed a right lower lobe pneumonia likely related to aspiration. He also had associated leukocytosis. Critical care medicine was reconsulted and they recommended beginning vancomycin + Azactam for possible healthcare associated pneumonia. Unfortunately despite attempts to allow for oral intake he has had significant anorexia and poor intake. Surgery has continued to follow patient and because of anorexia and vomiting there was a suspicion for an ileus. CT scan of the abdomen revealed a loculated left upper quadrant fluid collection. Subsequently Dr. Johney Maine with the surgical team debrided the abdominal wound and removed the bolsters and retention sutures on 04/24/2012. A nasogastric tube was replaced on 04/24/2012 as well without any improvement in his GI symptoms despite being to intermittent suction and draining bile. Unfortunately his white count continued to rise (now 15,600) despite having recently been on antibiotics. Because of multiple medical problems that would interfere with successful pursuit of rehabilitative therapy the patient was transferred to the acute care hospital 04/25/2012. Team 1 assumed care of the patient on 04/26/2012. Transfer to regular bed 4-7.   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   2-Acute renal failure on CKD  [facility] PROGRESS NOTE  Connor Small ZYY:[id number] DOB: 29-Sep-1960 DOA: 04/25/2012 PCP: No primary provider on file.  Assessment/Plan: 52 year old male patient with past medical history of ulcerative colitis and prior diverticular perforation. He was admitted to [facility] on 02/26/2012 with C. difficile colitis. He initially underwent exploratory laparotomy for a cecostomy tube placement by Dr. Bernette Mayers on 02/27/2012 because of colonic stricture. 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 Because of septic shock with acute renal failure and Atrial fib with RVR he was transferred to [facility] 03/01/2012. He required acute dialysis/CVVHD and was followed by pulmonary critical care medicine for his sepsis and shock that required pressors. CCS was consulted in regards to the surgical problems. He developed wound evisceration and underwent multiple procedures with eventual closure and placement of retention sutures on 04/01/2012 by Dr. Excell Seltzer. 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  There was also question of heparin induced thrombocytopenia. He was transitioned to Argatroban and developed a GI bleed on 04/02/2012. He had persistent lower extremity edema but dopplers were negative for DVT. He has a combination of anemia of critical illness as well as acute blood loss anemia and has received multiple packed red blood cells this admission with most recent hemoglobin of >7.0. Because of diffuse weakness, especially right upper extremity weakness, neurology was consulted. 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 Dr. Leonie Man recommended aspirin initially with transition to full anticoagulation once  thrombocytopenia and transaminitis resolved. Because of patient's difficulty swallowing and tolerating enteral feeds initiation of oral medications including anticoagulation has been limited. His feeding tube was initially discontinued on 04/16/2012 and his diet was advanced to dysphagia 3. 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 PT and OT recommended patient proceed with inpatient rehabilitation and he was admitted to that unit on 04/17/2012.  After admission to the rehabilitation unit 04/21/2012 the patient developed nausea & vomiting and chest x-ray revealed a right lower lobe pneumonia likely related to aspiration. He also had associated leukocytosis. Critical care medicine was reconsulted and they recommended beginning vancomycin + Azactam for possible healthcare associated pneumonia. Unfortunately despite attempts to allow for oral intake he has had significant anorexia and poor intake. Surgery has continued to follow patient and because of anorexia and vomiting there was a suspicion for an ileus. CT scan of the abdomen revealed a loculated left upper quadrant fluid collection. Subsequently Dr. Johney Maine with the surgical team debrided the abdominal wound and removed the bolsters and retention sutures on 04/24/2012. A nasogastric tube was replaced on 04/24/2012 as well without any improvement in his GI symptoms despite being to intermittent suction and draining bile. Unfortunately his white count continued to rise (now 15,600) despite having recently been on antibiotics. Because of multiple medical problems that would interfere with successful pursuit of rehabilitative therapy the patient was transferred to the acute care hospital 04/25/2012. Team 1 assumed care of the patient on 04/26/2012. Transfer to regular bed 4-7.   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   2-Acute renal failure on CKD  [facility] PROGRESS NOTE  Connor Small ZYY:[id number] DOB: 29-Sep-1960 DOA: 04/25/2012 PCP: No primary provider on file.  Assessment/Plan: 52 year old male patient with past medical history of ulcerative colitis and prior diverticular perforation. He was admitted to [facility] on 02/26/2012 with C. difficile colitis. He initially underwent exploratory laparotomy for a cecostomy tube placement by Dr. Bernette Mayers on 02/27/2012 because of colonic stricture. 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 Because of septic shock with acute renal failure and Atrial fib with RVR he was transferred to [facility] 03/01/2012. He required acute dialysis/CVVHD and was followed by pulmonary critical care medicine for his sepsis and shock that required pressors. CCS was consulted in regards to the surgical problems. He developed wound evisceration and underwent multiple procedures with eventual closure and placement of retention sutures on 04/01/2012 by Dr. Excell Seltzer. 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  There was also question of heparin induced thrombocytopenia. He was transitioned to Argatroban and developed a GI bleed on 04/02/2012. He had persistent lower extremity edema but dopplers were negative for DVT. He has a combination of anemia of critical illness as well as acute blood loss anemia and has received multiple packed red blood cells this admission with most recent hemoglobin of >7.0. Because of diffuse weakness, especially right upper extremity weakness, neurology was consulted. 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 Dr. Leonie Man recommended aspirin initially with transition to full anticoagulation once  thrombocytopenia and transaminitis resolved. Because of patient's difficulty swallowing and tolerating enteral feeds initiation of oral medications including anticoagulation has been limited. His feeding tube was initially discontinued on 04/16/2012 and his diet was advanced to dysphagia 3. 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 PT and OT recommended patient proceed with inpatient rehabilitation and he was admitted to that unit on 04/17/2012.  After admission to the rehabilitation unit 04/21/2012 the patient developed nausea & vomiting and chest x-ray revealed a right lower lobe pneumonia likely related to aspiration. He also had associated leukocytosis. Critical care medicine was reconsulted and they recommended beginning vancomycin + Azactam for possible healthcare associated pneumonia. Unfortunately despite attempts to allow for oral intake he has had significant anorexia and poor intake. Surgery has continued to follow patient and because of anorexia and vomiting there was a suspicion for an ileus. CT scan of the abdomen revealed a loculated left upper quadrant fluid collection. Subsequently Dr. Johney Maine with the surgical team debrided the abdominal wound and removed the bolsters and retention sutures on 04/24/2012. A nasogastric tube was replaced on 04/24/2012 as well without any improvement in his GI symptoms despite being to intermittent suction and draining bile. Unfortunately his white count continued to rise (now 15,600) despite having recently been on antibiotics. Because of multiple medical problems that would interfere with successful pursuit of rehabilitative therapy the patient was transferred to the acute care hospital 04/25/2012. Team 1 assumed care of the patient on 04/26/2012. Transfer to regular bed 4-7.   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   2-Acute renal failure on CKD  Collection Time    04/25/12 12:26 PM      Result Value Range Status   Specimen Description FLUID PERITONEAL   Final   Special Requests 60ML FLUID   Final   Culture CULTURE IN PROGRESS FOR FOUR WEEKS   Final   Report Status PENDING   Incomplete     Studies: Dg Swallowing Func-speech Pathology  05/01/2012  [name], CCC-SLP     05/01/2012 11:37 AM Objective Swallowing Evaluation: Modified Barium Swallowing Study   Patient Details  Name: Connor Small MRN: [id number] Date of Birth: January 07, 1961  Today's Date: 05/01/2012 Time: 1030-1100 SLP Time Calculation (min): 30 min  Past Medical History:  Past Medical History  Diagnosis Date  . Chronic diarrhea   . Rectal bleed   . Hemorrhoids   . Ulcerative colitis     Distal UC over 8 yrs ago diagnosed  . Diverticulitis of large intestine with perforation 10/2011    done at [place]  . 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 history of UC (ulcerative colitis) 12/06/2010   Past Surgical History:  Past Surgical History  Procedure Laterality Date  . Colonoscopy  9/03  . Sigmoidoscopy       06/13/2002  . Temporary ostomy November 06, 2010      for a colon perforation that was done in [facility] (Dr  Payton Doughty).    . Colonoscopy  02/16/2011    Procedure: COLONOSCOPY;  Surgeon: Rogene Houston, MD;   Location[facility];  Service: Endoscopy;  Laterality: N/A;   100  . Flexible sigmoidoscopy  02/27/2012    Procedure: FLEXIBLE SIGMOIDOSCOPY;  Surgeon: Rogene Houston,  MD;  Location: [facility];  Service: Endoscopy;  Laterality:  N/A;  with colonic decompression  . Laparotomy  02/27/2012    Procedure: EXPLORATORY  LAPAROTOMY;  Surgeon: Donato Heinz,  MD;  Location: [facility];  Service: General;  Laterality: N/A;  . Cecostomy  02/27/2012    Procedure: CECOSTOMY;  Surgeon: Donato Heinz, MD;  Location:  [facility];  Service: General;  Laterality: N/A;  Cecostomy Tube  Placement  . Colectomy  02/28/2012    Procedure: TOTAL COLECTOMY;  Surgeon: Donato Heinz, MD;   Location: [facility];  Service: General;  Laterality: N/A;  . Ileostomy  02/28/2012    Procedure: ILEOSTOMY;  Surgeon: Donato Heinz, MD;  Location:  [facility];  Service: General;  Laterality: N/A;  . Cystoscopy w/ ureteral stent placement Bilateral 03/20/2012    Procedure: CYSTOSCOPY WITH RETROGRADE PYELOGRAM/URETERAL STENT  PLACEMENT;  Surgeon: Alexis Frock, MD;  Location: [place];   Service: Urology;  Laterality: Bilateral;  . Laparotomy N/A 03/26/2012    Procedure: EXPLORATORY LAPAROTOMY;  Surgeon: Edward Jolly, MD;  Location: [place];  Service: General;  Laterality:  N/A;  . Application of wound vac N/A 03/26/2012    Procedure: APPLICATION OF WOUND VAC;  Surgeon: Edward Jolly, MD;  Location: [facility];  Service: General;  Laterality:  N/A;  . Liver biopsy N/A 03/26/2012    Procedure: LIVER BIOPSY;  Surgeon: Edward Jolly, MD;   Location: [place];  Service: General;  Laterality: N/A;  . Laparotomy N/A 03/29/2012    Procedure: EXPLORATORY LAPAROTOMY, PARTIAL WOUND CLOSURE;   Surgeon: Edward Jolly, MD;  Location: [place];  Service:  General;  Laterality: N/A;  . Vacuum assisted closure change N/A 03/29/2012    Procedure: Open ABDOMINAL VACUUM  CHANGE;  Surgeon: Marland Kitchen T

## 2012-05-01 NOTE — Progress Notes (Signed)
NUTRITION FOLLOW UP  Intervention:   1. MD: Monitor magnesium, potassium, and phosphorus daily for at least 3 days and throughout advancement of nutrition support, MD to replete as needed, as pt is at risk for refeeding syndrome given ongoing severe malnutrition.  2. Continue with TF advancements as ordered to goal of 75 mL/hr.  Pt PO intake in the past has been negligible and inconsistent.  Please inform RD of any decisions r/t to holding or transitioning to a different regimen. 3.  MVI, liquid per tube until pt able to achieve 50 mL/hr continuous feeds 4. RD to continue to follow nutrition care plan and for pt's progress with SLP.  Nutrition Dx:   Inadequate oral intake, ongoing.   Goal:  Intake to meet >90% of estimated nutrition needs. Unmet,.  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  Pt with acute respiratory and renal failure with ARDS and septic shock. Pt recently discharged to CIR, however currently admitted as inpatient due to possible aspiration PNA (4/2).  Hospital course nutrition summary:  2/2 - 2/20: permissively underfed with EN  2/21-2/26: advanced to full feeds  2/26 - 3/3: feeds held for surgery  3/4 - 3/11: resumed full feeds  3/11 - 3/12: feeds held for GI bleed  3/13 - 3/24: received full feeds  3/24: TFs discontinued diet initiated; pt with very poor oral intake; tx to rehab 3/25  3/28 - 4/1: increased n/v - pt refusing almost all meals and supplements; wife declined calorie count, requesting TPN  4/2: NGT placed to suction, surgery ordering TF initiation; tx back to acute with plans for trickle feedings. 4/3-4/4:  Trickle feeds of Vital 1.2 @ 30 mL/hr 4/5: PEG  placement 4/6 - current: Vital 1.2 advancing to goal rate of 75 ml/hr.  MBS planned for today to assess for appropriateness for PO intake.   Pt continues to meet criteria for severe malnutrition of acute illness given 29% wt loss in >1 month and PO/nutrition support meeting </=75% of estimated needs for >1 month.  Pt is currently receiving Vital AF 1.2 at 40 ml/hr now via PEG. This provides 1152 kcal, 72g protein,  730 ml free water. RD received consult (4/7) to increase tube feeds as tolerated- orders remains active (4/8).  Pt in with MD at time of visit.  Wife states that pt passed swallow evaluation today.    Pt has persistently struggled with nausea since admission which contributed to inability to tolerate TFs. Pt has NOT complained of nausea since starting trickle feeds with slow advancements.  If POs resumed and pt c/o nausea, consider limiting POs to small boluses and continuing TFs at last known tolerated rate (currently 40 mL/hr).    RD strongly recommends ongoing nutrition support for pt with continued PO intake at meals and participation in [place] for swallowing improvement and long-term goal of resuming full PO feeds.  Pt still has not yet reached goal volume of continuous TFs and is not likely capable of consuming adequate nutrition with PO alone at this time due to lethargy, deconditioning, poor appetite, and early satiety.   Height: Ht Readings from Last 1 Encounters:  04/25/12 5' 9"  (1.753 m)    Weight Status:   Wt Readings from Last 1 Encounters:  05/01/12 167 lb 1.7 oz (75.8 kg)  Wt continuing to decline with inadequate nutrition.  Re-estimated needs:  Kcal: 2200-2400 Protein: 120-140g Fluid: >2.2 L/day  Skin: incisions, ileostomy, otherwise intact  Diet Order:   NPO   Intake/Output Summary (Last 24 hours) at 05/01/12 1101 Last data filed at 05/01/12 0700  Gross per 24 hour  Intake    240 ml  Output    670 ml  Net   -430 ml    Last BM: 4/7   Labs:   Recent  Labs Lab 04/25/12 0005  04/28/12 0602 04/30/12 0524 05/01/12 0600  NA 136  < > 144 147* 145  K 3.6  < > 3.7 3.6 4.1  CL 107  < > 115* 117* 115*  CO2 20  < > 21 19 22   BUN 25*  < > 19 21 19   CREATININE 1.54*  < > 1.06 0.96 0.88  CALCIUM 8.1*  < > 8.1* 8.1* 7.9*  MG 1.6  --   --   --   --   PHOS 3.7  --  2.2*  --   --   GLUCOSE 77  < > 96 121* 128*  < > = values in this interval not displayed.  CBG (last 3)   Recent Labs  04/30/12 1705 04/30/12 2237 05/01/12 0754  GLUCAP 122* 98 112*    Scheduled Meds: . aspirin  81 mg Per Tube Daily  . chlorhexidine  15 mL Mouth Rinse BID  . darbepoetin (ARANESP) injection - NON-DIALYSIS  40 mcg Subcutaneous Q Tue-1800  . feeding supplement (VITAL AF 1.2 CAL)  1,000 mL Per Tube Q24H  . ferrous sulfate  300 mg Per Tube TID WC  . free water  200 mL Per Tube Q3H  . insulin aspart  0-5 Units Subcutaneous QHS  . insulin aspart  0-9 Units Subcutaneous TID WC  . lidocaine  1 patch Transdermal Q24H  . lip balm   Topical BID  . loperamide  2 mg Per Tube QHS  . multivitamin  5 mL Per Tube Daily  . pantoprazole sodium  40 mg Per Tube Q1200  . potassium chloride  20 mEq Oral BID  . prochlorperazine  10 mg Intravenous Q6H  . saccharomyces boulardii  250 mg Oral BID    Continuous Infusions: none   Brynda Greathouse, MS RD LDN Clinical Inpatient Dietitian Pager: [phone] Weekend/After hours pager: [phone]

## 2012-05-01 NOTE — Progress Notes (Addendum)
Patient ID: Connor Small, male   DOB: 1960/07/31, 52 y.o.   MRN: [id number]    Subjective: Pt is sleeping.  Wife states he's tolerating his g-tube feeds well.  Objective: Vital signs in last 24 hours: Temp:  [98.2 F (36.8 C)-98.5 F (36.9 C)] 98.4 F (36.9 C) (04/08 0543) Pulse Rate:  [73-120] 113 (04/08 0543) Resp:  [15-21] 18 (04/08 0543) BP: (95-110)/(58-73) 99/59 mmHg (04/08 0543) SpO2:  [95 %-100 %] 99 % (04/08 0543) Weight:  [167 lb 1.7 oz (75.8 kg)] 167 lb 1.7 oz (75.8 kg) (04/08 0543) Last BM Date: 04/30/12  Intake/Output from previous day: 04/07 0701 - 04/08 0700 In: 510 [NG/GT:300] Out: 870 [Urine:300; Drains:20; Stool:550] Intake/Output this shift:    PE: Abd: will examine when VAC changed later today  Lab Results:   Recent Labs  04/30/12 0524 05/01/12 0600  WBC 21.9* 18.5*  HGB 7.5* 7.0*  HCT 23.1* 21.6*  PLT 345 345   BMET  Recent Labs  04/30/12 0524 05/01/12 0600  NA 147* 145  K 3.6 4.1  CL 117* 115*  CO2 19 22  GLUCOSE 121* 128*  BUN 21 19  CREATININE 0.96 0.88  CALCIUM 8.1* 7.9*   PT/INR No results found for this basename: LABPROT, INR,  in the last 72 hours CMP     Component Value Date/Time   NA 145 05/01/2012 0600   K 4.1 05/01/2012 0600   CL 115* 05/01/2012 0600   CO2 22 05/01/2012 0600   GLUCOSE 128* 05/01/2012 0600   BUN 19 05/01/2012 0600   CREATININE 0.88 05/01/2012 0600   CALCIUM 7.9* 05/01/2012 0600   PROT 4.9* 04/30/2012 0524   ALBUMIN 1.2* 04/30/2012 0524   AST 15 04/30/2012 0524   ALT 12 04/30/2012 0524   ALKPHOS 219* 04/30/2012 0524   BILITOT 0.9 04/30/2012 0524   GFRNONAA >90 05/01/2012 0600   GFRAA >90 05/01/2012 0600   Lipase     Component Value Date/Time   LIPASE 34 04/02/2012 0424       Studies/Results: No results found.  Anti-infectives: Anti-infectives   Start     Dose/Rate Route Frequency Ordered Stop   04/28/12 0800  vancomycin (VANCOCIN) IVPB 1000 mg/200 mL premix     1,000 mg 200 mL/hr over 60 Minutes Intravenous On  call 04/27/12 1723 04/28/12 0919   04/25/12 2200  aztreonam (AZACTAM) 1 g in dextrose 5 % 50 mL IVPB  Status:  Discontinued     1 g 100 mL/hr over 30 Minutes Intravenous 3 times per day 04/25/12 1625 04/25/12 1636       Assessment/Plan  1. S/p total abdominal colectomy with ileostomy, s/p multiple takebacks for abdominal closure s/p evisceration 2. Dysphagia, TF  Plan: 1. Will examine wound with VAC change today. 2. Cont TFs while having dysphagia.  Swallow study today.   LOS: 6 days    Daniyla Pfahler E 05/01/2012, 7:56 AM Pager: [phone]  ADDENDUM: Wound is clean with good granulation tissue.  This is fairly shallow.  Will dc VAC and start BID WD dressing changes.  Gianah Batt E

## 2012-05-01 NOTE — Procedures (Signed)
Objective Swallowing Evaluation: Modified Barium Swallowing Study  Patient Details  Name: WAYMAN HEGGEN MRN: [id number] Date of Birth: May 05, 1960  Today's Date: 05/01/2012 Time: 1030-1100 SLP Time Calculation (min): 30 min  Past Medical History:  Past Medical History  Diagnosis Date  . Chronic diarrhea   . Rectal bleed   . Hemorrhoids   . Ulcerative colitis     Distal UC over 8 yrs ago diagnosed  . Diverticulitis of large intestine with perforation 10/2011    done at [facility]  . 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 history of UC (ulcerative colitis) 12/06/2010   Past Surgical History:  Past Surgical History  Procedure Laterality Date  . Colonoscopy  9/03  . Sigmoidoscopy       06/13/2002  . Temporary ostomy November 06, 2010      for a colon perforation that was done in [facility] (Dr Ruben Im).    . Colonoscopy  02/16/2011    Procedure: COLONOSCOPY;  Surgeon: Malissa Hippo, MD;  Location: [facility];  Service: Endoscopy;  Laterality: N/A;  100  . Flexible sigmoidoscopy  02/27/2012    Procedure: FLEXIBLE SIGMOIDOSCOPY;  Surgeon: Malissa Hippo, MD;  Location: [facility];  Service: Endoscopy;  Laterality: N/A;  with colonic decompression  . Laparotomy  02/27/2012    Procedure: EXPLORATORY LAPAROTOMY;  Surgeon: Fabio Bering, MD;  Location: [facility];  Service: General;  Laterality: N/A;  . Cecostomy  02/27/2012    Procedure: CECOSTOMY;  Surgeon: Fabio Bering, MD;  Location: [facility];  Service: General;  Laterality: N/A;  Cecostomy Tube Placement  . Colectomy  02/28/2012    Procedure: TOTAL COLECTOMY;  Surgeon: Fabio Bering, MD;  Location: [facility];  Service: General;  Laterality: N/A;  . Ileostomy  02/28/2012    Procedure: ILEOSTOMY;  Surgeon: Fabio Bering, MD;  Location: [facility];  Service: General;  Laterality: N/A;  . Cystoscopy w/ ureteral stent placement Bilateral 03/20/2012    Procedure: CYSTOSCOPY WITH RETROGRADE PYELOGRAM/URETERAL STENT PLACEMENT;  Surgeon: Sebastian Ache, MD;   Location: [facility] OR;  Service: Urology;  Laterality: Bilateral;  . Laparotomy N/A 03/26/2012    Procedure: EXPLORATORY LAPAROTOMY;  Surgeon: Mariella Saa, MD;  Location: [facility] OR;  Service: General;  Laterality: N/A;  . Application of wound vac N/A 03/26/2012    Procedure: APPLICATION OF WOUND VAC;  Surgeon: Mariella Saa, MD;  Location: [facility] OR;  Service: General;  Laterality: N/A;  . Liver biopsy N/A 03/26/2012    Procedure: LIVER BIOPSY;  Surgeon: Mariella Saa, MD;  Location: [facility] OR;  Service: General;  Laterality: N/A;  . Laparotomy N/A 03/29/2012    Procedure: EXPLORATORY LAPAROTOMY, PARTIAL WOUND CLOSURE;  Surgeon: Mariella Saa, MD;  Location: [facility] OR;  Service: General;  Laterality: N/A;  . Vacuum assisted closure change N/A 03/29/2012    Procedure: Open ABDOMINAL VACUUM  CHANGE;  Surgeon: Mariella Saa, MD;  Location: [facility] OR;  Service: General;  Laterality: N/A;  . Vacuum assisted closure change N/A 04/01/2012    Procedure: removal of abdominal vac dressing and abdominal closure;  Surgeon: Mariella Saa, MD;  Location: [facility] OR;  Service: General;  Laterality: N/A;   HPI:  73 YOM admitted to [facility] for Cdiff on 2/2. Underwent decompressive cecostomy 2/3, initially was better, then worsened on 2/4 with increased abdominal distension and tenderness, decreased renal function.  He was taken back to the OR on 2/4 for total abdominal colectomy and end ileostomy.  Post op he developed ARDS and sepsis shock and was being treated for this.  The morning of 2/6 he had new onset of A-fib and transferred to [facility].  Initial FEES showed severe, dry pharyngeal secretions. after 2-3 days of frequent oral care and ice chips, repeat FEES showed improved pharyngeal hygiene, DI/pudding stated with continue TF. Over the following week pt underent two more surgeries for abdominal wound with brief intubation x2 and then developed GI bleed. Today  Pt has had four FEES studies, with the last recommending   a dys 3 (mechanical soft) diet and pudding thick liquids. Final objective test MBS 04/20/12 on recommended Dys 3 diet and thin liquids via teaspoon. Pt transferred to CIR consuming this diet but develeoped nausea and vomiting, RLL pna, and ascites returned to acute care. PEG placed 4/5, NPO due to overt evidence of aspiration with POs, obvious increase in deconditioning and inability to cough effectively. MBS today to objectively evaluate swallow function.      Assessment / Plan / Recommendation Clinical Impression  Dysphagia Diagnosis: Moderate cervical esophageal phase dysphagia;Mild oral phase dysphagia;Moderate pharyngeal phase dysphagia  Clinical impression: Todays MBS shows similar function to last objective test. Pt remains deconditioned with generalized weakness throughout pharyngeal/laryngeal structures. Appearance of mild cervical esophageal dysphagia impacts complete passage of bolus through UES leading to trace silent penetration/aspiration of thin and thick liquids during the swallow as incomplete/slow opening of UES forces liquids into the vestibule. Today, with heavier/larger boluses via small straw sips, there is better and more timely opening of the UES and decreased penetration. Residuals (oral and oropharyngeal) settle in the valleculae and pyriforms post swallow that also result in trace penetration/aspiraiton.   Recommend pt resume Regular diet, thin liquids with small straw sips, double swallows and occasional throat clear. Pt will need to continue to work on effective cough/throat clear in therapy. May benefit from continued diaphragmatic breathing exercises.     Treatment Recommendation  Therapy as outlined in treatment plan below    Diet Recommendation Regular;Thin liquid   Liquid Administration via: Straw Medication Administration: Via alternative means Supervision: Trained caregiver to feed patient;Full supervision/cueing for compensatory strategies;Staff feed  patient Compensations: Slow rate;Small sips/bites;Effortful swallow;Clear throat intermittently;Multiple dry swallows after each bite/sip Postural Changes and/or Swallow Maneuvers: Seated upright 90 degrees;Upright 30-60 min after meal    Other  Recommendations Oral Care Recommendations: Oral care BID   Follow Up Recommendations  Inpatient Rehab    Frequency and Duration min 2x/week  2 weeks   Pertinent Vitals/Pain NA    SLP Swallow Goals Patient will utilize recommended strategies during swallow to increase swallowing safety with: Minimal cueing Swallow Study Goal #2 - Progress: Progressing toward goal   General HPI: 15 YOM admitted to [facility] for Cdiff on 2/2. Underwent decompressive cecostomy 2/3, initially was better, then worsened on 2/4 with increased abdominal distension and tenderness, decreased renal function.  He was taken back to the OR on 2/4 for total abdominal colectomy and end ileostomy.  Post op he developed ARDS and sepsis shock and was being treated for this.  The morning of 2/6 he had new onset of A-fib and transferred to [facility].  Initial FEES showed severe, dry pharyngeal secretions. after 2-3 days of frequent oral care and ice chips, repeat FEES showed improved pharyngeal hygiene, DI/pudding stated with continue TF. Over the following week pt underent two more surgeries for abdominal wound with brief intubation x2 and then developed GI bleed. Today  Pt has had four  FEES studies, with the last recommending  a dys 3 (mechanical soft) diet and pudding thick liquids. Final objective test MBS 04/20/12 on recommended Dys 3 diet and thin liquids via teaspoon. Pt transferred to CIR consuming this diet but develeoped nausea and vomiting, RLL pna, and ascites returned to acute care. PEG placed 4/5, NPO due to overt evidence of aspiration with POs, obvious increase in deconditioning and ability tto cough. MBS today to objectively evaluate swallow function.  Type of Study: Modified  Barium Swallowing Study Previous Swallow Assessment: FEES 03/20/12, 2/28, 3/12, 3/19; MBS 04/17/12 Diet Prior to this Study: NPO Temperature Spikes Noted: No Respiratory Status: Room air History of Recent Intubation: No Behavior/Cognition: Alert;Cooperative Oral Cavity - Dentition: Adequate natural dentition Self-Feeding Abilities: Total assist Patient Positioning: Upright in bed Baseline Vocal Quality: Breathy;Low vocal intensity Volitional Cough: Weak Volitional Swallow: Able to elicit Anatomy: Other (Comment) (appearance of bony protrusion on cervical spine at UES) Pharyngeal Secretions: Not observed secondary MBS    Reason for Referral     Oral Phase Oral Preparation/Oral Phase Oral Phase: Impaired Oral - Honey Oral - Honey Teaspoon: Not tested Oral - Nectar Oral - Nectar Straw: Within functional limits;Lingual/palatal residue Oral - Thin Oral - Thin Teaspoon: Reduced posterior propulsion;Lingual/palatal residue;Delayed oral transit Oral - Thin Cup: Reduced posterior propulsion;Lingual/palatal residue;Delayed oral transit;Right anterior bolus loss Oral - Thin Straw: Lingual/palatal residue Oral - Solids Oral - Puree: Lingual/palatal residue Oral - Mechanical Soft: Within functional limits Oral - Regular: Within functional limits   Pharyngeal Phase Pharyngeal Phase Pharyngeal Phase: Impaired Pharyngeal - Honey Pharyngeal - Honey Teaspoon: Not tested Pharyngeal - Nectar Pharyngeal - Nectar Teaspoon: Not tested Pharyngeal - Nectar Cup: Not tested Pharyngeal - Nectar Straw: Pharyngeal residue - pyriform sinuses;Pharyngeal residue - valleculae;Penetration/Aspiration during swallow;Reduced tongue base retraction;Reduced airway/laryngeal closure;Reduced laryngeal elevation;Reduced anterior laryngeal mobility;Reduced epiglottic inversion;Reduced pharyngeal peristalsis;Trace aspiration;Penetration/Aspiration after swallow Penetration/Aspiration details (nectar straw): Material enters  airway, CONTACTS cords and not ejected out;Material enters airway, passes BELOW cords without attempt by patient to eject out (silent aspiration) Pharyngeal - Thin Pharyngeal - Ice Chips: Not tested Pharyngeal - Thin Teaspoon: Pharyngeal residue - pyriform sinuses;Pharyngeal residue - valleculae;Penetration/Aspiration during swallow;Reduced tongue base retraction;Reduced airway/laryngeal closure;Reduced laryngeal elevation;Reduced anterior laryngeal mobility;Reduced epiglottic inversion;Reduced pharyngeal peristalsis;Penetration/Aspiration after swallow;Trace aspiration Penetration/Aspiration details (thin teaspoon): Material does not enter airway;Material enters airway, passes BELOW cords without attempt by patient to eject out (silent aspiration);Material enters airway, CONTACTS cords and not ejected out Pharyngeal - Thin Cup: Pharyngeal residue - pyriform sinuses;Pharyngeal residue - valleculae;Penetration/Aspiration during swallow;Reduced tongue base retraction;Reduced airway/laryngeal closure;Reduced laryngeal elevation;Reduced anterior laryngeal mobility;Reduced epiglottic inversion;Reduced pharyngeal peristalsis;Trace aspiration;Penetration/Aspiration after swallow Penetration/Aspiration details (thin cup): Material enters airway, CONTACTS cords and not ejected out;Material enters airway, passes BELOW cords without attempt by patient to eject out (silent aspiration);Material does not enter airway Pharyngeal - Thin Straw: Pharyngeal residue - pyriform sinuses;Pharyngeal residue - valleculae;Penetration/Aspiration during swallow;Reduced tongue base retraction;Reduced airway/laryngeal closure;Reduced laryngeal elevation;Reduced anterior laryngeal mobility;Reduced epiglottic inversion;Reduced pharyngeal peristalsis;Trace aspiration;Penetration/Aspiration after swallow Penetration/Aspiration details (thin straw): Material does not enter airway;Material enters airway, remains ABOVE vocal cords then ejected  out Pharyngeal - Solids Pharyngeal - Puree: Pharyngeal residue - pyriform sinuses;Pharyngeal residue - valleculae;Reduced tongue base retraction;Reduced airway/laryngeal closure;Reduced laryngeal elevation;Reduced anterior laryngeal mobility;Reduced epiglottic inversion;Reduced pharyngeal peristalsis Penetration/Aspiration details (puree): Material does not enter airway Pharyngeal - Mechanical Soft: Pharyngeal residue - pyriform sinuses;Pharyngeal residue - valleculae;Reduced tongue base retraction;Reduced airway/laryngeal closure;Reduced laryngeal elevation;Reduced anterior laryngeal mobility;Reduced epiglottic inversion;Reduced pharyngeal peristalsis Penetration/Aspiration details (mechanical soft): Material  does not enter airway  Cervical Esophageal Phase    GO    Cervical Esophageal Phase Cervical Esophageal Phase: Impaired Cervical Esophageal Phase - Comment Cervical Esophageal Comment: Appearance of mild protrusion of cervical spine at level of UES impedes complete passage to bolus with moderate residuals pooling in pyriform sinuses post swallow        Harlon Ditty, MA CCC-SLP [phone]  Claudine Mouton 05/01/2012, 11:35 AM

## 2012-05-01 NOTE — Progress Notes (Signed)
Agree with above 

## 2012-05-02 DIAGNOSIS — G8250 Quadriplegia, unspecified: Secondary | ICD-10-CM

## 2012-05-02 DIAGNOSIS — I6350 Cerebral infarction due to unspecified occlusion or stenosis of unspecified cerebral artery: Secondary | ICD-10-CM

## 2012-05-02 DIAGNOSIS — I4891 Unspecified atrial fibrillation: Secondary | ICD-10-CM

## 2012-05-02 LAB — CBC
HCT: 22.1 % — ABNORMAL LOW (ref 39.0–52.0)
MCV: 93.2 fL (ref 78.0–100.0)
RDW: 16.9 % — ABNORMAL HIGH (ref 11.5–15.5)
WBC: 18.1 10*3/uL — ABNORMAL HIGH (ref 4.0–10.5)

## 2012-05-02 LAB — MAGNESIUM: Magnesium: 1.5 mg/dL (ref 1.5–2.5)

## 2012-05-02 LAB — GLUCOSE, CAPILLARY
Glucose-Capillary: 100 mg/dL — ABNORMAL HIGH (ref 70–99)
Glucose-Capillary: 101 mg/dL — ABNORMAL HIGH (ref 70–99)

## 2012-05-02 LAB — URINE CULTURE
Colony Count: NO GROWTH
Culture: NO GROWTH

## 2012-05-02 LAB — BASIC METABOLIC PANEL
BUN: 18 mg/dL (ref 6–23)
Chloride: 111 mEq/L (ref 96–112)
Creatinine, Ser: 0.86 mg/dL (ref 0.50–1.35)
GFR calc Af Amer: >90 mL/min (ref >90)

## 2012-05-02 MED ORDER — TEMAZEPAM 7.5 MG PO CAPS
7.5000 mg | ORAL_CAPSULE | Freq: Every evening | ORAL | Status: DC | PRN
  Administered 2012-05-02 – 2012-05-08 (×6): 7.5 mg via ORAL
  Filled 2012-05-02 (×6): qty 1

## 2012-05-02 MED ORDER — ONDANSETRON HCL 4 MG/2ML IJ SOLN
4.0000 mg | Freq: Once | INTRAMUSCULAR | Status: AC
  Administered 2012-05-02: 4 mg via INTRAVENOUS
  Filled 2012-05-02: qty 2

## 2012-05-02 NOTE — Progress Notes (Signed)
I met with patient, his wife, and his daughter at bedside. Discussed that pt is not currently at a level to be able to tolerate intense IP rehab. Our recommendation is for [facility]. Wife states she has researched this as an option and is not agreeable to [facility] or SNF. She feels he needs much more time before determining rehab venue otpions.I will follow pt's progress. Please call me with any questions. [phone]

## 2012-05-02 NOTE — Progress Notes (Signed)
NUTRITION FOLLOW UP  Intervention:   1. Enteral nutrition; No change to TF advancement order, however recommend 10 mL advancement q 6 hrs during the day- suggest 8am and 2pm.  Pt PO intake in the past has been negligible and inconsistent.  Please inform RD of any decisions r/t to holding or transitioning to a different regimen. MD paged to discuss regimen. 2.  Nutrition-related medications; MVI, liquid per tube until pt able to achieve 50 mL/hr continuous feeds 4. RD to continue to follow nutrition care plan and for pt's progress with SLP.  Nutrition Dx:   Inadequate oral intake, ongoing.   Goal:  Intake to meet >90% of estimated nutrition needs. Unmet,.  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  Pt with acute respiratory and renal failure with ARDS and septic shock. Pt recently discharged to CIR, however currently admitted as inpatient due to possible aspiration PNA (4/2).  Hospital course nutrition summary:  2/2 - 2/20: permissively underfed with EN  2/21-2/26: advanced to full feeds  2/26 - 3/3: feeds held for surgery  3/4 - 3/11: resumed full feeds  3/11 - 3/12: feeds held for GI bleed  3/13 - 3/24: received full feeds  3/24: TFs discontinued diet initiated; pt with very poor oral intake; tx to rehab 3/25  3/28 - 4/1: increased n/v - pt refusing almost all meals and supplements; wife declined calorie count, requesting TPN  4/2: NGT placed to suction, surgery ordering TF initiation; tx back to acute with plans for trickle feedings. 4/3-4/4:  Trickle feeds of Vital 1.2 @ 30 mL/hr 4/5: PEG placement, resume of Vital 1.2 4/6-4/9:  Inconsistent TF infusion and frequent adjustment of rate.  Pt  has not yet reached >40 mL/hr with tolerance.  Wt now 151 lbs.  Pt continues to meet criteria for severe malnutrition of acute illness given 29% wt loss in >1 month and PO/nutrition support meeting </=75% of estimated needs for >1 month.  Pt is currently receiving Vital AF 1.2 at 30 ml/hr now via PEG. This provides 864 kcal, 54g protein,  549 ml free water. RD received consult (4/7) to increase tube feeds as tolerated- orders remains active (4/8). Discussed events of last night with wife and RN who reports emesis at 1am.  Pt was given barium for swallow eval and c/o general mild nausea upon return to floor.  Pt was also taken for PE evaluation overnight and per wife was "jostled quite a bit."  After both procedures, TFs were advanced to 50 mL/hr at 8p and experienced emesis at 1a.   Discussed positioning with wife who reports pt sits up >45 degrees during the day and sleeps "around 30 degrees" at night.  If pt able to resume TF advancements, recommend q 6 advancements during the day.    Pt has persistently struggled with nausea since admission which contributed to inability to tolerate TFs. Pt has NOT complained of nausea since starting trickle feeds with slow advancements.  If POs resumed and pt c/o nausea, consider limiting POs to small boluses and continuing TFs at last known tolerated rate (currently 40 mL/hr).    RD strongly recommends ongoing nutrition support for pt with continued PO intake at meals and participation in [place] for swallowing improvement and long-term  goal of resuming full PO feeds.  Pt still has not yet reached goal volume of continuous TFs and is not likely capable of consuming adequate nutrition with PO alone at this time due to lethargy, deconditioning, poor appetite, and early satiety.     Height: Ht Readings from Last 1 Encounters:  04/25/12 5' 9"  (1.753 m)    Weight Status:   Wt Readings from Last 1 Encounters:  05/02/12 151 lb 3.8 oz (68.6 kg)  Wt continuing to decline  with inadequate nutrition.  Re-estimated needs:  Kcal: 2200-2400 Protein: 120-140g Fluid: >2.2 L/day  Skin: incisions, ileostomy, otherwise intact  Diet Order: General   Intake/Output Summary (Last 24 hours) at 05/02/12 0951 Last data filed at 05/02/12 0400  Gross per 24 hour  Intake    200 ml  Output   1325 ml  Net  -1125 ml    Last BM: 4/8   Labs:   Recent Labs Lab 04/28/12 0602 04/30/12 0524 05/01/12 0600 05/01/12 2329 05/02/12 0415  NA 144 147* 145  --  141  K 3.7 3.6 4.1  --  4.3  CL 115* 117* 115*  --  111  CO2 21 19 22   --  22  BUN 19 21 19   --  18  CREATININE 1.06 0.96 0.88  --  0.86  CALCIUM 8.1* 8.1* 7.9*  --  7.9*  MG  --   --   --  1.5  --   PHOS 2.2*  --   --  2.3  --   GLUCOSE 96 121* 128*  --  115*    CBG (last 3)   Recent Labs  05/01/12 1702 05/01/12 2145 05/02/12 0804  GLUCAP 115* 102* 103*    Scheduled Meds: . aspirin  81 mg Per Tube Daily  . chlorhexidine  15 mL Mouth Rinse BID  . darbepoetin (ARANESP) injection - NON-DIALYSIS  40 mcg Subcutaneous Q Tue-1800  . feeding supplement (VITAL AF 1.2 CAL)  1,000 mL Per Tube Q24H  . ferrous sulfate  300 mg Per Tube TID WC  . free water  200 mL Per Tube Q3H  . insulin aspart  0-5 Units Subcutaneous QHS  . insulin aspart  0-9 Units Subcutaneous TID WC  . lidocaine  1 patch Transdermal Q24H  . lip balm   Topical BID  . loperamide  2 mg Per Tube QHS  . multivitamin  5 mL Per Tube Daily  . pantoprazole sodium  40 mg Per Tube Q1200  . potassium chloride  20 mEq Oral BID  . prochlorperazine  10 mg Intravenous Q6H  . saccharomyces boulardii  250 mg Oral BID    Continuous Infusions: none   Brynda Greathouse, MS RD LDN Clinical Inpatient Dietitian Pager: [phone] Weekend/After hours pager: [phone]

## 2012-05-02 NOTE — Progress Notes (Signed)
[facility] PROGRESS NOTE  Connor Small EXH:[id number] DOB: February 19, 1960 DOA: 04/25/2012 PCP: No primary provider on file.   1-Pleural effusions L > R w/ HCAP vs Aspiration pneumonitis  -anbx's dc/d 4/2 since completed 5 days of tx  -suspect severe PCM contributing to fluid accumulations including pleural effusions  -Afebrile, no hypoxemia.   2-Acute renal failure on CKD stage 2, GFR 60-89 ml/min - resolved  -Scr stable with great UOP - GFR now normal  -will monitor  3-Anemia of chronic disease & of critical illness  -hgb stable although baseline around 9.5  -hemodynamically stable.  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   8-PAF (paroxysmal atrial fibrillation)  -resolved.  -likely more sporadic isolated event as opposed to true paroxysmal since only occurred when septic  -did have embolic CVA but unable to definitely clarify if etiology solely septic vs thrombo emobilic so will need to consider chronic anti-coagulation -anticoagulation on hold for now due to low hgb and high risk for bleeding at this moment.   9-Protein-calorie malnutrition, severe / Dehydration -cont imodium to decrease ileo output as needed -Malnutrition major factor contributing to slow recovery/wound healing and 3rd space shifting due to low albumin  -now s/p PEG tube per IR - advancing  toward goal rate 75/hr  -tolerating tube feeding.   10-Spleen hematoma - subcapsular & without rupture / Intra-abdominal fluid collection/LUQ  -CCS: nothing to do for spleen/self limiting  -continue monitoring  11-Recent hyperglycemia  -cont SSI  -due to tube feedings  12-Persistent sinus tachy  Likely multifactorial - keep hydrated.   -TSH WNL -low probability on V/Q scan for PE   13-Leukocytosis: WBC trending down. Most likely due to stress demargination and asp PNA.  -UA and urine cx WNL -neg C. Diff -continue holding for ay further abx's at this moment.   Code Status: Full Family Communication: Care discussed with wife who was at bedside.  Disposition Plan: will ask LTACH to examine patient for admission before further CIR.   Consultants: [facility]CS   Procedures: 2/03- Flex sig  2/03 - Decompressive Cecostomy due to colonic stricture and megacolon  2/04 - Total abdominal colectomy and end ileostomy for toxic megacolon  2/09- Thoracentesis  2/26-Cystoscpy w/ BRP  3/03-EL/Liver bx/appliation wound VAC  3/06-EL/open abd wound VAC change  3/09-removal VAC dressing and abdominal closure  4/5 - PEG tube per IR    Antibiotics: PCN, Imipenem ALLERGIC - rash  S/p full treatment x 3 weeks for cdiff  Aztreonam 3/29>>> 3/31  vanc 3/29>>> 3/31   HPI/Subjective: Patient slightly lethargic, but easily aroused, no fever, no CP, no abdominal pain or any acute complaints.  Objective: Filed Vitals:   05/01/12 1741 05/01/12 2221 05/02/12 0557 05/02/12 1404  BP: 114/74 107/67 101/69 109/70  Pulse: 122 118 114 114  Temp: 98.6 F (37 C) 98.4 F (36.9 C) 98.2 F (36.8 C) 98.1  [facility] PROGRESS NOTE  Connor Small [id number] DOB: February 19, 1960 DOA: 04/25/2012 PCP: No primary provider on file.   1-Pleural effusions L > R w/ HCAP vs Aspiration pneumonitis  -anbx's dc/d 4/2 since completed 5 days of tx  -suspect severe PCM contributing to fluid accumulations including pleural effusions  -Afebrile, no hypoxemia.   2-Acute renal failure on CKD stage 2, GFR 60-89 ml/min - resolved  -Scr stable with great UOP - GFR now normal  -will monitor  3-Anemia of chronic disease & of critical illness  -hgb stable although baseline around 9.5  -hemodynamically stable.  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   8-PAF (paroxysmal atrial fibrillation)  -resolved.  -likely more sporadic isolated event as opposed to true paroxysmal since only occurred when septic  -did have embolic CVA but unable to definitely clarify if etiology solely septic vs thrombo emobilic so will need to consider chronic anti-coagulation -anticoagulation on hold for now due to low hgb and high risk for bleeding at this moment.   9-Protein-calorie malnutrition, severe / Dehydration -cont imodium to decrease ileo output as needed -Malnutrition major factor contributing to slow recovery/wound healing and 3rd space shifting due to low albumin  -now s/p PEG tube per IR - advancing  toward goal rate 75/hr  -tolerating tube feeding.   10-Spleen hematoma - subcapsular & without rupture / Intra-abdominal fluid collection/LUQ  -CCS: nothing to do for spleen/self limiting  -continue monitoring  11-Recent hyperglycemia  -cont SSI  -due to tube feedings  12-Persistent sinus tachy  Likely multifactorial - keep hydrated.   -TSH WNL -low probability on V/Q scan for PE   13-Leukocytosis: WBC trending down. Most likely due to stress demargination and asp PNA.  -UA and urine cx WNL -neg C. Diff -continue holding for ay further abx's at this moment.   Code Status: Full Family Communication: Care discussed with wife who was at bedside.  Disposition Plan: will ask LTACH to examine patient for admission before further CIR.   Consultants: CCS   Procedures: 2/03- Flex sig  2/03 - Decompressive Cecostomy due to colonic stricture and megacolon  2/04 - Total abdominal colectomy and end ileostomy for toxic megacolon  2/09- Thoracentesis  2/26-Cystoscpy w/ BRP  3/03-EL/Liver bx/appliation wound VAC  3/06-EL/open abd wound VAC change  3/09-removal VAC dressing and abdominal closure  4/5 - PEG tube per IR    Antibiotics: PCN, Imipenem ALLERGIC - rash  S/p full treatment x 3 weeks for cdiff  Aztreonam 3/29>>> 3/31  vanc 3/29>>> 3/31   HPI/Subjective: Patient slightly lethargic, but easily aroused, no fever, no CP, no abdominal pain or any acute complaints.  Objective: Filed Vitals:   05/01/12 1741 05/01/12 2221 05/02/12 0557 05/02/12 1404  BP: 114/74 107/67 101/69 109/70  Pulse: 122 118 114 114  Temp: 98.6 F (37 C) 98.4 F (36.9 C) 98.2 F (36.8 C) 98.1  [facility] PROGRESS NOTE  Connor Small [id number] DOB: February 19, 1960 DOA: 04/25/2012 PCP: No primary provider on file.   1-Pleural effusions L > R w/ HCAP vs Aspiration pneumonitis  -anbx's dc/d 4/2 since completed 5 days of tx  -suspect severe PCM contributing to fluid accumulations including pleural effusions  -Afebrile, no hypoxemia.   2-Acute renal failure on CKD stage 2, GFR 60-89 ml/min - resolved  -Scr stable with great UOP - GFR now normal  -will monitor  3-Anemia of chronic disease & of critical illness  -hgb stable although baseline around 9.5  -hemodynamically stable.  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   8-PAF (paroxysmal atrial fibrillation)  -resolved.  -likely more sporadic isolated event as opposed to true paroxysmal since only occurred when septic  -did have embolic CVA but unable to definitely clarify if etiology solely septic vs thrombo emobilic so will need to consider chronic anti-coagulation -anticoagulation on hold for now due to low hgb and high risk for bleeding at this moment.   9-Protein-calorie malnutrition, severe / Dehydration -cont imodium to decrease ileo output as needed -Malnutrition major factor contributing to slow recovery/wound healing and 3rd space shifting due to low albumin  -now s/p PEG tube per IR - advancing  toward goal rate 75/hr  -tolerating tube feeding.   10-Spleen hematoma - subcapsular & without rupture / Intra-abdominal fluid collection/LUQ  -CCS: nothing to do for spleen/self limiting  -continue monitoring  11-Recent hyperglycemia  -cont SSI  -due to tube feedings  12-Persistent sinus tachy  Likely multifactorial - keep hydrated.   -TSH WNL -low probability on V/Q scan for PE   13-Leukocytosis: WBC trending down. Most likely due to stress demargination and asp PNA.  -UA and urine cx WNL -neg C. Diff -continue holding for ay further abx's at this moment.   Code Status: Full Family Communication: Care discussed with wife who was at bedside.  Disposition Plan: will ask LTACH to examine patient for admission before further CIR.   Consultants: [facility]CS   Procedures: 2/03- Flex sig  2/03 - Decompressive Cecostomy due to colonic stricture and megacolon  2/04 - Total abdominal colectomy and end ileostomy for toxic megacolon  2/09- Thoracentesis  2/26-Cystoscpy w/ BRP  3/03-EL/Liver bx/appliation wound VAC  3/06-EL/open abd wound VAC change  3/09-removal VAC dressing and abdominal closure  4/5 - PEG tube per IR    Antibiotics: PCN, Imipenem ALLERGIC - rash  S/p full treatment x 3 weeks for cdiff  Aztreonam 3/29>>> 3/31  vanc 3/29>>> 3/31   HPI/Subjective: Patient slightly lethargic, but easily aroused, no fever, no CP, no abdominal pain or any acute complaints.  Objective: Filed Vitals:   05/01/12 1741 05/01/12 2221 05/02/12 0557 05/02/12 1404  BP: 114/74 107/67 101/69 109/70  Pulse: 122 118 114 114  Temp: 98.6 F (37 C) 98.4 F (36.9 C) 98.2 F (36.8 C) 98.1  [facility] PROGRESS NOTE  Connor Small [id number] DOB: February 19, 1960 DOA: 04/25/2012 PCP: No primary provider on file.   1-Pleural effusions L > R w/ HCAP vs Aspiration pneumonitis  -anbx's dc/d 4/2 since completed 5 days of tx  -suspect severe PCM contributing to fluid accumulations including pleural effusions  -Afebrile, no hypoxemia.   2-Acute renal failure on CKD stage 2, GFR 60-89 ml/min - resolved  -Scr stable with great UOP - GFR now normal  -will monitor  3-Anemia of chronic disease & of critical illness  -hgb stable although baseline around 9.5  -hemodynamically stable.  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   8-PAF (paroxysmal atrial fibrillation)  -resolved.  -likely more sporadic isolated event as opposed to true paroxysmal since only occurred when septic  -did have embolic CVA but unable to definitely clarify if etiology solely septic vs thrombo emobilic so will need to consider chronic anti-coagulation -anticoagulation on hold for now due to low hgb and high risk for bleeding at this moment.   9-Protein-calorie malnutrition, severe / Dehydration -cont imodium to decrease ileo output as needed -Malnutrition major factor contributing to slow recovery/wound healing and 3rd space shifting due to low albumin  -now s/p PEG tube per IR - advancing  toward goal rate 75/hr  -tolerating tube feeding.   10-Spleen hematoma - subcapsular & without rupture / Intra-abdominal fluid collection/LUQ  -CCS: nothing to do for spleen/self limiting  -continue monitoring  11-Recent hyperglycemia  -cont SSI  -due to tube feedings  12-Persistent sinus tachy  Likely multifactorial - keep hydrated.   -TSH WNL -low probability on V/Q scan for PE   13-Leukocytosis: WBC trending down. Most likely due to stress demargination and asp PNA.  -UA and urine cx WNL -neg C. Diff -continue holding for ay further abx's at this moment.   Code Status: Full Family Communication: Care discussed with wife who was at bedside.  Disposition Plan: will ask LTACH to examine patient for admission before further CIR.   Consultants: CCS   Procedures: 2/03- Flex sig  2/03 - Decompressive Cecostomy due to colonic stricture and megacolon  2/04 - Total abdominal colectomy and end ileostomy for toxic megacolon  2/09- Thoracentesis  2/26-Cystoscpy w/ BRP  3/03-EL/Liver bx/appliation wound VAC  3/06-EL/open abd wound VAC change  3/09-removal VAC dressing and abdominal closure  4/5 - PEG tube per IR    Antibiotics: PCN, Imipenem ALLERGIC - rash  S/p full treatment x 3 weeks for cdiff  Aztreonam 3/29>>> 3/31  vanc 3/29>>> 3/31   HPI/Subjective: Patient slightly lethargic, but easily aroused, no fever, no CP, no abdominal pain or any acute complaints.  Objective: Filed Vitals:   05/01/12 1741 05/01/12 2221 05/02/12 0557 05/02/12 1404  BP: 114/74 107/67 101/69 109/70  Pulse: 122 118 114 114  Temp: 98.6 F (37 C) 98.4 F (36.9 C) 98.2 F (36.8 C) 98.1  PERITONEAL   Final   Special Requests 60ML FLUID   Final   Culture CULTURE IN PROGRESS FOR FOUR WEEKS   Final   Report Status PENDING   Incomplete  URINE CULTURE     Status: None   Collection Time    05/01/12  5:30 PM      Result Value Range Status   Specimen Description URINE, CLEAN CATCH   Final   Special Requests NONE   Final   Culture  Setup Time 05/01/2012 18:41   Final   Colony Count NO GROWTH   Final   Culture NO GROWTH   Final   Report Status 05/02/2012 FINAL   Final     Studies: Nm Pulmonary Perf And Vent  05/01/2012  *RADIOLOGY REPORT*  Clinical Data: Tachycardia. Shortness of breath and renal insufficiency.  NM PULMONARY VENTILATION AND PERFUSION SCAN  Radiopharmaceutical: 6MILLI CURIE MAA TECHNETIUM TO 62M ALBUMIN AGGREGATED 40 mCi inhalent 24mtechnetium DTPA  Comparison: Chest x-ray 04/24/2012.  Findings: The diffusion volume of the right lung is diminished, compatible to the left lower lobe airspace disease.  There is mild heterogeneity in both lungs without to a segment significant segmental defect.  There are no significant mismatches.  IMPRESSION: Low probability for pulmonary embolus (10 - 19%) with heterogeneous perfusion but no discrete to segmental defects.   Original Report Authenticated By: CSan Morelle M.D.    Dg Swallowing Func-speech Pathology  05/01/2012  BKatherene PontoDeblois, [place]    05/01/2012 11:37 AM Objective Swallowing Evaluation: Modified Barium Swallowing Study   Patient Details  Name: Connor SCHANKMRN: [id number] of Birth: 322-Nov-1962 Today's Date: 05/01/2012 Time: 1030-1100 SLP Time  Calculation (min): 30 min  Past Medical History:  Past Medical History  Diagnosis Date  . Chronic diarrhea   . Rectal bleed   . Hemorrhoids   . Ulcerative colitis     Distal UC over 8 yrs ago diagnosed  . [place] of large intestine with perforation [place]2011    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 history of UC (ulcerative colitis) 12/06/2010   Past Surgical History:  Past Surgical History  Procedure Laterality Date  . Colonoscopy  9/03  . Sigmoidoscopy       06/13/2002  . Temporary ostomy November 06, 2010      [facility].    . Colonoscopy  02/16/2011    Procedure: COLONOSCOPY;  Surgeon: NRogene Houston MD;   [facility];  Service: Endoscopy;  Laterality: N/A;   100  . Flexible sigmoidoscopy  02/27/2012    Procedure: FLEXIBLE SIGMOIDOSCOPY;  Surgeon: NRogene Houston  MD;  [facility];  Service: Endoscopy;  Laterality:  N/A;  with colonic decompression  . Laparotomy  02/27/2012    Procedure: EXPLORATORY LAPAROTOMY;  Surgeon: BDonato Heinz  MD;  Location: [facility];  Service: General;  Laterality: N/A;  . Cecostomy  02/27/2012    Procedure: CECOSTOMY;  Surgeon: BDonato Heinz MD;  Location:  [facility];  Service: General;  Laterality: N/A;  Cecostomy Tube  Placement  . Colectomy  02/28/2012    Procedure: TOTAL COLECTOMY;  Surgeon: BDonato Heinz MD;   Location: [facility];  Service: General;  Laterality: N/A;  . Ileostomy  02/28/2012    Procedure: ILEOSTOMY;  Surgeon: BDonato Heinz MD;  Location:  [facility];  Service: General;  Laterality: N/A;  .  PERITONEAL   Final   Special Requests 60ML FLUID   Final   Culture CULTURE IN PROGRESS FOR FOUR WEEKS   Final   Report Status PENDING   Incomplete  URINE CULTURE     Status: None   Collection Time    05/01/12  5:30 PM      Result Value Range Status   Specimen Description URINE, CLEAN CATCH   Final   Special Requests NONE   Final   Culture  Setup Time 05/01/2012 18:41   Final   Colony Count NO GROWTH   Final   Culture NO GROWTH   Final   Report Status 05/02/2012 FINAL   Final     Studies: Nm Pulmonary Perf And Vent  05/01/2012  *RADIOLOGY REPORT*  Clinical Data: Tachycardia. Shortness of breath and renal insufficiency.  NM PULMONARY VENTILATION AND PERFUSION SCAN  Radiopharmaceutical: 6MILLI CURIE MAA TECHNETIUM TO 62M ALBUMIN AGGREGATED 40 mCi inhalent 24mtechnetium DTPA  Comparison: Chest x-ray 04/24/2012.  Findings: The diffusion volume of the right lung is diminished, compatible to the left lower lobe airspace disease.  There is mild heterogeneity in both lungs without to a segment significant segmental defect.  There are no significant mismatches.  IMPRESSION: Low probability for pulmonary embolus (10 - 19%) with heterogeneous perfusion but no discrete to segmental defects.   Original Report Authenticated By: CSan Morelle M.D.    Dg Swallowing Func-speech Pathology  05/01/2012  BKatherene [place], [place]    05/01/2012 11:37 AM Objective Swallowing Evaluation: Modified Barium Swallowing Study   Patient Details  Name: Connor SCHANKMRN: [id number] of Birth: 322-Nov-1962 Today's Date: 05/01/2012 Time: 1030-1100 SLP Time  Calculation (min): 30 min  Past Medical History:  Past Medical History  Diagnosis Date  . Chronic diarrhea   . Rectal bleed   . Hemorrhoids   . Ulcerative colitis     Distal UC over 8 yrs ago diagnosed  . Diverticulitis of large intestine with perforation 10[place]2011    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 history of UC (ulcerative colitis) 12/06/2010   Past Surgical History:  Past Surgical History  Procedure Laterality Date  . Colonoscopy  9/03  . Sigmoidoscopy       06/13/2002  . Temporary ostomy November 06, 2010      [facility]  DP[facility]ought[facility].    . Colonoscopy  02/16/2011    Procedure: COLONOSCOPY;  Surgeon: NRogene Houston MD;   [facility];  Service: Endoscopy;  Laterality: N/A;   100  . Flexible sigmoidoscopy  02/27/2012    Procedure: FLEXIBLE SIGMOIDOSCOPY;  Surgeon: NRogene Houston  MD;  [facility];  Service: Endoscopy;  Laterality:  N/A;  with colonic decompression  . Laparotomy  02/27/2012    Procedure: EXPLORATORY LAPAROTOMY;  Surgeon: BDonato Heinz  MD;  Location: [facility];  Service: General;  Laterality: N/A;  . Cecostomy  02/27/2012    Procedure: CECOSTOMY;  Surgeon: BDonato Heinz MD;  Location:  [facility];  Service: General;  Laterality: N/A;  Cecostomy Tube  Placement  . Colectomy  02/28/2012    Procedure: TOTAL COLECTOMY;  Surgeon: BDonato Heinz MD;   Location: [facility];  Service: General;  Laterality: N/A;  . Ileostomy  02/28/2012    Procedure: ILEOSTOMY;  Surgeon: BDonato Heinz MD;  Location:  [facility];  Service: General;  Laterality: N/A;  .  PERITONEAL   Final   Special Requests 60ML FLUID   Final   Culture CULTURE IN PROGRESS FOR FOUR WEEKS   Final   Report Status PENDING   Incomplete  URINE CULTURE     Status: None   Collection Time    05/01/12  5:30 PM      Result Value Range Status   Specimen Description URINE, CLEAN CATCH   Final   Special Requests NONE   Final   Culture  Setup Time 05/01/2012 18:41   Final   Colony Count NO GROWTH   Final   Culture NO GROWTH   Final   Report Status 05/02/2012 FINAL   Final     Studies: Nm Pulmonary Perf And Vent  05/01/2012  *RADIOLOGY REPORT*  Clinical Data: Tachycardia. Shortness of breath and renal insufficiency.  NM PULMONARY VENTILATION AND PERFUSION SCAN  Radiopharmaceutical: 6MILLI CURIE MAA TECHNETIUM TO 62M ALBUMIN AGGREGATED 40 mCi inhalent 24mtechnetium DTPA  Comparison: Chest x-ray 04/24/2012.  Findings: The diffusion volume of the right lung is diminished, compatible to the left lower lobe airspace disease.  There is mild heterogeneity in both lungs without to a segment significant segmental defect.  There are no significant mismatches.  IMPRESSION: Low probability for pulmonary embolus (10 - 19%) with heterogeneous perfusion but no discrete to segmental defects.   Original Report Authenticated By: CSan Morelle M.D.    Dg Swallowing Func-speech Pathology  05/01/2012  BKatherene [place], [place]    05/01/2012 11:37 AM Objective Swallowing Evaluation: Modified Barium Swallowing Study   Patient Details  Name: Connor SCHANKMRN: [id number] of Birth: 322-Nov-1962 Today's Date: 05/01/2012 Time: 1030-1100 SLP Time  Calculation (min): 30 min  Past Medical History:  Past Medical History  Diagnosis Date  . Chronic diarrhea   . Rectal bleed   . Hemorrhoids   . Ulcerative colitis     Distal UC over 8 yrs ago diagnosed  . Diverticulitis of large intestine with perforation 10/2011    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 history of UC (ulcerative colitis) 12/06/2010   Past Surgical History:  Past Surgical History  Procedure Laterality Date  . Colonoscopy  9/03  . Sigmoidoscopy       06/13/2002  . Temporary ostomy November 06, 2010      [facility]  DPayton Dought[facility].    . Colonoscopy  02/16/2011    Procedure: COLONOSCOPY;  Surgeon: NRogene Houston MD;   [facility];  Service: Endoscopy;  Laterality: N/A;   100  . Flexible sigmoidoscopy  02/27/2012    Procedure: FLEXIBLE SIGMOIDOSCOPY;  Surgeon: NRogene Houston  MD;  [facility];  Service: Endoscopy;  Laterality:  N/A;  with colonic decompression  . Laparotomy  02/27/2012    Procedure: EXPLORATORY LAPAROTOMY;  Surgeon: BDonato Heinz  MD;  Location: [facility];  Service: General;  Laterality: N/A;  . Cecostomy  02/27/2012    Procedure: CECOSTOMY;  Surgeon: BDonato Heinz MD;  Location:  [facility];  Service: General;  Laterality: N/A;  Cecostomy Tube  Placement  . Colectomy  02/28/2012    Procedure: TOTAL COLECTOMY;  Surgeon: BDonato Heinz MD;   Location: [facility];  Service: General;  Laterality: N/A;  . Ileostomy  02/28/2012    Procedure: ILEOSTOMY;  Surgeon: BDonato Heinz MD;  Location:  [facility];  Service: General;  Laterality: N/A;  .

## 2012-05-02 NOTE — Consult Note (Signed)
Wound care follow-up: C[facility] team in yesterday to assess abd wound. Vac was D/Ced and this team has provided wound care orders.  WOC will not plan to follow further for abd wound unless re consulted.  Ileostomy pouch intact with good seal.  Supplies at bedside for staff use. Julien Girt MSN, RN, [place], [place], [place]

## 2012-05-02 NOTE — Progress Notes (Signed)
Speech Language Pathology Dysphagia Treatment Patient Details Name: Connor Small MRN: [id number] DOB: Feb 09, 1960 Today's Date: 05/02/2012 Time: 1130-1140 SLP Time Calculation (min): 10 min  Assessment / Plan / Recommendation Clinical Impression  Treatment focused on education only today as patient very lethargic, coming in and out of sleep despite conversation taking place in room with wife and daughter. Reviewed briefly the results of patient's recent MBS and diet recommendations. Despite recommendations for a regular diet, aspiration risk remains and will be increased during periods of lethargy. In light of PEG tube for primary nutrition at this time, educated family on need to encourage po intake only when patient fully alert and able to actively participate in task. Both wife and daughter verbalized understanding. Currently feel that LTACH would be best option for patient at this time with hopeful transition to CIR once tolerance for activity improves. This way, patient can also be monitored by SLP. Wife verbalized understanding of recommendations. Will f/u 4/10.     Diet Recommendation  Continue with Current Diet: Regular;Thin liquid       Pertinent Vitals/Pain None reported    General Temperature Spikes Noted: No Respiratory Status: Room air Behavior/Cognition: Lethargic Oral Cavity - Dentition: Adequate natural dentition Patient Positioning: Upright in bed      Dysphagia Treatment Treatment focused on: Patient/family/caregiver education Family/Caregiver Educated: wife and daughter Patient observed directly with PO's: No Reason PO's not observed: [employer] [place], [place] [phone]   Connor Small 05/02/2012, 12:36 PM

## 2012-05-02 NOTE — Progress Notes (Signed)
Rehab Consult Service:  Pt known to [place] from previous rehab stay. He continues to struggle with extreme weakness and pain. His participation was marginal at best with [place] on inpatient rehab before. He remains total to max assist with all mobility.  I think he is most appropriate for an LTACH situation where he can receive lower intensity therapy while his medical issues continue to be managed. He is NOT appropriate for SNF given his medical acuity. With improvement in pain and exercise tolerance, he could eventually progress to an inpatient rehab level over the next few weeks.  Meredith Staggers, MD, Mellody Drown

## 2012-05-03 DIAGNOSIS — Z5189 Encounter for other specified aftercare: Secondary | ICD-10-CM

## 2012-05-03 DIAGNOSIS — J90 Pleural effusion, not elsewhere classified: Secondary | ICD-10-CM

## 2012-05-03 LAB — CBC
HCT: 22.8 % — ABNORMAL LOW (ref 39.0–52.0)
Hemoglobin: 7.3 g/dL — ABNORMAL LOW (ref 13.0–17.0)
MCHC: 32 g/dL (ref 30.0–36.0)

## 2012-05-03 LAB — GLUCOSE, CAPILLARY
Glucose-Capillary: 107 mg/dL — ABNORMAL HIGH (ref 70–99)
Glucose-Capillary: 113 mg/dL — ABNORMAL HIGH (ref 70–99)
Glucose-Capillary: 112 mg/dL — ABNORMAL HIGH (ref 70–99)

## 2012-05-03 MED ORDER — PROCHLORPERAZINE EDISYLATE 5 MG/ML IJ SOLN
10.0000 mg | Freq: Four times a day (QID) | INTRAMUSCULAR | Status: DC | PRN
  Filled 2012-05-03: qty 2

## 2012-05-03 MED ORDER — METOCLOPRAMIDE HCL 5 MG PO TABS
5.0000 mg | ORAL_TABLET | Freq: Three times a day (TID) | ORAL | Status: DC
  Administered 2012-05-03 – 2012-05-09 (×19): 5 mg via ORAL
  Filled 2012-05-03 (×22): qty 1

## 2012-05-03 NOTE — Progress Notes (Signed)
05/02/12 1557  OT Visit Information  Last OT Received On 05/02/12  Assistance Needed +2  OT Time Calculation  OT Start Time 1557  OT Stop Time 1631  OT Time Calculation (min) 34 min  Precautions  Precautions Fall;Shoulder  Type of Shoulder Precautions R shoulder subluxation  Precaution Comments Abd binder to protect incision, ostomy, tachycardia, Afib, Wound vac, peg tube  Required Braces or Orthoses Other Brace/Splint  Other Brace/Splint R wrist cock up slint  Restrictions  Weight Bearing Restrictions No  Other Position/Activity Restrictions support R UE  ADL  ADL Comments Wife and daughtter report that they have been doing arm exercises and they have the handout  Cognition  Overall Cognitive Status Impaired  Area of Impairment Attention;Problem solving;Memory  Arousal/Alertness Awake/alert  Behavior During Session Flat affect  Current Attention Level Selective  Following Commands Follows one step commands consistently  Problem Solving Mod A  Bed Mobility  Bed Mobility Rolling Left;Left Sidelying to Sit;Sitting - Scoot to Edge of Bed;Sit to Sidelying Left  Rolling Left 1: +1 Total assist  Rolling Left: Patient Percentage 10%  Left Sidelying to Sit 1: +2 Total assist  Left Sidelying to Sit: Patient Percentage 10%  Sitting - Scoot to Edge of Bed 1: +2 Total assist  Sitting - Scoot to Edge of Bed: Patient Percentage 0%  Sit to Sidelying Left 1: +2 Total assist  Sit to Sidelying Left: Patient Percentage 10%  Scooting to HOB 1: +2 Total assist  Scooting to [facility]: Patient Percentage 0%  Balance  Balance Assessed Yes  Static Sitting Balance  Static Sitting - Balance Support Left upper extremity supported;Feet supported  Static Sitting - Level of Assistance 3: Mod assist (At worst)  Static Sitting - Comment/# of Minutes 20 minutes  OT - End of Session  Activity Tolerance Patient limited by fatigue  Patient left in bed;with family/visitor present  OT Assessment/Plan  OT  Frequency Min 2X/week  Follow Up Recommendations CIR  OT Equipment 3 in 1 bedside comode;Tub/shower bench  ADL Goals  ADL Goal: Additional Goal #1 - Progress Not progressing  Additional ADL Goal #2 (PT extremely weak)  ADL Goal: Additional Goal #2 - Progress Progressing toward goals  Arm Goals  Arm Goal: Additional Goal #1 - Progress Met  OT General Charges  $OT Visit 1 Procedure  OT Treatments  $Therapeutic Activity 23-37 mins   Late entry 05/02/2012   Connor Small, OTR/L [phone] 05/03/2012

## 2012-05-03 NOTE — Progress Notes (Signed)
Physical Therapy Treatment Patient Details Name: Connor Small MRN: [id number] DOB: 05-12-1960 Today's Date: 05/03/2012 Time: 1350-1450 (Interrupted x 10 minutes by IV team) PT Time Calculation (min): 60 min  PT Assessment / Plan / Recommendation Comments on Treatment Session  pt presents with readmit from CIR 2/2 Aspiration PNA.  pt with recent admit and CIR stay for CVA with R sided weakness, Encephalopathy, Colectomy and Ileosotomy with wound vac, ADRF and R shoulder subluxation 2/2 CVA.  Pt with very slow progress. Recommend up daily with maximove.     Follow Up Recommendations  SNF (Denied readmit to CIR)     Does the patient have the potential to tolerate intense rehabilitation     Barriers to Discharge        Equipment Recommendations  None recommended by PT    Recommendations for Other Services    Frequency Min 3X/week   Plan Discharge plan needs to be updated;Frequency remains appropriate    Precautions / Restrictions Precautions Precautions: Fall;Shoulder Type of Shoulder Precautions: R shoulder subluxation Precaution Comments: Abd binder to protect incision, ostomy, tachycardia, Afib, Wound vac, peg tube Required Braces or Orthoses: Other Brace/Splint Other Brace/Splint: R wrist cock up slint Restrictions Other Position/Activity Restrictions: support R UE   Pertinent Vitals/Pain Pain in lt knee when flexed to 85 degrees.  Pain in rt knee when flexed past 90 degrees.    Mobility  Bed Mobility Rolling Right: 1: +2 Total assist;With rail Rolling Right: Patient Percentage: 20% Rolling Left: 1: +2 Total assist Rolling Left: Patient Percentage: 10% Supine to Sit: 1: +2 Total assist Supine to Sit: Patient Percentage: 30% Sitting - Scoot to Edge of Bed: 1: +2 Total assist Sitting - Scoot to Edge of Bed: Patient Percentage: 10% Sit to Supine: 1: +2 Total assist Sit to Supine: Patient Percentage: 20% Details for Bed Mobility Assistance: Verbal/tactile cues for  techinique.  Pt c/o pain with knee flexion past 90 degrees to prepare for rolling. Transfers Transfer via Lift Equipment: [place] Details for Transfer Assistance: Transferred bed to chair via [employer].    Exercises     PT Diagnosis:    PT Problem List:   PT Treatment Interventions:     PT Goals Acute Rehab PT Goals PT Goal: Supine/Side to Sit - Progress: Progressing toward goal PT Goal: Sit at Edge Of Bed - Progress: Progressing toward goal PT Transfer Goal: Bed to Chair/Chair to Bed - Progress: Progressing toward goal  Visit Information  Last PT Received On: 05/03/12 Assistance Needed: +2    Subjective Data  Subjective: Pt ready to get OOB.   Cognition  Cognition Overall Cognitive Status: Impaired Arousal/Alertness: Awake/alert Behavior During Session: WFL for tasks performed Following Commands: Follows one step commands consistently Cognition - Other Comments: slow processing    Balance  Static Sitting Balance Static Sitting - Balance Support: Left upper extremity supported;Feet supported Static Sitting - Level of Assistance: 4: Min assist;3: Mod assist Static Sitting - Comment/# of Minutes: Sat EOB x 10 minutes working on extending trunk and neck.  Pt able to initiate more erect posture with verbal/tactile cues.  Could hold posture for 10-15 seconds at a time.  End of Session PT - End of Session Equipment Utilized During Treatment: Other (comment) (Maximove) Activity Tolerance: Patient tolerated treatment well Patient left: in chair Nurse Communication: Mobility status;Need for lift equipment   GP     Carvel Huskins 05/03/2012, 3:54 PM  [facility] PT [phone]

## 2012-05-03 NOTE — Progress Notes (Signed)
Speech Language Pathology Dysphagia Treatment Patient Details Name: Connor Small MRN: [id number] DOB: 01/12/1961 Today's Date: 05/03/2012 Time: 8916-9450 SLP Time Calculation (min): 12 min  Assessment / Plan / Recommendation Clinical Impression  Pt more arousable today; sustained alertness for session.  Dtr present.  Reluctant to take in POs, but willing  to drink some liquids.  Consumed thins with straw with min cues for effortful swallow.  Eating minimally, but safely.  Discussed with pt importance of continuing PO consumption despite presence of PEG. Toleration appeared adequate with no signs of airway compromise.      Diet Recommendation  Continue with Current Diet: Regular;Thin liquid    SLP Plan Continue with current plan of care      Swallowing Goals  SLP Swallowing Goals Patient will utilize recommended strategies during swallow to increase swallowing safety with: Minimal assistance Swallow Study Goal #2 - Progress: Progressing toward goal Goal #4: Pt will consume ice chips with swallow within 7 seconds of oral administration, 2 effortful swallows with max verbal cues.  Swallow Study Goal #4 - Progress: Met  General Temperature Spikes Noted: No Respiratory Status: Room air Behavior/Cognition: Alert Oral Cavity - Dentition: Adequate natural dentition Patient Positioning: Upright in bed  Oral Cavity - Oral Hygiene     Dysphagia Treatment Treatment focused on: Skilled observation of diet tolerance Treatment Methods/Modalities: Skilled observation;Effortful swallow Patient observed directly with PO's: Yes Type of PO's observed: Thin liquids Feeding: Total assist Liquids provided via: Straw Type of cueing: Verbal Amount of cueing: Minimal   Pruitt Taboada L. Tivis Ringer, [place] CCC/SLP Pager [phone]      Juan Quam Laurice 05/03/2012, 10:37 AM

## 2012-05-03 NOTE — Progress Notes (Signed)
NUTRITION FOLLOW UP  Intervention:   1. Enteral nutrition; Undergoing 10 mL advancement q 6 hrs during the day- suggest 8am and 2pm to goal of 75 mL/hr.  Pt PO intake in the past has been negligible and inconsistent.  Please inform RD of any decisions r/t to holding or transitioning to a different regimen. RD to ensure Adult Enteral Protocol has been resumed. 2.  Nutrition-related medications; MVI, liquid per tube until pt able to achieve 50 mL/hr continuous feeds consistently. 4. RD to continue to follow nutrition care plan and for pt's progress with SLP.  Nutrition Dx:   Inadequate oral intake, ongoing.   Goal:  Intake to meet >90% of estimated nutrition needs. Unmet,.  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  Pt with acute respiratory and renal failure with ARDS and septic shock. Pt recently discharged to CIR, however currently admitted as inpatient due to possible aspiration PNA (4/2).  Hospital course nutrition summary:  2/2 - 2/20: permissively underfed with EN  2/21-2/26: advanced to full feeds  2/26 - 3/3: feeds held for surgery  3/4 - 3/11: resumed full feeds  3/11 - 3/12: feeds held for GI bleed  3/13 - 3/24: received full feeds  3/24: TFs discontinued diet initiated; pt with very poor oral intake; tx to rehab 3/25  3/28 - 4/1: increased n/v - pt refusing almost all meals and supplements; wife declined calorie count, requesting TPN  4/2: NGT placed to suction, surgery ordering TF initiation; tx back to acute with plans for trickle feedings. 4/3-4/4:  Trickle feeds of Vital 1.2 @ 30 mL/hr 4/5: PEG placement, resume of Vital 1.2 4/6-4/9:  Inconsistent TF infusion and frequent  adjustment of rate.  Pt has not yet reached >40 mL/hr with tolerance.  Wt now 151 lbs. 4/10:  Pt has advanced to 50 mL/hr with tolerance.  Residual:  50 mL at last check.    Pt continues to meet criteria for severe malnutrition of acute illness given 29% wt loss in >1 month and PO/nutrition support meeting </=75% of estimated needs for >1 month.  Pt is currently receiving Vital AF 1.2 at 50 ml/hr now via PEG. This provides 1440 kcal, 90g protein, and 973 ml free water. RD received consult (4/7) to increase tube feeds as tolerated- orders remains active (4/10).  Pt has persistently struggled with nausea since admission which contributed to inability to tolerate TFs. Pt has NOT complained of nausea since starting trickle feeds with slow advancements.  If POs resumed and pt c/o nausea, consider limiting POs to small boluses and continuing TFs at last known tolerated rate (currently 50 mL/hr).    RD strongly recommends ongoing nutrition support for pt with continued PO intake at meals and participation in [place] for swallowing improvement and long-term goal of resuming full PO feeds.  Pt still has not yet reached goal volume of continuous TFs and is not likely capable of consuming adequate nutrition with PO alone at this time due to lethargy, deconditioning, poor appetite, and early satiety.  Would recommend making adjustments to TF based on PO performance with SLP.  Note pt's hesitancy with PO intake and limited acceptance.   Height: Ht Readings from Last 1 Encounters:  04/25/12 5' 9"  (1.753 m)  Weight Status:   Wt Readings from Last 1 Encounters:  05/02/12 151 lb 3.8 oz (68.6 kg)  Wt continuing to decline with inadequate nutrition.  Re-estimated needs:  Kcal: 2200-2400 Protein: 120-140g Fluid: >2.2 L/day  Skin: incisions, ileostomy, otherwise intact  Diet Order: General   Intake/Output Summary (Last 24 hours) at 05/03/12 1234 Last data filed at 05/03/12 1120  Gross per 24 hour   Intake    240 ml  Output   1725 ml  Net  -1485 ml    Last BM: 4/9   Labs:   Recent Labs Lab 04/28/12 0602 04/30/12 0524 05/01/12 0600 05/01/12 2329 05/02/12 0415  NA 144 147* 145  --  141  K 3.7 3.6 4.1  --  4.3  CL 115* 117* 115*  --  111  CO2 21 19 22   --  22  BUN 19 21 19   --  18  CREATININE 1.06 0.96 0.88  --  0.86  CALCIUM 8.1* 8.1* 7.9*  --  7.9*  MG  --   --   --  1.5  --   PHOS 2.2*  --   --  2.3  --   GLUCOSE 96 121* 128*  --  115*    CBG (last 3)   Recent Labs  05/02/12 2142 05/03/12 0731 05/03/12 1210  GLUCAP 101* 106* 107*    Scheduled Meds: . aspirin  81 mg Per Tube Daily  . chlorhexidine  15 mL Mouth Rinse BID  . darbepoetin (ARANESP) injection - NON-DIALYSIS  40 mcg Subcutaneous Q Tue-1800  . feeding supplement (VITAL AF 1.2 CAL)  1,000 mL Per Tube Q24H  . ferrous sulfate  300 mg Per Tube TID WC  . free water  200 mL Per Tube Q3H  . insulin aspart  0-5 Units Subcutaneous QHS  . insulin aspart  0-9 Units Subcutaneous TID WC  . lidocaine  1 patch Transdermal Q24H  . lip balm   Topical BID  . loperamide  2 mg Per Tube QHS  . multivitamin  5 mL Per Tube Daily  . pantoprazole sodium  40 mg Per Tube Q1200  . potassium chloride  20 mEq Oral BID  . prochlorperazine  10 mg Intravenous Q6H  . saccharomyces boulardii  250 mg Oral BID    Continuous Infusions: none   Brynda Greathouse, MS RD LDN Clinical Inpatient Dietitian Pager: [phone] Weekend/After hours pager: [phone]

## 2012-05-03 NOTE — Progress Notes (Signed)
[facility] PROGRESS NOTE  Connor Small UEA:[id number] DOB: January 21, 1961 DOA: 04/25/2012 PCP: No primary provider on file.   1-Pleural effusions L > R w/ HCAP vs Aspiration pneumonitis  -anbx's dc/d 4/2 since completed 5 days of tx  -suspect severe PCM contributing to fluid accumulations including pleural effusions  -Afebrile, no further hypoxemia.   2-Acute renal failure on CKD stage 2, GFR 60-89 ml/min - resolved  -Scr stable with great UOP - GFR now normal  -will monitor  3-Anemia of chronic disease & of critical illness  -hgb stable although baseline around 9.5  -hemodynamically stable.  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  -continue per tube ASA per previous Neuro recs  -once tolerates enteral feeds/PO intake will need to consider full oral anticoag if pt is observed to return to afib   7-Hypokalemia  -repleted   8-PAF (paroxysmal atrial fibrillation)  -resolved; and has remained on SR -likely more sporadic isolated event as opposed to true paroxysmal since only occurred when septic  -did have embolic CVA but unable to definitely clarify if etiology solely septic vs thrombo emobilic so will need to consider chronic anti-coagulation -anticoagulation on hold for now due to low hgb and high risk for bleeding at this moment.  -continue ASA  9-Protein-calorie malnutrition, severe / Dehydration -cont imodium to decrease ileo output as needed -Malnutrition major factor contributing to slow recovery/wound healing and 3rd space shifting due to low albumin   -now s/p PEG tube per IR - advancing toward goal rate 75/hr  -tolerating tube feeding.  -will add reglan TID and PRN compazine for nausea  10-Spleen hematoma - subcapsular & without rupture / Intra-abdominal fluid collection/LUQ  -CCS: nothing to do for spleen/self limiting  -continue monitoring -no signs of acute bleeding or abdominal pain.  11-Recent hyperglycemia  -cont SSI  -due to tube feedings  12-Persistent sinus tachy  Likely multifactorial  -Will keep hydrated.   -TSH WNL -low probability on V/Q scan for PE  -better today (inthe 110's) will monitor and if needed will use low dose B-blocker  13-Leukocytosis: WBC trending down. Most likely due to stress demargination and asp PNA.  -UA and urine cx WNL -neg C. Diff -continue holding for ay further abx's at this moment.   Code Status: Full Family Communication: Care discussed with wife who was at bedside.  Disposition Plan: will ask LTACH to examine patient for admission before further CIR.   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  Objective: Filed Vitals:   05/02/12 0557 05/02/12 1404 05/02/12 2146 05/03/12 0510  BP: 101/69 109/70 109/64 106/66  Pulse: 114 114 121 113  Temp: 98.2 F (36.8 C) 98.1 F (36.7 C) 98 F (36.7 C) 98.2 F (36.8 C)  TempSrc: Oral Oral Oral Oral   Resp: 20 20 22 16   Height:      Weight: 68.6 kg (151 lb 3.8 oz)     SpO2: 97% 98% 98% 100%    Intake/Output Summary (Last 24 hours) at 05/03/12 1246 Last data filed at 05/03/12 1120  Gross per 24 hour  Intake    240 ml  Output   1725 ml  Net  -1485 ml   Filed Weights   04/29/12 0742 05/01/12 0543 05/02/12 0557  Weight: 62.3 kg (137 lb 5.6 oz) 75.8 kg (167 lb 1.7 oz) 68.6 kg (151 lb 3.8 oz)    Exam:   General: Slight lethargic, but easily aroused and able to following commands; afebrile; No distress.   Cardiovascular: S 1, S 2 RRR  Respiratory: improved air movement, no wheezes; scattered rhonchi.   Abdomen: peg tube in place, ileostomy stoma stable, wound VAC over midline abdominal incision; clean dressings and wound in healing process.  Musculoskeletal: no edema.   Data Reviewed: Basic Metabolic Panel:  Recent Labs Lab 04/27/12 1137 04/28/12 0602 04/30/12 0524 05/01/12 0600 05/01/12 2329 05/02/12 0415  NA 144 144 147* 145  --  141  K 3.3* 3.7 3.6 4.1  --  4.3  CL 115* 115* 117* 115*  --  111  CO2 19 21 19 22   --  22  GLUCOSE 128* 96 121* 128*  --  115*  BUN 20 19 21 19   --  18  CREATININE 1.16 1.06 0.96 0.88  --  0.86  CALCIUM 7.9* 8.1* 8.1* 7.9*  --  7.9*  MG  --   --   --   --  1.5  --   PHOS  --  2.2*  --   --  2.3  --    Liver Function Tests:  Recent Labs Lab 04/27/12 1137 04/28/12 0602 04/30/12 0524  AST 22  --  15  ALT 19  --  12  ALKPHOS 295*  --  219*  BILITOT 1.0  --  0.9  PROT 4.9*  --  4.9*  ALBUMIN 1.3* 1.2* 1.2*   CBC:  Recent Labs Lab 04/29/12 0500 04/30/12 0524 05/01/12 0600 05/02/12 0415 05/03/12 0520  WBC 21.8* 21.9* 18.5* 18.1* 16.5*  HGB 8.6* 7.5* 7.0* 7.2* 7.3*  HCT 26.6* 23.1* 21.6* 22.1* 22.8*  MCV 94.0 93.1 93.1 93.2 92.3  PLT 373 345 345 376 389   BNP (last 3 results)  Recent Labs  04/10/12 0650  PROBNP 239.6*   CBG:  Recent Labs Lab 05/02/12 1208 05/02/12 1719 05/02/12 2142 05/03/12 0731  05/03/12 1210  GLUCAP 100* 116* 101* 106* 107*    Recent Results (from the past 240 hour(s))  BODY FLUID CULTURE     Status: None   Collection Time    04/25/12 12:25 PM      Result Value Range Status   Specimen Description FLUID PERITONEAL   Final   Special Requests NONE   Final   Gram Stain     Final   Value: NO WBC SEEN     NO ORGANISMS SEEN   Culture NO GROWTH 3 DAYS   Final   Report Status 04/28/2012 FINAL  Final  CULTURE, FUNGUS WITHOUT SMEAR     Status: None   Collection Time    04/25/12 12:26 PM      Result Value Range Status   Specimen Description FLUID PERITONEAL   Final   Special Requests FLUID   Final   Culture CULTURE IN PROGRESS FOR FOUR WEEKS   Final   Report Status PENDING   Incomplete  URINE CULTURE     Status: None   Collection Time    05/01/12  5:30 PM      Result Value Range Status   Specimen Description URINE, CLEAN CATCH   Final   Special Requests NONE   Final   Culture  Setup Time 05/01/2012 18:41   Final   Colony Count NO GROWTH   Final   Culture NO GROWTH   Final   Report Status 05/02/2012 FINAL   Final  CLOSTRIDIUM DIFFICILE BY PCR     Status: None   Collection Time    05/03/12  5:10 AM      Result Value Range Status   C difficile by pcr NEGATIVE  NEGATIVE Final     Studies: Nm Pulmonary Perf And Vent  05/01/2012  *RADIOLOGY REPORT*  Clinical Data: Tachycardia. Shortness of breath and renal insufficiency.  NM PULMONARY VENTILATION AND PERFUSION SCAN  Radiopharmaceutical: CURIE MAA TECHNETIUM TO 38M ALBUMIN AGGREGATED 40 mCi inhalent 80m technetium DTPA  Comparison: Chest x-ray 04/24/2012.  Findings: The diffusion volume of the right lung is diminished, compatible to the left lower lobe airspace disease.  There is mild heterogeneity in both lungs without to a segment significant segmental defect.  There are no significant mismatches.  IMPRESSION: Low probability for pulmonary embolus (10 - 19%) with heterogeneous perfusion but no  discrete to segmental defects.   Original Report Authenticated By: Marin Roberts, M.D.     Scheduled Meds: . aspirin  81 mg Per Tube Daily  . chlorhexidine  15 mL Mouth Rinse BID  . darbepoetin (ARANESP) injection - NON-DIALYSIS  40 mcg Subcutaneous Q Tue-1800  . feeding supplement (VITAL AF 1.2 CAL)  1,000 mL Per Tube Q24H  . ferrous sulfate  300 mg Per Tube TID WC  . free water  200 mL Per Tube Q3H  . insulin aspart  0-5 Units Subcutaneous QHS  . insulin aspart  0-9 Units Subcutaneous TID WC  . lidocaine  1 patch Transdermal Q24H  . lip balm   Topical BID  . loperamide  2 mg Per Tube QHS  . metoCLOPramide  5 mg Oral TID  . multivitamin  5 mL Per Tube Daily  . pantoprazole sodium  40 mg Per Tube Q1200  . potassium chloride  20 mEq Oral BID  . 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    Ciclaly Mulcahey  [facility] Pager  [phone]. If 7PM-7AM, please contact night-coverage at [email or web address], password [facility] 05/03/2012, 12:46 PM  LOS: 8 days

## 2012-05-03 NOTE — Care Management Note (Signed)
  Page 2 of 2   05/07/2012     9:59:14 AM   CARE MANAGEMENT NOTE 05/07/2012  Patient:  Connor Small, Connor Small   Account Number:  [id number]  Date Initiated:  05/01/2012  Documentation initiated by:  Babette Relic  Subjective/Objective Assessment:   52 yr-old xferred from CIR 2/2 aspiration PNA, ascites; lives with spouse     Action/Plan:   Anticipated DC Date:     Anticipated DC Plan:    In-house referral  Clinical Social Worker      DC Forensic scientist  CM consult      Choice offered to / List presented to:             Status of service:   Medicare Important Message given?   (If response is "NO", the following Medicare IM given date fields will be blank) Date Medicare IM given:   Date Additional Medicare IM given:    Discharge Disposition:    Per UR Regulation:  Reviewed for med. necessity/level of care/duration of stay  If discussed at [place] of Stay Meetings, dates discussed:   05/01/2012    Comments:  05-07-12 MD and Mrs Woollard , wanting CIR to reaccess . Spoke with Pamala Hurry from [facility] , she will come see patient and his wife today. Magdalen Spatz RN BSN [phone]   05-03-12 Meet with patient's wife . Mrs Mokry does not want patient to go to [facility] , understands [facility] is out of network . Mrs Bearce wants to look for SNF for rehab for her husband . Confirmed with her that it is ok for social work to start bed search.  Her first choice is [facility] in [place].   She does not want [facility] or [facility].  Magdalen Spatz RN BSN [phone]    05-03-12 Spoke with patient's daughter at bedside . Per daughter her mother will return 1 to 2 hours . Will follow up . Magdalen Spatz RN BSN   05/01/12 0843 Babette Relic RN BSN MSN CCM CIR may not readmit pt 2/2 multiple medical issues/inability to tolerate 3 hrs of therapy.  Spouse previously stated she would not consider SNF placement for pt, she would take him home.  CSW referral for possible SNF vs home with home health.

## 2012-05-04 DIAGNOSIS — Z932 Ileostomy status: Secondary | ICD-10-CM

## 2012-05-04 LAB — GLUCOSE, CAPILLARY: Glucose-Capillary: 102 mg/dL — ABNORMAL HIGH (ref 70–99)

## 2012-05-04 LAB — BASIC METABOLIC PANEL
CO2: 23 mEq/L (ref 19–32)
Calcium: 8.1 mg/dL — ABNORMAL LOW (ref 8.4–10.5)
GFR calc Af Amer: >90 mL/min (ref >90)
GFR calc non Af Amer: >90 mL/min (ref >90)
Sodium: 135 mEq/L (ref 135–145)

## 2012-05-04 NOTE — Clinical Social Work Placement (Signed)
Clinical Social Work Department  CLINICAL SOCIAL WORK PLACEMENT NOTE  05/04/2012  Patient: Connor Small Account Number: [id number]  Admit date: 04/25/12 Clinical Social Worker: Rhea Pink, MSW  Date/time: 05/04/2012 345 PM  Clinical Social Work is seeking post-discharge placement for this patient at the following level of care: SKILLED NURSING (*CSW will update this form in Epic as items are completed)  05/04/2012 Patient/family provided with [facility] Department of Clinical Social Work's list of facilities offering this level of care within the geographic area requested by the patient (or if unable, by the patient's family).   4/11/2014Patient/family informed of their freedom to choose among providers that offer the needed level of care, that participate in Medicare, Medicaid or managed care program needed by the patient, have an available bed and are willing to accept the patient.  05/04/2012 Patient/family informed of [facility]' ownership interest in [facility], as well as of the fact that they are under no obligation to receive care at this facility.  PASARR submitted to EDS on  Pre-existing PASARR number received from EDS on Pre-existing FL2 transmitted to all facilities in geographic area requested by pt/family on 05/04/2012 FL2 transmitted to all facilities within larger geographic area on  Patient informed that his/her managed care company has contracts with or will negotiate with certain facilities, including the following:  Patient/family informed of bed offers received:   Patient chooses bed at  Physician recommends and patient chooses bed at  Patient to be transferred to on  Patient to be transferred to facility by  The following physician request were entered in Epic:  Additional Comments:

## 2012-05-04 NOTE — Progress Notes (Signed)
Agree with above 

## 2012-05-04 NOTE — Progress Notes (Deleted)
Clinical Social Work Department  BRIEF PSYCHOSOCIAL ASSESSMENT  Patient: Connor Small  Account Number: [id number]   Admit date: 04/25/12 Clinical Social Worker Rhea Pink, MSW Date/Time: 05/04/2012 3:00 PM Referred by: Physician Date Referred: 05/04/2012 Referred for   SNF Placement   Other Referral:  Interview type: Patient was asleep int the room. Patient's wife Other interview type: PSYCHOSOCIAL DATA  Living Status: Patient's wife and daughter Admitted from facility: Inpatient Rehab Level of care:   Primary support name: Landau,Amanda Primary support relationship to patient: Wife Degree of support available:  Strong and vested  CURRENT CONCERNS  Current Concerns   Post-Acute Placement   Other Concerns:  SOCIAL WORK ASSESSMENT / PLAN  CSW met with pt re: PT recommendation for SNF.   Pt lives with his family but has been in the hospital since February 2nd per wife  CSW discussed placement process and answered questions. Patient's wife has done a lot of research on facilities and wants [facility] but they do not accept [facility]. SW spoke with patient's wife at length about her frustrations and difficulties. SW listened and provided support to patient's wife. SW will have weekday SW follow up with patient's family.  Pt reports [facility] as her preference    CSW completed FL2 and initiated SNF search.     Assessment/plan status: Information/Referral to [facility]  Other assessment/ plan:  Information/referral to community resources:  Patient already had SNF choice list.     PATIENT'S/FAMILY'S RESPONSE TO PLAN OF CARE:  Patient's wife reports she is agreeable to [facility] SNF in order to increase the patient's strength and independence prior to returning home  Pt's wife  verbalized understanding of placement process and appreciation for CSW assist.   Rhea Pink, MSW [phone]

## 2012-05-04 NOTE — Progress Notes (Signed)
Physical Therapy Treatment Patient Details Name: Connor Small MRN: [id number] DOB: 03/07/1960 Today's Date: 05/04/2012 Time: 2841-3244 PT Time Calculation (min): 31 min  PT Assessment / Plan / Recommendation Comments on Treatment Session  Continues with very slow progress.  Did note increased movement RLE today.  Continues to require +2 assist for all mobility.    Follow Up Recommendations  LTACH;SNF     Does the patient have the potential to tolerate intense rehabilitation     Barriers to Discharge        Equipment Recommendations  None recommended by PT    Recommendations for Other Services    Frequency Min 3X/week   Plan Discharge plan remains appropriate;Frequency remains appropriate    Precautions / Restrictions Precautions Precautions: Fall;Shoulder Type of Shoulder Precautions: R shoulder subluxation Precaution Comments: Abd binder to protect incision, ostomy, tachycardia, Afib, Wound vac, peg tube Required Braces or Orthoses: Other Brace/Splint Other Brace/Splint: R wrist cock up slint Restrictions Weight Bearing Restrictions: No Other Position/Activity Restrictions: support R UE   Pertinent Vitals/Pain     Mobility  Bed Mobility Bed Mobility: Rolling Left;Left Sidelying to Sit;Sitting - Scoot to [place] of Bed;Sit to Sidelying Left;Scooting to [facility] Rolling Left: 1: +2 Total assist Rolling Left: Patient Percentage: 10% Left Sidelying to Sit: 1: +2 Total assist Left Sidelying to Sit: Patient Percentage: 10% Sitting - Scoot to Edge of Bed: 1: +2 Total assist Sitting - Scoot to Edge of Bed: Patient Percentage: 20% Sit to Sidelying Left: 1: +2 Total assist Sit to Sidelying Left: Patient Percentage: 10% Scooting to HOB: 1: +2 Total assist Scooting to [facility]: Patient Percentage: 0% Details for Bed Mobility Assistance: Verbal and tactile cues for technique.  Used bed pad to assist with rolling trunk, with RUE positioned to protect shoulder.  Worked on sitting  balance/posture x 12 minutes with one rest break.  Patient able to use LUE to assist.  Posture very flexed with downward head.  Patient leaning to right, and losing balance posteriorly.  Assist required to bring trunk forward to regain balance.  Was able to maintain balance x 90 seconds with standby assist - however posture flexed.   Transfers Transfers: Not assessed    Exercises General Exercises - Lower Extremity Toe Raises: AROM;Both;5 reps;Seated Heel Raises: AROM;Both;5 reps;Seated     PT Goals Acute Rehab PT Goals PT Goal: Supine/Side to Sit - Progress: Progressing toward goal PT Goal: Sit at [facility] Of Bed - Progress: Progressing toward goal  Visit Information  Last PT Received On: 05/04/12 Assistance Needed: +2    Subjective Data  Subjective: "I need to rest"  (Stated during sitting EOB)   Cognition  Cognition Overall Cognitive Status: Impaired Area of Impairment: Other (comment) (Slow with processing information) Arousal/Alertness: Awake/alert Behavior During Session: [facility] for tasks performed Following Commands: Follows one step commands consistently Cognition - Other Comments: slow processing    Balance  Balance Balance Assessed: Yes Static Sitting Balance Static Sitting - Balance Support: Left upper extremity supported;Feet supported Static Sitting - Level of Assistance: 5: Stand by assistance;3: Mod assist Static Sitting - Comment/# of Minutes: Worked on sitting balance/posture x 12 minutes with one rest break. Patient able to use LUE to assist. Posture very flexed with downward head. Patient leaning to right, and losing balance posteriorly. Assist required to bring trunk forward to regain balance. Was able to maintain balance x 90 seconds with standby assist - however posture flexed.   End of Session PT - End of Session Activity  Tolerance: Patient limited by fatigue Patient left: in bed;with call bell/phone within reach;with family/visitor present Nurse  Communication: Mobility status;Need for lift equipment   GP     Vena Austria 05/04/2012, 2:52 PM Durenda Hurt. Renaldo Fiddler, [facility] Acute Rehab Services Pager [phone]

## 2012-05-04 NOTE — Progress Notes (Signed)
Subjective: Still very weak, his wife says he ate some Pizza yesterday, food doesn't really taste good yet.  No complaints of significant pain.  Objective: Vital signs in last 24 hours: Temp:  [97.2 F (36.2 C)-99.5 F (37.5 C)] 98.5 F (36.9 C) (04/11 0639) Pulse Rate:  [116-120] 118 (04/11 0639) Resp:  [16-18] 16 (04/11 0639) BP: (106-116)/(67-71) 114/69 mmHg (04/11 0639) SpO2:  [98 %-100 %] 100 % (04/11 0639) Last BM Date: 05/03/12 1000 ml TF reported, 875 ml per ostomy, diet: regular scan low  AFEBRILE, some ongoing tachycardia, WBC up yesterday, BMP stable, C diff negative on 05/03/12, s/p paracentesis 750 ml on 04/28/12 Intake/Output from previous day: 04/10 0701 - 04/11 0700 In: 1000 [NG/GT:1000] Out: 2525 [Urine:1650; Stool:875] Intake/Output this shift:    General appearance: alert, cooperative and no distress Resp: clear to auscultation bilaterally and anterior exam Cardio: regular rate and rhythm, S1, S2 normal, no murmur, click, rub or gallop and still tachycardic GI: soft, non-tender; bowel sounds normal; no masses,  no organomegaly and ileostomy functioning, only discomfort with PEG is with irrigation, causes some nausea.  Tolerating TF, open areas are clean and on daily wet to dry dressing changes.  Lab Results:   Recent Labs  05/02/12 0415 05/03/12 0520  WBC 18.1* 16.5*  HGB 7.2* 7.3*  HCT 22.1* 22.8*  PLT 376 389    BMET  Recent Labs  05/02/12 0415 05/04/12 0600  NA 141 135  K 4.3 4.2  CL 111 104  CO2 22 23  GLUCOSE 115* 126*  BUN 18 16  CREATININE 0.86 0.84  CALCIUM 7.9* 8.1*   PT/INR No results found for this basename: LABPROT, INR,  in the last 72 hours   Recent Labs Lab 04/27/12 1137 04/28/12 0602 04/30/12 0524  AST 22  --  15  ALT 19  --  12  ALKPHOS 295*  --  219*  BILITOT 1.0  --  0.9  PROT 4.9*  --  4.9*  ALBUMIN 1.3* 1.2* 1.2*     Lipase     Component Value Date/Time   LIPASE 34 04/02/2012 0424      Studies/Results: No results found.  Medications: . aspirin  81 mg Per Tube Daily  . chlorhexidine  15 mL Mouth Rinse BID  . darbepoetin (ARANESP) injection - NON-DIALYSIS  40 mcg Subcutaneous Q Tue-1800  . feeding supplement (VITAL AF 1.2 CAL)  1,000 mL Per Tube Q24H  . ferrous sulfate  300 mg Per Tube TID WC  . free water  200 mL Per Tube Q3H  . insulin aspart  0-5 Units Subcutaneous QHS  . insulin aspart  0-9 Units Subcutaneous TID WC  . lidocaine  1 patch Transdermal Q24H  . lip balm   Topical BID  . loperamide  2 mg Per Tube QHS  . metoCLOPramide  5 mg Oral TID  . multivitamin  5 mL Per Tube Daily  . pantoprazole sodium  40 mg Per Tube Q1200  . potassium chloride  20 mEq Oral BID  . saccharomyces boulardii  250 mg Oral BID   Scheduled Meds: . aspirin  81 mg Per Tube Daily  . chlorhexidine  15 mL Mouth Rinse BID  . darbepoetin (ARANESP) injection - NON-DIALYSIS  40 mcg Subcutaneous Q Tue-1800  . feeding supplement (VITAL AF 1.2 CAL)  1,000 mL Per Tube Q24H  . ferrous sulfate  300 mg Per Tube TID WC  . free water  200 mL Per Tube Q3H  . insulin  aspart  0-5 Units Subcutaneous QHS  . insulin aspart  0-9 Units Subcutaneous TID WC  . lidocaine  1 patch Transdermal Q24H  . lip balm   Topical BID  . loperamide  2 mg Per Tube QHS  . metoCLOPramide  5 mg Oral TID  . multivitamin  5 mL Per Tube Daily  . pantoprazole sodium  40 mg Per Tube Q1200  . potassium chloride  20 mEq Oral BID  . saccharomyces boulardii  250 mg Oral BID       Assessment/Plan . S/p total abdominal colectomy with ileostomy, s/p multiple takebacks for abdominal closure s/p evisceration. 5 abdominal surgeries prior to admit here.   Dysphagia, TF functional quadriparesis Anemia  ARF CVA's PAF PCM/DECONDITIONING leukocytosis   Plan:  Dressing changes as currently ordered.  Ongoing medical management     LOS: 9 days    Ralph Benavidez 05/04/2012

## 2012-05-04 NOTE — Progress Notes (Signed)
[facility] PROGRESS NOTE  Connor Small ZDG:[id number] DOB: 25-Jan-1960 DOA: 04/25/2012 PCP: No primary provider on file.   1-Pleural effusions L > R w/ HCAP vs Aspiration pneumonitis  -anbx's dc/d 4/2 since completed 5 days of tx  -suspect severe Protein calorie malnutrition contributing to fluid accumulations including pleural effusions  -Afebrile, no further hypoxemia.  -no antibiotics indicated at this point  2-Acute renal failure on CKD stage 2, GFR 60-89 ml/min - resolved  -maintained good UOP - GFR now normal  -will monitor  3-Anemia of chronic disease & of critical illness  -hgb stable although baseline around 9.5  -hemodynamically stable.  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  -continue per tube ASA per previous Neuro recs  -once tolerates enteral feeds/PO intake will need to consider full oral anticoag if pt is observed to return to afib   7-Hypokalemia  -repleted   8-PAF (paroxysmal atrial fibrillation)  -resolved; and has remained on SR -likely more sporadic isolated event as opposed to true paroxysmal since only occurred when septic  -did have embolic CVA but unable to definitely clarify if etiology solely septic vs thrombo emobilic so will need to consider chronic anti-coagulation -anticoagulation on hold for now due to low hgb and high risk for bleeding at this moment.  -continue ASA  9-Protein-calorie malnutrition, severe / Dehydration -cont imodium to decrease ileo  output as needed -Malnutrition major factor contributing to slow recovery/wound healing and 3rd space shifting due to low albumin  -now s/p PEG tube per IR - advancing toward goal rate 75/hr  -tolerating tube feeding.  -will continue reglan TID and PRN compazine for nausea  10-Spleen hematoma - subcapsular & without rupture / Intra-abdominal fluid collection/LUQ  -CCS: nothing to do for spleen/self limiting  -continue monitoring -no signs of acute bleeding or abdominal pain.  11-Recent hyperglycemia  -cont SSI  -due to tube feedings -CBG's in the 110's-120's range  12-Persistent sinus tachy  Likely multifactorial  -no CP or SOB -Will keep hydrated.   -TSH WNL -low probability on V/Q scan for PE  -better today (inthe 110's) will monitor and if needed will use low dose B-blocker  13-Leukocytosis: WBC trending down. Most likely due to stress demargination and asp PNA.  -UA and urine cx WNL -neg C. Diff -continue holding for ay further abx's at this moment.   Code Status: Full Family Communication: Care discussed with wife who was at bedside.  Disposition Plan: SNF for further rehab. Due to issues with insurance company patient not a candidate to family's selected LTAC.    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  Objective: Filed Vitals:   05/04/12 0157  05/04/12 0205 05/04/12 0639 05/04/12 1416  BP:   114/69 114/74  Pulse:   118 122  Temp: 98.5 F (36.9 C) 98.1 F (36.7 C) 98.5 F (36.9 C) 98.8 F (37.1 C)  TempSrc: Oral Oral Oral Oral  Resp:   16 16  Height:      Weight:      SpO2:   100% 98%    Intake/Output Summary (Last 24 hours) at 05/04/12 2110 Last data filed at 05/04/12 1719  Gross per 24 hour  Intake    800 ml  Output   1725 ml  Net   -925 ml   Filed Weights   04/29/12 0742 05/01/12 0543 05/02/12 0557  Weight: 62.3 kg (137 lb 5.6 oz) 75.8 kg (167 lb 1.7 oz) 68.6 kg (151 lb 3.8 oz)    Exam:   General: Slight lethargic, but easily aroused and able to follow commands; afebrile; No distress.   Cardiovascular: S 1, S 2 RRR  Respiratory: improved air movement, no wheezes; scattered rhonchi.   Abdomen: peg tube in place, ileostomy stoma stable, wound VAC over midline abdominal incision; clean dressings and wound in healing process. Mild distension, no significant tenderness. Positive BS  Musculoskeletal: no edema. Decrease range of motion by commands on his right side; also with spasticity on his RLE.  Data Reviewed: Basic Metabolic Panel:  Recent Labs Lab 04/28/12 0602 04/30/12 0524 05/01/12 0600 05/01/12 2329 05/02/12 0415 05/04/12 0600  NA 144 147* 145  --  141 135  K 3.7 3.6 4.1  --  4.3 4.2  CL 115* 117* 115*  --  111 104  CO2 21 19 22   --  22 23  GLUCOSE 96 121* 128*  --  115* 126*  BUN 19 21 19   --  18 16  CREATININE 1.06 0.96 0.88  --  0.86 0.84  CALCIUM 8.1* 8.1* 7.9*  --  7.9* 8.1*  MG  --   --   --  1.5  --   --   PHOS 2.2*  --   --  2.3  --   --    Liver Function Tests:  Recent Labs Lab 04/28/12 0602 04/30/12 0524  AST  --  15  ALT  --  12  ALKPHOS  --  219*  BILITOT  --  0.9  PROT  --  4.9*  ALBUMIN 1.2* 1.2*   CBC:  Recent Labs Lab 04/29/12 0500 04/30/12 0524 05/01/12 0600 05/02/12 0415 05/03/12 0520  WBC 21.8* 21.9* 18.5* 18.1* 16.5*  HGB 8.6* 7.5* 7.0* 7.2* 7.3*   HCT 26.6* 23.1* 21.6* 22.1* 22.8*  MCV 94.0 93.1 93.1 93.2 92.3  PLT 373 345 345 376 389   BNP (last 3 results)  Recent Labs  04/10/12 0650  PROBNP 239.6*   CBG:  Recent Labs Lab 05/03/12 2135 05/04/12 0744 05/04/12 1216 05/04/12 1715 05/04/12 2050  GLUCAP 112* 102* 126* 102* 110*    Recent Results (from the past 240 hour(s))  BODY FLUID CULTURE     Status: None   Collection Time    04/25/12 12:25 PM      Result Value Range Status   Specimen Description FLUID PERITONEAL   Final   Special Requests NONE   Final  Gram Stain     Final   Value: NO WBC SEEN     NO ORGANISMS SEEN   Culture NO GROWTH 3 DAYS   Final   Report Status 04/28/2012 FINAL   Final  CULTURE, FUNGUS WITHOUT SMEAR     Status: None   Collection Time    04/25/12 12:26 PM      Result Value Range Status   Specimen Description FLUID PERITONEAL   Final   Special Requests FLUID   Final   Culture CULTURE IN PROGRESS FOR FOUR WEEKS   Final   Report Status PENDING   Incomplete  URINE CULTURE     Status: None   Collection Time    05/01/12  5:30 PM      Result Value Range Status   Specimen Description URINE, CLEAN CATCH   Final   Special Requests NONE   Final   Culture  Setup Time 05/01/2012 18:41   Final   Colony Count NO GROWTH   Final   Culture NO GROWTH   Final   Report Status 05/02/2012 FINAL   Final  CLOSTRIDIUM DIFFICILE BY PCR     Status: None   Collection Time    05/03/12  5:10 AM      Result Value Range Status   C difficile by pcr NEGATIVE  NEGATIVE Final     Studies: No results found.  Scheduled Meds: . aspirin  81 mg Per Tube Daily  . chlorhexidine  15 mL Mouth Rinse BID  . darbepoetin (ARANESP) injection - NON-DIALYSIS  40 mcg Subcutaneous Q Tue-1800  . feeding supplement (VITAL AF 1.2 CAL)  1,000 mL Per Tube Q24H  . ferrous sulfate  300 mg Per Tube TID WC  . free water  200 mL Per Tube Q3H  . insulin aspart  0-5 Units Subcutaneous QHS  . insulin aspart  0-9 Units  Subcutaneous TID WC  . lidocaine  1 patch Transdermal Q24H  . lip balm   Topical BID  . loperamide  2 mg Per Tube QHS  . metoCLOPramide  5 mg Oral TID  . multivitamin  5 mL Per Tube Daily  . pantoprazole sodium  40 mg Per Tube Q1200  . potassium chloride  20 mEq Oral BID  . 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    [facility]  [facility] Pager [phone]. If 7PM-7AM, please contact night-coverage at [email or web address], password [facility] 05/04/2012, 9:10 PM  LOS: 9 days

## 2012-05-04 NOTE — Clinical Social Work Psychosocial (Signed)
Clinical Social Work Department  BRIEF PSYCHOSOCIAL ASSESSMENT  Patient: Connor Small Account Number: [id number] Admit date: 04/25/12  Clinical Social Worker Rhea Pink, MSW Date/Time: 05/04/2012 3:00 PM  Referred by: Physician Date Referred: 05/04/2012  Referred for   SNF Placement   Other Referral:  Interview type: Patient was asleep int the room. Patient's wife  Other interview type:  PSYCHOSOCIAL DATA  Living Status: Patient's wife and daughter  Admitted from facility: Inpatient Rehab  Level of care:  Primary support name: Kurihara,Amanda  Primary support relationship to patient: Wife  Degree of support available:  Strong and vested   CURRENT CONCERNS  Current Concerns   Post-Acute Placement   Other Concerns:  SOCIAL WORK ASSESSMENT / PLAN  CSW met with pt re: PT recommendation for SNF.   Pt lives with his family but has been in the hospital since February 2nd per wife   CSW discussed placement process and answered questions. Patient's wife has done a lot of research on facilities and wants [facility] but they do not accept [employer]. SW spoke with patient's wife at length about her frustrations and difficulties. SW listened and provided support to patient's wife. SW will have weekday SW follow up with patient's family.   Pt reports [facility] as her preference   CSW completed FL2 and initiated SNF search.     Assessment/plan status: Information/Referral to [employer]  Other assessment/ plan:  Information/referral to community resources:  Patient already had SNF choice list.     PATIENT'S/FAMILY'S RESPONSE TO PLAN OF CARE:  Patient's wife reports she is agreeable to [facility] in order to increase the patient's strength and independence prior to returning home Pt's wife verbalized understanding of placement process and appreciation for CSW assist.   Rhea Pink, MSW  [phone]

## 2012-05-04 NOTE — Progress Notes (Signed)
NUTRITION FOLLOW UP  Intervention:   1. Enteral nutrition; Undergoing 10 mL advancement q 6 hrs during the day- suggest 8am and 2pm to goal of 75 mL/hr.  Pt PO intake in the past has been negligible and inconsistent.  Please inform RD of any decisions r/t to holding or transitioning to a different regimen. 2.  Nutrition-related medications; MVI, liquid per tube until pt able to achieve 50 mL/hr continuous feeds consistently. 4. RD to continue to follow nutrition care plan and for pt's progress with SLP and d/c planning.   Nutrition Dx:   Inadequate oral intake, ongoing.   Goal:  Intake to meet >90% of estimated nutrition needs. Unmet,.  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  Pt with acute respiratory and renal failure with ARDS and septic shock. Pt recently discharged to CIR, however currently admitted as inpatient due to possible aspiration PNA (4/2).  Hospital course nutrition summary:  2/2 - 2/20: permissively underfed with EN  2/21-2/26: advanced to full feeds  2/26 - 3/3: feeds held for surgery  3/4 - 3/11: resumed full feeds  3/11 - 3/12: feeds held for GI bleed  3/13 - 3/24: received full feeds  3/24: TFs discontinued diet initiated; pt with very poor oral intake; tx to rehab 3/25  3/28 - 4/1: increased n/v - pt refusing almost all meals and supplements; wife declined calorie count, requesting TPN  4/2: NGT placed to suction, surgery ordering TF initiation; tx back to acute with plans for trickle feedings. 4/3-4/4:  Trickle feeds of Vital 1.2 @ 30 mL/hr 4/5: PEG placement, resume of Vital 1.2 4/6-4/9:  Inconsistent TF infusion and frequent adjustment of rate.  Pt has not yet reached  >40 mL/hr with tolerance.  Wt now 151 lbs. 4/10-11:  Pt has advanced as scheduled to 60 mL/hr.  Discussed RN.  Plan to increase to 70 mL this afternoon.  Residual:  50 mL at last check.    Pt continues to meet criteria for severe malnutrition of acute illness given 29% wt loss in >1 month and PO/nutrition support meeting </=75% of estimated needs for >1 month.  Pt is currently receiving Vital AF 1.2 at 50 ml/hr now via PEG. This provides 1440 kcal, 90g protein, and 973 ml free water. RD received consult (4/7) to increase tube feeds as tolerated- orders remains active (4/11).  Pt has persistently struggled with nausea since admission which contributed to inability to tolerate TFs. Pt has NOT complained of nausea since starting trickle feeds with slow advancements.  If POs resumed and pt c/o nausea, consider limiting POs to small boluses and continuing TFs at last known tolerated rate (currently 50 mL/hr).    RD strongly recommends ongoing nutrition support for pt with continued PO intake at meals and participation in [place] for swallowing improvement and long-term goal of resuming full PO feeds.  Pt still has not yet reached goal volume of continuous TFs and is not likely capable of consuming adequate nutrition with PO alone at this time due to lethargy, deconditioning, poor appetite, and early satiety.  Would recommend making adjustments to TF based on PO performance with SLP.  Note pt's hesitancy with PO intake and limited acceptance. Pt reports struggling with poor taste.    Height:  Ht Readings from Last 1 Encounters:  04/25/12 5' 9"  (1.753 m)    Weight Status:   Wt Readings from Last 1 Encounters:  05/02/12 151 lb 3.8 oz (68.6 kg)    Re-estimated needs:  Kcal: 2200-2400 Protein: 120-140g Fluid: >2.2 L/day  Skin: incisions, ileostomy, otherwise intact  Diet Order: General   Intake/Output Summary (Last 24 hours) at 05/04/12 1407 Last data filed at 05/04/12 0647  Gross per 24 hour   Intake   1000 ml  Output   2350 ml  Net  -1350 ml    Last BM: 4/9   Labs:   Recent Labs Lab 04/28/12 0602  05/01/12 0600 05/01/12 2329 05/02/12 0415 05/04/12 0600  NA 144  < > 145  --  141 135  K 3.7  < > 4.1  --  4.3 4.2  CL 115*  < > 115*  --  111 104  CO2 21  < > 22  --  22 23  BUN 19  < > 19  --  18 16  CREATININE 1.06  < > 0.88  --  0.86 0.84  CALCIUM 8.1*  < > 7.9*  --  7.9* 8.1*  MG  --   --   --  1.5  --   --   PHOS 2.2*  --   --  2.3  --   --   GLUCOSE 96  < > 128*  --  115* 126*  < > = values in this interval not displayed.  CBG (last 3)   Recent Labs  05/03/12 2135 05/04/12 0744 05/04/12 1216  GLUCAP 112* 102* 126*    Scheduled Meds: . aspirin  81 mg Per Tube Daily  . chlorhexidine  15 mL Mouth Rinse BID  . darbepoetin (ARANESP) injection - NON-DIALYSIS  40 mcg Subcutaneous Q Tue-1800  . feeding supplement (VITAL AF 1.2 CAL)  1,000 mL Per Tube Q24H  . ferrous sulfate  300 mg Per Tube TID WC  . free water  200 mL Per Tube Q3H  . insulin aspart  0-5 Units Subcutaneous QHS  . insulin aspart  0-9 Units Subcutaneous TID WC  . lidocaine  1 patch Transdermal Q24H  . lip balm   Topical BID  . loperamide  2 mg Per Tube QHS  . metoCLOPramide  5 mg Oral TID  . multivitamin  5 mL Per Tube Daily  . pantoprazole sodium  40 mg Per Tube Q1200  . potassium chloride  20 mEq Oral BID  . saccharomyces boulardii  250 mg Oral BID    Continuous Infusions: none   Brynda Greathouse, MS RD LDN Clinical Inpatient Dietitian Pager: [phone] Weekend/After hours pager: [phone]

## 2012-05-04 NOTE — Consult Note (Addendum)
WOC ostomy consult  CCS following for assessment and plan of care to abd wound. Stoma type/location: Ileostomy to right lower quad Stomal assessment/size: Stoma red and viable, flush with skin level, 1 inch Peristomal assessment: Intact skin surrounding Output 100cc liquid brown stool Ostomy pouching: 1pc. with barrier ring   Education provided: Demonstrated pouch change, pt and wife feel comfortable with the process at this point. Applied barrier ring and one piece pouch to maintain seal.  [place] discharge samples have been received at home.  Wife feels independent with opening and closing velcro to empty and deny further questions at this time. Supplies ordered to room for staff use. Julien Girt MSN, RN, [place], [place], [place]

## 2012-05-05 LAB — GLUCOSE, CAPILLARY
Glucose-Capillary: 113 mg/dL — ABNORMAL HIGH (ref 70–99)
Glucose-Capillary: 89 mg/dL (ref 70–99)

## 2012-05-05 MED ORDER — METOPROLOL SUCCINATE 12.5 MG HALF TABLET
12.5000 mg | ORAL_TABLET | Freq: Every day | ORAL | Status: DC
  Administered 2012-05-05 – 2012-05-06 (×2): 12.5 mg via ORAL
  Filled 2012-05-05 (×3): qty 1

## 2012-05-05 MED ORDER — LORAZEPAM 2 MG/ML IJ SOLN
0.5000 mg | Freq: Two times a day (BID) | INTRAMUSCULAR | Status: DC | PRN
  Administered 2012-05-05 – 2012-05-07 (×2): 0.5 mg via INTRAVENOUS
  Filled 2012-05-05 (×2): qty 1

## 2012-05-05 NOTE — Progress Notes (Signed)
[facility] PROGRESS NOTE  ADVITH JANOTA ZOX:[id number] DOB: 02-28-1960 DOA: 04/25/2012 PCP: No primary provider on file.   1-Pleural effusions L > R w/ HCAP vs Aspiration pneumonitis  -anbx's dc/d 4/2 since completed 5 days of tx  -suspect severe Protein calorie malnutrition contributing to fluid accumulations including pleural effusions  -Afebrile, no further hypoxemia.  -no antibiotics indicated at this point -continue IS  2-Acute renal failure on CKD stage 2, GFR 60-89 ml/min - resolved  -maintained good UOP - GFR now normal  -will monitor  3-Anemia of chronic disease & of critical illness  -hgb stable although baseline around 9.5  -hemodynamically stable.  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 But as requested by family will ask CIR to reevaluate on Monday for possible acceptance instead of SNF.  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  -continue per tube ASA per previous Neuro recs  -once tolerates enteral feeds/PO intake will need to consider full oral anticoag if pt is observed to return to afib   7-Hypokalemia  -repleted   8-PAF (paroxysmal atrial fibrillation)  -resolved; and has remained on SR -likely more sporadic isolated event as opposed to true paroxysmal since only occurred when septic  -did have embolic CVA but unable to definitely clarify if etiology solely septic vs thrombo emobilic so will need to consider chronic anti-coagulation -anticoagulation on hold for now due to low hgb and high risk for bleeding at  this moment.  -continue ASA -started on b-blocker low dose to help controlling tachycardia (sinus)  9-Protein-calorie malnutrition, severe / Dehydration -cont imodium to decrease ileo output as needed -Malnutrition major factor contributing to slow recovery/wound healing and 3rd space shifting due to low albumin  -now s/p PEG tube per IR - at goal rate 75/hr  -tolerating tube feeding.  -will continue reglan TID and PRN compazine for nausea  10-Spleen hematoma - subcapsular & without rupture / Intra-abdominal fluid collection/LUQ  -CCS: nothing to do for spleen/self limiting  -continue monitoring -no signs of acute bleeding or abdominal pain.  11-Recent hyperglycemia  -cont SSI  -due to tube feedings -CBG's in the 110's-120's range  12-Persistent sinus tachy  Likely multifactorial  -no CP or SOB -Will keep hydrated.   -TSH WNL -low probability on V/Q scan for PE  -will use B-blocker as patient still tachycardic in the 120's  13-Leukocytosis: WBC trending down. Most likely due to stress demargination and asp PNA.  -UA and urine cx WNL -neg C. Diff -continue holding for ay further abx's at this moment. -CBC in am   Code Status: Full Family Communication: Care discussed with wife who was at bedside.  Disposition Plan: SNF for further rehab. Due to issues with insurance company patient not a candidate to family's selected LTAC.    Consultants: CCS   Procedures: 2/03- Flex sig  2/03 - Decompressive Cecostomy due to colonic stricture and megacolon  2/04 - Total abdominal colectomy and end ileostomy for toxic megacolon  2/09- Thoracentesis  2/26-Cystoscpy w/ BRP  3/03-EL/Liver bx/appliation wound VAC  3/06-EL/open abd wound VAC change  3/09-removal VAC dressing and abdominal closure  4/5 - PEG tube per IR    Antibiotics: PCN, Imipenem ALLERGIC - rash  S/p full treatment x 3 weeks for cdiff  Aztreonam 3/29>>> 3/31  vanc 3/29>>> 3/31   HPI/Subjective: Patient  afebrile; overall feeling better and tolerating advance of TF's up to 75cc/hr (goal); no nausea, no vomiting or abdominal pain. Patient still tachycardic   Objective: Filed Vitals:   05/04/12 1416 05/04/12 2157 05/05/12 0545 05/05/12 1521  BP: 114/74 109/66 107/67 120/69  Pulse: 122 113 118 128  Temp: 98.8 F (37.1 C) 97.8 F (36.6 C) 98.2 F (36.8 C) 98.8 F (37.1 C)  TempSrc: Oral Oral Oral Oral  Resp: 16 17 18 18   Height:      Weight:      SpO2: 98% 98% 99% 99%    Intake/Output Summary (Last 24 hours) at 05/05/12 1529 Last data filed at 05/05/12 1500  Gross per 24 hour  Intake   2920 ml  Output   2250 ml  Net    670 ml   Filed Weights   04/29/12 0742 05/01/12 0543 05/02/12 0557  Weight: 62.3 kg (137 lb 5.6 oz) 75.8 kg (167 lb 1.7 oz) 68.6 kg (151 lb 3.8 oz)    Exam:   General: Slight lethargic, but easily aroused and able to follow commands; afebrile; No distress.   Cardiovascular: S 1, S 2 RRR  Respiratory: improved air movement, no wheezes; scattered rhonchi.   Abdomen: peg tube in place, ileostomy stoma stable, wound VAC over midline abdominal incision; clean dressings and wound in healing process. Mild distension, no significant tenderness. Positive BS  Musculoskeletal: no edema. Decrease range of motion by commands on his right side; also with spasticity on his RLE.  Data Reviewed: Basic Metabolic Panel:  Recent Labs Lab 04/30/12 0524 05/01/12 0600 05/01/12 2329 05/02/12 0415 05/04/12 0600  NA 147* 145  --  141 135  K 3.6 4.1  --  4.3 4.2  CL 117* 115*  --  111 104  CO2 19 22  --  22 23  GLUCOSE 121* 128*  --  115* 126*  BUN 21 19  --  18 16  CREATININE 0.96 0.88  --  0.86 0.84  CALCIUM 8.1* 7.9*  --  7.9* 8.1*  MG  --   --  1.5  --   --   PHOS  --   --  2.3  --   --    Liver Function Tests:  Recent Labs Lab 04/30/12 0524  AST 15  ALT 12  ALKPHOS 219*  BILITOT 0.9  PROT 4.9*  ALBUMIN 1.2*   CBC:  Recent Labs Lab 04/29/12 0500  04/30/12 0524 05/01/12 0600 05/02/12 0415 05/03/12 0520  WBC 21.8* 21.9* 18.5* 18.1* 16.5*  HGB 8.6* 7.5* 7.0* 7.2* 7.3*  HCT 26.6* 23.1* 21.6* 22.1* 22.8*  MCV 94.0 93.1 93.1 93.2 92.3  PLT 373 345 345 376 389   BNP (last 3 results)  Recent Labs  04/10/12 0650  PROBNP 239.6*   CBG:  Recent Labs Lab 05/04/12 1216 05/04/12 1715 05/04/12 2050 05/05/12 0804 05/05/12 1252  GLUCAP 126* 102* 110* 113* 89    Recent Results (from the past 240 hour(s))  URINE CULTURE     Status: None   Collection Time    05/01/12  5:30 PM      Result Value Range Status   Specimen Description URINE, CLEAN CATCH   Final   Special Requests NONE  Final   Culture  Setup Time 05/01/2012 18:41   Final   Colony Count NO GROWTH   Final   Culture NO GROWTH   Final   Report Status 05/02/2012 FINAL   Final  CLOSTRIDIUM DIFFICILE BY PCR     Status: None   Collection Time    05/03/12  5:10 AM      Result Value Range Status   C difficile by pcr NEGATIVE  NEGATIVE Final     Studies: No results found.  Scheduled Meds: . aspirin  81 mg Per Tube Daily  . chlorhexidine  15 mL Mouth Rinse BID  . darbepoetin (ARANESP) injection - NON-DIALYSIS  40 mcg Subcutaneous Q Tue-1800  . feeding supplement (VITAL AF 1.2 CAL)  1,000 mL Per Tube Q24H  . ferrous sulfate  300 mg Per Tube TID WC  . free water  200 mL Per Tube Q3H  . insulin aspart  0-5 Units Subcutaneous QHS  . insulin aspart  0-9 Units Subcutaneous TID WC  . lidocaine  1 patch Transdermal Q24H  . lip balm   Topical BID  . loperamide  2 mg Per Tube QHS  . metoCLOPramide  5 mg Oral TID  . metoprolol succinate  12.5 mg Oral Daily  . multivitamin  5 mL Per Tube Daily  . pantoprazole sodium  40 mg Per Tube Q1200  . potassium chloride  20 mEq Oral BID  . 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    [facility]  [facility] Pager [phone]. If 7PM-7AM, please contact night-coverage at [email or web address], password [facility] 05/05/2012, 3:29 PM  LOS: 10 days

## 2012-05-05 NOTE — Progress Notes (Signed)
MEDICATION RELATED CONSULT NOTE - F/U   Pharmacy Consult for Aranesp Indication: Anemia of Chronic Illness  Allergies  Allergen Reactions  . Penicillins Other (See Comments)    Heart rate changes  . Morphine And Related     Nausea/vomiting  . Oxycodone     Nausea/vomiting  . Imipenem Rash    Patient Measurements: Height: 5' 9"  (175.3 cm) Weight: 151 lb 3.8 oz (68.6 kg) IBW/kg (Calculated) : 70.7  Vital Signs: Temp: 98.2 F (36.8 C) (04/12 0545) Temp src: Oral (04/12 0545) BP: 107/67 mmHg (04/12 0545) Pulse Rate: 118 (04/12 0545) Intake/Output from previous day: 04/11 0701 - 04/12 0700 In: 2370 [P.O.:50; NG/GT:1200] Out: 1700 [Urine:1400; Stool:300] Intake/Output from this shift:    Labs:  Recent Labs  05/03/12 0520 05/04/12 0600  WBC 16.5*  --   HGB 7.3*  --   HCT 22.8*  --   PLT 389  --   CREATININE  --  0.84   Estimated Creatinine Clearance: 97.5 ml/min (by C-G formula based on Cr of 0.84).   Microbiology: Recent Results (from the past 720 hour(s))  CULTURE, EXPECTORATED SPUTUM-ASSESSMENT     Status: None   Collection Time    04/06/12  1:00 PM      Result Value Range Status   Specimen Description SPUTUM   Final   Special Requests Normal   Final   Sputum evaluation     Final   Value: THIS SPECIMEN IS ACCEPTABLE. RESPIRATORY CULTURE REPORT TO FOLLOW.   Report Status 04/06/2012 FINAL   Final  CULTURE, RESPIRATORY (NON-EXPECTORATED)     Status: None   Collection Time    04/06/12  1:00 PM      Result Value Range Status   Specimen Description SPUTUM   Final   Special Requests NONE   Final   Gram Stain     Final   Value: FEW WBC PRESENT,BOTH PMN AND MONONUCLEAR     MODERATE SQUAMOUS EPITHELIAL CELLS PRESENT     ABUNDANT GRAM POSITIVE COCCI IN CLUSTERS     IN PAIRS IN CHAINS   Culture NORMAL OROPHARYNGEAL FLORA   Final   Report Status 04/08/2012 FINAL   Final  URINE CULTURE     Status: None   Collection Time    04/10/12  2:49 PM      Result Value  Range Status   Specimen Description URINE, CLEAN CATCH   Final   Special Requests NONE   Final   Culture  Setup Time 04/10/2012 15:12   Final   Colony Count 30,000 COLONIES/ML   Final   Culture ENTEROBACTER CLOACAE   Final   Report Status 04/14/2012 FINAL   Final   Organism ID, Bacteria ENTEROBACTER CLOACAE   Final  CULTURE, BLOOD (ROUTINE X 2)     Status: None   Collection Time    04/21/12  8:40 AM      Result Value Range Status   Specimen Description BLOOD LEFT ARM   Final   Special Requests BOTTLES DRAWN AEROBIC AND ANAEROBIC 10CC   Final   Culture  Setup Time 04/21/2012 18:02   Final   Culture NO GROWTH 5 DAYS   Final   Report Status 04/27/2012 FINAL   Final  CULTURE, BLOOD (ROUTINE X 2)     Status: None   Collection Time    04/21/12  8:50 AM      Result Value Range Status   Specimen Description BLOOD LEFT ARM   Final   Special  Requests BOTTLES DRAWN AEROBIC ONLY 10CC   Final   Culture  Setup Time 04/21/2012 18:02   Final   Culture NO GROWTH 5 DAYS   Final   Report Status 04/27/2012 FINAL   Final  BODY FLUID CULTURE     Status: None   Collection Time    04/25/12 12:25 PM      Result Value Range Status   Specimen Description FLUID PERITONEAL   Final   Special Requests NONE   Final   Gram Stain     Final   Value: NO WBC SEEN     NO ORGANISMS SEEN   Culture NO GROWTH 3 DAYS   Final   Report Status 04/28/2012 FINAL   Final  CULTURE, FUNGUS WITHOUT SMEAR     Status: None   Collection Time    04/25/12 12:26 PM      Result Value Range Status   Specimen Description FLUID PERITONEAL   Final   Special Requests 60ML FLUID   Final   Culture CULTURE IN PROGRESS FOR FOUR WEEKS   Final   Report Status PENDING   Incomplete  URINE CULTURE     Status: None   Collection Time    05/01/12  5:30 PM      Result Value Range Status   Specimen Description URINE, CLEAN CATCH   Final   Special Requests NONE   Final   Culture  Setup Time 05/01/2012 18:41   Final   Colony Count NO GROWTH    Final   Culture NO GROWTH   Final   Report Status 05/02/2012 FINAL   Final  CLOSTRIDIUM DIFFICILE BY PCR     Status: None   Collection Time    05/03/12  5:10 AM      Result Value Range Status   C difficile by pcr NEGATIVE  NEGATIVE Final    Medical History: Past Medical History  Diagnosis Date  . Chronic diarrhea   . Rectal bleed   . Hemorrhoids   . Ulcerative colitis     Distal UC over 8 yrs ago diagnosed  . Diverticulitis of large intestine with perforation 10/2011    done at [place]  . 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 history of UC (ulcerative colitis) 12/06/2010    Medications:  Scheduled:  . aspirin  81 mg Per Tube Daily  . chlorhexidine  15 mL Mouth Rinse BID  . darbepoetin (ARANESP) injection - NON-DIALYSIS  40 mcg Subcutaneous Q Tue-1800  . feeding supplement (VITAL AF 1.2 CAL)  1,000 mL Per Tube Q24H  . ferrous sulfate  300 mg Per Tube TID WC  . free water  200 mL Per Tube Q3H  . insulin aspart  0-5 Units Subcutaneous QHS  . insulin aspart  0-9 Units Subcutaneous TID WC  . lidocaine  1 patch Transdermal Q24H  . lip balm   Topical BID  . loperamide  2 mg Per Tube QHS  . metoCLOPramide  5 mg Oral TID  . multivitamin  5 mL Per Tube Daily  . pantoprazole sodium  40 mg Per Tube Q1200  . potassium chloride  20 mEq Oral BID  . saccharomyces boulardii  250 mg Oral BID    Assessment: 52 year old male who has been hospitalized since 02/26/12.  His hemoglobin is currently below his baseline of 9.5, and Aranesp is requested by [facility].  He is currently receiving Ferrous Sulfate po TID. Iron studies with a low iron TSAT <20%.   Goal  of Therapy:  Hgb increase of at least 1 gm/dL after 4 weeks of Aranesp  Plan:  Consider starting IV iron as Aranesp will not increase Hgb if patient is iron depleted  Aranesp 37mg SQ qweek Check CBC and Reticulocyte count at least weekly while on Aranesp.   Thank you, LExcell Seltzer Pharm.D. Clinical Pharmacist   Pager: [phone]/12/2012  10:17 AM

## 2012-05-06 DIAGNOSIS — E870 Hyperosmolality and hypernatremia: Secondary | ICD-10-CM

## 2012-05-06 LAB — GLUCOSE, CAPILLARY
Glucose-Capillary: 109 mg/dL — ABNORMAL HIGH (ref 70–99)
Glucose-Capillary: 95 mg/dL (ref 70–99)

## 2012-05-06 LAB — CBC
HCT: 21.5 % — ABNORMAL LOW (ref 39.0–52.0)
Hemoglobin: 7 g/dL — ABNORMAL LOW (ref 13.0–17.0)
MCH: 29.4 pg (ref 26.0–34.0)
MCV: 90.3 fL (ref 78.0–100.0)
RBC: 2.38 MIL/uL — ABNORMAL LOW (ref 4.22–5.81)

## 2012-05-06 NOTE — Progress Notes (Signed)
[facility] PROGRESS NOTE  Connor Small ZOX:[id number] DOB: [birth year] DOA: 04/25/2012 PCP: No primary provider on file.   1-Pleural effusions L > R w/ HCAP vs Aspiration pneumonitis  -anbx's dc/d 4/2 since completed 5 days of tx  -suspect severe Protein calorie malnutrition contributing to fluid accumulations including pleural effusions  -Afebrile, no further hypoxemia.  -no antibiotics indicated at this point -continue IS  2-Acute renal failure on CKD stage 2, GFR 60-89 ml/min - resolved  -maintained good UOP - GFR now normal  -will monitor  3-Anemia of chronic disease & of critical illness  -hgb stable although baseline around 9.5  -hemodynamically stable.  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 But as requested by family will ask CIR to reevaluate on Monday for possible acceptance instead of SNF.  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  -continue per tube ASA per previous Neuro recs  -once tolerates enteral feeds/PO intake will need to consider full oral anticoag if pt is observed to return to afib   7-Hypokalemia  -repleted   8-PAF (paroxysmal atrial fibrillation)  -resolved; and has remained on SR -likely more sporadic isolated event as opposed to true paroxysmal since only occurred when septic  -did have embolic CVA but unable to definitely clarify if etiology solely septic vs thrombo emobilic so will need to consider chronic anti-coagulation -anticoagulation on hold for now due to low hgb and high risk for bleeding at  this moment.  -continue ASA -started on b-blocker low dose to help controlling tachycardia (sinus)  9-Protein-calorie malnutrition, severe / Dehydration -cont imodium to decrease ileo output as needed -Malnutrition major factor contributing to slow recovery/wound healing and 3rd space shifting due to low albumin  -now s/p PEG tube per IR - at goal rate 75/hr  -tolerating tube feeding.  -will continue reglan TID and PRN compazine for nausea  10-Spleen hematoma - subcapsular & without rupture / Intra-abdominal fluid collection/LUQ  -CCS: nothing to do for spleen/self limiting  -continue monitoring -no signs of acute bleeding or abdominal pain.  11-Recent hyperglycemia  -cont SSI  -due to tube feedings -CBG's in the 110's-120's range  12-Persistent sinus tachy  Likely multifactorial  -no CP or SOB -Will keep hydrated.   -TSH WNL -low probability on V/Q scan for PE  -will cont B-blocker; HR better and BP stable.  13-Leukocytosis: WBC trending down. Most likely due to stress demargination and asp PNA.  -UA and urine cx WNL -neg C. Diff -continue holding for ay further abx's at this moment.   Code Status: Full Family Communication: Care discussed with wife who was at bedside.  Disposition Plan: SNF for further rehab. Due to issues with insurance company patient not a candidate to family's selected LTAC.   Consultants: CCS  Procedures: 2/03- Flex sig  2/03 - Decompressive Cecostomy due to colonic stricture and megacolon  2/04 - Total abdominal colectomy and end ileostomy for toxic megacolon  2/09- Thoracentesis  2/26-Cystoscpy w/ BRP  3/03-EL/Liver bx/appliation wound VAC  3/06-EL/open abd wound VAC change  3/09-removal VAC dressing and abdominal closure  4/5 - PEG tube per IR   Antibiotics: PCN, Imipenem ALLERGIC - rash  S/p full treatment x 3 weeks for cdiff  Aztreonam 3/29>>> 3/31  vanc 3/29>>> 3/31   HPI/Subjective: Patient afebrile; overall feeling better and  tolerating advance of TF's up to 75cc/hr (goal); no nausea, no vomiting or abdominal pain. Patient less tachycardic and more alert today. reported having a good night.  Objective: Filed Vitals:   05/05/12 1521 05/05/12 2154 05/06/12 0556 05/06/12 1501  BP: 120/69 120/74 112/69 115/71  Pulse: 128 120 119 117  Temp: 98.8 F (37.1 C) 99.1 F (37.3 C) 98.2 F (36.8 C) 98.7 F (37.1 C)  TempSrc: Oral Oral Oral Oral  Resp: 18 18 18 18   Height:      Weight:   65.1 kg (143 lb 8.3 oz)   SpO2: 99% 100% 100% 100%    Intake/Output Summary (Last 24 hours) at 05/06/12 1504 Last data filed at 05/06/12 0350  Gross per 24 hour  Intake      0 ml  Output   1300 ml  Net  -1300 ml   Filed Weights   05/01/12 0543 05/02/12 0557 05/06/12 0556  Weight: 75.8 kg (167 lb 1.7 oz) 68.6 kg (151 lb 3.8 oz) 65.1 kg (143 lb 8.3 oz)    Exam:   General: AAOX3; no acute complaints; feeling better and stronger.  Cardiovascular: S 1, S 2 RRR  Respiratory: improved air movement, no wheezes; scattered rhonchi.   Abdomen: peg tube in place, ileostomy stoma stable, wound VAC over midline abdominal incision; clean dressings and wound in healing process. Mild distension, no significant tenderness. Positive BS  Musculoskeletal: no edema. Decrease range of motion by commands on his right side; also with spasticity on his RLE.  Data Reviewed: Basic Metabolic Panel:  Recent Labs Lab 04/30/12 0524 05/01/12 0600 05/01/12 2329 05/02/12 0415 05/04/12 0600  NA 147* 145  --  141 135  K 3.6 4.1  --  4.3 4.2  CL 117* 115*  --  111 104  CO2 19 22  --  22 23  GLUCOSE 121* 128*  --  115* 126*  BUN 21 19  --  18 16  CREATININE 0.96 0.88  --  0.86 0.84  CALCIUM 8.1* 7.9*  --  7.9* 8.1*  MG  --   --  1.5  --   --   PHOS  --   --  2.3  --   --    Liver Function Tests:  Recent Labs Lab 04/30/12 0524  AST 15  ALT 12  ALKPHOS 219*  BILITOT 0.9  PROT 4.9*  ALBUMIN 1.2*   CBC:  Recent Labs Lab  04/30/12 0524 05/01/12 0600 05/02/12 0415 05/03/12 0520 05/06/12 0500  WBC 21.9* 18.5* 18.1* 16.5* 19.9*  HGB 7.5* 7.0* 7.2* 7.3* 7.0*  HCT 23.1* 21.6* 22.1* 22.8* 21.5*  MCV 93.1 93.1 93.2 92.3 90.3  PLT 345 345 376 389 520*   BNP (last 3 results)  Recent Labs  04/10/12 0650  PROBNP 239.6*   CBG:  Recent Labs Lab 05/05/12 1252 05/05/12 1740 05/05/12 2148 05/06/12 0759 05/06/12 1220  GLUCAP 89 84 127* 109* 95    Recent Results (from the past 240 hour(s))  URINE CULTURE     Status: None   Collection Time    05/01/12  5:30 PM      Result Value Range Status   Specimen Description URINE, CLEAN CATCH   Final   Special Requests NONE  Final   Culture  Setup Time 05/01/2012 18:41   Final   Colony Count NO GROWTH   Final   Culture NO GROWTH   Final   Report Status 05/02/2012 FINAL   Final  CLOSTRIDIUM DIFFICILE BY PCR     Status: None   Collection Time    05/03/12  5:10 AM      Result Value Range Status   C difficile by pcr NEGATIVE  NEGATIVE Final     Studies: No results found.  Scheduled Meds: . aspirin  81 mg Per Tube Daily  . chlorhexidine  15 mL Mouth Rinse BID  . darbepoetin (ARANESP) injection - NON-DIALYSIS  40 mcg Subcutaneous Q Tue-1800  . feeding supplement (VITAL AF 1.2 CAL)  1,000 mL Per Tube Q24H  . ferrous sulfate  300 mg Per Tube TID WC  . free water  200 mL Per Tube Q3H  . insulin aspart  0-5 Units Subcutaneous QHS  . insulin aspart  0-9 Units Subcutaneous TID WC  . lidocaine  1 patch Transdermal Q24H  . lip balm   Topical BID  . loperamide  2 mg Per Tube QHS  . metoCLOPramide  5 mg Oral TID  . metoprolol succinate  12.5 mg Oral Daily  . multivitamin  5 mL Per Tube Daily  . pantoprazole sodium  40 mg Per Tube Q1200  . potassium chloride  20 mEq Oral BID  . 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    [facility]  [facility] Pager [phone]. If 7PM-7AM, please contact night-coverage at [email or web address], password [facility] 05/06/2012, 3:04 PM  LOS: 11 days

## 2012-05-07 LAB — GLUCOSE, CAPILLARY
Glucose-Capillary: 113 mg/dL — ABNORMAL HIGH (ref 70–99)
Glucose-Capillary: 102 mg/dL — ABNORMAL HIGH (ref 70–99)

## 2012-05-07 LAB — RETICULOCYTES
RBC.: 2.19 MIL/uL — ABNORMAL LOW (ref 4.22–5.81)
Retic Ct Pct: 2 % (ref 0.4–3.1)

## 2012-05-07 LAB — BASIC METABOLIC PANEL
BUN: 18 mg/dL (ref 6–23)
CO2: 25 mEq/L (ref 19–32)
Chloride: 102 mEq/L (ref 96–112)
Creatinine, Ser: 0.73 mg/dL (ref 0.50–1.35)

## 2012-05-07 LAB — PREPARE RBC (CROSSMATCH)

## 2012-05-07 MED ORDER — FUROSEMIDE 10 MG/ML IJ SOLN
40.0000 mg | Freq: Once | INTRAMUSCULAR | Status: AC
  Administered 2012-05-07: 40 mg via INTRAVENOUS
  Filled 2012-05-07: qty 4

## 2012-05-07 MED ORDER — METOPROLOL TARTRATE 25 MG/10 ML ORAL SUSPENSION
6.2500 mg | Freq: Two times a day (BID) | ORAL | Status: DC
  Administered 2012-05-07: 6.25 mg
  Filled 2012-05-07 (×4): qty 2.5

## 2012-05-07 NOTE — Clinical Social Work Note (Signed)
Clinical Social Worker met with patient's wife and provided list of responses from SNF. Per wife, she is looking into [facility] and [facility]. Wife is also looking into possibility of taking patient home with home health.  CSW will continue to follow.   Leandro Reasoner MSW, [place]

## 2012-05-07 NOTE — Progress Notes (Signed)
Speech Language Pathology Dysphagia Treatment Patient Details Name: Connor Small MRN: [id number] DOB: 02/10/1960 Today's Date: 05/07/2012 Time: 1545-1600 SLP Time Calculation (min): 15 min  Assessment / Plan / Recommendation Clinical Impression  Patient was easily aroused; sustained arousal for session; demonstrated limited initiation.  Patient initially declined p.o. trials; however, wife present for session and encouraged patient to participate.  Patient consumed thin liuqids via straw with min assist clinician cues to utilize har effortful swalows and utilize intermittent throat clears.  SLP also facilitated session with mod faded to min assist verbal and visual cues to perform diaphragmatic breahting exercises which elicited hard effortful coughs.  Recommended wife assist with exercises to help with breath support; she verbalized understanding.      Diet Recommendation  Continue with Current Diet: Regular;Thin liquid    SLP Plan Continue with current plan of care   Pertinent Vitals/Pain none   Swallowing Goals  SLP Swallowing Goals Swallow Study Goal #2 - Progress: Progressing toward goal Swallow Study Goal #3 - Progress: Progressing toward goal  General Temperature Spikes Noted: No Respiratory Status: Room air Behavior/Cognition: Cooperative;Pleasant mood;Requires cueing Oral Cavity - Dentition: Adequate natural dentition Patient Positioning: Upright in bed  Oral Cavity - Oral Hygiene Does patient have any of the following "at risk" factors?: Lips - dry, cracked Brush patient's teeth BID with toothbrush (using toothpaste with fluoride): Yes Patient is AT RISK - Oral Care Protocol followed (see row info): Yes   Dysphagia Treatment Treatment focused on: Skilled observation of diet tolerance;Other (comment) (diaphragmatic breathing exercises) Family/Caregiver Educated: wife Treatment Methods/Modalities: Skilled observation;Effortful swallow Patient observed directly with PO's:  Yes Type of PO's observed: Thin liquids Reason PO's not observed: Lethargic;Refused Feeding: Able to feed self;Needs set up Liquids provided via: Straw Pharyngeal Phase Signs & Symptoms: Multiple swallows;Other (comment) (cues for intermittent throat clear) Type of cueing: Verbal;Visual Amount of cueing: Minimal   GO     Carmelia Roller., CCC-SLP [phone]  [place] 05/07/2012, 4:41 PM

## 2012-05-07 NOTE — Progress Notes (Signed)
Physical Therapy Treatment Patient Details Name: Connor Small MRN: [id number] DOB: April 29, 1960 Today's Date: 05/07/2012 Time: 4098-1191 PT Time Calculation (min): 47 min  PT Assessment / Plan / Recommendation Comments on Treatment Session  Slow to progress today but very limited by low hgb 6.5. Family very interested in him going back to [facility] and have flat out refused [facility] for LTAC. Education provided to wife and daughter on leg exercises he can be doing with them. They are advocating for him to get OOB to chair daily and doing upper extremity exercises with him. Will see him tomorrow following blood transfusion to see if he can progress with therapies. Will bring HEP for lower extremities next visit.     Follow Up Recommendations  [facility] vs SNF      Does the patient have the potential to tolerate intense rehabilitation     Barriers to Discharge        Equipment Recommendations  If [facility] doesn't accept him and family refuses SNF he will need: Hospital bed; Wheelchair;Research scientist (medical) for [facility]    Frequency Min 3X/week   Plan Discharge plan needs to be updated;Frequency remains appropriate    Precautions / Restrictions Precautions Precautions: Fall;Shoulder Type of Shoulder Precautions: R shoulder subluxation Precaution Comments:  ostomy, tachycardia, Afib, Wound vac, peg tube (doesn't wear abd binder anymore) Required Braces or Orthoses: Other Brace/Splint Other Brace/Splint: R wrist cock up slint Restrictions Other Position/Activity Restrictions: support R UE   Pertinent Vitals/Pain Did complain of pain in his right knee when flexed too far alleviated by extending his knee    Mobility  Bed Mobility Rolling Right: 3: Mod assist;With rail Rolling Left: 3: Mod assist Left Sidelying to Sit: Not tested (comment) Details for Bed Mobility Assistance: rolling bilaterally x2 with mod facilitation to flex knees in prep for rolling as well as  facilitation to upper trunk to initiate and reach with LUE for rail Transfers Transfers: Not assessed Transfer via Lift Equipment: Maximove Details for Transfer Assistance: hbg 6.5 today, will hold attempting transfers so utilized maximove to transfer patient from chair to bed    Exercises General Exercises - Lower Extremity Ankle Circles/Pumps: AAROM;Both;10 reps;Seated Short Arc Quad: AAROM;Both;5 reps;Supine Long Arc Quad: AAROM;Both;5 reps;Seated Heel Slides: AAROM;Both;5 reps;Supine   PT Goals Acute Rehab PT Goals PT Goal: Sit at Edge Of Bed - Progress: Progressing toward goal Pt will Perform Home Exercise Program: Independently (Family will perform with patient at minA level 2x/day 7 days) PT Goal: Perform Home Exercise Program - Progress: Updated due to goal met  Visit Information  Last PT Received On: 05/07/12 Assistance Needed: +2    Subjective Data  Subjective: Im tired.    Cognition  Cognition Overall Cognitive Status: Impaired Difficult to assess due to: Impaired communication Arousal/Alertness: Awake/alert Orientation Level: Disoriented to;Time;Place Behavior During Session: Connor Small for tasks performed Current Attention Level: Selective Following Commands: Follows one step commands consistently;Follows one step commands with increased time Cognition - Other Comments: slow processing, slow to respond    Balance  Static Sitting Balance Static Sitting - Balance Support: Bilateral upper extremity supported Static Sitting - Level of Assistance: 4: Min assist Static Sitting - Comment/# of Minutes: sitting edge of chair with focus on trunk/core engagement for tall posture and putting weight through his feet; sat tall 1 minute x3; facilitation for anterior pelvic tilt (pt complaining of his back feeling tight so performed gentle active assist spinal mobs for back extension); noted good initiation  and engagement today however fatigues quickly due to hgb 6.5  End of Session PT  - End of Session Activity Tolerance: Patient limited by fatigue;Treatment limited secondary to medical complications (Comment) (low hgb) Patient left: in bed;with call bell/phone within reach;with family/visitor present Nurse Communication: Mobility status;Need for lift equipment   GP     Connor Small Connor Small 05/07/2012, 2:48 PM

## 2012-05-07 NOTE — Progress Notes (Signed)
[facility] PROGRESS NOTE  Connor Small YNW:[id number] DOB: [birth year] DOA: 04/25/2012 PCP: No primary provider on file.   1-Pleural effusions L > R w/ HCAP vs Aspiration pneumonitis  -anbx's dc/d 4/2 since completed 5 days of tx  -suspect severe Protein calorie malnutrition contributing to fluid accumulations including pleural effusions  -Afebrile, no further hypoxemia.  -no antibiotics indicated at this point -continue IS  2-Acute renal failure on CKD stage 2, GFR 60-89 ml/min - resolved  -maintained good UOP - GFR now normal  -will monitor  3-Anemia of chronic disease & of critical illness  -hgb stable although baseline around 9.5  -hemodynamically stable.  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 But as requested by family will ask CIR to reevaluate on Monday for possible acceptance instead of SNF.  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  -continue per tube ASA per previous Neuro recs  -once tolerates enteral feeds/PO intake will need to consider full oral anticoag if pt is observed to return to afib   7-Hypokalemia  -repleted   8-PAF (paroxysmal atrial fibrillation)  -resolved; and has remained on SR -likely more sporadic isolated event as opposed to true paroxysmal since only occurred when septic  -did have embolic CVA but unable to definitely clarify if etiology solely septic vs thrombo emobilic so will need to consider chronic  anti-coagulation -anticoagulation on hold for now due to low hgb and high risk for bleeding at this moment.  -continue ASA -started on b-blocker low dose to help controlling tachycardia (sinus)  9-Protein-calorie malnutrition, severe / Dehydration -cont imodium to decrease ileo output as needed -Malnutrition major factor contributing to slow recovery/wound healing and 3rd space shifting due to low albumin  -now s/p PEG tube per IR - at goal rate 75/hr  -tolerating tube feeding.  -will continue reglan TID and PRN compazine for nausea  10-Spleen hematoma - subcapsular & without rupture / Intra-abdominal fluid collection/LUQ  -CCS: nothing to do for spleen/self limiting  -continue monitoring -no signs of acute bleeding or abdominal pain.  11-Recent hyperglycemia  -cont SSI  -due to tube feedings -CBG's in the 110's-120's range  12-Persistent sinus tachy  Likely multifactorial  -no CP or SOB -Will keep hydrated.   -TSH WNL -low probability on V/Q scan for PE  -will cont B-blocker; HR better and BP stable. -Hgb down to 6.5; which can contribute to his tachycardia. Will transfuse 2 units of PRBC's  13-Leukocytosis: WBC trending down. Most likely due to stress demargination and recent asp PNA.  -UA and urine cx WNL -neg C. Diff -continue holding for ay further abx's at this moment. -patient afebrile and otherwise slowly improving.   Code Status: Full Family Communication: Care discussed with wife who was at bedside.  Disposition Plan: SNF for further rehab. Due to issues with insurance company patient not a candidate to family's selected LTAC.   Consultants: CCS  Procedures: 2/03- Flex sig  2/03 - Decompressive Cecostomy due to colonic stricture and megacolon  2/04 -  Total abdominal colectomy and end ileostomy for toxic megacolon  2/09- Thoracentesis  2/26-Cystoscpy w/ BRP  3/03-EL/Liver bx/appliation wound VAC  3/06-EL/open abd wound VAC change  3/09-removal VAC dressing  and abdominal closure  4/5 - PEG tube per IR   Antibiotics: PCN, Imipenem ALLERGIC - rash  S/p full treatment x 3 weeks for cdiff  Aztreonam 3/29>>> 3/31  vanc 3/29>>> 3/31   HPI/Subjective: Patient afebrile; overall feeling better and tolerating advance of TF's up to 75cc/hr (goal); no nausea, no vomiting or abdominal pain. Patient sleeping better at night. This morning CBC demonstrated Hgb down to 6.5  Objective: Filed Vitals:   05/06/12 2053 05/07/12 0542 05/07/12 1358 05/07/12 1437  BP: 114/70 101/65 110/71 96/55  Pulse: 119 108 136 109  Temp: 98.6 F (37 C) 97.2 F (36.2 C)  99.4 F (37.4 C)  TempSrc: Oral Oral  Oral  Resp: 18 18  18   Height:      Weight:  66.2 kg (145 lb 15.1 oz)    SpO2: 100% 100%  100%    Intake/Output Summary (Last 24 hours) at 05/07/12 1446 Last data filed at 05/07/12 1300  Gross per 24 hour  Intake    120 ml  Output    750 ml  Net   -630 ml   Filed Weights   05/02/12 0557 05/06/12 0556 05/07/12 0542  Weight: 68.6 kg (151 lb 3.8 oz) 65.1 kg (143 lb 8.3 oz) 66.2 kg (145 lb 15.1 oz)    Exam:   General: AAOX3; no acute complaints; feeling better and stronger according to patient and family.  Cardiovascular: S 1, S 2 RRR  Respiratory: improved air movement, no wheezes; scattered rhonchi.   Abdomen: peg tube in place, ileostomy stoma stable, wound VAC over midline abdominal incision; clean dressings and wound in healing process. Mild distension, no significant tenderness. Positive BS  Musculoskeletal: no edema. Decrease range of motion by commands on his right side; also with spasticity on his RLE.  Data Reviewed: Basic Metabolic Panel:  Recent Labs Lab 05/01/12 0600 05/01/12 2329 05/02/12 0415 05/04/12 0600 05/07/12 0900  NA 145  --  141 135 135  K 4.1  --  4.3 4.2 4.3  CL 115*  --  111 104 102  CO2 22  --  22 23 25   GLUCOSE 128*  --  115* 126* 109*  BUN 19  --  18 16 18   CREATININE 0.88  --  0.86 0.84 0.73  CALCIUM 7.9*   --  7.9* 8.1* 8.5  MG  --  1.5  --   --   --   PHOS  --  2.3  --   --   --    CBC:  Recent Labs Lab 05/01/12 0600 05/02/12 0415 05/03/12 0520 05/06/12 0500 05/07/12 0900  WBC 18.5* 18.1* 16.5* 19.9*  --   HGB 7.0* 7.2* 7.3* 7.0* 6.5*  HCT 21.6* 22.1* 22.8* 21.5* 20.0*  MCV 93.1 93.2 92.3 90.3  --   PLT 345 376 389 520*  --    BNP (last 3 results)  Recent Labs  04/10/12 0650  PROBNP 239.6*   CBG:  Recent Labs Lab 05/06/12 1220 05/06/12 1717 05/06/12 2057 05/07/12 0749 05/07/12 1211  GLUCAP 95 93 115* 113* 104*    Recent Results (from the past 240 hour(s))  URINE CULTURE     Status: None   Collection Time    05/01/12  5:30 PM      Result Value Range Status  Specimen Description URINE, CLEAN CATCH   Final   Special Requests NONE   Final   Culture  Setup Time 05/01/2012 18:41   Final   Colony Count NO GROWTH   Final   Culture NO GROWTH   Final   Report Status 05/02/2012 FINAL   Final  CLOSTRIDIUM DIFFICILE BY PCR     Status: None   Collection Time    05/03/12  5:10 AM      Result Value Range Status   C difficile by pcr NEGATIVE  NEGATIVE Final     Studies: No results found.  Scheduled Meds: . aspirin  81 mg Per Tube Daily  . chlorhexidine  15 mL Mouth Rinse BID  . darbepoetin (ARANESP) injection - NON-DIALYSIS  40 mcg Subcutaneous Q Tue-1800  . feeding supplement (VITAL AF 1.2 CAL)  1,000 mL Per Tube Q24H  . ferrous sulfate  300 mg Per Tube TID WC  . free water  200 mL Per Tube Q3H  . furosemide  40 mg Intravenous Once  . insulin aspart  0-5 Units Subcutaneous QHS  . insulin aspart  0-9 Units Subcutaneous TID WC  . lidocaine  1 patch Transdermal Q24H  . lip balm   Topical BID  . loperamide  2 mg Per Tube QHS  . metoCLOPramide  5 mg Oral TID  . metoprolol tartrate  6.25 mg Per Tube BID  . multivitamin  5 mL Per Tube Daily  . pantoprazole sodium  40 mg Per Tube Q1200  . potassium chloride  20 mEq Oral BID  . 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    [facility]  [facility] Pager [phone]. If 7PM-7AM, please contact night-coverage at [email or web address], password [facility] 05/07/2012, 2:46 PM  LOS: 12 days

## 2012-05-07 NOTE — Progress Notes (Signed)
NUTRITION FOLLOW UP  Intervention:   1. Enteral nutrition; Continue Vital 1.2 @ 75 mL/hr which provides 2160 kcal, 135g protein, 1368 mL free water. Pt PO intake in the past has been negligible and inconsistent.  Please inform RD of any decisions r/t to holding or transitioning to a different regimen. Pt is receiving 200 mL free water boluses TID which provides 1968 mL free water daily.  2.  Nutrition-related medications; continue MVI 4. RD to continue to follow nutrition care plan and for pt's progress with SLP and d/c planning.   Nutrition Dx:   Inadequate oral intake, ongoing.   Goal:  Intake to meet >90% of estimated nutrition needs. Unmet,.  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  Pt with acute respiratory and renal failure with ARDS and septic shock. Pt recently discharged to CIR, however currently admitted as inpatient due to possible aspiration PNA (4/2).  Hospital course nutrition summary:  2/2 - 2/20: permissively underfed with EN  2/21-2/26: advanced to full feeds  2/26 - 3/3: feeds held for surgery  3/4 - 3/11: resumed full feeds  3/11 - 3/12: feeds held for GI bleed  3/13 - 3/24: received full feeds  3/24: TFs discontinued diet initiated; pt with very poor oral intake; tx to rehab 3/25  3/28 - 4/1: increased n/v - pt refusing almost all meals and supplements; wife declined calorie count, requesting TPN  4/2: NGT placed to suction, surgery ordering TF initiation; tx back to acute with plans for trickle feedings. 4/3-4/4:  Trickle feeds of Vital 1.2 @ 30 mL/hr 4/5: PEG placement, resume of Vital 1.2 4/6-4/9:  Inconsistent TF infusion and frequent adjustment of rate.  Pt has not  yet reached >40 mL/hr with tolerance.  Wt now 151 lbs. 4/10-11:  Pt has advanced as scheduled to 60 mL/hr.  Discussed RN.  Plan to increase to 70 mL this afternoon. 4/14:  Pt has reached goal TF rate of 75 mL/hr with tolerance.  PO intake remains insufficient with improvement per pt's report.  Residual:  50 mL at last check.    Pt continues to meet criteria for severe malnutrition of acute illness given 29% wt loss in >1 month and PO/nutrition support meeting </=75% of estimated needs for >1 month.  Pt has persistently struggled with nausea since admission which contributed to inability to tolerate TFs. Pt has NOT complained of nausea since starting trickle feeds with slow advancements.  If POs resumed and pt c/o nausea, consider limiting POs to small boluses and continuing TFs at last known tolerated rate of 50 mL/hr.    RD strongly recommends ongoing nutrition support for pt with continued PO intake at meals and participation in [place] for swallowing improvement and long-term goal of resuming full PO feeds.  Pt still has not yet reached goal volume of continuous TFs and is not likely capable of consuming adequate nutrition with PO alone at this time due to lethargy, deconditioning, poor appetite, and early satiety.  Would recommend making adjustments to TF based on PO performance with SLP.  Note pt's hesitancy with PO intake and limited acceptance. Pt reports struggling with poor taste.    Height: Ht Readings from Last 1 Encounters:  04/25/12 5' 9"  (1.753 m)  Weight Status:   Wt Readings from Last 1 Encounters:  05/07/12 145 lb 15.1 oz (66.2 kg)    Re-estimated needs:  Kcal: 2200-2400 Protein: 120-140g Fluid: >2.2 L/day  Skin: incisions, ileostomy, otherwise intact  Diet Order: General   Intake/Output Summary (Last 24 hours) at 05/07/12 1520 Last data filed at 05/07/12 1300  Gross per 24 hour  Intake    120 ml  Output    750 ml  Net   -630 ml    Last BM:  4/9   Labs:   Recent Labs Lab 05/01/12 2329 05/02/12 0415 05/04/12 0600 05/07/12 0900  NA  --  141 135 135  K  --  4.3 4.2 4.3  CL  --  111 104 102  CO2  --  22 23 25   BUN  --  18 16 18   CREATININE  --  0.86 0.84 0.73  CALCIUM  --  7.9* 8.1* 8.5  MG 1.5  --   --   --   PHOS 2.3  --   --   --   GLUCOSE  --  115* 126* 109*    CBG (last 3)   Recent Labs  05/06/12 2057 05/07/12 0749 05/07/12 1211  GLUCAP 115* 113* 104*    Scheduled Meds: . aspirin  81 mg Per Tube Daily  . chlorhexidine  15 mL Mouth Rinse BID  . darbepoetin (ARANESP) injection - NON-DIALYSIS  40 mcg Subcutaneous Q Tue-1800  . feeding supplement (VITAL AF 1.2 CAL)  1,000 mL Per Tube Q24H  . ferrous sulfate  300 mg Per Tube TID WC  . free water  200 mL Per Tube Q3H  . furosemide  40 mg Intravenous Once  . insulin aspart  0-5 Units Subcutaneous QHS  . insulin aspart  0-9 Units Subcutaneous TID WC  . lidocaine  1 patch Transdermal Q24H  . lip balm   Topical BID  . loperamide  2 mg Per Tube QHS  . metoCLOPramide  5 mg Oral TID  . metoprolol tartrate  6.25 mg Per Tube BID  . multivitamin  5 mL Per Tube Daily  . pantoprazole sodium  40 mg Per Tube Q1200  . potassium chloride  20 mEq Oral BID  . saccharomyces boulardii  250 mg Oral BID    Continuous Infusions: none   Brynda Greathouse, MS RD LDN Clinical Inpatient Dietitian Pager: [phone] Weekend/After hours pager: [phone]

## 2012-05-07 NOTE — Progress Notes (Signed)
I met with pt, spouse, and daughter at bedside.I discussed that therapy progress over last two sessions do not support an inpt rehab admission. As per Dr. Naaman Plummer recommendations on 4/9 is for [facility] admission to give patient more time to improve his activity tolerance over several weeks. Wife and pt refuse [facility] and want to be reconsidered for CIR admission. Wife states pt to be transfused today. I will assess pt after next therapy session as well as discuss with Dr. Letta Pate for Dr. Naaman Plummer is out of town. Please call me for any questions. [phone]

## 2012-05-07 NOTE — Progress Notes (Signed)
  Subjective: He says he's eating better.  No complaints.  Objective: Vital signs in last 24 hours: Temp:  [97.2 F (36.2 C)-98.7 F (37.1 C)] 97.2 F (36.2 C) (04/14 0542) Pulse Rate:  [108-119] 108 (04/14 0542) Resp:  [18] 18 (04/14 0542) BP: (101-115)/(65-71) 101/65 mmHg (04/14 0542) SpO2:  [100 %] 100 % (04/14 0542) Weight:  [145 lb 15.1 oz (66.2 kg)] 145 lb 15.1 oz (66.2 kg) (04/14 0542) Last BM Date: 05/06/12  800 from ileostomy recorded. Afebrile. VSS. WBC is still elevated Regular diet, TF at 75 ml per hours. Intake/Output from previous day: 04/13 0701 - 04/14 0700 In: -  Out: 950 [Urine:500; Stool:450] Intake/Output this shift:    General appearance: alert, cooperative and no distress GI: soft, non-tender; bowel sounds normal; no masses,  no organomegaly and wound looks good, ileostomy working TF at 75 ml per hour.  Lab Results:   Recent Labs  05/06/12 0500  WBC 19.9*  HGB 7.0*  HCT 21.5*  PLT 520*    BMET No results found for this basename: NA, K, CL, CO2, GLUCOSE, BUN, CREATININE, CALCIUM,  in the last 72 hours PT/INR No results found for this basename: LABPROT, INR,  in the last 72 hours  No results found for this basename: AST, ALT, ALKPHOS, BILITOT, PROT, ALBUMIN,  in the last 168 hours   Lipase     Component Value Date/Time   LIPASE 34 04/02/2012 0424     Studies/Results: No results found.  Medications: . aspirin  81 mg Per Tube Daily  . chlorhexidine  15 mL Mouth Rinse BID  . darbepoetin (ARANESP) injection - NON-DIALYSIS  40 mcg Subcutaneous Q Tue-1800  . feeding supplement (VITAL AF 1.2 CAL)  1,000 mL Per Tube Q24H  . ferrous sulfate  300 mg Per Tube TID WC  . free water  200 mL Per Tube Q3H  . insulin aspart  0-5 Units Subcutaneous QHS  . insulin aspart  0-9 Units Subcutaneous TID WC  . lidocaine  1 patch Transdermal Q24H  . lip balm   Topical BID  . loperamide  2 mg Per Tube QHS  . metoCLOPramide  5 mg Oral TID  . metoprolol  succinate  12.5 mg Oral Daily  . multivitamin  5 mL Per Tube Daily  . pantoprazole sodium  40 mg Per Tube Q1200  . potassium chloride  20 mEq Oral BID  . saccharomyces boulardii  250 mg Oral BID     Assessment/Plan S/p total abdominal colectomy with ileostomy, s/p multiple takebacks for abdominal closure s/p evisceration. 5 abdominal surgeries prior to admit here.  Dysphagia, TF  functional quadriparesis  Anemia  ARF  CVA's  PAF  PCM/DECONDITIONING  Leukocytosis/Pleura effusions with ongoing pulmonary issues   Plan:  Continue current Rx.  Doing well from surgeries, wound dressing changed and looks good.  LOS: 12 days    Connor Small 05/07/2012

## 2012-05-07 NOTE — Significant Event (Signed)
CRITICAL VALUE ALERT  Critical value received:  Hemoglobin 6.5   Date of notification:  05/07/2012  Time of notification:  4884  Critical value read back:yes  Nurse who received alert:  Esperanza Richters, RN  MD notified (1st page):  Dr. Dyann Kief   Time of first page:  1005-Spoke with MD in patient room  MD notified (2nd page):  Time of second page:  Responding MD:  Dr. Dyann Kief   Time MD responded:  1005

## 2012-05-08 DIAGNOSIS — R5381 Other malaise: Secondary | ICD-10-CM

## 2012-05-08 DIAGNOSIS — G7281 Critical illness myopathy: Secondary | ICD-10-CM

## 2012-05-08 LAB — CBC
HCT: 27.5 % — ABNORMAL LOW (ref 39.0–52.0)
MCH: 29.9 pg (ref 26.0–34.0)
MCV: 86.5 fL (ref 78.0–100.0)
Platelets: 404 10*3/uL — ABNORMAL HIGH (ref 150–400)
RBC: 3.18 MIL/uL — ABNORMAL LOW (ref 4.22–5.81)

## 2012-05-08 LAB — BASIC METABOLIC PANEL
BUN: 20 mg/dL (ref 6–23)
CO2: 25 mEq/L (ref 19–32)
Calcium: 8.4 mg/dL (ref 8.4–10.5)
Chloride: 99 mEq/L (ref 96–112)
Creatinine, Ser: 0.78 mg/dL (ref 0.50–1.35)

## 2012-05-08 LAB — GLUCOSE, CAPILLARY
Glucose-Capillary: 119 mg/dL — ABNORMAL HIGH (ref 70–99)
Glucose-Capillary: 136 mg/dL — ABNORMAL HIGH (ref 70–99)
Glucose-Capillary: 97 mg/dL (ref 70–99)
Glucose-Capillary: 104 mg/dL — ABNORMAL HIGH (ref 70–99)

## 2012-05-08 LAB — TYPE AND SCREEN
ABO/RH(D): O NEG
Antibody Screen: NEGATIVE
Unit division: 0

## 2012-05-08 MED ORDER — METOPROLOL TARTRATE 25 MG/10 ML ORAL SUSPENSION
6.2500 mg | Freq: Every day | ORAL | Status: DC
  Administered 2012-05-08 – 2012-05-09 (×2): 6.25 mg
  Filled 2012-05-08 (×2): qty 2.5

## 2012-05-08 MED ORDER — VITAL AF 1.2 CAL PO LIQD
1000.0000 mL | ORAL | Status: AC
  Administered 2012-05-08: 1000 mL
  Filled 2012-05-08 (×2): qty 1000

## 2012-05-08 NOTE — Consult Note (Signed)
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 with barrier ring to maintain seal Education provided: Wife is able to remove and apply new pouch and barrier ring independently. She has been emptying and closing velcro without assistance.  Denies further questions or need for assistance. Supplies at bedside for staff use.   Please re-consult if further assistance is needed.  Thank-you,  Julien Girt MSN, [place], [place], [place], [place]

## 2012-05-08 NOTE — Progress Notes (Signed)
Occupational Therapy Treatment Patient Details Name: SAMBO BASSANO MRN: [id number] DOB: [birth year] Today's Date: 05/08/2012 Time: 4332-9518 OT Time Calculation (min): 39 min  OT Assessment / Plan / Recommendation Comments on Treatment Session Patient making progress with OT. Wife voiced concern regarding d/c plan and is not happy with SNF bed offers. Wants CIR vs. home with home health. Per wife, MD is facilitating reevaluation by CIR. Depending on what CIR decides, may need to change goals to family education on ADLs/ADL equipment to facilitate d/c home. Will continue to follow.    Follow Up Recommendations  CIR;Home health OT (pt's wife refusing SNF bed offers currently)    Barriers to Discharge       Equipment Recommendations  3 in 1 bedside comode;Hospital bed    Recommendations for Other Services Rehab consult  Frequency Min 2X/week   Plan Discharge plan needs to be updated    Precautions / Restrictions Precautions Precautions: Fall;Shoulder Type of Shoulder Precautions: R shoulder subluxation Precaution Comments:  ostomy, tachycardia, Afib, Wound vac, peg tube (doesn't wear abd binder anymore) Required Braces or Orthoses: Other Brace/Splint Other Brace/Splint: R wrist cock up slint Restrictions Weight Bearing Restrictions: No Other Position/Activity Restrictions: support R UE   Pertinent Vitals/Pain     ADL  Upper Body Dressing: Simulated;+1 Total assistance Where Assessed - Upper Body Dressing: Supported sitting (don RUE splint) Toilet Transfer: +2 Total assistance;Simulated Toilet Transfer: Patient Percentage: 10% Toilet Transfer Method: Other (comment) (sit to partial stand) Acupuncturist: Other (comment) Antony Salmon) Equipment Used: Other (comment) (Stedy) Transfers/Ambulation Related to ADLs: Attempted sit to partial stand for WB and trunk balance in Stedy in preparation for toilet transfers and EOB sitting ADL Comments: Wife and daughter present during  session. Report that patient is doing "better physically." Patient c/o pain/tightness in legs during WB activity. Patient also c/o soreness in RUE with movement/ROM. Wife reports she is doing exercises RUE and patient tolerated well. Patient educated in neck extension while seated in chair.      OT Goals ADL Goals ADL Goal: Additional Goal #2 - Progress: Progressing toward goals Arm Goals Arm Goal: Additional Goal #1 - Progress: Progressing toward goals Miscellaneous OT Goals OT Goal: Miscellaneous Goal #3 - Progress: Progressing toward goals  Visit Information  Last OT Received On: 05/08/12 Assistance Needed: +2 PT/OT Co-Evaluation/Treatment: Yes    Cognition  Cognition Overall Cognitive Status: Impaired Difficult to assess due to: Impaired communication Arousal/Alertness: Awake/alert Orientation Level: Disoriented to;Time;Place Behavior During Session: [facility] for tasks performed Current Attention Level: Selective Following Commands: Follows one step commands consistently;Follows one step commands with increased time Cognition - Other Comments: slow processing, slow to respond    Mobility  Transfers Sit to Stand: 1: +2 Total assist;From chair/3-in-1;Other (comment) (using Stedy) Sit to Stand: Patient Percentage: 10% Transfer via Lift Equipment: Stedy Details for Transfer Assistance: Use of pad to assist with hip and trunk extension.           Balance Static Sitting Balance Static Sitting - Balance Support: Bilateral upper extremity supported Static Sitting - Level of Assistance: 4: Min assist Static Sitting - Comment/# of Minutes: supported sitting on Stedy with focus on trunk engagement, bearing weight through feet, neck extension. Tolerated about 3 minutes in supported sit in [employer] of Session OT - End of Session Equipment Utilized During Treatment:  Antony Salmon) Activity Tolerance: Patient limited by fatigue;Patient limited by pain Patient left: in chair;with call  bell/phone within reach;with family/visitor present Nurse Communication: Patient requests  pain meds  GO     Deyci Gesell A 05/08/2012, 12:50 PM

## 2012-05-08 NOTE — Progress Notes (Addendum)
Physical Therapy Treatment Patient Details Name: Connor Small: [id number] DOB: Dec 13, 1960 Today's Date: 05/08/2012 Time: 4132-4401 PT Time Calculation (min): 66 min  PT Assessment / Plan / Recommendation Comments on Treatment Session  Making progress today participating in partial standing/sitting activities using stedy lift equipment and focus on core/truncal engagement. Long discussion with patient about his need to really engage in PT sessions and push himself now. He appears very motivated as well as his wife. Wife has expressed concern with SNF options and is insisting that she will take him home if he does not get accepted to CIR.  I do feel a CIR stay prior to d/c home would be most benefial to maximize his strength and family's ability to manage him at home. If patient does go home we will need to focus our efforts on education for home management.     Follow Up Recommendations  CIR vs HHPT with 24 hour assist     Does the patient have the potential to tolerate intense rehabilitation     Barriers to Discharge        Equipment Recommendations  Hospital bed;Wheelchair (measurements PT);Wheelchair cushion (measurements PT) (hoyer lift)    Recommendations for Other Services Rehab consult  Frequency Min 5X/week   Plan Discharge plan remains appropriate;Frequency needs to be updated    Precautions / Restrictions Precautions Precautions: Fall;Shoulder Type of Shoulder Precautions: R shoulder subluxation Precaution Comments:  ostomy, tachycardia, Afib, Wound vac, peg tube (doesn't wear abd binder anymore) Required Braces or Orthoses: Other Brace/Splint Other Brace/Splint: R wrist cock up slint Restrictions Weight Bearing Restrictions: No Other Position/Activity Restrictions: support R UE   Pertinent Vitals/Pain Reports pain in standing activities mostly with stretching of his ankle (tight achilles R>L), RN made aware and meds provided    Mobility  Bed Mobility Bed  Mobility: Not assessed Transfers Transfers: Sit to Stand;Stand to Sit Sit to Stand: 1: +2 Total assist;From chair/3-in-1;From elevated surface;With upper extremity assist Sit to Stand: Patient Percentage: 10% Stand to Sit: 1: +2 Total assist;To chair/3-in-1;With upper extremity assist;To elevated surface Stand to Sit: Patient Percentage: 10% Transfer via Lift Equipment: Stedy Details for Transfer Assistance: Use of pad to assist with hip and trunk extension, heavy facilitation to bring trunk anteriorly over BOS and prevent hyperextension through knees; max cueing for participation; utilized stedy for sit<>stand x3 (first attempt could not clear buttocks to close stedy)    Exercises General Exercises - Lower Extremity Ankle Circles/Pumps: AAROM;Both;10 reps;Seated;Limitations Ankle Circles/Pumps Limitations: wife performing exercises with supervision Gluteal Sets: AAROM;Both;5 reps;Seated;Limitations Gluteal Sets Limitations: wife performing exercises with supervision for technique Short Arc Quad: AAROM;Left;5 reps;Seated;Limitations Short Arc Quad Limitations: with legs elevated in recliner Hip ABduction/ADduction: AAROM;Both;5 reps;Seated;Limitations Hip Abduction/Adduction Limitations: with legs elevated in recliner Other Exercises Other Exercises: instructed patient and wife on head press into chair/pillow when seated in chair Other Exercises: passive DF stretch bilaterally (wife performed with cueing for technique with 30 second holds)     PT Goals Acute Rehab PT Goals Pt will Sit at [facility] of Bed: with supervision;with unilateral upper extremity support;6-10 min PT Goal: Sit at Edge Of Bed - Progress: Progressing toward goal PT Goal: Sit to Stand - Progress: Progressing toward goal PT Transfer Goal: Bed to Chair/Chair to Bed - Progress: Progressing toward goal PT Goal: Perform Home Exercise Program - Progress: Progressing toward goal  Visit Information  Last PT Received On:  05/08/12 Assistance Needed: +2    Subjective Data  Subjective: I feel better today.  Patient Stated Goal: to walk again   Cognition  Cognition Overall Cognitive Status: Impaired Difficult to assess due to: Impaired communication Arousal/Alertness: Awake/alert Orientation Level: Disoriented to;Time;Place Behavior During Session: [facility] for tasks performed Current Attention Level: Selective Following Commands: Follows one step commands consistently;Follows one step commands with increased time Problem Solving: Mod A Cognition - Other Comments: slow processing, slow to respond likely related to fatigue factor    Balance  Static Sitting Balance Static Sitting - Balance Support: Bilateral upper extremity supported;Right upper extremity supported Static Sitting - Level of Assistance: 4: Min assist Static Sitting - Comment/# of Minutes: sitting edge of chair x3 with facilitation and cueing for trunk engagement and tall posture; supported sitting on stedy with focus on trunk engagement and extension, active bearing weight through feet and lower extremities (stretching achilles tendon through active weight bearing), and neck extension; tolerated 3 minutes in supported sit in stedy  End of Session PT - End of Session Equipment Utilized During Treatment: Gait belt Activity Tolerance: Patient tolerated treatment well;Patient limited by pain;Patient limited by fatigue Patient left: in chair;with call bell/phone within reach Nurse Communication: Mobility status   GP     [facility] HELEN 05/08/2012, 2:01 PM

## 2012-05-08 NOTE — Progress Notes (Signed)
[facility] PROGRESS NOTE  PURVIS CROSWELL NUU:[id number] DOB: 1960-11-24 DOA: 04/25/2012 PCP: No primary provider on file.  Brief summary 52 year old male patient with past medical history of ulcerative colitis and prior diverticular perforation. He was admitted to [facility] on 02/26/2012 with C. difficile colitis. He initially underwent exploratory laparotomy for a cecostomy tube placement by Dr. Dian Situ on 02/27/2012 because of colonic stricture. 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 Because of septic shock with acute renal failure and Atrial fib with RVR he was transferred to [facility] 03/01/2012. He required acute dialysis/CVVHD and was followed by pulmonary critical care medicine for his sepsis and shock that required pressors. CCS was consulted in regards to the surgical problems. He developed wound evisceration and underwent multiple procedures with eventual closure and placement of retention sutures on 04/01/2012 by Dr. Johna Sheriff. 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  There was also question of heparin induced thrombocytopenia. He was transitioned to Argatroban and developed a GI bleed on 04/02/2012. He had persistent lower extremity edema but dopplers were negative for DVT.  Because of diffuse weakness, especially right upper extremity weakness, neurology was consulted. 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 Dr. Pearlean Brownie recommended aspirin initially with transition to full anticoagulation once thrombocytopenia and transaminitis resolved. Because of patient's difficulty swallowing and tolerating enteral feeds initiation of oral medications including anticoagulation has been limited. His  feeding tube was initially discontinued on 04/16/2012 and his diet was advanced to dysphagia 3. 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 PT and OT recommended patient proceed with inpatient rehabilitation and he was admitted to that unit on 04/17/2012.  After admission to the rehabilitation unit 04/21/2012 the patient developed nausea & vomiting and chest x-ray revealed a right lower lobe pneumonia likely related to aspiration. He also had associated leukocytosis. Critical care medicine was reconsulted and they recommended beginning vancomycin + Azactam for possible healthcare associated pneumonia. Unfortunately despite attempts to allow for oral intake he has had significant anorexia and poor intake. Surgery has continued to follow patient. He has a combination of anemia of critical illness as well as acute blood loss anemia and has received multiple packed red blood cells this admission; last one on 05/07/12 with most recent hemoglobin of >9.0.  Patient now tolerating TF's at goal 75cc/hr, no nausea, no vomiting and initiating to add PO intake as well. Feeling stronger and now facing final decision regarding CIR vs SNF vs Home with home health service at discharge.    1-Pleural effusions L > R w/ HCAP vs Aspiration pneumonitis  -abx's dc/d 4/2 since completed 5 days of tx  -suspect severe Protein calorie malnutrition contributing to fluid accumulations including pleural effusions  -Afebrile, no further hypoxemia.  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 -return to CIR to be considered by CIR service. -patient network availability with poor background. Family will take him home before placing him on those SNF's. -will need HH services (PT/OT/RN?aid and DME's if going home) -hopefully can go for intense recovery to CIR and then home. 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  7-Hypokalemia  -repleted   8-PAF (paroxysmal atrial fibrillation)  -resolved; and has remained on SR -likely more sporadic isolated event as opposed to true paroxysmal since only occurred when septic  -did have embolic CVA but unable to definitely clarify if etiology solely septic vs thrombo emobilic, so will need to consider chronic anti-coagulation -anticoagulation on hold for now due to low hgb and high risk for bleeding at this moment.  -continue ASA -started on b-blocker low dose to help controlling tachycardia (sinus)  9-Protein-calorie malnutrition, severe / Dehydration -cont imodium to decrease ileo output as needed -Malnutrition major factor contributing to slow recovery/wound healing and 3rd space shifting due to low albumin  -now s/p PEG tube per IR - at goal rate 75/hr  -tolerating tube feeding.  -will continue reglan TID and PRN compazine for nausea  10-Spleen hematoma - subcapsular & without  rupture / Intra-abdominal fluid collection/LUQ  -CCS: nothing to do for spleen/self limiting  -continue monitoring -no signs of acute bleeding or abdominal pain.  11-Recent hyperglycemia  -cont SSI  -due to tube feedings -CBG's in the 110's-120's range  12-Persistent sinus tachy  Likely multifactorial  -no CP or SOB -Will keep hydrated.   -TSH WNL -low probability on V/Q scan for PE  -will cont low dose B-blocker; HR better and BP stable. -Hgb down to 6.5; which can contribute to his tachycardia. Received 2 units of PRBC's and HR even better today.  13-Leukocytosis: WBC trending down. Most likely due to stress demargination and recent asp PNA.  -UA and urine cx WNL -neg C. Diff -continue holding for ay further abx's at this moment. -patient afebrile and otherwise slowly improving.   Code Status: Full Family Communication: Care discussed with wife who was at bedside.  Disposition Plan: CIR vs SNF vs Home  Consultants: CCS  Procedures: 2/03- Flex sig  2/03 - Decompressive Cecostomy due to colonic stricture and megacolon  2/04 - Total abdominal colectomy and end ileostomy for toxic megacolon  2/09- Thoracentesis  2/26-Cystoscpy w/ BRP  3/03-EL/Liver bx/appliation wound VAC  3/06-EL/open abd wound VAC change  3/09-removal VAC dressing and abdominal closure  4/5 - PEG tube per IR   Antibiotics: PCN, Imipenem ALLERGIC - rash  S/p full treatment x 3 weeks for cdiff  Aztreonam 3/29>>> 3/31  vanc 3/29>>> 3/31   HPI/Subjective: Patient afebrile; overall feeling better and tolerating advance of TF's up to 75cc/hr (goal); no nausea, no vomiting or abdominal pain. Hgb now 9.5 after transfusion, feels a lot stronger.  Objective: Filed Vitals:   05/08/12 1610 05/08/12 0150 05/08/12 9604 05/08/12 0901  BP: 105/66 109/62 97/58 122/70  Pulse: 110 99 101 94  Temp: 98.7 F (37.1 C) 98.7 F (37.1 C) 97.4 F (36.3 C)   TempSrc: Oral Oral Axillary   Resp: 18 20 18    Height:       Weight:      SpO2:   99%     Intake/Output Summary (Last 24 hours) at 05/08/12 1018 Last data filed at 05/08/12 0910  Gross per 24 hour  Intake  132.5 ml  Output   2650 ml  Net -2517.5 ml   Filed Weights   05/02/12 0557 05/06/12 0556 05/07/12 0542  Weight: 68.6 kg (151 lb 3.8 oz) 65.1 kg (143 lb 8.3 oz) 66.2 kg (145 lb 15.1 oz)    Exam:   General: AAOX3; no acute complaints; feeling better and stronger according to patient and family.  Cardiovascular: S 1, S 2 RRR  Respiratory: improved air movement, no wheezes; scattered rhonchi.   Abdomen: peg tube in place, ileostomy stoma stable, wound VAC over midline abdominal incision; clean dressings and wound in healing process. Mild distension, no significant tenderness. Positive BS  Musculoskeletal: no edema. Decrease range of motion by commands on his right side; also with spasticity on his RLE.  Data Reviewed: Basic Metabolic Panel:  Recent Labs Lab 05/01/12 2329 05/02/12 0415 05/04/12 0600 05/07/12 0900 05/08/12 0630  NA  --  141 135 135 136  K  --  4.3 4.2 4.3 3.6  CL  --  111 104 102 99  CO2  --  22 23 25 25   GLUCOSE  --  115* 126* 109* 109*  BUN  --  18 16 18 20   CREATININE  --  0.86 0.84 0.73 0.78  CALCIUM  --  7.9* 8.1* 8.5 8.4  MG 1.5  --   --   --   --   PHOS 2.3  --   --   --   --    CBC:  Recent Labs Lab 05/02/12 0415 05/03/12 0520 05/06/12 0500 05/07/12 0900 05/08/12 0630  WBC 18.1* 16.5* 19.9*  --  13.7*  HGB 7.2* 7.3* 7.0* 6.5* 9.5*  HCT 22.1* 22.8* 21.5* 20.0* 27.5*  MCV 93.2 92.3 90.3  --  86.5  PLT 376 389 520*  --  404*   BNP (last 3 results)  Recent Labs  04/10/12 0650  PROBNP 239.6*   CBG:  Recent Labs Lab 05/07/12 0749 05/07/12 1211 05/07/12 1652 05/07/12 2232 05/08/12 0800  GLUCAP 113* 104* 91 102* 119*    Recent Results (from the past 240 hour(s))  URINE CULTURE     Status: None   Collection Time    05/01/12  5:30 PM      Result Value Range Status    Specimen Description URINE, CLEAN CATCH   Final   Special Requests NONE   Final   Culture  Setup Time 05/01/2012 18:41   Final   Colony Count NO GROWTH   Final   Culture NO GROWTH   Final   Report Status 05/02/2012 FINAL   Final  CLOSTRIDIUM DIFFICILE BY PCR     Status: None   Collection Time    05/03/12  5:10 AM      Result Value Range Status   C difficile by pcr NEGATIVE  NEGATIVE Final     Studies: No results found.  Scheduled Meds: . aspirin  81 mg Per Tube Daily  . chlorhexidine  15 mL Mouth Rinse BID  . darbepoetin (ARANESP) injection -  NON-DIALYSIS  40 mcg Subcutaneous Q Tue-1800  . feeding supplement (VITAL AF 1.2 CAL)  1,000 mL Per Tube Q24H  . ferrous sulfate  300 mg Per Tube TID WC  . free water  200 mL Per Tube Q3H  . insulin aspart  0-5 Units Subcutaneous QHS  . insulin aspart  0-9 Units Subcutaneous TID WC  . lidocaine  1 patch Transdermal Q24H  . lip balm   Topical BID  . loperamide  2 mg Per Tube QHS  . metoCLOPramide  5 mg Oral TID  . metoprolol tartrate  6.25 mg Per Tube Daily  . multivitamin  5 mL Per Tube Daily  . pantoprazole sodium  40 mg Per Tube Q1200  . potassium chloride  20 mEq Oral BID  . 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    [facility]  [facility] Pager [phone]. If 7PM-7AM, please  contact night-coverage at [email or web address], password [facility] 05/08/2012, 10:18 AM  LOS: 13 days

## 2012-05-08 NOTE — Progress Notes (Signed)
NUTRITION FOLLOW UP  Intervention:   1. Enteral nutrition; Pt has tolerated Vital 1.2 @ goal rate for 2 days without c/o nausea or episodes of emesis, however pt has been taking long breaks during the day while in the chair-off tube feeds for  6-10 hrs per wife report. Recommend begin transitioning pt to cyclic feeds while continuing to meet as close to 100% of needs with nutrition support as possible.  Pt would benefit from 20 hr cyclic feeds in anticipation of potential d/c to CIR with intense rehab schedule. Recommend increase to 85 mL/hr x21 hrs daily which provides 2142 kcal, 133g protein, 1368 mL free water. Pt PO intake in the past has been negligible and inconsistent- note limited participation with SLP.  Please inform RD of any decisions r/t to holding or transitioning to a different regimen. Pt is receiving 200 mL free water boluses TID which provides 1968 mL free water daily.  2.  Nutrition-related medications; continue MVI 4. RD to continue to follow nutrition care plan and for pt's progress with SLP and d/c planning.   Nutrition Dx:   Inadequate oral intake, ongoing.   Goal:  Intake to meet >90% of estimated nutrition needs. Unmet,.  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  Pt with acute respiratory and renal failure with ARDS and septic shock. Pt recently discharged to CIR, however currently admitted as inpatient due to possible aspiration PNA (4/2).  Hospital course nutrition summary:  2/2 - 2/20: permissively underfed with EN  2/21-2/26: advanced to full feeds  2/26 - 3/3: feeds held for surgery  3/4 - 3/11: resumed full feeds  3/11 - 3/12: feeds held for GI bleed  3/13 - 3/24:  received full feeds  3/24: TFs discontinued diet initiated; pt with very poor oral intake; tx to rehab 3/25  3/28 - 4/1: increased n/v - pt refusing almost all meals and supplements; wife declined calorie count, requesting TPN  4/2: NGT placed to suction, surgery ordering TF initiation; tx back to acute with plans for trickle feedings. 4/3-4/4:  Trickle feeds of Vital 1.2 @ 30 mL/hr 4/5: PEG placement, resume of Vital 1.2 4/6-4/9:  Inconsistent TF infusion and frequent adjustment of rate.  Pt has not yet reached >40 mL/hr with tolerance.  Wt now 151 lbs. 4/10-11:  Pt has advanced as scheduled to 60 mL/hr.  Discussed RN.  Plan to increase to 70 mL this afternoon. 4/14:  Pt has reached goal TF rate of 75 mL/hr with tolerance.  PO intake remains insufficient with improvement per pt's report. 4/15:  Pt ready to begin transition to cyclic feeds.  RD to order Vital 1.2 @ 85 mL/hr x21 hrs daily. Will continue with daily advancements as tolerated and shorter duration of tubefeeds.    Residual:  50 mL at last check.    Pt continues to meet criteria for severe malnutrition of acute illness given 29% wt loss in >1 month and PO/nutrition support meeting </=75% of estimated needs for >1 month.  Pt has persistently struggled with nausea since admission which contributed to inability to tolerate TFs. Pt has NOT complained of nausea since starting trickle feeds with slow advancements and has now reached goal rate with tolerance.  If Pt c/o nausea, check residuals,  consider limiting POs to small boluses, and continuing TFs at last known tolerated rate of 50 mL/hr x4 hrs, then reassess for advancement to 75 mL/hr continuous.    RD strongly recommends ongoing nutrition support for pt with continued PO intake at meals and participation in [place] for swallowing improvement and long-term goal of resuming full PO feeds.  Pt still has not yet reached goal volume of continuous TFs and is not likely capable of consuming adequate  nutrition with PO alone at this time due to lethargy, deconditioning, poor appetite, and early satiety.  Would recommend making adjustments to TF based on PO performance with SLP.  Note pt's hesitancy with PO intake and limited acceptance. Pt reports struggling with poor taste and anorexia.   Height: Ht Readings from Last 1 Encounters:  04/25/12 5' 9"  (1.753 m)    Weight Status:   Wt Readings from Last 1 Encounters:  05/07/12 145 lb 15.1 oz (66.2 kg)    Re-estimated needs:  Kcal: 2200-2400 Protein: 120-140g Fluid: >2.2 L/day  Skin: incisions, ileostomy, otherwise intact  Diet Order: General   Intake/Output Summary (Last 24 hours) at 05/08/12 1429 Last data filed at 05/08/12 1100  Gross per 24 hour  Intake   12.5 ml  Output   2950 ml  Net -2937.5 ml    Last BM: 4/9   Labs:   Recent Labs Lab 05/01/12 2329  05/04/12 0600 05/07/12 0900 05/08/12 0630  NA  --   < > 135 135 136  K  --   < > 4.2 4.3 3.6  CL  --   < > 104 102 99  CO2  --   < > 23 25 25   BUN  --   < > 16 18 20   CREATININE  --   < > 0.84 0.73 0.78  CALCIUM  --   < > 8.1* 8.5 8.4  MG 1.5  --   --   --   --   PHOS 2.3  --   --   --   --   GLUCOSE  --   < > 126* 109* 109*  < > = values in this interval not displayed.  CBG (last 3)   Recent Labs  05/07/12 2232 05/08/12 0800 05/08/12 1146  GLUCAP 102* 119* 136*    Scheduled Meds: . aspirin  81 mg Per Tube Daily  . chlorhexidine  15 mL Mouth Rinse BID  . darbepoetin (ARANESP) injection - NON-DIALYSIS  40 mcg Subcutaneous Q Tue-1800  . feeding supplement (VITAL AF 1.2 CAL)  1,000 mL Per Tube Q24H  . ferrous sulfate  300 mg Per Tube TID WC  . free water  200 mL Per Tube Q3H  . insulin aspart  0-5 Units Subcutaneous QHS  . insulin aspart  0-9 Units Subcutaneous TID WC  . lidocaine  1 patch Transdermal Q24H  . lip balm   Topical BID  . loperamide  2 mg Per Tube QHS  . metoCLOPramide  5 mg Oral TID  . metoprolol tartrate  6.25 mg Per Tube  Daily  . multivitamin  5 mL Per Tube Daily  . pantoprazole sodium  40 mg Per Tube Q1200  . potassium chloride  20 mEq Oral BID  . saccharomyces boulardii  250 mg Oral BID    Continuous Infusions: none   Brynda Greathouse, MS RD LDN Clinical Inpatient Dietitian Pager: [phone] Weekend/After hours pager: [phone]

## 2012-05-08 NOTE — Progress Notes (Signed)
I will begin insurance authorization for a possible admit to inpt rehab pending their approval. [phone]

## 2012-05-08 NOTE — Progress Notes (Signed)
Asked to reevaluate patient by primary service. Spoke to triad hospitalist Dr. Mylo Red to RN as well as the dietitian who is caring for the patient today  Up in chair for 6 hours today.  Tolerating small by mouth feeds. Nutritional needs being supplied via PEG tube Therapy notes reviewed. Sitting tolerance is improved. Sitting balance is improved. Activity tolerance is improved. Still requiring total assist for transfers.  Spoke with wife. She is committed to taking patient home but is not quite able to care for Mrs. Current time.  Examination Gen. No acute distress Mood and affect are appropriate Memory he does not report called being on the rehabilitation unit very well but remembers certain details Lungs are clear Heart regular rate and rhythm Abdomen midline incision granulating well. No evidence of drainage. PEG site well healed. Right lower quadrant colostomy without leakage. Motor strength is 2 minus in the right deltoid bicep tricep and grip. Left upper extremity 2 minus deltoid 3+ in the biceps triceps grip finger extensors. 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 His nutritional status has improved now that he has a PEG tube. His activity tolerance is improving. I think it's reasonable to give him a one-week trial rehabilitation and if he makes improvements and is able to tolerate therapy we can extend the stay. Otherwise we'll switch to a family training type admission. Will ask rehabilitation admission coordinator to work with the insurance on this.

## 2012-05-09 ENCOUNTER — Inpatient Hospital Stay (HOSPITAL_COMMUNITY)
Admission: RE | Admit: 2012-05-09 | Discharge: 2012-06-06 | DRG: 462 | Disposition: A | Discharge status: Home Health | Payer: BC Managed Care – PPO | Source: Intra-hospital | Attending: Physical Medicine & Rehabilitation | Admitting: Physical Medicine & Rehabilitation

## 2012-05-09 ENCOUNTER — Encounter (HOSPITAL_COMMUNITY): Payer: Self-pay | Admitting: Internal Medicine

## 2012-05-09 DIAGNOSIS — D72829 Elevated white blood cell count, unspecified: Secondary | ICD-10-CM

## 2012-05-09 DIAGNOSIS — K5190 Ulcerative colitis, unspecified, without complications: Secondary | ICD-10-CM

## 2012-05-09 DIAGNOSIS — N179 Acute kidney failure, unspecified: Secondary | ICD-10-CM

## 2012-05-09 DIAGNOSIS — R188 Other ascites: Secondary | ICD-10-CM | POA: Diagnosis present

## 2012-05-09 DIAGNOSIS — Z8673 Personal history of transient ischemic attack (TIA), and cerebral infarction without residual deficits: Secondary | ICD-10-CM

## 2012-05-09 DIAGNOSIS — D689 Coagulation defect, unspecified: Secondary | ICD-10-CM | POA: Diagnosis present

## 2012-05-09 DIAGNOSIS — Z5189 Encounter for other specified aftercare: Principal | ICD-10-CM

## 2012-05-09 DIAGNOSIS — D638 Anemia in other chronic diseases classified elsewhere: Secondary | ICD-10-CM

## 2012-05-09 DIAGNOSIS — R5381 Other malaise: Secondary | ICD-10-CM

## 2012-05-09 DIAGNOSIS — S43001A Unspecified subluxation of right shoulder joint, initial encounter: Secondary | ICD-10-CM

## 2012-05-09 DIAGNOSIS — E41 Nutritional marasmus: Secondary | ICD-10-CM

## 2012-05-09 DIAGNOSIS — R233 Spontaneous ecchymoses: Secondary | ICD-10-CM

## 2012-05-09 DIAGNOSIS — Z932 Ileostomy status: Secondary | ICD-10-CM

## 2012-05-09 DIAGNOSIS — F4321 Adjustment disorder with depressed mood: Secondary | ICD-10-CM

## 2012-05-09 DIAGNOSIS — J690 Pneumonitis due to inhalation of food and vomit: Secondary | ICD-10-CM

## 2012-05-09 DIAGNOSIS — F411 Generalized anxiety disorder: Secondary | ICD-10-CM

## 2012-05-09 DIAGNOSIS — J189 Pneumonia, unspecified organism: Secondary | ICD-10-CM

## 2012-05-09 DIAGNOSIS — R1310 Dysphagia, unspecified: Secondary | ICD-10-CM

## 2012-05-09 DIAGNOSIS — J90 Pleural effusion, not elsewhere classified: Secondary | ICD-10-CM | POA: Diagnosis present

## 2012-05-09 DIAGNOSIS — T50995A Adverse effect of other drugs, medicaments and biological substances, initial encounter: Secondary | ICD-10-CM

## 2012-05-09 DIAGNOSIS — Z931 Gastrostomy status: Secondary | ICD-10-CM

## 2012-05-09 DIAGNOSIS — G8250 Quadriplegia, unspecified: Secondary | ICD-10-CM

## 2012-05-09 DIAGNOSIS — I639 Cerebral infarction, unspecified: Secondary | ICD-10-CM | POA: Diagnosis present

## 2012-05-09 DIAGNOSIS — Y9210 Unspecified residential institution as the place of occurrence of the external cause: Secondary | ICD-10-CM

## 2012-05-09 DIAGNOSIS — Z87891 Personal history of nicotine dependence: Secondary | ICD-10-CM

## 2012-05-09 DIAGNOSIS — D62 Acute posthemorrhagic anemia: Secondary | ICD-10-CM | POA: Diagnosis present

## 2012-05-09 DIAGNOSIS — I4891 Unspecified atrial fibrillation: Secondary | ICD-10-CM

## 2012-05-09 DIAGNOSIS — M6289 Other specified disorders of muscle: Secondary | ICD-10-CM

## 2012-05-09 DIAGNOSIS — G7281 Critical illness myopathy: Secondary | ICD-10-CM | POA: Diagnosis present

## 2012-05-09 DIAGNOSIS — G5630 Lesion of radial nerve, unspecified upper limb: Secondary | ICD-10-CM

## 2012-05-09 DIAGNOSIS — Z79899 Other long term (current) drug therapy: Secondary | ICD-10-CM

## 2012-05-09 DIAGNOSIS — Y95 Nosocomial condition: Secondary | ICD-10-CM

## 2012-05-09 LAB — GLUCOSE, CAPILLARY: Glucose-Capillary: 117 mg/dL — ABNORMAL HIGH (ref 70–99)

## 2012-05-09 LAB — HEMOGLOBIN AND HEMATOCRIT, BLOOD
HCT: 30.8 % — ABNORMAL LOW (ref 39.0–52.0)
Hemoglobin: 10.3 g/dL — ABNORMAL LOW (ref 13.0–17.0)

## 2012-05-09 MED ORDER — GUAIFENESIN-DM 100-10 MG/5ML PO SYRP: 5.0000 mL | ORAL_SOLUTION | Freq: Four times a day (QID) | ORAL | Status: DC | PRN

## 2012-05-09 MED ORDER — PROCHLORPERAZINE EDISYLATE 5 MG/ML IJ SOLN
10.0000 mg | Freq: Four times a day (QID) | INTRAMUSCULAR | Status: DC | PRN
  Filled 2012-05-09: qty 2

## 2012-05-09 MED ORDER — MAGIC MOUTHWASH: 15.0000 mL | Freq: Four times a day (QID) | ORAL | Status: DC | PRN

## 2012-05-09 MED ORDER — FREE WATER
200.0000 mL | Status: DC
  Administered 2012-05-09 – 2012-05-25 (×120): 200 mL

## 2012-05-09 MED ORDER — POTASSIUM CHLORIDE 20 MEQ/15ML (10%) PO LIQD
20.0000 meq | Freq: Two times a day (BID) | ORAL | Status: DC
  Administered 2012-05-09 – 2012-05-10 (×2): 20 meq via ORAL
  Filled 2012-05-09 (×4): qty 15

## 2012-05-09 MED ORDER — INSULIN ASPART 100 UNIT/ML ~~LOC~~ SOLN
0.0000 [IU] | Freq: Three times a day (TID) | SUBCUTANEOUS | Status: DC
  Administered 2012-05-11: 1 [IU] via SUBCUTANEOUS

## 2012-05-09 MED ORDER — METOCLOPRAMIDE HCL 5 MG PO TABS
5.0000 mg | ORAL_TABLET | Freq: Three times a day (TID) | ORAL | Status: DC
  Administered 2012-05-09 – 2012-05-10 (×2): 5 mg via ORAL
  Filled 2012-05-09 (×5): qty 1

## 2012-05-09 MED ORDER — VITAL 1.5 CAL PO LIQD
1000.0000 mL | ORAL | Status: DC
  Administered 2012-05-09: 1000 mL
  Filled 2012-05-09 (×3): qty 1000

## 2012-05-09 MED ORDER — DARBEPOETIN ALFA-POLYSORBATE 40 MCG/0.4ML IJ SOLN
40.0000 ug | INTRAMUSCULAR | Status: AC
  Administered 2012-05-15 – 2012-05-22 (×2): 40 ug via SUBCUTANEOUS
  Filled 2012-05-09 (×4): qty 0.4

## 2012-05-09 MED ORDER — PHENOL 1.4 % MT LIQD
2.0000 | OROMUCOSAL | Status: DC | PRN
Start: 2012-05-09 — End: 2012-06-06
  Filled 2012-05-09: qty 177

## 2012-05-09 MED ORDER — ACETAMINOPHEN 325 MG PO TABS
325.0000 mg | ORAL_TABLET | ORAL | Status: DC | PRN
  Administered 2012-05-10 (×2): 650 mg via ORAL
  Filled 2012-05-09 (×2): qty 2

## 2012-05-09 MED ORDER — ADULT MULTIVITAMIN LIQUID CH
5.0000 mL | Freq: Every day | ORAL | Status: DC
  Administered 2012-05-10 – 2012-06-06 (×28): 5 mL
  Filled 2012-05-09 (×30): qty 5

## 2012-05-09 MED ORDER — METOPROLOL TARTRATE 25 MG/10 ML ORAL SUSPENSION
6.2500 mg | Freq: Every day | ORAL | Status: DC
  Administered 2012-05-10 – 2012-06-06 (×28): 6.25 mg
  Filled 2012-05-09 (×29): qty 2.5

## 2012-05-09 MED ORDER — ALUM & MAG HYDROXIDE-SIMETH 200-200-20 MG/5ML PO SUSP
15.0000 mL | ORAL | Status: DC | PRN
  Administered 2012-05-09 – 2012-05-28 (×3): 15 mL via ORAL
  Filled 2012-05-09 (×4): qty 30

## 2012-05-09 MED ORDER — LIDOCAINE HCL 2 % EX GEL
CUTANEOUS | Status: DC | PRN
  Administered 2012-05-28: 5 via TOPICAL

## 2012-05-09 MED ORDER — INSULIN ASPART 100 UNIT/ML ~~LOC~~ SOLN: 0.0000 [IU] | Freq: Every day | SUBCUTANEOUS | Status: DC

## 2012-05-09 MED ORDER — BLISTEX EX OINT
TOPICAL_OINTMENT | Freq: Two times a day (BID) | CUTANEOUS | Status: DC
  Administered 2012-05-09 – 2012-05-25 (×32): via TOPICAL
  Administered 2012-05-26: 1 via TOPICAL
  Administered 2012-05-26 – 2012-06-02 (×14): via TOPICAL
  Administered 2012-06-02: 1 via TOPICAL
  Administered 2012-06-03 – 2012-06-06 (×7): via TOPICAL
  Filled 2012-05-09 (×2): qty 10

## 2012-05-09 MED ORDER — DIPHENHYDRAMINE HCL 12.5 MG/5ML PO ELIX
12.5000 mg | ORAL_SOLUTION | Freq: Four times a day (QID) | ORAL | Status: DC | PRN
  Filled 2012-05-09: qty 10

## 2012-05-09 MED ORDER — ASPIRIN 81 MG PO CHEW
81.0000 mg | CHEWABLE_TABLET | Freq: Every day | ORAL | Status: DC
  Administered 2012-05-10 – 2012-06-06 (×28): 81 mg
  Filled 2012-05-09 (×29): qty 1

## 2012-05-09 MED ORDER — MENTHOL 3 MG MT LOZG
1.0000 | LOZENGE | OROMUCOSAL | Status: DC | PRN
Start: 2012-05-09 — End: 2012-06-06

## 2012-05-09 MED ORDER — FERROUS SULFATE 300 (60 FE) MG/5ML PO SYRP
300.0000 mg | ORAL_SOLUTION | Freq: Three times a day (TID) | ORAL | Status: DC
  Administered 2012-05-10 – 2012-05-23 (×40): 300 mg
  Filled 2012-05-09 (×43): qty 5

## 2012-05-09 MED ORDER — OXYCODONE HCL 5 MG/5ML PO SOLN
5.0000 mg | ORAL | Status: DC | PRN
  Administered 2012-05-14: 5 mg
  Filled 2012-05-09: qty 5

## 2012-05-09 MED ORDER — TEMAZEPAM 7.5 MG PO CAPS
7.5000 mg | ORAL_CAPSULE | Freq: Every evening | ORAL | Status: DC | PRN
  Administered 2012-05-09: 7.5 mg via ORAL
  Filled 2012-05-09: qty 1

## 2012-05-09 MED ORDER — CHLORHEXIDINE GLUCONATE 0.12 % MT SOLN
15.0000 mL | Freq: Two times a day (BID) | OROMUCOSAL | Status: DC
  Administered 2012-05-09 – 2012-05-28 (×38): 15 mL via OROMUCOSAL
  Filled 2012-05-09 (×42): qty 15

## 2012-05-09 MED ORDER — SIMETHICONE 40 MG/0.6ML PO SUSP
80.0000 mg | Freq: Four times a day (QID) | ORAL | Status: DC | PRN
  Administered 2012-05-14: 80 mg via ORAL
  Filled 2012-05-09 (×2): qty 1.2

## 2012-05-09 MED ORDER — LIDOCAINE 5 % EX PTCH
1.0000 | MEDICATED_PATCH | CUTANEOUS | Status: DC
  Administered 2012-05-10 – 2012-06-06 (×25): 1 via TRANSDERMAL
  Filled 2012-05-09 (×28): qty 1

## 2012-05-09 MED ORDER — PANTOPRAZOLE SODIUM 40 MG PO PACK
40.0000 mg | PACK | Freq: Every day | ORAL | Status: DC
  Administered 2012-05-10 – 2012-06-04 (×26): 40 mg
  Filled 2012-05-09 (×29): qty 20

## 2012-05-09 MED ORDER — LOPERAMIDE HCL 1 MG/5ML PO LIQD
2.0000 mg | Freq: Every day | ORAL | Status: DC
  Administered 2012-05-09: 2 mg
  Filled 2012-05-09 (×2): qty 10

## 2012-05-09 MED ORDER — SACCHAROMYCES BOULARDII 250 MG PO CAPS
250.0000 mg | ORAL_CAPSULE | Freq: Two times a day (BID) | ORAL | Status: DC
  Administered 2012-05-09 – 2012-05-31 (×44): 250 mg via ORAL
  Filled 2012-05-09 (×48): qty 1

## 2012-05-09 MED ORDER — LOPERAMIDE HCL 2 MG PO CAPS: 2.0000 mg | ORAL_CAPSULE | Freq: Three times a day (TID) | ORAL | Status: DC | PRN

## 2012-05-09 MED ORDER — VITAL 1.5 CAL PO LIQD
1000.0000 mL | ORAL | Status: DC
  Administered 2012-05-10 – 2012-05-20 (×9): 1000 mL
  Filled 2012-05-09 (×26): qty 1000

## 2012-05-09 NOTE — PMR Pre-admission (Signed)
PMR Admission Coordinator Pre-Admission Assessment  Patient: Connor Small is an 52 y.o., male MRN: [id number] DOB: January 13, 1961 Height: 5\' 9"  (175.3 cm) Weight: 66.2 kg (145 lb 15.1 oz)              Insurance Information HMO:     PPO: yes     PCP:      IPA:      80/20:      OTHER:  PRIMARY: [employer] of [place]      Policy#: [id number]      Subscriber: pt CM Name: Victorio Palm      Phone#: [phone]     Fax#: [phone] Pre-Cert#: [id number]     Employer: [employer]. Update due 05/16/12 Benefits:  Phone #: [phone]     Name: 4/16 [facility]. Date: 01/25/12 active     Deduct: $500      Out of Pocket Max: $3500      Life Max: unlimited. All met CIR: after deductible 80%      SNF: after deductible 80% Outpatient: 80% 90 visits combined     Co-Pay: PT and OT combined. 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 He was admitted to [facility] on 02/26/2012 with C. difficile colitis. He initially underwent exploratory laparotomy for a cecostomy tube placement by Dr. Dian Situ on 02/27/2012 because of colonic stricture. 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 Because of septic shock with acute renal failure and Atrial fib with RVR he was transferred to [facility] 03/01/2012.  He required  acute dialysis/CVVHD and was followed by pulmonary critical care medicine for his sepsis and shock that required pressors. CCS was consulted in regards to the surgical problems. He developed wound evisceration and underwent multiple procedures with eventual closure and placement of retention sutures on 04/01/2012 by Dr. Johna Sheriff. 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 There was also question of heparin induced thrombocytopenia. He was transitioned to Argatroban and developed a GI bleed on 04/02/2012. He had persistent lower extremity edema but Dopplers were negative for DVT. He has a combination of anemia of critical illness as well as acute blood loss anemia and has received multiple packed red blood cells this admission with most recent hemoglobin of >7.0. Because of diffuse weakness, especially right upper extremity weakness, neurology was consulted. 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 Dr. Pearlean Brownie recommended aspirin initially with transition to anticoagulation once thrombocytopenia and transaminitis resolved. Because of patient's difficulty swallowing and tolerating enteral feeds initiation of oral medications including anticoagulation has not been limited. His feeding tube was initially discontinued on 04/16/2012 and his diet was  advanced to dysphagia 3. 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 PT and OT recommended patient proceed with inpatient rehabilitation and he was admitted to that unit on 04/17/2012.  After admission to the rehabilitation unit 04/21/2012 the patient developed nausea & vomiting and chest x-ray revealed a right lower lobe pneumonia likely related to aspiration. He also had associated  leukocytosis. Critical care medicine was reconsult at and they recommended beginning vancomycin Azactam for possible healthcare associated pneumonia. Unfortunately despite attempts to allow for oral intake he has had significant anorexia and poor intake. Surgery has continued to follow patient and because of anorexia and vomiting there was a suspicion for an ileus. CT scan of the abdomen revealed a loculated left upper quadrant fluid collection. Subsequently Dr. Michaell Cowing with the surgical team debrided the abdominal wound and removed the bolsters and retention sutures on 04/24/2012. A nasogastric tube was replaced on 04/24/2012 as well without any improvement in his GI symptoms despite being to intermittent suction and draining bile. Unfortunately his white count continued to rise (now 15,600) despite having recently been on antibiotics. Because of multiple medical problems that would interfere with successful pursuit of rehabilitative therapy the patient was transferred to the acute care hospital 04/25/2012. Team 1 assumed care of the patient on 04/03/2014back to acute hospital. On 4/5 PEG tube placed by IR.Malnutrition major factor contributing to slow recovery/wound healing and 3rd spacing shifting to low albumin. Paracentesis peformed on 4/5 with 750 cc fluid removed prior to Gtube. Pleural effusions L > R with HCAP vs aspiration pneumonitis. Completed Antibiotics. Anemia of chronic disease with HGb < 7 on 4/14 and pt received 2 units PRBCs with most recent Hgb 9.5.  Past Medical History  Past Medical History  Diagnosis Date  . Chronic diarrhea   . Rectal bleed   . Hemorrhoids   . Ulcerative colitis     Distal UC over 8 yrs ago diagnosed  . Diverticulitis of large intestine with perforation 10/2011    done at [facility]  . 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 history of UC (ulcerative colitis) 12/06/2010    Family History  family history includes Cancer in his sister; Healthy in his daughter; and  Hypertension in his mother.  Prior Rehab/Hospitalizations: CIR 04/16/12 through 04/25/12   Current Medications  Current facility-administered medications:acetaminophen (TYLENOL) solution 650 mg, 650 mg, Per Tube, Q6H PRN, Lonia Blood, MD, 650 mg at 05/09/12 1126;  alum & mag hydroxide-simeth (MAALOX/MYLANTA) 200-200-20 MG/5ML suspension 15 mL, 15 mL, Oral, Q4H PRN, Richarda Overlie, MD, 15 mL at 05/03/12 2010;  aspirin chewable tablet 81 mg, 81 mg, Per Tube, Daily, Lonia Blood, MD, 81 mg at 05/09/12 1039 chlorhexidine (PERIDEX) 0.12 % solution 15 mL, 15 mL, Mouth Rinse, BID, Richarda Overlie, MD, 15 mL at 05/09/12 1040;  darbepoetin (ARANESP) injection 40 mcg, 40 mcg, Subcutaneous, Q Tue-1800, Drake Leach Rumbarger, RPH, 40 mcg at 05/08/12 2044;  diphenhydrAMINE (BENADRYL) 12.5 MG/5ML elixir 12.5 mg, 12.5 mg, Per Tube, Q6H PRN, Lonia Blood, MD feeding supplement (VITAL 1.5 CAL) liquid 1,000 mL, 1,000 mL, Per Tube, Continuous, Ashley Jacobs, RD, Last Rate: 80 mL/hr at 05/09/12 1436, 1,000 mL at 05/09/12 1436;  ferrous sulfate 300 (60 FE) MG/5ML syrup 300 mg, 300 mg, Per Tube, TID WC, Madolyn Frieze, RPH, 300 mg at 05/09/12 1039;  free water 200 mL, 200 mL, Per Tube, Q3H, Lonia Blood, MD, 200 mL at 05/09/12 1200  guaiFENesin-dextromethorphan (ROBITUSSIN DM) 100-10 MG/5ML syrup 5-10 mL, 5-10 mL, Per Tube, Q6H PRN, Lonia Blood, MD, 5 mL at 05/04/12 2103;  insulin aspart (novoLOG) injection 0-5 Units, 0-5 Units, Subcutaneous, QHS, Richarda Overlie, MD;  insulin aspart (novoLOG) injection 0-9 Units, 0-9 Units, Subcutaneous, TID WC, Richarda Overlie, MD, 1 Units at 05/04/12 1230 lidocaine (LIDODERM) 5 % 1 patch, 1 patch, Transdermal, Q24H, Richarda Overlie, MD, 1 patch at 05/09/12 1040;  lidocaine (XYLOCAINE) 2 % jelly, , Topical, PRN, Richarda Overlie, MD;  lip balm (BLISTEX) ointment, , Topical, BID, Richarda Overlie, MD;  loperamide (IMODIUM) 1 MG/5ML solution 2 mg, 2 mg, Per Tube, QHS, Lonia Blood, MD, 2 mg at 05/09/12 0013;  loperamide (IMODIUM) capsule 2-4 mg, 2-4 mg, Oral, Q8H PRN, Richarda Overlie, MD LORazepam (ATIVAN) injection 0.5 mg, 0.5 mg, Intravenous, Q12H PRN, Vassie Loll, MD, 0.5 mg at 05/07/12 0251;  magic mouthwash, 15 mL, Oral, QID PRN, Richarda Overlie, MD, 5 mL at 05/05/12 2136;  menthol-cetylpyridinium (CEPACOL) lozenge 3 mg, 1 lozenge, Oral, PRN, Richarda Overlie, MD;  metoCLOPramide (REGLAN) tablet 5 mg, 5 mg, Oral, TID, Vassie Loll, MD, 5 mg at 05/09/12 1436 metoprolol tartrate (LOPRESSOR) 25 mg/10 mL oral suspension 6.25 mg, 6.25 mg, Per Tube, Daily, Vassie Loll, MD, 6.25 mg at 05/09/12 1039;  morphine 2 MG/ML injection 2 mg, 2 mg, Intravenous, Q3H PRN, Lonia Blood, MD, 2 mg at 04/30/12 0248;  multivitamin liquid 5 mL, 5 mL, Per Tube, Daily, Ashley Jacobs, RD, 5 mL at 05/09/12 1039;  oxyCODONE (ROXICODONE) 5 MG/5ML solution 5-10 mg, 5-10 mg, Per Tube, Q3H PRN, Lonia Blood, MD pantoprazole sodium (PROTONIX) 40 mg/20 mL oral suspension 40 mg, 40 mg, Per Tube, Q1200, Richarda Overlie, MD, 40 mg at 05/09/12 1436;  phenol (CHLORASEPTIC) mouth spray 2 spray, 2 spray, Mouth/Throat, PRN, Richarda Overlie, MD;  potassium chloride 20 MEQ/15ML (10%) liquid 20 mEq, 20 mEq, Oral, BID, Lonia Blood, MD, 20 mEq at 05/09/12 1039;  prochlorperazine (COMPAZINE) injection 10 mg, 10 mg, Intravenous, Q6H PRN, Vassie Loll, MD saccharomyces boulardii (FLORASTOR) capsule 250 mg, 250 mg, Oral, BID, Richarda Overlie, MD, 250 mg at 05/09/12 1436;  simethicone (MYLICON) 40 MG/0.6ML suspension 80 mg, 80 mg, Oral, QID PRN, Richarda Overlie, MD;  temazepam (RESTORIL) capsule 7.5 mg, 7.5 mg, Oral, QHS PRN, Vassie Loll, MD, 7.5 mg at 05/08/12 2228  Patients Current Diet: General Regular diet with thin liquids as well as Vital TF at 80 cc/hr via Gtube  Precautions / Restrictions Precautions Precautions: Fall;Shoulder Type of Shoulder Precautions: R shoulder subluxation Precaution Comments:  ostomy,  tachycardia, Afib, Wound vac, peg tube (doesn't wear abd binder anymore) Other Brace/Splint: R wrist cock up slint Restrictions Weight Bearing Restrictions: No Other Position/Activity Restrictions: support R UE   Prior Activity Level Community (5-7x/wk): pt working fulltime pta 02/26/12  Home Assistive Devices / Equipment Home Assistive Devices/Equipment: None  Prior Functional Level Prior Function Level of Independence: Independent Able to Take Stairs?: Yes Driving: Yes Vocation: Full time employment  Current Functional Level Cognition  Arousal/Alertness: Awake/alert Overall Cognitive Status: Impaired Difficult to assess due to: Impaired communication Current Attention Level: Selective Attention - Other Comments: difficulty attending to session when needs to clear secretions. Memory Deficits: difficulty recalling previous sessions and how he has progressed; does recall being dropped and still fearful Orientation Level: Oriented X4 Following Commands: Follows one step commands consistently;Follows one step commands with increased time General Comments: slow processing, slow to respond likely related to fatigue  factor    Extremity Assessment (includes Sensation/Coordination)  RUE ROM/Strength/Tone: Deficits RUE ROM/Strength/Tone Deficits: active shoulder shrug oncommand. min gross grasp with wrist flexion. no active release. no active elbow flex.ext. no increase in tone. subluxed (inferior)shoulder RUE Coordination: Deficits RUE Coordination Deficits: nonfunctional arm/hand  RLE ROM/Strength/Tone: Deficits RLE ROM/Strength/Tone Deficits: P/ROM WFL; grossly 2/5    ADLs  Eating/Feeding: +1 Total assistance (ice chips) Where Assessed - Eating/Feeding: Bed level Grooming: Maximal assistance;Wash/dry face Where Assessed - Grooming: Supine, head of bed up Upper Body Bathing: +1 Total assistance Where Assessed - Upper Body Bathing: Supported sitting Lower Body Bathing: +1 Total  assistance Where Assessed - Lower Body Bathing: Unsupported sitting;Rolling right and/or left Upper Body Dressing: Simulated;+1 Total assistance Where Assessed - Upper Body Dressing: Supported sitting (don RUE splint) Lower Body Dressing: +1 Total assistance Where Assessed - Lower Body Dressing: Supported sitting;Rolling right and/or left Toilet Transfer: +2 Total assistance;Simulated Toilet Transfer: Patient Percentage: 10% Toilet Transfer Method: Other (comment) (sit to partial stand) Acupuncturist: Other (comment) Antony Salmon) Equipment Used: Other (comment) (Stedy) Transfers/Ambulation Related to ADLs: Attempted sit to partial stand for WB and trunk balance in Stedy in preparation for toilet transfers and EOB sitting ADL Comments: Wife and daughter present during session. Report that patient is doing "better physically." Patient c/o pain/tightness in legs during WB activity. Patient also c/o soreness in RUE with movement/ROM. Wife reports she is doing exercises RUE and patient tolerated well. Patient educated in neck extension while seated in chair.    Mobility  Bed Mobility: Not assessed Rolling Right: 3: Mod assist;With rail Rolling Right: Patient Percentage: 20% Rolling Left: 3: Mod assist Rolling Left: Patient Percentage: 10% Left Sidelying to Sit: Not tested (comment) Left Sidelying to Sit: Patient Percentage: 10% Supine to Sit: 1: +2 Total assist Supine to Sit: Patient Percentage: 30% Sitting - Scoot to Edge of Bed: 1: +2 Total assist Sitting - Scoot to Edge of Bed: Patient Percentage: 20% Sit to Supine: 1: +2 Total assist Sit to Supine: Patient Percentage: 20% Sit to Sidelying Left: 1: +2 Total assist Sit to Sidelying Left: Patient Percentage: 10% Scooting to HOB: 1: +2 Total assist Scooting to [facility]: Patient Percentage: 0%    Transfers  Transfers: Sit to Stand;Stand to Sit Sit to Stand: 1: +2 Total assist;From chair/3-in-1;From elevated surface;With upper extremity  assist Sit to Stand: Patient Percentage: 10% Stand to Sit: 1: +2 Total assist;To chair/3-in-1;With upper extremity assist;To elevated surface Stand to Sit: Patient Percentage: 10% Transfer via Lift Equipment: Stedy    Ambulation / Gait / Stairs / Psychologist, prison and probation services  Ambulation/Gait Ambulation/Gait Assistance: Not tested (comment) Stairs: No Corporate treasurer: No    Posture / Games developer Sitting - Balance Support: Bilateral upper extremity supported;Right upper extremity supported Static Sitting - Level of Assistance: 4: Min assist Static Sitting - Comment/# of Minutes: sitting edge of chair x3 with facilitation and cueing for trunk engagement and tall posture; supported sitting on stedy with focus on trunk engagement and extension, active bearing weight through feet and lower extremities (stretching achilles tendon through active weight bearing), and neck extension; tolerated 3 minutes in supported sit in stedy Dynamic Sitting Balance Dynamic Sitting - Balance Support: Left upper extremity supported;Right upper extremity supported;Feet supported Dynamic Sitting - Level of Assistance: 4: Min assist;2: Max assist Dynamic Sitting - Balance Activities: Lateral lean/weight shifting;Forward lean/weight shifting    Special needs/care consideration Skin 05/04/12 WOC ostomy consult  CCS following for assessment and plan  of care to abd wound.  Stoma type/location: Ileostomy to right lower quad  Stomal assessment/size: Stoma red and viable, flush with skin level, 1 inch  Peristomal assessment: Intact skin surrounding  Output 100cc liquid brown stool  Ostomy pouching: 1pc. with barrier ring  Education provided: Demonstrated pouch change, pt and wife feel comfortable with the process at this point. Applied barrier ring and one piece pouch to maintain seal. [employer] discharge samples have been received at home. Wife feels independent with opening and  closing velcro to empty and deny further questions at this time.  Supplies ordered to room for staff use.  Cammie Mcgee MSN, RN, CWOCN, [place], CNS  [phone] Bowel mgmt:ileostomy Bladder mgmt:urinal    Previous Home Environment Living Arrangements: Spouse/significant other Lives With: Spouse Available Help at Discharge: Family;Available 24 hours/day Type of Home: House Home Layout: One level Home Access: Stairs to enter Entrance Stairs-Rails: None Entrance Stairs-Number of Steps: 1 step onto porch and one step into house [employer] Shower/Tub: Health visitor: Standard Bathroom Accessibility: Yes How Accessible: Accessible via walker Home Care Services: Yes Type of Home Care Services: Home PT;Home OT Additional Comments: wife fmla, family close by and will help; his brother  Discharge Living Setting Plans for Discharge Living Setting:  (wife) Type of Home at Discharge: House Discharge Home Layout: One level Discharge Home Access: Stairs to enter Entrance Stairs-Rails: None Entrance Stairs-Number of Steps: 1 step onto porch and one step into house Discharge Bathroom Shower/Tub: Walk-in shower Discharge Bathroom Toilet: Standard Discharge Bathroom Accessibility: Yes How Accessible: Accessible via walker Do you have any problems obtaining your medications?: No  Social/Family/Support Systems Anticipated Caregiver's Contact Information: see above numbers Patient Roles: Spouse;Parent;Other (Comment) (employee)  Contact Information: Murrell Converse, wife  Anticipated Caregiver: wife, brother, dtr, and other family members  Anticipated Caregiver's Contact Information: see above  Ability/Limitations of Caregiver: wife 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 Very involved.  Goals/Additional Needs Expected length of stay: Plan is for one week trial of rehab. If he makes inprovements and is able to tolerate therapy plan would be to extend the stay Dietary Needs: 4/16 regular diet with thin liquids and PEg tube feelds at 80cc/hr Additional Information Needs: If pt unable to tolerate CIR, goal would be to switch to family training type admission . Wife refuses LTACH at [facility] or SNF   Decrease burden of Care through IP rehab admission: Decrease number of caregivers   Possible need for SNF placement upon discharge: wife refused [facility], [facility] does not Copy LTACH. Wife refuses SNF. States will take him home if does not progress.  Patient Condition: This patient's condition remains as documented in the consult dated 05/08/12, in which the Rehabilitation Physician determined and documented that the patient's condition is appropriate for intensive rehabilitative care in an inpatient rehabilitation facility. Will admit to inpatient rehab today. CONSULT 05/08/12: per Dr. 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 His nutritional status has improved now that he has a PEG tube. His activity tolerance is improving. I think it's reasonable to give him a one-week trial rehabilitation and if he makes improvements and is  able to tolerate therapy we can extend the stay. Otherwise we'll switch to a family training type admission. Will ask rehabilitation admission coordinator to work with the insurance on this."  Preadmission Screen Completed By:  Clois Dupes, 05/09/2012 3:21 PM ______________________________________________________________________   Discussed status with Dr. Wynn Banker on 05/09/12 at  1519 and received telephone approval for admission today.  Admission Coordinator:   Clois Dupes, time 6962 Date 05/09/12.

## 2012-05-09 NOTE — Progress Notes (Signed)
Discussed with the PA  Leighton Ruff. Redmond Pulling, MD, FACS General, Bariatric, & Minimally Invasive Surgery [facility], [place]

## 2012-05-09 NOTE — Progress Notes (Signed)
Pt arrived to unit at 1815 with family at bedside. Belongings with pt.  Reviewed rehab process and pt and family familiar with rehab from prior admission. Aware of safety plan and process. Callbell within reach.

## 2012-05-09 NOTE — Progress Notes (Signed)
NUTRITION FOLLOW UP  Intervention:   1. Enteral nutrition; Pt tolerated Vital 1.2 @ 80 mL/hr overnight.  Discussed ongoing options for TF regimen and minimizing time off the pump.  Discussed transition to Vital 1.5 @ 80 mL/hr for 18 hrs which provides 2160 kcal, 97g protein, 1100 mL free water. Pt PO intake in the past has been negligible and inconsistent- note limited participation with SLP.  Please inform RD of any decisions r/t to holding or transitioning to a different regimen. Pt is receiving 200 mL free water boluses TID which provides 1700 mL free water daily.  2.  Nutrition-related medications; continue MVI 4. RD to continue to follow nutrition care plan and for pt's progress with SLP and d/c planning.   Nutrition Dx:   Inadequate oral intake, ongoing.   Goal:  Intake to meet >90% of estimated nutrition needs. Somewhat met, pt able to tolerate goal rate however with decreased infusion duration/time off pump.  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  Pt with acute respiratory and renal failure with ARDS and septic shock. Pt recently discharged to [facility], however currently admitted as inpatient due to possible aspiration PNA (4/2).  Hospital course nutrition summary:  2/2 - 2/20: permissively underfed with EN  2/21-2/26: advanced to full feeds  2/26 - 3/3: feeds held for surgery  3/4 - 3/11: resumed full feeds  3/11 - 3/12: feeds held for GI bleed  3/13 - 3/24: received full feeds  3/24: TFs discontinued diet initiated; pt with very poor oral intake; tx to rehab 3/25  3/28 - 4/1: increased n/v - pt refusing almost all meals and supplements; wife declined calorie count, requesting TPN  4/2:  NGT placed to suction, surgery ordering TF initiation; tx back to acute with plans for trickle feedings. 4/3-4/4:  Trickle feeds of Vital 1.2 @ 30 mL/hr 4/5: PEG placement, resume of Vital 1.2 4/6-4/9:  Inconsistent TF infusion and frequent adjustment of rate.  Pt has not yet reached >40 mL/hr with tolerance.  Wt now 151 lbs. 4/10-11:  Pt has advanced as scheduled to 60 mL/hr.  Discussed RN.  Plan to increase to 70 mL this afternoon. 4/14:  Pt has reached goal TF rate of 75 mL/hr with tolerance.  PO intake remains insufficient with improvement per pt's report. 4/15:  Pt ready to begin transition to cyclic feeds.  RD to order Vital 1.2 @ 85 mL/hr x21 hrs daily. Will continue with daily advancements as tolerated and shorter duration of tubefeeds.   4/16:  Pt agreeable to change formula to Vital 1.5 @ 80 mL/hr x 18 hrs daily.  Note pt in process for possible acceptance to CIR.    Residual:  30 mL at last check.    Pt continues to meet criteria for severe malnutrition of acute illness given 29% wt loss in >1 month and PO/nutrition support meeting </=75% of estimated needs for >1 month.  Pt has persistently struggled with nausea since admission which contributed to inability to tolerate TFs. Pt has NOT complained of nausea since starting trickle feeds with slow advancements and has now reached goal rate with tolerance.  If Pt c/o nausea, check residuals, consider limiting POs to small boluses, and continuing TFs at half rate of 40 mL/hr  continuous.    RD strongly recommends ongoing nutrition support for pt with continued PO intake at meals and participation in [place] for swallowing improvement and long-term goal of resuming full PO feeds.  Pt still has not yet reached goal volume of continuous TFs and is not likely capable of consuming adequate nutrition with PO alone at this time due to lethargy, deconditioning, poor appetite, and early satiety.  Would recommend making adjustments to TF based on PO  performance with SLP.  Note pt's hesitancy with PO intake and limited acceptance. Pt reports struggling with poor taste and anorexia.   Height: Ht Readings from Last 1 Encounters:  04/25/12 5' 9"  (1.753 m)    Weight Status:   Wt Readings from Last 1 Encounters:  05/07/12 145 lb 15.1 oz (66.2 kg)    Re-estimated needs:  Kcal: 2200-2400 Protein: 120-140g Fluid: >2.2 L/day  Skin: incisions, ileostomy, otherwise intact  Diet Order: General   Intake/Output Summary (Last 24 hours) at 05/09/12 1234 Last data filed at 05/09/12 0700  Gross per 24 hour  Intake   1960 ml  Output   1325 ml  Net    635 ml    Last BM: 4/15   Labs:   Recent Labs Lab 05/04/12 0600 05/07/12 0900 05/08/12 0630 05/09/12 0635  NA 135 135 136  --   K 4.2 4.3 3.6  --   CL 104 102 99  --   CO2 23 25 25   --   BUN 16 18 20   --   CREATININE 0.84 0.73 0.78  --   CALCIUM 8.1* 8.5 8.4  --   MG  --   --   --  1.5  PHOS  --   --   --  3.9  GLUCOSE 126* 109* 109*  --     CBG (last 3)   Recent Labs  05/08/12 2232 05/09/12 0743 05/09/12 1126  GLUCAP 104* 99 142*    Scheduled Meds: . aspirin  81 mg Per Tube Daily  . chlorhexidine  15 mL Mouth Rinse BID  . darbepoetin (ARANESP) injection - NON-DIALYSIS  40 mcg Subcutaneous Q Tue-1800  . feeding supplement (VITAL AF 1.2 CAL)  1,000 mL Per Tube Q24H  . ferrous sulfate  300 mg Per Tube TID WC  . free water  200 mL Per Tube Q3H  . insulin aspart  0-5 Units Subcutaneous QHS  . insulin aspart  0-9 Units Subcutaneous TID WC  . lidocaine  1 patch Transdermal Q24H  . lip balm   Topical BID  . loperamide  2 mg Per Tube QHS  . metoCLOPramide  5 mg Oral TID  . metoprolol tartrate  6.25 mg Per Tube Daily  . multivitamin  5 mL Per Tube Daily  . pantoprazole sodium  40 mg Per Tube Q1200  . potassium chloride  20 mEq Oral BID  . saccharomyces boulardii  250 mg Oral BID    Continuous Infusions: none   Brynda Greathouse, MS RD LDN Clinical Inpatient  Dietitian Pager: [phone] Weekend/After hours pager: [phone]

## 2012-05-09 NOTE — Discharge Summary (Signed)
stomach.  Paucity of gas in the remainder the abdomen.   Original Report Authenticated By: Lucienne Capers, M.D.    Dg Swallowing Func-speech Pathology  05/01/2012  Connor Small, [place]     05/01/2012 11:37 AM Objective Swallowing Evaluation: Modified Barium Swallowing Study   Patient Details  Name: Connor Small MRN: [id number] Date of Birth: [birth year]  Today's Date: 05/01/2012 Time: 1030-1100 SLP Time Calculation (min): 30 min  Past Medical History:  Past Medical History  Diagnosis Date  . Chronic diarrhea   . Rectal bleed   . Hemorrhoids   . Ulcerative colitis     Distal UC over 8 yrs ago diagnosed  . Diverticulitis of large intestine with perforation 10/2011    done at [facility]  . 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 history of UC (ulcerative colitis) 12/06/2010   Past Surgical History:  Past Surgical History  Procedure Laterality Date  . Colonoscopy  9/03  . Sigmoidoscopy       06/13/2002  . Temporary ostomy November 06, 2010      for a colon perforation that was done in [facility] (Dr  Payton Doughty).    . Colonoscopy  02/16/2011    Procedure: COLONOSCOPY;  Surgeon: Rogene Houston, MD;   [facility];  Service: Endoscopy;   Laterality: N/A;   100  . Flexible sigmoidoscopy  02/27/2012    Procedure: FLEXIBLE SIGMOIDOSCOPY;  Surgeon: Rogene Houston,  MD;  [facility];  Service: Endoscopy;  Laterality:  N/A;  with colonic decompression  . Laparotomy  02/27/2012    Procedure: EXPLORATORY LAPAROTOMY;  Surgeon: Donato Heinz,  MD;  [facility]: [facility];  Service: General;  Laterality: N/A;  . Cecostomy  02/27/2012    Procedure: CECOSTOMY;  Surgeon: Donato Heinz, MD;  [facility];  Service: General;  Laterality: N/A;  Cecostomy Tube  Placement  . Colectomy  02/28/2012    Procedure: TOTAL COLECTOMY;  Surgeon: Donato Heinz, MD;   [facility];  Service: General;  Laterality: N/A;  . Ileostomy  02/28/2012    Procedure: ILEOSTOMY;  Surgeon: Donato Heinz, MD;  [facility];  Service: General;  Laterality: N/A;  . Cystoscopy w/ ureteral stent placement Bilateral 03/20/2012    Procedure: CYSTOSCOPY WITH RETROGRADE PYELOGRAM/URETERAL STENT  PLACEMENT;  Surgeon: Alexis Frock, MD;  Location: [place];   Service: Urology;  Laterality: Bilateral;  . Laparotomy N/A 03/26/2012    Procedure: EXPLORATORY LAPAROTOMY;  Surgeon: Edward Jolly, MD;  Location: [place];  Service: General;  Laterality:  N/A;  . Application of wound vac N/A 03/26/2012    Procedure: APPLICATION OF WOUND VAC;  Surgeon: Edward Jolly, MD;  [facility];  Service: General;  Laterality:  N/A;  . Liver biopsy N/A 03/26/2012    Procedure: LIVER BIOPSY;  Surgeon: Edward Jolly, MD;   Location: [place];  Service: General;  Laterality: N/A;  . Laparotomy N/A 03/29/2012    Procedure: EXPLORATORY LAPAROTOMY, PARTIAL WOUND CLOSURE;   Surgeon: Edward Jolly, MD;  Location: [place];  Service:  General;  Laterality: N/A;  . Vacuum assisted closure change N/A 03/29/2012    Procedure: Open ABDOMINAL VACUUM  CHANGE;  Surgeon: Edward Jolly, MD;  Location: [place];  Service: General;  Laterality:  N/A;  . Vacuum assisted closure change N/A 04/01/2012    Procedure:  removal of abdominal vac dressing and abdominal  closure;  Surgeon: Marland Kitchen  pharyngeal peristalsis;Trace aspiration;Penetration/Aspiration  after swallow Penetration/Aspiration details (thin cup): Material enters  airway, CONTACTS cords and not ejected out;Material enters  airway, passes BELOW cords without attempt by patient to eject  out (silent aspiration);Material does not enter airway Pharyngeal - Thin Straw: Pharyngeal residue - pyriform  sinuses;Pharyngeal residue - valleculae;Penetration/Aspiration  during swallow;Reduced tongue base retraction;Reduced  airway/laryngeal closure;Reduced laryngeal elevation;Reduced  anterior laryngeal mobility;Reduced epiglottic inversion;Reduced  pharyngeal peristalsis;Trace aspiration;Penetration/Aspiration  after swallow Penetration/Aspiration details (thin straw): Material does not  enter airway;Material enters airway, remains ABOVE vocal cords  then ejected out Pharyngeal - Solids Pharyngeal - Puree: Pharyngeal residue - pyriform  sinuses;Pharyngeal residue - valleculae;Reduced tongue base  retraction;Reduced airway/laryngeal closure;Reduced laryngeal  elevation;Reduced anterior laryngeal mobility;Reduced epiglottic  inversion;Reduced pharyngeal peristalsis Penetration/Aspiration details (puree): Material does not enter  airway Pharyngeal - Mechanical Soft: Pharyngeal residue - pyriform  sinuses;Pharyngeal residue - valleculae;Reduced tongue base  retraction;Reduced airway/laryngeal closure;Reduced laryngeal  elevation;Reduced anterior laryngeal mobility;Reduced epiglottic  inversion;Reduced pharyngeal peristalsis Penetration/Aspiration details (mechanical soft): Material does  not enter airway  Cervical Esophageal Phase    GO    Cervical Esophageal Phase Cervical Esophageal Phase: Impaired Cervical Esophageal Phase - Comment Cervical Esophageal Comment: Appearance of mild protrusion of  cervical  spine at level of UES impedes complete passage to bolus  with moderate residuals pooling in pyriform sinuses post swallow        [name], MA CCC-SLP [phone]  [name] 05/01/2012, 11:35 AM     Dg Swallowing Func-speech Pathology  04/17/2012  Connor Small, CCC-SLP     04/17/2012 11:12 AM Objective Swallowing Evaluation: Modified Barium Swallowing Study   Patient Details  Name: Connor Small MRN: [id number] Date of Birth: 02-07-1960  Today's Date: 04/17/2012 Time: 0940-1010 SLP Time Calculation (min): 30 min  Past Medical History:  Past Medical History  Diagnosis Date  . Chronic diarrhea   . Rectal bleed   . Hemorrhoids   . Ulcerative colitis     Distal UC over 8 yrs ago diagnosed  . Diverticulitis of large intestine with perforation 10/2011    done at [place]   Past Surgical History:  Past Surgical History  Procedure Laterality Date  . Colonoscopy  9/03  . Sigmoidoscopy       06/13/2002  . Temporary ostomy November 06, 2010      for a colon perforation that was done in [facility] (Dr  Payton Doughty).    . Colonoscopy  02/16/2011    Procedure: COLONOSCOPY;  Surgeon: Rogene Houston, MD;   [facility];  Service: Endoscopy;  Laterality: N/A;   100  . Flexible sigmoidoscopy  02/27/2012    Procedure: FLEXIBLE SIGMOIDOSCOPY;  Surgeon: Rogene Houston,  MD;  [facility];  Service: Endoscopy;  Laterality:  N/A;  with colonic decompression  . Laparotomy  02/27/2012    Procedure: EXPLORATORY LAPAROTOMY;  Surgeon: Donato Heinz,  MD;  [facility];  Service: General;  Laterality: N/A;  . Cecostomy  02/27/2012    Procedure: CECOSTOMY;  Surgeon: Donato Heinz, MD;  [facility];  Service: General;  Laterality: N/A;  Cecostomy Tube  Placement  . Colectomy  02/28/2012    Procedure: TOTAL COLECTOMY;  Surgeon: Donato Heinz, MD;   [facility];  Service: General;  Laterality: N/A;  . Ileostomy  02/28/2012    Procedure: ILEOSTOMY;  Surgeon: Donato Heinz, MD;  [facility];  Service: General;  Laterality: N/A;  .  pharyngeal peristalsis;Trace aspiration;Penetration/Aspiration  after swallow Penetration/Aspiration details (thin cup): Material enters  airway, CONTACTS cords and not ejected out;Material enters  airway, passes BELOW cords without attempt by patient to eject  out (silent aspiration);Material does not enter airway Pharyngeal - Thin Straw: Pharyngeal residue - pyriform  sinuses;Pharyngeal residue - valleculae;Penetration/Aspiration  during swallow;Reduced tongue base retraction;Reduced  airway/laryngeal closure;Reduced laryngeal elevation;Reduced  anterior laryngeal mobility;Reduced epiglottic inversion;Reduced  pharyngeal peristalsis;Trace aspiration;Penetration/Aspiration  after swallow Penetration/Aspiration details (thin straw): Material does not  enter airway;Material enters airway, remains ABOVE vocal cords  then ejected out Pharyngeal - Solids Pharyngeal - Puree: Pharyngeal residue - pyriform  sinuses;Pharyngeal residue - valleculae;Reduced tongue base  retraction;Reduced airway/laryngeal closure;Reduced laryngeal  elevation;Reduced anterior laryngeal mobility;Reduced epiglottic  inversion;Reduced pharyngeal peristalsis Penetration/Aspiration details (puree): Material does not enter  airway Pharyngeal - Mechanical Soft: Pharyngeal residue - pyriform  sinuses;Pharyngeal residue - valleculae;Reduced tongue base  retraction;Reduced airway/laryngeal closure;Reduced laryngeal  elevation;Reduced anterior laryngeal mobility;Reduced epiglottic  inversion;Reduced pharyngeal peristalsis Penetration/Aspiration details (mechanical soft): Material does  not enter airway  Cervical Esophageal Phase    GO    Cervical Esophageal Phase Cervical Esophageal Phase: Impaired Cervical Esophageal Phase - Comment Cervical Esophageal Comment: Appearance of mild protrusion of  cervical  spine at level of UES impedes complete passage to bolus  with moderate residuals pooling in pyriform sinuses post swallow        [name], MA CCC-SLP [phone]  [name] 05/01/2012, 11:35 AM     Dg Swallowing Func-speech Pathology  04/17/2012  Connor Small, CCC-SLP     04/17/2012 11:12 AM Objective Swallowing Evaluation: Modified Barium Swallowing Study   Patient Details  Name: Connor Small MRN: [id number] Date of Birth: 02-07-1960  Today's Date: 04/17/2012 Time: 0940-1010 SLP Time Calculation (min): 30 min  Past Medical History:  Past Medical History  Diagnosis Date  . Chronic diarrhea   . Rectal bleed   . Hemorrhoids   . Ulcerative colitis     Distal UC over 8 yrs ago diagnosed  . Diverticulitis of large intestine with perforation 10/2011    done at [place]   Past Surgical History:  Past Surgical History  Procedure Laterality Date  . Colonoscopy  9/03  . Sigmoidoscopy       06/13/2002  . Temporary ostomy November 06, 2010      for a colon perforation that was done in [facility] (Dr  Payton Doughty).    . Colonoscopy  02/16/2011    Procedure: COLONOSCOPY;  Surgeon: Rogene Houston, MD;   [facility];  Service: Endoscopy;  Laterality: N/A;   100  . Flexible sigmoidoscopy  02/27/2012    Procedure: FLEXIBLE SIGMOIDOSCOPY;  Surgeon: Rogene Houston,  MD;  [facility];  Service: Endoscopy;  Laterality:  N/A;  with colonic decompression  . Laparotomy  02/27/2012    Procedure: EXPLORATORY LAPAROTOMY;  Surgeon: Donato Heinz,  MD;  [facility];  Service: General;  Laterality: N/A;  . Cecostomy  02/27/2012    Procedure: CECOSTOMY;  Surgeon: Donato Heinz, MD;  [facility];  Service: General;  Laterality: N/A;  Cecostomy Tube  Placement  . Colectomy  02/28/2012    Procedure: TOTAL COLECTOMY;  Surgeon: Donato Heinz, MD;   [facility];  Service: General;  Laterality: N/A;  . Ileostomy  02/28/2012    Procedure: ILEOSTOMY;  Surgeon: Donato Heinz, MD;  [facility];  Service: General;  Laterality: N/A;  .  pharyngeal peristalsis;Trace aspiration;Penetration/Aspiration  after swallow Penetration/Aspiration details (thin cup): Material enters  airway, CONTACTS cords and not ejected out;Material enters  airway, passes BELOW cords without attempt by patient to eject  out (silent aspiration);Material does not enter airway Pharyngeal - Thin Straw: Pharyngeal residue - pyriform  sinuses;Pharyngeal residue - valleculae;Penetration/Aspiration  during swallow;Reduced tongue base retraction;Reduced  airway/laryngeal closure;Reduced laryngeal elevation;Reduced  anterior laryngeal mobility;Reduced epiglottic inversion;Reduced  pharyngeal peristalsis;Trace aspiration;Penetration/Aspiration  after swallow Penetration/Aspiration details (thin straw): Material does not  enter airway;Material enters airway, remains ABOVE vocal cords  then ejected out Pharyngeal - Solids Pharyngeal - Puree: Pharyngeal residue - pyriform  sinuses;Pharyngeal residue - valleculae;Reduced tongue base  retraction;Reduced airway/laryngeal closure;Reduced laryngeal  elevation;Reduced anterior laryngeal mobility;Reduced epiglottic  inversion;Reduced pharyngeal peristalsis Penetration/Aspiration details (puree): Material does not enter  airway Pharyngeal - Mechanical Soft: Pharyngeal residue - pyriform  sinuses;Pharyngeal residue - valleculae;Reduced tongue base  retraction;Reduced airway/laryngeal closure;Reduced laryngeal  elevation;Reduced anterior laryngeal mobility;Reduced epiglottic  inversion;Reduced pharyngeal peristalsis Penetration/Aspiration details (mechanical soft): Material does  not enter airway  Cervical Esophageal Phase    GO    Cervical Esophageal Phase Cervical Esophageal Phase: Impaired Cervical Esophageal Phase - Comment Cervical Esophageal Comment: Appearance of mild protrusion of  cervical  spine at level of UES impedes complete passage to bolus  with moderate residuals pooling in pyriform sinuses post swallow        [name], MA CCC-SLP [phone]  [name] 05/01/2012, 11:35 AM     Dg Swallowing Func-speech Pathology  04/17/2012  Connor Small, CCC-SLP     04/17/2012 11:12 AM Objective Swallowing Evaluation: Modified Barium Swallowing Study   Patient Details  Name: Connor Small MRN: [id number] Date of Birth: 02-07-1960  Today's Date: 04/17/2012 Time: 0940-1010 SLP Time Calculation (min): 30 min  Past Medical History:  Past Medical History  Diagnosis Date  . Chronic diarrhea   . Rectal bleed   . Hemorrhoids   . Ulcerative colitis     Distal UC over 8 yrs ago diagnosed  . Diverticulitis of large intestine with perforation 10/2011    done at [place]   Past Surgical History:  Past Surgical History  Procedure Laterality Date  . Colonoscopy  9/03  . Sigmoidoscopy       06/13/2002  . Temporary ostomy November 06, 2010      for a colon perforation that was done in [facility] (Dr  Payton Doughty).    . Colonoscopy  02/16/2011    Procedure: COLONOSCOPY;  Surgeon: Rogene Houston, MD;   [facility];  Service: Endoscopy;  Laterality: N/A;   100  . Flexible sigmoidoscopy  02/27/2012    Procedure: FLEXIBLE SIGMOIDOSCOPY;  Surgeon: Rogene Houston,  MD;  [facility];  Service: Endoscopy;  Laterality:  N/A;  with colonic decompression  . Laparotomy  02/27/2012    Procedure: EXPLORATORY LAPAROTOMY;  Surgeon: Donato Heinz,  MD;  [facility];  Service: General;  Laterality: N/A;  . Cecostomy  02/27/2012    Procedure: CECOSTOMY;  Surgeon: Donato Heinz, MD;  [facility]  [facility];  Service: General;  Laterality: N/A;  Cecostomy Tube  Placement  . Colectomy  02/28/2012    Procedure: TOTAL COLECTOMY;  Surgeon: Donato Heinz, MD;   [facility];  Service: General;  Laterality: N/A;  . Ileostomy  02/28/2012    Procedure: ILEOSTOMY;  Surgeon: Donato Heinz, MD;  [facility];  Service: General;  Laterality: N/A;  .  pharyngeal peristalsis;Trace aspiration;Penetration/Aspiration  after swallow Penetration/Aspiration details (thin cup): Material enters  airway, CONTACTS cords and not ejected out;Material enters  airway, passes BELOW cords without attempt by patient to eject  out (silent aspiration);Material does not enter airway Pharyngeal - Thin Straw: Pharyngeal residue - pyriform  sinuses;Pharyngeal residue - valleculae;Penetration/Aspiration  during swallow;Reduced tongue base retraction;Reduced  airway/laryngeal closure;Reduced laryngeal elevation;Reduced  anterior laryngeal mobility;Reduced epiglottic inversion;Reduced  pharyngeal peristalsis;Trace aspiration;Penetration/Aspiration  after swallow Penetration/Aspiration details (thin straw): Material does not  enter airway;Material enters airway, remains ABOVE vocal cords  then ejected out Pharyngeal - Solids Pharyngeal - Puree: Pharyngeal residue - pyriform  sinuses;Pharyngeal residue - valleculae;Reduced tongue base  retraction;Reduced airway/laryngeal closure;Reduced laryngeal  elevation;Reduced anterior laryngeal mobility;Reduced epiglottic  inversion;Reduced pharyngeal peristalsis Penetration/Aspiration details (puree): Material does not enter  airway Pharyngeal - Mechanical Soft: Pharyngeal residue - pyriform  sinuses;Pharyngeal residue - valleculae;Reduced tongue base  retraction;Reduced airway/laryngeal closure;Reduced laryngeal  elevation;Reduced anterior laryngeal mobility;Reduced epiglottic  inversion;Reduced pharyngeal peristalsis Penetration/Aspiration details (mechanical soft): Material does  not enter airway  Cervical Esophageal Phase    GO    Cervical Esophageal Phase Cervical Esophageal Phase: Impaired Cervical Esophageal Phase - Comment Cervical Esophageal Comment: Appearance of mild protrusion of  cervical  spine at level of UES impedes complete passage to bolus  with moderate residuals pooling in pyriform sinuses post swallow        [name], MA CCC-SLP [phone]  [name] 05/01/2012, 11:35 AM     Dg Swallowing Func-speech Pathology  04/17/2012  Connor Small, CCC-SLP     04/17/2012 11:12 AM Objective Swallowing Evaluation: Modified Barium Swallowing Study   Patient Details  Name: Connor Small MRN: [id number] Date of Birth: 02-07-1960  Today's Date: 04/17/2012 Time: 0940-1010 SLP Time Calculation (min): 30 min  Past Medical History:  Past Medical History  Diagnosis Date  . Chronic diarrhea   . Rectal bleed   . Hemorrhoids   . Ulcerative colitis     Distal UC over 8 yrs ago diagnosed  . Diverticulitis of large intestine with perforation 10/2011    done at [place]   Past Surgical History:  Past Surgical History  Procedure Laterality Date  . Colonoscopy  9/03  . Sigmoidoscopy       06/13/2002  . Temporary ostomy November 06, 2010      for a colon perforation that was done in [facility] (Dr  Payton Doughty).    . Colonoscopy  02/16/2011    Procedure: COLONOSCOPY;  Surgeon: Rogene Houston, MD;   [facility];  Service: Endoscopy;  Laterality: N/A;   100  . Flexible sigmoidoscopy  02/27/2012    Procedure: FLEXIBLE SIGMOIDOSCOPY;  Surgeon: Rogene Houston,  MD;  [facility];  Service: Endoscopy;  Laterality:  N/A;  with colonic decompression  . Laparotomy  02/27/2012    Procedure: EXPLORATORY LAPAROTOMY;  Surgeon: Donato Heinz,  MD;  [facility];  Service: General;  Laterality: N/A;  . Cecostomy  02/27/2012    Procedure: CECOSTOMY;  Surgeon: Donato Heinz, MD;  [facility];  Service: General;  Laterality: N/A;  Cecostomy Tube  Placement  . Colectomy  02/28/2012    Procedure: TOTAL COLECTOMY;  Surgeon: Donato Heinz, MD;   [facility]: [facility];  Service: General;  Laterality: N/A;  . Ileostomy  02/28/2012    Procedure: ILEOSTOMY;  Surgeon: Donato Heinz, MD;  [facility];  Service: General;  Laterality: N/A;  .  stomach.  Paucity of gas in the remainder the abdomen.   Original Report Authenticated By: Lucienne Capers, M.D.    Dg Swallowing Func-speech Pathology  05/01/2012  Connor Small, [place]     05/01/2012 11:37 AM Objective Swallowing Evaluation: Modified Barium Swallowing Study   Patient Details  Name: Connor Small MRN: [id number] Date of Birth: [birth year]  Today's Date: 05/01/2012 Time: 1030-1100 SLP Time Calculation (min): 30 min  Past Medical History:  Past Medical History  Diagnosis Date  . Chronic diarrhea   . Rectal bleed   . Hemorrhoids   . Ulcerative colitis     Distal UC over 8 yrs ago diagnosed  . Diverticulitis of large intestine with perforation 10/2011    done at [facility]  . 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 history of UC (ulcerative colitis) 12/06/2010   Past Surgical History:  Past Surgical History  Procedure Laterality Date  . Colonoscopy  9/03  . Sigmoidoscopy       06/13/2002  . Temporary ostomy November 06, 2010      for a colon perforation that was done in [facility] (Dr  Payton Doughty).    . Colonoscopy  02/16/2011    Procedure: COLONOSCOPY;  Surgeon: Rogene Houston, MD;   [facility];  Service: Endoscopy;   Laterality: N/A;   100  . Flexible sigmoidoscopy  02/27/2012    Procedure: FLEXIBLE SIGMOIDOSCOPY;  Surgeon: Rogene Houston,  MD;  [facility];  Service: Endoscopy;  Laterality:  N/A;  with colonic decompression  . Laparotomy  02/27/2012    Procedure: EXPLORATORY LAPAROTOMY;  Surgeon: Donato Heinz,  MD;  [facility];  Service: General;  Laterality: N/A;  . Cecostomy  02/27/2012    Procedure: CECOSTOMY;  Surgeon: Donato Heinz, MD;  [facility];  Service: General;  Laterality: N/A;  Cecostomy Tube  Placement  . Colectomy  02/28/2012    Procedure: TOTAL COLECTOMY;  Surgeon: Donato Heinz, MD;   [facility];  Service: General;  Laterality: N/A;  . Ileostomy  02/28/2012    Procedure: ILEOSTOMY;  Surgeon: Donato Heinz, MD;  [facility];  Service: General;  Laterality: N/A;  . Cystoscopy w/ ureteral stent placement Bilateral 03/20/2012    Procedure: CYSTOSCOPY WITH RETROGRADE PYELOGRAM/URETERAL STENT  PLACEMENT;  Surgeon: Alexis Frock, MD;  Location: [place];   Service: Urology;  Laterality: Bilateral;  . Laparotomy N/A 03/26/2012    Procedure: EXPLORATORY LAPAROTOMY;  Surgeon: Edward Jolly, MD;  Location: [place];  Service: General;  Laterality:  N/A;  . Application of wound vac N/A 03/26/2012    Procedure: APPLICATION OF WOUND VAC;  Surgeon: Edward Jolly, MD;  [facility];  Service: General;  Laterality:  N/A;  . Liver biopsy N/A 03/26/2012    Procedure: LIVER BIOPSY;  Surgeon: Edward Jolly, MD;   Location: [place];  Service: General;  Laterality: N/A;  . Laparotomy N/A 03/29/2012    Procedure: EXPLORATORY LAPAROTOMY, PARTIAL WOUND CLOSURE;   Surgeon: Edward Jolly, MD;  Location: [place];  Service:  General;  Laterality: N/A;  . Vacuum assisted closure change N/A 03/29/2012    Procedure: Open ABDOMINAL VACUUM  CHANGE;  Surgeon: Edward Jolly, MD;  Location: [place];  Service: General;  Laterality:  N/A;  . Vacuum assisted closure change N/A 04/01/2012    Procedure:  removal of abdominal vac dressing and abdominal  closure;  Surgeon: Marland Kitchen  Physician Discharge Summary  ADILSON GRAFTON BSJ:[id number] DOB: Jun 09, 1960 DOA: 04/25/2012  PCP: No primary provider on file.  Admit date: 04/25/2012 Discharge date: 05/09/2012  Time spent: >30 minutes  Recommendations for Outpatient Follow-up:  Follow CBC in 3-5 days Encourage PO intake Follow schedule medications and avoid given then all at once (ask pharmacy for assistance if needed) Follow speech rec's regarding best position and instructions to decrease aspiration Needs telemetry bed to continue assessing heart rate and rhythm; if patient develops atrial fibrillation he will need to be started on eliquis.  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 Will be discharge to CIR.  Diet recommendation: regular diet and tube feeding; RD will continue following patient.  Filed Weights   05/02/12 0557 05/06/12 0556 05/07/12 0542  Weight: 68.6 kg (151 lb 3.8 oz) 65.1 kg (143 lb 8.3 oz) 66.2 kg (145 lb 15.1 oz)    Brief admission history:  52 year old male patient with past medical history of ulcerative colitis and prior diverticular perforation. He was admitted to [facility] on 02/26/2012 with C. difficile colitis. He initially underwent exploratory laparotomy for a cecostomy tube placement by Dr. Bernette Mayers on 02/27/2012 because of colonic stricture. 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 Because of septic shock with acute renal failure and Atrial fib with RVR he was transferred to [facility] 03/01/2012. He required acute dialysis/CVVHD and was followed by pulmonary critical care medicine for his sepsis and shock that required pressors. CCS was consulted in regards to the surgical problems. He developed wound evisceration and underwent multiple procedures with eventual closure and placement of retention sutures on 04/01/2012 by Dr. Excell Seltzer. 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  There was also question of heparin induced thrombocytopenia. He was transitioned to Argatroban and developed a GI bleed on 04/02/2012. He had persistent lower extremity edema but dopplers were negative for DVT.  Because of diffuse weakness, especially right upper extremity weakness, neurology was consulted. 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 Dr. Leonie Man recommended aspirin initially with transition to full anticoagulation once thrombocytopenia and transaminitis resolved. Because of patient's difficulty swallowing and tolerating enteral feeds initiation of oral medications including anticoagulation has been limited. His feeding tube was initially discontinued on 04/16/2012 and his diet was advanced to dysphagia 3. 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 PT and OT recommended patient proceed with inpatient rehabilitation and he was admitted to that unit on 04/17/2012.  After admission to the rehabilitation unit 04/21/2012 the patient developed nausea & vomiting and chest x-ray revealed a  right lower lobe pneumonia likely  pharyngeal peristalsis;Trace aspiration;Penetration/Aspiration  after swallow Penetration/Aspiration details (thin cup): Material enters  airway, CONTACTS cords and not ejected out;Material enters  airway, passes BELOW cords without attempt by patient to eject  out (silent aspiration);Material does not enter airway Pharyngeal - Thin Straw: Pharyngeal residue - pyriform  sinuses;Pharyngeal residue - valleculae;Penetration/Aspiration  during swallow;Reduced tongue base retraction;Reduced  airway/laryngeal closure;Reduced laryngeal elevation;Reduced  anterior laryngeal mobility;Reduced epiglottic inversion;Reduced  pharyngeal peristalsis;Trace aspiration;Penetration/Aspiration  after swallow Penetration/Aspiration details (thin straw): Material does not  enter airway;Material enters airway, remains ABOVE vocal cords  then ejected out Pharyngeal - Solids Pharyngeal - Puree: Pharyngeal residue - pyriform  sinuses;Pharyngeal residue - valleculae;Reduced tongue base  retraction;Reduced airway/laryngeal closure;Reduced laryngeal  elevation;Reduced anterior laryngeal mobility;Reduced epiglottic  inversion;Reduced pharyngeal peristalsis Penetration/Aspiration details (puree): Material does not enter  airway Pharyngeal - Mechanical Soft: Pharyngeal residue - pyriform  sinuses;Pharyngeal residue - valleculae;Reduced tongue base  retraction;Reduced airway/laryngeal closure;Reduced laryngeal  elevation;Reduced anterior laryngeal mobility;Reduced epiglottic  inversion;Reduced pharyngeal peristalsis Penetration/Aspiration details (mechanical soft): Material does  not enter airway  Cervical Esophageal Phase    GO    Cervical Esophageal Phase Cervical Esophageal Phase: Impaired Cervical Esophageal Phase - Comment Cervical Esophageal Comment: Appearance of mild protrusion of  cervical  spine at level of UES impedes complete passage to bolus  with moderate residuals pooling in pyriform sinuses post swallow        [name], MA CCC-SLP [phone]  [name] 05/01/2012, 11:35 AM     Dg Swallowing Func-speech Pathology  04/17/2012  Connor Small, CCC-SLP     04/17/2012 11:12 AM Objective Swallowing Evaluation: Modified Barium Swallowing Study   Patient Details  Name: Connor Small MRN: [id number] Date of Birth: 02-07-1960  Today's Date: 04/17/2012 Time: 0940-1010 SLP Time Calculation (min): 30 min  Past Medical History:  Past Medical History  Diagnosis Date  . Chronic diarrhea   . Rectal bleed   . Hemorrhoids   . Ulcerative colitis     Distal UC over 8 yrs ago diagnosed  . Diverticulitis of large intestine with perforation 10/2011    done at [place]   Past Surgical History:  Past Surgical History  Procedure Laterality Date  . Colonoscopy  9/03  . Sigmoidoscopy       06/13/2002  . Temporary ostomy November 06, 2010      for a colon perforation that was done in [facility] (Dr  Payton Doughty).    . Colonoscopy  02/16/2011    Procedure: COLONOSCOPY;  Surgeon: Rogene Houston, MD;   [facility];  Service: Endoscopy;  Laterality: N/A;   100  . Flexible sigmoidoscopy  02/27/2012    Procedure: FLEXIBLE SIGMOIDOSCOPY;  Surgeon: Rogene Houston,  MD;  [facility];  Service: Endoscopy;  Laterality:  N/A;  with colonic decompression  . Laparotomy  02/27/2012    Procedure: EXPLORATORY LAPAROTOMY;  Surgeon: Donato Heinz,  MD;  [facility];  Service: General;  Laterality: N/A;  . Cecostomy  02/27/2012    Procedure: CECOSTOMY;  Surgeon: Donato Heinz, MD;  [facility];  Service: General;  Laterality: N/A;  Cecostomy Tube  Placement  . Colectomy  02/28/2012    Procedure: TOTAL COLECTOMY;  Surgeon: Donato Heinz, MD;   [facility];  Service: General;  Laterality: N/A;  . Ileostomy  02/28/2012    Procedure: ILEOSTOMY;  Surgeon: Donato Heinz, MD;  [facility]  [facility];  Service: General;  Laterality: N/A;  .  Physician Discharge Summary  ADILSON GRAFTON BSJ:[id number] DOB: Jun 09, 1960 DOA: 04/25/2012  PCP: No primary provider on file.  Admit date: 04/25/2012 Discharge date: 05/09/2012  Time spent: >30 minutes  Recommendations for Outpatient Follow-up:  Follow CBC in 3-5 days Encourage PO intake Follow schedule medications and avoid given then all at once (ask pharmacy for assistance if needed) Follow speech rec's regarding best position and instructions to decrease aspiration Needs telemetry bed to continue assessing heart rate and rhythm; if patient develops atrial fibrillation he will need to be started on eliquis.  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 Will be discharge to CIR.  Diet recommendation: regular diet and tube feeding; RD will continue following patient.  Filed Weights   05/02/12 0557 05/06/12 0556 05/07/12 0542  Weight: 68.6 kg (151 lb 3.8 oz) 65.1 kg (143 lb 8.3 oz) 66.2 kg (145 lb 15.1 oz)    Brief admission history:  52 year old male patient with past medical history of ulcerative colitis and prior diverticular perforation. He was admitted to [facility] on 02/26/2012 with C. difficile colitis. He initially underwent exploratory laparotomy for a cecostomy tube placement by Dr. Bernette Mayers on 02/27/2012 because of colonic stricture. 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 Because of septic shock with acute renal failure and Atrial fib with RVR he was transferred to [facility] 03/01/2012. He required acute dialysis/CVVHD and was followed by pulmonary critical care medicine for his sepsis and shock that required pressors. CCS was consulted in regards to the surgical problems. He developed wound evisceration and underwent multiple procedures with eventual closure and placement of retention sutures on 04/01/2012 by Dr. Excell Seltzer. 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  There was also question of heparin induced thrombocytopenia. He was transitioned to Argatroban and developed a GI bleed on 04/02/2012. He had persistent lower extremity edema but dopplers were negative for DVT.  Because of diffuse weakness, especially right upper extremity weakness, neurology was consulted. 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 Dr. Leonie Man recommended aspirin initially with transition to full anticoagulation once thrombocytopenia and transaminitis resolved. Because of patient's difficulty swallowing and tolerating enteral feeds initiation of oral medications including anticoagulation has been limited. His feeding tube was initially discontinued on 04/16/2012 and his diet was advanced to dysphagia 3. 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 PT and OT recommended patient proceed with inpatient rehabilitation and he was admitted to that unit on 04/17/2012.  After admission to the rehabilitation unit 04/21/2012 the patient developed nausea & vomiting and chest x-ray revealed a  right lower lobe pneumonia likely  stomach.  Paucity of gas in the remainder the abdomen.   Original Report Authenticated By: Lucienne Capers, M.D.    Dg Swallowing Func-speech Pathology  05/01/2012  Connor Small, [place]     05/01/2012 11:37 AM Objective Swallowing Evaluation: Modified Barium Swallowing Study   Patient Details  Name: Connor Small MRN: [id number] Date of Birth: [birth year]  Today's Date: 05/01/2012 Time: 1030-1100 SLP Time Calculation (min): 30 min  Past Medical History:  Past Medical History  Diagnosis Date  . Chronic diarrhea   . Rectal bleed   . Hemorrhoids   . Ulcerative colitis     Distal UC over 8 yrs ago diagnosed  . Diverticulitis of large intestine with perforation 10/2011    done at [place]  . 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 history of UC (ulcerative colitis) 12/06/2010   Past Surgical History:  Past Surgical History  Procedure Laterality Date  . Colonoscopy  9/03  . Sigmoidoscopy       06/13/2002  . Temporary ostomy November 06, 2010      for a colon perforation that was done in [facility] (Dr  Payton Doughty).    . Colonoscopy  02/16/2011    Procedure: COLONOSCOPY;  Surgeon: Rogene Houston, MD;   [facility];  Service: Endoscopy;   Laterality: N/A;   100  . Flexible sigmoidoscopy  02/27/2012    Procedure: FLEXIBLE SIGMOIDOSCOPY;  Surgeon: Rogene Houston,  MD;  [facility];  Service: Endoscopy;  Laterality:  N/A;  with colonic decompression  . Laparotomy  02/27/2012    Procedure: EXPLORATORY LAPAROTOMY;  Surgeon: Donato Heinz,  MD;  [facility];  Service: General;  Laterality: N/A;  . Cecostomy  02/27/2012    Procedure: CECOSTOMY;  Surgeon: Donato Heinz, MD;  [facility];  Service: General;  Laterality: N/A;  Cecostomy Tube  Placement  . Colectomy  02/28/2012    Procedure: TOTAL COLECTOMY;  Surgeon: Donato Heinz, MD;   [facility];  Service: General;  Laterality: N/A;  . Ileostomy  02/28/2012    Procedure: ILEOSTOMY;  Surgeon: Donato Heinz, MD;  [facility];  Service: General;  Laterality: N/A;  . Cystoscopy w/ ureteral stent placement Bilateral 03/20/2012    Procedure: CYSTOSCOPY WITH RETROGRADE PYELOGRAM/URETERAL STENT  PLACEMENT;  Surgeon: Alexis Frock, MD;  Location: [place];   Service: Urology;  Laterality: Bilateral;  . Laparotomy N/A 03/26/2012    Procedure: EXPLORATORY LAPAROTOMY;  Surgeon: Edward Jolly, MD;  Location: [place];  Service: General;  Laterality:  N/A;  . Application of wound vac N/A 03/26/2012    Procedure: APPLICATION OF WOUND VAC;  Surgeon: Edward Jolly, MD;  [facility];  Service: General;  Laterality:  N/A;  . Liver biopsy N/A 03/26/2012    Procedure: LIVER BIOPSY;  Surgeon: Edward Jolly, MD;   Location: [place];  Service: General;  Laterality: N/A;  . Laparotomy N/A 03/29/2012    Procedure: EXPLORATORY LAPAROTOMY, PARTIAL WOUND CLOSURE;   Surgeon: Edward Jolly, MD;  Location: [place];  Service:  General;  Laterality: N/A;  . Vacuum assisted closure change N/A 03/29/2012    Procedure: Open ABDOMINAL VACUUM  CHANGE;  Surgeon: Edward Jolly, MD;  Location: [place];  Service: General;  Laterality:  N/A;  . Vacuum assisted closure change N/A 04/01/2012    Procedure:  removal of abdominal vac dressing and abdominal  closure;  Surgeon: Marland Kitchen  pharyngeal peristalsis;Trace aspiration;Penetration/Aspiration  after swallow Penetration/Aspiration details (thin cup): Material enters  airway, CONTACTS cords and not ejected out;Material enters  airway, passes BELOW cords without attempt by patient to eject  out (silent aspiration);Material does not enter airway Pharyngeal - Thin Straw: Pharyngeal residue - pyriform  sinuses;Pharyngeal residue - valleculae;Penetration/Aspiration  during swallow;Reduced tongue base retraction;Reduced  airway/laryngeal closure;Reduced laryngeal elevation;Reduced  anterior laryngeal mobility;Reduced epiglottic inversion;Reduced  pharyngeal peristalsis;Trace aspiration;Penetration/Aspiration  after swallow Penetration/Aspiration details (thin straw): Material does not  enter airway;Material enters airway, remains ABOVE vocal cords  then ejected out Pharyngeal - Solids Pharyngeal - Puree: Pharyngeal residue - pyriform  sinuses;Pharyngeal residue - valleculae;Reduced tongue base  retraction;Reduced airway/laryngeal closure;Reduced laryngeal  elevation;Reduced anterior laryngeal mobility;Reduced epiglottic  inversion;Reduced pharyngeal peristalsis Penetration/Aspiration details (puree): Material does not enter  airway Pharyngeal - Mechanical Soft: Pharyngeal residue - pyriform  sinuses;Pharyngeal residue - valleculae;Reduced tongue base  retraction;Reduced airway/laryngeal closure;Reduced laryngeal  elevation;Reduced anterior laryngeal mobility;Reduced epiglottic  inversion;Reduced pharyngeal peristalsis Penetration/Aspiration details (mechanical soft): Material does  not enter airway  Cervical Esophageal Phase    GO    Cervical Esophageal Phase Cervical Esophageal Phase: Impaired Cervical Esophageal Phase - Comment Cervical Esophageal Comment: Appearance of mild protrusion of  cervical  spine at level of UES impedes complete passage to bolus  with moderate residuals pooling in pyriform sinuses post swallow        [name], MA CCC-SLP [phone]  [name] 05/01/2012, 11:35 AM     Dg Swallowing Func-speech Pathology  04/17/2012  Connor Small, CCC-SLP     04/17/2012 11:12 AM Objective Swallowing Evaluation: Modified Barium Swallowing Study   Patient Details  Name: Connor Small MRN: [id number] Date of Birth: 02-07-1960  Today's Date: 04/17/2012 Time: 0940-1010 SLP Time Calculation (min): 30 min  Past Medical History:  Past Medical History  Diagnosis Date  . Chronic diarrhea   . Rectal bleed   . Hemorrhoids   . Ulcerative colitis     Distal UC over 8 yrs ago diagnosed  . Diverticulitis of large intestine with perforation 10/2011    done at [place]   Past Surgical History:  Past Surgical History  Procedure Laterality Date  . Colonoscopy  9/03  . Sigmoidoscopy       06/13/2002  . Temporary ostomy November 06, 2010      for a colon perforation that was done in [facility] (Dr  Payton Doughty).    . Colonoscopy  02/16/2011    Procedure: COLONOSCOPY;  Surgeon: Rogene Houston, MD;   [facility];  Service: Endoscopy;  Laterality: N/A;   100  . Flexible sigmoidoscopy  02/27/2012    Procedure: FLEXIBLE SIGMOIDOSCOPY;  Surgeon: Rogene Houston,  MD;  [facility];  Service: Endoscopy;  Laterality:  N/A;  with colonic decompression  . Laparotomy  02/27/2012    Procedure: EXPLORATORY LAPAROTOMY;  Surgeon: Donato Heinz,  MD;  [facility];  Service: General;  Laterality: N/A;  . Cecostomy  02/27/2012    Procedure: CECOSTOMY;  Surgeon: Donato Heinz, MD;  [facility];  Service: General;  Laterality: N/A;  Cecostomy Tube  Placement  . Colectomy  02/28/2012    Procedure: TOTAL COLECTOMY;  Surgeon: Donato Heinz, MD;   [facility];  Service: General;  Laterality: N/A;  . Ileostomy  02/28/2012    Procedure: ILEOSTOMY;  Surgeon: Donato Heinz, MD;  [facility];  Service: General;  Laterality: N/A;  .  pharyngeal peristalsis;Trace aspiration;Penetration/Aspiration  after swallow Penetration/Aspiration details (thin cup): Material enters  airway, CONTACTS cords and not ejected out;Material enters  airway, passes BELOW cords without attempt by patient to eject  out (silent aspiration);Material does not enter airway Pharyngeal - Thin Straw: Pharyngeal residue - pyriform  sinuses;Pharyngeal residue - valleculae;Penetration/Aspiration  during swallow;Reduced tongue base retraction;Reduced  airway/laryngeal closure;Reduced laryngeal elevation;Reduced  anterior laryngeal mobility;Reduced epiglottic inversion;Reduced  pharyngeal peristalsis;Trace aspiration;Penetration/Aspiration  after swallow Penetration/Aspiration details (thin straw): Material does not  enter airway;Material enters airway, remains ABOVE vocal cords  then ejected out Pharyngeal - Solids Pharyngeal - Puree: Pharyngeal residue - pyriform  sinuses;Pharyngeal residue - valleculae;Reduced tongue base  retraction;Reduced airway/laryngeal closure;Reduced laryngeal  elevation;Reduced anterior laryngeal mobility;Reduced epiglottic  inversion;Reduced pharyngeal peristalsis Penetration/Aspiration details (puree): Material does not enter  airway Pharyngeal - Mechanical Soft: Pharyngeal residue - pyriform  sinuses;Pharyngeal residue - valleculae;Reduced tongue base  retraction;Reduced airway/laryngeal closure;Reduced laryngeal  elevation;Reduced anterior laryngeal mobility;Reduced epiglottic  inversion;Reduced pharyngeal peristalsis Penetration/Aspiration details (mechanical soft): Material does  not enter airway  Cervical Esophageal Phase    GO    Cervical Esophageal Phase Cervical Esophageal Phase: Impaired Cervical Esophageal Phase - Comment Cervical Esophageal Comment: Appearance of mild protrusion of  cervical  spine at level of UES impedes complete passage to bolus  with moderate residuals pooling in pyriform sinuses post swallow        [name], MA CCC-SLP [phone]  [name] 05/01/2012, 11:35 AM     Dg Swallowing Func-speech Pathology  04/17/2012  Connor Small, CCC-SLP     04/17/2012 11:12 AM Objective Swallowing Evaluation: Modified Barium Swallowing Study   Patient Details  Name: Connor Small MRN: [id number] Date of Birth: 02-07-1960  Today's Date: 04/17/2012 Time: 0940-1010 SLP Time Calculation (min): 30 min  Past Medical History:  Past Medical History  Diagnosis Date  . Chronic diarrhea   . Rectal bleed   . Hemorrhoids   . Ulcerative colitis     Distal UC over 8 yrs ago diagnosed  . Diverticulitis of large intestine with perforation 10/2011    done at [place]   Past Surgical History:  Past Surgical History  Procedure Laterality Date  . Colonoscopy  9/03  . Sigmoidoscopy       06/13/2002  . Temporary ostomy November 06, 2010      for a colon perforation that was done in [facility] (Dr  Payton Doughty).    . Colonoscopy  02/16/2011    Procedure: COLONOSCOPY;  Surgeon: Rogene Houston, MD;   [facility];  Service: Endoscopy;  Laterality: N/A;   100  . Flexible sigmoidoscopy  02/27/2012    Procedure: FLEXIBLE SIGMOIDOSCOPY;  Surgeon: Rogene Houston,  MD;  [facility];  Service: Endoscopy;  Laterality:  N/A;  with colonic decompression  . Laparotomy  02/27/2012    Procedure: EXPLORATORY LAPAROTOMY;  Surgeon: Donato Heinz,  MD;  [facility];  Service: General;  Laterality: N/A;  . Cecostomy  02/27/2012    Procedure: CECOSTOMY;  Surgeon: Donato Heinz, MD;  [facility];  Service: General;  Laterality: N/A;  Cecostomy Tube  Placement  . Colectomy  02/28/2012    Procedure: TOTAL COLECTOMY;  Surgeon: Donato Heinz, MD;   [facility];  Service: General;  Laterality: N/A;  . Ileostomy  02/28/2012    Procedure: ILEOSTOMY;  Surgeon: Donato Heinz, MD;  [facility];  Service: General;  Laterality: N/A;  .  stomach.  Paucity of gas in the remainder the abdomen.   Original Report Authenticated By: Lucienne Capers, M.D.    Dg Swallowing Func-speech Pathology  05/01/2012  Connor Small, [place]     05/01/2012 11:37 AM Objective Swallowing Evaluation: Modified Barium Swallowing Study   Patient Details  Name: Connor Small MRN: [id number] Date of Birth: [birth year]  Today's Date: 05/01/2012 Time: 1030-1100 SLP Time Calculation (min): 30 min  Past Medical History:  Past Medical History  Diagnosis Date  . Chronic diarrhea   . Rectal bleed   . Hemorrhoids   . Ulcerative colitis     Distal UC over 8 yrs ago diagnosed  . Diverticulitis of large intestine with perforation 10/2011    done at [facility]  . 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 history of UC (ulcerative colitis) 12/06/2010   Past Surgical History:  Past Surgical History  Procedure Laterality Date  . Colonoscopy  9/03  . Sigmoidoscopy       06/13/2002  . Temporary ostomy November 06, 2010      for a colon perforation that was done in [facility] (Dr  Payton Doughty).    . Colonoscopy  02/16/2011    Procedure: COLONOSCOPY;  Surgeon: Rogene Houston, MD;   [facility];  Service: Endoscopy;   Laterality: N/A;   100  . Flexible sigmoidoscopy  02/27/2012    Procedure: FLEXIBLE SIGMOIDOSCOPY;  Surgeon: Rogene Houston,  MD;  [facility];  Service: Endoscopy;  Laterality:  N/A;  with colonic decompression  . Laparotomy  02/27/2012    Procedure: EXPLORATORY LAPAROTOMY;  Surgeon: Donato Heinz,  MD;  [facility];  Service: General;  Laterality: N/A;  . Cecostomy  02/27/2012    Procedure: CECOSTOMY;  Surgeon: Donato Heinz, MD;  [facility];  Service: General;  Laterality: N/A;  Cecostomy Tube  Placement  . Colectomy  02/28/2012    Procedure: TOTAL COLECTOMY;  Surgeon: Donato Heinz, MD;   [facility];  Service: General;  Laterality: N/A;  . Ileostomy  02/28/2012    Procedure: ILEOSTOMY;  Surgeon: Donato Heinz, MD;  [facility];  Service: General;  Laterality: N/A;  . Cystoscopy w/ ureteral stent placement Bilateral 03/20/2012    Procedure: CYSTOSCOPY WITH RETROGRADE PYELOGRAM/URETERAL STENT  PLACEMENT;  Surgeon: Alexis Frock, MD;  Location: [place];   Service: Urology;  Laterality: Bilateral;  . Laparotomy N/A 03/26/2012    Procedure: EXPLORATORY LAPAROTOMY;  Surgeon: Edward Jolly, MD;  Location: [place];  Service: General;  Laterality:  N/A;  . Application of wound vac N/A 03/26/2012    Procedure: APPLICATION OF WOUND VAC;  Surgeon: Edward Jolly, MD;  [facility];  Service: General;  Laterality:  N/A;  . Liver biopsy N/A 03/26/2012    Procedure: LIVER BIOPSY;  Surgeon: Edward Jolly, MD;   Location: [place];  Service: General;  Laterality: N/A;  . Laparotomy N/A 03/29/2012    Procedure: EXPLORATORY LAPAROTOMY, PARTIAL WOUND CLOSURE;   Surgeon: Edward Jolly, MD;  Location: [place];  Service:  General;  Laterality: N/A;  . Vacuum assisted closure change N/A 03/29/2012    Procedure: Open ABDOMINAL VACUUM  CHANGE;  Surgeon: Edward Jolly, MD;  Location: [place];  Service: General;  Laterality:  N/A;  . Vacuum assisted closure change N/A 04/01/2012    Procedure:  removal of abdominal vac dressing and abdominal  closure;  Surgeon: Marland Kitchen  Physician Discharge Summary  ADILSON GRAFTON BSJ:[id number] DOB: Jun 09, 1960 DOA: 04/25/2012  PCP: No primary provider on file.  Admit date: 04/25/2012 Discharge date: 05/09/2012  Time spent: >30 minutes  Recommendations for Outpatient Follow-up:  Follow CBC in 3-5 days Encourage PO intake Follow schedule medications and avoid given then all at once (ask pharmacy for assistance if needed) Follow speech rec's regarding best position and instructions to decrease aspiration Needs telemetry bed to continue assessing heart rate and rhythm; if patient develops atrial fibrillation he will need to be started on eliquis.  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 Will be discharge to CIR.  Diet recommendation: regular diet and tube feeding; RD will continue following patient.  Filed Weights   05/02/12 0557 05/06/12 0556 05/07/12 0542  Weight: 68.6 kg (151 lb 3.8 oz) 65.1 kg (143 lb 8.3 oz) 66.2 kg (145 lb 15.1 oz)    Brief admission history:  52 year old male patient with past medical history of ulcerative colitis and prior diverticular perforation. He was admitted to [facility] on 02/26/2012 with C. difficile colitis. He initially underwent exploratory laparotomy for a cecostomy tube placement by Dr. Bernette Mayers on 02/27/2012 because of colonic stricture. 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 Because of septic shock with acute renal failure and Atrial fib with RVR he was transferred to [facility] 03/01/2012. He required acute dialysis/CVVHD and was followed by pulmonary critical care medicine for his sepsis and shock that required pressors. CCS was consulted in regards to the surgical problems. He developed wound evisceration and underwent multiple procedures with eventual closure and placement of retention sutures on 04/01/2012 by Dr. Excell Seltzer. 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  There was also question of heparin induced thrombocytopenia. He was transitioned to Argatroban and developed a GI bleed on 04/02/2012. He had persistent lower extremity edema but dopplers were negative for DVT.  Because of diffuse weakness, especially right upper extremity weakness, neurology was consulted. 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 Dr. Leonie Man recommended aspirin initially with transition to full anticoagulation once thrombocytopenia and transaminitis resolved. Because of patient's difficulty swallowing and tolerating enteral feeds initiation of oral medications including anticoagulation has been limited. His feeding tube was initially discontinued on 04/16/2012 and his diet was advanced to dysphagia 3. 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 PT and OT recommended patient proceed with inpatient rehabilitation and he was admitted to that unit on 04/17/2012.  After admission to the rehabilitation unit 04/21/2012 the patient developed nausea & vomiting and chest x-ray revealed a  right lower lobe pneumonia likely

## 2012-05-09 NOTE — Progress Notes (Signed)
Transferred to [facility] rehab at this time. Family at bedside

## 2012-05-09 NOTE — Progress Notes (Signed)
MEDICATION RELATED CONSULT NOTE - F/U   Pharmacy Consult for Aranesp Indication: Anemia of Chronic Illness  Allergies  Allergen Reactions  . Penicillins Other (See Comments)    Heart rate changes  . Morphine And Related     Nausea/vomiting  . Oxycodone     Nausea/vomiting  . Imipenem Rash    Patient Measurements: Height: 5' 10"  (177.8 cm) Weight: 147 lb 4.3 oz (66.8 kg) IBW/kg (Calculated) : 73  Vital Signs: Temp: 98.3 F (36.8 C) (04/16 1410) Temp src: Oral (04/16 1410) BP: 101/66 mmHg (04/16 1845) Pulse Rate: 108 (04/16 1845) Intake/Output from previous day:   Intake/Output from this shift:    Labs:  Recent Labs  05/07/12 0900 05/08/12 0630 05/09/12 0635  WBC  --  13.7*  --   HGB 6.5* 9.5* 10.3*  HCT 20.0* 27.5* 30.8*  PLT  --  404*  --   CREATININE 0.73 0.78  --   MG  --   --  1.5  PHOS  --   --  3.9   Estimated Creatinine Clearance: 99.7 ml/min (by C-G formula based on Cr of 0.78).    Medications:  Scheduled:  . [START ON 05/10/2012] aspirin  81 mg Per Tube Daily  . chlorhexidine  15 mL Mouth Rinse BID  . [START ON 05/15/2012] darbepoetin (ARANESP) injection - NON-DIALYSIS  40 mcg Subcutaneous Q Tue-1800  . [START ON 05/10/2012] ferrous sulfate  300 mg Per Tube TID WC  . free water  200 mL Per Tube Q3H  . insulin aspart  0-5 Units Subcutaneous QHS  . [START ON 05/10/2012] insulin aspart  0-9 Units Subcutaneous TID WC  . [START ON 05/10/2012] lidocaine  1 patch Transdermal Q24H  . lip balm   Topical BID  . loperamide  2 mg Per Tube QHS  . metoCLOPramide  5 mg Oral TID  . [START ON 05/10/2012] metoprolol tartrate  6.25 mg Per Tube Daily  . [START ON 05/10/2012] multivitamin  5 mL Per Tube Daily  . [START ON 05/10/2012] pantoprazole sodium  40 mg Per Tube Q1200  . potassium chloride  20 mEq Oral BID  . saccharomyces boulardii  250 mg Oral BID    Assessment: 52 year old male who has been hospitalized since 02/26/12.  His hemoglobin is 10.3, and Aranesp is  requested by [facility].  He is currently receiving Ferrous Sulfate po TID. Iron studies on 4/5 showed a low iron and TSAT <20%.  Last retic count was 43.8 (RBC= 2.19) on 4/14, lower than the prior value of 61.7 (RBC= 2.57)  Aranesp was started on 04/28/12  Goal of Therapy:  Hgb increase of at least 1 gm/dL after 4 weeks of Aranesp  Plan:  -Could consider IV iron to replace iron stores -Continue aranesp 71mg qweek on Tuesdays -Will continue weekly CBC and retic count monitoring on Tuesdays  AHildred Laser Pharm D 05/09/2012 7:39 PM

## 2012-05-10 ENCOUNTER — Inpatient Hospital Stay (HOSPITAL_COMMUNITY): Payer: BC Managed Care – PPO | Admitting: Speech Pathology

## 2012-05-10 ENCOUNTER — Inpatient Hospital Stay (HOSPITAL_COMMUNITY): Payer: BC Managed Care – PPO | Admitting: Occupational Therapy

## 2012-05-10 ENCOUNTER — Inpatient Hospital Stay (HOSPITAL_COMMUNITY): Payer: BC Managed Care – PPO

## 2012-05-10 DIAGNOSIS — G8250 Quadriplegia, unspecified: Secondary | ICD-10-CM

## 2012-05-10 DIAGNOSIS — G7281 Critical illness myopathy: Secondary | ICD-10-CM

## 2012-05-10 LAB — CBC WITH DIFFERENTIAL/PLATELET
Eosinophils Absolute: 0.3 10*3/uL (ref 0.0–0.7)
Eosinophils Relative: 2 % (ref 0–5)
HCT: 28.6 % — ABNORMAL LOW (ref 39.0–52.0)
Hemoglobin: 9.6 g/dL — ABNORMAL LOW (ref 13.0–17.0)
Lymphocytes Relative: 8 % — ABNORMAL LOW (ref 12–46)
Lymphs Abs: 1.1 10*3/uL (ref 0.7–4.0)
MCH: 29.5 pg (ref 26.0–34.0)
MCV: 88 fL (ref 78.0–100.0)
Monocytes Absolute: 0.7 10*3/uL (ref 0.1–1.0)
Monocytes Relative: 5 % (ref 3–12)
Platelets: ADEQUATE 10*3/uL (ref 150–400)
RBC: 3.25 MIL/uL — ABNORMAL LOW (ref 4.22–5.81)
WBC: 14.1 10*3/uL — ABNORMAL HIGH (ref 4.0–10.5)

## 2012-05-10 LAB — COMPREHENSIVE METABOLIC PANEL
ALT: 16 U/L (ref 0–53)
BUN: 21 mg/dL (ref 6–23)
CO2: 25 mEq/L (ref 19–32)
Calcium: 8.5 mg/dL (ref 8.4–10.5)
Creatinine, Ser: 0.69 mg/dL (ref 0.50–1.35)
GFR calc Af Amer: >90 mL/min (ref >90)
GFR calc non Af Amer: >90 mL/min (ref >90)
Glucose, Bld: 140 mg/dL — ABNORMAL HIGH (ref 70–99)
Sodium: 136 mEq/L (ref 135–145)
Total Protein: 5.6 g/dL — ABNORMAL LOW (ref 6.0–8.3)

## 2012-05-10 LAB — GLUCOSE, CAPILLARY
Glucose-Capillary: 115 mg/dL — ABNORMAL HIGH (ref 70–99)
Glucose-Capillary: 124 mg/dL — ABNORMAL HIGH (ref 70–99)
Glucose-Capillary: 116 mg/dL — ABNORMAL HIGH (ref 70–99)

## 2012-05-10 MED ORDER — LOPERAMIDE HCL 1 MG/5ML PO LIQD
2.0000 mg | ORAL | Status: DC
  Administered 2012-05-20 – 2012-05-31 (×4): 2 mg
  Filled 2012-05-10: qty 20

## 2012-05-10 MED ORDER — METOCLOPRAMIDE HCL 5 MG/5ML PO SOLN
10.0000 mg | Freq: Three times a day (TID) | ORAL | Status: DC
  Administered 2012-05-10 – 2012-05-30 (×60): 10 mg via ORAL
  Filled 2012-05-10 (×67): qty 10

## 2012-05-10 MED ORDER — CLONAZEPAM 0.5 MG PO TABS
0.5000 mg | ORAL_TABLET | Freq: Every day | ORAL | Status: DC
  Administered 2012-05-10 – 2012-05-22 (×13): 0.5 mg via ORAL
  Filled 2012-05-10 (×13): qty 1

## 2012-05-10 MED ORDER — PROCHLORPERAZINE EDISYLATE 5 MG/ML IJ SOLN
10.0000 mg | Freq: Four times a day (QID) | INTRAMUSCULAR | Status: DC | PRN
  Filled 2012-05-10: qty 2

## 2012-05-10 MED ORDER — PROCHLORPERAZINE MALEATE 10 MG PO TABS
10.0000 mg | ORAL_TABLET | Freq: Four times a day (QID) | ORAL | Status: DC | PRN
  Administered 2012-05-10 – 2012-05-20 (×3): 10 mg via ORAL
  Filled 2012-05-10 (×5): qty 1

## 2012-05-10 MED ORDER — LOPERAMIDE HCL 1 MG/5ML PO LIQD
2.0000 mg | Freq: Every day | ORAL | Status: DC
  Administered 2012-05-10 – 2012-05-30 (×21): 2 mg
  Filled 2012-05-10 (×23): qty 10

## 2012-05-10 MED ORDER — ACETAMINOPHEN 160 MG/5ML PO SOLN
325.0000 mg | Freq: Four times a day (QID) | ORAL | Status: DC | PRN
  Administered 2012-05-11 – 2012-05-21 (×23): 650 mg via ORAL
  Administered 2012-05-22: 640 mg via ORAL
  Administered 2012-05-23 – 2012-05-28 (×11): 650 mg via ORAL
  Filled 2012-05-10 (×37): qty 20.3

## 2012-05-10 NOTE — Plan of Care (Signed)
Overall Plan of Care [facility]) Patient Details Name: JAHRELL SALASAR MRN: [id number] DOB: 04/30/1960  Diagnosis:  Severe deconditioning, critical illness myopathy, neuropathy  Co-morbidities: dysphagia, pneumonia,ABLA,   Functional Problem List  Patient demonstrates impairments in the following areas: Balance, Bladder, Bowel, Cognition, Edema, Endurance, Medication Management, Motor, Nutrition, Pain, Safety and Skin Integrity  Basic ADL's: eating, grooming, bathing, dressing and toileting Advanced ADL's: n/a at this time  Transfers:  bed mobility, bed to chair, toilet, tub/shower, car and furniture Locomotion:  ambulation and wheelchair mobility  Additional Impairments:  Swallowing, Communication  expression and Social Cognition   problem solving, memory, attention and awareness  Anticipated Outcomes Item Anticipated Outcome  Eating/Swallowing  Mod I for utilization of swallowing compensatory strategies  Basic self-care  Mod Assist  Tolieting  Mod Assist for transfer, total assist for toileting  Bowel/Bladder  Modified assistance  Transfers  Mod A basic transfers  Locomotion  Min A w/c mobility  Communication    Cognition  Mod I for basic  Pain  Modified assistance  Safety/Judgment  Mod I  Other  Skin: No new skin breakdown/infection.   Therapy Plan: PT Intensity: Minimum of 1-2 x/day ,45 to 90 minutes PT Frequency: Total of 15 hours over 7 days Frequency due to: decreased activity tolerance, complicated medical h/o and hospital course, pain PT Duration Estimated Length of Stay: 4 weeks (per MD note 1 week trial on CIR; may change to FamEd) OT Intensity: Minimum of 1-2 x/day, 45 to 90 minutes OT Frequency: 5 out of 7 days (may have to decrease depending on overall endurance) OT Duration/Estimated Length of Stay: 4 weeks SLP Intensity: Minumum of 1-2 x/day, 30 to 90 minutes SLP Frequency: 5 out of 7 days SLP Duration/Estimated Length of Stay: 4 weeks    Team  Interventions: xxItem RN PT OT SLP SW TR Other  Self Care/Advanced ADL Retraining  x x      Neuromuscular Re-Education  x x      Therapeutic Activities  x x x     UE/LE Strength Training/ROM  x x      UE/LE Coordination Activities  x x      Visual/Perceptual Remediation/Compensation  x x      DME/Adaptive Equipment Instruction  x x      Therapeutic Exercise  x x x     Balance/Vestibular Training  x x      Patient/Family Education x x x x     Cognitive Remediation/Compensation  x x x     Functional Mobility Training  x x      Ambulation/Gait Training  x       Stair Training  x       Wheelchair Propulsion/Positioning  x x      Functional Statistician  x       Community Reintegration   x      Dysphagia/Aspiration Precaution Training    x     Speech/Language Facilitation    x     Bladder Management x        Bowel Management x        Disease Management/Prevention x x x      Pain Management x x x      Medication Management x        Skin Care/Wound Management x x x      Splinting/Orthotics  x x      Discharge Planning  x x x     Psychosocial Support  x  x x                            Team Discharge Planning: Destination: PT-Home ,OT- Home , SLP-Home Projected Follow-up: PT-Home health PT;24 hour supervision/assistance, OT-  Home health OT, SLP-Home Health SLP;24 hour supervision/assistance Projected Equipment Needs: PT-Wheelchair (measurements);Wheelchair cushion (measurements);Sliding board, OT- 3 in 1 bedside comode;Tub/shower bench, SLP-None recommended by SLP Patient/family involved in discharge planning: PT- Patient;Family member/caregiver,  OT-Patient;Family member/caregiver, SLP-Patient  MD ELOS: 4 weeks Medical Rehab Prognosis:  Excellent Assessment: The patient has been admitted for CIR therapies. The team will be addressing, functional mobility, strength, stamina, balance, safety, adaptive techniques/equipment, self-care, bowel and bladder mgt, patient and  caregiver education, NMR, speech, cognition, swallowing, nutrition, pain mgt. Goals have been set at minimal to moderate assist with basic mobility and self-care. Mod I to supervision with cognition.    Ranelle Oyster, MD, FAAPMR      See Team Conference Notes for weekly updates to the plan of care

## 2012-05-10 NOTE — Plan of Care (Signed)
Problem: RH BOWEL ELIMINATION Goal: RH STG MANAGE BOWEL W/EQUIPMENT W/ASSISTANCE STG Manage Bowel With Equipment With max Assistance of caregiver for ostomy care  Outcome: Not Progressing Wife or staff emptying, patient can not help at this time

## 2012-05-10 NOTE — Evaluation (Signed)
Speech Language Pathology Assessment and Plan  Patient Details  Name: Connor Small MRN: [id number] Date of Birth: Jun 08, 1960  SLP Diagnosis: Cognitive Impairments  Rehab Potential: Good ELOS: 4 weeks   Today's Date: 05/10/2012 Time: 1005-1100 Time Calculation (min): 55 min  Skilled Therapeutic Intervention: Administered cognitive-linguistic evaluation. Please see below for details. Pt and his wife educated on current cognitive function and goals of skilled SLP intervention. Both verbalized understanding.   Problem List:  Patient Active Problem List  Diagnosis  . history of UC (ulcerative colitis)  . Sinus tachycardia  . Hematuria  . Pleural effusion, L > R  . Leukocytosis, unspecified  . Hyperbilirubinemia, due to cholestasis  . PAF (paroxysmal atrial fibrillation)  . Anemia of chronic disease  . Quadriparesis  . S/p total colectomy for toxic megacolon, C diff  . Hypernatremia, improving  . Severe muscle deconditioning  . Shoulder subluxation, right  . 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 Coagulopathy  . Critical illness myopathy and neuropathy  . Acute blood loss anemia  . HAP (hospital-acquired pneumonia)  . Ileostomy in place  . Protein-calorie malnutrition, severe  . Spleen hematoma - subcapsular & without rupture   . Ascites s/p US guide paracentesis (4/2)  . Aspiration pneumonitis  . Clostridium difficile colitis  . Intra-abdominal fluid collection/LUQ  . CKD (chronic kidney disease) stage 2, GFR 60-89 ml/min  . Acute renal failure  . Hypokalemia   Past Medical History:  Past Medical History  Diagnosis Date  . Chronic diarrhea   . Rectal bleed   . Hemorrhoids   . Ulcerative colitis     Distal UC over 8 yrs ago diagnosed  . Diverticulitis of large intestine with perforation 10/2011    done at [place]  . 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 history of UC (ulcerative colitis) 12/06/2010   Past Surgical History:  Past Surgical History   Procedure Laterality Date  . Colonoscopy  9/03  . Sigmoidoscopy       06/13/2002  . Temporary ostomy November 06, 2010      for a colon perforation that was done in [facility] (Dr Ruben Im).    . Colonoscopy  02/16/2011    Procedure: COLONOSCOPY;  Surgeon: Malissa Hippo, MD;  Location: [facility];  Service: Endoscopy;  Laterality: N/A;  100  . Flexible sigmoidoscopy  02/27/2012    Procedure: FLEXIBLE SIGMOIDOSCOPY;  Surgeon: Malissa Hippo, MD;  Location: [facility];  Service: Endoscopy;  Laterality: N/A;  with colonic decompression  . Laparotomy  02/27/2012    Procedure: EXPLORATORY LAPAROTOMY;  Surgeon: Fabio Bering, MD;  Location: [facility] ORS;  Service: General;  Laterality: N/A;  . Cecostomy  02/27/2012    Procedure: CECOSTOMY;  Surgeon: Fabio Bering, MD;  Location: [facility] ORS;  Service: General;  Laterality: N/A;  Cecostomy Tube Placement  . Colectomy  02/28/2012    Procedure: TOTAL COLECTOMY;  Surgeon: Fabio Bering, MD;  Location: [facility] ORS;  Service: General;  Laterality: N/A;  . Ileostomy  02/28/2012    Procedure: ILEOSTOMY;  Surgeon: Fabio Bering, MD;  Location: [facility] ORS;  Service: General;  Laterality: N/A;  . Cystoscopy w/ ureteral stent placement Bilateral 03/20/2012    Procedure: CYSTOSCOPY WITH RETROGRADE PYELOGRAM/URETERAL STENT PLACEMENT;  Surgeon: Sebastian Ache, MD;  [facility] OR;  Service: Urology;  Laterality: Bilateral;  . Laparotomy N/A 03/26/2012    Procedure: EXPLORATORY LAPAROTOMY;  Surgeon: Mariella Saa, MD;  Location: [facility] OR;  Service: General;  Laterality: N/A;  . Application of wound vac N/A 03/26/2012    Procedure: APPLICATION OF WOUND VAC;  Surgeon: Mariella Saa, MD;  Location: [facility] OR;  Service: General;  Laterality: N/A;  . Liver biopsy N/A 03/26/2012    Procedure: LIVER BIOPSY;  Surgeon: Mariella Saa, MD;  Location: [facility] OR;  Service: General;  Laterality: N/A;  . Laparotomy N/A 03/29/2012    Procedure: EXPLORATORY LAPAROTOMY, PARTIAL WOUND CLOSURE;   Surgeon: Mariella Saa, MD;  Location: [facility] OR;  Service: General;  Laterality: N/A;  . Vacuum assisted closure change N/A 03/29/2012    Procedure: Open ABDOMINAL VACUUM  CHANGE;  Surgeon: Mariella Saa, MD;  Location: [facility] OR;  Service: General;  Laterality: N/A;  . Vacuum assisted closure change N/A 04/01/2012    Procedure: removal of abdominal vac dressing and abdominal closure;  Surgeon: Mariella Saa, MD;  Location: [facility] OR;  Service: General;  Laterality: N/A;    Assessment / Plan / Recommendation Clinical Impression  Pt is a 52 year old male originally admitted via [facility] with c-diff colitis with toxic megacolon requiring total colectomy. 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 He was admitted to CIR on 04/14/12 for rehab. He was noted to have poor po intake with continued GI issues. He has aspiration event due to N/V with aspiration pneumonitis with leucocytosis. He was treated with 5 day course of antibiotics and due to concerns of HCAP was transfered to acute on 04/25/12. Wound was I and D and treated with VAC to promote healing. PEG was placed by IVR on 04/05 to help with nutritional status and endurance. On arnasep for anemia of chronic illness. Mentation and endurance improved and patient transferred to [facility] 05/09/12. Pt presents with mild cognitive impairments characterized by decreased selective attention, functional problem solving, working memory, and emergent awareness. Pt's overall cognitive function is also impacted by his anxiety. Pt also presents with a hoarse vocal quality, but it does not impact his overall functional communication or speech  intelligibility.  Pt was nauseous throughout evaluation and refused PO trials, will reattempt BSE tomorrow. Pt would benefit from skilled SLP intervention to maximize cognitive function and overall functional independence prior to discharge home.     SLP Assessment  Patient will need skilled Speech Lanaguage Pathology Services during CIR admission    Recommendations  Diet Recommendations: Regular;Thin liquid Supervision: Trained caregiver to feed patient Recommendations for Other Services: Neuropsych consult Patient destination: Home Follow up Recommendations: Home Health SLP;24 hour supervision/assistance Equipment Recommended: None recommended by SLP    SLP Frequency 5 out of 7 days   SLP Treatment/Interventions Cognitive remediation/compensation;Cueing hierarchy;Functional tasks;Environmental controls;Internal/external aids;Therapeutic Activities;Patient/family education    Pain Pain Assessment Pain Assessment: 0-10 Pain Score:   6 Pain Type: Acute pain Pain Location: Back Pain Orientation: Mid Pain Descriptors: Aching Pain Frequency: Intermittent Pain Onset: Gradual Patients Stated Pain Goal: 3 Pain Intervention(s): Medication (See eMAR);Repositioned Multiple Pain Sites: No  Short Term Goals: Week 1: SLP Short Term Goal 1 (Week 1): Pt will demonstrate sustained attention to a functional task for 15 minutes with Min A verbal and question cues for redirection. SLP Short Term Goal 2 (Week 1): Pt will utilize external memory aids to  recall new, daily information with Min A verbal cues.  SLP Short Term Goal 3 (Week 1): Pt will demonstrate functional problem solving for basic and familiar tasks with Min A verbal and question cues.  SLP Short Term Goal 4 (Week 1): Pt will perform vocal function exercises with Min A verbal cues.   See FIM for current functional status Refer to Care Plan for Long Term Goals  Recommendations for other services: Neuropsych  Discharge Criteria:  Patient will be discharged from SLP if patient refuses treatment 3 consecutive times without medical reason, if treatment goals not met, if there is a change in medical status, if patient makes no progress towards goals or if patient is discharged from hospital.  The above assessment, treatment plan, treatment alternatives and goals were discussed and mutually agreed upon: by patient and by family  Sonny Anthes 05/10/2012, 1:28 PM

## 2012-05-10 NOTE — Evaluation (Signed)
Physical Therapy Assessment and Plan  Patient Details  Name: Connor Small MRN: [id number] Date of Birth: 1960-02-09  PT Diagnosis: Abnormal posture, Cognitive deficits, Difficulty walking, Hypotonia, Muscle weakness and Quadriplegia Rehab Potential: Good ELOS: 4 weeks (per MD note 1 week trial on CIR; may change to [facility])   Today's Date: 05/10/2012 Time: 1300-1400 Time Calculation (min): 60 min  Problem List:  Patient Active Problem List  Diagnosis  . history of UC (ulcerative colitis)  . Sinus tachycardia  . Hematuria  . Pleural effusion, L > R  . Leukocytosis, unspecified  . Hyperbilirubinemia, due to cholestasis  . PAF (paroxysmal atrial fibrillation)  . Anemia of chronic disease  . Quadriparesis  . S/p total colectomy for toxic megacolon, C diff  . Hypernatremia, improving  . Severe muscle deconditioning  . Shoulder subluxation, right  . 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 Coagulopathy  . Critical illness myopathy and neuropathy  . Acute blood loss anemia  . HAP (hospital-acquired pneumonia)  . Ileostomy in place  . Protein-calorie malnutrition, severe  . Spleen hematoma - subcapsular & without rupture   . Ascites s/p US guide paracentesis (4/2)  . Aspiration pneumonitis  . Clostridium difficile colitis  . Intra-abdominal fluid collection/LUQ  . CKD (chronic kidney disease) stage 2, GFR 60-89 ml/min  . Acute renal failure  . Hypokalemia    Past Medical History:  Past Medical History  Diagnosis Date  . Chronic diarrhea   . Rectal bleed   . Hemorrhoids   . Ulcerative colitis     Distal UC over 8 yrs ago diagnosed  . Diverticulitis of large intestine with perforation 10/2011    done at [facility]  . 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 history of UC (ulcerative colitis) 12/06/2010   Past Surgical History:  Past Surgical History  Procedure Laterality Date  . Colonoscopy  9/03  . Sigmoidoscopy       06/13/2002  . Temporary ostomy November 06, 2010       for a colon perforation that was done in [facility] (Dr Ruben Im).    . Colonoscopy  02/16/2011    Procedure: COLONOSCOPY;  Surgeon: Malissa Hippo, MD;  Location: [facility];  Service: Endoscopy;  Laterality: N/A;  100  . Flexible sigmoidoscopy  02/27/2012    Procedure: FLEXIBLE SIGMOIDOSCOPY;  Surgeon: Malissa Hippo, MD;  Location: [facility];  Service: Endoscopy;  Laterality: N/A;  with colonic decompression  . Laparotomy  02/27/2012    Procedure: EXPLORATORY LAPAROTOMY;  Surgeon: Fabio Bering, MD;  Location: [facility] ORS;  Service: General;  Laterality: N/A;  . Cecostomy  02/27/2012    Procedure: CECOSTOMY;  Surgeon: Fabio Bering, MD;  Location: [facility];  Service: General;  Laterality: N/A;  Cecostomy Tube Placement  . Colectomy  02/28/2012    Procedure: TOTAL COLECTOMY;  Surgeon: Fabio Bering, MD;  Location: [facility];  Service: General;  Laterality: N/A;  . Ileostomy  02/28/2012    Procedure: ILEOSTOMY;  Surgeon: Fabio Bering, MD;  Location: [facility];  Service: General;  Laterality: N/A;  . Cystoscopy w/ ureteral stent placement Bilateral 03/20/2012    Procedure: CYSTOSCOPY WITH RETROGRADE PYELOGRAM/URETERAL STENT PLACEMENT;  Surgeon: Sebastian Ache, MD;  Location: [facility] OR;  Service: Urology;  Laterality: Bilateral;  . Laparotomy N/A 03/26/2012    Procedure: EXPLORATORY LAPAROTOMY;  Surgeon: Mariella Saa, MD;  Location: [facility] OR;  Service: General;  Laterality: N/A;  . Application of wound vac N/A 03/26/2012  Procedure: APPLICATION OF WOUND VAC;  Surgeon: Mariella Saa, MD;  Location: [facility] OR;  Service: General;  Laterality: N/A;  . Liver biopsy N/A 03/26/2012    Procedure: LIVER BIOPSY;  Surgeon: Mariella Saa, MD;  Location: [facility] OR;  Service: General;  Laterality: N/A;  . Laparotomy N/A 03/29/2012    Procedure: EXPLORATORY LAPAROTOMY, PARTIAL WOUND CLOSURE;  Surgeon: Mariella Saa, MD;  Location: [facility] OR;  Service: General;  Laterality: N/A;  . Vacuum assisted closure change  N/A 03/29/2012    Procedure: Open ABDOMINAL VACUUM  CHANGE;  Surgeon: Mariella Saa, MD;  Location: [facility] OR;  Service: General;  Laterality: N/A;  . Vacuum assisted closure change N/A 04/01/2012    Procedure: removal of abdominal vac dressing and abdominal closure;  Surgeon: Mariella Saa, MD;  [facility] OR;  Service: General;  Laterality: N/A;    Assessment & Plan Clinical Impression: Patient is a 52 y.o. year old male originally admitted via [facility] with c-diff colitis with toxic megacolon requiring total colectomy. 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 He was admitted to CIR on 04/14/12 for rehab. He was noted to have poor po intake with continued GI issues. He has aspiration event due to N/V with aspiration pneumonitis with leucocytosis. He was treated with 5 day course of antibiotics and due to concerns of HCAP was transfered to acute on 04/25/12. Wound was I and D and treated with VAC to promote healing. PEG was placed by IVR on 04/05 to help with nutritional status and endurance. On arnasep for anemia of chronic illness. Mentation and endurance improved.  Patient transferred to CIR on 05/09/2012 .   Patient currently requires total A +2 with mobility secondary to muscle weakness, muscle joint tightness and muscle paralysis, decreased cardiorespiratoy endurance, abnormal tone, motor apraxia and decreased motor planning, decreased initiation, decreased problem solving and decreased memory and decreased sitting balance, decreased standing balance, decreased postural control and decreased balance strategies.  Prior to hospitalization, patient was independent  with  mobility and lived with Spouse in a House home.  Home access is 2 - 1 step onto porch and 1 step into houseStairs to enter.  Patient will benefit from skilled PT intervention to maximize safe functional mobility, minimize fall risk and decrease caregiver burden for planned discharge home with 24 hour assist.  Anticipate patient will benefit from follow up [facility] at discharge.  PT - End of Session Activity Tolerance: Tolerates 30+ min activity with multiple rests Endurance Deficit: Yes PT Assessment Rehab Potential: Good Barriers to Discharge: Inaccessible home environment (will need to be bumped up 1 + 1 step for home entry) PT Plan PT Intensity: Minimum of 1-2 x/day ,45 to 90 minutes PT Frequency: Total of 15 hours over 7 days Frequency due to: decreased activity tolerance, complicated medical h/o and hospital course, pain PT Duration Estimated Length of Stay: 4 weeks (per MD note 1 week trial on CIR; may change to [place]) PT Treatment/Interventions: Ambulation/gait training;Balance/vestibular training;Cognitive remediation/compensation;Discharge planning;Disease management/prevention;DME/adaptive equipment instruction;Functional electrical stimulation;Functional mobility training;Neuromuscular re-education;Pain management;Patient/family education;Psychosocial support;Skin care/wound management;Splinting/orthotics;Stair training;Therapeutic Activities;Therapeutic Exercise;UE/LE Strength taining/ROM;UE/LE Coordination activities;Wheelchair propulsion/positioning PT Recommendation Recommendations for Other Services: Neuropsych consult Follow Up Recommendations: Home health PT;24 hour supervision/assistance Patient  destination: Home Equipment Recommended: Wheelchair (measurements);Wheelchair cushion (measurements);Sliding board  Skilled Therapeutic Intervention  Session focused on functional slide board transfers, bed mobility for supine <-> sit, and attempt for sit to stand using Stedy (unable to  successfully get into standing at eval due to limitations of pt regarding pain, attitude, and initiation. All tasks required total A to total A +2; max encouragement for pt to initiate or actively attempt to participate. Completed 10 reps bilaterally of AAROM therex in supine including heel slides, ankle pumps, and hip abduction. Wife present during session and also encouraged pt to work towards independence. Returned to bed end of session for nursing care needs.   PT Evaluation Precautions/Restrictions Precautions Precautions: Fall;Shoulder Type of Shoulder Precautions: right shoulder subluxation  Restrictions Weight Bearing Restrictions: No Pain  c/o general discomfort and nausea with mobility - repositioned and rest break as needed. Home Living/Prior Functioning Home Living Lives With: Spouse Available Help at Discharge: Family;Available 24 hours/day Type of Home: House Home Access: Stairs to enter [employer] of Steps: 2 - 1 step onto porch and 1 step into house Entrance Stairs-Rails: None Home Layout: One level How Accessible: Accessible via walker Vision/Perception  Praxis Praxis: Impaired Praxis Impairment Details: Ideomotor;Initiation;Motor planning  Cognition Overall Cognitive Status: Impaired/Different from baseline Arousal/Alertness: Awake/alert Orientation Level: Oriented X4 Attention: Selective Sustained Attention: Impaired Sustained Attention Impairment: Verbal basic;Functional basic Selective Attention: Impaired Selective Attention Impairment: Verbal basic;Functional basic Memory: Impaired Memory Impairment: Decreased recall of new information;Decreased short term memory Awareness: Impaired Awareness Impairment: Emergent impairment Problem Solving: Impaired Problem Solving Impairment: Functional basic;Verbal basic Behaviors:  (anxious) Safety/Judgment: Appears intact Comments: pt's cognitive function impacted by anxiety, tends to underestimates  abilities  Sensation Sensation Light Touch: Appears Intact Coordination Gross Motor Movements are Fluid and Coordinated: No Motor  Motor Motor: Motor impersistence;Abnormal postural alignment and control;Tetraplegia;Abnormal tone;Motor apraxia    Locomotion  Ambulation Ambulation/Gait Assistance: Not tested (comment)  Trunk/Postural Assessment  Cervical Assessment Cervical Assessment: Exceptions to [facility] (forward flexion) Thoracic Assessment Thoracic Assessment: Exceptions to [facility] (kyphotic posture in sitting) Lumbar Assessment Lumbar Assessment: Exceptions to [facility] (sits posterior tilt) Postural Control Postural Control: Deficits on evaluation  Balance Static Sitting Balance Static Sitting - Level of Assistance: 4: Min assist Dynamic Sitting Balance Dynamic Sitting - Level of Assistance: 2: Max assist Extremity Assessment      RLE Assessment RLE Assessment: Exceptions to [facility] RLE Strength RLE Overall Strength: Deficits RLE Overall Strength Comments: 2-/5 overall; difficult with formal testing due to pt quick to say "i can't" and not put forth effort to try. More movement noted through gross observation/ROM RLE Tone RLE Tone: Hypotonic LLE Assessment LLE Assessment: Exceptions to [facility] LLE Strength LLE Overall Strength: Deficits LLE Overall Strength Comments: 3-/5 grossly; difficulty with sustaining movement and see above in RLE note regarding pt quick to say "I can't"  FIM:  FIM - Banker Devices: Sliding board;Arm rests Bed/Chair Transfer: 1: Two helpers FIM - Locomotion: Wheelchair Locomotion: Wheelchair: 1: Total Assistance/staff pushes wheelchair (Pt<25%) (pt refused to attempt ) FIM - Locomotion: Ambulation Ambulation/Gait Assistance: Not tested (comment) Locomotion: Ambulation: 0: Activity did not occur (unsafe to attempt at eval) FIM - Locomotion: Stairs Locomotion: Stairs: 0: Activity did not occur   Refer to Care Plan  for Long Term Goals  Recommendations for other services: Neuropsych  Discharge Criteria: Patient will be discharged from PT if patient refuses treatment 3 consecutive times without medical reason, if treatment goals not met, if there is  a change in medical status, if patient makes no progress towards goals or if patient is discharged from hospital.  The above assessment, treatment plan, treatment alternatives and goals were discussed and mutually agreed upon: by patient and by family  Tedd Sias 05/10/2012, 2:16 PM

## 2012-05-10 NOTE — Progress Notes (Signed)
Subjective/Complaints: Couldn't sleep well. Has a lot of anxiety at night. Stool consistency a little better. Denies pain this am A 12 point review of systems has been performed and if not noted above is otherwise negative.   Objective: Vital Signs: Blood pressure 104/70, pulse 108, temperature 98.4 F (36.9 C), resp. rate 18, height 5' 10"  (1.778 m), weight 65.7 kg (144 lb 13.5 oz), SpO2 98.00%. No results found.  Recent Labs  05/08/12 0630 05/09/12 0635  WBC 13.7*  --   HGB 9.5* 10.3*  HCT 27.5* 30.8*  PLT 404*  --     Recent Labs  05/07/12 0900 05/08/12 0630  NA 135 136  K 4.3 3.6  CL 102 99  GLUCOSE 109* 109*  BUN 18 20  CREATININE 0.73 0.78  CALCIUM 8.5 8.4   CBG (last 3)   Recent Labs  05/09/12 1126 05/09/12 1632 05/09/12 2045  GLUCAP 142* 119* 117*    Wt Readings from Last 3 Encounters:  05/10/12 65.7 kg (144 lb 13.5 oz)  05/07/12 66.2 kg (145 lb 15.1 oz)  04/18/12 71.94 kg (158 lb 9.6 oz)    Physical Exam:  Constitutional: He appears well-developed. He has a sickly appearance.  Pale and encephalopathic appearing. Anxious, needy-multiple demands of wife during the exam.  HENT:  Head: Normocephalic and atraumatic.  Eyes: Pupils are equal, round, and reactive to light.  Neck: Normal range of motion.  Cardiovascular: Regular rhythm. Tachycardia present.  HR 100's during the exam.  Pulmonary/Chest: Effort normal. No respiratory distress.  He has no wheezes.  Abdominal: He exhibits minimal distension. Bowel sounds are increased. There is tenderness.  Midline incision with retention sutures in place--dehisced incision with pink, clean wound bed. No odor and minimal drainage on dressing. ---closing nicely Neurological: He is alert.  Distracted but redirectable. Able to find calendar to give me date. Basic insight and awareness. Was able to follow basic commands. Hoarse voice. 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 UE grossly 2- to 2/5. LE 2/5 as well  with weakness moreso in ankle dorsiflexion. DTR's 1+.  Skin: Skin is warm and dry.  Psychiatric: His mood appears anxious. A bit impulsive  Assessment/Plan:  1. Functional deficits secondary to severe deconditioning after multiple medical which require 3+ hours per day of interdisciplinary therapy in a comprehensive inpatient rehab setting. Physiatrist is providing close team supervision and 24 hour management of active medical problems listed below. Physiatrist and rehab team continue to assess barriers to discharge/monitor patient progress toward functional and medical goals. FIM:                   Comprehension Comprehension Mode: Auditory Comprehension: 5-Follows basic conversation/direction: With extra time/assistive device  Expression Expression Mode: Verbal Expression: 3-Expresses basic 50 - 74% of the time/requires cueing 25 - 50% of the time. Needs to repeat parts of sentences.  Social Interaction Social Interaction: 4-Interacts appropriately 75 - 89% of the time - Needs redirection for appropriate language or to initiate interaction.  Problem Solving Problem Solving: 3-Solves basic 50 - 74% of the time/requires cueing 25 - 49% of the time  Memory Memory: 2-Recognizes or recalls 25 - 49% of the time/requires cueing 51 - 75% of the time  Medical Problem List and Plan:  1. DVT Prophylaxis/Anticoagulation: Mechanical: Sequential compression devices, below knee Bilateral lower extremities thrombocytopenia resolved.   2. Multifactorial pain: tylenol, local measures 3. Mood: anxiety an issue, especially at night. Will schedule hs klonopin. Ask neuropsych to see as well.  4. Neuropsych: This patient is not capable of making decisions on his/her own behalf.  5. FEN::   Serial labs, adjusting fluids, formula as well 6. Bilateral cardioembolic CVA: no blood thinners due to issues with bleeding?  7. ABLA: most recent hgb 10.3. Monitor clinically. Aranesp, feso4 8. Critical  illness neuropathy-  Protect heels, stretch heel cords  -AFO's at some point  LOS (Days) 1 A FACE TO FACE EVALUATION WAS PERFORMED  SWARTZ,ZACHARY T 05/10/2012 8:16 AM

## 2012-05-10 NOTE — H&P (Signed)
CC: Critical illness myopathy  HPI: [name] is a 52 year old male originally admitted via [facility] with c-diff colitis with toxic megacolon requiring total colectomy. 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 He was admitted to CIR on 04/14/12 for rehab. He was noted to have poor po intake with continued GI issues. He has aspiration event due to N/V with aspiration pneumonitis with leucocytosis. He was treated with 5 day course of antibiotics and due to concerns of HCAP was transfered to acute on 04/25/12. Wound was I and D and treated with VAC to promote healing. PEG was placed by IVR on 04/05 to help with nutritional status and endurance. On arnasep for anemia of chronic illness. Mentation and endurance improved and patient transferred to CIR today.   Has been sitting in chair 3-6 hrs per day ROS  Past Medical History   Diagnosis  Date   .  Chronic diarrhea    .  Rectal bleed    .  Hemorrhoids    .  Ulcerative colitis      Distal UC over 8 yrs ago diagnosed   .  Diverticulitis of large intestine with perforation  10/2011     done at [facility] [place]   .  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  history of UC (ulcerative colitis)  12/06/2010    Past Surgical History   Procedure  Laterality  Date   .  Colonoscopy   9/03   .  Sigmoidoscopy       06/13/2002   .  Temporary ostomy November 06, 2010       for a colon perforation that was done in [facility] (Dr Payton Doughty).   .  Colonoscopy   02/16/2011     Procedure: COLONOSCOPY; Surgeon: Rogene Houston, MD; [facility]; Service: Endoscopy; Laterality: N/A; 100   .  Flexible sigmoidoscopy   02/27/2012     Procedure:  FLEXIBLE SIGMOIDOSCOPY; Surgeon: Rogene Houston, MD; [facility]; Service: Endoscopy; Laterality: N/A; with colonic decompression   .  Laparotomy   02/27/2012     Procedure: EXPLORATORY LAPAROTOMY; Surgeon: Donato Heinz, MD; Location: [facility]; Service: General; Laterality: N/A;   .  Cecostomy   02/27/2012     Procedure: CECOSTOMY; Surgeon: Donato Heinz, MD; Location[facility]; Service: General; Laterality: N/A; Cecostomy Tube Placement   .  Colectomy   02/28/2012     Procedure: TOTAL COLECTOMY; Surgeon: Donato Heinz, MD; Location: [facility]; Service: General; Laterality: N/A;   .  Ileostomy   02/28/2012     Procedure: ILEOSTOMY; Surgeon: Donato Heinz, MD; Location: [facility]; Service: General; Laterality: N/A;   .  Cystoscopy w/ ureteral stent placement  Bilateral  03/20/2012     Procedure: CYSTOSCOPY WITH RETROGRADE PYELOGRAM/URETERAL STENT PLACEMENT; Surgeon: Alexis Frock, MD; Location: [place]; Service: Urology; Laterality: Bilateral;   .  Laparotomy  N/A  03/26/2012     Procedure: EXPLORATORY LAPAROTOMY; Surgeon: Edward Jolly, MD; Location: [place]; Service: General; Laterality: N/A;   .  Application of wound vac  N/A  03/26/2012     Procedure: APPLICATION OF WOUND VAC; Surgeon: Edward Jolly, MD; Location: [place]; Service:  General; Laterality: N/A;   .  Liver biopsy  N/A  03/26/2012     Procedure: LIVER BIOPSY; Surgeon: Edward Jolly, MD; Location: [place]; Service: General; Laterality: N/A;   .  Laparotomy  N/A  03/29/2012     Procedure: EXPLORATORY LAPAROTOMY, PARTIAL WOUND CLOSURE; Surgeon: Edward Jolly, MD; Location: [place]; Service: General; Laterality: N/A;   .  Vacuum assisted closure change  N/A  03/29/2012     Procedure: Open ABDOMINAL VACUUM CHANGE; Surgeon: Edward Jolly, MD; Location: [place]; Service: General; Laterality: N/A;   .  Vacuum assisted closure change  N/A  04/01/2012     Procedure: removal of abdominal vac dressing and abdominal closure; Surgeon:  Edward Jolly, MD; Location[facility]; Service: General; Laterality: N/A;    Family History   Problem  Relation  Age of Onset   .  Cancer  Sister    .  Healthy  Daughter    .  Hypertension  Mother     Social History: Married. Works as a Furniture conservator/restorer for [employer] He reports that he quit smoking about 27 years ago. His smoking use included Cigarettes. He smoked 0.00 packs per day. He has never used smokeless tobacco. He reports that he does not drink alcohol or use illicit drugs.  Allergies   Allergen  Reactions   .  Penicillins  Other (See Comments)     Heart rate changes   .  Morphine And Related      Nausea/vomiting   .  Oxycodone      Nausea/vomiting   .  Imipenem  Rash    Medications Prior to Admission   Medication  Sig  Dispense  Refill   .  bifidobacterium infantis (ALIGN) capsule  Take 1 capsule by mouth daily.  14 capsule  0   .  FORTESTA 10 MG/ACT (2%) GEL  at bedtime. Patient states that he applies it to his leg     .  Mesalamine (ASACOL HD) 800 MG TBEC  Take 2 tablets (1,600 mg total) by mouth 2 (two) times daily.  180 tablet  3   .  Pseudoeph-Doxylamine-DM-APAP (NYQUIL D COLD/FLU) 60-12.06-22-998 MG/30ML LIQD  Take 30 mLs by mouth at bedtime. Cold/flu symptoms     .  Pseudoephedrine-APAP-DM (DAYQUIL MULTI-SYMPTOM COLD/FLU PO)  Take 1 capsule by mouth every 4 (four) hours as needed. Cold symptoms      Home:  Home Living  Lives With: Spouse  Available Help at Discharge: Family;Available 24 hours/day  Type of Home: House  Home Access: Stairs to enter  [employer] of Steps: 1 step onto porch and one step into house  Entrance Stairs-Rails: None  Home Layout: One level  Bathroom Shower/Tub: Tourist information centre manager: Standard  Bathroom Accessibility: Yes  How Accessible: Accessible via walker  Additional Comments: wife fmla, family close by and will help; his brother  Functional History:  Prior Function  Able to Take Stairs?: Yes  Driving: Yes   Vocation: Full time employment  Functional Status:  Mobility:  Bed Mobility  Bed Mobility: Not assessed  Rolling Right: 3: Mod assist;With rail  Rolling Right: Patient Percentage: 20%  Rolling Left: 3: Mod assist  Rolling Left: Patient Percentage: 10%  Left Sidelying to Sit: Not tested (comment)  Left Sidelying to Sit: Patient Percentage: 10%  Supine to Sit: 1: +2 Total assist  Supine to Sit: Patient Percentage: 30%  Sitting - Scoot to Edge of Bed: 1: +2 Total assist  Sitting - Scoot to Edge of Bed: Patient Percentage: 20%  Sit to Supine: 1: +2 Total assist  Sit to Supine: Patient Percentage: 20%  Sit to Sidelying Left: 1: +2 Total assist  Sit to Sidelying Left: Patient Percentage: 10%  Scooting to HOB: 1: +2 Total assist  Scooting to [facility]: Patient Percentage: 0%  Transfers  Transfers: Sit to Stand;Stand to Sit  Sit to Stand: 1: +2 Total assist;From chair/3-in-1;From elevated surface;With upper extremity assist  Sit to Stand: Patient Percentage: 10%  Stand to Sit: 1: +2 Total assist;To chair/3-in-1;With upper extremity assist;To elevated surface  Stand to Sit: Patient Percentage: 10%  Transfer via Lift Equipment: Stedy  Ambulation/Gait  Ambulation/Gait Assistance: Not tested (comment)  Stairs: No  Wheelchair Mobility  Wheelchair Mobility: No  ADL:  ADL  Eating/Feeding: +1 Total assistance (ice chips)  Where Assessed - Eating/Feeding: Bed level  Grooming: Maximal assistance;Wash/dry face  Where Assessed - Grooming: Supine, head of bed up  Upper Body Bathing: +1 Total assistance  Where Assessed - Upper Body Bathing: Supported sitting  Lower Body Bathing: +1 Total assistance  Where Assessed - Lower Body Bathing: Unsupported sitting;Rolling right and/or left  Upper Body Dressing: Simulated;+1 Total assistance  Where Assessed - Upper Body Dressing: Supported sitting (don RUE splint)  Lower Body Dressing: +1 Total assistance  Where Assessed - Lower Body Dressing: Supported  sitting;Rolling right and/or left  Toilet Transfer: +2 Total assistance;Simulated  Toilet Transfer Method: Other (comment) (sit to partial stand)  Science writer: Other (comment) Charlaine Dalton)  Equipment Used: Other (comment) (Stedy)  Transfers/Ambulation Related to ADLs: Attempted sit to partial stand for WB and trunk balance in Stedy in preparation for toilet transfers and EOB sitting  ADL Comments: Wife and daughter present during session. Report that patient is doing "better physically." Patient c/o pain/tightness in legs during WB activity. Patient also c/o soreness in RUE with movement/ROM. Wife reports she is doing exercises RUE and patient tolerated well. Patient educated in neck extension while seated in chair.  Cognition:  Cognition  Arousal/Alertness: Awake/alert  Orientation Level: Oriented X4  Cognition  Arousal/Alertness: Awake/alert  Behavior During Therapy: WFL for tasks performed  Area of Impairment: Other (comment) (Slow with processing information)  Orientation Level: Disoriented to;Time;Place  Current Attention Level: Selective  Following Commands: Follows one step commands consistently;Follows one step commands with increased time  General Comments: slow processing, slow to respond likely related to fatigue factor  Difficult to assess due to: Impaired communication  Physical Exam:  Blood pressure 110/66, pulse 102, temperature 98.3 F (36.8 C), temperature source Oral, resp. rate 16, height 5' 9"  (1.753 m), weight 66.2 kg (145 lb 15.1 oz), SpO2 100.00%.  Examination Gen. No acute distress  Mood and affect are appropriate  Memory he does not report called being on the rehabilitation unit very well but remembers certain details  Lungs are clear  Heart regular rate and rhythm  Abdomen midline incision granulating well. No evidence of drainage.  PEG site well healed.  Right lower quadrant colostomy without leakage.  Motor strength is 2 minus in the right deltoid  bicep tricep and grip.  Left upper extremity 2 minus deltoid 3+ in the biceps triceps grip finger extensors.  Right lower extremity 2 minus hip flexors knee extensors and 0/5 ankle dorsiflexor plantar flexor  Sensation intact Tone mild hypotonia  Results for orders placed during the hospital encounter of 04/25/12 (from the past 48 hour(s))   RETICULOCYTES Status: Abnormal    Collection Time  05/07/12 9:00 AM   Result  Value  Range    Retic Ct Pct  2.0  0.4 - 3.1 %    RBC.  2.19 (*)  4.22 - 5.81 MIL/uL    Retic Count, Manual  43.8  19.0 - 186.0 K/uL   BASIC METABOLIC PANEL Status: Abnormal    Collection Time    05/07/12 9:00 AM   Result  Value  Range    Sodium  135  135 - 145 mEq/L    Potassium  4.3  3.5 - 5.1 mEq/L    Chloride  102  96 - 112 mEq/L    CO2  25  19 - 32 mEq/L    Glucose, Bld  109 (*)  70 - 99 mg/dL    BUN  18  6 - 23 mg/dL    Creatinine, Ser  0.73  0.50 - 1.35 mg/dL    Calcium  8.5  8.4 - 10.5 mg/dL    GFR calc non Af Amer  >90  >90 mL/min    GFR calc Af Amer  >90  >90 mL/min    Comment:      The eGFR has been calculated     using the CKD EPI equation.     This calculation has not been     validated in all clinical     situations.     eGFR's persistently     <90 mL/min signify     possible Chronic Kidney Disease.   HEMOGLOBIN AND HEMATOCRIT, BLOOD Status: Abnormal    Collection Time    05/07/12 9:00 AM   Result  Value  Range    Hemoglobin  6.5 (*)  13.0 - 17.0 g/dL    Comment:  REPEATED TO VERIFY     CRITICAL RESULT CALLED TO, READ BACK BY AND VERIFIED WITH:     [facility] RN @ 1002 ON 05/07/12 BY LEONARD,A    HCT  20.0 (*)  39.0 - 52.0 %   PREPARE RBC (CROSSMATCH) Status: None    Collection Time    05/07/12 12:00 PM   Result  Value  Range    Order Confirmation  ORDER PROCESSED BY BLOOD BANK    GLUCOSE, CAPILLARY Status: Abnormal    Collection Time    05/07/12 12:11 PM   Result  Value  Range    Glucose-Capillary  104 (*)  70 - 99 mg/dL   TYPE AND  SCREEN Status: None    Collection Time    05/07/12 2:39 PM   Result  Value  Range    ABO/RH(D)  O NEG     Antibody Screen  NEG     Sample Expiration  05/10/2012     Unit Number  [id number]     Blood Component Type  RED CELLS,LR     Unit division  00     Status of Unit  ISSUED,FINAL     Transfusion Status  OK TO TRANSFUSE     Crossmatch Result  Compatible     Unit Number  [id number]     Blood Component Type  RBC CPDA1, LR     Unit division  00     Status of Unit  ISSUED,FINAL     Transfusion Status  OK TO TRANSFUSE     Crossmatch Result  Compatible    GLUCOSE, CAPILLARY Status: None    Collection Time    05/07/12 4:52 PM   Result  Value  Range    Glucose-Capillary  91  70 - 99 mg/dL   GLUCOSE, CAPILLARY Status: Abnormal    Collection Time    05/07/12 10:32 PM   Result  Value  Range    Glucose-Capillary  102 (*)  70 - 99 mg/dL   CBC Status: Abnormal    Collection Time    05/08/12 6:30 AM   Result  Value  Range    WBC  13.7 (*)  4.0 - 10.5 K/uL    RBC  3.18 (*)  4.22 - 5.81 MIL/uL    Hemoglobin  9.5 (*)  13.0 - 17.0 g/dL    Comment:  POST TRANSFUSION SPECIMEN    HCT  27.5 (*)  39.0 - 52.0 %    MCV  86.5  78.0 - 100.0 fL    MCH  29.9  26.0 - 34.0 pg    MCHC  34.5  30.0 - 36.0 g/dL    RDW  17.2 (*)  11.5 - 15.5 %    Platelets  404 (*)  150 - 400 K/uL   BASIC METABOLIC PANEL Status: Abnormal    Collection Time    05/08/12 6:30 AM   Result  Value  Range    Sodium  136  135 - 145 mEq/L    Potassium  3.6  3.5 - 5.1 mEq/L    Chloride  99  96 - 112 mEq/L    CO2  25  19 - 32 mEq/L    Glucose, Bld  109 (*)  70 - 99 mg/dL    BUN  20  6 - 23 mg/dL    Creatinine, Ser  0.78  0.50 - 1.35 mg/dL    Calcium  8.4  8.4 - 10.5 mg/dL    GFR calc non Af Amer  >90  >90 mL/min    GFR calc Af Amer  >90  >90 mL/min    Comment:      The eGFR has been calculated     using the CKD EPI equation.     This calculation has not been     validated in all clinical     situations.      eGFR's persistently     <90 mL/min signify     possible Chronic Kidney Disease.   GLUCOSE, CAPILLARY Status: Abnormal    Collection Time    05/08/12 8:00 AM   Result  Value  Range    Glucose-Capillary  119 (*)  70 - 99 mg/dL   GLUCOSE, CAPILLARY Status: Abnormal    Collection Time    05/08/12 11:46 AM   Result  Value  Range    Glucose-Capillary  136 (*)  70 - 99 mg/dL   GLUCOSE, CAPILLARY Status: None    Collection Time    05/08/12 5:09 PM   Result  Value  Range    Glucose-Capillary  97  70 - 99 mg/dL   GLUCOSE, CAPILLARY Status: Abnormal    Collection Time    05/08/12 10:32 PM   Result  Value  Range    Glucose-Capillary  104 (*)  70 - 99 mg/dL   MAGNESIUM Status: None    Collection Time    05/09/12 6:35 AM   Result  Value  Range    Magnesium  1.5  1.5 - 2.5 mg/dL   PHOSPHORUS Status: None    Collection Time    05/09/12 6:35 AM   Result  Value  Range    Phosphorus  3.9  2.3 - 4.6 mg/dL   HEMOGLOBIN AND HEMATOCRIT,  BLOOD Status: Abnormal    Collection Time    05/09/12 6:35 AM   Result  Value  Range    Hemoglobin  10.3 (*)  13.0 - 17.0 g/dL    HCT  30.8 (*)  39.0 - 52.0 %   GLUCOSE, CAPILLARY Status: None    Collection Time    05/09/12 7:43 AM   Result  Value  Range    Glucose-Capillary  99  70 - 99 mg/dL    Comment 1  Documented in Chart     Comment 2  Notify RN     No results found.  Post Admission Physician Evaluation:  1. 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 2. Patient is admitted to receive collaborative, interdisciplinary care between the physiatrist, rehab nursing staff, and therapy team. 3. Patient's level of medical complexity and substantial therapy needs in context of that medical necessity cannot be provided at a lesser intensity of care such as a SNF. 4. Patient has experienced substantial functional loss from his/her baseline which was documented above under the "Functional History" and "Functional  Status" headings. Judging by the patient's diagnosis, physical exam, and functional history, the patient has potential for functional progress which will result in measurable gains while on inpatient rehab. These gains will be of substantial and practical use upon discharge in facilitating mobility and self-care at the household level. 5. Physiatrist will provide 24 hour management of medical needs as well as oversight of the therapy plan/treatment and provide guidance as appropriate regarding the interaction of the two. 6. 24 hour rehab nursing will assist with bladder management, safety, skin/wound care, disease management, medication administration, pain management, patient education and ostomy care and help integrate therapy concepts, techniques,education, etc. 7. PT will assess and treat for/with: pre gait, WC mobility, NM re education, safety, endurance equipment. Goals are: Min/mod A WC level mobility. 8. OT will assess and treat for/with: ADL, Cog/percept, NM re ed,safety , endurance equipment. Goals are: Min/Mod A ADL at [place] level. 9. SLP will assess and treat for/with: cognition, swallowing. Goals are: safely meet caloric and fluid needs, po route. 10. Case Management and Social Worker will assess and treat for psychological issues and discharge planning. 11. Team conference will be held weekly to assess progress toward goals and to determine barriers to discharge. 12. Patient will receive at least 3 hours of therapy per day at least 5 days per week. 13. ELOS: 3-4 wk Prognosis: good Medical Problem List and Plan:  1. DVT Prophylaxis/Anticoagulation: Mechanical: Sequential compression devices, below knee Bilateral lower extremities  2. Pain Management: tramadol  3. Mood: monitor may need neuropsych  4. Neuropsych: This patient is not capable of making decisions on his/her own behalf.  5. Ileostomy: Wife has been educated and is able to manage ostomy without assistance  6. Anemia of critical  illness: continue aranesp for now.  7. Severe malnutrition: Dysphagia resolved and now on regular diet with thin liquids. Still needs encouragement for po intake. Continuous TF with rate slowly being increased to help supply adequate nutrition and to keep GI symptoms at [place].  8. Bilateral pleural effusions: due to protein malnutrition. Continue TF and encourage nutritional supplements/po intake. Encourage IS.  9. PAF: Will monitor HR with bid checks. Continue low dose ASA. On BB to help with tachycardia.  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  05/09/2012 Pt seen and evaluated on 4/16 documentation completed 4/17

## 2012-05-10 NOTE — Evaluation (Signed)
Occupational Therapy Assessment and Plan & Session Note  Patient Details  Name: Connor Small MRN: [id number] Date of Birth: 1960-04-20  OT Diagnosis: abnormal posture, acute pain, cognitive deficits, hemiplegia affecting dominant side, lumbago (low back pain) and muscle weakness (generalized) Rehab Potential: Rehab Potential: Good ELOS: 4 weeks   Today's Date: 05/10/2012  Problem List:  Patient Active Problem List  Diagnosis  . history of UC (ulcerative colitis)  . Sinus tachycardia  . Hematuria  . Pleural effusion, L > R  . Leukocytosis, unspecified  . Hyperbilirubinemia, due to cholestasis  . PAF (paroxysmal atrial fibrillation)  . Anemia of chronic disease  . Quadriparesis  . S/p total colectomy for toxic megacolon, C diff  . Hypernatremia, improving  . Severe muscle deconditioning  . Shoulder subluxation, right  . 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 Coagulopathy  . Critical illness myopathy and neuropathy  . Acute blood loss anemia  . HAP (hospital-acquired pneumonia)  . Ileostomy in place  . Protein-calorie malnutrition, severe  . Spleen hematoma - subcapsular & without rupture   . Ascites s/p US guide paracentesis (4/2)  . Aspiration pneumonitis  . Clostridium difficile colitis  . Intra-abdominal fluid collection/LUQ  . CKD (chronic kidney disease) stage 2, GFR 60-89 ml/min  . Acute renal failure  . Hypokalemia    Past Medical History:  Past Medical History  Diagnosis Date  . Chronic diarrhea   . Rectal bleed   . Hemorrhoids   . Ulcerative colitis     Distal UC over 8 yrs ago diagnosed  . Diverticulitis of large intestine with perforation 10/2011    done at [place] [facility]  . 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 history of UC (ulcerative colitis) 12/06/2010   Past Surgical History:  Past Surgical History  Procedure Laterality Date  . Colonoscopy  9/03  . Sigmoidoscopy       06/13/2002  . Temporary ostomy November 06, 2010      for a colon  perforation that was done in [facility] (Dr Ruben Im).    . Colonoscopy  02/16/2011    Procedure: COLONOSCOPY;  Surgeon: Malissa Hippo, MD;  [facility];  Service: Endoscopy;  Laterality: N/A;  100  . Flexible sigmoidoscopy  02/27/2012    Procedure: FLEXIBLE SIGMOIDOSCOPY;  Surgeon: Malissa Hippo, MD;  [facility];  Service: Endoscopy;  Laterality: N/A;  with colonic decompression  . Laparotomy  02/27/2012    Procedure: EXPLORATORY LAPAROTOMY;  Surgeon: Fabio Bering, MD;  Location: [facility];  Service: General;  Laterality: N/A;  . Cecostomy  02/27/2012    Procedure: CECOSTOMY;  Surgeon: Fabio Bering, MD;  Location: [facility];  Service: General;  Laterality: N/A;  Cecostomy Tube Placement  . Colectomy  02/28/2012    Procedure: TOTAL COLECTOMY;  Surgeon: Fabio Bering, MD;  Location: [facility];  Service: General;  Laterality: N/A;  . Ileostomy  02/28/2012    Procedure: ILEOSTOMY;  Surgeon: Fabio Bering, MD;  Location: [facility];  Service: General;  Laterality: N/A;  . Cystoscopy w/ ureteral stent placement Bilateral 03/20/2012    Procedure: CYSTOSCOPY WITH RETROGRADE PYELOGRAM/URETERAL STENT PLACEMENT;  Surgeon: Sebastian Ache, MD;  Location: [facility] OR;  Service: Urology;  Laterality: Bilateral;  . Laparotomy N/A 03/26/2012    Procedure: EXPLORATORY LAPAROTOMY;  Surgeon: Mariella Saa, MD;  Location: [facility] OR;  Service: General;  Laterality: N/A;  . Application of wound vac N/A 03/26/2012    Procedure: APPLICATION OF WOUND  VAC;  Surgeon: Mariella Saa, MD;  Location: [facility] OR;  Service: General;  Laterality: N/A;  . Liver biopsy N/A 03/26/2012    Procedure: LIVER BIOPSY;  Surgeon: Mariella Saa, MD;  Location: [facility] OR;  Service: General;  Laterality: N/A;  . Laparotomy N/A 03/29/2012    Procedure: EXPLORATORY LAPAROTOMY, PARTIAL WOUND CLOSURE;  Surgeon: Mariella Saa, MD;  [facility];  Service: General;  Laterality: N/A;  . Vacuum assisted closure change N/A 03/29/2012     Procedure: Open ABDOMINAL VACUUM  CHANGE;  Surgeon: Mariella Saa, MD;  [facility];  Service: General;  Laterality: N/A;  . Vacuum assisted closure change N/A 04/01/2012    Procedure: removal of abdominal vac dressing and abdominal closure;  Surgeon: Mariella Saa, MD;  [facility];  Service: General;  Laterality: N/A;    Clinical Impression: [name] is a 52 year old male originally admitted via [facility] with c-diff colitis with toxic megacolon requiring total colectomy. 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 He was admitted to CIR on 04/14/12 for rehab. He was noted to have poor po intake with continued GI issues. He has aspiration event due to N/V with aspiration pneumonitis with leucocytosis. He was treated with 5 day course of antibiotics and due to concerns of HCAP was transfered to acute on 04/25/12. Wound was I and D and treated with VAC to promote healing. PEG was placed by IVR on 04/05 to help with nutritional status and endurance. On arnasep for anemia of chronic illness. Mentation and endurance improved and patient transferred to CIR today. Patient transferred to CIR on 05/09/2012.    Patient currently requires total assist X2 with basic self-care skills secondary to muscle weakness, muscle joint tightness and muscle paralysis, abnormal tone, decreased coordination and decreased motor planning and decreased sitting balance, decreased postural control, hemiplegia and decreased balance strategies.  Prior to initial hospitalization, patient could complete ADLs and IADLs independently.  Patient will benefit from skilled intervention to increase  independence with basic self-care skills prior to discharge home with care partner.  Anticipate patient will require moderate physical assestance and follow up home health.  OT - End of Session Activity Tolerance: Tolerates 10 - 20 min activity with multiple rests Endurance Deficit: Yes OT Assessment Rehab Potential: Good Barriers to Discharge: None (none known at this time) OT Plan OT Intensity: Minimum of 1-2 x/day, 45 to 90 minutes OT Frequency: 5 out of 7 days (may have to decrease depending on overall endurance) OT Duration/Estimated Length of Stay: 4 weeks OT Treatment/Interventions: Balance/vestibular training;Cognitive remediation/compensation;Community reintegration;Discharge planning;Disease mangement/prevention;DME/adaptive equipment instruction;Functional electrical stimulation;Functional mobility training;Neuromuscular re-education;Pain management;Patient/family education;Psychosocial support;Self Care/advanced ADL retraining;Skin care/wound managment;Splinting/orthotics;Therapeutic Activities;Therapeutic Exercise;UE/LE Strength taining/ROM;UE/LE Coordination activities;Wheelchair propulsion/positioning OT Recommendation Recommendations for Other Services: Neuropsych consult Patient destination: Home Follow Up Recommendations: Home health OT Equipment Recommended: 3 in 1 bedside comode;Tub/shower bench  Precautions/Restrictions  Precautions Precautions: Fall;Shoulder Type of Shoulder Precautions: right shoulder subluxation  Required Braces or Orthoses: Other Brace/Splint Other Brace/Splint: right wrist splint Restrictions Weight Bearing Restrictions: No  General Chart Reviewed: Yes Family/Caregiver Present: Yes (Wife, Amanda)  Vital Signs Therapy Vitals Temp: 98.4 F (36.9 C) Pulse Rate: 108 Resp: 18 BP: 104/70 mmHg  Oxygen Therapy SpO2: 98 % O2 Device: None (Room air)  Pain Pain Assessment Pain Assessment: No/denies pain Pain Score: 0-No pain Faces Pain  Scale: No hurt  Home Living/Prior Functioning Home Living Lives With: Spouse Available Help at Discharge: Family;Available 24 hours/day Type of Home: House Home Access: Stairs to enter [employer] of Steps: 2 - 1 step onto porch and 1 step into house Entrance Stairs-Rails: None Home Layout: One level Bathroom Shower/Tub: Forensic scientist: Standard Bathroom Accessibility: Yes How Accessible: Accessible via walker Home Adaptive Equipment: None IADL History Homemaking Responsibilities: No Current License: Yes Mode of Transportation: Car Occupation: Full time employment Type of Occupation: Engineer, petroleum Prior Function Level of Independence: Independent with basic ADLs;Independent with gait;Independent with transfers;Independent with homemaking with ambulation Able to Take Stairs?: Yes Driving: Yes Vocation: Full time employment  ADL - See FIM  Vision/Perception  Vision - History Baseline Vision: Wears glasses only for reading Patient Visual Report: No change from baseline Vision - Assessment Additional Comments: will further asses in functional settings   Cognition - See Evaluation Navigator  Sensation Sensation Light Touch: Appears Intact Stereognosis: Not tested Hot/Cold: Not tested Proprioception: Not tested Additional Comments: LUE/hand appears intact Coordination Gross Motor Movements are Fluid and Coordinated: No Fine Motor Movements are Fluid and Coordinated: No Coordination and Movement Description: RUE/hand impaired, LUE/hand WFL  Motor - See Evaluation Navigator  Mobility - See Evaluation Navigator  Trunk/Postural Assessment - See Evaluation Navigator  Balance- See Evaluation Navigator  Extremity/Trunk Assessment RUE Assessment RUE Assessment: Exceptions to [facility] RUE AROM (degrees) Overall AROM Right Upper Extremity: Deficits;Due to pain RUE Overall AROM Comments: Patient has limited AROM in shoulder, limited  movements from elbow -> fingers. Patient also with shoulder subluxation RUE PROM (degrees) Overall PROM Right Upper Extremity: Deficits;Due to pain;Due to precautions RUE Overall PROM Comments: PROM is limited by pain throughout UE movements.  RUE Strength RUE Overall Strength: Deficits RUE Overall Strength Comments: Overall 1/5 for shoulder strength and 0/5 distal to shoulder LUE Assessment LUE Assessment: Exceptions to WFL LUE AROM (degrees) Overall AROM Left Upper Extremity: Deficits;Due to pain LUE Overall AROM Comments: Decreased AROM -> shoulder. Elbow, forearm, wrist, fingers are WFL LUE PROM (degrees) Overall PROM Left Upper Extremity: Deficits;Due to pain LUE Overall PROM Comments: WFL except end ranges of shoulder due to pain. LUE Strength LUE Overall Strength: Deficits  FIM:  FIM - Eating Eating Activity: 1: Helper performs IV, parenteral, or tube feeding FIM - Grooming Grooming: 0: Activity did not occur FIM - Bathing Bathing: 0: Activity did not occur FIM - Upper Body Dressing/Undressing Upper body dressing/undressing: 1: Two helpers FIM - Lower Body Dressing/Undressing Lower body dressing/undressing: 1: Two helpers FIM - Toileting Toileting: 0: Activity did not occur FIM - Architectural technologist Transfer: 1: Two helpers FIM - Archivist Transfers: 0-Activity did not occur FIM - IT consultant Transfers: 0-Activity did not occur or was simulated   Refer to Care Plan for Long Term Goals  Recommendations for other services: Neuropsych  Discharge Criteria: Patient will be discharged from OT if patient refuses treatment 3 consecutive times without medical reason, if treatment goals not met, if there is a change in medical status, if patient makes no progress towards goals or if patient is discharged from hospital.  The above assessment, treatment plan, treatment alternatives  and goals were discussed and mutually agreed upon: by patient  ---------------------------------------------------------------------------------------------------------------------  SESSION NOTE  [id number] - 60  Minutes Individual Therapy Patient with no complaints of pain, except when moved Initial 1:1 occupational therapy evaluation completed. Focused skilled intervention on bed mobility, UB/LB dressing, static & dynamic sitting balance sitting edge of bed, overall activity tolerance/endurance, pain management, edge of bed -> w/c slide board transfer with total assist X2, and w/c positioning once in w/c. At end of session left patient seated in w/c with quick release belt donned and RN & wife present in room.   Sinda Leedom 05/10/2012, 9:53 AM

## 2012-05-10 NOTE — Progress Notes (Signed)
INITIAL NUTRITION ASSESSMENT  DOCUMENTATION CODES Per approved criteria  -Severe malnutrition in the context of acute illness or injury   INTERVENTION: 1. Continue Vital 1.5 at 80 ml/hr x 18 hours. Hold for therapies. PO intake continues to be negligible and inconsistent. Pt would benefit from calorie count only if intake begins to significantly improve. 2. Continue current interventions - RD to assess tolerance and make adjustments to regimen as needed.  NUTRITION DIAGNOSIS: Inadequate oral intake related to poor appetite, early satiety, and lethargy as evidenced by poor meal completion.   Goal: Intake to meet >90% of estimated nutrition needs.  Monitor:  weight trends, lab trends, I/O's, PO intake, supplement tolerance  Reason for Assessment: New TF  52 y.o. male  Admitting Dx: Critical illness myopathy  ASSESSMENT: Pt with h/o UC and diverticular perforation s/p ex lap with cecostomy tube (2/3) and complicated medical course including multiple re-visits to OR, toxic megacolon s/p total colectomy and end ileostomy with intra-abdominal drain. Pt with acute respiratory and renal failure with ARDS and septic shock. Pt recently discharged to CIR, however currently admitted as inpatient due to possible aspiration PNA (4/2).   Hospital course nutrition summary:  2/2 - 2/20: permissively underfed with EN  2/21-2/26: advanced to full feeds  2/26 - 3/3: feeds held for surgery  3/4 - 3/11: resumed full feeds  3/11 - 3/12: feeds held for GI bleed  3/13 - 3/24: received full feeds  3/24: TFs discontinued diet initiated; pt with very poor oral intake; tx to rehab 3/25  3/28 - 4/1: increased n/v - pt refusing almost all meals and supplements; wife declined calorie count, requesting TPN  4/2: NGT placed to suction, surgery ordering TF initiation; tx back to acute with plans for trickle feedings.  4/3-4/4: Trickle feeds of Vital 1.2 @ 30 mL/hr  4/5: PEG placement, resume of Vital 1.2   4/6-4/9: Inconsistent TF infusion and frequent adjustment of rate. Pt has not yet reached >40 mL/hr with tolerance. Wt now 151 lbs.  4/10-11: Pt has advanced as scheduled to 60 mL/hr. Discussed RN. Plan to increase to 70 mL this afternoon.  4/14: Pt has reached goal TF rate of 75 mL/hr with tolerance. PO intake remains insufficient with improvement per pt's report.  4/15: Pt ready to begin transition to cyclic feeds. RD to order Vital 1.2 @ 85 mL/hr x21 hrs daily. Will continue with daily advancements as tolerated and shorter duration of tubefeeds.  4/16: Pt agreeable to change formula to Vital 1.5 @ 80 mL/hr x 18 hrs daily.  4/17: Transfer to CIR. Continues with order of Vital 1.5 @ 80 ml/hr x 18 hrs daily.  Current regimen is Vital 1.5 at 80 ml/hr. 200 ml free water TID. This regimen provides: 2160 kcal, 97 grams protein, 1700 ml free water. Discussed regimen with PA and RN.  Wife confirms that pt continues to have poor oral intake. She was able to verbalize that it's going to take a while for his intake to improve. Wife with concerns of increased gas with change of Vital 1.2 to Vital 1.5; however she notes that pt has Maalox ordered prn.  Pt meets criteria for severe malnutrition of acute illness given 28% wt loss in >1 month and PO/nutrition support meeting </=75% of estimated needs for >1 month.   Height: Ht Readings from Last 1 Encounters:  05/09/12 5' 10"  (1.778 m)    Weight: Wt Readings from Last 1 Encounters:  05/10/12 144 lb 13.5 oz (65.7 kg)  Ideal Body Weight: 166 lb/75.5 kg  % Ideal Body Weight: 87%  Wt Readings from Last 10 Encounters:  05/10/12 144 lb 13.5 oz (65.7 kg)  05/07/12 145 lb 15.1 oz (66.2 kg)  04/18/12 158 lb 9.6 oz (71.94 kg)  04/17/12 168 lb 14 oz (76.6 kg)  04/17/12 168 lb 14 oz (76.6 kg)  04/17/12 168 lb 14 oz (76.6 kg)  04/17/12 168 lb 14 oz (76.6 kg)  04/17/12 168 lb 14 oz (76.6 kg)  04/17/12 168 lb 14 oz (76.6 kg)  04/17/12 168 lb 14 oz (76.6  kg)    Usual Body Weight: 200 lb (prior to hospitalization)  % Usual Body Weight: 72%  BMI:  Body mass index is 20.78 kg/(m^2). WNL.  Estimated Nutritional Needs: Kcal: 2200 - 240 Protein: 120 - 140 grams Fluid: > 2.2 liters  Skin: incisions, ileostomy, otherwise intact  Diet Order: General  EDUCATION NEEDS: -No education needs identified at this time   Intake/Output Summary (Last 24 hours) at 05/10/12 0947 Last data filed at 05/10/12 0800  Gross per 24 hour  Intake   1400 ml  Output   1370 ml  Net     30 ml    Last BM: 4/16  Labs:   Recent Labs Lab 05/07/12 0900 05/08/12 0630 05/09/12 0635 05/10/12 0842  NA 135 136  --  136  K 4.3 3.6  --  5.1  CL 102 99  --  102  CO2 25 25  --  25  BUN 18 20  --  21  CREATININE 0.73 0.78  --  0.69  CALCIUM 8.5 8.4  --  8.5  MG  --   --  1.5  --   PHOS  --   --  3.9  --   GLUCOSE 109* 109*  --  140*    CBG (last 3)   Recent Labs  05/09/12 1632 05/09/12 2045 05/10/12 0730  GLUCAP 119* 117* 115*    Scheduled Meds: . aspirin  81 mg Per Tube Daily  . chlorhexidine  15 mL Mouth Rinse BID  . clonazePAM  0.5 mg Oral QHS  . [START ON 05/15/2012] darbepoetin (ARANESP) injection - NON-DIALYSIS  40 mcg Subcutaneous Q Tue-1800  . ferrous sulfate  300 mg Per Tube TID WC  . free water  200 mL Per Tube Q3H  . insulin aspart  0-5 Units Subcutaneous QHS  . insulin aspart  0-9 Units Subcutaneous TID WC  . lidocaine  1 patch Transdermal Q24H  . lip balm   Topical BID  . loperamide  2 mg Per Tube QHS  . metoCLOPramide  5 mg Oral TID  . metoprolol tartrate  6.25 mg Per Tube Daily  . multivitamin  5 mL Per Tube Daily  . pantoprazole sodium  40 mg Per Tube Q1200  . potassium chloride  20 mEq Oral BID  . saccharomyces boulardii  250 mg Oral BID    Continuous Infusions: . feeding supplement (VITAL 1.5 CAL) 1,000 mL (05/10/12 5852)    Past Medical History  Diagnosis Date  . Chronic diarrhea   . Rectal bleed   .  Hemorrhoids   . Ulcerative colitis     Distal UC over 8 yrs ago diagnosed  . Diverticulitis of large intestine with perforation 10/2011    done at [place]  . 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 history of UC (ulcerative colitis) 12/06/2010    Past Surgical History  Procedure Laterality Date  . Colonoscopy  9/03  .  Sigmoidoscopy       06/13/2002  . Temporary ostomy November 06, 2010      for a colon perforation that was done in [facility] (Dr Payton Doughty).    . Colonoscopy  02/16/2011    Procedure: COLONOSCOPY;  Surgeon: Rogene Houston, MD;  [facility];  Service: Endoscopy;  Laterality: N/A;  100  . Flexible sigmoidoscopy  02/27/2012    Procedure: FLEXIBLE SIGMOIDOSCOPY;  Surgeon: Rogene Houston, MD;  [facility];  Service: Endoscopy;  Laterality: N/A;  with colonic decompression  . Laparotomy  02/27/2012    Procedure: EXPLORATORY LAPAROTOMY;  Surgeon: Donato Heinz, MD;  Location: [facility];  Service: General;  Laterality: N/A;  . Cecostomy  02/27/2012    Procedure: CECOSTOMY;  Surgeon: Donato Heinz, MD;  Location: [facility];  Service: General;  Laterality: N/A;  Cecostomy Tube Placement  . Colectomy  02/28/2012    Procedure: TOTAL COLECTOMY;  Surgeon: Donato Heinz, MD;  Location: [facility];  Service: General;  Laterality: N/A;  . Ileostomy  02/28/2012    Procedure: ILEOSTOMY;  Surgeon: Donato Heinz, MD;  Location: [facility];  Service: General;  Laterality: N/A;  . Cystoscopy w/ ureteral stent placement Bilateral 03/20/2012    Procedure: CYSTOSCOPY WITH RETROGRADE PYELOGRAM/URETERAL STENT PLACEMENT;  Surgeon: Alexis Frock, MD;  Location: [place];  Service: Urology;  Laterality: Bilateral;  . Laparotomy N/A 03/26/2012    Procedure: EXPLORATORY LAPAROTOMY;  Surgeon: Edward Jolly, MD;  Location: [place];  Service: General;  Laterality: N/A;  . Application of wound vac N/A 03/26/2012    Procedure: APPLICATION OF WOUND VAC;  Surgeon: Edward Jolly, MD;  [facility];   Service: General;  Laterality: N/A;  . Liver biopsy N/A 03/26/2012    Procedure: LIVER BIOPSY;  Surgeon: Edward Jolly, MD;  Location: [place];  Service: General;  Laterality: N/A;  . Laparotomy N/A 03/29/2012    Procedure: EXPLORATORY LAPAROTOMY, PARTIAL WOUND CLOSURE;  Surgeon: Edward Jolly, MD;  Location: [place];  Service: General;  Laterality: N/A;  . Vacuum assisted closure change N/A 03/29/2012    Procedure: Open ABDOMINAL VACUUM  CHANGE;  Surgeon: Edward Jolly, MD;  Location: [place];  Service: General;  Laterality: N/A;  . Vacuum assisted closure change N/A 04/01/2012    Procedure: removal of abdominal vac dressing and abdominal closure;  Surgeon: Edward Jolly, MD;  Location: [place];  Service: General;  Laterality: N/A;    Inda Coke MS, RD, LDN Pager: [phone] After-hours pager: [phone]

## 2012-05-11 ENCOUNTER — Inpatient Hospital Stay (HOSPITAL_COMMUNITY): Payer: BC Managed Care – PPO | Admitting: Physical Therapy

## 2012-05-11 ENCOUNTER — Inpatient Hospital Stay (HOSPITAL_COMMUNITY): Payer: BC Managed Care – PPO | Admitting: Speech Pathology

## 2012-05-11 ENCOUNTER — Inpatient Hospital Stay (HOSPITAL_COMMUNITY): Payer: BC Managed Care – PPO | Admitting: Occupational Therapy

## 2012-05-11 LAB — GLUCOSE, CAPILLARY
Glucose-Capillary: 94 mg/dL (ref 70–99)
Glucose-Capillary: 93 mg/dL (ref 70–99)
Glucose-Capillary: 85 mg/dL (ref 70–99)
Glucose-Capillary: 121 mg/dL — ABNORMAL HIGH (ref 70–99)

## 2012-05-11 NOTE — Progress Notes (Signed)
Chaplain Note:  Chaplain visited with pt an pt's family.  Pt was sitting in a wheelchair, awake, alert, and oriented.  Pt's family was at bedside.  Chaplain provided spiritual comfort and support for pt and family.  Pt presents much more physically strong and mentally oriented than at my last visit.  Pt's spirits today are very positive, 180 deg from my previous visit.  The spiritus of the pt's family have also be bolstered by the pt's improvement.  Pt and family expressed appreciation for chaplain support.  Chaplain will continue to follow this case.  05/11/12 1400  Clinical Encounter Type  Visited With Patient and family together  Visit Type Follow-up;Spiritual support  Referral From Patient  Consult/Referral To Chaplain  Recommendations (Pt requests daily chaplain visits for prayer.)  Spiritual Encounters  Spiritual Needs Emotional  Stress Factors  Patient Stress Factors Exhausted;Health changes;Major life changes  Family Stress Factors Exhausted;Financial concerns;Loss of control;Major life changes  Jearld Lesch, Chaplain [phone]

## 2012-05-11 NOTE — Progress Notes (Signed)
Occupational Therapy Session Note  Patient Details  Name: Connor Small MRN: [id number] Date of Birth: May 02, 1960  Today's Date: 05/11/2012 Time: 1005-1030 Time Calculation (min): 25 min 1005-1100 - Co-Treatment with occupational therapist  Short Term Goals: Week 1:  OT Short Term Goal 1 (Week 1): Self Feeding:  Patient will feed himself at least 50% of 2 meals per day during this recording period. OT Short Term Goal 2 (Week 1): Grooming:  Patient will perform 2 grooming tasks with min assist to include set up once items needed are provided OT Short Term Goal 3 (Week 1): UB Dressing:  Patient will don shirt in supported sit with max assist OT Short Term Goal 4 (Week 1): Sitting balance:  Patient will sit without back support with mod assist while completing simple BADL task at least 2 days this recording period. OT Short Term Goal 5 (Week 1): UE exercises:  Patient will tolerate BUE exercises at least 4 times during this recording period.  Skilled Therapeutic Interventions/Progress Updates:  Co-Treatment with occupational therapist focusing on bed mobility, education -> wife regarding bed mobility, functional use of bilateral UEs, AAROM & PROM -> BUEs, LB dressing in supine, UB dressing seated edge of bed (also focusing on dynamic sitting balance/tolerance/endurance), edge of bed -> w/c transfer using slide board, overall activity tolerance/endurance, and w/c positioning. At end of session left patient seated in w/c with pillows for positioning, quick release belt, and hemi-tray for RUE. Patient's wife present throughout entire session and present at end of session.   Precautions:  Precautions Precautions: Fall;Shoulder Type of Shoulder Precautions: right shoulder subluxation  Required Braces or Orthoses: Other Brace/Splint Other Brace/Splint: right wrist splint Restrictions Weight Bearing Restrictions: No  See FIM for current functional status  Mane Consolo 05/11/2012, 11:00 AM

## 2012-05-11 NOTE — Progress Notes (Signed)
Physical Therapy Session Note  Patient Details  Name: Connor Small MRN: [id number] Date of Birth: 22-Nov-1960  Today's Date: 05/11/2012 Time: 1400-1500 Time Calculation (min): 60 min  Short Term Goals: Week 1:  PT Short Term Goal 1 (Week 1): Pt will perform bed mobility with total A of 1 person PT Short Term Goal 2 (Week 1): Pt will transfer bed <-> w/c  with max A with pt initiating and actively participating 25% of the time PT Short Term Goal 3 (Week 1): Pt wil be able to demonstrate dynamic sitting balance EOB with mod A for functional task x 5 min  Skilled Therapeutic Interventions/Progress Updates:    Pt agreeable but anxious about therapy initially. Pt stood in standing frame x 3 reps for 1-1.5 min each bout. Initially pt fearful and repeatedly asked to be sat down, with encouragement pt able to continue and by third stand pt demonstrated significantly improved postural control during transition. Rt. UE support by therapist during session. Pt limited at end range due to bil. LE contractures, wife reports stretching pt every morning so wife educated on stretching heel cords and hamstrings with pt supine in bed.  Sliding board level transfer wheelchair <> mat with +2 for safety, max assist of +1 and cues to carry over bil. LE recruitment from standing frame. Pt beginning to direct therapist for placement of board and wheelchair set up. Sitting balance edge of mat with PT facilitating and stretching lower back with towel into more anterior pelvic tilt to improve sitting balance. By end of session pt able to stabilize edge of mat with very close supervision. Sliding board transfer wheelchair> bed with +1 total assist, +2 total assist sit > supine.   Therapy Documentation Precautions:  Precautions Precautions: Fall;Shoulder Type of Shoulder Precautions: right shoulder subluxation  Required Braces or Orthoses: Other Brace/Splint Other Brace/Splint: right wrist splint Restrictions Weight  Bearing Restrictions: No Pain: Pain Assessment Pain Assessment: No/denies pain Pain Score:  (not rated) Pain Type: Acute pain Pain Location: Leg Pain Orientation: Right;Left Pain Descriptors: Tightness Pain Onset:  (standing in standing frame) Pain Intervention(s): Repositioned;Rest  See FIM for current functional status  Therapy/Group: Individual Therapy  Lahoma Rocker 05/11/2012, 3:16 PM

## 2012-05-11 NOTE — Evaluation (Signed)
Speech Language Pathology Bedside Swallow Evaluation  Patient Details  Name: Connor Small MRN: [id number] Date of Birth: 12-20-1960  SLP Diagnosis: Cognitive Impairments;Voice disorder;Dysphagia  Rehab Potential: Good ELOS: 4 weeks   Today's Date: 05/11/2012 Time: 1100-1140 Time Calculation (min): 40 min  Skilled Therapeutic Intervention: Administered BSE. Please see below for details.   Problem List:  Patient Active Problem List  Diagnosis  . history of UC (ulcerative colitis)  . Sinus tachycardia  . Hematuria  . Pleural effusion, L > R  . Leukocytosis, unspecified  . Hyperbilirubinemia, due to cholestasis  . PAF (paroxysmal atrial fibrillation)  . Anemia of chronic disease  . Quadriparesis  . S/p total colectomy for toxic megacolon, C diff  . Hypernatremia, improving  . Severe muscle deconditioning  . Shoulder subluxation, right  . 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 Coagulopathy  . Critical illness myopathy and neuropathy  . Acute blood loss anemia  . HAP (hospital-acquired pneumonia)  . Ileostomy in place  . Protein-calorie malnutrition, severe  . Spleen hematoma - subcapsular & without rupture   . Ascites s/p US guide paracentesis (4/2)  . Aspiration pneumonitis  . Clostridium difficile colitis  . Intra-abdominal fluid collection/LUQ  . CKD (chronic kidney disease) stage 2, GFR 60-89 ml/min  . Acute renal failure  . Hypokalemia   Past Medical History:  Past Medical History  Diagnosis Date  . Chronic diarrhea   . Rectal bleed   . Hemorrhoids   . Ulcerative colitis     Distal UC over 8 yrs ago diagnosed  . Diverticulitis of large intestine with perforation 10/2011    done at [place] [facility]  . 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 history of UC (ulcerative colitis) 12/06/2010   Past Surgical History:  Past Surgical History  Procedure Laterality Date  . Colonoscopy  9/03  . Sigmoidoscopy       06/13/2002  . Temporary ostomy November 06, 2010     for a colon perforation that was done in [facility] (Dr Ruben Im).    . Colonoscopy  02/16/2011    Procedure: COLONOSCOPY;  Surgeon: Malissa Hippo, MD;  Location: [facility];  Service: Endoscopy;  Laterality: N/A;  100  . Flexible sigmoidoscopy  02/27/2012    Procedure: FLEXIBLE SIGMOIDOSCOPY;  Surgeon: Malissa Hippo, MD;  Location: [facility];  Service: Endoscopy;  Laterality: N/A;  with colonic decompression  . Laparotomy  02/27/2012    Procedure: EXPLORATORY LAPAROTOMY;  Surgeon: Fabio Bering, MD;  Location: [facility];  Service: General;  Laterality: N/A;  . Cecostomy  02/27/2012    Procedure: CECOSTOMY;  Surgeon: Fabio Bering, MD;  Location: [facility];  Service: General;  Laterality: N/A;  Cecostomy Tube Placement  . Colectomy  02/28/2012    Procedure: TOTAL COLECTOMY;  Surgeon: Fabio Bering, MD;  Location: [facility];  Service: General;  Laterality: N/A;  . Ileostomy  02/28/2012    Procedure: ILEOSTOMY;  Surgeon: Fabio Bering, MD;  Location: [facility];  Service: General;  Laterality: N/A;  . Cystoscopy w/ ureteral stent placement Bilateral 03/20/2012    Procedure: CYSTOSCOPY WITH RETROGRADE PYELOGRAM/URETERAL STENT PLACEMENT;  Surgeon: Sebastian Ache, MD;  Location: [facility] OR;  Service: Urology;  Laterality: Bilateral;  . Laparotomy N/A 03/26/2012    Procedure: EXPLORATORY LAPAROTOMY;  Surgeon: Mariella Saa, MD;  Location: [facility] OR;  Service: General;  Laterality: N/A;  . Application of wound vac N/A 03/26/2012    Procedure: APPLICATION OF WOUND VAC;  Surgeon: Mariella Saa, MD;  Location: [facility] OR;  Service: General;  Laterality: N/A;  . Liver biopsy N/A 03/26/2012    Procedure: LIVER BIOPSY;  Surgeon: Mariella Saa, MD;  Location: [facility] OR;  Service: General;  Laterality: N/A;  . Laparotomy N/A 03/29/2012    Procedure: EXPLORATORY LAPAROTOMY, PARTIAL WOUND CLOSURE;  Surgeon: Mariella Saa, MD;  Location: [facility] OR;  Service: General;  Laterality: N/A;  . Vacuum assisted closure change N/A  03/29/2012    Procedure: Open ABDOMINAL VACUUM  CHANGE;  Surgeon: Mariella Saa, MD;  Location: [facility] OR;  Service: General;  Laterality: N/A;  . Vacuum assisted closure change N/A 04/01/2012    Procedure: removal of abdominal vac dressing and abdominal closure;  Surgeon: Mariella Saa, MD;  Location: [facility] OR;  Service: General;  Laterality: N/A;    Assessment / Plan / Recommendation Clinical Impression  Pt is currently consuming regular textures with thin liquids via a straw and requires intermittent supervision verbal cues to utilize multiple swallows and an intermittent throat clear. Pt without overt s/s of aspiration throughout his lunch.  Pt continues with a hoarse vocal quality and weak cough/throat clear and would benefit from continued diaphragmatic breathing exercises.     SLP Assessment  Patient will need skilled Speech Lanaguage Pathology Services during CIR admission    Recommendations  Diet Recommendations: Regular;Thin liquid Liquid Administration via: Straw;Cup Medication Administration: Via alternative means Supervision: Patient able to self feed;Trained caregiver to feed patient Compensations: Slow rate;Small sips/bites;Multiple dry swallows after each bite/sip;Effortful swallow;Clear throat intermittently Postural Changes and/or Swallow Maneuvers: Seated upright 90 degrees;Upright 30-60 min after meal Oral Care Recommendations: Oral care BID Recommendations for Other Services: Neuropsych consult Patient destination: Home Follow up Recommendations: 24 hour supervision/assistance;Home Health SLP Equipment Recommended: None recommended by SLP    SLP Frequency 5 out of 7 days   SLP Treatment/Interventions Cognitive remediation/compensation;Cueing hierarchy;Dysphagia/aspiration precaution training;Functional tasks;Environmental controls;Internal/external aids;Speech/Language facilitation;Therapeutic Activities;Patient/family education    Pain Pain Assessment Pain  Assessment: No/denies pain  Short Term Goals: Week 1: SLP Short Term Goal 1 (Week 1): Pt will demonstrate sustained attention to a functional task for 15 minutes with Min A verbal and question cues for redirection. SLP Short Term Goal 2 (Week 1): Pt will utilize external memory aids to recall new, daily information with Min A verbal cues.  SLP Short Term Goal 3 (Week 1): Pt will demonstrate functional problem solving for basic and familiar tasks with Min A verbal and question cues.  SLP Short Term Goal 4 (Week 1): Pt will perform vocal function exercises with Min A verbal cues.  SLP Short Term Goal 5 (Week 1): Pt will utilize swallowing compensatory strategies to minimize overt s/s of aspiration with Mod I.   See FIM for current functional status Refer to Care Plan for Long Term Goals  Recommendations for other services: Neuropsych  Discharge Criteria: Patient will be discharged from SLP if patient refuses treatment 3 consecutive times without medical reason, if treatment goals not met, if there is a change in medical status, if patient makes no progress towards goals or if patient is discharged from hospital.  The above assessment, treatment plan, treatment alternatives and goals were discussed and mutually agreed upon: by patient and by family  Arlena Marsan 05/11/2012, 2:39 PM

## 2012-05-11 NOTE — Progress Notes (Addendum)
Subjective/Complaints: Slept much better. No anxiety last night. Just waking up. Doesn't feel groggy this am. Made it through therapies yesterday A 12 point review of systems has been performed and if not noted above is otherwise negative.   Objective: Vital Signs: Blood pressure 113/73, pulse 109, temperature 98.2 F (36.8 C), temperature source Oral, resp. rate 18, height 5' 10"  (1.778 m), weight 67.7 kg (149 lb 4 oz), SpO2 97.00%. No results found.  Recent Labs  05/09/12 0635 05/10/12 0842  WBC  --  14.1*  HGB 10.3* 9.6*  HCT 30.8* 28.6*  PLT  --  PLATELET CLUMPS NOTED ON SMEAR, COUNT APPEARS ADEQUATE    Recent Labs  05/10/12 0842  NA 136  K 5.1  CL 102  GLUCOSE 140*  BUN 21  CREATININE 0.69  CALCIUM 8.5   CBG (last 3)   Recent Labs  05/10/12 1657 05/10/12 2052 05/11/12 0719  GLUCAP 124* 116* 133*    Wt Readings from Last 3 Encounters:  05/11/12 67.7 kg (149 lb 4 oz)  05/07/12 66.2 kg (145 lb 15.1 oz)  04/18/12 71.94 kg (158 lb 9.6 oz)    Physical Exam:  Constitutional: He appears well-developed. He has a sickly appearance.  Less anxious, very relaxed  HENT:  Head: Normocephalic and atraumatic.  Eyes: Pupils are equal, round, and reactive to light.  Neck: Normal range of motion.  Cardiovascular: Regular rhythm. Tachycardia present.  HR 100's during the exam.  Pulmonary/Chest: Effort normal. No respiratory distress.  He has no wheezes.  Abdominal: He exhibits minimal distension. Bowel sounds are increased. There is tenderness.  Midline incision with retention sutures in place--dehisced incision with pink, clean wound bed. No odor and minimal drainage on dressing. ---closing nicely--central area still with depth Neurological: He is alert.   Basic insight and awareness. Was able to follow basic commands. Hoarse voice. 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 UE grossly 2- to 2/5. LE 2/5 as well with weakness moreso in ankle dorsiflexion. DTR's 1+. Diffuse  muscle wasting.  Skin: Skin is warm and dry.  Psychiatric: relaxed  Assessment/Plan:  1. Functional deficits secondary to severe deconditioning after multiple medical which require 3+ hours per day of interdisciplinary therapy in a comprehensive inpatient rehab setting. Physiatrist is providing close team supervision and 24 hour management of active medical problems listed below. Physiatrist and rehab team continue to assess barriers to discharge/monitor patient progress toward functional and medical goals. FIM: FIM - Bathing Bathing: 0: Activity did not occur  FIM - Upper Body Dressing/Undressing Upper body dressing/undressing: 1: Two helpers FIM - Lower Body Dressing/Undressing Lower body dressing/undressing: 1: Two helpers  FIM - Toileting Toileting: 0: Activity did not occur  FIM - Air cabin crew Transfers: 0-Activity did not occur  FIM - Control and instrumentation engineer Devices: Sliding board;Arm rests Bed/Chair Transfer: 1: Two helpers  FIM - Locomotion: Wheelchair Locomotion: Wheelchair: 1: Total Assistance/staff pushes wheelchair (Pt<25%) (pt refused to attempt ) FIM - Locomotion: Ambulation Ambulation/Gait Assistance: Not tested (comment) Locomotion: Ambulation: 0: Activity did not occur (unsafe to attempt at eval)  Comprehension Comprehension Mode: Auditory Comprehension: 6-Follows complex conversation/direction: With extra time/assistive device  Expression Expression Mode: Verbal Expression: 5-Expresses basic 90% of the time/requires cueing < 10% of the time.  Social Interaction Social Interaction: 4-Interacts appropriately 75 - 89% of the time - Needs redirection for appropriate language or to initiate interaction.  Problem Solving Problem Solving: 4-Solves basic 75 - 89% of the time/requires cueing 10 - 24% of the  time  Memory Memory: 4-Recognizes or recalls 75 - 89% of the time/requires cueing 10 - 24% of the time  Medical  Problem List and Plan:  1. DVT Prophylaxis/Anticoagulation: Mechanical: Sequential compression devices, below knee Bilateral lower extremities thrombocytopenia resolved.   2. Multifactorial pain: tylenol, local measures 3. Mood: neuropsych consult requested. Needs encouragement  -klonopin very helpful for anxiety and sleep last night. 4. Neuropsych: This patient is not capable of making decisions on his/her own behalf.  5. FEN::   Serial labs, adjusting fluids, formula as well. Potassium slightly elevated--recheck monday 6. Bilateral cardioembolic CVA: no blood thinners due to issues with bleeding?  7. ABLA: most recent hgb 9.6.  Monitor clinically. Aranesp, feso4--recheck monday 8. Critical illness neuropathy, severe muscle wasting  Protect heels, stretch heel cords  -AFO's at some point?  LOS (Days) 2 A FACE TO FACE EVALUATION WAS PERFORMED  Caleen Taaffe T 05/11/2012 9:22 AM

## 2012-05-11 NOTE — Progress Notes (Signed)
Patient information reviewed and entered into eRehab system by Lucinda Spells, RN, CRRN, PPS Coordinator.  Information including medical coding and functional independence measure will be reviewed and updated through discharge.    

## 2012-05-11 NOTE — Progress Notes (Signed)
Occupational Therapy Note  Patient Details  Name: Connor Small MRN: [id number] Date of Birth: [birth year] Today's Date: 05/11/2012  Pain:  Right shoulder= 0/10 without movement; 8/10 with shoulder flexion Co treatment with OT  (30 mins).  Total treatment= 60 min  Engaged in bed mobility, pt/family education, transfers, RUE PROM, LUE AAROM.  Engaged in educating wife with rolling pt safely and with pt assisting.  Pt. Rolled to both sides with max assist.  Did dressing in supine for LB and EOB for UB.  Pt was max assist for sitting balance.  Did lateral leans for transfer board placement.  Pt. Transferred to wc with total assist.  Wife educated on all above, but needs more practice.     Lisa Roca 05/11/2012, 11:00 AM

## 2012-05-11 NOTE — Progress Notes (Signed)
NUTRITION FOLLOW UP  DOCUMENTATION CODES  Per approved criteria   -Severe malnutrition in the context of acute illness or injury    Intervention:   1. Continue Vital 1.5 at 80 ml/hr x 18 hours. Hold for therapies. PO intake continues to be negligible and inconsistent. Pt would benefit from calorie count only if intake begins to significantly improve.  2. Continue current interventions - RD to assess tolerance and make adjustments to regimen as needed.  Nutrition Dx:   Inadequate oral intake related to poor appetite, early satiety, and lethargy as evidenced by poor meal completion.   Goal:   Intake to meet >90% of estimated nutrition needs.  Monitor:   weight trends, lab trends, I/O's, PO intake, supplement tolerance  Assessment:   Pt with h/o UC and diverticular perforation s/p ex lap with cecostomy tube (2/3) and complicated medical course including multiple re-visits to OR, toxic megacolon s/p total colectomy and end ileostomy with intra-abdominal drain. Pt with acute respiratory and renal failure with ARDS and septic shock. Pt recently discharged to CIR, however currently admitted as inpatient due to possible aspiration PNA (4/2).   Hospital course nutrition summary:  2/2 - 2/20: permissively underfed with EN  2/21-2/26: advanced to full feeds  2/26 - 3/3: feeds held for surgery  3/4 - 3/11: resumed full feeds  3/11 - 3/12: feeds held for GI bleed  3/13 - 3/24: received full feeds  3/24: TFs discontinued diet initiated; pt with very poor oral intake; tx to rehab 3/25  3/28 - 4/1: increased n/v - pt refusing almost all meals and supplements; wife declined calorie count, requesting TPN  4/2: NGT placed to suction, surgery ordering TF initiation; tx back to acute with plans for trickle feedings.  4/3-4/4: Trickle feeds of Vital 1.2 @ 30 mL/hr  4/5: PEG placement, resume of Vital 1.2  4/6-4/9: Inconsistent TF infusion and frequent adjustment of rate. Pt has not yet reached >40 mL/hr  with tolerance. Wt now 151 lbs.  4/10-11: Pt has advanced as scheduled to 60 mL/hr. Discussed RN. Plan to increase to 70 mL this afternoon.  4/14: Pt has reached goal TF rate of 75 mL/hr with tolerance. PO intake remains insufficient with improvement per pt's report.  4/15: Pt ready to begin transition to cyclic feeds. RD to order Vital 1.2 @ 85 mL/hr x21 hrs daily. Will continue with daily advancements as tolerated and shorter duration of tubefeeds.  4/16: Pt agreeable to change formula to Vital 1.5 @ 80 mL/hr x 18 hrs daily.  4/17: Transfer to CIR. Continues with order of Vital 1.5 @ 80 ml/hr x 18 hrs daily.  Current regimen is Vital 1.5 at 80 ml/hr. 200 ml free water TID. This regimen provides: 2160 kcal, 97 grams protein, 1700 ml free water. RN reports pt is tolerating TF well at this time, notes that pt's gas has improved.  RN notes pt's oral intake is improving - will consider calorie count next week.  Height: Ht Readings from Last 1 Encounters:  05/09/12 5' 10"  (1.778 m)    Weight Status:   Wt Readings from Last 1 Encounters:  05/11/12 149 lb 4 oz (67.7 kg)    Re-estimated needs:  Kcal: 2200 - 2400 Protein: 120 - 140 grams Fluid: > 2.2 liters  Skin: incisions, ileostomy, otherwise intact  Diet Order: General   Intake/Output Summary (Last 24 hours) at 05/11/12 1339 Last data filed at 05/11/12 0920  Gross per 24 hour  Intake    960 ml  Output   2270 ml  Net  -1310 ml    Last BM: 4/18   Labs:   Recent Labs Lab 05/07/12 0900 05/08/12 0630 05/09/12 0635 05/10/12 0842  NA 135 136  --  136  K 4.3 3.6  --  5.1  CL 102 99  --  102  CO2 25 25  --  25  BUN 18 20  --  21  CREATININE 0.73 0.78  --  0.69  CALCIUM 8.5 8.4  --  8.5  MG  --   --  1.5  --   PHOS  --   --  3.9  --   GLUCOSE 109* 109*  --  140*    CBG (last 3)   Recent Labs  05/10/12 2052 05/11/12 0719 05/11/12 1100  GLUCAP 116* 133* 93    Scheduled Meds: . aspirin  81 mg Per Tube Daily   . chlorhexidine  15 mL Mouth Rinse BID  . clonazePAM  0.5 mg Oral QHS  . [START ON 05/15/2012] darbepoetin (ARANESP) injection - NON-DIALYSIS  40 mcg Subcutaneous Q Tue-1800  . ferrous sulfate  300 mg Per Tube TID WC  . free water  200 mL Per Tube Q3H  . insulin aspart  0-5 Units Subcutaneous QHS  . insulin aspart  0-9 Units Subcutaneous TID WC  . lidocaine  1 patch Transdermal Q24H  . lip balm   Topical BID  . loperamide  2 mg Per Tube QHS  . loperamide  2-4 mg Per Tube UD  . metoCLOPramide  10 mg Oral TID AC  . metoprolol tartrate  6.25 mg Per Tube Daily  . multivitamin  5 mL Per Tube Daily  . pantoprazole sodium  40 mg Per Tube Q1200  . saccharomyces boulardii  250 mg Oral BID    Continuous Infusions: . feeding supplement (VITAL 1.5 CAL) 1,000 mL (05/11/12 0007)    Inda Coke MS, RD, LDN Pager: [phone] After-hours pager: [phone]

## 2012-05-12 ENCOUNTER — Inpatient Hospital Stay (HOSPITAL_COMMUNITY): Payer: BC Managed Care – PPO | Admitting: *Deleted

## 2012-05-12 ENCOUNTER — Encounter (HOSPITAL_COMMUNITY): Payer: BC Managed Care – PPO | Admitting: Occupational Therapy

## 2012-05-12 ENCOUNTER — Inpatient Hospital Stay (HOSPITAL_COMMUNITY): Payer: BC Managed Care – PPO | Admitting: Speech Pathology

## 2012-05-12 NOTE — Progress Notes (Signed)
Speech Language Pathology Daily Session Note  Patient Details  Name: Connor Small MRN: [id number] Date of Birth: 1960-04-13  Today's Date: 05/12/2012 Time: 9518-8416 Time Calculation (min): 39 min  Short Term Goals: Week 1: SLP Short Term Goal 1 (Week 1): Pt will demonstrate sustained attention to a functional task for 15 minutes with Min A verbal and question cues for redirection. SLP Short Term Goal 2 (Week 1): Pt will utilize external memory aids to recall new, daily information with Min A verbal cues.  SLP Short Term Goal 3 (Week 1): Pt will demonstrate functional problem solving for basic and familiar tasks with Min A verbal and question cues.  SLP Short Term Goal 4 (Week 1): Pt will perform vocal function exercises with Min A verbal cues.  SLP Short Term Goal 5 (Week 1): Pt will utilize swallowing compensatory strategies to minimize overt s/s of aspiration with Mod I.   Skilled Therapeutic Interventions: Skilled intervention focused on working memory and learning new information. SLP facilitated session by providing Min verbal/visual cues for recall of new information presented. Pt was able to teach his wife a new card game with Min verbal/visual cues from SLP for recall and organization/planning. SLP dictated a list of rules for pt to use as an external memory aid.    FIM:  Comprehension Comprehension Mode: Auditory Comprehension: 5-Follows basic conversation/direction: With no assist Expression Expression Mode: Verbal Expression: 6-Expresses complex ideas: With extra time/assistive device Social Interaction Social Interaction: 6-Interacts appropriately with others with medication or extra time (anti-anxiety, antidepressant). Problem Solving Problem Solving: 3-Solves basic 50 - 74% of the time/requires cueing 25 - 49% of the time Memory Memory: 5-Recognizes or recalls 90% of the time/requires cueing < 10% of the time FIM - Eating Eating Activity: 0: Activity did not  occur  Pain Pain Assessment Pain Assessment: No/denies pain  Therapy/Group: Individual Therapy  Germain Osgood, M.A. CF-SLP  Germain Osgood 05/12/2012, 12:50 PM

## 2012-05-12 NOTE — Progress Notes (Signed)
Physical Therapy Session Note  Patient Details  Name: Connor Small MRN: [id number] Date of Birth: [birth year]  Today's Date: 05/12/2012 Time: 1300-1330 Time Calculation (min): 30 min  Short Term Goals: Week 1:  PT Short Term Goal 1 (Week 1): Pt will perform bed mobility with total A of 1 person PT Short Term Goal 2 (Week 1): Pt will transfer bed <-> w/c  with max A with pt initiating and actively participating 25% of the time PT Short Term Goal 3 (Week 1): Pt wil be able to demonstrate dynamic sitting balance EOB with mod A for functional task x 5 min  Skilled Therapeutic Interventions/Progress Updates:    Patient received sitting in wheelchair, visitors present and wife performing wheelchair tilt for pressure relief. Patient and patient's wife requesting that patient be returned to bed at end of session because patient has been up in chair since 8:30 this AM. Therapist agrees to leave time at end of session to return patient to bed. In gym, patient asks if a lift will be used to put him back in bed. Therapist reports slideboard will be used. This session focused on sitting balance and anterior weight shifts and B LE NMR: In sitting, patient performed 2 sets x10 reps B hip flexion, knee extension, hip adduction with ball squeeze for 3", heel raises, and toe raises. Patient requires active assist for most of R LE exercises secondary to strength deficits. Patient performed anterior weight shifts seated in wheelchair. Educated patient that anterior weight shifts can be performed as another means of pressure relief, but are only to be performed when someone is present secondary to balance concerns.  Upon returned to patient's room, environmental services present for room cleaning, wife and visitors not in room. Patient reports he cannot tolerate the smell of cleaning products. Attempted to encourage patient that cleaning is almost done and windows could be opened to assist with ventilating room and  decreasing fumes. Patient reports he cannot be in his room and requests to be seated at nurse's station until wife and visitors return. Patient left seated in wheelchair at nurse's station, RN notified. Followed up with patient's wife after session and explained why patient had not been returned to bed. Wife verbalized understanding.  Therapy Documentation Precautions:  Precautions Precautions: Fall;Shoulder Type of Shoulder Precautions: right shoulder subluxation  Precaution Comments:  ostomy, tachycardia, Afib, Wound vac, peg tube (doesn't wear abd binder anymore) Required Braces or Orthoses: Other Brace/Splint Other Brace/Splint: right wrist splint Restrictions Weight Bearing Restrictions: No Pain: Pain Assessment Pain Assessment: No/denies pain Pain Score: 0-No pain Locomotion : Ambulation Ambulation/Gait Assistance: Not tested (comment)   See FIM for current functional status  Therapy/Group: Individual Therapy  Lillia Abed. Boluwatife Flight, PT, DPT  05/12/2012, 2:54 PM

## 2012-05-12 NOTE — Progress Notes (Signed)
Occupational Therapy Note  Patient Details  Name: TION TSE MRN: [id number] Date of Birth: 12-Apr-1960 Today's Date: 05/12/2012  Time In:  0800 Time Out:  0900  Individual session no c/o pain.  ADL retraining with focus on rolling to left and right, supine to sit, sitting balance at EOB, sliding board transfers bed to wheelchair, increasing functional use and strength of LUE and BLE's extremities, maximizing independence.  Patient presents with significant learned helplessness and needs constant reminders and cueing to be as independent as possible. Wife present also needs encouragement to allow patient to complete as much of the tasks as possible.  Patient and wife will need ongoing education and coaching in this area if patient is to realized full potential.     Quay Burow 05/12/2012, 12:03 PM

## 2012-05-12 NOTE — Progress Notes (Signed)
Subjective/Complaints: Didn't sleep quite as well, but still did fairly well. Stood in standing frame yesterday. A 12 point review of systems has been performed and if not noted above is otherwise negative.   Objective: Vital Signs: Blood pressure 110/70, pulse 115, temperature 98.9 F (37.2 C), temperature source Oral, resp. rate 18, height 5' 10"  (1.778 m), weight 65.8 kg (145 lb 1 oz), SpO2 96.00%. No results found.  Recent Labs  05/10/12 0842  WBC 14.1*  HGB 9.6*  HCT 28.6*  PLT PLATELET CLUMPS NOTED ON SMEAR, COUNT APPEARS ADEQUATE    Recent Labs  05/10/12 0842  NA 136  K 5.1  CL 102  GLUCOSE 140*  BUN 21  CREATININE 0.69  CALCIUM 8.5   CBG (last 3)   Recent Labs  05/11/12 1100 05/11/12 1645 05/11/12 2033  GLUCAP 93 85 121*    Wt Readings from Last 3 Encounters:  05/12/12 65.8 kg (145 lb 1 oz)  05/07/12 66.2 kg (145 lb 15.1 oz)  04/18/12 71.94 kg (158 lb 9.6 oz)    Physical Exam:  Constitutional: He appears well-developed. He has a sickly appearance.   HENT:  Head: Normocephalic and atraumatic.  Eyes: Pupils are equal, round, and reactive to light.  Neck: Normal range of motion.  Cardiovascular: Regular rhythm. Tachycardia present.  HR 100's during the exam.  Pulmonary/Chest: Effort normal. No respiratory distress.  He has no wheezes.  Abdominal: He exhibits minimal distension. Bowel sounds are increased. There is tenderness.  Midline incision with retention sutures in place--dehisced incision with pink, clean wound bed. No odor and minimal drainage on dressing. ---closing nicely--central area still with depth Neurological: He is alert.   Basic insight and awareness. Was able to follow basic commands. Hoarse voice. 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 UE grossly  2/5. LE 1-2/5with weakness moreso in ankle dorsiflexion. DTR's 1+. Diffuse muscle wasting.  Skin: Skin is warm and dry.  Psychiatric: relaxed  Assessment/Plan:  1. Functional  deficits secondary to severe deconditioning after multiple medical which require 3+ hours per day of interdisciplinary therapy in a comprehensive inpatient rehab setting. Physiatrist is providing close team supervision and 24 hour management of active medical problems listed below. Physiatrist and rehab team continue to assess barriers to discharge/monitor patient progress toward functional and medical goals. FIM: FIM - Bathing Bathing: 0: Activity did not occur (evening bath for now)  FIM - Upper Body Dressing/Undressing Upper body dressing/undressing: 2: Max-Patient completed 25-49% of tasks FIM - Lower Body Dressing/Undressing Lower body dressing/undressing: 1: Two helpers  FIM - Toileting Toileting: 0: Activity did not occur  FIM - Air cabin crew Transfers: 0-Activity did not occur  FIM - Control and instrumentation engineer Devices: Sliding board Bed/Chair Transfer: 1: Two helpers  FIM - Locomotion: Wheelchair Locomotion: Wheelchair: 1: Total Assistance/staff pushes wheelchair (Pt<25%) (pt refused to attempt ) FIM - Locomotion: Ambulation Ambulation/Gait Assistance: Not tested (comment) Locomotion: Ambulation: 0: Activity did not occur (unsafe to attempt at eval)  Comprehension Comprehension Mode: Auditory Comprehension: 6-Follows complex conversation/direction: With extra time/assistive device  Expression Expression Mode: Verbal Expression: 6-Expresses complex ideas: With extra time/assistive device  Social Interaction Social Interaction Mode: Not assessed Social Interaction: 4-Interacts appropriately 75 - 89% of the time - Needs redirection for appropriate language or to initiate interaction.  Problem Solving Problem Solving: 4-Solves basic 75 - 89% of the time/requires cueing 10 - 24% of the time  Memory Memory: 4-Recognizes or recalls 75 - 89% of the time/requires cueing 10 - 24%  of the time  Medical Problem List and Plan:  1. DVT  Prophylaxis/Anticoagulation: Mechanical: Sequential compression devices, below knee Bilateral lower extremities thrombocytopenia resolved.   2. Multifactorial pain: tylenol, local measures 3. Mood: neuropsych consult requested. Needs encouragement  -klonopin to continue 4. Neuropsych: This patient is not capable of making decisions on his/her own behalf.  5. FEN::   Serial labs, adjusting fluids, formula as well. Potassium slightly elevated--recheck Monday  -change to boluses at some point depending upon PO intake 6. Bilateral cardioembolic CVA: no blood thinners due to issues with bleeding?  7. ABLA: most recent hgb 9.6.  Monitor clinically. Aranesp, feso4--recheck monday 8. Critical illness neuropathy, severe muscle wasting  Protect heels, stretch heel cords  -AFO's at some point?  LOS (Days) 3 A FACE TO FACE EVALUATION WAS PERFORMED  SWARTZ,ZACHARY T 05/12/2012 7:57 AM

## 2012-05-13 ENCOUNTER — Inpatient Hospital Stay (HOSPITAL_COMMUNITY): Payer: BC Managed Care – PPO | Admitting: Physical Therapy

## 2012-05-13 ENCOUNTER — Inpatient Hospital Stay (HOSPITAL_COMMUNITY): Payer: BC Managed Care – PPO | Admitting: Occupational Therapy

## 2012-05-13 LAB — GLUCOSE, CAPILLARY
Glucose-Capillary: 84 mg/dL (ref 70–99)
Glucose-Capillary: 104 mg/dL — ABNORMAL HIGH (ref 70–99)

## 2012-05-13 NOTE — Progress Notes (Signed)
Subjective/Complaints: Had a reasonable night. No new complaints. Pleased with progress so far during this admit A 12 point review of systems has been performed and if not noted above is otherwise negative.   Objective: Vital Signs: Blood pressure 106/69, pulse 114, temperature 99.2 F (37.3 C), temperature source Oral, resp. rate 17, height 5' 10"  (1.778 m), weight 63.7 kg (140 lb 6.9 oz), SpO2 97.00%. No results found.  Recent Labs  05/10/12 0842  WBC 14.1*  HGB 9.6*  HCT 28.6*  PLT PLATELET CLUMPS NOTED ON SMEAR, COUNT APPEARS ADEQUATE    Recent Labs  05/10/12 0842  NA 136  K 5.1  CL 102  GLUCOSE 140*  BUN 21  CREATININE 0.69  CALCIUM 8.5   CBG (last 3)   Recent Labs  05/12/12 1632 05/12/12 2106 05/13/12 0723  GLUCAP 131* 91 151*    Wt Readings from Last 3 Encounters:  05/13/12 63.7 kg (140 lb 6.9 oz)  05/07/12 66.2 kg (145 lb 15.1 oz)  04/18/12 71.94 kg (158 lb 9.6 oz)    Physical Exam:  Constitutional: He appears well-developed. He has a sickly appearance.   HENT:  Head: Normocephalic and atraumatic.  Eyes: Pupils are equal, round, and reactive to light.  Neck: Normal range of motion.  Cardiovascular: Regular rhythm. Tachycardia present.  HR 100's during the exam.  Pulmonary/Chest: Effort normal. No respiratory distress.  He has no wheezes.  Abdominal: He exhibits minimal distension. Bowel sounds are increased. There is tenderness.  Midline incision with retention sutures in place--dehisced incision with pink, clean wound bed. No odor and minimal drainage on dressing. ---closing nicely--central area still with depth Neurological: He is alert.   Basic insight and awareness. Was able to follow basic commands. Hoarse voice. 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 UE grossly  2to 3/5 LUE, 1-2/5 RUE with contracture in Hand (limited flexion). LE 1-2/5with weakness moreso in ankle dorsiflexion. DTR's 1+. Diffuse muscle wasting.  Skin: Skin is warm and  dry.  Psychiatric: relaxed  Assessment/Plan:  1. 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 Physiatrist is providing close team supervision and 24 hour management of active medical problems listed below. Physiatrist and rehab team continue to assess barriers to discharge/monitor patient progress toward functional and medical goals. FIM: FIM - Bathing Bathing Steps Patient Completed: Chest;Right Arm;Abdomen;Front perineal area;Right upper leg;Left upper leg Bathing: 3: Mod-Patient completes 5-7 6f10 parts or 50-74% (supine for LB in w/c for upper body)  FIM - Upper Body Dressing/Undressing Upper body dressing/undressing steps patient completed: Thread/unthread right sleeve of pullover shirt/dresss;Thread/unthread left sleeve of pullover shirt/dress;Put head through opening of pull over shirt/dress (in w/c) Upper body dressing/undressing: 4: Min-Patient completed 75 plus % of tasks (max encouragement) FIM - Lower Body Dressing/Undressing Lower body dressing/undressing: 1: Total-Patient completed less than 25% of tasks (assisted with all steps with max encouragement)  FIM - Toileting Toileting: 0: Activity did not occur  FIM - TAir cabin crewTransfers: 0-Activity did not occur  FIM - BControl and instrumentation engineerDevices: Sliding board Bed/Chair Transfer: 1: Mechanical lift  FIM - Locomotion: Wheelchair Locomotion: Wheelchair: 1: Total Assistance/staff pushes wheelchair (Pt<25%) FIM - Locomotion: Ambulation Ambulation/Gait Assistance: Not tested (comment) Locomotion: Ambulation: 0: Activity did not occur  Comprehension Comprehension Mode: Auditory Comprehension: 5-Follows basic conversation/direction: With no assist  Expression Expression Mode: Verbal Expression: 5-Expresses  basic needs/ideas: With no assist  Social Interaction Social Interaction Mode: Not assessed Social Interaction: 5-Interacts appropriately 90% of the time - Needs monitoring or encouragement for participation or interaction.  Problem Solving Problem Solving: 5-Solves basic 90% of the time/requires cueing < 10% of the time  Memory Memory: 5-Recognizes or recalls 90% of the time/requires cueing < 10% of the time  Medical Problem List and Plan:  1. DVT Prophylaxis/Anticoagulation: Mechanical: Sequential compression devices, below knee Bilateral lower extremities thrombocytopenia resolved.   2. Multifactorial pain: tylenol, local measures 3. Mood: neuropsych consult requested. Needs encouragement  -klonopin to continue 4. Neuropsych: This patient is not capable of making decisions on his/her own behalf.  5. FEN::   Serial labs, adjusting fluids, formula as well. Potassium slightly elevated--recheck Monday  -change to boluses at some point depending upon PO intake 6. Bilateral cardioembolic CVA: no blood thinners due to issues with bleeding?  7. ABLA: most recent hgb 9.6.  Monitor clinically. Aranesp, feso4--recheck monday 8. Critical illness neuropathy, severe muscle wasting  Protect heels, stretch heel cords  -AFO's at some point?  LOS (Days) 4 A FACE TO FACE EVALUATION WAS PERFORMED  SWARTZ,ZACHARY T 05/13/2012 7:33 AM

## 2012-05-13 NOTE — Progress Notes (Signed)
PRN tylenol given at 0001 for complaint of right shoulder pain. lidoderm patch removed per orders. Using urinal continently with assist from wife or staff, to place, hold, remove and empty. Meds given via PEG tube. 10cc residual from PEG. Ostomy emptied by wife at [place] for 128m. SCD's in place along with prevelon boots. Removed boots at 0330 per patient's request. Heels elevated off bed at that time.  Abdominal dressing C,D, & I.  Abdomen with mild distention. Wife reports patient performing "breathing exercises" during day. MPatrici RanksA

## 2012-05-13 NOTE — Progress Notes (Signed)
Occupational Therapy Session Note  Patient Details  Name: Connor Small MRN: [id number] Date of Birth: 09/04/1960  Today's Date: 05/13/2012 Time: 1430-1530 Time Calculation (min): 60 min  Short Term Goals: Week 1:  OT Short Term Goal 1 (Week 1): Self Feeding:  Patient will feed himself at least 50% of 2 meals per day during this recording period. OT Short Term Goal 2 (Week 1): Grooming:  Patient will perform 2 grooming tasks with min assist to include set up once items needed are provided OT Short Term Goal 3 (Week 1): UB Dressing:  Patient will don shirt in supported sit with max assist OT Short Term Goal 4 (Week 1): Sitting balance:  Patient will sit without back support with mod assist while completing simple BADL task at least 2 days this recording period. OT Short Term Goal 5 (Week 1): UE exercises:  Patient will tolerate BUE exercises at least 4 times during this recording period.  Skilled Therapeutic Interventions/Progress Updates:  Patient up in w/c and wife performing boosting technique to provide pressure relief to his buttocks upon arrival.  Engaged in washing hands at sink to include sitting upright without back support with supervision-min assist while reach to turn on water, apply soap and complete task of washing hands.  Assist to rub the built up dead skin off his hands.  Patient able to comb hair with LUE with back support. Performed BUE P/AA/AROM and resistance exercises while up in w/c then transferred back to bed via slide board with max assist and with patient practicing the task of guiding this clinician through the steps of the transfer.  Once back in bed, continued with RUE P/AA/AROM and resistance exercises with focus on finger flexion yet patient is limited by pain.  Wife observed and asking questions related to performing RUE exercises with her husband.  Therapy Documentation Precautions:  Precautions Precautions: Fall;Shoulder Type of Shoulder Precautions: right  shoulder subluxation  Precaution Comments:  ostomy, tachycardia, Afib, Wound vac, peg tube (doesn't wear abd binder anymore) Required Braces or Orthoses: Other Brace/Splint Other Brace/Splint: right wrist splint Restrictions Weight Bearing Restrictions: No Pain: Denies pain ADL: See FIM for current functional status  Therapy/Group: Individual Therapy  Allyssia Skluzacek 05/13/2012, 3:37 PM

## 2012-05-13 NOTE — Progress Notes (Signed)
Physical Therapy Note  Patient Details  Name: DEWARD SEBEK MRN: [id number] Date of Birth: 10/23/1960 Today's Date: 05/13/2012  Time: 800-855 55 minutes  No c/o pain. Pt rec'd in bed after receiving meds.  Supine to sit with max A, pt able to assist with moving B LEs out of bed. Sliding board transfer max-total A with +2 for safety.  Standing frame for LE strength and activity tolerance 3 x 2 minutes.  Pt required manual facilitation and assist for trunk control in standing, improved with repetition.  Pt required encouragement due to anxiety but willing to participate in standing activities.  AAROM R LE and AROM L LE performed in all planes.  Scooting A/P in chair multiple attempts with focus on pt participating in transfer, pt able to activate wt shifts and leaning, unable to assist with UEs or LEs at this time.  Individual therapy  Laylah Riga 05/13/2012, 8:54 AM

## 2012-05-14 ENCOUNTER — Inpatient Hospital Stay (HOSPITAL_COMMUNITY): Payer: BC Managed Care – PPO

## 2012-05-14 ENCOUNTER — Encounter (HOSPITAL_COMMUNITY): Payer: BC Managed Care – PPO

## 2012-05-14 ENCOUNTER — Inpatient Hospital Stay (HOSPITAL_COMMUNITY): Payer: BC Managed Care – PPO | Admitting: Occupational Therapy

## 2012-05-14 DIAGNOSIS — I6350 Cerebral infarction due to unspecified occlusion or stenosis of unspecified cerebral artery: Secondary | ICD-10-CM

## 2012-05-14 DIAGNOSIS — G8250 Quadriplegia, unspecified: Secondary | ICD-10-CM

## 2012-05-14 DIAGNOSIS — G7281 Critical illness myopathy: Secondary | ICD-10-CM

## 2012-05-14 LAB — GLUCOSE, CAPILLARY
Glucose-Capillary: 124 mg/dL — ABNORMAL HIGH (ref 70–99)
Glucose-Capillary: 95 mg/dL (ref 70–99)

## 2012-05-14 LAB — CBC
Hemoglobin: 8.4 g/dL — ABNORMAL LOW (ref 13.0–17.0)
MCH: 29.2 pg (ref 26.0–34.0)
MCV: 89.2 fL (ref 78.0–100.0)
RBC: 2.88 MIL/uL — ABNORMAL LOW (ref 4.22–5.81)
WBC: 12.7 10*3/uL — ABNORMAL HIGH (ref 4.0–10.5)

## 2012-05-14 LAB — BASIC METABOLIC PANEL
CO2: 25 mEq/L (ref 19–32)
Glucose, Bld: 153 mg/dL — ABNORMAL HIGH (ref 70–99)
Potassium: 3.6 mEq/L (ref 3.5–5.1)
Sodium: 134 mEq/L — ABNORMAL LOW (ref 135–145)

## 2012-05-14 MED ORDER — INSULIN ASPART 100 UNIT/ML ~~LOC~~ SOLN: 0.0000 [IU] | Freq: Every day | SUBCUTANEOUS | Status: DC

## 2012-05-14 MED ORDER — INSULIN ASPART 100 UNIT/ML ~~LOC~~ SOLN: 0.0000 [IU] | Freq: Three times a day (TID) | SUBCUTANEOUS | Status: DC

## 2012-05-14 MED ORDER — TRAMADOL HCL 50 MG PO TABS
50.0000 mg | ORAL_TABLET | Freq: Four times a day (QID) | ORAL | Status: DC | PRN
  Administered 2012-05-17 – 2012-06-05 (×25): 50 mg
  Filled 2012-05-14 (×27): qty 1

## 2012-05-14 MED ORDER — TRAMADOL 5 MG/ML ORAL SUSPENSION: 50.0000 mg | Freq: Four times a day (QID) | ORAL | Status: DC | PRN

## 2012-05-14 NOTE — Progress Notes (Signed)
Speech Language Pathology Daily Session Note  Patient Details  Name: Connor Small MRN: [id number] Date of Birth: 23-Jan-1961  Today's Date: 05/14/2012 Time: 6144-3154 Time Calculation (min): 25 min  Short Term Goals: Week 1: SLP Short Term Goal 1 (Week 1): Pt will demonstrate sustained attention to a functional task for 15 minutes with Min A verbal and question cues for redirection. SLP Short Term Goal 2 (Week 1): Pt will utilize external memory aids to recall new, daily information with Min A verbal cues.  SLP Short Term Goal 3 (Week 1): Pt will demonstrate functional problem solving for basic and familiar tasks with Min A verbal and question cues.  SLP Short Term Goal 4 (Week 1): Pt will perform vocal function exercises with Min A verbal cues.  SLP Short Term Goal 5 (Week 1): Pt will utilize swallowing compensatory strategies to minimize overt s/s of aspiration with Mod I.   Skilled Therapeutic Interventions: Treatment focus on cognitive goals. SLP facilitated session by providing Max verbal cues for intellectual awareness in regards to current short-term memory deficits and Max A verbal and visual cues for utilization of memory compensatory strategies with visual aids.  Pt's daughter present and verbalized understanding of current SLP goals of increased utilization of memory strategies and increased initiation of directing his care, however, the pt requires reinforcement.    FIM:  Comprehension Comprehension Mode: Auditory Comprehension: 4-Understands basic 75 - 89% of the time/requires cueing 10 - 24% of the time Expression Expression Mode: Verbal Expression: 4-Expresses basic 75 - 89% of the time/requires cueing 10 - 24% of the time. Needs helper to occlude trach/needs to repeat words. Social Interaction Social Interaction Mode: Not assessed Social Interaction: 3-Interacts appropriately 50 - 74% of the time - May be physically or verbally inappropriate. Problem Solving Problem  Solving Mode: Not assessed Problem Solving: 3-Solves basic 50 - 74% of the time/requires cueing 25 - 49% of the time Memory Memory Mode: Not assessed Memory: 2-Recognizes or recalls 25 - 49% of the time/requires cueing 51 - 75% of the time  Pain Pain Assessment Pain Assessment: No/denies pain  Therapy/Group: Individual Therapy  Coley Kulikowski 05/14/2012, 3:12 PM

## 2012-05-14 NOTE — Progress Notes (Signed)
Occupational Therapy Session Note  Patient Details  Name: Connor Small MRN: [id number] Date of Birth: [birth year]  Today's Date: 05/14/2012 Time: 8101-7510 Time Calculation (min): 25 min  Short Term Goals: Week 1:  OT Short Term Goal 1 (Week 1): Self Feeding:  Patient will feed himself at least 50% of 2 meals per day during this recording period. OT Short Term Goal 2 (Week 1): Grooming:  Patient will perform 2 grooming tasks with min assist to include set up once items needed are provided OT Short Term Goal 3 (Week 1): UB Dressing:  Patient will don shirt in supported sit with max assist OT Short Term Goal 4 (Week 1): Sitting balance:  Patient will sit without back support with mod assist while completing simple BADL task at least 2 days this recording period. OT Short Term Goal 5 (Week 1): UE exercises:  Patient will tolerate BUE exercises at least 4 times during this recording period.  Skilled Therapeutic Interventions/Progress Updates:    Patient found supine in bed and dressed. Patient stated his wife and daughter assisted him with dressing. Patient engaged in bed mobility and sat edge of bed for UE exercises, static sitting balance/tolerance/endurance, then edge of bed -> w/c slide board transfer. Therapists then positioned patient in w/c and propelled patient -> ADL apartment for bed mobility in/out of regular bed along with side rolling right <->left. Therapist educated patient on managing RUE during transfers and for safety. Educated patient's daughter on pressure relief while seated in w/c (tipping w/c back). At end of session left patient seated in w/c in dayroom with his daughter.  Focus on activity tolerance, bed mobility, sliding board transfers, and directing care/assistance.   Therapy Documentation Precautions:  Precautions Precautions: Fall;Shoulder Type of Shoulder Precautions: right shoulder subluxation  Precaution Comments:  ostomy, tachycardia, Afib, Wound vac, peg tube  (doesn't wear abd binder anymore) Required Braces or Orthoses: Other Brace/Splint Other Brace/Splint: right wrist splint Restrictions Weight Bearing Restrictions: No Pain: Pain Assessment Pain Assessment: No/denies pain  See FIM for current functional status  Therapy/Group: Co-Treatment with OTR  Leroy Libman 05/14/2012, 3:30 PM

## 2012-05-14 NOTE — Progress Notes (Signed)
Subjective/Complaints: No major issues. A little groggy from oxy given last night.  A 12 point review of systems has been performed and if not noted above is otherwise negative.   Objective: Vital Signs: Blood pressure 116/65, pulse 123, temperature 99.7 F (37.6 C), temperature source Oral, resp. rate 18, height 5' 10"  (1.778 m), weight 63.7 kg (140 lb 6.9 oz), SpO2 95.00%. No results found.  Recent Labs  05/14/12 0803  WBC 12.7*  HGB 8.4*  HCT 25.7*  PLT 397   No results found for this basename: NA, K, CL, CO, GLUCOSE, BUN, CREATININE, CALCIUM,  in the last 72 hours CBG (last 3)   Recent Labs  05/13/12 1656 05/13/12 2150 05/14/12 0717  GLUCAP 104* 112* 124*    Wt Readings from Last 3 Encounters:  05/13/12 63.7 kg (140 lb 6.9 oz)  05/07/12 66.2 kg (145 lb 15.1 oz)  04/18/12 71.94 kg (158 lb 9.6 oz)    Physical Exam:  Constitutional: He appears well-developed. He has a sickly appearance.   HENT:  Head: Normocephalic and atraumatic.  Eyes: Pupils are equal, round, and reactive to light.  Neck: Normal range of motion.  Cardiovascular: Regular rhythm. Tachycardia present.  HR 100's during the exam.  Pulmonary/Chest: Effort normal. No respiratory distress.  He has no wheezes.  Abdominal: He exhibits minimal distension. Bowel sounds are increased. There is tenderness.  Midline incision with retention sutures in place--dehisced incision with pink, clean wound bed. No odor and minimal drainage on dressing. ---closing nicely--central area still with depth Neurological: He is alert.   Basic insight and awareness. Was able to follow basic commands. Hoarse voice. 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 UE grossly  2to 3/5 LUE, 1-2/5 RUE with contracture in Hand (limited flexion). LE 1-2/5with weakness moreso in ankle dorsiflexion. DTR's 1+. Diffuse muscle wasting.  Skin: Skin is warm and dry.  Psychiatric: relaxed  Assessment/Plan:  1. Functional deficits secondary to  severe deconditioning, critical illness neuropathy/myopathy after multiple medical which require 3+ hours per day of interdisciplinary therapy in a comprehensive inpatient rehab setting. Physiatrist is providing close team supervision and 24 hour management of active medical problems listed below. Physiatrist and rehab team continue to assess barriers to discharge/monitor patient progress toward functional and medical goals. FIM: FIM - Bathing Bathing Steps Patient Completed: Chest;Right Arm;Abdomen;Front perineal area;Right upper leg;Left upper leg Bathing: 3: Mod-Patient completes 5-7 19f10 parts or 50-74% (supine for LB in w/c for upper body)  FIM - Upper Body Dressing/Undressing Upper body dressing/undressing steps patient completed: Thread/unthread right sleeve of pullover shirt/dresss;Thread/unthread left sleeve of pullover shirt/dress;Put head through opening of pull over shirt/dress (in w/c) Upper body dressing/undressing: 4: Min-Patient completed 75 plus % of tasks (max encouragement) FIM - Lower Body Dressing/Undressing Lower body dressing/undressing: 1: Total-Patient completed less than 25% of tasks (assisted with all steps with max encouragement)  FIM - Toileting Toileting: 0: Activity did not occur  FIM - TAir cabin crewTransfers: 0-Activity did not occur  FIM - BControl and instrumentation engineerDevices: Sliding board Bed/Chair Transfer: 3: Sit > Supine: Mod A (lifting assist/Pt. 50-74%/lift 2 legs);2: Chair or W/C > Bed: Max A (lift and lower assist)  FIM - Locomotion: Wheelchair Locomotion: Wheelchair: 1: Total Assistance/staff pushes wheelchair (Pt<25%) FIM - Locomotion: Ambulation Ambulation/Gait Assistance: Not tested (comment) Locomotion: Ambulation: 0: Activity did not occur  Comprehension Comprehension Mode: Auditory Comprehension: 5-Follows basic conversation/direction: With no assist  Expression Expression Mode: Verbal Expression:  5-Expresses basic needs/ideas: With no assist  Social Interaction Social Interaction Mode: Not assessed Social Interaction: 5-Interacts appropriately 90% of the time - Needs monitoring or encouragement for participation or interaction.  Problem Solving Problem Solving: 5-Solves basic problems: With no assist  Memory Memory: 5-Recognizes or recalls 90% of the time/requires cueing < 10% of the time  Medical Problem List and Plan:  1. DVT Prophylaxis/Anticoagulation: Mechanical: Sequential compression devices, below knee Bilateral lower extremities thrombocytopenia resolved.   2. Multifactorial pain: tylenol, local measures, use ultram for more severe pain 3. Mood: neuropsych consult requested. Needs encouragement  -klonopin to continue 4. Neuropsych: This patient is not capable of making decisions on his/her own behalf.  5. FEN::   Serial labs, adjusting fluids, formula as well. Potassium slightly elevated--recheck Monday  -change to boluses at some point depending upon PO intake 6. Bilateral cardioembolic CVA: no blood thinners due to issues with bleeding?  7. ABLA:  Aranesp, feso4--  -hgb down to 8.4 today  -recheck cbc tomorrow  -no signs of blood loss 8. 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 Protect heels, stretch heel cords  -AFO's at some point?  LOS (Days) 5 A FACE TO FACE EVALUATION WAS PERFORMED  Riot Waterworth T 05/14/2012 8:21 AM

## 2012-05-14 NOTE — Progress Notes (Signed)
[facility] Individual Statement of Services  Patient Name:  Connor Small  Date:  05/14/2012  Welcome to the [facility].  Our goal is to provide you with an individualized program based on your diagnosis and situation, designed to meet your specific needs.  With this comprehensive rehabilitation program, you will be expected to participate in at least 3 hours of rehabilitation therapies Monday-Friday, with modified therapy programming on the weekends.  Your rehabilitation program will include the following services:  Physical Therapy (PT), Occupational Therapy (OT), Speech Therapy (ST), 24 hour per day rehabilitation nursing, Therapeutic Recreaction (TR), Neuropsychology, Case Management ( Social Worker), Rehabilitation Medicine, Nutrition Services and Pharmacy Services  Weekly team conferences will be held on Tuesdays to discuss your progress.  Your Social Worker will talk with you frequently to get your input and to update you on team discussions.  Team conferences with you and your family in attendance may also be held.  Expected length of stay: 4 weeks  Overall anticipated outcome: moderate assistance at w/c  Depending on your progress and recovery, your program may change. Your Social Worker will coordinate services and will keep you informed of any changes. Your Social Worker's name and contact numbers are listed  below.  The following services may also be recommended but are not provided by the [facility] will be made to provide these services after discharge if needed.  Arrangements include referral to agencies that provide these services.  Your insurance has been verified to be:  [employer] [place] Your primary doctor is:  Dr. Anastasia Pall  Pertinent information will be shared with  your doctor and your insurance company.  Social Worker:  [place], [place] or ([phone]  Information discussed with and copy given to patient by: Lennart Pall, 05/14/2012, 5:04 PM

## 2012-05-14 NOTE — Progress Notes (Signed)
Physical Therapy Session Note  Patient Details  Name: Connor Small MRN: [id number] Date of Birth: 07-20-1960  Today's Date: 05/14/2012 Time: 0165-5374 Time Calculation (min): 58 min  Short Term Goals: Week 1:  PT Short Term Goal 1 (Week 1): Pt will perform bed mobility with total A of 1 person PT Short Term Goal 2 (Week 1): Pt will transfer bed <-> w/c  with max A with pt initiating and actively participating 25% of the time PT Short Term Goal 3 (Week 1): Pt wil be able to demonstrate dynamic sitting balance EOB with mod A for functional task x 5 min  Skilled Therapeutic Interventions/Progress Updates:    Session focused on overall activity tolerance, transfer training with slide board, dynamic sitting balance edge of mat, lateral leans onto elbow for weightbearing and to aid with transfers, and neuro re-ed in standing for postural control using standing frame and adding dynamic task to throw horseshoes with L hand. Pt requires encouragement for active participation in session. W/c propulsion with L hemi technique and mod A x 30' with rest breaks and cues.   Therapy Documentation Precautions:  Precautions Precautions: Fall;Shoulder Type of Shoulder Precautions: right shoulder subluxation  Precaution Comments:  ostomy, tachycardia, Afib, Wound vac, peg tube (doesn't wear abd binder anymore) Required Braces or Orthoses: Other Brace/Splint Other Brace/Splint: right wrist splint Restrictions Weight Bearing Restrictions: No   Pain: Pain Assessment Pain Assessment: No/denies pain  See FIM for current functional status  Therapy/Group: Individual Therapy  [name] [facility] 05/14/2012, 4:38 PM

## 2012-05-14 NOTE — Progress Notes (Signed)
NUTRITION FOLLOW UP  DOCUMENTATION CODES  Per approved criteria   -Severe malnutrition in the context of acute illness or injury    Intervention:   1. Continue Vital 1.5 at 80 ml/hr x 18 hours. Hold for therapies.  2. Initiate 36-hour calorie count, starting tomorrow morning- discussed with wife and daughter. Document percent consumed for each item on the patient's meal tray ticket and keep in envelope. Also document percent of any supplement or snack pt consumes and keep documentation in envelope for RD to review.  3. RD to continue to follow nutrition care plan.  Nutrition Dx:   Inadequate oral intake related to poor appetite, early satiety, and lethargy as evidenced by poor meal completion. Slightly improving.  Goal:   Intake to meet >90% of estimated nutrition needs. Met with enteral nutrition.  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 Pt with acute respiratory and renal failure with ARDS and septic shock. Pt recently discharged to CIR, however currently admitted as inpatient due to possible aspiration PNA (4/2).   Hospital course nutrition summary:  2/2 - 2/20: permissively underfed with EN  2/21-2/26: advanced to full feeds  2/26 - 3/3: feeds held for surgery  3/4 - 3/11: resumed full feeds  3/11 - 3/12: feeds held for GI bleed  3/13 - 3/24: received full feeds  3/24: TFs discontinued diet initiated; pt with very poor oral intake; tx to rehab 3/25  3/28 - 4/1: increased n/v - pt refusing almost all meals and supplements; wife declined calorie count, requesting TPN  4/2: NGT placed to suction, surgery ordering TF initiation; tx back to acute with plans for trickle feedings.  4/3-4/4: Trickle feeds of Vital 1.2  @ 30 mL/hr  4/5: PEG placement, resume of Vital 1.2  4/6-4/9: Inconsistent TF infusion and frequent adjustment of rate. Pt has not yet reached >40 mL/hr with tolerance. Wt now 151 lbs.  4/10-11: Pt has advanced as scheduled to 60 mL/hr. Discussed RN. Plan to increase to 70 mL this afternoon.  4/14: Pt has reached goal TF rate of 75 mL/hr with tolerance. PO intake remains insufficient with improvement per pt's report.  4/15: Pt ready to begin transition to cyclic feeds. RD to order Vital 1.2 @ 85 mL/hr x21 hrs daily. Will continue with daily advancements as tolerated and shorter duration of tubefeeds.  4/16: Pt agreeable to change formula to Vital 1.5 @ 80 mL/hr x 18 hrs daily.  4/17: Transfer to CIR. Continues with order of Vital 1.5 @ 80 ml/hr x 18 hrs daily.  Current regimen is Vital 1.5 at 80 ml/hr. 200 ml free water TID. This regimen provides: 2160 kcal, 97 grams protein, 1700 ml free water. RN reports pt is tolerating TF well at this time.  Discussed initiation of calorie count with wife via phone. Per wife, pt consumed toast, 1/2 chicken croissant, 1 small pizza slice, 1 chicken thigh and leg yesterday. RD unable to complete full calorie count on this information, however do believe that pt would benefit from calorie count at this time.  Height: Ht Readings from Last 1 Encounters:  05/09/12 5' 10"  (1.778 m)    Weight Status:   Wt Readings from Last 1 Encounters:  05/13/12 140 lb 6.9 oz (63.7 kg)  Wt declining - wife notes that pt is weighed with variable amounts of equipment on bed and clothing  Re-estimated needs:  Kcal: 2200 - 2400 Protein: 120 - 140 grams Fluid: > 2.2 liters  Skin: incisions, ileostomy, stage II pressure ulcer to R buttocks  Diet Order: General   Intake/Output Summary (Last 24 hours) at 05/14/12 1147 Last data filed at 05/14/12 1110  Gross per 24 hour  Intake   2080 ml  Output   1980 ml  Net    100 ml    Last BM: 4/21 (310 ml output so far today via  ostomy)   Labs:   Recent Labs Lab 05/08/12 0630 05/09/12 0635 05/10/12 0842 05/14/12 0803  NA 136  --  136 134*  K 3.6  --  5.1 3.6  CL 99  --  102 97  CO2 25  --  25 25  BUN 20  --  21 19  CREATININE 0.78  --  0.69 0.70  CALCIUM 8.4  --  8.5 8.5  MG  --  1.5  --   --   PHOS  --  3.9  --   --   GLUCOSE 109*  --  140* 153*    CBG (last 3)   Recent Labs  05/13/12 2150 05/14/12 0717 05/14/12 1132  GLUCAP 112* 124* 92    Scheduled Meds: . aspirin  81 mg Per Tube Daily  . chlorhexidine  15 mL Mouth Rinse BID  . clonazePAM  0.5 mg Oral QHS  . [START ON 05/15/2012] darbepoetin (ARANESP) injection - NON-DIALYSIS  40 mcg Subcutaneous Q Tue-1800  . ferrous sulfate  300 mg Per Tube TID WC  . free water  200 mL Per Tube Q3H  . insulin aspart  0-10 Units Subcutaneous TID WC  . insulin aspart  0-5 Units Subcutaneous QHS  . lidocaine  1 patch Transdermal Q24H  . lip balm   Topical BID  . loperamide  2 mg Per Tube QHS  . loperamide  2-4 mg Per Tube UD  . metoCLOPramide  10 mg Oral TID AC  . metoprolol tartrate  6.25 mg Per Tube Daily  . multivitamin  5 mL Per Tube Daily  . pantoprazole sodium  40 mg Per Tube Q1200  . saccharomyces boulardii  250 mg Oral BID    Continuous Infusions: . feeding supplement (VITAL 1.5 CAL) 1,000 mL (05/14/12 0427)    Inda Coke MS, RD, LDN Pager: [phone] After-hours pager: [phone]

## 2012-05-14 NOTE — Progress Notes (Signed)
Social Work Assessment and Plan Social Work Assessment and Plan  Patient Details  Name: Connor Small MRN: [id number] Date of Birth: 06-22-1960  Today's Date: 05/14/2012  Problem List:  Patient Active Problem List  Diagnosis  . history of UC (ulcerative colitis)  . Sinus tachycardia  . Hematuria  . Pleural effusion, L > R  . Leukocytosis, unspecified  . Hyperbilirubinemia, due to cholestasis  . PAF (paroxysmal atrial fibrillation)  . Anemia of chronic disease  . Quadriparesis  . S/p total colectomy for toxic megacolon, C diff  . Hypernatremia, improving  . Severe muscle deconditioning  . Shoulder subluxation, right  . 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 Coagulopathy  . Critical illness myopathy and neuropathy  . Acute blood loss anemia  . HAP (hospital-acquired pneumonia)  . Ileostomy in place  . Protein-calorie malnutrition, severe  . Spleen hematoma - subcapsular & without rupture   . Ascites s/p US guide paracentesis (4/2)  . Aspiration pneumonitis  . Clostridium difficile colitis  . Intra-abdominal fluid collection/LUQ  . CKD (chronic kidney disease) stage 2, GFR 60-89 ml/min  . Acute renal failure  . Hypokalemia   Past Medical History:  Past Medical History  Diagnosis Date  . Chronic diarrhea   . Rectal bleed   . Hemorrhoids   . Ulcerative colitis     Distal UC over 8 yrs ago diagnosed  . Diverticulitis of large intestine with perforation 10/2011    done at [facility]  . 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 history of UC (ulcerative colitis) 12/06/2010   Past Surgical History:  Past Surgical History  Procedure Laterality Date  . Colonoscopy  9/03  . Sigmoidoscopy       06/13/2002  . Temporary ostomy November 06, 2010      for a colon perforation that was done in [facility] (Dr Ruben Im).    . Colonoscopy  02/16/2011    Procedure: COLONOSCOPY;  Surgeon: Malissa Hippo, MD;  Location: [facility];  Service: Endoscopy;  Laterality: N/A;  100  .  Flexible sigmoidoscopy  02/27/2012    Procedure: FLEXIBLE SIGMOIDOSCOPY;  Surgeon: Malissa Hippo, MD;  Location: [facility];  Service: Endoscopy;  Laterality: N/A;  with colonic decompression  . Laparotomy  02/27/2012    Procedure: EXPLORATORY LAPAROTOMY;  Surgeon: Fabio Bering, MD;  Location: [facility] ORS;  Service: General;  Laterality: N/A;  . Cecostomy  02/27/2012    Procedure: CECOSTOMY;  Surgeon: Fabio Bering, MD;  Location: [facility] ORS;  Service: General;  Laterality: N/A;  Cecostomy Tube Placement  . Colectomy  02/28/2012    Procedure: TOTAL COLECTOMY;  Surgeon: Fabio Bering, MD;  Location: [facility] OR[facility];  Service: General;  Laterality: N/A;  . Ileostomy  02/28/2012    Procedure: ILEOSTOMY;  Surgeon: Fabio Bering, MD;  Location: [facility] ORS;  Service: General;  Laterality: N/A;  . Cystoscopy w/ ureteral stent placement Bilateral 03/20/2012    Procedure: CYSTOSCOPY WITH RETROGRADE PYELOGRAM/URETERAL STENT PLACEMENT;  Surgeon: Sebastian Ache, MD;  Location: [facility] OR;  Service: Urology;  Laterality: Bilateral;  . Laparotomy N/A 03/26/2012    Procedure: EXPLORATORY LAPAROTOMY;  Surgeon: Mariella Saa, MD;  Location: [facility] OR;  Service: General;  Laterality: N/A;  . Application of wound vac N/A 03/26/2012    Procedure: APPLICATION OF WOUND VAC;  Surgeon: Mariella Saa, MD;  Location: [facility] OR;  Service: General;  Laterality: N/A;  . Liver biopsy N/A 03/26/2012    Procedure: LIVER BIOPSY;  Surgeon: Mariella Saa, MD;  Location: [facility] OR;  Service: General;  Laterality: N/A;  . Laparotomy N/A 03/29/2012    Procedure: EXPLORATORY LAPAROTOMY, PARTIAL WOUND CLOSURE;  Surgeon: Mariella Saa, MD;  Location: [facility] OR;  Service: General;  Laterality: N/A;  . Vacuum assisted closure change N/A 03/29/2012    Procedure: Open ABDOMINAL VACUUM  CHANGE;  Surgeon: Mariella Saa, MD;  Location: [facility] OR;  Service: General;  Laterality: N/A;  . Vacuum assisted closure change N/A 04/01/2012    Procedure: removal of abdominal  vac dressing and abdominal closure;  Surgeon: Mariella Saa, MD;  Location: [facility] OR;  Service: General;  Laterality: N/A;   Social History:  reports that he quit smoking about 27 years ago. His smoking use included Cigarettes. He smoked 0.00 packs per day. He has never used smokeless tobacco. He reports that he does not drink alcohol or use illicit drugs.  Family / Support Systems Marital Status: Married Patient Roles: Parent;Other (Comment);Spouse (Employee) Spouse/Significant Other: wife, Connor Small @ [phone] or Petra Kuba Children: daughter (only child), Connor Small @ (C) [phone] - lives in [place] Other Supports: brothers, Connor Hua and Connor Small living in [place] and [place] Anticipated Caregiver: Wife and daughter Ability/Limitations of Caregiver: Wife currently on FMLA from her school and doe snot plan to go back this school year.  Daughter to take some time to assist Mom with Dad. Caregiver Availability: 24/7 Family Dynamics: Very supportive wife and daughter who have remained very involved throughout pt's hospitalization and plan to continue this at d/c.  Very encouraging to pt.  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 Legal Hisotry/Current Legal Issues: No issues Guardian/Conservator: None-according to MD pt is not capable of making his own decisions.  Will rely upon wife to make any decisions while here.   Abuse/Neglect Physical Abuse: Denies Verbal Abuse: Denies Sexual Abuse: Denies Exploitation of patient/patient's resources: Denies Self-Neglect: Denies  Emotional Status Pt's affect, behavior adn adjustment status: Pt does speak some, he wants to go home  and glad to be talking about this is the last step before going home.  Wife is very involved and participates in pt's care.  Wife reports: " He has always been so independent and wants to be again."  Pt and wife remain very optimistic about his overall recovery.  During interview, pt does repeat statements and occasionally appear disconnected from conversation but quickly regains eye contact.  Very concerned about when he will be able to get home.  Asks, "I'm not going to be here for months and months am I?"  Neuropsychological eval just prior to my visit - will monitor emotional adjustment issues as they arise. Recent Psychosocial Issues: Medical issues from dental procedure which all started this Pyschiatric History: None Substance Abuse History: None  Patient / Family Perceptions, Expectations & Goals Pt/Family understanding of illness & functional limitations: Wife is able to explain pt's multiple medical procedures and treatment plan.  She has been staying here with him and never has left.  Pt listens and responds at times but allows wife to answer for him.  Will continue to assess pt's understanding. Premorbid pt/family roles/activities: Father, Husband, Employee, Home owner, [employer], etc Anticipated changes in roles/activities/participation:  Wife and daughter will need to assume careigiver roles for pt. Wife notes "I'm having to learn alot.... he used to even pump my gas for me!" Pt/family expectations/goals: Pt states: ' I want to get better and go home."  Wife states: " I am hopeful he can get back as much as he can to be as independent as possible."  She states: " I will do whatever I can to assist him."  [employer]: None Premorbid Home Care/DME Agencies: None Transportation available at discharge: family Resource referrals recommended: Support group (specify);Neuropsychology (CVA Support group)  Discharge Planning Living Arrangements: Spouse/significant  other Support Systems: Spouse/significant other;Children;Other relatives;Friends/neighbors;Church/faith community Type of Residence: Private residence Insurance Resources: Media planner (specify) Herbalist) Financial Resources: Employment;Family Support;Other (Comment) (STD now thru [employer]) Financial Screen Referred: No Living Expenses: Lives with family Money Management: Patient;Spouse Do you have any problems obtaining your medications?: No Home Management: Wife Patient/Family Preliminary Plans: Return home with wife providing assistance.  Daughter to assist also once goes home.  All are hopeful he will do well here and recover as much as he can. Social Work Anticipated Follow Up Needs: HH/OP;Support Group Expected length of stay: 4 weeks  Clinical Impression Very pleasant gentleman here on CIR (2nd admit) after lengthy hospitalization and medical course.  Some attentional difficulty at times.  Very focused on how long he will need to be here.  Wife and daughter at bedside and very encouraging.  Will monitor emotional adjustment as he progresses and assist with d/c plan.  Ariahna Smiddy 05/14/2012, 5:51 PM

## 2012-05-14 NOTE — Progress Notes (Signed)
Occupational Therapy Session Note  Patient Details  Name: Connor Small MRN: [id number] Date of Birth: 10/04/1960  Today's Date: 05/14/2012 Time:1030-1100   Time Calculation (min): 30 min Co-Treatment Time =1000-1100 - 60 Minutes  Short Term Goals: Week 1:  OT Short Term Goal 1 (Week 1): Self Feeding:  Patient will feed himself at least 50% of 2 meals per day during this recording period. OT Short Term Goal 2 (Week 1): Grooming:  Patient will perform 2 grooming tasks with min assist to include set up once items needed are provided OT Short Term Goal 3 (Week 1): UB Dressing:  Patient will don shirt in supported sit with max assist OT Short Term Goal 4 (Week 1): Sitting balance:  Patient will sit without back support with mod assist while completing simple BADL task at least 2 days this recording period. OT Short Term Goal 5 (Week 1): UE exercises:  Patient will tolerate BUE exercises at least 4 times during this recording period.  Skilled Therapeutic Interventions/Progress Updates:  Patient found supine in bed and dressed. Patient stated his wife and daughter assisted him with dressing. Patient engaged in bed mobility and sat edge of bed for UE exercises, static sitting balance/tolerance/endurance, then edge of bed -> w/c slide board transfer. Therapists then positioned patient in w/c and propelled patient -> ADL apartment for bed mobility in/out of regular bed along with side rolling right <->left. Therapist educated patient on managing RUE during transfers and for safety. Educated patient's daughter on pressure relief while seated in w/c (tipping w/c back). At end of session left patient seated in w/c in dayroom with his daughter.   Precautions:  Precautions Precautions: Fall;Shoulder Type of Shoulder Precautions: right shoulder subluxation  Precaution Comments:  ostomy, tachycardia, Afib, Wound vac, peg tube (doesn't wear abd binder anymore) Required Braces or Orthoses: Other  Brace/Splint Other Brace/Splint: right wrist splint Restrictions Weight Bearing Restrictions: No  See FIM for current functional status  Therapy/Group: Co-Treatment with COTA  Serayah Yazdani 05/14/2012, 12:13 PM

## 2012-05-15 ENCOUNTER — Inpatient Hospital Stay (HOSPITAL_COMMUNITY): Payer: BC Managed Care – PPO | Admitting: Speech Pathology

## 2012-05-15 ENCOUNTER — Inpatient Hospital Stay (HOSPITAL_COMMUNITY): Payer: BC Managed Care – PPO | Admitting: Occupational Therapy

## 2012-05-15 ENCOUNTER — Inpatient Hospital Stay (HOSPITAL_COMMUNITY): Payer: BC Managed Care – PPO

## 2012-05-15 LAB — GLUCOSE, CAPILLARY
Glucose-Capillary: 137 mg/dL — ABNORMAL HIGH (ref 70–99)
Glucose-Capillary: 87 mg/dL (ref 70–99)
Glucose-Capillary: 75 mg/dL (ref 70–99)

## 2012-05-15 LAB — URINALYSIS, ROUTINE W REFLEX MICROSCOPIC
Glucose, UA: NEGATIVE mg/dL
Hgb urine dipstick: NEGATIVE
Ketones, ur: NEGATIVE mg/dL
Protein, ur: 30 mg/dL — AB

## 2012-05-15 LAB — CBC
HCT: 24.8 % — ABNORMAL LOW (ref 39.0–52.0)
Hemoglobin: 8.4 g/dL — ABNORMAL LOW (ref 13.0–17.0)
MCHC: 33.9 g/dL (ref 30.0–36.0)
WBC: 12.2 10*3/uL — ABNORMAL HIGH (ref 4.0–10.5)

## 2012-05-15 MED ORDER — CIPROFLOXACIN HCL 500 MG PO TABS
500.0000 mg | ORAL_TABLET | Freq: Two times a day (BID) | ORAL | Status: DC
  Administered 2012-05-15 – 2012-05-16 (×3): 500 mg via ORAL
  Filled 2012-05-15 (×5): qty 1

## 2012-05-15 NOTE — Progress Notes (Signed)
Subjective/Complaints: Low grade temp, dysuria. A little restless last night..  A 12 point review of systems has been performed and if not noted above is otherwise negative.   Objective: Vital Signs: Blood pressure 115/73, pulse 112, temperature 99 F (37.2 C), temperature source Oral, resp. rate 18, height 5' 10"  (1.778 m), weight 68.402 kg (150 lb 12.8 oz), SpO2 98.00%. No results found.  Recent Labs  05/14/12 0803 05/15/12 0645  WBC 12.7* 12.2*  HGB 8.4* 8.4*  HCT 25.7* 24.8*  PLT 397 392    Recent Labs  05/14/12 0803  NA 134*  K 3.6  CL 97  GLUCOSE 153*  BUN 19  CREATININE 0.70  CALCIUM 8.5   CBG (last 3)   Recent Labs  05/14/12 1132 05/14/12 1704 05/14/12 2102  GLUCAP 92 100* 95    Wt Readings from Last 3 Encounters:  05/15/12 68.402 kg (150 lb 12.8 oz)  05/07/12 66.2 kg (145 lb 15.1 oz)  04/18/12 71.94 kg (158 lb 9.6 oz)    Physical Exam:  Constitutional: He appears well-developed. He has a sickly appearance.   HENT:  Head: Normocephalic and atraumatic.  Eyes: Pupils are equal, round, and reactive to light.  Neck: Normal range of motion.  Cardiovascular: Regular rhythm. Tachycardia present.  HR 100's during the exam.  Pulmonary/Chest: Effort normal. No respiratory distress.  He has no wheezes.  Abdominal: He exhibits minimal distension. Bowel sounds are increased. There is tenderness.  Midline incision with retention sutures in place--dehisced incision with pink, clean wound bed. No odor and minimal drainage on dressing. ---closing nicely--central area still with depth Neurological: He is alert.   Basic insight and awareness. Was able to follow basic commands. Hoarse voice. 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 UE grossly  2to 3/5 LUE, 1-2/5 RUE with contracture in Hand (limited flexion). LE 1-2/5with weakness moreso in ankle dorsiflexion. DTR's 1+. Diffuse muscle wasting.  Skin: Skin is warm and dry.  Psychiatric: relaxed  Assessment/Plan:   1. 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 Physiatrist is providing close team supervision and 24 hour management of active medical problems listed below. Physiatrist and rehab team continue to assess barriers to discharge/monitor patient progress toward functional and medical goals. FIM: FIM - Bathing Bathing Steps Patient Completed: Chest;Right Arm;Abdomen;Front perineal area;Right upper leg;Left upper leg Bathing: 3: Mod-Patient completes 5-7 50f10 parts or 50-74% (supine for LB in w/c for upper body)  FIM - Upper Body Dressing/Undressing Upper body dressing/undressing steps patient completed: Thread/unthread right sleeve of pullover shirt/dresss;Thread/unthread left sleeve of pullover shirt/dress;Put head through opening of pull over shirt/dress (in w/c) Upper body dressing/undressing: 4: Min-Patient completed 75 plus % of tasks (max encouragement) FIM - Lower Body Dressing/Undressing Lower body dressing/undressing: 1: Total-Patient completed less than 25% of tasks (assisted with all steps with max encouragement)  FIM - Toileting Toileting: 0: Activity did not occur  FIM - TAir cabin crewTransfers: 0-Activity did not occur  FIM - BControl and instrumentation engineerDevices: Sliding board Bed/Chair Transfer: 2: Chair or W/C > Bed: Max A (lift and lower assist);2: Bed > Chair or W/C: Max A (lift and lower assist)  FIM - Locomotion: Wheelchair Locomotion: Wheelchair: 1: Travels less than 50 ft with moderate assistance (Pt: 50 - 74%) FIM - Locomotion: Ambulation Ambulation/Gait Assistance: Not tested (comment) Locomotion: Ambulation: 0: Activity did not occur  Comprehension Comprehension Mode:  Auditory Comprehension: 4-Understands basic 75 - 89% of the time/requires cueing 10 - 24% of  the time  Expression Expression Mode: Verbal Expression: 4-Expresses basic 75 - 89% of the time/requires cueing 10 - 24% of the time. Needs helper to occlude trach/needs to repeat words.  Social Interaction Social Interaction Mode: Not assessed Social Interaction: 3-Interacts appropriately 50 - 74% of the time - May be physically or verbally inappropriate.  Problem Solving Problem Solving Mode: Not assessed Problem Solving: 3-Solves basic 50 - 74% of the time/requires cueing 25 - 49% of the time  Memory Memory Mode: Not assessed Memory: 2-Recognizes or recalls 25 - 49% of the time/requires cueing 51 - 75% of the time  Medical Problem List and Plan:  1. DVT Prophylaxis/Anticoagulation: Mechanical: Sequential compression devices, below knee Bilateral lower extremities thrombocytopenia resolved.   2. Multifactorial pain: tylenol, local measures, use ultram for more severe pain 3. Mood: neuropsych consult requested. Needs encouragement  -klonopin to continue 4. Neuropsych: This patient is not capable of making decisions on his/her own behalf.  5. FEN::   Serial labs, adjusting fluids, formula as well. Potassium slightly elevated--recheck Monday  -change to boluses at some point depending upon PO intake 6. Bilateral cardioembolic CVA: no blood thinners due to issues with bleeding?  7. ABLA:  Aranesp, feso4--  -hgb remains at 8.4  -recheck cbc later this week  -no signs of blood loss 8. 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 Protect heels, stretch heel cords  -AFO's at some point? 9. Low grade temp- check urine  -start empiric cipro once collected  LOS (Days) 6 A FACE TO FACE EVALUATION WAS PERFORMED  Sophiarose Eades T 05/15/2012 7:38 AM

## 2012-05-15 NOTE — Consult Note (Signed)
Ostomy follow-up: Wife at bedside states she applied new pouch and barrier ring yesterday without assistance.  She denies further questions regarding pouching routines or ordering supplies and states she feels comfortable emptying.  Supplies at bedside for wife or staff nurse use.  Stoma red and viable, flush with skin level, mod brown liquid stool in pouch.  Pt is familiar with pouching since he previously had an ostomy which was reversed prior to this admission.  He has been placed on [facility] discharge program. Please re-consult if further assistance is needed.  Thank-you,  Julien Girt MSN, [place], [place], [place], [place]

## 2012-05-15 NOTE — Progress Notes (Signed)
Physical Therapy Session Note  Patient Details  Name: Connor Small MRN: [id number] Date of Birth: Sep 19, 1960  Today's Date: 05/15/2012 Time: 1300-1400 Time Calculation (min): 60 min  Short Term Goals: Week 1:  PT Short Term Goal 1 (Week 1): Pt will perform bed mobility with total A of 1 person PT Short Term Goal 2 (Week 1): Pt will transfer bed <-> w/c  with max A with pt initiating and actively participating 25% of the time PT Short Term Goal 3 (Week 1): Pt wil be able to demonstrate dynamic sitting balance EOB with mod A for functional task x 5 min  Skilled Therapeutic Interventions/Progress Updates:   Session focused on transfer training with slideboard (total A) with emphasis on pt listing and directing steps for transfer with mod cues needed, neuro re-ed for trunk control, balance, and propped on elbow <-> sitting up to R and L and scooting to R/L on edge of mat x 5 reps each, bed mobility retraining, and education with wife on LE strengthening HEP for in the room including heel slides, hip abduction, bridges with 5 second hold, and SAQ x 10 reps each (AAROM). Mod A w/c propulsion x 25' with L hemi-technique. Pt continues to require max encouragement to initiate and actively try to participate, still quick to say "I can't."  Therapy Documentation Precautions:  Precautions Precautions: Fall;Shoulder Type of Shoulder Precautions: right shoulder subluxation  Precaution Comments:  ostomy, tachycardia, Afib, Wound vac, peg tube (doesn't wear abd binder anymore) Required Braces or Orthoses: Other Brace/Splint Other Brace/Splint: right wrist splint Restrictions Weight Bearing Restrictions: No   Pain: Pain with stretching/mobility - repositioned as able.   See FIM for current functional status  Therapy/Group: Individual Therapy  [name] [facility] 05/15/2012, 2:20 PM

## 2012-05-15 NOTE — Progress Notes (Signed)
Social Work Patient ID: Connor Small, male   DOB: February 09, 1960, 52 y.o.   MRN: [id number]   Met with patient and wife following team conference.  Both aware of and agreeable with targeted d/c date of 06/06/12 with moderate assist w/c level goals.  Explained in general terms what a moderate assist level could look like for caregiver.  Encouraged pt and wife to continue pushing his level of independence to decrease the physical burden on his wife.  Wife very attentive and engaged with me on this discussion.  Pt remains passive and, at one point, asks "What size is my wheelchair going to be?"  After estimating the size and answering his question, had to redirect him to the current topic of therapy participation.  Will continue to follow to monitor pt's progress and participation and to provide support for wife to step back a little.  Brentley Landfair, LCSW

## 2012-05-15 NOTE — Progress Notes (Signed)
Occupational Therapy Session Note  Patient Details  Name: Connor Small MRN: [id number] Date of Birth: 29-Apr-1960  Today's Date: 05/15/2012 Time: 1100-1200 Time Calculation (min): 60 min  Short Term Goals: Week 1:  OT Short Term Goal 1 (Week 1): Self Feeding:  Patient will feed himself at least 50% of 2 meals per day during this recording period. OT Short Term Goal 2 (Week 1): Grooming:  Patient will perform 2 grooming tasks with min assist to include set up once items needed are provided OT Short Term Goal 3 (Week 1): UB Dressing:  Patient will don shirt in supported sit with max assist OT Short Term Goal 4 (Week 1): Sitting balance:  Patient will sit without back support with mod assist while completing simple BADL task at least 2 days this recording period. OT Short Term Goal 5 (Week 1): UE exercises:  Patient will tolerate BUE exercises at least 4 times during this recording period.  Skilled Therapeutic Interventions/Progress Updates:  Patient found supine in bed, dressed. Patient's wife and daughter present during entire session. Focused skilled intervention on education regarding UE AAROM exercises, UE PROM/stretching exercises, LE AAROM exercises, LE PROM/stretching exercises, bed mobility, edge of bed -> w/c slide board transfer, and w/c positioning. Also focused on overall activity tolerance/endurance and functional use of bilateral UEs. At end of session left patient seated in w/c with towels and pillows assisting with positioning, wife and daughter present in room.   Precautions:  Precautions Precautions: Fall;Shoulder Type of Shoulder Precautions: right shoulder subluxation  Precaution Comments:  ostomy, tachycardia, Afib, Wound vac, peg tube (doesn't wear abd binder anymore) Required Braces or Orthoses: Other Brace/Splint Other Brace/Splint: right wrist splint Restrictions Weight Bearing Restrictions: No  See FIM for current functional status  Therapy/Group: Individual  Therapy  Mellie Buccellato 05/15/2012, 12:13 PM

## 2012-05-15 NOTE — Consult Note (Signed)
05/14/12  NEUROBEHAVIORAL STATUS EXAM - [facility] [facility]   MEDICAL NECESSITY:  [name] was seen on the [facility] for a neurobehavioral status exam owing to the patient's diagnosis of CVA and deconditioning.   According to medical records, [name] was admitted to the rehab unit owing to functional deficits owing to severe deconditioning following CVA and critical illness. Recent neuroimaging reportedly revealed scattered small acute to subacute infarcts in both of the cerebral hemispheres, brainstem, and right cerebellum.   Upon arrival to [name]'s room, he was sitting quietly and his daughter was sitting on the opposite side of the room reading a book. He was soft-spoken and friendly. He was cooperative and happy to participate in the appointment. He denied experiencing any cognitive difficulties but he acknowledged suffering from intermittent depression and anxiety. In fact, as he mentioned feeling this way he became tearful and choked up for a moment.   The patient reported feeling very comfortable with his treatment team and he believes that he is in good hands. He has a great social support system that at least includes his wife and daughter. He also feels that he is making some positive strides in therapy and has an optimistic outlook on his situation.    PROCEDURES ADMINISTERED: [2 units [id number] on 05/14/12] Diagnostic clinical interview  Review of available records Mental Status Exam-2 (brief version) BDI-Fast Screen   MENTAL STATUS: [name] mental status exam score was below the cutoff used to indicate cognitive impairment. While he could immediately register 3 words into memory, he could not recall any of them after a very short delay. He was generally oriented to time and place.   Emotional & Behavioral Evaluation: [name] was appropriately dressed for season and situation, and he appeared  well-groomed; though he seems quite ill looking. Normal posture was noted. He was friendly and rapport easily established. His speech was as expected and he was able to express ideas effectively. He seemed to understand test directions readily. His affect was appropriately modulated but he seemed depressed. Attention and motivation were good.   From an emotional standpoint, on a brief self-report inventory, [name] endorsed experiencing a mild degree of depressive symptoms. Suicidal/homicidal ideation, plan or intent was denied. No manic or hypomanic episodes were reported. The patient denied ever experiencing any auditory/visual hallucinations. No major behavioral or personality changes were endorsed.    Overall, there are indications that [name] is experiencing a degree of cognitive dysfunction, and he is at least suffering from a mild adjustment reaction given his present medical situation. Fortunately, he has a great social support system and he is optimistic and hopeful about his recovery.   RECOMMENDATIONS  Recommendations for treatment team:    Since emotional factors are adversely impacting the patient's daily life, consider implementing an antidepressant. Brief counseling and ego support may also be beneficial during this admission.     Consider referring for cognitive testing before discharge if the patient's energy level improves.   REFERRING DIAGNOSIS: CVA  FINAL DIAGNOSES:  CVA Adjustment disorder with depression    Rutha Bouchard, Psy.D.  Clinical Neuropsychologist

## 2012-05-15 NOTE — Patient Care Conference (Signed)
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 Aspiration pneumonitis 04/26/2012  . Clostridium difficile colitis 04/26/2012  . Intra-abdominal fluid collection/LUQ 04/26/2012  . CKD (chronic kidney disease) stage 2, GFR 60-89 ml/min 04/26/2012  . Acute renal failure 04/26/2012  . Hypokalemia 04/26/2012  . Protein-calorie malnutrition, severe 04/25/2012  . Spleen hematoma - subcapsular & without rupture  04/25/2012  . Ascites s/p US guide paracentesis (4/2) 04/25/2012  . Ileostomy in place 04/24/2012  . HAP (hospital-acquired pneumonia) 04/21/2012  . 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 Acute blood loss anemia 04/18/2012  . S/p total colectomy for toxic megacolon, C diff 03/23/2012  . Hypernatremia, improving 03/23/2012  . Severe muscle deconditioning 03/23/2012  . Shoulder subluxation, right 03/23/2012  . 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 Coagulopathy 03/23/2012  . Quadriparesis 03/17/2012  . Leukocytosis, unspecified 03/15/2012  . Hyperbilirubinemia, due to cholestasis 03/15/2012  . PAF (paroxysmal atrial fibrillation) 03/15/2012  . Anemia of chronic disease 03/15/2012  . Pleural effusion, L > R 03/04/2012  . Sinus tachycardia 02/28/2012  . Hematuria 02/28/2012  . history of UC (ulcerative colitis) 12/06/2010    Expected Discharge Date: Expected Discharge Date:  06/06/12  Team Members Present: Physician leading conference: Dr. Faith Rogue Social Worker Present: Amada Jupiter, LCSW Nurse Present: Rosalio Macadamia, RN PT Present: Karolee Stamps, PT;Other (comment) Sherrine Maples, PT) OT Present: Edwin Cap, OT Other (Discipline and Name): Tora Duck, PPS Coordinator     Current Status/Progress Goal Weekly Team Focus  Medical   returns after transfer with increase ascites multiple medical. severe deconditioning with critical illness myopathy and neuropathy  slow progression of goals and therapy, increase activity tolerance and rom  pain mgt, increase activity levels. attention and memory improvement,    Bowel/Bladder   cont of bladder/ ileostomy  Remain continent of bladder  remain con of bladder   Swallow/Nutrition/ Hydration             ADL's   overall total assist -> total assist X2; patient is limited by pain, overall tightness, and anxiety  overall mod assist; supervision for grooming tasks, self-feeding tasks, and dynamic sitting balance  dynamic sitting balance/tolerance/endurance, functional use of BUEs, ROM -> BUEs, self-feeding, grooming tasks, UB/LB dressing, family education   Mobility   max A to total A +2  mod A basic transfers w/c level; min A w/c mobility  activity tolerance, sitting balance, postural control, transfer trainging, functional strengthening   Communication             Safety/Cognition/ Behavioral Observations  Min-Mod A  Mod I  initiation, attention, working memory    Pain   denied pain at this time  less or equal to 3  less or equal to 3   Skin   ABD wound with retention sutures, daily dressing changes  No further skin breakdown/infection  free of  skin infection and breakdown    Rehab Goals Patient on target to meet rehab goals: Yes *See Care Plan and progress notes for long and short-term goals.  Barriers to Discharge: profound deficits, pain, learned dependence    Possible Resolutions to Barriers:   family ed, continued strength and cognitive training, adaptive equipment    Discharge Planning/Teaching Needs:  Home with wife and daughter who will provide 24/7 assistance      Team Discussion:  Remains very weak and requiring max encouragement to be as independent as possible with tasks.  Wife has "stepped back" a bit, but remains very supportive.  Slight improvement in his ability to direct his own care.  ST addressing attentional issues.  All team attempting to address his "learned helplessness".  Keep on 15/7 schedule for now.    Revisions to Treatment Plan:  None   Continued Need for Acute Rehabilitation Level of Care: The patient requires daily medical management by a physician with specialized training in physical medicine and rehabilitation for the following conditions: Daily direction of a multidisciplinary physical rehabilitation program to ensure safe treatment while eliciting the highest outcome that is of practical value to the patient.: Yes Daily medical management of patient stability for increased activity during participation in an intensive rehabilitation regime.: Yes Daily analysis of laboratory values and/or radiology reports with any subsequent need for medication adjustment of medical intervention for : Post surgical problems;Neurological problems;Other (GI/ feeding issues)  [name] 05/15/2012, 5:00 PM

## 2012-05-15 NOTE — Progress Notes (Signed)
NUTRITION FOLLOW UP  DOCUMENTATION CODES  Per approved criteria   -Severe malnutrition in the context of acute illness or injury    Intervention:   1. Continue Vital 1.5 at 80 ml/hr x 18 hours. Hold for therapies.  2. Initiate 36-hour calorie count, starting this morning- discussed with wife, RN and daughter. Document percent consumed for each item on the patient's meal tray ticket and keep in envelope. Also document percent of any supplement or snack pt consumes and keep documentation in envelope for RD to review.  3. RD to continue to follow nutrition care plan.  Nutrition Dx:   Inadequate oral intake related to poor appetite, early satiety, and lethargy as evidenced by poor meal completion. Slightly improving.  Goal:   Intake to meet >90% of estimated nutrition needs. Met with enteral nutrition.  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 Pt with acute respiratory and renal failure with ARDS and septic shock. Pt recently discharged to CIR, however currently admitted as inpatient due to possible aspiration PNA (4/2).   Hospital course nutrition summary:  2/2 - 2/20: permissively underfed with EN  2/21-2/26: advanced to full feeds  2/26 - 3/3: feeds held for surgery  3/4 - 3/11: resumed full feeds  3/11 - 3/12: feeds held for GI bleed  3/13 - 3/24: received full feeds  3/24: TFs discontinued diet initiated; pt with very poor oral intake; tx to rehab 3/25  3/28 - 4/1: increased n/v - pt refusing almost all meals and supplements; wife declined calorie count, requesting TPN  4/2: NGT placed to suction, surgery ordering TF initiation; tx back to acute with plans for trickle feedings.  4/3-4/4: Trickle feeds of Vital 1.2  @ 30 mL/hr  4/5: PEG placement, resume of Vital 1.2  4/6-4/9: Inconsistent TF infusion and frequent adjustment of rate. Pt has not yet reached >40 mL/hr with tolerance. Wt now 151 lbs.  4/10-11: Pt has advanced as scheduled to 60 mL/hr. Discussed RN. Plan to increase to 70 mL this afternoon.  4/14: Pt has reached goal TF rate of 75 mL/hr with tolerance. PO intake remains insufficient with improvement per pt's report.  4/15: Pt ready to begin transition to cyclic feeds. RD to order Vital 1.2 @ 85 mL/hr x21 hrs daily. Will continue with daily advancements as tolerated and shorter duration of tubefeeds.  4/16: Pt agreeable to change formula to Vital 1.5 @ 80 mL/hr x 18 hrs daily.  4/17: Transfer to CIR. Continues with order of Vital 1.5 @ 80 ml/hr x 18 hrs daily. 4/21: Initiation of 3-day calorie count; continue Vital 1.5 @ 80 ml/hr x 18 hrs daily.  Current regimen is Vital 1.5 at 80 ml/hr. 200 ml free water TID. This regimen provides: 2160 kcal, 97 grams protein, 1700 ml free water. RN reports pt is tolerating TF well at this time.  Discussed initiation of calorie count with wife. We reviewed how to document calorie count information. This RD also discussed with RN to help provide further accurate documentation.  Noted pt with low-grade temp; checking urine at this time for UTI per RN.  Height: Ht Readings from Last 1 Encounters:  05/09/12 5' 10"  (1.778 m)    Weight Status:   Wt Readings from Last 1 Encounters:  05/15/12 150 lb 12.8 oz (68.402 kg)  Wt variable  Re-estimated needs:  Kcal: 2200 - 2400 Protein: 120 - 140 grams Fluid: > 2.2 liters  Skin: incisions, ileostomy, stage II pressure ulcer to R buttocks  Diet Order: General   Intake/Output Summary (Last 24 hours) at 05/15/12 0933 Last data filed at 05/15/12 0900  Gross per 24 hour  Intake    960 ml  Output   2840 ml  Net  -1880 ml    Last BM: 4/21 (860 ml output yesterday via ostomy)   Labs:   Recent Labs Lab  05/09/12 0635 05/10/12 0842 05/14/12 0803  NA  --  136 134*  K  --  5.1 3.6  CL  --  102 97  CO2  --  25 25  BUN  --  21 19  CREATININE  --  0.69 0.70  CALCIUM  --  8.5 8.5  MG 1.5  --   --   PHOS 3.9  --   --   GLUCOSE  --  140* 153*    CBG (last 3)   Recent Labs  05/14/12 1132 05/14/12 1704 05/14/12 2102  GLUCAP 92 100* 95    Scheduled Meds: . aspirin  81 mg Per Tube Daily  . chlorhexidine  15 mL Mouth Rinse BID  . ciprofloxacin  500 mg Oral BID  . clonazePAM  0.5 mg Oral QHS  . darbepoetin (ARANESP) injection - NON-DIALYSIS  40 mcg Subcutaneous Q Tue-1800  . ferrous sulfate  300 mg Per Tube TID WC  . free water  200 mL Per Tube Q3H  . insulin aspart  0-10 Units Subcutaneous TID WC  . insulin aspart  0-5 Units Subcutaneous QHS  . lidocaine  1 patch Transdermal Q24H  . lip balm   Topical BID  . loperamide  2 mg Per Tube QHS  . loperamide  2-4 mg Per Tube UD  . metoCLOPramide  10 mg Oral TID AC  . metoprolol tartrate  6.25 mg Per Tube Daily  . multivitamin  5 mL Per Tube Daily  . pantoprazole sodium  40 mg Per Tube Q1200  . saccharomyces boulardii  250 mg Oral BID    Continuous Infusions: . feeding supplement (VITAL 1.5 CAL) 1,000 mL (05/14/12 0427)    Inda Coke MS, RD, LDN Pager: [phone] After-hours pager: [phone]

## 2012-05-15 NOTE — Progress Notes (Signed)
Speech Language Pathology Daily Session Note  Patient Details  Name: Connor Small MRN: [id number] Date of Birth: November 09, 1960  Today's Date: 05/15/2012 Time: 6438-3779 Time Calculation (min): 30 min  Short Term Goals: Week 1: SLP Short Term Goal 1 (Week 1): Pt will demonstrate sustained attention to a functional task for 15 minutes with Min A verbal and question cues for redirection. SLP Short Term Goal 2 (Week 1): Pt will utilize external memory aids to recall new, daily information with Min A verbal cues.  SLP Short Term Goal 3 (Week 1): Pt will demonstrate functional problem solving for basic and familiar tasks with Min A verbal and question cues.  SLP Short Term Goal 4 (Week 1): Pt will perform vocal function exercises with Min A verbal cues.  SLP Short Term Goal 5 (Week 1): Pt will utilize swallowing compensatory strategies to minimize overt s/s of aspiration with Mod I.   Skilled Therapeutic Interventions: Treatment focus on cognitive goals. SLP facilitated session by providing Mod question cues for recall of events from previous therapy sessions and for utilization of memory compensatory strategies.  Pt's wife present during session and educated on goals of SLP intervention of increased initiation of verbal expression and of wants/needs.  She verbalized and demonstrated understanding.    FIM:  Comprehension Comprehension Mode: Auditory Comprehension: 4-Understands basic 75 - 89% of the time/requires cueing 10 - 24% of the time Expression Expression Mode: Verbal Expression: 4-Expresses basic 75 - 89% of the time/requires cueing 10 - 24% of the time. Needs helper to occlude trach/needs to repeat words. Social Interaction Social Interaction: 3-Interacts appropriately 50 - 74% of the time - May be physically or verbally inappropriate. Problem Solving Problem Solving: 4-Solves basic 75 - 89% of the time/requires cueing 10 - 24% of the time Memory Memory: 3-Recognizes or recalls 50 -  74% of the time/requires cueing 25 - 49% of the time FIM - Eating Eating Activity: 1: Helper performs IV, parenteral, or tube feeding  Pain Pain Assessment Pain Assessment: No/denies pain  Therapy/Group: Individual Therapy  Mohogany Toppins 05/15/2012, 3:42 PM

## 2012-05-16 ENCOUNTER — Inpatient Hospital Stay (HOSPITAL_COMMUNITY): Payer: BC Managed Care – PPO

## 2012-05-16 ENCOUNTER — Inpatient Hospital Stay (HOSPITAL_COMMUNITY): Payer: BC Managed Care – PPO | Admitting: Occupational Therapy

## 2012-05-16 ENCOUNTER — Inpatient Hospital Stay (HOSPITAL_COMMUNITY): Payer: BC Managed Care – PPO | Admitting: Speech Pathology

## 2012-05-16 ENCOUNTER — Inpatient Hospital Stay (HOSPITAL_COMMUNITY): Payer: BC Managed Care – PPO | Admitting: *Deleted

## 2012-05-16 LAB — GLUCOSE, CAPILLARY: Glucose-Capillary: 119 mg/dL — ABNORMAL HIGH (ref 70–99)

## 2012-05-16 LAB — URINE CULTURE: Colony Count: 9000

## 2012-05-16 MED ORDER — LEVOFLOXACIN 750 MG PO TABS
750.0000 mg | ORAL_TABLET | Freq: Every day | ORAL | Status: DC
  Administered 2012-05-16 – 2012-05-26 (×11): 750 mg via ORAL
  Filled 2012-05-16 (×13): qty 1

## 2012-05-16 NOTE — Progress Notes (Signed)
Speech Language Pathology Daily Session Note  Patient Details  Name: Connor Small MRN: [id number] Date of Birth: 07/10/1960  Today's Date: 05/16/2012 Time: 0830-0900 Time Calculation (min): 30 min  Short Term Goals: Week 1: SLP Short Term Goal 1 (Week 1): Pt will demonstrate sustained attention to a functional task for 15 minutes with Min A verbal and question cues for redirection. SLP Short Term Goal 2 (Week 1): Pt will utilize external memory aids to recall new, daily information with Min A verbal cues.  SLP Short Term Goal 3 (Week 1): Pt will demonstrate functional problem solving for basic and familiar tasks with Min A verbal and question cues.  SLP Short Term Goal 4 (Week 1): Pt will perform vocal function exercises with Min A verbal cues.  SLP Short Term Goal 5 (Week 1): Pt will utilize swallowing compensatory strategies to minimize overt s/s of aspiration with Mod I.   Skilled Therapeutic Interventions: Treatment focus on cognitive goals. SLP facilitated session by providing supervision question cues for functional problem solving with basic money and medication management tasks. Pt required Mod verbal and visual cues to recall information from a paragraph and for utilization of memory compensatory strategies.    FIM:  Comprehension Comprehension Mode: Auditory Comprehension: 5-Understands basic 90% of the time/requires cueing < 10% of the time Expression Expression: 5-Expresses basic 90% of the time/requires cueing < 10% of the time. Social Interaction Social Interaction: 3-Interacts appropriately 50 - 74% of the time - May be physically or verbally inappropriate. Problem Solving Problem Solving: 4-Solves basic 75 - 89% of the time/requires cueing 10 - 24% of the time Memory Memory: 3-Recognizes or recalls 50 - 74% of the time/requires cueing 25 - 49% of the time  Pain Pain Assessment Pain Assessment: No/denies pain  Therapy/Group: Individual Therapy  Connor Small,  Connor Small 05/16/2012, 11:26 AM

## 2012-05-16 NOTE — Progress Notes (Signed)
NUTRITION FOLLOW UP  DOCUMENTATION CODES  Per approved criteria   -Severe malnutrition in the context of acute illness or injury    Intervention:   1. Continue Vital 1.5 at 80 ml/hr x 18 hours. Hold for therapies.  2. Initiate 36-hour calorie count, starting this morning- discussed with wife, RN and daughter. Document percent consumed for each item on the patient's meal tray ticket and keep in envelope. Also document percent of any supplement or snack pt consumes and keep documentation in envelope for RD to review.  3. RD to continue to follow nutrition care plan.  Nutrition Dx:   Inadequate oral intake related to poor appetite, early satiety, and lethargy as evidenced by poor meal completion. Improving.  Goal:   Intake to meet >90% of estimated nutrition needs. Met with enteral nutrition.  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 Pt with acute respiratory and renal failure with ARDS and septic shock. Pt recently discharged to CIR, however currently admitted as inpatient due to possible aspiration PNA (4/2).   Hospital course nutrition summary:  2/2 - 2/20: permissively underfed with EN  2/21-2/26: advanced to full feeds  2/26 - 3/3: feeds held for surgery  3/4 - 3/11: resumed full feeds  3/11 - 3/12: feeds held for GI bleed  3/13 - 3/24: received full feeds  3/24: TFs discontinued diet initiated; pt with very poor oral intake; tx to rehab 3/25  3/28 - 4/1: increased n/v - pt refusing almost all meals and supplements; wife declined calorie count, requesting TPN  4/2: NGT placed to suction, surgery ordering TF initiation; tx back to acute with plans for trickle feedings.  4/3-4/4: Trickle feeds of Vital 1.2 @ 30  mL/hr  4/5: PEG placement, resume of Vital 1.2  4/6-4/9: Inconsistent TF infusion and frequent adjustment of rate. Pt has not yet reached >40 mL/hr with tolerance. Wt now 151 lbs.  4/10-11: Pt has advanced as scheduled to 60 mL/hr. Discussed RN. Plan to increase to 70 mL this afternoon.  4/14: Pt has reached goal TF rate of 75 mL/hr with tolerance. PO intake remains insufficient with improvement per pt's report.  4/15: Pt ready to begin transition to cyclic feeds. RD to order Vital 1.2 @ 85 mL/hr x21 hrs daily. Will continue with daily advancements as tolerated and shorter duration of tubefeeds.  4/16: Pt agreeable to change formula to Vital 1.5 @ 80 mL/hr x 18 hrs daily.  4/17: Transfer to CIR. Continues with order of Vital 1.5 @ 80 ml/hr x 18 hrs daily. 4/21: Initiation of 3-day calorie count; continue Vital 1.5 @ 80 ml/hr x 18 hrs daily.  Current regimen is Vital 1.5 at 80 ml/hr. 200 ml free water TID. This regimen provides: 2160 kcal, 97 grams protein, 1700 ml free water. RN reports pt is tolerating TF well at this time.  Calorie Count Results:  Breakfast: 241 kcal, 5 grams protein Snack: 113 kcal, 2.5 grams protein Lunch: 330 kcal, 6 grams protein Dinner: 140 kcal, 0 grams protein  Total intake: 824 kcal (37% of minimum estimated needs)  13.5 protein (11% of minimum estimated needs) Discussed calorie count results with wife, plan is to continue current TF regimen at this time and continue calorie count x 2 days.  Working towards d/c date of 5/14. Pt febrile last night. Most recent temperature is 99 degrees F. Plan to check CXR today per MD.   Height: Ht Readings from Last 1 Encounters:  05/09/12 5' 10"  (1.778 m)    Weight Status:   Wt Readings from Last 1 Encounters:  05/15/12 150 lb 12.8 oz (68.402 kg)  Wt variable  Re-estimated needs:  Kcal: 2200 - 2400 Protein: 120 - 140 grams Fluid: > 2.2 liters  Skin: incisions, ileostomy, stage II pressure ulcer to R  buttocks  Diet Order: General   Intake/Output Summary (Last 24 hours) at 05/16/12 0807 Last data filed at 05/16/12 0644  Gross per 24 hour  Intake   2740 ml  Output   1725 ml  Net   1015 ml    Last BM: 4/22 (375 ml output yesterday via ostomy)   Labs:   Recent Labs Lab 05/10/12 0842 05/14/12 0803  NA 136 134*  K 5.1 3.6  CL 102 97  CO2 25 25  BUN 21 19  CREATININE 0.69 0.70  CALCIUM 8.5 8.5  GLUCOSE 140* 153*    CBG (last 3)   Recent Labs  05/15/12 1627 05/15/12 2046 05/16/12 0711  GLUCAP 87 75 119*    Scheduled Meds: . aspirin  81 mg Per Tube Daily  . chlorhexidine  15 mL Mouth Rinse BID  . ciprofloxacin  500 mg Oral BID  . clonazePAM  0.5 mg Oral QHS  . darbepoetin (ARANESP) injection - NON-DIALYSIS  40 mcg Subcutaneous Q Tue-1800  . ferrous sulfate  300 mg Per Tube TID WC  . free water  200 mL Per Tube Q3H  . insulin aspart  0-10 Units Subcutaneous TID WC  . insulin aspart  0-5 Units Subcutaneous QHS  . lidocaine  1 patch Transdermal Q24H  . lip balm   Topical BID  . loperamide  2 mg Per Tube QHS  . loperamide  2-4 mg Per Tube UD  . metoCLOPramide  10 mg Oral TID AC  . metoprolol tartrate  6.25 mg Per Tube Daily  . multivitamin  5 mL Per Tube Daily  . pantoprazole sodium  40 mg Per Tube Q1200  . saccharomyces boulardii  250 mg Oral BID    Continuous Infusions: . feeding supplement (VITAL 1.5 CAL) 1,000 mL (05/14/12 0427)    Inda Coke MS, RD, LDN Pager: [phone] After-hours pager: [phone]

## 2012-05-16 NOTE — Progress Notes (Signed)
Occupational Therapy Session Note  Patient Details  Name: Connor Small MRN: [id number] Date of Birth: September 21, 1960  Today's Date: 05/16/2012 Time: 1105-1200 Time Calculation (min): 55 min  Short Term Goals: Week 1:  OT Short Term Goal 1 (Week 1): Self Feeding:  Patient will feed himself at least 50% of 2 meals per day during this recording period. OT Short Term Goal 2 (Week 1): Grooming:  Patient will perform 2 grooming tasks with min assist to include set up once items needed are provided OT Short Term Goal 3 (Week 1): UB Dressing:  Patient will don shirt in supported sit with max assist OT Short Term Goal 4 (Week 1): Sitting balance:  Patient will sit without back support with mod assist while completing simple BADL task at least 2 days this recording period. OT Short Term Goal 5 (Week 1): UE exercises:  Patient will tolerate BUE exercises at least 4 times during this recording period.  Skilled Therapeutic Interventions/Progress Updates:  Patient found supine in bed. Engaged in bed mobility for LB dressing, educated patient on use of reacher for LB dressing and patient return demonstrated use of reacher. Wife and daughter then performed AAROM -Gilman with therapist verbally cueing. BLE AAROM & stretching exercises performed with therapist and wife assisting. Patient then engaged in bed mobility and sat edge of bed to donn shirt with moderate assistance. From edge of bed patient performed max assist slide board transfer into w/c. Educated wife and daughter on positioning in w/c. Encouraged patient to scoot back and position hips in w/c. At end of session left patient seated in w/c with wife and daughter present in room.   Precautions:  Precautions Precautions: Fall;Shoulder Type of Shoulder Precautions: right shoulder subluxation  Precaution Comments:  ostomy, tachycardia, Afib, Wound vac, peg tube (doesn't wear abd binder anymore) Required Braces or Orthoses: Other Brace/Splint Other  Brace/Splint: right wrist splint Restrictions Weight Bearing Restrictions: No  See FIM for current functional status  Therapy/Group: Individual Therapy  Acasia Skilton 05/16/2012, 12:14 PM

## 2012-05-16 NOTE — Progress Notes (Signed)
Aranesp per pharmacy  Hematology / Oncology: aranesp-Tu (started 4/5), oral iron. Hg= 9.6. he received 2 units of PRBC's on 4/14  Iron studies with a low iron TSAT <20%, on ferrous sulfate 387m TID. Hgb 8.4 on 04/22  Goal of Therapy:  Hgb increase of at least 1 gm/dL after 4 weeks of Aranesp   Plan:  -Consider starting IV iron as Aranesp will not increase Hgb if patient is iron depleted  -Aranesp 454m SQ qweek (started 4/5) on Tuesdays -Check CBC and Reticulocyte count at least weekly while on Aranesp

## 2012-05-16 NOTE — Progress Notes (Signed)
Physical Therapy Session Note  Patient Details  Name: Connor Small MRN: [id number] Date of Birth: [birth year]  Today's Date: 05/16/2012 Time: 1400-1500 Time Calculation (min): 60 min  Short Term Goals: Week 1:  PT Short Term Goal 1 (Week 1): Pt will perform bed mobility with total A of 1 person PT Short Term Goal 2 (Week 1): Pt will transfer bed <-> w/c  with max A with pt initiating and actively participating 25% of the time PT Short Term Goal 3 (Week 1): Pt wil be able to demonstrate dynamic sitting balance EOB with mod A for functional task x 5 min  Skilled Therapeutic Interventions/Progress Updates:    Session focused on overall activity tolerance, postural control in sitting/standing (using standing frame), lateral leans propped on elbow, and slide board transfers with pt directing (min cues needed). Wife and daughter present to encourage patient. Improved participation today in session.  Therapy Documentation Precautions:  Precautions Precautions: Fall;Shoulder Type of Shoulder Precautions: right shoulder subluxation  Precaution Comments:  ostomy, tachycardia, Afib, Wound vac, peg tube (doesn't wear abd binder anymore) Required Braces or Orthoses: Other Brace/Splint Other Brace/Splint: right wrist splint Restrictions Weight Bearing Restrictions: No   Pain:  Pain in R knee when in standing frame - repositioned and added pillow for support for relief.   See FIM for current functional status  Therapy/Group: Individual Therapy and Co-Treatment with Recreational Therapy  [name] [facility] 05/16/2012, 4:12 PM

## 2012-05-16 NOTE — Evaluation (Signed)
Recreational Therapy Assessment and Plan  Patient Details  Name: Connor Small MRN: [id number] Date of Birth: 26-May-1960 Today's Date: 05/16/2012  Rehab Potential: Good ELOS: 4 weeks   Assessment Clinical Impression: Problem List:  Patient Active Problem List   Diagnosis   .  history of UC (ulcerative colitis)   .  Sinus tachycardia   .  Hematuria   .  Pleural effusion, L > R   .  Leukocytosis, unspecified   .  Hyperbilirubinemia, due to cholestasis   .  PAF (paroxysmal atrial fibrillation)   .  Anemia of chronic disease   .  Quadriparesis   .  S/p total colectomy for toxic megacolon, C diff   .  Hypernatremia, improving   .  Severe muscle deconditioning   .  Shoulder subluxation, right   .  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  Coagulopathy   .  Critical illness myopathy and neuropathy   .  Acute blood loss anemia   .  HAP (hospital-acquired pneumonia)   .  Ileostomy in place   .  Protein-calorie malnutrition, severe   .  Spleen hematoma - subcapsular & without rupture   .  Ascites s/p US guide paracentesis (4/2)   .  Aspiration pneumonitis   .  Clostridium difficile colitis   .  Intra-abdominal fluid collection/LUQ   .  CKD (chronic kidney disease) stage 2, GFR 60-89 ml/min   .  Acute renal failure   .  Hypokalemia    Past Medical History:  Past Medical History   Diagnosis  Date   .  Chronic diarrhea    .  Rectal bleed    .  Hemorrhoids    .  Ulcerative colitis      Distal UC over 8 yrs ago diagnosed   .  Diverticulitis of large intestine with perforation  10/2011     done at [facility]   .  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  history of UC (ulcerative colitis)  12/06/2010    Past Surgical History:  Past Surgical History   Procedure  Laterality  Date   .  Colonoscopy   9/03   .  Sigmoidoscopy       06/13/2002   .  Temporary ostomy November 06, 2010       for a colon perforation that was done in [facility] (Dr Ruben Im).   .  Colonoscopy    02/16/2011     Procedure: COLONOSCOPY; Surgeon: Malissa Hippo, MD; Location: [facility]; Service: Endoscopy; Laterality: N/A; 100   .  Flexible sigmoidoscopy   02/27/2012     Procedure: FLEXIBLE SIGMOIDOSCOPY; Surgeon: Malissa Hippo, MD; Location: [facility]; Service: Endoscopy; Laterality: N/A; with colonic decompression   .  Laparotomy   02/27/2012     Procedure: EXPLORATORY LAPAROTOMY; Surgeon: Fabio Bering, MD; Location: [facility] ORS; Service: General; Laterality: N/A;   .  Cecostomy   02/27/2012     Procedure: CECOSTOMY; Surgeon: Fabio Bering, MD; Location: [facility] OR[facility]; Service: General; Laterality: N/A; Cecostomy Tube Placement   .  Colectomy   02/28/2012     Procedure: TOTAL COLECTOMY; Surgeon: Fabio Bering, MD; Location: [facility] OR[facility]; Service: General; Laterality: N/A;   .  Ileostomy   02/28/2012     Procedure: ILEOSTOMY; Surgeon: Fabio Bering, MD; Location: [facility]; Service: General; Laterality: N/A;   .  Cystoscopy w/ ureteral stent placement  Bilateral  03/20/2012  Procedure: CYSTOSCOPY WITH RETROGRADE PYELOGRAM/URETERAL STENT PLACEMENT; Surgeon: Sebastian Ache, MD; Location: [facility] OR; Service: Urology; Laterality: Bilateral;   .  Laparotomy  N/A  03/26/2012     Procedure: EXPLORATORY LAPAROTOMY; Surgeon: Mariella Saa, MD; Location: [facility] OR; Service: General; Laterality: N/A;   .  Application of wound vac  N/A  03/26/2012     Procedure: APPLICATION OF WOUND VAC; Surgeon: Mariella Saa, MD; Location: [facility] OR; Service: General; Laterality: N/A;   .  Liver biopsy  N/A  03/26/2012     Procedure: LIVER BIOPSY; Surgeon: Mariella Saa, MD; Location: [facility] OR; Service: General; Laterality: N/A;   .  Laparotomy  N/A  03/29/2012     Procedure: EXPLORATORY LAPAROTOMY, PARTIAL WOUND CLOSURE; Surgeon: Mariella Saa, MD; Location: [facility] OR; Service: General; Laterality: N/A;   .  Vacuum assisted closure change  N/A  03/29/2012     Procedure: Open ABDOMINAL VACUUM CHANGE; Surgeon: Mariella Saa, MD; Location: [facility] OR; Service: General; Laterality: N/A;   .  Vacuum assisted closure change  N/A  04/01/2012     Procedure: removal of abdominal vac dressing and abdominal closure; Surgeon: Mariella Saa, MD; Location: [facility] OR; Service: General; Laterality: N/A;    Assessment & Plan  Clinical Impression: Patient is a 52 y.o. year old male originally admitted via [facility] with c-diff colitis with toxic megacolon requiring total colectomy. 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 He was admitted to CIR on 04/14/12 for rehab. He was noted to have poor po intake with continued GI issues. He has aspiration event due to N/V with aspiration pneumonitis with leucocytosis. He was treated with 5 day course of antibiotics and due to concerns of HCAP was transfered to acute on 04/25/12. Wound was I and D and treated with VAC to promote healing. PEG was placed by IVR on 04/05 to help with nutritional status and endurance. On arnasep for anemia of chronic illness. Mentation and endurance improved. Patient transferred to CIR on 05/09/2012.  Pt presents with decreased activity tolerance, decreased functional mobility, decreased balance,  muscle paralysis, decreased initiation, decreased problem solving and decreased memory Limiting pt's independence with leisure/community pursuits.   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) Other Leisure Interests: Television Leisure Participation Style: With  Family/Friends;Alone Awareness of Community Resources: Good-identify 3 post discharge leisure resources Psychosocial / Spiritual Patient agreeable to Pet Therapy: Yes Does patient have pets?: No Social interaction - Mood/Behavior: Cooperative Film/video editor for Education?: Yes Recreational Therapy Orientation Orientation -Reviewed with patient: Available activity resources Strengths/Weaknesses Patient Strengths/Abilities: Willingness to participate Patient weaknesses: Physical limitations  Plan Rec Therapy Plan Is patient appropriate for Therapeutic Recreation?: Yes Rehab Potential: Good Treatment times per week: Min 1 time per week >20 minutes Estimated Length of Stay: 4 weeks TR Treatment/Interventions: Adaptive equipment instruction;1:1 session;Balance/vestibular training;Functional mobility training;Community reintegration;Patient/family education;Therapeutic activities;Recreation/leisure participation;Therapeutic exercise;UE/LE Coordination activities;Wheelchair propulsion/positioning Recommendations for other services: Neuropsych  Recommendations for other services: Neuropsych  Discharge Criteria: Patient will be discharged from TR if patient refuses treatment 3 consecutive times without medical reason.  If treatment goals not met, if there is a change in  medical status, if patient makes no progress towards goals or if patient is discharged from hospital.  The above assessment, treatment plan, treatment alternatives and goals were discussed and mutually agreed upon: by patient  Tyerra Loretto 05/16/2012, 4:44 PM

## 2012-05-16 NOTE — Progress Notes (Signed)
Subjective/Complaints: Still with low grade temp. Complains of continued dysuria. Mild cough as well A 12 point review of systems has been performed and if not noted above is otherwise negative.   Objective: Vital Signs: Blood pressure 100/62, pulse 127, temperature 99 F (37.2 C), temperature source Oral, resp. rate 18, height 5' 10"  (1.778 m), weight 68.402 kg (150 lb 12.8 oz), SpO2 94.00%. No results found.  Recent Labs  05/14/12 0803 05/15/12 0645  WBC 12.7* 12.2*  HGB 8.4* 8.4*  HCT 25.7* 24.8*  PLT 397 392    Recent Labs  05/14/12 0803  NA 134*  K 3.6  CL 97  GLUCOSE 153*  BUN 19  CREATININE 0.70  CALCIUM 8.5   CBG (last 3)   Recent Labs  05/15/12 1627 05/15/12 2046 05/16/12 0711  GLUCAP 87 75 119*    Wt Readings from Last 3 Encounters:  05/15/12 68.402 kg (150 lb 12.8 oz)  05/07/12 66.2 kg (145 lb 15.1 oz)  04/18/12 71.94 kg (158 lb 9.6 oz)    Physical Exam:  Constitutional: He appears well-developed. He has a sickly appearance.   HENT:  Head: Normocephalic and atraumatic.  Eyes: Pupils are equal, round, and reactive to light.  Neck: Normal range of motion.  Cardiovascular: Regular rhythm. Tachycardia present.  HR 100's during the exam.  Pulmonary/Chest: Effort normal. No respiratory distress.  He has no wheezes. Does have some upper airway sounds Abdominal: He exhibits minimal distension. Bowel sounds are increased. There is tenderness.  Midline incision with retention sutures in place--dehisced incision with pink, clean wound bed.  Marland Kitchen ---closing nicely--central area still with some depth Neurological: He is alert.   Basic insight and awareness. Was able to follow basic commands. Hoarse voice. 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 UE grossly  2to 3/5 LUE, 1-2/5 RUE with contracture in Hand (limited flexion). LE 1-2/5with weakness moreso in ankle dorsiflexion. DTR's 1+. Diffuse muscle wasting.  Skin: Skin is warm and dry.  Psychiatric:  relaxed  Assessment/Plan:  1. 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 Physiatrist is providing close team supervision and 24 hour management of active medical problems listed below. Physiatrist and rehab team continue to assess barriers to discharge/monitor patient progress toward functional and medical goals.  I reviewed with the patient his need to work through some of the mental and physical barriers he has. He and wife voiced a basic understanding. Therapy team/SW will review as well.  FIM: FIM - Bathing Bathing Steps Patient Completed: Chest;Right Arm;Abdomen;Front perineal area;Right upper leg;Left upper leg Bathing: 3: Mod-Patient completes 5-7 30f10 parts or 50-74% (supine for LB in w/c for upper body)  FIM - Upper Body Dressing/Undressing Upper body dressing/undressing steps patient completed: Thread/unthread right sleeve of pullover shirt/dresss;Thread/unthread left sleeve of pullover shirt/dress;Put head through opening of pull over shirt/dress (in w/c) Upper body dressing/undressing: 4: Min-Patient completed 75 plus % of tasks (max encouragement) FIM - Lower Body Dressing/Undressing Lower body dressing/undressing: 1: Total-Patient completed less than 25% of tasks (assisted with all steps with max encouragement)  FIM - Toileting Toileting: 0: Activity did not occur  FIM - TAir cabin crewTransfers: 0-Activity did not occur  FIM - BControl and instrumentation engineerDevices: Sliding board Bed/Chair Transfer: 2: Supine > Sit: Max A (lifting assist/Pt. 25-49%);3: Sit > Supine: Mod A (lifting assist/Pt. 50-74%/lift 2 legs);1: Chair or W/C > Bed: Total  A (helper does all/Pt. < 25%);1: Bed > Chair or W/C: Total A (helper does all/Pt. < 25%)  FIM - Locomotion: Wheelchair Locomotion:  Wheelchair: 1: Travels less than 50 ft with moderate assistance (Pt: 50 - 74%) FIM - Locomotion: Ambulation Ambulation/Gait Assistance: Not tested (comment) Locomotion: Ambulation: 0: Activity did not occur  Comprehension Comprehension Mode: Auditory Comprehension: 4-Understands basic 75 - 89% of the time/requires cueing 10 - 24% of the time  Expression Expression Mode: Verbal Expression: 4-Expresses basic 75 - 89% of the time/requires cueing 10 - 24% of the time. Needs helper to occlude trach/needs to repeat words.  Social Interaction Social Interaction Mode: Not assessed Social Interaction: 3-Interacts appropriately 50 - 74% of the time - May be physically or verbally inappropriate.  Problem Solving Problem Solving Mode: Not assessed Problem Solving: 3-Solves basic 50 - 74% of the time/requires cueing 25 - 49% of the time  Memory Memory Mode: Not assessed Memory: 3-Recognizes or recalls 50 - 74% of the time/requires cueing 25 - 49% of the time  Medical Problem List and Plan:  1. DVT Prophylaxis/Anticoagulation: Mechanical: Sequential compression devices, below knee Bilateral lower extremities thrombocytopenia resolved.   2. Multifactorial pain: tylenol, local measures, use ultram for more severe pain 3. Mood: neuropsych consult requested. Needs encouragement  -klonopin to continue 4. Neuropsych: This patient is not capable of making decisions on his/her own behalf.  5. FEN::   Serial labs, adjusting fluids, formula as well. Potassium slightly elevated--recheck Monday  -change to boluses at some point depending upon PO intake 6. Bilateral cardioembolic CVA: no blood thinners due to issues with bleeding?  7. ABLA:  Aranesp, feso4--  -hgb remains at 8.4  -recheck cbc later this week  -no signs of blood loss 8. 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 Protect heels, stretch heel cords  -AFO's at some point? 9. Low grade temp- urine culture  negative  -continue cipro  -check cxr today  LOS (Days) 7 A FACE TO FACE EVALUATION WAS PERFORMED  Connor Small T 05/16/2012 7:38 AM

## 2012-05-17 ENCOUNTER — Inpatient Hospital Stay (HOSPITAL_COMMUNITY): Payer: BC Managed Care – PPO | Admitting: *Deleted

## 2012-05-17 ENCOUNTER — Inpatient Hospital Stay (HOSPITAL_COMMUNITY): Payer: BC Managed Care – PPO | Admitting: Occupational Therapy

## 2012-05-17 ENCOUNTER — Inpatient Hospital Stay (HOSPITAL_COMMUNITY): Payer: BC Managed Care – PPO | Admitting: Speech Pathology

## 2012-05-17 LAB — GLUCOSE, CAPILLARY
Glucose-Capillary: 135 mg/dL — ABNORMAL HIGH (ref 70–99)
Glucose-Capillary: 112 mg/dL — ABNORMAL HIGH (ref 70–99)

## 2012-05-17 NOTE — Progress Notes (Signed)
Physical Therapy Weekly Progress Note  Patient Details  Name: Connor Small MRN: [id number] Date of Birth: 1960-09-08  Today's Date: 05/17/2012 Time: 7062-3762 Time Calculation (min): 56 min Session focused on activity tolerance, strengthening seated in w/c using Kinetron (started resistance at 70 and progressed to 65) 3 sets of 10 reps each (able to push pedals independently after initial trials), slide board transfers with max A on level surface with emphasis on pt using LUE to push/pull on armrest to aid with transfers, dynamic sitting balance activity and postural education/neuro re-ed with trunk control and RUE to reach outside BOS for ring toss (hand over hand assist on RUE but able to activate through shoulder), and scooting edge of mat (still difficulty with body positioning/awareness to complete this successfully) to aid with transfers. Daughter and wife present to observe and encourage pt. Improvement with pt participation and awareness today noted during session.    Patient has met 4 of 4 short term goals.  Pt has made physical and cognitive improvements since evaluation and level of active participation has improved as well. Pt is currently max A to total A for slide board transfers, but is now able to correctly direct transfer set-up. In therapy, we are utilizing standing frame for postural re-education and standing tolerance (pain and tolerance limiting). Pt continues to require encouragement to try new things (quick to reply with "I can't"), but this is improving. Will continue to work towards progress with transfers, balance, strengthening, standing, and w/c propulsion this week. Family is very involved but is stepping back during therapy sessions to allow pt to engage as much as possible.    Patient continues to demonstrate the following deficits: impaired balance, decreased strength, decreased activity tolerance, decreased initiation, decreased motor planning, decreased postural  control, decreased functional mobility, and therefore will continue to benefit from skilled PT intervention to enhance overall performance with activity tolerance, balance, postural control, ability to compensate for deficits, functional use of  right upper extremity and right lower extremity and coordination.  Patient progressing toward long term goals..  Continue plan of care.  PT Short Term Goals Week 1:  PT Short Term Goal 1 (Week 1): Pt will perform bed mobility with total A of 1 person PT Short Term Goal 1 - Progress (Week 1): Met PT Short Term Goal 2 (Week 1): Pt will transfer bed <-> w/c  with max A with pt initiating and actively participating 25% of the time PT Short Term Goal 2 - Progress (Week 1): Met PT Short Term Goal 3 (Week 1): Pt wil be able to demonstrate dynamic sitting balance EOB with mod A for functional task x 5 min Week 2:  PT Short Term Goal 1 (Week 2): Pt will be able to perform bed mobility with mod A for supine to sit PT Short Term Goal 2 (Week 2): Pt will be able to scoot on edge of mat (using LE's to push through) with mod A to aid with slide board transfers PT Short Term Goal 3 (Week 2): Pt will be able to propel w/c with hemi technique x 50' with mod A  Skilled Therapeutic Interventions/Progress Updates:  Ambulation/gait training;Balance/vestibular training;Cognitive remediation/compensation;Discharge planning;Disease management/prevention;DME/adaptive equipment instruction;Functional electrical stimulation;Functional mobility training;Neuromuscular re-education;Pain management;Patient/family education;Psychosocial support;Skin care/wound management;Splinting/orthotics;Stair training;Therapeutic Activities;Therapeutic Exercise;UE/LE Strength taining/ROM;UE/LE Coordination activities;Wheelchair propulsion/positioning   Therapy Documentation Precautions:  Precautions Precautions: Fall;Shoulder Type of Shoulder Precautions: right shoulder subluxation  Precaution  Comments:  ostomy, tachycardia, Afib, Wound vac, peg tube (doesn't wear abd binder anymore)  Required Braces or Orthoses: Other Brace/Splint Other Brace/Splint: right wrist splint Restrictions Weight Bearing Restrictions: No  Pain: Medication given prior to therapy - no complaints of pain.  See FIM for current functional status  Therapy/Group: Individual Therapy  Karolee Stamps [facility] 05/17/2012, 4:25 PM

## 2012-05-17 NOTE — Progress Notes (Addendum)
NUTRITION FOLLOW UP  DOCUMENTATION CODES  Per approved criteria   -Severe malnutrition in the context of acute illness or injury    Intervention:   1. Continue Vital 1.5 at 80 ml/hr x 18 hours. Hold for therapies.  2. Continue 36-hour calorie count, starting this morning- discussed with wife, RN and daughter. Document percent consumed for each item on the patient's meal tray ticket and keep in envelope. Also document percent of any supplement or snack pt consumes and keep documentation in envelope for RD to review.  3. RD to continue to follow nutrition care plan.  Nutrition Dx:   Inadequate oral intake related to poor appetite, early satiety, and lethargy as evidenced by poor meal completion. Improving.  Goal:   Intake to meet >90% of estimated nutrition needs. Met with enteral nutrition.  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 Pt with acute respiratory and renal failure with ARDS and septic shock. Pt recently discharged to CIR, however currently admitted as inpatient due to possible aspiration PNA (4/2).   Hospital course nutrition summary:  2/2 - 2/20: permissively underfed with EN  2/21-2/26: advanced to full feeds  2/26 - 3/3: feeds held for surgery  3/4 - 3/11: resumed full feeds  3/11 - 3/12: feeds held for GI bleed  3/13 - 3/24: received full feeds  3/24: TFs discontinued diet initiated; pt with very poor oral intake; tx to rehab 3/25  3/28 - 4/1: increased n/v - pt refusing almost all meals and supplements; wife declined calorie count, requesting TPN  4/2: NGT placed to suction, surgery ordering TF initiation; tx back to acute with plans for trickle feedings.  4/3-4/4: Trickle feeds of Vital 1.2 @ 30  mL/hr  4/5: PEG placement, resume of Vital 1.2  4/6-4/9: Inconsistent TF infusion and frequent adjustment of rate. Pt has not yet reached >40 mL/hr with tolerance. Wt now 151 lbs.  4/10-11: Pt has advanced as scheduled to 60 mL/hr. Discussed RN. Plan to increase to 70 mL this afternoon.  4/14: Pt has reached goal TF rate of 75 mL/hr with tolerance. PO intake remains insufficient with improvement per pt's report.  4/15: Pt ready to begin transition to cyclic feeds. RD to order Vital 1.2 @ 85 mL/hr x21 hrs daily. Will continue with daily advancements as tolerated and shorter duration of tubefeeds.  4/16: Pt agreeable to change formula to Vital 1.5 @ 80 mL/hr x 18 hrs daily.  4/17: Transfer to CIR. Continues with order of Vital 1.5 @ 80 ml/hr x 18 hrs daily. 4/21: Initiation of 3-day calorie count; continue Vital 1.5 @ 80 ml/hr x 18 hrs daily. 4/22: Day 1 Calorie Count Results -824 kcal (37% of minimum estimated needs) and 13.5 protein (11% of minimum estimated needs)  Current regimen is Vital 1.5 at 80 ml/hr. 200 ml free water TID. This regimen provides: 2160 kcal, 97 grams protein, 1700 ml free water. RN reports pt is tolerating TF well at this time.  Calorie Count Results:  Breakfast: 245 kcal, 6 grams protein Lunch: 360 kcal, 10 grams protein Dinner: 400 kcal, 35 grams protein  Total intake: 1005 kcal (46% of minimum estimated needs)  51 protein (43% of minimum estimated needs) Discussed calorie count results with wife, plan is to  continue current TF regimen at this time and continue calorie count x 1 more day.   Working towards d/c date of 5/14. Pt still with low grade fever earlier this morning. HA last night and this AM. Most recent temperature is 98.6 degrees F. CXR yesterday revealed some increase in R pleural effusion, large L pleural  effusion unchanged. Bilateral airspace disease persists.    Height: Ht Readings from Last 1 Encounters:  05/09/12 5' 10"  (1.778 m)    Weight  Status:   Wt Readings from Last 1 Encounters:  05/17/12 143 lb 8 oz (65.091 kg)  Wt variable  Re-estimated needs:  Kcal: 2200 - 2400 Protein: 120 - 140 grams Fluid: > 2.2 liters  Skin: incisions, ileostomy, stage II pressure ulcer to R buttocks  Diet Order: General   Intake/Output Summary (Last 24 hours) at 05/17/12 0906 Last data filed at 05/17/12 0458  Gross per 24 hour  Intake   1540 ml  Output   2450 ml  Net   -910 ml    Last BM: 4/24 (425 ml output today via ostomy)   Labs:   Recent Labs Lab 05/14/12 0803  NA 134*  K 3.6  CL 97  CO2 25  BUN 19  CREATININE 0.70  CALCIUM 8.5  GLUCOSE 153*    CBG (last 3)   Recent Labs  05/16/12 1633 05/16/12 2033 05/17/12 0708  GLUCAP 109* 115* 135*    Scheduled Meds: . aspirin  81 mg Per Tube Daily  . chlorhexidine  15 mL Mouth Rinse BID  . clonazePAM  0.5 mg Oral QHS  . darbepoetin (ARANESP) injection - NON-DIALYSIS  40 mcg Subcutaneous Q Tue-1800  . ferrous sulfate  300 mg Per Tube TID WC  . free water  200 mL Per Tube Q3H  . insulin aspart  0-10 Units Subcutaneous TID WC  . insulin aspart  0-5 Units Subcutaneous QHS  . levofloxacin  750 mg Oral q1800  . lidocaine  1 patch Transdermal Q24H  . lip balm   Topical BID  . loperamide  2 mg Per Tube QHS  . loperamide  2-4 mg Per Tube UD  . metoCLOPramide  10 mg Oral TID AC  . metoprolol tartrate  6.25 mg Per Tube Daily  . multivitamin  5 mL Per Tube Daily  . pantoprazole sodium  40 mg Per Tube Q1200  . saccharomyces boulardii  250 mg Oral BID    Continuous Infusions: . feeding supplement (VITAL 1.5 CAL) 1,000 mL (05/16/12 1827)    Inda Coke MS, RD, LDN Pager: [phone] After-hours pager: [phone]

## 2012-05-17 NOTE — Progress Notes (Signed)
Subjective/Complaints: Still with low grade temp. Frontal Headache this am and last night A 12 point review of systems has been performed and if not noted above is otherwise negative.   Objective: Vital Signs: Blood pressure 125/68, pulse 86, temperature 98.6 F (37 C), temperature source Oral, resp. rate 18, height 5' 10"  (1.778 m), weight 65.091 kg (143 lb 8 oz), SpO2 98.00%. Dg Chest 2 View  05/16/2012  *RADIOLOGY REPORT*  Clinical Data: Cough and fever.  CHEST - 2 VIEW  Comparison: Single view of the chest 04/24/2012 and CT abdomen and pelvis 04/24/2012.  Findings: Large left and small right pleural effusion are again seen.  Right pleural effusion appears increased.  There is associated left worse than right airspace disease.  Heart size is normal.  No pneumothorax.  IMPRESSION: Some increase in a right pleural effusion.  Large left pleural effusion is unchanged.  Bilateral airspace disease persists.   Original Report Authenticated By: Orlean Patten, M.D.     Recent Labs  05/14/12 0803 05/15/12 0645  WBC 12.7* 12.2*  HGB 8.4* 8.4*  HCT 25.7* 24.8*  PLT 397 392    Recent Labs  05/14/12 0803  NA 134*  K 3.6  CL 97  GLUCOSE 153*  BUN 19  CREATININE 0.70  CALCIUM 8.5   CBG (last 3)   Recent Labs  05/16/12 1633 05/16/12 2033 05/17/12 0708  GLUCAP 109* 115* 135*    Wt Readings from Last 3 Encounters:  05/17/12 65.091 kg (143 lb 8 oz)  05/07/12 66.2 kg (145 lb 15.1 oz)  04/18/12 71.94 kg (158 lb 9.6 oz)    Physical Exam:  Constitutional: He appears well-developed. He has a sickly appearance.   HENT:  Head: Normocephalic and atraumatic.  Eyes: Pupils are equal, round, and reactive to light.  Neck: Normal range of motion.  Cardiovascular: Regular rhythm. Tachycardia present.  HR 100's during the exam.  Pulmonary/Chest: Effort normal. No respiratory distress.  He has no wheezes. Decreased sounds at the bases. Abdominal: He exhibits minimal distension. Bowel  sounds are increased. There is tenderness.  Midline incision with retention sutures in place--dehisced incision with pink, clean wound bed.  . ---continuing to close nicely--central area still with some depth Neurological: He is alert.   Basic insight and awareness. Was able to follow basic commands. Hoarse voice. 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 UE grossly  2to 3/5 LUE, 1-2/5 RUE with contracture in Hand (limited flexion). LE 1-2/5with weakness moreso in ankle dorsiflexion. DTR's 1+. Diffuse muscle wasting.  Skin: Skin is warm and dry.  Psychiatric: relaxed  Assessment/Plan:  1. 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 Physiatrist is providing close team supervision and 24 hour management of active medical problems listed below. Physiatrist and rehab team continue to assess barriers to discharge/monitor patient progress toward functional and medical goals.  FIM: FIM - Bathing Bathing Steps Patient Completed: Chest;Right Arm;Abdomen;Front perineal area;Right upper leg;Left upper leg Bathing: 3: Mod-Patient completes 5-7 64f10 parts or 50-74% (supine for LB in w/c for upper body)  FIM - Upper Body Dressing/Undressing Upper body dressing/undressing steps patient completed: Thread/unthread right sleeve of pullover shirt/dresss;Thread/unthread left sleeve of pullover shirt/dress Upper body dressing/undressing: 3: Mod-Patient completed 50-74% of tasks FIM - Lower Body Dressing/Undressing Lower body dressing/undressing: 1: Total-Patient completed less than 25% of tasks  FIM - Toileting Toileting: 0: Activity did not occur  FIM -  Air cabin crew Transfers: 0-Activity did not occur  FIM - Control and instrumentation engineer Devices: Arm rests;Sliding board Bed/Chair Transfer: 2: Bed >  Chair or W/C: Max A (lift and lower assist);2: Chair or W/C > Bed: Max A (lift and lower assist)  FIM - Locomotion: Wheelchair Locomotion: Wheelchair: 1: Travels less than 50 ft with moderate assistance (Pt: 50 - 74%) FIM - Locomotion: Ambulation Ambulation/Gait Assistance: Not tested (comment) Locomotion: Ambulation: 0: Activity did not occur  Comprehension Comprehension Mode: Auditory Comprehension: 5-Understands basic 90% of the time/requires cueing < 10% of the time  Expression Expression Mode: Verbal Expression: 5-Expresses complex 90% of the time/cues < 10% of the time  Social Interaction Social Interaction Mode: Not assessed Social Interaction: 3-Interacts appropriately 50 - 74% of the time - May be physically or verbally inappropriate.  Problem Solving Problem Solving Mode: Not assessed Problem Solving: 4-Solves basic 75 - 89% of the time/requires cueing 10 - 24% of the time  Memory Memory Mode: Not assessed Memory: 3-Recognizes or recalls 50 - 74% of the time/requires cueing 25 - 49% of the time  Medical Problem List and Plan:  1. DVT Prophylaxis/Anticoagulation: Mechanical: Sequential compression devices, below knee Bilateral lower extremities thrombocytopenia resolved.   2. Multifactorial pain: tylenol, local measures, use ultram for more severe pain, HA 3. Mood: neuropsych consult requested. Needs encouragement  -klonopin to continue 4. Neuropsych: This patient is not capable of making decisions on his/her own behalf.  5. FEN::   Serial labs, adjusting fluids, formula as well. Potassium slightly elevated--recheck Monday  -change to boluses at some point depending upon PO intake 6. Bilateral cardioembolic CVA: no blood thinners due to issues with bleeding 7. ABLA:  Aranesp, feso4--  -recheck cbc tomorrow  -no signs of blood loss 8. 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 Protect heels, stretch heel cords  -AFO's at some  point? 9. Low grade temp- urine culture negative, cxr with increased effusion, ?infiltrate  -continue levaquin  -recheck cxr tomorrow  -IS  -consider dopplers, although had negative dopplers a month ago  LOS (Days) 8 A FACE TO FACE EVALUATION WAS PERFORMED  Connor Small T 05/17/2012 7:58 AM

## 2012-05-17 NOTE — Progress Notes (Signed)
Speech Language Pathology  Weekly Progress & Session Notes  Patient Details  Name: Connor Small MRN: [id number] Date of Birth: November 14, 1960  Today's Date: 05/17/2012 Time: 0805-0830 Time Calculation (min): 25 min  Short Term Goals: Week 1: SLP Short Term Goal 1 (Week 1): Pt will demonstrate sustained attention to a functional task for 15 minutes with Min A verbal and question cues for redirection. SLP Short Term Goal 1 - Progress (Week 1): Met SLP Short Term Goal 2 (Week 1): Pt will utilize external memory aids to recall new, daily information with Min A verbal cues.  SLP Short Term Goal 2 - Progress (Week 1): Met SLP Short Term Goal 3 (Week 1): Pt will demonstrate functional problem solving for basic and familiar tasks with Min A verbal and question cues.  SLP Short Term Goal 3 - Progress (Week 1): Met SLP Short Term Goal 4 (Week 1): Pt will perform vocal function exercises with Min A verbal cues.  SLP Short Term Goal 4 - Progress (Week 1): Met SLP Short Term Goal 5 (Week 1): Pt will utilize swallowing compensatory strategies to minimize overt s/s of aspiration with Mod I.  SLP Short Term Goal 5 - Progress (Week 1): Met  New Short Term Goals:  Week 2: SLP Short Term Goal 1 (Week 2): Pt will perform vocal function exercises with Mod I.  SLP Short Term Goal 2 (Week 2): Pt will demonstrate functional problem solving with basic and familiar tasks with supervision verbal cues.  SLP Short Term Goal 3 (Week 2): Pt will utilize external memory aids to recall new, daily information with supervision verbal cues.  SLP Short Term Goal 4 (Week 2): pt will demonstrate selective attention to a functional task for 30 minutes with min verbal cues.  SLP Short Term Goal 5 (Week 2): Pt will initiate functional tasks with supervision verbal and question cues.   Weekly Progress Updates: Pt has made functional gains and has met 5 of 5 STG's this reporting period. Currently, pt is consuming regular textures with  thin liquids and is Mod I for utilization of swallowing compensatory strategies. Pt is currently Min A for functional problem solving, carryover of new information with utilization of memory compensatory strategies, sustained attention and initiation of tasks. Pt is also performing vocal function exercises with use of diaphragmatic breathing with min verbal cues for increased vocal intensity at the sentence level. Pt/family education ongoing in regards to pt directing his care with increased decision making. Pt would benefit continued skilled SLP intervention to maximize cognitive function and overall functional independence.    SLP Intensity: Minumum of 1-2 x/day, 30 to 90 minutes SLP Frequency: 5 out of 7 days SLP Duration/Estimated Length of Stay: 3 weeks SLP Treatment/Interventions: Cognitive remediation/compensation;Cueing hierarchy;Dysphagia/aspiration precaution training;Functional tasks;Environmental controls;Internal/external aids;Patient/family education;Speech/Language facilitation;Therapeutic Activities  Daily Session Skilled Therapeutic Intervention: Treatment focus on speech goals. SLP facilitated session by providing Min A verbal and demonstration cues for vocal function exercises and diaphragmatic breathing in the supine position. Pt continued to demonstrate increased vocal intensity at the sentence level. Pt also with increased initiation of verbal expression and social interaction.  FIM:  Comprehension Comprehension Mode: Auditory Comprehension: 6-Follows complex conversation/direction: With extra time/assistive device Expression Expression Mode: Verbal Expression: 5-Expresses complex 90% of the time/cues < 10% of the time Social Interaction Social Interaction: 5-Interacts appropriately 90% of the time - Needs monitoring or encouragement for participation or interaction. Problem Solving Problem Solving: 5-Solves basic 90% of the time/requires cueing < 10% of  the  time Memory Memory: 5-Recognizes or recalls 90% of the time/requires cueing < 10% of the time FIM - Eating Eating Activity: 1: Helper performs IV, parenteral, or tube feeding Pain Pain Assessment Pain Assessment: 0-10 Pain Score:   4 Pain Type: Acute pain Pain Location: Head Pain Descriptors: Headache Pain Onset: On-going Patients Stated Pain Goal: 2 Pain Intervention(s): Medication (See eMAR)  Therapy/Group: Individual Therapy  Dorella Laster 05/17/2012, 4:07 PM

## 2012-05-17 NOTE — Progress Notes (Signed)
Occupational Therapy Session Note  Patient Details  Name: Connor Small MRN: [id number] Date of Birth: 02-Apr-1960  Today's Date: 05/17/2012 Time: 1100-1155 Time Calculation (min): 55 min  Short Term Goals: Week 1:  OT Short Term Goal 1 (Week 1): Self Feeding:  Patient will feed himself at least 50% of 2 meals per day during this recording period. OT Short Term Goal 2 (Week 1): Grooming:  Patient will perform 2 grooming tasks with min assist to include set up once items needed are provided OT Short Term Goal 3 (Week 1): UB Dressing:  Patient will don shirt in supported sit with max assist OT Short Term Goal 4 (Week 1): Sitting balance:  Patient will sit without back support with mod assist while completing simple BADL task at least 2 days this recording period. OT Short Term Goal 5 (Week 1): UE exercises:  Patient will tolerate BUE exercises at least 4 times during this recording period.  Skilled Therapeutic Interventions/Progress Updates:  No complaints of pain Patient found supine in bed. Therapist educated wife on UE exercises (also gave handout for visual reminder/cue). Therapist and wife then performed AAROM exercises -> patient's BUEs (shoulders, elbows, forearms, wrists, and fingers). Wife stated she performed LE AAROM/stretching exercises with patient prior to session. Patient then engaged in bed mobility and sat edge of bed for UB/LB dressing using reacher to help increase independence. Patient able to maintain dynamic sitting balance with supervision. Therapist donned bilateral shoes. Then patient transferred edge of bed -> w/c using slide board with max assist. Therapist assisted with positioning patient in w/c, then left patient seated in w/c with wife present.   Precautions:  Precautions Precautions: Fall;Shoulder Type of Shoulder Precautions: right shoulder subluxation  Precaution Comments:  ostomy, tachycardia, Afib, Wound vac, peg tube (doesn't wear abd binder anymore) Required  Braces or Orthoses: Other Brace/Splint Other Brace/Splint: right wrist splint Restrictions Weight Bearing Restrictions: No  See FIM for current functional status  Therapy/Group: Individual Therapy  Brielle Moro 05/17/2012, 12:02 PM

## 2012-05-18 ENCOUNTER — Inpatient Hospital Stay (HOSPITAL_COMMUNITY): Payer: BC Managed Care – PPO | Admitting: Occupational Therapy

## 2012-05-18 ENCOUNTER — Inpatient Hospital Stay (HOSPITAL_COMMUNITY): Payer: BC Managed Care – PPO | Admitting: Speech Pathology

## 2012-05-18 DIAGNOSIS — M7989 Other specified soft tissue disorders: Secondary | ICD-10-CM

## 2012-05-18 LAB — GLUCOSE, CAPILLARY: Glucose-Capillary: 116 mg/dL — ABNORMAL HIGH (ref 70–99)

## 2012-05-18 LAB — CBC
HCT: 27.4 % — ABNORMAL LOW (ref 39.0–52.0)
MCV: 88.7 fL (ref 78.0–100.0)
RBC: 3.09 MIL/uL — ABNORMAL LOW (ref 4.22–5.81)
RDW: 15.9 % — ABNORMAL HIGH (ref 11.5–15.5)
WBC: 15.8 10*3/uL — ABNORMAL HIGH (ref 4.0–10.5)

## 2012-05-18 NOTE — Progress Notes (Signed)
Physical Therapy Session Note  Patient Details  Name: Connor Small MRN: [id number] Date of Birth: 1960-04-04  Today's Date: 05/18/2012 Time: 1300-1330 ([place] [id number]) Time Calculation (min): 30 min  Short Term Goals: Week 2:  PT Short Term Goal 1 (Week 2): Pt will be able to perform bed mobility with mod A for supine to sit PT Short Term Goal 2 (Week 2): Pt will be able to scoot on edge of mat (using LE's to push through) with mod A to aid with slide board transfers PT Short Term Goal 3 (Week 2): Pt will be able to propel w/c with hemi technique x 50' with mod A  Skilled Therapeutic Interventions/Progress Updates:    Session focused on slideboard transfers (w/c <-> mat with max A using LUE to push/pull on armrest and pt removing slideboard) and neuro re-ed for postural control in standing and standing tolerance using Sara Plus lift with walking sling and transfer sling (in preparation for clothing management with ADLs). Attempted standing without knee blocks, but did not have enough control so put knee blocks back on increasing time from 3 minutes to 4 min total with encouragement.   Therapy Documentation Precautions:  Precautions Precautions: Fall;Shoulder Type of Shoulder Precautions: right shoulder subluxation  Precaution Comments:  ostomy, tachycardia, Afib, Wound vac, peg tube (doesn't wear abd binder anymore) Required Braces or Orthoses: Other Brace/Splint Other Brace/Splint: right wrist splint Restrictions Weight Bearing Restrictions: No   Pain:  Reports pain when in standing in low back - rest breaks as needed.  See FIM for current functional status  Therapy/Group: Individual Therapy and Co-Treatment with OT  [name] [facility] 05/18/2012, 2:05 PM

## 2012-05-18 NOTE — Progress Notes (Signed)
Chaplain Note:  Chaplain visited with pt and pt's family.  Visit took place in hospital solarium.  Pt was in a wheelchair, awake, alert, and oriented.  Pt's wife an daughter were seated next to pt.  Family is very supportive of pt.  Chaplain provided spiritual comfort, support, and encouragement for pt and pt's family.  Pt and family expressed appreciation for chaplain support.  Chaplain will continue to follow this pt.  05/18/12 1100  Clinical Encounter Type  Visited With Patient and family together  Visit Type Follow-up;Spiritual support  Referral From Patient  Consult/Referral To Chaplain  Spiritual Encounters  Spiritual Needs Emotional  Stress Factors  Patient Stress Factors Exhausted;Financial concerns;Loss of control;Major life changes  Family Stress Factors Exhausted;Financial concerns;Loss of control;Major life changes  Jearld Lesch, Chaplain [phone]

## 2012-05-18 NOTE — Progress Notes (Signed)
Pt called to get back to bed RN was arranging assistance, RN arrived in room to find wife had already transferred pt back to bed using slide board, RN instructed wife to let staff transfer pt and the associated risks involved with transferring pt, wife verbalized an understanding

## 2012-05-18 NOTE — Progress Notes (Signed)
*  PRELIMINARY RESULTS* Vascular Ultrasound Lower extremity venous duplex has been completed.  Preliminary findings: There is an anechoic area in the proximal right calf. This could be isolated thrombosis vs muscle tear. No other evidence of thrombosis in the right or left lower extremity.    Landry Mellow, RDMS, RVT  05/18/2012, 6:11 PM

## 2012-05-18 NOTE — Progress Notes (Signed)
NUTRITION FOLLOW UP  DOCUMENTATION CODES  Per approved criteria   -Severe malnutrition in the context of acute illness or injury    Intervention:   1. Pt is consuming ~ 45% of estimated kcal needs with PO intake.  Pt's kcal intake is supported by juice/soda.  Will make adjustments to TF regimen based on protein intake as protein is critical for pt with healing and ongoing fevers.  Regimen (4/25):   80 mL bolus Vital 1.5 administered over 30 minutes at 6p.  Resume Vital 1.5 @ 80 mL/hr x 15 hrs from 7p-10a. 200 mL free water TID.  This regimen provides 1920 kcal, 86g protein Regimen (4/26): 160 mL bolus Vital 1.5 administered over 30 minutes at 6p.  Resume Vital 1.5 @ 80 mL/hr x 13 hrs from 7p-9a. 200 mL free water TID.  This regimen provides 1800 kcal, 81g protein Regimen (4/27): 237 mL bolus of Vital 1.5 administered over 30 minutes at 6p.  Resume Vital 1.5 @ 80 mL/hr x 12 hrs daily from 8p-8a.  200 mL free water TID.  This regimen provides 1795 kcal, 80g protein.  Hold for therapies.   2. Calorie count; complete.  RD to make adjustments to TFs based on progress. 3. RD to continue to follow nutrition care plan.  Nutrition Dx:   Inadequate oral intake related to poor appetite, early satiety, and lethargy as evidenced by poor meal completion. Improving.  Goal:   Intake to meet >90% of estimated nutrition needs. Met with enteral nutrition.  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 Pt with acute respiratory and renal failure with ARDS and septic shock. Pt recently discharged to CIR, however currently admitted as inpatient due to possible aspiration PNA (4/2).   Hospital course nutrition summary:  2/2 - 2/20: permissively  underfed with EN  2/21-2/26: advanced to full feeds  2/26 - 3/3: feeds held for surgery  3/4 - 3/11: resumed full feeds  3/11 - 3/12: feeds held for GI bleed  3/13 - 3/24: received full feeds  3/24: TFs discontinued diet initiated; pt with very poor oral intake; tx to rehab 3/25  3/28 - 4/1: increased n/v - pt refusing almost all meals and supplements; wife declined calorie count, requesting TPN  4/2: NGT placed to suction, surgery ordering TF initiation; tx back to acute with plans for trickle feedings.  4/3-4/4: Trickle feeds of Vital 1.2 @ 30 mL/hr  4/5: PEG placement, resume of Vital 1.2  4/6-4/9: Inconsistent TF infusion and frequent adjustment of rate. Pt has not yet reached >40 mL/hr with tolerance. Wt now 151 lbs.  4/10-11: Pt has advanced as scheduled to 60 mL/hr. Discussed RN. Plan to increase to 70 mL this afternoon.  4/14: Pt has reached goal TF rate of 75 mL/hr with tolerance. PO intake remains insufficient with improvement per pt's report.  4/15: Pt ready to begin transition to cyclic feeds. RD to order Vital 1.2 @ 85 mL/hr x21 hrs daily. Will continue with daily advancements as tolerated and shorter duration of tubefeeds.  4/16: Pt agreeable to change formula to Vital 1.5 @ 80 mL/hr x 18 hrs daily.  4/17: Transfer to CIR. Continues with order of Vital 1.5 @ 80 ml/hr x 18 hrs daily. 4/21: Initiation of 3-day  calorie count; continue Vital 1.5 @ 80 ml/hr x 18 hrs daily. 4/22: Day 1 Calorie Count Results -824 kcal (37% of minimum estimated needs) and 13.5 protein (11% of minimum estimated needs) 4/25:  Pt continues cyclic regimen.  Calorie count complete.  Wife and patient informed of results.  Will decreased cyclic feeds overnight and start transition to bolus feeds. Changes in regimen based on calorie count results are listed in interventions above.  Calorie Count Results:  Breakfast: 225 kcal, 7 grams protein Lunch: 530 kcal, 16 grams protein Dinner: 210 kcal, 7 grams  protein  Total intake: 965 kcal (45% of minimum estimated needs)  30 protein (25% of minimum estimated needs)  Discussed calorie count results with wife and patient.  Discussed improvements in PO intake despite persistent poor appetite.  Discussed long-term goal of using the PEG prn with bolus feeds if able to accomplish.  Pt's greatest barrier to tolerance is nausea especially at high rates.  Discussed using meal completion as benchmark- such as 1 can of Vital 1.5/meal if </=50% of meal consumed, however wife states that set bolus time would be preferable. Both pt and wife state fear of increasing amount of TF infused.  RD discussed volumes pt is currently taking PO and will make incremental advancements over the weekend to goal of 1 can.  Will initiate bolus of 80 mL Vital 1.5 @ 1800 today after dinner meal- to be bolused over 30 minutes using pump.  Increase bolus amount by 80 mL q day 160 mL Saturday, 237 mL Sunday.  If pt is able to tolerate bolus of entire can RD will reassess on Monday, will continue to progress regimen to bolus feeds.  Working towards d/c date of 5/14. Fevers and general malaise last night and this AM. Most recent temperature is 97.6 degrees F. CXR (4/23) revealed some increase in R pleural effusion, large L pleural effusion unchanged. Bilateral airspace disease persists.   Height: Ht Readings from Last 1 Encounters:  05/09/12 5' 10"  (1.778 m)    Weight Status:   Wt Readings from Last 1 Encounters:  05/18/12 149 lb 11.1 oz (67.9 kg)  Wt variable  Re-estimated needs:  Kcal: 2200 - 2400 Protein: 120 - 140 grams Fluid: > 2.2 liters  Skin: incisions, ileostomy, stage II pressure ulcer to R buttocks  Diet Order: General   Intake/Output Summary (Last 24 hours) at 05/18/12 1346 Last data filed at 05/18/12 1000  Gross per 24 hour  Intake    720 ml  Output   1975 ml  Net  -1255 ml    Last BM: 4/25 (775 ml output today via ostomy 4/24 which remain  appropriate)   Labs:   Recent Labs Lab 05/14/12 0803  NA 134*  K 3.6  CL 97  CO2 25  BUN 19  CREATININE 0.70  CALCIUM 8.5  GLUCOSE 153*    CBG (last 3)   Recent Labs  05/17/12 2109 05/18/12 0727 05/18/12 1218  GLUCAP 105* 114* 100*    Scheduled Meds: . aspirin  81 mg Per Tube Daily  . chlorhexidine  15 mL Mouth Rinse BID  . clonazePAM  0.5 mg Oral QHS  . darbepoetin (ARANESP) injection - NON-DIALYSIS  40 mcg Subcutaneous Q Tue-1800  . ferrous sulfate  300 mg Per Tube TID WC  . free water  200 mL Per Tube Q3H  . insulin aspart  0-10 Units Subcutaneous TID WC  . insulin aspart  0-5 Units Subcutaneous QHS  . levofloxacin  750 mg Oral q1800  . lidocaine  1 patch Transdermal Q24H  . lip balm   Topical BID  . loperamide  2 mg Per Tube QHS  . loperamide  2-4 mg Per Tube UD  . metoCLOPramide  10 mg Oral TID AC  . metoprolol tartrate  6.25 mg Per Tube Daily  . multivitamin  5 mL Per Tube Daily  . pantoprazole sodium  40 mg Per Tube Q1200  . saccharomyces boulardii  250 mg Oral BID    Continuous Infusions: . feeding supplement (VITAL 1.5 CAL) 1,000 mL (05/17/12 1755)    Brynda Greathouse, MS RD LDN Clinical Inpatient Dietitian Pager: [phone] Weekend/After hours pager: [phone]

## 2012-05-18 NOTE — Progress Notes (Signed)
Occupational Therapy Weekly Progress Note & Session Notes  Patient Details  Name: BRAVLIO MURAD MRN: [id number] Date of Birth: 05-09-1960  Today's Date: 05/18/2012  WEEKLY PROGRESS NOTE Patient has met 3 of 5 short term goals. Patient is making steady progress on CIR. Patient is progress towards self-feeding and grooming goals, but did not met the set self-feeding & grooming short term goals. Patient is able to maintain dynamic sitting balance with close supervision and has been improving with bed mobility tasks. Patient is now sitting edge of bed to donn shirt and pants; performing lateral leans with assistance from therapist to pull pants up to waist. Therapist has taught wife UE and LE AAROM/stretching exercises to help decrease overall pain and help improve muscle integrity.   Patient continues to demonstrate the following deficits: decreased overall activity tolerance/endurance, decreased independence with ADLs/IADLs, decreased independence with bed mobility, decreased independence with functional mobility, increased pain. Therefore, patient will continue to benefit from skilled OT intervention to enhance overall performance with BADL, iADL and Reduce care partner burden.  Patient progressing toward long term goals..  Continue plan of care.  OT Short Term Goals Week 1:  OT Short Term Goal 1 (Week 1): Self Feeding:  Patient will feed himself at least 50% of 2 meals per day during this recording period. OT Short Term Goal 1 - Progress (Week 1): Progressing toward goal OT Short Term Goal 2 (Week 1): Grooming:  Patient will perform 2 grooming tasks with min assist to include set up once items needed are provided OT Short Term Goal 2 - Progress (Week 1): Progressing toward goal OT Short Term Goal 3 (Week 1): UB Dressing:  Patient will don shirt in supported sit with max assist OT Short Term Goal 3 - Progress (Week 1): Met OT Short Term Goal 4 (Week 1): Sitting balance:  Patient will sit without  back support with mod assist while completing simple BADL task at least 2 days this recording period. OT Short Term Goal 4 - Progress (Week 1): Met OT Short Term Goal 5 (Week 1): UE exercises:  Patient will tolerate BUE exercises at least 4 times during this recording period. OT Short Term Goal 5 - Progress (Week 1): Met  Week 2:  OT Short Term Goal 1 (Week 2): Patient will perform grooming tasks with minimal assistance OT Short Term Goal 2 (Week 2): Patient will perform LB dressing with moderate assistance (pants only) OT Short Term Goal 3 (Week 2): Patient will be able to direct care with slide board transfer with min verbal cues OT Short Term Goal 4 (Week 2): Patient will be able to direct UE exercises with min verbal cues  Skilled Therapeutic Interventions/Progress Updates:  Balance/vestibular training;Cognitive remediation/compensation;Community reintegration;Discharge planning;DME/adaptive equipment instruction;Functional electrical stimulation;Functional mobility training;Neuromuscular re-education;Patient/family education;Pain management;Psychosocial support;Self Care/advanced ADL retraining;Skin care/wound managment;Splinting/orthotics;Therapeutic Activities;Therapeutic Exercise;UE/LE Strength taining/ROM;UE/LE Coordination activities;Wheelchair propulsion/positioning   Precautions:  Precautions Precautions: Fall;Shoulder Type of Shoulder Precautions: right shoulder subluxation  Precaution Comments:  ostomy, tachycardia, Afib, Wound vac, peg tube (doesn't wear abd binder anymore) Required Braces or Orthoses: Other Brace/Splint Other Brace/Splint: right wrist splint Restrictions Weight Bearing Restrictions: No  See FIM for current functional status  -------------------------------------------------------------------------------------------------------  SESSION NOTES  Session #1 [id number] - 60 Minutes Individual Therapy No complaints of pain except during stretching exercises   Patient found supine in bed. Engaged in UE and LE AAROM/stretching exercises with wife assisting as well. Patient then engaged in bed mobility, dynamic sitting balance seated edge of bed, UB/LB  dressing sitting edge of bed, and edge of bed -> w/c slide board transfer with max assist. Therapist then assisted with w/c positioning and left patient seated in w/c with wife present.   Session #2 [id number] - 55 minutes co-treatment with physical therapy [id number] - 25 Minutes Calculated OT time No complaints of pain, except stiffness/muscular pain Co-Treatment with physical therapist focusing on standing patient  in SARA PLUS machine. Used walking sling to stand patient as well as regular sling. Patient required knee support be in place for safe & effective sit<>stands. Plan to incorporate SARA PLUS in ADL for patient to stand in order to pull pants up to waist. Patient somewhat anxious during session and with complaints of back pain when standing. Patient was able to stand with SARA PLUS for up to 4 minutes.   Kanaya Gunnarson 05/18/2012, 9:50 AM

## 2012-05-18 NOTE — Progress Notes (Signed)
Speech Language Pathology Daily Session Note  Patient Details  Name: Connor Small MRN: [id number] Date of Birth: 1960-11-28  Today's Date: 05/18/2012 Time: 0800-0825 Time Calculation (min): 25 min  Short Term Goals: Week 2: SLP Short Term Goal 1 (Week 2): Pt will perform vocal function exercises with Mod I.  SLP Short Term Goal 2 (Week 2): Pt will demonstrate functional problem solving with basic and familiar tasks with supervision verbal cues.  SLP Short Term Goal 3 (Week 2): Pt will utilize external memory aids to recall new, daily information with supervision verbal cues.  SLP Short Term Goal 4 (Week 2): pt will demonstrate selective attention to a functional task for 30 minutes with min verbal cues.  SLP Short Term Goal 5 (Week 2): Pt will initiate functional tasks with supervision verbal and question cues.   Skilled Therapeutic Interventions: Treatment focus on cognitive goals. SLP facilitated session by providing 6 step sequencing pictures with focus on sustained attention, functional problem solving, organization and sequencing. Pt completed task with 100% accuracy with supervision verbal cues for problem solving and Min A verbal cues to facilitate movement of cards to sequence.    FIM:  Comprehension Comprehension Mode: Auditory Comprehension: 6-Follows complex conversation/direction: With extra time/assistive device Expression Expression Mode: Verbal Expression: 6-Expresses complex ideas: With extra time/assistive device Social Interaction Social Interaction: 5-Interacts appropriately 90% of the time - Needs monitoring or encouragement for participation or interaction. Problem Solving Problem Solving: 5-Solves basic 90% of the time/requires cueing < 10% of the time Memory Memory: 5-Recognizes or recalls 90% of the time/requires cueing < 10% of the time  Pain No/Denies Pain  Therapy/Group: Individual Therapy  Bartolo Montanye 05/18/2012, 3:52 PM

## 2012-05-18 NOTE — Progress Notes (Signed)
Subjective/Complaints: Persistent low grade temp. Headache better. A 12 point review of systems has been performed and if not noted above is otherwise negative.   Objective: Vital Signs: Blood pressure 95/69, pulse 123, temperature 97.6 F (36.4 C), temperature source Oral, resp. rate 18, height 5' 10"  (1.778 m), weight 67.9 kg (149 lb 11.1 oz), SpO2 96.00%. Dg Chest 2 View  05/16/2012  *RADIOLOGY REPORT*  Clinical Data: Cough and fever.  CHEST - 2 VIEW  Comparison: Single view of the chest 04/24/2012 and CT abdomen and pelvis 04/24/2012.  Findings: Large left and small right pleural effusion are again seen.  Right pleural effusion appears increased.  There is associated left worse than right airspace disease.  Heart size is normal.  No pneumothorax.  IMPRESSION: Some increase in a right pleural effusion.  Large left pleural effusion is unchanged.  Bilateral airspace disease persists.   Original Report Authenticated By: Orlean Patten, M.D.    No results found for this basename: WBC, HGB, HCT, PLT,  in the last 72 hours No results found for this basename: NA, K, CL, CO, GLUCOSE, BUN, CREATININE, CALCIUM,  in the last 72 hours CBG (last 3)   Recent Labs  05/17/12 1639 05/17/12 2109 05/18/12 0727  GLUCAP 97 105* 114*    Wt Readings from Last 3 Encounters:  05/18/12 67.9 kg (149 lb 11.1 oz)  05/07/12 66.2 kg (145 lb 15.1 oz)  04/18/12 71.94 kg (158 lb 9.6 oz)    Physical Exam:  Constitutional: He appears well-developed. He has a sickly appearance.   HENT:  Head: Normocephalic and atraumatic.  Eyes: Pupils are equal, round, and reactive to light.  Neck: Normal range of motion.  Cardiovascular: Regular rhythm. Tachycardia present.  HR 100's during the exam.  Pulmonary/Chest: Effort normal. No respiratory distress.  He has no wheezes. Decreased sounds at the bases. Abdominal: He exhibits minimal distension. Bowel sounds are increased. There is tenderness.  Midline incision with  retention sutures in place--dehisced incision with pink, clean wound bed.  . ---continuing to close nicely--central area still with some depth Neurological: He is alert.   Basic insight and awareness. Was able to follow basic commands. Hoarse voice. 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 UE grossly  2to 3/5 LUE, 1-2/5 RUE with contracture in Hand (limited flexion). LE 1-2/5with weakness moreso in ankle dorsiflexion. DTR's 1+. Diffuse muscle wasting.  Skin: Skin is warm and dry.  Psychiatric: relaxed  Assessment/Plan:  1. 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 Physiatrist is providing close team supervision and 24 hour management of active medical problems listed below. Physiatrist and rehab team continue to assess barriers to discharge/monitor patient progress toward functional and medical goals.  FIM: FIM - Bathing Bathing Steps Patient Completed: Chest;Right Arm;Abdomen;Front perineal area;Right upper leg;Left upper leg Bathing: 3: Mod-Patient completes 5-7 59f10 parts or 50-74% (supine for LB in w/c for upper body)  FIM - Upper Body Dressing/Undressing Upper body dressing/undressing steps patient completed: Thread/unthread right sleeve of pullover shirt/dresss;Thread/unthread left sleeve of pullover shirt/dress Upper body dressing/undressing: 3: Mod-Patient completed 50-74% of tasks FIM - Lower Body Dressing/Undressing Lower body dressing/undressing: 1: Total-Patient completed less than 25% of tasks  FIM - Toileting Toileting: 0: Activity did not occur  FIM - TAir cabin crewTransfers: 0-Activity did not occur  FIM - BControl and instrumentation engineerDevices: Sliding board;Arm rests Bed/Chair Transfer: 2: Chair  or W/C > Bed: Max A (lift and lower assist);2: Bed > Chair or W/C: Max  A (lift and lower assist)  FIM - Locomotion: Wheelchair Locomotion: Wheelchair: 1: Travels less than 50 ft with moderate assistance (Pt: 50 - 74%) FIM - Locomotion: Ambulation Ambulation/Gait Assistance: Not tested (comment) Locomotion: Ambulation: 0: Activity did not occur  Comprehension Comprehension Mode: Auditory Comprehension: 6-Follows complex conversation/direction: With extra time/assistive device  Expression Expression Mode: Verbal Expression: 5-Expresses complex 90% of the time/cues < 10% of the time  Social Interaction Social Interaction Mode: Not assessed Social Interaction: 4-Interacts appropriately 75 - 89% of the time - Needs redirection for appropriate language or to initiate interaction.  Problem Solving Problem Solving Mode: Not assessed Problem Solving: 5-Solves basic 90% of the time/requires cueing < 10% of the time  Memory Memory Mode: Not assessed Memory: 5-Recognizes or recalls 90% of the time/requires cueing < 10% of the time  Medical Problem List and Plan:  1. DVT Prophylaxis/Anticoagulation: Mechanical: Sequential compression devices, below knee Bilateral lower extremities thrombocytopenia resolved.   2. Multifactorial pain: tylenol, local measures, use ultram for more severe pain, HA 3. Mood: neuropsych consult requested. Needs encouragement  -klonopin to continue 4. Neuropsych: This patient is not capable of making decisions on his/her own behalf.  5. FEN::   Serial labs, adjusting fluids, formula as well. Potassium slightly elevated--recheck Monday  -change to boluses at some point depending upon PO intake 6. Bilateral cardioembolic CVA: no blood thinners due to issues with bleeding 7. ABLA:  Aranesp, feso4--  -recheck cbc   -no signs of blood loss 8. 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 Protect heels, stretch heel cords  -AFO's at some point? 9. Low grade temp- urine culture negative, cxr with  increased effusion, ?infiltrate  -continue levaquin  -follow up cxr, check labs today  -IS  -recheck dopplers today  LOS (Days) 9 A FACE TO FACE EVALUATION WAS PERFORMED  SWARTZ,ZACHARY T 05/18/2012 7:52 AM

## 2012-05-19 ENCOUNTER — Inpatient Hospital Stay (HOSPITAL_COMMUNITY): Payer: BC Managed Care – PPO

## 2012-05-19 LAB — GLUCOSE, CAPILLARY
Glucose-Capillary: 97 mg/dL (ref 70–99)
Glucose-Capillary: 131 mg/dL — ABNORMAL HIGH (ref 70–99)
Glucose-Capillary: 94 mg/dL (ref 70–99)

## 2012-05-19 NOTE — Progress Notes (Signed)
Patient ID: [name], male   DOB: [birth year], 52 y.o.   MRN: [id number] Subjective/Complaints:  48/55. 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  Status post multi-territorial CVA. Hospital course complicated by coagulopathy, electrolyte abnormalities with hypernatremia and hospital-acquired pneumonia. The patient is status post paracentesis.  History of ulcerative colitis status post total colectomy with ileostomy. Persistent low grade temp and tachycardia.  Headache better. A 12 point review of systems has been performed and if not noted above is otherwise negative.  HEENT-oropharynx benign; neck no neck vein distention; chest clear anterolaterally; cardiovascular resting tachycardia; abdomen status post ileostomy; extremities-no edema weakness with muscle wasting; SCDs in place  CBG (last 3)   Recent Labs  05/18/12 1651 05/18/12 2126 05/19/12 0730  GLUCAP 97 116* 131*   Impression- severe deconditioning; status post multi-territorial embolic strokes; history of aspiration pneumonitis-continue Levaquin   Objective: Vital Signs: Blood pressure 120/72, pulse 128, temperature 98.2 F (36.8 C), temperature source Oral, resp. rate 20, height 5\' 10"  (1.778 m), weight 149 lb 11.1 oz (67.9 kg), SpO2 98.00%. No results found.  Recent Labs  05/18/12 0935  WBC 15.8*  HGB 9.1*  HCT 27.4*  PLT 503*   No results found for this basename: NA, K, CL, CO, GLUCOSE, BUN, CREATININE, CALCIUM,  in the last 72 hours CBG (last 3)   Recent Labs  05/18/12 1651 05/18/12 2126 05/19/12 0730  GLUCAP 97 116* 131*    Wt Readings from Last 3 Encounters:  05/18/12 149 lb 11.1 oz (67.9 kg)  05/07/12 145 lb 15.1 oz (66.2 kg)  04/18/12 158 lb 9.6 oz (71.94 kg)    Physical Exam:  Constitutional: He appears well-developed. He has a sickly appearance.   HENT:  Head: Normocephalic and atraumatic.  Eyes: Pupils are equal, round, and reactive to light.  Neck: Normal  range of motion.  Cardiovascular: Regular rhythm. Tachycardia present.  HR 100's during the exam.  Pulmonary/Chest: Effort normal. No respiratory distress.  He has no wheezes. Decreased sounds at the bases. Abdominal: He exhibits minimal distension. Bowel sounds are increased. There is tenderness.  Midline incision with retention sutures in place--dehisced incision with pink, clean wound bed.  . ---continuing to close nicely--central area still with some depth Neurological: He is alert.   Basic insight and awareness. Was able to follow basic commands. Hoarse voice. 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 UE grossly  2to 3/5 LUE, 1-2/5 RUE with contracture in Hand (limited flexion). LE 1-2/5with weakness moreso in ankle dorsiflexion. DTR's 1+. Diffuse muscle wasting.  Skin: Skin is warm and dry.  Psychiatric: relaxed  Assessment/Plan:  1. 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 Physiatrist is providing close team supervision and 24 hour management of active medical problems listed below. Physiatrist and rehab team continue to assess barriers to discharge/monitor patient progress toward functional and medical goals.  FIM: FIM - Bathing Bathing Steps Patient Completed: Chest;Right Arm;Abdomen;Front perineal area;Right upper leg;Left upper leg Bathing: 3: Mod-Patient completes 5-7 68f 10 parts or 50-74% (supine for LB in w/c for upper body)  FIM - Upper Body Dressing/Undressing Upper body dressing/undressing steps patient completed: Thread/unthread right sleeve of pullover shirt/dresss;Thread/unthread left sleeve of pullover shirt/dress Upper body dressing/undressing: 3: Mod-Patient completed 50-74% of tasks FIM - Lower Body Dressing/Undressing Lower body dressing/undressing: 2: Max-Patient completed 25-49% of  tasks  FIM - Toileting Toileting: 0:  Activity did not occur  FIM - Archivist Transfers: 0-Activity did not occur  FIM - Banker Devices: Sliding board;Arm rests Bed/Chair Transfer: 2: Chair or W/C > Bed: Max A (lift and lower assist);2: Bed > Chair or W/C: Max A (lift and lower assist)  FIM - Locomotion: Wheelchair Locomotion: Wheelchair: 1: Travels less than 50 ft with moderate assistance (Pt: 50 - 74%) FIM - Locomotion: Ambulation Ambulation/Gait Assistance: Not tested (comment) Locomotion: Ambulation: 0: Activity did not occur  Comprehension Comprehension Mode: Auditory Comprehension: 6-Follows complex conversation/direction: With extra time/assistive device  Expression Expression Mode: Verbal Expression: 6-Expresses complex ideas: With extra time/assistive device  Social Interaction Social Interaction Mode: Not assessed Social Interaction: 5-Interacts appropriately 90% of the time - Needs monitoring or encouragement for participation or interaction.  Problem Solving Problem Solving Mode: Not assessed Problem Solving: 5-Solves basic 90% of the time/requires cueing < 10% of the time  Memory Memory Mode: Not assessed Memory: 5-Recognizes or recalls 90% of the time/requires cueing < 10% of the time  Medical Problem List and Plan:  1. DVT Prophylaxis/Anticoagulation: Mechanical: Sequential compression devices, below knee Bilateral lower extremities thrombocytopenia resolved.   2. Multifactorial pain: tylenol, local measures, use ultram for more severe pain, HA 3. Mood: neuropsych consult requested. Needs encouragement  -klonopin to continue 4. Neuropsych: This patient is not capable of making decisions on his/her own behalf.  5. FEN::   Serial labs, adjusting fluids, formula as well. Potassium slightly elevated--recheck Monday  -change to boluses at some point depending upon PO intake 6. Bilateral cardioembolic CVA: no  blood thinners due to issues with bleeding 7. ABLA:  Aranesp, feso4--  -recheck cbc   -no signs of blood loss 8. 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 Protect heels, stretch heel cords  -AFO's at some point? 9. Low grade temp- urine culture negative, cxr with increased effusion, ?infiltrate  -continue levaquin  -follow up cxr, check labs today  -IS  -recheck dopplers today  LOS (Days) 10 A FACE TO FACE EVALUATION WAS PERFORMED  Rogelia Boga 05/19/2012 8:28 AM

## 2012-05-19 NOTE — Progress Notes (Signed)
Occupational Therapy Session Note  Patient Details  Name: Connor Small MRN: [id number] Date of Birth: Apr 03, 1960  Today's Date: 05/19/2012 Time: 1445-1530 Time Calculation (min): 45 min  Short Term Goals: Week 2:  OT Short Term Goal 1 (Week 2): Patient will perform grooming tasks with minimal assistance OT Short Term Goal 2 (Week 2): Patient will perform LB dressing with moderate assistance (pants only) OT Short Term Goal 3 (Week 2): Patient will be able to direct care with slide board transfer with min verbal cues OT Short Term Goal 4 (Week 2): Patient will be able to direct UE exercises with min verbal cues  Skilled Therapeutic Interventions: Therapeutic activities with emphasis on passive and active ROM of right UE, re-ed on functional hand/finger stretches, and functional hand exercises.   Patient tolerated PROM to shoulder and elbow but required hand-over-hand assist with attempting to form a composite fist; patient lacks composite flexion (finger tip to palm, approx 2 inches).  Patient with limited flexion of IP joints at thumb, long, and ring fingers due to tightness and pain.   Written exercises provided specific to finger PROM and functional hand activities.  Therapy Documentation Precautions:  Precautions Precautions: Fall;Shoulder Type of Shoulder Precautions: right shoulder subluxation  Precaution Comments:  ostomy, tachycardia, Afib, Wound vac, peg tube (doesn't wear abd binder anymore) Required Braces or Orthoses: Other Brace/Splint Other Brace/Splint: right wrist splint Restrictions Weight Bearing Restrictions: No  Pain: Pain Assessment Pain Assessment: 0-10 Pain Score:  (better) Pain Type: Neuropathic pain Pain Location: Arm Pain Orientation: Right Pain Descriptors: Discomfort Patients Stated Pain Goal: 0 Pain Intervention(s): Therapeutic touch;Repositioned;Distraction Multiple Pain Sites: No  See FIM for current functional status  Therapy/Group: Individual  Therapy  Maryan Puls 05/19/2012, 3:48 PM

## 2012-05-20 ENCOUNTER — Inpatient Hospital Stay (HOSPITAL_COMMUNITY): Payer: BC Managed Care – PPO | Admitting: Physical Therapy

## 2012-05-20 ENCOUNTER — Inpatient Hospital Stay (HOSPITAL_COMMUNITY): Payer: BC Managed Care – PPO | Admitting: *Deleted

## 2012-05-20 LAB — GLUCOSE, CAPILLARY
Glucose-Capillary: 101 mg/dL — ABNORMAL HIGH (ref 70–99)
Glucose-Capillary: 111 mg/dL — ABNORMAL HIGH (ref 70–99)
Glucose-Capillary: 113 mg/dL — ABNORMAL HIGH (ref 70–99)
Glucose-Capillary: 118 mg/dL — ABNORMAL HIGH (ref 70–99)
Glucose-Capillary: 138 mg/dL — ABNORMAL HIGH (ref 70–99)

## 2012-05-20 LAB — RETICULOCYTES
RBC.: 2.72 MIL/uL — ABNORMAL LOW (ref 4.22–5.81)
Retic Count, Absolute: 35.4 10*3/uL (ref 19.0–186.0)

## 2012-05-20 LAB — CBC
HCT: 23.7 % — ABNORMAL LOW (ref 39.0–52.0)
Hemoglobin: 8 g/dL — ABNORMAL LOW (ref 13.0–17.0)
MCH: 29.4 pg (ref 26.0–34.0)
MCHC: 33.8 g/dL (ref 30.0–36.0)
RBC: 2.72 MIL/uL — ABNORMAL LOW (ref 4.22–5.81)

## 2012-05-20 NOTE — Progress Notes (Signed)
Continues to complain of intermittent nausea, no vomiting since yesterday. Peg residuals-zero, 10, and 30. 577m out of ostomy, watery stool. MPatrici RanksA

## 2012-05-20 NOTE — Progress Notes (Signed)
Patient ID: [name], male   DOB: 1960/05/31, 52 y.o.   MRN: [id number]   Patient ID: OSEAS ANSTEAD, male   DOB: 08-Apr-1960, 52 y.o.   MRN: [id number] Subjective/Complaints:  57/[birth year]. 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  Status post multi-territorial CVA. Hospital course complicated by coagulopathy, electrolyte abnormalities with hypernatremia and hospital-acquired pneumonia. The patient is status post paracentesis.  History of ulcerative colitis status post total colectomy with ileostomy. Persistent low grade temp and tachycardia. Weak but no focal complaints; alert and no distress. CBC with a white count down to 13.1. Hemoglobin also down to 8.0. A 12 point review of systems has been performed and if not noted above is otherwise negative.  HEENT-oropharynx benign; neck no neck vein distention; chest clear anterolaterally; cardiovascular resting tachycardia; abdomen status post ileostomy; extremities-no edema weakness with muscle wasting; SCDs in place  CBG (last 3)   Recent Labs  05/19/12 1751 05/19/12 1955 05/20/12 0010  GLUCAP 94 100* 118*   Impression- severe deconditioning; status post multi-territorial embolic strokes; history of aspiration pneumonitis-continue Levaquin Leukocytosis improved Worsening anemia   A repeat CBC and electrolytes in the morning  Objective: Vital Signs: Blood pressure 103/69, pulse 120, temperature 99.2 F (37.3 C), temperature source Oral, resp. rate 18, height 5\' 10"  (1.778 m), weight 172 lb 13.5 oz (78.4 kg), SpO2 94.00%. No results found.  Recent Labs  05/18/12 0935 05/20/12 0614  WBC 15.8* 13.1*  HGB 9.1* 8.0*  HCT 27.4* 23.7*  PLT 503* 472*   No results found for this basename: NA, K, CL, CO, GLUCOSE, BUN, CREATININE, CALCIUM,  in the last 72 hours CBG (last 3)   Recent Labs  05/19/12 1751 05/19/12 1955 05/20/12 0010  GLUCAP 94 100* 118*    Wt Readings from Last 3 Encounters:  05/20/12  172 lb 13.5 oz (78.4 kg)  05/07/12 145 lb 15.1 oz (66.2 kg)  04/18/12 158 lb 9.6 oz (71.94 kg)    Physical Exam:  Constitutional: He appears well-developed. He has a sickly appearance.   HENT:  Head: Normocephalic and atraumatic.  Eyes: Pupils are equal, round, and reactive to light.  Neck: Normal range of motion.  Cardiovascular: Regular rhythm. Tachycardia present.  HR 100's during the exam.  Pulmonary/Chest: Effort normal. No respiratory distress.  He has no wheezes. Decreased sounds at the bases. Abdominal: He exhibits minimal distension. Bowel sounds are increased. There is tenderness.  Midline incision with retention sutures in place--dehisced incision with pink, clean wound bed.  . ---continuing to close nicely--central area still with some depth Neurological: He is alert.   Basic insight and awareness. Was able to follow basic commands. Hoarse voice. 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 UE grossly  2to 3/5 LUE, 1-2/5 RUE with contracture in Hand (limited flexion). LE 1-2/5with weakness moreso in ankle dorsiflexion. DTR's 1+. Diffuse muscle wasting.  Skin: Skin is warm and dry.  Psychiatric: relaxed  Assessment/Plan:  1. 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 Physiatrist is providing close team supervision and 24 hour management of active medical problems listed below. Physiatrist and rehab team continue to assess barriers to discharge/monitor patient progress toward functional and medical goals.  FIM: FIM - Bathing Bathing Steps Patient Completed: Chest;Right Arm;Abdomen;Front perineal area;Right upper leg;Left upper leg Bathing: 3: Mod-Patient completes 5-7 29f 10 parts or 50-74% (supine for LB in w/c  for upper body)  FIM - Upper Body Dressing/Undressing Upper body dressing/undressing steps  patient completed: Thread/unthread right sleeve of pullover shirt/dresss;Thread/unthread left sleeve of pullover shirt/dress Upper body dressing/undressing: 3: Mod-Patient completed 50-74% of tasks FIM - Lower Body Dressing/Undressing Lower body dressing/undressing: 2: Max-Patient completed 25-49% of tasks  FIM - Toileting Toileting: 6: Assistive device: No helper  FIM - Archivist Transfers: 0-Activity did not occur  FIM - Banker Devices: Sliding board;Arm rests Bed/Chair Transfer: 2: Bed > Chair or W/C: Max A (lift and lower assist)  FIM - Locomotion: Wheelchair Locomotion: Wheelchair: 1: Travels less than 50 ft with moderate assistance (Pt: 50 - 74%) FIM - Locomotion: Ambulation Ambulation/Gait Assistance: Not tested (comment) Locomotion: Ambulation: 0: Activity did not occur  Comprehension Comprehension Mode: Auditory Comprehension: 6-Follows complex conversation/direction: With extra time/assistive device  Expression Expression Mode: Verbal Expression: 5-Expresses basic 90% of the time/requires cueing < 10% of the time.  Social Interaction Social Interaction Mode: Not assessed Social Interaction: 4-Interacts appropriately 75 - 89% of the time - Needs redirection for appropriate language or to initiate interaction.  Problem Solving Problem Solving Mode: Not assessed Problem Solving: 5-Solves basic 90% of the time/requires cueing < 10% of the time  Memory Memory Mode: Not assessed Memory: 5-Recognizes or recalls 90% of the time/requires cueing < 10% of the time  Medical Problem List and Plan:  1. DVT Prophylaxis/Anticoagulation: Mechanical: Sequential compression devices, below knee Bilateral lower extremities thrombocytopenia resolved.   2. Multifactorial pain: tylenol, local measures, use ultram for more severe pain, HA 3. Mood: neuropsych consult requested. Needs encouragement  -klonopin to continue 4.  Neuropsych: This patient is not capable of making decisions on his/her own behalf.  5. FEN::   Serial labs, adjusting fluids, formula as well. Potassium slightly elevated--recheck Monday  -change to boluses at some point depending upon PO intake 6. Bilateral cardioembolic CVA: no blood thinners due to issues with bleeding 7. ABLA:  Aranesp, feso4--  -recheck cbc   -no signs of blood loss 8. 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 Protect heels, stretch heel cords  -AFO's at some point? 9. Low grade temp- urine culture negative, cxr with increased effusion, ?infiltrate  -continue levaquin  -follow up cxr, check labs today  -IS  -recheck dopplers today  LOS (Days) 11 A FACE TO FACE EVALUATION WAS PERFORMED  Rogelia Boga 05/20/2012 8:02 AM

## 2012-05-20 NOTE — Progress Notes (Signed)
Physical Therapy Note  Patient Details  Name: Connor Small MRN: [id number] Date of Birth: 1960/03/19 Today's Date: 05/20/2012  4098-1191 (35 minutes) individual Pain: pt reports unrated back pain in standing/premedicated Focus of treatment: transfer training; standing tolerance Treatment: Pt in bed upon arrival; supine to side (rolling to left) max assist; left side to sit mod/max assist; transfer with sliding board max assist +1 with vcs to maintain forward trunk flexion; sit to stand in standing frame X 3 for approximately 2-3 minutes per session before requesting to sit (fatigue). While standing pt performing glut/quad sets. Wife present.    Connor Small,JIM 05/20/2012, 9:31 AM

## 2012-05-20 NOTE — Progress Notes (Signed)
MEDICATION RELATED CONSULT NOTE - FOLLOW UP   Pharmacy Consult for Aranesp Indication: Anemia of chronic disease  Allergies  Allergen Reactions  . Penicillins Other (See Comments)    Heart rate changes  . Morphine And Related     Nausea/vomiting  . Oxycodone     Nausea/vomiting  . Imipenem Rash    Patient Measurements: Height: 5' 10"  (177.8 cm) Weight: 172 lb 13.5 oz (78.4 kg) IBW/kg (Calculated) : 73  Vital Signs: Temp: 99.2 F (37.3 C) (04/27 0459) Temp src: Oral (04/27 0459) BP: 103/69 mmHg (04/27 0800) Pulse Rate: 120 (04/27 0800) Intake/Output from previous day: 04/26 0701 - 04/27 0700 In: 240 [P.O.:240] Out: 2125 [Urine:1300; Stool:825] Intake/Output from this shift:    Labs:  Recent Labs  05/18/12 0935 05/20/12 0614  WBC 15.8* 13.1*  HGB 9.1* 8.0*  HCT 27.4* 23.7*  PLT 503* 472*   Estimated Creatinine Clearance: 109 ml/min (by C-G formula based on Cr of 0.7).   Assessment: Patient continues on Aranesp started 4/5 (40 mcg SQ weekly), H/H is trending down, retic count also down to 35.4 from 43.8 and originally 61.7 Noted iron stores are low, patient continues on iron supplementation, but iron deficit of 1.6gm has not yet been repleted.   4/8 Aranesp 40 @ 1720 4/15 Aranesp 40 @ 2044 4/22 Aranesp 40 @ 2029  Goal of Therapy:  Hgb increase of at least 1 gm/dL after 4 weeks of Aranesp   Plan:  - MD please give IV iron to replace deficit as Aranesp will not increase Hgb if patient is iron depleted  - Will continue current Aranesp regimen, last dose due on 4/29 per pharmacy anemia protocol.  Thanks, Stachia Slutsky K. Posey Pronto, PharmD, BCPS.  Clinical Pharmacist Pager [phone]. 05/20/2012 8:51 AM

## 2012-05-20 NOTE — Progress Notes (Signed)
Occupationall Therapy Note  Patient Details  Name: Connor Small MRN: [id number] Date of Birth: 1960/10/20 Today's Date: 05/20/2012 Pain:  None Individual Session Time:  1430- 1500  (30 min)  Engaged in therapeutic exercises for BUE with AROM, AAROM, PROM, pressure relief strategies.  Pt has some popping in right shoulder with horizontal shoulder PROM.  Pt. Attributed it to subluxation.  Palpated shoulder and felt minimal subluxation in any plane.  Did pressure relief with chair tilting for 5 minutes.   Wife, Estill Bamberg and daughter in room and discussed exercises and shoulder position.   Lisa Roca 05/20/2012, 3:45 PM

## 2012-05-21 ENCOUNTER — Inpatient Hospital Stay (HOSPITAL_COMMUNITY): Payer: BC Managed Care – PPO

## 2012-05-21 ENCOUNTER — Inpatient Hospital Stay (HOSPITAL_COMMUNITY): Payer: BC Managed Care – PPO | Admitting: Speech Pathology

## 2012-05-21 ENCOUNTER — Inpatient Hospital Stay (HOSPITAL_COMMUNITY): Payer: BC Managed Care – PPO | Admitting: Occupational Therapy

## 2012-05-21 LAB — BASIC METABOLIC PANEL
CO2: 27 mEq/L (ref 19–32)
Chloride: 99 mEq/L (ref 96–112)
Creatinine, Ser: 0.83 mg/dL (ref 0.50–1.35)
GFR calc Af Amer: >90 mL/min (ref >90)
Potassium: 3.9 mEq/L (ref 3.5–5.1)
Sodium: 135 mEq/L (ref 135–145)

## 2012-05-21 LAB — GLUCOSE, CAPILLARY
Glucose-Capillary: 123 mg/dL — ABNORMAL HIGH (ref 70–99)
Glucose-Capillary: 102 mg/dL — ABNORMAL HIGH (ref 70–99)

## 2012-05-21 LAB — CBC
MCV: 87.5 fL (ref 78.0–100.0)
Platelets: 463 10*3/uL — ABNORMAL HIGH (ref 150–400)
RBC: 2.87 MIL/uL — ABNORMAL LOW (ref 4.22–5.81)
RDW: 16.2 % — ABNORMAL HIGH (ref 11.5–15.5)
WBC: 12.2 10*3/uL — ABNORMAL HIGH (ref 4.0–10.5)

## 2012-05-21 MED ORDER — VITAL 1.5 CAL PO LIQD
1000.0000 mL | ORAL | Status: DC
  Administered 2012-05-21 – 2012-05-23 (×2): 1000 mL
  Filled 2012-05-21 (×5): qty 1000

## 2012-05-21 MED ORDER — SODIUM CHLORIDE 0.9 % IV SOLN
250.0000 mg | Freq: Every day | INTRAVENOUS | Status: AC
  Administered 2012-05-21 – 2012-05-25 (×5): 250 mg via INTRAVENOUS
  Filled 2012-05-21 (×7): qty 5

## 2012-05-21 NOTE — Progress Notes (Signed)
Recreational Therapy Session Note  Patient Details  Name: Connor Small MRN: [id number] Date of Birth: 1960/09/12 Today's Date: 05/21/2012 Time:  7628-3151 Pain: no c/o Skilled Therapeutic Interventions/Progress Updates: Session focused on activity tolerance, standing tolerance, standing balance while standing in standing frame for 4 min, 5 min, & 6 min with facilitation for posture while playing blackjack card game.  Pt required  seated rest breaks in between stands.  Pt required mod cues for encouragement to participate/try activity.  Pt's wife present & observing.  Therapy/Group: Co-Treatment  Dillyn Menna 05/21/2012, 4:19 PM

## 2012-05-21 NOTE — Progress Notes (Signed)
Physical Therapy Session Note  Patient Details  Name: Connor Small MRN: [id number] Date of Birth: [birth year]  Today's Date: 05/21/2012 Time: 8719-5974 Time Calculation (min): 60 min  Short Term Goals: Week 2:  PT Short Term Goal 1 (Week 2): Pt will be able to perform bed mobility with mod A for supine to sit PT Short Term Goal 2 (Week 2): Pt will be able to scoot on edge of mat (using LE's to push through) with mod A to aid with slide board transfers PT Short Term Goal 3 (Week 2): Pt will be able to propel w/c with hemi technique x 50' with mod A  Skilled Therapeutic Interventions/Progress Updates:    Session focused on transfer training using slide board (max A from w/c to mat with pt able to direct set up), standing tolerance and postural re-education (facilitation to activate gluts and upright posture as pt tends to lean back against sling) using standing frame x 3 trials (4 min, 5 min, and 6 min) while engaging in card game using LUE mainly, but hand over hand assist to hold deck in R hand as dealer. Lateral leans and weightshifting edge of mat for positioning and removal of sling with steady A. Slide board transfer downhill back into the w/c with min A; pt resistant to trying, but able to facilitate scoot into the w/c with much encouragement by therapists and wife. Repositioned in w/c with mod A and discussed changing w/c to non-reclining w/c this week to promote more upright posture.   Therapy Documentation Precautions:  Precautions Precautions: Fall;Shoulder Type of Shoulder Precautions: right shoulder subluxation  Precaution Comments:  ostomy, tachycardia, Afib, Wound vac, peg tube (doesn't wear abd binder anymore) Required Braces or Orthoses: Other Brace/Splint Other Brace/Splint: right wrist splint Restrictions Weight Bearing Restrictions: No  Pain: C/o back pain throughout session in standing - rest breaks as needed. Premedicated.   See FIM for current functional  status  Therapy/Group: Individual Therapy and Co-Treatment with Recreational Therapy  [name] [facility] 05/21/2012, 4:22 PM

## 2012-05-21 NOTE — Consult Note (Addendum)
Ostomy follow-up: Wife is independently managing ostomy including pouch changes and emptying.  Denies need for assistance but having difficulty with pouch pulling away at edges during therapy.  Given pink tape and silicone tape samples to attempt to secure in a "window-pane fashion" around pouch edges instead of white adhesive tape which is hard on skin. Please re-consult if further assistance is needed.  Thank-you,  Julien Girt MSN, [place], [place], [place], [place]

## 2012-05-21 NOTE — Progress Notes (Signed)
Subjective/Complaints: Still with low grade temp.. A 12 point review of systems has been performed and if not noted above is otherwise negative.   Objective: Vital Signs: Blood pressure 107/83, pulse 120, temperature 98.6 F (37 C), temperature source Oral, resp. rate 18, height 5' 10"  (1.778 m), weight 67.8 kg (149 lb 7.6 oz), SpO2 97.00%. No results found.  Recent Labs  05/20/12 0614 05/21/12 0500  WBC 13.1* 12.2*  HGB 8.0* 8.3*  HCT 23.7* 25.1*  PLT 472* 463*   No results found for this basename: NA, K, CL, CO, GLUCOSE, BUN, CREATININE, CALCIUM,  in the last 72 hours CBG (last 3)   Recent Labs  05/20/12 1157 05/20/12 1654 05/20/12 2043  GLUCAP 101* 113* 111*    Wt Readings from Last 3 Encounters:  05/21/12 67.8 kg (149 lb 7.6 oz)  05/07/12 66.2 kg (145 lb 15.1 oz)  04/18/12 71.94 kg (158 lb 9.6 oz)    Physical Exam:  Constitutional: He appears well-developed. He has a sickly appearance.   HENT:  Head: Normocephalic and atraumatic.  Eyes: Pupils are equal, round, and reactive to light.  Neck: Normal range of motion.  Cardiovascular: Regular rhythm. Tachycardia present.  HR 100's during the exam.  Pulmonary/Chest: Effort normal. No respiratory distress.  He has no wheezes. Decreased sounds at the bases. Abdominal: He exhibits minimal distension. Bowel sounds are increased. There is tenderness.  Midline incision with retention sutures in place--dehisced incision with pink, clean wound bed.  . ---continuing to close nicely--central area still with some depth Neurological: He is alert.   Basic insight and awareness. Was able to follow basic commands. Hoarse voice. 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 UE grossly  2to 3/5 LUE, 1-2/5 RUE with contracture in Hand (limited flexion). LE 1-2/5with weakness moreso in ankle dorsiflexion. DTR's 1+. Diffuse muscle wasting.  Skin: Skin is warm and dry.  Psychiatric: relaxed  Assessment/Plan:  1. Functional deficits  secondary to severe deconditioning, critical illness neuropathy/myopathy after multiple medical which require 3+ hours per day of interdisciplinary therapy in a comprehensive inpatient rehab setting. Physiatrist is providing close team supervision and 24 hour management of active medical problems listed below. Physiatrist and rehab team continue to assess barriers to discharge/monitor patient progress toward functional and medical goals.  FIM: FIM - Bathing Bathing Steps Patient Completed: Chest;Right Arm;Abdomen;Front perineal area;Right upper leg;Left upper leg Bathing: 3: Mod-Patient completes 5-7 84f10 parts or 50-74% (supine for LB in w/c for upper body)  FIM - Upper Body Dressing/Undressing Upper body dressing/undressing steps patient completed: Thread/unthread right sleeve of pullover shirt/dresss;Thread/unthread left sleeve of pullover shirt/dress Upper body dressing/undressing: 3: Mod-Patient completed 50-74% of tasks FIM - Lower Body Dressing/Undressing Lower body dressing/undressing: 2: Max-Patient completed 25-49% of tasks  FIM - Toileting Toileting: 6: Assistive device: No helper  FIM - TAir cabin crewTransfers: 0-Activity did not occur  FIM - BControl and instrumentation engineerDevices: Sliding board;Arm rests Bed/Chair Transfer: 2: Bed > Chair or W/C: Max A (lift and lower assist)  FIM - Locomotion: Wheelchair Locomotion: Wheelchair: 1: Travels less than 50 ft with moderate assistance (Pt: 50 - 74%) FIM - Locomotion: Ambulation Ambulation/Gait Assistance: Not tested (comment) Locomotion: Ambulation: 0: Activity did not occur  Comprehension Comprehension Mode: Auditory Comprehension: 6-Follows complex conversation/direction: With extra time/assistive device  Expression Expression Mode: Verbal Expression: 5-Expresses basic 90% of the time/requires cueing < 10% of the time.  Social Interaction Social Interaction Mode: Not assessed Social  Interaction: 4-Interacts appropriately 75 - 89%  of the time - Needs redirection for appropriate language or to initiate interaction.  Problem Solving Problem Solving Mode: Not assessed Problem Solving: 5-Solves basic 90% of the time/requires cueing < 10% of the time  Memory Memory Mode: Not assessed Memory: 5-Recognizes or recalls 90% of the time/requires cueing < 10% of the time  Medical Problem List and Plan:  1. DVT Prophylaxis/Anticoagulation: Mechanical: Sequential compression devices, below knee Bilateral lower extremities thrombocytopenia resolved.   2. Multifactorial pain: tylenol, local measures, use ultram for more severe pain, HA 3. Mood: neuropsych consult requested. Needs encouragement  -klonopin to continue 4. Neuropsych: This patient is not capable of making decisions on his/her own behalf.  5. FEN::   Serial labs, adjusting fluids, formula as well. Potassium slightly elevated--recheck Monday  -change to boluses at some point depending upon PO intake 6. Bilateral cardioembolic CVA: no blood thinners due to issues with bleeding 7. ABLA:  Aranesp, feso4--  -recheck cbc today 8.3   -no signs of overt blood loss  -continue observation and serial labs 8. 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 Protect heels, stretch heel cords  -AFO's at some point? 9. Low grade temp- urine culture negative, cxr with increased effusion, infiltrate  -continue levaquin  -follow up cxr tomorrow  -IS  -?small thrombus right calf on doppler---doubt this would contribute to temp  -consider ID consult  LOS (Days) 12 A FACE TO FACE EVALUATION WAS PERFORMED  Connor Small T 05/21/2012 7:30 AM

## 2012-05-21 NOTE — Progress Notes (Addendum)
NUTRITION FOLLOW UP  DOCUMENTATION CODES  Per approved criteria   -Severe malnutrition in the context of acute illness or injury    Intervention:    Resume continuous feedings of Vital 1.5 at 80 ml/hr x 18 hours while pt has ongoing n/v. This regimen will provide 2160 kcal, 97 grams protein, 1700 ml free water.   Starting tomorrow:  If pt consumes <50% of meal, initiate 80 mL bolus Vital 1.5 administered over 30 minutes at 0800, 1300, 1800. Nocturnal continuous feedings of Vital 1.5 at 75 ml/hr x 2000 - 0600.  Once pt tolerates this, on next day provide: 160 mL bolus Vital 1.5 administered over 30 minutes at 0800, 1300, 1800. Pt to receive bolus if meal intake <50%. Nocturnal continuous feedings of Vital 1.5 at 75 ml/hr x 2000 - 0600.  Once pt tolerates this, on next day provide: 237 mL bolus of Vital 1.5 administered over 30 minutes at 0800, 1300, 1800. Pt to receive bolus if meal intake <50%. Nocturnal continuous feedings of Vital 1.5 at 75 ml/hr x 2000 - 0600. Goal regimen will provide: 2190 kcal, 99 grams protein, 1116 ml free water. 2. RD to continue to follow nutrition care plan.  Nutrition Dx:   Inadequate oral intake related to poor appetite, early satiety, and lethargy as evidenced by poor meal completion. Ongoing.  Goal:   Intake to meet >90% of estimated nutrition needs. Met with enteral nutrition.  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 Pt with acute respiratory and renal failure with ARDS and septic shock. Pt recently discharged to CIR, however currently admitted as inpatient due to possible aspiration PNA (4/2).   Hospital course nutrition summary:  2/2 - 2/20: permissively underfed with EN   2/21-2/26: advanced to full feeds  2/26 - 3/3: feeds held for surgery  3/4 - 3/11: resumed full feeds  3/11 - 3/12: feeds held for GI bleed  3/13 - 3/24: received full feeds  3/24: TFs discontinued diet initiated; pt with very poor oral intake; tx to rehab 3/25  3/28 - 4/1: increased n/v - pt refusing almost all meals and supplements; wife declined calorie count, requesting TPN  4/2: NGT placed to suction, surgery ordering TF initiation; tx back to acute with plans for trickle feedings.  4/3-4/4: Trickle feeds of Vital 1.2 @ 30 mL/hr  4/5: PEG placement, resume of Vital 1.2  4/6-4/9: Inconsistent TF infusion and frequent adjustment of rate. Pt has not yet reached >40 mL/hr with tolerance. Wt now 151 lbs.  4/10-11: Pt has advanced as scheduled to 60 mL/hr. Discussed RN. Plan to increase to 70 mL this afternoon.  4/14: Pt has reached goal TF rate of 75 mL/hr with tolerance. PO intake remains insufficient with improvement per pt's report.  4/15: Pt ready to begin transition to cyclic feeds. RD to order Vital 1.2 @ 85 mL/hr x21 hrs daily. Will continue with daily advancements as tolerated and shorter duration of tubefeeds.  4/16: Pt agreeable to change formula to Vital 1.5 @ 80 mL/hr x 18 hrs daily.  4/17: Transfer to CIR. Continues with order of Vital 1.5 @ 80 ml/hr x 18 hrs daily. 4/21: Initiation of 3-day calorie count; continue Vital 1.5 @ 80 ml/hr x 18 hrs daily. 4/22: Day  1 Calorie Count Results -824 kcal (37% of minimum estimated needs) and 13.5 protein (11% of minimum estimated needs) 4/25:  Pt continues cyclic regimen.  Calorie count complete.  Wife and patient informed of results. ecreased cyclic feeds overnight and start transition to bolus feeds. Changes in regimen based on calorie count results are listed in interventions above. 4/25: Pt tolerated 80 mL bolus Vital 1.5 administered over 30 minutes at 6p.  Resume Vital 1.5 @ 80 mL/hr x 15 hrs from 7p-10a. 4/27 - 4/28: Pt with low grade  fevers and n/v. Did not attempt boluses over weekend per wife.  Pt's greatest barrier to tolerance is nausea especially at high rates.  Discussed using meal completion as benchmark- such as 1 can of Vital 1.5/meal if </=50% of meal consumed, wife is now agreeable.  RD discussed volumes pt is currently taking PO and will make incremental advancements over the weekend to goal of 1 can TID.  Will initiate bolus of 80 mL Vital 1.5 @ 0800 tomorrow after breakfast.  Working towards d/c date of 5/14. Fevers and general malaise last night and this AM. Most recent temperature is 98.6 degrees F. CXR (4/23) revealed some increase in R pleural effusion, large L pleural effusion unchanged. Bilateral airspace disease persists.  Discussed wife's therapy scheduling requests with [facility] Glad.  Height: Ht Readings from Last 1 Encounters:  05/09/12 5' 10"  (1.778 m)    Weight Status:   Wt Readings from Last 1 Encounters:  05/21/12 149 lb 7.6 oz (67.8 kg)  Wt variable  Re-estimated needs:  Kcal: 2200 - 2400 Protein: 120 - 140 grams Fluid: > 2.2 liters  Skin: incisions, ileostomy, stage II pressure ulcer to R buttocks  Diet Order: General   Intake/Output Summary (Last 24 hours) at 05/21/12 0913 Last data filed at 05/21/12 0030  Gross per 24 hour  Intake    120 ml  Output    425 ml  Net   -305 ml    Last BM: 4/27 (275 ml output today via ostomy yesterday)  Labs:   Recent Labs Lab 05/21/12 0500  NA 135  K 3.9  CL 99  CO2 27  BUN 18  CREATININE 0.83  CALCIUM 8.8  GLUCOSE 128*    CBG (last 3)   Recent Labs  05/20/12 1654 05/20/12 2043 05/21/12 0718  GLUCAP 113* 111* 123*    Scheduled Meds: . aspirin  81 mg Per Tube Daily  . chlorhexidine  15 mL Mouth Rinse BID  . clonazePAM  0.5 mg Oral QHS  . darbepoetin (ARANESP) injection - NON-DIALYSIS  40 mcg Subcutaneous Q Tue-1800  . ferrous sulfate  300 mg Per Tube TID WC  . free water  200 mL Per Tube Q3H  . insulin aspart  0-10 Units  Subcutaneous TID WC  . insulin aspart  0-5 Units Subcutaneous QHS  . levofloxacin  750 mg Oral q1800  . lidocaine  1 patch Transdermal Q24H  . lip balm   Topical BID  . loperamide  2 mg Per Tube QHS  . loperamide  2-4 mg Per Tube UD  . metoCLOPramide  10 mg Oral TID AC  . metoprolol tartrate  6.25 mg Per Tube Daily  . multivitamin  5 mL Per Tube Daily  . pantoprazole sodium  40 mg Per Tube Q1200  . saccharomyces boulardii  250 mg Oral BID    Continuous Infusions: . feeding supplement (VITAL 1.5 CAL) 1,000 mL (05/20/12 1926)    Inda Coke MS, RD, LDN  Pager: [phone] After-hours pager: [phone]

## 2012-05-21 NOTE — Progress Notes (Signed)
Occupational Therapy Session Note  Patient Details  Name: Connor Small MRN: [id number] Date of Birth: Nov 29, 1960  Today's Date: 05/21/2012 Time: 1030-1130 Time Calculation (min): 60 min  Short Term Goals: Week 1:  OT Short Term Goal 1 (Week 1): Self Feeding:  Patient will feed himself at least 50% of 2 meals per day during this recording period. OT Short Term Goal 1 - Progress (Week 1): Progressing toward goal OT Short Term Goal 2 (Week 1): Grooming:  Patient will perform 2 grooming tasks with min assist to include set up once items needed are provided OT Short Term Goal 2 - Progress (Week 1): Progressing toward goal OT Short Term Goal 3 (Week 1): UB Dressing:  Patient will don shirt in supported sit with max assist OT Short Term Goal 3 - Progress (Week 1): Met OT Short Term Goal 4 (Week 1): Sitting balance:  Patient will sit without back support with mod assist while completing simple BADL task at least 2 days this recording period. OT Short Term Goal 4 - Progress (Week 1): Met OT Short Term Goal 5 (Week 1): UE exercises:  Patient will tolerate BUE exercises at least 4 times during this recording period. OT Short Term Goal 5 - Progress (Week 1): Met  Skilled Therapeutic Interventions/Progress Updates:  No complaints of pain, except during movements. Patient found supine in bed. Therapist and wife engaged patient in AAROM/stretching exercises -> BUE and BLEs. Patient anxious to get OOB. After stretching, patient engaged in bed mobility and sat edge of bed for UB/LB dressing using reacher to increase independence with LB dressing. After dressing patient's wife assisted patient with slide board transfer from edge of bed -> w/c; patient directing care appropriately and prn. Therapist assisted patient with positioning in w/c. At end of session, left patient seated in w/c with wife present in room.   Precautions:  Precautions Precautions: Fall;Shoulder Type of Shoulder Precautions: right  shoulder subluxation  Precaution Comments:  ostomy, tachycardia, Afib, Wound vac, peg tube (doesn't wear abd binder anymore) Required Braces or Orthoses: Other Brace/Splint Other Brace/Splint: right wrist splint Restrictions Weight Bearing Restrictions: No  See FIM for current functional status  Therapy/Group: Individual Therapy  Josphine Laffey 05/21/2012, 12:02 PM

## 2012-05-21 NOTE — Progress Notes (Signed)
Speech Language Pathology Daily Session Note  Patient Details  Name: Connor Small MRN: [id number] Date of Birth: May 10, 1960  Today's Date: 05/21/2012 Time: 0800-0825 Time Calculation (min): 25 min  Short Term Goals: Week 2: SLP Short Term Goal 1 (Week 2): Pt will perform vocal function exercises with Mod I.  SLP Short Term Goal 2 (Week 2): Pt will demonstrate functional problem solving with basic and familiar tasks with supervision verbal cues.  SLP Short Term Goal 3 (Week 2): Pt will utilize external memory aids to recall new, daily information with supervision verbal cues.  SLP Short Term Goal 4 (Week 2): pt will demonstrate selective attention to a functional task for 30 minutes with min verbal cues.  SLP Short Term Goal 5 (Week 2): Pt will initiate functional tasks with supervision verbal and question cues.   Skilled Therapeutic Interventions: Treatment focus on cognitive goals. SLP facilitated session by providing supervision question cues for recall of new information in regards to directing his care. Pt also utilized an increased vocal intensity with supervision verbal cues with increased social interaction with clinician. Discussed current progress and SLP goals with pt and his wife and both are in agreement to change plan of care for skilled SLP intervention to 3X/week.    FIM:  Comprehension Comprehension Mode: Auditory Comprehension: 6-Follows complex conversation/direction: With extra time/assistive device Expression Expression Mode: Verbal Expression: 5-Expresses basic needs/ideas: With extra time/assistive device Social Interaction Social Interaction: 4-Interacts appropriately 75 - 89% of the time - Needs redirection for appropriate language or to initiate interaction. Problem Solving Problem Solving: 5-Solves basic 90% of the time/requires cueing < 10% of the time Memory Memory: 5-Requires cues to use assistive device  Pain Pain Assessment Pain Assessment: No/denies  pain  Therapy/Group: Individual Therapy  Jacolyn Joaquin, [place] 05/21/2012, 11:18 AM

## 2012-05-22 ENCOUNTER — Inpatient Hospital Stay (HOSPITAL_COMMUNITY): Payer: BC Managed Care – PPO | Admitting: Occupational Therapy

## 2012-05-22 ENCOUNTER — Inpatient Hospital Stay (HOSPITAL_COMMUNITY): Payer: BC Managed Care – PPO | Admitting: Physical Therapy

## 2012-05-22 ENCOUNTER — Inpatient Hospital Stay (HOSPITAL_COMMUNITY): Payer: BC Managed Care – PPO

## 2012-05-22 ENCOUNTER — Inpatient Hospital Stay (HOSPITAL_COMMUNITY): Payer: BC Managed Care – PPO | Admitting: Speech Pathology

## 2012-05-22 ENCOUNTER — Inpatient Hospital Stay (HOSPITAL_COMMUNITY): Payer: BC Managed Care – PPO | Admitting: *Deleted

## 2012-05-22 DIAGNOSIS — G8250 Quadriplegia, unspecified: Secondary | ICD-10-CM

## 2012-05-22 DIAGNOSIS — G7281 Critical illness myopathy: Secondary | ICD-10-CM

## 2012-05-22 LAB — GLUCOSE, CAPILLARY: Glucose-Capillary: 122 mg/dL — ABNORMAL HIGH (ref 70–99)

## 2012-05-22 NOTE — Progress Notes (Signed)
Recreational Therapy Session Note  Patient Details  Name: Connor Small MRN: [id number] Date of Birth: [birth year] Today's Date: 05/22/2012 Time:  1130-12 Pain: c/o back pain in standing, premedicated Skilled Therapeutic Interventions/Progress Updates: Pt stood in standing frame ~4 min & then 5 min with mod cues for encouragement.  Pt's wife inquiring about "field trip" referring to an outing.  Educated pt & family on community reintegration/outing and it's purpose including functional mobility.  They stated understanding.  Will address this request as team feels pt is appropriate for this intervention.  Therapy/Group: Co-Treatment  Shariyah Eland 05/22/2012, 4:35 PM

## 2012-05-22 NOTE — Progress Notes (Signed)
Occupational Therapy Session Note  Patient Details  Name: Connor Small MRN: [id number] Date of Birth: 1960-03-04  Today's Date: 05/22/2012 Time: 1000-1055 Time Calculation (min): 55 min  Short Term Goals: Week 1:  OT Short Term Goal 1 (Week 1): Self Feeding:  Patient will feed himself at least 50% of 2 meals per day during this recording period. OT Short Term Goal 1 - Progress (Week 1): Progressing toward goal OT Short Term Goal 2 (Week 1): Grooming:  Patient will perform 2 grooming tasks with min assist to include set up once items needed are provided OT Short Term Goal 2 - Progress (Week 1): Progressing toward goal OT Short Term Goal 3 (Week 1): UB Dressing:  Patient will don shirt in supported sit with max assist OT Short Term Goal 3 - Progress (Week 1): Met OT Short Term Goal 4 (Week 1): Sitting balance:  Patient will sit without back support with mod assist while completing simple BADL task at least 2 days this recording period. OT Short Term Goal 4 - Progress (Week 1): Met OT Short Term Goal 5 (Week 1): UE exercises:  Patient will tolerate BUE exercises at least 4 times during this recording period. OT Short Term Goal 5 - Progress (Week 1): Met  Week 2:  OT Short Term Goal 1 (Week 2): Patient will perform grooming tasks with minimal assistance OT Short Term Goal 2 (Week 2): Patient will perform LB dressing with moderate assistance (pants only) OT Short Term Goal 3 (Week 2): Patient will be able to direct care with slide board transfer with min verbal cues OT Short Term Goal 4 (Week 2): Patient will be able to direct UE exercises with min verbal cues  Skilled Therapeutic Interventions/Progress Updates:  Patient found supine in bed. Engaged in [place] AAROM/stretching exercises and BLE AAROM/stretching exercises. Encouraged wife to perform AAROM/stretching exercises -> RUE more than once a day. Patient then engaged in bed mobility with wife assisting prn. Patient then sat edge of bed for  UB/LB dressing. Therapist had patient stand with SARA PLUS in order to pull pants up to waist. Therapist then transferred patient into w/c using SARA PLUS. At end of session, left patient seated in w/c. Wife present in room throughout session.   Precautions:  Precautions Precautions: Fall;Shoulder Type of Shoulder Precautions: right shoulder subluxation  Precaution Comments:  ostomy, tachycardia, Afib, Wound vac, peg tube (doesn't wear abd binder anymore) Required Braces or Orthoses: Other Brace/Splint Other Brace/Splint: right wrist splint Restrictions Weight Bearing Restrictions: No  See FIM for current functional status  Therapy/Group: Individual Therapy  Kitrina Maurin 05/22/2012, 12:04 PM

## 2012-05-22 NOTE — Progress Notes (Signed)
Physical Therapy Session Note  Patient Details  Name: Connor Small MRN: [id number] Date of Birth: 26-Sep-1960  Today's Date: 05/22/2012 Time: 1130-1200 Time Calculation (min): 30 min  Short Term Goals: Week 2:  PT Short Term Goal 1 (Week 2): Pt will be able to perform bed mobility with mod A for supine to sit PT Short Term Goal 2 (Week 2): Pt will be able to scoot on edge of mat (using LE's to push through) with mod A to aid with slide board transfers PT Short Term Goal 3 (Week 2): Pt will be able to propel w/c with hemi technique x 50' with mod A  Skilled Therapeutic Interventions/Progress Updates:    Session focused on neuro re-ed for postural control, weightbearing through LE's, and overall standing tolerance in standing frame 4 min and then x 5 min. Attempted w/c propulsion using LE's only forward and backwards and recommended to wife and pt to practice this as May. Plan to change out w/c to non reclining w/c today or tomorrow.   Therapy Documentation Precautions:  Precautions Precautions: Fall;Shoulder Type of Shoulder Precautions: right shoulder subluxation  Precaution Comments:  ostomy, tachycardia, Afib, Wound vac, peg tube (doesn't wear abd binder anymore) Required Braces or Orthoses: Other Brace/Splint Other Brace/Splint: right wrist splint Restrictions Weight Bearing Restrictions: No  Pain:  Back pain in standing - premedicated.   See FIM for current functional status  Therapy/Group: Individual Therapy and Co-Treatment with Recreational Therapy  [name] [facility] 05/22/2012, 12:29 PM

## 2012-05-22 NOTE — Progress Notes (Signed)
NUTRITION FOLLOW UP  DOCUMENTATION CODES  Per approved criteria   -Severe malnutrition in the context of acute illness or injury    Intervention:    Continue the following enteral nutrition regimen:  If pt consumes <50% of meal, initiate 80 mL bolus Vital 1.5 administered over 30 minutes at 0800, 1300, 1800. Nocturnal continuous feedings of Vital 1.5 at 75 ml/hr x 2000 - 0600.  Once pt tolerates this, on next day provide: 160 mL bolus Vital 1.5 administered over 30 minutes at 0800, 1300, 1800. Pt to receive bolus if meal intake <50%. Nocturnal continuous feedings of Vital 1.5 at 75 ml/hr x 2000 - 0600.  Once pt tolerates this, on next day provide: 237 mL bolus of Vital 1.5 administered over 30 minutes at 0800, 1300, 1800. Pt to receive bolus if meal intake <50%. Nocturnal continuous feedings of Vital 1.5 at 75 ml/hr x 2000 - 0600. Goal regimen will provide: 2190 kcal, 99 grams protein, 1116 ml free water. 2. RD to continue to follow nutrition care plan.  Nutrition Dx:   Inadequate oral intake related to poor appetite, early satiety, and lethargy as evidenced by poor meal completion. Ongoing.  Goal:   Intake to meet >90% of estimated nutrition needs. Met with enteral nutrition.  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 Pt with acute respiratory and renal failure with ARDS and septic shock. Pt recently discharged to CIR, however currently admitted as inpatient due to possible aspiration PNA (4/2).   Hospital course nutrition summary:  2/2 - 2/20: permissively underfed with EN  2/21-2/26: advanced to full feeds  2/26 - 3/3: feeds held for surgery  3/4 - 3/11: resumed full feeds  3/11 - 3/12: feeds held for GI bleed   3/13 - 3/24: received full feeds  3/24: TFs discontinued diet initiated; pt with very poor oral intake; tx to rehab 3/25  3/28 - 4/1: increased n/v - pt refusing almost all meals and supplements; wife declined calorie count, requesting TPN  4/2: NGT placed to suction, surgery ordering TF initiation; tx back to acute with plans for trickle feedings.  4/3-4/4: Trickle feeds of Vital 1.2 @ 30 mL/hr  4/5: PEG placement, resume of Vital 1.2  4/6-4/9: Inconsistent TF infusion and frequent adjustment of rate. Pt has not yet reached >40 mL/hr with tolerance. Wt now 151 lbs.  4/10-11: Pt has advanced as scheduled to 60 mL/hr. Discussed RN. Plan to increase to 70 mL this afternoon.  4/14: Pt has reached goal TF rate of 75 mL/hr with tolerance. PO intake remains insufficient with improvement per pt's report.  4/15: Pt ready to begin transition to cyclic feeds. RD to order Vital 1.2 @ 85 mL/hr x21 hrs daily. Will continue with daily advancements as tolerated and shorter duration of tubefeeds.  4/16: Pt agreeable to change formula to Vital 1.5 @ 80 mL/hr x 18 hrs daily.  4/17: Transfer to CIR. Continues with order of Vital 1.5 @ 80 ml/hr x 18 hrs daily. 4/21: Initiation of 3-day calorie count; continue Vital 1.5 @ 80 ml/hr x 18 hrs daily. 4/22: Day 1 Calorie Count Results -824 kcal (37% of minimum estimated needs) and 13.5 protein (11% of minimum estimated needs) 4/25:  Pt continues cyclic regimen.  Calorie count  complete.  Wife and patient informed of results. ecreased cyclic feeds overnight and start transition to bolus feeds. Changes in regimen based on calorie count results are listed in interventions above. 4/25: Pt tolerated 80 mL bolus Vital 1.5 administered over 30 minutes at 6p.  Resume Vital 1.5 @ 80 mL/hr x 15 hrs from 7p-10a. 4/27 - 4/28: Pt with low grade fevers and n/v. Did not attempt boluses over weekend per wife.  Pt did not receive bolus today, per wife pt did not have time to receive bolus  this morning, as pt had to eat a quick breakfast prior to getting a chest X-ray. Per wife, plan to initiate bolus of 80 mL Vital 1.5 today after lunch.   Working towards d/c date of 5/14. Most recent temperature is 98.9 degrees F. CXR this morning revealed enlarged left pleural effusion, possible left lung edema or infiltrates. Clear right lung.  RD reviewed current enteral nutrition orders with RN on duty - RN denies any questions/concerns at this time.  Height: Ht Readings from Last 1 Encounters:  05/09/12 5' 10"  (1.778 m)    Weight Status:   Wt Readings from Last 1 Encounters:  05/22/12 143 lb 15.4 oz (65.3 kg)  Wt variable  Re-estimated needs:  Kcal: 2200 - 2400 Protein: 120 - 140 grams Fluid: > 2.2 liters  Skin: incisions, ileostomy, stage II pressure ulcer to R buttocks  Diet Order: General   Intake/Output Summary (Last 24 hours) at 05/22/12 1228 Last data filed at 05/22/12 0836  Gross per 24 hour  Intake   1560 ml  Output   1350 ml  Net    210 ml    Last BM: 4/27 (275 ml output today via ostomy yesterday)  Labs:   Recent Labs Lab 05/21/12 0500  NA 135  K 3.9  CL 99  CO2 27  BUN 18  CREATININE 0.83  CALCIUM 8.8  GLUCOSE 128*    CBG (last 3)   Recent Labs  05/21/12 2114 05/22/12 0706 05/22/12 1130  GLUCAP 127* 122* 98    Scheduled Meds: . aspirin  81 mg Per Tube Daily  . chlorhexidine  15 mL Mouth Rinse BID  . clonazePAM  0.5 mg Oral QHS  . darbepoetin (ARANESP) injection - NON-DIALYSIS  40 mcg Subcutaneous Q Tue-1800  . ferrous sulfate  300 mg Per Tube TID WC  . free water  200 mL Per Tube Q3H  . insulin aspart  0-10 Units Subcutaneous TID WC  . insulin aspart  0-5 Units Subcutaneous QHS  . iron dextran (INFED/DEXFERRUM) infusion  250 mg Intravenous Daily  . levofloxacin  750 mg Oral q1800  . lidocaine  1 patch Transdermal Q24H  . lip balm   Topical BID  . loperamide  2 mg Per Tube QHS  . loperamide  2-4 mg Per Tube UD  .  metoCLOPramide  10 mg Oral TID AC  . metoprolol tartrate  6.25 mg Per Tube Daily  . multivitamin  5 mL Per Tube Daily  . pantoprazole sodium  40 mg Per Tube Q1200  . saccharomyces boulardii  250 mg Oral BID    Continuous Infusions: . feeding supplement (VITAL 1.5 CAL) 1,000 mL (05/21/12 1821)    Inda Coke MS, RD, LDN Pager: [phone] After-hours pager: [phone]

## 2012-05-22 NOTE — Progress Notes (Signed)
Subjective/Complaints: Still with low grade temp. Minimal cough. Slept fairly well. A 12 point review of systems has been performed and if not noted above is otherwise negative.   Objective: Vital Signs: Blood pressure 117/67, pulse 115, temperature 98.9 F (37.2 C), temperature source Oral, resp. rate 18, height 5' 10"  (1.778 m), weight 65.3 kg (143 lb 15.4 oz), SpO2 95.00%. No results found.  Recent Labs  05/20/12 0614 05/21/12 0500  WBC 13.1* 12.2*  HGB 8.0* 8.3*  HCT 23.7* 25.1*  PLT 472* 463*    Recent Labs  05/21/12 0500  NA 135  K 3.9  CL 99  GLUCOSE 128*  BUN 18  CREATININE 0.83  CALCIUM 8.8   CBG (last 3)   Recent Labs  05/21/12 1632 05/21/12 2114 05/22/12 0706  GLUCAP 102* 127* 122*    Wt Readings from Last 3 Encounters:  05/22/12 65.3 kg (143 lb 15.4 oz)  05/07/12 66.2 kg (145 lb 15.1 oz)  04/18/12 71.94 kg (158 lb 9.6 oz)    Physical Exam:  Constitutional: He appears well-developed. He has a sickly appearance.   HENT:  Head: Normocephalic and atraumatic.  Eyes: Pupils are equal, round, and reactive to light.  Neck: Normal range of motion.  Cardiovascular: Regular rhythm. Tachycardia present.  HR 90-100's during the exam.  Pulmonary/Chest: Effort normal. No respiratory distress.  He has no wheezes. Decreased sounds at the bases. Abdominal: He exhibits minimal distension. Bowel sounds are increased. There is tenderness.  Midline incision with retention sutures in place--dehisced incision with pink, clean wound bed.  . ---continuing to close nicely--central area still with some depth Neurological: He is alert.   Basic insight and awareness. Was able to follow basic commands. Hoarse voice. 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 UE grossly  2to 3/5 LUE, 1-2/5 RUE with contracture in Hand (limited flexion). LE 1-2/5with weakness moreso in ankle dorsiflexion. DTR's 1+. Diffuse muscle wasting.  Skin: Skin is warm and dry.  Psychiatric:  relaxed  Assessment/Plan:  1. 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 Physiatrist is providing close team supervision and 24 hour management of active medical problems listed below. Physiatrist and rehab team continue to assess barriers to discharge/monitor patient progress toward functional and medical goals.  FIM: FIM - Bathing Bathing Steps Patient Completed: Chest;Right Arm;Abdomen;Front perineal area;Right upper leg;Left upper leg Bathing: 3: Mod-Patient completes 5-7 70f10 parts or 50-74% (supine for LB in w/c for upper body)  FIM - Upper Body Dressing/Undressing Upper body dressing/undressing steps patient completed: Thread/unthread right sleeve of pullover shirt/dresss;Thread/unthread left sleeve of pullover shirt/dress Upper body dressing/undressing: 3: Mod-Patient completed 50-74% of tasks FIM - Lower Body Dressing/Undressing Lower body dressing/undressing: 2: Max-Patient completed 25-49% of tasks  FIM - Toileting Toileting: 6: Assistive device: No helper  FIM - TAir cabin crewTransfers: 0-Activity did not occur  FIM - BControl and instrumentation engineerDevices: Sliding board;Arm rests Bed/Chair Transfer: 2: Bed > Chair or W/C: Max A (lift and lower assist)  FIM - Locomotion: Wheelchair Locomotion: Wheelchair: 1: Total Assistance/staff pushes wheelchair (Pt<25%) FIM - Locomotion: Ambulation Ambulation/Gait Assistance: Not tested (comment) Locomotion: Ambulation: 0: Activity did not occur  Comprehension Comprehension Mode: Auditory Comprehension: 6-Follows complex conversation/direction: With extra time/assistive device  Expression Expression Mode: Verbal Expression: 5-Expresses basic needs/ideas: With extra time/assistive device  Social Interaction Social  Interaction Mode: Not assessed Social Interaction: 4-Interacts appropriately 75 -  89% of the time - Needs redirection for appropriate language or to initiate interaction.  Problem Solving Problem Solving Mode: Not assessed Problem Solving: 5-Solves basic 90% of the time/requires cueing < 10% of the time  Memory Memory Mode: Not assessed Memory: 5-Requires cues to use assistive device  Medical Problem List and Plan:  1. DVT Prophylaxis/Anticoagulation: Mechanical: Sequential compression devices, below knee Bilateral lower extremities thrombocytopenia resolved.   2. Multifactorial pain: tylenol, local measures, use ultram for more severe pain, HA 3. Mood: neuropsych consult requested. Needs encouragement  -klonopin to continue for now 4. Neuropsych: This patient is not capable of making decisions on his/her own behalf.  5. FEN::   Serial labs, adjusting fluids, formula as well. Potassium slightly elevated--recheck Monday  -change to boluses at some point depending upon PO intake 6. Bilateral cardioembolic CVA: no blood thinners due to issues with bleeding 7. ABLA:  Aranesp, feso4--may continue aranesp another  -serial cbc's  -no signs of overt blood loss  -continue observation and serial labs 8. 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 9. Low grade temp- urine culture negative, cxr with increased effusion, infiltrate  -continue levaquin  -follow up cxr today  -IS  -?small thrombus right calf on doppler---doubt this would contribute to temp  -consider ID consult  LOS (Days) 13 A FACE TO FACE EVALUATION WAS PERFORMED  Elisabetta Mishra T 05/22/2012 7:42 AM

## 2012-05-22 NOTE — Progress Notes (Signed)
Speech Language Pathology Daily Session Note  Patient Details  Name: Connor Small MRN: [id number] Date of Birth: 1960-03-23  Today's Date: 05/22/2012 Time: 0459-9774 Time Calculation (min): 30 min  Short Term Goals: Week 2: SLP Short Term Goal 1 (Week 2): Pt will perform vocal function exercises with Mod I.  SLP Short Term Goal 2 (Week 2): Pt will demonstrate functional problem solving with basic and familiar tasks with supervision verbal cues.  SLP Short Term Goal 3 (Week 2): Pt will utilize external memory aids to recall new, daily information with supervision verbal cues.  SLP Short Term Goal 4 (Week 2): pt will demonstrate selective attention to a functional task for 30 minutes with min verbal cues.  SLP Short Term Goal 5 (Week 2): Pt will initiate functional tasks with supervision verbal and question cues.   Skilled Therapeutic Interventions: Treatment focus on cognitive goals. Pt recalled rules of a previously learned task with supervision question cues and  demonstrated organization and problem solving with mildly complex task with supervision question and semantic cues.    FIM:  Comprehension Comprehension Mode: Auditory Comprehension: 6-Follows complex conversation/direction: With extra time/assistive device Expression Expression Mode: Verbal Expression: 5-Expresses complex 90% of the time/cues < 10% of the time Social Interaction Social Interaction: 5-Interacts appropriately 90% of the time - Needs monitoring or encouragement for participation or interaction. Problem Solving Problem Solving: 5-Solves complex 90% of the time/cues < 10% of the time Memory Memory: 5-Requires cues to use assistive device  Pain Pain Assessment Pain Assessment: No/denies pain  Therapy/Group: Individual Therapy  Yvonne Petite 05/22/2012, 3:31 PM

## 2012-05-22 NOTE — Progress Notes (Signed)
Physical Therapy Session Note  Patient Details  Name: Connor Small MRN: [id number] Date of Birth: August 06, 1960  Today's Date: 05/22/2012 Time: 9628-3662 Time Calculation (min): 35 min  Short Term Goals: Week 2:  PT Short Term Goal 1 (Week 2): Pt will be able to perform bed mobility with mod A for supine to sit PT Short Term Goal 2 (Week 2): Pt will be able to scoot on edge of mat (using LE's to push through) with mod A to aid with slide board transfers PT Short Term Goal 3 (Week 2): Pt will be able to propel w/c with hemi technique x 50' with mod A  Skilled Therapeutic Interventions/Progress Updates:    This session focused on getting pt into a less supportive, better fitting WC.  Pressure relief cushion and 18x18 mid thoracic sling back WC brought in to replace the reclining higher back, larger WC in his room currently.  Used [name] plus to transfer out of old WC and try new WC.  Pt seemed satisfied, still wanted towel rolls (looks like they are using them as lateral supports, but he says they are to pad his elbows.  Stood x 4 with sara plus <1 min each time.  HR max 145 bpm, O2 sats 98% on RA.  Pt became more anxious with each stand.  Worked on pursed lip breathing and visualizations during rest breaks to try to bring HR and anxiety down.  Wife reports he is on medication once daily to help with tachycardia.    Therapy Documentation Precautions:  Precautions Precautions: Fall;Shoulder Type of Shoulder Precautions: right shoulder subluxation  Precaution Comments:  ostomy, tachycardia, Afib, Wound vac, peg tube (doesn't wear abd binder anymore) Required Braces or Orthoses: Other Brace/Splint Other Brace/Splint: right wrist splint Restrictions Weight Bearing Restrictions: No   Vital Signs: Therapy Vitals Temp: 97.4 F (36.3 C) Pulse Rate: 58 (rn notified) Resp: 18 BP: 143/91 mmHg (rn notified) Patient Position, if appropriate: Lying Oxygen Therapy SpO2: 96 % O2 Device: None (Room  air) Pain: Pain Assessment Pain Assessment: No/denies pain Pain Score: 0-No pain  See FIM for current functional status  Therapy/Group: Individual Therapy  Wells Guiles B. [place], [place], DPT [phone]   05/22/2012, 4:16 PM

## 2012-05-23 ENCOUNTER — Ambulatory Visit (HOSPITAL_COMMUNITY): Payer: BC Managed Care – PPO | Admitting: *Deleted

## 2012-05-23 ENCOUNTER — Inpatient Hospital Stay (HOSPITAL_COMMUNITY): Payer: BC Managed Care – PPO | Admitting: Occupational Therapy

## 2012-05-23 ENCOUNTER — Inpatient Hospital Stay (HOSPITAL_COMMUNITY): Payer: BC Managed Care – PPO

## 2012-05-23 DIAGNOSIS — J90 Pleural effusion, not elsewhere classified: Secondary | ICD-10-CM

## 2012-05-23 DIAGNOSIS — J690 Pneumonitis due to inhalation of food and vomit: Secondary | ICD-10-CM

## 2012-05-23 DIAGNOSIS — G8250 Quadriplegia, unspecified: Secondary | ICD-10-CM

## 2012-05-23 DIAGNOSIS — D62 Acute posthemorrhagic anemia: Secondary | ICD-10-CM

## 2012-05-23 DIAGNOSIS — J189 Pneumonia, unspecified organism: Secondary | ICD-10-CM

## 2012-05-23 DIAGNOSIS — G7281 Critical illness myopathy: Secondary | ICD-10-CM

## 2012-05-23 LAB — CBC
MCH: 29 pg (ref 26.0–34.0)
MCHC: 33.1 g/dL (ref 30.0–36.0)
MCV: 87.7 fL (ref 78.0–100.0)
RBC: 2.76 MIL/uL — ABNORMAL LOW (ref 4.22–5.81)
WBC: 12.6 10*3/uL — ABNORMAL HIGH (ref 4.0–10.5)

## 2012-05-23 LAB — GLUCOSE, CAPILLARY
Glucose-Capillary: 113 mg/dL — ABNORMAL HIGH (ref 70–99)
Glucose-Capillary: 101 mg/dL — ABNORMAL HIGH (ref 70–99)

## 2012-05-23 LAB — PROTEIN, BODY FLUID: Total protein, fluid: 3.7 g/dL

## 2012-05-23 LAB — BODY FLUID CELL COUNT WITH DIFFERENTIAL
Eos, Fluid: 0 %
Monocyte-Macrophage-Serous Fluid: 10 % — ABNORMAL LOW (ref 50–90)

## 2012-05-23 LAB — CHOLESTEROL, TOTAL: Cholesterol: 120 mg/dL (ref 0–200)

## 2012-05-23 LAB — LACTATE DEHYDROGENASE, PLEURAL OR PERITONEAL FLUID: LD, Fluid: 252 U/L — ABNORMAL HIGH (ref 3–23)

## 2012-05-23 LAB — APTT: aPTT: 35 seconds (ref 24–37)

## 2012-05-23 LAB — PROTEIN, TOTAL: Total Protein: 6.4 g/dL (ref 6.0–8.3)

## 2012-05-23 MED ORDER — VITAL 1.5 CAL PO LIQD
1000.0000 mL | ORAL | Status: DC
  Administered 2012-05-23 – 2012-05-26 (×3): 1000 mL
  Filled 2012-05-23 (×6): qty 1000

## 2012-05-23 MED ORDER — FERROUS SULFATE 300 (60 FE) MG/5ML PO SYRP
300.0000 mg | ORAL_SOLUTION | Freq: Two times a day (BID) | ORAL | Status: DC
  Administered 2012-05-27 – 2012-06-05 (×19): 300 mg
  Filled 2012-05-23 (×22): qty 5

## 2012-05-23 MED ORDER — VITAL 1.5 CAL PO LIQD
160.0000 mL | ORAL | Status: DC
  Filled 2012-05-23 (×4): qty 237

## 2012-05-23 MED ORDER — CLONAZEPAM 0.5 MG PO TABS
0.7500 mg | ORAL_TABLET | Freq: Every day | ORAL | Status: DC
  Administered 2012-05-23 – 2012-05-28 (×6): 0.75 mg via ORAL
  Filled 2012-05-23 (×6): qty 2

## 2012-05-23 NOTE — Progress Notes (Signed)
NUTRITION FOLLOW UP  DOCUMENTATION CODES  Per approved criteria   -Severe malnutrition in the context of acute illness or injury    Intervention:    Continue the following enteral nutrition regimen:  Provide: 160 mL bolus Vital 1.5 administered over 30 minutes at 0800, 1300, 1800. Pt to receive bolus if meal intake <50%. Nocturnal continuous feedings of Vital 1.5 at 75 ml/hr x 2000 - 0600.  Once pt tolerates this, on next day provide: 237 mL bolus of Vital 1.5 administered over 30 minutes at 0800, 1300, 1800. Pt to receive bolus if meal intake <50%. Nocturnal continuous feedings of Vital 1.5 at 75 ml/hr x 2000 - 0600. Goal regimen will provide: 2190 kcal, 99 grams protein, 1116 ml free water. 2. RD to continue to follow nutrition care plan.  Nutrition Dx:   Inadequate oral intake related to poor appetite, early satiety, and lethargy as evidenced by poor meal completion. Ongoing.  Goal:   Intake to meet >90% of estimated nutrition needs. Met with enteral nutrition.  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 Pt with acute respiratory and renal failure with ARDS and septic shock. Pt recently discharged to CIR, however currently admitted as inpatient due to possible aspiration PNA (4/2).   Hospital course nutrition summary:  2/2 - 2/20: permissively underfed with EN  2/21-2/26: advanced to full feeds  2/26 - 3/3: feeds held for surgery  3/4 - 3/11: resumed full feeds  3/11 - 3/12: feeds held for GI bleed  3/13 - 3/24: received full feeds  3/24: TFs discontinued diet initiated; pt with very poor oral intake; tx to rehab 3/25  3/28 - 4/1: increased n/v - pt refusing almost all meals and supplements; wife declined calorie  count, requesting TPN  4/2: NGT placed to suction, surgery ordering TF initiation; tx back to acute with plans for trickle feedings.  4/3-4/4: Trickle feeds of Vital 1.2 @ 30 mL/hr  4/5: PEG placement, resume of Vital 1.2  4/6-4/9: Inconsistent TF infusion and frequent adjustment of rate. Pt has not yet reached >40 mL/hr with tolerance. Wt now 151 lbs.  4/10-11: Pt has advanced as scheduled to 60 mL/hr. Discussed RN. Plan to increase to 70 mL this afternoon.  4/14: Pt has reached goal TF rate of 75 mL/hr with tolerance. PO intake remains insufficient with improvement per pt's report.  4/15: Pt ready to begin transition to cyclic feeds. RD to order Vital 1.2 @ 85 mL/hr x21 hrs daily. Will continue with daily advancements as tolerated and shorter duration of tubefeeds.  4/16: Pt agreeable to change formula to Vital 1.5 @ 80 mL/hr x 18 hrs daily.  4/17: Transfer to CIR. Continues with order of Vital 1.5 @ 80 ml/hr x 18 hrs daily. 4/21: Initiation of 3-day calorie count; continue Vital 1.5 @ 80 ml/hr x 18 hrs daily. 4/22: Day 1 Calorie Count Results -824 kcal (37% of minimum estimated needs) and 13.5 protein (11% of minimum estimated needs) 4/25:  Pt continues cyclic regimen.  Calorie count complete.  Wife and patient informed of results. ecreased cyclic feeds overnight and start transition to bolus feeds. Changes in regimen based on calorie count results are listed in interventions above. 4/25: Pt tolerated 80 mL bolus Vital 1.5 administered over  30 minutes at 6p.  Resume Vital 1.5 @ 80 mL/hr x 15 hrs from 7p-10a. 4/27 - 4/28: Pt with low grade fevers and n/v. Did not attempt boluses over weekend per wife. 4/29: Received 2 x 80 ml boluses at Lunch and Dinner; Breakfast bolus skipped 2/2 x-ray  Pt did not receive bolus today, per wife pt did not have time to receive bolus this morning, as pt had to eat a quick breakfast and underwent thoracentesis. Per wife, pt received 80 ml bolus at lunch and dinner  yesterday.  Working towards d/c date of 5/14. Most recent temperature is 99.7 degrees F.   RD reviewed current enteral nutrition orders with RN on duty - RN denies any questions/concerns at this time.  Height: Ht Readings from Last 1 Encounters:  05/09/12 5' 10"  (1.778 m)    Weight Status:   Wt Readings from Last 1 Encounters:  05/23/12 139 lb 12.4 oz (63.4 kg)  Wt variable  Re-estimated needs:  Kcal: 2200 - 2400 Protein: 120 - 140 grams Fluid: > 2.2 liters  Skin: incisions, ileostomy, stage II pressure ulcer to R buttocks  Diet Order: General   Intake/Output Summary (Last 24 hours) at 05/23/12 1137 Last data filed at 05/23/12 0700  Gross per 24 hour  Intake    520 ml  Output    975 ml  Net   -455 ml    Last BM: 4/29 (775 ml output today via ostomy yesterday)  Labs:   Recent Labs Lab 05/21/12 0500  NA 135  K 3.9  CL 99  CO2 27  BUN 18  CREATININE 0.83  CALCIUM 8.8  GLUCOSE 128*    CBG (last 3)   Recent Labs  05/22/12 1630 05/22/12 2037 05/23/12 1115  GLUCAP 85 108* 87    Scheduled Meds: . aspirin  81 mg Per Tube Daily  . chlorhexidine  15 mL Mouth Rinse BID  . clonazePAM  0.75 mg Oral QHS  . ferrous sulfate  300 mg Per Tube TID WC  . free water  200 mL Per Tube Q3H  . insulin aspart  0-10 Units Subcutaneous TID WC  . insulin aspart  0-5 Units Subcutaneous QHS  . iron dextran (INFED/DEXFERRUM) infusion  250 mg Intravenous Daily  . levofloxacin  750 mg Oral q1800  . lidocaine  1 patch Transdermal Q24H  . lip balm   Topical BID  . loperamide  2 mg Per Tube QHS  . loperamide  2-4 mg Per Tube UD  . metoCLOPramide  10 mg Oral TID AC  . metoprolol tartrate  6.25 mg Per Tube Daily  . multivitamin  5 mL Per Tube Daily  . pantoprazole sodium  40 mg Per Tube Q1200  . saccharomyces boulardii  250 mg Oral BID    Continuous Infusions: . feeding supplement (VITAL 1.5 CAL) 1,000 mL (05/23/12 0851)    Inda Coke MS, RD, LDN Pager:  [phone] After-hours pager: [phone]

## 2012-05-23 NOTE — Progress Notes (Signed)
Physical Therapy Session Note  Patient Details  Name: Connor Small MRN: [id number] Date of Birth: 10-05-1960  Today's Date: 05/23/2012 Time: 1130-1200 Time Calculation (min): 30 min  Short Term Goals: Week 2:  PT Short Term Goal 1 (Week 2): Pt will be able to perform bed mobility with mod A for supine to sit PT Short Term Goal 2 (Week 2): Pt will be able to scoot on edge of mat (using LE's to push through) with mod A to aid with slide board transfers PT Short Term Goal 3 (Week 2): Pt will be able to propel w/c with hemi technique x 50' with mod A  Skilled Therapeutic Interventions/Progress Updates:    session focused on family education with pt's wife and daughter (wife doing hands on) for bed mobility (supine to sit) and slide board transfer with emphasis on safety, proper body mechanics, and importance of pt assisting with transfer to decrease physical burden on pt's wife. Education and discussion on goals, slow progress, and DME for home. Discussed hospital bed, w/c, and slide board at this time. Pt and family both verbalized understanding.  Therapy Documentation Precautions:  Precautions Precautions: Fall;Shoulder Type of Shoulder Precautions: right shoulder subluxation  Precaution Comments:  ostomy, tachycardia, Afib, Wound vac, peg tube (doesn't wear abd binder anymore) Required Braces or Orthoses: Other Brace/Splint Other Brace/Splint: right wrist splint Restrictions Weight Bearing Restrictions: No   Pain:  Sore where fluid was drawn off from - repositioned and rest breaks as needed.  See FIM for current functional status  Therapy/Group: Individual Therapy  [name] [facility] 05/23/2012, 12:15 PM

## 2012-05-23 NOTE — Progress Notes (Addendum)
Brief Nutrition Note  RD contacted by RN regarding clarification of TF order. Upon chart review, pt with two duplicate TF orders. RD discontinued one of the orders and verbalized plan with RN.  The plan is as follows: Provide: 160 mL bolus Vital 1.5 administered over 30 minutes at 0800, 1300, 1800. Pt to receive bolus if meal intake <50%. Nocturnal continuous feedings of Vital 1.5 at 75 ml/hr x 2000 - 0600.  Once pt tolerates this, tomorrow provide: 237 mL bolus of Vital 1.5 administered over 30 minutes at 0800, 1300, 1800. Pt to receive bolus if meal intake <50%. Nocturnal continuous feedings of Vital 1.5 at 75 ml/hr x 2000 - 0600. Goal regimen will provide: 2190 kcal, 99 grams protein, 1116 ml free water.  Please page RD with further questions.  Inda Coke MS, RD, LDN Pager: [phone] After-hours pager: [phone]

## 2012-05-23 NOTE — Progress Notes (Signed)
Social Work Patient ID: Connor Small, male   DOB: 15-Mar-1960, 52 y.o.   MRN: [id number]  Met with pt, wife and daughter this morning to review team conference and to follow up on discussion that Dr. Naaman Plummer had earlier with them.  Reviewed team concerns from conference that pt continues to require much assistance, needs encouragement to be more independent and that d/c directly home may not be best plan for them.  Wife and daughter continue to be very adamant with their decision for pt to d/c home.  Wife feels that a move to SNF/ LTAC will not be in pt's best "... Physical, emotional, spiritual or psychological best interests."  Reviewed with her what she and pt should REALISTICALLY expect from [facility] and therapy services.  They state understanding that these services will not be daily and that 99% of pt' care will be on family.  At this time, pt and family desire is to plan on d/c home on 06/06/12.  I have told pt (as have other team members) that our focus is for him to show as much independence as he possibly can.  Will also encourage wife to spend time away from hospital, but doubtful she will make any changes.  Continue to follow.  Tonjia Parillo, LCSW

## 2012-05-23 NOTE — Progress Notes (Signed)
Pt had beside thoracentesis, tolerated procedure well, bandaide to back dry and intact, wife refused IV iron to be given at 0800 and rescheduled to 1600, Dr. Tessa Lerner made aware, wife refused bolus tube feeding at 0800, pam Love PA notified

## 2012-05-23 NOTE — Progress Notes (Signed)
Physical Therapy Session Note  Patient Details  Name: Connor Small MRN: [id number] Date of Birth: 25-May-1960  Today's Date: 05/23/2012 Time: 1410-1500 Time Calculation (min): 50 min  Short Term Goals: Week 2:  PT Short Term Goal 1 (Week 2): Pt will be able to perform bed mobility with mod A for supine to sit PT Short Term Goal 2 (Week 2): Pt will be able to scoot on edge of mat (using LE's to push through) with mod A to aid with slide board transfers PT Short Term Goal 3 (Week 2): Pt will be able to propel w/c with hemi technique x 50' with mod A  Skilled Therapeutic Interventions/Progress Updates:    Pt nauseous at the beginning of session and again at end of session - limiting tolerance to activity this PM. Focused on slide board transfer onto the mat with total A, dynamic sitting balance and neuro re-ed with RUE for ring toss and reaching outside BOS/limits of stability using R and L UE. Using [name] Plus for standing tolerance and postural control, though limited due to nausea. Used [name] Plus for transfer back to w/c from mat and then from w/c to bed - mod A to return to supine to manage both LE's.   Therapy Documentation Precautions:  Precautions Precautions: Fall;Shoulder Type of Shoulder Precautions: right shoulder subluxation  Precaution Comments:  ostomy, tachycardia, Afib, Wound vac, peg tube (doesn't wear abd binder anymore) Required Braces or Orthoses: Other Brace/Splint Other Brace/Splint: right wrist splint Restrictions Weight Bearing Restrictions: No General: Amount of Missed PT Time (min): 10 Minutes Missed Time Reason: Patient ill (comment)   Pain: C/o back pain from fluid withdrawal and pt nauseous - RN aware.   See FIM for current functional status  Therapy/Group: Individual Therapy and Co-Treatment with Recreational Therapy  [name] [facility] 05/23/2012, 4:03 PM

## 2012-05-23 NOTE — Procedures (Signed)
Thoracentesis Procedure Note  Pre-operative Diagnosis: left pleural effusion  Post-operative Diagnosis: same  Indications: eval of fever and symptoms  Procedure Details  Consent: Informed consent was obtained. Risks of the procedure were discussed including: infection, bleeding, pain, pneumothorax.  Under sterile conditions the patient was positioned. Betadine solution and sterile drapes were utilized.  1% buffered lidocaine was used to anesthetize the pleural space identified by real time [place]. Fluid was obtained without any difficulties and minimal blood loss.  A dressing was applied to the wound and wound care instructions were provided.   Findings 1750 ml of concentrated orange tinged pleural fluid was obtained. A sample was sent to Pathology for cytogenetics, flow, and cell counts, as well as for infection analysis.  Complications:  None; patient tolerated the procedure well.          Condition: stable  Plan A follow up chest x-ray was ordered. Bed Rest for 1 hours. Tylenol 650 mg. for pain.  Attending Attestation: I was present and scrubbed for the entire procedure.  Chesley Mires, MD [facility] Pulmonary/Critical Care 05/23/2012, 11:12 AM Pager:  [phone] After 3pm call: [phone]

## 2012-05-23 NOTE — Progress Notes (Signed)
Occupational Therapy Session Note  Patient Details  Name: Connor Small MRN: [id number] Date of Birth: Sep 21, 1960  Today's Date: 05/23/2012  Short Term Goals: Week 1:  OT Short Term Goal 1 (Week 1): Self Feeding:  Patient will feed himself at least 50% of 2 meals per day during this recording period. OT Short Term Goal 1 - Progress (Week 1): Progressing toward goal OT Short Term Goal 2 (Week 1): Grooming:  Patient will perform 2 grooming tasks with min assist to include set up once items needed are provided OT Short Term Goal 2 - Progress (Week 1): Progressing toward goal OT Short Term Goal 3 (Week 1): UB Dressing:  Patient will don shirt in supported sit with max assist OT Short Term Goal 3 - Progress (Week 1): Met OT Short Term Goal 4 (Week 1): Sitting balance:  Patient will sit without back support with mod assist while completing simple BADL task at least 2 days this recording period. OT Short Term Goal 4 - Progress (Week 1): Met OT Short Term Goal 5 (Week 1): UE exercises:  Patient will tolerate BUE exercises at least 4 times during this recording period. OT Short Term Goal 5 - Progress (Week 1): Met  Week 2:  OT Short Term Goal 1 (Week 2): Patient will perform grooming tasks with minimal assistance OT Short Term Goal 2 (Week 2): Patient will perform LB dressing with moderate assistance (pants only) OT Short Term Goal 3 (Week 2): Patient will be able to direct care with slide board transfer with min verbal cues OT Short Term Goal 4 (Week 2): Patient will be able to direct UE exercises with min verbal cues  Skilled Therapeutic Interventions/Progress Updates:  Patient missed 60 minutes of skilled occupational therapy secondary to surgical procedure in room to remove fluid off lung.   Precautions:  Precautions Precautions: Fall;Shoulder Type of Shoulder Precautions: right shoulder subluxation  Precaution Comments:  ostomy, tachycardia, Afib, Wound vac, peg tube (doesn't wear abd  binder anymore) Required Braces or Orthoses: Other Brace/Splint Other Brace/Splint: right wrist splint Restrictions Weight Bearing Restrictions: No  See FIM for current functional status  Therapy/Group: Individual Therapy  Connor Small 05/23/2012, 10:54 AM

## 2012-05-23 NOTE — Progress Notes (Addendum)
Subjective/Complaints: Still with low grade temp. No real cough. Not staying asleep throughout the night.  A 12 point review of systems has been performed and if not noted above is otherwise negative.   Objective: Vital Signs: Blood pressure 112/60, pulse 121, temperature 99.7 F (37.6 C), temperature source Oral, resp. rate 18, height 5' 10"  (1.778 m), weight 63.4 kg (139 lb 12.4 oz), SpO2 97.00%. Dg Chest 2 View  05/22/2012  *RADIOLOGY REPORT*  Clinical Data: Fever and cough  CHEST - 2 VIEW  Comparison: 05/16/2012  Findings: Moderate left pleural effusion has increased from the prior study.  Hazy opacity projects throughout the left lung which may be due to the superimposed pleural fluid or asymmetric edema or infiltrate.  The right lung is clear.  The cardiac silhouette is normal in size.  No pneumothorax.  IMPRESSION: Enlarged left pleural effusion.  Possible left lung edema or infiltrates.  Clear right lung.   Original Report Authenticated By: Lajean Manes, M.D.     Recent Labs  05/21/12 0500 05/23/12 0514  WBC 12.2* 12.6*  HGB 8.3* 8.0*  HCT 25.1* 24.2*  PLT 463* 523*    Recent Labs  05/21/12 0500  NA 135  K 3.9  CL 99  GLUCOSE 128*  BUN 18  CREATININE 0.83  CALCIUM 8.8   CBG (last 3)   Recent Labs  05/22/12 1130 05/22/12 1630 05/22/12 2037  GLUCAP 98 85 108*    Wt Readings from Last 3 Encounters:  05/23/12 63.4 kg (139 lb 12.4 oz)  05/07/12 66.2 kg (145 lb 15.1 oz)  04/18/12 71.94 kg (158 lb 9.6 oz)    Physical Exam:  Constitutional: He appears well-developed. He has a sickly appearance.   HENT:  Head: Normocephalic and atraumatic.  Eyes: Pupils are equal, round, and reactive to light.  Neck: Normal range of motion.  Cardiovascular: Regular rhythm. Tachycardia present.  HR 90-100's during the exam.  Pulmonary/Chest: Effort normal. No respiratory distress.  He has no wheezes. Decreased sounds at the bases. Abdominal: He exhibits minimal distension.  Bowel sounds are increased. There is tenderness.  Midline incision with retention sutures in place--dehisced incision with pink, clean wound bed.  . ---continuing to close nicely--central area still with some depth Neurological: He is alert.   Basic insight and awareness. Was able to follow basic commands. Hoarse voice. 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 UE grossly  2to 3/5 LUE, 1-2/5 RUE with contracture in Hand (limited flexion). LE 1-2/5with weakness moreso in ankle dorsiflexion. DTR's 1+. Diffuse muscle wasting.  Skin: Skin is warm and dry.  Psychiatric: relaxed  Assessment/Plan:  1. 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 Physiatrist is providing close team supervision and 24 hour management of active medical problems listed below. Physiatrist and rehab team continue to assess barriers to discharge/monitor patient progress toward functional and medical goals.  Discussed with pt/wife the fact this will continue to be an extended rehab process. We will focus on preparation and family ed for dc home. She and pt need to be realistic about what they can handle safely at home. Mentioned SNF as potential option for further rehab.  FIM: FIM - Bathing Bathing Steps Patient Completed: Chest;Right Arm;Abdomen;Front perineal area;Right upper leg;Left upper leg Bathing: 3: Mod-Patient completes 5-7 66f10 parts or 50-74% (supine for LB in w/c for upper body)  FIM - Upper Body Dressing/Undressing Upper body  dressing/undressing steps patient completed: Thread/unthread right sleeve of pullover shirt/dresss;Thread/unthread left sleeve of pullover shirt/dress Upper body dressing/undressing: 3: Mod-Patient completed 50-74% of tasks FIM - Lower Body Dressing/Undressing Lower body dressing/undressing: 2: Max-Patient completed  25-49% of tasks  FIM - Toileting Toileting: 6: Assistive device: No helper  FIM - Air cabin crew Transfers: 0-Activity did not occur  FIM - Control and instrumentation engineer Devices: Sliding board;Arm rests Bed/Chair Transfer: 2: Bed > Chair or W/C: Max A (lift and lower assist)  FIM - Locomotion: Wheelchair Locomotion: Wheelchair: 1: Total Assistance/staff pushes wheelchair (Pt<25%) FIM - Locomotion: Ambulation Ambulation/Gait Assistance: Not tested (comment) Locomotion: Ambulation: 0: Activity did not occur  Comprehension Comprehension Mode: Auditory Comprehension: 6-Follows complex conversation/direction: With extra time/assistive device  Expression Expression Mode: Verbal Expression: 5-Expresses complex 90% of the time/cues < 10% of the time  Social Interaction Social Interaction Mode: Not assessed Social Interaction: 5-Interacts appropriately 90% of the time - Needs monitoring or encouragement for participation or interaction.  Problem Solving Problem Solving Mode: Not assessed Problem Solving: 5-Solves complex 90% of the time/cues < 10% of the time  Memory Memory Mode: Not assessed Memory: 5-Requires cues to use assistive device  Medical Problem List and Plan:  1. DVT Prophylaxis/Anticoagulation: Mechanical: Sequential compression devices, below knee Bilateral lower extremities thrombocytopenia resolved.   2. Multifactorial pain: tylenol, local measures, use ultram for more severe pain, HA 3. Mood: neuropsych consult requested. Needs encouragement  -klonopin to continue for now-will slightly increase and move back to 9pm 4. Neuropsych: This patient is not capable of making decisions on his/her own behalf.  5. FEN::   Serial labs, adjusting fluids, formula as well. Potassium slightly elevated--recheck Monday  -change to boluses at some point depending upon PO intake 6. Bilateral cardioembolic CVA: no blood thinners due to issues with  bleeding 7. ABLA:  Aranesp, iv fe++--may continue aranesp another week  -serial cbc's-  -no signs of overt blood loss  -continue observation and serial labs 8. 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 9. Low grade temp, leukocytosis- urine culture negative, cxr with continued increasing left effusion, +/- infiltrate  -continue levaquin  -will ask pulmonary to follow up  -IS  -?small thrombus right calf on doppler---doubt this would contribute to temp  -consider ID consult  LOS (Days) 14 A FACE TO FACE EVALUATION WAS PERFORMED  Leonda Cristo T 05/23/2012 7:56 AM

## 2012-05-23 NOTE — Progress Notes (Addendum)
Social Work Patient ID: Connor Small, male   DOB: December 27, 1960, 52 y.o.   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  Aspiration pneumonitis  04/26/2012   .  Clostridium difficile colitis  04/26/2012   .  Intra-abdominal fluid collection/LUQ  04/26/2012   .  CKD (chronic kidney disease) stage 2, GFR 60-89 ml/min  04/26/2012   .  Acute renal failure  04/26/2012   .  Hypokalemia  04/26/2012   .  Protein-calorie malnutrition, severe  04/25/2012   .  Spleen hematoma - subcapsular & without rupture   04/25/2012   .  Ascites s/p US guide paracentesis (4/2)  04/25/2012   .  Ileostomy in place  04/24/2012   .  HAP (hospital-acquired pneumonia)  04/21/2012   .  Critical illness myopathy and neuropathy  04/18/2012   .  Acute blood loss anemia  04/18/2012   .  S/p total colectomy for toxic megacolon, C diff  03/23/2012   .  Hypernatremia, improving  03/23/2012   .  Severe muscle deconditioning  03/23/2012   .  Shoulder subluxation, right  03/23/2012   .  CVA, 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  03/23/2012   .  Coagulopathy  03/23/2012   .  Quadriparesis  03/17/2012   .  Leukocytosis, unspecified  03/15/2012   .  Hyperbilirubinemia, due to cholestasis  03/15/2012   .  PAF (paroxysmal atrial fibrillation)  03/15/2012   .  Anemia of chronic disease  03/15/2012   .  Pleural effusion, L > R  03/04/2012   .  Sinus tachycardia   02/28/2012   .  Hematuria  02/28/2012   .  history of UC (ulcerative colitis)  12/06/2010     Expected Discharge Date: Expected Discharge Date: 06/06/12  Team Members Present: Physician leading conference: Dr. Faith Rogue Social Worker Present: Amada Jupiter, LCSW Nurse Present:  Keturah Barre, RN) PT Present: Karolee Stamps, PT OT Present: Mackie Pai, OT;Patricia Mat Carne, OT Other (Discipline and Name): Tora Duck, PPS Coordinator and Bayard Hugger, RN and Ottie Glazier, RN        Current Status/Progress  Goal  Weekly Team Focus   Medical     low grade temp, pain issues. ongoingmotivaional issues. moving toward bolus feeds, wound care  increase stamina, stabilize medical  rx fever, ?pneumonia, pain mgt, GI and nutritional mgt   Bowel/Bladder     cont of bladder/ ileostomy  remain cont of bladder  remain cont of bladder   Swallow/Nutrition/ Hydration            ADL's     mod assist for UB dressing, max assist for LB dressing, max assist for slide board transfers  overall mod assist; supervision for grooming tasks, self-feeding tasks, and dynamic sitting balance  standing balance/tolerace/endurance (using SARA PLUS prn), functional use of  BUEs, ROM -> BUEs, self-feeding, groming tasks, UB/LB bathing & dressing, family education, ADL transfers   Mobility     max A for slide board transfers; mod A short distance w/c propulsion, lift equipment for standing  mod A basic transfers w/c level; min A w/c mobility  activity tolerance, postural control/NMR, transfers, strengthening/ROM/stretching, standing tolerance   Communication            Safety/Cognition/ Behavioral Observations    Min A-Supervision  Mod I  initiation, attention, working memory    Pain     denies pain  pain less than or equal to 3/10  pain less than 3/10   Skin     ABD wound w/retention sutures, qd dressing change  no further skin brweakdown/infection  free of skin breakdown and infection     *See Care Plan and  progress notes for long and short-term goals.    Barriers to Discharge:  see below, limited insight      Possible Resolutions to Barriers:    see below, may need longer term venue for rehab      Discharge Planning/Teaching Needs:    Home with wife and daughter to provide 24/7 assistance      Team Discussion:    Minimal gains made this week, however, pt seems less resistant.  Pt and family very focused on walking and team trying to be as realistic as possible with them that this is not a goal.  Wife remains very involved and learning all pt's care without difficulty.  Team feels pt needs to take much more initiative.  Also feel that consideration of SNF or LTAC is more appropriate.  SW and MD to follow up .   Revisions to Treatment Plan:    None at this poitn.    Continued Need for Acute Rehabilitation Level of Care: The patient requires daily medical management by a physician with specialized training in physical medicine and rehabilitation for the following conditions: Daily direction of a multidisciplinary physical rehabilitation program to ensure safe treatment while eliciting the highest outcome that is of practical value to the patient.: Yes Daily medical management of patient stability for increased activity during participation in an intensive rehabilitation regime.: Yes Daily analysis of laboratory values and/or radiology reports with any subsequent need for medication adjustment of medical intervention for : Neurological problems;Other;Post surgical problems;Pulmonary problems  Connor Small 05/23/2012, 10:34 AM

## 2012-05-23 NOTE — Progress Notes (Addendum)
Recreational Therapy Session Note  Patient Details  Name: Connor Small MRN: [id number] Date of Birth: 07-Dec-1960 Today's Date: 05/23/2012 Time:  1410-1500 Pain: c/o back pain from fluid withdrawal, c/o nausea, RN aware Skilled Therapeutic Interventions/Progress Updates: Pt with c/o nausea at the beginning of session and again at end of session - limiting tolerance to activity this PM. Focused on slide board transfer onto the mat with total A, dynamic sitting balance and functional use of RUE for ring toss and reaching outside BOS using R and L UE, min assist for RUE use & supervision for balance. Attempted standing activity using [name] Plus, but pt with c/o nausea and needed to sit back down.  Used [name] Plus for transfer back to w/c from mat and then from w/c to bed.  Therapy/Group: Co-Treatment  Aveen Stansel 05/23/2012, 4:12 PM

## 2012-05-23 NOTE — Patient Care Conference (Addendum)
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 Aspiration pneumonitis 04/26/2012  . Clostridium difficile colitis 04/26/2012  . Intra-abdominal fluid collection/LUQ 04/26/2012  . CKD (chronic kidney disease) stage 2, GFR 60-89 ml/min 04/26/2012  . Acute renal failure 04/26/2012  . Hypokalemia 04/26/2012  . Protein-calorie malnutrition, severe 04/25/2012  . Spleen hematoma - subcapsular & without rupture  04/25/2012  . Ascites s/p US guide paracentesis (4/2) 04/25/2012  . Ileostomy in place 04/24/2012  . HAP (hospital-acquired pneumonia) 04/21/2012  . 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 Acute blood loss anemia 04/18/2012  . S/p total colectomy for toxic megacolon, C diff 03/23/2012  . Hypernatremia, improving 03/23/2012  . Severe muscle deconditioning 03/23/2012  . Shoulder subluxation, right 03/23/2012  . 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 Coagulopathy 03/23/2012  . Quadriparesis 03/17/2012  . Leukocytosis, unspecified 03/15/2012  . Hyperbilirubinemia, due to cholestasis 03/15/2012  . PAF (paroxysmal atrial fibrillation) 03/15/2012  . Anemia of chronic disease 03/15/2012  . Pleural effusion, L > R 03/04/2012  . Sinus tachycardia 02/28/2012  . Hematuria 02/28/2012  . history of UC (ulcerative colitis) 12/06/2010    Expected Discharge Date: Expected Discharge Date: 06/06/12  Team Members  Present: Physician leading conference: Dr. Faith Rogue Social Worker Present: Amada Jupiter, LCSW Nurse Present:  Keturah Barre, RN) PT Present: Karolee Stamps, PT OT Present: Mackie Pai, OT;Patricia Mat Carne, OT Other (Discipline and Name): Tora Duck, PPS Coordinator and Bayard Hugger, RN and Ottie Glazier, RN     Current Status/Progress Goal Weekly Team Focus  Medical   low grade temp, pain issues. ongoingmotivaional issues. moving toward bolus feeds, wound care  increase stamina, stabilize medical  rx fever, ?pneumonia, pain mgt, GI and nutritional mgt   Bowel/Bladder   cont of bladder/ ileostomy  remain cont of bladder  remain cont of bladder   Swallow/Nutrition/ Hydration             ADL's   mod assist for UB dressing, max assist for LB dressing, max assist for slide board transfers  overall mod assist; supervision for grooming tasks, self-feeding tasks, and dynamic sitting balance  standing balance/tolerace/endurance (using SARA PLUS prn), functional use of BUEs, ROM -> BUEs, self-feeding, groming tasks, UB/LB bathing & dressing, family education, ADL transfers   Mobility   max A for slide board transfers; mod A short distance w/c propulsion, lift equipment for standing  mod A basic transfers w/c level; min A w/c mobility  activity tolerance, postural control/NMR, transfers, strengthening/ROM/stretching, standing tolerance   Communication             Safety/Cognition/ Behavioral Observations  Min A-Supervision  Mod I  initiation, attention, working memory    Pain   denies pain  pain less than or equal to 3/10  pain less than 3/10   Skin   ABD wound w/retention sutures, qd dressing change  no further skin  brweakdown/infection  free of skin breakdown and infection      *See Care Plan and progress notes for long and short-term goals.  Barriers to Discharge: see below, limited insight    Possible Resolutions to Barriers:  see below, may need longer term venue for rehab     Discharge Planning/Teaching Needs:  Home with wife and daughter to provide 24/7 assistance      Team Discussion:  Minimal gains made this week, however, pt seems less resistant.  Pt and family very focused on walking and team trying to be as realistic as possible with them that this is not a goal.  Wife remains very involved and learning all pt's care without difficulty.  Team feels pt needs to take much more initiative.  Also feel that consideration of SNF or LTAC is more appropriate.  SW and MD to follow up .  Revisions to Treatment Plan:  None at this poitn.   Continued Need for Acute Rehabilitation Level of Care: The patient requires daily medical management by a physician with specialized training in physical medicine and rehabilitation for the following conditions: Daily direction of a multidisciplinary physical rehabilitation program to ensure safe treatment while eliciting the highest outcome that is of practical value to the patient.: Yes Daily medical management of patient stability for increased activity during participation in an intensive rehabilitation regime.: Yes Daily analysis of laboratory values and/or radiology reports with any subsequent need for medication adjustment of medical intervention for : Neurological problems;Other;Post surgical problems;Pulmonary problems  [name] 05/23/2012, 10:34 AM

## 2012-05-23 NOTE — Consult Note (Signed)
PULMONARY  / CRITICAL CARE MEDICINE  Name: Connor Small MRN: [id number] DOB: 1960-04-03    ADMISSION DATE:  05/09/2012 CONSULTATION DATE:  4/30  REFERRING MD :  Riley Kill PRIMARY SERVICE:  rehab  CHIEF COMPLAINT:  Left effusion   BRIEF PATIENT DESCRIPTION:  82 YOM admitted to [facility] for Cdiff on 2/2. Underwent decompressive cecostomy 2/3, Hospital course complicated by ARDS, PNA, septic shock, bilateral cardioembolic CVAs, renal failure, wound dehiscence and required multiple OR visits. To  rehab, aspiration event 3/29-->treated, back to rehab. PCCM asked once again to see on 4/30 for on going low gd temp,  left effusion (which has been chronic since feb 2014, but has gotten a little worse).    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 Overlying bowel gas and patientclinical status. 2. No explanation for elevated liver function tests.3. Similar to slight increase in size of a perisplenic fluidcollection.4. Right pleural effusion  3/28 Called back to see on rehab for ? Asp    LINES / TUBES:  CULTURES: UC 4/22: insig growth   ANTIBIOTICS: levaquin 4/22>>>  HISTORY OF PRESENT ILLNESS:   47 YOM admitted to [facility] for Cdiff on 2/2. Underwent decompressive cecostomy 2/3, Hospital course complicated by ARDS, PNA, septic shock, bilateral cardioembolic CVAs,renal failure, wound dehiscence and required multiple OR visits. To [facility] rehab, aspiration event 3/29-->treated, back to rehab 4/17. Since this time his progress has really been fairly slow w/ severe deconditioning  felt d/t critical illness myopathy/neuropathy.  Has been having low grade fever since 4/22 w/ sl increase in WBCs. [name]. [name], and also some worsening of his left effusion, which has really been a chronic issue dating back to feb. [name]. PCCM asked once again to see on 4/30 for left effusion and question as to if this could be contributing to his fever.  PAST MEDICAL HISTORY :  Past Medical History  Diagnosis Date  . Chronic diarrhea   . Rectal bleed   . Hemorrhoids   . Ulcerative colitis     Distal UC over 8 yrs ago diagnosed  . Diverticulitis of large intestine with perforation 10/2011    done at [place]  . 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 history of UC (ulcerative colitis) 12/06/2010   Past Surgical History  Procedure Laterality Date  . Colonoscopy  9/03  . Sigmoidoscopy       06/13/2002  . Temporary ostomy November 06, 2010      for a colon perforation that was done in [facility] (Dr Ruben Im).    . Colonoscopy  02/16/2011    Procedure: COLONOSCOPY;  Surgeon: Malissa Hippo, MD;  Location: [facility] SUITE;  Service: Endoscopy;  Laterality: N/A;  100  . Flexible sigmoidoscopy  02/27/2012    Procedure: FLEXIBLE SIGMOIDOSCOPY;  Surgeon: Malissa Hippo, MD;  Location: [facility] ENDO SUITE;  Service: Endoscopy;  Laterality:  N/A;  with colonic decompression  . Laparotomy  02/27/2012    Procedure: EXPLORATORY LAPAROTOMY;  Surgeon: Fabio Bering, MD;  Location: [facility] ORS;  Service: General;  Laterality: N/A;  . Cecostomy  02/27/2012    Procedure: CECOSTOMY;  Surgeon: Fabio Bering, MD;  Location: [facility] ORS;  Service: General;  Laterality: N/A;  Cecostomy Tube Placement  . Colectomy  02/28/2012    Procedure: TOTAL COLECTOMY;  Surgeon: Fabio Bering, MD;  Location: [facility] ORS;  Service: General;  Laterality: N/A;  . Ileostomy  02/28/2012     Procedure: ILEOSTOMY;  Surgeon: Fabio Bering, MD;  Location: [facility] ORS;  Service: General;  Laterality: N/A;  . Cystoscopy w/ ureteral stent placement Bilateral 03/20/2012    Procedure: CYSTOSCOPY WITH RETROGRADE PYELOGRAM/URETERAL STENT PLACEMENT;  Surgeon: Sebastian Ache, MD;  Location: [facility] OR;  Service: Urology;  Laterality: Bilateral;  . Laparotomy N/A 03/26/2012    Procedure: EXPLORATORY LAPAROTOMY;  Surgeon: Mariella Saa, MD;  Location: [facility] OR;  Service: General;  Laterality: N/A;  . Application of wound vac N/A 03/26/2012    Procedure: APPLICATION OF WOUND VAC;  Surgeon: Mariella Saa, MD;  Location: [facility] OR;  Service: General;  Laterality: N/A;  . Liver biopsy N/A 03/26/2012    Procedure: LIVER BIOPSY;  Surgeon: Mariella Saa, MD;  Location: [facility] OR;  Service: General;  Laterality: N/A;  . Laparotomy N/A 03/29/2012    Procedure: EXPLORATORY LAPAROTOMY, PARTIAL WOUND CLOSURE;  Surgeon: Mariella Saa, MD;  Location: [facility] OR;  Service: General;  Laterality: N/A;  . Vacuum assisted closure change N/A 03/29/2012    Procedure: Open ABDOMINAL VACUUM  CHANGE;  Surgeon: Mariella Saa, MD;  Location: [facility] OR;  Service: General;  Laterality: N/A;  . Vacuum assisted closure change N/A 04/01/2012    Procedure: removal of abdominal vac dressing and abdominal closure;  Surgeon: Mariella Saa, MD;  Location: [facility] OR;  Service: General;  Laterality: N/A;   Prior to Admission medications   Not on File   Allergies  Allergen Reactions  . Penicillins Other (See Comments)    Heart rate changes  . Morphine And Related     Nausea/vomiting  . Oxycodone     Nausea/vomiting  . Imipenem Rash    FAMILY HISTORY:  Family History  Problem Relation Age of Onset  . Cancer Sister   . Healthy Daughter   . Hypertension Mother    SOCIAL HISTORY:  reports that he quit smoking about 27 years ago. His smoking use included Cigarettes. He smoked 0.00 packs per day. He has never used smokeless tobacco.  He reports that he does not drink alcohol or use illicit drugs.  REVIEW OF SYSTEMS:   Constitutional: Negative for fever, chills, weight loss, malaise/fatigue and diaphoresis.  HENT: Negative for hearing loss, ear pain, nosebleeds, congestion, sore throat, neck pain, tinnitus and ear discharge.   Eyes: Negative for blurred vision, double vision, photophobia, pain, discharge and redness.  Respiratory: Negative for cough which has been NP, hemoptysis, sputum production, shortness of breath, wheezing and stridor.   Cardiovascular: Negative for chest pain, palpitations, orthopnea, claudication, leg swelling and PND.  Gastrointestinal: Negative for heartburn, nausea, vomiting, abdominal pain, diarrhea, constipation, blood in stool and melena.  Genitourinary: Negative for dysuria, urgency, frequency, hematuria and flank pain.  Musculoskeletal: Negative for myalgias, back pain, joint pain and falls.  Skin: Negative for itching and rash.  Neurological: Negative for dizziness, tingling, tremors, sensory change, speech change, focal weakness, seizures, loss  of consciousness, weakness and headaches.  Endo/Heme/Allergies: Negative for environmental allergies and polydipsia. Does not bruise/bleed easily.  SUBJECTIVE:  See ROS   VITAL SIGNS: Temp:  [98.7 F (37.1 C)-99.7 F (37.6 C)] 99.7 F (37.6 C) (04/30 0500) Pulse Rate:  [121-124] 121 (04/30 0500) Resp:  [18] 18 (04/30 0500) BP: (103-112)/(60-67) 112/60 mmHg (04/30 0500) SpO2:  [97 %] 97 % (04/30 0500) Weight:  [63.4 kg (139 lb 12.4 oz)] 63.4 kg (139 lb 12.4 oz) (04/30 0500) Room air  PHYSICAL EXAMINATION: General:  Awake, oriented, chronically ill appearing  Neuro:  Oriented. Generalized weakness w/ RUE weakness. Some short term mem def  HEENT:  [place], no JVD Cardiovascular:  reg Lungs:  Decreased left  Abdomen:  Mid abd dressing intact, ostomy draining. PEG unremark Musculoskeletal:  Weak, no sig swelling    Recent Labs Lab  05/21/12 0500  NA 135  K 3.9  CL 99  CO2 27  BUN 18  CREATININE 0.83  GLUCOSE 128*    Recent Labs Lab 05/20/12 0614 05/21/12 0500 05/23/12 0514  HGB 8.0* 8.3* 8.0*  HCT 23.7* 25.1* 24.2*  WBC 13.1* 12.2* 12.6*  PLT 472* 463* 523*   Dg Chest 2 View  05/22/2012  *RADIOLOGY REPORT*  Clinical Data: Fever and cough  CHEST - 2 VIEW  Comparison: 05/16/2012  Findings: Moderate left pleural effusion has increased from the prior study.  Hazy opacity projects throughout the left lung which may be due to the superimposed pleural fluid or asymmetric edema or infiltrate.  The right lung is clear.  The cardiac silhouette is normal in size.  No pneumothorax.  IMPRESSION: Enlarged left pleural effusion.  Possible left lung edema or infiltrates.  Clear right lung.   Original Report Authenticated By: Amie Portland, M.D.    Large chronic left effusion. A little bigger when c/w prior films.  ASSESSMENT / PLAN: Left pleural effusion. Chronic since feb but somewhat bigger. Was exudate in past anticipate it will be again. Given fever and mild increase reasonable to eval further.  Plan [place] chest Left thora for full evaluation of fluids. Repeat CXR after.  Depending on f/u film and how much fluid removed may require CT chest after   Pulmonary and Critical Care [facility] Pager: [phone]  05/23/2012, 8:51 AM  Reviewed above, examined pt, and agree with assessment/plan.  He has increased WBC, low grade temperature, and enlarging Rt pleural effusion.  Will continue current Abx for now.  Will proceed with Lt thoracentesis and send fluid for analysis.  Updated family at bedside about plan.  Coralyn Helling, MD [facility] Pulmonary/Critical Care 05/23/2012, 11:11 AM Pager:  [phone] After 3pm call: [phone]

## 2012-05-24 ENCOUNTER — Inpatient Hospital Stay (HOSPITAL_COMMUNITY): Payer: BC Managed Care – PPO | Admitting: Speech Pathology

## 2012-05-24 ENCOUNTER — Inpatient Hospital Stay (HOSPITAL_COMMUNITY): Payer: BC Managed Care – PPO | Admitting: Occupational Therapy

## 2012-05-24 ENCOUNTER — Ambulatory Visit (HOSPITAL_COMMUNITY): Payer: BC Managed Care – PPO | Admitting: *Deleted

## 2012-05-24 LAB — FUNGAL STAIN
Fungal Smear: NONE SEEN
Special Requests: NORMAL

## 2012-05-24 LAB — GLUCOSE, CAPILLARY
Glucose-Capillary: 114 mg/dL — ABNORMAL HIGH (ref 70–99)
Glucose-Capillary: 79 mg/dL (ref 70–99)

## 2012-05-24 LAB — CBC
Hemoglobin: 8.7 g/dL — ABNORMAL LOW (ref 13.0–17.0)
MCHC: 32.3 g/dL (ref 30.0–36.0)
Platelets: 514 10*3/uL — ABNORMAL HIGH (ref 150–400)
RBC: 3.06 MIL/uL — ABNORMAL LOW (ref 4.22–5.81)

## 2012-05-24 MED ORDER — VITAL 1.5 CAL PO LIQD
237.0000 mL | ORAL | Status: DC
  Filled 2012-05-24: qty 1000

## 2012-05-24 MED ORDER — VITAL 1.5 CAL PO LIQD
237.0000 mL | ORAL | Status: DC
  Administered 2012-05-24 – 2012-05-27 (×4): 237 mL
  Filled 2012-05-24 (×4): qty 237
  Filled 2012-05-24: qty 1000

## 2012-05-24 NOTE — Progress Notes (Signed)
Speech Language Pathology Session & Weekly Progress Notes  Patient Details  Name: Connor Small MRN: [id number] Date of Birth: 1960-07-19  Today's Date: 05/24/2012 Time: 2505-3976 Time Calculation (min): 25 min  Short Term Goals: Week 2: SLP Short Term Goal 1 (Week 2): Pt will perform vocal function exercises with Mod I.  SLP Short Term Goal 1 - Progress (Week 2): Met SLP Short Term Goal 2 (Week 2): Pt will demonstrate functional problem solving with basic and familiar tasks with supervision verbal cues.  SLP Short Term Goal 2 - Progress (Week 2): Met SLP Short Term Goal 3 (Week 2): Pt will utilize external memory aids to recall new, daily information with supervision verbal cues.  SLP Short Term Goal 3 - Progress (Week 2): Met SLP Short Term Goal 4 (Week 2): pt will demonstrate selective attention to a functional task for 30 minutes with min verbal cues.  SLP Short Term Goal 4 - Progress (Week 2): Met SLP Short Term Goal 5 (Week 2): Pt will initiate functional tasks with supervision verbal and question cues.  SLP Short Term Goal 5 - Progress (Week 2): Met  New Short Term Goals:  Week 3: SLP Short Term Goal 1 (Week 3): Pt will initiate tasks with Mod I SLP Short Term Goal 2 (Week 3): Pt will demonstrate alternating attention between two functional tasks with supervision verbal cues for redirection for 30 minutes SLP Short Term Goal 3 (Week 3): Pt will utilize external memory aids to recall new, daily information with Mod I SLP Short Term Goal 4 (Week 3): Pt will demonstrate functional problem solving with complex tasks with supervision verbal cues.   Weekly Progress Updates: Pt has made functional gains and has met 5 of 5 STG's this reporting period due to improvements in initiation, problem solving, attention and working memory. Currently, pt is overall supervision for recall of new daily information, working memory, initiation and selective attention and requires Min A verbal cueing for  complex problem solving task.  Pt also demonstrates increased vocal intensity with increased PO intake with meals and is overall Mod I for utilization of swallowing compensatory strategies. Pt would benefit from continued skilled SLP intervention to maximize cognitive recovery and functional independence. Pt's care plan has changed to 3x/week for skilled SLP intervention.   SLP Intensity: Minumum of 1-2 x/day, 30 to 90 minutes SLP Frequency: 3 out of 7 days SLP Duration/Estimated Length of Stay: 2 weeks SLP Treatment/Interventions: Cognitive remediation/compensation;Cueing hierarchy;Functional tasks;Environmental controls;Internal/external aids;Patient/family education;Therapeutic Activities  Daily Session Skilled Therapeutic Intervention: Treatment focus on cognitive goals. Pt recalled previous therapy events with Mod I and directed his care in regards to decision making with alternative nutrition with supervision question cues. Pt utilized an increased vocal intensity at the sentence level with Mod I.  FIM:  Comprehension Comprehension Mode: Auditory Comprehension: 6-Follows complex conversation/direction: With extra time/assistive device Expression Expression Mode: Verbal Expression: 6-Expresses complex ideas: With extra time/assistive device Social Interaction Social Interaction: 5-Interacts appropriately 90% of the time - Needs monitoring or encouragement for participation or interaction. Problem Solving Problem Solving: 5-Solves basic problems: With no assist Memory Memory: 5-Recognizes or recalls 90% of the time/requires cueing < 10% of the time Pain No/Denies Pain  Therapy/Group: Individual Therapy  Gean Larose 05/24/2012, 3:48 PM

## 2012-05-24 NOTE — Progress Notes (Signed)
PULMONARY  / CRITICAL CARE MEDICINE  Name: Connor Small MRN: [id number] DOB: [birth year]    ADMISSION DATE:  05/09/2012 CONSULTATION DATE:  4/30  REFERRING MD :  Riley Kill PRIMARY SERVICE:  rehab  CHIEF COMPLAINT:  Left effusion   BRIEF PATIENT DESCRIPTION:  82 YOM admitted to [facility] for Cdiff on 2/2. Underwent decompressive cecostomy 2/3, Hospital course complicated by ARDS, PNA, septic shock, bilateral cardioembolic CVAs, renal failure, wound dehiscence and required multiple OR visits. To  rehab, aspiration event 3/29-->treated, back to rehab. PCCM asked once again to see on 4/30 for on going low gd temp,  left effusion (which has been chronic since feb 2014, but has gotten a little worse).    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 Overlying bowel gas and patientclinical status. 2. No explanation for elevated liver function tests.3. Similar to slight increase in size of a perisplenic fluidcollection.4. Right pleural effusion  3/28 Called back to see on rehab for ? Asp    LINES / TUBES:  CULTURES: UC 4/22: insig growth   ANTIBIOTICS: levaquin 4/22>>>  HISTORY OF PRESENT ILLNESS:   69 YOM admitted to [facility] for Cdiff on 2/2. Underwent decompressive cecostomy 2/3, Hospital course complicated by ARDS, PNA, septic shock, bilateral cardioembolic CVAs,renal failure, wound dehiscence and required multiple OR visits. To [facility] rehab, aspiration event 3/29-->treated, back to rehab 4/17. Since this time his progress has really been fairly slow w/ severe deconditioning  felt d/t critical illness myopathy/neuropathy.  Has been having low grade fever since 4/22 w/ sl increase in WBCs. [name]. [name], and also some worsening of his left effusion, which has really been a chronic issue dating back to feb. [name]. PCCM asked once again to see on 4/30 for left effusion and question as to if this could be contributing to his fever.  SUBJECTIVE:   VITAL SIGNS: Temp:  [99.2 F (37.3 C)-100.9 F (38.3 C)] 99.3 F (37.4 C) (05/01 0513) Pulse Rate:  [118-128] 118 (05/01 0802) Resp:  [18] 18 (05/01 0513) BP: (107-118)/(59-68) 118/68 mmHg (05/01 0802) SpO2:  [96 %-97 %] 96 % (05/01 0513) Weight:  [65.499 kg (144 lb 6.4 oz)] 65.499 kg (144 lb 6.4 oz) (05/01 0513) Room air  PHYSICAL EXAMINATION: General:  Awake, oriented, chronically ill appearing  Neuro:  Oriented. Generalized weakness w/ RUE weakness. Some short term mem def  HEENT:  [place], no JVD Cardiovascular:  reg Lungs:  Decreased left  Abdomen:  Mid abd dressing intact, ostomy draining. PEG unremark Musculoskeletal:  Weak, no sig swelling    Recent Labs Lab 05/21/12 0500  NA 135  K 3.9  CL 99  CO2 27  BUN 18  CREATININE 0.83  GLUCOSE 128*    Recent Labs Lab 05/21/12 0500 05/23/12 0514 05/24/12 0625  HGB 8.3* 8.0* 8.7*  HCT 25.1* 24.2* 26.9*  WBC 12.2* 12.6* 13.6*  PLT 463* 523* 514*   Dg Chest Port 1 View  05/23/2012  *RADIOLOGY REPORT*  Clinical Data: Post thoracentesis.  PORTABLE CHEST - 1 VIEW  Comparison: 05/22/2012  Findings: Decreasing left pleural effusion thought following thoracentesis.  Small to moderate left pleural effusion persists with left lower lobe atelectasis or infiltrate, also improved.  No pneumothorax.  The heart is normal size.  No confluent opacity on the right.  IMPRESSION: Decreasing left effusion and left  lung airspace disease following thoracentesis.  No pneumothorax.   Original Report Authenticated By: Charlett Nose, M.D.    ASSESSMENT / PLAN: Left pleural effusion. Chronic since feb but somewhat bigger. Was exudate in past anticipate it will be again. Given fever and mild increase reasonable to eval further.  Plan - await cytology, GS and cx on L pleural fluid -He is on day 9 levaquin, ? Planned duration. Would not stop until cx data from thora available - consider CT scan chest at some point; would probably benefit most if his effusion were drained dry, then get CT. No clear indication to push for this at this time   Updated family at bedside about plan.  Levy Pupa, MD, PhD 05/24/2012, 10:16 AM [place] [facility] [phone] or if no answer [phone]

## 2012-05-24 NOTE — Progress Notes (Signed)
Occupational Therapy Session Note  Patient Details  Name: SCHNEUR CROWSON MRN: [id number] Date of Birth: 12/04/1960  Today's Date: 05/24/2012  Short Term Goals: Week 1:  OT Short Term Goal 1 (Week 1): Self Feeding:  Patient will feed himself at least 50% of 2 meals per day during this recording period. OT Short Term Goal 1 - Progress (Week 1): Progressing toward goal OT Short Term Goal 2 (Week 1): Grooming:  Patient will perform 2 grooming tasks with min assist to include set up once items needed are provided OT Short Term Goal 2 - Progress (Week 1): Progressing toward goal OT Short Term Goal 3 (Week 1): UB Dressing:  Patient will don shirt in supported sit with max assist OT Short Term Goal 3 - Progress (Week 1): Met OT Short Term Goal 4 (Week 1): Sitting balance:  Patient will sit without back support with mod assist while completing simple BADL task at least 2 days this recording period. OT Short Term Goal 4 - Progress (Week 1): Met OT Short Term Goal 5 (Week 1): UE exercises:  Patient will tolerate BUE exercises at least 4 times during this recording period. OT Short Term Goal 5 - Progress (Week 1): Met  Week 2:  OT Short Term Goal 1 (Week 2): Patient will perform grooming tasks with minimal assistance OT Short Term Goal 2 (Week 2): Patient will perform LB dressing with moderate assistance (pants only) OT Short Term Goal 3 (Week 2): Patient will be able to direct care with slide board transfer with min verbal cues OT Short Term Goal 4 (Week 2): Patient will be able to direct UE exercises with min verbal cues  Skilled Therapeutic Interventions/Progress Updates:   SESSION #1 1005-1100 - 55 Minutes Individual Therapy No complaints of pain Patient found supine in bed. Therapist and wife engaged patient in AAROM/stretching exercises -[place]. After exercises patient and wife engaged in bed mobility for patient to sit edge of bed for UB & LB dressing. Patient stood with SARA PLUS to  pull pants up to waist. From there patient sat back on edge of bed and wife assisted patient with slide board transfer. Wife able to perform transfer with minimal verbal cues from therapist and patient directing care prn. Wife positioned patient in w/c. Therapist left patient seated in w/c with wife and daughter present in room.   SESSION #2 6314-9702 - 15 Minutes - Calculated OT time 1400-1430 - 30 Minutes - Treatment time Co-Treatment with physical therapy No complaints of pain Co-Treatment with physical therapy focusing on bilateral LE strengthening and sit<>stands using EVA walker. Patient able to perform sit<>stands with total +2 assist (therapist providing max assist). Working towards patient standing in order to pull pants up to waist. Patient left with wife at end of session.   Precautions:  Precautions Precautions: Fall;Shoulder Type of Shoulder Precautions: right shoulder subluxation  Precaution Comments:  ostomy, tachycardia, Afib, Wound vac, peg tube (doesn't wear abd binder anymore) Required Braces or Orthoses: Other Brace/Splint Other Brace/Splint: right wrist splint Restrictions Weight Bearing Restrictions: No  See FIM for current functional status  Therapy/Group: Individual Therapy  Ashle Stief 05/24/2012, 11:01 AM

## 2012-05-24 NOTE — Progress Notes (Signed)
Physical Therapy Session Note  Patient Details  Name: Connor Small MRN: [id number] Date of Birth: 1960-08-27  Today's Date: 05/24/2012 Time: 1130-1200 Time Calculation (min): 30 min  Short Term Goals: Week 2:  PT Short Term Goal 1 (Week 2): Pt will be able to perform bed mobility with mod A for supine to sit PT Short Term Goal 2 (Week 2): Pt will be able to scoot on edge of mat (using LE's to push through) with mod A to aid with slide board transfers PT Short Term Goal 3 (Week 2): Pt will be able to propel w/c with hemi technique x 50' with mod A  Skilled Therapeutic Interventions/Progress Updates:    Co-treatment with recreational therapy with focus on overall activity tolerance, standing transfers using [name] Plus, and dynamic sitting balance activity on the edge of the mat to put together pipe tree with encouragement to use RUE.   Therapy Documentation Precautions:  Precautions Precautions: Fall;Shoulder Type of Shoulder Precautions: right shoulder subluxation  Precaution Comments:  ostomy, tachycardia, Afib, Wound vac, peg tube (doesn't wear abd binder anymore) Required Braces or Orthoses: Other Brace/Splint Other Brace/Splint: right wrist splint Restrictions Weight Bearing Restrictions: No   Pain:  premedicated - reports back is still sore.  See FIM for current functional status  Therapy/Group: Individual Therapy and Co-Treatment  [name] [facility] 05/24/2012, 12:17 PM

## 2012-05-24 NOTE — Progress Notes (Signed)
Physical Therapy Weekly Progress Note  Patient Details  Name: TEMITOPE FEKETE MRN: [id number] Date of Birth: October 15, 1960  Today's Date: 05/24/2012  Patient has met 1 of 3 short term goals.  Pt making slow functional gains this week, still limited due to fatigue, pain, cognition, behavior, and significant deconditioning. Pt has demonstrated improvement with standing tolerance with lift equipment and postural control while in standing frame/Sara. Pt is also improving in LE strength and is able to actively move LE's more to aid with placing on leg rest or managing during slide board transfers. Improvement in active movement of RLE noted as well. Pt continues to require max to total A for slide board transfers and lift equipment for standing. Able to complete sit to stand with +2 assist from very elevated surface first time today.   I would recommend further rehab at [facility] level due to pt's significant medical history and severe deconditioning, but family continues to push for d/c home. Have discussed with family realistic PT goals at time of d/c here and that pt will continue to require significant amount of care. Family/wife is willing to provide this. Family is receiving daily education and will continue to become more involved in hands on training this week in preparation for d/c home.   Patient continues to demonstrate the following deficits: significant deconditioning/weakness (R > L and RUE > LUE), decreased activity tolerance, decreased balance, decreased postural control, pain, impaired cognition, decreased awareness, decreased ROM,  and therefore will continue to benefit from skilled PT intervention to enhance overall performance with activity tolerance, balance, postural control, ability to compensate for deficits, functional use of  right upper extremity and right lower extremity and awareness.  Patient progressing toward long term goals..  Continue plan of care.  PT Short Term Goals Week 2:   PT Short Term Goal 1 (Week 2): Pt will be able to perform bed mobility with mod A for supine to sit PT Short Term Goal 1 - Progress (Week 2): Progressing toward goal (mod to max A) PT Short Term Goal 2 (Week 2): Pt will be able to scoot on edge of mat (using LE's to push through) with mod A to aid with slide board transfers PT Short Term Goal 2 - Progress (Week 2): Met PT Short Term Goal 3 (Week 2): Pt will be able to propel w/c with hemi technique x 50' with mod A PT Short Term Goal 3 - Progress (Week 2): Progressing toward goal (Less than 34' with mod A) Week 3:  PT Short Term Goal 1 (Week 3): Pt will be able to maintain standing x 1 min with +2 assist to aid with postural control and LE strengthening PT Short Term Goal 2 (Week 3): Pt will be able to complete sit to stand from elevated surface 2/3 trials with max A +2 PT Short Term Goal 3 (Week 3): Pt's wife will be able to demonstrate safe slide board transfer/set-up with min verbal cues to prepare for d/c home  Skilled Therapeutic Interventions/Progress Updates:  Ambulation/gait training;Balance/vestibular training;Cognitive remediation/compensation;Discharge planning;Disease management/prevention;DME/adaptive equipment instruction;Functional electrical stimulation;Functional mobility training;Neuromuscular re-education;Pain management;Patient/family education;Psychosocial support;Skin care/wound management;Splinting/orthotics;Stair training;Therapeutic Activities;Therapeutic Exercise;UE/LE Strength taining/ROM;UE/LE Coordination activities;Wheelchair propulsion/positioning;Community reintegration   Therapy Documentation Precautions:  Precautions Precautions: Fall;Shoulder Type of Shoulder Precautions: right shoulder subluxation  Precaution Comments:  ostomy, tachycardia, Afib, Wound vac, peg tube (doesn't wear abd binder anymore) Required Braces or Orthoses: Other Brace/Splint Other Brace/Splint: right wrist splint Restrictions Weight  Bearing Restrictions: No    See FIM  for current functional status  Philip Aspen, PT, DPT 05/24/2012, 4:21 PM

## 2012-05-24 NOTE — Progress Notes (Signed)
Physical Therapy Session Note  Patient Details  Name: DETROIT FRIEDEN MRN: [id number] Date of Birth: 18-Apr-1960  Today's Date: 05/24/2012 Time: 1415-1430 (CoTreat from 1400-1430) Time Calculation (min): 15 min  Short Term Goals: Week 2:  PT Short Term Goal 1 (Week 2): Pt will be able to perform bed mobility with mod A for supine to sit PT Short Term Goal 2 (Week 2): Pt will be able to scoot on edge of mat (using LE's to push through) with mod A to aid with slide board transfers PT Short Term Goal 3 (Week 2): Pt will be able to propel w/c with hemi technique x 50' with mod A  Skilled Therapeutic Interventions/Progress Updates:    Co-Treatment with OT focusing on bilateral LE strengthening and sit<>stands using EVA walker. Patient able to perform sit<>stands with total +2 assist (therapist providing max assist) with therapist blocking knees and provided manual facilitation for upright posture. Slide board transfer to/from mat with max A with emphasis on pt initiating movement and using LUE. Patient left with wife at end of session.    Therapy Documentation Precautions:  Precautions Precautions: Fall;Shoulder Type of Shoulder Precautions: right shoulder subluxation  Precaution Comments:  ostomy, tachycardia, Afib, Wound vac, peg tube (doesn't wear abd binder anymore) Required Braces or Orthoses: Other Brace/Splint Other Brace/Splint: right wrist splint Restrictions Weight Bearing Restrictions: No  Pain:  c/o nausea - RN aware.  See FIM for current functional status  Therapy/Group: Individual Therapy and Co-Treatment with OT  [name] [facility] 05/24/2012, 3:52 PM

## 2012-05-24 NOTE — Progress Notes (Signed)
NUTRITION FOLLOW UP  DOCUMENTATION CODES  Per approved criteria   -Severe malnutrition in the context of acute illness or injury    Intervention:    Continue the following enteral nutrition regimen:  Provide: 237 mL bolus of Vital 1.5 administered over 30 minutes at 0800, 1300, 1800. Pt to receive bolus if meal intake <50%. Nocturnal continuous feedings of Vital 1.5 at 75 ml/hr x 2000 - 0600. Goal regimen will provide: 2190 kcal, 99 grams protein, 1116 ml free water.  Once pt is tolerating bolus regimen, recommend providing additional HS bolus of 237 ml of Vital 1.5 at 2200, per pt schedule. Total bolus regimen (4 x 237 ml daily) will provide: 1420 kcal, 64 grams protein, 724 ml free water. Pt would benefit from modular protein supplement of 30 ml Prostat QID. This goal regimen would provide: 1820 kcal, 124 grams protein, 724 ml free water. Provides: 83% calorie needs and 100% of protein needs. 2. RD to continue to follow nutrition care plan.  Nutrition Dx:   Inadequate oral intake related to poor appetite, early satiety, and lethargy as evidenced by poor meal completion. Ongoing.  Goal:   Intake to meet >90% of estimated nutrition needs. Met with enteral nutrition.  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 Pt with acute respiratory and renal failure with ARDS and septic shock. Pt recently discharged to CIR, however currently admitted as inpatient due to possible aspiration PNA (4/2).   Hospital course nutrition summary:  2/2 - 2/20: permissively underfed with EN  2/21-2/26: advanced to full feeds  2/26 - 3/3: feeds held for surgery  3/4 - 3/11: resumed full feeds  3/11 - 3/12: feeds held for GI bleed  3/13 - 3/24:  received full feeds  3/24: TFs discontinued diet initiated; pt with very poor oral intake; tx to rehab 3/25  3/28 - 4/1: increased n/v - pt refusing almost all meals and supplements; wife declined calorie count, requesting TPN  4/2: NGT placed to suction, surgery ordering TF initiation; tx back to acute with plans for trickle feedings.  4/3-4/4: Trickle feeds of Vital 1.2 @ 30 mL/hr  4/5: PEG placement, resume of Vital 1.2  4/6-4/9: Inconsistent TF infusion and frequent adjustment of rate. Pt has not yet reached >40 mL/hr with tolerance. Wt now 151 lbs.  4/10-11: Pt has advanced as scheduled to 60 mL/hr. Discussed RN. Plan to increase to 70 mL this afternoon.  4/14: Pt has reached goal TF rate of 75 mL/hr with tolerance. PO intake remains insufficient with improvement per pt's report.  4/15: Pt ready to begin transition to cyclic feeds. RD to order Vital 1.2 @ 85 mL/hr x21 hrs daily. Will continue with daily advancements as tolerated and shorter duration of tubefeeds.  4/16: Pt agreeable to change formula to Vital 1.5 @ 80 mL/hr x 18 hrs daily.  4/17: Transfer to CIR. Continues with order of Vital 1.5 @ 80 ml/hr x 18 hrs daily. 4/21: Initiation of 3-day calorie count; continue Vital 1.5 @ 80 ml/hr x 18 hrs daily. 4/22: Day 1 Calorie Count Results -824 kcal (37% of minimum estimated needs) and 13.5 protein (11% of minimum estimated needs) 4/25:  Pt continues cyclic regimen.  Calorie count complete.  Wife and patient informed of  results. ecreased cyclic feeds overnight and start transition to bolus feeds. Changes in regimen based on calorie count results are listed in interventions above. 4/25: Pt tolerated 80 mL bolus Vital 1.5 administered over 30 minutes at 6p.  Resume Vital 1.5 @ 80 mL/hr x 15 hrs from 7p-10a. 4/27 - 4/28: Pt with low grade fevers and n/v. Did not attempt boluses over weekend per wife. 4/29: Received 2 x 80 ml boluses at [employer]; Breakfast bolus skipped 2/2 x-ray. 4/30:  Received 2 x 160 ml boluses at [facility] D[facility]; Breakfast bolus skipped 2/2 thoracentesis  Pt received 180 ml boluses at lunch and dinner yesterday, tolerated well. Received full bolus of 237 ml today and also tolerated well per RN report.  Working towards d/c date of 5/14. Most recent temperature is 99.3 degrees F.   RD reviewed current enteral nutrition orders with RN on duty - RN denies any questions/concerns at this time.  Height: Ht Readings from Last 1 Encounters:  05/09/12 5' 10"  (1.778 m)    Weight Status:   Wt Readings from Last 1 Encounters:  05/24/12 144 lb 6.4 oz (65.499 kg)  Wt variable  Re-estimated needs:  Kcal: 2200 - 2400 Protein: 120 - 140 grams Fluid: > 2.2 liters  Skin: incisions, ileostomy, stage II pressure ulcer to R buttocks  Diet Order: General   Intake/Output Summary (Last 24 hours) at 05/24/12 0927 Last data filed at 05/24/12 0700  Gross per 24 hour  Intake   1200 ml  Output   2100 ml  Net   -900 ml    Last BM: 4/30 (1650 ml output today via ostomy yesterday)  Labs:   Recent Labs Lab 05/21/12 0500  NA 135  K 3.9  CL 99  CO2 27  BUN 18  CREATININE 0.83  CALCIUM 8.8  GLUCOSE 128*    CBG (last 3)   Recent Labs  05/23/12 1652 05/23/12 2034 05/24/12 0704  GLUCAP 101* 87 95    Scheduled Meds: . aspirin  81 mg Per Tube Daily  . chlorhexidine  15 mL Mouth Rinse BID  . clonazePAM  0.75 mg Oral QHS  . feeding supplement (VITAL 1.5 CAL)  1,000 mL Per Tube Q24H  . [START ON 05/27/2012] ferrous sulfate  300 mg Per Tube BID WC  . free water  200 mL Per Tube Q3H  . insulin aspart  0-10 Units Subcutaneous TID WC  . insulin aspart  0-5 Units Subcutaneous QHS  . iron dextran (INFED/DEXFERRUM) infusion  250 mg Intravenous Daily  . levofloxacin  750 mg Oral q1800  . lidocaine  1 patch Transdermal Q24H  . lip balm   Topical BID  . loperamide  2 mg Per Tube QHS  . loperamide  2-4 mg Per Tube UD  . metoCLOPramide  10 mg Oral TID AC  .  metoprolol tartrate  6.25 mg Per Tube Daily  . multivitamin  5 mL Per Tube Daily  . pantoprazole sodium  40 mg Per Tube Q1200  . saccharomyces boulardii  250 mg Oral BID    Continuous Infusions: none    Inda Coke MS, RD, LDN Pager: [phone] After-hours pager: [phone]

## 2012-05-24 NOTE — Progress Notes (Signed)
Recreational Therapy Session Note  Patient Details  Name: SAJID RUPPERT MRN: [id number] Date of Birth: 01/15/1961 Today's Date: 05/24/2012 Time:  1130-12 Pain: c/o back soreness, premedicated  Skilled Therapeutic Interventions/Progress Updates:  Transfer using [name] Plus to mat in gym for dynamic sitting balance EOM while working on "pipe tree" using BUE's.  Discussion with pt about interests including cooking.  Pt listed ingredients for simple meal and verbalized instructions on how to prepare.  Pt agreeable to attempt cooking activity next week.  Pt's wife & daughter present & observing sesision.   Therapy/Group: Co-Treatment   Damione Robideau 05/24/2012, 8:23 AM

## 2012-05-24 NOTE — Progress Notes (Signed)
Subjective/Complaints: Still with low grade temp. Slept better last night. Breathing better  A 12 point review of systems has been performed and if not noted above is otherwise negative.   Objective: Vital Signs: Blood pressure 107/59, pulse 118, temperature 99.3 F (37.4 C), temperature source Oral, resp. rate 18, height 5' 10"  (1.778 m), weight 65.499 kg (144 lb 6.4 oz), SpO2 96.00%. Dg Chest 2 View  05/22/2012  *RADIOLOGY REPORT*  Clinical Data: Fever and cough  CHEST - 2 VIEW  Comparison: 05/16/2012  Findings: Moderate left pleural effusion has increased from the prior study.  Hazy opacity projects throughout the left lung which may be due to the superimposed pleural fluid or asymmetric edema or infiltrate.  The right lung is clear.  The cardiac silhouette is normal in size.  No pneumothorax.  IMPRESSION: Enlarged left pleural effusion.  Possible left lung edema or infiltrates.  Clear right lung.   Original Report Authenticated By: Lajean Manes, M.D.    Dg Chest Port 1 View  05/23/2012  *RADIOLOGY REPORT*  Clinical Data: Post thoracentesis.  PORTABLE CHEST - 1 VIEW  Comparison: 05/22/2012  Findings: Decreasing left pleural effusion thought following thoracentesis.  Small to moderate left pleural effusion persists with left lower lobe atelectasis or infiltrate, also improved.  No pneumothorax.  The heart is normal size.  No confluent opacity on the right.  IMPRESSION: Decreasing left effusion and left lung airspace disease following thoracentesis.  No pneumothorax.   Original Report Authenticated By: Rolm Baptise, M.D.     Recent Labs  05/23/12 0514 05/24/12 0625  WBC 12.6* 13.6*  HGB 8.0* 8.7*  HCT 24.2* 26.9*  PLT 523* 514*   No results found for this basename: NA, K, CL, CO, GLUCOSE, BUN, CREATININE, CALCIUM,  in the last 72 hours CBG (last 3)   Recent Labs  05/23/12 1652 05/23/12 2034 05/24/12 0704  GLUCAP 101* 87 95    Wt Readings from Last 3 Encounters:  05/24/12 65.499  kg (144 lb 6.4 oz)  05/07/12 66.2 kg (145 lb 15.1 oz)  04/18/12 71.94 kg (158 lb 9.6 oz)    Physical Exam:  Constitutional: He appears well-developed. He has a sickly appearance.   HENT:  Head: Normocephalic and atraumatic.  Eyes: Pupils are equal, round, and reactive to light.  Neck: Normal range of motion.  Cardiovascular: Regular rhythm. Tachycardia present. .  Pulmonary/Chest: Effort normal. No respiratory distress.  He has no wheezes. Decreased sounds at the bases. Abdominal: He exhibits minimal distension. Bowel sounds are increased. There is tenderness.  Midline incision with retention sutures in place--dehisced incision with pink, clean wound bed.  . ---continuing to close nicely--central area still with some depth Neurological: He is alert.   Basic insight and awareness. Was able to follow basic commands. Hoarse voice. 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 UE grossly  2to 3/5 LUE, 1-2/5 RUE with contracture in Hand (limited flexion). LE 1-2/5with weakness moreso in ankle dorsiflexion. DTR's 1+. Diffuse muscle wasting.  Skin: Skin is warm and dry.  Psychiatric: relaxed  Assessment/Plan:  1. 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 Physiatrist is providing close team supervision and 24 hour management of active medical problems listed below. Physiatrist and rehab team continue to assess barriers to discharge/monitor patient progress toward functional and medical goals.    FIM: FIM - Bathing Bathing Steps Patient Completed: Chest;Right Arm;Abdomen;Front perineal area;Right upper  leg;Left upper leg Bathing: 3: Mod-Patient completes 5-7 21f10 parts or 50-74% (supine for LB in w/c for upper body)  FIM - Upper Body Dressing/Undressing Upper body dressing/undressing steps patient completed:  Thread/unthread right sleeve of pullover shirt/dresss;Thread/unthread left sleeve of pullover shirt/dress Upper body dressing/undressing: 3: Mod-Patient completed 50-74% of tasks FIM - Lower Body Dressing/Undressing Lower body dressing/undressing: 2: Max-Patient completed 25-49% of tasks  FIM - Toileting Toileting: 6: Assistive device: No helper  FIM - TAir cabin crewTransfers: 0-Activity did not occur  FIM - BControl and instrumentation engineerDevices: Sliding board;Arm rests;Bed rails Bed/Chair Transfer: 1: Mechanical lift;3: Sit > Supine: Mod A (lifting assist/Pt. 50-74%/lift 2 legs)  FIM - Locomotion: Wheelchair Locomotion: Wheelchair: 1: Total Assistance/staff pushes wheelchair (Pt<25%) FIM - Locomotion: Ambulation Ambulation/Gait Assistance: Not tested (comment) Locomotion: Ambulation: 0: Activity did not occur  Comprehension Comprehension Mode: Auditory Comprehension: 6-Follows complex conversation/direction: With extra time/assistive device  Expression Expression Mode: Verbal Expression: 5-Expresses complex 90% of the time/cues < 10% of the time  Social Interaction Social Interaction Mode: Not assessed Social Interaction: 5-Interacts appropriately 90% of the time - Needs monitoring or encouragement for participation or interaction.  Problem Solving Problem Solving Mode: Not assessed Problem Solving: 5-Solves complex 90% of the time/cues < 10% of the time  Memory Memory Mode: Not assessed Memory: 5-Requires cues to use assistive device  Medical Problem List and Plan:  1. DVT Prophylaxis/Anticoagulation: Mechanical: Sequential compression devices, below knee Bilateral lower extremities thrombocytopenia resolved.   2. Multifactorial pain: tylenol, local measures, use ultram for more severe pain, HA 3. Mood: neuropsych consult requested. Needs encouragement  -klonopin [id number] 4. Neuropsych: This patient is not capable of making decisions on  his/her own behalf.  5. FEN::   Serial labs, adjusting fluids, formula as well. Potassium slightly elevated--recheck Monday  -change to boluses at some point depending upon PO intake 6. Bilateral cardioembolic CVA: no blood thinners due to issues with bleeding 7. ABLA:  Aranesp, iv fe++--may continue aranesp another week  -iron dextran through Friday. Recheck iron studies next week  -serial cbc's-hgb 8.7 today  -no signs of overt blood loss  -continue observation and serial labs 8. 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 9. Low grade temp, leukocytosis- urine culture negative, cxr with continued increasing left effusion, +/- infiltrate  -continue levaquin  -appreciated pulmonary/ccs help. 1750 tapped from left lung yesterday, ?exudative  -IS  -?small thrombus right calf on doppler---doubt this would contribute to temp  -consider ID consult to assess for other causes of fever. CT abdomen?  LOS (Days) 15 A FACE TO FACE EVALUATION WAS PERFORMED  SWARTZ,ZACHARY T 05/24/2012 8:01 AM

## 2012-05-25 ENCOUNTER — Inpatient Hospital Stay (HOSPITAL_COMMUNITY): Payer: BC Managed Care – PPO

## 2012-05-25 ENCOUNTER — Inpatient Hospital Stay (HOSPITAL_COMMUNITY): Payer: BC Managed Care – PPO | Admitting: Occupational Therapy

## 2012-05-25 LAB — CBC WITH DIFFERENTIAL/PLATELET
Basophils Absolute: 0 10*3/uL (ref 0.0–0.1)
Lymphocytes Relative: 9 % — ABNORMAL LOW (ref 12–46)
Lymphs Abs: 1.3 10*3/uL (ref 0.7–4.0)
MCV: 88.6 fL (ref 78.0–100.0)
Neutro Abs: 11.4 10*3/uL — ABNORMAL HIGH (ref 1.7–7.7)
Neutrophils Relative %: 81 % — ABNORMAL HIGH (ref 43–77)
Platelets: 535 10*3/uL — ABNORMAL HIGH (ref 150–400)
RBC: 2.8 MIL/uL — ABNORMAL LOW (ref 4.22–5.81)
RDW: 16.5 % — ABNORMAL HIGH (ref 11.5–15.5)
WBC: 14.2 10*3/uL — ABNORMAL HIGH (ref 4.0–10.5)

## 2012-05-25 LAB — BASIC METABOLIC PANEL
Calcium: 8.7 mg/dL (ref 8.4–10.5)
GFR calc Af Amer: >90 mL/min (ref >90)
GFR calc non Af Amer: >90 mL/min (ref >90)
Potassium: 4 mEq/L (ref 3.5–5.1)
Sodium: 136 mEq/L (ref 135–145)

## 2012-05-25 LAB — CULTURE, FUNGUS WITHOUT SMEAR

## 2012-05-25 MED ORDER — FLUCONAZOLE 40 MG/ML PO SUSR: 200.0000 mg | Freq: Once | ORAL | Status: DC

## 2012-05-25 MED ORDER — NYSTATIN 100000 UNIT/ML MT SUSP: 5.0000 mL | Freq: Four times a day (QID) | OROMUCOSAL | Status: DC

## 2012-05-25 MED ORDER — FREE WATER
100.0000 mL | Status: DC
  Administered 2012-05-25 – 2012-05-30 (×38): 100 mL

## 2012-05-25 MED ORDER — FLUCONAZOLE 40 MG/ML PO SUSR: 100.0000 mg | Freq: Every day | ORAL | Status: DC

## 2012-05-25 NOTE — Progress Notes (Signed)
Occupational Therapy Session Notes  Patient Details  Name: Connor Small MRN: [id number] Date of Birth: [birth year]  Today's Date: 05/25/2012  Short Term Goals: Week 1:  OT Short Term Goal 1 (Week 1): Self Feeding:  Patient will feed himself at least 50% of 2 meals per day during this recording period. OT Short Term Goal 1 - Progress (Week 1): Progressing toward goal OT Short Term Goal 2 (Week 1): Grooming:  Patient will perform 2 grooming tasks with min assist to include set up once items needed are provided OT Short Term Goal 2 - Progress (Week 1): Progressing toward goal OT Short Term Goal 3 (Week 1): UB Dressing:  Patient will don shirt in supported sit with max assist OT Short Term Goal 3 - Progress (Week 1): Met OT Short Term Goal 4 (Week 1): Sitting balance:  Patient will sit without back support with mod assist while completing simple BADL task at least 2 days this recording period. OT Short Term Goal 4 - Progress (Week 1): Met OT Short Term Goal 5 (Week 1): UE exercises:  Patient will tolerate BUE exercises at least 4 times during this recording period. OT Short Term Goal 5 - Progress (Week 1): Met  Week 2:  OT Short Term Goal 1 (Week 2): Patient will perform grooming tasks with minimal assistance OT Short Term Goal 2 (Week 2): Patient will perform LB dressing with moderate assistance (pants only) OT Short Term Goal 3 (Week 2): Patient will be able to direct care with slide board transfer with min verbal cues OT Short Term Goal 4 (Week 2): Patient will be able to direct UE exercises with min verbal cues  Skilled Therapeutic Interventions/Progress Updates:   Session #1 1000-1100 - 60 Minutes Individual Therapy No complaints of pain Patient found supine in bed. Therapist engaged patient in RUE exercises using 1.5 wrist weight. Therapist and wife then stretched BLEs while patient supine in bed. From there patient's wife assisted to help patient sit edge of bed. While seated edge of  bed, patient able to donn shirt with supervision and was able to thread bilateral feet into pants. From there patient stood with SARA plus to pull pants up to waist. Patient request to sit down secondary to some dizziness. Once patient felt better, patient stood with SARA plus and transferred into w/c. Patient's wife positioned patient into w/c then propelled patient -> therapy gym.   Session #2 1100-1200 - 60 Minute Co-Treatment with physical therapy 1100-1130 - 30 Minute OT calculated time No complaints of pain Patient found in gym. Patient transferred onto therapy mat using slide board with mod assist, but was able to scoot with min assist. Patient then engaged in standing exercise with EVA walker; focusing on static standing balance/tolerance/endurance. Patient stood X3 with longest time being 22.5 seconds. Therapist then stretched BLEs and focused on weight bearing through right wrist/UE. After stretching, patient stood X3 with longest time being 27.5 seconds; goal was for patient to stand 30 seconds. Therapist blocked right knee during standing and held right shoulder to decrease sublux. After standing exercise, patient transferred back into chair with slide board and moderate assistance, but again patient able to scoot with min assist. Therapist encouraged patient to propel self back to room using BLEs and LUE. Wife with patient at end of session.   Precautions:  Precautions Precautions: Fall;Shoulder Type of Shoulder Precautions: right shoulder subluxation  Precaution Comments:  ostomy, tachycardia, Afib, peg tube (doesn't wear abd binder anymore) Required Braces or  Orthoses: Other Brace/Splint Other Brace/Splint: right wrist splint Restrictions Weight Bearing Restrictions: No  See FIM for current functional status  Levelle Edelen 05/25/2012, 12:13 PM

## 2012-05-25 NOTE — Progress Notes (Signed)
Physical Therapy Session Note  Patient Details  Name: Connor Small MRN: [id number] Date of Birth: 29-Dec-1960  Today's Date: 05/25/2012 Time: 1130-1158 ([place] with OT [phone]) Time Calculation (min): 28 min  Short Term Goals: Week 3:  PT Short Term Goal 1 (Week 3): Pt will be able to maintain standing x 1 min with +2 assist to aid with postural control and LE strengthening PT Short Term Goal 2 (Week 3): Pt will be able to complete sit to stand from elevated surface 2/3 trials with max A +2 PT Short Term Goal 3 (Week 3): Pt's wife will be able to demonstrate safe slide board transfer/set-up with min verbal cues to prepare for d/c home  Skilled Therapeutic Interventions/Progress Updates:    Co-treatment with OT with focus on functional slide board transfers w/c <-> mat (mod A to initiate first part over slide board and then steady A to S for scoot downhill), sit to stands from 24" elevated surface with Ethelene Hal and max A +2 multiple reps to focus on postural control in standing, balance, strengthening, and activity tolerance, and w/c propulsion using bilateral LE's and L UE with S and extra time x 50'. Wife present to observe session. Pt with improvements with all activity today and will consider upgrading long term goals next week if pt can be more consistent.  Therapy Documentation Precautions:  Precautions Precautions: Fall;Shoulder Type of Shoulder Precautions: right shoulder subluxation  Precaution Comments:  ostomy, tachycardia, Afib, peg tube (doesn't wear abd binder anymore) Required Braces or Orthoses: Other Brace/Splint Other Brace/Splint: right wrist splint Restrictions Weight Bearing Restrictions: No  Pain:  Intermittent back pain with standing - rest breaks as needed.   See FIM for current functional status  Therapy/Group: Individual Therapy and Co-Treatment with OT  [name] [facility] 05/25/2012, 12:01 PM

## 2012-05-25 NOTE — Progress Notes (Signed)
PULMONARY  / CRITICAL CARE MEDICINE  Name: Connor Small MRN: [id number] DOB: 02-23-1960    ADMISSION DATE:  05/09/2012 CONSULTATION DATE:  4/30  REFERRING MD :  Riley Kill PRIMARY SERVICE:  rehab  CHIEF COMPLAINT:  Left effusion   BRIEF PATIENT DESCRIPTION:  Connor Small admitted to [facility] for Cdiff on 2/2. Underwent decompressive cecostomy 2/3, Hospital course complicated by ARDS, PNA, septic shock, bilateral cardioembolic CVAs, renal failure, wound dehiscence and required multiple OR visits. To  rehab, aspiration event 3/29-->treated, back to rehab. PCCM asked once again to see on 4/30 for on going low gd temp,  left effusion (which has been chronic since feb 2014, but has gotten a little worse).    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 Overlying bowel gas and patientclinical status. No explanation for elevated liver function tests. Similar to slight increase in size of a perisplenic fluidcollection. Right pleural effusion  3/28 - Called back to see on rehab for ? Asp  4/30 - L thora:  F. LDH 252/ S. LDH 199 = 1.2, Protein ratio 0.57, no malignant cells, culture negative to date  LINES / TUBES:  CULTURES: UC 4/22: insig growth  4/30 Pleural fluid>>> 4/30 Pleural AFB / Fungal>>>neg prelim  ANTIBIOTICS: levaquin 4/22>>>  HISTORY OF PRESENT ILLNESS:   Connor Small admitted to [facility] for Cdiff on 2/2. Underwent decompressive cecostomy 2/3, Hospital course complicated by ARDS, PNA, septic shock, bilateral cardioembolic CVAs,renal failure, wound dehiscence and  required multiple OR visits. To [facility], aspiration event 3/29-->treated, back to rehab 4/17. 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  Has been having low grade fever since 4/22 w/ sl increase in WBCs. [name]. [name], and also some worsening of his left effusion, which has really been a chronic issue dating back to feb. [name]. PCCM asked once again to see on 4/30 for left effusion and question as to if this could be contributing to his fever.  SUBJECTIVE: Wife reports pt with some pleuritic chest pain. No distress. Tolerating therapy.    VITAL SIGNS: Temp:  [99.2 F (37.3 C)-99.4 F (37.4 C)] 99.4 F (37.4 C) (05/02 0442) Pulse Rate:  [120-125] 124 (05/02 0804) Resp:  [18] 18 (05/02 0442) BP: (92-107)/(55-69) 103/66 mmHg (05/02 0804) SpO2:  [97 %-99 %] 99 % (05/02 0442) Room air   PHYSICAL EXAMINATION: General:  Awake, oriented, chronically ill appearing  Neuro:  Oriented. Generalized weakness w/ RUE weakness. Some short term mem def  HEENT:  [name], no JVD Cardiovascular:  reg Lungs:  Decreased left  Abdomen:  Mid abd dressing intact, ostomy draining. PEG unremark Musculoskeletal:  Weak, no sig swelling    Recent Labs Lab 05/21/12 0500 05/25/12 0610  NA 135 136  K 3.9 4.0  CL 99 98  CO2 27 26  BUN 18 18  CREATININE 0.83 0.79  GLUCOSE 128* 118*    Recent Labs Lab 05/23/12 0514 05/24/12 0625 05/25/12 0610  HGB 8.0* 8.7* 8.0*  HCT 24.2* 26.9* 24.8*  WBC 12.6* 13.6* 14.2*  PLT 523* 514* 535*   Dg Chest Port 1 View  05/23/2012  *RADIOLOGY REPORT*  Clinical Data: Post thoracentesis.  PORTABLE CHEST - 1 VIEW  Comparison: 05/22/2012  Findings: Decreasing left pleural effusion thought following thoracentesis.  Small to  moderate left pleural effusion persists with left lower lobe atelectasis or infiltrate, also improved.  No pneumothorax.  The heart is normal size.  No confluent opacity on the right.  IMPRESSION: Decreasing left effusion and left lung airspace disease following thoracentesis.  No pneumothorax.   Original Report Authenticated By: Charlett Nose, M.D.    ASSESSMENT / PLAN: Left pleural effusion. Chronic since feb but somewhat bigger. Was exudate in past.  Has had persistent fevers and re-tapped 4/30 with exudative fluid, culture neg to date   Plan -cytology, GS & culture negative to date -He is on day 10 levaquin, ? Planned duration. Consider d/c -consider CT scan chest at some point; would probably benefit most if his effusion were drained dry, then get CT. No clear indication to push for this at this time  -will need outpt pulm f/u with cxr one month post d/c -assess pcxr now    [name], NP-C [facility] Pgr: [phone] or [phone]  Levy Pupa, MD, PhD 05/25/2012, 12:44 PM [facility] [phone] or if no answer [phone]

## 2012-05-25 NOTE — Progress Notes (Signed)
Physical Therapy Session Note  Patient Details  Name: Connor Small MRN: [id number] Date of Birth: January 30, 1960  Today's Date: 05/25/2012 Time: 1430-1500 Time Calculation (min): 30 min  Short Term Goals: Week 3:  PT Short Term Goal 1 (Week 3): Pt will be able to maintain standing x 1 min with +2 assist to aid with postural control and LE strengthening PT Short Term Goal 2 (Week 3): Pt will be able to complete sit to stand from elevated surface 2/3 trials with max A +2 PT Short Term Goal 3 (Week 3): Pt's wife will be able to demonstrate safe slide board transfer/set-up with min verbal cues to prepare for d/c home  Skilled Therapeutic Interventions/Progress Updates:    Family education with pt's wife on slide board transfer w/c <-> mat with emphasis on safety, proper body mechanics, and encouraging patient to do as much as possible (can do min A when he really tries). Seated in w/c completed therex to LE's on Kinetron at 15 reps x 2 sets each resistance at 65, 60, and 55.  Therapy Documentation Precautions:  Precautions Precautions: Fall;Shoulder Type of Shoulder Precautions: right shoulder subluxation  Precaution Comments:  ostomy, tachycardia, Afib, peg tube (doesn't wear abd binder anymore) Required Braces or Orthoses: Other Brace/Splint Other Brace/Splint: right wrist splint Restrictions Weight Bearing Restrictions: No  Pain: Pain Assessment Pain Assessment: 0-10 Pain Score:   5 Pain Type: Chronic pain Pain Location: Back Pain Orientation: Lower Pain Descriptors: Aching Pain Frequency: Occasional Pain Onset: Gradual Patients Stated Pain Goal: 2 Pain Intervention(s): Medication (See eMAR) Multiple Pain Sites: No  See FIM for current functional status  Therapy/Group: Individual Therapy  [name] [facility] 05/25/2012, 3:00 PM

## 2012-05-25 NOTE — Progress Notes (Signed)
NUTRITION FOLLOW UP  DOCUMENTATION CODES  Per approved criteria   -Severe malnutrition in the context of acute illness or injury    Intervention:    Continue the following enteral nutrition regimen:  Provide: 237 mL bolus of Vital 1.5 administered over 30 minutes at 0800, 1300, 1800. Pt to receive bolus if meal intake <50%. Nocturnal continuous feedings of Vital 1.5 at 75 ml/hr x 2000 - 0600. Goal regimen will provide: 2190 kcal, 99 grams protein, 1116 ml free water.  Decreased free water flushes to 100 ml every 3 hours. This will provide an additional 800 ml water daily. Monitor hydration status and adjust free water prn.  Once pt is tolerating bolus regimen, recommend providing additional HS bolus of 237 ml of Vital 1.5 at 2200, per pt schedule. Total bolus regimen (4 x 237 ml daily) will provide: 1420 kcal, 64 grams protein, 724 ml free water. Pt would benefit from modular protein supplement of 30 ml Prostat QID. This goal regimen would provide: 1820 kcal, 124 grams protein, 724 ml free water. Provides: 83% calorie needs and 100% of protein needs. 2. RD to continue to follow nutrition care plan.  Nutrition Dx:   Inadequate oral intake related to poor appetite, early satiety, and lethargy as evidenced by poor meal completion. Ongoing.  Goal:   Intake to meet >90% of estimated nutrition needs. Met with enteral nutrition.  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 Pt with acute respiratory and renal failure with ARDS and septic shock. Pt recently discharged to CIR, however currently admitted as inpatient due to possible aspiration PNA (4/2).   Hospital course nutrition summary:  2/2 - 2/20: permissively underfed with EN   2/21-2/26: advanced to full feeds  2/26 - 3/3: feeds held for surgery  3/4 - 3/11: resumed full feeds  3/11 - 3/12: feeds held for GI bleed  3/13 - 3/24: received full feeds  3/24: TFs discontinued diet initiated; pt with very poor oral intake; tx to rehab 3/25  3/28 - 4/1: increased n/v - pt refusing almost all meals and supplements; wife declined calorie count, requesting TPN  4/2: NGT placed to suction, surgery ordering TF initiation; tx back to acute with plans for trickle feedings.  4/3-4/4: Trickle feeds of Vital 1.2 @ 30 mL/hr  4/5: PEG placement, resume of Vital 1.2  4/6-4/9: Inconsistent TF infusion and frequent adjustment of rate. Pt has not yet reached >40 mL/hr with tolerance. Wt now 151 lbs.  4/10-11: Pt has advanced as scheduled to 60 mL/hr. Discussed RN. Plan to increase to 70 mL this afternoon.  4/14: Pt has reached goal TF rate of 75 mL/hr with tolerance. PO intake remains insufficient with improvement per pt's report.  4/15: Pt ready to begin transition to cyclic feeds. RD to order Vital 1.2 @ 85 mL/hr x21 hrs daily. Will continue with daily advancements as tolerated and shorter duration of tubefeeds.  4/16: Pt agreeable to change formula to Vital 1.5 @ 80 mL/hr x 18 hrs daily.  4/17: Transfer to CIR. Continues with order of Vital 1.5 @ 80 ml/hr x 18 hrs daily. 4/21: Initiation of 3-day calorie count; continue Vital 1.5 @ 80 ml/hr x 18 hrs daily. 4/22: Day 1 Calorie Count Results -824 kcal (37%  of minimum estimated needs) and 13.5 protein (11% of minimum estimated needs) 4/25:  Pt continues cyclic regimen.  Calorie count complete.  Wife and patient informed of results. ecreased cyclic feeds overnight and start transition to bolus feeds. Changes in regimen based on calorie count results are listed in interventions above. 4/25: Pt tolerated 80 mL bolus Vital 1.5 administered over 30 minutes at 6p.  Resume Vital 1.5 @ 80 mL/hr x 15 hrs from 7p-10a. 4/27 - 4/28: Pt with low grade  fevers and n/v. Did not attempt boluses over weekend per wife. 4/29: Received 2 x 80 ml boluses at [employer]; Breakfast bolus skipped 2/2 x-ray. 4/30: Received 2 x 160 ml boluses at [employer]; Breakfast bolus skipped 2/2 thoracentesis. 5/1: Received 3 x 237 ml boluses at [facility], L[facility], and [employer]  Working towards d/c date of 5/14. Most recent temperature is 99.4 degrees F.   Pt receiving 237 mL bolus of Vital 1.5 administered over 30 minutes at 0800, 1300, 1800. Pt to receive bolus if meal intake <50%. Nocturnal continuous feedings of Vital 1.5 at 75 ml/hr x 2000 - 0600. Goal regimen provides: 2190 kcal, 99 grams protein, 1116 ml free water. Free water flushes of 200 ml q 3 hours. This provides an additional 1600 ml free water. Wife reports that pt is drinking at least 4-8 oz at each meal. Will decrease free water flushes to 100 ml q 3 hours and monitor hydration status accordingly.  RD reviewed current enteral nutrition orders with RN on duty - RN denies any questions/concerns at this time.  Height: Ht Readings from Last 1 Encounters:  05/09/12 5' 10"  (1.778 m)    Weight Status:   Wt Readings from Last 1 Encounters:  05/24/12 144 lb 6.4 oz (65.499 kg)  Wt variable  Re-estimated needs:  Kcal: 2200 - 2400 Protein: 120 - 140 grams Fluid: > 2.2 liters  Skin: incisions, ileostomy, stage II pressure ulcer to R buttocks  Diet Order: General   Intake/Output Summary (Last 24 hours) at 05/25/12 0819 Last data filed at 05/24/12 2000  Gross per 24 hour  Intake    240 ml  Output    900 ml  Net   -660 ml    Last BM: 5/1 (550 ml output today via ostomy yesterday)  Labs:   Recent Labs Lab 05/21/12 0500 05/25/12 0610  NA 135 136  K 3.9 4.0  CL 99 98  CO2 27 26  BUN 18 18  CREATININE 0.83 0.79  CALCIUM 8.8 8.7  GLUCOSE 128* 118*    CBG (last 3)   Recent Labs  05/24/12 1653 05/24/12 2052 05/25/12 0720  GLUCAP 79 126* 84    Scheduled Meds: . aspirin   81 mg Per Tube Daily  . chlorhexidine  15 mL Mouth Rinse BID  . clonazePAM  0.75 mg Oral QHS  . feeding supplement (VITAL 1.5 CAL)  1,000 mL Per Tube Q24H  . feeding supplement (VITAL 1.5 CAL)  237 mL Per Tube Custom  . [START ON 05/27/2012] ferrous sulfate  300 mg Per Tube BID WC  . free water  200 mL Per Tube Q3H  . insulin aspart  0-10 Units Subcutaneous TID WC  . insulin aspart  0-5 Units Subcutaneous QHS  . levofloxacin  750 mg Oral q1800  . lidocaine  1 patch Transdermal Q24H  . lip balm   Topical BID  . loperamide  2 mg Per Tube QHS  . loperamide  2-4 mg Per  Tube UD  . metoCLOPramide  10 mg Oral TID AC  . metoprolol tartrate  6.25 mg Per Tube Daily  . multivitamin  5 mL Per Tube Daily  . pantoprazole sodium  40 mg Per Tube Q1200  . saccharomyces boulardii  250 mg Oral BID    Continuous Infusions: none    Inda Coke MS, RD, LDN Pager: [phone] After-hours pager: [phone]

## 2012-05-25 NOTE — Progress Notes (Addendum)
Subjective/Complaints: Persistent low grade temp. Slept fairly well.  A 12 point review of systems has been performed and if not noted above is otherwise negative.   Objective: Vital Signs: Blood pressure 107/55, pulse 120, temperature 99.4 F (37.4 C), temperature source Oral, resp. rate 18, height 5' 10"  (1.778 m), weight 65.499 kg (144 lb 6.4 oz), SpO2 99.00%. Dg Chest Port 1 View  05/23/2012  *RADIOLOGY REPORT*  Clinical Data: Post thoracentesis.  PORTABLE CHEST - 1 VIEW  Comparison: 05/22/2012  Findings: Decreasing left pleural effusion thought following thoracentesis.  Small to moderate left pleural effusion persists with left lower lobe atelectasis or infiltrate, also improved.  No pneumothorax.  The heart is normal size.  No confluent opacity on the right.  IMPRESSION: Decreasing left effusion and left lung airspace disease following thoracentesis.  No pneumothorax.   Original Report Authenticated By: Rolm Baptise, M.D.     Recent Labs  05/24/12 0625 05/25/12 0610  WBC 13.6* 14.2*  HGB 8.7* 8.0*  HCT 26.9* 24.8*  PLT 514* 535*    Recent Labs  05/25/12 0610  NA 136  K 4.0  CL 98  GLUCOSE 118*  BUN 18  CREATININE 0.79  CALCIUM 8.7   CBG (last 3)   Recent Labs  05/24/12 1653 05/24/12 2052 05/25/12 0720  GLUCAP 79 126* 84    Wt Readings from Last 3 Encounters:  05/24/12 65.499 kg (144 lb 6.4 oz)  05/07/12 66.2 kg (145 lb 15.1 oz)  04/18/12 71.94 kg (158 lb 9.6 oz)    Physical Exam:  Constitutional: He appears well-developed. He has a sickly appearance.   HENT:  Head: Normocephalic and atraumatic.  Eyes: Pupils are equal, round, and reactive to light.  Neck: Normal range of motion.  Cardiovascular: Regular rhythm. Tachycardia present. .  Pulmonary/Chest: Effort normal. No respiratory distress.  He has no wheezes. Decreased sounds at the bases. Abdominal: He exhibits minimal distension. Bowel sounds are increased. There is tenderness.  Midline incision  with retention sutures in place--dehisced incision with pink, clean wound bed which is closing.  . -  Neurological: He is alert.   Basic insight and awareness. Was able to follow basic commands. Hoarse voice. 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 UE grossly  2to 3/5 LUE, 1-2/5 RUE with contracture in Hand (limited flexion). LE 1-2/5with weakness more in ankle dorsiflexion. DTR's 1+. Diffuse muscle wasting.  Skin: Skin is warm and dry.  Psychiatric: relaxed  Assessment/Plan:  1. 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 Physiatrist is providing close team supervision and 24 hour management of active medical problems listed below. Physiatrist and rehab team continue to assess barriers to discharge/monitor patient progress toward functional and medical goals.    FIM: FIM - Bathing Bathing Steps Patient Completed: Chest;Right Arm;Abdomen;Front perineal area;Right upper leg;Left upper leg Bathing: 3: Mod-Patient completes 5-7 73f10 parts or 50-74% (supine for LB in w/c for upper body)  FIM - Upper Body Dressing/Undressing Upper body dressing/undressing steps patient completed: Thread/unthread right sleeve of pullover shirt/dresss;Thread/unthread left sleeve of pullover shirt/dress Upper body dressing/undressing: 3: Mod-Patient completed 50-74% of tasks FIM - Lower Body Dressing/Undressing Lower body dressing/undressing: 1: Total-Patient completed less than 25% of tasks  FIM - Toileting Toileting: 6: Assistive device: No helper  FIM - TAir cabin crewTransfers: 0-Activity did not occur  FIM - BControl and instrumentation engineerDevices: Sliding board;Arm rests;Bed  rails Bed/Chair Transfer: 2: Chair or W/C > Bed: Max A (lift and lower assist);3: Supine > Sit: Mod A (lifting assist/Pt.  50-74%/lift 2 legs  FIM - Locomotion: Wheelchair Locomotion: Wheelchair: 1: Total Assistance/staff pushes wheelchair (Pt<25%) FIM - Locomotion: Ambulation Ambulation/Gait Assistance: Not tested (comment) Locomotion: Ambulation: 0: Activity did not occur  Comprehension Comprehension Mode: Auditory Comprehension: 6-Follows complex conversation/direction: With extra time/assistive device  Expression Expression Mode: Verbal Expression: 6-Expresses complex ideas: With extra time/assistive device  Social Interaction Social Interaction Mode: Not assessed Social Interaction: 5-Interacts appropriately 90% of the time - Needs monitoring or encouragement for participation or interaction.  Problem Solving Problem Solving Mode: Not assessed Problem Solving: 5-Solves basic problems: With no assist  Memory Memory Mode: Not assessed Memory: 5-Recognizes or recalls 90% of the time/requires cueing < 10% of the time  Medical Problem List and Plan:  1. DVT Prophylaxis/Anticoagulation: Mechanical: Sequential compression devices, below knee Bilateral lower extremities thrombocytopenia resolved.   2. Multifactorial pain: tylenol, local measures, use ultram for more severe pain, HA 3. Mood: neuropsych consult requested. Needs encouragement  -klonopin 0.33m qhs 4. Neuropsych: This patient is not capable of making decisions on his/her own behalf.  5. FEN::   Serial labs, adjusting fluids, formula as well. Potassium slightly elevated--recheck Monday  -bolus feed trial, with po attempts as well 6. Bilateral cardioembolic CVA: no blood thinners due to issues with bleeding 7. ABLA:  Aranesp, iv fe++--may continue aranesp another week  -iron dextran through Friday. Recheck iron studies next week  -serial cbc's-hgb 8.7 today  -no signs of overt blood loss  -continue observation and serial labs 8. 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 9. Low grade temp/leukocytosis- urine culture negative, s/p tap of pleural effusion  -WBC's slowly increasing  -continue levaquin day 10  -appreciated pulmonary/ccs help. Culture pending of pleural fluid  -IS  -?small thrombus right calf on doppler---doubt this would contribute to temp  -consider ID consult to assess for other causes of fever. CT abdomen/chest?  LOS (Days) 16 A FACE TO FACE EVALUATION WAS PERFORMED  SWARTZ,ZACHARY T 05/25/2012 7:37 AM

## 2012-05-26 ENCOUNTER — Inpatient Hospital Stay (HOSPITAL_COMMUNITY): Payer: BC Managed Care – PPO | Admitting: Occupational Therapy

## 2012-05-26 ENCOUNTER — Inpatient Hospital Stay (HOSPITAL_COMMUNITY): Payer: BC Managed Care – PPO

## 2012-05-26 DIAGNOSIS — G8250 Quadriplegia, unspecified: Secondary | ICD-10-CM

## 2012-05-26 DIAGNOSIS — D62 Acute posthemorrhagic anemia: Secondary | ICD-10-CM

## 2012-05-26 DIAGNOSIS — G7281 Critical illness myopathy: Secondary | ICD-10-CM

## 2012-05-26 DIAGNOSIS — J90 Pleural effusion, not elsewhere classified: Secondary | ICD-10-CM

## 2012-05-26 LAB — GLUCOSE, CAPILLARY: Glucose-Capillary: 99 mg/dL (ref 70–99)

## 2012-05-26 LAB — BODY FLUID CULTURE: Special Requests: NORMAL

## 2012-05-26 NOTE — Progress Notes (Signed)
Occupational Therapy Session Note  Patient Details  Name: Connor Small MRN: [id number] Date of Birth: [birth year]  Today's Date: 05/26/2012 Time: 4174-0814 Time Calculation (min): 42 min  Short Term Goals: No short term goals set  Skilled Therapeutic Interventions: Therapeutic activities with emphasis on transfers (sliding board, stand-pivot), general conditioning, functional use of R-UE.   Following Max-assist stand-pivot transfer from w/c to NuStep, patient performed 3 minutes of NuStep, level 1, arm position 9, leg position 12 for a total distance of 191 steps.   Initial goal was to tolerate 5 minutes however patient fatigued at 3 minutes.  Patient rated exertion as "13" on Borg scale after 3 min of NuStep.   Patient completed sliding board transfer from NuStep to w/c with moderate assist and verbal cues to use LE to assist with stabilizing trunk during transfer.   Patient also required a total of 3 rest breaks of approx 2-3 minutes following static stand, after use of NuStep, and again after sliding board transfer.   Stand-pivot transfer was selected after attempted squat-pivot to NuStep failed due to patient's inability to generate LE strength sufficiently to tolerate dynamic squat position.     Therapy Documentation Precautions:  Precautions Precautions: Fall;Shoulder Type of Shoulder Precautions: right shoulder subluxation  Precaution Comments:  ostomy, tachycardia, Afib, peg tube (doesn't wear abd binder anymore) Required Braces or Orthoses: Other Brace/Splint Other Brace/Splint: right wrist splint Restrictions Weight Bearing Restrictions: No  Pain: Pain Assessment Pain Assessment: No/denies pain  See FIM for current functional status  Therapy/Group: Individual Therapy  Maryan Puls 05/26/2012, 1:43 PM

## 2012-05-26 NOTE — Progress Notes (Signed)
Patient ID: Connor Small, male   DOB: 25-Jul-1960, 52 y.o.   MRN: [id number] Subjective/Complaints: Persistent low grade temp. Slept fairly well.  A 12 point review of systems has been performed and if not noted above is otherwise negative.   Objective: Vital Signs: Blood pressure 100/64, pulse 114, temperature 98.4 F (36.9 C), temperature source Oral, resp. rate 18, height 5\' 10"  (1.778 m), weight 62.7 kg (138 lb 3.7 oz), SpO2 97.00%. Dg Chest Port 1 View  05/25/2012  *RADIOLOGY REPORT*  Clinical Data: Fever.  PORTABLE CHEST - 1 VIEW  Comparison: 05/23/2012.  Findings: Trachea is midline.  Heart size stable.  There is left basilar airspace consolidation with a small left pleural effusion, which appears partially loculated.  Minimal right basilar airspace disease is seen medially.  IMPRESSION:  1.  Due to technical issues, today's examination is date stamped on the PACS timeline  as 04/07/2012.  However, it was performed today, 05/25/2012, at 0926 hours.  The error is being addressed and should be resolved within 24 hours. 2.  Left basilar airspace consolidation and small left pleural effusion, which appears partially loculated.  Findings are suspicious for pneumonia.  Associated empyema cannot be excluded.   Original Report Authenticated By: Leanna Battles, M.D.     Recent Labs  05/24/12 0625 05/25/12 0610  WBC 13.6* 14.2*  HGB 8.7* 8.0*  HCT 26.9* 24.8*  PLT 514* 535*    Recent Labs  05/25/12 0610  NA 136  K 4.0  CL 98  GLUCOSE 118*  BUN 18  CREATININE 0.79  CALCIUM 8.7   CBG (last 3)   Recent Labs  05/25/12 0720 05/25/12 1206 05/25/12 2143  GLUCAP 84 80 79    Wt Readings from Last 3 Encounters:  05/26/12 62.7 kg (138 lb 3.7 oz)  05/07/12 66.2 kg (145 lb 15.1 oz)  04/18/12 71.94 kg (158 lb 9.6 oz)    Physical Exam:  Constitutional: He appears well-developed. He has a sickly appearance.   HENT:  Head: Normocephalic and atraumatic.  Eyes: Pupils are equal,  round, and reactive to light.  Neck: Normal range of motion.  Cardiovascular: Regular rhythm. Tachycardia present. .  Pulmonary/Chest: Effort normal. No respiratory distress.  He has no wheezes. Decreased sounds at the bases. Abdominal: He exhibits minimal distension. Bowel sounds are increased. There is tenderness.  Midline incision with retention sutures in place--dehisced incision with pink, clean wound bed which is closing.  . -  Neurological: He is alert.   Basic insight and awareness. Was able to follow basic commands. Hoarse voice. 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 UE grossly  2to 3/5 LUE, 2-3/5 RUE with contracture in Hand (limited flexion). LE 1-2/5with weakness more in ankle dorsiflexion. DTR's 1+. Diffuse muscle wasting.  Skin: Skin is warm and dry.  Psychiatric: relaxed  Assessment/Plan:  1. 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 Physiatrist is providing close team supervision and 24 hour management of active medical problems listed below. Physiatrist and rehab team continue to assess barriers to discharge/monitor patient progress toward functional and medical goals.    FIM: FIM - Bathing Bathing Steps Patient Completed: Chest;Right Arm;Abdomen;Front perineal area;Right upper leg;Left upper leg Bathing: 3: Mod-Patient completes 5-7 38f 10 parts or 50-74% (supine for LB in w/c for upper body)  FIM - Upper Body Dressing/Undressing Upper body dressing/undressing steps patient completed: Thread/unthread right sleeve of pullover  shirt/dresss;Thread/unthread left sleeve of pullover shirt/dress;Put head through opening of pull over shirt/dress;Pull shirt over trunk Upper body dressing/undressing: 5: Supervision: Safety issues/verbal cues FIM - Lower Body Dressing/Undressing Lower body  dressing/undressing steps patient completed: Thread/unthread right pants leg;Thread/unthread left pants leg Lower body dressing/undressing: 2: Max-Patient completed 25-49% of tasks  FIM - Toileting Toileting: 6: Assistive device: No helper  FIM - Archivist Transfers: 0-Activity did not occur  FIM - Banker Devices: Sliding board;Arm rests;Bed rails Bed/Chair Transfer: 2: Chair or W/C > Bed: Max A (lift and lower assist);3: Supine > Sit: Mod A (lifting assist/Pt. 50-74%/lift 2 legs  FIM - Locomotion: Wheelchair Locomotion: Wheelchair: 2: Travels 50 - 149 ft with supervision, cueing or coaxing FIM - Locomotion: Ambulation Ambulation/Gait Assistance: Not tested (comment) Locomotion: Ambulation: 0: Activity did not occur  Comprehension Comprehension Mode: Auditory Comprehension: 6-Follows complex conversation/direction: With extra time/assistive device  Expression Expression Mode: Verbal Expression: 5-Expresses complex 90% of the time/cues < 10% of the time  Social Interaction Social Interaction Mode: Not assessed Social Interaction: 5-Interacts appropriately 90% of the time - Needs monitoring or encouragement for participation or interaction.  Problem Solving Problem Solving Mode: Not assessed Problem Solving: 5-Solves complex 90% of the time/cues < 10% of the time  Memory Memory Mode: Not assessed Memory: 5-Requires cues to use assistive device  Medical Problem List and Plan:  1. DVT Prophylaxis/Anticoagulation: Mechanical: Sequential compression devices, below knee Bilateral lower extremities thrombocytopenia resolved.   2. Multifactorial pain: tylenol, local measures, use ultram for more severe pain, HA 3. Mood: neuropsych consult requested. Needs encouragement  -klonopin 0.75mg  qhs 4. Neuropsych: This patient is not capable of making decisions on his/her own behalf.  5. FEN::   Serial labs, adjusting fluids, formula  as well. Potassium slightly elevated--recheck Monday  -bolus feed trial, with po attempts as well 6. Bilateral cardioembolic CVA: no blood thinners due to issues with bleeding 7. ABLA:  Aranesp, iv fe++--may continue aranesp another week  -iron dextran through Friday. Recheck iron studies next week  -serial cbc's-hgb 8.7 today  -no signs of overt blood loss  -continue observation and serial labs 8. 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 9. Low grade temp/leukocytosis- urine culture negative, s/p tap of pleural effusion  -WBC's slowly increasing  -continue levaquin day 11  -appreciated pulmonary/ccs help. Culture  of pleural fluid neg  -IS  -?small thrombus right calf on doppler---doubt this would contribute to temp  -consider ID consult to assess for other causes of fever. CT chest rec per pulm, ? timing  LOS (Days) 17 A FACE TO FACE EVALUATION WAS PERFORMED  Erick Colace 05/26/2012 6:57 AM

## 2012-05-26 NOTE — Progress Notes (Signed)
Occupational Therapy Session Note  Patient Details  Name: Connor Small MRN: [id number] Date of Birth: 1960-08-15  Today's Date: 05/26/2012 Time: 1400-1500 Time Calculation (min): 60 min  Skilled Therapeutic Interventions/Progress Updates: Rehab tech changed out w/c as patient and wife stated breaks did not work on chair.  Then per tech  Report, wife assisted tech with sliding board transfers (Max A x 2?).  Otherwise, patient participated in [place] and tolerated session well.     Therapy Documentation Precautions:  Precautions Precautions: Fall;Shoulder Type of Shoulder Precautions: right shoulder subluxation  Precaution Comments:  ostomy, tachycardia, Afib, peg tube (doesn't wear abd binder anymore) Required Braces or Orthoses: Other Brace/Splint Other Brace/Splint: right wrist splint Restrictions Weight Bearing Restrictions: No  Pain:denied  See FIM for current functional status  Therapy/Group: Group Therapy  Herschell Dimes 05/26/2012, 4:29 PM

## 2012-05-27 ENCOUNTER — Inpatient Hospital Stay (HOSPITAL_COMMUNITY): Payer: BC Managed Care – PPO | Admitting: Physical Therapy

## 2012-05-27 ENCOUNTER — Inpatient Hospital Stay (HOSPITAL_COMMUNITY): Payer: BC Managed Care – PPO | Admitting: *Deleted

## 2012-05-27 NOTE — Progress Notes (Signed)
Occupational Therapy Note  Patient Details  Name: CONCEPCION KIRKPATRICK MRN: [id number] Date of Birth: 02/07/1960 Today's Date: 05/27/2012 Pain Back 5/10 as pt increased time with sitting Time:  1400-1500  (60 min) Individual session  Engaged in transfer training with caregiver Estill Bamberg and [place])..  Addressed sitting balance, standing tolerance, BUE PROm, BUE AROm.  Stood with eva walker for 1 min, rest, 30 sec rest, unable on 3rd attempt.  Did UE exercises with L stronger than right.  Had Wife practice sliding board transfers from wc to bed with mod instructional cues for board placement and scooting.     Lisa Roca 05/27/2012, 3:02 PM

## 2012-05-27 NOTE — Progress Notes (Signed)
Patient ID: Connor Small, male   DOB: 06/08/1960, 52 y.o.   MRN: [id number]   Patient ID: Connor Small, male   DOB: 13-Nov-1960, 52 y.o.   MRN: [id number] Subjective/Complaints: Persistent low grade temp. Slept fairly well.  A 12 point review of systems has been performed and if not noted above is otherwise negative.   Objective: Vital Signs: Blood pressure 100/64, pulse 114, temperature 98.4 F (36.9 C), temperature source Oral, resp. rate 18, height 5\' 10"  (1.778 m), weight 62.7 kg (138 lb 3.7 oz), SpO2 97.00%. Dg Chest Port 1 View  05/25/2012  *RADIOLOGY REPORT*  Clinical Data: Fever.  PORTABLE CHEST - 1 VIEW  Comparison: 05/23/2012.  Findings: Trachea is midline.  Heart size stable.  There is left basilar airspace consolidation with a small left pleural effusion, which appears partially loculated.  Minimal right basilar airspace disease is seen medially.  IMPRESSION:  1.  Due to technical issues, today's examination is date stamped on the PACS timeline  as 04/07/2012.  However, it was performed today, 05/25/2012, at 0926 hours.  The error is being addressed and should be resolved within 24 hours. 2.  Left basilar airspace consolidation and small left pleural effusion, which appears partially loculated.  Findings are suspicious for pneumonia.  Associated empyema cannot be excluded.   Original Report Authenticated By: Leanna Battles, M.D.     Recent Labs  05/24/12 0625 05/25/12 0610  WBC 13.6* 14.2*  HGB 8.7* 8.0*  HCT 26.9* 24.8*  PLT 514* 535*    Recent Labs  05/25/12 0610  NA 136  K 4.0  CL 98  GLUCOSE 118*  BUN 18  CREATININE 0.79  CALCIUM 8.7   CBG (last 3)   Recent Labs  05/25/12 0720 05/25/12 1206 05/25/12 2143  GLUCAP 84 80 79    Wt Readings from Last 3 Encounters:  05/26/12 62.7 kg (138 lb 3.7 oz)  05/07/12 66.2 kg (145 lb 15.1 oz)  04/18/12 71.94 kg (158 lb 9.6 oz)    Physical Exam:  Constitutional: He appears well-developed. He has a sickly appearance.    HENT:  Head: Normocephalic and atraumatic.  Eyes: Pupils are equal, round, and reactive to light.  Neck: Normal range of motion.  Cardiovascular: Regular rhythm. Tachycardia present. .  Pulmonary/Chest: Effort normal. No respiratory distress.  He has no wheezes. Decreased sounds at the bases. Abdominal: He exhibits minimal distension. Bowel sounds are increased. There is tenderness.  Midline incision with retention sutures in place--dehisced incision with pink, clean wound bed which is closing.  . -  Neurological: He is alert.   Basic insight and awareness. Was able to follow basic commands. Hoarse voice. 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 UE grossly  2to 3/5 LUE, 2-3/5 RUE with contracture in Hand (limited flexion). LE 1-2/5with weakness more in ankle dorsiflexion. DTR's 1+. Diffuse muscle wasting.  Skin: Skin is warm and dry.  Psychiatric: relaxed  Assessment/Plan:  1. 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 Physiatrist is providing close team supervision and 24 hour management of active medical problems listed below. Physiatrist and rehab team continue to assess barriers to discharge/monitor patient progress toward functional and medical goals.    FIM: FIM - Bathing Bathing Steps Patient Completed: Chest;Right Arm;Abdomen;Front perineal area;Right upper leg;Left upper leg Bathing: 3: Mod-Patient completes 5-7 30f 10 parts or 50-74% (supine for LB in w/c for upper  body)  FIM - Upper Body Dressing/Undressing Upper body dressing/undressing steps patient completed: Thread/unthread right sleeve of pullover shirt/dresss;Thread/unthread left sleeve of pullover shirt/dress;Put head through opening of pull over shirt/dress;Pull shirt over trunk Upper body dressing/undressing: 5: Supervision: Safety  issues/verbal cues FIM - Lower Body Dressing/Undressing Lower body dressing/undressing steps patient completed: Thread/unthread right pants leg;Thread/unthread left pants leg Lower body dressing/undressing: 2: Max-Patient completed 25-49% of tasks  FIM - Toileting Toileting: 6: Assistive device: No helper  FIM - Archivist Transfers: 0-Activity did not occur  FIM - Banker Devices: Sliding board;Arm rests;Bed rails Bed/Chair Transfer: 2: Chair or W/C > Bed: Max A (lift and lower assist);3: Supine > Sit: Mod A (lifting assist/Pt. 50-74%/lift 2 legs  FIM - Locomotion: Wheelchair Locomotion: Wheelchair: 2: Travels 50 - 149 ft with supervision, cueing or coaxing FIM - Locomotion: Ambulation Ambulation/Gait Assistance: Not tested (comment) Locomotion: Ambulation: 0: Activity did not occur  Comprehension Comprehension Mode: Auditory Comprehension: 6-Follows complex conversation/direction: With extra time/assistive device  Expression Expression Mode: Verbal Expression: 5-Expresses complex 90% of the time/cues < 10% of the time  Social Interaction Social Interaction Mode: Not assessed Social Interaction: 5-Interacts appropriately 90% of the time - Needs monitoring or encouragement for participation or interaction.  Problem Solving Problem Solving Mode: Not assessed Problem Solving: 5-Solves complex 90% of the time/cues < 10% of the time  Memory Memory Mode: Not assessed Memory: 5-Requires cues to use assistive device  Medical Problem List and Plan:  1. DVT Prophylaxis/Anticoagulation: Mechanical: Sequential compression devices, below knee Bilateral lower extremities thrombocytopenia resolved.   2. Multifactorial pain: tylenol, local measures, use ultram for more severe pain, HA 3. Mood: neuropsych consult requested. Needs encouragement  -klonopin 0.75mg  qhs 4. Neuropsych: This patient is not capable of making decisions on  his/her own behalf.  5. FEN::   Serial labs, adjusting fluids, formula as well. Potassium slightly elevated--recheck Monday  -bolus feed trial, with po attempts as well, ate well for lunch and dinner, stop nite feeds               Also stop insulin and CBGs, values have been normal for several days 6. Bilateral cardioembolic CVA: no blood thinners due to issues with bleeding 7. ABLA:  Aranesp, iv fe++--may continue aranesp another week  -iron dextran through Friday. Recheck iron studies next week  -serial cbc's-hgb 8.7 today  -no signs of overt blood loss  -continue observation and serial labs 8. 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 9. Low grade temp/leukocytosis- urine culture negative, s/p tap of pleural effusion  -WBC's slowly increasing, recheck in am  -continue levaquin day 11  -appreciated pulmonary/ccs help. Culture  of pleural fluid neg, stop Levaquin and monitor  -IS  -?small thrombus right calf on doppler---doubt this would contribute to temp  -consider ID consult to assess for other causes of fever. CT chest rec per pulm, ? timing  LOS (Days) 17 A FACE TO FACE EVALUATION WAS PERFORMED  Erick Colace 05/26/2012 6:57 AM

## 2012-05-27 NOTE — Progress Notes (Signed)
Patient eating about 50 percent or more of meals today. Patient has not needed the boluses. Patient wife requesting patient to get a bolus late since patient will not be getting night feeding. Dr Letta Pate notified. Noted ok to given 1800 bolus later. Patient gastric residuals 0, 5, and 0. Patient pain managed with Tramadol at 1400 and tylenol at 1506. No other issues noted. Continue with plan of care.

## 2012-05-27 NOTE — Progress Notes (Signed)
Physical Therapy Note  Patient Details  Name: MAUI BRITTEN MRN: [id number] Date of Birth: 30-Sep-1960 Today's Date: 05/27/2012  1100-1200 (60 minutes) individual Pain: no reported pain Focus of treatment: Caregiver education (transfers, therapeutic exercises); Neuro re-ed bilateral LEs Treatment: Pt in bed upon arrival; wife assisted pt from supine to left side to sit mod/max assist; wife assisted pt with sliding board transfer mod/max assist with min vcs for technique; squat/pivot transfer to mat max assist ; sit to supine (mat) max assist; Neuro re-ed bilateral LEs X 20 using therapy ball in supine- hip flexion /extension with minimal manual resistance; trunk rotation, hamstring curls; ball squeezes for adductors; ankle pumps; wife transferred pt back to wc with +2 assist of daughter safely using sliding board. Pt tolerated session without difficulties.    Sigfredo Schreier,JIM 05/27/2012, 12:01 PM

## 2012-05-27 NOTE — Progress Notes (Signed)
Complained of pain to right shoulder, PRN ultram given at 2223. At 0111 patient continued to complain of right shoulder pain, PRN tylenol given. Repositioning every 2-3 hours. Residual checked from PEG=34m. MPatrici RanksA

## 2012-05-28 ENCOUNTER — Inpatient Hospital Stay (HOSPITAL_COMMUNITY): Payer: BC Managed Care – PPO

## 2012-05-28 ENCOUNTER — Inpatient Hospital Stay (HOSPITAL_COMMUNITY): Payer: BC Managed Care – PPO | Admitting: Speech Pathology

## 2012-05-28 ENCOUNTER — Inpatient Hospital Stay (HOSPITAL_COMMUNITY): Payer: BC Managed Care – PPO | Admitting: Occupational Therapy

## 2012-05-28 ENCOUNTER — Encounter (HOSPITAL_COMMUNITY): Payer: Self-pay | Admitting: Radiology

## 2012-05-28 DIAGNOSIS — R509 Fever, unspecified: Secondary | ICD-10-CM

## 2012-05-28 DIAGNOSIS — G7281 Critical illness myopathy: Secondary | ICD-10-CM

## 2012-05-28 DIAGNOSIS — D62 Acute posthemorrhagic anemia: Secondary | ICD-10-CM

## 2012-05-28 DIAGNOSIS — R188 Other ascites: Secondary | ICD-10-CM

## 2012-05-28 DIAGNOSIS — J90 Pleural effusion, not elsewhere classified: Secondary | ICD-10-CM

## 2012-05-28 DIAGNOSIS — D72829 Elevated white blood cell count, unspecified: Secondary | ICD-10-CM

## 2012-05-28 DIAGNOSIS — G8250 Quadriplegia, unspecified: Secondary | ICD-10-CM

## 2012-05-28 LAB — COMPREHENSIVE METABOLIC PANEL
ALT: 13 U/L (ref 0–53)
Alkaline Phosphatase: 125 U/L — ABNORMAL HIGH (ref 39–117)
BUN: 20 mg/dL (ref 6–23)
CO2: 26 mEq/L (ref 19–32)
Calcium: 9.2 mg/dL (ref 8.4–10.5)
GFR calc Af Amer: >90 mL/min (ref >90)
GFR calc non Af Amer: >90 mL/min (ref >90)
Glucose, Bld: 99 mg/dL (ref 70–99)
Potassium: 4.5 mEq/L (ref 3.5–5.1)
Sodium: 137 mEq/L (ref 135–145)
Total Protein: 6 g/dL (ref 6.0–8.3)

## 2012-05-28 LAB — CBC WITH DIFFERENTIAL/PLATELET
Basophils Absolute: 0.1 10*3/uL (ref 0.0–0.1)
Basophils Relative: 0 % (ref 0–1)
HCT: 25.1 % — ABNORMAL LOW (ref 39.0–52.0)
Lymphocytes Relative: 11 % — ABNORMAL LOW (ref 12–46)
MCHC: 31.1 g/dL (ref 30.0–36.0)
Neutro Abs: 11.9 10*3/uL — ABNORMAL HIGH (ref 1.7–7.7)
Neutrophils Relative %: 80 % — ABNORMAL HIGH (ref 43–77)
Platelets: 522 10*3/uL — ABNORMAL HIGH (ref 150–400)
RDW: 17.4 % — ABNORMAL HIGH (ref 11.5–15.5)
WBC: 15 10*3/uL — ABNORMAL HIGH (ref 4.0–10.5)

## 2012-05-28 MED ORDER — IOHEXOL 300 MG/ML  SOLN
100.0000 mL | Freq: Once | INTRAMUSCULAR | Status: AC | PRN
  Administered 2012-05-28: 100 mL via INTRAVENOUS

## 2012-05-28 MED ORDER — IOHEXOL 300 MG/ML  SOLN
20.0000 mL | INTRAMUSCULAR | Status: AC
  Administered 2012-05-28 (×2): 20 mL via ORAL

## 2012-05-28 MED ORDER — PRO-STAT SUGAR FREE PO LIQD
30.0000 mL | Freq: Every day | ORAL | Status: DC
  Administered 2012-05-29: 30 mL
  Filled 2012-05-28 (×2): qty 30

## 2012-05-28 MED ORDER — ACETAMINOPHEN 160 MG/5ML PO SOLN
325.0000 mg | Freq: Four times a day (QID) | ORAL | Status: DC | PRN
  Administered 2012-05-29 – 2012-06-02 (×5): 650 mg via ORAL
  Filled 2012-05-28 (×5): qty 20.3

## 2012-05-28 MED ORDER — VITAL 1.5 CAL PO LIQD
237.0000 mL | ORAL | Status: DC
  Administered 2012-05-28 – 2012-06-03 (×8): 237 mL
  Administered 2012-06-04 – 2012-06-05 (×2): 120 mL
  Filled 2012-05-28 (×41): qty 237

## 2012-05-28 NOTE — Progress Notes (Signed)
Subjective/Complaints: Persistent low grade temp. Slept well last night. Ate more yesterday. Had a "good day" with therapy yesterday A 12 point review of systems has been performed and if not noted above is otherwise negative.   Objective: Vital Signs: Blood pressure 94/60, pulse 110, temperature 98.7 F (37.1 C), temperature source Oral, resp. rate 18, height 5' 10"  (1.778 m), weight 64.1 kg (141 lb 5 oz), SpO2 97.00%. No results found.  Recent Labs  05/28/12 0538  WBC 15.0*  HGB 7.8*  HCT 25.1*  PLT 522*    Recent Labs  05/28/12 0538  NA 137  K 4.5  CL 101  GLUCOSE 99  BUN 20  CREATININE 0.97  CALCIUM 9.2   CBG (last 3)   Recent Labs  05/26/12 0739 05/26/12 1132 05/26/12 1800  GLUCAP 82 89 83    Wt Readings from Last 3 Encounters:  05/28/12 64.1 kg (141 lb 5 oz)  05/07/12 66.2 kg (145 lb 15.1 oz)  04/18/12 71.94 kg (158 lb 9.6 oz)    Physical Exam:  Constitutional: He appears well-developed. He has a sickly appearance.   HENT:  Head: Normocephalic and atraumatic.  Eyes: Pupils are equal, round, and reactive to light.  Neck: Normal range of motion.  Cardiovascular: Regular rhythm. Tachycardia present. .  Pulmonary/Chest: Effort normal. No respiratory distress.  He has no wheezes. Decreased sounds at the bases. Abdominal: He exhibits minimal distension. Bowel sounds are increased. There is tenderness.  Midline incision with retention sutures in place--incision nearly fully granulated in. Neurological: He is alert.  Basic insight and awareness. Was able to follow basic commands. Hoarse voice. 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 UE grossly  2to 3/5 LUE, 1-2/5 RUE with contracture in Hand (limited flexion). LE 1 to 2+/5 with weakness more in ankle dorsiflexion. DTR's 1+. Diffuse muscle wasting persistent.  Skin: Skin is warm and dry.  Psychiatric: relaxed  Assessment/Plan:  1. Functional deficits secondary to severe deconditioning, critical illness  neuropathy/myopathy after multiple medical which require 3+ hours per day of interdisciplinary therapy in a comprehensive inpatient rehab setting. Physiatrist is providing close team supervision and 24 hour management of active medical problems listed below. Physiatrist and rehab team continue to assess barriers to discharge/monitor patient progress toward functional and medical goals.    FIM: FIM - Bathing Bathing Steps Patient Completed: Chest;Right Arm;Abdomen;Front perineal area;Right upper leg;Left upper leg Bathing: 3: Mod-Patient completes 5-7 23f10 parts or 50-74% (supine for LB in w/c for upper body)  FIM - Upper Body Dressing/Undressing Upper body dressing/undressing steps patient completed: Thread/unthread right sleeve of pullover shirt/dresss;Thread/unthread left sleeve of pullover shirt/dress;Put head through opening of pull over shirt/dress;Pull shirt over trunk Upper body dressing/undressing: 5: Supervision: Safety issues/verbal cues FIM - Lower Body Dressing/Undressing Lower body dressing/undressing steps patient completed: Thread/unthread right pants leg;Thread/unthread left pants leg Lower body dressing/undressing: 2: Max-Patient completed 25-49% of tasks  FIM - Toileting Toileting: 0: Activity did not occur  FIM - TAir cabin crewTransfers: 0-Activity did not occur  FIM - BControl and instrumentation engineerDevices: Sliding board Bed/Chair Transfer: 2: Bed > Chair or W/C: Max A (lift and lower assist);2: Chair or W/C > Bed: Max A (lift and lower assist)  FIM - Locomotion: Wheelchair Locomotion: Wheelchair: 2: Travels 50 - 149 ft with supervision, cueing or coaxing FIM - Locomotion: Ambulation Ambulation/Gait Assistance: Not tested (comment) Locomotion: Ambulation: 0: Activity did not occur  Comprehension Comprehension Mode: Auditory Comprehension: 6-Follows complex conversation/direction: With extra time/assistive  device  Expression Expression Mode: Verbal Expression: 5-Expresses complex 90% of the time/cues < 10% of the time  Social Interaction Social Interaction Mode: Not assessed Social Interaction: 5-Interacts appropriately 90% of the time - Needs monitoring or encouragement for participation or interaction.  Problem Solving Problem Solving Mode: Not assessed Problem Solving: 5-Solves basic 90% of the time/requires cueing < 10% of the time  Memory Memory Mode: Not assessed Memory: 5-Recognizes or recalls 90% of the time/requires cueing < 10% of the time  Medical Problem List and Plan:  1. DVT Prophylaxis/Anticoagulation: Mechanical: Sequential compression devices, below knee Bilateral lower extremities thrombocytopenia resolved.   2. Multifactorial pain: tylenol, local measures, use ultram for more severe pain, HA 3. Mood: neuropsych consult requested. Needs encouragement  -klonopin 0.61m qhs 4. Neuropsych: This patient is not capable of making decisions on his/her own behalf.  5. FEN::   Serial labs, adjusting fluids, formula as well. Potassium slightly elevated--recheck Monday  -bolus feed trial, with po attempts as well 6. Bilateral cardioembolic CVA: no blood thinners due to issues with bleeding 7. ABLA:  Aranesp, iv fe++--may continue aranesp another week  -iron dextran through Friday. Recheck iron studies this week  -serial cbc's-  -no signs of overt blood loss  -continue observation and serial labs 8. 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 9. Low grade temp/leukocytosis- urine culture negative, s/p tap of pleural effusion  -WBC's slowly increasing again (15k)  -dc abx  -appreciated pulmonary/ccs help. Culture pending of pleural fluid  -pt denies feelling poorly  -?small thrombus right calf on doppler---doubt this would contribute to temp  -will  consult ID  -CT of abdomen and chest w/wo contrast  LOS (Days) 19 A FACE TO FACE EVALUATION WAS PERFORMED  Yudit Modesitt T 05/28/2012 7:56 AM

## 2012-05-28 NOTE — Progress Notes (Addendum)
Recreational Therapy Session Note  Patient Details  Name: Connor Small MRN: [id number] Date of Birth: Aug 08, 1960 Today's Date: 05/28/2012 Time:  215-300 Pain: no c/o Skilled Therapeutic Interventions/Progress Updates: Reviewed/discussed plans with pt and wife about cooking activity discussed last week.  Pt stated no recollection of prior discussion, but after probing from wife, later stated he remembered and that they created a list.  He then stated he couldn't physically complete the task.  Further discussion on adaptation and set up.  Will attempt kitchen activity later this week.  Pt completed sit-stands with PT & tech using Eva walker from elevated mat (24") with max A +2 for sit to stand and min to mod A static standing; able to weightshift and move R foot forward and back with mod facilitation and then same with L foot for pre-gait activity. Completed 3 sit to stands with 1:05, 1:15, and then ~20 seconds. Last attempt limited due to pt becoming lightheaded with symptoms resolving in seated position. RN notified at end of session.   Therapy/Group: Co-Treatment   Mellonie Guess 05/28/2012, 4:26 PM

## 2012-05-28 NOTE — Progress Notes (Signed)
NUTRITION FOLLOW UP  DOCUMENTATION CODES  Per approved criteria   -Severe malnutrition in the context of acute illness or injury    Intervention:    Continue the following enteral nutrition regimen:  Continue 237 ml bolus feedings of Vital 1.5 QID. Pt to receive bolus only if oral intake is <50% of meals. Pt should always receive HS bolus. Total bolus regimen (4 x 237 ml daily) will provide: 1420 kcal, 64 grams protein, 724 ml free water.  Pt would benefit from modular protein supplement of 30 ml Prostat QID. Wife is interested in starting 30 ml Prostat slowly, once daily. Each 30 ml Prostat will provide 100 kcal and 15 grams protein.  Once pt is tolerating Prostat daily, recommend adding Prostat via tube QID with current bolus regimen of 4 cans of Vital 1.5. This goal regimen would provide: 1820 kcal, 124 grams protein, 724 ml free water. Will provide: 83% calorie needs and 100% of protein needs.  RD to continue to follow nutrition care plan.  Nutrition Dx:   Inadequate oral intake related to poor appetite, early satiety, and lethargy as evidenced by poor meal completion. Ongoing.  Goal:   Intake to meet >90% of estimated nutrition needs. Met with enteral nutrition and oral intake.  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 Pt with acute respiratory and renal failure with ARDS and septic shock. Pt recently discharged to CIR, however currently admitted as inpatient due to possible aspiration PNA (4/2).   Hospital course nutrition summary:  2/2 - 2/20: permissively underfed with EN  2/21-2/26: advanced to full feeds  2/26 - 3/3: feeds held for surgery  3/4 - 3/11: resumed full feeds  3/11 - 3/12: feeds held for GI  bleed  3/13 - 3/24: received full feeds  3/24: TFs discontinued diet initiated; pt with very poor oral intake; tx to rehab 3/25  3/28 - 4/1: increased n/v - pt refusing almost all meals and supplements; wife declined calorie count, requesting TPN  4/2: NGT placed to suction, surgery ordering TF initiation; tx back to acute with plans for trickle feedings.  4/3-4/4: Trickle feeds of Vital 1.2 @ 30 mL/hr  4/5: PEG placement, resume of Vital 1.2  4/6-4/9: Inconsistent TF infusion and frequent adjustment of rate. Pt has not yet reached >40 mL/hr with tolerance. Wt now 151 lbs.  4/10-11: Pt has advanced as scheduled to 60 mL/hr. Discussed RN. Plan to increase to 70 mL this afternoon.  4/14: Pt has reached goal TF rate of 75 mL/hr with tolerance. PO intake remains insufficient with improvement per pt's report.  4/15: Pt ready to begin transition to cyclic feeds. RD to order Vital 1.2 @ 85 mL/hr x21 hrs daily. Will continue with daily advancements as tolerated and shorter duration of tubefeeds.  4/16: Pt agreeable to change formula to Vital 1.5 @ 80 mL/hr x 18 hrs daily.  4/17: Transfer to CIR. Continues with order of Vital 1.5 @ 80 ml/hr x 18 hrs daily. 4/21: Initiation of 3-day calorie count; continue Vital 1.5 @ 80 ml/hr x 18 hrs daily. 4/22: Day 1 Calorie Count Results -824 kcal (37% of minimum estimated needs) and 13.5 protein (11% of minimum estimated needs) 4/25:  Pt continues cyclic regimen.  Calorie count complete.  Wife and patient informed of results. ecreased cyclic feeds overnight and start transition to bolus feeds. Changes in regimen based on calorie count results are listed in interventions above. 4/25: Pt tolerated 80 mL bolus Vital 1.5 administered over 30 minutes at 6p.  Resume Vital 1.5 @ 80 mL/hr x 15 hrs from 7p-10a. 4/27 - 4/28: Pt with low grade fevers and n/v. Did not attempt boluses over weekend per wife. 4/29: Received 2 x 80 ml boluses at [employer]; Breakfast bolus  skipped 2/2 x-ray. 4/30: Received 2 x 160 ml boluses at [employer]; Breakfast bolus skipped 2/2 thoracentesis. 5/1: Received 3 x 237 ml boluses at Breakfast, Lunch, and [employer]. 5/3: Nocturnal feedings discontinued. 5/4: Pt eating well enough to the point where he did not need boluses. Nocturnal feedings discontinued. HS bolus added, per wife's request.  Working towards d/c date of 5/14. Most recent temperature is 98.7 degrees F.   Pt receiving 237 mL bolus of Vital 1.5 administered over 30 minutes at 0800, 1300, 1800, 2130. Pt to receive bolus if meal intake <50%. Nocturnal continuous feedings discontinued on .   Wife agreeable to adding prostat supplement, will add 30 ml Prostat via tube tomorrow at dinner per her request. Goal will be to tolerate prostat boluses QID.  Height: Ht Readings from Last 1 Encounters:  05/09/12 5' 10"  (1.778 m)    Weight Status:   Wt Readings from Last 1 Encounters:  05/28/12 141 lb 5 oz (64.1 kg)  Wt variable  Re-estimated needs:  Kcal: 2200 - 2400 Protein: 120 - 140 grams Fluid: > 2.2 liters  Skin: incisions, ileostomy, stage II pressure ulcer to R buttocks  Diet Order: General   Intake/Output Summary (Last 24 hours) at 05/28/12 1329 Last data filed at 05/28/12 1146  Gross per 24 hour  Intake    840 ml  Output   1750 ml  Net   -910 ml    Last BM: 5/4 (800 ml output today via ostomy yesterday)  Labs:   Recent Labs Lab 05/25/12 0610 05/28/12 0538  NA 136 137  K 4.0 4.5  CL 98 101  CO2 26 26  BUN 18 20  CREATININE 0.79 0.97  CALCIUM 8.7 9.2  GLUCOSE 118* 99    CBG (last 3)   Recent Labs  05/26/12 0739 05/26/12 1132 05/26/12 1800  GLUCAP 82 89 83    Scheduled Meds: . aspirin  81 mg Per Tube Daily  . chlorhexidine  15 mL Mouth Rinse BID  . clonazePAM  0.75 mg Oral QHS  . feeding supplement (VITAL 1.5 CAL)  237 mL Per Tube Custom  . ferrous sulfate  300 mg Per Tube BID WC  . free water  100 mL Per Tube Q3H  .  lidocaine  1 patch Transdermal Q24H  . lip balm   Topical BID  . loperamide  2 mg Per Tube QHS  . loperamide  2-4 mg Per Tube UD  . metoCLOPramide  10 mg Oral TID AC  . metoprolol tartrate  6.25 mg Per Tube Daily  . multivitamin  5 mL Per Tube Daily  . pantoprazole sodium  40 mg Per Tube Q1200  . saccharomyces boulardii  250 mg Oral BID    Continuous Infusions: none    Inda Coke MS, RD, LDN Pager: [phone] After-hours pager: [phone]

## 2012-05-28 NOTE — Progress Notes (Signed)
PULMONARY  / CRITICAL CARE MEDICINE  Name: Connor Small MRN: [id number] DOB: Jan 09, 1961    ADMISSION DATE:  05/09/2012 CONSULTATION DATE:  4/30  REFERRING MD :  Riley Kill PRIMARY SERVICE:  rehab  CHIEF COMPLAINT:  Left effusion   BRIEF PATIENT DESCRIPTION:  31 YOM admitted to [facility] for Cdiff on 2/2. Underwent decompressive cecostomy 2/3, Hospital course complicated by ARDS, PNA, septic shock, bilateral cardioembolic CVAs, renal failure, wound dehiscence and required multiple OR visits. To  rehab, aspiration event 3/29-->treated, back to rehab. PCCM asked once again to see on 4/30 for on going low gd temp,  left effusion (which has been chronic since feb 2014, but has gotten a little worse).    SIGNIFICANT EVENTS / STUDIES:   2/02 - Admit to [facility] with abdomen pain  2/03 - Decompressive Cecostomy  2/04 - Total abdominal colectomy and end ileostomy for toxic megacolon  2/06 - New A-fib and transfer to [facility]  2/09 - thora left 1200 exudative  2/10 - Ct head>> Old infarction in the right cerebellum and in the left frontal parietal white matter  2/10 - CT abdo /pelvis- small hematoma likely subcapsular spleen, JPs wnl  2/19 - [place] abdo #2>>Multifactorial degradation. Overlying bowel gas and patientclinical status. No explanation for elevated liver function tests.Similar to slight increase in size of a perisplenic fluidcollection.4. Right pleural effusion  3/28 - Called back to see on rehab for ? Asp  4/30 - L thora with 1750 orange tinged fluid removed, fluid c/w exudative process, no malignant cells, neg culture  LINES / TUBES:  CULTURES: UC 4/22 >> insig growth  Pleural Fluid 4/30 >>>no yeast / fungal, neg culture  ANTIBIOTICS: levaquin 4/22>>>5/4  HISTORY OF PRESENT ILLNESS:   [name] admitted to [facility] for Cdiff on 2/2. Underwent decompressive cecostomy 2/3, Hospital course complicated by ARDS, PNA, septic shock, bilateral cardioembolic CVAs,renal failure, wound dehiscence and  required multiple OR visits. To [facility] rehab, aspiration event 3/29-->treated, back to rehab 4/17. 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  Has been having low grade fever since 4/22 w/ sl increase in WBCs. [name]. [name], and also some worsening of his left effusion, which has really been a chronic issue dating back to feb. [name]. PCCM asked once again to see on 4/30 for left effusion and question as to if this could be contributing to his fever.  SUBJECTIVE: No acute events.  Continues to have mild low grade fevers - Tmax 100.6  VITAL SIGNS: Temp:  [98.7 F (37.1 C)-100.6 F (38.1 C)] 98.7 F (37.1 C) (05/05 0425) Pulse Rate:  [110-128] 110 (05/05 0425) Resp:  [18] 18 (05/04 1504) BP: (94-95)/(60-65) 94/60 mmHg (05/05 0425) SpO2:  [97 %-98 %] 97 % (05/05 0425) Weight:  [141 lb 5 oz (64.1 kg)] 141 lb 5 oz (64.1 kg) (05/05 0600) Room air   PHYSICAL EXAMINATION: General:  Awake, oriented, chronically ill appearing  Neuro:  Oriented. Generalized weakness w/ RUE weakness. Some short term mem def  HEENT:  [place], no JVD Cardiovascular:  reg Lungs:  Decreased left  Abdomen:  Mid abd dressing intact, ostomy draining. PEG unremark Musculoskeletal:  Generalized weakness but improved, no sig swelling    Recent Labs Lab 05/25/12 0610 05/28/12  0538  NA 136 137  K 4.0 4.5  CL 98 101  CO2 26 26  BUN 18 20  CREATININE 0.79 0.97  GLUCOSE 118* 99    Recent Labs Lab 05/24/12 0625 05/25/12 0610 05/28/12 0538  HGB 8.7* 8.0* 7.8*  HCT 26.9* 24.8* 25.1*  WBC 13.6* 14.2* 15.0*  PLT 514* 535* 522*   No results found. ASSESSMENT / PLAN: Left pleural effusion. Chronic since feb but somewhat bigger. Was exudate in past and again on 4/30.   Negative culture, neg fungal, & cytology.    Plan -completed abx 5/4 (levaquin) -CT Chest, ABD / Pelvis planned for 5/5 -recommend outpatient pulmonary follow up post discharge.  -aggressive pulmonary hygiene -nutritional support      [name], NP-C [facility] Pgr: [phone] or [phone]   PCCM Attending MD: I have interviewed and examined the patient and reviewed the database. I have formulated the assessment and plan as reflected in the note above with amendments made by me.   The L effusion is very large. It has been tapped before and is exudative in character. It either represents an para-pneumonic process or, more likely, a reactive/sympathetic process related to subdiaphragmatic pathology. Will await CT C/A/P and decide how to proceed    Billy Fischer, MD;  PCCM service; Mobile [phone]

## 2012-05-28 NOTE — Progress Notes (Signed)
Subjective: [name] is a 52 y.o. male with history of ulcerative colitis as well as prior diverticular perforation, who was admitted to [facility] on 02/26/2012 with low back pain, fever 102.5 and multiple episodes of watery diarrhea. Underwent flexible sigmoidoscopy 02/27/2012 per Dr.Rehman with findings of multiple pseudomembranes consistent with C. difficile colitis as well as low colorectal stricture. Underwent exploratory laparotomy,cecostomy tube placement per Dr. Bernette Mayers 02/27/2012. 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 Post op he developed ARDS with septic shock as well as renal failure due to AKI. On 02/06 he developed new onset Atrial fibrillation with RVR and was transferred to [facility] for treatment.   He was brought back to there OR 3 subsequent times due to wound evisceration treated eventually with placement of retention sutures and wound closure on 3/9 by Dr. Excell Seltzer.   We were consulted about 1.42moago regarding failure to thrive and anorexia.  He underwent a G-tube placement by IR.  He has since eating better and participating heavily with PT.  I was asked to see the patient about increasing WBC and low grade temps.  He is currently at rehab and has been doing well.  Pt denies abdominal pain, N/V/D.  A CT of the abdomen and pelvis was obtained and showed 2 fluid collections concerning for abscess.  Objective: Vital signs in last 24 hours: Temp:  [98.7 F (37.1 C)-100.6 F (38.1 C)] 98.7 F (37.1 C) (05/05 0425) Pulse Rate:  [110] 110 (05/05 0425) BP: (94)/(60) 94/60 mmHg (05/05 0425) SpO2:  [97 %] 97 % (05/05 0425) Weight:  [141 lb 5 oz (64.1 kg)] 141 lb 5 oz (64.1 kg) (05/05 0600) Last BM Date: 05/28/12  Intake/Output from previous day: 05/04 0701 - 05/05 0700 In: 360 [P.O.:360] Out: 1975 [Urine:1175; Stool:800] Intake/Output this shift: Total I/O In: 1180 [P.O.:480; Other:600;  NG/GT:100] Out: 1150 [Urine:600; Stool:550]  PE: Gen:  Alert, NAD, pleasant, clean shaven, hair recently cut, appears better than I've ever seen him, gained weight Abd: Soft, NT/ND, no HSM, midline abdominal scars noted which are nearly healed with dry dressings overtop, ileostomy patent   Lab Results:   Recent Labs  05/28/12 0538  WBC 15.0*  HGB 7.8*  HCT 25.1*  PLT 522*   BMET  Recent Labs  05/28/12 0538  NA 137  K 4.5  CL 101  CO2 26  GLUCOSE 99  BUN 20  CREATININE 0.97  CALCIUM 9.2   PT/INR No results found for this basename: LABPROT, INR,  in the last 72 hours CMP     Component Value Date/Time   NA 137 05/28/2012 0538   K 4.5 05/28/2012 0538   CL 101 05/28/2012 0538   CO2 26 05/28/2012 0538   GLUCOSE 99 05/28/2012 0538   BUN 20 05/28/2012 0538   CREATININE 0.97 05/28/2012 0538   CALCIUM 9.2 05/28/2012 0538   PROT 6.0 05/28/2012 0538   ALBUMIN 1.9* 05/28/2012 0538   AST 17 05/28/2012 0538   ALT 13 05/28/2012 0538   ALKPHOS 125* 05/28/2012 0538   BILITOT 0.4 05/28/2012 0538   GFRNONAA >90 05/28/2012 0538   GFRAA >90 05/28/2012 0538   Lipase     Component Value Date/Time   LIPASE 34 04/02/2012 0424       Studies/Results: Ct Chest W Contrast  05/28/2012  **ADDENDUM** CREATED: 05/28/2012 14:34:16  There is a typographical error in the first paragraph of the abdomen and pelvis portion of the  report.  "This measures 4.7 x 810.3 by 9.5 cm."  This should read:   "This measures 4.7 x 8.3 x 9.5 cm."  **END ADDENDUM** SIGNED BY: Angelita Ingles, M.D.   05/28/2012  *RADIOLOGY REPORT*  Clinical Data:  Fever and leukocytosis  CT CHEST, ABDOMEN AND PELVIS WITH CONTRAST  Technique:  Multidetector CT imaging of the chest, abdomen and pelvis was performed following the standard protocol during bolus administration of intravenous contrast.  Contrast: 167m OMNIPAQUE IOHEXOL 300 MG/ML  SOLN  Comparison:  CT abdomen and pelvis 04/24/2012.  CT CHEST  Findings:  There is a large left pleural effusion.   Moderate right pleural effusion is present.  Compressive type consolidation and atelectasis is noted within the left midlung.  The heart size is normal.  There is no pericardial effusion.  No enlarged lymph node within the mediastinum or hilar region.  Review of the visualized bony structures is unremarkable.  No worrisome lytic or sclerotic bone lesions.  IMPRESSION:  1.  Bilateral pleural effusions, left greater than right.  CT ABDOMEN AND PELVIS  Findings:  There is no focal liver abnormality.  Gallbladder appears normal.  No biliary dilatation.  The pancreas is unremarkable.  There is a large fluid collection overlying the spleen.  This measures 4.7 x 810.3 by 9.5 cm.  A second fluid collection encases the small bowel loops and extends into the right upper quadrant along the anterior margin of the right hepatic lobe.  The adrenal glands are both normal.  Normal appearance of the right kidney.  Multiple hypodensities are noted within the left kidney which may represent cysts.  Urinary bladder appears normal.  The prostate gland and seminal vesicles are unremarkable.  There is no adenopathy within the upper abdomen.  There is no pelvic or inguinal adenopathy.  The patient has a gastrostomy tube. The small bowel loops have a normal caliber.  The right lower quadrant ileostomy noted.  The patient is status post subtotal colectomy.  Review of the visualized bony structures is unremarkable.  IMPRESSION:  1.  There are two fluid collections within the abdomen. This likely accounts for the patient's persistent fever and leukocytosis.  The first is in the left upper quadrant and extends around the spleen. The second encases the small bowel loops and extends along the anterior margin of the liver.  These should be amendable to percutaneous drainage under image guidance. 2.  Gastrostomy. 3.  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  Original Report Authenticated By: TKerby Moors M.D.    Ct Abdomen Pelvis  W Contrast  05/28/2012  **ADDENDUM** CREATED: 05/28/2012 14:34:16  There is a typographical error in the first paragraph of the abdomen and pelvis portion of the report.  "This measures 4.7 x 810.3 by 9.5 cm."  This should read:   "This measures 4.7 x 8.3 x 9.5 cm."  **END ADDENDUM** SIGNED BY: TAngelita Ingles M.D.   05/28/2012  *RADIOLOGY REPORT*  Clinical Data:  Fever and leukocytosis  CT CHEST, ABDOMEN AND PELVIS WITH CONTRAST  Technique:  Multidetector CT imaging of the chest, abdomen and pelvis was performed following the standard protocol during bolus administration of intravenous contrast.  Contrast: 101mOMNIPAQUE IOHEXOL 300 MG/ML  SOLN  Comparison:  CT abdomen and pelvis 04/24/2012.  CT CHEST  Findings:  There is a large left pleural effusion.  Moderate right pleural effusion is present.  Compressive type consolidation and atelectasis is noted within the left midlung.  The heart size is normal.  There is no pericardial effusion.  No enlarged lymph node within the mediastinum or hilar region.  Review of the visualized bony structures is unremarkable.  No worrisome lytic or sclerotic bone lesions.  IMPRESSION:  1.  Bilateral pleural effusions, left greater than right.  CT ABDOMEN AND PELVIS  Findings:  There is no focal liver abnormality.  Gallbladder appears normal.  No biliary dilatation.  The pancreas is unremarkable.  There is a large fluid collection overlying the spleen.  This measures 4.7 x 810.3 by 9.5 cm.  A second fluid collection encases the small bowel loops and extends into the right upper quadrant along the anterior margin of the right hepatic lobe.  The adrenal glands are both normal.  Normal appearance of the right kidney.  Multiple hypodensities are noted within the left kidney which may represent cysts.  Urinary bladder appears normal.  The prostate gland and seminal vesicles are unremarkable.  There is no adenopathy within the upper abdomen.  There is no pelvic or inguinal adenopathy.   The patient has a gastrostomy tube. The small bowel loops have a normal caliber.  The right lower quadrant ileostomy noted.  The patient is status post subtotal colectomy.  Review of the visualized bony structures is unremarkable.  IMPRESSION:  1.  There are two fluid collections within the abdomen. This likely accounts for the patient's persistent fever and leukocytosis.  The first is in the left upper quadrant and extends around the spleen. The second encases the small bowel loops and extends along the anterior margin of the liver.  These should be amendable to percutaneous drainage under image guidance. 2.  Gastrostomy. 3.  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  Original Report Authenticated By: Kerby Moors, M.D.     Anti-infectives: Anti-infectives   Start     Dose/Rate Route Frequency Ordered Stop   05/26/12 0800  fluconazole (DIFLUCAN) 40 MG/ML suspension 100 mg  Status:  Discontinued     100 mg Oral Daily 05/25/12 0655 05/25/12 0656   05/25/12 0700  fluconazole (DIFLUCAN) 40 MG/ML suspension 200 mg  Status:  Discontinued     200 mg Oral  Once 05/25/12 0655 05/25/12 0656   05/16/12 1800  levofloxacin (LEVAQUIN) tablet 750 mg  Status:  Discontinued     750 mg Oral Daily-1800 05/16/12 1551 05/27/12 0655   05/15/12 0830  ciprofloxacin (CIPRO) tablet 500 mg  Status:  Discontinued    Comments:  Begin after urine sample is collected   500 mg Oral 2 times daily 05/15/12 0740 05/16/12 1551       Assessment/Plan Subtotal colectomy with RLQ ileostomy ([place]), Wound extravasation and 3 times to the OR for wound closure ([place]) Failure to thrive - improved after G-tube placement Low grade temps & leukocytosis (15.0) 2 new intraabdominal fluid collections, possible abscess: -LUQ extends around spleen -Encases the small bowel loops and extends along the anterior margin of the liver  Plan:   1.  Recommend IR aspiration/drainage of fluid collection and culture with possible  drains as needed 2.  Would recommend antibiotics per choice of ID after procedure and gram stain 3.  Continue with rehab, PT/OT as tolerated to try and discharge home at anticipated discharge date 4.  No surgical interventions needed at this time    LOS: 19 days    DORT, Jinny Blossom 05/28/2012, 3:38 PM Pager: [phone]

## 2012-05-28 NOTE — Progress Notes (Signed)
Physical Therapy Session Note  Patient Details  Name: Connor Small MRN: [id number] Date of Birth: 1960-12-05  Today's Date: 05/28/2012 Time: 1400-1455 Time Calculation (min): 55 min  Short Term Goals: Week 3:  PT Short Term Goal 1 (Week 3): Pt will be able to maintain standing x 1 min with +2 assist to aid with postural control and LE strengthening PT Short Term Goal 2 (Week 3): Pt will be able to complete sit to stand from elevated surface 2/3 trials with max A +2 PT Short Term Goal 3 (Week 3): Pt's wife will be able to demonstrate safe slide board transfer/set-up with min verbal cues to prepare for d/c home  Skilled Therapeutic Interventions/Progress Updates:   Wife assisting patient with transfer OOB with min cues needed for proper body mechanics and positioning of slideboard with pt directing transfer as well; encouragement from therapist for pt to actively do more of the transfer rather than wife just lifting pt across the board with max A (pt quickly frustrated with this, stating his wife can just lift him. Education provided to both pt and wife on importance of not just doing dependent transfers). Therapist assist pt with slide board transfers on/off the mat in the gym (max A out of chair and mod A from mat to w/c slightly downhill) with cues for pt to actively push through LE's and UE's to aid with transfer. LE stretching in seated position to heel cords and hamstrings with education to wife to do this throughout the day when pt up in the chair with recommendation for 30 second hold x 3 reps bilaterally.   Neuro re-ed in standing for postural control from elevated mat (24") and max A +2 for sit to stand and min to mod A static standing; able to weightshift and move R foot forward and back with mod facilitation and then same with L foot for pre-gait activity. Completed 3 sit to stands with 1:05, 1:15, and then ~20 seconds. Last attempt limited due to pt becoming lightheaded with symptoms  resolving in seated position. RN notified at end of session.   Therapy Documentation Precautions:  Precautions Precautions: Fall;Shoulder Type of Shoulder Precautions: right shoulder subluxation  Precaution Comments:  ostomy, tachycardia, Afib, peg tube (doesn't wear abd binder anymore) Required Braces or Orthoses: Other Brace/Splint Other Brace/Splint: right wrist splint Restrictions Weight Bearing Restrictions: No   Pain:  Denies pain. Reports fatigue from CT scan earlier.   See FIM for current functional status  Therapy/Group: Individual Therapy  [name] [facility] 05/28/2012, 4:11 PM

## 2012-05-28 NOTE — Progress Notes (Signed)
Connor Small. Connor Dover, MD, [facility]  05/28/2012 4:16 PM

## 2012-05-28 NOTE — Progress Notes (Signed)
Occupational Therapy Weekly Progress Note & Session Note  Patient Details  Name: Connor Small MRN: [id number] Date of Birth: [birth year]  Today's Date: 05/28/2012  WEEKLY PROGRESS NOTE Patient has met 4 of 4 short term goals.  Patient continues to make steady progress on CIR. Patient is able to sit edge of bed for UB/LB dressing, using reacher to assist with LB dressing and patient uses SARA PLUS to stand edge of bed to pull pants up to waist. Patient has stood with total X2 assist using EVA walker with PT/OT co-treatments. Plan remains the same, for patient to d/c 06/06/12 -> home with recommendation of HHOT.   Patient continues to demonstrate the following deficits: decreased overall activity tolerance/endurance, decreased independence with ADLs/IADLs, decreased independence with functional mobility, decreased independence with transfers, decreased functional use of RUE/hand, decreased static & dynamic standing balance/tolerance/endurance. Therefore, patient will continue to benefit from skilled OT intervention to enhance overall performance with BADL, iADL and Reduce care partner burden.  Patient progressing toward long term goals..  Continue plan of care.  OT Short Term Goals Week 1:  OT Short Term Goal 1 (Week 1): Self Feeding:  Patient will feed himself at least 50% of 2 meals per day during this recording period. OT Short Term Goal 1 - Progress (Week 1): Progressing toward goal OT Short Term Goal 2 (Week 1): Grooming:  Patient will perform 2 grooming tasks with min assist to include set up once items needed are provided OT Short Term Goal 2 - Progress (Week 1): Progressing toward goal OT Short Term Goal 3 (Week 1): UB Dressing:  Patient will don shirt in supported sit with max assist OT Short Term Goal 3 - Progress (Week 1): Met OT Short Term Goal 4 (Week 1): Sitting balance:  Patient will sit without back support with mod assist while completing simple BADL task at least 2 days this  recording period. OT Short Term Goal 4 - Progress (Week 1): Met OT Short Term Goal 5 (Week 1): UE exercises:  Patient will tolerate BUE exercises at least 4 times during this recording period. OT Short Term Goal 5 - Progress (Week 1): Met  Week 2:  OT Short Term Goal 1 (Week 2): Patient will perform grooming tasks with minimal assistance OT Short Term Goal 1 - Progress (Week 2): Met OT Short Term Goal 2 (Week 2): Patient will perform LB dressing with moderate assistance (pants only) OT Short Term Goal 2 - Progress (Week 2): Met OT Short Term Goal 3 (Week 2): Patient will be able to direct care with slide board transfer with min verbal cues OT Short Term Goal 3 - Progress (Week 2): Met OT Short Term Goal 4 (Week 2): Patient will be able to direct UE exercises with min verbal cues OT Short Term Goal 4 - Progress (Week 2): Met  Week 3:  OT Short Term Goal 1 (Week 3): Short Term Goals = Long Term Goals  Skilled Therapeutic Interventions/Progress Updates:  Balance/vestibular training;Cognitive remediation/compensation;Community reintegration;Discharge planning;DME/adaptive equipment instruction;Functional electrical stimulation;Functional mobility training;Neuromuscular re-education;Patient/family education;Pain management;Psychosocial support;Self Care/advanced ADL retraining;Skin care/wound managment;Splinting/orthotics;Therapeutic Activities;Therapeutic Exercise;UE/LE Strength taining/ROM;UE/LE Coordination activities;Wheelchair propulsion/positioning   Precautions:  Precautions Precautions: Fall;Shoulder Type of Shoulder Precautions: right shoulder subluxation  Precaution Comments:  ostomy, tachycardia, Afib, peg tube (doesn't wear abd binder anymore) Required Braces or Orthoses: Other Brace/Splint Other Brace/Splint: right wrist splint Restrictions Weight Bearing Restrictions: No  See FIM for current functional  status  --------------------------------------------------------------------------------------------------------------------------  SESSION NOTE 0935-1000 - 55 Minutes  Individual Therapy No complaints of pain Patient found supine in bed. Focused skilled intervention on RUE strengthening and stretching exercises and educated patient on exercises and self-directing care during exercises. Explained exercises and wrote exercises on paper as well as continued education -> wife regarding exercises.  Patient unable to get dressed or get OOB secondary to scheduled CT scan. RN present towards end of session for medication prior to CT scan.   Connor Small 05/28/2012, 10:22 AM

## 2012-05-28 NOTE — Plan of Care (Signed)
Problem: RH SKIN INTEGRITY Goal: RH STG ABLE TO PERFORM INCISION/WOUND CARE W/ASSISTANCE STG Able To Perform Incision/Wound Care Max Assistance of caregiver.  Outcome: Not Progressing Wife does not complete dressing changes; anticipates will not need to complete dressing at discharge

## 2012-05-28 NOTE — Progress Notes (Signed)
Speech Language Pathology Daily Session Note  Patient Details  Name: Connor Small MRN: [id number] Date of Birth: March 08, 1960  Today's Date: 05/28/2012 Time: 1135-1200 Time Calculation (min): 25 min  Short Term Goals: Week 3: SLP Short Term Goal 1 (Week 3): Pt will initiate tasks with Mod I SLP Short Term Goal 2 (Week 3): Pt will demonstrate alternating attention between two functional tasks with supervision verbal cues for redirection for 30 minutes SLP Short Term Goal 3 (Week 3): Pt will utilize external memory aids to recall new, daily information with Mod I SLP Short Term Goal 4 (Week 3): Pt will demonstrate functional problem solving with complex tasks with supervision verbal cues.   Skilled Therapeutic Interventions: Treatment focus on cognitive goals. Upon entering the room, pt was asleep in his bed awaiting a CT scan. Pt participated in functional conversation in regards to current physical and cognitive progress, events from daily therapies and current goals. Pt recalled all information with Mod I and directed his care throughout the session with supervision question cues.    FIM:  Comprehension Comprehension Mode: Auditory Comprehension: 6-Follows complex conversation/direction: With extra time/assistive device Expression Expression Mode: Verbal Expression: 5-Expresses complex 90% of the time/cues < 10% of the time Social Interaction Social Interaction: 5-Interacts appropriately 90% of the time - Needs monitoring or encouragement for participation or interaction. Problem Solving Problem Solving: 5-Solves complex 90% of the time/cues < 10% of the time Memory Memory: 5-Recognizes or recalls 90% of the time/requires cueing < 10% of the time FIM - Eating Eating Activity: 1: Helper performs IV, parenteral, or tube feeding  Pain No/Denies Pain  Therapy/Group: Individual Therapy  Shermon Bozzi, [place] 05/28/2012, 3:28 PM

## 2012-05-28 NOTE — Consult Note (Signed)
INFECTIOUS DISEASE CONSULT NOTE  Date of Admission:  05/09/2012  Date of Consult:  05/28/2012  Reason for Consult: Fever, Leukocytosis Referring Physician: Tessa Lerner  Impression/Recommendation Fever Leukocytosis Inta-abdominal fluid collections (? Abscess)  Would- Hold anbx Await IR aspirate of fluid collection(s)  Comment- My great appreciation to surgery/rehab for getting CT scans. Would not start abx til we have Cx to hopefully improve the yield of Cx's.  Will f/u tomorrow.   Thank you so much for this interesting consult,   Bobby Rumpf [phone]  Connor Small is an 52 y.o. male.  HPI: 52 yo M with hx of ulcerative colitis, previous C diff (02-27-12 required colectomy and ileostomy, complicated by sepsis, a-fib, ARF, wound evisceration, HIT, GI bleed, bilateral  embolic CVA, critical illness neuropathy/myopathy). He was treated for  C diff for 3 weeks. He was able to be d/c to rehab 04-17-12 where his course was further complicated by aspiration. He was started on vanco/aztreoman. He required re-admission to [facility] 04-25-12 (anbx stopped then at day 5) with abd fluid collection, sampled by IR (felt to be consitent with exudate). He underwent gastrostomy placement on 04-28-12. By 4-16 he was well enough to go back to rehab.  He was started on levaquin on 4-22 for low grade temps, WBC elevation (which has been persistent throughout hospital stay). He underwent 1.75 L thoracentesis 4-30 which was Cx (-). His anbx were stopped on 05-27-12.  His WBC were up to 15 today and we are asked to eval.  He was also found to have a R calf superficial thrombus on u/s on 4-25.  Today he underwent CT chest/abd/pelvis: Pleural effusions. There are two fluid collections within the abdomen. This likely accounts for the patient's persistent fever and leukocytosis. The first is in the left upper quadrant and extends around the spleen. The second encases the small bowel loops and extends along the anterior margin  of the liver. These should be amendable to percutaneous drainage under image guidance.  Past Medical History  Diagnosis Date  . Chronic diarrhea   . Rectal bleed   . Hemorrhoids   . Ulcerative colitis     Distal UC over 8 yrs ago diagnosed  . Diverticulitis of large intestine with perforation 10/2011    done at [place]  . 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 history of UC (ulcerative colitis) 12/06/2010    Past Surgical History  Procedure Laterality Date  . Colonoscopy  9/03  . Sigmoidoscopy       06/13/2002  . Temporary ostomy November 06, 2010      for a colon perforation that was done in [facility] (Dr Payton Doughty).    . Colonoscopy  02/16/2011    Procedure: COLONOSCOPY;  Surgeon: Rogene Houston, MD;  [facility];  Service: Endoscopy;  Laterality: N/A;  100  . Flexible sigmoidoscopy  02/27/2012    Procedure: FLEXIBLE SIGMOIDOSCOPY;  Surgeon: Rogene Houston, MD;  [facility];  Service: Endoscopy;  Laterality: N/A;  with colonic decompression  . Laparotomy  02/27/2012    Procedure: EXPLORATORY LAPAROTOMY;  Surgeon: Donato Heinz, MD;  Location: [facility];  Service: General;  Laterality: N/A;  . Cecostomy  02/27/2012    Procedure: CECOSTOMY;  Surgeon: Donato Heinz, MD;  Location: [facility];  Service: General;  Laterality: N/A;  Cecostomy Tube Placement  . Colectomy  02/28/2012    Procedure: TOTAL COLECTOMY;  Surgeon: Donato Heinz, MD;  Location: [facility];  Service: General;  Laterality: N/A;  . Ileostomy  02/28/2012    Procedure: ILEOSTOMY;  Surgeon: Donato Heinz, MD;  Location: [facility];  Service: General;  Laterality: N/A;  . Cystoscopy w/ ureteral stent placement Bilateral 03/20/2012    Procedure: CYSTOSCOPY WITH RETROGRADE PYELOGRAM/URETERAL STENT PLACEMENT;  Surgeon: Alexis Frock, MD;  Location: [place];  Service: Urology;  Laterality: Bilateral;  . Laparotomy N/A 03/26/2012    Procedure: EXPLORATORY LAPAROTOMY;  Surgeon: Edward Jolly, MD;  Location: [place];  Service:  General;  Laterality: N/A;  . Application of wound vac N/A 03/26/2012    Procedure: APPLICATION OF WOUND VAC;  Surgeon: Edward Jolly, MD;  Location: [facility];  Service: General;  Laterality: N/A;  . Liver biopsy N/A 03/26/2012    Procedure: LIVER BIOPSY;  Surgeon: Edward Jolly, MD;  Location: [place];  Service: General;  Laterality: N/A;  . Laparotomy N/A 03/29/2012    Procedure: EXPLORATORY LAPAROTOMY, PARTIAL WOUND CLOSURE;  Surgeon: Edward Jolly, MD;  Location: [place];  Service: General;  Laterality: N/A;  . Vacuum assisted closure change N/A 03/29/2012    Procedure: Open ABDOMINAL VACUUM  CHANGE;  Surgeon: Edward Jolly, MD;  Location: [place];  Service: General;  Laterality: N/A;  . Vacuum assisted closure change N/A 04/01/2012    Procedure: removal of abdominal vac dressing and abdominal closure;  Surgeon: Edward Jolly, MD;  Location: [place];  Service: General;  Laterality: N/A;     Allergies  Allergen Reactions  . Penicillins Other (See Comments)    Heart rate changes  . Morphine And Related     Nausea/vomiting  . Oxycodone     Nausea/vomiting  . Imipenem Rash    Medications:  Scheduled: . aspirin  81 mg Per Tube Daily  . chlorhexidine  15 mL Mouth Rinse BID  . clonazePAM  0.75 mg Oral QHS  . [START ON 05/29/2012] feeding supplement  30 mL Per Tube QAC supper  . feeding supplement (VITAL 1.5 CAL)  237 mL Per Tube Custom  . ferrous sulfate  300 mg Per Tube BID WC  . free water  100 mL Per Tube Q3H  . lidocaine  1 patch Transdermal Q24H  . lip balm   Topical BID  . loperamide  2 mg Per Tube QHS  . loperamide  2-4 mg Per Tube UD  . metoCLOPramide  10 mg Oral TID AC  . metoprolol tartrate  6.25 mg Per Tube Daily  . multivitamin  5 mL Per Tube Daily  . pantoprazole sodium  40 mg Per Tube Q1200  . saccharomyces boulardii  250 mg Oral BID    Total days of antibiotics: off      levaquin  4-23  ---->5-3        vanco (po)       3-29  ----> 4-5     Aztreonam        3-29 ---->  4-2     Social History:  reports that he quit smoking about 27 years ago. His smoking use included Cigarettes. He smoked 0.00 packs per day. He has never used smokeless tobacco. He reports that he does not drink alcohol or use illicit drugs.  Family History  Problem Relation Age of Onset  . Cancer Sister   . Healthy Daughter   . Hypertension Mother     General ROS: per wife- eating well. no difficulty with urination, has peripheral IV. see HPI.   Blood pressure 94/60, pulse 110, temperature 98.7  F (37.1 C), temperature source Oral, resp. rate 18, height 5' 10"  (1.778 m), weight 64.1 kg (141 lb 5 oz), SpO2 97.00%. General appearance: alert, cooperative, fatigued and no distress Eyes: negative findings: pupils equal, round, reactive to light and accomodation Throat: normal findings: oropharynx pink & moist without lesions or evidence of thrush Neck: no adenopathy and supple, symmetrical, trachea midline Lungs: clear to auscultation bilaterally Heart: regular rate and rhythm and x Abdomen: normal findings: bowel sounds normal, soft, non-tender and midline wound is well healed, 2 areas of superficial wound. sutures visible within these. and x Extremities: edema none   Results for orders placed during the hospital encounter of 05/09/12 (from the past 48 hour(s))  GLUCOSE, CAPILLARY     Status: None   Collection Time    05/26/12  6:00 PM      Result Value Range   Glucose-Capillary 83  70 - 99 mg/dL   Comment 1 Notify RN    COMPREHENSIVE METABOLIC PANEL     Status: Abnormal   Collection Time    05/28/12  5:38 AM      Result Value Range   Sodium 137  135 - 145 mEq/L   Potassium 4.5  3.5 - 5.1 mEq/L   Chloride 101  96 - 112 mEq/L   CO2 26  19 - 32 mEq/L   Glucose, Bld 99  70 - 99 mg/dL   BUN 20  6 - 23 mg/dL   Creatinine, Ser 0.97  0.50 - 1.35 mg/dL   Calcium 9.2  8.4 - 10.5 mg/dL   Total Protein 6.0  6.0 - 8.3 g/dL   Albumin 1.9 (*) 3.5 - 5.2 g/dL   AST 17  0 -  37 U/L   ALT 13  0 - 53 U/L   Alkaline Phosphatase 125 (*) 39 - 117 U/L   Total Bilirubin 0.4  0.3 - 1.2 mg/dL   GFR calc non Af Amer >90  >90 mL/min   GFR calc Af Amer >90  >90 mL/min   Comment:            The eGFR has been calculated     using the CKD EPI equation.     This calculation has not been     validated in all clinical     situations.     eGFR's persistently     <90 mL/min signify     possible Chronic Kidney Disease.  CBC WITH DIFFERENTIAL     Status: Abnormal   Collection Time    05/28/12  5:38 AM      Result Value Range   WBC 15.0 (*) 4.0 - 10.5 K/uL   RBC 2.77 (*) 4.22 - 5.81 MIL/uL   Hemoglobin 7.8 (*) 13.0 - 17.0 g/dL   HCT 25.1 (*) 39.0 - 52.0 %   MCV 90.6  78.0 - 100.0 fL   MCH 28.2  26.0 - 34.0 pg   MCHC 31.1  30.0 - 36.0 g/dL   RDW 17.4 (*) 11.5 - 15.5 %   Platelets 522 (*) 150 - 400 K/uL   Neutrophils Relative 80 (*) 43 - 77 %   Neutro Abs 11.9 (*) 1.7 - 7.7 K/uL   Lymphocytes Relative 11 (*) 12 - 46 %   Lymphs Abs 1.6  0.7 - 4.0 K/uL   Monocytes Relative 9  3 - 12 %   Monocytes Absolute 1.3 (*) 0.1 - 1.0 K/uL   Eosinophils Relative 1  0 - 5 %  Eosinophils Absolute 0.1  0.0 - 0.7 K/uL   Basophils Relative 0  0 - 1 %   Basophils Absolute 0.1  0.0 - 0.1 K/uL      Component Value Date/Time   SDES FLUID LEFT PLEURAL 05/23/2012 1037   SDES FLUID LEFT PLEURAL 05/23/2012 1037   SPECREQUEST Normal 05/23/2012 1037   SPECREQUEST Normal 05/23/2012 1037   CULT NO GROWTH 3 DAYS 05/23/2012 1037   REPTSTATUS 05/26/2012 FINAL 05/23/2012 1037   REPTSTATUS 05/24/2012 FINAL 05/23/2012 1037   Ct Chest W Contrast  05/28/2012  **ADDENDUM** CREATED: 05/28/2012 14:34:16  There is a typographical error in the first paragraph of the abdomen and pelvis portion of the report.  "This measures 4.7 x 810.3 by 9.5 cm."  This should read:   "This measures 4.7 x 8.3 x 9.5 cm."  **END ADDENDUM** SIGNED BY: Angelita Ingles, M.D.   05/28/2012  *RADIOLOGY REPORT*  Clinical Data:  Fever and  leukocytosis  CT CHEST, ABDOMEN AND PELVIS WITH CONTRAST  Technique:  Multidetector CT imaging of the chest, abdomen and pelvis was performed following the standard protocol during bolus administration of intravenous contrast.  Contrast: 118m OMNIPAQUE IOHEXOL 300 MG/ML  SOLN  Comparison:  CT abdomen and pelvis 04/24/2012.  CT CHEST  Findings:  There is a large left pleural effusion.  Moderate right pleural effusion is present.  Compressive type consolidation and atelectasis is noted within the left midlung.  The heart size is normal.  There is no pericardial effusion.  No enlarged lymph node within the mediastinum or hilar region.  Review of the visualized bony structures is unremarkable.  No worrisome lytic or sclerotic bone lesions.  IMPRESSION:  1.  Bilateral pleural effusions, left greater than right.  CT ABDOMEN AND PELVIS  Findings:  There is no focal liver abnormality.  Gallbladder appears normal.  No biliary dilatation.  The pancreas is unremarkable.  There is a large fluid collection overlying the spleen.  This measures 4.7 x 810.3 by 9.5 cm.  A second fluid collection encases the small bowel loops and extends into the right upper quadrant along the anterior margin of the right hepatic lobe.  The adrenal glands are both normal.  Normal appearance of the right kidney.  Multiple hypodensities are noted within the left kidney which may represent cysts.  Urinary bladder appears normal.  The prostate gland and seminal vesicles are unremarkable.  There is no adenopathy within the upper abdomen.  There is no pelvic or inguinal adenopathy.  The patient has a gastrostomy tube. The small bowel loops have a normal caliber.  The right lower quadrant ileostomy noted.  The patient is status post subtotal colectomy.  Review of the visualized bony structures is unremarkable.  IMPRESSION:  1.  There are two fluid collections within the abdomen. This likely accounts for the patient's persistent fever and leukocytosis.  The  first is in the left upper quadrant and extends around the spleen. The second encases the small bowel loops and extends along the anterior margin of the liver.  These should be amendable to percutaneous drainage under image guidance. 2.  Gastrostomy. 3.  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  Original Report Authenticated By: TKerby Moors M.D.    Ct Abdomen Pelvis W Contrast  05/28/2012  **ADDENDUM** CREATED: 05/28/2012 14:34:16  There is a typographical error in the first paragraph of the abdomen and pelvis portion of the report.  "This measures 4.7 x 810.3 by 9.5 cm."  This should read:   "  This measures 4.7 x 8.3 x 9.5 cm."  **END ADDENDUM** SIGNED BY: Angelita Ingles, M.D.   05/28/2012  *RADIOLOGY REPORT*  Clinical Data:  Fever and leukocytosis  CT CHEST, ABDOMEN AND PELVIS WITH CONTRAST  Technique:  Multidetector CT imaging of the chest, abdomen and pelvis was performed following the standard protocol during bolus administration of intravenous contrast.  Contrast: 140m OMNIPAQUE IOHEXOL 300 MG/ML  SOLN  Comparison:  CT abdomen and pelvis 04/24/2012.  CT CHEST  Findings:  There is a large left pleural effusion.  Moderate right pleural effusion is present.  Compressive type consolidation and atelectasis is noted within the left midlung.  The heart size is normal.  There is no pericardial effusion.  No enlarged lymph node within the mediastinum or hilar region.  Review of the visualized bony structures is unremarkable.  No worrisome lytic or sclerotic bone lesions.  IMPRESSION:  1.  Bilateral pleural effusions, left greater than right.  CT ABDOMEN AND PELVIS  Findings:  There is no focal liver abnormality.  Gallbladder appears normal.  No biliary dilatation.  The pancreas is unremarkable.  There is a large fluid collection overlying the spleen.  This measures 4.7 x 810.3 by 9.5 cm.  A second fluid collection encases the small bowel loops and extends into the right upper quadrant along the  anterior margin of the right hepatic lobe.  The adrenal glands are both normal.  Normal appearance of the right kidney.  Multiple hypodensities are noted within the left kidney which may represent cysts.  Urinary bladder appears normal.  The prostate gland and seminal vesicles are unremarkable.  There is no adenopathy within the upper abdomen.  There is no pelvic or inguinal adenopathy.  The patient has a gastrostomy tube. The small bowel loops have a normal caliber.  The right lower quadrant ileostomy noted.  The patient is status post subtotal colectomy.  Review of the visualized bony structures is unremarkable.  IMPRESSION:  1.  There are two fluid collections within the abdomen. This likely accounts for the patient's persistent fever and leukocytosis.  The first is in the left upper quadrant and extends around the spleen. The second encases the small bowel loops and extends along the anterior margin of the liver.  These should be amendable to percutaneous drainage under image guidance. 2.  Gastrostomy. 3.  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  Original Report Authenticated By: TKerby Moors M.D.    Recent Results (from the past 240 hour(s))  BODY FLUID CULTURE     Status: None   Collection Time    05/23/12 10:37 AM      Result Value Range Status   Specimen Description FLUID LEFT PLEURAL   Final   Special Requests Normal   Final   Gram Stain     Final   Value: RARE WBC PRESENT, PREDOMINANTLY PMN     NO ORGANISMS SEEN   Culture NO GROWTH 3 DAYS   Final   Report Status 05/26/2012 FINAL   Final  FUNGAL STAIN     Status: None   Collection Time    05/23/12 10:37 AM      Result Value Range Status   Specimen Description FLUID LEFT PLEURAL   Final   Special Requests Normal   Final   Fungal Smear NO YEAST OR FUNGAL ELEMENTS SEEN   Final   Report Status 05/24/2012 FINAL   Final      05/28/2012, 2:50 PM     LOS: 19 days

## 2012-05-29 ENCOUNTER — Inpatient Hospital Stay (HOSPITAL_COMMUNITY): Payer: BC Managed Care – PPO | Admitting: Occupational Therapy

## 2012-05-29 ENCOUNTER — Inpatient Hospital Stay (HOSPITAL_COMMUNITY): Payer: BC Managed Care – PPO

## 2012-05-29 ENCOUNTER — Inpatient Hospital Stay (HOSPITAL_COMMUNITY): Payer: BC Managed Care – PPO | Admitting: Physical Therapy

## 2012-05-29 ENCOUNTER — Ambulatory Visit (HOSPITAL_COMMUNITY): Payer: BC Managed Care – PPO | Admitting: *Deleted

## 2012-05-29 LAB — RETICULOCYTES
RBC.: 2.85 MIL/uL — ABNORMAL LOW (ref 4.22–5.81)
Retic Count, Absolute: 77 10*3/uL (ref 19.0–186.0)
Retic Ct Pct: 2.7 % (ref 0.4–3.1)

## 2012-05-29 LAB — CBC
HCT: 24.3 % — ABNORMAL LOW (ref 39.0–52.0)
MCH: 28.7 pg (ref 26.0–34.0)
MCV: 88.4 fL (ref 78.0–100.0)
RDW: 17.9 % — ABNORMAL HIGH (ref 11.5–15.5)
WBC: 14.6 10*3/uL — ABNORMAL HIGH (ref 4.0–10.5)

## 2012-05-29 LAB — COMPREHENSIVE METABOLIC PANEL
Albumin: 1.8 g/dL — ABNORMAL LOW (ref 3.5–5.2)
BUN: 20 mg/dL (ref 6–23)
CO2: 24 mEq/L (ref 19–32)
Calcium: 9.1 mg/dL (ref 8.4–10.5)
Chloride: 98 mEq/L (ref 96–112)
Creatinine, Ser: 1 mg/dL (ref 0.50–1.35)
GFR calc non Af Amer: 83 mL/min — ABNORMAL LOW (ref >90)
Total Bilirubin: 0.4 mg/dL (ref 0.3–1.2)

## 2012-05-29 LAB — MISCELLANEOUS TEST

## 2012-05-29 MED ORDER — CLONAZEPAM 0.5 MG PO TABS
0.5000 mg | ORAL_TABLET | Freq: Every day | ORAL | Status: DC
  Administered 2012-05-29 – 2012-06-05 (×8): 0.5 mg via ORAL
  Filled 2012-05-29 (×9): qty 1

## 2012-05-29 MED ORDER — ALPRAZOLAM 0.5 MG PO TABS: 0.5000 mg | ORAL_TABLET | ORAL | Status: AC

## 2012-05-29 MED ORDER — LORAZEPAM 0.5 MG PO TABS
0.5000 mg | ORAL_TABLET | Freq: Once | ORAL | Status: AC
  Administered 2012-05-29: 0.5 mg via ORAL
  Filled 2012-05-29: qty 1

## 2012-05-29 MED ORDER — DARBEPOETIN ALFA-POLYSORBATE 40 MCG/0.4ML IJ SOLN
40.0000 ug | INTRAMUSCULAR | Status: DC
  Administered 2012-05-29: 40 ug via SUBCUTANEOUS
  Filled 2012-05-29 (×2): qty 0.4

## 2012-05-29 MED ORDER — LORAZEPAM 2 MG/ML IJ SOLN: 0.5000 mg | Freq: Once | INTRAMUSCULAR | Status: AC

## 2012-05-29 NOTE — Progress Notes (Signed)
Awaiting aspiration of fluid collection for culture.  If sterile, would leave these alone.  Imogene Burn. Georgette Dover, MD, [facility] Surgery  General/ Trauma Surgery  05/29/2012 10:43 AM

## 2012-05-29 NOTE — Progress Notes (Signed)
PULMONARY  / CRITICAL CARE MEDICINE  Name: Connor Small MRN: [id number] DOB: Feb 10, 1960    ADMISSION DATE:  05/09/2012 CONSULTATION DATE:  4/30  REFERRING MD :  Riley Kill PRIMARY SERVICE:  rehab  CHIEF COMPLAINT:  Left effusion   BRIEF PATIENT DESCRIPTION:  3 YOM admitted to [facility] for Cdiff on 2/2. Underwent decompressive cecostomy 2/3, Hospital course complicated by ARDS, PNA, septic shock, bilateral cardioembolic CVAs, renal failure, wound dehiscence and required multiple OR visits. To  rehab, aspiration event 3/29-->treated, back to rehab. PCCM asked once again to see on 4/30 for on going low gd temp,  left effusion (which has been chronic since feb 2014, but has gotten a little worse).    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 Overlying bowel gas and patientclinical status. No explanation for elevated liver function tests.Similar to slight increase in size of a perisplenic fluidcollection.4. Right pleural effusion  3/28 - Called back to see on rehab for ? Asp  4/30 - L thora with 1750 orange tinged fluid removed, fluid c/w exudative process, no malignant cells, neg culture  LINES / TUBES:  CULTURES: UC 4/22 >> insig growth  Pleural Fluid 4/30 >>>no yeast / fungal, neg culture  ANTIBIOTICS: levaquin 4/22>>>5/4    SUBJECTIVE: Continues to have low grade fevers.  Pt reports he feels "so so, not bad / not good"  VITAL SIGNS: Temp:  [98.6 F (37 C)-100.4 F (38 C)] 100.4 F (38 C) (05/06 0539) Pulse Rate:  [114-120] 114 (05/06  0907) Resp:  [19] 19 (05/06 0539) BP: (93-102)/(48-64) 102/64 mmHg (05/06 0907) SpO2:  [98 %] 98 % (05/06 0539) Weight:  [135 lb 5.8 oz (61.4 kg)] 135 lb 5.8 oz (61.4 kg) (05/06 0539) Room air   PHYSICAL EXAMINATION: General:  Awake, oriented, chronically ill appearing but much stronger / improving Neuro:  Oriented. Generalized weakness w/ RUE weakness. Some short term mem def  HEENT:  [place], no JVD Cardiovascular:  reg Lungs:  Decreased left  Abdomen:  Mid abd dressing intact, ostomy draining. PEG unremark Musculoskeletal:  Generalized weakness but improved, no sig swelling    Recent Labs Lab 05/25/12 0610 05/28/12 0538 05/29/12 0610  NA 136 137 134*  K 4.0 4.5 4.1  CL 98 101 98  CO2 26 26 24   BUN 18 20 20   CREATININE 0.79 0.97 1.00  GLUCOSE 118* 99 91    Recent Labs Lab 05/25/12 0610 05/28/12 0538 05/29/12 0610  HGB 8.0* 7.8* 7.9*  HCT 24.8* 25.1* 24.3*  WBC 14.2* 15.0* 14.6*  PLT 535* 522* 527*   Ct Chest W Contrast  05/28/2012  **ADDENDUM** CREATED: 05/28/2012 14:34:16  There is a typographical error in the first paragraph of the abdomen and pelvis portion of the report.  "This measures 4.7 x 810.3 by 9.5 cm."  This should read:   "This measures 4.7 x 8.3 x 9.5 cm."  **END ADDENDUM** SIGNED BY: Rosealee Albee, M.D.   05/28/2012  *RADIOLOGY REPORT*  Clinical Data:  Fever and leukocytosis  CT CHEST,  ABDOMEN AND PELVIS WITH CONTRAST  Technique:  Multidetector CT imaging of the chest, abdomen and pelvis was performed following the standard protocol during bolus administration of intravenous contrast.  Contrast: OMNIPAQUE IOHEXOL 300 MG/ML  SOLN  Comparison:  CT abdomen and pelvis 04/24/2012.  CT CHEST  Findings:  There is a large left pleural effusion.  Moderate right pleural effusion is present.  Compressive type consolidation and atelectasis is noted within the left midlung.  The heart size is normal.  There is no pericardial effusion.  No enlarged lymph node within the  mediastinum or hilar region.  Review of the visualized bony structures is unremarkable.  No worrisome lytic or sclerotic bone lesions.  IMPRESSION:  1.  Bilateral pleural effusions, left greater than right.  CT ABDOMEN AND PELVIS  Findings:  There is no focal liver abnormality.  Gallbladder appears normal.  No biliary dilatation.  The pancreas is unremarkable.  There is a large fluid collection overlying the spleen.  This measures 4.7 x 810.3 by 9.5 cm.  A second fluid collection encases the small bowel loops and extends into the right upper quadrant along the anterior margin of the right hepatic lobe.  The adrenal glands are both normal.  Normal appearance of the right kidney.  Multiple hypodensities are noted within the left kidney which may represent cysts.  Urinary bladder appears normal.  The prostate gland and seminal vesicles are unremarkable.  There is no adenopathy within the upper abdomen.  There is no pelvic or inguinal adenopathy.  The patient has a gastrostomy tube. The small bowel loops have a normal caliber.  The right lower quadrant ileostomy noted.  The patient is status post subtotal colectomy.  Review of the visualized bony structures is unremarkable.  IMPRESSION:  1.  There are two fluid collections within the abdomen. This likely accounts for the patient's persistent fever and leukocytosis.  The first is in the left upper quadrant and extends around the spleen. The second encases the small bowel loops and extends along the anterior margin of the liver.  These should be amendable to percutaneous drainage under image guidance. 2.  Gastrostomy. 3.  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  Original Report Authenticated By: Signa Kell, M.D.    Ct Abdomen Pelvis W Contrast  05/28/2012  **ADDENDUM** CREATED: 05/28/2012 14:34:16  There is a typographical error in the first paragraph of the abdomen and pelvis portion of the report.  "This measures 4.7 x 810.3 by 9.5 cm."  This should  read:   "This measures 4.7 x 8.3 x 9.5 cm."  **END ADDENDUM** SIGNED BY: Rosealee Albee, M.D.   05/28/2012  *RADIOLOGY REPORT*  Clinical Data:  Fever and leukocytosis  CT CHEST, ABDOMEN AND PELVIS WITH CONTRAST  Technique:  Multidetector CT imaging of the chest, abdomen and pelvis was performed following the standard protocol during bolus administration of intravenous contrast.  Contrast: OMNIPAQUE IOHEXOL 300 MG/ML  SOLN  Comparison:  CT abdomen and pelvis 04/24/2012.  CT CHEST  Findings:  There is a large left pleural effusion.  Moderate right pleural effusion is present.  Compressive type consolidation and atelectasis is noted within the left midlung.  The heart size is normal.  There is no pericardial effusion.  No enlarged lymph node within the mediastinum or hilar region.  Review of the visualized bony structures is unremarkable.  No worrisome lytic or sclerotic bone lesions.  IMPRESSION:  1.  Bilateral pleural effusions, left greater than right.  CT ABDOMEN  AND PELVIS  Findings:  There is no focal liver abnormality.  Gallbladder appears normal.  No biliary dilatation.  The pancreas is unremarkable.  There is a large fluid collection overlying the spleen.  This measures 4.7 x 810.3 by 9.5 cm.  A second fluid collection encases the small bowel loops and extends into the right upper quadrant along the anterior margin of the right hepatic lobe.  The adrenal glands are both normal.  Normal appearance of the right kidney.  Multiple hypodensities are noted within the left kidney which may represent cysts.  Urinary bladder appears normal.  The prostate gland and seminal vesicles are unremarkable.  There is no adenopathy within the upper abdomen.  There is no pelvic or inguinal adenopathy.  The patient has a gastrostomy tube. The small bowel loops have a normal caliber.  The right lower quadrant ileostomy noted.  The patient is status post subtotal colectomy.  Review of the visualized bony structures is  unremarkable.  IMPRESSION:  1.  There are two fluid collections within the abdomen. This likely accounts for the patient's persistent fever and leukocytosis.  The first is in the left upper quadrant and extends around the spleen. The second encases the small bowel loops and extends along the anterior margin of the liver.  These should be amendable to percutaneous drainage under image guidance. 2.  Gastrostomy. 3.  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  Original Report Authenticated By: Signa Kell, M.D.    ASSESSMENT / PLAN: Left pleural effusion. Chronic since feb post PNA.  Repeat PNA in April with associated effusion s/p thora 4/30. Was exudate in past and again on 4/30.  Negative culture, neg fungal, & cytology.     Plan -completed abx 5/4 (levaquin) -CT Chest, ABD / Pelvis with fluid collections x2 in abd, most likely this accounts for fevers  -hold off on thora for now, wait to see if sympathetic and resolves -recommend outpatient pulmonary follow up post discharge.  -aggressive pulmonary hygiene -nutritional support    [name], NP-C [facility] Pgr: [phone] or [phone]   PCCM ATTENDING: I have interviewed and examined the patient and reviewed the database. I have formulated the assessment and plan as reflected in the note above with amendments made by me.   Await results of fluid analysis from procedure today. Consider thoracentesis depending on those results. Recheck CXR AM 5/7  Billy Fischer, MD;  PCCM service; Mobile [phone]

## 2012-05-29 NOTE — Progress Notes (Signed)
Occupational Therapy Note  Patient Details  Name: Connor Small MRN: [id number] Date of Birth: 03/05/1960 Today's Date: 05/29/2012  Time: 10-10:35am (75mn) Pt seen for 1:1 OT session. Pt supine in bed upon arrival, no pain reported. Pt's wife present at beginning of session. Worked on ASunGardof bil UE's including sh flexion, elbow flexion/extension, forearm supination/pronation, wrist flexion/extension and digit flexion/extension (digits of R hand only.) 1.5# weight used on LUE during exercises. Held stretch at end range of R digit flexion as DIPs have increased tightness. Ended session with 3 bedrails raised, bed alarm on and call bell within reach.   Brexley Cutshaw MCorlis Leak5/06/2012, 12:37 PM

## 2012-05-29 NOTE — Progress Notes (Signed)
Recreational Therapy Session Note  Patient Details  Name: Connor Small MRN: [id number] Date of Birth: 1961-01-10 Today's Date: 05/29/2012  Session cancelled-pt off the floor for procedure.  Mikahla Wisor 05/29/2012, 4:12 PM

## 2012-05-29 NOTE — Progress Notes (Signed)
Social Work Patient ID: Connor Small, male   DOB: 1960/07/08, 52 y.o.   MRN: [id number]  Have reviewed team conference with patient and wife.  Pleased that we continue to plan toward 5/14 d/c.  Family education ongoing.  Will need to begin ordering DME and f/u.  No concerns at this point from pt/ wife.  Shantrell Placzek, LCSW

## 2012-05-29 NOTE — Progress Notes (Signed)
MEDICATION RELATED CONSULT NOTE - INITIAL   Pharmacy Consult for aranesp Indication: anemia of chronic illness  Allergies  Allergen Reactions  . Penicillins Other (See Comments)    Heart rate changes  . Morphine And Related     Nausea/vomiting  . Oxycodone     Nausea/vomiting  . Imipenem Rash    Patient Measurements: Height: 5' 10"  (177.8 cm) Weight: 135 lb 5.8 oz (61.4 kg) IBW/kg (Calculated) : 73 Adjusted Body Weight:   Vital Signs: Temp: 100.4 F (38 C) (05/06 0539) Temp src: Oral (05/06 0539) BP: 102/64 mmHg (05/06 0907) Pulse Rate: 114 (05/06 0907) Intake/Output from previous day: 05/05 0701 - 05/06 0700 In: 1737 [P.O.:600; NG/GT:300] Out: 2250 [Urine:1175; Stool:1075] Intake/Output from this shift:    Labs:  Recent Labs  05/28/12 0538 05/29/12 0610  WBC 15.0* 14.6*  HGB 7.8* 7.9*  HCT 25.1* 24.3*  PLT 522* 527*  CREATININE 0.97 1.00  ALBUMIN 1.9* 1.8*  PROT 6.0 6.0  AST 17 18  ALT 13 14  ALKPHOS 125* 125*  BILITOT 0.4 0.4   Estimated Creatinine Clearance: 73.3 ml/min (by C-G formula based on Cr of 1).   Microbiology: Recent Results (from the past 720 hour(s))  URINE CULTURE     Status: None   Collection Time    05/01/12  5:30 PM      Result Value Range Status   Specimen Description URINE, CLEAN CATCH   Final   Special Requests NONE   Final   Culture  Setup Time 05/01/2012 18:41   Final   Colony Count NO GROWTH   Final   Culture NO GROWTH   Final   Report Status 05/02/2012 FINAL   Final  CLOSTRIDIUM DIFFICILE BY PCR     Status: None   Collection Time    05/03/12  5:10 AM      Result Value Range Status   C difficile by pcr NEGATIVE  NEGATIVE Final  URINE CULTURE     Status: None   Collection Time    05/15/12  8:32 AM      Result Value Range Status   Specimen Description URINE, CLEAN CATCH   Final   Special Requests NONE   Final   Culture  Setup Time 05/15/2012 10:30   Final   Colony Count 9,000 COLONIES/ML   Final   Culture  INSIGNIFICANT GROWTH   Final   Report Status 05/16/2012 FINAL   Final  BODY FLUID CULTURE     Status: None   Collection Time    05/23/12 10:37 AM      Result Value Range Status   Specimen Description FLUID LEFT PLEURAL   Final   Special Requests Normal   Final   Gram Stain     Final   Value: RARE WBC PRESENT, PREDOMINANTLY PMN     NO ORGANISMS SEEN   Culture NO GROWTH 3 DAYS   Final   Report Status 05/26/2012 FINAL   Final  FUNGAL STAIN     Status: None   Collection Time    05/23/12 10:37 AM      Result Value Range Status   Specimen Description FLUID LEFT PLEURAL   Final   Special Requests Normal   Final   Fungal Smear NO YEAST OR FUNGAL ELEMENTS SEEN   Final   Report Status 05/24/2012 FINAL   Final    Medical History: Past Medical History  Diagnosis Date  . Chronic diarrhea   . Rectal bleed   . Hemorrhoids   .  Ulcerative colitis     Distal UC over 8 yrs ago diagnosed  . Diverticulitis of large intestine with perforation 10/2011    done at [place]  . 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 history of UC (ulcerative colitis) 12/06/2010    Medications:  Scheduled:  . ALPRAZolam  0.5 mg Oral NOW  . aspirin  81 mg Per Tube Daily  . clonazePAM  0.5 mg Oral QHS  . feeding supplement  30 mL Per Tube QAC supper  . feeding supplement (VITAL 1.5 CAL)  237 mL Per Tube Custom  . ferrous sulfate  300 mg Per Tube BID WC  . free water  100 mL Per Tube Q3H  . lidocaine  1 patch Transdermal Q24H  . lip balm   Topical BID  . loperamide  2 mg Per Tube QHS  . loperamide  2-4 mg Per Tube UD  . LORazepam  0.5 mg Oral Once   Or  . LORazepam  0.5 mg Intramuscular Once  . metoCLOPramide  10 mg Oral TID AC  . metoprolol tartrate  6.25 mg Per Tube Daily  . multivitamin  5 mL Per Tube Daily  . pantoprazole sodium  40 mg Per Tube Q1200  . saccharomyces boulardii  250 mg Oral BID  . [DISCONTINUED] chlorhexidine  15 mL Mouth Rinse BID  . [DISCONTINUED] clonazePAM  0.75 mg Oral QHS  .  [DISCONTINUED] feeding supplement (VITAL 1.5 CAL)  237 mL Per Tube Custom   Infusions:    Assessment: Patient has anemia of chronic disease; he had 4 weekly dose of Aranesp few weeks ago.  Hgb hasn't improved much; also received 4 daily doses of IV dextran (started on 04/28) due to the original iron stores were low. Retic count was originally 61.7.  Medical team wants to continue aranesp. Hgb 7.9 on 05/06.  4/8 Aranesp 40 @ 1720  4/15 Aranesp 40 @ 2044  4/22 Aranesp 40 @ 2029  4/29 Aranesp 40 @ 1845  Goal of Therapy:  Hgb increase of at least 1 gm/dL after 4 weeks of Aranesp   Plan:  -Restart Aranesp 40 mcg every Tuesdays (1st dose 05/29/12) x 4 doses per medical team's request -Check CBC and Reticulocyte count at least weekly while on Aranesp  -Iron panel and retic today     Kylia Grajales, Tsz-Yin 05/29/2012,2:25 PM

## 2012-05-29 NOTE — Progress Notes (Signed)
Patient ID: Connor Small, male   DOB: 1960-10-13, 52 y.o.   MRN: [id number]    Subjective: Pt feels well this morning.  No abdominal pain.  Objective: Vital signs in last 24 hours: Temp:  [98.6 F (37 C)-100.4 F (38 C)] 100.4 F (38 C) (05/06 0539) Pulse Rate:  [120] 120 (05/06 0539) Resp:  [19] 19 (05/06 0539) BP: (93)/(48) 93/48 mmHg (05/06 0539) SpO2:  [98 %] 98 % (05/06 0539) Weight:  [135 lb 5.8 oz (61.4 kg)] 135 lb 5.8 oz (61.4 kg) (05/06 0539) Last BM Date: 05/28/12  Intake/Output from previous day: 05/05 0701 - 05/06 0700 In: 1737 [P.O.:600; NG/GT:300] Out: 2250 [Urine:1175; Stool:1075] Intake/Output this shift:    PE: Abd: soft, NT, ND, +BS, wound is pretty close to completely healed.  Has some fascial sutures protruding through skin.  Ostomy working well with good output  Lab Results:   Recent Labs  05/28/12 0538 05/29/12 0610  WBC 15.0* 14.6*  HGB 7.8* 7.9*  HCT 25.1* 24.3*  PLT 522* 527*   BMET  Recent Labs  05/28/12 0538 05/29/12 0610  NA 137 134*  K 4.5 4.1  CL 101 98  CO2 26 24  GLUCOSE 99 91  BUN 20 20  CREATININE 0.97 1.00  CALCIUM 9.2 9.1   PT/INR No results found for this basename: LABPROT, INR,  in the last 72 hours CMP     Component Value Date/Time   NA 134* 05/29/2012 0610   K 4.1 05/29/2012 0610   CL 98 05/29/2012 0610   CO2 24 05/29/2012 0610   GLUCOSE 91 05/29/2012 0610   BUN 20 05/29/2012 0610   CREATININE 1.00 05/29/2012 0610   CALCIUM 9.1 05/29/2012 0610   PROT 6.0 05/29/2012 0610   ALBUMIN 1.8* 05/29/2012 0610   AST 18 05/29/2012 0610   ALT 14 05/29/2012 0610   ALKPHOS 125* 05/29/2012 0610   BILITOT 0.4 05/29/2012 0610   GFRNONAA 83* 05/29/2012 0610   GFRAA >90 05/29/2012 0610   Lipase     Component Value Date/Time   LIPASE 34 04/02/2012 0424       Studies/Results: Ct Chest W Contrast  05/28/2012  **ADDENDUM** CREATED: 05/28/2012 14:34:16  There is a typographical error in the first paragraph of the abdomen and pelvis portion of the  report.  "This measures 4.7 x 810.3 by 9.5 cm."  This should read:   "This measures 4.7 x 8.3 x 9.5 cm."  **END ADDENDUM** SIGNED BY: Rosealee Albee, M.D.   05/28/2012  *RADIOLOGY REPORT*  Clinical Data:  Fever and leukocytosis  CT CHEST, ABDOMEN AND PELVIS WITH CONTRAST  Technique:  Multidetector CT imaging of the chest, abdomen and pelvis was performed following the standard protocol during bolus administration of intravenous contrast.  Contrast: OMNIPAQUE IOHEXOL 300 MG/ML  SOLN  Comparison:  CT abdomen and pelvis 04/24/2012.  CT CHEST  Findings:  There is a large left pleural effusion.  Moderate right pleural effusion is present.  Compressive type consolidation and atelectasis is noted within the left midlung.  The heart size is normal.  There is no pericardial effusion.  No enlarged lymph node within the mediastinum or hilar region.  Review of the visualized bony structures is unremarkable.  No worrisome lytic or sclerotic bone lesions.  IMPRESSION:  1.  Bilateral pleural effusions, left greater than right.  CT ABDOMEN AND PELVIS  Findings:  There is no focal liver abnormality.  Gallbladder appears normal.  No biliary dilatation.  The pancreas  is unremarkable.  There is a large fluid collection overlying the spleen.  This measures 4.7 x 810.3 by 9.5 cm.  A second fluid collection encases the small bowel loops and extends into the right upper quadrant along the anterior margin of the right hepatic lobe.  The adrenal glands are both normal.  Normal appearance of the right kidney.  Multiple hypodensities are noted within the left kidney which may represent cysts.  Urinary bladder appears normal.  The prostate gland and seminal vesicles are unremarkable.  There is no adenopathy within the upper abdomen.  There is no pelvic or inguinal adenopathy.  The patient has a gastrostomy tube. The small bowel loops have a normal caliber.  The right lower quadrant ileostomy noted.  The patient is status post subtotal  colectomy.  Review of the visualized bony structures is unremarkable.  IMPRESSION:  1.  There are two fluid collections within the abdomen. This likely accounts for the patient's persistent fever and leukocytosis.  The first is in the left upper quadrant and extends around the spleen. The second encases the small bowel loops and extends along the anterior margin of the liver.  These should be amendable to percutaneous drainage under image guidance. 2.  Gastrostomy. 3.  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  Original Report Authenticated By: Signa Kell, M.D.    Ct Abdomen Pelvis W Contrast  05/28/2012  **ADDENDUM** CREATED: 05/28/2012 14:34:16  There is a typographical error in the first paragraph of the abdomen and pelvis portion of the report.  "This measures 4.7 x 810.3 by 9.5 cm."  This should read:   "This measures 4.7 x 8.3 x 9.5 cm."  **END ADDENDUM** SIGNED BY: Rosealee Albee, M.D.   05/28/2012  *RADIOLOGY REPORT*  Clinical Data:  Fever and leukocytosis  CT CHEST, ABDOMEN AND PELVIS WITH CONTRAST  Technique:  Multidetector CT imaging of the chest, abdomen and pelvis was performed following the standard protocol during bolus administration of intravenous contrast.  Contrast: OMNIPAQUE IOHEXOL 300 MG/ML  SOLN  Comparison:  CT abdomen and pelvis 04/24/2012.  CT CHEST  Findings:  There is a large left pleural effusion.  Moderate right pleural effusion is present.  Compressive type consolidation and atelectasis is noted within the left midlung.  The heart size is normal.  There is no pericardial effusion.  No enlarged lymph node within the mediastinum or hilar region.  Review of the visualized bony structures is unremarkable.  No worrisome lytic or sclerotic bone lesions.  IMPRESSION:  1.  Bilateral pleural effusions, left greater than right.  CT ABDOMEN AND PELVIS  Findings:  There is no focal liver abnormality.  Gallbladder appears normal.  No biliary dilatation.  The pancreas is  unremarkable.  There is a large fluid collection overlying the spleen.  This measures 4.7 x 810.3 by 9.5 cm.  A second fluid collection encases the small bowel loops and extends into the right upper quadrant along the anterior margin of the right hepatic lobe.  The adrenal glands are both normal.  Normal appearance of the right kidney.  Multiple hypodensities are noted within the left kidney which may represent cysts.  Urinary bladder appears normal.  The prostate gland and seminal vesicles are unremarkable.  There is no adenopathy within the upper abdomen.  There is no pelvic or inguinal adenopathy.  The patient has a gastrostomy tube. The small bowel loops have a normal caliber.  The right lower quadrant ileostomy noted.  The patient is status post  subtotal colectomy.  Review of the visualized bony structures is unremarkable.  IMPRESSION:  1.  There are two fluid collections within the abdomen. This likely accounts for the patient's persistent fever and leukocytosis.  The first is in the left upper quadrant and extends around the spleen. The second encases the small bowel loops and extends along the anterior margin of the liver.  These should be amendable to percutaneous drainage under image guidance. 2.  Gastrostomy. 3.  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  Original Report Authenticated By: Signa Kell, M.D.     Anti-infectives: Anti-infectives   Start     Dose/Rate Route Frequency Ordered Stop   05/26/12 0800  fluconazole (DIFLUCAN) 40 MG/ML suspension 100 mg  Status:  Discontinued     100 mg Oral Daily 05/25/12 0655 05/25/12 0656   05/25/12 0700  fluconazole (DIFLUCAN) 40 MG/ML suspension 200 mg  Status:  Discontinued     200 mg Oral  Once 05/25/12 0655 05/25/12 0656   05/16/12 1800  levofloxacin (LEVAQUIN) tablet 750 mg  Status:  Discontinued     750 mg Oral Daily-1800 05/16/12 1551 05/27/12 0655   05/15/12 0830  ciprofloxacin (CIPRO) tablet 500 mg  Status:  Discontinued     Comments:  Begin after urine sample is collected   500 mg Oral 2 times daily 05/15/12 0740 05/16/12 1551       Assessment/Plan  1. Splenic and hepatic fluid collections 2. Leukocytosis 3. Low grade fever  Plan: 1. NPO 2. Will see if IR can aspirate these fluid collections and send for culture to rule this out as a possibility for the cause of his WBC and fever.   3. Will follow.   LOS: 20 days    Aayana Reinertsen E 05/29/2012, 8:50 AM Pager: [phone]

## 2012-05-29 NOTE — Progress Notes (Signed)
Pt went to ultrasound, ativan given prior , pt returned to unit VSS, pt sleeping denies pain

## 2012-05-29 NOTE — Progress Notes (Signed)
Physical Therapy Session Note  Patient Details  Name: Connor Small MRN: [id number] Date of Birth: 22-Jun-1960  Today's Date: 05/29/2012 Time: 2111-7356 Time Calculation (min): 39 min  Short Term Goals: Week 3:  PT Short Term Goal 1 (Week 3): Pt will be able to maintain standing x 1 min with +2 assist to aid with postural control and LE strengthening PT Short Term Goal 2 (Week 3): Pt will be able to complete sit to stand from elevated surface 2/3 trials with max A +2 PT Short Term Goal 3 (Week 3): Pt's wife will be able to demonstrate safe slide board transfer/set-up with min verbal cues to prepare for d/c home  Skilled Therapeutic Interventions/Progress Updates:   Patient just returned from abdominal procedure; extremely lethargic from medication and required extra time and verbal cues to arouse.  Patient requested to stay in bed but agreed to in bed therapy.  Performed bilat LE and UE gravity minimized AAROM strengthening and coordination exercises with 10-12 reps each: ankle pumps, alternating heels slides, hip ABD/ADD, glute sets, SAQ, SLR and UE scap retraction, isometric latissimus activation, bilat UE PNF D2 flexion <> extension with trunk rotation.  Pt awake by end of session.  Assisted wife with repositioning patient to [facility] and positioning pillows for support and pressure relief.  Wife did voice questions about when equipment for home would be ordered and delivered and when they would begin car transfer training.  Deferred car transfer questions to primary PT.    Therapy Documentation Precautions:  Precautions Precautions: Fall;Shoulder Type of Shoulder Precautions: right shoulder subluxation  Precaution Comments:  ostomy, tachycardia, Afib, peg tube (doesn't wear abd binder anymore) Required Braces or Orthoses: Other Brace/Splint Other Brace/Splint: right wrist splint Restrictions Weight Bearing Restrictions: No Vital Signs: Therapy Vitals Temp: 98.2 F (36.8 C) Temp src:  Oral Pulse Rate: 115 Resp: 16 BP: 93/59 mmHg Patient Position, if appropriate: Lying Oxygen Therapy SpO2: 98 % O2 Device: None (Room air) Pain: Pain Assessment Pain Assessment: No/denies pain  See FIM for current functional status  Therapy/Group: Individual Therapy  [name] [facility] 05/29/2012, 4:38 PM

## 2012-05-29 NOTE — Progress Notes (Signed)
Subjective/Complaints: Persistent low grade temp. Sleeping comfortably this am A 12 point review of systems has been performed and if not noted above is otherwise negative.   Objective: Vital Signs: Blood pressure 93/48, pulse 120, temperature 100.4 F (38 C), temperature source Oral, resp. rate 19, height 5' 10"  (1.778 m), weight 61.4 kg (135 lb 5.8 oz), SpO2 98.00%. Ct Chest W Contrast  05/28/2012  **ADDENDUM** CREATED: 05/28/2012 14:34:16  There is a typographical error in the first paragraph of the abdomen and pelvis portion of the report.  "This measures 4.7 x 810.3 by 9.5 cm."  This should read:   "This measures 4.7 x 8.3 x 9.5 cm."  **END ADDENDUM** SIGNED BY: Angelita Ingles, M.D.   05/28/2012  *RADIOLOGY REPORT*  Clinical Data:  Fever and leukocytosis  CT CHEST, ABDOMEN AND PELVIS WITH CONTRAST  Technique:  Multidetector CT imaging of the chest, abdomen and pelvis was performed following the standard protocol during bolus administration of intravenous contrast.  Contrast: 15m OMNIPAQUE IOHEXOL 300 MG/ML  SOLN  Comparison:  CT abdomen and pelvis 04/24/2012.  CT CHEST  Findings:  There is a large left pleural effusion.  Moderate right pleural effusion is present.  Compressive type consolidation and atelectasis is noted within the left midlung.  The heart size is normal.  There is no pericardial effusion.  No enlarged lymph node within the mediastinum or hilar region.  Review of the visualized bony structures is unremarkable.  No worrisome lytic or sclerotic bone lesions.  IMPRESSION:  1.  Bilateral pleural effusions, left greater than right.  CT ABDOMEN AND PELVIS  Findings:  There is no focal liver abnormality.  Gallbladder appears normal.  No biliary dilatation.  The pancreas is unremarkable.  There is a large fluid collection overlying the spleen.  This measures 4.7 x 810.3 by 9.5 cm.  A second fluid collection encases the small bowel loops and extends into the right upper quadrant along the  anterior margin of the right hepatic lobe.  The adrenal glands are both normal.  Normal appearance of the right kidney.  Multiple hypodensities are noted within the left kidney which may represent cysts.  Urinary bladder appears normal.  The prostate gland and seminal vesicles are unremarkable.  There is no adenopathy within the upper abdomen.  There is no pelvic or inguinal adenopathy.  The patient has a gastrostomy tube. The small bowel loops have a normal caliber.  The right lower quadrant ileostomy noted.  The patient is status post subtotal colectomy.  Review of the visualized bony structures is unremarkable.  IMPRESSION:  1.  There are two fluid collections within the abdomen. This likely accounts for the patient's persistent fever and leukocytosis.  The first is in the left upper quadrant and extends around the spleen. The second encases the small bowel loops and extends along the anterior margin of the liver.  These should be amendable to percutaneous drainage under image guidance. 2.  Gastrostomy. 3.  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  Original Report Authenticated By: TKerby Moors M.D.    Ct Abdomen Pelvis W Contrast  05/28/2012  **ADDENDUM** CREATED: 05/28/2012 14:34:16  There is a typographical error in the first paragraph of the abdomen and pelvis portion of the report.  "This measures 4.7 x 810.3 by 9.5 cm."  This should read:   "This measures 4.7 x 8.3 x 9.5 cm."  **END ADDENDUM** SIGNED BY: TAngelita Ingles M.D.   05/28/2012  *RADIOLOGY REPORT*  Clinical Data:  Fever  and leukocytosis  CT CHEST, ABDOMEN AND PELVIS WITH CONTRAST  Technique:  Multidetector CT imaging of the chest, abdomen and pelvis was performed following the standard protocol during bolus administration of intravenous contrast.  Contrast: 146m OMNIPAQUE IOHEXOL 300 MG/ML  SOLN  Comparison:  CT abdomen and pelvis 04/24/2012.  CT CHEST  Findings:  There is a large left pleural effusion.  Moderate right pleural  effusion is present.  Compressive type consolidation and atelectasis is noted within the left midlung.  The heart size is normal.  There is no pericardial effusion.  No enlarged lymph node within the mediastinum or hilar region.  Review of the visualized bony structures is unremarkable.  No worrisome lytic or sclerotic bone lesions.  IMPRESSION:  1.  Bilateral pleural effusions, left greater than right.  CT ABDOMEN AND PELVIS  Findings:  There is no focal liver abnormality.  Gallbladder appears normal.  No biliary dilatation.  The pancreas is unremarkable.  There is a large fluid collection overlying the spleen.  This measures 4.7 x 810.3 by 9.5 cm.  A second fluid collection encases the small bowel loops and extends into the right upper quadrant along the anterior margin of the right hepatic lobe.  The adrenal glands are both normal.  Normal appearance of the right kidney.  Multiple hypodensities are noted within the left kidney which may represent cysts.  Urinary bladder appears normal.  The prostate gland and seminal vesicles are unremarkable.  There is no adenopathy within the upper abdomen.  There is no pelvic or inguinal adenopathy.  The patient has a gastrostomy tube. The small bowel loops have a normal caliber.  The right lower quadrant ileostomy noted.  The patient is status post subtotal colectomy.  Review of the visualized bony structures is unremarkable.  IMPRESSION:  1.  There are two fluid collections within the abdomen. This likely accounts for the patient's persistent fever and leukocytosis.  The first is in the left upper quadrant and extends around the spleen. The second encases the small bowel loops and extends along the anterior margin of the liver.  These should be amendable to percutaneous drainage under image guidance. 2.  Gastrostomy. 3.  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  Original Report Authenticated By: TKerby Moors M.D.     Recent Labs  05/28/12 0538  05/29/12 0610  WBC 15.0* 14.6*  HGB 7.8* 7.9*  HCT 25.1* 24.3*  PLT 522* 527*    Recent Labs  05/28/12 0538  NA 137  K 4.5  CL 101  GLUCOSE 99  BUN 20  CREATININE 0.97  CALCIUM 9.2   CBG (last 3)   Recent Labs  05/26/12 0739 05/26/12 1132 05/26/12 1800  GLUCAP 82 89 83    Wt Readings from Last 3 Encounters:  05/29/12 61.4 kg (135 lb 5.8 oz)  05/07/12 66.2 kg (145 lb 15.1 oz)  04/18/12 71.94 kg (158 lb 9.6 oz)    Physical Exam:  Constitutional: He appears well-developed. He has a sickly appearance.   HENT:  Head: Normocephalic and atraumatic.  Eyes: Pupils are equal, round, and reactive to light.  Neck: Normal range of motion.  Cardiovascular: Regular rhythm. Tachycardia present. .  Pulmonary/Chest: Effort normal. No respiratory distress.  He has no wheezes. Decreased sounds at the bases. Abdominal: He exhibits minimal distension. Bowel sounds are increased. There is tenderness.  Midline incision with retention sutures in place--incision nearly fully granulated in. Neurological: He is alert.  Basic insight and awareness. Was able to follow  basic commands. Hoarse voice. 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 UE grossly  2to 3/5 LUE, 1-2/5 RUE with contracture in Hand (limited flexion). LE 1 to 2+/5 with weakness more in ankle dorsiflexion. DTR's 1+. Diffuse muscle wasting persistent.  Skin: Skin is warm and dry.  Psychiatric: relaxed  Assessment/Plan:  1. 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 Physiatrist is providing close team supervision and 24 hour management of active medical problems listed below. Physiatrist and rehab team continue to assess barriers to discharge/monitor patient progress toward functional and medical goals.    FIM: FIM - Bathing Bathing Steps Patient  Completed: Chest;Right Arm;Abdomen;Front perineal area;Right upper leg;Left upper leg Bathing: 3: Mod-Patient completes 5-7 [id number] parts or 50-74% (supine for LB in w/c for upper body)  FIM - Upper Body Dressing/Undressing Upper body dressing/undressing steps patient completed: Thread/unthread right sleeve of pullover shirt/dresss;Thread/unthread left sleeve of pullover shirt/dress;Put head through opening of pull over shirt/dress;Pull shirt over trunk Upper body dressing/undressing: 5: Supervision: Safety issues/verbal cues FIM - Lower Body Dressing/Undressing Lower body dressing/undressing steps patient completed: Thread/unthread right pants leg;Thread/unthread left pants leg Lower body dressing/undressing: 2: Max-Patient completed 25-49% of tasks  FIM - Toileting Toileting: 0: Activity did not occur  FIM - TAir cabin crewTransfers: 0-Activity did not occur  FIM - BControl and instrumentation engineerDevices: Arm rests;Sliding board Bed/Chair Transfer: 3: Bed > Chair or W/C: Mod A (lift or lower assist);2: Chair or W/C > Bed: Max A (lift and lower assist)  FIM - Locomotion: Wheelchair Locomotion: Wheelchair: 2: Travels 50 - 149 ft with supervision, cueing or coaxing FIM - Locomotion: Ambulation Ambulation/Gait Assistance: Not tested (comment) Locomotion: Ambulation: 0: Activity did not occur  Comprehension Comprehension Mode: Auditory Comprehension: 6-Follows complex conversation/direction: With extra time/assistive device  Expression Expression Mode: Verbal Expression: 5-Expresses complex 90% of the time/cues < 10% of the time  Social Interaction Social Interaction Mode: Not assessed Social Interaction: 5-Interacts appropriately 90% of the time - Needs monitoring or encouragement for participation or interaction.  Problem Solving Problem Solving Mode: Not assessed Problem Solving: 5-Solves complex 90% of the time/cues < 10% of the time  Memory Memory  Mode: Not assessed Memory: 5-Recognizes or recalls 90% of the time/requires cueing < 10% of the time  Medical Problem List and Plan:  1. DVT Prophylaxis/Anticoagulation: Mechanical: Sequential compression devices, below knee Bilateral lower extremities thrombocytopenia resolved.   2. Multifactorial pain: tylenol, local measures, use ultram for more severe pain, HA 3. Mood: neuropsych consult requested. Needs encouragement  -klonopin change to 0.573mqhs 4. Neuropsych: This patient is not capable of making decisions on his/her own behalf.  5. FEN::   Serial labs, adjusting fluids, formula as well. Potassium slightly elevated--recheck Monday  -bolus feed trial, with po attempts as well 6. Bilateral cardioembolic CVA: no blood thinners due to issues with bleeding 7. ABLA:  Aranesp, iv fe++ completed/ aranesp again this week  -consider transfusion  -Recheck iron studies later this week  -serial cbc's- consider transfusion  -no signs of overt blood loss  -see below 8. 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 9. Low grade temp/leukocytosis-   -per-splenic and small bowel fluid collections, large left pleural fluid collection again  -WBC's slowly increasing (15k)  -holding further abx pending aspirations of abdominal  fluid  -appreciated pulmonary/ccs/ID/surgery assist  -pt denies feelling poorly  -IR to tap fluid collections in abdomen  LOS (Days) 20 A FACE TO FACE EVALUATION WAS PERFORMED  Jessyka Austria T 05/29/2012 7:29 AM

## 2012-05-29 NOTE — Progress Notes (Signed)
INFECTIOUS DISEASE PROGRESS NOTE  ID: Connor Small is a 52 y.o. 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   Coagulopathy   Acute blood loss anemia   Ileostomy in place   Ascites s/p US guide paracentesis (4/2)  Subjective: Without complaints, in wheelchair going to gift shop  Abtx:  Anti-infectives   Start     Dose/Rate Route Frequency Ordered Stop   05/26/12 0800  fluconazole (DIFLUCAN) 40 MG/ML suspension 100 mg  Status:  Discontinued     100 mg Oral Daily 05/25/12 0655 05/25/12 0656   05/25/12 0700  fluconazole (DIFLUCAN) 40 MG/ML suspension 200 mg  Status:  Discontinued     200 mg Oral  Once 05/25/12 0655 05/25/12 0656   05/16/12 1800  levofloxacin (LEVAQUIN) tablet 750 mg  Status:  Discontinued     750 mg Oral Daily-1800 05/16/12 1551 05/27/12 0655   05/15/12 0830  ciprofloxacin (CIPRO) tablet 500 mg  Status:  Discontinued    Comments:  Begin after urine sample is collected   500 mg Oral 2 times daily 05/15/12 0740 05/16/12 1551      Medications:  Scheduled: . aspirin  81 mg Per Tube Daily  . clonazePAM  0.5 mg Oral QHS  . feeding supplement  30 mL Per Tube QAC supper  . feeding supplement (VITAL 1.5 CAL)  237 mL Per Tube Custom  . ferrous sulfate  300 mg Per Tube BID WC  . free water  100 mL Per Tube Q3H  . lidocaine  1 patch Transdermal Q24H  . lip balm   Topical BID  . loperamide  2 mg Per Tube QHS  . loperamide  2-4 mg Per Tube UD  . metoCLOPramide  10 mg Oral TID AC  . metoprolol tartrate  6.25 mg Per Tube Daily  . multivitamin  5 mL Per Tube Daily  . pantoprazole sodium  40 mg Per Tube Q1200  . saccharomyces boulardii  250 mg Oral BID    Objective: Vital signs in last 24 hours: Temp:  [98.6 F (37 C)-100.4 F (38 C)] 100.4 F (38 C) (05/06 0539) Pulse Rate:  [114-120] 114 (05/06  0907) Resp:  [19] 19 (05/06 0539) BP: (93-102)/(48-64) 102/64 mmHg (05/06 0907) SpO2:  [98 %] 98 % (05/06 0539) Weight:  [61.4 kg (135 lb 5.8 oz)] 61.4 kg (135 lb 5.8 oz) (05/06 0539)   General appearance: alert, cooperative and no distress  Lab Results  Recent Labs  05/28/12 0538 05/29/12 0610  WBC 15.0* 14.6*  HGB 7.8* 7.9*  HCT 25.1* 24.3*  NA 137 134*  K 4.5 4.1  CL 101 98  CO2 26 24  BUN 20 20  CREATININE 0.97 1.00   Liver Panel  Recent Labs  05/28/12 0538 05/29/12 0610  PROT 6.0 6.0  ALBUMIN 1.9* 1.8*  AST 17 18  ALT 13 14  ALKPHOS 125* 125*  BILITOT 0.4 0.4   Sedimentation Rate No results found for this basename: ESRSEDRATE,  in the last 72 hours C-Reactive Protein No results found for this basename: CRP,  in the last 72 hours  Microbiology: Recent Results (from the past 240 hour(s))  BODY FLUID CULTURE     Status: None   Collection Time    05/23/12 10:37 AM      Result Value Range Status  Specimen Description FLUID LEFT PLEURAL   Final   Special Requests Normal   Final   Gram Stain     Final   Value: RARE WBC PRESENT, PREDOMINANTLY PMN     NO ORGANISMS SEEN   Culture NO GROWTH 3 DAYS   Final   Report Status 05/26/2012 FINAL   Final  FUNGAL STAIN     Status: None   Collection Time    05/23/12 10:37 AM      Result Value Range Status   Specimen Description FLUID LEFT PLEURAL   Final   Special Requests Normal   Final   Fungal Smear NO YEAST OR FUNGAL ELEMENTS SEEN   Final   Report Status 05/24/2012 FINAL   Final    Studies/Results: Ct Chest W Contrast  05/28/2012  **ADDENDUM** CREATED: 05/28/2012 14:34:16  There is a typographical error in the first paragraph of the abdomen and pelvis portion of the report.  "This measures 4.7 x 810.3 by 9.5 cm."  This should read:   "This measures 4.7 x 8.3 x 9.5 cm."  **END ADDENDUM** SIGNED BY: Angelita Ingles, M.D.   05/28/2012  *RADIOLOGY REPORT*  Clinical Data:  Fever and leukocytosis  CT CHEST, ABDOMEN AND  PELVIS WITH CONTRAST  Technique:  Multidetector CT imaging of the chest, abdomen and pelvis was performed following the standard protocol during bolus administration of intravenous contrast.  Contrast: 156m OMNIPAQUE IOHEXOL 300 MG/ML  SOLN  Comparison:  CT abdomen and pelvis 04/24/2012.  CT CHEST  Findings:  There is a large left pleural effusion.  Moderate right pleural effusion is present.  Compressive type consolidation and atelectasis is noted within the left midlung.  The heart size is normal.  There is no pericardial effusion.  No enlarged lymph node within the mediastinum or hilar region.  Review of the visualized bony structures is unremarkable.  No worrisome lytic or sclerotic bone lesions.  IMPRESSION:  1.  Bilateral pleural effusions, left greater than right.  CT ABDOMEN AND PELVIS  Findings:  There is no focal liver abnormality.  Gallbladder appears normal.  No biliary dilatation.  The pancreas is unremarkable.  There is a large fluid collection overlying the spleen.  This measures 4.7 x 810.3 by 9.5 cm.  A second fluid collection encases the small bowel loops and extends into the right upper quadrant along the anterior margin of the right hepatic lobe.  The adrenal glands are both normal.  Normal appearance of the right kidney.  Multiple hypodensities are noted within the left kidney which may represent cysts.  Urinary bladder appears normal.  The prostate gland and seminal vesicles are unremarkable.  There is no adenopathy within the upper abdomen.  There is no pelvic or inguinal adenopathy.  The patient has a gastrostomy tube. The small bowel loops have a normal caliber.  The right lower quadrant ileostomy noted.  The patient is status post subtotal colectomy.  Review of the visualized bony structures is unremarkable.  IMPRESSION:  1.  There are two fluid collections within the abdomen. This likely accounts for the patient's persistent fever and leukocytosis.  The first is in the left upper quadrant  and extends around the spleen. The second encases the small bowel loops and extends along the anterior margin of the liver.  These should be amendable to percutaneous drainage under image guidance. 2.  Gastrostomy. 3.  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  Original Report Authenticated By: TKerby Moors M.D.    Ct Abdomen Pelvis  W Contrast  05/28/2012  **ADDENDUM** CREATED: 05/28/2012 14:34:16  There is a typographical error in the first paragraph of the abdomen and pelvis portion of the report.  "This measures 4.7 x 810.3 by 9.5 cm."  This should read:   "This measures 4.7 x 8.3 x 9.5 cm."  **END ADDENDUM** SIGNED BY: Angelita Ingles, M.D.   05/28/2012  *RADIOLOGY REPORT*  Clinical Data:  Fever and leukocytosis  CT CHEST, ABDOMEN AND PELVIS WITH CONTRAST  Technique:  Multidetector CT imaging of the chest, abdomen and pelvis was performed following the standard protocol during bolus administration of intravenous contrast.  Contrast: 119m OMNIPAQUE IOHEXOL 300 MG/ML  SOLN  Comparison:  CT abdomen and pelvis 04/24/2012.  CT CHEST  Findings:  There is a large left pleural effusion.  Moderate right pleural effusion is present.  Compressive type consolidation and atelectasis is noted within the left midlung.  The heart size is normal.  There is no pericardial effusion.  No enlarged lymph node within the mediastinum or hilar region.  Review of the visualized bony structures is unremarkable.  No worrisome lytic or sclerotic bone lesions.  IMPRESSION:  1.  Bilateral pleural effusions, left greater than right.  CT ABDOMEN AND PELVIS  Findings:  There is no focal liver abnormality.  Gallbladder appears normal.  No biliary dilatation.  The pancreas is unremarkable.  There is a large fluid collection overlying the spleen.  This measures 4.7 x 810.3 by 9.5 cm.  A second fluid collection encases the small bowel loops and extends into the right upper quadrant along the anterior margin of the right hepatic  lobe.  The adrenal glands are both normal.  Normal appearance of the right kidney.  Multiple hypodensities are noted within the left kidney which may represent cysts.  Urinary bladder appears normal.  The prostate gland and seminal vesicles are unremarkable.  There is no adenopathy within the upper abdomen.  There is no pelvic or inguinal adenopathy.  The patient has a gastrostomy tube. The small bowel loops have a normal caliber.  The right lower quadrant ileostomy noted.  The patient is status post subtotal colectomy.  Review of the visualized bony structures is unremarkable.  IMPRESSION:  1.  There are two fluid collections within the abdomen. This likely accounts for the patient's persistent fever and leukocytosis.  The first is in the left upper quadrant and extends around the spleen. The second encases the small bowel loops and extends along the anterior margin of the liver.  These should be amendable to percutaneous drainage under image guidance. 2.  Gastrostomy. 3.  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  Original Report Authenticated By: TKerby Moors M.D.      Assessment/Plan: Fever (low grade), Leukocytosis Intra-abdominal fluid collections  To have u/s guided aspirate today.  Await results prior to restarting anbx  Total days of antibiotics: off  levaquin 4-23 ---->5-3  vanco (po) 3-29 ----> 4-5  Aztreonam 3-29 ----> 4-2   JBobby RumpfInfectious Diseases [id number]/06/2012, 11:35 AM   LOS: 20 days

## 2012-05-29 NOTE — Procedures (Signed)
US guided diagnostic paracentesis performed yielding 6 cc's clear, yellow fluid. The fluid was sent to the lab for culture/sensitivity. No immediate complications.

## 2012-05-29 NOTE — Progress Notes (Signed)
Occupational Therapy Session Note  Patient Details  Name: Connor Small MRN: [id number] Date of Birth: 10/27/1960  Today's Date: 05/29/2012 Time: 1030-1130 Time Calculation (min): 60 min  Short Term Goals: Week 1:  OT Short Term Goal 1 (Week 1): Self Feeding:  Patient will feed himself at least 50% of 2 meals per day during this recording period. OT Short Term Goal 1 - Progress (Week 1): Progressing toward goal OT Short Term Goal 2 (Week 1): Grooming:  Patient will perform 2 grooming tasks with min assist to include set up once items needed are provided OT Short Term Goal 2 - Progress (Week 1): Progressing toward goal OT Short Term Goal 3 (Week 1): UB Dressing:  Patient will don shirt in supported sit with max assist OT Short Term Goal 3 - Progress (Week 1): Met OT Short Term Goal 4 (Week 1): Sitting balance:  Patient will sit without back support with mod assist while completing simple BADL task at least 2 days this recording period. OT Short Term Goal 4 - Progress (Week 1): Met OT Short Term Goal 5 (Week 1): UE exercises:  Patient will tolerate BUE exercises at least 4 times during this recording period. OT Short Term Goal 5 - Progress (Week 1): Met  Week 2:  OT Short Term Goal 1 (Week 2): Patient will perform grooming tasks with minimal assistance OT Short Term Goal 1 - Progress (Week 2): Met OT Short Term Goal 2 (Week 2): Patient will perform LB dressing with moderate assistance (pants only) OT Short Term Goal 2 - Progress (Week 2): Met OT Short Term Goal 3 (Week 2): Patient will be able to direct care with slide board transfer with min verbal cues OT Short Term Goal 3 - Progress (Week 2): Met OT Short Term Goal 4 (Week 2): Patient will be able to direct UE exercises with min verbal cues OT Short Term Goal 4 - Progress (Week 2): Met  Week 3:  OT Short Term Goal 1 (Week 3): Short Term Goals = Long Term Goals  Skilled Therapeutic Interventions/Progress Updates:  Patient found supine  in bed. Patient worked with previous therapist on RUE exercises. Patient engaged in bed mobility with minimal assistance and HOB raised in order to sit edge of bed. Once seated edge of bed, patient donned shirt with supervision and was able to thread bilateral feet into pants, standing with SARA PLUS in order for therapist to pull pants up to waist. Patient's wife then transferred patient from edge of bed -> w/c using slide board. Patient and wife talked through transfer and only required min verbal cues from therapist for safety during transfer. Wife assisted patient with positioning once in w/c. Therapist administered right dorsal wrist support splint for functional tasks such as self feeding and grooming. Recommend patient only use wrist support for these tasks at this time. Patient left seated in w/c with family present.   Precautions:  Precautions Precautions: Fall;Shoulder Type of Shoulder Precautions: right shoulder subluxation  Precaution Comments:  ostomy, tachycardia, Afib, peg tube (doesn't wear abd binder anymore) Required Braces or Orthoses: Other Brace/Splint Other Brace/Splint: right wrist splint Restrictions Weight Bearing Restrictions: No  See FIM for current functional status  Therapy/Group: Individual Therapy  Jannice Beitzel 05/29/2012, 12:11 PM

## 2012-05-29 NOTE — Progress Notes (Signed)
Physical Therapy Note  Patient Details  Name: Connor Small MRN: [id number] Date of Birth: Aug 27, 1960 Today's Date: 05/29/2012  Pt missed 45 minutes of skilled PT due to being taken off the unit for procedure. Will follow up as able.   [name] [facility] 05/29/2012, 2:36 PM

## 2012-05-30 ENCOUNTER — Inpatient Hospital Stay (HOSPITAL_COMMUNITY): Payer: BC Managed Care – PPO

## 2012-05-30 ENCOUNTER — Inpatient Hospital Stay (HOSPITAL_COMMUNITY): Payer: BC Managed Care – PPO | Admitting: Occupational Therapy

## 2012-05-30 ENCOUNTER — Ambulatory Visit (HOSPITAL_COMMUNITY): Payer: BC Managed Care – PPO | Admitting: *Deleted

## 2012-05-30 ENCOUNTER — Inpatient Hospital Stay (HOSPITAL_COMMUNITY): Payer: BC Managed Care – PPO | Admitting: Speech Pathology

## 2012-05-30 LAB — URINE MICROSCOPIC-ADD ON

## 2012-05-30 LAB — URINALYSIS, ROUTINE W REFLEX MICROSCOPIC
Bilirubin Urine: NEGATIVE
Glucose, UA: NEGATIVE mg/dL
Ketones, ur: NEGATIVE mg/dL
Leukocytes, UA: NEGATIVE
pH: 5 (ref 5.0–8.0)

## 2012-05-30 LAB — CBC
MCHC: 31.7 g/dL (ref 30.0–36.0)
Platelets: 524 10*3/uL — ABNORMAL HIGH (ref 150–400)
RDW: 18 % — ABNORMAL HIGH (ref 11.5–15.5)
WBC: 15.2 10*3/uL — ABNORMAL HIGH (ref 4.0–10.5)

## 2012-05-30 LAB — IRON AND TIBC
Iron: 12 ug/dL — ABNORMAL LOW (ref 42–135)
TIBC: 188 ug/dL — ABNORMAL LOW (ref 215–435)

## 2012-05-30 MED ORDER — PRO-STAT SUGAR FREE PO LIQD
30.0000 mL | Freq: Three times a day (TID) | ORAL | Status: DC
  Administered 2012-05-31 – 2012-06-06 (×19): 30 mL
  Filled 2012-05-30 (×23): qty 30

## 2012-05-30 MED ORDER — METOCLOPRAMIDE HCL 5 MG/5ML PO SOLN
10.0000 mg | Freq: Two times a day (BID) | ORAL | Status: DC
  Filled 2012-05-30 (×3): qty 10

## 2012-05-30 MED ORDER — FREE WATER
100.0000 mL | Freq: Three times a day (TID) | Status: DC
  Administered 2012-05-30 – 2012-06-01 (×8): 100 mL

## 2012-05-30 MED ORDER — FREE WATER: 100.0000 mL | Status: DC

## 2012-05-30 NOTE — Progress Notes (Signed)
Patient ID: Connor Small, male   DOB: 01-Jun-1960, 52 y.o.   MRN: [id number] Patient stated yesterday he was asymptomatic from an abdominal standpoint.  No pain or complaints.  He was eating well.   He underwent an aspiration of right sided abdominal fluid collection yesterday.  Cultures for this are pending.  WBC remain at 15K.  He has not had a UA check in 15 days.  Will check this to make sure this isn't the source of his WBC or low grade fevers as well.  Chest x-ray reveals enlarging pleural effusions. Will follow cultures.  Mitch Arquette E 8:37 AM 05/30/2012

## 2012-05-30 NOTE — Progress Notes (Signed)
Physical Therapy Session Note  Patient Details  Name: Connor Small MRN: [id number] Date of Birth: 26-Dec-1960  Today's Date: 05/30/2012 Time: 1415-1500 Time Calculation (min): 45 min  Short Term Goals: Week 2:  PT Short Term Goal 1 (Week 2): Pt will be able to perform bed mobility with mod A for supine to sit PT Short Term Goal 1 - Progress (Week 2): Progressing toward goal (mod to max A) PT Short Term Goal 2 (Week 2): Pt will be able to scoot on edge of mat (using LE's to push through) with mod A to aid with slide board transfers PT Short Term Goal 2 - Progress (Week 2): Met PT Short Term Goal 3 (Week 2): Pt will be able to propel w/c with hemi technique x 50' with mod A PT Short Term Goal 3 - Progress (Week 2): Progressing toward goal (Less than 67' with mod A)  Skilled Therapeutic Interventions/Progress Updates:   Co-treatment with focus on family education for simulated car transfer using slideboard from lower SUV height. Discussed obtaining measurements of wife and daughter's vehicle and practicing with real car at the end of the week. Therapist demonstrated transfer into the car with slideboard (uphill with max A) and then downhill with min/mod A; pt assisting with managing LE's but requires A with RLE. Recommended to do car transfers with 2 persons present initially for safety and family in agreement. Wife able to return demonstrate transfer in and out of car with min cues needed.   Therapy Documentation Precautions:  Precautions Precautions: Fall Type of Shoulder Precautions: right shoulder subluxation  Precaution Comments:  ostomy, tachycardia, Afib, peg tube (doesn't wear abd binder anymore) Required Braces or Orthoses: Other Brace/Splint Other Brace/Splint: right wrist splint Restrictions Weight Bearing Restrictions: No   Pain: Pain Assessment Pain Assessment: 0-10 Pain Score: 0-No pain  See FIM for current functional status  Therapy/Group: Individual Therapy and  Co-Treatment with Recreational Therapy  [name] [facility] 05/30/2012, 3:58 PM

## 2012-05-30 NOTE — Progress Notes (Signed)
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 Aspiration pneumonitis 04/26/2012  . Clostridium difficile colitis 04/26/2012  . Intra-abdominal fluid collection/LUQ 04/26/2012  . CKD (chronic kidney disease) stage 2, GFR 60-89 ml/min 04/26/2012  . Acute renal failure 04/26/2012  . Hypokalemia 04/26/2012  . Protein-calorie malnutrition, severe 04/25/2012  . Spleen hematoma - subcapsular & without rupture  04/25/2012  . Ascites s/p US guide paracentesis (4/2) 04/25/2012  . Ileostomy in place 04/24/2012  . HAP (hospital-acquired pneumonia) 04/21/2012  . 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 Acute blood loss anemia 04/18/2012  . S/p total colectomy for toxic megacolon, C diff 03/23/2012  . Hypernatremia, improving 03/23/2012  . Severe muscle deconditioning 03/23/2012  . Shoulder subluxation, right 03/23/2012  . 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 Coagulopathy 03/23/2012  . Quadriparesis 03/17/2012  . Leukocytosis, unspecified 03/15/2012  . Hyperbilirubinemia, due to cholestasis 03/15/2012  . PAF (paroxysmal atrial fibrillation) 03/15/2012  . Anemia of chronic disease 03/15/2012  . Pleural effusion, L > R 03/04/2012  . Sinus tachycardia 02/28/2012  . Hematuria 02/28/2012  . history of UC (ulcerative colitis) 12/06/2010    Expected Discharge Date: Expected Discharge Date:  06/06/12  Team Members Present:  MD: Dr Faith Rogue  RN: Daryll Brod, Bayard Hugger  CSW: Amada Jupiter  Jiles Crocker, Sherrine Maples  OT: Edwin Cap, Ardis Rowan, Mackie Pai  SLP: Feliberto Gottron  Tora Duck, RN, PPS Coordinator  Lutricia Horsfall, [facility]       Current Status/Progress Goal Weekly Team Focus  Medical   persistent temp and leukocytosis, fluid collections found in abdomen, CCS, ID, and Critical care are all following.   optimize medical status mgt to allow increased activity tolerance  see prior, multi specialty mgt of medical problems   Bowel/Bladder   cont of bladder/ileostomy  remain cont of bladder  remain continent of bladder   Swallow/Nutrition/ Hydration             ADL's   supervision UB dressing, max assist LB dressing, max assist for slide board transfers, maxX2 sit<>stands  overall mod assist; supervision for grooming tasks, self-feeding tasks, and dynamic sitting balance  standing balance/tolerance/endurance, dunctional use of BUEs, ROM/strengthening, self-feeding, grooming, UB/LB bating & dressing, family education   Mobility   fluctuates with transfers from mod to max A; improvement with scooting but encouragement needed; max A +2 for standing with Fara Boros; min to S for w/c propulsion with LE's and LUE  mod A basic transfers w/c level; S w/c propulsion in controlled environment and min A in home environment  activit tolerance, standing balance, pre-gait, NMR, transfers, family education, functional strengthening   Communication             Safety/Cognition/ Behavioral Observations  supervision-Mod I  Mod I  D/C this week   Pain   no c/o pain or discomfort  pain less than or equal to 3/10  assess and medicate as needed.   Skin   ABD wound with sutures dressing change daily  no further skin breakdown/infection  will remain free of infection/breakdown      *See Care Plan and progress notes for long and short-term goals.  Barriers to  Discharge: see above    Possible Resolutions to Barriers:  see above. working on family ed and preparation to ultimately return home.     Discharge Planning/Teaching Needs:  Home with wife and daughter to provide 24/7 assistance      Team Discussion:  Discussion of pt's medical issues. Pt with low grade fever. Await C&S results of tap from last week. Mobility fluctuates. SLP to discharge pt.   Revisions to Treatment Plan:  SLP to discharge pt.   Continued Need for Acute Rehabilitation Level of Care: The patient requires daily medical management by a physician with specialized training in physical medicine and rehabilitation for the following conditions: Daily direction of a multidisciplinary physical rehabilitation program to ensure safe treatment while eliciting the highest outcome that is of practical value to the patient.: Yes Daily medical management of patient stability for increased activity during participation in an intensive rehabilitation regime.: Yes Daily analysis of laboratory values and/or radiology reports with any subsequent need for medication adjustment of medical intervention for : Pulmonary problems;Post surgical problems;Neurological problems  [name] Dare 05/30/2012, 12:11 PM

## 2012-05-30 NOTE — Progress Notes (Signed)
Occupational Therapy Session Note  Patient Details  Name: Connor Small MRN: [id number] Date of Birth: 1960-02-19  Today's Date: 05/30/2012 Time: 1062-6948 Time Calculation (min): 56 min  Short Term Goals: Week 3:  OT Short Term Goal 1 (Week 3): Short Term Goals = Long Term Goals  Skilled Therapeutic Interventions/Progress Updates:    Pt performed UE exercises in bed prior to sitting up for dressing tasks.  Performed left shoulder flexion with mod assist and orange theraband for 1 set of 10 repetitions.  He was able to perform 10 repetitions of elbow flexion and extension with orange theraband on his own, with therapist stabilizing the band.  Performed AAROM shoulder flexion on the right arm with min facilitation as well as elbow AROM flexion and extension.  Pt still with decreased ability to perform wrist extension or MP extension actively.  Transitioned to sitting with max assist to work on UB dressing.  Pt utilized reacher for donning shoes along with shoe horn and min assist.  Used [name] Plus for standing in order for therapist to pull up his pants.  Also worked on [employer] but having pt maintain upright standing.  Pt able to maintain for 45 seconds to 1 minute.  Pt performed squat pivot transfer to wheelchair with max assist.  Also performed one stand from wheelchair with max assist and maintained standing for 30 seconds.  Therapy Documentation Precautions:  Precautions Precautions: Fall Type of Shoulder Precautions: right shoulder subluxation  Precaution Comments:  ostomy, tachycardia, Afib, peg tube (doesn't wear abd binder anymore) Required Braces or Orthoses: Other Brace/Splint Other Brace/Splint: right wrist splint Restrictions Weight Bearing Restrictions: No  Pain: Pain Assessment Pain Assessment: 0-10 Pain Score:   6 Pain Type: Acute pain Pain Location: Back Pain Descriptors: Aching Pain Frequency: Occasional Pain Intervention(s): Medication (See eMAR) Multiple Pain  Sites: No ADL: See FIM for current functional status  Therapy/Group: Individual Therapy  Andreyah Natividad OTR/L 05/30/2012, 12:22 PM

## 2012-05-30 NOTE — Progress Notes (Signed)
INFECTIOUS DISEASE PROGRESS NOTE  ID: Connor Small is a 52 y.o. 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   Abtx:  Anti-infectives   Start     Dose/Rate Route Frequency Ordered Stop   05/26/12 0800  fluconazole (DIFLUCAN) 40 MG/ML suspension 100 mg  Status:  Discontinued     100 mg Oral Daily 05/25/12 0655 05/25/12 0656   05/25/12 0700  fluconazole (DIFLUCAN) 40 MG/ML suspension 200 mg  Status:  Discontinued     200 mg Oral  Once 05/25/12 0655 05/25/12 0656   05/16/12 1800  levofloxacin (LEVAQUIN) tablet 750 mg  Status:  Discontinued     750 mg Oral Daily-1800 05/16/12 1551 05/27/12 0655   05/15/12 0830  ciprofloxacin (CIPRO) tablet 500 mg  Status:  Discontinued    Comments:  Begin after urine sample is collected   500 mg Oral 2 times daily 05/15/12 0740 05/16/12 1551      Medications:  Scheduled: . aspirin  81 mg Per Tube Daily  . clonazePAM  0.5 mg Oral QHS  . darbepoetin (ARANESP) injection - NON-DIALYSIS  40 mcg Subcutaneous Q Tue-1800  . [START ON 05/31/2012] feeding supplement  30 mL Per Tube TID WC  . feeding supplement (VITAL 1.5 CAL)  237 mL Per Tube Custom  . ferrous sulfate  300 mg Per Tube BID WC  . free water  100 mL Per Tube TID WC & HS  . lidocaine  1 patch Transdermal Q24H  . lip balm   Topical BID  . loperamide  2 mg Per Tube QHS  . loperamide  2-4 mg Per Tube UD  . metoCLOPramide  10 mg Oral TID AC  . metoprolol tartrate  6.25 mg Per Tube Daily  . multivitamin  5 mL Per Tube Daily  . pantoprazole sodium  40 mg Per Tube Q1200  . saccharomyces boulardii  250 mg Oral BID    Objective: Vital signs in last 24 hours: Temp:  [99.7 F (37.6 C)-99.8 F (37.7  C)] 99.7 F (37.6 C) (05/07 0518) Pulse Rate:  [115-118] 118 (05/07 0846) Resp:  [18] 18 (05/07 0518) BP: (90-93)/(50-51) 90/50 mmHg (05/07 0846) SpO2:  [98 %] 98 % (05/07 0518) Weight:  [59.693 kg (131 lb 9.6 oz)] 59.693 kg (131 lb 9.6 oz) (05/07 0518)   General appearance: pt not in room.  Lab Results  Recent Labs  05/28/12 0538 05/29/12 0610 05/30/12 0545  WBC 15.0* 14.6* 15.2*  HGB 7.8* 7.9* 8.3*  HCT 25.1* 24.3* 26.2*  NA 137 134*  --   K 4.5 4.1  --   CL 101 98  --   CO2 26 24  --   BUN 20 20  --   CREATININE 0.97 1.00  --    Liver Panel  Recent Labs  05/28/12 0538 05/29/12 0610  PROT 6.0 6.0  ALBUMIN 1.9* 1.8*  AST 17 18  ALT 13 14  ALKPHOS 125* 125*  BILITOT 0.4 0.4   Sedimentation Rate No results found for this basename: ESRSEDRATE,  in the last 72 hours C-Reactive Protein No results found for this basename: CRP,  in the last 72 hours  Microbiology:  Recent Results (from the past 240 hour(s))  BODY FLUID CULTURE     Status: None   Collection Time    05/23/12 10:37 AM      Result Value Range Status   Specimen Description FLUID LEFT PLEURAL   Final   Special Requests Normal   Final   Gram Stain     Final   Value: RARE WBC PRESENT, PREDOMINANTLY PMN     NO ORGANISMS SEEN   Culture NO GROWTH 3 DAYS   Final   Report Status 05/26/2012 FINAL   Final  FUNGAL STAIN     Status: None   Collection Time    05/23/12 10:37 AM      Result Value Range Status   Specimen Description FLUID LEFT PLEURAL   Final   Special Requests Normal   Final   Fungal Smear NO YEAST OR FUNGAL ELEMENTS SEEN   Final   Report Status 05/24/2012 FINAL   Final  BODY FLUID CULTURE     Status: None   Collection Time    05/29/12  3:21 PM      Result Value Range Status   Specimen Description PERITONEAL FLUID   Final   Special Requests FROM PERITONEAL CAVITY   Final   Gram Stain PENDING   Incomplete   Culture PENDING   Incomplete   Report Status PENDING   Incomplete     Studies/Results: US Paracentesis  05/29/2012  *RADIOLOGY REPORT*  Clinical Data: Patient with 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  Request is made for diagnostic paracentesis.  ULTRASOUND GUIDED DIAGNOSTIC  PARACENTESIS  An ultrasound guided paracentesis was thoroughly discussed with the patient/patient's wife and questions answered.  The benefits, risks, alternatives and complications were also discussed.  The patient/patient's wife understands and wishes to proceed with the procedure.  Written consent was obtained.  Ultrasound was performed to localize and mark an adequate pocket of fluid in the right mid to lower quadrant of the abdomen.  The area was then prepped and draped in the normal sterile fashion.  1% Lidocaine was used for local anesthesia.  Under ultrasound guidance a 19 gauge Yueh catheter was introduced.  Paracentesis was performed.  The catheter was removed and a dressing applied.  Complications:  none  Findings:  A total of approximately 6 cc's of clear, yellow fluid was removed.  A fluid sample was sent for culture/sensitivity.  IMPRESSION: Successful ultrasound guided diagnostic  paracentesis yielding 6 cc's of ascites.  Read by: Rowe Robert, P.A.-C   Original Report Authenticated By: Marybelle Killings, M.D.    Dg Chest Port 1 View  05/30/2012  *RADIOLOGY REPORT*  Clinical Data: Bilateral pleural effusions.  PORTABLE CHEST - 1 VIEW  Comparison: 05/25/2012 and CT chest dated 05/28/2012  Findings: The left pleural effusion has significantly increased. Small right effusion is now visible on chest x-ray.  Heart size and pulmonary vascularity are normal.  IMPRESSION:  1.  Increasing large left pleural effusion. 2.  Increasing small right pleural effusion.   Original Report Authenticated By: Lorriane Shire, M.D.      Assessment/Plan: Low grade temps,  leukocytosis  No fever in 24 h. WBC stable.  His gram stain showed no ogranisms. Few wbc (mono). Await Cx.  Will continue to hold anbx for now.   Total days of antibiotics: off  levaquin 4-23 ---->5-3  vanco (po) 3-29 ----> 4-5  Aztreonam  3-29 ----> 4-2           Bobby Rumpf Infectious Diseases [phone] 05/30/2012, 3:54 PM   LOS: 21 days

## 2012-05-30 NOTE — Progress Notes (Signed)
PULMONARY  / CRITICAL CARE MEDICINE  Name: Connor Small MRN: [id number] DOB: 05/04/1960    ADMISSION DATE:  05/09/2012 CONSULTATION DATE:  4/30  REFERRING MD :  Riley Kill PRIMARY SERVICE:  rehab  CHIEF COMPLAINT:  Left effusion   BRIEF PATIENT DESCRIPTION:  17 YOM admitted to [facility] for Cdiff on 2/2. Underwent decompressive cecostomy 2/3, Hospital course complicated by ARDS, PNA, septic shock, bilateral cardioembolic CVAs, renal failure, wound dehiscence and required multiple OR visits. To  rehab, aspiration event 3/29-->treated, back to rehab. PCCM asked once again to see on 4/30 for on going low gd temp,  left effusion (which has been chronic since feb 2014, but has gotten a little worse).    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 Overlying bowel gas and patientclinical status. No explanation for elevated liver function tests.Similar to slight increase in size of a perisplenic fluidcollection.4. Right pleural effusion  3/28 - Called back to see on rehab for ? Asp  4/30 - L thora with 1750 orange tinged fluid removed, fluid c/w exudative process, no malignant cells, neg culture  LINES / TUBES:  CULTURES: UC 4/22 >> insig growth  Pleural Fluid 4/30 >>>no yeast / fungal, neg culture  ANTIBIOTICS: levaquin 4/22>>>5/4    SUBJECTIVE: No acute changes.    VITAL SIGNS: Temp:  [98.2 F (36.8 C)-99.8 F (37.7 C)] 99.7 F (37.6 C) (05/07 0518) Pulse Rate:  [115-126] 118 (05/07 0846) Resp:  [16-18] 18 (05/07 0518) BP: (90-104)/(50-71) 90/50  mmHg (05/07 0846) SpO2:  [98 %] 98 % (05/07 0518) Weight:  [131 lb 9.6 oz (59.693 kg)] 131 lb 9.6 oz (59.693 kg) (05/07 0518) Room air   PHYSICAL EXAMINATION: General:  Awake, oriented, chronically ill appearing but much stronger / improving Neuro:  Oriented. Generalized weakness w/ RUE weakness. Some short term mem def  HEENT:  [place], no JVD Cardiovascular:  reg Lungs:  Decreased left  Abdomen:  Mid abd dressing intact, ostomy draining. PEG unremark Musculoskeletal:  Generalized weakness but improved, no sig swelling    Recent Labs Lab 05/25/12 0610 05/28/12 0538 05/29/12 0610  NA 136 137 134*  K 4.0 4.5 4.1  CL 98 101 98  CO2 26 26 24   BUN 18 20 20   CREATININE 0.79 0.97 1.00  GLUCOSE 118* 99 91    Recent Labs Lab 05/28/12 0538 05/29/12 0610 05/30/12 0545  HGB 7.8* 7.9* 8.3*  HCT 25.1* 24.3* 26.2*  WBC 15.0* 14.6* 15.2*  PLT 522* 527* 524*   Ct Chest W Contrast  05/28/2012  **ADDENDUM** CREATED: 05/28/2012 14:34:16  There is a typographical error in the first paragraph of the abdomen and pelvis portion of the report.  "This measures 4.7 x 810.3 by 9.5 cm."  This should read:   "This measures 4.7 x 8.3 x 9.5 cm."  **END ADDENDUM** SIGNED BY: Rosealee Albee, M.D.   05/28/2012  *RADIOLOGY REPORT*  Clinical Data:  Fever and leukocytosis  CT CHEST, ABDOMEN AND PELVIS WITH CONTRAST  Technique:  Multidetector CT imaging of the  chest, abdomen and pelvis was performed following the standard protocol during bolus administration of intravenous contrast.  Contrast: OMNIPAQUE IOHEXOL 300 MG/ML  SOLN  Comparison:  CT abdomen and pelvis 04/24/2012.  CT CHEST  Findings:  There is a large left pleural effusion.  Moderate right pleural effusion is present.  Compressive type consolidation and atelectasis is noted within the left midlung.  The heart size is normal.  There is no pericardial effusion.  No enlarged lymph node within the mediastinum or hilar region.  Review of the visualized bony  structures is unremarkable.  No worrisome lytic or sclerotic bone lesions.  IMPRESSION:  1.  Bilateral pleural effusions, left greater than right.  CT ABDOMEN AND PELVIS  Findings:  There is no focal liver abnormality.  Gallbladder appears normal.  No biliary dilatation.  The pancreas is unremarkable.  There is a large fluid collection overlying the spleen.  This measures 4.7 x 810.3 by 9.5 cm.  A second fluid collection encases the small bowel loops and extends into the right upper quadrant along the anterior margin of the right hepatic lobe.  The adrenal glands are both normal.  Normal appearance of the right kidney.  Multiple hypodensities are noted within the left kidney which may represent cysts.  Urinary bladder appears normal.  The prostate gland and seminal vesicles are unremarkable.  There is no adenopathy within the upper abdomen.  There is no pelvic or inguinal adenopathy.  The patient has a gastrostomy tube. The small bowel loops have a normal caliber.  The right lower quadrant ileostomy noted.  The patient is status post subtotal colectomy.  Review of the visualized bony structures is unremarkable.  IMPRESSION:  1.  There are two fluid collections within the abdomen. This likely accounts for the patient's persistent fever and leukocytosis.  The first is in the left upper quadrant and extends around the spleen. The second encases the small bowel loops and extends along the anterior margin of the liver.  These should be amendable to percutaneous drainage under image guidance. 2.  Gastrostomy. 3.  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  Original Report Authenticated By: Signa Kell, M.D.    Ct Abdomen Pelvis W Contrast  05/28/2012  **ADDENDUM** CREATED: 05/28/2012 14:34:16  There is a typographical error in the first paragraph of the abdomen and pelvis portion of the report.  "This measures 4.7 x 810.3 by 9.5 cm."  This should read:   "This measures 4.7 x 8.3 x 9.5 cm."  **END ADDENDUM**  SIGNED BY: Rosealee Albee, M.D.   05/28/2012  *RADIOLOGY REPORT*  Clinical Data:  Fever and leukocytosis  CT CHEST, ABDOMEN AND PELVIS WITH CONTRAST  Technique:  Multidetector CT imaging of the chest, abdomen and pelvis was performed following the standard protocol during bolus administration of intravenous contrast.  Contrast: OMNIPAQUE IOHEXOL 300 MG/ML  SOLN  Comparison:  CT abdomen and pelvis 04/24/2012.  CT CHEST  Findings:  There is a large left pleural effusion.  Moderate right pleural effusion is present.  Compressive type consolidation and atelectasis is noted within the left midlung.  The heart size is normal.  There is no pericardial effusion.  No enlarged lymph node within the mediastinum or hilar region.  Review of the visualized bony structures is unremarkable.  No worrisome lytic or sclerotic bone lesions.  IMPRESSION:  1.  Bilateral pleural effusions, left greater than right.  CT ABDOMEN AND PELVIS  Findings:  There is no focal liver abnormality.  Gallbladder  appears normal.  No biliary dilatation.  The pancreas is unremarkable.  There is a large fluid collection overlying the spleen.  This measures 4.7 x 810.3 by 9.5 cm.  A second fluid collection encases the small bowel loops and extends into the right upper quadrant along the anterior margin of the right hepatic lobe.  The adrenal glands are both normal.  Normal appearance of the right kidney.  Multiple hypodensities are noted within the left kidney which may represent cysts.  Urinary bladder appears normal.  The prostate gland and seminal vesicles are unremarkable.  There is no adenopathy within the upper abdomen.  There is no pelvic or inguinal adenopathy.  The patient has a gastrostomy tube. The small bowel loops have a normal caliber.  The right lower quadrant ileostomy noted.  The patient is status post subtotal colectomy.  Review of the visualized bony structures is unremarkable.  IMPRESSION:  1.  There are two fluid collections  within the abdomen. This likely accounts for the patient's persistent fever and leukocytosis.  The first is in the left upper quadrant and extends around the spleen. The second encases the small bowel loops and extends along the anterior margin of the liver.  These should be amendable to percutaneous drainage under image guidance. 2.  Gastrostomy. 3.  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  Original Report Authenticated By: Signa Kell, M.D.    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  Request is made for diagnostic paracentesis.  ULTRASOUND GUIDED DIAGNOSTIC  PARACENTESIS  An ultrasound guided paracentesis was thoroughly discussed with the patient/patient's wife and questions answered.  The benefits, risks, alternatives and complications were also discussed.  The patient/patient's wife understands and wishes to proceed with the procedure.  Written consent was obtained.  Ultrasound was performed to localize and mark an adequate pocket of fluid in the right mid to lower quadrant of the abdomen.  The area was then prepped and draped in the normal sterile fashion.  1% Lidocaine was used for local anesthesia.  Under ultrasound guidance a 19 gauge Yueh catheter was introduced.  Paracentesis was performed.  The catheter was removed and a dressing applied.  Complications:  none  Findings:  A total of approximately 6 cc's of clear, yellow fluid was removed.  A fluid sample was sent for culture/sensitivity.  IMPRESSION: Successful ultrasound guided diagnostic  paracentesis yielding 6 cc's of ascites.  Read by: Jeananne Rama, P.A.-C   Original Report Authenticated By: Jolaine Click, M.D.    Dg Chest Port 1 View  05/30/2012  *RADIOLOGY REPORT*  Clinical Data: Bilateral pleural effusions.  PORTABLE  CHEST - 1 VIEW  Comparison: 05/25/2012 and CT chest dated 05/28/2012  Findings: The left pleural effusion has significantly increased. Small right effusion is now visible on chest x-ray.  Heart size and pulmonary vascularity are normal.  IMPRESSION:  1.  Increasing large left pleural effusion. 2.  Increasing small right pleural effusion.   Original Report Authenticated By: Francene Boyers, M.D.    ASSESSMENT / PLAN: Left pleural effusion. Chronic since feb post PNA.  Repeat PNA in April with associated effusion s/p thora 4/30. Was exudate in past and again on 4/30.  Negative culture, neg fungal, & cytology.     Plan: -completed abx 5/4 (levaquin) -CT Chest, ABD / Pelvis with fluid collections x2  in abd, most likely this accounts for fevers  -hold off on thora for now, wait to see if sympathetic and resolves but enlarging on CXR 5/7 -recommend outpatient pulmonary follow up post discharge.  -aggressive pulmonary hygiene -nutritional support -2 view on 5/12   Fever Unclear etiology.  Suspect abd fluid pockets are source but large L effusion with compressive atx may be contributing as well.   Plan: -follow culture results   PCCM will see 5/12 with repeat CXR @ that time  [name], NP-C [facility] Pgr: [phone] or [phone]    Billy Fischer, MD ; [facility] service Mobile [phone].  After 5:30 PM or weekends, call [phone]

## 2012-05-30 NOTE — Progress Notes (Signed)
Recreational Therapy Session Note  Patient Details  Name: Connor Small MRN: [id number] Date of Birth: 1960/05/10 Today's Date: 05/30/2012 No c/o pain  Pt participated in animal assisted activity/therapy seated in w/c, pt's family present & participatory.   [place] 05/30/2012, 5:26 PM

## 2012-05-30 NOTE — Progress Notes (Signed)
Imogene Burn. Georgette Dover, MD, [facility]  General/ Trauma Surgery  05/30/2012 8:40 AM

## 2012-05-30 NOTE — Progress Notes (Signed)
Recreational Therapy Session Note  Patient Details  Name: SANTEZ WOODCOX MRN: [id number] Date of Birth: 1960/03/11 Today's Date: 05/30/2012 Time:  215-3 Pain: no c/o Skilled Therapeutic Interventions/Progress Updates: Session focused on family education for simulated car transfer to SUV seat height using slide board with pt's wife & daughter in preparation for discharge home and for future community pursuits.  Pt required max assist up hill and min-mod downhill.  Recommended 2 people to be present for safety during car transfer, family stated understanding/agreement.  Wife then returned demonstration of slide board transfer in & out of car given min cues.  Pt & family also requesting/quetioning about potential outing for pt to [place] [employer] to prepare for community pursuits post discharge.  Will address with treatment team and schedule as appropriate.  Also discussed plan for kitchen activity/cooking to be scheduled tomorrow, all in agreement.   Therapy/Group: Co-Treatment  Hanif Radin 05/30/2012, 5:03 PM

## 2012-05-30 NOTE — Progress Notes (Signed)
Speech Language Pathology Discharge Summary  Patient Details  Name: Connor Small MRN: [id number] Date of Birth: 19-Apr-1960  Today's Date: 05/30/2012 Time: 1173-5670 Time Calculation (min): 40 min  Skilled Therapeutic Intervention: Treatment focus on pt/family education. Pt and his family educated on current cognitive function and strategies to utilize at home to maximize working memory and overall awareness. Pt will discharge from skilled SLP intervention due to all goals met. Pt does not need f/u home health SLP intervention at this time, however, pt educated on availability of outpatient skilled SLP intervention if needed at a later time.   Patient has met 4 of 4 long term goals.  Patient to discharge at overall Modified Independent;Supervision level.   Reasons goals not met: N/A   Clinical Impression/Discharge Summary: Pt has made great progress and has met 4 of 4 LTG's this admission due to improvements in the areas of working memory with utilization of compensatory strategies, functional problem solving, attention and awareness. Pt is also 100% intelligible and can express his wants/needs with Mod I. Pt is also tolerating a regular diet with thin liquids without overt s/s of aspiration and is consuming between 25-50% of meals. Pt/family education complete. Pt will discharge from skilled SLP intervention due to all treatment goals met. Recommend no f/u at this time but educated pt on availability of outpatient skilled SLP intervention if pt feels necessary. Pt and family verbalized understanding and agreement.    Care Partner:  Caregiver Able to Provide Assistance: Yes  Type of Caregiver Assistance: Physical;Cognitive  Recommendation:  Outpatient SLP;24 hour supervision/assistance (if necessary)  Rationale for SLP Follow Up: Maximize cognitive function and independence   Equipment: N/A   Reasons for discharge: Treatment goals met   Patient/Family Agrees with Progress Made and Goals  Achieved: Yes   See FIM for current functional status  Cimberly Stoffel 05/30/2012, 3:19 PM

## 2012-05-30 NOTE — Progress Notes (Signed)
NUTRITION FOLLOW UP  DOCUMENTATION CODES  Per approved criteria   -Severe malnutrition in the context of acute illness or injury    Intervention:    Continue the following enteral nutrition regimen:  Continue 237 ml bolus feedings of Vital 1.5 QID. Pt to receive bolus only if oral intake is <50% of meals. Pt should always receive HS bolus. Total bolus regimen (4 x 237 ml daily) will provide: 1420 kcal, 64 grams protein, 724 ml free water.  Add 30 ml Prostat via tube TID with meals. This will provide an additional 300 kcal and 45 grams protein. RD to continue to follow nutrition care plan. Once pt is tolerating this, will add fourth 30 ml Prostat via tube with HS bolus.   Decrease free water to 100 ml q 4 hours while awake - this will provide an additional 600 ml free water daily  Nutrition Dx:   Inadequate oral intake related to poor appetite, early satiety, and lethargy as evidenced by poor meal completion. Improving.  Goal:   Intake to meet >90% of estimated nutrition needs. Met with enteral nutrition and oral intake.  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 Pt with acute respiratory and renal failure with ARDS and septic shock. Pt recently discharged to CIR, however currently admitted as inpatient due to possible aspiration PNA (4/2).   Hospital course nutrition summary:  2/2 - 2/20: permissively underfed with EN  2/21-2/26: advanced to full feeds  2/26 - 3/3: feeds held for surgery  3/4 - 3/11: resumed full feeds  3/11 - 3/12: feeds held for GI bleed  3/13 - 3/24: received full feeds  3/24: TFs discontinued diet initiated; pt with very poor oral intake; tx to rehab 3/25  3/28 - 4/1: increased n/v - pt  refusing almost all meals and supplements; wife declined calorie count, requesting TPN  4/2: NGT placed to suction, surgery ordering TF initiation; tx back to acute with plans for trickle feedings.  4/3-4/4: Trickle feeds of Vital 1.2 @ 30 mL/hr  4/5: PEG placement, resume of Vital 1.2  4/6-4/9: Inconsistent TF infusion and frequent adjustment of rate. Pt has not yet reached >40 mL/hr with tolerance. Wt now 151 lbs.  4/10-11: Pt has advanced as scheduled to 60 mL/hr. Discussed RN. Plan to increase to 70 mL this afternoon.  4/14: Pt has reached goal TF rate of 75 mL/hr with tolerance. PO intake remains insufficient with improvement per pt's report.  4/15: Pt ready to begin transition to cyclic feeds. RD to order Vital 1.2 @ 85 mL/hr x21 hrs daily. Will continue with daily advancements as tolerated and shorter duration of tubefeeds.  4/16: Pt agreeable to change formula to Vital 1.5 @ 80 mL/hr x 18 hrs daily.  4/17: Transfer to CIR. Continues with order of Vital 1.5 @ 80 ml/hr x 18 hrs daily. 4/21: Initiation of 3-day calorie count; continue Vital 1.5 @ 80 ml/hr x 18 hrs daily. 4/22: Day 1 Calorie Count Results -824 kcal (37% of minimum estimated needs) and 13.5 protein (11% of minimum estimated needs) 4/25:  Pt continues cyclic regimen.  Calorie count complete.  Wife and patient informed of results. ecreased cyclic feeds overnight and start transition to bolus feeds. Changes in regimen based on calorie  count results are listed in interventions above. 4/25: Pt tolerated 80 mL bolus Vital 1.5 administered over 30 minutes at 6p.  Resume Vital 1.5 @ 80 mL/hr x 15 hrs from 7p-10a. 4/27 - 4/28: Pt with low grade fevers and n/v. Did not attempt boluses over weekend per wife. 4/29: Received 2 x 80 ml boluses at [employer]; Breakfast bolus skipped 2/2 x-ray. 4/30: Received 2 x 160 ml boluses at [employer]; Breakfast bolus skipped 2/2 thoracentesis. 5/1: Received 3 x 237 ml boluses at Breakfast,  Lunch, and [employer]. 5/3: Nocturnal feedings discontinued. 5/4: Pt eating well enough to the point where he did not need boluses. Nocturnal feedings discontinued. HS bolus added, per wife's request. 5/6: Tolerating boluses well, one 30 ml Prostat daily tolerated well. 5/7: Wife agreeable to providing boluses without pump - discussed with RN. Added 30 ml Prostat via tube TID with meals tomorrow.  Working towards d/c date of 5/14. Most recent temperature is 99.7 degrees F.   Pt receiving 237 mL bolus of Vital 1.5 administered over 30 minutes at 0800, 1300, 1800, 2130. Pt to receive bolus if meal intake <50%. Continues with free water flush of 100 ml q 3 hours - noted most recent sodium is low. Will change to 100 ml q 4 hours.  Height: Ht Readings from Last 1 Encounters:  05/09/12 5' 10"  (1.778 m)    Weight Status:   Wt Readings from Last 1 Encounters:  05/30/12 131 lb 9.6 oz (59.693 kg)  Wt variable  Re-estimated needs:  Kcal: 2200 - 2400 Protein: 120 - 140 grams Fluid: > 2.2 liters  Skin: incisions, ileostomy, stage II pressure ulcer to R buttocks  Diet Order: General   Intake/Output Summary (Last 24 hours) at 05/30/12 1106 Last data filed at 05/30/12 0800  Gross per 24 hour  Intake   1314 ml  Output   2400 ml  Net  -1086 ml    Last BM: 5/6 (1650 ml output today via ostomy yesterday)  Labs:   Recent Labs Lab 05/25/12 0610 05/28/12 0538 05/29/12 0610  NA 136 137 134*  K 4.0 4.5 4.1  CL 98 101 98  CO2 26 26 24   BUN 18 20 20   CREATININE 0.79 0.97 1.00  CALCIUM 8.7 9.2 9.1  GLUCOSE 118* 99 91    CBG (last 3)  No results found for this basename: GLUCAP,  in the last 72 hours  Scheduled Meds: . aspirin  81 mg Per Tube Daily  . clonazePAM  0.5 mg Oral QHS  . darbepoetin (ARANESP) injection - NON-DIALYSIS  40 mcg Subcutaneous Q Tue-1800  . [START ON 05/31/2012] feeding supplement  30 mL Per Tube TID WC  . feeding supplement (VITAL 1.5 CAL)  237 mL Per Tube Custom   . ferrous sulfate  300 mg Per Tube BID WC  . free water  100 mL Per Tube Q3H  . lidocaine  1 patch Transdermal Q24H  . lip balm   Topical BID  . loperamide  2 mg Per Tube QHS  . loperamide  2-4 mg Per Tube UD  . metoCLOPramide  10 mg Oral TID AC  . metoprolol tartrate  6.25 mg Per Tube Daily  . multivitamin  5 mL Per Tube Daily  . pantoprazole sodium  40 mg Per Tube Q1200  . saccharomyces boulardii  250 mg Oral BID    Continuous Infusions: none    Inda Coke MS, RD, LDN Pager: [phone] After-hours pager: [phone]

## 2012-05-30 NOTE — Progress Notes (Signed)
Subjective/Complaints: No issues overnight. Denies pain. Rested comfortably A 12 point review of systems has been performed and if not noted above is otherwise negative.   Objective: Vital Signs: Blood pressure 93/51, pulse 115, temperature 99.7 F (37.6 C), temperature source Axillary, resp. rate 18, height 5' 10"  (1.778 m), weight 59.693 kg (131 lb 9.6 oz), SpO2 98.00%. Ct Chest W Contrast  05/28/2012  **ADDENDUM** CREATED: 05/28/2012 14:34:16  There is a typographical error in the first paragraph of the abdomen and pelvis portion of the report.  "This measures 4.7 x 810.3 by 9.5 cm."  This should read:   "This measures 4.7 x 8.3 x 9.5 cm."  **END ADDENDUM** SIGNED BY: Angelita Ingles, M.D.   05/28/2012  *RADIOLOGY REPORT*  Clinical Data:  Fever and leukocytosis  CT CHEST, ABDOMEN AND PELVIS WITH CONTRAST  Technique:  Multidetector CT imaging of the chest, abdomen and pelvis was performed following the standard protocol during bolus administration of intravenous contrast.  Contrast: 118m OMNIPAQUE IOHEXOL 300 MG/ML  SOLN  Comparison:  CT abdomen and pelvis 04/24/2012.  CT CHEST  Findings:  There is a large left pleural effusion.  Moderate right pleural effusion is present.  Compressive type consolidation and atelectasis is noted within the left midlung.  The heart size is normal.  There is no pericardial effusion.  No enlarged lymph node within the mediastinum or hilar region.  Review of the visualized bony structures is unremarkable.  No worrisome lytic or sclerotic bone lesions.  IMPRESSION:  1.  Bilateral pleural effusions, left greater than right.  CT ABDOMEN AND PELVIS  Findings:  There is no focal liver abnormality.  Gallbladder appears normal.  No biliary dilatation.  The pancreas is unremarkable.  There is a large fluid collection overlying the spleen.  This measures 4.7 x 810.3 by 9.5 cm.  A second fluid collection encases the small bowel loops and extends into the right upper quadrant along the  anterior margin of the right hepatic lobe.  The adrenal glands are both normal.  Normal appearance of the right kidney.  Multiple hypodensities are noted within the left kidney which may represent cysts.  Urinary bladder appears normal.  The prostate gland and seminal vesicles are unremarkable.  There is no adenopathy within the upper abdomen.  There is no pelvic or inguinal adenopathy.  The patient has a gastrostomy tube. The small bowel loops have a normal caliber.  The right lower quadrant ileostomy noted.  The patient is status post subtotal colectomy.  Review of the visualized bony structures is unremarkable.  IMPRESSION:  1.  There are two fluid collections within the abdomen. This likely accounts for the patient's persistent fever and leukocytosis.  The first is in the left upper quadrant and extends around the spleen. The second encases the small bowel loops and extends along the anterior margin of the liver.  These should be amendable to percutaneous drainage under image guidance. 2.  Gastrostomy. 3.  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  Original Report Authenticated By: TKerby Moors M.D.    Ct Abdomen Pelvis W Contrast  05/28/2012  **ADDENDUM** CREATED: 05/28/2012 14:34:16  There is a typographical error in the first paragraph of the abdomen and pelvis portion of the report.  "This measures 4.7 x 810.3 by 9.5 cm."  This should read:   "This measures 4.7 x 8.3 x 9.5 cm."  **END ADDENDUM** SIGNED BY: TAngelita Ingles M.D.   05/28/2012  *RADIOLOGY REPORT*  Clinical Data:  Fever and  leukocytosis  CT CHEST, ABDOMEN AND PELVIS WITH CONTRAST  Technique:  Multidetector CT imaging of the chest, abdomen and pelvis was performed following the standard protocol during bolus administration of intravenous contrast.  Contrast: 122m OMNIPAQUE IOHEXOL 300 MG/ML  SOLN  Comparison:  CT abdomen and pelvis 04/24/2012.  CT CHEST  Findings:  There is a large left pleural effusion.  Moderate right pleural  effusion is present.  Compressive type consolidation and atelectasis is noted within the left midlung.  The heart size is normal.  There is no pericardial effusion.  No enlarged lymph node within the mediastinum or hilar region.  Review of the visualized bony structures is unremarkable.  No worrisome lytic or sclerotic bone lesions.  IMPRESSION:  1.  Bilateral pleural effusions, left greater than right.  CT ABDOMEN AND PELVIS  Findings:  There is no focal liver abnormality.  Gallbladder appears normal.  No biliary dilatation.  The pancreas is unremarkable.  There is a large fluid collection overlying the spleen.  This measures 4.7 x 810.3 by 9.5 cm.  A second fluid collection encases the small bowel loops and extends into the right upper quadrant along the anterior margin of the right hepatic lobe.  The adrenal glands are both normal.  Normal appearance of the right kidney.  Multiple hypodensities are noted within the left kidney which may represent cysts.  Urinary bladder appears normal.  The prostate gland and seminal vesicles are unremarkable.  There is no adenopathy within the upper abdomen.  There is no pelvic or inguinal adenopathy.  The patient has a gastrostomy tube. The small bowel loops have a normal caliber.  The right lower quadrant ileostomy noted.  The patient is status post subtotal colectomy.  Review of the visualized bony structures is unremarkable.  IMPRESSION:  1.  There are two fluid collections within the abdomen. This likely accounts for the patient's persistent fever and leukocytosis.  The first is in the left upper quadrant and extends around the spleen. The second encases the small bowel loops and extends along the anterior margin of the liver.  These should be amendable to percutaneous drainage under image guidance. 2.  Gastrostomy. 3.  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  Original Report Authenticated By: TKerby Moors M.D.    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  Request is made for diagnostic paracentesis.  ULTRASOUND GUIDED DIAGNOSTIC  PARACENTESIS  An ultrasound guided paracentesis was thoroughly discussed with the patient/patient's wife and questions answered.  The benefits, risks, alternatives and complications were also discussed.  The patient/patient's wife understands and wishes to proceed with the procedure.  Written consent was obtained.  Ultrasound was performed to localize and mark an adequate pocket of fluid in the right mid to lower quadrant of the abdomen.  The area was then prepped and draped in the normal sterile fashion.  1% Lidocaine was used for local anesthesia.  Under ultrasound guidance a 19 gauge Yueh catheter was introduced.  Paracentesis was performed.  The catheter was removed and a dressing applied.  Complications:  none  Findings:  A total of approximately 6 cc's of clear, yellow fluid was removed.  A fluid sample was sent for culture/sensitivity.  IMPRESSION: Successful ultrasound guided diagnostic  paracentesis yielding 6 cc's of ascites.  Read by: KRowe Robert P.A.-C  Original Report Authenticated By: Marybelle Killings, M.D.     Recent Labs  05/29/12 0610 05/30/12 0545  WBC 14.6* 15.2*  HGB 7.9* 8.3*  HCT 24.3* 26.2*  PLT 527* 524*    Recent Labs  05/28/12 0538 05/29/12 0610  NA 137 134*  K 4.5 4.1  CL 101 98  GLUCOSE 99 91  BUN 20 20  CREATININE 0.97 1.00  CALCIUM 9.2 9.1   CBG (last 3)  No results found for this basename: GLUCAP,  in the last 72 hours  Wt Readings from Last 3 Encounters:  05/30/12 59.693 kg (131 lb 9.6 oz)  05/07/12 66.2 kg (145 lb 15.1 oz)  04/18/12 71.94 kg (158 lb 9.6 oz)    Physical Exam:  Constitutional: He appears well-developed. He has a sickly appearance.   HENT:  Head:  Normocephalic and atraumatic.  Eyes: Pupils are equal, round, and reactive to light.  Neck: Normal range of motion.  Cardiovascular: Regular rhythm. Tachycardia present. .  Pulmonary/Chest: Effort normal. No respiratory distress.  He has no wheezes. Decreased sounds at the bases. Abdominal: He exhibits minimal distension. Bowel sounds are increased. There is tenderness.  Midline incision with retention sutures in place--incision nearly fully granulated in. Neurological: He is alert.  Basic insight and awareness. Was able to follow basic commands. Hoarse voice. 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 UE grossly  2to 3/5 LUE, 1-2/5 RUE with contracture in Hand (limited flexion). LE 1 to 2+/5 with weakness more in ankle dorsiflexion. DTR's 1+. Diffuse muscle wasting persistent. Good PROM in all 4's.  Skin: Skin is warm and dry. Asp site intact Psychiatric: relaxed  Assessment/Plan:  1. 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 Physiatrist is providing close team supervision and 24 hour management of active medical problems listed below. Physiatrist and rehab team continue to assess barriers to discharge/monitor patient progress toward functional and medical goals.    FIM: FIM - Bathing Bathing Steps Patient Completed: Chest;Right Arm;Abdomen;Front perineal area;Right upper leg;Left upper leg Bathing: 3: Mod-Patient completes 5-7 [id number] parts or 50-74% (supine for LB in w/c for upper body)  FIM - Upper Body Dressing/Undressing Upper body dressing/undressing steps patient completed: Thread/unthread right sleeve of pullover shirt/dresss;Thread/unthread left sleeve of pullover shirt/dress;Put head through opening of pull over shirt/dress;Pull shirt over trunk Upper body dressing/undressing: 5: Supervision: Safety  issues/verbal cues FIM - Lower Body Dressing/Undressing Lower body dressing/undressing steps patient completed: Thread/unthread right pants leg;Thread/unthread left pants leg Lower body dressing/undressing: 2: Max-Patient completed 25-49% of tasks  FIM - Toileting Toileting: 0: Activity did not occur  FIM - TAir cabin crewTransfers: 0-Activity did not occur  FIM - BControl and instrumentation engineerDevices: Arm rests;Sliding board Bed/Chair Transfer: 3: Bed > Chair or W/C: Mod A (lift or lower assist);2: Chair or W/C > Bed: Max A (lift and lower assist)  FIM - Locomotion: Wheelchair Locomotion: Wheelchair: 2: Travels 50 - 149 ft with supervision, cueing or coaxing FIM - Locomotion: Ambulation Ambulation/Gait Assistance: Not tested (comment) Locomotion: Ambulation: 0: Activity did not occur  Comprehension Comprehension Mode: Auditory Comprehension: 6-Follows complex conversation/direction: With extra time/assistive device  Expression Expression Mode: Verbal Expression: 5-Expresses complex 90% of the time/cues < 10% of the time  Social Interaction Social Interaction Mode: Not assessed Social Interaction: 5-Interacts appropriately 90% of the time - Needs monitoring or encouragement for participation or interaction.  Problem Solving  Problem Solving Mode: Not assessed Problem Solving: 4-Solves basic 75 - 89% of the time/requires cueing 10 - 24% of the time  Memory Memory Mode: Not assessed Memory: 4-Recognizes or recalls 75 - 89% of the time/requires cueing 10 - 24% of the time  Medical Problem List and Plan:  1. DVT Prophylaxis/Anticoagulation: Mechanical: Sequential compression devices, below knee Bilateral lower extremities thrombocytopenia resolved.   2. Multifactorial pain: tylenol, local measures, use ultram for more severe pain, HA 3. Mood: neuropsych consult requested. Needs encouragement  -klonopin changed to 0.28m qhs--this seems to work  well 4. Neuropsych: This patient is not capable of making decisions on his/her own behalf.  5. FEN::   Serial labs, adjusting fluids, formula as well. Potassium slightly elevated--recheck Monday  -bolus feed trial, with po attempts as well 6. Bilateral cardioembolic CVA: no blood thinners due to issues with bleeding 7. ABLA:  Aranesp, iv fe++ completed/ aranesp again this week  -consider transfusion  -Recheck iron studies  -serial cbc's- consider transfusion  -no signs of overt blood loss  -see below 8. 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 9. Low grade temp/leukocytosis-   -per-splenic and small bowel fluid collections, large left pleural fluid collection again  -WBC's 15k  -holding further abx pending aspirations of abdominal fluid  -appreciate pulmonary/ccs/ID/surgery assist  -pt denies feelling poorly  -awaiting culture results on perisplenic tap--only 7cc tapped  -f/u CXR today  LOS (Days) 21 A FACE TO FACE EVALUATION WAS PERFORMED  Melenda Bielak T 05/30/2012 7:29 AM

## 2012-05-31 ENCOUNTER — Ambulatory Visit (HOSPITAL_COMMUNITY): Payer: BC Managed Care – PPO | Admitting: *Deleted

## 2012-05-31 ENCOUNTER — Inpatient Hospital Stay (HOSPITAL_COMMUNITY): Payer: BC Managed Care – PPO

## 2012-05-31 LAB — CBC
MCV: 88.6 fL (ref 78.0–100.0)
Platelets: 516 10*3/uL — ABNORMAL HIGH (ref 150–400)
RDW: 17.6 % — ABNORMAL HIGH (ref 11.5–15.5)
WBC: 12.8 10*3/uL — ABNORMAL HIGH (ref 4.0–10.5)

## 2012-05-31 LAB — PROTIME-INR: Prothrombin Time: 16.2 seconds — ABNORMAL HIGH (ref 11.6–15.2)

## 2012-05-31 MED ORDER — LOPERAMIDE HCL 1 MG/5ML PO LIQD
2.0000 mg | ORAL | Status: DC | PRN
  Administered 2012-06-01 – 2012-06-05 (×2): 2 mg
  Filled 2012-05-31: qty 20

## 2012-05-31 MED ORDER — SACCHAROMYCES BOULARDII 250 MG PO CAPS: 250.0000 mg | ORAL_CAPSULE | Freq: Two times a day (BID) | ORAL | Status: DC | PRN

## 2012-05-31 NOTE — Progress Notes (Signed)
NUTRITION FOLLOW UP  DOCUMENTATION CODES  Per approved criteria   -Severe malnutrition in the context of acute illness or injury    Intervention:    Continue the following enteral nutrition regimen:  Continue 237 ml bolus feedings of Vital 1.5 QID. Pt to receive bolus only if oral intake is <50% of meals. Pt should always receive HS bolus. Total bolus regimen (4 x 237 ml daily) will provide: 1420 kcal, 64 grams protein, 724 ml free water.  Continue 30 ml Prostat via tube TID with meals. This provides an additional 300 kcal and 45 grams protein. RD to continue to follow nutrition care plan. Once pt is tolerating this, will add fourth 30 ml Prostat via tube with HS bolus.   Decrease free water to 100 ml q 4 hours while awake - this will provide an additional 600 ml free water daily  Nutrition Dx:   Inadequate oral intake related to poor appetite, early satiety, and lethargy as evidenced by poor meal completion. Improving.  Goal:   Intake to meet >90% of estimated nutrition needs. Met with enteral nutrition and oral intake.  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 Pt with acute respiratory and renal failure with ARDS and septic shock. Pt recently discharged to CIR, however currently admitted as inpatient due to possible aspiration PNA (4/2).   Hospital course nutrition summary:  2/2 - 2/20: permissively underfed with EN  2/21-2/26: advanced to full feeds  2/26 - 3/3: feeds held for surgery  3/4 - 3/11: resumed full feeds  3/11 - 3/12: feeds held for GI bleed  3/13 - 3/24: received full feeds  3/24: TFs discontinued diet initiated; pt with very poor oral intake; tx to rehab 3/25  3/28 - 4/1: increased n/v - pt  refusing almost all meals and supplements; wife declined calorie count, requesting TPN  4/2: NGT placed to suction, surgery ordering TF initiation; tx back to acute with plans for trickle feedings.  4/3-4/4: Trickle feeds of Vital 1.2 @ 30 mL/hr  4/5: PEG placement, resume of Vital 1.2  4/6-4/9: Inconsistent TF infusion and frequent adjustment of rate. Pt has not yet reached >40 mL/hr with tolerance. Wt now 151 lbs.  4/10-11: Pt has advanced as scheduled to 60 mL/hr. Discussed RN. Plan to increase to 70 mL this afternoon.  4/14: Pt has reached goal TF rate of 75 mL/hr with tolerance. PO intake remains insufficient with improvement per pt's report.  4/15: Pt ready to begin transition to cyclic feeds. RD to order Vital 1.2 @ 85 mL/hr x21 hrs daily. Will continue with daily advancements as tolerated and shorter duration of tubefeeds.  4/16: Pt agreeable to change formula to Vital 1.5 @ 80 mL/hr x 18 hrs daily.  4/17: Transfer to CIR. Continues with order of Vital 1.5 @ 80 ml/hr x 18 hrs daily. 4/21: Initiation of 3-day calorie count; continue Vital 1.5 @ 80 ml/hr x 18 hrs daily. 4/22: Day 1 Calorie Count Results -824 kcal (37% of minimum estimated needs) and 13.5 protein (11% of minimum estimated needs) 4/25:  Pt continues cyclic regimen.  Calorie count complete.  Wife and patient informed of results. ecreased cyclic feeds overnight and start transition to bolus feeds. Changes in regimen based on calorie count  results are listed in interventions above. 4/25: Pt tolerated 80 mL bolus Vital 1.5 administered over 30 minutes at 6p.  Resume Vital 1.5 @ 80 mL/hr x 15 hrs from 7p-10a. 4/27 - 4/28: Pt with low grade fevers and n/v. Did not attempt boluses over weekend per wife. 4/29: Received 2 x 80 ml boluses at [facility]; Breakfast bolus skipped 2/2 x-ray. 4/30: Received 2 x 160 ml boluses at [facility]; Breakfast bolus skipped 2/2 thoracentesis. 5/1: Received 3 x 237 ml boluses at Breakfast,  Lunch, and [employer]. 5/3: Nocturnal feedings discontinued. 5/4: Pt eating well enough to the point where he did not need boluses. Nocturnal feedings discontinued. HS bolus added, per wife's request. 5/6: Tolerating boluses well, one 30 ml Prostat daily tolerated well. 5/7: Wife agreeable to providing boluses without pump - discussed with RN.  5/8: Tolerating 30 ml Prostat via tube TID with meals. Not requiring many boluses with meals.  Working towards d/c date of 5/14. Most recent temperature is 98.9 degrees F.   Pt receiving 237 mL bolus of Vital 1.5 administered over 30 minutes at 0800, 1300, 1800, 2130. Pt to receive bolus if meal intake <50%. Continues with free water flushes of 100 ml q 4 hours. Also receiving 30 ml Prostat via tube TID. Wife reports that pt is tolerating this well.  Per chart, developed petechial rash yesterday.  Height: Ht Readings from Last 1 Encounters:  05/09/12 5' 10"  (1.778 m)    Weight Status:   Wt Readings from Last 1 Encounters:  05/31/12 132 lb 4.4 oz (60 kg)  Wt variable  Re-estimated needs:  Kcal: 2200 - 2400 Protein: 120 - 140 grams Fluid: > 2.2 liters  Skin: incisions, ileostomy, stage II pressure ulcer to R buttocks  Diet Order: General   Intake/Output Summary (Last 24 hours) at 05/31/12 1454 Last data filed at 05/31/12 1307  Gross per 24 hour  Intake   1537 ml  Output   1075 ml  Net    462 ml    Last BM: 5/7 (300 ml output today via ostomy yesterday)  Labs:   Recent Labs Lab 05/25/12 0610 05/28/12 0538 05/29/12 0610  NA 136 137 134*  K 4.0 4.5 4.1  CL 98 101 98  CO2 26 26 24   BUN 18 20 20   CREATININE 0.79 0.97 1.00  CALCIUM 8.7 9.2 9.1  GLUCOSE 118* 99 91    CBG (last 3)  No results found for this basename: GLUCAP,  in the last 72 hours  Scheduled Meds: . aspirin  81 mg Per Tube Daily  . clonazePAM  0.5 mg Oral QHS  . feeding supplement  30 mL Per Tube TID WC  . feeding supplement (VITAL 1.5 CAL)  237 mL Per Tube  Custom  . ferrous sulfate  300 mg Per Tube BID WC  . free water  100 mL Per Tube TID WC & HS  . lidocaine  1 patch Transdermal Q24H  . lip balm   Topical BID  . loperamide  2 mg Per Tube QHS  . loperamide  2-4 mg Per Tube UD  . metoprolol tartrate  6.25 mg Per Tube Daily  . multivitamin  5 mL Per Tube Daily  . pantoprazole sodium  40 mg Per Tube Q1200  . saccharomyces boulardii  250 mg Oral BID    Continuous Infusions: none    Inda Coke MS, RD, LDN Pager: [phone] After-hours pager: [phone]

## 2012-05-31 NOTE — Progress Notes (Signed)
INFECTIOUS DISEASE PROGRESS NOTE  ID: Connor Small is a 52 y.o. 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   Coagulopathy   Acute blood loss anemia   Ileostomy in place   Ascites s/p US guide paracentesis (4/2)  Subjective: Without complaints, in PT/OT kitchen  Abtx:  Anti-infectives   Start     Dose/Rate Route Frequency Ordered Stop   05/26/12 0800  fluconazole (DIFLUCAN) 40 MG/ML suspension 100 mg  Status:  Discontinued     100 mg Oral Daily 05/25/12 0655 05/25/12 0656   05/25/12 0700  fluconazole (DIFLUCAN) 40 MG/ML suspension 200 mg  Status:  Discontinued     200 mg Oral  Once 05/25/12 0655 05/25/12 0656   05/16/12 1800  levofloxacin (LEVAQUIN) tablet 750 mg  Status:  Discontinued     750 mg Oral Daily-1800 05/16/12 1551 05/27/12 0655   05/15/12 0830  ciprofloxacin (CIPRO) tablet 500 mg  Status:  Discontinued    Comments:  Begin after urine sample is collected   500 mg Oral 2 times daily 05/15/12 0740 05/16/12 1551      Medications:  Scheduled: . aspirin  81 mg Per Tube Daily  . clonazePAM  0.5 mg Oral QHS  . feeding supplement  30 mL Per Tube TID WC  . feeding supplement (VITAL 1.5 CAL)  237 mL Per Tube Custom  . ferrous sulfate  300 mg Per Tube BID WC  . free water  100 mL Per Tube TID WC & HS  . lidocaine  1 patch Transdermal Q24H  . lip balm   Topical BID  . loperamide  2 mg Per Tube QHS  . loperamide  2-4 mg Per Tube UD  . metoprolol tartrate  6.25 mg Per Tube Daily  . multivitamin  5 mL Per Tube Daily  . pantoprazole sodium  40 mg Per Tube Q1200  . saccharomyces boulardii  250 mg Oral BID    Objective: Vital signs in last 24 hours: Temp:  [97.6 F (36.4 C)-98.9 F (37.2 C)] 98.9 F (37.2 C) (05/08 0550) Pulse Rate:  [120-121] 121 (05/08 0550) Resp:  [18] 18 (05/08 0550) BP: (81-95)/(57-63)  95/63 mmHg (05/08 0550) SpO2:  [98 %-99 %] 98 % (05/08 0550) Weight:  [60 kg (132 lb 4.4 oz)] 60 kg (132 lb 4.4 oz) (05/08 0550)   General appearance: alert, cooperative and no distress GI: normal findings: bowel sounds normal and soft, non-tender  Lab Results  Recent Labs  05/29/12 0610 05/30/12 0545 05/31/12 0540  WBC 14.6* 15.2* 12.8*  HGB 7.9* 8.3* 8.4*  HCT 24.3* 26.2* 25.7*  NA 134*  --   --   K 4.1  --   --   CL 98  --   --   CO2 24  --   --   BUN 20  --   --   CREATININE 1.00  --   --    Liver Panel  Recent Labs  05/29/12 0610  PROT 6.0  ALBUMIN 1.8*  AST 18  ALT 14  ALKPHOS 125*  BILITOT 0.4   Sedimentation Rate No results found for this basename: ESRSEDRATE,  in the last 72 hours C-Reactive Protein No results found for this basename: CRP,  in the last 72 hours  Microbiology: Recent Results (from the past 240 hour(s))  BODY FLUID  CULTURE     Status: None   Collection Time    05/23/12 10:37 AM      Result Value Range Status   Specimen Description FLUID LEFT PLEURAL   Final   Special Requests Normal   Final   Gram Stain     Final   Value: RARE WBC PRESENT, PREDOMINANTLY PMN     NO ORGANISMS SEEN   Culture NO GROWTH 3 DAYS   Final   Report Status 05/26/2012 FINAL   Final  FUNGAL STAIN     Status: None   Collection Time    05/23/12 10:37 AM      Result Value Range Status   Specimen Description FLUID LEFT PLEURAL   Final   Special Requests Normal   Final   Fungal Smear NO YEAST OR FUNGAL ELEMENTS SEEN   Final   Report Status 05/24/2012 FINAL   Final  BODY FLUID CULTURE     Status: None   Collection Time    05/29/12  3:21 PM      Result Value Range Status   Specimen Description PERITONEAL FLUID   Final   Special Requests FROM PERITONEAL CAVITY   Final   Gram Stain     Final   Value: RARE WBC PRESENT, PREDOMINANTLY MONONUCLEAR     NO ORGANISMS SEEN   Culture NO GROWTH   Final   Report Status PENDING   Incomplete    Studies/Results: US  Paracentesis  05/29/2012  *RADIOLOGY REPORT*  Clinical Data: Patient with 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  Request is made for diagnostic paracentesis.  ULTRASOUND GUIDED DIAGNOSTIC  PARACENTESIS  An ultrasound guided paracentesis was thoroughly discussed with the patient/patient's wife and questions answered.  The benefits, risks, alternatives and complications were also discussed.  The patient/patient's wife understands and wishes to proceed with the procedure.  Written consent was obtained.  Ultrasound was performed to localize and mark an adequate pocket of fluid in the right mid to lower quadrant of the abdomen.  The area was then prepped and draped in the normal sterile fashion.  1% Lidocaine was used for local anesthesia.  Under ultrasound guidance a 19 gauge Yueh catheter was introduced.  Paracentesis was performed.  The catheter was removed and a dressing applied.  Complications:  none  Findings:  A total of approximately 6 cc's of clear, yellow fluid was removed.  A fluid sample was sent for culture/sensitivity.  IMPRESSION: Successful ultrasound guided diagnostic  paracentesis yielding 6 cc's of ascites.  Read by: Rowe Robert, P.A.-C   Original Report Authenticated By: Marybelle Killings, M.D.    Dg Chest Port 1 View  05/30/2012  *RADIOLOGY REPORT*  Clinical Data: Bilateral pleural effusions.  PORTABLE CHEST - 1 VIEW  Comparison: 05/25/2012 and CT chest dated 05/28/2012  Findings: The left pleural effusion has significantly increased. Small right effusion is now visible on chest x-ray.  Heart size and pulmonary vascularity are normal.  IMPRESSION:  1.  Increasing large left pleural effusion. 2.  Increasing small right pleural effusion.   Original Report Authenticated By: Lorriane Shire, M.D.      Assessment/Plan: Fever, leukocytosis  Has improved (afeb, wbc  decreased), his abd fluid cx is ngtd.  Would not treat his UA (0-2 WBC/RBC) Continue to watch off anbx Available if questions  Total days of antibiotics: off  levaquin 4-23 ---->5-3  vanco (po) 3-29 ---->  4-5  Aztreonam 3-29 ----> 4-2   Bobby Rumpf Infectious Diseases [phone] 05/31/2012, 10:39 AM   LOS: 22 days

## 2012-05-31 NOTE — Progress Notes (Signed)
Physical Therapy Session Note  Patient Details  Name: Connor Small MRN: [id number] Date of Birth: [birth year]  Today's Date: 05/31/2012 Time: 1130-1155 Time Calculation (min): 25 min  Short Term Goals: Week 3:  PT Short Term Goal 1 (Week 3): Pt will be able to maintain standing x 1 min with +2 assist to aid with postural control and LE strengthening PT Short Term Goal 2 (Week 3): Pt will be able to complete sit to stand from elevated surface 2/3 trials with max A +2 PT Short Term Goal 3 (Week 3): Pt's wife will be able to demonstrate safe slide board transfer/set-up with min verbal cues to prepare for d/c home  Skilled Therapeutic Interventions/Progress Updates:    Family education with pt's wife and daughter to practice bumping w/c up/down curb step to simulate home entry with focus on correct body mechanics, safe positioning, and technique. Wife able to return demonstrate safely after some practice. Pt went to kitchen to check on food he made earlier with OT and set up with family at table.   Therapy Documentation Precautions:  Precautions Precautions: Fall Type of Shoulder Precautions: right shoulder subluxation  Precaution Comments:  ostomy, tachycardia, Afib, peg tube (doesn't wear abd binder anymore) Required Braces or Orthoses: Other Brace/Splint Other Brace/Splint: right wrist splint Restrictions Weight Bearing Restrictions: No   Pain:  Denies pain.   See FIM for current functional status  Therapy/Group: Individual Therapy  [name] [facility] 05/31/2012, 11:57 AM

## 2012-05-31 NOTE — Progress Notes (Signed)
No surgical issues.  Imogene Burn. Georgette Dover, MD, [facility] Surgery  General/ Trauma Surgery  05/31/2012 10:05 AM

## 2012-05-31 NOTE — Progress Notes (Signed)
Social Work Patient ID: Joanna Hews, male   DOB: 1960/05/05, 52 y.o.   MRN: [id number]  Have reviewed team conference with pt, wife and daughter - all very pleased with progress this week and pt happy that target date remains for 06/06/12.  Wife reports she has seen an improvement in amount of effort pt is putting into his mobility and self-care.  In process of setting up DME and follow up services.  Continue to follow.  Adelheid Hoggard, LCSW

## 2012-05-31 NOTE — Progress Notes (Signed)
Patient ID: Connor Small, male   DOB: [birth year], 52 y.o.   MRN: [id number]    Subjective: Pt feels well.  No complaints.   Objective: Vital signs in last 24 hours: Temp:  [97.6 F (36.4 C)-98.9 F (37.2 C)] 98.9 F (37.2 C) (05/08 0550) Pulse Rate:  [120-121] 121 (05/08 0550) Resp:  [18] 18 (05/08 0550) BP: (81-95)/(57-63) 95/63 mmHg (05/08 0550) SpO2:  [98 %-99 %] 98 % (05/08 0550) Weight:  [132 lb 4.4 oz (60 kg)] 132 lb 4.4 oz (60 kg) (05/08 0550) Last BM Date: 05/29/12  Intake/Output from previous day: 05/07 0701 - 05/08 0700 In: 1657 [P.O.:840; NG/GT:550] Out: 1100 [Urine:800; Stool:300] Intake/Output this shift:    PE: Abd: soft, NT, ND, +BS, wounds and ostomy stable  Lab Results:   Recent Labs  05/30/12 0545 05/31/12 0540  WBC 15.2* 12.8*  HGB 8.3* 8.4*  HCT 26.2* 25.7*  PLT 524* 516*   BMET  Recent Labs  05/29/12 0610  NA 134*  K 4.1  CL 98  CO2 24  GLUCOSE 91  BUN 20  CREATININE 1.00  CALCIUM 9.1   PT/INR  Recent Labs  05/31/12 0540  LABPROT 16.2*  INR 1.33   CMP     Component Value Date/Time   NA 134* 05/29/2012 0610   K 4.1 05/29/2012 0610   CL 98 05/29/2012 0610   CO2 24 05/29/2012 0610   GLUCOSE 91 05/29/2012 0610   BUN 20 05/29/2012 0610   CREATININE 1.00 05/29/2012 0610   CALCIUM 9.1 05/29/2012 0610   PROT 6.0 05/29/2012 0610   ALBUMIN 1.8* 05/29/2012 0610   AST 18 05/29/2012 0610   ALT 14 05/29/2012 0610   ALKPHOS 125* 05/29/2012 0610   BILITOT 0.4 05/29/2012 0610   GFRNONAA 83* 05/29/2012 0610   GFRAA >90 05/29/2012 0610   Lipase     Component Value Date/Time   LIPASE 34 04/02/2012 0424       Studies/Results: US Paracentesis  05/29/2012  *RADIOLOGY REPORT*  Clinical Data: Patient with 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  Request is made for diagnostic paracentesis.  ULTRASOUND GUIDED DIAGNOSTIC   PARACENTESIS  An ultrasound guided paracentesis was thoroughly discussed with the patient/patient's wife and questions answered.  The benefits, risks, alternatives and complications were also discussed.  The patient/patient's wife understands and wishes to proceed with the procedure.  Written consent was obtained.  Ultrasound was performed to localize and mark an adequate pocket of fluid in the right mid to lower quadrant of the abdomen.  The area was then prepped and draped in the normal sterile fashion.  1% Lidocaine was used for local anesthesia.  Under ultrasound guidance a 19 gauge Yueh catheter was introduced.  Paracentesis was performed.  The catheter was removed and a dressing applied.  Complications:  none  Findings:  A total of approximately 6 cc's of clear, yellow fluid was removed.  A fluid sample was sent for culture/sensitivity.  IMPRESSION: Successful ultrasound guided diagnostic  paracentesis yielding 6 cc's of ascites.  Read by: Jeananne Rama, P.A.-C   Original Report Authenticated By: Jolaine Click, M.D.    Dg Chest Port 1 View  05/30/2012  *RADIOLOGY REPORT*  Clinical Data: Bilateral pleural effusions.  PORTABLE CHEST - 1 VIEW  Comparison: 05/25/2012 and CT chest dated 05/28/2012  Findings: The left pleural effusion has significantly increased. Small right effusion is now  visible on chest x-ray.  Heart size and pulmonary vascularity are normal.  IMPRESSION:  1.  Increasing large left pleural effusion. 2.  Increasing small right pleural effusion.   Original Report Authenticated By: Francene Boyers, M.D.     Anti-infectives: Anti-infectives   Start     Dose/Rate Route Frequency Ordered Stop   05/26/12 0800  fluconazole (DIFLUCAN) 40 MG/ML suspension 100 mg  Status:  Discontinued     100 mg Oral Daily 05/25/12 0655 05/25/12 0656   05/25/12 0700  fluconazole (DIFLUCAN) 40 MG/ML suspension 200 mg  Status:  Discontinued     200 mg Oral  Once 05/25/12 0655 05/25/12 0656   05/16/12 1800   levofloxacin (LEVAQUIN) tablet 750 mg  Status:  Discontinued     750 mg Oral Daily-1800 05/16/12 1551 05/27/12 0655   05/15/12 0830  ciprofloxacin (CIPRO) tablet 500 mg  Status:  Discontinued    Comments:  Begin after urine sample is collected   500 mg Oral 2 times daily 05/15/12 0740 05/16/12 1551       Assessment/Plan  1. Mild fevers and leukocytosis  Plan: 1. Abdominal fluid shows no growth. 2. UA shows a few bacteria, will send for culture, but not overwhelmingly positive for a UTI 3. Suspect some the low-grade fevers are related to atelectasis.  WBC decrease to 12K today. 4. He needs to follow up with Dr. Johna Sheriff 2-3 weeks after dc.   LOS: 22 days    Connor Small 05/31/2012, 9:06 AM Pager: [phone]

## 2012-05-31 NOTE — Progress Notes (Signed)
Occupational Therapy Session Note  Patient Details  Name: Connor Small MRN: [id number] Date of Birth: 1960/12/09  Today's Date: 05/31/2012 Time: 1000-1100 Time Calculation (min): 60 min  Short Term Goals: Week 1:  OT Short Term Goal 1 (Week 1): Self Feeding:  Patient will feed himself at least 50% of 2 meals per day during this recording period. OT Short Term Goal 1 - Progress (Week 1): Progressing toward goal OT Short Term Goal 2 (Week 1): Grooming:  Patient will perform 2 grooming tasks with min assist to include set up once items needed are provided OT Short Term Goal 2 - Progress (Week 1): Progressing toward goal OT Short Term Goal 3 (Week 1): UB Dressing:  Patient will don shirt in supported sit with max assist OT Short Term Goal 3 - Progress (Week 1): Met OT Short Term Goal 4 (Week 1): Sitting balance:  Patient will sit without back support with mod assist while completing simple BADL task at least 2 days this recording period. OT Short Term Goal 4 - Progress (Week 1): Met OT Short Term Goal 5 (Week 1): UE exercises:  Patient will tolerate BUE exercises at least 4 times during this recording period. OT Short Term Goal 5 - Progress (Week 1): Met  Week 2:  OT Short Term Goal 1 (Week 2): Patient will perform grooming tasks with minimal assistance OT Short Term Goal 1 - Progress (Week 2): Met OT Short Term Goal 2 (Week 2): Patient will perform LB dressing with moderate assistance (pants only) OT Short Term Goal 2 - Progress (Week 2): Met OT Short Term Goal 3 (Week 2): Patient will be able to direct care with slide board transfer with min verbal cues OT Short Term Goal 3 - Progress (Week 2): Met OT Short Term Goal 4 (Week 2): Patient will be able to direct UE exercises with min verbal cues OT Short Term Goal 4 - Progress (Week 2): Met  Week 3:  OT Short Term Goal 1 (Week 3): Short Term Goals = Long Term Goals  Skilled Therapeutic Interventions/Progress Updates:  Patient with no  complaints of pain Patient found supine in bed. Patient engaged in bed mobility, then transferred edge of bed -> w/c using slide board with moderate assistance from therapist. From there, therapist propelled patient -> ADL apartment for co-treatment with recreational therapist. In kitchen, focused on functional use of bilateral UEs and overall activity tolerance/endurance in order for patient to perform simple meal prep. Patient performed simple meal prep at w/c level. Patients wife and daughter present during entire session. At end of session, left patient with wife and daughter.   Precautions:  Precautions Precautions: Fall Type of Shoulder Precautions: right shoulder subluxation  Precaution Comments:  ostomy, tachycardia, Afib, peg tube (doesn't wear abd binder anymore) Required Braces or Orthoses: Other Brace/Splint Other Brace/Splint: right wrist splint Restrictions Weight Bearing Restrictions: No  See FIM for current functional status  Mechele Kittleson 05/31/2012, 11:18 AM

## 2012-05-31 NOTE — Progress Notes (Signed)
Physical Therapy Session Note  Patient Details  Name: Connor Small MRN: [id number] Date of Birth: Oct 07, 1960  Today's Date: 05/31/2012 Time: 8768-1157 Time Calculation (min): 56 min  Short Term Goals: Week 3:  PT Short Term Goal 1 (Week 3): Pt will be able to maintain standing x 1 min with +2 assist to aid with postural control and LE strengthening PT Short Term Goal 2 (Week 3): Pt will be able to complete sit to stand from elevated surface 2/3 trials with max A +2 PT Short Term Goal 3 (Week 3): Pt's wife will be able to demonstrate safe slide board transfer/set-up with min verbal cues to prepare for d/c home  Skilled Therapeutic Interventions/Progress Updates:   Treatment focused on overall activity tolerance, neuro re-ed, transfers, and sit to stands. Propelled w/c down to therapy gym with extra time and encouragement. Wife had measured SUV height seat to ground and it is 30"; demonstrated height on mat with w/c and slideboard and pt/family in agreement this is too unlevel of a transfer to be safe at this time. Plan is to use daughter's car (sedan) and will schedule real car transfer in AM tomorrow.   Transferred with slideboard with overall min A to/from mat (level surface) with cues for head/hips relationship and emphasis on pt pushing through LE's to aid with transfer. Sit to stands with Ethelene Hal and elevated surface with mod/max A +2 x 3 reps (longest over 1:30). On 3rd stand, pt able to bring each LE forward and back twice in preparation for gait training. Will plan to attempt with maxi sky and Harmon Pier walker in future session.  Therapy Documentation Precautions:  Precautions Precautions: Fall Type of Shoulder Precautions: right shoulder subluxation  Precaution Comments:  ostomy, tachycardia, Afib, peg tube (doesn't wear abd binder anymore) Required Braces or Orthoses: Other Brace/Splint Other Brace/Splint: right wrist splint Restrictions Weight Bearing Restrictions: No  Pain:  No  pain initially but developed headache by end of session. RN notified for pain medication.   See FIM for current functional status  Therapy/Group: Individual Therapy  Connor Small [facility] 05/31/2012, 3:52 PM

## 2012-05-31 NOTE — Progress Notes (Signed)
Subjective/Complaints: Developed petechial rash yesterday. Denies any irritation, itching. Slept well again A 12 point review of systems has been performed and if not noted above is otherwise negative.   Objective: Vital Signs: Blood pressure 95/63, pulse 121, temperature 98.9 F (37.2 C), temperature source Oral, resp. rate 18, height 5' 10"  (1.778 m), weight 60 kg (132 lb 4.4 oz), SpO2 98.00%. 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  Request is made for diagnostic paracentesis.  ULTRASOUND GUIDED DIAGNOSTIC  PARACENTESIS  An ultrasound guided paracentesis was thoroughly discussed with the patient/patient's wife and questions answered.  The benefits, risks, alternatives and complications were also discussed.  The patient/patient's wife understands and wishes to proceed with the procedure.  Written consent was obtained.  Ultrasound was performed to localize and mark an adequate pocket of fluid in the right mid to lower quadrant of the abdomen.  The area was then prepped and draped in the normal sterile fashion.  1% Lidocaine was used for local anesthesia.  Under ultrasound guidance a 19 gauge Yueh catheter was introduced.  Paracentesis was performed.  The catheter was removed and a dressing applied.  Complications:  none  Findings:  A total of approximately 6 cc's of clear, yellow fluid was removed.  A fluid sample was sent for culture/sensitivity.  IMPRESSION: Successful ultrasound guided diagnostic  paracentesis yielding 6 cc's of ascites.  Read by: Rowe Robert, P.A.-C   Original Report Authenticated By: Marybelle Killings, M.D.    Dg Chest Port 1 View  05/30/2012  *RADIOLOGY REPORT*  Clinical Data: Bilateral pleural effusions.  PORTABLE CHEST - 1 VIEW  Comparison: 05/25/2012 and CT chest dated  05/28/2012  Findings: The left pleural effusion has significantly increased. Small right effusion is now visible on chest x-ray.  Heart size and pulmonary vascularity are normal.  IMPRESSION:  1.  Increasing large left pleural effusion. 2.  Increasing small right pleural effusion.   Original Report Authenticated By: Lorriane Shire, M.D.     Recent Labs  05/30/12 0545 05/31/12 0540  WBC 15.2* 12.8*  HGB 8.3* 8.4*  HCT 26.2* 25.7*  PLT 524* 516*    Recent Labs  05/29/12 0610  NA 134*  K 4.1  CL 98  GLUCOSE 91  BUN 20  CREATININE 1.00  CALCIUM 9.1   CBG (last 3)  No results found for this basename: GLUCAP,  in the last 72 hours  Wt Readings from Last 3 Encounters:  05/31/12 60 kg (132 lb 4.4 oz)  05/07/12 66.2 kg (145 lb 15.1 oz)  04/18/12 71.94 kg (158 lb 9.6 oz)    Physical Exam:  Constitutional: He appears well-developed. He has a sickly appearance.   HENT:  Head: Normocephalic and atraumatic.  Eyes: Pupils are equal, round, and reactive to light.  Neck: Normal range of motion.  Cardiovascular: Regular rhythm. Tachycardia present. .  Pulmonary/Chest: Effort normal. No respiratory distress.  He has no wheezes. Decreased sounds at the bases. Abdominal: He exhibits minimal distension. Bowel sounds are increased. There is tenderness.  Midline incision with retention sutures in place--incision nearly fully granulated in. Neurological: He is alert.  Basic insight and awareness. Was able to follow basic commands. Hoarse voice. 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 UE grossly  2to 3/5 LUE, 1-2/5 RUE with contracture in Hand (limited flexion). LE 1 to 2+/5 with  weakness more in ankle dorsiflexion. DTR's 1+. Diffuse muscle wasting persistent. Good PROM in all 4's.  Skin: petechial rash along back, both ankles, left elbow. Areas slightly warmer Psychiatric: relaxed  Assessment/Plan:  1. Functional deficits secondary to severe deconditioning, critical illness  neuropathy/myopathy after multiple medical which require 3+ hours per day of interdisciplinary therapy in a comprehensive inpatient rehab setting. Physiatrist is providing close team supervision and 24 hour management of active medical problems listed below. Physiatrist and rehab team continue to assess barriers to discharge/monitor patient progress toward functional and medical goals.    FIM: FIM - Bathing Bathing Steps Patient Completed: Chest;Right Arm;Abdomen;Front perineal area;Right upper leg;Left upper leg Bathing: 3: Mod-Patient completes 5-7 53f10 parts or 50-74% (supine for LB in w/c for upper body)  FIM - Upper Body Dressing/Undressing Upper body dressing/undressing steps patient completed: Thread/unthread right sleeve of pullover shirt/dresss;Thread/unthread left sleeve of pullover shirt/dress;Put head through opening of pull over shirt/dress Upper body dressing/undressing: 4: Min-Patient completed 75 plus % of tasks FIM - Lower Body Dressing/Undressing Lower body dressing/undressing steps patient completed: Thread/unthread right pants leg;Thread/unthread left pants leg;Don/Doff right shoe;Don/Doff left shoe Lower body dressing/undressing: 2: Max-Patient completed 25-49% of tasks  FIM - Toileting Toileting: 0: Activity did not occur  FIM - TAir cabin crewTransfers: 0-Activity did not occur  FIM - BControl and instrumentation engineerDevices: Arm rests;Sliding board Bed/Chair Transfer: 3: Bed > Chair or W/C: Mod A (lift or lower assist);2: Chair or W/C > Bed: Max A (lift and lower assist)  FIM - Locomotion: Wheelchair Locomotion: Wheelchair: 1: Total Assistance/staff pushes wheelchair (Pt<25%) FIM - Locomotion: Ambulation Ambulation/Gait Assistance: Not tested (comment) Locomotion: Ambulation: 0: Activity did not occur  Comprehension Comprehension Mode: Auditory Comprehension: 6-Follows complex conversation/direction: With extra time/assistive  device  Expression Expression Mode: Verbal Expression: 5-Expresses complex 90% of the time/cues < 10% of the time  Social Interaction Social Interaction Mode: Not assessed Social Interaction: 5-Interacts appropriately 90% of the time - Needs monitoring or encouragement for participation or interaction.  Problem Solving Problem Solving Mode: Not assessed Problem Solving: 5-Solves basic 90% of the time/requires cueing < 10% of the time  Memory Memory Mode: Not assessed Memory: 4-Recognizes or recalls 75 - 89% of the time/requires cueing 10 - 24% of the time  Medical Problem List and Plan:  1. DVT Prophylaxis/Anticoagulation: Mechanical: Sequential compression devices, below knee Bilateral lower extremities thrombocytopenia resolved.   2. Multifactorial pain: tylenol, local measures, use ultram for more severe pain, HA 3. Mood: neuropsych consult requested. Needs encouragement  -klonopin changed to 0.571mqhs--this seems to work well 4. Neuropsych: This patient is not capable of making decisions on his/her own behalf.  5. FEN::   Serial labs, adjusting fluids, formula as well. Potassium slightly elevated--recheck Monday  -bolus feed trial, with po attempts as well 6. Bilateral cardioembolic CVA: no blood thinners due to issues with bleeding 7. Anemia---likely ACD based on labwork.:  No further aranesp at this time  -counts climbing slightly  -no signs of overt blood loss  -continue fes04 8. 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 9. Low grade temp/leukocytosis-   -peri-splenic and small bowel fluid collections,  pleural fluid collections  -WBC's 12.8k  -holding further abx pending aspirations of abdominal fluid- no organisms seen. cx pending   -appreciate pulmonary/ccs/ID/surgery assist  -pt denies feelling poorly  -awaiting culture results on perisplenic  tap--only 7cc  tapped  -f/u CXR with increasing effusions 10. Petechial rash: ?from IV contrast.  -no change from yesterday afternoon per wife.  -labwork stable  -follow clinically for now  LOS (Days) 22 A FACE TO FACE EVALUATION WAS PERFORMED  Libero Puthoff T 05/31/2012 7:25 AM

## 2012-05-31 NOTE — Plan of Care (Signed)
Problem: RH BOWEL ELIMINATION Goal: RH STG MANAGE BOWEL W/EQUIPMENT W/ASSISTANCE STG Manage Bowel With Equipment With max Assistance of caregiver for ostomy care  Outcome: Progressing Wife helps patient; plans to maintain at home

## 2012-05-31 NOTE — Progress Notes (Signed)
Recreational Therapy Session Note  Patient Details  Name: Connor Small MRN: [id number] Date of Birth: 01/19/1961 Today's Date: 05/31/2012 Time:  10-1103 Pain: no c/o Skilled Therapeutic Interventions/Progress Updates: Pt participated in kitchen activity of choice, baking potatoes, peppers & onions wrapped in foil.  Pt sat w/c level using various AE to wash & cut potatoes with set up assist/supervision-min assist using BUE's.  Pt used BUE's to fold/roll down edges of foil prior to baking with min-mod assist for use of RUE.  Pt talkative throughout session and engaged in activity with little cuing for encouragement.  Pt's wife & daughter present and participatory.  Discussion with all 3 about ways to adapt kitchen activities for pt at home, all stated understanding.  Therapy/Group: Co-Treatment  Denica Web 05/31/2012, 1:29 PM

## 2012-06-01 ENCOUNTER — Inpatient Hospital Stay (HOSPITAL_COMMUNITY): Payer: BC Managed Care – PPO

## 2012-06-01 ENCOUNTER — Inpatient Hospital Stay (HOSPITAL_COMMUNITY): Payer: BC Managed Care – PPO | Admitting: Occupational Therapy

## 2012-06-01 DIAGNOSIS — R21 Rash and other nonspecific skin eruption: Secondary | ICD-10-CM

## 2012-06-01 LAB — COMPREHENSIVE METABOLIC PANEL
AST: 18 U/L (ref 0–37)
Albumin: 2 g/dL — ABNORMAL LOW (ref 3.5–5.2)
Alkaline Phosphatase: 106 U/L (ref 39–117)
BUN: 26 mg/dL — ABNORMAL HIGH (ref 6–23)
Chloride: 99 mEq/L (ref 96–112)
Potassium: 4.2 mEq/L (ref 3.5–5.1)
Total Bilirubin: 0.4 mg/dL (ref 0.3–1.2)

## 2012-06-01 LAB — CBC
HCT: 26.7 % — ABNORMAL LOW (ref 39.0–52.0)
Platelets: 555 10*3/uL — ABNORMAL HIGH (ref 150–400)
RBC: 2.93 MIL/uL — ABNORMAL LOW (ref 4.22–5.81)
RDW: 17.6 % — ABNORMAL HIGH (ref 11.5–15.5)
WBC: 14.1 10*3/uL — ABNORMAL HIGH (ref 4.0–10.5)

## 2012-06-01 LAB — PLATELET FUNCTION ASSAY

## 2012-06-01 MED ORDER — FREE WATER
150.0000 mL | Freq: Three times a day (TID) | Status: DC
  Administered 2012-06-01 – 2012-06-06 (×19): 150 mL

## 2012-06-01 NOTE — Progress Notes (Signed)
INFECTIOUS DISEASE PROGRESS NOTE  ID: Connor Small is a 52 y.o. 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   Coagulopathy   Acute blood loss anemia   Ileostomy in place   Ascites s/p US guide paracentesis (4/2)  Subjective: Without complaints- no dysphagia, no sob, no cough.   Abtx:  Anti-infectives   Start     Dose/Rate Route Frequency Ordered Stop   05/26/12 0800  fluconazole (DIFLUCAN) 40 MG/ML suspension 100 mg  Status:  Discontinued     100 mg Oral Daily 05/25/12 0655 05/25/12 0656   05/25/12 0700  fluconazole (DIFLUCAN) 40 MG/ML suspension 200 mg  Status:  Discontinued     200 mg Oral  Once 05/25/12 0655 05/25/12 0656   05/16/12 1800  levofloxacin (LEVAQUIN) tablet 750 mg  Status:  Discontinued     750 mg Oral Daily-1800 05/16/12 1551 05/27/12 0655   05/15/12 0830  ciprofloxacin (CIPRO) tablet 500 mg  Status:  Discontinued    Comments:  Begin after urine sample is collected   500 mg Oral 2 times daily 05/15/12 0740 05/16/12 1551      Medications:  Scheduled: . aspirin  81 mg Per Tube Daily  . clonazePAM  0.5 mg Oral QHS  . feeding supplement  30 mL Per Tube TID WC  . feeding supplement (VITAL 1.5 CAL)  237 mL Per Tube Custom  . ferrous sulfate  300 mg Per Tube BID WC  . free water  150 mL Per Tube TID WC & HS  . lidocaine  1 patch Transdermal Q24H  . lip balm   Topical BID  . metoprolol tartrate  6.25 mg Per Tube Daily  . multivitamin  5 mL Per Tube Daily  . pantoprazole sodium  40 mg Per Tube Q1200    Objective: Vital signs in last 24 hours: Temp:  [98.4 F (36.9 C)-99 F (37.2 C)] 99 F (37.2 C) (05/09 0816) Pulse Rate:  [110-116] 110 (05/09 0816) Resp:  [18] 18 (05/09 0408) BP: (90-135)/(58-71) 110/70 mmHg (05/09 0816) SpO2:  [96 %] 96 % (05/09 0408) Weight:  [60 kg (132 lb 4.4 oz)-73.1 kg  (161 lb 2.5 oz)] 73.1 kg (161 lb 2.5 oz) (05/09 0700)   General appearance: alert, cooperative and no distress Resp: clear to auscultation bilaterally Cardio: regular rate and rhythm GI: normal findings: bowel sounds normal and soft, non-tender Skin: petechiae - hand(s) bilateral, feet bilateral  Lab Results  Recent Labs  05/31/12 0540 06/01/12 0615  WBC 12.8* 14.1*  HGB 8.4* 8.3*  HCT 25.7* 26.7*  NA  --  135  K  --  4.2  CL  --  99  CO2  --  21  BUN  --  26*  CREATININE  --  1.10   Liver Panel  Recent Labs  06/01/12 0615  PROT 6.4  ALBUMIN 2.0*  AST 18  ALT 11  ALKPHOS 106  BILITOT 0.4   Sedimentation Rate No results found for this basename: ESRSEDRATE,  in the last 72 hours C-Reactive Protein No results found for this basename: CRP,  in the last 72 hours  Microbiology: Recent Results (from the past 240 hour(s))  BODY FLUID CULTURE     Status: None   Collection Time    05/23/12 10:37 AM      Result Value Range  Status   Specimen Description FLUID LEFT PLEURAL   Final   Special Requests Normal   Final   Gram Stain     Final   Value: RARE WBC PRESENT, PREDOMINANTLY PMN     NO ORGANISMS SEEN   Culture NO GROWTH 3 DAYS   Final   Report Status 05/26/2012 FINAL   Final  FUNGAL STAIN     Status: None   Collection Time    05/23/12 10:37 AM      Result Value Range Status   Specimen Description FLUID LEFT PLEURAL   Final   Special Requests Normal   Final   Fungal Smear NO YEAST OR FUNGAL ELEMENTS SEEN   Final   Report Status 05/24/2012 FINAL   Final  BODY FLUID CULTURE     Status: None   Collection Time    05/29/12  3:21 PM      Result Value Range Status   Specimen Description PERITONEAL FLUID   Final   Special Requests FROM PERITONEAL CAVITY   Final   Gram Stain     Final   Value: RARE WBC PRESENT, PREDOMINANTLY MONONUCLEAR     NO ORGANISMS SEEN   Culture NO GROWTH 2 DAYS   Final   Report Status PENDING   Incomplete    Studies/Results: No results  found.   Assessment/Plan: Rash Abd fluid collection  Except for rash, he is doing well.  I would attribute his rash to drug exposure (he links it to contrast).  Would not treat his UA (0-2 WBC/RBC)   Continue to watch off anbx  He denies bug bites (getting RMSF in hospital would seem very unlikely!!)  Available if questions over weekend  Total days of antibiotics: off  levaquin 4-23 ---->5-3  vanco (po) 3-29 ----> 4-5  Aztreonam 3-29 ----> 4-2     Bobby Rumpf Infectious Diseases [phone] 06/01/2012, 5:16 PM   LOS: 23 days

## 2012-06-01 NOTE — Progress Notes (Signed)
Subjective/Complaints: No complaints. Petechial rash on hands now. A 12 point review of systems has been performed and if not noted above is otherwise negative.   Objective: Vital Signs: Blood pressure 135/71, pulse 116, temperature 98.6 F (37 C), temperature source Oral, resp. rate 18, height 5' 10"  (1.778 m), weight 73.1 kg (161 lb 2.5 oz), SpO2 96.00%. No results found.  Recent Labs  05/31/12 0540 06/01/12 0615  WBC 12.8* 14.3*  HGB 8.4* 8.4*  HCT 25.7* 26.9*  PLT 516* 534*    Recent Labs  06/01/12 0615  NA 135  K 4.2  CL 101  GLUCOSE 101*  BUN 26*  CREATININE 1.15  CALCIUM 9.1   CBG (last 3)  No results found for this basename: GLUCAP,  in the last 72 hours  Wt Readings from Last 3 Encounters:  06/01/12 73.1 kg (161 lb 2.5 oz)  05/07/12 66.2 kg (145 lb 15.1 oz)  04/18/12 71.94 kg (158 lb 9.6 oz)    Physical Exam:  Constitutional: He appears well-developed. He has a sickly appearance.   HENT:  Head: Normocephalic and atraumatic.  Eyes: Pupils are equal, round, and reactive to light.  Neck: Normal range of motion.  Cardiovascular: Regular rhythm. Tachycardia present. .  Pulmonary/Chest: Effort normal. No respiratory distress.  He has no wheezes. Decreased sounds at the bases. Abdominal: He exhibits minimal distension. Bowel sounds are increased. There is tenderness.  Midline incision with retention sutures in place--incision nearly fully granulated in. Neurological: He is alert.  Basic insight and awareness. Was able to follow basic commands. Hoarse voice. 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 UE grossly  2to 3/5 LUE, 1-2/5 RUE with contracture in Hand (limited flexion). LE 1 to 2+/5 with weakness more in ankle dorsiflexion. DTR's 1+. Diffuse muscle wasting persistent. Good PROM in all 4's.  Skin: petechial rash along back, both ankles, left elbow. Areas slightly warmer Psychiatric: relaxed  Assessment/Plan:  1. Functional deficits secondary to severe  deconditioning, critical illness neuropathy/myopathy after multiple medical which require 3+ hours per day of interdisciplinary therapy in a comprehensive inpatient rehab setting. Physiatrist is providing close team supervision and 24 hour management of active medical problems listed below. Physiatrist and rehab team continue to assess barriers to discharge/monitor patient progress toward functional and medical goals.    FIM: FIM - Bathing Bathing Steps Patient Completed: Chest;Right Arm;Abdomen;Front perineal area;Right upper leg;Left upper leg Bathing: 3: Mod-Patient completes 5-7 23f10 parts or 50-74% (supine for LB in w/c for upper body)  FIM - Upper Body Dressing/Undressing Upper body dressing/undressing steps patient completed: Thread/unthread right sleeve of pullover shirt/dresss;Thread/unthread left sleeve of pullover shirt/dress;Put head through opening of pull over shirt/dress Upper body dressing/undressing: 4: Min-Patient completed 75 plus % of tasks FIM - Lower Body Dressing/Undressing Lower body dressing/undressing steps patient completed: Thread/unthread right pants leg;Thread/unthread left pants leg;Don/Doff right shoe;Don/Doff left shoe Lower body dressing/undressing: 2: Max-Patient completed 25-49% of tasks  FIM - Toileting Toileting: 0: Activity did not occur  FIM - TAir cabin crewTransfers: 0-Activity did not occur  FIM - BControl and instrumentation engineerDevices: Arm rests;Sliding board Bed/Chair Transfer: 4: Bed > Chair or W/C: Min A (steadying Pt. > 75%);4: Chair or W/C > Bed: Min A (steadying Pt. > 75%) (level surface on mat)  FIM - Locomotion: Wheelchair Locomotion: Wheelchair: 2: Travels 50 - 149 ft with supervision, cueing or coaxing FIM - Locomotion: Ambulation Ambulation/Gait Assistance: Not tested (comment) Locomotion: Ambulation: 0: Activity did not occur  Comprehension Comprehension Mode:  Auditory Comprehension: 6-Follows  complex conversation/direction: With extra time/assistive device  Expression Expression Mode: Verbal Expression: 5-Expresses complex 90% of the time/cues < 10% of the time  Social Interaction Social Interaction Mode: Not assessed Social Interaction: 5-Interacts appropriately 90% of the time - Needs monitoring or encouragement for participation or interaction.  Problem Solving Problem Solving Mode: Not assessed Problem Solving: 5-Solves basic 90% of the time/requires cueing < 10% of the time  Memory Memory Mode: Not assessed Memory: 4-Recognizes or recalls 75 - 89% of the time/requires cueing 10 - 24% of the time  Medical Problem List and Plan:  1. DVT Prophylaxis/Anticoagulation: Mechanical: Sequential compression devices, below knee Bilateral lower extremities thrombocytopenia resolved.   2. Multifactorial pain: tylenol, local measures, use ultram for more severe pain, HA 3. Mood: neuropsych consult requested. Needs encouragement  -klonopin changed to 0.30m qhs--this seems to work well 4. Neuropsych: This patient is not capable of making decisions on his/her own behalf.  5. FEN::   Serial labs, adjusting fluids, formula as well. Labs good today. Increase h2O boluses  -bolus feeds, with po attempts as well 6. Bilateral cardioembolic CVA: no blood thinners due to issues with bleeding 7. Anemia---likely ACD based on labwork.:  No further aranesp at this time  -counts climbing slightly  -no signs of overt blood loss  -continue fes04 8. 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 9. Low grade temp/leukocytosis-   -peri-splenic and small bowel fluid collections,  pleural fluid collections  -WBC's 12-14k  -holding further abx pending aspirations of abdominal fluid- no organisms seen. cx pending   -appreciate pulmonary/ccs/ID/surgery assist  -pt denies feelling  poorly  -awaiting final culture results on perisplenic tap--only 7cc tapped  -f/u CXR with increasing effusions---ccs to determine if/when they will re-tap 10. Petechial rash: some involvement of feet, hands. Denies pain/pruritrus with it  -likely drug reaction (?CT contrast, aspiration med)  -labwork remains stable  -follow clinically for now  LOS (Days) 23 A FACE TO FACE EVALUATION WAS PERFORMED  SWARTZ,ZACHARY T 06/01/2012 7:48 AM

## 2012-06-01 NOTE — Progress Notes (Signed)
Occupational Therapy Session Note  Patient Details  Name: Connor Small MRN: [id number] Date of Birth: 08/11/1960  Today's Date: 06/01/2012 Time: 2620-3559 Time Calculation (min): 55 min  Short Term Goals: Week 1:  OT Short Term Goal 1 (Week 1): Self Feeding:  Patient will feed himself at least 50% of 2 meals per day during this recording period. OT Short Term Goal 1 - Progress (Week 1): Progressing toward goal OT Short Term Goal 2 (Week 1): Grooming:  Patient will perform 2 grooming tasks with min assist to include set up once items needed are provided OT Short Term Goal 2 - Progress (Week 1): Progressing toward goal OT Short Term Goal 3 (Week 1): UB Dressing:  Patient will don shirt in supported sit with max assist OT Short Term Goal 3 - Progress (Week 1): Met OT Short Term Goal 4 (Week 1): Sitting balance:  Patient will sit without back support with mod assist while completing simple BADL task at least 2 days this recording period. OT Short Term Goal 4 - Progress (Week 1): Met OT Short Term Goal 5 (Week 1): UE exercises:  Patient will tolerate BUE exercises at least 4 times during this recording period. OT Short Term Goal 5 - Progress (Week 1): Met  Week 2:  OT Short Term Goal 1 (Week 2): Patient will perform grooming tasks with minimal assistance OT Short Term Goal 1 - Progress (Week 2): Met OT Short Term Goal 2 (Week 2): Patient will perform LB dressing with moderate assistance (pants only) OT Short Term Goal 2 - Progress (Week 2): Met OT Short Term Goal 3 (Week 2): Patient will be able to direct care with slide board transfer with min verbal cues OT Short Term Goal 3 - Progress (Week 2): Met OT Short Term Goal 4 (Week 2): Patient will be able to direct UE exercises with min verbal cues OT Short Term Goal 4 - Progress (Week 2): Met  Week 3:  OT Short Term Goal 1 (Week 3): Short Term Goals = Long Term Goals  Skilled Therapeutic Interventions/Progress Updates:  Patient with no  complaints of pain. Patient seen for NMR->RUE using 1.5lb weight to help strengthen shoulder and tricep. Patient then engaged in bed mobility with min assist to sit edge of bed for UB/LB dressing. Patient able to donn shirt with supervision and thread bilateral feet into pants, patient only required assistance with donning of shoes and pulling pants up to waist. Wife assisted patient with donning of pants. Wife and daughter then assisted patient with slide board transfer from edge of bed -> w/c. Wife and daughter assisted patient with positioning in w/c. Left patient in w/c with wife and daughter. Main focus this session was family education. Discussed showering with patient, patient refused.  Precautions:  Precautions Precautions: Fall Type of Shoulder Precautions: right shoulder subluxation  Precaution Comments:  ostomy, tachycardia, Afib, peg tube (doesn't wear abd binder anymore) Required Braces or Orthoses: Other Brace/Splint Other Brace/Splint: right wrist splint Restrictions Weight Bearing Restrictions: No  See FIM for current functional status  Therapy/Group: Individual Therapy  Aynsley Fleet 06/01/2012, 12:00 PM

## 2012-06-01 NOTE — Progress Notes (Signed)
Physical Therapy Weekly Progress Note  Patient Details  Name: Connor Small MRN: [id number] Date of Birth: 09-19-1960  Today's Date: 06/01/2012  Session #1 Time: 1030-1110 Time Calculation (min): 40 min Individual therapy; Denies pain. Session focused on family education with pt's wife and daughter for real car transfer using slideboard in preparation for d/c home next week. Family and pt able to to successfully complete transfer in/out of car independently. Pt able to assist with directing correct set up as well.   Session #2 Time: 1400-1500 (60 min) Individual therapy; Denies pain. Session focused on neuro re-ed for postural control and of LE's in standing, during gait with Fara Boros and ascending/descending 1 step using Maxi Sky lift/sling. Pt able to gait x 15' x 2 with seated rest break; verbal and manual cues for facilitation at trunk for upright posture (flexed at knees and hips/trunk). Pt completed slide board transfer from mat to w/c with almost close S/steady A needed to initiate first scoot and to manage slideboard. Propelled himself to the kinetron and completed seated therex to LE's on resistance of 40 for 3 sets of 10 reps BLE from w/c. Pt worked very hard during session and showed more emotion/personality about his progress. Wife present to observe and also very excited.     Patient has met 3 of 3 short term goals. Pt is making good progress with overall mobility. Pt can be min to mod A with slide board transfers on level surfaces and S with w/c propulsion (BLE and LUE) short distances on level surfaces. In therapy, we have been working on sit to stands from elevated surface using Fara Boros with +2 assist and used Fara Boros with [facility] lift for gait today.  Family education has been ongoing with pt's wife and daughter. Completed real car transfer with family today successfully. Family is checked off on basic slide board transfers and positioning of patient in w/c as well as  bumping w/c up/down step for home entry.  Patient continues to demonstrate the following deficits: decreased activity tolerance, decreased strength, quadriplegia (R sided weakness more significant), abnormal tone, decreased balance, decreased postural control  and therefore will continue to benefit from skilled PT intervention to enhance overall performance with activity tolerance, balance, postural control, ability to compensate for deficits and functional use of  right upper extremity and right lower extremity.  Patient progressing toward long term goals..  Continue plan of care.  PT Short Term Goals Week 3:  PT Short Term Goal 1 (Week 3): Pt will be able to maintain standing x 1 min with +2 assist to aid with postural control and LE strengthening PT Short Term Goal 1 - Progress (Week 3): Met PT Short Term Goal 2 (Week 3): Pt will be able to complete sit to stand from elevated surface 2/3 trials with max A +2 PT Short Term Goal 2 - Progress (Week 3): Met PT Short Term Goal 3 (Week 3): Pt's wife will be able to demonstrate safe slide board transfer/set-up with min verbal cues to prepare for d/c home PT Short Term Goal 3 - Progress (Week 3): Met Week 4:  PT Short Term Goal 1 (Week 4): = LTGs  Skilled Therapeutic Interventions/Progress Updates:  Ambulation/gait training;Balance/vestibular training;Cognitive remediation/compensation;Discharge planning;Disease management/prevention;DME/adaptive equipment instruction;Functional electrical stimulation;Functional mobility training;Neuromuscular re-education;Pain management;Patient/family education;Psychosocial support;Skin care/wound management;Splinting/orthotics;Stair training;Therapeutic Activities;Therapeutic Exercise;UE/LE Strength taining/ROM;UE/LE Coordination activities;Wheelchair propulsion/positioning;Community reintegration   Therapy Documentation Precautions:  Precautions Precautions: Fall Type of Shoulder Precautions: right shoulder  subluxation  Precaution Comments:  ostomy, tachycardia, Afib, peg tube (doesn't wear abd binder anymore) Required Braces or Orthoses: Other Brace/Splint Other Brace/Splint: right wrist splint Restrictions Weight Bearing Restrictions: No   See FIM for current functional status  Therapy/Group: Individual Therapy  Philip Aspen, PT, DPT  06/01/2012, 3:46 PM

## 2012-06-02 ENCOUNTER — Inpatient Hospital Stay (HOSPITAL_COMMUNITY): Payer: BC Managed Care – PPO | Admitting: Physical Therapy

## 2012-06-02 ENCOUNTER — Inpatient Hospital Stay (HOSPITAL_COMMUNITY): Payer: BC Managed Care – PPO | Admitting: *Deleted

## 2012-06-02 DIAGNOSIS — G7281 Critical illness myopathy: Secondary | ICD-10-CM

## 2012-06-02 LAB — BODY FLUID CULTURE: Culture: NO GROWTH

## 2012-06-02 LAB — URINE CULTURE: Culture: NO GROWTH

## 2012-06-02 NOTE — Progress Notes (Signed)
Physical Therapy Session Note  Patient Details  Name: Connor Small MRN: [id number] Date of Birth: 19-Sep-1960  Today's Date: 06/02/2012 Time: 0805-0855 Time Calculation (min): 50 min  Short Term Goals: Week 3:  PT Short Term Goal 1 (Week 3): Pt will be able to maintain standing x 1 min with +2 assist to aid with postural control and LE strengthening PT Short Term Goal 1 - Progress (Week 3): Met PT Short Term Goal 2 (Week 3): Pt will be able to complete sit to stand from elevated surface 2/3 trials with max A +2 PT Short Term Goal 2 - Progress (Week 3): Met PT Short Term Goal 3 (Week 3): Pt's wife will be able to demonstrate safe slide board transfer/set-up with min verbal cues to prepare for d/c home PT Short Term Goal 3 - Progress (Week 3): Met  Skilled Therapeutic Interventions/Progress Updates:   Supine to sit with mod@.  Multiple sliding board transfers (bed to w/c and w/c to mat) with overall mod@, pt did perform last transfer with supervision.  Used maxi sky and Harmon Pier walker for gait x 30' x 2 with total@+2, pt= 30%, pt significantly unweighted with maxi sky, bearing little weight through his LEs, in hip and knee flexion throughout the gait cycle, looking down.  Given multiple cues to correct posture, but seemed weak.  W/c propulsion with LEs and LUE x 30' with supervision, slow speed.  Pt and wife seemed encouraged with pt's progress.   Therapy Documentation Precautions:  Precautions Precautions: Fall Type of Shoulder Precautions: right shoulder subluxation  Precaution Comments:  ostomy, tachycardia, Afib, peg tube (doesn't wear abd binder anymore) Required Braces or Orthoses: Other Brace/Splint Other Brace/Splint: right wrist splint Restrictions Weight Bearing Restrictions: No Pain:  No pain  See FIM for current functional status  Therapy/Group: Individual Therapy  Waylan Boga 06/02/2012, 10:25 AM

## 2012-06-02 NOTE — Progress Notes (Signed)
Occupational Therapy Note  Patient Details  Name: Connor Small MRN: [id number] Date of Birth: 10-26-1960 Today's Date: 06/02/2012  Time:  1330-1430   (86mn) Pain:  None Individual session  Treatment focus on using BUE in cooking task, and sliding board transfers.  Pt. Used adaptive cutting board and knife to cut potatoes.  He used LUE primarily and right to apply pressure to knife.  Also used right to pick up vegetables and put in bowl.  Did sliding board transfer from wc to sofa with wife leading the transfer.  Pt/wife did with minimal assist from OT.     ELisa Roca5/10/2012, 2:29 PM

## 2012-06-02 NOTE — Progress Notes (Signed)
Patient ID: Connor Small, male   DOB: 1960/11/02, 52 y.o.   MRN: [id number]   Subjective/Complaints: 5/10.  No complaints today. States yesterday was his best day ever.  States that he experienced night sweats last night. Feels well this morning. 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 rate 17, height 5\' 10"  (1.778 m), weight 60.646 kg (133 lb 11.2 oz), SpO2 97.00%. No results found.  Recent Labs  05/31/12 0540 06/01/12 0615  WBC 12.8* 14.1*  HGB 8.4* 8.3*  HCT 25.7* 26.7*  PLT 516* 555*    Recent Labs  06/01/12 0615  NA 135  K 4.2  CL 99  GLUCOSE 99  BUN 26*  CREATININE 1.10  CALCIUM 9.8   CBG (last 3)  No results found for this basename: GLUCAP,  in the last 72 hours  Wt Readings from Last 3 Encounters:  06/02/12 60.646 kg (133 lb 11.2 oz)  05/07/12 66.2 kg (145 lb 15.1 oz)  04/18/12 71.94 kg (158 lb 9.6 oz)    Physical Exam:  Constitutional: He appears well-developed. He has a sickly appearance.   HENT:  Head: Normocephalic and atraumatic.  Eyes: Pupils are equal, round, and reactive to light.  Neck: Normal range of motion.  Cardiovascular: Regular rhythm. Tachycardia present. .  Pulmonary/Chest: Effort normal. No respiratory distress.  He has no wheezes. Decreased sounds at the bases. Abdominal: He exhibits minimal distension. Bowel sounds are increased. There is tenderness.  Midline incision with retention sutures in place--incision nearly  fully granulated in. Neurological: He is alert.  Basic insight and awareness. Was able to follow basic commands. Hoarse voice. 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 UE grossly  2to 3/5 LUE, 1-2/5 RUE with contracture in Hand (limited flexion). LE 1 to 2+/5 with weakness more in ankle dorsiflexion. DTR's 1+. Diffuse muscle wasting persistent. Good PROM in all 4's.  Skin: petechial rash along back, both ankles, left elbow. Areas slightly warmer Psychiatric: relaxed  Assessment/Plan:  1. 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 Physiatrist is providing close team supervision and 24 hour management of active medical problems listed below. Physiatrist and rehab team continue to assess barriers to discharge/monitor patient progress toward functional and medical goals.    FIM: FIM - Bathing Bathing Steps Patient Completed: Chest;Right Arm;Abdomen;Front perineal area;Right upper leg;Left upper leg Bathing: 3: Mod-Patient completes 5-7 65f 10 parts or 50-74% (supine for LB in w/c for upper body)  FIM - Upper Body Dressing/Undressing Upper body dressing/undressing steps patient completed: Thread/unthread right sleeve of pullover shirt/dresss;Thread/unthread left sleeve of pullover shirt/dress;Put head through opening of pull over shirt/dress;Pull shirt over trunk Upper body dressing/undressing: 5: Supervision: Safety issues/verbal cues FIM - Lower Body Dressing/Undressing Lower body dressing/undressing steps patient completed: Thread/unthread right pants leg;Thread/unthread left pants leg Lower body dressing/undressing: 2: Max-Patient completed 25-49% of tasks  FIM - Toileting Toileting: 0: Activity did not occur  FIM - Archivist Transfers: 0-Activity did not occur  FIM -  Bed/Chair Ambulance person Devices: Arm rests;Sliding board Bed/Chair Transfer: 4: Bed > Chair or W/C: Min A (steadying Pt. > 75%);4: Chair or W/C > Bed: Min A (steadying Pt. > 75%)  FIM - Locomotion: Wheelchair Locomotion: Wheelchair: 1: Travels less than 50 ft with supervision, cueing or coaxing FIM - Locomotion: Ambulation Locomotion: Ambulation Assistive Devices: MaxiSky;Fara Boros Ambulation/Gait Assistance: 1: +2 Total assist Locomotion: Ambulation: 1: Two helpers  Comprehension Comprehension Mode: Auditory Comprehension: 6-Follows complex conversation/direction: With extra time/assistive device  Expression Expression Mode: Verbal Expression: 5-Expresses complex 90% of the time/cues < 10% of the time  Social Interaction Social Interaction Mode: Not assessed Social Interaction: 5-Interacts appropriately 90% of the time - Needs monitoring or encouragement for participation or interaction.  Problem Solving Problem Solving Mode: Not assessed Problem Solving: 5-Solves basic 90% of the time/requires cueing < 10% of the time  Memory Memory Mode: Not assessed Memory: 4-Recognizes or recalls 75 - 89% of the time/requires cueing 10 - 24% of the time  Medical Problem List and Plan:  1. DVT Prophylaxis/Anticoagulation: Mechanical: Sequential compression devices, below knee Bilateral lower extremities thrombocytopenia resolved.   2. Multifactorial pain: tylenol, local measures, use ultram for more severe pain, HA 3. Mood: neuropsych consult requested. Needs encouragement  -klonopin changed to 0.5mg  qhs--this seems to work well 4. Neuropsych: This patient is not capable of making decisions on his/her own behalf.  5. FEN::   Serial labs, adjusting fluids, formula as well. Labs good today. Increase h2O boluses  -bolus feeds, with po attempts as well 6. Bilateral cardioembolic CVA: no blood thinners due to issues with bleeding 7. Anemia---likely ACD based on labwork.:  No further aranesp at  this time  -counts climbing slightly  -no signs of overt blood loss  -continue fes04 8. 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 9. Low grade temp/leukocytosis-   -peri-splenic and small bowel fluid collections,  pleural fluid collections  -WBC's 12-14k  -holding further abx pending aspirations of abdominal fluid- no organisms seen. cx pending   -appreciate pulmonary/ccs/ID/surgery assist  -pt denies feelling poorly  -awaiting final culture results on perisplenic tap--only 7cc tapped  -f/u CXR with increasing effusions---ccs to determine if/when they will re-tap 10. Petechial rash: some involvement of feet, hands. Denies pain/pruritrus with it  -likely drug reaction (?CT contrast, aspiration med)  -labwork remains stable  -follow clinically for now  LOS (Days) 24 A FACE TO FACE EVALUATION WAS PERFORMED  Rogelia Boga 06/02/2012 8:26 AM

## 2012-06-03 ENCOUNTER — Inpatient Hospital Stay (HOSPITAL_COMMUNITY): Payer: BC Managed Care – PPO

## 2012-06-03 NOTE — Progress Notes (Signed)
Patient ID: Connor Small, male   DOB: [birth year], 52 y.o.   MRN: [id number]  Subjective/Complaints:  49/46.  52 year old patient with a history of severe critical illness myopathy and neuropathy. 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 About the same. Maximum temperature 99.1. Remains tachycardic with pulse rates between 110 and 120. He continues to have low normal off and saturations. White blood cell count 2 days ago 14.1. Urine culture remains negative. Chest x-ray reveals a slight increase in his left pleural effusion  HEENT unremarkable oropharynx clear. Neck supple no adenopathy. No neck vein distention chest the clear anterolaterally. Cardiovascular rest and tachycardia otherwise negative abdomen benign. Extremities no edema. No calf Tenderness. SCDs in place as well as ankle braces  BP Readings from Last 3 Encounters:  06/03/12 97/63  05/09/12 106/73  04/25/12 112/64   Impression -severe critical illness myopathy and neuropathy  Bilateral pleural effusions left greater than right Persistent leukocytosis and tachycardia  Plan consider a followup chest x-ray and lab update in the morning. Continue CIR.   Objective: Vital Signs: Blood pressure 135/71, pulse 116, temperature 98.6 F (37 C), temperature source Oral, resp. rate 18, height 5\' 10"  (1.778 m), weight 73.1 kg (161 lb 2.5 oz), SpO2 96.00%. No results found.  Recent Labs  05/31/12 0540 06/01/12 0615  WBC 12.8* 14.3*  HGB 8.4* 8.4*  HCT 25.7* 26.9*  PLT 516* 534*    Recent Labs  06/01/12 0615  NA 135  K 4.2  CL 101  GLUCOSE 101*  BUN 26*  CREATININE 1.15  CALCIUM 9.1   CBG (last 3)  No results found for this basename: GLUCAP,  in the last 72 hours  Wt Readings from Last 3 Encounters:  06/01/12 73.1 kg (161 lb 2.5 oz)  05/07/12 66.2 kg (145 lb 15.1 oz)  04/18/12 71.94 kg (158 lb 9.6 oz)    Physical Exam:  Constitutional: He appears well-developed. He has a sickly appearance.    HENT:  Head: Normocephalic and atraumatic.  Eyes: Pupils are equal, round, and reactive to light.  Neck: Normal range of motion.  Cardiovascular: Regular rhythm. Tachycardia present. .  Pulmonary/Chest: Effort normal. No respiratory distress.  He has no wheezes. Decreased sounds at the bases. Abdominal: He exhibits minimal distension. Bowel sounds are increased. There is tenderness.  Midline incision with retention sutures in place--incision nearly fully granulated in. Neurological: He is alert.  Basic insight and awareness. Was able to follow basic commands. Hoarse voice. 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 UE grossly  2to 3/5 LUE, 1-2/5 RUE with contracture in Hand (limited flexion). LE 1 to 2+/5 with weakness more in ankle dorsiflexion. DTR's 1+. Diffuse muscle wasting persistent. Good PROM in all 4's.  Skin: petechial rash along back, both ankles, left elbow. Areas slightly warmer Psychiatric: relaxed  Assessment/Plan:  1. 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 Physiatrist is providing close team supervision and 24 hour management of active medical problems listed below. Physiatrist and rehab team continue to assess barriers to discharge/monitor patient progress toward functional and medical goals.    FIM: FIM - Bathing Bathing Steps Patient Completed: Chest;Right Arm;Abdomen;Front perineal area;Right upper leg;Left upper leg Bathing: 3: Mod-Patient completes 5-7 5f 10 parts or 50-74% (supine for LB in w/c for upper body)  FIM - Upper Body Dressing/Undressing Upper body dressing/undressing steps patient completed: Thread/unthread right sleeve  of pullover shirt/dresss;Thread/unthread left sleeve of pullover shirt/dress;Put head through opening of pull over shirt/dress Upper body dressing/undressing:  4: Min-Patient completed 75 plus % of tasks FIM - Lower Body Dressing/Undressing Lower body dressing/undressing steps patient completed: Thread/unthread right pants leg;Thread/unthread left pants leg;Don/Doff right shoe;Don/Doff left shoe Lower body dressing/undressing: 2: Max-Patient completed 25-49% of tasks  FIM - Toileting Toileting: 0: Activity did not occur  FIM - Archivist Transfers: 0-Activity did not occur  FIM - Banker Devices: Arm rests;Sliding board Bed/Chair Transfer: 4: Bed > Chair or W/C: Min A (steadying Pt. > 75%);4: Chair or W/C > Bed: Min A (steadying Pt. > 75%) (level surface on mat)  FIM - Locomotion: Wheelchair Locomotion: Wheelchair: 2: Travels 50 - 149 ft with supervision, cueing or coaxing FIM - Locomotion: Ambulation Ambulation/Gait Assistance: Not tested (comment) Locomotion: Ambulation: 0: Activity did not occur  Comprehension Comprehension Mode: Auditory Comprehension: 6-Follows complex conversation/direction: With extra time/assistive device  Expression Expression Mode: Verbal Expression: 5-Expresses complex 90% of the time/cues < 10% of the time  Social Interaction Social Interaction Mode: Not assessed Social Interaction: 5-Interacts appropriately 90% of the time - Needs monitoring or encouragement for participation or interaction.  Problem Solving Problem Solving Mode: Not assessed Problem Solving: 5-Solves basic 90% of the time/requires cueing < 10% of the time  Memory Memory Mode: Not assessed Memory: 4-Recognizes or recalls 75 - 89% of the time/requires cueing 10 - 24% of the time  Medical Problem List and Plan:  1. DVT Prophylaxis/Anticoagulation: Mechanical: Sequential compression devices, below knee Bilateral lower extremities thrombocytopenia resolved.   2. Multifactorial pain: tylenol, local measures, use ultram for more severe pain, HA 3. Mood: neuropsych consult requested.  Needs encouragement  -klonopin changed to 0.5mg  qhs--this seems to work well 4. Neuropsych: This patient is not capable of making decisions on his/her own behalf.  5. FEN::   Serial labs, adjusting fluids, formula as well. Labs good today. Increase h2O boluses  -bolus feeds, with po attempts as well 6. Bilateral cardioembolic CVA: no blood thinners due to issues with bleeding 7. Anemia---likely ACD based on labwork.:  No further aranesp at this time  -counts climbing slightly  -no signs of overt blood loss  -continue fes04 8. 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 9. Low grade temp/leukocytosis-   -peri-splenic and small bowel fluid collections,  pleural fluid collections  -WBC's 12-14k  -holding further abx pending aspirations of abdominal fluid- no organisms seen. cx pending   -appreciate pulmonary/ccs/ID/surgery assist  -pt denies feelling poorly  -awaiting final culture results on perisplenic tap--only 7cc tapped  -f/u CXR with increasing effusions---ccs to determine if/when they will re-tap 10. Petechial rash: some involvement of feet, hands. Denies pain/pruritrus with it  -likely drug reaction (?CT contrast, aspiration med)  -labwork remains stable  -follow clinically for now  LOS (Days) 23 A FACE TO FACE EVALUATION WAS PERFORMED  SWARTZ,ZACHARY T 06/01/2012 7:48 AM

## 2012-06-03 NOTE — Progress Notes (Signed)
Occupational Therapy Session Note  Patient Details  Name: Connor Small MRN: [id number] Date of Birth: 1960-10-24  Today's Date: 06/03/2012 Time: 1300-1400 Time Calculation (min): 60 min  Short Term Goals: Week 3:  OT Short Term Goal 1 (Week 3): Short Term Goals = Long Term Goals  Skilled Therapeutic Interventions: Therapeutic activities with emphasis on UE strengthening (deltoids and pectoralis), patient/family ed on use of supportive orthotics (cock-up splints to use during functional activities), and standing tolerance using standing frame.   Patient completed seated shoulder flexion, seated shoulder horizontal ab/adduction, and elbow flexion/extension with resistance, 10 reps of 1 set, 3 exercises, on table top with OT provided resistance to movement and 2 rest breaks.   Re-educated pt and spouse on alternate HEP (written literature provided) using theraband.   Patient also tolerated approx 5 minutes of static standing using standing frame at end of treatment session.   Therapy Documentation Precautions:  Precautions Precautions: Fall Type of Shoulder Precautions: right shoulder subluxation  Precaution Comments:  ostomy, tachycardia, Afib, peg tube (doesn't wear abd binder anymore) Required Braces or Orthoses: Other Brace/Splint Other Brace/Splint: right wrist splint Restrictions Weight Bearing Restrictions: No  Pain: Pain Assessment Pain Assessment: No/denies pain  See FIM for current functional status  Therapy/Group: Individual Therapy  Maryan Puls 06/03/2012, 3:37 PM

## 2012-06-04 ENCOUNTER — Inpatient Hospital Stay (HOSPITAL_COMMUNITY): Payer: BC Managed Care – PPO | Admitting: *Deleted

## 2012-06-04 ENCOUNTER — Inpatient Hospital Stay (HOSPITAL_COMMUNITY): Payer: BC Managed Care – PPO

## 2012-06-04 ENCOUNTER — Inpatient Hospital Stay (HOSPITAL_COMMUNITY): Payer: BC Managed Care – PPO | Admitting: Occupational Therapy

## 2012-06-04 DIAGNOSIS — D638 Anemia in other chronic diseases classified elsewhere: Secondary | ICD-10-CM

## 2012-06-04 DIAGNOSIS — D62 Acute posthemorrhagic anemia: Secondary | ICD-10-CM

## 2012-06-04 DIAGNOSIS — G8250 Quadriplegia, unspecified: Secondary | ICD-10-CM

## 2012-06-04 DIAGNOSIS — N179 Acute kidney failure, unspecified: Secondary | ICD-10-CM

## 2012-06-04 DIAGNOSIS — G7281 Critical illness myopathy: Secondary | ICD-10-CM

## 2012-06-04 DIAGNOSIS — J90 Pleural effusion, not elsewhere classified: Secondary | ICD-10-CM

## 2012-06-04 LAB — CBC
HCT: 25.8 % — ABNORMAL LOW (ref 39.0–52.0)
Hemoglobin: 8.1 g/dL — ABNORMAL LOW (ref 13.0–17.0)
MCH: 28.7 pg (ref 26.0–34.0)
MCHC: 31.4 g/dL (ref 30.0–36.0)

## 2012-06-04 LAB — BASIC METABOLIC PANEL
BUN: 28 mg/dL — ABNORMAL HIGH (ref 6–23)
Chloride: 103 mEq/L (ref 96–112)
Glucose, Bld: 98 mg/dL (ref 70–99)
Potassium: 3.9 mEq/L (ref 3.5–5.1)

## 2012-06-04 NOTE — Progress Notes (Signed)
PULMONARY  / CRITICAL CARE MEDICINE  Name: Connor Small MRN: [id number] DOB: [birth year]    ADMISSION DATE:  05/09/2012 CONSULTATION DATE:  4/30  REFERRING MD :  Riley Kill PRIMARY SERVICE:  rehab  CHIEF COMPLAINT:  Left effusion   BRIEF PATIENT DESCRIPTION:  1 YOM admitted to [facility] for Cdiff on 2/2. Underwent decompressive cecostomy 2/3, Hospital course complicated by ARDS, PNA, septic shock, bilateral cardioembolic CVAs, renal failure, wound dehiscence and required multiple OR visits. To  rehab, aspiration event 3/29-->treated, back to rehab. PCCM asked once again to see on 4/30 for on going low gd temp,  left effusion (which has been chronic since feb 2014, but has gotten a little worse).    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 Overlying bowel gas and patientclinical status. No explanation for elevated liver function tests.Similar to slight increase in size of a perisplenic fluidcollection.4. Right pleural effusion  3/28 - Called back to see on rehab for ? Asp  4/30 - L thora with 1750 orange tinged fluid removed, fluid c/w exudative process, no malignant cells, neg culture  LINES / TUBES:  CULTURES: UC 4/22 >> insig growth  Pleural Fluid 4/30 >>>no yeast / fungal, neg culture  ANTIBIOTICS: levaquin 4/22>>>5/4  SUBJECTIVE: Pt was able to go to [employer] and wheel himself around in the wheel chair with his brother. No fever and no acute changes.  VITAL SIGNS: Temp:  [98.5 F (36.9 C)-99.2 F (37.3 C)] 98.5 F (36.9 C) (05/12 0546) Pulse  Rate:  [112] 112 (05/12 0546) Resp:  [20] 20 (05/12 0546) BP: (97)/(60) 97/60 mmHg (05/12 0546) SpO2:  [98 %] 98 % (05/12 0546) Weight:  [139 lb 11.2 oz (63.368 kg)] 139 lb 11.2 oz (63.368 kg) (05/12 0546) Room air   PHYSICAL EXAMINATION: General:  Awake, oriented, chronically ill appearing but much stronger / improving Neuro:  Oriented. Generalized weakness w/ RUE weakness. Some short term mem def  HEENT:  [place], no JVD Cardiovascular: tachy  Lungs:  Decreased left lower. No crackles,rales, or rhonchi Abdomen:  Mid abd dressing intact, ostomy draining. PEG unremark Musculoskeletal:  Generalized weakness but improved, no sig swelling    Recent Labs Lab 05/29/12 0610 06/01/12 0615 06/04/12 0500  NA 134* 135 137  K 4.1 4.2 3.9  CL 98 99 103  CO2 24 21 25   BUN 20 26* 28*  CREATININE 1.00 1.10 0.87  GLUCOSE 91 99 98    Recent Labs Lab 05/31/12 0540 06/01/12 0615 06/04/12 0500  HGB 8.4* 8.3* 8.1*  HCT 25.7* 26.7* 25.8*  WBC 12.8* 14.1* 10.9*  PLT 516* 555* 478*   Dg Chest Port 1 View  06/04/2012  *RADIOLOGY REPORT*  Clinical Data: Follow up of pleural effusion.  Chronic diarrhea. Ulcerative colitis.  PORTABLE CHEST - 1 VIEW  Comparison: 05/30/2012  Findings: Normal heart size.  The left-sided pleural effusion is slightly decreased in size.  There may be minimal loculation laterally.  The right-sided pleural effusion is resolved. No pneumothorax.  Resolved right and improved left base  air space disease.  IMPRESSION: Improved aeration, with decreased left-sided pleural effusion. This may have minimal loculation laterally, similar.  Improved left base airspace disease, likely atelectasis.  Resolved right-sided pleural effusion and airspace disease.   Original Report Authenticated By: Jeronimo Greaves, M.D.    ASSESSMENT / PLAN: Left pleural effusion. Chronic since feb post PNA.  Repeat PNA in April with associated effusion s/p thora 4/30. Was exudate in past and again on 4/30.  Negative  culture, neg fungal, & cytology.   Clinically progressing Plan: -completed abx 5/4 (levaquin) -fever improved -recommend outpatient pulmonary follow up post discharge. And a pcxr at that visit -aggressive pulmonary hygiene -nutritional support -Maximize IS -cough from atx likely -when ambulates, check pulse oximetry -neg balance likely beneficial as tolerated  Fever-resolved;Unclear etiology.  Suspect abd fluid pockets are source but large L effusion with compressive atx may be contributing as well.   Plan: -monitor fever curve  Gery Pray, PA-S  I have fully examined this patient and agree with above findings.    And edited in full  Will sign off call as needed  Mcarthur Rossetti. Tyson Alias, MD, FACP Pgr: [phone] [place] [facility]

## 2012-06-04 NOTE — Progress Notes (Signed)
Occupational Therapy Session Note  Patient Details  Name: Connor Small MRN: [id number] Date of Birth: [birth year]  Today's Date: 06/04/2012 Time: 1448-1856 Time Calculation (min): 40 min  Short Term Goals: Week 1:  OT Short Term Goal 1 (Week 1): Self Feeding:  Patient will feed himself at least 50% of 2 meals per day during this recording period. OT Short Term Goal 1 - Progress (Week 1): Progressing toward goal OT Short Term Goal 2 (Week 1): Grooming:  Patient will perform 2 grooming tasks with min assist to include set up once items needed are provided OT Short Term Goal 2 - Progress (Week 1): Progressing toward goal OT Short Term Goal 3 (Week 1): UB Dressing:  Patient will don shirt in supported sit with max assist OT Short Term Goal 3 - Progress (Week 1): Met OT Short Term Goal 4 (Week 1): Sitting balance:  Patient will sit without back support with mod assist while completing simple BADL task at least 2 days this recording period. OT Short Term Goal 4 - Progress (Week 1): Met OT Short Term Goal 5 (Week 1): UE exercises:  Patient will tolerate BUE exercises at least 4 times during this recording period. OT Short Term Goal 5 - Progress (Week 1): Met  Week 2:  OT Short Term Goal 1 (Week 2): Patient will perform grooming tasks with minimal assistance OT Short Term Goal 1 - Progress (Week 2): Met OT Short Term Goal 2 (Week 2): Patient will perform LB dressing with moderate assistance (pants only) OT Short Term Goal 2 - Progress (Week 2): Met OT Short Term Goal 3 (Week 2): Patient will be able to direct care with slide board transfer with min verbal cues OT Short Term Goal 3 - Progress (Week 2): Met OT Short Term Goal 4 (Week 2): Patient will be able to direct UE exercises with min verbal cues OT Short Term Goal 4 - Progress (Week 2): Met  Week 3:  OT Short Term Goal 1 (Week 3): Short Term Goals = Long Term Goals  Skilled Therapeutic Interventions/Progress Updates:  Patient found supine  in bed with no complaints of pain. Patient engaged in bed mobility with supervision (HOB elevated and use of bed rails). Patient then sat edge of bed for UB/LB dressing; UB dressing with supervision and patient able to thread BLEs into pants using reacher. Therapist donned patient's bilateral shoes. From there patient stood with [employer] mechanical lift in order to pull pants up to waist. While patient continued to stand, therapist transferred patient into w/c using lift. Once positioned in w/c patient sat at sink for grooming tasks. Therapist administered red foam to build up toothbrush to help increase patient's RUE independence with grooming tasks. Encouraged patient to use red foam to build up utensils as well. Therapist also encouraged patient to utilize Rhand during functional tasks such as grooming tasks and self-feeding tasks. At end of session, left patient seated in w/c with daughter present in room. Patient in good spirits and excited about community outing this afternoon.   Precautions:  Precautions Precautions: Fall Type of Shoulder Precautions: right shoulder subluxation  Precaution Comments:  ostomy, tachycardia, Afib, peg tube (doesn't wear abd binder anymore) Required Braces or Orthoses: Other Brace/Splint Other Brace/Splint: right wrist splint Restrictions Weight Bearing Restrictions: No  See FIM for current functional status  Therapy/Group: Individual Therapy  Connor Small 06/04/2012, 10:36 AM

## 2012-06-04 NOTE — Progress Notes (Signed)
Recreational Therapy Session Note  Patient Details  Name: Connor Small MRN: [id number] Date of Birth: 1960/11/22 Today's Date: 06/04/2012 Time:   1330-1500 Pain: no c/o Skilled Therapeutic Interventions/Progress Updates: Pt participated in community reintegration/outing to [place] w/c level with focus on w/c mobility on level community surfaces using BLE's & RUE, problem solving obstacles including accessing public restrooms, RUE use to retrieve items from shelves.  Pt at [place] assist level overall.  Pt's daughter & brother attending, educated and participatory in discussions.  Pt expressed that he really enjoyed being out of the hospital, stressing that on Wednesday, he would be in hospital 101 days.  Therapy/Group: [employer]  Tejas Seawood 06/04/2012, 4:07 PM

## 2012-06-04 NOTE — Progress Notes (Signed)
Physical Therapy Session Note  Patient Details  Name: BALDWIN RACICOT MRN: [id number] Date of Birth: May 19, 1960  Today's Date: 06/04/2012 Time: 1330-1500 Time Calculation (min): 90 min  Short Term Goals: Week 4:  PT Short Term Goal 1 (Week 4): = LTGs  Skilled Therapeutic Interventions/Progress Updates:    Pt participated in community reintegration hospital outing to [employer] with focus on w/c propulsion in community environment, social interaction/emotional well-being, safety, problem solving community scenarios, functional use of RUE and RLE and overall activity tolerance. Excellent participation overall and family present for education as appropriate (brother and daughter).  Therapy Documentation Precautions:  Precautions Precautions: Fall Type of Shoulder Precautions: right shoulder subluxation  Precaution Comments:  ostomy, tachycardia, Afib, peg tube (doesn't wear abd binder anymore) Required Braces or Orthoses: Other Brace/Splint Other Brace/Splint: right wrist splint Restrictions Weight Bearing Restrictions: No    Pain:  No complaints of pain.   See FIM for current functional status  Therapy/Group: Community Reintegration  [name] [facility] 06/04/2012, 3:50 PM

## 2012-06-04 NOTE — Progress Notes (Signed)
Subjective/Complaints: No new issues. Had a good day yesterday. Excited to go home. A 12 point review of systems has been performed and if not noted above is otherwise negative.   Objective: Vital Signs: Blood pressure 97/60, pulse 112, temperature 98.5 F (36.9 C), temperature source Oral, resp. rate 20, height 5' 10"  (1.778 m), weight 63.368 kg (139 lb 11.2 oz), SpO2 98.00%. No results found.  Recent Labs  06/04/12 0500  WBC 10.9*  HGB 8.1*  HCT 25.8*  PLT 478*    Recent Labs  06/04/12 0500  NA 137  K 3.9  CL 103  GLUCOSE 98  BUN 28*  CREATININE 0.87  CALCIUM 9.4   CBG (last 3)  No results found for this basename: GLUCAP,  in the last 72 hours  Wt Readings from Last 3 Encounters:  06/04/12 63.368 kg (139 lb 11.2 oz)  05/07/12 66.2 kg (145 lb 15.1 oz)  04/18/12 71.94 kg (158 lb 9.6 oz)    Physical Exam:  Constitutional: He appears well-developed. He has a sickly appearance.   HENT:  Head: Normocephalic and atraumatic.  Eyes: Pupils are equal, round, and reactive to light.  Neck: Normal range of motion.  Cardiovascular: Regular rhythm. Tachycardia present. .  Pulmonary/Chest: Effort normal. No respiratory distress.  He has no wheezes. Decreased sounds at the bases. Abdominal: He exhibits minimal distension. Bowel sounds are increased. There is tenderness.  Midline incision with retention sutures in place--incision nearly fully granulated in. Neurological: He is alert.  Basic insight and awareness. Was able to follow basic commands. Hoarse voice. 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 UE grossly  2to 3/5 LUE, 1-2/5 RUE with contracture in Hand (limited flexion). LE 1 to 2+/5 with weakness more in ankle dorsiflexion. DTR's 1+. Diffuse muscle wasting persistent. Good PROM in all 4's.  Skin: petechial rash along back, both ankles, left elbow with maturation. Psychiatric: relaxed  Assessment/Plan:  1. 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 Physiatrist is providing close team supervision and 24 hour management of active medical problems listed below. Physiatrist and rehab team continue to assess barriers to discharge/monitor patient progress toward functional and medical goals.    FIM: FIM - Bathing Bathing Steps Patient Completed: Chest;Right Arm;Abdomen;Front perineal area;Right upper leg;Left upper leg Bathing: 3: Mod-Patient completes 5-7 [id number] parts or 50-74% (supine for LB in w/c for upper body)  FIM - Upper Body Dressing/Undressing Upper body dressing/undressing steps patient completed: Thread/unthread right sleeve of pullover shirt/dresss;Thread/unthread left sleeve of pullover shirt/dress;Put head through opening of pull over shirt/dress;Pull shirt over trunk Upper body dressing/undressing: 5: Supervision: Safety issues/verbal cues FIM - Lower Body Dressing/Undressing Lower body dressing/undressing steps patient completed: Thread/unthread right pants leg;Thread/unthread left pants leg Lower body dressing/undressing: 2: Max-Patient completed 25-49% of tasks  FIM - Toileting Toileting: 0: Activity did not occur  FIM - TAir cabin crewTransfers: 0-Activity did not occur  FIM - BControl and instrumentation engineerDevices: Sliding board Bed/Chair Transfer: 3: Supine > Sit: Mod A (lifting assist/Pt. 50-74%/lift 2 legs;3: Bed > Chair or W/C: Mod A (lift or lower assist);3: Chair or W/C > Bed: Mod A (lift or lower assist)  FIM - Locomotion: Wheelchair Distance: 30 Locomotion: Wheelchair: 1: Travels less than 50 ft with supervision, cueing or coaxing FIM - Locomotion: Ambulation Locomotion: AChiropodistDevices: MaxiSky;WEthelene HalAmbulation/Gait Assistance: 1: +2 Total assist Locomotion: Ambulation:  1: Two  helpers  Comprehension Comprehension Mode: Auditory Comprehension: 6-Follows complex conversation/direction: With extra time/assistive device  Expression Expression Mode: Verbal Expression: 5-Expresses complex 90% of the time/cues < 10% of the time  Social Interaction Social Interaction Mode: Not assessed Social Interaction: 5-Interacts appropriately 90% of the time - Needs monitoring or encouragement for participation or interaction.  Problem Solving Problem Solving Mode: Not assessed Problem Solving: 5-Solves basic 90% of the time/requires cueing < 10% of the time  Memory Memory Mode: Not assessed Memory: 4-Recognizes or recalls 75 - 89% of the time/requires cueing 10 - 24% of the time  Medical Problem List and Plan:  1. DVT Prophylaxis/Anticoagulation: Mechanical: Sequential compression devices, below knee Bilateral lower extremities thrombocytopenia resolved.   2. Multifactorial pain: tylenol, local measures, use ultram for more severe pain, HA 3. Mood: neuropsych consult requested. Needs encouragement  -klonopin changed to 0.57m qhs--this seems to work well 4. Neuropsych: This patient is not capable of making decisions on his/her own behalf.  5. FEN::   Serial labs, adjusting fluids, formula as well. Labs good today. Increase h2O boluses  -bolus feeds, with po attempts as well 6. Bilateral cardioembolic CVA: no blood thinners due to issues with bleeding 7. Anemia---likely ACD based on labwork.:  No further aranesp at this time  -counts holding around 8  -no signs of overt blood loss  -continue fes04 8. 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 9. Low grade temp/leukocytosis-   -peri-splenic and small bowel fluid collections,  pleural fluid collections  -WBC's 10K  -holding further abx pending aspirations of abdominal fluid- no organisms seen. cx  pending   -appreciate pulmonary/ccs/ID/surgery assist  -pt denies feelling poorly  -peritoneal fluid culture negative  -f/u CXR this am  -need a game plan for follow up as he has persistent low grade temp 10. Petechial rash: some involvement of feet, hands. Denies pain/pruritrus with it  -likely drug reaction (?CT contrast, aspiration med)  -labwork remains stable  -follow clinically for now  LOS (Days) 26 A FACE TO FACE EVALUATION WAS PERFORMED  Takeem Krotzer T 06/04/2012 7:47 AM

## 2012-06-04 NOTE — Progress Notes (Signed)
INFECTIOUS DISEASE PROGRESS NOTE  ID: Connor Small is a 52 y.o. 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   Coagulopathy   Acute blood loss anemia   Ileostomy in place   Ascites s/p US guide paracentesis (4/2)  Subjective: Pt not in room  Abtx:  Anti-infectives   Start     Dose/Rate Route Frequency Ordered Stop   05/26/12 0800  fluconazole (DIFLUCAN) 40 MG/ML suspension 100 mg  Status:  Discontinued     100 mg Oral Daily 05/25/12 0655 05/25/12 0656   05/25/12 0700  fluconazole (DIFLUCAN) 40 MG/ML suspension 200 mg  Status:  Discontinued     200 mg Oral  Once 05/25/12 0655 05/25/12 0656   05/16/12 1800  levofloxacin (LEVAQUIN) tablet 750 mg  Status:  Discontinued     750 mg Oral Daily-1800 05/16/12 1551 05/27/12 0655   05/15/12 0830  ciprofloxacin (CIPRO) tablet 500 mg  Status:  Discontinued    Comments:  Begin after urine sample is collected   500 mg Oral 2 times daily 05/15/12 0740 05/16/12 1551      Medications:  Scheduled: . aspirin  81 mg Per Tube Daily  . clonazePAM  0.5 mg Oral QHS  . feeding supplement  30 mL Per Tube TID WC  . feeding supplement (VITAL 1.5 CAL)  237 mL Per Tube Custom  . ferrous sulfate  300 mg Per Tube BID WC  . free water  150 mL Per Tube TID WC & HS  . lidocaine  1 patch Transdermal Q24H  . lip balm   Topical BID  . metoprolol tartrate  6.25 mg Per Tube Daily  . multivitamin  5 mL Per Tube Daily  . pantoprazole sodium  40 mg Per Tube Q1200    Objective: Vital signs in last 24 hours: Temp:  [98.3 F (36.8 C)-99.2 F (37.3 C)] 98.5 F (36.9 C) (05/12 0546) Pulse Rate:  [112-134] 112 (05/12 0546) Resp:  [20] 20 (05/12 0546) BP: (97-98)/(60) 97/60 mmHg (05/12 0546) SpO2:  [98 %] 98 % (05/12 0546) Weight:  [63.368 kg (139 lb 11.2 oz)] 63.368 kg (139 lb 11.2 oz) (05/12  0546)   not in room  Lab Results  Recent Labs  06/04/12 0500  WBC 10.9*  HGB 8.1*  HCT 25.8*  NA 137  K 3.9  CL 103  CO2 25  BUN 28*  CREATININE 0.87   Liver Panel No results found for this basename: PROT, ALBUMIN, AST, ALT, ALKPHOS, BILITOT, BILIDIR, IBILI,  in the last 72 hours Sedimentation Rate No results found for this basename: ESRSEDRATE,  in the last 72 hours C-Reactive Protein No results found for this basename: CRP,  in the last 72 hours  Microbiology: Recent Results (from the past 240 hour(s))  BODY FLUID CULTURE     Status: None   Collection Time    05/29/12  3:21 PM      Result Value Range Status   Specimen Description PERITONEAL FLUID   Final   Special Requests FROM PERITONEAL CAVITY   Final   Gram Stain     Final   Value: RARE WBC PRESENT, PREDOMINANTLY MONONUCLEAR     NO ORGANISMS SEEN   Culture NO GROWTH 3 DAYS   Final   Report Status 06/02/2012 FINAL   Final  URINE CULTURE  Status: None   Collection Time    05/31/12  4:05 PM      Result Value Range Status   Specimen Description URINE, CLEAN CATCH   Final   Special Requests NONE   Final   Culture  Setup Time 05/31/2012 16:49   Final   Colony Count NO GROWTH   Final   Culture NO GROWTH   Final   Report Status 06/02/2012 FINAL   Final    Studies/Results: Dg Chest Port 1 View  06/04/2012  *RADIOLOGY REPORT*  Clinical Data: Follow up of pleural effusion.  Chronic diarrhea. Ulcerative colitis.  PORTABLE CHEST - 1 VIEW  Comparison: 05/30/2012  Findings: Normal heart size.  The left-sided pleural effusion is slightly decreased in size.  There may be minimal loculation laterally.  The right-sided pleural effusion is resolved. No pneumothorax.  Resolved right and improved left base air space disease.  IMPRESSION: Improved aeration, with decreased left-sided pleural effusion. This may have minimal loculation laterally, similar.  Improved left base airspace disease, likely atelectasis.  Resolved  right-sided pleural effusion and airspace disease.   Original Report Authenticated By: Abigail Miyamoto, M.D.      Assessment/Plan: Leukocytosis and fever (resolved) abd fluid collection Rash  Off anbx since 05-26-12 (he is not in his room or in the Gym) Would continue to observe him off anbx Could consider derm eval for f/u of his rash if not improving.   Bobby Rumpf Infectious Diseases [phone] 06/04/2012, 11:24 AM   LOS: 26 days

## 2012-06-05 ENCOUNTER — Inpatient Hospital Stay (HOSPITAL_COMMUNITY): Payer: BC Managed Care – PPO | Admitting: Occupational Therapy

## 2012-06-05 ENCOUNTER — Inpatient Hospital Stay (HOSPITAL_COMMUNITY): Payer: BC Managed Care – PPO | Admitting: *Deleted

## 2012-06-05 LAB — CBC
HCT: 26.1 % — ABNORMAL LOW (ref 39.0–52.0)
MCH: 28.9 pg (ref 26.0–34.0)
MCV: 90.2 fL (ref 78.0–100.0)
MCV: 90.9 fL (ref 78.0–100.0)
Platelets: 458 10*3/uL — ABNORMAL HIGH (ref 150–400)
RDW: 17.6 % — ABNORMAL HIGH (ref 11.5–15.5)
RDW: 17.7 % — ABNORMAL HIGH (ref 11.5–15.5)
WBC: 11 10*3/uL — ABNORMAL HIGH (ref 4.0–10.5)

## 2012-06-05 LAB — MISCELLANEOUS TEST

## 2012-06-05 NOTE — Patient Care Conference (Signed)
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 Aspiration pneumonitis 04/26/2012  . Clostridium difficile colitis 04/26/2012  . Intra-abdominal fluid collection/LUQ 04/26/2012  . CKD (chronic kidney disease) stage 2, GFR 60-89 ml/min 04/26/2012  . Acute renal failure 04/26/2012  . Hypokalemia 04/26/2012  . Protein-calorie malnutrition, severe 04/25/2012  . Spleen hematoma - subcapsular & without rupture  04/25/2012  . Ascites s/p US guide paracentesis (4/2) 04/25/2012  . Ileostomy in place 04/24/2012  . HAP (hospital-acquired pneumonia) 04/21/2012  . 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 Acute blood loss anemia 04/18/2012  . S/p total colectomy for toxic megacolon, C diff 03/23/2012  . Hypernatremia, improving 03/23/2012  . Severe muscle deconditioning 03/23/2012  . Shoulder subluxation, right 03/23/2012  . 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 Coagulopathy 03/23/2012  . Quadriparesis 03/17/2012  . Leukocytosis, unspecified 03/15/2012  . Hyperbilirubinemia, due to cholestasis 03/15/2012  . PAF (paroxysmal atrial fibrillation) 03/15/2012  . Anemia of chronic disease 03/15/2012  . Pleural effusion, L > R 03/04/2012  . Sinus tachycardia 02/28/2012  . Hematuria 02/28/2012  . history of UC (ulcerative colitis) 12/06/2010    Expected Discharge Date: Expected Discharge Date:  06/06/12  Team Members Present: Physician leading conference: Dr. Faith Rogue Social Worker Present: Amada Jupiter, LCSW Nurse Present: Rosalio Macadamia, RN PT Present: Karolee Stamps, PT;Other (comment) Sherrine Maples, PT) OT Present: Mackie Pai, OT;Patricia Mat Carne, OT Other (Discipline and Name): Tora Duck, PPS Coordinator     Current Status/Progress Goal Weekly Team Focus  Medical   temp and fluid collections improved. wounds healing  finalize medical plan for dc  family ed, pain mgt   Bowel/Bladder   continent of bladder/ileostomy - wife empties ileostomy, and helps with positioning and emptying of urinal  remain continent of bladder with wife assist  remain continent of bladder; educate wife as needed    Swallow/Nutrition/ Hydration             ADL's   supervision UB dressing, mod assist LB dressing, min-mod for slide board transfers, supervision for grooming & self -feeding tasks  overall mod assist; supervision for grooming tasks, self-feeding tasks, and dynamic sitting balance  family education, functional use of RUE/hand, ROM -> RUE, tub/shower transfer   Mobility   min to mod A for slide board transfers, Maxi sky for gait with Fara Boros with +2 for sit to stand from elevated surface; S w/c propulsion  mod A basic transfers w/c level; S w/c propulsion in controlled environment and min A in home environment  Fam Ed and finalize d/c planning   Communication             Safety/Cognition/ Behavioral Observations            Pain   occasional c/o of aching of R shoulder/generalized; relieved by PRN tramadol   pain controlled with meds; pain level  less than 3  assess pain and administer PRN meds as needed   Skin   ABD wound healed with sutures, dry dressing; Stage II PU healed, allevyn dsg for prevention; petechiae on legs and arms   Wound, ulcers, incision healed; no new infections/breakdown  assess skin, turn q2h, maintain air mattress    Rehab Goals Patient on target to meet  rehab goals: Yes *See Care Plan and progress notes for long and short-term goals.  Barriers to Discharge: see prior    Possible Resolutions to Barriers:  see prior    Discharge Planning/Teaching Needs:  Home with wife and daughter to provide 24/7 assistance.      Team Discussion:  Pt and family have completed all family training and met targeted goals.  Ready for d/c tomorrow.  Revisions to Treatment Plan:  None   Continued Need for Acute Rehabilitation Level of Care: The patient requires daily medical management by a physician with specialized training in physical medicine and rehabilitation for the following conditions: Daily direction of a multidisciplinary physical rehabilitation program to ensure safe treatment while eliciting the highest outcome that is of practical value to the patient.: Yes Daily medical management of patient stability for increased activity during participation in an intensive rehabilitation regime.: Yes Daily analysis of laboratory values and/or radiology reports with any subsequent need for medication adjustment of medical intervention for : Neurological problems;Pulmonary problems  Stonewall Doss 06/05/2012, 4:14 PM

## 2012-06-05 NOTE — Progress Notes (Signed)
NUTRITION FOLLOW UP  DOCUMENTATION CODES  Per approved criteria   -Severe malnutrition in the context of acute illness or injury    Intervention:    Continue the following enteral nutrition regimen:  Continue 237 ml bolus feedings of Vital 1.5 QID. Pt to receive bolus only if oral intake is <50% of meals. Pt should always receive HS bolus. Total bolus regimen (4 x 237 ml daily) will provide: 1420 kcal, 64 grams protein, 724 ml free water.  Continue 30 ml Prostat via tube TID with meals. This provides an additional 300 kcal and 45 grams protein. RD to continue to follow nutrition care plan.   RD provided several handouts regarding High-Calorie, High-Protein Nutrition Therapy; Ileostomy Nutrition Therapy; etc  Nutrition Dx:   Inadequate oral intake related to poor appetite, early satiety, and lethargy as evidenced by poor meal completion. Resolved.  New Nutrition Dx: Increased nutrient needs r/t participation in rehab therapies AEB estimated needs.  Goal:   Intake to meet >90% of estimated nutrition needs. Met with enteral nutrition and oral intake.  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 Pt with acute respiratory and renal failure with ARDS and septic shock. Pt recently discharged to CIR, however currently admitted as inpatient due to possible aspiration PNA (4/2).   Hospital course nutrition summary:  2/2 - 2/20: permissively underfed with EN  2/21-2/26: advanced to full feeds  2/26 - 3/3: feeds held for surgery  3/4 - 3/11: resumed full feeds  3/11 - 3/12: feeds held for GI bleed  3/13 - 3/24: received full feeds  3/24: TFs discontinued diet initiated; pt with very poor oral intake; tx to rehab 3/25  3/28 -  4/1: increased n/v - pt refusing almost all meals and supplements; wife declined calorie count, requesting TPN  4/2: NGT placed to suction, surgery ordering TF initiation; tx back to acute with plans for trickle feedings.  4/3-4/4: Trickle feeds of Vital 1.2 @ 30 mL/hr  4/5: PEG placement, resume of Vital 1.2  4/6-4/9: Inconsistent TF infusion and frequent adjustment of rate. Pt has not yet reached >40 mL/hr with tolerance. Wt now 151 lbs.  4/10-11: Pt has advanced as scheduled to 60 mL/hr. Discussed RN. Plan to increase to 70 mL this afternoon.  4/14: Pt has reached goal TF rate of 75 mL/hr with tolerance. PO intake remains insufficient with improvement per pt's report.  4/15: Pt ready to begin transition to cyclic feeds. RD to order Vital 1.2 @ 85 mL/hr x21 hrs daily. Will continue with daily advancements as tolerated and shorter duration of tubefeeds.  4/16: Pt agreeable to change formula to Vital 1.5 @ 80 mL/hr x 18 hrs daily.  4/17: Transfer to CIR. Continues with order of Vital 1.5 @ 80 ml/hr x 18 hrs daily. 4/21: Initiation of 3-day calorie count; continue Vital 1.5 @ 80 ml/hr x 18 hrs daily. 4/22: Day 1 Calorie Count Results -824 kcal (37% of minimum estimated needs) and 13.5 protein (11% of minimum estimated needs) 4/25:  Pt continues cyclic regimen.  Calorie count complete.  Wife and patient informed of results. ecreased cyclic feeds overnight and start transition to bolus feeds. Changes in regimen based on calorie count results are listed in interventions above. 4/25: Pt tolerated 80  mL bolus Vital 1.5 administered over 30 minutes at 6p.  Resume Vital 1.5 @ 80 mL/hr x 15 hrs from 7p-10a. 4/27 - 4/28: Pt with low grade fevers and n/v. Did not attempt boluses over weekend per wife. 4/29: Received 2 x 80 ml boluses at [employer]; Breakfast bolus skipped 2/2 x-ray. 4/30: Received 2 x 160 ml boluses at [employer]; Breakfast bolus skipped 2/2 thoracentesis. 5/1: Received 3 x 237 ml  boluses at [facility], Lunch, and [employer] 5/3: Nocturnal feedings discontinued. 5/4: Pt eating well enough to the point where he did not need boluses. Nocturnal feedings discontinued. HS bolus added, per wife's request. 5/6: Tolerating boluses well, one 30 ml Prostat daily tolerated well. 5/7: Wife agreeable to providing boluses without pump - discussed with RN.  5/8: Tolerating 30 ml Prostat via tube TID with meals. Not requiring many boluses with meals.  Planning to d/c home tomorrow with wife. Most recent temperature is 98.2 degrees F.   Pt receiving 237 mL bolus of Vital 1.5 administered over 30 minutes at 0800, 1300, 1800, 2130. Pt to receive bolus if meal intake <50%. Pt has not required bolus at meal times in approximately 1 week - eating has improved dramatically during rehab stay. Pt only received 120 ml of HS bolus last evening. Pt is excited to go home.  Receiving 150 ml free water QID - provides an additional 600 ml free water daily.  Reviewed calorie and protein recommendations with wife. Discussed high protein foods and where to purchase Prostat liquid protein supplement.  Height: Ht Readings from Last 1 Encounters:  05/09/12 5' 10"  (1.778 m)    Weight Status:   Wt Readings from Last 1 Encounters:  06/05/12 138 lb 13.5 oz (62.98 kg)  Wt variable  Re-estimated needs:  Kcal: 2000 - 2400 Protein: 95 - 120 grams Fluid: > 2 liters  Skin: abdominal incision - healed well per RN; ileostomy; stage II pressure ulcer to R buttocks  Diet Order: General   Intake/Output Summary (Last 24 hours) at 06/05/12 1145 Last data filed at 06/05/12 0000  Gross per 24 hour  Intake    720 ml  Output    500 ml  Net    220 ml    Last BM: 5/11 (300 ml output yesterday via ostomy yesterday)  Labs:   Recent Labs Lab 06/01/12 0615 06/04/12 0500  NA 135 137  K 4.2 3.9  CL 99 103  CO2 21 25  BUN 26* 28*  CREATININE 1.10 0.87  CALCIUM 9.8 9.4  GLUCOSE 99 98    CBG (last 3)   No results found for this basename: GLUCAP,  in the last 72 hours  Scheduled Meds: . aspirin  81 mg Per Tube Daily  . clonazePAM  0.5 mg Oral QHS  . feeding supplement  30 mL Per Tube TID WC  . feeding supplement (VITAL 1.5 CAL)  237 mL Per Tube Custom  . ferrous sulfate  300 mg Per Tube BID WC  . free water  150 mL Per Tube TID WC & HS  . lidocaine  1 patch Transdermal Q24H  . lip balm   Topical BID  . metoprolol tartrate  6.25 mg Per Tube Daily  . multivitamin  5 mL Per Tube Daily  . pantoprazole sodium  40 mg Per Tube Q1200    Continuous Infusions: none    Inda Coke MS, RD, LDN Pager: [phone] After-hours pager: [phone]

## 2012-06-05 NOTE — Progress Notes (Signed)
Recreational Therapy Session Note  Patient Details  Name: Connor Small MRN: [id number] Date of Birth: 1960-08-09 Today's Date: 06/05/2012  Pt participated in animal assisted activity/therapy seated w/c level incorporating RUE to pet the dog.  [place] 06/05/2012, 3:06 PM

## 2012-06-05 NOTE — Significant Event (Signed)
CRITICAL VALUE ALERT  Critical value received:  Hgb=6.8   Date of notification:  06/05/12  Time of notification:  0010  Critical value read back:yes  Nurse who received alert:  Tiney Rouge, RN  MD notified (1st page):  Reesa Chew, PA-C  Time of first page:  0012  MD notified (2nd page):  Time of second page:  Responding MD:  Reesa Chew, PA-C  Time MD responded:  [phone]

## 2012-06-05 NOTE — Progress Notes (Signed)
Social Work Patient ID: Connor Small, male   DOB: [birth year], 52 y.o.   MRN: [id number]  Met with pt and wife today to review plans for tomorrow's d/c.  Both feeling "ready" for d/c.  All DME received yesterday.  HH follow up arranged and family training completed.  Pleased with progress.  Will follow through to d/c.    Harel Repetto, LCSW

## 2012-06-05 NOTE — Progress Notes (Signed)
Occupational Therapy Discharge Summary  Patient Details  Name: Connor Small MRN: [id number] Date of Birth: 11/02/1960  Today's Date: 06/05/2012  Patient has met 7 of 7 long term goals due to improved activity tolerance, improved balance, postural control, ability to compensate for deficits, functional use of  RIGHT upper, RIGHT lower, LEFT upper and LEFT lower extremity, improved attention, improved awareness and improved coordination.  Patient to discharge at overall Mod Assist level.  Patient's care partner is independent to provide the necessary physical assistance at discharge.  Patient has made great progress on CIR. Wife has been present during most of patient's stay and has engaged in education regarding patient's care. Therapist has recommended patient use RUE as much as possible for functional use. Therapist administered right dorsal wrist support splint. Therapist also recommends patient keep fingers in flexion while sleeping (therapist made a hand mit to keep right fingers in flexion).  Reasons goals not met: n/a, all goals met at this time.  Recommendation:  Patient will benefit from ongoing skilled OT services in home health setting to continue to advance functional skills in the area of BADL, iADL and Reduce care partner burden.  Equipment: No equipment provided, no BSC needed at this time (patient with ostomy bag & uses urinal); patient and wife requested to hold off on tub transfer bench at this time.   Reasons for discharge: treatment goals met and discharge from hospital  Patient/family agrees with progress made and goals achieved: Yes  Precautions/Restrictions  Precautions Precautions: Fall Type of Shoulder Precautions: right shoulder subluxation  Precaution Comments:  ostomy, tachycardia, Afib, peg tube (doesn't wear abd binder) Other Brace/Splint: right wrist splint prn Restrictions Weight Bearing Restrictions: No Other Position/Activity Restrictions: support R UE  secondary to   Vital Signs Therapy Vitals Pulse Rate: 104 BP: 100/68 mmHg  ADL - See FIM ADL Equipment Provided: Reacher  Vision/Perception  Vision - History Baseline Vision: Wears glasses only for reading Patient Visual Report: No change from baseline Perception Perception: Within Functional Limits Praxis Praxis: Intact   Cognition Overall Cognitive Status: Within Functional Limits for tasks assessed Arousal/Alertness: Awake/alert Orientation Level: Oriented X4 Sustained Attention: Appears intact Selective Attention: Appears intact Memory: Appears intact Awareness: Appears intact Problem Solving: Appears intact Safety/Judgment: Appears intact  Sensation Sensation Additional Comments: RUE/RLE appear intact Coordination Gross Motor Movements are Fluid and Coordinated: No Fine Motor Movements are Fluid and Coordinated: No Coordination and Movement Description: RUE/hand impaired, LUE/hand WFL  Motor - See Discharge Summary  Trunk/Postural Assessment - See Discharge Summary  Balance - See Discharge Summary  Extremity/Trunk Assessment RUE Assessment RUE Assessment: Exceptions to [facility] RUE AROM (degrees) Overall AROM Right Upper Extremity: Deficits RUE Overall AROM Comments: Patient has limited AROM in shoulder, limited movements from elbow -> fingers. Patient also with shoulder subluxation; same as admission, but patient's overall AROM/PROM/Strength has improved since evaluation RUE PROM (degrees) Overall PROM Right Upper Extremity: Deficits;Due to pain;Due to precautions;Within functional limits for tasks performed RUE Overall PROM Comments: PROM continues to be limited by pain throughout UE movements.  RUE Strength RUE Overall Strength: Deficits RUE Overall Strength Comments: Overall 3(-) -> 3(+) strength throughout shoulder, elbow, forearm. Wrist and fingers is 1/5.  LUE Assessment LUE Assessment: Within Functional Limits LUE AROM (degrees) LUE Overall AROM  Comments: AROM is WFL LUE PROM (degrees) LUE Overall PROM Comments: PROM is WFL LUE Strength LUE Overall Strength Comments: Shoulder overall 3/5, Distal to shoulder overall 4/5  See FIM for current functional status  Bassam Dresch  06/05/2012, 11:02 AM

## 2012-06-05 NOTE — Progress Notes (Signed)
Recreational Therapy Session Note  Patient Details  Name: Connor Small MRN: [id number] Date of Birth: January 24, 1961 Today's Date: 06/05/2012 Time:  1307-1400 Pain: no c/o Skilled Therapeutic Interventions/Progress Updates: Session focused on discharge planning including grad day tasks from PT standpoint.  Pt propelled w/c using BUE's & BLE's with supervision on unit, slide board transfer, ambulation using maxi sky and eva walker.  Addressed question that pt & wife in reference to discharge instructions, recommendations, follow up therapies and use of time once discharged.  Both stated appreciation for care and feel prepared for discharge home tomorrow. Therapy/Group: Co-Treatment  Gabbrielle Mcnicholas 06/05/2012, 4:14 PM

## 2012-06-05 NOTE — Progress Notes (Signed)
Physical Therapy Discharge Summary  Patient Details  Name: Connor Small MRN: [id number] Date of Birth: Jul 06, 1960  Today's Date: 06/05/2012 Time: 1300-1355 Time Calculation (min): 55 min Individual therapy; Co-treatment with Recreational Therapy. Focused session on completing grad day activities including w/c propulsion on unit, slideboard transfers with close S/to min A level to [place]hill surface from w/c <-> mat, bed mobility with min A from flat surface without rails (pt will have hospital bed for home though), and using maxi sky to work on neuro re-ed in standing for gait training and up/down 2 steps. During gait training, needed cues and manual facilitation for upright posture, hip and knee extension, and neck extension. Excellent participation throughout session and family and pt feel prepared for d/c tomorrow.   Patient has met 8 of 8 long term goals due to improved activity tolerance, improved balance, improved postural control, increased strength, increased range of motion, decreased pain, ability to compensate for deficits, functional use of  right upper extremity, right lower extremity, left upper extremity and left lower extremity, improved attention, improved awareness and improved coordination.  Patient to discharge at a wheelchair level min to Mod Assist for slideboard transfers and S for short distance w/c mobility. Patient's family (wife and daughter)  is independent to provide the necessary physical assistance at discharge.  Reasons goals not met: n/a  Recommendation:  Patient will benefit from ongoing skilled PT services in home health setting to continue to advance safe functional mobility, address ongoing impairments in balance, strength, endurance, functional mobility, transfers, w/c mobility, abnormal tone, and minimize fall risk.  Equipment: hospital bed, sliding board, 18x18 w/c with J2 cushion and basic back  Reasons for discharge: treatment goals met and discharge from  hospital  Patient/family agrees with progress made and goals achieved: Yes  PT Discharge Precautions/Restrictions Precautions Precautions: Fall Type of Shoulder Precautions: right shoulder subluxation  Precaution Comments:  ostomy, tachycardia, Afib, peg tube (doesn't wear abd binder) Other Brace/Splint: right wrist splint prn Restrictions Weight Bearing Restrictions: No Other Position/Activity Restrictions: support R UE secondary to  Pain  reports back pain - premedicated.  Vision/Perception  Vision - History Baseline Vision: Wears glasses only for reading Patient Visual Report: No change from baseline Perception Perception: Within Functional Limits Praxis Praxis: Intact  Cognition Overall Cognitive Status: Within Functional Limits for tasks assessed Arousal/Alertness: Awake/alert Orientation Level: Oriented X4 Sustained Attention: Appears intact Selective Attention: Appears intact Memory: Appears intact Awareness: Appears intact Problem Solving: Appears intact Safety/Judgment: Appears intact Sensation Sensation Light Touch: Appears Intact (decreased in upper thigh BLE;) Proprioception: Appears Intact Additional Comments: RUE/RLE appear intact Coordination Gross Motor Movements are Fluid and Coordinated: No Fine Motor Movements are Fluid and Coordinated: No Coordination and Movement Description: RUE hand impaired; LE coordination impaired/ataxic with gait Motor  Motor Motor: Abnormal tone;Ataxia;Abnormal postural alignment and control;Motor impersistence;Hemiplegia    Locomotion  W/c propulsion in controlled environment 33' with S using bilateral LE's and UE's.  In Maxi-Sky with Carley Hammed walker and +2 assist: Gait Gait: Yes Gait Pattern: Impaired Gait Pattern: Decreased step length - right;Decreased step length - left;Decreased dorsiflexion - left;Decreased dorsiflexion - right;Ataxic;Trunk flexed;Narrow base of support;Decreased trunk rotation  Trunk/Postural  Assessment  Cervical Assessment Cervical Assessment: Exceptions to [facility] (forward flexed) Thoracic Assessment Thoracic Assessment: Exceptions to [facility] (kyphotic) Lumbar Assessment Lumbar Assessment: Exceptions to [facility] (posterior pelvic tilt) Postural Control Postural Control: Deficits on evaluation (in standing)  Balance Static Sitting Balance Static Sitting - Level of Assistance: 6: Modified independent (Device/Increase time) Dynamic  Sitting Balance Dynamic Sitting - Level of Assistance: 5: Stand by assistance Static Standing Balance Static Standing - Level of Assistance: 3: Mod assist (+2 sit to stand; mod A with UE support with Fara Boros) Extremity Assessment  RUE Assessment RUE Assessment: Exceptions to [facility] RUE AROM (degrees) Overall AROM Right Upper Extremity: Deficits RUE Overall AROM Comments: Patient has limited AROM in shoulder, limited movements from elbow -> fingers. Patient also with shoulder subluxation; same as admission, but patient's overall AROM/PROM/Strength has improved since evaluation RUE PROM (degrees) Overall PROM Right Upper Extremity: Deficits;Due to pain;Due to precautions;Within functional limits for tasks performed RUE Overall PROM Comments: PROM continues to be limited by pain throughout UE movements.  RUE Strength RUE Overall Strength: Deficits RUE Overall Strength Comments: Overall 3(-) -> 3(+) strength throughout shoulder, elbow, forearm. Wrist and fingers is 1/5.  LUE Assessment LUE Assessment: Within Functional Limits LUE AROM (degrees) LUE Overall AROM Comments: AROM is WFL LUE PROM (degrees) LUE Overall PROM Comments: PROM is WFL LUE Strength LUE Overall Strength Comments: Shoulder overall 3/5, Distal to shoulder overall 4/5 RLE Assessment RLE Assessment: Exceptions to [facility] RLE Strength RLE Overall Strength: Deficits RLE Overall Strength Comments: 3-/5 overall; decreased proximally functionally LLE Assessment LLE Assessment: Exceptions to  [facility] LLE Strength LLE Overall Strength: Deficits LLE Overall Strength Comments: 3+/5 grossly; weaker proximally functionally  See FIM for current functional status  Karolee Stamps [facility] 06/05/2012, 2:22 PM

## 2012-06-05 NOTE — Progress Notes (Signed)
Patient's Hgb=6.8, critical value called by D. Bradley at [employer]. Algis Liming, PA notified and ordered to recheck CBC at 0500. Patient is stable, resting comfortably, and repositioned. Wife at bedside.

## 2012-06-05 NOTE — Progress Notes (Signed)
Subjective/Complaints: Slept well. No complaints.   A 12 point review of systems has been performed and if not noted above is otherwise negative.   Objective: Vital Signs: Blood pressure 98/62, pulse 108, temperature 98.2 F (36.8 C), temperature source Oral, resp. rate 19, height 5' 10"  (1.778 m), weight 62.98 kg (138 lb 13.5 oz), SpO2 97.00%. Dg Chest Port 1 View  06/04/2012  *RADIOLOGY REPORT*  Clinical Data: Follow up of pleural effusion.  Chronic diarrhea. Ulcerative colitis.  PORTABLE CHEST - 1 VIEW  Comparison: 05/30/2012  Findings: Normal heart size.  The left-sided pleural effusion is slightly decreased in size.  There may be minimal loculation laterally.  The right-sided pleural effusion is resolved. No pneumothorax.  Resolved right and improved left base air space disease.  IMPRESSION: Improved aeration, with decreased left-sided pleural effusion. This may have minimal loculation laterally, similar.  Improved left base airspace disease, likely atelectasis.  Resolved right-sided pleural effusion and airspace disease.   Original Report Authenticated By: Abigail Miyamoto, M.D.     Recent Labs  06/04/12 2335 06/05/12 0620  WBC 13.1* 11.0*  HGB 6.8* 8.1*  HCT 21.2* 26.1*  PLT 458* 464*    Recent Labs  06/04/12 0500  NA 137  K 3.9  CL 103  GLUCOSE 98  BUN 28*  CREATININE 0.87  CALCIUM 9.4   CBG (last 3)  No results found for this basename: GLUCAP,  in the last 72 hours  Wt Readings from Last 3 Encounters:  06/05/12 62.98 kg (138 lb 13.5 oz)  05/07/12 66.2 kg (145 lb 15.1 oz)  04/18/12 71.94 kg (158 lb 9.6 oz)    Physical Exam:  Constitutional: He appears well-developed. He has a sickly appearance.   HENT:  Head: Normocephalic and atraumatic.  Eyes: Pupils are equal, round, and reactive to light.  Neck: Normal range of motion.  Cardiovascular: Regular rhythm. Tachycardia present. .  Pulmonary/Chest: Effort normal. No respiratory distress.  He has no wheezes.  Decreased sounds at the bases but better Abdominal: He exhibits minimal distension. Bowel sounds are increased. There is tenderness.  Midline incision with retention sutures almost completely healed Neurological: He is alert.  Voice quality better. Fair insight and awareness. 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 UE grossly  2to 3/5 LUE, 1-2/5 RUE with contracture in Hand (limited flexion). LE 1 to 2+/5 with weakness more in ankle dorsiflexion. DTR's 1+. Diffuse muscle wasting persistent. Good PROM in all 4's.  Skin: petechial rash along back, both ankles, left elbow with maturation and dissipation Psychiatric: relaxed  Assessment/Plan:  1. 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 Physiatrist is providing close team supervision and 24 hour management of active medical problems listed below. Physiatrist and rehab team continue to assess barriers to discharge/monitor patient progress toward functional and medical goals.  OK medically for dc tomorrow    FIM: FIM - Bathing Bathing Steps Patient Completed: Chest;Right Arm;Abdomen;Front perineal area;Right upper leg;Left upper leg Bathing: 3: Mod-Patient completes 5-7 33f10 parts or 50-74% (supine for LB in w/c for upper body)  FIM - Upper Body Dressing/Undressing Upper body dressing/undressing steps patient completed: Thread/unthread right sleeve of pullover shirt/dresss;Thread/unthread left sleeve of pullover shirt/dress;Put head through opening of pull over shirt/dress;Pull shirt over trunk Upper body dressing/undressing: 5: Supervision: Safety issues/verbal cues FIM - Lower Body Dressing/Undressing Lower body dressing/undressing steps patient completed: Thread/unthread right pants leg;Thread/unthread left pants  leg Lower body dressing/undressing: 2: Max-Patient  completed 25-49% of tasks  FIM - Toileting Toileting: 0: Activity did not occur  FIM - Air cabin crew Transfers: 0-Activity did not occur  FIM - Control and instrumentation engineer Devices: HOB elevated;Bed rails;Arm rests;Sliding board Bed/Chair Transfer: 5: Supine > Sit: Supervision (verbal cues/safety issues);5: Sit > Supine: Supervision (verbal cues/safety issues);4: Bed > Chair or W/C: Min A (steadying Pt. > 75%);4: Chair or W/C > Bed: Min A (steadying Pt. > 75%)  FIM - Locomotion: Wheelchair Distance: 30 Locomotion: Wheelchair: 2: Travels 50 - 149 ft with supervision, cueing or coaxing FIM - Locomotion: Ambulation Locomotion: Ambulation Assistive Devices: MaxiSky;Ethelene Hal Ambulation/Gait Assistance: 1: +2 Total assist Locomotion: Ambulation: 1: Two helpers  Comprehension Comprehension Mode: Auditory Comprehension: 6-Follows complex conversation/direction: With extra time/assistive device  Expression Expression Mode: Verbal Expression: 5-Expresses complex 90% of the time/cues < 10% of the time  Social Interaction Social Interaction Mode: Not assessed Social Interaction: 5-Interacts appropriately 90% of the time - Needs monitoring or encouragement for participation or interaction.  Problem Solving Problem Solving Mode: Not assessed Problem Solving: 5-Solves complex 90% of the time/cues < 10% of the time  Memory Memory Mode: Not assessed Memory: 4-Recognizes or recalls 75 - 89% of the time/requires cueing 10 - 24% of the time  Medical Problem List and Plan:  1. DVT Prophylaxis/Anticoagulation: Mechanical: Sequential compression devices, below knee Bilateral lower extremities thrombocytopenia resolved.   2. Multifactorial pain: tylenol, local measures, use ultram for more severe pain, HA 3. Mood: neuropsych consult requested. Needs encouragement  -klonopin changed to 0.68m qhs--this seems to work well 4. Neuropsych: This patient is not capable  of making decisions on his/her own behalf.  5. FEN::   Serial labs, adjusting fluids, formula as well. Labs good today. Increased h2O boluses  -bolus feeds, with po attempts as well  -push po fluids 6. Bilateral cardioembolic CVA: no blood thinners due to issues with bleeding 7. Anemia---likely ACD based on labwork.:  No further aranesp at this time  -counts holding around 8 (not sure how we got the 6.8 last night)  -no signs of overt blood loss  -continue [id number] 8. 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 9. Low grade temp/leukocytosis-   -peri-splenic and small bowel fluid collections,  pleural fluid collections  -WBC's 11K  -holding further abx pending aspirations of abdominal fluid- no organisms seen. cx pending   -appreciate pulmonary/ccs/ID/surgery assist- outpt pulmonary follow up  -temps seem to be slightly lower  -pt denies feelling poorly  -peritoneal fluid culture negative  -f/u CXR with improvement   10. Petechial rash: resolving  -likely drug reaction (?CT contrast, aspiration med)    LOS (Days) 27 A FACE TO FACE EVALUATION WAS PERFORMED  Daytona Hedman T 06/05/2012 7:44 AM

## 2012-06-05 NOTE — Progress Notes (Signed)
Occupational Therapy Session Notes  Patient Details  Name: Connor SPEASE MRN: [id number] Date of Birth: July 09, 1960  Today's Date: 06/05/2012  Short Term Goals: Week 1:  OT Short Term Goal 1 (Week 1): Self Feeding:  Patient will feed himself at least 50% of 2 meals per day during this recording period. OT Short Term Goal 1 - Progress (Week 1): Progressing toward goal OT Short Term Goal 2 (Week 1): Grooming:  Patient will perform 2 grooming tasks with min assist to include set up once items needed are provided OT Short Term Goal 2 - Progress (Week 1): Progressing toward goal OT Short Term Goal 3 (Week 1): UB Dressing:  Patient will don shirt in supported sit with max assist OT Short Term Goal 3 - Progress (Week 1): Met OT Short Term Goal 4 (Week 1): Sitting balance:  Patient will sit without back support with mod assist while completing simple BADL task at least 2 days this recording period. OT Short Term Goal 4 - Progress (Week 1): Met OT Short Term Goal 5 (Week 1): UE exercises:  Patient will tolerate BUE exercises at least 4 times during this recording period. OT Short Term Goal 5 - Progress (Week 1): Met  Week 2:  OT Short Term Goal 1 (Week 2): Patient will perform grooming tasks with minimal assistance OT Short Term Goal 1 - Progress (Week 2): Met OT Short Term Goal 2 (Week 2): Patient will perform LB dressing with moderate assistance (pants only) OT Short Term Goal 2 - Progress (Week 2): Met OT Short Term Goal 3 (Week 2): Patient will be able to direct care with slide board transfer with min verbal cues OT Short Term Goal 3 - Progress (Week 2): Met OT Short Term Goal 4 (Week 2): Patient will be able to direct UE exercises with min verbal cues OT Short Term Goal 4 - Progress (Week 2): Met  Week 3:  OT Short Term Goal 1 (Week 3): Short Term Goals = Long Term Goals  Skilled Therapeutic Interventions/Progress Updates:   Session #1 [phone] - 60 Minutes Individual Therapy No  complaints of pain Patient found supine in bed. Therapist engaged patient in RUE PROM & stretching exercises; educating patient and wife while completing exercises. Patient then sat edge of bed with supervision for UB/LB dressing, using reacher prn for LB dressing. Encouraged patient to use long handled shoe horn to assist with donning shoes. Patient's wife then assisted patient with mod assist edge of bed -> w/c slide board transfer. At end of session, left patient seated in w/c with wife assisting with w/c management and positioning.   Session #2 [id number] - 34 Minutes Individual Therapy No complaints of pain Patient found seated in w/c upon entering room. Therapist propelled patient -> ADL apartment for tub/shower transfer on/off tub transfer bench. Patient performed squat pivot transfer w/c -> tub bench with moderate assistance from therapist and patient required assistance with BLEs. From there, patient's wife assisted patient with transfer tub bench -> w/c, squat pivot with moderate assistance. From there, wife propelled patient back to room and therapist discussed d/c planning and OT home health with wife and patient.   Precautions:  Precautions Precautions: Fall Type of Shoulder Precautions: right shoulder subluxation  Precaution Comments:  ostomy, tachycardia, Afib, peg tube (doesn't wear abd binder anymore) Required Braces or Orthoses: Other Brace/Splint Other Brace/Splint: right wrist splint Restrictions Weight Bearing Restrictions: No  See FIM for current functional status  Kawanda Drumheller 06/05/2012, 7:31 AM

## 2012-06-06 MED ORDER — ADULT MULTIVITAMIN LIQUID CH: 5.0000 mL | Freq: Every day | ORAL | Status: DC

## 2012-06-06 MED ORDER — PANTOPRAZOLE SODIUM 40 MG PO PACK: 40.0000 mg | PACK | Freq: Every day | ORAL | Status: DC

## 2012-06-06 MED ORDER — METOPROLOL TARTRATE 25 MG PO TABS: 12.5000 mg | ORAL_TABLET | Freq: Every day | ORAL | Status: DC

## 2012-06-06 MED ORDER — METOPROLOL TARTRATE 25 MG PO TABS
25.0000 mg | ORAL_TABLET | Freq: Every day | ORAL | Status: DC
  Filled 2012-06-06: qty 1

## 2012-06-06 MED ORDER — TRAMADOL HCL 50 MG PO TABS: 50.0000 mg | ORAL_TABLET | Freq: Four times a day (QID) | ORAL | Status: DC | PRN

## 2012-06-06 MED ORDER — CLONAZEPAM 0.5 MG PO TABS: 0.5000 mg | ORAL_TABLET | Freq: Every day | ORAL | Status: DC

## 2012-06-06 MED ORDER — LIDOCAINE 5 % EX PTCH: MEDICATED_PATCH | CUTANEOUS | Status: DC

## 2012-06-06 MED ORDER — METOPROLOL TARTRATE 25 MG/10 ML ORAL SUSPENSION: 6.2500 mg | Freq: Every day | ORAL | Status: DC

## 2012-06-06 MED ORDER — METOPROLOL TARTRATE 25 MG PO TABS
25.0000 mg | ORAL_TABLET | Freq: Every day | ORAL | Status: DC
  Filled 2012-06-06 (×2): qty 1

## 2012-06-06 MED ORDER — SIMETHICONE 40 MG/0.6ML PO SUSP: 80.0000 mg | Freq: Four times a day (QID) | ORAL | Status: DC | PRN

## 2012-06-06 MED ORDER — FERROUS SULFATE 300 (60 FE) MG/5ML PO SYRP: 300.0000 mg | ORAL_SOLUTION | Freq: Two times a day (BID) | ORAL | Status: DC

## 2012-06-06 MED ORDER — PROCHLORPERAZINE MALEATE 10 MG PO TABS: 10.0000 mg | ORAL_TABLET | Freq: Four times a day (QID) | ORAL | Status: DC | PRN

## 2012-06-06 MED ORDER — FREE WATER: 150.0000 mL | Freq: Three times a day (TID) | Status: DC

## 2012-06-06 MED ORDER — ASPIRIN 81 MG PO CHEW: 81.0000 mg | CHEWABLE_TABLET | Freq: Every day | ORAL | Status: DC

## 2012-06-06 NOTE — Discharge Summary (Signed)
Physician Discharge Summary  Patient ID: Connor Small MRN: [id number] DOB/AGE: 1960/06/17 52 y.o.  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  Significant Diagnostic Studies: Dg Chest 2 View  05/22/2012   RADIOLOGY REPORT*  Clinical Data: Fever and cough  CHEST - 2 VIEW  Comparison: 05/16/2012  Findings: Moderate left pleural effusion has increased from the prior study.  Hazy opacity projects throughout the left lung which may be due to the superimposed pleural fluid or asymmetric edema or infiltrate.  The right lung is clear.  The cardiac silhouette is normal in size.  No pneumothorax.  IMPRESSION: Enlarged left pleural effusion.  Possible left lung edema or infiltrates.  Clear right lung.   Original Report Authenticated By: Amie Portland, M.D.   Ct Abdomen Pelvis W Contrast  05/28/2012   ADDENDUM-- CREATED: 05/28/2012 14:34:16  There is a typographical error in the first paragraph of the abdomen and pelvis portion of the report.  "This measures 4.7 x 810.3 by 9.5 cm."  This should read:   "This measures 4.7 x 8.3 x 9.5 cm."  --END ADDENDUM SIGNED BY: Rosealee Albee, M.D.  05/28/2012   *RADIOLOGY REPORT*  Clinical Data:  Fever and leukocytosis  CT CHEST, ABDOMEN AND PELVIS WITH CONTRAST  Technique:  Multidetector CT imaging of the chest, abdomen and pelvis was performed following the standard protocol during bolus administration of intravenous contrast.  Contrast: OMNIPAQUE IOHEXOL 300 MG/ML  SOLN  Comparison:  CT abdomen and pelvis 04/24/2012.  CT CHEST  Findings:  There is a large left pleural effusion.  Moderate right pleural effusion is  present.  Compressive type consolidation and atelectasis is noted within the left midlung.  The heart size is normal.  There is no pericardial effusion.  No enlarged lymph node within the mediastinum or hilar region.  Review of the visualized bony structures is unremarkable.  No worrisome lytic or sclerotic bone lesions.  IMPRESSION:  1.  Bilateral pleural effusions, left greater than right.  CT ABDOMEN AND PELVIS  Findings:  There is no focal liver abnormality.  Gallbladder appears normal.  No biliary dilatation.  The pancreas is unremarkable.  There is a large fluid collection overlying the spleen.  This measures 4.7 x 810.3 by 9.5 cm.  A second fluid collection encases the small bowel loops and extends into the right upper quadrant along the anterior margin of the right hepatic lobe.  The adrenal glands are both normal.  Normal appearance of the right kidney.  Multiple hypodensities are noted within the left kidney which may represent cysts.  Urinary bladder appears normal.  The prostate gland and seminal vesicles are unremarkable.  There is no adenopathy within the upper abdomen.  There is no pelvic or inguinal adenopathy.  The patient has a gastrostomy tube. The small bowel loops have a normal caliber.  The right lower quadrant ileostomy noted.  The patient is status post subtotal colectomy.  Review of the visualized bony structures is unremarkable.  IMPRESSION:  1.  There are two fluid collections within the abdomen. This likely accounts for the patient's persistent fever and leukocytosis.  The first is in  the left upper quadrant and extends around the spleen. The second encases the small bowel loops and extends along the anterior margin of the liver.  These should be amendable to percutaneous drainage under image guidance. 2.  Gastrostomy. 3.  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  Original Report Authenticated By: Signa Kell, M.D.   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  Request is made for diagnostic paracentesis.  ULTRASOUND GUIDED DIAGNOSTIC  PARACENTESIS  An ultrasound guided paracentesis was thoroughly discussed with the patient/patient's wife and questions answered.  The benefits, risks, alternatives and complications were also discussed.  The patient/patient's wife understands and wishes to proceed with the procedure.  Written consent was obtained.  Ultrasound was performed to localize and mark an adequate pocket of fluid in the right mid to lower quadrant of the abdomen.  The area was then prepped and draped in the normal sterile fashion.  1% Lidocaine was used for local anesthesia.  Under ultrasound guidance a 19 gauge Yueh catheter was introduced.  Paracentesis was performed.  The catheter was removed and a dressing applied.  Complications:  none  Findings:  A total of approximately 6 cc's of clear, yellow fluid was removed.  A fluid sample was sent for culture/sensitivity.  IMPRESSION: Successful ultrasound guided diagnostic  paracentesis yielding 6 cc's of ascites.  Read by: Jeananne Rama, P.A.-C   Original Report Authenticated By: Jolaine Click, M.D.   Dg Chest Port 1 View  06/04/2012   *RADIOLOGY REPORT*  Clinical Data: Follow up of pleural effusion.  Chronic diarrhea. Ulcerative colitis.  PORTABLE CHEST - 1 VIEW  Comparison: 05/30/2012  Findings: Normal heart size.  The left-sided pleural effusion is slightly decreased in size.  There may be minimal loculation laterally.  The right-sided pleural effusion is resolved. No pneumothorax.  Resolved right and improved left base air space disease.  IMPRESSION: Improved aeration, with decreased left-sided pleural effusion. This may have minimal loculation laterally, similar.  Improved left base airspace disease, likely  atelectasis.  Resolved right-sided pleural effusion and airspace disease.   Original Report Authenticated By: Jeronimo Greaves, M.D.    Labs:  Basic Metabolic Panel:  Recent Labs Lab 06/01/12 0615 06/04/12 0500  NA 135 137  K 4.2 3.9  CL 99 103  CO2 21 25  GLUCOSE 99 98  BUN 26* 28*  CREATININE 1.10 0.87  CALCIUM 9.8 9.4    CBC:  Recent Labs Lab 06/04/12 0500 06/04/12 2335 06/05/12 0620  WBC 10.9* 13.1* 11.0*  HGB 8.1* 6.8* 8.1*  HCT 25.8* 21.2* 26.1*  MCV 91.5 90.2 90.9  PLT 478* 458* 464*    CBG: No results found for this basename: GLUCAP,  in the last 168 hours  Brief HPI:   Connor Small is a 52 year old male originally admitted via [facility] with c-diff colitis with toxic megacolon requiring total colectomy. 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 He was admitted to [facility] on 04/14/12 for rehab. He was noted to have poor po intake with continued GI issues. He  has aspiration event due to N/V with aspiration pneumonitis with leucocytosis. He was treated with 5 day course of antibiotics and due to concerns of HCAP was transfered to acute on 04/25/12. Wound was I and D and treated with VAC to promote healing. PEG was placed by IVR on 04/05 to help with nutritional status and endurance. As mentation and endurance improved, patient was transferred back to CIR to complete his rehab program.    Hospital Course: Connor Small was admitted to rehab 05/09/2012 for inpatient therapies to consist of PT, ST and OT at least three hours five days a week. Past admission physiatrist, therapy team and rehab RN have worked together to provide customized collaborative  inpatient rehab.Vitals have been monitored on bid basis and BP has been on low side but patient without orthostatic symptoms. He has had  tachycardia with activity due to deconditioning. Low dose lopressor 6.25 mg was used during this stay. GI symptom have gradually improved with good tolerance of po's and resolution of diarrhea. He has been receiving multiple supplement  During the stay to help build up his protein stores. Anxiety has been treated with ego support as well as low dose klonopin at bedtime. He has a very supportive family and they have been present throughout his stay.   He was treated with aranesp as well as IV iron to help with anemia of chronic illness. His H/H is slowly improving. He has has persistent leucocytosis and he did develop low grade fevers. BLE dopplers were repeated showing anechoic area proximal right calf that could be isolated thrombosis v/s muscle tear. Urine culture was negative. PCCM was consulted for input as to whether  his left pleural effusion could be cause of elevated WBC and fever. Left effusion was tapped for 1750 ml orange tinged fluid. Cultures negative for infection. ADA pleural fluid at 8.1 U/L.  CT abdomen and pelvis done revealed fluid collections around spleen and abdominal cavity. IVR was contacted for US guided paracentesis however this yielded  6 cc yellow fluid only. Cultures were negative. Dr. Tish Men with infectious disease was consulted for input and recommended continued monitoring off antibiotics. His WBC is down to 11.0 at time of discharge.   He did develop petechial rash on his extremities past XRAY procedure question due to drug reaction v/s aspiration medication. No pain or pruritis reported. PT/INT,  PTT, and platelet function assay have been WNL.  Patient and family were instructed to monitor this for any further spread and to follow up with dermatology is this worsened or did not improve.  Right shoulder subluxation has been treated with ROM,  positioning as well as lidocaine patch for pain management. Midline abdominal wound has closed in nicely and is being managed with dry dressing daily.  Patient has shown great improvement in endurance as well as mobility. His wife and daughter have been supportive and have worked with therapists and nursing on providing hands on care. On 06/06/12, patient is discharged to home in improved condition.    Rehab course: During patient's stay in rehab weekly team conferences were held to monitor patient's progress, set goals and discuss barriers to discharge. Speech therapy had focused on diet tolerance with utilization of aspiration precautions, strategies to improve attention and memory as well as diaphragmatic breathing exercises to help improve vocal quality.  Patient's showed good progress and was independent for utilization of memory strategies as well as ability to express wants and needs.  Speech was 100% intelligible and he was  tolerating regular diet without signs or symptoms of aspiration.  Therefor speech therapy signed off on 05/07 and no further follow up needed.  Occupational therapy has worked with patient on ADL tasks, as well as endurance and ROM. Patient was fitted with right dorsal wrist splint and wife was educated on using RUE as much as possible for functional use and keeping fingers in flexion when sleeping.  Physical therapy has worked with patient on posture, balance and strengthening. He is showing improvement in ability to BUE/BLE with improved awareness and improved coordination. PT has worked on using maxi sky to work on neuro re-ed in standing and posture with manual facilitation for upright posture, hip and knee extension, and neck extension.   He is at  min to Mod Assist for slideboard transfers and  Supervision for propelling wheelchair for short distance./c mobility.    Disposition: 01-Home or Self Care   Diet: Regular.  [employer] bid for 2 weeks. Additional nutritional  supplements 2-3 times a day.   Special Instructions:  1. Keep wound clean and dry. Place dry dressing on open areas till fully healed. Call surgeon for any evidence of infection--redness, swelling, purulent drainage or fever.  2. [facility] (Phone: [phone]) to provide PT, OT, RN    3. Medications can be taken by mouth or by PEG.                        Future Appointments Provider Department Dept Phone   06/26/2012 10:00 AM Ranelle Oyster, MD [facility] [phone]   06/28/2012 2:30 PM Leslye Peer, MD [facility] [phone]       Medication List    TAKE these medications       aspirin 81 MG chewable tablet  Place 1 tablet (81 mg total) into feeding tube daily.     clonazePAM 0.5 MG tablet  Commonly known as:  KLONOPIN  Take 1 tablet (0.5 mg total) by mouth at bedtime. For anxiety/sleep     ferrous sulfate 300 (60 FE) MG/5ML syrup  Place 5 mLs (300 mg total) into feeding tube 2 (two) times daily with a meal. For anemia.     free water Soln  Place 150 mLs into feeding tube 4 (four) times daily -  with meals and at bedtime.     lidocaine 5 %  Commonly known as:  LIDODERM  Apply one patch to right shoulder at 6 am and remove at 6 pm daily.     metoprolol tartrate 25 mg/10 mL Susp  Commonly known as:  LOPRESSOR  Place 2.5 mLs (6.25 mg total) into feeding tube daily. For blood pressure and heart     metoprolol tartrate 25 MG tablet  Commonly known as:  LOPRESSOR  Take 0.5 tablets (12.5 mg total) by mouth daily.     multivitamin Liqd  Place 5 mLs into feeding tube daily.     pantoprazole sodium 40 mg/20 mL Pack  Commonly known as:  PROTONIX  Place 20 mLs (40 mg total) into feeding tube daily at 12 noon.     prochlorperazine 10 MG tablet  Commonly known as:  COMPAZINE  Take 1 tablet (10 mg total) by mouth every 6 (six) hours as needed.     simethicone 40 MG/0.6ML drops  Commonly known as:  MYLICON  Take  1.2 mLs (80 mg total) by mouth 4 (four) times daily as needed (bloating/ flatulence).  traMADol 50 MG tablet  Commonly known as:  ULTRAM  Place 1 tablet (50 mg total) into feeding tube every 6 (six) hours as needed. For pain           Follow-up Information   Follow up with Ranelle Oyster, MD On 06/26/2012. (be there at   9:30  for  10 am  appointment)    Contact information:   [address], [address] [place] [address] [phone]       Follow up with Gates Rigg, MD. Call today. (for follow up in 4 weeks )    Contact information:   [address] [place] [address] [phone]       Follow up with Mariella Saa, MD. Schedule an appointment as soon as possible for a visit in 3 weeks. (for follow up on abominal surgery/wound)    Contact information:   [address] [place] [address] [phone]       Call Barrie Folk, MD. (As needed for stomach problems)    Contact information:   [address]                         [place] [address] [phone]       Follow up with Eartha Inch, MD On 06/14/2012. (@ 11:30 am)    Contact information:   [address] [place] [address] [phone]       Follow up with Leslye Peer., MD On 06/28/2012. (Be there at 2:15 pm)    Contact information:   [address] [place] [address] [phone]       Call Johny Sax, MD. (As needed )    Contact information:   [address]  [address] [place] [address] [phone]       Signed: Jacquelynn Cree 06/06/2012, 4:29 PM

## 2012-06-06 NOTE — Progress Notes (Signed)
Subjective/Complaints: Slept well. Excited to go home  A 12 point review of systems has been performed and if not noted above is otherwise negative.   Objective: Vital Signs: Blood pressure 101/67, pulse 105, temperature 99 F (37.2 C), temperature source Oral, resp. rate 18, height 5' 10"  (1.778 m), weight 67.3 kg (148 lb 5.9 oz), SpO2 99.00%. No results found.  Recent Labs  06/04/12 2335 06/05/12 0620  WBC 13.1* 11.0*  HGB 6.8* 8.1*  HCT 21.2* 26.1*  PLT 458* 464*    Recent Labs  06/04/12 0500  NA 137  K 3.9  CL 103  GLUCOSE 98  BUN 28*  CREATININE 0.87  CALCIUM 9.4   CBG (last 3)  No results found for this basename: GLUCAP,  in the last 72 hours  Wt Readings from Last 3 Encounters:  06/06/12 67.3 kg (148 lb 5.9 oz)  05/07/12 66.2 kg (145 lb 15.1 oz)  04/18/12 71.94 kg (158 lb 9.6 oz)    Physical Exam:  Constitutional: He appears well-developed. He has a sickly appearance.   HENT:  Head: Normocephalic and atraumatic.  Eyes: Pupils are equal, round, and reactive to light.  Neck: Normal range of motion.  Cardiovascular: Regular rhythm. Tachycardia present. .  Pulmonary/Chest: Effort normal. No respiratory distress.  He has no wheezes. Decreased sounds at the bases but better Abdominal: He exhibits minimal distension. Bowel sounds are increased. There is tenderness.  Midline incision with retention sutures almost completely healed Neurological: He is alert.  Voice quality better. Fair insight and awareness. 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 UE grossly  2to 3/5 LUE, 1-2/5 RUE with contracture in Hand (limited flexion). LE 1 to 2+/5 with weakness more in ankle dorsiflexion. DTR's 1+. Diffuse muscle wasting persistent. Good PROM in all 4's.  Skin: petechial rash along back, both ankles, left elbow persistent Psychiatric: relaxed  Assessment/Plan:  1. Functional deficits secondary to severe deconditioning, critical illness neuropathy/myopathy after  multiple medical which require 3+ hours per day of interdisciplinary therapy in a comprehensive inpatient rehab setting. Physiatrist is providing close team supervision and 24 hour management of active medical problems listed below. Physiatrist and rehab team continue to assess barriers to discharge/monitor patient progress toward functional and medical goals.  OK medically for dc   Re-check labs next week    FIM: FIM - Bathing Bathing Steps Patient Completed: Chest;Right Arm;Abdomen;Front perineal area;Right upper leg;Left upper leg Bathing: 0: Activity did not occur (no goal at this time)  FIM - Upper Body Dressing/Undressing Upper body dressing/undressing steps patient completed: Thread/unthread right sleeve of pullover shirt/dresss;Thread/unthread left sleeve of pullover shirt/dress;Put head through opening of pull over shirt/dress;Pull shirt over trunk Upper body dressing/undressing: 5: Supervision: Safety issues/verbal cues FIM - Lower Body Dressing/Undressing Lower body dressing/undressing steps patient completed: Thread/unthread right pants leg;Thread/unthread left pants leg;Don/Doff left shoe Lower body dressing/undressing: 3: Mod-Patient completed 50-74% of tasks  FIM - Toileting Toileting: 0: Activity did not occur  FIM - Air cabin crew Transfers: 0-Activity did not occur (no goal; patient has ostomy and uses urinal)  FIM - Control and instrumentation engineer Devices: Sliding board;Arm rests Bed/Chair Transfer: 4: Supine > Sit: Min A (steadying Pt. > 75%/lift 1 leg);4: Sit > Supine: Min A (steadying pt. > 75%/lift 1 leg);4: Chair or W/C > Bed: Min A (steadying Pt. > 75%);4: Bed > Chair or W/C: Min A (steadying Pt. > 75%)  FIM - Locomotion: Wheelchair Distance: 30 Locomotion: Wheelchair: 2: Travels 50 - 149 ft with supervision, cueing  or coaxing FIM - Locomotion: Ambulation Locomotion: Ambulation Assistive Devices: MaxiSky;Ethelene Hal Ambulation/Gait  Assistance: 1: +2 Total assist Locomotion: Ambulation: 1: Two helpers  Comprehension Comprehension Mode: Auditory Comprehension: 6-Follows complex conversation/direction: With extra time/assistive device  Expression Expression Mode: Verbal Expression: 5-Expresses complex 90% of the time/cues < 10% of the time  Social Interaction Social Interaction Mode: Not assessed Social Interaction: 5-Interacts appropriately 90% of the time - Needs monitoring or encouragement for participation or interaction.  Problem Solving Problem Solving Mode: Not assessed Problem Solving: 5-Solves complex 90% of the time/cues < 10% of the time  Memory Memory Mode: Not assessed Memory: 5-Recognizes or recalls 90% of the time/requires cueing < 10% of the time  Medical Problem List and Plan:  1. DVT Prophylaxis/Anticoagulation: Mechanical: Sequential compression devices, below knee Bilateral lower extremities thrombocytopenia resolved.   2. Multifactorial pain: tylenol, local measures, use ultram for more severe pain, HA 3. Mood: neuropsych consult requested. Needs encouragement  -klonopin changed to 0.8m qhs--this seems to work well 4. Neuropsych: This patient is not capable of making decisions on his/her own behalf.  5. FEN::   Serial labs, adjusting fluids, formula as well. Labs good today. Increased h2O boluses  -bolus feeds, with po attempts as well  -push po fluids 6. Bilateral cardioembolic CVA: no blood thinners due to issues with bleeding 7. Anemia---likely ACD based on labwork.:  No further aranesp at this time  -counts holding around 8 (not sure how we got the 6.8 last night)  -no signs of overt blood loss  -continue fes04 8. 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 9. Low grade temp/leukocytosis-   -peri-splenic and small bowel fluid collections,  pleural fluid  collections  -WBC's 11K  -holding further abx pending aspirations of abdominal fluid- no organisms seen. cx pending   -appreciate pulmonary/ccs/ID/surgery assist- outpt pulmonary follow up  -temps seem to be slightly lower  -pt denies feelling poorly  -peritoneal fluid culture negative  -f/u CXR with improvement  -continue IS. Volumes better   10. Petechial rash: appears a little more prominent today  -no other sx. Labs ok.  -consider outpt derm consult if persistent  -hypoallergenic soap, detergent  -likely drug reaction (?CT contrast, aspiration med)    LOS (Days) 28 A FACE TO FACE EVALUATION WAS PERFORMED  Connor Small 06/06/2012 7:26 AM

## 2012-06-06 NOTE — Progress Notes (Signed)
Recreational Therapy Discharge Summary Patient Details  Name: Connor Small MRN: [id number] Date of Birth: [birth year] Today's Date: 06/06/2012  Long term goals set: 2  Long term goals met: 2  Comments on progress toward goals: Pt has made great progress toward goal meeting Min assist level for simple TR tasks seated and for community pursuits w/c level.  Pt's wife and/or daughter present during all therapy sessions observing and participating. Education provided on importance of staying active and involved in leisure/meaningful activities post discharge, pt stated understanding.     Reasons for discharge: discharge from hospital Patient/family agrees with progress made and goals achieved: Yes  [name] 06/06/2012, 12:15 PM

## 2012-06-06 NOTE — Progress Notes (Signed)
Pt discharged home with wife and daughter, Wife demonstrated giving all medciations through PEG tube with out difficulty, Reesa Chew PA provided discharge instructions and prescriptions, pt and wife both verbalized an understanding and denied any questions or concerns, pt discharged to private vehicle

## 2012-06-06 NOTE — Progress Notes (Signed)
Social Work  Discharge Note  The overall goal for the admission was met for:   Discharge location: Yes - home with wife and daughter to provide 24/7 assist  Length of Stay: Yes - 28 days  Discharge activity level: Yes - moderate assistance w/c level  Home/community participation: Yes  Services provided included: MD, RD, PT, OT, SLP, RN, TR, Pharmacy, Neuropsych and [place]: Private Insurance: [employer] of [place]  Follow-up services arranged: Home Health: RN, PT, OT via [facility], DME: 18x18 [employer] w/c with back cushion and 1/2 laptray, Jay 2 seat cushion, hospital bed, 30" transfer board all via [place] and Patient/Family has no preference for HH/DME agencies  Comments (or additional information):  Patient/Family verbalized understanding of follow-up arrangements: Yes  Individual responsible for coordination of the follow-up plan: Patient  Confirmed correct DME delivered: Lennart Pall 06/06/2012    Helaine Yackel

## 2012-06-11 ENCOUNTER — Telehealth: Payer: Self-pay | Admitting: Neurology

## 2012-06-26 ENCOUNTER — Telehealth: Payer: Self-pay | Admitting: *Deleted

## 2012-06-26 ENCOUNTER — Encounter: Payer: BC Managed Care – PPO | Admitting: Physical Medicine & Rehabilitation

## 2012-06-26 NOTE — Telephone Encounter (Signed)
Requests OT extension 1 wk 1, 1 wk2.Sharlyne Pacas Ok given.

## 2012-06-28 ENCOUNTER — Ambulatory Visit (INDEPENDENT_AMBULATORY_CARE_PROVIDER_SITE_OTHER)
Admission: RE | Admit: 2012-06-28 | Discharge: 2012-06-28 | Disposition: A | Discharge status: Home / Self Care | Payer: BC Managed Care – PPO | Source: Ambulatory Visit | Attending: Emergency Medicine | Admitting: Emergency Medicine

## 2012-06-28 ENCOUNTER — Encounter: Payer: Self-pay | Admitting: Emergency Medicine

## 2012-06-28 ENCOUNTER — Ambulatory Visit (INDEPENDENT_AMBULATORY_CARE_PROVIDER_SITE_OTHER): Payer: BC Managed Care – PPO | Admitting: Emergency Medicine

## 2012-06-28 VITALS — BP 110/62 | HR 132 | Temp 98.6°F | Ht 70.0 in

## 2012-06-28 DIAGNOSIS — G7281 Critical illness myopathy: Secondary | ICD-10-CM

## 2012-06-28 DIAGNOSIS — J90 Pleural effusion, not elsewhere classified: Secondary | ICD-10-CM

## 2012-06-28 DIAGNOSIS — J309 Allergic rhinitis, unspecified: Secondary | ICD-10-CM

## 2012-06-28 HISTORY — DX: Allergic rhinitis, unspecified: J30.9

## 2012-06-28 MED ORDER — LORATADINE 10 MG PO TABS: 10.0000 mg | ORAL_TABLET | Freq: Every day | ORAL | Status: DC

## 2012-06-28 NOTE — Patient Instructions (Addendum)
Please start loratadine 17m daily during the allergy season to see if this helps with cough.  We will perform a CXR today to compare with the hospital.  We will call you with the CXR results Follow with Dr BLamonte Sakaiif your breathing changes or for any problems.

## 2012-06-28 NOTE — Progress Notes (Signed)
Subjective:    Patient ID: Connor Small, male    DOB: April 04, 1960, 52 y.o.   MRN: [id number]  HPI 52 yo man, former tobacco (6 pk-yrs)  Long complicated hospitalization  >> Admitted to [facility] for Cdiff on 2/2. Underwent decompressive cecostomy 2/3, Hospital course complicated by ARDS, PNA, septic shock, bilateral cardioembolic CVAs, renal failure, wound dehiscence and required multiple OR visits. To rehab, aspiration event 3/29-->treated, back to rehab. PCCM asked once again to see on 4/30 for on going low gd temp, left effusion (which has been chronic since feb 2014, but has gotten a little worse).  Seen x 2 in f/u consultation on rehab for recurrent L effusion s/p thora x 2  Has been working on home health PT, hopefully to outpt PT. Feels that his breathing is doing well. Other things limit his activity - endurance. He has had a cough, clear drainage. No f/c/focal sx.   Review of Systems  Constitutional: Positive for unexpected weight change. Negative for fever.  HENT: Negative for ear pain, nosebleeds, congestion, sore throat, rhinorrhea, sneezing, trouble swallowing, dental problem, postnasal drip and sinus pressure.   Eyes: Negative for redness and itching.  Respiratory: Positive for cough and shortness of breath. Negative for chest tightness and wheezing.   Cardiovascular: Negative for palpitations and leg swelling.  Gastrointestinal: Negative for nausea and vomiting.  Genitourinary: Negative for dysuria.  Musculoskeletal: Negative for joint swelling.  Skin: Negative for rash.  Neurological: Negative for headaches.  Hematological: Does not bruise/bleed easily.  Psychiatric/Behavioral: Negative for dysphoric mood. The patient is nervous/anxious.    Past Medical History  Diagnosis Date  . Chronic diarrhea   . Rectal bleed   . Hemorrhoids   . Ulcerative colitis     Distal UC over 8 yrs ago diagnosed  . Diverticulitis of large intestine with perforation 10/2011    done at [facility]  . 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 history of UC (ulcerative colitis) 12/06/2010     Family History  Problem Relation Age of Onset  . Cancer Sister   . Healthy Daughter   . Hypertension Mother      History   Social History  . Marital Status: Married    Spouse Name: N/A    Number of Children: N/A  . Years of Education: N/A   Occupational History  . Not on file.   Social History Main Topics  . Smoking status: Former Smoker -- 0.50 packs/day for 12 years    Types: Cigarettes    Quit date: 07/17/1984  . Smokeless tobacco: Never Used     Comment: Quit 23 yrs ago  . Alcohol Use: No  . Drug Use: No  . Sexually Active: Yes   Other Topics Concern  . Not on file   Social History Narrative  . No narrative on file     Allergies  Allergen Reactions  . Penicillins Other (See Comments)    Heart rate changes  . Morphine And Related     Nausea/vomiting  . Oxycodone     Nausea/vomiting  . Imipenem Rash     Outpatient Prescriptions Prior to Visit  Medication Sig Dispense Refill  . aspirin 81 MG chewable tablet Place 1 tablet (81 mg total) into feeding tube daily.      . clonazePAM (KLONOPIN) 0.5 MG tablet Take 1 tablet (0.5 mg total) by mouth at bedtime. For anxiety/sleep  30 tablet  1  . lidocaine (LIDODERM) 5 % Apply one patch to right shoulder at  6 am and remove at 6 pm daily.  30 patch  1  . metoprolol tartrate (LOPRESSOR) 25 mg/10 mL SUSP Place 2.5 mLs (6.25 mg total) into feeding tube daily. For blood pressure and heart  150 mL  1  . prochlorperazine (COMPAZINE) 10 MG tablet Take 1 tablet (10 mg total) by mouth every 6 (six) hours as needed.  60 tablet  1  . simethicone (MYLICON) 40 MG/0.6ML drops Take 1.2 mLs (80 mg total) by mouth 4 (four) times daily as needed (bloating/ flatulence).  30 mL  0  . traMADol (ULTRAM) 50 MG tablet Place 1 tablet (50 mg total) into feeding tube every 6 (six) hours as needed. For pain  60 tablet  1  . Water For Irrigation, Sterile (FREE  WATER) SOLN Place 150 mLs into feeding tube 4 (four) times daily -  with meals and at bedtime.      . ferrous sulfate 300 (60 FE) MG/5ML syrup Place 5 mLs (300 mg total) into feeding tube 2 (two) times daily with a meal. For anemia.  300 mL  1  . metoprolol tartrate (LOPRESSOR) 25 MG tablet Take 0.5 tablets (12.5 mg total) by mouth daily.  15 tablet  1  . Multiple Vitamin (MULTIVITAMIN) LIQD Place 5 mLs into feeding tube daily.  480 mL  1  . pantoprazole sodium (PROTONIX) 40 mg/20 mL PACK Place 20 mLs (40 mg total) into feeding tube daily at 12 noon.  30 each  1   No facility-administered medications prior to visit.         Objective:   Physical Exam Filed Vitals:   06/28/12 1436  BP: 110/62  Pulse: 132  Temp: 98.6 F (37 C)  TempSrc: Oral  Height: 5\' 10"  (1.778 m)  SpO2: 98%    Gen: Pleasant, thin in wheelchair, in no distress,  normal affect  ENT: No lesions,  mouth clear,  oropharynx clear, no postnasal drip  Neck: No JVD, no TMG, no carotid bruits, slight hoarse voice  Lungs: No use of accessory muscles, no dullness to percussion, clear without rales or rhonchi  Cardiovascular: RRR, heart sounds normal, no murmur or gallops, no peripheral edema  Abdomen: soft and NT, ostomy   Musculoskeletal: No deformities, no cyanosis or clubbing  Neuro: alert, unable to move R UE, able to move B LE but weak  Skin: Warm, no lesions or rashes      Assessment & Plan:  Pleural effusion, L > R Etiology unclear but suspect a degree of malnutrition. Repeat CXR today. Will need to discuss the risks/benefits of a repeat thoracentesis if persistent or increasing fluid. May help his rehab process to drain him dry.   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

## 2012-06-28 NOTE — Assessment & Plan Note (Signed)
Start loratadine

## 2012-06-28 NOTE — Assessment & Plan Note (Signed)
Working hard w rehab

## 2012-06-28 NOTE — Assessment & Plan Note (Signed)
Etiology unclear but suspect a degree of malnutrition. Repeat CXR today. Will need to discuss the risks/benefits of a repeat thoracentesis if persistent or increasing fluid. May help his rehab process to drain him dry.

## 2012-06-29 ENCOUNTER — Inpatient Hospital Stay: Payer: BC Managed Care – PPO | Admitting: Physical Medicine & Rehabilitation

## 2012-07-04 ENCOUNTER — Encounter: Payer: Self-pay | Admitting: Emergency Medicine

## 2012-07-05 ENCOUNTER — Telehealth: Payer: Self-pay | Admitting: Emergency Medicine

## 2012-07-05 NOTE — Progress Notes (Signed)
Quick Note:  Spoke with patient/pt spouse, they are aware of results and recs and have been scheduled to see [facility] 07/10/12 @ 130pm Nothing further needed at this time  ______

## 2012-07-05 NOTE — Telephone Encounter (Signed)
Spoke with patient and patients spouse regarding most recent CXR results/recs. Patient has been scheduled to come in to the office June 17 @ 130pm Per patients spouse she wants to know (would prefer) thoracentesis be done same day in the office so patient does have to make two trips. Patient is double booked at 130pm-- If this is something that you would like to do I can call patient/patients spouse and have them come in earlier Dr. Lamonte Sakai, Please advise, thank you!

## 2012-07-06 NOTE — Telephone Encounter (Signed)
We can just go ahead and order an IR thoracentesis and then he can follow with me after to discuss whether he needs any other procedure to keep this from coming back

## 2012-07-06 NOTE — Telephone Encounter (Signed)
ATC patient x 4 times. Line busy [employer]

## 2012-07-09 ENCOUNTER — Other Ambulatory Visit: Payer: Self-pay | Admitting: *Deleted

## 2012-07-09 ENCOUNTER — Encounter: Payer: Self-pay | Admitting: Emergency Medicine

## 2012-07-09 ENCOUNTER — Telehealth: Payer: Self-pay | Admitting: Emergency Medicine

## 2012-07-09 ENCOUNTER — Encounter
Payer: BC Managed Care – PPO | Attending: Physical Medicine & Rehabilitation | Admitting: Physical Medicine & Rehabilitation

## 2012-07-09 ENCOUNTER — Encounter: Payer: Self-pay | Admitting: Physical Medicine & Rehabilitation

## 2012-07-09 VITALS — BP 91/58 | HR 116 | Resp 14 | Ht 70.0 in | Wt 130.0 lb

## 2012-07-09 DIAGNOSIS — Z5189 Encounter for other specified aftercare: Secondary | ICD-10-CM

## 2012-07-09 DIAGNOSIS — E46 Unspecified protein-calorie malnutrition: Secondary | ICD-10-CM | POA: Insufficient documentation

## 2012-07-09 DIAGNOSIS — I69998 Other sequelae following unspecified cerebrovascular disease: Secondary | ICD-10-CM | POA: Insufficient documentation

## 2012-07-09 DIAGNOSIS — F411 Generalized anxiety disorder: Secondary | ICD-10-CM | POA: Insufficient documentation

## 2012-07-09 DIAGNOSIS — R5381 Other malaise: Secondary | ICD-10-CM | POA: Insufficient documentation

## 2012-07-09 DIAGNOSIS — Z933 Colostomy status: Secondary | ICD-10-CM | POA: Insufficient documentation

## 2012-07-09 DIAGNOSIS — S43001D Unspecified subluxation of right shoulder joint, subsequent encounter: Secondary | ICD-10-CM

## 2012-07-09 DIAGNOSIS — M6289 Other specified disorders of muscle: Secondary | ICD-10-CM

## 2012-07-09 DIAGNOSIS — J90 Pleural effusion, not elsewhere classified: Secondary | ICD-10-CM | POA: Insufficient documentation

## 2012-07-09 DIAGNOSIS — M62838 Other muscle spasm: Secondary | ICD-10-CM | POA: Insufficient documentation

## 2012-07-09 DIAGNOSIS — R29898 Other symptoms and signs involving the musculoskeletal system: Secondary | ICD-10-CM | POA: Insufficient documentation

## 2012-07-09 DIAGNOSIS — G7281 Critical illness myopathy: Secondary | ICD-10-CM | POA: Insufficient documentation

## 2012-07-09 DIAGNOSIS — Z932 Ileostomy status: Secondary | ICD-10-CM

## 2012-07-09 DIAGNOSIS — I6350 Cerebral infarction due to unspecified occlusion or stenosis of unspecified cerebral artery: Secondary | ICD-10-CM

## 2012-07-09 DIAGNOSIS — I639 Cerebral infarction, unspecified: Secondary | ICD-10-CM

## 2012-07-09 MED ORDER — MEGESTROL ACETATE 40 MG/ML PO SUSP: 400.0000 mg | Freq: Two times a day (BID) | ORAL | Status: DC

## 2012-07-09 NOTE — Progress Notes (Signed)
Subjective:    Patient ID: Connor Small, male    DOB: 07/31/1960, 52 y.o.   MRN: [id number]  HPI  Connor Small is back regarding his severe deconditioning and CIM. PT has been coming out to his house 3 days per week. He has been walking short dx within the house with his walker. They are working om muscle strengthening exercises. He is requiring contact guard assistance usually for mobility. Endurance remains the biggest obstacle. He still requires some assistance with his am chores/dressing. His HR increases to the 140's despite still being on the . He is still wearing knee high TEDS's.   From a pain standpoint, he is improving.   His stoma retracted once and there have been some fit issues because of loss of abdominal distention. His fluid collections are still being followed. He sees dr. Solon Augusta in the office for potential tapping of his left pleural effusions. He sees General Surgery back this week.   He is still having problems with appetite. He is continuously feeling nauseas. He has a hard time keeping anything down that he eats as well. He was on reglan for a brief time at the hospital.   Pain Inventory Average Pain 3 Pain Right Now 0 My pain is aching  In the last 24 hours, has pain interfered with the following? General activity 2 Relation with others 2 Enjoyment of life 2 What TIME of day is your pain at its worst? morning Sleep (in general) Fair  Pain is worse with: unsure Pain improves with: rest Relief from Meds: 5  Mobility walk with assistance use a walker how many minutes can you walk? 3-5 ability to climb steps?  no do you drive?  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 Cancer  Sister   . Healthy Daughter   . Hypertension Mother    History   Social History  . Marital Status: Married    Spouse Name: N/A    Number of Children: N/A  . Years of Education: N/A   Social History Main Topics  . Smoking status: Former Smoker -- 0.50 packs/day for 12 years    Types: Cigarettes    Quit date: 07/17/1984  . Smokeless tobacco: Never Used     Comment: Quit 23 yrs ago  . Alcohol Use: No  . Drug Use: No  . Sexually Active: Yes   Other Topics Concern  . None   Social History Narrative  . None   Past Surgical History  Procedure Laterality Date  . Colonoscopy  9/03  . Sigmoidoscopy       06/13/2002  . Temporary ostomy November 06, 2010      for a colon perforation that was done in [facility] (Dr Ruben Im).    . Colonoscopy  02/16/2011    Procedure: COLONOSCOPY;  Surgeon: Malissa Hippo, MD;  Location: [facility];  Service: Endoscopy;  Laterality: N/A;  100  . Flexible sigmoidoscopy  02/27/2012    Procedure: FLEXIBLE SIGMOIDOSCOPY;  Surgeon: Malissa Hippo, MD;  Location: [facility];  Service: Endoscopy;  Laterality: N/A;  with colonic decompression  . Laparotomy  02/27/2012    Procedure: EXPLORATORY LAPAROTOMY;  Surgeon: Fabio Bering, MD;  Location: [facility] ORS;  Service: General;  Laterality: N/A;  . Cecostomy  02/27/2012    Procedure: CECOSTOMY;  Surgeon: Fabio Bering, MD;  Location: [facility]S;  Service: General;  Laterality: N/A;  Cecostomy Tube Placement  . Colectomy  02/28/2012    Procedure: TOTAL COLECTOMY;  Surgeon: Fabio Bering, MD;  Location: [facility] ORS;  Service: General;  Laterality: N/A;  . Ileostomy  02/28/2012    Procedure: ILEOSTOMY;  Surgeon: Fabio Bering, MD;  Location: [facility] ORS;  Service: General;  Laterality: N/A;  . Cystoscopy w/ ureteral stent placement Bilateral 03/20/2012    Procedure: CYSTOSCOPY WITH RETROGRADE PYELOGRAM/URETERAL STENT PLACEMENT;  Surgeon: Sebastian Ache, MD;  Location: [facility] OR;  Service: Urology;  Laterality: Bilateral;  .  Laparotomy N/A 03/26/2012    Procedure: EXPLORATORY LAPAROTOMY;  Surgeon: Mariella Saa, MD;  Location: [facility] OR;  Service: General;  Laterality: N/A;  . Application of wound vac N/A 03/26/2012    Procedure: APPLICATION OF WOUND VAC;  Surgeon: Mariella Saa, MD;  Location: [facility] OR;  Service: General;  Laterality: N/A;  . Liver biopsy N/A 03/26/2012    Procedure: LIVER BIOPSY;  Surgeon: Mariella Saa, MD;  Location: [facility] OR;  Service: General;  Laterality: N/A;  . Laparotomy N/A 03/29/2012    Procedure: EXPLORATORY LAPAROTOMY, PARTIAL WOUND CLOSURE;  Surgeon: Mariella Saa, MD;  Location: [facility] OR;  Service: General;  Laterality: N/A;  . Vacuum assisted closure change N/A 03/29/2012    Procedure: Open ABDOMINAL VACUUM  CHANGE;  Surgeon: Mariella Saa, MD;  Location: [facility] OR;  Service: General;  Laterality: N/A;  . Vacuum assisted closure change N/A 04/01/2012    Procedure: removal of abdominal vac dressing and abdominal closure;  Surgeon: Mariella Saa, MD;  Location: [facility] OR;  Service: General;  Laterality: N/A;   Past Medical History  Diagnosis Date  . Chronic diarrhea   . Rectal bleed   . Hemorrhoids   . Ulcerative colitis     Distal UC over 8 yrs ago diagnosed  . Diverticulitis of large intestine with perforation 10/2011    done at [facility]  . 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 history of UC (ulcerative colitis) 12/06/2010   BP 91/58  Pulse 116  Resp 14  Ht 5\' 10"  (1.778 m)  Wt 130 lb (58.968 kg)  BMI 18.65 kg/m2  SpO2 99%     Review of Systems  Constitutional: Positive for appetite change and unexpected weight change.  Respiratory: Positive for cough.   Musculoskeletal: Positive for gait problem.  Psychiatric/Behavioral: The patient is nervous/anxious.   All other systems reviewed and are negative.       Objective:   Physical Exam  General: Alert and oriented x 3, No apparent distress HEENT: Head is normocephalic, atraumatic, PERRLA, EOMI, sclera anicteric,  oral mucosa pink and moist, dentition intact, ext ear canals clear,  Neck: Supple without JVD or lymphadenopathy Heart: Reg rate and rhythm. No murmurs rubs or gallops Chest: CTA bilaterally without wheezes, rales, or rhonchi; no distress Abdomen: Soft, non-tender, non-distended, bowel sounds positive. Ostomy site intact.  Extremities: No clubbing, cyanosis, or edema. Pulses are 2+ Skin: Clean and intact without signs of breakdown Neuro: Pt is cognitively appropriate with normal insight, memory, and awareness. Cranial nerves 2-12 are intact. Sensory exam is normal. Reflexes are 1+ on the left, 2+ on the right. Fine motor coordination is intact. No tremors. Motor function is grossly 3+ to 4/5 LE with tight hamstrings noted. RUE is  2/5 deltoid, 2+ TO 3 BICEPS, TRICEPS, WE are 1/5, HI are 1/5. Musculoskeletal: Full ROM, No pain with AROM or PROM in the neck, trunk, or extremities. Posture appropriate Psych: Pt's affect is appropriate. Pt is cooperative. Non anxious         Assessment & Plan:  1. Critical illness myopathy/severe deconditioning 2. 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 Anxiety 4. 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 Left pleuaral effusion 6. Protein malnutrition    Plan: 1. Continue with HH PT and OT which i renewed. He needs to work on ROM and contracture prevention on his own.  Would prefer that he transfer to outpt therapies eventually. I renewed an additional 2 weeks of therapies today.  2. Stop TEDS for now. Observe for hypotension and LE edema.  3. Maintain klonopin at bedtime 4. Megace 400mg  bid for appetite.  5. ?Reglan to help peristalsis given reported bouts of nausea after eating. 6. Follow up with me in 6 weeks time. He has made nice progress. 30 minutes of face to face patient care time were spent during this visit. All questions were encouraged and answered.

## 2012-07-09 NOTE — Patient Instructions (Signed)
WORK ON REGULAR STRETCHING OF YOUR RIGHT HAND AND SHOULDER.   CONTINUE WITH PT AND OT AS WELL. I HAVE RENEWED THESE TODAY.    ?REGLAN TRIAL FOR NAUSEA

## 2012-07-09 NOTE — Telephone Encounter (Signed)
Spoke with patient and patients spouse,  Patient would prefer Dr. Lamonte Sakai to do thoracentesis They stated they really do not want to have this procedure done at hospital. Please advise, thank you

## 2012-07-09 NOTE — Telephone Encounter (Signed)
Spoke with Dr. Lamonte Sakai, he will contact patient/spouse and advise.

## 2012-07-10 ENCOUNTER — Telehealth (INDEPENDENT_AMBULATORY_CARE_PROVIDER_SITE_OTHER): Payer: Self-pay | Admitting: *Deleted

## 2012-07-10 ENCOUNTER — Ambulatory Visit: Payer: BC Managed Care – PPO | Admitting: Emergency Medicine

## 2012-07-10 NOTE — Telephone Encounter (Signed)
Per Dr.Rehman , bring the patient in for him to see when he gets back into office (first week in July) Butch Penny was made aware and is going to check with Reba and Mrs. Eliyohu will be made aware. I am also gong to call her and make her aware of our plan.

## 2012-07-10 NOTE — Telephone Encounter (Signed)
Connor Small is losing weight and having trouble with food intake. Offered an apt with Connor Castle, NP and she refused. Was told if I would let Connor Small know, he would make an exception because he spoke to them personally. Advised Connor Small that [place] 2 [place] and his next available apt is not until the first of Sept. Connor Small ask that I send Connor Small a message and he can return the call when he returns.  Connor Small was in the hospital for 3 1/2 months in critical care and was taken care of by Connor Small. Connor Small is wanting Connor Small to see him first thing when he returns. There return phone number is [phone].

## 2012-07-10 NOTE — Telephone Encounter (Signed)
Forwarded to [employer] as a reminder.

## 2012-07-10 NOTE — Telephone Encounter (Signed)
Patient returned call, spouse states she will go ahead and have thoracentesis done under [facility] per Dr. Agustina Caroli recs and will follow up with Dr. Lamonte Sakai after procedure. Patient has been taken off schedule for today and orders have been placed. Spouse aware to call after procedure to schedule follow up.

## 2012-07-11 ENCOUNTER — Ambulatory Visit (HOSPITAL_COMMUNITY)
Admission: RE | Admit: 2012-07-11 | Discharge: 2012-07-11 | Disposition: A | Discharge status: Home / Self Care | Payer: BC Managed Care – PPO | Source: Ambulatory Visit | Attending: Radiology | Admitting: Radiology

## 2012-07-11 ENCOUNTER — Ambulatory Visit (HOSPITAL_COMMUNITY)
Admission: RE | Admit: 2012-07-11 | Discharge: 2012-07-11 | Disposition: A | Discharge status: Home / Self Care | Payer: BC Managed Care – PPO | Source: Ambulatory Visit | Attending: Emergency Medicine | Admitting: Emergency Medicine

## 2012-07-11 DIAGNOSIS — R05 Cough: Secondary | ICD-10-CM | POA: Insufficient documentation

## 2012-07-11 DIAGNOSIS — R031 Nonspecific low blood-pressure reading: Secondary | ICD-10-CM | POA: Insufficient documentation

## 2012-07-11 DIAGNOSIS — J90 Pleural effusion, not elsewhere classified: Secondary | ICD-10-CM | POA: Insufficient documentation

## 2012-07-11 DIAGNOSIS — R059 Cough, unspecified: Secondary | ICD-10-CM | POA: Insufficient documentation

## 2012-07-11 NOTE — Procedures (Signed)
Successful US guided left thoracentesis. Yielded 1.7L of clear yellow fluid, then stopped due to pt cough and BP trend down to 80/48 Pt tolerated procedure well. No immediate complications.  Specimen was sent for labs. CXR ordered.  Ascencion Dike PA-C 07/11/2012 1:43 PM

## 2012-07-11 NOTE — Telephone Encounter (Signed)
Apt has been scheduled for 08/01/12 at 9:30 am with Dr. Laural Golden. Called wife to advised apt date and time. Voices understood.

## 2012-07-12 ENCOUNTER — Telehealth: Payer: Self-pay | Admitting: Emergency Medicine

## 2012-07-12 NOTE — Telephone Encounter (Signed)
Spoke to pt's wife. He is better post thora. I told them that cytology pending, that its not clear to me why it has returned but that if it comes back he may need pleurodesis.   Need to f/u in 3 months w a CXR.

## 2012-07-12 NOTE — Telephone Encounter (Signed)
Dr. Lamonte Sakai when is pt suppose to f.u. Please advise nothing available thanks

## 2012-07-13 ENCOUNTER — Ambulatory Visit (INDEPENDENT_AMBULATORY_CARE_PROVIDER_SITE_OTHER): Payer: BC Managed Care – PPO | Admitting: General Surgery

## 2012-07-13 ENCOUNTER — Encounter (INDEPENDENT_AMBULATORY_CARE_PROVIDER_SITE_OTHER): Payer: Self-pay | Admitting: General Surgery

## 2012-07-13 VITALS — BP 90/60 | HR 72 | Temp 98.0°F | Resp 16 | Ht 70.0 in | Wt 129.0 lb

## 2012-07-13 DIAGNOSIS — Z09 Encounter for follow-up examination after completed treatment for conditions other than malignant neoplasm: Secondary | ICD-10-CM

## 2012-07-13 NOTE — Telephone Encounter (Signed)
Connor Small is aware RB schedule is not out and will put reminder in EPIC. Nothing further was needed

## 2012-07-13 NOTE — Progress Notes (Signed)
Chief complaint: Followup abdominal surgery  History: Patient returns for his first visit to the office following an extensive hospitalization. He has a history of recent stroke and then developed severe C. Difficile colitis. At [facility] [place] he underwent initially placement of a cecostomy tube and then subsequent total abdominal colectomy with ileostomy. He was transferred to [place] for further care. In the early postoperative period he developed wound dehiscence and evisceration which was treated with an abdominal wound VAC and gradual closure of his abdominal wall over about a week. He was hospitalized for a couple of months and was in rehabilitation for about one month and now is back home. He and his wife reports his main problems are fatigue and weakness in some difficulty maintaining adequate oral intake. He does have a gastrostomy tube in place and get some supplemental tube feeding but this seems to cause nausea and vomiting and they're trying let him eat on his own. His appetite reportedly is gradually improving. He has ever lost about 5 pounds over the last several weeks. No problems from his abdominal wound. The ileostomy has retracted slightly causing some difficulty with appliances but they are managing this.  Exam: BP 90/60  Pulse 72  Temp(Src) 98 F (36.7 C) (Temporal)  Resp 16  Ht 5' 10"  (1.778 m)  Wt 129 lb (58.514 kg)  BMI 18.51 kg/m2 A thin somewhat chronically ill-appearing white male Abdomen: Midline wound is essentially well healed with just a few areas of dry scab. No drainage. No apparent hernias. Abdomen is soft and nontender. Ileostomy appears slightly retracted but healthy.  Assessment and plan: Status post emergency total colectomy and ileostomy and subsequent wound dehiscence. He is making a slow steady recovery. His appetite clearly seems to be improving by history. We will get a nutrition consult to try to help find ways to get some additional protein  intake. His weight needs to be monitored carefully and his wife can do this at home. She is urged to call if she notices any significant weight drop. I will see him back in one month.

## 2012-07-17 ENCOUNTER — Other Ambulatory Visit (INDEPENDENT_AMBULATORY_CARE_PROVIDER_SITE_OTHER): Payer: Self-pay

## 2012-07-17 DIAGNOSIS — E46 Unspecified protein-calorie malnutrition: Secondary | ICD-10-CM

## 2012-07-23 ENCOUNTER — Telehealth: Payer: Self-pay | Admitting: *Deleted

## 2012-07-23 ENCOUNTER — Ambulatory Visit (HOSPITAL_COMMUNITY)
Admission: RE | Admit: 2012-07-23 | Discharge: 2012-07-23 | Disposition: A | Discharge status: Home / Self Care | Payer: BC Managed Care – PPO | Source: Ambulatory Visit | Attending: Physical Medicine & Rehabilitation | Admitting: Physical Medicine & Rehabilitation

## 2012-07-23 DIAGNOSIS — IMO0001 Reserved for inherently not codable concepts without codable children: Secondary | ICD-10-CM | POA: Insufficient documentation

## 2012-07-23 DIAGNOSIS — I639 Cerebral infarction, unspecified: Secondary | ICD-10-CM

## 2012-07-23 DIAGNOSIS — R269 Unspecified abnormalities of gait and mobility: Secondary | ICD-10-CM | POA: Insufficient documentation

## 2012-07-23 DIAGNOSIS — I69998 Other sequelae following unspecified cerebrovascular disease: Secondary | ICD-10-CM | POA: Insufficient documentation

## 2012-07-23 DIAGNOSIS — M6281 Muscle weakness (generalized): Secondary | ICD-10-CM | POA: Insufficient documentation

## 2012-07-23 NOTE — Evaluation (Signed)
Occupational Therapy Evaluation  Patient Details  Name: Connor Small MRN: [id number] Date of Birth: [birth year]  Today's Date: 07/23/2012 Time: 0865-7846 OT Time Calculation (min): 45 min OT Evaluaton 45' Visit#: 1 of 36  Re-eval: 08/20/12  Assessment Diagnosis: CVA and critical illness myelopathy Prior Therapy: CIR and HH  Past Medical History:  Past Medical History  Diagnosis Date  . Chronic diarrhea   . Rectal bleed   . Hemorrhoids   . Ulcerative colitis     Distal UC over 8 yrs ago diagnosed  . Diverticulitis of large intestine with perforation 10/2011    done at [facility]  . 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 history of UC (ulcerative colitis) 12/06/2010   Past Surgical History:  Past Surgical History  Procedure Laterality Date  . Colonoscopy  9/03  . Sigmoidoscopy       06/13/2002  . Temporary ostomy November 06, 2010      for a colon perforation that was done in [facility] (Dr Ruben Im).    . Colonoscopy  02/16/2011    Procedure: COLONOSCOPY;  Surgeon: Malissa Hippo, MD;  [facility];  Service: Endoscopy;  Laterality: N/A;  100  . Flexible sigmoidoscopy  02/27/2012    Procedure: FLEXIBLE SIGMOIDOSCOPY;  Surgeon: Malissa Hippo, MD;  [facility];  Service: Endoscopy;  Laterality: N/A;  with colonic decompression  . Laparotomy  02/27/2012    Procedure: EXPLORATORY LAPAROTOMY;  Surgeon: Fabio Bering, MD;  Location: [facility];  Service: General;  Laterality: N/A;  . Cecostomy  02/27/2012    Procedure: CECOSTOMY;  Surgeon: Fabio Bering, MD;  Location: [facility];  Service: General;  Laterality: N/A;  Cecostomy Tube Placement  . Colectomy  02/28/2012    Procedure: TOTAL COLECTOMY;  Surgeon: Fabio Bering, MD;  Location: [facility];  Service: General;  Laterality: N/A;  . Ileostomy  02/28/2012    Procedure: ILEOSTOMY;  Surgeon: Fabio Bering, MD;  Location: [facility];  Service: General;  Laterality: N/A;  . Cystoscopy w/ ureteral stent placement Bilateral 03/20/2012     Procedure: CYSTOSCOPY WITH RETROGRADE PYELOGRAM/URETERAL STENT PLACEMENT;  Surgeon: Sebastian Ache, MD;  Location: [facility] OR;  Service: Urology;  Laterality: Bilateral;  . Laparotomy N/A 03/26/2012    Procedure: EXPLORATORY LAPAROTOMY;  Surgeon: Mariella Saa, MD;  Location: [facility] OR;  Service: General;  Laterality: N/A;  . Application of wound vac N/A 03/26/2012    Procedure: APPLICATION OF WOUND VAC;  Surgeon: Mariella Saa, MD;  Location: [facility] OR;  Service: General;  Laterality: N/A;  . Liver biopsy N/A 03/26/2012    Procedure: LIVER BIOPSY;  Surgeon: Mariella Saa, MD;  [facility]: [facility];  Service: General;  Laterality: N/A;  . Laparotomy N/A 03/29/2012    Procedure: EXPLORATORY LAPAROTOMY, PARTIAL WOUND CLOSURE;  Surgeon: Mariella Saa, MD;  Location: [facility];  Service: General;  Laterality: N/A;  . Vacuum assisted closure change N/A 03/29/2012    Procedure: Open ABDOMINAL VACUUM  CHANGE;  Surgeon: Mariella Saa, MD;  [facility]: [facility];  Service: General;  Laterality: N/A;  . Vacuum assisted closure change N/A 04/01/2012    Procedure: removal of abdominal vac dressing and abdominal closure;  Surgeon: Mariella Saa, MD;  [facility]: [facility] OR;  Service: General;  Laterality: N/A;    Subjective Symptoms/Limitations Symptoms: S:  This all began in February.  I really want to get full use of my hand back.  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  He recieved therapy as an inpatient from Feb-May, and has been recieving home health PT and OT through last week.  He has been referred to outpatient OT for evaluation and treatment.   Patient Stated Goals: I want to get use of my right hand back and go back to work.   Pain Assessment Currently in Pain?: Yes  Precautions/Restrictions  Precautions Precautions: Fall Restrictions Weight Bearing Restrictions: No  Balance  Screening Balance Screen Has the patient fallen in the past 6 months: No Has the patient had a decrease in activity level because of a fear of falling? : Yes Is the patient reluctant to leave their home because of a fear of falling? 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  Unable to use RUE for more than gross assist with any ADLs Dominant Hand: Right Vision - History Baseline Vision: No visual deficits  Cognition/Observation Cognition Overall Cognitive Status: Within Functional Limits for tasks assessed Observation/Other Assessments Observations: extension contracture of thumb, index, long digits  Sensation/Coordination/Edema Sensation Light Touch: Appears Intact Coordination Gross Motor Movements are Fluid and Coordinated: No Fine Motor Movements are Fluid and Coordinated: No Coordination and Movement Description: unable to assess FMC at this time  Additional Assessments RUE AROM (degrees) RUE Overall AROM Comments: assessed in seated Right Shoulder Flexion: 90 Degrees Right Shoulder ABduction: 80 Degrees Right Shoulder Internal Rotation: 80 Degrees Right Shoulder External Rotation: 55 Degrees Right Elbow Flexion: 140 Right Elbow Extension:  (lacks 30 degrees from neutral) Right Forearm  Pronation: 85 Degrees Right Forearm Supination: 62 Degrees Right Wrist Extension: 6 Degrees Right Wrist Flexion: 30 Degrees Right Composite Finger Extension:  (25% in small and ring finger) Right Composite Finger Flexion:  (25% in small and ring finger) RUE PROM (degrees) RUE Overall PROM Comments: 50% with hard end feel RUE Strength RUE Overall Strength Comments: RUE strength not assessed, grip right 12 pounds. left 35 pounds Palpation Palpation: extension contractures present in digits     Exercise/Treatments    Splinting Splinting: issued prefab right resting hand splint to be worn at night, positioning right hand in functional position and educated patient and his wife on donning and doffing of splint.   Occupational Therapy Assessment and Plan OT Assessment and Plan Clinical Impression Statement: A:  Patient presents with RUE weakness, contractures of hand, decreased AROM, coordination, and strength s/p CVA and critical illness myopathy.  Deficits are causing decreased functional independence with all daily activities, use of dominant right arm/hand with functional activities.   Pt will benefit from skilled therapeutic intervention in order to improve on the following deficits: Decreased activity tolerance;Decreased coordination;Decreased range of motion;Decreased strength;Increased muscle spasms;Impaired flexibility;Impaired UE functional use Rehab Potential: Good OT Frequency: Min 3X/week OT Duration: 12 weeks;8 weeks OT Treatment/Interventions: Self-care/ADL training;Therapeutic exercise;Neuromuscular education;Splinting;Modalities;Manual therapy;Therapeutic activities;Patient/family education OT Plan: P:  Skilled OT intervention is indicated to improve RUE function and patients functional level of I with all B/IADLs, work, and leisure activities.  Treatment focus on weightbearing, dev of reach, functional hand movements, facilitation of movement in RUE, ADL training.      Goals Home Exercise Program Pt/caregiver will Perform Home Exercise Program: with written HEP provided;independently PT Goal: Perform Home Exercise Program - Progress: Goal set today Short Term Goals Time to Complete Short Term Goals: 6 weeks Short Term Goal 1: Patient will be educated on a HEP. Short Term Goal 2: Patient will weightbear on his RUE while completing ADLs with min pa. Short Term Goal 3: Patient will complete shower transfer with tub transfer bench with SBA. Short Term Goal 4: Patient will don and doff pants with reacher with SBA for safety. Short Term Goal 5: Patient will improve RUE and hand AROM by 25% for increased ability to use RUE as an active assist with daily activities.  Additional Short Term Goals?: Yes Short Term Goal 6: Patient will improve right grip strength by 10 pounds for increased ability to open containers.  Short Term Goal 7: Patient will complete pinch testing and coordination testing.  Long Term Goals Long Term Goal 1: Patient will complete all B/IADLs, work, and leisure activities at the highest level of indepdendence possible, using his right hand as dominant.  Long Term Goal 2: Patient will have WFL AROM in his right arm and hand for increased use with all functional activities.  Long Term Goal 3: Patient will have 25# or more grip strength and 10# or more pinch strength in his right hand for increased ability to complete woodworking and metal working [employer].  Long Term Goal 4: Patient will improve fine motor coordination to be able to manipulate his ostomy and ostomy supplies independently.  Long Term Goal 5: Patient will have WFL strength in his right arm for increased ability to complete household tasks.   Problem List Patient Active Problem List   Diagnosis Date Noted  . Allergic rhinitis 06/28/2012  . Aspiration pneumonitis 04/26/2012  . Clostridium difficile colitis 04/26/2012  . Intra-abdominal fluid collection/LUQ 04/26/2012  . CKD  (chronic kidney disease) stage 2, GFR 60-89 ml/min 04/26/2012  . Acute renal failure 04/26/2012  . Hypokalemia 04/26/2012  . Protein-calorie malnutrition, severe 04/25/2012  . Spleen hematoma - subcapsular & without rupture  04/25/2012  . Ascites s/p US guide paracentesis (4/2) 04/25/2012  . Ileostomy in place 04/24/2012  . HAP (hospital-acquired pneumonia) 04/21/2012  . 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 Acute blood loss anemia 04/18/2012  . S/p total colectomy for toxic megacolon, C diff 03/23/2012  . Hypernatremia, improving 03/23/2012  . Severe muscle deconditioning 03/23/2012  . Shoulder subluxation, right 03/23/2012  . 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 Coagulopathy 03/23/2012  . Quadriparesis 03/17/2012  . Leukocytosis, unspecified 03/15/2012  . Hyperbilirubinemia, due to cholestasis 03/15/2012  . PAF (paroxysmal atrial fibrillation) 03/15/2012  . Anemia of chronic disease 03/15/2012  . Pleural effusion, L > R 03/04/2012  . Sinus tachycardia 02/28/2012  . Hematuria 02/28/2012  . history of UC (ulcerative colitis) 12/06/2010    End of Session Activity Tolerance: Patient tolerated treatment well General Behavior During Therapy: [facility] for tasks assessed/performed OT Plan of Care OT Home Exercise Plan: CVA daily activities, towel slides, weightbearing. - handouts OT Patient Instructions: educated patient on wear and care of resting hand splint.   Consulted and Agree with Plan of Care: Patient  Shirlean Mylar, OTR/L  07/23/2012, 5:01 PM  Physician Documentation Your signature is required to indicate approval of the treatment plan as stated above.  Please sign and either send electronically or make a copy of this report for your files and return this physician signed original.  Please mark one 1.__approve of plan  2. ___approve of plan with the following conditions.   ______________________________                                                           _____________________ Physician Signature                                                                                                             Date

## 2012-07-23 NOTE — Evaluation (Signed)
Physical Therapy Evaluation  Patient Details  Name: Connor Small MRN: [id number] Date of Birth: 12/04/1960  Today's Date: 07/23/2012 Time: 1350-1430 PT Time Calculation (min): 40 min Charges: Evaluation: 1             Visit#: 1 of 12  Re-eval: 08/22/12 Assessment Diagnosis: CVA Surgical Date: 02/26/12 Next MD Visit: 08/20/12 Prior Therapy: inpatient and HHPT  Authorization:      Authorization Time Period:    Authorization Visit#:   of     Past Medical History:  Past Medical History  Diagnosis Date  . Chronic diarrhea   . Rectal bleed   . Hemorrhoids   . Ulcerative colitis     Distal UC over 8 yrs ago diagnosed  . Diverticulitis of large intestine with perforation 10/2011    done at [facility]  . 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 history of UC (ulcerative colitis) 12/06/2010   Past Surgical History:  Past Surgical History  Procedure Laterality Date  . Colonoscopy  9/03  . Sigmoidoscopy       06/13/2002  . Temporary ostomy November 06, 2010      for a colon perforation that was done in [facility] (Dr Ruben Im).    . Colonoscopy  02/16/2011    Procedure: COLONOSCOPY;  Surgeon: Malissa Hippo, MD;  [facility];  Service: Endoscopy;  Laterality: N/A;  100  . Flexible sigmoidoscopy  02/27/2012    Procedure: FLEXIBLE SIGMOIDOSCOPY;  Surgeon: Malissa Hippo, MD;  [facility];  Service: Endoscopy;  Laterality: N/A;  with colonic decompression  . Laparotomy  02/27/2012    Procedure: EXPLORATORY LAPAROTOMY;  Surgeon: Fabio Bering, MD;  Location: [facility];  Service: General;  Laterality: N/A;  . Cecostomy  02/27/2012    Procedure: CECOSTOMY;  Surgeon: Fabio Bering, MD;  Location: [facility];  Service: General;  Laterality: N/A;  Cecostomy Tube Placement  . Colectomy  02/28/2012    Procedure: TOTAL COLECTOMY;  Surgeon: Fabio Bering, MD;  Location: [facility];  Service: General;  Laterality: N/A;  . Ileostomy  02/28/2012    Procedure: ILEOSTOMY;  Surgeon: Fabio Bering,  MD;  Location: [facility];  Service: General;  Laterality: N/A;  . Cystoscopy w/ ureteral stent placement Bilateral 03/20/2012    Procedure: CYSTOSCOPY WITH RETROGRADE PYELOGRAM/URETERAL STENT PLACEMENT;  Surgeon: Sebastian Ache, MD;  Location: [facility] OR;  Service: Urology;  Laterality: Bilateral;  . Laparotomy N/A 03/26/2012    Procedure: EXPLORATORY LAPAROTOMY;  Surgeon: Mariella Saa, MD;  Location: [facility];  Service: General;  Laterality: N/A;  . Application of wound vac N/A 03/26/2012    Procedure: APPLICATION OF WOUND VAC;  Surgeon: Mariella Saa, MD;  Location: [facility] OR;  Service: General;  Laterality: N/A;  . Liver biopsy N/A 03/26/2012    Procedure: LIVER BIOPSY;  Surgeon: Mariella Saa, MD;  Location: [facility] OR;  Service: General;  Laterality: N/A;  . Laparotomy N/A 03/29/2012    Procedure: EXPLORATORY LAPAROTOMY, PARTIAL WOUND CLOSURE;  Surgeon: Mariella Saa, MD;  Location: [facility];  Service: General;  Laterality: N/A;  . Vacuum assisted closure change N/A 03/29/2012    Procedure: Open ABDOMINAL VACUUM  CHANGE;  Surgeon: Mariella Saa, MD;  Location: [facility];  Service: General;  Laterality: N/A;  . Vacuum assisted closure change N/A 04/01/2012    Procedure: removal of abdominal vac dressing and abdominal closure;  Surgeon: Mariella Saa, MD;  [facility]: [facility] OR;  Service: General;  Laterality: N/A;    Subjective Symptoms/Limitations Pertinent History: Pt is referred to PT for weakness s/p stroke on 02/28/12 after recieving ileostomy w/c-diff, w/subsequent pnemonia in april and was released on 5/14. He has had inpatient rehab and hhpt visits over this time. He requires 24/7 supervision and requires ostemy changing 6-8x a day.  He reports he is still very deconditioned and feels that he is getting better and his HR is staying in the 130's when walking  He is trying to walk more than 3x a day.  in a month time he has gone from slide board to walking with RW. His wife reports she is allowing  him to do as much as he can on his own, but is mostly limited by his activity tolerance.  His c/co is decreased activity tolerance, inability to return to work, decreased strength and decreased balance.  How long can you stand comfortably?: less than 1 minute How long can you walk comfortably?: 66 steps Pain Assessment Currently in Pain?: Yes  Precautions/Restrictions     Balance Screening Balance Screen Has the patient fallen in the past 6 months: No Has the patient had a decrease in activity level because of a fear of falling? : Yes Is the patient reluctant to leave their home because of a fear of falling? : No  Prior Functioning  Prior Function Vocation: Full time employment Vocation Requirements: [employer]  Cognition/Observation  Unable to go from sit to stand from lowered surface w/o UE assist  Sensation/Coordination/Flexibility/Functional Tests    Assessment RLE Strength RLE Overall Strength Comments: taken in seated position Right Hip Flexion: 3/5 Right Hip Extension: 3/5 Right Hip ABduction: 3/5 Right Hip ADduction: 3/5 Right Knee Flexion: 3+/5 Right Knee Extension: 3+/5 LLE Strength LLE Overall Strength Comments: taken in seated position Left Hip Flexion: 3/5 Left Hip Extension: 3/5 Left Hip ABduction: 3/5 Left Hip ADduction: 3/5 Left Knee Flexion: 3+/5 Left Knee Extension: 3+/5  Mobility/Balance  Ambulation/Gait Ambulation/Gait: Yes Ambulation/Gait Assistance: 4: Min assist Ambulation Distance (Feet): 132 Feet (in 2 minutes) Assistive device: Rolling walker Berg Balance Test Sit to Stand: Able to stand  independently using hands Standing Unsupported: Able to stand 2 minutes with supervision Sitting with Back Unsupported but Feet Supported on Floor or Stool: Able to sit safely and securely 2 minutes Stand to Sit: Sits safely with minimal use of hands Transfers: Able to transfer safely, definite need of hands Standing Unsupported with Eyes Closed:  Able to stand 10 seconds with supervision Standing Ubsupported with Feet Together: Able to place feet together independently and stand for 1 minute with supervision From Standing, Reach Forward with Outstretched Arm: Can reach forward >5 cm safely (2") From Standing Position, Pick up Object from Floor: Able to pick up shoe, needs supervision From Standing Position, Turn to Look Behind Over each Shoulder: Looks behind one side only/other side shows less weight shift Turn 360 Degrees: Able to turn 360 degrees safely but slowly Standing Unsupported, Alternately Place Feet on Step/Stool: Able to complete >2 steps/needs minimal assist Standing Unsupported, One Foot in Front: Able to take small step independently and hold 30 seconds Standing on One Leg: Able to lift leg independently and hold equal to or more than 3 seconds Total Score: 38     Physical Therapy Assessment and Plan PT Assessment and Plan Clinical Impression Statement: Pt is a 52 year old male referred to PT for gerenalized weakness following ceostomy and s/p CVA in February with multiple complications to follow with impairments  below.  At this time pt is somewhat limited in his lumbar AROM and hip flexion due to ostomey bag.  Requires 24/7 supervision from his wife.  he would like to be   Pt will benefit from skilled therapeutic intervention in order to improve on the following deficits: Cardiopulmonary status limiting activity;Decreased activity tolerance;Difficulty walking;Decreased strength;Decreased balance;Decreased mobility Rehab Potential: Good PT Frequency: Min 3X/week PT Duration: 4 weeks PT Treatment/Interventions: Gait training;Stair training;Functional mobility training;Therapeutic activities;Therapeutic exercise;Balance training;Neuromuscular re-education;Patient/family education;Manual techniques;Modalities PT Plan: Gait training to improve balance and independendence.  NuStep and/or TM for activity tolerance.  Review HEP.  Encourage standing activities.      Goals Home Exercise Program Pt/caregiver will Perform Home Exercise Program: with written HEP provided;independently PT Goal: Perform Home Exercise Program - Progress: Goal set today PT Short Term Goals Time to Complete Short Term Goals: 2 weeks PT Short Term Goal 1: Pt will improve his LE strength by one muscle grade in order to tolerate ambualting 5 minutes with LRAD.  PT Short Term Goal 2: Pt will improve his proprioception and demonstrate static standing balance and demonstrate tandem stance x15 seconds PT Short Term Goal 3: Pt will complete the DGI.  PT Long Term Goals Time to Complete Long Term Goals: 4 weeks PT Long Term Goal 1: Pt will improve his funcitonal strength and demonstrate sit to stand from standard surface without UE assistance.  PT Long Term Goal 2: Pt wil improve his activity tolerance in order to ambualte x10 minutes with RW for outdoor surfaces and SPC for indoor surface for improved QOL.  Long Term Goal 3: Pt will improve his Berg balance test to 50/56. Long Term Goal 4: Pt will score greater than a 18/24 on the DGI.   Problem List Patient Active Problem List   Diagnosis Date Noted  . Allergic rhinitis 06/28/2012  . Aspiration pneumonitis 04/26/2012  . Clostridium difficile colitis 04/26/2012  . Intra-abdominal fluid collection/LUQ 04/26/2012  . CKD (chronic kidney disease) stage 2, GFR 60-89 ml/min 04/26/2012  . Acute renal failure 04/26/2012  . Hypokalemia 04/26/2012  . Protein-calorie malnutrition, severe 04/25/2012  . Spleen hematoma - subcapsular & without rupture  04/25/2012  . Ascites s/p US guide paracentesis (4/2) 04/25/2012  . Ileostomy in place 04/24/2012  . HAP (hospital-acquired pneumonia) 04/21/2012  . 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 Acute blood loss anemia 04/18/2012  . S/p total colectomy for toxic megacolon, C diff 03/23/2012  . Hypernatremia, improving 03/23/2012  . Severe muscle  deconditioning 03/23/2012  . Shoulder subluxation, right 03/23/2012  . 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 Coagulopathy 03/23/2012  . Quadriparesis 03/17/2012  . Leukocytosis, unspecified 03/15/2012  . Hyperbilirubinemia, due to cholestasis 03/15/2012  . PAF (paroxysmal atrial fibrillation) 03/15/2012  . Anemia of chronic disease 03/15/2012  . Pleural effusion, L > R 03/04/2012  . Sinus tachycardia 02/28/2012  . Hematuria 02/28/2012  . history of UC (ulcerative colitis) 12/06/2010    PT - End of Session Equipment Utilized During Treatment: Gait belt General Behavior During Therapy: [employer] for tasks assessed/performed PT Plan of Care PT Home Exercise Plan: see scanned report PT Patient Instructions: importance of HEP, continue with gait belt at home.  answered questions about diagnosis and POC Consulted and Agree with Plan of Care: Patient;Family member/caregiver Family Member Consulted: wife [name])  GP    Jasmina Gendron, MPT, ATC 07/23/2012, 4:07 PM  Physician Documentation Your signature is required to indicate approval of the treatment plan as stated  above.  Please sign and either send electronically or make a copy of this report for your files and return this physician signed original.   Please mark one 1.__approve of plan  2. ___approve of plan with the following conditions.   ______________________________                                                          _____________________ Physician Signature                                                                                                             Date

## 2012-07-23 NOTE — Telephone Encounter (Signed)
Mr Aburto is there for therapy with no orders.  Referrals placed

## 2012-07-25 ENCOUNTER — Ambulatory Visit (HOSPITAL_COMMUNITY)
Admission: RE | Admit: 2012-07-25 | Discharge: 2012-07-25 | Disposition: A | Discharge status: Home / Self Care | Payer: BC Managed Care – PPO | Source: Ambulatory Visit | Attending: Physical Medicine & Rehabilitation | Admitting: Physical Medicine & Rehabilitation

## 2012-07-25 DIAGNOSIS — R269 Unspecified abnormalities of gait and mobility: Secondary | ICD-10-CM | POA: Insufficient documentation

## 2012-07-25 DIAGNOSIS — M6281 Muscle weakness (generalized): Secondary | ICD-10-CM | POA: Insufficient documentation

## 2012-07-25 DIAGNOSIS — IMO0001 Reserved for inherently not codable concepts without codable children: Secondary | ICD-10-CM | POA: Insufficient documentation

## 2012-07-25 DIAGNOSIS — I69998 Other sequelae following unspecified cerebrovascular disease: Secondary | ICD-10-CM | POA: Insufficient documentation

## 2012-07-25 NOTE — Progress Notes (Signed)
Occupational Therapy Treatment Patient Details  Name: Connor Small MRN: [id number] Date of Birth: November 23, 1960  Today's Date: 07/25/2012 Time: 7564-3329 OT Time Calculation (min): 41 min NM re-ed 5188-4166 41'  Visit#: 2 of 36  Re-eval: 08/20/12    Authorization: n/a  Authorization Time Period:    Authorization Visit#:   of    Subjective Symptoms/Limitations Symptoms: S; My hand doesn't move that much. It has come a long way though.  Pain Assessment Currently in Pain?: No/denies  Precautions/Restrictions  Precautions Precautions: Fall  Exercise/Treatments Neurological Re-education Exercises Elbow Flexion: PROM;AROM;10 reps Elbow Extension: PROM;AROM;10 reps Forearm Supination: PROM;10 reps Forearm Pronation: PROM;10 reps Wrist Flexion: PROM;AROM;10 reps Wrist Extension: PROM;AROM;10 reps Wrist Radial Deviation: PROM;10 reps Wrist Ulnar Deviation: PROM;10 reps  Weight Bearing Exercises Weight Bearing Position: Standing Standing with weight shifting on and off: at table with closed fist and with open hand. Approx. 2 minutes before requiring a seated rest break. 2 trails.  Development of Reach  Development of Reach: [employer] Crate; Right: While standing, patient used Right hand to grasp [place] balls with right hand from left side of table and released in crate on right side of table. Session with focus on grasp and release of right hand while weightshifting when standing. All balls moved from left to right and right to left.   Grasp and Release Grasp and Release: Thumb Opposition Thumb Opposition: thumb to each digit; 1 time each        Manual Therapy Manual Therapy: Joint mobilization Joint Mobilization: Joint mobilizaton to RUE hand, wrist, and elbows to increase joint mobility during daily tasks.   Occupational Therapy Assessment and Plan OT Assessment and Plan Clinical Impression Statement: A: Slight pain during manual stretching of RUE. Patient had limited  joint mobility of right hand due to contractures. Patient very moviated and positive during tx session. Focused on low-load, prolonged stretches versus quick intense stretching to gain the most joint mobility during manual stretching.  OT Plan: P: Work on increasing PROM of RUE hand by decreasing contracture. Attempt deep heating with modalities such as ultrasound, electrical stimulation, joint mobilization and passive stretching.    Goals Short Term Goals Time to Complete Short Term Goals: 6 weeks Short Term Goal 1: Patient will be educated on a HEP. Short Term Goal 1 Progress: Progressing toward goal Short Term Goal 2: Patient will weightbear on his RUE while completing ADLs with min pa. Short Term Goal 2 Progress: Progressing toward goal Short Term Goal 3: Patient will complete shower transfer with tub transfer bench with SBA. Short Term Goal 3 Progress: Progressing toward goal Short Term Goal 4: Patient will don and doff pants with reacher with SBA for safety. Short Term Goal 4 Progress: Progressing toward goal Short Term Goal 5: Patient will improve RUE and hand AROM by 25% for increased ability to use RUE as an active assist with daily activities.  Short Term Goal 5 Progress: Progressing toward goal Additional Short Term Goals?: Yes Short Term Goal 6: Patient will improve right grip strength by 10 pounds for increased ability to open containers.  Short Term Goal 6 Progress: Progressing toward goal Short Term Goal 7: Patient will complete pinch testing and coordination testing.  Short Term Goal 7 Progress: Progressing toward goal Long Term Goals Long Term Goal 1: Patient will complete all B/IADLs, work, and leisure activities at the highest level of indepdendence possible, using his right hand as dominant.  Long Term Goal 1 Progress: Progressing toward goal Long Term  Goal 2: Patient will have WFL AROM in his right arm and hand for increased use with all functional activities.  Long Term  Goal 2 Progress: Progressing toward goal Long Term Goal 3: Patient will have 25# or more grip strength and 10# or more pinch strength in his right hand for increased ability to complete woodworking and metal working [employer].  Long Term Goal 3 Progress: Progressing toward goal Long Term Goal 4: Patient will improve fine motor coordination to be able to manipulate his ostomy and ostomy supplies independently.  Long Term Goal 4 Progress: Progressing toward goal Long Term Goal 5: Patient will have WFL strength in his right arm for increased ability to complete household tasks.  Long Term Goal 5 Progress: Progressing toward goal  Problem List Patient Active Problem List   Diagnosis Date Noted  . Allergic rhinitis 06/28/2012  . Aspiration pneumonitis 04/26/2012  . Clostridium difficile colitis 04/26/2012  . Intra-abdominal fluid collection/LUQ 04/26/2012  . CKD (chronic kidney disease) stage 2, GFR 60-89 ml/min 04/26/2012  . Acute renal failure 04/26/2012  . Hypokalemia 04/26/2012  . Protein-calorie malnutrition, severe 04/25/2012  . Spleen hematoma - subcapsular & without rupture  04/25/2012  . Ascites s/p US guide paracentesis (4/2) 04/25/2012  . Ileostomy in place 04/24/2012  . HAP (hospital-acquired pneumonia) 04/21/2012  . 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 Acute blood loss anemia 04/18/2012  . S/p total colectomy for toxic megacolon, C diff 03/23/2012  . Hypernatremia, improving 03/23/2012  . Severe muscle deconditioning 03/23/2012  . Shoulder subluxation, right 03/23/2012  . 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 Coagulopathy 03/23/2012  . Quadriparesis 03/17/2012  . Leukocytosis, unspecified 03/15/2012  . Hyperbilirubinemia, due to cholestasis 03/15/2012  . PAF (paroxysmal atrial fibrillation) 03/15/2012  . Anemia of chronic disease 03/15/2012  . Pleural effusion, L > R 03/04/2012  . Sinus tachycardia 02/28/2012  . Hematuria  02/28/2012  . history of UC (ulcerative colitis) 12/06/2010    End of Session Activity Tolerance: Patient tolerated treatment well General Behavior During Therapy: [facility] for tasks assessed/performed  Limmie Patricia, OTR/L,CBIS   07/25/2012, 3:04 PM

## 2012-07-25 NOTE — Progress Notes (Signed)
Physical Therapy Treatment Patient Details  Name: Connor Small MRN: [id number] Date of Birth: [birth year]  Today's Date: 07/25/2012 Time: 1117-3567 PT Time Calculation (min): 38 min Charge NMR 38' 1437-1515  Visit#: 2 of 12  Re-eval: 08/22/12  Subjective: Symptoms/Limitations Symptoms: Pt reorted he was tired today, has been completeing the HEP exercises for his HHPT and his OT. Pain Assessment Currently in Pain?: No/denies  Precautions/Restrictions  Precautions Precautions: Fall  Exercise/Treatments Balance Exercises Standing Standing Eyes Opened: Narrow base of support (BOS);Limitations Standing Eyes Opened Limitations: pertubation x 1 minutes Tandem Stance: Eyes open;2 reps;30 secs Tandem Gait: Forward;1 rep Retro Gait: 1 rep Sidestepping: 1 rep Heel Raises: 10 reps Toe Raise: 10 reps Sit to Stand: Standard surface;Limitations Sit to Stand Limitations: 5 STS no HHA Other Standing Exercises: Rockerboard 2' R/L   Physical Therapy Assessment and Plan PT Assessment and Plan Clinical Impression Statement: Began high level balance activities to improve gait mechanice with min assistance for LOB episodes and vc-ing to improve spatial awareness.  Pt with decreased activity tolerance required multiple rest breaks.   PT Plan: Gait training to improve balance and independendence.  NuStep and/or TM for activity tolerance.  Review HEP. Encourage standing activities.      Goals    Problem List Patient Active Problem List   Diagnosis Date Noted  . Allergic rhinitis 06/28/2012  . Aspiration pneumonitis 04/26/2012  . Clostridium difficile colitis 04/26/2012  . Intra-abdominal fluid collection/LUQ 04/26/2012  . CKD (chronic kidney disease) stage 2, GFR 60-89 ml/min 04/26/2012  . Acute renal failure 04/26/2012  . Hypokalemia 04/26/2012  . Protein-calorie malnutrition, severe 04/25/2012  . Spleen hematoma - subcapsular & without rupture  04/25/2012  . Ascites s/p US guide  paracentesis (4/2) 04/25/2012  . Ileostomy in place 04/24/2012  . HAP (hospital-acquired pneumonia) 04/21/2012  . 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 Acute blood loss anemia 04/18/2012  . S/p total colectomy for toxic megacolon, C diff 03/23/2012  . Hypernatremia, improving 03/23/2012  . Severe muscle deconditioning 03/23/2012  . Shoulder subluxation, right 03/23/2012  . 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 Coagulopathy 03/23/2012  . Quadriparesis 03/17/2012  . Leukocytosis, unspecified 03/15/2012  . Hyperbilirubinemia, due to cholestasis 03/15/2012  . PAF (paroxysmal atrial fibrillation) 03/15/2012  . Anemia of chronic disease 03/15/2012  . Pleural effusion, L > R 03/04/2012  . Sinus tachycardia 02/28/2012  . Hematuria 02/28/2012  . history of UC (ulcerative colitis) 12/06/2010    PT - End of Session Equipment Utilized During Treatment: Gait belt Activity Tolerance: Patient tolerated treatment well General Behavior During Therapy: [facility] for tasks assessed/performed  GP    Aldona Lento 07/25/2012, 5:05 PM

## 2012-07-30 ENCOUNTER — Encounter: Payer: Self-pay | Admitting: Neurology

## 2012-07-30 ENCOUNTER — Ambulatory Visit (INDEPENDENT_AMBULATORY_CARE_PROVIDER_SITE_OTHER): Payer: BC Managed Care – PPO | Admitting: Neurology

## 2012-07-30 ENCOUNTER — Telehealth (HOSPITAL_COMMUNITY): Payer: Self-pay

## 2012-07-30 ENCOUNTER — Ambulatory Visit (HOSPITAL_COMMUNITY): Payer: BC Managed Care – PPO | Admitting: Specialist

## 2012-07-30 VITALS — BP 106/74 | HR 104 | Temp 98.7°F | Ht 70.0 in | Wt 140.0 lb

## 2012-07-30 DIAGNOSIS — I6350 Cerebral infarction due to unspecified occlusion or stenosis of unspecified cerebral artery: Secondary | ICD-10-CM

## 2012-07-30 DIAGNOSIS — G7281 Critical illness myopathy: Secondary | ICD-10-CM

## 2012-07-30 DIAGNOSIS — I4891 Unspecified atrial fibrillation: Secondary | ICD-10-CM

## 2012-07-30 NOTE — Progress Notes (Signed)
[facility] [address] [place]. [place] [address] [phone]       OFFICE FOLLOW-UP NOTE  [name] Date of Birth:  [birth year] Medical Record Number:  [id number]   HPI: 32 year patient with bilateral cerebral and right cerebellar infarcts of embolic etiology in setting of sepsis and paroxysmal atrial fibrillation in March 2014.. h/o ulcerative colitis and diverticulitis s/p ileostomy He is seen today for f/u after hospital consult for stroke  In March 2014.he was found to have altered mental status and generalized weakness especially right hand  In setting of sepsis following diverticulitis and infection.he also had transient atrial fibrillation but due to low platelets and abnormal LFTs he was not started on anticoagulation but aspirin 81 mg instead. MRI showed bilateral small acute and subacute bicerebral and right cerebellar infarcts.MRA showed no large vessel occlusion.MRI C spine ws recommended given asymmetric right hand weakness but not done due to his sickness. He was also felt to have component of critical illness neuropathy/myopathy. He has obtained gradual improvement in his stamina and strength with rehab and therapy but still has significant residual right hand weakness.he  Denies numbnesss, tingling, myalgias. He can walk short distances unassisted but uses walker for long distances. ROS:   14 system review of systems is positive for weight loss, cough,weakness,changes in apetite,urination,decrease stamina, fatiguability  PMH:  Past Medical History  Diagnosis Date  . Chronic diarrhea   . Rectal bleed   . Hemorrhoids   . Ulcerative colitis     Distal UC over 8 yrs ago diagnosed  . Diverticulitis of large intestine with perforation 10/2011    done at [facility]  . 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 history of UC (ulcerative colitis) 12/06/2010    Social History:  History   Social History  . Marital Status: Married    Spouse Name: [name]    Number  of Children: 1  . 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 Smoking status: Former Smoker -- 0.50 packs/day for 12 years    Types: Cigarettes    Quit date: 07/17/1984  . Smokeless tobacco: Never Used     Comment: Quit 23 yrs ago  . Alcohol Use: No  . Drug Use: No  . Sexually Active: Yes   Other Topics Concern  . Not on file   Social History Narrative   Patient lives at home with his spouse.   Caffeine Use: rarely    Medications:   Current Outpatient Prescriptions on File Prior to Visit  Medication Sig Dispense Refill  . aspirin 81 MG chewable tablet Place 1 tablet (81 mg total) into feeding tube daily.      . clonazePAM (KLONOPIN) 0.5 MG tablet Take 1 tablet (0.5 mg total) by mouth at bedtime. For anxiety/sleep  30 tablet  1  . IRON PO Take 300 mg by mouth daily.      Marland Kitchen lidocaine (LIDODERM) 5 % Apply one patch to right shoulder at 6 am and remove at 6 pm daily.  30 patch  1  . megestrol (MEGACE ORAL) 40 MG/ML suspension Take 10 mLs (400 mg total) by mouth 2 (two) times daily.  300 mL  4  . metoprolol tartrate (LOPRESSOR) 25 mg/10 mL SUSP Place 2.5 mLs (6.25 mg total) into feeding tube daily. For blood pressure and heart  150 mL  1  . Multiple Vitamin (MULTIVITAMIN) LIQD Take 5 mLs by mouth  daily.      . prochlorperazine (COMPAZINE) 10 MG tablet Take 1 tablet (10 mg total) by mouth every 6 (six) hours as needed.  60 tablet  1  . traMADol (ULTRAM) 50 MG tablet Place 1 tablet (50 mg total) into feeding tube every 6 (six) hours as needed. For pain  60 tablet  1   No current facility-administered medications on file prior to visit.    Allergies:   Allergies  Allergen Reactions  . Penicillins Other (See Comments)    Heart rate changes  . Claritin (Loratadine)     Rash  . Morphine And Related     Nausea/vomiting  . Oxycodone     Nausea/vomiting  . Imipenem Rash   Filed Vitals:   07/30/12 1406  BP: 106/74   Pulse: 104  Temp: 98.7 F (37.1 C)    Physical Exam General: frail cachectic middle aged male, seated, in no evident distress Head: head normocephalic and atraumatic. Orohparynx benign Neck: supple with no carotid or supraclavicular bruits Cardiovascular: regular rate and rhythm, no murmurs Musculoskeletal: no deformity Skin:  no rash/petichiae Vascular:  Normal pulses all extremities  Neurologic Exam Mental Status: Awake and fully alert. Oriented to place and time. Recent and remote memory intact. Attention span, concentration and fund of knowledge appropriate. Mood and affect appropriate.  Cranial Nerves: Fundoscopic exam reveals sharp disc margins. Pupils equal, briskly reactive to light. Extraocular movements full without nystagmus. Visual fields full to confrontation. Hearing intact. Facial sensation intact. Face, tongue, palate moves normally and symmetrically.  Motor: mild wasting both hands. Weakness right grip and intrinsic hand muscles. Minimal weakness left hand intrinsic muscles and wasting.                                                             nomal LE strength. Sensory : no deficitsd pinprick and vibratory.  Coordination: Rapid alternating movements normal in all extremities. Finger-to-nose and heel-to-shin performed accurately bilaterally. Gait and Station: Arises from chair without difficulty. Stance is broad based. Gait demonstrates normal stride length and balance . Not able to heel, toe and tandem walk without difficulty. Uses walker for long distances Reflexes: 1+ and symmetric. Toes downgoing.     ASSESSMENT: 30 year patient with bilateral cerebral and right cerebellar infarcts of embolic etiology in setting of sepsis and paroxysmal atrial fibrillation in March 2014.. h/o ulcerative colitis and diverticulitis s/p ileostomy    PLAN: Continue aspirin for stroke prevention as atrial fibrillation wa slikley a solitary event related to  sepsis and his CHAD2VASc score does not justify anticoagulation especially as he has low platelets and abnormal LFTs.Check EMG/NCV for crtiical illness myopathy/neuropathy.Continye therapies. Follow up in 3 months with Larita Fife, NP

## 2012-07-30 NOTE — Patient Instructions (Signed)
Patient was advised to stay on aspirin 81 mg daily for stroke prevention as I feel patient's atrial fibrillation was solitary isolated event related to his sepsis and critical illness. His CHAD2vasc score is not high enough to justify anticoagulation which would be risky given his low platelet count and hematuria. Continue outpatient physical and occupational therapy. Check nerve conduction/EMG study to look for any underlying critical illness neuropathy/myopathy. Return for followup in 3 months with Charlott Holler, NP.

## 2012-08-01 ENCOUNTER — Telehealth (HOSPITAL_COMMUNITY): Payer: Self-pay

## 2012-08-01 ENCOUNTER — Ambulatory Visit (HOSPITAL_COMMUNITY)
Admission: RE | Admit: 2012-08-01 | Discharge: 2012-08-01 | Disposition: A | Discharge status: Home / Self Care | Payer: BC Managed Care – PPO | Source: Ambulatory Visit | Attending: Internal Medicine | Admitting: Internal Medicine

## 2012-08-01 ENCOUNTER — Ambulatory Visit (INDEPENDENT_AMBULATORY_CARE_PROVIDER_SITE_OTHER): Payer: BC Managed Care – PPO | Admitting: Internal Medicine

## 2012-08-01 ENCOUNTER — Encounter (INDEPENDENT_AMBULATORY_CARE_PROVIDER_SITE_OTHER): Payer: Self-pay | Admitting: Internal Medicine

## 2012-08-01 VITALS — BP 108/72 | HR 78 | Temp 98.3°F | Resp 18 | Ht 70.0 in | Wt 139.9 lb

## 2012-08-01 DIAGNOSIS — N1330 Unspecified hydronephrosis: Secondary | ICD-10-CM | POA: Insufficient documentation

## 2012-08-01 DIAGNOSIS — D649 Anemia, unspecified: Secondary | ICD-10-CM

## 2012-08-01 DIAGNOSIS — N289 Disorder of kidney and ureter, unspecified: Secondary | ICD-10-CM | POA: Insufficient documentation

## 2012-08-01 DIAGNOSIS — E441 Mild protein-calorie malnutrition: Secondary | ICD-10-CM

## 2012-08-01 DIAGNOSIS — N23 Unspecified renal colic: Secondary | ICD-10-CM | POA: Insufficient documentation

## 2012-08-01 DIAGNOSIS — R310 Gross hematuria: Secondary | ICD-10-CM | POA: Insufficient documentation

## 2012-08-01 LAB — CBC WITH DIFFERENTIAL/PLATELET
Basophils Relative: 1 % (ref 0–1)
Eosinophils Absolute: 0.1 10*3/uL (ref 0.0–0.7)
Eosinophils Relative: 1 % (ref 0–5)
Hemoglobin: 9.5 g/dL — ABNORMAL LOW (ref 13.0–17.0)
MCH: 28.4 pg (ref 26.0–34.0)
MCHC: 32.5 g/dL (ref 30.0–36.0)
Monocytes Relative: 12 % (ref 3–12)
Neutrophils Relative %: 72 % (ref 43–77)

## 2012-08-01 LAB — COMPREHENSIVE METABOLIC PANEL
Alkaline Phosphatase: 55 U/L (ref 39–117)
BUN: 33 mg/dL — ABNORMAL HIGH (ref 6–23)
Creat: 2.28 mg/dL — ABNORMAL HIGH (ref 0.50–1.35)
Glucose, Bld: 91 mg/dL (ref 70–99)
Sodium: 135 mEq/L (ref 135–145)
Total Bilirubin: 0.4 mg/dL (ref 0.3–1.2)
Total Protein: 6.3 g/dL (ref 6.0–8.3)

## 2012-08-01 NOTE — Progress Notes (Signed)
Presenting complaint;  Patient is here for scheduled visit to discuss discuss GI issues. He complains of hematuria abdominal pain and nausea   Brief history or database;  Connor Small is 52 year old Caucasian male who is well known to me from previous evaluations. UC was diagnosed in 2004. He has remained in remission. In October 2012 he presented with colonic perforation and underwent surgery at [facility]. He had colostomy and resection of sigmoid colon. Cause of perforation could never be determined. He had colonoscopy by me in January 2013 and he was noted to be in remission. In February last year he had takedown of his colostomy. Patient was doing great when he was seen in the office on 01/16/2012. He developed fulminant C. difficile colitis following course of clindamycin given by his dentist for tooth abscess. 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 He did not feel better with cecostomy and a day later had subtotal colectomy with ileostomy. He he was transferred to [facility] and was in ICU for several weeks. 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 He also developed myopathy and neuropathy. According to his wife he was transferred to rehabilitation on 05/18/2012 and was discharged on 06/06/2012. He had multiple other problems including ascites and pleural effusion as well as thrombocytopenia and anemia. He has been receiving OT and PT 3 times a week at any hospital. He has right wrist drop and right upper extremity weakness and he scheduled to have nerve conduction study. He is still using a walker to ambulate but at home at times he walks without using walker.  Subjective; On 07/29/2012 he went to urgent care in [place] for gross hematuria. He was given prescription for Bactrim advised to follow with his primary care physician. He is here today for appointment that was scheduled 2 weeks ago. He's  still losing blood in his urine. He is not complaining of right flank pain. This morning he was nauseated. He denies fever or chills. Under these symptoms started he has had a good appetite. He has gained 10 pounds in the last 3 weeks. He is using pegs for Protostat and Lopressor. He has no difficulty swallowing solids or liquids. He denies melena or rectal bleeding. He is anxious to return to work. He is also interested in having [place]-pouch down the road since he is not able to take care of his colostomy because of right upper extremity weakness.  Current Medications: Current Outpatient Prescriptions  Medication Sig Dispense Refill  . aspirin 81 MG chewable tablet Place 1 tablet (81 mg total) into feeding tube daily.      . clonazePAM (KLONOPIN) 0.5 MG tablet Take 1 tablet (0.5 mg total) by mouth at bedtime. For anxiety/sleep  30 tablet  1  . HYDROcodone-acetaminophen (NORCO/VICODIN) 5-325 MG per tablet Take 1 tablet by mouth as needed.      . IRON PO Take 325 mg by mouth daily.       Marland Kitchen lidocaine (LIDODERM) 5 % Apply one patch to right shoulder at 6 am and remove at 6 pm daily.  30 patch  1  . megestrol (MEGACE ORAL) 40 MG/ML suspension Take 10 mLs (400 mg total) by mouth 2 (two) times daily.  300 mL  4  . metoprolol tartrate (LOPRESSOR) 25 mg/10 mL SUSP Place 2.5 mLs (6.25 mg total) into feeding tube daily. For blood pressure and heart  150 mL  1  . Multiple Vitamin (MULTIVITAMIN) LIQD Take 5  mLs by mouth daily.      . prochlorperazine (COMPAZINE) 10 MG tablet Take 1 tablet (10 mg total) by mouth every 6 (six) hours as needed.  60 tablet  1  . Saccharomyces boulardii (FLORASTOR PO) Take 1 tablet by mouth 2 (two) times daily.      Marland Kitchen sulfamethoxazole-trimethoprim (BACTRIM DS) 800-160 MG per tablet Take 1 tablet by mouth 2 (two) times daily.      . traMADol (ULTRAM) 50 MG tablet Place 1 tablet (50 mg total) into feeding tube every 6 (six) hours as needed. For pain  60 tablet  1   No current  facility-administered medications for this visit.     Objective: Blood pressure 108/72, pulse 78, temperature 98.3 F (36.8 C), temperature source Oral, resp. rate 18, height 5' 10"  (1.778 m), weight 139 lb 14.4 oz (63.458 kg). Patient is alert and in no acute distress. He had no difficulty moving from chair to examination table. Conjunctiva is pink. Sclera is nonicteric Oropharyngeal mucosa is normal. No neck masses or thyromegaly noted. Cardiac exam with regular rhythm normal S1 and S2. No murmur or gallop noted. Lungs are clear to auscultation. Abdomen. He has PEG in epigastric region and ileostomy in right lower quadrant. Ileostomy bag has thick brown stool. There is some erythema to midline scar around two silk sutures.  No LE edema or clubbing noted.  Labs/studies Results: Lab data from 06/05/2012 reviewed. H&H was 8.1 and 26.1 Platelet count 464K Comprehensive chemistry panel from 06/01/2012 pertinent for albumin of 2.0   Assessment:  #1. Patient's acute urologic symptoms suggest urolithiasis. He is tender over right renal angle. He is afebrile. CT of May 2014 was negative for urolithiasis but he certainly could have formed stone. He has had multiple abdominal surgeries and could develop ureteral stricture I would not expect him to have hematuria. #2. Anemia. Secondary to protracted illness. Expect improvement in H&H. #3. Malnutrition. Secondary to catastrophic illness he is a recovering. #4. Weakness involving right upper extremity and wrist drop. He is scheduled to undergo nerve conduction study. He remains under the care of neurologist and is getting physical therapy.    Plan:  Renal ultrasound as soon as possible. CBC and comprehensive chemistry panel. Office visit in 8 weeks. I will be contacting the patient and/or his wife at results of these studies and further recommendations.

## 2012-08-01 NOTE — Patient Instructions (Signed)
Physician will contact her with results of ultrasound and blood work.

## 2012-08-02 ENCOUNTER — Ambulatory Visit (HOSPITAL_COMMUNITY): Payer: BC Managed Care – PPO

## 2012-08-02 ENCOUNTER — Telehealth (INDEPENDENT_AMBULATORY_CARE_PROVIDER_SITE_OTHER): Payer: Self-pay | Admitting: *Deleted

## 2012-08-02 DIAGNOSIS — N1330 Unspecified hydronephrosis: Secondary | ICD-10-CM

## 2012-08-02 NOTE — Telephone Encounter (Signed)
Per Dr.Rehman the patient will need to have this drawn on 08/08/12.

## 2012-08-03 ENCOUNTER — Ambulatory Visit (HOSPITAL_COMMUNITY): Payer: BC Managed Care – PPO | Admitting: Physical Therapy

## 2012-08-06 ENCOUNTER — Ambulatory Visit (HOSPITAL_COMMUNITY)
Admission: RE | Admit: 2012-08-06 | Discharge: 2012-08-06 | Disposition: A | Discharge status: Home / Self Care | Payer: BC Managed Care – PPO | Source: Ambulatory Visit | Attending: Physical Medicine & Rehabilitation | Admitting: Physical Medicine & Rehabilitation

## 2012-08-06 NOTE — Progress Notes (Signed)
Physical Therapy Treatment Patient Details  Name: Connor Small MRN: [id number] Date of Birth: 21-Aug-1960  Today's Date: 08/06/2012 Time: 6770-3403 PT Time Calculation (min): 40 min Visit#: 3 of 12  Re-eval: 08/22/12 Charges:  therex 1352-1405 (13'), NMR 1406-1430 (24')   Subjective: Symptoms/Limitations Symptoms: Pt reports no difficulties or pain today. Reports compliance with his PT exercises. Pain Assessment Currently in Pain?: No/denies   Exercise/Treatments Balance Exercises Standing Tandem Stance: Eyes open;Time Tandem Stance Time: 1 rep 1' hold each SLS: Eyes open;Solid surface;3 reps;Time SLS Time: lt:21", Rt: 12" Tandem Gait: Forward;1 rep;Retro Sidestepping: 1 rep Heel Raises: 15 reps Toe Raise: 15 reps Sit to Stand: Standard surface;Limitations Sit to Stand Limitations: 5 STS no HHA Other Standing Exercises: Rockerboard 2' R/L no UE's   Seated Other Seated Exercises: NuStep seat 8, 8' level surfaces L2 with UE's      Manual Therapy Manual Therapy: Joint mobilization Joint Mobilization: Joint mobilizaton to RUE hand, wrist, and elbows to increase joint mobility during daily tasks.  Myofascial Release: MFR and manual stretching to flexor and extensor forearm, wrist, and hand to decrease pain and fascial restrictions and increase pain free mobility.   Physical Therapy Assessment and Plan PT Assessment and Plan PT Assessment:  Pt requires frequent short rest breaks due to fatigue.  Able to progress to retro tandem gait and add Nustep and SLS to program today.  Pt with minimal LOB during session today.  Completes all exercises without AD/minimal use of UE's. PT Plan: Gait training to improve balance and independendence.  NuStep and/or TM for activity tolerance.  Review HEP. Encourage standing activities.       Problem List Patient Active Problem List   Diagnosis Date Noted  . Allergic rhinitis 06/28/2012  . Aspiration pneumonitis 04/26/2012  . Clostridium  difficile colitis 04/26/2012  . Intra-abdominal fluid collection/LUQ 04/26/2012  . CKD (chronic kidney disease) stage 2, GFR 60-89 ml/min 04/26/2012  . Acute renal failure 04/26/2012  . Hypokalemia 04/26/2012  . Protein-calorie malnutrition, severe 04/25/2012  . Spleen hematoma - subcapsular & without rupture  04/25/2012  . Ascites s/p US guide paracentesis (4/2) 04/25/2012  . Ileostomy in place 04/24/2012  . HAP (hospital-acquired pneumonia) 04/21/2012  . 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 Acute blood loss anemia 04/18/2012  . S/p total colectomy for toxic megacolon, C diff 03/23/2012  . Hypernatremia, improving 03/23/2012  . Severe muscle deconditioning 03/23/2012  . Shoulder subluxation, right 03/23/2012  . 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 Coagulopathy 03/23/2012  . Quadriparesis 03/17/2012  . Leukocytosis, unspecified 03/15/2012  . Hyperbilirubinemia, due to cholestasis 03/15/2012  . PAF (paroxysmal atrial fibrillation) 03/15/2012  . Anemia of chronic disease 03/15/2012  . Pleural effusion, L > R 03/04/2012  . Sinus tachycardia 02/28/2012  . Hematuria 02/28/2012  . history of UC (ulcerative colitis) 12/06/2010    PT - End of Session Equipment Utilized During Treatment: Gait belt Activity Tolerance: Patient tolerated treatment well General Behavior During Therapy: [facility] for tasks assessed/performed   Teena Irani, PTA/CLT 08/06/2012, 2:47 PM

## 2012-08-06 NOTE — Progress Notes (Signed)
Occupational Therapy Treatment Patient Details  Name: Connor Small MRN: [id number] Date of Birth: 1960/05/18  Today's Date: 08/06/2012 Time: 4098-1191 OT Time Calculation (min): 38 min Manual Therapy 1310-1330 20' Neuro reeducation [id number] 68' Visit#: 3 of 36  Re-eval: 08/20/12     Subjective S:  I had a kidney stone that is why I didnt come last week.    Precautions/Restrictions   fall risk  Exercise/Treatments Hand Exercises Joint Blocking Exercises: 5 X P/AROM each digit, each joint Sponges: able to pick up one sponge and translate to his palm x 5 X.  picked up sponges with thumb and each digit X 2 sets each digit.  grabbed handfuls of sponges and reached out in front of his body to deposit in a bucket.   Neurological Re-education Exercises Shoulder Flexion: PROM;AROM;10 reps;Seated Shoulder ABduction: PROM;AROM;10 reps;Seated Shoulder Protraction: PROM;AROM;10 reps;Seated Shoulder Horizontal ABduction: PROM;AROM;10 reps;Seated Shoulder External Rotation: PROM;AROM;10 reps;Seated Shoulder Internal Rotation: PROM;AROM;10 reps;Seated Elbow Flexion: PROM;AROM;10 reps;Seated Elbow Extension: PROM;AROM;10 reps;Seated Forearm Supination: PROM;AROM;10 reps;Seated Forearm Pronation: PROM;AROM;10 reps;Seated Wrist Flexion: PROM;AROM;10 reps;Seated Wrist Extension: AROM;PROM;10 reps;Seated Wrist Radial Deviation: PROM;AROM;10 reps;Seated Wrist Ulnar Deviation: PROM;AROM;10 reps;Seated Sponges: able to pick up one sponge and translate to his palm x 5 X.  picked up sponges with thumb and each digit X 2 sets each digit.  grabbed handfuls of sponges and reached out in front of his body to deposit in a bucket.   Manual Therapy Manual Therapy: Joint mobilization Joint Mobilization: Joint mobilizaton to RUE hand, wrist, and elbows to increase joint mobility during daily tasks.  Myofascial Release: MFR and manual stretching to flexor and extensor forearm, wrist, and hand to decrease  pain and fascial restrictions and increase pain free mobility.   Occupational Therapy Assessment and Plan OT Assessment and Plan Clinical Impression Statement: A:  Slight increase in active and passive ROM of wrist extension.  Patient able to pick up one sponge with thumb and index and translate to palm.  Unable to hold greater than one sponge, as does not have required PIPJ DIPJ flexion to maintain grasp.  OT Plan: P: Work on increasing PROM of RUE hand by decreasing contracture. Attempt deep heating with modalities such as ultrasound, electrical stimulation, joint mobilization and passive stretching.    Goals Short Term Goals Time to Complete Short Term Goals: 6 weeks Short Term Goal 1: Patient will be educated on a HEP. Short Term Goal 1 Progress: Progressing toward goal Short Term Goal 2: Patient will weightbear on his RUE while completing ADLs with min pa. Short Term Goal 2 Progress: Progressing toward goal Short Term Goal 3: Patient will complete shower transfer with tub transfer bench with SBA. Short Term Goal 3 Progress: Progressing toward goal Short Term Goal 4: Patient will don and doff pants with reacher with SBA for safety. Short Term Goal 4 Progress: Progressing toward goal Short Term Goal 5: Patient will improve RUE and hand AROM by 25% for increased ability to use RUE as an active assist with daily activities.  Short Term Goal 5 Progress: Progressing toward goal Additional Short Term Goals?: Yes Short Term Goal 6: Patient will improve right grip strength by 10 pounds for increased ability to open containers.  Short Term Goal 6 Progress: Progressing toward goal Short Term Goal 7: Patient will complete pinch testing and coordination testing.  Short Term Goal 7 Progress: Progressing toward goal Long Term Goals Long Term Goal 1: Patient will complete all B/IADLs, work, and leisure activities at the highest  level of indepdendence possible, using his right hand as dominant.  Long  Term Goal 1 Progress: Progressing toward goal Long Term Goal 2: Patient will have WFL AROM in his right arm and hand for increased use with all functional activities.  Long Term Goal 2 Progress: Progressing toward goal Long Term Goal 3: Patient will have 25# or more grip strength and 10# or more pinch strength in his right hand for increased ability to complete woodworking and metal working [employer].  Long Term Goal 3 Progress: Progressing toward goal Long Term Goal 4: Patient will improve fine motor coordination to be able to manipulate his ostomy and ostomy supplies independently.  Long Term Goal 4 Progress: Progressing toward goal Long Term Goal 5: Patient will have WFL strength in his right arm for increased ability to complete household tasks.  Long Term Goal 5 Progress: Progressing toward goal  Problem List Patient Active Problem List   Diagnosis Date Noted  . Allergic rhinitis 06/28/2012  . Aspiration pneumonitis 04/26/2012  . Clostridium difficile colitis 04/26/2012  . Intra-abdominal fluid collection/LUQ 04/26/2012  . CKD (chronic kidney disease) stage 2, GFR 60-89 ml/min 04/26/2012  . Acute renal failure 04/26/2012  . Hypokalemia 04/26/2012  . Protein-calorie malnutrition, severe 04/25/2012  . Spleen hematoma - subcapsular & without rupture  04/25/2012  . Ascites s/p US guide paracentesis (4/2) 04/25/2012  . Ileostomy in place 04/24/2012  . HAP (hospital-acquired pneumonia) 04/21/2012  . 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 Acute blood loss anemia 04/18/2012  . S/p total colectomy for toxic megacolon, C diff 03/23/2012  . Hypernatremia, improving 03/23/2012  . Severe muscle deconditioning 03/23/2012  . Shoulder subluxation, right 03/23/2012  . 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 Coagulopathy 03/23/2012  . Quadriparesis 03/17/2012  . Leukocytosis, unspecified 03/15/2012  . Hyperbilirubinemia, due to cholestasis 03/15/2012   . PAF (paroxysmal atrial fibrillation) 03/15/2012  . Anemia of chronic disease 03/15/2012  . Pleural effusion, L > R 03/04/2012  . Sinus tachycardia 02/28/2012  . Hematuria 02/28/2012  . history of UC (ulcerative colitis) 12/06/2010    End of Session Activity Tolerance: Patient tolerated treatment well General Behavior During Therapy: [facility] for tasks assessed/performed  GO    Shirlean Mylar, OTR/L  08/06/2012, 1:54 PM

## 2012-08-08 ENCOUNTER — Ambulatory Visit (HOSPITAL_COMMUNITY)
Admission: RE | Admit: 2012-08-08 | Discharge: 2012-08-08 | Disposition: A | Discharge status: Home / Self Care | Payer: BC Managed Care – PPO | Source: Ambulatory Visit | Attending: Physical Medicine & Rehabilitation | Admitting: Physical Medicine & Rehabilitation

## 2012-08-08 LAB — BASIC METABOLIC PANEL
BUN: 29 mg/dL — ABNORMAL HIGH (ref 6–23)
CO2: 18 mEq/L — ABNORMAL LOW (ref 19–32)
Chloride: 111 mEq/L (ref 96–112)
Glucose, Bld: 79 mg/dL (ref 70–99)
Potassium: 4.1 mEq/L (ref 3.5–5.3)

## 2012-08-08 NOTE — Progress Notes (Signed)
Occupational Therapy Treatment Patient Details  Name: Connor Small MRN: [id number] Date of Birth: Aug 29, 1960  Today's Date: 08/08/2012 Time: 1324-4010 OT Time Calculation (min): 45 min Manual Therapy 2725-3664 10' Neuro Reed 4034-7425 20' ADL 1505-1520 15' Visit#: 4 of 36  Re-eval: 08/20/12    Authorization: n/a   Subjective S:  I really was sore the day after my last visit. Pain Assessment Currently in Pain?: No/denies  Exercise/Treatments Hand Exercises Joint Blocking Exercises: 5 X P/AROM each digit, each joint Theraputty: Roll;Grip Theraputty - Roll: pink bilateral hands Theraputty - Grip: supinated and pronated position  Neurological Re-education Exercises Forearm Supination: PROM;AROM;10 reps;Seated Forearm Pronation: PROM;AROM;10 reps;Seated Wrist Flexion: PROM;AROM;10 reps;Seated Wrist Extension: AROM;PROM;10 reps;Seated Wrist Radial Deviation: PROM;AROM;10 reps;Seated Wrist Ulnar Deviation: PROM;AROM;10 reps;Seated   Grasp and Release Theraputty - Roll: pink bilateral hands Theraputty - Grip: supinated and pronated position      Manual Therapy Manual Therapy: Myofascial release Joint Mobilization: Joint mobilizaton to RUE hand, wrist, and elbows to increase joint mobility during daily tasks. Myofascial Release: MFR and manual stretching to flexor and extensor forearm, wrist, and hand to decrease pain and fascial restrictions and increase pain free mobility.  Splinting Splinting: Issued patient a flexion glove for his right hand to be worn one time per hour for up to 15 minutes to increase flexion in the PIPJs and allow for greater functional grip on objects.  Occupational Therapy Assessment and Plan OT Assessment and Plan Clinical Impression Statement: A:  Patient educated on use of flexion glove for achieving greater flexion in PIPJ, patient able to demonstrate I donning and doffing of glove and proper technique to tighten tension on glove.  OT Plan: P:   Increase AROM in PIPJ by 10 degrees for increased functional grip.  Follow up on compliance with use of flexion glove.     Goals Short Term Goals Time to Complete Short Term Goals: 6 weeks Short Term Goal 1: Patient will be educated on a HEP. Short Term Goal 2: Patient will weightbear on his RUE while completing ADLs with min pa. Short Term Goal 3: Patient will complete shower transfer with tub transfer bench with SBA. Short Term Goal 4: Patient will don and doff pants with reacher with SBA for safety. Short Term Goal 5: Patient will improve RUE and hand AROM by 25% for increased ability to use RUE as an active assist with daily activities.  Additional Short Term Goals?: Yes Short Term Goal 6: Patient will improve right grip strength by 10 pounds for increased ability to open containers.  Short Term Goal 7: Patient will complete pinch testing and coordination testing.  Long Term Goals Long Term Goal 1: Patient will complete all B/IADLs, work, and leisure activities at the highest level of indepdendence possible, using his right hand as dominant.  Long Term Goal 2: Patient will have WFL AROM in his right arm and hand for increased use with all functional activities.  Long Term Goal 3: Patient will have 25# or more grip strength and 10# or more pinch strength in his right hand for increased ability to complete woodworking and metal working [employer].  Long Term Goal 4: Patient will improve fine motor coordination to be able to manipulate his ostomy and ostomy supplies independently.  Long Term Goal 5: Patient will have WFL strength in his right arm for increased ability to complete household tasks.   Problem List Patient Active Problem List   Diagnosis Date Noted  . Allergic rhinitis 06/28/2012  .  Aspiration pneumonitis 04/26/2012  . Clostridium difficile colitis 04/26/2012  . Intra-abdominal fluid collection/LUQ 04/26/2012  . CKD (chronic kidney disease) stage 2, GFR 60-89 ml/min  04/26/2012  . Acute renal failure 04/26/2012  . Hypokalemia 04/26/2012  . Protein-calorie malnutrition, severe 04/25/2012  . Spleen hematoma - subcapsular & without rupture  04/25/2012  . Ascites s/p US guide paracentesis (4/2) 04/25/2012  . Ileostomy in place 04/24/2012  . HAP (hospital-acquired pneumonia) 04/21/2012  . 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 Acute blood loss anemia 04/18/2012  . S/p total colectomy for toxic megacolon, C diff 03/23/2012  . Hypernatremia, improving 03/23/2012  . Severe muscle deconditioning 03/23/2012  . Shoulder subluxation, right 03/23/2012  . 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 Coagulopathy 03/23/2012  . Quadriparesis 03/17/2012  . Leukocytosis, unspecified 03/15/2012  . Hyperbilirubinemia, due to cholestasis 03/15/2012  . PAF (paroxysmal atrial fibrillation) 03/15/2012  . Anemia of chronic disease 03/15/2012  . Pleural effusion, L > R 03/04/2012  . Sinus tachycardia 02/28/2012  . Hematuria 02/28/2012  . history of UC (ulcerative colitis) 12/06/2010    End of Session Activity Tolerance: Patient tolerated treatment well General Behavior During Therapy: [facility] for tasks assessed/performed   Shirlean Mylar, OTR/L  08/08/2012, 4:29 PM

## 2012-08-08 NOTE — Progress Notes (Signed)
Physical Therapy Treatment Patient Details  Name: Connor Small MRN: [id number] Date of Birth: 06/29/1960  Today's Date: 08/08/2012 Time: 1345-1425 PT Time Calculation (min): 40 min Charges: TE: [id number] Visit#: 4 of 12  Re-eval: 08/22/12    Subjective: Symptoms/Limitations Symptoms: Pt states that he has had a very busy morning today.  He has been out to get his blood drawn and went to [facility].  Pt wife reports she feels more comfotable with his mobility and is mostly concerned about his fine motor skilles to help with his osteomy.  Pain Assessment Currently in Pain?: No/denies  Precautions/Restrictions     Exercise/Treatments Standing Gait Training: No AD, min guard: 87 feet 1 minute, 124 feet 1:15 Seated Long Arc Quad: Right;15 reps;Weights Long Arc Quad Weight: 5 lbs. Other Seated Knee Exercises: hip abduction 15 reps blue; hip adduction 10x10 sec holds  Other Seated Knee Exercises: hamstring curl 15 reps blue band Standing Standing Eyes Opened: Wide (BOA);Foam;Limitations Standing Eyes Opened Limitations: 2x5 minutes min guard assist, with large pegs and nuts and bolts.  Seated Other Seated Exercises: NuStep seat 8, 10' [place] #3, L3 with UE's   Physical Therapy Assessment and Plan PT Assessment and Plan Clinical Impression Statement: Continues to have greatest difficulty with activity tolerance with notable dyspnea with ambulating at end of session.  Encouraged to continue with LE exercises to improve strength and activity tolerance.  PT Plan: Complete Dynamic Gait Index next visit    Goals    Problem List Patient Active Problem List   Diagnosis Date Noted  . Allergic rhinitis 06/28/2012  . Aspiration pneumonitis 04/26/2012  . Clostridium difficile colitis 04/26/2012  . Intra-abdominal fluid collection/LUQ 04/26/2012  . CKD (chronic kidney disease) stage 2, GFR 60-89 ml/min 04/26/2012  . Acute renal failure 04/26/2012  . Hypokalemia 04/26/2012  . Protein-calorie  malnutrition, severe 04/25/2012  . Spleen hematoma - subcapsular & without rupture  04/25/2012  . Ascites s/p US guide paracentesis (4/2) 04/25/2012  . Ileostomy in place 04/24/2012  . HAP (hospital-acquired pneumonia) 04/21/2012  . 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 Acute blood loss anemia 04/18/2012  . S/p total colectomy for toxic megacolon, C diff 03/23/2012  . Hypernatremia, improving 03/23/2012  . Severe muscle deconditioning 03/23/2012  . Shoulder subluxation, right 03/23/2012  . 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 Coagulopathy 03/23/2012  . Quadriparesis 03/17/2012  . Leukocytosis, unspecified 03/15/2012  . Hyperbilirubinemia, due to cholestasis 03/15/2012  . PAF (paroxysmal atrial fibrillation) 03/15/2012  . Anemia of chronic disease 03/15/2012  . Pleural effusion, L > R 03/04/2012  . Sinus tachycardia 02/28/2012  . Hematuria 02/28/2012  . history of UC (ulcerative colitis) 12/06/2010    PT - End of Session Equipment Utilized During Treatment: Gait belt Activity Tolerance: Patient tolerated treatment well General Behavior During Therapy: [facility] for tasks assessed/performed  GP    Aneeka Bowden 08/08/2012, 2:30 PM

## 2012-08-10 ENCOUNTER — Encounter (INDEPENDENT_AMBULATORY_CARE_PROVIDER_SITE_OTHER): Payer: BC Managed Care – PPO | Admitting: Radiology

## 2012-08-10 ENCOUNTER — Ambulatory Visit (HOSPITAL_COMMUNITY)
Admission: RE | Admit: 2012-08-10 | Discharge: 2012-08-10 | Disposition: A | Discharge status: Home / Self Care | Payer: BC Managed Care – PPO | Source: Ambulatory Visit | Attending: Physical Medicine & Rehabilitation | Admitting: Physical Medicine & Rehabilitation

## 2012-08-10 ENCOUNTER — Ambulatory Visit (INDEPENDENT_AMBULATORY_CARE_PROVIDER_SITE_OTHER): Payer: BC Managed Care – PPO | Admitting: Neurology

## 2012-08-10 DIAGNOSIS — I4891 Unspecified atrial fibrillation: Secondary | ICD-10-CM

## 2012-08-10 DIAGNOSIS — G7281 Critical illness myopathy: Secondary | ICD-10-CM

## 2012-08-10 DIAGNOSIS — R29898 Other symptoms and signs involving the musculoskeletal system: Secondary | ICD-10-CM | POA: Insufficient documentation

## 2012-08-10 DIAGNOSIS — I6350 Cerebral infarction due to unspecified occlusion or stenosis of unspecified cerebral artery: Secondary | ICD-10-CM

## 2012-08-10 DIAGNOSIS — Z0289 Encounter for other administrative examinations: Secondary | ICD-10-CM

## 2012-08-10 NOTE — Progress Notes (Signed)
Occupational Therapy Treatment Patient Details  Name: Connor Small MRN: [id number] Date of Birth: 05-Jan-1961  Today's Date: 08/10/2012 Time: 8413-2440 OT Time Calculation (min): 45 min MFR 1430-1500 30' ES 1027-2536 15'   Visit#: 5 of 36  Re-eval: 08/20/12    Authorization: n/a  Authorization Time Period:    Authorization Visit#:   of    Subjective Symptoms/Limitations Symptoms: S: I wore the glove yesterday. We haven't been home long enough to wear it today. I think it's doing what it's suppose to be doing.  Pain Assessment Currently in Pain?: No/denies Pain Score: 7  Pain Location: Arm Pain Orientation: Right Pain Type: Other (Comment) (shots in Rt arm soreness)  Precautions/Restrictions  Precautions Precautions: Fall  Exercise/Treatments Wrist Exercises Wrist Flexion: PROM;5 reps Wrist Extension: PROM;5 reps   Sponges: Able to pick up 3 at once and place in container. Used low resistance and high resistance sponges.   Hand Exercises MCPJ Flexion: PROM;5 reps MCPJ Extension: PROM;5 reps PIPJ Flexion: PROM;5 reps PIPJ Extension: PROM;5 reps DIPJ Flexion: PROM;5 reps DIPJ Extension: PROM;5 reps Sponges: Able to pick up 3 at once and place in container. Used low resistance and high resistance sponges.      Modalities Modalities: Electrical Stimulation Manual Therapy Manual Therapy: Myofascial release Joint Mobilization: Joint mobilizaton to RUE hand, wrist, and elbows to increase joint mobility during daily tasks. Myofascial Release: MFR and manual stretching to flexor and extensor forearm, wrist, and hand to decrease pain and fascial restrictions and increase pain free mobility.  Pharmacologist Location: Wrist and digit flexors Electrical Stimulation Action: [place] Electrical Stimulation Parameters: Cycle: 5/5, 31 mAcc 5 minutes Electrical Stimulation Goals: Neuromuscular facilitation  Occupational Therapy Assessment and  Plan OT Assessment and Plan Clinical Impression Statement: A: Patient has used flexion glove for one day without complaints. Used ES on wrist and digit flexors with moderate results. Was unable to perform ES on wrist and finger extensors due to machine malfunction.  OT Plan: P:  Increase AROM in PIPJ by 10 degrees for increased functional grip.     Goals Short Term Goals Time to Complete Short Term Goals: 6 weeks Short Term Goal 1: Patient will be educated on a HEP. Short Term Goal 2: Patient will weightbear on his RUE while completing ADLs with min pa. Short Term Goal 3: Patient will complete shower transfer with tub transfer bench with SBA. Short Term Goal 4: Patient will don and doff pants with reacher with SBA for safety. Short Term Goal 5: Patient will improve RUE and hand AROM by 25% for increased ability to use RUE as an active assist with daily activities.  Additional Short Term Goals?: Yes Short Term Goal 6: Patient will improve right grip strength by 10 pounds for increased ability to open containers.  Short Term Goal 7: Patient will complete pinch testing and coordination testing.  Long Term Goals Long Term Goal 1: Patient will complete all B/IADLs, work, and leisure activities at the highest level of indepdendence possible, using his right hand as dominant.  Long Term Goal 2: Patient will have WFL AROM in his right arm and hand for increased use with all functional activities.  Long Term Goal 3: Patient will have 25# or more grip strength and 10# or more pinch strength in his right hand for increased ability to complete woodworking and metal working [employer].  Long Term Goal 4: Patient will improve fine motor coordination to be able to manipulate his ostomy and ostomy supplies independently.  Long Term Goal 5: Patient will have WFL strength in his right arm for increased ability to complete household tasks.   Problem List Patient Active Problem List   Diagnosis Date Noted  .  Right hand weakness 08/10/2012  . Allergic rhinitis 06/28/2012  . Aspiration pneumonitis 04/26/2012  . Clostridium difficile colitis 04/26/2012  . Intra-abdominal fluid collection/LUQ 04/26/2012  . CKD (chronic kidney disease) stage 2, GFR 60-89 ml/min 04/26/2012  . Acute renal failure 04/26/2012  . Hypokalemia 04/26/2012  . Protein-calorie malnutrition, severe 04/25/2012  . Spleen hematoma - subcapsular & without rupture  04/25/2012  . Ascites s/p US guide paracentesis (4/2) 04/25/2012  . Ileostomy in place 04/24/2012  . HAP (hospital-acquired pneumonia) 04/21/2012  . 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 Acute blood loss anemia 04/18/2012  . S/p total colectomy for toxic megacolon, C diff 03/23/2012  . Hypernatremia, improving 03/23/2012  . Severe muscle deconditioning 03/23/2012  . Shoulder subluxation, right 03/23/2012  . 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 Coagulopathy 03/23/2012  . Quadriparesis 03/17/2012  . Leukocytosis, unspecified 03/15/2012  . Hyperbilirubinemia, due to cholestasis 03/15/2012  . PAF (paroxysmal atrial fibrillation) 03/15/2012  . Anemia of chronic disease 03/15/2012  . Pleural effusion, L > R 03/04/2012  . Sinus tachycardia 02/28/2012  . Hematuria 02/28/2012  . history of UC (ulcerative colitis) 12/06/2010    End of Session Activity Tolerance: Patient tolerated treatment well General Behavior During Therapy: [facility] for tasks assessed/performed   Limmie Patricia, OTR/L,CBIS   08/10/2012, 3:46 PM

## 2012-08-10 NOTE — Procedures (Signed)
[facility]  NCS (NERVE CONDUCTION STUDY) WITH EMG (ELECTROMYOGRAPHY) REPORT   STUDY DATE: 08/10/2012 PATIENT NAME: MOMODOU FRACASSO DOB: 04-05-1960 MRN: [id number]    TECHNOLOGIST: Kaylyn Lim ELECTROMYOGRAPHER: Levert Feinstein M.D.  CLINICAL INFORMATION:  52 years old right-handed Caucasian male, with past medical history of embolic stroke, due to sepsis, paroxysmal atrial fibrillation in March 2014, presented with altered mental status change, generalized weakness, especially in his right hand, MRI has demonstrated bilateral small acute and subacute bicerebral, right cerebellar infarction.  He continued to have right hand difficulty,.  On examination, shiny skin, atrophic looking of right hand, fused right wrist, and right proximal interphalangeal joints, right shoulder abduction 4, elbow flexion 4, elbow extension 4+, wrist flexion 4, grip 4, he has profound right wrist extension and right finger extension weakness, finger abduction 3. There was no significant sensory loss at right hand, arm.  FINDINGS: NERVE CONDUCTION STUDY: Bilateral median, ulnar sensory and motor responses were normal. Bilateral  sural sensory responses were normal. Bilateral tibial motor responses were normal.  Bilateral peroneal to EDB motor responses showed low normal, or mildly decreased C. map amplitude, with normal conduction velocity, normal F-wave latency.  Right radial sensory response was absent.  Right radial motor response was absent.     NEEDLE ELECTROMYOGRAPHY:   Selected needle examination was performed at right upper extremity muscles.   Right triceps, extensor digitorum communis: normal insertion activity, no spontaneous activity,  mixture of normal, some enlarged motor unit potential, with mildly decreased recruitment patterns  Right biceps, pronator teres, deltoid, flexor carpi ulnaris was normal,   IMPRESSION:   This is an abnormal study.  There is electrodiagnostic evidence of  mild axonal right radial neuropathy, probably proximal to the takeoff to right triceps muscle. There is also evidence of mild right median neuropathy across the wrist, consistent with a mild right carpal tunnel syndrome. There is no electrodiagnostic evidence of right cervical radiculopathy, no evidence of right ulnar neuropathy.    INTERPRETING PHYSICIAN:   Levert Feinstein M.D. Ph.D. [facility] [address] [place], [place] [address] [phone]

## 2012-08-10 NOTE — Progress Notes (Signed)
Physical Therapy Treatment Patient Details  Name: Connor Small MRN: [id number] Date of Birth: [birth year]  Today's Date: 08/10/2012 Time: 8502-7741 PT Time Calculation (min): 48 min Charge: Physical performance testings x 12' (DGI 1342-1354), TE 35' 1355-1430  Visit#: 5 of 12  Re-eval: 08/22/12 Assessment Diagnosis: CVA Surgical Date: 02/26/12 Next MD Visit: 08/20/12 Prior Therapy: inpatient and HHPT  Subjective: Symptoms/Limitations Symptoms: Pt stated he was tired today, went to doctor in [place] at 9:30 this morning.  Reported receiving shots in Rt arm with pain scale 7/10 and sore arm. Pain Assessment Currently in Pain?: Yes Pain Score: 7  Pain Location: Arm Pain Orientation: Right Pain Type: Other (Comment) (shots in Rt arm soreness)  Precautions/Restrictions  Precautions Precautions: Fall  Exercise/Treatments Mobility/Balance     Dynamic Gait Index Level Surface: Mild Impairment Change in Gait Speed: Mild Impairment Gait with Horizontal Head Turns: Mild Impairment Gait with Vertical Head Turns: Mild Impairment Gait and Pivot Turn: Mild Impairment Step Over Obstacle: Mild Impairment Step Around Obstacles: Mild Impairment Steps: Mild Impairment Total Score: 16 Seated Long Arc Quad: Right;15 reps;Weights Long Arc Quad Weight: 5 lbs. Other Seated Knee Exercises: Bil LE: hip abduction 15 reps x 10" blue; hip adduction 10x10 sec holds  Other Seated Knee Exercises: Bil LE hamstring curl 15 reps blue band  Balance Exercises Standing Cone Rotation: Foam;R/L   Physical Therapy Assessment and Plan PT Assessment and Plan Clinical Impression Statement: DGI complete with mild impaired gait mechanics.  Pt continues to have greatest difficulty wtih decreased activity tolerance, 4 rest breaks required through out session.  Began cone rotation on dynamic surface to improve balance, crossing midline and improve grip strength for Rt arm, min guard for LOB episodes. PT  Plan: Continue with current POC to improve gait mechanic, strengthening and balance activities.    Goals    Problem List Patient Active Problem List   Diagnosis Date Noted  . Right hand weakness 08/10/2012  . Allergic rhinitis 06/28/2012  . Aspiration pneumonitis 04/26/2012  . Clostridium difficile colitis 04/26/2012  . Intra-abdominal fluid collection/LUQ 04/26/2012  . CKD (chronic kidney disease) stage 2, GFR 60-89 ml/min 04/26/2012  . Acute renal failure 04/26/2012  . Hypokalemia 04/26/2012  . Protein-calorie malnutrition, severe 04/25/2012  . Spleen hematoma - subcapsular & without rupture  04/25/2012  . Ascites s/p US guide paracentesis (4/2) 04/25/2012  . Ileostomy in place 04/24/2012  . HAP (hospital-acquired pneumonia) 04/21/2012  . 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 Acute blood loss anemia 04/18/2012  . S/p total colectomy for toxic megacolon, C diff 03/23/2012  . Hypernatremia, improving 03/23/2012  . Severe muscle deconditioning 03/23/2012  . Shoulder subluxation, right 03/23/2012  . CVA, 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 28/78/6767  . Coagulopathy 03/23/2012  . Quadriparesis 03/17/2012  . Leukocytosis, unspecified 03/15/2012  . Hyperbilirubinemia, due to cholestasis 03/15/2012  . PAF (paroxysmal atrial fibrillation) 03/15/2012  . Anemia of chronic disease 03/15/2012  . Pleural effusion, L > R 03/04/2012  . Sinus tachycardia 02/28/2012  . Hematuria 02/28/2012  . history of UC (ulcerative colitis) 12/06/2010    PT - End of Session Equipment Utilized During Treatment: Gait belt Activity Tolerance: Patient tolerated treatment well General Behavior During Therapy: [facility] for tasks assessed/performed  GP    Aldona Lento 08/10/2012, 2:34 PM

## 2012-08-13 ENCOUNTER — Telehealth (INDEPENDENT_AMBULATORY_CARE_PROVIDER_SITE_OTHER): Payer: Self-pay | Admitting: *Deleted

## 2012-08-13 ENCOUNTER — Ambulatory Visit (HOSPITAL_COMMUNITY)
Admission: RE | Admit: 2012-08-13 | Discharge: 2012-08-13 | Disposition: A | Discharge status: Home / Self Care | Payer: BC Managed Care – PPO | Source: Ambulatory Visit | Attending: Physical Medicine & Rehabilitation | Admitting: Physical Medicine & Rehabilitation

## 2012-08-13 DIAGNOSIS — N1330 Unspecified hydronephrosis: Secondary | ICD-10-CM

## 2012-08-13 NOTE — Progress Notes (Signed)
Occupational Therapy Treatment Patient Details  Name: Connor Small MRN: [id number] Date of Birth: Feb 10, 1960  Today's Date: 08/13/2012 Time: 4098-1191 OT Time Calculation (min): 52 min MFR/manual stretching 4782-9562 30' ES 1308-6578 22'  Visit#: 6 of 36  Re-eval: 08/20/12    Authorization: n/a  Authorization Time Period:    Authorization Visit#:   of    Subjective Symptoms/Limitations Symptoms: S: I wore the glove for 1.5 hours yesterday.  Pain Assessment Currently in Pain?: No/denies  Precautions/Restrictions  Precautions Precautions: Fall  Exercise/Treatments Wrist Exercises Wrist Flexion: PROM;10 reps Wrist Extension: PROM;10 reps Wrist Radial Deviation: PROM;10 reps Wrist Ulnar Deviation: PROM;10 reps  Hand Exercises MCPJ Flexion: PROM;10 reps MCPJ Extension: PROM;10 reps PIPJ Flexion: PROM;10 reps PIPJ Extension: PROM;10 reps DIPJ Flexion: PROM;10 reps DIPJ Extension: PROM;10 reps Joint Blocking Exercises: 5 X PROM each digit, each joint Digit Composite Abduction: AROM;5 reps Digit Composite Adduction: AROM;5 reps     Modalities Modalities: Electrical Stimulation Manual Therapy Manual Therapy: Myofascial release Joint Mobilization: Joint mobilizaton to RUE hand, wrist, and elbows to increase joint mobility during daily tasks Myofascial Release: MFR and manual stretching to flexor and extensor forearm, wrist, and hand to decrease pain and fascial restrictions and increase pain free mobility. Pharmacologist Location: Wrist and digit flexors and extensors Electrical Stimulation Action: Doctor, hospital Parameters: Flexors: Cycle: 5/5, 30 mAcc 10 minutes Extensors: Cycle: 5/5, 41 mA cc 5 minutes Electrical Stimulation Goals: Neuromuscular facilitation  Occupational Therapy Assessment and Plan OT Assessment and Plan Clinical Impression Statement: A: Tolerated ES to wrist and hand flexors and extensors. Patient  able to Abduct left pointer finger actively which he states is new.  OT Plan: P:  Increase AROM in PIPJ by 10 degrees for increased functional grip.     Goals Short Term Goals Time to Complete Short Term Goals: 6 weeks Short Term Goal 1: Patient will be educated on a HEP. Short Term Goal 1 Progress: Progressing toward goal Short Term Goal 2: Patient will weightbear on his RUE while completing ADLs with min pa. Short Term Goal 2 Progress: Progressing toward goal Short Term Goal 3: Patient will complete shower transfer with tub transfer bench with SBA. Short Term Goal 3 Progress: Progressing toward goal Short Term Goal 4: Patient will don and doff pants with reacher with SBA for safety. Short Term Goal 4 Progress: Progressing toward goal Short Term Goal 5: Patient will improve RUE and hand AROM by 25% for increased ability to use RUE as an active assist with daily activities.  Short Term Goal 5 Progress: Progressing toward goal Additional Short Term Goals?: Yes Short Term Goal 6: Patient will improve right grip strength by 10 pounds for increased ability to open containers.  Short Term Goal 6 Progress: Progressing toward goal Short Term Goal 7: Patient will complete pinch testing and coordination testing.  Short Term Goal 7 Progress: Progressing toward goal Long Term Goals Long Term Goal 1: Patient will complete all B/IADLs, work, and leisure activities at the highest level of indepdendence possible, using his right hand as dominant.  Long Term Goal 1 Progress: Progressing toward goal Long Term Goal 2: Patient will have WFL AROM in his right arm and hand for increased use with all functional activities.  Long Term Goal 2 Progress: Progressing toward goal Long Term Goal 3: Patient will have 25# or more grip strength and 10# or more pinch strength in his right hand for increased ability to complete woodworking and metal working [employer].  Long Term Goal 3 Progress: Progressing toward  goal Long Term Goal 4: Patient will improve fine motor coordination to be able to manipulate his ostomy and ostomy supplies independently.  Long Term Goal 4 Progress: Progressing toward goal Long Term Goal 5: Patient will have WFL strength in his right arm for increased ability to complete household tasks.  Long Term Goal 5 Progress: Progressing toward goal  Problem List Patient Active Problem List   Diagnosis Date Noted  . Right hand weakness 08/10/2012  . Allergic rhinitis 06/28/2012  . Aspiration pneumonitis 04/26/2012  . Clostridium difficile colitis 04/26/2012  . Intra-abdominal fluid collection/LUQ 04/26/2012  . CKD (chronic kidney disease) stage 2, GFR 60-89 ml/min 04/26/2012  . Acute renal failure 04/26/2012  . Hypokalemia 04/26/2012  . Protein-calorie malnutrition, severe 04/25/2012  . Spleen hematoma - subcapsular & without rupture  04/25/2012  . Ascites s/p US guide paracentesis (4/2) 04/25/2012  . Ileostomy in place 04/24/2012  . HAP (hospital-acquired pneumonia) 04/21/2012  . 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 Acute blood loss anemia 04/18/2012  . S/p total colectomy for toxic megacolon, C diff 03/23/2012  . Hypernatremia, improving 03/23/2012  . Severe muscle deconditioning 03/23/2012  . Shoulder subluxation, right 03/23/2012  . 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 Coagulopathy 03/23/2012  . Quadriparesis 03/17/2012  . Leukocytosis, unspecified 03/15/2012  . Hyperbilirubinemia, due to cholestasis 03/15/2012  . PAF (paroxysmal atrial fibrillation) 03/15/2012  . Anemia of chronic disease 03/15/2012  . Pleural effusion, L > R 03/04/2012  . Sinus tachycardia 02/28/2012  . Hematuria 02/28/2012  . history of UC (ulcerative colitis) 12/06/2010    End of Session Activity Tolerance: Patient tolerated treatment well General Behavior During Therapy: [facility] for tasks assessed/performed   Limmie Patricia, OTR/L,CBIS    08/13/2012, 4:14 PM

## 2012-08-13 NOTE — Progress Notes (Signed)
Physical Therapy Treatment Patient Details  Name: Connor Small MRN: [id number] Date of Birth: 02-09-1960  Today's Date: 08/13/2012 Time: 7371-0626 PT Time Calculation (min): 39 min Charge: TE 38' 1612-1650  Visit#: 6 of 12  Re-eval: 08/22/12   Subjective: Symptoms/Limitations Symptoms: Pt reported UE pain reduced following estim with OT.  Currently pain free.  Increased functional skills around the house, has been walking without AD.  Stated most difficutly with stairs Pain Assessment Currently in Pain?: No/denies  Precautions/Restrictions  Precautions Precautions: Fall  Exercise/Treatments Aerobic Elliptical: 1 minute Standing Heel Raises: 15 reps;3 seconds;Limitations Heel Raises Limitations: toe raises Lateral Step Up: Both;10 reps;Hand Hold: 0;Step Height: 4" Forward Step Up: Both;10 reps;Hand Hold: 0;Step Height: 4" Step Down: Both;10 reps;Hand Hold: 1;Step Height: 4" Stairs: reciprocal pattern small stairs in dept 2RT with handrails Rocker Board: 2 minutes Gait Training: No Ad throughout session   Physical Therapy Assessment and Plan PT Assessment and Plan Clinical Impression Statement: Added functional strengtheing exercises to improve  ease ambulating up and down stairs.  Pt able to follow demonstration with ease.  Pt very limited by fatigue with increase demand and required 3 rest breaks through session.  Added elliptical to improve sequencing with UE and LE as well as improve activty tolerance, pt able to tolerate for 1 minute before fatigued. PT Plan: Continue with current POC to improve gait mechanic, strengthening and balance activities.    Goals    Problem List Patient Active Problem List   Diagnosis Date Noted  . Right hand weakness 08/10/2012  . Allergic rhinitis 06/28/2012  . Aspiration pneumonitis 04/26/2012  . Clostridium difficile colitis 04/26/2012  . Intra-abdominal fluid collection/LUQ 04/26/2012  . CKD (chronic kidney disease) stage 2, GFR  60-89 ml/min 04/26/2012  . Acute renal failure 04/26/2012  . Hypokalemia 04/26/2012  . Protein-calorie malnutrition, severe 04/25/2012  . Spleen hematoma - subcapsular & without rupture  04/25/2012  . Ascites s/p US guide paracentesis (4/2) 04/25/2012  . Ileostomy in place 04/24/2012  . HAP (hospital-acquired pneumonia) 04/21/2012  . 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 Acute blood loss anemia 04/18/2012  . S/p total colectomy for toxic megacolon, C diff 03/23/2012  . Hypernatremia, improving 03/23/2012  . Severe muscle deconditioning 03/23/2012  . Shoulder subluxation, right 03/23/2012  . CVA, 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 94/85/4627  . Coagulopathy 03/23/2012  . Quadriparesis 03/17/2012  . Leukocytosis, unspecified 03/15/2012  . Hyperbilirubinemia, due to cholestasis 03/15/2012  . PAF (paroxysmal atrial fibrillation) 03/15/2012  . Anemia of chronic disease 03/15/2012  . Pleural effusion, L > R 03/04/2012  . Sinus tachycardia 02/28/2012  . Hematuria 02/28/2012  . history of UC (ulcerative colitis) 12/06/2010    PT - End of Session Equipment Utilized During Treatment: Gait belt Activity Tolerance: Patient tolerated treatment well;Patient limited by fatigue General Behavior During Therapy: [facility] for tasks assessed/performed  GP    Aldona Lento 08/13/2012, 5:58 PM

## 2012-08-13 NOTE — Telephone Encounter (Signed)
Per Dr. Laural Golden the patient is to have drawn on 08/22/12.

## 2012-08-14 ENCOUNTER — Encounter: Payer: Self-pay | Admitting: Emergency Medicine

## 2012-08-15 ENCOUNTER — Ambulatory Visit (HOSPITAL_COMMUNITY)
Admission: RE | Admit: 2012-08-15 | Discharge: 2012-08-15 | Disposition: A | Discharge status: Home / Self Care | Payer: BC Managed Care – PPO | Source: Ambulatory Visit | Attending: Physical Medicine & Rehabilitation | Admitting: Physical Medicine & Rehabilitation

## 2012-08-15 NOTE — Progress Notes (Signed)
Occupational Therapy Treatment Patient Details  Name: Connor Small MRN: [id number] Date of Birth: 13-Apr-1960  Today's Date: 08/15/2012 Time: 1352-1440 OT Time Calculation (min): 48 min MFR 4098-1191 28' ES 4782-9562 20' Visit#: 7 of 36  Re-eval: 08/20/12    Authorization: n/a  Authorization Time Period:    Authorization Visit#:   of    Subjective Symptoms/Limitations Symptoms: S: I think I may be getting a little more movement in my hand.  Pain Assessment Currently in Pain?: No/denies  Precautions/Restrictions  Precautions Precautions: Fall  Exercise/Treatments Wrist Exercises Wrist Flexion: PROM;10 reps Wrist Extension: PROM;10 reps Wrist Radial Deviation: PROM;10 reps Wrist Ulnar Deviation: PROM;10 reps   Hand Exercises MCPJ Flexion: PROM;10 reps MCPJ Extension: PROM;10 reps PIPJ Flexion: PROM;10 reps PIPJ Extension: PROM;10 reps DIPJ Flexion: PROM;10 reps DIPJ Extension: PROM;10 reps     Manual Therapy Manual Therapy: Joint mobilization Joint Mobilization: Joint mobilizaton to RUE hand, wrist, and elbows to increase joint mobility during daily tasks Electrical Stimulation Electrical Stimulation Location: Wrist and digit flexors and extensors Electrical Stimulation Action: Doctor, hospital Parameters: Flexors: Cycle: 5/5, 30 mAcc 10 minutes Extensors: Cycle: 5/5, 35 mA cc 10 minutes Electrical Stimulation Goals: Neuromuscular facilitation  Occupational Therapy Assessment and Plan OT Assessment and Plan Clinical Impression Statement: A: Patient with slight increase in MCP flexion and extension during manual stretching. Ring finger MCP flexion has greatly increased. OT Plan: P: reassess for MD appt on Monday.   Goals Short Term Goals Time to Complete Short Term Goals: 6 weeks Short Term Goal 1: Patient will be educated on a HEP. Short Term Goal 1 Progress: Met Short Term Goal 2: Patient will weightbear on his RUE while completing ADLs with  min pa. Short Term Goal 2 Progress: Progressing toward goal Short Term Goal 3: Patient will complete shower transfer with tub transfer bench with SBA. Short Term Goal 3 Progress: Met Short Term Goal 4: Patient will don and doff pants with reacher with SBA for safety. Short Term Goal 4 Progress: Met Short Term Goal 5: Patient will improve RUE and hand AROM by 25% for increased ability to use RUE as an active assist with daily activities.  Short Term Goal 5 Progress: Progressing toward goal Additional Short Term Goals?: Yes Short Term Goal 6: Patient will improve right grip strength by 10 pounds for increased ability to open containers.  Short Term Goal 6 Progress: Progressing toward goal Short Term Goal 7: Patient will complete pinch testing and coordination testing.  Short Term Goal 7 Progress: Progressing toward goal Long Term Goals Long Term Goal 1: Patient will complete all B/IADLs, work, and leisure activities at the highest level of indepdendence possible, using his right hand as dominant.  Long Term Goal 1 Progress: Progressing toward goal Long Term Goal 2: Patient will have WFL AROM in his right arm and hand for increased use with all functional activities.  Long Term Goal 2 Progress: Progressing toward goal Long Term Goal 3: Patient will have 25# or more grip strength and 10# or more pinch strength in his right hand for increased ability to complete woodworking and metal working [employer].  Long Term Goal 3 Progress: Progressing toward goal Long Term Goal 4: Patient will improve fine motor coordination to be able to manipulate his ostomy and ostomy supplies independently.  Long Term Goal 4 Progress: Progressing toward goal Long Term Goal 5: Patient will have WFL strength in his right arm for increased ability to complete household tasks.  Long Term Goal  5 Progress: Progressing toward goal  Problem List Patient Active Problem List   Diagnosis Date Noted  . Right hand weakness  08/10/2012  . Allergic rhinitis 06/28/2012  . Aspiration pneumonitis 04/26/2012  . Clostridium difficile colitis 04/26/2012  . Intra-abdominal fluid collection/LUQ 04/26/2012  . CKD (chronic kidney disease) stage 2, GFR 60-89 ml/min 04/26/2012  . Acute renal failure 04/26/2012  . Hypokalemia 04/26/2012  . Protein-calorie malnutrition, severe 04/25/2012  . Spleen hematoma - subcapsular & without rupture  04/25/2012  . Ascites s/p US guide paracentesis (4/2) 04/25/2012  . Ileostomy in place 04/24/2012  . HAP (hospital-acquired pneumonia) 04/21/2012  . 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 Acute blood loss anemia 04/18/2012  . S/p total colectomy for toxic megacolon, C diff 03/23/2012  . Hypernatremia, improving 03/23/2012  . Severe muscle deconditioning 03/23/2012  . Shoulder subluxation, right 03/23/2012  . 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 Coagulopathy 03/23/2012  . Quadriparesis 03/17/2012  . Leukocytosis, unspecified 03/15/2012  . Hyperbilirubinemia, due to cholestasis 03/15/2012  . PAF (paroxysmal atrial fibrillation) 03/15/2012  . Anemia of chronic disease 03/15/2012  . Pleural effusion, L > R 03/04/2012  . Sinus tachycardia 02/28/2012  . Hematuria 02/28/2012  . history of UC (ulcerative colitis) 12/06/2010    End of Session Activity Tolerance: Patient tolerated treatment well General Behavior During Therapy: [facility] for tasks assessed/performed   Limmie Patricia, OTR/L,CBIS   08/15/2012, 2:52 PM

## 2012-08-15 NOTE — Progress Notes (Signed)
Physical Therapy Treatment Patient Details  Name: Connor Small MRN: [id number] Date of Birth: 1960/08/09  Today's Date: 08/15/2012 Time: 1302-1347 PT Time Calculation (min): 45 min Charge: TE 4128-7867  Visit#: 7 of 12  Re-eval: 08/22/12 Assessment Diagnosis: CVA Surgical Date: 02/26/12 Next MD Visit: 08/20/12 Prior Therapy: inpatient and HHPT  Subjective: Symptoms/Limitations Symptoms: Pt reported he has not eaten yet today, been to MD office earlier today.  Pain free just limited by fatigue at entrance.   Pain Assessment Currently in Pain?: No/denies  Precautions/Restrictions  Precautions Precautions: Fall  Exercise/Treatments Aerobic Stationary Bike: NuStep x 8 minutes SPM>80 for activity tolerance Elliptical: 2 minutes with 2 rest breaks  Standing Heel Raises: 15 reps;3 seconds;Limitations Heel Raises Limitations: toe raises Lateral Step Up: Both;10 reps;Hand Hold: 0;Step Height: 4" Forward Step Up: Both;10 reps;Hand Hold: 0;Step Height: 4" Step Down: Both;10 reps;Hand Hold: 1;Step Height: 4" Functional Squat: 15 reps Rocker Board: 2 minutes Seated Long Arc Quad: Both;15 reps;Weights;Limitations Long Arc Quad Weight: 3 lbs. Long [employer] Limitations: sitting on dynadisk    Physical Therapy Assessment and Plan PT Assessment and Plan Clinical Impression Statement: Began squats for functional strengthening and added dynadisc with rest breaks and LAQ for core strengthening to improve sitting balance.  Pt very limited by fatigue through out session and required multiple rest breaks.  No c/o pain.   PT Plan: Re-eval prior MD apt next session.  Continue with current POC to improve gait mechanic, strengthening and balance activities.    Goals    Problem List Patient Active Problem List   Diagnosis Date Noted  . Right hand weakness 08/10/2012  . Allergic rhinitis 06/28/2012  . Aspiration pneumonitis 04/26/2012  . Clostridium difficile colitis 04/26/2012  .  Intra-abdominal fluid collection/LUQ 04/26/2012  . CKD (chronic kidney disease) stage 2, GFR 60-89 ml/min 04/26/2012  . Acute renal failure 04/26/2012  . Hypokalemia 04/26/2012  . Protein-calorie malnutrition, severe 04/25/2012  . Spleen hematoma - subcapsular & without rupture  04/25/2012  . Ascites s/p US guide paracentesis (4/2) 04/25/2012  . Ileostomy in place 04/24/2012  . HAP (hospital-acquired pneumonia) 04/21/2012  . 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 Acute blood loss anemia 04/18/2012  . S/p total colectomy for toxic megacolon, C diff 03/23/2012  . Hypernatremia, improving 03/23/2012  . Severe muscle deconditioning 03/23/2012  . Shoulder subluxation, right 03/23/2012  . CVA, 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 67/20/9470  . Coagulopathy 03/23/2012  . Quadriparesis 03/17/2012  . Leukocytosis, unspecified 03/15/2012  . Hyperbilirubinemia, due to cholestasis 03/15/2012  . PAF (paroxysmal atrial fibrillation) 03/15/2012  . Anemia of chronic disease 03/15/2012  . Pleural effusion, L > R 03/04/2012  . Sinus tachycardia 02/28/2012  . Hematuria 02/28/2012  . history of UC (ulcerative colitis) 12/06/2010    PT - End of Session Equipment Utilized During Treatment: Gait belt Activity Tolerance: Patient tolerated treatment well;Patient limited by fatigue  GP    Aldona Lento 08/15/2012, 1:40 PM

## 2012-08-15 NOTE — Telephone Encounter (Signed)
Pt has a CT done at [facility] and it showed some fluid around the pt lungs. They asked that a copy be sent to Dr. Lamonte Sakai to review. Have you received this? Also the pt has a f/u in September but he wants to know should he come in sooner due to these results? Harford Bing, CMA

## 2012-08-16 ENCOUNTER — Ambulatory Visit (INDEPENDENT_AMBULATORY_CARE_PROVIDER_SITE_OTHER): Payer: BC Managed Care – PPO | Admitting: General Surgery

## 2012-08-16 ENCOUNTER — Encounter (INDEPENDENT_AMBULATORY_CARE_PROVIDER_SITE_OTHER): Payer: Self-pay | Admitting: General Surgery

## 2012-08-16 VITALS — BP 102/68 | HR 98 | Resp 14 | Ht 70.0 in | Wt 139.6 lb

## 2012-08-16 DIAGNOSIS — Z09 Encounter for follow-up examination after completed treatment for conditions other than malignant neoplasm: Secondary | ICD-10-CM

## 2012-08-16 NOTE — Progress Notes (Signed)
Chief complaint: Followup abdominal surgery  History: Patient returns for more long-term followup after an extensive hospitalization. He has a history of recent stroke and then developed severe C. Difficile colitis. At [facility] he underwent initially placement of a cecostomy tube and then subsequent total abdominal colectomy with ileostomy. He was transferred to [place] for further care. In the early postoperative period he developed wound dehiscence and evisceration which was treated with an abdominal wound VAC and gradual closure of his abdominal wall over about a week. He was hospitalized for a couple of months and was in rehabilitation for about one month and now is back home.he has made significant progress over the past month. He continues with physical therapy and is getting stronger and is more mobile. His appetite is improved and he is putting on weight. No difficulty with the ostomy device. They noticed some suture material poking up through this incision and a little but of chronic drainage. Exam: BP 102/68  Pulse 98  Resp 14  Ht 5' 10"  (1.778 m)  Wt 139 lb 9.6 oz (63.322 kg)  BMI 20.03 kg/m2 Weight up 10 pounds for one month ago A thin somewhat chronically ill-appearing white male Abdomen: Midline wound is essentially well healed but has a few of the Prolene sutures poking up through the scar tissue.  I removed several large sutures from the wound..  Assessment and plan: Status post emergency total colectomy and ileostomy and subsequent wound dehiscence.he is making really remarkable progress. No dominant or GI issues currently. He is interested in ostomy reversal which I think ultimately to be done once he makes a full recovery. He will return in 3 months.

## 2012-08-17 ENCOUNTER — Ambulatory Visit (HOSPITAL_COMMUNITY)
Admission: RE | Admit: 2012-08-17 | Discharge: 2012-08-17 | Disposition: A | Discharge status: Home / Self Care | Payer: BC Managed Care – PPO | Source: Ambulatory Visit | Attending: Physical Medicine & Rehabilitation | Admitting: Physical Medicine & Rehabilitation

## 2012-08-17 NOTE — Telephone Encounter (Signed)
I think he can follow up as currently scheduled unless he is having more SOB. If so I would see him sooner. thanks

## 2012-08-17 NOTE — Evaluation (Signed)
Occupational Therapy Re-Evaluation  Patient Details  Name: Connor Small MRN: [id number] Date of Birth: 12/10/1960  Today's Date: 08/17/2012 Time: 3016-0109 OT Time Calculation (min): 46 min Reassessment 1019-1050 31' Neuro reeducation 1050-1105 15' Visit#: 8 of 36  Re-eval: 09/14/12     Past Medical History:  Past Medical History  Diagnosis Date  . Chronic diarrhea   . Rectal bleed   . Hemorrhoids   . Ulcerative colitis     Distal UC over 8 yrs ago diagnosed  . Diverticulitis of large intestine with perforation 10/2011    done at [place] [facility]  . 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 history of UC (ulcerative colitis) 12/06/2010   Past Surgical History:  Past Surgical History  Procedure Laterality Date  . Colonoscopy  9/03  . Sigmoidoscopy       06/13/2002  . Temporary ostomy November 06, 2010      for a colon perforation that was done in [facility] (Dr Ruben Im).    . Colonoscopy  02/16/2011    Procedure: COLONOSCOPY;  Surgeon: Malissa Hippo, MD;  Location: [facility];  Service: Endoscopy;  Laterality: N/A;  100  . Flexible sigmoidoscopy  02/27/2012    Procedure: FLEXIBLE SIGMOIDOSCOPY;  Surgeon: Malissa Hippo, MD;  Location: [facility];  Service: Endoscopy;  Laterality: N/A;  with colonic decompression  . Laparotomy  02/27/2012    Procedure: EXPLORATORY LAPAROTOMY;  Surgeon: Fabio Bering, MD;  Location: [facility] ORS;  Service: General;  Laterality: N/A;  . Cecostomy  02/27/2012    Procedure: CECOSTOMY;  Surgeon: Fabio Bering, MD;  Location: [facility];  Service: General;  Laterality: N/A;  Cecostomy Tube Placement  . Colectomy  02/28/2012    Procedure: TOTAL COLECTOMY;  Surgeon: Fabio Bering, MD;  Location: [facility];  Service: General;  Laterality: N/A;  . Ileostomy  02/28/2012    Procedure: ILEOSTOMY;  Surgeon: Fabio Bering, MD;  Location: [facility] OR[facility];  Service: General;  Laterality: N/A;  . Cystoscopy w/ ureteral stent placement Bilateral 03/20/2012    Procedure: CYSTOSCOPY WITH  RETROGRADE PYELOGRAM/URETERAL STENT PLACEMENT;  Surgeon: Sebastian Ache, MD;  Location: [facility] OR;  Service: Urology;  Laterality: Bilateral;  . Laparotomy N/A 03/26/2012    Procedure: EXPLORATORY LAPAROTOMY;  Surgeon: Mariella Saa, MD;  Location: [facility] OR;  Service: General;  Laterality: N/A;  . Application of wound vac N/A 03/26/2012    Procedure: APPLICATION OF WOUND VAC;  Surgeon: Mariella Saa, MD;  Location: [facility] OR;  Service: General;  Laterality: N/A;  . Liver biopsy N/A 03/26/2012    Procedure: LIVER BIOPSY;  Surgeon: Mariella Saa, MD;  Location: [facility] OR;  Service: General;  Laterality: N/A;  . Laparotomy N/A 03/29/2012    Procedure: EXPLORATORY LAPAROTOMY, PARTIAL WOUND CLOSURE;  Surgeon: Mariella Saa, MD;  Location: [facility] OR;  Service: General;  Laterality: N/A;  . Vacuum assisted closure change N/A 03/29/2012    Procedure: Open ABDOMINAL VACUUM  CHANGE;  Surgeon: Mariella Saa, MD;  Location: [facility] OR;  Service: General;  Laterality: N/A;  . Vacuum assisted closure change N/A 04/01/2012    Procedure: removal of abdominal vac dressing and abdominal closure;  Surgeon: Mariella Saa, MD;  Location: [facility] OR;  Service: General;  Laterality: N/A;    Subjective S:  I can get in and out of the bathtub without any assistance, tie my own shoes, get dressed, all by myself.  Pain Assessment Currently in Pain?: No/denies   Re-Assessment Coordination  9 Hole Peg Test: left 26.3" right 1'05"  Additional Assessments RUE AROM (degrees) RUE Overall AROM Comments: assessed in seated (initial evaluation in seated) Right Shoulder Flexion: 130 Degrees Right Shoulder ABduction: 95 Degrees Right Shoulder Internal Rotation: 85 Degrees Right Shoulder External Rotation: 65 Degrees Right Elbow Flexion: 143 Right Elbow Extension:  (+19 (+30)) Right Forearm Pronation: 85 Degrees Right Forearm Supination: 70 Degrees Right Wrist Extension: 10 Degrees Right Wrist Flexion: 6 Degrees Right  Composite Finger Extension:  (T 0/0 I 48/0/0 L 56/0/0 R 54/0/0 S 54/0/0) Right Composite Finger Flexion:  (T 46/40 I 74/24/24 L 80/0/12 R 70/26/50 S 64/50/0) RUE PROM (degrees) RUE Overall PROM Comments: 75% in shoulder with hard end feel RUE Strength RUE Overall Strength Comments: Strength assessed this date in seated, shoulder, elbow, forearm, wrist flexion 4-5 extension 3-/5 Grip (lbs): 18 (12) Lateral Pinch: 6 lbs (left 14) 3 Point Pinch: 3 lbs (left 10) Palpation Palpation: extension contractures present in digits Right Hand Strength - Pinch (lbs) Lateral Pinch: 6 lbs (left 14) 3 Point Pinch: 3 lbs (left 10)   Exercise/Treatments    Neuro-reeducation:  faciliated maintaining neutral position with wrist extension  Occupational Therapy Assessment and Plan OT Assessment and Plan Clinical Impression Statement: A:  Reassessed this date, Patient has met 6/7 short term goals and 1/5 long term goals.   OT Plan: P:  Continue 3 x week x 4 weeks, working towards unmet goals.  Focus on increasing functional grip and fabricate week radial extension splint week of 08/27/12.    Goals Short Term Goals Time to Complete Short Term Goals: 6 weeks Short Term Goal 1: Patient will be educated on a HEP. Short Term Goal 1 Progress: Met Short Term Goal 2: Patient will weightbear on his RUE while completing ADLs with min pa. Short Term Goal 2 Progress: Met Short Term Goal 3: Patient will complete shower transfer with tub transfer bench with SBA. Short Term Goal 3 Progress: Met Short Term Goal 4: Patient will don and doff pants with reacher with SBA for safety. Short Term Goal 4 Progress: Met Short Term Goal 5: Patient will improve RUE and hand AROM by 25% for increased ability to use RUE as an active assist with daily activities.  Short Term Goal 5 Progress: Met Additional Short Term Goals?: Yes Short Term Goal 6: Patient will improve right grip strength by 10 pounds for increased ability to open  containers.  Short Term Goal 6 Progress: Progressing toward goal Short Term Goal 7: Patient will complete pinch testing and coordination testing.  Short Term Goal 7 Progress: Progressing toward goal Long Term Goals Long Term Goal 1: Patient will complete all B/IADLs, work, and leisure activities at the highest level of indepdendence possible, using his right hand as dominant.  Long Term Goal 1 Progress: Progressing toward goal Long Term Goal 2: Patient will have WFL AROM in his right arm and hand for increased use with all functional activities.  Long Term Goal 2 Progress: Progressing toward goal Long Term Goal 3: Patient will have 25# or more grip strength and 10# or more pinch strength in his right hand for increased ability to complete woodworking and metal working [employer].  Long Term Goal 3 Progress: Progressing toward goal Long Term Goal 4: Patient will improve fine motor coordination to be able to manipulate his ostomy and ostomy supplies independently.  Long Term Goal 4 Progress: Met Long Term Goal 5: Patient will have WFL strength in his right arm for increased  ability to complete household tasks.  Long Term Goal 5 Progress: Progressing toward goal Additional Long Term Goals?: Yes Long Term Goal 6: new goal established 08/17/12:  Patient will increase FMC needed to manipulate buttons, coins, etc by completing Nine Hole Peg Test in 40" or less.  Problem List Patient Active Problem List   Diagnosis Date Noted  . Right hand weakness 08/10/2012  . Allergic rhinitis 06/28/2012  . Aspiration pneumonitis 04/26/2012  . Clostridium difficile colitis 04/26/2012  . Intra-abdominal fluid collection/LUQ 04/26/2012  . CKD (chronic kidney disease) stage 2, GFR 60-89 ml/min 04/26/2012  . Acute renal failure 04/26/2012  . Hypokalemia 04/26/2012  . Protein-calorie malnutrition, severe 04/25/2012  . Spleen hematoma - subcapsular & without rupture  04/25/2012  . Ascites s/p US guide  paracentesis (4/2) 04/25/2012  . Ileostomy in place 04/24/2012  . HAP (hospital-acquired pneumonia) 04/21/2012  . 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 Acute blood loss anemia 04/18/2012  . S/p total colectomy for toxic megacolon, C diff 03/23/2012  . Hypernatremia, improving 03/23/2012  . Severe muscle deconditioning 03/23/2012  . Shoulder subluxation, right 03/23/2012  . 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 Coagulopathy 03/23/2012  . Quadriparesis 03/17/2012  . Leukocytosis, unspecified 03/15/2012  . Hyperbilirubinemia, due to cholestasis 03/15/2012  . PAF (paroxysmal atrial fibrillation) 03/15/2012  . Anemia of chronic disease 03/15/2012  . Pleural effusion, L > R 03/04/2012  . Sinus tachycardia 02/28/2012  . Hematuria 02/28/2012  . history of UC (ulcerative colitis) 12/06/2010    End of Session Activity Tolerance: Patient tolerated treatment well General Behavior During Therapy: [facility] for tasks assessed/performed  GO    Shirlean Mylar, OTR/L  08/17/2012, 12:16 PM

## 2012-08-17 NOTE — Progress Notes (Signed)
Physical Therapy Re-evaluation  Patient Details  Name: Connor Small MRN: [id number] Date of Birth: 22-Aug-1960  Today's Date: 08/17/2012 Time: 3500-9381 PT Time Calculation (min): 44 min              Visit#: 8 of 20  Re-eval: 09/14/12 Diagnosis: CVA Surgical Date: 02/26/12 Next MD Visit: 08/20/12 Prior Therapy: inpatient and HHPT Charges:  MMT X1,  1105-1110, PPT 1112-1138 (26), self care 1139-1147 (8')   Subjective Symptoms/Limitations Symptoms: Pt reports he is getting much stronger.  Spouse states he is walking more indoors without AD/outdoors with RW.  Pt also negotiated 13 steps with step to pattern/ Bilateral hand rails in home. Pain Assessment Currently in Pain?: No/denies   Objective: RLE Strength RLE Overall Strength Comments: taken in seated position Right Hip Flexion: 4/5 (was 4/5) Right Hip Extension: 4/5 (was 3/5) Right Hip ABduction: 4/5 (was 3/5) Right Hip ADduction: 4/5 (was 3/5) Right Knee Flexion: 5/5 (was 3+/5) Right Knee Extension: 5/5 (was 3+/5)  LLE Strength LLE Overall Strength Comments: taken in seated position Left Hip Flexion: 5/5 (was 3/5) Left Hip Extension: 4/5 (was 3/5) Left Hip ABduction: 4/5 (was 3/5) Left Hip ADduction: 4/5 (was 3/5) Left Knee Flexion: 5/5 (was 3+/5) Left Knee Extension: 5/5 (was 3+/5)  Berg Balance Test:  51/56 (was 38/56) DGI (Dynamic Gait Index):  20/24 (was 16/24)   Exercise/Treatments Aerobic Stationary Bike: NuStep x 8 minutes SPM>80 for activity tolerance  Physical Therapy Assessment and Plan PT Assessment and Plan Clinical Impression Statement: Pt has completed 8 physical therapy sessions over 3 weeks and has made great progress.  Mr. Parenteau has met all STG's and 2/3 LTG's.  PT has made gains in strength, balance and functional mobility.  Pt would benefit from continued skilled PT to further address dynamic balance to increase outdoor ambulation ability without AD,  improved his overall activity tolerance  and progress toward ability to negotiate stairs reciprocally. PT Frequency: Min 3X/week PT Duration: 4 weeks PT Plan: Continue with POC focusing on activity tolerance, dynamic balance and functional independence.    Goals Home Exercise Program PT Goal: Perform Home Exercise Program - Progress: Met  PT Short Term Goals Time to Complete Short Term Goals: 2 weeks PT Short Term Goal 1: Pt will improve his LE strength by one muscle grade in order to tolerate ambualting 5 minutes with LRAD. - Progress: Met PT Short Term Goal 2: Pt will improve his proprioception and demonstrate static standing bal tandem stance x15 seconds-Progress: Met PT Short Term Goal 3: Pt will complete the DGI. - Progress: Met  PT Long Term Goals Time to Complete Long Term Goals: 4 weeks PT Long Term Goal 1: Pt will improve his funcitonal strength and demonstrate sit to stand from standard surface without UE assistance.- Progress: Met PT Long Term Goal 2: Pt wil improve his activity tolerance in order to ambualte x10 minutes with RW for outdoor surfaces and SPC for indoor surface for improved QOL.- Progress: Progressing toward goal Long Term Goal 3: Pt will improve his Berg balance test to 50/56.- Progress: Met Long Term Goal 4: Pt will score greater than a 18/24 on the DGI-Progress: Met   PT - End of Session Equipment Utilized During Treatment: Gait belt Activity Tolerance: Patient tolerated treatment well;Patient limited by fatigue General Behavior During Therapy: [facility] for tasks assessed/performed   Teena Irani, PTA/CLT 08/17/2012, 1:39 PM

## 2012-08-20 ENCOUNTER — Encounter
Payer: BC Managed Care – PPO | Attending: Physical Medicine & Rehabilitation | Admitting: Physical Medicine & Rehabilitation

## 2012-08-20 ENCOUNTER — Encounter: Payer: Self-pay | Admitting: Physical Medicine & Rehabilitation

## 2012-08-20 ENCOUNTER — Ambulatory Visit (HOSPITAL_COMMUNITY)
Admission: RE | Admit: 2012-08-20 | Discharge: 2012-08-20 | Disposition: A | Discharge status: Home / Self Care | Payer: BC Managed Care – PPO | Source: Ambulatory Visit | Attending: Physical Medicine & Rehabilitation | Admitting: Physical Medicine & Rehabilitation

## 2012-08-20 VITALS — BP 102/67 | HR 100 | Resp 16 | Ht 70.0 in | Wt 139.0 lb

## 2012-08-20 DIAGNOSIS — R29898 Other symptoms and signs involving the musculoskeletal system: Secondary | ICD-10-CM | POA: Insufficient documentation

## 2012-08-20 DIAGNOSIS — G7281 Critical illness myopathy: Secondary | ICD-10-CM | POA: Insufficient documentation

## 2012-08-20 DIAGNOSIS — I69998 Other sequelae following unspecified cerebrovascular disease: Secondary | ICD-10-CM | POA: Insufficient documentation

## 2012-08-20 DIAGNOSIS — M62838 Other muscle spasm: Secondary | ICD-10-CM | POA: Insufficient documentation

## 2012-08-20 DIAGNOSIS — M24549 Contracture, unspecified hand: Secondary | ICD-10-CM

## 2012-08-20 DIAGNOSIS — M24541 Contracture, right hand: Secondary | ICD-10-CM

## 2012-08-20 DIAGNOSIS — R5381 Other malaise: Secondary | ICD-10-CM | POA: Insufficient documentation

## 2012-08-20 DIAGNOSIS — E46 Unspecified protein-calorie malnutrition: Secondary | ICD-10-CM | POA: Insufficient documentation

## 2012-08-20 DIAGNOSIS — Z933 Colostomy status: Secondary | ICD-10-CM | POA: Insufficient documentation

## 2012-08-20 DIAGNOSIS — F411 Generalized anxiety disorder: Secondary | ICD-10-CM | POA: Insufficient documentation

## 2012-08-20 DIAGNOSIS — J90 Pleural effusion, not elsewhere classified: Secondary | ICD-10-CM | POA: Insufficient documentation

## 2012-08-20 DIAGNOSIS — M6289 Other specified disorders of muscle: Secondary | ICD-10-CM

## 2012-08-20 NOTE — Progress Notes (Signed)
Physical Therapy Treatment Patient Details  Name: KEYONDRE HEPBURN MRN: [id number] Date of Birth: 1960-10-29  Today's Date: 08/20/2012 Time: 1350-1435 PT Time Calculation (min): 45 min Visit#: 9 of 20  Re-eval: 09/14/12 Charges:  Gait 1350-1416 26', therex 8756-4332 16'  Subjective: Symptoms/Limitations Symptoms: Pt states Dr. Naaman Plummer was pleased with his progress.  States he currently is without pain.  Pt came to therapy today without AD. Pain Assessment Currently in Pain?: No/denies   Exercise/Treatments Aerobic Stationary Bike: NuStep x 8 minutes SPM>80 for activity tolerance Standing Stairs: 1 flight outdoors with 1 HR, reciprocally 1RT SLS: max of 3 trials R: 14"  L:26" Gait Training: outdoors without AD in grass/uneven terrain (16', 7-8' X 2 with rest between), up/down ramp and curbs Other Standing Knee Exercises: retro, tandem and side stepping 1RT each     Physical Therapy Assessment and Plan PT Assessment and Plan Clinical Impression Statement: Added dynamic balance activities; only LOB with tandem gait but able to self-recover.  Pt without LOB with outdoor ambulation with good safety awareness and observant of environment factors.  Able to complete 1 flight of steps reciprocally using 1 HR. PT Plan: Continue with POC focusing on activity tolerance, dynamic balance and functional independence.     Problem List Patient Active Problem List   Diagnosis Date Noted  . 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  . Right hand weakness 08/10/2012  . Allergic rhinitis 06/28/2012  . Aspiration pneumonitis 04/26/2012  . Clostridium difficile colitis 04/26/2012  . Intra-abdominal fluid collection/LUQ 04/26/2012  . CKD (chronic kidney disease) stage 2, GFR 60-89 ml/min 04/26/2012  . Acute renal failure 04/26/2012  . Hypokalemia 04/26/2012  . Protein-calorie malnutrition, severe 04/25/2012  . Spleen hematoma - subcapsular & without rupture  04/25/2012  . Ascites s/p US guide  paracentesis (4/2) 04/25/2012  . Ileostomy in place 04/24/2012  . HAP (hospital-acquired pneumonia) 04/21/2012  . 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 Acute blood loss anemia 04/18/2012  . S/p total colectomy for toxic megacolon, C diff 03/23/2012  . Hypernatremia, improving 03/23/2012  . Severe muscle deconditioning 03/23/2012  . Shoulder subluxation, right 03/23/2012  . 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 Coagulopathy 03/23/2012  . Quadriparesis 03/17/2012  . Leukocytosis, unspecified 03/15/2012  . Hyperbilirubinemia, due to cholestasis 03/15/2012  . PAF (paroxysmal atrial fibrillation) 03/15/2012  . Anemia of chronic disease 03/15/2012  . Pleural effusion, L > R 03/04/2012  . Sinus tachycardia 02/28/2012  . Hematuria 02/28/2012  . history of UC (ulcerative colitis) 12/06/2010    PT - End of Session Equipment Utilized During Treatment: Gait belt Activity Tolerance: Patient tolerated treatment well;Patient limited by fatigue   Teena Irani, PTA/CLT 08/20/2012, 2:30 PM

## 2012-08-20 NOTE — Patient Instructions (Addendum)
YOU NEED TO STRETCH YOUR HAND 3X PER DAY. (30 MINUTES PER SESSION)   TRY WARMING UP YOUR HAND IN HOT WATER PRIOR TO STRETCHING.  WEAN OFF LOPRESSOR

## 2012-08-20 NOTE — Progress Notes (Signed)
Occupational Therapy Treatment Patient Details  Name: Connor Small MRN: [id number] Date of Birth: October 20, 1960  Today's Date: 08/20/2012 Time: 4696-2952 OT Time Calculation (min): 48 min NM re-ed 1430-1500 30'  ES 8413-2440 18'  Visit#: 9 of 36  Re-eval: 09/14/12    Authorization:    Authorization Time Period:    Authorization Visit#:   of    Subjective Symptoms/Limitations Symptoms: S: My wife and I have been working my hand a lot. Dr. Riley Kill said i should be working it more than 3 days a week. Pain Assessment Currently in Pain?: No/denies  Precautions/Restrictions  Precautions Precautions: Fall  Exercise/Treatments Wrist Exercises Wrist Flexion: PROM;10 reps Wrist Extension: PROM;10 reps Wrist Radial Deviation: PROM;10 reps Wrist Ulnar Deviation: PROM;10 reps   Hand Exercises MCPJ Flexion: PROM;10 reps MCPJ Extension: PROM;10 reps PIPJ Flexion: PROM;10 reps PIPJ Extension: PROM;10 reps DIPJ Flexion: PROM;10 reps DIPJ Extension: PROM;10 reps Other Hand Exercises: Clothespins placed and removed clothespins from containers edge. Min difficulty.  Other Hand Exercises: Actively squeezed green eggciserr during ES flexion; 12 minutes     Manual Therapy Joint Mobilization: Joint mobilizaton to RUE hand, wrist, and elbows to increase joint mobility during daily tasks Museum/gallery exhibitions officer Stimulation Location: Wrist and digit flexors  Electrical Stimulation Action: Doctor, hospital Parameters: Flexors: Cycle: 5/5, 30 mAcc 12 minutes Electrical Stimulation Goals: Neuromuscular facilitation  Occupational Therapy Assessment and Plan OT Assessment and Plan Clinical Impression Statement: A: Patient able to perform thumb oppostition to middle finger this date for the first time.  OT Plan: P: Fabricate radial extension splint week 08/27/12   Goals Short Term Goals Time to Complete Short Term Goals: 6 weeks Short Term Goal 1: Patient will be  educated on a HEP. Short Term Goal 2: Patient will weightbear on his RUE while completing ADLs with min pa. Short Term Goal 3: Patient will complete shower transfer with tub transfer bench with SBA. Short Term Goal 4: Patient will don and doff pants with reacher with SBA for safety. Short Term Goal 5: Patient will improve RUE and hand AROM by 25% for increased ability to use RUE as an active assist with daily activities.  Additional Short Term Goals?: Yes Short Term Goal 6: Patient will improve right grip strength by 10 pounds for increased ability to open containers.  Short Term Goal 6 Progress: Progressing toward goal Short Term Goal 7: Patient will complete pinch testing and coordination testing.  Short Term Goal 7 Progress: Progressing toward goal Long Term Goals Long Term Goal 1: Patient will complete all B/IADLs, work, and leisure activities at the highest level of indepdendence possible, using his right hand as dominant.  Long Term Goal 1 Progress: Progressing toward goal Long Term Goal 2: Patient will have WFL AROM in his right arm and hand for increased use with all functional activities.  Long Term Goal 2 Progress: Progressing toward goal Long Term Goal 3: Patient will have 25# or more grip strength and 10# or more pinch strength in his right hand for increased ability to complete woodworking and metal working [employer].  Long Term Goal 3 Progress: Progressing toward goal Long Term Goal 4: Patient will improve fine motor coordination to be able to manipulate his ostomy and ostomy supplies independently.  Long Term Goal 5: Patient will have WFL strength in his right arm for increased ability to complete household tasks.  Long Term Goal 5 Progress: Progressing toward goal Additional Long Term Goals?: Yes Long Term Goal 6: new goal established  08/17/12:  Patient will increase FMC needed to manipulate buttons, coins, etc by completing Nine Hole Peg Test in 40" or less. Long Term Goal 6  Progress: Progressing toward goal  Problem List Patient Active Problem List   Diagnosis Date Noted  . 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  . Right hand weakness 08/10/2012  . Allergic rhinitis 06/28/2012  . Aspiration pneumonitis 04/26/2012  . Clostridium difficile colitis 04/26/2012  . Intra-abdominal fluid collection/LUQ 04/26/2012  . CKD (chronic kidney disease) stage 2, GFR 60-89 ml/min 04/26/2012  . Acute renal failure 04/26/2012  . Hypokalemia 04/26/2012  . Protein-calorie malnutrition, severe 04/25/2012  . Spleen hematoma - subcapsular & without rupture  04/25/2012  . Ascites s/p US guide paracentesis (4/2) 04/25/2012  . Ileostomy in place 04/24/2012  . HAP (hospital-acquired pneumonia) 04/21/2012  . 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 Acute blood loss anemia 04/18/2012  . S/p total colectomy for toxic megacolon, C diff 03/23/2012  . Hypernatremia, improving 03/23/2012  . Severe muscle deconditioning 03/23/2012  . Shoulder subluxation, right 03/23/2012  . 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 Coagulopathy 03/23/2012  . Quadriparesis 03/17/2012  . Leukocytosis, unspecified 03/15/2012  . Hyperbilirubinemia, due to cholestasis 03/15/2012  . PAF (paroxysmal atrial fibrillation) 03/15/2012  . Anemia of chronic disease 03/15/2012  . Pleural effusion, L > R 03/04/2012  . Sinus tachycardia 02/28/2012  . Hematuria 02/28/2012  . history of UC (ulcerative colitis) 12/06/2010    End of Session Activity Tolerance: Patient tolerated treatment well General Behavior During Therapy: [facility] for tasks assessed/performed   Limmie Patricia, OTR/L,CBIS   08/20/2012, 3:29 PM

## 2012-08-20 NOTE — Progress Notes (Signed)
Subjective:    Patient ID: Connor Small, male    DOB: 1960-04-06, 52 y.o.   MRN: [id number]  HPI  Mr. Dorey, is back regarding his CIM and severe deconditioning. They have started E-Stim with the RUE and he has been walking with his walker. He has walked up to 200 feet or more. His hand still remains weak. His endurance is still limited.   His sutures are all out of his abdomen. He passed a few kidney stones recently.   He has gained 10 lbs. His appetite still is not back to normal.   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 His exam is really inconsistent with either  He has driven a couple times locally with his wife and no red flags were raised.     Pain Inventory Average Pain 4 Pain Right Now 0 My pain is na  In the last 24 hours, has pain interfered with the following? General activity 0 Relation with others 0 Enjoyment of life 0 What TIME of day is your pain at its worst? morning Sleep (in general) Fair  Pain is worse with: some activites Pain improves with: pacing activities Relief from Meds: 5  Mobility use a walker how many minutes can you walk? 5 ability to climb steps?  yes do you drive?  no  Function disabled: date disabled na I need assistance with the following:  meal prep, household duties and shopping  Neuro/Psych weakness trouble walking loss of taste or smell  Prior Studies Any changes since last visit?  no  Physicians involved in your care Any changes since last visit?  no   Family History  Problem Relation Age of Onset  . Cancer Sister   . Healthy Daughter   . Hypertension Mother    History   Social History  . Marital Status: Married    Spouse Name: [name]    Number of Children: 1  . 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 Smoking status: Former Smoker -- 0.50 packs/day for 12 years    Types:  Cigarettes    Quit date: 07/17/1984  . Smokeless tobacco: Never Used     Comment: Quit 23 yrs ago  . Alcohol Use: No  . Drug Use: No  . Sexually Active: Yes   Other Topics Concern  . None   Social History Narrative   Patient lives at home with his spouse.   Caffeine Use: rarely   Past Surgical History  Procedure Laterality Date  . Colonoscopy  9/03  . Sigmoidoscopy       06/13/2002  . Temporary ostomy November 06, 2010      for a colon perforation that was done in [facility] (Dr Ruben Im).    . Colonoscopy  02/16/2011    Procedure: COLONOSCOPY;  Surgeon: Malissa Hippo, MD;  Location: [facility];  Service: Endoscopy;  Laterality: N/A;  100  . Flexible sigmoidoscopy  02/27/2012    Procedure: FLEXIBLE SIGMOIDOSCOPY;  Surgeon: Malissa Hippo, MD;  Location: [facility];  Service: Endoscopy;  Laterality: N/A;  with colonic decompression  . Laparotomy  02/27/2012    Procedure: EXPLORATORY LAPAROTOMY;  Surgeon: Fabio Bering, MD;  Location: [facility] ORS;  Service: General;  Laterality: N/A;  . Cecostomy  02/27/2012    Procedure: CECOSTOMY;  Surgeon: Fabio Bering, MD;  Location: [facility];  Service: General;  Laterality: N/A;  Cecostomy Tube Placement  . Colectomy  02/28/2012    Procedure: TOTAL COLECTOMY;  Surgeon: Fabio Bering, MD;  Location: [facility];  Service: General;  Laterality: N/A;  . Ileostomy  02/28/2012    Procedure: ILEOSTOMY;  Surgeon: Fabio Bering, MD;  Location: [facility];  Service: General;  Laterality: N/A;  . Cystoscopy w/ ureteral stent placement Bilateral 03/20/2012    Procedure: CYSTOSCOPY WITH RETROGRADE PYELOGRAM/URETERAL STENT PLACEMENT;  Surgeon: Sebastian Ache, MD;  Location: [facility] OR;  Service: Urology;  Laterality: Bilateral;  . Laparotomy N/A 03/26/2012    Procedure: EXPLORATORY LAPAROTOMY;  Surgeon: Mariella Saa, MD;  Location: [facility] OR;  Service: General;  Laterality: N/A;  . Application of wound vac N/A 03/26/2012    Procedure: APPLICATION OF WOUND VAC;  Surgeon:  Mariella Saa, MD;  Location: [facility] OR;  Service: General;  Laterality: N/A;  . Liver biopsy N/A 03/26/2012    Procedure: LIVER BIOPSY;  Surgeon: Mariella Saa, MD;  Location: [facility] OR;  Service: General;  Laterality: N/A;  . Laparotomy N/A 03/29/2012    Procedure: EXPLORATORY LAPAROTOMY, PARTIAL WOUND CLOSURE;  Surgeon: Mariella Saa, MD;  Location: [facility] OR;  Service: General;  Laterality: N/A;  . Vacuum assisted closure change N/A 03/29/2012    Procedure: Open ABDOMINAL VACUUM  CHANGE;  Surgeon: Mariella Saa, MD;  Location: [facility] OR;  Service: General;  Laterality: N/A;  . Vacuum assisted closure change N/A 04/01/2012    Procedure: removal of abdominal vac dressing and abdominal closure;  Surgeon: Mariella Saa, MD;  Location: [facility] OR;  Service: General;  Laterality: N/A;   Past Medical History  Diagnosis Date  . Chronic diarrhea   . Rectal bleed   . Hemorrhoids   . Ulcerative colitis     Distal UC over 8 yrs ago diagnosed  . Diverticulitis of large intestine with perforation 10/2011    done at [facility]  . 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 history of UC (ulcerative colitis) 12/06/2010   BP 102/67  Pulse 100  Resp 16  Ht 5\' 10"  (1.778 m)  Wt 139 lb (63.05 kg)  BMI 19.94 kg/m2  SpO2 99%     Review of Systems  Constitutional: Positive for appetite change and unexpected weight change.  Musculoskeletal: Positive for gait problem.  Neurological: Positive for weakness.  All other systems reviewed and are negative.       Objective:   Physical Exam General: Alert and oriented x 3, No apparent distress. He has put on weight.  HEENT: Head is normocephalic, atraumatic, PERRLA, EOMI, sclera anicteric, oral mucosa pink and moist, dentition intact, ext ear canals clear,  Neck: Supple without JVD or lymphadenopathy  Heart: Reg rate and rhythm. No murmurs rubs or gallops  Chest: CTA bilaterally without wheezes, rales, or rhonchi; no distress  Abdomen: Soft, non-tender,  non-distended, bowel sounds positive. Ostomy site intact.  Extremities: No clubbing, cyanosis, or edema. Pulses are 2+  Skin: Clean and intact without signs of breakdown  Neuro: Pt is cognitively appropriate with normal insight, memory, and awareness. Cranial nerves 2-12 are intact. Sensory exam is normal. Reflexes are 1+ on the left, 2+ on the right. Fine motor coordination is intact. No tremors. Motor function is grossly 3+ to 4/5 LE with tight hamstrings noted. RUE is 3+/5 deltoid,  3+ to 4 BICEPS, TRICEPS, WE is 2+ to 3/5. HI are 2 to 3+ but limited by contracture.  .  Musculoskeletal:he tends to  lean into the right leg with weight bearing. He denies pain. Right hand is contracted along PIP's and MCP's.  Psych: Pt's affect is appropriate. Pt is cooperative. Non anxious   Assessment & Plan:   1. 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 Anxiety  4. 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 Left pleuaral effusion  6. Protein malnutrition   Plan:  1. Continue with outpt therapies. He needs to work aggressively on ROM to his HI. Recommended use of heat, perhaps wax to heat joints prior to ROM. Consider hand surgery assessment in a few months if he hasn't gained further ROM.   2. Stop TEDS for now. Observe for hypotension and LE edema.  3. Maintain klonopin at bedtime  4. Lopressor for HR control--could wean off as he's only taking 6.25 bid 5. Hydrocodone prn for breakthrough pain as well as the tramadol. Encouraged use of these if needed to facilitate ROM exercises in the right hand.  6. Follow up with me in 2 months. . He has made nice progress. 30 minutes of face to face patient care time were spent during this visit. All questions were encouraged and answered. I gave him permission to ride his lawnmower and drive locally and during the day after a few more trips with his wife.

## 2012-08-22 ENCOUNTER — Ambulatory Visit (HOSPITAL_COMMUNITY)
Admission: RE | Admit: 2012-08-22 | Discharge: 2012-08-22 | Disposition: A | Discharge status: Home / Self Care | Payer: BC Managed Care – PPO | Source: Ambulatory Visit | Attending: Physical Medicine & Rehabilitation | Admitting: Physical Medicine & Rehabilitation

## 2012-08-22 LAB — BASIC METABOLIC PANEL
BUN: 27 mg/dL — ABNORMAL HIGH (ref 6–23)
CO2: 19 mEq/L (ref 19–32)
Chloride: 112 mEq/L (ref 96–112)
Creat: 1.63 mg/dL — ABNORMAL HIGH (ref 0.50–1.35)
Glucose, Bld: 82 mg/dL (ref 70–99)

## 2012-08-22 NOTE — Progress Notes (Signed)
Physical Therapy Treatment Patient Details  Name: Connor Small MRN: [id number] Date of Birth: May 23, 1960  Today's Date: 08/22/2012 Time: 1302-1346 PT Time Calculation (min): 44 min Visit#: 10 of 20  Re-eval: 09/14/12 Charges:  therex 9643-8381 (10'), gait 1315-1345 (30')   Subjective: Symptoms/Limitations Symptoms: Pt states his ankles were sore after last visit.  Currently without pain or difficulty. Pain Assessment Currently in Pain?: No/denies   Exercise/Treatments Aerobic Stationary Bike: NuStep x 10 minutes SPM>80 for activity tolerance, seat 8, hills #3, Level 3 Standing Stairs: 1 flight outdoors with 1 HR, reciprocally 1RT SLS: max of 3 trials R: 25"  L:20" Gait Training: outdoors without AD in grass/uneven terrain (12', 6' X1, 4'X1  with rest between), up/down ramp and curbs Other Standing Knee Exercises: retro, tandem and side stepping 1RT each      Physical Therapy Assessment and Plan PT Assessment and Plan Clinical Impression Statement: Max time of 6' today with ambulation before needing a seated rest break.  Pt began to shuffle feet at end of ambulation session and noted SOB.  No LOB today with tandem gait; VC's to keep feet closer to increase difficulty.  Pt with good safety awareness; no LOB with outdoor ambulation.  PT Plan: Continue with POC focusing on activity tolerance, dynamic balance and functional independence.     Teena Irani, PTA/CLT 08/22/2012, 1:53 PM

## 2012-08-22 NOTE — Progress Notes (Signed)
Occupational Therapy Treatment Patient Details  Name: Connor Small MRN: [id number] Date of Birth: 1960/06/23  Today's Date: 08/22/2012 Time: 9629-5284 OT Time Calculation (min): 48 min Manual 1345-1410 25' Paraffin wax 1324-4010 10' Manual 2725-3664 13'  Visit#: 10 of 36  Re-eval: 09/14/12    Authorization: n/a  Authorization Time Period:    Authorization Visit#:   of    Subjective Symptoms/Limitations Symptoms: S: Dr. Riley Kill had mentioned using wax for my hand.  Pain Assessment Currently in Pain?: No/denies  Precautions/Restrictions  Precautions Precautions: Fall  Exercise/Treatments Wrist Exercises Wrist Flexion: PROM;10 reps Wrist Extension: PROM;10 reps Wrist Radial Deviation: PROM;10 reps Wrist Ulnar Deviation: PROM;10 reps   Hand Exercises MCPJ Flexion: PROM;10 reps MCPJ Extension: PROM;10 reps PIPJ Flexion: PROM;10 reps PIPJ Extension: PROM;10 reps DIPJ Flexion: PROM;10 reps DIPJ Extension: PROM;10 reps     Modalities Modalities:  (Paraffin Wax) Manual Therapy Other Manual Therapy: Paraffin Wax to Right hand to increase joint mobility and ROM. Manual stretching performed immediately afterwards.   Occupational Therapy Assessment and Plan OT Assessment and Plan Clinical Impression Statement: A: Performed parffin wax bath this session followed by an additional session of manual stretching.  OT Plan: P: Fabricate radial extension splint week 08/27/12. Perform paraffin wax first then manual stretching at next visit.    Goals Short Term Goals Time to Complete Short Term Goals: 6 weeks Short Term Goal 1: Patient will be educated on a HEP. Short Term Goal 2: Patient will weightbear on his RUE while completing ADLs with min pa. Short Term Goal 3: Patient will complete shower transfer with tub transfer bench with SBA. Short Term Goal 4: Patient will don and doff pants with reacher with SBA for safety. Short Term Goal 5: Patient will improve RUE and hand AROM by  25% for increased ability to use RUE as an active assist with daily activities.  Additional Short Term Goals?: Yes Short Term Goal 6: Patient will improve right grip strength by 10 pounds for increased ability to open containers.  Short Term Goal 7: Patient will complete pinch testing and coordination testing.  Long Term Goals Long Term Goal 1: Patient will complete all B/IADLs, work, and leisure activities at the highest level of indepdendence possible, using his right hand as dominant.  Long Term Goal 2: Patient will have WFL AROM in his right arm and hand for increased use with all functional activities.  Long Term Goal 3: Patient will have 25# or more grip strength and 10# or more pinch strength in his right hand for increased ability to complete woodworking and metal working [employer].  Long Term Goal 4: Patient will improve fine motor coordination to be able to manipulate his ostomy and ostomy supplies independently.  Long Term Goal 5: Patient will have WFL strength in his right arm for increased ability to complete household tasks.  Additional Long Term Goals?: Yes Long Term Goal 6: new goal established 08/17/12:  Patient will increase FMC needed to manipulate buttons, coins, etc by completing Nine Hole Peg Test in 40" or less.  Problem List Patient Active Problem List   Diagnosis Date Noted  . 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  . Right hand weakness 08/10/2012  . Allergic rhinitis 06/28/2012  . Aspiration pneumonitis 04/26/2012  . Clostridium difficile colitis 04/26/2012  . Intra-abdominal fluid collection/LUQ 04/26/2012  . CKD (chronic kidney disease) stage 2, GFR 60-89 ml/min 04/26/2012  . Acute renal failure 04/26/2012  . Hypokalemia 04/26/2012  . Protein-calorie malnutrition, severe 04/25/2012  .  Spleen hematoma - subcapsular & without rupture  04/25/2012  . Ascites s/p US guide paracentesis (4/2) 04/25/2012  . Ileostomy in place 04/24/2012  . HAP  (hospital-acquired pneumonia) 04/21/2012  . 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 Acute blood loss anemia 04/18/2012  . S/p total colectomy for toxic megacolon, C diff 03/23/2012  . Hypernatremia, improving 03/23/2012  . Severe muscle deconditioning 03/23/2012  . Shoulder subluxation, right 03/23/2012  . 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 Coagulopathy 03/23/2012  . Quadriparesis 03/17/2012  . Leukocytosis, unspecified 03/15/2012  . Hyperbilirubinemia, due to cholestasis 03/15/2012  . PAF (paroxysmal atrial fibrillation) 03/15/2012  . Anemia of chronic disease 03/15/2012  . Pleural effusion, L > R 03/04/2012  . Sinus tachycardia 02/28/2012  . Hematuria 02/28/2012  . history of UC (ulcerative colitis) 12/06/2010    End of Session Activity Tolerance: Patient tolerated treatment well General Behavior During Therapy: [facility] for tasks assessed/performed   Limmie Patricia, OTR/L,CBIS   08/22/2012, 3:26 PM

## 2012-08-24 ENCOUNTER — Ambulatory Visit (HOSPITAL_COMMUNITY)
Admission: RE | Admit: 2012-08-24 | Discharge: 2012-08-24 | Disposition: A | Discharge status: Home / Self Care | Payer: BC Managed Care – PPO | Source: Ambulatory Visit | Attending: Physical Medicine & Rehabilitation | Admitting: Physical Medicine & Rehabilitation

## 2012-08-24 DIAGNOSIS — R269 Unspecified abnormalities of gait and mobility: Secondary | ICD-10-CM | POA: Insufficient documentation

## 2012-08-24 DIAGNOSIS — I69998 Other sequelae following unspecified cerebrovascular disease: Secondary | ICD-10-CM | POA: Insufficient documentation

## 2012-08-24 DIAGNOSIS — IMO0001 Reserved for inherently not codable concepts without codable children: Secondary | ICD-10-CM | POA: Insufficient documentation

## 2012-08-24 DIAGNOSIS — M6281 Muscle weakness (generalized): Secondary | ICD-10-CM | POA: Insufficient documentation

## 2012-08-24 NOTE — Progress Notes (Signed)
Physical Therapy Treatment Patient Details  Name: Connor Small MRN: [id number] Date of Birth: 02-Oct-1960  Today's Date: 08/24/2012 Time: 0814-4818 PT Time Calculation (min): 48 min Charge: NMR 1300-1328, TE 5631-4970  Visit#: 11 of 20  Re-eval: 09/14/12    Subjective: Symptoms/Limitations Symptoms: Pt reports walking grom garage to mailbox and back to home working.  Pt states his ankles were a little sore, not as bad as last session.  Currently pain free.   Pain Assessment Currently in Pain?: No/denies  Objective:   Exercise/Treatments Aerobic Stationary Bike: NuStep x 10 minutes SPM>80 for activity tolerance, seat 8, hills #3, Level 3 Machines for Strengthening Cybex Leg Press: 3PL 15x Standing Wall Squat: 5 reps;5 seconds  Balance Exercises Standing SLS Time: Lt 44", Rt 13" max of 3 Balance Beam: Tandem, retro and side stepping 1RT Tandem Gait: Forward;1 rep;Retro Retro Gait: 1 rep Sidestepping: 1 rep Step Over Hurdles / Cones: 2RT alternating 6 and 12in hurdles Sit to Stand Limitations: 5 STS no HHA   Physical Therapy Assessment and Plan PT Assessment and Plan Clinical Impression Statement: Progressed to dynamic surface with balance training to improve proprioception with cueing to look up and maintain balance before advancing next LE, min assistance required to reduce LOB episdoes.  Advanced difficulty with therex, added leg press and wall squats for LE strengthening with visible musculature fatigue.  Pt did require 3 rest breaks throughout session, no reports of pain through session. PT Plan: Continue with POC focusing on activity tolerance, dynamic balance and functional independence.    Goals    Problem List Patient Active Problem List   Diagnosis Date Noted  . 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  . Right hand weakness 08/10/2012  . Allergic rhinitis 06/28/2012  . Aspiration pneumonitis 04/26/2012  . Clostridium difficile colitis 04/26/2012  .  Intra-abdominal fluid collection/LUQ 04/26/2012  . CKD (chronic kidney disease) stage 2, GFR 60-89 ml/min 04/26/2012  . Acute renal failure 04/26/2012  . Hypokalemia 04/26/2012  . Protein-calorie malnutrition, severe 04/25/2012  . Spleen hematoma - subcapsular & without rupture  04/25/2012  . Ascites s/p US guide paracentesis (4/2) 04/25/2012  . Ileostomy in place 04/24/2012  . HAP (hospital-acquired pneumonia) 04/21/2012  . 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 Acute blood loss anemia 04/18/2012  . S/p total colectomy for toxic megacolon, C diff 03/23/2012  . Hypernatremia, improving 03/23/2012  . Severe muscle deconditioning 03/23/2012  . Shoulder subluxation, right 03/23/2012  . 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 Coagulopathy 03/23/2012  . Quadriparesis 03/17/2012  . Leukocytosis, unspecified 03/15/2012  . Hyperbilirubinemia, due to cholestasis 03/15/2012  . PAF (paroxysmal atrial fibrillation) 03/15/2012  . Anemia of chronic disease 03/15/2012  . Pleural effusion, L > R 03/04/2012  . Sinus tachycardia 02/28/2012  . Hematuria 02/28/2012  . history of UC (ulcerative colitis) 12/06/2010    PT - End of Session Equipment Utilized During Treatment: Gait belt Activity Tolerance: Patient tolerated treatment well;Patient limited by fatigue General Behavior During Therapy: [facility] for tasks assessed/performed  GP    Aldona Lento 08/24/2012, 1:46 PM

## 2012-08-24 NOTE — Progress Notes (Signed)
Occupational Therapy Treatment Patient Details  Name: Connor Small MRN: [id number] Date of Birth: Jan 05, 1961  Today's Date: 08/24/2012 Time: 7846-9629 OT Time Calculation (min): 44 min Paraffin wax 1350-1400 10' Manual 5284-1324 34'  Visit#: 11 of 36  Re-eval: 09/14/12    Authorization: n/a  Authorization Time Period:    Authorization Visit#:   of    Subjective Symptoms/Limitations Symptoms: S: I'm noticing a little more movement in my hand.  Pain Assessment Currently in Pain?: No/denies  Precautions/Restrictions  Precautions Precautions: Fall  Exercise/Treatments Wrist Exercises Wrist Flexion: PROM;10 reps Wrist Extension: PROM;10 reps Wrist Radial Deviation: PROM;10 reps Wrist Ulnar Deviation: PROM;10 reps  Hand Exercises MCPJ Flexion: PROM;10 reps MCPJ Extension: PROM;10 reps PIPJ Flexion: PROM;10 reps PIPJ Extension: PROM;10 reps DIPJ Flexion: PROM;10 reps DIPJ Extension: PROM;10 reps     Manual Therapy Manual Therapy: Other (comment) (Paraffin wax and joint mobilization) Joint Mobilization: Joint mobilizaton to RUE hand and wrist to increase joint mobility during daily tasks Other Manual Therapy: Paraffin Wax to Right hand to increase joint mobility and ROM. Manual stretching performed immediately afterwards. Weight Bearing Technique Weight Bearing Technique: Yes RUE Weight Bearing Technique: Extended arm standing Response to Weight Bearing Technique: tolerated well. Was able to position palm flat on table top with assistance from therapist. Tolerated 1'  Occupational Therapy Assessment and Plan OT Assessment and Plan Clinical Impression Statement: A: Therapist able to stretch right wrist and hand farther after use of paraffin wax. Patient experiencing pain at end of stretch.  OT Plan: P: Fabricate radial extension splint week 08/27/12. Perform paraffin wax first then manual stretching at next visit.    Goals Short Term Goals Time to Complete Short Term  Goals: 6 weeks Short Term Goal 1: Patient will be educated on a HEP. Short Term Goal 2: Patient will weightbear on his RUE while completing ADLs with min pa. Short Term Goal 3: Patient will complete shower transfer with tub transfer bench with SBA. Short Term Goal 4: Patient will don and doff pants with reacher with SBA for safety. Short Term Goal 5: Patient will improve RUE and hand AROM by 25% for increased ability to use RUE as an active assist with daily activities.  Additional Short Term Goals?: Yes Short Term Goal 6: Patient will improve right grip strength by 10 pounds for increased ability to open containers.  Short Term Goal 7: Patient will complete pinch testing and coordination testing.  Long Term Goals Long Term Goal 1: Patient will complete all B/IADLs, work, and leisure activities at the highest level of indepdendence possible, using his right hand as dominant.  Long Term Goal 2: Patient will have WFL AROM in his right arm and hand for increased use with all functional activities.  Long Term Goal 3: Patient will have 25# or more grip strength and 10# or more pinch strength in his right hand for increased ability to complete woodworking and metal working [employer].  Long Term Goal 4: Patient will improve fine motor coordination to be able to manipulate his ostomy and ostomy supplies independently.  Long Term Goal 5: Patient will have WFL strength in his right arm for increased ability to complete household tasks.  Additional Long Term Goals?: Yes Long Term Goal 6: new goal established 08/17/12:  Patient will increase FMC needed to manipulate buttons, coins, etc by completing Nine Hole Peg Test in 40" or less.  Problem List Patient Active Problem List   Diagnosis Date Noted  . Contracture of joint of right  hand 08/20/2012  . Right hand weakness 08/10/2012  . Allergic rhinitis 06/28/2012  . Aspiration pneumonitis 04/26/2012  . Clostridium difficile colitis 04/26/2012  .  Intra-abdominal fluid collection/LUQ 04/26/2012  . CKD (chronic kidney disease) stage 2, GFR 60-89 ml/min 04/26/2012  . Acute renal failure 04/26/2012  . Hypokalemia 04/26/2012  . Protein-calorie malnutrition, severe 04/25/2012  . Spleen hematoma - subcapsular & without rupture  04/25/2012  . Ascites s/p US guide paracentesis (4/2) 04/25/2012  . Ileostomy in place 04/24/2012  . HAP (hospital-acquired pneumonia) 04/21/2012  . 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 Acute blood loss anemia 04/18/2012  . S/p total colectomy for toxic megacolon, C diff 03/23/2012  . Hypernatremia, improving 03/23/2012  . Severe muscle deconditioning 03/23/2012  . Shoulder subluxation, right 03/23/2012  . 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 Coagulopathy 03/23/2012  . Quadriparesis 03/17/2012  . Leukocytosis, unspecified 03/15/2012  . Hyperbilirubinemia, due to cholestasis 03/15/2012  . PAF (paroxysmal atrial fibrillation) 03/15/2012  . Anemia of chronic disease 03/15/2012  . Pleural effusion, L > R 03/04/2012  . Sinus tachycardia 02/28/2012  . Hematuria 02/28/2012  . history of UC (ulcerative colitis) 12/06/2010    End of Session Activity Tolerance: Patient tolerated treatment well General Behavior During Therapy: [facility] for tasks assessed/performed  Limmie Patricia, OTR/L,CBIS   08/24/2012, 4:00 PM

## 2012-08-27 ENCOUNTER — Ambulatory Visit (HOSPITAL_COMMUNITY)
Admission: RE | Admit: 2012-08-27 | Discharge: 2012-08-27 | Disposition: A | Discharge status: Home / Self Care | Payer: BC Managed Care – PPO | Source: Ambulatory Visit | Attending: Physical Medicine & Rehabilitation | Admitting: Physical Medicine & Rehabilitation

## 2012-08-27 NOTE — Progress Notes (Signed)
Occupational Therapy Treatment Patient Details  Name: Connor Small MRN: [id number] Date of Birth: 12/28/1960  Today's Date: 08/27/2012 Time: 7253-6644 OT Time Calculation (min): 45 min ES during Therex 0347-4259 14' Theract 5638-7564 31'  Visit#: 12 of 36  Re-eval: 09/14/12    Authorization: n/a  Authorization Time Period:    Authorization Visit#:   of    Subjective Symptoms/Limitations Symptoms: S: My hand is moving so much more now. I can really tell the difference. Pain Assessment Currently in Pain?: No/denies  Precautions/Restrictions  Precautions Precautions: Fall  Exercise/Treatments Hand Exercises Other Hand Exercises: Green eggercizer; squeeze/release; during ES Other Hand Exercises: Clothespins placed/removed from container edge using right hand. Patient performed task with increased speed this date. Min difficulty.     Pharmacologist Location: 1) Wrist and digit flexors 2) wrist and digit extensors  Electrical Stimulation Action: [place] Electrical Stimulation Parameters: Flexors: Cycle 5/5, 30 mA CC 10 minutes. Extensors: cycle: 5/5, 24 mA CC 12 minutes Electrical Stimulation Goals: Neuromuscular facilitation  Occupational Therapy Assessment and Plan OT Assessment and Plan Clinical Impression Statement: A: Patient completed clothespin task with increased speed this date. patient states that he has noticed increased movement/function in pointer finger, ring finger and small finger. OT Plan: P: Fabricate radial extension splint week 08/27/12.    Goals Short Term Goals Time to Complete Short Term Goals: 6 weeks Short Term Goal 1: Patient will be educated on a HEP. Short Term Goal 2: Patient will weightbear on his RUE while completing ADLs with min pa. Short Term Goal 3: Patient will complete shower transfer with tub transfer bench with SBA. Short Term Goal 4: Patient will don and doff pants with reacher with SBA for safety. Short  Term Goal 5: Patient will improve RUE and hand AROM by 25% for increased ability to use RUE as an active assist with daily activities.  Additional Short Term Goals?: Yes Short Term Goal 6: Patient will improve right grip strength by 10 pounds for increased ability to open containers.  Short Term Goal 6 Progress: Progressing toward goal Short Term Goal 7: Patient will complete pinch testing and coordination testing.  Short Term Goal 7 Progress: Progressing toward goal Long Term Goals Long Term Goal 1: Patient will complete all B/IADLs, work, and leisure activities at the highest level of indepdendence possible, using his right hand as dominant.  Long Term Goal 1 Progress: Progressing toward goal Long Term Goal 2: Patient will have WFL AROM in his right arm and hand for increased use with all functional activities.  Long Term Goal 2 Progress: Progressing toward goal Long Term Goal 3: Patient will have 25# or more grip strength and 10# or more pinch strength in his right hand for increased ability to complete woodworking and metal working [employer].  Long Term Goal 3 Progress: Progressing toward goal Long Term Goal 4: Patient will improve fine motor coordination to be able to manipulate his ostomy and ostomy supplies independently.  Long Term Goal 5: Patient will have WFL strength in his right arm for increased ability to complete household tasks.  Long Term Goal 5 Progress: Progressing toward goal Additional Long Term Goals?: Yes Long Term Goal 6: new goal established 08/17/12:  Patient will increase FMC needed to manipulate buttons, coins, etc by completing Nine Hole Peg Test in 40" or less. Long Term Goal 6 Progress: Progressing toward goal  Problem List Patient Active Problem List   Diagnosis Date Noted  . Contracture of joint of right  hand 08/20/2012  . Right hand weakness 08/10/2012  . Allergic rhinitis 06/28/2012  . Aspiration pneumonitis 04/26/2012  . Clostridium difficile colitis  04/26/2012  . Intra-abdominal fluid collection/LUQ 04/26/2012  . CKD (chronic kidney disease) stage 2, GFR 60-89 ml/min 04/26/2012  . Acute renal failure 04/26/2012  . Hypokalemia 04/26/2012  . Protein-calorie malnutrition, severe 04/25/2012  . Spleen hematoma - subcapsular & without rupture  04/25/2012  . Ascites s/p US guide paracentesis (4/2) 04/25/2012  . Ileostomy in place 04/24/2012  . HAP (hospital-acquired pneumonia) 04/21/2012  . 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 Acute blood loss anemia 04/18/2012  . S/p total colectomy for toxic megacolon, C diff 03/23/2012  . Hypernatremia, improving 03/23/2012  . Severe muscle deconditioning 03/23/2012  . Shoulder subluxation, right 03/23/2012  . 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 Coagulopathy 03/23/2012  . Quadriparesis 03/17/2012  . Leukocytosis, unspecified 03/15/2012  . Hyperbilirubinemia, due to cholestasis 03/15/2012  . PAF (paroxysmal atrial fibrillation) 03/15/2012  . Anemia of chronic disease 03/15/2012  . Pleural effusion, L > R 03/04/2012  . Sinus tachycardia 02/28/2012  . Hematuria 02/28/2012  . history of UC (ulcerative colitis) 12/06/2010    End of Session Activity Tolerance: Patient tolerated treatment well General Behavior During Therapy: [facility] for tasks assessed/performed   Limmie Patricia, OTR/L,CBIS   08/27/2012, 4:44 PM

## 2012-08-27 NOTE — Progress Notes (Signed)
Physical Therapy Treatment Patient Details  Name: Connor Small MRN: [id number] Date of Birth: 13-Jun-1960  Today's Date: 08/27/2012 Time: 7185-5015 PT Time Calculation (min): 43 min Visit#: 12 of 20  Re-eval: 09/14/12 Charges:  therex 8682-5749 (27'), gait 1419-1433 (14')  Subjective: Symptoms/Limitations Symptoms: PT states he mowed his grass with his riding lawnmower today without diffiuculty, 1-1/2 hours.  No pain today.   Exercise/Treatments Aerobic Stationary Bike: NuStep x 10 minutes SPM>80 for activity tolerance, seat 8, hills #3, Level 3 Machines for Strengthening Cybex Leg Press: 3PL 15x Standing Wall Squat: 3 sets SLS: max of 3 trials R: 25"  L:30" Gait Training: indoors 11 minutes X 1200 feet Other Standing Knee Exercises: balance beam tandem and side stepping 1RT    Physical Therapy Assessment and Plan PT Assessment and Plan PT Assessment:  Pt with increased activity tolerance today with ambulation; able to complete 1200 feet in 11 minutes without rest break.  Also with noted increase in stability with balance activities.  Overall great disposition towards therapy and making goals. PT Plan: Continue with POC focusing on activity tolerance, dynamic balance and functional independence.     Problem List Patient Active Problem List   Diagnosis Date Noted  . 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  . Right hand weakness 08/10/2012  . Allergic rhinitis 06/28/2012  . Aspiration pneumonitis 04/26/2012  . Clostridium difficile colitis 04/26/2012  . Intra-abdominal fluid collection/LUQ 04/26/2012  . CKD (chronic kidney disease) stage 2, GFR 60-89 ml/min 04/26/2012  . Acute renal failure 04/26/2012  . Hypokalemia 04/26/2012  . Protein-calorie malnutrition, severe 04/25/2012  . Spleen hematoma - subcapsular & without rupture  04/25/2012  . Ascites s/p US guide paracentesis (4/2) 04/25/2012  . Ileostomy in place 04/24/2012  . HAP (hospital-acquired pneumonia)  04/21/2012  . 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 Acute blood loss anemia 04/18/2012  . S/p total colectomy for toxic megacolon, C diff 03/23/2012  . Hypernatremia, improving 03/23/2012  . Severe muscle deconditioning 03/23/2012  . Shoulder subluxation, right 03/23/2012  . 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 Coagulopathy 03/23/2012  . Quadriparesis 03/17/2012  . Leukocytosis, unspecified 03/15/2012  . Hyperbilirubinemia, due to cholestasis 03/15/2012  . PAF (paroxysmal atrial fibrillation) 03/15/2012  . Anemia of chronic disease 03/15/2012  . Pleural effusion, L > R 03/04/2012  . Sinus tachycardia 02/28/2012  . Hematuria 02/28/2012  . history of UC (ulcerative colitis) 12/06/2010    PT - End of Session Equipment Utilized During Treatment: Gait belt Activity Tolerance: Patient tolerated treatment well;Patient limited by fatigue General Behavior During Therapy: [facility] for tasks assessed/performed   Teena Irani, PTA/CLT 08/27/2012, 2:35 PM

## 2012-08-29 ENCOUNTER — Ambulatory Visit (HOSPITAL_COMMUNITY)
Admission: RE | Admit: 2012-08-29 | Discharge: 2012-08-29 | Disposition: A | Discharge status: Home / Self Care | Payer: BC Managed Care – PPO | Source: Ambulatory Visit | Attending: Family Medicine | Admitting: Family Medicine

## 2012-08-29 NOTE — Progress Notes (Signed)
Physical Therapy Treatment Patient Details  Name: Connor Small MRN: [id number] Date of Birth: September 17, 1960  Today's Date: 08/29/2012 Time: 1012-1100 PT Time Calculation (min): 48 min Visit#: 13 of 20  Re-eval: 09/14/12 Charges:  therex 1012-1045 (33'), gait 1047-1100 (13')  Subjective: Symptoms/Limitations Symptoms: Pt states he is improving; pt. brought his daughter with him to therapy today.  No pain. Pain Assessment Currently in Pain?: No/denies   Exercise/Treatments Aerobic Stationary Bike: NuStep x 15 minutes SPM>80 for activity tolerance, seat 8, hills #3, Level 3 Machines for Strengthening Cybex Leg Press: 3PL 15x2 Standing SLS: max of 3 trials R: 28"  L:50" Gait Training: indoors 10 minutes X 1400 feet     Physical Therapy Assessment and Plan PT Assessment and Plan Clinical Impression Statement: Pt able to increase activity tolerance, completing 15' on nustep and walking further in decreased time today.  Improving SLS time with LE's as well.  PT Plan: Continue with POC focusing on activity tolerance, dynamic balance and functional independence.     Problem List Patient Active Problem List   Diagnosis Date Noted  . 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  . Right hand weakness 08/10/2012  . Allergic rhinitis 06/28/2012  . Aspiration pneumonitis 04/26/2012  . Clostridium difficile colitis 04/26/2012  . Intra-abdominal fluid collection/LUQ 04/26/2012  . CKD (chronic kidney disease) stage 2, GFR 60-89 ml/min 04/26/2012  . Acute renal failure 04/26/2012  . Hypokalemia 04/26/2012  . Protein-calorie malnutrition, severe 04/25/2012  . Spleen hematoma - subcapsular & without rupture  04/25/2012  . Ascites s/p US guide paracentesis (4/2) 04/25/2012  . Ileostomy in place 04/24/2012  . HAP (hospital-acquired pneumonia) 04/21/2012  . 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 Acute blood loss anemia 04/18/2012  . S/p total colectomy for toxic megacolon,  C diff 03/23/2012  . Hypernatremia, improving 03/23/2012  . Severe muscle deconditioning 03/23/2012  . Shoulder subluxation, right 03/23/2012  . 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 Coagulopathy 03/23/2012  . Quadriparesis 03/17/2012  . Leukocytosis, unspecified 03/15/2012  . Hyperbilirubinemia, due to cholestasis 03/15/2012  . PAF (paroxysmal atrial fibrillation) 03/15/2012  . Anemia of chronic disease 03/15/2012  . Pleural effusion, L > R 03/04/2012  . Sinus tachycardia 02/28/2012  . Hematuria 02/28/2012  . history of UC (ulcerative colitis) 12/06/2010    PT - End of Session Equipment Utilized During Treatment: Gait belt General Behavior During Therapy: [facility] for tasks assessed/performed   Teena Irani, PTA/CLT 08/29/2012, 11:04 AM

## 2012-08-29 NOTE — Progress Notes (Signed)
Occupational Therapy Treatment Patient Details  Name: Connor Small MRN: [id number] Date of Birth: March 13, 1960  Today's Date: 08/29/2012 Time: 9629-5284 OT Time Calculation (min): 49 min Manual Therapy1105-1127 22' Neuro reed 1324-4010 27' Visit#: 13 of 36  Re-eval: 09/14/12    Authorization: n/a  Authorization Time Period:    Authorization Visit#:   of    Subjective  S: I can open the refrigerator door with my right arm now! Pain Assessment Currently in Pain?: No/denies  Precautions/Restrictions     Exercise/Treatments Hand Exercises Joint Blocking Exercises: 5X PROM and 5X AROM Digit Composite Abduction:  (thumb abd add, flex at MCP and IPJ 5X each) Digit Composite Adduction: Left Hand Gripper with Large Beads: large hand gripper with 1 red rubberband with min pa from OTR/L X 20 reps Sponges: worked on sponges for increasing thumb flexion, SF and RF flexion to improve functional grip and supination of forearm.  Completed several attempts and patient was able to pick up 4 sponges individually and translate to his palm several times and was able to do so with 5 on 3 occasions.  Then picked up piles of 5 sponges and reached out to put sponges in bucket. Other Hand Exercises: placed shot gun shells into container with min pa at elbow to avoid compensatory movements initially and was able to volitinally stop this movement by the end of session.  Neurological Re-education Exercises Wrist Flexion: PROM;AROM;10 reps Wrist Extension: PROM;AROM;10 reps Wrist Radial Deviation: PROM;AROM;10 reps Wrist Ulnar Deviation: PROM;AROM;10 reps Sponges: worked on sponges for increasing thumb flexion, SF and RF flexion to improve functional grip and supination of forearm.  Completed several attempts and patient was able to pick up 4 sponges individually and translate to his palm several times and was able to do so with 5 on 3 occasions.  Then picked up piles of 5 sponges and reached out to put  sponges in bucket. Hand Gripper with Large Beads: large hand gripper with 1 red rubberband with min pa from OTR/L X 20 reps   Fine Motor Coordination Digit Composite Abduction:  (thumb abd add, flex at MCP and IPJ 5X each) Digit Composite Adduction: Left     Manual Therapy Manual Therapy: Myofascial release Myofascial Release: MFR and manual stretching to flexor and extensor forearm, wrist, and hand to decrease pain and fascial restrictions and increase pain free mobility.  Occupational Therapy Assessment and Plan OT Assessment and Plan Clinical Impression Statement: A:  Able to individually pick up sponges and translate to his palm with very little compensation this date.  Able to hold 5 sponges in his hand!! Decided against radial nerve splnt for now. OT Plan: P:  Pick up and maintain grasp on 6 sponges.  Increase PIPJ and DIPJ flexion for greater functional grasp.   Goals Short Term Goals Time to Complete Short Term Goals: 6 weeks Short Term Goal 1: Patient will be educated on a HEP. Short Term Goal 2: Patient will weightbear on his RUE while completing ADLs with min pa. Short Term Goal 3: Patient will complete shower transfer with tub transfer bench with SBA. Short Term Goal 4: Patient will don and doff pants with reacher with SBA for safety. Short Term Goal 5: Patient will improve RUE and hand AROM by 25% for increased ability to use RUE as an active assist with daily activities.  Additional Short Term Goals?: Yes Short Term Goal 6: Patient will improve right grip strength by 10 pounds for increased ability to open containers.  Short Term Goal 7: Patient will complete pinch testing and coordination testing.  Long Term Goals Long Term Goal 1: Patient will complete all B/IADLs, work, and leisure activities at the highest level of indepdendence possible, using his right hand as dominant.  Long Term Goal 2: Patient will have WFL AROM in his right arm and hand for increased use with all  functional activities.  Long Term Goal 3: Patient will have 25# or more grip strength and 10# or more pinch strength in his right hand for increased ability to complete woodworking and metal working [employer].  Long Term Goal 4: Patient will improve fine motor coordination to be able to manipulate his ostomy and ostomy supplies independently.  Long Term Goal 5: Patient will have WFL strength in his right arm for increased ability to complete household tasks.  Additional Long Term Goals?: Yes Long Term Goal 6: new goal established 08/17/12:  Patient will increase FMC needed to manipulate buttons, coins, etc by completing Nine Hole Peg Test in 40" or less.  Problem List Patient Active Problem List   Diagnosis Date Noted  . 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  . Right hand weakness 08/10/2012  . Allergic rhinitis 06/28/2012  . Aspiration pneumonitis 04/26/2012  . Clostridium difficile colitis 04/26/2012  . Intra-abdominal fluid collection/LUQ 04/26/2012  . CKD (chronic kidney disease) stage 2, GFR 60-89 ml/min 04/26/2012  . Acute renal failure 04/26/2012  . Hypokalemia 04/26/2012  . Protein-calorie malnutrition, severe 04/25/2012  . Spleen hematoma - subcapsular & without rupture  04/25/2012  . Ascites s/p US guide paracentesis (4/2) 04/25/2012  . Ileostomy in place 04/24/2012  . HAP (hospital-acquired pneumonia) 04/21/2012  . 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 Acute blood loss anemia 04/18/2012  . S/p total colectomy for toxic megacolon, C diff 03/23/2012  . Hypernatremia, improving 03/23/2012  . Severe muscle deconditioning 03/23/2012  . Shoulder subluxation, right 03/23/2012  . 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 Coagulopathy 03/23/2012  . Quadriparesis 03/17/2012  . Leukocytosis, unspecified 03/15/2012  . Hyperbilirubinemia, due to cholestasis 03/15/2012  . PAF (paroxysmal atrial fibrillation) 03/15/2012  . Anemia of  chronic disease 03/15/2012  . Pleural effusion, L > R 03/04/2012  . Sinus tachycardia 02/28/2012  . Hematuria 02/28/2012  . history of UC (ulcerative colitis) 12/06/2010    End of Session Activity Tolerance: Patient tolerated treatment well General Behavior During Therapy: [facility] for tasks assessed/performed   Shirlean Mylar, OTR/L  08/29/2012, 12:00 PM

## 2012-08-30 ENCOUNTER — Encounter: Payer: Self-pay | Admitting: *Deleted

## 2012-08-30 ENCOUNTER — Encounter: Payer: BC Managed Care – PPO | Attending: General Surgery | Admitting: *Deleted

## 2012-08-30 VITALS — Ht 70.0 in | Wt 138.3 lb

## 2012-08-30 DIAGNOSIS — Z713 Dietary counseling and surveillance: Secondary | ICD-10-CM | POA: Insufficient documentation

## 2012-08-30 DIAGNOSIS — E43 Unspecified severe protein-calorie malnutrition: Secondary | ICD-10-CM | POA: Insufficient documentation

## 2012-08-30 NOTE — Progress Notes (Signed)
Medical Nutrition Therapy:  Appt start time: 3276 end time:  1630.  Assessment:  Primary concern today: Malnutrition and weight loss. Patient is sp recent stroke with severe C. Diff colitis. He underwent a total colectomy with ileostomy. He lost about 50-60 pounds over the course of a long hospital stay. He had a PEG tube placed and received all of his nutrition through that due to swallowing difficulties. He still has PEG in place, but takes all nutrition by mouth. Takes 1 ounce of Prostat (100 kcal, 15 g protein) via tube daily. Tube to be removed soon. He has gained about 10 pounds from lowest weight, but has been maintaining current weight for about a month. He eats 3 regular meals, but is not able to eat between meals due to prolonged satiety, slow food digestion. He can't eat certain foods (foods with skin, raw vegetables) due to ileostomy, and reports taste changes.   MEDICATIONS: Iron, multivitamin, prostat   DIETARY INTAKE:   Usual eating pattern includes 3 meals and 0-1 snacks per day.  24-hr recall:  B (8 AM): 4 slices [place] bacon, 12 oz whole chocolate milk, 8 oz orange juice, white toast with butter and jelly  Snk ( AM): None  L (12-1 PM): Beef/chicken, baked potatoes (butter) OR pizza Snk ( PM): None usually, or crackers D (7 PM): Same as lunch OR stromboli Snk ( PM): None  Beverages: Milk, juice, water, Kool aid  Usual physical activity: PT/OT three times weekly, strength and endurance conditioning  Estimated energy needs: 2200 calories 275 g carbohydrates 110 g protein 73 g fat  Progress Towards Goal(s):  In progress.   Nutritional Diagnosis:  NB-1.1 Food and nutrition-related knowledge deficit As related to high protein/calorie nutrition.  As evidenced by patient with questions regarding increasing intake.    Intervention:  Nutrition counseling. Patient and wife educated on a high calorie/high protein diet. We discussed strategies to increase these in the diet.    Goals:  1. Include an additional 250-500 calories per day.  2. Include at leas 1 small, easy to digest snack per day (pretzels, crackers, peanut butter, juice, Carnation Instant Breakfast).  3. Drink calories (milk, juice, Kool-aid) 4. Focus on protein intake.   Handouts given during visit include:  High protein, high calorie nutrition therapy  Monitoring/Evaluation:  Dietary intake, exercise, and body weight prn.

## 2012-08-31 ENCOUNTER — Ambulatory Visit (HOSPITAL_COMMUNITY)
Admission: RE | Admit: 2012-08-31 | Discharge: 2012-08-31 | Disposition: A | Discharge status: Home / Self Care | Payer: BC Managed Care – PPO | Source: Ambulatory Visit

## 2012-08-31 NOTE — Progress Notes (Signed)
Occupational Therapy Treatment Patient Details  Name: Connor Small MRN: [id number] Date of Birth: Nov 19, 1960  Today's Date: 08/31/2012 Time: 9562-1308 OT Time Calculation (min): 45 min MFR 845-900 15' ES during therex 900-915 15' Theract  915-930 15'  Visit#: 14 of 36  Re-eval: 09/14/12    Authorization: n/a  Authorization Time Period:    Authorization Visit#:   of    Subjective Symptoms/Limitations Symptoms: S: I realy stretched out my hand yesterday. Pain Assessment Currently in Pain?: No/denies  Precautions/Restrictions  Precautions Precautions: Fall  Exercise/Treatments Wrist Exercises Wrist Flexion: PROM;AROM;10 reps Wrist Extension: PROM;AROM;10 reps Wrist Radial Deviation: PROM;AROM;10 reps Wrist Ulnar Deviation: PROM;AROM;10 reps    Hand Exercises MCPJ Flexion: PROM;10 reps MCPJ Extension: PROM;10 reps PIPJ Flexion: PROM;10 reps PIPJ Extension: PROM;10 reps DIPJ Flexion: PROM;10 reps DIPJ Extension: PROM;10 reps Other Hand Exercises: Green eggercizer; squeeze/release during ES Other Hand Exercises: Practied signing name with large built up grip.      Manual Therapy Manual Therapy: Joint mobilization Joint Mobilization: Joint mobilizaton to RUE hand and wrist to increase joint mobility during daily tasks Electrical Stimulation Electrical Stimulation Location: 1) Wrist and digit flexors Electrical Stimulation Action: Doctor, hospital Parameters: Flexors: Cycle 5/5, 29 mA CC 15 minutes. Electrical Stimulation Goals: Neuromuscular facilitation  Occupational Therapy Assessment and Plan OT Assessment and Plan Clinical Impression Statement: A: Patient able to sign name legibily using built up grip on pen. Encouraged patient to purchase built up grip and practice handwriting at home.  OT Plan: P: Continue to work on increase AROM of right hand to increase funcitional mobility.    Goals Short Term Goals Time to Complete Short Term Goals: 6  weeks Short Term Goal 1: Patient will be educated on a HEP. Short Term Goal 2: Patient will weightbear on his RUE while completing ADLs with min pa. Short Term Goal 3: Patient will complete shower transfer with tub transfer bench with SBA. Short Term Goal 4: Patient will don and doff pants with reacher with SBA for safety. Short Term Goal 5: Patient will improve RUE and hand AROM by 25% for increased ability to use RUE as an active assist with daily activities.  Additional Short Term Goals?: Yes Short Term Goal 6: Patient will improve right grip strength by 10 pounds for increased ability to open containers.  Short Term Goal 7: Patient will complete pinch testing and coordination testing.  Long Term Goals Long Term Goal 1: Patient will complete all B/IADLs, work, and leisure activities at the highest level of indepdendence possible, using his right hand as dominant.  Long Term Goal 2: Patient will have WFL AROM in his right arm and hand for increased use with all functional activities.  Long Term Goal 3: Patient will have 25# or more grip strength and 10# or more pinch strength in his right hand for increased ability to complete woodworking and metal working [employer].  Long Term Goal 4: Patient will improve fine motor coordination to be able to manipulate his ostomy and ostomy supplies independently.  Long Term Goal 5: Patient will have WFL strength in his right arm for increased ability to complete household tasks.  Additional Long Term Goals?: Yes Long Term Goal 6: new goal established 08/17/12:  Patient will increase FMC needed to manipulate buttons, coins, etc by completing Nine Hole Peg Test in 40" or less.  Problem List Patient Active Problem List   Diagnosis Date Noted  . 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  . Right hand  weakness 08/10/2012  . Allergic rhinitis 06/28/2012  . Aspiration pneumonitis 04/26/2012  . Clostridium difficile colitis 04/26/2012  . Intra-abdominal  fluid collection/LUQ 04/26/2012  . CKD (chronic kidney disease) stage 2, GFR 60-89 ml/min 04/26/2012  . Acute renal failure 04/26/2012  . Hypokalemia 04/26/2012  . Protein-calorie malnutrition, severe 04/25/2012  . Spleen hematoma - subcapsular & without rupture  04/25/2012  . Ascites s/p US guide paracentesis (4/2) 04/25/2012  . Ileostomy in place 04/24/2012  . HAP (hospital-acquired pneumonia) 04/21/2012  . 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 Acute blood loss anemia 04/18/2012  . S/p total colectomy for toxic megacolon, C diff 03/23/2012  . Hypernatremia, improving 03/23/2012  . Severe muscle deconditioning 03/23/2012  . Shoulder subluxation, right 03/23/2012  . 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 Coagulopathy 03/23/2012  . Quadriparesis 03/17/2012  . Leukocytosis, unspecified 03/15/2012  . Hyperbilirubinemia, due to cholestasis 03/15/2012  . PAF (paroxysmal atrial fibrillation) 03/15/2012  . Anemia of chronic disease 03/15/2012  . Pleural effusion, L > R 03/04/2012  . Sinus tachycardia 02/28/2012  . Hematuria 02/28/2012  . history of UC (ulcerative colitis) 12/06/2010    End of Session Activity Tolerance: Patient tolerated treatment well General Behavior During Therapy: [facility] for tasks assessed/performed   Limmie Patricia, OTR/L,CBIS   08/31/2012, 10:09 AM

## 2012-08-31 NOTE — Progress Notes (Signed)
Physical Therapy Treatment Patient Details  Name: Connor Small MRN: [id number] Date of Birth: [birth year]  Today's Date: 08/31/2012 Time: 4098-1191 PT Time Calculation (min): 41 min Charges:  TE: [phone] Self Care: 1009-1017 Visit#: 14 of 20  Re-eval: 09/14/12 Assessment Diagnosis: CVA Surgical Date: 02/26/12 Next MD Visit: 08/20/12 Prior Therapy: inpatient and HHPT  Subjective: Symptoms/Limitations Symptoms: Pt reports that he feels that he is doing pretty well.  Still limited by activity tolerance.  has difficulty with bending and kneelinh Pain Assessment Currently in Pain?: No/denies  Precautions/Restrictions  Precautions Precautions: Fall  Exercise/Treatments Standing Gait Training: indoors and outdoors x13 minutes 0.27 miles. O2 sats 96-99% HR: 98-102 bom Lunges: Half kneel to stand 3 reps on foam BLE w/UE support  Ankle Exercises - Seated Towel Crunch: 1 rep Other Seated Ankle Exercises: yoga toes: great toe flexion/small toe extension and oppisit x15 each, toe abdcution x10   Physical Therapy Assessment and Plan PT Assessment and Plan Clinical Impression Statement: Pt able to ambulate 0.27 miles   in 13 minutes.  Pt reports that he feels that he back to walking at this PLOF speed.  He continues to be limited by his activity tolerance and had notable fatigue at end of session today. Added ankle exercises today secondary to increased difficulty with with half kneeling activity and was most limited by Rt great toe mobility.   Goals PT Short Term Goals Time to Complete Short Term Goals: 2 weeks PT Short Term Goal 1: Pt will improve his LE strength by one muscle grade in order to tolerate ambualting 5 minutes with LRAD.  PT Short Term Goal 1 - Progress: Met PT Short Term Goal 2: Pt will improve his proprioception and demonstrate static standing balance and demonstrate tandem stance x15 seconds PT Short Term Goal 2 - Progress: Met PT Short Term Goal 3: Pt will complete  the DGI.  PT Short Term Goal 3 - Progress: Met PT Long Term Goals Time to Complete Long Term Goals: 4 weeks PT Long Term Goal 1: Pt will improve his funcitonal strength and demonstrate sit to stand from standard surface without UE assistance.  PT Long Term Goal 1 - Progress: Met PT Long Term Goal 2: Pt wil improve his activity tolerance in order to ambualte x10 minutes with RW for outdoor surfaces and SPC for indoor surface for improved QOL.  Long Term Goal 3: Pt will improve his Berg balance test to 50/56. Long Term Goal 3 Progress: Met Long Term Goal 4: Pt will score greater than a 18/24 on the DGI.  Long Term Goal 4 Progress: Met  Problem List Patient Active Problem List   Diagnosis Date Noted  . 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  . Right hand weakness 08/10/2012  . Allergic rhinitis 06/28/2012  . Aspiration pneumonitis 04/26/2012  . Clostridium difficile colitis 04/26/2012  . Intra-abdominal fluid collection/LUQ 04/26/2012  . CKD (chronic kidney disease) stage 2, GFR 60-89 ml/min 04/26/2012  . Acute renal failure 04/26/2012  . Hypokalemia 04/26/2012  . Protein-calorie malnutrition, severe 04/25/2012  . Spleen hematoma - subcapsular & without rupture  04/25/2012  . Ascites s/p US guide paracentesis (4/2) 04/25/2012  . Ileostomy in place 04/24/2012  . HAP (hospital-acquired pneumonia) 04/21/2012  . 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 Acute blood loss anemia 04/18/2012  . S/p total colectomy for toxic megacolon, C diff 03/23/2012  . Hypernatremia, improving 03/23/2012  . Severe muscle deconditioning 03/23/2012  . Shoulder subluxation, right 03/23/2012  .  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 Coagulopathy 03/23/2012  . Quadriparesis 03/17/2012  . Leukocytosis, unspecified 03/15/2012  . Hyperbilirubinemia, due to cholestasis 03/15/2012  . PAF (paroxysmal atrial fibrillation) 03/15/2012  . Anemia of chronic disease  03/15/2012  . Pleural effusion, L > R 03/04/2012  . Sinus tachycardia 02/28/2012  . Hematuria 02/28/2012  . history of UC (ulcerative colitis) 12/06/2010    General Behavior During Therapy: [facility] for tasks assessed/performed  GP    Cristan Hout, MPT, ATC 08/31/2012, 10:42 AM

## 2012-09-03 ENCOUNTER — Ambulatory Visit (HOSPITAL_COMMUNITY)
Admission: RE | Admit: 2012-09-03 | Discharge: 2012-09-03 | Disposition: A | Discharge status: Home / Self Care | Payer: BC Managed Care – PPO | Source: Ambulatory Visit | Attending: Family Medicine | Admitting: Family Medicine

## 2012-09-03 NOTE — Progress Notes (Signed)
Occupational Therapy Treatment Patient Details  Name: Connor Small MRN: [id number] Date of Birth: March 19, 1960  Today's Date: 09/03/2012 Time: 1020-1103 OT Time Calculation (min): 43 min Manual Therapy 1020-1038 18' Neuro reeducation 1308-6578 25' Visit#: 15 of 36  Re-eval: 09/14/12    Authorization: n/a   Subjective  S: We have been working my hand at home.  My long finger is a bit sore. Pain Assessment Currently in Pain?: No/denies Pain Score: 0-No pain  Precautions/Restrictions   n/a  Exercise/Treatments Hand Exercises MCPJ Flexion: PROM;10 reps MCPJ Extension: PROM;10 reps PIPJ Flexion: PROM;10 reps PIPJ Extension: PROM;10 reps DIPJ Flexion: PROM;10 reps DIPJ Extension: PROM;10 reps Joint Blocking Exercises: 5X PROM and 5X AROM Other Hand Exercises: flex and hold X5" and extend and hold X 5"  Neurological Re-education Exercises Wrist Flexion: PROM;AROM;10 reps Wrist Extension: PROM;AROM;10 reps Wrist Radial Deviation: PROM;AROM;10 reps Wrist Ulnar Deviation: PROM;AROM;10 reps        Manual Therapy Manual Therapy: Myofascial release Myofascial Release: MFR and manual stretching to flexor and extensor forearm, wrist, and hand to decrease pain and fascial restrictions and increase pain free mobility. Activities of Daily Living Activities of Daily Living: Patient used his right hand only to remove all tools from department tool box, organize and then replace.  Patient required rest breaks during activity and had most difficulty with smaller items, such as Connor Small wrenches.    Occupational Therapy Assessment and Plan OT Assessment and Plan Clinical Impression Statement: A:  Patient demonstrated increased volitional wrist extension while reaching for objects in tool box.  Had difficulty picking up smaller items, such as Connor Small wrenchs.   OT Plan: P:  Increase ability to fully flex and extend digits during functional activities.     Goals Short Term Goals Time to  Complete Short Term Goals: 6 weeks Short Term Goal 1: Patient will be educated on a HEP. Short Term Goal 2: Patient will weightbear on his RUE while completing ADLs with min pa. Short Term Goal 3: Patient will complete shower transfer with tub transfer bench with SBA. Short Term Goal 4: Patient will don and doff pants with reacher with SBA for safety. Short Term Goal 5: Patient will improve RUE and hand AROM by 25% for increased ability to use RUE as an active assist with daily activities.  Additional Short Term Goals?: Yes Short Term Goal 6: Patient will improve right grip strength by 10 pounds for increased ability to open containers.  Short Term Goal 6 Progress: Progressing toward goal Short Term Goal 7: Patient will complete pinch testing and coordination testing.  Short Term Goal 7 Progress: Progressing toward goal Long Term Goals Long Term Goal 1: Patient will complete all B/IADLs, work, and leisure activities at the highest level of indepdendence possible, using his right hand as dominant.  Long Term Goal 1 Progress: Progressing toward goal Long Term Goal 2: Patient will have WFL AROM in his right arm and hand for increased use with all functional activities.  Long Term Goal 2 Progress: Progressing toward goal Long Term Goal 3: Patient will have 25# or more grip strength and 10# or more pinch strength in his right hand for increased ability to complete woodworking and metal working [employer].  Long Term Goal 3 Progress: Progressing toward goal Long Term Goal 4: Patient will improve fine motor coordination to be able to manipulate his ostomy and ostomy supplies independently.  Long Term Goal 5: Patient will have WFL strength in his right arm for increased ability to  complete household tasks.  Long Term Goal 5 Progress: Progressing toward goal Additional Long Term Goals?: Yes Long Term Goal 6: new goal established 08/17/12:  Patient will increase FMC needed to manipulate buttons, coins,  etc by completing Nine Hole Peg Test in 40" or less. Long Term Goal 6 Progress: Progressing toward goal  Problem List Patient Active Problem List   Diagnosis Date Noted  . 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  . Right hand weakness 08/10/2012  . Allergic rhinitis 06/28/2012  . Aspiration pneumonitis 04/26/2012  . Clostridium difficile colitis 04/26/2012  . Intra-abdominal fluid collection/LUQ 04/26/2012  . CKD (chronic kidney disease) stage 2, GFR 60-89 ml/min 04/26/2012  . Acute renal failure 04/26/2012  . Hypokalemia 04/26/2012  . Protein-calorie malnutrition, severe 04/25/2012  . Spleen hematoma - subcapsular & without rupture  04/25/2012  . Ascites s/p US guide paracentesis (4/2) 04/25/2012  . Ileostomy in place 04/24/2012  . HAP (hospital-acquired pneumonia) 04/21/2012  . 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 Acute blood loss anemia 04/18/2012  . S/p total colectomy for toxic megacolon, C diff 03/23/2012  . Hypernatremia, improving 03/23/2012  . Severe muscle deconditioning 03/23/2012  . Shoulder subluxation, right 03/23/2012  . 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 Coagulopathy 03/23/2012  . Quadriparesis 03/17/2012  . Leukocytosis, unspecified 03/15/2012  . Hyperbilirubinemia, due to cholestasis 03/15/2012  . PAF (paroxysmal atrial fibrillation) 03/15/2012  . Anemia of chronic disease 03/15/2012  . Pleural effusion, L > R 03/04/2012  . Sinus tachycardia 02/28/2012  . Hematuria 02/28/2012  . history of UC (ulcerative colitis) 12/06/2010    End of Session Activity Tolerance: Patient tolerated treatment well General Behavior During Therapy: [facility] for tasks assessed/performed  GO    Shirlean Mylar, OTR/L  09/03/2012, 10:57 AM

## 2012-09-03 NOTE — Progress Notes (Signed)
Physical Therapy Treatment Patient Details  Name: Connor Small MRN: [id number] Date of Birth: 04-03-1960  Today's Date: 09/03/2012 Time: 9597-4718 PT Time Calculation (min): 40 min Visit#: 15 of 20  Re-eval: 09/14/12 Charges:  gait 1105-1130 (25'), therex 5501-5868 (13')  Subjective: Symptoms/Limitations Symptoms: Pt states he is walking more at home and getting his endurance up.  States he is getting stronger. Pain Assessment Currently in Pain?: No/denies Pain Score: 0-No pain   Exercise/Treatments Standing Stairs: 2 flights with Lt HR reciprocally  SLS: max of 3 trials R: 28"  L:50" Gait Training: indoors and outdoors x16.5 minutes  Other Standing Knee Exercises: kneeling to foam using 1 UE 2X then without UE's 1X each   Physical Therapy Assessment and Plan PT Assessment and Plan Clinical Impression Statement: Pt with improving endurance, able to ambulate nearly 17 minutes before having to rest.  Able to complete stairwell with 1 HR reciprocally without difficulty and complete kneeling to and from floor with supervision. PT Plan: Continue with POC focusing on activity tolerance, dynamic balance and functional independence.     Problem List Patient Active Problem List   Diagnosis Date Noted  . 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  . Right hand weakness 08/10/2012  . Allergic rhinitis 06/28/2012  . Aspiration pneumonitis 04/26/2012  . Clostridium difficile colitis 04/26/2012  . Intra-abdominal fluid collection/LUQ 04/26/2012  . CKD (chronic kidney disease) stage 2, GFR 60-89 ml/min 04/26/2012  . Acute renal failure 04/26/2012  . Hypokalemia 04/26/2012  . Protein-calorie malnutrition, severe 04/25/2012  . Spleen hematoma - subcapsular & without rupture  04/25/2012  . Ascites s/p US guide paracentesis (4/2) 04/25/2012  . Ileostomy in place 04/24/2012  . HAP (hospital-acquired pneumonia) 04/21/2012  . 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  Acute blood loss anemia 04/18/2012  . S/p total colectomy for toxic megacolon, C diff 03/23/2012  . Hypernatremia, improving 03/23/2012  . Severe muscle deconditioning 03/23/2012  . Shoulder subluxation, right 03/23/2012  . 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 Coagulopathy 03/23/2012  . Quadriparesis 03/17/2012  . Leukocytosis, unspecified 03/15/2012  . Hyperbilirubinemia, due to cholestasis 03/15/2012  . PAF (paroxysmal atrial fibrillation) 03/15/2012  . Anemia of chronic disease 03/15/2012  . Pleural effusion, L > R 03/04/2012  . Sinus tachycardia 02/28/2012  . Hematuria 02/28/2012  . history of UC (ulcerative colitis) 12/06/2010    General Behavior During Therapy: [facility] for tasks assessed/performed   Teena Irani, PTA/CLT 09/03/2012, 12:00 PM

## 2012-09-05 ENCOUNTER — Ambulatory Visit (HOSPITAL_COMMUNITY)
Admission: RE | Admit: 2012-09-05 | Discharge: 2012-09-05 | Disposition: A | Discharge status: Home / Self Care | Payer: BC Managed Care – PPO | Source: Ambulatory Visit | Attending: Physical Medicine & Rehabilitation | Admitting: Physical Medicine & Rehabilitation

## 2012-09-05 NOTE — Progress Notes (Signed)
Occupational Therapy Treatment Patient Details  Name: Connor Small MRN: [id number] Date of Birth: Jul 19, 1960  Today's Date: 09/05/2012 Time: 1100-1145 OT Time Calculation (min): 45 min Manual 1100-1120 20' Therex 4098-1191 25'  Visit#: 16 of 36  Re-eval: 09/14/12    Authorization: n/a  Authorization Time Period:    Authorization Visit#:   of    Subjective Symptoms/Limitations Symptoms: S: My hand hurts more lately during the stretching because we're stretching it  Pain Assessment Currently in Pain?: No/denies  Precautions/Restrictions  Precautions Precautions: Fall  Exercise/Treatments Wrist Exercises Wrist Flexion: PROM;AROM;10 reps Wrist Extension: PROM;AROM;10 reps Wrist Radial Deviation: PROM;AROM;10 reps Wrist Ulnar Deviation: PROM;AROM;10 reps  Hand Exercises MCPJ Flexion: PROM;10 reps MCPJ Extension: PROM;10 reps PIPJ Flexion: PROM;10 reps PIPJ Extension: PROM;10 reps DIPJ Flexion: PROM;10 reps DIPJ Extension: PROM;10 reps Joint Blocking Exercises: 5X PROM and 5X AROM Digit Composite Abduction: AROM;5 reps Digit Composite Adduction: AROM;10 reps Theraputty: Flatten;Roll Theraputty - Flatten: yellow Theraputty - Roll: yellow Theraputty - Grip: yellow     Manual Therapy Manual Therapy: Joint mobilization Joint Mobilization: Joint mobilizaton to RUE hand and wrist to increase joint mobility during daily tasks  Occupational Therapy Assessment and Plan OT Assessment and Plan Clinical Impression Statement: A: Patient with increased passive stretching in all digits this date although patient was unable to tolerate increased stretching due to pain. Added theraputty to increase AROM during functional tasks.  OT Plan: P:  Increase ability to fully flex and extend digits during functional activities.     Goals Short Term Goals Time to Complete Short Term Goals: 6 weeks Short Term Goal 1: Patient will be educated on a HEP. Short Term Goal 2: Patient will  weightbear on his RUE while completing ADLs with min pa. Short Term Goal 3: Patient will complete shower transfer with tub transfer bench with SBA. Short Term Goal 4: Patient will don and doff pants with reacher with SBA for safety. Short Term Goal 5: Patient will improve RUE and hand AROM by 25% for increased ability to use RUE as an active assist with daily activities.  Additional Short Term Goals?: Yes Short Term Goal 6: Patient will improve right grip strength by 10 pounds for increased ability to open containers.  Short Term Goal 7: Patient will complete pinch testing and coordination testing.  Long Term Goals Long Term Goal 1: Patient will complete all B/IADLs, work, and leisure activities at the highest level of indepdendence possible, using his right hand as dominant.  Long Term Goal 2: Patient will have WFL AROM in his right arm and hand for increased use with all functional activities.  Long Term Goal 3: Patient will have 25# or more grip strength and 10# or more pinch strength in his right hand for increased ability to complete woodworking and metal working [employer].  Long Term Goal 4: Patient will improve fine motor coordination to be able to manipulate his ostomy and ostomy supplies independently.  Long Term Goal 5: Patient will have WFL strength in his right arm for increased ability to complete household tasks.  Additional Long Term Goals?: Yes Long Term Goal 6: new goal established 08/17/12:  Patient will increase FMC needed to manipulate buttons, coins, etc by completing Nine Hole Peg Test in 40" or less.  Problem List Patient Active Problem List   Diagnosis Date Noted  . 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  . Right hand weakness 08/10/2012  . Allergic rhinitis 06/28/2012  . Aspiration pneumonitis 04/26/2012  . Clostridium  difficile colitis 04/26/2012  . Intra-abdominal fluid collection/LUQ 04/26/2012  . CKD (chronic kidney disease) stage 2, GFR 60-89 ml/min  04/26/2012  . Acute renal failure 04/26/2012  . Hypokalemia 04/26/2012  . Protein-calorie malnutrition, severe 04/25/2012  . Spleen hematoma - subcapsular & without rupture  04/25/2012  . Ascites s/p US guide paracentesis (4/2) 04/25/2012  . Ileostomy in place 04/24/2012  . HAP (hospital-acquired pneumonia) 04/21/2012  . 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 Acute blood loss anemia 04/18/2012  . S/p total colectomy for toxic megacolon, C diff 03/23/2012  . Hypernatremia, improving 03/23/2012  . Severe muscle deconditioning 03/23/2012  . Shoulder subluxation, right 03/23/2012  . 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 Coagulopathy 03/23/2012  . Quadriparesis 03/17/2012  . Leukocytosis, unspecified 03/15/2012  . Hyperbilirubinemia, due to cholestasis 03/15/2012  . PAF (paroxysmal atrial fibrillation) 03/15/2012  . Anemia of chronic disease 03/15/2012  . Pleural effusion, L > R 03/04/2012  . Sinus tachycardia 02/28/2012  . Hematuria 02/28/2012  . history of UC (ulcerative colitis) 12/06/2010    End of Session Activity Tolerance: Patient tolerated treatment well General Behavior During Therapy: [facility] for tasks assessed/performed   Limmie Patricia, OTR/L,CBIS   09/05/2012, 11:57 AM

## 2012-09-05 NOTE — Progress Notes (Signed)
Physical Therapy Treatment Patient Details  Name: Connor Small MRN: [id number] Date of Birth: 06-11-1960  Today's Date: 09/05/2012 Time: 1016-1100 PT Time Calculation (min): 44 min Visit#: 16 of 20  Re-eval: 09/14/12 Charges:  therex 1016-1039 (23'), gait 1044-1100 (16')    Subjective: Symptoms/Limitations Symptoms: Pt reports he is doing well at home.  No functional limitations; feels he is doing all he needs at home.  Pt to discuss with wife. Pain Assessment Currently in Pain?: No/denies   Exercise/Treatments Aerobic Stationary Bike: NuStep x 15 minutes SPM>80 for activity tolerance, seat 8, hills #3, Level 3 Machines for Strengthening Cybex Leg Press: 4PL 15x2 Standing Gait Training: indoors and outdoors x16.5 minutes       Physical Therapy Assessment and Plan PT Assessment and Plan Clinical Impression Statement: Progressing well overall without  functional limitations.  Pt to discuss therapy/goals with wife and determine if there are other activities that may need to be addressed.   PT Plan: Continue with POC focusing on activity tolerance, dynamic balance and functional independence.  Anticipate discharge soon to HEP.     Problem List Patient Active Problem List   Diagnosis Date Noted  . 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  . Right hand weakness 08/10/2012  . Allergic rhinitis 06/28/2012  . Aspiration pneumonitis 04/26/2012  . Clostridium difficile colitis 04/26/2012  . Intra-abdominal fluid collection/LUQ 04/26/2012  . CKD (chronic kidney disease) stage 2, GFR 60-89 ml/min 04/26/2012  . Acute renal failure 04/26/2012  . Hypokalemia 04/26/2012  . Protein-calorie malnutrition, severe 04/25/2012  . Spleen hematoma - subcapsular & without rupture  04/25/2012  . Ascites s/p US guide paracentesis (4/2) 04/25/2012  . Ileostomy in place 04/24/2012  . HAP (hospital-acquired pneumonia) 04/21/2012  . 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  Acute blood loss anemia 04/18/2012  . S/p total colectomy for toxic megacolon, C diff 03/23/2012  . Hypernatremia, improving 03/23/2012  . Severe muscle deconditioning 03/23/2012  . Shoulder subluxation, right 03/23/2012  . 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 Coagulopathy 03/23/2012  . Quadriparesis 03/17/2012  . Leukocytosis, unspecified 03/15/2012  . Hyperbilirubinemia, due to cholestasis 03/15/2012  . PAF (paroxysmal atrial fibrillation) 03/15/2012  . Anemia of chronic disease 03/15/2012  . Pleural effusion, L > R 03/04/2012  . Sinus tachycardia 02/28/2012  . Hematuria 02/28/2012  . history of UC (ulcerative colitis) 12/06/2010      Teena Irani, PTA/CLT 09/05/2012, 11:08 AM

## 2012-09-07 ENCOUNTER — Ambulatory Visit (HOSPITAL_COMMUNITY)
Admission: RE | Admit: 2012-09-07 | Discharge: 2012-09-07 | Disposition: A | Discharge status: Home / Self Care | Payer: BC Managed Care – PPO | Source: Ambulatory Visit | Attending: Physical Medicine & Rehabilitation | Admitting: Physical Medicine & Rehabilitation

## 2012-09-07 NOTE — Progress Notes (Signed)
Physical Therapy Re-evaluation/Treatment  Patient Details  Name: Connor Small MRN: [id number] Date of Birth: [birth year]  Today's Date: 09/07/2012 Time: 1105-1203 PT Time Calculation (min): 58 min Charge: Gait [id number], Self care [phone], Physical performance testings [id number], MMT x 1,               Visit#: 17 of 20  Re-eval: 09/14/12 Assessment Diagnosis: CVA Surgical Date: 02/26/12 Next MD Visit: 10/22/12 Prior Therapy: inpatient and HHPT  Subjective Symptoms/Limitations Symptoms: Pain free today. How long can you sit comfortably?: 30 comfortaby in hard seat How long can you stand comfortably?: 15-20 minutes How long can you walk comfortably?: 16 minutes Pain Assessment Currently in Pain?: No/denies  Precautions/Restrictions  Precautions Precautions: Fall  Objective:   Assessment RLE Strength Right Hip Flexion:  (4+/5 was 4/5) Right Hip Extension: 5/5 (was 4/5) Right Hip ABduction: 5/5 (was 4/5) Right Hip ADduction: 5/5 (was 4/5) Right Knee Flexion:  (4+/5 was 5/5) Right Knee Extension: 5/5 (was 5/5) LLE Strength Left Hip Flexion: 5/5 (was 5/5) Left Hip Extension: 5/5 (was 4/5) Left Hip ABduction: 5/5 (was 4/5) Left Hip ADduction: 5/5 (was 4/5) Left Knee Flexion: 5/5 (was 5/5) Left Knee Extension: 5/5 (was 5/5)  Exercise/Treatments Mobility/Balance  Berg Balance Test Sit to Stand: Able to stand without using hands and stabilize independently Standing Unsupported: Able to stand safely 2 minutes Sitting with Back Unsupported but Feet Supported on Floor or Stool: Able to sit safely and securely 2 minutes Stand to Sit: Sits safely with minimal use of hands Transfers: Able to transfer safely, minor use of hands Standing Unsupported with Eyes Closed: Able to stand 10 seconds safely Standing Ubsupported with Feet Together: Able to place feet together independently and stand 1 minute safely From Standing, Reach Forward with Outstretched Arm: Can reach forward  >12 cm safely (5") (was 3) From Standing Position, Pick up Object from Floor: Able to pick up shoe safely and easily (was 3) From Standing Position, Turn to Look Behind Over each Shoulder: Looks behind from both sides and weight shifts well (was 3) Turn 360 Degrees: Able to turn 360 degrees safely in 4 seconds or less (was 3) Standing Unsupported, Alternately Place Feet on Step/Stool: Able to stand independently and safely and complete 8 steps in 20 seconds Standing Unsupported, One Foot in Front: Able to place foot tandem independently and hold 30 seconds Standing on One Leg: Able to lift leg independently and hold > 10 seconds (was 3) Total Score: 55 Dynamic Gait Index Level Surface: Normal Change in Gait Speed: Normal Gait with Horizontal Head Turns: Normal Gait with Vertical Head Turns: Mild Impairment Gait and Pivot Turn: Normal Step Over Obstacle: Normal Step Around Obstacles: Normal Steps: Normal Total Score: 23   Machines for Strengthening Cybex Knee Extension: 35# at quad machine at [employer] Leg Press: abduction/adduction machines at [facility] 35# 10x  Standing Stairs: 2 flights reciprocal pattern carrying orange weight ball 2RT Gait Training: outdoor gait training to [facility] [phone] through grass, incline/decline, curbs, indoor stairwell reciprocal pattern carrying orange weight ball reciprocal pattern 2RT no handrail  Physical Therapy Assessment and Plan PT Assessment and Plan Clinical Impression Statement: Re-eval complete: Mr Polan has had 17 OPPT sessions over 6 weeks with the following findings:  Pt independent with HEP daily and able to demonstrate appropriate techniques with all exercises.  Pt has met 3/3 STG and 4/4 LTGs.  Pt with increased strength Bil LE to [facility], improved proprioception with gait mechanics and improve BERG balance  score.  Pt able to ambulate for 34 minutes this session on outdoors surfaces with incline/decline slopes, curbs, through grass to the [employer]  with no AD .  Encouraged pt to join the [employer]/continue HEP, pt educated on machines for LE strengthening and aerobic machines for endurance training.  Pt able to complete 2 flights reciprocal pattern carrying weight ball with no handrails. PT Plan: D/C to HEP per all goals met.      Goals Home Exercise Program PT Goal: Perform Home Exercise Program - Progress: Met PT Short Term Goals Time to Complete Short Term Goals: 2 weeks PT Short Term Goal 1: Pt will improve his LE strength by one muscle grade in order to tolerate ambualting 5 minutes with LRAD.  PT Short Term Goal 1 - Progress: Met PT Short Term Goal 2: Pt will improve his proprioception and demonstrate static standing balance and demonstrate tandem stance x15 seconds PT Short Term Goal 2 - Progress: Met PT Short Term Goal 3: Pt will complete the DGI.  PT Short Term Goal 3 - Progress: Met PT Long Term Goals Time to Complete Long Term Goals: 4 weeks PT Long Term Goal 1: Pt will improve his funcitonal strength and demonstrate sit to stand from standard surface without UE assistance.  PT Long Term Goal 1 - Progress: Met PT Long Term Goal 2: Pt wil improve his activity tolerance in order to ambualte x10 minutes with RW for outdoor surfaces and SPC for indoor surface for improved QOL.  PT Long Term Goal 2 - Progress: Met Long Term Goal 3: Pt will improve his Berg balance test to 50/56. Long Term Goal 3 Progress: Met Long Term Goal 4: Pt will score greater than a 18/24 on the DGI.  Long Term Goal 4 Progress: Met  Problem List Patient Active Problem List   Diagnosis Date Noted  . 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  . Right hand weakness 08/10/2012  . Allergic rhinitis 06/28/2012  . Aspiration pneumonitis 04/26/2012  . Clostridium difficile colitis 04/26/2012  . Intra-abdominal fluid collection/LUQ 04/26/2012  . CKD (chronic kidney disease) stage 2, GFR 60-89 ml/min 04/26/2012  . Acute renal failure 04/26/2012  .  Hypokalemia 04/26/2012  . Protein-calorie malnutrition, severe 04/25/2012  . Spleen hematoma - subcapsular & without rupture  04/25/2012  . Ascites s/p US guide paracentesis (4/2) 04/25/2012  . Ileostomy in place 04/24/2012  . HAP (hospital-acquired pneumonia) 04/21/2012  . 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 Acute blood loss anemia 04/18/2012  . S/p total colectomy for toxic megacolon, C diff 03/23/2012  . Hypernatremia, improving 03/23/2012  . Severe muscle deconditioning 03/23/2012  . Shoulder subluxation, right 03/23/2012  . 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 Coagulopathy 03/23/2012  . Quadriparesis 03/17/2012  . Leukocytosis, unspecified 03/15/2012  . Hyperbilirubinemia, due to cholestasis 03/15/2012  . PAF (paroxysmal atrial fibrillation) 03/15/2012  . Anemia of chronic disease 03/15/2012  . Pleural effusion, L > R 03/04/2012  . Sinus tachycardia 02/28/2012  . Hematuria 02/28/2012  . history of UC (ulcerative colitis) 12/06/2010    PT - End of Session Equipment Utilized During Treatment: Gait belt Activity Tolerance: Patient tolerated treatment well General Behavior During Therapy: [facility] for tasks assessed/performed  GP    Juel Burrow 09/07/2012, 1:01 PM  Physician Documentation Your signature is required to indicate approval of the treatment plan as stated above.  Please sign and either send electronically or make a copy of this report for your files  and return this physician signed original.   Please mark one 1.__approve of plan  2. ___approve of plan with the following conditions.   ______________________________                                                          _____________________ Physician Signature                                                                                                             Date

## 2012-09-07 NOTE — Progress Notes (Signed)
Connor Small, MPT, ATC

## 2012-09-07 NOTE — Progress Notes (Signed)
Occupational Therapy Treatment Patient Details  Name: Connor Small MRN: [id number] Date of Birth: 23-Dec-1960  Today's Date: 09/07/2012 Time: 1017-1100 OT Time Calculation (min): 43 min Manual [id number] 28'  Therex 1045-1100 15'  Visit#: 17 of 36  Re-eval: 09/14/12    Authorization: n/a  Authorization Time Period:    Authorization Visit#:   of    Subjective Symptoms/Limitations Symptoms: S: I think my fingers are bending more.  Pain Assessment Currently in Pain?: No/denies  Precautions/Restrictions  Precautions Precautions: Fall  Exercise/Treatments Hand Exercises MCPJ Flexion: PROM;10 reps MCPJ Extension: PROM;10 reps PIPJ Flexion: PROM;10 reps PIPJ Extension: PROM;10 reps DIPJ Flexion: PROM;10 reps DIPJ Extension: PROM;10 reps Joint Blocking Exercises: 5X PROM and 5X AROM Theraputty: Flatten;Roll;Locate Pegs (twisting) Theraputty - Flatten: yellow Theraputty - Roll: yellow Theraputty - Grip: yellow Theraputty - Locate Pegs: 5- yellow Thumb Opposition: thumb to each digit; 5 times each digit        Occupational Therapy Assessment and Plan OT Assessment and Plan Clinical Impression Statement: A: Added locating beads with yellow putty. Scar tissue release in middle finger PIP joint.  OT Plan: P:  Increase ability to fully flex and extend digits during functional activities.     Goals Short Term Goals Time to Complete Short Term Goals: 6 weeks Short Term Goal 1: Patient will be educated on a HEP. Short Term Goal 2: Patient will weightbear on his RUE while completing ADLs with min pa. Short Term Goal 3: Patient will complete shower transfer with tub transfer bench with SBA. Short Term Goal 4: Patient will don and doff pants with reacher with SBA for safety. Short Term Goal 5: Patient will improve RUE and hand AROM by 25% for increased ability to use RUE as an active assist with daily activities.  Additional Short Term Goals?: Yes Short Term Goal 6: Patient will  improve right grip strength by 10 pounds for increased ability to open containers.  Short Term Goal 7: Patient will complete pinch testing and coordination testing.  Long Term Goals Long Term Goal 1: Patient will complete all B/IADLs, work, and leisure activities at the highest level of indepdendence possible, using his right hand as dominant.  Long Term Goal 2: Patient will have WFL AROM in his right arm and hand for increased use with all functional activities.  Long Term Goal 3: Patient will have 25# or more grip strength and 10# or more pinch strength in his right hand for increased ability to complete woodworking and metal working [employer].  Long Term Goal 4: Patient will improve fine motor coordination to be able to manipulate his ostomy and ostomy supplies independently.  Long Term Goal 5: Patient will have WFL strength in his right arm for increased ability to complete household tasks.  Additional Long Term Goals?: Yes Long Term Goal 6: new goal established 08/17/12:  Patient will increase FMC needed to manipulate buttons, coins, etc by completing Nine Hole Peg Test in 40" or less.  Problem List Patient Active Problem List   Diagnosis Date Noted  . 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  . Right hand weakness 08/10/2012  . Allergic rhinitis 06/28/2012  . Aspiration pneumonitis 04/26/2012  . Clostridium difficile colitis 04/26/2012  . Intra-abdominal fluid collection/LUQ 04/26/2012  . CKD (chronic kidney disease) stage 2, GFR 60-89 ml/min 04/26/2012  . Acute renal failure 04/26/2012  . Hypokalemia 04/26/2012  . Protein-calorie malnutrition, severe 04/25/2012  . Spleen hematoma - subcapsular & without rupture  04/25/2012  . Ascites s/p  US guide paracentesis (4/2) 04/25/2012  . Ileostomy in place 04/24/2012  . HAP (hospital-acquired pneumonia) 04/21/2012  . 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 Acute blood loss anemia 04/18/2012  . S/p total colectomy for  toxic megacolon, C diff 03/23/2012  . Hypernatremia, improving 03/23/2012  . Severe muscle deconditioning 03/23/2012  . Shoulder subluxation, right 03/23/2012  . 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 Coagulopathy 03/23/2012  . Quadriparesis 03/17/2012  . Leukocytosis, unspecified 03/15/2012  . Hyperbilirubinemia, due to cholestasis 03/15/2012  . PAF (paroxysmal atrial fibrillation) 03/15/2012  . Anemia of chronic disease 03/15/2012  . Pleural effusion, L > R 03/04/2012  . Sinus tachycardia 02/28/2012  . Hematuria 02/28/2012  . history of UC (ulcerative colitis) 12/06/2010    End of Session Activity Tolerance: Patient tolerated treatment well General Behavior During Therapy: [facility] for tasks assessed/performed   Limmie Patricia, OTR/L,CBIS   09/07/2012, 11:02 AM

## 2012-09-10 ENCOUNTER — Ambulatory Visit (HOSPITAL_COMMUNITY): Payer: BC Managed Care – PPO | Admitting: Specialist

## 2012-09-10 ENCOUNTER — Encounter (INDEPENDENT_AMBULATORY_CARE_PROVIDER_SITE_OTHER): Payer: Self-pay

## 2012-09-10 ENCOUNTER — Ambulatory Visit (HOSPITAL_COMMUNITY)
Admission: RE | Admit: 2012-09-10 | Discharge: 2012-09-10 | Disposition: A | Discharge status: Home / Self Care | Payer: BC Managed Care – PPO | Source: Ambulatory Visit | Attending: Family Medicine | Admitting: Family Medicine

## 2012-09-10 NOTE — Progress Notes (Signed)
Occupational Therapy Treatment Patient Details  Name: Connor Small MRN: [id number] Date of Birth: 04-19-1960  Today's Date: 09/10/2012 Time: 1021-1100 OT Time Calculation (min): 39 min Neuro reeducation 86' Visit#: 18 of 36  Re-eval: 09/14/12     Subjective S:  I went back to church for the first time on Sunday. Pain Assessment Pain Score: 0-No pain  Precautions/Restrictions     Exercise/Treatments Standing External Rotation: 10 reps;Theraband Theraband Level (Shoulder External Rotation): Level 3 (Green) Internal Rotation: 10 reps;Theraband Theraband Level (Shoulder Internal Rotation): Level 3 (Green) Extension: Theraband;10 reps Theraband Level (Shoulder Extension): Level 3 (Green) Row: Theraband;10 reps Theraband Level (Shoulder Row): Level 3 (Green) Retraction: Theraband;10 reps Theraband Level (Shoulder Retraction): Level 3 (Green) ROM / Strengthening / Isometric Strengthening UBE (Upper Arm Bike): 3' and 3' 1.0      Hand Exercises MCPJ Flexion: PROM;10 reps MCPJ Extension: PROM;10 reps PIPJ Flexion: PROM;10 reps PIPJ Extension: PROM;10 reps DIPJ Flexion: PROM;10 reps DIPJ Extension: PROM;10 reps Theraputty - Locate Pegs: 10 - pink Sponges: 3 without cheating, 5 cheating, 5 without cheating Other Hand Exercises: flex and hold X5" and extend and hold X 5" Other Hand Exercises: picked up beads with thumb and opposing digits, alternating from IF thru SF X 2   Neurological Re-education Exercises Wrist Flexion: PROM;AROM;10 reps Wrist Extension: PROM;AROM;10 reps Wrist Radial Deviation: PROM;AROM;10 reps Wrist Ulnar Deviation: PROM;AROM;10 reps Sponges: 3 without cheating, 5 cheating, 5 without cheating  WGrasp and Release Theraputty - Locate Pegs: 10 - pink  Occupational Therapy Assessment and Plan OT Assessment and Plan Clinical Impression Statement: A:  Added UBE and theraband for scapular stability. OT Plan: P:  pick up and manipulate 6 total sponges  without compensating for greater hand strength.   Goals Short Term Goals Time to Complete Short Term Goals: 6 weeks Short Term Goal 1: Patient will be educated on a HEP. Short Term Goal 2: Patient will weightbear on his RUE while completing ADLs with min pa. Short Term Goal 3: Patient will complete shower transfer with tub transfer bench with SBA. Short Term Goal 4: Patient will don and doff pants with reacher with SBA for safety. Short Term Goal 5: Patient will improve RUE and hand AROM by 25% for increased ability to use RUE as an active assist with daily activities.  Additional Short Term Goals?: Yes Short Term Goal 6: Patient will improve right grip strength by 10 pounds for increased ability to open containers.  Short Term Goal 7: Patient will complete pinch testing and coordination testing.  Long Term Goals Long Term Goal 1: Patient will complete all B/IADLs, work, and leisure activities at the highest level of indepdendence possible, using his right hand as dominant.  Long Term Goal 2: Patient will have WFL AROM in his right arm and hand for increased use with all functional activities.  Long Term Goal 3: Patient will have 25# or more grip strength and 10# or more pinch strength in his right hand for increased ability to complete woodworking and metal working [employer].  Long Term Goal 4: Patient will improve fine motor coordination to be able to manipulate his ostomy and ostomy supplies independently.  Long Term Goal 5: Patient will have WFL strength in his right arm for increased ability to complete household tasks.  Additional Long Term Goals?: Yes Long Term Goal 6: new goal established 08/17/12:  Patient will increase FMC needed to manipulate buttons, coins, etc by completing Nine Hole Peg Test in 40" or less.  Problem List Patient Active Problem List   Diagnosis Date Noted  . 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  . Right hand weakness 08/10/2012  . Allergic rhinitis  06/28/2012  . Aspiration pneumonitis 04/26/2012  . Clostridium difficile colitis 04/26/2012  . Intra-abdominal fluid collection/LUQ 04/26/2012  . CKD (chronic kidney disease) stage 2, GFR 60-89 ml/min 04/26/2012  . Acute renal failure 04/26/2012  . Hypokalemia 04/26/2012  . Protein-calorie malnutrition, severe 04/25/2012  . Spleen hematoma - subcapsular & without rupture  04/25/2012  . Ascites s/p US guide paracentesis (4/2) 04/25/2012  . Ileostomy in place 04/24/2012  . HAP (hospital-acquired pneumonia) 04/21/2012  . 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 Acute blood loss anemia 04/18/2012  . S/p total colectomy for toxic megacolon, C diff 03/23/2012  . Hypernatremia, improving 03/23/2012  . Severe muscle deconditioning 03/23/2012  . Shoulder subluxation, right 03/23/2012  . 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 Coagulopathy 03/23/2012  . Quadriparesis 03/17/2012  . Leukocytosis, unspecified 03/15/2012  . Hyperbilirubinemia, due to cholestasis 03/15/2012  . PAF (paroxysmal atrial fibrillation) 03/15/2012  . Anemia of chronic disease 03/15/2012  . Pleural effusion, L > R 03/04/2012  . Sinus tachycardia 02/28/2012  . Hematuria 02/28/2012  . history of UC (ulcerative colitis) 12/06/2010    End of Session Activity Tolerance: Patient tolerated treatment well General Behavior During Therapy: [facility] for tasks assessed/performed  Shirlean Mylar, OTR/L   09/10/2012, 11:02 AM

## 2012-09-12 ENCOUNTER — Ambulatory Visit (HOSPITAL_COMMUNITY)
Admission: RE | Admit: 2012-09-12 | Discharge: 2012-09-12 | Disposition: A | Discharge status: Home / Self Care | Payer: BC Managed Care – PPO | Source: Ambulatory Visit | Attending: Family Medicine | Admitting: Family Medicine

## 2012-09-12 ENCOUNTER — Ambulatory Visit (HOSPITAL_COMMUNITY): Admission: RE | Admit: 2012-09-12 | Payer: BC Managed Care – PPO | Source: Ambulatory Visit

## 2012-09-12 NOTE — Evaluation (Addendum)
Occupational Therapy Re-Evaluation  Patient Details  Name: Connor Small MRN: [id number] Date of Birth: 1960-10-12  Today's Date: 09/12/2012 Time: 1020-1105 OT Time Calculation (min): 45 min Reassess 1020-1105 45'  Visit#: 19 of 36  Re-eval: 10/10/12  Assessment Diagnosis: CVA and critical illness myelopathy  Authorization: n/a  Authorization Time Period:    Authorization Visit#:   of     Past Medical History:  Past Medical History  Diagnosis Date  . Chronic diarrhea   . Rectal bleed   . Hemorrhoids   . Ulcerative colitis     Distal UC over 8 yrs ago diagnosed  . Diverticulitis of large intestine with perforation 10/2011    done at [facility]  . 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 history of UC (ulcerative colitis) 12/06/2010   Past Surgical History:  Past Surgical History  Procedure Laterality Date  . Colonoscopy  9/03  . Sigmoidoscopy       06/13/2002  . Temporary ostomy November 06, 2010      for a colon perforation that was done in [facility] (Dr Ruben Im).    . Colonoscopy  02/16/2011    Procedure: COLONOSCOPY;  Surgeon: Malissa Hippo, MD;  Location: [facility];  Service: Endoscopy;  Laterality: N/A;  100  . Flexible sigmoidoscopy  02/27/2012    Procedure: FLEXIBLE SIGMOIDOSCOPY;  Surgeon: Malissa Hippo, MD;  Location: [facility];  Service: Endoscopy;  Laterality: N/A;  with colonic decompression  . Laparotomy  02/27/2012    Procedure: EXPLORATORY LAPAROTOMY;  Surgeon: Fabio Bering, MD;  Location: [facility];  Service: General;  Laterality: N/A;  . Cecostomy  02/27/2012    Procedure: CECOSTOMY;  Surgeon: Fabio Bering, MD;  Location: [facility];  Service: General;  Laterality: N/A;  Cecostomy Tube Placement  . Colectomy  02/28/2012    Procedure: TOTAL COLECTOMY;  Surgeon: Fabio Bering, MD;  Location: [facility];  Service: General;  Laterality: N/A;  . Ileostomy  02/28/2012    Procedure: ILEOSTOMY;  Surgeon: Fabio Bering, MD;  Location: [facility];  Service: General;  Laterality:  N/A;  . Cystoscopy w/ ureteral stent placement Bilateral 03/20/2012    Procedure: CYSTOSCOPY WITH RETROGRADE PYELOGRAM/URETERAL STENT PLACEMENT;  Surgeon: Sebastian Ache, MD;  Location: [facility] OR;  Service: Urology;  Laterality: Bilateral;  . Laparotomy N/A 03/26/2012    Procedure: EXPLORATORY LAPAROTOMY;  Surgeon: Mariella Saa, MD;  Location: [facility] OR;  Service: General;  Laterality: N/A;  . Application of wound vac N/A 03/26/2012    Procedure: APPLICATION OF WOUND VAC;  Surgeon: Mariella Saa, MD;  Location: [facility] OR;  Service: General;  Laterality: N/A;  . Liver biopsy N/A 03/26/2012    Procedure: LIVER BIOPSY;  Surgeon: Mariella Saa, MD;  Location: [facility] OR;  Service: General;  Laterality: N/A;  . Laparotomy N/A 03/29/2012    Procedure: EXPLORATORY LAPAROTOMY, PARTIAL WOUND CLOSURE;  Surgeon: Mariella Saa, MD;  Location: [facility] OR;  Service: General;  Laterality: N/A;  . Vacuum assisted closure change N/A 03/29/2012    Procedure: Open ABDOMINAL VACUUM  CHANGE;  Surgeon: Mariella Saa, MD;  Location: [facility] OR;  Service: General;  Laterality: N/A;  . Vacuum assisted closure change N/A 04/01/2012    Procedure: removal of abdominal vac dressing and abdominal closure;  Surgeon: Mariella Saa, MD;  Location: [facility] OR;  Service: General;  Laterality: N/A;    Subjective Symptoms/Limitations Symptoms: S: I haven't been working on my handwriting at home. We did  buy a grip for the pencil for me to use. Pain Assessment Currently in Pain?: No/denies  Precautions/Restrictions  Precautions Precautions: Fall   Assessment Coordination 9 hole peg test: Left: 43.6' (last progress note: Left 1'06") Additional Assessments RUE AROM (degrees) RUE Overall AROM Comments: assessed in seated Right Shoulder Flexion: 115 Degrees (last progress note: 130) Right Shoulder ABduction: 97 Degrees (last progress note: 95) Right Shoulder Internal Rotation: 90 Degrees (last progress note: 85) Right Shoulder  External Rotation: 90 Degrees (last progress note: 65) Right Elbow Flexion: 143 (last progress note: 143) Right Elbow Extension:  (lacks 15 degress from 0. (Last progress note: -30 degrees.) Right Forearm Pronation: 90 Degrees (last progress note: 85) Right Forearm Supination: 80 Degrees (last progress note: 70) Right Wrist Extension: 15 Degrees (last progress note: 10) Right Wrist Flexion: 60 Degrees (last progress note: 60) RUE Strength Grip (lbs): 24 (last progress note: 18) Right Hand AROM:  Digit Flexion: T 22/36, I 68/36/0 L 76/0/0 R 70/32/0 S 62/60/0 Digit Extension: T 0/0 I 52/12/0 L 56/0/0 R 44/2/0 S 44/32/0 MMT Wrist Extension 4/5 Flexion 5/5      Occupational Therapy Assessment and Plan OT Assessment and Plan Clinical Impression Statement: A: Reassessment completed today. patient has met all short term goals and is progressing towards long term goals. Patient is making great progress towards goals. Educated patient to work on Chiropractor at home and weightbearing on palm of hand to stretch digits and using putty while trying to make a fist OT Frequency: Min 3X/week OT Plan: P: Cont. therapy 3x week and reassess in 4 weeks.  Pick up and manipulate 6 total sponges without compensating for greater hand strength.   Goals Short Term Goals Time to Complete Short Term Goals: 6 weeks Short Term Goal 1: Patient will be educated on a HEP. Short Term Goal 2: Patient will weightbear on his RUE while completing ADLs with min pa. Short Term Goal 3: Patient will complete shower transfer with tub transfer bench with SBA. Short Term Goal 4: Patient will don and doff pants with reacher with SBA for safety. Short Term Goal 5: Patient will improve RUE and hand AROM by 25% for increased ability to use RUE as an active assist with daily activities.  Additional Short Term Goals?: Yes Short Term Goal 6: Patient will improve right grip strength by 10 pounds for increased ability to open containers.   Short Term Goal 6 Progress: Met Short Term Goal 7: Patient will complete pinch testing and coordination testing.  Short Term Goal 7 Progress: Met Long Term Goals Long Term Goal 1: Patient will complete all B/IADLs, work, and leisure activities at the highest level of indepdendence possible, using his right hand as dominant.  Long Term Goal 1 Progress: Progressing toward goal Long Term Goal 2: Patient will have WFL AROM in his right arm and hand for increased use with all functional activities.  Long Term Goal 2 Progress: Progressing toward goal Long Term Goal 3: Patient will have 25# or more grip strength and 10# or more pinch strength in his right hand for increased ability to complete woodworking and metal working [employer].  Long Term Goal 3 Progress: Progressing toward goal Long Term Goal 4: Patient will improve fine motor coordination to be able to manipulate his ostomy and ostomy supplies independently.  Long Term Goal 5: Patient will have WFL strength in his right arm for increased ability to complete household tasks.  Long Term Goal 5 Progress: Progressing toward goal Additional Long  Term Goals?: Yes Long Term Goal 6: new goal established 08/17/12:  Patient will increase FMC needed to manipulate buttons, coins, etc by completing Nine Hole Peg Test in 40" or less. Long Term Goal 6 Progress: Progressing toward goal  Problem List Patient Active Problem List   Diagnosis Date Noted  . 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  . Right hand weakness 08/10/2012  . Allergic rhinitis 06/28/2012  . Aspiration pneumonitis 04/26/2012  . Clostridium difficile colitis 04/26/2012  . Intra-abdominal fluid collection/LUQ 04/26/2012  . CKD (chronic kidney disease) stage 2, GFR 60-89 ml/min 04/26/2012  . Acute renal failure 04/26/2012  . Hypokalemia 04/26/2012  . Protein-calorie malnutrition, severe 04/25/2012  . Spleen hematoma - subcapsular & without rupture  04/25/2012  . Ascites s/p US  guide paracentesis (4/2) 04/25/2012  . Ileostomy in place 04/24/2012  . HAP (hospital-acquired pneumonia) 04/21/2012  . 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 Acute blood loss anemia 04/18/2012  . S/p total colectomy for toxic megacolon, C diff 03/23/2012  . Hypernatremia, improving 03/23/2012  . Severe muscle deconditioning 03/23/2012  . Shoulder subluxation, right 03/23/2012  . 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 Coagulopathy 03/23/2012  . Quadriparesis 03/17/2012  . Leukocytosis, unspecified 03/15/2012  . Hyperbilirubinemia, due to cholestasis 03/15/2012  . PAF (paroxysmal atrial fibrillation) 03/15/2012  . Anemia of chronic disease 03/15/2012  . Pleural effusion, L > R 03/04/2012  . Sinus tachycardia 02/28/2012  . Hematuria 02/28/2012  . history of UC (ulcerative colitis) 12/06/2010    End of Session Activity Tolerance: Patient tolerated treatment well General Behavior During Therapy: [facility] for tasks assessed/performed   Limmie Patricia, OTR/L,CBIS   09/12/2012, 12:13 PM  Physician Documentation Your signature is required to indicate approval of the treatment plan as stated above.  Please sign and either send electronically or make a copy of this report for your files and return this physician signed original.  Please mark one 1.__approve of plan  2. ___approve of plan with the following conditions.   ______________________________                                                          _____________________ Physician Signature                                                                                                             Date

## 2012-09-14 ENCOUNTER — Ambulatory Visit (HOSPITAL_COMMUNITY): Payer: BC Managed Care – PPO | Admitting: Physical Therapy

## 2012-09-17 ENCOUNTER — Ambulatory Visit (HOSPITAL_COMMUNITY)
Admission: RE | Admit: 2012-09-17 | Discharge: 2012-09-17 | Disposition: A | Discharge status: Home / Self Care | Payer: BC Managed Care – PPO | Source: Ambulatory Visit | Attending: Family Medicine | Admitting: Family Medicine

## 2012-09-17 NOTE — Progress Notes (Signed)
Occupational Therapy Treatment Patient Details  Name: Connor Small MRN: [id number] Date of Birth: [birth year]  Today's Date: 09/17/2012 Time: 1100-1151 OT Time Calculation (min): 51 min Neuro reeducation 55' Visit#: 20 of 36  Re-eval: 10/10/12     Subjective S:  I drove today, its going pretty well. Pain Assessment Currently in Pain?: No/denies  Precautions/Restrictions   n/a  Exercise/Treatments ROM / Strengthening / Isometric Strengthening UBE (Upper Arm Bike): 3' and 3' 2.0   Neurological Re-education Development of Reach  Reaching to Shoulder Height: sitting at table patient removed 25 pegs and placed on corresponding color target without difficulty.  Patient then picked up pegs and manipulated them in order to place back into the pegboard.  He then removed pegs and translated from fingertips to palm and able to hold 5 pegs at one time.  patient required vg to maintain wrist in neutral  or slightly extended.    Grasp and Release    Fine Motor Coordination Large Pegboard: crumpled 4 pieces of papertowel with difficulty and extended time to complete.     Activities of Daily Living Activities of Daily Living: Patient wrote his name, address, phone, and DOB with his right hand with good- legibility.  He feels he has a long way to go.  Copied 3 pattern lines for increased wrist movement.  Patients difficulty lies in flexing his small and ring finger enough to allow space for the pencil.   Occupational Therapy Assessment and Plan OT Assessment and Plan Clinical Impression Statement: A:  Required vg to maintain neutral/extended wrist during reaching activities,   OT Plan: P:  Increase independence with wrist extension during functional reaching.    Goals Short Term Goals Time to Complete Short Term Goals: 6 weeks Short Term Goal 1: Patient will be educated on a HEP. Short Term Goal 2: Patient will weightbear on his RUE while completing ADLs with min pa. Short Term Goal 3:  Patient will complete shower transfer with tub transfer bench with SBA. Short Term Goal 4: Patient will don and doff pants with reacher with SBA for safety. Short Term Goal 5: Patient will improve RUE and hand AROM by 25% for increased ability to use RUE as an active assist with daily activities.  Additional Short Term Goals?: Yes Short Term Goal 6: Patient will improve right grip strength by 10 pounds for increased ability to open containers.  Short Term Goal 7: Patient will complete pinch testing and coordination testing.  Long Term Goals Long Term Goal 1: Patient will complete all B/IADLs, work, and leisure activities at the highest level of indepdendence possible, using his right hand as dominant.  Long Term Goal 2: Patient will have WFL AROM in his right arm and hand for increased use with all functional activities.  Long Term Goal 3: Patient will have 25# or more grip strength and 10# or more pinch strength in his right hand for increased ability to complete woodworking and metal working [employer].  Long Term Goal 4: Patient will improve fine motor coordination to be able to manipulate his ostomy and ostomy supplies independently.  Long Term Goal 5: Patient will have WFL strength in his right arm for increased ability to complete household tasks.  Additional Long Term Goals?: Yes Long Term Goal 6: new goal established 08/17/12:  Patient will increase FMC needed to manipulate buttons, coins, etc by completing Nine Hole Peg Test in 40" or less.  Problem List Patient Active Problem List   Diagnosis Date Noted  .  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  . Right hand weakness 08/10/2012  . Allergic rhinitis 06/28/2012  . Aspiration pneumonitis 04/26/2012  . Clostridium difficile colitis 04/26/2012  . Intra-abdominal fluid collection/LUQ 04/26/2012  . CKD (chronic kidney disease) stage 2, GFR 60-89 ml/min 04/26/2012  . Acute renal failure 04/26/2012  . Hypokalemia 04/26/2012  .  Protein-calorie malnutrition, severe 04/25/2012  . Spleen hematoma - subcapsular & without rupture  04/25/2012  . Ascites s/p US guide paracentesis (4/2) 04/25/2012  . Ileostomy in place 04/24/2012  . HAP (hospital-acquired pneumonia) 04/21/2012  . 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 Acute blood loss anemia 04/18/2012  . S/p total colectomy for toxic megacolon, C diff 03/23/2012  . Hypernatremia, improving 03/23/2012  . Severe muscle deconditioning 03/23/2012  . Shoulder subluxation, right 03/23/2012  . 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 Coagulopathy 03/23/2012  . Quadriparesis 03/17/2012  . Leukocytosis, unspecified 03/15/2012  . Hyperbilirubinemia, due to cholestasis 03/15/2012  . PAF (paroxysmal atrial fibrillation) 03/15/2012  . Anemia of chronic disease 03/15/2012  . Pleural effusion, L > R 03/04/2012  . Sinus tachycardia 02/28/2012  . Hematuria 02/28/2012  . history of UC (ulcerative colitis) 12/06/2010    End of Session Activity Tolerance: Patient tolerated treatment well General Behavior During Therapy: [facility] for tasks assessed/performed  GO    Shirlean Mylar, OTR/L  09/17/2012, 12:30 PM

## 2012-09-19 ENCOUNTER — Ambulatory Visit (HOSPITAL_COMMUNITY)
Admission: RE | Admit: 2012-09-19 | Discharge: 2012-09-19 | Disposition: A | Discharge status: Home / Self Care | Payer: BC Managed Care – PPO | Source: Ambulatory Visit | Attending: Family Medicine | Admitting: Family Medicine

## 2012-09-19 NOTE — Progress Notes (Signed)
Occupational Therapy Treatment Patient Details  Name: Connor Small MRN: [id number] Date of Birth: 1960-03-04  Today's Date: 09/19/2012 Time: 1105-1150 OT Time Calculation (min): 45 min NM re-ed 1105-1150 45'  Visit#: 21 of 36  Re-eval: 10/10/12    Authorization: n/a  Authorization Time Period:    Authorization Visit#:   of    Subjective Symptoms/Limitations Symptoms: S: I think my hand is moving a little more. My middle finger is still being difficult.  Pain Assessment Currently in Pain?: No/denies  Precautions/Restrictions  Precautions Precautions: None  Exercise/Treatments Hand Exercises Theraputty: Flatten (green - twisting wrist flexion/extension) Theraputty - Flatten: red - standing Theraputty - Roll: red Theraputty - Grip: red Sponges: 3,5,6     Weight Bearing Technique Weight Bearing Technique: Yes RUE Weight Bearing Technique: Extended arm standing Response to Weight Bearing Technique: Weightbearing with putty on table top to achieve wrist extension and flexion. Tolerated 5-10'  Occupational Therapy Assessment and Plan OT Assessment and Plan Clinical Impression Statement: A: Patient was able to grasp 6 sponges this date with max cueing for technique from therapist. Used green theraputty to perform twisting. OT Plan: P: Cont to work on increase wrist flexion and extension. Patient would like to have electrical stimulation performed next tx sesion.    Goals Short Term Goals Time to Complete Short Term Goals: 6 weeks Short Term Goal 1: Patient will be educated on a HEP. Short Term Goal 2: Patient will weightbear on his RUE while completing ADLs with min pa. Short Term Goal 3: Patient will complete shower transfer with tub transfer bench with SBA. Short Term Goal 4: Patient will don and doff pants with reacher with SBA for safety. Short Term Goal 5: Patient will improve RUE and hand AROM by 25% for increased ability to use RUE as an active assist with daily  activities.  Additional Short Term Goals?: Yes Short Term Goal 6: Patient will improve right grip strength by 10 pounds for increased ability to open containers.  Short Term Goal 7: Patient will complete pinch testing and coordination testing.  Long Term Goals Long Term Goal 1: Patient will complete all B/IADLs, work, and leisure activities at the highest level of indepdendence possible, using his right hand as dominant.  Long Term Goal 1 Progress: Progressing toward goal Long Term Goal 2: Patient will have WFL AROM in his right arm and hand for increased use with all functional activities.  Long Term Goal 2 Progress: Progressing toward goal Long Term Goal 3: Patient will have 25# or more grip strength and 10# or more pinch strength in his right hand for increased ability to complete woodworking and metal working [employer].  Long Term Goal 3 Progress: Progressing toward goal Long Term Goal 4: Patient will improve fine motor coordination to be able to manipulate his ostomy and ostomy supplies independently.  Long Term Goal 5: Patient will have WFL strength in his right arm for increased ability to complete household tasks.  Long Term Goal 5 Progress: Progressing toward goal Additional Long Term Goals?: Yes Long Term Goal 6: new goal established 08/17/12:  Patient will increase FMC needed to manipulate buttons, coins, etc by completing Nine Hole Peg Test in 40" or less. Long Term Goal 6 Progress: Progressing toward goal  Problem List Patient Active Problem List   Diagnosis Date Noted  . 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  . Right hand weakness 08/10/2012  . Allergic rhinitis 06/28/2012  . Aspiration pneumonitis 04/26/2012  . Clostridium difficile colitis  04/26/2012  . Intra-abdominal fluid collection/LUQ 04/26/2012  . CKD (chronic kidney disease) stage 2, GFR 60-89 ml/min 04/26/2012  . Acute renal failure 04/26/2012  . Hypokalemia 04/26/2012  . Protein-calorie malnutrition,  severe 04/25/2012  . Spleen hematoma - subcapsular & without rupture  04/25/2012  . Ascites s/p US guide paracentesis (4/2) 04/25/2012  . Ileostomy in place 04/24/2012  . HAP (hospital-acquired pneumonia) 04/21/2012  . 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 Acute blood loss anemia 04/18/2012  . S/p total colectomy for toxic megacolon, C diff 03/23/2012  . Hypernatremia, improving 03/23/2012  . Severe muscle deconditioning 03/23/2012  . Shoulder subluxation, right 03/23/2012  . 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 Coagulopathy 03/23/2012  . Quadriparesis 03/17/2012  . Leukocytosis, unspecified 03/15/2012  . Hyperbilirubinemia, due to cholestasis 03/15/2012  . PAF (paroxysmal atrial fibrillation) 03/15/2012  . Anemia of chronic disease 03/15/2012  . Pleural effusion, L > R 03/04/2012  . Sinus tachycardia 02/28/2012  . Hematuria 02/28/2012  . history of UC (ulcerative colitis) 12/06/2010    End of Session Activity Tolerance: Patient tolerated treatment well General Behavior During Therapy: [facility] for tasks assessed/performed   Limmie Patricia, OTR/L,CBIS   09/19/2012, 11:57 AM

## 2012-09-21 ENCOUNTER — Ambulatory Visit (HOSPITAL_COMMUNITY)
Admission: RE | Admit: 2012-09-21 | Discharge: 2012-09-21 | Disposition: A | Discharge status: Home / Self Care | Payer: BC Managed Care – PPO | Source: Ambulatory Visit | Attending: Family Medicine | Admitting: Family Medicine

## 2012-09-21 NOTE — Progress Notes (Signed)
Occupational Therapy Treatment Patient Details  Name: Connor Small MRN: [id number] Date of Birth: [birth year]  Today's Date: 09/21/2012 Time: 4098-1191 OT Time Calculation (min): 53 min Self Care 10' Neuro reed 20' Therapeutic Ex 23' Visit#: 22 of 36  Re-eval: 10/10/12    Authorization: n/a  Authorization Time Period:    Authorization Visit#:   of    Subjective Symptoms/Limitations Symptoms: S:  I used my plasma cutter the other day.  It is really just this middle finger that gives me a hard time.  Pain Assessment Currently in Pain?: No/denies Pain Score: 0-No pain  Precautions/Restrictions     Exercise/Treatments SStanding External Rotation: 15 reps;Theraband Theraband Level (Shoulder External Rotation): Level 3 (Green) Internal Rotation: 15 reps;Theraband Theraband Level (Shoulder Internal Rotation): Level 3 (Green) Extension: Theraband;15 reps Theraband Level (Shoulder Extension): Level 3 (Green) Row: Theraband;15 reps Theraband Level (Shoulder Row): Level 3 (Green) Retraction: Theraband;15 reps Theraband Level (Shoulder Retraction): Level 3 (Green) ROM / Strengthening / Isometric Strengthening UBE (Upper Arm Bike): 3' and 3' 2.0   Hand Exercises Theraputty: Roll;Grip;Locate Pegs Theraputty - Roll: red Theraputty - Grip: red Theraputty - Locate Pegs: 10 - pink Thumb Opposition: thumb to each digit; 5 times each digit Hand Gripper with Large Beads: attempted large beads, unable to squeeze, was able to complete with large sponge squares X 10 Sponges: 6, 7, 8     Activities of Daily Living Activities of Daily Living: Used small scissors to trim coupons with good control and use of scissors.  used ladybug pencil grip to write name, address, phone and date of birth wtih good + legibility.  Repeated without grip, and it was evident that the grip did assist with legibility and control of pencil.   Occupational Therapy Assessment and Plan OT Assessment and  Plan Clinical Impression Statement: A:  Able to use small scissors with right hand to clip coupons.  handwriting improved with use of pencil gripper.  OT Plan: P:  Add estim and manual therapy for increased functional grasp.   Goals Short Term Goals Time to Complete Short Term Goals: 6 weeks Short Term Goal 1: Patient will be educated on a HEP. Short Term Goal 2: Patient will weightbear on his RUE while completing ADLs with min pa. Short Term Goal 3: Patient will complete shower transfer with tub transfer bench with SBA. Short Term Goal 4: Patient will don and doff pants with reacher with SBA for safety. Short Term Goal 5: Patient will improve RUE and hand AROM by 25% for increased ability to use RUE as an active assist with daily activities.  Additional Short Term Goals?: Yes Short Term Goal 6: Patient will improve right grip strength by 10 pounds for increased ability to open containers.  Short Term Goal 7: Patient will complete pinch testing and coordination testing.  Long Term Goals Long Term Goal 1: Patient will complete all B/IADLs, work, and leisure activities at the highest level of indepdendence possible, using his right hand as dominant.  Long Term Goal 2: Patient will have WFL AROM in his right arm and hand for increased use with all functional activities.  Long Term Goal 3: Patient will have 25# or more grip strength and 10# or more pinch strength in his right hand for increased ability to complete woodworking and metal working [employer].  Long Term Goal 4: Patient will improve fine motor coordination to be able to manipulate his ostomy and ostomy supplies independently.  Long Term Goal 5: Patient will have [facility]  strength in his right arm for increased ability to complete household tasks.  Additional Long Term Goals?: Yes Long Term Goal 6: new goal established 08/17/12:  Patient will increase FMC needed to manipulate buttons, coins, etc by completing Nine Hole Peg Test in 40" or  less.  Problem List Patient Active Problem List   Diagnosis Date Noted  . 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  . Right hand weakness 08/10/2012  . Allergic rhinitis 06/28/2012  . Aspiration pneumonitis 04/26/2012  . Clostridium difficile colitis 04/26/2012  . Intra-abdominal fluid collection/LUQ 04/26/2012  . CKD (chronic kidney disease) stage 2, GFR 60-89 ml/min 04/26/2012  . Acute renal failure 04/26/2012  . Hypokalemia 04/26/2012  . Protein-calorie malnutrition, severe 04/25/2012  . Spleen hematoma - subcapsular & without rupture  04/25/2012  . Ascites s/p US guide paracentesis (4/2) 04/25/2012  . Ileostomy in place 04/24/2012  . HAP (hospital-acquired pneumonia) 04/21/2012  . 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 Acute blood loss anemia 04/18/2012  . S/p total colectomy for toxic megacolon, C diff 03/23/2012  . Hypernatremia, improving 03/23/2012  . Severe muscle deconditioning 03/23/2012  . Shoulder subluxation, right 03/23/2012  . 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 Coagulopathy 03/23/2012  . Quadriparesis 03/17/2012  . Leukocytosis, unspecified 03/15/2012  . Hyperbilirubinemia, due to cholestasis 03/15/2012  . PAF (paroxysmal atrial fibrillation) 03/15/2012  . Anemia of chronic disease 03/15/2012  . Pleural effusion, L > R 03/04/2012  . Sinus tachycardia 02/28/2012  . Hematuria 02/28/2012  . history of UC (ulcerative colitis) 12/06/2010    End of Session Activity Tolerance: Patient tolerated treatment well General Behavior During Therapy: [facility] for tasks assessed/performed  GO    Shirlean Mylar, OTR/L  09/21/2012, 11:39 AM

## 2012-09-25 ENCOUNTER — Ambulatory Visit (INDEPENDENT_AMBULATORY_CARE_PROVIDER_SITE_OTHER): Payer: BC Managed Care – PPO | Admitting: Internal Medicine

## 2012-09-25 ENCOUNTER — Encounter (INDEPENDENT_AMBULATORY_CARE_PROVIDER_SITE_OTHER): Payer: Self-pay | Admitting: Internal Medicine

## 2012-09-25 VITALS — BP 90/62 | HR 96 | Temp 98.0°F | Resp 18 | Ht 70.0 in | Wt 141.8 lb

## 2012-09-25 DIAGNOSIS — D649 Anemia, unspecified: Secondary | ICD-10-CM

## 2012-09-25 DIAGNOSIS — R7989 Other specified abnormal findings of blood chemistry: Secondary | ICD-10-CM

## 2012-09-25 DIAGNOSIS — R634 Abnormal weight loss: Secondary | ICD-10-CM

## 2012-09-25 DIAGNOSIS — R799 Abnormal finding of blood chemistry, unspecified: Secondary | ICD-10-CM

## 2012-09-25 DIAGNOSIS — G4700 Insomnia, unspecified: Secondary | ICD-10-CM

## 2012-09-25 MED ORDER — CLONAZEPAM 0.5 MG PO TABS: 0.2500 mg | ORAL_TABLET | Freq: Every day | ORAL | Status: DC

## 2012-09-25 NOTE — Progress Notes (Signed)
Presenting complaint;  Follow for anemia and weight loss. Patient wants PEG tube removed.  Subjective:  Patient is 52 year old Caucasian male who is here for scheduled visit accompanied by his wife. He was last seen on 08/01/2012 for gross hematuria and flank pain. He was found to have a right-sided hydronephrosis. He also had elevated serum creatinine. He was evaluated by urologist and discharged. He was felt to have urolithiasis. He has not experience any more episodes of pain or hematuria. He continues to feel better. He is getting occupational therapy. He is not able to move his right upper extremity with more ease and is able to write some. He is anxious to return to work. He also wants to proceed with surgery so that he he will not have ileostomy anymore. His wife states he is managing the ileostomy himself. His appetite is getting better. He feels his face spots are still not right. He is using PEG tube only for multivitamin and once his tube removed. He has gained 2 pounds since his last visit.  Current Medications: Current Outpatient Prescriptions  Medication Sig Dispense Refill  . aspirin 81 MG chewable tablet Place 1 tablet (81 mg total) into feeding tube daily.      . clonazePAM (KLONOPIN) 0.5 MG tablet Take 1 tablet (0.5 mg total) by mouth at bedtime. For anxiety/sleep  30 tablet  1  . IRON PO Take 325 mg by mouth daily.       . Multiple Vitamin (MULTIVITAMIN) LIQD Take 5 mLs by mouth daily.      . traMADol (ULTRAM) 50 MG tablet Place 1 tablet (50 mg total) into feeding tube every 6 (six) hours as needed. For pain  60 tablet  1   No current facility-administered medications for this visit.     Objective: Blood pressure 90/62, pulse 96, temperature 98 F (36.7 C), temperature source Oral, resp. rate 18, height 5' 10"  (1.778 m), weight 141 lb 12.8 oz (64.32 kg). Patient is alert and in no acute distress. He is able to move from chair to examination table without any  difficulty. Conjunctiva is pink. Sclera is nonicteric Oropharyngeal mucosa is normal. No neck masses or thyromegaly noted. Cardiac exam with regular rhythm normal S1 and S2. No murmur or gallop noted. Lungs are clear to auscultation. Abdomen is flat with the ileostomy and PEG tube in place. There is scant amount of stool in the ileostomy bag. Abdomen is soft and nontender without organomegaly or masses. No LE edema or clubbing noted. Right grip is weak and he has limited flexion to metacarpal phalangeal proximal and distal interphalangeal joints.  Labs/studies Results: Lab data from 08/01/2012 H&H 9. 5 and 29.2 CO2 was 18, BUN 33 and creatinine 2.28 Serum calcium was 10.9. Lab data from 08/08/2012. BUN 29 and creatinine 1.34 Calcium 10.6. Lab data from 08/22/2012. BUN 27 and creatinine 1.63 Serum calcium 10 point    Assessment:  #1. Right hydronephrosis secondary to urolithiasis it appears he has passed a stone. #2. Elevated serum creatinine secondary to #1. Expect to see normalization of renal function. #3. Anemia. Secondary to prolonged illness resulting from fulminant C. difficile  resulting in multisystem failure and subtotal colectomy. #4. Patient has PEG tube which he does not need any more. #5. Right upper extremity weakness. Continued improvement with occupational therapy and recovery of function. #6. History of ulcerative colitis. Status post subtotal colectomy in February 2014 for fulminant C. Difficile colitis. Patient is candidate for IPAA but he should wait until he  is fully recovered from recent life-threatening illness. He can discuss this further on his next visit with Dr. Adonis Housekeeper.   Plan:  Patient will go to lab for CBC and metabolic 7. Patient has traction pull type gastrostomy tube which was removed at bedside and sterile dressing applied. Patient advised to keep the site covered until it is healed. Clonazepam change to 0.25 mg by mouth each  bedtime. Office visit in 4 months.

## 2012-09-25 NOTE — Patient Instructions (Signed)
Clean G-tube site covered until healed. Physician will contact you with results of blood work.

## 2012-09-26 ENCOUNTER — Ambulatory Visit (HOSPITAL_COMMUNITY)
Admission: RE | Admit: 2012-09-26 | Discharge: 2012-09-26 | Disposition: A | Discharge status: Home / Self Care | Payer: BC Managed Care – PPO | Source: Ambulatory Visit | Attending: Physical Medicine & Rehabilitation | Admitting: Physical Medicine & Rehabilitation

## 2012-09-26 DIAGNOSIS — M6281 Muscle weakness (generalized): Secondary | ICD-10-CM | POA: Insufficient documentation

## 2012-09-26 DIAGNOSIS — I69998 Other sequelae following unspecified cerebrovascular disease: Secondary | ICD-10-CM | POA: Insufficient documentation

## 2012-09-26 DIAGNOSIS — IMO0001 Reserved for inherently not codable concepts without codable children: Secondary | ICD-10-CM | POA: Insufficient documentation

## 2012-09-26 DIAGNOSIS — R269 Unspecified abnormalities of gait and mobility: Secondary | ICD-10-CM | POA: Insufficient documentation

## 2012-09-26 NOTE — Progress Notes (Signed)
Occupational Therapy Treatment Patient Details  Name: Connor Small MRN: [id number] Date of Birth: August 27, 1960  Today's Date: 09/26/2012 Time: 6213-0865 OT Time Calculation (min): 49 min Manual therapy 10' Therapeutic activities 25' Therapeutic exercises 14' Visit#: 23 of 36  Re-eval: 10/10/12     Subjective:  S:  Im concerned about standing and pushing my toolbox when I go back to work.  I also have quite a bit of fine motor tasks that I will have to do.   Precautions/Restrictions     Exercise/Treatments Standing External Rotation: 15 reps;Theraband Theraband Level (Shoulder External Rotation): Level 3 (Green) Internal Rotation: 15 reps;Theraband Theraband Level (Shoulder Internal Rotation): Level 3 (Green) Extension: Theraband;15 reps Theraband Level (Shoulder Extension): Level 3 (Green) Row: Theraband;15 reps Theraband Level (Shoulder Row): Level 3 (Green) Retraction: Theraband;15 reps Theraband Level (Shoulder Retraction): Level 3 (Green) ROM / Strengthening / Isometric Strengthening UBE (Upper Arm Bike): 3' and 3' 2.0     Hand Exercises Theraputty: Roll;Grip;Locate Pegs Theraputty - Flatten: red - standing Theraputty - Roll: red Theraputty - Grip: red 12 beads Other Hand Exercises: threaded pony beads onto a piece of spaghetti with right hand.  broke pieces of spaghetting into 1 inch pieces.  Other Hand Exercises: placed clothespins and graduated strength clothespins onto clothesline.       Manual Therapy Manual Therapy: Myofascial release Myofascial Release: MFR and manual stretching to flexor and extensor forearm, wrist, and hand to decrease pain and fascial restrictions and increase pain free mobility  Occupational Therapy Assessment and Plan OT Assessment and Plan Clinical Impression Statement: A:  able to depress yellow, green, and blue graduated clothespins.   OT Plan: P:  Push sled around treatment gym.     Goals Short Term Goals Time to Complete Short  Term Goals: 6 weeks Short Term Goal 1: Patient will be educated on a HEP. Short Term Goal 2: Patient will weightbear on his RUE while completing ADLs with min pa. Short Term Goal 3: Patient will complete shower transfer with tub transfer bench with SBA. Short Term Goal 4: Patient will don and doff pants with reacher with SBA for safety. Short Term Goal 5: Patient will improve RUE and hand AROM by 25% for increased ability to use RUE as an active assist with daily activities.  Additional Short Term Goals?: Yes Short Term Goal 6: Patient will improve right grip strength by 10 pounds for increased ability to open containers.  Short Term Goal 7: Patient will complete pinch testing and coordination testing.  Long Term Goals Long Term Goal 1: Patient will complete all B/IADLs, work, and leisure activities at the highest level of indepdendence possible, using his right hand as dominant.  Long Term Goal 2: Patient will have WFL AROM in his right arm and hand for increased use with all functional activities.  Long Term Goal 3: Patient will have 25# or more grip strength and 10# or more pinch strength in his right hand for increased ability to complete woodworking and metal working [employer].  Long Term Goal 4: Patient will improve fine motor coordination to be able to manipulate his ostomy and ostomy supplies independently.  Long Term Goal 5: Patient will have WFL strength in his right arm for increased ability to complete household tasks.  Additional Long Term Goals?: Yes Long Term Goal 6: new goal established 08/17/12:  Patient will increase FMC needed to manipulate buttons, coins, etc by completing Nine Hole Peg Test in 40" or less.  Problem List Patient Active  Problem List   Diagnosis Date Noted  . 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  . Right hand weakness 08/10/2012  . Allergic rhinitis 06/28/2012  . Aspiration pneumonitis 04/26/2012  . Clostridium difficile colitis 04/26/2012  .  Intra-abdominal fluid collection/LUQ 04/26/2012  . CKD (chronic kidney disease) stage 2, GFR 60-89 ml/min 04/26/2012  . Acute renal failure 04/26/2012  . Hypokalemia 04/26/2012  . Protein-calorie malnutrition, severe 04/25/2012  . Spleen hematoma - subcapsular & without rupture  04/25/2012  . Ascites s/p US guide paracentesis (4/2) 04/25/2012  . Ileostomy in place 04/24/2012  . HAP (hospital-acquired pneumonia) 04/21/2012  . 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 Acute blood loss anemia 04/18/2012  . S/p total colectomy for toxic megacolon, C diff 03/23/2012  . Hypernatremia, improving 03/23/2012  . Severe muscle deconditioning 03/23/2012  . Shoulder subluxation, right 03/23/2012  . 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 Coagulopathy 03/23/2012  . Quadriparesis 03/17/2012  . Leukocytosis, unspecified 03/15/2012  . Hyperbilirubinemia, due to cholestasis 03/15/2012  . PAF (paroxysmal atrial fibrillation) 03/15/2012  . Anemia of chronic disease 03/15/2012  . Pleural effusion, L > R 03/04/2012  . Sinus tachycardia 02/28/2012  . Hematuria 02/28/2012  . history of UC (ulcerative colitis) 12/06/2010    End of Session Activity Tolerance: Patient tolerated treatment well General Behavior During Therapy: [facility] for tasks assessed/performed  GO    Shirlean Mylar, OTR/L  09/26/2012, 12:05 PM

## 2012-09-27 LAB — CBC
Hemoglobin: 11.4 g/dL — ABNORMAL LOW (ref 13.0–17.0)
MCH: 31.3 pg (ref 26.0–34.0)
MCHC: 33.2 g/dL (ref 30.0–36.0)
RDW: 15.9 % — ABNORMAL HIGH (ref 11.5–15.5)

## 2012-09-27 LAB — BASIC METABOLIC PANEL WITH GFR
BUN: 24 mg/dL — ABNORMAL HIGH (ref 6–23)
Calcium: 8.9 mg/dL (ref 8.4–10.5)
GFR, Est African American: 70 mL/min
Glucose, Bld: 87 mg/dL (ref 70–99)
Sodium: 140 mEq/L (ref 135–145)

## 2012-09-28 ENCOUNTER — Ambulatory Visit (HOSPITAL_COMMUNITY)
Admission: RE | Admit: 2012-09-28 | Discharge: 2012-09-28 | Disposition: A | Discharge status: Home / Self Care | Payer: BC Managed Care – PPO | Source: Ambulatory Visit | Attending: Family Medicine | Admitting: Family Medicine

## 2012-09-28 NOTE — Progress Notes (Signed)
Occupational Therapy Treatment Patient Details  Name: Connor Small MRN: [id number] Date of Birth: 09/04/1960  Today's Date: 09/28/2012 Time: 1015-1105 OT Time Calculation (min): 50 min Manual Therapy 1030-1050 20' Therapeutic exercises 1015-1030 15' Neuro reed 1050-1105 15' Visit#: 24 of 36  Re-eval: 10/10/12    Subjective  S:  We got my middle finger to bend quite a bit the other day, and then it just goes right back to the way it was.  Pain Assessment Currently in Pain?: No/denies  Precautions/Restrictions   progress as tolerated  Exercise/Treatments  Therapeutic exercises Standing External Rotation: 15 reps;Theraband Theraband Level (Shoulder External Rotation): Level 3 (Green) Internal Rotation: 15 reps;Theraband Theraband Level (Shoulder Internal Rotation): Level 3 (Green) Extension: Theraband;15 reps Theraband Level (Shoulder Extension): Level 3 (Green) Row: Theraband;15 reps Theraband Level (Shoulder Row): Level 3 (Green) Retraction: Theraband;15 reps Theraband Level (Shoulder Retraction): Level 3 (Green) ROM / Strengthening / Isometric Strengthening UBE (Upper Arm Bike): 3' and 3' 2.5 Other ROM/Strengthening Exercises: pushed sled around clinic X 2      Neuro reed - Hand Exercises MCPJ Flexion: PROM;10 reps MCPJ Extension: PROM;10 reps PIPJ Flexion: PROM;10 reps PIPJ Extension: PROM;10 reps DIPJ Flexion: PROM;10 reps DIPJ Extension: PROM;10 reps Joint Blocking Exercises: 5X PROM and 5X AROM Small Pegboard: grooved pegboard with moderate difficutly Other Hand Exercises: small pegboard design with pegs kept in container in order to build coordination skills needed to dig through pegs to retrieve the appropriate color.  minimal cuing at shoulder to maintain upright posture and avoid compensatory shoulder movements.      Manual Therapy Manual Therapy: Myofascial release Myofascial Release: MFR and manual stretching to flexor and extensor forearm, wrist, and  hand to decrease pain and fascial restrictions and increase pain free mobility  Occupational Therapy Assessment and Plan OT Assessment and Plan Clinical Impression Statement: A:  pushing sled fatiguing, grooved pegboard moderately difficult, with vg able to extend wrist when manipulating pegs.  Able to passively flex thumb to small finger.  OT Plan: P:  Increase to 4 trips around gym with sled.    Goals Short Term Goals Time to Complete Short Term Goals: 6 weeks Short Term Goal 1: Patient will be educated on a HEP. Short Term Goal 2: Patient will weightbear on his RUE while completing ADLs with min pa. Short Term Goal 3: Patient will complete shower transfer with tub transfer bench with SBA. Short Term Goal 4: Patient will don and doff pants with reacher with SBA for safety. Short Term Goal 5: Patient will improve RUE and hand AROM by 25% for increased ability to use RUE as an active assist with daily activities.  Additional Short Term Goals?: Yes Short Term Goal 6: Patient will improve right grip strength by 10 pounds for increased ability to open containers.  Short Term Goal 7: Patient will complete pinch testing and coordination testing.  Long Term Goals Long Term Goal 1: Patient will complete all B/IADLs, work, and leisure activities at the highest level of indepdendence possible, using his right hand as dominant.  Long Term Goal 2: Patient will have WFL AROM in his right arm and hand for increased use with all functional activities.  Long Term Goal 3: Patient will have 25# or more grip strength and 10# or more pinch strength in his right hand for increased ability to complete woodworking and metal working [employer].  Long Term Goal 4: Patient will improve fine motor coordination to be able to manipulate his ostomy and ostomy supplies  independently.  Long Term Goal 5: Patient will have WFL strength in his right arm for increased ability to complete household tasks.  Additional Long Term  Goals?: Yes Long Term Goal 6: new goal established 08/17/12:  Patient will increase FMC needed to manipulate buttons, coins, etc by completing Nine Hole Peg Test in 40" or less.  Problem List Patient Active Problem List   Diagnosis Date Noted  . 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  . Right hand weakness 08/10/2012  . Allergic rhinitis 06/28/2012  . Aspiration pneumonitis 04/26/2012  . Clostridium difficile colitis 04/26/2012  . Intra-abdominal fluid collection/LUQ 04/26/2012  . CKD (chronic kidney disease) stage 2, GFR 60-89 ml/min 04/26/2012  . Acute renal failure 04/26/2012  . Hypokalemia 04/26/2012  . Protein-calorie malnutrition, severe 04/25/2012  . Spleen hematoma - subcapsular & without rupture  04/25/2012  . Ascites s/p US guide paracentesis (4/2) 04/25/2012  . Ileostomy in place 04/24/2012  . HAP (hospital-acquired pneumonia) 04/21/2012  . 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 Acute blood loss anemia 04/18/2012  . S/p total colectomy for toxic megacolon, C diff 03/23/2012  . Hypernatremia, improving 03/23/2012  . Severe muscle deconditioning 03/23/2012  . Shoulder subluxation, right 03/23/2012  . 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 Coagulopathy 03/23/2012  . Quadriparesis 03/17/2012  . Leukocytosis, unspecified 03/15/2012  . Hyperbilirubinemia, due to cholestasis 03/15/2012  . PAF (paroxysmal atrial fibrillation) 03/15/2012  . Anemia of chronic disease 03/15/2012  . Pleural effusion, L > R 03/04/2012  . Sinus tachycardia 02/28/2012  . Hematuria 02/28/2012  . history of UC (ulcerative colitis) 12/06/2010    End of Session Activity Tolerance: Patient tolerated treatment well General Behavior During Therapy: [facility] for tasks assessed/performed  GO    Shirlean Mylar, OTR/L  09/28/2012, 11:00 AM

## 2012-10-01 ENCOUNTER — Ambulatory Visit (HOSPITAL_COMMUNITY)
Admission: RE | Admit: 2012-10-01 | Discharge: 2012-10-01 | Disposition: A | Discharge status: Home / Self Care | Payer: BC Managed Care – PPO | Source: Ambulatory Visit | Attending: Family Medicine | Admitting: Family Medicine

## 2012-10-01 NOTE — Progress Notes (Signed)
Occupational Therapy Treatment Patient Details  Name: Connor Small MRN: [id number] Date of Birth: [birth year]  Today's Date: 10/01/2012 Time: 1015-1100 OT Time Calculation (min): 45 min Parrafin 1015-1025 10' MFR 3329-5188 20' Therex 1045-1100 15'  Visit#: 25 of 36  Re-eval: 10/10/12    Authorization: n/a  Authorization Time Period:    Authorization Visit#:   of    Subjective Symptoms/Limitations Symptoms: S: I see Dr. Riley Kill on the 26th or something like that and we'll talk about surgery on my right hand.  Pain Assessment Currently in Pain?: No/denies  Precautions/Restrictions  Precautions Precautions: None  Exercise/Treatments ROM / Strengthening / Isometric Strengthening Other ROM/Strengthening Exercises: pushed sled around clinic X 4 with 2 short rest breaks    Hand Exercises MCPJ Flexion: PROM;10 reps MCPJ Extension: PROM;10 reps PIPJ Flexion: PROM;10 reps PIPJ Extension: PROM;10 reps DIPJ Flexion: PROM;10 reps DIPJ Extension: PROM;10 reps Joint Blocking Exercises: 5X PROM and 5X AROM     Manual Therapy Myofascial Release: MFR and manual stretching to flexor and extensor forearm, wrist, and hand to decrease pain and fascial restrictions and increase pain free mobility Other Manual Therapy: Paraffin Wax to Right hand to increase joint mobility and ROM. Manual stretching performed immediately afterwards  Occupational Therapy Assessment and Plan OT Assessment and Plan Clinical Impression Statement: A: Pt able to complete sled pushing four times around gym this date with rest breaks.  OT Plan: P: perform ES to RUE to increase functional grasp of hand.    Goals Short Term Goals Time to Complete Short Term Goals: 6 weeks Short Term Goal 1: Patient will be educated on a HEP. Short Term Goal 2: Patient will weightbear on his RUE while completing ADLs with min pa. Short Term Goal 3: Patient will complete shower transfer with tub transfer bench with SBA. Short Term  Goal 4: Patient will don and doff pants with reacher with SBA for safety. Short Term Goal 5: Patient will improve RUE and hand AROM by 25% for increased ability to use RUE as an active assist with daily activities.  Additional Short Term Goals?: Yes Short Term Goal 6: Patient will improve right grip strength by 10 pounds for increased ability to open containers.  Short Term Goal 7: Patient will complete pinch testing and coordination testing.  Long Term Goals Long Term Goal 1: Patient will complete all B/IADLs, work, and leisure activities at the highest level of indepdendence possible, using his right hand as dominant.  Long Term Goal 1 Progress: Progressing toward goal Long Term Goal 2: Patient will have WFL AROM in his right arm and hand for increased use with all functional activities.  Long Term Goal 2 Progress: Progressing toward goal Long Term Goal 3: Patient will have 25# or more grip strength and 10# or more pinch strength in his right hand for increased ability to complete woodworking and metal working [employer].  Long Term Goal 3 Progress: Progressing toward goal Long Term Goal 4: Patient will improve fine motor coordination to be able to manipulate his ostomy and ostomy supplies independently.  Long Term Goal 5: Patient will have WFL strength in his right arm for increased ability to complete household tasks.  Long Term Goal 5 Progress: Progressing toward goal Additional Long Term Goals?: Yes Long Term Goal 6: new goal established 08/17/12:  Patient will increase FMC needed to manipulate buttons, coins, etc by completing Nine Hole Peg Test in 40" or less. Long Term Goal 6 Progress: Progressing toward goal  Problem List Patient  Active Problem List   Diagnosis Date Noted  . 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  . Right hand weakness 08/10/2012  . Allergic rhinitis 06/28/2012  . Aspiration pneumonitis 04/26/2012  . Clostridium difficile colitis 04/26/2012  .  Intra-abdominal fluid collection/LUQ 04/26/2012  . CKD (chronic kidney disease) stage 2, GFR 60-89 ml/min 04/26/2012  . Acute renal failure 04/26/2012  . Hypokalemia 04/26/2012  . Protein-calorie malnutrition, severe 04/25/2012  . Spleen hematoma - subcapsular & without rupture  04/25/2012  . Ascites s/p US guide paracentesis (4/2) 04/25/2012  . Ileostomy in place 04/24/2012  . HAP (hospital-acquired pneumonia) 04/21/2012  . 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 Acute blood loss anemia 04/18/2012  . S/p total colectomy for toxic megacolon, C diff 03/23/2012  . Hypernatremia, improving 03/23/2012  . Severe muscle deconditioning 03/23/2012  . Shoulder subluxation, right 03/23/2012  . 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 Coagulopathy 03/23/2012  . Quadriparesis 03/17/2012  . Leukocytosis, unspecified 03/15/2012  . Hyperbilirubinemia, due to cholestasis 03/15/2012  . PAF (paroxysmal atrial fibrillation) 03/15/2012  . Anemia of chronic disease 03/15/2012  . Pleural effusion, L > R 03/04/2012  . Sinus tachycardia 02/28/2012  . Hematuria 02/28/2012  . history of UC (ulcerative colitis) 12/06/2010    End of Session Activity Tolerance: Patient tolerated treatment well General Behavior During Therapy: [facility] for tasks assessed/performed   Limmie Patricia, OTR/L,CBIS   10/01/2012, 1:10 PM

## 2012-10-03 ENCOUNTER — Ambulatory Visit (HOSPITAL_COMMUNITY)
Admission: RE | Admit: 2012-10-03 | Discharge: 2012-10-03 | Disposition: A | Discharge status: Home / Self Care | Payer: BC Managed Care – PPO | Source: Ambulatory Visit | Attending: Family Medicine | Admitting: Family Medicine

## 2012-10-03 NOTE — Progress Notes (Signed)
Occupational Therapy Treatment Patient Details  Name: Connor Small MRN: [id number] Date of Birth: [birth year]  Today's Date: 10/03/2012 Time: 0865-7846 OT Time Calculation (min): 53 min Theract 1015-1040 25' ES 1030-1108 28'  Visit#: 26 of 36  Re-eval: 10/10/12    Authorization: n/a  Authorization Time Period:    Authorization Visit#:   of    Subjective Symptoms/Limitations Symptoms: S: I put my  in between my knees and use that to help bend them.   Pain Assessment Currently in Pain?: No/denies  Precautions/Restrictions  Precautions Precautions: None  Exercise/Treatments Hand Exercises  10/03/12 1100  Hand Exercises  Theraputty Flatten;Locate Pegs  Theraputty - Flatten yellow-standing  Theraputty - Locate Pegs 10- yellow     Occupational Therapy Assessment and Plan OT Assessment and Plan Clinical Impression Statement: A: Patient tolerated ES to RUE flexor and extensors. Increased AROM of Right hand pointer finger noted.  OT Plan: P: Cont to work on functional grasp and hold activities used during daily activities.    Goals Short Term Goals Time to Complete Short Term Goals: 6 weeks Short Term Goal 1: Patient will be educated on a HEP. Short Term Goal 2: Patient will weightbear on his RUE while completing ADLs with min pa. Short Term Goal 3: Patient will complete shower transfer with tub transfer bench with SBA. Short Term Goal 4: Patient will don and doff pants with reacher with SBA for safety. Short Term Goal 5: Patient will improve RUE and hand AROM by 25% for increased ability to use RUE as an active assist with daily activities.  Additional Short Term Goals?: Yes Short Term Goal 6: Patient will improve right grip strength by 10 pounds for increased ability to open containers.  Short Term Goal 7: Patient will complete pinch testing and coordination testing.  Long Term Goals Long Term Goal 1: Patient will complete all B/IADLs, work, and leisure activities at  the highest level of indepdendence possible, using his right hand as dominant.  Long Term Goal 2: Patient will have WFL AROM in his right arm and hand for increased use with all functional activities.  Long Term Goal 3: Patient will have 25# or more grip strength and 10# or more pinch strength in his right hand for increased ability to complete woodworking and metal working [employer].  Long Term Goal 4: Patient will improve fine motor coordination to be able to manipulate his ostomy and ostomy supplies independently.  Long Term Goal 5: Patient will have WFL strength in his right arm for increased ability to complete household tasks.  Additional Long Term Goals?: Yes Long Term Goal 6: new goal established 08/17/12:  Patient will increase FMC needed to manipulate buttons, coins, etc by completing Nine Hole Peg Test in 40" or less.  Problem List Patient Active Problem List   Diagnosis Date Noted  . 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  . Right hand weakness 08/10/2012  . Allergic rhinitis 06/28/2012  . Aspiration pneumonitis 04/26/2012  . Clostridium difficile colitis 04/26/2012  . Intra-abdominal fluid collection/LUQ 04/26/2012  . CKD (chronic kidney disease) stage 2, GFR 60-89 ml/min 04/26/2012  . Acute renal failure 04/26/2012  . Hypokalemia 04/26/2012  . Protein-calorie malnutrition, severe 04/25/2012  . Spleen hematoma - subcapsular & without rupture  04/25/2012  . Ascites s/p US guide paracentesis (4/2) 04/25/2012  . Ileostomy in place 04/24/2012  . HAP (hospital-acquired pneumonia) 04/21/2012  . 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 Acute blood loss anemia 04/18/2012  . S/p  total colectomy for toxic megacolon, C diff 03/23/2012  . Hypernatremia, improving 03/23/2012  . Severe muscle deconditioning 03/23/2012  . Shoulder subluxation, right 03/23/2012  . 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 Coagulopathy 03/23/2012  .  Quadriparesis 03/17/2012  . Leukocytosis, unspecified 03/15/2012  . Hyperbilirubinemia, due to cholestasis 03/15/2012  . PAF (paroxysmal atrial fibrillation) 03/15/2012  . Anemia of chronic disease 03/15/2012  . Pleural effusion, L > R 03/04/2012  . Sinus tachycardia 02/28/2012  . Hematuria 02/28/2012  . history of UC (ulcerative colitis) 12/06/2010    End of Session Activity Tolerance: Patient tolerated treatment well General Behavior During Therapy: [facility] for tasks assessed/performed   Limmie Patricia, OTR/L,CBIS   10/03/2012, 4:26 PM

## 2012-10-05 ENCOUNTER — Ambulatory Visit (HOSPITAL_COMMUNITY)
Admission: RE | Admit: 2012-10-05 | Discharge: 2012-10-05 | Disposition: A | Discharge status: Home / Self Care | Payer: BC Managed Care – PPO | Source: Ambulatory Visit | Attending: Family Medicine | Admitting: Family Medicine

## 2012-10-05 NOTE — Progress Notes (Signed)
Occupational Therapy Treatment Patient Details  Name: Connor Small MRN: [id number] Date of Birth: [birth year]  Today's Date: 10/05/2012 Time: 1017-1100 OT Time Calculation (min): 43 min ES 1027-2536 20' Therex 1037-1100 23'  Visit#: 27 of 36  Re-eval: 10/10/12    Authorization: n/a  Authorization Time Period:    Authorization Visit#:   of    Subjective Symptoms/Limitations Symptoms: S: My wrist is moving more then when I first started.  Pain Assessment Currently in Pain?: No/denies  Precautions/Restrictions   None  Exercise/Treatments Hand Exercises Theraputty: Flatten;Roll;Grip (pt squeezed putty during ES.) Theraputty - Flatten: green - standing Theraputty - Roll: green Theraputty - Grip: green     Museum/gallery exhibitions officer Stimulation Location: 1) Wrist and digit flexors 2) wrist and digit extensors Electrical Stimulation Action: Doctor, hospital Parameters: Flexors and Extensors: 29 mA CC, 10 minutes each, Cycle: 10/20 Electrical Stimulation Goals: Neuromuscular facilitation  Occupational Therapy Assessment and Plan OT Assessment and Plan Clinical Impression Statement: A: Patient completed ES to wrist flexors and extensors although patient experienced wrist and finger flexion when electrodes were placed on extensors.  OT Plan: P: Cont to work on functional grasp and hold activities used during daily activities.    Goals Short Term Goals Time to Complete Short Term Goals: 6 weeks Short Term Goal 1: Patient will be educated on a HEP. Short Term Goal 2: Patient will weightbear on his RUE while completing ADLs with min pa. Short Term Goal 3: Patient will complete shower transfer with tub transfer bench with SBA. Short Term Goal 4: Patient will don and doff pants with reacher with SBA for safety. Short Term Goal 5: Patient will improve RUE and hand AROM by 25% for increased ability to use RUE as an active assist with daily activities.   Additional Short Term Goals?: Yes Short Term Goal 6: Patient will improve right grip strength by 10 pounds for increased ability to open containers.  Short Term Goal 7: Patient will complete pinch testing and coordination testing.  Long Term Goals Long Term Goal 1: Patient will complete all B/IADLs, work, and leisure activities at the highest level of indepdendence possible, using his right hand as dominant.  Long Term Goal 2: Patient will have WFL AROM in his right arm and hand for increased use with all functional activities.  Long Term Goal 3: Patient will have 25# or more grip strength and 10# or more pinch strength in his right hand for increased ability to complete woodworking and metal working [employer].  Long Term Goal 4: Patient will improve fine motor coordination to be able to manipulate his ostomy and ostomy supplies independently.  Long Term Goal 5: Patient will have WFL strength in his right arm for increased ability to complete household tasks.  Additional Long Term Goals?: Yes Long Term Goal 6: new goal established 08/17/12:  Patient will increase FMC needed to manipulate buttons, coins, etc by completing Nine Hole Peg Test in 40" or less.  Problem List Patient Active Problem List   Diagnosis Date Noted  . 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  . Right hand weakness 08/10/2012  . Allergic rhinitis 06/28/2012  . Aspiration pneumonitis 04/26/2012  . Clostridium difficile colitis 04/26/2012  . Intra-abdominal fluid collection/LUQ 04/26/2012  . CKD (chronic kidney disease) stage 2, GFR 60-89 ml/min 04/26/2012  . Acute renal failure 04/26/2012  . Hypokalemia 04/26/2012  . Protein-calorie malnutrition, severe 04/25/2012  . Spleen hematoma - subcapsular & without rupture  04/25/2012  . Ascites  s/p US guide paracentesis (4/2) 04/25/2012  . Ileostomy in place 04/24/2012  . HAP (hospital-acquired pneumonia) 04/21/2012  . Critical illness myopathy and neuropathy  04/18/2012  . Acute blood loss anemia 04/18/2012  . S/p total colectomy for toxic megacolon, C diff 03/23/2012  . Hypernatremia, improving 03/23/2012  . Severe muscle deconditioning 03/23/2012  . Shoulder subluxation, right 03/23/2012  . 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 Coagulopathy 03/23/2012  . Quadriparesis 03/17/2012  . Leukocytosis, unspecified 03/15/2012  . Hyperbilirubinemia, due to cholestasis 03/15/2012  . PAF (paroxysmal atrial fibrillation) 03/15/2012  . Anemia of chronic disease 03/15/2012  . Pleural effusion, L > R 03/04/2012  . Sinus tachycardia 02/28/2012  . Hematuria 02/28/2012  . history of UC (ulcerative colitis) 12/06/2010    End of Session Activity Tolerance: Patient tolerated treatment well General Behavior During Therapy: [facility] for tasks assessed/performed  Limmie Patricia, OTR/L,CBIS   10/05/2012, 1:20 PM

## 2012-10-08 ENCOUNTER — Ambulatory Visit (HOSPITAL_COMMUNITY)
Admission: RE | Admit: 2012-10-08 | Discharge: 2012-10-08 | Disposition: A | Discharge status: Home / Self Care | Payer: BC Managed Care – PPO | Source: Ambulatory Visit | Attending: Family Medicine | Admitting: Family Medicine

## 2012-10-08 NOTE — Progress Notes (Signed)
Occupational Therapy Treatment Patient Details  Name: Connor Small MRN: [id number] Date of Birth: 03-Aug-1960  Today's Date: 10/08/2012 Time: 1020-1105 OT Time Calculation (min): 45 min Manual Therapy 1040-1055 15' Therapeutic activities 1020-1040 20' Neuro re-education [phone] 10' Visit#: 28 of 36  Re-eval: 10/10/12     Subjective S:  Im not sure why my long finger is swollen.  Pain Assessment Currently in Pain?: No/denies Pain Score: 0-No pain  Precautions/Restrictions   n/a  Exercise/Treatments Standing Horizontal ABduction: Theraband;15 reps Theraband Level (Shoulder Horizontal ABduction): Level 3 (Green) External Rotation: 15 reps;Theraband Theraband Level (Shoulder External Rotation): Level 3 (Green) Internal Rotation: 15 reps;Theraband Theraband Level (Shoulder Internal Rotation): Level 3 (Green) Extension: Theraband;15 reps Theraband Level (Shoulder Extension): Level 3 (Green) Row: Theraband;15 reps Theraband Level (Shoulder Row): Level 3 (Green) Retraction: Theraband;15 reps Theraband Level (Shoulder Retraction): Level 3 (Green) ROM / Strengthening / Isometric Strengthening UBE (Upper Arm Bike): 3' and 3' 2.5 Other ROM/Strengthening Exercises: pushed sled around clinic X 4 with one rest break    Wrist Exercises Other wrist exercises: velcro board wrist flex/extension 10 reps with min vg for technique Other wrist exercises: lateral pinch  velcro board for supination/pronation and lateral pinch          Manual Therapy Manual Therapy: Myofascial release Joint Mobilization: Joint mobilizaton to RUE hand and wrist to increase joint mobility during daily tasks Myofascial Release: MFR and manual stretching to flexor and extensor forearm, wrist, and hand to decrease pain and fascial restrictions and increase pain free mobility  Occupational Therapy Assessment and Plan OT Assessment and Plan Clinical Impression Statement: A:  Able to complete 4 rounds of sled  push with only one rest break.  Appears to have increased PROM in each digit this date.  OT Plan: P:  Reassess, complete sled push without rest breaks   Goals Short Term Goals Time to Complete Short Term Goals: 6 weeks Short Term Goal 1: Patient will be educated on a HEP. Short Term Goal 2: Patient will weightbear on his RUE while completing ADLs with min pa. Short Term Goal 3: Patient will complete shower transfer with tub transfer bench with SBA. Short Term Goal 4: Patient will don and doff pants with reacher with SBA for safety. Short Term Goal 5: Patient will improve RUE and hand AROM by 25% for increased ability to use RUE as an active assist with daily activities.  Additional Short Term Goals?: Yes Short Term Goal 6: Patient will improve right grip strength by 10 pounds for increased ability to open containers.  Short Term Goal 7: Patient will complete pinch testing and coordination testing.  Long Term Goals Long Term Goal 1: Patient will complete all B/IADLs, work, and leisure activities at the highest level of indepdendence possible, using his right hand as dominant.  Long Term Goal 2: Patient will have WFL AROM in his right arm and hand for increased use with all functional activities.  Long Term Goal 3: Patient will have 25# or more grip strength and 10# or more pinch strength in his right hand for increased ability to complete woodworking and metal working [employer].  Long Term Goal 4: Patient will improve fine motor coordination to be able to manipulate his ostomy and ostomy supplies independently.  Long Term Goal 5: Patient will have WFL strength in his right arm for increased ability to complete household tasks.  Additional Long Term Goals?: Yes Long Term Goal 6: new goal established 08/17/12:  Patient will increase [facility] needed  to manipulate buttons, coins, etc by completing Nine Hole Peg Test in 40" or less.  Problem List Patient Active Problem List   Diagnosis Date Noted  .  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  . Right hand weakness 08/10/2012  . Allergic rhinitis 06/28/2012  . Aspiration pneumonitis 04/26/2012  . Clostridium difficile colitis 04/26/2012  . Intra-abdominal fluid collection/LUQ 04/26/2012  . CKD (chronic kidney disease) stage 2, GFR 60-89 ml/min 04/26/2012  . Acute renal failure 04/26/2012  . Hypokalemia 04/26/2012  . Protein-calorie malnutrition, severe 04/25/2012  . Spleen hematoma - subcapsular & without rupture  04/25/2012  . Ascites s/p US guide paracentesis (4/2) 04/25/2012  . Ileostomy in place 04/24/2012  . HAP (hospital-acquired pneumonia) 04/21/2012  . 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 Acute blood loss anemia 04/18/2012  . S/p total colectomy for toxic megacolon, C diff 03/23/2012  . Hypernatremia, improving 03/23/2012  . Severe muscle deconditioning 03/23/2012  . Shoulder subluxation, right 03/23/2012  . 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 Coagulopathy 03/23/2012  . Quadriparesis 03/17/2012  . Leukocytosis, unspecified 03/15/2012  . Hyperbilirubinemia, due to cholestasis 03/15/2012  . PAF (paroxysmal atrial fibrillation) 03/15/2012  . Anemia of chronic disease 03/15/2012  . Pleural effusion, L > R 03/04/2012  . Sinus tachycardia 02/28/2012  . Hematuria 02/28/2012  . history of UC (ulcerative colitis) 12/06/2010    End of Session Activity Tolerance: Patient tolerated treatment well General Behavior During Therapy: [facility] for tasks assessed/performed  GO    Shirlean Mylar, OTR/L  10/08/2012, 11:08 AM

## 2012-10-10 ENCOUNTER — Ambulatory Visit (HOSPITAL_COMMUNITY)
Admission: RE | Admit: 2012-10-10 | Discharge: 2012-10-10 | Disposition: A | Discharge status: Home / Self Care | Payer: BC Managed Care – PPO | Source: Ambulatory Visit | Attending: Family Medicine | Admitting: Family Medicine

## 2012-10-10 NOTE — Evaluation (Addendum)
Occupational Therapy Reassessment  Patient Details  Name: Connor Small MRN: [id number] Date of Birth: 19-Oct-1960  Today's Date: 10/10/2012 Time: 1020-1100 OT Time Calculation (min): 40 min Reassess 1020-1100 40'  Visit#: 29 of 48  Re-eval: 11/07/12  Assessment Diagnosis: CVA and critical illness myelopathy  Authorization: n/a  Authorization Time Period:    Authorization Visit#:   of     Past Medical History:  Past Medical History  Diagnosis Date  . Chronic diarrhea   . Rectal bleed   . Hemorrhoids   . Ulcerative colitis     Distal UC over 8 yrs ago diagnosed  . Diverticulitis of large intestine with perforation 10/2011    done at [place]  . 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 history of UC (ulcerative colitis) 12/06/2010   Past Surgical History:  Past Surgical History  Procedure Laterality Date  . Colonoscopy  9/03  . Sigmoidoscopy       06/13/2002  . Temporary ostomy November 06, 2010      for a colon perforation that was done in [facility] (Dr Ruben Im).    . Colonoscopy  02/16/2011    Procedure: COLONOSCOPY;  Surgeon: Malissa Hippo, MD;  Location: [facility];  Service: Endoscopy;  Laterality: N/A;  100  . Flexible sigmoidoscopy  02/27/2012    Procedure: FLEXIBLE SIGMOIDOSCOPY;  Surgeon: Malissa Hippo, MD;  Location: [facility];  Service: Endoscopy;  Laterality: N/A;  with colonic decompression  . Laparotomy  02/27/2012    Procedure: EXPLORATORY LAPAROTOMY;  Surgeon: Fabio Bering, MD;  Location: [facility];  Service: General;  Laterality: N/A;  . Cecostomy  02/27/2012    Procedure: CECOSTOMY;  Surgeon: Fabio Bering, MD;  Location: [facility];  Service: General;  Laterality: N/A;  Cecostomy Tube Placement  . Colectomy  02/28/2012    Procedure: TOTAL COLECTOMY;  Surgeon: Fabio Bering, MD;  Location: [facility];  Service: General;  Laterality: N/A;  . Ileostomy  02/28/2012    Procedure: ILEOSTOMY;  Surgeon: Fabio Bering, MD;  Location: [facility];  Service: General;  Laterality:  N/A;  . Cystoscopy w/ ureteral stent placement Bilateral 03/20/2012    Procedure: CYSTOSCOPY WITH RETROGRADE PYELOGRAM/URETERAL STENT PLACEMENT;  Surgeon: Sebastian Ache, MD;  [facility] OR;  Service: Urology;  Laterality: Bilateral;  . Laparotomy N/A 03/26/2012    Procedure: EXPLORATORY LAPAROTOMY;  Surgeon: Mariella Saa, MD;  Location: [facility] OR;  Service: General;  Laterality: N/A;  . Application of wound vac N/A 03/26/2012    Procedure: APPLICATION OF WOUND VAC;  Surgeon: Mariella Saa, MD;  Location: [facility] OR;  Service: General;  Laterality: N/A;  . Liver biopsy N/A 03/26/2012    Procedure: LIVER BIOPSY;  Surgeon: Mariella Saa, MD;  Location: [facility] OR;  Service: General;  Laterality: N/A;  . Laparotomy N/A 03/29/2012    Procedure: EXPLORATORY LAPAROTOMY, PARTIAL WOUND CLOSURE;  Surgeon: Mariella Saa, MD;  Location: [facility] OR;  Service: General;  Laterality: N/A;  . Vacuum assisted closure change N/A 03/29/2012    Procedure: Open ABDOMINAL VACUUM  CHANGE;  Surgeon: Mariella Saa, MD;  Location: [facility] OR;  Service: General;  Laterality: N/A;  . Vacuum assisted closure change N/A 04/01/2012    Procedure: removal of abdominal vac dressing and abdominal closure;  Surgeon: Mariella Saa, MD;  Location: [facility] OR;  Service: General;  Laterality: N/A;    Subjective Symptoms/Limitations Symptoms: S: I was using a screwdriver the other day and it was  hard to turn it. Pain Assessment Currently in Pain?: No/denies  Precautions/Restrictions  Precautions Precautions: None   Assessment  Sensation/Coordination/Edema Coordination 9 Hole Peg Test: 33.0" (last progress note: 43.6")  Additional Assessments RUE AROM (degrees) RUE Overall AROM Comments: assessed in seated Right Shoulder Flexion: 121 Degrees (last progress note: 115) Right Shoulder ABduction: 121 Degrees (last progress note: 97) Right Shoulder Internal Rotation: 90 Degrees (same) Right Shoulder External Rotation: 90  Degrees (same) Right Elbow Extension:  (lacking 10 degrees (last progress note: lacking 15 degrees)) Right Forearm Supination: 90 Degrees (last progress note: 80) Right Wrist Extension: 26 Degrees (last progress note: 15) Right Wrist Flexion: 66 Degrees (last progress note: 60) RUE Strength Grip (lbs): 27 (last progress note; 24) Lateral Pinch: 11 lbs (last progress note; 8) 3 Point Pinch: 5 lbs (same)                                                           Right Shoulder Flexion 4/5  Right Shoulder ABduction 4/5  Right Shoulder Internal Rotation 3+/5  Right Shoulder External Rotation 3+/5  Right Elbow Flexion 3+/5  Right Elbow Extension 3+/5  Right AROM Digit Flexion T 30/36 I 54/38/10 L 78/0/6 R 60/30/0 S 62/74/0 Right AROM Digit Extension T 0/0 I 30/8/0 L 52/0/0 R 30/8/0 S 24/40/0 (Last progress note: Digit Flexion: T 22/36, I 68/36/0 L 76/0/0 R 70/32/0 S 62/60/0  Digit Extension: T 0/0 I 52/12/0 L 56/0/0 R 44/2/0 S 44/32/0)      Occupational Therapy Assessment and Plan OT Assessment and Plan Clinical Impression Statement: A: See MD note for progress. patient continues to show improvement with therapy. Patient has met 1/6 long term goals and has partly met another long term goal.  OT Plan: P:  Complete sled push without rest breaks. Work on release of objects when held in hand (ie. throwing items).   Goals Short Term Goals Time to Complete Short Term Goals: 6 weeks Short Term Goal 1: Patient will be educated on a HEP. Short Term Goal 2: Patient will weightbear on his RUE while completing ADLs with min pa. Short Term Goal 3: Patient will complete shower transfer with tub transfer bench with SBA. Short Term Goal 4: Patient will don and doff pants with reacher with SBA for safety. Short Term Goal 5: Patient will improve RUE and hand AROM by 25% for increased ability to use RUE as an active assist with daily activities.  Additional Short Term Goals?:  Yes Short Term Goal 6: Patient will improve right grip strength by 10 pounds for increased ability to open containers.  Short Term Goal 7: Patient will complete pinch testing and coordination testing.  Long Term Goals Long Term Goal 1: Patient will complete all B/IADLs, work, and leisure activities at the highest level of indepdendence possible, using his right hand as dominant.  Long Term Goal 1 Progress: Progressing toward goal Long Term Goal 2: Patient will have WFL AROM in his right arm and hand for increased use with all functional activities.  Long Term Goal 2 Progress: Progressing toward goal Long Term Goal 3: Patient will have 25# or more grip strength and 10# or more pinch strength in his right hand for increased ability to complete woodworking and metal working [employer].  Long Term Goal 3 Progress: Partly  met Long Term Goal 4: Patient will improve fine motor coordination to be able to manipulate his ostomy and ostomy supplies independently.  Long Term Goal 5: Patient will have WFL strength in his right arm for increased ability to complete household tasks.  Long Term Goal 5 Progress: Progressing toward goal Additional Long Term Goals?: Yes Long Term Goal 6: new goal established 08/17/12:  Patient will increase FMC needed to manipulate buttons, coins, etc by completing Nine Hole Peg Test in 40" or less. Long Term Goal 6 Progress: Met Long Term Goal 7: New goal established 10/10/12: Patient will increase pinch strength to 10# in increase ability to complete woodworking and metal working activities.  Long Term Goal 7 Progress: Progressing toward goal  Problem List Patient Active Problem List   Diagnosis Date Noted  . 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  . Right hand weakness 08/10/2012  . Allergic rhinitis 06/28/2012  . Aspiration pneumonitis 04/26/2012  . Clostridium difficile colitis 04/26/2012  . Intra-abdominal fluid collection/LUQ 04/26/2012  . CKD (chronic kidney  disease) stage 2, GFR 60-89 ml/min 04/26/2012  . Acute renal failure 04/26/2012  . Hypokalemia 04/26/2012  . Protein-calorie malnutrition, severe 04/25/2012  . Spleen hematoma - subcapsular & without rupture  04/25/2012  . Ascites s/p US guide paracentesis (4/2) 04/25/2012  . Ileostomy in place 04/24/2012  . HAP (hospital-acquired pneumonia) 04/21/2012  . 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 Acute blood loss anemia 04/18/2012  . S/p total colectomy for toxic megacolon, C diff 03/23/2012  . Hypernatremia, improving 03/23/2012  . Severe muscle deconditioning 03/23/2012  . Shoulder subluxation, right 03/23/2012  . 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 Coagulopathy 03/23/2012  . Quadriparesis 03/17/2012  . Leukocytosis, unspecified 03/15/2012  . Hyperbilirubinemia, due to cholestasis 03/15/2012  . PAF (paroxysmal atrial fibrillation) 03/15/2012  . Anemia of chronic disease 03/15/2012  . Pleural effusion, L > R 03/04/2012  . Sinus tachycardia 02/28/2012  . Hematuria 02/28/2012  . history of UC (ulcerative colitis) 12/06/2010    End of Session Activity Tolerance: Patient tolerated treatment well General Behavior During Therapy: [facility] for tasks assessed/performed   Limmie Patricia, OTR/L,CBIS   10/10/2012, 12:24 PM  Physician Documentation Your signature is required to indicate approval of the treatment plan as stated above.  Please sign and either send electronically or make a copy of this report for your files and return this physician signed original.  Please mark one 1.__approve of plan  2. ___approve of plan with the following conditions.   ______________________________                                                          _____________________ Physician Signature  Date

## 2012-10-12 ENCOUNTER — Ambulatory Visit (HOSPITAL_COMMUNITY)
Admission: RE | Admit: 2012-10-12 | Discharge: 2012-10-12 | Disposition: A | Discharge status: Home / Self Care | Payer: BC Managed Care – PPO | Source: Ambulatory Visit | Attending: Family Medicine | Admitting: Family Medicine

## 2012-10-12 NOTE — Progress Notes (Signed)
Occupational Therapy Treatment Patient Details  Name: Connor Small MRN: [id number] Date of Birth: [birth year]  Today's Date: 10/12/2012 Time: 1020-1100 OT Time Calculation (min): 40 min ES 1020-1045' 25' Therex 1045-1100 15'  Visit#: 30 of 48  Re-eval: 11/07/12    Authorization: n/a  Authorization Time Period:    Authorization Visit#:   of    Subjective Symptoms/Limitations Symptoms: S: It's easier to throw something when it's weighted. Pain Assessment Currently in Pain?: No/denies  Precautions/Restrictions  Precautions Precautions: None  Exercise/Treatments ROM / Strengthening / Isometric Strengthening Rebounder: green ball; underhand and external to internal rotation throw; 10'        Modalities Modalities: Archivist Stimulation Location: 1) Wrist and digit flexors 2) wrist and digit extensors Electrical Stimulation Action: Doctor, hospital Parameters: 1) wrist flexors: 41 mA CC, 10' Cycle: 10/20 2) Wrist extensors; 35 mA CC, Cycle: 10/20  Electrical Stimulation Goals: Neuromuscular facilitation  Occupational Therapy Assessment and Plan OT Assessment and Plan Clinical Impression Statement: A: Patient completed ball throwing activity to work on finger extension and flexion. Minimal difficulty.  OT Plan: P:  Complete sled push without rest breaks. Use lighter ball for throwing activities.    Goals Short Term Goals Time to Complete Short Term Goals: 6 weeks Short Term Goal 1: Patient will be educated on a HEP. Short Term Goal 2: Patient will weightbear on his RUE while completing ADLs with min pa. Short Term Goal 3: Patient will complete shower transfer with tub transfer bench with SBA. Short Term Goal 4: Patient will don and doff pants with reacher with SBA for safety. Short Term Goal 5: Patient will improve RUE and hand AROM by 25% for increased ability to use RUE as an active assist with daily  activities.  Additional Short Term Goals?: Yes Short Term Goal 6: Patient will improve right grip strength by 10 pounds for increased ability to open containers.  Short Term Goal 7: Patient will complete pinch testing and coordination testing.  Long Term Goals Long Term Goal 1: Patient will complete all B/IADLs, work, and leisure activities at the highest level of indepdendence possible, using his right hand as dominant.  Long Term Goal 2: Patient will have WFL AROM in his right arm and hand for increased use with all functional activities.  Long Term Goal 3: Patient will have 25# or more grip strength and 10# or more pinch strength in his right hand for increased ability to complete woodworking and metal working [employer].  Long Term Goal 4: Patient will improve fine motor coordination to be able to manipulate his ostomy and ostomy supplies independently.  Long Term Goal 5: Patient will have WFL strength in his right arm for increased ability to complete household tasks.  Additional Long Term Goals?: Yes Long Term Goal 6: new goal established 08/17/12:  Patient will increase FMC needed to manipulate buttons, coins, etc by completing Nine Hole Peg Test in 40" or less. Long Term Goal 7: New goal established 10/10/12: Patient will increase pinch strength to 10# in increase ability to complete woodworking and metal working activities.   Problem List Patient Active Problem List   Diagnosis Date Noted  . 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  . Right hand weakness 08/10/2012  . Allergic rhinitis 06/28/2012  . Aspiration pneumonitis 04/26/2012  . Clostridium difficile colitis 04/26/2012  . Intra-abdominal fluid collection/LUQ 04/26/2012  . CKD (chronic kidney disease) stage 2, GFR 60-89 ml/min 04/26/2012  . Acute renal failure  04/26/2012  . Hypokalemia 04/26/2012  . Protein-calorie malnutrition, severe 04/25/2012  . Spleen hematoma - subcapsular & without rupture  04/25/2012  . Ascites  s/p US guide paracentesis (4/2) 04/25/2012  . Ileostomy in place 04/24/2012  . HAP (hospital-acquired pneumonia) 04/21/2012  . 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 Acute blood loss anemia 04/18/2012  . S/p total colectomy for toxic megacolon, C diff 03/23/2012  . Hypernatremia, improving 03/23/2012  . Severe muscle deconditioning 03/23/2012  . Shoulder subluxation, right 03/23/2012  . 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 Coagulopathy 03/23/2012  . Quadriparesis 03/17/2012  . Leukocytosis, unspecified 03/15/2012  . Hyperbilirubinemia, due to cholestasis 03/15/2012  . PAF (paroxysmal atrial fibrillation) 03/15/2012  . Anemia of chronic disease 03/15/2012  . Pleural effusion, L > R 03/04/2012  . Sinus tachycardia 02/28/2012  . Hematuria 02/28/2012  . history of UC (ulcerative colitis) 12/06/2010    End of Session Activity Tolerance: Patient tolerated treatment well General Behavior During Therapy: [facility] for tasks assessed/performed   Limmie Patricia, OTR/L,CBIS   10/12/2012, 12:01 PM

## 2012-10-15 ENCOUNTER — Ambulatory Visit (HOSPITAL_COMMUNITY)
Admission: RE | Admit: 2012-10-15 | Discharge: 2012-10-15 | Disposition: A | Discharge status: Home / Self Care | Payer: BC Managed Care – PPO | Source: Ambulatory Visit | Attending: Family Medicine | Admitting: Family Medicine

## 2012-10-15 NOTE — Progress Notes (Signed)
Occupational Therapy Treatment Patient Details  Name: Connor Small MRN: [id number] Date of Birth: [birth year]  Today's Date: 10/15/2012 Time: 4098-1191 OT Time Calculation (min): 51 min ES 4782-9562 10' Theract 1308-6578 41'  Visit#: 31 of 48  Re-eval: 11/07/12    Authorization: n/a  Authorization Time Period:    Authorization Visit#:   of    Subjective Symptoms/Limitations Symptoms: S: Not being able to throw a ball is embaressing.  Pain Assessment Currently in Pain?: No/denies  Precautions/Restrictions  Precautions Precautions: None  Exercise/Treatments ROM / Strengthening / Isometric Strengthening Rebounder: [place]o ball; underhand and external to internal rotation throw; 10' i Other ROM/Strengthening Exercises: pushed sled around clinic X4. No rest breaks.    Hand Exercises Theraputty: Flatten Theraputty - Flatten: red- standing Other Hand Exercises: After red putty was flattened, patient worked on digit flexors and extensors.     Modalities Modalities: Copywriter, advertising Location: 1) Wrist and digit flexors  Electrical Stimulation Action: Doctor, hospital Parameters: Wrist extensors: 35 mZ CC, Cycle: 10/20 Electrical Stimulation Goals: Neuromuscular facilitation  Occupational Therapy Assessment and Plan OT Assessment and Plan Clinical Impression Statement: A: Increased difficulty releasing lighter ball during rebounder. Patient had great power behind throw. Patient was able to make 4 trips around clinic pushing sled without resting.  OT Plan: P: Cont. to work on pinch and grip strength needed to grab and release objects.    Goals Short Term Goals Time to Complete Short Term Goals: 6 weeks Short Term Goal 1: Patient will be educated on a HEP. Short Term Goal 2: Patient will weightbear on his RUE while completing ADLs with min pa. Short Term Goal 3: Patient will complete shower transfer with tub  transfer bench with SBA. Short Term Goal 4: Patient will don and doff pants with reacher with SBA for safety. Short Term Goal 5: Patient will improve RUE and hand AROM by 25% for increased ability to use RUE as an active assist with daily activities.  Additional Short Term Goals?: Yes Short Term Goal 6: Patient will improve right grip strength by 10 pounds for increased ability to open containers.  Short Term Goal 7: Patient will complete pinch testing and coordination testing.  Long Term Goals Long Term Goal 1: Patient will complete all B/IADLs, work, and leisure activities at the highest level of indepdendence possible, using his right hand as dominant.  Long Term Goal 1 Progress: Progressing toward goal Long Term Goal 2: Patient will have WFL AROM in his right arm and hand for increased use with all functional activities.  Long Term Goal 2 Progress: Progressing toward goal Long Term Goal 3: Patient will have 25# or more grip strength and 10# or more pinch strength in his right hand for increased ability to complete woodworking and metal working [employer].  Long Term Goal 4: Patient will improve fine motor coordination to be able to manipulate his ostomy and ostomy supplies independently.  Long Term Goal 5: Patient will have WFL strength in his right arm for increased ability to complete household tasks.  Long Term Goal 5 Progress: Progressing toward goal Additional Long Term Goals?: Yes Long Term Goal 6: new goal established 08/17/12:  Patient will increase FMC needed to manipulate buttons, coins, etc by completing Nine Hole Peg Test in 40" or less. Long Term Goal 7: New goal established 10/10/12: Patient will increase pinch strength to 10# in increase ability to complete woodworking and metal working activities.  Long Term Goal 7 Progress: Progressing  toward goal  Problem List Patient Active Problem List   Diagnosis Date Noted  . 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  . Right hand  weakness 08/10/2012  . Allergic rhinitis 06/28/2012  . Aspiration pneumonitis 04/26/2012  . Clostridium difficile colitis 04/26/2012  . Intra-abdominal fluid collection/LUQ 04/26/2012  . CKD (chronic kidney disease) stage 2, GFR 60-89 ml/min 04/26/2012  . Acute renal failure 04/26/2012  . Hypokalemia 04/26/2012  . Protein-calorie malnutrition, severe 04/25/2012  . Spleen hematoma - subcapsular & without rupture  04/25/2012  . Ascites s/p US guide paracentesis (4/2) 04/25/2012  . Ileostomy in place 04/24/2012  . HAP (hospital-acquired pneumonia) 04/21/2012  . 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 Acute blood loss anemia 04/18/2012  . S/p total colectomy for toxic megacolon, C diff 03/23/2012  . Hypernatremia, improving 03/23/2012  . Severe muscle deconditioning 03/23/2012  . Shoulder subluxation, right 03/23/2012  . 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 Coagulopathy 03/23/2012  . Quadriparesis 03/17/2012  . Leukocytosis, unspecified 03/15/2012  . Hyperbilirubinemia, due to cholestasis 03/15/2012  . PAF (paroxysmal atrial fibrillation) 03/15/2012  . Anemia of chronic disease 03/15/2012  . Pleural effusion, L > R 03/04/2012  . Sinus tachycardia 02/28/2012  . Hematuria 02/28/2012  . history of UC (ulcerative colitis) 12/06/2010    End of Session Activity Tolerance: Patient tolerated treatment well General Behavior During Therapy: [facility] for tasks assessed/performed   Limmie Patricia, OTR/L,CBIS   10/15/2012, 11:26 AM

## 2012-10-16 ENCOUNTER — Encounter: Payer: Self-pay | Admitting: Emergency Medicine

## 2012-10-16 ENCOUNTER — Ambulatory Visit (INDEPENDENT_AMBULATORY_CARE_PROVIDER_SITE_OTHER): Payer: BC Managed Care – PPO | Admitting: Emergency Medicine

## 2012-10-16 ENCOUNTER — Ambulatory Visit (INDEPENDENT_AMBULATORY_CARE_PROVIDER_SITE_OTHER)
Admission: RE | Admit: 2012-10-16 | Discharge: 2012-10-16 | Disposition: A | Discharge status: Home / Self Care | Payer: BC Managed Care – PPO | Source: Ambulatory Visit | Attending: Emergency Medicine | Admitting: Emergency Medicine

## 2012-10-16 VITALS — BP 96/58 | HR 76 | Temp 98.2°F | Ht 70.0 in | Wt 146.6 lb

## 2012-10-16 DIAGNOSIS — J90 Pleural effusion, not elsewhere classified: Secondary | ICD-10-CM

## 2012-10-16 NOTE — Patient Instructions (Addendum)
CXR today We will not perform thoracentesis at this time, but could consider this in the future if you have a change in your breathing Follow with Dr Lamonte Sakai on an as needed basis.

## 2012-10-16 NOTE — Assessment & Plan Note (Signed)
Doing very well clinically, has rehabbed well.  Still with presumed effusion based on exam, doesn't seem to be limiting him. Expect it would reaccumulate if drained. Will defer drainage at this time and follow clinically

## 2012-10-16 NOTE — Progress Notes (Signed)
Subjective:    Patient ID: Connor Small, male    DOB: 18-Mar-1960, 52 y.o.   MRN: [id number]  HPI 52 yo man, former tobacco (6 pk-yrs)  Long complicated hospitalization  >> Admitted to [facility] for Cdiff on 2/2. Underwent decompressive cecostomy 2/3, Hospital course complicated by ARDS, PNA, septic shock, bilateral cardioembolic CVAs, renal failure, wound dehiscence and required multiple OR visits. To rehab, aspiration event 3/29-->treated, back to rehab. PCCM asked once again to see on 4/30 for on going low gd temp, left effusion (which has been chronic since feb 2014, but has gotten a little worse).  Seen x 2 in f/u consultation on rehab for recurrent L effusion s/p thora x 2  Has been working on home health PT, hopefully to outpt PT. Feels that his breathing is doing well. Other things limit his activity - endurance. He has had a cough, clear drainage. No f/c/focal sx.   ROV 10/16/12 -- hx ARDS and PNA as above with recurrent L exudative effusion. Returns for f/u. He underwent thora under [place] last on 07/11/12 (1700cc). He walks regularly, he has finished PT, is still doing OT. He wants to go back to work when his R hand improves.   Review of Systems  Constitutional: Positive for unexpected weight change. Negative for fever.  HENT: Negative for ear pain, nosebleeds, congestion, sore throat, rhinorrhea, sneezing, trouble swallowing, dental problem, postnasal drip and sinus pressure.   Eyes: Negative for redness and itching.  Respiratory: Positive for cough and shortness of breath. Negative for chest tightness and wheezing.   Cardiovascular: Negative for palpitations and leg swelling.  Gastrointestinal: Negative for nausea and vomiting.  Genitourinary: Negative for dysuria.  Musculoskeletal: Negative for joint swelling.  Skin: Negative for rash.  Neurological: Negative for headaches.  Hematological: Does not bruise/bleed easily.  Psychiatric/Behavioral: Negative for dysphoric mood. The patient is  nervous/anxious.    Past Medical History  Diagnosis Date  . Chronic diarrhea   . Rectal bleed   . Hemorrhoids   . Ulcerative colitis     Distal UC over 8 yrs ago diagnosed  . Diverticulitis of large intestine with perforation 10/2011    done at [facility]  . 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 history of UC (ulcerative colitis) 12/06/2010     Family History  Problem Relation Age of Onset  . Cancer Sister   . Healthy Daughter   . Hypertension Mother      History   Social History  . Marital Status: Married    Spouse Name: [name]    Number of Children: 1  . 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 Smoking status: Former Smoker -- 0.50 packs/day for 12 years    Types: Cigarettes    Quit date: 07/17/1984  . Smokeless tobacco: Never Used     Comment: Quit 23 yrs ago  . Alcohol Use: No  . Drug Use: No  . Sexual Activity: Yes   Other Topics Concern  . Not on file   Social History Narrative   Patient lives at home with his spouse.   Caffeine Use: rarely     Allergies  Allergen Reactions  . Penicillins Other (See Comments)    Heart rate changes  . Claritin [Loratadine]     Rash  . Morphine And Related     Nausea/vomiting  . Oxycodone     Nausea/vomiting  . Imipenem Rash  Outpatient Prescriptions Prior to Visit  Medication Sig Dispense Refill  . aspirin 81 MG chewable tablet Place 1 tablet (81 mg total) into feeding tube daily.      . IRON PO Take 325 mg by mouth daily.       . Multiple Vitamin (MULTIVITAMIN) LIQD Take 5 mLs by mouth daily.      . clonazePAM (KLONOPIN) 0.5 MG tablet Take 0.5 tablets (0.25 mg total) by mouth at bedtime. For anxiety/sleep  30 tablet  1  . traMADol (ULTRAM) 50 MG tablet Place 1 tablet (50 mg total) into feeding tube every 6 (six) hours as needed. For pain  60 tablet  1   No facility-administered medications prior to visit.         Objective:   Physical  Exam Filed Vitals:   10/16/12 0948  BP: 96/58  Pulse: 76  Temp: 98.2 F (36.8 C)  TempSrc: Oral  Height: 5\' 10"  (1.778 m)  Weight: 146 lb 9.6 oz (66.497 kg)  SpO2: 100%    Gen: Pleasant, thin in wheelchair, in no distress,  normal affect  ENT: No lesions,  mouth clear,  oropharynx clear, no postnasal drip  Neck: No JVD, no TMG, no carotid bruits, slight hoarse voice  Lungs: No use of accessory muscles, no dullness to percussion, clear without rales or rhonchi  Cardiovascular: RRR, heart sounds normal, no murmur or gallops, no peripheral edema  Abdomen: soft and NT, ostomy   Musculoskeletal: No deformities, no cyanosis or clubbing  Neuro: alert, unable to move R UE, able to move B LE but weak  Skin: Warm, no lesions or rashes      Assessment & Plan:  Pleural effusion, L > R Doing very well clinically, has rehabbed well.  Still with presumed effusion based on exam, doesn't seem to be limiting him. Expect it would reaccumulate if drained. Will defer drainage at this time and follow clinically

## 2012-10-17 ENCOUNTER — Ambulatory Visit (HOSPITAL_COMMUNITY): Payer: BC Managed Care – PPO | Admitting: Specialist

## 2012-10-17 NOTE — Progress Notes (Signed)
Quick Note:  Informed pt wife of CXR results and she verbalized understanding and has no further questions or concerns ______

## 2012-10-18 ENCOUNTER — Ambulatory Visit (HOSPITAL_COMMUNITY)
Admission: RE | Admit: 2012-10-18 | Discharge: 2012-10-18 | Disposition: A | Discharge status: Home / Self Care | Payer: BC Managed Care – PPO | Source: Ambulatory Visit | Attending: Family Medicine | Admitting: Family Medicine

## 2012-10-18 NOTE — Progress Notes (Signed)
Occupational Therapy Treatment Patient Details  Name: Connor Small MRN: [id number] Date of Birth: 1960/03/26  Today's Date: 10/18/2012 Time: 6004-5997 OT Time Calculation (min): 45 min Therex 845-910 20' ES 910-925 15' Therex 925-930 5'  Visit#: 32 of 48  Re-eval: 11/07/12    Authorization: n/a  Authorization Time Period:    Authorization Visit#:   of    Subjective Symptoms/Limitations Symptoms: S: I feel like a got a good workout today.  Pain Assessment Currently in Pain?: No/denies  Precautions/Restrictions   None  Exercise/Treatments Standing Horizontal ABduction: Theraband;15 reps Theraband Level (Shoulder Horizontal ABduction): Level 3 (Green) External Rotation: 15 reps;Theraband Theraband Level (Shoulder External Rotation): Level 3 (Green) Internal Rotation: 15 reps;Theraband Theraband Level (Shoulder Internal Rotation): Level 3 (Green) Flexion: Theraband;15 reps Theraband Level (Shoulder Flexion): Level 3 (Green) Extension: Theraband;15 reps Theraband Level (Shoulder Extension): Level 3 (Green) Row: Theraband;15 reps Theraband Level (Shoulder Row): Level 3 (Green) Retraction: Theraband;15 reps Theraband Level (Shoulder Retraction): Level 3 (Green) ROM / Strengthening / Isometric Strengthening UBE (Upper Arm Bike): 3' and 3' 2.5 Cybex Press: 2 plate;20 reps Cybex Row: 2 plate;20 reps    Hand Exercises Other Hand Exercises: Resistive Clothespins; all colors placed/removed with Rt hand.      Modalities Modalities: Field seismologist Location: Wrist and digit extensors Electrical Stimulation Action: Designer, jewellery Parameters: Wrist extensors: 24 mA CC, Cycle: 10/20; 15' Electrical Stimulation Goals: Neuromuscular facilitation  Occupational Therapy Assessment and Plan OT Assessment and Plan Clinical Impression Statement: A: Focused on UB strengthening and endurance for first portion of  tx session. Patient tolerated well with some fatigue noted.  OT Plan: P: Cont. to work on pinch and grip strength needed to grab and release objects.    Goals    Problem List Patient Active Problem List   Diagnosis Date Noted  . 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  . Right hand weakness 08/10/2012  . Allergic rhinitis 06/28/2012  . Aspiration pneumonitis 04/26/2012  . Clostridium difficile colitis 04/26/2012  . Intra-abdominal fluid collection/LUQ 04/26/2012  . CKD (chronic kidney disease) stage 2, GFR 60-89 ml/min 04/26/2012  . Acute renal failure 04/26/2012  . Hypokalemia 04/26/2012  . Protein-calorie malnutrition, severe 04/25/2012  . Spleen hematoma - subcapsular & without rupture  04/25/2012  . Ascites s/p US guide paracentesis (4/2) 04/25/2012  . Ileostomy in place 04/24/2012  . HAP (hospital-acquired pneumonia) 04/21/2012  . 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 Acute blood loss anemia 04/18/2012  . S/p total colectomy for toxic megacolon, C diff 03/23/2012  . Hypernatremia, improving 03/23/2012  . Severe muscle deconditioning 03/23/2012  . Shoulder subluxation, right 03/23/2012  . 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 Coagulopathy 03/23/2012  . Quadriparesis 03/17/2012  . Leukocytosis, unspecified 03/15/2012  . Hyperbilirubinemia, due to cholestasis 03/15/2012  . PAF (paroxysmal atrial fibrillation) 03/15/2012  . Anemia of chronic disease 03/15/2012  . Pleural effusion, L > R 03/04/2012  . Sinus tachycardia 02/28/2012  . Hematuria 02/28/2012  . history of UC (ulcerative colitis) 12/06/2010    End of Session Activity Tolerance: Patient tolerated treatment well General Behavior During Therapy: [facility] for tasks assessed/performed   Ailene Ravel, OTR/L,CBIS   10/18/2012, 10:40 AM

## 2012-10-19 ENCOUNTER — Ambulatory Visit (HOSPITAL_COMMUNITY)
Admission: RE | Admit: 2012-10-19 | Discharge: 2012-10-19 | Disposition: A | Discharge status: Home / Self Care | Payer: BC Managed Care – PPO | Source: Ambulatory Visit | Attending: Family Medicine | Admitting: Family Medicine

## 2012-10-19 NOTE — Progress Notes (Signed)
Occupational Therapy Treatment Patient Details  Name: Connor ABEGGLEN MRN: [id number] Date of Birth: May 23, 1960  Today's Date: 10/19/2012 Time: 4010-2725 OT Time Calculation (min): 51 min Therex 3664-4034 27' ES 7425-9563 10' Therex 8756-4332 14'   Visit#: 33 of 48  Re-eval: 11/07/12    Authorization: n/a  Authorization Time Period:    Authorization Visit#:   of    Subjective    Precautions/Restrictions  Precautions Precautions: None  Exercise/Treatments Standing Horizontal ABduction: Theraband;15 reps Theraband Level (Shoulder Horizontal ABduction): Level 3 (Green) External Rotation: 15 reps;Theraband Theraband Level (Shoulder External Rotation): Level 3 (Green) Internal Rotation: 15 reps;Theraband Theraband Level (Shoulder Internal Rotation): Level 3 (Green) Flexion: Theraband;15 reps Theraband Level (Shoulder Flexion): Level 3 (Green) Extension: Theraband;15 reps Theraband Level (Shoulder Extension): Level 3 (Green) Row: Theraband;15 reps Theraband Level (Shoulder Row): Level 3 (Green) Retraction: Theraband;15 reps Theraband Level (Shoulder Retraction): Level 3 (Green) ROM / Strengthening / Isometric Strengthening UBE (Upper Arm Bike): 3' and 3' 2.5 Cybex Press: 2 plate;20 reps Cybex Row: 2 plate;20 reps    Hand Exercises Theraputty: Flatten;Roll;Grip Theraputty - Flatten: green- standing Theraputty - Roll: green Theraputty - Grip: green     Museum/gallery exhibitions officer Stimulation Location: Wrist and digit flexors Electrical Stimulation Action: [place] Electrical Stimulation Parameters: Wrist flexors: 33 mA CC, Cycle: 10/20; 10' Electrical Stimulation Goals: Neuromuscular facilitation  Occupational Therapy Assessment and Plan OT Assessment and Plan Clinical Impression Statement: A: Patient completed UB strengthening exercises with less fatigue this date. Continues to have difficulty fulling flexing and extending digits of right hand. Stated that he  only had 15 therapy visits left. Sees Dr. Riley Kill on Monday and will discuss possible surgery on right hand to increase functional movement.  OT Plan: P: Cont. to work on pinch and grip strength needed to grab and release objects.    Goals Short Term Goals Time to Complete Short Term Goals: 6 weeks Short Term Goal 1: Patient will be educated on a HEP. Short Term Goal 2: Patient will weightbear on his RUE while completing ADLs with min pa. Short Term Goal 3: Patient will complete shower transfer with tub transfer bench with SBA. Short Term Goal 4: Patient will don and doff pants with reacher with SBA for safety. Short Term Goal 5: Patient will improve RUE and hand AROM by 25% for increased ability to use RUE as an active assist with daily activities.  Additional Short Term Goals?: Yes Short Term Goal 6: Patient will improve right grip strength by 10 pounds for increased ability to open containers.  Short Term Goal 7: Patient will complete pinch testing and coordination testing.  Long Term Goals Long Term Goal 1: Patient will complete all B/IADLs, work, and leisure activities at the highest level of indepdendence possible, using his right hand as dominant.  Long Term Goal 2: Patient will have WFL AROM in his right arm and hand for increased use with all functional activities.  Long Term Goal 3: Patient will have 25# or more grip strength and 10# or more pinch strength in his right hand for increased ability to complete woodworking and metal working [employer].  Long Term Goal 4: Patient will improve fine motor coordination to be able to manipulate his ostomy and ostomy supplies independently.  Long Term Goal 5: Patient will have WFL strength in his right arm for increased ability to complete household tasks.  Additional Long Term Goals?: Yes Long Term Goal 6: new goal established 08/17/12:  Patient will increase FMC needed to manipulate  buttons, coins, etc by completing Nine Hole Peg Test in 40" or  less. Long Term Goal 7: New goal established 10/10/12: Patient will increase pinch strength to 10# in increase ability to complete woodworking and metal working activities.   Problem List Patient Active Problem List   Diagnosis Date Noted  . 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  . Right hand weakness 08/10/2012  . Allergic rhinitis 06/28/2012  . Aspiration pneumonitis 04/26/2012  . Clostridium difficile colitis 04/26/2012  . Intra-abdominal fluid collection/LUQ 04/26/2012  . CKD (chronic kidney disease) stage 2, GFR 60-89 ml/min 04/26/2012  . Acute renal failure 04/26/2012  . Hypokalemia 04/26/2012  . Protein-calorie malnutrition, severe 04/25/2012  . Spleen hematoma - subcapsular & without rupture  04/25/2012  . Ascites s/p US guide paracentesis (4/2) 04/25/2012  . Ileostomy in place 04/24/2012  . HAP (hospital-acquired pneumonia) 04/21/2012  . 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 Acute blood loss anemia 04/18/2012  . S/p total colectomy for toxic megacolon, C diff 03/23/2012  . Hypernatremia, improving 03/23/2012  . Severe muscle deconditioning 03/23/2012  . Shoulder subluxation, right 03/23/2012  . 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 Coagulopathy 03/23/2012  . Quadriparesis 03/17/2012  . Leukocytosis, unspecified 03/15/2012  . Hyperbilirubinemia, due to cholestasis 03/15/2012  . PAF (paroxysmal atrial fibrillation) 03/15/2012  . Anemia of chronic disease 03/15/2012  . Pleural effusion, L > R 03/04/2012  . Sinus tachycardia 02/28/2012  . Hematuria 02/28/2012  . history of UC (ulcerative colitis) 12/06/2010    End of Session Activity Tolerance: Patient tolerated treatment well General Behavior During Therapy: [facility] for tasks assessed/performed   Limmie Patricia, OTR/L,CBIS   10/19/2012, 11:31 AM

## 2012-10-22 ENCOUNTER — Encounter: Payer: Self-pay | Admitting: Physical Medicine & Rehabilitation

## 2012-10-22 ENCOUNTER — Ambulatory Visit (HOSPITAL_COMMUNITY)
Admission: RE | Admit: 2012-10-22 | Discharge: 2012-10-22 | Disposition: A | Discharge status: Home / Self Care | Payer: BC Managed Care – PPO | Source: Ambulatory Visit | Attending: Family Medicine | Admitting: Family Medicine

## 2012-10-22 ENCOUNTER — Encounter
Payer: BC Managed Care – PPO | Attending: Physical Medicine & Rehabilitation | Admitting: Physical Medicine & Rehabilitation

## 2012-10-22 VITALS — BP 96/65 | HR 78 | Resp 14 | Ht 70.0 in | Wt 147.0 lb

## 2012-10-22 DIAGNOSIS — S5421XS Injury of radial nerve at forearm level, right arm, sequela: Secondary | ICD-10-CM

## 2012-10-22 DIAGNOSIS — E46 Unspecified protein-calorie malnutrition: Secondary | ICD-10-CM | POA: Insufficient documentation

## 2012-10-22 DIAGNOSIS — R5381 Other malaise: Secondary | ICD-10-CM | POA: Insufficient documentation

## 2012-10-22 DIAGNOSIS — M24541 Contracture, right hand: Secondary | ICD-10-CM

## 2012-10-22 DIAGNOSIS — S5420XA Injury of radial nerve at forearm level, unspecified arm, initial encounter: Secondary | ICD-10-CM | POA: Insufficient documentation

## 2012-10-22 DIAGNOSIS — G7281 Critical illness myopathy: Secondary | ICD-10-CM | POA: Insufficient documentation

## 2012-10-22 DIAGNOSIS — M24549 Contracture, unspecified hand: Secondary | ICD-10-CM

## 2012-10-22 DIAGNOSIS — F411 Generalized anxiety disorder: Secondary | ICD-10-CM | POA: Insufficient documentation

## 2012-10-22 DIAGNOSIS — J90 Pleural effusion, not elsewhere classified: Secondary | ICD-10-CM | POA: Insufficient documentation

## 2012-10-22 NOTE — Patient Instructions (Signed)
CALL ME WITH ANY PROBLEMS OR QUESTIONS (#[phone]).  HAVE A GOOD DAY  

## 2012-10-22 NOTE — Progress Notes (Signed)
Subjective:    Patient ID: Connor Small, male    DOB: 03/14/1960, 52 y.o.   MRN: [id number]  HPI  Mr. Breed is back regarding his deconditioning . He is off all adaptive equipment. He still lacks ROM in his right hand. He was told by OT that his middle finger is inflamed.  He feels the dorsum of his right thumb is numb still. He has been working with OT a couple times a week since we last met but his progress is slowing down.   He has graduated from PT and is independent with mobility. He is walking regularly with his wife. They do some trail walking now in fact. His stamina and balance are not where he wants them to be yet.   Pain levels are really non-existent. His HR has dropped to WNL and his BP's are a little better.   Pain Inventory Average Pain 1 Pain Right Now 0 My pain is intermittent and aching  In the last 24 hours, has pain interfered with the following? General activity 0 Relation with others 0 Enjoyment of life 0 What TIME of day is your pain at its worst? evening Sleep (in general) Fair  Pain is worse with: some activites Pain improves with: rest Relief from Meds: n/a  Mobility walk without assistance how many minutes can you walk? 30 ability to climb steps?  yes do you drive?  yes  Function not employed: date last employed 01/14 disabled: date disabled 02/14 Do you have any goals in this area?  yes  Neuro/Psych weakness tingling loss of taste or smell  Prior Studies x-rays  Physicians involved in your care [place], Pearlean Brownie, [place], [place]   Family History  Problem Relation Age of Onset  . Cancer Sister   . Healthy Daughter   . Hypertension Mother    History   Social History  . Marital Status: Married    Spouse Name: [name]    Number of Children: 1  . 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 Smoking status: Former Smoker -- 0.50 packs/day for 12 years    Types:  Cigarettes    Quit date: 07/17/1984  . Smokeless tobacco: Never Used     Comment: Quit 23 yrs ago  . Alcohol Use: No  . Drug Use: No  . Sexual Activity: Yes   Other Topics Concern  . None   Social History Narrative   Patient lives at home with his spouse.   Caffeine Use: rarely   Past Surgical History  Procedure Laterality Date  . Colonoscopy  9/03  . Sigmoidoscopy       06/13/2002  . Temporary ostomy November 06, 2010      for a colon perforation that was done in [facility] (Dr Ruben Im).    . Colonoscopy  02/16/2011    Procedure: COLONOSCOPY;  Surgeon: Malissa Hippo, MD;  Location: [facility];  Service: Endoscopy;  Laterality: N/A;  100  . Flexible sigmoidoscopy  02/27/2012    Procedure: FLEXIBLE SIGMOIDOSCOPY;  Surgeon: Malissa Hippo, MD;  Location: [facility];  Service: Endoscopy;  Laterality: N/A;  with colonic decompression  . Laparotomy  02/27/2012    Procedure: EXPLORATORY LAPAROTOMY;  Surgeon: Fabio Bering, MD;  Location: [facility];  Service: General;  Laterality: N/A;  . Cecostomy  02/27/2012    Procedure: CECOSTOMY;  Surgeon: Fabio Bering, MD;  Location: [facility];  Service: General;  Laterality: N/A;  Cecostomy Tube Placement  . Colectomy  02/28/2012    Procedure: TOTAL COLECTOMY;  Surgeon: Fabio Bering, MD;  Location: [facility];  Service: General;  Laterality: N/A;  . Ileostomy  02/28/2012    Procedure: ILEOSTOMY;  Surgeon: Fabio Bering, MD;  Location: [facility];  Service: General;  Laterality: N/A;  . Cystoscopy w/ ureteral stent placement Bilateral 03/20/2012    Procedure: CYSTOSCOPY WITH RETROGRADE PYELOGRAM/URETERAL STENT PLACEMENT;  Surgeon: Sebastian Ache, MD;  Location: [facility] OR;  Service: Urology;  Laterality: Bilateral;  . Laparotomy N/A 03/26/2012    Procedure: EXPLORATORY LAPAROTOMY;  Surgeon: Mariella Saa, MD;  Location: [facility] OR;  Service: General;  Laterality: N/A;  . Application of wound vac N/A 03/26/2012    Procedure: APPLICATION OF WOUND VAC;  Surgeon:  Mariella Saa, MD;  Location: [facility] OR;  Service: General;  Laterality: N/A;  . Liver biopsy N/A 03/26/2012    Procedure: LIVER BIOPSY;  Surgeon: Mariella Saa, MD;  Location: [facility] OR;  Service: General;  Laterality: N/A;  . Laparotomy N/A 03/29/2012    Procedure: EXPLORATORY LAPAROTOMY, PARTIAL WOUND CLOSURE;  Surgeon: Mariella Saa, MD;  Location: [facility] OR;  Service: General;  Laterality: N/A;  . Vacuum assisted closure change N/A 03/29/2012    Procedure: Open ABDOMINAL VACUUM  CHANGE;  Surgeon: Mariella Saa, MD;  Location: [facility] OR;  Service: General;  Laterality: N/A;  . Vacuum assisted closure change N/A 04/01/2012    Procedure: removal of abdominal vac dressing and abdominal closure;  Surgeon: Mariella Saa, MD;  Location: [facility];  Service: General;  Laterality: N/A;   Past Medical History  Diagnosis Date  . Chronic diarrhea   . Rectal bleed   . Hemorrhoids   . Ulcerative colitis     Distal UC over 8 yrs ago diagnosed  . Diverticulitis of large intestine with perforation 10/2011    done at [facility]  . 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 history of UC (ulcerative colitis) 12/06/2010   BP 96/65  Pulse 78  Resp 14  Ht 5\' 10"  (1.778 m)  Wt 147 lb (66.679 kg)  BMI 21.09 kg/m2  SpO2 98%     Review of Systems  Constitutional: Positive for appetite change.  Neurological: Positive for weakness.  All other systems reviewed and are negative.       Objective:   Physical Exam General: Alert and oriented x 3, No apparent distress. He has put on weight.  HEENT: Head is normocephalic, atraumatic, PERRLA, EOMI, sclera anicteric, oral mucosa pink and moist, dentition intact, ext ear canals clear,  Neck: Supple without JVD or lymphadenopathy  Heart: Reg rate and rhythm. No murmurs rubs or gallops  Chest: CTA bilaterally without wheezes, rales, or rhonchi; no distress  Abdomen: Soft, non-tender, non-distended, bowel sounds positive. Ostomy site intact.  Extremities: No  clubbing, cyanosis, or edema. Pulses are 2+  Skin: Clean and intact without signs of breakdown  Neuro: Pt is cognitively appropriate with normal insight, memory, and awareness. Cranial nerves 2-12 are intact. Sensory exam is normal. Reflexes are 1+ on the left, 2+ on the right. Fine motor coordination is intact. No tremors. Motor function is grossly 3+ to 4/5 LE with tight hamstrings noted. RUE is 3+/5 deltoid, 3+ to 4 BICEPS, TRICEPS, WE is 2+ to 3/5. HI are 2 to 3+ but limited by contracture extension contractures. He is unable to make a fist or oppose fingers due tightness.Right hand is contracted  along PIP's and MCP's primarily.   Musculoskeletal: He tends to lean into the right leg with weight bearing. He denies pain. Psych: Pt's affect is appropriate. Pt is cooperative. Non anxious  Assessment & Plan:   1. Critical illness myopathy/severe deconditioning  2. 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  3. Anxiety  4. 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 Left pleuaral effusion  6. Protein malnutrition   Plan:  1. Will consult hand surgery, Dr. Carolynn Comment for potential surgical manipulation of the hand. I believe he has sufficient neurological function in the hand to do quite well with such a procedure. I would hold off on further OT for the time being.   2. Follow up with me in 6 months. All questions were encouraged and answered.

## 2012-10-22 NOTE — Progress Notes (Signed)
Occupational Therapy Treatment Patient Details  Name: Connor Small MRN: [id number] Date of Birth: 1960-03-19  Today's Date: 10/22/2012 Time: 0160-1093 OT Time Calculation (min): 45 min Neuro reed 20' Therapeutic exercises 25' Visit#: 34 of 48  Re-eval: 10/22/12    Subjective  S:  I went to the MD today, he recommended stopping therapy and referred me to a hand surgeon for possible surgery. Pain Assessment Currently in Pain?: No/denies Pain Score: 0-No pain  Precautions/Restrictions   n/a  Exercise/Treatments Hand Exercises Theraputty: Flatten;Roll;Grip Theraputty - Flatten: green- standing Theraputty - Roll: green Theraputty - Grip: green Theraputty - Locate Pegs: 10 - green Other Hand Exercises: Resistive Clothespins; all colors placed/removed with Rt hand.         Occupationl Therapy Assessment and Plan OT Assessment and Plan Clinical Impression Statement: A:  Reviewed HEP and updated resistance of tputty to green mod resist.   OT Plan: P:  Dc from skilled OT intervention per MD order.    Goals Short Term Goals Time to Complete Short Term Goals: 6 weeks Short Term Goal 1: Patient will be educated on a HEP. Short Term Goal 2: Patient will weightbear on his RUE while completing ADLs with min pa. Short Term Goal 3: Patient will complete shower transfer with tub transfer bench with SBA. Short Term Goal 4: Patient will don and doff pants with reacher with SBA for safety. Short Term Goal 5: Patient will improve RUE and hand AROM by 25% for increased ability to use RUE as an active assist with daily activities.  Additional Short Term Goals?: Yes Short Term Goal 6: Patient will improve right grip strength by 10 pounds for increased ability to open containers.  Short Term Goal 7: Patient will complete pinch testing and coordination testing.  Long Term Goals Long Term Goal 1: Patient will complete all B/IADLs, work, and leisure activities at the highest level of  indepdendence possible, using his right hand as dominant.  Long Term Goal 1 Progress: Progressing toward goal Long Term Goal 2: Patient will have WFL AROM in his right arm and hand for increased use with all functional activities.  Long Term Goal 2 Progress: Progressing toward goal Long Term Goal 3: Patient will have 25# or more grip strength and 10# or more pinch strength in his right hand for increased ability to complete woodworking and metal working [employer].  Long Term Goal 3 Progress: Partly met Long Term Goal 4: Patient will improve fine motor coordination to be able to manipulate his ostomy and ostomy supplies independently.  Long Term Goal 4 Progress: Met Long Term Goal 5: Patient will have WFL strength in his right arm for increased ability to complete household tasks.  Long Term Goal 5 Progress: Progressing toward goal Additional Long Term Goals?: Yes Long Term Goal 6: new goal established 08/17/12:  Patient will increase FMC needed to manipulate buttons, coins, etc by completing Nine Hole Peg Test in 40" or less. Long Term Goal 6 Progress: Met Long Term Goal 7: New goal established 10/10/12: Patient will increase pinch strength to 10# in increase ability to complete woodworking and metal working activities.  Long Term Goal 7 Progress: Progressing toward goal  Problem List Patient Active Problem List   Diagnosis Date Noted  . 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  . 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  . Right hand weakness 08/10/2012  . Allergic rhinitis 06/28/2012  . Aspiration pneumonitis 04/26/2012  . Clostridium difficile colitis 04/26/2012  . Intra-abdominal fluid collection/LUQ  04/26/2012  . CKD (chronic kidney disease) stage 2, GFR 60-89 ml/min 04/26/2012  . Acute renal failure 04/26/2012  . Hypokalemia 04/26/2012  . Protein-calorie malnutrition, severe 04/25/2012  . Spleen hematoma - subcapsular & without rupture  04/25/2012  . Ascites s/p US guide paracentesis  (4/2) 04/25/2012  . Ileostomy in place 04/24/2012  . HAP (hospital-acquired pneumonia) 04/21/2012  . 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 Acute blood loss anemia 04/18/2012  . S/p total colectomy for toxic megacolon, C diff 03/23/2012  . Hypernatremia, improving 03/23/2012  . Severe muscle deconditioning 03/23/2012  . Shoulder subluxation, right 03/23/2012  . 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 Coagulopathy 03/23/2012  . Quadriparesis 03/17/2012  . Leukocytosis, unspecified 03/15/2012  . Hyperbilirubinemia, due to cholestasis 03/15/2012  . PAF (paroxysmal atrial fibrillation) 03/15/2012  . Anemia of chronic disease 03/15/2012  . Pleural effusion, L > R 03/04/2012  . Sinus tachycardia 02/28/2012  . Hematuria 02/28/2012  . history of UC (ulcerative colitis) 12/06/2010    End of Session Activity Tolerance: Patient tolerated treatment well General Behavior During Therapy: WFL for tasks assessed/performed OT Plan of Care OT Home Exercise Plan: Reviewed functional activites as well as strengthening of hand and arm.   GO    Sondra Barges [facility] 10/22/2012, 4:21 PM

## 2012-10-24 ENCOUNTER — Ambulatory Visit (HOSPITAL_COMMUNITY): Payer: BC Managed Care – PPO | Admitting: Specialist

## 2012-10-26 ENCOUNTER — Ambulatory Visit (INDEPENDENT_AMBULATORY_CARE_PROVIDER_SITE_OTHER): Payer: BC Managed Care – PPO | Admitting: General Surgery

## 2012-10-26 ENCOUNTER — Ambulatory Visit (HOSPITAL_COMMUNITY): Payer: BC Managed Care – PPO

## 2012-10-26 ENCOUNTER — Encounter (INDEPENDENT_AMBULATORY_CARE_PROVIDER_SITE_OTHER): Payer: Self-pay | Admitting: General Surgery

## 2012-10-26 VITALS — BP 100/68 | HR 72 | Temp 97.6°F | Resp 14 | Ht 70.0 in | Wt 147.4 lb

## 2012-10-26 DIAGNOSIS — Z932 Ileostomy status: Secondary | ICD-10-CM

## 2012-10-26 NOTE — Progress Notes (Signed)
Chief complaint: Followup abdominal surgery  History: Patient returns for more long-term followup after an extensive hospitalization. He has a history of recent stroke and then developed severe C. Difficile colitis. At [facility] he underwent initially placement of a cecostomy tube and then subsequent total abdominal colectomy with ileostomy. He was transferred to [place] for further care. In the early postoperative period he developed wound dehiscence and evisceration which was treated with an abdominal wound VAC and gradual closure of his abdominal wall over about a week. He was hospitalized for a couple of months and was in rehabilitation for about one month and now is back home.He continues to make very significant progress in his recovery. He has gained about 15 pounds since I saw him last. He is working in the garden walking regularly. He would like to return to work. He is 28 interested in having his ostomy eventually reversed. He does have a significant history of what sounds like a Hartmann colectomy and ostomy takedown in [place] a couple of years before this previous illness as well. Review of [facility] the time of his total abdominal colectomy indicated his rectum was divided at approximately the peritoneal reflection. He has no problems with his midline wound is completely healed and his ileostomy is functioning well. Exam: BP 100/68  Pulse 72  Temp(Src) 97.6 F (36.4 C) (Temporal)  Resp 14  Ht 5' 10"  (1.778 m)  Wt 147 lb 6.4 oz (66.86 kg)  BMI 21.15 kg/m2  A thin But well appearing Caucasian male who looking much better than his last visit Abdomen: Midline wound is completely healed. Soft and nontender..  I  Assessment and plan: Status post emergency total colectomy and ileostomy and subsequent wound dehiscence.  Also history of colostomy and colostomy takedown previous to this. He continues to make very significant progress. He  strongly once his ileostomy taken down I think he would be a candidate for this. I told them I would like him to continue recovering gain weight and get one year after a severe illness. I'm going to obtain operative reports from [facility] to see what his previous colon surgery was. I think would likely want to get at least a barium enema exam of his rectum or possibly a sigmoidoscopy to evaluate this preoperatively. I will see him back in December and likely due to his workup at that time.

## 2012-10-29 ENCOUNTER — Ambulatory Visit (HOSPITAL_COMMUNITY): Payer: BC Managed Care – PPO | Admitting: Specialist

## 2012-10-30 ENCOUNTER — Encounter: Payer: Self-pay | Admitting: Nurse Practitioner

## 2012-10-30 ENCOUNTER — Ambulatory Visit (INDEPENDENT_AMBULATORY_CARE_PROVIDER_SITE_OTHER): Payer: BC Managed Care – PPO | Admitting: Nurse Practitioner

## 2012-10-30 VITALS — BP 107/60 | HR 79 | Temp 98.3°F | Ht 68.0 in | Wt 150.0 lb

## 2012-10-30 DIAGNOSIS — I6350 Cerebral infarction due to unspecified occlusion or stenosis of unspecified cerebral artery: Secondary | ICD-10-CM

## 2012-10-30 DIAGNOSIS — G7281 Critical illness myopathy: Secondary | ICD-10-CM

## 2012-10-30 DIAGNOSIS — R29898 Other symptoms and signs involving the musculoskeletal system: Secondary | ICD-10-CM

## 2012-10-30 DIAGNOSIS — M6281 Muscle weakness (generalized): Secondary | ICD-10-CM

## 2012-10-30 NOTE — Progress Notes (Signed)
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 h/o ulcerative colitis and diverticulitis s/p ileostomy  He is seen today for f/u after hospital consult for stroke In March 2014.he was found to have altered mental status and generalized weakness especially right hand In setting of sepsis following diverticulitis and infection.he also had transient atrial fibrillation but due to low platelets and abnormal LFTs he was not started on anticoagulation but aspirin 81 mg instead. MRI showed bilateral small acute and subacute bicerebral and right cerebellar infarcts.MRA showed no large vessel occlusion.MRI C spine ws recommended given asymmetric right hand weakness but not done due to his sickness. He was also felt to have component of critical illness neuropathy/myopathy. He has obtained gradual improvement in his stamina and strength with rehab and therapy but still has significant residual right hand weakness.he Denies numbnesss, tingling, myalgias. He can walk short distances unassisted but uses walker for long distances.   UPDATE 10/30/12 (LL):  Connor Small comes back to office for follow up.  He is doing very well.  He denies numbnesss, tingling, myalgias.  He has a right hand surgery planned with Dr. Mina Marble to try to correct contractures to try to regain more functional usage of his right hand.  He hopes to be able to return to work at the start of the year.  Strength and stamina are much improved.  Taking only baby aspirin, iron and multivitamin now.  REVIEW OF SYSTEMS: Full 14 system review of systems performed and notable only for:  Constitutional: weight loss (gaining back)  Cardiovascular: N/A  Ear/Nose/Throat: ringing in  ears Skin: N/A  Eyes: N/A  Respiratory: N/A  Gastroitestinal: Illeostomy Genitourinary: N/A Hematology/Lymphatic: anemia  Endocrine: N/A Musculoskeletal:N/A  Allergy/Immunology: runny nose Neurological: slight numbness right hand Psychiatric: change in appetite (getting better) Sleep: N/A   ALLERGIES: Allergies  Allergen Reactions  . Penicillins Other (See Comments)    Heart rate changes  . Claritin [Loratadine]     Rash  . Morphine And Related     Nausea/vomiting  . Oxycodone     Nausea/vomiting  . Imipenem Rash    HOME MEDICATIONS: Outpatient Prescriptions Prior to Visit  Medication Sig Dispense Refill  . IRON PO Take 325 mg by mouth daily.       . Multiple Vitamin (MULTIVITAMIN) LIQD Take 5 mLs by mouth daily.      Marland Kitchen aspirin 81 MG chewable tablet Place 1 tablet (81 mg total) into feeding tube daily.       No facility-administered medications prior to visit.    PAST MEDICAL HISTORY: Past Medical History  Diagnosis Date  . Chronic diarrhea   . Rectal bleed   . Hemorrhoids   . Ulcerative colitis     Distal UC over 8 yrs ago diagnosed  . Diverticulitis of large intestine with perforation 10/2011    done at [facility]  . 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 history of UC (ulcerative colitis) 12/06/2010    PAST SURGICAL HISTORY: Past Surgical History  Procedure Laterality Date  . Colonoscopy  9/03  . Sigmoidoscopy       06/13/2002  . Temporary ostomy November 06, 2010      for a colon perforation that was done in [facility] (Dr Ruben Im).    Marland Kitchen  Colonoscopy  02/16/2011    Procedure: COLONOSCOPY;  Surgeon: Malissa Hippo, MD;  [facility];  Service: Endoscopy;  Laterality: N/A;  100  . Flexible sigmoidoscopy  02/27/2012    Procedure: FLEXIBLE SIGMOIDOSCOPY;  Surgeon: Malissa Hippo, MD;  [facility]: [facility];  Service: Endoscopy;  Laterality: N/A;  with colonic decompression  . Laparotomy  02/27/2012    Procedure: EXPLORATORY LAPAROTOMY;  Surgeon: Fabio Bering, MD;  Location: [facility] OR[facility];  Service: General;  Laterality: N/A;  . Cecostomy  02/27/2012    Procedure: CECOSTOMY;  Surgeon: Fabio Bering, MD;  Location: [facility];  Service: General;  Laterality: N/A;  Cecostomy Tube Placement  . Colectomy  02/28/2012    Procedure: TOTAL COLECTOMY;  Surgeon: Fabio Bering, MD;  Location: [facility];  Service: General;  Laterality: N/A;  . Ileostomy  02/28/2012    Procedure: ILEOSTOMY;  Surgeon: Fabio Bering, MD;  Location: [facility];  Service: General;  Laterality: N/A;  . Cystoscopy w/ ureteral stent placement Bilateral 03/20/2012    Procedure: CYSTOSCOPY WITH RETROGRADE PYELOGRAM/URETERAL STENT PLACEMENT;  Surgeon: Sebastian Ache, MD;  Location: [facility] OR;  Service: Urology;  Laterality: Bilateral;  . Laparotomy N/A 03/26/2012    Procedure: EXPLORATORY LAPAROTOMY;  Surgeon: Mariella Saa, MD;  Location: [facility] OR;  Service: General;  Laterality: N/A;  . Application of wound vac N/A 03/26/2012    Procedure: APPLICATION OF WOUND VAC;  Surgeon: Mariella Saa, MD;  Location: [facility] OR;  Service: General;  Laterality: N/A;  . Liver biopsy N/A 03/26/2012    Procedure: LIVER BIOPSY;  Surgeon: Mariella Saa, MD;  Location: [facility] OR;  Service: General;  Laterality: N/A;  . Laparotomy N/A 03/29/2012    Procedure: EXPLORATORY LAPAROTOMY, PARTIAL WOUND CLOSURE;  Surgeon: Mariella Saa, MD;  Location: [facility] OR;  Service: General;  Laterality: N/A;  . Vacuum assisted closure change N/A 03/29/2012    Procedure: Open ABDOMINAL VACUUM  CHANGE;  Surgeon: Mariella Saa, MD;  Location: [facility] OR;  Service: General;  Laterality: N/A;  . Vacuum assisted closure change N/A 04/01/2012    Procedure: removal of abdominal vac dressing and abdominal closure;  Surgeon: Mariella Saa, MD;  Location: [facility] OR;  Service: General;  Laterality: N/A;    FAMILY HISTORY: Family History  Problem Relation Age of Onset  . Cancer Sister   . Healthy Daughter   . Hypertension Mother     SOCIAL  HISTORY: History   Social History  . Marital Status: Married    Spouse Name: Connor Small    Number of Children: 1  . 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 Smoking status: Former Smoker -- 0.50 packs/day for 12 years    Types: Cigarettes    Quit date: 07/17/1984  . Smokeless tobacco: Never Used     Comment: Quit 23 yrs ago  . Alcohol Use: No  . Drug Use: No  . Sexual Activity: Yes   Other Topics Concern  . Not on file   Social History Narrative   Patient lives at home with his spouse.   Caffeine Use: rarely     PHYSICAL EXAM  Filed Vitals:   10/30/12 1413  BP: 107/60  Pulse: 79  Temp: 98.3 F (36.8 C)  TempSrc: Oral  Height: 5\' 8"  (1.727 m)  Weight: 150 lb (68.04 kg)   Body mass index is 22.81 kg/(m^2).  Generalized: cachectic middle aged male, seated, in no evident distress  Head: normocephalic and atraumatic. Oropharynx benign  Neck: Supple, no carotid bruits  Cardiac: Regular rate rhythm, no murmur  Musculoskeletal: No deformity   Neurological examination  Mentation: Alert oriented to time, place, history taking. Follows all commands speech and language fluent Cranial nerve II-XII: Pupils were equal round reactive to light extraocular movements were full, visual field were full on confrontational test. Facial sensation and strength were normal. hearing was intact to finger rubbing bilaterally. Uvula tongue midline. head turning and shoulder shrug and were normal and symmetric.Tongue protrusion into cheek strength was normal. Motor: normal tone, full strength in the BLE, no pronator drift.  mild wasting both hands. Weakness right grip and intrinsic hand muscles.  nomal LE strength.  Sensory: normal and symmetric to light touch, pinprick, and  vibration  Coordination: finger-nose-finger, heel-to-shin bilaterally, no dysmetria Reflexes: Brachioradialis 2/2, biceps 2/2, triceps 2/2, patellar 2/2,  Achilles 2/2, plantar responses were flexor bilaterally. Gait and Station: Rising up from seated position without assistance, normal stance, without trunk ataxia, moderate stride, good arm swing, smooth turning, able to perform tiptoe, and heel walking without difficulty.   DIAGNOSTIC DATA (LABS, IMAGING, TESTING) - I reviewed patient records, labs, notes, testing and imaging myself where available.  Lab Results  Component Value Date   WBC 7.4 09/27/2012   HGB 11.4* 09/27/2012   HCT 34.3* 09/27/2012   MCV 94.2 09/27/2012   PLT 300 09/27/2012      Component Value Date/Time   NA 140 09/27/2012 0714   K 3.8 09/27/2012 0714   CL 110 09/27/2012 0714   CO2 21 09/27/2012 0714   GLUCOSE 87 09/27/2012 0714   BUN 24* 09/27/2012 0714   CREATININE 1.33 09/27/2012 0714   CREATININE 0.87 06/04/2012 0500   CALCIUM 8.9 09/27/2012 0714   PROT 6.3 08/01/2012 1018   ALBUMIN 3.6 08/01/2012 1018   AST 18 08/01/2012 1018   ALT 13 08/01/2012 1018   ALKPHOS 55 08/01/2012 1018   BILITOT 0.4 08/01/2012 1018   GFRNONAA >90 06/04/2012 0500   GFRAA >90 06/04/2012 0500   Lab Results  Component Value Date   CHOL 120 05/23/2012   TRIG 154* 03/05/2012   Lab Results  Component Value Date   HGBA1C 5.0 04/26/2012   Lab Results  Component Value Date   VITAMINB12 416 04/28/2012   Lab Results  Component Value Date   TSH 2.661 04/30/2012    NCS (NERVE CONDUCTION STUDY) WITH EMG (ELECTROMYOGRAPHY)  08/10/2012 This is an abnormal study. There is electrodiagnostic evidence of mild axonal right radial neuropathy, probably proximal to the takeoff to right triceps muscle. There is also evidence of mild right median neuropathy across the wrist, consistent with a mild right carpal tunnel syndrome. There is no electrodiagnostic evidence of right cervical radiculopathy, no evidence of right ulnar neuropathy.   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  h/o ulcerative colitis and diverticulitis s/p ileostomy.  Has made great recovery.  PLAN:  Continue aspirin 81 mg for stroke prevention as atrial fibrillation was likley a solitary event related to sepsis and his CHAD2VASc score does not justify anticoagulation especially as he has low platelets and abnormal LFTs.   Continue therapies. Follow up in 6 months with Larita Fife, NP  Tawny Asal LAM, MSN, NP-C 10/30/2012, 4:35 PM [facility] [address] [place], [place] [address] [phone]

## 2012-10-30 NOTE — Patient Instructions (Signed)
Continue aspirin 81 mg orally every day  for secondary stroke prevention and maintain strict control of hypertension with blood pressure goal below 130/90, diabetes with hemoglobin A1c goal below 6.5% and lipids with LDL cholesterol goal below 100 mg/dL.   Followup in the future with me in 6 months.

## 2012-11-02 ENCOUNTER — Ambulatory Visit (HOSPITAL_COMMUNITY): Payer: BC Managed Care – PPO | Admitting: Specialist

## 2012-11-02 ENCOUNTER — Telehealth (INDEPENDENT_AMBULATORY_CARE_PROVIDER_SITE_OTHER): Payer: Self-pay

## 2012-11-02 NOTE — Telephone Encounter (Signed)
Fax medical records request for Colostomy Takedown surgery by Dr. Payton Doughty @ [facility].  Fax confirmation rec'd and attached to outgoing fax.

## 2012-11-05 ENCOUNTER — Ambulatory Visit (HOSPITAL_COMMUNITY): Payer: BC Managed Care – PPO | Admitting: Specialist

## 2012-11-07 ENCOUNTER — Ambulatory Visit (HOSPITAL_COMMUNITY): Payer: BC Managed Care – PPO | Admitting: Specialist

## 2012-11-09 ENCOUNTER — Ambulatory Visit (HOSPITAL_COMMUNITY): Payer: BC Managed Care – PPO | Admitting: Specialist

## 2012-11-19 ENCOUNTER — Ambulatory Visit (HOSPITAL_COMMUNITY): Payer: BC Managed Care – PPO | Admitting: Specialist

## 2012-11-21 ENCOUNTER — Ambulatory Visit (HOSPITAL_COMMUNITY): Payer: BC Managed Care – PPO | Admitting: Specialist

## 2012-11-23 ENCOUNTER — Ambulatory Visit (HOSPITAL_COMMUNITY): Payer: BC Managed Care – PPO | Admitting: Specialist

## 2012-11-29 ENCOUNTER — Other Ambulatory Visit: Payer: Self-pay

## 2012-12-24 ENCOUNTER — Telehealth (INDEPENDENT_AMBULATORY_CARE_PROVIDER_SITE_OTHER): Payer: Self-pay | Admitting: *Deleted

## 2012-12-24 NOTE — Telephone Encounter (Signed)
Voice mail left from [place] at orthopedic center  They need his latest labs done they think in October 2014.  Fax to [phone]

## 2012-12-25 NOTE — Telephone Encounter (Signed)
Lab results faxed.

## 2012-12-28 ENCOUNTER — Ambulatory Visit (INDEPENDENT_AMBULATORY_CARE_PROVIDER_SITE_OTHER): Payer: BC Managed Care – PPO | Admitting: General Surgery

## 2012-12-28 ENCOUNTER — Encounter (INDEPENDENT_AMBULATORY_CARE_PROVIDER_SITE_OTHER): Payer: Self-pay | Admitting: General Surgery

## 2012-12-28 VITALS — BP 102/70 | HR 60 | Temp 100.4°F | Resp 14 | Ht 69.0 in | Wt 153.2 lb

## 2012-12-28 DIAGNOSIS — Z932 Ileostomy status: Secondary | ICD-10-CM

## 2012-12-28 NOTE — Progress Notes (Signed)
Chief complaint: Followup ileostomy  History: Patient returns for more long-term followup now approaching 1 year following total abdominal colectomy and ileostomy for severe C. Difficile colitis her postoperative course complicated by wound dehiscence and respiratory and renal failure. He continues to steadily improve. He has gained more weight. He and his wife say that he is about 85 or 90% packed with full-strength. He is anticipating contracture release surgery for his right hand next week. He denies any abdominal or GI complaints or trouble with his ileostomy.  Exam: BP 102/70  Pulse 60  Temp(Src) 100.4 F (38 C) (Temporal)  Resp 14  Ht 5' 9"  (1.753 m)  Wt 153 lb 3.2 oz (69.491 kg)  BMI 22.61 kg/m2 General: Thin but well appearing Caucasian male Lungs: Clear equal breath sounds Abdomen: Well-healed midline incision without hernias. Ileostomy healthy. Soft and nontender.  Assessment and plan: Continue steady improvement after a severe illness in total, colectomy as above. He wants his ileostomy reversed which is reasonable. I would like to have a flex sigmoidoscopy of his rectum to rule out any residual significant colitis or other problems prior to takedown. We will ask Dr. Laural Golden to see him for that. I will see him back in 2 months and we will talk about scheduling his surgery at that time. We discussed the surgery in general terms including its nature and recovery and potential difficulties with frequent and loose bowel movements.

## 2012-12-28 NOTE — Patient Instructions (Signed)
Dr. Laural Golden should contact you for sigmoidoscopy

## 2012-12-31 ENCOUNTER — Telehealth (INDEPENDENT_AMBULATORY_CARE_PROVIDER_SITE_OTHER): Payer: Self-pay | Admitting: *Deleted

## 2012-12-31 ENCOUNTER — Encounter (INDEPENDENT_AMBULATORY_CARE_PROVIDER_SITE_OTHER): Payer: Self-pay | Admitting: *Deleted

## 2012-12-31 ENCOUNTER — Other Ambulatory Visit (INDEPENDENT_AMBULATORY_CARE_PROVIDER_SITE_OTHER): Payer: Self-pay | Admitting: *Deleted

## 2012-12-31 DIAGNOSIS — K5190 Ulcerative colitis, unspecified, without complications: Secondary | ICD-10-CM

## 2012-12-31 NOTE — Telephone Encounter (Signed)
I spoke with pts wife regarding an appt for a flex sigmoidoscopy with Dr. Laural Golden at [facility] day surgery scheduled for 02/06/13 with an arrival time of 12:00pm.  I informed pts wife Connor Small that [name] from Dr. Olevia Perches office will be sending a packet out with prep instructions for this procedure.  Pts wife verbalized understanding and agreed with this appt.

## 2013-01-03 ENCOUNTER — Telehealth: Payer: Self-pay

## 2013-01-03 NOTE — Telephone Encounter (Signed)
I don't recall what type of work he does? If he's doing sedentary work, I probably could just release him. If not, and there is some physical component, then perhaps he should see his pcp.

## 2013-01-03 NOTE — Telephone Encounter (Signed)
Patient's wife called for him and said employer was going to fax over a form for you to sign so he can be released to return to work. Does patient need a FU visit? If so you do not have any available appointments until the end of January and they need the form by the middle of January.

## 2013-01-04 NOTE — Telephone Encounter (Signed)
Spoke with Patients wife, insurance will fax job description.  They have an appointment and will discuss further then.

## 2013-01-28 ENCOUNTER — Ambulatory Visit (INDEPENDENT_AMBULATORY_CARE_PROVIDER_SITE_OTHER): Payer: BC Managed Care – PPO | Admitting: Internal Medicine

## 2013-01-28 ENCOUNTER — Encounter (INDEPENDENT_AMBULATORY_CARE_PROVIDER_SITE_OTHER): Payer: Self-pay | Admitting: Internal Medicine

## 2013-01-28 VITALS — BP 102/68 | HR 66 | Temp 98.2°F | Resp 18 | Ht 70.0 in | Wt 155.6 lb

## 2013-01-28 DIAGNOSIS — Z9889 Other specified postprocedural states: Secondary | ICD-10-CM

## 2013-01-28 DIAGNOSIS — D649 Anemia, unspecified: Secondary | ICD-10-CM

## 2013-01-28 DIAGNOSIS — Z8719 Personal history of other diseases of the digestive system: Secondary | ICD-10-CM

## 2013-01-28 DIAGNOSIS — Z932 Ileostomy status: Secondary | ICD-10-CM

## 2013-01-28 NOTE — Progress Notes (Signed)
Presenting complaint;  History of ulcerative colitis. Patient interested in having ileostomy reversed. History of anemia.  Database;  Connor Small is 53 year old Caucasian male who is well known to me from previous evaluations. UC was diagnosed in 2004. He has remained in remission. In October 2012 he presented with colonic perforation and underwent surgery at [facility]. He had colostomy and resection of sigmoid colon. Cause of perforation could never be determined. He had colonoscopy by me in January 2013 and he was noted to be in remission. In February last year he had takedown of his colostomy. Patient was doing great when he was seen in the office on 01/16/2012.  He developed fulminant C. difficile colitis following course of clindamycin given by his dentist for tooth abscess. 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 He did not feel better with cecostomy and a day later had subtotal colectomy with ileostomy.  He he was transferred to [facility] and was in ICU for several weeks. 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 He also developed myopathy and neuropathy. According to his wife he was transferred to rehabilitation on 05/18/2012 and was discharged on 06/06/2012. He had multiple other problems including ascites and pleural effusion as well as thrombocytopenia and anemia.  He was last seen in July 2014 4 hematuria abdominal pain and nausea and found to have a right hydronephrosis secondary to urolithiasis. He was evaluated by urologist. He has passed 2 stones spontaneously. Last month he had surgery on his right hand for contracture release and has noted more movement and use to his hand. He has been driving for over 3 months.  Subjective:  Patient is accompanied by his wife Estill Bamberg. He continues to feel well. He has gained another 15 pounds in the last 4 months. His weight was 200 pounds before he got  sick. He is hoping to get upto 180 pounds. He was seen by Dr. Excell Seltzer one month ago. He is planning to have his ileostomy reversed and is scheduled for sigmoidoscopy next week. He has good appetite. He denies nausea vomiting heartburn abdominal pain. He has not noted any change in output of ileostomy and he denies bleeding or melena and ileostomy. He has noted scant rectal discharge which is clear or cloudy but not hemorrhagic.  Current Medications: Current Outpatient Prescriptions  Medication Sig Dispense Refill  . acetaminophen (TYLENOL) 325 MG tablet Take 650 mg by mouth every 6 (six) hours as needed.      Marland Kitchen aspirin 81 MG chewable tablet Chew 81 mg by mouth daily.      . Multiple Vitamin (MULTIVITAMIN) LIQD Take 5 mLs by mouth daily.       No current facility-administered medications for this visit.     Objective: Blood pressure 102/68, pulse 66, temperature 98.2 F (36.8 C), temperature source Oral, resp. rate 18, height 5' 10"  (1.778 m), weight 155 lb 9.6 oz (70.58 kg). Patient is alert and in no acute distress. Conjunctiva is pink. Sclera is nonicteric Oropharyngeal mucosa is normal. No neck masses or thyromegaly noted. Cardiac exam with regular rhythm normal S1 and S2. No murmur or gallop noted. Lungs are clear to auscultation. Abdomen. Ileostomy located in right lower quadrant. There is a pasty yellow stool in ileostomy bag. Well-healed midline scar along with scar at LLQ from prior colostomy. Abdomen is soft and nontender without organomegaly or masses.  No LE edema or clubbing noted. He has limited flexion at MCP, PIP and  DIPs right hand.  Labs/studies Results: Lab from 09/27/2012. WBC 7.4, H&H 11.4 and 34.3 and platelet count 300K. Serum sodium 140, potassium 3.8, carotid 110, CO2 21, BUN 24, creatinine 1.33 and glucose 87. Serum calcium 8.9.    Assessment:  #1. History of ileostomy. 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 Patient interested in having ileostomy reversed. He will need endoscopic evaluation of rectum to rule out proctitis or other abnormalities. #2. History of anemia. H&H 4 months ago was 11.4 and 34.3. I would expect for his H&H to be normal or near normal by now. He will have CBC at the time of preop blood work possibly in 8 weeks. #3. History of ulcerative colitis. Since initial diagnosis in 2004 he has remained in remission. He has been off oral mesalamine since subtotal colectomy in July 2014. He will need to go back on oral mesalamine then ileostomy reversed.    Plan:  Flexible sigmoidoscopy on 02/06/2013. Office visit in 6 months.

## 2013-01-28 NOTE — Patient Instructions (Signed)
Flexible sigmoidoscopy next week as planned.

## 2013-01-29 ENCOUNTER — Encounter (HOSPITAL_COMMUNITY): Payer: Self-pay | Admitting: Pharmacy Technician

## 2013-02-06 ENCOUNTER — Encounter (HOSPITAL_COMMUNITY): Payer: Self-pay | Admitting: *Deleted

## 2013-02-06 ENCOUNTER — Encounter (HOSPITAL_COMMUNITY)
Admission: RE | Disposition: A | Discharge status: Home / Self Care | Payer: Self-pay | Source: Ambulatory Visit | Attending: Internal Medicine

## 2013-02-06 ENCOUNTER — Ambulatory Visit (HOSPITAL_COMMUNITY)
Admission: RE | Admit: 2013-02-06 | Discharge: 2013-02-06 | Disposition: A | Discharge status: Home / Self Care | Payer: BC Managed Care – PPO | Source: Ambulatory Visit | Attending: Internal Medicine | Admitting: Internal Medicine

## 2013-02-06 DIAGNOSIS — K5289 Other specified noninfective gastroenteritis and colitis: Secondary | ICD-10-CM | POA: Insufficient documentation

## 2013-02-06 DIAGNOSIS — K5190 Ulcerative colitis, unspecified, without complications: Secondary | ICD-10-CM

## 2013-02-06 DIAGNOSIS — Z9049 Acquired absence of other specified parts of digestive tract: Secondary | ICD-10-CM | POA: Insufficient documentation

## 2013-02-06 DIAGNOSIS — Z09 Encounter for follow-up examination after completed treatment for conditions other than malignant neoplasm: Secondary | ICD-10-CM

## 2013-02-06 DIAGNOSIS — Z932 Ileostomy status: Secondary | ICD-10-CM | POA: Insufficient documentation

## 2013-02-06 DIAGNOSIS — A0472 Enterocolitis due to Clostridium difficile, not specified as recurrent: Secondary | ICD-10-CM | POA: Insufficient documentation

## 2013-02-06 HISTORY — PX: FLEXIBLE SIGMOIDOSCOPY: SHX5431

## 2013-02-06 SURGERY — SIGMOIDOSCOPY, FLEXIBLE
Anesthesia: Moderate Sedation

## 2013-02-06 MED ORDER — MEPERIDINE HCL 50 MG/ML IJ SOLN
INTRAMUSCULAR | Status: AC
  Filled 2013-02-06: qty 1

## 2013-02-06 MED ORDER — STERILE WATER FOR IRRIGATION IR SOLN
Status: DC | PRN
  Administered 2013-02-06: 12:00:00

## 2013-02-06 MED ORDER — MIDAZOLAM HCL 5 MG/5ML IJ SOLN
INTRAMUSCULAR | Status: DC | PRN
  Administered 2013-02-06 (×2): 2 mg via INTRAVENOUS

## 2013-02-06 MED ORDER — SODIUM CHLORIDE 0.9 % IV SOLN
INTRAVENOUS | Status: DC
  Administered 2013-02-06: 1000 mL via INTRAVENOUS

## 2013-02-06 MED ORDER — MESALAMINE 1000 MG RE SUPP: 1000.0000 mg | Freq: Every day | RECTAL | Status: DC

## 2013-02-06 MED ORDER — MEPERIDINE HCL 25 MG/ML IJ SOLN
INTRAMUSCULAR | Status: DC | PRN
  Administered 2013-02-06: 20 mg via INTRAVENOUS

## 2013-02-06 MED ORDER — MIDAZOLAM HCL 5 MG/5ML IJ SOLN
INTRAMUSCULAR | Status: AC
  Filled 2013-02-06: qty 10

## 2013-02-06 NOTE — Op Note (Signed)
FLEXIBLE SIGMOIDOSCOPY PROCEDURE REPORT  PATIENT:  Connor Small  MR#:  [id number] Birthdate:  [birth year], 53 y.o., male Endoscopist:  Dr. Rogene Houston, MD Referred By:  Dr. Excell Seltzer, MD Procedure Date: 02/06/2013  Procedure:   Flexible sigmoidoscopy/proctoscopy.  Indications:  Patient is 53 year old Caucasian male with history of ulcerative colitis. In February last year he presented with fulminant C. difficile colitis made worse with high grade colonic anastomotic stricture leading to subtotal colectomy and ileostomy. Patient is interested in having ileostomy reconnected to his rectum. Dr. Excell Seltzer requested evaluation of rectal mucosa prior to reconstructive surgery.  Informed Consent:  The procedure and risks were reviewed with the patient and informed consent was obtained.  Medications:  Demerol 20 mg IV Versed 4 mg IV  Description of procedure:   Procedure performed endoscopy suite. Patient was placed in left lateral recumbent position and rectal examination performed. No abnormality noted on external a digital exam. Pediatric Pentax via colonoscope was placed in the rectum and findings noted. Biopsy was taken and then the scope was withdrawn. I did not attempt retroflexion.  Findings:   Rectal pouch is about 12 cm long with friable mucosa with multiple punctate erosions. Metallic clip noted at the blunt end. Biopsy was taken from rectal mucosa for routine histology.   Complications:  None   Impression:  Small rectal pouch with diffuse proctitis(friable mucosa with multiple erosions). 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 Biopsy taken for routine histology.  Recommendations:  Standard instructions given. Canasa suppository 1 g per rectum each bedtime. I will contact patient with biopsy results and further recommendations. If patient does not respond to topical mesalamine will need short chain fatty acid enemas for a few weeks.  REHMAN,NAJEEB U  02/06/2013 11:51  AM  CC: Dr. Chesley Noon, MD & Dr. Rayne Du ref. provider found  CC: Dr. Edward Jolly, MD

## 2013-02-06 NOTE — Discharge Instructions (Signed)
Resume usual medications and diet. Canasa or mesalamine suppository 1 g per rectum daily at that time. No driving for 24 hours. Physician will contact you with biopsy results.  Colonoscopy, Care After Refer to this sheet in the next few weeks. These instructions provide you with information on caring for yourself after your procedure. Your health care provider may also give you more specific instructions. Your treatment has been planned according to current medical practices, but problems sometimes occur. Call your health care provider if you have any problems or questions after your procedure. WHAT TO EXPECT AFTER THE PROCEDURE  After your procedure, it is typical to have the following:  A small amount of blood in your stool.  Moderate amounts of gas and mild abdominal cramping or bloating. HOME CARE INSTRUCTIONS  Do not drive, operate machinery, or sign important documents for 24 hours.  You may shower and resume your regular physical activities, but move at a slower pace for the first 24 hours.  Take frequent rest periods for the first 24 hours.  Walk around or put a warm pack on your abdomen to help reduce abdominal cramping and bloating.  Drink enough fluids to keep your urine clear or pale yellow.  You may resume your normal diet as instructed by your health care provider. Avoid heavy or fried foods that are hard to digest.  Avoid drinking alcohol for 24 hours or as instructed by your health care provider.  Only take over-the-counter or prescription medicines as directed by your health care provider.  If a tissue sample (biopsy) was taken during your procedure:  Do not take aspirin or blood thinners for 7 days, or as instructed by your health care provider.  Do not drink alcohol for 7 days, or as instructed by your health care provider.  Eat soft foods for the first 24 hours. SEEK MEDICAL CARE IF: You have persistent spotting of blood in your stool 2 3 days after the  procedure. SEEK IMMEDIATE MEDICAL CARE IF:  You have more than a small spotting of blood in your stool.  You pass large blood clots in your stool.  Your abdomen is swollen (distended).  You have nausea or vomiting.  You have a fever.  You have increasing abdominal pain that is not relieved with medicine.

## 2013-02-06 NOTE — Telephone Encounter (Signed)
Encounter is complete

## 2013-02-06 NOTE — H&P (Signed)
Connor Small is an 53 y.o. male.   Chief Complaint: Patient is here for flexible sigmoidoscopy. HPI: Patient is 53 year old Caucasian male who has history of ulcerative colitis and has remained in remission. He presented in October 2012 sigmoid colon perforation and underwent sigmoid resection and colostomy which was subsequently reversed. He presented in February last year with fulminant C. difficile colitis. This condition was felt to be secondary to stricture at anastomosis. His course did not reverse with cecostomy and then he went on to have subtotal colectomy with ileostomy. He required hospitalization for several days. He has improved greatly. He is interested in having ileostomy reversed. Dr. Excell Seltzer requested examination of rectosigmoid area to make sure UC is in remission.  Past Medical History  Diagnosis Date  . Chronic diarrhea   . Rectal bleed   . Hemorrhoids   . Ulcerative colitis     Distal UC over 8 yrs ago diagnosed  . Diverticulitis of large intestine with perforation 10/2011    done at [facility]  . 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 history of UC (ulcerative colitis) 12/06/2010    Past Surgical History  Procedure Laterality Date  . Colonoscopy  9/03  . Sigmoidoscopy       06/13/2002  . Temporary ostomy November 06, 2010      for a colon perforation that was done in [facility] (Dr Payton Doughty).    . Colonoscopy  02/16/2011    Procedure: COLONOSCOPY;  Surgeon: Rogene Houston, MD;  [facility];  Service: Endoscopy;  Laterality: N/A;  100  . Flexible sigmoidoscopy  02/27/2012    Procedure: FLEXIBLE SIGMOIDOSCOPY;  Surgeon: Rogene Houston, MD;  [facility];  Service: Endoscopy;  Laterality: N/A;  with colonic decompression  . Laparotomy  02/27/2012    Procedure: EXPLORATORY LAPAROTOMY;  Surgeon: Donato Heinz, MD;  Location: [facility];  Service: General;  Laterality: N/A;  . Cecostomy  02/27/2012    Procedure: CECOSTOMY;  Surgeon: Donato Heinz, MD;  Location:  [facility];  Service: General;  Laterality: N/A;  Cecostomy Tube Placement  . Colectomy  02/28/2012    Procedure: TOTAL COLECTOMY;  Surgeon: Donato Heinz, MD;  Location: [facility];  Service: General;  Laterality: N/A;  . Ileostomy  02/28/2012    Procedure: ILEOSTOMY;  Surgeon: Donato Heinz, MD;  Location: [facility];  Service: General;  Laterality: N/A;  . Cystoscopy w/ ureteral stent placement Bilateral 03/20/2012    Procedure: CYSTOSCOPY WITH RETROGRADE PYELOGRAM/URETERAL STENT PLACEMENT;  Surgeon: Alexis Frock, MD;  Location: [place];  Service: Urology;  Laterality: Bilateral;  . Laparotomy N/A 03/26/2012    Procedure: EXPLORATORY LAPAROTOMY;  Surgeon: Edward Jolly, MD;  Location: [facility];  Service: General;  Laterality: N/A;  . Application of wound vac N/A 03/26/2012    Procedure: APPLICATION OF WOUND VAC;  Surgeon: Edward Jolly, MD;  [facility];  Service: General;  Laterality: N/A;  . Liver biopsy N/A 03/26/2012    Procedure: LIVER BIOPSY;  Surgeon: Edward Jolly, MD;  Location: [place];  Service: General;  Laterality: N/A;  . Laparotomy N/A 03/29/2012    Procedure: EXPLORATORY LAPAROTOMY, PARTIAL WOUND CLOSURE;  Surgeon: Edward Jolly, MD;  Location: [facility];  Service: General;  Laterality: N/A;  . Vacuum assisted closure change N/A 03/29/2012    Procedure: Open ABDOMINAL VACUUM  CHANGE;  Surgeon: Edward Jolly, MD;  Location: [place];  Service: General;  Laterality: N/A;  . Vacuum  assisted closure change N/A 04/01/2012    Procedure: removal of abdominal vac dressing and abdominal closure;  Surgeon: Edward Jolly, MD;  [facility];  Service: General;  Laterality: N/A;    Family History  Problem Relation Age of Onset  . Cancer Sister   . Healthy Daughter   . Hypertension Mother    Social History:  reports that he quit smoking about 28 years ago. His smoking use included Cigarettes. He has a 6 pack-year smoking history. He has never used smokeless tobacco. He reports  that he does not drink alcohol or use illicit drugs.  Allergies:  Allergies  Allergen Reactions  . Penicillins Other (See Comments)    Heart rate changes  . Claritin [Loratadine]     Rash  . Morphine And Related     Nausea/vomiting  . Oxycodone     Nausea/vomiting  . Imipenem Rash    Medications Prior to Admission  Medication Sig Dispense Refill  . aspirin 81 MG chewable tablet Chew 81 mg by mouth daily.      . Multiple Vitamin (MULTIVITAMIN) LIQD Take 5 mLs by mouth daily.      Marland Kitchen saccharomyces boulardii (FLORASTOR) 250 MG capsule Take 250 mg by mouth 2 (two) times daily.      Marland Kitchen acetaminophen (TYLENOL) 325 MG tablet Take 650 mg by mouth every 6 (six) hours as needed.        No results found for this or any previous visit (from the past 48 hour(s)). No results found.  ROS  Blood pressure 118/83, pulse 67, temperature 98.1 F (36.7 C), temperature source Oral, resp. rate 15, SpO2 99.00%. Physical Exam  Constitutional: He appears well-developed and well-nourished.  HENT:  Mouth/Throat: Oropharynx is clear and moist.  Eyes: Pupils are equal, round, and reactive to light. No scleral icterus.  Neck: No thyromegaly present.  Cardiovascular: Normal rate, regular rhythm and normal heart sounds.   No murmur heard. Respiratory: Effort normal and breath sounds normal.  GI:  Ileostomy is in the right lower quadrant. Well-healed midline and left horizontal scar. Abdomen is soft and nontender without organomegaly or masses.  Musculoskeletal: He exhibits no edema.  He has limited movement in all MCPs PIPs and DIPs in all fingers of right hand  Lymphadenopathy:    He has no cervical adenopathy.  Neurological: He is alert.  Skin: Skin is warm and dry.     Assessment/Plan History of ulcerative colitis. Status post subtotal colectomy with ileostomy for fulminant C. differential colitis. Flexible sigmoidoscopy to assess help of a rectosigmoid mucosa prior to ileostomy  takedown.  Connor Small U 02/06/2013, 11:30 AM

## 2013-02-07 ENCOUNTER — Encounter (HOSPITAL_COMMUNITY): Payer: Self-pay | Admitting: Internal Medicine

## 2013-02-13 ENCOUNTER — Encounter: Payer: Self-pay | Admitting: Physical Medicine & Rehabilitation

## 2013-02-13 ENCOUNTER — Encounter
Payer: BC Managed Care – PPO | Attending: Physical Medicine & Rehabilitation | Admitting: Physical Medicine & Rehabilitation

## 2013-02-13 VITALS — BP 110/67 | HR 65 | Resp 14 | Ht 69.0 in | Wt 157.0 lb

## 2013-02-13 DIAGNOSIS — F411 Generalized anxiety disorder: Secondary | ICD-10-CM | POA: Insufficient documentation

## 2013-02-13 DIAGNOSIS — M24549 Contracture, unspecified hand: Secondary | ICD-10-CM

## 2013-02-13 DIAGNOSIS — S5420XA Injury of radial nerve at forearm level, unspecified arm, initial encounter: Secondary | ICD-10-CM

## 2013-02-13 DIAGNOSIS — S5410XA Injury of median nerve at forearm level, unspecified arm, initial encounter: Secondary | ICD-10-CM | POA: Insufficient documentation

## 2013-02-13 DIAGNOSIS — R5381 Other malaise: Secondary | ICD-10-CM | POA: Insufficient documentation

## 2013-02-13 DIAGNOSIS — M24541 Contracture, right hand: Secondary | ICD-10-CM

## 2013-02-13 DIAGNOSIS — S43006A Unspecified dislocation of unspecified shoulder joint, initial encounter: Secondary | ICD-10-CM

## 2013-02-13 DIAGNOSIS — E46 Unspecified protein-calorie malnutrition: Secondary | ICD-10-CM | POA: Insufficient documentation

## 2013-02-13 DIAGNOSIS — S43001A Unspecified subluxation of right shoulder joint, initial encounter: Secondary | ICD-10-CM

## 2013-02-13 DIAGNOSIS — M6281 Muscle weakness (generalized): Secondary | ICD-10-CM

## 2013-02-13 DIAGNOSIS — X58XXXA Exposure to other specified factors, initial encounter: Secondary | ICD-10-CM | POA: Insufficient documentation

## 2013-02-13 DIAGNOSIS — R29818 Other symptoms and signs involving the nervous system: Secondary | ICD-10-CM

## 2013-02-13 DIAGNOSIS — R29898 Other symptoms and signs involving the musculoskeletal system: Secondary | ICD-10-CM

## 2013-02-13 DIAGNOSIS — Z8673 Personal history of transient ischemic attack (TIA), and cerebral infarction without residual deficits: Secondary | ICD-10-CM | POA: Insufficient documentation

## 2013-02-13 DIAGNOSIS — G7281 Critical illness myopathy: Secondary | ICD-10-CM | POA: Insufficient documentation

## 2013-02-13 NOTE — Patient Instructions (Signed)
PLEASE CALL ME WITH ANY PROBLEMS OR QUESTIONS (#[phone]).      

## 2013-02-13 NOTE — Progress Notes (Signed)
Subjective:    Patient ID: Connor Small, male    DOB: 1960-07-27, 53 y.o.   MRN: [id number]  HPI  Connor Small is back regarding his multiple issues. He has continued to improve. He had hand surgery in December by Dr. Mina Marble and has had great results. 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   He reports no pain or aches. His physical stamina is back to normal. He may have a ostomy takedown later this year.   Pain Inventory Average Pain 0 Pain Right Now 0 My pain is no pain  In the last 24 hours, has pain interfered with the following? General activity 0 Relation with others 0 Enjoyment of life 0 What TIME of day is your pain at its worst? no pain Sleep (in general) Good  Pain is worse with: no pain Pain improves with: no pain Relief from Meds: no pain meds  Mobility walk without assistance how many minutes can you walk? 1 hour or more ability to climb steps?  yes do you drive?  yes Do you have any goals in this area?  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  yes  Neuro/Psych No problems in this area  Prior Studies Any changes since last visit?  no  Physicians involved in your care Any changes since last visit?  no   Family History  Problem Relation Age of Onset  . Cancer Sister   . Healthy Daughter   . Hypertension Mother    History   Social History  . Marital Status: Married    Spouse Name: [name]    Number of Children: 1  . 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 Smoking status: Former Smoker -- 0.50 packs/day for 12 years    Types: Cigarettes    Quit date: 07/17/1984  . Smokeless tobacco: Never Used     Comment: Quit 23 yrs ago  . Alcohol Use: No  . Drug Use: No  . Sexual Activity: Yes   Other Topics Concern  . None   Social History Narrative   Patient lives at home with his spouse.   Caffeine Use:  rarely   Past Surgical History  Procedure Laterality Date  . Colonoscopy  9/03  . Sigmoidoscopy       06/13/2002  . Temporary ostomy November 06, 2010      for a colon perforation that was done in [facility] (Dr Ruben Im).    . Colonoscopy  02/16/2011    Procedure: COLONOSCOPY;  Surgeon: Malissa Hippo, MD;  [facility];  Service: Endoscopy;  Laterality: N/A;  100  . Flexible sigmoidoscopy  02/27/2012    Procedure: FLEXIBLE SIGMOIDOSCOPY;  Surgeon: Malissa Hippo, MD;  [facility];  Service: Endoscopy;  Laterality: N/A;  with colonic decompression  . Laparotomy  02/27/2012    Procedure: EXPLORATORY LAPAROTOMY;  Surgeon: Fabio Bering, MD;  [facility];  Service: General;  Laterality: N/A;  . Cecostomy  02/27/2012    Procedure: CECOSTOMY;  Surgeon: Fabio Bering, MD;  [facility];  Service: General;  Laterality: N/A;  Cecostomy Tube Placement  . Colectomy  02/28/2012    Procedure: TOTAL COLECTOMY;  Surgeon: Fabio Bering, MD;  [facility];  Service: General;  Laterality: N/A;  . Ileostomy  02/28/2012  Procedure: ILEOSTOMY;  Surgeon: Fabio Bering, MD;  [facility];  Service: General;  Laterality: N/A;  . Cystoscopy w/ ureteral stent placement Bilateral 03/20/2012    Procedure: CYSTOSCOPY WITH RETROGRADE PYELOGRAM/URETERAL STENT PLACEMENT;  Surgeon: Sebastian Ache, MD;  Location: [facility];  Service: Urology;  Laterality: Bilateral;  . Laparotomy N/A 03/26/2012    Procedure: EXPLORATORY LAPAROTOMY;  Surgeon: Mariella Saa, MD;  [facility];  Service: General;  Laterality: N/A;  . Application of wound vac N/A 03/26/2012    Procedure: APPLICATION OF WOUND VAC;  Surgeon: Mariella Saa, MD;  [facility];  Service: General;  Laterality: N/A;  . Liver biopsy N/A 03/26/2012    Procedure: LIVER BIOPSY;  Surgeon: Mariella Saa, MD;  [facility];  Service: General;  Laterality: N/A;  . Laparotomy N/A 03/29/2012    Procedure: EXPLORATORY  LAPAROTOMY, PARTIAL WOUND CLOSURE;  Surgeon: Mariella Saa, MD;  [facility];  Service: General;  Laterality: N/A;  . Vacuum assisted closure change N/A 03/29/2012    Procedure: Open ABDOMINAL VACUUM  CHANGE;  Surgeon: Mariella Saa, MD;  [facility];  Service: General;  Laterality: N/A;  . Vacuum assisted closure change N/A 04/01/2012    Procedure: removal of abdominal vac dressing and abdominal closure;  Surgeon: Mariella Saa, MD;  Location: [facility];  Service: General;  Laterality: N/A;  . Flexible sigmoidoscopy N/A 02/06/2013    Procedure: FLEXIBLE SIGMOIDOSCOPY;  Surgeon: Malissa Hippo, MD;  [facility];  Service: Endoscopy;  Laterality: N/A;  100-moved to 1200 Ann notified pt   Past Medical History  Diagnosis Date  . Chronic diarrhea   . Rectal bleed   . Hemorrhoids   . Ulcerative colitis     Distal UC over 8 yrs ago diagnosed  . Diverticulitis of large intestine with perforation 10/2011    done at [facility]  . 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 history of UC (ulcerative colitis) 12/06/2010   BP 110/67  Pulse 65  Resp 14  Ht 5\' 9"  (1.753 m)  Wt 157 lb (71.215 kg)  BMI 23.17 kg/m2  SpO2 100%      Review of Systems  All other systems reviewed and are negative.       Objective:   Physical Exam  General: Alert and oriented x 3, No apparent distress. He has put on weight.  HEENT: Head is normocephalic, atraumatic, PERRLA, EOMI, sclera anicteric, oral mucosa pink and moist, dentition intact, ext ear canals clear,  Neck: Supple without JVD or lymphadenopathy  Heart: Reg rate and rhythm. No murmurs rubs or gallops  Chest: CTA bilaterally without wheezes, rales, or rhonchi; no distress  Abdomen: Soft, non-tender, non-distended, bowel sounds positive. Ostomy site intact.  Extremities: No clubbing, cyanosis, or edema. Pulses are 2+  Skin: Clean and intact without signs of breakdown  Neuro: Pt is cognitively appropriate with normal insight, memory,  and awareness. Cranial nerves 2-12 are intact. Sensory exam is normal. Reflexes are 1+ on the left, 2+ on the right. Fine motor coordination is intact. No tremors. Motor function is grossly 3+ to 4/5 LE with tight hamstrings noted. RUE is 3+/5 deltoid, 3+ to 4 BICEPS, TRICEPS, WE is 2+ to 3/5.  Musculoskeletal: balance good. Right hand and finger ROM much improved. He can almost make a fist and his grip is much improved.Marland Kitchen Psych: Pt's affect is appropriate. Pt is cooperative. Non anxious  Assessment & Plan:   1. Critical illness myopathy/severe  deconditioning  2. 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 Anxiety  4. 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 Left pleuaral effusion  6. Protein malnutrition    Plan:  1. I gave Connor Small permission to return to his job. He should be able to return without limitations. 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  2. He may see me back prn.   I am very pleased for Connor Small and his wife!

## 2013-02-14 ENCOUNTER — Encounter (INDEPENDENT_AMBULATORY_CARE_PROVIDER_SITE_OTHER): Payer: Self-pay | Admitting: *Deleted

## 2013-02-25 ENCOUNTER — Encounter (INDEPENDENT_AMBULATORY_CARE_PROVIDER_SITE_OTHER): Payer: Self-pay | Admitting: Internal Medicine

## 2013-03-06 ENCOUNTER — Encounter (INDEPENDENT_AMBULATORY_CARE_PROVIDER_SITE_OTHER): Payer: Self-pay | Admitting: General Surgery

## 2013-03-06 ENCOUNTER — Ambulatory Visit (INDEPENDENT_AMBULATORY_CARE_PROVIDER_SITE_OTHER): Payer: BC Managed Care – PPO | Admitting: General Surgery

## 2013-03-06 VITALS — BP 128/84 | HR 70 | Temp 98.0°F | Resp 18 | Ht 69.0 in | Wt 155.0 lb

## 2013-03-06 DIAGNOSIS — Z932 Ileostomy status: Secondary | ICD-10-CM

## 2013-03-06 DIAGNOSIS — K6289 Other specified diseases of anus and rectum: Secondary | ICD-10-CM

## 2013-03-06 NOTE — Patient Instructions (Signed)
Please contact Dr. Laural Golden to schedule a followup sigmoidoscopy. If results show improvement we will go ahead and work on scheduling surgery.

## 2013-03-06 NOTE — Progress Notes (Signed)
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 He had a long postoperative course with respiratory failure and renal failure and wound dehiscence. He however is made a continued steady recovery. 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 Not having any abdominal complaints. He recently had a flexible sigmoidoscopy about one month ago showing fairly severe diversion proctitis. He has been on Canasa suppositories and notices decreased rectal mucus. He otherwise has no pain. Appetite is good. He had contracture release on his right hand and this is much improved.  Past Medical History  Diagnosis Date  . Chronic diarrhea   . Rectal bleed   . Hemorrhoids   . Ulcerative colitis     Distal UC over 8 yrs ago diagnosed  . Diverticulitis of large intestine with perforation 10/2011    done at [place]  . 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 history of UC (ulcerative colitis) 12/06/2010   Past Surgical History  Procedure Laterality Date  . Colonoscopy  9/03  . Sigmoidoscopy       06/13/2002  . Temporary ostomy November 06, 2010      for a colon perforation that was done in [facility] (Dr Payton Doughty).    . Colonoscopy  02/16/2011    Procedure: COLONOSCOPY;  Surgeon: Rogene Houston, MD;  [facility];  Service: Endoscopy;  Laterality: N/A;  100  . Flexible sigmoidoscopy  02/27/2012    Procedure: FLEXIBLE SIGMOIDOSCOPY;  Surgeon: Rogene Houston, MD;  [facility];  Service: Endoscopy;  Laterality: N/A;  with colonic decompression  . Laparotomy  02/27/2012    Procedure: EXPLORATORY LAPAROTOMY;  Surgeon: Donato Heinz, MD;  [facility];  Service: General;  Laterality: N/A;  . Cecostomy  02/27/2012    Procedure: CECOSTOMY;  Surgeon: Donato Heinz, MD;  [facility];  Service: General;  Laterality:  N/A;  Cecostomy Tube Placement  . Colectomy  02/28/2012    Procedure: TOTAL COLECTOMY;  Surgeon: Donato Heinz, MD;  [facility];  Service: General;  Laterality: N/A;  . Ileostomy  02/28/2012    Procedure: ILEOSTOMY;  Surgeon: Donato Heinz, MD;  [facility];  Service: General;  Laterality: N/A;  . Cystoscopy w/ ureteral stent placement Bilateral 03/20/2012    Procedure: CYSTOSCOPY WITH RETROGRADE PYELOGRAM/URETERAL STENT PLACEMENT;  Surgeon: Alexis Frock, MD;  Location: [place];  Service: Urology;  Laterality: Bilateral;  . Laparotomy N/A 03/26/2012    Procedure: EXPLORATORY LAPAROTOMY;  Surgeon: Edward Jolly, MD;  Location: [place];  Service: General;  Laterality: N/A;  . Application of wound vac N/A 03/26/2012    Procedure: APPLICATION OF WOUND VAC;  Surgeon: Edward Jolly, MD;  [facility];  Service: General;  Laterality: N/A;  . Liver biopsy N/A 03/26/2012    Procedure: LIVER BIOPSY;  Surgeon: Edward Jolly, MD;  Location: [place];  Service: General;  Laterality: N/A;  . Laparotomy N/A 03/29/2012    Procedure: EXPLORATORY LAPAROTOMY, PARTIAL WOUND CLOSURE;  Surgeon: Edward Jolly, MD;  Location: [place];  Service: General;  Laterality: N/A;  . Vacuum assisted closure change N/A 03/29/2012    Procedure: Open ABDOMINAL VACUUM  CHANGE;  Surgeon: Edward Jolly, MD;  Location: [place];  Service: General;  Laterality: N/A;  . Vacuum assisted closure change N/A 04/01/2012  Procedure: removal of abdominal vac dressing and abdominal closure;  Surgeon: Edward Jolly, MD;  Location: [place];  Service: General;  Laterality: N/A;  . Flexible sigmoidoscopy N/A 02/06/2013    Procedure: FLEXIBLE SIGMOIDOSCOPY;  Surgeon: Rogene Houston, MD;  [facility];  Service: Endoscopy;  Laterality: N/A;  100-moved to 1200 Ann notified pt   Current Outpatient Prescriptions  Medication Sig Dispense Refill  . acetaminophen (TYLENOL) 325 MG tablet Take 650 mg by mouth every 6  (six) hours as needed.      Marland Kitchen aspirin 81 MG chewable tablet Chew 81 mg by mouth daily.      . fluticasone (FLONASE) 50 MCG/ACT nasal spray       . mesalamine (CANASA) 1000 MG suppository Place 1 suppository (1,000 mg total) rectally at bedtime.  30 suppository  3  . Multiple Vitamin (MULTIVITAMIN) LIQD Take 5 mLs by mouth daily.      Marland Kitchen saccharomyces boulardii (FLORASTOR) 250 MG capsule Take 250 mg by mouth 2 (two) times daily.       No current facility-administered medications for this visit.   Allergies  Allergen Reactions  . Penicillins Other (See Comments)    Heart rate changes  . Claritin [Loratadine]     Rash  . Clindamycin/Lincomycin Diarrhea    C-diff  . Morphine And Related     Nausea/vomiting  . Imipenem Rash   Exam: BP 128/84  Pulse 70  Temp(Src) 98 F (36.7 C)  Resp 18  Ht 5' 9"  (1.753 m)  Wt 155 lb (70.308 kg)  BMI 22.88 kg/m2 General: Somewhat thin but healthy-appearing and stronger than he is looked at since of note in Skin: No rash infection Lungs: Clear breath sounds bilaterally Cardiac: Regular rate and rhythm without murmurs. No edema Abdomen: Long healed midline incision. Ileostomy right lower abdomen. He has a healed colostomy site, all in the left lower quadrant from a colon perforation years ago of uncertain etiology. Extremities: Still some contractures of the right hand but improved Neurologic: Alert and oriented. Gait normal.  Assessment and plan: Status post ileostomy as above. He strongly desires reversal. I believe he would be an appropriate candidate. I would like to document that his proctitis is improved and I asked them to contact Dr. Laural Golden for a followup sigmoidoscopy. I would not expect this to be resolved but hopefully to see some improvement. If so I believe we can go ahead and work on scheduling takedown of ileostomy. We discussed the nature of the surgery and expected recovery period discussed risks of anesthetic complications, bleeding,  infection, anastomotic leak and possible need to create another ostomy if he had complications. All her questions were answered. Await sigmoidoscopy.

## 2013-03-07 ENCOUNTER — Encounter (INDEPENDENT_AMBULATORY_CARE_PROVIDER_SITE_OTHER): Payer: Self-pay | Admitting: Internal Medicine

## 2013-03-18 ENCOUNTER — Encounter (INDEPENDENT_AMBULATORY_CARE_PROVIDER_SITE_OTHER): Payer: Self-pay | Admitting: Internal Medicine

## 2013-03-18 ENCOUNTER — Other Ambulatory Visit (INDEPENDENT_AMBULATORY_CARE_PROVIDER_SITE_OTHER): Payer: Self-pay | Admitting: *Deleted

## 2013-03-18 DIAGNOSIS — K6289 Other specified diseases of anus and rectum: Secondary | ICD-10-CM

## 2013-03-18 DIAGNOSIS — K5190 Ulcerative colitis, unspecified, without complications: Secondary | ICD-10-CM

## 2013-03-20 ENCOUNTER — Ambulatory Visit: Payer: BC Managed Care – PPO | Admitting: Physical Medicine & Rehabilitation

## 2013-03-22 ENCOUNTER — Encounter (HOSPITAL_COMMUNITY): Payer: Self-pay | Admitting: Pharmacy Technician

## 2013-03-27 ENCOUNTER — Ambulatory Visit (HOSPITAL_COMMUNITY)
Admission: RE | Admit: 2013-03-27 | Discharge: 2013-03-27 | Disposition: A | Discharge status: Home / Self Care | Payer: BC Managed Care – PPO | Source: Ambulatory Visit | Attending: Internal Medicine | Admitting: Internal Medicine

## 2013-03-27 ENCOUNTER — Encounter (HOSPITAL_COMMUNITY): Payer: Self-pay

## 2013-03-27 ENCOUNTER — Encounter (HOSPITAL_COMMUNITY)
Admission: RE | Disposition: A | Discharge status: Home / Self Care | Payer: Self-pay | Source: Ambulatory Visit | Attending: Internal Medicine

## 2013-03-27 DIAGNOSIS — Z933 Colostomy status: Secondary | ICD-10-CM | POA: Insufficient documentation

## 2013-03-27 DIAGNOSIS — Z09 Encounter for follow-up examination after completed treatment for conditions other than malignant neoplasm: Secondary | ICD-10-CM

## 2013-03-27 DIAGNOSIS — Z9049 Acquired absence of other specified parts of digestive tract: Secondary | ICD-10-CM | POA: Insufficient documentation

## 2013-03-27 DIAGNOSIS — K6289 Other specified diseases of anus and rectum: Secondary | ICD-10-CM

## 2013-03-27 DIAGNOSIS — Z7982 Long term (current) use of aspirin: Secondary | ICD-10-CM | POA: Insufficient documentation

## 2013-03-27 DIAGNOSIS — K5190 Ulcerative colitis, unspecified, without complications: Secondary | ICD-10-CM

## 2013-03-27 HISTORY — PX: FLEXIBLE SIGMOIDOSCOPY: SHX5431

## 2013-03-27 SURGERY — SIGMOIDOSCOPY, FLEXIBLE
Anesthesia: Moderate Sedation

## 2013-03-27 MED ORDER — SODIUM CHLORIDE 0.9 % IV SOLN: INTRAVENOUS | Status: DC

## 2013-03-27 NOTE — Discharge Instructions (Signed)
Resume usual medications and diet. Further recommendations per Dr. Adonis Housekeeper.   Flexible Sigmoidoscopy Your caregiver has ordered a flexible sigmoidoscopy. This is an exam to evaluate your lower colon. In this exam your colon is cleansed and a short fiber optic tube is inserted through your rectum and into your colon. The fiber optic scope (endoscope) is a short bundle of enclosed flexible small glass fibers. It transmits light to the area examined and images from that area to your caregiver. You do not have to worry about glass breakage in the endoscope. Discomfort is usually minimal. Sedatives and pain medications are generally not required. This exam helps to detect tumors (lumps), polyps, inflammation (swelling and soreness), and areas of bleeding. It may also be used to take biopsies. These are small pieces of tissue taken to examine under a microscope. LET YOUR CAREGIVER KNOW ABOUT:  Allergies.  Medications taken including herbs, eye drops, over the counter medications, and creams.  Use of steroids (by mouth or creams).  Previous problems with anesthetics or novocaine  Possibility of pregnancy, if this applies.  History of blood clots (thrombophlebitis).  History of bleeding or blood problems.  Previous surgery.  Other health problems. BEFORE THE PROCEDURE Eat normally the night before the exam. Your caregiver may order a mild enema or laxative the night before. No eating or drinking should occur after midnight until the procedure is completed. A rectal suppository or enemas may be given in the morning prior to your procedure. You will be brought to the examination area in a hospital gown. You should be present 60 minutes prior to your procedure or as directed.  AFTER THE PROCEDURE   There is sometimes a little blood passed with the first bowel movement. Do not be concerned. Because air is often used during the exam, it is not unusual to pass gas and experience abdominal  (belly) cramping. Walking or a warm pack on your abdomen may help with this. Do not sleep with a heating pad as burns can occur.  You may resume all normal eating and activities.  Only take over-the-counter or prescription medicines for pain, discomfort, or fever as directed by your caregiver. Do not use aspirin or blood thinners if a biopsy (tissue sample) was taken. Consult your caregiver for medication usage if biopsies were taken.  Call for your results as instructed by your caregiver. Remember, it is your responsibility to obtain the results of your biopsy. Do not assume everything is fine because you do not hear from your caregiver. SEEK IMMEDIATE MEDICAL CARE IF:  An oral temperature above 102 F (38.9 C) develops.  You pass large blood clots or fill a toilet with blood following the procedure. This may also occur 10 to 14 days following the procedure. It is more likely if a biopsy was taken.  You develop abdominal pain not relieved with medication or that is getting worse rather than better. Document Released: 01/08/2000 Document Revised: 04/04/2011 Document Reviewed: 10/20/2004 [facility] Patient Information 2014 [place].

## 2013-03-27 NOTE — H&P (Signed)
Connor Small is an 53 y.o. male.   Chief Complaint: Patient is here for proctoscopy/flexible sigmoidoscopy. 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 He had multiple complications has, and is considering takedown of ileostomy. 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 He has been treated with mesalamine suppositories. He was intolerant of short chain fatty acid enemas. He is undergoing repeat exam to document healing of diversion colitis for surgery scheduled.  Past Medical History  Diagnosis Date  . Chronic diarrhea   . Rectal bleed   . Hemorrhoids   . Ulcerative colitis     Distal UC over 8 yrs ago diagnosed  . Diverticulitis of large intestine with perforation 10/2011    done at [place]  . 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 history of UC (ulcerative colitis) 12/06/2010    Past Surgical History  Procedure Laterality Date  . Colonoscopy  9/03  . Sigmoidoscopy       06/13/2002  . Temporary ostomy November 06, 2010      for a colon perforation that was done in [facility] (Dr Payton Doughty).    . Colonoscopy  02/16/2011    Procedure: COLONOSCOPY;  Surgeon: Rogene Houston, MD;  [facility];  Service: Endoscopy;  Laterality: N/A;  100  . Flexible sigmoidoscopy  02/27/2012    Procedure: FLEXIBLE SIGMOIDOSCOPY;  Surgeon: Rogene Houston, MD;  [facility];  Service: Endoscopy;  Laterality: N/A;  with colonic decompression  . Laparotomy  02/27/2012    Procedure: EXPLORATORY LAPAROTOMY;  Surgeon: Donato Heinz, MD;  [facility];  Service: General;  Laterality: N/A;  . Cecostomy  02/27/2012    Procedure: CECOSTOMY;  Surgeon: Donato Heinz, MD;  [facility];  Service: General;  Laterality: N/A;  Cecostomy Tube Placement  . Colectomy  02/28/2012     Procedure: TOTAL COLECTOMY;  Surgeon: Donato Heinz, MD;  [facility];  Service: General;  Laterality: N/A;  . Ileostomy  02/28/2012    Procedure: ILEOSTOMY;  Surgeon: Donato Heinz, MD;  [facility];  Service: General;  Laterality: N/A;  . Cystoscopy w/ ureteral stent placement Bilateral 03/20/2012    Procedure: CYSTOSCOPY WITH RETROGRADE PYELOGRAM/URETERAL STENT PLACEMENT;  Surgeon: Alexis Frock, MD;  Location: [place];  Service: Urology;  Laterality: Bilateral;  . Laparotomy N/A 03/26/2012    Procedure: EXPLORATORY LAPAROTOMY;  Surgeon: Edward Jolly, MD;  Location: [place];  Service: General;  Laterality: N/A;  . Application of wound vac N/A 03/26/2012    Procedure: APPLICATION OF WOUND VAC;  Surgeon: Edward Jolly, MD;  [facility];  Service: General;  Laterality: N/A;  . Liver biopsy N/A 03/26/2012    Procedure: LIVER BIOPSY;  Surgeon: Edward Jolly, MD;  Location: [place];  Service: General;  Laterality: N/A;  . Laparotomy N/A 03/29/2012    Procedure: EXPLORATORY LAPAROTOMY, PARTIAL WOUND CLOSURE;  Surgeon: Edward Jolly, MD;  Location: [place];  Service: General;  Laterality: N/A;  . Vacuum assisted closure change N/A 03/29/2012    Procedure: Open ABDOMINAL VACUUM  CHANGE;  Surgeon: Edward Jolly, MD;  Location: [place];  Service: General;  Laterality: N/A;  . Vacuum assisted closure change N/A 04/01/2012    Procedure: removal of abdominal vac dressing and abdominal closure;  Surgeon: Marland Kitchen T  Hoxworth, MD;  Location: [place];  Service: General;  Laterality: N/A;  . Flexible sigmoidoscopy N/A 02/06/2013    Procedure: FLEXIBLE SIGMOIDOSCOPY;  Surgeon: Rogene Houston, MD;  [facility];  Service: Endoscopy;  Laterality: N/A;  100-moved to 1200 Ann notified pt    Family History  Problem Relation Age of Onset  . Cancer Sister   . Healthy Daughter   . Hypertension Mother    Social History:  reports that he quit smoking about 28 years ago. His smoking  use included Cigarettes. He has a 6 pack-year smoking history. He has never used smokeless tobacco. He reports that he does not drink alcohol or use illicit drugs.  Allergies:  Allergies  Allergen Reactions  . Penicillins Other (See Comments)    Heart rate changes  . Claritin [Loratadine]     Rash  . Clindamycin/Lincomycin Diarrhea    C-diff  . Morphine And Related     Nausea/vomiting  . Imipenem Rash    Medications Prior to Admission  Medication Sig Dispense Refill  . acetaminophen (TYLENOL) 325 MG tablet Take 650 mg by mouth every 6 (six) hours as needed.      Marland Kitchen aspirin 81 MG chewable tablet Chew 81 mg by mouth daily.      . fluticasone (FLONASE) 50 MCG/ACT nasal spray       . mesalamine (CANASA) 1000 MG suppository Place 1 suppository (1,000 mg total) rectally at bedtime.  30 suppository  3  . Multiple Vitamin (MULTIVITAMIN) LIQD Take 5 mLs by mouth daily.      Marland Kitchen saccharomyces boulardii (FLORASTOR) 250 MG capsule Take 250 mg by mouth 2 (two) times daily.        No results found for this or any previous visit (from the past 48 hour(s)). No results found.  ROS  Blood pressure 102/61, pulse 78, temperature 98.3 F (36.8 C), temperature source Oral, resp. rate 22, SpO2 99.00%. Physical Exam  Constitutional: He appears well-developed and well-nourished.  HENT:  Mouth/Throat: Oropharynx is clear and moist.  Eyes: Conjunctivae are normal. No scleral icterus.  Neck: No thyromegaly present.  GI:  Flat abdomen with lower midline scar and ileostomy in right lower quadrant. Abdomen is soft and nontender without organomegaly or masses.  Musculoskeletal: He exhibits no edema.  Limited grip to right hand  Lymphadenopathy:    He has no cervical adenopathy.  Neurological: He is alert.  Skin: Skin is warm and dry.     Assessment/Plan History of diversion proctitis. Diagnostic proctoscopy/flexible sigmoidoscopy.  REHMAN,NAJEEB U 03/27/2013, 2:12 PM

## 2013-03-27 NOTE — Op Note (Signed)
PATIENT:  Connor Small                 MR#:  [id number] Birthdate:  07/08/1960, 53 y.o., male Endoscopist:  Dr. Malissa Hippo, MD Referred By:  Dr. Glenna Fellows, MD Procedure Date: 03/27/2013   Procedure:   Flexible sigmoidoscopy/proctoscopy.   Indications:  Patient is 53 year old Caucasian male with history of ulcerative colitis. In February last year he presented with fulminant C. difficile colitis made worse with high grade colonic anastomotic stricture leading to subtotal colectomy and ileostomy. Patient underwent flexible sigmoidoscopy on 02/06/2013 and found to have a diversion colitis. He was intolerant of short chain fatty acid enemas and has been using Canasa suppositories. He is undergoing reevaluation to make sure proctitis has healed prior to considering takedown of ileostomy. Patient has scant amount of clear rectal discharge. He denies rectal bleeding or pain.   Informed Consent:  The procedure and risks were reviewed with the patient and informed consent was obtained.   Medications:   None.  Description of procedure:   Procedure performed in endoscopy suite. Patient was placed in left lateral recumbent position and rectal examination performed. No abnormality noted on external a digital exam. Ultraslim Pentax via colonoscope was placed in the rectum and findings noted.  Retroflexion was not attempted.   Findings:   Rectal pouch is about 12 cm long with friable mucosa with scattered punctate erosions. Metallic clip noted at the blunt end. No biopsy was taken.   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 Rectal mucosa is not as inflamed and friable as on last exam of 02/06/2013   Recommendations:   Standard instructions given. Continue Canasa suppository 1 g per rectum daily at bedtime. Will discuss findings with Dr. Jaclynn Guarneri.  Cordell Coke U  03/27/2013  2:30 PM   CC: Dr.  Eartha Inch, MD & Dr. Bonnetta Barry ref. provider found  CC: Dr. Mariella Saa, MD

## 2013-04-01 ENCOUNTER — Encounter (HOSPITAL_COMMUNITY): Payer: Self-pay | Admitting: Internal Medicine

## 2013-04-03 ENCOUNTER — Encounter (INDEPENDENT_AMBULATORY_CARE_PROVIDER_SITE_OTHER): Payer: Self-pay | Admitting: General Surgery

## 2013-04-05 ENCOUNTER — Other Ambulatory Visit (INDEPENDENT_AMBULATORY_CARE_PROVIDER_SITE_OTHER): Payer: Self-pay | Admitting: General Surgery

## 2013-05-01 ENCOUNTER — Ambulatory Visit: Payer: BC Managed Care – PPO | Admitting: Nurse Practitioner

## 2013-05-15 ENCOUNTER — Encounter (HOSPITAL_COMMUNITY): Payer: Self-pay | Admitting: Pharmacy Technician

## 2013-05-17 ENCOUNTER — Ambulatory Visit (INDEPENDENT_AMBULATORY_CARE_PROVIDER_SITE_OTHER): Payer: BC Managed Care – PPO | Admitting: General Surgery

## 2013-05-17 ENCOUNTER — Encounter (INDEPENDENT_AMBULATORY_CARE_PROVIDER_SITE_OTHER): Payer: Self-pay | Admitting: General Surgery

## 2013-05-17 VITALS — BP 122/74 | HR 75 | Temp 97.3°F | Resp 14 | Ht 68.0 in | Wt 161.2 lb

## 2013-05-17 DIAGNOSIS — Z932 Ileostomy status: Secondary | ICD-10-CM

## 2013-05-17 NOTE — Progress Notes (Signed)
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 He had a long postoperative course with respiratory failure and renal failure and wound dehiscence. He however is made a continued steady recovery. 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 Not having any abdominal complaints. He recently had a flexible sigmoidoscopy about one month ago showing fairly severe diversion proctitis. He has been on Canasa suppositories and notices decreased rectal mucus. He otherwise has no pain. Appetite is good. He had contracture release on his right hand and this is much improved.    Past Medical History  Diagnosis Date  . Chronic diarrhea   . Rectal bleed   . Hemorrhoids   . Ulcerative colitis     Distal UC over 8 yrs ago diagnosed  . Diverticulitis of large intestine with perforation 10/2011    done at [place]  . 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 history of UC (ulcerative colitis) 12/06/2010   Past Surgical History  Procedure Laterality Date  . Colonoscopy  9/03  . Sigmoidoscopy       06/13/2002  . Temporary ostomy November 06, 2010      for a colon perforation that was done in [facility] (Dr Payton Doughty).    . Colonoscopy  02/16/2011    Procedure: COLONOSCOPY;  Surgeon: Rogene Houston, MD;  [facility];  Service: Endoscopy;  Laterality: N/A;  100  . Flexible sigmoidoscopy  02/27/2012    Procedure: FLEXIBLE SIGMOIDOSCOPY;  Surgeon: Rogene Houston, MD;  [facility];  Service: Endoscopy;  Laterality: N/A;  with colonic decompression  . Laparotomy  02/27/2012    Procedure: EXPLORATORY LAPAROTOMY;  Surgeon: Donato Heinz, MD;  [facility];  Service: General;  Laterality: N/A;  . Cecostomy  02/27/2012    Procedure: CECOSTOMY;  Surgeon: Donato Heinz, MD;  [facility];  Service: General;   Laterality: N/A;  Cecostomy Tube Placement  . Colectomy  02/28/2012    Procedure: TOTAL COLECTOMY;  Surgeon: Donato Heinz, MD;  [facility];  Service: General;  Laterality: N/A;  . Ileostomy  02/28/2012    Procedure: ILEOSTOMY;  Surgeon: Donato Heinz, MD;  [facility];  Service: General;  Laterality: N/A;  . Cystoscopy w/ ureteral stent placement Bilateral 03/20/2012    Procedure: CYSTOSCOPY WITH RETROGRADE PYELOGRAM/URETERAL STENT PLACEMENT;  Surgeon: Alexis Frock, MD;  Location: [place];  Service: Urology;  Laterality: Bilateral;  . Laparotomy N/A 03/26/2012    Procedure: EXPLORATORY LAPAROTOMY;  Surgeon: Edward Jolly, MD;  Location: [place];  Service: General;  Laterality: N/A;  . Application of wound vac N/A 03/26/2012    Procedure: APPLICATION OF WOUND VAC;  Surgeon: Edward Jolly, MD;  [facility];  Service: General;  Laterality: N/A;  . Liver biopsy N/A 03/26/2012    Procedure: LIVER BIOPSY;  Surgeon: Edward Jolly, MD;  Location: [place];  Service: General;  Laterality: N/A;  . Laparotomy N/A 03/29/2012    Procedure: EXPLORATORY LAPAROTOMY, PARTIAL WOUND CLOSURE;  Surgeon: Edward Jolly, MD;  Location: [place];  Service: General;  Laterality: N/A;  . Vacuum assisted closure change N/A 03/29/2012    Procedure: Open ABDOMINAL VACUUM  CHANGE;  Surgeon: Edward Jolly, MD;  Location: [place];  Service: General;  Laterality: N/A;  . Vacuum assisted closure change N/A  04/01/2012    Procedure: removal of abdominal vac dressing and abdominal closure;  Surgeon: Edward Jolly, MD;  Location: [place];  Service: General;  Laterality: N/A;  . Flexible sigmoidoscopy N/A 02/06/2013    Procedure: FLEXIBLE SIGMOIDOSCOPY;  Surgeon: Rogene Houston, MD;  [facility];  Service: Endoscopy;  Laterality: N/A;  100-moved to 1200 Ann notified pt  . Flexible sigmoidoscopy N/A 03/27/2013    Procedure: FLEXIBLE SIGMOIDOSCOPY;  Surgeon: Rogene Houston, MD;  [facility];  Service: Endoscopy;  Laterality: N/A;  225   Current Outpatient Prescriptions  Medication Sig Dispense Refill  . fluticasone (FLONASE) 50 MCG/ACT nasal spray Place 1 spray into both nostrils daily.       . mesalamine (CANASA) 1000 MG suppository Place 1 suppository (1,000 mg total) rectally at bedtime.  30 suppository  3  . Multiple Vitamin (MULTIVITAMIN) LIQD Take 5 mLs by mouth daily.      Marland Kitchen saccharomyces boulardii (FLORASTOR) 250 MG capsule Take 250 mg by mouth 2 (two) times daily.      Marland Kitchen aspirin 81 MG chewable tablet Chew 81 mg by mouth daily.       No current facility-administered medications for this visit.   Allergies  Allergen Reactions  . Penicillins Other (See Comments)    Heart rate changes  . Claritin [Loratadine]     Rash  . Clindamycin/Lincomycin Diarrhea    C-diff  . Morphine And Related     Nausea/vomiting  . Imipenem Rash   Exam: BP 122/74  Pulse 75  Temp(Src) 97.3 F (36.3 C)  Resp 14  Ht 5' 8"  (1.727 m)  Wt 161 lb 3.2 oz (73.12 kg)  BMI 24.52 kg/m2 General: well-appearing Caucasian male no distress Skin: No rash infection Lungs: Clear breath sounds bilaterally Cardiac: Regular rate and rhythm without murmurs. No edema Abdomen: Long healed midline incision. Ileostomy right lower abdomen. He has a healed colostomy site, all in the left lower quadrant from a colon perforation years ago of uncertain etiology. Rectal: No external abnormalities, normal tone, no masses or stricture or unusual tenderness or blood Extremities: Still some contractures of the right hand but improved Neurologic: Alert and oriented. Gait normal.  Assessment and plan: Status post ileostomy as above. He strongly desires reversal. I believe he would be an appropriate candidate. Since his last visit Dr. 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  I believe we can go ahead and work on  scheduling takedown of ileostomy. We discussed the nature of the surgery and expected recovery period discussed risks of anesthetic complications, bleeding, infection, anastomotic leak and possible need to create another ostomy if he had complications. All her questions were answered. Await sigmoidoscopy.

## 2013-05-21 ENCOUNTER — Encounter (HOSPITAL_COMMUNITY)
Admission: RE | Admit: 2013-05-21 | Discharge: 2013-05-21 | Disposition: A | Discharge status: Home / Self Care | Payer: BC Managed Care – PPO | Source: Ambulatory Visit | Attending: General Surgery | Admitting: General Surgery

## 2013-05-21 ENCOUNTER — Encounter (HOSPITAL_COMMUNITY): Payer: Self-pay

## 2013-05-21 ENCOUNTER — Telehealth (INDEPENDENT_AMBULATORY_CARE_PROVIDER_SITE_OTHER): Payer: Self-pay

## 2013-05-21 DIAGNOSIS — Z01812 Encounter for preprocedural laboratory examination: Secondary | ICD-10-CM | POA: Insufficient documentation

## 2013-05-21 LAB — URINE MICROSCOPIC-ADD ON

## 2013-05-21 LAB — CBC WITH DIFFERENTIAL/PLATELET
BASOS ABS: 0.1 10*3/uL (ref 0.0–0.1)
Basophils Relative: 1 % (ref 0–1)
EOS ABS: 0.3 10*3/uL (ref 0.0–0.7)
EOS PCT: 3 % (ref 0–5)
HEMATOCRIT: 35.5 % — AB (ref 39.0–52.0)
Hemoglobin: 11.8 g/dL — ABNORMAL LOW (ref 13.0–17.0)
Lymphocytes Relative: 16 % (ref 12–46)
Lymphs Abs: 1.4 10*3/uL (ref 0.7–4.0)
MCH: 30.6 pg (ref 26.0–34.0)
MCHC: 33.2 g/dL (ref 30.0–36.0)
MCV: 92 fL (ref 78.0–100.0)
MONO ABS: 0.9 10*3/uL (ref 0.1–1.0)
Monocytes Relative: 10 % (ref 3–12)
Neutro Abs: 6.4 10*3/uL (ref 1.7–7.7)
Neutrophils Relative %: 71 % (ref 43–77)
Platelets: 252 10*3/uL (ref 150–400)
RBC: 3.86 MIL/uL — ABNORMAL LOW (ref 4.22–5.81)
RDW: 14.1 % (ref 11.5–15.5)
WBC: 9 10*3/uL (ref 4.0–10.5)

## 2013-05-21 LAB — URINALYSIS, ROUTINE W REFLEX MICROSCOPIC
Bilirubin Urine: NEGATIVE
GLUCOSE, UA: NEGATIVE mg/dL
KETONES UR: NEGATIVE mg/dL
Leukocytes, UA: NEGATIVE
NITRITE: NEGATIVE
PROTEIN: 30 mg/dL — AB
Specific Gravity, Urine: 1.021 (ref 1.005–1.030)
UROBILINOGEN UA: 0.2 mg/dL (ref 0.0–1.0)
pH: 5 (ref 5.0–8.0)

## 2013-05-21 LAB — COMPREHENSIVE METABOLIC PANEL
ALT: 19 U/L (ref 0–53)
AST: 27 U/L (ref 0–37)
Albumin: 3.3 g/dL — ABNORMAL LOW (ref 3.5–5.2)
Alkaline Phosphatase: 101 U/L (ref 39–117)
BUN: 24 mg/dL — ABNORMAL HIGH (ref 6–23)
CALCIUM: 8.9 mg/dL (ref 8.4–10.5)
CO2: 24 meq/L (ref 19–32)
CREATININE: 1.34 mg/dL (ref 0.50–1.35)
Chloride: 106 mEq/L (ref 96–112)
GFR, EST AFRICAN AMERICAN: 67 mL/min — AB (ref >90)
GFR, EST NON AFRICAN AMERICAN: 58 mL/min — AB (ref >90)
GLUCOSE: 100 mg/dL — AB (ref 70–99)
Potassium: 4.5 mEq/L (ref 3.7–5.3)
Sodium: 140 mEq/L (ref 137–147)
TOTAL PROTEIN: 6.6 g/dL (ref 6.0–8.3)
Total Bilirubin: <0.2 mg/dL — ABNORMAL LOW (ref 0.3–1.2)

## 2013-05-21 LAB — PROTIME-INR
INR: 1.08 (ref 0.00–1.49)
Prothrombin Time: 13.8 seconds (ref 11.6–15.2)

## 2013-05-21 NOTE — Patient Instructions (Addendum)
20     Your procedure is scheduled on:  Monday 05/27/2013  Report to [facility] at  [place] AM.  Call this number if you have problems the night before or morning of surgery:   [phone]   Remember:             IF YOU USE CPAP,BRING MASK AND TUBING AM OF SURGERY!             IF YOU DO NOT HAVE YOUR TYPE AND SCREEN DRAWN AT PRE-ADMIT APPOINTMENT, YOU WILL HAVE IT DRAWN AM OF SURGERY!   Do not eat food or drink liquids AFTER MIDNIGHT!  Take these medicines the morning of surgery with A SIP OF WATER: use Flonase nasal spray    [facility] IS NOT RESPONSIBLE FOR ANY BELONGINGS OR VALUABLES BROUGHT TO HOSPITAL.  Marland Kitchen  Leave suitcase in the car. After surgery it may be brought to your room.  For patients admitted to the hospital, checkout time is 11:00 AM the day of              Discharge.    DO NOT WEAR JEWELRY,MAKE-UP,LOTIONS,POWDERS,PERFUMES,CONTACTS , DENTURES OR BRIDGEWORK ,AND DO NOT WEAR FALSE EYELASHES                                    Patients discharged the day of surgery will not be allowed to drive home.  If going home the same day of surgery, must have someone stay with you first 24 hrs.at home and arrange for someone to drive you home from the [place]:   Special Instructions:              Please read over the following fact sheets that you were given:             1. [place].Tobin Chad     [phone]                              [facility] - Preparing for Surgery Before surgery, you can play an important role.  Because skin is not sterile, your skin needs to be as free of germs as possible.  You can reduce the number of germs on your skin by washing with CHG (chlorahexidine gluconate) soap before surgery.  CHG is an antiseptic cleaner which kills germs and bonds with the skin to continue killing  germs even after washing. Please DO NOT use if you have an allergy to CHG or antibacterial soaps.  If your skin becomes reddened/irritated stop using the CHG and inform your nurse when you arrive at Short Stay. Do not shave (including legs and underarms) for at least 48 hours prior to the first CHG shower.  You may shave your face. Please follow these instructions carefully:  1.  Shower with CHG Soap the night before surgery and the  morning of Surgery.  2.  If you choose to wash your hair, wash your hair first as usual with your  normal  shampoo.  3.  After you shampoo, rinse your hair and body thoroughly to remove the  shampoo.                           4.  Use CHG as you would any other liquid soap.  You can apply chg directly  to the skin and wash                       Gently with a scrungie or clean washcloth.  5.  Apply the CHG Soap to your body ONLY FROM THE NECK DOWN.   Do not use on open                           Wound or open sores. Avoid contact with eyes, ears mouth and genitals (private parts).                        Genitals (private parts) with your normal soap.             6.  Wash thoroughly, paying special attention to the area where your surgery  will be performed.  7.  Thoroughly rinse your body with warm water from the neck down.  8.  DO NOT shower/wash with your normal soap after using and rinsing off  the CHG Soap.                9.  Pat yourself dry with a clean towel.            10.  Wear clean pajamas.            11.  Place clean sheets on your bed the night of your first shower and do not  sleep with pets. Day of Surgery : Do not apply any lotions/deodorants the morning of surgery.  Please wear clean clothes to the hospital/surgery center.  FAILURE TO FOLLOW THESE INSTRUCTIONS MAY RESULT IN THE CANCELLATION OF YOUR SURGERY PATIENT SIGNATURE_________________________________  NURSE  SIGNATURE__________________________________  ________________________________________________________________________   Connor Small  An incentive spirometer is a tool that can help keep your lungs clear and active. This tool measures how well you are filling your lungs with each breath. Taking long deep breaths may help reverse or decrease the chance of developing breathing (pulmonary) problems (especially infection) following:  A long period of time when you are unable to move or be active. BEFORE THE PROCEDURE   If the spirometer includes an indicator to show your best effort, your nurse or respiratory therapist will set it to a desired goal.  If possible, sit up straight or lean slightly forward. Try not to slouch.  Hold the incentive spirometer in an upright position. INSTRUCTIONS FOR USE  1. Sit on the edge of your bed if possible, or sit up as far as you can in bed or on a chair. 2. Hold the incentive spirometer in an upright position. 3. Breathe out normally. 4. Place the mouthpiece in your mouth and seal your lips tightly around it. 5. Breathe in slowly and as deeply as possible, raising the piston or the ball toward the top of the column. 6. Hold your breath for 3-5 seconds or for as long as possible. Allow the piston  or ball to fall to the bottom of the column. 7. Remove the mouthpiece from your mouth and breathe out normally. 8. Rest for a few seconds and repeat Steps 1 through 7 at least 10 times every 1-2 hours when you are awake. Take your time and take a few normal breaths between deep breaths. 9. The spirometer may include an indicator to show your best effort. Use the indicator as a goal to work toward during each repetition. 10. After each set of 10 deep breaths, practice coughing to be sure your lungs are clear. If you have an incision (the cut made at the time of surgery), support your incision when coughing by placing a pillow or rolled up towels firmly  against it. Once you are able to get out of bed, walk around indoors and cough well. You may stop using the incentive spirometer when instructed by your caregiver.  RISKS AND COMPLICATIONS  Take your time so you do not get dizzy or light-headed.  If you are in pain, you may need to take or ask for pain medication before doing incentive spirometry. It is harder to take a deep breath if you are having pain. AFTER USE  Rest and breathe slowly and easily.  It can be helpful to keep track of a log of your progress. Your caregiver can provide you with a simple table to help with this. If you are using the spirometer at home, follow these instructions: [place] IF:   You are having difficultly using the spirometer.  You have trouble using the spirometer as often as instructed.  Your pain medication is not giving enough relief while using the spirometer.  You develop fever of 100.5 F (38.1 C) or higher. SEEK IMMEDIATE MEDICAL CARE IF:   You cough up bloody sputum that had not been present before.  You develop fever of 102 F (38.9 C) or greater.  You develop worsening pain at or near the incision site. MAKE SURE YOU:   Understand these instructions.  Will watch your condition.  Will get help right away if you are not doing well or get worse. Document Released: 05/23/2006 Document Revised: 04/04/2011 Document Reviewed: 07/24/2006 [facility] Patient Information 2014 [name], [place].   ________________________________________________________________________

## 2013-05-21 NOTE — Telephone Encounter (Signed)
Pre op calling to verify if the patient will need any prep prior to ileostomy takedown.  Per Ivor Costa, CMA, no prep.

## 2013-05-26 MED ORDER — CLINDAMYCIN PHOSPHATE 900 MG/50ML IV SOLN
900.0000 mg | INTRAVENOUS | Status: AC
  Administered 2013-05-27: 900 mg via INTRAVENOUS

## 2013-05-26 MED ORDER — GENTAMICIN SULFATE 40 MG/ML IJ SOLN
5.0000 mg/kg | INTRAVENOUS | Status: AC
  Administered 2013-05-27: 350 mg via INTRAVENOUS
  Filled 2013-05-26 (×2): qty 8.75

## 2013-05-27 ENCOUNTER — Encounter (HOSPITAL_COMMUNITY): Payer: BC Managed Care – PPO | Admitting: Anesthesiology

## 2013-05-27 ENCOUNTER — Encounter (HOSPITAL_COMMUNITY)
Admission: RE | Disposition: A | Discharge status: Home / Self Care | Payer: Self-pay | Source: Ambulatory Visit | Attending: General Surgery

## 2013-05-27 ENCOUNTER — Inpatient Hospital Stay (HOSPITAL_COMMUNITY): Payer: BC Managed Care – PPO | Admitting: Anesthesiology

## 2013-05-27 ENCOUNTER — Encounter (HOSPITAL_COMMUNITY): Payer: Self-pay | Admitting: *Deleted

## 2013-05-27 ENCOUNTER — Inpatient Hospital Stay (HOSPITAL_COMMUNITY)
Admission: RE | Admit: 2013-05-27 | Discharge: 2013-06-01 | DRG: 331 | Disposition: A | Discharge status: Home / Self Care | Payer: BC Managed Care – PPO | Source: Ambulatory Visit | Attending: General Surgery | Admitting: General Surgery

## 2013-05-27 DIAGNOSIS — Z7982 Long term (current) use of aspirin: Secondary | ICD-10-CM

## 2013-05-27 DIAGNOSIS — Z79899 Other long term (current) drug therapy: Secondary | ICD-10-CM

## 2013-05-27 DIAGNOSIS — Z01812 Encounter for preprocedural laboratory examination: Secondary | ICD-10-CM

## 2013-05-27 DIAGNOSIS — Z432 Encounter for attention to ileostomy: Secondary | ICD-10-CM

## 2013-05-27 DIAGNOSIS — Z9049 Acquired absence of other specified parts of digestive tract: Secondary | ICD-10-CM

## 2013-05-27 DIAGNOSIS — K6289 Other specified diseases of anus and rectum: Secondary | ICD-10-CM | POA: Diagnosis present

## 2013-05-27 DIAGNOSIS — R110 Nausea: Secondary | ICD-10-CM | POA: Diagnosis not present

## 2013-05-27 DIAGNOSIS — Z880 Allergy status to penicillin: Secondary | ICD-10-CM

## 2013-05-27 DIAGNOSIS — Z888 Allergy status to other drugs, medicaments and biological substances status: Secondary | ICD-10-CM

## 2013-05-27 DIAGNOSIS — Z885 Allergy status to narcotic agent status: Secondary | ICD-10-CM

## 2013-05-27 DIAGNOSIS — Z932 Ileostomy status: Secondary | ICD-10-CM

## 2013-05-27 DIAGNOSIS — Z881 Allergy status to other antibiotic agents status: Secondary | ICD-10-CM

## 2013-05-27 HISTORY — PX: ILEOSTOMY CLOSURE: SHX1784

## 2013-05-27 SURGERY — CLOSURE, ILEOSTOMY
Anesthesia: General | Site: Abdomen

## 2013-05-27 MED ORDER — HYDROMORPHONE HCL PF 1 MG/ML IJ SOLN
INTRAMUSCULAR | Status: AC
  Filled 2013-05-27: qty 1

## 2013-05-27 MED ORDER — PROPOFOL 10 MG/ML IV BOLUS
INTRAVENOUS | Status: AC
  Filled 2013-05-27: qty 20

## 2013-05-27 MED ORDER — ROCURONIUM BROMIDE 100 MG/10ML IV SOLN
INTRAVENOUS | Status: DC | PRN
  Administered 2013-05-27: 40 mg via INTRAVENOUS
  Administered 2013-05-27 (×2): 5 mg via INTRAVENOUS
  Administered 2013-05-27: 10 mg via INTRAVENOUS

## 2013-05-27 MED ORDER — ALVIMOPAN 12 MG PO CAPS
12.0000 mg | ORAL_CAPSULE | Freq: Two times a day (BID) | ORAL | Status: DC
  Administered 2013-05-28 – 2013-05-29 (×3): 12 mg via ORAL
  Filled 2013-05-27 (×5): qty 1

## 2013-05-27 MED ORDER — NALOXONE HCL 0.4 MG/ML IJ SOLN: 0.4000 mg | INTRAMUSCULAR | Status: DC | PRN

## 2013-05-27 MED ORDER — ONDANSETRON HCL 4 MG/2ML IJ SOLN
4.0000 mg | Freq: Four times a day (QID) | INTRAMUSCULAR | Status: DC | PRN
Start: 2013-05-27 — End: 2013-05-30
  Filled 2013-05-27: qty 2

## 2013-05-27 MED ORDER — ONDANSETRON HCL 4 MG PO TABS: 4.0000 mg | ORAL_TABLET | Freq: Four times a day (QID) | ORAL | Status: DC | PRN

## 2013-05-27 MED ORDER — FENTANYL CITRATE 0.05 MG/ML IJ SOLN
INTRAMUSCULAR | Status: AC
  Filled 2013-05-27: qty 5

## 2013-05-27 MED ORDER — OXYCODONE HCL 5 MG/5ML PO SOLN
5.0000 mg | Freq: Once | ORAL | Status: DC | PRN
  Filled 2013-05-27: qty 5

## 2013-05-27 MED ORDER — OXYCODONE HCL 5 MG PO TABS
5.0000 mg | ORAL_TABLET | Freq: Once | ORAL | Status: DC | PRN
Start: 2013-05-27 — End: 2013-05-27

## 2013-05-27 MED ORDER — PEG 3350-KCL-NA BICARB-NACL 420 G PO SOLR: 4000.0000 mL | Freq: Once | ORAL | Status: DC

## 2013-05-27 MED ORDER — ONDANSETRON HCL 4 MG/2ML IJ SOLN
4.0000 mg | Freq: Four times a day (QID) | INTRAMUSCULAR | Status: DC | PRN
  Administered 2013-05-30: 4 mg via INTRAVENOUS

## 2013-05-27 MED ORDER — PROMETHAZINE HCL 25 MG/ML IJ SOLN: 6.2500 mg | INTRAMUSCULAR | Status: DC | PRN

## 2013-05-27 MED ORDER — HEPARIN SODIUM (PORCINE) 5000 UNIT/ML IJ SOLN
5000.0000 [IU] | Freq: Three times a day (TID) | INTRAMUSCULAR | Status: DC
  Administered 2013-05-27 – 2013-06-01 (×14): 5000 [IU] via SUBCUTANEOUS
  Filled 2013-05-27 (×17): qty 1

## 2013-05-27 MED ORDER — ALVIMOPAN 12 MG PO CAPS
12.0000 mg | ORAL_CAPSULE | Freq: Once | ORAL | Status: AC
  Administered 2013-05-27: 12 mg via ORAL
  Filled 2013-05-27: qty 1

## 2013-05-27 MED ORDER — GLYCOPYRROLATE 0.2 MG/ML IJ SOLN
INTRAMUSCULAR | Status: AC
  Filled 2013-05-27: qty 3

## 2013-05-27 MED ORDER — MEPERIDINE HCL 50 MG/ML IJ SOLN: 6.2500 mg | INTRAMUSCULAR | Status: DC | PRN

## 2013-05-27 MED ORDER — CLINDAMYCIN PHOSPHATE 900 MG/50ML IV SOLN
INTRAVENOUS | Status: AC
  Filled 2013-05-27: qty 50

## 2013-05-27 MED ORDER — 0.9 % SODIUM CHLORIDE (POUR BTL) OPTIME
TOPICAL | Status: DC | PRN
  Administered 2013-05-27: 1000 mL

## 2013-05-27 MED ORDER — HYDROMORPHONE 0.3 MG/ML IV SOLN
INTRAVENOUS | Status: DC
  Administered 2013-05-27: 15:00:00 via INTRAVENOUS
  Administered 2013-05-27: 0.6 mg via INTRAVENOUS
  Administered 2013-05-28: 1.8 mg via INTRAVENOUS
  Administered 2013-05-28: 0.3 mg via INTRAVENOUS
  Administered 2013-05-28: 10:00:00 via INTRAVENOUS
  Administered 2013-05-28: 1.5 mg via INTRAVENOUS
  Administered 2013-05-28 (×2): 1.8 mg via INTRAVENOUS
  Administered 2013-05-28: 2.4 mg via INTRAVENOUS
  Administered 2013-05-28: 1.2 mg via INTRAVENOUS
  Administered 2013-05-29: 1.14 mg via INTRAVENOUS
  Administered 2013-05-29: 1.2 mg via INTRAVENOUS
  Administered 2013-05-29: 10:00:00 via INTRAVENOUS
  Administered 2013-05-29 (×2): 0.9 mg via INTRAVENOUS
  Administered 2013-05-29: 0.6 mg via INTRAVENOUS
  Administered 2013-05-30: 0.8 mg via INTRAVENOUS
  Filled 2013-05-27 (×2): qty 25

## 2013-05-27 MED ORDER — MIDAZOLAM HCL 5 MG/5ML IJ SOLN
INTRAMUSCULAR | Status: DC | PRN
  Administered 2013-05-27: 2 mg via INTRAVENOUS

## 2013-05-27 MED ORDER — SODIUM CHLORIDE 0.9 % IJ SOLN: 9.0000 mL | INTRAMUSCULAR | Status: DC | PRN

## 2013-05-27 MED ORDER — GLYCOPYRROLATE 0.2 MG/ML IJ SOLN
INTRAMUSCULAR | Status: DC | PRN
  Administered 2013-05-27: 0.6 mg via INTRAVENOUS

## 2013-05-27 MED ORDER — ROCURONIUM BROMIDE 100 MG/10ML IV SOLN
INTRAVENOUS | Status: AC
  Filled 2013-05-27: qty 1

## 2013-05-27 MED ORDER — HYDROMORPHONE HCL PF 2 MG/ML IJ SOLN
INTRAMUSCULAR | Status: AC
  Filled 2013-05-27: qty 1

## 2013-05-27 MED ORDER — ERYTHROMYCIN BASE 250 MG PO TABS
1000.0000 mg | ORAL_TABLET | ORAL | Status: DC
  Filled 2013-05-27: qty 4

## 2013-05-27 MED ORDER — LIDOCAINE HCL (CARDIAC) 20 MG/ML IV SOLN
INTRAVENOUS | Status: AC
  Filled 2013-05-27: qty 5

## 2013-05-27 MED ORDER — KCL IN DEXTROSE-NACL 20-5-0.9 MEQ/L-%-% IV SOLN
INTRAVENOUS | Status: DC
  Administered 2013-05-27 – 2013-05-28 (×3): via INTRAVENOUS
  Administered 2013-05-28: 125 mL via INTRAVENOUS
  Administered 2013-05-28 – 2013-05-31 (×6): via INTRAVENOUS
  Filled 2013-05-27 (×12): qty 1000

## 2013-05-27 MED ORDER — MIDAZOLAM HCL 2 MG/2ML IJ SOLN
INTRAMUSCULAR | Status: AC
  Filled 2013-05-27: qty 2

## 2013-05-27 MED ORDER — ONDANSETRON HCL 4 MG/2ML IJ SOLN
INTRAMUSCULAR | Status: AC
  Filled 2013-05-27: qty 2

## 2013-05-27 MED ORDER — FLUTICASONE PROPIONATE 50 MCG/ACT NA SUSP
1.0000 | Freq: Every day | NASAL | Status: DC
  Administered 2013-05-29 – 2013-05-31 (×2): 1 via NASAL
  Filled 2013-05-27 (×2): qty 16

## 2013-05-27 MED ORDER — PHENYLEPHRINE HCL 10 MG/ML IJ SOLN
INTRAMUSCULAR | Status: DC | PRN
  Administered 2013-05-27: 80 ug via INTRAVENOUS

## 2013-05-27 MED ORDER — NEOSTIGMINE METHYLSULFATE 10 MG/10ML IV SOLN
INTRAVENOUS | Status: DC | PRN
  Administered 2013-05-27: 4 mg via INTRAVENOUS

## 2013-05-27 MED ORDER — HYDROMORPHONE 0.3 MG/ML IV SOLN
INTRAVENOUS | Status: AC
  Filled 2013-05-27: qty 25

## 2013-05-27 MED ORDER — FENTANYL CITRATE 0.05 MG/ML IJ SOLN
INTRAMUSCULAR | Status: DC | PRN
  Administered 2013-05-27: 50 ug via INTRAVENOUS
  Administered 2013-05-27: 100 ug via INTRAVENOUS
  Administered 2013-05-27 (×2): 50 ug via INTRAVENOUS

## 2013-05-27 MED ORDER — DIPHENHYDRAMINE HCL 50 MG/ML IJ SOLN: 12.5000 mg | Freq: Four times a day (QID) | INTRAMUSCULAR | Status: DC | PRN

## 2013-05-27 MED ORDER — HYDROMORPHONE HCL PF 1 MG/ML IJ SOLN
0.2500 mg | INTRAMUSCULAR | Status: DC | PRN
  Administered 2013-05-27 (×3): 0.5 mg via INTRAVENOUS

## 2013-05-27 MED ORDER — HYDROMORPHONE HCL PF 1 MG/ML IJ SOLN
INTRAMUSCULAR | Status: DC | PRN
  Administered 2013-05-27: 1 mg via INTRAVENOUS
  Administered 2013-05-27 (×2): 0.5 mg via INTRAVENOUS

## 2013-05-27 MED ORDER — LACTATED RINGERS IV SOLN
INTRAVENOUS | Status: DC | PRN
  Administered 2013-05-27 (×3): via INTRAVENOUS

## 2013-05-27 MED ORDER — NEOMYCIN SULFATE 500 MG PO TABS
1000.0000 mg | ORAL_TABLET | ORAL | Status: DC
  Filled 2013-05-27: qty 2

## 2013-05-27 MED ORDER — PROPOFOL 10 MG/ML IV BOLUS
INTRAVENOUS | Status: DC | PRN
  Administered 2013-05-27: 50 mg via INTRAVENOUS

## 2013-05-27 MED ORDER — DIPHENHYDRAMINE HCL 12.5 MG/5ML PO ELIX: 12.5000 mg | ORAL_SOLUTION | Freq: Four times a day (QID) | ORAL | Status: DC | PRN

## 2013-05-27 MED ORDER — ONDANSETRON HCL 4 MG/2ML IJ SOLN
INTRAMUSCULAR | Status: DC | PRN
  Administered 2013-05-27: 4 mg via INTRAVENOUS

## 2013-05-27 SURGICAL SUPPLY — 61 items
APPLICATOR COTTON TIP 6IN STRL (MISCELLANEOUS) IMPLANT
BLADE EXTENDED COATED 6.5IN (ELECTRODE) ×3 IMPLANT
BLADE HEX COATED 2.75 (ELECTRODE) ×3 IMPLANT
BLADE SURG SZ10 CARB STEEL (BLADE) ×3 IMPLANT
CANISTER SUCTION 2500CC (MISCELLANEOUS) IMPLANT
CLIP TI LARGE 6 (CLIP) IMPLANT
COVER MAYO STAND STRL (DRAPES) ×6 IMPLANT
DRAPE LAPAROSCOPIC ABDOMINAL (DRAPES) ×3 IMPLANT
DRAPE LG THREE QUARTER DISP (DRAPES) ×6 IMPLANT
DRAPE UTILITY XL STRL (DRAPES) ×6 IMPLANT
DRAPE WARM FLUID 44X44 (DRAPE) ×3 IMPLANT
DRSG OPSITE POSTOP 4X12 (GAUZE/BANDAGES/DRESSINGS) ×3 IMPLANT
DRSG OPSITE POSTOP 4X6 (GAUZE/BANDAGES/DRESSINGS) ×3 IMPLANT
DRSG PAD ABDOMINAL 8X10 ST (GAUZE/BANDAGES/DRESSINGS) IMPLANT
ELECT REM PT RETURN 9FT ADLT (ELECTROSURGICAL) ×6
ELECTRODE REM PT RTRN 9FT ADLT (ELECTROSURGICAL) ×2 IMPLANT
GLOVE BIOGEL PI IND STRL 7.0 (GLOVE) ×5 IMPLANT
GLOVE BIOGEL PI INDICATOR 7.0 (GLOVE) ×10
GLOVE ECLIPSE 7.0 STRL STRAW (GLOVE) ×18 IMPLANT
GLOVE ECLIPSE 8.0 STRL XLNG CF (GLOVE) IMPLANT
GLOVE INDICATOR 8.0 STRL GRN (GLOVE) IMPLANT
GLOVE SURG ORTHO 8.0 STRL STRW (GLOVE) ×9 IMPLANT
GLOVE SURG SS PI 7.5 STRL IVOR (GLOVE) ×12 IMPLANT
GOWN STRL REUS W/TWL LRG LVL3 (GOWN DISPOSABLE) IMPLANT
GOWN STRL REUS W/TWL XL LVL3 (GOWN DISPOSABLE) ×27 IMPLANT
HOLDER FOLEY CATH W/STRAP (MISCELLANEOUS) ×3 IMPLANT
KIT BASIN OR (CUSTOM PROCEDURE TRAY) ×6 IMPLANT
LEGGING LITHOTOMY PAIR STRL (DRAPES) ×3 IMPLANT
MANIFOLD NEPTUNE II (INSTRUMENTS) ×3 IMPLANT
NS IRRIG 1000ML POUR BTL (IV SOLUTION) ×9 IMPLANT
PACK GENERAL/GYN (CUSTOM PROCEDURE TRAY) ×3 IMPLANT
PENCIL BUTTON HOLSTER BLD 10FT (ELECTRODE) ×3 IMPLANT
RELOAD PROXIMATE 75MM BLUE (ENDOMECHANICALS) ×3 IMPLANT
SCALPEL HARMONIC ACE (MISCELLANEOUS) IMPLANT
SPONGE GAUZE 4X4 12PLY (GAUZE/BANDAGES/DRESSINGS) IMPLANT
STAPLER CIRC ILS CVD 25MM (STAPLE) ×3 IMPLANT
STAPLER PROXIMATE 75MM BLUE (STAPLE) ×3 IMPLANT
STAPLER VISISTAT 35W (STAPLE) ×3 IMPLANT
SUCTION POOLE TIP (SUCTIONS) ×3 IMPLANT
SUT CHROMIC 2 0 SH (SUTURE) IMPLANT
SUT NOV 1 T60/GS (SUTURE) IMPLANT
SUT NOVA 1 T20/GS 25DT (SUTURE) ×3 IMPLANT
SUT NOVA NAB DX-16 0-1 5-0 T12 (SUTURE) ×3 IMPLANT
SUT NOVA T20/GS 25 (SUTURE) IMPLANT
SUT PDS AB 0 CTX 60 (SUTURE) ×6 IMPLANT
SUT PDS AB 1 CTX 36 (SUTURE) ×6 IMPLANT
SUT PROLENE 2 0 KS (SUTURE) IMPLANT
SUT SILK 2 0 (SUTURE) ×2
SUT SILK 2 0 SH CR/8 (SUTURE) ×3 IMPLANT
SUT SILK 2 0SH CR/8 30 (SUTURE) IMPLANT
SUT SILK 2-0 18XBRD TIE 12 (SUTURE) ×1 IMPLANT
SUT SILK 2-0 30XBRD TIE 12 (SUTURE) IMPLANT
SUT SILK 3 0 (SUTURE) ×2
SUT SILK 3 0 SH CR/8 (SUTURE) ×3 IMPLANT
SUT SILK 3-0 18XBRD TIE 12 (SUTURE) ×1 IMPLANT
SUT VIC AB 3-0 SH 18 (SUTURE) ×3 IMPLANT
TOWEL OR 17X26 10 PK STRL BLUE (TOWEL DISPOSABLE) ×9 IMPLANT
TRAY FOLEY CATH 14FRSI W/METER (CATHETERS) IMPLANT
TRAY FOLEY CATH 16FRSI W/METER (SET/KITS/TRAYS/PACK) ×3 IMPLANT
WATER STERILE IRR 1500ML POUR (IV SOLUTION) IMPLANT
YANKAUER SUCT BULB TIP NO VENT (SUCTIONS) ×6 IMPLANT

## 2013-05-27 NOTE — Op Note (Signed)
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 The patient has made a steady recovery and has regained good health and is strongly interested in takedown of his ileostomy. After a thorough workup and discussion regarding options and risks detailed elsewhere we elected to proceed with takedown of his ileostomy.  Procedure Detail: patient was brought to the operating room, placed in the supine position on the operating table, and general endotracheal anesthesia induced. He received broad-spectrum preoperative IV antibiotics. PAS replaced. He was carefully positioned and padded in yellowfin stirrups. I closed the abdomen and the ileostomy with a pursestring 0 silk suture. The perineum and abdomen were widely sterilely prepped and draped. Patient timeout was performed and the procedure verified. I excised the previous wide midline skin scar and dissection was carried down to midline fascia which was healthy and intact. The suture material was removed and the peritoneal cavity was entered and there were actually very minimal anterior peritoneal adhesions. All bowel adhesions were then lysed completely clearing the anterior abdominal wall in dissecting the adhesions up out of the pelvis. All these were filmy and relatively easy to deal with. The ileostomy was isolated and dissected up to the abdominal wall it was divided at the level of the peritoneum with the GIA 75 mm stapler. Attention was turned to the pelvis. The rectal stump was fairly short, below the pelvic brim. I did identify  the staple line a small amount of suture material which was essentially flush of the pelvic floor. The bladder and bladder peritoneum was dissected up off of the rectum with careful sharp and blunt dissection and reflected anteriorly. Dr. Martinique then went below and passed the 25 mm anvil easily up to the end of the rectal pouch at about 12-13 cm. Using this as a guide I further cleared the end of the rectal stump back in all directions but did not do any extensive lateral or posterior dissection, clearing just enough of the rectal stump to allow clear visualization of the stapled anastomosis. A 25 mm anvil was inserted into the end of the terminal ileum after excising the staple line and was advanced into the ileum and the end of the ileum closed with the GIA 75 mm stapler. The anvil spike was then brought out through the antimesenteric side of the terminal ileum through a small opening. Dr. Harlow Asa went below and passed the 25 mm EEA stapler transanally 2 the end of the rectal stump and the spike advanced through the end of the rectal stump with good visualization. This was attached to the anvil in the ileum being careful not to twist the bowel from the mesentery and was closed excluding any extraneous tissue. Left closed for 30 seconds for hemostasis and then fired and removed without difficulty. Both doughnuts appeared intact with full thickness bowel wall layers. With the ileum clamped just proximal to the anastomosis Dr. Martinique performed a sigmoidoscopy and with the rectum tensely distended with air distending the ileum there was no evidence of leak under saline irrigation. The bowel was desufflated. There was no tension and good blood supply. At this point the ileostomy was elliptically excised from the skin and subcutaneous  tissue down to the level of the fascia which was incised and the ileostomy removed. All gloves and instruments were changed. The abdomen was thoroughly irrigated. Seprafilm was placed over  loops of small bowel intra-abdominally. The fascial defect at the ileostomy was closed with figure-of-eight 0 Novafil.  The midline incision was closed with running looped #1 PDS begun at either of the incision with periodic full-thickness 0 Novafil internal retention sutures. Subcutaneous tissue was irrigated and skin closed with staples. Sponge needle and instrument counts were correct.    Findings: As above  Estimated Blood Loss:  Minimal         Drains: none  Blood Given: none          Specimens: ileostomy and rectal doughnut from EEA anastomosis        Complications:  * No complications entered in OR log *         Disposition: PACU - hemodynamically stable.         Condition: stable

## 2013-05-27 NOTE — Progress Notes (Signed)
Spoke with Dr. Excell Seltzer re bowel prep. No bowel prep needed.

## 2013-05-27 NOTE — Anesthesia Preprocedure Evaluation (Signed)
Anesthesia Evaluation  Patient identified by MRN, date of birth, ID band Patient awake and Patient confused    Reviewed: Allergy & Precautions, H&P , NPO status , Patient's Chart, lab work & pertinent test results  History of Anesthesia Complications Negative for: history of anesthetic complications  Airway Mallampati: II TM Distance: >3 FB Neck ROM: Full    Dental  (+) Teeth Intact, Dental Advisory Given   Pulmonary pneumonia -, resolved, former smoker,  breath sounds clear to auscultation        Cardiovascular + dysrhythmias Atrial Fibrillation Rhythm:Regular Rate:Normal     Neuro/Psych negative psych ROS   GI/Hepatic Neg liver ROS, PUD, c-diff   Endo/Other  Adrenal isufficiency  Renal/GU CRFRenal disease     Musculoskeletal   Abdominal   Peds  Hematology  (+) Blood dyscrasia, anemia ,   Anesthesia Other Findings   Reproductive/Obstetrics                           Anesthesia Physical  Anesthesia Plan  ASA: II  Anesthesia Plan: General   Post-op Pain Management:    Induction: Intravenous  Airway Management Planned: Oral ETT  Additional Equipment:   Intra-op Plan:   Post-operative Plan:   Informed Consent: I have reviewed the patients History and Physical, chart, labs and discussed the procedure including the risks, benefits and alternatives for the proposed anesthesia with the patient or authorized representative who has indicated his/her understanding and acceptance.   Dental advisory given  Plan Discussed with: CRNA  Anesthesia Plan Comments:         Anesthesia Quick Evaluation                                   Anesthesia Evaluation  Patient identified by MRN, date of birth, ID band Patient awake    Reviewed: Allergy & Precautions, H&P , NPO status , Patient's Chart, lab work & pertinent test results  Airway Mallampati: II TM Distance: >3 FB Neck ROM:  Full    Dental  (+) Teeth Intact and Dental Advisory Given   Pulmonary          Cardiovascular Rhythm:Regular Rate:Normal     Neuro/Psych    GI/Hepatic PUD,   Endo/Other    Renal/GU CRFRenal disease     Musculoskeletal   Abdominal   Peds  Hematology   Anesthesia Other Findings   Reproductive/Obstetrics                          Anesthesia Physical Anesthesia Plan  ASA: III  Anesthesia Plan: General   Post-op Pain Management:    Induction: Intravenous, Rapid sequence and Cricoid pressure planned  Airway Management Planned: Oral ETT  Additional Equipment:   Intra-op Plan:   Post-operative Plan: Possible Post-op intubation/ventilation  Informed Consent: I have reviewed the patients History and Physical, chart, labs and discussed the procedure including the risks, benefits and alternatives for the proposed anesthesia with the patient or authorized representative who has indicated his/her understanding and acceptance.     Plan Discussed with: CRNA and Surgeon  Anesthesia Plan Comments: (Pt very sedated. Has NGtube in place. 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 Will need ETTube to protect his airway perioperatively. Would he no behnefit from remaining intubated till his belly is finally closed? Plan: ETT for airway management. Lillia Abed MD)  Anesthesia Quick Evaluation

## 2013-05-27 NOTE — Transfer of Care (Signed)
Immediate Anesthesia Transfer of Care Note  Patient: Connor Small  Procedure(s) Performed: Procedure(s): ILEOSTOMY TAKEDOWN WITH ILEOPROCTOSTOMY (N/A)  Patient Location: PACU  Anesthesia Type:General  Level of Consciousness: awake, alert  and oriented  Airway & Oxygen Therapy: Patient Spontanous Breathing and Patient connected to face mask oxygen  Post-op Assessment: Report given to PACU RN and Post -op Vital signs reviewed and stable  Post vital signs: Reviewed and stable  Complications: No apparent anesthesia complications

## 2013-05-27 NOTE — Anesthesia Postprocedure Evaluation (Signed)
  Anesthesia Post-op Note  Patient: Connor Small  Procedure(s) Performed: Procedure(s) (LRB): ILEOSTOMY TAKEDOWN WITH ILEOPROCTOSTOMY (N/A)  Patient Location: PACU  Anesthesia Type: General  Level of Consciousness: awake and alert   Airway and Oxygen Therapy: Patient Spontanous Breathing  Post-op Pain: mild  Post-op Assessment: Post-op Vital signs reviewed, Patient's Cardiovascular Status Stable, Respiratory Function Stable, Patent Airway and No signs of Nausea or vomiting  Last Vitals:  Filed Vitals:   05/27/13 1545  BP: 110/61  Pulse: 56  Temp:   Resp: 9    Post-op Vital Signs: stable   Complications: No apparent anesthesia complications

## 2013-05-27 NOTE — Interval H&P Note (Signed)
History and Physical Interval Note:  05/27/2013 11:09 AM  Connor Small  has presented today for surgery, with the diagnosis of ileostomy status post subtotal colectomy  The various methods of treatment have been discussed with the patient and family. After consideration of risks, benefits and other options for treatment, the patient has consented to  Procedure(s): ILEOSTOMY TAKEDOWN WITH ILEOPROCTOSTOMY (N/A) as a surgical intervention .  The patient's history has been reviewed, patient examined, no change in status, stable for surgery.  I have reviewed the patient's chart and labs.  Questions were answered to the patient's satisfaction.     Darene Lamer Zanyia Silbaugh

## 2013-05-27 NOTE — H&P (View-Only) (Signed)
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 He had a long postoperative course with respiratory failure and renal failure and wound dehiscence. He however is made a continued steady recovery. 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 Not having any abdominal complaints. He recently had a flexible sigmoidoscopy about one month ago showing fairly severe diversion proctitis. He has been on Canasa suppositories and notices decreased rectal mucus. He otherwise has no pain. Appetite is good. He had contracture release on his right hand and this is much improved.    Past Medical History  Diagnosis Date  . Chronic diarrhea   . Rectal bleed   . Hemorrhoids   . Ulcerative colitis     Distal UC over 8 yrs ago diagnosed  . Diverticulitis of large intestine with perforation 10/2011    done at [place]  . 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 history of UC (ulcerative colitis) 12/06/2010   Past Surgical History  Procedure Laterality Date  . Colonoscopy  9/03  . Sigmoidoscopy       06/13/2002  . Temporary ostomy November 06, 2010      for a colon perforation that was done in [facility] (Dr Payton Doughty).    . Colonoscopy  02/16/2011    Procedure: COLONOSCOPY;  Surgeon: Rogene Houston, MD;  [facility];  Service: Endoscopy;  Laterality: N/A;  100  . Flexible sigmoidoscopy  02/27/2012    Procedure: FLEXIBLE SIGMOIDOSCOPY;  Surgeon: Rogene Houston, MD;  [facility];  Service: Endoscopy;  Laterality: N/A;  with colonic decompression  . Laparotomy  02/27/2012    Procedure: EXPLORATORY LAPAROTOMY;  Surgeon: Donato Heinz, MD;  Location: [facility];  Service: General;  Laterality: N/A;  . Cecostomy  02/27/2012    Procedure: CECOSTOMY;  Surgeon: Donato Heinz, MD;  Location: [facility];  Service: General;   Laterality: N/A;  Cecostomy Tube Placement  . Colectomy  02/28/2012    Procedure: TOTAL COLECTOMY;  Surgeon: Donato Heinz, MD;  Location: [facility];  Service: General;  Laterality: N/A;  . Ileostomy  02/28/2012    Procedure: ILEOSTOMY;  Surgeon: Donato Heinz, MD;  Location: [facility];  Service: General;  Laterality: N/A;  . Cystoscopy w/ ureteral stent placement Bilateral 03/20/2012    Procedure: CYSTOSCOPY WITH RETROGRADE PYELOGRAM/URETERAL STENT PLACEMENT;  Surgeon: Alexis Frock, MD;  Location: [place];  Service: Urology;  Laterality: Bilateral;  . Laparotomy N/A 03/26/2012    Procedure: EXPLORATORY LAPAROTOMY;  Surgeon: Edward Jolly, MD;  Location: [place];  Service: General;  Laterality: N/A;  . Application of wound vac N/A 03/26/2012    Procedure: APPLICATION OF WOUND VAC;  Surgeon: Edward Jolly, MD;  Location: [facility];  Service: General;  Laterality: N/A;  . Liver biopsy N/A 03/26/2012    Procedure: LIVER BIOPSY;  Surgeon: Edward Jolly, MD;  Location: [place];  Service: General;  Laterality: N/A;  . Laparotomy N/A 03/29/2012    Procedure: EXPLORATORY LAPAROTOMY, PARTIAL WOUND CLOSURE;  Surgeon: Edward Jolly, MD;  Location: [place];  Service: General;  Laterality: N/A;  . Vacuum assisted closure change N/A 03/29/2012    Procedure: Open ABDOMINAL VACUUM  CHANGE;  Surgeon: Edward Jolly, MD;  Location: [place];  Service: General;  Laterality: N/A;  . Vacuum assisted closure change N/A  04/01/2012    Procedure: removal of abdominal vac dressing and abdominal closure;  Surgeon: Edward Jolly, MD;  Location: [place];  Service: General;  Laterality: N/A;  . Flexible sigmoidoscopy N/A 02/06/2013    Procedure: FLEXIBLE SIGMOIDOSCOPY;  Surgeon: Rogene Houston, MD;  [facility];  Service: Endoscopy;  Laterality: N/A;  100-moved to 1200 Ann notified pt  . Flexible sigmoidoscopy N/A 03/27/2013    Procedure: FLEXIBLE SIGMOIDOSCOPY;  Surgeon: Rogene Houston, MD;  [facility];  Service: Endoscopy;  Laterality: N/A;  225   Current Outpatient Prescriptions  Medication Sig Dispense Refill  . fluticasone (FLONASE) 50 MCG/ACT nasal spray Place 1 spray into both nostrils daily.       . mesalamine (CANASA) 1000 MG suppository Place 1 suppository (1,000 mg total) rectally at bedtime.  30 suppository  3  . Multiple Vitamin (MULTIVITAMIN) LIQD Take 5 mLs by mouth daily.      Marland Kitchen saccharomyces boulardii (FLORASTOR) 250 MG capsule Take 250 mg by mouth 2 (two) times daily.      Marland Kitchen aspirin 81 MG chewable tablet Chew 81 mg by mouth daily.       No current facility-administered medications for this visit.   Allergies  Allergen Reactions  . Penicillins Other (See Comments)    Heart rate changes  . Claritin [Loratadine]     Rash  . Clindamycin/Lincomycin Diarrhea    C-diff  . Morphine And Related     Nausea/vomiting  . Imipenem Rash   Exam: BP 122/74  Pulse 75  Temp(Src) 97.3 F (36.3 C)  Resp 14  Ht 5' 8"  (1.727 m)  Wt 161 lb 3.2 oz (73.12 kg)  BMI 24.52 kg/m2 General: well-appearing Caucasian male no distress Skin: No rash infection Lungs: Clear breath sounds bilaterally Cardiac: Regular rate and rhythm without murmurs. No edema Abdomen: Long healed midline incision. Ileostomy right lower abdomen. He has a healed colostomy site, all in the left lower quadrant from a colon perforation years ago of uncertain etiology. Rectal: No external abnormalities, normal tone, no masses or stricture or unusual tenderness or blood Extremities: Still some contractures of the right hand but improved Neurologic: Alert and oriented. Gait normal.  Assessment and plan: Status post ileostomy as above. He strongly desires reversal. I believe he would be an appropriate candidate. Since his last visit Dr. 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  I believe we can go ahead and work on  scheduling takedown of ileostomy. We discussed the nature of the surgery and expected recovery period discussed risks of anesthetic complications, bleeding, infection, anastomotic leak and possible need to create another ostomy if he had complications. All her questions were answered. Await sigmoidoscopy.

## 2013-05-28 ENCOUNTER — Encounter (HOSPITAL_COMMUNITY): Payer: Self-pay | Admitting: General Surgery

## 2013-05-28 LAB — CBC
HCT: 34.8 % — ABNORMAL LOW (ref 39.0–52.0)
HEMOGLOBIN: 11.5 g/dL — AB (ref 13.0–17.0)
MCH: 30.4 pg (ref 26.0–34.0)
MCHC: 33 g/dL (ref 30.0–36.0)
MCV: 92.1 fL (ref 78.0–100.0)
Platelets: 226 10*3/uL (ref 150–400)
RBC: 3.78 MIL/uL — AB (ref 4.22–5.81)
RDW: 14.1 % (ref 11.5–15.5)
WBC: 10.3 10*3/uL (ref 4.0–10.5)

## 2013-05-28 LAB — BASIC METABOLIC PANEL
BUN: 17 mg/dL (ref 6–23)
CALCIUM: 7.6 mg/dL — AB (ref 8.4–10.5)
CO2: 24 meq/L (ref 19–32)
Chloride: 106 mEq/L (ref 96–112)
Creatinine, Ser: 1.24 mg/dL (ref 0.50–1.35)
GFR calc Af Amer: 74 mL/min — ABNORMAL LOW (ref >90)
GFR calc non Af Amer: 64 mL/min — ABNORMAL LOW (ref >90)
GLUCOSE: 122 mg/dL — AB (ref 70–99)
Potassium: 4.1 mEq/L (ref 3.7–5.3)
SODIUM: 139 meq/L (ref 137–147)

## 2013-05-28 NOTE — Progress Notes (Signed)
Patient ID: Connor Small, male   DOB: 1960/05/23, 53 y.o.   MRN: [id number] 1 Day Post-Op  Subjective: Some expected incisional pain, pretty well controlled with PCA.  Foley out has not voided yet, no nausea.  Has been out of bed  Objective: Vital signs in last 24 hours: Temp:  [97.2 F (36.2 C)-98.7 F (37.1 C)] 98.7 F (37.1 C) (05/05 0936) Pulse Rate:  [52-84] 75 (05/05 0936) Resp:  [7-20] 18 (05/05 0936) BP: (104-125)/(60-73) 104/64 mmHg (05/05 0936) SpO2:  [97 %-100 %] 99 % (05/05 0936) Weight:  [161 lb 3.2 oz (73.12 kg)] 161 lb 3.2 oz (73.12 kg) (05/04 1600) Last BM Date: 05/27/13  Intake/Output from previous day: 05/04 0701 - 05/05 0700 In: 3741.7 [I.V.:3741.7] Out: 2075 [Urine:2075] Intake/Output this shift:    General appearance: alert, cooperative and no distress Resp: No wheezing or increased WOB GI: Jumpy with light touch but then approprite incisional tenderness Incision/Wound: No erythema or drainage  Lab Results:   Recent Labs  05/28/13 0400  WBC 10.3  HGB 11.5*  HCT 34.8*  PLT 226   BMET  Recent Labs  05/28/13 0400  NA 139  K 4.1  CL 106  CO2 24  GLUCOSE 122*  BUN 17  CREATININE 1.24  CALCIUM 7.6*     Studies/Results: No results found.  Anti-infectives: Anti-infectives   Start     Dose/Rate Route Frequency Ordered Stop   05/27/13 0929  neomycin (MYCIFRADIN) tablet 1,000 mg  Status:  Discontinued     1,000 mg Oral 3 times per day 05/27/13 0929 05/27/13 0948   05/27/13 0929  erythromycin (E-MYCIN) tablet 1,000 mg  Status:  Discontinued     1,000 mg Oral 3 times per day 05/27/13 0929 05/27/13 0948   05/26/13 1931  clindamycin (CLEOCIN) IVPB 900 mg     900 mg 100 mL/hr over 30 Minutes Intravenous 60 min pre-op 05/26/13 1931 05/27/13 1220   05/26/13 1931  gentamicin (GARAMYCIN) 350 mg in dextrose 5 % 100 mL IVPB     5 mg/kg  70.3 kg 108.8 mL/hr over 60 Minutes Intravenous 60 min pre-op 05/26/13 1931 05/27/13 1239       Assessment/Plan: s/p Procedure(s): ILEOSTOMY TAKEDOWN WITH ILEOPROCTOSTOMY Stable post op Start CL diet.  OOB   LOS: 1 day    Edward Jolly 05/28/2013

## 2013-05-29 LAB — CBC
HEMATOCRIT: 35.2 % — AB (ref 39.0–52.0)
Hemoglobin: 11.7 g/dL — ABNORMAL LOW (ref 13.0–17.0)
MCH: 31.3 pg (ref 26.0–34.0)
MCHC: 33.2 g/dL (ref 30.0–36.0)
MCV: 94.1 fL (ref 78.0–100.0)
Platelets: 198 10*3/uL (ref 150–400)
RBC: 3.74 MIL/uL — AB (ref 4.22–5.81)
RDW: 14.2 % (ref 11.5–15.5)
WBC: 11.1 10*3/uL — ABNORMAL HIGH (ref 4.0–10.5)

## 2013-05-29 LAB — BASIC METABOLIC PANEL
BUN: 11 mg/dL (ref 6–23)
CHLORIDE: 105 meq/L (ref 96–112)
CO2: 25 meq/L (ref 19–32)
CREATININE: 1.22 mg/dL (ref 0.50–1.35)
Calcium: 8.2 mg/dL — ABNORMAL LOW (ref 8.4–10.5)
GFR calc Af Amer: 75 mL/min — ABNORMAL LOW (ref >90)
GFR calc non Af Amer: 65 mL/min — ABNORMAL LOW (ref >90)
GLUCOSE: 141 mg/dL — AB (ref 70–99)
Potassium: 4 mEq/L (ref 3.7–5.3)
Sodium: 139 mEq/L (ref 137–147)

## 2013-05-29 MED ORDER — ACETAMINOPHEN 325 MG PO TABS
650.0000 mg | ORAL_TABLET | ORAL | Status: DC | PRN
  Administered 2013-05-29 – 2013-06-01 (×2): 650 mg via ORAL
  Filled 2013-05-29 (×2): qty 2

## 2013-05-29 NOTE — Progress Notes (Signed)
Patient ID: Connor Small, male   DOB: 07/07/1960, 53 y.o.   MRN: [id number] 2 Days Post-Op  Subjective: Feels better today. Somewhat less incisional pain. No nausea but does not really feel like drinking much yet. Had 1 loose bowel movement. Voiding okay. Ambulatory.  Objective: Vital signs in last 24 hours: Temp:  [98.3 F (36.8 C)-99 F (37.2 C)] 98.7 F (37.1 C) (05/06 0630) Pulse Rate:  [69-83] 75 (05/06 0630) Resp:  [14-75] 16 (05/06 0630) BP: (116-129)/(71-77) 119/77 mmHg (05/06 0630) SpO2:  [99 %-100 %] 99 % (05/06 0630) FiO2 (%):  [96 %] 96 % (05/06 0800) Last BM Date: 05/27/13  Intake/Output from previous day: 05/05 0701 - 05/06 0700 In: 3500 [I.V.:3500] Out: 6 [Urine:5; Stool:1] Intake/Output this shift: Total I/O In: 120 [P.O.:120] Out: 1 [Urine:1]  General appearance: alert, cooperative and no distress GI: bowel sounds present. Minimal distention. Appropriate mild tenderness. Incision/Wound: honeycomb dressing intact, no erythema or apparent drainage  Lab Results:   Recent Labs  05/28/13 0400 05/29/13 0400  WBC 10.3 11.1*  HGB 11.5* 11.7*  HCT 34.8* 35.2*  PLT 226 198   BMET  Recent Labs  05/28/13 0400 05/29/13 0400  NA 139 139  K 4.1 4.0  CL 106 105  CO2 24 25  GLUCOSE 122* 141*  BUN 17 11  CREATININE 1.24 1.22  CALCIUM 7.6* 8.2*     Studies/Results: No results found.  Anti-infectives: Anti-infectives   Start     Dose/Rate Route Frequency Ordered Stop   05/27/13 0929  neomycin (MYCIFRADIN) tablet 1,000 mg  Status:  Discontinued     1,000 mg Oral 3 times per day 05/27/13 0929 05/27/13 0948   05/27/13 0929  erythromycin (E-MYCIN) tablet 1,000 mg  Status:  Discontinued     1,000 mg Oral 3 times per day 05/27/13 0929 05/27/13 0948   05/26/13 1931  clindamycin (CLEOCIN) IVPB 900 mg     900 mg 100 mL/hr over 30 Minutes Intravenous 60 min pre-op 05/26/13 1931 05/27/13 1220   05/26/13 1931  gentamicin (GARAMYCIN) 350 mg in dextrose 5 % 100  mL IVPB     5 mg/kg  70.3 kg 108.8 mL/hr over 60 Minutes Intravenous 60 min pre-op 05/26/13 1931 05/27/13 1239      Assessment/Plan: s/p Procedure(s): ILEOSTOMY TAKEDOWN WITH ILEOPROCTOSTOMY Doing well. Mild elevated WBC, repeat tomorrow Increase activity. No other changes today.   LOS: 2 days    Edward Jolly 05/29/2013

## 2013-05-30 LAB — BASIC METABOLIC PANEL
BUN: 12 mg/dL (ref 6–23)
CALCIUM: 8.5 mg/dL (ref 8.4–10.5)
CO2: 25 mEq/L (ref 19–32)
CREATININE: 1.18 mg/dL (ref 0.50–1.35)
Chloride: 105 mEq/L (ref 96–112)
GFR, EST AFRICAN AMERICAN: 79 mL/min — AB (ref >90)
GFR, EST NON AFRICAN AMERICAN: 68 mL/min — AB (ref >90)
Glucose, Bld: 127 mg/dL — ABNORMAL HIGH (ref 70–99)
Potassium: 3.9 mEq/L (ref 3.7–5.3)
Sodium: 140 mEq/L (ref 137–147)

## 2013-05-30 LAB — CBC
HEMATOCRIT: 35.2 % — AB (ref 39.0–52.0)
Hemoglobin: 11.8 g/dL — ABNORMAL LOW (ref 13.0–17.0)
MCH: 31.4 pg (ref 26.0–34.0)
MCHC: 33.5 g/dL (ref 30.0–36.0)
MCV: 93.6 fL (ref 78.0–100.0)
PLATELETS: 206 10*3/uL (ref 150–400)
RBC: 3.76 MIL/uL — ABNORMAL LOW (ref 4.22–5.81)
RDW: 13.9 % (ref 11.5–15.5)
WBC: 11 10*3/uL — ABNORMAL HIGH (ref 4.0–10.5)

## 2013-05-30 MED ORDER — OXYCODONE-ACETAMINOPHEN 5-325 MG PO TABS: 1.0000 | ORAL_TABLET | ORAL | Status: DC | PRN

## 2013-05-30 NOTE — Progress Notes (Signed)
Patient ID: Connor Small, male   DOB: April 26, 1960, 53 y.o.   MRN: [id number] 3 Days Post-Op  Subjective: Frequent loose bowel movements overnight. Abdominal pain is improving. Still some occasional nausea and not much appetite.  Objective: Vital signs in last 24 hours: Temp:  [98.4 F (36.9 C)-99.3 F (37.4 C)] 98.4 F (36.9 C) (05/07 0555) Pulse Rate:  [72-85] 72 (05/07 0555) Resp:  [14-16] 16 (05/07 0555) BP: (127-133)/(70-83) 132/76 mmHg (05/07 0555) SpO2:  [96 %-100 %] 100 % (05/07 0555) FiO2 (%):  [35 %-96 %] 42 % (05/07 0400) Last BM Date: 05/30/13  Intake/Output from previous day: 05/06 0701 - 05/07 0700 In: 2920.8 [P.O.:840; I.V.:2080.8] Out: 1 [Urine:1] Intake/Output this shift:    General appearance: alert, cooperative and no distress GI: minimal appropriate tenderness. No distention. Incision/Wound: clean and dry  Lab Results:   Recent Labs  05/29/13 0400 05/30/13 0355  WBC 11.1* 11.0*  HGB 11.7* 11.8*  HCT 35.2* 35.2*  PLT 198 206   BMET  Recent Labs  05/29/13 0400 05/30/13 0355  NA 139 140  K 4.0 3.9  CL 105 105  CO2 25 25  GLUCOSE 141* 127*  BUN 11 12  CREATININE 1.22 1.18  CALCIUM 8.2* 8.5     Studies/Results: No results found.  Anti-infectives: Anti-infectives   Start     Dose/Rate Route Frequency Ordered Stop   05/27/13 0929  neomycin (MYCIFRADIN) tablet 1,000 mg  Status:  Discontinued     1,000 mg Oral 3 times per day 05/27/13 0929 05/27/13 0948   05/27/13 0929  erythromycin (E-MYCIN) tablet 1,000 mg  Status:  Discontinued     1,000 mg Oral 3 times per day 05/27/13 0929 05/27/13 0948   05/26/13 1931  clindamycin (CLEOCIN) IVPB 900 mg     900 mg 100 mL/hr over 30 Minutes Intravenous 60 min pre-op 05/26/13 1931 05/27/13 1220   05/26/13 1931  gentamicin (GARAMYCIN) 350 mg in dextrose 5 % 100 mL IVPB     5 mg/kg  70.3 kg 108.8 mL/hr over 60 Minutes Intravenous 60 min pre-op 05/26/13 1931 05/27/13 1239      Assessment/Plan: s/p  Procedure(s): ILEOSTOMY TAKEDOWN WITH ILEOPROCTOSTOMY Initially very frequent bowel movements, expected with the amount of colon he has left.  Observe for now and should begin to slow down. Advanced to full liquid diet. DC PCA and changed to oral pain medications. Overall doing well.   LOS: 3 days    Edward Jolly 05/30/2013

## 2013-05-31 LAB — BASIC METABOLIC PANEL
BUN: 15 mg/dL (ref 6–23)
CHLORIDE: 108 meq/L (ref 96–112)
CO2: 25 mEq/L (ref 19–32)
Calcium: 8.5 mg/dL (ref 8.4–10.5)
Creatinine, Ser: 1.32 mg/dL (ref 0.50–1.35)
GFR, EST AFRICAN AMERICAN: 69 mL/min — AB (ref >90)
GFR, EST NON AFRICAN AMERICAN: 59 mL/min — AB (ref >90)
Glucose, Bld: 102 mg/dL — ABNORMAL HIGH (ref 70–99)
POTASSIUM: 3.9 meq/L (ref 3.7–5.3)
Sodium: 143 mEq/L (ref 137–147)

## 2013-05-31 LAB — CBC
HCT: 32 % — ABNORMAL LOW (ref 39.0–52.0)
Hemoglobin: 10.5 g/dL — ABNORMAL LOW (ref 13.0–17.0)
MCH: 30.4 pg (ref 26.0–34.0)
MCHC: 32.8 g/dL (ref 30.0–36.0)
MCV: 92.8 fL (ref 78.0–100.0)
PLATELETS: 209 10*3/uL (ref 150–400)
RBC: 3.45 MIL/uL — ABNORMAL LOW (ref 4.22–5.81)
RDW: 13.9 % (ref 11.5–15.5)
WBC: 7 10*3/uL (ref 4.0–10.5)

## 2013-05-31 NOTE — Discharge Instructions (Signed)
[facility], [place] [phone]  OPEN ABDOMINAL SURGERY: POST OP INSTRUCTIONS  Always review your discharge instruction sheet given to you by the facility where your surgery was performed.  IF YOU HAVE DISABILITY OR FAMILY LEAVE FORMS, YOU MUST BRING THEM TO THE OFFICE FOR PROCESSING.  PLEASE DO NOT GIVE THEM TO YOUR DOCTOR.  1. A prescription for pain medication may be given to you upon discharge.  Take your pain medication as prescribed, if needed.  If narcotic pain medicine is not needed, then you may take acetaminophen (Tylenol) or ibuprofen (Advil) as needed. 2. Take your usually prescribed medications unless otherwise directed. 3. If you need a refill on your pain medication, please contact your pharmacy. They will contact our office to request authorization.  Prescriptions will not be filled after 5pm or on week-ends. 4. You should follow a light diet the first few days after arrival home, such as soup and crackers, pudding, etc.unless your doctor has advised otherwise. A high-fiber, low fat diet can be resumed as tolerated.   Be sure to include lots of fluids daily. Most patients will experience some swelling and bruising on the chest and neck area.  Ice packs will help.  Swelling and bruising can take several days to resolve 5. Most patients will experience some swelling and bruising in the area of the incision. Ice pack will help. Swelling and bruising can take several days to resolve..  6. It is common to experience some constipation if taking pain medication after surgery.  Increasing fluid intake and taking a stool softener will usually help or prevent this problem from occurring.  A mild laxative (Milk of Magnesia or Miralax) should be taken according to package directions if there are no bowel movements after 48 hours. 7.  You may have steri-strips (small skin tapes) in place directly over the incision.  These strips should be left on the skin for 7-10 days.  If your  surgeon used skin glue on the incision, you may shower in 24 hours.  The glue will flake off over the next 2-3 weeks.  Any sutures or staples will be removed at the office during your follow-up visit. You may find that a light gauze bandage over your incision may keep your staples from being rubbed or pulled. You may shower and replace the bandage daily. 8. ACTIVITIES:  You may resume regular (light) daily activities beginning the next day--such as daily self-care, walking, climbing stairs--gradually increasing activities as tolerated.  You may have sexual intercourse when it is comfortable.  Refrain from any heavy lifting or straining until approved by your doctor. a. You may drive when you no longer are taking prescription pain medication, you can comfortably wear a seatbelt, and you can safely maneuver your car and apply brakes b. Return to Work: __6-8 weeks after surgery date_________________________________ 9. You should see your doctor in the office for a follow-up appointment approximately two weeks after your surgery.  Make sure that you call for this appointment within a day or two after you arrive home to insure a convenient appointment time. OTHER INSTRUCTIONS:   _may use Imodium( over-the-counter) as needed for excessively loose or frequent stools____________________________________________________________ _____________________________________________________________  WHEN TO CALL YOUR DOCTOR: 1. Fever over 101.0 2. Inability to urinate 3. Nausea and/or vomiting 4. Extreme swelling or bruising 5. Continued bleeding from incision. 6. Increased pain, redness, or drainage from the incision. 7. Difficulty swallowing or breathing 8. Muscle cramping or spasms. 9. Numbness  or tingling in hands or feet or around lips.  The clinic staff is available to answer your questions during regular business hours.  Please dont hesitate to call and ask to speak to one of the nurses if you have  concerns.  For further questions, please visit [email or web address]

## 2013-05-31 NOTE — Care Management Note (Signed)
    Page 1 of 1   05/31/2013     12:00:29 PM CARE MANAGEMENT NOTE 05/31/2013  Patient:  Connor Small, Connor Small   Account Number:  [id number]  Date Initiated:  05/31/2013  Documentation initiated by:  Sunday Spillers  Subjective/Objective Assessment:   53 yo male admitted s/p Ileostomy takedown. PTA lived at home wth spouse.     Action/Plan:   Home when stable   Anticipated DC Date:  06/01/2013   Anticipated DC Plan:  [place]  CM consult      Choice offered to / List presented to:             Status of service:  Completed, signed off Medicare Important Message given?   (If response is "NO", the following Medicare IM given date fields will be blank) Date Medicare IM given:   Date Additional Medicare IM given:    Discharge Disposition:  HOME/SELF CARE  Per UR Regulation:  Reviewed for med. necessity/level of care/duration of stay  If discussed at [place] of Stay Meetings, dates discussed:    Comments:

## 2013-05-31 NOTE — Progress Notes (Signed)
Patient ID: Connor Small, male   DOB: 1960-08-31, 53 y.o.   MRN: [id number] 4 Days Post-Op  Subjective: No complaints this morning. Bowel movements have slowed down now one every 3-4 hours. Has taken some full liquids without nausea but does not like cuts on the tray. No abnormal pain and not taking pain medication.  Objective: Vital signs in last 24 hours: Temp:  [98.4 F (36.9 C)-99.4 F (37.4 C)] 98.4 F (36.9 C) (05/08 0603) Pulse Rate:  [61-73] 61 (05/08 0603) Resp:  [18] 18 (05/08 0603) BP: (112-117)/(72-75) 112/72 mmHg (05/08 0603) SpO2:  [99 %-100 %] 100 % (05/08 0603) Last BM Date: 05/30/13  Intake/Output from previous day: 05/07 0701 - 05/08 0700 In: 1767.5 [I.V.:1767.5] Out: 350 [Urine:350] Intake/Output this shift:    General appearance: alert, cooperative and no distress GI: normal findings: soft, non-tender Incision/Wound: no erythema or drainage  Lab Results:   Recent Labs  05/30/13 0355 05/31/13 0423  WBC 11.0* 7.0  HGB 11.8* 10.5*  HCT 35.2* 32.0*  PLT 206 209   BMET  Recent Labs  05/30/13 0355 05/31/13 0423  NA 140 143  K 3.9 3.9  CL 105 108  CO2 25 25  GLUCOSE 127* 102*  BUN 12 15  CREATININE 1.18 1.32  CALCIUM 8.5 8.5     Studies/Results: No results found.  Anti-infectives: Anti-infectives   Start     Dose/Rate Route Frequency Ordered Stop   05/27/13 0929  neomycin (MYCIFRADIN) tablet 1,000 mg  Status:  Discontinued     1,000 mg Oral 3 times per day 05/27/13 0929 05/27/13 0948   05/27/13 0929  erythromycin (E-MYCIN) tablet 1,000 mg  Status:  Discontinued     1,000 mg Oral 3 times per day 05/27/13 0929 05/27/13 0948   05/26/13 1931  clindamycin (CLEOCIN) IVPB 900 mg     900 mg 100 mL/hr over 30 Minutes Intravenous 60 min pre-op 05/26/13 1931 05/27/13 1220   05/26/13 1931  gentamicin (GARAMYCIN) 350 mg in dextrose 5 % 100 mL IVPB     5 mg/kg  70.3 kg 108.8 mL/hr over 60 Minutes Intravenous 60 min pre-op 05/26/13 1931 05/27/13  1239      Assessment/Plan: s/p Procedure(s): ILEOSTOMY TAKEDOWN WITH ILEOPROCTOSTOMY Doing well postoperatively without apparent complication. Advanced to regular diet and follow oral intake. Anticipate discharge tomorrow. Discussed with patient and wife using Imodium gradually at home if bowel movements remained very frequent. Reluctant to use that yet and so early postop.   LOS: 4 days    Edward Jolly 05/31/2013

## 2013-06-01 LAB — CBC
HEMATOCRIT: 31.5 % — AB (ref 39.0–52.0)
HEMOGLOBIN: 10.5 g/dL — AB (ref 13.0–17.0)
MCH: 30.7 pg (ref 26.0–34.0)
MCHC: 33.3 g/dL (ref 30.0–36.0)
MCV: 92.1 fL (ref 78.0–100.0)
Platelets: 231 10*3/uL (ref 150–400)
RBC: 3.42 MIL/uL — ABNORMAL LOW (ref 4.22–5.81)
RDW: 13.8 % (ref 11.5–15.5)
WBC: 6.5 10*3/uL (ref 4.0–10.5)

## 2013-06-01 LAB — BASIC METABOLIC PANEL
BUN: 20 mg/dL (ref 6–23)
CO2: 25 mEq/L (ref 19–32)
Calcium: 8.6 mg/dL (ref 8.4–10.5)
Chloride: 106 mEq/L (ref 96–112)
Creatinine, Ser: 1.37 mg/dL — ABNORMAL HIGH (ref 0.50–1.35)
GFR, EST AFRICAN AMERICAN: 66 mL/min — AB (ref >90)
GFR, EST NON AFRICAN AMERICAN: 57 mL/min — AB (ref >90)
Glucose, Bld: 97 mg/dL (ref 70–99)
Potassium: 4.2 mEq/L (ref 3.7–5.3)
SODIUM: 140 meq/L (ref 137–147)

## 2013-06-01 MED ORDER — OXYCODONE-ACETAMINOPHEN 5-325 MG PO TABS: 1.0000 | ORAL_TABLET | ORAL | Status: DC | PRN

## 2013-06-01 NOTE — Progress Notes (Signed)
5 Days Post-Op  Subjective: No complaints. Wants to go home  Objective: Vital signs in last 24 hours: Temp:  [98 F (36.7 C)-98.5 F (36.9 C)] 98 F (36.7 C) (05/09 0615) Pulse Rate:  [60-64] 60 (05/09 0615) Resp:  [18] 18 (05/09 0615) BP: (102-111)/(62-70) 102/65 mmHg (05/09 0615) SpO2:  [94 %-100 %] 94 % (05/09 0615) Last BM Date: 05/30/13  Intake/Output from previous day: 05/08 0701 - 05/09 0700 In: 2367.5 [P.O.:1680; I.V.:687.5] Out: -  Intake/Output this shift:    Resp: clear to auscultation bilaterally Cardio: regular rate and rhythm GI: soft, nontender. incision looks good. + bm's  Lab Results:   Recent Labs  05/31/13 0423 06/01/13 0518  WBC 7.0 6.5  HGB 10.5* 10.5*  HCT 32.0* 31.5*  PLT 209 231   BMET  Recent Labs  05/31/13 0423 06/01/13 0518  NA 143 140  K 3.9 4.2  CL 108 106  CO2 25 25  GLUCOSE 102* 97  BUN 15 20  CREATININE 1.32 1.37*  CALCIUM 8.5 8.6   PT/INR No results found for this basename: LABPROT, INR,  in the last 72 hours ABG No results found for this basename: PHART, PCO2, PO2, HCO3,  in the last 72 hours  Studies/Results: No results found.  Anti-infectives: Anti-infectives   Start     Dose/Rate Route Frequency Ordered Stop   05/27/13 0929  neomycin (MYCIFRADIN) tablet 1,000 mg  Status:  Discontinued     1,000 mg Oral 3 times per day 05/27/13 0929 05/27/13 0948   05/27/13 0929  erythromycin (E-MYCIN) tablet 1,000 mg  Status:  Discontinued     1,000 mg Oral 3 times per day 05/27/13 0929 05/27/13 0948   05/26/13 1931  clindamycin (CLEOCIN) IVPB 900 mg     900 mg 100 mL/hr over 30 Minutes Intravenous 60 min pre-op 05/26/13 1931 05/27/13 1220   05/26/13 1931  gentamicin (GARAMYCIN) 350 mg in dextrose 5 % 100 mL IVPB     5 mg/kg  70.3 kg 108.8 mL/hr over 60 Minutes Intravenous 60 min pre-op 05/26/13 1931 05/27/13 1239      Assessment/Plan: s/p Procedure(s): ILEOSTOMY TAKEDOWN WITH ILEOPROCTOSTOMY (N/A) Discharge  LOS: 5  days    Connor Small 06/01/2013

## 2013-06-01 NOTE — Plan of Care (Signed)
Problem: Discharge Progression Outcomes Goal: Staples/sutures removed Outcome: Not Met (add Reason) Will be done on first post op visit

## 2013-06-03 ENCOUNTER — Telehealth (INDEPENDENT_AMBULATORY_CARE_PROVIDER_SITE_OTHER): Payer: Self-pay | Admitting: *Deleted

## 2013-06-03 ENCOUNTER — Telehealth (INDEPENDENT_AMBULATORY_CARE_PROVIDER_SITE_OTHER): Payer: Self-pay

## 2013-06-03 ENCOUNTER — Encounter (INDEPENDENT_AMBULATORY_CARE_PROVIDER_SITE_OTHER): Payer: Self-pay | Admitting: Internal Medicine

## 2013-06-03 ENCOUNTER — Encounter (INDEPENDENT_AMBULATORY_CARE_PROVIDER_SITE_OTHER): Payer: Self-pay | Admitting: General Surgery

## 2013-06-03 NOTE — Telephone Encounter (Signed)
Pt calling for appt to have staples removed at end of week. I do not see D/C notes. Not notation of order from Dr Excell Seltzer or PA. Pt advised I will send this msg to [facility] to get order from Dr Excell Seltzer for either nurse only or MD visit depending on what Dr Excell Seltzer may have wanted. Pt can be reached at [phone].

## 2013-06-03 NOTE — Telephone Encounter (Signed)
Dr. Laural Golden, Connor Small's take-down surgery went well. He was released from the hospital Saturday. Upon his discharge, we were instructed to contact you concerning whether or not Connor Small should continue taking the daily Canasa suppository. Thank you for your continued care, Zenia Resides & France Ravens

## 2013-06-03 NOTE — Telephone Encounter (Signed)
Nurse visit for staple removal end of this week OK

## 2013-06-04 ENCOUNTER — Encounter (INDEPENDENT_AMBULATORY_CARE_PROVIDER_SITE_OTHER): Payer: Self-pay | Admitting: General Surgery

## 2013-06-05 ENCOUNTER — Encounter (INDEPENDENT_AMBULATORY_CARE_PROVIDER_SITE_OTHER): Payer: Self-pay | Admitting: Internal Medicine

## 2013-06-05 NOTE — Telephone Encounter (Signed)
He can stop the Canasa suppositories but he needs to go back on Asacol or Delsicol 800 mg twice daily. Office visit in 3 months unless one already planned

## 2013-06-05 NOTE — Discharge Summary (Signed)
Patient ID: Connor Small [id number] 53 y.o. 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 He had a long stormy ICU course including wound dehiscence with partial evisceration. He has made a remarkable recovery. He is back at work. Recent GI evaluation has been essentially unremarkable with some improving diversion proctitis. After an extensive preoperative discussion and evaluation detailed elsewhere he is electively admitted for takedown of his ileostomy.  Hospital Course: on the morning of admission the patient underwent takedown of his ileostomy with stapled ilio proctostomy about 14 cm of rectum remaining. He tolerated the procedure well. There were no postoperative complications. By the third postoperative day he was having frequent loose stools but these were manageable. His diet was able to be advanced to a regular diet. His wound was healing primarily without evidence of infection. Vital signs were stable her lab work unremarkable.    Disposition: Home  Patient Instructions:    Medication List         aspirin 81 MG chewable tablet  Chew 81 mg by mouth daily.     fluticasone 50 MCG/ACT nasal spray  Commonly known as:  FLONASE  Place 1 spray into both nostrils daily.     mesalamine 1000 MG suppository  Commonly known as:  CANASA  Place 1 suppository (1,000 mg total) rectally at bedtime.     multivitamin Liqd  Take 5 mLs by mouth daily.     oxyCODONE-acetaminophen 5-325 MG per tablet   Commonly known as:  ROXICET  Take 1-2 tablets by mouth every 4 (four) hours as needed for severe pain.     saccharomyces boulardii 250 MG capsule  Commonly known as:  FLORASTOR  Take 250 mg by mouth 2 (two) times daily.        Activity: no heavy lifting for 4 weeks Diet: regular diet Wound Care: none needed  Follow-up:  With Dr. Johna Sheriff in 1 week.  Signed: Mariella Saa MD, FACS  06/05/2013, 1:14 PM

## 2013-06-05 NOTE — Telephone Encounter (Signed)
I have talked with both Connor Small and [place]. We have Delzicol 400 mg samples, per Dr.Rehman will give those to patient. He will take 800 mg twice a day. His office appointment is 07/29/13.

## 2013-06-06 ENCOUNTER — Ambulatory Visit (INDEPENDENT_AMBULATORY_CARE_PROVIDER_SITE_OTHER): Payer: BC Managed Care – PPO

## 2013-06-06 DIAGNOSIS — Z4802 Encounter for removal of sutures: Secondary | ICD-10-CM

## 2013-06-07 NOTE — Progress Notes (Signed)
Pt is s/p ileostomy takedown on 05/27/13, and was here yesterday to have his staples removed.  Pt is still having some pain and is apprehensive about  visit.  Staples were removed from abdominal incision and from ileostomy site.  He has a swollen area at the ileostomy site which is most likely a hematoma.  Explained to the patient this is not unusual and it should dissolve in time.  It is not painful.  Benzoin and steri strips placed.  Pt tolerated procedure fairly well.  Post op appointment with Dr. Excell Seltzer was made.

## 2013-06-08 IMAGING — CR DG CHEST 1V PORT
1 series · 1 of 1 positions shown · non-contrast
Comparison: 03/31/2012

CLINICAL DATA: Congestion and productive cough.

PORTABLE CHEST - 1 VIEW

[AP]
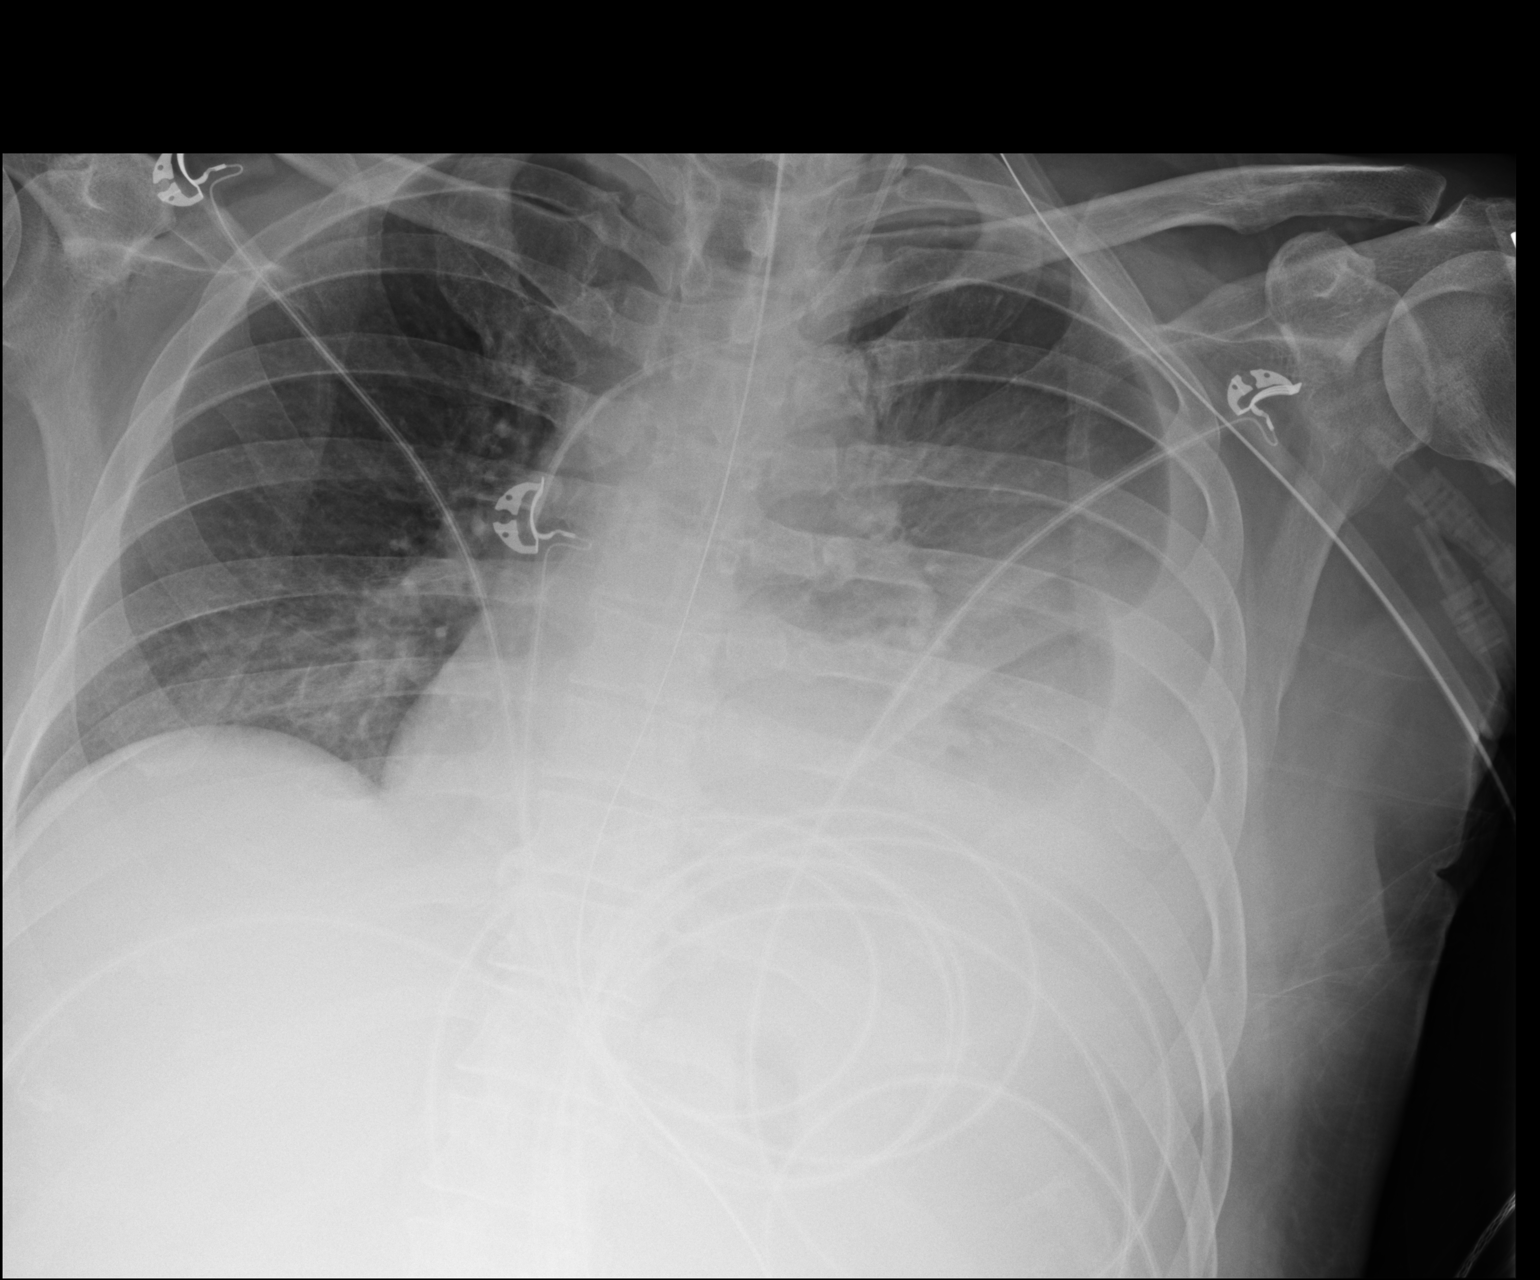

[1 of 1 positions shown; findings below may reference images not displayed]

FINDINGS: Enteric tube and left central venous catheter appear
unchanged in position.  There is persistent left pleural effusion
with atelectasis or infiltration in the left mid and lower lungs.
Changes are similar to previous study, allowing for differences in
technique and patient positioning.  Right lung remains clear and
expanded.  No pneumothorax.  Heart size is partially obscured by
the left-sided process but appears normal.
IMPRESSION: No significant change since previous study.  Persistent left
pleural effusion and basilar infiltration or atelectasis on the
left.

## 2013-06-10 ENCOUNTER — Encounter (INDEPENDENT_AMBULATORY_CARE_PROVIDER_SITE_OTHER): Payer: Self-pay | Admitting: General Surgery

## 2013-06-10 IMAGING — CT CT CERVICAL SPINE W/O CM
4 of 5 series · 9 of 20 positions shown, 10 images · non-contrast
Comparison: MRI brain 03/22/2012.  CT head 03/11/2012.

CLINICAL DATA: Diffuse neck pain after fall.

CT CERVICAL SPINE WITHOUT CONTRAST
TECHNIQUE: Multidetector CT imaging of the cervical spine was
performed. Multiplanar CT image reconstructions were also
generated.

[Series 2: cervical spine · axial · 0.38mm/px · z∈[-238,-171]mm · 2 of 81 slices shown, 3 images]
[im 27/81  soft-tissue]
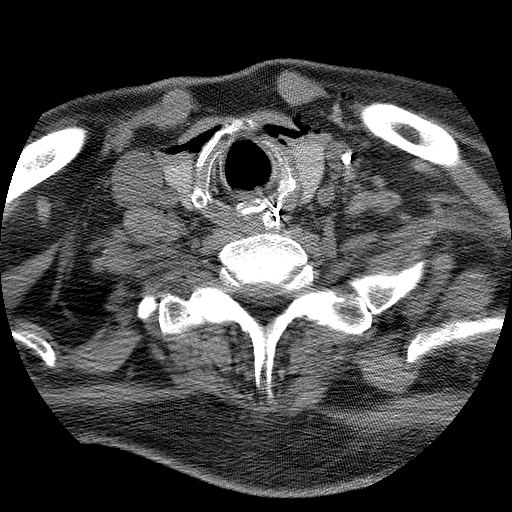
[im 27/81  bone]
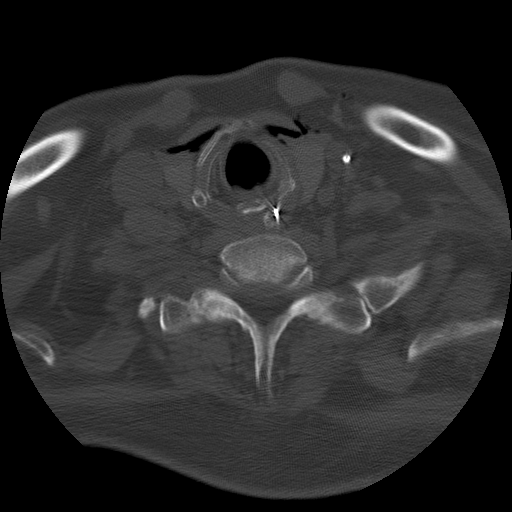
[im 54/81  bone]
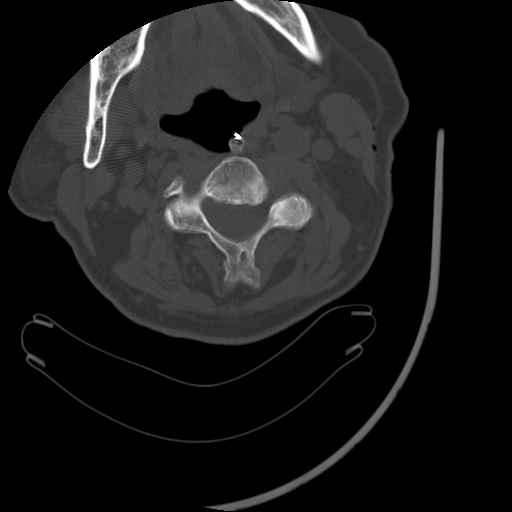

[Series 3: recon 2: cervical spine · axial · 0.38mm/px · z∈[-238,-171]mm · 2 of 81 slices shown]
[im 27/81  bone]
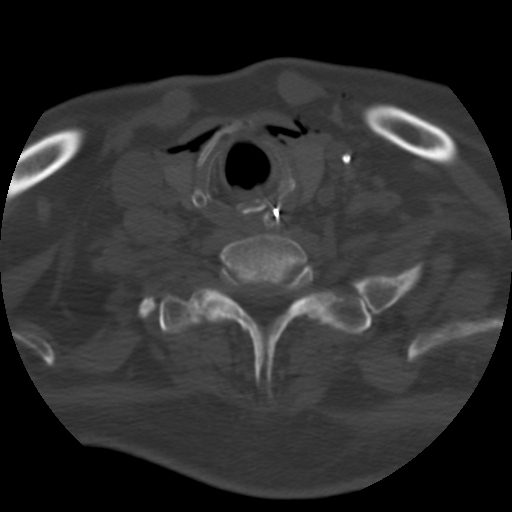
[im 54/81  bone]
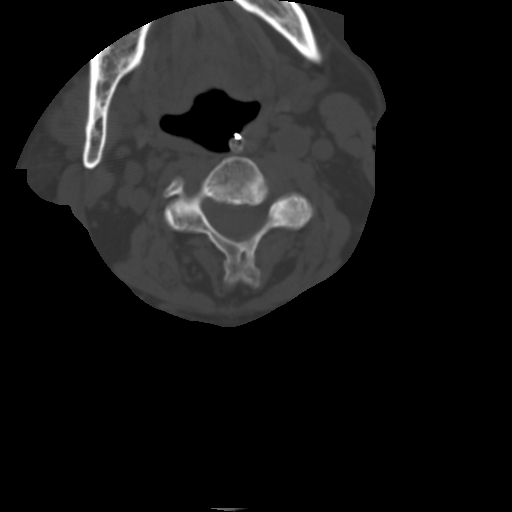

[Series 400: cor · coronal · 0.40mm/px · 3 of 45 slices shown]
[im 9/45  bone]
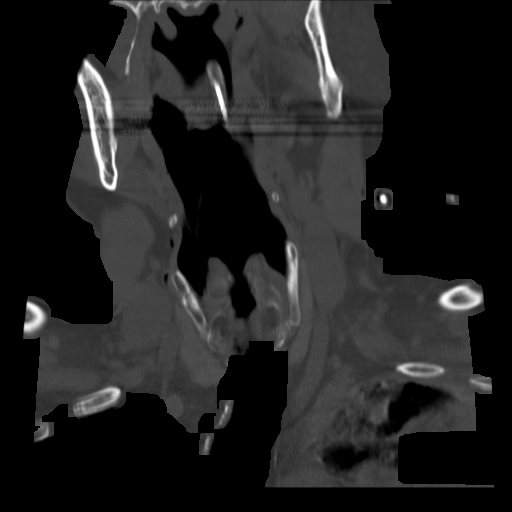
[im 18/45  bone]
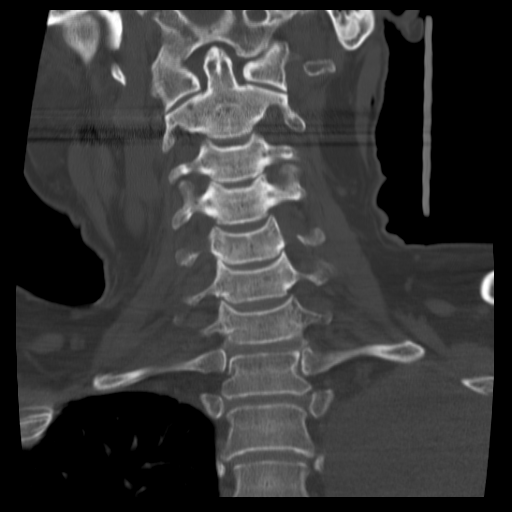
[im 27/45  bone]
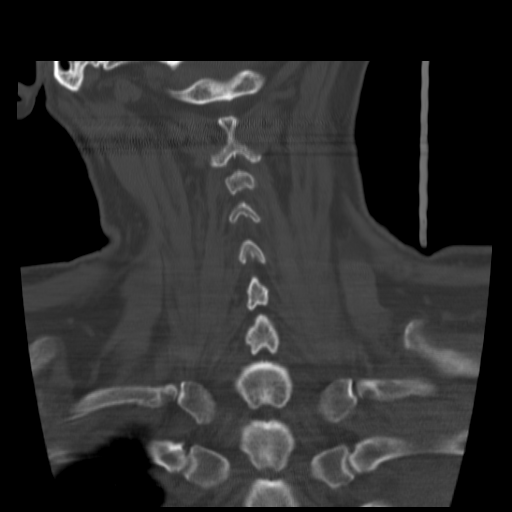

[Series 402: ax · axial · 0.40mm/px · z∈[-253,-193]mm · 2 of 66 slices shown]
[im 22/66  bone]
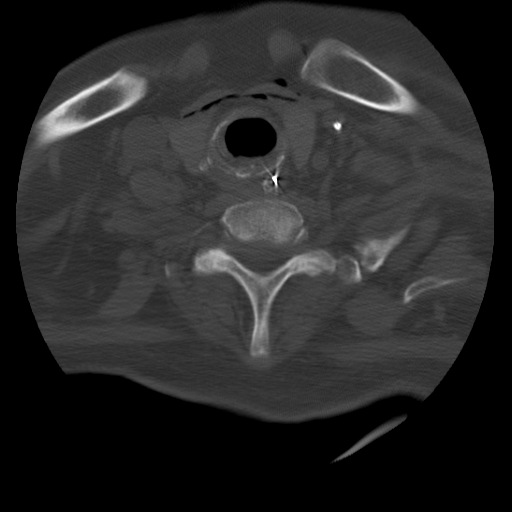
[im 44/66  bone]
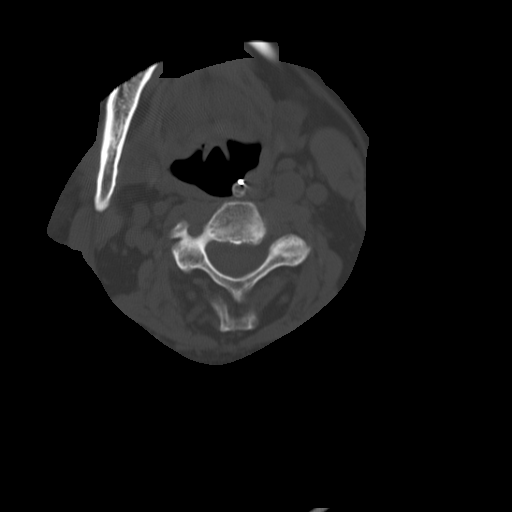

[9 of 20 positions shown; findings below may reference images not displayed]

FINDINGS: Mild degenerative changes in the cervical spine with
narrowed interspaces at C3-4, C4-5, C5-6, and C6-7 levels.  There
are associated endplate hypertrophic changes.  Mild reversal of the
usual cervical lordosis which is likely due to the degenerative
changes.  Degenerative changes in the facet joints.  No vertebral
compression deformities.  No prevertebral soft tissue swelling.  No
focal bone lesion or bone destruction.  There is subcutaneous
emphysema predominately in the left neck involving the
sternocleidomastoid muscles and extending across the midline over
the laryngeal region.  There is some dissection around the jugular
vein.  There is a left central venous catheter in place in this
emphysema may be related catheter insertion.  The visualized lung
apices are limited due to motion artifact but there is no obvious
pneumothorax.  There is a large left pleural effusion.
IMPRESSION: Degenerative changes in the cervical spine.  No displaced fractures
are identified.  Subcutaneous emphysema in the left neck which may
be related to a central venous catheter insertion.  A large left
pleural effusion is identified.

## 2013-06-11 ENCOUNTER — Telehealth (INDEPENDENT_AMBULATORY_CARE_PROVIDER_SITE_OTHER): Payer: Self-pay

## 2013-06-11 NOTE — Telephone Encounter (Signed)
Called and left message for patient regarding disability forms.  Patient emailed out office stating disability has been suspended due to lack of diagnosis information.  I have the forms and the diagnosis section is complete.  Unsure of the area on the forms they are concerned about.

## 2013-06-19 ENCOUNTER — Encounter (INDEPENDENT_AMBULATORY_CARE_PROVIDER_SITE_OTHER): Payer: Self-pay | Admitting: General Surgery

## 2013-06-19 ENCOUNTER — Ambulatory Visit (INDEPENDENT_AMBULATORY_CARE_PROVIDER_SITE_OTHER): Payer: BC Managed Care – PPO | Admitting: General Surgery

## 2013-06-19 VITALS — BP 124/70 | HR 76 | Temp 97.5°F | Ht 68.0 in | Wt 156.0 lb

## 2013-06-19 DIAGNOSIS — Z09 Encounter for follow-up examination after completed treatment for conditions other than malignant neoplasm: Secondary | ICD-10-CM

## 2013-06-19 NOTE — Progress Notes (Signed)
Chief complaint: Followup takedown ileostomy  History: The patient returns to the office in 3 weeks following takedown of his ileostomy. He has gotten along quite well. He has had frequent loose stools as expected but actually has been tolerable and less that I feared. He is having about 8 bowel movements a day on a good day and up to 12 on a bad day. He has not really been taking any Imodium wanting to see what his body would do on its own. He has no bowel pain or fever. Eating well. Getting back to routine activities.  Exam: BP 124/70  Pulse 76  Temp(Src) 97.5 F (36.4 C)  Ht 5' 8"  (1.727 m)  Wt 156 lb (70.761 kg)  BMI 23.73 kg/m2 General: Appears well Abdomen: Midline and ileostomy incisions are well healed. Soft and nontender.  Assessment and plan: Doing well following surgery as above. Planning to return to work in about 3 weeks which I think is reasonable. 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 Laural Golden  Will see him back in one month.Marland Kitchen

## 2013-07-29 ENCOUNTER — Ambulatory Visit (INDEPENDENT_AMBULATORY_CARE_PROVIDER_SITE_OTHER): Payer: BC Managed Care – PPO | Admitting: Internal Medicine

## 2013-07-29 ENCOUNTER — Encounter (INDEPENDENT_AMBULATORY_CARE_PROVIDER_SITE_OTHER): Payer: Self-pay | Admitting: Internal Medicine

## 2013-07-29 VITALS — BP 108/70 | HR 66 | Temp 98.8°F | Resp 18 | Ht 70.0 in | Wt 154.9 lb

## 2013-07-29 DIAGNOSIS — R197 Diarrhea, unspecified: Secondary | ICD-10-CM

## 2013-07-29 DIAGNOSIS — Z8719 Personal history of other diseases of the digestive system: Secondary | ICD-10-CM

## 2013-07-29 MED ORDER — MESALAMINE ER 500 MG PO CPCR: 1000.0000 mg | ORAL_CAPSULE | Freq: Two times a day (BID) | ORAL | Status: DC

## 2013-07-29 MED ORDER — LOPERAMIDE HCL 2 MG PO CAPS: 2.0000 mg | ORAL_CAPSULE | Freq: Three times a day (TID) | ORAL | Status: DC

## 2013-07-29 NOTE — Progress Notes (Signed)
Presenting complaint;  History of ulcerative colitis. Diarrhea since takedown of ileostomy.  Subjective:  Connor Small is 53 year old Caucasian male who presents for scheduled visit accompanied by [name]. He underwent takedown of ileostomy on 05/27/2013 by Dr. Adonis Housekeeper and had uneventful course. He has 11 year history of ulcerative colitis he underwent subtotal colectomy in February last year for fulminant C. difficile colitis. 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 He has returned to work. He's been averaging 6-8 stools during daytime and 2 at night. He has not experienced any accidents. At times he has abdominal cramping but is self limiting. He developed cellulitis possibly from insect bite and was on doxycycline until 07/17/2013. While on this antibiotic he was having as many as 15 stools per day. He did not experience fever chills nausea or vomiting. He has occasional hematochezia felt to be secondary to hemorrhoids. His appetite is good. His presurgery weight was 160 pounds and today is 159 pounds.   Current Medications: Outpatient Encounter Prescriptions as of 07/29/2013  Medication Sig  . aspirin 81 MG chewable tablet Chew 81 mg by mouth daily.  . fluticasone (FLONASE) 50 MCG/ACT nasal spray Place 1 spray into both nostrils daily.   . mesalamine (PENTASA) 500 MG CR capsule Take 500 mg by mouth. Patient takes 2 by mouth twice daily  . Multiple Vitamin (MULTIVITAMIN) LIQD Take 5 mLs by mouth daily.  Marland Kitchen nystatin-triamcinolone ointment (MYCOLOG) Apply 1 application topically as needed.   . saccharomyces boulardii (FLORASTOR) 250 MG capsule Take 250 mg by mouth 2 (two) times daily.  . [DISCONTINUED] mesalamine (CANASA) 1000 MG suppository Place 1 suppository (1,000 mg total) rectally at bedtime.     Objective: Blood pressure 108/70, pulse 66, temperature 98.8 F (37.1 C), temperature source Oral, resp.  rate 18, height 5' 10"  (1.778 m), weight 154 lb 14.4 oz (70.262 kg). Patient is alert and in no acute distress. Conjunctiva is pink. Sclera is nonicteric Oropharyngeal mucosa is normal. No neck masses or thyromegaly noted. Cardiac exam with regular rhythm normal S1 and S2. No murmur or gallop noted. Lungs are clear to auscultation. Abdomen abdomen is symmetrical. He has midline scar as well as colostomy and ileostomy scars. Bowel sounds are normal. Abdomen is soft and nontender without organomegaly or masses. He is to small areas of induration along the scar line possibly related to suture. No LE edema or clubbing noted. He remains with week right grip and unable to fully flex his fingers.   Labs/studies Results: Lab data from 06/01/2013 WC 6.5, H&H 10.5 and 31.5 and platelet count 231K. Serum sodium 140, potassium 4.2, chloride 106, CO2 25, BUN 20, creatinine 1.37 and glucose 97.  Serum calcium 8.6.      Assessment:  #1. History of ulcerative colitis. While most of patient's colon has been removed(sigmoid colon in October 2012 for etiopathic perforation and rest of the colon in February 2014 for fulminant C. difficile colitis) he still has rectum and therefore needs to be on mesalamine. He possibly would do well with mesalamine suppository but would prefer oral mesalamine. Only medication that would make sense would be Pentasa causes bioavailable and small bowel. He is on suboptimal dose. #2. Diarrhea. It is secondary to the fact that he does not have any colon. He should gradually adapt and hopefully frequency but dropped to 3-4 bowel movements per day. While adaptation is taking place we'll treat him with loperamide and will add Questran at  a later date if necessary. I do not feel there is any need for stool studies at this time. #3. Mildly elevated serum creatinine. History of urolithiasis. #4. Anemia. Recent drop possibly related to surgery.   Plan:  Increase then passed to 1 g  by mouth twice a day. Loperamide 2 mg by mouth 3 times a day. Office visit in 8 weeks. CBC and metabolic 7 at the time of next office visit.

## 2013-07-29 NOTE — Patient Instructions (Signed)
Take loperamide or Imodium on schedule rather than on an as-needed basis.

## 2013-08-02 ENCOUNTER — Encounter (INDEPENDENT_AMBULATORY_CARE_PROVIDER_SITE_OTHER): Payer: Self-pay | Admitting: General Surgery

## 2013-08-02 ENCOUNTER — Ambulatory Visit (INDEPENDENT_AMBULATORY_CARE_PROVIDER_SITE_OTHER): Payer: BC Managed Care – PPO | Admitting: General Surgery

## 2013-08-02 VITALS — BP 108/70 | HR 71 | Ht 68.0 in | Wt 155.0 lb

## 2013-08-02 DIAGNOSIS — Z09 Encounter for follow-up examination after completed treatment for conditions other than malignant neoplasm: Secondary | ICD-10-CM

## 2013-08-02 NOTE — Progress Notes (Signed)
History: Patient returns for routine followup now 8 weeks following takedown of his ileostomy with ileo-proctostomy. He generally has been getting along well. He did however require some oral antibiotics for an insect bite with cellulitis and this made his diarrhea worse. He was having up to 15-20 stools a day. This is now better off of antibiotics at about 12 per day. This is somewhat of an aggravation for him as he is not able to sleep all night and has difficulty going out to and doing much out of the house. He is back at work. I had suggested him Imodium at his last visit but he felt like he wanted to wait and see what he could do on his own. Dr. Laural Golden has encouraged him to try the Imodium and he has a prescription which she is just starting today. No fever or abdominal pain. He feels a little lump under his incision, possibly a suture  Exam: BP 108/70  Pulse 71  Ht 5' 8"  (1.727 m)  Wt 155 lb (70.308 kg)  BMI 23.57 kg/m2 General: Appears well Abdomen: Soft and nontender. Well-healed without hernias. There is a knot of suture in the central portion of the abdomen it is noninflamed.  Assessment and plan: generally doing very well following above procedure. I told him I think the Imodium will help a lot and as the months ago by he will have less tendency toward diarrhea. He also had diversion proctitis which should gradually clear up. We will get him back for more long-term followup in about 4 months.

## 2013-11-04 ENCOUNTER — Encounter (INDEPENDENT_AMBULATORY_CARE_PROVIDER_SITE_OTHER): Payer: Self-pay | Admitting: Internal Medicine

## 2013-11-04 ENCOUNTER — Ambulatory Visit (INDEPENDENT_AMBULATORY_CARE_PROVIDER_SITE_OTHER): Payer: BC Managed Care – PPO | Admitting: Internal Medicine

## 2013-11-04 VITALS — BP 104/68 | HR 64 | Temp 97.8°F | Resp 18 | Ht 70.0 in | Wt 155.0 lb

## 2013-11-04 DIAGNOSIS — Z8719 Personal history of other diseases of the digestive system: Secondary | ICD-10-CM

## 2013-11-04 DIAGNOSIS — R197 Diarrhea, unspecified: Secondary | ICD-10-CM

## 2013-11-04 MED ORDER — MESALAMINE ER 500 MG PO CPCR: 1000.0000 mg | ORAL_CAPSULE | Freq: Two times a day (BID) | ORAL | Status: DC

## 2013-11-04 MED ORDER — DIPHENOXYLATE-ATROPINE 2.5-0.025 MG PO TABS: 1.0000 | ORAL_TABLET | Freq: Four times a day (QID) | ORAL | Status: DC | PRN

## 2013-11-04 NOTE — Progress Notes (Signed)
Presenting complaint;  Persistent diarrhea since takedown of ileostomy. History of ulcerative colitis.  Database;  Patient has 12 year history of ulcerative colitis. He presented with acute abdomen in October 2012 secondary to sigmoid colon perforation treated with resection and colostomy which was subsequently taken down in the early part of 2013. He presented with acute onset of diarrhea in February last year secondary to fulminant C. difficile colitis resulting in microcolon and subtotal colectomy. He had multiple complications and prolonged hospitalization. He slowly recovered and was able to return to work. He has residual weakness in his right hand. He had takedown of ileostomy on 05/27/2013. Ever since he's been having multiple bowel movements. He was last seen 3 months ago and advised to use loperamide.  Subjective; Patient states he tried Imodium. He did not slow down in frequency of bowel movements but caused him to feel bloated and heaviness in lower abdomen and he therefore stopped taking this medication. He is having an average of 9 stools every 24 hours. Of these he has a 1-2 every night. Most of his stools or loose or mushy but every now and then he may have a formed stool. He denies melena or frank rectal bleeding. He has blood on tissue every now and then felt to be secondary to hemorrhoids. He also has frequent gas in necessitating trip to the bathroom. His appetite is good and his weight has been stable. He is also noted irritation and eschar from 2 sutures. He is planning to have them removed if discomfort continues. He also needs new prescription for Pentasa.    Current Medications: Outpatient Encounter Prescriptions as of 11/04/2013  Medication Sig  . aspirin 81 MG chewable tablet Chew 81 mg by mouth daily.  . fluticasone (FLONASE) 50 MCG/ACT nasal spray Place 1 spray into both nostrils daily.   . mesalamine (PENTASA) 500 MG CR capsule Take 2 capsules (1,000 mg  total) by mouth 2 (two) times daily. Patient takes 2 by mouth twice daily  . Multiple Vitamin (MULTIVITAMIN) LIQD Take 5 mLs by mouth daily.  Marland Kitchen nystatin-triamcinolone ointment (MYCOLOG) Apply 1 application topically as needed.   . saccharomyces boulardii (FLORASTOR) 250 MG capsule Take 250 mg by mouth 2 (two) times daily.  Marland Kitchen loperamide (IMODIUM) 2 MG capsule Take 1 capsule (2 mg total) by mouth 3 (three) times daily before meals.     Objective: Blood pressure 104/68, pulse 64, temperature 97.8 F (36.6 C), temperature source Oral, resp. rate 18, height 5' 10"  (1.778 m), weight 155 lb (70.308 kg). Patient is alert and in no acute distress. Conjunctiva is pink. Sclera is nonicteric Oropharyngeal mucosa is normal. No neck masses or thyromegaly noted. Cardiac exam with regular rhythm normal S1 and S2. No murmur or gallop noted. Lungs are clear to auscultation. Abdomen is flat. They're to easily palpable sutures involving midline scar. Abdomen is soft and nontender. No organomegaly or masses.  No LE edema or clubbing noted. He remains with weak right grip.    Assessment:  #1. Diarrhea secondary to loss of colon. Frequency of bowel movements should gradually slowed down but until that happens it would be reasonable to treat him with low dose diphenoxylate if he has no side effects. He is having frequent gas pain. Therefore we'll make sure that he does not have anastomotic stricture which she developed after he had sigmoid colon resection. #2. History of distal ulcerative colitis. He remains with rectum and therefore needs to continue mesalamine.   Plan:  Diphenoxylate 1  tablet by mouth 4 times a day when necessary. Prescription given for 120 doses. New prescription given for Pentasa 1 g by mouth twice a day Three month supply with refills given. Diagnostic flexible sigmoidoscopy later this month. Office visit in 6 months.

## 2013-11-04 NOTE — Patient Instructions (Signed)
Flexible sigmoidoscopy to be scheduled.

## 2013-11-15 ENCOUNTER — Other Ambulatory Visit (INDEPENDENT_AMBULATORY_CARE_PROVIDER_SITE_OTHER): Payer: Self-pay | Admitting: *Deleted

## 2013-11-15 ENCOUNTER — Telehealth (INDEPENDENT_AMBULATORY_CARE_PROVIDER_SITE_OTHER): Payer: Self-pay | Admitting: *Deleted

## 2013-11-15 ENCOUNTER — Encounter (INDEPENDENT_AMBULATORY_CARE_PROVIDER_SITE_OTHER): Payer: Self-pay | Admitting: *Deleted

## 2013-11-15 DIAGNOSIS — K51219 Ulcerative (chronic) proctitis with unspecified complications: Secondary | ICD-10-CM

## 2013-11-15 DIAGNOSIS — R197 Diarrhea, unspecified: Secondary | ICD-10-CM

## 2013-11-15 NOTE — Telephone Encounter (Signed)
FS sch'd 12/11/13 @ 100, how do you him to prep

## 2013-11-18 NOTE — Telephone Encounter (Signed)
Left detailed message advising patient

## 2013-11-18 NOTE — Telephone Encounter (Signed)
1 fleets enema on day of procedure

## 2013-11-21 ENCOUNTER — Encounter: Payer: Self-pay | Admitting: Cardiology

## 2013-11-21 ENCOUNTER — Ambulatory Visit (INDEPENDENT_AMBULATORY_CARE_PROVIDER_SITE_OTHER): Payer: BC Managed Care – PPO | Admitting: Cardiology

## 2013-11-21 VITALS — BP 106/66 | HR 75 | Ht 69.0 in | Wt 158.0 lb

## 2013-11-21 DIAGNOSIS — I480 Paroxysmal atrial fibrillation: Secondary | ICD-10-CM

## 2013-11-21 DIAGNOSIS — R002 Palpitations: Secondary | ICD-10-CM | POA: Insufficient documentation

## 2013-11-21 NOTE — Patient Instructions (Signed)
Your physician has recommended that you wear an event monitor for 7 days. Event monitors are medical devices that record the heart's electrical activity. Doctors most often [place] these monitors to diagnose arrhythmias. Arrhythmias are problems with the speed or rhythm of the heartbeat. The monitor is a small, portable device. You can wear one while you do your normal daily activities. This is usually used to diagnose what is causing palpitations/syncope (passing out).      Your physician recommends that you continue on your current medications as directed. Please refer to the Current Medication list given to you today.   We will call you with results   Thank you for choosing [facility] !

## 2013-11-21 NOTE — Progress Notes (Signed)
Reason for visit: Palpitations  Clinical Summary Connor Small is a 53 y.o.male referred for cardiology consultation by Dr. Melford Aase. He has been experiencing brief episodes of palpitations. He describes a feeling as if he has a few extra beats followed by a pause, no prolonged rapid palpitations, no associated breathlessness or chest pain. He notices this perhaps 2 or 3 times a week recently. No syncope. ECG today shows normal sinus rhythm.  I reviewed extensive records. Patient had a complicated hospital stay back in March 2014 status post total colectomy for toxic medical and and C. difficile colitis. He was critically ill with septic shock, acute renal failure, respiratory failure, thrombocytopenia, liver abnormalities, transient atrial fibrillation, and also a stroke which was felt to be embolic. He was seen by neurology, not evaluated by cardiology. Decision was made not to anticoagulate the patient based on bleeding risk with low platelet count and liver abnormalities. He has been on aspirin, and has had follow-up with Dr. Leonie Man as of July 2014. It was recommended that he continue aspirin at that time.  Labwork from may showed hemoglobin 10.5, platelets 231, potassium 4.2, BUN 20, creatinine 1.3. Last hepatic panel in April showed normal AST and ALT.  Echocardiogram from March 2014 was overall suboptimal image quality, LVEF was estimated at 50-55%.  CHADSVASC score based on most recent information is 2. HAS-BLED score is also 2. His estimated annual risk of ischemic stroke on aspirin would be 2.3% with a major bleeding risk and 1.1%, versus annual risk of ischemic stroke of 0.8% on Eliquis with a major bleeding risk of 2.6%. To this point, he has not had any documented recurrent atrial fibrillation since his event last March.   Allergies  Allergen Reactions  . Penicillins Other (See Comments)    Heart rate changes  . Claritin [Loratadine]     Rash  . Clindamycin/Lincomycin Diarrhea   C-diff  . Morphine And Related     Nausea/vomiting  . Imipenem Rash    Primaxin antibiotic    Current Outpatient Prescriptions  Medication Sig Dispense Refill  . aspirin 81 MG chewable tablet Chew 81 mg by mouth daily.      . diphenoxylate-atropine (LOMOTIL) 2.5-0.025 MG per tablet Take 1 tablet by mouth 4 (four) times daily as needed for diarrhea or loose stools.  120 tablet  0  . fluticasone (FLONASE) 50 MCG/ACT nasal spray Place 1 spray into both nostrils daily.       . mesalamine (PENTASA) 500 MG CR capsule Take 2 capsules (1,000 mg total) by mouth 2 (two) times daily. Patient takes 2 by mouth twice daily  360 capsule  3  . Multiple Vitamin (MULTIVITAMIN) LIQD Take 5 mLs by mouth daily.      Marland Kitchen nystatin-triamcinolone ointment (MYCOLOG) Apply 1 application topically as needed.       . saccharomyces boulardii (FLORASTOR) 250 MG capsule Take 250 mg by mouth 2 (two) times daily.       No current facility-administered medications for this visit.    Past Medical History  Diagnosis Date  . Chronic diarrhea   . Rectal bleed   . Hemorrhoids   . Ulcerative colitis     Distal UC  . Diverticulitis of large intestine with perforation 10/2011    [facility]  . 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 PAF (paroxysmal atrial fibrillation) March 2014  Past Surgical History  Procedure Laterality Date  . Colonoscopy  9/03  . Sigmoidoscopy       06/13/2002  . Temporary ostomy November 06, 2010      for a colon perforation that was done in [facility] (Dr Payton Doughty).    . Colonoscopy  02/16/2011    Procedure: COLONOSCOPY;  Surgeon: Rogene Houston, MD;  [facility];  Service: Endoscopy;  Laterality: N/A;  100  . Flexible sigmoidoscopy  02/27/2012    Procedure: FLEXIBLE SIGMOIDOSCOPY;  Surgeon: Rogene Houston, MD;  [facility];  Service: Endoscopy;  Laterality: N/A;  with colonic  decompression  . Laparotomy  02/27/2012    Procedure: EXPLORATORY LAPAROTOMY;  Surgeon: Donato Heinz, MD;  Location: [facility];  Service: General;  Laterality: N/A;  . Cecostomy  02/27/2012    Procedure: CECOSTOMY;  Surgeon: Donato Heinz, MD;  Location: [facility];  Service: General;  Laterality: N/A;  Cecostomy Tube Placement  . Colectomy  02/28/2012    Procedure: TOTAL COLECTOMY;  Surgeon: Donato Heinz, MD;  Location: [facility];  Service: General;  Laterality: N/A;  . Ileostomy  02/28/2012    Procedure: ILEOSTOMY;  Surgeon: Donato Heinz, MD;  Location: [facility];  Service: General;  Laterality: N/A;  . Cystoscopy w/ ureteral stent placement Bilateral 03/20/2012    Procedure: CYSTOSCOPY WITH RETROGRADE PYELOGRAM/URETERAL STENT PLACEMENT;  Surgeon: Alexis Frock, MD;  Location: [facility];  Service: Urology;  Laterality: Bilateral;  . Laparotomy N/A 03/26/2012    Procedure: EXPLORATORY LAPAROTOMY;  Surgeon: Edward Jolly, MD;  Location: [facility];  Service: General;  Laterality: N/A;  . Application of wound vac N/A 03/26/2012    Procedure: APPLICATION OF WOUND VAC;  Surgeon: Edward Jolly, MD;  Location: [facility];  Service: General;  Laterality: N/A;  . Liver biopsy N/A 03/26/2012    Procedure: LIVER BIOPSY;  Surgeon: Edward Jolly, MD;  Location: [place];  Service: General;  Laterality: N/A;  . Laparotomy N/A 03/29/2012    Procedure: EXPLORATORY LAPAROTOMY, PARTIAL WOUND CLOSURE;  Surgeon: Edward Jolly, MD;  Location: [place];  Service: General;  Laterality: N/A;  . Vacuum assisted closure change N/A 03/29/2012    Procedure: Open ABDOMINAL VACUUM  CHANGE;  Surgeon: Edward Jolly, MD;  Location: [facility];  Service: General;  Laterality: N/A;  . Vacuum assisted closure change N/A 04/01/2012    Procedure: removal of abdominal vac dressing and abdominal closure;  Surgeon: Edward Jolly, MD;  Location: [facility];  Service: General;  Laterality: N/A;  . Flexible sigmoidoscopy N/A 02/06/2013    Procedure:  FLEXIBLE SIGMOIDOSCOPY;  Surgeon: Rogene Houston, MD;  [facility];  Service: Endoscopy;  Laterality: N/A;  100-moved to 1200 Ann notified pt  . Flexible sigmoidoscopy N/A 03/27/2013    Procedure: FLEXIBLE SIGMOIDOSCOPY;  Surgeon: Rogene Houston, MD;  [facility];  Service: Endoscopy;  Laterality: N/A;  225  . Ileostomy closure N/A 05/27/2013    Procedure: ILEOSTOMY TAKEDOWN WITH ILEOPROCTOSTOMY;  Surgeon: Edward Jolly, MD;  Location: [facility];  Service: General;  Laterality: N/A;    Family History  Problem Relation Age of Onset  . Cancer Sister   . Healthy Daughter   . Hypertension Mother     Social History Connor Small reports that he quit smoking about 29 years ago. His smoking use included Cigarettes. He has a 6 pack-year smoking history. He has never used smokeless tobacco. Mr.  Small reports that he does not drink alcohol.  Review of Systems Complete review of systems negative except as otherwise outlined in the clinical summary and also the following. Reports occasional bright red blood when he wipes, history of hemorrhoids. Denies any major GI bleeding. He follows with Dr. Laural Golden. No exertional chest pain or unusual breathlessness.  Physical Examination Filed Vitals:   11/21/13 1419  BP: 106/66  Pulse: 75   Filed Weights   11/21/13 1419  Weight: 158 lb (71.668 kg)   Appears comfortable at rest. HEENT: Conjunctiva and lids normal, oropharynx clear. Neck: Supple, no elevated JVP or carotid bruits, no thyromegaly. Lungs: Clear to auscultation, nonlabored breathing at rest. Cardiac: Regular rate and rhythm, no S3 or significant systolic murmur, no pericardial rub. Abdomen: Soft, nontender, healed surgical scars, bowel sounds present. Extremities: No pitting edema, distal pulses 2+. Skin: Warm and dry. Musculoskeletal: No kyphosis. Neuropsychiatric: Alert and oriented x3, affect grossly appropriate.   Problem List and Plan    Palpitations Recent palpitations as outlined, ECG today is normal. He may be simply experiencing ectopy, hopefully this is not recurrent atrial fibrillation. We will plan a seven-day cardiac monitor to further investigate and call him with the results. No change in current medical regimen.  PAF (paroxysmal atrial fibrillation) Records reviewed. Had atrial fibrillation documented in the setting of critical illness in March of last year, also associated with stroke that was likely cardioembolic. He has been on aspirin. Thromboembolic risk and bleeding risk outlined above, not at all straightforward situation in light of his GI history and prior bleeding. We will see what his monitor shows and if there is definitive atrial fibrillation, I will bring him back to discuss the options.    Satira Sark, M.D., F.A.C.C.

## 2013-11-21 NOTE — Assessment & Plan Note (Signed)
Records reviewed. Had atrial fibrillation documented in the setting of critical illness in March of last year, also associated with stroke that was likely cardioembolic. He has been on aspirin. Thromboembolic risk and bleeding risk outlined above, not at all straightforward situation in light of his GI history and prior bleeding. We will see what his monitor shows and if there is definitive atrial fibrillation, I will bring him back to discuss the options.

## 2013-11-21 NOTE — Assessment & Plan Note (Signed)
Recent palpitations as outlined, ECG today is normal. He may be simply experiencing ectopy, hopefully this is not recurrent atrial fibrillation. We will plan a seven-day cardiac monitor to further investigate and call him with the results. No change in current medical regimen.

## 2013-11-22 DIAGNOSIS — R002 Palpitations: Secondary | ICD-10-CM

## 2013-11-22 DIAGNOSIS — I480 Paroxysmal atrial fibrillation: Secondary | ICD-10-CM

## 2013-12-03 ENCOUNTER — Other Ambulatory Visit: Payer: Self-pay | Admitting: *Deleted

## 2013-12-03 DIAGNOSIS — I480 Paroxysmal atrial fibrillation: Secondary | ICD-10-CM

## 2013-12-03 DIAGNOSIS — R002 Palpitations: Secondary | ICD-10-CM

## 2013-12-03 NOTE — Progress Notes (Signed)
PT made aware of event monitor results, forwarded to DR. Melford Aase

## 2013-12-11 ENCOUNTER — Encounter (HOSPITAL_COMMUNITY): Payer: Self-pay | Admitting: *Deleted

## 2013-12-11 ENCOUNTER — Ambulatory Visit (HOSPITAL_COMMUNITY)
Admission: RE | Admit: 2013-12-11 | Discharge: 2013-12-11 | Disposition: A | Discharge status: Home / Self Care | Payer: BC Managed Care – PPO | Source: Ambulatory Visit | Attending: Internal Medicine | Admitting: Internal Medicine

## 2013-12-11 ENCOUNTER — Encounter (HOSPITAL_COMMUNITY)
Admission: RE | Disposition: A | Discharge status: Home / Self Care | Payer: Self-pay | Source: Ambulatory Visit | Attending: Internal Medicine

## 2013-12-11 DIAGNOSIS — K5120 Ulcerative (chronic) proctitis without complications: Secondary | ICD-10-CM

## 2013-12-11 DIAGNOSIS — I480 Paroxysmal atrial fibrillation: Secondary | ICD-10-CM | POA: Diagnosis not present

## 2013-12-11 DIAGNOSIS — R197 Diarrhea, unspecified: Secondary | ICD-10-CM

## 2013-12-11 DIAGNOSIS — I471 Supraventricular tachycardia: Secondary | ICD-10-CM | POA: Insufficient documentation

## 2013-12-11 DIAGNOSIS — Z888 Allergy status to other drugs, medicaments and biological substances status: Secondary | ICD-10-CM | POA: Insufficient documentation

## 2013-12-11 DIAGNOSIS — Z885 Allergy status to narcotic agent status: Secondary | ICD-10-CM | POA: Insufficient documentation

## 2013-12-11 DIAGNOSIS — K5190 Ulcerative colitis, unspecified, without complications: Secondary | ICD-10-CM | POA: Insufficient documentation

## 2013-12-11 DIAGNOSIS — Z8673 Personal history of transient ischemic attack (TIA), and cerebral infarction without residual deficits: Secondary | ICD-10-CM | POA: Insufficient documentation

## 2013-12-11 DIAGNOSIS — Z7982 Long term (current) use of aspirin: Secondary | ICD-10-CM | POA: Insufficient documentation

## 2013-12-11 DIAGNOSIS — E43 Unspecified severe protein-calorie malnutrition: Secondary | ICD-10-CM | POA: Insufficient documentation

## 2013-12-11 DIAGNOSIS — R152 Fecal urgency: Secondary | ICD-10-CM

## 2013-12-11 DIAGNOSIS — Z880 Allergy status to penicillin: Secondary | ICD-10-CM | POA: Diagnosis not present

## 2013-12-11 DIAGNOSIS — K649 Unspecified hemorrhoids: Secondary | ICD-10-CM | POA: Diagnosis not present

## 2013-12-11 DIAGNOSIS — R29898 Other symptoms and signs involving the musculoskeletal system: Secondary | ICD-10-CM | POA: Insufficient documentation

## 2013-12-11 DIAGNOSIS — K5100 Ulcerative (chronic) pancolitis without complications: Secondary | ICD-10-CM

## 2013-12-11 DIAGNOSIS — R109 Unspecified abdominal pain: Secondary | ICD-10-CM

## 2013-12-11 DIAGNOSIS — K51219 Ulcerative (chronic) proctitis with unspecified complications: Secondary | ICD-10-CM

## 2013-12-11 DIAGNOSIS — N182 Chronic kidney disease, stage 2 (mild): Secondary | ICD-10-CM | POA: Insufficient documentation

## 2013-12-11 DIAGNOSIS — Z87891 Personal history of nicotine dependence: Secondary | ICD-10-CM | POA: Diagnosis not present

## 2013-12-11 DIAGNOSIS — Z881 Allergy status to other antibiotic agents status: Secondary | ICD-10-CM | POA: Insufficient documentation

## 2013-12-11 DIAGNOSIS — K9189 Other postprocedural complications and disorders of digestive system: Secondary | ICD-10-CM

## 2013-12-11 HISTORY — PX: FLEXIBLE SIGMOIDOSCOPY: SHX5431

## 2013-12-11 SURGERY — SIGMOIDOSCOPY, FLEXIBLE
Anesthesia: Moderate Sedation

## 2013-12-11 MED ORDER — SODIUM CHLORIDE 0.9 % IV SOLN
INTRAVENOUS | Status: DC
  Administered 2013-12-11: 13:00:00 via INTRAVENOUS

## 2013-12-11 MED ORDER — MEPERIDINE HCL 25 MG/ML IJ SOLN
INTRAMUSCULAR | Status: DC | PRN
  Administered 2013-12-11 (×2): 25 mg via INTRAVENOUS

## 2013-12-11 MED ORDER — MIDAZOLAM HCL 5 MG/5ML IJ SOLN
INTRAMUSCULAR | Status: AC
  Filled 2013-12-11: qty 10

## 2013-12-11 MED ORDER — MEPERIDINE HCL 50 MG/ML IJ SOLN
INTRAMUSCULAR | Status: AC
  Filled 2013-12-11: qty 1

## 2013-12-11 MED ORDER — STERILE WATER FOR IRRIGATION IR SOLN
Status: DC | PRN
  Administered 2013-12-11: 13:00:00

## 2013-12-11 MED ORDER — MIDAZOLAM HCL 5 MG/5ML IJ SOLN
INTRAMUSCULAR | Status: DC | PRN
  Administered 2013-12-11 (×2): 2 mg via INTRAVENOUS
  Administered 2013-12-11: 1 mg via INTRAVENOUS

## 2013-12-11 MED ORDER — MESALAMINE 1000 MG RE SUPP: 1000.0000 mg | Freq: Every day | RECTAL | Status: DC

## 2013-12-11 NOTE — Discharge Instructions (Signed)
Resume usual medications and diet. Mesalamine  Or Canasa suppositories 1 g per rectum daily at bedtime. No driving for 24 hours. Physician will call with biopsy results  Mesalamine, 5-ASA suppositories What is this medicine? MESALAMINE (me SAL a meen) is used to treat the pain and inflammation caused by ulcerative colitis. This medicine may be used for other purposes; ask your health care provider or pharmacist if you have questions. COMMON BRAND NAME(S): Canasa What should I tell my health care provider before I take this medicine? They need to know if you have any of these conditions: -history of pancreatitis -kidney disease -pyloric stenosis -an unusual or allergic reaction to mesalamine, salicylates like aspirin, other medicines, foods like saturated vegetable oils, dyes, or preservatives -pregnant or trying to get pregnant -breast-feeding How should I use this medicine? This medicine is for rectal use only. Do not take by mouth. Wash your hands before and after use. Take off the wrapping. Try to handle the suppository as little as you can, so that it does not melt before use. A small amount of lubricating gel may be used on the tip of the suppository. Lie on your side with your lower leg straightened out and your upper leg bent forward toward your stomach. Lift upper buttock to expose the rectal area. Apply gentle pressure to insert the suppository completely into the rectum, pointed end first. Hold buttocks together for a few seconds. For best results, use this medicine after you have had a bowel movement, and try to keep in place for 1 to 3 hours or longer. Do not use more often than directed. Take your medicine at regular intervals. Follow the directions on the prescription label. Do not stop taking except on your doctor's advice. Talk to your pediatrician regarding the use of this medicine in children. Special care may be needed. Overdosage: If you think you have taken too much of this  medicine contact a poison control center or emergency room at once. NOTE: This medicine is only for you. Do not share this medicine with others. What if I miss a dose? If you miss a dose, take it as soon as you can. If it is almost time for your next dose, take only that dose. Do not take double or extra doses. What may interact with this medicine? Interactions are not expected. This list may not describe all possible interactions. Give your health care provider a list of all the medicines, herbs, non-prescription drugs, or dietary supplements you use. Also tell them if you smoke, drink alcohol, or use illegal drugs. Some items may interact with your medicine. What should I watch for while using this medicine? Tell your doctor or health care professional if your symptoms do not start to get better after several days. There is usually an improvement in 3 to 21 days. You may need about 6 weeks of treatment to get good results. This medicine can cause stains on things it touches. Therefore keep it away from clothing and other fabrics, flooring, painted surfaces, marble, granite, plastics, and enamel. Be careful to avoid stains. What side effects may I notice from receiving this medicine? Side effects that you should report to your doctor or health care professional as soon as possible: -allergic reactions like skin rash, itching or hives, swelling of the face, lips, or tongue -bloody diarrhea -chest pain -difficulty breathing, wheezing -fever -pain or difficulty passing urine -unusually weak or tired -yellowing of the eyes or skin Side effects that usually do not require  medical attention (report to your doctor or health care professional if they continue or are bothersome): -dizziness -headache -nausea, vomiting -rectal pain -stomach gas -stomach pain or cramps This list may not describe all possible side effects. Call your doctor for medical advice about side effects. You may report side  effects to FDA at 1-800-FDA-1088. Where should I keep my medicine? Keep out of the reach of children. Store below 25 degrees C (77 degrees F). Do not freeze. Keep away from direct heat, light or humidity. Throw away any unused medicine after the expiration date. NOTE: This sheet is a summary. It may not cover all possible information. If you have questions about this medicine, talk to your doctor, pharmacist, or health care provider.  2015, Elsevier/Gold Standard. (2008-01-08 16:55:17) Flexible Sigmoidoscopy, Care After Refer to this sheet in the next few weeks. These instructions provide you with information on caring for yourself after your procedure. Your health care provider may also give you more specific instructions. Your treatment has been planned according to current medical practices, but problems sometimes occur. Call your health care provider if you have any problems or questions after your procedure. WHAT TO EXPECT AFTER THE PROCEDURE After your procedure, it is typical to have the following:   Abdominal cramps.  Bloating.  A small amount of rectal bleeding if you had a biopsy. HOME CARE INSTRUCTIONS  Only take over-the-counter or prescription medicines for pain, fever, or discomfort as directed by your health care provider.  Resume your normal diet and activities as directed by your health care provider. SEEK MEDICAL CARE IF:  You have abdominal pain or cramping that lasts longer than 1 hour after the procedure.  You continue to have small amounts of rectal bleeding after 24 hours.  You have nausea or vomiting.  You feel weak or dizzy. SEEK IMMEDIATE MEDICAL CARE IF:   You have a fever.  You pass large blood clots or see a large amount of blood in the toilet after having a bowel movement. This may also occur 10-14 days after the procedure. It is more likely if you had a biopsy.  You develop abdominal pain that is not relieved with medicine or your abdominal pain  gets worse.  You have nausea or vomiting for more than 24 hours after the procedure. Document Released: 01/15/2013 Document Reviewed: 01/15/2013 [facility] Patient Information 2015 [place], [place]. This information is not intended to replace advice given to you by your health care provider. Make sure you discuss any questions you have with your health care provider.

## 2013-12-11 NOTE — H&P (Signed)
Connor Small is an 53 y.o. male.   Chief Complaint:  Patient is here for flexible sigmoidoscopy. HPI:  Patient is 53 year old Caucasian male with complicated GI history who is undergoing diagnostic flexible sigmoidoscopy. He has over 10 year history of ulcerative colitis. he underwent laparotomy for sigmoid colon perforation in October 2012.colostomy was later taken down. He presented last year with C. 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 He had subtotal colectomy with ileostomy which was taken down earlier this year. He is having 8-9 stools per day. He's also having gas pains. He remains on Pentasa. He is undergoing diagnostic flexible sigmoidoscopy to make sure he does not have anastomotic ulcer or active disease.  Past Medical History  Diagnosis Date  . Chronic diarrhea   . Rectal bleed   . Hemorrhoids   . Ulcerative colitis     Distal UC  . 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 PAF (paroxysmal atrial fibrillation) March 2014   . Perforation bowel 2013    sigmoid colon    Past Surgical History  Procedure Laterality Date  . Colonoscopy  9/03  . Sigmoidoscopy       06/13/2002  . Temporary ostomy November 06, 2010      for a colon perforation that was done in [facility] (Dr Payton Doughty).    . Colonoscopy  02/16/2011    Procedure: COLONOSCOPY;  Surgeon: Rogene Houston, MD;  [facility];  Service: Endoscopy;  Laterality: N/A;  100  . Flexible sigmoidoscopy  02/27/2012    Procedure: FLEXIBLE SIGMOIDOSCOPY;  Surgeon: Rogene Houston, MD;  [facility];  Service: Endoscopy;  Laterality: N/A;  with colonic decompression  . Laparotomy  02/27/2012    Procedure: EXPLORATORY LAPAROTOMY;  Surgeon: Donato Heinz, MD;  Location: [facility];  Service: General;  Laterality: N/A;  . Cecostomy   02/27/2012    Procedure: CECOSTOMY;  Surgeon: Donato Heinz, MD;  Location: [facility];  Service: General;  Laterality: N/A;  Cecostomy Tube Placement  . Colectomy  02/28/2012    Procedure: TOTAL COLECTOMY;  Surgeon: Donato Heinz, MD;  Location: [facility];  Service: General;  Laterality: N/A;  . Ileostomy  02/28/2012    Procedure: ILEOSTOMY;  Surgeon: Donato Heinz, MD;  Location: [facility];  Service: General;  Laterality: N/A;  . Cystoscopy w/ ureteral stent placement Bilateral 03/20/2012    Procedure: CYSTOSCOPY WITH RETROGRADE PYELOGRAM/URETERAL STENT PLACEMENT;  Surgeon: Alexis Frock, MD;  Location: [place];  Service: Urology;  Laterality: Bilateral. Pt reports no stents were placed.  . Laparotomy N/A 03/26/2012    Procedure: EXPLORATORY LAPAROTOMY;  Surgeon: Edward Jolly, MD;  Location: [place];  Service: General;  Laterality: N/A;  . Application of wound vac N/A 03/26/2012    Procedure: APPLICATION OF WOUND VAC;  Surgeon: Edward Jolly, MD;  [facility];  Service: General;  Laterality: N/A;  . Liver biopsy N/A 03/26/2012    Procedure: LIVER BIOPSY;  Surgeon: Edward Jolly, MD;  Location: [place];  Service: General;  Laterality: N/A;  . Laparotomy N/A 03/29/2012    Procedure: EXPLORATORY LAPAROTOMY, PARTIAL WOUND CLOSURE;  Surgeon: Edward Jolly, MD;  Location: [place];  Service: General;  Laterality: N/A;  . Vacuum assisted closure change N/A 03/29/2012    Procedure: Open ABDOMINAL  VACUUM  CHANGE;  Surgeon: Edward Jolly, MD;  Location: [place];  Service: General;  Laterality: N/A;  . Vacuum assisted closure change N/A 04/01/2012    Procedure: removal of abdominal vac dressing and abdominal closure;  Surgeon: Edward Jolly, MD;  Location: [place];  Service: General;  Laterality: N/A;  . Flexible sigmoidoscopy N/A 02/06/2013    Procedure: FLEXIBLE SIGMOIDOSCOPY;  Surgeon: Rogene Houston, MD;  [facility];  Service: Endoscopy;  Laterality: N/A;  100-moved to 1200 Ann  notified pt  . Flexible sigmoidoscopy N/A 03/27/2013    Procedure: FLEXIBLE SIGMOIDOSCOPY;  Surgeon: Rogene Houston, MD;  [facility];  Service: Endoscopy;  Laterality: N/A;  225  . Ileostomy closure N/A 05/27/2013    Procedure: ILEOSTOMY TAKEDOWN WITH ILEOPROCTOSTOMY;  Surgeon: Edward Jolly, MD;  Location: [facility];  Service: General;  Laterality: N/A;    Family History  Problem Relation Age of Onset  . Cancer Sister   . Healthy Daughter   . Hypertension Mother   . Colon cancer Neg Hx    Social History:  reports that he quit smoking about 29 years ago. His smoking use included Cigarettes. He has a 6 pack-year smoking history. He has never used smokeless tobacco. He reports that he does not drink alcohol or use illicit drugs.  Allergies:  Allergies  Allergen Reactions  . Penicillins Other (See Comments)    Heart rate changes  . Claritin [Loratadine]     Rash  . Clindamycin/Lincomycin Diarrhea    C-diff  . Morphine And Related     Nausea/vomiting  . Imipenem Rash    Primaxin antibiotic    Medications Prior to Admission  Medication Sig Dispense Refill  . aspirin 81 MG chewable tablet Chew 81 mg by mouth daily.    . diphenoxylate-atropine (LOMOTIL) 2.5-0.025 MG per tablet Take 1 tablet by mouth 4 (four) times daily as needed for diarrhea or loose stools. 120 tablet 0  . mesalamine (PENTASA) 500 MG CR capsule Take 2 capsules (1,000 mg total) by mouth 2 (two) times daily. Patient takes 2 by mouth twice daily 360 capsule 3  . Multiple Vitamin (MULTIVITAMIN) LIQD Take 5 mLs by mouth daily.    Marland Kitchen saccharomyces boulardii (FLORASTOR) 250 MG capsule Take 250 mg by mouth 2 (two) times daily.    . fluticasone (FLONASE) 50 MCG/ACT nasal spray Place 1 spray into both nostrils daily.     Marland Kitchen nystatin-triamcinolone ointment (MYCOLOG) Apply 1 application topically as needed.       No results found for this or any previous visit (from the past 48 hour(s)). No results  found.  ROS  Blood pressure 117/71, pulse 63, temperature 97.7 F (36.5 C), temperature source Oral, resp. rate 16, SpO2 100 %. Physical Exam  Constitutional: He appears well-developed and well-nourished.  HENT:  Mouth/Throat: Oropharynx is clear and moist.  Eyes: Conjunctivae are normal. No scleral icterus.  Neck: No thyromegaly present.  Cardiovascular: Normal rate, regular rhythm and normal heart sounds.   No murmur heard. Respiratory: Effort normal and breath sounds normal.  GI: Soft. He exhibits no distension. There is no tenderness.  Midline and colostomy and ileostomy scars and single palpable suture and minimal skin tenting.  Musculoskeletal: He exhibits no edema.  Lymphadenopathy:    He has no cervical adenopathy.  Neurological: He is alert.  Skin: Skin is warm and dry.     Assessment/Plan History of ulcerative colitis. Status post colectomy with ileoproctostomy Abdominal pain  and frequent stools. Diagnostic flexible sigmoidoscopy.  Santhiago Collingsworth U 12/11/2013, 1:03 PM

## 2013-12-11 NOTE — Op Note (Signed)
FLEXIBLE SIGMOIDOSCOPY  PROCEDURE REPORT  PATIENT:  Connor Small  MR#:  [id number] Birthdate:  1960/11/06, 53 y.o., male Endoscopist:  Dr. Rogene Houston, MD  Procedure Date: 12/11/2013  Procedure:   Flexible sigmoidoscopy.  Indications: patient is 53 year old Caucasian male with a complicated GI history who is undergoing diagnostic flexible sigmoidoscopy to make sure he does not have active proctitis or ulcerative colitis. Patient requests to be sedated.  Informed Consent:  The procedure and risks were reviewed with the patient and informed consent was obtained.  Medications:  Demerol 50 mg IV Versed 5 mg IV  Description of procedure:  After a digital rectal exam was performed, that colonoscope was advanced from the anus through the rectum and ileorectal anastomosis.pediatric colonoscope was passed barely across the tight anastomosis and distal small bowel mucosa was normal. Findings were noted. I did not attempt to retroflex the scope in the rectum. Suture noted close to anastomosis.  Findings:   Normal mucosa of distal small bowel proximal to anastomosis. Non-critical ileorectal anastomotic stricture. Multiple erosions and small ulcers noted involving the rectal mucosa. Multiple biopsies taken for routine histology.    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 Non-critical ileorectal anastomotic stricture.  Recommendations:  Standard instructions given. Nasus suppositories 1 g per rectum daily at bedtime. I will contact patient with biopsy results and further recommendations.   Misael Mcgaha U  12/11/2013 1:26 PM  CC: Dr. Chesley Noon, MD & Dr. Rayne Du ref. provider found  CC: Dr. Gerald Leitz, MD

## 2013-12-16 ENCOUNTER — Encounter (HOSPITAL_COMMUNITY): Payer: Self-pay | Admitting: Internal Medicine

## 2013-12-17 ENCOUNTER — Encounter (INDEPENDENT_AMBULATORY_CARE_PROVIDER_SITE_OTHER): Payer: Self-pay | Admitting: *Deleted

## 2013-12-23 NOTE — Progress Notes (Signed)
Apt has been scheduled for 03/18/14 with Dr. Laural Golden.

## 2013-12-25 ENCOUNTER — Other Ambulatory Visit (INDEPENDENT_AMBULATORY_CARE_PROVIDER_SITE_OTHER): Payer: Self-pay | Admitting: *Deleted

## 2013-12-25 ENCOUNTER — Encounter (INDEPENDENT_AMBULATORY_CARE_PROVIDER_SITE_OTHER): Payer: Self-pay | Admitting: Internal Medicine

## 2013-12-25 DIAGNOSIS — R197 Diarrhea, unspecified: Secondary | ICD-10-CM

## 2013-12-25 MED ORDER — DIPHENOXYLATE-ATROPINE 2.5-0.025 MG PO TABS: 1.0000 | ORAL_TABLET | Freq: Four times a day (QID) | ORAL | Status: DC | PRN

## 2013-12-25 NOTE — Telephone Encounter (Signed)
Patient sent a patient advice asking for a refill on his Lomotil. This was authorized by Dr.Rehman.

## 2014-03-18 ENCOUNTER — Ambulatory Visit (INDEPENDENT_AMBULATORY_CARE_PROVIDER_SITE_OTHER): Payer: BLUE CROSS/BLUE SHIELD | Admitting: Internal Medicine

## 2014-03-18 ENCOUNTER — Encounter (INDEPENDENT_AMBULATORY_CARE_PROVIDER_SITE_OTHER): Payer: Self-pay | Admitting: Internal Medicine

## 2014-03-18 VITALS — BP 118/72 | HR 68 | Temp 97.4°F | Resp 18 | Ht 70.0 in | Wt 161.8 lb

## 2014-03-18 DIAGNOSIS — Z8719 Personal history of other diseases of the digestive system: Secondary | ICD-10-CM

## 2014-03-18 DIAGNOSIS — R197 Diarrhea, unspecified: Secondary | ICD-10-CM

## 2014-03-18 DIAGNOSIS — R21 Rash and other nonspecific skin eruption: Secondary | ICD-10-CM

## 2014-03-18 MED ORDER — DIPHENOXYLATE-ATROPINE 2.5-0.025 MG PO TABS: 1.0000 | ORAL_TABLET | Freq: Four times a day (QID) | ORAL | Status: DC

## 2014-03-18 MED ORDER — MESALAMINE ER 500 MG PO CPCR: 1000.0000 mg | ORAL_CAPSULE | Freq: Two times a day (BID) | ORAL | Status: DC

## 2014-03-18 MED ORDER — MESALAMINE 1000 MG RE SUPP: 1000.0000 mg | Freq: Every day | RECTAL | Status: DC

## 2014-03-18 MED ORDER — CLOBETASOL PROPIONATE 0.05 % EX CREA
1.0000 | TOPICAL_CREAM | Freq: Two times a day (BID) | CUTANEOUS | Status: DC
Start: 2014-03-18 — End: 2014-07-23

## 2014-03-18 NOTE — Progress Notes (Signed)
Presenting complaint;  Persistent diarrhea. History of ulcerative colitis.  Subjective:  Patient is 54 year old Caucasian male who has complicated GI history. He has history of ulcerative colitis. 2 years ago he presented with fulminant C. difficile colitis leading to subtotal colectomy with ileostomy which was taken down by Dr. Abran Cantor in May last year. 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 He's been maintained on oral and topical mesalamine. He continues to have diarrhea. On most days he is having 10 to stools. He has bowel movements during day and night. Only once he had 7 stools in 24-hour period. He has occasional hematochezia. He continues to complain of discomfort in his pelvic region which she describes as soreness in his core. Most of his stools are watery to mushy and at times he passes bits and pieces of formed stool. His appetite is good. He has gained 6 pounds since his last visit of 11/04/2013. He states he is able to sleep for a follow-up before he has to get up and have a bowel movement. He is taking one Lomotil twice a day.   Current Medications: Outpatient Encounter Prescriptions as of 03/18/2014  Medication Sig  . aspirin 81 MG chewable tablet Chew 81 mg by mouth daily.  . diphenoxylate-atropine (LOMOTIL) 2.5-0.025 MG per tablet Take 1 tablet by mouth 4 (four) times daily as needed for diarrhea or loose stools.  . fluticasone (FLONASE) 50 MCG/ACT nasal spray Place 1 spray into both nostrils daily.   . mesalamine (CANASA) 1000 MG suppository Place 1 suppository (1,000 mg total) rectally at bedtime.  . mesalamine (PENTASA) 500 MG CR capsule Take 2 capsules (1,000 mg total) by mouth 2 (two) times daily. Patient takes 2 by mouth twice daily  . Multiple Vitamin (MULTIVITAMIN) LIQD Take 5 mLs by mouth daily.  Marland Kitchen nystatin-triamcinolone ointment (MYCOLOG) Apply 1 application topically as needed.   .  saccharomyces boulardii (FLORASTOR) 250 MG capsule Take 250 mg by mouth 2 (two) times daily.     Objective: Blood pressure 118/72, pulse 68, temperature 97.4 F (36.3 C), temperature source Oral, resp. rate 18, height 5' 10"  (1.778 m), weight 161 lb 12.8 oz (73.392 kg). Patient is alert and in no acute distress Conjunctiva is pink. Sclera is nonicteric Oropharyngeal mucosa is normal. No neck masses or thyromegaly noted. Cardiac exam with regular rhythm normal S1 and S2. No murmur or gallop noted. Lungs are clear to auscultation. Abdomen is symmetrical with midline scar. On palpation abdomen is soft and nontender without organomegaly or masses. No LE edema or clubbing noted. He has limited flexion to fingers of right hand.   Assessment:  #1. 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 This was my concern before takedown of ileostomy. Hopefully we can decrease frequency of defecation with antidiarrheals. 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  Plan:  Change diphenoxylate to 4 tablets daily; he will take 1 tablet before breakfast 1 before lunch and two before evening meal. He will continue to monitor his diet and try to eliminate foods which lead to more diarrhea. New prescription given for diphenoxylate, Pentasa and Canasa suppository. Progress report support in 4 weeks. Office visit in 3 months

## 2014-03-18 NOTE — Patient Instructions (Signed)
Progress report in one month via my chart.

## 2014-05-12 ENCOUNTER — Encounter (INDEPENDENT_AMBULATORY_CARE_PROVIDER_SITE_OTHER): Payer: Self-pay | Admitting: Internal Medicine

## 2014-05-12 ENCOUNTER — Ambulatory Visit (INDEPENDENT_AMBULATORY_CARE_PROVIDER_SITE_OTHER): Payer: BLUE CROSS/BLUE SHIELD | Admitting: Internal Medicine

## 2014-05-12 VITALS — BP 102/68 | HR 66 | Temp 98.7°F | Resp 18 | Ht 70.0 in | Wt 164.2 lb

## 2014-05-12 DIAGNOSIS — R748 Abnormal levels of other serum enzymes: Secondary | ICD-10-CM | POA: Diagnosis not present

## 2014-05-12 DIAGNOSIS — D649 Anemia, unspecified: Secondary | ICD-10-CM | POA: Diagnosis not present

## 2014-05-12 DIAGNOSIS — Z8719 Personal history of other diseases of the digestive system: Secondary | ICD-10-CM | POA: Diagnosis not present

## 2014-05-12 DIAGNOSIS — R7989 Other specified abnormal findings of blood chemistry: Secondary | ICD-10-CM

## 2014-05-12 DIAGNOSIS — R197 Diarrhea, unspecified: Secondary | ICD-10-CM | POA: Diagnosis not present

## 2014-05-12 MED ORDER — DIPHENOXYLATE-ATROPINE 2.5-0.025 MG PO TABS: 1.0000 | ORAL_TABLET | Freq: Four times a day (QID) | ORAL | Status: DC

## 2014-05-12 NOTE — Patient Instructions (Signed)
Try eating multiple small meals. Physician will call with results of blood work when completed

## 2014-05-13 NOTE — Progress Notes (Signed)
Presenting complaint;  Diarrhea. History of ulcerative colitis.  Subjective:  Patient is 54 year old Caucasian male who has has history of ulcerative colitis and chronic diarrhea and is here for scheduled visit. He was last seen 8 weeks ago. He had subtotal colectomy for fulminant C. difficile colitis with ileostomy in February 2014. He had takedown of colostomy in May last year. He underwent flexible sigmoidoscopy which revealed noncritical ileorectal stricture and ulcerative proctitis. He remains on Pentasa and has been using mesalamine suppositories. He states he is feeling some better. He is having an average of 8-9 bowel movements per day. He had 6 on his best day. About 30% of his stools are semi-formed or formed. He has been having nocturnal bowel movements but he hasn't had any in the last 5 days. He is having much less pain at defecation and he also reports significant reduction in pelvic pain which she describes as pain in his core. He denies melena or rectal bleeding. He has psoriatic hematochezia in the form of blood on the tissue. His appetite is very good. He has gained 3 pounds since his last visit. He states most of his bowel movements occur within minutes of having meals. Only side effect he is experienced with Lomotil is dry mouth. He is doing daily) exercise and his grip is getting better.   Current Medications: Outpatient Encounter Prescriptions as of 05/12/2014  Medication Sig  . aspirin 81 MG chewable tablet Chew 81 mg by mouth daily.  . clobetasol cream (TEMOVATE) 5.49 % Apply 1 application topically 2 (two) times daily. Apply to area with skin rash twice daily until rash resolved  . diphenoxylate-atropine (LOMOTIL) 2.5-0.025 MG per tablet Take 1 tablet by mouth 4 (four) times daily.  . fluticasone (FLONASE) 50 MCG/ACT nasal spray Place 1 spray into both nostrils daily.   . mesalamine (PENTASA) 500 MG CR capsule Take 2 capsules (1,000 mg total) by mouth 2 (two) times daily.  Patient takes 2 by mouth twice daily  . Multiple Vitamin (MULTIVITAMIN) LIQD Take 5 mLs by mouth daily.  Marland Kitchen nystatin-triamcinolone ointment (MYCOLOG) Apply 1 application topically as needed.   . saccharomyces boulardii (FLORASTOR) 250 MG capsule Take 250 mg by mouth daily.  . [DISCONTINUED] diphenoxylate-atropine (LOMOTIL) 2.5-0.025 MG per tablet Take 1 tablet by mouth 4 (four) times daily.  . [DISCONTINUED] mesalamine (CANASA) 1000 MG suppository Place 1 suppository (1,000 mg total) rectally at bedtime.     Objective: Blood pressure 102/68, pulse 66, temperature 98.7 F (37.1 C), temperature source Oral, resp. rate 18, height 5' 10"  (1.778 m), weight 164 lb 3.2 oz (74.481 kg). Patient is alert and in no acute distress. Conjunctiva is pink. Sclera is nonicteric Oropharyngeal mucosa is normal. No neck masses or thyromegaly noted. Cardiac exam with regular rhythm normal S1 and S2. No murmur or gallop noted. Lungs are clear to auscultation. Abdomen is symmetrical. Bowel sounds are normal. On palpation abdomen is soft and nontender without organomegaly or masses.  No LE edema or clubbing noted. Right hand grip is weak as he has limited flexion involving MCPs,PIPs and DIPs.  Labs/studies Results: Lab data from 06/01/2013  H&H 10.5 and 31.5 and platelet count 2:30 1K  Serum creatinine was 1.37  Assessment:  #1. Chronic diarrhea since he does not have residual colon. Frequency of bowel movements has decreased slightly but consistency has improved significantly. He might benefit from eating multiple small meals rather than 3 meals a day. #2. History of ulcerative colitis. He has subtotal colectomy for fulminant  C. difficile colitis and now is left with rectum. Therefore he needs to be treated for UC chronically.  #3. History of anemia and elevated serum creatinine.  Plan:  Continue diphenoxylate at a dose of 4 tablets per day but he can try taking 2 before breakfast 1 before lunch and  evening meal and see if it works better. Discontinue Canasa suppositories. Eat multiple small meals daily. New prescription given for diphenoxylate 120 doses with three refills. CBC and comprehensive chemistry panel. Office visit in 4 months.

## 2014-05-18 ENCOUNTER — Encounter (INDEPENDENT_AMBULATORY_CARE_PROVIDER_SITE_OTHER): Payer: Self-pay | Admitting: Internal Medicine

## 2014-05-19 ENCOUNTER — Other Ambulatory Visit (INDEPENDENT_AMBULATORY_CARE_PROVIDER_SITE_OTHER): Payer: Self-pay | Admitting: *Deleted

## 2014-05-19 MED ORDER — MESALAMINE 1000 MG RE SUPP: 1000.0000 mg | Freq: Every day | RECTAL | Status: DC

## 2014-05-19 NOTE — Telephone Encounter (Signed)
Patient called and states that he has not been using the [place] and he is having multiple bowel movements. He ask if he may take it a little longer, Per Dr.Rehman he may. Rx e-scribed to [facility]. Patient made aware.

## 2014-06-03 LAB — COMPREHENSIVE METABOLIC PANEL
ALBUMIN: 3.3 g/dL — AB (ref 3.5–5.2)
ALT: 11 U/L (ref 0–53)
AST: 13 U/L (ref 0–37)
Alkaline Phosphatase: 69 U/L (ref 39–117)
BILIRUBIN TOTAL: 0.2 mg/dL (ref 0.2–1.2)
BUN: 24 mg/dL — AB (ref 6–23)
CO2: 27 mEq/L (ref 19–32)
CREATININE: 1.23 mg/dL (ref 0.50–1.35)
Calcium: 8.1 mg/dL — ABNORMAL LOW (ref 8.4–10.5)
Chloride: 108 mEq/L (ref 96–112)
GLUCOSE: 107 mg/dL — AB (ref 70–99)
Potassium: 4.9 mEq/L (ref 3.5–5.3)
SODIUM: 141 meq/L (ref 135–145)
Total Protein: 5.7 g/dL — ABNORMAL LOW (ref 6.0–8.3)

## 2014-06-03 LAB — CBC
HCT: 34.3 % — ABNORMAL LOW (ref 39.0–52.0)
Hemoglobin: 11.1 g/dL — ABNORMAL LOW (ref 13.0–17.0)
MCH: 29.4 pg (ref 26.0–34.0)
MCHC: 32.4 g/dL (ref 30.0–36.0)
MCV: 90.7 fL (ref 78.0–100.0)
MPV: 8.4 fL — AB (ref 8.6–12.4)
Platelets: 273 10*3/uL (ref 150–400)
RBC: 3.78 MIL/uL — ABNORMAL LOW (ref 4.22–5.81)
RDW: 14.4 % (ref 11.5–15.5)
WBC: 7 10*3/uL (ref 4.0–10.5)

## 2014-06-05 ENCOUNTER — Other Ambulatory Visit (INDEPENDENT_AMBULATORY_CARE_PROVIDER_SITE_OTHER): Payer: Self-pay | Admitting: Internal Medicine

## 2014-06-05 MED ORDER — CHOLESTYRAMINE 4 G PO PACK: 4.0000 g | PACK | Freq: Two times a day (BID) | ORAL | Status: DC

## 2014-07-01 ENCOUNTER — Ambulatory Visit (INDEPENDENT_AMBULATORY_CARE_PROVIDER_SITE_OTHER): Payer: BLUE CROSS/BLUE SHIELD | Admitting: Internal Medicine

## 2014-07-07 ENCOUNTER — Encounter (INDEPENDENT_AMBULATORY_CARE_PROVIDER_SITE_OTHER): Payer: Self-pay | Admitting: Internal Medicine

## 2014-07-15 ENCOUNTER — Other Ambulatory Visit: Payer: Self-pay | Admitting: Urology

## 2014-07-16 ENCOUNTER — Encounter (HOSPITAL_COMMUNITY): Payer: Self-pay

## 2014-07-16 ENCOUNTER — Inpatient Hospital Stay (HOSPITAL_COMMUNITY)
Admission: EM | Admit: 2014-07-16 | Discharge: 2014-07-20 | DRG: 872 | Disposition: A | Discharge status: Home / Self Care | Payer: BLUE CROSS/BLUE SHIELD | Attending: Urology | Admitting: Urology

## 2014-07-16 ENCOUNTER — Inpatient Hospital Stay (HOSPITAL_COMMUNITY): Payer: BLUE CROSS/BLUE SHIELD | Admitting: Certified Registered"

## 2014-07-16 ENCOUNTER — Encounter (HOSPITAL_COMMUNITY)
Admission: EM | Disposition: A | Discharge status: Home / Self Care | Payer: Self-pay | Source: Home / Self Care | Attending: Urology

## 2014-07-16 DIAGNOSIS — A419 Sepsis, unspecified organism: Secondary | ICD-10-CM | POA: Diagnosis present

## 2014-07-16 DIAGNOSIS — N132 Hydronephrosis with renal and ureteral calculous obstruction: Secondary | ICD-10-CM | POA: Diagnosis present

## 2014-07-16 DIAGNOSIS — K5190 Ulcerative colitis, unspecified, without complications: Secondary | ICD-10-CM | POA: Diagnosis present

## 2014-07-16 DIAGNOSIS — Z87442 Personal history of urinary calculi: Secondary | ICD-10-CM

## 2014-07-16 DIAGNOSIS — Z885 Allergy status to narcotic agent status: Secondary | ICD-10-CM | POA: Diagnosis not present

## 2014-07-16 DIAGNOSIS — Z888 Allergy status to other drugs, medicaments and biological substances status: Secondary | ICD-10-CM

## 2014-07-16 DIAGNOSIS — N200 Calculus of kidney: Secondary | ICD-10-CM

## 2014-07-16 DIAGNOSIS — Z8249 Family history of ischemic heart disease and other diseases of the circulatory system: Secondary | ICD-10-CM | POA: Diagnosis not present

## 2014-07-16 DIAGNOSIS — Z809 Family history of malignant neoplasm, unspecified: Secondary | ICD-10-CM

## 2014-07-16 DIAGNOSIS — Z8673 Personal history of transient ischemic attack (TIA), and cerebral infarction without residual deficits: Secondary | ICD-10-CM

## 2014-07-16 DIAGNOSIS — Z881 Allergy status to other antibiotic agents status: Secondary | ICD-10-CM

## 2014-07-16 DIAGNOSIS — I480 Paroxysmal atrial fibrillation: Secondary | ICD-10-CM | POA: Diagnosis present

## 2014-07-16 DIAGNOSIS — N10 Acute tubulo-interstitial nephritis: Secondary | ICD-10-CM

## 2014-07-16 DIAGNOSIS — I959 Hypotension, unspecified: Secondary | ICD-10-CM | POA: Diagnosis present

## 2014-07-16 DIAGNOSIS — R5381 Other malaise: Secondary | ICD-10-CM | POA: Diagnosis present

## 2014-07-16 DIAGNOSIS — Z87891 Personal history of nicotine dependence: Secondary | ICD-10-CM

## 2014-07-16 DIAGNOSIS — Z880 Allergy status to penicillin: Secondary | ICD-10-CM | POA: Diagnosis not present

## 2014-07-16 DIAGNOSIS — R509 Fever, unspecified: Secondary | ICD-10-CM

## 2014-07-16 HISTORY — PX: CYSTOSCOPY WITH RETROGRADE PYELOGRAM, URETEROSCOPY AND STENT PLACEMENT: SHX5789

## 2014-07-16 LAB — CBC WITH DIFFERENTIAL/PLATELET
BASOS ABS: 0.1 10*3/uL (ref 0.0–0.1)
Basophils Relative: 1 % (ref 0–1)
Eosinophils Absolute: 0 10*3/uL (ref 0.0–0.7)
Eosinophils Relative: 0 % (ref 0–5)
HCT: 40.9 % (ref 39.0–52.0)
Hemoglobin: 13.6 g/dL (ref 13.0–17.0)
LYMPHS ABS: 1.1 10*3/uL (ref 0.7–4.0)
LYMPHS PCT: 19 % (ref 12–46)
MCH: 30 pg (ref 26.0–34.0)
MCHC: 33.3 g/dL (ref 30.0–36.0)
MCV: 90.1 fL (ref 78.0–100.0)
MONO ABS: 0.3 10*3/uL (ref 0.1–1.0)
MONOS PCT: 6 % (ref 3–12)
Neutro Abs: 4.2 10*3/uL (ref 1.7–7.7)
Neutrophils Relative %: 74 % (ref 43–77)
Platelets: 107 10*3/uL — ABNORMAL LOW (ref 150–400)
RBC: 4.54 MIL/uL (ref 4.22–5.81)
RDW: 13.9 % (ref 11.5–15.5)
WBC: 5.7 10*3/uL (ref 4.0–10.5)

## 2014-07-16 LAB — SURGICAL PCR SCREEN
MRSA, PCR: NEGATIVE
Staphylococcus aureus: NEGATIVE

## 2014-07-16 LAB — URINE MICROSCOPIC-ADD ON

## 2014-07-16 LAB — URINALYSIS, ROUTINE W REFLEX MICROSCOPIC
Glucose, UA: NEGATIVE mg/dL
Ketones, ur: 15 mg/dL — AB
LEUKOCYTES UA: NEGATIVE
NITRITE: NEGATIVE
PROTEIN: 100 mg/dL — AB
Specific Gravity, Urine: 1.016 (ref 1.005–1.030)
UROBILINOGEN UA: 0.2 mg/dL (ref 0.0–1.0)
pH: 6 (ref 5.0–8.0)

## 2014-07-16 LAB — I-STAT CHEM 8, ED
BUN: 16 mg/dL (ref 6–20)
CHLORIDE: 97 mmol/L — AB (ref 101–111)
Calcium, Ion: 1.06 mmol/L — ABNORMAL LOW (ref 1.12–1.23)
Creatinine, Ser: 1.6 mg/dL — ABNORMAL HIGH (ref 0.61–1.24)
Glucose, Bld: 89 mg/dL (ref 65–99)
HEMATOCRIT: 44 % (ref 39.0–52.0)
Hemoglobin: 15 g/dL (ref 13.0–17.0)
Potassium: 4 mmol/L (ref 3.5–5.1)
Sodium: 130 mmol/L — ABNORMAL LOW (ref 135–145)
TCO2: 21 mmol/L (ref 0–100)

## 2014-07-16 SURGERY — CYSTOURETEROSCOPY, WITH RETROGRADE PYELOGRAM AND STENT INSERTION
Anesthesia: General | Site: Ureter | Laterality: Left

## 2014-07-16 MED ORDER — ONDANSETRON HCL 4 MG/2ML IJ SOLN
4.0000 mg | INTRAMUSCULAR | Status: DC | PRN
  Administered 2014-07-19: 4 mg via INTRAVENOUS
  Filled 2014-07-16: qty 2

## 2014-07-16 MED ORDER — LACTATED RINGERS IV SOLN
INTRAVENOUS | Status: DC | PRN
  Administered 2014-07-16 (×2): via INTRAVENOUS

## 2014-07-16 MED ORDER — GENTAMICIN SULFATE 40 MG/ML IJ SOLN
500.0000 mg | Freq: Once | INTRAVENOUS | Status: AC
  Administered 2014-07-16: 500 mg via INTRAVENOUS
  Filled 2014-07-16: qty 12.5

## 2014-07-16 MED ORDER — 0.9 % SODIUM CHLORIDE (POUR BTL) OPTIME
TOPICAL | Status: DC | PRN
  Administered 2014-07-16: 1000 mL

## 2014-07-16 MED ORDER — PHENYLEPHRINE HCL 10 MG/ML IJ SOLN
INTRAMUSCULAR | Status: DC | PRN
  Administered 2014-07-16 (×5): 40 ug via INTRAVENOUS

## 2014-07-16 MED ORDER — LIDOCAINE HCL (CARDIAC) 20 MG/ML IV SOLN
INTRAVENOUS | Status: DC | PRN
  Administered 2014-07-16: 50 mg via INTRAVENOUS

## 2014-07-16 MED ORDER — SODIUM CHLORIDE 0.9 % IR SOLN
Status: DC | PRN
  Administered 2014-07-16: 4000 mL

## 2014-07-16 MED ORDER — CIPROFLOXACIN IN D5W 400 MG/200ML IV SOLN
400.0000 mg | Freq: Two times a day (BID) | INTRAVENOUS | Status: DC
  Administered 2014-07-16 – 2014-07-20 (×8): 400 mg via INTRAVENOUS
  Filled 2014-07-16 (×9): qty 200

## 2014-07-16 MED ORDER — PROPOFOL 10 MG/ML IV BOLUS
INTRAVENOUS | Status: DC | PRN
  Administered 2014-07-16: 150 mg via INTRAVENOUS

## 2014-07-16 MED ORDER — FENTANYL CITRATE (PF) 100 MCG/2ML IJ SOLN
INTRAMUSCULAR | Status: AC
  Filled 2014-07-16: qty 2

## 2014-07-16 MED ORDER — HYDROMORPHONE HCL 1 MG/ML IJ SOLN
0.5000 mg | INTRAMUSCULAR | Status: DC | PRN
  Administered 2014-07-16: 0.5 mg via INTRAVENOUS
  Filled 2014-07-16: qty 1

## 2014-07-16 MED ORDER — IOHEXOL 300 MG/ML  SOLN
INTRAMUSCULAR | Status: DC | PRN
  Administered 2014-07-16: 20 mL via URETHRAL

## 2014-07-16 MED ORDER — MESALAMINE 1000 MG RE SUPP
1000.0000 mg | Freq: Every day | RECTAL | Status: DC
  Administered 2014-07-16 – 2014-07-19 (×4): 1000 mg via RECTAL
  Filled 2014-07-16 (×4): qty 1

## 2014-07-16 MED ORDER — SUCCINYLCHOLINE CHLORIDE 20 MG/ML IJ SOLN
INTRAMUSCULAR | Status: DC | PRN
  Administered 2014-07-16: 100 mg via INTRAVENOUS

## 2014-07-16 MED ORDER — PROPOFOL 10 MG/ML IV BOLUS
INTRAVENOUS | Status: AC
  Filled 2014-07-16: qty 20

## 2014-07-16 MED ORDER — PROMETHAZINE HCL 25 MG/ML IJ SOLN
6.2500 mg | INTRAMUSCULAR | Status: DC | PRN
Start: 2014-07-16 — End: 2014-07-16

## 2014-07-16 MED ORDER — LIDOCAINE HCL (CARDIAC) 20 MG/ML IV SOLN
INTRAVENOUS | Status: AC
  Filled 2014-07-16: qty 5

## 2014-07-16 MED ORDER — OXYCODONE-ACETAMINOPHEN 5-325 MG PO TABS
1.0000 | ORAL_TABLET | ORAL | Status: DC | PRN
  Administered 2014-07-17 – 2014-07-19 (×8): 1 via ORAL
  Filled 2014-07-16 (×8): qty 1

## 2014-07-16 MED ORDER — MIDAZOLAM HCL 2 MG/2ML IJ SOLN
INTRAMUSCULAR | Status: AC
  Filled 2014-07-16: qty 2

## 2014-07-16 MED ORDER — ASPIRIN 81 MG PO CHEW
81.0000 mg | CHEWABLE_TABLET | Freq: Every day | ORAL | Status: DC
  Administered 2014-07-17 – 2014-07-20 (×4): 81 mg via ORAL
  Filled 2014-07-16 (×4): qty 1

## 2014-07-16 MED ORDER — MESALAMINE ER 250 MG PO CPCR
1000.0000 mg | ORAL_CAPSULE | Freq: Two times a day (BID) | ORAL | Status: DC
  Administered 2014-07-16 – 2014-07-20 (×8): 1000 mg via ORAL
  Filled 2014-07-16 (×10): qty 4

## 2014-07-16 MED ORDER — ACETAMINOPHEN 10 MG/ML IV SOLN
INTRAVENOUS | Status: AC
  Filled 2014-07-16: qty 100

## 2014-07-16 MED ORDER — DIPHENHYDRAMINE HCL 50 MG/ML IJ SOLN
12.5000 mg | Freq: Once | INTRAMUSCULAR | Status: AC
  Administered 2014-07-16: 12.5 mg via INTRAVENOUS
  Filled 2014-07-16: qty 1

## 2014-07-16 MED ORDER — CIPROFLOXACIN IN D5W 400 MG/200ML IV SOLN
400.0000 mg | Freq: Once | INTRAVENOUS | Status: AC
  Administered 2014-07-16: 400 mg via INTRAVENOUS
  Filled 2014-07-16: qty 200

## 2014-07-16 MED ORDER — ACETAMINOPHEN 10 MG/ML IV SOLN
1000.0000 mg | Freq: Once | INTRAVENOUS | Status: AC
  Administered 2014-07-16: 1000 mg via INTRAVENOUS

## 2014-07-16 MED ORDER — CHOLESTYRAMINE 4 G PO PACK
4.0000 g | PACK | Freq: Two times a day (BID) | ORAL | Status: DC
  Administered 2014-07-16: 4 g via ORAL
  Filled 2014-07-16 (×9): qty 1

## 2014-07-16 MED ORDER — DIPHENOXYLATE-ATROPINE 2.5-0.025 MG PO TABS
1.0000 | ORAL_TABLET | Freq: Four times a day (QID) | ORAL | Status: DC
Start: 2014-07-16 — End: 2014-07-20
  Administered 2014-07-16 – 2014-07-20 (×15): 1 via ORAL
  Filled 2014-07-16 (×16): qty 1

## 2014-07-16 MED ORDER — SODIUM CHLORIDE 0.9 % IV SOLN
INTRAVENOUS | Status: DC
  Administered 2014-07-16 – 2014-07-20 (×9): via INTRAVENOUS

## 2014-07-16 MED ORDER — FENTANYL CITRATE (PF) 100 MCG/2ML IJ SOLN
25.0000 ug | INTRAMUSCULAR | Status: DC | PRN
  Administered 2014-07-16: 50 ug via INTRAVENOUS

## 2014-07-16 SURGICAL SUPPLY — 24 items
BASKET LASER NITINOL 1.9FR (BASKET) IMPLANT
BASKET STNLS GEMINI 4WIRE 3FR (BASKET) IMPLANT
BASKET ZERO TIP NITINOL 2.4FR (BASKET) IMPLANT
CATH INTERMIT  6FR 70CM (CATHETERS) ×3 IMPLANT
CATH KUMPE (CATHETERS) ×2
CATH SLIP 5FR 0.38 X 40 KMP (CATHETERS) ×1 IMPLANT
CLOTH BEACON ORANGE TIMEOUT ST (SAFETY) IMPLANT
ELECT REM PT RETURN 9FT ADLT (ELECTROSURGICAL)
ELECTRODE REM PT RTRN 9FT ADLT (ELECTROSURGICAL) IMPLANT
FIBER LASER FLEXIVA 1000 (UROLOGICAL SUPPLIES) IMPLANT
FIBER LASER FLEXIVA 200 (UROLOGICAL SUPPLIES) IMPLANT
FIBER LASER FLEXIVA 365 (UROLOGICAL SUPPLIES) IMPLANT
FIBER LASER FLEXIVA 550 (UROLOGICAL SUPPLIES) IMPLANT
FIBER LASER TRAC TIP (UROLOGICAL SUPPLIES) IMPLANT
GLOVE BIOGEL M STRL SZ7.5 (GLOVE) ×3 IMPLANT
GOWN STRL REUS W/TWL XL LVL3 (GOWN DISPOSABLE) ×6 IMPLANT
GUIDEWIRE ANG ZIPWIRE 038X150 (WIRE) IMPLANT
GUIDEWIRE STR DUAL SENSOR (WIRE) ×3 IMPLANT
IV NS IRRIG 3000ML ARTHROMATIC (IV SOLUTION) ×3 IMPLANT
PACK CYSTO (CUSTOM PROCEDURE TRAY) ×3 IMPLANT
STENT CONTOUR 6FRX26X.038 (STENTS) ×3 IMPLANT
SYRINGE 10CC LL (SYRINGE) IMPLANT
SYRINGE IRR TOOMEY STRL 70CC (SYRINGE) IMPLANT
TUBE FEEDING 8FR 16IN STR KANG (MISCELLANEOUS) ×3 IMPLANT

## 2014-07-16 NOTE — ED Notes (Signed)
Nurse is going to put in an IV.

## 2014-07-16 NOTE — Anesthesia Preprocedure Evaluation (Addendum)
Anesthesia Evaluation  Patient identified by MRN, date of birth, ID band Patient awake    Reviewed: Allergy & Precautions, NPO status   Airway Mallampati: II  TM Distance: >3 FB Neck ROM: Full    Dental   Pulmonary pneumonia -, former smoker,  breath sounds clear to auscultation        Cardiovascular + dysrhythmias Atrial Fibrillation Rhythm:Regular Rate:Normal     Neuro/Psych    GI/Hepatic Neg liver ROS, PUD, GI history noted. CE   Endo/Other  negative endocrine ROS  Renal/GU Renal disease     Musculoskeletal   Abdominal   Peds  Hematology   Anesthesia Other Findings   Reproductive/Obstetrics                            Anesthesia Physical Anesthesia Plan  ASA: III  Anesthesia Plan: General   Post-op Pain Management:    Induction: Intravenous  Airway Management Planned: Oral ETT  Additional Equipment:   Intra-op Plan:   Post-operative Plan: Extubation in OR  Informed Consent: I have reviewed the patients History and Physical, chart, labs and discussed the procedure including the risks, benefits and alternatives for the proposed anesthesia with the patient or authorized representative who has indicated his/her understanding and acceptance.   Dental advisory given  Plan Discussed with: CRNA, Anesthesiologist and Surgeon  Anesthesia Plan Comments:        Anesthesia Quick Evaluation

## 2014-07-16 NOTE — Progress Notes (Signed)
ANTIBIOTIC CONSULT NOTE - INITIAL  Pharmacy Consult for Gentamicin Indication: rule out sepsis  Allergies  Allergen Reactions  . Penicillins Other (See Comments)    Heart rate changes  . Claritin [Loratadine]     Rash  . Clindamycin/Lincomycin Diarrhea    C-diff  . Morphine And Related     Nausea/vomiting  . Imipenem Rash    Primaxin antibiotic    Patient Measurements: Weight: 161 lb (73.029 kg) Height: 5'10" IBW: 70 kg  Vital Signs: Temp: 98.9 F (37.2 C) (06/22 0945) Temp Source: Oral (06/22 0945) BP: 100/63 mmHg (06/22 0945) Pulse Rate: 102 (06/22 0945) Intake/Output from previous day:   Intake/Output from this shift:    Labs:  Recent Labs  07/16/14 1028 07/16/14 1034  WBC 5.7  --   HGB 13.6 15.0  PLT 107*  --   CREATININE  --  1.60*   Estimated Creatinine Clearance: 53.2 mL/min (by C-G formula based on Cr of 1.6). No results for input(s): VANCOTROUGH, VANCOPEAK, VANCORANDOM, GENTTROUGH, GENTPEAK, GENTRANDOM, TOBRATROUGH, TOBRAPEAK, TOBRARND, AMIKACINPEAK, AMIKACINTROU, AMIKACIN in the last 72 hours.   Microbiology: No results found for this or any previous visit (from the past 720 hour(s)).  Medical History: Past Medical History  Diagnosis Date  . Chronic diarrhea   . Rectal bleed   . Hemorrhoids   . Ulcerative colitis     Distal UC  . 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 PAF (paroxysmal atrial fibrillation) March 2014   . Perforation bowel 2013    sigmoid colon  . Kidney stones     Medications:   (Not in a hospital admission)  Assessment: 54 yo male with left renal stone and hydronephrosis. Presents to ED with fever, malaise and concern for sepsis. MD has started IV cipro and Pharmacy is consulted to dose gentamicin.  6/22 >> Cipro >> 6/22 >> Gent >>  6/22 blood x 2: sent 6/22 urine: sent  Goal of Therapy:  10 hr gentamicin level per  [place] Nomogram Eradication of infection  Plan:   Gentamicin 585m (~7 mg/kg) IV x 1 dose  Check gentamicin level 10 hrs after dose given and adjust dosing interval per nomogram Follow up renal function & cultures, clinical course  EPeggyann Juba PharmD, BCPS Pager: [phone] 15706/22/2016,11:36 AM

## 2014-07-16 NOTE — Progress Notes (Signed)
Pt has patchy non raised rash on arms and chest. Dilaudid just given at 1800. Paged MD for benedryl order. Will continue to monitor.

## 2014-07-16 NOTE — ED Provider Notes (Signed)
CSN: [id number]     Arrival date & time 07/16/14  0940 History   First MD Initiated Contact with Patient 07/16/14 0945     Chief Complaint  Patient presents with  . Nephrolithiasis  . Fever     (Consider location/radiation/quality/duration/timing/severity/associated sxs/prior Treatment) Patient is a 54 y.o. male presenting with fever. The history is provided by the patient.  Fever Associated symptoms: dysuria and nausea   Associated symptoms: no chest pain, no diarrhea, no headaches, no rash and no vomiting    patient has had fever for the last couple weeks. Does come down with Tylenol. His been treated for urinary tract infection and had a CT scan showed a stone on the left side it was very high up but did have hydronephrosis. Seen in urology yesterday and urine done at that time. Dr. Tresa Moore sent the patient today comes with continued fevers. Plan is for a stent. Has a history of ulcerative colitis. States this does not feel like his colitis. No diarrhea. No constipation. Has had some dysuria. Pain is on the left side of the abdomen.  Past Medical History  Diagnosis Date  . Chronic diarrhea   . Rectal bleed   . Hemorrhoids   . Ulcerative colitis     Distal UC  . 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 PAF (paroxysmal atrial fibrillation) March 2014   . Perforation bowel 2013    sigmoid colon  . Kidney stones    Past Surgical History  Procedure Laterality Date  . Colonoscopy  9/03  . Sigmoidoscopy       06/13/2002  . Temporary ostomy November 06, 2010      for a colon perforation that was done in [facility] (Dr Payton Doughty).    . Colonoscopy  02/16/2011    Procedure: COLONOSCOPY;  Surgeon: Rogene Houston, MD;  [facility];  Service: Endoscopy;  Laterality: N/A;  100  . Flexible sigmoidoscopy  02/27/2012    Procedure: FLEXIBLE SIGMOIDOSCOPY;  Surgeon: Rogene Houston, MD;   [facility];  Service: Endoscopy;  Laterality: N/A;  with colonic decompression  . Laparotomy  02/27/2012    Procedure: EXPLORATORY LAPAROTOMY;  Surgeon: Donato Heinz, MD;  [facility]: [facility];  Service: General;  Laterality: N/A;  . Cecostomy  02/27/2012    Procedure: CECOSTOMY;  Surgeon: Donato Heinz, MD;  [facility];  Service: General;  Laterality: N/A;  Cecostomy Tube Placement  . Colectomy  02/28/2012    Procedure: TOTAL COLECTOMY;  Surgeon: Donato Heinz, MD;  [facility];  Service: General;  Laterality: N/A;  . Ileostomy  02/28/2012    Procedure: ILEOSTOMY;  Surgeon: Donato Heinz, MD;  [facility];  Service: General;  Laterality: N/A;  . Cystoscopy w/ ureteral stent placement Bilateral 03/20/2012    Procedure: CYSTOSCOPY WITH RETROGRADE PYELOGRAM/;  Surgeon: Alexis Frock, MD;  Location: [place];  Service: Urology;  Laterality: Bilateral. Pt reports no stents were placed.  . Laparotomy N/A 03/26/2012    Procedure: EXPLORATORY LAPAROTOMY;  Surgeon: Edward Jolly, MD;  Location: [place];  Service: General;  Laterality: N/A;  . Application of wound vac N/A 03/26/2012    Procedure: APPLICATION OF WOUND VAC;  Surgeon: Edward Jolly, MD;  Location: [place];  Service: General;  Laterality: N/A;  . Liver biopsy N/A 03/26/2012  Procedure: LIVER BIOPSY;  Surgeon: Edward Jolly, MD;  Location: [place];  Service: General;  Laterality: N/A;  . Laparotomy N/A 03/29/2012    Procedure: EXPLORATORY LAPAROTOMY, PARTIAL WOUND CLOSURE;  Surgeon: Edward Jolly, MD;  Location: [place];  Service: General;  Laterality: N/A;  . Vacuum assisted closure change N/A 03/29/2012    Procedure: Open ABDOMINAL VACUUM  CHANGE;  Surgeon: Edward Jolly, MD;  Location: [place];  Service: General;  Laterality: N/A;  . Vacuum assisted closure change N/A 04/01/2012    Procedure: removal of abdominal vac dressing and abdominal closure;  Surgeon: Edward Jolly, MD;  Location: [place];   Service: General;  Laterality: N/A;  . Flexible sigmoidoscopy N/A 02/06/2013    Procedure: FLEXIBLE SIGMOIDOSCOPY;  Surgeon: Rogene Houston, MD;  [facility];  Service: Endoscopy;  Laterality: N/A;  100-moved to 1200 Ann notified pt  . Flexible sigmoidoscopy N/A 03/27/2013    Procedure: FLEXIBLE SIGMOIDOSCOPY;  Surgeon: Rogene Houston, MD;  [facility];  Service: Endoscopy;  Laterality: N/A;  225  . Ileostomy closure N/A 05/27/2013    Procedure: ILEOSTOMY TAKEDOWN WITH ILEOPROCTOSTOMY;  Surgeon: Edward Jolly, MD;  [facility];  Service: General;  Laterality: N/A;  . Flexible sigmoidoscopy N/A 12/11/2013    Procedure: FLEXIBLE SIGMOIDOSCOPY;  Surgeon: Rogene Houston, MD;  [facility];  Service: Endoscopy;  Laterality: N/A;  200   Family History  Problem Relation Age of Onset  . Cancer Sister   . Healthy Daughter   . Hypertension Mother   . Colon cancer Neg Hx    History  Substance Use Topics  . Smoking status: Former Smoker -- 0.50 packs/day for 12 years    Types: Cigarettes    Quit date: 07/17/1984  . Smokeless tobacco: Never Used     Comment: Quit 23 yrs ago  . Alcohol Use: No    Review of Systems  Constitutional: Positive for fever and appetite change. Negative for activity change.  Eyes: Negative for pain.  Respiratory: Negative for chest tightness and shortness of breath.   Cardiovascular: Negative for chest pain and leg swelling.  Gastrointestinal: Positive for nausea and abdominal pain. Negative for vomiting and diarrhea.  Genitourinary: Positive for dysuria and flank pain.  Musculoskeletal: Negative for back pain and neck stiffness.  Skin: Negative for rash.  Neurological: Negative for weakness, numbness and headaches.  Psychiatric/Behavioral: Negative for behavioral problems.      Allergies  Penicillins; Claritin; Clindamycin/lincomycin; Morphine and related; and Imipenem  Home Medications   Prior to Admission  medications   Medication Sig Start Date End Date Taking? Authorizing Provider  aspirin 81 MG chewable tablet Chew 81 mg by mouth daily. 06/06/12  Yes Ivan Anchors Love, PA-C  diphenoxylate-atropine (LOMOTIL) 2.5-0.025 MG per tablet Take 1 tablet by mouth 4 (four) times daily. 05/12/14  Yes Rogene Houston, MD  mesalamine (CANASA) 1000 MG suppository Place 1 suppository (1,000 mg total) rectally at bedtime. 05/19/14  Yes Rogene Houston, MD  mesalamine (PENTASA) 500 MG CR capsule Take 2 capsules (1,000 mg total) by mouth 2 (two) times daily. Patient takes 2 by mouth twice daily 03/18/14  Yes Rogene Houston, MD  Multiple Vitamin (MULTIVITAMIN) LIQD Take 5 mLs by mouth daily. 06/06/12  Yes Ivan Anchors Love, PA-C  oxyCODONE-acetaminophen (PERCOCET/ROXICET) 5-325 MG per tablet Take 1 tablet by mouth every 4 (four) hours as needed for moderate pain or severe pain.  07/15/14  Yes  Historical Provider, MD  saccharomyces boulardii (FLORASTOR) 250 MG capsule Take 250 mg by mouth daily.   Yes Historical Provider, MD  cholestyramine Lucrezia Starch) 4 G packet Take 1 packet (4 g total) by mouth 2 (two) times daily. Patient not taking: Reported on 07/16/2014 06/05/14   Rogene Houston, MD  clobetasol cream (TEMOVATE) 6.19 % Apply 1 application topically 2 (two) times daily. Apply to area with skin rash twice daily until rash resolved Patient not taking: Reported on 07/16/2014 03/18/14   Rogene Houston, MD   BP 100/55 mmHg  Pulse 89  Temp(Src) 99 F (37.2 C) (Oral)  Resp 16  Ht 5' 9"  (1.753 m)  Wt 161 lb (73.029 kg)  BMI 23.76 kg/m2  SpO2 100% Physical Exam  Constitutional: He appears well-developed.  HENT:  Head: Atraumatic.  Eyes: Pupils are equal, round, and reactive to light.  Neck: Neck supple.  Cardiovascular: Regular rhythm.   Slight tachycardia  Pulmonary/Chest: Effort normal.  Abdominal: Soft. There is tenderness.  Moderate left-sided tenderness. No masses.  Musculoskeletal: He exhibits no edema.   Neurological: He is alert.  Skin: Skin is warm.    ED Course  Procedures (including critical care time) Labs Review Labs Reviewed  CBC WITH DIFFERENTIAL/PLATELET - Abnormal; Notable for the following:    Platelets 107 (*)    All other components within normal limits  URINALYSIS, ROUTINE W REFLEX MICROSCOPIC (NOT AT [facility]) - Abnormal; Notable for the following:    Color, Urine AMBER (*)    APPearance TURBID (*)    Hgb urine dipstick LARGE (*)    Bilirubin Urine SMALL (*)    Ketones, ur 15 (*)    Protein, ur 100 (*)    All other components within normal limits  URINE MICROSCOPIC-ADD ON - Abnormal; Notable for the following:    Bacteria, UA MANY (*)    Casts GRANULAR CAST (*)    All other components within normal limits  I-STAT CHEM 8, ED - Abnormal; Notable for the following:    Sodium 130 (*)    Chloride 97 (*)    Creatinine, Ser 1.60 (*)    Calcium, Ion 1.06 (*)    All other components within normal limits  URINE CULTURE  CULTURE, BLOOD (ROUTINE X 2)  CULTURE, BLOOD (ROUTINE X 2)  GENTAMICIN LEVEL, RANDOM    Imaging Review No results found.   EKG Interpretation None      MDM   Final diagnoses:  Nephrolithiasis  Acute pyelonephritis    Patient with nephrolithiasis and fever. Has apparent UTI. Has been on antibodies. Sent in by urology, who will admit the patient to take for cystoscopy and stenting.    Davonna Belling, MD 07/16/14 [phone]

## 2014-07-16 NOTE — Anesthesia Procedure Notes (Signed)
Procedure Name: Intubation Date/Time: 07/16/2014 7:43 PM Performed by: Ofilia Neas Pre-anesthesia Checklist: Patient identified, Emergency Drugs available, Suction available, Patient being monitored and Timeout performed Patient Re-evaluated:Patient Re-evaluated prior to inductionOxygen Delivery Method: Circle system utilized Preoxygenation: Pre-oxygenation with 100% oxygen Intubation Type: IV induction and Rapid sequence Laryngoscope Size: Mac and 4 Grade View: Grade II Tube type: Oral Tube size: 7.5 mm Number of attempts: 1 Airway Equipment and Method: Stylet Placement Confirmation: ETT inserted through vocal cords under direct vision,  positive ETCO2 and breath sounds checked- equal and bilateral Secured at: 21 cm Tube secured with: Tape Dental Injury: Teeth and Oropharynx as per pre-operative assessment

## 2014-07-16 NOTE — Brief Op Note (Signed)
07/16/2014  8:08 PM  PATIENT:  Connor Small  54 y.o. male  PRE-OPERATIVE DIAGNOSIS:  LEFT URETERAL OBSTRUCTION, urosepsis  POST-OPERATIVE DIAGNOSIS:  LEFT URETERAL OBSTRUCTION, urosepsis  PROCEDURE:  Procedure(s): CYSTOSCOPY WITH RETROGRADE PYELOGRAM,  AND LEFT STENT PLACEMENT (Left)  SURGEON:  Surgeon(s) and Role:    * Alexis Frock, MD - Primary  PHYSICIAN ASSISTANT:   ASSISTANTS: none   ANESTHESIA:   general  EBL:     BLOOD ADMINISTERED:none  DRAINS: none   LOCAL MEDICATIONS USED:  NONE  SPECIMEN:  No Specimen  DISPOSITION OF SPECIMEN:  N/A  COUNTS:  YES  TOURNIQUET:  * No tourniquets in log *  DICTATION: .Other Dictation: Dictation Number [id number]  PLAN OF CARE: Admit to inpatient   PATIENT DISPOSITION:  PACU - hemodynamically stable.   Delay start of Pharmacological VTE agent (>24hrs) due to surgical blood loss or risk of bleeding: not applicable

## 2014-07-16 NOTE — Progress Notes (Signed)
Notified Connor Small in [place] on elevated temp of 102.9. She will pass the info to Dr. Tresa Moore.

## 2014-07-16 NOTE — ED Notes (Signed)
Patient was sent by Dr. Tammi Klippel and was told he had a 14 mm obstructing stone on the left. Patient has stones bilaterally. Patient states he ws told he would need a stent.

## 2014-07-16 NOTE — H&P (Signed)
Connor Small is an 54 y.o. male.    Chief Complaint: Fever, left renal stone with hydronephrosis  HPI:   1 - Fever, left renal stone with hydronephrosis - left mid 1.4cm renal stone with upper pole focal hydro by ER CT [id number]/7739 on eval colickly flank pain. Some bacteruria noted and on empric bactrim. AM 6/22 developed fever to 102 and more malaise worriseom for impending sepsis.  Last meal yesterday.  Past Medical History  Diagnosis Date  . Chronic diarrhea   . Rectal bleed   . Hemorrhoids   . Ulcerative colitis     Distal UC  . 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 PAF (paroxysmal atrial fibrillation) March 2014   . Perforation bowel 2013    sigmoid colon  . Kidney stones     Past Surgical History  Procedure Laterality Date  . Colonoscopy  9/03  . Sigmoidoscopy       06/13/2002  . Temporary ostomy November 06, 2010      for a colon perforation that was done in [facility] (Dr Payton Doughty).    . Colonoscopy  02/16/2011    Procedure: COLONOSCOPY;  Surgeon: Rogene Houston, MD;  [facility];  Service: Endoscopy;  Laterality: N/A;  100  . Flexible sigmoidoscopy  02/27/2012    Procedure: FLEXIBLE SIGMOIDOSCOPY;  Surgeon: Rogene Houston, MD;  [facility];  Service: Endoscopy;  Laterality: N/A;  with colonic decompression  . Laparotomy  02/27/2012    Procedure: EXPLORATORY LAPAROTOMY;  Surgeon: Donato Heinz, MD;  [facility];  Service: General;  Laterality: N/A;  . Cecostomy  02/27/2012    Procedure: CECOSTOMY;  Surgeon: Donato Heinz, MD;  [facility];  Service: General;  Laterality: N/A;  Cecostomy Tube Placement  . Colectomy  02/28/2012    Procedure: TOTAL COLECTOMY;  Surgeon: Donato Heinz, MD;  [facility];  Service: General;  Laterality: N/A;  . Ileostomy  02/28/2012    Procedure: ILEOSTOMY;  Surgeon: Donato Heinz, MD;  [facility];  Service:  General;  Laterality: N/A;  . Cystoscopy w/ ureteral stent placement Bilateral 03/20/2012    Procedure: CYSTOSCOPY WITH RETROGRADE PYELOGRAM/URETERAL STENT PLACEMENT;  Surgeon: Alexis Frock, MD;  Location: [place];  Service: Urology;  Laterality: Bilateral. Pt reports no stents were placed.  . Laparotomy N/A 03/26/2012    Procedure: EXPLORATORY LAPAROTOMY;  Surgeon: Edward Jolly, MD;  Location: [place];  Service: General;  Laterality: N/A;  . Application of wound vac N/A 03/26/2012    Procedure: APPLICATION OF WOUND VAC;  Surgeon: Edward Jolly, MD;  [facility];  Service: General;  Laterality: N/A;  . Liver biopsy N/A 03/26/2012    Procedure: LIVER BIOPSY;  Surgeon: Edward Jolly, MD;  Location: [place];  Service: General;  Laterality: N/A;  . Laparotomy N/A 03/29/2012    Procedure: EXPLORATORY LAPAROTOMY, PARTIAL WOUND CLOSURE;  Surgeon: Edward Jolly, MD;  Location: [place];  Service: General;  Laterality: N/A;  . Vacuum assisted closure change N/A 03/29/2012    Procedure: Open ABDOMINAL VACUUM  CHANGE;  Surgeon: Edward Jolly, MD;  Location: [place];  Service: General;  Laterality: N/A;  . Vacuum assisted closure change N/A 04/01/2012    Procedure: removal of abdominal vac dressing and abdominal closure;  Surgeon: Edward Jolly, MD;  Location:  [facility];  Service: General;  Laterality: N/A;  . Flexible sigmoidoscopy N/A 02/06/2013    Procedure: FLEXIBLE SIGMOIDOSCOPY;  Surgeon: Rogene Houston, MD;  [facility];  Service: Endoscopy;  Laterality: N/A;  100-moved to 1200 Ann notified pt  . Flexible sigmoidoscopy N/A 03/27/2013    Procedure: FLEXIBLE SIGMOIDOSCOPY;  Surgeon: Rogene Houston, MD;  [facility];  Service: Endoscopy;  Laterality: N/A;  225  . Ileostomy closure N/A 05/27/2013    Procedure: ILEOSTOMY TAKEDOWN WITH ILEOPROCTOSTOMY;  Surgeon: Edward Jolly, MD;  [facility];  Service: General;  Laterality: N/A;  . Flexible sigmoidoscopy N/A  12/11/2013    Procedure: FLEXIBLE SIGMOIDOSCOPY;  Surgeon: Rogene Houston, MD;  [facility];  Service: Endoscopy;  Laterality: N/A;  200    Family History  Problem Relation Age of Onset  . Cancer Sister   . Healthy Daughter   . Hypertension Mother   . Colon cancer Neg Hx    Social History:  reports that he quit smoking about 30 years ago. His smoking use included Cigarettes. He has a 6 pack-year smoking history. He has never used smokeless tobacco. He reports that he does not drink alcohol or use illicit drugs.  Allergies:  Allergies  Allergen Reactions  . Penicillins Other (See Comments)    Heart rate changes  . Claritin [Loratadine]     Rash  . Clindamycin/Lincomycin Diarrhea    C-diff  . Morphine And Related     Nausea/vomiting  . Imipenem Rash    Primaxin antibiotic     (Not in a hospital admission)  Results for orders placed or performed during the hospital encounter of 07/16/14 (from the past 48 hour(s))  Urinalysis, Routine w reflex microscopic (not at [facility])     Status: Abnormal   Collection Time: 07/16/14 10:15 AM  Result Value Ref Range   Color, Urine AMBER (A) YELLOW    Comment: BIOCHEMICALS MAY BE AFFECTED BY COLOR   APPearance TURBID (A) CLEAR   Specific Gravity, Urine 1.016 1.005 - 1.030   pH 6.0 5.0 - 8.0   Glucose, UA NEGATIVE NEGATIVE mg/dL   Hgb urine dipstick LARGE (A) NEGATIVE   Bilirubin Urine SMALL (A) NEGATIVE   Ketones, ur 15 (A) NEGATIVE mg/dL   Protein, ur 100 (A) NEGATIVE mg/dL   Urobilinogen, UA 0.2 0.0 - 1.0 mg/dL   Nitrite NEGATIVE NEGATIVE   Leukocytes, UA NEGATIVE NEGATIVE  Urine microscopic-add on     Status: Abnormal   Collection Time: 07/16/14 10:15 AM  Result Value Ref Range   Squamous Epithelial / LPF RARE RARE   WBC, UA 3-6 <3 WBC/hpf   RBC / HPF 21-50 <3 RBC/hpf   Bacteria, UA MANY (A) RARE   Casts GRANULAR CAST (A) NEGATIVE   Urine-Other MUCOUS PRESENT   CBC with Differential     Status: Abnormal    Collection Time: 07/16/14 10:28 AM  Result Value Ref Range   WBC 5.7 4.0 - 10.5 K/uL   RBC 4.54 4.22 - 5.81 MIL/uL   Hemoglobin 13.6 13.0 - 17.0 g/dL   HCT 40.9 39.0 - 52.0 %   MCV 90.1 78.0 - 100.0 fL   MCH 30.0 26.0 - 34.0 pg   MCHC 33.3 30.0 - 36.0 g/dL   RDW 13.9 11.5 - 15.5 %   Platelets 107 (L) 150 - 400 K/uL    Comment: SPECIMEN CHECKED FOR CLOTS REPEATED TO VERIFY PLATELET COUNT CONFIRMED BY SMEAR  Neutrophils Relative % 74 43 - 77 %   Lymphocytes Relative 19 12 - 46 %   Monocytes Relative 6 3 - 12 %   Eosinophils Relative 0 0 - 5 %   Basophils Relative 1 0 - 1 %   Neutro Abs 4.2 1.7 - 7.7 K/uL   Lymphs Abs 1.1 0.7 - 4.0 K/uL   Monocytes Absolute 0.3 0.1 - 1.0 K/uL   Eosinophils Absolute 0.0 0.0 - 0.7 K/uL   Basophils Absolute 0.1 0.0 - 0.1 K/uL   WBC Morphology VACUOLATED NEUTROPHILS   I-stat Chem 8, ED     Status: Abnormal   Collection Time: 07/16/14 10:34 AM  Result Value Ref Range   Sodium 130 (L) 135 - 145 mmol/L   Potassium 4.0 3.5 - 5.1 mmol/L   Chloride 97 (L) 101 - 111 mmol/L   BUN 16 6 - 20 mg/dL   Creatinine, Ser 1.60 (H) 0.61 - 1.24 mg/dL   Glucose, Bld 89 65 - 99 mg/dL   Calcium, Ion 1.06 (L) 1.12 - 1.23 mmol/L   TCO2 21 0 - 100 mmol/L   Hemoglobin 15.0 13.0 - 17.0 g/dL   HCT 44.0 39.0 - 52.0 %   No results found.  Review of Systems  Constitutional: Positive for fever, chills and malaise/fatigue.  HENT: Negative.   Eyes: Negative.   Respiratory: Negative.   Cardiovascular: Negative.   Gastrointestinal: Positive for nausea. Negative for vomiting.  Genitourinary: Positive for flank pain.  Musculoskeletal: Negative.   Skin: Negative.   Neurological: Negative.   Endo/Heme/Allergies: Negative.   Psychiatric/Behavioral: Negative.     Blood pressure 100/63, pulse 102, temperature 98.9 F (37.2 C), temperature source Oral, resp. rate 16, SpO2 100 %. Physical Exam  Constitutional: He appears well-developed.  Wife at bedside  HENT:  Head:  Normocephalic.  Eyes: Pupils are equal, round, and reactive to light.  Cardiovascular:  Regular tachycardia  Respiratory: Effort normal.  GI: Soft.  Genitourinary:  Mild left CVAT  Musculoskeletal: Normal range of motion.  Neurological: He is alert.  Skin: Skin is warm.  Psychiatric: He has a normal mood and affect. His behavior is normal. Thought content normal.     Assessment/Plan  1 - Fever, left renal stone with hydronephrosis - Rec admission for IV hydration + IV ABX + cysto left ureteral stent for left renal decompression. Will try to arrange for later today. Agree with IV Cipro and will contiue as well as add IV gent per renal function awaiting new final CX.   Risks, benefits, alternatives and goals of procedure and admission discussed. Ideally pt fever free x 24 hrs before DC. Will need definitive stone management in elective setting.   Lilton Pare 07/16/2014, 11:09 AM

## 2014-07-16 NOTE — Anesthesia Postprocedure Evaluation (Signed)
  Anesthesia Post-op Note  Patient: Connor Small  Procedure(s) Performed: Procedure(s): CYSTOSCOPY WITH RETROGRADE PYELOGRAM,  AND LEFT STENT PLACEMENT (Left)  Patient Location: PACU  Anesthesia Type:General  Level of Consciousness: awake  Airway and Oxygen Therapy: Patient Spontanous Breathing  Post-op Pain: mild  Post-op Assessment: Post-op Vital signs reviewed              Post-op Vital Signs: Reviewed  Last Vitals:  Filed Vitals:   07/16/14 2030  BP: 109/57  Pulse: 84  Temp:   Resp: 13    Complications: No apparent anesthesia complications

## 2014-07-16 NOTE — ED Notes (Signed)
I attempted to collect blood cultures and was unsuccessful.  I made nurse aware.

## 2014-07-16 NOTE — Transfer of Care (Signed)
Immediate Anesthesia Transfer of Care Note  Patient: Connor Small  Procedure(s) Performed: Procedure(s): CYSTOSCOPY WITH RETROGRADE PYELOGRAM,  AND LEFT STENT PLACEMENT (Left)  Patient Location: PACU  Anesthesia Type:General  Level of Consciousness: awake, alert , oriented and patient cooperative  Airway & Oxygen Therapy: Patient Spontanous Breathing and Patient connected to face mask oxygen  Post-op Assessment: Report given to RN, Post -op Vital signs reviewed and stable and Patient moving all extremities  Post vital signs: Reviewed and stable  Last Vitals:  Filed Vitals:   07/16/14 1850  BP:   Pulse:   Temp: 39.4 C  Resp:     Complications: No apparent anesthesia complications

## 2014-07-16 NOTE — ED Notes (Signed)
Pt will get cultures on the floor per RN...klj

## 2014-07-17 ENCOUNTER — Encounter (HOSPITAL_COMMUNITY): Payer: Self-pay | Admitting: Urology

## 2014-07-17 LAB — URINE CULTURE: Culture: 3000

## 2014-07-17 LAB — GENTAMICIN LEVEL, RANDOM: Gentamicin Rm: 6.7 ug/mL

## 2014-07-17 MED ORDER — ACETAMINOPHEN 325 MG PO TABS
650.0000 mg | ORAL_TABLET | Freq: Four times a day (QID) | ORAL | Status: DC | PRN
  Administered 2014-07-17 – 2014-07-19 (×4): 650 mg via ORAL
  Filled 2014-07-17 (×4): qty 2

## 2014-07-17 MED ORDER — GENTAMICIN SULFATE 40 MG/ML IJ SOLN
500.0000 mg | INTRAMUSCULAR | Status: DC
  Administered 2014-07-18: 500 mg via INTRAVENOUS
  Filled 2014-07-17: qty 12.5

## 2014-07-17 NOTE — Progress Notes (Signed)
Paged MD/PA on call at 1710 about patients oral temp of 103.0 at 1700.   Dr. Alyson Ingles returned page at 1730, & patient information was given.   MD placed new orders to insert foley catheter and give tylenol.

## 2014-07-17 NOTE — Care Management Note (Signed)
Case Management Note  Patient Details  Name: Connor Small MRN: [id number] Date of Birth: 1960/07/18  Subjective/Objective:   54 y/o m admitted w/L renal stone.s/p cystoscopy, stent. From home.                 Action/Plan:d/c plan home.   Expected Discharge Date:   (unknown)               Expected Discharge Plan:  Home/Self Care  In-House Referral:     Discharge planning Services  CM Consult  Post Acute Care Choice:    Choice offered to:     DME Arranged:    DME Agency:     HH Arranged:    HH Agency:     Status of Service:  In process, will continue to follow  Medicare Important Message Given:    Date Medicare IM Given:    Medicare IM give by:    Date Additional Medicare IM Given:    Additional Medicare Important Message give by:     If discussed at [place] of Stay Meetings, dates discussed:    Additional Comments:  Dessa Phi, RN 07/17/2014, 3:45 PM

## 2014-07-17 NOTE — Progress Notes (Signed)
1 Day Post-Op  Subjective:  1 - Fever, left renal stone with hydronephrosis - s/p cysto left stent 07/16/14 for 1.4cm renal stone with upper pole focal hydro by ER CT 01/1171 on eval colickly flank pain in setting of clinical urosepsis. UCX 6/20 from Urol office negative. Repeat UCX / BCX 6/22 this admission pending.  On empiric Cipro + Gentamicin.  Today Connor Small is stable. Some stent colic as expected. Fever curve and tachycardia trending down.    Objective: Vital signs in last 24 hours: Temp:  [98 F (36.7 C)-102.9 F (39.4 C)] 98 F (36.7 C) (06/22 2329) Pulse Rate:  [65-91] 67 (06/22 2238) Resp:  [13-19] 19 (06/22 2238) BP: (91-111)/(48-59) 109/56 mmHg (06/22 2238) SpO2:  [97 %-100 %] 99 % (06/22 2238) Weight:  [73.029 kg (161 lb)] 73.029 kg (161 lb) (06/22 1134) Last BM Date: 07/16/14  Intake/Output from previous day: 06/22 0701 - 06/23 0700 In: 4577.1 [P.O.:600; I.V.:3577.1; IV Piggyback:400] Out: 1000 [Urine:1000] Intake/Output this shift:    General appearance: alert, cooperative, appears stated age and family at bedside Eyes: negative Neck: supple, symmetrical, trachea midline Back: mild left CVAT and bladder SP TTP.  Resp: non-labored on room air Cardio: nl rate GI: soft, non-tender; bowel sounds normal; no masses,  no organomegaly Male genitalia: normal Extremities: extremities normal, atraumatic, no cyanosis or edema Pulses: 2+ and symmetric Skin: Skin color, texture, turgor normal. No rashes or lesions Lymph nodes: Cervical, supraclavicular, and axillary nodes normal. Neurologic: Grossly normal  Lab Results:   Recent Labs  07/16/14 1028 07/16/14 1034  WBC 5.7  --   HGB 13.6 15.0  HCT 40.9 44.0  PLT 107*  --    BMET  Recent Labs  07/16/14 1034  NA 130*  K 4.0  CL 97*  GLUCOSE 89  BUN 16  CREATININE 1.60*   PT/INR No results for input(s): LABPROT, INR in the last 72 hours. ABG No results for input(s): PHART, HCO3 in the last 72  hours.  Invalid input(s): PCO2, PO2  Studies/Results: No results found.  Anti-infectives: Anti-infectives    Start     Dose/Rate Route Frequency Ordered Stop   07/18/14 0200  gentamicin (GARAMYCIN) 500 mg in dextrose 5 % 100 mL IVPB     500 mg 112.5 mL/hr over 60 Minutes Intravenous Every 36 hours 07/17/14 0942     07/16/14 1200  gentamicin (GARAMYCIN) 500 mg in dextrose 5 % 100 mL IVPB     500 mg 112.5 mL/hr over 60 Minutes Intravenous  Once 07/16/14 1135 07/16/14 1448   07/16/14 1115  ciprofloxacin (CIPRO) IVPB 400 mg     400 mg 200 mL/hr over 60 Minutes Intravenous Every 12 hours 07/16/14 1108     07/16/14 1015  ciprofloxacin (CIPRO) IVPB 400 mg     400 mg 200 mL/hr over 60 Minutes Intravenous  Once 07/16/14 1011 07/16/14 1147      Assessment/Plan:  1 - Fever, left renal stone with hydronephrosis - doing well POD 1 form cysto stent. Remain on current empiric ABX and in house until afebrile x 24 hours. Will keep in house today / tonight.  [name], [name] 07/17/2014

## 2014-07-17 NOTE — Progress Notes (Signed)
ANTIBIOTIC CONSULT NOTE - INITIAL  Pharmacy Consult for Gentamicin Indication: rule out sepsis  Allergies  Allergen Reactions  . Penicillins Other (See Comments)    Heart rate changes  . Claritin [Loratadine]     Rash  . Clindamycin/Lincomycin Diarrhea    C-diff  . Morphine And Related     Nausea/vomiting  . Imipenem Rash    Primaxin antibiotic    Patient Measurements: Height: 5' 9"  (175.3 cm) Weight: 161 lb (73.029 kg) IBW/kg (Calculated) : 70.7  Vital Signs: Temp: 98 F (36.7 C) (06/22 2329) Temp Source: Oral (06/22 2329) BP: 109/56 mmHg (06/22 2238) Pulse Rate: 67 (06/22 2238) Intake/Output from previous day: 06/22 0701 - 06/23 0700 In: 4577.1 [P.O.:600; I.V.:3577.1; IV Piggyback:400] Out: 1000 [Urine:1000] Intake/Output from this shift:    Labs:  Recent Labs  07/16/14 1028 07/16/14 1034  WBC 5.7  --   HGB 13.6 15.0  PLT 107*  --   CREATININE  --  1.60*   Estimated Creatinine Clearance: 51.6 mL/min (by C-G formula based on Cr of 1.6).  Recent Labs  07/17/14 0110  GENTRANDOM 6.7     Microbiology: Recent Results (from the past 720 hour(s))  Surgical pcr screen     Status: None   Collection Time: 07/16/14  6:16 PM  Result Value Ref Range Status   MRSA, PCR NEGATIVE NEGATIVE Final   Staphylococcus aureus NEGATIVE NEGATIVE Final    Comment:        The Xpert SA Assay (FDA approved for NASAL specimens in patients over 55 years of age), is one component of a comprehensive surveillance program.  Test performance has been validated by [facility] for patients greater than or equal to 91 year old. It is not intended to diagnose infection nor to guide or monitor treatment.     Medical History: Past Medical History  Diagnosis Date  . Chronic diarrhea   . Rectal bleed   . Hemorrhoids   . Ulcerative colitis     Distal UC  . 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 PAF (paroxysmal atrial fibrillation) March 2014   . Perforation bowel 2013    sigmoid colon  . Kidney stones     Assessment: 54 yo male with left renal stone and hydronephrosis. Presents to ED with fever, malaise and concern for sepsis. MD has started IV cipro and Pharmacy is consulted to dose gentamicin. Patient is now s/p cystoscopy and insertion of L ureteral stent.  6/22 >> Cipro >> 6/22 >> Gent >>  6/22 blood x 2: sent 6/22 urine: sent 6/22 MRSA PCR: negative  6/23 0110 Gentamicin level (~ 9.5h level) = 6.7 mcg/mL  Goal of Therapy:  Gentamicin dose adjusted per [facility] Nomogram Appropriate antibiotic dosing for renal function and indication Eradication of infection  Plan:   Continue Gentamicin 527m (~7 mg/kg) IV q36h per [place] Nomogram.  Cipro dose appropriate for renal function/indication.  F/U BMET in AM. Follow up renal function & cultures, clinical course   JLindell Spar PharmD, BCPS Pager: [phone]/23/2016 9:41 AM

## 2014-07-17 NOTE — Op Note (Signed)
Small, [name] NO.:  [id number]  MEDICAL RECORD NO.:  [id number]  LOCATION:  1418                         FACILITY:  [facility]  PHYSICIAN:  Sebastian Ache, MD     DATE OF BIRTH:  17-Aug-1960  DATE OF PROCEDURE:  07/16/2014                               OPERATIVE REPORT   DIAGNOSES:  Left renal stone with fevers, bacteriuria, urosepsis.  PROCEDURE: 1. Cystoscopy with left retrograde pyelogram and interpretation. 2. Insertion of left ureteral stent 6 x 26, contour, no tether.  ESTIMATED BLOOD LOSS:  Nil.  COMPLICATIONS:  None.  SPECIMEN:  None.  FINDINGS: 1. Impacted left upper pole infundibular stone with upper pole focal     hydronephrosis. 2. Successful placement of left ureteral stent across, obstructing     left upper pole stone in proximal end and dilated upper pole distal     and urinary bladder.  INDICATION:  [name] is a very pleasant 54 year old gentleman with history of malaise and colicky left flank pain.  He was found on evaluation to have a left upper pole infundibular stone.  He presented to the office yesterday and had defervesced after antibiotics and was not found to be acutely ill and tentatively scheduled for selective ureteroscopic manipulation of the stone.  The strong precaution is to contact us should he begin to have recurrent fevers.  He contacted [place] this morning that his fevers have recurred.  He was counseled to present to [facility] emergency room urgently, which he did.  He was admitted. Intravenous antibiotics began according to most recent cultures.  He was counseled towards urgent renal decompression.  We discussed nephrostomy versus stenting and he wished to proceed with attempt of the latter. Informed consent was obtained and placed in medical record.  PROCEDURE IN DETAIL:  The patient being Connor Small, was verified and his procedure being left ureteral stent placement was confirmed. Procedure was carried out.   Time-out was performed.  Intravenous antibiotics were administered.  The patient was placed into a low lithotomy position.  Sterile field was created by prepping and draping the patient's penis, perineum, and proximal thighs using iodine x3. Next, cystourethroscopy was performed using a 23-French rigid cystoscope with 30-degree offset lens.  Inspection of the anterior and posterior urethra unremarkable.  Inspection of bladder revealed no diverticula, calcifications, or papillary lesions.  The left ureter was cannulated with a 6-French end-hold catheter and left retrograde pyelogram was obtained.  Left retrograde pyelogram demonstrated a single left ureter, single system left kidney.  There was focal upper pole hydronephrosis without ureteral nephrosis.  Multiple filling defects along the course of the upper pole infundibulum consistent with known stone.  Given the angulation that we needed to place a wire and stent across his upper pole stone, the open-ended catheter was exchanged via a Sensor wire over the KMP angled-tip catheter, which yet in the sensor wire were very carefully navigated such that the wire access was achieved radiographically and across the upper pole, infundibular stone that was obstructing and coiled into the upper pole of the kidney.  The KMP catheter was then exchanged for a new 6 x 26 Contour-type stent with the proximal  end coiled in the upper pole and dilated calyx lower end of the urinary bladder.  There was efflux of copious proteinaceous appearing urine also suggesting adequate placement of stent.  Bladder was emptied per cystoscope.  Procedure was then terminated.  The patient tolerated the procedure well.  There were no immediate periprocedural complications.  The patient was taken to the postanesthesia care unit in stable condition.          ______________________________ Sebastian Ache, MD     TM/MEDQ  D:  07/16/2014  T:  07/17/2014  Job:   [id number]

## 2014-07-18 ENCOUNTER — Inpatient Hospital Stay (HOSPITAL_COMMUNITY): Payer: BLUE CROSS/BLUE SHIELD

## 2014-07-18 LAB — BASIC METABOLIC PANEL
ANION GAP: 8 (ref 5–15)
BUN: 20 mg/dL (ref 6–20)
CHLORIDE: 104 mmol/L (ref 101–111)
CO2: 22 mmol/L (ref 22–32)
CREATININE: 1.93 mg/dL — AB (ref 0.61–1.24)
Calcium: 7.4 mg/dL — ABNORMAL LOW (ref 8.9–10.3)
GFR calc Af Amer: 43 mL/min — ABNORMAL LOW (ref >60)
GFR calc non Af Amer: 37 mL/min — ABNORMAL LOW (ref >60)
GLUCOSE: 112 mg/dL — AB (ref 65–99)
Potassium: 4 mmol/L (ref 3.5–5.1)
Sodium: 134 mmol/L — ABNORMAL LOW (ref 135–145)

## 2014-07-18 LAB — CBC
HCT: 34.9 % — ABNORMAL LOW (ref 39.0–52.0)
HEMOGLOBIN: 11.4 g/dL — AB (ref 13.0–17.0)
MCH: 28.9 pg (ref 26.0–34.0)
MCHC: 32.7 g/dL (ref 30.0–36.0)
MCV: 88.4 fL (ref 78.0–100.0)
Platelets: 54 10*3/uL — ABNORMAL LOW (ref 150–400)
RBC: 3.95 MIL/uL — ABNORMAL LOW (ref 4.22–5.81)
RDW: 14.3 % (ref 11.5–15.5)
WBC: 6.7 10*3/uL (ref 4.0–10.5)

## 2014-07-18 LAB — GENTAMICIN LEVEL, RANDOM: Gentamicin Rm: 10.2 ug/mL

## 2014-07-18 NOTE — Progress Notes (Signed)
Patient had a fever of 103F.  MD on call was notified. Awaiting a return call.

## 2014-07-18 NOTE — Progress Notes (Signed)
ANTIBIOTIC CONSULT NOTE - FOLLOW UP  Pharmacy Consult for Gentamicin Indication: urosepsis  Allergies  Allergen Reactions  . Penicillins Other (See Comments)    Heart rate changes  . Claritin [Loratadine]     Rash  . Clindamycin/Lincomycin Diarrhea    C-diff  . Morphine And Related     Nausea/vomiting  . Imipenem Rash    Primaxin antibiotic    Patient Measurements: Height: 5' 9"  (175.3 cm) Weight: 161 lb (73.029 kg) IBW/kg (Calculated) : 70.7  Vital Signs: Temp: 97.7 F (36.5 C) (06/24 1357) Temp Source: Oral (06/24 1357) BP: 85/50 mmHg (06/24 1420) Pulse Rate: 72 (06/24 1420) Intake/Output from previous day: 06/23 0701 - 06/24 0700 In: 2377.5 [P.O.:240; I.V.:1625; IV Piggyback:512.5] Out: 950 [Urine:950] Intake/Output from this shift: Total I/O In: -  Out: 400 [Urine:400]  Labs:  Recent Labs  07/16/14 1028 07/16/14 1034 07/18/14 0501  WBC 5.7  --  6.7  HGB 13.6 15.0 11.4*  PLT 107*  --  54*  CREATININE  --  1.60* 1.93*   Estimated Creatinine Clearance: 42.7 mL/min (by C-G formula based on Cr of 1.93).  Recent Labs  07/17/14 0110 07/18/14 1220  GENTRANDOM 6.7 10.2     Microbiology: Recent Results (from the past 720 hour(s))  Urine culture     Status: None   Collection Time: 07/16/14 10:15 AM  Result Value Ref Range Status   Specimen Description URINE, CLEAN CATCH  Final   Special Requests NONE  Final   Culture   Final    3,000 COLONIES/mL INSIGNIFICANT GROWTH Performed at [facility]    Report Status 07/17/2014 FINAL  Final  Culture, blood (routine x 2)     Status: None (Preliminary result)   Collection Time: 07/16/14 12:30 PM  Result Value Ref Range Status   Specimen Description BLOOD RIGHT HAND  Final   Special Requests BOTTLES DRAWN AEROBIC AND ANAEROBIC 10CC  Final   Culture   Final    NO GROWTH < 24 HOURS Performed at [facility]    Report Status PENDING  Incomplete  Culture, blood (routine x 2)     Status: None  (Preliminary result)   Collection Time: 07/16/14 12:40 PM  Result Value Ref Range Status   Specimen Description BLOOD RIGHT ARM  Final   Special Requests BOTTLES DRAWN AEROBIC ONLY 10CC  Final   Culture   Final    NO GROWTH < 24 HOURS Performed at [facility]    Report Status PENDING  Incomplete  Surgical pcr screen     Status: None   Collection Time: 07/16/14  6:16 PM  Result Value Ref Range Status   MRSA, PCR NEGATIVE NEGATIVE Final   Staphylococcus aureus NEGATIVE NEGATIVE Final    Comment:        The Xpert SA Assay (FDA approved for NASAL specimens in patients over 8 years of age), is one component of a comprehensive surveillance program.  Test performance has been validated by [facility] for patients greater than or equal to 53 year old. It is not intended to diagnose infection nor to guide or monitor treatment.   Culture, blood (routine x 2)     Status: None (Preliminary result)   Collection Time: 07/18/14  9:53 AM  Result Value Ref Range Status   Specimen Description BLOOD RIGHT ANTECUBITAL  Final   Special Requests BOTTLES DRAWN AEROBIC AND ANAEROBIC 10CC  Final   Culture PENDING  Incomplete   Report Status PENDING  Incomplete  Culture, blood (routine x 2)     Status: None (Preliminary result)   Collection Time: 07/18/14 10:01 AM  Result Value Ref Range Status   Specimen Description BLOOD RIGHT ARM  Final   Special Requests BOTTLES DRAWN AEROBIC AND ANAEROBIC 10CC  Final   Culture PENDING  Incomplete   Report Status PENDING  Incomplete    Anti-infectives    Start     Dose/Rate Route Frequency Ordered Stop   07/18/14 0200  gentamicin (GARAMYCIN) 500 mg in dextrose 5 % 100 mL IVPB     500 mg 112.5 mL/hr over 60 Minutes Intravenous Every 36 hours 07/17/14 0942     07/16/14 1200  gentamicin (GARAMYCIN) 500 mg in dextrose 5 % 100 mL IVPB     500 mg 112.5 mL/hr over 60 Minutes Intravenous  Once 07/16/14 1135 07/16/14 1448   07/16/14 1115  ciprofloxacin  (CIPRO) IVPB 400 mg     400 mg 200 mL/hr over 60 Minutes Intravenous Every 12 hours 07/16/14 1108     07/16/14 1015  ciprofloxacin (CIPRO) IVPB 400 mg     400 mg 200 mL/hr over 60 Minutes Intravenous  Once 07/16/14 1011 07/16/14 1147      Assessment: 54 yo male with left renal stone and hydronephrosis. Presents to ED with fever, malaise and concern for sepsis. MD has started IV cipro and Pharmacy is consulted to dose gentamicin. Patient is now s/p cystoscopy and insertion of L ureteral stent.  Gentamicin dose has been reduced due to worsening renal function, but patient continues to appear acutely ill  6/22 >> Cipro (MD) >> 6/22 >> Norva Karvonen >>  Temp: repeated temps to 103 WBC: wnl Renal: SCr increased again today; CrCl now 43 CG  6/22 blood: NTGD 6/22 urine: insignificant growth (final) 6/24 blood: IP 6/24 urine: IP  Levels / dose changes: 6/23 0110 random [place] level (~ 9.5h level) = 6.7 mcg/ml >> changed to q36 hr dosing 6/24 1220 level (9.5 hr) = 10.2   Goal of Therapy:  Gentamicin trough level < 1 (estimated per [place] nomogram) Eradication of infection Appropriate antibiotic dosing for indication and renal function  Plan:  Day 3 antibiotics  Per nomogram, patient no longer qualifies for extended interval dosing.  There is a good chance patient's renal function will continue to decline, so will hold gentamicin and recheck a random level and BMP tomorrow AM.  Follow clinical course, renal function, culture results as available  Follow for de-escalation of antibiotics and LOT   Reuel Boom, PharmD Pager: [phone] 07/18/2014, 3:22 PM

## 2014-07-18 NOTE — Progress Notes (Signed)
2 Days Post-Op  Subjective:  1 Left renal stone with hydronephrosis - s/p cysto left stent 07/16/14 for 1.4cm renal stone with upper pole focal hydro by ER CT 05/567 on eval colickly flank pain in setting of clinical urosepsis.   2 - Fever, Working Diagnosis of Urosepsis - fever 102-103 intermintantly since admission presuambly from urosepsis.  Repeat UCX / BCX 6/22 this admission pending.  UCX 6/20 from Urol office negative. Denis cough, headache, GI/abdominal pain. Presently on empiric Dipro + Gent (pharmcy dosing)  3 - Renal Insufficiency - Cr 1.9 6/25 up from 1.6 on admission. He has had some intermitant hypotension (asymptomatic) and exposure to nephrotoxic meds (gent). Minimla left hydro by CT and now s/p stent.  Today Connor Small remains with intermitant spikig fevers. UCX on admission minimal growth and BCX remain negative. No new localizing symptoms.   Objective: Vital signs in last 24 hours: Temp:  [98.8 F (37.1 C)-103 F (39.4 C)] 98.8 F (37.1 C) (06/24 0511) Pulse Rate:  [72-93] 93 (06/24 0413) Resp:  [16-18] 16 (06/24 0413) BP: (95-107)/(44-56) 95/49 mmHg (06/24 0426) SpO2:  [95 %-99 %] 95 % (06/24 0413) Last BM Date: 07/16/14  Intake/Output from previous day: 06/23 0701 - 06/24 0700 In: 2377.5 [P.O.:240; I.V.:1625; IV Piggyback:512.5] Out: 950 [Urine:950] Intake/Output this shift:    General appearance: alert, cooperative, appears stated age and wife at bedside Head: Normocephalic, without obvious abnormality, atraumatic Nose: Nares normal. Septum midline. Mucosa normal. No drainage or sinus tenderness. Throat: lips, mucosa, and tongue normal; teeth and gums normal Neck: supple, symmetrical, trachea midline Back: symmetric, no curvature. ROM normal. No CVA tenderness. Resp: non-labored on room air. No wheezes or cough.  Cardio: regular rate and rhythm, S1, S2 normal, no murmur, click, rub or gallop GI: soft, non-tender; bowel sounds normal; no masses,  no organomegaly  and prior incisions from bowel surgeyr c/d/i, no hernias or rebound.  Male genitalia: normal, foley in place with dark yellow urine, removed.  Extremities: extremities normal, atraumatic, no cyanosis or edema Pulses: 2+ and symmetric Lymph nodes: Cervical, supraclavicular, and axillary nodes normal. Neurologic: Grossly normal  Lab Results:   Recent Labs  07/16/14 1028 07/16/14 1034 07/18/14 0501  WBC 5.7  --  6.7  HGB 13.6 15.0 11.4*  HCT 40.9 44.0 34.9*  PLT 107*  --  54*   BMET  Recent Labs  07/16/14 1034 07/18/14 0501  NA 130* 134*  K 4.0 4.0  CL 97* 104  CO2  --  22  GLUCOSE 89 112*  BUN 16 20  CREATININE 1.60* 1.93*  CALCIUM  --  7.4*   PT/INR No results for input(s): LABPROT, INR in the last 72 hours. ABG No results for input(s): PHART, HCO3 in the last 72 hours.  Invalid input(s): PCO2, PO2  Studies/Results: No results found.  Anti-infectives: Anti-infectives    Start     Dose/Rate Route Frequency Ordered Stop   07/18/14 0200  gentamicin (GARAMYCIN) 500 mg in dextrose 5 % 100 mL IVPB     500 mg 112.5 mL/hr over 60 Minutes Intravenous Every 36 hours 07/17/14 0942     07/16/14 1200  gentamicin (GARAMYCIN) 500 mg in dextrose 5 % 100 mL IVPB     500 mg 112.5 mL/hr over 60 Minutes Intravenous  Once 07/16/14 1135 07/16/14 1448   07/16/14 1115  ciprofloxacin (CIPRO) IVPB 400 mg     400 mg 200 mL/hr over 60 Minutes Intravenous Every 12 hours 07/16/14 1108     07/16/14  1015  ciprofloxacin (CIPRO) IVPB 400 mg     400 mg 200 mL/hr over 60 Minutes Intravenous  Once 07/16/14 1011 07/16/14 1147      Assessment/Plan:  1 Left renal stone with hydronephrosis - s/p stenting. Will need ureteroscopy in elective setting after over infectious parameters.   2 - Fever, Working Diagnosis of Urosepsis - now s/p renal decompression and on empiric ABX. CXR and KUB today to r/o infiltrates or stent malposition.  If remains febrile another 24 hrs will consider hostpitalist  or ID input.   3 - Renal Insufficiency - likely medical renal from relative hypoperfusion and gent use. Appreciate pharmacy help in gent dosing to minimize nephrotoxic effects. Continue daily BMP.   Remain in house.   [facility], Analaura Messler 07/18/2014

## 2014-07-18 NOTE — Progress Notes (Signed)
RN was asked to call Dr. Alyson Ingles. Will call Dr. Alyson Ingles

## 2014-07-19 LAB — BASIC METABOLIC PANEL
ANION GAP: 6 (ref 5–15)
BUN: 21 mg/dL — ABNORMAL HIGH (ref 6–20)
CO2: 22 mmol/L (ref 22–32)
Calcium: 7.4 mg/dL — ABNORMAL LOW (ref 8.9–10.3)
Chloride: 104 mmol/L (ref 101–111)
Creatinine, Ser: 2.05 mg/dL — ABNORMAL HIGH (ref 0.61–1.24)
GFR calc non Af Amer: 35 mL/min — ABNORMAL LOW (ref >60)
GFR, EST AFRICAN AMERICAN: 40 mL/min — AB (ref >60)
GLUCOSE: 103 mg/dL — AB (ref 65–99)
Potassium: 4.7 mmol/L (ref 3.5–5.1)
Sodium: 132 mmol/L — ABNORMAL LOW (ref 135–145)

## 2014-07-19 LAB — CBC
HCT: 31.6 % — ABNORMAL LOW (ref 39.0–52.0)
Hemoglobin: 10.6 g/dL — ABNORMAL LOW (ref 13.0–17.0)
MCH: 30.1 pg (ref 26.0–34.0)
MCHC: 33.5 g/dL (ref 30.0–36.0)
MCV: 89.8 fL (ref 78.0–100.0)
PLATELETS: 49 10*3/uL — AB (ref 150–400)
RBC: 3.52 MIL/uL — AB (ref 4.22–5.81)
RDW: 14.8 % (ref 11.5–15.5)
WBC: 6.8 10*3/uL (ref 4.0–10.5)

## 2014-07-19 LAB — URINE CULTURE
Culture: NO GROWTH
SPECIAL REQUESTS: NORMAL

## 2014-07-19 LAB — GENTAMICIN LEVEL, RANDOM: Gentamicin Rm: 2.3 ug/mL

## 2014-07-19 MED ORDER — ACETAMINOPHEN 500 MG PO TABS
1000.0000 mg | ORAL_TABLET | Freq: Three times a day (TID) | ORAL | Status: DC
  Administered 2014-07-19 – 2014-07-20 (×3): 1000 mg via ORAL
  Filled 2014-07-19 (×5): qty 2

## 2014-07-19 MED ORDER — SACCHAROMYCES BOULARDII 250 MG PO CAPS
250.0000 mg | ORAL_CAPSULE | Freq: Every day | ORAL | Status: DC
  Administered 2014-07-19 – 2014-07-20 (×2): 250 mg via ORAL
  Filled 2014-07-19 (×2): qty 1

## 2014-07-19 MED ORDER — AZTREONAM 1 G IJ SOLR
1.0000 g | Freq: Three times a day (TID) | INTRAMUSCULAR | Status: DC
  Administered 2014-07-19 – 2014-07-20 (×4): 1 g via INTRAVENOUS
  Filled 2014-07-19 (×5): qty 1

## 2014-07-19 NOTE — Progress Notes (Signed)
3 Days Post-Op  Subjective:  1 Left renal stone with hydronephrosis - s/p cysto left stent 07/16/14 for 1.4cm renal stone with upper pole focal hydro by ER CT 03/2547 on eval colickly flank pain in setting of clinical urosepsis.   2 - Fever, Working Diagnosis of Urosepsis - fever 102-103 intermintantly since admission presuambly from urosepsis.  Repeat UCX / BCX 6/22 this admission pending.  UCX 6/20 from Urol office negative. Denis cough, headache, GI/abdominal pain. Presently on empiric Cipro + Gent (pharmcy dosing). CXR and KUB 6/24 confirm stent in good position and no worrisome infiltrates.   3 - Renal Insufficiency - Cr 1.9 6/25 up from 1.6 on admission. He has had some intermitant hypotension (asymptomatic) and exposure to nephrotoxic meds (gent). Minimla left hydro by CT and now s/p stent. No hyperkalemia.  Today Connor remains with intermitant spikig fevers but peaks are trending down. No localizing symptoms. Overall less malaise.   Objective: Vital signs in last 24 hours: Temp:  [97.7 F (36.5 C)-101.3 F (38.5 C)] 101.3 F (38.5 C) (06/25 0500) Pulse Rate:  [72-95] 92 (06/25 0500) Resp:  [16-18] 18 (06/25 0500) BP: (79-126)/(50-68) 126/68 mmHg (06/25 0500) SpO2:  [95 %-100 %] 98 % (06/25 0500) Last BM Date: 07/18/14  Intake/Output from previous day: 06/24 0701 - 06/25 0700 In: 2775 [I.V.:2375; IV Piggyback:400] Out: 700 [Urine:700] Intake/Output this shift: Total I/O In: 712.5 [I.V.:512.5; IV Piggyback:200] Out: 300 [Urine:300]  General appearance: alert, cooperative, appears stated age and wife at bedside Head: Normocephalic, without obvious abnormality, atraumatic Nose: Nares normal. Septum midline. Mucosa normal. No drainage or sinus tenderness. Throat: lips, mucosa, and tongue normal; teeth and gums normal Neck: supple, symmetrical, trachea midline Back: symmetric, no curvature. ROM normal. No CVA tenderness. Resp: non-labored on room air.  Cardio: mild tachycardia  that is regular GI: soft, non-tender; bowel sounds normal; no masses,  no organomegaly Extremities: extremities normal, atraumatic, no cyanosis or edema Skin: Skin color, texture, turgor normal. No rashes or lesions Lymph nodes: Cervical, supraclavicular, and axillary nodes normal. Neurologic: Grossly normal  Lab Results:   Recent Labs  07/18/14 0501 07/19/14 0455  WBC 6.7 6.8  HGB 11.4* 10.6*  HCT 34.9* 31.6*  PLT 54* 49*   BMET  Recent Labs  07/18/14 0501 07/19/14 0455  NA 134* 132*  K 4.0 4.7  CL 104 104  CO2 22 22  GLUCOSE 112* 103*  BUN 20 21*  CREATININE 1.93* 2.05*  CALCIUM 7.4* 7.4*   PT/INR No results for input(s): LABPROT, INR in the last 72 hours. ABG No results for input(s): PHART, HCO3 in the last 72 hours.  Invalid input(s): PCO2, PO2  Studies/Results: Dg Chest 2 View  07/18/2014   CLINICAL DATA:  Fever.  EXAM: CHEST  2 VIEW  COMPARISON:  10/16/2012  FINDINGS: Previously seen left pleural effusion has resolved. Residual pleural -parenchymal scarring is seen in the left mid and lower lung.  Increased streaky opacity is seen in right lower lung compared with previous study, and this may be due to atelectasis, scarring, or bronchopneumonia. Heart size remains within normal limits. No evidence of congestive heart failure.  IMPRESSION: Increased streaky opacity in right lower lung, which may be due to atelectasis, scarring, or bronchopneumonia. Recommend clinical correlation, and consider chest radiographic followup or chest CT for further evaluation.  Interval resolution of left pleural effusion, with residual left-sided pleural- parenchymal scarring   Electronically Signed   By: Earle Gell M.D.   On: 07/18/2014 08:39  Dg Abd 1 View  07/18/2014   CLINICAL DATA:  Nausea and vomiting.  Constipation.  EXAM: ABDOMEN - 1 VIEW  COMPARISON:  CT 07/04/2014  FINDINGS: Left-sided ureteral stent appears in proper position. Cluster of calcifications along the proximal  aspect of the stent measuring 13 mm at the level of the renal pelvis.  IMPRESSION: 1. Double-J ureteral stent on the left. 2. Proximal right ureterolithiasis.   Electronically Signed   By: Suzy Bouchard M.D.   On: 07/18/2014 08:36    Anti-infectives: Anti-infectives    Start     Dose/Rate Route Frequency Ordered Stop   07/18/14 0200  gentamicin (GARAMYCIN) 500 mg in dextrose 5 % 100 mL IVPB  Status:  Discontinued     500 mg 112.5 mL/hr over 60 Minutes Intravenous Every 36 hours 07/17/14 0942 07/18/14 1534   07/16/14 1200  gentamicin (GARAMYCIN) 500 mg in dextrose 5 % 100 mL IVPB     500 mg 112.5 mL/hr over 60 Minutes Intravenous  Once 07/16/14 1135 07/16/14 1448   07/16/14 1115  ciprofloxacin (CIPRO) IVPB 400 mg     400 mg 200 mL/hr over 60 Minutes Intravenous Every 12 hours 07/16/14 1108     07/16/14 1015  ciprofloxacin (CIPRO) IVPB 400 mg     400 mg 200 mL/hr over 60 Minutes Intravenous  Once 07/16/14 1011 07/16/14 1147      Assessment/Plan:  1 Left renal stone with hydronephrosis - s/p stenting. Will need ureteroscopy in elective setting after over infectious parameters.   2 - Fever, Working Diagnosis of Urosepsis - now s/p renal decompression and on empiric ABX. CXR and KUB yesterday favroable. Fever curve with peaks trending down suggests clinical response to current treatment. Have now transitioned to lower dose Norva Karvonen given some renal insufficiency.   3 - Renal Insufficiency - likely medical renal from relative hypoperfusion and gent use. Appreciate pharmacy help in gent dosing to minimize nephrotoxic effects. Continue daily BMP and IV hydration.   Remain in house.   [facility] Shaurya Small 07/19/2014

## 2014-07-19 NOTE — Progress Notes (Signed)
ANTIBIOTIC CONSULT NOTE  Pharmacy Consult for Aztreonam Indication: urosepsis  Allergies  Allergen Reactions  . Penicillins Other (See Comments)    Heart rate changes  . Claritin [Loratadine]     Rash  . Clindamycin/Lincomycin Diarrhea    C-diff  . Morphine And Related     Nausea/vomiting  . Imipenem Rash    Primaxin antibiotic    Patient Measurements: Height: 5' 9"  (175.3 cm) Weight: 161 lb (73.029 kg) IBW/kg (Calculated) : 70.7  Vital Signs: Temp: 101.3 F (38.5 C) (06/25 0500) Temp Source: Oral (06/25 0500) BP: 126/68 mmHg (06/25 0500) Pulse Rate: 92 (06/25 0500) Intake/Output from previous day: 06/24 0701 - 06/25 0700 In: 3775 [I.V.:3375; IV Piggyback:400] Out: 700 [Urine:700] Intake/Output from this shift:    Labs:  Recent Labs  07/16/14 1028 07/16/14 1034 07/18/14 0501 07/19/14 0455  WBC 5.7  --  6.7 6.8  HGB 13.6 15.0 11.4* 10.6*  PLT 107*  --  54* 49*  CREATININE  --  1.60* 1.93* 2.05*   Estimated Creatinine Clearance: 40.2 mL/min (by C-G formula based on Cr of 2.05).  Recent Labs  07/18/14 1220 07/19/14 0455  GENTRANDOM 10.2 2.3     Microbiology: Recent Results (from the past 720 hour(s))  Urine culture     Status: None   Collection Time: 07/16/14 10:15 AM  Result Value Ref Range Status   Specimen Description URINE, CLEAN CATCH  Final   Special Requests NONE  Final   Culture   Final    3,000 COLONIES/mL INSIGNIFICANT GROWTH Performed at [facility]    Report Status 07/17/2014 FINAL  Final  Culture, blood (routine x 2)     Status: None (Preliminary result)   Collection Time: 07/16/14 12:30 PM  Result Value Ref Range Status   Specimen Description BLOOD RIGHT HAND  Final   Special Requests BOTTLES DRAWN AEROBIC AND ANAEROBIC 10CC  Final   Culture   Final    NO GROWTH 2 DAYS Performed at [facility]    Report Status PENDING  Incomplete  Culture, blood (routine x 2)     Status: None (Preliminary result)   Collection  Time: 07/16/14 12:40 PM  Result Value Ref Range Status   Specimen Description BLOOD RIGHT ARM  Final   Special Requests BOTTLES DRAWN AEROBIC ONLY 10CC  Final   Culture   Final    NO GROWTH 2 DAYS Performed at [facility]    Report Status PENDING  Incomplete  Surgical pcr screen     Status: None   Collection Time: 07/16/14  6:16 PM  Result Value Ref Range Status   MRSA, PCR NEGATIVE NEGATIVE Final   Staphylococcus aureus NEGATIVE NEGATIVE Final    Comment:        The Xpert SA Assay (FDA approved for NASAL specimens in patients over 68 years of age), is one component of a comprehensive surveillance program.  Test performance has been validated by [facility] for patients greater than or equal to 60 year old. It is not intended to diagnose infection nor to guide or monitor treatment.   Culture, blood (routine x 2)     Status: None (Preliminary result)   Collection Time: 07/18/14  9:53 AM  Result Value Ref Range Status   Specimen Description BLOOD RIGHT ANTECUBITAL  Final   Special Requests BOTTLES DRAWN AEROBIC AND ANAEROBIC 10CC  Final   Culture PENDING  Incomplete   Report Status PENDING  Incomplete  Culture, blood (routine x 2)  Status: None (Preliminary result)   Collection Time: 07/18/14 10:01 AM  Result Value Ref Range Status   Specimen Description BLOOD RIGHT ARM  Final   Special Requests BOTTLES DRAWN AEROBIC AND ANAEROBIC 10CC  Final   Culture PENDING  Incomplete   Report Status PENDING  Incomplete    Anti-infectives    Start     Dose/Rate Route Frequency Ordered Stop   07/19/14 1000  aztreonam (AZACTAM) 1 g in dextrose 5 % 50 mL IVPB     1 g 100 mL/hr over 30 Minutes Intravenous Every 8 hours 07/19/14 0844     07/18/14 0200  gentamicin (GARAMYCIN) 500 mg in dextrose 5 % 100 mL IVPB  Status:  Discontinued     500 mg 112.5 mL/hr over 60 Minutes Intravenous Every 36 hours 07/17/14 0942 07/18/14 1534   07/16/14 1200  gentamicin (GARAMYCIN) 500 mg in  dextrose 5 % 100 mL IVPB     500 mg 112.5 mL/hr over 60 Minutes Intravenous  Once 07/16/14 1135 07/16/14 1448   07/16/14 1115  ciprofloxacin (CIPRO) IVPB 400 mg     400 mg 200 mL/hr over 60 Minutes Intravenous Every 12 hours 07/16/14 1108     07/16/14 1015  ciprofloxacin (CIPRO) IVPB 400 mg     400 mg 200 mL/hr over 60 Minutes Intravenous  Once 07/16/14 1011 07/16/14 1147      Assessment: 54 yo male with left renal stone and hydronephrosis. Presents to ED with fever, malaise and concern for sepsis. MD has started IV cipro and Pharmacy is consulted to dose gentamicin. Patient is now s/p cystoscopy and insertion of L ureteral stent.  SCr rising and likely caused by nephrotoxic gentamicin.  Urine cultures (both from urology office PTA and on admission) are negative, WBC is WNL, but patient continues to have fever (Tmax24h 101.3).  After discussion with MD, will stop gentamicin and start aztreonam.    6/22 >> Cipro >> 6/22 >> Gent >> 6/25 6/25 >> Aztreonam >>  6/22 blood: NTGD 6/22 urine: insignificant growth (final) 6/24 blood: IP 6/24 urine: IP  SCr 1.6 > 1.93 > 2.05, CrCl~41 ml/min  Goal of Therapy:  Eradication of infection Appropriate antibiotic dosing for indication and renal function  Plan:  Day 4 antibiotics - Aztreonam 1g IV q8h.  - Continue Cipro 400 mg IV q12h.   - F/u SCr, culture results, clinical course.  Hershal Coria, PharmD, BCPS Pager: [phone] 07/19/2014 8:54 AM

## 2014-07-20 LAB — BASIC METABOLIC PANEL
Anion gap: 7 (ref 5–15)
BUN: 20 mg/dL (ref 6–20)
CALCIUM: 7.7 mg/dL — AB (ref 8.9–10.3)
CHLORIDE: 107 mmol/L (ref 101–111)
CO2: 21 mmol/L — ABNORMAL LOW (ref 22–32)
Creatinine, Ser: 1.89 mg/dL — ABNORMAL HIGH (ref 0.61–1.24)
GFR calc Af Amer: 44 mL/min — ABNORMAL LOW (ref >60)
GFR calc non Af Amer: 38 mL/min — ABNORMAL LOW (ref >60)
Glucose, Bld: 105 mg/dL — ABNORMAL HIGH (ref 65–99)
Potassium: 4 mmol/L (ref 3.5–5.1)
Sodium: 135 mmol/L (ref 135–145)

## 2014-07-20 MED ORDER — CIPROFLOXACIN HCL 500 MG PO TABS: 500.0000 mg | ORAL_TABLET | Freq: Two times a day (BID) | ORAL | Status: DC

## 2014-07-20 NOTE — Discharge Summary (Signed)
Physician Discharge Summary  Patient ID: Connor Small MRN: [id number] DOB/AGE: 09-13-1960 54 y.o.  Admit date: 07/16/2014 Discharge date: 07/20/2014  Admission Diagnoses: Left Renal Stone With Hydronephrosis, Urosepsis  Discharge Diagnoses:  Active Problems:   Sepsis   Discharged Condition: good  Hospital Course:    1 Left renal stone with hydronephrosis - s/p cysto left stent 07/16/14 for 1.4cm renal stone with upper pole focal hydro by ER CT 06/2014 on eval colickly flank pain in setting of clinical urosepsis.   2 - Fever, Working Diagnosis of Urosepsis - fever 102-103 intermintantly initially on admission presuambly from urosepsis. Repeat UCX / BCX 6/22 this admission pending and no growth to date. UCX 6/20 from Urol office negative. Denis cough, headache, GI/abdominal pain. Initially on empiric Cipro + Gent (pharmcy dosing), transitioned to cipro + Aztreonam 6/25 to decrease renal strain. CXR and KUB 6/24 confirm stent in good position and no worrisome infiltrates.   3 - Renal Insufficiency - Cr 1.9 6/25 up from 1.6 on admission. He has had some intermitant hypotension (asymptomatic) and exposure to nephrotoxic meds (gent). Minimla left hydro by CT and now s/p stent. No hyperkalemia. Changed gent to aztreonam 6/25 and GFR trending better.   By 6/26, the day of discharge, Safwaan is ambulatory, pain controlled on PO meds, and without high grade fever for >24 hrs and felt to be adequate for discharge.   Consults: None  Significant Diagnostic Studies: labs: UCX, BCX no growth to date.   Treatments: IV hydration, antibiotics: Cipro, gentamycin and aztreonam and surgery:  cysto left stent 07/16/14  Discharge Exam: Blood pressure 94/51, pulse 70, temperature 98.3 F (36.8 C), temperature source Oral, resp. rate 18, height 5\' 9"  (1.753 m), weight 73.029 kg (161 lb), SpO2 98 %. General appearance: alert, cooperative and appears stated age Eyes: negative Nose: Nares normal. Septum  midline. Mucosa normal. No drainage or sinus tenderness. Throat: lips, mucosa, and tongue normal; teeth and gums normal Neck: supple, symmetrical, trachea midline Back: symmetric, no curvature. ROM normal. No CVA tenderness. Resp: non-labored on room air.  Cardio: Nl rate GI: soft, non-tender; bowel sounds normal; no masses,  no organomegaly Extremities: extremities normal, atraumatic, no cyanosis or edema Pulses: 2+ and symmetric Skin: Skin color, texture, turgor normal. No rashes or lesions Lymph nodes: Cervical, supraclavicular, and axillary nodes normal. Neurologic: Grossly normal  Old abd scars w/o hernias.   Disposition: 01-Home or Self Care     Medication List    TAKE these medications        aspirin 81 MG chewable tablet  Chew 81 mg by mouth daily.     cholestyramine 4 G packet  Commonly known as:  QUESTRAN  Take 1 packet (4 g total) by mouth 2 (two) times daily.     ciprofloxacin 500 MG tablet  Commonly known as:  CIPRO  Take 1 tablet (500 mg total) by mouth 2 (two) times daily. X 7 days now. Also begin 2 days before next Urology surgery     clobetasol cream 0.05 %  Commonly known as:  TEMOVATE  Apply 1 application topically 2 (two) times daily. Apply to area with skin rash twice daily until rash resolved     diphenoxylate-atropine 2.5-0.025 MG per tablet  Commonly known as:  LOMOTIL  Take 1 tablet by mouth 4 (four) times daily.     mesalamine 500 MG CR capsule  Commonly known as:  PENTASA  Take 2 capsules (1,000 mg total) by mouth 2 (two) times daily.  Patient takes 2 by mouth twice daily     mesalamine 1000 MG suppository  Commonly known as:  CANASA  Place 1 suppository (1,000 mg total) rectally at bedtime.     multivitamin Liqd  Take 5 mLs by mouth daily.     oxyCODONE-acetaminophen 5-325 MG per tablet  Commonly known as:  PERCOCET/ROXICET  Take 1 tablet by mouth every 4 (four) hours as needed for moderate pain or severe pain.     saccharomyces  boulardii 250 MG capsule  Commonly known as:  FLORASTOR  Take 250 mg by mouth daily.           Follow-up Information    Follow up with Sebastian Ache, MD.   Specialty:  Urology   Why:  as previously scheduled for kidney stone surgery.    Contact information:   [address] [place] [address] [phone]       Signed: Sebastian Ache 07/20/2014, 2:34 PM

## 2014-07-20 NOTE — Progress Notes (Signed)
4 Days Post-Op  Subjective:  1 Left renal stone with hydronephrosis - s/p cysto left stent 07/16/14 for 1.4cm renal stone with upper pole focal hydro by ER CT 04/4918 on eval colickly flank pain in setting of clinical urosepsis.   2 - Fever, Working Diagnosis of Urosepsis - fever 102-103 intermintantly since admission presuambly from urosepsis.  Repeat UCX / BCX 6/22 this admission pending and now growth to date.  UCX 6/20 from Urol office negative. Denis cough, headache, GI/abdominal pain. Initially on empiric Cipro + Gent (pharmcy dosing), transitioned to cipro + Aztreonam 6/25 to decrease renal strain. CXR and KUB 6/24 confirm stent in good position and no worrisome infiltrates.   3 - Renal Insufficiency - Cr 1.9 6/25 up from 1.6 on admission. He has had some intermitant hypotension (asymptomatic) and exposure to nephrotoxic meds (gent). Minimla left hydro by CT and now s/p stent. No hyperkalemia. Changed gent to aztreonam 6/25 and GFR trending better.   Today Ayden feel much improved, significantly less malaise. Fever curve clearly trending down with peaks lower than priors. Last fever afternoon yesterday. He is anxious to go home.   Objective: Vital signs in last 24 hours: Temp:  [97.8 F (36.6 C)-100.7 F (38.2 C)] 98.3 F (36.8 C) (06/26 0530) Pulse Rate:  [66-70] 70 (06/26 0530) Resp:  [18] 18 (06/26 0530) BP: (94-105)/(51-54) 94/51 mmHg (06/26 0530) SpO2:  [98 %-100 %] 98 % (06/26 0530) Last BM Date: 07/19/14  Intake/Output from previous day: 06/25 0701 - 06/26 0700 In: 1500 [I.V.:1200; IV Piggyback:300] Out: 700 [Urine:700] Intake/Output this shift:    General appearance: alert, cooperative, appears stated age and wife at bedside Head: Normocephalic, without obvious abnormality, atraumatic Nose: Nares normal. Septum midline. Mucosa normal. No drainage or sinus tenderness. Throat: lips, mucosa, and tongue normal; teeth and gums normal Neck: supple, symmetrical, trachea  midline Back: symmetric, no curvature. ROM normal. No CVA tenderness. Resp: non-labored on room air Cardio: Nl rate GI: soft, non-tender; bowel sounds normal; no masses,  no organomegaly Extremities: extremities normal, atraumatic, no cyanosis or edema Skin: Skin color, texture, turgor normal. No rashes or lesions Lymph nodes: Cervical, supraclavicular, and axillary nodes normal. Neurologic: Grossly normal  Lab Results:   Recent Labs  07/18/14 0501 07/19/14 0455  WBC 6.7 6.8  HGB 11.4* 10.6*  HCT 34.9* 31.6*  PLT 54* 49*   BMET  Recent Labs  07/19/14 0455 07/20/14 0514  NA 132* 135  K 4.7 4.0  CL 104 107  CO2 22 21*  GLUCOSE 103* 105*  BUN 21* 20  CREATININE 2.05* 1.89*  CALCIUM 7.4* 7.7*   PT/INR No results for input(s): LABPROT, INR in the last 72 hours. ABG No results for input(s): PHART, HCO3 in the last 72 hours.  Invalid input(s): PCO2, PO2  Studies/Results: Dg Chest 2 View  07/18/2014   CLINICAL DATA:  Fever.  EXAM: CHEST  2 VIEW  COMPARISON:  10/16/2012  FINDINGS: Previously seen left pleural effusion has resolved. Residual pleural -parenchymal scarring is seen in the left mid and lower lung.  Increased streaky opacity is seen in right lower lung compared with previous study, and this may be due to atelectasis, scarring, or bronchopneumonia. Heart size remains within normal limits. No evidence of congestive heart failure.  IMPRESSION: Increased streaky opacity in right lower lung, which may be due to atelectasis, scarring, or bronchopneumonia. Recommend clinical correlation, and consider chest radiographic followup or chest CT for further evaluation.  Interval resolution of left pleural effusion, with  residual left-sided pleural- parenchymal scarring   Electronically Signed   By: Earle Gell M.D.   On: 07/18/2014 08:39   Dg Abd 1 View  07/18/2014   CLINICAL DATA:  Nausea and vomiting.  Constipation.  EXAM: ABDOMEN - 1 VIEW  COMPARISON:  CT 07/04/2014  FINDINGS:  Left-sided ureteral stent appears in proper position. Cluster of calcifications along the proximal aspect of the stent measuring 13 mm at the level of the renal pelvis.  IMPRESSION: 1. Double-J ureteral stent on the left. 2. Proximal right ureterolithiasis.   Electronically Signed   By: Suzy Bouchard M.D.   On: 07/18/2014 08:36    Anti-infectives: Anti-infectives    Start     Dose/Rate Route Frequency Ordered Stop   07/19/14 1000  aztreonam (AZACTAM) 1 g in dextrose 5 % 50 mL IVPB     1 g 100 mL/hr over 30 Minutes Intravenous Every 8 hours 07/19/14 0844     07/18/14 0200  gentamicin (GARAMYCIN) 500 mg in dextrose 5 % 100 mL IVPB  Status:  Discontinued     500 mg 112.5 mL/hr over 60 Minutes Intravenous Every 36 hours 07/17/14 0942 07/18/14 1534   07/16/14 1200  gentamicin (GARAMYCIN) 500 mg in dextrose 5 % 100 mL IVPB     500 mg 112.5 mL/hr over 60 Minutes Intravenous  Once 07/16/14 1135 07/16/14 1448   07/16/14 1115  ciprofloxacin (CIPRO) IVPB 400 mg     400 mg 200 mL/hr over 60 Minutes Intravenous Every 12 hours 07/16/14 1108     07/16/14 1015  ciprofloxacin (CIPRO) IVPB 400 mg     400 mg 200 mL/hr over 60 Minutes Intravenous  Once 07/16/14 1011 07/16/14 1147      Assessment/Plan:  1 Left renal stone with hydronephrosis - s/p stenting. Will need ureteroscopy in elective setting after over infectious parameters.   2 - Fever, Working Diagnosis of Urosepsis - now s/p renal decompression and on empiric ABX. CXR and KUB favroable. Fever curve with peaks trending down suggests clinical response to current treatment. Have now transitioned aztreonam + cipro.   3 - Renal Insufficiency - likely medical renal from relative hypoperfusion and gent use. Now improving and GFR overall acceptable.  Possible DC this PM v. Tomorrow as long as remains afebrile.   [facility], Janila Arrazola 07/20/2014

## 2014-07-20 NOTE — Discharge Instructions (Signed)
1 - You may have urinary urgency (bladder spasms) and bloody urine on / off with stent in place. This is normal. ° °2 - Call MD or go to ER for fever >102, severe pain / nausea / vomiting not relieved by medications, or acute change in medical status ° °

## 2014-07-21 LAB — CULTURE, BLOOD (ROUTINE X 2)
CULTURE: NO GROWTH
Culture: NO GROWTH

## 2014-07-23 ENCOUNTER — Encounter (HOSPITAL_BASED_OUTPATIENT_CLINIC_OR_DEPARTMENT_OTHER): Payer: Self-pay | Admitting: *Deleted

## 2014-07-23 LAB — CULTURE, BLOOD (ROUTINE X 2)
CULTURE: NO GROWTH
Culture: NO GROWTH

## 2014-07-23 NOTE — Progress Notes (Signed)
SPOKE W/ WIFE.  NPO AFTER MN. ARRIVE AT 0745.  CURRENT LAB RESULTS IN CHART AND EPIC.  WILL TAKE AM MEDS AND IF NEEDED OXYCODONE DOS W/ SIPS OF WATER.

## 2014-07-25 ENCOUNTER — Encounter (HOSPITAL_BASED_OUTPATIENT_CLINIC_OR_DEPARTMENT_OTHER): Payer: Self-pay

## 2014-07-25 ENCOUNTER — Ambulatory Visit (HOSPITAL_BASED_OUTPATIENT_CLINIC_OR_DEPARTMENT_OTHER): Payer: BLUE CROSS/BLUE SHIELD | Admitting: Anesthesiology

## 2014-07-25 ENCOUNTER — Ambulatory Visit (HOSPITAL_BASED_OUTPATIENT_CLINIC_OR_DEPARTMENT_OTHER)
Admission: RE | Admit: 2014-07-25 | Discharge: 2014-07-25 | Disposition: A | Discharge status: Home / Self Care | Payer: BLUE CROSS/BLUE SHIELD | Source: Ambulatory Visit | Attending: Urology | Admitting: Urology

## 2014-07-25 ENCOUNTER — Encounter (HOSPITAL_BASED_OUTPATIENT_CLINIC_OR_DEPARTMENT_OTHER)
Admission: RE | Disposition: A | Discharge status: Home / Self Care | Payer: Self-pay | Source: Ambulatory Visit | Attending: Urology

## 2014-07-25 DIAGNOSIS — I4891 Unspecified atrial fibrillation: Secondary | ICD-10-CM | POA: Diagnosis not present

## 2014-07-25 DIAGNOSIS — N132 Hydronephrosis with renal and ureteral calculous obstruction: Secondary | ICD-10-CM | POA: Diagnosis not present

## 2014-07-25 DIAGNOSIS — N200 Calculus of kidney: Secondary | ICD-10-CM | POA: Diagnosis present

## 2014-07-25 DIAGNOSIS — Q638 Other specified congenital malformations of kidney: Secondary | ICD-10-CM | POA: Diagnosis not present

## 2014-07-25 DIAGNOSIS — Z8673 Personal history of transient ischemic attack (TIA), and cerebral infarction without residual deficits: Secondary | ICD-10-CM | POA: Diagnosis not present

## 2014-07-25 DIAGNOSIS — Z87891 Personal history of nicotine dependence: Secondary | ICD-10-CM | POA: Diagnosis not present

## 2014-07-25 DIAGNOSIS — Z8711 Personal history of peptic ulcer disease: Secondary | ICD-10-CM | POA: Diagnosis not present

## 2014-07-25 DIAGNOSIS — Z86718 Personal history of other venous thrombosis and embolism: Secondary | ICD-10-CM | POA: Insufficient documentation

## 2014-07-25 DIAGNOSIS — Z87440 Personal history of urinary (tract) infections: Secondary | ICD-10-CM | POA: Diagnosis not present

## 2014-07-25 HISTORY — DX: Calculus of kidney: N20.0

## 2014-07-25 HISTORY — DX: Urgency of urination: R39.15

## 2014-07-25 HISTORY — DX: Personal history of other diseases of the respiratory system: Z87.09

## 2014-07-25 HISTORY — DX: Personal history of urinary calculi: Z87.442

## 2014-07-25 HISTORY — PX: HOLMIUM LASER APPLICATION: SHX5852

## 2014-07-25 HISTORY — PX: CYSTOSCOPY WITH RETROGRADE PYELOGRAM, URETEROSCOPY AND STENT PLACEMENT: SHX5789

## 2014-07-25 HISTORY — DX: Personal history of other infectious and parasitic diseases: Z86.19

## 2014-07-25 HISTORY — DX: Personal history of other diseases of the circulatory system: Z86.79

## 2014-07-25 HISTORY — DX: Personal history of other diseases of urinary system: Z87.448

## 2014-07-25 HISTORY — DX: Personal history of other venous thrombosis and embolism: Z86.718

## 2014-07-25 SURGERY — CYSTOURETEROSCOPY, WITH RETROGRADE PYELOGRAM AND STENT INSERTION
Anesthesia: General | Site: Ureter | Laterality: Left

## 2014-07-25 MED ORDER — GENTAMICIN SULFATE 40 MG/ML IJ SOLN
5.0000 mg/kg | INTRAVENOUS | Status: AC
  Administered 2014-07-25: 360 mg via INTRAVENOUS
  Filled 2014-07-25: qty 9

## 2014-07-25 MED ORDER — PHENYLEPHRINE HCL 10 MG/ML IJ SOLN
10.0000 mg | INTRAMUSCULAR | Status: DC | PRN
  Administered 2014-07-25: 40 ug/min via INTRAVENOUS

## 2014-07-25 MED ORDER — CIPROFLOXACIN HCL 500 MG PO TABS: 500.0000 mg | ORAL_TABLET | Freq: Two times a day (BID) | ORAL | Status: DC

## 2014-07-25 MED ORDER — GLYCOPYRROLATE 0.2 MG/ML IJ SOLN
INTRAMUSCULAR | Status: DC | PRN
  Administered 2014-07-25: 0.2 mg via INTRAVENOUS

## 2014-07-25 MED ORDER — KETOROLAC TROMETHAMINE 30 MG/ML IJ SOLN
INTRAMUSCULAR | Status: DC | PRN
  Administered 2014-07-25: 30 mg via INTRAVENOUS

## 2014-07-25 MED ORDER — DEXAMETHASONE SODIUM PHOSPHATE 10 MG/ML IJ SOLN
INTRAMUSCULAR | Status: DC | PRN
  Administered 2014-07-25: 10 mg via INTRAVENOUS

## 2014-07-25 MED ORDER — PROMETHAZINE HCL 25 MG/ML IJ SOLN
6.2500 mg | INTRAMUSCULAR | Status: DC | PRN
  Filled 2014-07-25: qty 1

## 2014-07-25 MED ORDER — LACTATED RINGERS IV SOLN
INTRAVENOUS | Status: DC
  Administered 2014-07-25 (×3): via INTRAVENOUS
  Filled 2014-07-25: qty 1000

## 2014-07-25 MED ORDER — FENTANYL CITRATE (PF) 100 MCG/2ML IJ SOLN
25.0000 ug | INTRAMUSCULAR | Status: DC | PRN
  Filled 2014-07-25: qty 1

## 2014-07-25 MED ORDER — IOHEXOL 350 MG/ML SOLN
INTRAVENOUS | Status: DC | PRN
  Administered 2014-07-25: 22 mL

## 2014-07-25 MED ORDER — OXYCODONE-ACETAMINOPHEN 5-325 MG PO TABS: 1.0000 | ORAL_TABLET | ORAL | Status: DC | PRN

## 2014-07-25 MED ORDER — FENTANYL CITRATE (PF) 100 MCG/2ML IJ SOLN
INTRAMUSCULAR | Status: AC
  Filled 2014-07-25: qty 6

## 2014-07-25 MED ORDER — PHENYLEPHRINE HCL 10 MG/ML IJ SOLN
INTRAMUSCULAR | Status: DC | PRN
  Administered 2014-07-25 (×3): 80 ug via INTRAVENOUS

## 2014-07-25 MED ORDER — GENTAMICIN IN SALINE 1.6-0.9 MG/ML-% IV SOLN
80.0000 mg | INTRAVENOUS | Status: DC
  Filled 2014-07-25: qty 50

## 2014-07-25 MED ORDER — SODIUM CHLORIDE 0.9 % IR SOLN
Status: DC | PRN
  Administered 2014-07-25: 500 mL
  Administered 2014-07-25: 1000 mL
  Administered 2014-07-25: 3000 mL

## 2014-07-25 MED ORDER — PROPOFOL 10 MG/ML IV BOLUS
INTRAVENOUS | Status: DC | PRN
  Administered 2014-07-25: 170 mg via INTRAVENOUS

## 2014-07-25 MED ORDER — ONDANSETRON HCL 4 MG/2ML IJ SOLN
INTRAMUSCULAR | Status: DC | PRN
  Administered 2014-07-25: 4 mg via INTRAVENOUS

## 2014-07-25 MED ORDER — ACETAMINOPHEN 10 MG/ML IV SOLN
INTRAVENOUS | Status: DC | PRN
  Administered 2014-07-25: 1000 mg via INTRAVENOUS

## 2014-07-25 MED ORDER — FENTANYL CITRATE (PF) 100 MCG/2ML IJ SOLN
INTRAMUSCULAR | Status: DC | PRN
  Administered 2014-07-25: 25 ug via INTRAVENOUS
  Administered 2014-07-25: 12.5 ug via INTRAVENOUS
  Administered 2014-07-25: 25 ug via INTRAVENOUS
  Administered 2014-07-25: 12.5 ug via INTRAVENOUS
  Administered 2014-07-25: 25 ug via INTRAVENOUS
  Administered 2014-07-25: 12.5 ug via INTRAVENOUS
  Administered 2014-07-25 (×3): 25 ug via INTRAVENOUS
  Administered 2014-07-25: 12.5 ug via INTRAVENOUS

## 2014-07-25 MED ORDER — PROMETHAZINE HCL 12.5 MG PO TABS
12.5000 mg | ORAL_TABLET | Freq: Four times a day (QID) | ORAL | Status: DC | PRN
Start: 2014-07-25 — End: 2014-09-08

## 2014-07-25 MED ORDER — LIDOCAINE HCL (CARDIAC) 20 MG/ML IV SOLN
INTRAVENOUS | Status: DC | PRN
  Administered 2014-07-25: 80 mg via INTRAVENOUS

## 2014-07-25 MED ORDER — MIDAZOLAM HCL 5 MG/5ML IJ SOLN
INTRAMUSCULAR | Status: DC | PRN
  Administered 2014-07-25: 1 mg via INTRAVENOUS
  Administered 2014-07-25 (×2): 0.5 mg via INTRAVENOUS

## 2014-07-25 MED ORDER — MIDAZOLAM HCL 2 MG/2ML IJ SOLN
INTRAMUSCULAR | Status: AC
Start: 2014-07-25 — End: 2014-07-25
  Filled 2014-07-25: qty 2

## 2014-07-25 SURGICAL SUPPLY — 39 items
BAG DRAIN URO-CYSTO SKYTR STRL (DRAIN) ×4 IMPLANT
BASKET LASER NITINOL 1.9FR (BASKET) ×4 IMPLANT
BASKET STNLS GEMINI 4WIRE 3FR (BASKET) IMPLANT
BASKET ZERO TIP NITINOL 2.4FR (BASKET) IMPLANT
CANISTER SUCT LVC 12 LTR MEDI- (MISCELLANEOUS) IMPLANT
CATH INTERMIT  6FR 70CM (CATHETERS) ×4 IMPLANT
CATH URET 5FR 28IN CONE TIP (BALLOONS)
CATH URET 5FR 28IN OPEN ENDED (CATHETERS) ×4 IMPLANT
CATH URET 5FR 70CM CONE TIP (BALLOONS) IMPLANT
CLOTH BEACON ORANGE TIMEOUT ST (SAFETY) ×4 IMPLANT
ELECT REM PT RETURN 9FT ADLT (ELECTROSURGICAL)
ELECTRODE REM PT RTRN 9FT ADLT (ELECTROSURGICAL) IMPLANT
FIBER LASER FLEXIVA 365 (UROLOGICAL SUPPLIES) IMPLANT
FIBER LASER TRAC TIP (UROLOGICAL SUPPLIES) ×4 IMPLANT
GLOVE BIO SURGEON STRL SZ7 (GLOVE) ×4 IMPLANT
GLOVE BIO SURGEON STRL SZ7.5 (GLOVE) ×4 IMPLANT
GLOVE ECLIPSE 7.5 STRL STRAW (GLOVE) ×4 IMPLANT
GLOVE SURG SS PI 7.5 STRL IVOR (GLOVE) ×4 IMPLANT
GOWN STRL REUS W/ TWL LRG LVL3 (GOWN DISPOSABLE) IMPLANT
GOWN STRL REUS W/ TWL XL LVL3 (GOWN DISPOSABLE) IMPLANT
GOWN STRL REUS W/TWL LRG LVL3 (GOWN DISPOSABLE) ×8 IMPLANT
GOWN STRL REUS W/TWL XL LVL3 (GOWN DISPOSABLE) ×4 IMPLANT
GUIDEWIRE 0.038 PTFE COATED (WIRE) IMPLANT
GUIDEWIRE ANG ZIPWIRE 038X150 (WIRE) ×4 IMPLANT
GUIDEWIRE STR DUAL SENSOR (WIRE) ×4 IMPLANT
IV NS 1000ML (IV SOLUTION) ×2
IV NS 1000ML BAXH (IV SOLUTION) ×2 IMPLANT
IV NS IRRIG 3000ML ARTHROMATIC (IV SOLUTION) ×8 IMPLANT
KIT BALLIN UROMAX 15FX10 (LABEL) IMPLANT
KIT BALLN UROMAX 15FX4 (MISCELLANEOUS) IMPLANT
KIT BALLN UROMAX 26 75X4 (MISCELLANEOUS)
MANIFOLD NEPTUNE II (INSTRUMENTS) ×4 IMPLANT
PACK CYSTO (CUSTOM PROCEDURE TRAY) ×4 IMPLANT
SET HIGH PRES BAL DIL (LABEL)
SHEATH ACCESS URETERAL 38CM (SHEATH) ×4 IMPLANT
STENT POLARIS 5FRX26 (STENTS) ×8 IMPLANT
SYRINGE 10CC LL (SYRINGE) ×8 IMPLANT
SYRINGE IRR TOOMEY STRL 70CC (SYRINGE) IMPLANT
TUBE FEEDING 8FR 16IN STR KANG (MISCELLANEOUS) IMPLANT

## 2014-07-25 NOTE — Anesthesia Procedure Notes (Signed)
Procedure Name: LMA Insertion Date/Time: 07/25/2014 9:35 AM Performed by: Justice Rocher Pre-anesthesia Checklist: Patient identified, Emergency Drugs available, Suction available and Patient being monitored Patient Re-evaluated:Patient Re-evaluated prior to inductionOxygen Delivery Method: Circle System Utilized Preoxygenation: Pre-oxygenation with 100% oxygen Intubation Type: IV induction Ventilation: Mask ventilation without difficulty LMA: LMA inserted LMA Size: 4.0 Number of attempts: 1 Airway Equipment and Method: Bite block Placement Confirmation: positive ETCO2 Tube secured with: Tape Dental Injury: Teeth and Oropharynx as per pre-operative assessment

## 2014-07-25 NOTE — Brief Op Note (Signed)
07/25/2014  11:29 AM  PATIENT:  Connor Small  54 y.o. male  PRE-OPERATIVE DIAGNOSIS:  LEFT GREATER THAN RIGHT KIDNEY STONES  POST-OPERATIVE DIAGNOSIS:  LEFT GREATER THAN RIGHT KIDNEY STONES  PROCEDURE:  Procedure(s): CYSTOSCOPY WITH RETROGRADE PYELOGRAM, URETEROSCOPY AND BILATERAL STENT PLACEMENT (Bilateral) HOLMIUM LASER APPLICATION, LEFT ONLY  (Left)  SURGEON:  Surgeon(s) and Role:    * Alexis Frock, MD - Primary  PHYSICIAN ASSISTANT:   ASSISTANTS: none   ANESTHESIA:   general  EBL:  Total I/O In: 1000 [I.V.:1000] Out: -   BLOOD ADMINISTERED:none  DRAINS: none   LOCAL MEDICATIONS USED:  NONE  SPECIMEN:  Source of Specimen:  Left renal stone fragments  DISPOSITION OF SPECIMEN:  [facility] for compositional analysis  COUNTS:  YES  TOURNIQUET:  * No tourniquets in log *  DICTATION: .Other Dictation: Dictation Number  [id number]  PLAN OF CARE: Discharge to home after PACU  PATIENT DISPOSITION:  PACU - hemodynamically stable.   Delay start of Pharmacological VTE agent (>24hrs) due to surgical blood loss or risk of bleeding: not applicable

## 2014-07-25 NOTE — H&P (Signed)
Connor Small is an 54 y.o. male.    Chief Complaint: Pre-Op Left Ureteroscopic Stone Manipulation  HPI:   1 - Large Left Renal Stone - 1.4cm left upper pole infindibulat stone by CT 06/2014 on eval left flank pain and malaise. Left JJ stent placed to decompress obstructed uper pole 07/16/14.  2 - Recent Febrile UTI - pt admitted for IV ABX 06/2014 following JJ stent placement for left partially obsructing renal stone. UCX and BCX negative x several and deffervesced on empiric ABX.  Today " Connor Small " is seen to proceed with elective left ureterosocpic stone manipulation for large left upper pole infundibular stone. No interval fevers and most recent UCX negative.   Past Medical History  Diagnosis Date  . Chronic diarrhea   . Rectal bleed   . Hemorrhoids   . Ulcerative colitis followed by dr Laural Golden    Distal UC dx 2004  . 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 History of stroke March 2014 --  residual right weaker than prior to cva, unable to make fist    Bilateral cerebral and right cerebellar infarcts - felt to be embolic vs hypotensive (in setting of septic shock, transient a-fib, ARDS)  . Kidney stones   . History of DVT (deep vein thrombosis)     2014--  right upper extremity  . 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 History of acute renal failure     post-op  total colectomy 02-27-2012--  required dialysis--  resolved  . History of ARDS     02-27-2012  post op total colectomy -- septic shock-  required intubation/ventilator for 3-4 wks  . History of pleural effusion     acute- bilateral in setting septic shock post op Feb 2014  . History of kidney stones   . History of Clostridium difficile colitis     02-27-2012--  fulminant C.Diff colitis caused toxic megacolon--  s/p  total colectomy  . Renal calculi     bilateral--  left > right  . History of atrial fibrillation     02/  2014--  no issues since  . Urgency of urination     Past Surgical History  Procedure Laterality Date  . Colonoscopy  02/16/2011    Procedure: COLONOSCOPY;  Surgeon: Rogene Houston, MD;  [facility];  Service: Endoscopy;  Laterality: N/A;  100  . Flexible sigmoidoscopy  02/27/2012    Procedure: FLEXIBLE SIGMOIDOSCOPY;  Surgeon: Rogene Houston, MD;  [facility];  Service: Endoscopy;  Laterality: N/A;  with colonic decompression  . Laparotomy  02/27/2012    Procedure: EXPLORATORY LAPAROTOMY;  Surgeon: Donato Heinz, MD;  Location: [facility];  Service: General;  Laterality: N/A;  . Cecostomy  02/27/2012    Procedure: CECOSTOMY;  Surgeon: Donato Heinz, MD;  Location: [facility];  Service: General;  Laterality: N/A;  Cecostomy Tube Placement  . Colectomy  02/28/2012    Procedure: TOTAL COLECTOMY;  Surgeon: Donato Heinz, MD;  Location: [facility];  Service: General;  Laterality: N/A;  . Ileostomy  02/28/2012    Procedure: ILEOSTOMY;  Surgeon: Donato Heinz, MD;  Location: [facility];  Service: General;  Laterality: N/A;  . Cystoscopy w/ ureteral stent placement Bilateral 03/20/2012    Procedure: CYSTOSCOPY WITH RETROGRADE PYELOGRAM/;  Surgeon: Alexis Frock, MD;  Location: [place];  Service:  Urology;  Laterality: Bilateral. Pt reports no stents were placed.  . Laparotomy N/A 03/26/2012    Procedure: EXPLORATORY LAPAROTOMY;  Surgeon: Edward Jolly, MD;  Location: [place];  Service: General;  Laterality: N/A;  . Application of wound vac N/A 03/26/2012    Procedure: APPLICATION OF WOUND VAC;  Surgeon: Edward Jolly, MD;  Location: [facility];  Service: General;  Laterality: N/A;  . Liver biopsy N/A 03/26/2012    Procedure: LIVER BIOPSY;  Surgeon: Edward Jolly, MD;  Location: [place];  Service: General;  Laterality: N/A;  . Laparotomy N/A 03/29/2012    Procedure: EXPLORATORY LAPAROTOMY, PARTIAL WOUND CLOSURE;  Surgeon: Edward Jolly, MD;  Location: [place];  Service: General;   Laterality: N/A;  . Vacuum assisted closure change N/A 03/29/2012    Procedure: Open ABDOMINAL VACUUM  CHANGE;  Surgeon: Edward Jolly, MD;  Location: [place];  Service: General;  Laterality: N/A;  . Vacuum assisted closure change N/A 04/01/2012    Procedure: removal of abdominal vac dressing and abdominal closure;  Surgeon: Edward Jolly, MD;  Location: [place];  Service: General;  Laterality: N/A;  . Flexible sigmoidoscopy N/A 02/06/2013    Procedure: FLEXIBLE SIGMOIDOSCOPY;  Surgeon: Rogene Houston, MD;  [facility];  Service: Endoscopy;  Laterality: N/A;  100-moved to 1200 Ann notified pt  . Flexible sigmoidoscopy N/A 03/27/2013    Procedure: FLEXIBLE SIGMOIDOSCOPY;  Surgeon: Rogene Houston, MD;  [facility];  Service: Endoscopy;  Laterality: N/A;  225  . Ileostomy closure N/A 05/27/2013    Procedure: ILEOSTOMY TAKEDOWN WITH ILEOPROCTOSTOMY;  Surgeon: Edward Jolly, MD;  Location: [facility];  Service: General;  Laterality: N/A;  . Flexible sigmoidoscopy N/A 12/11/2013    Procedure: FLEXIBLE SIGMOIDOSCOPY;  Surgeon: Rogene Houston, MD;  [facility];  Service: Endoscopy;  Laterality: N/A;  200  . Cystoscopy with retrograde pyelogram, ureteroscopy and stent placement Left 07/16/2014    Procedure: CYSTOSCOPY WITH RETROGRADE PYELOGRAM,  AND LEFT STENT PLACEMENT;  Surgeon: Alexis Frock, MD;  Location: [facility];  Service: Urology;  Laterality: Left;  . Sigmoid colectomy /  low anterior resection/  end sigmoid colostomy  11-07-2010   [facility]    Sigmoid colonic perferation---  TAKE DOWN COLOSTOMY,  Jan 2013  . Transthoracic echocardiogram  03-26-2012  (in setting of septic shock)    report states  poorly visualized--  normal LVSF, ef 50-55% and no pericardial effusion    Family History  Problem Relation Age of Onset  . Cancer Sister   . Healthy Daughter   . Hypertension Mother   . Colon cancer Neg Hx    Social History:  reports that he quit smoking  about 30 years ago. His smoking use included Cigarettes. He has a 6 pack-year smoking history. He has never used smokeless tobacco. He reports that he does not drink alcohol or use illicit drugs.  Allergies:  Allergies  Allergen Reactions  . Penicillins Other (See Comments)    "Heart rate slowed down"  bradycardia  . Clindamycin/Lincomycin Diarrhea    C-diff  . Morphine And Related Nausea And Vomiting  . Claritin [Loratadine] Rash  . Primaxin [Imipenem] Rash    No prescriptions prior to admission    No results found for this or any previous visit (from the past 11 hour(s)). No results found.  Review of Systems  Constitutional: Negative.  Negative for fever, chills, weight loss and malaise/fatigue.  HENT: Negative.  Eyes: Negative.   Respiratory: Negative.   Cardiovascular: Negative.   Gastrointestinal: Negative.   Genitourinary: Negative.   Musculoskeletal: Negative.   Skin: Negative.   Neurological: Negative.   Endo/Heme/Allergies: Negative.   Psychiatric/Behavioral: Negative.     There were no vitals taken for this visit. Physical Exam  Constitutional: He appears well-developed.  HENT:  Head: Normocephalic.  Eyes: Pupils are equal, round, and reactive to light.  Neck: Normal range of motion.  Cardiovascular: Normal rate.   Respiratory: Effort normal.  GI: Soft.  Genitourinary:  No CVAT  Musculoskeletal: Normal range of motion.  Neurological: He is alert.  Skin: Skin is warm.  Psychiatric: He has a normal mood and affect. His behavior is normal. Judgment and thought content normal.     Assessment/Plan  1 - Large Left Renal Stone - proceed as planned for left ureteroscopic stone manipulation today. Large stone and staged approach may be required. Risks, benefits, alternatives reiterated.   2 - Recent Febrile UTI - pt now with clearance of all infectious parameters and I feel acceptable risk to proceed today as planned, he is certainly at higher relative risk  of infectious complicaitons though and he has good understanding of this.   Carolan Avedisian 07/25/2014, 3:57 AM

## 2014-07-25 NOTE — Anesthesia Postprocedure Evaluation (Signed)
  Anesthesia Post-op Note  Patient: Connor Small  Procedure(s) Performed: Procedure(s) (LRB): CYSTOSCOPY WITH RETROGRADE PYELOGRAM, URETEROSCOPY AND BILATERAL STENT PLACEMENT (Bilateral) HOLMIUM LASER APPLICATION, LEFT ONLY  (Left)  Patient Location: PACU  Anesthesia Type: general  Level of Consciousness: awake and alert   Airway and Oxygen Therapy: Patient Spontanous Breathing  Post-op Pain: mild  Post-op Assessment: Post-op Vital signs reviewed, Patient's Cardiovascular Status Stable, Respiratory Function Stable, Patent Airway and No signs of Nausea or vomiting  Last Vitals:  Filed Vitals:   07/25/14 1227  BP: 112/65  Pulse: 70  Temp:   Resp: 12    Post-op Vital Signs: stable   Complications: No apparent anesthesia complications

## 2014-07-25 NOTE — Transfer of Care (Signed)
Immediate Anesthesia Transfer of Care Note  Patient: Connor Small  Procedure(s) Performed: Procedure(s) (LRB): CYSTOSCOPY WITH RETROGRADE PYELOGRAM, URETEROSCOPY AND BILATERAL STENT PLACEMENT (Bilateral) HOLMIUM LASER APPLICATION, LEFT ONLY  (Left)  Patient Location: PACU  Anesthesia Type: General  Level of Consciousness: awake, sedated, patient cooperative and responds to stimulation  Airway & Oxygen Therapy: Patient Spontanous Breathing and Patient connected to face mask oxygen  Post-op Assessment: Report given to PACU RN, Post -op Vital signs reviewed and stable and Patient moving all extremities  Post vital signs: Reviewed and stable  Complications: No apparent anesthesia complications

## 2014-07-25 NOTE — Anesthesia Preprocedure Evaluation (Addendum)
Anesthesia Evaluation  Patient identified by MRN, date of birth, ID band Patient awake    Reviewed: Allergy & Precautions, NPO status , Patient's Chart, lab work & pertinent test results  Airway Mallampati: II  TM Distance: >3 FB Neck ROM: Full    Dental  (+)    Pulmonary pneumonia -, resolved, former smoker,  breath sounds clear to auscultation  Pulmonary exam normal       Cardiovascular Exercise Tolerance: Good negative cardio ROS Normal cardiovascular examRhythm:Regular Rate:Normal     Neuro/Psych Right hand weakness  Neuromuscular disease CVA, Residual Symptoms negative psych ROS   GI/Hepatic Neg liver ROS, PUD,   Endo/Other  negative endocrine ROS  Renal/GU Renal disease  negative genitourinary   Musculoskeletal negative musculoskeletal ROS (+)   Abdominal   Peds negative pediatric ROS (+)  Hematology  (+) anemia ,   Anesthesia Other Findings   Reproductive/Obstetrics negative OB ROS                            Anesthesia Physical Anesthesia Plan  ASA: III  Anesthesia Plan: General   Post-op Pain Management:    Induction: Intravenous  Airway Management Planned: LMA  Additional Equipment:   Intra-op Plan:   Post-operative Plan: Extubation in OR  Informed Consent: I have reviewed the patients History and Physical, chart, labs and discussed the procedure including the risks, benefits and alternatives for the proposed anesthesia with the patient or authorized representative who has indicated his/her understanding and acceptance.   Dental advisory given  Plan Discussed with: CRNA  Anesthesia Plan Comments:         Anesthesia Quick Evaluation

## 2014-07-25 NOTE — Discharge Instructions (Signed)
1 - You may have urinary urgency (bladder spasms) and bloody urine on / off with stent in place. This is normal.  2 - Call MD or go to ER for fever >102, severe pain / nausea / vomiting not relieved by medications, or acute change in medical status [facility] [phone] Post Ureteroscopy With or Without Stent Instructions  Definitions:  Ureter: The duct that transports urine from the kidney to the bladder. Stent:   A plastic hollow tube that is placed into the ureter, from the kidney to the                 bladder to prevent the ureter from swelling shut.  GENERAL INSTRUCTIONS:  Despite the fact that no skin incisions were used, the area around the ureter and bladder is raw and irritated. The stent is a foreign body which will further irritate the bladder wall. This irritation is manifested by increased frequency of urination, both day and night, and by an increase in the urge to urinate. In some, the urge to urinate is present almost always. Sometimes the urge is strong enough that you may not be able to stop yourself from urinating. The only real cure is to remove the stent and then give time for the bladder wall to heal which can't be done until the danger of the ureter swelling shut has passed, which varies.  You may see some blood in your urine while the stent is in place and a few days afterwards. Do not be alarmed, even if the urine was clear for a while. Get off your feet and drink lots of fluids until clearing occurs. If you start to pass clots or don't improve, call us.  DIET: You may return to your normal diet immediately. Because of the raw surface of your bladder, alcohol, spicy foods, acid type foods and drinks with caffeine may cause irritation or frequency and should be used in moderation. To keep your urine flowing freely and to avoid constipation, drink plenty of fluids during the day ( 8-10 glasses ). Tip: Avoid cranberry juice because it is very  acidic.  ACTIVITY: Your physical activity doesn't need to be restricted. However, if you are very active, you may see some blood in your urine. We suggest that you reduce your activity under these circumstances until the bleeding has stopped.  BOWELS: It is important to keep your bowels regular during the postoperative period. Straining with bowel movements can cause bleeding. A bowel movement every other day is reasonable. Use a mild laxative if needed, such as Milk of Magnesia 2-3 tablespoons, or 2 Dulcolax tablets. Call if you continue to have problems. If you have been taking narcotics for pain, before, during or after your surgery, you may be constipated. Take a laxative if necessary.   MEDICATION: You should resume your pre-surgery medications unless told not to. In addition you will often be given an antibiotic to prevent infection. These should be taken as prescribed until the bottles are finished unless you are having an unusual reaction to one of the drugs.  PROBLEMS YOU SHOULD REPORT TO [place]:  Fevers over 100.5 Fahrenheit.  Heavy bleeding, or clots ( See above notes about blood in urine ).  Inability to urinate.  Drug reactions ( hives, rash, nausea, vomiting, diarrhea ).  Severe burning or pain with urination that is not improving.  FOLLOW-UP: You will need a follow-up appointment to monitor your progress. Call for this appointment at the number listed above.  Usually the first appointment will be about three to fourteen days after your surgery.      Post Anesthesia Home Care Instructions  Activity: Get plenty of rest for the remainder of the day. A responsible adult should stay with you for 24 hours following the procedure.  For the next 24 hours, DO NOT: -Drive a car -Paediatric nurse -Drink alcoholic beverages -Take any medication unless instructed by your physician -Make any legal decisions or sign important papers.  Meals: Start with liquid foods such as  gelatin or soup. Progress to regular foods as tolerated. Avoid greasy, spicy, heavy foods. If nausea and/or vomiting occur, drink only clear liquids until the nausea and/or vomiting subsides. Call your physician if vomiting continues.  Special Instructions/Symptoms: Your throat may feel dry or sore from the anesthesia or the breathing tube placed in your throat during surgery. If this causes discomfort, gargle with warm salt water. The discomfort should disappear within 24 hours.  If you had a scopolamine patch placed behind your ear for the management of post- operative nausea and/or vomiting:  1. The medication in the patch is effective for 72 hours, after which it should be removed.  Wrap patch in a tissue and discard in the trash. Wash hands thoroughly with soap and water. 2. You may remove the patch earlier than 72 hours if you experience unpleasant side effects which may include dry mouth, dizziness or visual disturbances. 3. Avoid touching the patch. Wash your hands with soap and water after contact with the patch.

## 2014-07-28 NOTE — Op Note (Signed)
NAME:  Connor Small, Connor Small NO.:  [id number]  MEDICAL RECORD NO.:  [id number]  LOCATION:                                 FACILITY:  PHYSICIAN:  Sebastian Ache, MD     DATE OF BIRTH:  06-Sep-1960  DATE OF PROCEDURE:  07/25/2014                              OPERATIVE REPORT  PREOPERATIVE DIAGNOSES:  Large volume left renal stone, punctate right renal stone.  POSTOPERATIVE DIAGNOSES: 1. Large upper pole infundibular stone. 2. No right-sided nephrolithiasis.  PROCEDURES: 1. Cystoscopy with bilateral retrograde pyelogram and interpretation. 2. Right diagnostic ureteroscopy. 3. Left ureteroscopy with laser lithotripsy. 4. Insertion of bilateral ureteral stents, 5 x 26, Polaris, no     tethers.  ESTIMATED BLOOD LOSS:  Nil.  COMPLICATIONS:  None.  SPECIMEN:  Left renal stone fragments for compositional analysis.  FINDINGS: 1. Relatively bifid left kidney large volume fusiform stone within     upper pole infundibulum with mild hydronephrosis above this. 2. Complete resolution of all stone fragments larger than 1/3rd mm     applying holmium laser lithotripsy and basket extraction on the     left. 3. Unremarkable right retrograde pyelogram. 4. No evidence of intraluminal urolithiasis within the right ureter,     right kidney with inspection x3. 5. Excellent placement of bilateral ureteral stents, proximal and     renal pelvis, distal end of urinary bladder bilaterally.  INDICATIONS:  Connor Small is a very pleasant, but unfortunate 54 year old gentleman with long history of abdominal surgery as well as some recurrent nephrolithiasis.  He has had a recent episode of left upper pole infundibular large stone with urosepsis.  He was temporized with stenting, admission for intravenous antibiotics and subsequently cleared his infectious parameters.  He now presents for definitive stone management.  He was noted to have some punctate calcifications on the right and his goal  is overall stone free and the plan is bilateral ureteroscopic stone manipulation.  Informed consent was obtained and placed in the medical record.  PROCEDURE IN DETAIL:  The patient being Connor Small, was verified. Procedure being bilateral ureteroscopic stone manipulation was confirmed.  Procedure was carried out.  Time-out was performed. Intravenous antibiotics were administered.  General LMA anesthesia was introduced.  The patient was placed into a low lithotomy position and sterile field was created by prepping and draping the patient's penis, perineum, and proximal thighs using iodine x3.  Next, cystourethroscopy was performed using a 23-French rigid cystoscope with 30-degree offset lens.  Inspection of the anterior and posterior urethra was unremarkable.  Inspection of the bladder revealed distal end of the left ureteral stent in situ.  The right ureteral orifice was unremarkable. There were no papular lesions or calcifications.  Distal end of the left stent was grasped proximally at the level of the urethral meatus, through which, a 0.038 Zip wire was advanced at the level of upper pole and was exchanged for a 6-French open-ended catheter and left retrograde pyelogram was obtained.  Left retrograde pyelogram demonstrated a single left ureter with single- system left kidney.  There was large filling defect in left upper pole infundibulum as expected with mild hydronephrosis above this.  A  0.038 Zip wire was advanced across this into the area of the upper pole and set aside as a safety wire.  An 8-French feeding tube was placed in the urinary bladder for pressure release.  Next, semi-rigid ureteroscopy was performed in the entire length of left ureter alongside a separate Sensor working wire.  No mucosal abnormalities were found.  This was exchanged for a 38-cm 12/14 ureteral access sheath at the level of the proximal ureter using fluoroscopic guidance.  Next, flexible  digital ureteroscopy was performed using a dual-channel ureteroscope and panendoscopic inspection of the kidney revealed as expected the large fusiform stone in upper pole infundibulum.  There was significant bifid anatomy to the left kidney.  The stone appeared much too large for simple basketing.  As such, holmium laser energy was applied to the stone using settings of 0.2 joules and 20 hertz performing laser lithotripsy for approximately 1 hour, generating fragments 1-2 mm or less in diameter as the goal, this resulted in excellent fragmentation and clearance of the submandibular stone and allowed to easily pass to the upper pole.  An Escape-type basket was then used to sequentially grasped these stone fragments and removed in their entirety, set aside for compositional analysis.  Given the volume of the stone, this inherently degenerate large volume of very small fragments that were too small for simple basketing.  As such, a blood-mop technique was used, in which, 5 mL of autologous blood was delivered via the ureteroscope into the upper pole calyx, allowed to coagulate for 5 minutes and then removed sequentially with Escape basket, which resulted in removal approximately 75% of these small volume residual stone fragments. Following this maneuver, there was excellent hemostasis, complete resolution of all stone fragments larger than 1/3rd mm with panendoscopic inspection of the kidney.  There was excellent hemostasis. The access sheath was removed under continuous uteroscopic vision.  No mucosal abnormalities were found.  Attention was then directed to the right side.  The right ureteral orifice was cannulated with a 6-French end-hole catheter and right retrograde pyelogram was obtained.  Right retrograde pyelogram demonstrated a single right ureter with single-system right kidney.  No filling defects or narrowing noted.  The right kidney did not appear bifid as unlike the left.  A  separate 0.038 Zip wire was advanced at the level of the upper pole, set aside as a safety wire and semi-rigid ureteroscopy was performed in the entire length of right ureter alongside a separate Sensor working wire.  No mucosal abnormalities were found.  This was exchanged for a 12/14, 38-cm ureteral access sheath at the level of the proximal ureter. Panendoscopic examination of the kidney x3 revealed no intraluminal calcifications whatsoever and no papillary tip calcifications, so that the prior calcifications noted were likely parenchymal.  The access sheath was removed under continuous ureteroscopic vision.  No mucosal abnormalities were found.  Given the patient's history of urosepsis, it was clearly felt that bilateral ureteral stenting would be warranted. As such, 5 x 26 Polaris-type stents were placed over the remaining safety wires bilaterally using cystoscopic guidance.  Good proximal and distal deployment were noted, proximal end of the right stent in the upper pole, distal end of the urinary bladder, proximal end of the left stent midpole distal in the urinary bladder.  Bladder was emptied per cystoscope.  Procedure was then terminated.  The patient tolerated the procedure well.  There were no immediate periprocedural complications.  The patient was taken to the postanesthesia care unit in stable  condition.          ______________________________ Sebastian Ache, MD     TM/MEDQ  D:  07/25/2014  T:  07/26/2014  Job:  [id number]

## 2014-07-29 ENCOUNTER — Encounter (HOSPITAL_BASED_OUTPATIENT_CLINIC_OR_DEPARTMENT_OTHER): Payer: Self-pay | Admitting: Urology

## 2014-08-15 ENCOUNTER — Encounter (INDEPENDENT_AMBULATORY_CARE_PROVIDER_SITE_OTHER): Payer: Self-pay | Admitting: Internal Medicine

## 2014-08-26 ENCOUNTER — Inpatient Hospital Stay (HOSPITAL_COMMUNITY): Payer: BLUE CROSS/BLUE SHIELD

## 2014-08-26 ENCOUNTER — Encounter (HOSPITAL_COMMUNITY): Payer: Self-pay

## 2014-08-26 ENCOUNTER — Inpatient Hospital Stay (HOSPITAL_COMMUNITY)
Admission: EM | Admit: 2014-08-26 | Discharge: 2014-08-29 | DRG: 920 | Disposition: A | Discharge status: Home / Self Care | Payer: BLUE CROSS/BLUE SHIELD | Attending: Internal Medicine | Admitting: Internal Medicine

## 2014-08-26 DIAGNOSIS — I480 Paroxysmal atrial fibrillation: Secondary | ICD-10-CM | POA: Diagnosis present

## 2014-08-26 DIAGNOSIS — R509 Fever, unspecified: Secondary | ICD-10-CM | POA: Diagnosis present

## 2014-08-26 DIAGNOSIS — Z87442 Personal history of urinary calculi: Secondary | ICD-10-CM | POA: Diagnosis not present

## 2014-08-26 DIAGNOSIS — K5190 Ulcerative colitis, unspecified, without complications: Secondary | ICD-10-CM | POA: Diagnosis not present

## 2014-08-26 DIAGNOSIS — L02211 Cutaneous abscess of abdominal wall: Secondary | ICD-10-CM | POA: Diagnosis present

## 2014-08-26 DIAGNOSIS — Z86718 Personal history of other venous thrombosis and embolism: Secondary | ICD-10-CM | POA: Diagnosis not present

## 2014-08-26 DIAGNOSIS — N1330 Unspecified hydronephrosis: Secondary | ICD-10-CM | POA: Diagnosis present

## 2014-08-26 DIAGNOSIS — N200 Calculus of kidney: Secondary | ICD-10-CM

## 2014-08-26 DIAGNOSIS — M96831 Postprocedural hemorrhage and hematoma of a musculoskeletal structure following other procedure: Secondary | ICD-10-CM | POA: Diagnosis present

## 2014-08-26 DIAGNOSIS — S301XXA Contusion of abdominal wall, initial encounter: Secondary | ICD-10-CM | POA: Diagnosis present

## 2014-08-26 DIAGNOSIS — Z79891 Long term (current) use of opiate analgesic: Secondary | ICD-10-CM

## 2014-08-26 DIAGNOSIS — Y838 Other surgical procedures as the cause of abnormal reaction of the patient, or of later complication, without mention of misadventure at the time of the procedure: Secondary | ICD-10-CM | POA: Diagnosis present

## 2014-08-26 DIAGNOSIS — Z885 Allergy status to narcotic agent status: Secondary | ICD-10-CM | POA: Diagnosis not present

## 2014-08-26 DIAGNOSIS — Z880 Allergy status to penicillin: Secondary | ICD-10-CM | POA: Diagnosis not present

## 2014-08-26 DIAGNOSIS — Z8709 Personal history of other diseases of the respiratory system: Secondary | ICD-10-CM | POA: Diagnosis not present

## 2014-08-26 DIAGNOSIS — R1032 Left lower quadrant pain: Secondary | ICD-10-CM | POA: Diagnosis present

## 2014-08-26 DIAGNOSIS — Z8673 Personal history of transient ischemic attack (TIA), and cerebral infarction without residual deficits: Secondary | ICD-10-CM | POA: Diagnosis not present

## 2014-08-26 DIAGNOSIS — T814XXA Infection following a procedure, initial encounter: Secondary | ICD-10-CM | POA: Diagnosis present

## 2014-08-26 DIAGNOSIS — Z79899 Other long term (current) drug therapy: Secondary | ICD-10-CM

## 2014-08-26 DIAGNOSIS — N390 Urinary tract infection, site not specified: Secondary | ICD-10-CM | POA: Diagnosis not present

## 2014-08-26 DIAGNOSIS — E861 Hypovolemia: Secondary | ICD-10-CM | POA: Diagnosis present

## 2014-08-26 DIAGNOSIS — D72829 Elevated white blood cell count, unspecified: Secondary | ICD-10-CM | POA: Diagnosis present

## 2014-08-26 DIAGNOSIS — N179 Acute kidney failure, unspecified: Secondary | ICD-10-CM | POA: Diagnosis present

## 2014-08-26 DIAGNOSIS — Z9049 Acquired absence of other specified parts of digestive tract: Secondary | ICD-10-CM | POA: Diagnosis present

## 2014-08-26 DIAGNOSIS — Z7982 Long term (current) use of aspirin: Secondary | ICD-10-CM

## 2014-08-26 DIAGNOSIS — N281 Cyst of kidney, acquired: Secondary | ICD-10-CM | POA: Diagnosis present

## 2014-08-26 DIAGNOSIS — B998 Other infectious disease: Secondary | ICD-10-CM | POA: Diagnosis present

## 2014-08-26 DIAGNOSIS — Z881 Allergy status to other antibiotic agents status: Secondary | ICD-10-CM | POA: Diagnosis not present

## 2014-08-26 DIAGNOSIS — N132 Hydronephrosis with renal and ureteral calculous obstruction: Secondary | ICD-10-CM | POA: Diagnosis present

## 2014-08-26 DIAGNOSIS — L0291 Cutaneous abscess, unspecified: Secondary | ICD-10-CM | POA: Diagnosis not present

## 2014-08-26 DIAGNOSIS — Z8249 Family history of ischemic heart disease and other diseases of the circulatory system: Secondary | ICD-10-CM

## 2014-08-26 DIAGNOSIS — Z87891 Personal history of nicotine dependence: Secondary | ICD-10-CM | POA: Diagnosis not present

## 2014-08-26 DIAGNOSIS — Z888 Allergy status to other drugs, medicaments and biological substances status: Secondary | ICD-10-CM

## 2014-08-26 DIAGNOSIS — Z809 Family history of malignant neoplasm, unspecified: Secondary | ICD-10-CM | POA: Diagnosis not present

## 2014-08-26 HISTORY — DX: Other ascites: R18.8

## 2014-08-26 HISTORY — DX: Allergic rhinitis, unspecified: J30.9

## 2014-08-26 HISTORY — DX: Pneumonia, unspecified organism: J18.9

## 2014-08-26 HISTORY — DX: Nosocomial condition: Y95

## 2014-08-26 HISTORY — DX: Critical illness myopathy: G72.81

## 2014-08-26 HISTORY — DX: Enterocolitis due to Clostridium difficile, not specified as recurrent: A04.72

## 2014-08-26 HISTORY — DX: Pneumonitis due to inhalation of food and vomit: J69.0

## 2014-08-26 LAB — URINALYSIS, ROUTINE W REFLEX MICROSCOPIC
Bilirubin Urine: NEGATIVE
Glucose, UA: NEGATIVE mg/dL
Ketones, ur: NEGATIVE mg/dL
LEUKOCYTES UA: NEGATIVE
Nitrite: NEGATIVE
Protein, ur: 100 mg/dL — AB
Specific Gravity, Urine: 1.018 (ref 1.005–1.030)
Urobilinogen, UA: 0.2 mg/dL (ref 0.0–1.0)
pH: 6 (ref 5.0–8.0)

## 2014-08-26 LAB — COMPREHENSIVE METABOLIC PANEL
ALK PHOS: 78 U/L (ref 38–126)
ALT: 13 U/L — AB (ref 17–63)
ANION GAP: 10 (ref 5–15)
AST: 17 U/L (ref 15–41)
Albumin: 3.4 g/dL — ABNORMAL LOW (ref 3.5–5.0)
BUN: 17 mg/dL (ref 6–20)
CALCIUM: 8.3 mg/dL — AB (ref 8.9–10.3)
CO2: 23 mmol/L (ref 22–32)
Chloride: 100 mmol/L — ABNORMAL LOW (ref 101–111)
Creatinine, Ser: 1.48 mg/dL — ABNORMAL HIGH (ref 0.61–1.24)
GFR calc non Af Amer: 51 mL/min — ABNORMAL LOW (ref >60)
GFR, EST AFRICAN AMERICAN: 59 mL/min — AB (ref >60)
Glucose, Bld: 108 mg/dL — ABNORMAL HIGH (ref 65–99)
Potassium: 3.8 mmol/L (ref 3.5–5.1)
Sodium: 133 mmol/L — ABNORMAL LOW (ref 135–145)
Total Bilirubin: 0.7 mg/dL (ref 0.3–1.2)
Total Protein: 7.5 g/dL (ref 6.5–8.1)

## 2014-08-26 LAB — CBC WITH DIFFERENTIAL/PLATELET
BASOS PCT: 0 % (ref 0–1)
Basophils Absolute: 0 10*3/uL (ref 0.0–0.1)
EOS ABS: 0.1 10*3/uL (ref 0.0–0.7)
Eosinophils Relative: 1 % (ref 0–5)
HCT: 32.4 % — ABNORMAL LOW (ref 39.0–52.0)
Hemoglobin: 10.8 g/dL — ABNORMAL LOW (ref 13.0–17.0)
LYMPHS ABS: 2.4 10*3/uL (ref 0.7–4.0)
LYMPHS PCT: 12 % (ref 12–46)
MCH: 29.3 pg (ref 26.0–34.0)
MCHC: 33.3 g/dL (ref 30.0–36.0)
MCV: 88 fL (ref 78.0–100.0)
MONO ABS: 1.8 10*3/uL — AB (ref 0.1–1.0)
Monocytes Relative: 9 % (ref 3–12)
NEUTROS PCT: 78 % — AB (ref 43–77)
Neutro Abs: 14.8 10*3/uL — ABNORMAL HIGH (ref 1.7–7.7)
PLATELETS: 304 10*3/uL (ref 150–400)
RBC: 3.68 MIL/uL — AB (ref 4.22–5.81)
RDW: 13.9 % (ref 11.5–15.5)
WBC: 19.2 10*3/uL — AB (ref 4.0–10.5)

## 2014-08-26 LAB — PROTIME-INR
INR: 1.13 (ref 0.00–1.49)
PROTHROMBIN TIME: 14.7 s (ref 11.6–15.2)

## 2014-08-26 LAB — LIPASE, BLOOD: Lipase: 20 U/L — ABNORMAL LOW (ref 22–51)

## 2014-08-26 LAB — URINE MICROSCOPIC-ADD ON

## 2014-08-26 LAB — AMYLASE: Amylase: 54 U/L (ref 28–100)

## 2014-08-26 LAB — SEDIMENTATION RATE: SED RATE: 107 mm/h — AB (ref 0–16)

## 2014-08-26 MED ORDER — LEVOFLOXACIN IN D5W 750 MG/150ML IV SOLN
750.0000 mg | INTRAVENOUS | Status: DC
  Administered 2014-08-26 – 2014-08-28 (×3): 750 mg via INTRAVENOUS
  Filled 2014-08-26 (×3): qty 150

## 2014-08-26 MED ORDER — ENOXAPARIN SODIUM 40 MG/0.4ML ~~LOC~~ SOLN
40.0000 mg | Freq: Every day | SUBCUTANEOUS | Status: DC
  Administered 2014-08-26 – 2014-08-28 (×3): 40 mg via SUBCUTANEOUS
  Filled 2014-08-26 (×3): qty 0.4

## 2014-08-26 MED ORDER — VANCOMYCIN HCL IN DEXTROSE 750-5 MG/150ML-% IV SOLN
750.0000 mg | Freq: Two times a day (BID) | INTRAVENOUS | Status: DC
  Administered 2014-08-27 – 2014-08-28 (×4): 750 mg via INTRAVENOUS
  Filled 2014-08-26 (×5): qty 150

## 2014-08-26 MED ORDER — MESALAMINE 1000 MG RE SUPP
1000.0000 mg | Freq: Every day | RECTAL | Status: DC
  Administered 2014-08-26 – 2014-08-28 (×3): 1000 mg via RECTAL
  Filled 2014-08-26 (×4): qty 1

## 2014-08-26 MED ORDER — ACETAMINOPHEN 325 MG PO TABS
650.0000 mg | ORAL_TABLET | Freq: Four times a day (QID) | ORAL | Status: DC | PRN
  Administered 2014-08-26: 650 mg via ORAL
  Filled 2014-08-26: qty 2

## 2014-08-26 MED ORDER — VANCOMYCIN HCL IN DEXTROSE 1-5 GM/200ML-% IV SOLN
1000.0000 mg | INTRAVENOUS | Status: AC
  Administered 2014-08-26: 1000 mg via INTRAVENOUS
  Filled 2014-08-26: qty 200

## 2014-08-26 MED ORDER — PANTOPRAZOLE SODIUM 40 MG IV SOLR
40.0000 mg | Freq: Two times a day (BID) | INTRAVENOUS | Status: DC
  Administered 2014-08-26 – 2014-08-28 (×4): 40 mg via INTRAVENOUS
  Filled 2014-08-26 (×4): qty 40

## 2014-08-26 MED ORDER — ACETAMINOPHEN 650 MG RE SUPP: 650.0000 mg | Freq: Four times a day (QID) | RECTAL | Status: DC | PRN

## 2014-08-26 MED ORDER — DEXTROSE 5 % IV SOLN
2.0000 g | INTRAVENOUS | Status: DC
  Filled 2014-08-26: qty 2

## 2014-08-26 MED ORDER — MESALAMINE ER 250 MG PO CPCR
1000.0000 mg | ORAL_CAPSULE | Freq: Two times a day (BID) | ORAL | Status: DC
  Administered 2014-08-27 – 2014-08-29 (×5): 1000 mg via ORAL
  Filled 2014-08-26 (×7): qty 4

## 2014-08-26 MED ORDER — ADULT MULTIVITAMIN LIQUID CH
5.0000 mL | Freq: Every day | ORAL | Status: DC
  Administered 2014-08-27 – 2014-08-29 (×3): 5 mL via ORAL
  Filled 2014-08-26 (×3): qty 5

## 2014-08-26 MED ORDER — SODIUM CHLORIDE 0.9 % IV SOLN
INTRAVENOUS | Status: AC
  Administered 2014-08-26 – 2014-08-27 (×2): via INTRAVENOUS

## 2014-08-26 MED ORDER — SACCHAROMYCES BOULARDII 250 MG PO CAPS
250.0000 mg | ORAL_CAPSULE | Freq: Two times a day (BID) | ORAL | Status: DC
  Administered 2014-08-26 – 2014-08-29 (×6): 250 mg via ORAL
  Filled 2014-08-26 (×6): qty 1

## 2014-08-26 MED ORDER — DIPHENOXYLATE-ATROPINE 2.5-0.025 MG PO TABS
1.0000 | ORAL_TABLET | Freq: Three times a day (TID) | ORAL | Status: DC
  Administered 2014-08-27 – 2014-08-29 (×7): 1 via ORAL
  Filled 2014-08-26 (×8): qty 1

## 2014-08-26 MED ORDER — ONDANSETRON HCL 4 MG/2ML IJ SOLN: 4.0000 mg | Freq: Four times a day (QID) | INTRAMUSCULAR | Status: DC | PRN

## 2014-08-26 MED ORDER — ASPIRIN EC 81 MG PO TBEC
81.0000 mg | DELAYED_RELEASE_TABLET | Freq: Every day | ORAL | Status: DC
  Administered 2014-08-27 – 2014-08-29 (×3): 81 mg via ORAL
  Filled 2014-08-26 (×3): qty 1

## 2014-08-26 MED ORDER — SODIUM CHLORIDE 0.9 % IJ SOLN
3.0000 mL | Freq: Two times a day (BID) | INTRAMUSCULAR | Status: DC
  Administered 2014-08-26 – 2014-08-28 (×3): 3 mL via INTRAVENOUS

## 2014-08-26 MED ORDER — OXYCODONE-ACETAMINOPHEN 5-325 MG PO TABS
1.0000 | ORAL_TABLET | ORAL | Status: DC | PRN
  Administered 2014-08-27 (×2): 1 via ORAL
  Filled 2014-08-26 (×3): qty 1

## 2014-08-26 NOTE — ED Notes (Signed)
MD - Dr. Maudie Mercury at bedside.

## 2014-08-26 NOTE — Consult Note (Signed)
[facility]  [address] [place]  [place], [place] [address] Phone: [phone] FAX: [phone]     Connor Small  [birth year] [id number]  CARE TEAM:  PCP: Eartha Inch, MD  Outpatient Care Team: Patient Care Team: Eartha Inch, MD as PCP - General (Family Medicine) Jonelle Sidle, MD as Consulting Physician (Cardiology) Sebastian Ache, MD as Consulting Physician (Urology) Glenna Fellows, MD as Consulting Physician (General Surgery) Malissa Hippo, MD as Consulting Physician (Gastroenterology)  Inpatient Treatment Team: Treatment Team: Attending Provider: Alvira Monday, MD; Technician: Ricki Rodriguez, NT; Registered Nurse: Wynelle Link, RN; Consulting Physician: Sebastian Ache, MD; Consulting Physician: Bishop Limbo, MD; Registered Nurse: Dawna Part, RN  This patient is a 54 y.o.male who presents today for surgical evaluation at the request of Alvira Monday, MD.   Reason for evaluation: Fever ? 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  Refractory to cecostomy & nneded abdominal colectomy 02/28/2012.  Transferred to [facility] with washouts & closure.  Ileostomy takedown 05/27/2013 by Dr Johna Sheriff.  Small suture removed from incision Jan 2016.  No abd events.  Massive kidney stones with some hydronephrosis noted.  Began again deconditioned.  Underwent stenting and lithotripsy.  Eventually improved.  Stents removed a few weeks ago.  Denies any hematuria or dysuria at this time.  Was able to physically discuss with Dr. Berneice Heinrich, the supervising urologist.  Dr. Berneice Heinrich felt like he had recovered well from the interventions in this past month.  However the patient did notice some left flank discomfort while he was working in the garage this weekend, 4 days ago.  Then began to have low-grade and high-grade fevers.  103F.  Discussed with his primary care physician yesterday.  CT  scan done today shows worsening thickening of his deep abdominal wall in the left upper quadrant.  Apparently these had in abscess or hematoma/fluid collection in his left upper quadrant since all these emergency surgeries 2 years ago.  Last CAT scan 2 months ago showed mild or thickening.  CAT scan today shows slightly increased thickening.  A emergency room for evaluation to rule out abscess and see if surgery is needed.  Patient normally has 5-7 loose bowel movement once a day controlled with Lomotil since he had his abdominal colectomy and now has an ileo-rectal anastomosis.  Perhaps some mild thickening stricturing at the anastomosis but not severe.  On pentazocine followed by Dr. remodeling with gastroenterology without any major changes.  No sick contacts or travel history.  No irritable bowel.  Appetite has been down with fevers but no severe nausea or vomiting.  No hematemesis.  No bleeding. His wife is at the bedside.  So is the emergency room nurse per.  So is the internal medicine hospitalist, Dr. Selena Batten.  Patient notes he does not really have abdominal "pain."  Just some discomfort.  Past Medical History  Diagnosis Date  . Chronic diarrhea   . Rectal bleed   . Hemorrhoids   . Ulcerative colitis followed by dr Karilyn Cota    Distal UC dx 2004  . History of stroke March 2014 --  residual right weaker than prior to cva, unable to make fist    Bilateral cerebral and right cerebellar infarcts - felt to be embolic vs hypotensive (in setting of septic shock, transient a-fib, ARDS)  . History of DVT (deep vein thrombosis)     2014--  right upper  extremity  . 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 History of acute renal failure     post-op  total colectomy 02-27-2012--  required dialysis--  resolved  . History of ARDS     02-27-2012  post op total colectomy -- septic shock-  required  intubation/ventilator for 3-4 wks  . History of pleural effusion     acute- bilateral in setting septic shock post op Feb 2014  . History of Clostridium difficile colitis     02-27-2012--  fulminant C.Diff colitis caused toxic megacolon--  s/p  total colectomy  . Renal calculi     bilateral--  left > right  . History of atrial fibrillation     02/ 2014--  no issues since  . Urgency of urination   . HAP (hospital-acquired pneumonia) 04/21/2012  . Clostridium difficile colitis 04/26/2012  . Aspiration pneumonitis 04/26/2012  . Allergic rhinitis 06/28/2012  . Ascites s/p US guide paracentesis (4/2) 04/25/2012    Past Surgical History  Procedure Laterality Date  . Colonoscopy  02/16/2011    Procedure: COLONOSCOPY;  Surgeon: Malissa Hippo, MD;  [facility]: [facility];  Service: Endoscopy;  Laterality: N/A;  100  . Flexible sigmoidoscopy  02/27/2012    Procedure: FLEXIBLE SIGMOIDOSCOPY;  Surgeon: Malissa Hippo, MD;  [facility]: [facility];  Service: Endoscopy;  Laterality: N/A;  with colonic decompression  . Laparotomy  02/27/2012    Procedure: EXPLORATORY LAPAROTOMY;  Surgeon: Fabio Bering, MD;  Location: [facility];  Service: General;  Laterality: N/A;  . Cecostomy  02/27/2012    Procedure: CECOSTOMY;  Surgeon: Fabio Bering, MD;  Location: [facility];  Service: General;  Laterality: N/A;  Cecostomy Tube Placement  . Colectomy  02/28/2012    Procedure: TOTAL COLECTOMY;  Surgeon: Fabio Bering, MD;  Location: [facility];  Service: General;  Laterality: N/A;  . Ileostomy  02/28/2012    Procedure: ILEOSTOMY;  Surgeon: Fabio Bering, MD;  Location: [facility];  Service: General;  Laterality: N/A;  . Cystoscopy w/ ureteral stent placement Bilateral 03/20/2012    Procedure: CYSTOSCOPY WITH RETROGRADE PYELOGRAM/;  Surgeon: Sebastian Ache, MD;  Location: [facility];  Service: Urology;  Laterality: Bilateral. Pt reports no stents were placed.  . Laparotomy N/A 03/26/2012    Procedure: EXPLORATORY LAPAROTOMY;  Surgeon:  Mariella Saa, MD;  Location: [facility];  Service: General;  Laterality: N/A;  . Application of wound vac N/A 03/26/2012    Procedure: APPLICATION OF WOUND VAC;  Surgeon: Mariella Saa, MD;  Location: [facility];  Service: General;  Laterality: N/A;  . Liver biopsy N/A 03/26/2012    Procedure: LIVER BIOPSY;  Surgeon: Mariella Saa, MD;  Location: [facility];  Service: General;  Laterality: N/A;  . Laparotomy N/A 03/29/2012    Procedure: EXPLORATORY LAPAROTOMY, PARTIAL WOUND CLOSURE;  Surgeon: Mariella Saa, MD;  Location: [facility];  Service: General;  Laterality: N/A;  . Vacuum assisted closure change N/A 03/29/2012    Procedure: Open ABDOMINAL VACUUM  CHANGE;  Surgeon: Mariella Saa, MD;  Location: [facility];  Service: General;  Laterality: N/A;  . Vacuum assisted closure change N/A 04/01/2012    Procedure: removal of abdominal vac dressing and abdominal closure;  Surgeon: Mariella Saa, MD;  Location: [facility];  Service: General;  Laterality: N/A;  . Flexible sigmoidoscopy N/A  02/06/2013    Procedure: FLEXIBLE SIGMOIDOSCOPY;  Surgeon: Malissa Hippo, MD;  [facility];  Service: Endoscopy;  Laterality: N/A;  100-moved to 1200 Ann notified pt  . Flexible sigmoidoscopy N/A 03/27/2013    Procedure: FLEXIBLE SIGMOIDOSCOPY;  Surgeon: Malissa Hippo, MD;  [facility];  Service: Endoscopy;  Laterality: N/A;  225  . Ileostomy closure N/A 05/27/2013    Procedure: ILEOSTOMY TAKEDOWN WITH ILEOPROCTOSTOMY;  Surgeon: Mariella Saa, MD;  Location: [facility];  Service: General;  Laterality: N/A;  . Flexible sigmoidoscopy N/A 12/11/2013    Procedure: FLEXIBLE SIGMOIDOSCOPY;  Surgeon: Malissa Hippo, MD;  [facility];  Service: Endoscopy;  Laterality: N/A;  200  . Cystoscopy with retrograde pyelogram, ureteroscopy and stent placement Left 07/16/2014    Procedure: CYSTOSCOPY WITH RETROGRADE PYELOGRAM,  AND LEFT STENT PLACEMENT;  Surgeon: Sebastian Ache, MD;  Location: [facility];   Service: Urology;  Laterality: Left;  . Sigmoid colectomy /  low anterior resection/  end sigmoid colostomy  11-07-2010   [facility]    Sigmoid colonic perferation---  TAKE DOWN COLOSTOMY,  Jan 2013  . Transthoracic echocardiogram  03-26-2012  (in setting of septic shock)    report states  poorly visualized--  normal LVSF, ef 50-55% and no pericardial effusion  . Cystoscopy with retrograde pyelogram, ureteroscopy and stent placement Bilateral 07/25/2014    Procedure: CYSTOSCOPY WITH RETROGRADE PYELOGRAM, URETEROSCOPY AND BILATERAL STENT PLACEMENT;  Surgeon: Sebastian Ache, MD;  Location: [facility];  Service: Urology;  Laterality: Bilateral;  . Holmium laser application Left 07/25/2014    Procedure: HOLMIUM LASER APPLICATION, LEFT ONLY ;  Surgeon: Sebastian Ache, MD;  Location: [facility];  Service: Urology;  Laterality: Left;    History   Social History  . Marital Status: Married    Spouse Name: [name]  . Number of Children: 1  . 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 Smoking status: Former Smoker -- 0.50 packs/day for 12 years    Types: Cigarettes    Quit date: 07/17/1984  . Smokeless tobacco: Never Used  . Alcohol Use: No  . Drug Use: No  . Sexual Activity: Not on file   Other Topics Concern  . Not on file   Social History Narrative    Family History  Problem Relation Age of Onset  . Cancer Sister   . Healthy Daughter   . Hypertension Mother   . Colon cancer Neg Hx     No current facility-administered medications for this encounter.   Current Outpatient Prescriptions  Medication Sig Dispense Refill  . aspirin EC 81 MG tablet Take 81 mg by mouth daily.    . cholestyramine (QUESTRAN) 4 G packet Take 1 packet (4 g total) by mouth 2 (two) times daily. (Patient not taking: Reported on 07/16/2014) 60 each 2  . ciprofloxacin (CIPRO) 500 MG tablet Take 1 tablet (500 mg total)  by mouth 2 (two) times daily. X 3 days. Begin day prior to next Urology appointment. 6 tablet 0  . diphenoxylate-atropine (LOMOTIL) 2.5-0.025 MG per tablet Take 1 tablet by mouth 4 (four) times daily. (Patient taking differently: Take 1 tablet by mouth 3 (three) times daily. ) 120 tablet 3  . mesalamine (CANASA) 1000 MG suppository Place 1 suppository (1,000 mg total) rectally at bedtime. 30 suppository 11  . mesalamine (PENTASA) 500 MG CR capsule Take 2 capsules (  1,000 mg total) by mouth 2 (two) times daily. Patient takes 2 by mouth twice daily 360 capsule 3  . Multiple Vitamin (MULTIVITAMIN) LIQD Take 5 mLs by mouth daily.    Marland Kitchen oxyCODONE-acetaminophen (PERCOCET/ROXICET) 5-325 MG per tablet Take 1 tablet by mouth every 4 (four) hours as needed for moderate pain or severe pain. 30 tablet 0  . promethazine (PHENERGAN) 12.5 MG tablet Take 1 tablet (12.5 mg total) by mouth every 6 (six) hours as needed for nausea or vomiting. 30 tablet 0  . saccharomyces boulardii (FLORASTOR) 250 MG capsule Take 250 mg by mouth 2 (two) times daily.        Allergies  Allergen Reactions  . Penicillins Other (See Comments)    "Heart rate slowed down"  bradycardia  . Clindamycin/Lincomycin Diarrhea    C-diff  . Morphine And Related Nausea And Vomiting  . Claritin [Loratadine] Rash  . Primaxin [Imipenem] Rash    ROS: Constitutional:  No fevers, chills, sweats.  Weight stable Eyes:  No vision changes, No discharge HENT:  No sore throats, nasal drainage Lymph: No neck swelling, No bruising easily Pulmonary:  No cough, productive sputum CV: No orthopnea, PND  Patient walks 20 minutes for about 1 miles without difficulty.  No exertional chest/neck/shoulder/arm pain. GI: No personal nor family history of GI/colon cancer, irritable bowel syndrome, allergy such as Celiac Sprue, dietary/dairy problems, colitis, ulcers nor gastritis.  No recent sick contacts/gastroenteritis.  No travel outside the country.  No changes in  diet. Renal: No UTIs, No hematuria Genital:  No drainage, bleeding, masses Musculoskeletal: No severe joint pain.  Good ROM major joints Skin:  No sores or lesions.  No rashes Heme/Lymph:  No easy bleeding.  No swollen lymph nodes Neuro: No focal weakness/numbness.  No seizures Psych: No suicidal ideation.  No hallucinations  BP 116/96 mmHg  Pulse 92  Temp(Src) 98.9 F (37.2 C) (Oral)  Resp 20  Wt 71.215 kg (157 lb)  SpO2 98%  Physical Exam: General: Pt awake/alert/oriented x4 in no major acute distress Eyes: PERRL, normal EOM. Sclera nonicteric Neuro: CN II-XII intact w/o focal sensory/motor deficits. Lymph: No head/neck/groin lymphadenopathy Psych:  No delerium/psychosis/paranoia.  Quite chatty but pleasant. HENT: Normocephalic, Mucus membranes moist.  No thrush Neck: Supple, No tracheal deviation Chest: No pain.  Good respiratory excursion. CV:  Pulses intact.  Regular rhythm Abdomen: Soft, Nondistended.  Mild tenderness to palpation left upper quadrant/flank along anterior axillary line.  Not right at costal ridge though.  No guarding.  No rebound tenderness.  Midline incision and old ostomy incision well-healed.  No drainage.  No fluctuance.  Does feel somewhat warm all over but no persistent pacific hotter fluctuant or erythematous area.  No asymmetry of the abdominal wall.  No crepitus.  Rest of abdomen is nontender.  No incarcerated hernias. Ext:  SCDs BLE.  No significant edema.  No cyanosis Skin: No petechiae / purpurea.  No major sores Musculoskeletal: No severe joint pain.  Good ROM major joints   Results:   Labs: Results for orders placed or performed during the hospital encounter of 08/26/14 (from the past 48 hour(s))  CBC with Differential     Status: Abnormal   Collection Time: 08/26/14  6:49 PM  Result Value Ref Range   WBC 19.2 (H) 4.0 - 10.5 K/uL   RBC 3.68 (L) 4.22 - 5.81 MIL/uL   Hemoglobin 10.8 (L) 13.0 - 17.0 g/dL   HCT 32.4 (L) 40.1 - 02.7 %   MCV  88.0  78.0 - 100.0 fL   MCH 29.3 26.0 - 34.0 pg   MCHC 33.3 30.0 - 36.0 g/dL   RDW 16.1 09.6 - 04.5 %   Platelets 304 150 - 400 K/uL   Neutrophils Relative % 78 (H) 43 - 77 %   Neutro Abs 14.8 (H) 1.7 - 7.7 K/uL   Lymphocytes Relative 12 12 - 46 %   Lymphs Abs 2.4 0.7 - 4.0 K/uL   Monocytes Relative 9 3 - 12 %   Monocytes Absolute 1.8 (H) 0.1 - 1.0 K/uL   Eosinophils Relative 1 0 - 5 %   Eosinophils Absolute 0.1 0.0 - 0.7 K/uL   Basophils Relative 0 0 - 1 %   Basophils Absolute 0.0 0.0 - 0.1 K/uL    Imaging / Studies: No results found.   CLINICAL HISTORY: HEMATURIA, ABDOMEN TENDERNESS, FEVER       COMPARISON: 07/04/2014   TECHNIQUE: Multiple axial images through the abdomen and pelvis were was performed without IV contrast. Coronal and sagittal reconstructions were obtained and reviewed. Lack of IV contrast limits evaluation of abdominal viscera and vascular structures.   One or more of the following techniques were utilized for dose reduction: Automated exposure control. Adjustment of the MA or KV according to patient size. Use of iterative image reconstruction technique.  FINDINGS:  Imaged Lower Thorax: Left pleural thickening may represent scar or effusion. Rounded atelectasis in the left lower lobe is stable.  Abdomen/Pelvis: Resolution of left-sided hydronephrosis. There is some parenchymal calcification in the upper pole of the left kidney with probable associated cyst which is stable. The rest of the left renal calculi have been removed. Punctate right lower pole renal  calculus. No right ureteral calculus or obstruction. Bladder is fluid-filled. Normal prostate and seminal vesicles. Normal adrenal glands and pancreas. The liver is normal. Cholelithiasis without acute cholecystitis.  Status post colectomy. History of colostomy with reversal.  There is abnormal thickening of the left transverse abdominis muscle measuring 6.1 x 2.5 cm image 49 compared to 4.9 x 1.3 cm  on the previous study. There is some stranding of the fat around this muscle.  Some of these changes could be postoperative  scarring however the increased size is concerning for superimposed hematoma or infection. These changes extend superiorly along the left lateral abdominal wall. No significant bowel distention. No free air. Bones are intact.   IMPRESSION: Resolution left-sided hydronephrosis and renal calculi.  Presumed complex cyst of the left kidney.  Punctate nonobstructive right renal calculus.  Postoperative changes in the abdomen status post colectomy.  Increasing thickness of the left transversalis abdominals muscle suggest possibility of abscess given the clinical history versus hematoma.  These findings were called directly to Dr. Tenna Delaine office and given to Alfonse Ras at the time of this dictation.  ################################################################################# #################COMPLETED CODE CRITICAL REPORT################## #################################################################################  Electronically Signed by Nicoletta Dress   Medications / Allergies: per chart  Antibiotics: Anti-infectives    None      Assessment  Connor Small  54 y.o. male       Problem List:  Principal Problem:   UTI (lower urinary tract infection) Active Problems:   Leukocytosis   Renal calculi   Ulcerative colitis   Fever   High fever of unknown origin.  Mildly dirty UA but no raging UTI.  Abdominal discomfort and thickening of transversus abdominis muscle.  4.5 centimeters to now 6 cm.  Plan:  I am not exactly certain what is going on.  His story  of pain and discomfort with thickening is of abdominal wall musculature seems more like a hematoma or muscle strain.  There is no definite abscess or fluctuance.  Reviewed CT scans in detail below with the reports.  Compared to prior CT scans.  It is slightly thicker but not massively  so.  He has had abnormal thickening of his abdominal wall and left upper quadrant for 2 years since his numerous surgeries.  Agree with admission for observation and fever workup.  Agree with antibiotics for now.  If fever and culture workup are completely negative, consider aspiration or drainage of abdominal wall thickening to make sure there is no abscess there.  It would seem unusual 1.5 years from prior last abdominal surgery & 2.5 years since his emergency surgeries.  However, he did have some urinary tract issues from his numerous stones or this area has been colonized/seated.  Anti-inflammatory's and heat for probable muscle strain.  Given his history of inflammatory bowel disease, cannot do classic non-startles.  Therefore Tylenol around-the-clock.  Stretching.  He does not look toxic.  He is not in septic shock.  Do not think he requires emergency surgery at this time.  There is no specific area of fluctuance.  It seems rather deep on the abdominal wall so this is certainly not something I would do at the bedside.  We will be available to follow.  Patient wife seemed reassured.  -VTE prophylaxis- SCDs, etc  -mobilize as tolerated to help recovery    Ardeth Sportsman, M.D., F.A.C.S. Gastrointestinal and Minimally Invasive Surgery [facility] [address] [place], [place] [address] [phone] Main / Paging   08/26/2014  Note: Portions of this report may have been transcribed using voice recognition software. Every effort was made to ensure accuracy; however, inadvertent computerized transcription errors may be present.   Any transcriptional errors that result from this process are unintentional.

## 2014-08-26 NOTE — Progress Notes (Signed)
ANTIBIOTIC CONSULT NOTE - INITIAL  Pharmacy Consult for Vancomycin and Levaquin Indication: UTI, abscess  Allergies  Allergen Reactions  . Penicillins Other (See Comments)    "Heart rate slowed down"  bradycardia  . Clindamycin/Lincomycin Diarrhea    C-diff  . Morphine And Related Nausea And Vomiting  . Claritin [Loratadine] Rash  . Primaxin [Imipenem] Rash    Patient Measurements: Weight: 157 lb (71.215 kg)  Vital Signs: Temp: 98.9 F (37.2 C) (08/02 1751) Temp Source: Oral (08/02 1751) BP: 116/96 mmHg (08/02 1751) Pulse Rate: 92 (08/02 1751) Intake/Output from previous day:   Intake/Output from this shift:    Labs:  Recent Labs  08/26/14 1849  WBC 19.2*  HGB 10.8*  PLT 304   CrCl cannot be calculated (Patient has no serum creatinine result on file.). No results for input(s): VANCOTROUGH, VANCOPEAK, VANCORANDOM, GENTTROUGH, GENTPEAK, GENTRANDOM, TOBRATROUGH, TOBRAPEAK, TOBRARND, AMIKACINPEAK, AMIKACINTROU, AMIKACIN in the last 72 hours.   Microbiology: No results found for this or any previous visit (from the past 720 hour(s)).  Medical History: Past Medical History  Diagnosis Date  . Chronic diarrhea   . Rectal bleed   . Hemorrhoids   . Ulcerative colitis followed by dr Laural Golden    Distal UC dx 2004  . History of stroke March 2014 --  residual right weaker than prior to cva, unable to make fist    Bilateral cerebral and right cerebellar infarcts - felt to be embolic vs hypotensive (in setting of septic shock, transient a-fib, ARDS)  . History of DVT (deep vein thrombosis)     2014--  right upper extremity  . 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 History of acute renal failure     post-op  total colectomy 02-27-2012--  required dialysis--  resolved  . History of ARDS     02-27-2012  post op total colectomy -- septic shock-  required  intubation/ventilator for 3-4 wks  . History of pleural effusion     acute- bilateral in setting septic shock post op Feb 2014  . History of Clostridium difficile colitis     02-27-2012--  fulminant C.Diff colitis caused toxic megacolon--  s/p  total colectomy  . Renal calculi     bilateral--  left > right  . History of atrial fibrillation     02/ 2014--  no issues since  . Urgency of urination   . HAP (hospital-acquired pneumonia) 04/21/2012  . Clostridium difficile colitis 04/26/2012  . Aspiration pneumonitis 04/26/2012  . Allergic rhinitis 06/28/2012  . Ascites s/p US guide paracentesis (4/2) 04/25/2012    Medications:  Scheduled:   Infusions:  . cefTAZidime (FORTAZ)  IV    . vancomycin     PRN:   Assessment: 54 yo male presenting with abdominal pain and fever. CT scan yesterday shows abscess vs hematoma. Pharmacy is consulted to dose vancomycin and ceftazidime for intra-abdominal infection.  8/2 >> Vancomycin >> 8/2 >> Levaquin >>  Tmax: 103.5 WBC: 19.2 Renal: SCr 1.48 (baseline ~1.9 recently), CrCl ~ 55 ml/min  8/2 blood x 2:   Goal of Therapy:  Vancomycin trough level 15-20 mcg/ml  Antibiotic doses appropriate for indication and renal function  Plan:   Vancomycin 1g IV given in ED, then 742m IV q12h Check trough at steady state  Levaquin 7531mIV q24h Follow up renal function & cultures, clinical course  Peggyann Juba, PharmD, BCPS Pager: [phone] 08/26/2014,7:29 PM

## 2014-08-26 NOTE — H&P (Addendum)
Connor Small is an 54 y.o. male.    Veronia Beets (pcp)   Chief Complaint:   fever HPI: 54 yo male with fever starting on Friday,  Intermittent, + abdominal pain left lower quadrant, near scar,  Pt had a CT scan yesterday, and sent the results to pcp  => ? Abscess vs hematoma.  Pt was sent to the ED for evaluation.  Pt denies cough, cp, palp, sob, flank pain, dysuria, hematuria.  Pt was noted to have uti.  ED has consulted surgery for evaluation of abscess vs hematoma.  Pt will be admitted for w/up of fever  Past Medical History  Diagnosis Date  . Chronic diarrhea   . Rectal bleed   . Hemorrhoids   . Ulcerative colitis followed by dr Laural Golden    Distal UC dx 2004  . History of stroke March 2014 --  residual right weaker than prior to cva, unable to make fist    Bilateral cerebral and right cerebellar infarcts - felt to be embolic vs hypotensive (in setting of septic shock, transient a-fib, ARDS)  . History of DVT (deep vein thrombosis)     2014--  right upper extremity  . 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 History of acute renal failure     post-op  total colectomy 02-27-2012--  required dialysis--  resolved  . History of ARDS     02-27-2012  post op total colectomy -- septic shock-  required intubation/ventilator for 3-4 wks  . History of pleural effusion     acute- bilateral in setting septic shock post op Feb 2014  . History of Clostridium difficile colitis     02-27-2012--  fulminant C.Diff colitis caused toxic megacolon--  s/p  total colectomy  . Renal calculi     bilateral--  left > right  . History of atrial fibrillation     02/ 2014--  no issues since  . Urgency of urination   . HAP (hospital-acquired pneumonia) 04/21/2012  . Clostridium difficile colitis 04/26/2012  . Aspiration pneumonitis 04/26/2012  . Allergic rhinitis 06/28/2012  . Ascites s/p US guide  paracentesis (4/2) 04/25/2012    Past Surgical History  Procedure Laterality Date  . Colonoscopy  02/16/2011    Procedure: COLONOSCOPY;  Surgeon: Rogene Houston, MD;  [facility];  Service: Endoscopy;  Laterality: N/A;  100  . Flexible sigmoidoscopy  02/27/2012    Procedure: FLEXIBLE SIGMOIDOSCOPY;  Surgeon: Rogene Houston, MD;  [facility];  Service: Endoscopy;  Laterality: N/A;  with colonic decompression  . Laparotomy  02/27/2012    Procedure: EXPLORATORY LAPAROTOMY;  Surgeon: Donato Heinz, MD;  [facility];  Service: General;  Laterality: N/A;  . Cecostomy  02/27/2012    Procedure: CECOSTOMY;  Surgeon: Donato Heinz, MD;  [facility];  Service: General;  Laterality: N/A;  Cecostomy Tube Placement  . Colectomy  02/28/2012    Procedure: TOTAL COLECTOMY;  Surgeon: Donato Heinz, MD;  [facility];  Service: General;  Laterality: N/A;  . Ileostomy  02/28/2012    Procedure: ILEOSTOMY;  Surgeon: Donato Heinz, MD;  [facility];  Service: General;  Laterality: N/A;  . Cystoscopy w/ ureteral stent placement Bilateral 03/20/2012    Procedure: CYSTOSCOPY WITH RETROGRADE PYELOGRAM/;  Surgeon: Alexis Frock, MD;  Location: [place];  Service:  Urology;  Laterality: Bilateral. Pt reports no stents were placed.  . Laparotomy N/A 03/26/2012    Procedure: EXPLORATORY LAPAROTOMY;  Surgeon: Edward Jolly, MD;  Location: [place];  Service: General;  Laterality: N/A;  . Application of wound vac N/A 03/26/2012    Procedure: APPLICATION OF WOUND VAC;  Surgeon: Edward Jolly, MD;  [facility];  Service: General;  Laterality: N/A;  . Liver biopsy N/A 03/26/2012    Procedure: LIVER BIOPSY;  Surgeon: Edward Jolly, MD;  Location: [place];  Service: General;  Laterality: N/A;  . Laparotomy N/A 03/29/2012    Procedure: EXPLORATORY LAPAROTOMY, PARTIAL WOUND CLOSURE;  Surgeon: Edward Jolly, MD;  Location: [place];  Service: General;  Laterality: N/A;  . Vacuum  assisted closure change N/A 03/29/2012    Procedure: Open ABDOMINAL VACUUM  CHANGE;  Surgeon: Edward Jolly, MD;  Location: [place];  Service: General;  Laterality: N/A;  . Vacuum assisted closure change N/A 04/01/2012    Procedure: removal of abdominal vac dressing and abdominal closure;  Surgeon: Edward Jolly, MD;  Location: [place];  Service: General;  Laterality: N/A;  . Flexible sigmoidoscopy N/A 02/06/2013    Procedure: FLEXIBLE SIGMOIDOSCOPY;  Surgeon: Rogene Houston, MD;  [facility];  Service: Endoscopy;  Laterality: N/A;  100-moved to 1200 Ann notified pt  . Flexible sigmoidoscopy N/A 03/27/2013    Procedure: FLEXIBLE SIGMOIDOSCOPY;  Surgeon: Rogene Houston, MD;  [facility];  Service: Endoscopy;  Laterality: N/A;  225  . Ileostomy closure N/A 05/27/2013    Procedure: ILEOSTOMY TAKEDOWN WITH ILEOPROCTOSTOMY;  Surgeon: Edward Jolly, MD;  [facility];  Service: General;  Laterality: N/A;  . Flexible sigmoidoscopy N/A 12/11/2013    Procedure: FLEXIBLE SIGMOIDOSCOPY;  Surgeon: Rogene Houston, MD;  [facility];  Service: Endoscopy;  Laterality: N/A;  200  . Cystoscopy with retrograde pyelogram, ureteroscopy and stent placement Left 07/16/2014    Procedure: CYSTOSCOPY WITH RETROGRADE PYELOGRAM,  AND LEFT STENT PLACEMENT;  Surgeon: Alexis Frock, MD;  [facility];  Service: Urology;  Laterality: Left;  . Sigmoid colectomy /  low anterior resection/  end sigmoid colostomy  11-07-2010   [facility]    Sigmoid colonic perferation---  TAKE DOWN COLOSTOMY,  Jan 2013  . Transthoracic echocardiogram  03-26-2012  (in setting of septic shock)    report states  poorly visualized--  normal LVSF, ef 50-55% and no pericardial effusion  . Cystoscopy with retrograde pyelogram, ureteroscopy and stent placement Bilateral 07/25/2014    Procedure: CYSTOSCOPY WITH RETROGRADE PYELOGRAM, URETEROSCOPY AND BILATERAL STENT PLACEMENT;  Surgeon: Alexis Frock, MD;   Location: [facility];  Service: Urology;  Laterality: Bilateral;  . Holmium laser application Left 08/24/2749    Procedure: HOLMIUM LASER APPLICATION, LEFT ONLY ;  Surgeon: Alexis Frock, MD;  Location: [facility];  Service: Urology;  Laterality: Left;    Family History  Problem Relation Age of Onset  . Cancer Sister   . Healthy Daughter   . Hypertension Mother   . Colon cancer Neg Hx    Social History:  reports that he quit smoking about 30 years ago. His smoking use included Cigarettes. He has a 6 pack-year smoking history. He has never used smokeless tobacco. He reports that he does not drink alcohol or use illicit drugs.  Allergies:  Allergies  Allergen Reactions  . Penicillins Other (See Comments)    "Heart rate slowed down"  bradycardia  . Clindamycin/Lincomycin Diarrhea    C-diff  . Morphine And Related Nausea And Vomiting  . Claritin [Loratadine] Rash  . Primaxin [Imipenem] Rash   Medications reviewed   Results for orders placed or performed during the hospital encounter of 08/26/14 (from the past 48 hour(s))  Protime-INR     Status: None   Collection Time: 08/26/14  6:21 PM  Result Value Ref Range   Prothrombin Time 14.7 11.6 - 15.2 seconds   INR 1.13 0.00 - 1.49  Urinalysis, Routine w reflex microscopic (not at [facility])     Status: Abnormal   Collection Time: 08/26/14  6:43 PM  Result Value Ref Range   Color, Urine YELLOW YELLOW   APPearance CLEAR CLEAR   Specific Gravity, Urine 1.018 1.005 - 1.030   pH 6.0 5.0 - 8.0   Glucose, UA NEGATIVE NEGATIVE mg/dL   Hgb urine dipstick LARGE (A) NEGATIVE   Bilirubin Urine NEGATIVE NEGATIVE   Ketones, ur NEGATIVE NEGATIVE mg/dL   Protein, ur 100 (A) NEGATIVE mg/dL   Urobilinogen, UA 0.2 0.0 - 1.0 mg/dL   Nitrite NEGATIVE NEGATIVE   Leukocytes, UA NEGATIVE NEGATIVE  Urine microscopic-add on     Status: Abnormal   Collection Time: 08/26/14  6:43 PM  Result Value Ref Range   Squamous  Epithelial / LPF RARE RARE   WBC, UA 11-20 <3 WBC/hpf   RBC / HPF 7-10 <3 RBC/hpf   Bacteria, UA MANY (A) RARE  CBC with Differential     Status: Abnormal   Collection Time: 08/26/14  6:49 PM  Result Value Ref Range   WBC 19.2 (H) 4.0 - 10.5 K/uL   RBC 3.68 (L) 4.22 - 5.81 MIL/uL   Hemoglobin 10.8 (L) 13.0 - 17.0 g/dL   HCT 32.4 (L) 39.0 - 52.0 %   MCV 88.0 78.0 - 100.0 fL   MCH 29.3 26.0 - 34.0 pg   MCHC 33.3 30.0 - 36.0 g/dL   RDW 13.9 11.5 - 15.5 %   Platelets 304 150 - 400 K/uL   Neutrophils Relative % 78 (H) 43 - 77 %   Neutro Abs 14.8 (H) 1.7 - 7.7 K/uL   Lymphocytes Relative 12 12 - 46 %   Lymphs Abs 2.4 0.7 - 4.0 K/uL   Monocytes Relative 9 3 - 12 %   Monocytes Absolute 1.8 (H) 0.1 - 1.0 K/uL   Eosinophils Relative 1 0 - 5 %   Eosinophils Absolute 0.1 0.0 - 0.7 K/uL   Basophils Relative 0 0 - 1 %   Basophils Absolute 0.0 0.0 - 0.1 K/uL   No results found.  Review of Systems  Constitutional: Positive for fever and chills. Negative for weight loss, malaise/fatigue and diaphoresis.  HENT: Negative.   Eyes: Negative.   Respiratory: Negative.   Cardiovascular: Negative.   Gastrointestinal: Positive for abdominal pain. Negative for heartburn, nausea, vomiting, diarrhea, constipation, blood in stool and melena.  Genitourinary: Negative.   Musculoskeletal: Negative.   Skin: Negative.   Neurological: Negative.  Negative for weakness.  Endo/Heme/Allergies: Negative.   Psychiatric/Behavioral: Negative.     Blood pressure 116/96, pulse 92, temperature 98.9 F (37.2 C), temperature source Oral, resp. rate 20, weight 71.215 kg (157 lb), SpO2 98 %. Physical Exam  Constitutional: He is oriented to person, place, and time. He appears well-developed and well-nourished.  HENT:  Head: Normocephalic and atraumatic.  Eyes: Conjunctivae and EOM are normal. Pupils are equal, round, and reactive to light. No scleral icterus.  Neck:  Normal range of motion. Neck supple. No JVD  present. No tracheal deviation present. No thyromegaly present.  Cardiovascular: Normal rate and regular rhythm.  Exam reveals no gallop and no friction rub.   No murmur heard. Respiratory: Effort normal and breath sounds normal. No respiratory distress. He has no wheezes. He has no rales. He exhibits no tenderness.  GI: Soft. Bowel sounds are normal. He exhibits no distension. There is no tenderness. There is no rebound and no guarding.  Musculoskeletal: Normal range of motion. He exhibits no edema or tenderness.  Neurological: He is alert and oriented to person, place, and time. He has normal reflexes. He displays normal reflexes. No cranial nerve deficit. He exhibits normal muscle tone. Coordination normal.  Skin: Skin is warm and dry. No rash noted. No erythema. No pallor.  Slight splinter hemorrhage, negative janeway, negative osler.    Psychiatric: He has a normal mood and affect. His behavior is normal. Judgment and thought content normal.     Assessment/Plan Fever  ? Uti, vs abscess  Check esr, crp blood culture x2 Start on vanco, levaquin for uti Check cardiac echo  Leukocytosis secondary to uti  Abscess vs hematoma Appreciate surgical input   Pafib:   Cont aspirin for now  DVT prophylaxis: scd due to ? Hematoma, DVT lovenox for now since hx of DVT  Jani Gravel 08/26/2014, 7:52 PM

## 2014-08-26 NOTE — ED Notes (Signed)
Pt  Upset with delay. Was told he was here for admission under general surgery.  Notified Dr. Johney Maine.  Per Dr. Johney Maine, pt is to come through ED department and our MD is to notify them when needed.  Not aware of pt being direct admit under general surgery.  Notified family that pt will need to wait in lobby while labs being drawn for now.  May be able to open hall bed in order to get pt back sooner.  Vitals stable at this time.  Labs ordered.

## 2014-08-26 NOTE — ED Provider Notes (Signed)
CSN: [id number]     Arrival date & time 08/26/14  1747 History   First MD Initiated Contact with Patient 08/26/14 1852     Chief Complaint  Patient presents with  . Pyelonephritis  . Abdominal Pain     (Consider location/radiation/quality/duration/timing/severity/associated sxs/prior Treatment) Patient is a 54 y.o. male presenting with abdominal pain and fever.  Abdominal Pain Associated symptoms: fever and nausea   Associated symptoms: no chest pain, no cough, no diarrhea (chronic loose, hx colectomy), no dysuria, no shortness of breath, no sore throat and no vomiting   Fever Max temp prior to arrival:  103.5 Severity:  Severe Onset quality:  Gradual Duration:  4 days Timing:  Constant Progression:  Worsening Chronicity:  New Associated symptoms: nausea   Associated symptoms: no chest pain, no cough, no diarrhea (chronic loose, hx colectomy), no dysuria, no ear pain, no headaches, no myalgias, no rash, no sore throat and no vomiting   Associated symptoms comment:  Hematuria    Past Medical History  Diagnosis Date  . Chronic diarrhea   . Rectal bleed   . Hemorrhoids   . Ulcerative colitis followed by dr Laural Golden    Distal UC dx 2004  . History of stroke March 2014 --  residual right weaker than prior to cva, unable to make fist    Bilateral cerebral and right cerebellar infarcts - felt to be embolic vs hypotensive (in setting of septic shock, transient a-fib, ARDS)  . History of DVT (deep vein thrombosis)     2014--  right upper extremity  . 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 History of acute renal failure     post-op  total colectomy 02-27-2012--  required dialysis--  resolved  . History of ARDS     02-27-2012  post op total colectomy -- septic shock-  required intubation/ventilator for 3-4 wks  . History of pleural effusion     acute- bilateral in  setting septic shock post op Feb 2014  . History of Clostridium difficile colitis     02-27-2012--  fulminant C.Diff colitis caused toxic megacolon--  s/p  total colectomy  . Renal calculi     bilateral--  left > right  . History of atrial fibrillation     02/ 2014--  no issues since  . Urgency of urination   . HAP (hospital-acquired pneumonia) 04/21/2012  . Clostridium difficile colitis 04/26/2012  . Aspiration pneumonitis 04/26/2012  . Allergic rhinitis 06/28/2012  . Ascites s/p US guide paracentesis (4/2) 04/25/2012   Past Surgical History  Procedure Laterality Date  . Colonoscopy  02/16/2011    Procedure: COLONOSCOPY;  Surgeon: Rogene Houston, MD;  [facility];  Service: Endoscopy;  Laterality: N/A;  100  . Flexible sigmoidoscopy  02/27/2012    Procedure: FLEXIBLE SIGMOIDOSCOPY;  Surgeon: Rogene Houston, MD;  [facility];  Service: Endoscopy;  Laterality: N/A;  with colonic decompression  . Laparotomy  02/27/2012    Procedure: EXPLORATORY LAPAROTOMY;  Surgeon: Donato Heinz, MD;  [facility];  Service: General;  Laterality: N/A;  . Cecostomy  02/27/2012    Procedure: CECOSTOMY;  Surgeon: Donato Heinz, MD;  [facility];  Service: General;  Laterality: N/A;  Cecostomy Tube Placement  . Colectomy  02/28/2012    Procedure: TOTAL COLECTOMY;  Surgeon: Donato Heinz, MD;  [facility];  Service: General;  Laterality: N/A;  . Ileostomy  02/28/2012    Procedure: ILEOSTOMY;  Surgeon: Donato Heinz, MD;  [facility];  Service: General;  Laterality: N/A;  . Cystoscopy w/ ureteral stent placement Bilateral 03/20/2012    Procedure: CYSTOSCOPY WITH RETROGRADE PYELOGRAM/;  Surgeon: Alexis Frock, MD;  Location: [place];  Service: Urology;  Laterality: Bilateral. Pt reports no stents were placed.  . Laparotomy N/A 03/26/2012    Procedure: EXPLORATORY LAPAROTOMY;  Surgeon: Edward Jolly, MD;  Location: [place];  Service: General;  Laterality: N/A;  . Application of  wound vac N/A 03/26/2012    Procedure: APPLICATION OF WOUND VAC;  Surgeon: Edward Jolly, MD;  [facility];  Service: General;  Laterality: N/A;  . Liver biopsy N/A 03/26/2012    Procedure: LIVER BIOPSY;  Surgeon: Edward Jolly, MD;  Location: [place];  Service: General;  Laterality: N/A;  . Laparotomy N/A 03/29/2012    Procedure: EXPLORATORY LAPAROTOMY, PARTIAL WOUND CLOSURE;  Surgeon: Edward Jolly, MD;  Location: [place];  Service: General;  Laterality: N/A;  . Vacuum assisted closure change N/A 03/29/2012    Procedure: Open ABDOMINAL VACUUM  CHANGE;  Surgeon: Edward Jolly, MD;  Location: [place];  Service: General;  Laterality: N/A;  . Vacuum assisted closure change N/A 04/01/2012    Procedure: removal of abdominal vac dressing and abdominal closure;  Surgeon: Edward Jolly, MD;  Location: [place];  Service: General;  Laterality: N/A;  . Flexible sigmoidoscopy N/A 02/06/2013    Procedure: FLEXIBLE SIGMOIDOSCOPY;  Surgeon: Rogene Houston, MD;  [facility];  Service: Endoscopy;  Laterality: N/A;  100-moved to 1200 Ann notified pt  . Flexible sigmoidoscopy N/A 03/27/2013    Procedure: FLEXIBLE SIGMOIDOSCOPY;  Surgeon: Rogene Houston, MD;  [facility];  Service: Endoscopy;  Laterality: N/A;  225  . Ileostomy closure N/A 05/27/2013    Procedure: ILEOSTOMY TAKEDOWN WITH ILEOPROCTOSTOMY;  Surgeon: Edward Jolly, MD;  [facility];  Service: General;  Laterality: N/A;  . Flexible sigmoidoscopy N/A 12/11/2013    Procedure: FLEXIBLE SIGMOIDOSCOPY;  Surgeon: Rogene Houston, MD;  [facility];  Service: Endoscopy;  Laterality: N/A;  200  . Cystoscopy with retrograde pyelogram, ureteroscopy and stent placement Left 07/16/2014    Procedure: CYSTOSCOPY WITH RETROGRADE PYELOGRAM,  AND LEFT STENT PLACEMENT;  Surgeon: Alexis Frock, MD;  [facility];  Service: Urology;  Laterality: Left;  . Sigmoid colectomy /  low anterior resection/  end  sigmoid colostomy  11-07-2010   [facility]    Sigmoid colonic perferation---  TAKE DOWN COLOSTOMY,  Jan 2013  . Transthoracic echocardiogram  03-26-2012  (in setting of septic shock)    report states  poorly visualized--  normal LVSF, ef 50-55% and no pericardial effusion  . Cystoscopy with retrograde pyelogram, ureteroscopy and stent placement Bilateral 07/25/2014    Procedure: CYSTOSCOPY WITH RETROGRADE PYELOGRAM, URETEROSCOPY AND BILATERAL STENT PLACEMENT;  Surgeon: Alexis Frock, MD;  Location: [facility];  Service: Urology;  Laterality: Bilateral;  . Holmium laser application Left 01/29/6061    Procedure: HOLMIUM LASER APPLICATION, LEFT ONLY ;  Surgeon: Alexis Frock, MD;  Location: [facility];  Service: Urology;  Laterality: Left;   Family History  Problem Relation Age of Onset  . Cancer Sister   . Healthy Daughter   . Hypertension Mother   . Colon cancer Neg Hx  History  Substance Use Topics  . Smoking status: Former Smoker -- 0.50 packs/day for 12 years    Types: Cigarettes    Quit date: 07/17/1984  . Smokeless tobacco: Never Used  . Alcohol Use: No    Review of Systems  Constitutional: Positive for fever.  HENT: Negative for ear pain and sore throat.   Eyes: Negative for visual disturbance.  Respiratory: Negative for cough and shortness of breath.   Cardiovascular: Negative for chest pain.  Gastrointestinal: Positive for nausea and abdominal pain. Negative for vomiting and diarrhea (chronic loose, hx colectomy).  Genitourinary: Negative for dysuria and difficulty urinating.  Musculoskeletal: Negative for myalgias, back pain and neck stiffness.  Skin: Negative for rash.  Neurological: Negative for syncope and headaches.      Allergies  Penicillins; Clindamycin/lincomycin; Morphine and related; Claritin; and Primaxin  Home Medications   Prior to Admission medications   Medication Sig Start Date End Date Taking? Authorizing Provider   aspirin EC 81 MG tablet Take 81 mg by mouth daily.    Historical Provider, MD  cholestyramine Lucrezia Starch) 4 G packet Take 1 packet (4 g total) by mouth 2 (two) times daily. Patient not taking: Reported on 07/16/2014 06/05/14   Rogene Houston, MD  ciprofloxacin (CIPRO) 500 MG tablet Take 1 tablet (500 mg total) by mouth 2 (two) times daily. X 3 days. Begin day prior to next Urology appointment. 07/25/14   Alexis Frock, MD  diphenoxylate-atropine (LOMOTIL) 2.5-0.025 MG per tablet Take 1 tablet by mouth 4 (four) times daily. Patient taking differently: Take 1 tablet by mouth 3 (three) times daily.  05/12/14   Rogene Houston, MD  mesalamine (CANASA) 1000 MG suppository Place 1 suppository (1,000 mg total) rectally at bedtime. 05/19/14   Rogene Houston, MD  mesalamine (PENTASA) 500 MG CR capsule Take 2 capsules (1,000 mg total) by mouth 2 (two) times daily. Patient takes 2 by mouth twice daily 03/18/14   Rogene Houston, MD  Multiple Vitamin (MULTIVITAMIN) LIQD Take 5 mLs by mouth daily. 06/06/12   Bary Leriche, PA-C  oxyCODONE-acetaminophen (PERCOCET/ROXICET) 5-325 MG per tablet Take 1 tablet by mouth every 4 (four) hours as needed for moderate pain or severe pain. 07/25/14   Alexis Frock, MD  promethazine (PHENERGAN) 12.5 MG tablet Take 1 tablet (12.5 mg total) by mouth every 6 (six) hours as needed for nausea or vomiting. 07/25/14   Alexis Frock, MD  saccharomyces boulardii (FLORASTOR) 250 MG capsule Take 250 mg by mouth 2 (two) times daily.     Historical Provider, MD   BP 116/96 mmHg  Pulse 92  Temp(Src) 98.9 F (37.2 C) (Oral)  Resp 20  Wt 157 lb (71.215 kg)  SpO2 98% Physical Exam  Constitutional: He is oriented to person, place, and time. He appears well-developed and well-nourished. No distress.  HENT:  Head: Normocephalic and atraumatic.  Eyes: Conjunctivae and EOM are normal.  Neck: Normal range of motion.  Cardiovascular: Normal rate, regular rhythm, normal heart sounds and intact  distal pulses.  Exam reveals no gallop and no friction rub.   No murmur heard. Pulmonary/Chest: Effort normal and breath sounds normal. No respiratory distress. He has no wheezes. He has no rales.  Abdominal: Soft. He exhibits no distension. There is tenderness (luq). There is no guarding.  Musculoskeletal: He exhibits no edema.  Neurological: He is alert and oriented to person, place, and time.  Skin: Skin is warm and dry. He is not diaphoretic.  Nursing note and  vitals reviewed.   ED Course  Procedures (including critical care time) Labs Review Labs Reviewed  CBC WITH DIFFERENTIAL/PLATELET - Abnormal; Notable for the following:    WBC 19.2 (*)    RBC 3.68 (*)    Hemoglobin 10.8 (*)    HCT 32.4 (*)    Neutrophils Relative % 78 (*)    Neutro Abs 14.8 (*)    Monocytes Absolute 1.8 (*)    All other components within normal limits  CULTURE, BLOOD (ROUTINE X 2)  CULTURE, BLOOD (ROUTINE X 2)  COMPREHENSIVE METABOLIC PANEL  URINALYSIS, ROUTINE W REFLEX MICROSCOPIC (NOT AT [facility])  PROTIME-INR  LIPASE, BLOOD  AMYLASE    Imaging Review No results found.   EKG Interpretation None      MDM   Final diagnoses:  Renal calculi  Ulcerative colitis, without complications   14XW male with history of ulcerative colitis, exploratory laparotomy/colectomy in 2014, at which time he had RUE DVT (no longer on anticoagulation), atrial fibrillation, with history of CVA, CKD, recent nephrolithiasis requiring lithotripsy and bilateral stents (removed 7/15) presents from PCP office after presenting with fever and having CT abdomen pelvis concerning for abscess.  Pt does not have cough, UA and symptoms not consistent with UTI, although gram stain indicates some bacteria and will await culture. Blood cx pending. Given no other clear source of fever and with patient having pain over the area concerning for abscess deep abd wall/transabdominal muscle would consider this source.  Consulted surgery, Dr.  Johney Maine, who came to see the patient in the Emergency Department and discussed with Hospitalist, Dr. Maudie Mercury who will admit the patient to continue IV abx, work up, consider IR drainage. Pt given vancomycin and rocephin. Pt hemodynamically stable.      Gareth Morgan, MD 08/27/14 1210

## 2014-08-26 NOTE — ED Notes (Signed)
Rec'd paperwork from [facility] regarding pt.

## 2014-08-26 NOTE — ED Notes (Signed)
Pt had stones removed in June and stents removed in July.  Pt has had fever since last Friday.  Notified MD and CT ordered.  Today results shows kidney infection.  Pt took tylenol at 12:30.  Has had high fevers at home.  MD told pt to come here.

## 2014-08-27 ENCOUNTER — Inpatient Hospital Stay (HOSPITAL_COMMUNITY): Payer: BLUE CROSS/BLUE SHIELD

## 2014-08-27 DIAGNOSIS — L0291 Cutaneous abscess, unspecified: Secondary | ICD-10-CM | POA: Insufficient documentation

## 2014-08-27 DIAGNOSIS — R509 Fever, unspecified: Secondary | ICD-10-CM

## 2014-08-27 DIAGNOSIS — D72829 Elevated white blood cell count, unspecified: Secondary | ICD-10-CM

## 2014-08-27 DIAGNOSIS — N390 Urinary tract infection, site not specified: Secondary | ICD-10-CM

## 2014-08-27 DIAGNOSIS — N200 Calculus of kidney: Secondary | ICD-10-CM

## 2014-08-27 LAB — CBC
HCT: 29 % — ABNORMAL LOW (ref 39.0–52.0)
Hemoglobin: 9.6 g/dL — ABNORMAL LOW (ref 13.0–17.0)
MCH: 29.5 pg (ref 26.0–34.0)
MCHC: 33.1 g/dL (ref 30.0–36.0)
MCV: 89.2 fL (ref 78.0–100.0)
Platelets: 248 10*3/uL (ref 150–400)
RBC: 3.25 MIL/uL — AB (ref 4.22–5.81)
RDW: 14.1 % (ref 11.5–15.5)
WBC: 12.6 10*3/uL — ABNORMAL HIGH (ref 4.0–10.5)

## 2014-08-27 LAB — COMPREHENSIVE METABOLIC PANEL
ALT: 11 U/L — AB (ref 17–63)
AST: 13 U/L — AB (ref 15–41)
Albumin: 2.8 g/dL — ABNORMAL LOW (ref 3.5–5.0)
Alkaline Phosphatase: 59 U/L (ref 38–126)
Anion gap: 6 (ref 5–15)
BUN: 15 mg/dL (ref 6–20)
CHLORIDE: 105 mmol/L (ref 101–111)
CO2: 24 mmol/L (ref 22–32)
Calcium: 8.2 mg/dL — ABNORMAL LOW (ref 8.9–10.3)
Creatinine, Ser: 1.39 mg/dL — ABNORMAL HIGH (ref 0.61–1.24)
GFR, EST NON AFRICAN AMERICAN: 55 mL/min — AB (ref >60)
GLUCOSE: 105 mg/dL — AB (ref 65–99)
Potassium: 3.9 mmol/L (ref 3.5–5.1)
Sodium: 135 mmol/L (ref 135–145)
Total Bilirubin: 0.7 mg/dL (ref 0.3–1.2)
Total Protein: 6.4 g/dL — ABNORMAL LOW (ref 6.5–8.1)

## 2014-08-27 MED ORDER — MIDAZOLAM HCL 2 MG/2ML IJ SOLN
INTRAMUSCULAR | Status: AC
  Filled 2014-08-27: qty 4

## 2014-08-27 MED ORDER — MIDAZOLAM HCL 2 MG/2ML IJ SOLN
INTRAMUSCULAR | Status: AC | PRN
  Administered 2014-08-27 (×3): 0.5 mg via INTRAVENOUS
  Administered 2014-08-27: 1 mg via INTRAVENOUS

## 2014-08-27 MED ORDER — FENTANYL CITRATE (PF) 100 MCG/2ML IJ SOLN
INTRAMUSCULAR | Status: AC | PRN
  Administered 2014-08-27 (×4): 25 ug via INTRAVENOUS

## 2014-08-27 MED ORDER — FENTANYL CITRATE (PF) 100 MCG/2ML IJ SOLN
INTRAMUSCULAR | Status: AC
  Filled 2014-08-27: qty 2

## 2014-08-27 NOTE — Progress Notes (Signed)
Patient ID: Connor Small, male   DOB: 1960-11-27, 53 y.o.   MRN: [id number]    Referring Physician(s): [facility]  Chief Complaint:  Left lateral abdominal pain  Subjective:  Patient familiar to IR service from remote gastrostomy tube as well as thoracenteses 979. 54 year old white male with past medical history significant for UC with stricture as well as C. difficile colitis, complicated by toxic megacolon, status post colectomy 2/24/ 2014, ileostomy takedown 05/2013 as well as recent urologic stenting and lithotripsy. Patient presented to the hospital on 08/26/14 with complaints of fever and left lateral abdominal/flank pain. Outside CT performed at [facility] revealed resolution of left-sided hydronephrosis / renal calculi, complex left renal cyst, nonobstructive right renal calculus, as well as increasing thickness of the left transversalis abdominal muscle suggesting possible abscess versus hematoma. Request has now been received for CT guided aspiration/possible drainage of the left abdominal wall collection. Patient denies fever today, as well as chest pain, dyspnea, nausea, vomiting, abnormal bleeding. He does continue to have some mild left lateral/LLQ abdominal/flank discomfort.  Allergies: Penicillins; Clindamycin/lincomycin; Morphine and related; Claritin; and Primaxin  Medications: Prior to Admission medications   Medication Sig Start Date End Date Taking? Authorizing Provider  acetaminophen (TYLENOL) 500 MG tablet Take 1,000 mg by mouth every 6 (six) hours as needed for fever.   Yes Historical Provider, MD  aspirin EC 81 MG tablet Take 81 mg by mouth daily.   Yes Historical Provider, MD  ciprofloxacin (CIPRO) 500 MG tablet Take 1 tablet (500 mg total) by mouth 2 (two) times daily. X 3 days. Begin day prior to next Urology appointment. 07/25/14  Yes Sebastian Ache, MD  ciprofloxacin (CIPRO) 500 MG tablet Take 500 mg by mouth 2 (two) times daily.   Yes Historical Provider, MD    diphenoxylate-atropine (LOMOTIL) 2.5-0.025 MG per tablet Take 1 tablet by mouth 4 (four) times daily. Patient taking differently: Take 1 tablet by mouth 3 (three) times daily.  05/12/14  Yes Malissa Hippo, MD  mesalamine (CANASA) 1000 MG suppository Place 1 suppository (1,000 mg total) rectally at bedtime. 05/19/14  Yes Malissa Hippo, MD  mesalamine (PENTASA) 500 MG CR capsule Take 2 capsules (1,000 mg total) by mouth 2 (two) times daily. Patient takes 2 by mouth twice daily 03/18/14  Yes Malissa Hippo, MD  oxyCODONE-acetaminophen (PERCOCET/ROXICET) 5-325 MG per tablet Take 1 tablet by mouth every 4 (four) hours as needed for moderate pain or severe pain. 07/25/14  Yes Sebastian Ache, MD  promethazine (PHENERGAN) 12.5 MG tablet Take 1 tablet (12.5 mg total) by mouth every 6 (six) hours as needed for nausea or vomiting. 07/25/14  Yes Sebastian Ache, MD  saccharomyces boulardii (FLORASTOR) 250 MG capsule Take 250 mg by mouth 2 (two) times daily.    Yes Historical Provider, MD     Vital Signs: BP 104/57 mmHg  Pulse 85  Temp(Src) 98.5 F (36.9 C) (Oral)  Resp 20  Ht 5\' 10"  (1.778 m)  Wt 154 lb 15.7 oz (70.3 kg)  BMI 22.24 kg/m2  SpO2 99%  Physical Exam patient awake, alert. Chest-clear to auscultation bilaterally; heart -regular rate and rhythm; abdomen soft, positive bowel sounds, clean surgical scars, mild left lateral abdomen/LLQ//flank discomfort ;extremities full range of motion and no edema  Imaging: X-ray Chest Pa And Lateral  08/27/2014   CLINICAL DATA:  Acute onset of fever. Preoperative chest radiograph for abdominal surgery. Initial encounter.  EXAM: CHEST  2 VIEW  COMPARISON:  Chest radiograph performed 07/18/2014  FINDINGS: The lungs are well-aerated. Chronic left-sided pleural parenchymal scarring is unchanged in appearance. There is no evidence of focal opacification, pleural effusion or pneumothorax.  The heart is normal in size; the mediastinal contour is within normal limits.  No acute osseous abnormalities are seen.  IMPRESSION: No acute cardiopulmonary process seen. Chronic left-sided pleural parenchymal scarring is unchanged in appearance.   Electronically Signed   By: Roanna Raider M.D.   On: 08/27/2014 02:21    Labs:  CBC:  Recent Labs  07/18/14 0501 07/19/14 0455 08/26/14 1849 08/27/14 0455  WBC 6.7 6.8 19.2* 12.6*  HGB 11.4* 10.6* 10.8* 9.6*  HCT 34.9* 31.6* 32.4* 29.0*  PLT 54* 49* 304 248    COAGS:  Recent Labs  08/26/14 1821  INR 1.13    BMP:  Recent Labs  07/19/14 0455 07/20/14 0514 08/26/14 1849 08/27/14 0455  NA 132* 135 133* 135  K 4.7 4.0 3.8 3.9  CL 104 107 100* 105  CO2 22 21* 23 24  GLUCOSE 103* 105* 108* 105*  BUN 21* 20 17 15   CALCIUM 7.4* 7.7* 8.3* 8.2*  CREATININE 2.05* 1.89* 1.48* 1.39*  GFRNONAA 35* 38* 51* 55*  GFRAA 40* 44* 59* >60    LIVER FUNCTION TESTS:  Recent Labs  06/02/14 1517 08/26/14 1849 08/27/14 0455  BILITOT 0.2 0.7 0.7  AST 13 17 13*  ALT 11 13* 11*  ALKPHOS 69 78 59  PROT 5.7* 7.5 6.4*  ALBUMIN 3.3* 3.4* 2.8*    Assessment and Plan: Patient with history of ulcerative colitis, cecostomy and then total colectomy February 2014, followed by 4 more surgeries including ileostomy with takedown and ileoproctostomy on 05/2013. Also with bilateral nephrolithiasis/UTI and recent cystoscopy with bilateral ureteral stent placement 07/25/14. Presents now with abdominal pain, fever, outside CT revealing a thickened left transversalis abdominal muscle/fluid collection-hematoma versus abscess. Request received for CT guided aspiration/possible drainage of collection. Imaging studies were reviewed by Dr. Archer Asa. Plan is for procedure later today. Labs reviewed-WBC today 12.6, hemoglobin 9.6, creatinine slightly elevated at 1.39, PT/INR normal. Details/risks of procedure, including but not limited to, internal bleeding, infection, injury to adjacent structures, inability to drain collection discussed  with patient and wife with their understanding and consent.   Signed: D. Jeananne Rama 08/27/2014, 1:31 PM   I spent a total of 25 minutes in face to face in clinical consultation/evaluation, greater than 50% of which was counseling/coordinating care for CT guided asp/possible drainage of left lateral abd fluid collection

## 2014-08-27 NOTE — Progress Notes (Signed)
Initial Nutrition Assessment  DOCUMENTATION CODES:   Not applicable  INTERVENTION:  - RD will continue to monitor for needs, especially with diet advancement  NUTRITION DIAGNOSIS:   Inadequate oral intake related to inability to eat as evidenced by NPO status.  GOAL:   Patient will meet greater than or equal to 90% of their needs  MONITOR:   Diet advancement, Weight trends, Labs, I & O's  REASON FOR ASSESSMENT:   Malnutrition Screening Tool  ASSESSMENT:   Patient with h/o ulcerative colitis with stricture & C Diff colitis progressing to toxic megacolon. Refractory to cecostomy & nneded abdominal colectomy 02/28/2012. Transferred to [facility] with washouts & closure. Ileostomy takedown 05/27/2013 by Dr Excell Seltzer. Small suture removed from incision Jan 2016. No abd events. Massive kidney stones with some hydronephrosis noted. Began again deconditioned. Underwent stenting and lithotripsy. Eventually improved.Stents removed a few weeks ago.Denies any hematuria or dysuria at this time.   Pt seen for MST. BMI indicates normal weight status. Pt NPO pending surgery. He states that he has not had anything since noon yesterday and is asking about diet advancement. Informed pt that this would be ordered by surgical team and he may start with liquids before advancing to solid foods. Pt states "I don't think I can wait that long."  He states that he has a good appetite at baseline and eats 3 meals a day and sometimes eats a snack between lunch and dinner. He was not drinking nutrition supplements at home. He indicates that he had been gaining weight but that he has lost 4-5 lbs in the past 2 weeks due to not being able to eat due to feeling nauseous and fever-ish. Per chart review, he has lost 6% body weight in the past 2 months which is significant for time frame. No muscle or fat wasting noted at this time.  Unable to meet needs. Medications reviewed. Labs reviewed; creatinine elevated but  trending down, Ca: 8.2 mg/dL, GFR: 55.   Diet Order:  Diet NPO time specified Except for: Sips with Meds  Skin:  Reviewed, no issues  Last BM:  PTA  Height:   Ht Readings from Last 1 Encounters:  08/26/14 5' 10"  (1.778 m)    Weight:   Wt Readings from Last 1 Encounters:  08/27/14 154 lb 15.7 oz (70.3 kg)    Ideal Body Weight:  75.45 kg (kg)  BMI:  Body mass index is 22.24 kg/(m^2).  Estimated Nutritional Needs:   Kcal:  3818-4037  Protein:  70-85 grams  Fluid:  2.5 L/day  EDUCATION NEEDS:   No education needs identified at this time     Jarome Matin, RD, LDN Inpatient Clinical Dietitian Pager # [phone] After hours/weekend pager # [phone]

## 2014-08-27 NOTE — Procedures (Signed)
Interventional Radiology Procedure Note  Procedure: Placement of 78F drain into the LLQ anterior abdominal wall collection.  Aspiration yields 80 mL pearlescent bloody fluid.  Clinically, this looks like superinfected hematoma.  Drain left to JP bulb.   Complications: None  Estimated Blood Loss: 0  Recommendations: - Drain to JP bulb suction - Cx are sent - Flush BID-TID  Signed,  Criselda Peaches, MD

## 2014-08-27 NOTE — Progress Notes (Signed)
Subjective: He feels better, no fever since last PM responding well to antibiotics, Dr. Maudie Mercury has requested evaluation from IR to drain site in left abdomen.  Pt says it is sore, and physical exam is not consistent with an abdominal wall abscess.  Objective: Vital signs in last 24 hours: Temp:  [98.5 F (36.9 C)-103.1 F (39.5 C)] 98.5 F (36.9 C) (08/03 0434) Pulse Rate:  [88-98] 88 (08/03 0434) Resp:  [16-20] 16 (08/03 0434) BP: (101-116)/(57-96) 101/59 mmHg (08/03 0434) SpO2:  [93 %-99 %] 93 % (08/03 0434) Weight:  [70.3 kg (154 lb 15.7 oz)-71.215 kg (157 lb)] 70.3 kg (154 lb 15.7 oz) (08/03 0434) Last BM Date: 08/26/14 NO I/O yet Tm 102.7 at 1900 last PM, afebrile since that time WBC is down from 19 to 12K Intake/Output from previous day:   Intake/Output this shift:    General appearance: alert, cooperative and no distress GI: soft, he is a little tender left abdominal wall.  No erythema, distension, or pain associated with abscess.  Lab Results:   Recent Labs  08/26/14 1849 08/27/14 0455  WBC 19.2* 12.6*  HGB 10.8* 9.6*  HCT 32.4* 29.0*  PLT 304 248    BMET  Recent Labs  08/26/14 1849 08/27/14 0455  NA 133* 135  K 3.8 3.9  CL 100* 105  CO2 23 24  GLUCOSE 108* 105*  BUN 17 15  CREATININE 1.48* 1.39*  CALCIUM 8.3* 8.2*   PT/INR  Recent Labs  08/26/14 1821  LABPROT 14.7  INR 1.13     Recent Labs Lab 08/26/14 1849 08/27/14 0455  AST 17 13*  ALT 13* 11*  ALKPHOS 78 59  BILITOT 0.7 0.7  PROT 7.5 6.4*  ALBUMIN 3.4* 2.8*     Lipase     Component Value Date/Time   LIPASE 20* 08/26/2014 1821     Studies/Results: X-ray Chest Pa And Lateral  08/27/2014   CLINICAL DATA:  Acute onset of fever. Preoperative chest radiograph for abdominal surgery. Initial encounter.  EXAM: CHEST  2 VIEW  COMPARISON:  Chest radiograph performed 07/18/2014  FINDINGS: The lungs are well-aerated. Chronic left-sided pleural parenchymal scarring is unchanged in  appearance. There is no evidence of focal opacification, pleural effusion or pneumothorax.  The heart is normal in size; the mediastinal contour is within normal limits. No acute osseous abnormalities are seen.  IMPRESSION: No acute cardiopulmonary process seen. Chronic left-sided pleural parenchymal scarring is unchanged in appearance.   Electronically Signed   By: Garald Balding M.D.   On: 08/27/2014 02:21    Medications: . aspirin EC  81 mg Oral Daily  . diphenoxylate-atropine  1 tablet Oral TID  . enoxaparin (LOVENOX) injection  40 mg Subcutaneous QHS  . levofloxacin (LEVAQUIN) IV  750 mg Intravenous Q24H  . mesalamine  1,000 mg Rectal QHS  . mesalamine  1,000 mg Oral BID  . multivitamin  5 mL Oral Daily  . pantoprazole (PROTONIX) IV  40 mg Intravenous Q12H  . saccharomyces boulardii  250 mg Oral BID  . sodium chloride  3 mL Intravenous Q12H  . vancomycin  750 mg Intravenous Q12H    Assessment/Plan Abdominal pain, and fever with abnormal CT:  abdomen with thickened left transversalis Abscess vs hematoma Recent large left renal stone/UTI, with CYSTOSCOPY WITH RETROGRADE PYELOGRAM, URETEROSCOPY AND BILATERAL STENT PLACEMENT (Bilateral) HOLMIUM LASER APPLICATION, LEFT ONLY, 07/25/14, Dr. Alexis Frock Right renal stone- nonobstructive UTI  11-20 WBC/HPF Blood cultures pending Hx of ulcerative colitis, cecostomy and then total  colectomy 02/2012, followed by 4 more surgeries after; includingILEOSTOMY TAKEDOWN WITH ILEOPROCTOSTOMY, 05/27/13, Dr. Excell Seltzer Mild renal insuffiencey, creatinine 1.48 on admit  Antibiotics:  Day 1 of Levofloxacin/Vancomycin DVT:  SCD's   Plan:  IR is looking for the study to determine if he can have some type of aspiration.  From our standpoint he looks pretty good after starting antibiotics last PM.  Will follow with you.   LOS: 1 day    Theressa Piedra 08/27/2014

## 2014-08-27 NOTE — Progress Notes (Signed)
[facility] PROGRESS NOTE  Connor Small VFI:[id number] DOB: 1960/04/09 DOA: 08/26/2014 PCP: Eartha Inch, MD  Assessment/Plan: 1. Left flank abdominal pain -Patient having a CT scan of abdomen and pelvis performed at outside facility that showed thickened left transversalis could be consistent with abscess versus hematoma. He presented with a fever of 103. -This unlikely to be a complication from abdominal surgeries in 2014. Also doubtful this would be from urologic procedures. -On exam area appeared clean, no evidence of infection -Gen. surgery recommending aspiration  2.  Urinary tract infection. -Suspect complication of recent urologic procedure as he underwent stenting and lithotripsy. -Urinalysis showing the presence of many bacteria -It is possible this may be cause of fever. -Pending urine cultures -White count trended down from 19,200-12,600 on lab work -Continue current antimicrobial regimen for now  3.  History of paroxysmal atrial fibrillation -Currently remains in normal sinus rhythm  4.  History of ulcerative colitis -Does not appear to have acute issues, continue mesalamine  5.  Acute kidney injury. -Likely secondary to hypovolemia/prerenal azotemia -His creatinine trended down from 2.05-1.39 on a.m. lab work  Code Status: Full code Family Communication: I spoke to his wife was present at bedside Disposition Plan:    Consultants:  General surgery   HPI/Subjective: Patient is a pleasant 54 year old gentleman with a past medical history of ulcerative colitis with stricture, history of C. difficile colitis complicated by toxic megacolon, undergoing colectomy on 03/19/2012 status post ileostomy takedown 05/27/2013, undergoing recent urologic procedure that included stenting and lithotripsy, admitted to the medicine service on 08/26/2014 when he presented with complaints of fever associate with left flank pain. CT scan of abdomen and pelvis done at outside  facility showed possibility of abscess versus hematoma. General surgery was consulted. He was started on empiric IV antibiotic therapy with Levaquin and vancomycin. General surgery recommending aspiration. Patient otherwise showing significant improvement with IV antibiotic therapy.  Objective: Filed Vitals:   08/27/14 0434  BP: 101/59  Pulse: 88  Temp: 98.5 F (36.9 C)  Resp: 16   No intake or output data in the 24 hours ending 08/27/14 1156 Filed Weights   08/26/14 1751 08/27/14 0434  Weight: 71.215 kg (157 lb) 70.3 kg (154 lb 15.7 oz)    Exam:   General:  Patient is nontoxic appearing, awake and alert oriented 3  Cardiovascular: Regular rate and rhythm normal S1-S2 no murmurs rubs or gallops  Respiratory: Normal respiratory effort, lungs are clear to auscultation bilaterally  Abdomen: Abdominal exam is benign, he has old surgical incision sites over abdomen with no evidence of acute infection, overall his abdomen was soft nontender nondistended  Musculoskeletal: No edema  Data Reviewed: Basic Metabolic Panel:  Recent Labs Lab 08/26/14 1849 08/27/14 0455  NA 133* 135  K 3.8 3.9  CL 100* 105  CO2 23 24  GLUCOSE 108* 105*  BUN 17 15  CREATININE 1.48* 1.39*  CALCIUM 8.3* 8.2*   Liver Function Tests:  Recent Labs Lab 08/26/14 1849 08/27/14 0455  AST 17 13*  ALT 13* 11*  ALKPHOS 78 59  BILITOT 0.7 0.7  PROT 7.5 6.4*  ALBUMIN 3.4* 2.8*    Recent Labs Lab 08/26/14 1821  LIPASE 20*  AMYLASE 54   No results for input(s): AMMONIA in the last 168 hours. CBC:  Recent Labs Lab 08/26/14 1849 08/27/14 0455  WBC 19.2* 12.6*  NEUTROABS 14.8*  --   HGB 10.8* 9.6*  HCT 32.4* 29.0*  MCV 88.0 89.2  PLT 304 248  Cardiac Enzymes: No results for input(s): CKTOTAL, CKMB, CKMBINDEX, TROPONINI in the last 168 hours. BNP (last 3 results) No results for input(s): BNP in the last 8760 hours.  ProBNP (last 3 results) No results for input(s): PROBNP in the  last 8760 hours.  CBG: No results for input(s): GLUCAP in the last 168 hours.  No results found for this or any previous visit (from the past 240 hour(s)).   Studies: X-ray Chest Pa And Lateral  08/27/2014   CLINICAL DATA:  Acute onset of fever. Preoperative chest radiograph for abdominal surgery. Initial encounter.  EXAM: CHEST  2 VIEW  COMPARISON:  Chest radiograph performed 07/18/2014  FINDINGS: The lungs are well-aerated. Chronic left-sided pleural parenchymal scarring is unchanged in appearance. There is no evidence of focal opacification, pleural effusion or pneumothorax.  The heart is normal in size; the mediastinal contour is within normal limits. No acute osseous abnormalities are seen.  IMPRESSION: No acute cardiopulmonary process seen. Chronic left-sided pleural parenchymal scarring is unchanged in appearance.   Electronically Signed   By: Roanna Raider M.D.   On: 08/27/2014 02:21    Scheduled Meds: . aspirin EC  81 mg Oral Daily  . diphenoxylate-atropine  1 tablet Oral TID  . enoxaparin (LOVENOX) injection  40 mg Subcutaneous QHS  . levofloxacin (LEVAQUIN) IV  750 mg Intravenous Q24H  . mesalamine  1,000 mg Rectal QHS  . mesalamine  1,000 mg Oral BID  . multivitamin  5 mL Oral Daily  . pantoprazole (PROTONIX) IV  40 mg Intravenous Q12H  . saccharomyces boulardii  250 mg Oral BID  . sodium chloride  3 mL Intravenous Q12H  . vancomycin  750 mg Intravenous Q12H   Continuous Infusions: . sodium chloride 75 mL/hr at 08/26/14 2311    Principal Problem:   UTI (lower urinary tract infection) Active Problems:   Leukocytosis   Renal calculi   Ulcerative colitis   Fever    Time spent: 35 minutes    Jeralyn Bennett  [facility] Pager [phone]. If 7PM-7AM, please contact night-coverage at [email or web address], password [facility] 08/27/2014, 11:56 AM  LOS: 1 day

## 2014-08-27 NOTE — Progress Notes (Signed)
*   Echocardiogram 2D Echocardiogram has been performed.  Tresa Res 08/27/2014, 12:42 PM

## 2014-08-28 DIAGNOSIS — K5190 Ulcerative colitis, unspecified, without complications: Secondary | ICD-10-CM

## 2014-08-28 DIAGNOSIS — R509 Fever, unspecified: Secondary | ICD-10-CM

## 2014-08-28 LAB — BASIC METABOLIC PANEL
ANION GAP: 7 (ref 5–15)
BUN: 19 mg/dL (ref 6–20)
CALCIUM: 8 mg/dL — AB (ref 8.9–10.3)
CHLORIDE: 104 mmol/L (ref 101–111)
CO2: 24 mmol/L (ref 22–32)
Creatinine, Ser: 1.31 mg/dL — ABNORMAL HIGH (ref 0.61–1.24)
GFR calc Af Amer: >60 mL/min (ref >60)
GFR calc non Af Amer: 59 mL/min — ABNORMAL LOW (ref >60)
Glucose, Bld: 94 mg/dL (ref 65–99)
Potassium: 4.3 mmol/L (ref 3.5–5.1)
Sodium: 135 mmol/L (ref 135–145)

## 2014-08-28 LAB — CBC
HCT: 28.7 % — ABNORMAL LOW (ref 39.0–52.0)
Hemoglobin: 9.5 g/dL — ABNORMAL LOW (ref 13.0–17.0)
MCH: 29.8 pg (ref 26.0–34.0)
MCHC: 33.1 g/dL (ref 30.0–36.0)
MCV: 90 fL (ref 78.0–100.0)
Platelets: 262 10*3/uL (ref 150–400)
RBC: 3.19 MIL/uL — ABNORMAL LOW (ref 4.22–5.81)
RDW: 14.1 % (ref 11.5–15.5)
WBC: 5.5 10*3/uL (ref 4.0–10.5)

## 2014-08-28 MED ORDER — PANTOPRAZOLE SODIUM 40 MG PO TBEC
40.0000 mg | DELAYED_RELEASE_TABLET | Freq: Two times a day (BID) | ORAL | Status: DC
  Administered 2014-08-28 – 2014-08-29 (×2): 40 mg via ORAL
  Filled 2014-08-28 (×2): qty 1

## 2014-08-28 NOTE — Progress Notes (Signed)
[facility] PROGRESS NOTE  Connor Small:[id number] DOB: 1960/09/08 DOA: 08/26/2014 PCP: Eartha Inch, MD  Assessment/Plan: 1. Left flank abdominal pain -Patient having a CT scan of abdomen and pelvis performed at outside facility that showed thickened left transversalis could be consistent with abscess versus hematoma. He presented with a fever of 103. -On 08/27/2014 patient undergoing placement of drain into the left lower quadrant abdominal wall, radiology reporting removing 80 mL of pearlescent bloody fluid -Cultures are pending at the time of this dictation -Patient remains on IV vancomycin and Levaquin  2.  Urinary tract infection. -Patient undergoing recent urologic procedure as he underwent stenting and lithotripsy. -Urinalysis showing the presence of many bacteria -Continue IV Levaquin   3.  History of paroxysmal atrial fibrillation -Currently remains in normal sinus rhythm  4.  History of ulcerative colitis -Does not appear to have acute issues, continue mesalamine  5.  Acute kidney injury. -Likely secondary to hypovolemia/prerenal azotemia -His creatinine trended down from 2.05-1.39 on a.m. 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 CT scan of abdomen and pelvis done at outside facility showed possibility of abscess versus hematoma. General  surgery was consulted. He was started on empiric IV antibiotic therapy with Levaquin and vancomycin. General surgery recommending aspiration. Patient otherwise showing significant improvement with IV antibiotic therapy.  Objective: Filed Vitals:   08/28/14 0513  BP: 108/61  Pulse: 80  Temp: 97.7 F (36.5 C)  Resp: 18    Intake/Output Summary (Last 24 hours) at 08/28/14 1237 Last data filed at 08/28/14 1032  Gross per 24 hour  Intake   1810 ml  Output     92 ml  Net   1718 ml   Filed Weights   08/26/14 1751 08/27/14 0434 08/28/14 0513  Weight: 71.215 kg (157 lb) 70.3 kg (154 lb 15.7 oz) 69.037 kg (152 lb 3.2 oz)    Exam:   General:  Patient is nontoxic appearing, awake and alert oriented 3  Cardiovascular: Regular rate and rhythm normal S1-S2 no murmurs rubs or gallops  Respiratory: Normal respiratory effort, lungs are clear to auscultation bilaterally  Abdomen: Abdominal exam is benign, he has old surgical incision sites over abdomen with no evidence of acute infection, overall his abdomen was soft nontender nondistended  Musculoskeletal: No edema  Data Reviewed: Basic Metabolic Panel:  Recent Labs Lab 08/26/14 1849 08/27/14 0455 08/28/14 1120  NA 133* 135 135  K 3.8 3.9 4.3  CL 100* 105 104  CO2 23 24 24   GLUCOSE 108* 105* 94  BUN 17 15 19   CREATININE 1.48* 1.39* 1.31*  CALCIUM 8.3* 8.2* 8.0*   Liver Function Tests:  Recent Labs Lab 08/26/14 1849 08/27/14 0455  AST 17 13*  ALT 13* 11*  ALKPHOS 78 59  BILITOT 0.7 0.7  PROT 7.5 6.4*  ALBUMIN 3.4* 2.8*    Recent Labs Lab 08/26/14 1821  LIPASE 20*  AMYLASE 54   No results for input(s): AMMONIA in the last 168  hours. CBC:  Recent Labs Lab 08/26/14 1849 08/27/14 0455 08/28/14 1120  WBC 19.2* 12.6* 5.5  NEUTROABS 14.8*  --   --   HGB 10.8* 9.6* 9.5*  HCT 32.4* 29.0* 28.7*  MCV 88.0 89.2 90.0  PLT 304 248 262   Cardiac Enzymes: No results for input(s): CKTOTAL, CKMB, CKMBINDEX,  TROPONINI in the last 168 hours. BNP (last 3 results) No results for input(s): BNP in the last 8760 hours.  ProBNP (last 3 results) No results for input(s): PROBNP in the last 8760 hours.  CBG: No results for input(s): GLUCAP in the last 168 hours.  Recent Results (from the past 240 hour(s))  Blood culture (routine x 2)     Status: None (Preliminary result)   Collection Time: 08/26/14  7:25 PM  Result Value Ref Range Status   Specimen Description LEFT ANTECUBITAL  Final   Special Requests BOTTLES DRAWN AEROBIC AND ANAEROBIC 5CC  Final   Culture   Final    NO GROWTH < 24 HOURS Performed at [facility]    Report Status PENDING  Incomplete  Blood culture (routine x 2)     Status: None (Preliminary result)   Collection Time: 08/26/14  8:20 PM  Result Value Ref Range Status   Specimen Description BLOOD RIGHT WRIST  Final   Special Requests BOTTLES DRAWN AEROBIC AND ANAEROBIC 5CC  Final   Culture   Final    NO GROWTH < 24 HOURS Performed at [facility]    Report Status PENDING  Incomplete     Studies: X-ray Chest Pa And Lateral  08/27/2014   CLINICAL DATA:  Acute onset of fever. Preoperative chest radiograph for abdominal surgery. Initial encounter.  EXAM: CHEST  2 VIEW  COMPARISON:  Chest radiograph performed 07/18/2014  FINDINGS: The lungs are well-aerated. Chronic left-sided pleural parenchymal scarring is unchanged in appearance. There is no evidence of focal opacification, pleural effusion or pneumothorax.  The heart is normal in size; the mediastinal contour is within normal limits. No acute osseous abnormalities are seen.  IMPRESSION: No acute cardiopulmonary process seen. Chronic left-sided pleural parenchymal scarring is unchanged in appearance.   Electronically Signed   By: Roanna Raider M.D.   On: 08/27/2014 02:21   Ct Image Guided Drainage Percut Cath  Peritoneal Retroperit  08/27/2014   CLINICAL DATA:  54 year old male with enlarging fluid collection along  the preperitoneal aspect of the left antral lateral abdominal wall accompanied by fever and leukocytosis. He presents for aspiration and possible drain placement.  EXAM: CT CORE BIOPSY RENAL  Date: 08/27/2014  PROCEDURE: 1. CT-guided drain placement Interventional Radiologist:  Sterling Big, MD  ANESTHESIA/SEDATION: Moderate (conscious) sedation was used. 2.5 mg Versed, 100 mcg Fentanyl were administered intravenously. The patient's vital signs were monitored continuously by radiology nursing throughout the procedure.  Sedation Time: 21 minutes  MEDICATIONS: None additional  TECHNIQUE: Informed consent was obtained from the patient following explanation of the procedure, risks, benefits and alternatives. The patient understands, agrees and consents for the procedure. All questions were addressed. A time out was performed.  A planning axial CT scan was performed. Fluid collection in the region of the left transversus abdominis muscle and in the perisplenic space was successfully identified. A suitable skin entry site was selected and marked. The region was sterilely prepped and draped in standard fashion using Betadine skin prep. Local anesthesia was attained by infiltration with 1% lidocaine. A small dermatotomy was made. Under CT guidance, an 18 gauge trocar  needle was advanced into the inferior aspect of the fluid collection. A 0.018 inch wire was then advanced superiorly toward the spleen. The skin tract was dilated to 10 [place]. A Cook 10 [place] multipurpose drainage catheter modified with additional sideholes was then advanced over the wire and formed. Aspiration yields approximately 80 mL of thick pearlescent bloody fluid. There appears to be purulent material mixed with the blood.  The collection was irrigated with 15 mL sterile saline which was re- aspirated. The drainage catheter was then connected to JP bulb suction. The drain was secured to the skin with 0 Prolene suture and an adhesive fixation  device. The patient tolerated the procedure well.  COMPLICATIONS: None  Estimated blood loss:  0  IMPRESSION: Successful placement of a 10 French drainage catheter in the left transversus abdominis muscle fluid collection. Aspiration year old 80 mL of purulent bloody material. Clinically, the aspirated material is concerning for superinfection of a postoperative hematoma.  A sample was sent for Gram stain and culture. The drainage tube is left to JP bulb suction.  Signed,  Sterling Big, MD  [facility]  [facility]   Electronically Signed   By: Malachy Moan M.D.   On: 08/27/2014 17:03    Scheduled Meds: . aspirin EC  81 mg Oral Daily  . diphenoxylate-atropine  1 tablet Oral TID  . enoxaparin (LOVENOX) injection  40 mg Subcutaneous QHS  . levofloxacin (LEVAQUIN) IV  750 mg Intravenous Q24H  . mesalamine  1,000 mg Rectal QHS  . mesalamine  1,000 mg Oral BID  . multivitamin  5 mL Oral Daily  . pantoprazole  40 mg Oral BID  . saccharomyces boulardii  250 mg Oral BID  . sodium chloride  3 mL Intravenous Q12H  . vancomycin  750 mg Intravenous Q12H   Continuous Infusions:    Principal Problem:   UTI (lower urinary tract infection) Active Problems:   Leukocytosis   Renal calculi   Ulcerative colitis   Fever   Abscess    Time spent: 25 minutes    Jeralyn Bennett  [facility] Pager [phone]. If 7PM-7AM, please contact night-coverage at [email or web address], password [facility] 08/28/2014, 12:37 PM  LOS: 2 days

## 2014-08-28 NOTE — Progress Notes (Signed)
Subjective: He says he feels great.  Drainage looks like   Objective: Vital signs in last 24 hours: Temp:  [97.7 F (36.5 C)-98.5 F (36.9 C)] 97.7 F (36.5 C) (08/04 0513) Pulse Rate:  [68-96] 80 (08/04 0513) Resp:  [15-21] 18 (08/04 0513) BP: (94-108)/(42-63) 108/61 mmHg (08/04 0513) SpO2:  [99 %-100 %] 100 % (08/04 0513) Weight:  [69.037 kg (152 lb 3.2 oz)] 69.037 kg (152 lb 3.2 oz) (08/04 0513) Last BM Date: 08/27/14 65 from the drain  Regular diet nothing recorded. Afebrile, VSS No labs will recheck now CT drain placement:  Aspiration year old 80 mL of purulent bloody material. Clinically, the aspirated material is concerning for superinfection of a postoperative hematoma. No culture results, or gram stain in EMR currently-  The labs has them I checked just not done yet.  Intake/Output from previous day: 08/03 0701 - 08/04 0700 In: 1810 [I.V.:1200; IV Piggyback:600] Out: 67 [Urine:2; Drains:65] Intake/Output this shift:    General appearance: alert, cooperative and no distress GI: soft, nontender.  Site OK, dressing intact.  drainage is a little serous and allot of old blood, I don't see any purulent appearing fluid.  Lab Results:   Recent Labs  08/26/14 1849 08/27/14 0455  WBC 19.2* 12.6*  HGB 10.8* 9.6*  HCT 32.4* 29.0*  PLT 304 248    BMET  Recent Labs  08/26/14 1849 08/27/14 0455  NA 133* 135  K 3.8 3.9  CL 100* 105  CO2 23 24  GLUCOSE 108* 105*  BUN 17 15  CREATININE 1.48* 1.39*  CALCIUM 8.3* 8.2*   PT/INR  Recent Labs  08/26/14 1821  LABPROT 14.7  INR 1.13     Recent Labs Lab 08/26/14 1849 08/27/14 0455  AST 17 13*  ALT 13* 11*  ALKPHOS 78 59  BILITOT 0.7 0.7  PROT 7.5 6.4*  ALBUMIN 3.4* 2.8*     Lipase     Component Value Date/Time   LIPASE 20* 08/26/2014 1821     Studies/Results: X-ray Chest Pa And Lateral  08/27/2014   CLINICAL DATA:  Acute onset of fever. Preoperative chest radiograph for abdominal surgery.  Initial encounter.  EXAM: CHEST  2 VIEW  COMPARISON:  Chest radiograph performed 07/18/2014  FINDINGS: The lungs are well-aerated. Chronic left-sided pleural parenchymal scarring is unchanged in appearance. There is no evidence of focal opacification, pleural effusion or pneumothorax.  The heart is normal in size; the mediastinal contour is within normal limits. No acute osseous abnormalities are seen.  IMPRESSION: No acute cardiopulmonary process seen. Chronic left-sided pleural parenchymal scarring is unchanged in appearance.   Electronically Signed   By: Garald Balding M.D.   On: 08/27/2014 02:21   Ct Image Guided Drainage Percut Cath  Peritoneal Retroperit  08/27/2014   CLINICAL DATA:  54 year old male with enlarging fluid collection along the preperitoneal aspect of the left antral lateral abdominal wall accompanied by fever and leukocytosis. He presents for aspiration and possible drain placement.  EXAM: CT CORE BIOPSY RENAL  Date: 08/27/2014  PROCEDURE: 1. CT-guided drain placement Interventional Radiologist:  Criselda Peaches, MD  ANESTHESIA/SEDATION: Moderate (conscious) sedation was used. 2.5 mg Versed, 100 mcg Fentanyl were administered intravenously. The patient's vital signs were monitored continuously by radiology nursing throughout the procedure.  Sedation Time: 21 minutes  MEDICATIONS: None additional  TECHNIQUE: Informed consent was obtained from the patient following explanation of the procedure, risks, benefits and alternatives. The patient understands, agrees and consents for the procedure. All questions were  addressed. A time out was performed.  A planning axial CT scan was performed. Fluid collection in the region of the left transversus abdominis muscle and in the perisplenic space was successfully identified. A suitable skin entry site was selected and marked. The region was sterilely prepped and draped in standard fashion using Betadine skin prep. Local anesthesia was attained by  infiltration with 1% lidocaine. A small dermatotomy was made. Under CT guidance, an 18 gauge trocar needle was advanced into the inferior aspect of the fluid collection. A 0.018 inch wire was then advanced superiorly toward the spleen. The skin tract was dilated to 10 [place]. A Cook 10 [place] multipurpose drainage catheter modified with additional sideholes was then advanced over the wire and formed. Aspiration yields approximately 80 mL of thick pearlescent bloody fluid. There appears to be purulent material mixed with the blood.  The collection was irrigated with 15 mL sterile saline which was re- aspirated. The drainage catheter was then connected to JP bulb suction. The drain was secured to the skin with 0 Prolene suture and an adhesive fixation device. The patient tolerated the procedure well.  COMPLICATIONS: None  Estimated blood loss:  0  IMPRESSION: Successful placement of a 10 French drainage catheter in the left transversus abdominis muscle fluid collection. Aspiration year old 80 mL of purulent bloody material. Clinically, the aspirated material is concerning for superinfection of a postoperative hematoma.  A sample was sent for Gram stain and culture. The drainage tube is left to JP bulb suction.  Signed,  Criselda Peaches, MD  [facility]  [facility]   Electronically Signed   By: Jacqulynn Cadet M.D.   On: 08/27/2014 17:03    Medications: . aspirin EC  81 mg Oral Daily  . diphenoxylate-atropine  1 tablet Oral TID  . enoxaparin (LOVENOX) injection  40 mg Subcutaneous QHS  . levofloxacin (LEVAQUIN) IV  750 mg Intravenous Q24H  . mesalamine  1,000 mg Rectal QHS  . mesalamine  1,000 mg Oral BID  . multivitamin  5 mL Oral Daily  . pantoprazole (PROTONIX) IV  40 mg Intravenous Q12H  . saccharomyces boulardii  250 mg Oral BID  . sodium chloride  3 mL Intravenous Q12H  . vancomycin  750 mg Intravenous Q12H    Assessment/Plan Abdominal pain,  and fever with abnormal CT: abdomen with thickened left transversalis Abscess vs hematoma Recent large left renal stone/UTI, with CYSTOSCOPY WITH RETROGRADE PYELOGRAM, URETEROSCOPY AND BILATERAL STENT PLACEMENT (Bilateral) HOLMIUM LASER APPLICATION, LEFT ONLY, 07/25/14, Dr. Alexis Frock Right renal stone- nonobstructive UTI 11-20 WBC/HPF Blood cultures pending Hx of ulcerative colitis, cecostomy and then total colectomy 02/2012, followed by 4 more surgeries after; includingILEOSTOMY TAKEDOWN WITH ILEOPROCTOSTOMY, 05/27/13, Dr. Excell Seltzer Mild renal insuffiencey, creatinine 1.48 on admit  Antibiotics: Day 3 of Levofloxacin/Vancomycin DVT: SCD's/he could have heparin for DVT also.   Plan;  He is on IV antibiotics, due at 10AM and 2200 PM.  I am rechecking labs. Pharmacy is dosing Vancomycin. Culture is pending; no organisms seen on gram stain.    LOS: 2 days    Cassandra Mcmanaman 08/28/2014

## 2014-08-28 NOTE — Progress Notes (Signed)
Referring Physician(s): CCS  Chief Complaint: Anterior abdominal wall fluid collection S/p perc drain placed by IR 08/27/14  Subjective: He states he feels much better, he denies any fevers or significant pain. He is tolerating a regular diet without N/V  Allergies: Penicillins; Clindamycin/lincomycin; Morphine and related; Claritin; and Primaxin  Medications: Prior to Admission medications   Medication Sig Start Date End Date Taking? Authorizing Provider  acetaminophen (TYLENOL) 500 MG tablet Take 1,000 mg by mouth every 6 (six) hours as needed for fever.   Yes Historical Provider, MD  aspirin EC 81 MG tablet Take 81 mg by mouth daily.   Yes Historical Provider, MD  ciprofloxacin (CIPRO) 500 MG tablet Take 1 tablet (500 mg total) by mouth 2 (two) times daily. X 3 days. Begin day prior to next Urology appointment. 07/25/14  Yes Alexis Frock, MD  ciprofloxacin (CIPRO) 500 MG tablet Take 500 mg by mouth 2 (two) times daily.   Yes Historical Provider, MD  diphenoxylate-atropine (LOMOTIL) 2.5-0.025 MG per tablet Take 1 tablet by mouth 4 (four) times daily. Patient taking differently: Take 1 tablet by mouth 3 (three) times daily.  05/12/14  Yes Rogene Houston, MD  mesalamine (CANASA) 1000 MG suppository Place 1 suppository (1,000 mg total) rectally at bedtime. 05/19/14  Yes Rogene Houston, MD  mesalamine (PENTASA) 500 MG CR capsule Take 2 capsules (1,000 mg total) by mouth 2 (two) times daily. Patient takes 2 by mouth twice daily 03/18/14  Yes Rogene Houston, MD  oxyCODONE-acetaminophen (PERCOCET/ROXICET) 5-325 MG per tablet Take 1 tablet by mouth every 4 (four) hours as needed for moderate pain or severe pain. 07/25/14  Yes Alexis Frock, MD  promethazine (PHENERGAN) 12.5 MG tablet Take 1 tablet (12.5 mg total) by mouth every 6 (six) hours as needed for nausea or vomiting. 07/25/14  Yes Alexis Frock, MD  saccharomyces boulardii (FLORASTOR) 250 MG capsule Take 250 mg by mouth 2 (two) times  daily.    Yes Historical Provider, MD    Vital Signs: BP 107/69 mmHg  Pulse 79  Temp(Src) 98.3 F (36.8 C) (Oral)  Resp 18  Ht 5' 10"  (1.778 m)  Wt 152 lb 3.2 oz (69.037 kg)  BMI 21.84 kg/m2  SpO2 100%  Physical Exam General: A&Ox3, NAD Abd: Soft, ND, NT, LLQ drain intact 25cc/24 hrs (65cc), bloody output in bulb  Imaging: X-ray Chest Pa And Lateral  08/27/2014   CLINICAL DATA:  Acute onset of fever. Preoperative chest radiograph for abdominal surgery. Initial encounter.  EXAM: CHEST  2 VIEW  COMPARISON:  Chest radiograph performed 07/18/2014  FINDINGS: The lungs are well-aerated. Chronic left-sided pleural parenchymal scarring is unchanged in appearance. There is no evidence of focal opacification, pleural effusion or pneumothorax.  The heart is normal in size; the mediastinal contour is within normal limits. No acute osseous abnormalities are seen.  IMPRESSION: No acute cardiopulmonary process seen. Chronic left-sided pleural parenchymal scarring is unchanged in appearance.   Electronically Signed   By: Garald Balding M.D.   On: 08/27/2014 02:21   Ct Image Guided Drainage Percut Cath  Peritoneal Retroperit  08/27/2014   CLINICAL DATA:  54 year old male with enlarging fluid collection along the preperitoneal aspect of the left antral lateral abdominal wall accompanied by fever and leukocytosis. He presents for aspiration and possible drain placement.  EXAM: CT CORE BIOPSY RENAL  Date: 08/27/2014  PROCEDURE: 1. CT-guided drain placement Interventional Radiologist:  Criselda Peaches, MD  ANESTHESIA/SEDATION: Moderate (conscious) sedation was used. 2.5 mg  Versed, 100 mcg Fentanyl were administered intravenously. The patient's vital signs were monitored continuously by radiology nursing throughout the procedure.  Sedation Time: 21 minutes  MEDICATIONS: None additional  TECHNIQUE: Informed consent was obtained from the patient following explanation of the procedure, risks, benefits and  alternatives. The patient understands, agrees and consents for the procedure. All questions were addressed. A time out was performed.  A planning axial CT scan was performed. Fluid collection in the region of the left transversus abdominis muscle and in the perisplenic space was successfully identified. A suitable skin entry site was selected and marked. The region was sterilely prepped and draped in standard fashion using Betadine skin prep. Local anesthesia was attained by infiltration with 1% lidocaine. A small dermatotomy was made. Under CT guidance, an 18 gauge trocar needle was advanced into the inferior aspect of the fluid collection. A 0.018 inch wire was then advanced superiorly toward the spleen. The skin tract was dilated to 10 [place]. A Cook 10 [place] multipurpose drainage catheter modified with additional sideholes was then advanced over the wire and formed. Aspiration yields approximately 80 mL of thick pearlescent bloody fluid. There appears to be purulent material mixed with the blood.  The collection was irrigated with 15 mL sterile saline which was re- aspirated. The drainage catheter was then connected to JP bulb suction. The drain was secured to the skin with 0 Prolene suture and an adhesive fixation device. The patient tolerated the procedure well.  COMPLICATIONS: None  Estimated blood loss:  0  IMPRESSION: Successful placement of a 10 French drainage catheter in the left transversus abdominis muscle fluid collection. Aspiration year old 80 mL of purulent bloody material. Clinically, the aspirated material is concerning for superinfection of a postoperative hematoma.  A sample was sent for Gram stain and culture. The drainage tube is left to JP bulb suction.  Signed,  Criselda Peaches, MD  [facility]  [facility]   Electronically Signed   By: Jacqulynn Cadet M.D.   On: 08/27/2014 17:03    Labs:  CBC:  Recent Labs  07/19/14 0455  08/26/14 1849 08/27/14 0455 08/28/14 1120  WBC 6.8 19.2* 12.6* 5.5  HGB 10.6* 10.8* 9.6* 9.5*  HCT 31.6* 32.4* 29.0* 28.7*  PLT 49* 304 248 262    COAGS:  Recent Labs  08/26/14 1821  INR 1.13    BMP:  Recent Labs  07/20/14 0514 08/26/14 1849 08/27/14 0455 08/28/14 1120  NA 135 133* 135 135  K 4.0 3.8 3.9 4.3  CL 107 100* 105 104  CO2 21* 23 24 24   GLUCOSE 105* 108* 105* 94  BUN 20 17 15 19   CALCIUM 7.7* 8.3* 8.2* 8.0*  CREATININE 1.89* 1.48* 1.39* 1.31*  GFRNONAA 38* 51* 55* 59*  GFRAA 44* 59* >60 >60    LIVER FUNCTION TESTS:  Recent Labs  06/02/14 1517 08/26/14 1849 08/27/14 0455  BILITOT 0.2 0.7 0.7  AST 13 17 13*  ALT 11 13* 11*  ALKPHOS 69 78 59  PROT 5.7* 7.5 6.4*  ALBUMIN 3.3* 3.4* 2.8*    Assessment and Plan: Hx of ulcerative colitis, cecostomy and then total colectomy 02/2012, followed by multiple surgeries ILEOSTOMY TAKEDOWN WITH ILEOPROCTOSTOMY, 05/27/13, Dr. Excell Seltzer Anterior abdominal wall fluid collection S/p perc drain placed by IR 08/27/14 Cx pending-follow, good thick bloody output, wbc trending down now wnl, afebrile Plans per CCS   Signed: Tsosie Billing D 08/28/2014, 2:57 PM   I spent a total of 15  Minutes in face to face in clinical consultation/evaluation, greater than 50% of which was counseling/coordinating care for anterior abdominal wall collection.

## 2014-08-29 ENCOUNTER — Other Ambulatory Visit: Payer: Self-pay | Admitting: Radiology

## 2014-08-29 DIAGNOSIS — L0291 Cutaneous abscess, unspecified: Secondary | ICD-10-CM

## 2014-08-29 DIAGNOSIS — L02211 Cutaneous abscess of abdominal wall: Secondary | ICD-10-CM

## 2014-08-29 LAB — CBC
HCT: 28.6 % — ABNORMAL LOW (ref 39.0–52.0)
HEMOGLOBIN: 9.6 g/dL — AB (ref 13.0–17.0)
MCH: 30 pg (ref 26.0–34.0)
MCHC: 33.6 g/dL (ref 30.0–36.0)
MCV: 89.4 fL (ref 78.0–100.0)
Platelets: 305 10*3/uL (ref 150–400)
RBC: 3.2 MIL/uL — ABNORMAL LOW (ref 4.22–5.81)
RDW: 14 % (ref 11.5–15.5)
WBC: 6.4 10*3/uL (ref 4.0–10.5)

## 2014-08-29 LAB — BASIC METABOLIC PANEL
Anion gap: 8 (ref 5–15)
BUN: 22 mg/dL — AB (ref 6–20)
CO2: 23 mmol/L (ref 22–32)
CREATININE: 1.44 mg/dL — AB (ref 0.61–1.24)
Calcium: 8.4 mg/dL — ABNORMAL LOW (ref 8.9–10.3)
Chloride: 107 mmol/L (ref 101–111)
GFR calc Af Amer: >60 mL/min (ref >60)
GFR calc non Af Amer: 53 mL/min — ABNORMAL LOW (ref >60)
GLUCOSE: 103 mg/dL — AB (ref 65–99)
Potassium: 4.4 mmol/L (ref 3.5–5.1)
Sodium: 138 mmol/L (ref 135–145)

## 2014-08-29 MED ORDER — LEVOFLOXACIN 500 MG PO TABS: 500.0000 mg | ORAL_TABLET | Freq: Every day | ORAL | Status: DC

## 2014-08-29 NOTE — Progress Notes (Signed)
Subjective: Going home today, feels good, drainage is still bloody serous and a little cloudy.  Objective: Vital signs in last 24 hours: Temp:  [97.7 F (36.5 C)-98.3 F (36.8 C)] 97.7 F (36.5 C) (08/05 0549) Pulse Rate:  [79-87] 87 (08/05 0549) Resp:  [18] 18 (08/05 0549) BP: (101-113)/(66-69) 101/66 mmHg (08/05 0549) SpO2:  [100 %] 100 % (08/05 0549) Weight:  [68.992 kg (152 lb 1.6 oz)] 68.992 kg (152 lb 1.6 oz) (08/05 0549) Last BM Date: 08/26/14  Intake/Output from previous day: 08/04 0701 - 08/05 0700 In: 825 [P.O.:360; IV Piggyback:450] Out: 67 [Urine:2; Drains:65] Intake/Output this shift: Total I/O In: 120 [P.O.:120] Out: -   General appearance: alert, cooperative and no distress GI: comfortable, no issues or complaints, drain as noted.    Lab Results:   Recent Labs  08/28/14 1120 08/29/14 0400  WBC 5.5 6.4  HGB 9.5* 9.6*  HCT 28.7* 28.6*  PLT 262 305    BMET  Recent Labs  08/28/14 1120 08/29/14 0400  NA 135 138  K 4.3 4.4  CL 104 107  CO2 24 23  GLUCOSE 94 103*  BUN 19 22*  CREATININE 1.31* 1.44*  CALCIUM 8.0* 8.4*   PT/INR  Recent Labs  08/26/14 1821  LABPROT 14.7  INR 1.13     Recent Labs Lab 08/26/14 1849 08/27/14 0455  AST 17 13*  ALT 13* 11*  ALKPHOS 78 59  BILITOT 0.7 0.7  PROT 7.5 6.4*  ALBUMIN 3.4* 2.8*     Lipase     Component Value Date/Time   LIPASE 20* 08/26/2014 1821     Studies/Results: Ct Image Guided Drainage Percut Cath  Peritoneal Retroperit  08/27/2014   CLINICAL DATA:  54 year old male with enlarging fluid collection along the preperitoneal aspect of the left antral lateral abdominal wall accompanied by fever and leukocytosis. He presents for aspiration and possible drain placement.  EXAM: CT CORE BIOPSY RENAL  Date: 08/27/2014  PROCEDURE: 1. CT-guided drain placement Interventional Radiologist:  Criselda Peaches, MD  ANESTHESIA/SEDATION: Moderate (conscious) sedation was used. 2.5 mg Versed, 100  mcg Fentanyl were administered intravenously. The patient's vital signs were monitored continuously by radiology nursing throughout the procedure.  Sedation Time: 21 minutes  MEDICATIONS: None additional  TECHNIQUE: Informed consent was obtained from the patient following explanation of the procedure, risks, benefits and alternatives. The patient understands, agrees and consents for the procedure. All questions were addressed. A time out was performed.  A planning axial CT scan was performed. Fluid collection in the region of the left transversus abdominis muscle and in the perisplenic space was successfully identified. A suitable skin entry site was selected and marked. The region was sterilely prepped and draped in standard fashion using Betadine skin prep. Local anesthesia was attained by infiltration with 1% lidocaine. A small dermatotomy was made. Under CT guidance, an 18 gauge trocar needle was advanced into the inferior aspect of the fluid collection. A 0.018 inch wire was then advanced superiorly toward the spleen. The skin tract was dilated to 10 [place]. A Cook 10 [place] multipurpose drainage catheter modified with additional sideholes was then advanced over the wire and formed. Aspiration yields approximately 80 mL of thick pearlescent bloody fluid. There appears to be purulent material mixed with the blood.  The collection was irrigated with 15 mL sterile saline which was re- aspirated. The drainage catheter was then connected to JP bulb suction. The drain was secured to the skin with 0 Prolene suture and an adhesive  fixation device. The patient tolerated the procedure well.  COMPLICATIONS: None  Estimated blood loss:  0  IMPRESSION: Successful placement of a 10 French drainage catheter in the left transversus abdominis muscle fluid collection. Aspiration year old 80 mL of purulent bloody material. Clinically, the aspirated material is concerning for superinfection of a postoperative hematoma.  A sample  was sent for Gram stain and culture. The drainage tube is left to JP bulb suction.  Signed,  Criselda Peaches, MD  [facility]  [facility]   Electronically Signed   By: Jacqulynn Cadet M.D.   On: 08/27/2014 17:03    Medications: . aspirin EC  81 mg Oral Daily  . diphenoxylate-atropine  1 tablet Oral TID  . enoxaparin (LOVENOX) injection  40 mg Subcutaneous QHS  . levofloxacin (LEVAQUIN) IV  750 mg Intravenous Q24H  . mesalamine  1,000 mg Rectal QHS  . mesalamine  1,000 mg Oral BID  . multivitamin  5 mL Oral Daily  . pantoprazole  40 mg Oral BID  . saccharomyces boulardii  250 mg Oral BID  . sodium chloride  3 mL Intravenous Q12H  . vancomycin  750 mg Intravenous Q12H    Assessment/Plan Abdominal pain, and fever with abnormal CT: abdomen with thickened left transversalis Abscess vs hematoma CT guided drainage 08/27/14 Recent large left renal stone/UTI, with CYSTOSCOPY WITH RETROGRADE PYELOGRAM, URETEROSCOPY AND BILATERAL STENT PLACEMENT (Bilateral) HOLMIUM LASER APPLICATION, LEFT ONLY, 07/25/14, Dr. Alexis Frock Right renal stone- nonobstructive UTI 11-20 WBC/HPF Blood cultures pending Hx of ulcerative colitis, cecostomy and then total colectomy 02/2012, followed by 4 more surgeries after; includingILEOSTOMY TAKEDOWN WITH ILEOPROCTOSTOMY, 05/27/13, Dr. Excell Seltzer Mild renal insuffiencey, creatinine 1.48 on admit  Antibiotics: Day 3 of Levofloxacin/Vancomycin going home today on Cipro DVT: SCD's/he could have heparin for DVT also.  Plan:  He is going home today on Cipro, I have changed the follow up to Dr. Excell Seltzer.  I don't think this has anything to do with his surgery from 2014.  I have ask him to follow up with is PCP.  I will let Dr. Excell Seltzer know about his admission and follow up.      LOS: 3 days    Connor Small 08/29/2014

## 2014-08-29 NOTE — Progress Notes (Signed)
ANTIBIOTIC CONSULT NOTE - FOLLOW UP  Pharmacy Consult for vancomycin and levaquin Indication: UTI, suspected superinfected hematoma  Allergies  Allergen Reactions  . Penicillins Other (See Comments)    "Heart rate slowed down"  bradycardia  . Clindamycin/Lincomycin Diarrhea    C-diff  . Morphine And Related Nausea And Vomiting  . Claritin [Loratadine] Rash  . Primaxin [Imipenem] Rash    Patient Measurements: Height: 5' 10"  (177.8 cm) Weight: 152 lb 1.6 oz (68.992 kg) IBW/kg (Calculated) : 73   Vital Signs: Temp: 97.7 F (36.5 C) (08/05 0549) Temp Source: Oral (08/05 0549) BP: 101/66 mmHg (08/05 0549) Pulse Rate: 87 (08/05 0549) Intake/Output from previous day: 08/04 0701 - 08/05 0700 In: 825 [P.O.:360; IV Piggyback:450] Out: 67 [Urine:2; Drains:65] Intake/Output from this shift: Total I/O In: 120 [P.O.:120] Out: -   Labs:  Recent Labs  08/27/14 0455 08/28/14 1120 08/29/14 0400  WBC 12.6* 5.5 6.4  HGB 9.6* 9.5* 9.6*  PLT 248 262 305  CREATININE 1.39* 1.31* 1.44*   Estimated Creatinine Clearance: 55.9 mL/min (by C-G formula based on Cr of 1.44). No results for input(s): VANCOTROUGH, VANCOPEAK, VANCORANDOM, GENTTROUGH, GENTPEAK, GENTRANDOM, TOBRATROUGH, TOBRAPEAK, TOBRARND, AMIKACINPEAK, AMIKACINTROU, AMIKACIN in the last 72 hours.   Microbiology: Recent Results (from the past 720 hour(s))  Blood culture (routine x 2)     Status: None (Preliminary result)   Collection Time: 08/26/14  7:25 PM  Result Value Ref Range Status   Specimen Description LEFT ANTECUBITAL  Final   Special Requests BOTTLES DRAWN AEROBIC AND ANAEROBIC 5CC  Final   Culture   Final    NO GROWTH 2 DAYS Performed at [facility]    Report Status PENDING  Incomplete  Blood culture (routine x 2)     Status: None (Preliminary result)   Collection Time: 08/26/14  8:20 PM  Result Value Ref Range Status   Specimen Description BLOOD RIGHT WRIST  Final   Special Requests BOTTLES DRAWN  AEROBIC AND ANAEROBIC 5CC  Final   Culture   Final    NO GROWTH 2 DAYS Performed at [facility]    Report Status PENDING  Incomplete  Culture, routine-abscess     Status: None (Preliminary result)   Collection Time: 08/27/14  3:10 PM  Result Value Ref Range Status   Specimen Description ABDOMEN LLQ ANTERIOR ABDOMINAL WALL  Final   Special Requests Normal  Final   Gram Stain   Final    ABUNDANT WBC PRESENT,BOTH PMN AND MONONUCLEAR NO SQUAMOUS EPITHELIAL CELLS SEEN NO ORGANISMS SEEN Performed at [facility]    Culture PENDING  Incomplete   Report Status PENDING  Incomplete    Anti-infectives    Start     Dose/Rate Route Frequency Ordered Stop   08/27/14 1000  vancomycin (VANCOCIN) IVPB 750 mg/150 ml premix     750 mg 150 mL/hr over 60 Minutes Intravenous Every 12 hours 08/26/14 2051     08/26/14 2200  levofloxacin (LEVAQUIN) IVPB 750 mg     750 mg 100 mL/hr over 90 Minutes Intravenous Every 24 hours 08/26/14 2051     08/26/14 1930  vancomycin (VANCOCIN) IVPB 1000 mg/200 mL premix     1,000 mg 200 mL/hr over 60 Minutes Intravenous STAT 08/26/14 1923 08/26/14 2135   08/26/14 1930  cefTAZidime (FORTAZ) 2 g in dextrose 5 % 50 mL IVPB  Status:  Discontinued     2 g 100 mL/hr over 30 Minutes Intravenous STAT 08/26/14 1924 08/26/14 2242  Assessment: Patient's a 54 y.o M with hx cystoscopy and ureteroscopy and BL stent placement in July.  Outpatient CT showed increasing thickness of the left transversalis abdominal muscle with suspicion for abscess vs hematoma -- s/p aspirationwith perc drain placement on 8/4. IR suspects "superinfected hematoma."  He's currently on vancomycin and levaquin day #4 for broad coverage.  8/2 >> Vancomycin Rx>> 8/2 >> Levaquin Rx>>  8/2 blood x2: ngtd 8/3 abd wall PP:NDLO  Today, 08/29/2014:  Afebrile, wbc wnl  Scr up 1.44 (crcl~56)  Goal of Therapy:  Vancomycin trough level 15-20 mcg/ml  Plan:  - cont Vanc 751m q12h -  cont Levaquin [id number] - with plan to transition to oral abx soon, will hold off on checking vancomycin level   Edwyn Inclan P 08/29/2014,9:18 AM

## 2014-08-29 NOTE — Discharge Summary (Signed)
Physician Discharge Summary  Connor Small ZOX:[id number] DOB: [birth year] DOA: 08/26/2014  PCP: Eartha Inch, MD  Admit date: 08/26/2014 Discharge date: 08/29/2014  Time spent: 35 minutes  Recommendations for Outpatient Follow-up:  1. He was discharged with perc drain, instructed to flush daily. Will f/u at drain clinic in 1 week 2. Follow up on BMP and CBC in 1 week   Discharge Diagnoses:  Principal Problem:   UTI (lower urinary tract infection) Active Problems:   Leukocytosis   Renal calculi   Ulcerative colitis   Fever   Abscess   Discharge Condition: Stable  Diet recommendation: Regular Diet  Filed Weights   08/27/14 0434 08/28/14 0513 08/29/14 0549  Weight: 70.3 kg (154 lb 15.7 oz) 69.037 kg (152 lb 3.2 oz) 68.992 kg (152 lb 1.6 oz)    History of present illness:  54 yo male with fever starting on Friday, Intermittent, + abdominal pain left lower quadrant, near scar, Pt had a CT scan yesterday, and sent the results to pcp => ? Abscess vs hematoma. Pt was sent to the ED for evaluation. Pt denies cough, cp, palp, sob, flank pain, dysuria, hematuria. Pt was noted to have uti. ED has consulted surgery for evaluation of abscess vs hematoma. 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 CT scan of abdomen and pelvis done at outside facility showed possibility of abscess versus hematoma. General surgery was consulted. He was started on empiric IV antibiotic therapy with Levaquin and vancomycin. General surgery recommending  aspiration. Patient otherwise showing significant improvement with IV antibiotic therapy.   Left flank abdominal pain -Patient having a CT scan of abdomen and pelvis performed at outside facility that showed thickened left transversalis could be consistent with abscess versus hematoma. He presented with a fever of 103. -On 08/27/2014 patient undergoing placement of drain into the left lower quadrant abdominal wall, radiology reporting removing 80 mL of pearlescent bloody fluid -No organisms seen on culture, abundant WBCs present, blood culture obtained on 08/26/2014 showed no growth to date -Patient was treated with IV vancomycin and Levaquin -Will discharge on Levaquin 500 mg by mouth daily  2. Urinary tract infection. -Patient undergoing recent urologic procedure as he underwent stenting and lithotripsy. -Urinalysis showing the presence of many bacteria -He was discharged on Levaquin  3. History of paroxysmal atrial fibrillation -Currently remains in normal sinus rhythm  4. History of ulcerative colitis -Does not appear to have acute issues, continue mesalamine  5. Acute kidney injury. -Likely secondary to hypovolemia/prerenal azotemia -His creatinine trended down from 2.05-1.39 on a.m. lab work  Procedures:  Status post percutaneous drain placement by interventional radiology on 08/27/2014  Consultations:  General surgery  Interventional radiology  Discharge Exam: Filed Vitals:   08/29/14 0549  BP: 101/66  Pulse: 87  Temp: 97.7 F (36.5 C)  Resp: 18     General: Patient is nontoxic appearing, awake and alert oriented 3  Cardiovascular: Regular rate and rhythm normal S1-S2 no murmurs rubs or gallops  Respiratory: Normal respiratory effort, lungs are clear to auscultation bilaterally  Abdomen: Abdominal exam is benign, he has old surgical incision  sites over abdomen with no evidence of acute infection, overall his abdomen was soft nontender nondistended,  percutaneous drain in place  Musculoskeletal: No edema  Discharge Instructions   Discharge Instructions    Call MD for:  difficulty breathing, headache or visual disturbances    Complete by:  As directed      Call MD for:  extreme fatigue    Complete by:  As directed      Call MD for:  hives    Complete by:  As directed      Call MD for:  persistant dizziness or light-headedness    Complete by:  As directed      Call MD for:  persistant nausea and vomiting    Complete by:  As directed      Call MD for:  redness, tenderness, or signs of infection (pain, swelling, redness, odor or green/yellow discharge around incision site)    Complete by:  As directed      Call MD for:  severe uncontrolled pain    Complete by:  As directed      Call MD for:  temperature >100.4    Complete by:  As directed      Call MD for:    Complete by:  As directed      Diet - low sodium heart healthy    Complete by:  As directed      Increase activity slowly    Complete by:  As directed           Current Discharge Medication List    START taking these medications   Details  levofloxacin (LEVAQUIN) 500 MG tablet Take 1 tablet (500 mg total) by mouth daily. Qty: 7 tablet, Refills: 0      CONTINUE these medications which have NOT CHANGED   Details  acetaminophen (TYLENOL) 500 MG tablet Take 1,000 mg by mouth every 6 (six) hours as needed for fever.    aspirin EC 81 MG tablet Take 81 mg by mouth daily.    diphenoxylate-atropine (LOMOTIL) 2.5-0.025 MG per tablet Take 1 tablet by mouth 4 (four) times daily. Qty: 120 tablet, Refills: 3   Associated Diagnoses: Diarrhea    mesalamine (CANASA) 1000 MG suppository Place 1 suppository (1,000 mg total) rectally at bedtime. Qty: 30 suppository, Refills: 11    mesalamine (PENTASA) 500 MG CR capsule Take 2 capsules (1,000 mg total) by mouth 2 (two) times daily. Patient takes 2 by mouth twice daily Qty: 360 capsule, Refills: 3   Associated Diagnoses: H/O  ulcerative colitis    oxyCODONE-acetaminophen (PERCOCET/ROXICET) 5-325 MG per tablet Take 1 tablet by mouth every 4 (four) hours as needed for moderate pain or severe pain. Qty: 30 tablet, Refills: 0    promethazine (PHENERGAN) 12.5 MG tablet Take 1 tablet (12.5 mg total) by mouth every 6 (six) hours as needed for nausea or vomiting. Qty: 30 tablet, Refills: 0    saccharomyces boulardii (FLORASTOR) 250 MG capsule Take 250 mg by mouth 2 (two) times daily.       STOP taking these medications     ciprofloxacin (CIPRO) 500 MG tablet      ciprofloxacin (CIPRO) 500 MG tablet      Multiple Vitamin (MULTIVITAMIN) LIQD        Allergies  Allergen Reactions  . Penicillins Other (See Comments)    "Heart rate slowed down"  bradycardia  . Clindamycin/Lincomycin Diarrhea    C-diff  . Morphine And Related Nausea And Vomiting  .  Claritin [Loratadine] Rash  . Primaxin [Imipenem] Rash   Follow-up Information    Follow up with Berneta Levins, PA-C In 1 week.   Specialty:  Radiology   Contact information:   [address] [place] [address] [phone]       Follow up with Atilano Ina, MD In 1 week.   Specialty:  General Surgery   Contact information:   [address] [place] [address] [phone]        The results of significant diagnostics from this hospitalization (including imaging, microbiology, ancillary and laboratory) are listed below for reference.    Significant Diagnostic Studies: X-ray Chest Pa And Lateral  08/27/2014   CLINICAL DATA:  Acute onset of fever. Preoperative chest radiograph for abdominal surgery. Initial encounter.  EXAM: CHEST  2 VIEW  COMPARISON:  Chest radiograph performed 07/18/2014  FINDINGS: The lungs are well-aerated. Chronic left-sided pleural parenchymal scarring is unchanged in appearance. There is no evidence of focal opacification, pleural effusion or pneumothorax.  The heart is normal in size; the mediastinal  contour is within normal limits. No acute osseous abnormalities are seen.  IMPRESSION: No acute cardiopulmonary process seen. Chronic left-sided pleural parenchymal scarring is unchanged in appearance.   Electronically Signed   By: Roanna Raider M.D.   On: 08/27/2014 02:21   Ct Image Guided Drainage Percut Cath  Peritoneal Retroperit  08/27/2014   CLINICAL DATA:  54 year old male with enlarging fluid collection along the preperitoneal aspect of the left antral lateral abdominal wall accompanied by fever and leukocytosis. He presents for aspiration and possible drain placement.  EXAM: CT CORE BIOPSY RENAL  Date: 08/27/2014  PROCEDURE: 1. CT-guided drain placement Interventional Radiologist:  Sterling Big, MD  ANESTHESIA/SEDATION: Moderate (conscious) sedation was used. 2.5 mg Versed, 100 mcg Fentanyl were administered intravenously. The patient's vital signs were monitored continuously by radiology nursing throughout the procedure.  Sedation Time: 21 minutes  MEDICATIONS: None additional  TECHNIQUE: Informed consent was obtained from the patient following explanation of the procedure, risks, benefits and alternatives. The patient understands, agrees and consents for the procedure. All questions were addressed. A time out was performed.  A planning axial CT scan was performed. Fluid collection in the region of the left transversus abdominis muscle and in the perisplenic space was successfully identified. A suitable skin entry site was selected and marked. The region was sterilely prepped and draped in standard fashion using Betadine skin prep. Local anesthesia was attained by infiltration with 1% lidocaine. A small dermatotomy was made. Under CT guidance, an 18 gauge trocar needle was advanced into the inferior aspect of the fluid collection. A 0.018 inch wire was then advanced superiorly toward the spleen. The skin tract was dilated to 10 [place]. A Cook 10 [place] multipurpose drainage catheter modified with  additional sideholes was then advanced over the wire and formed. Aspiration yields approximately 80 mL of thick pearlescent bloody fluid. There appears to be purulent material mixed with the blood.  The collection was irrigated with 15 mL sterile saline which was re- aspirated. The drainage catheter was then connected to JP bulb suction. The drain was secured to the skin with 0 Prolene suture and an adhesive fixation device. The patient tolerated the procedure well.  COMPLICATIONS: None  Estimated blood loss:  0  IMPRESSION: Successful placement of a 10 French drainage catheter in the left transversus abdominis muscle fluid collection. Aspiration year old 80 mL of purulent bloody material. Clinically, the  aspirated material is concerning for superinfection of a postoperative hematoma.  A sample was sent for Gram stain and culture. The drainage tube is left to JP bulb suction.  Signed,  Sterling Big, MD  [facility]  [facility]   Electronically Signed   By: Malachy Moan M.D.   On: 08/27/2014 17:03    Microbiology: Recent Results (from the past 240 hour(s))  Blood culture (routine x 2)     Status: None (Preliminary result)   Collection Time: 08/26/14  7:25 PM  Result Value Ref Range Status   Specimen Description LEFT ANTECUBITAL  Final   Special Requests BOTTLES DRAWN AEROBIC AND ANAEROBIC 5CC  Final   Culture   Final    NO GROWTH 2 DAYS Performed at [facility]    Report Status PENDING  Incomplete  Blood culture (routine x 2)     Status: None (Preliminary result)   Collection Time: 08/26/14  8:20 PM  Result Value Ref Range Status   Specimen Description BLOOD RIGHT WRIST  Final   Special Requests BOTTLES DRAWN AEROBIC AND ANAEROBIC 5CC  Final   Culture   Final    NO GROWTH 2 DAYS Performed at [facility]    Report Status PENDING  Incomplete  Culture, routine-abscess     Status: None (Preliminary result)   Collection  Time: 08/27/14  3:10 PM  Result Value Ref Range Status   Specimen Description ABDOMEN LLQ ANTERIOR ABDOMINAL WALL  Final   Special Requests Normal  Final   Gram Stain   Final    ABUNDANT WBC PRESENT,BOTH PMN AND MONONUCLEAR NO SQUAMOUS EPITHELIAL CELLS SEEN NO ORGANISMS SEEN Performed at [facility]    Culture PENDING  Incomplete   Report Status PENDING  Incomplete     Labs: Basic Metabolic Panel:  Recent Labs Lab 08/26/14 1849 08/27/14 0455 08/28/14 1120 08/29/14 0400  NA 133* 135 135 138  K 3.8 3.9 4.3 4.4  CL 100* 105 104 107  CO2 23 24 24 23   GLUCOSE 108* 105* 94 103*  BUN 17 15 19  22*  CREATININE 1.48* 1.39* 1.31* 1.44*  CALCIUM 8.3* 8.2* 8.0* 8.4*   Liver Function Tests:  Recent Labs Lab 08/26/14 1849 08/27/14 0455  AST 17 13*  ALT 13* 11*  ALKPHOS 78 59  BILITOT 0.7 0.7  PROT 7.5 6.4*  ALBUMIN 3.4* 2.8*    Recent Labs Lab 08/26/14 1821  LIPASE 20*  AMYLASE 54   No results for input(s): AMMONIA in the last 168 hours. CBC:  Recent Labs Lab 08/26/14 1849 08/27/14 0455 08/28/14 1120 08/29/14 0400  WBC 19.2* 12.6* 5.5 6.4  NEUTROABS 14.8*  --   --   --   HGB 10.8* 9.6* 9.5* 9.6*  HCT 32.4* 29.0* 28.7* 28.6*  MCV 88.0 89.2 90.0 89.4  PLT 304 248 262 305   Cardiac Enzymes: No results for input(s): CKTOTAL, CKMB, CKMBINDEX, TROPONINI in the last 168 hours. BNP: BNP (last 3 results) No results for input(s): BNP in the last 8760 hours.  ProBNP (last 3 results) No results for input(s): PROBNP in the last 8760 hours.  CBG: No results for input(s): GLUCAP in the last 168 hours.     SignedJeralyn Bennett  [facility] 08/29/2014, 9:46 AM

## 2014-08-31 LAB — CULTURE, BLOOD (ROUTINE X 2)
Culture: NO GROWTH
Culture: NO GROWTH

## 2014-08-31 LAB — CULTURE, ROUTINE-ABSCESS
Culture: NO GROWTH
Special Requests: NORMAL

## 2014-09-01 ENCOUNTER — Encounter (INDEPENDENT_AMBULATORY_CARE_PROVIDER_SITE_OTHER): Payer: Self-pay | Admitting: *Deleted

## 2014-09-01 ENCOUNTER — Other Ambulatory Visit: Payer: Self-pay | Admitting: General Surgery

## 2014-09-01 ENCOUNTER — Other Ambulatory Visit: Payer: Self-pay | Admitting: Radiology

## 2014-09-01 DIAGNOSIS — L02211 Cutaneous abscess of abdominal wall: Secondary | ICD-10-CM

## 2014-09-03 ENCOUNTER — Other Ambulatory Visit: Payer: BLUE CROSS/BLUE SHIELD

## 2014-09-03 ENCOUNTER — Telehealth (INDEPENDENT_AMBULATORY_CARE_PROVIDER_SITE_OTHER): Payer: Self-pay | Admitting: *Deleted

## 2014-09-03 NOTE — Telephone Encounter (Signed)
Estill Bamberg called stating Alarik was in the hospital for a UTI and ended up being an abscess, hematoma in the able area. Eunice Blase that Dr. Olevia Perches scheduled was full but please listen out for Reba or Ann's call. They will be watching the schedule for next week to work [employer] in. There return phone number is [phone].

## 2014-09-03 NOTE — Telephone Encounter (Signed)
Apt has been scheduled for Monday, 09/08/14 at 3 pm with Dr. Laural Golden.

## 2014-09-04 ENCOUNTER — Ambulatory Visit
Admission: RE | Admit: 2014-09-04 | Discharge: 2014-09-04 | Disposition: A | Discharge status: Home / Self Care | Payer: BLUE CROSS/BLUE SHIELD | Source: Ambulatory Visit | Attending: General Surgery | Admitting: General Surgery

## 2014-09-04 ENCOUNTER — Ambulatory Visit
Admission: RE | Admit: 2014-09-04 | Discharge: 2014-09-04 | Disposition: A | Discharge status: Home / Self Care | Payer: BLUE CROSS/BLUE SHIELD | Source: Ambulatory Visit | Attending: Radiology | Admitting: Radiology

## 2014-09-04 ENCOUNTER — Encounter: Payer: Self-pay | Admitting: Radiology

## 2014-09-04 ENCOUNTER — Encounter: Payer: Self-pay | Admitting: *Deleted

## 2014-09-04 DIAGNOSIS — L02211 Cutaneous abscess of abdominal wall: Secondary | ICD-10-CM | POA: Insufficient documentation

## 2014-09-04 NOTE — Progress Notes (Signed)
Chief Complaint: Patient was seen in consultation today for  Chief Complaint  Patient presents with  . Follow-up    Drain follow up     at the request of Morgan,[name]  Referring Physician(s): Morgan,[name]  History of Present Illness: Connor Small is a 54 y.o. male with an abscess in the left transversalis musculature which extended up and over the spleen. He underwent placement of a percutaneous drainage catheter on 08/27/2014. He has been on continuous antibiotics since that time. His last dose is tomorrow.  Currently he feels very well. He has no pain, fever or malaise. He is flushing approximately 7 mL of saline into the drain daily and collecting approximately 10 mL of serosanguineous drainage. This equates to a less than 5 mL output daily.  Past Medical History  Diagnosis Date  . Chronic diarrhea   . Rectal bleed   . Hemorrhoids   . Ulcerative colitis followed by dr Laural Golden    Distal UC dx 2004  . History of stroke March 2014 --  residual right weaker than prior to cva, unable to make fist    Bilateral cerebral and right cerebellar infarcts - felt to be embolic vs hypotensive (in setting of septic shock, transient a-fib, ARDS)  . History of DVT (deep vein thrombosis)     2014--  right upper extremity  . 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 History of acute renal failure     post-op  total colectomy 02-27-2012--  required dialysis--  resolved  . History of ARDS     02-27-2012  post op total colectomy -- septic shock-  required intubation/ventilator for 3-4 wks  . History of pleural effusion     acute- bilateral in setting septic shock post op Feb 2014  . History of Clostridium difficile colitis     02-27-2012--  fulminant C.Diff colitis caused toxic megacolon--  s/p  total colectomy  . Renal calculi     bilateral--  left > right  . History of  atrial fibrillation     02/ 2014--  no issues since  . Urgency of urination   . HAP (hospital-acquired pneumonia) 04/21/2012  . Clostridium difficile colitis 04/26/2012  . Aspiration pneumonitis 04/26/2012  . Allergic rhinitis 06/28/2012  . Ascites s/p US guide paracentesis (4/2) 04/25/2012  . PAF (paroxysmal atrial fibrillation) 03/15/2012  . 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 Colonoscopy  02/16/2011    Procedure: COLONOSCOPY;  Surgeon: Rogene Houston, MD;  [facility];  Service: Endoscopy;  Laterality: N/A;  100  . Flexible sigmoidoscopy  02/27/2012    Procedure: FLEXIBLE SIGMOIDOSCOPY;  Surgeon: Rogene Houston, MD;  [facility];  Service: Endoscopy;  Laterality: N/A;  with colonic decompression  . Laparotomy  02/27/2012    Procedure: EXPLORATORY LAPAROTOMY;  Surgeon: Donato Heinz, MD;  Location: [facility];  Service: General;  Laterality: N/A;  . Cecostomy  02/27/2012    Procedure: CECOSTOMY;  Surgeon: Donato Heinz, MD;  Location: [facility];  Service: General;  Laterality: N/A;  Cecostomy Tube Placement  . Colectomy  02/28/2012    Procedure: TOTAL COLECTOMY;  Surgeon: Donato Heinz, MD;  Location: [facility];  Service: General;  Laterality: N/A;  . Ileostomy  02/28/2012  Procedure: ILEOSTOMY;  Surgeon: Donato Heinz, MD;  Location: [facility];  Service: General;  Laterality: N/A;  . Cystoscopy w/ ureteral stent placement Bilateral 03/20/2012    Procedure: CYSTOSCOPY WITH RETROGRADE PYELOGRAM/;  Surgeon: Alexis Frock, MD;  Location: [place];  Service: Urology;  Laterality: Bilateral. Pt reports no stents were placed.  . Laparotomy N/A 03/26/2012    Procedure: EXPLORATORY LAPAROTOMY;  Surgeon: Edward Jolly, MD;  Location: [place];  Service: General;  Laterality: N/A;  . Application of wound vac N/A 03/26/2012    Procedure: APPLICATION OF WOUND VAC;  Surgeon: Edward Jolly, MD;  [facility];  Service:  General;  Laterality: N/A;  . Liver biopsy N/A 03/26/2012    Procedure: LIVER BIOPSY;  Surgeon: Edward Jolly, MD;  Location: [place];  Service: General;  Laterality: N/A;  . Laparotomy N/A 03/29/2012    Procedure: EXPLORATORY LAPAROTOMY, PARTIAL WOUND CLOSURE;  Surgeon: Edward Jolly, MD;  Location: [place];  Service: General;  Laterality: N/A;  . Vacuum assisted closure change N/A 03/29/2012    Procedure: Open ABDOMINAL VACUUM  CHANGE;  Surgeon: Edward Jolly, MD;  Location: [place];  Service: General;  Laterality: N/A;  . Vacuum assisted closure change N/A 04/01/2012    Procedure: removal of abdominal vac dressing and abdominal closure;  Surgeon: Edward Jolly, MD;  Location: [place];  Service: General;  Laterality: N/A;  . Flexible sigmoidoscopy N/A 02/06/2013    Procedure: FLEXIBLE SIGMOIDOSCOPY;  Surgeon: Rogene Houston, MD;  [facility];  Service: Endoscopy;  Laterality: N/A;  100-moved to 1200 Ann notified pt  . Flexible sigmoidoscopy N/A 03/27/2013    Procedure: FLEXIBLE SIGMOIDOSCOPY;  Surgeon: Rogene Houston, MD;  [facility];  Service: Endoscopy;  Laterality: N/A;  225  . Ileostomy closure N/A 05/27/2013    Procedure: ILEOSTOMY TAKEDOWN WITH ILEOPROCTOSTOMY;  Surgeon: Edward Jolly, MD;  Location: [facility];  Service: General;  Laterality: N/A;  . Flexible sigmoidoscopy N/A 12/11/2013    Procedure: FLEXIBLE SIGMOIDOSCOPY;  Surgeon: Rogene Houston, MD;  [facility];  Service: Endoscopy;  Laterality: N/A;  200  . Cystoscopy with retrograde pyelogram, ureteroscopy and stent placement Left 07/16/2014    Procedure: CYSTOSCOPY WITH RETROGRADE PYELOGRAM,  AND LEFT STENT PLACEMENT;  Surgeon: Alexis Frock, MD;  Location: [facility];  Service: Urology;  Laterality: Left;  . Sigmoid colectomy /  low anterior resection/  end sigmoid colostomy  11-07-2010   [facility]    Sigmoid colonic perferation---  TAKE DOWN COLOSTOMY,  Jan 2013  . Transthoracic  echocardiogram  03-26-2012  (in setting of septic shock)    report states  poorly visualized--  normal LVSF, ef 50-55% and no pericardial effusion  . Cystoscopy with retrograde pyelogram, ureteroscopy and stent placement Bilateral 07/25/2014    Procedure: CYSTOSCOPY WITH RETROGRADE PYELOGRAM, URETEROSCOPY AND BILATERAL STENT PLACEMENT;  Surgeon: Alexis Frock, MD;  Location: [facility];  Service: Urology;  Laterality: Bilateral;  . Holmium laser application Left 09/29/930    Procedure: HOLMIUM LASER APPLICATION, LEFT ONLY ;  Surgeon: Alexis Frock, MD;  Location: [facility];  Service: Urology;  Laterality: Left;    Allergies: Penicillins; Clindamycin/lincomycin; Morphine and related; Claritin; Contrast media; and Primaxin  Medications: Prior to Admission medications   Medication Sig Start Date End Date Taking? Authorizing Provider  aspirin EC 81 MG tablet Take 81 mg by mouth daily.   Yes Historical Provider, MD  diphenoxylate-atropine (  LOMOTIL) 2.5-0.025 MG per tablet Take 1 tablet by mouth 4 (four) times daily. Patient taking differently: Take 1 tablet by mouth 3 (three) times daily.  05/12/14  Yes Rogene Houston, MD  levofloxacin (LEVAQUIN) 500 MG tablet Take 1 tablet (500 mg total) by mouth daily. 08/29/14  Yes Kelvin Cellar, MD  mesalamine (CANASA) 1000 MG suppository Place 1 suppository (1,000 mg total) rectally at bedtime. 05/19/14  Yes Rogene Houston, MD  mesalamine (PENTASA) 500 MG CR capsule Take 2 capsules (1,000 mg total) by mouth 2 (two) times daily. Patient takes 2 by mouth twice daily 03/18/14  Yes Rogene Houston, MD  saccharomyces boulardii (FLORASTOR) 250 MG capsule Take 250 mg by mouth 2 (two) times daily.    Yes Historical Provider, MD  acetaminophen (TYLENOL) 500 MG tablet Take 1,000 mg by mouth every 6 (six) hours as needed for fever.    Historical Provider, MD  oxyCODONE-acetaminophen (PERCOCET/ROXICET) 5-325 MG per tablet Take 1 tablet by  mouth every 4 (four) hours as needed for moderate pain or severe pain. Patient not taking: Reported on 09/04/2014 07/25/14   Alexis Frock, MD  promethazine (PHENERGAN) 12.5 MG tablet Take 1 tablet (12.5 mg total) by mouth every 6 (six) hours as needed for nausea or vomiting. Patient not taking: Reported on 09/04/2014 07/25/14   Alexis Frock, MD     Family History  Problem Relation Age of Onset  . Cancer Sister   . Healthy Daughter   . Hypertension Mother   . Colon cancer Neg Hx     Social History   Social History  . Marital Status: Married    Spouse Name: Estill Bamberg  . Number of Children: 1  . Years of Education: 12th   Occupational History  . [employer]   Social History Main Topics  . Smoking status: Former Smoker -- 0.50 packs/day for 12 years    Types: Cigarettes    Quit date: 07/17/1984  . Smokeless tobacco: Never Used  . Alcohol Use: No  . Drug Use: No  . Sexual Activity: Not on file   Other Topics Concern  . Not on file   Social History Narrative    Review of Systems: A 12 point ROS discussed and pertinent positives are indicated in the HPI above.  All other systems are negative.  Review of Systems  Vital Signs: BP 105/60 mmHg  Pulse 63  Temp(Src) 97.3 F (36.3 C) (Oral)  Resp 15  SpO2 100%  Physical Exam  Constitutional: He appears well-developed and well-nourished.  Abdominal:    Drain site clean, dry and intact. No evidence of cellulitis or superficial abscess.  Nursing note and vitals reviewed.    Imaging: X-ray Chest Pa And Lateral  08/27/2014   CLINICAL DATA:  Acute onset of fever. Preoperative chest radiograph for abdominal surgery. Initial encounter.  EXAM: CHEST  2 VIEW  COMPARISON:  Chest radiograph performed 07/18/2014  FINDINGS: The lungs are well-aerated. Chronic left-sided pleural parenchymal scarring is unchanged in appearance. There is no evidence of focal opacification, pleural effusion or pneumothorax.  The heart is normal in  size; the mediastinal contour is within normal limits. No acute osseous abnormalities are seen.  IMPRESSION: No acute cardiopulmonary process seen. Chronic left-sided pleural parenchymal scarring is unchanged in appearance.   Electronically Signed   By: Garald Balding M.D.   On: 08/27/2014 02:21   Ct Image Guided Drainage Percut Cath  Peritoneal Retroperit  08/27/2014   CLINICAL DATA:  54 year old male with  enlarging fluid collection along the preperitoneal aspect of the left antral lateral abdominal wall accompanied by fever and leukocytosis. He presents for aspiration and possible drain placement.  EXAM: CT CORE BIOPSY RENAL  Date: 08/27/2014  PROCEDURE: 1. CT-guided drain placement Interventional Radiologist:  Criselda Peaches, MD  ANESTHESIA/SEDATION: Moderate (conscious) sedation was used. 2.5 mg Versed, 100 mcg Fentanyl were administered intravenously. The patient's vital signs were monitored continuously by radiology nursing throughout the procedure.  Sedation Time: 21 minutes  MEDICATIONS: None additional  TECHNIQUE: Informed consent was obtained from the patient following explanation of the procedure, risks, benefits and alternatives. The patient understands, agrees and consents for the procedure. All questions were addressed. A time out was performed.  A planning axial CT scan was performed. Fluid collection in the region of the left transversus abdominis muscle and in the perisplenic space was successfully identified. A suitable skin entry site was selected and marked. The region was sterilely prepped and draped in standard fashion using Betadine skin prep. Local anesthesia was attained by infiltration with 1% lidocaine. A small dermatotomy was made. Under CT guidance, an 18 gauge trocar needle was advanced into the inferior aspect of the fluid collection. A 0.018 inch wire was then advanced superiorly toward the spleen. The skin tract was dilated to 10 [place]. A Cook 10 [place] multipurpose drainage  catheter modified with additional sideholes was then advanced over the wire and formed. Aspiration yields approximately 80 mL of thick pearlescent bloody fluid. There appears to be purulent material mixed with the blood.  The collection was irrigated with 15 mL sterile saline which was re- aspirated. The drainage catheter was then connected to JP bulb suction. The drain was secured to the skin with 0 Prolene suture and an adhesive fixation device. The patient tolerated the procedure well.  COMPLICATIONS: None  Estimated blood loss:  0  IMPRESSION: Successful placement of a 10 [place] drainage catheter in the left transversus abdominis muscle fluid collection. Aspiration year old 80 mL of purulent bloody material. Clinically, the aspirated material is concerning for superinfection of a postoperative hematoma.  A sample was sent for Gram stain and culture. The drainage tube is left to JP bulb suction.  Signed,  Criselda Peaches, MD  [facility]  [facility]   Electronically Signed   By: Jacqulynn Cadet M.D.   On: 08/27/2014 17:03    Labs:  CBC:  Recent Labs  08/26/14 1849 08/27/14 0455 08/28/14 1120 08/29/14 0400  WBC 19.2* 12.6* 5.5 6.4  HGB 10.8* 9.6* 9.5* 9.6*  HCT 32.4* 29.0* 28.7* 28.6*  PLT 304 248 262 305    COAGS:  Recent Labs  08/26/14 1821  INR 1.13    BMP:  Recent Labs  08/26/14 1849 08/27/14 0455 08/28/14 1120 08/29/14 0400  NA 133* 135 135 138  K 3.8 3.9 4.3 4.4  CL 100* 105 104 107  CO2 23 24 24 23   GLUCOSE 108* 105* 94 103*  BUN 17 15 19  22*  CALCIUM 8.3* 8.2* 8.0* 8.4*  CREATININE 1.48* 1.39* 1.31* 1.44*  GFRNONAA 51* 55* 59* 53*  GFRAA 59* >60 >60 >60    LIVER FUNCTION TESTS:  Recent Labs  06/02/14 1517 08/26/14 1849 08/27/14 0455  BILITOT 0.2 0.7 0.7  AST 13 17 13*  ALT 11 13* 11*  ALKPHOS 69 78 59  PROT 5.7* 7.5 6.4*  ALBUMIN 3.3* 3.4* 2.8*    TUMOR MARKERS: No results for input(s):  AFPTM, CEA, CA199, CHROMGRNA  in the last 8760 hours.  Assessment and Plan:  54 year old male with a history of left anterior abdominal wall abscess. He feels 100% better. Imaging evaluation today demonstrates no significant residual abscess collection. He has 1 additional dose of antibiotic therapy.  1.) The drain was removed.    SignedJacqulynn Cadet 09/04/2014, 4:50 PM   I spent a total of  10 Minutes in face to face in clinical consultation, greater than 50% of which was counseling/coordinating care for left lower extremity abdominal wall abscess.

## 2014-09-08 ENCOUNTER — Ambulatory Visit (INDEPENDENT_AMBULATORY_CARE_PROVIDER_SITE_OTHER): Payer: BLUE CROSS/BLUE SHIELD | Admitting: Internal Medicine

## 2014-09-08 ENCOUNTER — Encounter (INDEPENDENT_AMBULATORY_CARE_PROVIDER_SITE_OTHER): Payer: Self-pay | Admitting: Internal Medicine

## 2014-09-08 VITALS — BP 100/70 | HR 66 | Temp 98.6°F | Resp 18 | Ht 70.0 in | Wt 159.1 lb

## 2014-09-08 DIAGNOSIS — K8020 Calculus of gallbladder without cholecystitis without obstruction: Secondary | ICD-10-CM

## 2014-09-08 DIAGNOSIS — K432 Incisional hernia without obstruction or gangrene: Secondary | ICD-10-CM

## 2014-09-08 DIAGNOSIS — K5120 Ulcerative (chronic) proctitis without complications: Secondary | ICD-10-CM

## 2014-09-08 DIAGNOSIS — K529 Noninfective gastroenteritis and colitis, unspecified: Secondary | ICD-10-CM

## 2014-09-08 DIAGNOSIS — R197 Diarrhea, unspecified: Secondary | ICD-10-CM

## 2014-09-08 MED ORDER — DIPHENOXYLATE-ATROPINE 2.5-0.025 MG PO TABS: 1.0000 | ORAL_TABLET | Freq: Four times a day (QID) | ORAL | Status: DC

## 2014-09-08 NOTE — Patient Instructions (Signed)
Call if diarrhea gets worse

## 2014-09-08 NOTE — Progress Notes (Signed)
Presenting complaint;  Chronic diarrhea and ulcerative proctitis.  Subjective:  Connor Small is 54 year old Caucasian male who is here for scheduled visit accompanied by his wife Estill Bamberg. He was last seen on 05/12/2014. He states he was doing better and was having less frequent stools but then on June 10 he developed abdominal pain and fever and was diagnosed with urolithiasis. He was admitted on 07/16/2014 and underwent cystoscopy with stent placement. On 07/25/2014 he underwent lithotripsy and stents were removed on 08/08/2014. He continued to have intermittent fever. CT scan on 08/26/2014 revealed area fluid collection and left upper abdomen. He had percutaneous drain on 08/27/2014 and 80 mL was removed. It was felt to be an abscess. He was treated with anti-biotics. Drain was removed on 09/04/2014. He feels much better. He is not having any abdominal pain fever or chills. Stool frequency has decreased. He is not having 5-7 stools per day. Now he is able to sleep most nights without having to get up to have a bowel movement. He is taking 4 tablets of Lomotil every day. His appetite drop but it is getting better now that he is not on anti-biotics. He denies melena or rectal bleeding. He wonders if he could back off on Canasa suppositories. He has lost 5 pounds since his last visit but he believes he has gained couple of pounds since recent hospitalization.   Current Medications: Outpatient Encounter Prescriptions as of 09/08/2014  Medication Sig  . aspirin EC 81 MG tablet Take 81 mg by mouth daily.  . diphenoxylate-atropine (LOMOTIL) 2.5-0.025 MG per tablet Take 1 tablet by mouth 4 (four) times daily. (Patient taking differently: Take 1 tablet by mouth 3 (three) times daily. )  . mesalamine (CANASA) 1000 MG suppository Place 1 suppository (1,000 mg total) rectally at bedtime.  . mesalamine (PENTASA) 500 MG CR capsule Take 2 capsules (1,000 mg total) by mouth 2 (two) times daily. Patient takes 2 by mouth  twice daily  . saccharomyces boulardii (FLORASTOR) 250 MG capsule Take 250 mg by mouth 2 (two) times daily.   . [DISCONTINUED] acetaminophen (TYLENOL) 500 MG tablet Take 1,000 mg by mouth every 6 (six) hours as needed for fever.  . [DISCONTINUED] levofloxacin (LEVAQUIN) 500 MG tablet Take 1 tablet (500 mg total) by mouth daily. (Patient not taking: Reported on 09/08/2014)  . [DISCONTINUED] oxyCODONE-acetaminophen (PERCOCET/ROXICET) 5-325 MG per tablet Take 1 tablet by mouth every 4 (four) hours as needed for moderate pain or severe pain. (Patient not taking: Reported on 09/04/2014)  . [DISCONTINUED] promethazine (PHENERGAN) 12.5 MG tablet Take 1 tablet (12.5 mg total) by mouth every 6 (six) hours as needed for nausea or vomiting. (Patient not taking: Reported on 09/04/2014)   No facility-administered encounter medications on file as of 09/08/2014.     Objective: Blood pressure 100/70, pulse 66, temperature 98.6 F (37 C), temperature source Oral, resp. rate 18, height 5' 10"  (1.778 m), weight 159 lb 1.6 oz (72.167 kg). Patient is alert and in no acute distress. Conjunctiva is pink. Sclera is nonicteric Oropharyngeal mucosa is normal. No neck masses or thyromegaly noted. Cardiac exam with regular rhythm normal S1 and S2. No murmur or gallop noted. Lungs are clear to auscultation. Abdomen is flat. He has Band-Aid covering site were drain was placed and subsequently removed. Skin is erythematous but no drainage noted. He has incisional hernia in lower midabdomen. Abdomen is otherwise soft and nontender without organomegaly or masses. No LE edema or clubbing noted.  Labs/studies Results:   Abscess cultures were  negative. Serum creatinine 1.44 on 08/29/2014. Serum creatinine 2.05 on 07/19/2014 Recent CT reviewed and calcified gallstone noted.  Assessment:  #1. Chronic diarrhea secondary to loss of most of his colon. He only has rectum in situ connected to small bowel. Stool frequency has  decreased which is secondary to intestinal dilatation and diphenoxylate. #2. Ulcerative proctitis. Given his clinical course he needs to continue both oral and rectal mesalamine. #3. Asymptomatic cholelithiasis which is a new diagnosis. #4. Incisional hernia which is again new diagnosis. He is asymptomatic. #5. Elevated serum creatinine secondary to recent urolithiasis and UTI. He is to have blood work when he sees Dr. Tresa Moore later this month.   Plan:  New prescription given for diphenoxylate 1 tablet by mouth 4 times a day for one month with 5 refills. Patient instructed to continue Pentasa and Canasa suppositories. Patient to have metabolic 7 when he sees Dr. Tresa Moore later this month. Office visit in 3 months.

## 2014-09-17 LAB — BLOOD GAS, ARTERIAL
Acid-base deficit: 4.7 mmol/L — ABNORMAL HIGH (ref 0.0–2.0)
Bicarbonate: 20.2 mEq/L (ref 20.0–24.0)
FIO2: 70
MECHVT: 440 mL
O2 Saturation: 96.8 %
PEEP: 12 cmH2O
Patient temperature: 37
RATE: 24 resp/min
TCO2: 18.7 mmol/L (ref 0–100)
pCO2 arterial: 39.8 mmHg (ref 35.0–45.0)
pH, Arterial: 7.327 — ABNORMAL LOW (ref 7.350–7.450)
pO2, Arterial: 77.6 mmHg — ABNORMAL LOW (ref 80.0–100.0)

## 2014-09-17 LAB — CARBOXYHEMOGLOBIN

## 2014-09-30 ENCOUNTER — Ambulatory Visit (INDEPENDENT_AMBULATORY_CARE_PROVIDER_SITE_OTHER): Payer: BLUE CROSS/BLUE SHIELD | Admitting: Internal Medicine

## 2014-10-21 ENCOUNTER — Ambulatory Visit (INDEPENDENT_AMBULATORY_CARE_PROVIDER_SITE_OTHER): Payer: BLUE CROSS/BLUE SHIELD | Admitting: Internal Medicine

## 2014-12-23 ENCOUNTER — Ambulatory Visit (INDEPENDENT_AMBULATORY_CARE_PROVIDER_SITE_OTHER): Payer: BLUE CROSS/BLUE SHIELD | Admitting: Internal Medicine

## 2014-12-23 ENCOUNTER — Encounter (INDEPENDENT_AMBULATORY_CARE_PROVIDER_SITE_OTHER): Payer: Self-pay | Admitting: Internal Medicine

## 2014-12-23 VITALS — BP 100/68 | HR 82 | Temp 98.0°F | Resp 18 | Ht 70.0 in | Wt 166.4 lb

## 2014-12-23 DIAGNOSIS — K5120 Ulcerative (chronic) proctitis without complications: Secondary | ICD-10-CM

## 2014-12-23 DIAGNOSIS — K529 Noninfective gastroenteritis and colitis, unspecified: Secondary | ICD-10-CM | POA: Diagnosis not present

## 2014-12-23 MED ORDER — LOPERAMIDE HCL 2 MG PO CAPS: 2.0000 mg | ORAL_CAPSULE | Freq: Every day | ORAL | Status: DC

## 2014-12-23 NOTE — Progress Notes (Signed)
Presenting complaint;  Follow-up for ulcerative proctitis and diarrhea.  Subjective:  Connor Small is 54 year old Caucasian male who is here for scheduled visit. He was last seen on 09/08/2014. He is accompanied by his wife today. He is having 7-9 bowel movements every day. 2 of these bowel movements occur in the middle of night. Stool consistency has improved but it is not formed. He has occasional hematochezia when he strains. He knows that he has hemorrhoids. He wants to know if he quit, off Pentasa. He passes medication granules often. He also wants to know if ulcerative proctitis has healed. He has good appetite. He has gained 7 pounds since his last visit. He is working full-time. He has seen Dr. Mikael Spray for incisional hernia. Surgery has been delayed since he had intra-abdominal abscess back in August this year.    Current Medications: Outpatient Encounter Prescriptions as of 12/23/2014  Medication Sig  . aspirin EC 81 MG tablet Take 81 mg by mouth daily.  . diphenoxylate-atropine (LOMOTIL) 2.5-0.025 MG per tablet Take 1 tablet by mouth 4 (four) times daily.  . mesalamine (CANASA) 1000 MG suppository Place 1 suppository (1,000 mg total) rectally at bedtime.  . mesalamine (PENTASA) 500 MG CR capsule Take 2 capsules (1,000 mg total) by mouth 2 (two) times daily. Patient takes 2 by mouth twice daily  . Multiple Vitamin (MULTIVITAMIN) LIQD Take 5 mLs by mouth daily.  Marland Kitchen saccharomyces boulardii (FLORASTOR) 250 MG capsule Take 250 mg by mouth daily.    No facility-administered encounter medications on file as of 12/23/2014.     Objective: Blood pressure 100/68, pulse 82, temperature 98 F (36.7 C), temperature source Oral, resp. rate 18, height 5' 10"  (1.778 m), weight 166 lb 6.4 oz (75.479 kg). Patient is alert and in no acute distress. Conjunctiva is pink. Sclera is nonicteric Oropharyngeal mucosa is normal. No neck masses or thyromegaly noted. Cardiac exam with regular rhythm normal  S1 and S2. No murmur or gallop noted. Lungs are clear to auscultation. Abdomen abdomen is symmetrical. Bowel sounds are normal. He has lower midline scar with incisional hernia and he also has horizontal scars and left and right low quadrant. Abdomen is soft and nontender without organomegaly or masses. No LE edema or clubbing noted. Persistent deformity right hand with limited flexion to most of the fingers.   Assessment:  #1. Ulcerative proctitis. He only has rectum remaining as rest of the colon was removed for fulminant C. difficile colitis in February 2014. He also has history of anastomotic stricture and colonic perforation. Therefore it is important to document healing of ulcerative proctitis and to make sure he does not have ileorectal anastomotic stricture. #2. Chronic diarrhea secondary to loss of colonic reservoir resulting from subtotal colectomy.    Plan:  Continue diphenoxylate 2 tablets by mouth twice a day. Loperamide OTC 2 mg by mouth daily at bedtime. Diagnostic flexible sigmoidoscopy/proctoscopy to document healing of proctitis. Office visit in 4 months.

## 2014-12-23 NOTE — Patient Instructions (Signed)
Flexible sigmoidoscopy/proctoscopy to be scheduled.

## 2014-12-24 ENCOUNTER — Other Ambulatory Visit (INDEPENDENT_AMBULATORY_CARE_PROVIDER_SITE_OTHER): Payer: Self-pay | Admitting: *Deleted

## 2014-12-24 ENCOUNTER — Encounter (INDEPENDENT_AMBULATORY_CARE_PROVIDER_SITE_OTHER): Payer: Self-pay | Admitting: *Deleted

## 2014-12-24 DIAGNOSIS — K51218 Ulcerative (chronic) proctitis with other complication: Secondary | ICD-10-CM

## 2014-12-25 ENCOUNTER — Encounter (INDEPENDENT_AMBULATORY_CARE_PROVIDER_SITE_OTHER): Payer: Self-pay | Admitting: Internal Medicine

## 2015-01-05 MED ORDER — SODIUM CHLORIDE 0.9 % IJ SOLN
INTRAMUSCULAR | Status: AC
  Filled 2015-01-05: qty 750

## 2015-01-07 ENCOUNTER — Encounter (HOSPITAL_COMMUNITY)
Admission: RE | Disposition: A | Discharge status: Home / Self Care | Payer: Self-pay | Source: Ambulatory Visit | Attending: Internal Medicine

## 2015-01-07 ENCOUNTER — Encounter (HOSPITAL_COMMUNITY): Payer: Self-pay | Admitting: *Deleted

## 2015-01-07 ENCOUNTER — Ambulatory Visit (HOSPITAL_COMMUNITY)
Admission: RE | Admit: 2015-01-07 | Discharge: 2015-01-07 | Disposition: A | Discharge status: Home / Self Care | Payer: BLUE CROSS/BLUE SHIELD | Source: Ambulatory Visit | Attending: Internal Medicine | Admitting: Internal Medicine

## 2015-01-07 DIAGNOSIS — Z7982 Long term (current) use of aspirin: Secondary | ICD-10-CM | POA: Insufficient documentation

## 2015-01-07 DIAGNOSIS — Z79899 Other long term (current) drug therapy: Secondary | ICD-10-CM | POA: Diagnosis not present

## 2015-01-07 DIAGNOSIS — K5120 Ulcerative (chronic) proctitis without complications: Secondary | ICD-10-CM | POA: Diagnosis not present

## 2015-01-07 DIAGNOSIS — Z86718 Personal history of other venous thrombosis and embolism: Secondary | ICD-10-CM | POA: Diagnosis not present

## 2015-01-07 DIAGNOSIS — K921 Melena: Secondary | ICD-10-CM | POA: Insufficient documentation

## 2015-01-07 DIAGNOSIS — K529 Noninfective gastroenteritis and colitis, unspecified: Secondary | ICD-10-CM | POA: Diagnosis not present

## 2015-01-07 DIAGNOSIS — R197 Diarrhea, unspecified: Secondary | ICD-10-CM | POA: Insufficient documentation

## 2015-01-07 DIAGNOSIS — Z9049 Acquired absence of other specified parts of digestive tract: Secondary | ICD-10-CM | POA: Diagnosis not present

## 2015-01-07 DIAGNOSIS — K5669 Other intestinal obstruction: Secondary | ICD-10-CM | POA: Diagnosis not present

## 2015-01-07 DIAGNOSIS — K51218 Ulcerative (chronic) proctitis with other complication: Secondary | ICD-10-CM | POA: Diagnosis not present

## 2015-01-07 DIAGNOSIS — Z87891 Personal history of nicotine dependence: Secondary | ICD-10-CM | POA: Insufficient documentation

## 2015-01-07 HISTORY — PX: FLEXIBLE SIGMOIDOSCOPY: SHX5431

## 2015-01-07 SURGERY — SIGMOIDOSCOPY, FLEXIBLE
Anesthesia: Moderate Sedation

## 2015-01-07 MED ORDER — SODIUM CHLORIDE 0.9 % IV SOLN
INTRAVENOUS | Status: DC
  Administered 2015-01-07: 08:00:00 via INTRAVENOUS

## 2015-01-07 MED ORDER — STERILE WATER FOR IRRIGATION IR SOLN
Status: DC | PRN
  Administered 2015-01-07: 08:00:00

## 2015-01-07 MED ORDER — MIDAZOLAM HCL 5 MG/5ML IJ SOLN
INTRAMUSCULAR | Status: AC
  Filled 2015-01-07: qty 10

## 2015-01-07 MED ORDER — HYDROCORTISONE 100 MG/60ML RE ENEM: 1.0000 | ENEMA | Freq: Every day | RECTAL | Status: DC

## 2015-01-07 MED ORDER — MEPERIDINE HCL 25 MG/ML IJ SOLN
INTRAMUSCULAR | Status: DC | PRN
  Administered 2015-01-07 (×2): 25 mg via INTRAVENOUS

## 2015-01-07 MED ORDER — MIDAZOLAM HCL 5 MG/5ML IJ SOLN
INTRAMUSCULAR | Status: DC | PRN
  Administered 2015-01-07 (×2): 2 mg via INTRAVENOUS
  Administered 2015-01-07: 1 mg via INTRAVENOUS
  Administered 2015-01-07: 2 mg via INTRAVENOUS

## 2015-01-07 MED ORDER — TUBERCULIN PPD 5 UNIT/0.1ML ID SOLN
5.0000 [IU] | Freq: Once | INTRADERMAL | Status: DC
  Administered 2015-01-07: 5 [IU] via INTRADERMAL
  Filled 2015-01-07: qty 0.1

## 2015-01-07 MED ORDER — MEPERIDINE HCL 50 MG/ML IJ SOLN
INTRAMUSCULAR | Status: AC
  Filled 2015-01-07: qty 1

## 2015-01-07 NOTE — H&P (Signed)
Connor Small is an 54 y.o. male.   Chief Complaint: Patient is here for flexible sigmoidoscopy/proctoscopy. HPI: Patient is 54 year old Caucasian male with complicated GI history who has ulcerative colitis and has had subtotal colectomy over 2 years ago for fulminant C. difficile colitis. He remains with frequent bowel movements. He is not having an average of 8 stools per day and he also has intermittent hematochezia usually in the form of blood in the tissue. He had sigmoidoscopy November last year and he still had active disease in the rectum. He is undergoing follow-up study to determine if he is in remission or not.  Past Medical History  Diagnosis Date  . Chronic diarrhea   . Rectal bleed   . Hemorrhoids   . Ulcerative colitis followed by dr Laural Golden    Distal UC dx 2004  . History of stroke March 2014 --  residual right weaker than prior to cva, unable to make fist    Bilateral cerebral and right cerebellar infarcts - felt to be embolic vs hypotensive (in setting of septic shock, transient a-fib, ARDS)  . History of DVT (deep vein thrombosis)     2014--  right upper extremity  . 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 History of acute renal failure     post-op  total colectomy 02-27-2012--  required dialysis--  resolved  . History of ARDS     02-27-2012  post op total colectomy -- septic shock-  required intubation/ventilator for 3-4 wks  . History of pleural effusion     acute- bilateral in setting septic shock post op Feb 2014  . History of Clostridium difficile colitis     02-27-2012--  fulminant C.Diff colitis caused toxic megacolon--  s/p  total colectomy  . Renal calculi     bilateral--  left > right  . History of atrial fibrillation     02/ 2014--  no issues since  . Urgency of urination   . HAP (hospital-acquired pneumonia) 04/21/2012  . Clostridium  difficile colitis 04/26/2012  . Aspiration pneumonitis ([place]) 04/26/2012  . Allergic rhinitis 06/28/2012  . Ascites s/p US guide paracentesis (4/2) 04/25/2012  . PAF (paroxysmal atrial fibrillation) ([place]) 03/15/2012  . 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 Colonoscopy  02/16/2011    Procedure: COLONOSCOPY;  Surgeon: Rogene Houston, MD;  [facility];  Service: Endoscopy;  Laterality: N/A;  100  . Flexible sigmoidoscopy  02/27/2012    Procedure: FLEXIBLE SIGMOIDOSCOPY;  Surgeon: Rogene Houston, MD;  [facility];  Service: Endoscopy;  Laterality: N/A;  with colonic decompression  . Laparotomy  02/27/2012    Procedure: EXPLORATORY LAPAROTOMY;  Surgeon: Donato Heinz, MD;  [facility]: [facility];  Service: General;  Laterality: N/A;  . Cecostomy  02/27/2012    Procedure: CECOSTOMY;  Surgeon: Donato Heinz, MD;  [facility];  Service: General;  Laterality: N/A;  Cecostomy Tube Placement  . Colectomy  02/28/2012    Procedure: TOTAL COLECTOMY;  Surgeon: Donato Heinz, MD;  [facility];  Service: General;  Laterality: N/A;  . Ileostomy  02/28/2012    Procedure: ILEOSTOMY;  Surgeon: Donato Heinz, MD;  [facility];  Service: General;  Laterality: N/A;  . Cystoscopy w/ ureteral stent placement Bilateral 03/20/2012  Procedure: CYSTOSCOPY WITH RETROGRADE PYELOGRAM/;  Surgeon: Alexis Frock, MD;  Location: [place];  Service: Urology;  Laterality: Bilateral. Pt reports no stents were placed.  . Laparotomy N/A 03/26/2012    Procedure: EXPLORATORY LAPAROTOMY;  Surgeon: Edward Jolly, MD;  Location: [place];  Service: General;  Laterality: N/A;  . Application of wound vac N/A 03/26/2012    Procedure: APPLICATION OF WOUND VAC;  Surgeon: Edward Jolly, MD;  [facility];  Service: General;  Laterality: N/A;  . Liver biopsy N/A 03/26/2012    Procedure: LIVER BIOPSY;  Surgeon: Edward Jolly, MD;  Location:  [place];  Service: General;  Laterality: N/A;  . Laparotomy N/A 03/29/2012    Procedure: EXPLORATORY LAPAROTOMY, PARTIAL WOUND CLOSURE;  Surgeon: Edward Jolly, MD;  Location: [place];  Service: General;  Laterality: N/A;  . Vacuum assisted closure change N/A 03/29/2012    Procedure: Open ABDOMINAL VACUUM  CHANGE;  Surgeon: Edward Jolly, MD;  Location: [place];  Service: General;  Laterality: N/A;  . Vacuum assisted closure change N/A 04/01/2012    Procedure: removal of abdominal vac dressing and abdominal closure;  Surgeon: Edward Jolly, MD;  Location: [place];  Service: General;  Laterality: N/A;  . Flexible sigmoidoscopy N/A 02/06/2013    Procedure: FLEXIBLE SIGMOIDOSCOPY;  Surgeon: Rogene Houston, MD;  [facility];  Service: Endoscopy;  Laterality: N/A;  100-moved to 1200 Ann notified pt  . Flexible sigmoidoscopy N/A 03/27/2013    Procedure: FLEXIBLE SIGMOIDOSCOPY;  Surgeon: Rogene Houston, MD;  [facility];  Service: Endoscopy;  Laterality: N/A;  225  . Ileostomy closure N/A 05/27/2013    Procedure: ILEOSTOMY TAKEDOWN WITH ILEOPROCTOSTOMY;  Surgeon: Edward Jolly, MD;  [facility];  Service: General;  Laterality: N/A;  . Flexible sigmoidoscopy N/A 12/11/2013    Procedure: FLEXIBLE SIGMOIDOSCOPY;  Surgeon: Rogene Houston, MD;  [facility];  Service: Endoscopy;  Laterality: N/A;  200  . Cystoscopy with retrograde pyelogram, ureteroscopy and stent placement Left 07/16/2014    Procedure: CYSTOSCOPY WITH RETROGRADE PYELOGRAM,  AND LEFT STENT PLACEMENT;  Surgeon: Alexis Frock, MD;  [facility];  Service: Urology;  Laterality: Left;  . Sigmoid colectomy /  low anterior resection/  end sigmoid colostomy  11-07-2010   [facility]    Sigmoid colonic perferation---  TAKE DOWN COLOSTOMY,  Jan 2013  . Transthoracic echocardiogram  03-26-2012  (in setting of septic shock)    report states  poorly visualized--  normal LVSF, ef 50-55% and no  pericardial effusion  . Cystoscopy with retrograde pyelogram, ureteroscopy and stent placement Bilateral 07/25/2014    Procedure: CYSTOSCOPY WITH RETROGRADE PYELOGRAM, URETEROSCOPY AND BILATERAL STENT PLACEMENT;  Surgeon: Alexis Frock, MD;  Location: [facility];  Service: Urology;  Laterality: Bilateral;  . Holmium laser application Left 08/28/2776    Procedure: HOLMIUM LASER APPLICATION, LEFT ONLY ;  Surgeon: Alexis Frock, MD;  Location: [facility];  Service: Urology;  Laterality: Left;    Family History  Problem Relation Age of Onset  . Cancer Sister   . Healthy Daughter   . Hypertension Mother   . Colon cancer Neg Hx    Social History:  reports that he quit smoking about 30 years ago. His smoking use included Cigarettes. He has a 6 pack-year smoking history. He has never used smokeless tobacco. He reports that he does not drink alcohol or use illicit drugs.  Allergies:  Allergies  Allergen Reactions  . Penicillins Other (See Comments)    "Heart rate slowed down"  bradycardia  . Clindamycin/Lincomycin Diarrhea    C-diff  . Morphine And Related Nausea And Vomiting  . Claritin [Loratadine] Rash  . Contrast Media [Iodinated Diagnostic Agents] Hives    Patient reports hives after IV contrast.   . Primaxin [Imipenem] Rash    Medications Prior to Admission  Medication Sig Dispense Refill  . aspirin EC 81 MG tablet Take 81 mg by mouth daily.    . diphenoxylate-atropine (LOMOTIL) 2.5-0.025 MG per tablet Take 1 tablet by mouth 4 (four) times daily. 120 tablet 5  . mesalamine (CANASA) 1000 MG suppository Place 1 suppository (1,000 mg total) rectally at bedtime. 30 suppository 11  . mesalamine (PENTASA) 500 MG CR capsule Take 2 capsules (1,000 mg total) by mouth 2 (two) times daily. Patient takes 2 by mouth twice daily 360 capsule 3  . Multiple Minerals-Vitamins (CITRACAL PLUS PO) Take 1 tablet by mouth 2 (two) times daily.    . Multiple Vitamin  (MULTIVITAMIN) LIQD Take 5 mLs by mouth daily.    Marland Kitchen saccharomyces boulardii (FLORASTOR) 250 MG capsule Take 250 mg by mouth daily.     Marland Kitchen loperamide (IMODIUM) 2 MG capsule Take 1 capsule (2 mg total) by mouth at bedtime.      No results found for this or any previous visit (from the past 48 hour(s)). No results found.  ROS  Blood pressure 114/68, pulse 65, temperature 98.2 F (36.8 C), temperature source Oral, resp. rate 17, height 5' 9"  (1.753 m), weight 168 lb (76.204 kg), SpO2 99 %. Physical Exam  Constitutional: He appears well-developed and well-nourished.  HENT:  Mouth/Throat: Oropharynx is clear and moist.  Eyes: Conjunctivae are normal. No scleral icterus.  Neck: No thyromegaly present.  Cardiovascular: Normal rate, regular rhythm and normal heart sounds.   No murmur heard. Respiratory: Effort normal and breath sounds normal.  GI:  Abdomen is symmetrical with midline scar with incisional hernia. He has small scar in left and right low quadrant of abdomen. Abdomen is soft and nontender without organomegaly or masses.  Musculoskeletal: He exhibits no edema.  Lymphadenopathy:    He has no cervical adenopathy.  Neurological: He is alert.  Skin: Skin is warm and dry.     Assessment/Plan Ulcerative proctitis. Diarrhea and hematochezia. Diagnostic flexible sigmoidoscopy/proctoscopy.  Christa Fasig U 01/07/2015, 8:06 AM

## 2015-01-07 NOTE — Op Note (Addendum)
FLEXIBLE SIGMOIDOSCOPY  PROCEDURE REPORT  PATIENT:  Connor Small  MR#:  [id number] Birthdate:  11-03-1960, 54 y.o., male Endoscopist:  Dr. Rogene Houston, MD  Procedure Date: 01/07/2015  Procedure:   Flexible sigmoidoscopy/proctoscopy.  Indications: Patient is 54 year old Caucasian male who has over 16 year history of ulcerative colitis status post subtotal colectomy for fulminant C. difficile colitis who has residual rectum. He was noted to have active disease in November last year. He remains with frequent bowel movements and hematochezia. He is undergoing diagnostic examination. He is on oral and rectal mesalamine.  Informed Consent:  The procedure and risks were reviewed with the patient and informed consent was obtained.  Medications:  Demerol 50 mg IV Versed 7 mg IV  Description of procedure:  After a digital rectal exam was performed, that colonoscope was advanced from the anus into the rectum. High grade ileorectal anastomotic stricture noted. Unable to advance pediatric course ultra Slim colonic scope. Mucosa of distal small bowel appeared to be normal. Scope was not retroflexed.  Findings:  Prep excellent. Diffuse involvement of rectal mucosa with erosions and ulcers. High grade ileorectal anastomotic stricture with inability to advance even ultra Slim colonoscope proximal to stricture. Multiple rectal biopsies taken.   Therapeutic/Diagnostic Maneuvers Performed:  See above  Complications:  None  EBL: Minimal  Impression:  Ulcerative proctitis more pronounced than on last exam of November 2015. High grade ileorectal anastomotic stricture. Limited view of distal small bowel revealing normal mucosa. Multiple rectal biopsies taken for routine histology.  Recommendations:  Standard instructions given. Will place PPD today. I will contact patient with biopsy results and further recommendations.  REHMAN,NAJEEB U  01/07/2015 8:40 AM  CC: Dr. Chesley Noon, MD &  Dr. Rayne Du ref. provider found

## 2015-01-07 NOTE — Discharge Instructions (Signed)
Resume usual medications and diet. Hydrocortisone enemas 1 per rectum daily at bedtime for 2 weeks. No driving for 24 hours. Dr. will call with biopsy results.  Gastrointestinal Endoscopy, Care After Refer to this sheet in the next few weeks. These instructions provide you with information on caring for yourself after your procedure. Your caregiver may also give you more specific instructions. Your treatment has been planned according to current medical practices, but problems sometimes occur. Call your caregiver if you have any problems or questions after your procedure. HOME CARE INSTRUCTIONS  If you were given medicine to help you relax (sedative), do not drive, operate machinery, or sign important documents for 24 hours.  Avoid alcohol and hot or warm beverages for the first 24 hours after the procedure.  Only take over-the-counter or prescription medicines for pain, discomfort, or fever as directed by your caregiver. You may resume taking your normal medicines unless your caregiver tells you otherwise. Ask your caregiver when you may resume taking medicines that may cause bleeding, such as aspirin, clopidogrel, or warfarin.  You may return to your normal diet and activities on the day after your procedure, or as directed by your caregiver. Walking may help to reduce any bloated feeling in your abdomen.  Drink enough fluids to keep your urine clear or pale yellow.  You may gargle with salt water if you have a sore throat. SEEK IMMEDIATE MEDICAL CARE IF:  You have severe nausea or vomiting.  You have severe abdominal pain, abdominal cramps that last longer than 6 hours, or abdominal swelling (distention).  You have severe shoulder or back pain.  You have trouble swallowing.  You have shortness of breath, your breathing is shallow, or you are breathing faster than normal.  You have a fever or a rapid heartbeat.  You vomit blood or material that looks like coffee grounds.  You  have bloody, black, or tarry stools. MAKE SURE YOU:  Understand these instructions.  Will watch your condition.  Will get help right away if you are not doing well or get worse.   This information is not intended to replace advice given to you by your health care provider. Make sure you discuss any questions you have with your health care provider.   Document Released: 08/25/2003 Document Revised: 01/31/2014 Document Reviewed: 04/12/2011 Elsevier Interactive Patient Education [employer].

## 2015-01-12 ENCOUNTER — Encounter (HOSPITAL_COMMUNITY): Payer: Self-pay | Admitting: Internal Medicine

## 2015-01-16 ENCOUNTER — Telehealth (INDEPENDENT_AMBULATORY_CARE_PROVIDER_SITE_OTHER): Payer: Self-pay | Admitting: Internal Medicine

## 2015-01-16 DIAGNOSIS — K51819 Other ulcerative colitis with unspecified complications: Secondary | ICD-10-CM

## 2015-01-16 NOTE — Telephone Encounter (Signed)
Patient states he is having much less pain and he has seen very small amount of blood one since he has been on hydrocortisone enemas. He has decided to be treated with Remicade infusion before considering surgery. Patient will need hepatitis B surface antigen. Will request Remicade for induction and maintenance as soon as hepatitis B surface antigen result available.

## 2015-01-20 NOTE — Telephone Encounter (Signed)
Hepatitis B Surface Antigen order complete. Will request Remicade for induction and maintenance as soon as these results are available.

## 2015-01-22 LAB — HEPATITIS B SURFACE ANTIGEN: Hepatitis B Surface Ag: NEGATIVE

## 2015-02-06 ENCOUNTER — Encounter (HOSPITAL_COMMUNITY)
Admission: RE | Admit: 2015-02-06 | Discharge: 2015-02-06 | Disposition: A | Discharge status: Home / Self Care | Payer: BLUE CROSS/BLUE SHIELD | Source: Ambulatory Visit | Attending: Internal Medicine | Admitting: Internal Medicine

## 2015-02-06 DIAGNOSIS — K51819 Other ulcerative colitis with unspecified complications: Secondary | ICD-10-CM | POA: Insufficient documentation

## 2015-02-06 MED ORDER — SODIUM CHLORIDE 0.9 % IV SOLN
5.0000 mg/kg | Freq: Once | INTRAVENOUS | Status: AC
  Administered 2015-02-06: 400 mg via INTRAVENOUS
  Filled 2015-02-06: qty 40

## 2015-02-06 MED ORDER — LORATADINE 10 MG PO TABS
ORAL_TABLET | ORAL | Status: AC
  Filled 2015-02-06: qty 1

## 2015-02-06 MED ORDER — SODIUM CHLORIDE 0.9 % IV SOLN
Freq: Once | INTRAVENOUS | Status: AC
  Administered 2015-02-06: 250 mL via INTRAVENOUS

## 2015-02-06 MED ORDER — ACETAMINOPHEN 325 MG PO TABS
650.0000 mg | ORAL_TABLET | Freq: Once | ORAL | Status: AC
  Administered 2015-02-06: 650 mg via ORAL
  Filled 2015-02-06: qty 2

## 2015-02-06 MED ORDER — DIPHENHYDRAMINE HCL 25 MG PO CAPS
50.0000 mg | ORAL_CAPSULE | Freq: Once | ORAL | Status: DC
  Filled 2015-02-06: qty 2

## 2015-02-06 MED ORDER — DIPHENHYDRAMINE HCL 25 MG PO CAPS
25.0000 mg | ORAL_CAPSULE | Freq: Once | ORAL | Status: AC
Start: 2015-02-06 — End: 2015-02-06
  Administered 2015-02-06: 25 mg via ORAL

## 2015-02-06 MED ORDER — LORATADINE 10 MG PO TABS: 10.0000 mg | ORAL_TABLET | Freq: Once | ORAL | Status: DC

## 2015-02-06 MED ORDER — DIPHENHYDRAMINE HCL 25 MG PO CAPS
ORAL_CAPSULE | ORAL | Status: AC
  Filled 2015-02-06: qty 1

## 2015-02-06 NOTE — Discharge Instructions (Signed)
Infliximab injection What is this medicine? INFLIXIMAB (in [place] i mab) is used to treat Crohn's disease and ulcerative colitis. It is also used to treat ankylosing spondylitis, psoriasis, and some forms of arthritis. This medicine may be used for other purposes; ask your health care provider or pharmacist if you have questions. What should I tell my health care provider before I take this medicine? They need to know if you have any of these conditions: -diabetes -exposure to tuberculosis -heart failure -hepatitis or liver disease -immune system problems -infection -lung or breathing disease, like COPD -multiple sclerosis -current or past resident of [place] or [place] -seizure disorder -an unusual or allergic reaction to infliximab, mouse proteins, other medicines, foods, dyes, or preservatives -pregnant or trying to get pregnant -breast-feeding How should I use this medicine? This medicine is for injection into a vein. It is usually given by a health care professional in a hospital or clinic setting. A special MedGuide will be given to you by the pharmacist with each prescription and refill. Be sure to read this information carefully each time. Talk to your pediatrician regarding the use of this medicine in children. Special care may be needed. Overdosage: If you think you have taken too much of this medicine contact a poison control center or emergency room at once. NOTE: This medicine is only for you. Do not share this medicine with others. What if I miss a dose? It is important not to miss your dose. Call your doctor or health care professional if you are unable to keep an appointment. What may interact with this medicine? Do not take this medicine with any of the following medications: -anakinra -rilonacept This medicine may also interact with the following medications: -vaccines This list may not describe all possible interactions. Give your health care provider  a list of all the medicines, herbs, non-prescription drugs, or dietary supplements you use. Also tell them if you smoke, drink alcohol, or use illegal drugs. Some items may interact with your medicine. What should I watch for while using this medicine? Visit your doctor or health care professional for regular checks on your progress. If you get a cold or other infection while receiving this medicine, call your doctor or health care professional. Do not treat yourself. This medicine may decrease your body's ability to fight infections. Before beginning therapy, your doctor may do a test to see if you have been exposed to tuberculosis. This medicine may make the symptoms of heart failure worse in some patients. If you notice symptoms such as increased shortness of breath or swelling of the ankles or legs, contact your health care provider right away. If you are going to have surgery or dental work, tell your health care professional or dentist that you have received this medicine. If you take this medicine for plaque psoriasis, stay out of the sun. If you cannot avoid being in the sun, wear protective clothing and use sunscreen. Do not use sun lamps or tanning beds/booths. What side effects may I notice from receiving this medicine? Side effects that you should report to your doctor or health care professional as soon as possible: -allergic reactions like skin rash, itching or hives, swelling of the face, lips, or tongue -chest pain -fever or chills, usually related to the infusion -muscle or joint pain -red, scaly patches or raised bumps on the skin -signs of infection - fever or chills, cough, sore throat, pain or difficulty passing urine -swollen lymph nodes in the neck, underarm,  or groin areas -unexplained weight loss -unusual bleeding or bruising -unusually weak or tired -yellowing of the eyes or skin Side effects that usually do not require medical attention (report to your doctor or health  care professional if they continue or are bothersome): -headache -heartburn or stomach pain -nausea, vomiting This list may not describe all possible side effects. Call your doctor for medical advice about side effects. You may report side effects to FDA at 1-800-FDA-1088. Where should I keep my medicine? This drug is given in a hospital or clinic and will not be stored at home. NOTE: This sheet is a summary. It may not cover all possible information. If you have questions about this medicine, talk to your doctor, pharmacist, or health care provider.    2016, Elsevier/Gold Standard. (2007-08-29 10:26:02)

## 2015-02-06 NOTE — Progress Notes (Signed)
Dr Laural Golden in to check pt. No new orders given.

## 2015-02-06 NOTE — Progress Notes (Signed)
Wife states pt had emesis 2x last pm/. No vomiting since 1900 02/05/15. No temp. Dr Laural Golden notified. No new orders given. Ok to proceed with remicade infusion.

## 2015-02-11 ENCOUNTER — Encounter (INDEPENDENT_AMBULATORY_CARE_PROVIDER_SITE_OTHER): Payer: Self-pay | Admitting: Internal Medicine

## 2015-02-12 ENCOUNTER — Telehealth (INDEPENDENT_AMBULATORY_CARE_PROVIDER_SITE_OTHER): Payer: Self-pay | Admitting: *Deleted

## 2015-02-12 NOTE — Telephone Encounter (Signed)
Hi Dr. Laural Golden,     I have a concern about my medication. 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 As of Jan. 1, my Pentasa co-pay went from $125 to $250 every 3 months. When I inquired about the price increase, an [employer] rep. stated that they have Pentasa listed as a Tier-3(non-preferred)medication. I was informed in order to have the price adjusted it has to qualify as a Teir-2 med. (preferred). In order for [employer] to consider my use of Pentasa as a preferred medication, the doctor prescribing the medication would need to contact their co. and provide reason and justification as to why there are no alternatives to Pentasa as it relates to my needs. The rep. stated that the best means of communication would be to contact [employer]'s authorization dept. at [id number] and speak with a clinical team.    Would you be willing to submit the necessary info. via phone conference with [employer]?     If you have questions , please call me.    Thank you for your help,    Connor Small

## 2015-02-19 NOTE — Telephone Encounter (Signed)
I have spoken with Connor Small at [facility]. A PA has been started and they are awaiting Clinicals for review. It will be a 24 - 48 hour turnaround time .  PA # [id number]

## 2015-02-20 ENCOUNTER — Encounter (HOSPITAL_COMMUNITY)
Admission: RE | Admit: 2015-02-20 | Discharge: 2015-02-20 | Disposition: A | Discharge status: Home / Self Care | Payer: BLUE CROSS/BLUE SHIELD | Source: Ambulatory Visit | Attending: Internal Medicine | Admitting: Internal Medicine

## 2015-02-20 DIAGNOSIS — K51819 Other ulcerative colitis with unspecified complications: Secondary | ICD-10-CM | POA: Diagnosis not present

## 2015-02-20 MED ORDER — DIPHENHYDRAMINE HCL 25 MG PO CAPS
25.0000 mg | ORAL_CAPSULE | Freq: Once | ORAL | Status: DC
Start: 2015-02-20 — End: 2015-02-21

## 2015-02-20 MED ORDER — SODIUM CHLORIDE 0.9 % IV SOLN
Freq: Once | INTRAVENOUS | Status: AC
  Administered 2015-02-20: 250 mL via INTRAVENOUS

## 2015-02-20 MED ORDER — ACETAMINOPHEN 325 MG PO TABS
650.0000 mg | ORAL_TABLET | Freq: Once | ORAL | Status: AC
  Administered 2015-02-20: 650 mg via ORAL
  Filled 2015-02-20: qty 2

## 2015-02-20 MED ORDER — SODIUM CHLORIDE 0.9 % IV SOLN
5.0000 mg/kg | Freq: Once | INTRAVENOUS | Status: AC
  Administered 2015-02-20: 400 mg via INTRAVENOUS
  Filled 2015-02-20: qty 40

## 2015-02-20 MED ORDER — DIPHENHYDRAMINE HCL 25 MG PO CAPS
25.0000 mg | ORAL_CAPSULE | Freq: Once | ORAL | Status: AC
  Administered 2015-02-20: 25 mg via ORAL

## 2015-02-20 MED ORDER — DIPHENHYDRAMINE HCL 25 MG PO TABS
25.0000 mg | ORAL_TABLET | Freq: Once | ORAL | Status: DC
  Filled 2015-02-20: qty 1

## 2015-02-20 MED ORDER — DIPHENHYDRAMINE HCL 25 MG PO TABS: 25.0000 mg | ORAL_TABLET | Freq: Once | ORAL | Status: DC

## 2015-02-20 MED ORDER — DIPHENHYDRAMINE HCL 25 MG PO CAPS
ORAL_CAPSULE | ORAL | Status: AC
  Filled 2015-02-20: qty 1

## 2015-02-20 NOTE — Progress Notes (Addendum)
Pharmacy called and stated they had to wait on remicade being brought from [facility] before they could mix it. Would be here approx. 40 minutes. Pt informed. Voiced understanding.

## 2015-02-23 ENCOUNTER — Encounter (INDEPENDENT_AMBULATORY_CARE_PROVIDER_SITE_OTHER): Payer: Self-pay | Admitting: *Deleted

## 2015-03-20 ENCOUNTER — Encounter (HOSPITAL_COMMUNITY)
Admission: RE | Admit: 2015-03-20 | Discharge: 2015-03-20 | Disposition: A | Discharge status: Home / Self Care | Payer: BLUE CROSS/BLUE SHIELD | Source: Ambulatory Visit | Attending: Internal Medicine | Admitting: Internal Medicine

## 2015-03-20 DIAGNOSIS — K51819 Other ulcerative colitis with unspecified complications: Secondary | ICD-10-CM | POA: Insufficient documentation

## 2015-03-20 MED ORDER — ACETAMINOPHEN 325 MG PO TABS
650.0000 mg | ORAL_TABLET | Freq: Once | ORAL | Status: AC
  Administered 2015-03-20: 650 mg via ORAL

## 2015-03-20 MED ORDER — DIPHENHYDRAMINE HCL 25 MG PO TABS
25.0000 mg | ORAL_TABLET | Freq: Once | ORAL | Status: DC
  Filled 2015-03-20 (×2): qty 1

## 2015-03-20 MED ORDER — SODIUM CHLORIDE 0.9 % IV SOLN
400.0000 mg | INTRAVENOUS | Status: DC
  Administered 2015-03-20: 400 mg via INTRAVENOUS
  Filled 2015-03-20: qty 40

## 2015-03-20 MED ORDER — DIPHENHYDRAMINE HCL 25 MG PO CAPS
25.0000 mg | ORAL_CAPSULE | Freq: Once | ORAL | Status: AC
  Administered 2015-03-20: 25 mg via ORAL

## 2015-04-21 ENCOUNTER — Ambulatory Visit (INDEPENDENT_AMBULATORY_CARE_PROVIDER_SITE_OTHER): Payer: BLUE CROSS/BLUE SHIELD | Admitting: Internal Medicine

## 2015-04-21 ENCOUNTER — Encounter (INDEPENDENT_AMBULATORY_CARE_PROVIDER_SITE_OTHER): Payer: Self-pay | Admitting: Internal Medicine

## 2015-04-21 VITALS — BP 110/68 | HR 66 | Temp 98.2°F | Resp 18 | Ht 70.0 in | Wt 173.2 lb

## 2015-04-21 DIAGNOSIS — K529 Noninfective gastroenteritis and colitis, unspecified: Secondary | ICD-10-CM

## 2015-04-21 DIAGNOSIS — D649 Anemia, unspecified: Secondary | ICD-10-CM | POA: Diagnosis not present

## 2015-04-21 DIAGNOSIS — K5120 Ulcerative (chronic) proctitis without complications: Secondary | ICD-10-CM

## 2015-04-21 MED ORDER — DIPHENOXYLATE-ATROPINE 2.5-0.025 MG PO TABS: 1.0000 | ORAL_TABLET | Freq: Four times a day (QID) | ORAL | Status: DC

## 2015-04-21 MED ORDER — MESALAMINE 1000 MG RE SUPP: 1000.0000 mg | Freq: Every day | RECTAL | Status: DC

## 2015-04-21 NOTE — Patient Instructions (Signed)
Can use Canasa suppository 3 times a week. Physician will call with results of blood tests when completed

## 2015-04-21 NOTE — Progress Notes (Signed)
Presenting complaint;  Follow-up for ulcerative proctitis.  Database:  Connor Small is 55 year old Caucasian male who is here for scheduled visit. He has over 16 a history of ulcerative colitis and underwent subtotal colectomy for fulminant C. difficile colitis in February 2014. 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 He had flexible sigmoidoscopy on 01/07/2015 at which time he was started on infliximab infusion. Hepatitis B surface antigen and PPD were negative.  Subjective:  Connor Small feels much better. He feels quality of life has improved significantly since he was begun on infliximab. Third dose was one month ago. He is not having any side effects. He is not having 6-7 stools per day. Some of his stools are formed or semi-formed. He is not having nocturnal diarrhea anymore. He is also not having any rectal pain. He is able to eat better. He has gained 7 pounds and since last visit. He says Pentasa co-pay is too high and he is wondering if he could stop this medication. He is using Canasa suppository every night.   Current Medications: Outpatient Encounter Prescriptions as of 04/21/2015  Medication Sig  . aspirin EC 81 MG tablet Take 81 mg by mouth daily.  . diphenoxylate-atropine (LOMOTIL) 2.5-0.025 MG per tablet Take 1 tablet by mouth 4 (four) times daily.  . InFLIXimab (REMICADE IV) Inject into the vein. Next infusion 05/15/2015. Patient is infused every 8 weeks.  . mesalamine (CANASA) 1000 MG suppository Place 1 suppository (1,000 mg total) rectally at bedtime.  . mesalamine (PENTASA) 500 MG CR capsule Take 2 capsules (1,000 mg total) by mouth 2 (two) times daily. Patient takes 2 by mouth twice daily  . Multiple Minerals-Vitamins (CITRACAL PLUS PO) Take 1 tablet by mouth 2 (two) times daily.  . Multiple Vitamin (MULTIVITAMIN) LIQD Take 5 mLs by mouth daily.  Marland Kitchen saccharomyces boulardii (FLORASTOR) 250 MG capsule Take 250 mg by mouth  daily.   . tamsulosin (FLOMAX) 0.4 MG CAPS capsule Take 0.4 mg by mouth. PRN  . [DISCONTINUED] hydrocortisone (CORTENEMA) 100 MG/60ML enema Place 1 enema (100 mg total) rectally at bedtime. (Patient not taking: Reported on 04/21/2015)  . [DISCONTINUED] loperamide (IMODIUM) 2 MG capsule Take 1 capsule (2 mg total) by mouth at bedtime. (Patient not taking: Reported on 04/21/2015)   No facility-administered encounter medications on file as of 04/21/2015.     Objective: Blood pressure 110/68, pulse 66, temperature 98.2 F (36.8 C), temperature source Oral, resp. rate 18, height 5' 10"  (1.778 m), weight 173 lb 3.2 oz (78.563 kg). Patient is alert and in no acute distress. Conjunctiva is pink. Sclera is nonicteric Oropharyngeal mucosa is normal. No neck masses or thyromegaly noted. Cardiac exam with regular rhythm normal S1 and S2. No murmur or gallop noted. Lungs are clear to auscultation. Abdomen is symmetrical with lower midline ventral hernia(incisional hernia) abdomen is soft and nontender without organomegaly or masses.  No LE edema or clubbing noted.  Labs/studies Results: Lab data from 08/29/2014  H&H 9.6 and 28.6  MCV 89.4   Assessment:  #1. Ulcerative proctitis. He has had excellent response to infliximab. He has completed induction therapy and now will be on maintenance. He is on Pentasa but co-pay is too high. Therefore will discontinue this medication. #2. Anemia. Hemoglobin 7 months ago was 9.6 g. He is presently not having rectal bleeding. Anemia would appear to be multifactorial. Unless H&H has normalized he will need further evaluation.   Plan:  Discontinue Pentasa. Continue Canasa suppositories  daily or 3 times a week for another 8 weeks and stop. Continue infliximab every 8 weeks for maintenance therapy. Patient will go the lab for CBC and CRP. Office visit in 6 months.

## 2015-04-23 LAB — CBC
HCT: 34.6 % — ABNORMAL LOW (ref 39.0–52.0)
HEMOGLOBIN: 11.6 g/dL — AB (ref 13.0–17.0)
MCH: 30.9 pg (ref 26.0–34.0)
MCHC: 33.5 g/dL (ref 30.0–36.0)
MCV: 92 fL (ref 78.0–100.0)
MPV: 8.8 fL (ref 8.6–12.4)
Platelets: 212 10*3/uL (ref 150–400)
RBC: 3.76 MIL/uL — AB (ref 4.22–5.81)
RDW: 14.8 % (ref 11.5–15.5)
WBC: 6.3 10*3/uL (ref 4.0–10.5)

## 2015-04-23 LAB — C-REACTIVE PROTEIN

## 2015-05-15 ENCOUNTER — Ambulatory Visit (HOSPITAL_COMMUNITY): Admission: RE | Admit: 2015-05-15 | Payer: BLUE CROSS/BLUE SHIELD | Source: Ambulatory Visit

## 2015-05-15 ENCOUNTER — Encounter (HOSPITAL_COMMUNITY)
Admission: RE | Admit: 2015-05-15 | Discharge: 2015-05-15 | Disposition: A | Discharge status: Home / Self Care | Payer: BLUE CROSS/BLUE SHIELD | Source: Ambulatory Visit | Attending: Internal Medicine | Admitting: Internal Medicine

## 2015-05-15 DIAGNOSIS — K51819 Other ulcerative colitis with unspecified complications: Secondary | ICD-10-CM | POA: Insufficient documentation

## 2015-05-15 MED ORDER — SODIUM CHLORIDE 0.9 % IV SOLN
INTRAVENOUS | Status: DC
  Administered 2015-05-15: 13:00:00 via INTRAVENOUS

## 2015-05-15 MED ORDER — ACETAMINOPHEN 325 MG PO TABS
ORAL_TABLET | ORAL | Status: AC
  Filled 2015-05-15: qty 2

## 2015-05-15 MED ORDER — DIPHENHYDRAMINE HCL 25 MG PO CAPS
25.0000 mg | ORAL_CAPSULE | Freq: Once | ORAL | Status: AC
  Administered 2015-05-15: 25 mg via ORAL

## 2015-05-15 MED ORDER — DIPHENHYDRAMINE HCL 25 MG PO CAPS
ORAL_CAPSULE | ORAL | Status: AC
  Filled 2015-05-15: qty 1

## 2015-05-15 MED ORDER — SODIUM CHLORIDE 0.9 % IV SOLN
400.0000 mg | INTRAVENOUS | Status: DC
  Administered 2015-05-15: 400 mg via INTRAVENOUS
  Filled 2015-05-15: qty 40

## 2015-05-15 MED ORDER — ACETAMINOPHEN 325 MG PO TABS
650.0000 mg | ORAL_TABLET | Freq: Once | ORAL | Status: AC
  Administered 2015-05-15: 650 mg via ORAL

## 2015-06-25 ENCOUNTER — Encounter (INDEPENDENT_AMBULATORY_CARE_PROVIDER_SITE_OTHER): Payer: Self-pay | Admitting: Internal Medicine

## 2015-07-10 ENCOUNTER — Encounter (HOSPITAL_COMMUNITY)
Admission: RE | Admit: 2015-07-10 | Discharge: 2015-07-10 | Disposition: A | Discharge status: Home / Self Care | Payer: BLUE CROSS/BLUE SHIELD | Source: Ambulatory Visit | Attending: Internal Medicine | Admitting: Internal Medicine

## 2015-07-10 DIAGNOSIS — K5190 Ulcerative colitis, unspecified, without complications: Secondary | ICD-10-CM | POA: Insufficient documentation

## 2015-07-10 MED ORDER — DIPHENHYDRAMINE HCL 25 MG PO CAPS
25.0000 mg | ORAL_CAPSULE | Freq: Once | ORAL | Status: AC
  Administered 2015-07-10: 25 mg via ORAL
  Filled 2015-07-10: qty 1

## 2015-07-10 MED ORDER — SODIUM CHLORIDE 0.9 % IV SOLN
5.0000 mg/kg | Freq: Once | INTRAVENOUS | Status: AC
  Administered 2015-07-10: 400 mg via INTRAVENOUS
  Filled 2015-07-10: qty 40

## 2015-07-10 MED ORDER — DIPHENHYDRAMINE HCL 25 MG PO CAPS
ORAL_CAPSULE | ORAL | Status: AC
  Filled 2015-07-10: qty 1

## 2015-07-10 MED ORDER — SODIUM CHLORIDE 0.9 % IV SOLN
Freq: Once | INTRAVENOUS | Status: AC
  Administered 2015-07-10: 250 mL via INTRAVENOUS

## 2015-07-10 MED ORDER — ACETAMINOPHEN 325 MG PO TABS
ORAL_TABLET | ORAL | Status: AC
  Filled 2015-07-10: qty 2

## 2015-07-10 MED ORDER — ACETAMINOPHEN 325 MG PO TABS
650.0000 mg | ORAL_TABLET | Freq: Once | ORAL | Status: AC
  Administered 2015-07-10: 650 mg via ORAL

## 2015-09-04 ENCOUNTER — Encounter (HOSPITAL_COMMUNITY)
Admission: RE | Admit: 2015-09-04 | Discharge: 2015-09-04 | Disposition: A | Discharge status: Home / Self Care | Payer: BLUE CROSS/BLUE SHIELD | Source: Ambulatory Visit | Attending: Internal Medicine | Admitting: Internal Medicine

## 2015-09-04 DIAGNOSIS — K5190 Ulcerative colitis, unspecified, without complications: Secondary | ICD-10-CM | POA: Insufficient documentation

## 2015-09-04 MED ORDER — ACETAMINOPHEN 325 MG PO TABS
650.0000 mg | ORAL_TABLET | Freq: Once | ORAL | Status: AC
  Administered 2015-09-04: 650 mg via ORAL

## 2015-09-04 MED ORDER — SODIUM CHLORIDE 0.9 % IV SOLN
5.0000 mg/kg | Freq: Once | INTRAVENOUS | Status: AC
  Administered 2015-09-04: 400 mg via INTRAVENOUS
  Filled 2015-09-04: qty 40

## 2015-09-04 MED ORDER — DIPHENHYDRAMINE HCL 25 MG PO CAPS
25.0000 mg | ORAL_CAPSULE | Freq: Once | ORAL | Status: AC
  Administered 2015-09-04: 25 mg via ORAL

## 2015-09-04 MED ORDER — ACETAMINOPHEN 325 MG PO TABS
ORAL_TABLET | ORAL | Status: AC
  Filled 2015-09-04: qty 2

## 2015-09-04 MED ORDER — SODIUM CHLORIDE 0.9 % IV SOLN
INTRAVENOUS | Status: DC
  Administered 2015-09-04: 250 mL via INTRAVENOUS

## 2015-09-04 MED ORDER — DIPHENHYDRAMINE HCL 25 MG PO CAPS
ORAL_CAPSULE | ORAL | Status: AC
  Filled 2015-09-04: qty 1

## 2015-09-21 ENCOUNTER — Encounter (INDEPENDENT_AMBULATORY_CARE_PROVIDER_SITE_OTHER): Payer: Self-pay | Admitting: Internal Medicine

## 2015-09-21 DIAGNOSIS — K529 Noninfective gastroenteritis and colitis, unspecified: Secondary | ICD-10-CM

## 2015-09-29 MED ORDER — DIPHENOXYLATE-ATROPINE 2.5-0.025 MG PO TABS: 1.0000 | ORAL_TABLET | Freq: Four times a day (QID) | ORAL | 2 refills | Status: DC

## 2015-10-27 ENCOUNTER — Encounter (INDEPENDENT_AMBULATORY_CARE_PROVIDER_SITE_OTHER): Payer: Self-pay | Admitting: *Deleted

## 2015-10-27 ENCOUNTER — Ambulatory Visit (INDEPENDENT_AMBULATORY_CARE_PROVIDER_SITE_OTHER): Payer: BLUE CROSS/BLUE SHIELD | Admitting: Internal Medicine

## 2015-10-27 ENCOUNTER — Encounter (INDEPENDENT_AMBULATORY_CARE_PROVIDER_SITE_OTHER): Payer: Self-pay | Admitting: Internal Medicine

## 2015-10-27 VITALS — BP 104/70 | HR 66 | Temp 97.7°F | Resp 18 | Ht 70.0 in | Wt 175.3 lb

## 2015-10-27 DIAGNOSIS — K529 Noninfective gastroenteritis and colitis, unspecified: Secondary | ICD-10-CM

## 2015-10-27 DIAGNOSIS — D508 Other iron deficiency anemias: Secondary | ICD-10-CM | POA: Diagnosis not present

## 2015-10-27 DIAGNOSIS — K51219 Ulcerative (chronic) proctitis with unspecified complications: Secondary | ICD-10-CM

## 2015-10-27 MED ORDER — MESALAMINE 1000 MG RE SUPP: 1000.0000 mg | Freq: Every day | RECTAL | 5 refills | Status: DC

## 2015-10-27 NOTE — Patient Instructions (Signed)
CRP to be done with next blood work. Flexible sigmoidoscopy be scheduled in January 2018.

## 2015-10-27 NOTE — Progress Notes (Signed)
Presenting complaint;  Follow-up for inflammatory bowel disease.  Database and Subjective:  Connor Small is 55 year old Caucasian male who has history of ulcerative colitis. Disease duration about 19 years. In February 2014 he presented with acute abdomen when he was transferred to [facility] and underwent emergency laparotomy and found to have a sigmoid colon stricture but no evidence of active disease. He had temporary colostomy. He had colonoscopy in January 2013 revealing disease to be in remission prior to takedown of colostomy. He presented again in February 2014 with fulminant C. difficile colitis secondary to clindamycin. He had high-grade anastomotic stricture. He underwent subtotal colectomy. He had prolonged illness and hospitalization and he finally recovered and able to return to work. Last year he was noted have active disease in the rectum and ileorectal anastomotic stricture and he was begun on infliximab in January this year. He was last seen on 04/21/2015.   Patient is accompanied by [name]. He feels he is doing well. He has an average of 6-8 stools daily. He also has a bowel movement every morning between 2:30 and 3 AM. Most of the stools are small to moderate volume. He has occasional urgency. He has not had any accidents. Every now and then he mainly noticed blood on the tissue which he believes is secondary to hemorrhoids. He feels diphenoxylate helps but it has caused blurred vision. He has good appetite. He has gained 2 pounds since his last visit. He was seen by urologist. He has one stone remaining but it is not causing obstruction. He states he had blood work back in March this year by Dr. Anastasia Pall. Iron studies were low and he was begun on ferrous sulfate. He has seen Dr. Ronnell Freshwater and is planning to have repair of ventral hernia early next year. He was told that infliximab will have to be interrupted for a few weeks. Patient wants to make sure that he  is in remission before infliximab stopped. He is on mesalamine suppositories and he takes at least 3-4 every week.   Current Medications: Outpatient Encounter Prescriptions as of 10/27/2015  Medication Sig  . aspirin EC 81 MG tablet Take 81 mg by mouth daily.  . diphenoxylate-atropine (LOMOTIL) 2.5-0.025 MG tablet Take 1 tablet by mouth 4 (four) times daily.  . ferrous sulfate 325 (65 FE) MG tablet Take 325 mg by mouth 2 (two) times daily with a meal.  . InFLIXimab (REMICADE IV) Inject into the vein. Next infusion 05/15/2015. Patient is infused every 8 weeks.  . mesalamine (CANASA) 1000 MG suppository Place 1 suppository (1,000 mg total) rectally at bedtime.  . Multiple Minerals-Vitamins (CITRACAL PLUS PO) Take 1 tablet by mouth 2 (two) times daily.  . Multiple Vitamin (MULTIVITAMIN) LIQD Take 5 mLs by mouth daily.  Marland Kitchen saccharomyces boulardii (FLORASTOR) 250 MG capsule Take 250 mg by mouth daily.   . [DISCONTINUED] mesalamine (CANASA) 1000 MG suppository Place 1 suppository (1,000 mg total) rectally at bedtime.  . [DISCONTINUED] tamsulosin (FLOMAX) 0.4 MG CAPS capsule Take 0.4 mg by mouth. PRN   No facility-administered encounter medications on file as of 10/27/2015.      Objective: Blood pressure 104/70, pulse 66, temperature 97.7 F (36.5 C), temperature source Oral, resp. rate 18, height 5' 10"  (1.778 m), weight 175 lb 4.8 oz (79.5 kg). Patient is alert and in no acute distress. Conjunctiva is pink. Sclera is nonicteric Oropharyngeal mucosa is normal. No neck masses or thyromegaly noted. Cardiac exam with regular rhythm normal S1 and  S2. No murmur or gallop noted. Lungs are clear to auscultation. Abdomen abdomen is symmetrical with lower midline scar enlarged when hernia which is completely reducible. He also has scar in right low quadrant from prior ileostomy. Abdomen is soft and nontender without organomegaly or masses. No LE edema or clubbing noted. He has limited flexion to  fingers and right hand(prior CVA)  Labs/studies Results: CRP was <0.5  on 04/21/2015   Lab data from July 2017  Serum iron 79 TIBC 286 and saturation 28%  Serum creatinine 1.45  Serum sodium 141, potassium 4.7, chloride 104, CO2 29, glucose 98 and BUN 25.  Bilirubin 0.2, AP 58, AST 20, ALT 12 and serum calcium 8.9.    Assessment:  #1. Ulcerative proctitis. Patient is on infliximab and topical mesalamine and appears to be doing well. Infliximab was begun on 02/20/2015. Patient appears to be in remission. Last sigmoidoscopy was in December 2016 revealing rectal involvement along with noncritical ileorectal anastomosis. #2. 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 He does not have any reservoir and I am afraid increased frequency of defecation will be chronic. #3. History of iron deficiency anemia. He was noted to have low serum iron and hemoglobin by Dr. Melford Aase back in March 2017. Iron studies were normal in July 2017 as above. He is due for H&H.   Plan:  Patient will have CRP with planned blood work later this month to include CBC. Patient will continue diphenoxylate at a dose of 2-4 tablets per day as tolerated. Prescription was filled last month. He will continue mesalamine suppository 1 per rectum daily at bedtime. He should try to use at least 3-4 doses every week. Flexible sigmoidoscopy in December 2017 to document endoscopic remission prior to infliximab interruption for ventral herniorrhaphy. Office visit in 6 months.

## 2015-10-28 ENCOUNTER — Other Ambulatory Visit (INDEPENDENT_AMBULATORY_CARE_PROVIDER_SITE_OTHER): Payer: Self-pay | Admitting: *Deleted

## 2015-10-28 DIAGNOSIS — D649 Anemia, unspecified: Secondary | ICD-10-CM | POA: Insufficient documentation

## 2015-10-28 DIAGNOSIS — D508 Other iron deficiency anemias: Secondary | ICD-10-CM

## 2015-10-28 DIAGNOSIS — R1310 Dysphagia, unspecified: Secondary | ICD-10-CM | POA: Insufficient documentation

## 2015-10-28 DIAGNOSIS — K529 Noninfective gastroenteritis and colitis, unspecified: Secondary | ICD-10-CM

## 2015-10-28 DIAGNOSIS — K51218 Ulcerative (chronic) proctitis with other complication: Secondary | ICD-10-CM

## 2015-10-28 DIAGNOSIS — R197 Diarrhea, unspecified: Secondary | ICD-10-CM | POA: Insufficient documentation

## 2015-10-30 ENCOUNTER — Encounter (HOSPITAL_COMMUNITY)
Admission: RE | Admit: 2015-10-30 | Discharge: 2015-10-30 | Disposition: A | Discharge status: Home / Self Care | Payer: BLUE CROSS/BLUE SHIELD | Source: Ambulatory Visit | Attending: Internal Medicine | Admitting: Internal Medicine

## 2015-10-30 DIAGNOSIS — K5190 Ulcerative colitis, unspecified, without complications: Secondary | ICD-10-CM | POA: Diagnosis not present

## 2015-10-30 MED ORDER — SODIUM CHLORIDE 0.9 % IV SOLN
5.0000 mg/kg | Freq: Once | INTRAVENOUS | Status: AC
  Administered 2015-10-30: 400 mg via INTRAVENOUS
  Filled 2015-10-30: qty 10

## 2015-10-30 MED ORDER — SODIUM CHLORIDE 0.9 % IV SOLN
INTRAVENOUS | Status: DC
  Administered 2015-10-30: 250 mL via INTRAVENOUS

## 2015-12-02 ENCOUNTER — Encounter (INDEPENDENT_AMBULATORY_CARE_PROVIDER_SITE_OTHER): Payer: Self-pay | Admitting: *Deleted

## 2015-12-02 ENCOUNTER — Other Ambulatory Visit (INDEPENDENT_AMBULATORY_CARE_PROVIDER_SITE_OTHER): Payer: Self-pay | Admitting: *Deleted

## 2015-12-02 DIAGNOSIS — R197 Diarrhea, unspecified: Secondary | ICD-10-CM

## 2015-12-02 DIAGNOSIS — R1310 Dysphagia, unspecified: Secondary | ICD-10-CM

## 2015-12-02 DIAGNOSIS — K51218 Ulcerative (chronic) proctitis with other complication: Secondary | ICD-10-CM

## 2015-12-11 ENCOUNTER — Encounter (INDEPENDENT_AMBULATORY_CARE_PROVIDER_SITE_OTHER): Payer: Self-pay

## 2015-12-25 ENCOUNTER — Encounter (HOSPITAL_COMMUNITY)
Admission: RE | Admit: 2015-12-25 | Discharge: 2015-12-25 | Disposition: A | Discharge status: Home / Self Care | Payer: BLUE CROSS/BLUE SHIELD | Source: Ambulatory Visit | Attending: Internal Medicine | Admitting: Internal Medicine

## 2015-12-25 DIAGNOSIS — K5190 Ulcerative colitis, unspecified, without complications: Secondary | ICD-10-CM | POA: Diagnosis not present

## 2015-12-25 MED ORDER — SODIUM CHLORIDE 0.9 % IV SOLN
5.0000 mg/kg | INTRAVENOUS | Status: DC
  Administered 2015-12-25: 400 mg via INTRAVENOUS
  Filled 2015-12-25: qty 10

## 2015-12-25 MED ORDER — DIPHENHYDRAMINE HCL 25 MG PO CAPS: 25.0000 mg | ORAL_CAPSULE | Freq: Once | ORAL | Status: DC

## 2015-12-25 MED ORDER — DIPHENHYDRAMINE HCL 25 MG PO TABS
25.0000 mg | ORAL_TABLET | Freq: Once | ORAL | Status: DC
  Filled 2015-12-25: qty 1

## 2015-12-25 MED ORDER — SODIUM CHLORIDE 0.9 % IV SOLN
Freq: Once | INTRAVENOUS | Status: AC
  Administered 2015-12-25: 13:00:00 via INTRAVENOUS

## 2015-12-25 MED ORDER — ACETAMINOPHEN 325 MG PO TABS: 650.0000 mg | ORAL_TABLET | Freq: Once | ORAL | Status: DC

## 2016-01-14 ENCOUNTER — Encounter (HOSPITAL_COMMUNITY)
Admission: RE | Disposition: A | Discharge status: Home / Self Care | Payer: Self-pay | Source: Ambulatory Visit | Attending: Internal Medicine

## 2016-01-14 ENCOUNTER — Encounter (HOSPITAL_COMMUNITY): Payer: Self-pay

## 2016-01-14 ENCOUNTER — Ambulatory Visit (HOSPITAL_COMMUNITY)
Admission: RE | Admit: 2016-01-14 | Discharge: 2016-01-14 | Disposition: A | Discharge status: Home / Self Care | Payer: BLUE CROSS/BLUE SHIELD | Source: Ambulatory Visit | Attending: Internal Medicine | Admitting: Internal Medicine

## 2016-01-14 DIAGNOSIS — D649 Anemia, unspecified: Secondary | ICD-10-CM | POA: Insufficient documentation

## 2016-01-14 DIAGNOSIS — Z79899 Other long term (current) drug therapy: Secondary | ICD-10-CM | POA: Diagnosis not present

## 2016-01-14 DIAGNOSIS — K5669 Other partial intestinal obstruction: Secondary | ICD-10-CM | POA: Diagnosis not present

## 2016-01-14 DIAGNOSIS — Z9049 Acquired absence of other specified parts of digestive tract: Secondary | ICD-10-CM | POA: Insufficient documentation

## 2016-01-14 DIAGNOSIS — K921 Melena: Secondary | ICD-10-CM | POA: Diagnosis not present

## 2016-01-14 DIAGNOSIS — Z8673 Personal history of transient ischemic attack (TIA), and cerebral infarction without residual deficits: Secondary | ICD-10-CM | POA: Insufficient documentation

## 2016-01-14 DIAGNOSIS — Z980 Intestinal bypass and anastomosis status: Secondary | ICD-10-CM | POA: Insufficient documentation

## 2016-01-14 DIAGNOSIS — Z87891 Personal history of nicotine dependence: Secondary | ICD-10-CM | POA: Insufficient documentation

## 2016-01-14 DIAGNOSIS — R197 Diarrhea, unspecified: Secondary | ICD-10-CM | POA: Insufficient documentation

## 2016-01-14 DIAGNOSIS — K529 Noninfective gastroenteritis and colitis, unspecified: Secondary | ICD-10-CM | POA: Insufficient documentation

## 2016-01-14 DIAGNOSIS — Z7982 Long term (current) use of aspirin: Secondary | ICD-10-CM | POA: Diagnosis not present

## 2016-01-14 DIAGNOSIS — K2210 Ulcer of esophagus without bleeding: Secondary | ICD-10-CM | POA: Insufficient documentation

## 2016-01-14 DIAGNOSIS — R1314 Dysphagia, pharyngoesophageal phase: Secondary | ICD-10-CM | POA: Diagnosis not present

## 2016-01-14 DIAGNOSIS — Z86718 Personal history of other venous thrombosis and embolism: Secondary | ICD-10-CM | POA: Insufficient documentation

## 2016-01-14 DIAGNOSIS — R1310 Dysphagia, unspecified: Secondary | ICD-10-CM | POA: Insufficient documentation

## 2016-01-14 DIAGNOSIS — K51218 Ulcerative (chronic) proctitis with other complication: Secondary | ICD-10-CM

## 2016-01-14 DIAGNOSIS — K228 Other specified diseases of esophagus: Secondary | ICD-10-CM | POA: Diagnosis not present

## 2016-01-14 DIAGNOSIS — Y832 Surgical operation with anastomosis, bypass or graft as the cause of abnormal reaction of the patient, or of later complication, without mention of misadventure at the time of the procedure: Secondary | ICD-10-CM | POA: Insufficient documentation

## 2016-01-14 DIAGNOSIS — K9189 Other postprocedural complications and disorders of digestive system: Secondary | ICD-10-CM | POA: Insufficient documentation

## 2016-01-14 DIAGNOSIS — K5120 Ulcerative (chronic) proctitis without complications: Secondary | ICD-10-CM | POA: Diagnosis present

## 2016-01-14 HISTORY — PX: ESOPHAGOGASTRODUODENOSCOPY: SHX5428

## 2016-01-14 HISTORY — PX: FLEXIBLE SIGMOIDOSCOPY: SHX5431

## 2016-01-14 SURGERY — SIGMOIDOSCOPY, FLEXIBLE
Anesthesia: Moderate Sedation

## 2016-01-14 MED ORDER — MEPERIDINE HCL 50 MG/ML IJ SOLN
INTRAMUSCULAR | Status: AC
  Filled 2016-01-14: qty 1

## 2016-01-14 MED ORDER — SODIUM CHLORIDE 0.9 % IV SOLN
INTRAVENOUS | Status: DC
  Administered 2016-01-14: 11:00:00 via INTRAVENOUS

## 2016-01-14 MED ORDER — PANTOPRAZOLE SODIUM 40 MG PO TBEC: 40.0000 mg | DELAYED_RELEASE_TABLET | Freq: Every day | ORAL | 5 refills | Status: DC

## 2016-01-14 MED ORDER — MIDAZOLAM HCL 5 MG/5ML IJ SOLN
INTRAMUSCULAR | Status: AC
  Filled 2016-01-14: qty 10

## 2016-01-14 MED ORDER — MIDAZOLAM HCL 5 MG/5ML IJ SOLN
INTRAMUSCULAR | Status: DC | PRN
  Administered 2016-01-14 (×2): 2 mg via INTRAVENOUS
  Administered 2016-01-14: 1 mg via INTRAVENOUS

## 2016-01-14 MED ORDER — MESALAMINE 1000 MG RE SUPP: 1000.0000 mg | Freq: Every day | RECTAL | 12 refills | Status: DC

## 2016-01-14 MED ORDER — MEPERIDINE HCL 25 MG/ML IJ SOLN
INTRAMUSCULAR | Status: DC | PRN
  Administered 2016-01-14 (×2): 25 mg via INTRAVENOUS

## 2016-01-14 MED ORDER — BUTAMBEN-TETRACAINE-BENZOCAINE 2-2-14 % EX AERO
INHALATION_SPRAY | CUTANEOUS | Status: DC | PRN
  Administered 2016-01-14: 1 via TOPICAL

## 2016-01-14 NOTE — Discharge Instructions (Signed)
Resume usual medications and diet. Canasa suppositories 1 g per rectum daily at bedtime. Pantoprazole 40 mg by mouth 30 minutes before breakfast daily. No driving for 24 hours. Office visit in April 2018 as planned.   Esophagogastroduodenoscopy, Care After Introduction Refer to this sheet in the next few weeks. These instructions provide you with information about caring for yourself after your procedure. Your health care provider may also give you more specific instructions. Your treatment has been planned according to current medical practices, but problems sometimes occur. Call your health care provider if you have any problems or questions after your procedure. What can I expect after the procedure? After the procedure, it is common to have:  A sore throat.  Nausea.  Bloating.  Dizziness.  Fatigue. Follow these instructions at home:  Do not eat or drink anything until the numbing medicine (local anesthetic) has worn off and your gag reflex has returned. You will know that the local anesthetic has worn off when you can swallow comfortably.  Do not drive for 24 hours if you received a medicine to help you relax (sedative).  If your health care provider took a tissue sample for testing during the procedure, make sure to get your test results. This is your responsibility. Ask your health care provider or the department performing the test when your results will be ready.  Keep all follow-up visits as told by your health care provider. This is important. Contact a health care provider if:  You cannot stop coughing.  You are not urinating.  You are urinating less than usual. Get help right away if:  You have trouble swallowing.  You cannot eat or drink.  You have throat or chest pain that gets worse.  You are dizzy or light-headed.  You faint.  You have nausea or vomiting.  You have chills.  You have a fever.  You have severe abdominal pain.  You have black,  tarry, or bloody stools. This information is not intended to replace advice given to you by your health care provider. Make sure you discuss any questions you have with your health care provider. Document Released: 12/28/2011 Document Revised: 06/18/2015 Document Reviewed: 12/04/2014  2017 Elsevier     Flexible Sigmoidoscopy, Care After This sheet gives you information about how to care for yourself after your procedure. Your health care provider may also give you more specific instructions. If you have problems or questions, contact your health care provider. What can I expect after the procedure? After the procedure, it is common to have:  Abdominal cramping or pain.  Bloating.  A small amount of rectal bleeding if you had a biopsy. Follow these instructions at home:  Take over-the-counter and prescription medicines only as told by your health care provider.  Do not drive for 24 hours if you received a medicine to help you relax (sedative).  Keep all follow-up visits as told by your health care provider. This is important. Contact a health care provider if:  You have abdominal pain or cramping that gets worse or is not helped with medicine.  You continue to have small amounts of rectal bleeding after 24 hours.  You have nausea or vomiting.  You feel weak or dizzy.  You have a fever. Get help right away if:  You pass large blood clots or see a large amount of blood in the toilet after having a bowel movement.  You have nausea or vomiting for more than 24 hours after the procedure. This information  is not intended to replace advice given to you by your health care provider. Make sure you discuss any questions you have with your health care provider. Document Released: 01/15/2013 Document Revised: 07/31/2015 Document Reviewed: 04/11/2015 Elsevier Interactive Patient Education  2017 [employer].

## 2016-01-14 NOTE — H&P (Addendum)
Connor Small is an 55 y.o. male.   Chief Complaint: Patient is here for EGD possible ED and flexible sigmoidoscopy. HPI: Patient is 55 year old Caucasian male who has history of ulcerative proctitis(rest of his colon has been removed) was maintained on infliximab. Last sigmoidoscopy was in November 2016 revealing active disease. He is still having 6-8 stools per day. Stool consistency isn't root. He has hematochezia twice a week. He believes it is secondary to hemorrhoids. He is undergoing sigmoidoscopy to assess disease activity. Patient also complains of file need to clear his throat frequently. He is not having any heartburn. Dr. Melford Aase felt his esophagus raise to be dilated but patient has no difficulty swallowing liquids or solids. He is undergoing diagnostic EGD with esophageal dilation if indicated.  Past Medical History:  Diagnosis Date  . Allergic rhinitis 06/28/2012  . Ascites s/p US guide paracentesis (4/2) 04/25/2012  . Aspiration pneumonitis ([place]) 04/26/2012  . Chronic diarrhea   . Clostridium difficile colitis 04/26/2012  . 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 HAP (hospital-acquired pneumonia) 04/21/2012  . Hemorrhoids   . History of acute renal failure    post-op  total colectomy 02-27-2012--  required dialysis--  resolved  . History of ARDS    02-27-2012  post op total colectomy -- septic shock-  required intubation/ventilator for 3-4 wks  . History of atrial fibrillation    02/ 2014--  no issues since  . History of Clostridium difficile colitis    02-27-2012--  fulminant C.Diff colitis caused toxic megacolon--  s/p  total colectomy  . History of DVT (deep vein thrombosis)    2014--  right upper extremity  . History of pleural effusion    acute- bilateral in setting septic shock post op Feb 2014  . History of septic shock dx's at this time--  ARDS, ARF, CVA, A-Fib   Feb 2014--  post op total colectomy from toxic megacolon/ fulminant C.Diff Colitis  . History of  stroke March 2014 --  residual right weaker than prior to cva, unable to make fist   Bilateral cerebral and right cerebellar infarcts - felt to be embolic vs hypotensive (in setting of septic shock, transient a-fib, ARDS)  . PAF (paroxysmal atrial fibrillation) ([place]) 03/15/2012  . Rectal bleed   . Renal calculi    bilateral--  left > right  . Ulcerative colitis followed by dr Laural Golden   Distal UC dx 2004  . Urgency of urination     Past Surgical History:  Procedure Laterality Date  . APPLICATION OF WOUND VAC N/A 03/26/2012   Procedure: APPLICATION OF WOUND VAC;  Surgeon: Edward Jolly, MD;  Location: [place];  Service: General;  Laterality: N/A;  . CECOSTOMY  02/27/2012   Procedure: CECOSTOMY;  Surgeon: Donato Heinz, MD;  Location: [facility];  Service: General;  Laterality: N/A;  Cecostomy Tube Placement  . COLECTOMY  02/28/2012   Procedure: TOTAL COLECTOMY;  Surgeon: Donato Heinz, MD;  Location: [facility];  Service: General;  Laterality: N/A;  . COLONOSCOPY  02/16/2011   Procedure: COLONOSCOPY;  Surgeon: Rogene Houston, MD;  [facility];  Service: Endoscopy;  Laterality: N/A;  100  . CYSTOSCOPY W/ URETERAL STENT PLACEMENT Bilateral 03/20/2012   Procedure: CYSTOSCOPY WITH RETROGRADE PYELOGRAM/;  Surgeon: Alexis Frock, MD;  Location: [place];  Service: Urology;  Laterality: Bilateral. Pt reports no stents were placed.  . CYSTOSCOPY WITH RETROGRADE PYELOGRAM, URETEROSCOPY AND STENT PLACEMENT Left 07/16/2014   Procedure: CYSTOSCOPY WITH  RETROGRADE PYELOGRAM,  AND LEFT STENT PLACEMENT;  Surgeon: Alexis Frock, MD;  Location: [facility];  Service: Urology;  Laterality: Left;  . CYSTOSCOPY WITH RETROGRADE PYELOGRAM, URETEROSCOPY AND STENT PLACEMENT Bilateral 07/25/2014   Procedure: CYSTOSCOPY WITH RETROGRADE PYELOGRAM, URETEROSCOPY AND BILATERAL STENT PLACEMENT;  Surgeon: Alexis Frock, MD;  Location[facility];  Service: Urology;  Laterality: Bilateral;  . FLEXIBLE  SIGMOIDOSCOPY  02/27/2012   Procedure: FLEXIBLE SIGMOIDOSCOPY;  Surgeon: Rogene Houston, MD;  [facility];  Service: Endoscopy;  Laterality: N/A;  with colonic decompression  . FLEXIBLE SIGMOIDOSCOPY N/A 02/06/2013   Procedure: FLEXIBLE SIGMOIDOSCOPY;  Surgeon: Rogene Houston, MD;  [facility];  Service: Endoscopy;  Laterality: N/A;  100-moved to 1200 Ann notified pt  . FLEXIBLE SIGMOIDOSCOPY N/A 03/27/2013   Procedure: FLEXIBLE SIGMOIDOSCOPY;  Surgeon: Rogene Houston, MD;  [facility];  Service: Endoscopy;  Laterality: N/A;  225  . FLEXIBLE SIGMOIDOSCOPY N/A 12/11/2013   Procedure: FLEXIBLE SIGMOIDOSCOPY;  Surgeon: Rogene Houston, MD;  [facility];  Service: Endoscopy;  Laterality: N/A;  200  . FLEXIBLE SIGMOIDOSCOPY N/A 01/07/2015   Procedure: FLEXIBLE SIGMOIDOSCOPY;  Surgeon: Rogene Houston, MD;  [facility];  Service: Endoscopy;  Laterality: N/A;  830  . HOLMIUM LASER APPLICATION Left 0/03/5007   Procedure: HOLMIUM LASER APPLICATION, LEFT ONLY ;  Surgeon: Alexis Frock, MD;  Location: [facility];  Service: Urology;  Laterality: Left;  . ILEOSTOMY  02/28/2012   Procedure: ILEOSTOMY;  Surgeon: Donato Heinz, MD;  Location: [facility];  Service: General;  Laterality: N/A;  . ILEOSTOMY CLOSURE N/A 05/27/2013   Procedure: ILEOSTOMY TAKEDOWN WITH ILEOPROCTOSTOMY;  Surgeon: Edward Jolly, MD;  Location: [facility];  Service: General;  Laterality: N/A;  . LAPAROTOMY  02/27/2012   Procedure: EXPLORATORY LAPAROTOMY;  Surgeon: Donato Heinz, MD;  Location: [facility];  Service: General;  Laterality: N/A;  . LAPAROTOMY N/A 03/26/2012   Procedure: EXPLORATORY LAPAROTOMY;  Surgeon: Edward Jolly, MD;  Location: [place];  Service: General;  Laterality: N/A;  . LAPAROTOMY N/A 03/29/2012   Procedure: EXPLORATORY LAPAROTOMY, PARTIAL WOUND CLOSURE;  Surgeon: Edward Jolly, MD;  Location: [place];  Service: General;  Laterality: N/A;  .  LIVER BIOPSY N/A 03/26/2012   Procedure: LIVER BIOPSY;  Surgeon: Edward Jolly, MD;  Location: [place];  Service: General;  Laterality: N/A;  . SIGMOID COLECTOMY /  LOW ANTERIOR RESECTION/  END SIGMOID COLOSTOMY  11-07-2010   [facility]   Sigmoid colonic perferation---  TAKE DOWN COLOSTOMY,  Jan 2013  . TRANSTHORACIC ECHOCARDIOGRAM  03-26-2012  (in setting of septic shock)   report states  poorly visualized--  normal LVSF, ef 50-55% and no pericardial effusion  . VACUUM ASSISTED CLOSURE CHANGE N/A 03/29/2012   Procedure: Open ABDOMINAL VACUUM  CHANGE;  Surgeon: Edward Jolly, MD;  Location: [place];  Service: General;  Laterality: N/A;  . VACUUM ASSISTED CLOSURE CHANGE N/A 04/01/2012   Procedure: removal of abdominal vac dressing and abdominal closure;  Surgeon: Edward Jolly, MD;  Location: [facility];  Service: General;  Laterality: N/A;    Family History  Problem Relation Age of Onset  . Cancer Sister   . Healthy Daughter   . Hypertension Mother   . Colon cancer Neg Hx    Social History:  reports that he quit smoking about 31 years ago. His smoking use included Cigarettes. He has a 6.00 pack-year  smoking history. He has never used smokeless tobacco. He reports that he does not drink alcohol or use drugs.  Allergies:  Allergies  Allergen Reactions  . Penicillins Other (See Comments)    "Heart rate slowed down"  bradycardia Has patient had a PCN reaction causing immediate rash, facial/tongue/throat swelling, SOB or lightheadedness with hypotension: Yes Has patient had a PCN reaction causing severe rash involving mucus membranes or skin necrosis: No Has patient had a PCN reaction that required hospitalization Yes Has patient had a PCN reaction occurring within the last 10 years: Yes If all of the above answers are "NO", then may proceed with Cephalosporin use.   . Clindamycin/Lincomycin Diarrhea    C-diff  . Morphine And Related Nausea And Vomiting  . Claritin [Loratadine] Rash   . Contrast Media [Iodinated Diagnostic Agents] Hives    Patient reports hives after IV contrast.   . Primaxin [Imipenem] Rash    Medications Prior to Admission  Medication Sig Dispense Refill  . acetaminophen (TYLENOL) 500 MG tablet Take 500 mg by mouth every 6 (six) hours as needed for moderate pain or headache.    Marland Kitchen aspirin EC 81 MG tablet Take 81 mg by mouth daily.    . diphenoxylate-atropine (LOMOTIL) 2.5-0.025 MG tablet Take 1 tablet by mouth 4 (four) times daily. 120 tablet 2  . ferrous sulfate 325 (65 FE) MG tablet Take 325 mg by mouth 2 (two) times daily with a meal.    . InFLIXimab (REMICADE IV) Inject into the vein. Next infusion 05/15/2015. Patient is infused every 8 weeks.    . mesalamine (CANASA) 1000 MG suppository Place 1 suppository (1,000 mg total) rectally at bedtime. (Patient taking differently: Place 1,000 mg rectally 3 (three) times a week. Mon, Wed, and Fri) 30 suppository 5  . Multiple Minerals-Vitamins (CITRACAL PLUS PO) Take 1 tablet by mouth 2 (two) times daily.    . Multiple Vitamin (MULTIVITAMIN WITH MINERALS) TABS tablet Take 1 tablet by mouth daily.    Marland Kitchen saccharomyces boulardii (FLORASTOR) 250 MG capsule Take 250 mg by mouth daily.     . vitamin C (ASCORBIC ACID) 500 MG tablet Take 500 mg by mouth daily.    . clobetasol cream (TEMOVATE) 7.51 % Apply 1 application topically daily as needed for dry skin.    . fluticasone (FLONASE) 50 MCG/ACT nasal spray Place 1 spray into both nostrils daily as needed for allergies or rhinitis.      No results found for this or any previous visit (from the past 48 hour(s)). No results found.  ROS  Blood pressure 123/73, pulse (!) 50, temperature 97.4 F (36.3 C), temperature source Oral, resp. rate 14, SpO2 100 %. Physical Exam  Constitutional: He appears well-developed and well-nourished.  HENT:  Mouth/Throat: Oropharynx is clear and moist.  Eyes: Conjunctivae are normal. No scleral icterus.  Neck: No thyromegaly  present.  Cardiovascular: Normal rate, regular rhythm and normal heart sounds.   No murmur heard. Respiratory: Effort normal and breath sounds normal.  GI:  Lower abdominal scar with incisional hernia. He also has horizontal scar in both lower quadrants. Abdomen is soft and nontender without organomegaly or masses.  Musculoskeletal: He exhibits no edema.  eformity to his right hand with muscle weakness and limited movement of fingers.  Lymphadenopathy:    He has no cervical adenopathy.  Neurological: He is alert.  Skin: Skin is warm and dry.     Assessment/Plan Ulcerative proctitis. Persistent throat symptoms. EGD possible ED and flexible sigmoidoscopy.  Hildred Laser, MD 01/14/2016, 10:41 AM

## 2016-01-14 NOTE — Op Note (Signed)
[facility] Patient Name: Connor Small Procedure Date: 01/14/2016 10:27 AM MRN: [id number] Date of Birth: 10-20-1960 Attending MD: Lionel December , MD CSN: [id number] Age: 55 Admit Type: Outpatient Procedure:                Upper GI endoscopy Indications:              Throat symptoms. Providers:                Lionel December, MD, Toniann Fail RN, RN, Burke Keels, Technician, Dyann Ruddle Referring MD:             Eartha Inch, Md Medicines:                Cetacaine spray, Meperidine 50 mg IV, Midazolam 4                            mg IV Complications:            No immediate complications. Estimated Blood Loss:     Estimated blood loss: none. Procedure:                Pre-Anesthesia Assessment:                           - Prior to the procedure, a History and Physical                            was performed, and patient medications and                            allergies were reviewed. The patient's tolerance of                            previous anesthesia was also reviewed. The risks                            and benefits of the procedure and the sedation                            options and risks were discussed with the patient.                            All questions were answered, and informed consent                            was obtained. Prior Anticoagulants: The patient has                            taken no previous anticoagulant or antiplatelet                            agents. ASA Grade Assessment: II - A patient with  mild systemic disease. After reviewing the risks                            and benefits, the patient was deemed in                            satisfactory condition to undergo the procedure.                           After obtaining informed consent, the endoscope was                            passed under direct vision. Throughout the                            procedure, the  patient's blood pressure, pulse, and                            oxygen saturations were monitored continuously. The                            [id number] ([id number]) was introduced through the                            mouth, and advanced to the second part of duodenum.                            The upper GI endoscopy was accomplished without                            difficulty. The patient tolerated the procedure                            well. Scope In: 10:58:28 AM Scope Out: 11:04:45 AM Total Procedure Duration: 0 hours 6 minutes 17 seconds  Findings:      A single 12 mm non-bleeding linear erosion was found in the distal       esophagus.      The Z-line was regular and was found 43 cm from the incisors.      Localized mild inflammation characterized by congestion (edema) and       erythema was found at the gastroesophageal junction.      The entire examined stomach was normal.      The duodenal bulb and second portion of the duodenum were normal. Impression:               - A single non-bleeding linear erosion in the                            distal esophagus.                           - Z-line regular, 43 cm from the incisors.                           - Esophageal mucosal  changes were present,                            including congestion (edema) and erythema. Findings                            are suggestive of reflux inflammation.                           - Normal stomach.                           - Normal duodenal bulb and second portion of the                            duodenum.                           - No specimens collected. Moderate Sedation:      Moderate (conscious) sedation was administered by the endoscopy nurse       and supervised by the endoscopist. The following parameters were       monitored: oxygen saturation, heart rate, blood pressure, CO2       capnography and response to care. Total physician intraservice time was       12  minutes. Recommendation:           - Patient has a contact number available for                            emergencies. The signs and symptoms of potential                            delayed complications were discussed with the                            patient. Return to normal activities tomorrow.                            Written discharge instructions were provided to the                            patient.                           - Resume previous diet today.                           - Use Protonix (pantoprazole) 40 mg PO daily.                           - Continue present medications. Procedure Code(s):        --- Professional ---                           [phone], Esophagogastroduodenoscopy, flexible,  transoral; diagnostic, including collection of                            specimen(s) by brushing or washing, when performed                            (separate procedure)                           99152, Moderate sedation services provided by the                            same physician or other qualified health care                            professional performing the diagnostic or                            therapeutic service that the sedation supports,                            requiring the presence of an independent trained                            observer to assist in the monitoring of the                            patient's level of consciousness and physiological                            status; initial 15 minutes of intraservice time,                            patient age 43 years or older Diagnosis Code(s):        --- Professional ---                           K22.10, Ulcer of esophagus without bleeding                           K22.8, Other specified diseases of esophagus CPT copyright 2016 [employer]. All rights reserved. The codes documented in this report are preliminary and upon coder review may  be revised  to meet current compliance requirements. Lionel December, MD Lionel December, MD 01/14/2016 11:31:58 AM This report has been signed electronically. Number of Addenda: 0

## 2016-01-14 NOTE — Op Note (Signed)
[facility] Patient Name: Connor Small Procedure Date: 01/14/2016 11:05 AM MRN: [id number] Date of Birth: 03-25-1960 Attending MD: Lionel December , MD CSN: [id number] Age: 55 Admit Type: Outpatient Procedure:                Flexible Sigmoidoscopy Indications:              Assess disease activity of ulcerative                            proctitis(IBD). Providers:                Lionel December, MD, Toniann Fail RN, RN, Burke Keels, Technician, Dyann Ruddle Referring MD:             Eartha Inch, Md Medicines:                Midazolam 1 mg IV Complications:            No immediate complications. Estimated Blood Loss:     Estimated blood loss was minimal. Procedure:                Pre-Anesthesia Assessment:                           - Prior to the procedure, a History and Physical                            was performed, and patient medications and                            allergies were reviewed. The patient's tolerance of                            previous anesthesia was also reviewed. The risks                            and benefits of the procedure and the sedation                            options and risks were discussed with the patient.                            All questions were answered, and informed consent                            was obtained. Prior Anticoagulants: The patient has                            taken no previous anticoagulant or antiplatelet                            agents. ASA Grade Assessment: II - A patient with  mild systemic disease. After reviewing the risks                            and benefits, the patient was deemed in                            satisfactory condition to undergo the procedure.                           After obtaining informed consent, the scope was                            passed under direct vision. The [id number]                            ([id number])  scope was introduced through the anus and                            advanced to the 20 cm from the anal verge. The                            flexible sigmoidoscopy was accomplished without                            difficulty. The patient tolerated the procedure                            well. The quality of the bowel preparation was                            excellent. Scope In: 11:09:24 AM Scope Out: 11:16:31 AM Total Procedure Duration: 0 hours 7 minutes 7 seconds  Findings:      The perianal and digital rectal examinations were normal.      Inflammation characterized by erosions, friability and loss of       vascularity was found. The rectum was spared. This was mild in severity,       and when compared to previous examinations, the findings are improved.      A benign-appearing, intrinsic moderate ileorectalanastomotic stricture       measuring about 10 mm in diameter was traversed after dilation. A TTS       dilator was passed through the scope. Dilation with a 12-13.5-15 mm       balloon dilator was performed to 12 mm and 13.5 mm. The dilation site       was examined and showed moderate improvement in luminal narrowing. Impression:               - Inflammation was found. Proctitis was mild in                            severity. The findings are improved compared to                            previous examination of December 2016.                           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 Dilated                            to13.5 mm.                           - No specimens collected. Moderate Sedation:      Moderate (conscious) sedation was administered by the endoscopy nurse       and supervised by the endoscopist. The following parameters were       monitored: oxygen saturation, heart rate, blood pressure, CO2       capnography and response to care. Total physician intraservice time was       7 minutes. Recommendation:           - Patient has a contact number  available for                            emergencies. The signs and symptoms of potential                            delayed complications were discussed with the                            patient. Return to normal activities tomorrow.                            Written discharge instructions were provided to the                            patient.                           - Resume previous diet today.                           - Continue present medications.                           - Use Canasa 1000 mg suppository 1 per rectum QHS                            PRN.                           - Continue infliximab infusion every 8 weeks as be Procedure Code(s):        --- Professional ---                           [phone], Sigmoidoscopy, flexible; with                            transendoscopic balloon dilation Diagnosis Code(s):        --- Professional ---                           K52.9, Noninfective gastroenteritis and colitis,  unspecified                           K56.69, Other intestinal obstruction CPT copyright 2016 American [employer] Association. All rights reserved. The codes documented in this report are preliminary and upon coder review may  be revised to meet current compliance requirements. Lionel December, MD Lionel December, MD 01/14/2016 11:43:48 AM This report has been signed electronically. Number of Addenda: 0

## 2016-01-19 ENCOUNTER — Encounter (HOSPITAL_COMMUNITY): Payer: Self-pay | Admitting: Internal Medicine

## 2016-01-19 ENCOUNTER — Encounter (INDEPENDENT_AMBULATORY_CARE_PROVIDER_SITE_OTHER): Payer: Self-pay | Admitting: Internal Medicine

## 2016-01-21 ENCOUNTER — Other Ambulatory Visit (INDEPENDENT_AMBULATORY_CARE_PROVIDER_SITE_OTHER): Payer: Self-pay | Admitting: *Deleted

## 2016-01-21 DIAGNOSIS — K529 Noninfective gastroenteritis and colitis, unspecified: Secondary | ICD-10-CM

## 2016-01-21 MED ORDER — DIPHENOXYLATE-ATROPINE 2.5-0.025 MG PO TABS: 1.0000 | ORAL_TABLET | Freq: Four times a day (QID) | ORAL | 2 refills | Status: DC

## 2016-01-21 NOTE — Telephone Encounter (Signed)
Patient needed a new Rx for this medication.

## 2016-02-19 ENCOUNTER — Encounter (HOSPITAL_COMMUNITY)
Admission: RE | Admit: 2016-02-19 | Discharge: 2016-02-19 | Disposition: A | Discharge status: Home / Self Care | Payer: BLUE CROSS/BLUE SHIELD | Source: Ambulatory Visit | Attending: Internal Medicine | Admitting: Internal Medicine

## 2016-02-24 ENCOUNTER — Encounter (INDEPENDENT_AMBULATORY_CARE_PROVIDER_SITE_OTHER): Payer: Self-pay | Admitting: Internal Medicine

## 2016-02-26 ENCOUNTER — Encounter (HOSPITAL_COMMUNITY)
Admission: RE | Admit: 2016-02-26 | Discharge: 2016-02-26 | Disposition: A | Discharge status: Home / Self Care | Payer: BLUE CROSS/BLUE SHIELD | Source: Ambulatory Visit | Attending: Internal Medicine | Admitting: Internal Medicine

## 2016-02-26 DIAGNOSIS — K5190 Ulcerative colitis, unspecified, without complications: Secondary | ICD-10-CM | POA: Diagnosis not present

## 2016-02-26 MED ORDER — SODIUM CHLORIDE 0.9 % IV SOLN
5.0000 mg/kg | Freq: Once | INTRAVENOUS | Status: AC
  Administered 2016-02-26: 400 mg via INTRAVENOUS
  Filled 2016-02-26: qty 40

## 2016-02-26 MED ORDER — SODIUM CHLORIDE 0.9 % IV SOLN
INTRAVENOUS | Status: DC
  Administered 2016-02-26: 250 mL via INTRAVENOUS

## 2016-03-28 ENCOUNTER — Other Ambulatory Visit (INDEPENDENT_AMBULATORY_CARE_PROVIDER_SITE_OTHER): Payer: Self-pay | Admitting: Internal Medicine

## 2016-03-28 DIAGNOSIS — K529 Noninfective gastroenteritis and colitis, unspecified: Secondary | ICD-10-CM

## 2016-04-22 ENCOUNTER — Encounter (HOSPITAL_COMMUNITY): Payer: BLUE CROSS/BLUE SHIELD

## 2016-04-26 ENCOUNTER — Ambulatory Visit (INDEPENDENT_AMBULATORY_CARE_PROVIDER_SITE_OTHER): Payer: BLUE CROSS/BLUE SHIELD | Admitting: Internal Medicine

## 2016-04-26 ENCOUNTER — Encounter (INDEPENDENT_AMBULATORY_CARE_PROVIDER_SITE_OTHER): Payer: Self-pay | Admitting: Internal Medicine

## 2016-04-26 VITALS — BP 100/68 | HR 66 | Temp 98.2°F | Resp 18 | Ht 70.0 in | Wt 174.4 lb

## 2016-04-26 DIAGNOSIS — K51219 Ulcerative (chronic) proctitis with unspecified complications: Secondary | ICD-10-CM | POA: Diagnosis not present

## 2016-04-26 DIAGNOSIS — K529 Noninfective gastroenteritis and colitis, unspecified: Secondary | ICD-10-CM | POA: Diagnosis not present

## 2016-04-26 DIAGNOSIS — D508 Other iron deficiency anemias: Secondary | ICD-10-CM | POA: Diagnosis not present

## 2016-04-26 MED ORDER — DIPHENOXYLATE-ATROPINE 2.5-0.025 MG PO TABS: ORAL_TABLET | ORAL | 2 refills | Status: DC

## 2016-04-26 MED ORDER — MESALAMINE 1000 MG RE SUPP: 1000.0000 mg | Freq: Every day | RECTAL | 5 refills | Status: DC

## 2016-04-26 NOTE — Progress Notes (Signed)
Presenting complaint;  Follow-up for ulcerative proctitis.  Database and Subjective:  Connor Small is 56 year old Caucasian male was history of ulcerative colitis. He was diagnosed in 2004. He underwent emergency surgery in October 2012 for sigmoid colon perforation. His disease was in remission and cause a perforation could never be determined. He had to have colostomy which was subsequent taken down. Then in January 2015 he developed fulminant C. difficile colitis. Sigmoidoscopy revealed high-grade anastomotic stricture. He underwent emergency subtotal colectomy. He had multiple complications of this illness with prolonged hospitalization. He finally recovered. He was eventually able to have takedown of ileostomy and was connected to rectum. 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  Last flexible sigmoidoscopy was on 01/14/2016 revealing active disease in the rectum but not as pronounced as on prior exam of year earlier. Anastomotic stricture was dilated. He has been maintained on infliximab and Canasa suppositories.  He is here for scheduled visit. He is not having any side effects with infliximab. He is having 6-8 bowel movements per day. He generally has 1 bowel movement 4-5 times each week. He notices blood on only on wiping maybe once a week. He says his appetite is very good. He has not lost any weight. He is still working. He needs new prescription for Canasa suppositories and diphenoxylate.  Current Medications: Outpatient Encounter Prescriptions as of 04/26/2016  Medication Sig  . acetaminophen (TYLENOL) 500 MG tablet Take 500 mg by mouth every 6 (six) hours as needed for moderate pain or headache.  Marland Kitchen aspirin EC 81 MG tablet Take 81 mg by mouth daily.  . clobetasol cream (TEMOVATE) 7.20 % Apply 1 application topically daily as needed for dry skin.  Marland Kitchen diphenoxylate-atropine (LOMOTIL) 2.5-0.025 MG tablet TAKE (1) TABLET BY MOUTH (4) TIMES DAILY.  . ferrous sulfate  325 (65 FE) MG tablet Take 325 mg by mouth 2 (two) times daily with a meal.  . fluticasone (FLONASE) 50 MCG/ACT nasal spray Place 1 spray into both nostrils daily as needed for allergies or rhinitis.  . InFLIXimab (REMICADE IV) Inject into the vein. Next infusion 05/15/2015. Patient is infused every 8 weeks.  . mesalamine (CANASA) 1000 MG suppository Place 1 suppository (1,000 mg total) rectally at bedtime. (Patient taking differently: Place 1,000 mg rectally 3 (three) times a week. Mon, Wed, and Fri)  . mesalamine (CANASA) 1000 MG suppository Place 1 suppository (1,000 mg total) rectally at bedtime.  . Multiple Minerals-Vitamins (CITRACAL PLUS PO) Take 1 tablet by mouth 2 (two) times daily.  . Multiple Vitamin (MULTIVITAMIN WITH MINERALS) TABS tablet Take 1 tablet by mouth daily.  Marland Kitchen saccharomyces boulardii (FLORASTOR) 250 MG capsule Take 250 mg by mouth daily.   . vitamin C (ASCORBIC ACID) 500 MG tablet Take 500 mg by mouth daily.  . pantoprazole (PROTONIX) 40 MG tablet Take 1 tablet (40 mg total) by mouth daily before breakfast. (Patient not taking: Reported on 04/26/2016)   No facility-administered encounter medications on file as of 04/26/2016.      Objective: Blood pressure 100/68, pulse 66, temperature 98.2 F (36.8 C), temperature source Oral, resp. rate 18, height 5' 10"  (1.778 m), weight 174 lb 6.4 oz (79.1 kg). Patient is alert and in no acute distress. Conjunctiva is pink. Sclera is nonicteric Oropharyngeal mucosa is normal. No neck masses or thyromegaly noted. Cardiac exam with regular rhythm normal S1 and S2. No murmur or gallop noted. Lungs are clear to auscultation. Abdomen he has multiple scars. He has his incisional hernia in lower  abdomen. Abdomen is soft and nontender without organomegaly or masses. No LE edema or clubbing noted. He has limited flexion to fingers of his right hand with weak grip.  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 His disease was not in remission then he underwent flexible sigmoidoscopy in December 2017. He also has developed ileorectal anastomotic stricture. This stricture was dilated on his last sigmoidoscopy. He does not appear to be having any symptoms to suggest progression of this stricture. Will check CRP. If it remains elevated will consider infliximab/antibody levels before dose escalated. #2. Anemia. Will check CBC.. #3. Incisional hernia at the lower abdomen. He is not having any symptoms. He being followed by Dr. Adonis Housekeeper.   Plan:  New prescription given for diphenoxylate up to 4 tablets per day. On month with 2 refills. New prescription given for Canasa suppository. CBC and CRP. Office visit in 6 months.

## 2016-04-26 NOTE — Patient Instructions (Signed)
Physician will call with results of blood tests when completed.

## 2016-04-27 ENCOUNTER — Encounter (INDEPENDENT_AMBULATORY_CARE_PROVIDER_SITE_OTHER): Payer: Self-pay | Admitting: Internal Medicine

## 2016-04-29 ENCOUNTER — Encounter (HOSPITAL_COMMUNITY)
Admission: RE | Admit: 2016-04-29 | Discharge: 2016-04-29 | Disposition: A | Discharge status: Home / Self Care | Payer: BLUE CROSS/BLUE SHIELD | Source: Ambulatory Visit | Attending: Internal Medicine | Admitting: Internal Medicine

## 2016-04-29 DIAGNOSIS — K5190 Ulcerative colitis, unspecified, without complications: Secondary | ICD-10-CM | POA: Insufficient documentation

## 2016-04-29 LAB — CBC
HEMATOCRIT: 34.4 % — AB (ref 39.0–52.0)
Hemoglobin: 11.9 g/dL — ABNORMAL LOW (ref 13.0–17.0)
MCH: 32.5 pg (ref 26.0–34.0)
MCHC: 34.6 g/dL (ref 30.0–36.0)
MCV: 94 fL (ref 78.0–100.0)
Platelets: 190 10*3/uL (ref 150–400)
RBC: 3.66 MIL/uL — AB (ref 4.22–5.81)
RDW: 12.6 % (ref 11.5–15.5)
WBC: 6.6 10*3/uL (ref 4.0–10.5)

## 2016-04-29 LAB — C-REACTIVE PROTEIN: CRP: 0.8 mg/dL (ref <1.0)

## 2016-04-29 MED ORDER — SODIUM CHLORIDE 0.9 % IV SOLN
Freq: Once | INTRAVENOUS | Status: AC
  Administered 2016-04-29: 13:00:00 via INTRAVENOUS

## 2016-04-29 MED ORDER — DIPHENHYDRAMINE HCL 25 MG PO CAPS: 25.0000 mg | ORAL_CAPSULE | Freq: Once | ORAL | Status: DC

## 2016-04-29 MED ORDER — ACETAMINOPHEN 325 MG PO TABS: 650.0000 mg | ORAL_TABLET | Freq: Once | ORAL | Status: DC

## 2016-04-29 MED ORDER — SODIUM CHLORIDE 0.9 % IV SOLN
5.0000 mg/kg | Freq: Once | INTRAVENOUS | Status: AC
  Administered 2016-04-29: 400 mg via INTRAVENOUS
  Filled 2016-04-29: qty 40

## 2016-05-02 NOTE — Progress Notes (Signed)
Results for CAMILLO, QUADROS (MRN [id number]) as of 05/02/2016 13:37  Ref. Range 04/29/2016 13:11  CRP Latest Ref Range: <1.0 mg/dL 0.8  WBC Latest Ref Range: 4.0 - 10.5 K/uL 6.6  RBC Latest Ref Range: 4.22 - 5.81 MIL/uL 3.66 (L)  Hemoglobin Latest Ref Range: 13.0 - 17.0 g/dL 11.9 (L)  HCT Latest Ref Range: 39.0 - 52.0 % 34.4 (L)  MCV Latest Ref Range: 78.0 - 100.0 fL 94.0  MCH Latest Ref Range: 26.0 - 34.0 pg 32.5  MCHC Latest Ref Range: 30.0 - 36.0 g/dL 34.6  RDW Latest Ref Range: 11.5 - 15.5 % 12.6  Platelets Latest Ref Range: 150 - 400 K/uL 190

## 2016-06-13 ENCOUNTER — Encounter (INDEPENDENT_AMBULATORY_CARE_PROVIDER_SITE_OTHER): Payer: Self-pay | Admitting: *Deleted

## 2016-06-13 ENCOUNTER — Encounter (INDEPENDENT_AMBULATORY_CARE_PROVIDER_SITE_OTHER): Payer: Self-pay | Admitting: Internal Medicine

## 2016-06-24 ENCOUNTER — Encounter (HOSPITAL_COMMUNITY): Payer: Self-pay

## 2016-06-24 ENCOUNTER — Encounter (HOSPITAL_COMMUNITY)
Admission: RE | Admit: 2016-06-24 | Discharge: 2016-06-24 | Disposition: A | Discharge status: Home / Self Care | Payer: BLUE CROSS/BLUE SHIELD | Source: Ambulatory Visit | Attending: Internal Medicine | Admitting: Internal Medicine

## 2016-06-24 DIAGNOSIS — K5190 Ulcerative colitis, unspecified, without complications: Secondary | ICD-10-CM | POA: Diagnosis present

## 2016-06-24 MED ORDER — SODIUM CHLORIDE 0.9 % IV SOLN
5.0000 mg/kg | Freq: Once | INTRAVENOUS | Status: AC
  Administered 2016-06-24: 400 mg via INTRAVENOUS
  Filled 2016-06-24: qty 40

## 2016-06-24 MED ORDER — SODIUM CHLORIDE 0.9 % IV SOLN
INTRAVENOUS | Status: DC
  Administered 2016-06-24: 13:00:00 via INTRAVENOUS

## 2016-08-04 ENCOUNTER — Encounter (INDEPENDENT_AMBULATORY_CARE_PROVIDER_SITE_OTHER): Payer: Self-pay | Admitting: Internal Medicine

## 2016-10-25 ENCOUNTER — Ambulatory Visit (INDEPENDENT_AMBULATORY_CARE_PROVIDER_SITE_OTHER): Payer: BLUE CROSS/BLUE SHIELD | Admitting: Internal Medicine

## 2016-10-25 ENCOUNTER — Encounter (INDEPENDENT_AMBULATORY_CARE_PROVIDER_SITE_OTHER): Payer: Self-pay | Admitting: Internal Medicine

## 2016-10-25 VITALS — BP 108/60 | HR 68 | Temp 98.2°F | Resp 18 | Ht 70.0 in | Wt 169.7 lb

## 2016-10-25 DIAGNOSIS — K529 Noninfective gastroenteritis and colitis, unspecified: Secondary | ICD-10-CM

## 2016-10-25 DIAGNOSIS — D508 Other iron deficiency anemias: Secondary | ICD-10-CM

## 2016-10-25 DIAGNOSIS — K51219 Ulcerative (chronic) proctitis with unspecified complications: Secondary | ICD-10-CM

## 2016-10-25 MED ORDER — MESALAMINE 1000 MG RE SUPP: 1000.0000 mg | Freq: Every day | RECTAL | 5 refills | Status: DC

## 2016-10-25 MED ORDER — DIPHENOXYLATE-ATROPINE 2.5-0.025 MG PO TABS: ORAL_TABLET | ORAL | 5 refills | Status: DC

## 2016-10-25 NOTE — Patient Instructions (Signed)
Physician will call with results of blood work when completed. Flexible sigmoidoscopy to be scheduled in December 2018.

## 2016-10-25 NOTE — Progress Notes (Signed)
Presenting complaint;  Follow-up for ulcerative proctitis diarrhea and anemia.  Subjective:  Patient is 56 year old Caucasian male who has about 14 year history of ulcerative colitis whose colon was removed because of fulminant C. difficile who has rectum in situ and is here for scheduled visit. He was last seen 6 months ago. He remains with 6-8 bowel movements per day. Consistency varies from loose to semi-formed and occasionally to formed stool. He notices blood in the tissue 2-3 times a month. He has soreness at night and lower abdomen at times. He denies nausea or vomiting. He has good appetite. His weight has decreased by 5 pounds. He says this generally happens during summer. Only side effect he has with Remicade is achy feeling for few days. He feels Remicade works well for about 7 weeks. He is working full-time. He is able to perform most tasks with his right hand despite deformity..  Current Medications: Outpatient Encounter Prescriptions as of 10/25/2016  Medication Sig  . acetaminophen (TYLENOL) 500 MG tablet Take 500 mg by mouth every 6 (six) hours as needed for moderate pain or headache.  Marland Kitchen aspirin EC 81 MG tablet Take 81 mg by mouth daily.  . diphenoxylate-atropine (LOMOTIL) 2.5-0.025 MG tablet TAKE (1) TABLET BY MOUTH (4) TIMES DAILY.  . ferrous sulfate 325 (65 FE) MG tablet Take 325 mg by mouth 2 (two) times daily with a meal.  . fluticasone (FLONASE) 50 MCG/ACT nasal spray Place 1 spray into both nostrils daily as needed for allergies or rhinitis.  . InFLIXimab (REMICADE IV) Inject into the vein. Next infusion 05/15/2015. Patient is infused every 8 weeks.  . mesalamine (CANASA) 1000 MG suppository Place 1 suppository (1,000 mg total) rectally at bedtime.  . Multiple Minerals-Vitamins (CITRACAL PLUS PO) Take 1 tablet by mouth 2 (two) times daily.  . Multiple Vitamin (MULTIVITAMIN WITH MINERALS) TABS tablet Take 1 tablet by mouth daily.  Marland Kitchen saccharomyces boulardii (FLORASTOR) 250  MG capsule Take 250 mg by mouth daily.   . vitamin C (ASCORBIC ACID) 500 MG tablet Take 500 mg by mouth daily.  . [DISCONTINUED] mesalamine (CANASA) 1000 MG suppository Place 1 suppository (1,000 mg total) rectally at bedtime. (Patient not taking: Reported on 10/25/2016)   No facility-administered encounter medications on file as of 10/25/2016.      Objective: Blood pressure 108/60, pulse 68, temperature 98.2 F (36.8 C), temperature source Oral, resp. rate 18, height 5' 10"  (1.778 m), weight 169 lb 11.2 oz (77 kg). Patient is alert and in no acute distress. Conjunctiva is pink. Sclera is nonicteric Oropharyngeal mucosa is normal. No neck masses or thyromegaly noted. Cardiac exam with regular rhythm normal S1 and S2. No murmur or gallop noted. Lungs are clear to auscultation. Abdomen he has multiple scars. He has lower midline incisional hernia. Abdomen is soft and nontender without organomegaly or masses. No LE edema or clubbing noted. He has limited flexion to fingers of his right hand.  Labs/studies Results:   H&H was 11.9 and 34.4 on 04/29/2016.  Assessment:  #1. Ulcerative proctitis. He is on infliximab and Canasa suppositories. Sigmoidoscopy in September 2017 revealed significant but incomplete healing of proctitis and he also had ileorectal anastomotic stricture dilation. He should undergo follow-up exam to document complete healing and also to reassess stricture before it becomes symptomatic.  #2. 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 Stool frequency has gradually decreased. He is on diphenoxylate on schedule.  #3. History of iron deficiency anemia.   Plan:  Diagnostic flexible sigmoidoscopy  in December 2018. Patient will go the lab for CBC and CRP. New prescription given for Canasa suppositories and diphenoxylate. Office visit in 6 months.

## 2016-10-26 ENCOUNTER — Encounter (INDEPENDENT_AMBULATORY_CARE_PROVIDER_SITE_OTHER): Payer: Self-pay | Admitting: Internal Medicine

## 2016-10-27 ENCOUNTER — Encounter (INDEPENDENT_AMBULATORY_CARE_PROVIDER_SITE_OTHER): Payer: Self-pay | Admitting: *Deleted

## 2016-10-28 LAB — CBC
HCT: 35.2 % — ABNORMAL LOW (ref 38.5–50.0)
HEMOGLOBIN: 12 g/dL — AB (ref 13.2–17.1)
MCH: 31.7 pg (ref 27.0–33.0)
MCHC: 34.1 g/dL (ref 32.0–36.0)
MCV: 92.9 fL (ref 80.0–100.0)
MPV: 9.7 fL (ref 7.5–12.5)
Platelets: 189 10*3/uL (ref 140–400)
RBC: 3.79 10*6/uL — AB (ref 4.20–5.80)
RDW: 12.2 % (ref 11.0–15.0)
WBC: 6.5 10*3/uL (ref 3.8–10.8)

## 2016-10-28 LAB — C-REACTIVE PROTEIN: CRP: 2.6 mg/L (ref <8.0)

## 2016-11-14 ENCOUNTER — Encounter (INDEPENDENT_AMBULATORY_CARE_PROVIDER_SITE_OTHER): Payer: Self-pay | Admitting: Internal Medicine

## 2016-11-18 ENCOUNTER — Other Ambulatory Visit (INDEPENDENT_AMBULATORY_CARE_PROVIDER_SITE_OTHER): Payer: Self-pay | Admitting: *Deleted

## 2016-11-18 ENCOUNTER — Encounter (INDEPENDENT_AMBULATORY_CARE_PROVIDER_SITE_OTHER): Payer: Self-pay | Admitting: *Deleted

## 2016-11-18 DIAGNOSIS — K51219 Ulcerative (chronic) proctitis with unspecified complications: Secondary | ICD-10-CM | POA: Insufficient documentation

## 2016-11-21 ENCOUNTER — Other Ambulatory Visit (INDEPENDENT_AMBULATORY_CARE_PROVIDER_SITE_OTHER): Payer: Self-pay | Admitting: Internal Medicine

## 2016-11-24 ENCOUNTER — Encounter (INDEPENDENT_AMBULATORY_CARE_PROVIDER_SITE_OTHER): Payer: Self-pay | Admitting: *Deleted

## 2017-01-19 ENCOUNTER — Encounter (HOSPITAL_COMMUNITY): Payer: Self-pay

## 2017-01-19 ENCOUNTER — Ambulatory Visit (HOSPITAL_COMMUNITY)
Admission: RE | Admit: 2017-01-19 | Discharge: 2017-01-19 | Disposition: A | Discharge status: Home / Self Care | Payer: BLUE CROSS/BLUE SHIELD | Source: Ambulatory Visit | Attending: Internal Medicine | Admitting: Internal Medicine

## 2017-01-19 ENCOUNTER — Other Ambulatory Visit: Payer: Self-pay

## 2017-01-19 ENCOUNTER — Encounter (HOSPITAL_COMMUNITY)
Admission: RE | Disposition: A | Discharge status: Home / Self Care | Payer: Self-pay | Source: Ambulatory Visit | Attending: Internal Medicine

## 2017-01-19 DIAGNOSIS — Z8673 Personal history of transient ischemic attack (TIA), and cerebral infarction without residual deficits: Secondary | ICD-10-CM | POA: Diagnosis not present

## 2017-01-19 DIAGNOSIS — Z86718 Personal history of other venous thrombosis and embolism: Secondary | ICD-10-CM | POA: Insufficient documentation

## 2017-01-19 DIAGNOSIS — Z91041 Radiographic dye allergy status: Secondary | ICD-10-CM | POA: Diagnosis not present

## 2017-01-19 DIAGNOSIS — Z980 Intestinal bypass and anastomosis status: Secondary | ICD-10-CM | POA: Diagnosis not present

## 2017-01-19 DIAGNOSIS — Z79899 Other long term (current) drug therapy: Secondary | ICD-10-CM | POA: Insufficient documentation

## 2017-01-19 DIAGNOSIS — K5939 Other megacolon: Secondary | ICD-10-CM | POA: Diagnosis not present

## 2017-01-19 DIAGNOSIS — K51219 Ulcerative (chronic) proctitis with unspecified complications: Secondary | ICD-10-CM | POA: Insufficient documentation

## 2017-01-19 DIAGNOSIS — Z880 Allergy status to penicillin: Secondary | ICD-10-CM | POA: Insufficient documentation

## 2017-01-19 DIAGNOSIS — K624 Stenosis of anus and rectum: Secondary | ICD-10-CM | POA: Insufficient documentation

## 2017-01-19 DIAGNOSIS — Z885 Allergy status to narcotic agent status: Secondary | ICD-10-CM | POA: Diagnosis not present

## 2017-01-19 DIAGNOSIS — Z888 Allergy status to other drugs, medicaments and biological substances status: Secondary | ICD-10-CM | POA: Insufficient documentation

## 2017-01-19 DIAGNOSIS — K5120 Ulcerative (chronic) proctitis without complications: Secondary | ICD-10-CM | POA: Diagnosis present

## 2017-01-19 DIAGNOSIS — Z09 Encounter for follow-up examination after completed treatment for conditions other than malignant neoplasm: Secondary | ICD-10-CM | POA: Diagnosis not present

## 2017-01-19 DIAGNOSIS — K633 Ulcer of intestine: Secondary | ICD-10-CM | POA: Diagnosis not present

## 2017-01-19 DIAGNOSIS — Z9049 Acquired absence of other specified parts of digestive tract: Secondary | ICD-10-CM | POA: Diagnosis not present

## 2017-01-19 DIAGNOSIS — Z7982 Long term (current) use of aspirin: Secondary | ICD-10-CM | POA: Insufficient documentation

## 2017-01-19 DIAGNOSIS — Z87891 Personal history of nicotine dependence: Secondary | ICD-10-CM | POA: Insufficient documentation

## 2017-01-19 DIAGNOSIS — Z87442 Personal history of urinary calculi: Secondary | ICD-10-CM | POA: Insufficient documentation

## 2017-01-19 DIAGNOSIS — I480 Paroxysmal atrial fibrillation: Secondary | ICD-10-CM | POA: Diagnosis not present

## 2017-01-19 HISTORY — PX: BIOPSY: SHX5522

## 2017-01-19 HISTORY — PX: FLEXIBLE SIGMOIDOSCOPY: SHX5431

## 2017-01-19 SURGERY — SIGMOIDOSCOPY, FLEXIBLE
Anesthesia: Moderate Sedation

## 2017-01-19 MED ORDER — MIDAZOLAM HCL 5 MG/5ML IJ SOLN
INTRAMUSCULAR | Status: AC
  Filled 2017-01-19: qty 10

## 2017-01-19 MED ORDER — SODIUM CHLORIDE 0.9 % IV SOLN
INTRAVENOUS | Status: DC
  Administered 2017-01-19: 11:00:00 via INTRAVENOUS

## 2017-01-19 MED ORDER — MEPERIDINE HCL 50 MG/ML IJ SOLN
INTRAMUSCULAR | Status: AC
  Filled 2017-01-19: qty 1

## 2017-01-19 MED ORDER — MIDAZOLAM HCL 5 MG/5ML IJ SOLN
INTRAMUSCULAR | Status: DC | PRN
  Administered 2017-01-19: 2 mg via INTRAVENOUS
  Administered 2017-01-19: 1 mg via INTRAVENOUS
  Administered 2017-01-19: 2 mg via INTRAVENOUS

## 2017-01-19 MED ORDER — STERILE WATER FOR IRRIGATION IR SOLN
Status: DC | PRN
  Administered 2017-01-19: 12:00:00

## 2017-01-19 MED ORDER — MEPERIDINE HCL 25 MG/ML IJ SOLN
INTRAMUSCULAR | Status: DC | PRN
  Administered 2017-01-19 (×2): 25 mg via INTRAVENOUS

## 2017-01-19 NOTE — Op Note (Signed)
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 Patsy Lager, RN, Edrick Kins, RN Referring MD:             Eartha Inch, MD Medicines:                Meperidine 50 mg IV, Midazolam 5 mg IV Complications:            No immediate complications. Estimated Blood Loss:     Estimated blood loss was minimal. Procedure:                Pre-Anesthesia Assessment:                           - Prior to the procedure, a History and Physical                            was performed, and patient medications and                            allergies were reviewed. The patient's tolerance of                            previous anesthesia was also reviewed. The risks                            and benefits of the procedure and the sedation                            options and risks were discussed with the patient.                            All questions were answered, and informed consent                            was obtained. Prior Anticoagulants: The patient                            last took aspirin 3 days prior to the procedure.                            ASA Grade Assessment: II - A patient with mild  systemic disease. After reviewing the risks and                            benefits, the patient was deemed in satisfactory                            condition to undergo the procedure.                           After obtaining informed consent, the scope was                            passed under direct vision. The [id number]                            ([id number]) scope was  introduced through the anus and                            advanced to the the ileo-rectal anastomosis. The                            flexible sigmoidoscopy was accomplished without                            difficulty. The patient tolerated the procedure                            well. The quality of the bowel preparation was good. Scope In: 11:59:14 AM Scope Out: 12:12:20 PM Total Procedure Duration: 0 hours 13 minutes 6 seconds  Findings:      The perianal and digital rectal examinations were normal.      The ileum proximal to anastamosis appeared normal.      Highgrade ileorectal anastamotic stricture traversed after dilation with       balloon from 10 mm to 11 mm and finally to 12 mm.      Diffuse changes of proctitis with ulcerated mucosa was present in the       rectum. Biopsies were taken with a cold forceps for histology. Suture       material noted. Impression:               - The terminal ileum is normal proximal to                            anastamosis.                           - Highgrade ileo-rectal anastamotic stricture.                            Dilated.                           - Diffuse proctitis. Biopsied. Moderate Sedation:      Moderate (conscious) sedation was administered by the endoscopy nurse       and supervised by the endoscopist. The following parameters were  monitored: oxygen saturation, heart rate, blood pressure, CO2       capnography and response to care. Total physician intraservice time was       19 minutes. Recommendation:           - Discharge patient to home (with spouse).                           - Resume previous diet today.                           - Continue present medications.                           - No aspirin, ibuprofen, naproxen, or other                            non-steroidal anti-inflammatory drugs for 1 day.                           - Await pathology results.                           - Repeat flexible  sigmoidoscopy PRN. Procedure Code(s):        --- Professional ---                           [phone], Sigmoidoscopy, flexible; with biopsy, single                            or multiple                           99152, Moderate sedation services provided by the                            same physician or other qualified health care                            professional performing the diagnostic or                            therapeutic service that the sedation supports,                            requiring the presence of an independent trained                            observer to assist in the monitoring of the                            patient's level of consciousness and physiological                            status; initial 15 minutes of intraservice time,  patient age 79 years or older Diagnosis Code(s):        --- Professional ---                           K59.39, Other megacolon                           K63.3, Ulcer of intestine                           K51.20, Ulcerative (chronic) proctitis without                            complications CPT copyright 2016 American Medical Association. All rights reserved. The codes documented in this report are preliminary and upon coder review may  be revised to meet current compliance requirements. Lionel December, MD Lionel December, MD 01/19/2017 12:33:02 PM This report has been signed electronically. Number of Addenda: 0

## 2017-01-19 NOTE — Discharge Instructions (Signed)
Resume usual medications and diet as before. No driving for 24 hours. Physician will call with biopsy results and further recommendations.       Flexible Sigmoidoscopy, Care After This sheet gives you information about how to care for yourself after your procedure. Your health care provider may also give you more specific instructions. If you have problems or questions, contact your health care provider. What can I expect after the procedure? After the procedure, it is common to have:  Abdominal cramping or pain.  Bloating.  A small amount of rectal bleeding if you had a biopsy.  Follow these instructions at home:  Take over-the-counter and prescription medicines only as told by your health care provider.  Do not drive for 24 hours if you received a medicine to help you relax (sedative).  Keep all follow-up visits as told by your health care provider. This is important. Contact a health care provider if:  You have abdominal pain or cramping that gets worse or is not helped with medicine.  You continue to have small amounts of rectal bleeding after 24 hours.  You have nausea or vomiting.  You feel weak or dizzy.  You have a fever. Get help right away if:  You pass large blood clots or see a large amount of blood in the toilet after having a bowel movement.  You have nausea or vomiting for more than 24 hours after the procedure. This information is not intended to replace advice given to you by your health care provider. Make sure you discuss any questions you have with your health care provider. Document Released: 01/15/2013 Document Revised: 07/31/2015 Document Reviewed: 04/11/2015 Elsevier Interactive Patient Education  2018 [place]      [place], Miguel Dibble, RN  Registered Nurse  Ambulatory Surgery  Progress Notes  Signed  Date of Service:  01/19/2017 12:46 PM          Signed           [] Hide copied text  [] Hover for details   Please  excuse Connor Small from work 12/27 and 01/20/2017. 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

## 2017-01-19 NOTE — Progress Notes (Signed)
Please excuse Connor Small from work 12/27 and 01/20/2017. 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

## 2017-01-19 NOTE — H&P (Signed)
Connor Small is an 56 y.o. male.   Chief Complaint: Patient is here for flexible sigmoidoscopy. 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  He is on infliximab every 8 weeks and Canasa suppositories 1 per rectum at bedtime.  Last exam was 1 year ago revealing active disease.  He remains with 6-8 bowel movements per day.  Stool consistency varies between loose to occasionally formed.  He notices blood on the tissue but has not had frank bleeding.  He denies abdominal pain.  He is undergoing examination to assess disease activity.  On his last exam he was noted to have an anastomotic stricture.  If this is felt to be critical it would be dilated.  Past Medical History:  Diagnosis Date  . Allergic rhinitis 06/28/2012  . Ascites s/p US guide paracentesis (4/2) 04/25/2012  . Aspiration pneumonitis ([place]) 04/26/2012  . Chronic diarrhea   . Clostridium difficile colitis 04/26/2012  . 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 HAP (hospital-acquired pneumonia) 04/21/2012  . Hemorrhoids   . History of acute renal failure    post-op  total colectomy 02-27-2012--  required dialysis--  resolved  . History of ARDS    02-27-2012  post op total colectomy -- septic shock-  required intubation/ventilator for 3-4 wks  . History of atrial fibrillation    02/ 2014--  no issues since  . History of Clostridium difficile colitis    02-27-2012--  fulminant C.Diff colitis caused toxic megacolon--  s/p  total colectomy  . History of DVT (deep vein thrombosis)    2014--  right upper extremity  . History of pleural effusion    acute- bilateral in setting septic shock post op Feb 2014  . History of septic shock dx's at this time--  ARDS, ARF, CVA, A-Fib   Feb 2014--  post op total colectomy from toxic megacolon/ fulminant C.Diff Colitis  . History of stroke March 2014 --  residual right weaker than prior to cva, unable to  make fist   Bilateral cerebral and right cerebellar infarcts - felt to be embolic vs hypotensive (in setting of septic shock, transient a-fib, ARDS)  . PAF (paroxysmal atrial fibrillation) ([place]) 03/15/2012  . Rectal bleed   . Renal calculi    bilateral--  left > right  . Ulcerative colitis followed by dr Laural Golden   Distal UC dx 2004  . Urgency of urination     Past Surgical History:  Procedure Laterality Date  . APPLICATION OF WOUND VAC N/A 03/26/2012   Procedure: APPLICATION OF WOUND VAC;  Surgeon: Edward Jolly, MD;  Location: [place];  Service: General;  Laterality: N/A;  . CECOSTOMY  02/27/2012   Procedure: CECOSTOMY;  Surgeon: Donato Heinz, MD;  [facility];  Service: General;  Laterality: N/A;  Cecostomy Tube Placement  . COLECTOMY  02/28/2012   Procedure: TOTAL COLECTOMY;  Surgeon: Donato Heinz, MD;  Location: [facility];  Service: General;  Laterality: N/A;  . COLONOSCOPY  02/16/2011   Procedure: COLONOSCOPY;  Surgeon: Rogene Houston, MD;  [facility];  Service: Endoscopy;  Laterality: N/A;  100  . CYSTOSCOPY W/ URETERAL STENT PLACEMENT Bilateral 03/20/2012   Procedure: CYSTOSCOPY WITH RETROGRADE PYELOGRAM/;  Surgeon: Alexis Frock, MD;  Location: [place];  Service: Urology;  Laterality: Bilateral. Pt reports no stents were placed.  . CYSTOSCOPY WITH RETROGRADE PYELOGRAM, URETEROSCOPY AND STENT PLACEMENT Left 07/16/2014  Procedure: CYSTOSCOPY WITH RETROGRADE PYELOGRAM,  AND LEFT STENT PLACEMENT;  Surgeon: Alexis Frock, MD;  Location: [facility];  Service: Urology;  Laterality: Left;  . CYSTOSCOPY WITH RETROGRADE PYELOGRAM, URETEROSCOPY AND STENT PLACEMENT Bilateral 07/25/2014   Procedure: CYSTOSCOPY WITH RETROGRADE PYELOGRAM, URETEROSCOPY AND BILATERAL STENT PLACEMENT;  Surgeon: Alexis Frock, MD;  Location[facility];  Service: Urology;  Laterality: Bilateral;  . ESOPHAGOGASTRODUODENOSCOPY N/A 01/14/2016   Procedure: ESOPHAGOGASTRODUODENOSCOPY (EGD);   Surgeon: Rogene Houston, MD;  [facility];  Service: Endoscopy;  Laterality: N/A;  . FLEXIBLE SIGMOIDOSCOPY  02/27/2012   Procedure: FLEXIBLE SIGMOIDOSCOPY;  Surgeon: Rogene Houston, MD;  [facility];  Service: Endoscopy;  Laterality: N/A;  with colonic decompression  . FLEXIBLE SIGMOIDOSCOPY N/A 02/06/2013   Procedure: FLEXIBLE SIGMOIDOSCOPY;  Surgeon: Rogene Houston, MD;  [facility];  Service: Endoscopy;  Laterality: N/A;  100-moved to 1200 Ann notified pt  . FLEXIBLE SIGMOIDOSCOPY N/A 03/27/2013   Procedure: FLEXIBLE SIGMOIDOSCOPY;  Surgeon: Rogene Houston, MD;  [facility];  Service: Endoscopy;  Laterality: N/A;  225  . FLEXIBLE SIGMOIDOSCOPY N/A 12/11/2013   Procedure: FLEXIBLE SIGMOIDOSCOPY;  Surgeon: Rogene Houston, MD;  [facility];  Service: Endoscopy;  Laterality: N/A;  200  . FLEXIBLE SIGMOIDOSCOPY N/A 01/07/2015   Procedure: FLEXIBLE SIGMOIDOSCOPY;  Surgeon: Rogene Houston, MD;  [facility];  Service: Endoscopy;  Laterality: N/A;  830  . FLEXIBLE SIGMOIDOSCOPY N/A 01/14/2016   Procedure: FLEXIBLE SIGMOIDOSCOPY;  Surgeon: Rogene Houston, MD;  [facility];  Service: Endoscopy;  Laterality: N/A;  . HOLMIUM LASER APPLICATION Left 4/0/9811   Procedure: HOLMIUM LASER APPLICATION, LEFT ONLY ;  Surgeon: Alexis Frock, MD;  Location: [facility];  Service: Urology;  Laterality: Left;  . ILEOSTOMY  02/28/2012   Procedure: ILEOSTOMY;  Surgeon: Donato Heinz, MD;  [facility];  Service: General;  Laterality: N/A;  . ILEOSTOMY CLOSURE N/A 05/27/2013   Procedure: ILEOSTOMY TAKEDOWN WITH ILEOPROCTOSTOMY;  Surgeon: Edward Jolly, MD;  [facility];  Service: General;  Laterality: N/A;  . LAPAROTOMY  02/27/2012   Procedure: EXPLORATORY LAPAROTOMY;  Surgeon: Donato Heinz, MD;  Location[facility];  Service: General;  Laterality: N/A;  . LAPAROTOMY N/A 03/26/2012   Procedure: EXPLORATORY  LAPAROTOMY;  Surgeon: Edward Jolly, MD;  Location: [place];  Service: General;  Laterality: N/A;  . LAPAROTOMY N/A 03/29/2012   Procedure: EXPLORATORY LAPAROTOMY, PARTIAL WOUND CLOSURE;  Surgeon: Edward Jolly, MD;  Location: [place];  Service: General;  Laterality: N/A;  . LIVER BIOPSY N/A 03/26/2012   Procedure: LIVER BIOPSY;  Surgeon: Edward Jolly, MD;  Location: [place];  Service: General;  Laterality: N/A;  . SIGMOID COLECTOMY /  LOW ANTERIOR RESECTION/  END SIGMOID COLOSTOMY  11-07-2010   [employer] [facility] [employer]   Sigmoid colonic perferation---  TAKE DOWN COLOSTOMY,  Jan 2013  . TRANSTHORACIC ECHOCARDIOGRAM  03-26-2012  (in setting of septic shock)   report states  poorly visualized--  normal LVSF, ef 50-55% and no pericardial effusion  . VACUUM ASSISTED CLOSURE CHANGE N/A 03/29/2012   Procedure: Open ABDOMINAL VACUUM  CHANGE;  Surgeon: Edward Jolly, MD;  Location: [place];  Service: General;  Laterality: N/A;  . VACUUM ASSISTED CLOSURE CHANGE N/A 04/01/2012   Procedure: removal of abdominal vac dressing and abdominal closure;  Surgeon: Edward Jolly, MD;  Location: [place];  Service: General;  Laterality:  N/A;    Family History  Problem Relation Age of Onset  . Cancer Sister   . Healthy Daughter   . Hypertension Mother   . Colon cancer Neg Hx    Social History:  reports that he quit smoking about 32 years ago. His smoking use included cigarettes. He has a 6.00 pack-year smoking history. he has never used smokeless tobacco. He reports that he does not drink alcohol or use drugs.  Allergies:  Allergies  Allergen Reactions  . Penicillins Other (See Comments)    "Heart rate slowed down"  bradycardia Has patient had a PCN reaction causing immediate rash, facial/tongue/throat swelling, SOB or lightheadedness with hypotension: Yes Has patient had a PCN reaction causing severe rash involving mucus membranes or skin necrosis: No Has patient had a PCN reaction that required  hospitalization Yes Has patient had a PCN reaction occurring within the last 10 years: Yes If all of the above answers are "NO", then may proceed with Cephalosporin use.   . Clindamycin/Lincomycin Diarrhea    C-diff  . Morphine And Related Nausea And Vomiting  . Claritin [Loratadine] Rash  . Contrast Media [Iodinated Diagnostic Agents] Hives    Patient reports hives after IV contrast.   . Primaxin [Imipenem] Rash    Medications Prior to Admission  Medication Sig Dispense Refill  . acetaminophen (TYLENOL) 500 MG tablet Take 500 mg by mouth every 6 (six) hours as needed for moderate pain or headache.    Marland Kitchen aspirin EC 81 MG tablet Take 81 mg by mouth daily.    Marland Kitchen CANASA 1000 MG suppository PLACE 1 SUPPOSITORY RECTALLY AT BEDTIME. 30 suppository 5  . diphenoxylate-atropine (LOMOTIL) 2.5-0.025 MG tablet TAKE (1) TABLET BY MOUTH (4) TIMES DAILY. 120 tablet 5  . ferrous sulfate 325 (65 FE) MG tablet Take 325 mg by mouth 2 (two) times daily with a meal.    . fluticasone (FLONASE) 50 MCG/ACT nasal spray Place 1 spray into both nostrils daily as needed for allergies or rhinitis.    . InFLIXimab (REMICADE IV) Inject 5 mg into the vein. Patient is receiving 5 mg /kg IV every 8 weeks. Next dose is 12/09/2016.    . Multiple Minerals-Vitamins (CITRACAL PLUS PO) Take 1 tablet by mouth 2 (two) times daily.    . Multiple Vitamin (MULTIVITAMIN WITH MINERALS) TABS tablet Take 1 tablet by mouth daily.    Marland Kitchen saccharomyces boulardii (FLORASTOR) 250 MG capsule Take 250 mg by mouth 2 (two) times daily.     . vitamin C (ASCORBIC ACID) 500 MG tablet Take 500 mg by mouth daily.      No results found for this or any previous visit (from the past 48 hour(s)). No results found.  ROS  Blood pressure 112/70, pulse 61, temperature 98.6 F (37 C), temperature source Oral, resp. rate 15, height 5' 9"  (1.753 m), weight 164 lb (74.4 kg), SpO2 99 %. Physical Exam  Constitutional: He appears well-developed and  well-nourished.  HENT:  Mouth/Throat: Oropharynx is clear and moist.  Eyes: Conjunctivae are normal. No scleral icterus.  Neck: No thyromegaly present.  Cardiovascular: Normal rate, regular rhythm and normal heart sounds.  No murmur heard. Respiratory: Effort normal and breath sounds normal.  GI:  He has midline scar as well as scar either lower quadrant he has large incisional hernia in the lower mid abdomen.  Abdomen is soft and nontender without organomegaly or masses.  Musculoskeletal: He exhibits no edema.  He has a deformity to his right hand with  limited flexion to his fingers still has good grip.  Lymphadenopathy:    He has no cervical adenopathy.  Neurological: He is alert.  Skin: Skin is warm and dry.     Assessment/Plan Chronic ulcerative proctitis. Diagnostic flexible sigmoidoscopy to assess disease activity.  Hildred Laser, MD 01/19/2017, 11:46 AM

## 2017-01-23 ENCOUNTER — Other Ambulatory Visit (INDEPENDENT_AMBULATORY_CARE_PROVIDER_SITE_OTHER): Payer: Self-pay | Admitting: Internal Medicine

## 2017-01-23 ENCOUNTER — Other Ambulatory Visit (INDEPENDENT_AMBULATORY_CARE_PROVIDER_SITE_OTHER): Payer: Self-pay | Admitting: *Deleted

## 2017-01-23 ENCOUNTER — Encounter (HOSPITAL_COMMUNITY): Payer: Self-pay | Admitting: Internal Medicine

## 2017-01-23 DIAGNOSIS — K51819 Other ulcerative colitis with unspecified complications: Secondary | ICD-10-CM

## 2017-01-23 MED ORDER — HYDROCORTISONE 100 MG/60ML RE ENEM: 1.0000 | ENEMA | Freq: Every day | RECTAL | 0 refills | Status: DC

## 2017-01-31 LAB — INFLIXIMAB LEVEL AND ADA FOR IBD
Infliximab ADA, IBD: <10 AU (ref <10)
Infliximab Level, IBD: 6.3 ug/mL

## 2017-02-10 ENCOUNTER — Encounter (INDEPENDENT_AMBULATORY_CARE_PROVIDER_SITE_OTHER): Payer: Self-pay | Admitting: Internal Medicine

## 2017-02-10 ENCOUNTER — Telehealth (INDEPENDENT_AMBULATORY_CARE_PROVIDER_SITE_OTHER): Payer: Self-pay | Admitting: *Deleted

## 2017-02-10 NOTE — Telephone Encounter (Signed)
-----   Message from [place], Generic sent at 02/10/2017 9:28 AM EST -----    Hi Dr. Laural Golden,  I'm hoping you can help me with a question about my Canasa prescription. I had it filled this week and the copay was $100. When I called my prescription drug company ([facility]), I was told that Georgiann Mccoy had moved from a Tier 2 drug to a Tier 3 drug (brand name non-preferred), therefore the copay increased. I was also told that there is now a generic for Canasa. When I asked about the generic, the [facility] representative said the generic was mesalamine. She could not provide any additional information.  When asked earlier this week, the pharmacist at [facility] said no generic was available. We did a little research online and found some information about a generic version launched by [employer], noting it is "eligible for 180 days of generic drug exclusivity." (Not sure if this is accurate or what it means?)  My questions: Has there been an approved generic drug for Canasa and if so, is it available?  Thank you for your help!  Zenia Resides

## 2017-02-19 ENCOUNTER — Other Ambulatory Visit (INDEPENDENT_AMBULATORY_CARE_PROVIDER_SITE_OTHER): Payer: Self-pay | Admitting: Internal Medicine

## 2017-02-19 MED ORDER — MESALAMINE 1000 MG RE SUPP: 1000.0000 mg | Freq: Every day | RECTAL | 3 refills | Status: DC

## 2017-02-19 NOTE — Telephone Encounter (Signed)
Prescription for mesalamine suppository sent to [facility]  Tammy please look over to make sure prescription went to the right place.

## 2017-02-20 NOTE — Telephone Encounter (Signed)
Per Patient this was the correct Pharmacy.

## 2017-05-02 ENCOUNTER — Ambulatory Visit (INDEPENDENT_AMBULATORY_CARE_PROVIDER_SITE_OTHER): Payer: BLUE CROSS/BLUE SHIELD | Admitting: Internal Medicine

## 2017-05-09 ENCOUNTER — Other Ambulatory Visit (INDEPENDENT_AMBULATORY_CARE_PROVIDER_SITE_OTHER): Payer: Self-pay | Admitting: Internal Medicine

## 2017-05-09 DIAGNOSIS — K529 Noninfective gastroenteritis and colitis, unspecified: Secondary | ICD-10-CM

## 2017-05-10 ENCOUNTER — Encounter (INDEPENDENT_AMBULATORY_CARE_PROVIDER_SITE_OTHER): Payer: Self-pay | Admitting: Internal Medicine

## 2017-05-30 ENCOUNTER — Ambulatory Visit (INDEPENDENT_AMBULATORY_CARE_PROVIDER_SITE_OTHER): Payer: BLUE CROSS/BLUE SHIELD | Admitting: Internal Medicine

## 2017-05-30 ENCOUNTER — Encounter (INDEPENDENT_AMBULATORY_CARE_PROVIDER_SITE_OTHER): Payer: Self-pay | Admitting: Internal Medicine

## 2017-05-30 VITALS — BP 106/66 | HR 68 | Temp 97.8°F | Resp 18 | Ht 70.0 in | Wt 162.5 lb

## 2017-05-30 DIAGNOSIS — K529 Noninfective gastroenteritis and colitis, unspecified: Secondary | ICD-10-CM

## 2017-05-30 DIAGNOSIS — B370 Candidal stomatitis: Secondary | ICD-10-CM

## 2017-05-30 DIAGNOSIS — K51219 Ulcerative (chronic) proctitis with unspecified complications: Secondary | ICD-10-CM

## 2017-05-30 MED ORDER — NYSTATIN 100000 UNIT/ML MT SUSP: 5.0000 mL | Freq: Four times a day (QID) | OROMUCOSAL | 1 refills | Status: DC

## 2017-05-30 NOTE — Progress Notes (Signed)
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 difficile colitis which resulted in multisystem failure and prolonged hospitalization.  He was finally able to undergo takedown of ileostomy and was connected to rectum 4 years ago. He has recurrent disease and rectal pouch with ileorectal anastomotic stricture which has required dilation most recently in December 2018. He remains on infliximab and Canasa suppositories.  He is here for scheduled visit.  He is receiving infliximab infusion at [facility] rheumatology clinic in [place].  Last dose was 4 days ago. He has no complaints.  He has an average of 6-8 stools per day.  Stool consistency varies between loose to soft stools.  Caliber is small but has not changed.  He has noted less stool frequency when he eats fruits and meats and he has more trouble with vegetables and salads.  He denies nausea vomiting or abdominal pain.  He denies rectal bleeding.  He notices a small amount of blood on tissue sometimes.  He has lost 7 pounds in last 6 months.  He states he is more active.  He is not having any side effects with infliximab. He had blood work done at [facility] rheumatology at the time of his last infusion. His new complaint is that he has intermittent sore spots in his mouth.  Current Medications: Outpatient Encounter Medications as of 05/30/2017  Medication Sig  . acetaminophen (TYLENOL) 500 MG tablet Take 500 mg by mouth every 6 (six) hours as needed for moderate pain or headache.  Marland Kitchen aspirin EC 81 MG tablet Take 81 mg by mouth daily.  . diphenoxylate-atropine (LOMOTIL) 2.5-0.025 MG tablet TAKE 1 TABLET BY MOUTH 4 TIMES DAILY.  . ferrous sulfate 325 (65 FE) MG tablet Take 325 mg by mouth 2 (two) times daily with a meal.  . fluticasone (FLONASE)  50 MCG/ACT nasal spray Place 1 spray into both nostrils daily as needed for allergies or rhinitis.  . InFLIXimab (REMICADE IV) Inject 5 mg into the vein. Patient is receiving 5 mg /kg IV every 8 weeks. Next dose is 12/09/2016.  . mesalamine (CANASA) 1000 MG suppository Place 1 suppository (1,000 mg total) rectally at bedtime.  . Multiple Minerals-Vitamins (CITRACAL PLUS PO) Take 1 tablet by mouth 2 (two) times daily.  . Multiple Vitamin (MULTIVITAMIN WITH MINERALS) TABS tablet Take 1 tablet by mouth daily.  Marland Kitchen saccharomyces boulardii (FLORASTOR) 250 MG capsule Take 250 mg by mouth 2 (two) times daily.   . vitamin C (ASCORBIC ACID) 500 MG tablet Take 500 mg by mouth daily.  . [DISCONTINUED] hydrocortisone (CORTENEMA) 100 MG/60ML enema Place 1 enema (100 mg total) rectally at bedtime. (Patient not taking: Reported on 05/30/2017)   No facility-administered encounter medications on file as of 05/30/2017.      Objective: Blood pressure 106/66, pulse 68, temperature 97.8 F (36.6 C), temperature source Oral, resp. rate 18, height 5' 10"  (1.778 m), weight 162 lb 8 oz (73.7 kg). Patient is alert and in no acute distress. Conjunctiva is pink. Sclera is nonicteric Oropharyngeal exam reveals slightly thickened erythematous mucosa involving hard palate with posterior to upper incisors. No neck masses or thyromegaly noted. Cardiac exam with regular rhythm normal S1 and S2. No murmur or gallop noted. Lungs are clear to auscultation. Abdomen symmetrical with multiple scars.  He has small scar from prior PEG tube.  He has a long midline scar.  He also has horizontal scars on either side of the vertical scar.  He has incisional hernia at midline scar and is completely reducible.  Abdomen is soft and nontender. No LE edema or clubbing noted. 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  Labs/studies Results:  Lab data from 05/26/2017 WBC 6.8, H&H 13.1 and 40.0.  Platelet count 205K. Serum sodium 143,  potassium 4.5, chloride 106, CO2 20, BUN 22 and creatinine 1.28. Serum calcium 9.0. Bilirubin 0.4, AP 75, AST 15, ALT 23, total protein 6.5 and albumin 3.8.  QuantiFERON TB Gold negative.    Assessment:  #1.  Ulcerative proctitis.  He is still had active disease on flexible sigmoidoscopy of December 2018. He also required balloon dilation of ileorectal anastomotic stricture.  He is not having any symptoms.  Therefore I am inclined not to change his therapy at this time as there is always potential for worsening disease.  Therefore we will continue to monitor him closely.  #2.  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  He remains on diphenoxylate.  #3.  Oral symptoms may be due to candidiasis or side effect due to infliximab.   Plan:  Patient will continue infliximab infusion every 8 weeks and Canasa suppositories daily. Mycostatin suspension 500,000 units swish and swallow 4 times daily for 10 days. Patient will call when he finishes therapy. Office visit in 6 months.

## 2017-05-30 NOTE — Patient Instructions (Signed)
Please call office with progress report after you have finished using Mycostatin suspension. Will request copy of your blood work from [facility] rheumatology.

## 2017-07-19 ENCOUNTER — Telehealth (INDEPENDENT_AMBULATORY_CARE_PROVIDER_SITE_OTHER): Payer: Self-pay | Admitting: *Deleted

## 2017-07-19 NOTE — Telephone Encounter (Signed)
Lori from [facility] called and ask that the patient's last office not , possibly lab work if we did any, be sent to them so that the PA for his Remicade can be completed. Their fax number is [phone].

## 2017-07-19 NOTE — Telephone Encounter (Signed)
Labs & OV note faxed

## 2017-09-15 DIAGNOSIS — K51219 Ulcerative (chronic) proctitis with unspecified complications: Secondary | ICD-10-CM | POA: Diagnosis not present

## 2017-11-09 ENCOUNTER — Other Ambulatory Visit (INDEPENDENT_AMBULATORY_CARE_PROVIDER_SITE_OTHER): Payer: Self-pay | Admitting: Internal Medicine

## 2017-11-09 DIAGNOSIS — K529 Noninfective gastroenteritis and colitis, unspecified: Secondary | ICD-10-CM

## 2017-11-10 DIAGNOSIS — K51219 Ulcerative (chronic) proctitis with unspecified complications: Secondary | ICD-10-CM | POA: Diagnosis not present

## 2017-11-13 ENCOUNTER — Encounter (INDEPENDENT_AMBULATORY_CARE_PROVIDER_SITE_OTHER): Payer: Self-pay

## 2017-11-21 ENCOUNTER — Encounter (INDEPENDENT_AMBULATORY_CARE_PROVIDER_SITE_OTHER): Payer: Self-pay

## 2017-12-05 ENCOUNTER — Ambulatory Visit (INDEPENDENT_AMBULATORY_CARE_PROVIDER_SITE_OTHER): Payer: BLUE CROSS/BLUE SHIELD | Admitting: Internal Medicine

## 2017-12-07 ENCOUNTER — Encounter (INDEPENDENT_AMBULATORY_CARE_PROVIDER_SITE_OTHER): Payer: Self-pay | Admitting: Internal Medicine

## 2017-12-07 ENCOUNTER — Ambulatory Visit (INDEPENDENT_AMBULATORY_CARE_PROVIDER_SITE_OTHER): Payer: BLUE CROSS/BLUE SHIELD | Admitting: Internal Medicine

## 2017-12-07 VITALS — BP 90/60 | HR 66 | Temp 98.8°F | Resp 18 | Ht 70.0 in | Wt 166.7 lb

## 2017-12-07 DIAGNOSIS — K529 Noninfective gastroenteritis and colitis, unspecified: Secondary | ICD-10-CM

## 2017-12-07 DIAGNOSIS — K51219 Ulcerative (chronic) proctitis with unspecified complications: Secondary | ICD-10-CM | POA: Diagnosis not present

## 2017-12-07 MED ORDER — SACCHAROMYCES BOULARDII 250 MG PO CAPS: 250.0000 mg | ORAL_CAPSULE | Freq: Every day | ORAL | 3 refills | Status: DC

## 2017-12-07 NOTE — Patient Instructions (Signed)
Flexible sigmoidoscopy to be scheduled in December 2019. Remember to ask for an inactivated vaccine and not attenuated vaccine because you are on biologic therapy.

## 2017-12-07 NOTE — Progress Notes (Signed)
Presenting complaint;  Follow-up for ulcerative colitis/proctitis.  Database and subjective:  Patient is 57 year old Caucasian male who has a history of ulcerative colitis who has had subtotal colectomy.  He has active disease and rectum and he also has a ileorectal anastomosis which was dilated in the past.  His last sigmoidoscopy was in December 2018 when he was noted to have active disease.  He is on infliximab infusion every 8 weeks at a dose of 5 mg/kg and he is also on probiotic and Canasa suppositories.  He was last seen in May 2019.  He presents for scheduled visit accompanied by his wife Estill Bamberg. His wife states he is doing well.  She states he has more energy than she has.  He generally has 5-7 bowel movements per day.  Stool consistency varies from liquid to formed stool.  He has noted more diarrhea when he eats vegetables.  He seems to have less diarrhea with meats.  He may notice scant amount of blood on wiping once or twice a month.  He denies abdominal pain.  He is having an average of 3 nocturnal bowel movements.  Previously he is to have a couple every night.  He has very good appetite.  He has gained 4 pounds since his last visit.  He is not having any side effects with infliximab that he is receiving at rheumatology clinic in [place].  He states his co-pay is 0.   Current Medications: Outpatient Encounter Medications as of 12/07/2017  Medication Sig  . acetaminophen (TYLENOL) 500 MG tablet Take 500 mg by mouth every 6 (six) hours as needed for moderate pain or headache.  Marland Kitchen aspirin EC 81 MG tablet Take 81 mg by mouth daily.  . diphenoxylate-atropine (LOMOTIL) 2.5-0.025 MG tablet TAKE 1 TABLET BY MOUTH 4 TIMES DAILY.  . ferrous sulfate 325 (65 FE) MG tablet Take 325 mg by mouth 2 (two) times daily with a meal.  . fluticasone (FLONASE) 50 MCG/ACT nasal spray Place 1 spray into both nostrils daily as needed for allergies or rhinitis.  . InFLIXimab (REMICADE IV) Inject 5 mg into  the vein. Patient is receiving 5 mg /kg IV every 8 weeks. Next dose is 12/09/2016.  . mesalamine (CANASA) 1000 MG suppository Place 1 suppository (1,000 mg total) rectally at bedtime.  . Multiple Minerals-Vitamins (CITRACAL PLUS PO) Take 1 tablet by mouth 2 (two) times daily.  . Multiple Vitamin (MULTIVITAMIN WITH MINERALS) TABS tablet Take 1 tablet by mouth daily.  Marland Kitchen saccharomyces boulardii (FLORASTOR) 250 MG capsule Take 250 mg by mouth 2 (two) times daily.   . vitamin C (ASCORBIC ACID) 500 MG tablet Take 500 mg by mouth daily.  . [DISCONTINUED] nystatin (MYCOSTATIN) 100000 UNIT/ML suspension Take 5 mLs (500,000 Units total) by mouth 4 (four) times daily. (Patient not taking: Reported on 12/07/2017)   No facility-administered encounter medications on file as of 12/07/2017.      Objective: Blood pressure 90/60, pulse 66, temperature 98.8 F (37.1 C), temperature source Oral, resp. rate 18, height 5' 10"  (1.778 m), weight 166 lb 11.2 oz (75.6 kg). Patient is alert and in no acute distress. Conjunctiva is pink. Sclera is nonicteric Oropharyngeal mucosa is normal. No neck masses or thyromegaly noted. Cardiac exam with regular rhythm normal S1 and S2. No murmur or gallop noted. Lungs are clear to auscultation. Abdomen he has midline scar with incisional hernia which is completely reducible.  He also has scars in right and left lower quadrant due to ileostomy and colostomy.  Abdomen is soft and nontender with organomegaly or masses. No LE edema or clubbing noted. He remains with contracture of right hand with limited grip.  Labs/studies Results:  Lab data from 05/26/2017 WBC 6.8, H&H 13.1 and 40.0.platelet count 204K.   Bilirubin 0.4, AP 75, AST 15, ALT 23 and albumin 3.8. Serum creatinine 1.28 and BUN 22.  Assessment:  #1.  Chronic ulcerative proctitis.  He had active disease on flexible sigmoidoscopy of December 2019.  He is on infliximab infusion for maintenance.  He had trough and  antibody testing in January this year and trough level was in desirable range and he did not have any antibodies.  His disease been complicated by ileorectal anastomotic stricture which was previously dilated.  He does not have any symptoms to suggest restenosis.  Given active disease he needs to be reevaluated to make sure he is in remission.  #2.  Chronic diarrhea secondary loss of most of colon.  His stool frequency has gradually improved to where he is now.  He is not having any accidents. He remains on Lomotil for frequency control.  #3.  Incisional hernia.  He has been evaluated by Dr. Excell Seltzer and decision was made to watch him.  His hernia has not increased in size.  He seemed to be getting support from his clothing and belt.   Plan:  Continue infliximab Canasa and probiotic. New prescription given for Florastor 1 capsule p.o. Daily. Flexible sigmoidoscopy with conscious sedation to determine disease activity. Office visit in 6 months.

## 2017-12-08 ENCOUNTER — Other Ambulatory Visit (INDEPENDENT_AMBULATORY_CARE_PROVIDER_SITE_OTHER): Payer: Self-pay | Admitting: Internal Medicine

## 2017-12-08 ENCOUNTER — Encounter (INDEPENDENT_AMBULATORY_CARE_PROVIDER_SITE_OTHER): Payer: Self-pay

## 2017-12-08 DIAGNOSIS — K51819 Other ulcerative colitis with unspecified complications: Secondary | ICD-10-CM

## 2017-12-08 DIAGNOSIS — K51219 Ulcerative (chronic) proctitis with unspecified complications: Secondary | ICD-10-CM | POA: Insufficient documentation

## 2017-12-12 DIAGNOSIS — I480 Paroxysmal atrial fibrillation: Secondary | ICD-10-CM | POA: Diagnosis not present

## 2017-12-12 DIAGNOSIS — Z23 Encounter for immunization: Secondary | ICD-10-CM | POA: Diagnosis not present

## 2017-12-12 DIAGNOSIS — Z9049 Acquired absence of other specified parts of digestive tract: Secondary | ICD-10-CM | POA: Diagnosis not present

## 2017-12-12 DIAGNOSIS — Z13220 Encounter for screening for lipoid disorders: Secondary | ICD-10-CM | POA: Diagnosis not present

## 2017-12-12 DIAGNOSIS — N182 Chronic kidney disease, stage 2 (mild): Secondary | ICD-10-CM | POA: Diagnosis not present

## 2017-12-12 DIAGNOSIS — Z0000 Encounter for general adult medical examination without abnormal findings: Secondary | ICD-10-CM | POA: Diagnosis not present

## 2018-01-05 DIAGNOSIS — K51219 Ulcerative (chronic) proctitis with unspecified complications: Secondary | ICD-10-CM | POA: Diagnosis not present

## 2018-01-15 ENCOUNTER — Other Ambulatory Visit: Payer: Self-pay

## 2018-01-15 ENCOUNTER — Encounter (HOSPITAL_COMMUNITY)
Admission: RE | Disposition: A | Discharge status: Home / Self Care | Payer: Self-pay | Source: Home / Self Care | Attending: Internal Medicine

## 2018-01-15 ENCOUNTER — Ambulatory Visit (HOSPITAL_COMMUNITY)
Admission: RE | Admit: 2018-01-15 | Discharge: 2018-01-15 | Disposition: A | Discharge status: Home / Self Care | Payer: BLUE CROSS/BLUE SHIELD | Attending: Internal Medicine | Admitting: Internal Medicine

## 2018-01-15 DIAGNOSIS — Z9049 Acquired absence of other specified parts of digestive tract: Secondary | ICD-10-CM | POA: Insufficient documentation

## 2018-01-15 DIAGNOSIS — K6289 Other specified diseases of anus and rectum: Secondary | ICD-10-CM | POA: Insufficient documentation

## 2018-01-15 DIAGNOSIS — Z79899 Other long term (current) drug therapy: Secondary | ICD-10-CM | POA: Insufficient documentation

## 2018-01-15 DIAGNOSIS — Z8673 Personal history of transient ischemic attack (TIA), and cerebral infarction without residual deficits: Secondary | ICD-10-CM | POA: Insufficient documentation

## 2018-01-15 DIAGNOSIS — K51212 Ulcerative (chronic) proctitis with intestinal obstruction: Secondary | ICD-10-CM | POA: Diagnosis not present

## 2018-01-15 DIAGNOSIS — Z09 Encounter for follow-up examination after completed treatment for conditions other than malignant neoplasm: Secondary | ICD-10-CM | POA: Diagnosis not present

## 2018-01-15 DIAGNOSIS — Z7982 Long term (current) use of aspirin: Secondary | ICD-10-CM | POA: Diagnosis not present

## 2018-01-15 DIAGNOSIS — K5190 Ulcerative colitis, unspecified, without complications: Secondary | ICD-10-CM | POA: Insufficient documentation

## 2018-01-15 DIAGNOSIS — Z86718 Personal history of other venous thrombosis and embolism: Secondary | ICD-10-CM | POA: Insufficient documentation

## 2018-01-15 DIAGNOSIS — K51219 Ulcerative (chronic) proctitis with unspecified complications: Secondary | ICD-10-CM | POA: Insufficient documentation

## 2018-01-15 DIAGNOSIS — K626 Ulcer of anus and rectum: Secondary | ICD-10-CM | POA: Diagnosis not present

## 2018-01-15 DIAGNOSIS — Z87891 Personal history of nicotine dependence: Secondary | ICD-10-CM | POA: Diagnosis not present

## 2018-01-15 DIAGNOSIS — K51819 Other ulcerative colitis with unspecified complications: Secondary | ICD-10-CM

## 2018-01-15 DIAGNOSIS — K5120 Ulcerative (chronic) proctitis without complications: Secondary | ICD-10-CM | POA: Diagnosis present

## 2018-01-15 HISTORY — PX: FLEXIBLE SIGMOIDOSCOPY: SHX5431

## 2018-01-15 HISTORY — PX: BALLOON DILATION: SHX5330

## 2018-01-15 SURGERY — SIGMOIDOSCOPY, FLEXIBLE
Anesthesia: Moderate Sedation

## 2018-01-15 MED ORDER — MIDAZOLAM HCL 5 MG/5ML IJ SOLN
INTRAMUSCULAR | Status: AC
  Filled 2018-01-15: qty 10

## 2018-01-15 MED ORDER — STERILE WATER FOR IRRIGATION IR SOLN
Status: DC | PRN
  Administered 2018-01-15: 10:00:00

## 2018-01-15 MED ORDER — LIDOCAINE HCL URETHRAL/MUCOSAL 2 % EX GEL
CUTANEOUS | Status: AC
  Filled 2018-01-15: qty 30

## 2018-01-15 MED ORDER — LIDOCAINE HCL URETHRAL/MUCOSAL 2 % EX GEL
CUTANEOUS | Status: DC | PRN
  Administered 2018-01-15: 1 via TOPICAL

## 2018-01-15 MED ORDER — MEPERIDINE HCL 25 MG/ML IJ SOLN
INTRAMUSCULAR | Status: DC | PRN
  Administered 2018-01-15: 10 mg via INTRAVENOUS
  Administered 2018-01-15 (×2): 20 mg via INTRAVENOUS

## 2018-01-15 MED ORDER — SODIUM CHLORIDE 0.9 % IV SOLN
INTRAVENOUS | Status: DC
  Administered 2018-01-15: 09:00:00 via INTRAVENOUS

## 2018-01-15 MED ORDER — MEPERIDINE HCL 50 MG/ML IJ SOLN
INTRAMUSCULAR | Status: AC
  Filled 2018-01-15: qty 1

## 2018-01-15 MED ORDER — MIDAZOLAM HCL 5 MG/5ML IJ SOLN
INTRAMUSCULAR | Status: DC | PRN
  Administered 2018-01-15 (×3): 2 mg via INTRAVENOUS
  Administered 2018-01-15: 1 mg via INTRAVENOUS

## 2018-01-15 NOTE — Discharge Instructions (Signed)
Resume aspirin on 01/18/2018. Resume other medications as before. Resume usual diet. No driving for 24 hours. Please call office if you have abdominal pain or change in bowel habits.   Flexible Sigmoidoscopy, Care After This sheet gives you information about how to care for yourself after your procedure. Your health care provider may also give you more specific instructions. If you have problems or questions, contact your health care provider. What can I expect after the procedure? After the procedure, it is common to have:  Abdominal cramping or pain.  Bloating.  A small amount of rectal bleeding if you had a biopsy. Follow these instructions at home:  Take over-the-counter and prescription medicines only as told by your health care provider.  Do not drive for 24 hours if you received a medicine to help you relax (sedative).  Keep all follow-up visits as told by your health care provider. This is important. Contact a health care provider if:  You have abdominal pain or cramping that gets worse or is not helped with medicine.  You continue to have small amounts of rectal bleeding after 24 hours.  You have nausea or vomiting.  You feel weak or dizzy.  You have a fever. Get help right away if:  You pass large blood clots or see a large amount of blood in the toilet after having a bowel movement.  You have nausea or vomiting for more than 24 hours after the procedure. This information is not intended to replace advice given to you by your health care provider. Make sure you discuss any questions you have with your health care provider. Document Released: 01/15/2013 Document Revised: 07/31/2015 Document Reviewed: 04/11/2015 Elsevier Interactive Patient Education  2019 [employer].

## 2018-01-15 NOTE — Op Note (Signed)
[facility] Patient Name: Connor Small Procedure Date: 01/15/2018 9:20 AM MRN: [id number] Date of Birth: Sep 15, 1960 Attending MD: Lionel December , MD CSN: [id number] Age: 57 Admit Type: Outpatient Procedure:                Flexible Sigmoidoscopy Indications:              Chronic ulcerative proctitis, Disease activity                            assessment of chronic ulcerative proctitis, Assess                            therapeutic response to therapy of chronic                            ulcerative proctitis Providers:                Lionel December, MD, Loma Messing B. Patsy Lager, RN, Dyann Ruddle Referring MD:             Eartha Inch, MD Medicines:                Meperidine 50 mg IV, Midazolam 7 mg IV, Lidocaine                            jelly to anal canal Complications:            No immediate complications. Estimated Blood Loss:     Estimated blood loss was minimal. Procedure:                Pre-Anesthesia Assessment:                           - Prior to the procedure, a History and Physical                            was performed, and patient medications and                            allergies were reviewed. The patient's tolerance of                            previous anesthesia was also reviewed. The risks                            and benefits of the procedure and the sedation                            options and risks were discussed with the patient.                            All questions were answered, and informed consent  was obtained. Prior Anticoagulants: The patient                            last took aspirin 4 days prior to the procedure.                            ASA Grade Assessment: II - A patient with mild                            systemic disease. After reviewing the risks and                            benefits, the patient was deemed in satisfactory                            condition to  undergo the procedure.                           After obtaining informed consent, the scope was                            passed under direct vision. The [id number]-PH190L                            ([id number]) scope was introduced through the and                            advanced to the the ileo-rectal anastomosis. The                            flexible sigmoidoscopy was technically difficult                            and complex due to bowel stenosis. The quality of                            the bowel preparation was good. The patient                            tolerated the procedure fairly well. Scope In: 9:49:44 AM Scope Out: 10:10:10 AM Total Procedure Duration: 0 hours 20 minutes 26 seconds  Findings:      The perianal and digital rectal examinations were normal.      The proximal rectum appeared normal.      A localized area of mildly ulcerated mucosa was found in the distal       rectum.      A benign-appearing, intrinsic severe ileo-rectal stenosis measuring less       than one cm (in length) x 6 mm (inner diameter) was found at the       anastomosis and was traversed after dilation. A TTS dilator was passed       through the scope. Dilation with a 10 mm, an 11 mm and a 12 mm colonic       balloon dilator was performed. The dilation site was examined  and showed       mild mucosal disruption, mild improvement in luminal narrowing and no       perforation.      The neo-terminal ileum appeared normal.      Anal papilla(e) with surface ulceration was hypertrophied. Impression:               - The proximal rectum is normal.                           - Ulcerated mucosa in the distal rectum.                           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 Dilated.                           - Neo-terminal ileum is normal.                           - Anal papilla(e) were hypertrophied.                           - No specimens collected. Moderate Sedation:      Moderate (conscious)  sedation was administered by the endoscopy nurse       and supervised by the endoscopist. The following parameters were       monitored: oxygen saturation, heart rate, blood pressure, CO2       capnography and response to care. Total physician intraservice time was       25 minutes. Recommendation:           - Discharge patient to home (with spouse).                           - Resume previous diet today.                           - Continue present medications.                           - No aspirin, ibuprofen, naproxen, or other                            non-steroidal anti-inflammatory drugs for 3 days.                           - Repeat flexible sigmoidoscopy in 1 year or                            earlier if has obstructive symptoms. Procedure Code(s):        --- Professional ---                           [phone], Sigmoidoscopy, flexible; with                            transendoscopic balloon dilation  40981, Moderate sedation; each additional 15                            minutes intraservice time                           G0500, Moderate sedation services provided by the                            same physician or other qualified health care                            professional performing a gastrointestinal                            endoscopic service that sedation supports,                            requiring the presence of an independent trained                            observer to assist in the monitoring of the                            patient's level of consciousness and physiological                            status; initial 15 minutes of intra-service time;                            patient age 59 years or older (additional time may                            be reported with 19147, as appropriate) Diagnosis Code(s):        --- Professional ---                           W29.562, Ulcerative (chronic) proctitis with                             intestinal obstruction                           K62.6, Ulcer of anus and rectum                           K62.89, Other specified diseases of anus and rectum CPT copyright 2018 [employer]. All rights reserved. The codes documented in this report are preliminary and upon coder review may  be revised to meet current compliance requirements. Lionel December, MD Lionel December, MD 01/15/2018 10:27:03 AM This report has been signed electronically. Number of Addenda: 0

## 2018-01-15 NOTE — H&P (Signed)
Connor Small is an 57 y.o. male.   Chief Complaint: Patient is here for flexible sigmoidoscopy HPI: Patient is 57 year old Caucasian male who has history of chronic ulcerative colitis.  He is status post subtotal colectomy for fulminant C. 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  His last exam was 1 year ago he still had active disease.  He is on infliximab and mesalamine suppositories.  He has 6-7 bowel movements per day.  He is not having frank rectal bleeding but he does see blood on the tissue when he wipes.  He denies abdominal pain.  Past Medical History:  Diagnosis Date  . Allergic rhinitis 06/28/2012  . Ascites s/p US guide paracentesis (4/2) 04/25/2012  . Aspiration pneumonitis ([place]) 04/26/2012  . Chronic diarrhea   . Clostridium difficile colitis 04/26/2012  . 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 HAP (hospital-acquired pneumonia) 04/21/2012  . Hemorrhoids   . History of acute renal failure    post-op  total colectomy 02-27-2012--  required dialysis--  resolved  . History of ARDS    02-27-2012  post op total colectomy -- septic shock-  required intubation/ventilator for 3-4 wks  . History of atrial fibrillation    02/ 2014--  no issues since  . History of Clostridium difficile colitis    02-27-2012--  fulminant C.Diff colitis caused toxic megacolon--  s/p  total colectomy  . History of DVT (deep vein thrombosis)    2014--  right upper extremity  . History of pleural effusion    acute- bilateral in setting septic shock post op Feb 2014  . History of septic shock dx's at this time--  ARDS, ARF, CVA, A-Fib   Feb 2014--  post op total colectomy from toxic megacolon/ fulminant C.Diff Colitis  . History of stroke March 2014 --  residual right weaker than prior to cva, unable to make fist   Bilateral cerebral and right cerebellar infarcts - felt to be embolic vs hypotensive (in setting of septic shock,  transient a-fib, ARDS)  . PAF (paroxysmal atrial fibrillation) ([place]) 03/15/2012  . Rectal bleed   . Renal calculi    bilateral--  left > right  . Ulcerative colitis followed by dr Laural Golden   Distal UC dx 2004  . Urgency of urination     Past Surgical History:  Procedure Laterality Date  . APPLICATION OF WOUND VAC N/A 03/26/2012   Procedure: APPLICATION OF WOUND VAC;  Surgeon: Edward Jolly, MD;  Location: [place];  Service: General;  Laterality: N/A;  . BIOPSY  01/19/2017   Procedure: BIOPSY;  Surgeon: Rogene Houston, MD;  [facility];  Service: Endoscopy;;  rectal  . CECOSTOMY  02/27/2012   Procedure: CECOSTOMY;  Surgeon: Donato Heinz, MD;  [facility];  Service: General;  Laterality: N/A;  Cecostomy Tube Placement  . COLECTOMY  02/28/2012   Procedure: TOTAL COLECTOMY;  Surgeon: Donato Heinz, MD;  [facility];  Service: General;  Laterality: N/A;  . COLONOSCOPY  02/16/2011   Procedure: COLONOSCOPY;  Surgeon: Rogene Houston, MD;  [facility];  Service: Endoscopy;  Laterality: N/A;  100  . CYSTOSCOPY W/ URETERAL STENT PLACEMENT Bilateral 03/20/2012   Procedure: CYSTOSCOPY WITH RETROGRADE PYELOGRAM/;  Surgeon: Alexis Frock, MD;  Location: [place];  Service: Urology;  Laterality: Bilateral. Pt reports no stents were placed.  . CYSTOSCOPY WITH RETROGRADE PYELOGRAM, URETEROSCOPY AND STENT PLACEMENT Left 07/16/2014  Procedure: CYSTOSCOPY WITH RETROGRADE PYELOGRAM,  AND LEFT STENT PLACEMENT;  Surgeon: Alexis Frock, MD;  Location: [facility];  Service: Urology;  Laterality: Left;  . CYSTOSCOPY WITH RETROGRADE PYELOGRAM, URETEROSCOPY AND STENT PLACEMENT Bilateral 07/25/2014   Procedure: CYSTOSCOPY WITH RETROGRADE PYELOGRAM, URETEROSCOPY AND BILATERAL STENT PLACEMENT;  Surgeon: Alexis Frock, MD;  Location: [facility];  Service: Urology;  Laterality: Bilateral;  . ESOPHAGOGASTRODUODENOSCOPY N/A 01/14/2016   Procedure: ESOPHAGOGASTRODUODENOSCOPY  (EGD);  Surgeon: Rogene Houston, MD;  [facility];  Service: Endoscopy;  Laterality: N/A;  . FLEXIBLE SIGMOIDOSCOPY  02/27/2012   Procedure: FLEXIBLE SIGMOIDOSCOPY;  Surgeon: Rogene Houston, MD;  [facility];  Service: Endoscopy;  Laterality: N/A;  with colonic decompression  . FLEXIBLE SIGMOIDOSCOPY N/A 02/06/2013   Procedure: FLEXIBLE SIGMOIDOSCOPY;  Surgeon: Rogene Houston, MD;  [facility];  Service: Endoscopy;  Laterality: N/A;  100-moved to 1200 Ann notified pt  . FLEXIBLE SIGMOIDOSCOPY N/A 03/27/2013   Procedure: FLEXIBLE SIGMOIDOSCOPY;  Surgeon: Rogene Houston, MD;  [facility];  Service: Endoscopy;  Laterality: N/A;  225  . FLEXIBLE SIGMOIDOSCOPY N/A 12/11/2013   Procedure: FLEXIBLE SIGMOIDOSCOPY;  Surgeon: Rogene Houston, MD;  [facility];  Service: Endoscopy;  Laterality: N/A;  200  . FLEXIBLE SIGMOIDOSCOPY N/A 01/07/2015   Procedure: FLEXIBLE SIGMOIDOSCOPY;  Surgeon: Rogene Houston, MD;  [facility];  Service: Endoscopy;  Laterality: N/A;  830  . FLEXIBLE SIGMOIDOSCOPY N/A 01/14/2016   Procedure: FLEXIBLE SIGMOIDOSCOPY;  Surgeon: Rogene Houston, MD;  [facility];  Service: Endoscopy;  Laterality: N/A;  . FLEXIBLE SIGMOIDOSCOPY N/A 01/19/2017   Procedure: FLEXIBLE SIGMOIDOSCOPY;  Surgeon: Rogene Houston, MD;  [facility];  Service: Endoscopy;  Laterality: N/A;  1200  . HOLMIUM LASER APPLICATION Left 07/27/1636   Procedure: HOLMIUM LASER APPLICATION, LEFT ONLY ;  Surgeon: Alexis Frock, MD;  Location[facility];  Service: Urology;  Laterality: Left;  . ILEOSTOMY  02/28/2012   Procedure: ILEOSTOMY;  Surgeon: Donato Heinz, MD;  [facility];  Service: General;  Laterality: N/A;  . ILEOSTOMY CLOSURE N/A 05/27/2013   Procedure: ILEOSTOMY TAKEDOWN WITH ILEOPROCTOSTOMY;  Surgeon: Edward Jolly, MD;  [facility];  Service: General;  Laterality: N/A;  . LAPAROTOMY   02/27/2012   Procedure: EXPLORATORY LAPAROTOMY;  Surgeon: Donato Heinz, MD;  [facility];  Service: General;  Laterality: N/A;  . LAPAROTOMY N/A 03/26/2012   Procedure: EXPLORATORY LAPAROTOMY;  Surgeon: Edward Jolly, MD;  Location: [place];  Service: General;  Laterality: N/A;  . LAPAROTOMY N/A 03/29/2012   Procedure: EXPLORATORY LAPAROTOMY, PARTIAL WOUND CLOSURE;  Surgeon: Edward Jolly, MD;  Location: [place];  Service: General;  Laterality: N/A;  . LIVER BIOPSY N/A 03/26/2012   Procedure: LIVER BIOPSY;  Surgeon: Edward Jolly, MD;  Location: [place];  Service: General;  Laterality: N/A;  . SIGMOID COLECTOMY /  LOW ANTERIOR RESECTION/  END SIGMOID COLOSTOMY  11-07-2010   [facility]   Sigmoid colonic perferation---  TAKE DOWN COLOSTOMY,  Jan 2013  . TRANSTHORACIC ECHOCARDIOGRAM  03-26-2012  (in setting of septic shock)   report states  poorly visualized--  normal LVSF, ef 50-55% and no pericardial effusion  . VACUUM ASSISTED CLOSURE CHANGE N/A 03/29/2012   Procedure: Open ABDOMINAL VACUUM  CHANGE;  Surgeon: Edward Jolly, MD;  Location: [place];  Service: General;  Laterality: N/A;  . VACUUM ASSISTED  CLOSURE CHANGE N/A 04/01/2012   Procedure: removal of abdominal vac dressing and abdominal closure;  Surgeon: Edward Jolly, MD;  [facility];  Service: General;  Laterality: N/A;    Family History  Problem Relation Age of Onset  . Cancer Sister   . Healthy Daughter   . Hypertension Mother   . Colon cancer Neg Hx    Social History:  reports that he quit smoking about 33 years ago. His smoking use included cigarettes. He has a 6.00 pack-year smoking history. He has never used smokeless tobacco. He reports that he does not drink alcohol or use drugs.  Allergies:  Allergies  Allergen Reactions  . Penicillins Other (See Comments)    "Heart rate slowed down"  bradycardia DID THE REACTION INVOLVE: Swelling of the face/tongue/throat, SOB, or low BP? Yes Sudden or severe  rash/hives, skin peeling, or the inside of the mouth or nose? No Did it require medical treatment? Yes When did it last happen?years  If all above answers are "NO", may proceed with cephalosporin use.    . Clindamycin/Lincomycin Diarrhea    C-diff  . Morphine And Related Nausea And Vomiting  . Claritin [Loratadine] Rash  . Contrast Media [Iodinated Diagnostic Agents] Hives    Patient reports hives after IV contrast.   . Primaxin [Imipenem] Rash    Medications Prior to Admission  Medication Sig Dispense Refill  . acetaminophen (TYLENOL) 500 MG tablet Take 500 mg by mouth daily as needed for moderate pain or headache.     Marland Kitchen aspirin EC 81 MG tablet Take 81 mg by mouth daily.    . diphenhydrAMINE (BENADRYL) 25 MG tablet Take 25 mg by mouth See admin instructions. Take before remicade infusion    . diphenoxylate-atropine (LOMOTIL) 2.5-0.025 MG tablet TAKE 1 TABLET BY MOUTH 4 TIMES DAILY. (Patient taking differently: Take 1 tablet by mouth 4 (four) times daily. ) 120 tablet 0  . ferrous sulfate 325 (65 FE) MG tablet Take 325 mg by mouth 2 (two) times daily with a meal.    . InFLIXimab (REMICADE IV) Inject 5 mg into the vein. Patient is receiving 5 mg /kg IV every 8 weeks. Next dose is 12/09/2016.    . mesalamine (CANASA) 1000 MG suppository Place 1 suppository (1,000 mg total) rectally at bedtime. 90 suppository 3  . Multiple Minerals-Vitamins (CITRACAL PLUS PO) Take 1 tablet by mouth 2 (two) times daily.    . Multiple Vitamin (MULTIVITAMIN WITH MINERALS) TABS tablet Take 1 tablet by mouth daily.    Marland Kitchen saccharomyces boulardii (FLORASTOR) 250 MG capsule Take 1 capsule (250 mg total) by mouth daily. (Patient taking differently: Take 250 mg by mouth at bedtime. ) 90 capsule 3  . vitamin C (ASCORBIC ACID) 500 MG tablet Take 500 mg by mouth daily.      No results found for this or any previous visit (from the past 48 hour(s)). No results found.  ROS  Blood pressure 111/69, pulse (!) 49,  temperature 97.6 F (36.4 C), temperature source Oral, resp. rate 15, height 5' 9"  (1.753 m), weight 75.3 kg, SpO2 100 %. Physical Exam  Constitutional: He appears well-developed and well-nourished.  HENT:  Mouth/Throat: Oropharynx is clear and moist.  Eyes: Conjunctivae are normal.  Neck: No thyromegaly present.  Cardiovascular: Normal rate, regular rhythm and normal heart sounds.  No murmur heard. Respiratory: Effort normal and breath sounds normal.  GI:  He has long midline scar and horizontal scar in left lower quadrant from prior colostomy.  He has large incisional hernia.  Abdomen is soft and nontender with organomegaly or masses  Musculoskeletal:        General: No edema.     Comments: He has contracture to his right hand with weak grip.  Lymphadenopathy:    He has no cervical adenopathy.  Neurological: He is alert.  Skin: Skin is warm and dry.     Assessment/Plan Chronic ulcerative proctitis. Diagnostic flexible sigmoidoscopy to determine the disease activity and dilate ileorectal stricture if indicated.  Hildred Laser, MD 01/15/2018, 9:36 AM

## 2018-01-22 ENCOUNTER — Encounter (HOSPITAL_COMMUNITY): Payer: Self-pay | Admitting: Internal Medicine

## 2018-02-02 ENCOUNTER — Other Ambulatory Visit (INDEPENDENT_AMBULATORY_CARE_PROVIDER_SITE_OTHER): Payer: Self-pay | Admitting: Internal Medicine

## 2018-03-02 DIAGNOSIS — K51219 Ulcerative (chronic) proctitis with unspecified complications: Secondary | ICD-10-CM | POA: Diagnosis not present

## 2018-03-02 DIAGNOSIS — Z79899 Other long term (current) drug therapy: Secondary | ICD-10-CM | POA: Diagnosis not present

## 2018-03-06 ENCOUNTER — Encounter (INDEPENDENT_AMBULATORY_CARE_PROVIDER_SITE_OTHER): Payer: Self-pay

## 2018-04-09 ENCOUNTER — Encounter (INDEPENDENT_AMBULATORY_CARE_PROVIDER_SITE_OTHER): Payer: Self-pay

## 2018-04-20 ENCOUNTER — Encounter (INDEPENDENT_AMBULATORY_CARE_PROVIDER_SITE_OTHER): Payer: Self-pay

## 2018-04-27 DIAGNOSIS — K51219 Ulcerative (chronic) proctitis with unspecified complications: Secondary | ICD-10-CM | POA: Diagnosis not present

## 2018-06-07 ENCOUNTER — Encounter (INDEPENDENT_AMBULATORY_CARE_PROVIDER_SITE_OTHER): Payer: Self-pay

## 2018-06-12 ENCOUNTER — Ambulatory Visit (INDEPENDENT_AMBULATORY_CARE_PROVIDER_SITE_OTHER): Payer: BLUE CROSS/BLUE SHIELD | Admitting: Internal Medicine

## 2018-06-13 ENCOUNTER — Other Ambulatory Visit: Payer: Self-pay

## 2018-06-13 ENCOUNTER — Ambulatory Visit (INDEPENDENT_AMBULATORY_CARE_PROVIDER_SITE_OTHER): Payer: BLUE CROSS/BLUE SHIELD | Admitting: Internal Medicine

## 2018-06-13 ENCOUNTER — Encounter (INDEPENDENT_AMBULATORY_CARE_PROVIDER_SITE_OTHER): Payer: Self-pay | Admitting: Internal Medicine

## 2018-06-13 VITALS — BP 108/67 | HR 64 | Temp 98.2°F | Resp 18 | Ht 70.0 in | Wt 168.0 lb

## 2018-06-13 DIAGNOSIS — K529 Noninfective gastroenteritis and colitis, unspecified: Secondary | ICD-10-CM

## 2018-06-13 DIAGNOSIS — K51219 Ulcerative (chronic) proctitis with unspecified complications: Secondary | ICD-10-CM | POA: Diagnosis not present

## 2018-06-13 MED ORDER — DIPHENOXYLATE-ATROPINE 2.5-0.025 MG PO TABS: 1.0000 | ORAL_TABLET | Freq: Four times a day (QID) | ORAL | 5 refills | Status: DC

## 2018-06-13 NOTE — Patient Instructions (Signed)
Physician will call with results of blood test when completed.

## 2018-06-13 NOTE — Progress Notes (Signed)
Presenting complaint;  Follow-up for ulcerative proctitis.  Database and subjective:  Patient is 58 year old Caucasian male with history of chronic ulcerative colitis who underwent subtotal colectomy few years ago for fulminant C. difficile colitis.  He has residual disease/ulcerative proctitis and has developed ileal rectal stricture which was last dilated in December 2019.  He remains on infliximab infusion every 8 weeks and Canasa suppositories daily. He was last seen on 12/07/2017.  He is here for scheduled visit.  He states he is doing well.  On most days he has 6-7 stools per day.  Since his last visit he had 2 days when he only had 4 bowel movements each time.  He is having occasional nocturnal bowel movement.  Previously he used to have it often.  He has not had any accidents since he was last seen.  Most of his stools are soft and mushy.  He is still noticing small amount of blood on wiping.  This does not happen every day.  He is not having any difficulty using suppository every night.  His appetite is good.  His weight has been stable.  He says he is working 50% less than he was before but he is getting unemployment pay.  He says free time as given an opportunity to work in his job.  He also walks regularly.  He denies dysuria hematuria or abdominal pain.  He is not having any issues with ventral hernia.  Current Medications: Outpatient Encounter Medications as of 06/13/2018  Medication Sig  . acetaminophen (TYLENOL) 500 MG tablet Take 500 mg by mouth daily as needed for moderate pain or headache.   Marland Kitchen aspirin EC 81 MG tablet Take 1 tablet (81 mg total) by mouth daily.  . diphenhydrAMINE (BENADRYL) 25 MG tablet Take 25 mg by mouth See admin instructions. Take before remicade infusion  . diphenoxylate-atropine (LOMOTIL) 2.5-0.025 MG tablet TAKE 1 TABLET BY MOUTH 4 TIMES DAILY. (Patient taking differently: Take 1 tablet by mouth 4 (four) times daily. )  . ferrous sulfate 325 (65 FE) MG  tablet Take 325 mg by mouth 2 (two) times daily with a meal.  . InFLIXimab (REMICADE IV) Inject 5 mg into the vein. Patient is receiving 5 mg /kg IV every 8 weeks. Next dose is 12/09/2016.  . mesalamine (CANASA) 1000 MG suppository UNWRAP AND INSERT 1  SUPPOSITORY RECTALLY AT  BEDTIME  . Multiple Minerals-Vitamins (CITRACAL PLUS PO) Take 1 tablet by mouth 2 (two) times daily.  . Multiple Vitamin (MULTIVITAMIN WITH MINERALS) TABS tablet Take 1 tablet by mouth daily.  Marland Kitchen saccharomyces boulardii (FLORASTOR) 250 MG capsule Take 1 capsule (250 mg total) by mouth daily. (Patient taking differently: Take 250 mg by mouth at bedtime. )  . vitamin C (ASCORBIC ACID) 500 MG tablet Take 500 mg by mouth daily.   No facility-administered encounter medications on file as of 06/13/2018.      Objective: Blood pressure 108/67, pulse 64, temperature 98.2 F (36.8 C), temperature source Oral, resp. rate 18, height 5' 10"  (1.778 m), weight 168 lb (76.2 kg). Patient is alert and in no acute distress. Conjunctiva is pink. Sclera is nonicteric Oropharyngeal mucosa is normal. No neck masses or thyromegaly noted. Cardiac exam with regular rhythm normal S1 and S2. No murmur or gallop noted. Lungs are clear to auscultation. Abdomen abdomen is symmetrical.  He has lower midline scar as well as colostomy scar on left side and ileostomy scar on the right side.  He has large incisional/ventral hernia which is  completely reducible.  On palpation abdomen is soft and nontender with organomegaly or masses. No LE edema or clubbing noted.  Labs/studies Results: Lab data from 12/12/2017 WBC 5.2, H&H 13.2 and 39.1 and platelet count 182 K.  Assessment:  #1.  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  He appears to be doing well.  However he has not had endoscopic remission.  Infliximab drug and anti-body levels were  checked in January last year and trough level was therapeutic and antibody was was less than 10.  #2.  Chronic diarrhea secondary to loss of most of his colon.  Stool frequency and consistency has improved over the last several months.  He is on Lomotil up to 4 tablets a day.   Plan:  Patient will continue infliximab infusion every 8 weeks, mesalamine suppository every night and diphenoxylate 4 times a day. New prescription given for diphenoxylate 120 doses with 5 refills. He will go to the lab for CBC with differential comprehensive chemistry panel and CRP. Unless patient starts having abdominal pain or change in bowel habits we will plan office visit in 6 months and flexible sigmoidoscopy in December 2020.

## 2018-06-22 DIAGNOSIS — K51219 Ulcerative (chronic) proctitis with unspecified complications: Secondary | ICD-10-CM | POA: Diagnosis not present

## 2018-07-30 DIAGNOSIS — K5190 Ulcerative colitis, unspecified, without complications: Secondary | ICD-10-CM | POA: Diagnosis not present

## 2018-07-30 DIAGNOSIS — Z79899 Other long term (current) drug therapy: Secondary | ICD-10-CM | POA: Diagnosis not present

## 2018-08-17 DIAGNOSIS — K5190 Ulcerative colitis, unspecified, without complications: Secondary | ICD-10-CM | POA: Diagnosis not present

## 2018-08-17 DIAGNOSIS — K51219 Ulcerative (chronic) proctitis with unspecified complications: Secondary | ICD-10-CM | POA: Diagnosis not present

## 2018-08-17 DIAGNOSIS — Z79899 Other long term (current) drug therapy: Secondary | ICD-10-CM | POA: Diagnosis not present

## 2018-10-12 DIAGNOSIS — K51219 Ulcerative (chronic) proctitis with unspecified complications: Secondary | ICD-10-CM | POA: Diagnosis not present

## 2018-11-27 ENCOUNTER — Other Ambulatory Visit (INDEPENDENT_AMBULATORY_CARE_PROVIDER_SITE_OTHER): Payer: Self-pay | Admitting: Internal Medicine

## 2018-12-07 DIAGNOSIS — K51219 Ulcerative (chronic) proctitis with unspecified complications: Secondary | ICD-10-CM | POA: Diagnosis not present

## 2018-12-25 ENCOUNTER — Ambulatory Visit (INDEPENDENT_AMBULATORY_CARE_PROVIDER_SITE_OTHER): Payer: BC Managed Care – PPO | Admitting: Internal Medicine

## 2018-12-25 ENCOUNTER — Other Ambulatory Visit (INDEPENDENT_AMBULATORY_CARE_PROVIDER_SITE_OTHER): Payer: Self-pay | Admitting: Internal Medicine

## 2018-12-25 ENCOUNTER — Other Ambulatory Visit: Payer: Self-pay

## 2018-12-25 ENCOUNTER — Encounter (INDEPENDENT_AMBULATORY_CARE_PROVIDER_SITE_OTHER): Payer: Self-pay | Admitting: Internal Medicine

## 2018-12-25 VITALS — Ht 70.0 in | Wt 167.0 lb

## 2018-12-25 DIAGNOSIS — K51219 Ulcerative (chronic) proctitis with unspecified complications: Secondary | ICD-10-CM

## 2018-12-25 DIAGNOSIS — K529 Noninfective gastroenteritis and colitis, unspecified: Secondary | ICD-10-CM | POA: Diagnosis not present

## 2018-12-25 MED ORDER — DIPHENOXYLATE-ATROPINE 2.5-0.025 MG PO TABS: 1.0000 | ORAL_TABLET | Freq: Four times a day (QID) | ORAL | 5 refills | Status: DC

## 2018-12-25 NOTE — Progress Notes (Signed)
Virtual Visit via Telephone Note  Patient had face-to-face office visit scheduled for today.  Visit was changed to telephone visit because of ongoing Covid-19 pandemic. I connected with Connor Small on 12/25/18 at  3:38 PM EST by telephone and verified that I am speaking with the correct person using two identifiers.  Location: Patient: home Provider: office   I discussed the limitations, risks, security and privacy concerns of performing an evaluation and management service by telephone and the availability of in person appointments. I also discussed with the patient that there may be a patient responsible charge related to this service. The patient expressed understanding and agreed to proceed.   History of Present Illness:  Patient is 58 year old Caucasian male who has history of distal ulcerative colitis dating back to 2004.  His disease has been complicated by sigmoid colon perforation in March 2013 without any explanation treated with sigmoid colon resection and colostomy which was subsequently taken down.  He had fulminant C. difficile colitis in February 2014 leading to subtotal colectomy.  He had prolonged hospitalization with multiple complications but eventually recovered.  He has active disease and rectum and is on infliximab and mesalamine suppositories.  His last flexible sigmoidoscopy was in December 2020. His last office visit was on 06/13/2018.  He says he is doing well.  He needs refill on diphenoxylate.  He is taking 4 doses a day.  He is not experience any side effects or drowsiness.  His stool frequency ranges between 5 and 7 stools every 24 hours.  Stool consistency is mushy to formed.  Stool caliber is a sized thumb.  Stool caliber has not decreased.  He denies abdominal pain nausea or vomiting.  He has very good appetite.  His weight has remained stable this year.  He reports blood on the tissue only if he does something strenuous.  Few days ago he use a chainsaw to cut a  tree and next day he noted blood in the tissue.  He is not experiencing any side effects with infliximab. He had blood work in July 2020 had infusion clinic which is reviewed below.  Current Outpatient Medications  Medication Sig Dispense Refill  . acetaminophen (TYLENOL) 500 MG tablet Take 500 mg by mouth daily as needed for moderate pain or headache.     Marland Kitchen aspirin EC 81 MG tablet Take 1 tablet (81 mg total) by mouth daily.    . diphenhydrAMINE (BENADRYL) 25 MG tablet Take 25 mg by mouth See admin instructions. Take before remicade infusion    . ferrous sulfate 325 (65 FE) MG tablet Take 325 mg by mouth 2 (two) times daily with a meal.    . InFLIXimab (REMICADE IV) Inject 5 mg into the vein. Patient is receiving 5 mg /kg IV every 8 weeks. Next dose is 12/09/2016.    . mesalamine (CANASA) 1000 MG suppository UNWRAP AND INSERT 1  SUPPOSITORY RECTALLY AT  BEDTIME 90 suppository 3  . Multiple Minerals-Vitamins (CITRACAL PLUS PO) Take 1 tablet by mouth 2 (two) times daily.    . Multiple Vitamin (MULTIVITAMIN WITH MINERALS) TABS tablet Take 1 tablet by mouth daily.    Marland Kitchen saccharomyces boulardii (FLORASTOR) 250 MG capsule Take 1 capsule (250 mg total) by mouth daily. (Patient taking differently: Take 250 mg by mouth at bedtime. ) 90 capsule 3  . vitamin C (ASCORBIC ACID) 500 MG tablet Take 500 mg by mouth daily.    . diphenoxylate-atropine (LOMOTIL) 2.5-0.025 MG tablet Take 1 tablet by mouth  4 (four) times daily. 120 tablet 5   No current facility-administered medications for this visit.     Observations/Objective:  Patient reported his weight to be 167 pounds.  Lab data from 08/17/2018 WBC 5.3 H&H 13.7 and 39.6 Platelet count 172K.  CRP 2(normal 0-10 mg/L)  Glucose 90 BUN 21 and creatinine 1.18(0.76 -1.27). Serum sodium 140, potassium 4.2, chloride 102, CO2 23 Serum calcium 8.9 Bilirubin 0.5, AP 67, AST 27, ALT 16, total protein 6.0 and albumin 3.6.   QuantiFERON-TB gold plus  negative   Assessment and Plan:  #1.  Ulcerative proctitis.  Patient has history of pan ulcerative colitis and lost most of his colon due to fulminant C. difficile colitis in March 15, 2012.  Last flexible sigmoidoscopy was 1 year ago revealing active disease and noncritical ileorectal anastomosis.  Symptomatically he appears to be in remission.  Stool frequency is well controlled with diphenoxylate.  Will hold off flexible sigmoidoscopy until next year.  He will continue infliximab every 8 weeks and mesalamine suppository every night.   Follow Up Instructions:  New prescription for diphenoxylate 1 tablet 4 times daily sent to patient's pharmacy for 1 month with 5 refills. Patient will call office if he develops change in caliber of stool frank rectal bleeding or abdominal pain. Office visit in 6 months. I discussed the assessment and treatment plan with the patient. The patient was provided an opportunity to ask questions and all were answered. The patient agreed with the plan and demonstrated an understanding of the instructions.   The patient was advised to call back or seek an in-person evaluation if the symptoms worsen or if the condition fails to improve as anticipated.  I provided  12 minutes of non-face-to-face time during this encounter.   Hildred Laser, MD

## 2018-12-25 NOTE — Patient Instructions (Signed)
Patient will call if he develops frank rectal bleeding or abdominal pain not relieved with defecation.

## 2019-02-01 DIAGNOSIS — K51219 Ulcerative (chronic) proctitis with unspecified complications: Secondary | ICD-10-CM | POA: Diagnosis not present

## 2019-03-27 ENCOUNTER — Encounter (INDEPENDENT_AMBULATORY_CARE_PROVIDER_SITE_OTHER): Payer: Self-pay

## 2019-03-27 ENCOUNTER — Telehealth (INDEPENDENT_AMBULATORY_CARE_PROVIDER_SITE_OTHER): Payer: Self-pay | Admitting: *Deleted

## 2019-03-27 NOTE — Telephone Encounter (Signed)
Hi Dr. Laural Golden, I hope you're doing well. I'm trying to decide if I should receive the Covid-19 vaccine or not. Based on my complicated medical history, I have concerns. (I'm aware of the pros associated with receiving the vaccine, but I would also like to be aware of the "cons" based on my medical history.) I would appreciate any advise you could provide. Thank you! Connor Small

## 2019-03-29 ENCOUNTER — Encounter (INDEPENDENT_AMBULATORY_CARE_PROVIDER_SITE_OTHER): Payer: Self-pay | Admitting: *Deleted

## 2019-03-29 DIAGNOSIS — K51219 Ulcerative (chronic) proctitis with unspecified complications: Secondary | ICD-10-CM | POA: Diagnosis not present

## 2019-03-29 NOTE — Telephone Encounter (Signed)
Patient was sent the responds through My Chart.

## 2019-03-29 NOTE — Telephone Encounter (Signed)
I would recommend that he should proceed with Covid vaccine unless he is not comfortable.

## 2019-05-24 DIAGNOSIS — K51219 Ulcerative (chronic) proctitis with unspecified complications: Secondary | ICD-10-CM | POA: Diagnosis not present

## 2019-06-25 ENCOUNTER — Other Ambulatory Visit: Payer: Self-pay

## 2019-06-25 ENCOUNTER — Ambulatory Visit (INDEPENDENT_AMBULATORY_CARE_PROVIDER_SITE_OTHER): Payer: BC Managed Care – PPO | Admitting: Gastroenterology

## 2019-06-25 ENCOUNTER — Encounter (INDEPENDENT_AMBULATORY_CARE_PROVIDER_SITE_OTHER): Payer: Self-pay | Admitting: Gastroenterology

## 2019-06-25 VITALS — BP 99/65 | HR 62 | Temp 97.3°F | Ht 70.0 in | Wt 171.8 lb

## 2019-06-25 DIAGNOSIS — K51219 Ulcerative (chronic) proctitis with unspecified complications: Secondary | ICD-10-CM

## 2019-06-25 NOTE — Patient Instructions (Addendum)
I will discuss timing of colonoscopy with Dr Laural Golden and contact you with updated recommendations. Labs ordered today.

## 2019-06-25 NOTE — Progress Notes (Signed)
Patient profile: Connor Small is a 59 y.o. male seen for f/up of ulcerative proctitis. Last seen in clinic 12/2018.  History of Present Illness: Connor Small is seen today for evaluation of chronic history of ulcerative colitis. He reports his symptoms are at baseline which is 5-8 stools a day that are soft consistency, occasionally formed. He denies liquid stools and significant urgency.  He has very scant bright red blood but feels this is most likely hemorrhoid related.  He was previously on Lomotil at his last visit but is no longer needing this.  He is typically using suppositories once a day, does occasionally have missed doses but does not note any increase in symptoms with missed doses.  He is feeling well from a upper GI standpoint and denies any nausea vomiting.  Has occasional GERD if he eats watermelon, otherwise doing well.  No dysphagia.  Not on PPI.  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   HAP (hospital-acquired pneumonia) 04/21/2012   Hemorrhoids    History of acute renal failure    post-op  total colectomy 02-27-2012--  required dialysis--  resolved   History of ARDS    02-27-2012  post op total colectomy -- septic shock-  required intubation/ventilator for 3-4 wks   History of atrial fibrillation    02/ 2014--  no issues since   History of Clostridium difficile colitis    02-27-2012--  fulminant C.Diff colitis caused toxic megacolon--  s/p  total  colectomy   History of DVT (deep vein thrombosis)    2014--  right upper extremity   History of pleural effusion    acute- bilateral in setting septic shock post op Feb 2014   History of septic shock dx's at this time--  ARDS, ARF, CVA, A-Fib   Feb 2014--  post op total colectomy from toxic megacolon/ fulminant C.Diff Colitis   History of stroke March 2014 --  residual right weaker than prior to cva, unable to make fist   Bilateral cerebral and right cerebellar infarcts - felt to be embolic vs hypotensive (in setting of septic shock, transient a-fib, ARDS)   PAF (paroxysmal atrial fibrillation) ([place]) 03/15/2012   Rectal bleed    Renal calculi    bilateral--  left > right   Ulcerative colitis followed by dr Laural Golden   Distal UC dx 2004   Urgency of urination     Problem List: Patient Active Problem List   Diagnosis Date Noted   Ulcerative proctitis with complication ([place]) 38/25/0539   Chronic ulcerative proctitis with complication ([place]) 76/73/4193   Chronic diarrhea 10/28/2015   Absolute anemia 10/28/2015   Dysphagia 10/28/2015   Diarrhea 10/28/2015   Abdominal wall abscess    Abscess    UTI (lower urinary tract infection) 08/26/2014   Fever 08/26/2014   Renal calculi    Ulcerative colitis ([place])    Palpitations 11/21/2013   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  Right hand weakness 08/10/2012   CKD (chronic kidney disease) stage 2, GFR 60-89 ml/min 04/26/2012   Shoulder subluxation, right 03/23/2012   CVA, 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 15/17/6160   Cerebral artery occlusion with cerebral infarction ([place]) 03/23/2012   Leukocytosis 03/15/2012   Anemia of chronic disease 03/15/2012   A-fib ([place]) 03/15/2012    Past Surgical History: Past Surgical History:  Procedure Laterality Date   APPLICATION OF WOUND VAC N/A 03/26/2012   Procedure: APPLICATION OF WOUND VAC;  Surgeon: Edward Jolly, MD;  Location: [place];  Service: General;  Laterality: N/A;   BALLOON DILATION N/A 01/15/2018   Procedure: [place];  Surgeon: Rogene Houston, MD;  [facility];  Service: Endoscopy;  Laterality: N/A;  ileorectum   BIOPSY  01/19/2017   Procedure: BIOPSY;  Surgeon: Rogene Houston, MD;  [facility];  Service: Endoscopy;;  rectal   CECOSTOMY  02/27/2012   Procedure: CECOSTOMY;  Surgeon: Donato Heinz, MD;  [facility];  Service: General;  Laterality: N/A;  Cecostomy Tube Placement   COLECTOMY  02/28/2012   Procedure: TOTAL COLECTOMY;  Surgeon: Donato Heinz, MD;  [facility];  Service: General;  Laterality: N/A;   COLONOSCOPY  02/16/2011   Procedure: COLONOSCOPY;  Surgeon: Rogene Houston, MD;  [facility];  Service: Endoscopy;  Laterality: N/A;  100   CYSTOSCOPY W/ URETERAL STENT PLACEMENT Bilateral 03/20/2012   Procedure: CYSTOSCOPY WITH RETROGRADE PYELOGRAM/;  Surgeon: Alexis Frock, MD;  Location: [place];  Service: Urology;  Laterality: Bilateral. Pt reports no stents were placed.   CYSTOSCOPY WITH RETROGRADE PYELOGRAM, URETEROSCOPY AND STENT PLACEMENT Left 07/16/2014   Procedure: CYSTOSCOPY WITH RETROGRADE PYELOGRAM,  AND LEFT STENT PLACEMENT;  Surgeon: Alexis Frock, MD;  [facility];  Service: Urology;  Laterality: Left;   CYSTOSCOPY WITH RETROGRADE PYELOGRAM, URETEROSCOPY AND STENT PLACEMENT Bilateral 07/25/2014   Procedure: CYSTOSCOPY WITH RETROGRADE PYELOGRAM, URETEROSCOPY AND BILATERAL STENT PLACEMENT;  Surgeon: Alexis Frock, MD;  Location: [facility];  Service: Urology;  Laterality: Bilateral;   ESOPHAGOGASTRODUODENOSCOPY N/A 01/14/2016   Procedure: ESOPHAGOGASTRODUODENOSCOPY (EGD);  Surgeon: Rogene Houston, MD;  [facility];  Service: Endoscopy;  Laterality: N/A;   FLEXIBLE SIGMOIDOSCOPY  02/27/2012   Procedure: FLEXIBLE SIGMOIDOSCOPY;  Surgeon: Rogene Houston, MD;  [facility];  Service: Endoscopy;  Laterality: N/A;  with colonic decompression   FLEXIBLE SIGMOIDOSCOPY N/A 02/06/2013   Procedure: FLEXIBLE SIGMOIDOSCOPY;  Surgeon: Rogene Houston, MD;  [facility];  Service: Endoscopy;  Laterality: N/A;  100-moved to 1200 Ann notified pt   FLEXIBLE SIGMOIDOSCOPY N/A 03/27/2013   Procedure: FLEXIBLE SIGMOIDOSCOPY;  Surgeon: Rogene Houston, MD;  [facility];  Service: Endoscopy;  Laterality: N/A;  [place] N/A 12/11/2013   Procedure: FLEXIBLE SIGMOIDOSCOPY;  Surgeon: Rogene Houston, MD;  [facility];  Service: Endoscopy;  Laterality: N/A;  200   FLEXIBLE SIGMOIDOSCOPY N/A 01/07/2015   Procedure: FLEXIBLE SIGMOIDOSCOPY;  Surgeon: Rogene Houston, MD;  [facility];  Service: Endoscopy;  Laterality: N/A;  [place] N/A 01/14/2016   Procedure: FLEXIBLE SIGMOIDOSCOPY;  Surgeon: Rogene Houston, MD;  [facility];  Service: Endoscopy;  Laterality: N/A;   FLEXIBLE SIGMOIDOSCOPY N/A 01/19/2017   Procedure: FLEXIBLE SIGMOIDOSCOPY;  Surgeon: Rogene Houston, MD;  [facility];  Service: Endoscopy;  Laterality: N/A;  1200  FLEXIBLE SIGMOIDOSCOPY N/A 01/15/2018   Procedure: FLEXIBLE SIGMOIDOSCOPY;  Surgeon: Rogene Houston, MD;  [facility];  Service: Endoscopy;  Laterality: N/A;  9:30   HOLMIUM LASER APPLICATION Left 7/0/3500   Procedure: HOLMIUM LASER APPLICATION, LEFT ONLY ;  Surgeon: Alexis Frock, MD;  [facility];  Service: Urology;  Laterality: Left;   ILEOSTOMY  02/28/2012   Procedure: ILEOSTOMY;  Surgeon: Donato Heinz, MD;  [facility];  Service: General;  Laterality: N/A;   ILEOSTOMY CLOSURE N/A 05/27/2013   Procedure: ILEOSTOMY TAKEDOWN WITH ILEOPROCTOSTOMY;  Surgeon: Edward Jolly, MD;  [facility];  Service: General;  Laterality: N/A;   LAPAROTOMY  02/27/2012   Procedure: EXPLORATORY  LAPAROTOMY;  Surgeon: Donato Heinz, MD;  [facility];  Service: General;  Laterality: N/A;   LAPAROTOMY N/A 03/26/2012   Procedure: EXPLORATORY LAPAROTOMY;  Surgeon: Edward Jolly, MD;  Location: [place];  Service: General;  Laterality: N/A;   LAPAROTOMY N/A 03/29/2012   Procedure: EXPLORATORY LAPAROTOMY, PARTIAL WOUND CLOSURE;  Surgeon: Edward Jolly, MD;  Location: [place];  Service: General;  Laterality: N/A;   LIVER BIOPSY N/A 03/26/2012   Procedure: LIVER BIOPSY;  Surgeon: Edward Jolly, MD;  Location: [place];  Service: General;  Laterality: N/A;   SIGMOID COLECTOMY /  LOW ANTERIOR RESECTION/  END SIGMOID COLOSTOMY  11-07-2010   [place]   Sigmoid colonic perferation---  TAKE DOWN COLOSTOMY,  Jan 2013   TRANSTHORACIC ECHOCARDIOGRAM  03-26-2012  (in setting of septic shock)   report states  poorly visualized--  normal LVSF, ef 50-55% and no pericardial effusion   VACUUM ASSISTED CLOSURE CHANGE N/A 03/29/2012   Procedure: Open ABDOMINAL VACUUM  CHANGE;  Surgeon: Edward Jolly, MD;  Location: [place];  Service: General;  Laterality: N/A;   VACUUM ASSISTED CLOSURE CHANGE N/A 04/01/2012   Procedure: removal of abdominal vac dressing and abdominal closure;  Surgeon: Edward Jolly, MD;  Location: [place];  Service: General;  Laterality: N/A;    Allergies: Allergies  Allergen Reactions   Penicillins Other (See Comments)    "Heart rate slowed down"  bradycardia DID THE REACTION INVOLVE: Swelling of the face/tongue/throat, SOB, or low BP? Yes Sudden or severe rash/hives, skin peeling, or the inside of the mouth or nose? No Did it require medical treatment? Yes When did it last happen?years  If all above answers are NO, may proceed with cephalosporin use.     Clindamycin/Lincomycin Diarrhea    C-diff   Morphine And Related Nausea And Vomiting   Claritin [Loratadine] Rash   Contrast Media [Iodinated Diagnostic Agents] Hives    Patient reports hives  after IV contrast.    Primaxin [Imipenem] Rash      Home Medications:  Current Outpatient Medications:    acetaminophen (TYLENOL) 500 MG tablet, Take 500 mg by mouth daily as needed for moderate pain or headache. , Disp: , Rfl:    aspirin EC 81 MG tablet, Take 1 tablet (81 mg total) by mouth daily., Disp: , Rfl:    diphenhydrAMINE (BENADRYL) 25 MG tablet, Take 25 mg by mouth See admin instructions. Take before remicade infusion, Disp: , Rfl:    ferrous sulfate 325 (65 FE) MG tablet, Take 325 mg by mouth 2 (two) times daily with a meal., Disp: , Rfl:    InFLIXimab (REMICADE IV), Inject 5 mg into the vein. Patient is receiving 5 mg /kg IV every 8 weeks. Next dose is  12/09/2016., Disp: , Rfl:    mesalamine (CANASA) 1000 MG suppository, UNWRAP AND INSERT 1  SUPPOSITORY RECTALLY AT  BEDTIME, Disp: 90 suppository, Rfl: 3   Multiple Minerals-Vitamins (CITRACAL PLUS PO), Take 1 tablet by mouth 2 (two) times daily., Disp: , Rfl:    Multiple Vitamin (MULTIVITAMIN WITH MINERALS) TABS tablet, Take 1 tablet by mouth daily., Disp: , Rfl:    saccharomyces boulardii (FLORASTOR) 250 MG capsule, Take 1 capsule (250 mg total) by mouth daily. (Patient taking differently: Take 250 mg by mouth at bedtime. ), Disp: 90 capsule, Rfl: 3   vitamin C (ASCORBIC ACID) 500 MG tablet, Take 500 mg by mouth daily., Disp: , Rfl:    diphenoxylate-atropine (LOMOTIL) 2.5-0.025 MG tablet, Take 1 tablet by mouth 4 (four) times daily. (Patient not taking: Reported on 06/25/2019), Disp: 120 tablet, Rfl: 5   Family History: family history includes Cancer in his sister; Healthy in his daughter; Hypertension in his mother.    Social History:   reports that he quit smoking about 34 years ago. His smoking use included cigarettes. He has a 6.00 pack-year smoking history. He has never used smokeless tobacco. He reports that he does not drink alcohol or use drugs.   Review of Systems: Constitutional: Denies weight loss/weight  gain  Eyes: No changes in vision. ENT: No oral lesions, sore throat.  GI: see HPI.  Heme/Lymph: No easy bruising.  CV: No chest pain.  GU: No hematuria.  Integumentary: No rashes.  Neuro: No headaches.  Psych: No depression/anxiety.  Endocrine: No heat/cold intolerance.  Allergic/Immunologic: No urticaria.  Resp: No cough, SOB.  Musculoskeletal: No joint swelling.    Physical Examination: BP 99/65 (BP Location: Right Arm, Patient Position: Sitting, Cuff Size: Large)    Pulse 62    Temp (!) 97.3 F (36.3 C) (Temporal)    Ht 5' 10"  (1.778 m)    Wt 171 lb 12.8 oz (77.9 kg)    BMI 24.65 kg/m  Gen: NAD, alert and oriented x 4 HEENT: PEERLA, EOMI, Neck: supple, no JVD Chest: CTA bilaterally, no wheezes, crackles, or other adventitious sounds CV: RRR, no m/g/c/r Abd: soft, NT, ND, +BS in all four quadrants; no HSM, guarding, ridigity, or rebound tenderness Ext: no edema, well perfused with 2+ pulses, Skin: no rash or lesions noted on observed skin Lymph: no noted LAD  Data Reviewed: Labs July 2020-CBC and CMP normal, CRP 2    Assessment/Plan: Mr. Fontaine is a 59 y.o. male   1.  Ulcerative proctitis -history of pancolitis and had subtotal colectomy 2014 due to C. Difficile, last flex sig 2019 with active disease.  He is no longer needing Lomotil for symptom control.  He is currently on Remicade every 8 weeks and feels he is clinically in remission.  He denies any increasing symptoms between Remicade doses.  He will continue mesalamine suppositories nightly.  We will discuss with Dr. Laural Golden timing of next endoscopic evaluation with flexible   He was given an order today for his routine labs which were last done about a year ago.   I personally performed the service, non-incident to. (WP)  Laurine Blazer, [facility]

## 2019-06-28 ENCOUNTER — Telehealth (INDEPENDENT_AMBULATORY_CARE_PROVIDER_SITE_OTHER): Payer: Self-pay | Admitting: Gastroenterology

## 2019-06-28 NOTE — Telephone Encounter (Signed)
I called pt w/ updated recommendations from Dr Laural Golden, discussed w/ wife - okay to wait for 12/2019 for flex sig.   Mitzie - patient will need OV f/up with me or NUR for Dec 2021. Thanks!

## 2019-07-19 DIAGNOSIS — K51219 Ulcerative (chronic) proctitis with unspecified complications: Secondary | ICD-10-CM | POA: Diagnosis not present

## 2019-08-09 DIAGNOSIS — K5190 Ulcerative colitis, unspecified, without complications: Secondary | ICD-10-CM | POA: Diagnosis not present

## 2019-08-09 DIAGNOSIS — Z79899 Other long term (current) drug therapy: Secondary | ICD-10-CM | POA: Diagnosis not present

## 2019-09-25 ENCOUNTER — Other Ambulatory Visit (INDEPENDENT_AMBULATORY_CARE_PROVIDER_SITE_OTHER): Payer: Self-pay

## 2019-09-25 ENCOUNTER — Other Ambulatory Visit (INDEPENDENT_AMBULATORY_CARE_PROVIDER_SITE_OTHER): Payer: Self-pay | Admitting: *Deleted

## 2019-09-25 ENCOUNTER — Encounter (INDEPENDENT_AMBULATORY_CARE_PROVIDER_SITE_OTHER): Payer: Self-pay

## 2019-09-25 ENCOUNTER — Telehealth (INDEPENDENT_AMBULATORY_CARE_PROVIDER_SITE_OTHER): Payer: Self-pay

## 2019-09-25 ENCOUNTER — Encounter (INDEPENDENT_AMBULATORY_CARE_PROVIDER_SITE_OTHER): Payer: Self-pay | Admitting: *Deleted

## 2019-09-25 DIAGNOSIS — K51219 Ulcerative (chronic) proctitis with unspecified complications: Secondary | ICD-10-CM

## 2019-09-25 DIAGNOSIS — K625 Hemorrhage of anus and rectum: Secondary | ICD-10-CM

## 2019-09-25 NOTE — Telephone Encounter (Signed)
Called spoke with patient , per Dr. Laural Golden  will need to have a CBC , Fecal Clarification , and [place] Flex scopy  , put in the order for pt to have this done. Called and explained in detail to the patient to have have lab done upstairs and put up test kit for Fecal Test from [facility], and Lelon Frohlich will call patient will information for Flex scopy.

## 2019-09-26 ENCOUNTER — Telehealth (INDEPENDENT_AMBULATORY_CARE_PROVIDER_SITE_OTHER): Payer: Self-pay | Admitting: Internal Medicine

## 2019-09-26 ENCOUNTER — Encounter (INDEPENDENT_AMBULATORY_CARE_PROVIDER_SITE_OTHER): Payer: Self-pay | Admitting: *Deleted

## 2019-09-26 ENCOUNTER — Telehealth (INDEPENDENT_AMBULATORY_CARE_PROVIDER_SITE_OTHER): Payer: Self-pay | Admitting: *Deleted

## 2019-09-26 NOTE — Telephone Encounter (Signed)
Connor Small [id number]   PLEASE READ ALL INSTRUCTIONS CAREFULLY  BEFORE YOUR PROCEDURE with [place] is requiring all patients who are scheduled for outpatient procedures be tested for COVID-19 2 days prior to procedure.  This will be done at [facility] on 10/02/2019.  Once you are tested you will need to be in quarantine at home until you have your procedure. Don't take pets with you and if you smoke, don't smoke 24 hours before your appointment.  Procedure date and time to arrive at [facility] Stay:10/04/2019.   If you have any questions please call Ann at [phone].   You must have a responsible adult available to drive you home and be with you after the procedure.  If you do not have an available adult the hospital will not do the procedure.  You can take your medication as normal unless something is listed below to hold or adjust.      You will need to purchase 1 fleet enemas.  You do not need a prescription for these.   On 10/03/2019   You may NOT have anything to eat or drink after midnight the night before the procedure.  On the morning of the procedure you will need to give yourself 1 fleet enema at 9:00am

## 2019-09-26 NOTE — Telephone Encounter (Signed)
Spouse would like for you to change the time of patients covid test in mychart to 3:15 - she wants to have it so there is no confusion

## 2019-09-27 ENCOUNTER — Encounter (INDEPENDENT_AMBULATORY_CARE_PROVIDER_SITE_OTHER): Payer: Self-pay | Admitting: *Deleted

## 2019-09-27 LAB — CBC
HCT: 37.6 % — ABNORMAL LOW (ref 38.5–50.0)
Hemoglobin: 13.1 g/dL — ABNORMAL LOW (ref 13.2–17.1)
MCH: 33.5 pg — ABNORMAL HIGH (ref 27.0–33.0)
MCHC: 34.8 g/dL (ref 32.0–36.0)
MCV: 96.2 fL (ref 80.0–100.0)
MPV: 10 fL (ref 7.5–12.5)
Platelets: 192 10*3/uL (ref 140–400)
RBC: 3.91 10*6/uL — ABNORMAL LOW (ref 4.20–5.80)
RDW: 12.6 % (ref 11.0–15.0)
WBC: 6.1 10*3/uL (ref 3.8–10.8)

## 2019-09-27 NOTE — Telephone Encounter (Signed)
This has been done.

## 2019-10-02 ENCOUNTER — Other Ambulatory Visit: Payer: Self-pay

## 2019-10-02 ENCOUNTER — Other Ambulatory Visit (HOSPITAL_COMMUNITY)
Admission: RE | Admit: 2019-10-02 | Discharge: 2019-10-02 | Disposition: A | Discharge status: Home / Self Care | Payer: BC Managed Care – PPO | Source: Ambulatory Visit | Attending: Internal Medicine | Admitting: Internal Medicine

## 2019-10-02 DIAGNOSIS — Z01812 Encounter for preprocedural laboratory examination: Secondary | ICD-10-CM | POA: Diagnosis not present

## 2019-10-02 DIAGNOSIS — Z20822 Contact with and (suspected) exposure to covid-19: Secondary | ICD-10-CM | POA: Insufficient documentation

## 2019-10-02 LAB — SARS CORONAVIRUS 2 (TAT 6-24 HRS): SARS Coronavirus 2: NEGATIVE

## 2019-10-03 ENCOUNTER — Encounter (INDEPENDENT_AMBULATORY_CARE_PROVIDER_SITE_OTHER): Payer: Self-pay | Admitting: Internal Medicine

## 2019-10-03 LAB — CALPROTECTIN: Calprotectin: 265 mcg/g — ABNORMAL HIGH

## 2019-10-04 ENCOUNTER — Other Ambulatory Visit: Payer: Self-pay

## 2019-10-04 ENCOUNTER — Ambulatory Visit (HOSPITAL_COMMUNITY)
Admission: RE | Admit: 2019-10-04 | Discharge: 2019-10-04 | Disposition: A | Discharge status: Home / Self Care | Payer: BC Managed Care – PPO | Attending: Internal Medicine | Admitting: Internal Medicine

## 2019-10-04 ENCOUNTER — Encounter (HOSPITAL_COMMUNITY)
Admission: RE | Disposition: A | Discharge status: Home / Self Care | Payer: Self-pay | Source: Home / Self Care | Attending: Internal Medicine

## 2019-10-04 ENCOUNTER — Encounter (HOSPITAL_COMMUNITY): Payer: Self-pay | Admitting: Internal Medicine

## 2019-10-04 DIAGNOSIS — I480 Paroxysmal atrial fibrillation: Secondary | ICD-10-CM | POA: Diagnosis not present

## 2019-10-04 DIAGNOSIS — Z8673 Personal history of transient ischemic attack (TIA), and cerebral infarction without residual deficits: Secondary | ICD-10-CM | POA: Diagnosis not present

## 2019-10-04 DIAGNOSIS — Z888 Allergy status to other drugs, medicaments and biological substances status: Secondary | ICD-10-CM | POA: Insufficient documentation

## 2019-10-04 DIAGNOSIS — Z8249 Family history of ischemic heart disease and other diseases of the circulatory system: Secondary | ICD-10-CM | POA: Insufficient documentation

## 2019-10-04 DIAGNOSIS — Z881 Allergy status to other antibiotic agents status: Secondary | ICD-10-CM | POA: Insufficient documentation

## 2019-10-04 DIAGNOSIS — Z885 Allergy status to narcotic agent status: Secondary | ICD-10-CM | POA: Insufficient documentation

## 2019-10-04 DIAGNOSIS — Z87891 Personal history of nicotine dependence: Secondary | ICD-10-CM | POA: Diagnosis not present

## 2019-10-04 DIAGNOSIS — Z880 Allergy status to penicillin: Secondary | ICD-10-CM | POA: Insufficient documentation

## 2019-10-04 DIAGNOSIS — Z86718 Personal history of other venous thrombosis and embolism: Secondary | ICD-10-CM | POA: Insufficient documentation

## 2019-10-04 DIAGNOSIS — T8189XA Other complications of procedures, not elsewhere classified, initial encounter: Secondary | ICD-10-CM | POA: Diagnosis not present

## 2019-10-04 DIAGNOSIS — Z79899 Other long term (current) drug therapy: Secondary | ICD-10-CM | POA: Diagnosis not present

## 2019-10-04 DIAGNOSIS — K51211 Ulcerative (chronic) proctitis with rectal bleeding: Secondary | ICD-10-CM | POA: Insufficient documentation

## 2019-10-04 DIAGNOSIS — Z8619 Personal history of other infectious and parasitic diseases: Secondary | ICD-10-CM | POA: Diagnosis not present

## 2019-10-04 DIAGNOSIS — Z7982 Long term (current) use of aspirin: Secondary | ICD-10-CM | POA: Diagnosis not present

## 2019-10-04 DIAGNOSIS — K91858 Other complications of intestinal pouch: Secondary | ICD-10-CM | POA: Insufficient documentation

## 2019-10-04 DIAGNOSIS — K625 Hemorrhage of anus and rectum: Secondary | ICD-10-CM

## 2019-10-04 DIAGNOSIS — Z8719 Personal history of other diseases of the digestive system: Secondary | ICD-10-CM | POA: Diagnosis not present

## 2019-10-04 DIAGNOSIS — Z9049 Acquired absence of other specified parts of digestive tract: Secondary | ICD-10-CM | POA: Insufficient documentation

## 2019-10-04 DIAGNOSIS — K51212 Ulcerative (chronic) proctitis with intestinal obstruction: Secondary | ICD-10-CM | POA: Diagnosis not present

## 2019-10-04 DIAGNOSIS — K624 Stenosis of anus and rectum: Secondary | ICD-10-CM | POA: Insufficient documentation

## 2019-10-04 DIAGNOSIS — K56609 Unspecified intestinal obstruction, unspecified as to partial versus complete obstruction: Secondary | ICD-10-CM | POA: Diagnosis not present

## 2019-10-04 DIAGNOSIS — K51219 Ulcerative (chronic) proctitis with unspecified complications: Secondary | ICD-10-CM

## 2019-10-04 DIAGNOSIS — K921 Melena: Secondary | ICD-10-CM | POA: Diagnosis not present

## 2019-10-04 DIAGNOSIS — Y832 Surgical operation with anastomosis, bypass or graft as the cause of abnormal reaction of the patient, or of later complication, without mention of misadventure at the time of the procedure: Secondary | ICD-10-CM | POA: Diagnosis not present

## 2019-10-04 DIAGNOSIS — K621 Rectal polyp: Secondary | ICD-10-CM | POA: Insufficient documentation

## 2019-10-04 HISTORY — PX: FLEXIBLE SIGMOIDOSCOPY: SHX5431

## 2019-10-04 HISTORY — PX: POLYPECTOMY: SHX5525

## 2019-10-04 SURGERY — SIGMOIDOSCOPY, FLEXIBLE
Anesthesia: Moderate Sedation

## 2019-10-04 MED ORDER — STERILE WATER FOR IRRIGATION IR SOLN
Status: DC | PRN
  Administered 2019-10-04: 1.5 mL

## 2019-10-04 MED ORDER — MIDAZOLAM HCL 5 MG/5ML IJ SOLN
INTRAMUSCULAR | Status: AC
  Filled 2019-10-04: qty 10

## 2019-10-04 MED ORDER — MEPERIDINE HCL 50 MG/ML IJ SOLN
INTRAMUSCULAR | Status: AC
  Filled 2019-10-04: qty 1

## 2019-10-04 MED ORDER — SODIUM CHLORIDE 0.9 % IV SOLN: INTRAVENOUS | Status: DC

## 2019-10-04 MED ORDER — MIDAZOLAM HCL 5 MG/5ML IJ SOLN
INTRAMUSCULAR | Status: DC | PRN
  Administered 2019-10-04 (×2): 1 mg via INTRAVENOUS
  Administered 2019-10-04: 2 mg via INTRAVENOUS
  Administered 2019-10-04: 1 mg via INTRAVENOUS
  Administered 2019-10-04: 2 mg via INTRAVENOUS
  Administered 2019-10-04: 1 mg via INTRAVENOUS

## 2019-10-04 MED ORDER — MEPERIDINE HCL 25 MG/ML IJ SOLN
INTRAMUSCULAR | Status: DC | PRN
Start: 2019-10-04 — End: 2019-10-04
  Administered 2019-10-04 (×2): 25 mg via INTRAVENOUS

## 2019-10-04 NOTE — Discharge Instructions (Signed)
No aspirin or NSAIDs. Resume usual medications and diet as before. No driving for 24 hours. Physician will call with biopsy results.   Flexible Sigmoidoscopy, Care After This sheet gives you information about how to care for yourself after your procedure. Your health care provider may also give you more specific instructions. If you have problems or questions, contact your health care provider.  Dr Laural Golden:  [phone]  What can I expect after the procedure? After the procedure, it is common to have:  Abdominal cramping or pain.  Bloating.  A small amount of rectal bleeding if you had a biopsy. Follow these instructions at home:  Take over-the-counter and prescription medicines only as told by your health care provider. NO ASPIRIN OR NSAIDS.  Do not drive for 24 hours if you received a medicine to help you relax (sedative).  Keep all follow-up visits as told by your health care provider. This is important.   Contact a health care provider if:  You have abdominal pain or cramping that gets worse or is not helped with medicine.  You continue to have small amounts of rectal bleeding after 24 hours.  You have nausea or vomiting.  You feel weak or dizzy.  You have a fever. Get help right away if:   You pass large blood clots or see a large amount of blood in the toilet after having a bowel movement.  You have nausea or vomiting for more than 24 hours after the procedure. This information is not intended to replace advice given to you by your health care provider. Make sure you discuss any questions you have with your health care provider. Document Revised: 09/03/2015 Document Reviewed: 04/11/2015 Elsevier Patient Education  [place].

## 2019-10-04 NOTE — H&P (Signed)
Connor Small is an 59 y.o. male.   Chief Complaint: Patient is here for flexible sigmoidoscopy. HPI: Patient is 60 year old Caucasian male with complicated GI history.  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  He has been maintained on infliximab for the past few years.  His last sigmoidoscopy was in December 2019 revealing active disease.  He has been doing well.  Last week he noted an episode of rectal bleeding and the blood dripped out.  He denies anorectal or abdominal pain.  He generally has 5-8 stools per day.  He has good appetite.  He is maintaining his weight. We checked his lab and his hemoglobin was 13.1 Fecal calprotectin is elevated at 265. Covid test is negative.  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   HAP (hospital-acquired pneumonia) 04/21/2012   Hemorrhoids    History of acute renal failure    post-op  total colectomy 02-27-2012--  required dialysis--  resolved   History of ARDS    02-27-2012  post op total colectomy -- septic shock-  required intubation/ventilator for 3-4 wks   History of atrial fibrillation    02/ 2014--  no issues since   History of Clostridium difficile colitis    02-27-2012--  fulminant C.Diff colitis caused toxic megacolon--  s/p  total colectomy   History of DVT (deep vein thrombosis)    2014--  right upper extremity   History of pleural effusion    acute- bilateral in setting septic shock post op Feb 2014   History of septic shock dx's at this time--  ARDS, ARF, CVA, A-Fib   Feb 2014--  post op total colectomy from toxic megacolon/ fulminant C.Diff Colitis   History of  stroke March 2014 --  residual right weaker than prior to cva, unable to make fist   Bilateral cerebral and right cerebellar infarcts - felt to be embolic vs hypotensive (in setting of septic shock, transient a-fib, ARDS)   PAF (paroxysmal atrial fibrillation) ([place]) 03/15/2012   Rectal bleed    Renal calculi    bilateral--  left > right   Ulcerative colitis followed by dr Laural Golden   Distal UC dx 2004   Urgency of urination     Past Surgical History:  Procedure Laterality Date   APPLICATION OF WOUND VAC N/A 03/26/2012   Procedure: APPLICATION OF WOUND VAC;  Surgeon: Edward Jolly, MD;  Location: [place];  Service: General;  Laterality: N/A;   BALLOON DILATION N/A 01/15/2018   Procedure: [place];  Surgeon: Rogene Houston, MD;  [facility];  Service: Endoscopy;  Laterality: N/A;  ileorectum   BIOPSY  01/19/2017   Procedure: BIOPSY;  Surgeon: Rogene Houston, MD;  [facility];  Service: Endoscopy;;  rectal   CECOSTOMY  02/27/2012   Procedure: CECOSTOMY;  Surgeon: Donato Heinz, MD;  [facility];  Service: General;  Laterality: N/A;  Cecostomy Tube Placement   COLECTOMY  02/28/2012   Procedure: TOTAL COLECTOMY;  Surgeon: Donato Heinz, MD;  [facility];  Service: General;  Laterality: N/A;   COLONOSCOPY  02/16/2011   Procedure: COLONOSCOPY;  Surgeon:  Rogene Houston, MD;  [facility];  Service: Endoscopy;  Laterality: N/A;  100   CYSTOSCOPY W/ URETERAL STENT PLACEMENT Bilateral 03/20/2012   Procedure: CYSTOSCOPY WITH RETROGRADE PYELOGRAM/;  Surgeon: Alexis Frock, MD;  Location: [place];  Service: Urology;  Laterality: Bilateral. Pt reports no stents were placed.   CYSTOSCOPY WITH RETROGRADE PYELOGRAM, URETEROSCOPY AND STENT PLACEMENT Left 07/16/2014   Procedure: CYSTOSCOPY WITH RETROGRADE PYELOGRAM,  AND LEFT STENT PLACEMENT;  Surgeon: Alexis Frock, MD;  [facility];  Service: Urology;  Laterality: Left;   CYSTOSCOPY  WITH RETROGRADE PYELOGRAM, URETEROSCOPY AND STENT PLACEMENT Bilateral 07/25/2014   Procedure: CYSTOSCOPY WITH RETROGRADE PYELOGRAM, URETEROSCOPY AND BILATERAL STENT PLACEMENT;  Surgeon: Alexis Frock, MD;  [facility];  Service: Urology;  Laterality: Bilateral;   ESOPHAGOGASTRODUODENOSCOPY N/A 01/14/2016   Procedure: ESOPHAGOGASTRODUODENOSCOPY (EGD);  Surgeon: Rogene Houston, MD;  [facility];  Service: Endoscopy;  Laterality: N/A;   FLEXIBLE SIGMOIDOSCOPY  02/27/2012   Procedure: FLEXIBLE SIGMOIDOSCOPY;  Surgeon: Rogene Houston, MD;  [facility];  Service: Endoscopy;  Laterality: N/A;  with colonic decompression   FLEXIBLE SIGMOIDOSCOPY N/A 02/06/2013   Procedure: FLEXIBLE SIGMOIDOSCOPY;  Surgeon: Rogene Houston, MD;  [facility];  Service: Endoscopy;  Laterality: N/A;  100-moved to 1200 Ann notified pt   FLEXIBLE SIGMOIDOSCOPY N/A 03/27/2013   Procedure: FLEXIBLE SIGMOIDOSCOPY;  Surgeon: Rogene Houston, MD;  [facility];  Service: Endoscopy;  Laterality: N/A;  [place] N/A 12/11/2013   Procedure: FLEXIBLE SIGMOIDOSCOPY;  Surgeon: Rogene Houston, MD;  [facility];  Service: Endoscopy;  Laterality: N/A;  200   FLEXIBLE SIGMOIDOSCOPY N/A 01/07/2015   Procedure: FLEXIBLE SIGMOIDOSCOPY;  Surgeon: Rogene Houston, MD;  [facility];  Service: Endoscopy;  Laterality: N/A;  [place] N/A 01/14/2016   Procedure: FLEXIBLE SIGMOIDOSCOPY;  Surgeon: Rogene Houston, MD;  [facility];  Service: Endoscopy;  Laterality: N/A;   FLEXIBLE SIGMOIDOSCOPY N/A 01/19/2017   Procedure: FLEXIBLE SIGMOIDOSCOPY;  Surgeon: Rogene Houston, MD;  [facility];  Service: Endoscopy;  Laterality: N/A;  [place] N/A 01/15/2018   Procedure: FLEXIBLE SIGMOIDOSCOPY;  Surgeon: Rogene Houston, MD;  [facility];  Service: Endoscopy;   Laterality: N/A;  9:30   HOLMIUM LASER APPLICATION Left 02/01/1658   Procedure: HOLMIUM LASER APPLICATION, LEFT ONLY ;  Surgeon: Alexis Frock, MD;  Location: [facility];  Service: Urology;  Laterality: Left;   ILEOSTOMY  02/28/2012   Procedure: ILEOSTOMY;  Surgeon: Donato Heinz, MD;  [facility];  Service: General;  Laterality: N/A;   ILEOSTOMY CLOSURE N/A 05/27/2013   Procedure: ILEOSTOMY TAKEDOWN WITH ILEOPROCTOSTOMY;  Surgeon: Edward Jolly, MD;  [facility];  Service: General;  Laterality: N/A;   LAPAROTOMY  02/27/2012   Procedure: EXPLORATORY LAPAROTOMY;  Surgeon: Donato Heinz, MD;  [facility];  Service: General;  Laterality: N/A;   LAPAROTOMY N/A 03/26/2012   Procedure: EXPLORATORY LAPAROTOMY;  Surgeon: Edward Jolly, MD;  Location: [place];  Service: General;  Laterality: N/A;   LAPAROTOMY N/A 03/29/2012   Procedure: EXPLORATORY LAPAROTOMY, PARTIAL WOUND CLOSURE;  Surgeon: Edward Jolly, MD;  Location: [place];  Service: General;  Laterality: N/A;   LIVER BIOPSY N/A 03/26/2012   Procedure: LIVER BIOPSY;  Surgeon: Edward Jolly, MD;  Location: [place];  Service: General;  Laterality: N/A;   SIGMOID COLECTOMY /  LOW ANTERIOR RESECTION/  END SIGMOID COLOSTOMY  11-07-2010   [facility]   Sigmoid colonic perferation---  TAKE DOWN COLOSTOMY,  Jan 2013   TRANSTHORACIC ECHOCARDIOGRAM  03-26-2012  (in setting of septic shock)   report states  poorly visualized--  normal LVSF, ef 50-55% and no pericardial effusion   VACUUM ASSISTED CLOSURE CHANGE N/A 03/29/2012   Procedure: Open ABDOMINAL VACUUM  CHANGE;  Surgeon: Edward Jolly, MD;  Location: [place];  Service: General;  Laterality: N/A;   VACUUM ASSISTED CLOSURE CHANGE N/A 04/01/2012   Procedure: removal of abdominal vac dressing and abdominal closure;  Surgeon: Edward Jolly, MD;  Location: [place];  Service: General;  Laterality: N/A;    Family History  Problem Relation Age of  Onset   Cancer Sister    Healthy Daughter    Hypertension Mother    Colon cancer Neg Hx    Social History:  reports that he quit smoking about 35 years ago. His smoking use included cigarettes. He has a 6.00 pack-year smoking history. He has never used smokeless tobacco. He reports that he does not drink alcohol and does not use drugs.  Allergies:  Allergies  Allergen Reactions   Penicillins Other (See Comments)    "Heart rate slowed down"  bradycardia DID THE REACTION INVOLVE: Swelling of the face/tongue/throat, SOB, or low BP? Yes Sudden or severe rash/hives, skin peeling, or the inside of the mouth or nose? No Did it require medical treatment? Yes When did it last happen?years  If all above answers are NO, may proceed with cephalosporin use.     Clindamycin/Lincomycin Diarrhea    C-diff   Morphine And Related Nausea And Vomiting   Claritin [Loratadine] Rash   Contrast Media [Iodinated Diagnostic Agents] Hives    Patient reports hives after IV contrast.    Primaxin [Imipenem] Rash    Medications Prior to Admission  Medication Sig Dispense Refill   acetaminophen (TYLENOL) 500 MG tablet Take 1,000 mg by mouth every 6 (six) hours as needed for moderate pain or headache.      aspirin EC 81 MG tablet Take 1 tablet (81 mg total) by mouth daily.     diphenhydrAMINE (BENADRYL) 25 MG tablet Take 25 mg by mouth daily as needed (prior to remicade infusion). Take before remicade infusion      ferrous sulfate 325 (65 FE) MG tablet Take 325 mg by mouth 2 (two) times daily with a meal.     mesalamine (CANASA) 1000 MG suppository UNWRAP AND INSERT 1  SUPPOSITORY RECTALLY AT  BEDTIME (Patient taking differently: Place 1,000 mg rectally at bedtime. ) 90 suppository 3   Multiple Minerals-Vitamins (CITRACAL PLUS PO) Take 1 tablet by mouth 2 (two) times daily.     Multiple Vitamin (MULTIVITAMIN WITH MINERALS) TABS tablet Take 1 tablet by mouth daily.     vitamin C  (ASCORBIC ACID) 500 MG tablet Take 500 mg by mouth daily.     diphenoxylate-atropine (LOMOTIL) 2.5-0.025 MG tablet Take 1 tablet by mouth 4 (four) times daily. (Patient not taking: Reported on 06/25/2019) 120 tablet 5   inFLIXimab (REMICADE) 100 MG injection Inject 5 mg into the vein every 8 (eight) weeks. Patient is receiving 5 mg /kg IV every 8 weeks.     saccharomyces boulardii (FLORASTOR) 250 MG capsule Take 1 capsule (250 mg total) by mouth daily. (Patient not taking: Reported on 09/27/2019) 90 capsule 3    Results for orders  placed or performed during the hospital encounter of 10/02/19 (from the past 48 hour(s))  SARS CORONAVIRUS 2 (TAT 6-24 HRS) Nasopharyngeal Nasopharyngeal Swab     Status: None   Collection Time: 10/02/19  3:15 PM   Specimen: Nasopharyngeal Swab  Result Value Ref Range   SARS Coronavirus 2 NEGATIVE NEGATIVE    Comment: (NOTE) SARS-CoV-2 target nucleic acids are NOT DETECTED.  The SARS-CoV-2 RNA is generally detectable in upper and lower respiratory specimens during the acute phase of infection. Negative results do not preclude SARS-CoV-2 infection, do not rule out co-infections with other pathogens, and should not be used as the sole basis for treatment or other patient management decisions. Negative results must be combined with clinical observations, patient history, and epidemiological information. The expected result is Negative.  Fact Sheet for Patients: SugarRoll.be  Fact Sheet for Healthcare Providers: https://www.woods-mathews.com/  This test is not yet approved or cleared by the [place] FDA and  has been authorized for detection and/or diagnosis of SARS-CoV-2 by FDA under an Emergency Use Authorization (EUA). This EUA will remain  in effect (meaning this test can be used) for the duration of the COVID-19 declaration under Se ction 564(b)(1) of the Act, 21 U.S.C. section 360bbb-3(b)(1), unless the  authorization is terminated or revoked sooner.  Performed at [facility], [place] [address], [place], [place] [address]    No results found.  Review of Systems  Blood pressure 125/75, pulse (!) 56, temperature 97.7 F (36.5 C), temperature source Oral, resp. rate 13, height 5' 9"  (1.753 m), SpO2 100 %. Physical Exam HENT:     Mouth/Throat:     Mouth: Mucous membranes are moist.     Pharynx: Oropharynx is clear.  Eyes:     General: No scleral icterus.    Conjunctiva/sclera: Conjunctivae normal.  Cardiovascular:     Rate and Rhythm: Normal rate and regular rhythm.     Heart sounds: Normal heart sounds. No murmur heard.   Pulmonary:     Effort: Pulmonary effort is normal.     Breath sounds: Normal breath sounds.  Abdominal:     Comments: He has lower midline and scar in right as well as left iliac fossa.  He has incisional hernia which is large but reducible.  On palpation abdomen is soft and nontender with no organomegaly or masses.  Musculoskeletal:        General: No swelling.     Cervical back: Neck supple.  Lymphadenopathy:     Cervical: No cervical adenopathy.  Skin:    General: Skin is warm and dry.  Neurological:     Mental Status: He is alert.      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 Diagnostic flexible sigmoidoscopy.  Hildred Laser, MD 10/04/2019, 7:40 AM

## 2019-10-04 NOTE — Op Note (Addendum)
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 Patsy Lager,                            RN, Burke Keels, Technician Referring MD:             Eartha Inch, MD Medicines:                Meperidine 50 mg IV, Midazolam 8 mg IV Complications:            No immediate complications. Estimated Blood Loss:     Estimated blood loss was minimal. Estimated blood                            loss: 30 mL requiring treatment with hemo-spray. Procedure:                Pre-Anesthesia Assessment:                           - Prior to the procedure, a History and Physical                            was performed, and patient medications and                            allergies were reviewed. The patient's tolerance of                            previous anesthesia was also reviewed. The risks                            and benefits of the procedure and the sedation                            options and risks were discussed with the patient.                            All questions were answered, and informed consent                            was obtained. Prior Anticoagulants: The patient has                            taken no previous anticoagulant or antiplatelet  agents except for aspirin. ASA Grade Assessment:                            III - A patient with severe systemic disease. After                            reviewing the risks and benefits, the patient was                            deemed in satisfactory condition to undergo the                             procedure.                           After obtaining informed consent, the scope was                            passed under direct vision. The [id number]                            ([id number]) was introduced through the anus and                            advanced to the the ileo-rectal anastomosis. The                            flexible sigmoidoscopy was accomplished without                            difficulty. The patient tolerated the procedure                            well. The quality of the bowel preparation was                            excellent. Scope In: 7:52:06 AM Scope Out: 8:47:41 AM Total Procedure Duration: 0 hours 55 minutes 35 seconds  Findings:      The digital rectal exam findings include firm polyp along anterior       rectal wall.      A single suture material noted close to anastamosis. .      A benign-appearing, intrinsic moderate stenosis measuring less than one       cm (in length) x 1 cm (inner diameter) was found at the anastomosis and       was traversed.      The neo-terminal ileum appeared normal.      Inflammationof distal rectal pouch. This was graded as Mayo Score 2       (moderate, with marked erythema, absent vascular pattern, friability,       erosions), and when compared to the previous examination, the findings       are improved.      A 12 mm polyp was found in the distal rectum. The polyp was ulcerated       and semi-pedunculated. The polyp was removed with a hot snare.  Polyp       resection was incomplete. The resected tissue was retrieved. To stop       active bleeding, hemoclip application attempted but unsuccesful. To stop       active bleeding, hemostatic spray was deployed. Two sprays were applied.       Bleeding had stopped at the end of the procedure. Impression:               - Firm polyp along anterior rectal wall. found on                            digital rectal exam.                           - Suture material noted close to  anastamosis.                           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                           - The neo-terminal ileum normal.                           - Moderately active (Mayo Score 2) proctitis                            ulcerative colitis, improved since the last                            examination.                           - One 12 mm polyp in the distal rectum, removed                            with a hot snare. Incomplete resection. Resected                            tissue retrieved. Clip (MR conditional) placement                            not succesful. Hemostatic spray applied.                           comment: small amount of polyp noted at polypectomy                            site. Moderate Sedation:      Moderate (conscious) sedation was administered by the endoscopy nurse       and supervised by the endoscopist. The following parameters were       monitored: oxygen saturation, heart rate, blood pressure, CO2       capnography and response to care. Total physician intraservice time was       60 minutes. Recommendation:           - Patient has a contact number available for  emergencies. The signs and symptoms of potential                            delayed complications were discussed with the                            patient. Return to normal activities tomorrow.                            Written discharge instructions were provided to the                            patient.                           - Resume previous diet today.                           - Continue present medications.                           - Wait for path results. Procedure Code(s):        --- Professional ---                           [phone], Sigmoidoscopy, flexible; with removal of                            tumor(s), polyp(s), or other lesion(s) by snare                            technique                           99153, Moderate sedation;  each additional 15                            minutes intraservice time                           99153, Moderate sedation; each additional 15                            minutes intraservice time                           99153, Moderate sedation; each additional 15                            minutes intraservice time                           G0500, Moderate sedation services provided by the                            same physician or other qualified health care  professional performing a gastrointestinal                            endoscopic service that sedation supports,                            requiring the presence of an independent trained                            observer to assist in the monitoring of the                            patient's level of consciousness and physiological                            status; initial 15 minutes of intra-service time;                            patient age 44 years or older (additional time may                            be reported with 52841, as appropriate) Diagnosis Code(s):        --- Professional ---                           L24.401, Ulcerative (chronic) proctitis with                            intestinal obstruction                           T81.89XA, Other complications of procedures, not                            elsewhere classified, initial encounter                           K62.1, Rectal polyp                           K92.1, Melena (includes Hematochezia) CPT copyright 2019 American Medical Association. All rights reserved. The codes documented in this report are preliminary and upon coder review may  be revised to meet current compliance requirements. Lionel December, MD Lionel December, MD 10/04/2019 9:15:50 AM This report has been signed electronically. Number of Addenda: 0

## 2019-10-07 ENCOUNTER — Other Ambulatory Visit (INDEPENDENT_AMBULATORY_CARE_PROVIDER_SITE_OTHER): Payer: Self-pay | Admitting: Internal Medicine

## 2019-10-07 DIAGNOSIS — K51219 Ulcerative (chronic) proctitis with unspecified complications: Secondary | ICD-10-CM

## 2019-10-07 LAB — SURGICAL PATHOLOGY

## 2019-10-08 ENCOUNTER — Encounter (HOSPITAL_COMMUNITY): Payer: Self-pay | Admitting: Internal Medicine

## 2019-10-21 LAB — CBC WITH DIFFERENTIAL/PLATELET
Absolute Monocytes: 621 cells/uL (ref 200–950)
Basophils Absolute: 29 cells/uL (ref 0–200)
Basophils Relative: 0.5 %
Eosinophils Absolute: 128 cells/uL (ref 15–500)
Eosinophils Relative: 2.2 %
HCT: 39.5 % (ref 38.5–50.0)
Hemoglobin: 13.3 g/dL (ref 13.2–17.1)
Lymphs Abs: 1589 cells/uL (ref 850–3900)
MCH: 32.8 pg (ref 27.0–33.0)
MCHC: 33.7 g/dL (ref 32.0–36.0)
MCV: 97.3 fL (ref 80.0–100.0)
MPV: 10 fL (ref 7.5–12.5)
Monocytes Relative: 10.7 %
Neutro Abs: 3434 cells/uL (ref 1500–7800)
Neutrophils Relative %: 59.2 %
Platelets: 192 10*3/uL (ref 140–400)
RBC: 4.06 10*6/uL — ABNORMAL LOW (ref 4.20–5.80)
RDW: 12.6 % (ref 11.0–15.0)
Total Lymphocyte: 27.4 %
WBC: 5.8 10*3/uL (ref 3.8–10.8)

## 2019-10-21 LAB — THIOPURINE METHYLTRANSFERASE (TPMT), RBC: Thiopurine Methyltransferase, RBC: 15 nmol/hr/mL RBC

## 2019-10-22 ENCOUNTER — Other Ambulatory Visit (INDEPENDENT_AMBULATORY_CARE_PROVIDER_SITE_OTHER): Payer: Self-pay | Admitting: Internal Medicine

## 2019-10-22 DIAGNOSIS — K51219 Ulcerative (chronic) proctitis with unspecified complications: Secondary | ICD-10-CM

## 2019-10-22 MED ORDER — MERCAPTOPURINE 50 MG PO TABS: 50.0000 mg | ORAL_TABLET | Freq: Every day | ORAL | 5 refills | Status: DC

## 2019-10-23 ENCOUNTER — Other Ambulatory Visit (INDEPENDENT_AMBULATORY_CARE_PROVIDER_SITE_OTHER): Payer: Self-pay | Admitting: *Deleted

## 2019-10-23 DIAGNOSIS — K51219 Ulcerative (chronic) proctitis with unspecified complications: Secondary | ICD-10-CM

## 2019-10-23 NOTE — Progress Notes (Signed)
Test found

## 2019-11-29 LAB — CBC WITH DIFFERENTIAL/PLATELET
Absolute Monocytes: 595 cells/uL (ref 200–950)
Basophils Absolute: 70 cells/uL (ref 0–200)
Basophils Relative: 1.1 %
Eosinophils Absolute: 179 cells/uL (ref 15–500)
Eosinophils Relative: 2.8 %
HCT: 39.2 % (ref 38.5–50.0)
Hemoglobin: 13.3 g/dL (ref 13.2–17.1)
Lymphs Abs: 1696 cells/uL (ref 850–3900)
MCH: 33 pg (ref 27.0–33.0)
MCHC: 33.9 g/dL (ref 32.0–36.0)
MCV: 97.3 fL (ref 80.0–100.0)
MPV: 9.4 fL (ref 7.5–12.5)
Monocytes Relative: 9.3 %
Neutro Abs: 3859 cells/uL (ref 1500–7800)
Neutrophils Relative %: 60.3 %
Platelets: 231 10*3/uL (ref 140–400)
RBC: 4.03 10*6/uL — ABNORMAL LOW (ref 4.20–5.80)
RDW: 12.4 % (ref 11.0–15.0)
Total Lymphocyte: 26.5 %
WBC: 6.4 10*3/uL (ref 3.8–10.8)

## 2019-11-29 LAB — COMPREHENSIVE METABOLIC PANEL
AG Ratio: 1.5 (calc) (ref 1.0–2.5)
ALT: 21 U/L (ref 9–46)
AST: 25 U/L (ref 10–35)
Albumin: 3.8 g/dL (ref 3.6–5.1)
Alkaline phosphatase (APISO): 70 U/L (ref 35–144)
BUN/Creatinine Ratio: 20 (calc) (ref 6–22)
BUN: 25 mg/dL (ref 7–25)
CO2: 29 mmol/L (ref 20–32)
Calcium: 8.9 mg/dL (ref 8.6–10.3)
Chloride: 105 mmol/L (ref 98–110)
Creat: 1.27 mg/dL — ABNORMAL HIGH (ref 0.70–1.25)
Globulin: 2.6 g/dL (calc) (ref 1.9–3.7)
Glucose, Bld: 89 mg/dL (ref 65–139)
Potassium: 4.3 mmol/L (ref 3.5–5.3)
Sodium: 140 mmol/L (ref 135–146)
Total Bilirubin: 0.5 mg/dL (ref 0.2–1.2)
Total Protein: 6.4 g/dL (ref 6.1–8.1)

## 2019-11-29 LAB — THIOPURINE METABOLITES

## 2019-12-02 ENCOUNTER — Other Ambulatory Visit (INDEPENDENT_AMBULATORY_CARE_PROVIDER_SITE_OTHER): Payer: Self-pay

## 2019-12-02 DIAGNOSIS — K51219 Ulcerative (chronic) proctitis with unspecified complications: Secondary | ICD-10-CM

## 2019-12-07 ENCOUNTER — Other Ambulatory Visit (INDEPENDENT_AMBULATORY_CARE_PROVIDER_SITE_OTHER): Payer: Self-pay | Admitting: Internal Medicine

## 2019-12-10 LAB — THIOPURINE METABOLITES
6 MMP(6-Methylmercaptopurine): <500 pmol/8x10(8)RBC (ref <5700)
6 TG(6-Thioguanine): 448 pmol/8x10(8)RBC — ABNORMAL HIGH (ref 235–400)

## 2019-12-12 ENCOUNTER — Other Ambulatory Visit (INDEPENDENT_AMBULATORY_CARE_PROVIDER_SITE_OTHER): Payer: Self-pay | Admitting: *Deleted

## 2019-12-12 DIAGNOSIS — K625 Hemorrhage of anus and rectum: Secondary | ICD-10-CM

## 2019-12-12 DIAGNOSIS — K51219 Ulcerative (chronic) proctitis with unspecified complications: Secondary | ICD-10-CM

## 2019-12-16 ENCOUNTER — Other Ambulatory Visit (INDEPENDENT_AMBULATORY_CARE_PROVIDER_SITE_OTHER): Payer: Self-pay | Admitting: *Deleted

## 2019-12-16 DIAGNOSIS — K51219 Ulcerative (chronic) proctitis with unspecified complications: Secondary | ICD-10-CM

## 2019-12-16 DIAGNOSIS — K625 Hemorrhage of anus and rectum: Secondary | ICD-10-CM

## 2020-01-07 ENCOUNTER — Encounter (INDEPENDENT_AMBULATORY_CARE_PROVIDER_SITE_OTHER): Payer: Self-pay | Admitting: Internal Medicine

## 2020-01-07 ENCOUNTER — Other Ambulatory Visit: Payer: Self-pay

## 2020-01-07 ENCOUNTER — Ambulatory Visit (INDEPENDENT_AMBULATORY_CARE_PROVIDER_SITE_OTHER): Payer: BC Managed Care – PPO | Admitting: Internal Medicine

## 2020-01-07 VITALS — BP 127/67 | HR 56 | Temp 98.6°F | Ht 70.0 in | Wt 179.1 lb

## 2020-01-07 DIAGNOSIS — K51219 Ulcerative (chronic) proctitis with unspecified complications: Secondary | ICD-10-CM

## 2020-01-07 NOTE — Progress Notes (Signed)
Presenting complaint;  Follow-up for ulcerative proctitis.  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  About 10 weeks ago he was begun on low-dose 6-MP.  Metabolites were checked.  6-TG level was mildly elevated but 6- MMP was low.  It was decided to recheck lab in few weeks rather than changing the dose. Patient had blood work done prior to this visit.  These levels are still pending.  He had flexible sigmoidoscopy and some repair 2021 revealing ulcerative proctitis ulcerated polyp at distal rectum was snared.  He also had ileorectal anastomotic stricture.  Patient states he feels better.  He feels his proctitis is healing.  He has noted decrease in the frequency of hematochezia.  He may have one or two episode every 2 weeks in the form of blood in the tissue.  He has noted decrease in the amount of blood that he is seeing.  He is having 5-7 stools per day.  Most of his stools are soft and are loose and he may have formed stool occasionally.  He is having nocturnal bowel movement at least three times every week.  His appetite is good.  He has gained 8 pounds since his last visit of June 2021.  He denies nausea vomiting abdominal pain skin rash fever or chills. He is not having any side effects with this medication.  Current Medications: Outpatient Encounter Medications as of 01/07/2020  Medication Sig  . acetaminophen (TYLENOL) 500 MG tablet Take 1,000 mg by mouth every 6 (six) hours as needed for moderate pain or headache.   Marland Kitchen aspirin EC 81 MG tablet Take 1 tablet (81 mg total) by mouth daily.  . diphenhydrAMINE (BENADRYL) 25 MG tablet Take 25 mg by mouth daily as needed (prior to remicade infusion). Take before remicade infusion  . ferrous sulfate 325 (65 FE) MG tablet Take 325 mg by  mouth 2 (two) times daily with a meal.  . inFLIXimab (REMICADE) 100 MG injection Inject 5 mg into the vein every 8 (eight) weeks. Patient is receiving 5 mg /kg IV every 8 weeks.  . mercaptopurine (PURINETHOL) 50 MG tablet Take 1 tablet (50 mg total) by mouth daily. Give on an empty stomach 1 hour before or 2 hours after meals. Caution: Chemotherapy.  . mesalamine (CANASA) 1000 MG suppository UNWRAP AND INSERT 1  SUPPOSITORY RECTALLY AT  BEDTIME  . Multiple Minerals-Vitamins (CITRACAL PLUS PO) Take 1 tablet by mouth 2 (two) times daily.  . Multiple Vitamin (MULTIVITAMIN WITH MINERALS) TABS tablet Take 1 tablet by mouth daily.  . vitamin C (ASCORBIC ACID) 500 MG tablet Take 500 mg by mouth daily.   No facility-administered encounter medications on file as of 01/07/2020.     Objective: Blood pressure 127/67, pulse (!) 56, temperature 98.6 F (37 C), temperature source Oral, height 5' 10"  (1.778 m), weight 179 lb 1.6 oz (81.2 kg). Patient is alert and in no acute distress. He is wearing a mask. Conjunctiva is pink. Sclera is nonicteric Oropharyngeal mucosa is normal. No neck masses or thyromegaly noted. Cardiac exam with regular rhythm normal S1 and S2. No murmur or gallop noted. Lungs are clear to auscultation. Abdomen is full.  He has lower midline scar along with small scar in right lower quadrant.  He has large incisional hernia which is completely reducible.  On palpation  abdomen is soft and nontender with organomegaly or masses. No LE edema or clubbing noted.  Labs/studies Results:   CBC Latest Ref Rng & Units 01/06/2020 11/21/2019 10/14/2019  WBC 3.8 - 10.8 Thousand/uL 4.3 6.4 5.8  Hemoglobin 13.2 - 17.1 g/dL 13.4 13.3 13.3  Hematocrit 38.5 - 50.0 % 38.2(L) 39.2 39.5  Platelets 140 - 400 Thousand/uL 183 231 192    CMP Latest Ref Rng & Units 11/21/2019 08/29/2014 08/28/2014  Glucose 65 - 139 mg/dL 89 103(H) 94  BUN 7 - 25 mg/dL 25 22(H) 19  Creatinine 0.70 - 1.25 mg/dL 1.27(H)  1.44(H) 1.31(H)  Sodium 135 - 146 mmol/L 140 138 135  Potassium 3.5 - 5.3 mmol/L 4.3 4.4 4.3  Chloride 98 - 110 mmol/L 105 107 104  CO2 20 - 32 mmol/L 29 23 24   Calcium 8.6 - 10.3 mg/dL 8.9 8.4(L) 8.0(L)  Total Protein 6.1 - 8.1 g/dL 6.4 - -  Total Bilirubin 0.2 - 1.2 mg/dL 0.5 - -  Alkaline Phos 38 - 126 U/L - - -  AST 10 - 35 U/L 25 - -  ALT 9 - 46 U/L 21 - -    Hepatic Function Latest Ref Rng & Units 11/21/2019 08/27/2014 08/26/2014  Total Protein 6.1 - 8.1 g/dL 6.4 6.4(L) 7.5  Albumin 3.5 - 5.0 g/dL - 2.8(L) 3.4(L)  AST 10 - 35 U/L 25 13(L) 17  ALT 9 - 46 U/L 21 11(L) 13(L)  Alk Phosphatase 38 - 126 U/L - 59 78  Total Bilirubin 0.2 - 1.2 mg/dL 0.5 0.7 0.7  Bilirubin, Direct 0.0 - 0.3 mg/dL - - -    Lab Results  Component Value Date   CRP 2.6 10/27/2016    Thio purine metabolite level pending.  Assessment:  #1.  Ulcerative proctitis.  Rest of his colon has been removed.  His disease has not been controlled with infliximab infusion, oral and topical mesalamine.  He was therefore begun on low-dose 6-MP by 10 weeks ago.  Symptomatically he is doing better.  Hopefully endoscopic remission will be documented at the time of next flexible sigmoidoscopy 4 to 6 months. It remains to be seen if 6- TG levels are elevated in which case dose will have to be reduced.   Plan:  Patient will be contacted when results of pending blood work available. CBC with differential and comprehensive chemistry panel in 3 months. Office visit in 6 months.

## 2020-01-07 NOTE — Patient Instructions (Signed)
Physician will call with results of pending blood work. Next blood work in 3 months.

## 2020-01-11 LAB — CBC WITH DIFFERENTIAL/PLATELET
Absolute Monocytes: 460 cells/uL (ref 200–950)
Basophils Absolute: 39 cells/uL (ref 0–200)
Basophils Relative: 0.9 %
Eosinophils Absolute: 142 cells/uL (ref 15–500)
Eosinophils Relative: 3.3 %
HCT: 38.2 % — ABNORMAL LOW (ref 38.5–50.0)
Hemoglobin: 13.4 g/dL (ref 13.2–17.1)
Lymphs Abs: 1286 cells/uL (ref 850–3900)
MCH: 34.4 pg — ABNORMAL HIGH (ref 27.0–33.0)
MCHC: 35.1 g/dL (ref 32.0–36.0)
MCV: 97.9 fL (ref 80.0–100.0)
MPV: 9.7 fL (ref 7.5–12.5)
Monocytes Relative: 10.7 %
Neutro Abs: 2374 cells/uL (ref 1500–7800)
Neutrophils Relative %: 55.2 %
Platelets: 183 10*3/uL (ref 140–400)
RBC: 3.9 10*6/uL — ABNORMAL LOW (ref 4.20–5.80)
RDW: 13.9 % (ref 11.0–15.0)
Total Lymphocyte: 29.9 %
WBC: 4.3 10*3/uL (ref 3.8–10.8)

## 2020-01-11 LAB — THIOPURINE METABOLITES
6 MMP(6-Methylmercaptopurine): <500 pmol/8x10(8)RBC (ref <5700)
6 TG(6-Thioguanine): 383 pmol/8x10(8)RBC (ref 235–400)

## 2020-01-23 ENCOUNTER — Encounter (INDEPENDENT_AMBULATORY_CARE_PROVIDER_SITE_OTHER): Payer: Self-pay

## 2020-03-12 ENCOUNTER — Other Ambulatory Visit (INDEPENDENT_AMBULATORY_CARE_PROVIDER_SITE_OTHER): Payer: Self-pay | Admitting: *Deleted

## 2020-03-12 DIAGNOSIS — K51219 Ulcerative (chronic) proctitis with unspecified complications: Secondary | ICD-10-CM

## 2020-04-07 ENCOUNTER — Other Ambulatory Visit (INDEPENDENT_AMBULATORY_CARE_PROVIDER_SITE_OTHER): Payer: Self-pay | Admitting: Internal Medicine

## 2020-04-07 LAB — CBC WITH DIFFERENTIAL/PLATELET
Absolute Monocytes: 538 cells/uL (ref 200–950)
Basophils Absolute: 29 cells/uL (ref 0–200)
Basophils Relative: 0.6 %
Eosinophils Absolute: 101 cells/uL (ref 15–500)
Eosinophils Relative: 2.1 %
HCT: 36.1 % — ABNORMAL LOW (ref 38.5–50.0)
Hemoglobin: 12.5 g/dL — ABNORMAL LOW (ref 13.2–17.1)
Lymphs Abs: 1330 cells/uL (ref 850–3900)
MCH: 35.7 pg — ABNORMAL HIGH (ref 27.0–33.0)
MCHC: 34.6 g/dL (ref 32.0–36.0)
MCV: 103.1 fL — ABNORMAL HIGH (ref 80.0–100.0)
MPV: 9.6 fL (ref 7.5–12.5)
Monocytes Relative: 11.2 %
Neutro Abs: 2803 cells/uL (ref 1500–7800)
Neutrophils Relative %: 58.4 %
Platelets: 171 10*3/uL (ref 140–400)
RBC: 3.5 10*6/uL — ABNORMAL LOW (ref 4.20–5.80)
RDW: 13.9 % (ref 11.0–15.0)
Total Lymphocyte: 27.7 %
WBC: 4.8 10*3/uL (ref 3.8–10.8)

## 2020-04-07 LAB — HEPATIC FUNCTION PANEL
AG Ratio: 1.4 (calc) (ref 1.0–2.5)
ALT: 11 U/L (ref 9–46)
AST: 16 U/L (ref 10–35)
Albumin: 3.6 g/dL (ref 3.6–5.1)
Alkaline phosphatase (APISO): 71 U/L (ref 35–144)
Bilirubin, Direct: 0.1 mg/dL (ref 0.0–0.2)
Globulin: 2.5 g/dL (calc) (ref 1.9–3.7)
Indirect Bilirubin: 0.3 mg/dL (calc) (ref 0.2–1.2)
Total Bilirubin: 0.4 mg/dL (ref 0.2–1.2)
Total Protein: 6.1 g/dL (ref 6.1–8.1)

## 2020-04-07 MED ORDER — MERCAPTOPURINE 50 MG PO TABS
50.0000 mg | ORAL_TABLET | Freq: Every day | ORAL | 5 refills | Status: DC
Start: 2020-04-07 — End: 2020-10-19

## 2020-04-08 ENCOUNTER — Other Ambulatory Visit (INDEPENDENT_AMBULATORY_CARE_PROVIDER_SITE_OTHER): Payer: Self-pay | Admitting: *Deleted

## 2020-04-08 DIAGNOSIS — K625 Hemorrhage of anus and rectum: Secondary | ICD-10-CM

## 2020-04-08 DIAGNOSIS — K51219 Ulcerative (chronic) proctitis with unspecified complications: Secondary | ICD-10-CM

## 2020-04-08 DIAGNOSIS — K529 Noninfective gastroenteritis and colitis, unspecified: Secondary | ICD-10-CM

## 2020-05-11 LAB — VITAMIN B12: Vitamin B-12: 294 pg/mL (ref 200–1100)

## 2020-05-11 LAB — CBC WITH DIFFERENTIAL/PLATELET
Absolute Monocytes: 374 cells/uL (ref 200–950)
Basophils Absolute: 31 cells/uL (ref 0–200)
Basophils Relative: 0.7 %
Eosinophils Absolute: 79 cells/uL (ref 15–500)
Eosinophils Relative: 1.8 %
HCT: 39.4 % (ref 38.5–50.0)
Hemoglobin: 13.3 g/dL (ref 13.2–17.1)
Lymphs Abs: 1021 cells/uL (ref 850–3900)
MCH: 35.5 pg — ABNORMAL HIGH (ref 27.0–33.0)
MCHC: 33.8 g/dL (ref 32.0–36.0)
MCV: 105.1 fL — ABNORMAL HIGH (ref 80.0–100.0)
MPV: 9.3 fL (ref 7.5–12.5)
Monocytes Relative: 8.5 %
Neutro Abs: 2895 cells/uL (ref 1500–7800)
Neutrophils Relative %: 65.8 %
Platelets: 205 10*3/uL (ref 140–400)
RBC: 3.75 10*6/uL — ABNORMAL LOW (ref 4.20–5.80)
RDW: 13.6 % (ref 11.0–15.0)
Total Lymphocyte: 23.2 %
WBC: 4.4 10*3/uL (ref 3.8–10.8)

## 2020-05-11 LAB — QUANTIFERON-TB GOLD PLUS
Mitogen-NIL: 7.1 IU/mL
NIL: 0.05 IU/mL
QuantiFERON-TB Gold Plus: NEGATIVE
TB1-NIL: <0 IU/mL
TB2-NIL: <0 IU/mL

## 2020-07-07 ENCOUNTER — Other Ambulatory Visit: Payer: Self-pay

## 2020-07-07 ENCOUNTER — Encounter (INDEPENDENT_AMBULATORY_CARE_PROVIDER_SITE_OTHER): Payer: Self-pay | Admitting: Internal Medicine

## 2020-07-07 ENCOUNTER — Ambulatory Visit (INDEPENDENT_AMBULATORY_CARE_PROVIDER_SITE_OTHER): Payer: BC Managed Care – PPO | Admitting: Internal Medicine

## 2020-07-07 VITALS — BP 121/67 | HR 60 | Temp 98.9°F | Ht 70.0 in | Wt 177.0 lb

## 2020-07-07 DIAGNOSIS — K51219 Ulcerative (chronic) proctitis with unspecified complications: Secondary | ICD-10-CM | POA: Diagnosis not present

## 2020-07-07 NOTE — Progress Notes (Signed)
Presenting complaint;  Follow-up for ulcerative colitis/proctitis  Database and subjective:  Patient is 60 year old Caucasian male who has 18-year history of ulcerative colitis and had remained in remission on oral mesalamine. In October 2012 who presented with pneumoperitoneum and underwent laparotomy with sigmoid resection and colostomy at [facility].  Colostomy was subsequently reversed.  Pathology strangely enough did not reveal etiology of his perforation.  He was in endoscopic remission. His next big setback was in February 2014 when he developed fulminant C. difficile colitis after having been on clindamycin for few days.  He underwent emergency subtotal colectomy.  He had multiple postop complications but eventually recovered.  His ileostomy was taken down by Dr. Adonis Housekeeper at a later date. He developed active disease involving the rectum.  He has been maintained on infliximab.  He has improved symptomatically but is never an endoscopic remission.  He is also been treated with topical mesalamine.  He has developed noncritical ileorectal anastomotic stricture.  He had ulcerated polyp removed from distal rectum last year and returned to be inflammatory.  Since his disease was active low-dose 6-MP was added in September 2020.  TPMT activity was normal.  He feels well.  He has anywhere from 5-7 stools per day.  Stools are mushy.  He rarely has nocturnal bowel movement.  He has not had any accidents.  He is using Canasa suppository 6 days each week.  He has not noted blood recently.  He denies abdominal pain.  His appetite is good.  His weight has been stable since his last visit. He does not feel he is having any side effects with 6-MP.  Patient tells me that his brother died few weeks ago of metastatic colon carcinoma.  He was 61 at the time of diagnosis and died in less than a year.  He had never undergone CRC screening.  His family history is negative for colorectal  carcinoma.   Current Medications: Outpatient Encounter Medications as of 07/07/2020  Medication Sig   acetaminophen (TYLENOL) 500 MG tablet Take 1,000 mg by mouth every 6 (six) hours as needed for moderate pain or headache.    aspirin EC 81 MG tablet Take 1 tablet (81 mg total) by mouth daily.   diphenhydrAMINE (BENADRYL) 25 MG tablet Take 25 mg by mouth daily as needed (prior to remicade infusion). Take before remicade infusion   ferrous sulfate 325 (65 FE) MG tablet Take 325 mg by mouth daily with breakfast.   inFLIXimab (REMICADE) 100 MG injection Inject 5 mg into the vein every 8 (eight) weeks. Patient is receiving 5 mg /kg IV every 8 weeks.   mercaptopurine (PURINETHOL) 50 MG tablet Take 1 tablet (50 mg total) by mouth daily. Give on an empty stomach 1 hour before or 2 hours after meals. Caution: Chemotherapy.   mesalamine (CANASA) 1000 MG suppository UNWRAP AND INSERT 1  SUPPOSITORY RECTALLY AT  BEDTIME   Multiple Minerals-Vitamins (CITRACAL PLUS PO) Take 1 tablet by mouth 2 (two) times daily.   Multiple Vitamin (MULTIVITAMIN WITH MINERALS) TABS tablet Take 1 tablet by mouth daily.   vitamin C (ASCORBIC ACID) 500 MG tablet Take 500 mg by mouth daily.   No facility-administered encounter medications on file as of 07/07/2020.     Objective: Blood pressure 121/67, pulse 60, temperature 98.9 F (37.2 C), temperature source Oral, height 5' 10"  (1.778 m), weight 177 lb (80.3 kg). Patient is alert and in no acute distress. He is wearing a mask. Conjunctiva is pink. Sclera is  nonicteric Oropharyngeal mucosa is normal. No neck masses or thyromegaly noted. Cardiac exam with regular rhythm normal S1 and S2. No murmur or gallop noted. Lungs are clear to auscultation. Abdomen he has lower midline scar and fairly large incisional hernia which is easily reducible in supine position.  No organomegaly or masses. No LE edema or clubbing noted.  Labs/studies Results:   CBC Latest Ref Rng &  Units 05/08/2020 04/06/2020 01/06/2020  WBC 3.8 - 10.8 Thousand/uL 4.4 4.8 4.3  Hemoglobin 13.2 - 17.1 g/dL 13.3 12.5(L) 13.4  Hematocrit 38.5 - 50.0 % 39.4 36.1(L) 38.2(L)  Platelets 140 - 400 Thousand/uL 205 171 183    CMP Latest Ref Rng & Units 04/06/2020 11/21/2019 08/29/2014  Glucose 65 - 139 mg/dL - 89 103(H)  BUN 7 - 25 mg/dL - 25 22(H)  Creatinine 0.70 - 1.25 mg/dL - 1.27(H) 1.44(H)  Sodium 135 - 146 mmol/L - 140 138  Potassium 3.5 - 5.3 mmol/L - 4.3 4.4  Chloride 98 - 110 mmol/L - 105 107  CO2 20 - 32 mmol/L - 29 23  Calcium 8.6 - 10.3 mg/dL - 8.9 8.4(L)  Total Protein 6.1 - 8.1 g/dL 6.1 6.4 -  Total Bilirubin 0.2 - 1.2 mg/dL 0.4 0.5 -  Alkaline Phos 38 - 126 U/L - - -  AST 10 - 35 U/L 16 25 -  ALT 9 - 46 U/L 11 21 -    Hepatic Function Latest Ref Rng & Units 04/06/2020 11/21/2019 08/27/2014  Total Protein 6.1 - 8.1 g/dL 6.1 6.4 6.4(L)  Albumin 3.5 - 5.0 g/dL - - 2.8(L)  AST 10 - 35 U/L 16 25 13(L)  ALT 9 - 46 U/L 11 21 11(L)  Alk Phosphatase 38 - 126 U/L - - 59  Total Bilirubin 0.2 - 1.2 mg/dL 0.4 0.5 0.7  Bilirubin, Direct 0.0 - 0.2 mg/dL 0.1 - -    Lab Results  Component Value Date   CRP 2.6 10/27/2016      Assessment:  #1.  Ulcerative proctitis (patient lost most of his colon secondary to fulminant C. difficile colitis and not UC).   Patient's disease has not been controlled with the Remicade infusion along with the topical mesalamine.  Low-dose 6-MP was added in September last year after he had flexible sigmoidoscopy revealing active disease and ileal rectal anastomotic stricture.  Symptomatically he is doing better.  Hopefully this will translate into endoscopic remission.  We will plan flexible sigmoidoscopy in September this year.  #2.  History of anemia.  His H&H remains normal.   Plan:  Continue infliximab infusion every 8 weeks.  Doses 5 mg/kg. Continue 6-MP at a dose of 50 mg daily and Canasa suppositories as before. CBC with differential and comprehensive  chemistry panel and third week of July 2022. Flexible sigmoidoscopy in September 2022 to assess disease activity. Office visit in 1 year.

## 2020-07-07 NOTE — Patient Instructions (Signed)
Next blood work and third week of July 2022.  Office will call you. Flexible sigmoidoscopy in fourth week of September 2022 or first week of October 2022.

## 2020-08-04 ENCOUNTER — Other Ambulatory Visit (INDEPENDENT_AMBULATORY_CARE_PROVIDER_SITE_OTHER): Payer: Self-pay | Admitting: Internal Medicine

## 2020-08-04 ENCOUNTER — Encounter (INDEPENDENT_AMBULATORY_CARE_PROVIDER_SITE_OTHER): Payer: Self-pay

## 2020-08-04 LAB — CBC WITH DIFFERENTIAL/PLATELET
Absolute Monocytes: 482 cells/uL (ref 200–950)
Basophils Absolute: 32 cells/uL (ref 0–200)
Basophils Relative: 0.7 %
Eosinophils Absolute: 113 cells/uL (ref 15–500)
Eosinophils Relative: 2.5 %
HCT: 36.5 % — ABNORMAL LOW (ref 38.5–50.0)
Hemoglobin: 12.3 g/dL — ABNORMAL LOW (ref 13.2–17.1)
Lymphs Abs: 1386 cells/uL (ref 850–3900)
MCH: 35.3 pg — ABNORMAL HIGH (ref 27.0–33.0)
MCHC: 33.7 g/dL (ref 32.0–36.0)
MCV: 104.9 fL — ABNORMAL HIGH (ref 80.0–100.0)
MPV: 9.5 fL (ref 7.5–12.5)
Monocytes Relative: 10.7 %
Neutro Abs: 2489 cells/uL (ref 1500–7800)
Neutrophils Relative %: 55.3 %
Platelets: 153 10*3/uL (ref 140–400)
RBC: 3.48 10*6/uL — ABNORMAL LOW (ref 4.20–5.80)
RDW: 13.4 % (ref 11.0–15.0)
Total Lymphocyte: 30.8 %
WBC: 4.5 10*3/uL (ref 3.8–10.8)

## 2020-09-17 ENCOUNTER — Encounter (INDEPENDENT_AMBULATORY_CARE_PROVIDER_SITE_OTHER): Payer: Self-pay

## 2020-09-17 ENCOUNTER — Other Ambulatory Visit (INDEPENDENT_AMBULATORY_CARE_PROVIDER_SITE_OTHER): Payer: Self-pay

## 2020-09-17 DIAGNOSIS — K51219 Ulcerative (chronic) proctitis with unspecified complications: Secondary | ICD-10-CM

## 2020-09-21 NOTE — Patient Instructions (Signed)
Connor Small  09/21/2020     @PREFPERIOPPHARMACY @   Your procedure is scheduled on  09/30/2020.   Report to [facility] at  0830 A.M.   Call this number if you have problems the morning of surgery:  [phone]   Remember:  Follow the diet and prep instructions given to you by the office.    Take these medicines the morning of surgery with A SIP OF WATER                                         None     Do not wear jewelry, make-up or nail polish.  Do not wear lotions, powders, or perfumes, or deodorant.  Do not shave 48 hours prior to surgery.  Men may shave face and neck.  Do not bring valuables to the hospital.  [facility] is not responsible for any belongings or valuables.  Contacts, dentures or bridgework may not be worn into surgery.  Leave your suitcase in the car.  After surgery it may be brought to your room.  For patients admitted to the hospital, discharge time will be determined by your treatment team.  Patients discharged the day of surgery will not be allowed to drive home and must have someone with them for 24 hours.    Special instructions:   DO NOT smoek tobacco or vape for 24 hours before your procedure.  Please read over the following fact sheets that you were given. Anesthesia Post-op Instructions and Care and Recovery After Surgery      Flexible Sigmoidoscopy, Care After This sheet gives you information about how to care for yourself after your procedure. Your health care provider may also give you more specific instructions. If you have problems or questions, contact your health careprovider. What can I expect after the procedure? After the procedure, it is common to have: Cramping or pain in your abdomen. Bloating. A small amount of blood with your bowel movements. This may happen if a sample of tissue was removed for testing (biopsy). Follow these instructions at home: Eating and drinking  Drink enough fluid to keep your urine pale  yellow. Follow instructions from your health care provider about eating or drinking restrictions. Resume your normal diet as instructed by your health care provider. Avoid heavy or fried foods that are hard to digest.  Activity  If you were given a medicine to help you relax (sedative) during the procedure, it can affect you for several hours. Do not drive or operate machinery until your health care provider says that it is safe. Rest as told by your health care provider. Return to your normal activities as told by your health care provider. Ask your health care provider what activities are safe for you.  General instructions Take over-the-counter and prescription medicines only as told by your health care provider. Try walking around when you have cramps or feel bloated. Keep all follow-up visits as told by your health care provider. This is important. Contact a health care provider if: You have pain or cramping in your abdomen that gets worse or is not helped with medicine. You have a small amount of bleeding from your rectum that continues after 24 hours. You have nausea or vomiting. You feel weak or dizzy. You develop a fever. Get help right away if: You pass large blood clots or see  a large amount of blood in the toilet after having a bowel movement. You have severe pain in your abdomen. You have nausea or vomiting for more than 24 hours after the procedure. Summary After the procedure, you may have cramping or pain in your abdomen or you may have bloating. If you had a sample of tissue removed (biopsy), you may have a small amount of blood with your bowel movements. Resume your normal diet as instructed by your health care provider. Avoid heavy or fried foods that are hard to digest. Try walking around when you have cramps or feel bloated. Get help right away if you pass large blood clots or see a large amount of blood in the toilet after having a bowel movement. This information  is not intended to replace advice given to you by your health care provider. Make sure you discuss any questions you have with your healthcare provider. Document Revised: 01/07/2019 Document Reviewed: 01/07/2019 Elsevier Patient Education  2022 [employer] After This sheet gives you information about how to care for yourself after your procedure. Your health care provider may also give you more specific instructions. If you have problems or questions, contact your health careprovider. What can I expect after the procedure? After the procedure, it is common to have: Tiredness. Forgetfulness about what happened after the procedure. Impaired judgment for important decisions. Nausea or vomiting. Some difficulty with balance. Follow these instructions at home: For the time period you were told by your health care provider:     Rest as needed. Do not participate in activities where you could fall or become injured. Do not drive or use machinery. Do not drink alcohol. Do not take sleeping pills or medicines that cause drowsiness. Do not make important decisions or sign legal documents. Do not take care of children on your own. Eating and drinking Follow the diet that is recommended by your health care provider. Drink enough fluid to keep your urine pale yellow. If you vomit: Drink water, juice, or soup when you can drink without vomiting. Make sure you have little or no nausea before eating solid foods. General instructions Have a responsible adult stay with you for the time you are told. It is important to have someone help care for you until you are awake and alert. Take over-the-counter and prescription medicines only as told by your health care provider. If you have sleep apnea, surgery and certain medicines can increase your risk for breathing problems. Follow instructions from your health care provider about wearing your sleep device: Anytime you  are sleeping, including during daytime naps. While taking prescription pain medicines, sleeping medicines, or medicines that make you drowsy. Avoid smoking. Keep all follow-up visits as told by your health care provider. This is important. Contact a health care provider if: You keep feeling nauseous or you keep vomiting. You feel light-headed. You are still sleepy or having trouble with balance after 24 hours. You develop a rash. You have a fever. You have redness or swelling around the IV site. Get help right away if: You have trouble breathing. You have new-onset confusion at home. Summary For several hours after your procedure, you may feel tired. You may also be forgetful and have poor judgment. Have a responsible adult stay with you for the time you are told. It is important to have someone help care for you until you are awake and alert. Rest as told. Do not drive or operate machinery. Do not drink  alcohol or take sleeping pills. Get help right away if you have trouble breathing, or if you suddenly become confused. This information is not intended to replace advice given to you by your health care provider. Make sure you discuss any questions you have with your healthcare provider. Document Revised: 09/26/2019 Document Reviewed: 12/13/2018 Elsevier Patient Education  2022 [employer].

## 2020-09-25 ENCOUNTER — Other Ambulatory Visit: Payer: Self-pay

## 2020-09-25 ENCOUNTER — Encounter (HOSPITAL_COMMUNITY)
Admission: RE | Admit: 2020-09-25 | Discharge: 2020-09-25 | Disposition: A | Discharge status: Home / Self Care | Payer: BC Managed Care – PPO | Source: Ambulatory Visit | Attending: Internal Medicine | Admitting: Internal Medicine

## 2020-09-25 ENCOUNTER — Encounter (HOSPITAL_COMMUNITY): Payer: Self-pay

## 2020-09-25 DIAGNOSIS — Z01812 Encounter for preprocedural laboratory examination: Secondary | ICD-10-CM | POA: Insufficient documentation

## 2020-09-25 DIAGNOSIS — K51219 Ulcerative (chronic) proctitis with unspecified complications: Secondary | ICD-10-CM

## 2020-09-25 LAB — COMPREHENSIVE METABOLIC PANEL
ALT: 18 U/L (ref 0–44)
AST: 23 U/L (ref 15–41)
Albumin: 3.5 g/dL (ref 3.5–5.0)
Alkaline Phosphatase: 68 U/L (ref 38–126)
Anion gap: 5 (ref 5–15)
BUN: 20 mg/dL (ref 8–23)
CO2: 26 mmol/L (ref 22–32)
Calcium: 8.6 mg/dL — ABNORMAL LOW (ref 8.9–10.3)
Chloride: 108 mmol/L (ref 98–111)
Creatinine, Ser: 1.21 mg/dL (ref 0.61–1.24)
GFR, Estimated: >60 mL/min (ref >60)
Glucose, Bld: 93 mg/dL (ref 70–99)
Potassium: 4.6 mmol/L (ref 3.5–5.1)
Sodium: 139 mmol/L (ref 135–145)
Total Bilirubin: 0.7 mg/dL (ref 0.3–1.2)
Total Protein: 6.3 g/dL — ABNORMAL LOW (ref 6.5–8.1)

## 2020-09-30 ENCOUNTER — Other Ambulatory Visit: Payer: Self-pay

## 2020-09-30 ENCOUNTER — Ambulatory Visit (HOSPITAL_COMMUNITY): Payer: BC Managed Care – PPO | Admitting: Anesthesiology

## 2020-09-30 ENCOUNTER — Encounter (HOSPITAL_COMMUNITY): Payer: Self-pay | Admitting: Internal Medicine

## 2020-09-30 ENCOUNTER — Encounter (HOSPITAL_COMMUNITY)
Admission: RE | Disposition: A | Discharge status: Home / Self Care | Payer: Self-pay | Source: Home / Self Care | Attending: Internal Medicine

## 2020-09-30 ENCOUNTER — Ambulatory Visit (HOSPITAL_COMMUNITY)
Admission: RE | Admit: 2020-09-30 | Discharge: 2020-09-30 | Disposition: A | Discharge status: Home / Self Care | Payer: BC Managed Care – PPO | Attending: Internal Medicine | Admitting: Internal Medicine

## 2020-09-30 DIAGNOSIS — Z09 Encounter for follow-up examination after completed treatment for conditions other than malignant neoplasm: Secondary | ICD-10-CM | POA: Diagnosis not present

## 2020-09-30 DIAGNOSIS — Z888 Allergy status to other drugs, medicaments and biological substances status: Secondary | ICD-10-CM | POA: Diagnosis not present

## 2020-09-30 DIAGNOSIS — Z885 Allergy status to narcotic agent status: Secondary | ICD-10-CM | POA: Insufficient documentation

## 2020-09-30 DIAGNOSIS — K51219 Ulcerative (chronic) proctitis with unspecified complications: Secondary | ICD-10-CM

## 2020-09-30 DIAGNOSIS — Z91041 Radiographic dye allergy status: Secondary | ICD-10-CM | POA: Insufficient documentation

## 2020-09-30 DIAGNOSIS — Z8619 Personal history of other infectious and parasitic diseases: Secondary | ICD-10-CM | POA: Insufficient documentation

## 2020-09-30 DIAGNOSIS — Z880 Allergy status to penicillin: Secondary | ICD-10-CM | POA: Diagnosis not present

## 2020-09-30 DIAGNOSIS — Z87891 Personal history of nicotine dependence: Secondary | ICD-10-CM | POA: Diagnosis not present

## 2020-09-30 DIAGNOSIS — Z881 Allergy status to other antibiotic agents status: Secondary | ICD-10-CM | POA: Diagnosis not present

## 2020-09-30 DIAGNOSIS — K5120 Ulcerative (chronic) proctitis without complications: Secondary | ICD-10-CM | POA: Insufficient documentation

## 2020-09-30 DIAGNOSIS — Z980 Intestinal bypass and anastomosis status: Secondary | ICD-10-CM | POA: Diagnosis not present

## 2020-09-30 DIAGNOSIS — K432 Incisional hernia without obstruction or gangrene: Secondary | ICD-10-CM | POA: Diagnosis not present

## 2020-09-30 DIAGNOSIS — K51212 Ulcerative (chronic) proctitis with intestinal obstruction: Secondary | ICD-10-CM | POA: Diagnosis not present

## 2020-09-30 DIAGNOSIS — Z9049 Acquired absence of other specified parts of digestive tract: Secondary | ICD-10-CM | POA: Diagnosis not present

## 2020-09-30 DIAGNOSIS — K56609 Unspecified intestinal obstruction, unspecified as to partial versus complete obstruction: Secondary | ICD-10-CM | POA: Insufficient documentation

## 2020-09-30 HISTORY — PX: BALLOON DILATION: SHX5330

## 2020-09-30 HISTORY — PX: FLEXIBLE SIGMOIDOSCOPY: SHX5431

## 2020-09-30 SURGERY — SIGMOIDOSCOPY, FLEXIBLE
Anesthesia: General

## 2020-09-30 MED ORDER — PROPOFOL 10 MG/ML IV BOLUS
INTRAVENOUS | Status: DC | PRN
Start: 2020-09-30 — End: 2020-09-30
  Administered 2020-09-30: 50 mg via INTRAVENOUS
  Administered 2020-09-30: 100 mg via INTRAVENOUS
  Administered 2020-09-30 (×2): 50 mg via INTRAVENOUS

## 2020-09-30 MED ORDER — STERILE WATER FOR IRRIGATION IR SOLN
Status: DC | PRN
  Administered 2020-09-30: 100 mL

## 2020-09-30 MED ORDER — LIDOCAINE HCL 1 % IJ SOLN
INTRAMUSCULAR | Status: DC | PRN
  Administered 2020-09-30: 50 mg via INTRADERMAL

## 2020-09-30 MED ORDER — PROPOFOL 10 MG/ML IV BOLUS
INTRAVENOUS | Status: AC
  Filled 2020-09-30: qty 60

## 2020-09-30 MED ORDER — LACTATED RINGERS IV SOLN: INTRAVENOUS | Status: DC

## 2020-09-30 NOTE — Anesthesia Postprocedure Evaluation (Signed)
Anesthesia Post Note  Patient: Connor Small  Procedure(s) Performed: [place]  Patient location during evaluation: Short Stay Anesthesia Type: General Level of consciousness: awake and alert Pain management: pain level controlled Vital Signs Assessment: post-procedure vital signs reviewed and stable Respiratory status: spontaneous breathing Cardiovascular status: blood pressure returned to baseline and stable Postop Assessment: no apparent nausea or vomiting Anesthetic complications: no   No notable events documented.   Last Vitals:  Vitals:   09/30/20 0839 09/30/20 1017  BP: 131/77 110/73  Pulse: (!) 56 (!) 52  Resp: (!) 22 16  Temp: 36.9 C   SpO2: 99% 100%    Last Pain:  Vitals:   09/30/20 1017  TempSrc: Oral  PainSc:                  [name]

## 2020-09-30 NOTE — Anesthesia Preprocedure Evaluation (Signed)
Anesthesia Evaluation  Patient identified by MRN, date of birth, ID band Patient awake    Reviewed: Allergy & Precautions, H&P , NPO status , Patient's Chart, lab work & pertinent test results, reviewed documented beta blocker date and time   Airway Mallampati: II  TM Distance: >3 FB Neck ROM: full    Dental no notable dental hx.    Pulmonary neg pulmonary ROS, former smoker,    Pulmonary exam normal breath sounds clear to auscultation       Cardiovascular Exercise Tolerance: Good negative cardio ROS   Rhythm:regular Rate:Normal     Neuro/Psych  Neuromuscular disease CVA, Residual Symptoms negative psych ROS   GI/Hepatic Neg liver ROS, PUD,   Endo/Other  negative endocrine ROS  Renal/GU CRFRenal disease  negative genitourinary   Musculoskeletal   Abdominal   Peds  Hematology  (+) Blood dyscrasia, anemia ,   Anesthesia Other Findings   Reproductive/Obstetrics negative OB ROS                             Anesthesia Physical Anesthesia Plan  ASA: 3  Anesthesia Plan: General   Post-op Pain Management:    Induction:   PONV Risk Score and Plan: Propofol infusion  Airway Management Planned:   Additional Equipment:   Intra-op Plan:   Post-operative Plan:   Informed Consent: I have reviewed the patients History and Physical, chart, labs and discussed the procedure including the risks, benefits and alternatives for the proposed anesthesia with the patient or authorized representative who has indicated his/her understanding and acceptance.     Dental Advisory Given  Plan Discussed with: CRNA  Anesthesia Plan Comments:         Anesthesia Quick Evaluation

## 2020-09-30 NOTE — Op Note (Signed)
[facility] Patient Name: Connor Small Procedure Date: 09/30/2020 9:24 AM MRN: [id number] Date of Birth: 07/05/1960 Attending MD: Lionel December , MD CSN: [id number] Age: 60 Admit Type: Outpatient Procedure:                Flexible Sigmoidoscopy Indications:              Ulcerative colitis, Follow-up of ulcerative                            colitis, For therapy of ulcerative colitis, Chronic                            ulcerative proctitis, Follow-up of chronic                            ulcerative proctitis, Disease activity assessment                            of chronic ulcerative proctitis, Assess therapeutic                            response to therapy of chronic ulcerative proctitis Providers:                Lionel December, MD, Angelica Ran, Dyann Ruddle Referring MD:             Eartha Inch, MD Medicines:                Propofol per Anesthesia Complications:            No immediate complications. Estimated Blood Loss:     Estimated blood loss was minimal. Procedure:                Pre-Anesthesia Assessment:                           - Prior to the procedure, a History and Physical                            was performed, and patient medications and                            allergies were reviewed. The patient's tolerance of                            previous anesthesia was also reviewed. The risks                            and benefits of the procedure and the sedation                            options and risks were discussed with the patient.                            All questions were answered, and informed consent  was obtained. Prior Anticoagulants: The patient has                            taken no previous anticoagulant or antiplatelet                            agents except for aspirin. ASA Grade Assessment: II                            - A patient with mild systemic disease. After                            reviewing the  risks and benefits, the patient was                            deemed in satisfactory condition to undergo the                            procedure.                           After obtaining informed consent, the scope was                            passed under direct vision. The [id number]-HQ190L                            ([id number]) scope was introduced through the anus and                            advanced to the 30 cm from the anal verge. The                            flexible sigmoidoscopy was accomplished without                            difficulty. The patient tolerated the procedure                            well. The quality of the bowel preparation was                            excellent. Scope In: 9:58:28 AM Scope Out: 10:10:57 AM Total Procedure Duration: 0 hours 12 minutes 29 seconds  Findings:      The perianal and digital rectal examinations were normal.      The rectum appeared normal.      A benign-appearing, intrinsic moderate stenosis measuring less than one       cm (in length) was found at the anastomosis and was traversed after       dilation with ultraslim scope. Was not able to pass pediatric       colonoscope across it.. Stricture was dilated with balloon dilator from       12 to 13.5 mm. tThe dilation site was examined and showed mild mucosal  disruption, mild improvement in luminal narrowing and no perforation.      Neoterminal ileum appeared normal. Impression:               - Rectal mucosa is normal. Suture material noted                            next to ileorectal anastomosis.                           - Ileorectal anastomotic stricture. Stricture                            dilated with balloon dilated to 13.5 mm..                           - Neoterminal ileal mucosa normal..                           - No specimens collected.                           Comment: Ulcerative proctitis is completely healed. Moderate Sedation:      Per Anesthesia  Care Recommendation:           - Discharge patient to home (with spouse).                           - Resume previous diet today.                           - Continue present medications.                           - Return to GI clinic in 6 months. Procedure Code(s):        --- Professional ---                           [phone], Sigmoidoscopy, flexible; diagnostic,                            including collection of specimen(s) by brushing or                            washing, when performed (separate procedure) Diagnosis Code(s):        --- Professional ---                           Q46.962, Ulcerative (chronic) proctitis with                            intestinal obstruction CPT copyright 2019 [employer]. All rights reserved. The codes documented in this report are preliminary and upon coder review may  be revised to meet current compliance requirements. Lionel December, MD Lionel December, MD 09/30/2020 10:24:08 AM This report has been signed electronically. Number of Addenda: 0

## 2020-09-30 NOTE — Discharge Instructions (Addendum)
Resume usual medications and diet as before. No driving for 24 hours. Office visit in March 2023 as planned.

## 2020-09-30 NOTE — H&P (Signed)
Connor Small is an 61 y.o. male.   Chief Complaint: Patient is here for flexible sigmoidoscopy. HPI: Patient 60 year old Caucasian male with history of ulcerative colitis only has rectum in situ was required combination of biologic and low-dose immunomodulator to control his disease.  He is here for a diagnostic flexible sigmoidoscopy to determine disease activity.  He is having 6-7 stools per day.  He rarely notices blood with his bowel movement after he has been working in his yard.  His appetite is good.  He denies abdominal pain. His last exam was 1 year ago with removal of ulcerated polyp which was inflammatory polyp with ulceration but no dysplasia.   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   HAP (hospital-acquired pneumonia) 04/21/2012   Hemorrhoids    History of acute renal failure    post-op  total colectomy 02-27-2012--  required dialysis--  resolved   History of ARDS    02-27-2012  post op total colectomy -- septic shock-  required intubation/ventilator for 3-4 wks   History of atrial fibrillation    02/ 2014--  no issues since   History of Clostridium difficile colitis    02-27-2012--  fulminant C.Diff colitis caused toxic megacolon--  s/p  total colectomy   History of DVT (deep vein thrombosis)    2014--  right upper extremity   History of kidney stones    History of pleural effusion    acute- bilateral in setting septic shock post op Feb 2014   History of septic shock dx's at this time--  ARDS, ARF, CVA, A-Fib   Feb 2014--  post op total colectomy from toxic megacolon/ fulminant C.Diff Colitis   History of stroke March 2014 --  residual right weaker than prior to cva, unable to make fist   Bilateral cerebral and right cerebellar  infarcts - felt to be embolic vs hypotensive (in setting of septic shock, transient a-fib, ARDS)   PAF (paroxysmal atrial fibrillation) ([place]) 03/15/2012   Rectal bleed    Renal calculi    bilateral--  left > right   Ulcerative colitis followed by dr Laural Golden   Distal UC dx 2004   Urgency of urination     Past Surgical History:  Procedure Laterality Date   APPLICATION OF WOUND VAC N/A 03/26/2012   Procedure: APPLICATION OF WOUND VAC;  Surgeon: Edward Jolly, MD;  Location: [place];  Service: General;  Laterality: N/A;   BALLOON DILATION N/A 01/15/2018   Procedure: [place];  Surgeon: Rogene Houston, MD;  [facility];  Service: Endoscopy;  Laterality: N/A;  ileorectum   BIOPSY  01/19/2017   Procedure: BIOPSY;  Surgeon: Rogene Houston, MD;  [facility];  Service: Endoscopy;;  rectal   CECOSTOMY  02/27/2012   Procedure: CECOSTOMY;  Surgeon: Donato Heinz, MD;  [facility];  Service: General;  Laterality: N/A;  Cecostomy Tube Placement   COLECTOMY  02/28/2012   Procedure: TOTAL COLECTOMY;  Surgeon: Donato Heinz, MD;  [facility];  Service: General;  Laterality: N/A;   COLONOSCOPY  02/16/2011   Procedure: COLONOSCOPY;  Surgeon: Rogene Houston, MD;  [facility];  Service: Endoscopy;  Laterality: N/A;  100   CYSTOSCOPY W/ URETERAL STENT PLACEMENT Bilateral  03/20/2012   Procedure: CYSTOSCOPY WITH RETROGRADE PYELOGRAM/;  Surgeon: Alexis Frock, MD;  Location: [place];  Service: Urology;  Laterality: Bilateral. Pt reports no stents were placed.   CYSTOSCOPY WITH RETROGRADE PYELOGRAM, URETEROSCOPY AND STENT PLACEMENT Left 07/16/2014   Procedure: CYSTOSCOPY WITH RETROGRADE PYELOGRAM,  AND LEFT STENT PLACEMENT;  Surgeon: Alexis Frock, MD;  [facility];  Service: Urology;  Laterality: Left;   CYSTOSCOPY WITH RETROGRADE PYELOGRAM, URETEROSCOPY AND STENT PLACEMENT Bilateral 07/25/2014   Procedure: CYSTOSCOPY WITH RETROGRADE PYELOGRAM,  URETEROSCOPY AND BILATERAL STENT PLACEMENT;  Surgeon: Alexis Frock, MD;  Location[facility];  Service: Urology;  Laterality: Bilateral;   ESOPHAGOGASTRODUODENOSCOPY N/A 01/14/2016   Procedure: ESOPHAGOGASTRODUODENOSCOPY (EGD);  Surgeon: Rogene Houston, MD;  [facility];  Service: Endoscopy;  Laterality: N/A;   FLEXIBLE SIGMOIDOSCOPY  02/27/2012   Procedure: FLEXIBLE SIGMOIDOSCOPY;  Surgeon: Rogene Houston, MD;  [facility];  Service: Endoscopy;  Laterality: N/A;  with colonic decompression   FLEXIBLE SIGMOIDOSCOPY N/A 02/06/2013   Procedure: FLEXIBLE SIGMOIDOSCOPY;  Surgeon: Rogene Houston, MD;  [facility];  Service: Endoscopy;  Laterality: N/A;  100-moved to 1200 Ann notified pt   FLEXIBLE SIGMOIDOSCOPY N/A 03/27/2013   Procedure: FLEXIBLE SIGMOIDOSCOPY;  Surgeon: Rogene Houston, MD;  [facility];  Service: Endoscopy;  Laterality: N/A;  [place] N/A 12/11/2013   Procedure: FLEXIBLE SIGMOIDOSCOPY;  Surgeon: Rogene Houston, MD;  [facility];  Service: Endoscopy;  Laterality: N/A;  200   FLEXIBLE SIGMOIDOSCOPY N/A 01/07/2015   Procedure: FLEXIBLE SIGMOIDOSCOPY;  Surgeon: Rogene Houston, MD;  [facility];  Service: Endoscopy;  Laterality: N/A;  [place] N/A 01/14/2016   Procedure: FLEXIBLE SIGMOIDOSCOPY;  Surgeon: Rogene Houston, MD;  [facility];  Service: Endoscopy;  Laterality: N/A;   FLEXIBLE SIGMOIDOSCOPY N/A 01/19/2017   Procedure: FLEXIBLE SIGMOIDOSCOPY;  Surgeon: Rogene Houston, MD;  [facility];  Service: Endoscopy;  Laterality: N/A;  [place] N/A 01/15/2018   Procedure: FLEXIBLE SIGMOIDOSCOPY;  Surgeon: Rogene Houston, MD;  [facility];  Service: Endoscopy;  Laterality: N/A;  9:30   FLEXIBLE SIGMOIDOSCOPY N/A 10/04/2019   Procedure: FLEXIBLE SIGMOIDOSCOPY;  Surgeon: Rogene Houston, MD;  [facility];  Service: Endoscopy;  Laterality: N/A;  730   HOLMIUM LASER APPLICATION Left 09/30/7891   Procedure: HOLMIUM LASER APPLICATION, LEFT ONLY ;  Surgeon: Alexis Frock, MD;  Location[facility];  Service: Urology;  Laterality: Left;   ILEOSTOMY  02/28/2012   Procedure: ILEOSTOMY;  Surgeon: Donato Heinz, MD;  [facility];  Service: General;  Laterality: N/A;   ILEOSTOMY CLOSURE N/A 05/27/2013   Procedure: ILEOSTOMY TAKEDOWN WITH ILEOPROCTOSTOMY;  Surgeon: Edward Jolly, MD;  [facility];  Service: General;  Laterality: N/A;   LAPAROTOMY  02/27/2012   Procedure: EXPLORATORY LAPAROTOMY;  Surgeon: Donato Heinz, MD;  [facility];  Service: General;  Laterality: N/A;   LAPAROTOMY N/A 03/26/2012   Procedure: EXPLORATORY LAPAROTOMY;  Surgeon: Edward Jolly, MD;  Location: [place];  Service: General;  Laterality: N/A;   LAPAROTOMY N/A 03/29/2012   Procedure: EXPLORATORY LAPAROTOMY, PARTIAL WOUND CLOSURE;  Surgeon: Edward Jolly, MD;  Location: [place];  Service: General;  Laterality: N/A;   LIVER BIOPSY N/A 03/26/2012   Procedure: LIVER BIOPSY;  Surgeon: Edward Jolly, MD;  Location:  [place] [facility];  Service: General;  Laterality: N/A;   POLYPECTOMY  10/04/2019   Procedure: POLYPECTOMY;  Surgeon: Rogene Houston, MD;  [facility];  Service: Endoscopy;;   SIGMOID COLECTOMY /  LOW ANTERIOR RESECTION/  END SIGMOID COLOSTOMY  11-07-2010   [facility]   Sigmoid colonic perferation---  TAKE DOWN COLOSTOMY,  Jan 2013   TRANSTHORACIC ECHOCARDIOGRAM  03-26-2012  (in setting of septic shock)   report states  poorly visualized--  normal LVSF, ef 50-55% and no pericardial effusion   VACUUM ASSISTED CLOSURE CHANGE N/A 03/29/2012   Procedure: Open ABDOMINAL VACUUM  CHANGE;  Surgeon: Edward Jolly, MD;  Location: [place];  Service: General;  Laterality: N/A;   VACUUM ASSISTED CLOSURE CHANGE N/A 04/01/2012   Procedure: removal of abdominal vac dressing and  abdominal closure;  Surgeon: Edward Jolly, MD;  Location: [place];  Service: General;  Laterality: N/A;    Family History  Problem Relation Age of Onset   Cancer Sister    Healthy Daughter    Hypertension Mother    Colon cancer Neg Hx    Social History:  reports that he quit smoking about 36 years ago. His smoking use included cigarettes. He has a 6.00 pack-year smoking history. He has never used smokeless tobacco. He reports that he does not drink alcohol and does not use drugs.  Allergies:  Allergies  Allergen Reactions   Penicillins Other (See Comments)    "Heart rate slowed down"  bradycardia DID THE REACTION INVOLVE: Swelling of the face/tongue/throat, SOB, or low BP? Yes Sudden or severe rash/hives, skin peeling, or the inside of the mouth or nose? No Did it require medical treatment? Yes When did it last happen?      years  If all above answers are "NO", may proceed with cephalosporin use.     Clindamycin/Lincomycin Diarrhea    C-diff   Morphine And Related Nausea And Vomiting   Claritin [Loratadine] Rash   Contrast Media [Iodinated Diagnostic Agents] Hives    Patient reports hives after IV contrast.    Primaxin [Imipenem] Rash    Medications Prior to Admission  Medication Sig Dispense Refill   acetaminophen (TYLENOL) 500 MG tablet Take 1,000 mg by mouth every 6 (six) hours as needed for moderate pain or headache.      diphenhydrAMINE (BENADRYL) 25 MG tablet Take 25 mg by mouth daily as needed (prior to remicade infusion). Take before remicade infusion     ferrous sulfate 325 (65 FE) MG tablet Take 325 mg by mouth 2 (two) times daily with a meal.     fluticasone (FLONASE) 50 MCG/ACT nasal spray Place 1 spray into both nostrils daily as needed for allergies or rhinitis.     mercaptopurine (PURINETHOL) 50 MG tablet Take 1 tablet (50 mg total) by mouth daily. Give on an empty stomach 1 hour before or 2 hours after meals. Caution: Chemotherapy. 30 tablet 5   mesalamine  (CANASA) 1000 MG suppository UNWRAP AND INSERT 1  SUPPOSITORY RECTALLY AT  BEDTIME 90 suppository 3   Multiple Minerals-Vitamins (CITRACAL PLUS PO) Take 1 tablet by mouth daily.     Multiple Vitamin (MULTIVITAMIN WITH MINERALS) TABS tablet Take 1 tablet by mouth daily.     vitamin C (ASCORBIC ACID) 500 MG tablet Take 500 mg by mouth daily.     inFLIXimab (REMICADE) 100 MG injection Inject 5 mg into the vein every 8 (eight) weeks. Patient is receiving 5 mg /kg IV every 8  weeks.      No results found for this or any previous visit (from the past 48 hour(s)). No results found.  Review of Systems  Blood pressure 131/77, pulse (!) 56, temperature 98.5 F (36.9 C), temperature source Oral, resp. rate (!) 22, height 5' 9"  (1.753 m), weight 81.2 kg, SpO2 99 %. Physical Exam HENT:     Mouth/Throat:     Mouth: Mucous membranes are moist.     Pharynx: Oropharynx is clear.  Eyes:     General: No scleral icterus.    Conjunctiva/sclera: Conjunctivae normal.  Cardiovascular:     Rate and Rhythm: Normal rate and regular rhythm.     Heart sounds: Normal heart sounds. No murmur heard. Pulmonary:     Effort: Pulmonary effort is normal.     Breath sounds: Normal breath sounds.  Abdominal:     Comments: Abdominal examination reveals lower midline scar large incisional hernia which is partially reducible.  Abdomen is soft and nontender with organomegaly or masses.  Musculoskeletal:        General: No swelling.     Cervical back: Neck supple.  Lymphadenopathy:     Cervical: No cervical adenopathy.  Skin:    General: Skin is warm and dry.  Neurological:     Mental Status: He is alert.     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 Flexible sigmoidoscopy to determine disease activity and assess ileorectal anastomotic stricture with dilation if indicated.  Hildred Laser, MD 09/30/2020, 9:50 AM

## 2020-09-30 NOTE — Transfer of Care (Signed)
Immediate Anesthesia Transfer of Care Note  Patient: Connor Small  Procedure(s) Performed: [place]  Patient Location: Short Stay  Anesthesia Type:General  Level of Consciousness: awake  Airway & Oxygen Therapy: Patient Spontanous Breathing  Post-op Assessment: Report given to RN  Post vital signs: Reviewed  Last Vitals:  Vitals Value Taken Time  BP    Temp    Pulse    Resp    SpO2      Last Pain:  Vitals:   09/30/20 0953  TempSrc:   PainSc: 0-No pain      Patients Stated Pain Goal: 8 (11/24/26 1188)  Complications: No notable events documented.

## 2020-10-12 ENCOUNTER — Encounter (HOSPITAL_COMMUNITY): Payer: Self-pay | Admitting: Internal Medicine

## 2020-10-15 ENCOUNTER — Other Ambulatory Visit (INDEPENDENT_AMBULATORY_CARE_PROVIDER_SITE_OTHER): Payer: Self-pay | Admitting: Internal Medicine

## 2020-10-15 NOTE — Telephone Encounter (Signed)
Last seen 07/07/20

## 2020-12-28 ENCOUNTER — Encounter (INDEPENDENT_AMBULATORY_CARE_PROVIDER_SITE_OTHER): Payer: Self-pay | Admitting: Internal Medicine

## 2021-02-01 ENCOUNTER — Encounter (INDEPENDENT_AMBULATORY_CARE_PROVIDER_SITE_OTHER): Payer: Self-pay | Admitting: Internal Medicine

## 2021-03-18 ENCOUNTER — Other Ambulatory Visit (INDEPENDENT_AMBULATORY_CARE_PROVIDER_SITE_OTHER): Payer: Self-pay | Admitting: Internal Medicine

## 2021-03-18 DIAGNOSIS — K51219 Ulcerative (chronic) proctitis with unspecified complications: Secondary | ICD-10-CM

## 2021-03-24 LAB — COMPREHENSIVE METABOLIC PANEL
AG Ratio: 1.4 (calc) (ref 1.0–2.5)
ALT: 12 U/L (ref 9–46)
AST: 19 U/L (ref 10–35)
Albumin: 3.7 g/dL (ref 3.6–5.1)
Alkaline phosphatase (APISO): 76 U/L (ref 35–144)
BUN: 25 mg/dL (ref 7–25)
CO2: 26 mmol/L (ref 20–32)
Calcium: 8.9 mg/dL (ref 8.6–10.3)
Chloride: 107 mmol/L (ref 98–110)
Creat: 1.31 mg/dL (ref 0.70–1.35)
Globulin: 2.6 g/dL (calc) (ref 1.9–3.7)
Glucose, Bld: 92 mg/dL (ref 65–139)
Potassium: 4.5 mmol/L (ref 3.5–5.3)
Sodium: 139 mmol/L (ref 135–146)
Total Bilirubin: 0.4 mg/dL (ref 0.2–1.2)
Total Protein: 6.3 g/dL (ref 6.1–8.1)

## 2021-03-24 LAB — CBC WITH DIFFERENTIAL/PLATELET
Absolute Monocytes: 694 cells/uL (ref 200–950)
Basophils Absolute: 21 cells/uL (ref 0–200)
Basophils Relative: 0.4 %
Eosinophils Absolute: 133 cells/uL (ref 15–500)
Eosinophils Relative: 2.5 %
HCT: 37.1 % — ABNORMAL LOW (ref 38.5–50.0)
Hemoglobin: 12.9 g/dL — ABNORMAL LOW (ref 13.2–17.1)
Lymphs Abs: 1320 cells/uL (ref 850–3900)
MCH: 35.8 pg — ABNORMAL HIGH (ref 27.0–33.0)
MCHC: 34.8 g/dL (ref 32.0–36.0)
MCV: 103.1 fL — ABNORMAL HIGH (ref 80.0–100.0)
MPV: 9.6 fL (ref 7.5–12.5)
Monocytes Relative: 13.1 %
Neutro Abs: 3132 cells/uL (ref 1500–7800)
Neutrophils Relative %: 59.1 %
Platelets: 166 10*3/uL (ref 140–400)
RBC: 3.6 10*6/uL — ABNORMAL LOW (ref 4.20–5.80)
RDW: 13.3 % (ref 11.0–15.0)
Total Lymphocyte: 24.9 %
WBC: 5.3 10*3/uL (ref 3.8–10.8)

## 2021-04-06 ENCOUNTER — Ambulatory Visit (INDEPENDENT_AMBULATORY_CARE_PROVIDER_SITE_OTHER): Payer: BC Managed Care – PPO | Admitting: Internal Medicine

## 2021-04-06 ENCOUNTER — Other Ambulatory Visit: Payer: Self-pay

## 2021-04-06 ENCOUNTER — Encounter (INDEPENDENT_AMBULATORY_CARE_PROVIDER_SITE_OTHER): Payer: Self-pay | Admitting: Internal Medicine

## 2021-04-06 VITALS — BP 118/72 | HR 55 | Temp 98.1°F | Ht 69.0 in | Wt 182.1 lb

## 2021-04-06 DIAGNOSIS — K56699 Other intestinal obstruction unspecified as to partial versus complete obstruction: Secondary | ICD-10-CM | POA: Diagnosis not present

## 2021-04-06 DIAGNOSIS — D649 Anemia, unspecified: Secondary | ICD-10-CM | POA: Diagnosis not present

## 2021-04-06 DIAGNOSIS — K51219 Ulcerative (chronic) proctitis with unspecified complications: Secondary | ICD-10-CM | POA: Diagnosis not present

## 2021-04-06 MED ORDER — MERCAPTOPURINE 50 MG PO TABS: 50.0000 mg | ORAL_TABLET | Freq: Every day | ORAL | 1 refills | Status: DC

## 2021-04-06 MED ORDER — MESALAMINE 1000 MG RE SUPP: RECTAL | 3 refills | Status: DC

## 2021-04-06 NOTE — Progress Notes (Signed)
Presenting complaint; ? ?Follow-up for ulcerative colitis/proctitis(patient only has rectum in situ) ? ?Database and subjective: ? ?Patient is 61 year old Caucasian male who is here for scheduled visit.  He was last seen on 07/07/2020.  He has 19-year history of distal ulcerative colitis.   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  Colostomy was subsequently reversed.  Perforation was not due to active disease as he was in remission. ?His second life-threatening illness was fulminant C. difficile colitis in February 2014 after he took clindamycin for dental problems.  He underwent emergency subtotal colectomy.  Hospital course was complicated and prolonged.  He was able to have his ileostomy reversed.  ?He developed active disease involving his rectum and he has been on infliximab for a number of years.  He is now on Avsola which is infliximab bio-osmolar His disease could not be controlled.  Therefore 6-MP was added at a low dose in September 2020.  Please notice TPMT assay was normal. ?Since then he has done well. ?He has developed ileorectal anastomotic stricture which has been dilated on multiple occasions most recently in September 2022.  This exam reveals healed proctitis. ? ?Patient states he is doing well.  He has 5-7 stools per day.  He only notices blood on the tissue once or twice a month.  His stools are semiformed or soft.  Stool caliber is the size of his palm and has not changed recently.  He denies abdominal pain nausea vomiting.  His appetite is good.  He has gained 5 pounds since his last visit. ?He is using Canasa suppository every night.  He needs a refill.  He is working full-time.  He feels quality of life is very good given what he has been through. ? ? ?Current Medications: ?Outpatient Encounter Medications as of 04/06/2021  ?Medication Sig  ? acetaminophen (TYLENOL) 500 MG tablet Take  1,000 mg by mouth every 6 (six) hours as needed for moderate pain or headache.   ? diphenhydrAMINE (BENADRYL) 25 MG tablet Take 25 mg by mouth daily as needed. Take before Avsola infusion  ? ferrous sulfate 325 (65 FE) MG tablet Take 325 mg by mouth 2 (two) times daily with a meal.  ? fluticasone (FLONASE) 50 MCG/ACT nasal spray Place 1 spray into both nostrils daily as needed for allergies or rhinitis.  ? inFLIXimab-axxq (AVSOLA IV) Inject into the vein. 88m/kg Infusion every 8 weeks  ? mercaptopurine (PURINETHOL) 50 MG tablet TAKE (1) TABLET BY MOUTH ONCE DAILY. TAKE ON AN EMPTY STOMACH 1 HOUR BEFORE OR 2 HOURS AFTER MEALS.  ? mesalamine (CANASA) 1000 MG suppository UNWRAP AND INSERT 1  SUPPOSITORY RECTALLY AT  BEDTIME  ? Multiple Minerals-Vitamins (CITRACAL PLUS PO) Take 1 tablet by mouth daily.  ? Multiple Vitamin (MULTIVITAMIN WITH MINERALS) TABS tablet Take 1 tablet by mouth daily.  ? vitamin C (ASCORBIC ACID) 500 MG tablet Take 500 mg by mouth daily.  ? [DISCONTINUED] inFLIXimab (REMICADE) 100 MG injection Inject 5 mg into the vein every 8 (eight) weeks. Patient is receiving 5 mg /kg IV every 8 weeks.  ? ?No facility-administered encounter medications on file as of 04/06/2021.  ? ? ? ?Objective: ?Blood pressure 118/72, pulse (!) 55, temperature 98.1 ?F (36.7 ?C), temperature source Oral, height _0  (1.753 m), weight 182 lb 1.6 oz (82.6 kg). ?Patient is alert and in no acute distress. ?Conjunctiva is pink. Sclera is nonicteric ?Oropharyngeal mucosa is normal. ?  No neck masses or thyromegaly noted. ?Cardiac exam with regular rhythm normal S1 and S2. No murmur or gallop noted. ?Lungs are clear to auscultation. ?Abdomen he has low midline scar along with small scar in right lower quadrant.  On palpation abdomen is soft.  He has a large ventral hernia and hypogastric region.  No organomegaly or masses. ?No LE edema or clubbing noted. ?He has weak grip in his right hand secondary to CVA that he suffered during  protracted illness in 2014. ? ?Labs/studies Results: ? ? ?CBC Latest Ref Rng & Units 03/23/2021 08/04/2020 05/08/2020  ?WBC 3.8 - 10.8 Thousand/uL 5.3 4.5 4.4  ?Hemoglobin 13.2 - 17.1 g/dL 12.9(L) 12.3(L) 13.3  ?Hematocrit 38.5 - 50.0 % 37.1(L) 36.5(L) 39.4  ?Platelets 140 - 400 Thousand/uL 166 153 205  ?  ?CMP Latest Ref Rng & Units 03/23/2021 09/25/2020 04/06/2020  ?Glucose 65 - 139 mg/dL 92 93 -  ?BUN 7 - 25 mg/dL 25 20 -  ?Creatinine 0.70 - 1.35 mg/dL 1.31 1.21 -  ?Sodium 135 - 146 mmol/L 139 139 -  ?Potassium 3.5 - 5.3 mmol/L 4.5 4.6 -  ?Chloride 98 - 110 mmol/L 107 108 -  ?CO2 20 - 32 mmol/L 26 26 -  ?Calcium 8.6 - 10.3 mg/dL 8.9 8.6(L) -  ?Total Protein 6.1 - 8.1 g/dL 6.3 6.3(L) 6.1  ?Total Bilirubin 0.2 - 1.2 mg/dL 0.4 0.7 0.4  ?Alkaline Phos 38 - 126 U/L - 68 -  ?AST 10 - 35 U/L _0 ?ALT 9 - 46 U/L _1 ?  ?Hepatic Function Latest Ref Rng & Units 03/23/2021 09/25/2020 04/06/2020  ?Total Protein 6.1 - 8.1 g/dL 6.3 6.3(L) 6.1  ?Albumin 3.5 - 5.0 g/dL - 3.5 -  ?AST 10 - 35 U/L _2 ?ALT 9 - 46 U/L _3 ?Alk Phosphatase 38 - 126 U/L - 68 -  ?Total Bilirubin 0.2 - 1.2 mg/dL 0.4 0.7 0.4  ?Bilirubin, Direct 0.0 - 0.2 mg/dL - - 0.1  ?  ? ?Blood work from 03/23/2021 reviewed with patient. ? ?Assessment: ? ?#1.  Ulcerative proctitis.  His disease has been recalcitrant.  He has required infliximab(now on Ansola), low-dose 6-MP and mesalamine to control his disease.  He has developed ileorectal stricture which has required periodic dilation.  We will determine at the time of next visit whether he should undergo sigmoidoscopy this year or wait until September 2024. ? ?#2.  Anemia.  He has history of anemia possibly due to chronic disease but is hemoglobin is almost back to normal. ? ? ?Plan: ? ?Next blood work due in June 2023.  This is include CBC with differential and comprehensive chemistry panel. ?New prescription for Canasa suppository sent to patient's pharmacy for 90 days with 3 refills. ?Patient will  call office if he notices abdominal cramping or change in caliber of her stool. ?Office visit with Dr. Jenetta Downer in 6 months. ? ? ? ? ? ? ? ? ?

## 2021-04-06 NOTE — Patient Instructions (Signed)
Notify if there is caliber change to his stool. ?Next blood work to be performed in first week of June 2023. ?

## 2021-04-07 ENCOUNTER — Other Ambulatory Visit (INDEPENDENT_AMBULATORY_CARE_PROVIDER_SITE_OTHER): Payer: Self-pay | Admitting: Gastroenterology

## 2021-04-07 ENCOUNTER — Telehealth (INDEPENDENT_AMBULATORY_CARE_PROVIDER_SITE_OTHER): Payer: Self-pay | Admitting: Internal Medicine

## 2021-04-07 MED ORDER — MESALAMINE 1000 MG RE SUPP: RECTAL | 3 refills | Status: DC

## 2021-04-07 NOTE — Telephone Encounter (Signed)
Patient left voice mail message stating he saw Dr Laural Golden yesterday - he was to refill his medication - stated Dr Laural Golden mentioned E sending but stated he gets his medication thru mail order - please advise - ph# [phone] ?

## 2021-06-11 ENCOUNTER — Other Ambulatory Visit (INDEPENDENT_AMBULATORY_CARE_PROVIDER_SITE_OTHER): Payer: Self-pay | Admitting: *Deleted

## 2021-06-11 DIAGNOSIS — K51219 Ulcerative (chronic) proctitis with unspecified complications: Secondary | ICD-10-CM

## 2021-06-11 DIAGNOSIS — D649 Anemia, unspecified: Secondary | ICD-10-CM

## 2021-07-24 LAB — COMPREHENSIVE METABOLIC PANEL
AG Ratio: 1.5 (calc) (ref 1.0–2.5)
ALT: 14 U/L (ref 9–46)
AST: 20 U/L (ref 10–35)
Albumin: 3.7 g/dL (ref 3.6–5.1)
Alkaline phosphatase (APISO): 73 U/L (ref 35–144)
BUN/Creatinine Ratio: 17 (calc) (ref 6–22)
BUN: 24 mg/dL (ref 7–25)
CO2: 26 mmol/L (ref 20–32)
Calcium: 8.1 mg/dL — ABNORMAL LOW (ref 8.6–10.3)
Chloride: 109 mmol/L (ref 98–110)
Creat: 1.4 mg/dL — ABNORMAL HIGH (ref 0.70–1.35)
Globulin: 2.4 g/dL (calc) (ref 1.9–3.7)
Glucose, Bld: 114 mg/dL — ABNORMAL HIGH (ref 65–99)
Potassium: 4.4 mmol/L (ref 3.5–5.3)
Sodium: 141 mmol/L (ref 135–146)
Total Bilirubin: 0.4 mg/dL (ref 0.2–1.2)
Total Protein: 6.1 g/dL (ref 6.1–8.1)

## 2021-07-24 LAB — CBC WITH DIFFERENTIAL/PLATELET
Absolute Monocytes: 596 cells/uL (ref 200–950)
Basophils Absolute: 20 cells/uL (ref 0–200)
Basophils Relative: 0.5 %
Eosinophils Absolute: 88 cells/uL (ref 15–500)
Eosinophils Relative: 2.2 %
HCT: 36.8 % — ABNORMAL LOW (ref 38.5–50.0)
Hemoglobin: 12.9 g/dL — ABNORMAL LOW (ref 13.2–17.1)
Lymphs Abs: 1092 cells/uL (ref 850–3900)
MCH: 36.5 pg — ABNORMAL HIGH (ref 27.0–33.0)
MCHC: 35.1 g/dL (ref 32.0–36.0)
MCV: 104.2 fL — ABNORMAL HIGH (ref 80.0–100.0)
MPV: 9.5 fL (ref 7.5–12.5)
Monocytes Relative: 14.9 %
Neutro Abs: 2204 cells/uL (ref 1500–7800)
Neutrophils Relative %: 55.1 %
Platelets: 148 10*3/uL (ref 140–400)
RBC: 3.53 10*6/uL — ABNORMAL LOW (ref 4.20–5.80)
RDW: 13.3 % (ref 11.0–15.0)
Total Lymphocyte: 27.3 %
WBC: 4 10*3/uL (ref 3.8–10.8)

## 2021-09-24 ENCOUNTER — Ambulatory Visit (HOSPITAL_COMMUNITY)
Admission: RE | Admit: 2021-09-24 | Discharge: 2021-09-24 | Disposition: A | Discharge status: Home / Self Care | Payer: BC Managed Care – PPO | Source: Ambulatory Visit | Attending: Physician Assistant | Admitting: Physician Assistant

## 2021-09-24 ENCOUNTER — Other Ambulatory Visit (HOSPITAL_COMMUNITY): Payer: Self-pay | Admitting: Physician Assistant

## 2021-09-24 DIAGNOSIS — J069 Acute upper respiratory infection, unspecified: Secondary | ICD-10-CM

## 2021-10-04 ENCOUNTER — Encounter (INDEPENDENT_AMBULATORY_CARE_PROVIDER_SITE_OTHER): Payer: Self-pay | Admitting: Gastroenterology

## 2021-10-04 ENCOUNTER — Ambulatory Visit (INDEPENDENT_AMBULATORY_CARE_PROVIDER_SITE_OTHER): Payer: BC Managed Care – PPO | Admitting: Gastroenterology

## 2021-10-04 VITALS — BP 113/71 | HR 67 | Temp 98.2°F | Ht 69.0 in | Wt 177.5 lb

## 2021-10-04 DIAGNOSIS — K51219 Ulcerative (chronic) proctitis with unspecified complications: Secondary | ICD-10-CM | POA: Diagnosis not present

## 2021-10-04 MED ORDER — MERCAPTOPURINE 50 MG PO TABS: 50.0000 mg | ORAL_TABLET | Freq: Every day | ORAL | 1 refills | Status: DC

## 2021-10-04 NOTE — Patient Instructions (Addendum)
Continue Avsola every 8 weeks Continue 6-MP 50 mg qday Repeat flexible sigmoidoscopy on 09/2022

## 2021-10-04 NOTE — Progress Notes (Signed)
Connor Small, M.D. Gastroenterology & Hepatology [facility] [address] [place], [place] [address]  Primary Care Physician: [place]on, [place] [id number]  I will communicate my assessment and recommendations to the referring MD via EMR.  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  The patient was last seen on 04/06/2021. At that time, the patient was advised to start [place] in conjunction with his infliximab and 6-MP.  States he is having 4-7 Bms per day which have a [place] 5 consistency. Very occasionally he has some fresh blood in his stool, which he thinks is related to hemorrhoids.  States that his bowel movements are at baseline.The patient denies having any nausea, vomiting, fever, chills, hematochezia, melena, hematemesis, abdominal distention, abdominal pain, diarrhea, jaundice, pruritus .   Notably, he   was recently diagnosed with COVID 10 days ago, was found to have superimposed pneumonia for which he is on doxycycline. Lost 6 lb since he had COVID, has lost some of his taste and smell sense.  He is supposed to get Avsola on 9/22/202 - had to be moved due to the COVID and pneumonia. 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   Most recent labs from 07/23/2021 showed a CMP with creatinine of 1.40, BUN 24, normal electrolytes, AST 20, ALT 14, total bilirubin 0.4, alkaline phosphatase 73.  He had a CBC on 09/30/2021 that showed a white blood cell count of 4.9, hemoglobin of 12.6 and platelets of 218.  Last flex sig: 09/30/2020 - Rectal mucosa is normal. Suture material noted next to  ileorectal anastomosis. - Ileorectal anastomotic stricture. Stricture dilated with balloon dilated to 13.5 mm.. - Neoterminal ileal mucosa normal.. - No specimens collected. Comment: Ulcerative proctitis is completely healed.  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   HAP (hospital-acquired pneumonia) 04/21/2012   Hemorrhoids    History of acute renal failure    post-op  total colectomy 02-27-2012--  required dialysis--  resolved   History of ARDS    02-27-2012  post op total colectomy -- septic shock-  required intubation/ventilator for 3-4 wks   History of atrial fibrillation    02/ 2014--  no issues since   History of Clostridium difficile colitis    02-27-2012--  fulminant C.Diff colitis caused toxic megacolon--  s/p  total colectomy   History of DVT (deep vein thrombosis)    2014--  right upper extremity   History of kidney stones    History of pleural effusion    acute- bilateral in setting septic shock post op Feb 2014   History of septic shock dx's at this time--  ARDS, ARF, CVA, A-Fib   Feb 2014--  post op total colectomy from toxic megacolon/ fulminant C.Diff Colitis   History of stroke March 2014 --  residual right weaker than prior to cva, unable to make fist   Bilateral cerebral and right cerebellar infarcts -  felt to be embolic vs hypotensive (in setting of septic shock, transient a-fib, ARDS)   PAF (paroxysmal atrial fibrillation) ([place]) 03/15/2012   Rectal bleed    Renal calculi    bilateral--  left > right   Ulcerative colitis followed by dr Laural Golden   Distal UC dx 2004   Urgency of urination     Past Surgical History: Past Surgical History:  Procedure Laterality Date   APPLICATION OF WOUND VAC N/A  03/26/2012   Procedure: APPLICATION OF WOUND VAC;  Surgeon: Edward Jolly, MD;  Location: [place];  Service: General;  Laterality: N/A;   BALLOON DILATION N/A 01/15/2018   Procedure: [place];  Surgeon: Rogene Houston, MD;  [facility];  Service: Endoscopy;  Laterality: N/A;  ileorectum   BALLOON DILATION N/A 09/30/2020   Procedure: BALLOON DILATION;  Surgeon: Rogene Houston, MD;  [facility];  Service: Endoscopy;  Laterality: N/A;  colon   BIOPSY  01/19/2017   Procedure: BIOPSY;  Surgeon: Rogene Houston, MD;  [facility];  Service: Endoscopy;;  rectal   CECOSTOMY  02/27/2012   Procedure: CECOSTOMY;  Surgeon: Donato Heinz, MD;  [facility];  Service: General;  Laterality: N/A;  Cecostomy Tube Placement   COLECTOMY  02/28/2012   Procedure: TOTAL COLECTOMY;  Surgeon: Donato Heinz, MD;  [facility];  Service: General;  Laterality: N/A;   COLONOSCOPY  02/16/2011   Procedure: COLONOSCOPY;  Surgeon: Rogene Houston, MD;  [facility];  Service: Endoscopy;  Laterality: N/A;  100   CYSTOSCOPY W/ URETERAL STENT PLACEMENT Bilateral 03/20/2012   Procedure: CYSTOSCOPY WITH RETROGRADE PYELOGRAM/;  Surgeon: Alexis Frock, MD;  Location: [facility];  Service: Urology;  Laterality: Bilateral. Pt reports no stents were placed.   CYSTOSCOPY WITH RETROGRADE PYELOGRAM, URETEROSCOPY AND STENT PLACEMENT Left 07/16/2014   Procedure: CYSTOSCOPY WITH RETROGRADE PYELOGRAM,  AND LEFT STENT PLACEMENT;  Surgeon: Alexis Frock, MD;  [facility];  Service: Urology;  Laterality: Left;   CYSTOSCOPY WITH RETROGRADE PYELOGRAM, URETEROSCOPY AND STENT PLACEMENT Bilateral 07/25/2014   Procedure: CYSTOSCOPY WITH RETROGRADE PYELOGRAM, URETEROSCOPY AND BILATERAL STENT PLACEMENT;  Surgeon: Alexis Frock, MD;  [facility];  Service: Urology;  Laterality: Bilateral;   ESOPHAGOGASTRODUODENOSCOPY N/A 01/14/2016   Procedure:  ESOPHAGOGASTRODUODENOSCOPY (EGD);  Surgeon: Rogene Houston, MD;  [facility];  Service: Endoscopy;  Laterality: N/A;   FLEXIBLE SIGMOIDOSCOPY  02/27/2012   Procedure: FLEXIBLE SIGMOIDOSCOPY;  Surgeon: Rogene Houston, MD;  [facility];  Service: Endoscopy;  Laterality: N/A;  with colonic decompression   FLEXIBLE SIGMOIDOSCOPY N/A 02/06/2013   Procedure: FLEXIBLE SIGMOIDOSCOPY;  Surgeon: Rogene Houston, MD;  [facility];  Service: Endoscopy;  Laterality: N/A;  100-moved to 1200 Ann notified pt   FLEXIBLE SIGMOIDOSCOPY N/A 03/27/2013   Procedure: FLEXIBLE SIGMOIDOSCOPY;  Surgeon: Rogene Houston, MD;  [facility];  Service: Endoscopy;  Laterality: N/A;  [place] N/A 12/11/2013   Procedure: FLEXIBLE SIGMOIDOSCOPY;  Surgeon: Rogene Houston, MD;  [facility];  Service: Endoscopy;  Laterality: N/A;  200   FLEXIBLE SIGMOIDOSCOPY N/A 01/07/2015   Procedure: FLEXIBLE SIGMOIDOSCOPY;  Surgeon: Rogene Houston, MD;  [facility];  Service: Endoscopy;  Laterality: N/A;  [place] N/A 01/14/2016   Procedure: FLEXIBLE SIGMOIDOSCOPY;  Surgeon: Rogene Houston, MD;  [facility];  Service: Endoscopy;  Laterality: N/A;   FLEXIBLE SIGMOIDOSCOPY N/A 01/19/2017   Procedure: FLEXIBLE SIGMOIDOSCOPY;  Surgeon: Rogene Houston, MD;  [facility];  Service: Endoscopy;  Laterality: N/A;  [place] N/A 01/15/2018   Procedure: FLEXIBLE SIGMOIDOSCOPY;  Surgeon: Rogene Houston, MD;  [facility];  Service: Endoscopy;  Laterality: N/A;  9:30   FLEXIBLE SIGMOIDOSCOPY N/A 10/04/2019   Procedure: FLEXIBLE SIGMOIDOSCOPY;  Surgeon: Rogene Houston, MD;  [facility];  Service: Endoscopy;  Laterality: N/A;  [place] N/A 09/30/2020   Procedure: FLEXIBLE SIGMOIDOSCOPY;  Surgeon: Rogene Houston, MD;  [facility];  Service: Endoscopy;   Laterality: N/A;  10:05   HOLMIUM LASER APPLICATION Left 07/31/2421   Procedure: HOLMIUM LASER APPLICATION, LEFT ONLY ;  Surgeon: Alexis Frock, MD;  [facility];  Service: Urology;  Laterality: Left;   ILEOSTOMY  02/28/2012   Procedure: ILEOSTOMY;  Surgeon: Donato Heinz, MD;  [facility];  Service: General;  Laterality: N/A;   ILEOSTOMY CLOSURE N/A 05/27/2013   Procedure: ILEOSTOMY TAKEDOWN WITH ILEOPROCTOSTOMY;  Surgeon: Edward Jolly, MD;  [facility];  Service: General;  Laterality: N/A;   LAPAROTOMY  02/27/2012   Procedure: EXPLORATORY LAPAROTOMY;  Surgeon: Donato Heinz, MD;  [facility];  Service: General;  Laterality: N/A;   LAPAROTOMY N/A 03/26/2012   Procedure: EXPLORATORY LAPAROTOMY;  Surgeon: Edward Jolly, MD;  Location: [place];  Service: General;  Laterality: N/A;   LAPAROTOMY N/A 03/29/2012   Procedure: EXPLORATORY LAPAROTOMY, PARTIAL WOUND CLOSURE;  Surgeon: Edward Jolly, MD;  Location: [place];  Service: General;  Laterality: N/A;   LIVER BIOPSY N/A 03/26/2012   Procedure: LIVER BIOPSY;  Surgeon: Edward Jolly, MD;  Location: [place];  Service: General;  Laterality: N/A;   POLYPECTOMY  10/04/2019   Procedure: POLYPECTOMY;  Surgeon: Rogene Houston, MD;  [facility];  Service: Endoscopy;;   SIGMOID COLECTOMY /  LOW ANTERIOR RESECTION/  END SIGMOID COLOSTOMY  11-07-2010   [place]   Sigmoid colonic perferation---  TAKE DOWN COLOSTOMY,  Jan 2013   TRANSTHORACIC ECHOCARDIOGRAM  03-26-2012  (in setting of septic shock)   report states  poorly visualized--  normal LVSF, ef 50-55% and no pericardial effusion   VACUUM ASSISTED CLOSURE CHANGE N/A 03/29/2012   Procedure: Open ABDOMINAL VACUUM  CHANGE;  Surgeon: Edward Jolly, MD;  Location: [facility];  Service: General;  Laterality: N/A;   VACUUM ASSISTED CLOSURE CHANGE N/A 04/01/2012   Procedure: removal of abdominal vac dressing and abdominal closure;  Surgeon:  Edward Jolly, MD;  [facility];  Service: General;  Laterality: N/A;    Family History: Family History  Problem Relation Age of Onset   Cancer Sister    Healthy Daughter    Hypertension Mother    Colon cancer Neg Hx     Social History: Social History   Tobacco Use  Smoking Status Former   Packs/day: 0.50   Years: 12.00   Total pack years: 6.00   Types: Cigarettes   Quit date: 07/17/1984   Years since quitting: 37.2   Passive exposure: Never  Smokeless Tobacco Never   Social History   Substance and Sexual Activity  Alcohol Use No   Alcohol/week: 0.0 standard drinks of alcohol   Social History   Substance and Sexual Activity  Drug Use No    Allergies: Allergies  Allergen Reactions  Penicillins Other (See Comments)    "Heart rate slowed down"  bradycardia DID THE REACTION INVOLVE: Swelling of the face/tongue/throat, SOB, or low BP? Yes Sudden or severe rash/hives, skin peeling, or the inside of the mouth or nose? No Did it require medical treatment? Yes When did it last happen?      years  If all above answers are "NO", may proceed with cephalosporin use.     Clindamycin/Lincomycin Diarrhea    C-diff   Morphine And Related Nausea And Vomiting   Claritin [Loratadine] Rash   Contrast Media [Iodinated Contrast Media] Hives    Patient reports hives after IV contrast.    Primaxin [Imipenem] Rash    Medications: Current Outpatient Medications  Medication Sig Dispense Refill   acetaminophen (TYLENOL) 500 MG tablet Take 1,000 mg by mouth every 6 (six) hours as needed for moderate pain or headache.      Calcium Citrate (CITRACAL PO) Take by mouth.     diphenhydrAMINE (BENADRYL) 25 MG tablet Take 25 mg by mouth daily as needed. Take before Avsola infusion     doxycycline (VIBRA-TABS) 100 MG tablet Take 100 mg by mouth 2 (two) times daily.     ferrous sulfate 325 (65 FE) MG tablet Take 325 mg by mouth 2 (two) times daily with a meal.     fluticasone  (FLONASE) 50 MCG/ACT nasal spray Place 1 spray into both nostrils daily as needed for allergies or rhinitis.     inFLIXimab-axxq (AVSOLA IV) Inject into the vein. 23m/kg Infusion every 8 weeks     mercaptopurine (PURINETHOL) 50 MG tablet Take 1 tablet (50 mg total) by mouth daily. Give on an empty stomach 1 hour before or 2 hours after meals. Caution: Chemotherapy. 90 tablet 1   mesalamine (CANASA) 1000 MG suppository UNWRAP AND INSERT 1  SUPPOSITORY RECTALLY AT  BEDTIME 90 suppository 3   Multiple Vitamin (MULTIVITAMIN WITH MINERALS) TABS tablet Take 1 tablet by mouth daily.     vitamin C (ASCORBIC ACID) 500 MG tablet Take 500 mg by mouth daily.     No current facility-administered medications for this visit.    Review of Systems: GENERAL: negative for malaise, night sweats HEENT: No changes in hearing or vision, no nose bleeds or other nasal problems. NECK: Negative for lumps, goiter, pain and significant neck swelling RESPIRATORY: Negative for cough, wheezing CARDIOVASCULAR: Negative for chest pain, leg swelling, palpitations, orthopnea GI: SEE HPI MUSCULOSKELETAL: Negative for joint pain or swelling, back pain, and muscle pain. SKIN: Negative for lesions, rash PSYCH: Negative for sleep disturbance, mood disorder and recent psychosocial stressors. HEMATOLOGY Negative for prolonged bleeding, bruising easily, and swollen nodes. ENDOCRINE: Negative for cold or heat intolerance, polyuria, polydipsia and goiter. NEURO: negative for tremor, gait imbalance, syncope and seizures. The remainder of the review of systems is noncontributory.   Physical Exam: BP 113/71 (BP Location: Left Arm, Patient Position: Sitting, Cuff Size: Large)   Pulse 67   Temp 98.2 F (36.8 C) (Oral)   Ht 5' 9"  (1.753 m)   Wt 177 lb 8 oz (80.5 kg)   BMI 26.21 kg/m  GENERAL: The patient is AO x3, in no acute distress. HEENT: Head is normocephalic and atraumatic. EOMI are intact. Mouth is well hydrated and without  lesions. NECK: Supple. No masses LUNGS: Clear to auscultation. No presence of rhonchi/wheezing/rales. Adequate chest expansion HEART: RRR, normal s1 and s2. ABDOMEN: Soft, nontender, no guarding, no peritoneal signs, and nondistended. BS +. No masses. Has mid abdominal scars.  EXTREMITIES: Without any cyanosis, clubbing, rash, lesions or edema. NEUROLOGIC: AOx3, no focal motor deficit. SKIN: no jaundice, no rashes  Imaging/Labs: as above  I personally reviewed and interpreted the available labs, imaging and endoscopic files.  Impression and Plan: Connor Small is a 61 y.o. 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  Patient has presented adequate control of his symptoms while on combination of infliximab and 6-MP and has remained on clinical remission.  His stool output is at baseline.  Due to this, we will continue with Avsola every 8 weeks and low-dose 6-MP which will be refilled today.  He is up-to-date in terms of his blood work-up.  He will need to have a repeat flexible sigmoidoscopy in 2 years for surveillance.  - Continue Avsola every 8 weeks - Continue 6-MP 50 mg qday - Repeat flexible sigmoidoscopy on 09/2022  All questions were answered.      Harvel Quale, MD Gastroenterology and Hepatology [facility]

## 2021-10-07 ENCOUNTER — Ambulatory Visit (INDEPENDENT_AMBULATORY_CARE_PROVIDER_SITE_OTHER): Payer: BC Managed Care – PPO | Admitting: Gastroenterology

## 2021-11-01 ENCOUNTER — Telehealth (INDEPENDENT_AMBULATORY_CARE_PROVIDER_SITE_OTHER): Payer: Self-pay

## 2021-11-01 ENCOUNTER — Other Ambulatory Visit: Payer: Self-pay | Admitting: Gastroenterology

## 2021-11-01 DIAGNOSIS — K51219 Ulcerative (chronic) proctitis with unspecified complications: Secondary | ICD-10-CM

## 2021-11-01 MED ORDER — MESALAMINE 1000 MG RE SUPP: 1000.0000 mg | Freq: Every day | RECTAL | 0 refills | Status: DC

## 2021-11-01 NOTE — Telephone Encounter (Signed)
Patient made aware.

## 2021-11-01 NOTE — Telephone Encounter (Signed)
Ok, sent

## 2021-11-01 NOTE — Telephone Encounter (Signed)
Per patient he is not having any current issues. He says he like to keep them on hand incase he needs them.

## 2021-11-01 NOTE — Telephone Encounter (Signed)
Patient came by the office and wants his Mesalamine 1000 mg suppositories sent to [facility], Not [facility]. Last seen 10/04/2021. Please advise

## 2021-11-01 NOTE — Telephone Encounter (Signed)
Is he having any symptoms at the moment or why is he taking the suppositories?

## 2021-11-11 ENCOUNTER — Ambulatory Visit (INDEPENDENT_AMBULATORY_CARE_PROVIDER_SITE_OTHER): Payer: BC Managed Care – PPO | Admitting: Gastroenterology

## 2022-04-04 ENCOUNTER — Ambulatory Visit (INDEPENDENT_AMBULATORY_CARE_PROVIDER_SITE_OTHER): Payer: BC Managed Care – PPO | Admitting: Gastroenterology

## 2022-04-04 ENCOUNTER — Encounter (INDEPENDENT_AMBULATORY_CARE_PROVIDER_SITE_OTHER): Payer: Self-pay | Admitting: Gastroenterology

## 2022-04-04 VITALS — BP 112/73 | HR 63 | Temp 98.1°F | Ht 69.0 in | Wt 183.1 lb

## 2022-04-04 DIAGNOSIS — Z111 Encounter for screening for respiratory tuberculosis: Secondary | ICD-10-CM

## 2022-04-04 DIAGNOSIS — K51219 Ulcerative (chronic) proctitis with unspecified complications: Secondary | ICD-10-CM

## 2022-04-04 DIAGNOSIS — Z1159 Encounter for screening for other viral diseases: Secondary | ICD-10-CM

## 2022-04-04 NOTE — Progress Notes (Signed)
Connor Small, M.D. Gastroenterology & Hepatology [facility] [address] [place], [place] [address]  Primary Care Physician: Chesley Noon, MD [place] [id number]  I will communicate my assessment and recommendations to the referring MD via EMR.  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  The patient was last seen on 10/04/21. At that time, the patient was advised to continue Avsola every 8 weeks and low dose 6-MP 50 mg qday.  Patient reports feeling fairly well. Very occasionally notices blood when wiping, but this is infrequent. He is having 5-7 Bms per day. Consistency depends on what he eats, can vary between hard to soft.  Has been using mesalamine suppository every night as well. He believes this helps to have more formed stools.  The patient denies having any nausea, vomiting, fever, chills, hematochezia, melena, hematemesis, abdominal distention, abdominal pain, diarrhea, jaundice, pruritus or weight loss.  Notably , he has been on 6-MP since late 2021.  Last flex sig: 09/30/2020 - Rectal mucosa is normal. Suture material noted next to ileorectal anastomosis. - Ileorectal anastomotic stricture. Stricture dilated with balloon dilated to 13.5 mm.. - Neoterminal ileal mucosa normal.. - No specimens collected. Comment: Ulcerative proctitis is completely healed.  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   HAP (hospital-acquired pneumonia) 04/21/2012   Hemorrhoids    History of acute renal failure    post-op  total colectomy 02-27-2012--  required dialysis--  resolved   History of ARDS    02-27-2012  post op total colectomy -- septic shock-  required intubation/ventilator for 3-4 wks   History of atrial fibrillation    02/ 2014--  no issues since   History of Clostridium difficile colitis    02-27-2012--  fulminant C.Diff colitis caused toxic megacolon--  s/p  total colectomy   History of DVT (deep vein thrombosis)    2014--  right upper extremity   History of kidney stones    History of pleural effusion    acute- bilateral in setting septic shock post op Feb 2014   History of septic shock dx's at this time--  ARDS, ARF, CVA, A-Fib   Feb 2014--  post op total colectomy from toxic megacolon/ fulminant C.Diff Colitis   History of stroke March 2014 --  residual right weaker than prior to cva, unable to make fist   Bilateral cerebral and right cerebellar infarcts - felt to be embolic vs hypotensive (in setting of septic shock, transient a-fib, ARDS)   PAF (paroxysmal atrial fibrillation) ([place]) 03/15/2012   Rectal bleed    Renal calculi    bilateral--  left > right   Ulcerative colitis followed by dr Laural Golden   Distal UC dx 2004   Urgency of urination     Past Surgical History: Past Surgical History:  Procedure Laterality Date   APPLICATION OF WOUND VAC N/A 03/26/2012   Procedure: APPLICATION OF WOUND VAC;  Surgeon: Edward Jolly, MD;  Location: [place];  Service: General;  Laterality: N/A;   BALLOON DILATION N/A 01/15/2018   Procedure: [place];  Surgeon: Rogene Houston, MD;  [facility];  Service: Endoscopy;  Laterality: N/A;  ileorectum   BALLOON DILATION N/A 09/30/2020   Procedure: BALLOON DILATION;  Surgeon: Rogene Houston, MD;  [facility];  Service:  Endoscopy;  Laterality: N/A;  colon   BIOPSY  01/19/2017   Procedure: BIOPSY;  Surgeon: Rogene Houston, MD;  [facility];  Service: Endoscopy;;  rectal   CECOSTOMY  02/27/2012   Procedure: CECOSTOMY;  Surgeon: Donato Heinz, MD;  [facility];  Service: General;  Laterality: N/A;  Cecostomy Tube Placement   COLECTOMY  02/28/2012   Procedure: TOTAL COLECTOMY;  Surgeon: Donato Heinz, MD;  [facility];  Service: General;  Laterality: N/A;   COLONOSCOPY  02/16/2011   Procedure: COLONOSCOPY;  Surgeon: Rogene Houston, MD;  [facility];  Service: Endoscopy;  Laterality: N/A;  100   CYSTOSCOPY W/ URETERAL STENT PLACEMENT Bilateral 03/20/2012   Procedure: CYSTOSCOPY WITH RETROGRADE PYELOGRAM/;  Surgeon: Alexis Frock, MD;  Location: [place];  Service: Urology;  Laterality: Bilateral. Pt reports no stents were placed.   CYSTOSCOPY WITH RETROGRADE PYELOGRAM, URETEROSCOPY AND STENT PLACEMENT Left 07/16/2014   Procedure: CYSTOSCOPY WITH RETROGRADE PYELOGRAM,  AND LEFT STENT PLACEMENT;  Surgeon: Alexis Frock, MD;  [facility];  Service: Urology;  Laterality: Left;   CYSTOSCOPY WITH RETROGRADE PYELOGRAM, URETEROSCOPY AND STENT PLACEMENT Bilateral 07/25/2014   Procedure: CYSTOSCOPY WITH RETROGRADE PYELOGRAM, URETEROSCOPY AND BILATERAL STENT PLACEMENT;  Surgeon: Alexis Frock, MD;  [facility];  Service: Urology;  Laterality: Bilateral;   ESOPHAGOGASTRODUODENOSCOPY N/A 01/14/2016   Procedure: ESOPHAGOGASTRODUODENOSCOPY (EGD);  Surgeon: Rogene Houston, MD;  [facility];  Service: Endoscopy;  Laterality: N/A;   FLEXIBLE SIGMOIDOSCOPY  02/27/2012   Procedure: FLEXIBLE SIGMOIDOSCOPY;  Surgeon: Rogene Houston, MD;  [facility];  Service: Endoscopy;  Laterality: N/A;  with colonic decompression   FLEXIBLE SIGMOIDOSCOPY N/A 02/06/2013   Procedure: FLEXIBLE SIGMOIDOSCOPY;  Surgeon: Rogene Houston, MD;  [facility];   Service: Endoscopy;  Laterality: N/A;  100-moved to 1200 Ann notified pt   FLEXIBLE SIGMOIDOSCOPY N/A 03/27/2013   Procedure: FLEXIBLE SIGMOIDOSCOPY;  Surgeon: Rogene Houston, MD;  [facility];  Service: Endoscopy;  Laterality: N/A;  [place] N/A 12/11/2013   Procedure: FLEXIBLE SIGMOIDOSCOPY;  Surgeon: Rogene Houston, MD;  [facility];  Service: Endoscopy;  Laterality: N/A;  200   FLEXIBLE SIGMOIDOSCOPY N/A 01/07/2015   Procedure: FLEXIBLE SIGMOIDOSCOPY;  Surgeon: Rogene Houston, MD;  [facility];  Service: Endoscopy;  Laterality: N/A;  [place] N/A 01/14/2016   Procedure: FLEXIBLE SIGMOIDOSCOPY;  Surgeon: Rogene Houston, MD;  [facility];  Service: Endoscopy;  Laterality: N/A;   FLEXIBLE SIGMOIDOSCOPY N/A 01/19/2017   Procedure: FLEXIBLE SIGMOIDOSCOPY;  Surgeon: Rogene Houston, MD;  [facility];  Service: Endoscopy;  Laterality: N/A;  [place] N/A 01/15/2018   Procedure: FLEXIBLE SIGMOIDOSCOPY;  Surgeon: Rogene Houston, MD;  [facility];  Service: Endoscopy;  Laterality: N/A;  9:30   FLEXIBLE SIGMOIDOSCOPY N/A 10/04/2019   Procedure: FLEXIBLE SIGMOIDOSCOPY;  Surgeon: Rogene Houston, MD;  [facility];  Service: Endoscopy;  Laterality: N/A;  [place] N/A 09/30/2020   Procedure: FLEXIBLE SIGMOIDOSCOPY;  Surgeon: Rogene Houston, MD;  [facility];  Service: Endoscopy;  Laterality: N/A;  10:05   HOLMIUM LASER APPLICATION Left [id number]   Procedure: HOLMIUM LASER APPLICATION, LEFT ONLY ;  Surgeon: Alexis Frock, MD;  [facility];  Service: Urology;  Laterality: Left;   ILEOSTOMY  02/28/2012   Procedure: ILEOSTOMY;  Surgeon: Donato Heinz, MD;  [facility];  Service: General;  Laterality: N/A;   ILEOSTOMY CLOSURE N/A 05/27/2013   Procedure: ILEOSTOMY TAKEDOWN WITH ILEOPROCTOSTOMY;  Surgeon:  Edward Jolly, MD;  [facility];  Service: General;  Laterality: N/A;   LAPAROTOMY  02/27/2012   Procedure: EXPLORATORY LAPAROTOMY;  Surgeon: Donato Heinz, MD;  [facility];  Service: General;  Laterality: N/A;   LAPAROTOMY N/A 03/26/2012   Procedure: EXPLORATORY LAPAROTOMY;  Surgeon: Edward Jolly, MD;  Location: [place];  Service: General;  Laterality: N/A;   LAPAROTOMY N/A 03/29/2012   Procedure: EXPLORATORY LAPAROTOMY, PARTIAL WOUND CLOSURE;  Surgeon: Edward Jolly, MD;  Location: [place];  Service: General;  Laterality: N/A;   LIVER BIOPSY N/A 03/26/2012   Procedure: LIVER BIOPSY;  Surgeon: Edward Jolly, MD;  Location: [facility] [place];  Service: General;  Laterality: N/A;   POLYPECTOMY  10/04/2019   Procedure: POLYPECTOMY;  Surgeon: Rogene Houston, MD;  [facility];  Service: Endoscopy;;   SIGMOID COLECTOMY /  LOW ANTERIOR RESECTION/  END SIGMOID COLOSTOMY  11-07-2010   [place]   Sigmoid colonic perferation---  TAKE DOWN COLOSTOMY,  Jan 2013   TRANSTHORACIC ECHOCARDIOGRAM  03-26-2012  (in setting of septic shock)   report states  poorly visualized--  normal LVSF, ef 50-55% and no pericardial effusion   VACUUM ASSISTED CLOSURE CHANGE N/A 03/29/2012   Procedure: Open ABDOMINAL VACUUM  CHANGE;  Surgeon: Edward Jolly, MD;  Location: [place];  Service: General;  Laterality: N/A;   VACUUM ASSISTED CLOSURE CHANGE N/A 04/01/2012   Procedure: removal of abdominal vac dressing and abdominal closure;  Surgeon: Edward Jolly, MD;  [facility];  Service: General;  Laterality: N/A;    Family History: Family History  Problem Relation Age of Onset   Cancer Sister    Healthy Daughter    Hypertension Mother    Colon cancer Neg Hx     Social History: Social History   Tobacco Use  Smoking Status Former   Packs/day: 0.50   Years: 12.00   Total pack years: 6.00   Types: Cigarettes   Quit date: 07/17/1984   Years since quitting: 37.7   Passive  exposure: Never  Smokeless Tobacco Never   Social History   Substance and Sexual Activity  Alcohol Use No   Alcohol/week: 0.0 standard drinks of alcohol   Social History   Substance and Sexual Activity  Drug Use No    Allergies: Allergies  Allergen Reactions   Penicillins Other (See Comments)    "Heart rate slowed down"  bradycardia DID THE REACTION INVOLVE: Swelling of the face/tongue/throat, SOB, or low BP? Yes Sudden or severe rash/hives, skin peeling, or the inside of the mouth or nose? No Did it require medical treatment? Yes When did it last happen?      years  If all above answers are "NO", may proceed with cephalosporin use.     Clindamycin/Lincomycin Diarrhea    C-diff   Morphine And Related Nausea  And Vomiting   Claritin [Loratadine] Rash   Contrast Media [Iodinated Contrast Media] Hives    Patient reports hives after IV contrast.    Primaxin [Imipenem] Rash    Medications: Current Outpatient Medications  Medication Sig Dispense Refill   acetaminophen (TYLENOL) 500 MG tablet Take 1,000 mg by mouth every 6 (six) hours as needed for moderate pain or headache.      Calcium Citrate (CITRACAL PO) Take by mouth.     diphenhydrAMINE (BENADRYL) 25 MG tablet Take 25 mg by mouth daily as needed. Take before Avsola infusion     ferrous sulfate 325 (65 FE) MG tablet Take 325 mg by mouth 2 (two) times daily with a meal.     fluticasone (FLONASE) 50 MCG/ACT nasal spray Place 1 spray into both nostrils daily as needed for allergies or rhinitis.     inFLIXimab-axxq (AVSOLA IV) Inject into the vein. '5mg'$ /kg Infusion every 8 weeks     mercaptopurine (PURINETHOL) 50 MG tablet Take 1 tablet (50 mg total) by mouth daily. Give on an empty stomach 1 hour before or 2 hours after meals. Caution: Chemotherapy. 180 tablet 1   mesalamine (CANASA) 1000 MG suppository Place 1 suppository (1,000 mg total) rectally at bedtime. 30 suppository 0   Multiple Vitamin (MULTIVITAMIN WITH MINERALS)  TABS tablet Take 1 tablet by mouth daily.     vitamin C (ASCORBIC ACID) 500 MG tablet Take 500 mg by mouth daily.     No current facility-administered medications for this visit.    Review of Systems: GENERAL: negative for malaise, night sweats HEENT: No changes in hearing or vision, no nose bleeds or other nasal problems. NECK: Negative for lumps, goiter, pain and significant neck swelling RESPIRATORY: Negative for cough, wheezing CARDIOVASCULAR: Negative for chest pain, leg swelling, palpitations, orthopnea GI: SEE HPI MUSCULOSKELETAL: Negative for joint pain or swelling, back pain, and muscle pain. SKIN: Negative for lesions, rash PSYCH: Negative for sleep disturbance, mood disorder and recent psychosocial stressors. HEMATOLOGY Negative for prolonged bleeding, bruising easily, and swollen nodes. ENDOCRINE: Negative for cold or heat intolerance, polyuria, polydipsia and goiter. NEURO: negative for tremor, gait imbalance, syncope and seizures. The remainder of the review of systems is noncontributory.   Physical Exam: BP 112/73 (BP Location: Left Arm, Patient Position: Sitting, Cuff Size: Large)   Pulse 63   Temp 98.1 F (36.7 C) (Oral)   Ht '5\' 9"'$  (1.753 m)   Wt 183 lb 1.6 oz (83.1 kg)   BMI 27.04 kg/m  GENERAL: The patient is AO x3, in no acute distress. HEENT: Head is normocephalic and atraumatic. EOMI are intact. Mouth is well hydrated and without lesions. NECK: Supple. No masses LUNGS: Clear to auscultation. No presence of rhonchi/wheezing/rales. Adequate chest expansion HEART: RRR, normal s1 and s2. ABDOMEN: Soft, nontender, no guarding, no peritoneal signs, and nondistended. BS +. No masses. EXTREMITIES: Without any cyanosis, clubbing, rash, lesions or edema. NEUROLOGIC: AOx3, no focal motor deficit. SKIN: no jaundice, no rashes  Imaging/Labs: as above  I personally reviewed and interpreted the available labs, imaging and endoscopic files.  Impression and  Plan: Connor Small is a 62 y.o. 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  Patient has presented adequate control of his symptoms.  Last flexible sigmoidoscopy he had presence of an anastomotic stricture without presence of active inflammation endoscopically.  It  does seem that he has responded to combination therapy with infliximab and 6-MP at low-dose.  We discussed about the potential long-term risks with combination therapy after 2 years of treatment.  Due to concern for lymphoproliferative disorders, we decided to stop his 6-MP for now.  However, we discussed the importance of infliximab dose optimization.  We will check the infliximab levels day before his next infliximab dose.  At that time we will also check surveillance labs for his ulcerative colitis.  He can continue using mesalamine suppositories as this has provided some relief of his symptoms with minimal side effects.  -Continue Avsola every 8 weeks -Stop 6-MP -Continue mesalamine suppositories as needed -Check CBC, CMP, CRP, QuantiFERON, hepatitis B, and infliximab levels the day before next Avsola dose  -Will discuss repeat flexible sigmoidoscopy in follow-up appointment in September 2024  All questions were answered.      Connor Peppers, MD Gastroenterology and Hepatology [facility]

## 2022-04-04 NOTE — Patient Instructions (Addendum)
Continue Avsola every 8 weeks Stop 6-MP Continue mesalamine suppositories as needed Perform blood workup THE DAY BEFORE YOUR NEXT AVSOLA DOSE Will discuss repeat flexible sigmoidoscopy in September 2024

## 2022-05-25 ENCOUNTER — Other Ambulatory Visit (INDEPENDENT_AMBULATORY_CARE_PROVIDER_SITE_OTHER): Payer: Self-pay

## 2022-05-31 ENCOUNTER — Encounter (INDEPENDENT_AMBULATORY_CARE_PROVIDER_SITE_OTHER): Payer: Self-pay

## 2022-05-31 LAB — CBC WITH DIFFERENTIAL/PLATELET
Absolute Monocytes: 607 cells/uL (ref 200–950)
Basophils Absolute: 20 cells/uL (ref 0–200)
Basophils Relative: 0.4 %
Eosinophils Absolute: 92 cells/uL (ref 15–500)
Eosinophils Relative: 1.8 %
HCT: 39 % (ref 38.5–50.0)
Hemoglobin: 13.3 g/dL (ref 13.2–17.1)
Lymphs Abs: 1525 cells/uL (ref 850–3900)
MCH: 33.9 pg — ABNORMAL HIGH (ref 27.0–33.0)
MCHC: 34.1 g/dL (ref 32.0–36.0)
MCV: 99.5 fL (ref 80.0–100.0)
MPV: 9 fL (ref 7.5–12.5)
Monocytes Relative: 11.9 %
Neutro Abs: 2856 cells/uL (ref 1500–7800)
Neutrophils Relative %: 56 %
Platelets: 156 10*3/uL (ref 140–400)
RBC: 3.92 10*6/uL — ABNORMAL LOW (ref 4.20–5.80)
RDW: 12.5 % (ref 11.0–15.0)
Total Lymphocyte: 29.9 %
WBC: 5.1 10*3/uL (ref 3.8–10.8)

## 2022-05-31 LAB — COMPREHENSIVE METABOLIC PANEL
AG Ratio: 1.4 (calc) (ref 1.0–2.5)
ALT: 16 U/L (ref 9–46)
AST: 21 U/L (ref 10–35)
Albumin: 3.8 g/dL (ref 3.6–5.1)
Alkaline phosphatase (APISO): 71 U/L (ref 35–144)
BUN: 20 mg/dL (ref 7–25)
CO2: 27 mmol/L (ref 20–32)
Calcium: 8.8 mg/dL (ref 8.6–10.3)
Chloride: 106 mmol/L (ref 98–110)
Creat: 1.31 mg/dL (ref 0.70–1.35)
Globulin: 2.7 g/dL (calc) (ref 1.9–3.7)
Glucose, Bld: 100 mg/dL (ref 65–139)
Potassium: 4.7 mmol/L (ref 3.5–5.3)
Sodium: 141 mmol/L (ref 135–146)
Total Bilirubin: 0.4 mg/dL (ref 0.2–1.2)
Total Protein: 6.5 g/dL (ref 6.1–8.1)

## 2022-05-31 LAB — C-REACTIVE PROTEIN: CRP: <3 mg/L (ref <8.0)

## 2022-05-31 LAB — INFLIXIMAB LEVEL AND ADA FOR IBD
Infliximab ADA, IBD: <10 AU (ref <10)
Infliximab Level, IBD: 5.2 ug/mL

## 2022-05-31 LAB — QUANTIFERON-TB GOLD PLUS
Mitogen-NIL: 7.49 IU/mL
NIL: 0.01 IU/mL
QuantiFERON-TB Gold Plus: NEGATIVE
TB1-NIL: 0 IU/mL
TB2-NIL: 0 IU/mL

## 2022-05-31 LAB — HEPATITIS B SURFACE ANTIGEN: Hepatitis B Surface Ag: NONREACTIVE

## 2022-06-01 ENCOUNTER — Encounter (INDEPENDENT_AMBULATORY_CARE_PROVIDER_SITE_OTHER): Payer: Self-pay

## 2022-06-02 ENCOUNTER — Encounter (INDEPENDENT_AMBULATORY_CARE_PROVIDER_SITE_OTHER): Payer: Self-pay

## 2022-10-10 ENCOUNTER — Ambulatory Visit (INDEPENDENT_AMBULATORY_CARE_PROVIDER_SITE_OTHER): Payer: BC Managed Care – PPO | Admitting: Gastroenterology

## 2022-10-10 ENCOUNTER — Encounter (INDEPENDENT_AMBULATORY_CARE_PROVIDER_SITE_OTHER): Payer: Self-pay | Admitting: Gastroenterology

## 2022-10-10 VITALS — BP 139/75 | HR 58 | Temp 97.7°F | Ht 69.0 in | Wt 184.6 lb

## 2022-10-10 DIAGNOSIS — K51219 Ulcerative (chronic) proctitis with unspecified complications: Secondary | ICD-10-CM

## 2022-10-10 NOTE — H&P (View-Only) (Signed)
Katrinka Blazing, M.D. Gastroenterology & Hepatology [facility] G[facility]enter[facility] [address] [place], [place] [address]  Primary Care Physician: Connor Inch, MD [address] [place] [address]  I will communicate my assessment and recommendations to the referring MD via EMR.  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  The patient was last seen on 04/04/2022. At that time, the patient was advised to stop 6-MP and to continue Avsola every 8 weeks.  Patient had blood workup on 05/25/2022 which showed negative hepatitis B surface antigen, QuantiFERON, normal CRP, CBC and CMP.  His infliximab levels were checked and they were below the cutoff (5.2).  He was advised to increase his Avsola to every 6 weeks but he wanted to discuss this first in the GI clinic.  Patient reports that he was feeling better and with more energy when he was on Remicade than with Avsola. He would like to switch to a different infusion cnetner as he has not had a good experience with [facility] recently. He would like to try Remicade again.   Patient reports he is having 6-7 bowel movements without diarrhea. The patient denies having any nausea, vomiting, fever, chills, hematochezia, melena, hematemesis, abdominal distention, abdominal pain, jaundice, pruritus or weight loss.  Last flu shot: 10/2021 Last pneumonia shot:never Last evaluation by dermatology: never Last zoster vaccine: never COVID-19 shot: 2 initial doses - Moderna  Last flex sig: 09/30/2020 - Rectal mucosa is normal. Suture material noted next to ileorectal  anastomosis. - Ileorectal anastomotic stricture. Stricture dilated with balloon dilated to 13.5 mm.. - Neoterminal ileal mucosa normal.. - No specimens collected. Comment: Ulcerative proctitis is completely healed.  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   HAP (hospital-acquired pneumonia) 04/21/2012   Hemorrhoids    History of acute renal failure    post-op  total colectomy 02-27-2012--  required dialysis--  resolved   History of ARDS    02-27-2012  post op total colectomy -- septic shock-  required intubation/ventilator for 3-4 wks   History of atrial fibrillation    02/ 2014--  no issues since   History of Clostridium difficile colitis    02-27-2012--  fulminant C.Diff colitis caused toxic megacolon--  s/p  total colectomy   History of DVT (deep vein thrombosis)    2014--  right upper extremity   History of kidney stones    History of pleural effusion    acute- bilateral in setting septic shock post op Feb 2014   History of septic shock dx's at this time--  ARDS, ARF, CVA, A-Fib   Feb 2014--  post op total colectomy from toxic megacolon/ fulminant C.Diff Colitis   History of stroke March 2014 --  residual right weaker than prior to cva, unable to make fist   Bilateral cerebral and right cerebellar infarcts - felt to be embolic vs hypotensive (in setting of septic shock, transient a-fib, ARDS)  PAF (paroxysmal atrial fibrillation) (HCC) 03/15/2012   Rectal bleed    Renal calculi    bilateral--  left > right   Ulcerative colitis followed by dr Karilyn Cota   Distal UC dx 2004   Urgency of urination     Past Surgical History: Past Surgical History:  Procedure Laterality Date   APPLICATION OF WOUND VAC N/A 03/26/2012    Procedure: APPLICATION OF WOUND VAC;  Surgeon: Mariella Saa, MD;  [facility]: [facility];  Service: General;  Laterality: N/A;   BALLOON DILATION N/A 01/15/2018   Procedure: [place];  Surgeon: Malissa Hippo, MD;  [facility];  Service: Endoscopy;  Laterality: N/A;  ileorectum   BALLOON DILATION N/A 09/30/2020   Procedure: BALLOON DILATION;  Surgeon: Malissa Hippo, MD;  [facility];  Service: Endoscopy;  Laterality: N/A;  colon   BIOPSY  01/19/2017   Procedure: BIOPSY;  Surgeon: Malissa Hippo, MD;  [facility];  Service: Endoscopy;;  rectal   CECOSTOMY  02/27/2012   Procedure: CECOSTOMY;  Surgeon: Fabio Bering, MD;  Location: [facility];  Service: General;  Laterality: N/A;  Cecostomy Tube Placement   COLECTOMY  02/28/2012   Procedure: TOTAL COLECTOMY;  Surgeon: Fabio Bering, MD;  [facility]: [facility];  Service: General;  Laterality: N/A;   COLONOSCOPY  02/16/2011   Procedure: COLONOSCOPY;  Surgeon: Malissa Hippo, MD;  [facility];  Service: Endoscopy;  Laterality: N/A;  100   CYSTOSCOPY W/ URETERAL STENT PLACEMENT Bilateral 03/20/2012   Procedure: CYSTOSCOPY WITH RETROGRADE PYELOGRAM/;  Surgeon: Sebastian Ache, MD;  Location: [facility] OR;  Service: Urology;  Laterality: Bilateral. Pt reports no stents were placed.   CYSTOSCOPY WITH RETROGRADE PYELOGRAM, URETEROSCOPY AND STENT PLACEMENT Left 07/16/2014   Procedure: CYSTOSCOPY WITH RETROGRADE PYELOGRAM,  AND LEFT STENT PLACEMENT;  Surgeon: Sebastian Ache, MD;  Location: [facility];  Service: Urology;  Laterality: Left;   CYSTOSCOPY WITH RETROGRADE PYELOGRAM, URETEROSCOPY AND STENT PLACEMENT Bilateral 07/25/2014   Procedure: CYSTOSCOPY WITH RETROGRADE PYELOGRAM, URETEROSCOPY AND BILATERAL STENT PLACEMENT;  Surgeon: Sebastian Ache, MD;  Location: [facility];  Service: Urology;  Laterality: Bilateral;   ESOPHAGOGASTRODUODENOSCOPY N/A 01/14/2016   Procedure: ESOPHAGOGASTRODUODENOSCOPY (EGD);   Surgeon: Malissa Hippo, MD;  [facility];  Service: Endoscopy;  Laterality: N/A;   FLEXIBLE SIGMOIDOSCOPY  02/27/2012   Procedure: FLEXIBLE SIGMOIDOSCOPY;  Surgeon: Malissa Hippo, MD;  [facility];  Service: Endoscopy;  Laterality: N/A;  with colonic decompression   FLEXIBLE SIGMOIDOSCOPY N/A 02/06/2013   Procedure: FLEXIBLE SIGMOIDOSCOPY;  Surgeon: Malissa Hippo, MD;  [facility];  Service: Endoscopy;  Laterality: N/A;  100-moved to 1200 Ann notified pt   FLEXIBLE SIGMOIDOSCOPY N/A 03/27/2013   Procedure: FLEXIBLE SIGMOIDOSCOPY;  Surgeon: Malissa Hippo, MD;  [facility];  Service: Endoscopy;  Laterality: N/A;  225   FLEXIBLE SIGMOIDOSCOPY N/A 12/11/2013   Procedure: FLEXIBLE SIGMOIDOSCOPY;  Surgeon: Malissa Hippo, MD;  [facility];  Service: Endoscopy;  Laterality: N/A;  200   FLEXIBLE SIGMOIDOSCOPY N/A 01/07/2015   Procedure: FLEXIBLE SIGMOIDOSCOPY;  Surgeon: Malissa Hippo, MD;  [facility];  Service: Endoscopy;  Laterality: N/A;  830   FLEXIBLE SIGMOIDOSCOPY N/A 01/14/2016   Procedure: FLEXIBLE SIGMOIDOSCOPY;  Surgeon: Malissa Hippo, MD;  [facility];  Service: Endoscopy;  Laterality: N/A;   FLEXIBLE SIGMOIDOSCOPY N/A 01/19/2017   Procedure: FLEXIBLE SIGMOIDOSCOPY;  Surgeon: Karilyn Cota,  Joline Maxcy, MD;  [facility];  Service: Endoscopy;  Laterality: N/A;  1200   FLEXIBLE SIGMOIDOSCOPY N/A 01/15/2018   Procedure: FLEXIBLE SIGMOIDOSCOPY;  Surgeon: Malissa Hippo, MD;  [facility];  Service: Endoscopy;  Laterality: N/A;  9:30   FLEXIBLE SIGMOIDOSCOPY N/A 10/04/2019   Procedure: FLEXIBLE SIGMOIDOSCOPY;  Surgeon: Malissa Hippo, MD;  [facility];  Service: Endoscopy;  Laterality: N/A;  730   FLEXIBLE SIGMOIDOSCOPY N/A 09/30/2020   Procedure: FLEXIBLE SIGMOIDOSCOPY;  Surgeon: Malissa Hippo, MD;  [facility];  Service: Endoscopy;  Laterality: N/A;  10:05    HOLMIUM LASER APPLICATION Left 07/25/2014   Procedure: HOLMIUM LASER APPLICATION, LEFT ONLY ;  Surgeon: Sebastian Ache, MD;  Location: [facility];  Service: Urology;  Laterality: Left;   ILEOSTOMY  02/28/2012   Procedure: ILEOSTOMY;  Surgeon: Fabio Bering, MD;  [facility];  Service: General;  Laterality: N/A;   ILEOSTOMY CLOSURE N/A 05/27/2013   Procedure: ILEOSTOMY TAKEDOWN WITH ILEOPROCTOSTOMY;  Surgeon: Mariella Saa, MD;  Location: [facility];  Service: General;  Laterality: N/A;   LAPAROTOMY  02/27/2012   Procedure: EXPLORATORY LAPAROTOMY;  Surgeon: Fabio Bering, MD;  [facility];  Service: General;  Laterality: N/A;   LAPAROTOMY N/A 03/26/2012   Procedure: EXPLORATORY LAPAROTOMY;  Surgeon: Mariella Saa, MD;  [facility];  Service: General;  Laterality: N/A;   LAPAROTOMY N/A 03/29/2012   Procedure: EXPLORATORY LAPAROTOMY, PARTIAL WOUND CLOSURE;  Surgeon: Mariella Saa, MD;  [facility];  Service: General;  Laterality: N/A;   LIVER BIOPSY N/A 03/26/2012   Procedure: LIVER BIOPSY;  Surgeon: Mariella Saa, MD;  [facility];  Service: General;  Laterality: N/A;   POLYPECTOMY  10/04/2019   Procedure: POLYPECTOMY;  Surgeon: Malissa Hippo, MD;  [facility];  Service: Endoscopy;;   SIGMOID COLECTOMY /  LOW ANTERIOR RESECTION/  END SIGMOID COLOSTOMY  11-07-2010   [place]   Sigmoid colonic perferation---  TAKE DOWN COLOSTOMY,  Jan 2013   TRANSTHORACIC ECHOCARDIOGRAM  03-26-2012  (in setting of septic shock)   report states  poorly visualized--  normal LVSF, ef 50-55% and no pericardial effusion   VACUUM ASSISTED CLOSURE CHANGE N/A 03/29/2012   Procedure: Open ABDOMINAL VACUUM  CHANGE;  Surgeon: Mariella Saa, MD;  [facility];  Service: General;  Laterality: N/A;   VACUUM ASSISTED CLOSURE CHANGE N/A 04/01/2012   Procedure: removal of abdominal vac dressing and abdominal closure;  Surgeon: Mariella Saa, MD;  [facility];  Service: General;  Laterality: N/A;    Family History: Family History  Problem Relation Age of Onset   Cancer Sister    Healthy Daughter    Hypertension Mother    Colon cancer Neg Hx     Social History: Social History   Tobacco Use  Smoking Status Former   Current packs/day: 0.00   Average packs/day: 0.5 packs/day for 12.0 years (6.0 ttl pk-yrs)   Types: Cigarettes   Start date: 07/17/1972   Quit date: 07/17/1984   Years since quitting: 38.2   Passive exposure: Never  Smokeless Tobacco Never   Social History   Substance and Sexual Activity  Alcohol Use No   Alcohol/week: 0.0 standard drinks of alcohol   Social History   Substance and Sexual Activity  Drug Use No    Allergies: Allergies  Allergen Reactions   Penicillins Other (See Comments)    "  Heart rate slowed down"  bradycardia DID THE REACTION INVOLVE: Swelling of the face/tongue/throat, SOB, or low BP? Yes Sudden or severe rash/hives, skin peeling, or the inside of the mouth or nose? No Did it require medical treatment? Yes When did it last happen?      years  If all above answers are "NO", may proceed with cephalosporin use.     Clindamycin/Lincomycin Diarrhea    C-diff   Morphine And Codeine Nausea And Vomiting   Claritin [Loratadine] Rash   Contrast Media [Iodinated Contrast Media] Hives    Patient reports hives after IV contrast.    Primaxin [Imipenem] Rash    Medications: Current Outpatient Medications  Medication Sig Dispense Refill   acetaminophen (TYLENOL) 500 MG tablet Take 1,000 mg by mouth every 6 (six) hours as needed for moderate pain or headache.      Calcium Citrate (CITRACAL PO) Take by mouth.     diphenhydrAMINE (BENADRYL) 25 MG tablet Take 25 mg by mouth daily as needed. Take before Avsola infusion     ferrous sulfate 325 (65 FE) MG tablet Take 325 mg by mouth 2 (two) times daily with a meal.     inFLIXimab-axxq (AVSOLA IV) Inject into the vein. 5mg /kg Infusion every 8  weeks     mesalamine (CANASA) 1000 MG suppository Place 1 suppository (1,000 mg total) rectally at bedtime. 30 suppository 0   Multiple Vitamin (MULTIVITAMIN WITH MINERALS) TABS tablet Take 1 tablet by mouth daily.     vitamin C (ASCORBIC ACID) 500 MG tablet Take 500 mg by mouth daily.     fluticasone (FLONASE) 50 MCG/ACT nasal spray Place 1 spray into both nostrils daily as needed for allergies or rhinitis. (Patient not taking: Reported on 10/10/2022)     No current facility-administered medications for this visit.    Review of Systems: GENERAL: negative for malaise, night sweats HEENT: No changes in hearing or vision, no nose bleeds or other nasal problems. NECK: Negative for lumps, goiter, pain and significant neck swelling RESPIRATORY: Negative for cough, wheezing CARDIOVASCULAR: Negative for chest pain, leg swelling, palpitations, orthopnea GI: SEE HPI MUSCULOSKELETAL: Negative for joint pain or swelling, back pain, and muscle pain. SKIN: Negative for lesions, rash PSYCH: Negative for sleep disturbance, mood disorder and recent psychosocial stressors. HEMATOLOGY Negative for prolonged bleeding, bruising easily, and swollen nodes. ENDOCRINE: Negative for cold or heat intolerance, polyuria, polydipsia and goiter. NEURO: negative for tremor, gait imbalance, syncope and seizures. The remainder of the review of systems is noncontributory.   Physical Exam: BP 139/75 (BP Location: Right Arm, Patient Position: Sitting, Cuff Size: Normal)   Pulse (!) 58   Temp 97.7 F (36.5 C) (Temporal)   Ht 5\' 9"  (1.753 m)   Wt 184 lb 9.6 oz (83.7 kg)   BMI 27.26 kg/m  GENERAL: The patient is AO x3, in no acute distress. HEENT: Head is normocephalic and atraumatic. EOMI are intact. Mouth is well hydrated and without lesions. NECK: Supple. No masses LUNGS: Clear to auscultation. No presence of rhonchi/wheezing/rales. Adequate chest expansion HEART: RRR, normal s1 and s2. ABDOMEN: Soft, nontender,  no guarding, no peritoneal signs, and nondistended. BS +. No masses. RECTAL EXAM: no external lesions, normal tone, no masses, brown stool without blood.*** Chaperone: EXTREMITIES: Without any cyanosis, clubbing, rash, lesions or edema. NEUROLOGIC: AOx3, no focal motor deficit. SKIN: no jaundice, no rashes  Imaging/Labs: as above  I personally reviewed and interpreted the available labs, imaging and endoscopic files.  Impression and Plan: Bebe Shaggy  Krikorian is a 62 y.o. male coming for follow up of ***   All questions were answered.      Katrinka Blazing, MD Gastroenterology and Hepatology [facility]

## 2022-10-10 NOTE — Progress Notes (Unsigned)
Katrinka Blazing, M.D. Gastroenterology & Hepatology [facility] Gastroenterology [address] [place], [place] [address]  Primary Care Physician: Eartha Inch, MD [address] [place] [address]  I will communicate my assessment and recommendations to the referring MD via EMR.  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  The patient was last seen on 04/04/2022. At that time, the patient was advised to stop 6-MP and to continue Avsola every 8 weeks.  Patient had blood workup on 05/25/2022 which showed negative hepatitis B surface antigen, QuantiFERON, normal CRP, CBC and CMP.  His infliximab levels were checked and they were below the cutoff (5.2).  He was advised to increase his Avsola to every 6 weeks but he wanted to discuss this first in the GI clinic.  Patient reports that he was feeling better and with more energy when he was on Remicade than with Avsola. He would like to switch to a different infusion cnetner as he has not had a good experience with [facility] recently. He would like to try Remicade again.   Patient reports he is having 6-7 bowel movements without diarrhea. The patient denies having any nausea, vomiting, fever, chills, hematochezia, melena, hematemesis, abdominal distention, abdominal pain, jaundice, pruritus or weight loss.  Last flu shot: 10/2021 Last pneumonia shot:never Last evaluation by dermatology: never Last zoster vaccine: never COVID-19 shot: 2 initial doses - Moderna  Last flex sig: 09/30/2020 - Rectal mucosa is normal. Suture material noted next to ileorectal  anastomosis. - Ileorectal anastomotic stricture. Stricture dilated with balloon dilated to 13.5 mm.. - Neoterminal ileal mucosa normal.. - No specimens collected. Comment: Ulcerative proctitis is completely healed.  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   HAP (hospital-acquired pneumonia) 04/21/2012   Hemorrhoids    History of acute renal failure    post-op  total colectomy 02-27-2012--  required dialysis--  resolved   History of ARDS    02-27-2012  post op total colectomy -- septic shock-  required intubation/ventilator for 3-4 wks   History of atrial fibrillation    02/ 2014--  no issues since   History of Clostridium difficile colitis    02-27-2012--  fulminant C.Diff colitis caused toxic megacolon--  s/p  total colectomy   History of DVT (deep vein thrombosis)    2014--  right upper extremity   History of kidney stones    History of pleural effusion    acute- bilateral in setting septic shock post op Feb 2014   History of septic shock dx's at this time--  ARDS, ARF, CVA, A-Fib   Feb 2014--  post op total colectomy from toxic megacolon/ fulminant C.Diff Colitis   History of stroke March 2014 --  residual right weaker than prior to cva, unable to make fist   Bilateral cerebral and right cerebellar infarcts - felt to be embolic vs hypotensive (in setting of septic shock, transient a-fib, ARDS)  PAF (paroxysmal atrial fibrillation) (HCC) 03/15/2012   Rectal bleed    Renal calculi    bilateral--  left > right   Ulcerative colitis followed by dr Karilyn Cota   Distal UC dx 2004   Urgency of urination     Past Surgical History: Past Surgical History:  Procedure Laterality Date   APPLICATION OF WOUND VAC N/A 03/26/2012    Procedure: APPLICATION OF WOUND VAC;  Surgeon: Mariella Saa, MD;  [facility];  Service: General;  Laterality: N/A;   BALLOON DILATION N/A 01/15/2018   Procedure: [place];  Surgeon: Malissa Hippo, MD;  [facility];  Service: Endoscopy;  Laterality: N/A;  ileorectum   BALLOON DILATION N/A 09/30/2020   Procedure: BALLOON DILATION;  Surgeon: Malissa Hippo, MD;  [facility];  Service: Endoscopy;  Laterality: N/A;  colon   BIOPSY  01/19/2017   Procedure: BIOPSY;  Surgeon: Malissa Hippo, MD;  [facility];  Service: Endoscopy;;  rectal   CECOSTOMY  02/27/2012   Procedure: CECOSTOMY;  Surgeon: Fabio Bering, MD;  Location: [facility];  Service: General;  Laterality: N/A;  Cecostomy Tube Placement   COLECTOMY  02/28/2012   Procedure: TOTAL COLECTOMY;  Surgeon: Fabio Bering, MD;  Location: [facility];  Service: General;  Laterality: N/A;   COLONOSCOPY  02/16/2011   Procedure: COLONOSCOPY;  Surgeon: Malissa Hippo, MD;  [facility];  Service: Endoscopy;  Laterality: N/A;  100   CYSTOSCOPY W/ URETERAL STENT PLACEMENT Bilateral 03/20/2012   Procedure: CYSTOSCOPY WITH RETROGRADE PYELOGRAM/;  Surgeon: Sebastian Ache, MD;  Location: [facility] OR;  Service: Urology;  Laterality: Bilateral. Pt reports no stents were placed.   CYSTOSCOPY WITH RETROGRADE PYELOGRAM, URETEROSCOPY AND STENT PLACEMENT Left 07/16/2014   Procedure: CYSTOSCOPY WITH RETROGRADE PYELOGRAM,  AND LEFT STENT PLACEMENT;  Surgeon: Sebastian Ache, MD;  Location: [facility];  Service: Urology;  Laterality: Left;   CYSTOSCOPY WITH RETROGRADE PYELOGRAM, URETEROSCOPY AND STENT PLACEMENT Bilateral 07/25/2014   Procedure: CYSTOSCOPY WITH RETROGRADE PYELOGRAM, URETEROSCOPY AND BILATERAL STENT PLACEMENT;  Surgeon: Sebastian Ache, MD;  Location: [facility];  Service: Urology;  Laterality: Bilateral;   ESOPHAGOGASTRODUODENOSCOPY N/A 01/14/2016   Procedure: ESOPHAGOGASTRODUODENOSCOPY (EGD);   Surgeon: Malissa Hippo, MD;  [facility];  Service: Endoscopy;  Laterality: N/A;   FLEXIBLE SIGMOIDOSCOPY  02/27/2012   Procedure: FLEXIBLE SIGMOIDOSCOPY;  Surgeon: Malissa Hippo, MD;  [facility];  Service: Endoscopy;  Laterality: N/A;  with colonic decompression   FLEXIBLE SIGMOIDOSCOPY N/A 02/06/2013   Procedure: FLEXIBLE SIGMOIDOSCOPY;  Surgeon: Malissa Hippo, MD;  [facility];  Service: Endoscopy;  Laterality: N/A;  100-moved to 1200 Ann notified pt   FLEXIBLE SIGMOIDOSCOPY N/A 03/27/2013   Procedure: FLEXIBLE SIGMOIDOSCOPY;  Surgeon: Malissa Hippo, MD;  [facility];  Service: Endoscopy;  Laterality: N/A;  225   FLEXIBLE SIGMOIDOSCOPY N/A 12/11/2013   Procedure: FLEXIBLE SIGMOIDOSCOPY;  Surgeon: Malissa Hippo, MD;  [facility];  Service: Endoscopy;  Laterality: N/A;  200   FLEXIBLE SIGMOIDOSCOPY N/A 01/07/2015   Procedure: FLEXIBLE SIGMOIDOSCOPY;  Surgeon: Malissa Hippo, MD;  [facility];  Service: Endoscopy;  Laterality: N/A;  830   FLEXIBLE SIGMOIDOSCOPY N/A 01/14/2016   Procedure: FLEXIBLE SIGMOIDOSCOPY;  Surgeon: Malissa Hippo, MD;  [facility];  Service: Endoscopy;  Laterality: N/A;   FLEXIBLE SIGMOIDOSCOPY N/A 01/19/2017   Procedure: FLEXIBLE SIGMOIDOSCOPY;  Surgeon: Karilyn Cota,  Joline Maxcy, MD;  [facility];  Service: Endoscopy;  Laterality: N/A;  1200   FLEXIBLE SIGMOIDOSCOPY N/A 01/15/2018   Procedure: FLEXIBLE SIGMOIDOSCOPY;  Surgeon: Malissa Hippo, MD;  [facility];  Service: Endoscopy;  Laterality: N/A;  9:30   FLEXIBLE SIGMOIDOSCOPY N/A 10/04/2019   Procedure: FLEXIBLE SIGMOIDOSCOPY;  Surgeon: Malissa Hippo, MD;  [facility];  Service: Endoscopy;  Laterality: N/A;  730   FLEXIBLE SIGMOIDOSCOPY N/A 09/30/2020   Procedure: FLEXIBLE SIGMOIDOSCOPY;  Surgeon: Malissa Hippo, MD;  [facility];  Service: Endoscopy;  Laterality: N/A;  10:05    HOLMIUM LASER APPLICATION Left 07/25/2014   Procedure: HOLMIUM LASER APPLICATION, LEFT ONLY ;  Surgeon: Sebastian Ache, MD;  Location: [facility];  Service: Urology;  Laterality: Left;   ILEOSTOMY  02/28/2012   Procedure: ILEOSTOMY;  Surgeon: Fabio Bering, MD;  [facility];  Service: General;  Laterality: N/A;   ILEOSTOMY CLOSURE N/A 05/27/2013   Procedure: ILEOSTOMY TAKEDOWN WITH ILEOPROCTOSTOMY;  Surgeon: Mariella Saa, MD;  [facility]: [facility];  Service: General;  Laterality: N/A;   LAPAROTOMY  02/27/2012   Procedure: EXPLORATORY LAPAROTOMY;  Surgeon: Fabio Bering, MD;  [facility];  Service: General;  Laterality: N/A;   LAPAROTOMY N/A 03/26/2012   Procedure: EXPLORATORY LAPAROTOMY;  Surgeon: Mariella Saa, MD;  [facility];  Service: General;  Laterality: N/A;   LAPAROTOMY N/A 03/29/2012   Procedure: EXPLORATORY LAPAROTOMY, PARTIAL WOUND CLOSURE;  Surgeon: Mariella Saa, MD;  [facility];  Service: General;  Laterality: N/A;   LIVER BIOPSY N/A 03/26/2012   Procedure: LIVER BIOPSY;  Surgeon: Mariella Saa, MD;  [facility];  Service: General;  Laterality: N/A;   POLYPECTOMY  10/04/2019   Procedure: POLYPECTOMY;  Surgeon: Malissa Hippo, MD;  [facility];  Service: Endoscopy;;   SIGMOID COLECTOMY /  LOW ANTERIOR RESECTION/  END SIGMOID COLOSTOMY  11-07-2010   [place]   Sigmoid colonic perferation---  TAKE DOWN COLOSTOMY,  Jan 2013   TRANSTHORACIC ECHOCARDIOGRAM  03-26-2012  (in setting of septic shock)   report states  poorly visualized--  normal LVSF, ef 50-55% and no pericardial effusion   VACUUM ASSISTED CLOSURE CHANGE N/A 03/29/2012   Procedure: Open ABDOMINAL VACUUM  CHANGE;  Surgeon: Mariella Saa, MD;  [facility];  Service: General;  Laterality: N/A;   VACUUM ASSISTED CLOSURE CHANGE N/A 04/01/2012   Procedure: removal of abdominal vac dressing and abdominal closure;  Surgeon: Mariella Saa, MD;  Location:  [facility];  Service: General;  Laterality: N/A;    Family History: Family History  Problem Relation Age of Onset   Cancer Sister    Healthy Daughter    Hypertension Mother    Colon cancer Neg Hx     Social History: Social History   Tobacco Use  Smoking Status Former   Current packs/day: 0.00   Average packs/day: 0.5 packs/day for 12.0 years (6.0 ttl pk-yrs)   Types: Cigarettes   Start date: 07/17/1972   Quit date: 07/17/1984   Years since quitting: 38.2   Passive exposure: Never  Smokeless Tobacco Never   Social History   Substance and Sexual Activity  Alcohol Use No   Alcohol/week: 0.0 standard drinks of alcohol   Social History   Substance and Sexual Activity  Drug Use No    Allergies: Allergies  Allergen Reactions   Penicillins Other (See Comments)    "  Heart rate slowed down"  bradycardia DID THE REACTION INVOLVE: Swelling of the face/tongue/throat, SOB, or low BP? Yes Sudden or severe rash/hives, skin peeling, or the inside of the mouth or nose? No Did it require medical treatment? Yes When did it last happen?      years  If all above answers are "NO", may proceed with cephalosporin use.     Clindamycin/Lincomycin Diarrhea    C-diff   Morphine And Codeine Nausea And Vomiting   Claritin [Loratadine] Rash   Contrast Media [Iodinated Contrast Media] Hives    Patient reports hives after IV contrast.    Primaxin [Imipenem] Rash    Medications: Current Outpatient Medications  Medication Sig Dispense Refill   acetaminophen (TYLENOL) 500 MG tablet Take 1,000 mg by mouth every 6 (six) hours as needed for moderate pain or headache.      Calcium Citrate (CITRACAL PO) Take by mouth.     diphenhydrAMINE (BENADRYL) 25 MG tablet Take 25 mg by mouth daily as needed. Take before Avsola infusion     ferrous sulfate 325 (65 FE) MG tablet Take 325 mg by mouth 2 (two) times daily with a meal.     inFLIXimab-axxq (AVSOLA IV) Inject into the vein. 5mg /kg Infusion every 8  weeks     mesalamine (CANASA) 1000 MG suppository Place 1 suppository (1,000 mg total) rectally at bedtime. 30 suppository 0   Multiple Vitamin (MULTIVITAMIN WITH MINERALS) TABS tablet Take 1 tablet by mouth daily.     vitamin C (ASCORBIC ACID) 500 MG tablet Take 500 mg by mouth daily.     fluticasone (FLONASE) 50 MCG/ACT nasal spray Place 1 spray into both nostrils daily as needed for allergies or rhinitis. (Patient not taking: Reported on 10/10/2022)     No current facility-administered medications for this visit.    Review of Systems: GENERAL: negative for malaise, night sweats HEENT: No changes in hearing or vision, no nose bleeds or other nasal problems. NECK: Negative for lumps, goiter, pain and significant neck swelling RESPIRATORY: Negative for cough, wheezing CARDIOVASCULAR: Negative for chest pain, leg swelling, palpitations, orthopnea GI: SEE HPI MUSCULOSKELETAL: Negative for joint pain or swelling, back pain, and muscle pain. SKIN: Negative for lesions, rash PSYCH: Negative for sleep disturbance, mood disorder and recent psychosocial stressors. HEMATOLOGY Negative for prolonged bleeding, bruising easily, and swollen nodes. ENDOCRINE: Negative for cold or heat intolerance, polyuria, polydipsia and goiter. NEURO: negative for tremor, gait imbalance, syncope and seizures. The remainder of the review of systems is noncontributory.   Physical Exam: BP 139/75 (BP Location: Right Arm, Patient Position: Sitting, Cuff Size: Normal)   Pulse (!) 58   Temp 97.7 F (36.5 C) (Temporal)   Ht 5\' 9"  (1.753 m)   Wt 184 lb 9.6 oz (83.7 kg)   BMI 27.26 kg/m  GENERAL: The patient is AO x3, in no acute distress. HEENT: Head is normocephalic and atraumatic. EOMI are intact. Mouth is well hydrated and without lesions. NECK: Supple. No masses LUNGS: Clear to auscultation. No presence of rhonchi/wheezing/rales. Adequate chest expansion HEART: RRR, normal s1 and s2. ABDOMEN: Soft, nontender,  no guarding, no peritoneal signs, and nondistended. BS +. No masses. RECTAL EXAM: no external lesions, normal tone, no masses, brown stool without blood.*** Chaperone: EXTREMITIES: Without any cyanosis, clubbing, rash, lesions or edema. NEUROLOGIC: AOx3, no focal motor deficit. SKIN: no jaundice, no rashes  Imaging/Labs: as above  I personally reviewed and interpreted the available labs, imaging and endoscopic files.  Impression and Plan: Bebe Shaggy  Krikorian is a 62 y.o. male coming for follow up of ***   All questions were answered.      Katrinka Blazing, MD Gastroenterology and Hepatology [facility] Gastroenterology

## 2022-10-10 NOTE — Patient Instructions (Signed)
Will stop Avsola infusion at [facility] rheumatology Start Remicade 5 mg/kg every 8 weeks Ask PCP about flu, pneumonia and Shingrix vaccine Can continue mesalamine suppositories for rectal symptoms Dermatology referral

## 2022-10-11 ENCOUNTER — Telehealth (INDEPENDENT_AMBULATORY_CARE_PROVIDER_SITE_OTHER): Payer: Self-pay

## 2022-10-11 NOTE — Telephone Encounter (Signed)
I have sent a message to Wynelle Link and Bayard Hugger at [facility] advising of all this, and to make sure they see the patient in their [place].  I have called [facility] Rheumatology and spoken with Joselyn Glassman, and  asked when patient next infusion was, she says it is scheduled for 11/11/2022. I then advised that the patient would be switching to [name] for his infusions, and asked that she cancel the appointment there as the patient would now be getting these at [facility].   Per Dr. Levon Hedger he has placed the orders to [facility] for the Remicade.   Per Dr. Levon Hedger patient needs to be switched from [facility] Rheumatology to [facility] Infusion clinic.

## 2022-10-12 ENCOUNTER — Encounter (INDEPENDENT_AMBULATORY_CARE_PROVIDER_SITE_OTHER): Payer: Self-pay

## 2022-10-17 ENCOUNTER — Telehealth: Payer: Self-pay

## 2022-10-17 NOTE — Telephone Encounter (Signed)
Hello,  Auth Submission: DENIED Site of care: Site of care: [facility] Payer: BLUE CROSS BLUE SHIELD  Medication & CPT/J Code(s) submitted: Remicade (Infliximab) J1745 Route of submission (phone, fax, portal): phone,/portal Phone #[phone] Fax #[phone] Auth type: Buy/Bill PB Units/visits requested: 5mg /kg, q8weeks Reference number: [id number]    Authorization has been DENIED because patient has tried Avsola, but has not tried Community education officer. You have the right to appeal this decision at the number above. The denial letter is scanned in the media tab.   Thanks

## 2022-10-17 NOTE — Telephone Encounter (Signed)
Good Morning, I have called [employer] to find the best and fastest way to appeal this decision and the best way is a peer to peer. The phone call will be to explain to their pharmacist why Remicade is the better option than Avsola, since we do not do Inflectra in our office. I can call and schedule this for a provider or a nurse in your office to speak with them and go over clinicals or you can schedule at [phone].   Let me know how I can assist. Thanks

## 2022-10-21 NOTE — Telephone Encounter (Signed)
Hi Keyana, Thanks for the information Could you please help [place] schedule this for Monday 9/30 at 1230 or Monday 10/7 at the same time? Thanks

## 2022-10-24 NOTE — Telephone Encounter (Signed)
Thanks [facility] My direct number is [phone] Thanks

## 2022-10-24 NOTE — Telephone Encounter (Signed)
Thank you :)

## 2022-10-24 NOTE — Telephone Encounter (Signed)
Great, I have updated the number. Connor Small will call on 10/7 at 12pm to discuss clinicals.

## 2022-10-24 NOTE — Telephone Encounter (Signed)
Patient made aware of the situation and aware a peer to peer is scheduled for 10/31/22.

## 2022-10-24 NOTE — Telephone Encounter (Signed)
Hi Dr. Levon Hedger, I have set this up for 10/7 at 12-12:30. I gave them the office number because that's what I had available at the time, but told them I would call back with a direct phone number. Could you give that to me? Thanks!

## 2022-10-31 ENCOUNTER — Other Ambulatory Visit: Payer: Self-pay

## 2022-10-31 ENCOUNTER — Other Ambulatory Visit (INDEPENDENT_AMBULATORY_CARE_PROVIDER_SITE_OTHER): Payer: Self-pay | Admitting: Gastroenterology

## 2022-10-31 NOTE — Telephone Encounter (Signed)
Patient made aware the Insurance will not pay for Remicade, and Dr. Levon Hedger had sent [facility] infusion center a message to start Avsola, patient aware [facility] to call him to set up.

## 2022-10-31 NOTE — Telephone Encounter (Signed)
Per Dr. Levon Hedger after the Peer to Peer, We are going to prescribe Avsola. The infusion team at [facility] has been made aware per Dr.Castaneda.

## 2022-11-01 ENCOUNTER — Encounter (INDEPENDENT_AMBULATORY_CARE_PROVIDER_SITE_OTHER): Payer: Self-pay | Admitting: Gastroenterology

## 2022-11-01 NOTE — Telephone Encounter (Signed)
Reba, is there anything we may be missing? This is the same code we have used before and he was told he may be charged at full amount as a diagnostic

## 2022-11-02 ENCOUNTER — Ambulatory Visit (HOSPITAL_COMMUNITY)
Admission: RE | Admit: 2022-11-02 | Discharge: 2022-11-02 | Disposition: A | Discharge status: Home / Self Care | Payer: BC Managed Care – PPO | Attending: Gastroenterology | Admitting: Gastroenterology

## 2022-11-02 ENCOUNTER — Ambulatory Visit (HOSPITAL_COMMUNITY): Payer: BC Managed Care – PPO | Admitting: Anesthesiology

## 2022-11-02 ENCOUNTER — Encounter (HOSPITAL_COMMUNITY): Payer: Self-pay | Admitting: Gastroenterology

## 2022-11-02 ENCOUNTER — Telehealth: Payer: Self-pay

## 2022-11-02 ENCOUNTER — Other Ambulatory Visit: Payer: Self-pay

## 2022-11-02 ENCOUNTER — Encounter (HOSPITAL_COMMUNITY)
Admission: RE | Disposition: A | Discharge status: Home / Self Care | Payer: Self-pay | Source: Home / Self Care | Attending: Gastroenterology

## 2022-11-02 DIAGNOSIS — Z86718 Personal history of other venous thrombosis and embolism: Secondary | ICD-10-CM | POA: Insufficient documentation

## 2022-11-02 DIAGNOSIS — D759 Disease of blood and blood-forming organs, unspecified: Secondary | ICD-10-CM | POA: Diagnosis not present

## 2022-11-02 DIAGNOSIS — Z9049 Acquired absence of other specified parts of digestive tract: Secondary | ICD-10-CM | POA: Diagnosis not present

## 2022-11-02 DIAGNOSIS — Z8711 Personal history of peptic ulcer disease: Secondary | ICD-10-CM | POA: Insufficient documentation

## 2022-11-02 DIAGNOSIS — K9189 Other postprocedural complications and disorders of digestive system: Secondary | ICD-10-CM | POA: Diagnosis not present

## 2022-11-02 DIAGNOSIS — Z79620 Long term (current) use of immunosuppressive biologic: Secondary | ICD-10-CM | POA: Insufficient documentation

## 2022-11-02 DIAGNOSIS — K529 Noninfective gastroenteritis and colitis, unspecified: Secondary | ICD-10-CM | POA: Diagnosis not present

## 2022-11-02 DIAGNOSIS — Z980 Intestinal bypass and anastomosis status: Secondary | ICD-10-CM | POA: Insufficient documentation

## 2022-11-02 DIAGNOSIS — D649 Anemia, unspecified: Secondary | ICD-10-CM | POA: Diagnosis not present

## 2022-11-02 DIAGNOSIS — I480 Paroxysmal atrial fibrillation: Secondary | ICD-10-CM | POA: Diagnosis not present

## 2022-11-02 DIAGNOSIS — Z87891 Personal history of nicotine dependence: Secondary | ICD-10-CM | POA: Insufficient documentation

## 2022-11-02 DIAGNOSIS — K5190 Ulcerative colitis, unspecified, without complications: Secondary | ICD-10-CM | POA: Insufficient documentation

## 2022-11-02 DIAGNOSIS — K56609 Unspecified intestinal obstruction, unspecified as to partial versus complete obstruction: Secondary | ICD-10-CM

## 2022-11-02 DIAGNOSIS — Z1211 Encounter for screening for malignant neoplasm of colon: Secondary | ICD-10-CM | POA: Insufficient documentation

## 2022-11-02 HISTORY — PX: FLEXIBLE SIGMOIDOSCOPY: SHX5431

## 2022-11-02 HISTORY — PX: BIOPSY: SHX5522

## 2022-11-02 HISTORY — PX: BALLOON DILATION: SHX5330

## 2022-11-02 SURGERY — SIGMOIDOSCOPY, FLEXIBLE
Anesthesia: General

## 2022-11-02 MED ORDER — PROPOFOL 500 MG/50ML IV EMUL
INTRAVENOUS | Status: AC
  Filled 2022-11-02: qty 50

## 2022-11-02 MED ORDER — STERILE WATER FOR IRRIGATION IR SOLN
Status: DC | PRN
  Administered 2022-11-02: 120 mL

## 2022-11-02 MED ORDER — LACTATED RINGERS IV SOLN: INTRAVENOUS | Status: DC | PRN

## 2022-11-02 MED ORDER — LACTATED RINGERS IV SOLN: INTRAVENOUS | Status: DC

## 2022-11-02 MED ORDER — PROPOFOL 10 MG/ML IV BOLUS
INTRAVENOUS | Status: DC | PRN
  Administered 2022-11-02: 40 mg via INTRAVENOUS
  Administered 2022-11-02 (×2): 50 mg via INTRAVENOUS
  Administered 2022-11-02: 40 mg via INTRAVENOUS
  Administered 2022-11-02: 100 mg via INTRAVENOUS
  Administered 2022-11-02: 50 mg via INTRAVENOUS

## 2022-11-02 NOTE — Op Note (Signed)
[facility] Patient Name: Connor Small Procedure Date: 11/02/2022 10:38 AM MRN: [id number] Date of Birth: 1960-10-05 Attending MD: Katrinka Blazing , , [id number] CSN: [id number] Age: 62 Admit Type: Outpatient Procedure:                Flexible Sigmoidoscopy Indications:              High risk colon cancer surveillance: Ulcerative                            pancolitis of 8 (or more) years duration Providers:                Katrinka Blazing, Sheran Fava, Zena Amos Referring MD:              Medicines:                Monitored Anesthesia Care Complications:            No immediate complications. Estimated Blood Loss:     Estimated blood loss: none. Procedure:                Pre-Anesthesia Assessment:                           - Prior to the procedure, a History and Physical                            was performed, and patient medications, allergies                            and sensitivities were reviewed. The patient's                            tolerance of previous anesthesia was reviewed.                           - The risks and benefits of the procedure and the                            sedation options and risks were discussed with the                            patient. All questions were answered and informed                            consent was obtained.                           After obtaining informed consent, the scope was                            passed under direct vision. The [id number]-[id number]                            ([id number]) scope was introduced through the anus  and                            advanced to the rectum, but could not pass                            anastomotic stricture. The [id number]-[id number] ([id number]) scope                            was introduced through the anus and advanced to the                            40 cm from the anal verge, into the neo terminal                            ileum. The flexible  sigmoidoscopy was accomplished                            without difficulty. The patient tolerated the                            procedure well. The quality of the bowel                            preparation was good. Scope In: 10:59:00 AM Scope Out: 11:11:53 AM Total Procedure Duration: 0 hours 12 minutes 53 seconds  Findings:      The perianal and digital rectal examinations were normal.      The ileum appeared normal.      A benign-appearing, intrinsic moderate stenosis measuring 1 cm (in       length) x 1 cm (inner diameter) was found at the anastomosis and was       traversed after switching the scope to a pediatric upper scope. A TTS       dilator was passed through the scope. Dilation with a 12-13.5-15 mm       colonic balloon dilator was performed, max size 15 mm. The dilation site       was examined and showed moderate improvement in luminal narrowing.      Inflammation was found in a continuous and circumferential pattern from       the anus to the rectum. This was graded as Mayo Score 2 (moderate, with       marked erythema, absent vascular pattern, friability, erosions), and       when compared to the previous examination, the findings are worsened.       Biopsies were taken with a cold forceps for histology.      No retroflexion was performed due to inflammation in the colon Impression:               - The terminal ileum is normal.                           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 Dilated.                           - Moderately active (Mayo Score 2) proctitis  ulcerative colitis, worsened since the last                            examination. Biopsied. Moderate Sedation:      Per Anesthesia Care Recommendation:           - Discharge patient to home (ambulatory).                           - Resume previous diet.                           - Await pathology results.                           - Will discuss again possibility of  increasing                            Avsola to every 6 weeks given suboptimal dosing -                            levels were subtherapeutic on 05/25/2022. 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 All rights reserved. The codes documented in this report are preliminary and upon coder review may  be revised to meet current compliance requirements. Katrinka Blazing, MD Katrinka Blazing,  11/02/2022 11:23:35 AM This report has been signed electronically. Number of Addenda: 0

## 2022-11-02 NOTE — Discharge Instructions (Signed)
You are being discharged to home.  Resume your previous diet.  We are waiting for your pathology results.  Will discuss again possibility of increasing Avsola to every 6 weeks given suboptimal dosing - levels were subtherapeutic on 05/25/2022.

## 2022-11-02 NOTE — Anesthesia Preprocedure Evaluation (Signed)
Anesthesia Evaluation  Patient identified by MRN, date of birth, ID band Patient awake    Reviewed: Allergy & Precautions, H&P , NPO status , Patient's Chart, lab work & pertinent test results, reviewed documented beta blocker date and time   Airway Mallampati: II  TM Distance: >3 FB Neck ROM: full    Dental no notable dental hx.    Pulmonary neg pulmonary ROS, pneumonia, former smoker   Pulmonary exam normal breath sounds clear to auscultation       Cardiovascular Exercise Tolerance: Good negative cardio ROS  Rhythm:regular Rate:Normal     Neuro/Psych  Neuromuscular disease CVA negative neurological ROS  negative psych ROS   GI/Hepatic negative GI ROS, Neg liver ROS, PUD,,,  Endo/Other  negative endocrine ROS    Renal/GU Renal diseasenegative Renal ROS  negative genitourinary   Musculoskeletal   Abdominal   Peds  Hematology negative hematology ROS (+) Blood dyscrasia, anemia   Anesthesia Other Findings   Reproductive/Obstetrics negative OB ROS                             Anesthesia Physical Anesthesia Plan  ASA: 3  Anesthesia Plan: General   Post-op Pain Management:    Induction:   PONV Risk Score and Plan: Propofol infusion  Airway Management Planned:   Additional Equipment:   Intra-op Plan:   Post-operative Plan:   Informed Consent: I have reviewed the patients History and Physical, chart, labs and discussed the procedure including the risks, benefits and alternatives for the proposed anesthesia with the patient or authorized representative who has indicated his/her understanding and acceptance.     Dental Advisory Given  Plan Discussed with: CRNA  Anesthesia Plan Comments:        Anesthesia Quick Evaluation

## 2022-11-02 NOTE — Telephone Encounter (Signed)
Thank you Charmayne Sheer, I believe he was supposed to get his next dose on 10/18, but please confirm with him

## 2022-11-02 NOTE — Telephone Encounter (Addendum)
Auth Submission: APPROVED Site of care: Site of care: [facility] Payer: BCBS Medication & CPT/J Code(s) submitted: Avsola (infliximab-axxq) [id number] Route of submission (phone, fax, portal): fax Phone # Fax # Auth type: Buy/Bill PB Units/visits requested: 450u, 403.5mg  q6weeks Reference number: [id number], [id number] Approval from: 11/09/22 to 11/09/23

## 2022-11-02 NOTE — Transfer of Care (Signed)
Immediate Anesthesia Transfer of Care Note  Patient: Connor Small  Procedure(s) Performed: FLEXIBLE SIGMOIDOSCOPY BIOPSY BALLOON DILATION  Patient Location: Endoscopy Unit  Anesthesia Type:General  Level of Consciousness: drowsy  Airway & Oxygen Therapy: Patient Spontanous Breathing  Post-op Assessment: Report given to RN and Post -op Vital signs reviewed and stable  Post vital signs: Reviewed and stable  Last Vitals:  Vitals Value Taken Time  BP 97/49 11/02/22 1116  Temp 36.4 C 11/02/22 1116  Pulse 61 11/02/22 1116  Resp 11 11/02/22 1116  SpO2 98 % 11/02/22 1116    Last Pain:  Vitals:   11/02/22 1116  TempSrc: Oral  PainSc: 0-No pain      Patients Stated Pain Goal: 8 (11/02/22 0904)  Complications: No notable events documented.

## 2022-11-02 NOTE — Interval H&P Note (Signed)
History and Physical Interval Note:  11/02/2022 9:17 AM  Connor Small  has presented today for surgery, with the diagnosis of ULCERATIVE COLITIS.  The various methods of treatment have been discussed with the patient and family. After consideration of risks, benefits and other options for treatment, the patient has consented to  Procedure(s) with comments: FLEXIBLE SIGMOIDOSCOPY (N/A) - 10:30AM;ASA 1 as a surgical intervention.  The patient's history has been reviewed, patient examined, no change in status, stable for surgery.  I have reviewed the patient's chart and labs.  Questions were answered to the patient's satisfaction.     Katrinka Blazing Mayorga

## 2022-11-02 NOTE — Telephone Encounter (Signed)
Thank you.  It is possible we may need to increase his frequency to every 6 weeks but the patient has not decided yet if he wants to proceed with this

## 2022-11-02 NOTE — Telephone Encounter (Signed)
You're welcome, We have called patient and scheduled him for 10/18.

## 2022-11-03 ENCOUNTER — Encounter (INDEPENDENT_AMBULATORY_CARE_PROVIDER_SITE_OTHER): Payer: Self-pay | Admitting: Gastroenterology

## 2022-11-03 LAB — SURGICAL PATHOLOGY

## 2022-11-03 NOTE — Addendum Note (Signed)
Addended by: Dolores Frame on: 11/03/2022 10:36 PM   Modules accepted: Orders

## 2022-11-04 ENCOUNTER — Encounter (INDEPENDENT_AMBULATORY_CARE_PROVIDER_SITE_OTHER): Payer: Self-pay | Admitting: *Deleted

## 2022-11-04 NOTE — Telephone Encounter (Signed)
I will reach out to insurance and get this updated.

## 2022-11-05 NOTE — Anesthesia Postprocedure Evaluation (Signed)
Anesthesia Post Note  Patient: Connor Small  Procedure(s) Performed: FLEXIBLE SIGMOIDOSCOPY BIOPSY BALLOON DILATION  Patient location during evaluation: Phase II Anesthesia Type: General Level of consciousness: awake Pain management: pain level controlled Vital Signs Assessment: post-procedure vital signs reviewed and stable Respiratory status: spontaneous breathing and respiratory function stable Cardiovascular status: blood pressure returned to baseline and stable Postop Assessment: no headache and no apparent nausea or vomiting Anesthetic complications: no Comments: Late entry   No notable events documented.   Last Vitals:  Vitals:   11/02/22 1119 11/02/22 1125  BP:  (!) 108/51  Pulse: (!) 55 62  Resp: 12 12  Temp:    SpO2:  100%    Last Pain:  Vitals:   11/02/22 1116  TempSrc: Oral  PainSc: 0-No pain                 Windell Norfolk

## 2022-11-09 NOTE — Telephone Encounter (Signed)
Hi Dr. Levon Hedger, Prior Berkley Harvey has been updated, Patient is good for q6 weeks infusion.

## 2022-11-10 ENCOUNTER — Encounter (HOSPITAL_COMMUNITY): Payer: Self-pay | Admitting: Gastroenterology

## 2022-11-11 ENCOUNTER — Encounter: Payer: BC Managed Care – PPO | Attending: Gastroenterology | Admitting: *Deleted

## 2022-11-11 VITALS — BP 144/73 | HR 51 | Temp 97.5°F | Resp 18

## 2022-11-11 DIAGNOSIS — K51219 Ulcerative (chronic) proctitis with unspecified complications: Secondary | ICD-10-CM

## 2022-11-11 MED ORDER — SODIUM CHLORIDE 0.9 % IV SOLN
5.0000 mg/kg | Freq: Once | INTRAVENOUS | Status: AC
  Administered 2022-11-11: 400 mg via INTRAVENOUS
  Filled 2022-11-11: qty 40

## 2022-11-11 MED ORDER — DIPHENHYDRAMINE HCL 25 MG PO CAPS: 25.0000 mg | ORAL_CAPSULE | Freq: Once | ORAL | Status: DC

## 2022-11-11 MED ORDER — METHYLPREDNISOLONE SODIUM SUCC 40 MG IJ SOLR
40.0000 mg | Freq: Once | INTRAMUSCULAR | Status: AC
  Administered 2022-11-11: 40 mg via INTRAVENOUS
  Filled 2022-11-11: qty 1

## 2022-11-11 MED ORDER — ACETAMINOPHEN 325 MG PO TABS: 650.0000 mg | ORAL_TABLET | Freq: Once | ORAL | Status: DC

## 2022-11-11 NOTE — Progress Notes (Signed)
Diagnosis: Ulcerative Colitis  Provider:  Katrinka Blazing MD  Procedure: IV Infusion  IV Type: Peripheral, IV Location: R Hand  Avsola (infliximab-axxq), Dose: 400 mg  Infusion Start Time: 0930  Infusion Stop Time: 1140  Post Infusion IV Care: Observation period completed  Discharge: Condition: Good, Destination: Home . AVS Provided  Performed by:  Daleen Squibb, RN

## 2022-12-21 ENCOUNTER — Encounter: Payer: BC Managed Care – PPO | Attending: Gastroenterology | Admitting: Internal Medicine

## 2022-12-21 VITALS — BP 136/79 | HR 54 | Temp 97.7°F | Resp 14

## 2022-12-21 DIAGNOSIS — K51219 Ulcerative (chronic) proctitis with unspecified complications: Secondary | ICD-10-CM | POA: Insufficient documentation

## 2022-12-21 MED ORDER — ACETAMINOPHEN 325 MG PO TABS: 650.0000 mg | ORAL_TABLET | Freq: Once | ORAL | Status: DC

## 2022-12-21 MED ORDER — SODIUM CHLORIDE 0.9 % IV SOLN
400.0000 mg | Freq: Once | INTRAVENOUS | Status: AC
  Administered 2022-12-21: 400 mg via INTRAVENOUS
  Filled 2022-12-21: qty 40

## 2022-12-21 MED ORDER — DIPHENHYDRAMINE HCL 25 MG PO CAPS: 25.0000 mg | ORAL_CAPSULE | Freq: Once | ORAL | Status: DC

## 2022-12-21 MED ORDER — METHYLPREDNISOLONE SODIUM SUCC 40 MG IJ SOLR
40.0000 mg | Freq: Once | INTRAMUSCULAR | Status: AC
  Administered 2022-12-21: 40 mg via INTRAVENOUS
  Filled 2022-12-21: qty 1

## 2022-12-21 NOTE — Progress Notes (Signed)
Diagnosis: Ulcerative Colitis  Provider:  Katrinka Blazing MD  Procedure: IV Infusion  IV Type: Peripheral, IV Location: R Hand  Avsola (infliximab-axxq), Dose: 400 mg  Infusion Start Time: 1143  Infusion Stop Time: 1413  Post Infusion IV Care: Observation period completed  Discharge: Condition: Good, Destination: Home . AVS Provided  Performed by:  Cleotilde Neer, LPN

## 2022-12-29 ENCOUNTER — Other Ambulatory Visit: Payer: Self-pay | Admitting: Emergency Medicine

## 2023-02-03 ENCOUNTER — Ambulatory Visit: Payer: BC Managed Care – PPO

## 2023-02-10 ENCOUNTER — Encounter: Payer: BC Managed Care – PPO | Attending: Gastroenterology | Admitting: Internal Medicine

## 2023-02-10 VITALS — BP 118/79 | HR 56 | Temp 98.0°F | Resp 16 | Wt 177.9 lb

## 2023-02-10 DIAGNOSIS — K51219 Ulcerative (chronic) proctitis with unspecified complications: Secondary | ICD-10-CM | POA: Diagnosis not present

## 2023-02-10 MED ORDER — ACETAMINOPHEN 325 MG PO TABS
650.0000 mg | ORAL_TABLET | Freq: Once | ORAL | Status: DC
Start: 2023-02-10 — End: 2023-02-10
  Filled 2023-02-10: qty 2

## 2023-02-10 MED ORDER — METHYLPREDNISOLONE SODIUM SUCC 40 MG IJ SOLR
40.0000 mg | Freq: Once | INTRAMUSCULAR | Status: AC
  Administered 2023-02-10: 40 mg via INTRAVENOUS
  Filled 2023-02-10: qty 1

## 2023-02-10 MED ORDER — DIPHENHYDRAMINE HCL 25 MG PO CAPS
25.0000 mg | ORAL_CAPSULE | Freq: Once | ORAL | Status: DC
Start: 2023-02-10 — End: 2023-02-10
  Filled 2023-02-10: qty 1

## 2023-02-10 MED ORDER — SODIUM CHLORIDE 0.9 % IV SOLN
5.0000 mg/kg | Freq: Once | INTRAVENOUS | Status: AC
  Administered 2023-02-10: 400 mg via INTRAVENOUS
  Filled 2023-02-10: qty 40

## 2023-02-10 NOTE — Progress Notes (Signed)
Diagnosis: Ulcerative Colitis  Provider:  Katrinka Blazing MD  Procedure: IV Infusion  IV Type: Peripheral, IV Location: R Hand  Avsola (infliximab-axxq), Dose: 400 mg  Infusion Start Time: 1128  Infusion Stop Time: 1405  Post Infusion IV Care: Observation period completed  Discharge: Condition: Good, Destination: Home . AVS Provided  Performed by:  Cleotilde Neer, LPN

## 2023-03-14 ENCOUNTER — Other Ambulatory Visit: Payer: Self-pay

## 2023-03-24 ENCOUNTER — Encounter: Payer: BC Managed Care – PPO | Attending: Gastroenterology | Admitting: Internal Medicine

## 2023-03-24 VITALS — BP 123/77 | HR 51 | Temp 97.9°F | Resp 16 | Wt 177.9 lb

## 2023-03-24 DIAGNOSIS — K51019 Ulcerative (chronic) pancolitis with unspecified complications: Secondary | ICD-10-CM | POA: Insufficient documentation

## 2023-03-24 MED ORDER — ACETAMINOPHEN 325 MG PO TABS: 650.0000 mg | ORAL_TABLET | Freq: Once | ORAL | Status: DC

## 2023-03-24 MED ORDER — DIPHENHYDRAMINE HCL 25 MG PO CAPS: 25.0000 mg | ORAL_CAPSULE | Freq: Once | ORAL | Status: DC

## 2023-03-24 MED ORDER — METHYLPREDNISOLONE SODIUM SUCC 40 MG IJ SOLR
40.0000 mg | Freq: Once | INTRAMUSCULAR | Status: AC
  Administered 2023-03-24: 40 mg via INTRAVENOUS

## 2023-03-24 MED ORDER — SODIUM CHLORIDE 0.9 % IV SOLN
5.0000 mg/kg | Freq: Once | INTRAVENOUS | Status: AC
  Administered 2023-03-24: 400 mg via INTRAVENOUS
  Filled 2023-03-24: qty 40

## 2023-03-24 NOTE — Progress Notes (Signed)
 Diagnosis: Ulcerative Colitis  Provider:  Katrinka Blazing MD  Procedure: IV Infusion  IV Type: Peripheral, IV Location: R Antecubital  Avsola (infliximab-axxq), Dose: 400 mg  Infusion Start Time: 1042  Infusion Stop Time: 1200  Post Infusion IV Care: Observation period completed  Discharge: Condition: Good, Destination: Home . AVS Provided  Performed by:  Cleotilde Neer, LPN

## 2023-04-06 ENCOUNTER — Ambulatory Visit (INDEPENDENT_AMBULATORY_CARE_PROVIDER_SITE_OTHER): Payer: BC Managed Care – PPO | Admitting: Gastroenterology

## 2023-04-06 VITALS — BP 128/78 | HR 59 | Temp 97.8°F | Ht 69.0 in | Wt 187.0 lb

## 2023-04-06 DIAGNOSIS — Z1159 Encounter for screening for other viral diseases: Secondary | ICD-10-CM

## 2023-04-06 DIAGNOSIS — K51219 Ulcerative (chronic) proctitis with unspecified complications: Secondary | ICD-10-CM

## 2023-04-06 DIAGNOSIS — Z111 Encounter for screening for respiratory tuberculosis: Secondary | ICD-10-CM

## 2023-04-06 MED ORDER — MESALAMINE 1000 MG RE SUPP: 1000.0000 mg | Freq: Every day | RECTAL | 3 refills | Status: DC

## 2023-04-06 NOTE — Progress Notes (Unsigned)
 Connor Small, M.D. Gastroenterology & Hepatology [facility] Gastroenterology [address] [place], [place] [address]  Primary Care Physician: Eartha Inch, MD [address] [place] [address]  I will communicate my assessment and recommendations to the referring MD via EMR.  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  The patient was last seen on 10/10/2022. At that time, the patient was advised to continue Remicade 5 mg/kg every 8 weeks and mesalamine suppositories.  Patient was advised to take the flu, pneumonia and Shingrix vaccination.  Referred to dermatology.  Underwent repeat flexible sigmoidoscopy with finding described below.  As he was having active inflammation, his Avsola was increased to every 6 weeks.  States that his last infliximab, he had some palpitations after a few hours, which resolved on it sown. Does not know if related to medication or if it was just because he was eating ice cream.  Sometime may have some sme small amount of blood when he wipes. Has 6-8 Bms per day - stools can be normal or soft. The patient denies having any nausea, vomiting, fever, chills, hematochezia, melena, hematemesis, abdominal distention, abdominal pain, diarrhea, jaundice, pruritus or weight loss.  Needs refills for mesalamine suppositories, which he uses every night.  Last flu shot: 2024 Last pneumonia shot:never Last evaluation by dermatology: upcoming appointment inSeptember 2025 Last zoster vaccine: never COVID-19 shot: 2 initial doses - Moderna   Last flex sig: 11/02/2022 - The terminal ileum is normal. -  Stricture at the colonic anastomosis. Dilated. - Moderately active ( Mayo Score 2) proctitis ulcerative colitis, worsened since the last examination. Biopsied.  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   HAP (hospital-acquired pneumonia) 04/21/2012   Hemorrhoids    History of acute renal failure    post-op  total colectomy 02-27-2012--  required dialysis--  resolved   History of ARDS    02-27-2012  post op total colectomy -- septic shock-  required intubation/ventilator for 3-4 wks   History of atrial fibrillation    02/ 2014--  no issues since   History of Clostridium difficile colitis    02-27-2012--  fulminant C.Diff colitis caused toxic megacolon--  s/p  total colectomy   History of DVT (deep vein thrombosis)    2014--  right upper extremity   History of kidney stones    History of pleural effusion    acute- bilateral in setting septic shock post op Feb 2014   History of septic shock dx's at this time--  ARDS, ARF, CVA, A-Fib   Feb 2014--  post op total colectomy from toxic megacolon/ fulminant C.Diff Colitis   History of stroke March 2014 --  residual right weaker than prior to cva, unable to make fist   Bilateral cerebral and right cerebellar infarcts - felt to be embolic vs hypotensive (in setting of septic shock, transient a-fib, ARDS)   PAF (paroxysmal atrial fibrillation) (HCC) 03/15/2012   Rectal bleed  Renal calculi    bilateral--  left > right   Ulcerative colitis followed by dr Karilyn Cota   Distal UC dx 2004   Urgency of urination     Past Surgical History: Past Surgical History:  Procedure Laterality Date   APPLICATION OF WOUND VAC N/A 03/26/2012   Procedure: APPLICATION OF WOUND VAC;  Surgeon: Mariella Saa, MD;  [facility]: [facility];  Service: General;  Laterality: N/A;   BALLOON DILATION N/A 01/15/2018   Procedure: [place];  Surgeon: Malissa Hippo, MD;  [facility];  Service: Endoscopy;  Laterality: N/A;  ileorectum   BALLOON DILATION N/A 09/30/2020   Procedure: BALLOON DILATION;  Surgeon: Malissa Hippo, MD;  [facility];  Service: Endoscopy;  Laterality: N/A;  colon   BALLOON DILATION  11/02/2022   Procedure: BALLOON DILATION;  Surgeon: Dolores Frame, MD;  [facility];  Service: Gastroenterology;;   BIOPSY  01/19/2017   Procedure: BIOPSY;  Surgeon: Malissa Hippo, MD;  [facility];  Service: Endoscopy;;  rectal   BIOPSY  11/02/2022   Procedure: BIOPSY;  Surgeon: Dolores Frame, MD;  [facility];  Service: Gastroenterology;;   CECOSTOMY  02/27/2012   Procedure: CECOSTOMY;  Surgeon: Fabio Bering, MD;  [facility]: [facility];  Service: General;  Laterality: N/A;  Cecostomy Tube Placement   COLECTOMY  02/28/2012   Procedure: TOTAL COLECTOMY;  Surgeon: Fabio Bering, MD;  [facility]: [facility];  Service: General;  Laterality: N/A;   COLONOSCOPY  02/16/2011   Procedure: COLONOSCOPY;  Surgeon: Malissa Hippo, MD;  [facility];  Service: Endoscopy;  Laterality: N/A;  100   CYSTOSCOPY W/ URETERAL STENT PLACEMENT Bilateral 03/20/2012   Procedure: CYSTOSCOPY WITH RETROGRADE PYELOGRAM/;  Surgeon: Sebastian Ache, MD;  Location: [facility] OR;  Service: Urology;  Laterality: Bilateral. Pt reports no stents were placed.   CYSTOSCOPY WITH RETROGRADE PYELOGRAM, URETEROSCOPY AND STENT PLACEMENT Left 07/16/2014   Procedure: CYSTOSCOPY WITH RETROGRADE PYELOGRAM,  AND LEFT STENT PLACEMENT;  Surgeon: Sebastian Ache, MD;  Location: [facility];  Service: Urology;  Laterality: Left;   CYSTOSCOPY WITH RETROGRADE PYELOGRAM, URETEROSCOPY AND STENT PLACEMENT Bilateral 07/25/2014   Procedure: CYSTOSCOPY WITH RETROGRADE PYELOGRAM,  URETEROSCOPY AND BILATERAL STENT PLACEMENT;  Surgeon: Sebastian Ache, MD;  Location: [facility];  Service: Urology;  Laterality: Bilateral;   ESOPHAGOGASTRODUODENOSCOPY N/A 01/14/2016   Procedure: ESOPHAGOGASTRODUODENOSCOPY (EGD);  Surgeon: Malissa Hippo, MD;  [facility];  Service: Endoscopy;  Laterality: N/A;   FLEXIBLE SIGMOIDOSCOPY  02/27/2012   Procedure: FLEXIBLE SIGMOIDOSCOPY;  Surgeon: Malissa Hippo, MD;  [facility];  Service: Endoscopy;  Laterality: N/A;  with colonic decompression   FLEXIBLE SIGMOIDOSCOPY N/A 02/06/2013   Procedure: FLEXIBLE SIGMOIDOSCOPY;  Surgeon: Malissa Hippo, MD;  [facility];  Service: Endoscopy;  Laterality: N/A;  100-moved to 1200 Ann notified pt   FLEXIBLE SIGMOIDOSCOPY N/A 03/27/2013   Procedure: FLEXIBLE SIGMOIDOSCOPY;  Surgeon: Malissa Hippo, MD;  [facility];  Service: Endoscopy;  Laterality: N/A;  225   FLEXIBLE SIGMOIDOSCOPY N/A 12/11/2013   Procedure: FLEXIBLE SIGMOIDOSCOPY;  Surgeon: Malissa Hippo, MD;  [facility];  Service: Endoscopy;  Laterality: N/A;  200   FLEXIBLE SIGMOIDOSCOPY N/A 01/07/2015   Procedure: FLEXIBLE SIGMOIDOSCOPY;  Surgeon: Malissa Hippo, MD;  [facility];  Service: Endoscopy;  Laterality: N/A;  830   FLEXIBLE SIGMOIDOSCOPY N/A 01/14/2016  Procedure: FLEXIBLE SIGMOIDOSCOPY;  Surgeon: Malissa Hippo, MD;  [facility];  Service: Endoscopy;  Laterality: N/A;   FLEXIBLE SIGMOIDOSCOPY N/A 01/19/2017   Procedure: FLEXIBLE SIGMOIDOSCOPY;  Surgeon: Malissa Hippo, MD;  [facility];  Service: Endoscopy;  Laterality: N/A;  1200   FLEXIBLE SIGMOIDOSCOPY N/A 01/15/2018   Procedure: FLEXIBLE SIGMOIDOSCOPY;  Surgeon: Malissa Hippo, MD;  [facility];  Service: Endoscopy;  Laterality: N/A;  9:30   FLEXIBLE SIGMOIDOSCOPY N/A 10/04/2019   Procedure: FLEXIBLE SIGMOIDOSCOPY;  Surgeon: Malissa Hippo, MD;  [facility];  Service: Endoscopy;  Laterality: N/A;  730   FLEXIBLE SIGMOIDOSCOPY N/A 09/30/2020   Procedure: FLEXIBLE SIGMOIDOSCOPY;  Surgeon: Malissa Hippo, MD;  [facility];  Service: Endoscopy;  Laterality: N/A;  10:05   FLEXIBLE SIGMOIDOSCOPY N/A 11/02/2022   Procedure: FLEXIBLE SIGMOIDOSCOPY;  Surgeon: Dolores Frame, MD;  [facility];  Service: Gastroenterology;  Laterality: N/A;  10:30AM;ASA 1   HOLMIUM LASER APPLICATION Left 07/25/2014   Procedure: HOLMIUM LASER APPLICATION, LEFT ONLY ;  Surgeon: Sebastian Ache, MD;  Location: [facility];  Service: Urology;  Laterality: Left;   ILEOSTOMY  02/28/2012   Procedure: ILEOSTOMY;  Surgeon: Fabio Bering, MD;  [facility];  Service: General;  Laterality: N/A;   ILEOSTOMY CLOSURE N/A 05/27/2013   Procedure: ILEOSTOMY TAKEDOWN WITH ILEOPROCTOSTOMY;  Surgeon: Mariella Saa, MD;  Location: [facility];  Service: General;  Laterality: N/A;   LAPAROTOMY  02/27/2012   Procedure: EXPLORATORY LAPAROTOMY;  Surgeon: Fabio Bering, MD;  [facility];  Service: General;  Laterality: N/A;   LAPAROTOMY N/A 03/26/2012   Procedure: EXPLORATORY LAPAROTOMY;  Surgeon: Mariella Saa, MD;  [facility];  Service: General;  Laterality: N/A;   LAPAROTOMY N/A 03/29/2012   Procedure: EXPLORATORY LAPAROTOMY, PARTIAL WOUND CLOSURE;  Surgeon: Mariella Saa, MD;  [facility];  Service: General;  Laterality: N/A;   LIVER BIOPSY N/A 03/26/2012   Procedure: LIVER BIOPSY;  Surgeon: Mariella Saa, MD;  [facility];  Service: General;  Laterality: N/A;   POLYPECTOMY  10/04/2019   Procedure: POLYPECTOMY;  Surgeon: Malissa Hippo, MD;  [facility];  Service: Endoscopy;;   SIGMOID COLECTOMY /  LOW ANTERIOR RESECTION/  END SIGMOID COLOSTOMY  11-07-2010   [place]   Sigmoid colonic perferation---  TAKE DOWN COLOSTOMY,  Jan 2013   TRANSTHORACIC ECHOCARDIOGRAM  03-26-2012  (in setting of  septic shock)   report states  poorly visualized--  normal LVSF, ef 50-55% and no pericardial effusion   VACUUM ASSISTED CLOSURE CHANGE N/A 03/29/2012   Procedure: Open ABDOMINAL VACUUM  CHANGE;  Surgeon: Mariella Saa, MD;  [facility];  Service: General;  Laterality: N/A;   VACUUM ASSISTED CLOSURE CHANGE N/A 04/01/2012   Procedure: removal of abdominal vac dressing and abdominal closure;  Surgeon: Mariella Saa, MD;  Location[facility];  Service: General;  Laterality: N/A;    Family History: Family History  Problem Relation Age of Onset   Cancer Sister    Healthy Daughter    Hypertension Mother    Colon cancer Neg Hx     Social History: Social History   Tobacco Use  Smoking Status Former   Current packs/day: 0.00   Average packs/day: 0.5 packs/day for 12.0 years (6.0 ttl pk-yrs)   Types: Cigarettes   Start date: 07/17/1972   Quit date: 07/17/1984  Years since quitting: 38.7   Passive exposure: Never  Smokeless Tobacco Never   Social History   Substance and Sexual Activity  Alcohol Use No   Alcohol/week: 0.0 standard drinks of alcohol   Social History   Substance and Sexual Activity  Drug Use No    Allergies: Allergies  Allergen Reactions   Penicillins Other (See Comments)    "Heart rate slowed down"  bradycardia DID THE REACTION INVOLVE: Swelling of the face/tongue/throat, SOB, or low BP? Yes Sudden or severe rash/hives, skin peeling, or the inside of the mouth or nose? No Did it require medical treatment? Yes When did it last happen?      years  If all above answers are "NO", may proceed with cephalosporin use.     Clindamycin/Lincomycin Diarrhea    C-diff   Morphine And Codeine Nausea And Vomiting   Claritin [Loratadine] Rash   Contrast Media [Iodinated Contrast Media] Hives    Patient reports hives after IV contrast.    Primaxin [Imipenem] Rash    Medications: Current Outpatient Medications  Medication Sig Dispense Refill   acetaminophen  (TYLENOL) 500 MG tablet Take 1,000 mg by mouth every 6 (six) hours as needed for moderate pain or headache.      Calcium Citrate (CITRACAL PO) Take by mouth.     diphenhydrAMINE (BENADRYL) 25 MG tablet Take 25 mg by mouth daily as needed. Take before Avsola infusion     ferrous sulfate 325 (65 FE) MG tablet Take 325 mg by mouth 2 (two) times daily with a meal.     fluticasone (FLONASE) 50 MCG/ACT nasal spray Place 1 spray into both nostrils daily as needed for allergies or rhinitis.     mesalamine (CANASA) 1000 MG suppository Place 1 suppository (1,000 mg total) rectally at bedtime. 30 suppository 0   Multiple Vitamin (MULTIVITAMIN WITH MINERALS) TABS tablet Take 1 tablet by mouth daily.     vitamin C (ASCORBIC ACID) 500 MG tablet Take 500 mg by mouth daily.     No current facility-administered medications for this visit.    Review of Systems: GENERAL: negative for malaise, night sweats HEENT: No changes in hearing or vision, no nose bleeds or other nasal problems. NECK: Negative for lumps, goiter, pain and significant neck swelling RESPIRATORY: Negative for cough, wheezing CARDIOVASCULAR: Negative for chest pain, leg swelling, palpitations, orthopnea GI: SEE HPI MUSCULOSKELETAL: Negative for joint pain or swelling, back pain, and muscle pain. SKIN: Negative for lesions, rash PSYCH: Negative for sleep disturbance, mood disorder and recent psychosocial stressors. HEMATOLOGY Negative for prolonged bleeding, bruising easily, and swollen nodes. ENDOCRINE: Negative for cold or heat intolerance, polyuria, polydipsia and goiter. NEURO: negative for tremor, gait imbalance, syncope and seizures. The remainder of the review of systems is noncontributory.   Physical Exam: BP 128/78   Pulse (!) 59   Temp 97.8 F (36.6 C)   Ht 5\' 9"  (1.753 m)   Wt 187 lb (84.8 kg)   BMI 27.62 kg/m  GENERAL: The patient is AO x3, in no acute distress. HEENT: Head is normocephalic and atraumatic. EOMI are  intact. Mouth is well hydrated and without lesions. NECK: Supple. No masses LUNGS: Clear to auscultation. No presence of rhonchi/wheezing/rales. Adequate chest expansion HEART: RRR, normal s1 and s2. ABDOMEN: Soft, nontender, no guarding, no peritoneal signs, and nondistended. BS +. No masses. RECTAL EXAM: no external lesions, normal tone, no masses, brown stool without blood.*** Chaperone: EXTREMITIES: Without any cyanosis, clubbing, rash, lesions or edema. NEUROLOGIC: AOx3, no  focal motor deficit. SKIN: no jaundice, no rashes  Imaging/Labs: as above  I personally reviewed and interpreted the available labs, imaging and endoscopic files.  Impression and Plan: BUNNY LOWDERMILK is a 63 y.o. male coming for follow up of ***   All questions were answered.      Connor Blazing, MD Gastroenterology and Hepatology [facility] Gastroenterology

## 2023-04-06 NOTE — Patient Instructions (Addendum)
 Continue Avsola 5 mg/kg every 6 weeks Continue mesalamine suppositories every night Perform blood workup in May 2025 Repeat flex sigmoidoscopy a year from last Ask PCP about shingles and pneumonia vaccination

## 2023-04-14 ENCOUNTER — Ambulatory Visit: Payer: BC Managed Care – PPO

## 2023-05-05 ENCOUNTER — Encounter: Payer: BC Managed Care – PPO | Attending: Gastroenterology | Admitting: Emergency Medicine

## 2023-05-05 VITALS — BP 152/83 | HR 58 | Temp 97.6°F | Resp 16

## 2023-05-05 DIAGNOSIS — K51019 Ulcerative (chronic) pancolitis with unspecified complications: Secondary | ICD-10-CM | POA: Insufficient documentation

## 2023-05-05 MED ORDER — SODIUM CHLORIDE 0.9 % IV SOLN
400.0000 mg | Freq: Once | INTRAVENOUS | Status: AC
  Administered 2023-05-05: 400 mg via INTRAVENOUS
  Filled 2023-05-05: qty 40

## 2023-05-05 MED ORDER — DIPHENHYDRAMINE HCL 25 MG PO CAPS
25.0000 mg | ORAL_CAPSULE | Freq: Once | ORAL | Status: DC
Start: 2023-05-05 — End: 2023-05-05

## 2023-05-05 MED ORDER — ACETAMINOPHEN 325 MG PO TABS: 650.0000 mg | ORAL_TABLET | Freq: Once | ORAL | Status: DC

## 2023-05-05 MED ORDER — METHYLPREDNISOLONE SODIUM SUCC 40 MG IJ SOLR
40.0000 mg | Freq: Once | INTRAMUSCULAR | Status: AC
Start: 2023-05-05 — End: 2023-05-05
  Administered 2023-05-05: 40 mg via INTRAVENOUS

## 2023-05-05 NOTE — Progress Notes (Signed)
 Diagnosis: Ulcerative Colitis  Provider:  Katrinka Blazing MD  Procedure: IV Infusion  IV Type: Peripheral, IV Location: R Forearm  Avsola (infliximab-axxq), Dose: 400 mg  Infusion Start Time: 10:30  Infusion Stop Time: 11:40  Post Infusion IV Care: Observation period completed and Peripheral IV Discontinued  Discharge: Condition: Good, Destination: Home . AVS Provided  Performed by:  Enzo Montgomery, RN

## 2023-05-17 ENCOUNTER — Encounter: Payer: Self-pay | Admitting: Gastroenterology

## 2023-06-13 LAB — COMPLETE METABOLIC PANEL WITHOUT GFR
AG Ratio: 1.3 (calc) (ref 1.0–2.5)
ALT: 18 U/L (ref 9–46)
AST: 23 U/L (ref 10–35)
Albumin: 3.6 g/dL (ref 3.6–5.1)
Alkaline phosphatase (APISO): 71 U/L (ref 35–144)
BUN: 21 mg/dL (ref 7–25)
CO2: 27 mmol/L (ref 20–32)
Calcium: 8.3 mg/dL — ABNORMAL LOW (ref 8.6–10.3)
Chloride: 107 mmol/L (ref 98–110)
Creat: 1.26 mg/dL (ref 0.70–1.35)
Globulin: 2.7 g/dL (ref 1.9–3.7)
Glucose, Bld: 96 mg/dL (ref 65–99)
Potassium: 4.5 mmol/L (ref 3.5–5.3)
Sodium: 139 mmol/L (ref 135–146)
Total Bilirubin: 0.6 mg/dL (ref 0.2–1.2)
Total Protein: 6.3 g/dL (ref 6.1–8.1)

## 2023-06-13 LAB — QUANTIFERON-TB GOLD PLUS
Mitogen-NIL: >10 [IU]/mL
NIL: 0.01 [IU]/mL
QuantiFERON-TB Gold Plus: NEGATIVE
TB1-NIL: 0 [IU]/mL
TB2-NIL: 0 [IU]/mL

## 2023-06-13 LAB — CBC WITH DIFFERENTIAL/PLATELET
Absolute Lymphocytes: 1341 {cells}/uL (ref 850–3900)
Absolute Monocytes: 668 {cells}/uL (ref 200–950)
Basophils Absolute: 32 {cells}/uL (ref 0–200)
Basophils Relative: 0.6 %
Eosinophils Absolute: 101 {cells}/uL (ref 15–500)
Eosinophils Relative: 1.9 %
HCT: 42.1 % (ref 38.5–50.0)
Hemoglobin: 14 g/dL (ref 13.2–17.1)
MCH: 33 pg (ref 27.0–33.0)
MCHC: 33.3 g/dL (ref 32.0–36.0)
MCV: 99.3 fL (ref 80.0–100.0)
MPV: 9.3 fL (ref 7.5–12.5)
Monocytes Relative: 12.6 %
Neutro Abs: 3159 {cells}/uL (ref 1500–7800)
Neutrophils Relative %: 59.6 %
Platelets: 170 10*3/uL (ref 140–400)
RBC: 4.24 10*6/uL (ref 4.20–5.80)
RDW: 12.4 % (ref 11.0–15.0)
Total Lymphocyte: 25.3 %
WBC: 5.3 10*3/uL (ref 3.8–10.8)

## 2023-06-13 LAB — C-REACTIVE PROTEIN: CRP: <3 mg/L (ref <8.0)

## 2023-06-13 LAB — HEPATITIS B SURFACE ANTIGEN: Hepatitis B Surface Ag: NONREACTIVE

## 2023-06-16 ENCOUNTER — Encounter: Attending: Gastroenterology | Admitting: *Deleted

## 2023-06-16 VITALS — BP 134/75 | HR 50 | Temp 97.7°F | Resp 16 | Wt 187.0 lb

## 2023-06-16 DIAGNOSIS — K51019 Ulcerative (chronic) pancolitis with unspecified complications: Secondary | ICD-10-CM | POA: Insufficient documentation

## 2023-06-16 MED ORDER — ACETAMINOPHEN 325 MG PO TABS
650.0000 mg | ORAL_TABLET | Freq: Once | ORAL | Status: AC
  Administered 2023-06-16: 650 mg via ORAL

## 2023-06-16 MED ORDER — SODIUM CHLORIDE 0.9 % IV SOLN
5.0000 mg/kg | Freq: Once | INTRAVENOUS | Status: AC
  Administered 2023-06-16: 400 mg via INTRAVENOUS
  Filled 2023-06-16: qty 40

## 2023-06-16 MED ORDER — DIPHENHYDRAMINE HCL 25 MG PO CAPS
25.0000 mg | ORAL_CAPSULE | Freq: Once | ORAL | Status: AC
  Administered 2023-06-16: 25 mg via ORAL

## 2023-06-16 MED ORDER — METHYLPREDNISOLONE SODIUM SUCC 40 MG IJ SOLR
40.0000 mg | Freq: Once | INTRAMUSCULAR | Status: AC
  Administered 2023-06-16: 40 mg via INTRAVENOUS

## 2023-06-16 NOTE — Progress Notes (Signed)
 Diagnosis: Ulcerative Pancolitis with complication  Provider:  Samantha Cress MD  Procedure: IV Infusion  IV Type: Peripheral, IV Location: R Hand  Avsola  (infliximab -axxq), Dose: 400 mg  Infusion Start Time: 1040  Infusion Stop Time: 1148  Post Infusion IV Care: Observation period completed  Discharge: Condition: Good, Destination: Home . AVS Provided  Performed by:  Verneda Golder, RN

## 2023-06-21 ENCOUNTER — Ambulatory Visit (INDEPENDENT_AMBULATORY_CARE_PROVIDER_SITE_OTHER): Payer: Self-pay | Admitting: Gastroenterology

## 2023-06-22 ENCOUNTER — Telehealth (INDEPENDENT_AMBULATORY_CARE_PROVIDER_SITE_OTHER): Payer: Self-pay | Admitting: Gastroenterology

## 2023-06-22 NOTE — Telephone Encounter (Signed)
 Patient came to front desk saying someone had called him and didn't know what it was about. I told him Crystal had tried calling regarding his results. I told him she was with patients and would call him back, He agreed. [phone]

## 2023-06-22 NOTE — Telephone Encounter (Signed)
 I spoke with the patient and made him aware per Dr. Sammi Crick,  Can you please call the patient and tell the patient the CMP, CBC, CRP, hepatitis and tuberculosis testing were all normal. Pt states understanding.

## 2023-07-27 ENCOUNTER — Encounter: Attending: Gastroenterology | Admitting: *Deleted

## 2023-07-27 VITALS — BP 143/78 | HR 58 | Temp 97.5°F | Resp 16

## 2023-07-27 DIAGNOSIS — K51019 Ulcerative (chronic) pancolitis with unspecified complications: Secondary | ICD-10-CM | POA: Insufficient documentation

## 2023-07-27 MED ORDER — SODIUM CHLORIDE 0.9 % IV SOLN
400.0000 mg | Freq: Once | INTRAVENOUS | Status: AC
  Administered 2023-07-27: 400 mg via INTRAVENOUS
  Filled 2023-07-27: qty 40

## 2023-07-27 MED ORDER — DIPHENHYDRAMINE HCL 25 MG PO CAPS
25.0000 mg | ORAL_CAPSULE | Freq: Once | ORAL | Status: DC
Start: 2023-07-27 — End: 2023-07-27

## 2023-07-27 MED ORDER — METHYLPREDNISOLONE SODIUM SUCC 40 MG IJ SOLR
40.0000 mg | Freq: Once | INTRAMUSCULAR | Status: AC
  Administered 2023-07-27: 40 mg via INTRAVENOUS

## 2023-07-27 MED ORDER — ACETAMINOPHEN 325 MG PO TABS: 650.0000 mg | ORAL_TABLET | Freq: Once | ORAL | Status: DC

## 2023-07-27 NOTE — Progress Notes (Signed)
 Diagnosis: Ulcerative Colitis  Provider:  Eartha Sieving MD  Procedure: IV Infusion  IV Type: Peripheral, IV Location: R Forearm  Avsola  (infliximab -axxq), Dose: 400 mg  Infusion Start Time: 1021  Infusion Stop Time: 1127  Post Infusion IV Care: Observation period completed  Discharge: Condition: Good, Destination: Home . AVS Provided  Performed by:  Baldwin Darice Helling, RN

## 2023-08-15 ENCOUNTER — Other Ambulatory Visit (INDEPENDENT_AMBULATORY_CARE_PROVIDER_SITE_OTHER): Payer: Self-pay | Admitting: Gastroenterology

## 2023-08-15 DIAGNOSIS — K51219 Ulcerative (chronic) proctitis with unspecified complications: Secondary | ICD-10-CM

## 2023-09-08 ENCOUNTER — Encounter: Attending: Gastroenterology | Admitting: Internal Medicine

## 2023-09-08 VITALS — BP 159/86 | HR 53 | Temp 98.3°F | Resp 14

## 2023-09-08 DIAGNOSIS — K51019 Ulcerative (chronic) pancolitis with unspecified complications: Secondary | ICD-10-CM | POA: Diagnosis not present

## 2023-09-08 MED ORDER — METHYLPREDNISOLONE SODIUM SUCC 40 MG IJ SOLR
40.0000 mg | Freq: Once | INTRAMUSCULAR | Status: AC
  Administered 2023-09-08: 40 mg via INTRAVENOUS

## 2023-09-08 MED ORDER — DIPHENHYDRAMINE HCL 25 MG PO CAPS: 25.0000 mg | ORAL_CAPSULE | Freq: Once | ORAL | Status: DC

## 2023-09-08 MED ORDER — SODIUM CHLORIDE 0.9 % IV SOLN
400.0000 mg | Freq: Once | INTRAVENOUS | Status: AC
Start: 2023-09-08 — End: 2023-09-08
  Administered 2023-09-08: 400 mg via INTRAVENOUS
  Filled 2023-09-08: qty 40

## 2023-09-08 MED ORDER — ACETAMINOPHEN 325 MG PO TABS: 650.0000 mg | ORAL_TABLET | Freq: Once | ORAL | Status: DC

## 2023-09-08 NOTE — Progress Notes (Signed)
 Diagnosis: Ulcerative Colitis  Provider:  Eartha Sieving MD  Procedure: IV Infusion  IV Type: Peripheral, IV Location: R Antecubital  Avsola (infliximab-axxq), Dose: 400 mg  Infusion Start Time: 1044  Infusion Stop Time: 1203  Post Infusion IV Care: Observation period completed  Discharge: Condition: Good, Destination: Home . AVS Provided  Performed by:  Blanca Selinda SAUNDERS, LPN

## 2023-09-21 ENCOUNTER — Encounter (INDEPENDENT_AMBULATORY_CARE_PROVIDER_SITE_OTHER): Payer: Self-pay | Admitting: Gastroenterology

## 2023-10-05 ENCOUNTER — Encounter (INDEPENDENT_AMBULATORY_CARE_PROVIDER_SITE_OTHER): Payer: Self-pay | Admitting: *Deleted

## 2023-10-17 DIAGNOSIS — D239 Other benign neoplasm of skin, unspecified: Secondary | ICD-10-CM

## 2023-10-17 HISTORY — DX: Other benign neoplasm of skin, unspecified: D23.9

## 2023-10-18 ENCOUNTER — Ambulatory Visit: Payer: BC Managed Care – PPO | Admitting: Dermatology

## 2023-10-18 ENCOUNTER — Encounter: Payer: Self-pay | Admitting: Dermatology

## 2023-10-18 DIAGNOSIS — W908XXA Exposure to other nonionizing radiation, initial encounter: Secondary | ICD-10-CM | POA: Diagnosis not present

## 2023-10-18 DIAGNOSIS — Z1283 Encounter for screening for malignant neoplasm of skin: Secondary | ICD-10-CM | POA: Diagnosis not present

## 2023-10-18 DIAGNOSIS — L814 Other melanin hyperpigmentation: Secondary | ICD-10-CM

## 2023-10-18 DIAGNOSIS — D492 Neoplasm of unspecified behavior of bone, soft tissue, and skin: Secondary | ICD-10-CM

## 2023-10-18 DIAGNOSIS — L821 Other seborrheic keratosis: Secondary | ICD-10-CM

## 2023-10-18 DIAGNOSIS — D225 Melanocytic nevi of trunk: Secondary | ICD-10-CM | POA: Diagnosis not present

## 2023-10-18 DIAGNOSIS — D229 Melanocytic nevi, unspecified: Secondary | ICD-10-CM

## 2023-10-18 DIAGNOSIS — D692 Other nonthrombocytopenic purpura: Secondary | ICD-10-CM

## 2023-10-18 DIAGNOSIS — Z79899 Other long term (current) drug therapy: Secondary | ICD-10-CM

## 2023-10-18 DIAGNOSIS — D224 Melanocytic nevi of scalp and neck: Secondary | ICD-10-CM

## 2023-10-18 DIAGNOSIS — L578 Other skin changes due to chronic exposure to nonionizing radiation: Secondary | ICD-10-CM | POA: Diagnosis not present

## 2023-10-18 DIAGNOSIS — L718 Other rosacea: Secondary | ICD-10-CM

## 2023-10-18 DIAGNOSIS — L719 Rosacea, unspecified: Secondary | ICD-10-CM

## 2023-10-18 DIAGNOSIS — Z7189 Other specified counseling: Secondary | ICD-10-CM

## 2023-10-18 DIAGNOSIS — L816 Other disorders of diminished melanin formation: Secondary | ICD-10-CM

## 2023-10-18 NOTE — Progress Notes (Signed)
 New Patient Visit   Subjective  Connor Small is a 63 y.o. male who presents for the following: Skin Cancer Screening and Full Body Skin Exam white spots chest, yrs, no symptoms, no hx of skin cancer, no fhx of skin cancer  Patient accompanied by wife  New patient referral from Dr. Toribio Fortune  The patient presents for Total-Body Skin Exam (TBSE) for skin cancer screening and mole check. The patient has spots, moles and lesions to be evaluated, some may be new or changing and the patient may have concern these could be cancer.  The following portions of the chart were reviewed this encounter and updated as appropriate: medications, allergies, medical history  Review of Systems:  No other skin or systemic complaints except as noted in HPI or Assessment and Plan.  Objective  Well appearing patient in no apparent distress; mood and affect are within normal limits.  A full examination was performed including scalp, head, eyes, ears, nose, lips, neck, chest, axillae, abdomen, back, buttocks, bilateral upper extremities, bilateral lower extremities, hands, feet, fingers, toes, fingernails, and toenails. All findings within normal limits unless otherwise noted below.   Relevant physical exam findings are noted in the Assessment and Plan.  Low back spinal above the sacral Dark brown macule 0.6cm   Assessment & Plan   SKIN CANCER SCREENING PERFORMED TODAY.  ACTINIC DAMAGE - Chronic condition, secondary to cumulative UV/sun exposure - diffuse scaly erythematous macules with underlying dyspigmentation - Recommend daily broad spectrum sunscreen SPF 30+ to sun-exposed areas, reapply every 2 hours as needed.  - Staying in the shade or wearing long sleeves, sun glasses (UVA+UVB protection) and wide brim hats (4-inch brim around the entire circumference of the hat) are also recommended for sun protection.  - Call for new or changing lesions.  LENTIGINES, SEBORRHEIC KERATOSES,  HEMANGIOMAS - Benign normal skin lesions - Benign-appearing - Call for any changes  MELANOCYTIC NEVI - Tan-brown and/or pink-flesh-colored symmetric macules and papules - Benign appearing on exam today - Observation - Call clinic for new or changing moles - Recommend daily use of broad spectrum spf 30+ sunscreen to sun-exposed areas.  - L scalp supra auricular 0.3cm tan pap  ROSACEA with Ocular Rosacea Face, eyes mild Exam Mid face erythema with telangiectasias  Chronic and persistent condition with duration or expected duration over one year. Condition is symptomatic / bothersome to patient. Not to goal. Rosacea is a chronic progressive skin condition usually affecting the face of adults, causing redness and/or acne bumps. It is treatable but not curable. It sometimes affects the eyes (ocular rosacea) as well. It may respond to topical and/or systemic medication and can flare with stress, sun exposure, alcohol, exercise, topical steroids (including hydrocortisone /cortisone 10) and some foods.  Daily application of broad spectrum spf 30+ sunscreen to face is recommended to reduce flares.  Patient grittiness of the eyes  Treatment Plan Counseling for BBL / IPL / Laser and Coordination of Care Discussed the treatment option of Broad Band Light (BBL) /Intense Pulsed Light (IPL)/ Laser for skin discoloration, including brown spots and redness.  Typically we recommend at least 1-3 treatment sessions about 5-8 weeks apart for best results.  Cannot have tanned skin when BBL performed, and regular use of sunscreen/photoprotection is advised after the procedure to help maintain results. The patient's condition may also require maintenance treatments in the future.  The fee for BBL / laser treatments is $350 per treatment session for the whole face.  A fee can  be quoted for other parts of the body.  Insurance typically does not pay for BBL/laser treatments and therefore the fee is an out-of-pocket  cost. Recommend prophylactic valtrex treatment. Once scheduled for procedure, will send Rx in prior to patient's appointment.   Idiopathic Guttate Hypomelanosis (IGH) Arms, chest Exam: scattered small hypopigmented macules IGH is a benign chronic condition of sun-exposed skin, most commonly affecting the arms and legs. Cause is unknown but it can be a genetic trait. It is not related to vitiligo. There is no treatment. Recommend photoprotection and regular use of spf 30 or higher sunscreen which may help prevent the development of more white spots. Treatment Plan:  Benign, observe.     Purpura - Chronic; persistent and recurrent.  Treatable, but not curable. arms - Violaceous macules and patches - Benign - Related to trauma, age, sun damage and/or use of blood thinners, chronic use of topical and/or oral steroids - Observe - Can use OTC arnica containing moisturizer such as Dermend Bruise Formula if desired - Call for worsening or other concerns  NEOPLASM OF SKIN Low back spinal above the sacral Epidermal / dermal shaving  Lesion diameter (cm):  0.6 Informed consent: discussed and consent obtained   Timeout: patient name, date of birth, surgical site, and procedure verified   Procedure prep:  Patient was prepped and draped in usual sterile fashion Prep type:  Isopropyl alcohol Anesthesia: the lesion was anesthetized in a standard fashion   Anesthetic:  1% lidocaine  w/ epinephrine 1-100,000 buffered w/ 8.4% NaHCO3 Instrument used: flexible razor blade   Hemostasis achieved with: pressure, aluminum chloride and electrodesiccation   Outcome: patient tolerated procedure well   Post-procedure details: sterile dressing applied and wound care instructions given   Dressing type: bandage (mupirocin )    Specimen 1 - Surgical pathology Differential Diagnosis: Nevus vs Dysplastic Nevus  Check Margins: yes Dark brown macule 0.6cm  Return in about 1 year (around 10/17/2024) for TBSE.  I,  Grayce Saunas, RMA, am acting as scribe for Alm Rhyme, MD .   Documentation: I have reviewed the above documentation for accuracy and completeness, and I agree with the above.  Alm Rhyme, MD

## 2023-10-18 NOTE — Patient Instructions (Addendum)
 Wound Care Instructions  Cleanse wound gently with soap and water  once a day then pat dry with clean gauze. Apply a thin coat of Petrolatum (petroleum jelly, Vaseline) over the wound (unless you have an allergy to this). We recommend that you use a new, sterile tube of Vaseline. Do not pick or remove scabs. Do not remove the yellow or white healing tissue from the base of the wound.  Cover the wound with fresh, clean, nonstick gauze and secure with paper tape. You may use Band-Aids in place of gauze and tape if the wound is small enough, but would recommend trimming much of the tape off as there is often too much. Sometimes Band-Aids can irritate the skin.  You should call the office for your biopsy report after 1 week if you have not already been contacted.  If you experience any problems, such as abnormal amounts of bleeding, swelling, significant bruising, significant pain, or evidence of infection, please call the office immediately.  FOR ADULT SURGERY PATIENTS: If you need something for pain relief you may take 1 extra strength Tylenol  (acetaminophen ) AND 2 Ibuprofen (200mg  each) together every 4 hours as needed for pain. (do not take these if you are allergic to them or if you have a reason you should not take them.) Typically, you may only need pain medication for 1 to 3 days.   Due to recent changes in healthcare laws, you may see results of your pathology and/or laboratory studies on MyChart before the doctors have had a chance to review them. We understand that in some cases there may be results that are confusing or concerning to you. Please understand that not all results are received at the same time and often the doctors may need to interpret multiple results in order to provide you with the best plan of care or course of treatment. Therefore, we ask that you please give us  2 business days to thoroughly review all your results before contacting the office for clarification. Should we  see a critical lab result, you will be contacted sooner.   If You Need Anything After Your Visit  If you have any questions or concerns for your doctor, please call our main line at [phone] and press option 4 to reach your doctor's medical assistant. If no one answers, please leave a voicemail as directed and we will return your call as soon as possible. Messages left after 4 pm will be answered the following business day.   You may also send us  a message via MyChart. We typically respond to MyChart messages within 1-2 business days.  For prescription refills, please ask your pharmacy to contact our office. Our fax number is [phone].  If you have an urgent issue when the clinic is closed that cannot wait until the next business day, you can page your doctor at the number below.    Please note that while we do our best to be available for urgent issues outside of office hours, we are not available 24/7.   If you have an urgent issue and are unable to reach us , you may choose to seek medical care at your doctor's office, retail clinic, urgent care center, or emergency room.  If you have a medical emergency, please immediately call 911 or go to the emergency department.  Pager Numbers  - Dr. Hester: [phone]  - Dr. Jackquline: [phone]  - Dr. Claudene: [phone]   - Dr. Raymund: [phone]  In the event of inclement weather, please call  our main line at [phone] for an update on the status of any delays or closures.  Dermatology Medication Tips: Please keep the boxes that topical medications come in in order to help keep track of the instructions about where and how to use these. Pharmacies typically print the medication instructions only on the boxes and not directly on the medication tubes.   If your medication is too expensive, please contact our office at [phone] option 4 or send us  a message through MyChart.   We are unable to tell what your co-pay for  medications will be in advance as this is different depending on your insurance coverage. However, we may be able to find a substitute medication at lower cost or fill out paperwork to get insurance to cover a needed medication.   If a prior authorization is required to get your medication covered by your insurance company, please allow us  1-2 business days to complete this process.  Drug prices often vary depending on where the prescription is filled and some pharmacies may offer cheaper prices.  The website www.goodrx.com contains coupons for medications through different pharmacies. The prices here do not account for what the cost may be with help from insurance (it may be cheaper with your insurance), but the website can give you the price if you did not use any insurance.  - You can print the associated coupon and take it with your prescription to the pharmacy.  - You may also stop by our office during regular business hours and pick up a GoodRx coupon card.  - If you need your prescription sent electronically to a different pharmacy, notify our office through [facility] or by phone at [phone] option 4.     Si Usted Necesita Algo Despus de Su Visita  Tambin puede enviarnos un mensaje a travs de Clinical cytogeneticist. Por lo general respondemos a los mensajes de MyChart en el transcurso de 1 a 2 das hbiles.  Para renovar recetas, por favor pida a su farmacia que se ponga en contacto con nuestra oficina. Randi lakes de fax es [place] [phone].  Si tiene un asunto urgente cuando la clnica est cerrada y que no puede esperar hasta el siguiente da hbil, puede llamar/localizar a su doctor(a) al nmero que aparece a continuacin.   Por favor, tenga en cuenta que aunque hacemos todo lo posible para estar disponibles para asuntos urgentes fuera del horario de [place], no estamos disponibles las 24 horas del da, los 7 [address]  [address] de la [place].   Si tiene un problema urgente y no puede  comunicarse con nosotros, puede optar por buscar atencin mdica  en el consultorio de su doctor(a), en una clnica privada, en un centro de atencin urgente o en una sala de emergencias.  Si tiene Engineer, drilling, por favor llame inmediatamente al 911 o vaya a la sala de emergencias.  Nmeros de bper  - Dr. Hester: [phone]  - Dra. Jackquline: [phone]  - Dr. Claudene: [phone]  - Dra. Kitts: [phone]  En caso de inclemencias del [place], por favor llame a nuestra lnea principal al [phone] para una actualizacin sobre el estado de cualquier retraso o cierre.  Consejos para la medicacin en dermatologa: Por favor, guarde las cajas en las que vienen los medicamentos de uso tpico para ayudarle a seguir las instrucciones sobre dnde y cmo usarlos. Las farmacias generalmente imprimen las instrucciones del medicamento slo en las cajas y no directamente en los tubos del [place].   Si su medicamento  es muy caro, por favor, pngase en contacto con landry rieger llamando al [phone] y presione la opcin 4 o envenos un mensaje a travs de Clinical cytogeneticist.   No podemos decirle cul ser su copago por los medicamentos por adelantado ya que esto es diferente dependiendo de la cobertura de su seguro. Sin embargo, es posible que podamos encontrar un medicamento sustituto a Audiological scientist un formulario para que el seguro cubra el medicamento que se considera necesario.   Si se requiere una autorizacin previa para que su compaa de seguros malta su medicamento, por favor permtanos de 1 a 2 das hbiles para completar este proceso.  Los precios de los medicamentos varan con frecuencia dependiendo del Environmental consultant de dnde se surte la receta y alguna farmacias pueden ofrecer precios ms baratos.  El sitio web www.goodrx.com tiene cupones para medicamentos de Health and safety inspector. Los precios aqu no tienen en cuenta lo que podra costar con la ayuda del seguro (puede ser ms  barato con su seguro), pero el sitio web puede darle el precio si no utiliz Tourist information centre manager.  - Puede imprimir el cupn correspondiente y llevarlo con su receta a la farmacia.  - Tambin puede pasar por nuestra oficina durante el horario de atencin regular y Education officer, museum una tarjeta de cupones de GoodRx.  - Si necesita que su receta se enve electrnicamente a una farmacia diferente, informe a nuestra oficina a travs de MyChart de [place] o por telfono llamando al [phone] y presione la opcin 4.

## 2023-10-20 ENCOUNTER — Encounter: Attending: Gastroenterology | Admitting: Internal Medicine

## 2023-10-20 VITALS — BP 138/87 | HR 52 | Temp 98.0°F | Resp 16 | Wt 187.0 lb

## 2023-10-20 DIAGNOSIS — K51019 Ulcerative (chronic) pancolitis with unspecified complications: Secondary | ICD-10-CM | POA: Diagnosis not present

## 2023-10-20 LAB — SURGICAL PATHOLOGY

## 2023-10-20 MED ORDER — SODIUM CHLORIDE 0.9 % IV SOLN
5.0000 mg/kg | Freq: Once | INTRAVENOUS | Status: AC
  Administered 2023-10-20: 400 mg via INTRAVENOUS
  Filled 2023-10-20: qty 40

## 2023-10-20 MED ORDER — ACETAMINOPHEN 325 MG PO TABS: 650.0000 mg | ORAL_TABLET | Freq: Once | ORAL | Status: DC

## 2023-10-20 MED ORDER — METHYLPREDNISOLONE SODIUM SUCC 40 MG IJ SOLR
40.0000 mg | Freq: Once | INTRAMUSCULAR | Status: AC
  Administered 2023-10-20: 40 mg via INTRAVENOUS

## 2023-10-20 MED ORDER — DIPHENHYDRAMINE HCL 25 MG PO CAPS: 25.0000 mg | ORAL_CAPSULE | Freq: Once | ORAL | Status: DC

## 2023-10-20 NOTE — Progress Notes (Signed)
 Diagnosis: Ulcerative Colitis  Provider:  Eartha Sieving MD  Procedure: IV Infusion  IV Type: Peripheral, IV Location: R Hand  Avsola  (infliximab -axxq), Dose: 400 mg  Infusion Start Time: 1035  Infusion Stop Time: 1143  Post Infusion IV Care: Observation period completed  Discharge: Condition: Good, Destination: Home . AVS Provided  Performed by:  Blanca Selinda SAUNDERS, LPN

## 2023-10-24 ENCOUNTER — Encounter: Payer: Self-pay | Admitting: Dermatology

## 2023-10-24 ENCOUNTER — Ambulatory Visit: Payer: Self-pay | Admitting: Dermatology

## 2023-10-24 NOTE — Telephone Encounter (Addendum)
 Called and discussed bx results with patient's wife and scheduled patient for 6 month follow up to recheck. Patient's wife states they had reviewed results on mychart and did not have any questions.   ----- Message from Connor Small sent at 10/24/2023  1:38 PM EDT ----- FINAL DIAGNOSIS        1. Skin, low back spinal above the sacral :       JUNCTIONAL DYSPLASTIC MELANOCYTIC NEVUS WITH MODERATE TO SEVERE ATYPIA, CLOSE TO       MARGIN, SEE DESCRIPTION    Moderate to Severe Dsyplastic Margins clear, but CLOSE TO MARGIN May need additional procedure Recheck 6 mos.  Make pt appt for 6 mos. ----- Message ----- From: Interface, Lab In [place] Sent: 10/20/2023   3:46 PM EDT To: Connor JAYSON Rhyme, MD

## 2023-10-31 ENCOUNTER — Ambulatory Visit (INDEPENDENT_AMBULATORY_CARE_PROVIDER_SITE_OTHER): Admitting: Gastroenterology

## 2023-11-08 ENCOUNTER — Encounter (INDEPENDENT_AMBULATORY_CARE_PROVIDER_SITE_OTHER): Payer: Self-pay | Admitting: Gastroenterology

## 2023-11-13 ENCOUNTER — Ambulatory Visit (INDEPENDENT_AMBULATORY_CARE_PROVIDER_SITE_OTHER): Admitting: Gastroenterology

## 2023-11-13 ENCOUNTER — Encounter (INDEPENDENT_AMBULATORY_CARE_PROVIDER_SITE_OTHER): Payer: Self-pay | Admitting: Gastroenterology

## 2023-11-13 ENCOUNTER — Telehealth (INDEPENDENT_AMBULATORY_CARE_PROVIDER_SITE_OTHER): Payer: Self-pay

## 2023-11-13 VITALS — BP 127/75 | HR 59 | Temp 97.8°F | Ht 70.0 in | Wt 186.8 lb

## 2023-11-13 DIAGNOSIS — K51219 Ulcerative (chronic) proctitis with unspecified complications: Secondary | ICD-10-CM | POA: Diagnosis not present

## 2023-11-13 NOTE — Telephone Encounter (Signed)
 Per carelon, no prior auth required.

## 2023-11-13 NOTE — Telephone Encounter (Signed)
 Spoke with patient here in the office, scheduled flex sig for tomorrow at 8:30am. Instructions given to the patient in hand.

## 2023-11-13 NOTE — Progress Notes (Signed)
 Connor Small, M.D. Gastroenterology & Hepatology [facility] [address] [place], [place] [address]  Primary Care Physician: Sophronia Ozell BROCKS, MD [address] [place] [id number]  I will communicate my assessment and recommendations to the referring MD via EMR.  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  The patient was last seen on 04/06/2023. At that time, the patient was continued on Avsola  5 mg/kg every 6 weeks.  Also continue mesalamine  suppositories every night.  Advised to obtain shingles and pneumonia vaccination.  Patient denies any complaints. Moves bowels 6-7 times a day, which is his usual. Consistency can change depending on what he eats. The patient denies having any nausea, vomiting, fever, chills, hematochezia, melena, hematemesis, abdominal distention, abdominal pain, diarrhea, jaundice, pruritus or weight loss.  Last flu shot: 2025 Last pneumonia shot:2025 Last evaluation by dermatology: upcoming appointment inSeptember 2025 Last zoster vaccine: never COVID-19 shot: 2 initial doses - Moderna Last tuberculosis/hepatitis B screening: 06/09/2023.   Last flex sig: 11/02/2022 - The terminal ileum is normal. 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 Dilated. - Moderately active ( Mayo Score 2) proctitis ulcerative colitis, worsened since the last examination. Biopsied.  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   Dysplastic nevus 10/17/2023   low back spinal above the sacral - moderate to severe - may need additional procedure recheck at next follow up   HAP (hospital-acquired pneumonia) 04/21/2012   Hemorrhoids    History of acute renal failure    post-op  total colectomy 02-27-2012--  required dialysis--  resolved   History of ARDS    02-27-2012  post op total colectomy -- septic shock-  required intubation/ventilator for 3-4 wks   History of atrial fibrillation    02/ 2014--  no issues since   History of Clostridium difficile colitis    02-27-2012--  fulminant C.Diff colitis caused toxic megacolon--  s/p  total colectomy   History of DVT (deep vein thrombosis)    2014--  right upper extremity   History of kidney stones    History of pleural effusion    acute- bilateral in setting septic shock post op Feb 2014   History of septic shock dx's at this time--  ARDS, ARF, CVA, A-Fib   Feb 2014--  post op total colectomy from toxic megacolon/ fulminant C.Diff Colitis   History of stroke March 2014 --  residual right weaker than prior to cva, unable to make fist   Bilateral cerebral and right cerebellar infarcts - felt to be embolic vs hypotensive (in setting of septic shock, transient a-fib, ARDS)   PAF (paroxysmal atrial fibrillation) (HCC) 03/15/2012   Rectal bleed    Renal calculi    bilateral--  left > right   Ulcerative colitis followed by dr golda   Distal UC dx 2004   Urgency of urination     Past Surgical History: Past Surgical History:  Procedure Laterality Date  APPLICATION OF WOUND VAC N/A 03/26/2012   Procedure: APPLICATION OF WOUND VAC;  Surgeon: Morene ONEIDA Olives, MD;  [facility]: [facility];  Service: General;  Laterality: N/A;   BALLOON DILATION N/A 01/15/2018   Procedure: MERRILL HODGKIN;  Surgeon: Golda Claudis PENNER, MD;  [facility];  Service: Endoscopy;  Laterality: N/A;  ileorectum    BALLOON DILATION N/A 09/30/2020   Procedure: BALLOON DILATION;  Surgeon: Golda Claudis PENNER, MD;  [facility];  Service: Endoscopy;  Laterality: N/A;  colon   BALLOON DILATION  11/02/2022   Procedure: BALLOON DILATION;  Surgeon: Eartha Angelia Sieving, MD;  [facility];  Service: Gastroenterology;;   BIOPSY  01/19/2017   Procedure: BIOPSY;  Surgeon: Golda Claudis PENNER, MD;  [facility];  Service: Endoscopy;;  rectal   BIOPSY  11/02/2022   Procedure: BIOPSY;  Surgeon: Eartha Angelia Sieving, MD;  [facility];  Service: Gastroenterology;;   CECOSTOMY  02/27/2012   Procedure: CECOSTOMY;  Surgeon: Thresa JAYSON Pulling, MD;  [facility]: [facility];  Service: General;  Laterality: N/A;  Cecostomy Tube Placement   COLECTOMY  02/28/2012   Procedure: TOTAL COLECTOMY;  Surgeon: Thresa JAYSON Pulling, MD;  [facility]: [facility];  Service: General;  Laterality: N/A;   COLONOSCOPY  02/16/2011   Procedure: COLONOSCOPY;  Surgeon: Claudis PENNER Golda, MD;  [facility];  Service: Endoscopy;  Laterality: N/A;  100   CYSTOSCOPY W/ URETERAL STENT PLACEMENT Bilateral 03/20/2012   Procedure: CYSTOSCOPY WITH RETROGRADE PYELOGRAM/;  Surgeon: Ricardo Likens, MD;  Location[facility] OR;  Service: Urology;  Laterality: Bilateral. Pt reports no stents were placed.   CYSTOSCOPY WITH RETROGRADE PYELOGRAM, URETEROSCOPY AND STENT PLACEMENT Left 07/16/2014   Procedure: CYSTOSCOPY WITH RETROGRADE PYELOGRAM,  AND LEFT STENT PLACEMENT;  Surgeon: Ricardo Likens, MD;  Location: [facility];  Service: Urology;  Laterality: Left;   CYSTOSCOPY WITH RETROGRADE PYELOGRAM, URETEROSCOPY AND STENT PLACEMENT Bilateral 07/25/2014   Procedure: CYSTOSCOPY WITH RETROGRADE PYELOGRAM, URETEROSCOPY AND BILATERAL STENT PLACEMENT;  Surgeon: Ricardo Likens, MD;  Location: [facility];  Service: Urology;  Laterality: Bilateral;   ESOPHAGOGASTRODUODENOSCOPY N/A 01/14/2016   Procedure: ESOPHAGOGASTRODUODENOSCOPY (EGD);   Surgeon: Claudis PENNER Golda, MD;  [facility];  Service: Endoscopy;  Laterality: N/A;   FLEXIBLE SIGMOIDOSCOPY  02/27/2012   Procedure: FLEXIBLE SIGMOIDOSCOPY;  Surgeon: Claudis PENNER Golda, MD;  [facility];  Service: Endoscopy;  Laterality: N/A;  with colonic decompression   FLEXIBLE SIGMOIDOSCOPY N/A 02/06/2013   Procedure: FLEXIBLE SIGMOIDOSCOPY;  Surgeon: Claudis PENNER Golda, MD;  [facility];  Service: Endoscopy;  Laterality: N/A;  100-moved to 1200 Ann notified pt   FLEXIBLE SIGMOIDOSCOPY N/A 03/27/2013   Procedure: FLEXIBLE SIGMOIDOSCOPY;  Surgeon: Claudis PENNER Golda, MD;  [facility];  Service: Endoscopy;  Laterality: N/A;  225   FLEXIBLE SIGMOIDOSCOPY N/A 12/11/2013   Procedure: FLEXIBLE SIGMOIDOSCOPY;  Surgeon: Claudis PENNER Golda, MD;  [facility];  Service: Endoscopy;  Laterality: N/A;  200   FLEXIBLE SIGMOIDOSCOPY N/A 01/07/2015   Procedure: FLEXIBLE SIGMOIDOSCOPY;  Surgeon: Claudis PENNER Golda, MD;  [facility];  Service: Endoscopy;  Laterality: N/A;  830   FLEXIBLE SIGMOIDOSCOPY N/A 01/14/2016   Procedure: FLEXIBLE SIGMOIDOSCOPY;  Surgeon: Claudis PENNER Golda, MD;  [facility];  Service: Endoscopy;  Laterality: N/A;   FLEXIBLE SIGMOIDOSCOPY N/A 01/19/2017   Procedure: FLEXIBLE SIGMOIDOSCOPY;  Surgeon: Golda Claudis PENNER, MD;  [facility];  Service:  Endoscopy;  Laterality: N/A;  1200   FLEXIBLE SIGMOIDOSCOPY N/A 01/15/2018   Procedure: FLEXIBLE SIGMOIDOSCOPY;  Surgeon: Golda Claudis PENNER, MD;  [facility];  Service: Endoscopy;  Laterality: N/A;  9:30   FLEXIBLE SIGMOIDOSCOPY N/A 10/04/2019   Procedure: FLEXIBLE SIGMOIDOSCOPY;  Surgeon: Golda Claudis PENNER, MD;  [facility];  Service: Endoscopy;  Laterality: N/A;  730   FLEXIBLE SIGMOIDOSCOPY N/A 09/30/2020   Procedure: FLEXIBLE SIGMOIDOSCOPY;  Surgeon: Golda Claudis PENNER, MD;  [facility];  Service: Endoscopy;  Laterality: N/A;  10:05    FLEXIBLE SIGMOIDOSCOPY N/A 11/02/2022   Procedure: FLEXIBLE SIGMOIDOSCOPY;  Surgeon: Eartha Angelia Sieving, MD;  [facility];  Service: Gastroenterology;  Laterality: N/A;  10:30AM;ASA 1   HOLMIUM LASER APPLICATION Left 07/25/2014   Procedure: HOLMIUM LASER APPLICATION, LEFT ONLY ;  Surgeon: Ricardo Likens, MD;  Location: [facility];  Service: Urology;  Laterality: Left;   ILEOSTOMY  02/28/2012   Procedure: ILEOSTOMY;  Surgeon: Thresa JAYSON Pulling, MD;  [facility]: [facility];  Service: General;  Laterality: N/A;   ILEOSTOMY CLOSURE N/A 05/27/2013   Procedure: ILEOSTOMY TAKEDOWN WITH ILEOPROCTOSTOMY;  Surgeon: Morene ONEIDA Olives, MD;  Location: [facility];  Service: General;  Laterality: N/A;   LAPAROTOMY  02/27/2012   Procedure: EXPLORATORY LAPAROTOMY;  Surgeon: Thresa JAYSON Pulling, MD;  [facility]: [facility];  Service: General;  Laterality: N/A;   LAPAROTOMY N/A 03/26/2012   Procedure: EXPLORATORY LAPAROTOMY;  Surgeon: Morene ONEIDA Olives, MD;  [facility];  Service: General;  Laterality: N/A;   LAPAROTOMY N/A 03/29/2012   Procedure: EXPLORATORY LAPAROTOMY, PARTIAL WOUND CLOSURE;  Surgeon: Morene ONEIDA Olives, MD;  [facility];  Service: General;  Laterality: N/A;   LIVER BIOPSY N/A 03/26/2012   Procedure: LIVER BIOPSY;  Surgeon: Morene ONEIDA Olives, MD;  [facility];  Service: General;  Laterality: N/A;   POLYPECTOMY  10/04/2019   Procedure: POLYPECTOMY;  Surgeon: Golda Claudis PENNER, MD;  [facility];  Service: Endoscopy;;   SIGMOID COLECTOMY /  LOW ANTERIOR RESECTION/  END SIGMOID COLOSTOMY  11-07-2010   [place]   Sigmoid colonic perferation---  TAKE DOWN COLOSTOMY,  Jan 2013   TRANSTHORACIC ECHOCARDIOGRAM  03-26-2012  (in setting of septic shock)   report states  poorly visualized--  normal LVSF, ef 50-55% and no pericardial effusion   VACUUM ASSISTED CLOSURE CHANGE N/A 03/29/2012   Procedure: Open ABDOMINAL VACUUM  CHANGE;  Surgeon: Morene ONEIDA Olives, MD;  [facility];  Service: General;  Laterality: N/A;   VACUUM ASSISTED CLOSURE CHANGE N/A 04/01/2012   Procedure: removal of abdominal vac dressing and abdominal closure;  Surgeon: Morene ONEIDA Olives, MD;  Location: [facility];  Service: General;  Laterality: N/A;    Family History: Family History  Problem Relation Age of Onset   Cancer Sister    Healthy Daughter    Hypertension Mother    Colon cancer Neg Hx     Social History: Social History   Tobacco Use  Smoking Status Former   Current packs/day: 0.00   Average packs/day: 0.5 packs/day for 12.0 years (6.0 ttl pk-yrs)   Types: Cigarettes   Start date: 07/17/1972   Quit date: 07/17/1984   Years since quitting: 39.3   Passive exposure: Never  Smokeless Tobacco Never   Social History   Substance and Sexual Activity  Alcohol Use No   Alcohol/week: 0.0 standard drinks of alcohol   Social History   Substance and Sexual  Activity  Drug Use No    Allergies: Allergies  Allergen Reactions   Penicillins Other (See Comments)    Heart rate slowed down  bradycardia DID THE REACTION INVOLVE: Swelling of the face/tongue/throat, SOB, or low BP? Yes Sudden or severe rash/hives, skin peeling, or the inside of the mouth or nose? No Did it require medical treatment? Yes When did it last happen?      years  If all above answers are "NO", may proceed with cephalosporin use.     Clindamycin /Lincomycin Diarrhea    C-diff   Morphine  And Codeine Nausea And Vomiting   Claritin  [Loratadine ] Rash   Contrast Media [Iodinated Contrast Media] Hives    Patient reports hives after IV contrast.    Primaxin  [Imipenem ] Rash    Medications: Current Outpatient Medications  Medication Sig Dispense Refill   acetaminophen  (TYLENOL ) 500 MG tablet Take 1,000 mg by mouth every 6 (six) hours as needed for moderate pain or headache.      Calcium  Citrate (CITRACAL PO) Take by mouth. (Patient taking differently: Take by mouth daily at 6 (six) AM.)      diphenhydrAMINE  (BENADRYL ) 25 MG tablet Take 25 mg by mouth daily as needed. Take before Avsola  infusion     ferrous sulfate  325 (65 FE) MG tablet Take 325 mg by mouth 2 (two) times daily with a meal.     fluticasone  (FLONASE ) 50 MCG/ACT nasal spray Place 1 spray into both nostrils daily as needed for allergies or rhinitis. (Patient taking differently: Place 1 spray into both nostrils as needed for allergies or rhinitis.)     inFLIXimab -axxq (AVSOLA ) 100 MG injection Inject into the vein. (Patient taking differently: Inject into the vein. Every six weeks.)     mesalamine  (CANASA ) 1000 MG suppository Place 1 suppository (1,000 mg total) rectally at bedtime. 30 suppository 3   Multiple Vitamin (MULTIVITAMIN WITH MINERALS) TABS tablet Take 1 tablet by mouth daily.     vitamin C (ASCORBIC ACID) 500 MG tablet Take 500 mg by mouth daily.     No current facility-administered medications for this visit.    Review of Systems: GENERAL: negative for malaise, night sweats HEENT: No changes in hearing or vision, no nose bleeds or other nasal problems. NECK: Negative for lumps, goiter, pain and significant neck swelling RESPIRATORY: Negative for cough, wheezing CARDIOVASCULAR: Negative for chest pain, leg swelling, palpitations, orthopnea GI: SEE HPI MUSCULOSKELETAL: Negative for joint pain or swelling, back pain, and muscle pain. SKIN: Negative for lesions, rash PSYCH: Negative for sleep disturbance, mood disorder and recent psychosocial stressors. HEMATOLOGY Negative for prolonged bleeding, bruising easily, and swollen nodes. ENDOCRINE: Negative for cold or heat intolerance, polyuria, polydipsia and goiter. NEURO: negative for tremor, gait imbalance, syncope and seizures. The remainder of the review of systems is noncontributory.   Physical Exam: BP 127/75 (BP Location: Left Arm, Patient Position: Sitting, Cuff Size: Normal)   Pulse (!) 59   Temp 97.8 F (36.6 C) (Temporal)   Ht 5' 10 (1.778 m)    Wt 186 lb 12.8 oz (84.7 kg)   BMI 26.80 kg/m  GENERAL: The patient is AO x3, in no acute distress. HEENT: Head is normocephalic and atraumatic. EOMI are intact. Mouth is well hydrated and without lesions. NECK: Supple. No masses LUNGS: Clear to auscultation. No presence of rhonchi/wheezing/rales. Adequate chest expansion HEART: RRR, normal s1 and s2. ABDOMEN: Soft, nontender, no guarding, no peritoneal signs, and nondistended. BS +. No masses. EXTREMITIES: Without any cyanosis, clubbing, rash, lesions or  edema. NEUROLOGIC: AOx3, no focal motor deficit. SKIN: no jaundice, no rashes  Imaging/Labs: as above  I personally reviewed and interpreted the available labs, imaging and endoscopic files.  Impression and Plan: Connor Small is a 63 y.o. 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  Patient has presented significant symptom control with the combination of Avsola  and mesalamine  suppositories.  Denies any ongoing complaints at the moment.  He appears to be in endoscopic remission with this combination of therapy.  He is due for repeat flexible sigmoidoscopy, which will be performed for preventative purposes.  She will be scheduled for tomorrow.  For now, the patient will be continuing the same doses of Avsola  and mesalamine .  Repeat surveillance labs will be ordered today.  Finally, he is due for shingles vaccination, I encouraged him to proceed with this.  -Proceed with flexible sigmoidoscopy tomorrow -Continue mesalamine  suppositories overnight -Continue Avsola  every 6 weeks -Please obtain shingles shot - Check CBC, CMP and CRP.  All questions were answered.      Connor Fortune, MD Gastroenterology and Hepatology [facility]

## 2023-11-13 NOTE — H&P (View-Only) (Signed)
 Connor Small, M.D. Gastroenterology & Hepatology [facility] [address] [place], [place] [address]  Primary Care Physician: Sophronia Ozell BROCKS, MD [address] [place] [address]  I will communicate my assessment and recommendations to the referring MD via EMR.  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  The patient was last seen on 04/06/2023. At that time, the patient was continued on Avsola  5 mg/kg every 6 weeks.  Also continue mesalamine  suppositories every night.  Advised to obtain shingles and pneumonia vaccination.  Patient denies any complaints. Moves bowels 6-7 times a day, which is his usual. Consistency can change depending on what he eats. The patient denies having any nausea, vomiting, fever, chills, hematochezia, melena, hematemesis, abdominal distention, abdominal pain, diarrhea, jaundice, pruritus or weight loss.  Last flu shot: 2025 Last pneumonia shot:2025 Last evaluation by dermatology: upcoming appointment inSeptember 2025 Last zoster vaccine: never COVID-19 shot: 2 initial doses - Moderna Last tuberculosis/hepatitis B screening: 06/09/2023.   Last flex sig: 11/02/2022 - The terminal ileum is normal. 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 Dilated. - Moderately active ( Mayo Score 2) proctitis ulcerative colitis, worsened since the last examination. Biopsied.  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   Dysplastic nevus 10/17/2023   low back spinal above the sacral - moderate to severe - may need additional procedure recheck at next follow up   HAP (hospital-acquired pneumonia) 04/21/2012   Hemorrhoids    History of acute renal failure    post-op  total colectomy 02-27-2012--  required dialysis--  resolved   History of ARDS    02-27-2012  post op total colectomy -- septic shock-  required intubation/ventilator for 3-4 wks   History of atrial fibrillation    02/ 2014--  no issues since   History of Clostridium difficile colitis    02-27-2012--  fulminant C.Diff colitis caused toxic megacolon--  s/p  total colectomy   History of DVT (deep vein thrombosis)    2014--  right upper extremity   History of kidney stones    History of pleural effusion    acute- bilateral in setting septic shock post op Feb 2014   History of septic shock dx's at this time--  ARDS, ARF, CVA, A-Fib   Feb 2014--  post op total colectomy from toxic megacolon/ fulminant C.Diff Colitis   History of stroke March 2014 --  residual right weaker than prior to cva, unable to make fist   Bilateral cerebral and right cerebellar infarcts - felt to be embolic vs hypotensive (in setting of septic shock, transient a-fib, ARDS)   PAF (paroxysmal atrial fibrillation) (HCC) 03/15/2012   Rectal bleed    Renal calculi    bilateral--  left > right   Ulcerative colitis followed by dr golda   Distal UC dx 2004   Urgency of urination     Past Surgical History: Past Surgical History:  Procedure Laterality Date  APPLICATION OF WOUND VAC N/A 03/26/2012   Procedure: APPLICATION OF WOUND VAC;  Surgeon: Morene ONEIDA Olives, MD;  [facility]: [facility];  Service: General;  Laterality: N/A;   BALLOON DILATION N/A 01/15/2018   Procedure: MERRILL HODGKIN;  Surgeon: Golda Claudis PENNER, MD;  [facility];  Service: Endoscopy;  Laterality: N/A;  ileorectum    BALLOON DILATION N/A 09/30/2020   Procedure: BALLOON DILATION;  Surgeon: Golda Claudis PENNER, MD;  [facility];  Service: Endoscopy;  Laterality: N/A;  colon   BALLOON DILATION  11/02/2022   Procedure: BALLOON DILATION;  Surgeon: Eartha Angelia Sieving, MD;  [facility];  Service: Gastroenterology;;   BIOPSY  01/19/2017   Procedure: BIOPSY;  Surgeon: Golda Claudis PENNER, MD;  [facility];  Service: Endoscopy;;  rectal   BIOPSY  11/02/2022   Procedure: BIOPSY;  Surgeon: Eartha Angelia Sieving, MD;  [facility];  Service: Gastroenterology;;   CECOSTOMY  02/27/2012   Procedure: CECOSTOMY;  Surgeon: Thresa JAYSON Pulling, MD;  Location: [facility];  Service: General;  Laterality: N/A;  Cecostomy Tube Placement   COLECTOMY  02/28/2012   Procedure: TOTAL COLECTOMY;  Surgeon: Thresa JAYSON Pulling, MD;  Location: [facility];  Service: General;  Laterality: N/A;   COLONOSCOPY  02/16/2011   Procedure: COLONOSCOPY;  Surgeon: Claudis PENNER Golda, MD;  [facility];  Service: Endoscopy;  Laterality: N/A;  100   CYSTOSCOPY W/ URETERAL STENT PLACEMENT Bilateral 03/20/2012   Procedure: CYSTOSCOPY WITH RETROGRADE PYELOGRAM/;  Surgeon: Ricardo Likens, MD;  Location[facility] OR;  Service: Urology;  Laterality: Bilateral. Pt reports no stents were placed.   CYSTOSCOPY WITH RETROGRADE PYELOGRAM, URETEROSCOPY AND STENT PLACEMENT Left 07/16/2014   Procedure: CYSTOSCOPY WITH RETROGRADE PYELOGRAM,  AND LEFT STENT PLACEMENT;  Surgeon: Ricardo Likens, MD;  Location: [facility];  Service: Urology;  Laterality: Left;   CYSTOSCOPY WITH RETROGRADE PYELOGRAM, URETEROSCOPY AND STENT PLACEMENT Bilateral 07/25/2014   Procedure: CYSTOSCOPY WITH RETROGRADE PYELOGRAM, URETEROSCOPY AND BILATERAL STENT PLACEMENT;  Surgeon: Ricardo Likens, MD;  Location: [facility];  Service: Urology;  Laterality: Bilateral;   ESOPHAGOGASTRODUODENOSCOPY N/A 01/14/2016   Procedure: ESOPHAGOGASTRODUODENOSCOPY (EGD);   Surgeon: Claudis PENNER Golda, MD;  [facility];  Service: Endoscopy;  Laterality: N/A;   FLEXIBLE SIGMOIDOSCOPY  02/27/2012   Procedure: FLEXIBLE SIGMOIDOSCOPY;  Surgeon: Claudis PENNER Golda, MD;  [facility];  Service: Endoscopy;  Laterality: N/A;  with colonic decompression   FLEXIBLE SIGMOIDOSCOPY N/A 02/06/2013   Procedure: FLEXIBLE SIGMOIDOSCOPY;  Surgeon: Claudis PENNER Golda, MD;  [facility];  Service: Endoscopy;  Laterality: N/A;  100-moved to 1200 Ann notified pt   FLEXIBLE SIGMOIDOSCOPY N/A 03/27/2013   Procedure: FLEXIBLE SIGMOIDOSCOPY;  Surgeon: Claudis PENNER Golda, MD;  [facility];  Service: Endoscopy;  Laterality: N/A;  225   FLEXIBLE SIGMOIDOSCOPY N/A 12/11/2013   Procedure: FLEXIBLE SIGMOIDOSCOPY;  Surgeon: Claudis PENNER Golda, MD;  [facility];  Service: Endoscopy;  Laterality: N/A;  200   FLEXIBLE SIGMOIDOSCOPY N/A 01/07/2015   Procedure: FLEXIBLE SIGMOIDOSCOPY;  Surgeon: Claudis PENNER Golda, MD;  [facility];  Service: Endoscopy;  Laterality: N/A;  830   FLEXIBLE SIGMOIDOSCOPY N/A 01/14/2016   Procedure: FLEXIBLE SIGMOIDOSCOPY;  Surgeon: Claudis PENNER Golda, MD;  [facility];  Service: Endoscopy;  Laterality: N/A;   FLEXIBLE SIGMOIDOSCOPY N/A 01/19/2017   Procedure: FLEXIBLE SIGMOIDOSCOPY;  Surgeon: Golda Claudis PENNER, MD;  [facility];  Service:  Endoscopy;  Laterality: N/A;  1200   FLEXIBLE SIGMOIDOSCOPY N/A 01/15/2018   Procedure: FLEXIBLE SIGMOIDOSCOPY;  Surgeon: Golda Claudis PENNER, MD;  [facility];  Service: Endoscopy;  Laterality: N/A;  9:30   FLEXIBLE SIGMOIDOSCOPY N/A 10/04/2019   Procedure: FLEXIBLE SIGMOIDOSCOPY;  Surgeon: Golda Claudis PENNER, MD;  [facility];  Service: Endoscopy;  Laterality: N/A;  730   FLEXIBLE SIGMOIDOSCOPY N/A 09/30/2020   Procedure: FLEXIBLE SIGMOIDOSCOPY;  Surgeon: Golda Claudis PENNER, MD;  [facility];  Service: Endoscopy;  Laterality: N/A;  10:05    FLEXIBLE SIGMOIDOSCOPY N/A 11/02/2022   Procedure: FLEXIBLE SIGMOIDOSCOPY;  Surgeon: Eartha Angelia Sieving, MD;  [facility];  Service: Gastroenterology;  Laterality: N/A;  10:30AM;ASA 1   HOLMIUM LASER APPLICATION Left 07/25/2014   Procedure: HOLMIUM LASER APPLICATION, LEFT ONLY ;  Surgeon: Ricardo Likens, MD;  Location: [facility];  Service: Urology;  Laterality: Left;   ILEOSTOMY  02/28/2012   Procedure: ILEOSTOMY;  Surgeon: Thresa JAYSON Pulling, MD;  [facility]: [facility];  Service: General;  Laterality: N/A;   ILEOSTOMY CLOSURE N/A 05/27/2013   Procedure: ILEOSTOMY TAKEDOWN WITH ILEOPROCTOSTOMY;  Surgeon: Morene ONEIDA Olives, MD;  Location: [facility];  Service: General;  Laterality: N/A;   LAPAROTOMY  02/27/2012   Procedure: EXPLORATORY LAPAROTOMY;  Surgeon: Thresa JAYSON Pulling, MD;  [facility]: [facility];  Service: General;  Laterality: N/A;   LAPAROTOMY N/A 03/26/2012   Procedure: EXPLORATORY LAPAROTOMY;  Surgeon: Morene ONEIDA Olives, MD;  [facility]: [facility];  Service: General;  Laterality: N/A;   LAPAROTOMY N/A 03/29/2012   Procedure: EXPLORATORY LAPAROTOMY, PARTIAL WOUND CLOSURE;  Surgeon: Morene ONEIDA Olives, MD;  [facility]: [facility];  Service: General;  Laterality: N/A;   LIVER BIOPSY N/A 03/26/2012   Procedure: LIVER BIOPSY;  Surgeon: Morene ONEIDA Olives, MD;  [facility];  Service: General;  Laterality: N/A;   POLYPECTOMY  10/04/2019   Procedure: POLYPECTOMY;  Surgeon: Golda Claudis PENNER, MD;  [facility];  Service: Endoscopy;;   SIGMOID COLECTOMY /  LOW ANTERIOR RESECTION/  END SIGMOID COLOSTOMY  11-07-2010   [place]   Sigmoid colonic perferation---  TAKE DOWN COLOSTOMY,  Jan 2013   TRANSTHORACIC ECHOCARDIOGRAM  03-26-2012  (in setting of septic shock)   report states  poorly visualized--  normal LVSF, ef 50-55% and no pericardial effusion   VACUUM ASSISTED CLOSURE CHANGE N/A 03/29/2012   Procedure: Open ABDOMINAL VACUUM  CHANGE;  Surgeon: Morene ONEIDA Olives, MD;  [facility];  Service: General;  Laterality: N/A;   VACUUM ASSISTED CLOSURE CHANGE N/A 04/01/2012   Procedure: removal of abdominal vac dressing and abdominal closure;  Surgeon: Morene ONEIDA Olives, MD;  Location: [facility];  Service: General;  Laterality: N/A;    Family History: Family History  Problem Relation Age of Onset   Cancer Sister    Healthy Daughter    Hypertension Mother    Colon cancer Neg Hx     Social History: Social History   Tobacco Use  Smoking Status Former   Current packs/day: 0.00   Average packs/day: 0.5 packs/day for 12.0 years (6.0 ttl pk-yrs)   Types: Cigarettes   Start date: 07/17/1972   Quit date: 07/17/1984   Years since quitting: 39.3   Passive exposure: Never  Smokeless Tobacco Never   Social History   Substance and Sexual Activity  Alcohol Use No   Alcohol/week: 0.0 standard drinks of alcohol   Social History   Substance and Sexual  Activity  Drug Use No    Allergies: Allergies  Allergen Reactions   Penicillins Other (See Comments)    Heart rate slowed down  bradycardia DID THE REACTION INVOLVE: Swelling of the face/tongue/throat, SOB, or low BP? Yes Sudden or severe rash/hives, skin peeling, or the inside of the mouth or nose? No Did it require medical treatment? Yes When did it last happen?      years  If all above answers are "NO", may proceed with cephalosporin use.     Clindamycin /Lincomycin Diarrhea    C-diff   Morphine  And Codeine Nausea And Vomiting   Claritin  [Loratadine ] Rash   Contrast Media [Iodinated Contrast Media] Hives    Patient reports hives after IV contrast.    Primaxin  [Imipenem ] Rash    Medications: Current Outpatient Medications  Medication Sig Dispense Refill   acetaminophen  (TYLENOL ) 500 MG tablet Take 1,000 mg by mouth every 6 (six) hours as needed for moderate pain or headache.      Calcium  Citrate (CITRACAL PO) Take by mouth. (Patient taking differently: Take by mouth daily at 6 (six) AM.)      diphenhydrAMINE  (BENADRYL ) 25 MG tablet Take 25 mg by mouth daily as needed. Take before Avsola  infusion     ferrous sulfate  325 (65 FE) MG tablet Take 325 mg by mouth 2 (two) times daily with a meal.     fluticasone  (FLONASE ) 50 MCG/ACT nasal spray Place 1 spray into both nostrils daily as needed for allergies or rhinitis. (Patient taking differently: Place 1 spray into both nostrils as needed for allergies or rhinitis.)     inFLIXimab -axxq (AVSOLA ) 100 MG injection Inject into the vein. (Patient taking differently: Inject into the vein. Every six weeks.)     mesalamine  (CANASA ) 1000 MG suppository Place 1 suppository (1,000 mg total) rectally at bedtime. 30 suppository 3   Multiple Vitamin (MULTIVITAMIN WITH MINERALS) TABS tablet Take 1 tablet by mouth daily.     vitamin C (ASCORBIC ACID) 500 MG tablet Take 500 mg by mouth daily.     No current facility-administered medications for this visit.    Review of Systems: GENERAL: negative for malaise, night sweats HEENT: No changes in hearing or vision, no nose bleeds or other nasal problems. NECK: Negative for lumps, goiter, pain and significant neck swelling RESPIRATORY: Negative for cough, wheezing CARDIOVASCULAR: Negative for chest pain, leg swelling, palpitations, orthopnea GI: SEE HPI MUSCULOSKELETAL: Negative for joint pain or swelling, back pain, and muscle pain. SKIN: Negative for lesions, rash PSYCH: Negative for sleep disturbance, mood disorder and recent psychosocial stressors. HEMATOLOGY Negative for prolonged bleeding, bruising easily, and swollen nodes. ENDOCRINE: Negative for cold or heat intolerance, polyuria, polydipsia and goiter. NEURO: negative for tremor, gait imbalance, syncope and seizures. The remainder of the review of systems is noncontributory.   Physical Exam: BP 127/75 (BP Location: Left Arm, Patient Position: Sitting, Cuff Size: Normal)   Pulse (!) 59   Temp 97.8 F (36.6 C) (Temporal)   Ht 5' 10 (1.778 m)    Wt 186 lb 12.8 oz (84.7 kg)   BMI 26.80 kg/m  GENERAL: The patient is AO x3, in no acute distress. HEENT: Head is normocephalic and atraumatic. EOMI are intact. Mouth is well hydrated and without lesions. NECK: Supple. No masses LUNGS: Clear to auscultation. No presence of rhonchi/wheezing/rales. Adequate chest expansion HEART: RRR, normal s1 and s2. ABDOMEN: Soft, nontender, no guarding, no peritoneal signs, and nondistended. BS +. No masses. EXTREMITIES: Without any cyanosis, clubbing, rash, lesions or  edema. NEUROLOGIC: AOx3, no focal motor deficit. SKIN: no jaundice, no rashes  Imaging/Labs: as above  I personally reviewed and interpreted the available labs, imaging and endoscopic files.  Impression and Plan: Connor Small is a 63 y.o. 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  Patient has presented significant symptom control with the combination of Avsola  and mesalamine  suppositories.  Denies any ongoing complaints at the moment.  He appears to be in endoscopic remission with this combination of therapy.  He is due for repeat flexible sigmoidoscopy, which will be performed for preventative purposes.  She will be scheduled for tomorrow.  For now, the patient will be continuing the same doses of Avsola  and mesalamine .  Repeat surveillance labs will be ordered today.  Finally, he is due for shingles vaccination, I encouraged him to proceed with this.  -Proceed with flexible sigmoidoscopy tomorrow -Continue mesalamine  suppositories overnight -Continue Avsola  every 6 weeks -Please obtain shingles shot - Check CBC, CMP and CRP.  All questions were answered.      Connor Fortune, MD Gastroenterology and Hepatology [facility]

## 2023-11-13 NOTE — Patient Instructions (Addendum)
 Proceed with flexible sigmoidoscopy tomorrow Continue mesalamine  suppositories overnight Continue Avsola  every 6 weeks Please obtain shingles shot Perform blood workup

## 2023-11-14 ENCOUNTER — Ambulatory Visit (HOSPITAL_COMMUNITY): Admitting: Anesthesiology

## 2023-11-14 ENCOUNTER — Encounter (HOSPITAL_COMMUNITY)
Admission: RE | Disposition: A | Discharge status: Home / Self Care | Payer: Self-pay | Source: Home / Self Care | Attending: Gastroenterology

## 2023-11-14 ENCOUNTER — Encounter (HOSPITAL_COMMUNITY): Payer: Self-pay | Admitting: Gastroenterology

## 2023-11-14 ENCOUNTER — Other Ambulatory Visit: Payer: Self-pay

## 2023-11-14 ENCOUNTER — Ambulatory Visit (HOSPITAL_COMMUNITY)
Admission: RE | Admit: 2023-11-14 | Discharge: 2023-11-14 | Disposition: A | Discharge status: Home / Self Care | Attending: Gastroenterology | Admitting: Gastroenterology

## 2023-11-14 DIAGNOSIS — Z87891 Personal history of nicotine dependence: Secondary | ICD-10-CM | POA: Diagnosis not present

## 2023-11-14 DIAGNOSIS — K5100 Ulcerative (chronic) pancolitis without complications: Secondary | ICD-10-CM | POA: Insufficient documentation

## 2023-11-14 DIAGNOSIS — Z86718 Personal history of other venous thrombosis and embolism: Secondary | ICD-10-CM | POA: Diagnosis not present

## 2023-11-14 DIAGNOSIS — I10 Essential (primary) hypertension: Secondary | ICD-10-CM | POA: Diagnosis not present

## 2023-11-14 DIAGNOSIS — Z980 Intestinal bypass and anastomosis status: Secondary | ICD-10-CM | POA: Diagnosis not present

## 2023-11-14 DIAGNOSIS — Z9049 Acquired absence of other specified parts of digestive tract: Secondary | ICD-10-CM | POA: Insufficient documentation

## 2023-11-14 DIAGNOSIS — K5120 Ulcerative (chronic) proctitis without complications: Secondary | ICD-10-CM

## 2023-11-14 DIAGNOSIS — Z1211 Encounter for screening for malignant neoplasm of colon: Secondary | ICD-10-CM | POA: Insufficient documentation

## 2023-11-14 DIAGNOSIS — I480 Paroxysmal atrial fibrillation: Secondary | ICD-10-CM | POA: Diagnosis not present

## 2023-11-14 HISTORY — PX: FLEXIBLE SIGMOIDOSCOPY: SHX5431

## 2023-11-14 SURGERY — SIGMOIDOSCOPY, FLEXIBLE
Anesthesia: General

## 2023-11-14 MED ORDER — LACTATED RINGERS IV SOLN: INTRAVENOUS | Status: DC | PRN

## 2023-11-14 MED ORDER — LIDOCAINE 2% (20 MG/ML) 5 ML SYRINGE
INTRAMUSCULAR | Status: DC | PRN
  Administered 2023-11-14: 60 mg via INTRAVENOUS

## 2023-11-14 MED ORDER — PROPOFOL 500 MG/50ML IV EMUL
INTRAVENOUS | Status: DC | PRN
  Administered 2023-11-14: 125 ug/kg/min via INTRAVENOUS
  Administered 2023-11-14: 70 mg via INTRAVENOUS

## 2023-11-14 NOTE — Op Note (Addendum)
 [facility] Patient Name: Connor Small Procedure Date: 11/14/2023 8:05 AM MRN: [id number] Date of Birth: [birth year] Attending MD: Toribio Fortune , , [id number] CSN: [id number] Age: 63 Admit Type: Outpatient Procedure:                Flexible Sigmoidoscopy Indications:              High risk colon cancer surveillance: Ulcerative                            pancolitis of 8 (or more) years duration Providers:                Toribio Fortune, Crystal Page, Daphne Mulch                            Technician, Technician Referring MD:              Medicines:                Monitored Anesthesia Care Complications:            No immediate complications. Estimated Blood Loss:     Estimated blood loss: none. Procedure:                Pre-Anesthesia Assessment:                           - Prior to the procedure, a History and Physical                            was performed, and patient medications, allergies                            and sensitivities were reviewed. The patient's                            tolerance of previous anesthesia was reviewed.                           - The risks and benefits of the procedure and the                            sedation options and risks were discussed with the                            patient. All questions were answered and informed                            consent was obtained.                           - ASA Grade Assessment: II - A patient with mild                            systemic disease.                           After obtaining informed consent, the scope was  passed under direct vision. The [id number]                            ([id number])Leezm was introduced through the anus and                            advanced to the the ileo-sigmoid anastomosis. The                            flexible sigmoidoscopy was accomplished without                            difficulty. The patient tolerated the procedure                             well. The quality of the bowel preparation was                            adequate. Scope In: 8:22:41 AM Scope Out: 8:27:04 AM Total Procedure Duration: 0 hours 4 minutes 23 seconds  Findings:      The perianal and digital rectal examinations were normal.      The neo terminal ileum appeared normal.      There was evidence of a prior end-to-side ileo-colonic anastomosis in       the recto-sigmoid colon. This was patent and was characterized by       healthy appearing mucosa. The anastomosis was traversed. Previous       inflammation had resolved.      J-pouch appeared to be healthy.      Inflammation was found in the distal rectum. This was graded as Mayo       Score 1 (mild, with erythema, decreased vascular pattern, mild       friability), and when compared to the previous examination, the findings       are improved. These findings of inflammation were minimal.      Normal retroflexion otherwise. Impression:               - The terminal ileum is normal.                           - Patent end-to-side ileo-colonic anastomosis,                            characterized by healthy appearing mucosa.                           - Mild (Mayo Score 1) proctitis ulcerative colitis,                            improved since the last examination.                           - No specimens collected. Moderate Sedation:      Per Anesthesia Care Recommendation:           - Discharge patient to home (ambulatory).                           -  Resume previous diet.                           - Continue present medications.                           - Await pathology results.                           - Repeat flexible sigmoidoscopy in 2 years for                            surveillance. Procedure Code(s):        --- Professional ---                           G0104, Colorectal cancer screening; flexible                            sigmoidoscopy Diagnosis Code(s):        ---  Professional ---                           K51.00, Ulcerative (chronic) pancolitis without                            complications                           Z98.0, Intestinal bypass and anastomosis status                           K51.20, Ulcerative (chronic) proctitis without                            complications CPT copyright 2022 American Medical Association. All rights reserved. The codes documented in this report are preliminary and upon coder review may  be revised to meet current compliance requirements. Toribio Fortune, MD Toribio Fortune,  11/14/2023 8:38:38 AM This report has been signed electronically. Number of Addenda: 0

## 2023-11-14 NOTE — Discharge Instructions (Signed)
 You are being discharged to home.  Resume your previous diet.  Continue your present medications.  We are waiting for your pathology results.

## 2023-11-14 NOTE — Anesthesia Preprocedure Evaluation (Signed)
 Anesthesia Evaluation  Patient identified by MRN, date of birth, ID band Patient awake    Reviewed: Allergy & Precautions, H&P , NPO status , Patient's Chart, lab work & pertinent test results, reviewed documented beta blocker date and time   Airway Mallampati: II  TM Distance: >3 FB Neck ROM: full    Dental no notable dental hx.    Pulmonary neg pulmonary ROS, former smoker   Pulmonary exam normal breath sounds clear to auscultation       Cardiovascular Exercise Tolerance: Good hypertension, negative cardio ROS + dysrhythmias Atrial Fibrillation  Rhythm:regular Rate:Normal     Neuro/Psych negative neurological ROS  negative psych ROS   GI/Hepatic negative GI ROS, Neg liver ROS,,,  Endo/Other  negative endocrine ROS    Renal/GU negative Renal ROS  negative genitourinary   Musculoskeletal   Abdominal   Peds  Hematology negative hematology ROS (+)   Anesthesia Other Findings   Reproductive/Obstetrics negative OB ROS                              Anesthesia Physical Anesthesia Plan  ASA: 3  Anesthesia Plan: General   Post-op Pain Management:    Induction:   PONV Risk Score and Plan: Propofol  infusion  Airway Management Planned:   Additional Equipment:   Intra-op Plan:   Post-operative Plan:   Informed Consent: I have reviewed the patients History and Physical, chart, labs and discussed the procedure including the risks, benefits and alternatives for the proposed anesthesia with the patient or authorized representative who has indicated his/her understanding and acceptance.     Dental Advisory Given  Plan Discussed with: CRNA  Anesthesia Plan Comments:         Anesthesia Quick Evaluation

## 2023-11-14 NOTE — Transfer of Care (Addendum)
 Immediate Anesthesia Transfer of Care Note  Patient: Connor Small  Procedure(s) Performed: [place] [facility]  Patient Location: Short Stay  Anesthesia Type:General  Level of Consciousness: drowsy and patient cooperative  Airway & Oxygen Therapy: Patient Spontanous Breathing  Post-op Assessment: Report given to RN and Post -op Vital signs reviewed and stable  Post vital signs: Reviewed and stable  Last Vitals:  Vitals Value Taken Time  BP 96/52 11/14/23   0835  Temp 36.5 11/14/23   0835  Pulse 55 11/14/23   0835  Resp 11 11/14/23   0835  SpO2 100% 11/14/23   0835    Last Pain:  Vitals:   11/14/23 0815  TempSrc:   PainSc: 0-No pain         Complications: No notable events documented.

## 2023-11-14 NOTE — Interval H&P Note (Signed)
 History and Physical Interval Note:  11/14/2023 7:38 AM  Connor Small  has presented today for surgery, with the diagnosis of screening.  The various methods of treatment have been discussed with the patient and family. After consideration of risks, benefits and other options for treatment, the patient has consented to  Procedure(s) with comments: SIGMOIDOSCOPY, FLEXIBLE (N/A) - 8:30am, ASA 3 as a surgical intervention.  The patient's history has been reviewed, patient examined, no change in status, stable for surgery.  I have reviewed the patient's chart and labs.  Questions were answered to the patient's satisfaction.     Ramla Hase Castaneda Mayorga

## 2023-11-14 NOTE — Anesthesia Procedure Notes (Signed)
 Date/Time: 11/14/2023 8:16 AM  Performed by: Para Jerelene CROME, CRNAOxygen Delivery Method: Nasal cannula

## 2023-11-15 ENCOUNTER — Ambulatory Visit (INDEPENDENT_AMBULATORY_CARE_PROVIDER_SITE_OTHER): Payer: Self-pay | Admitting: Gastroenterology

## 2023-11-15 ENCOUNTER — Encounter (HOSPITAL_COMMUNITY): Payer: Self-pay | Admitting: Gastroenterology

## 2023-11-15 LAB — SURGICAL PATHOLOGY

## 2023-11-15 NOTE — Progress Notes (Signed)
 2 yr flex sig noted in recall Patient result letter mailed procedure note and pathology result faxed to PCP

## 2023-11-17 NOTE — Anesthesia Postprocedure Evaluation (Signed)
 Anesthesia Post Note  Patient: Connor Small  Procedure(s) Performed: [facility] [facility]  Patient location during evaluation: Phase II Anesthesia Type: General Level of consciousness: awake Pain management: pain level controlled Vital Signs Assessment: post-procedure vital signs reviewed and stable Respiratory status: spontaneous breathing and respiratory function stable Cardiovascular status: blood pressure returned to baseline and stable Postop Assessment: no headache and no apparent nausea or vomiting Anesthetic complications: no Comments: Late entry   No notable events documented.   Last Vitals:  Vitals:   11/14/23 0835 11/14/23 0839  BP: (!) 96/52 (!) 106/54  Pulse: (!) 55   Resp: 11   Temp: 36.5 C   SpO2: 100%     Last Pain:  Vitals:   11/14/23 0835  TempSrc: Axillary  PainSc: 0-No pain                 Connor Small

## 2023-11-21 ENCOUNTER — Ambulatory Visit (INDEPENDENT_AMBULATORY_CARE_PROVIDER_SITE_OTHER): Payer: Self-pay | Admitting: Gastroenterology

## 2023-11-21 LAB — COMPREHENSIVE METABOLIC PANEL WITH GFR
AG Ratio: 1.7 (calc) (ref 1.0–2.5)
ALT: 19 U/L (ref 9–46)
AST: 25 U/L (ref 10–35)
Albumin: 3.8 g/dL (ref 3.6–5.1)
Alkaline phosphatase (APISO): 75 U/L (ref 35–144)
BUN/Creatinine Ratio: 16 (calc) (ref 6–22)
BUN: 22 mg/dL (ref 7–25)
CO2: 26 mmol/L (ref 20–32)
Calcium: 8.2 mg/dL — ABNORMAL LOW (ref 8.6–10.3)
Chloride: 109 mmol/L (ref 98–110)
Creat: 1.38 mg/dL — ABNORMAL HIGH (ref 0.70–1.35)
Globulin: 2.2 g/dL (ref 1.9–3.7)
Glucose, Bld: 90 mg/dL (ref 65–99)
Potassium: 4 mmol/L (ref 3.5–5.3)
Sodium: 139 mmol/L (ref 135–146)
Total Bilirubin: 0.5 mg/dL (ref 0.2–1.2)
Total Protein: 6 g/dL — ABNORMAL LOW (ref 6.1–8.1)
eGFR: 57 mL/min/1.73m2 — ABNORMAL LOW (ref >=60)

## 2023-11-21 LAB — CBC WITH DIFFERENTIAL/PLATELET
Absolute Lymphocytes: 1640 {cells}/uL (ref 850–3900)
Absolute Monocytes: 730 {cells}/uL (ref 200–950)
Basophils Absolute: 40 {cells}/uL (ref 0–200)
Basophils Relative: 0.8 %
Eosinophils Absolute: 130 {cells}/uL (ref 15–500)
Eosinophils Relative: 2.6 %
HCT: 40.2 % (ref 38.5–50.0)
Hemoglobin: 14 g/dL (ref 13.2–17.1)
MCH: 34.1 pg — ABNORMAL HIGH (ref 27.0–33.0)
MCHC: 34.8 g/dL (ref 32.0–36.0)
MCV: 98 fL (ref 80.0–100.0)
MPV: 9.7 fL (ref 7.5–12.5)
Monocytes Relative: 14.6 %
Neutro Abs: 2460 {cells}/uL (ref 1500–7800)
Neutrophils Relative %: 49.2 %
Platelets: 177 Thousand/uL (ref 140–400)
RBC: 4.1 Million/uL — ABNORMAL LOW (ref 4.20–5.80)
RDW: 12.5 % (ref 11.0–15.0)
Total Lymphocyte: 32.8 %
WBC: 5 Thousand/uL (ref 3.8–10.8)

## 2023-11-21 LAB — C-REACTIVE PROTEIN: CRP: <3 mg/L (ref <8.0)

## 2023-12-01 ENCOUNTER — Encounter: Attending: Gastroenterology | Admitting: Emergency Medicine

## 2023-12-01 VITALS — BP 145/74 | HR 53 | Temp 97.8°F | Resp 16 | Wt 186.7 lb

## 2023-12-01 DIAGNOSIS — K51019 Ulcerative (chronic) pancolitis with unspecified complications: Secondary | ICD-10-CM | POA: Insufficient documentation

## 2023-12-01 MED ORDER — ACETAMINOPHEN 325 MG PO TABS
650.0000 mg | ORAL_TABLET | Freq: Once | ORAL | Status: AC
  Administered 2023-12-01: 650 mg via ORAL

## 2023-12-01 MED ORDER — METHYLPREDNISOLONE SODIUM SUCC 40 MG IJ SOLR
40.0000 mg | Freq: Once | INTRAMUSCULAR | Status: AC
  Administered 2023-12-01: 40 mg via INTRAVENOUS

## 2023-12-01 MED ORDER — SODIUM CHLORIDE 0.9 % IV SOLN
5.0000 mg/kg | Freq: Once | INTRAVENOUS | Status: AC
  Administered 2023-12-01: 400 mg via INTRAVENOUS
  Filled 2023-12-01: qty 40

## 2023-12-01 MED ORDER — DIPHENHYDRAMINE HCL 25 MG PO CAPS
25.0000 mg | ORAL_CAPSULE | Freq: Once | ORAL | Status: AC
  Administered 2023-12-01: 25 mg via ORAL

## 2023-12-01 NOTE — Progress Notes (Signed)
 Diagnosis: Ulcerative Colitis   Provider:  Eartha Sieving MD  Procedure: IV Infusion  IV Type: Peripheral, IV Location: R Wrist   Avsola  (infliximab -axxq), Dose: 400 mg  Infusion Start Time: 1033  Infusion Stop Time: 1143  Post Infusion IV Care: Peripheral IV Discontinued  Discharge: Condition: Good, Destination: Home . AVS Declined  Performed by:  Delon ONEIDA Officer, RN

## 2024-01-12 ENCOUNTER — Ambulatory Visit: Attending: Gastroenterology

## 2024-01-12 VITALS — BP 161/84 | HR 54 | Temp 97.9°F | Resp 14 | Wt 186.7 lb

## 2024-01-12 DIAGNOSIS — K51019 Ulcerative (chronic) pancolitis with unspecified complications: Secondary | ICD-10-CM | POA: Insufficient documentation

## 2024-01-12 MED ORDER — ACETAMINOPHEN 325 MG PO TABS: 650.0000 mg | ORAL_TABLET | Freq: Once | ORAL | Status: DC

## 2024-01-12 MED ORDER — DIPHENHYDRAMINE HCL 25 MG PO CAPS: 25.0000 mg | ORAL_CAPSULE | Freq: Once | ORAL | Status: DC

## 2024-01-12 MED ORDER — SODIUM CHLORIDE 0.9 % IV SOLN
5.0000 mg/kg | Freq: Once | INTRAVENOUS | Status: AC
  Administered 2024-01-12: 400 mg via INTRAVENOUS
  Filled 2024-01-12: qty 40

## 2024-01-12 MED ORDER — METHYLPREDNISOLONE SODIUM SUCC 40 MG IJ SOLR
40.0000 mg | Freq: Once | INTRAMUSCULAR | Status: AC
  Administered 2024-01-12: 40 mg via INTRAVENOUS

## 2024-01-12 NOTE — Progress Notes (Signed)
 Diagnosis:  Ulcerative Colitis  Provider:  Eartha Sieving MD  Procedure: IV Infusion  IV Type: Peripheral, IV Location: Left Posterior hand  Avsola  (infliximab -axxq), Dose: 400 mg  Infusion Start Time: 1038  Infusion Stop Time: 1141  Post Infusion IV Care: Observation period completed  Discharge: Condition: Good, Destination: Home . AVS Provided  Performed by:  Blanca Selinda SAUNDERS, LPN

## 2024-01-25 ENCOUNTER — Telehealth: Payer: Self-pay

## 2024-01-25 NOTE — Telephone Encounter (Addendum)
 Auth Submission: APPROVED Site of care: Site of care: [facility] Payer: [employer] ppo Medication & CPT/J Code(s) submitted: Avsola  (infliximab -axxq) [id number] Diagnosis Code:  Route of submission (phone, fax, portal): portal Phone # Fax # Auth type: Buy/Bill PB Units/visits requested: 5mg /kg, q6weeks Reference number: [id number] Approval from: 01/26/24 to 01/25/25

## 2024-02-01 ENCOUNTER — Ambulatory Visit: Payer: BC Managed Care – PPO

## 2024-02-23 ENCOUNTER — Encounter: Attending: Gastroenterology | Admitting: Internal Medicine

## 2024-02-23 VITALS — BP 150/90 | HR 50 | Temp 98.0°F | Resp 16 | Wt 186.0 lb

## 2024-02-23 DIAGNOSIS — K51219 Ulcerative (chronic) proctitis with unspecified complications: Secondary | ICD-10-CM | POA: Insufficient documentation

## 2024-02-23 MED ORDER — DIPHENHYDRAMINE HCL 25 MG PO CAPS: 25.0000 mg | ORAL_CAPSULE | Freq: Once | ORAL | Status: DC

## 2024-02-23 MED ORDER — ACETAMINOPHEN 325 MG PO TABS: 650.0000 mg | ORAL_TABLET | Freq: Once | ORAL | Status: DC

## 2024-02-23 MED ORDER — METHYLPREDNISOLONE SODIUM SUCC 40 MG IJ SOLR
40.0000 mg | Freq: Once | INTRAMUSCULAR | Status: AC
  Administered 2024-02-23: 40 mg via INTRAVENOUS

## 2024-02-23 MED ORDER — SODIUM CHLORIDE 0.9 % IV SOLN
5.0000 mg/kg | Freq: Once | INTRAVENOUS | Status: AC
  Administered 2024-02-23: 400 mg via INTRAVENOUS
  Filled 2024-02-23: qty 40

## 2024-02-23 NOTE — Progress Notes (Signed)
 Diagnosis: Ulcerative Colitis  Provider:  Shaaron Charleston MD  Procedure: IV Infusion  IV Type: Peripheral, IV Location: L Upper Arm  , Avsola  (infliximab -axxq), Dose: 400 mg  Infusion Start Time: 1037  Infusion Stop Time: 1145  Post Infusion IV Care: Observation period completed  Discharge: Condition: Good, Destination: Home . AVS Declined  Performed by:  Adrick Kestler R, LPN

## 2024-04-05 ENCOUNTER — Ambulatory Visit

## 2024-04-22 ENCOUNTER — Ambulatory Visit: Admitting: Dermatology

## 2024-10-23 ENCOUNTER — Ambulatory Visit: Admitting: Dermatology
